# Patient Record
Sex: Female | Born: 1965
Health system: Southern US, Community
[De-identification: ages and names within clinical notes are randomized; demographics above are authoritative.]

## PROBLEM LIST (undated history)

## (undated) ENCOUNTER — Emergency Department (HOSPITAL_COMMUNITY): Admission: EM | Source: Home / Self Care

## (undated) DIAGNOSIS — M1990 Unspecified osteoarthritis, unspecified site: Secondary | ICD-10-CM

## (undated) DIAGNOSIS — F419 Anxiety disorder, unspecified: Secondary | ICD-10-CM

## (undated) DIAGNOSIS — Z800 Family history of malignant neoplasm of digestive organs: Secondary | ICD-10-CM

## (undated) DIAGNOSIS — G8929 Other chronic pain: Secondary | ICD-10-CM

## (undated) DIAGNOSIS — I639 Cerebral infarction, unspecified: Secondary | ICD-10-CM

## (undated) DIAGNOSIS — I509 Heart failure, unspecified: Secondary | ICD-10-CM

## (undated) DIAGNOSIS — I10 Essential (primary) hypertension: Secondary | ICD-10-CM

## (undated) DIAGNOSIS — K3184 Gastroparesis: Secondary | ICD-10-CM

## (undated) DIAGNOSIS — Z720 Tobacco use: Secondary | ICD-10-CM

## (undated) DIAGNOSIS — E271 Primary adrenocortical insufficiency: Secondary | ICD-10-CM

## (undated) DIAGNOSIS — I729 Aneurysm of unspecified site: Secondary | ICD-10-CM

## (undated) DIAGNOSIS — H52209 Unspecified astigmatism, unspecified eye: Secondary | ICD-10-CM

## (undated) DIAGNOSIS — D649 Anemia, unspecified: Secondary | ICD-10-CM

## (undated) DIAGNOSIS — S060XAA Concussion with loss of consciousness status unknown, initial encounter: Secondary | ICD-10-CM

## (undated) DIAGNOSIS — R9431 Abnormal electrocardiogram [ECG] [EKG]: Secondary | ICD-10-CM

## (undated) DIAGNOSIS — E2740 Unspecified adrenocortical insufficiency: Secondary | ICD-10-CM

## (undated) DIAGNOSIS — K529 Noninfective gastroenteritis and colitis, unspecified: Secondary | ICD-10-CM

## (undated) DIAGNOSIS — E785 Hyperlipidemia, unspecified: Secondary | ICD-10-CM

## (undated) DIAGNOSIS — M549 Dorsalgia, unspecified: Secondary | ICD-10-CM

## (undated) DIAGNOSIS — R42 Dizziness and giddiness: Secondary | ICD-10-CM

## (undated) DIAGNOSIS — Z9889 Other specified postprocedural states: Secondary | ICD-10-CM

## (undated) DIAGNOSIS — R7303 Prediabetes: Secondary | ICD-10-CM

## (undated) DIAGNOSIS — R519 Headache, unspecified: Secondary | ICD-10-CM

## (undated) DIAGNOSIS — K219 Gastro-esophageal reflux disease without esophagitis: Secondary | ICD-10-CM

## (undated) DIAGNOSIS — Z803 Family history of malignant neoplasm of breast: Secondary | ICD-10-CM

## (undated) DIAGNOSIS — I469 Cardiac arrest, cause unspecified: Secondary | ICD-10-CM

## (undated) DIAGNOSIS — I2510 Atherosclerotic heart disease of native coronary artery without angina pectoris: Secondary | ICD-10-CM

## (undated) DIAGNOSIS — E039 Hypothyroidism, unspecified: Secondary | ICD-10-CM

## (undated) DIAGNOSIS — R112 Nausea with vomiting, unspecified: Secondary | ICD-10-CM

## (undated) DIAGNOSIS — I341 Nonrheumatic mitral (valve) prolapse: Secondary | ICD-10-CM

## (undated) DIAGNOSIS — S060X9A Concussion with loss of consciousness of unspecified duration, initial encounter: Secondary | ICD-10-CM

## (undated) HISTORY — PX: ABDOMINAL HYSTERECTOMY: SHX81

## (undated) HISTORY — PX: CHOLECYSTECTOMY: SHX55

## (undated) HISTORY — PX: COLONOSCOPY: SHX174

## (undated) HISTORY — PX: VARICOSE VEIN SURGERY: SHX832

## (undated) HISTORY — DX: Abnormal electrocardiogram (ECG) (EKG): R94.31

## (undated) HISTORY — DX: Hypothyroidism, unspecified: E03.9

## (undated) HISTORY — DX: Essential (primary) hypertension: I10

## (undated) HISTORY — DX: Dizziness and giddiness: R42

## (undated) HISTORY — DX: Primary adrenocortical insufficiency: E27.1

## (undated) HISTORY — DX: Gastroparesis: K31.84

## (undated) HISTORY — DX: Unspecified adrenocortical insufficiency: E27.40

## (undated) HISTORY — DX: Atherosclerotic heart disease of native coronary artery without angina pectoris: I25.10

## (undated) HISTORY — DX: Family history of malignant neoplasm of digestive organs: Z80.0

## (undated) HISTORY — DX: Family history of malignant neoplasm of breast: Z80.3

## (undated) HISTORY — DX: Dorsalgia, unspecified: M54.9

## (undated) HISTORY — DX: Other chronic pain: G89.29

## (undated) HISTORY — DX: Hyperlipidemia, unspecified: E78.5

## (undated) HISTORY — DX: Anxiety disorder, unspecified: F41.9

## (undated) HISTORY — PX: VESICOVAGINAL FISTULA CLOSURE W/ TAH: SUR271

## (undated) HISTORY — PX: SPINE SURGERY: SHX786

---

## 1998-04-04 ENCOUNTER — Other Ambulatory Visit: Admission: RE | Admit: 1998-04-04 | Discharge: 1998-04-04 | Payer: Self-pay

## 1999-09-07 HISTORY — PX: NECK SURGERY: SHX720

## 1999-09-17 ENCOUNTER — Encounter (INDEPENDENT_AMBULATORY_CARE_PROVIDER_SITE_OTHER): Payer: Self-pay

## 1999-09-17 ENCOUNTER — Inpatient Hospital Stay (HOSPITAL_COMMUNITY): Admission: EM | Admit: 1999-09-17 | Discharge: 1999-09-18 | Payer: Self-pay | Admitting: Emergency Medicine

## 2000-01-20 ENCOUNTER — Encounter: Payer: Self-pay | Admitting: Family Medicine

## 2000-01-20 ENCOUNTER — Ambulatory Visit (HOSPITAL_COMMUNITY): Admission: RE | Admit: 2000-01-20 | Discharge: 2000-01-20 | Payer: Self-pay | Admitting: Family Medicine

## 2000-06-16 ENCOUNTER — Emergency Department (HOSPITAL_COMMUNITY): Admission: EM | Admit: 2000-06-16 | Discharge: 2000-06-16 | Payer: Self-pay | Admitting: Emergency Medicine

## 2000-06-16 ENCOUNTER — Encounter: Payer: Self-pay | Admitting: Emergency Medicine

## 2000-09-22 ENCOUNTER — Encounter: Admission: RE | Admit: 2000-09-22 | Discharge: 2000-09-22 | Payer: Self-pay | Admitting: Family Medicine

## 2000-09-22 ENCOUNTER — Encounter: Payer: Self-pay | Admitting: Family Medicine

## 2000-10-13 ENCOUNTER — Ambulatory Visit (HOSPITAL_COMMUNITY): Admission: RE | Admit: 2000-10-13 | Discharge: 2000-10-13 | Payer: Self-pay | Admitting: Obstetrics and Gynecology

## 2000-11-28 ENCOUNTER — Encounter: Payer: Self-pay | Admitting: Neurosurgery

## 2000-11-28 ENCOUNTER — Inpatient Hospital Stay (HOSPITAL_COMMUNITY): Admission: RE | Admit: 2000-11-28 | Discharge: 2000-11-29 | Payer: Self-pay | Admitting: Neurosurgery

## 2000-12-12 ENCOUNTER — Encounter: Admission: RE | Admit: 2000-12-12 | Discharge: 2000-12-12 | Payer: Self-pay | Admitting: Neurosurgery

## 2000-12-12 ENCOUNTER — Encounter: Payer: Self-pay | Admitting: Neurosurgery

## 2001-01-12 ENCOUNTER — Encounter: Payer: Self-pay | Admitting: Neurosurgery

## 2001-01-12 ENCOUNTER — Encounter: Admission: RE | Admit: 2001-01-12 | Discharge: 2001-01-12 | Payer: Self-pay | Admitting: Neurosurgery

## 2001-06-18 ENCOUNTER — Emergency Department (HOSPITAL_COMMUNITY): Admission: EM | Admit: 2001-06-18 | Discharge: 2001-06-18 | Payer: Self-pay

## 2001-06-18 ENCOUNTER — Encounter: Payer: Self-pay | Admitting: Emergency Medicine

## 2001-11-13 ENCOUNTER — Emergency Department (HOSPITAL_COMMUNITY): Admission: EM | Admit: 2001-11-13 | Discharge: 2001-11-13 | Payer: Self-pay

## 2002-01-03 ENCOUNTER — Encounter: Admission: RE | Admit: 2002-01-03 | Discharge: 2002-01-03 | Payer: Self-pay

## 2002-02-01 ENCOUNTER — Other Ambulatory Visit: Admission: RE | Admit: 2002-02-01 | Discharge: 2002-02-01 | Payer: Self-pay | Admitting: *Deleted

## 2002-04-05 ENCOUNTER — Emergency Department (HOSPITAL_COMMUNITY): Admission: EM | Admit: 2002-04-05 | Discharge: 2002-04-05 | Payer: Self-pay | Admitting: *Deleted

## 2002-04-13 ENCOUNTER — Emergency Department (HOSPITAL_COMMUNITY): Admission: EM | Admit: 2002-04-13 | Discharge: 2002-04-13 | Payer: Self-pay | Admitting: Emergency Medicine

## 2002-04-22 ENCOUNTER — Emergency Department (HOSPITAL_COMMUNITY): Admission: EM | Admit: 2002-04-22 | Discharge: 2002-04-22 | Payer: Self-pay | Admitting: Emergency Medicine

## 2002-10-30 ENCOUNTER — Other Ambulatory Visit: Admission: RE | Admit: 2002-10-30 | Discharge: 2002-10-30 | Payer: Self-pay | Admitting: Internal Medicine

## 2002-11-02 ENCOUNTER — Encounter: Admission: RE | Admit: 2002-11-02 | Discharge: 2002-11-02 | Payer: Self-pay | Admitting: Internal Medicine

## 2002-11-02 ENCOUNTER — Encounter: Payer: Self-pay | Admitting: Internal Medicine

## 2003-01-30 ENCOUNTER — Other Ambulatory Visit: Admission: RE | Admit: 2003-01-30 | Discharge: 2003-01-30 | Payer: Self-pay | Admitting: *Deleted

## 2003-02-27 ENCOUNTER — Emergency Department (HOSPITAL_COMMUNITY): Admission: EM | Admit: 2003-02-27 | Discharge: 2003-02-27 | Payer: Self-pay | Admitting: Emergency Medicine

## 2003-02-27 ENCOUNTER — Encounter: Payer: Self-pay | Admitting: Emergency Medicine

## 2003-12-06 ENCOUNTER — Emergency Department (HOSPITAL_COMMUNITY): Admission: EM | Admit: 2003-12-06 | Discharge: 2003-12-06 | Payer: Self-pay | Admitting: Emergency Medicine

## 2004-01-17 ENCOUNTER — Emergency Department (HOSPITAL_COMMUNITY): Admission: EM | Admit: 2004-01-17 | Discharge: 2004-01-18 | Payer: Self-pay | Admitting: Emergency Medicine

## 2004-01-30 ENCOUNTER — Other Ambulatory Visit: Admission: RE | Admit: 2004-01-30 | Discharge: 2004-01-30 | Payer: Self-pay | Admitting: *Deleted

## 2004-02-10 ENCOUNTER — Emergency Department (HOSPITAL_COMMUNITY): Admission: EM | Admit: 2004-02-10 | Discharge: 2004-02-10 | Payer: Self-pay | Admitting: Emergency Medicine

## 2004-02-28 ENCOUNTER — Emergency Department (HOSPITAL_COMMUNITY): Admission: EM | Admit: 2004-02-28 | Discharge: 2004-02-28 | Payer: Self-pay | Admitting: Emergency Medicine

## 2004-08-04 ENCOUNTER — Encounter: Admission: RE | Admit: 2004-08-04 | Discharge: 2004-08-04 | Payer: Self-pay | Admitting: Internal Medicine

## 2004-08-09 ENCOUNTER — Emergency Department (HOSPITAL_COMMUNITY): Admission: EM | Admit: 2004-08-09 | Discharge: 2004-08-09 | Payer: Self-pay | Admitting: Emergency Medicine

## 2004-08-16 IMAGING — US US PELVIS COMPLETE MODIFY
1 series · 14 of 25 positions shown · non-contrast
Comparison: none

CLINICAL DATA: 37 year old with right lower quadrant abdominal pain.  History of endometriosis.
 TRANSABDOMINAL AND ENDOVAGINAL PELVIC ULTRASOUND 
 Transabdominal and endovaginal images are performed of the pelvis.  The uterus is 6.8 x 3.3 x 3.5 cm.  The endometrium is 8 mm.  Within the endometrium, small hyperechoic foci are identified.  These do not represent calcifications based on the CT examination of the same day.  These could represent focal areas of fibrosis within the canal.  Otherwise the endometrium has a normal appearance.  
 The right ovary is 1.8 x 1.5 x 1.6 cm.  The left ovary is 2.3 x 1.5 x 1.6 cm.  No free pelvic fluid is identified.
 IMPRESSION
 1.  Normal appearing ovaries.  
 2.  Small hyperechoic foci within the endometrial canal may represent focal areas of fibrosis.  No other evidence for an endometrial lesion.

[Series 1: unknown · 0.32mm/px · 14 of 58 slices shown]
[im 1/58]
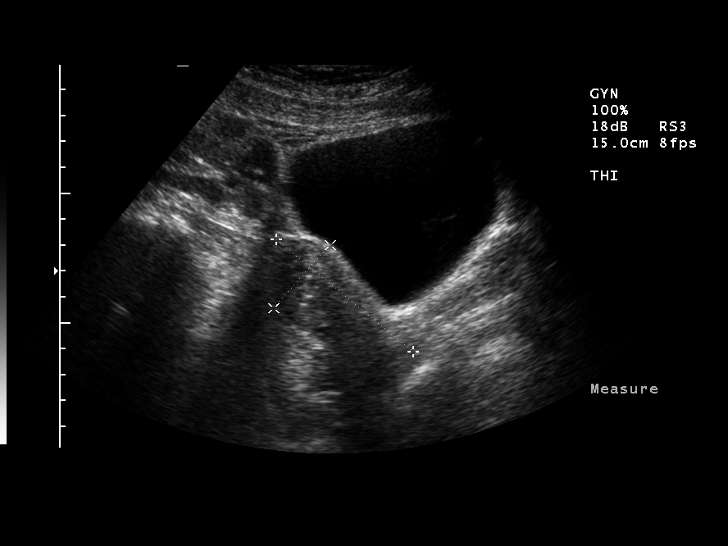
[im 5/58]
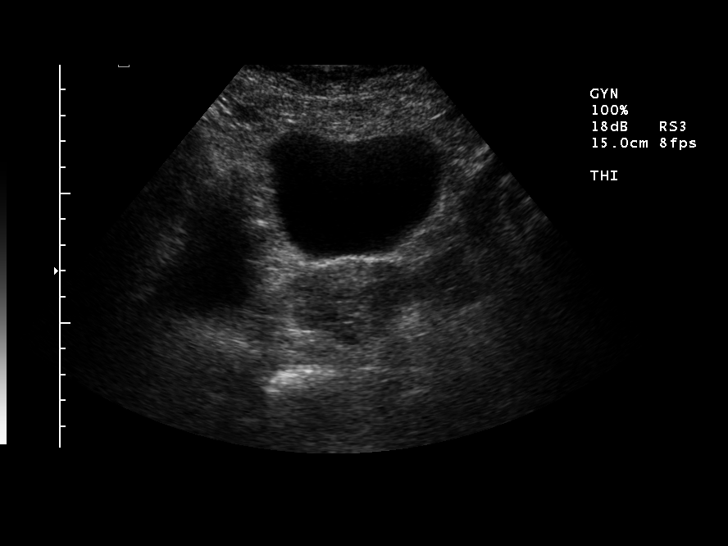
[im 10/58]
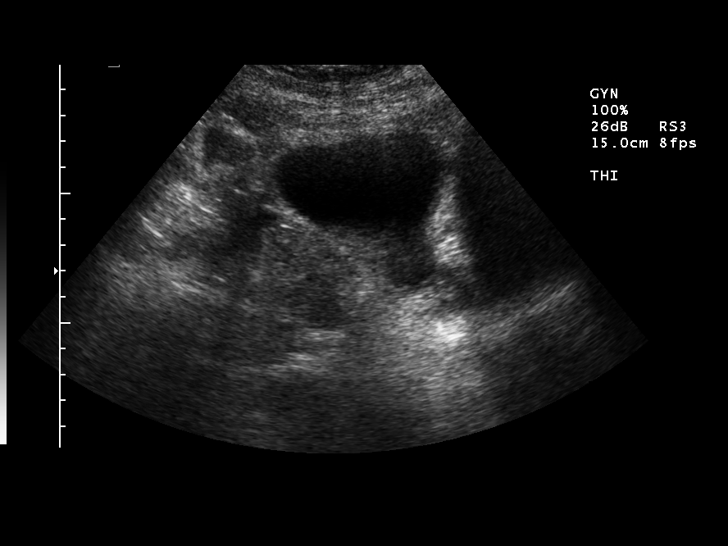
[im 15/58]
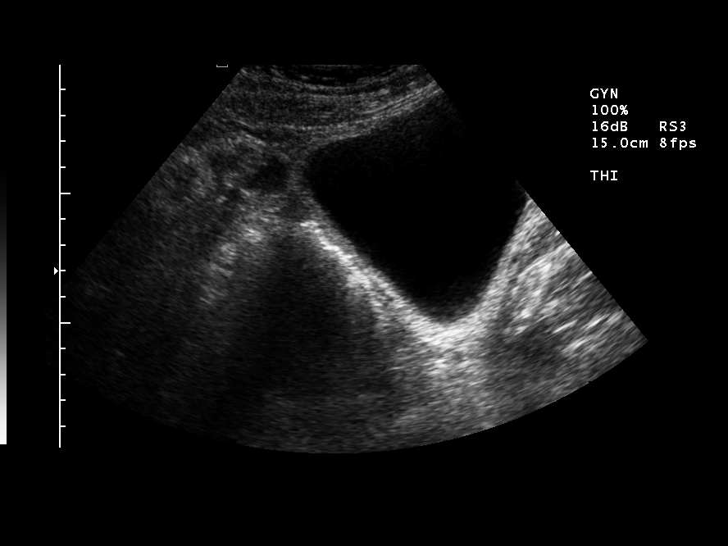
[im 20/58]
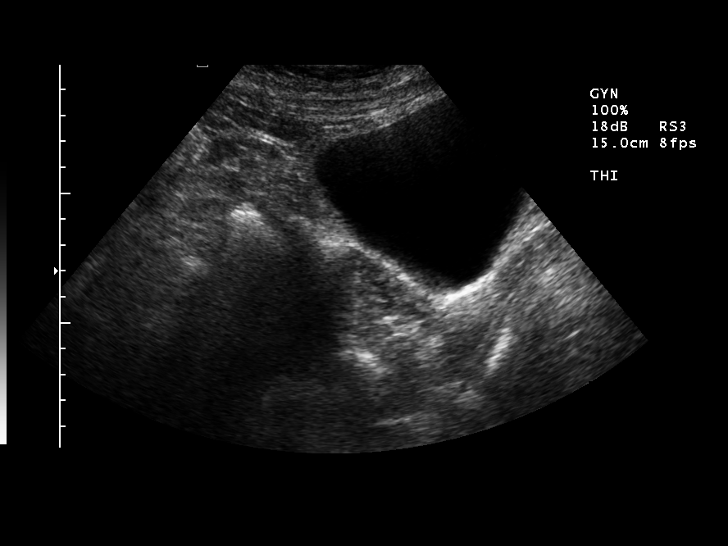
[im 22/58]
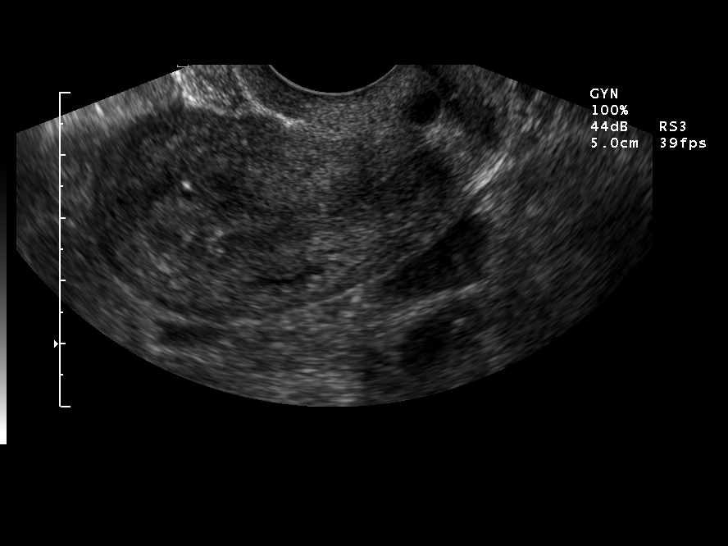
[im 27/58]
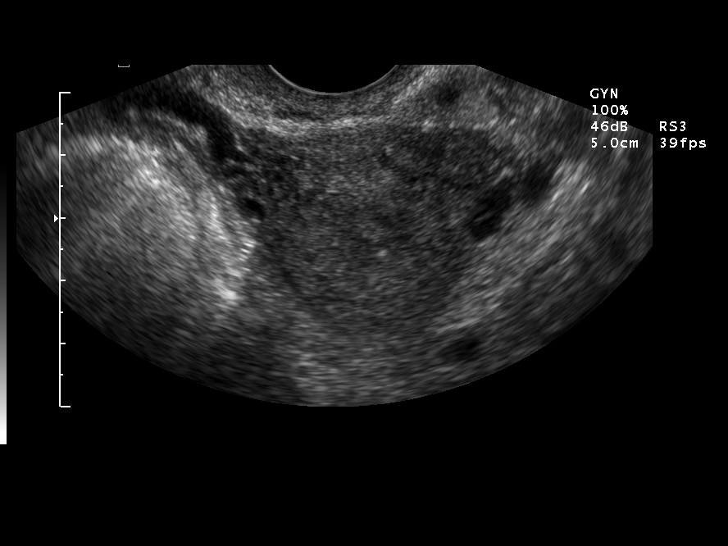
[im 31/58]
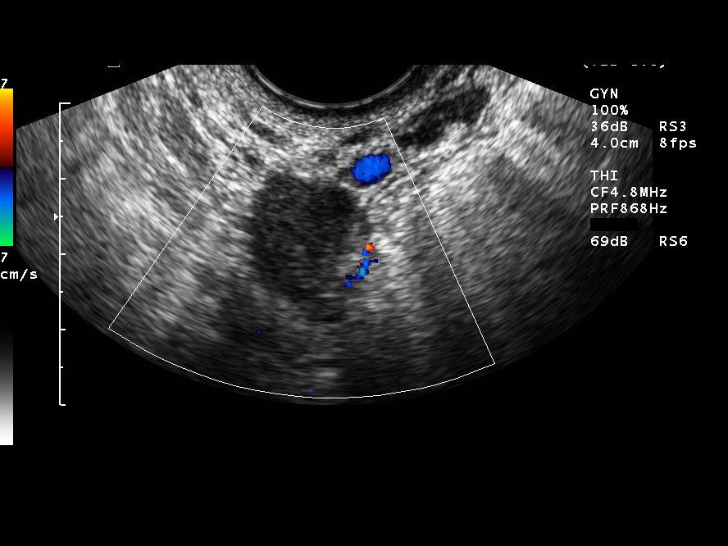
[im 36/58]
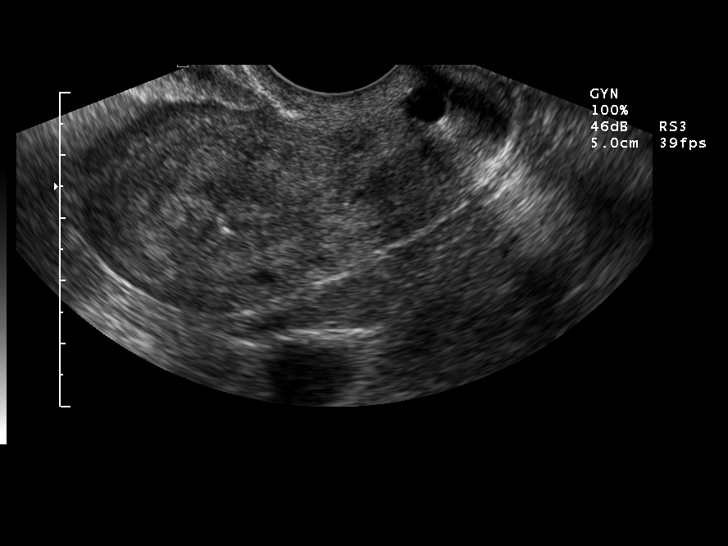
[im 39/58]
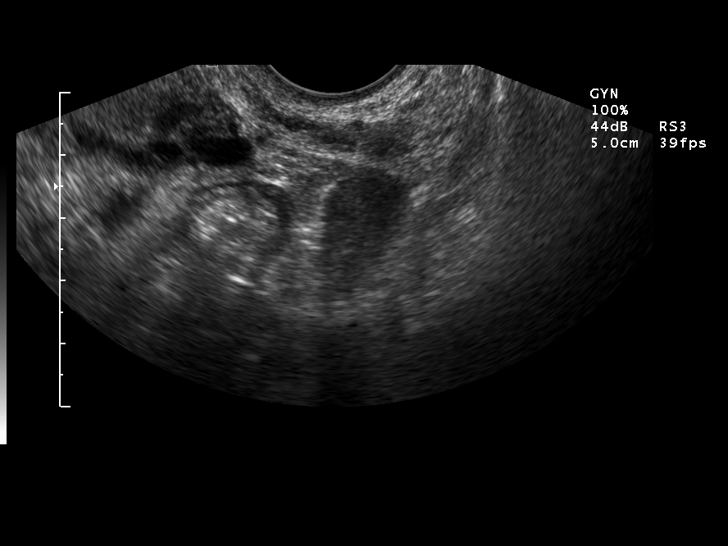
[im 43/58]
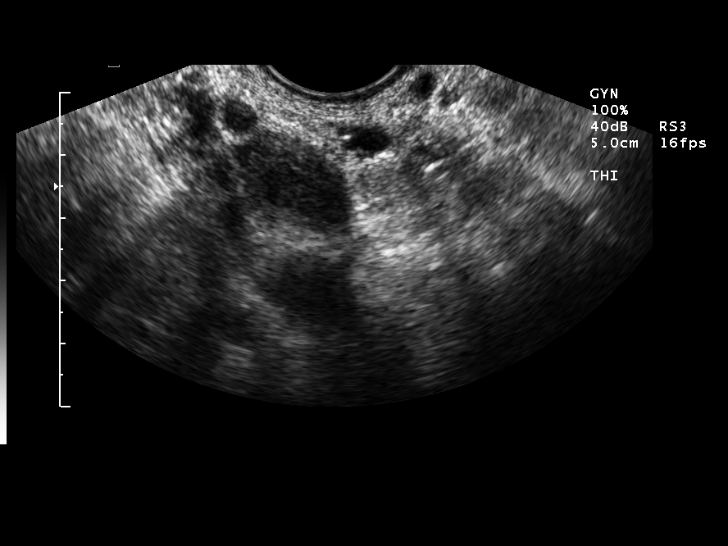
[im 48/58]
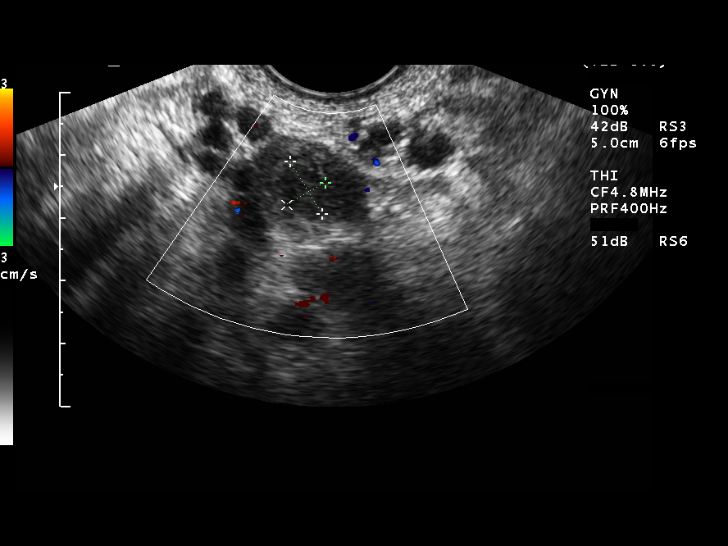
[im 53/58]
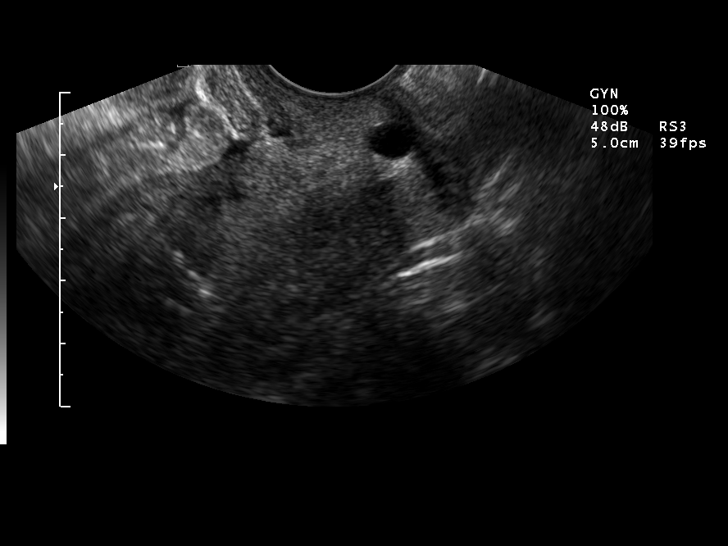
[im 58/58]
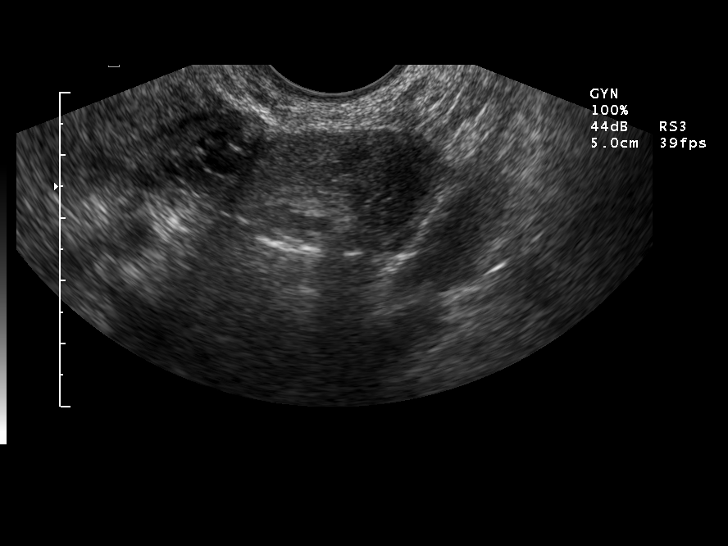

[14 of 25 positions shown; findings below may reference images not displayed]

## 2004-08-16 IMAGING — CT CT ABDOMEN W/O CM
1 of 3 series · 15 of 32 positions shown, 20 images · non-contrast
Comparison: none

CLINICAL DATA: Patient with right abdominal pain.
 CT SCAN OF THE ABDOMEN WITHOUT CONTRAST
 Multiple spiral images were made through abdomen with no oral or IV contrast.  The lung bases are normal.  There is no abnormality of the liver or spleen.  The kidneys are normal with no obstruction.  There is no free air or free fluid.
 IMPRESSION
 Normal CT scan of the abdomen without contrast.
 CT SCAN OF THE PELVIS WITHOUT CONTRAST
 Additional spiral images were made through the pelvis with no oral or IV contrast.  There are no ureteral stones.  The appendix is normal.  No inflammation is noted.
 1.  Normal appendix.
 2.  No acute abnormality within the pelvis.

[Series 3: kidney sto 5.0 b30f · axial · 0.66mm/px · z∈[+1242,+1670]mm · 15 of 119 slices shown, 20 images]
[im 6/119  soft-tissue]
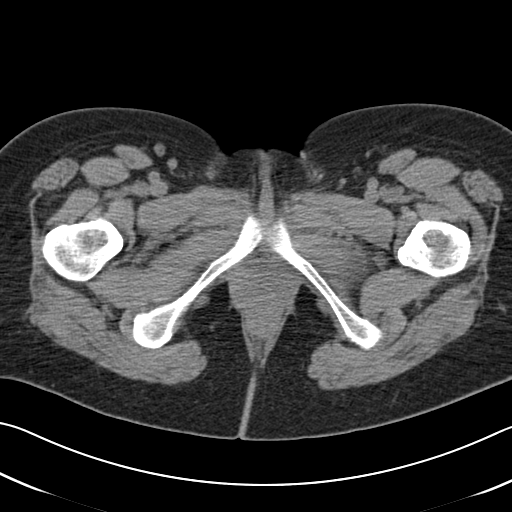
[im 6/119  bone]
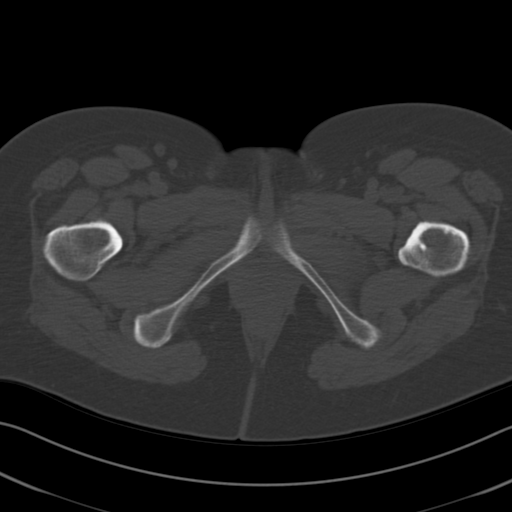
[im 16/119  soft-tissue]
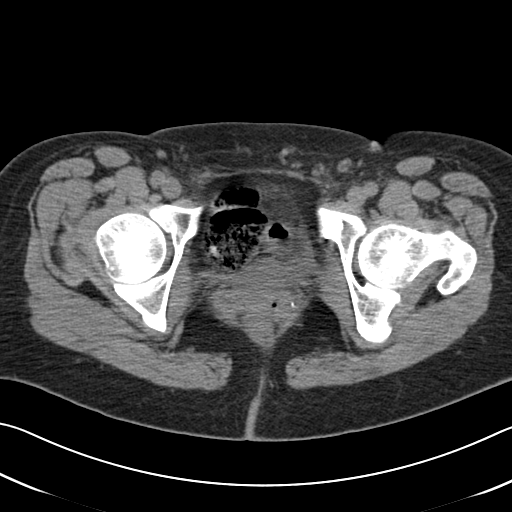
[im 21/119  soft-tissue]
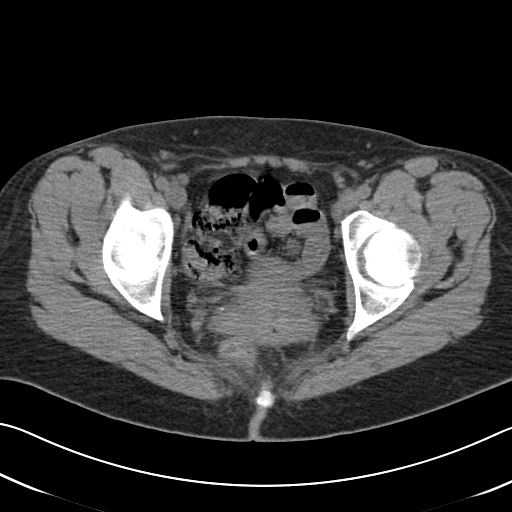
[im 31/119  soft-tissue]
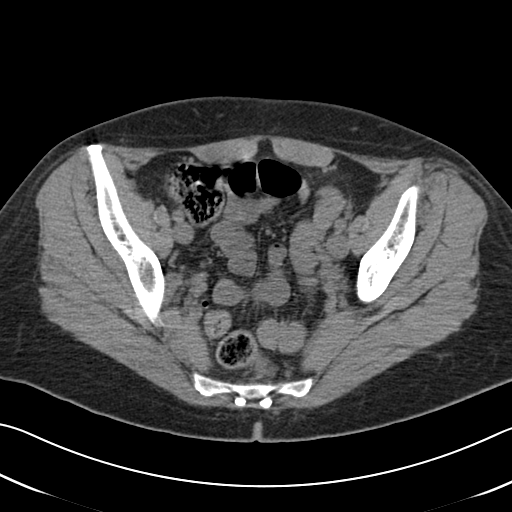
[im 42/119  soft-tissue]
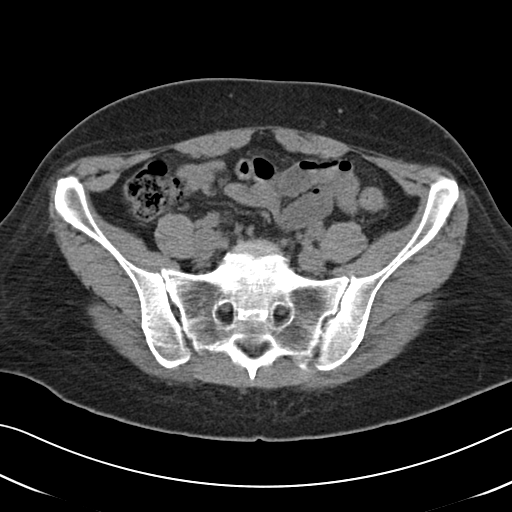
[im 47/119  soft-tissue]
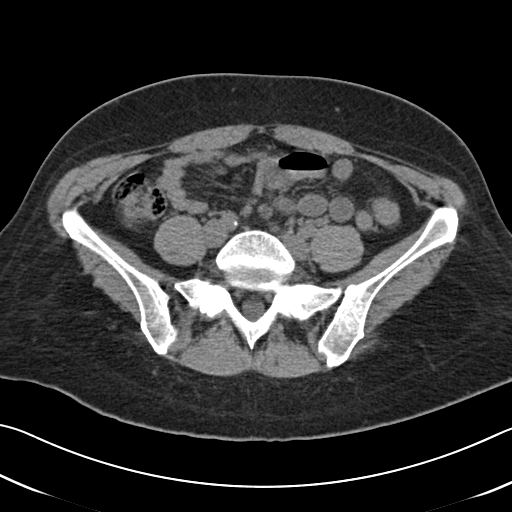
[im 57/119  soft-tissue]
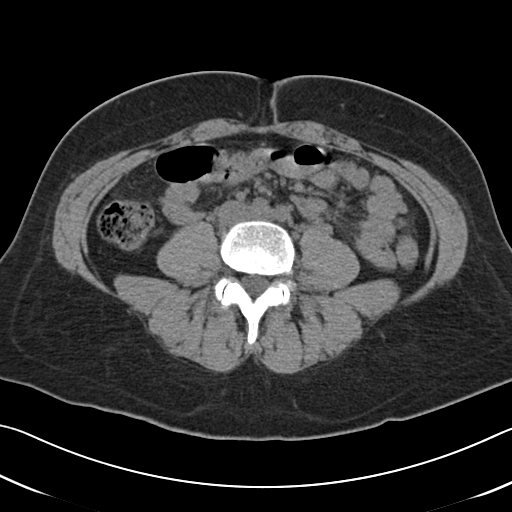
[im 62/119  soft-tissue]
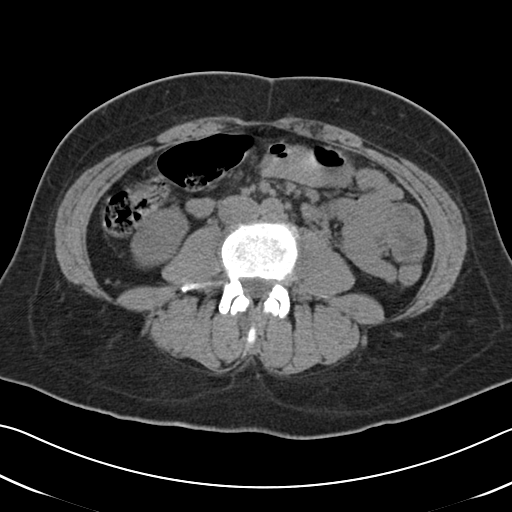
[im 72/119  soft-tissue]
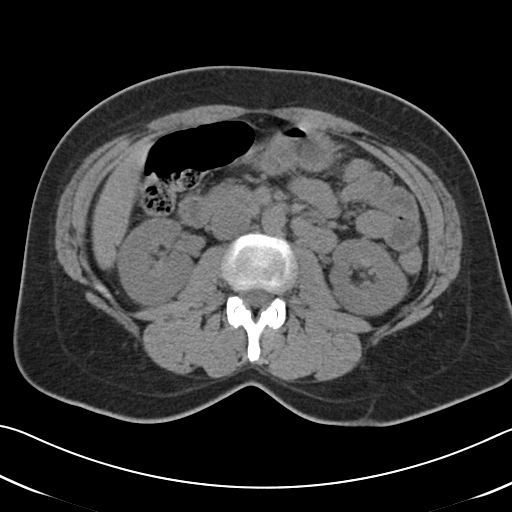
[im 72/119  bone]
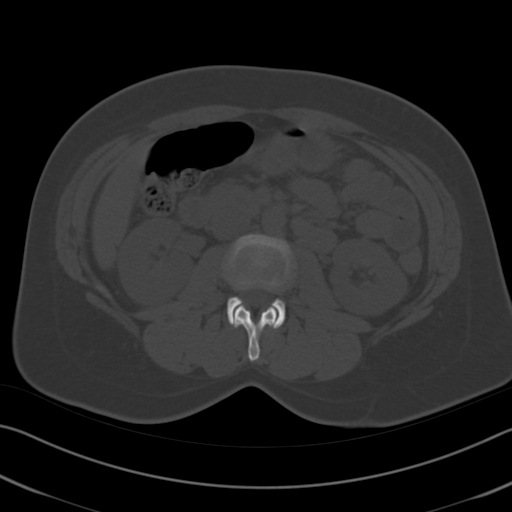
[im 77/119  soft-tissue]
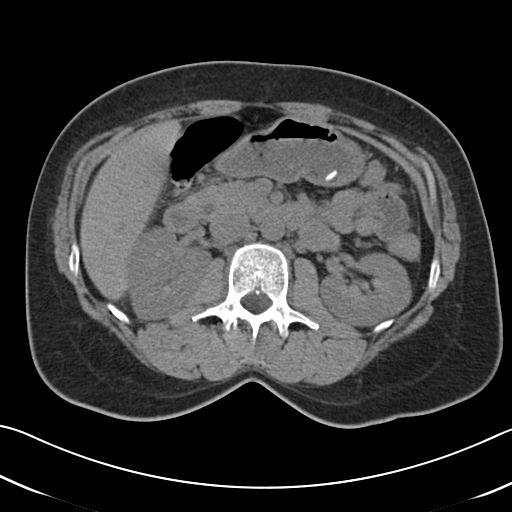
[im 88/119  soft-tissue]
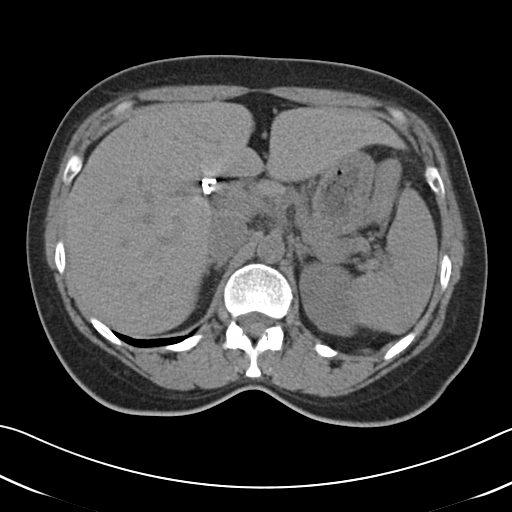
[im 98/119  soft-tissue]
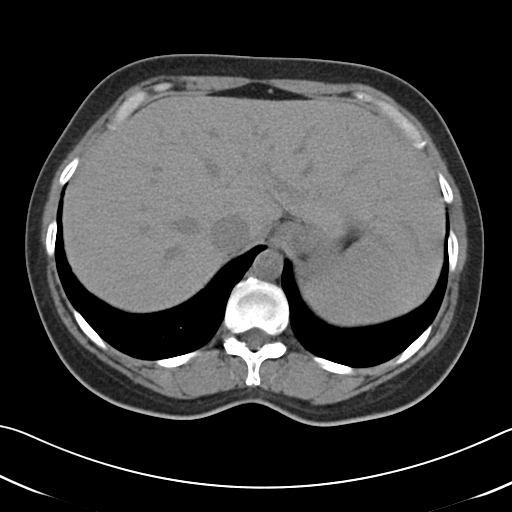
[im 98/119  lung]
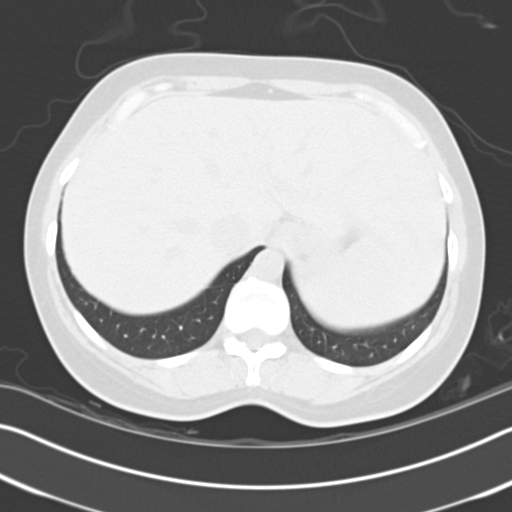
[im 103/119  soft-tissue]
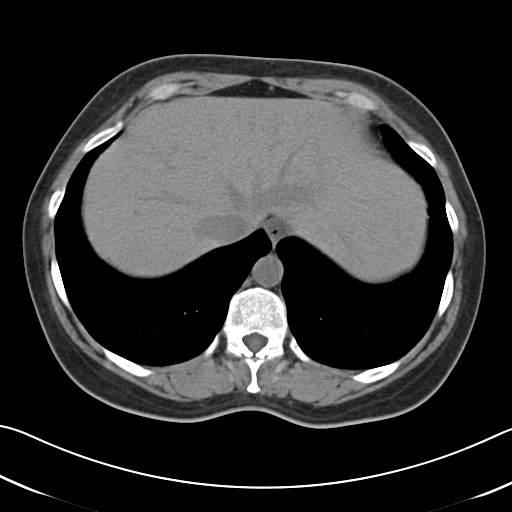
[im 103/119  lung]
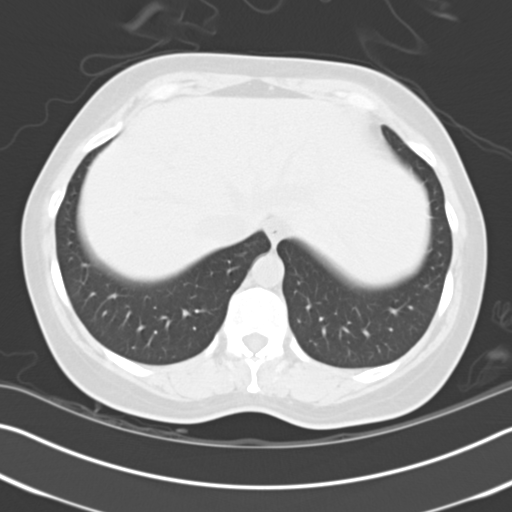
[im 108/119  lung]
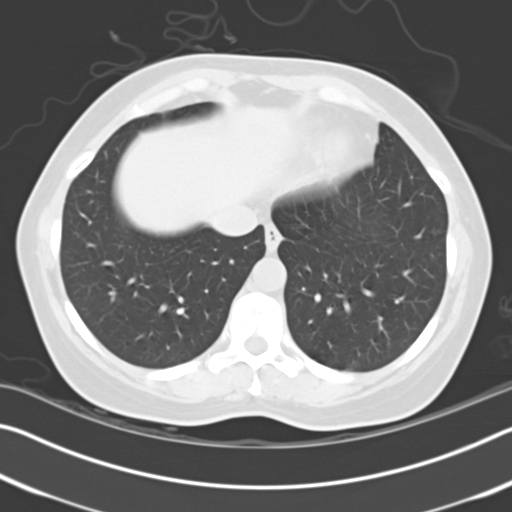
[im 113/119  soft-tissue]
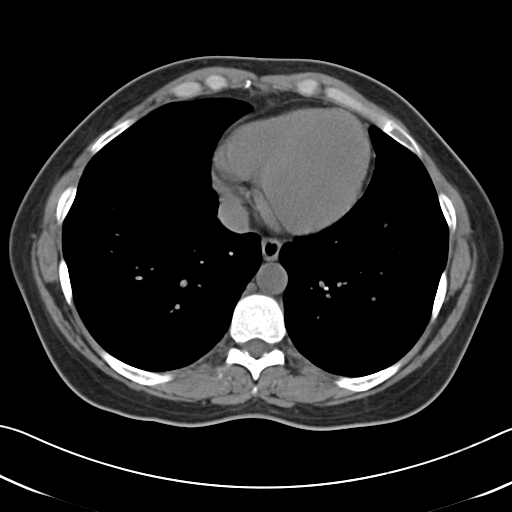
[im 113/119  lung]
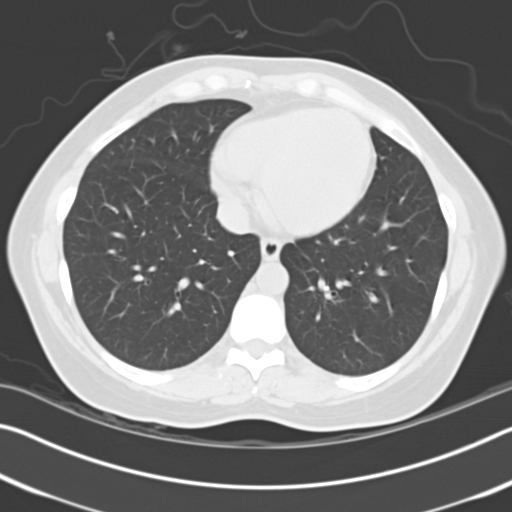

[15 of 32 positions shown; findings below may reference images not displayed]

## 2004-09-05 ENCOUNTER — Encounter: Admission: RE | Admit: 2004-09-05 | Discharge: 2004-09-05 | Payer: Self-pay | Admitting: Sports Medicine

## 2004-09-28 IMAGING — CT CT ABDOMEN W/ CM
1 of 4 series · 14 of 32 positions shown, 19 images · IV contrast (omnipaque)
Comparison: none

CLINICAL DATA: Abdominal pain.  
 CT ABDOMEN AND PELVIS WITH CONTRAST 
 Multidetector helical CT imaging of the abdomen and pelvis performed following diluted oral contrast and 145 cc Omnipaque 300.  Comparison 12/06/03.

[Series 2: abd/pelvis 5.0 b30f · axial · 0.64mm/px · z∈[-510,-110]mm · 14 of 92 slices shown, 19 images]
[im 6/92  soft-tissue]
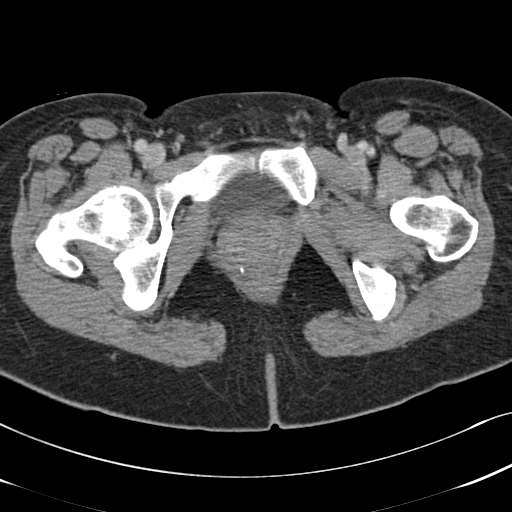
[im 6/92  bone]
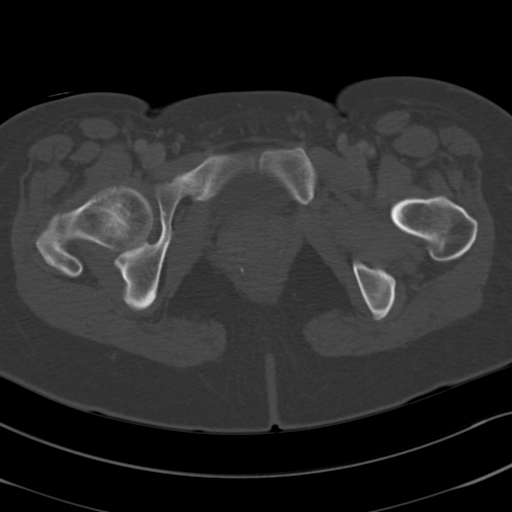
[im 12/92  soft-tissue]
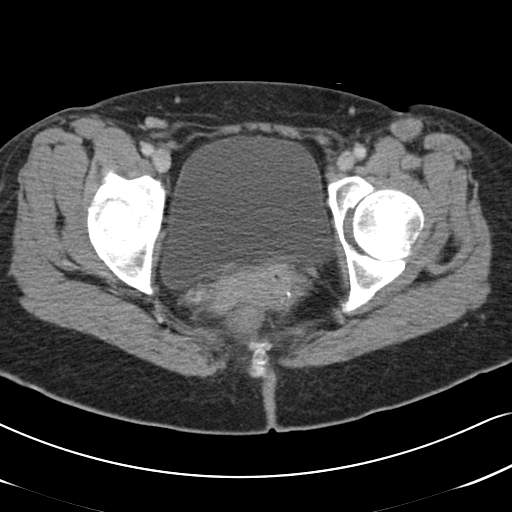
[im 18/92  soft-tissue]
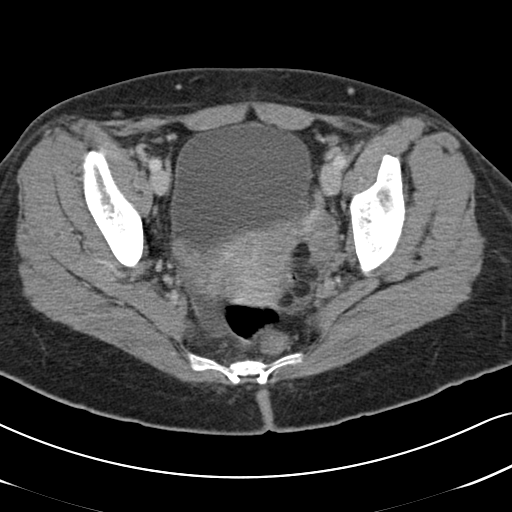
[im 29/92  soft-tissue]
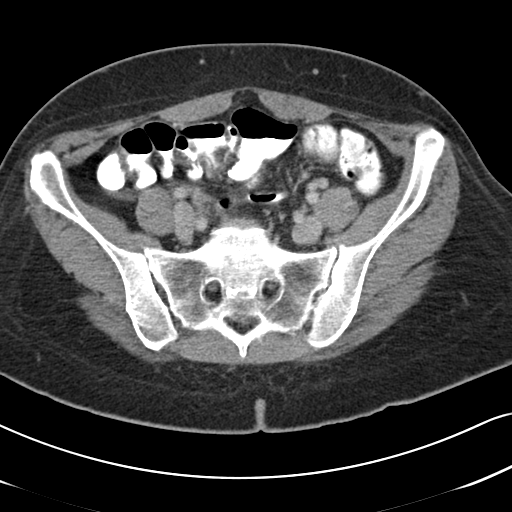
[im 35/92  soft-tissue]
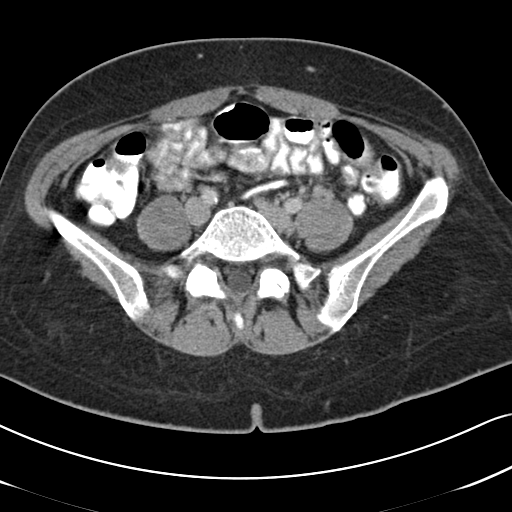
[im 40/92  soft-tissue]
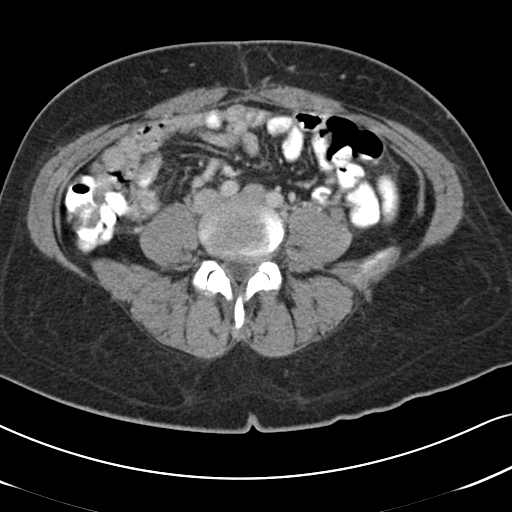
[im 46/92  soft-tissue]
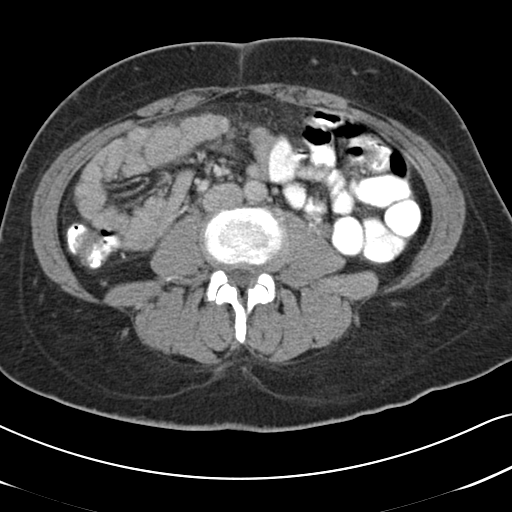
[im 52/92  soft-tissue]
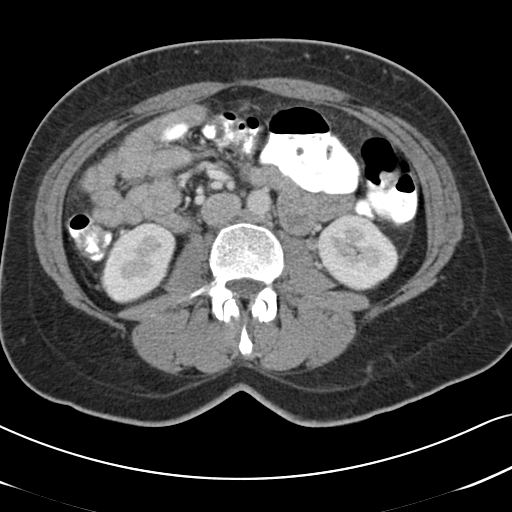
[im 57/92  soft-tissue]
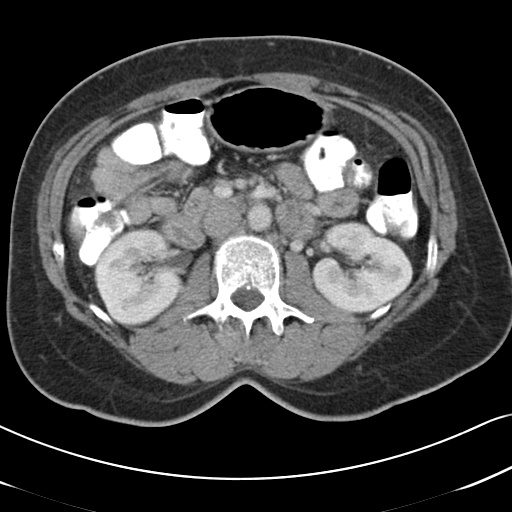
[im 57/92  bone]
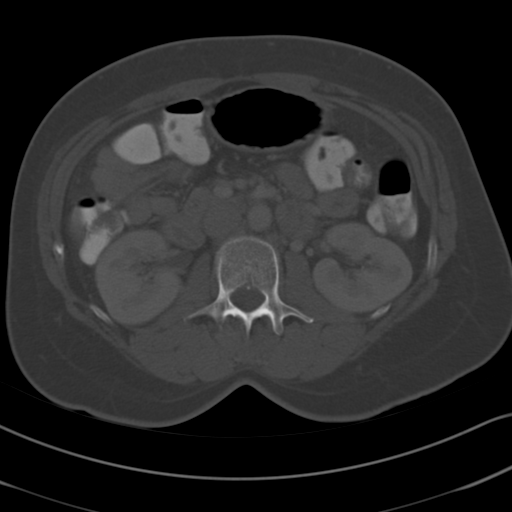
[im 63/92  soft-tissue]
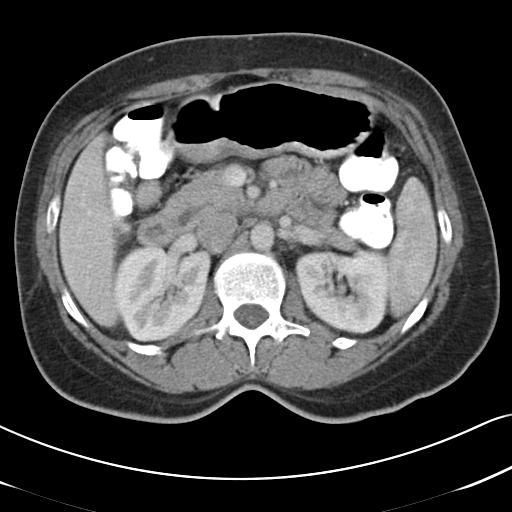
[im 69/92  lung]
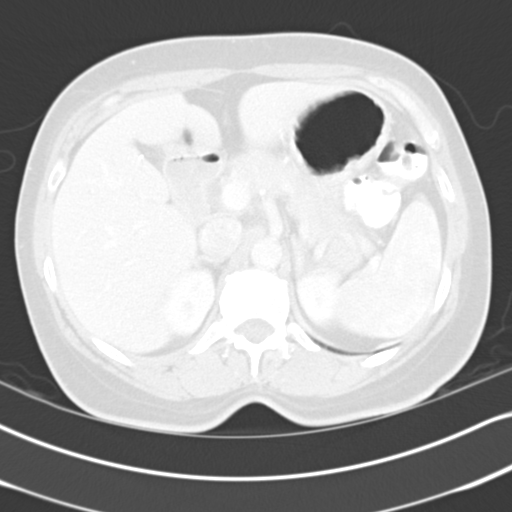
[im 74/92  soft-tissue]
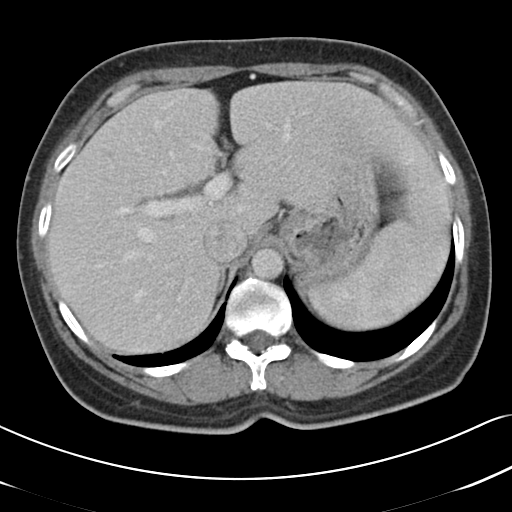
[im 74/92  lung]
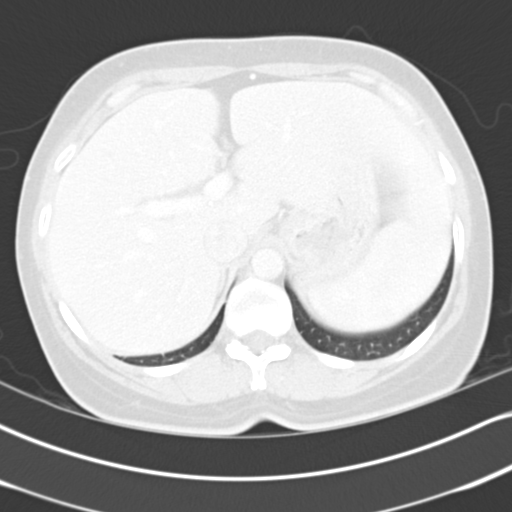
[im 80/92  soft-tissue]
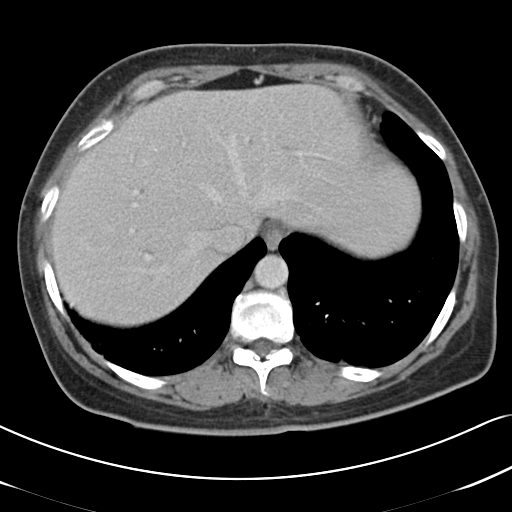
[im 80/92  lung]
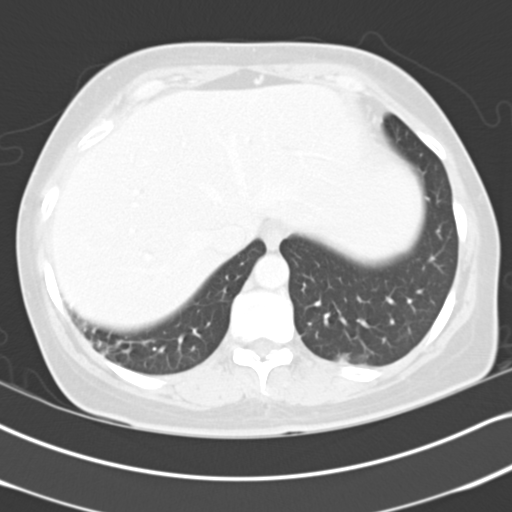
[im 86/92  soft-tissue]
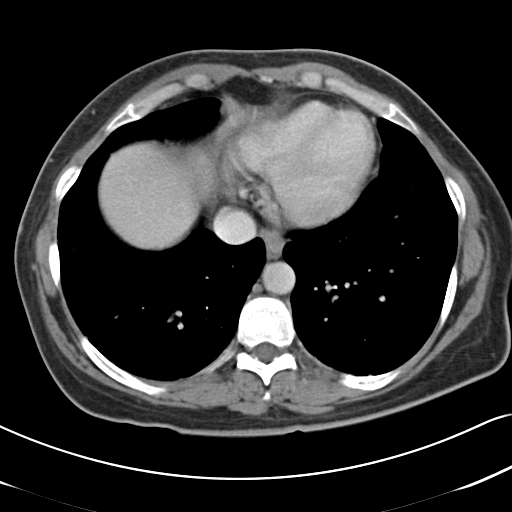
[im 86/92  lung]
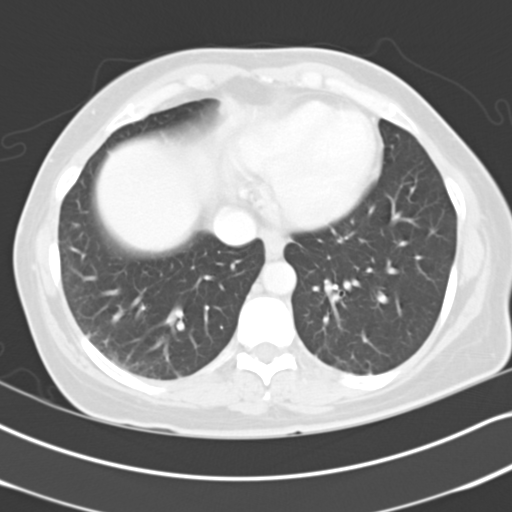

[14 of 32 positions shown; findings below may reference images not displayed]

FINDINGS: Lung bases clear.  Status post cholecystectomy.  Minimal central biliary dilatation probable related to prior cholecystectomy.  Liver, spleen, pancreas, kidneys and adrenal glands normal appearance.  No mass, adenopathy, free fluid or inflammatory process. 
 IMPRESSION
 Cholecystectomy.  No acute abnormalities. 
 CT PELVIS
 Redundant cecum which lies at mid line.  Normal appendix, located at mid line.  No pelvic mass or adenopathy.  Small amount of free pelvic fluid is seen, nonspecific.  Bladder unremarkable.  Uterus normal size.  
 IMPRESSION
 Small amount of nonspecific free pelvic fluid.  Otherwise negative exam.
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
## 2004-11-22 ENCOUNTER — Emergency Department (HOSPITAL_COMMUNITY): Admission: EM | Admit: 2004-11-22 | Discharge: 2004-11-23 | Payer: Self-pay | Admitting: Emergency Medicine

## 2005-02-11 ENCOUNTER — Other Ambulatory Visit: Admission: RE | Admit: 2005-02-11 | Discharge: 2005-02-11 | Payer: Self-pay | Admitting: *Deleted

## 2005-03-04 ENCOUNTER — Encounter: Admission: RE | Admit: 2005-03-04 | Discharge: 2005-03-04 | Payer: Self-pay | Admitting: Internal Medicine

## 2005-04-15 IMAGING — MR MR CERVICAL SPINE W/O CM
4 of 6 series · 25 of 48 positions shown · IV contrast (agent unspecified)
Comparison: none

CLINICAL DATA: Right-sided radicular symptoms with numbness and tingling down from the right neck. 
 MRI OF THE CERVICAL SPINE WITHOUT CONTRAST:
 Comparing to prior MRI dated January 03, 2002.

[Series 8: [id number]_rst_sag_s3_(id) · sagittal · 3.0mm · 0.43mm/px · 6 of 11 slices shown]
[im 1/11]
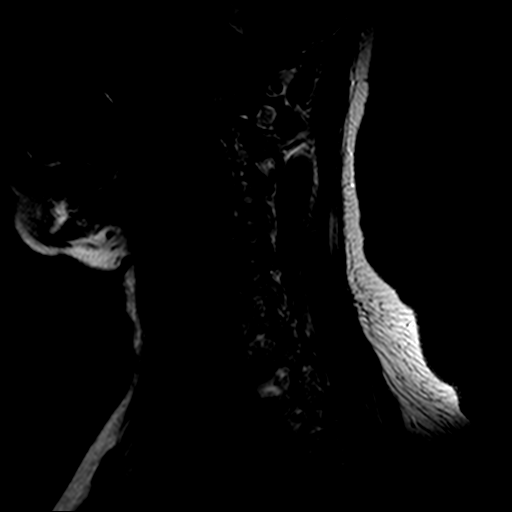
[im 3/11]
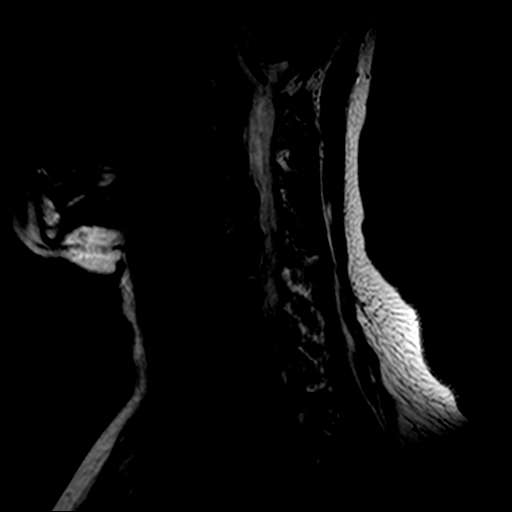
[im 5/11]
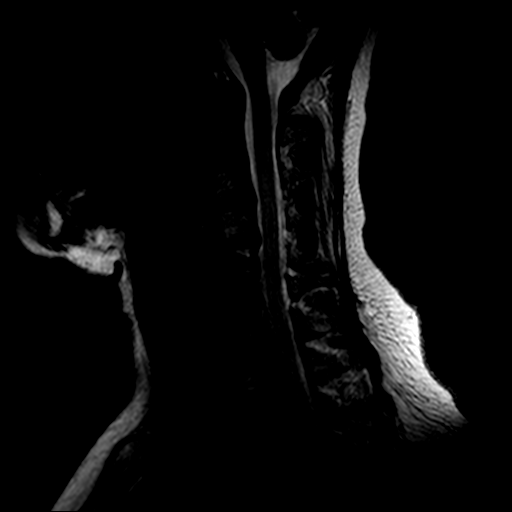
[im 7/11]
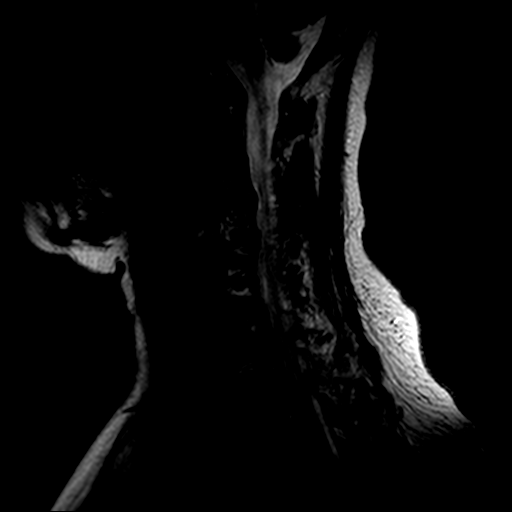
[im 9/11]
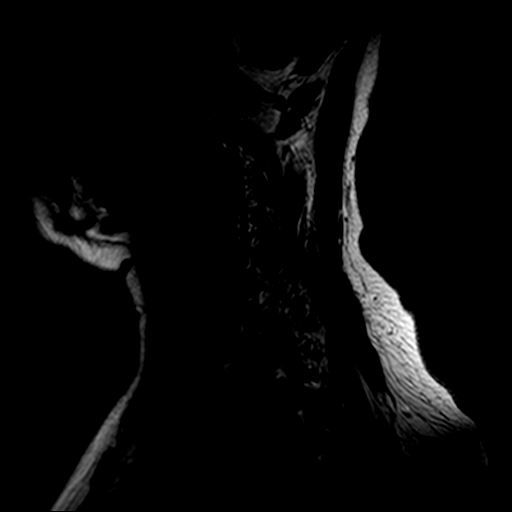
[im 11/11]
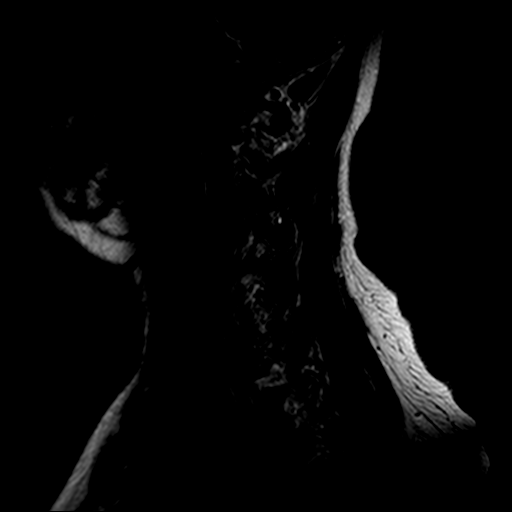

[Series 9: t1_tse_sag_s4_(id) · sagittal · 3.0mm · 0.43mm/px · 5 of 11 slices shown]
[im 1/11]
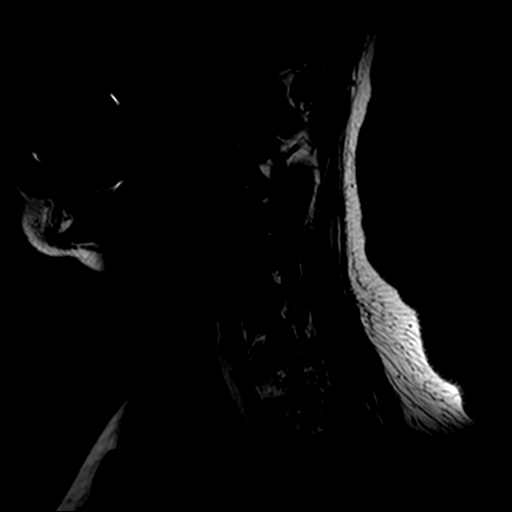
[im 3/11]
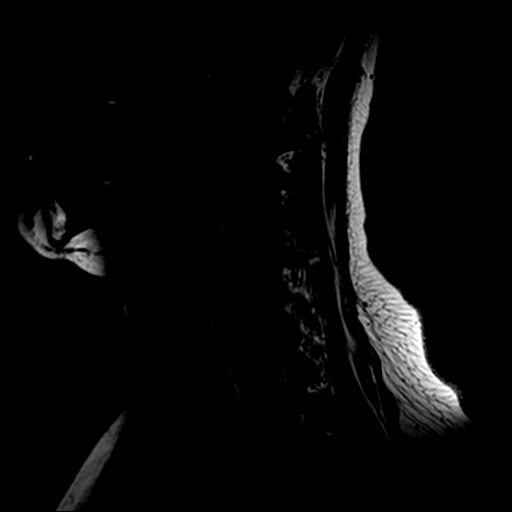
[im 5/11]
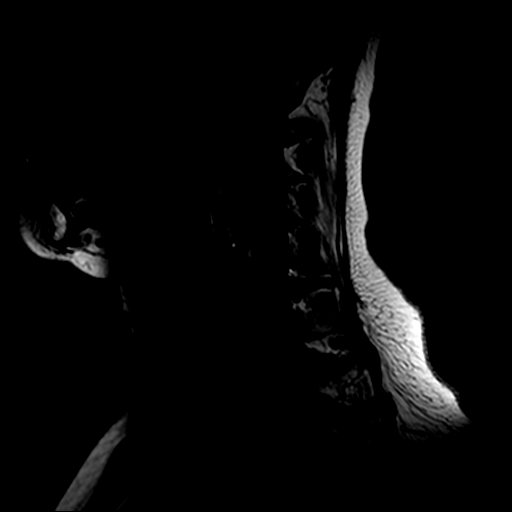
[im 7/11]
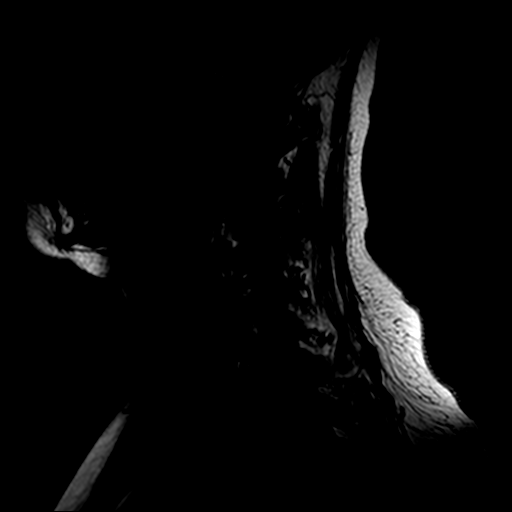
[im 11/11]
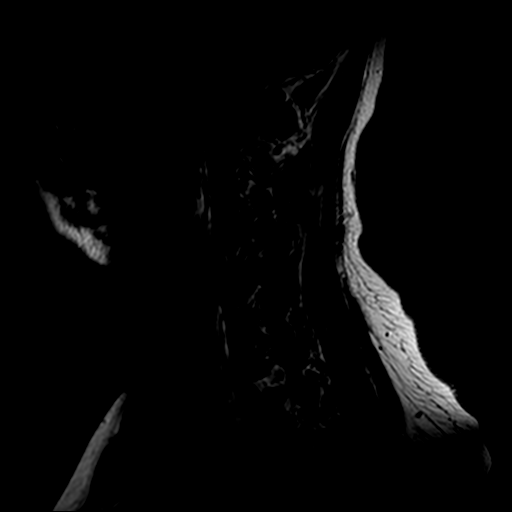

[Series 10: pd_tse_sag_s5_(id) · sagittal · 3.0mm · 0.43mm/px · 3 of 11 slices shown]
[im 2/11]
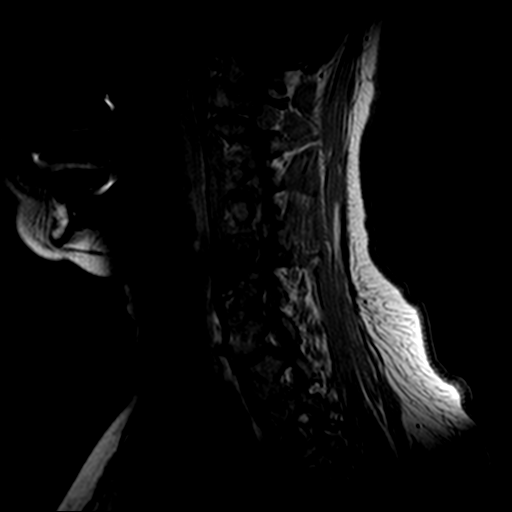
[im 6/11]
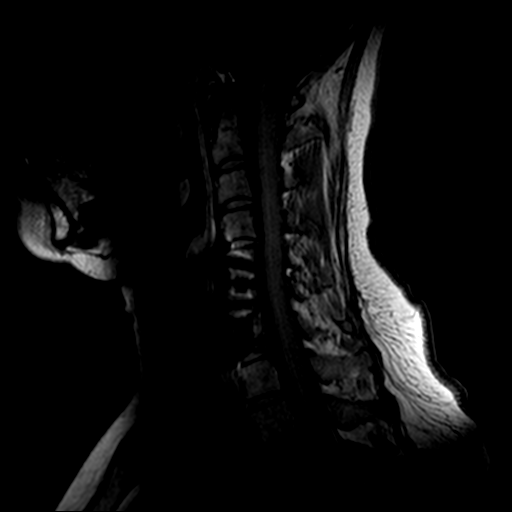
[im 9/11]
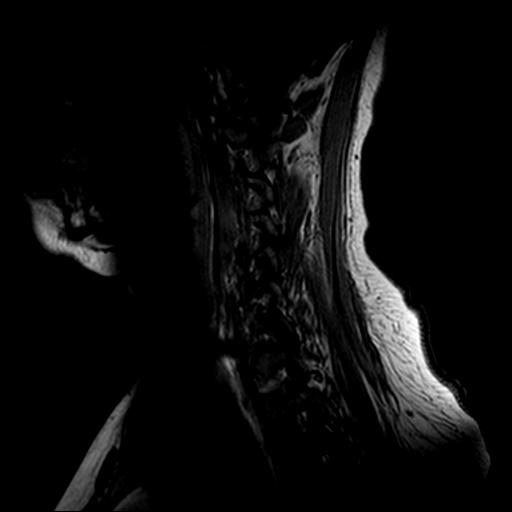

[Series 12: T2 · axial · 4.0mm · 0.62mm/px · z∈[-81,-8]mm · 11 of 18 slices shown]
[im 1/18]
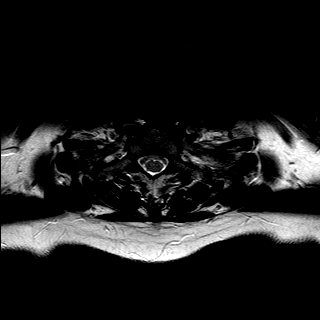
[im 2/18]
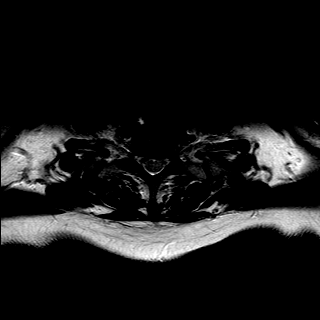
[im 4/18]
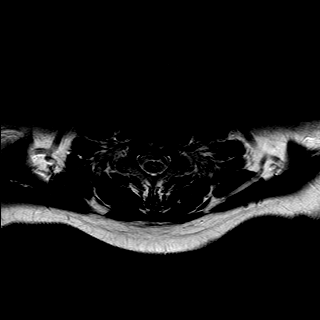
[im 6/18]
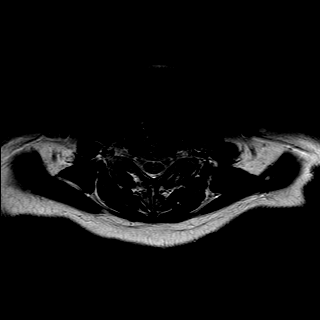
[im 7/18]
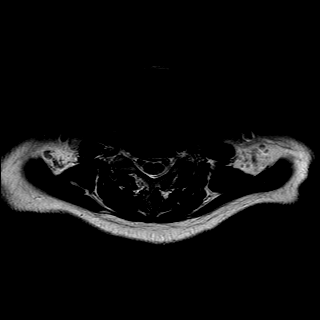
[im 9/18]
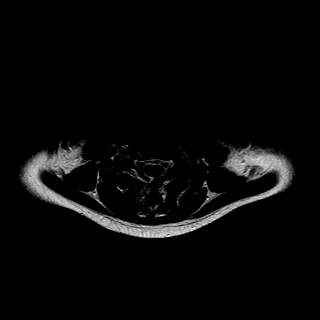
[im 11/18]
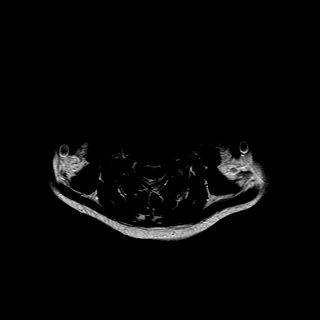
[im 12/18]
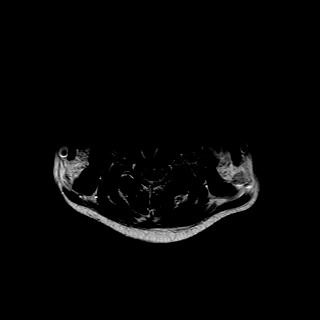
[im 14/18]
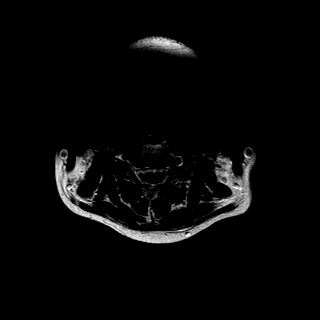
[im 16/18]
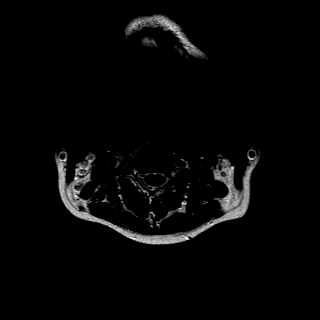
[im 18/18]
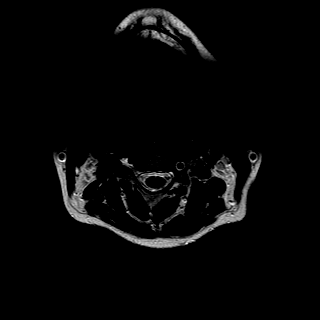

[25 of 48 positions shown; findings below may reference images not displayed]

FINDINGS: Multiplanar, multisequence MR images were obtained through the cervical spine. 
 The patient has had prior ACDF at the C[DATE] level.  No definite abnormal T2- signal is evident in the cord.  Sagittal T2-weighted images are mildly limited due to motion artifact and susceptibility artifact.  
 Craniocervical junction appears unremarkable.  There is some loss of the normal cervical lordosis.  Additional findings at individual levels are as follows: 
 C3-4:  Uncovertebral overgrowth is present along with a mild chronic broad based disc bulge.  The result is borderline bilateral foraminal stenosis.  
 C4-5:  Broad based slightly right eccentric disc bulge is present.  The right foraminal component of this disc bulge causes mild to moderate right foraminal stenosis. The disc bulge thins the ventral subarachnoid space at this level. 
 C5-6:  Susceptibility artifact slightly obscures this level.  No discrete or foraminal stenosis.  
 C6-7:  No significant central or foraminal stenosis is identified. 
 C7-T1:  Broad based left eccentric disc bulge is present.  There is mild to moderate left foraminal stenosis due to foraminal component of the disc bulge.
IMPRESSION: 1.  Overall compared to the prior exam there is moderate worsening of the disc pathology at the C4-5 and at the C7-T1 levels.  The uncovertebral overgrowth at C3-4 also appears mildly more advanced.

## 2005-06-03 ENCOUNTER — Ambulatory Visit (HOSPITAL_COMMUNITY): Admission: RE | Admit: 2005-06-03 | Discharge: 2005-06-04 | Payer: Self-pay | Admitting: *Deleted

## 2005-06-03 ENCOUNTER — Encounter (INDEPENDENT_AMBULATORY_CARE_PROVIDER_SITE_OTHER): Payer: Self-pay | Admitting: Specialist

## 2005-07-15 ENCOUNTER — Inpatient Hospital Stay (HOSPITAL_COMMUNITY): Admission: EM | Admit: 2005-07-15 | Discharge: 2005-07-17 | Payer: Self-pay | Admitting: Emergency Medicine

## 2005-07-16 ENCOUNTER — Encounter: Payer: Self-pay | Admitting: Cardiology

## 2005-07-16 ENCOUNTER — Ambulatory Visit: Payer: Self-pay | Admitting: Cardiology

## 2005-07-16 ENCOUNTER — Ambulatory Visit: Payer: Self-pay | Admitting: Internal Medicine

## 2005-08-11 ENCOUNTER — Ambulatory Visit: Payer: Self-pay | Admitting: Internal Medicine

## 2005-08-23 ENCOUNTER — Ambulatory Visit: Payer: Self-pay

## 2005-08-31 ENCOUNTER — Ambulatory Visit: Payer: Self-pay | Admitting: Internal Medicine

## 2005-09-03 ENCOUNTER — Ambulatory Visit: Payer: Self-pay | Admitting: Internal Medicine

## 2005-09-03 ENCOUNTER — Inpatient Hospital Stay (HOSPITAL_BASED_OUTPATIENT_CLINIC_OR_DEPARTMENT_OTHER): Admission: RE | Admit: 2005-09-03 | Discharge: 2005-09-03 | Payer: Self-pay | Admitting: Internal Medicine

## 2005-09-07 ENCOUNTER — Ambulatory Visit: Payer: Self-pay | Admitting: *Deleted

## 2005-09-07 ENCOUNTER — Emergency Department (HOSPITAL_COMMUNITY): Admission: EM | Admit: 2005-09-07 | Discharge: 2005-09-07 | Payer: Self-pay | Admitting: Family Medicine

## 2005-09-17 ENCOUNTER — Ambulatory Visit (HOSPITAL_COMMUNITY): Admission: RE | Admit: 2005-09-17 | Discharge: 2005-09-17 | Payer: Self-pay | Admitting: Emergency Medicine
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
## 2005-12-14 ENCOUNTER — Ambulatory Visit: Payer: Self-pay | Admitting: Internal Medicine

## 2006-01-25 ENCOUNTER — Ambulatory Visit: Payer: Self-pay | Admitting: *Deleted

## 2006-02-11 IMAGING — CR DG CHEST 2V
2 series · 2 of 2 positions shown · non-contrast
Comparison: none

CLINICAL DATA: Abnormal uterine bleeding, hypertension.   Pre-op work-up.
 CHEST - 2 VIEW:
 The heart size and mediastinal contours are within normal limits.  Both lungs are clear.  The visualized skeletal structures are unremarkable.

[view not recorded (1 of 2)]
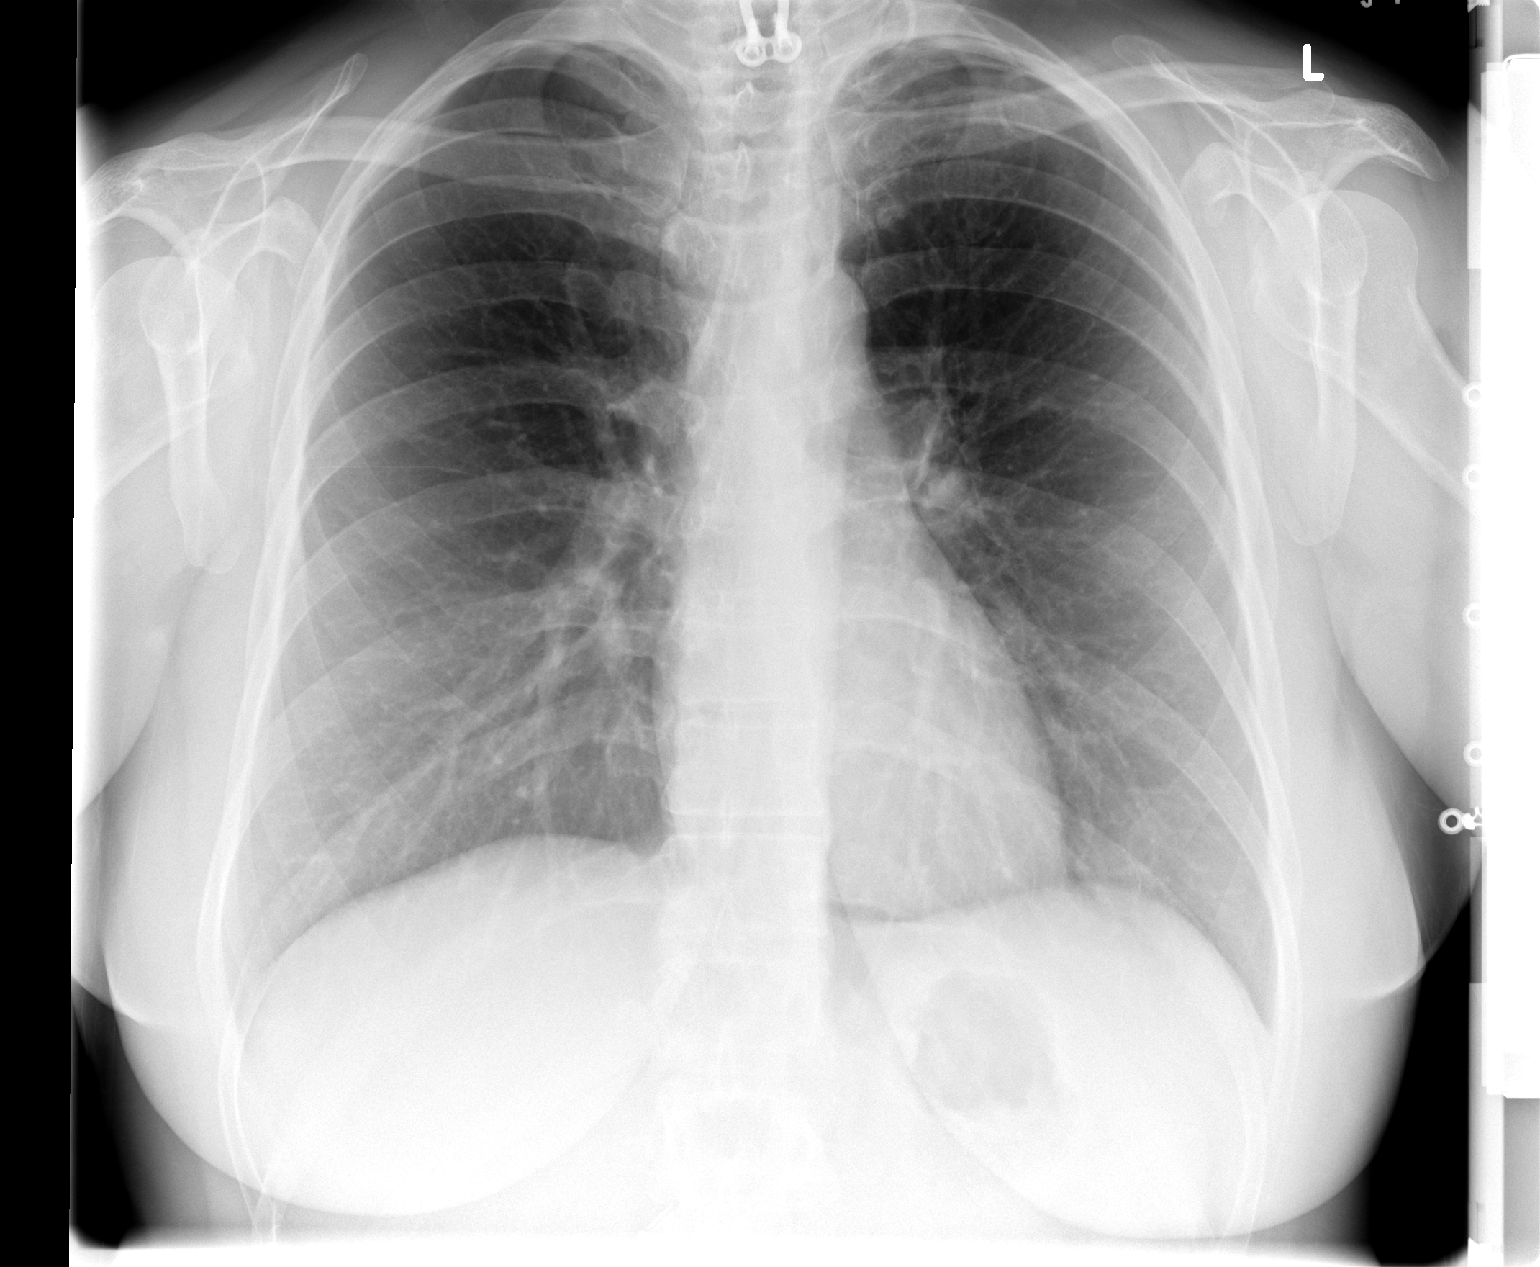

[view not recorded (2 of 2)]
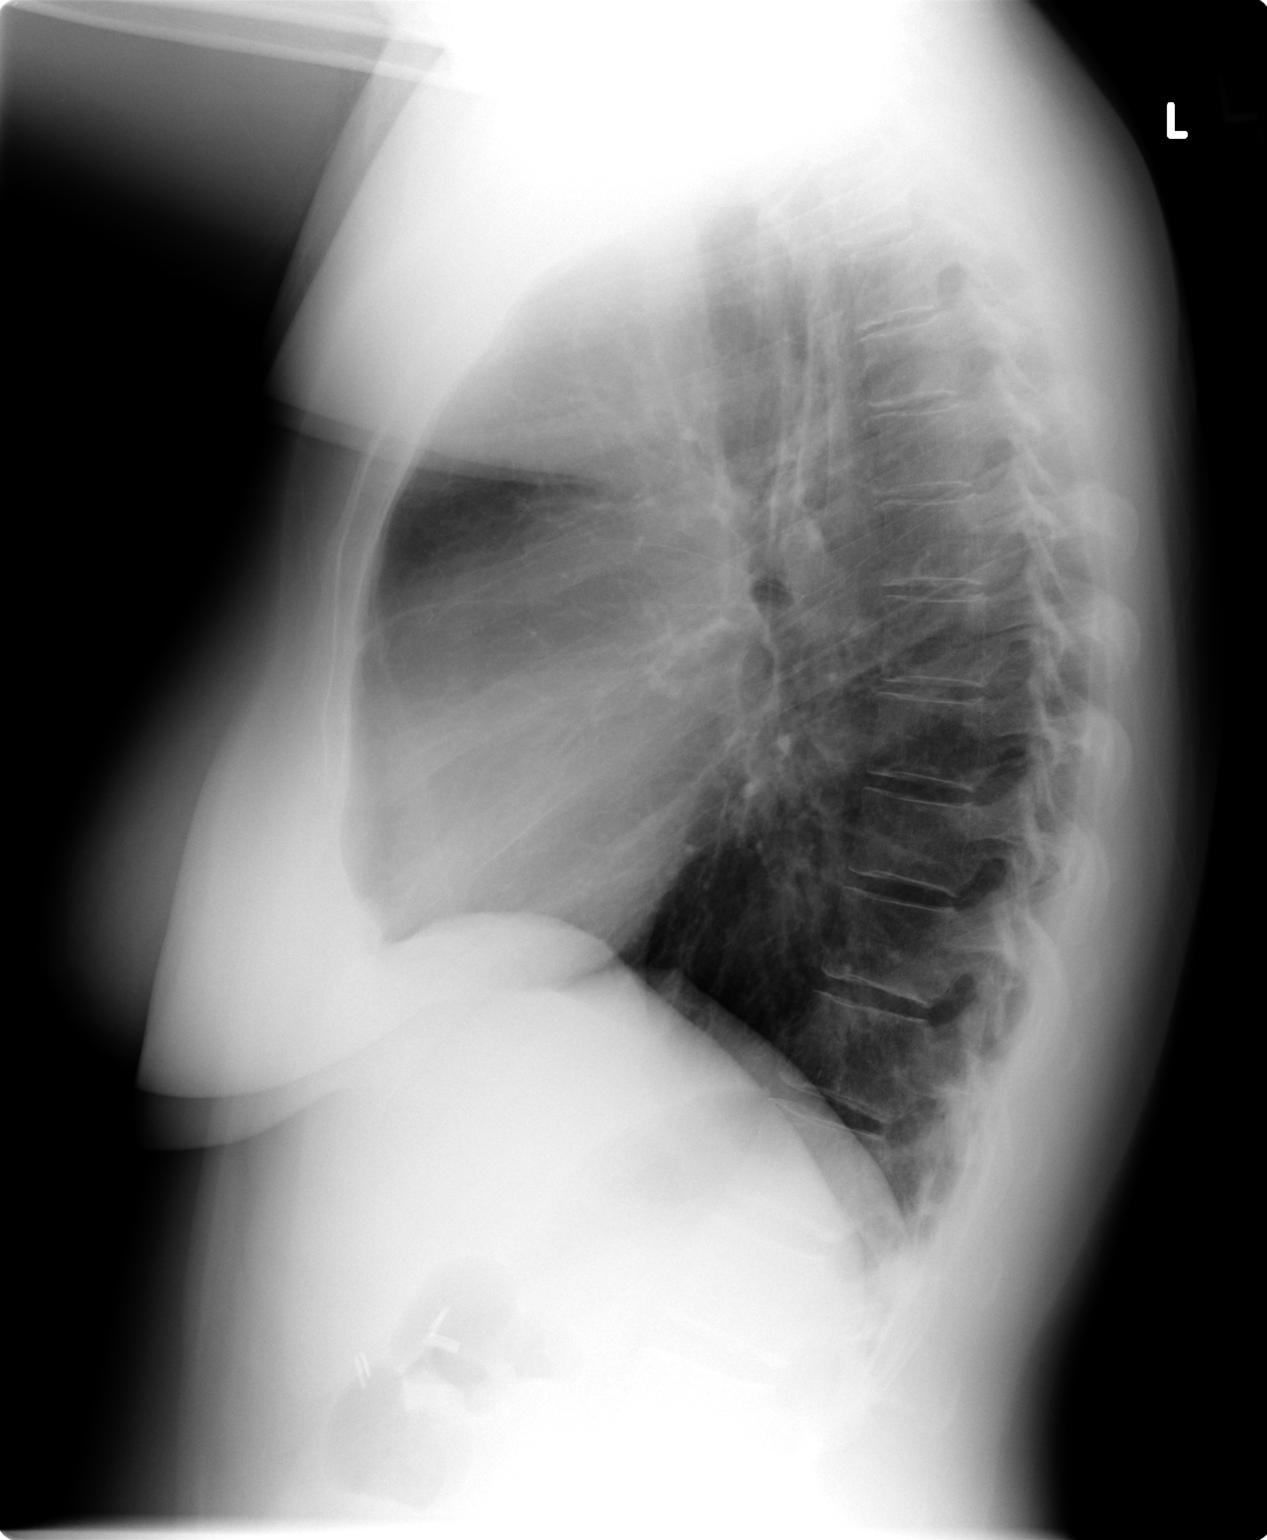

[2 of 2 positions shown; findings below may reference images not displayed]

IMPRESSION: No active cardiopulmonary disease.

## 2006-02-14 ENCOUNTER — Other Ambulatory Visit: Admission: RE | Admit: 2006-02-14 | Discharge: 2006-02-14 | Payer: Self-pay | Admitting: *Deleted

## 2006-02-22 ENCOUNTER — Ambulatory Visit: Payer: Self-pay

## 2006-02-22 ENCOUNTER — Encounter: Payer: Self-pay | Admitting: Internal Medicine

## 2006-02-27 ENCOUNTER — Encounter: Admission: RE | Admit: 2006-02-27 | Discharge: 2006-02-27 | Payer: Self-pay | Admitting: Emergency Medicine

## 2006-03-21 ENCOUNTER — Ambulatory Visit: Payer: Self-pay | Admitting: Internal Medicine

## 2006-04-06 ENCOUNTER — Emergency Department (HOSPITAL_COMMUNITY): Admission: EM | Admit: 2006-04-06 | Discharge: 2006-04-07 | Payer: Self-pay | Admitting: Emergency Medicine

## 2006-04-22 ENCOUNTER — Emergency Department (HOSPITAL_COMMUNITY): Admission: EM | Admit: 2006-04-22 | Discharge: 2006-04-22 | Payer: Self-pay | Admitting: Emergency Medicine

## 2006-05-06 ENCOUNTER — Other Ambulatory Visit: Admission: RE | Admit: 2006-05-06 | Discharge: 2006-05-06 | Payer: Self-pay | Admitting: Internal Medicine
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
## 2006-06-07 ENCOUNTER — Encounter: Admission: RE | Admit: 2006-06-07 | Discharge: 2006-06-07 | Payer: Self-pay | Admitting: Internal Medicine

## 2006-07-18 ENCOUNTER — Emergency Department (HOSPITAL_COMMUNITY): Admission: EM | Admit: 2006-07-18 | Discharge: 2006-07-18 | Payer: Self-pay | Admitting: Emergency Medicine

## 2006-08-01 ENCOUNTER — Emergency Department (HOSPITAL_COMMUNITY): Admission: EM | Admit: 2006-08-01 | Discharge: 2006-08-01 | Payer: Self-pay | Admitting: Family Medicine

## 2006-10-28 ENCOUNTER — Encounter: Admission: RE | Admit: 2006-10-28 | Discharge: 2006-10-28 | Payer: Self-pay | Admitting: Internal Medicine

## 2006-11-08 IMAGING — CT CT ABDOMEN W/O CM
2 of 3 series · 17 of 46 positions shown, 19 images · IV contrast (READICAT/WATER)
Comparison: Enhanced abdominal CT 01/18/04.

CLINICAL DATA: Right flank pain and right lower quadrant abdominal pain for several days.  Microscopic hematuria. 
ABDOMEN CT WITHOUT CONTRAST:
TECHNIQUE: Multidetector CT imaging of the abdomen was performed following the standard protocol without IV contrast.
TECHNIQUE: Multidetector CT imaging of the pelvis was performed following the standard protocol without IV contrast.

[Series 2: routine abdomen · axial · 0.80mm/px · z∈[-402,-12]mm · 14 of 90 slices shown, 16 images]
[im 6/90  soft-tissue]
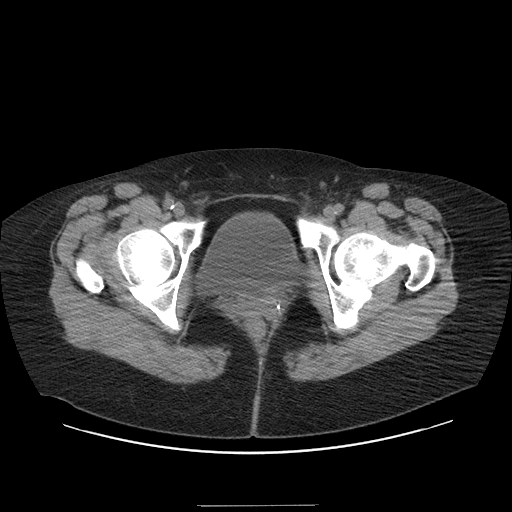
[im 6/90  bone]
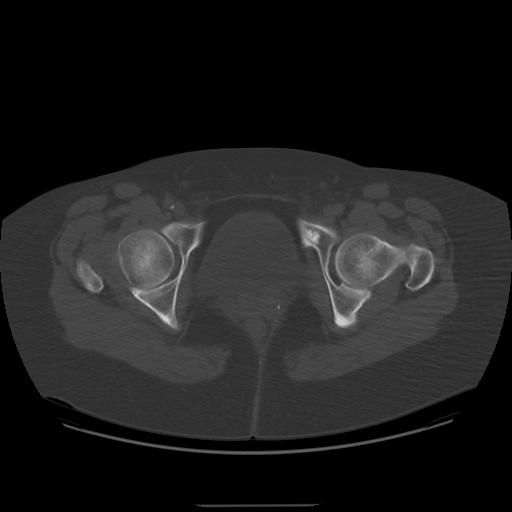
[im 12/90  soft-tissue]
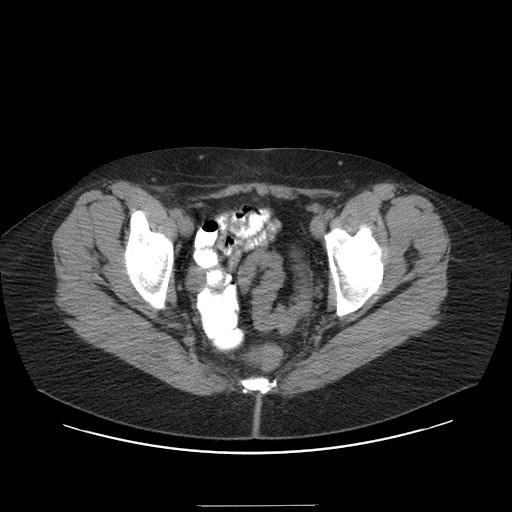
[im 18/90  soft-tissue]
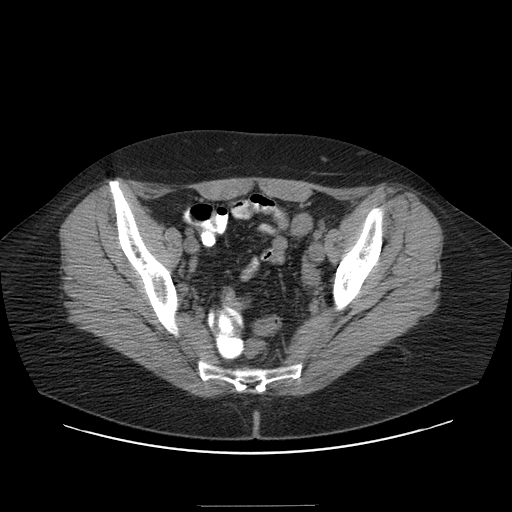
[im 23/90  soft-tissue]
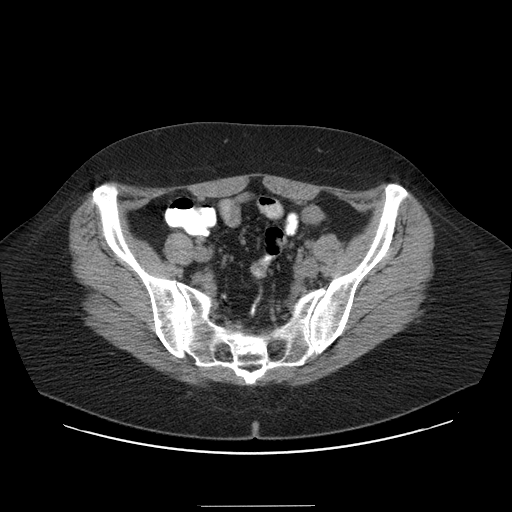
[im 29/90  soft-tissue]
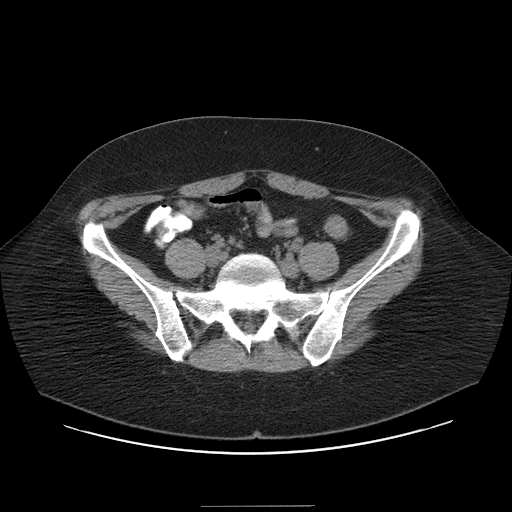
[im 35/90  soft-tissue]
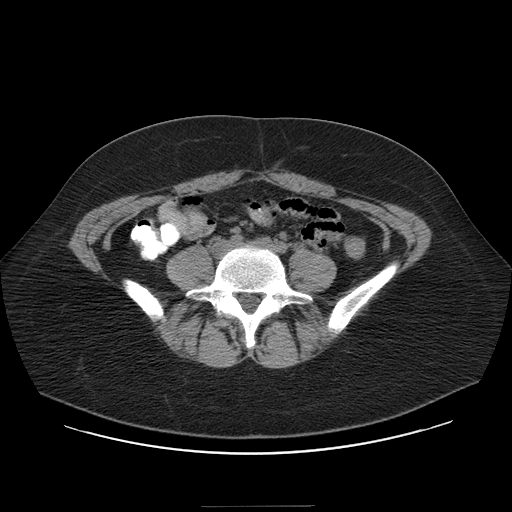
[im 41/90  soft-tissue]
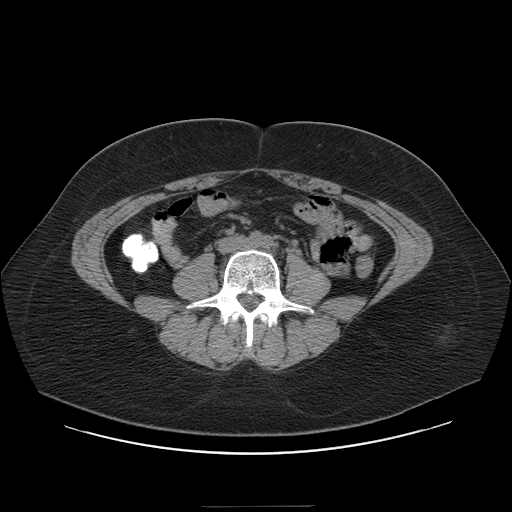
[im 49/90  soft-tissue]
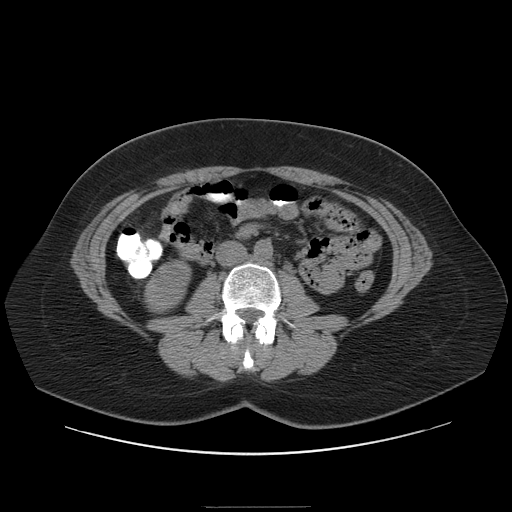
[im 55/90  soft-tissue]
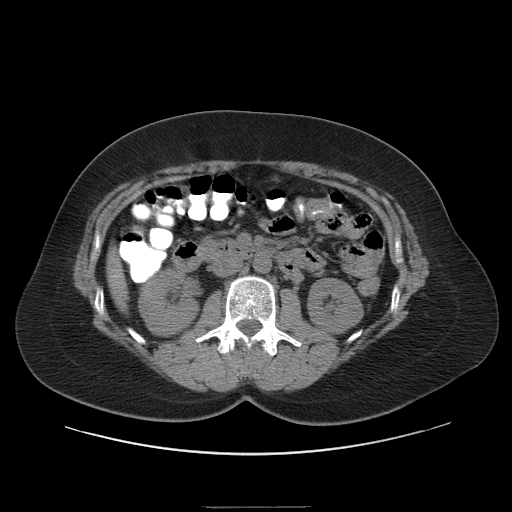
[im 55/90  bone]
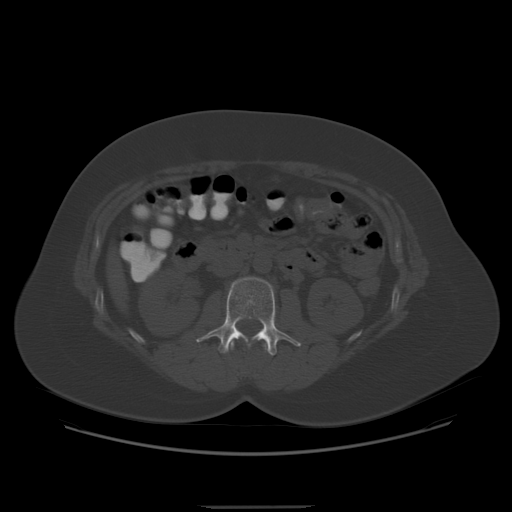
[im 61/90  soft-tissue]
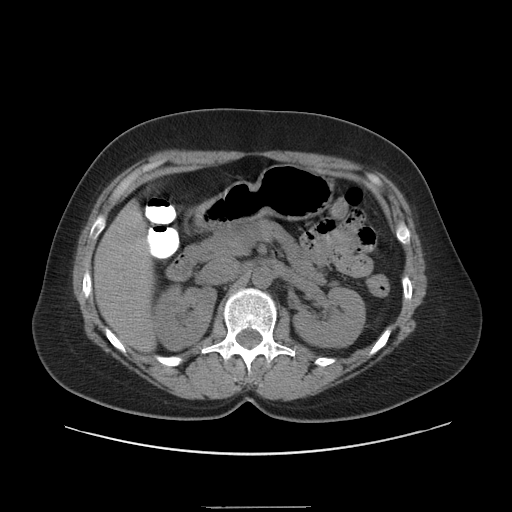
[im 67/90  soft-tissue]
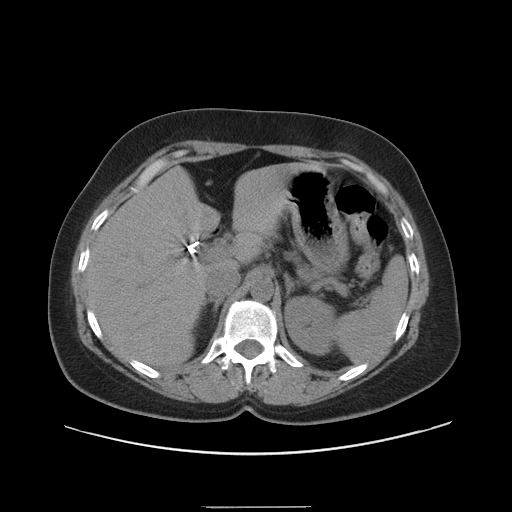
[im 72/90  soft-tissue]
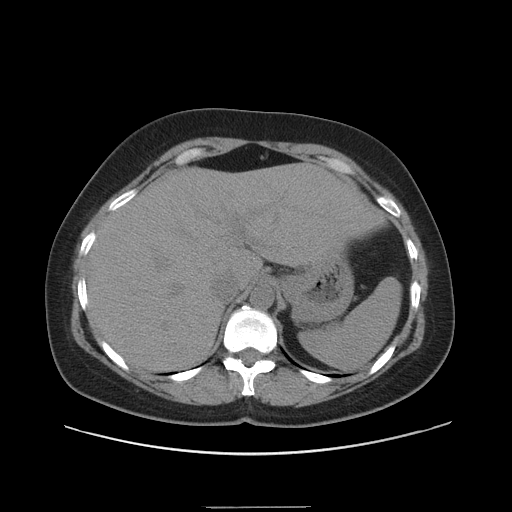
[im 78/90  soft-tissue]
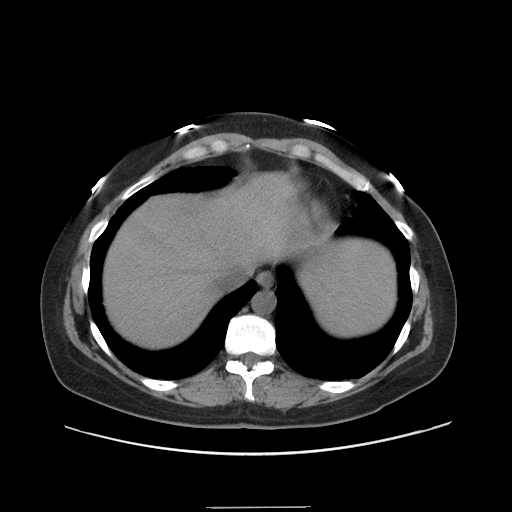
[im 84/90  soft-tissue]
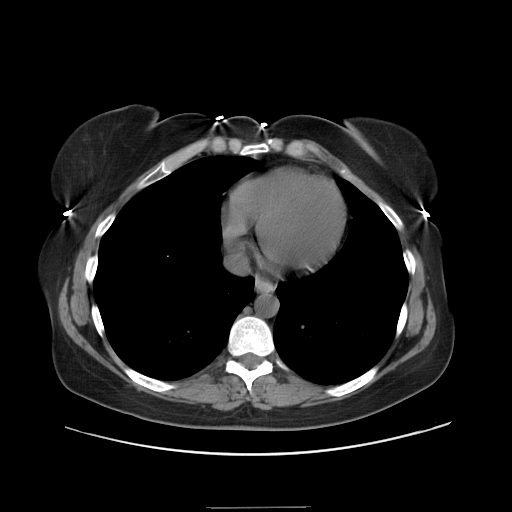

[Series 103: reformatted · sagittal · 0.97mm/px · 3 of 134 slices shown]
[im 45/134  soft-tissue]
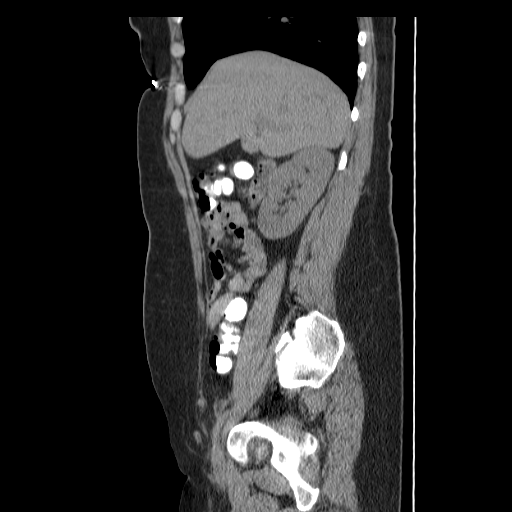
[im 60/134  soft-tissue]
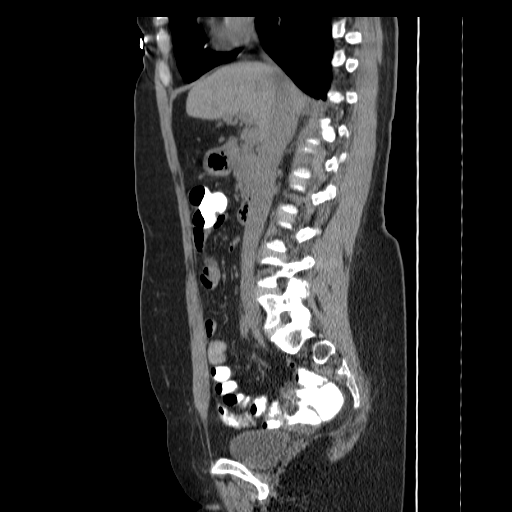
[im 74/134  soft-tissue]
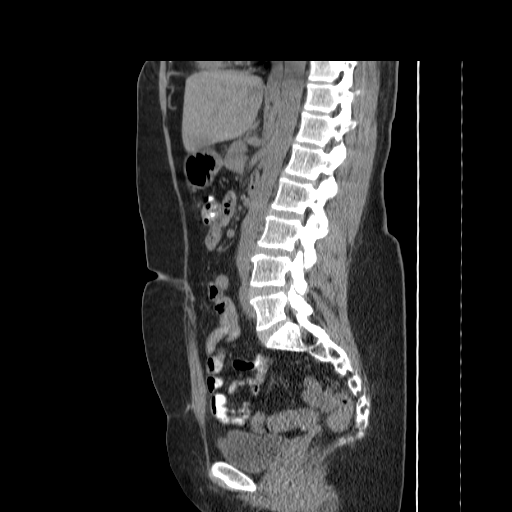

[17 of 46 positions shown; findings below may reference images not displayed]

FINDINGS: The lung bases are clear.  There is no pleural effusion.  Both kidneys appear normal without renal calculi, hydronephrosis, or perinephric soft tissue stranding.  The gallbladder is surgically absent.  There is no significant biliary dilatation.  The liver, spleen, adrenal glands, and pancreas appear normal.  No inflammatory changes are identified.
IMPRESSION: No evidence of urinary tract calculi or hydronephrosis.  No intraabdominal inflammatory changes are demonstrated. 
PELVIS CT WITHOUT CONTRAST:
FINDINGS: The patient has undergone interval hysterectomy.  There is no adnexal mass.  A normal caliber appendix is filled with contrast and shows no surrounding inflammatory change (images 67 through 70).  There is no evidence of ureteral calculus, hydronephrosis, or inflammatory process.  A small bone island in the left superior pubic ramus appears stable.
IMPRESSION: 1.  No evidence of ureteral calculus or hydronephrosis. 
2.  No evidence of appendicitis or other pelvic inflammatory process.

## 2006-12-23 ENCOUNTER — Ambulatory Visit: Payer: Self-pay | Admitting: Internal Medicine

## 2007-01-01 IMAGING — CR DG CHEST 2V
2 series · 2 of 2 positions shown · non-contrast
Comparison: 07/15/05.

CLINICAL DATA: Chest pain, cough and fever. 
 CHEST - 2 VIEW:

[view not recorded (1 of 2)]
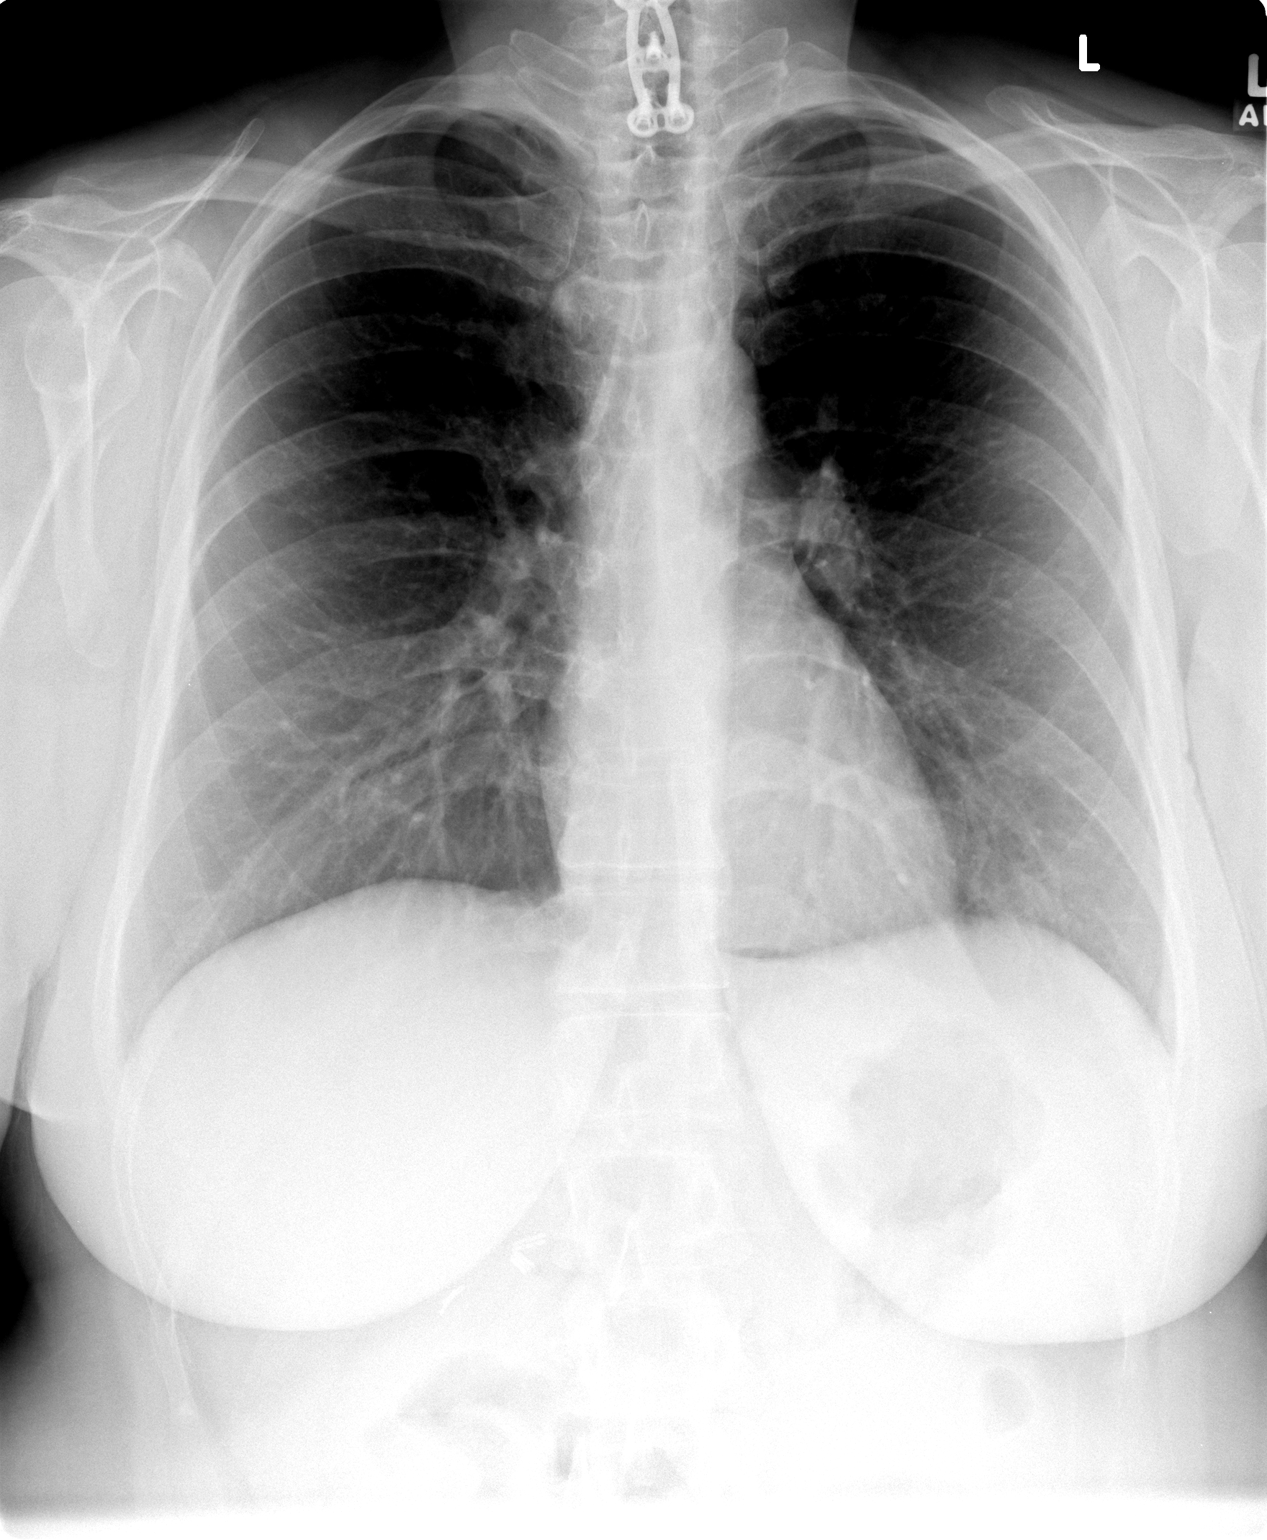

[view not recorded (2 of 2)]
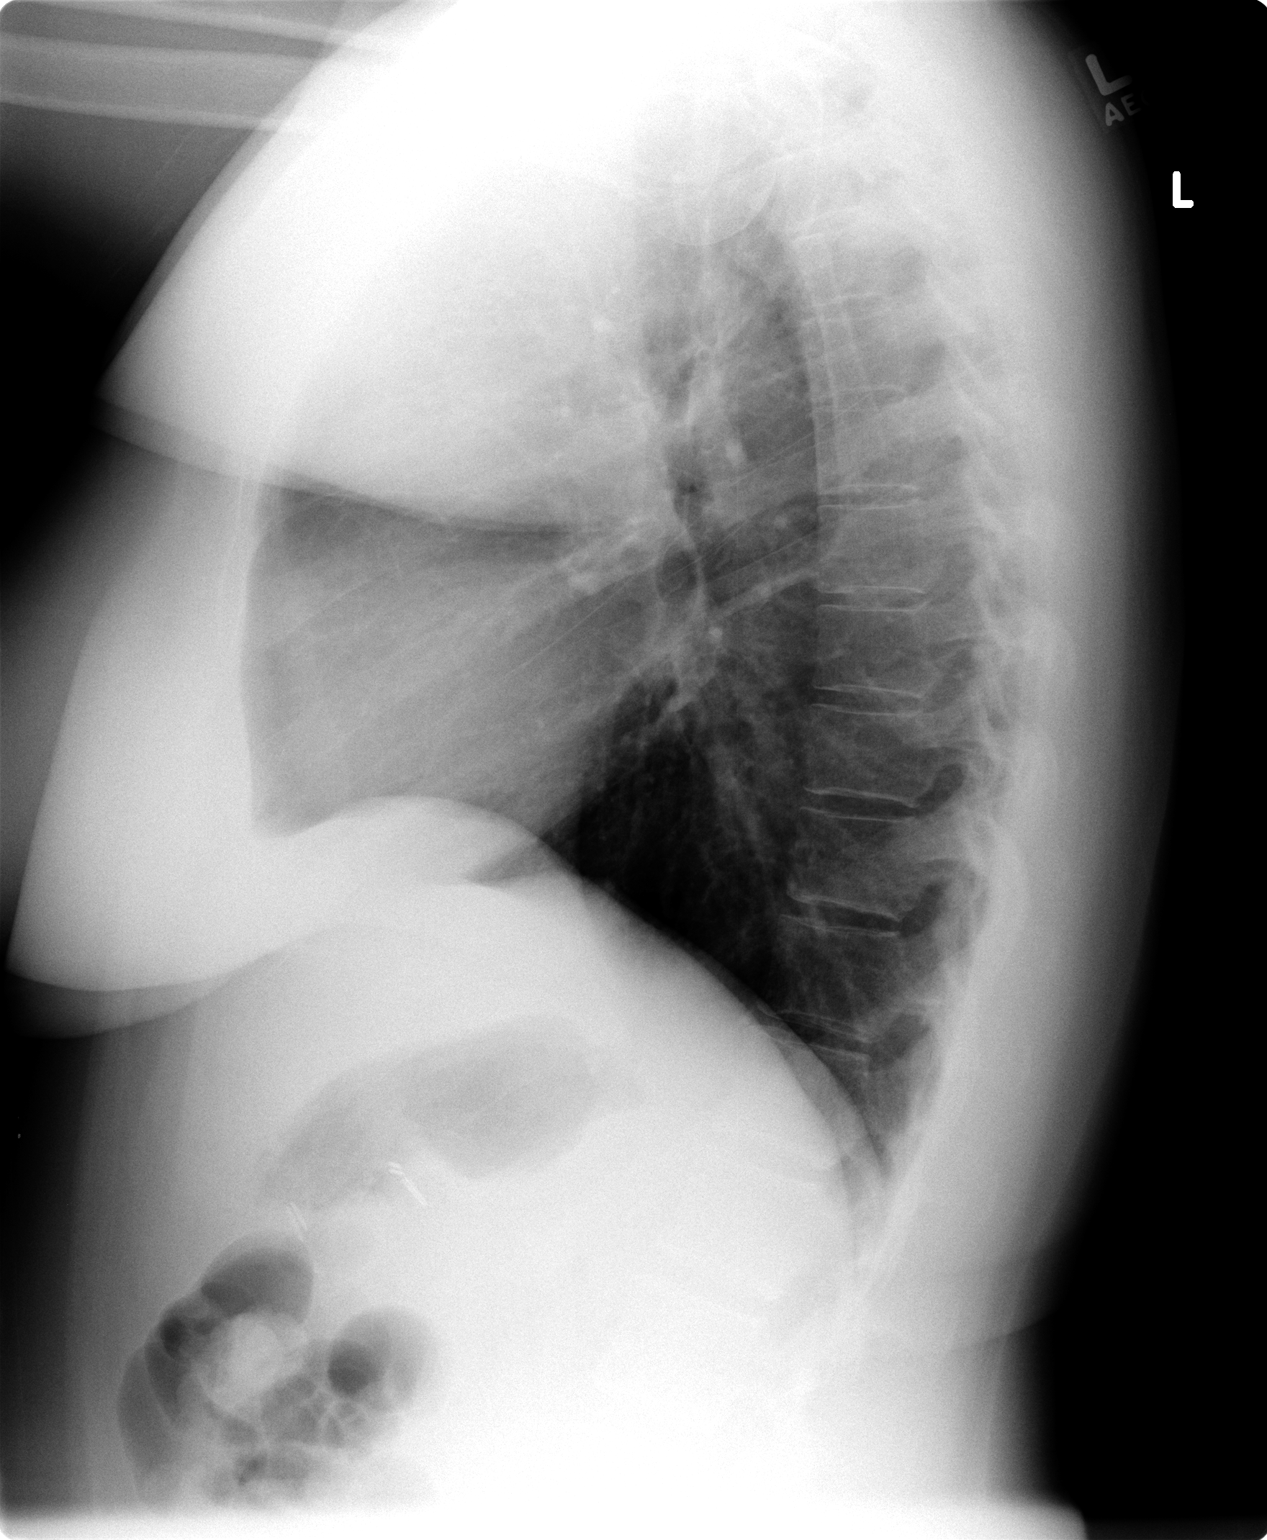

[2 of 2 positions shown; findings below may reference images not displayed]

FINDINGS: The heart size is enlarged.  There are no effusions or edema.   I see no airspace opacities.  
 There is anterior sideplate and screw device within the lower cervical upper thoracic spine.
IMPRESSION: No active cardiopulmonary disease.

## 2007-03-07 HISTORY — PX: CARDIAC CATHETERIZATION: SHX172

## 2007-03-09 ENCOUNTER — Emergency Department (HOSPITAL_COMMUNITY): Admission: EM | Admit: 2007-03-09 | Discharge: 2007-03-09 | Payer: Self-pay | Admitting: Family Medicine

## 2007-03-14 ENCOUNTER — Inpatient Hospital Stay (HOSPITAL_COMMUNITY): Admission: EM | Admit: 2007-03-14 | Discharge: 2007-03-16 | Payer: Self-pay | Admitting: Emergency Medicine

## 2007-03-14 ENCOUNTER — Ambulatory Visit: Payer: Self-pay | Admitting: Cardiology

## 2007-03-22 ENCOUNTER — Other Ambulatory Visit: Admission: RE | Admit: 2007-03-22 | Discharge: 2007-03-22 | Payer: Self-pay | Admitting: *Deleted
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
## 2007-08-20 ENCOUNTER — Emergency Department (HOSPITAL_COMMUNITY): Admission: EM | Admit: 2007-08-20 | Discharge: 2007-08-20 | Payer: Self-pay | Admitting: Emergency Medicine

## 2007-09-04 ENCOUNTER — Ambulatory Visit: Payer: Self-pay | Admitting: Cardiology

## 2007-10-08 ENCOUNTER — Emergency Department (HOSPITAL_COMMUNITY): Admission: EM | Admit: 2007-10-08 | Discharge: 2007-10-09 | Payer: Self-pay | Admitting: Emergency Medicine

## 2007-11-23 IMAGING — CR DG CHEST 2V
2 series · 2 of 2 positions shown · non-contrast
Comparison: 04/22/06.

CLINICAL DATA: Chest pain. Cough.
 CHEST - 2 VIEW:

[w chest pa]
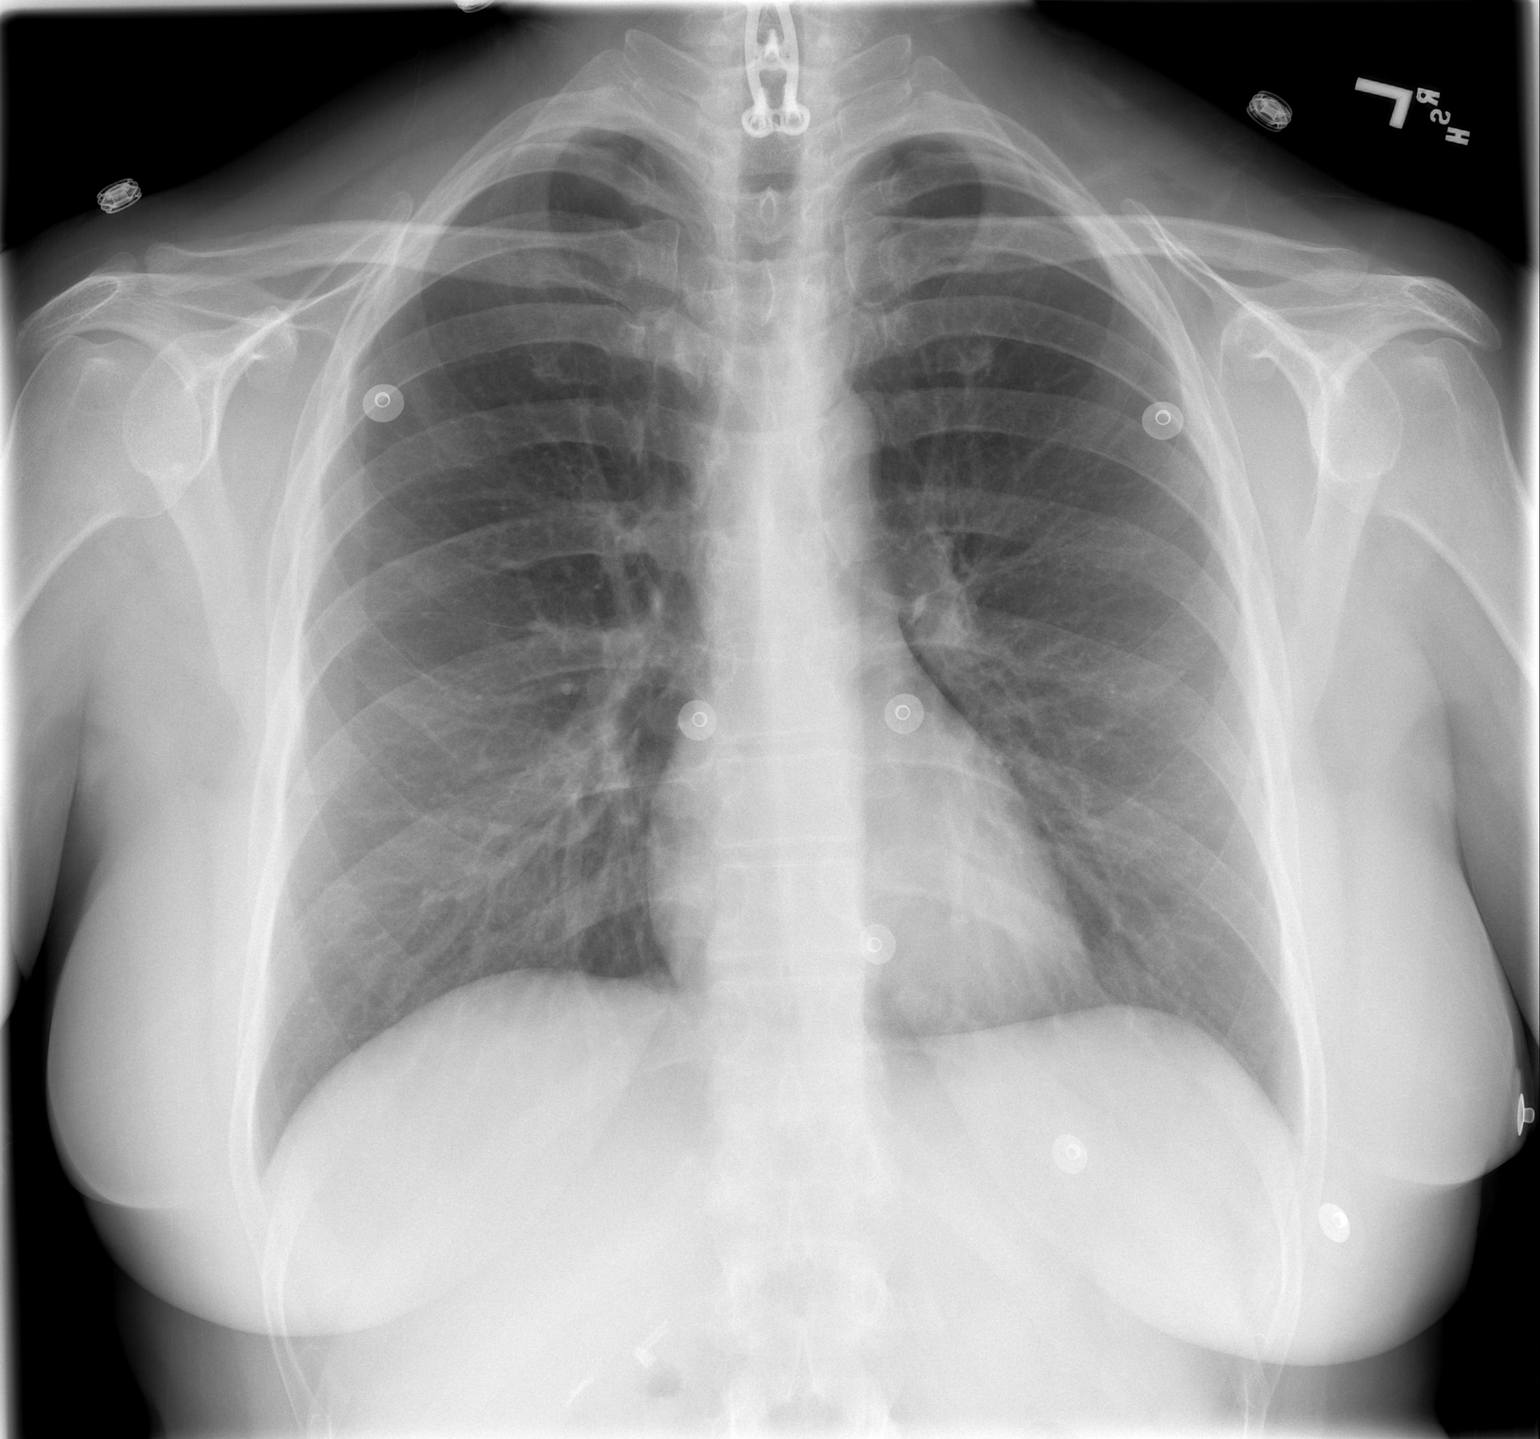

[w chest lat]
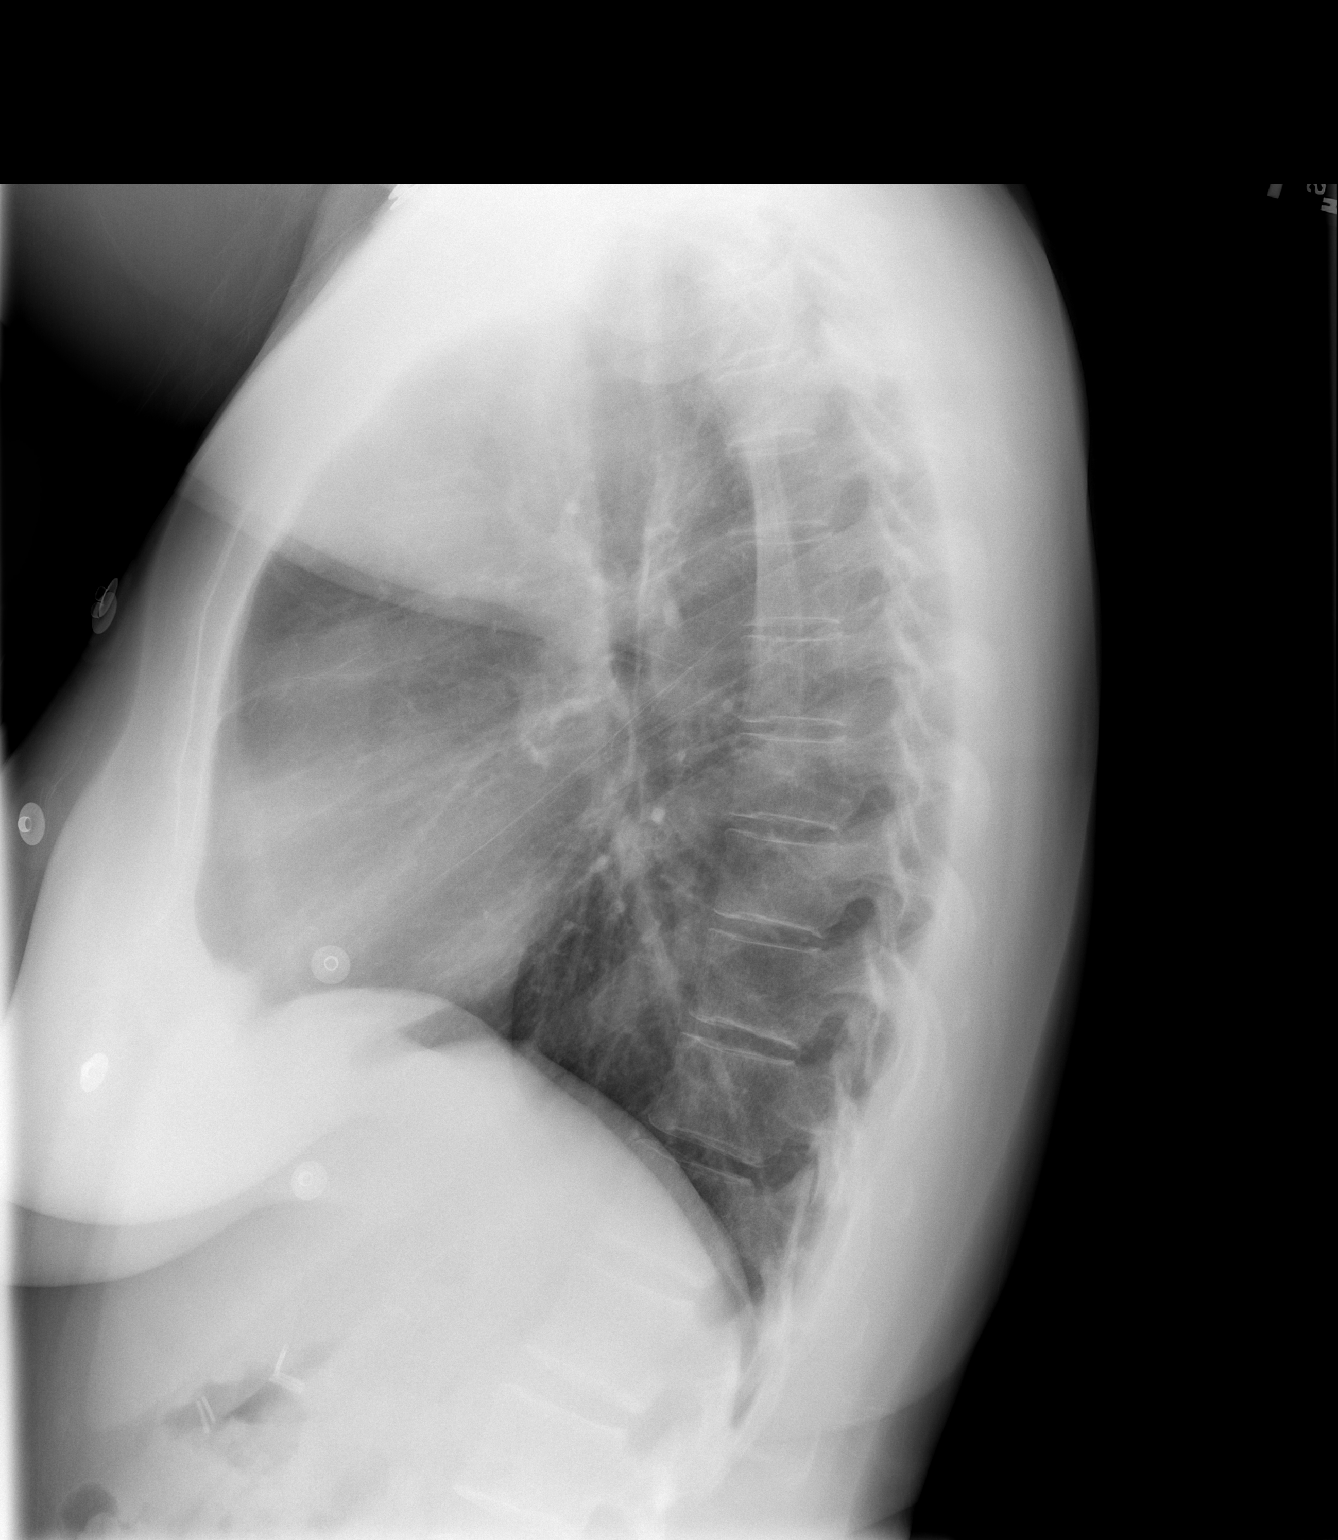

[2 of 2 positions shown; findings below may reference images not displayed]

FINDINGS: The heart size and mediastinal contours are within normal limits.  Both lungs are clear.  The visualized skeletal structures are unremarkable.
IMPRESSION: No active cardiopulmonary disease.

## 2007-11-25 IMAGING — CR DG CHEST 2V
2 series · 2 of 2 positions shown · non-contrast
Comparison: 03/14/2007.

CLINICAL DATA: Shortness of breath and weakness.

CHEST - 2 VIEW

[w chest pa]
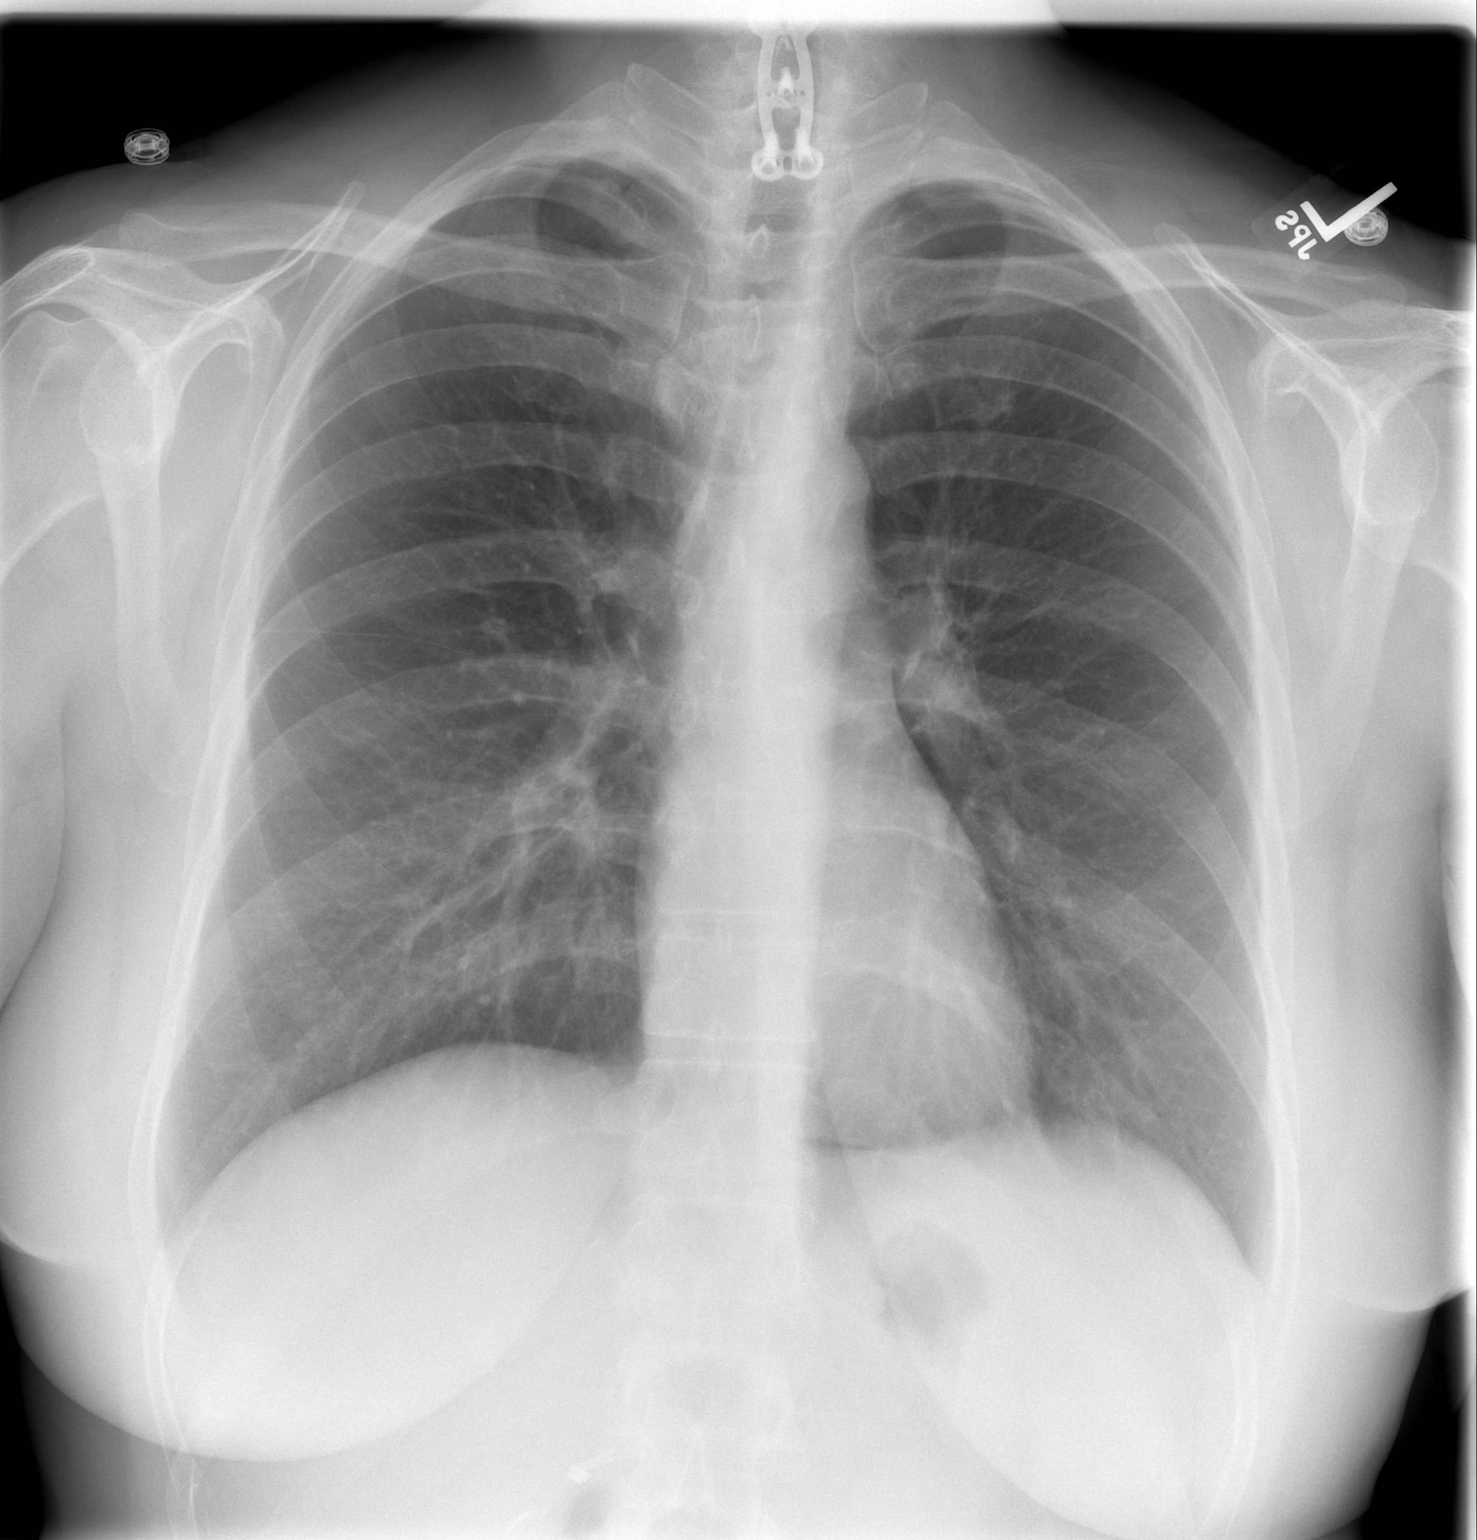

[w chest lat]
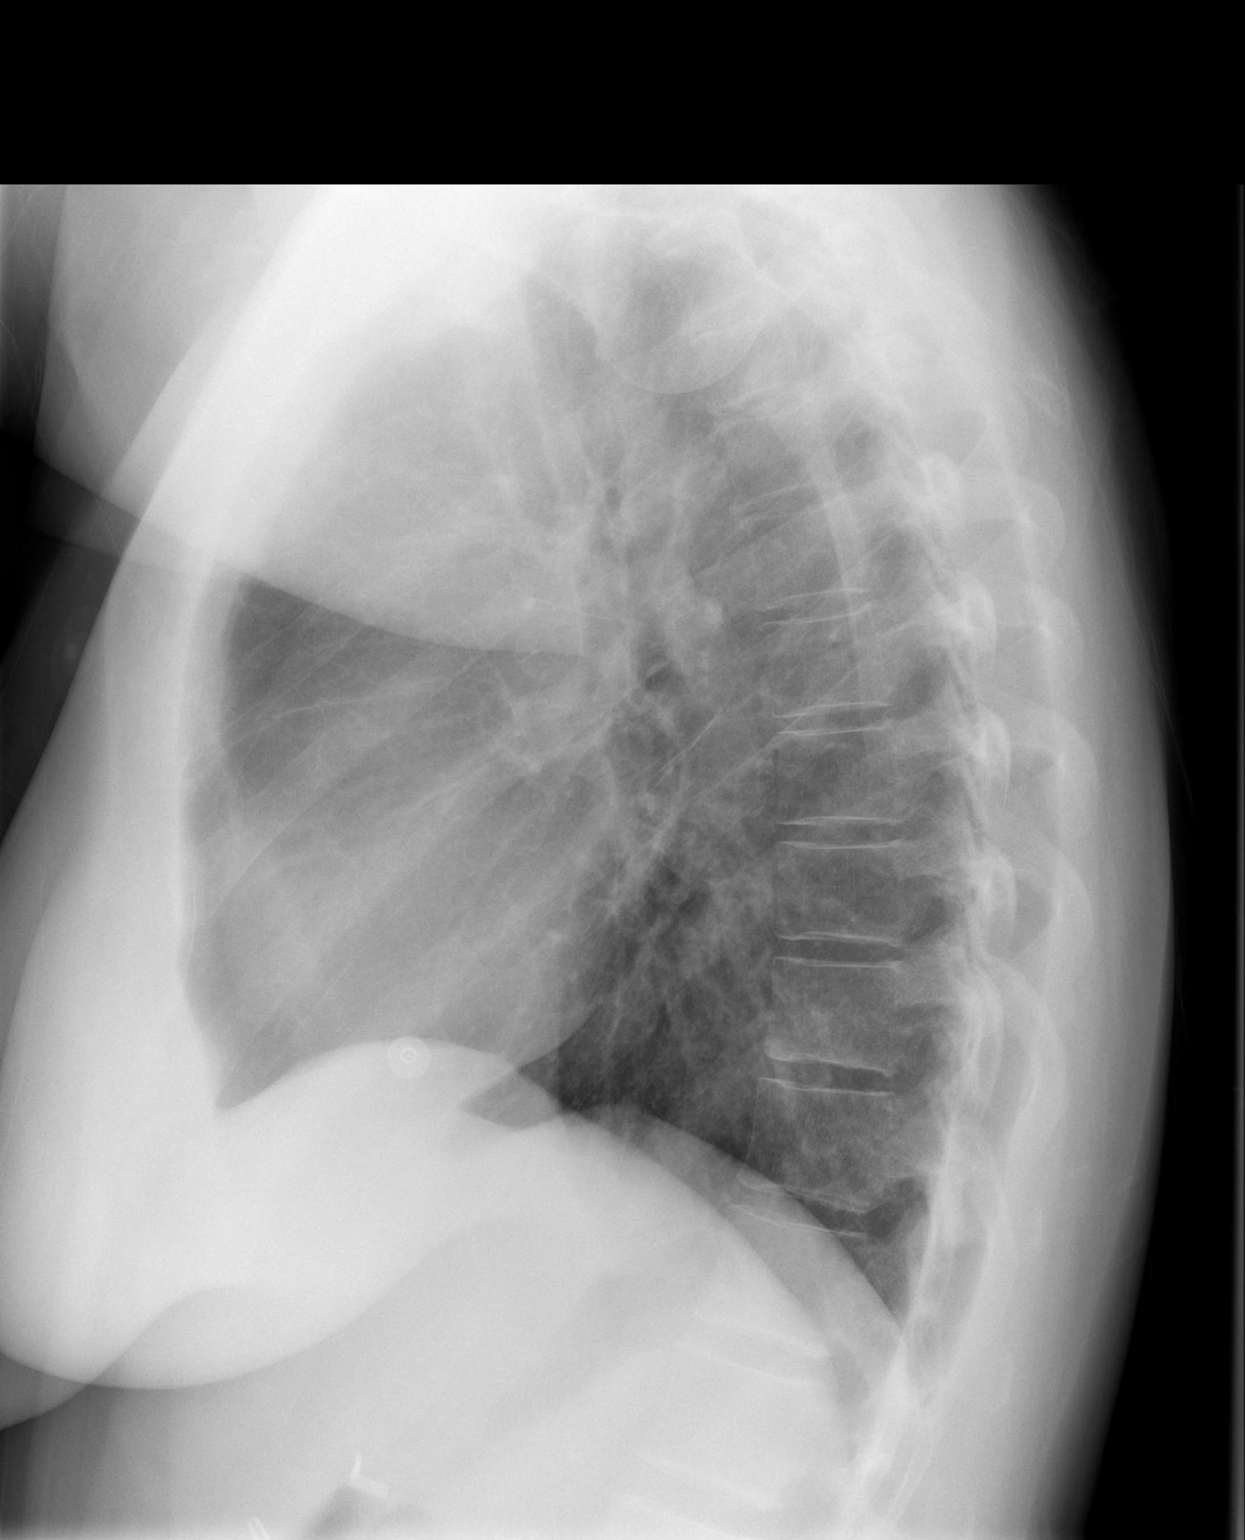

[2 of 2 positions shown; findings below may reference images not displayed]

FINDINGS: Normal sized heart. Clear lungs. Screw and plate fixation of the
lower cervical spine. Minimal thoracic spine demonstrate changes.
Cholecystectomy clips.

IMPRESSION

No acute abnormality.

## 2008-04-02 ENCOUNTER — Encounter: Admission: RE | Admit: 2008-04-02 | Discharge: 2008-04-02 | Payer: Self-pay | Admitting: Internal Medicine

## 2008-04-30 IMAGING — CR DG LUMBAR SPINE COMPLETE 4+V
6 series · 6 of 6 positions shown · non-contrast
Comparison: 07/18/06.
COMPARISON: Right hip MRI of 09/05/04.

CLINICAL DATA: Low back and right hip pain for 3 months.  Worsening pain over the last 3 days.  No known injury. 
 DIAGNOSTIC LUMBAR SPINE ? 4 VIEW:

[view not recorded (1 of 6)]
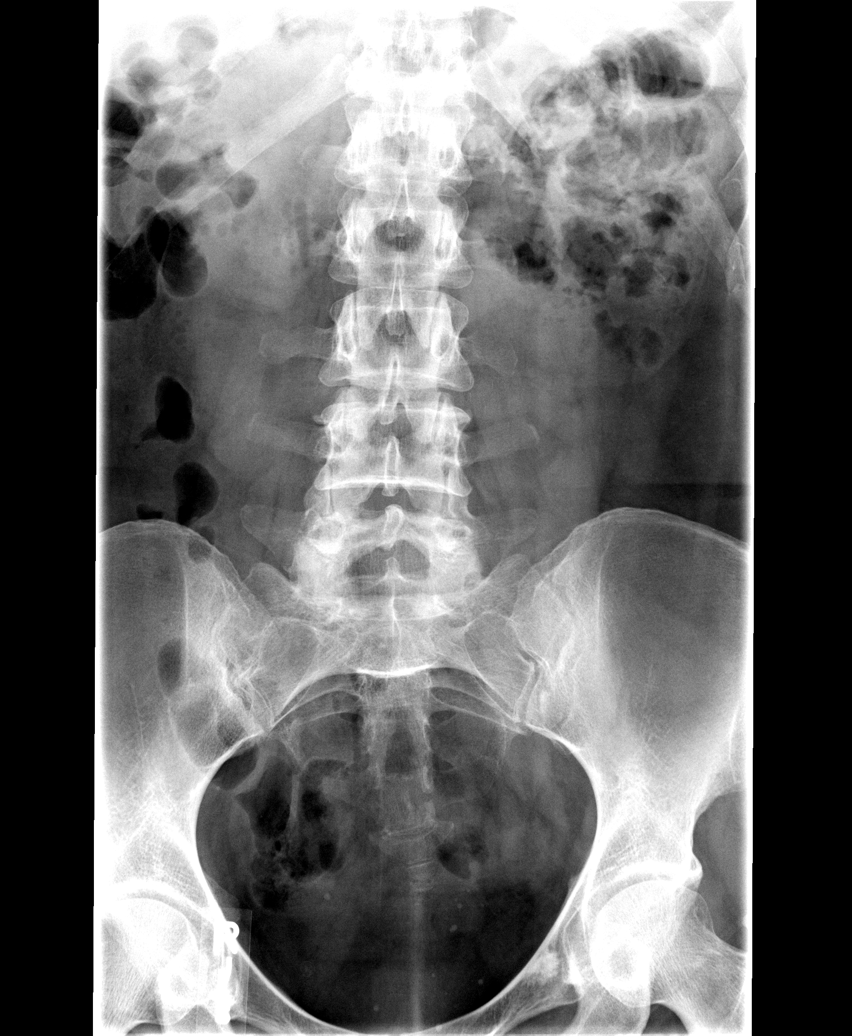

[view not recorded (2 of 6)]
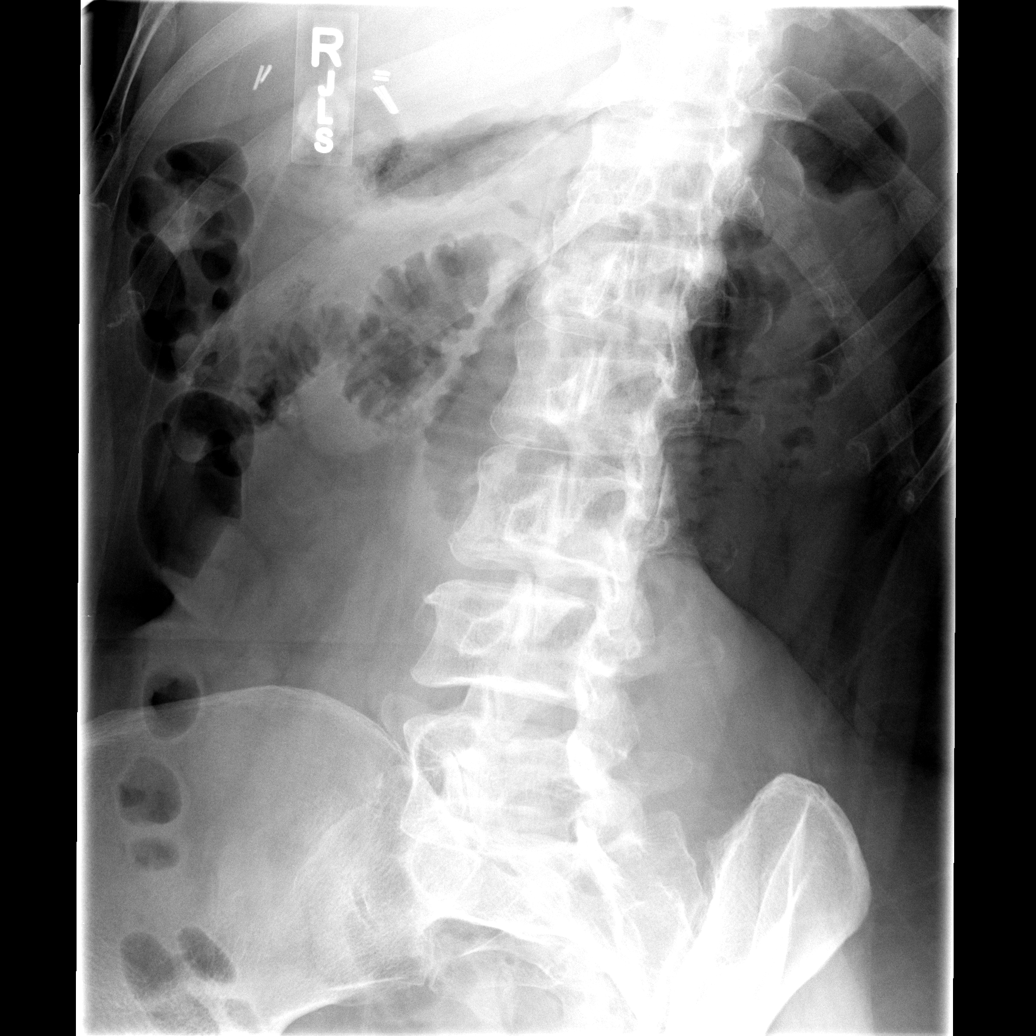

[view not recorded (3 of 6)]
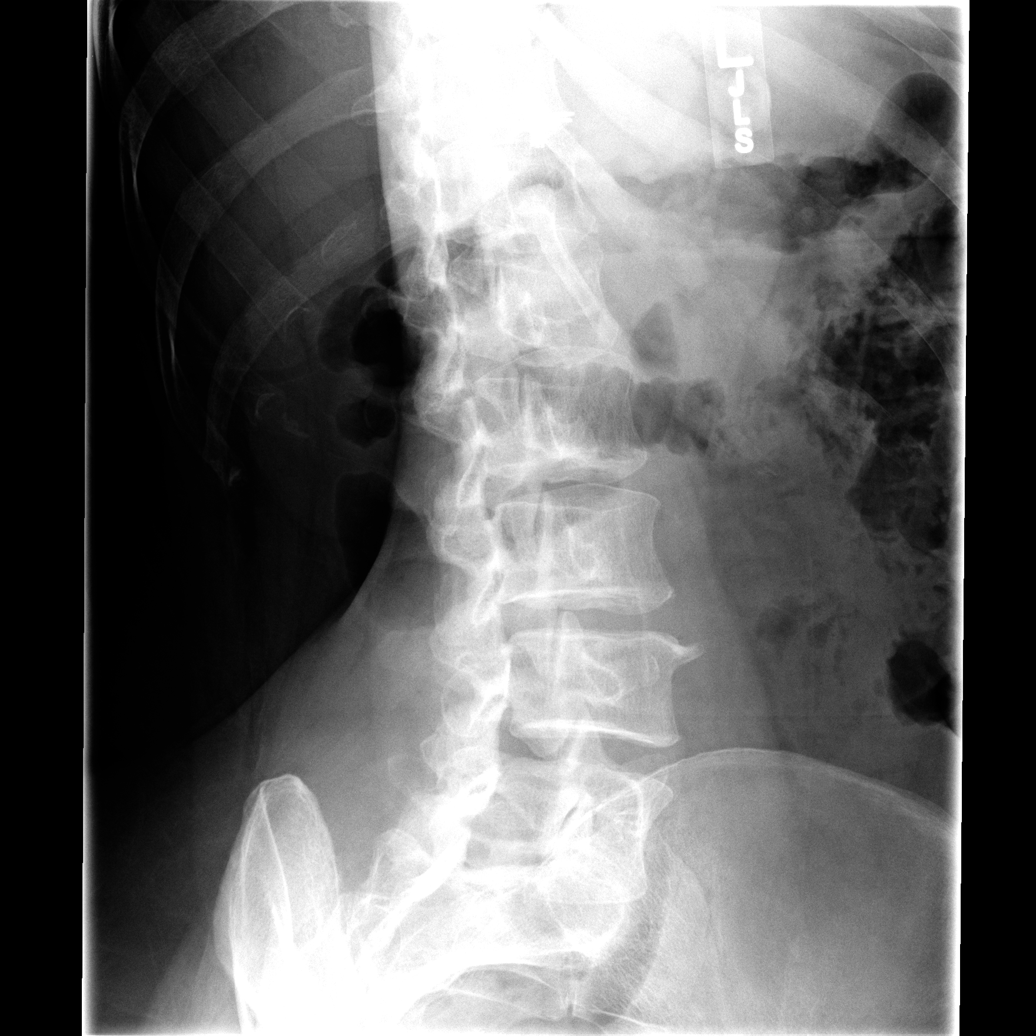

[view not recorded (4 of 6)]
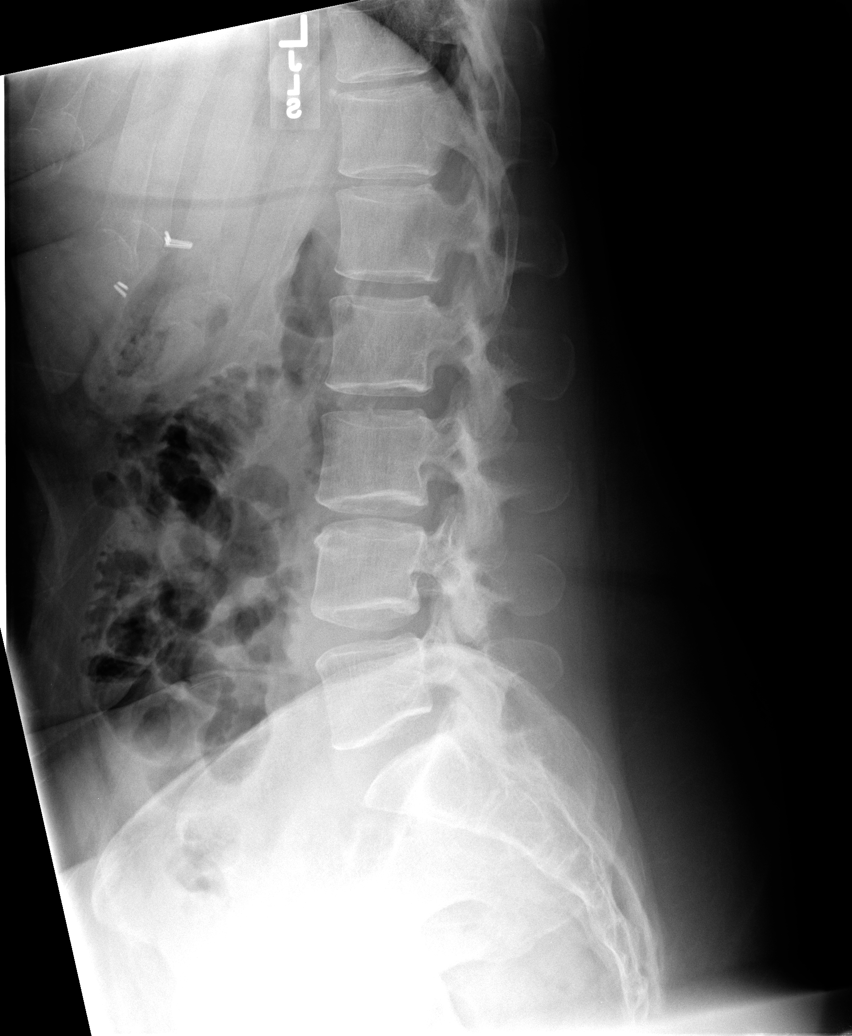

[view not recorded (5 of 6)]
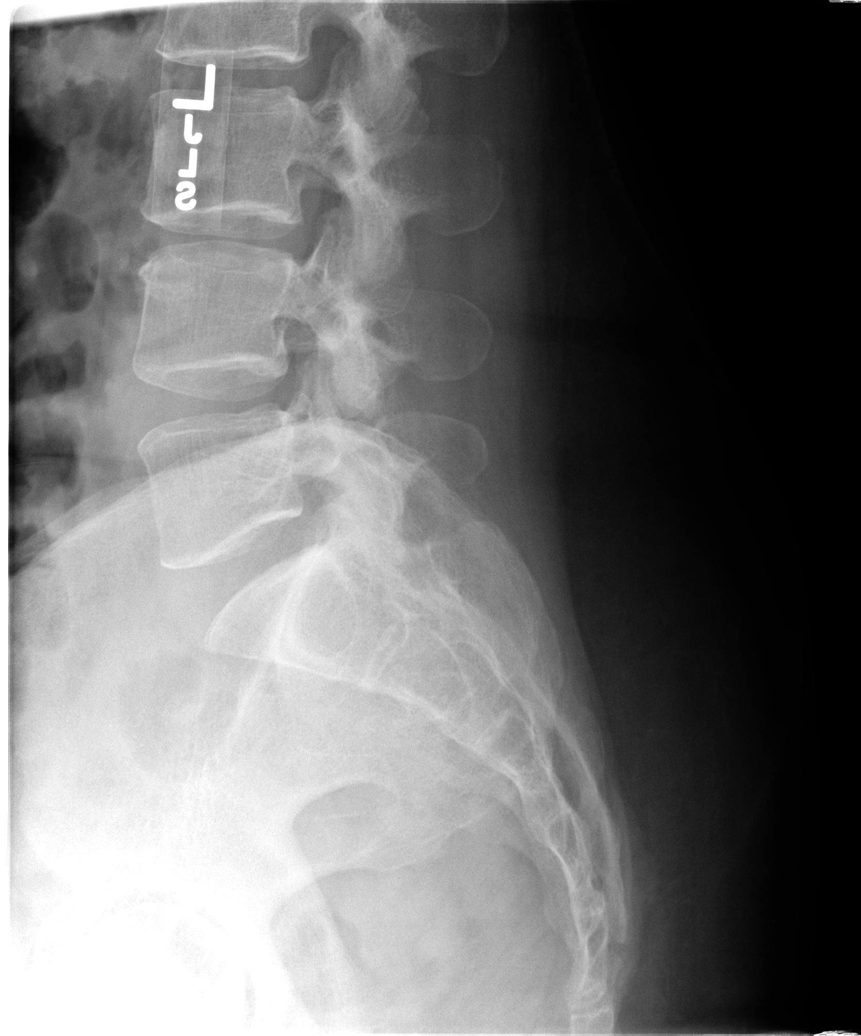

[view not recorded (6 of 6)]
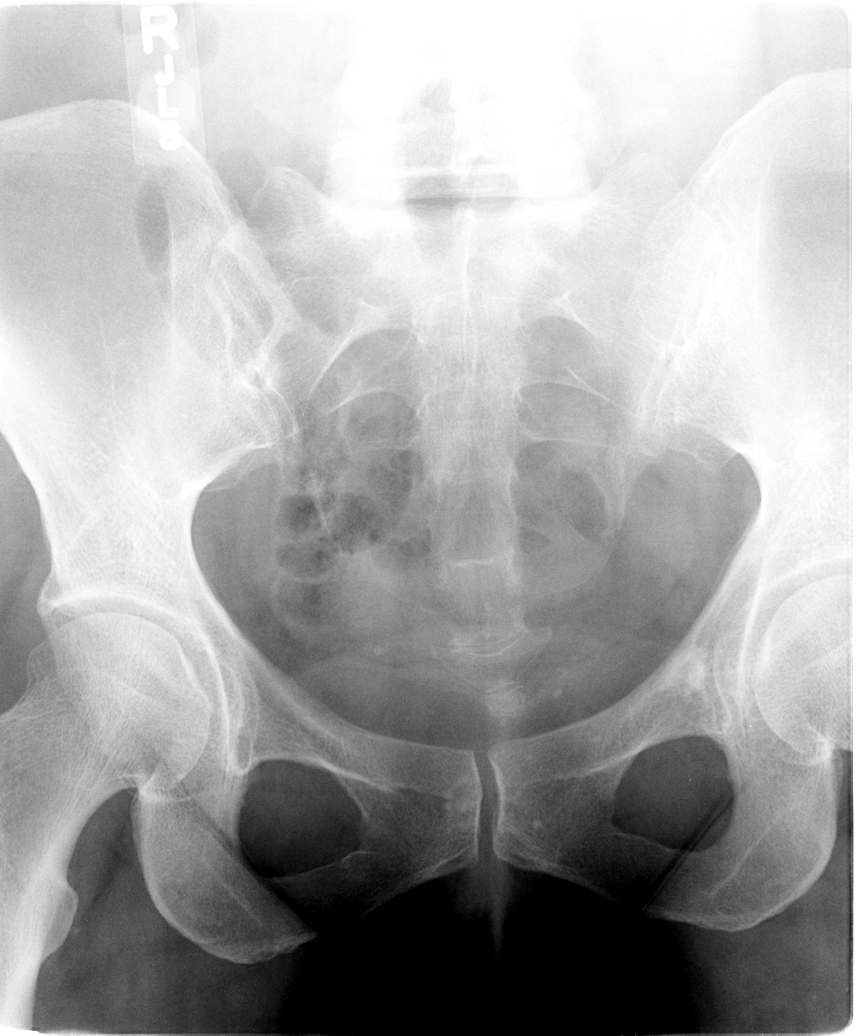

[6 of 6 positions shown; findings below may reference images not displayed]

FINDINGS: Five lumbar-type vertebral bodies are in stable alignment.  There is stable mild disk space loss of L3-4.  No acute fracture, pars defect, or significant facet disease is demonstrated.
IMPRESSION: Stable examination.  No acute findings. 
 RIGHT HIP ? 2 VIEW:
FINDINGS: There is no evidence of acute fracture or dislocation.  The right femoral head appears normal.  The hip joint spaces are preserved.  Mineralization is normal.  Well-corticated ossific densities adjacent to the right iliac crest and the left greater trochanter are unchanged from the abdominal radiographs on 10/28/06.  There is a bone island in the left superior pubic ramus.
IMPRESSION: Stable examination.  No acute osseous findings.

## 2008-04-30 IMAGING — CR DG HIP COMPLETE 2+V*R*
3 series · 3 of 3 positions shown · non-contrast
Comparison: 07/18/06.
COMPARISON: Right hip MRI of 09/05/04.

CLINICAL DATA: Low back and right hip pain for 3 months.  Worsening pain over the last 3 days.  No known injury. 
 DIAGNOSTIC LUMBAR SPINE ? 4 VIEW:

[view not recorded (1 of 3)]
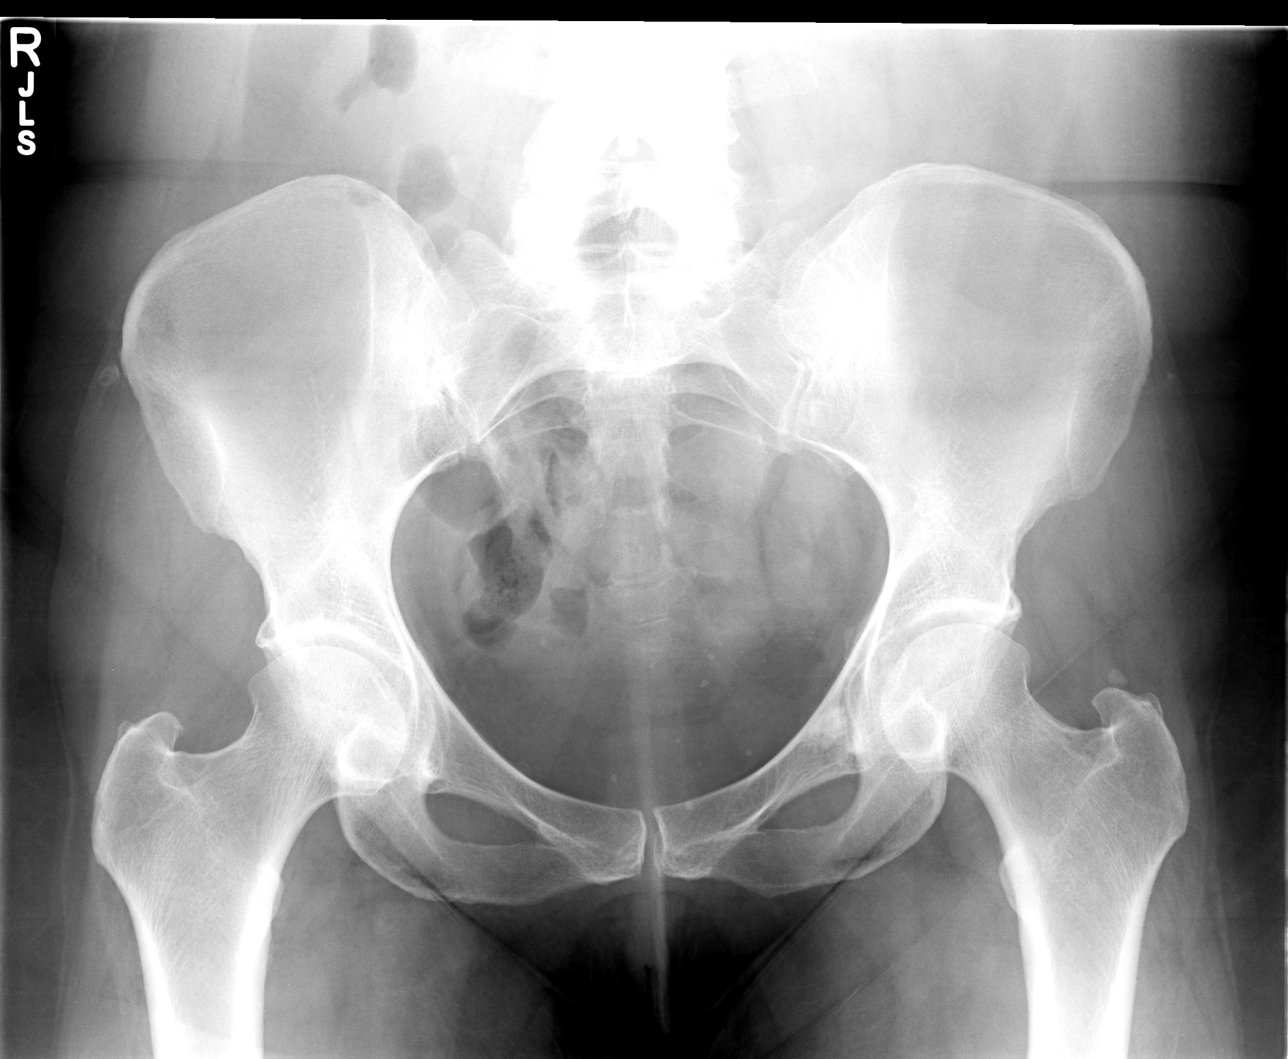

[view not recorded (2 of 3)]
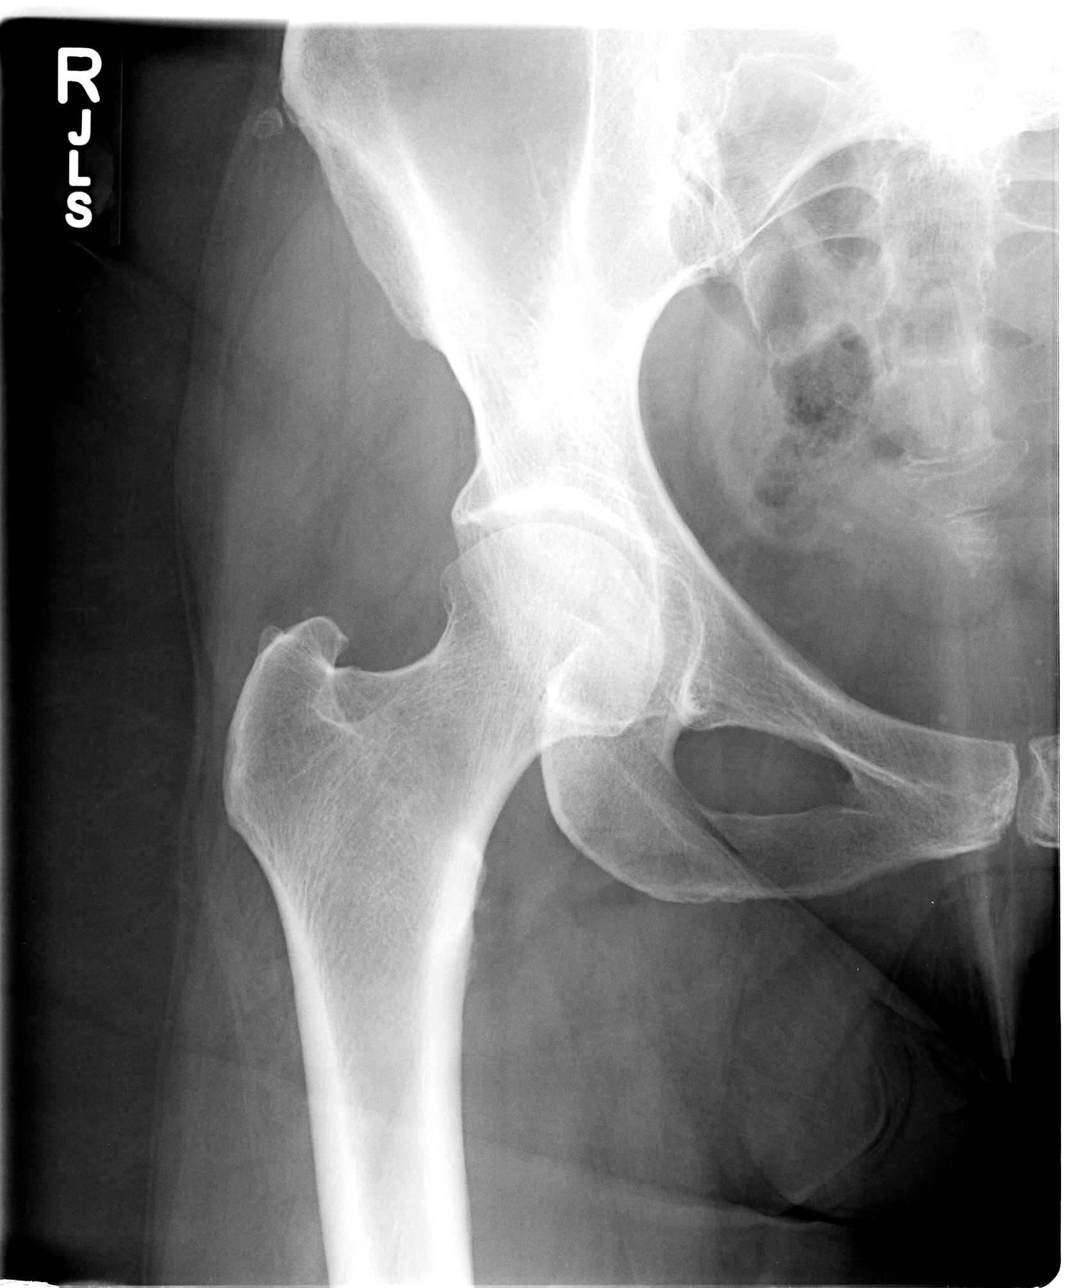

[view not recorded (3 of 3)]
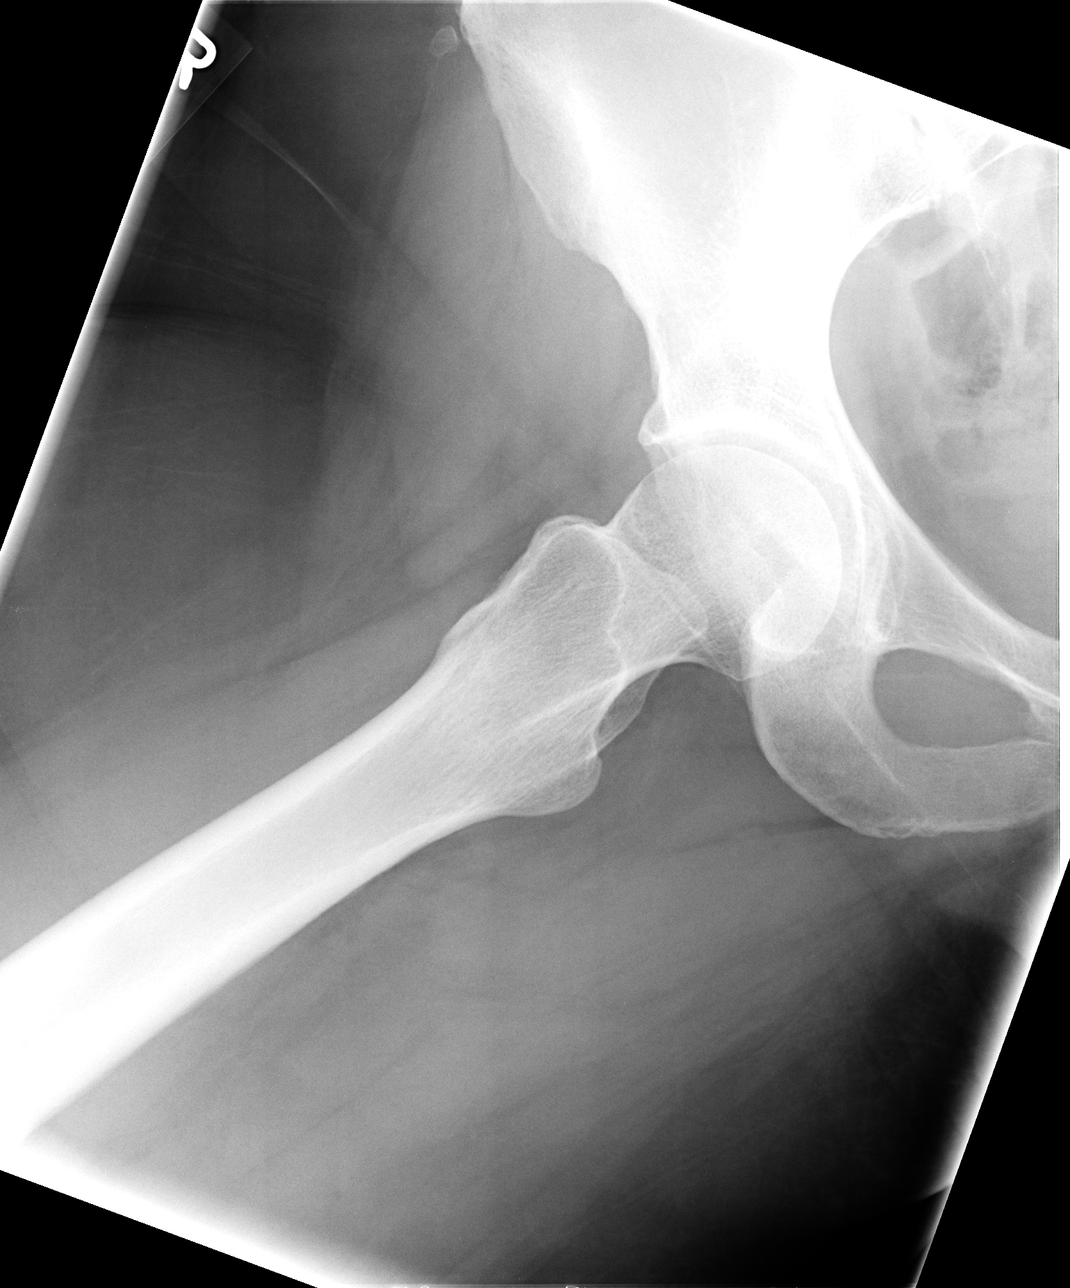

[3 of 3 positions shown; findings below may reference images not displayed]

FINDINGS: Five lumbar-type vertebral bodies are in stable alignment.  There is stable mild disk space loss of L3-4.  No acute fracture, pars defect, or significant facet disease is demonstrated.
IMPRESSION: Stable examination.  No acute findings. 
 RIGHT HIP ? 2 VIEW:
FINDINGS: There is no evidence of acute fracture or dislocation.  The right femoral head appears normal.  The hip joint spaces are preserved.  Mineralization is normal.  Well-corticated ossific densities adjacent to the right iliac crest and the left greater trochanter are unchanged from the abdominal radiographs on 10/28/06.  There is a bone island in the left superior pubic ramus.
IMPRESSION: Stable examination.  No acute osseous findings.

## 2008-06-18 IMAGING — CR DG CHEST 2V
2 series · 2 of 2 positions shown · non-contrast
Comparison: 03/16/07.

CLINICAL DATA: Cough and chest pain.
 CHEST - 2 VIEW:
 PA and lateral chest - 10/08/07.

[w chest pa *]
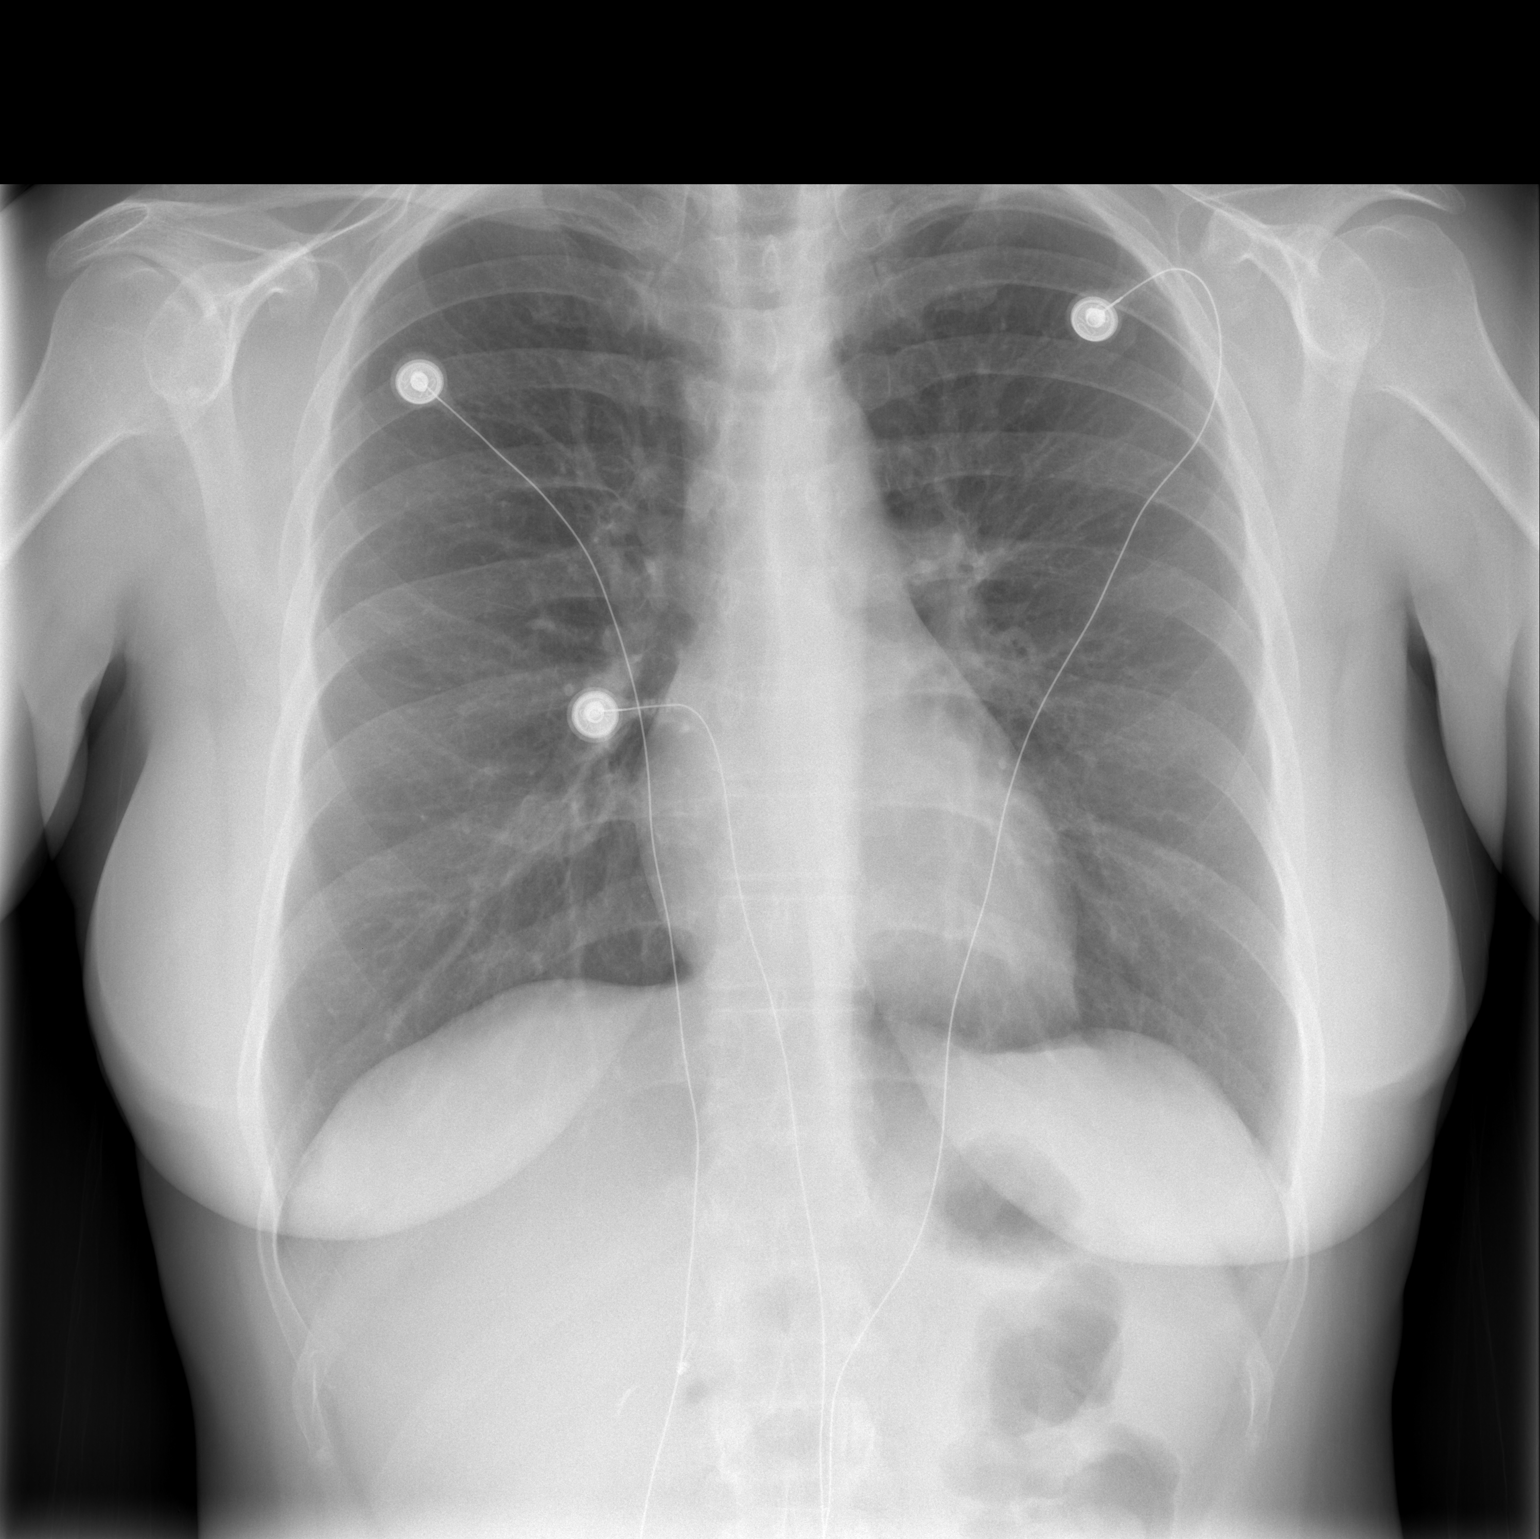

[w chest lat *]
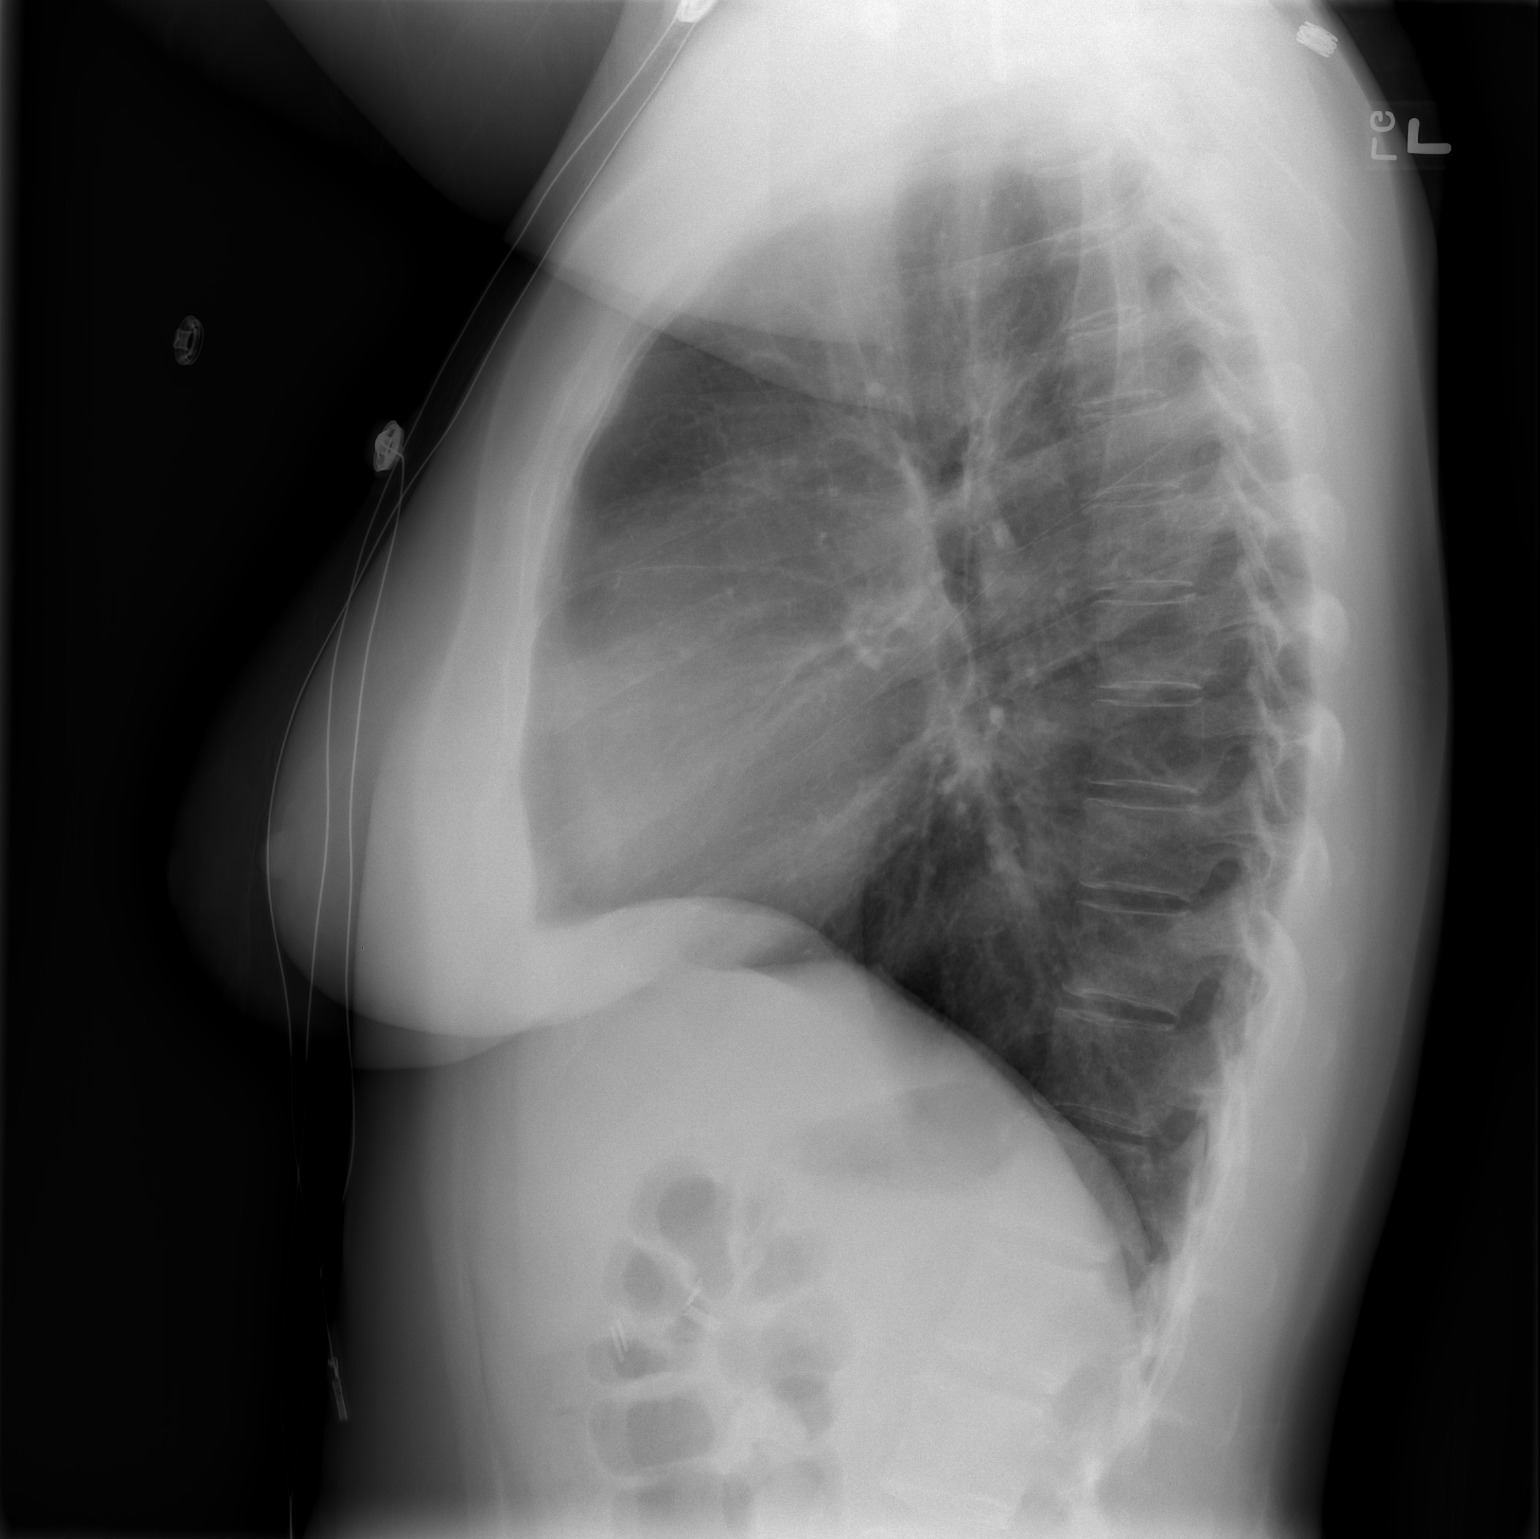

[2 of 2 positions shown; findings below may reference images not displayed]

FINDINGS: The lungs are clear.   No pleural effusion. The heart size is normal.   No focal bony abnormality.
IMPRESSION: No acute disease.

## 2008-06-19 ENCOUNTER — Emergency Department (HOSPITAL_COMMUNITY): Admission: EM | Admit: 2008-06-19 | Discharge: 2008-06-19 | Payer: Self-pay | Admitting: Family Medicine

## 2008-08-05 ENCOUNTER — Ambulatory Visit: Payer: Self-pay | Admitting: Internal Medicine

## 2008-09-29 ENCOUNTER — Emergency Department (HOSPITAL_COMMUNITY): Admission: EM | Admit: 2008-09-29 | Discharge: 2008-09-30 | Payer: Self-pay | Admitting: Emergency Medicine

## 2008-10-08 ENCOUNTER — Encounter: Admission: RE | Admit: 2008-10-08 | Discharge: 2008-10-08 | Payer: Self-pay | Admitting: Internal Medicine

## 2008-10-08 ENCOUNTER — Ambulatory Visit: Payer: Self-pay | Admitting: Internal Medicine

## 2008-10-25 ENCOUNTER — Ambulatory Visit: Payer: Self-pay | Admitting: Internal Medicine

## 2008-12-12 IMAGING — CT CT ABDOMEN W/ CM
2 of 5 series · 17 of 46 positions shown, 19 images · IV contrast (READICAT/WATER & [ID] OMNI 300)
Comparison: 04/07/2006

CT ABDOMEN

CLINICAL DATA: Quadrant pain with nausea and vomiting.

CT ABDOMEN AND PELVIS WITH CONTRAST
TECHNIQUE: Multidetector CT imaging of the abdomen and pelvis was
performed using the standard protocol following bolus
administration of intravenous contrast.
Contrast: 100 ml Tmnipaque-L66

[Series 2: abdomen w/ · axial · 0.70mm/px · z∈[-398,-23]mm · 14 of 82 slices shown, 16 images]
[im 5/82  soft-tissue]
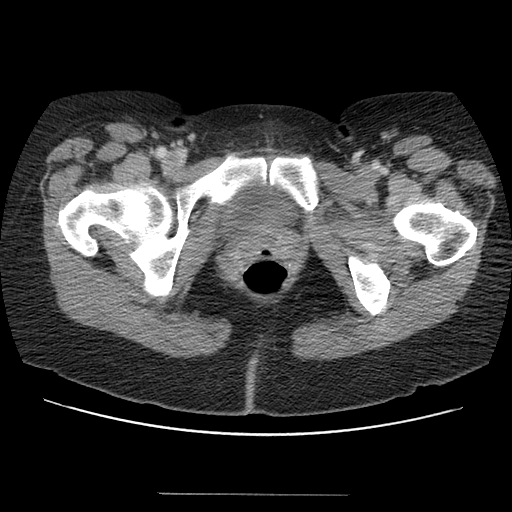
[im 5/82  bone]
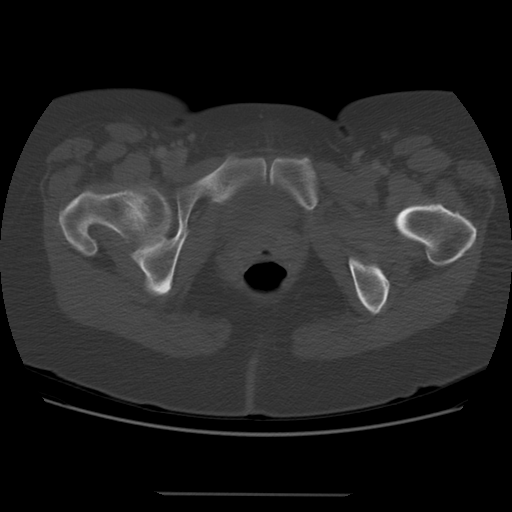
[im 10/82  soft-tissue]
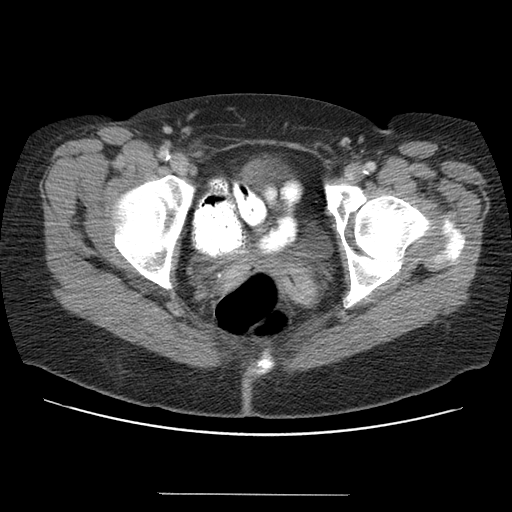
[im 15/82  soft-tissue]
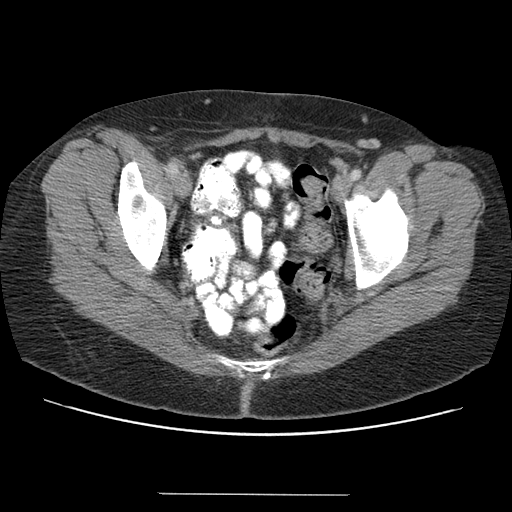
[im 24/82  soft-tissue]
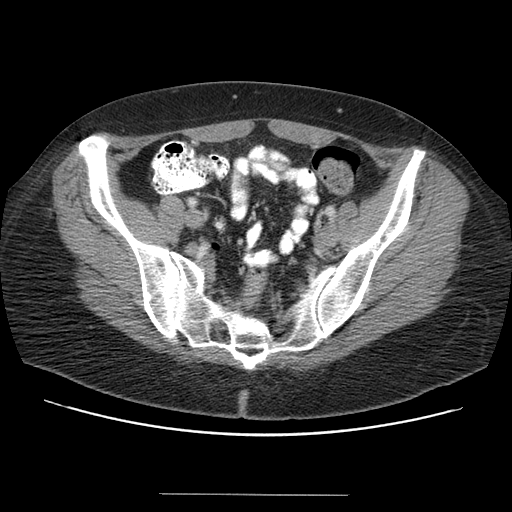
[im 29/82  soft-tissue]
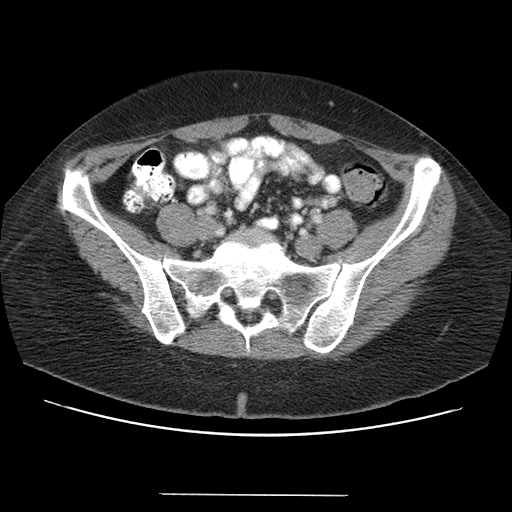
[im 34/82  soft-tissue]
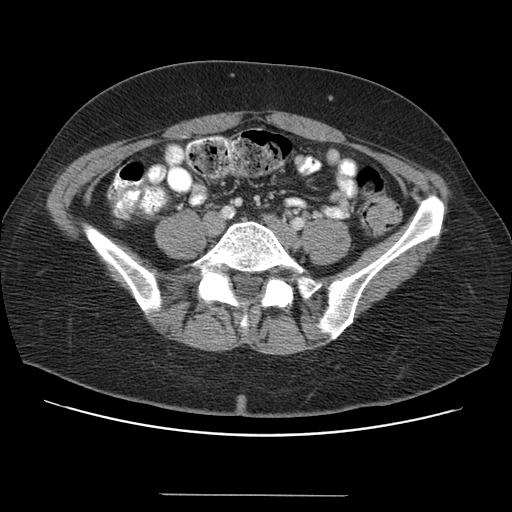
[im 39/82  soft-tissue]
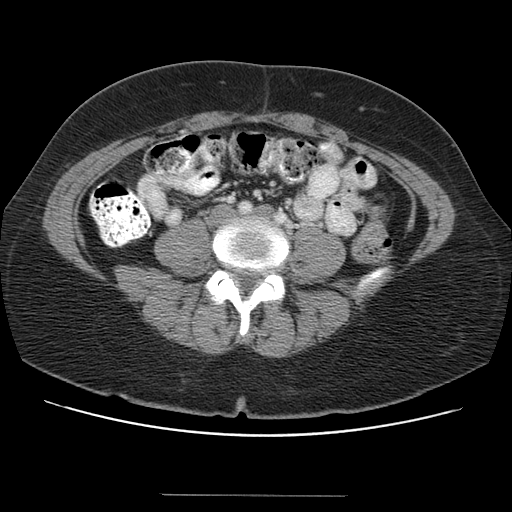
[im 43/82  soft-tissue]
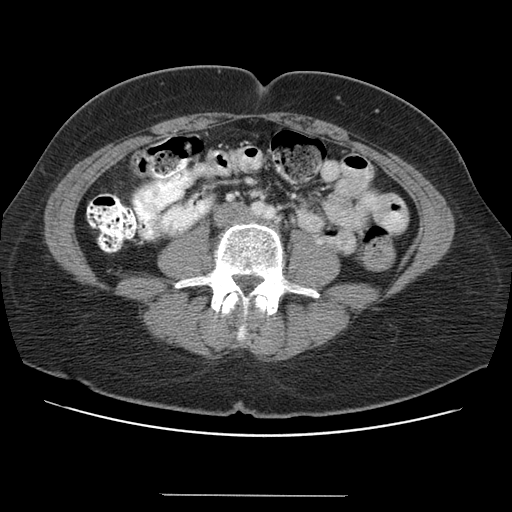
[im 48/82  soft-tissue]
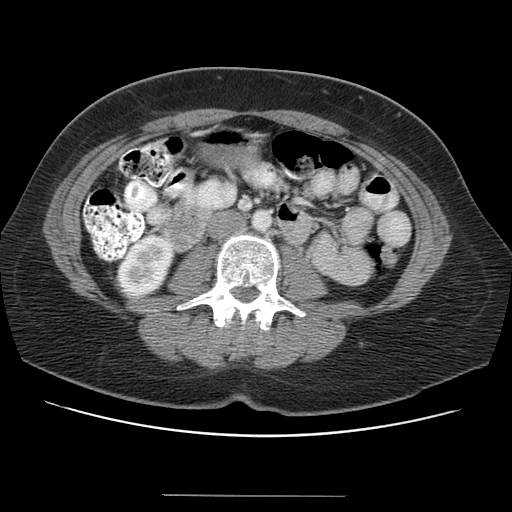
[im 48/82  bone]
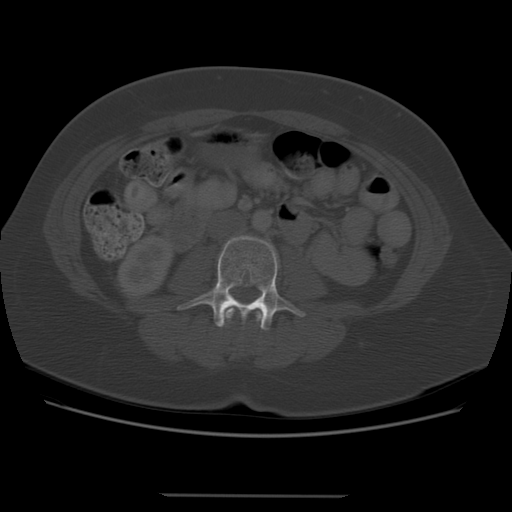
[im 53/82  soft-tissue]
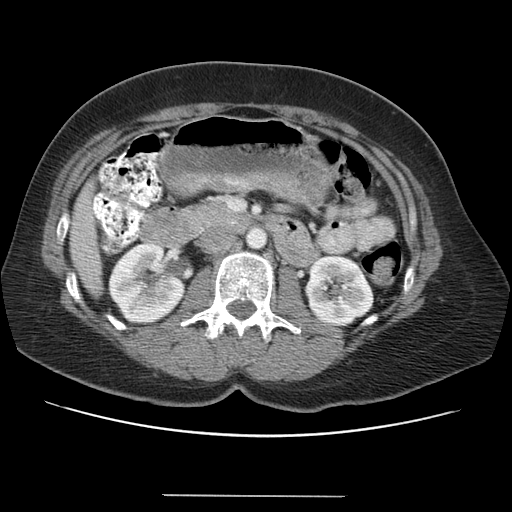
[im 62/82  soft-tissue]
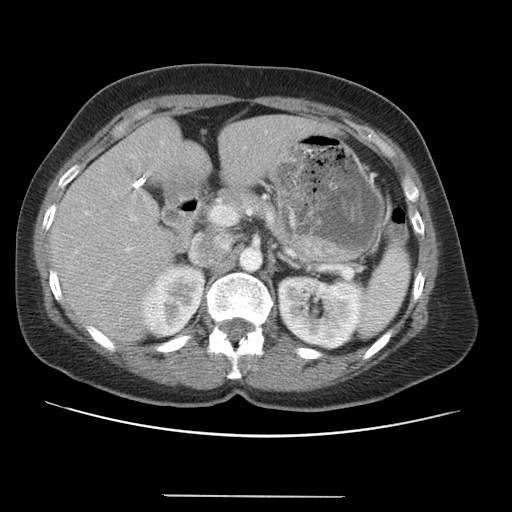
[im 67/82  soft-tissue]
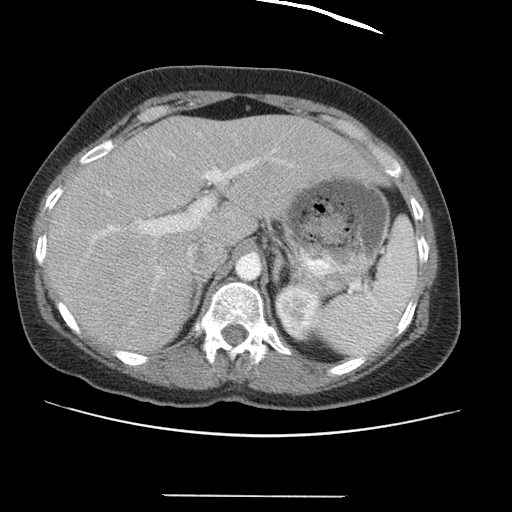
[im 72/82  soft-tissue]
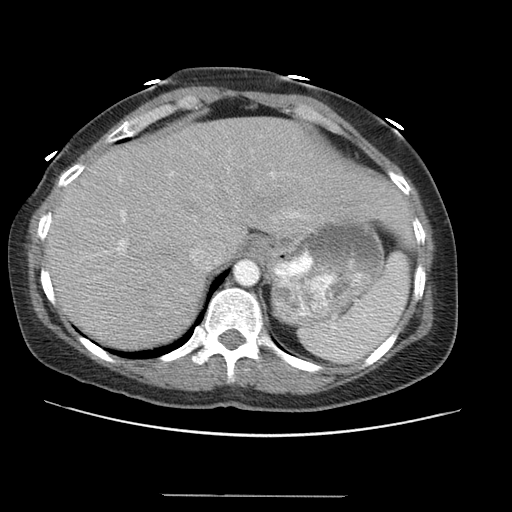
[im 77/82  soft-tissue]
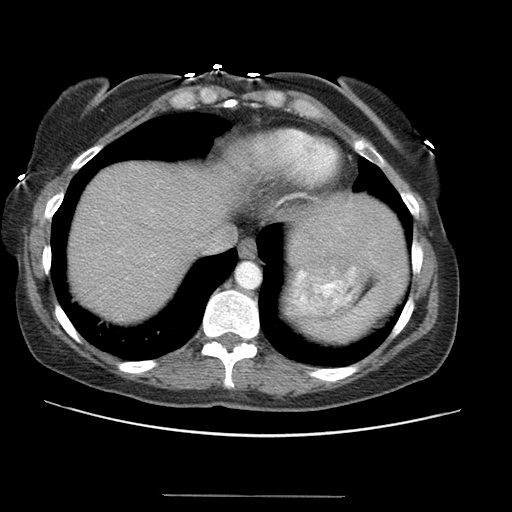

[Series 400: cor · coronal · 0.90mm/px · 3 of 97 slices shown]
[im 33/97  soft-tissue]
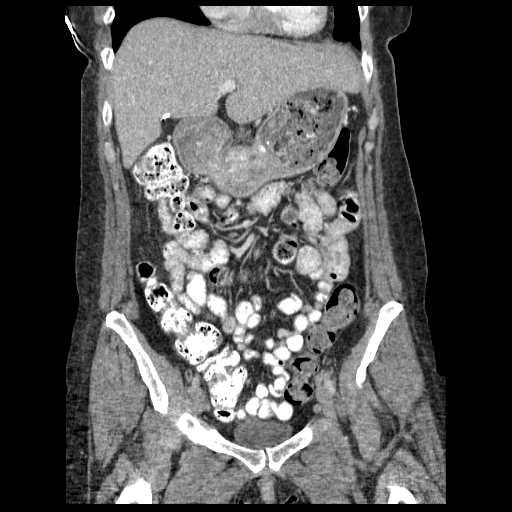
[im 43/97  soft-tissue]
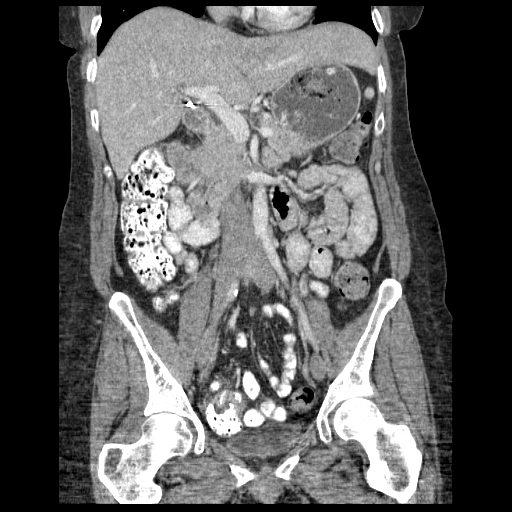
[im 54/97  soft-tissue]
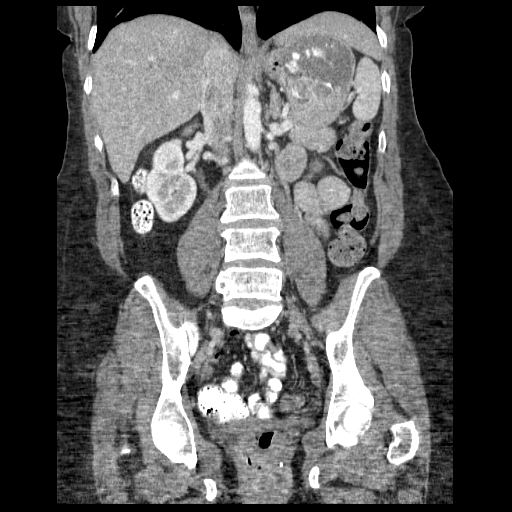

[17 of 46 positions shown; findings below may reference images not displayed]

FINDINGS: The liver, spleen, stomach, duodenum, pancreas, adrenal
glands, and kidneys have normal imaging features.

There is no free intraperitoneal fluid.  No abdominal
lymphadenopathy.  Visualized abdominal bowel loops are normal in
appearance.
IMPRESSION: No acute findings in the abdomen.

CT PELVIS
FINDINGS: No intraperitoneal free fluid.  No pelvic
lymphadenopathy.  18 mm dominant follicle seen in the right ovary.
Left ovary is unremarkable.  Uterus is surgically absent.  Bladder
is unremarkable.

No evidence for colonic diverticulitis.  The terminal ileum is
normal.  The appendix is normal.

Bone windows show no focal lytic or sclerotic osseous lesions.
IMPRESSION: No acute findings in the anatomic pelvis.  Specifically, the
terminal ileum and appendix are normal in this patient with right
lower quadrant pain.  No intraperitoneal free fluid.

## 2008-12-20 ENCOUNTER — Ambulatory Visit (HOSPITAL_COMMUNITY): Admission: RE | Admit: 2008-12-20 | Discharge: 2008-12-20 | Payer: Self-pay | Admitting: Internal Medicine

## 2008-12-31 DIAGNOSIS — I429 Cardiomyopathy, unspecified: Secondary | ICD-10-CM | POA: Insufficient documentation

## 2008-12-31 DIAGNOSIS — E039 Hypothyroidism, unspecified: Secondary | ICD-10-CM

## 2008-12-31 DIAGNOSIS — E785 Hyperlipidemia, unspecified: Secondary | ICD-10-CM | POA: Insufficient documentation

## 2008-12-31 DIAGNOSIS — R079 Chest pain, unspecified: Secondary | ICD-10-CM | POA: Insufficient documentation

## 2008-12-31 DIAGNOSIS — E663 Overweight: Secondary | ICD-10-CM | POA: Insufficient documentation

## 2008-12-31 DIAGNOSIS — I10 Essential (primary) hypertension: Secondary | ICD-10-CM

## 2009-01-01 ENCOUNTER — Ambulatory Visit: Payer: Self-pay | Admitting: Cardiology

## 2009-01-01 ENCOUNTER — Encounter: Payer: Self-pay | Admitting: Physician Assistant

## 2009-01-01 DIAGNOSIS — R002 Palpitations: Secondary | ICD-10-CM | POA: Insufficient documentation

## 2009-01-07 ENCOUNTER — Telehealth (INDEPENDENT_AMBULATORY_CARE_PROVIDER_SITE_OTHER): Payer: Self-pay | Admitting: *Deleted

## 2009-01-08 ENCOUNTER — Encounter: Payer: Self-pay | Admitting: Cardiovascular Disease

## 2009-01-08 ENCOUNTER — Ambulatory Visit: Payer: Self-pay

## 2009-01-16 LAB — CONVERTED CEMR LAB
Cholesterol: 183 mg/dL (ref 0–200)
HDL: 31.2 mg/dL — ABNORMAL LOW (ref >39.00)
LDL Cholesterol: 129 mg/dL — ABNORMAL HIGH (ref 0–99)
Triglycerides: 112 mg/dL (ref 0.0–149.0)
VLDL: 22.4 mg/dL (ref 0.0–40.0)

## 2009-01-24 ENCOUNTER — Ambulatory Visit: Payer: Self-pay | Admitting: Internal Medicine

## 2009-02-03 ENCOUNTER — Emergency Department (HOSPITAL_COMMUNITY): Admission: EM | Admit: 2009-02-03 | Discharge: 2009-02-03 | Payer: Self-pay | Admitting: Emergency Medicine

## 2009-05-19 ENCOUNTER — Ambulatory Visit: Payer: Self-pay | Admitting: Internal Medicine

## 2009-06-10 IMAGING — CR DG CHEST 2V
2 series · 2 of 2 positions shown · non-contrast
Comparison: PA and lateral chest 10/08/2007.

CLINICAL DATA: Fever and cough.

CHEST - 2 VIEW

[w chest pa]
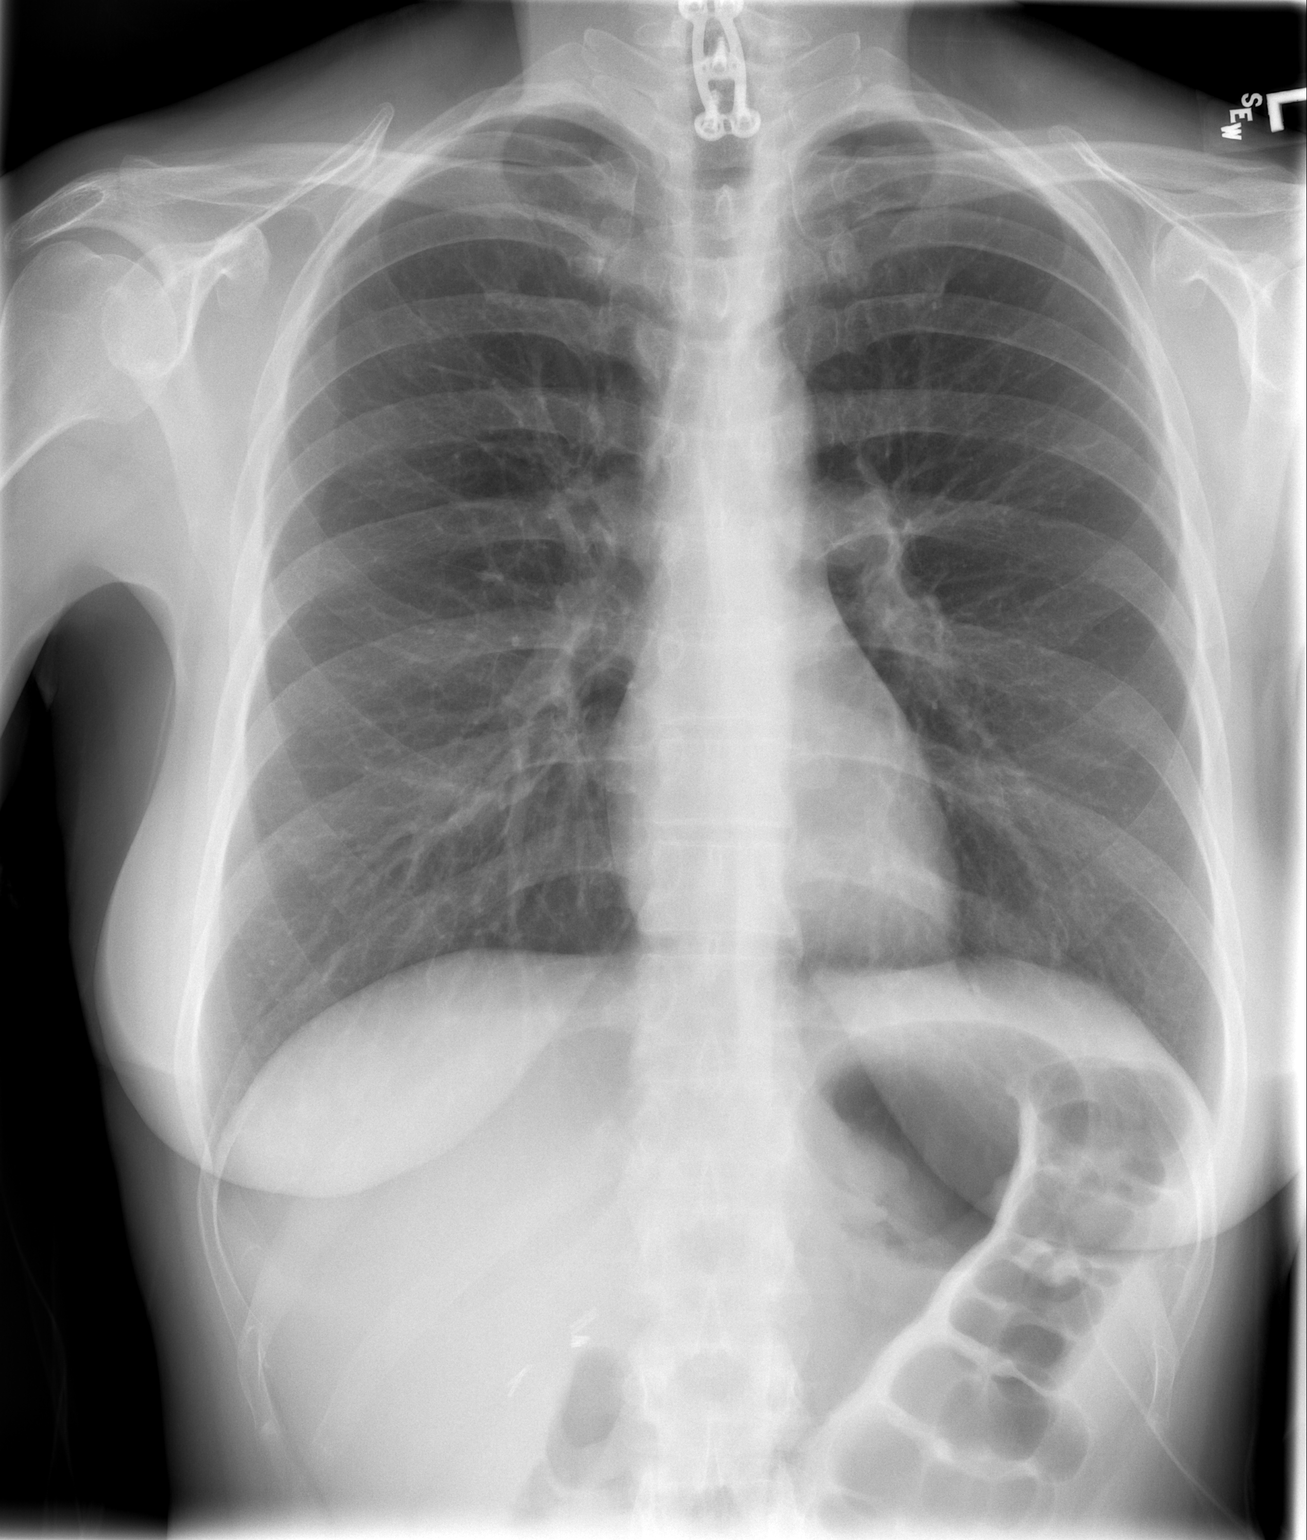

[w chest lat]
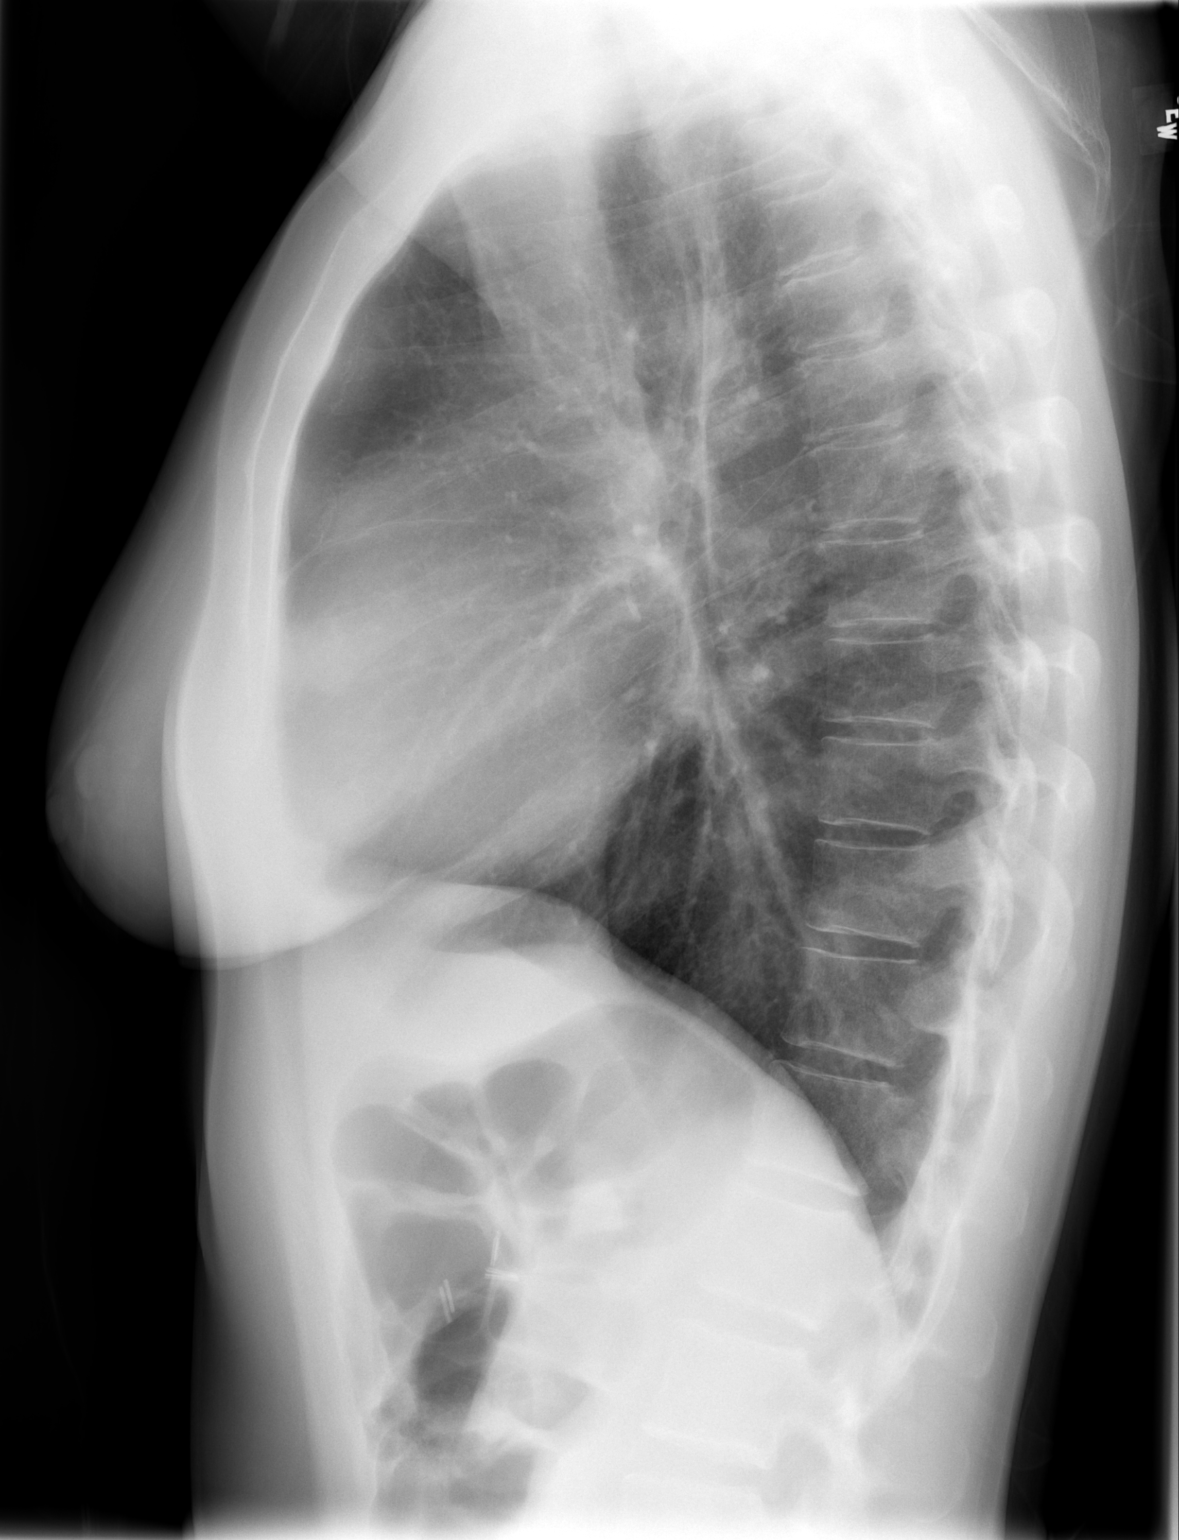

[2 of 2 positions shown; findings below may reference images not displayed]

FINDINGS: The lungs are clear.  There is no pleural effusion.
Heart size is normal.  No focal bony abnormality.
IMPRESSION: Negative chest.

## 2009-06-19 IMAGING — CR DG HIP COMPLETE 2+V*R*
2 series · 2 of 2 positions shown · non-contrast
Comparison: [HOSPITAL] AP pelvis right hip radiographs
08/20/2007.

CLINICAL DATA: Post fall injury with right hip pain.

RIGHT HIP - COMPLETE 2+ VIEW

[view not recorded (1 of 2)]
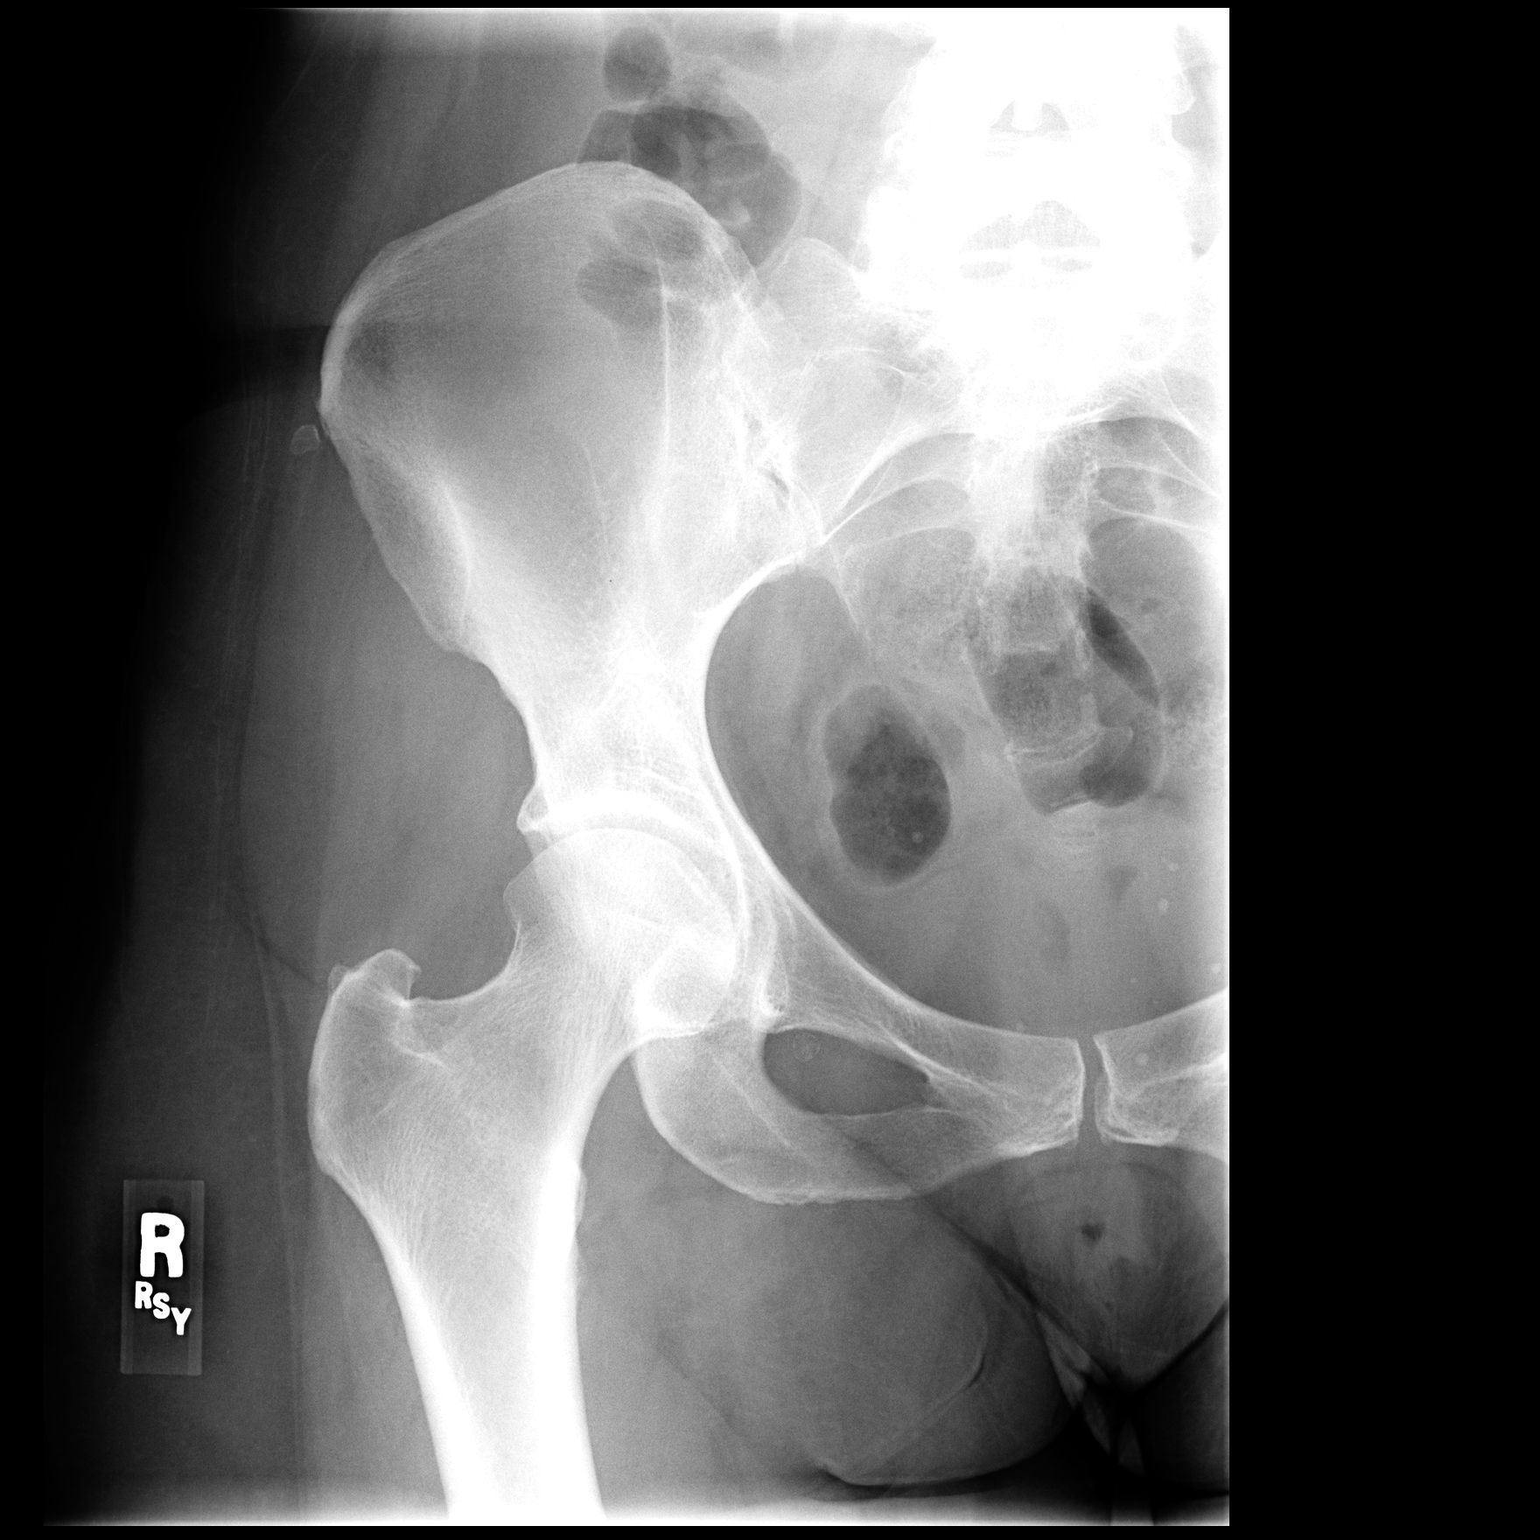

[view not recorded (2 of 2)]
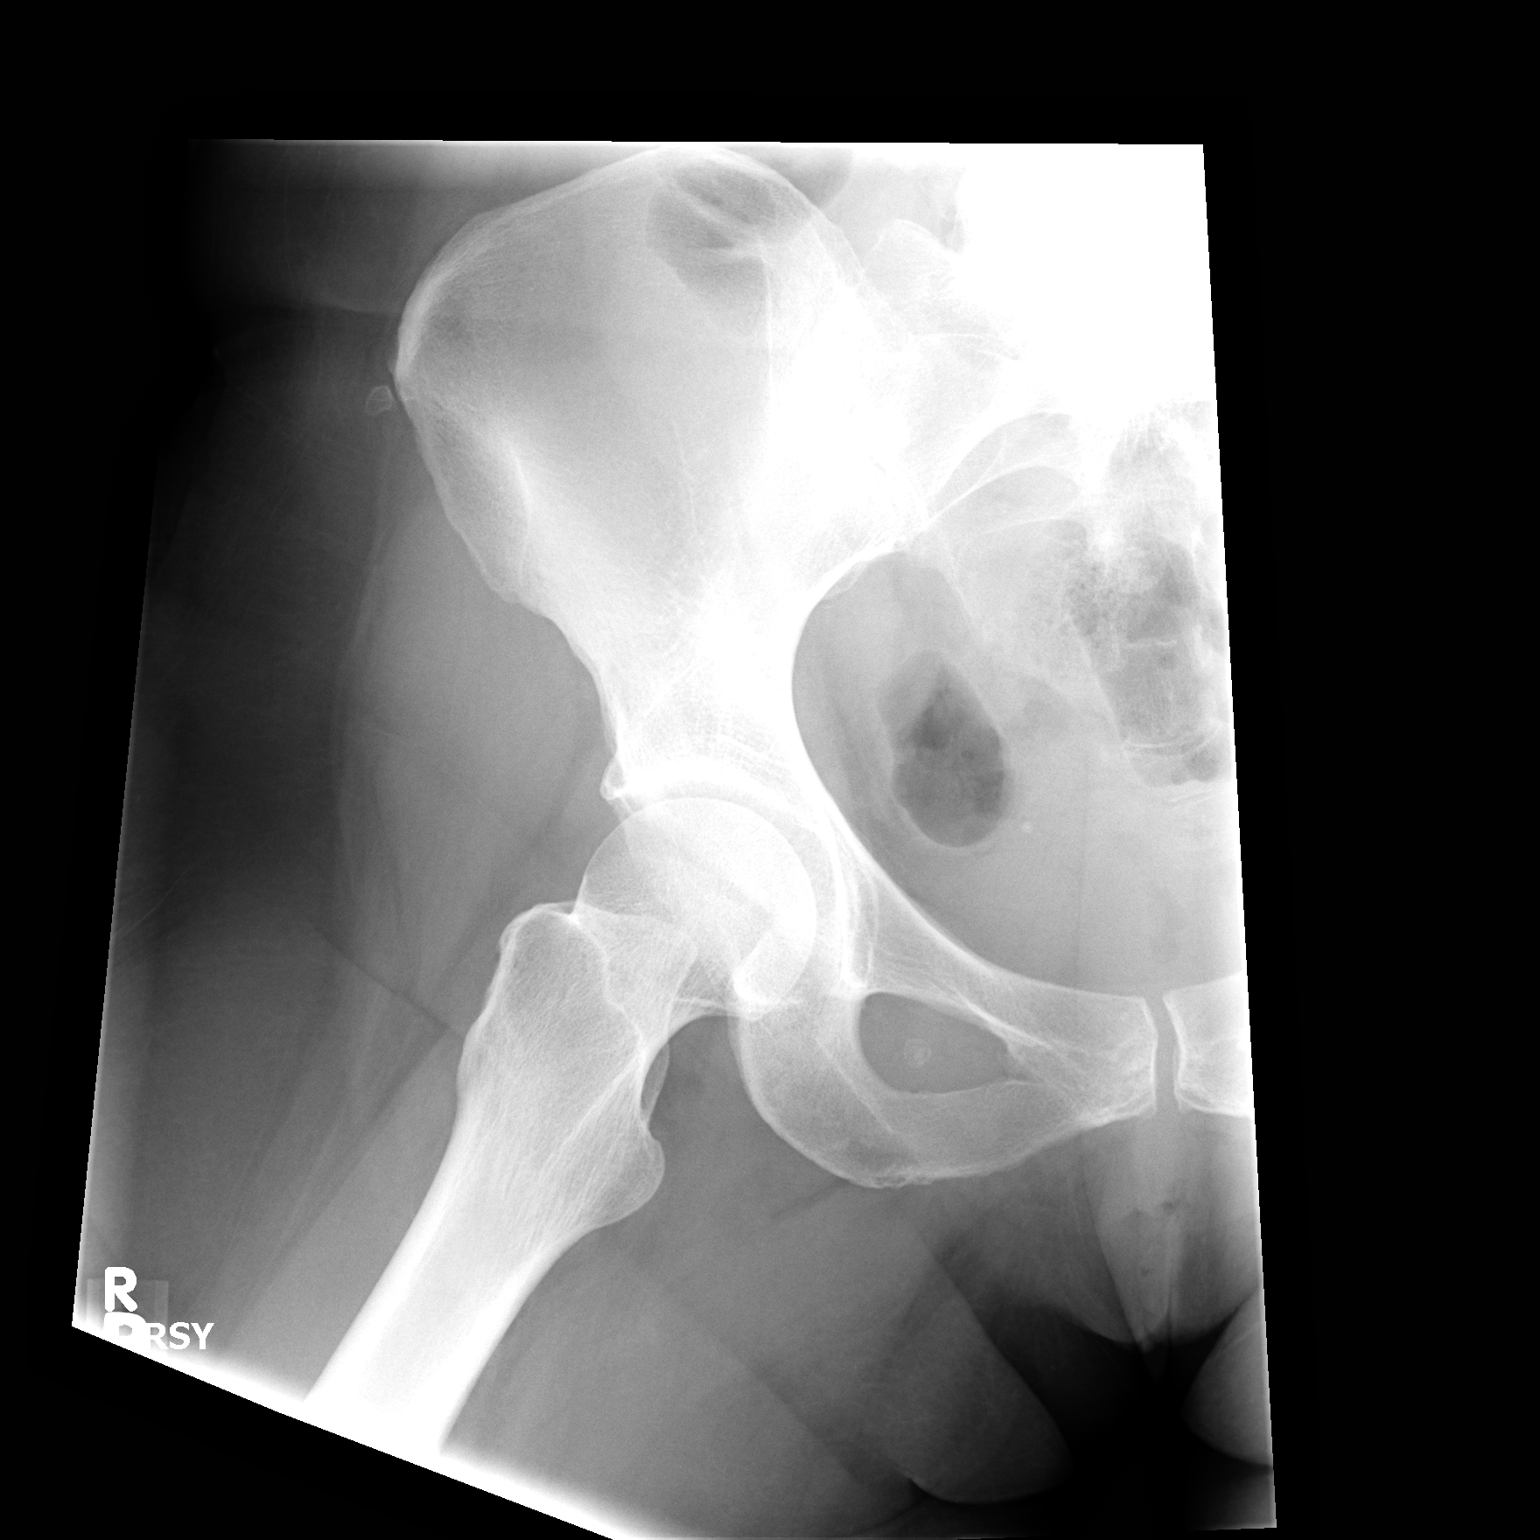

[2 of 2 positions shown; findings below may reference images not displayed]

FINDINGS: Stable 6 mm well corticated calcific or ossific densitiy
is seen at the lateral superior right iliac bone.  No acute
fracture, subluxation, dislocation, significant arthritis seen.
IMPRESSION: Stable.  Negative.

REF:G3 DICTATED: 10/08/2008 [DATE]

## 2009-08-20 ENCOUNTER — Encounter: Admission: RE | Admit: 2009-08-20 | Discharge: 2009-08-20 | Payer: Self-pay | Admitting: Gastroenterology

## 2009-08-31 IMAGING — MR MR LUMBAR SPINE W/O CM
4 of 6 series · 12 of 48 positions shown · non-contrast
Comparison: Lumbar spine radiographs 08/20/2007 and abdominal CT
04/02/2008.

CLINICAL DATA: Low back pain radiating to the right buttock and hip
since falling 9 or 10 months ago.

MRI LUMBAR SPINE WITHOUT CONTRAST
TECHNIQUE: Multiplanar and multiecho pulse sequences of the lumbar
spine were obtained without intravenous contrast.

[Series 3: T2 · sagittal · 4.0mm · 0.39mm/px · 3 of 13 slices shown (1 of 2)]
[im 3/13]
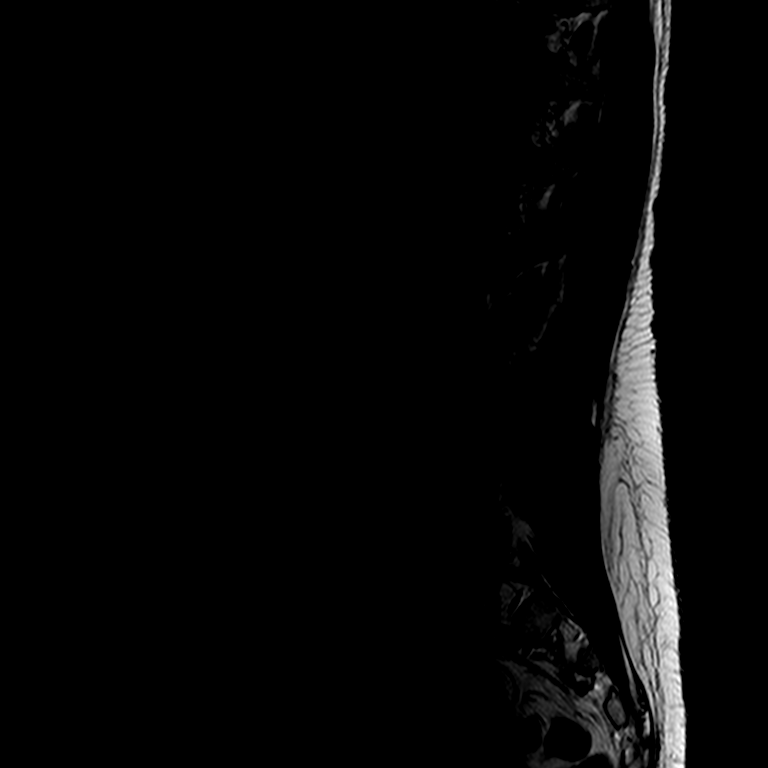
[im 8/13]
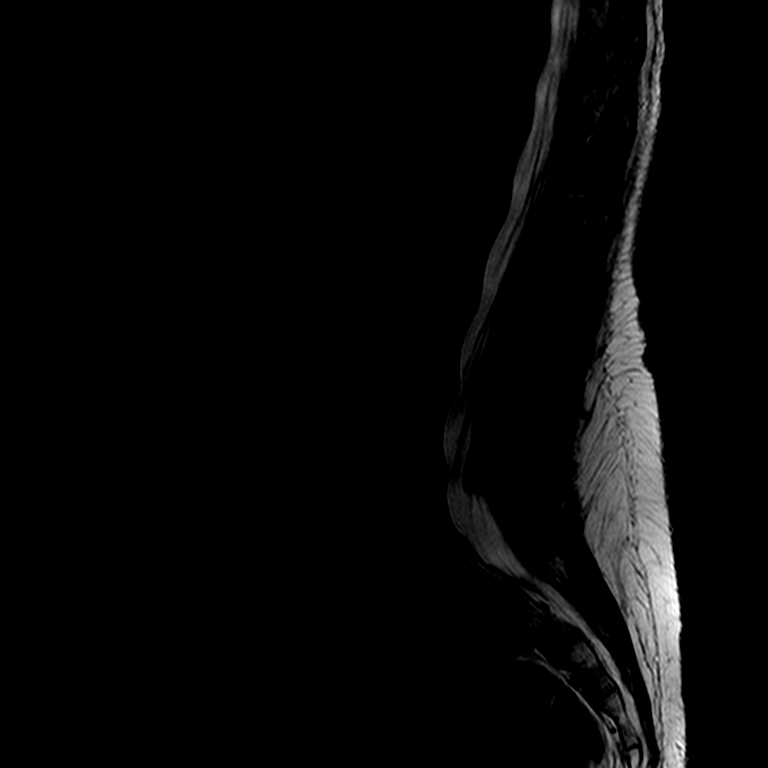
[im 13/13]
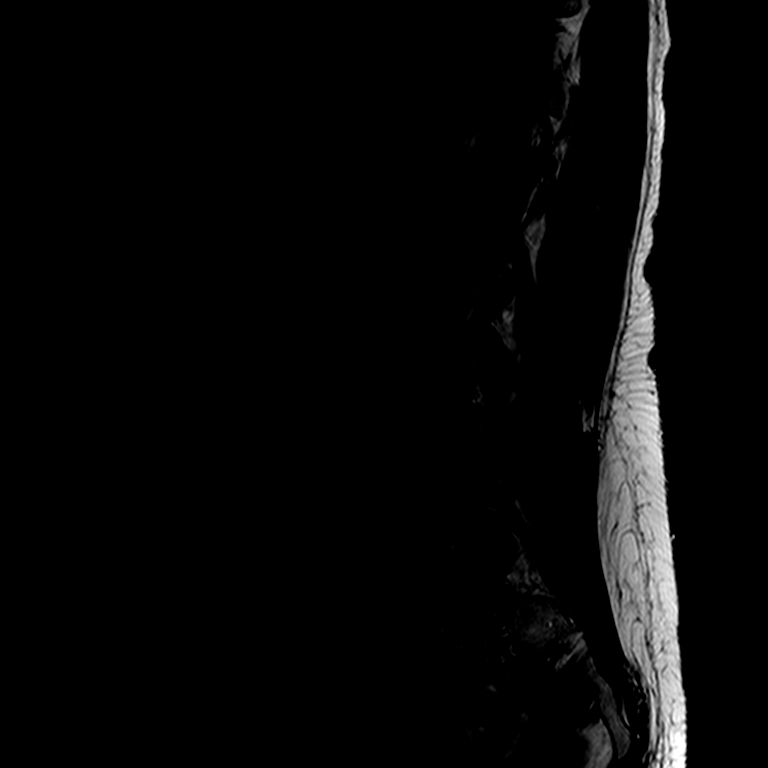

[Series 4: T1 · sagittal · 4.0mm · 0.47mm/px · 3 of 13 slices shown (1 of 2)]
[im 3/13]
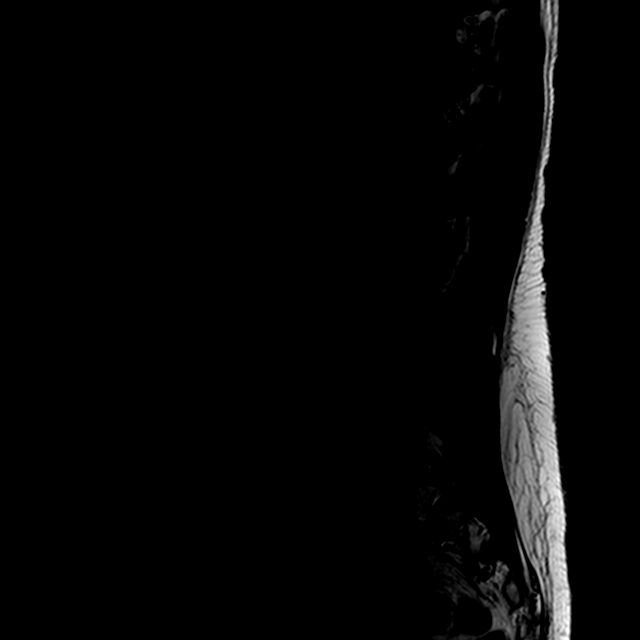
[im 8/13]
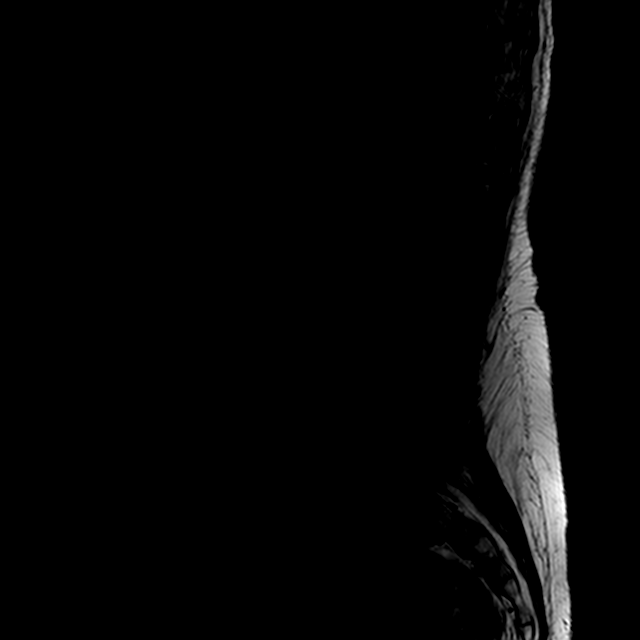
[im 13/13]
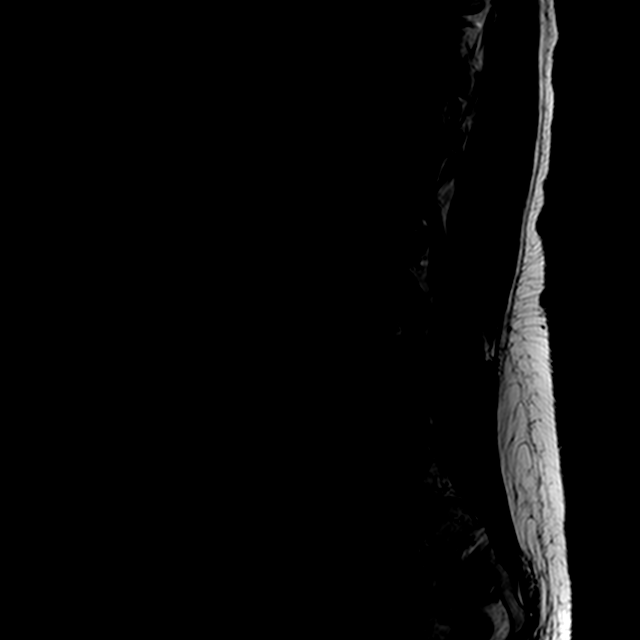

[Series 7: T2 · axial · 4.0mm · 0.24mm/px · z∈[-96,+94]mm · 3 of 25 slices shown (2 of 2)]
[im 5/25]
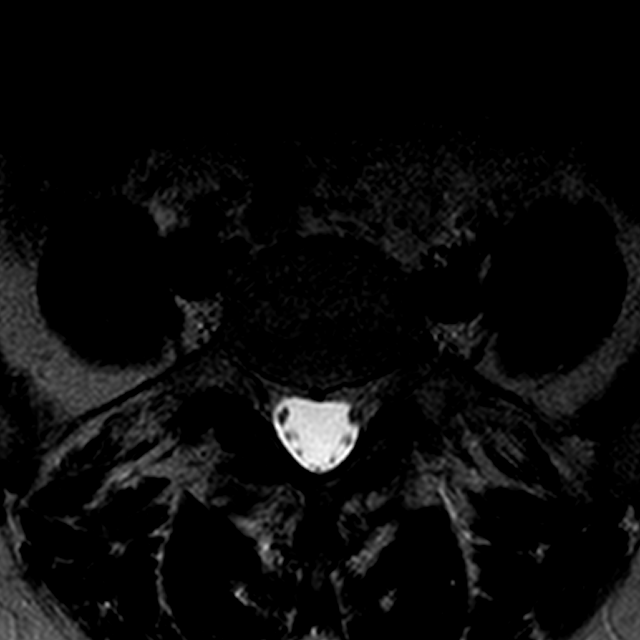
[im 14/25]
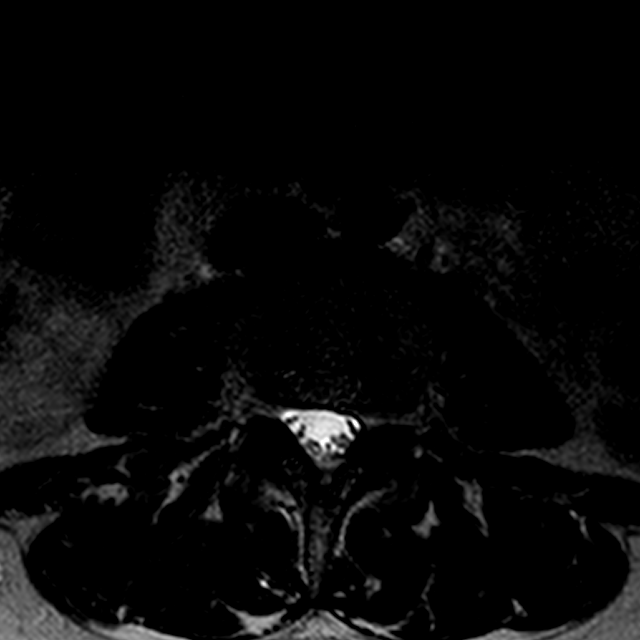
[im 22/25]
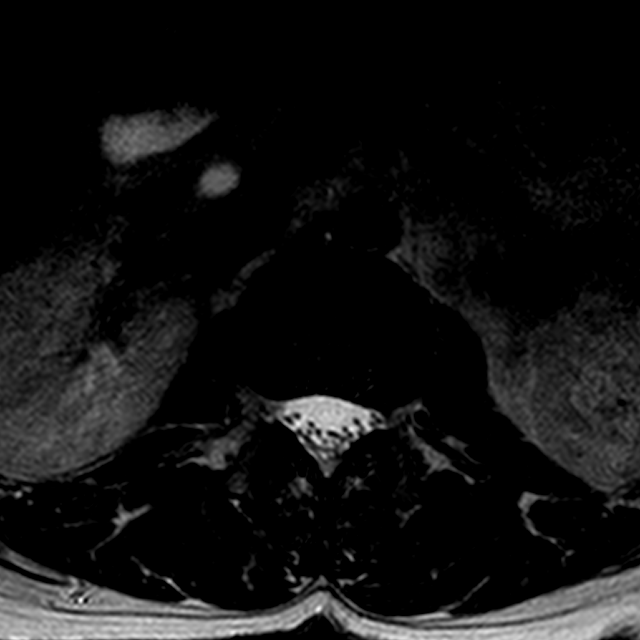

[Series 8: T1 · axial · 4.0mm · 0.28mm/px · z∈[-94,+93]mm · 3 of 26 slices shown (2 of 2)]
[im 5/26]
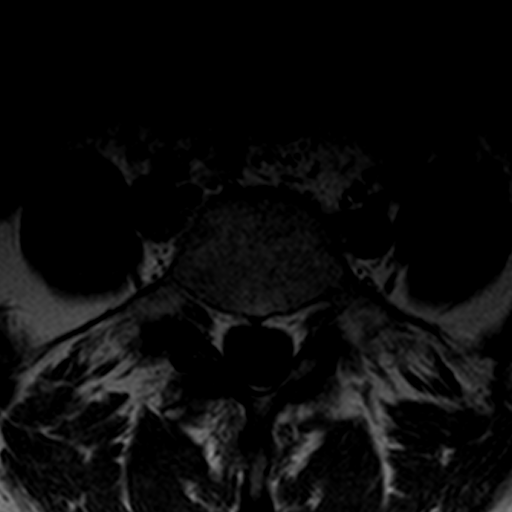
[im 14/26]
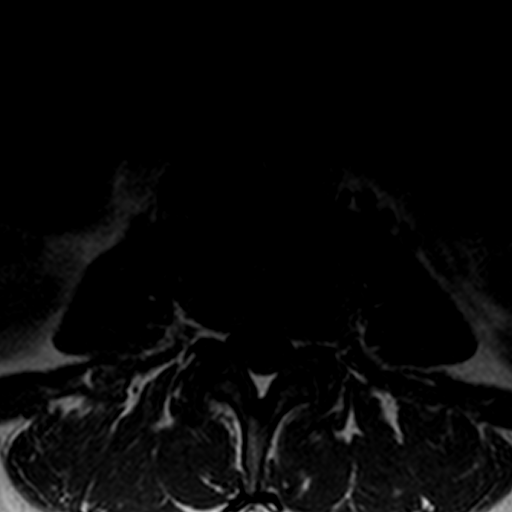
[im 23/26]
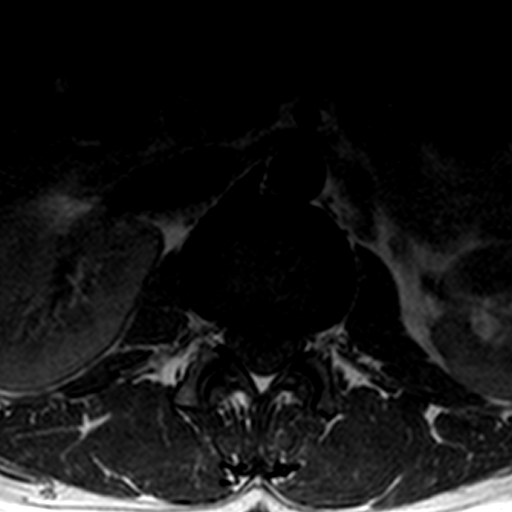

[12 of 48 positions shown; findings below may reference images not displayed]

FINDINGS: There are five lumbar type vertebral bodies in anatomic
alignment.  There is no fracture or pars defect.

The conus medullaris extends to the lower L1 level and appears
normal.  There are no paraspinal abnormalities.

No significant disc space findings are present from T11-T12 through
L2-L3.

L3-L4:  There is annular disc bulging with a broad-based foraminal
and extraforaminal disc protrusion on the left.  There is bilateral
facet hypertrophy and ligamentum flavum thickening contributing to
mild left lateral recess and moderate left foraminal stenosis.
There may be encroachment on the left L3 and L4 nerve roots.  No
right-sided nerve root encroachment is seen.

L4-L5:  There is annular disc bulging with a broad-based central
and left-sided disc protrusion contributing to mild central, left
lateral recess and left foraminal stenosis.  The right foramen
appears adequately patent, but there is mild right lateral recess
stenosis.  There is facet joint and ligamentous hypertrophy.

L5-S1:  Disc height and hydration are maintained.  There are mild
to moderate facet degenerative changes bilaterally, but no
resulting spinal stenosis or nerve root encroachment.
IMPRESSION: 1.  No definite explanation for the patient's right sided radicular
symptoms is identified.  The right lateral recess at L4-L5 is
slightly narrowed with possible resulting right L5 nerve root
encroachment.
2.  At both L3-L4 and L4-L5, there is more significant left lateral
recess and foraminal stenosis due to broad-based disc protrusions.
Left-sided nerve root encroachment at those levels is possible.
Compared with the prior abdominal pelvic CT, these appear
longstanding, however.
3.  Lower lumbar spine facet degenerative changes.  No acute
osseous findings.

## 2009-09-29 ENCOUNTER — Ambulatory Visit: Payer: Self-pay | Admitting: Internal Medicine

## 2009-10-03 ENCOUNTER — Telehealth: Payer: Self-pay | Admitting: Internal Medicine

## 2009-10-03 ENCOUNTER — Ambulatory Visit (HOSPITAL_COMMUNITY): Admission: RE | Admit: 2009-10-03 | Discharge: 2009-10-03 | Payer: Self-pay | Admitting: Family Medicine

## 2009-10-15 ENCOUNTER — Other Ambulatory Visit: Admission: RE | Admit: 2009-10-15 | Discharge: 2009-10-15 | Payer: Self-pay | Admitting: Gynecology

## 2009-10-15 ENCOUNTER — Ambulatory Visit: Payer: Self-pay | Admitting: Gynecology

## 2009-10-17 ENCOUNTER — Ambulatory Visit: Payer: Self-pay | Admitting: Gynecology

## 2009-11-17 ENCOUNTER — Ambulatory Visit (HOSPITAL_COMMUNITY): Admission: RE | Admit: 2009-11-17 | Discharge: 2009-11-17 | Payer: Self-pay | Admitting: Internal Medicine

## 2010-01-20 ENCOUNTER — Inpatient Hospital Stay (HOSPITAL_COMMUNITY): Admission: EM | Admit: 2010-01-20 | Discharge: 2010-01-27 | Payer: Self-pay | Admitting: Emergency Medicine

## 2010-02-01 ENCOUNTER — Emergency Department (HOSPITAL_COMMUNITY): Admission: EM | Admit: 2010-02-01 | Discharge: 2010-02-02 | Payer: Self-pay | Admitting: Emergency Medicine

## 2010-02-12 ENCOUNTER — Encounter: Payer: Self-pay | Admitting: Internal Medicine

## 2010-02-12 ENCOUNTER — Ambulatory Visit (HOSPITAL_COMMUNITY): Admission: RE | Admit: 2010-02-12 | Discharge: 2010-02-12 | Payer: Self-pay | Admitting: Internal Medicine

## 2010-02-16 ENCOUNTER — Ambulatory Visit (HOSPITAL_BASED_OUTPATIENT_CLINIC_OR_DEPARTMENT_OTHER): Admission: RE | Admit: 2010-02-16 | Discharge: 2010-02-16 | Payer: Self-pay | Admitting: Otolaryngology

## 2010-04-15 ENCOUNTER — Emergency Department (HOSPITAL_COMMUNITY): Admission: EM | Admit: 2010-04-15 | Discharge: 2010-04-15 | Payer: Self-pay | Admitting: Emergency Medicine

## 2010-04-16 ENCOUNTER — Inpatient Hospital Stay (HOSPITAL_COMMUNITY): Admission: EM | Admit: 2010-04-16 | Discharge: 2010-04-19 | Payer: Self-pay | Admitting: Emergency Medicine

## 2010-05-01 IMAGING — CT CT PELVIS W/ CM
3 of 5 series · 13 of 36 positions shown, 19 images · IV contrast (agent unspecified)
Comparison: CT abdomen pelvis of 02/03/2009

CT ABDOMEN

CLINICAL DATA: Left upper quadrant pain, nausea and vomiting, some
weight loss

CT ABDOMEN AND PELVIS WITH CONTRAST
TECHNIQUE: Multidetector CT imaging of the abdomen and pelvis was
performed using the standard protocol following bolus
administration of intravenous contrast.
Contrast: 100 ml Xmnipaque-FQQ

[Series 3: routine abdomen · axial · 0.66mm/px · z∈[-404,-89]mm · 8 of 83 slices shown, 13 images]
[im 10/83  soft-tissue]
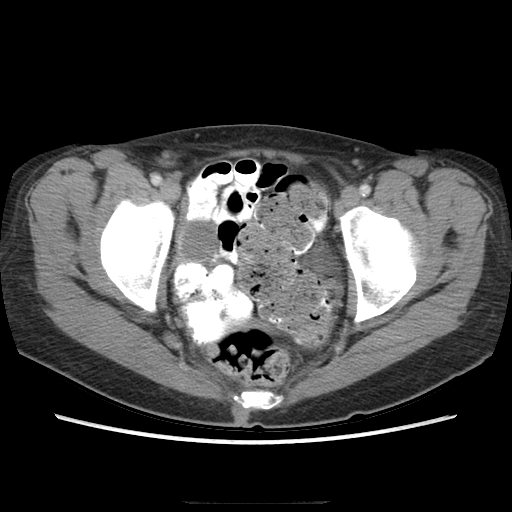
[im 10/83  bone]
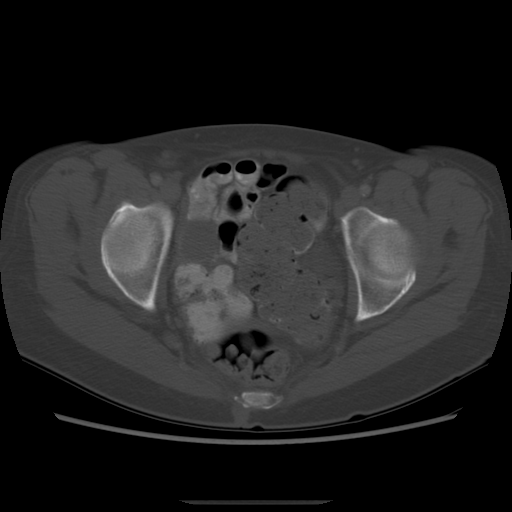
[im 19/83  soft-tissue]
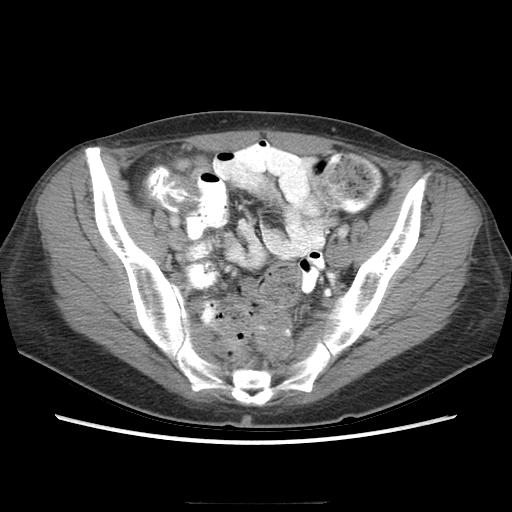
[im 28/83  soft-tissue]
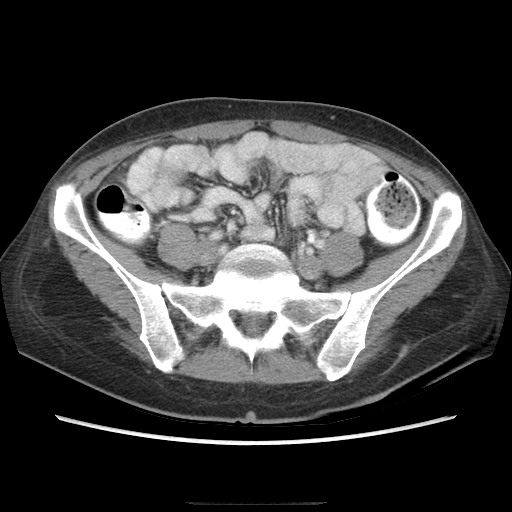
[im 37/83  soft-tissue]
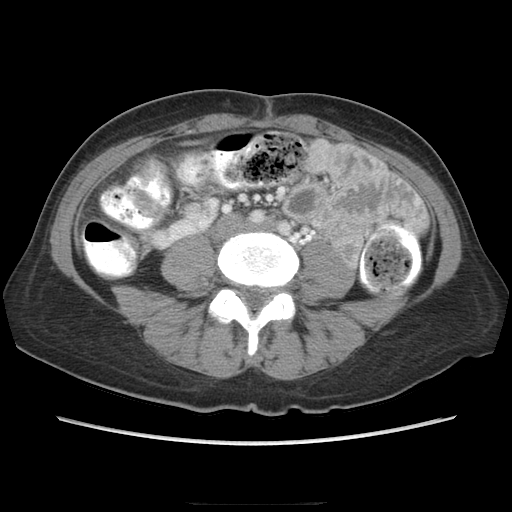
[im 46/83  soft-tissue]
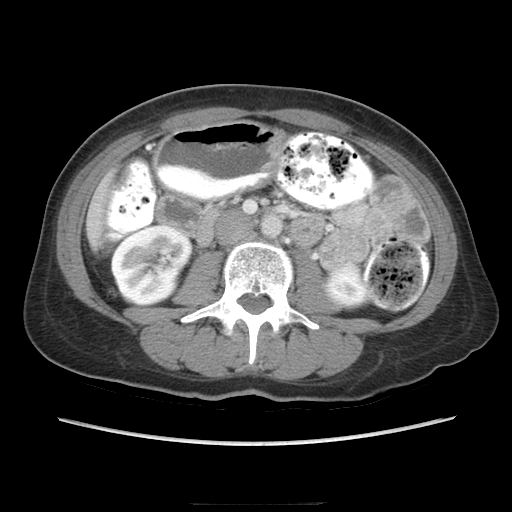
[im 46/83  lung]
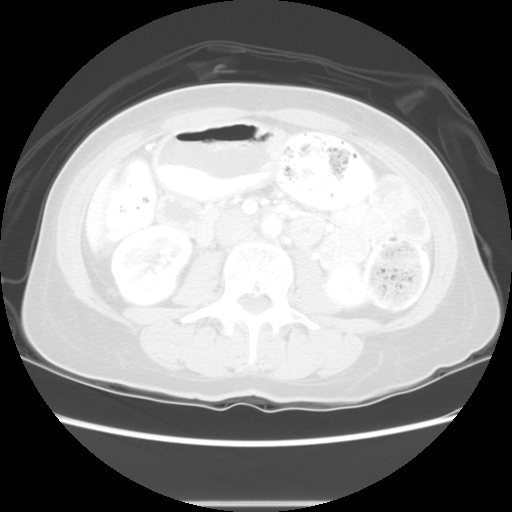
[im 55/83  soft-tissue]
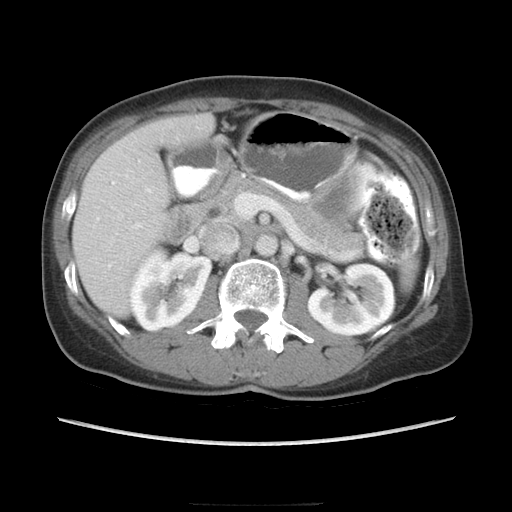
[im 55/83  lung]
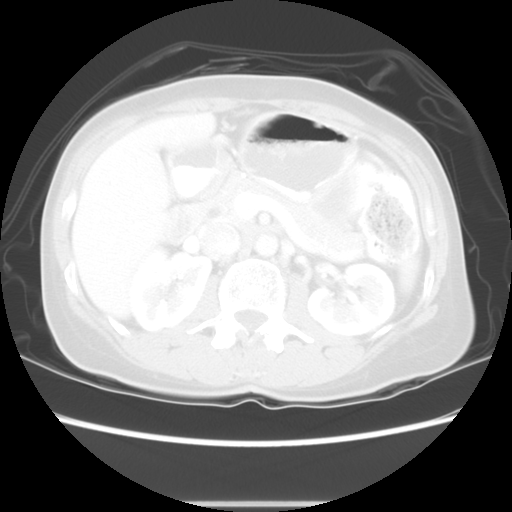
[im 64/83  soft-tissue]
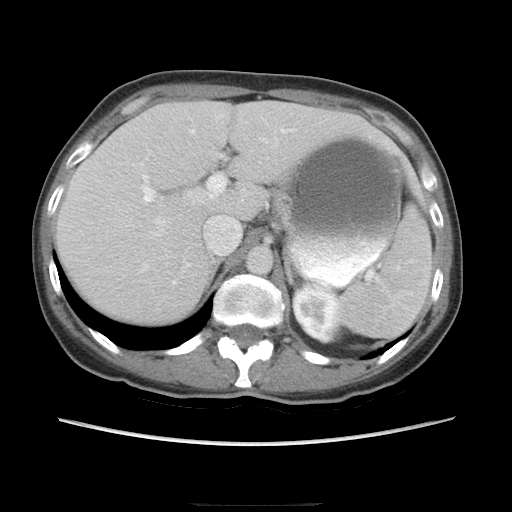
[im 64/83  lung]
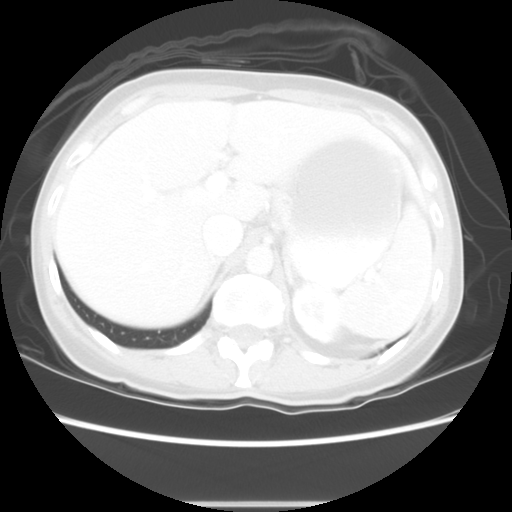
[im 73/83  soft-tissue]
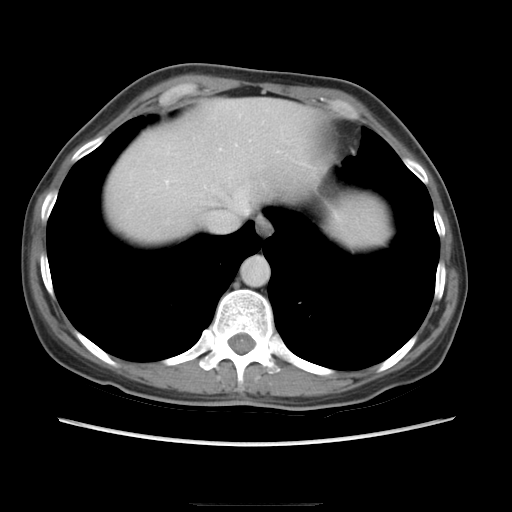
[im 73/83  lung]
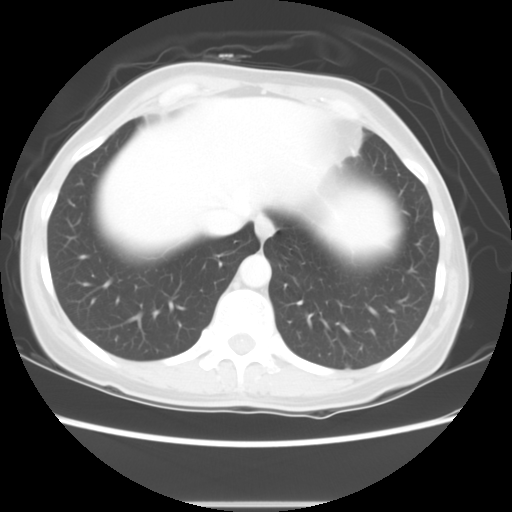

[Series 601: coronal body · coronal · 0.89mm/px · 1 of 101 slices shown, 2 images]
[im 34/101  soft-tissue]
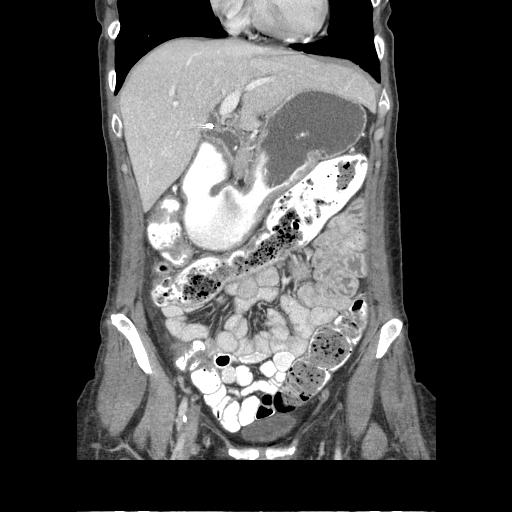
[im 34/101  bone]
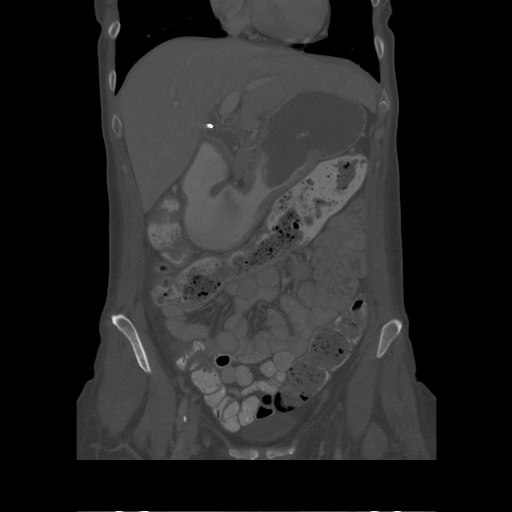

[Series 602: sagittal body · sagittal · 0.89mm/px · 4 of 136 slices shown]
[im 10/136  soft-tissue]
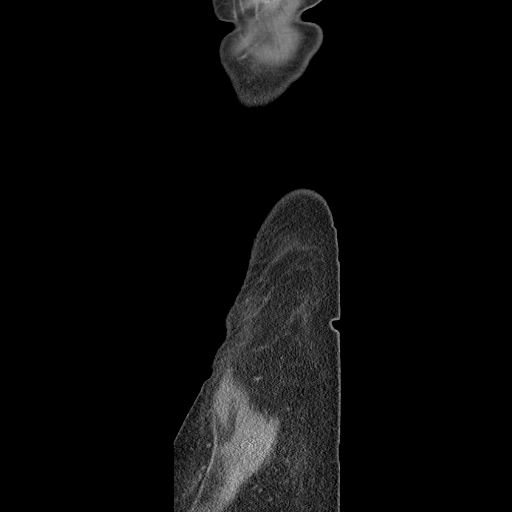
[im 28/136  soft-tissue]
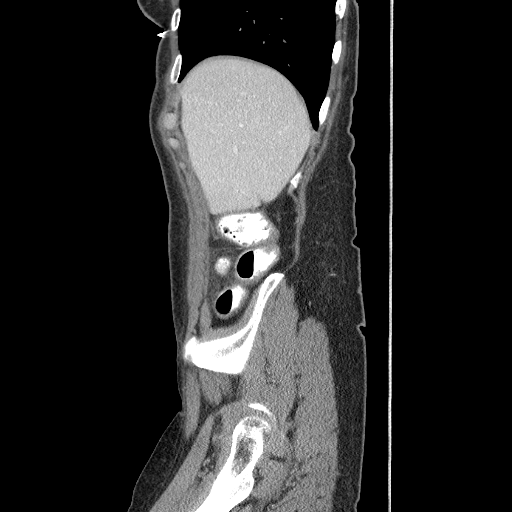
[im 46/136  soft-tissue]
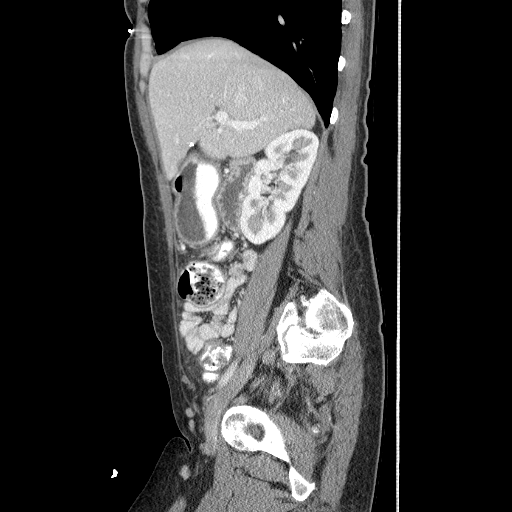
[im 64/136  soft-tissue]
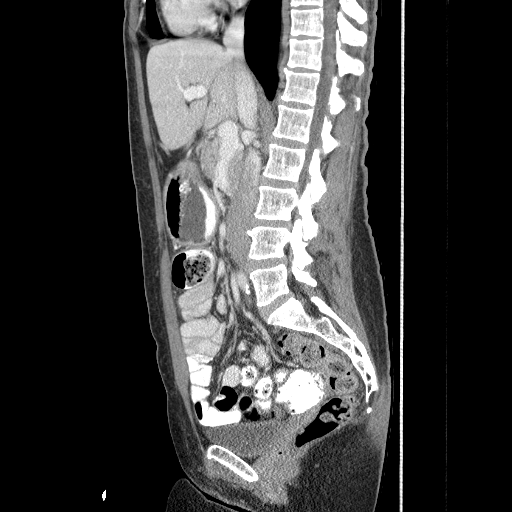

[13 of 36 positions shown; findings below may reference images not displayed]

FINDINGS: The lung bases are clear.  The liver enhances with no
focal abnormality and no ductal dilatation is seen.  Surgical clips
are present from prior cholecystectomy.  Common bile duct is stable
in diameter.  The pancreas is normal in size and the pancreatic
duct is not dilated.  The adrenal glands and spleen are stable.
The stomach is moderately distended and is unremarkable.  The
kidneys enhance with no mass or calculus and no hydronephrosis is
seen.  Abdominal aorta is normal in caliber.  No adenopathy is
noted.  There is a moderate amount of feces throughout the entire
colon.
IMPRESSION: 1.  No acute abnormality on CT the abdomen.
 2.  Moderate amount of feces throughout the colon.

CT PELVIS
FINDINGS: Again there is a moderate amount of feces within the
colon.  The urinary bladder is decompressed and unremarkable.
There is an oval lesion in the right adnexa of 31 x 26 mm with an
attenuation of 50 HU.  A right ovarian lesion cannot be excluded
and pelvic ultrasound may be helpful to assess further.  No free
fluid is seen within the pelvis.  No bony abnormalities seen.
IMPRESSION: 1.  31 x 26 mm right adnexal lesion with attenuation of 50 HU.
Cannot exclude right ovarian lesion and consider ultrasound of the
pelvis to assess further.
2.  Moderate amount of feces throughout the entire colon.

## 2010-06-14 IMAGING — CR DG CHEST 2V
2 series · 2 of 2 positions shown · non-contrast
Comparison: 09/29/2008.

CLINICAL DATA: Smoker and cough.

CHEST - 2 VIEW

[view not recorded (1 of 2)]
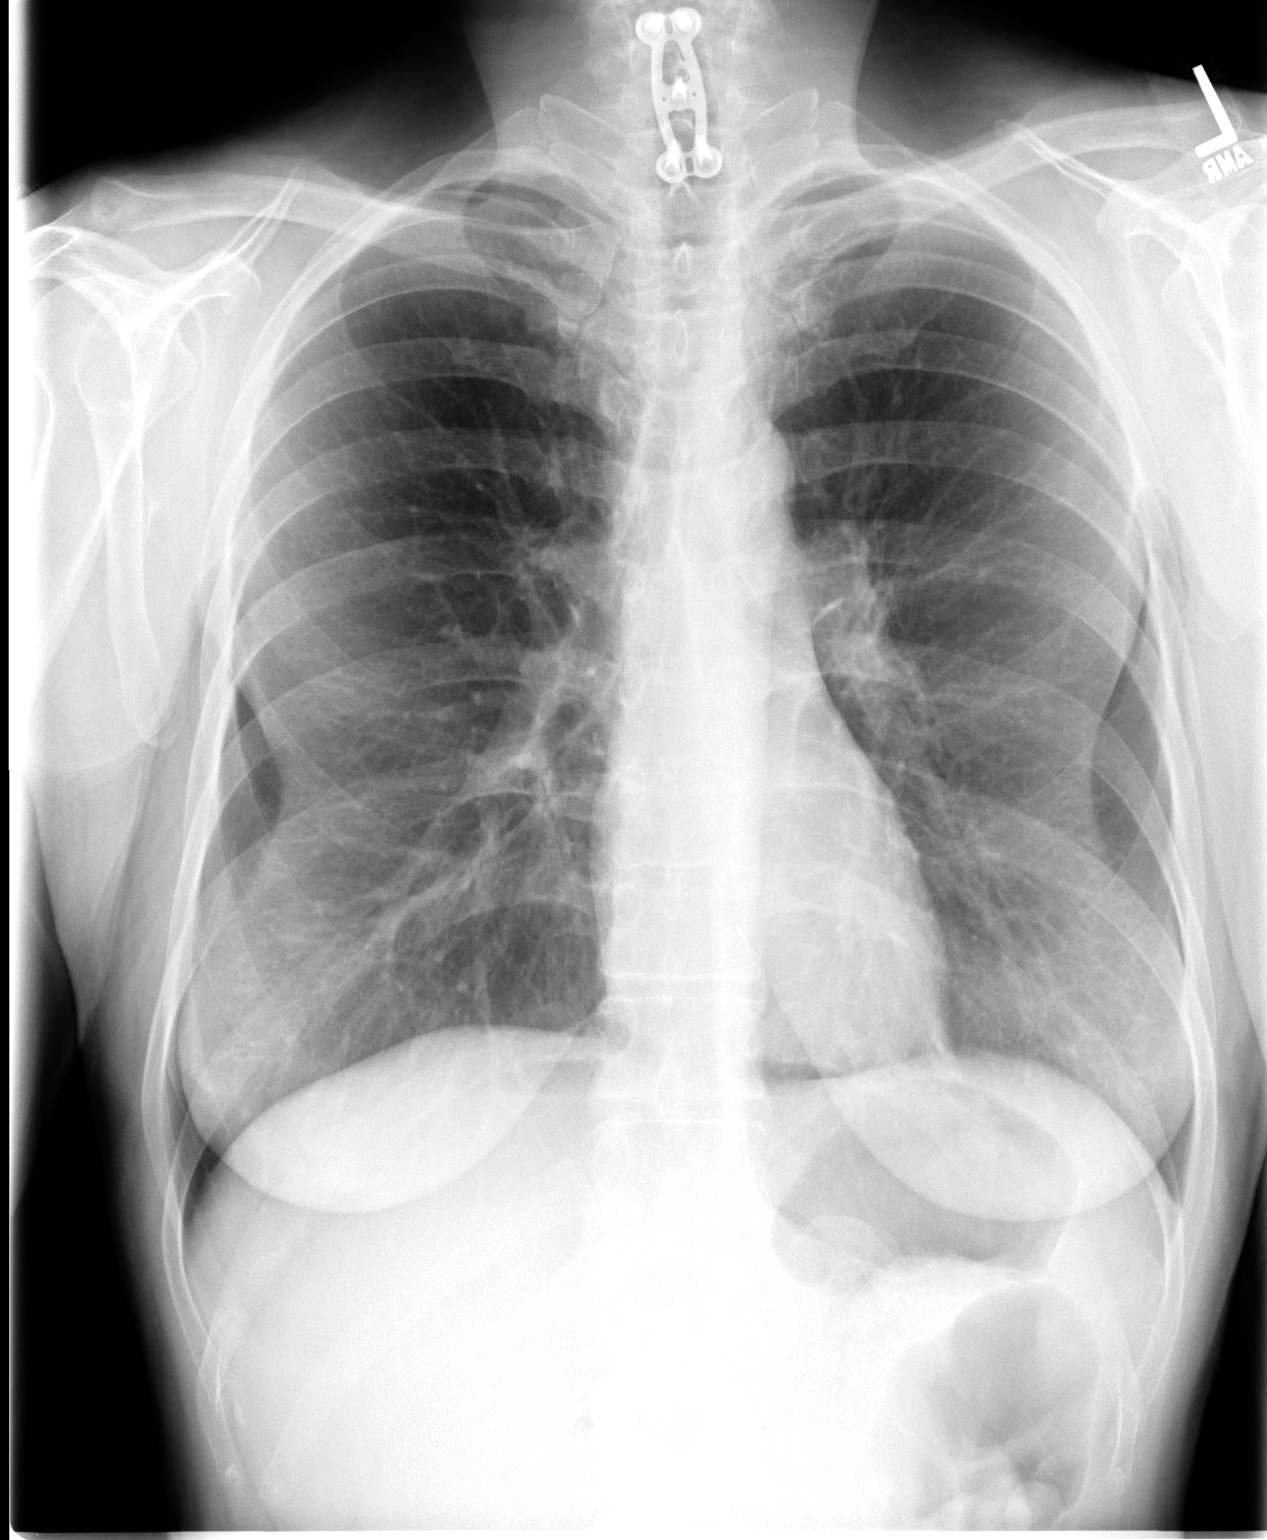

[view not recorded (2 of 2)]
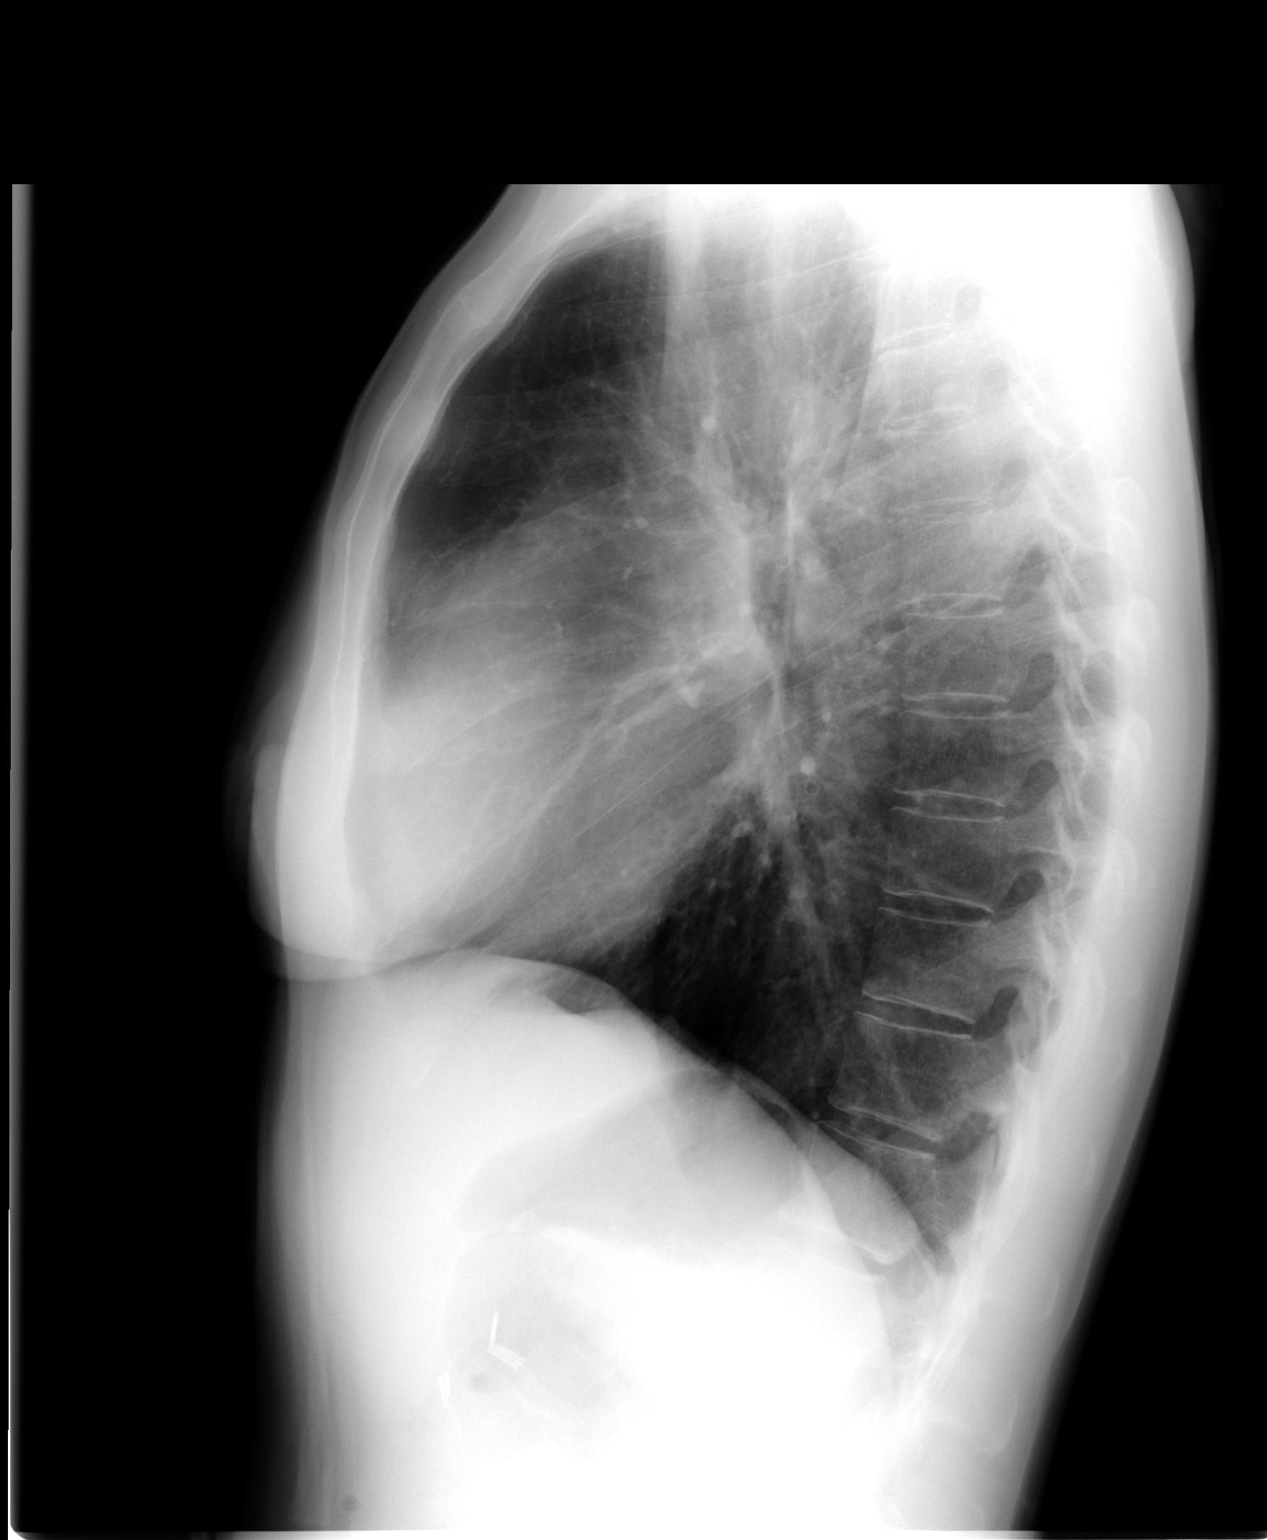

[2 of 2 positions shown; findings below may reference images not displayed]

FINDINGS: Heart size is normal.  There is no heart failure.  The
lungs are clear without infiltrate or effusion.  No interval
change.  There has been prior   anterior cervical fusion.
IMPRESSION: No active cardiopulmonary disease.

## 2010-06-28 ENCOUNTER — Emergency Department (HOSPITAL_COMMUNITY)
Admission: EM | Admit: 2010-06-28 | Discharge: 2010-06-28 | Payer: Self-pay | Source: Home / Self Care | Admitting: Emergency Medicine

## 2010-06-29 ENCOUNTER — Ambulatory Visit (HOSPITAL_COMMUNITY): Admission: RE | Admit: 2010-06-29 | Discharge: 2010-06-29 | Payer: Self-pay | Admitting: Endocrinology
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
## 2010-08-03 ENCOUNTER — Emergency Department (HOSPITAL_COMMUNITY): Admission: EM | Admit: 2010-08-03 | Discharge: 2010-08-03 | Payer: Self-pay | Admitting: Emergency Medicine

## 2010-09-26 ENCOUNTER — Encounter: Payer: Self-pay | Admitting: Internal Medicine

## 2010-09-27 ENCOUNTER — Encounter: Payer: Self-pay | Admitting: *Deleted

## 2010-09-27 ENCOUNTER — Encounter: Payer: Self-pay | Admitting: Emergency Medicine

## 2010-10-01 IMAGING — CT CT ABD-PELV W/ CM
2 of 5 series · 16 of 46 positions shown, 18 images · IV contrast (Omnipaque 300)
Comparison: 08/20/2009

CLINICAL DATA: Nausea, vomiting and diarrhea.  Abdominal pain.

CT ABDOMEN AND PELVIS WITH CONTRAST
TECHNIQUE: Multidetector CT imaging of the abdomen and pelvis was
performed following the standard protocol during bolus
administration of intravenous contrast.
Contrast: 100 ml Cmnipaque-ENN

[Series 3: abd_pel_with 5.0 b40f · axial · 0.63mm/px · z∈[-656,-256]mm · 13 of 92 slices shown, 15 images]
[im 6/92  soft-tissue]
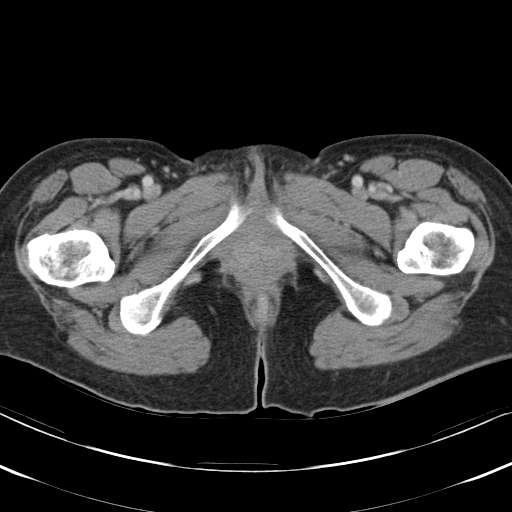
[im 6/92  bone]
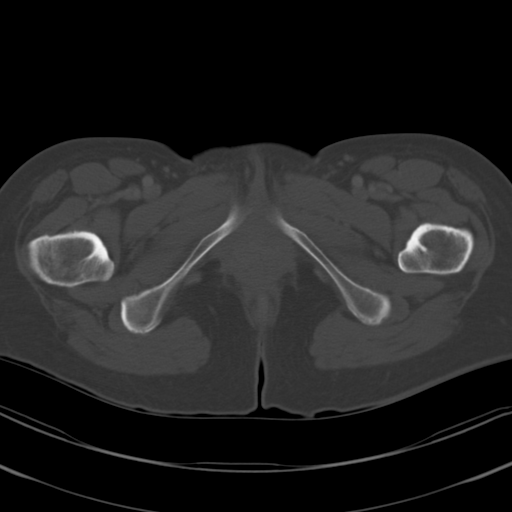
[im 11/92  soft-tissue]
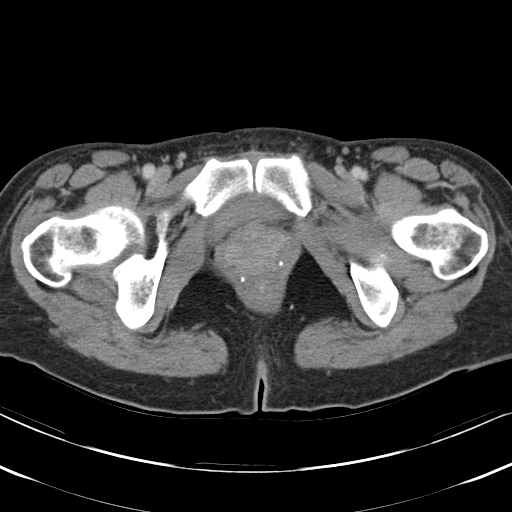
[im 21/92  soft-tissue]
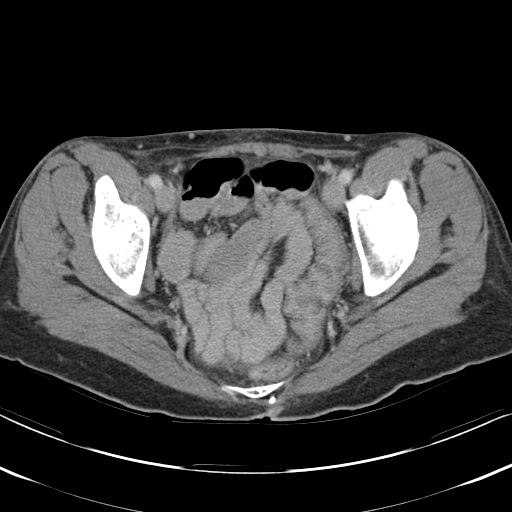
[im 26/92  soft-tissue]
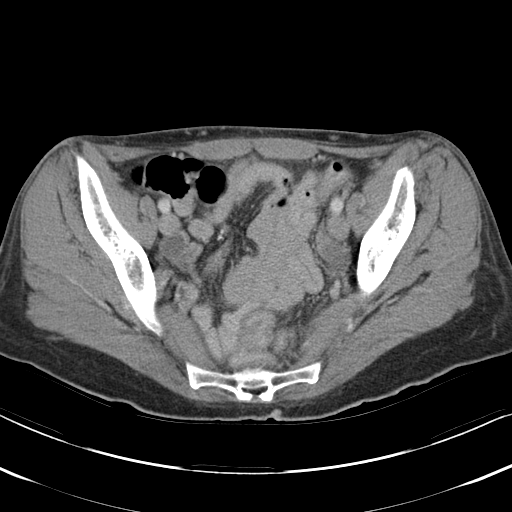
[im 31/92  soft-tissue]
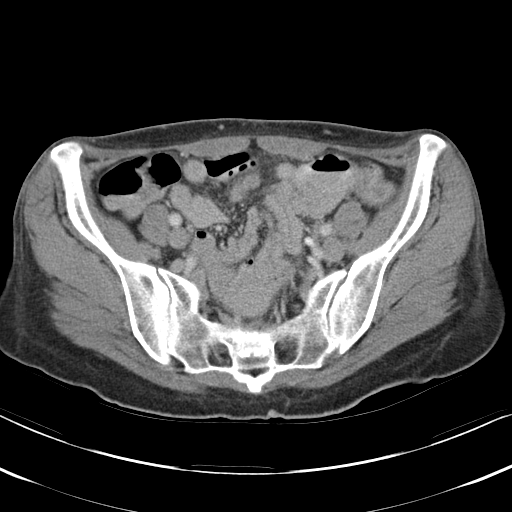
[im 41/92  soft-tissue]
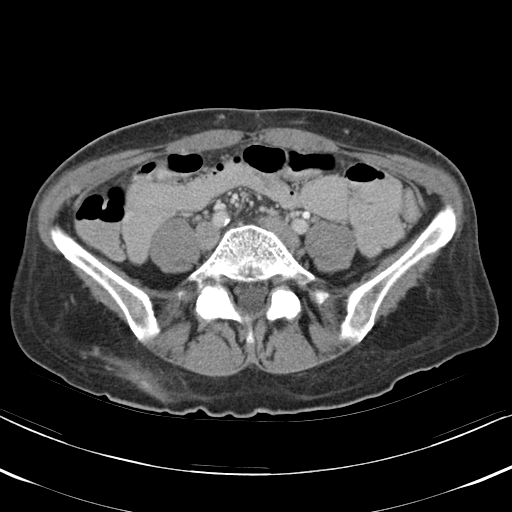
[im 46/92  soft-tissue]
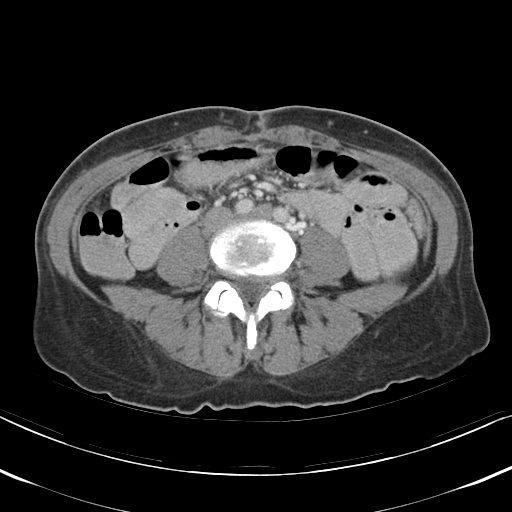
[im 51/92  soft-tissue]
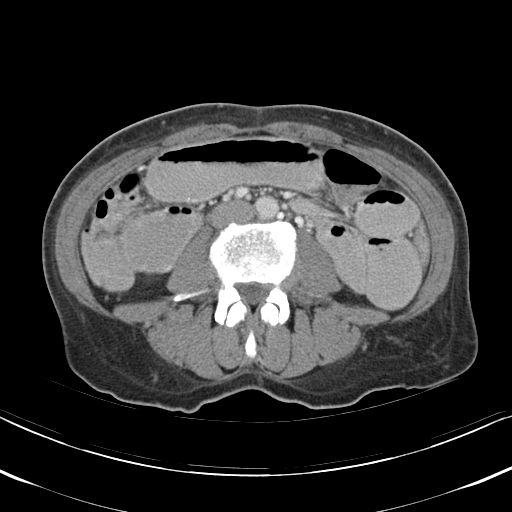
[im 61/92  soft-tissue]
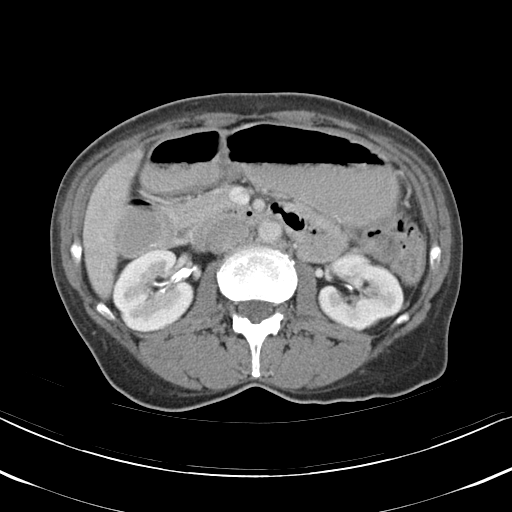
[im 61/92  bone]
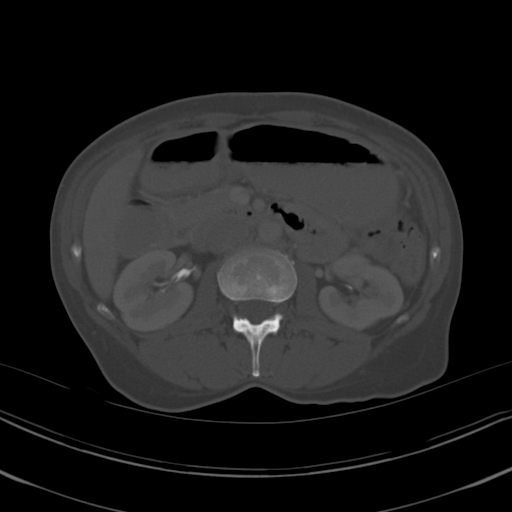
[im 66/92  soft-tissue]
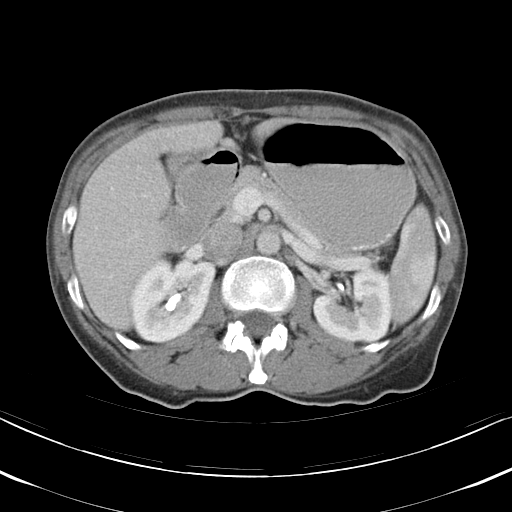
[im 71/92  soft-tissue]
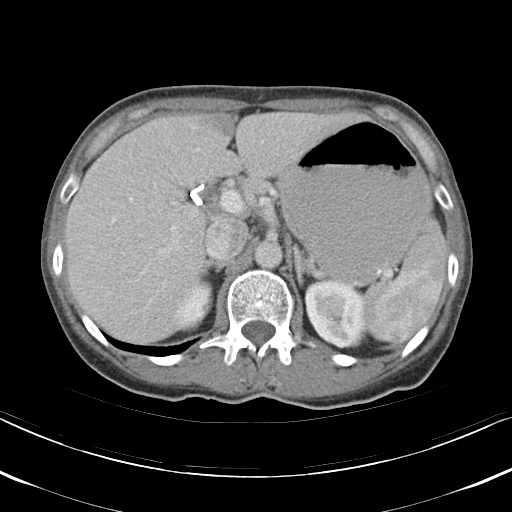
[im 81/92  soft-tissue]
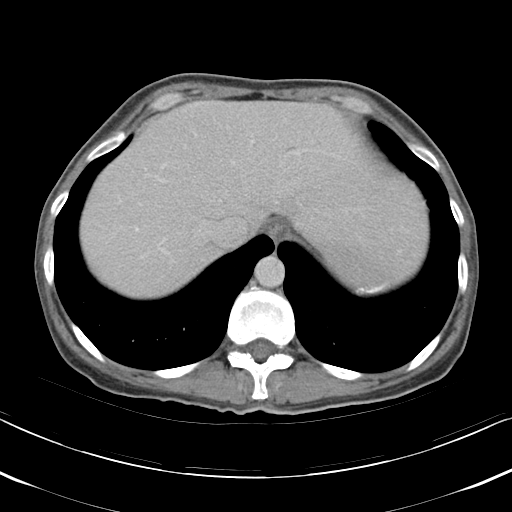
[im 86/92  soft-tissue]
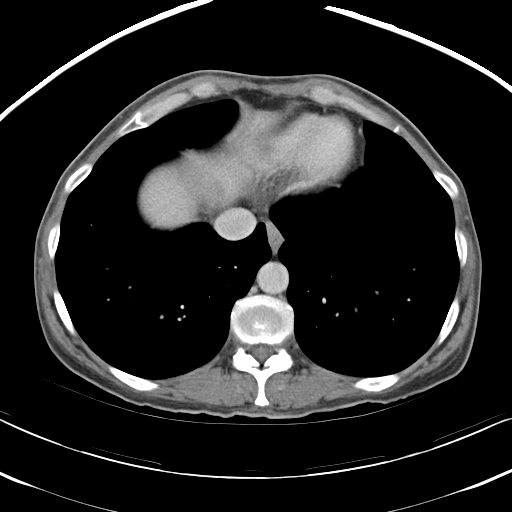

[Series 5: mpr cor post contrast (id) · coronal · 0.63mm/px · 3 of 65 slices shown]
[im 22/65  soft-tissue]
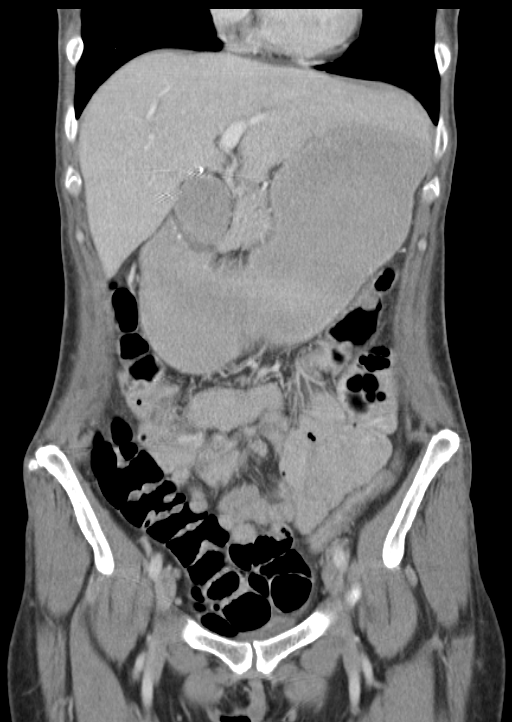
[im 29/65  soft-tissue]
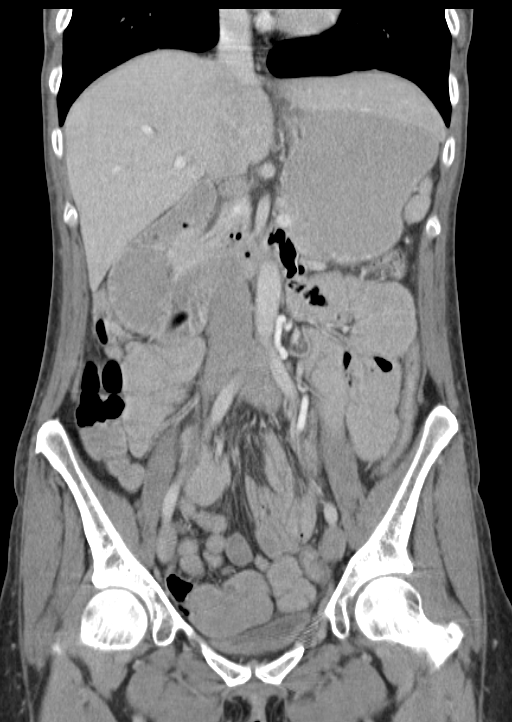
[im 36/65  soft-tissue]
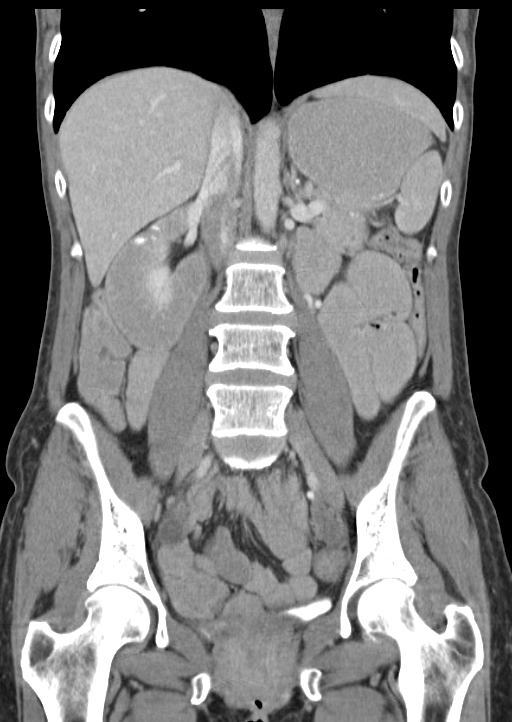

[16 of 46 positions shown; findings below may reference images not displayed]

FINDINGS: Low density along the falciform ligament of the liver
consistent with focal fat.  Subtle low density along the left
hepatic dome on image 8 is nonspecific.  This vague low density
area was present on the previous examination.  The gallbladder has
been removed. The portal venous venous system is patent.  Few
linear densities at portal confluence could represent mixing blood.
No evidence for a large thrombus.  The splenic parenchyma is very
heterogeneous and likely related to the phase of contrast.  The
stomach and proximal small bowel are distended with fluid.  Fluid
filled small bowel loops throughout the abdomen.  Normal appearance
of the adrenal tissue, kidneys and pancreas.

Small low density structures in the adnexa are suggestive for
follicles.  Uterus has been removed.  Stable sclerosis involving
the left superior pubic ramus.  No acute bony abnormality.
IMPRESSION: Fluid filled stomach and bowel loops.  No focal inflammatory
changes.

Status post cholecystectomy and hysterectomy.

Stable low density area along hepatic dome is nonspecific.

## 2010-10-01 IMAGING — CR DG ABDOMEN ACUTE W/ 1V CHEST
3 series · 3 of 3 positions shown · non-contrast
Comparison: None.

CLINICAL DATA: Abdominal pain.  Nausea vomiting diarrhea.

ACUTE ABDOMEN SERIES (ABDOMEN 2 VIEW & CHEST 1 VIEW)

[view not recorded (1 of 3)]
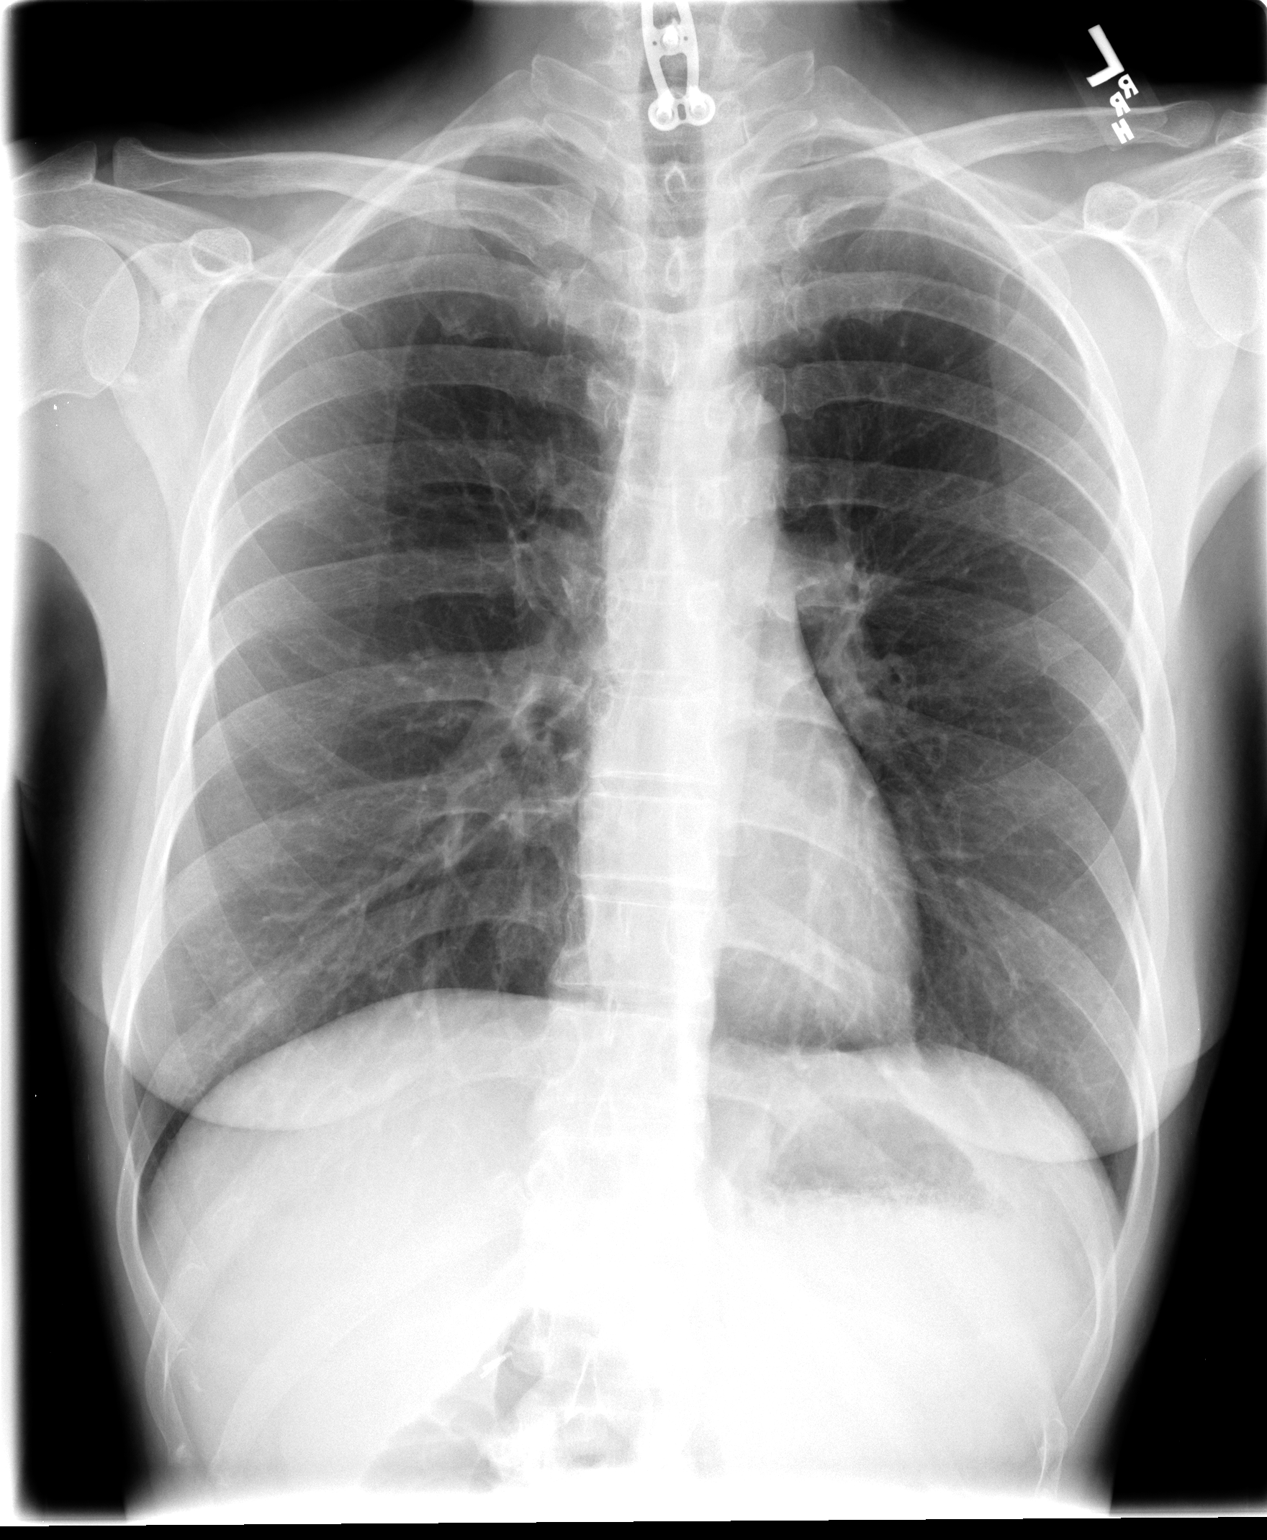

[view not recorded (2 of 3)]
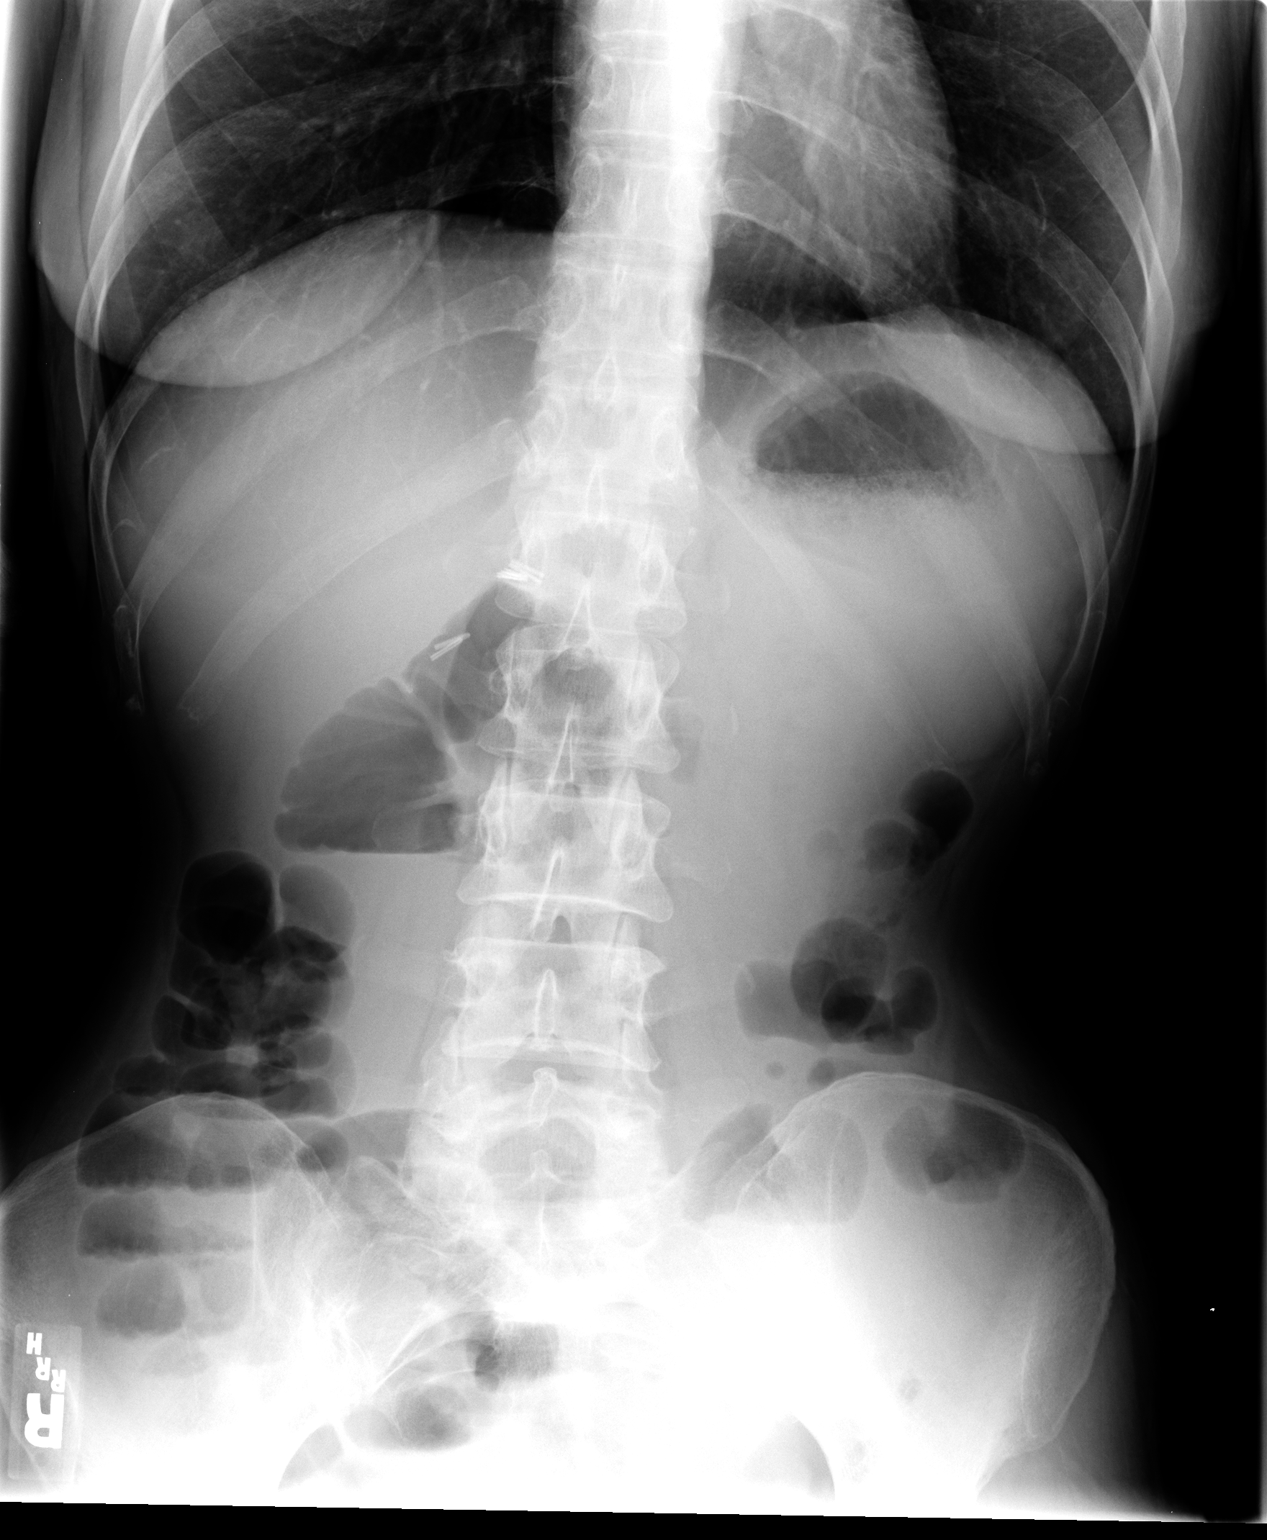

[view not recorded (3 of 3)]
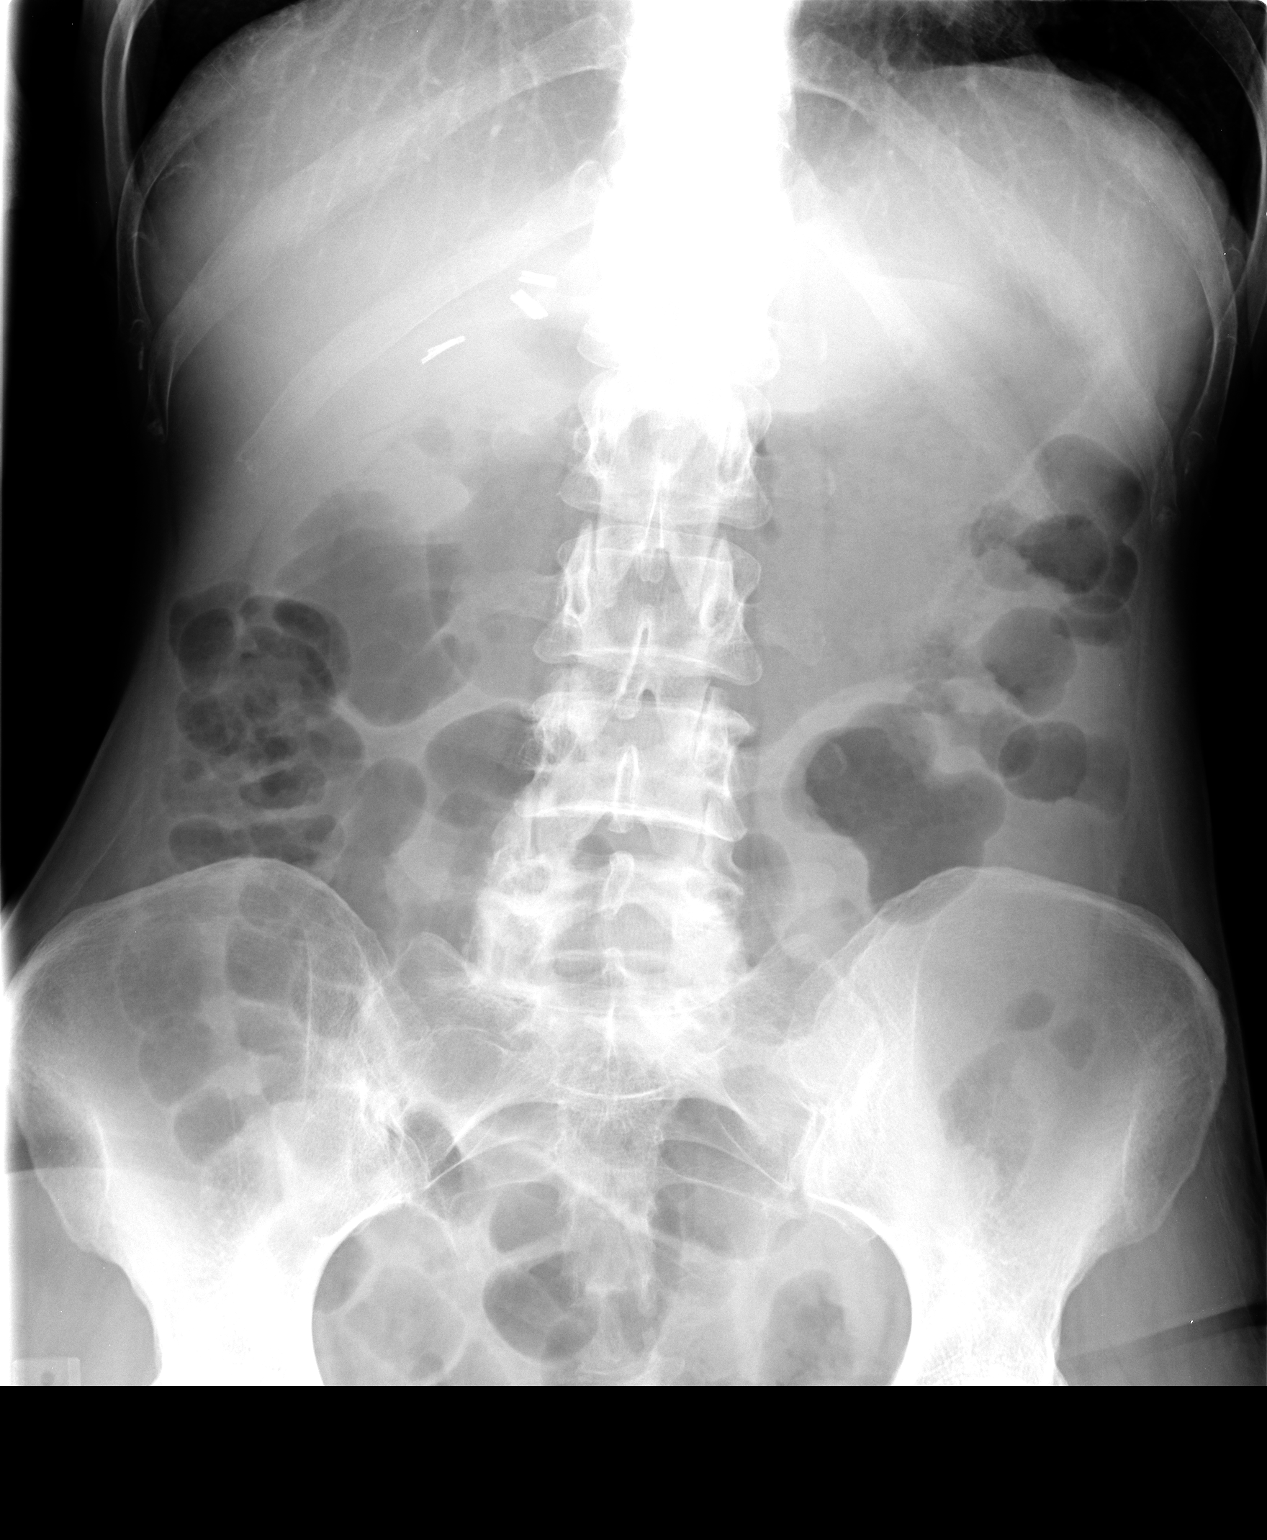

[3 of 3 positions shown; findings below may reference images not displayed]

FINDINGS: No active cardiopulmonary disease.  Normal heart size
shape.  Distended stomach and proximal duodenum.  Duodenal air-
fluid level.  No free air.  Cholecystectomy clips.
IMPRESSION: Distended stomach and duodenum.  Cannot exclude obstruction.
Findings may be due to a focal ileus.

## 2010-10-08 NOTE — Letter (Signed)
Summary: [facility] ENT Office Visit Note   [facility] ENT Office Visit Note   Imported By: Roderic Ovens 03/04/2010 10:10:51  _____________________________________________________________________  External Attachment:    Type:   Image     Comment:   External Document

## 2010-10-08 NOTE — Progress Notes (Signed)
Summary: pt having SOB   Phone Note Call from Patient Call back at Work Phone [phone]   Caller: Patient Reason for Call: Talk to Nurse, Talk to Doctor Summary of Call: pt is having SOB pt has been having cramps at all parts of her body she wants to talk to someone cause she is not sure if it has to do with her heart or not Initial call taken by: Omer Jack,  October 03, 2009 9:03 AM  Follow-up for Phone Call        called pcp yest, is having cramps all over body, was told to f/u w/us to have potassium checked, last pm she dev. fever, Pt also is very SOB and productive cough w/clear mucous since last pm, feels chest is congested.  She is concerned that the plate in her mouth could be infected, explained pt needs to call pcp and be seen today to f/u on all symptoms, she is agreeable Meredith Staggers, RN  October 03, 2009 9:42 AM
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
## 2010-10-21 ENCOUNTER — Telehealth (INDEPENDENT_AMBULATORY_CARE_PROVIDER_SITE_OTHER): Payer: Self-pay | Admitting: *Deleted

## 2010-10-24 IMAGING — CT CT HEAD W/O CM
1 series · 16 of 30 positions shown, 20 images · non-contrast
Comparison: None

CLINICAL DATA: Post concussion headache.

CT HEAD WITHOUT CONTRAST
TECHNIQUE: Contiguous axial images were obtained from the base of
the skull through the vertex without contrast.

[Series 2: headtrauma 4.8 h37s · axial · 0.45mm/px · z∈[+84,+252]mm · 16 of 36 slices shown, 20 images]
[im 2/36  brain]
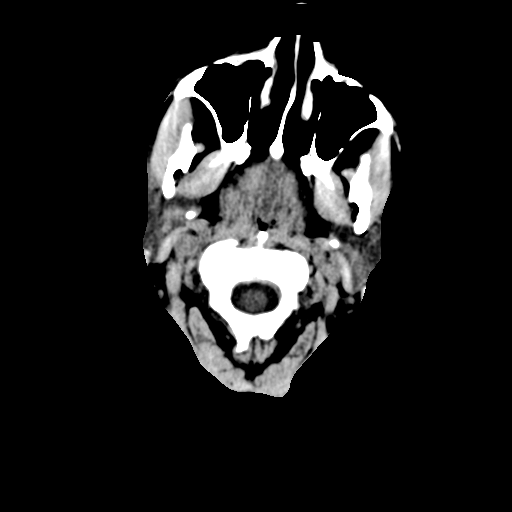
[im 2/36  bone]
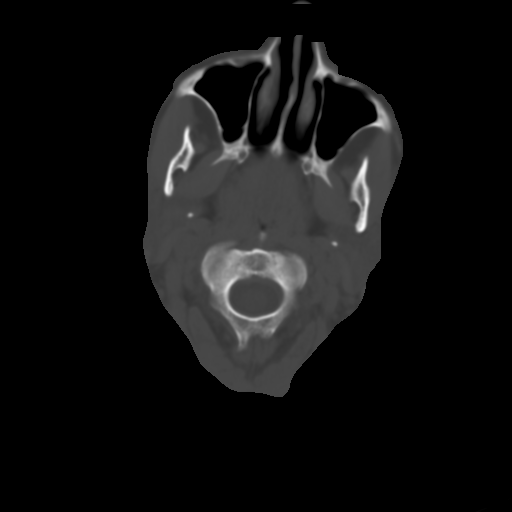
[im 4/36  brain]
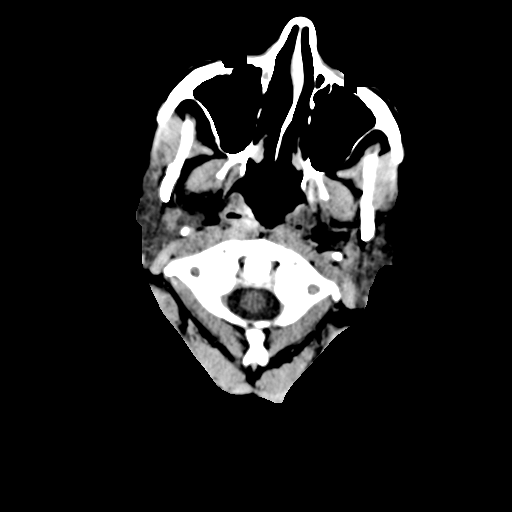
[im 7/36  brain]
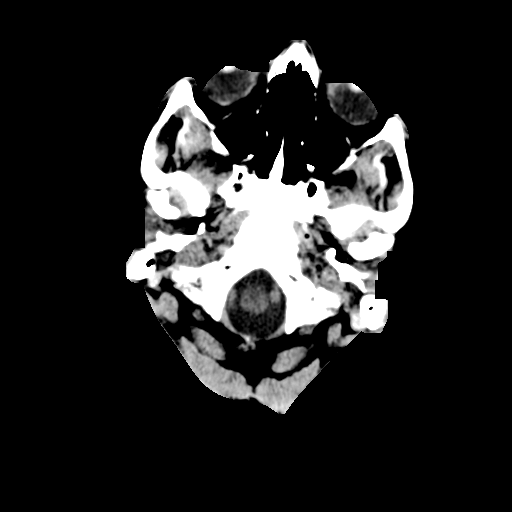
[im 9/36  brain]
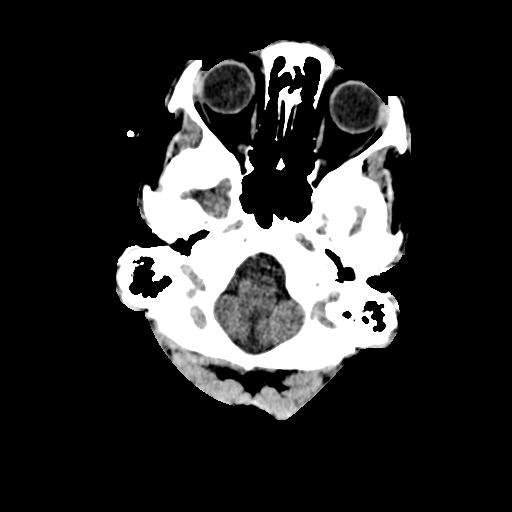
[im 10/36  brain]
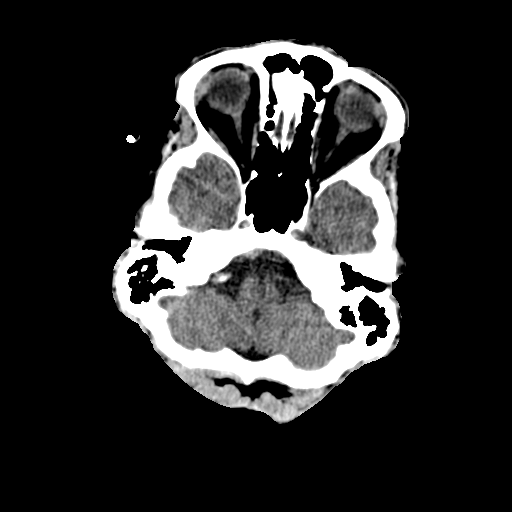
[im 10/36  bone]
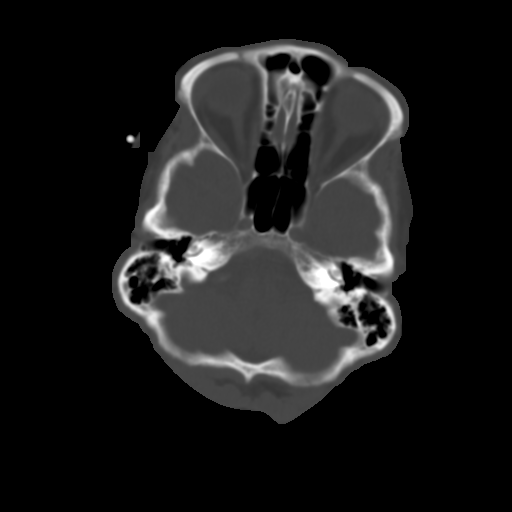
[im 13/36  brain]
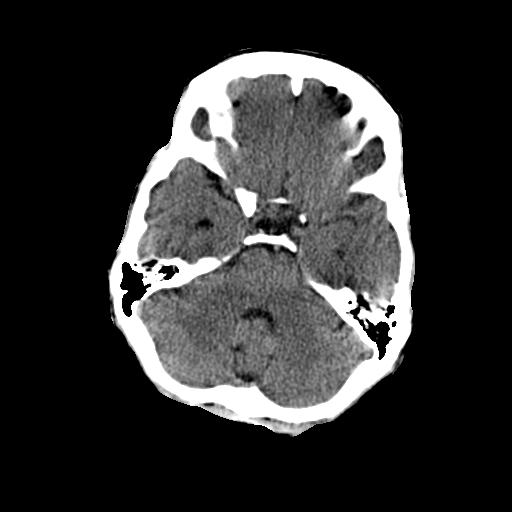
[im 15/36  brain]
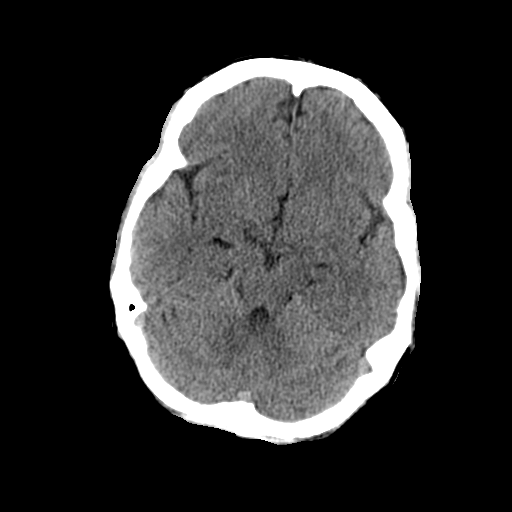
[im 17/36  brain]
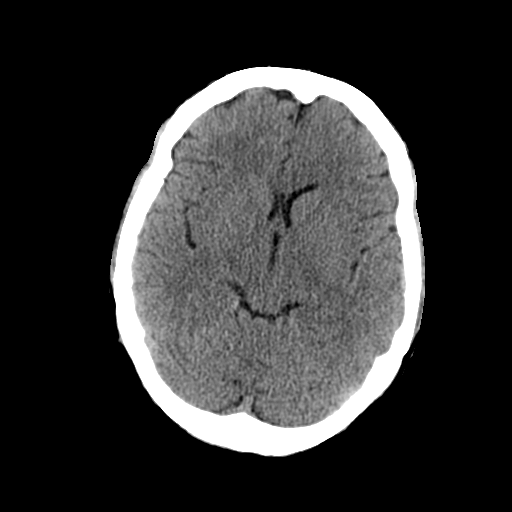
[im 19/36  brain]
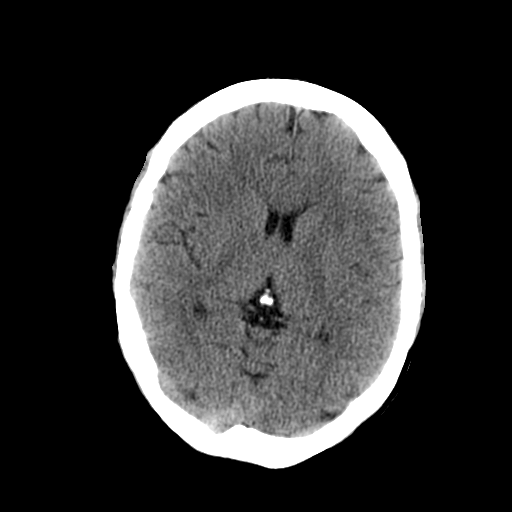
[im 19/36  bone]
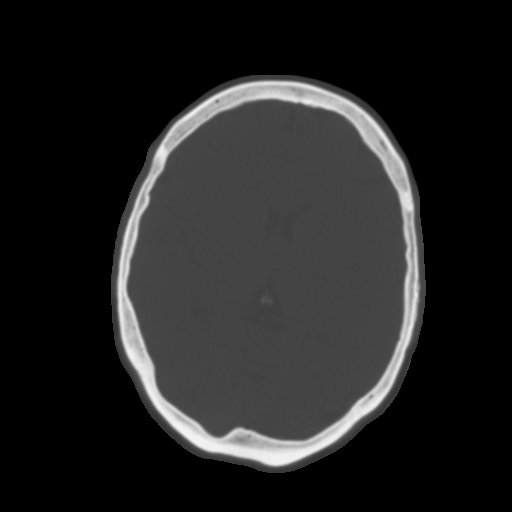
[im 21/36  brain]
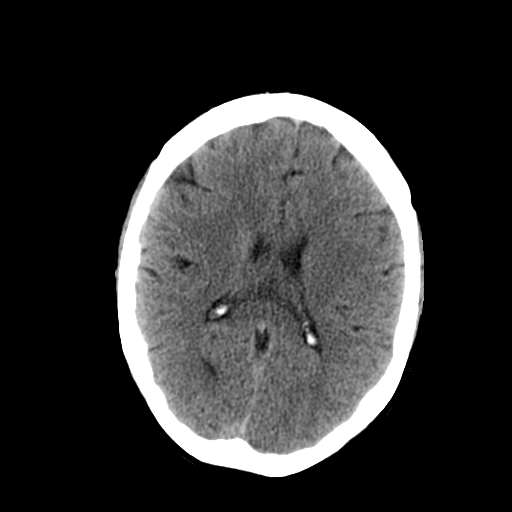
[im 23/36  brain]
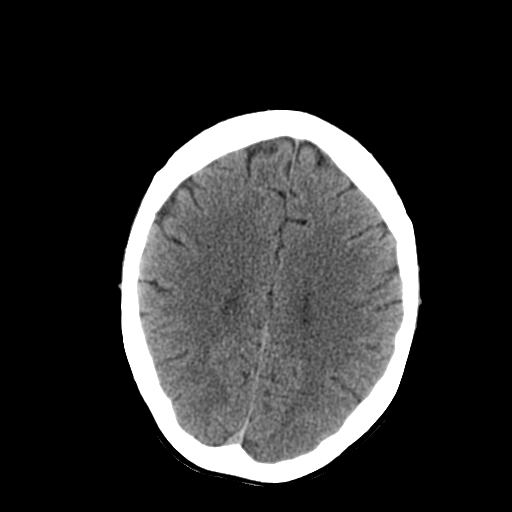
[im 26/36  brain]
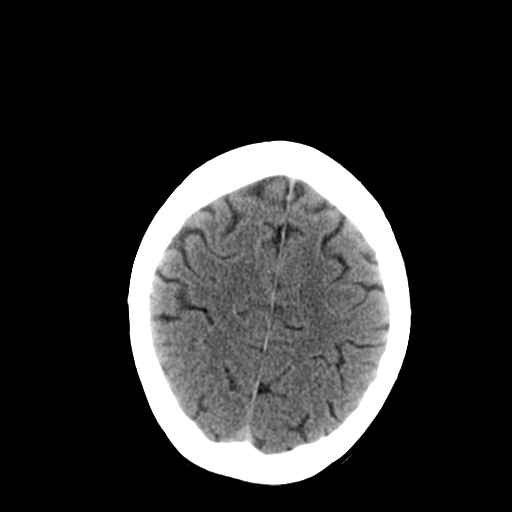
[im 27/36  brain]
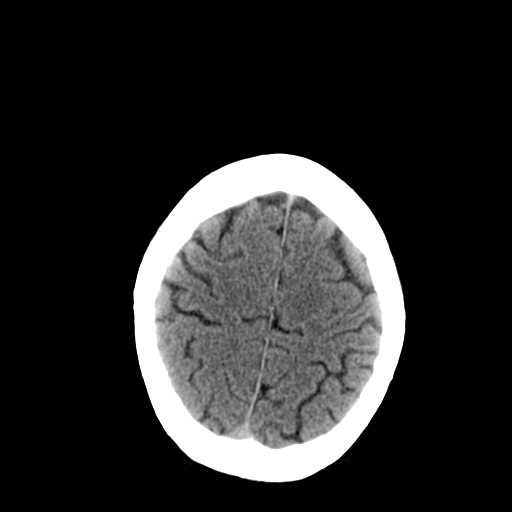
[im 27/36  bone]
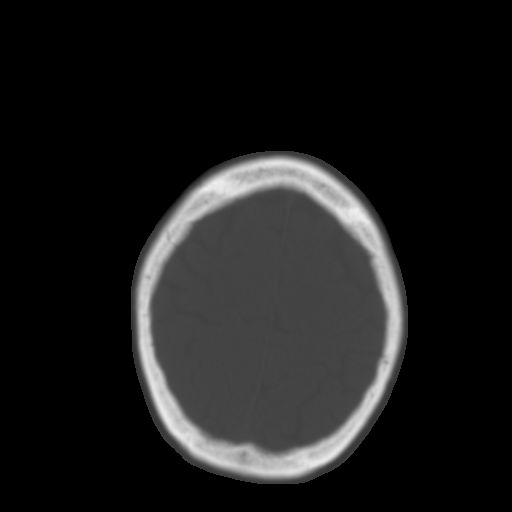
[im 29/36  brain]
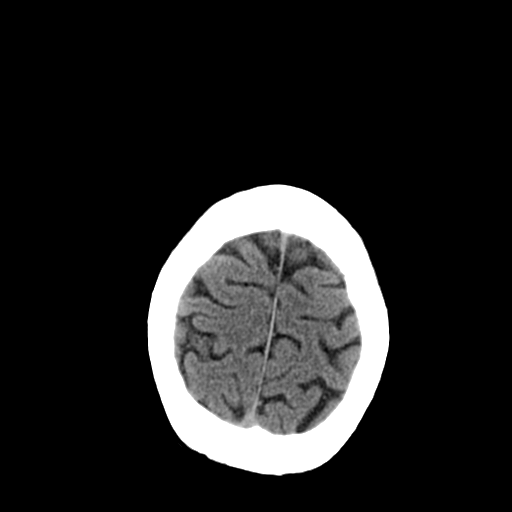
[im 32/36  brain]
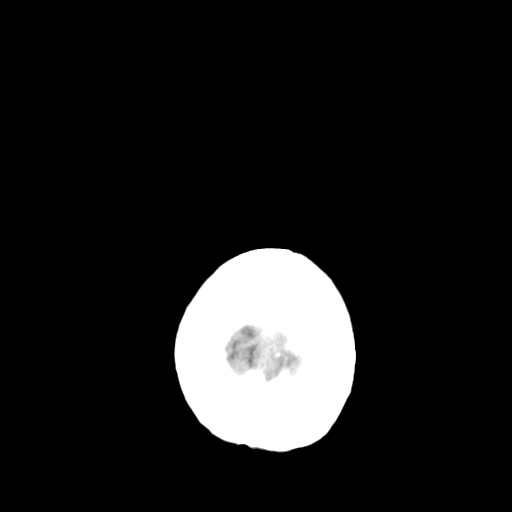
[im 34/36  brain]
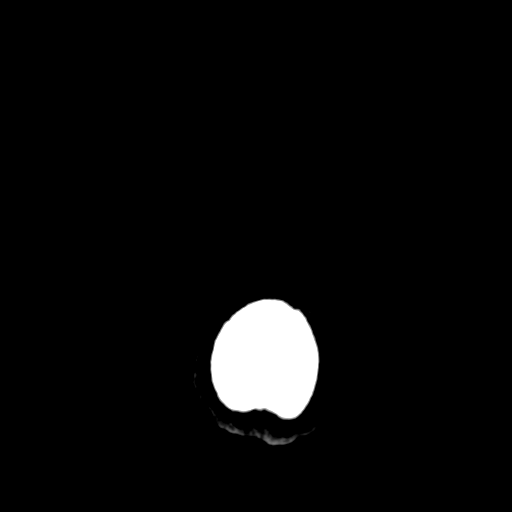

[16 of 30 positions shown; findings below may reference images not displayed]

FINDINGS: The ventricles are normal.  No extra-axial fluid
collections are seen.  The brainstem and cerebellum are
unremarkable.  No acute intracranial findings such as infarction or
hemorrhage.  No mass lesions.

The bony calvarium is intact.  The visualized paranasal sinuses and
mastoid air cells are clear.
IMPRESSION: No acute intracranial findings or skull fracture.

## 2010-10-24 IMAGING — CR DG CERVICAL SPINE COMPLETE 4+V
5 series · 5 of 5 positions shown · non-contrast
Comparison: None

CLINICAL DATA: Neck pain.

CERVICAL SPINE - COMPLETE 4+ VIEW

[view not recorded (1 of 5)]
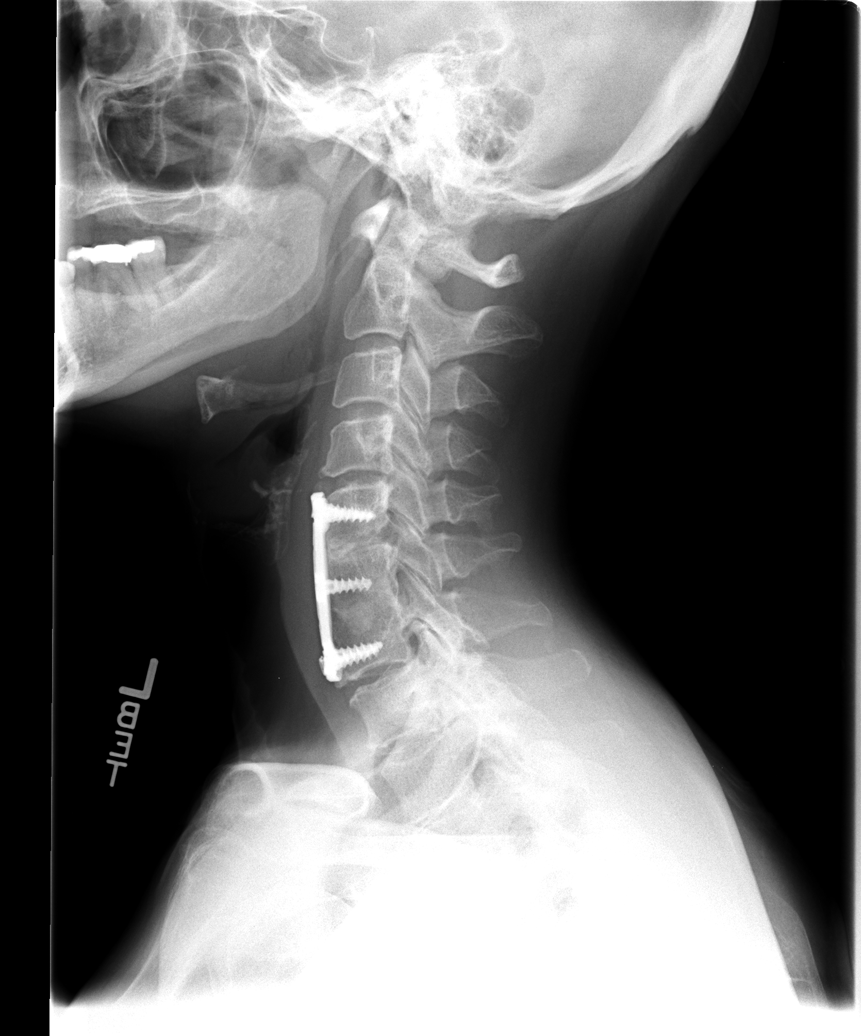

[view not recorded (2 of 5)]
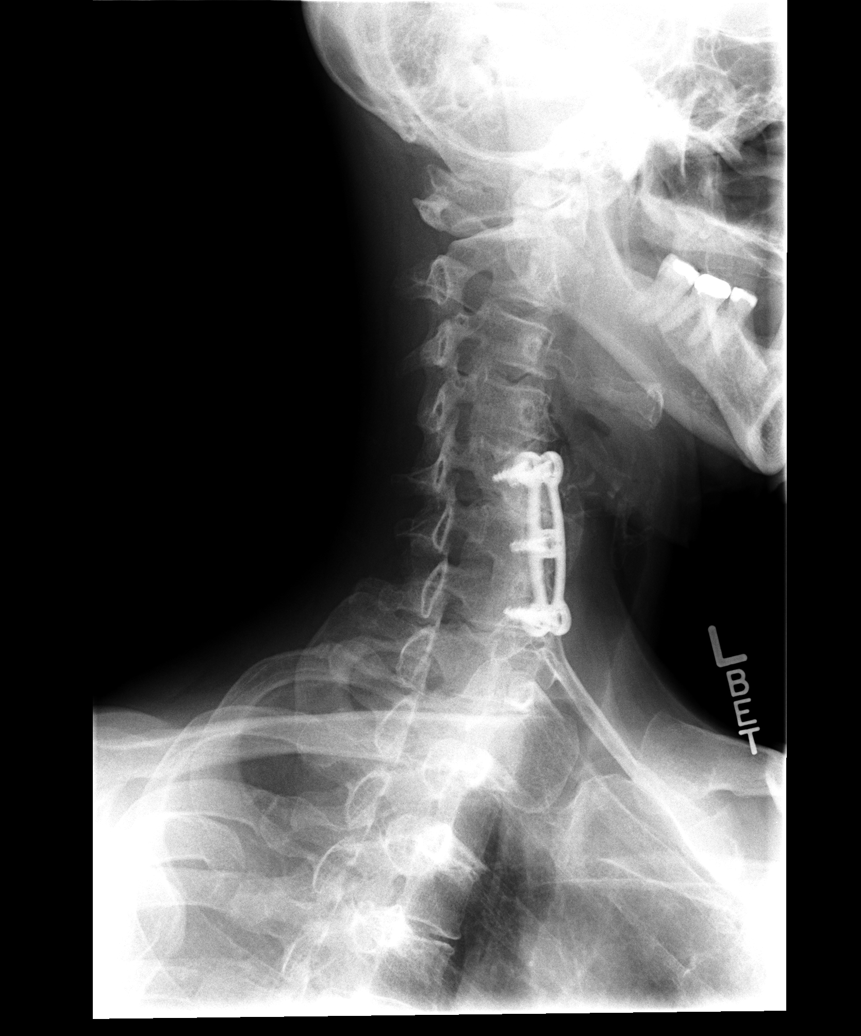

[view not recorded (3 of 5)]
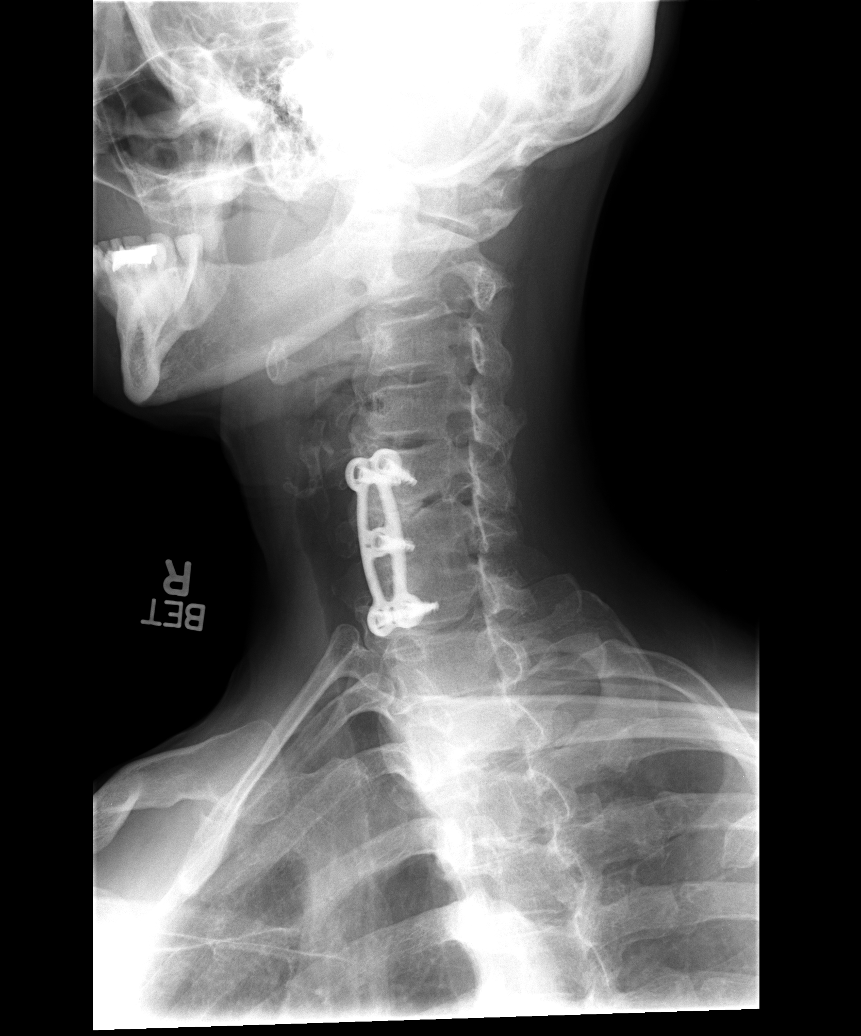

[view not recorded (4 of 5)]
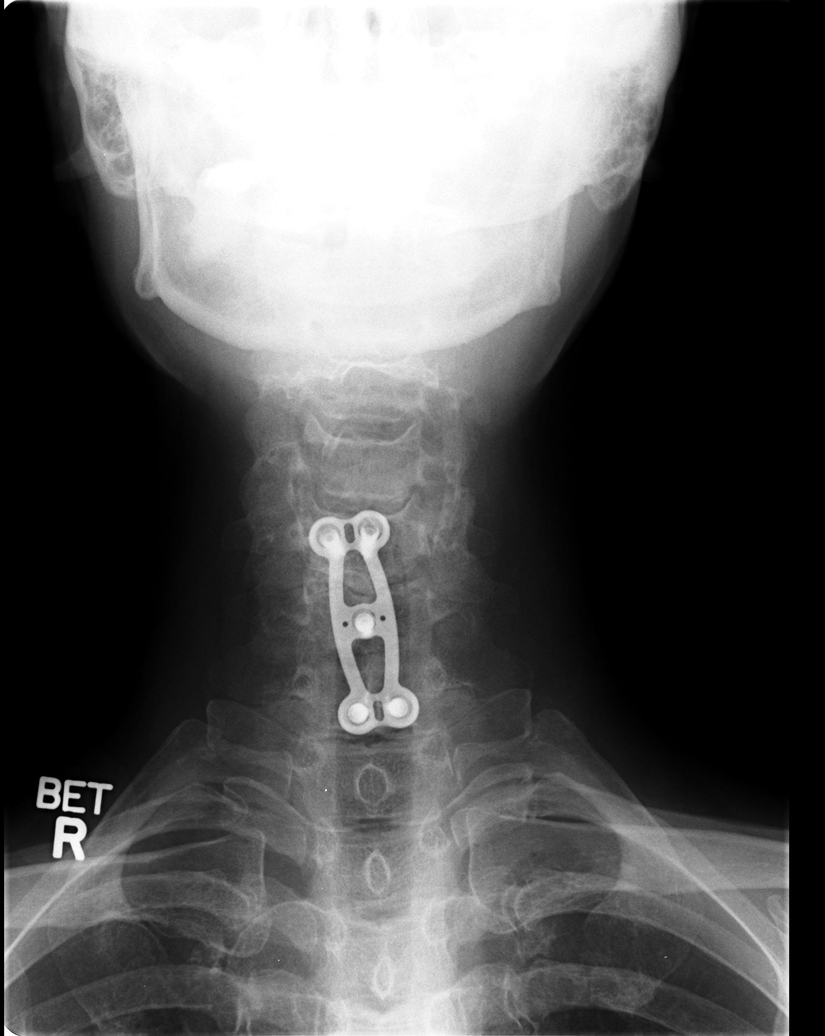

[view not recorded (5 of 5)]
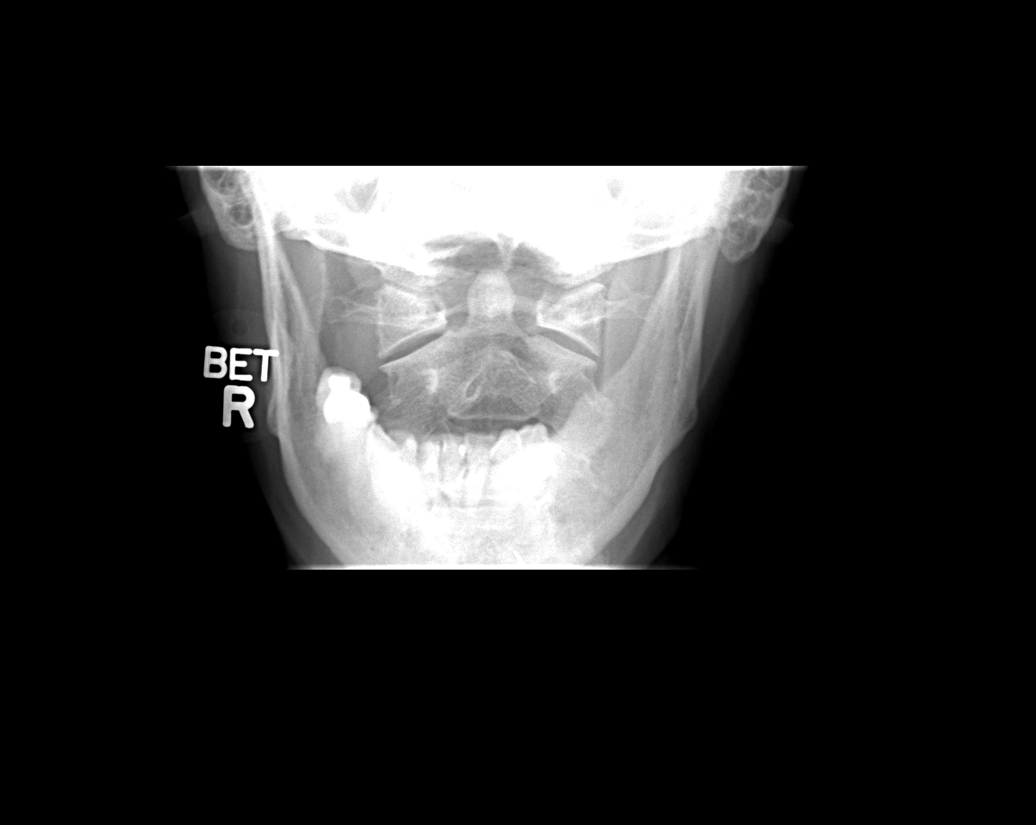

[5 of 5 positions shown; findings below may reference images not displayed]

FINDINGS: The lateral film demonstrates normal alignment of the
cervical vertebral bodies.  Anterior plate and screw and interbody
fusion changes noted at C5-6 and C6-7.  No complicating features
are demonstrated.  No acute bony findings.  Foraminal narrowing
bilaterally noted at C7-T1 due to uncinate spurring.  The C1-2
articulations are maintained.  The lung apices are clear.
IMPRESSION: 1.  Anterior and interbody fusion changes at C5-6 and C6-7.
2.  No acute bony findings.
3.  Uncinate spurring changes at C7-T1.

## 2010-10-28 NOTE — Progress Notes (Signed)
  Phone Note Other Incoming   Request: Send information Summary of Call: Request for records received from [facility]. Request forwarded to [facility].  04/2009

## 2010-11-17 LAB — LIPASE, BLOOD: Lipase: 35 U/L (ref 11–59)

## 2010-11-17 LAB — DIFFERENTIAL
Basophils Absolute: 0.1 10*3/uL (ref 0.0–0.1)
Eosinophils Absolute: 0.1 10*3/uL (ref 0.0–0.7)
Monocytes Absolute: 0.6 10*3/uL (ref 0.1–1.0)

## 2010-11-17 LAB — CBC
Hemoglobin: 15.2 g/dL — ABNORMAL HIGH (ref 12.0–15.0)
MCHC: 35.4 g/dL (ref 30.0–36.0)
Platelets: 381 10*3/uL (ref 150–400)
WBC: 9.8 10*3/uL (ref 4.0–10.5)

## 2010-11-17 LAB — COMPREHENSIVE METABOLIC PANEL
AST: 22 U/L (ref 0–37)
Albumin: 4.3 g/dL (ref 3.5–5.2)
Calcium: 9.3 mg/dL (ref 8.4–10.5)
Creatinine, Ser: 0.77 mg/dL (ref 0.4–1.2)
GFR calc Af Amer: >60 mL/min (ref >60)
Glucose, Bld: 96 mg/dL (ref 70–99)
Sodium: 138 mEq/L (ref 135–145)
Total Protein: 7.8 g/dL (ref 6.0–8.3)

## 2010-11-17 LAB — URINALYSIS, ROUTINE W REFLEX MICROSCOPIC: Protein, ur: NEGATIVE mg/dL

## 2010-11-18 LAB — URINALYSIS, ROUTINE W REFLEX MICROSCOPIC
Glucose, UA: NEGATIVE mg/dL
Nitrite: NEGATIVE
Protein, ur: NEGATIVE mg/dL
Specific Gravity, Urine: <1.005 — ABNORMAL LOW (ref 1.005–1.030)
Urobilinogen, UA: 0.2 mg/dL (ref 0.0–1.0)
pH: 6 (ref 5.0–8.0)

## 2010-11-18 LAB — DIFFERENTIAL
Eosinophils Absolute: 0 10*3/uL (ref 0.0–0.7)
Eosinophils Relative: 0 % (ref 0–5)
Monocytes Relative: 3 % (ref 3–12)

## 2010-11-18 LAB — CBC
HCT: 38.9 % (ref 36.0–46.0)
Hemoglobin: 13.4 g/dL (ref 12.0–15.0)
MCH: 29.8 pg (ref 26.0–34.0)
MCV: 86.3 fL (ref 78.0–100.0)
Platelets: 297 10*3/uL (ref 150–400)
RBC: 4.51 MIL/uL (ref 3.87–5.11)
RDW: 14.2 % (ref 11.5–15.5)
WBC: 11 10*3/uL — ABNORMAL HIGH (ref 4.0–10.5)

## 2010-11-18 LAB — COMPREHENSIVE METABOLIC PANEL
AST: 20 U/L (ref 0–37)
Albumin: 3.8 g/dL (ref 3.5–5.2)
Chloride: 101 mEq/L (ref 96–112)
GFR calc non Af Amer: >60 mL/min (ref >60)
Sodium: 134 mEq/L — ABNORMAL LOW (ref 135–145)
Total Bilirubin: 0.5 mg/dL (ref 0.3–1.2)
Total Protein: 7.1 g/dL (ref 6.0–8.3)

## 2010-11-18 LAB — CK TOTAL AND CKMB (NOT AT ARMC)
CK, MB: 1.5 ng/mL (ref 0.3–4.0)
Relative Index: 1.3 (ref 0.0–2.5)

## 2010-11-19 ENCOUNTER — Telehealth: Payer: Self-pay | Admitting: Internal Medicine

## 2010-11-20 LAB — BASIC METABOLIC PANEL
BUN: 5 mg/dL — ABNORMAL LOW (ref 6–23)
CO2: 28 mEq/L (ref 19–32)
Calcium: 9.6 mg/dL (ref 8.4–10.5)
Calcium: 8.4 mg/dL (ref 8.4–10.5)
Creatinine, Ser: 0.77 mg/dL (ref 0.4–1.2)
Creatinine, Ser: 0.81 mg/dL (ref 0.4–1.2)
GFR calc Af Amer: >60 mL/min (ref >60)
GFR calc Af Amer: >60 mL/min (ref >60)
GFR calc non Af Amer: >60 mL/min (ref >60)
GFR calc non Af Amer: >60 mL/min (ref >60)
GFR calc non Af Amer: >60 mL/min (ref >60)
Glucose, Bld: 106 mg/dL — ABNORMAL HIGH (ref 70–99)
Glucose, Bld: 91 mg/dL (ref 70–99)
Potassium: 3.3 mEq/L — ABNORMAL LOW (ref 3.5–5.1)
Sodium: 140 mEq/L (ref 135–145)
Sodium: 141 mEq/L (ref 135–145)

## 2010-11-20 LAB — URINALYSIS, ROUTINE W REFLEX MICROSCOPIC
Bilirubin Urine: NEGATIVE
Bilirubin Urine: NEGATIVE
Bilirubin Urine: NEGATIVE
Hgb urine dipstick: NEGATIVE
Ketones, ur: NEGATIVE mg/dL
Nitrite: NEGATIVE
Nitrite: NEGATIVE
Nitrite: NEGATIVE
Protein, ur: NEGATIVE mg/dL
Specific Gravity, Urine: 1.01 (ref 1.005–1.030)
Specific Gravity, Urine: 1.008 (ref 1.005–1.030)
Specific Gravity, Urine: 1.021 (ref 1.005–1.030)
Urobilinogen, UA: 0.2 mg/dL (ref 0.0–1.0)
Urobilinogen, UA: 1 mg/dL (ref 0.0–1.0)
Urobilinogen, UA: 1 mg/dL (ref 0.0–1.0)
pH: 5.5 (ref 5.0–8.0)
pH: 6 (ref 5.0–8.0)

## 2010-11-20 LAB — CBC
HCT: 45.4 % (ref 36.0–46.0)
Hemoglobin: 15.3 g/dL — ABNORMAL HIGH (ref 12.0–15.0)
MCH: 29.2 pg (ref 26.0–34.0)
MCH: 28.9 pg (ref 26.0–34.0)
MCHC: 33.7 g/dL (ref 30.0–36.0)
MCHC: 33.9 g/dL (ref 30.0–36.0)
MCHC: 34.1 g/dL (ref 30.0–36.0)
MCV: 86.5 fL (ref 78.0–100.0)
MCV: 85 fL (ref 78.0–100.0)
Platelets: 373 10*3/uL (ref 150–400)
Platelets: 306 10*3/uL (ref 150–400)
Platelets: 335 10*3/uL (ref 150–400)
Platelets: 379 10*3/uL (ref 150–400)
RBC: 5.25 MIL/uL — ABNORMAL HIGH (ref 3.87–5.11)
RBC: 4.19 MIL/uL (ref 3.87–5.11)
RDW: 14.2 % (ref 11.5–15.5)
RDW: 13.9 % (ref 11.5–15.5)
RDW: 14.5 % (ref 11.5–15.5)
WBC: 14.9 10*3/uL — ABNORMAL HIGH (ref 4.0–10.5)
WBC: 15.7 10*3/uL — ABNORMAL HIGH (ref 4.0–10.5)
WBC: 17 10*3/uL — ABNORMAL HIGH (ref 4.0–10.5)

## 2010-11-20 LAB — COMPREHENSIVE METABOLIC PANEL
AST: 15 U/L (ref 0–37)
Albumin: 2.9 g/dL — ABNORMAL LOW (ref 3.5–5.2)
Alkaline Phosphatase: 51 U/L (ref 39–117)
BUN: 6 mg/dL (ref 6–23)
Chloride: 111 mEq/L (ref 96–112)
GFR calc Af Amer: >60 mL/min (ref >60)
Potassium: 4.1 mEq/L (ref 3.5–5.1)
Sodium: 140 mEq/L (ref 135–145)
Total Bilirubin: 0.5 mg/dL (ref 0.3–1.2)
Total Protein: 5.5 g/dL — ABNORMAL LOW (ref 6.0–8.3)

## 2010-11-20 LAB — URINE CULTURE: Culture: NO GROWTH

## 2010-11-20 LAB — DIFFERENTIAL
Basophils Absolute: 0 10*3/uL (ref 0.0–0.1)
Basophils Absolute: 0.1 10*3/uL (ref 0.0–0.1)
Basophils Absolute: 0.1 10*3/uL (ref 0.0–0.1)
Basophils Relative: 0 % (ref 0–1)
Basophils Relative: 0 % (ref 0–1)
Eosinophils Absolute: 0.1 10*3/uL (ref 0.0–0.7)
Eosinophils Absolute: 0.2 10*3/uL (ref 0.0–0.7)
Eosinophils Relative: 1 % (ref 0–5)
Eosinophils Relative: 1 % (ref 0–5)
Lymphocytes Relative: 18 % (ref 12–46)
Neutro Abs: 11.8 10*3/uL — ABNORMAL HIGH (ref 1.7–7.7)
Neutro Abs: 8.8 10*3/uL — ABNORMAL HIGH (ref 1.7–7.7)
Neutrophils Relative %: 63 % (ref 43–77)

## 2010-11-20 LAB — POCT PREGNANCY, URINE: Preg Test, Ur: NEGATIVE

## 2010-11-20 LAB — CORTISOL: Cortisol, Plasma: 8.5 ug/dL

## 2010-11-20 LAB — LIPASE, BLOOD: Lipase: 28 U/L (ref 11–59)

## 2010-11-20 LAB — HEMOCCULT GUIAC POC 1CARD (OFFICE): Fecal Occult Bld: NEGATIVE

## 2010-11-23 LAB — DIFFERENTIAL
Basophils Absolute: 0 10*3/uL (ref 0.0–0.1)
Eosinophils Absolute: 0 10*3/uL (ref 0.0–0.7)
Eosinophils Absolute: 0.2 10*3/uL (ref 0.0–0.7)
Eosinophils Relative: 0 % (ref 0–5)
Eosinophils Relative: 1 % (ref 0–5)
Eosinophils Relative: 2 % (ref 0–5)
Lymphocytes Relative: 16 % (ref 12–46)
Lymphocytes Relative: 25 % (ref 12–46)
Lymphs Abs: 3.2 10*3/uL (ref 0.7–4.0)
Lymphs Abs: 3.7 10*3/uL (ref 0.7–4.0)
Monocytes Absolute: 0.8 10*3/uL (ref 0.1–1.0)
Monocytes Absolute: 0.9 10*3/uL (ref 0.1–1.0)
Monocytes Absolute: 1 10*3/uL (ref 0.1–1.0)

## 2010-11-23 LAB — BASIC METABOLIC PANEL
BUN: 5 mg/dL — ABNORMAL LOW (ref 6–23)
CO2: 25 mEq/L (ref 19–32)
Chloride: 103 mEq/L (ref 96–112)
Chloride: 106 mEq/L (ref 96–112)
Creatinine, Ser: 0.64 mg/dL (ref 0.4–1.2)
GFR calc Af Amer: >60 mL/min (ref >60)
GFR calc non Af Amer: >60 mL/min (ref >60)
Glucose, Bld: 86 mg/dL (ref 70–99)
Potassium: 3.4 mEq/L — ABNORMAL LOW (ref 3.5–5.1)
Potassium: 3.7 mEq/L (ref 3.5–5.1)
Potassium: 4.3 mEq/L (ref 3.5–5.1)
Sodium: 136 mEq/L (ref 135–145)
Sodium: 137 mEq/L (ref 135–145)

## 2010-11-23 LAB — COMPREHENSIVE METABOLIC PANEL
ALT: 10 U/L (ref 0–35)
AST: 15 U/L (ref 0–37)
Albumin: 4.2 g/dL (ref 3.5–5.2)
Chloride: 103 mEq/L (ref 96–112)
Creatinine, Ser: 0.77 mg/dL (ref 0.4–1.2)
GFR calc Af Amer: >60 mL/min (ref >60)
Potassium: 2.7 mEq/L — CL (ref 3.5–5.1)
Sodium: 138 mEq/L (ref 135–145)
Total Bilirubin: 0.8 mg/dL (ref 0.3–1.2)

## 2010-11-23 LAB — CBC
HCT: 38.1 % (ref 36.0–46.0)
HCT: 38.1 % (ref 36.0–46.0)
Hemoglobin: 13.4 g/dL (ref 12.0–15.0)
MCV: 85.5 fL (ref 78.0–100.0)
Platelets: 247 10*3/uL (ref 150–400)
Platelets: 340 10*3/uL (ref 150–400)
RBC: 4.43 MIL/uL (ref 3.87–5.11)
RBC: 4.86 MIL/uL (ref 3.87–5.11)
WBC: 12.6 10*3/uL — ABNORMAL HIGH (ref 4.0–10.5)
WBC: 14 10*3/uL — ABNORMAL HIGH (ref 4.0–10.5)
WBC: 9.2 10*3/uL (ref 4.0–10.5)

## 2010-11-23 LAB — URINE MICROSCOPIC-ADD ON

## 2010-11-23 LAB — CLOSTRIDIUM DIFFICILE EIA: C difficile Toxins A+B, EIA: NEGATIVE

## 2010-11-23 LAB — FECAL LACTOFERRIN, QUANT: Fecal Lactoferrin: NEGATIVE

## 2010-11-23 LAB — TSH: TSH: 5.899 u[IU]/mL — ABNORMAL HIGH (ref 0.350–4.500)

## 2010-11-23 LAB — STOOL CULTURE

## 2010-11-23 LAB — URINALYSIS, ROUTINE W REFLEX MICROSCOPIC
Specific Gravity, Urine: >1.03 — ABNORMAL HIGH (ref 1.005–1.030)
Urobilinogen, UA: 0.2 mg/dL (ref 0.0–1.0)
pH: 6.5 (ref 5.0–8.0)

## 2010-11-23 LAB — POCT HEMOGLOBIN-HEMACUE: Hemoglobin: 13.8 g/dL (ref 12.0–15.0)

## 2010-11-23 LAB — CORTISOL: Cortisol, Plasma: 1.2 ug/dL

## 2010-11-24 NOTE — Progress Notes (Signed)
Summary: Pt have questions    Phone Note Call from Patient Call back at Home Phone [phone]   Caller: Patient Summary of Call: Pt questions about medications and rather she should still see Dr.Ifrah Vest due to being taken off all heart medication Initial call taken by: Judie Grieve,  November 19, 2010 1:37 PM  Follow-up for Phone Call        spoke w/pt shes states Coreg and Lisinopril were stopped in May 2011, she is not sure if she should be on these meds for her heart or not, pt hasn't been seen in years, appt sch for 4/10 Meredith Staggers, RN  November 19, 2010 2:04 PM

## 2010-11-29 ENCOUNTER — Emergency Department (HOSPITAL_COMMUNITY)
Admission: EM | Admit: 2010-11-29 | Discharge: 2010-11-29 | Disposition: A | Discharge status: Home / Self Care | Payer: BC Managed Care – PPO | Attending: Emergency Medicine | Admitting: Emergency Medicine

## 2010-11-29 DIAGNOSIS — E039 Hypothyroidism, unspecified: Secondary | ICD-10-CM | POA: Insufficient documentation

## 2010-11-29 DIAGNOSIS — E2749 Other adrenocortical insufficiency: Secondary | ICD-10-CM | POA: Insufficient documentation

## 2010-11-29 DIAGNOSIS — Z79899 Other long term (current) drug therapy: Secondary | ICD-10-CM | POA: Insufficient documentation

## 2010-11-29 DIAGNOSIS — G8929 Other chronic pain: Secondary | ICD-10-CM | POA: Insufficient documentation

## 2010-11-29 DIAGNOSIS — H538 Other visual disturbances: Secondary | ICD-10-CM | POA: Insufficient documentation

## 2010-11-29 DIAGNOSIS — M549 Dorsalgia, unspecified: Secondary | ICD-10-CM | POA: Insufficient documentation

## 2010-11-29 DIAGNOSIS — H5710 Ocular pain, unspecified eye: Secondary | ICD-10-CM | POA: Insufficient documentation

## 2010-11-29 DIAGNOSIS — I10 Essential (primary) hypertension: Secondary | ICD-10-CM | POA: Insufficient documentation

## 2010-12-01 ENCOUNTER — Emergency Department (HOSPITAL_COMMUNITY): Payer: BC Managed Care – PPO

## 2010-12-01 ENCOUNTER — Encounter: Payer: Self-pay | Admitting: Internal Medicine

## 2010-12-01 ENCOUNTER — Observation Stay (HOSPITAL_COMMUNITY)
Admission: EM | Admit: 2010-12-01 | Discharge: 2010-12-02 | Disposition: A | Discharge status: Home / Self Care | Payer: BC Managed Care – PPO | Source: Ambulatory Visit | Attending: Internal Medicine | Admitting: Internal Medicine

## 2010-12-01 DIAGNOSIS — R110 Nausea: Secondary | ICD-10-CM | POA: Insufficient documentation

## 2010-12-01 DIAGNOSIS — E2749 Other adrenocortical insufficiency: Secondary | ICD-10-CM | POA: Insufficient documentation

## 2010-12-01 DIAGNOSIS — K3184 Gastroparesis: Secondary | ICD-10-CM | POA: Insufficient documentation

## 2010-12-01 DIAGNOSIS — R0602 Shortness of breath: Secondary | ICD-10-CM | POA: Insufficient documentation

## 2010-12-01 DIAGNOSIS — G8929 Other chronic pain: Secondary | ICD-10-CM | POA: Insufficient documentation

## 2010-12-01 DIAGNOSIS — F411 Generalized anxiety disorder: Secondary | ICD-10-CM | POA: Insufficient documentation

## 2010-12-01 DIAGNOSIS — R079 Chest pain, unspecified: Principal | ICD-10-CM | POA: Insufficient documentation

## 2010-12-01 DIAGNOSIS — M549 Dorsalgia, unspecified: Secondary | ICD-10-CM | POA: Insufficient documentation

## 2010-12-01 LAB — POCT I-STAT, CHEM 8
Calcium, Ion: 1.12 mmol/L (ref 1.12–1.32)
Hemoglobin: 14.3 g/dL (ref 12.0–15.0)
Sodium: 140 mEq/L (ref 135–145)
TCO2: 26 mmol/L (ref 0–100)

## 2010-12-01 LAB — TROPONIN I: Troponin I: 0.01 ng/mL (ref 0.00–0.06)

## 2010-12-01 LAB — CBC
Hemoglobin: 13.7 g/dL (ref 12.0–15.0)
MCHC: 34.1 g/dL (ref 30.0–36.0)
Platelets: 356 10*3/uL (ref 150–400)
RBC: 4.7 MIL/uL (ref 3.87–5.11)

## 2010-12-01 LAB — DIFFERENTIAL
Basophils Absolute: 0.1 10*3/uL (ref 0.0–0.1)
Basophils Relative: 0 % (ref 0–1)
Eosinophils Absolute: 0.2 10*3/uL (ref 0.0–0.7)
Neutro Abs: 8.1 10*3/uL — ABNORMAL HIGH (ref 1.7–7.7)
Neutrophils Relative %: 66 % (ref 43–77)

## 2010-12-01 LAB — URINALYSIS, ROUTINE W REFLEX MICROSCOPIC
Ketones, ur: NEGATIVE mg/dL
Nitrite: NEGATIVE
Protein, ur: NEGATIVE mg/dL
Urobilinogen, UA: 1 mg/dL (ref 0.0–1.0)
pH: 6.5 (ref 5.0–8.0)

## 2010-12-01 LAB — POCT CARDIAC MARKERS
CKMB, poc: <1 ng/mL — ABNORMAL LOW (ref 1.0–8.0)
Myoglobin, poc: 30.9 ng/mL (ref 12–200)
Troponin i, poc: <0.05 ng/mL (ref 0.00–0.09)

## 2010-12-01 LAB — CK TOTAL AND CKMB (NOT AT ARMC)
CK, MB: 0.8 ng/mL (ref 0.3–4.0)
Total CK: 56 U/L (ref 7–177)

## 2010-12-02 ENCOUNTER — Observation Stay (HOSPITAL_COMMUNITY): Payer: BC Managed Care – PPO

## 2010-12-02 LAB — CARDIAC PANEL(CRET KIN+CKTOT+MB+TROPI)
Relative Index: INVALID (ref 0.0–2.5)
Troponin I: 0.02 ng/mL (ref 0.00–0.06)

## 2010-12-02 LAB — MRSA PCR SCREENING: MRSA by PCR: NEGATIVE

## 2010-12-02 MED ORDER — TECHNETIUM TC 99M TETROFOSMIN IV KIT
10.0000 | PACK | Freq: Once | INTRAVENOUS | Status: AC | PRN
  Administered 2010-12-02: 10 via INTRAVENOUS

## 2010-12-02 MED ORDER — TECHNETIUM TC 99M TETROFOSMIN IV KIT
30.0000 | PACK | Freq: Once | INTRAVENOUS | Status: AC | PRN
  Administered 2010-12-02: 30 via INTRAVENOUS

## 2010-12-03 ENCOUNTER — Other Ambulatory Visit (HOSPITAL_COMMUNITY): Payer: BC Managed Care – PPO

## 2010-12-03 LAB — CULTURE, ROUTINE-ABSCESS: Culture: NO GROWTH

## 2010-12-14 ENCOUNTER — Encounter: Payer: Self-pay | Admitting: Internal Medicine

## 2010-12-14 ENCOUNTER — Encounter: Payer: Self-pay | Admitting: *Deleted

## 2010-12-14 NOTE — Discharge Summary (Signed)
NAMEEFFA, [name] NO.:  [id number]  MEDICAL RECORD NO.:  [id number]           PATIENT TYPE:  O  LOCATION:  3739                         FACILITY:  [facility]  PHYSICIAN:  Rock Nephew, MD       DATE OF BIRTH:  1966/04/27  DATE OF ADMISSION:  12/01/2010 DATE OF DISCHARGE:  12/02/2010                        DISCHARGE SUMMARY - REFERRING   PRIMARY CARE PHYSICIAN:  Madelin Rear. Sherwood Gambler, MD  DISCHARGE DIAGNOSES: 1. Chest pain thought to be noncardiac etiology, stress test negative. 2. Adrenal insufficiency. 3. Chronic back pain. 4. Anxiety. 5. History of gastroparesis. 6. History of lightheadedness. 7. History of coronary artery disease, possibly as a cardiac cath in     the past.  Cardiac cath in July 2008 showed 60% lesion in the right     coronary artery.  DISCHARGE MEDICATIONS: 1. Aspirin enteric-coated 81 mg p.o. daily. 2. Meclizine 12.5 mg three times a day as needed. 3. Reglan 10 mg 1 tablet by mouth before meals as needed. 4. Hydrocortisone 10 mg 2 tablets in the morning and 1 tablet in the     evening. 5. Lorazepam 1 mg by mouth four times a day. 6. Zofran 8 mg by mouth every 6 hours as needed. 7. Protonix 40 mg p.o. daily as needed. 8. Oxycodone 15 mg 2 tablets by mouth every 6 hours as needed. 9. Synthroid 100 mcg p.o. daily.  DISPOSITION:  The patient is discharged home.  FOLLOWUP:  The patient should follow up with Dr. Sherwood Gambler within 1 week. The patient should also follow up with Dr. Matthias Hughs in 1-2 weeks for further evaluation of gastroparesis and abdominal discomfort.  CONSULTATIONS ON THIS CASE:  Dr. Rollene Rotunda, [facility].  PROCEDURES PERFORMED:  The patient had a nuclear stress test, which showed was a normal examination without evidence of pharmacological- induced myocardial ischemia and the calculated ejection fraction was 53%.  The patient's chest x-ray showed no evidence of acute cardiopulmonary disease.  DIET:  The  patient's diet should be heart-healthy.  BRIEF HISTORY OF PRESENT ILLNESS:  This is a 45 year old female, who presented with chest pain.  The chest pain and features of both typical and atypical features.  The patient's cardiac enzymes were negative and EKG had no acute changes.  HOSPITAL COURSE: 1. Chest pain.  The patient was admitted to the hospital telemetry.     The patient was placed on a baby aspirin.  The patient was seen by     [facility].  The stress test was negative.  It was     determined the chest pain was a noncardiac etiology. 2. Adrenal insufficiency.  The patient was continued on home-dose     hydrocortisone. 3. Chronic back pain.  The patient takes oxycodone as needed. 4. Anxiety.  The patient was stable. 5. Gastroparesis.  The patient has a history of gastroparesis and she     has seen gastroenterologist in [place].  She also has seen     Dr. Matthias Hughs.  Currently, I asked the patient she can take Reglan 10     mg before meals for the next few  days and also see Dr. Matthias Hughs in     the next 1-2 weeks. 6. Lightheadedness.  The patient complained of some lightheadedness     and also some vertigo symptoms.  The patient will be given a     prescription of meclizine to take for the next few days.     Rock Nephew, MD     NH/MEDQ  D:  12/02/2010  T:  12/02/2010  Job:  [id number]  cc:   Madelin Rear. Sherwood Gambler, MD Bevelyn Buckles. Bensimhon, MD Rollene Rotunda, MD, [facility] Dorisann Frames, M.D. Bernette Redbird, M.D.  Electronically Signed by Rock Nephew MD on 12/14/2010 04:12:16 PM

## 2010-12-15 ENCOUNTER — Ambulatory Visit (INDEPENDENT_AMBULATORY_CARE_PROVIDER_SITE_OTHER): Payer: BC Managed Care – PPO | Admitting: Internal Medicine

## 2010-12-15 ENCOUNTER — Encounter: Payer: Self-pay | Admitting: Internal Medicine

## 2010-12-15 VITALS — BP 153/92 | HR 95 | Resp 18 | Ht 65.0 in | Wt 160.8 lb

## 2010-12-15 DIAGNOSIS — R42 Dizziness and giddiness: Secondary | ICD-10-CM

## 2010-12-15 DIAGNOSIS — R079 Chest pain, unspecified: Secondary | ICD-10-CM

## 2010-12-15 DIAGNOSIS — E785 Hyperlipidemia, unspecified: Secondary | ICD-10-CM

## 2010-12-15 DIAGNOSIS — R002 Palpitations: Secondary | ICD-10-CM

## 2010-12-15 DIAGNOSIS — R9431 Abnormal electrocardiogram [ECG] [EKG]: Secondary | ICD-10-CM | POA: Insufficient documentation

## 2010-12-15 LAB — COMPREHENSIVE METABOLIC PANEL
ALT: 9 U/L (ref 0–35)
Albumin: 3.7 g/dL (ref 3.5–5.2)
Alkaline Phosphatase: 64 U/L (ref 39–117)
BUN: 23 mg/dL (ref 6–23)
Chloride: 98 mEq/L (ref 96–112)
Glucose, Bld: 106 mg/dL — ABNORMAL HIGH (ref 70–99)
Potassium: 3.9 mEq/L (ref 3.5–5.1)
Sodium: 137 mEq/L (ref 135–145)
Total Bilirubin: 0.9 mg/dL (ref 0.3–1.2)
Total Protein: 6.9 g/dL (ref 6.0–8.3)

## 2010-12-15 LAB — DIFFERENTIAL
Eosinophils Absolute: 0.2 10*3/uL (ref 0.0–0.7)
Eosinophils Relative: 2 % (ref 0–5)
Lymphocytes Relative: 29 % (ref 12–46)
Lymphs Abs: 3.7 10*3/uL (ref 0.7–4.0)
Monocytes Absolute: 0.9 10*3/uL (ref 0.1–1.0)

## 2010-12-15 LAB — CBC
HCT: 42 % (ref 36.0–46.0)
Hemoglobin: 15 g/dL (ref 12.0–15.0)
Platelets: 299 10*3/uL (ref 150–400)
RDW: 13.6 % (ref 11.5–15.5)
WBC: 12.6 10*3/uL — ABNORMAL HIGH (ref 4.0–10.5)

## 2010-12-15 LAB — URINE MICROSCOPIC-ADD ON

## 2010-12-15 LAB — URINALYSIS, ROUTINE W REFLEX MICROSCOPIC
Glucose, UA: NEGATIVE mg/dL
Hgb urine dipstick: NEGATIVE
Ketones, ur: NEGATIVE mg/dL
pH: 6 (ref 5.0–8.0)

## 2010-12-15 LAB — PREGNANCY, URINE: Preg Test, Ur: NEGATIVE

## 2010-12-15 LAB — PROTIME-INR: Prothrombin Time: 13.7 seconds (ref 11.6–15.2)

## 2010-12-15 MED ORDER — MECLIZINE HCL 25 MG PO TABS: 25.0000 mg | ORAL_TABLET | Freq: Three times a day (TID) | ORAL | Status: AC | PRN

## 2010-12-15 MED ORDER — RED YEAST RICE 600 MG PO TABS: 1.0000 | ORAL_TABLET | Freq: Every day | ORAL | Status: DC

## 2010-12-15 NOTE — Assessment & Plan Note (Signed)
Possibly vertigo. Will check orthostatics and trial of meclizine. F/u PCP.

## 2010-12-15 NOTE — Assessment & Plan Note (Signed)
Unable to tolerate statins due to myalgias. Will try Red Yeast Rice. Start 1 tab qd.

## 2010-12-15 NOTE — Patient Instructions (Signed)
Meclizine 25mg  every 8 hours as needed Start Red Yeast Rice 1 tab daily Your physician has requested that you have an echocardiogram. Echocardiography is a painless test that uses sound waves to create images of your heart. It provides your doctor with information about the size and shape of your heart and how well your heart's chambers and valves are working. This procedure takes approximately one hour. There are no restrictions for this procedure. Your physician has recommended that you wear an event monitor. Event monitors are medical devices that record the heart's electrical activity. Doctors most often [place] these monitors to diagnose arrhythmias. Arrhythmias are problems with the speed or rhythm of the heartbeat. The monitor is a small, portable device. You can wear one while you do your normal daily activities. This is usually used to diagnose what is causing palpitations/syncope (passing out). Your physician wants you to follow-up in: 6 months. You will receive a reminder letter in the mail two months in advance. If you don't receive a letter, please call our office to schedule the follow-up appointment.

## 2010-12-15 NOTE — Progress Notes (Signed)
HPI:  [name] is 45 y/o woman with multiple medical problems including Addison's disease, idiopathic gastroparesis, hypothyroidism and CAD. Underwent cardiac cath in 2006 and 2008. In 2008 had mild non-obstructive CAD with 50-560% lesion in RCA and septal perforator. Also has h/o NICM with EF 40-45%. Most recent EF was 55-60%.  Admitted at the end of March with recurrent CP. ECG and cardiac markers were negative. Had Myoview which was normal EF 53% normal perfusion.  Since d/c continues to have occasional episodes of CP as well as lightheadedness. CP 2-3x/wk. Usually with emotional stress. Not with exertion.  Has been off heart medicines due to hypotension. Does get dizzy at times. Feels like room spinning. Not orthostatic. No syncope. No CHF. Takes BP at pharmacy 145/90s. Also occasional palpitations - 2-3x/week for 10-15 mins.   Unable to tolerate zocor and lipitor due to myalgias.   Said she quit smoking but breath smells like tobacco smoke.    ROS: All systems negative except as listed in HPI, PMH and Problem List.  Past Medical History  Diagnosis Date  . Cardiomyopathy     resolved  . Chest pain     chronicc  . Hyperlipidemia   . HTN (hypertension)   . Hypothyroidism   . Adrenal insufficiency   . Anxiety   . Nondiabetic gastroparesis   . CAD (coronary artery disease)     Cath 2008 EF normal. RCA 50-60, Septal 50%. Myoview 3/12: EF 53% normal perfusion  . Chronic back pain   . D (diarrhea)   . History of CHF (congestive heart failure)   . QT prolongation     Current Outpatient Prescriptions  Medication Sig Dispense Refill  . aspirin 81 MG tablet Take 81 mg by mouth daily.        . hydrocortisone (CORTEF) 10 MG tablet Take 10 mg by mouth. Take 2 tablets in the morning and 2 tablets in the evening        . levothyroxine (SYNTHROID, LEVOTHROID) 100 MCG tablet Take 100 mcg by mouth daily.        Marland Kitchen LORazepam (ATIVAN) 1 MG tablet Take 1 mg by mouth 4 (four) times daily.        .  metoCLOPramide (REGLAN) 10 MG tablet Take 10 mg by mouth 3 (three) times daily before meals. As needed       . ondansetron (ZOFRAN) 8 MG tablet Take by mouth every 6 (six) hours as needed.        Marland Kitchen oxyCODONE (ROXICODONE) 15 MG immediate release tablet Take 15 mg by mouth every 6 (six) hours as needed.        . pantoprazole (PROTONIX) 40 MG tablet Take 40 mg by mouth daily as needed.        . tobramycin-dexamethasone (TOBRADEX) ophthalmic solution 1 drop every 4 (four) hours while awake.        Marland Kitchen DISCONTD: carvedilol (COREG CR) 40 MG 24 hr capsule Take 40 mg by mouth daily.        Marland Kitchen DISCONTD: doxycycline (ADOXA) 100 MG tablet Take 100 mg by mouth 2 (two) times daily.        Marland Kitchen DISCONTD: lisinopril-hydrochlorothiazide (PRINZIDE,ZESTORETIC) 20-25 MG per tablet Take 1 tablet by mouth daily.        Marland Kitchen DISCONTD: meclizine (ANTIVERT) 12.5 MG tablet Take 12.5 mg by mouth 3 (three) times daily as needed.        Marland Kitchen DISCONTD: oxyCODONE-acetaminophen (PERCOCET) 10-325 MG per tablet Take 1 tablet by mouth every 4 (  four) hours as needed.        Marland Kitchen DISCONTD: simvastatin (ZOCOR) 20 MG tablet Take 20 mg by mouth at bedtime.           PHYSICAL EXAM:  General: No resp difficulty. Smells like tobacco. Respirations unlabored. HEENT: normal Neck: supple. JVP flat. Carotids 2+ bilaterally; no bruits. No lymphadenopathy or thryomegaly appreciated. Cor: PMI normal. Regular rate & rhythm. No rubs, gallops or murmurs. Lungs: clear Abdomen: soft, nontender, nondistended. No hepatosplenomegaly. No bruits or masses. Good bowel sounds. Extremities: no cyanosis, clubbing, rash, edema Neuro: alert & orientedx3, cranial nerves grossly intact. Moves all 4 extremities w/o difficulty. Affect pleasant.    ECG: NSR 94. No ST-T wave abnormalities. Mild QT prolongation QTc    ASSESSMENT & PLAN:

## 2010-12-15 NOTE — Assessment & Plan Note (Signed)
Given recent stress test, I doubt these are ischemic. Continue risk factor management. Can f/u with GI as needed.

## 2010-12-15 NOTE — Assessment & Plan Note (Signed)
No high risk features. Checking monitor. Avoid QT prolonging drugs.

## 2010-12-15 NOTE — Assessment & Plan Note (Signed)
Given prolonged episodes will check event monitor and echo. F/u with endo to make sure TSH OK.

## 2010-12-16 ENCOUNTER — Telehealth: Payer: Self-pay | Admitting: Internal Medicine

## 2010-12-16 NOTE — Telephone Encounter (Signed)
Spoke w/pt regarding appt yest

## 2010-12-21 ENCOUNTER — Telehealth: Payer: Self-pay | Admitting: Internal Medicine

## 2010-12-21 LAB — POCT I-STAT, CHEM 8
Calcium, Ion: 1.22 mmol/L (ref 1.12–1.32)
Glucose, Bld: 97 mg/dL (ref 70–99)
HCT: 50 % — ABNORMAL HIGH (ref 36.0–46.0)
Hemoglobin: 17 g/dL — ABNORMAL HIGH (ref 12.0–15.0)
Sodium: 143 mEq/L (ref 135–145)
TCO2: 27 mmol/L (ref 0–100)

## 2010-12-21 NOTE — Telephone Encounter (Signed)
Spoke w/pt she states she noticed on Fri she began to have sores in her mouth, it has gotten worse they are like ulcers, and it is painful, don't know if it's related to red yeast rice, she is going to call Dr Talmage Nap in AM to check w/them and f/u

## 2010-12-22 ENCOUNTER — Ambulatory Visit (HOSPITAL_COMMUNITY): Payer: BC Managed Care – PPO | Attending: Internal Medicine

## 2010-12-22 ENCOUNTER — Ambulatory Visit (INDEPENDENT_AMBULATORY_CARE_PROVIDER_SITE_OTHER): Payer: BC Managed Care – PPO

## 2010-12-22 DIAGNOSIS — R002 Palpitations: Secondary | ICD-10-CM

## 2010-12-27 IMAGING — CT CT ABD-PELV W/ CM
1 of 3 series · 14 of 32 positions shown, 19 images · IV contrast (APPLIED)
Comparison: 01/20/2010

CLINICAL DATA: Nausea, vomiting, chills.  History of gastroparesis.
CHF, GERD, hypertension, hypothyroid, in the P.  Rule out
intestinal problem problems.

CT ABDOMEN AND PELVIS WITH CONTRAST
TECHNIQUE: Multidetector CT imaging of the abdomen and pelvis was
performed following the standard protocol during bolus
administration of intravenous contrast.
Contrast: 100 ml Nmnipaque-O33

[Series 2: abd/pelv with 5.0 b31f st · axial · 0.71mm/px · z∈[+789,+1204]mm · 14 of 93 slices shown, 19 images]
[im 5/93  soft-tissue]
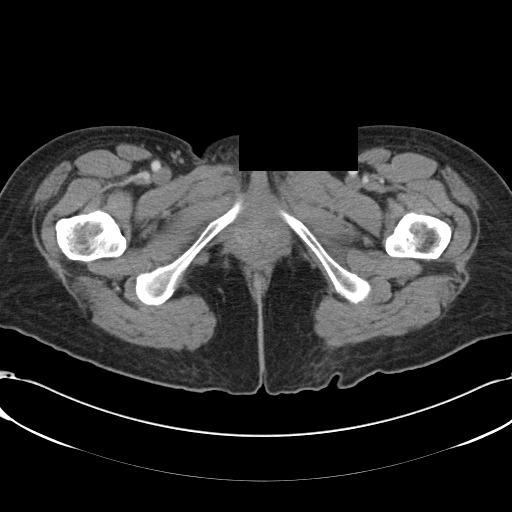
[im 5/93  bone]
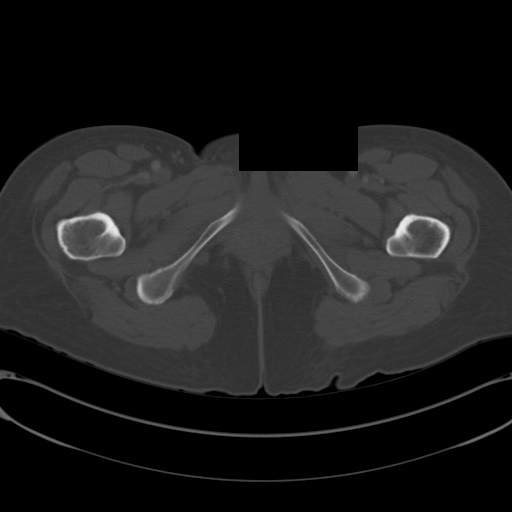
[im 15/93  soft-tissue]
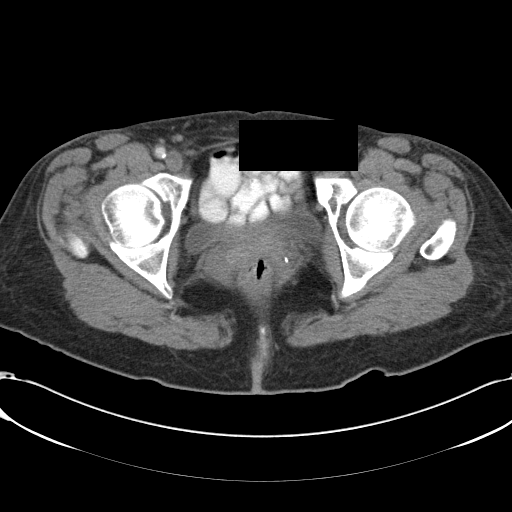
[im 20/93  soft-tissue]
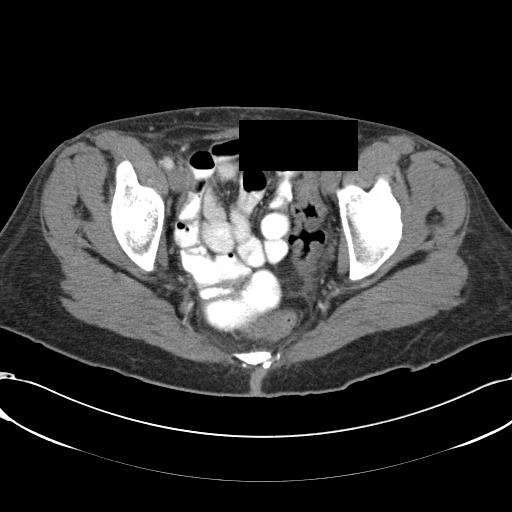
[im 25/93  soft-tissue]
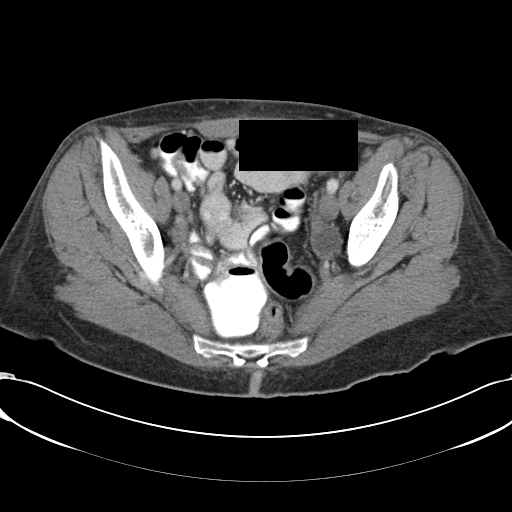
[im 34/93  soft-tissue]
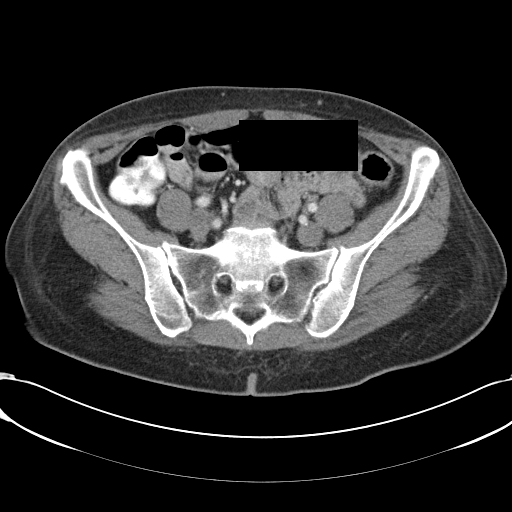
[im 39/93  soft-tissue]
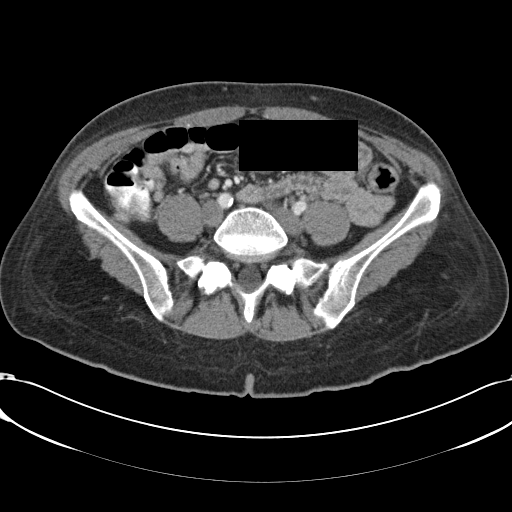
[im 49/93  soft-tissue]
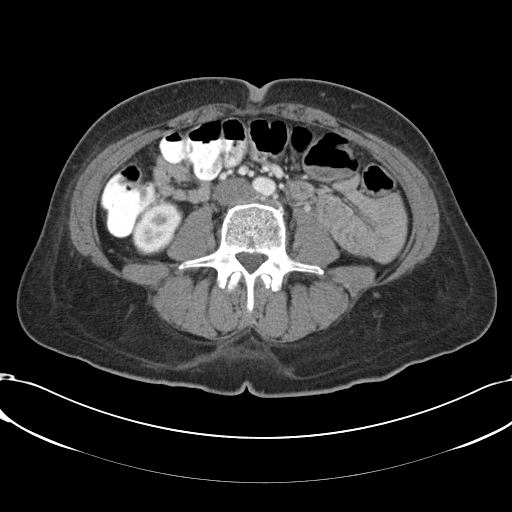
[im 54/93  soft-tissue]
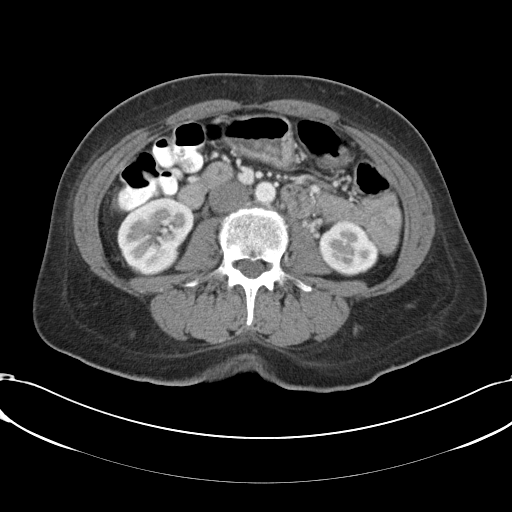
[im 59/93  soft-tissue]
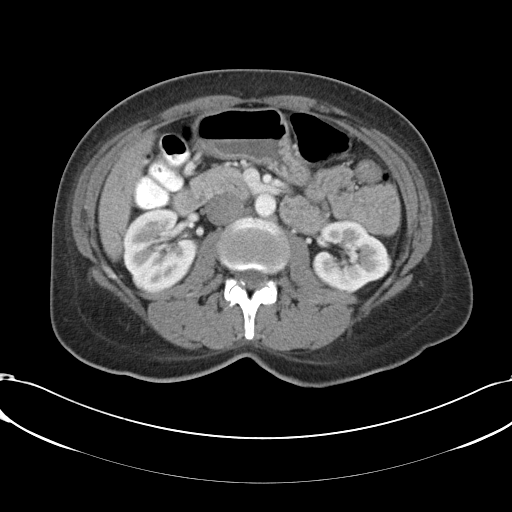
[im 59/93  bone]
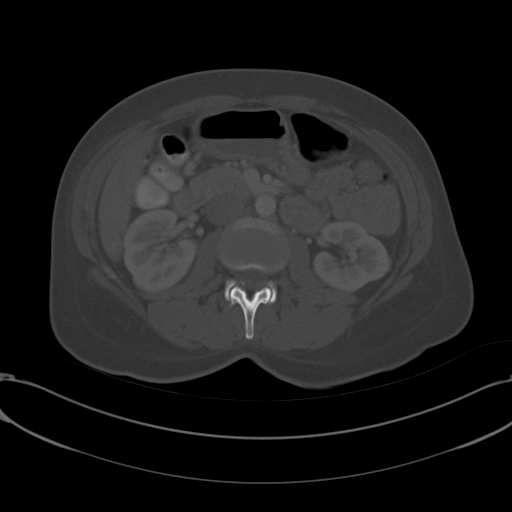
[im 68/93  soft-tissue]
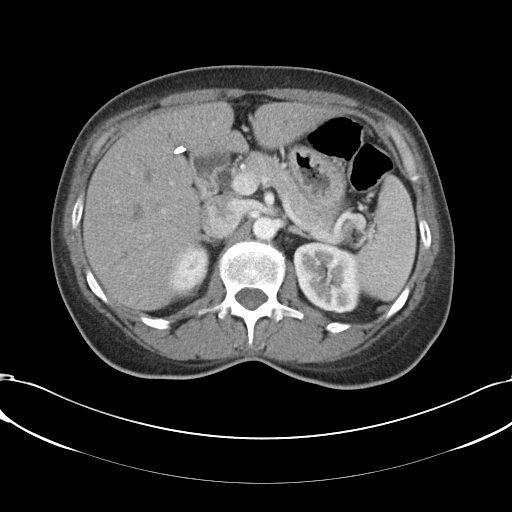
[im 73/93  soft-tissue]
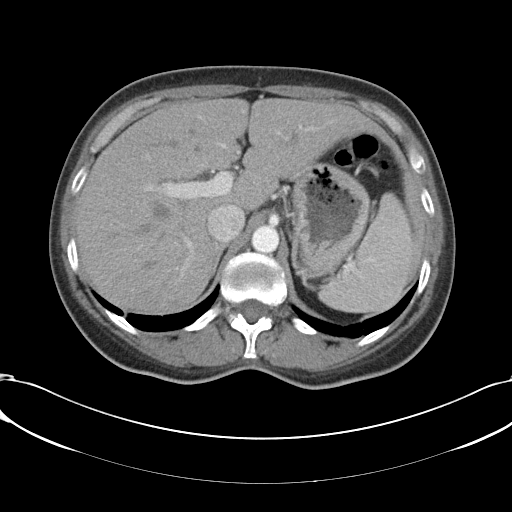
[im 73/93  lung]
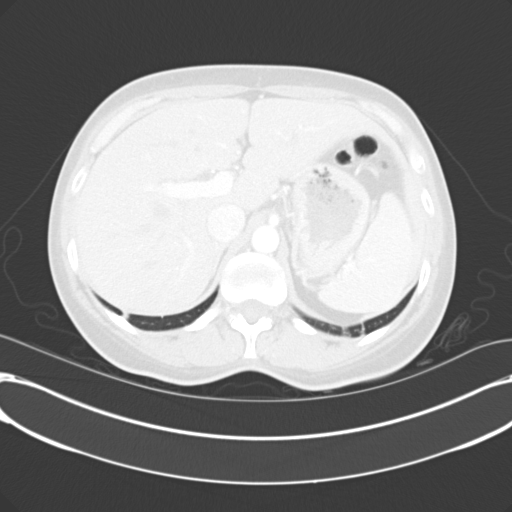
[im 78/93  soft-tissue]
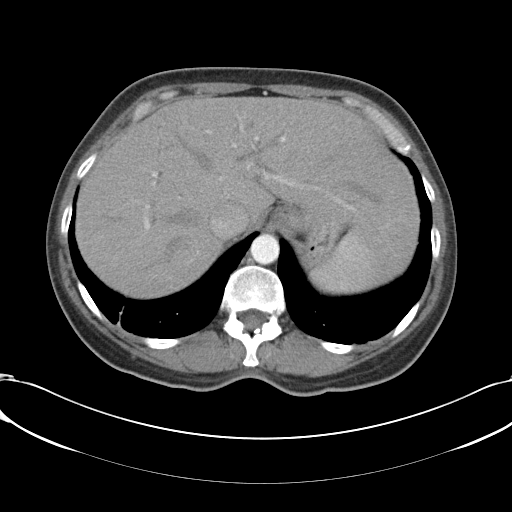
[im 78/93  lung]
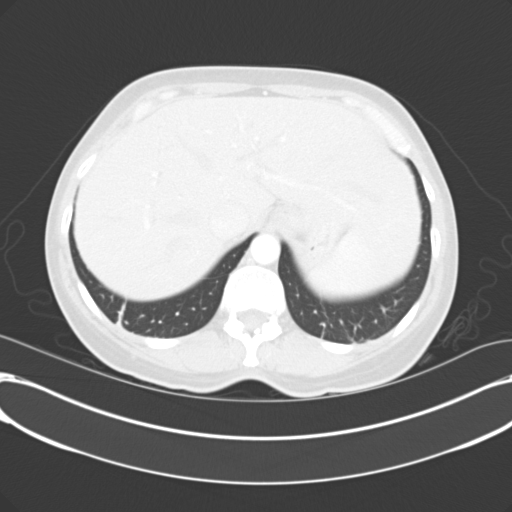
[im 83/93  lung]
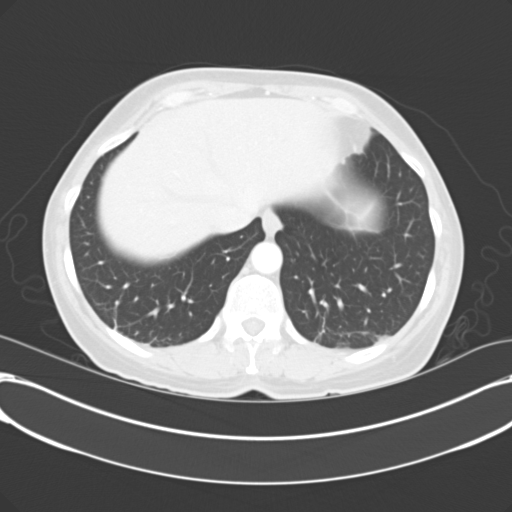
[im 88/93  soft-tissue]
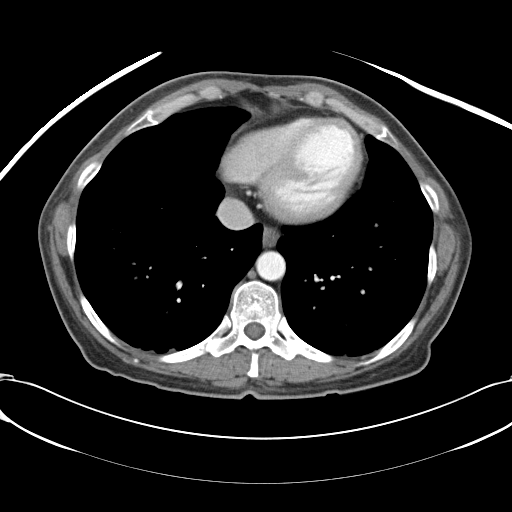
[im 88/93  lung]
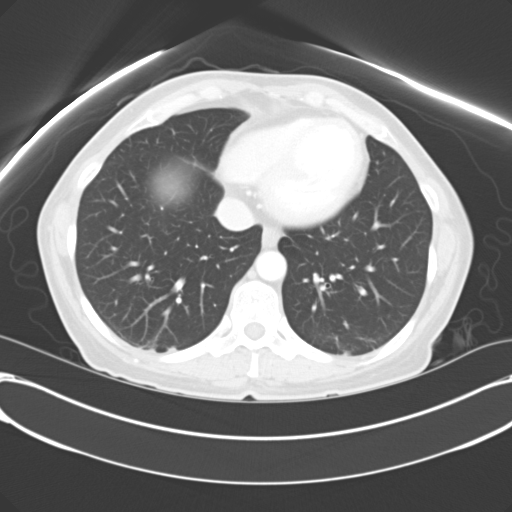

[14 of 32 positions shown; findings below may reference images not displayed]

FINDINGS: There is minimal atelectasis at both lung bases.  Patient
has had prior cholecystectomy.  There is compensatory dilatation of
the common bile duct, within normal limits.  No focal abnormalities
identified within the liver, spleen, pancreas, adrenal glands, or
kidneys.  The aorta is normal in caliber.  The stomach and duodenum
have a normal appearance.  The appendix is well seen and has a
normal appearance.  Small and large bowel loops have normal bowel
wall thickness and caliber.

The uterus is absent.  Small follicles are identified in the
adnexal regions bilaterally.  No free pelvic fluid or pelvic
adenopathy. Mild degenerative changes are seen in the lumbar spine.
IMPRESSION: 1.  Status post cholecystectomy, hysterectomy.
2. No evidence for acute abdominal or pelvic abnormality.

## 2010-12-28 ENCOUNTER — Telehealth: Payer: Self-pay | Admitting: Internal Medicine

## 2010-12-29 NOTE — Telephone Encounter (Signed)
Pt given echo results 

## 2010-12-31 ENCOUNTER — Emergency Department (HOSPITAL_COMMUNITY): Payer: BC Managed Care – PPO

## 2010-12-31 ENCOUNTER — Emergency Department (HOSPITAL_COMMUNITY)
Admission: EM | Admit: 2010-12-31 | Discharge: 2010-12-31 | Disposition: A | Discharge status: Home / Self Care | Payer: BC Managed Care – PPO | Attending: Emergency Medicine | Admitting: Emergency Medicine

## 2010-12-31 DIAGNOSIS — I509 Heart failure, unspecified: Secondary | ICD-10-CM | POA: Insufficient documentation

## 2010-12-31 DIAGNOSIS — R112 Nausea with vomiting, unspecified: Secondary | ICD-10-CM | POA: Insufficient documentation

## 2010-12-31 DIAGNOSIS — I059 Rheumatic mitral valve disease, unspecified: Secondary | ICD-10-CM | POA: Insufficient documentation

## 2010-12-31 DIAGNOSIS — E2749 Other adrenocortical insufficiency: Secondary | ICD-10-CM | POA: Insufficient documentation

## 2010-12-31 DIAGNOSIS — R109 Unspecified abdominal pain: Secondary | ICD-10-CM | POA: Insufficient documentation

## 2010-12-31 DIAGNOSIS — R350 Frequency of micturition: Secondary | ICD-10-CM | POA: Insufficient documentation

## 2010-12-31 DIAGNOSIS — E0590 Thyrotoxicosis, unspecified without thyrotoxic crisis or storm: Secondary | ICD-10-CM | POA: Insufficient documentation

## 2010-12-31 DIAGNOSIS — R5381 Other malaise: Secondary | ICD-10-CM | POA: Insufficient documentation

## 2010-12-31 LAB — LIPASE, BLOOD: Lipase: 23 U/L (ref 11–59)

## 2010-12-31 LAB — BASIC METABOLIC PANEL
Calcium: 8.9 mg/dL (ref 8.4–10.5)
Creatinine, Ser: 0.77 mg/dL (ref 0.4–1.2)
GFR calc Af Amer: >60 mL/min (ref >60)
GFR calc non Af Amer: >60 mL/min (ref >60)
Glucose, Bld: 113 mg/dL — ABNORMAL HIGH (ref 70–99)
Sodium: 136 mEq/L (ref 135–145)

## 2010-12-31 LAB — URINALYSIS, ROUTINE W REFLEX MICROSCOPIC
Bilirubin Urine: NEGATIVE
Nitrite: NEGATIVE
Protein, ur: NEGATIVE mg/dL
Specific Gravity, Urine: 1.011 (ref 1.005–1.030)
Urobilinogen, UA: 0.2 mg/dL (ref 0.0–1.0)

## 2010-12-31 LAB — DIFFERENTIAL
Basophils Absolute: 0.1 10*3/uL (ref 0.0–0.1)
Eosinophils Absolute: 0.2 10*3/uL (ref 0.0–0.7)
Eosinophils Relative: 1 % (ref 0–5)

## 2010-12-31 LAB — CBC
Platelets: 365 10*3/uL (ref 150–400)
RDW: 13.5 % (ref 11.5–15.5)
WBC: 16 10*3/uL — ABNORMAL HIGH (ref 4.0–10.5)

## 2010-12-31 LAB — HEPATIC FUNCTION PANEL
AST: 23 U/L (ref 0–37)
Bilirubin, Direct: 0.2 mg/dL (ref 0.0–0.3)

## 2011-01-01 NOTE — Consult Note (Signed)
NAMESOPHEE, MCKIMMY NO.:  [id number]  MEDICAL RECORD NO.:  [id number]           PATIENT TYPE:  O  LOCATION:  3739                         FACILITY:  [facility]  PHYSICIAN:  Rollene Rotunda, MD, FACCDATE OF BIRTH:  March 04, 1966  DATE OF CONSULTATION: DATE OF DISCHARGE:                                CONSULTATION   PRIMARY CARE PHYSICIAN:  Madelin Rear. Sherwood Gambler, MD  PRIMARY CARDIOLOGIST:  Bevelyn Buckles. Bensimhon, MD  CHIEF COMPLAINT:  Chest pain.  HISTORY OF PRESENT ILLNESS:  Ms. Yodice is a 45 year old female with a history of nonobstructive coronary artery disease.  She was awakened by chest pain at 4:15 a.m. today.  It was sharp and went through to her lower back.  She also had a significant headache.  She stated she was unable to move secondary to pain.  She had shortness of breath, diaphoresis, and nausea, but no vomiting.  The pain was 9/10.  She took her home pain medication and about 45 minutes later, the pain eased off somewhat and she was able to sleep.  She woke at 6 with 7/10 chest pain.  She took her son to school and the symptoms worsened.  About 8:30 a.m., she was back up to a 9/10.  Eventually, EMS was called and brought her to the emergency room.  The chest pain radiates to her left arm and she is currently complaining of left arm pain, headache, and back pain as well as 7/10 chest pain after a total of 2 mg of Dilaudid.  The pain initially started in her abdomen and then radiated up into her chest. Chest pain has not completely resolved since it began this morning.  PAST MEDICAL HISTORY: 1. Status post cardiac catheterization in 2006, showing nonobstructive     coronary artery disease with an adenosine Myoview. 2. Status post adenosine Cardiolite in May of 2010, that showed     anterior attenuation, mild anterior defect from mid apical level     with a moderate-to-small anteroseptal defect from mid apical level     consistent with soft tissue  attenuation and an EF of 56% with no     wall motion abnormalities and no frank ischemia. 3. History of nonischemic cardiomyopathy with an EF of 40% in 2006,     that it improved to 55% by 2007. 4. History of anxiety. 5. History of gastroparesis. 6. Adrenal insufficiency. 7. Hypothyroidism. 8. Previous history of obesity. 9. Hypertension. 10.Hyperlipidemia. 11.Ongoing tobacco use. 12.Family history of coronary artery disease.  PAST SURGICAL HISTORY:  She is status post cardiac catheterization as well as C-spine surgery, vein stripping, and cholecystectomy.  ALLERGIES:  PENICILLIN and SULFA.  CURRENT MEDICATIONS: 1. Zofran 8 mg q.6 hours p.r.n. 2. Hydrocortisone tablets, 2 tablets every evening and 1 tablet every     evening. 3. Lorazepam 1 mg q.i.d. 4. Metoclopramide 10 mg q.6 hours p.r.n. 5. Protonix 40 mg daily p.r.n. 6. Roxicodone 15 mg 2 tablets q.6 hours. 7. Synthroid 100 mcg daily  SOCIAL HISTORY:  She lives in [place] with her daughter and son.  She is currently unemployed.  She has a  greater than 25 pack year history of ongoing tobacco use, but denies alcohol or drug abuse.  FAMILY HISTORY:  Mother died at 31 with heart disease and heart failure. Her father died at 71 with cancer and at least one sibling has cardiac issues.  REVIEW OF SYSTEMS:  She states she had a fever up to 102 last night. She has chronic arthralgias and significant joint pains especially in her back.  The chest pain is described above.  She is weak.  She has chronic issues with abdominal pain.  She denies melena.  She has problems with anxiety and depression.  She coughs regularly.  Cough has not been productive of any purulent sputum.  Full 14-point review of systems is otherwise negative except as stated in the HPI.  PHYSICAL EXAMINATION:  VITAL SIGNS:  Temperature is 97.4, blood pressure 119/70, pulse 78, respiratory rate 20, O2 saturation 97% on room air. GENERAL:  She is a  well-developed white female in moderate distress with discomfort. HEENT:  Normal. NECK:  There is no lymphadenopathy, thyromegaly, bruit, or JVD noted. CV:  Her heart is regular in rate and rhythm with an S1 and S2 and no significant murmur, rub, or gallop is noted. EXTREMITIES:  Distal pulses are intact in all four extremities. LUNGS:  She has a few crackles that clear with coughing. SKIN:  No rashes or lesions are noted. ABDOMEN:  Soft and diffusely tender with active bowel sounds and no guarding. EXTREMITIES:  There is no cyanosis, clubbing, or edema noted. MUSCULOSKELETAL:  There is no joint deformity or effusions. NEUROLOGIC:  She is alert and oriented.  Cranial nerves II-XII grossly intact.  Chest x-ray no acute disease.  EKG sinus rhythm without any acute ischemic changes.  LABORATORY VALUES:  Hemoglobin 13.7, hematocrit 40.2, WBCs 12.2, platelets 356.  Sodium 140, potassium 4.5, chloride 104, BUN 11, creatinine 0.8, glucose 92.  Point-of-care markers negative x2. Urinalysis negative.  IMPRESSION:  Ms. Balla was seen today by Dr. Antoine Poche, the patient evaluated and the data reviewed.  She is a 45 year old female with multiple medical problems.  She had previously been cathed with nonobstructive disease.  She has had a mildly reduced EF in the past, but her most recent echo showed low normal left ventricular function. She presents with chest and upper epigastric pain.  It is not like her previous chronic GI pain.  However, there is no objective evidence of ischemia.  Her EKG is normal and point-of-care markers are negative. Her pain has not improved with Dilaudid.  PLAN: 1. Chest pain.  She has atypical more so than typical features.  There     is no objective evidence of ischemia.  It is appropriate for her to     be ruled out with serial cardiac enzymes.  She can get a stress     perfusion test, although this would have to be done     pharmacologically as the patient  cannot ambulate that well.  If     this is negative, no further cardiac workup is indicated. 2. Gastroparesis:  This and other issues, we will leave to Internal     Medicine.     Theodore Demark, PA-C   ______________________________ Rollene Rotunda, MD, [facility]    RB/MEDQ  D:  12/01/2010  T:  12/02/2010  Job:  [id number]  Electronically Signed by Theodore Demark PA-C on 12/09/2010 01:09:33 PM Electronically Signed by Rollene Rotunda MD [facility] on 01/01/2011 11:45:02 AM

## 2011-01-06 NOTE — H&P (Signed)
NAME:  [name], [name] NO.:  [id number]  MEDICAL RECORD NO.:  [id number]           PATIENT TYPE:  E  LOCATION:  [facility]                         FACILITY:  [facility]  PHYSICIAN:  Brendia Sacks, MD    DATE OF BIRTH:  [birth year]  DATE OF ADMISSION:  12/01/2010 DATE OF DISCHARGE:                             HISTORY & PHYSICAL   PRIMARY CARE PHYSICIAN:  Madelin Rear. Sherwood Gambler, MD  PRIMARY ENDOCRINOLOGIST:  Dorisann Frames, MD  PRIMARY CARDIOLOGIST:  Bevelyn Buckles. Bensimhon, MD  REFERRING PHYSICIAN:  Rollene Rotunda, MD, [facility] and Dr. Adriana Simas  CHIEF COMPLAINT:  Chest pain.  HISTORY OF PRESENT ILLNESS:  This is a 45 year old woman, who presents with chest pain.  She reports the chest pain began at 4 a.m. this morning.  It was associated with shortness of breath and arm pain.  No diaphoresis.  She describes as a sharp and stabbing quality, characterized as an 8/10.  She reports she continues to have pain.  She was evaluated in the emergency room first by Dr. Antoine Poche, and was recommended observation for rule out and nuclear stress testing.  He requested emergency room physician contact me in regard to medical assistance.  REVIEW OF SYSTEMS:  The patient felt hot at home, but not diaphoretic. She has had chronic visual problems, but nothing acute.  No sore throat. No rash.  No muscle aches.  She reports her chronic back pain and chronic abdominal pain from gastroparesis are stable.  She had some nausea, but no vomiting.  No dysuria.  No bleeding.  PAST MEDICAL HISTORY: 1. Gastroparesis, previously evaluated by Dr. Matthias Hughs and Dr. Adriana Simas at     [facility]. 2. Chronic adrenal insufficiency (Addison's). 3. Hypothyroidism. 4. Diarrhea. 5. Anxiety. 6. Hypertriglyceridemia. 7. History of hypertension, which is no longer the case. 8. History of CHF, most recently echocardiogram with normal left     ventricular ejection fraction.  PAST SURGICAL HISTORY: 1.  Cholecystectomy. 2. Spine surgery. 3. Hysterectomy. 4. Varicose vein surgery.  ALLERGIES:  PENICILLIN, which causes anaphylaxis and SULFA causes itching.  Additional lists of allergies are MORPHINE, DOXYCYCLINE, and ERYTHROMYCIN.  MORPHINE is listed as causing itching.  SOCIAL HISTORY:  Tobacco, smoke 6-7 cigarettes per day.  No alcohol.  No drugs.  FAMILY HISTORY:  Father died of colon cancer.  MEDICATIONS: 1. Synthroid 100 mcg p.o. daily. 2. Roxicodone 15 mg 2 tablets every 6 hours scheduled. 3. Protonix 40 mg p.o. daily. 4. Reglan 10 mg p.o. q.6 h. as needed for nausea or vomiting. 5. Lorazepam 1 mg 4 times daily scheduled. 6. Hydrocortisone 10 mg 2 tablets in the morning and 1 tablet in the     evening. 7. Zofran 8 mg every 6 hours as needed for nausea and vomiting.  PHYSICAL EXAMINATION:  VITAL SIGNS:  Blood pressure 107/69, pulse 78, respirations 20, temperature 97.4, and sat 100%. GENERAL:  Well-developed and well-nourished woman, in no acute distress. She is calm and quiet, lying in bed, appears to be quite comfortable. Pulse is currently in the 70s on my examination and she is normotensive. HEENT:  Head appears  to be normal.  Eyes, sclerae clear.  Pupils are equal, round, and reactive to light with irises and conjunctivae appear unremarkable.  ENT:  Hearing is grossly normal.  Lips, gums, and tongue appear unremarkable. NECK:  Supple.  No lymphadenopathy or masses.  No thyromegaly. CHEST:  Clear to auscultation bilaterally.  No wheezes, rales, or rhonchi.  Normal respiratory effort. CARDIOVASCULAR:  Regular rate and rhythm.  No murmurs, rubs, or gallop. No lower extremity edema. ABDOMEN:  Soft, nontender, and nondistended.  No masses are appreciated. SKIN:  Normal without rash or induration.  It is tender to palpation. PSYCHIATRIC:  Flat affect.  Grossly normal mood.  Speech is fluent and appropriate.  ANCILLARY STUDIES:  EKG independently reviewed shows normal  sinus rhythm with no acute changes.  IMAGING STUDIES:  Chest x-ray, no acute cardiopulmonary disease.  PERTINENT LABORATORY STUDIES: 1. CBC with mild leukocytosis of 12.2; otherwise, unremarkable. 2. I-Stat basic metabolic panel is unremarkable. 3. Cardiac markers are negative x2. 4. Urinalysis is negative.  ASSESSMENT AND PLAN:  A 45 year old woman, who presents with chest pain. 1. Chest pain.  She has already been evaluated by Dr. Antoine Poche in the     emergency room with plans for overnight observation and start     nuclear stress testing in the morning. 2. Chronic back pain.  This appears to be stable. 3. Adrenal insufficiency.  This also appears to be stable. 4. Anxiety.  This appears to be stable.  Continue her Ativan. 5. Chronic back pain.  The patient reports that this is stable.  She     will continue her chronic narcotics. 6. History of gastroparesis.  This appears to be stable.  Continue     Zofran as needed.     Brendia Sacks, MD     DG/MEDQ  D:  12/01/2010  T:  12/01/2010  Job:  [id number]  cc:   Bevelyn Buckles. Bensimhon, MD  Electronically Signed by Brendia Sacks  on 01/06/2011 08:40:08 PM

## 2011-01-19 NOTE — Discharge Summary (Signed)
[name], [name] NO.:  [id number]   MEDICAL RECORD NO.:  [id number]          PATIENT TYPE:  INP   LOCATION:  3738                         FACILITY:  [facility]   PHYSICIAN:  Luis Abed, MD, FACCDATE OF BIRTH:  1966-08-08   DATE OF ADMISSION:  03/14/2007  DATE OF DISCHARGE:                         DISCHARGE SUMMARY - REFERRING   DISCHARGING PHYSICIAN:  Dr. Willa Rough.   DISCHARGE DIAGNOSIS:  1. Noncardiac chest discomfort.  2. Nonobstructive coronary artery disease with normal LV function.  3. Tobacco use.  4. Headache.  5. Hyperlipidemia.  The patient is not on statin therapy.  6. History as listed below.   PROCEDURES PERFORMED:  Cardiac catheterization on 03/15/2007 by Dr.  Riley Kill.   BRIEF HISTORY:  The patient is a 45 year old female, well known to our  practice.  She presented to the emergency room with chest discomfort  that began approximately a week ago.  She has also noticed some shoulder  and neck discomfort.  She went to an Urgent Care and was diagnosed with  cervical strain.  However, the past two days her discomfort has gotten  worse.  She had noticed some dry heaves, decreased provocation with  exertion, shortness of breath, nausea, diaphoresis and lightheadedness.  After coming to the emergency room and receiving morphine and aspirin,  her discomfort was reduced.   PAST MEDICAL HISTORY:  Is notable for dilated cardiomyopathy with  initially an EF of 40% to 50% but improved to 55% to 60% in 02/2006.  Catheterization in 08/2005 showed mild distal myocardial bridging in the  LAD, proximal septal bridging of 80%.  She also has a proximal mid 50%  to 60% RCA lesion.  Her history is notable for hypertension,  hyperlipidemia, hypothyroidism, GERD, quit smoking approximately four  days ago.   PAST SURGICAL HISTORY:  History of venous stripping, cholecystectomy,  TAH, cervical spine surgery.   LABORATORY DATA:  Initial chest x-ray on  03/14/2007 showed no active  disease.   Repeat chest x-ray on 03/16/2007 also did not show any abnormalities.   Admission H and H was 12.8 and 36.8, normal indices, platelets 369,  WBC's 11.1.  Prior to discharge H and H was 11.6 and 33.8, platelets  311, WBC's 8.1.  PTT 35, PT 14.8, D-DIMER less than 0.22.  on admission,  sodium of 136, potassium 04/06, BUN four, creatinine 0.8, glucose 101.  Chemistry on 03/14/2007 shows sodium 137, potassium 3.4, BUN 17,  creatinine 0.8, glucose 101, normal LFT's.  Prior to discharge sodium  was 141, potassium 3.6, BUN 12, creatinine 0.69, glucose 83.  CK-MB and  troponins were within normal limits x 3.  BNP less than 30.  Lipids  showed total cholesterol 190, triglycerides 230, HDL 25, LDL 119.  TSH  was 0.558.   EKG showed normal sinus rhythm, early R-waves, normal axis,  insignificant inferior lateral Q-waves.  No acute changes.   HOSPITAL COURSE:  The patient was admitted to [facility] by  Tereso Newcomer and Dr. Jens Som she was placed on her home medications as  well as Lovenox.  The patient  complained of headache and the patient  requested Vicodin which Dr. Myrtis Ser ordered.  Tobacco cessation consult was  performed.  Cardiac catheterization was performed on 03/15/2007 by Dr.  Riley Kill to further evaluate her discomfort.  She did have some basilar  inferior hypokinesis.  She had a 50% to 60% proximal RCA lesion and a  50% to 60% diagonal lesion.  The EF was 55% to 60% with inferior  hypokinesis.  Dr. Riley Kill felt that her disease was unchanged and  advised to discontinue smoking.  He also checked a D-DIMER and repeat  chest x-ray.  All these were within normal limits and after review on  03/16/2007 Dr. Myrtis Ser felt that the patient could be discharged home.   DISPOSITION:  The patient's wound care and activities are dictated per  supplemental discharge sheet.  She was asked to avoid smoking and  tobacco products.  To bring all medications to  all follow-up  appointments.   MEDICATIONS:  1. Coreg-CR 40 mg daily.  2. Lisinopril 20 mg daily.  3. Synthroid 0.1 mg daily.  4. Protonix 40 mg daily.  5. HCTZ 25 mg daily.  6. Baby aspirin 81 mg daily.  7. She was started on Lipitor 40 mg at bedtime given her      hyperlipidemia.  She will need blood work with her primary care physician in  approximately 6 to 8 weeks given initiation of her Lipitor.  She is  asked to make follow-up appointments with Dr. Lenord Fellers, her primary care  physician, and to follow up with Dr. Gala Romney as needed.   DISCHARGE TIME:  35 minutes.      Joellyn Rued, PA-C      Luis Abed, MD, [facility]  Electronically Signed    EW/MEDQ  D:  03/16/2007  T:  03/16/2007  Job:  [id number]   cc:   Luanna Cole. Lenord Fellers, M.D.  Bevelyn Buckles. Bensimhon, MD

## 2011-01-19 NOTE — Cardiovascular Report (Signed)
[name], [name] NO.:  [id number]   MEDICAL RECORD NO.:  [id number]          [name] TYPE:  INP   LOCATION:  3738                         FACILITY:  [facility]   PHYSICIAN:  Arturo Morton. Riley Kill, MD, FACCDATE OF BIRTH:  12-23-1965   DATE OF PROCEDURE:  03/15/2007  DATE OF DISCHARGE:                            CARDIAC CATHETERIZATION   INDICATIONS:  [name] is a 45 year old who presents with some chest  pain.  The current study was done to assess coronary anatomy.  [name]  previously underwent cardiac catheterization which demonstrated a  subtotal septal perforator and also moderate right coronary stenosis.  The current study was done because of recurrent episode of substernal  chest pain.  [name] has also had a cough for 48 hours that has gotten  slightly worse.   PROCEDURE:  1. Left heart catheterization  2. Selective coronary arteriography.  3. Selective left ventriculography.  4. Intracoronary administration of nitroglycerin.   DESCRIPTION OF PROCEDURE:  The [name] was brought to the  catheterization laboratory and prepped and draped in usual fashion.  2  mg of intravenous Versed was given for relaxation.   An anterior puncture was used, and the right femoral artery was easily  entered where a 5-French sheath was placed.  Following this, views of  the left and right coronaries were obtained in multiple angiographic  projections.  Intracoronary nitroglycerin was administered in both  arteries and images obtained before and after.  Central aortic and left  ventricular pressures were measured with a pigtail, and ventriculography  was done in the RAO projection.  Following a pressure pullback, the  pigtail catheter was removed.  I then spoke with the [name], reviewed  the films with Joy, and then discussed the case with [name].   [name] was in satisfactory clinical condition without complication.   HEMODYNAMIC DATA:  1. Central aortic pressure 137/74, mean  105.  2. Left ventricular pressure 125/70.  3. No gradient pullback across the aortic valve.   ANGIOGRAPHIC DATA.:  1. Ventriculography was done in the right anterior oblique projection.      Overall systolic function is preserved, with an ejection fraction      estimate of 55-60%.  Importantly, there is mild inferobasal      hypokinesis.  2. The left main is free of critical disease.  3. The LAD courses to the apex and provides a major diagonal branch.      The LAD and diagonal both appear to be free of critical narrowing.      There is a septal perforator with about 50-60% ostial narrowing,      and it looks slightly less severe than on the previous study.  4. The circumflex provides bifurcating marginal and an AV branch which      goes distally, and both of these vessels appear without critical      narrowing.  5. The right coronary artery has about 50-60% narrowing in the      proximal to midsegment.  It is slightly worse than on the previous      study.  It  does, however, appear to be smooth, and the minimum      lumen diameter appears to be more than 2 mm throughout.  There is      also not haziness or evidence of disrupted edges.  The distal      vessel consists of a posterior descending and posterolateral      system.  No critical narrowing in these areas are noted as well.   IMPRESSION:  1. Preserved overall left ventricular function with a very mild      inferior wall motion abnormality.  2. Segmental stenosis of the right coronary artery of about 50-60%,      with findings as noted above.  3. Abnormality of the septal perforator, previously noted, but appears      less severe than on the previous study.   DISPOSITION:  The [name] has been a smoker, whether or not this has to  do with transient vasoconstriction of the mid-right coronary artery is  unclear.  The current study does not suggest high-grade disease.  Alternatively, [name] could have had transient closure in  this area.  If  [name] continues to have symptoms, then intravascular ultrasound with  possible percutaneous stenting of the right coronary artery will be  warranted or will be appropriate to consider.  At the present time,  discontinuation of smoking in the addition of low-dose nitrates would be  appropriate.  Also the addition of antiplatelet agents would be  appropriate.  The [name] does have a cough for 48 hours, and we are  going to get a D-dimer and also check a repeat chest x-ray.  [name] will  need followup with Dr. Gala Romney.      Arturo Morton. Riley Kill, MD, [facility]  Electronically Signed     TDS/MEDQ  D:  03/15/2007  T:  03/16/2007  Job:  [id number]   cc:   Bevelyn Buckles. Bensimhon, MD  Luanna Cole. Lenord Fellers, M.D.  [facility]

## 2011-01-19 NOTE — H&P (Signed)
[name], [name] NO.:  [id number]   MEDICAL RECORD NO.:  [id number]          PATIENT TYPE:  INP   LOCATION:  3738                         FACILITY:  [facility]   PHYSICIAN:  Madolyn Frieze. Jens Som, MD, FACCDATE OF BIRTH:  1965/11/15   DATE OF ADMISSION:  03/14/2007  DATE OF DISCHARGE:                              HISTORY & PHYSICAL   PRIMARY CARDIOLOGIST:  Dr. Arvilla Meres.   PRIMARY CARE PHYSICIAN:  Dr. Eden Emms Baxley.   CHIEF COMPLAINT:  Chest pain.   HISTORY OF PRESENT ILLNESS:  [name] is a 45 year old female patient  with a history of nonobstructive coronary disease and nonischemic  cardiomyopathy that has resolved, followed by Dr. Gala Romney, who  presents to the emergency room today with complaints of chest  discomfort.  She was in her usual state of health until about a week  ago.  She began to note some shoulder pain and neck pain; this was  associated with some mild chest discomfort from time to time.  She went  to an Urgent Care and was diagnosed with cervical strain.  She was  evaluated with an electrocardiogram per her report that was normal.  She  is apparently told to follow up with her cardiologist.  Her chest pain  got worse and she called the office yesterday and was told to go to the  emergency room, but did not go.  Over the last 2 days, she has noted  worsening chest discomfort that she describes as a tightness.  This is  usually with exertion.  Over the last 48 hours, it has progressed to  with just minimal activity.  She works at [employer] and with just  walking out to the car, she has been getting chest discomfort.  She  notes shortness of breath associated as well as nausea, diaphoresis and  left arm pain.  She fell lightheaded today.  Of note, she does work in a  very hot environment at [employer].  This afternoon, she was  feeling much worse and eventually decided to go ahead and come to the  emergency room.  Earlier  today, her pain was about an 8/10.  Currently,  it is 2/10 after morphine x1 and aspirin.   PAST MEDICAL HISTORY:  1. Nonobstructive coronary artery disease by cardiac catheterization      in 2006.      a.     Result at that time revealed 50% to 60% proximal mid RCA       lesion as well as intramyocardial bridging of the LAD.  She also       had septal branch from the LAD that had an 80% ostial lesion, but       this was a very small vessel at 1 mm.  2  Nonischemic cardiomyopathy with previous EF of 40% to 50%.  a.  Echocardiogram in 2007 revealed an EF of 55% to 60%.  1. Hypertension.  2. Untreated dyslipidemia.  3. Treated hypothyroidism.  4. Gastroesophageal disease.  5. History of hysterectomy.  6. History of cervical spine surgery.  7.  Venous insufficiency with previous history of vein stripping.   MEDICATIONS:  1. Coreg CR 40 mg a day.  2. Lisinopril 20 mg day.  3. Synthroid 0.1 mg a day.  4. Protonix 40 mg daily.  5. Hydrochlorothiazide 25 mg a day.  6. Of note, she was on Lipitor in the past; this was stopped for      unknown reason.  7. She was on Imdur in the past as well and this was stopped secondary      to headache.   ALLERGIES:  PENICILLIN and SULFA.   SOCIAL HISTORY:  She lives in [place] with her husband and 3  children.  She is an ex-smoker; she quit about 4 days ago.  She smoked  off and on for the last 25 years of 1 pack per day.  She denies alcohol  abuse.  She is an International aid/development worker at [employer].   FAMILY HISTORY:  Significant for coronary artery disease.  Mother died  of congestive heart failure and a myocardial infarction at age 38.  Her  father died at age 47 with colon cancer.  She has a brother with  questionable heart problems.   REVIEW OF SYSTEMS:  Please see HPI.  She denies fevers, but has had some  chills.  Denies weight gain.  Denies sore throat, but has had a  headache, especially today.  She notes a lesion on her left forearm  that  she is concerned may be skin cancer and is to see Dermatology soon.  She  notes paroxysmal nocturnal dyspnea last night; this was associated with  chest discomfort and cough.  She denies any hemoptysis.  She has had  lower extremity edema recently.  She was to see a vein specialist  tomorrow for her history of venous insufficiency.  She had been fairly  weak recently; this is fairly global.  She notes bilateral extremity  discomfort with walking; this is questionable for claudication, although  it is somewhat nonspecific.  She notes myalgias as well.  She notes  occasional water brash symptoms as well as belching.  Denies any  dysphagia or odynophagia.  Denies any bright red blood per rectum.  She  had some dark stools a couple of weeks ago, but her stools are normal  now.  She denies any true melena.  Denies dysuria or hematuria.  The  rest of the review of systems is negative.   PHYSICAL EXAM:  GENERAL:  She is a well-nourished and well-developed  female.  VITAL SIGNS:  Blood pressure 108/57, pulse 68, respirations is 18,  temperature 97.8 and oxygen saturation 99% on room air.  HEENT:  Normal.  NECK:  Without JVD.  Carotid bruits bilaterally.  LYMPHATICS:  Without lymphadenopathy.  ENDOCRINE:  Without thyromegaly.  CARDIAC:  Normal S1 and S2, regular rate and rhythm without murmurs.  LUNGS:  Clear to auscultation bilaterally with no rhonchi or rales.  ABDOMEN:  Soft and nontender with normoactive bowel sounds and no  organomegaly.  EXTREMITIES:  Without clubbing, cyanosis or edema.  MUSCULOSKELETAL:  Without joint deformity.  SKIN:  She has approximately a 3-mm-in-diameter plaque on the left  forearm.  NEUROLOGIC:  Alert and oriented x3.  Cranial nerves II-XII grossly  intact.   CHEST X-RAY:  No acute disease.   EKG:  Sinus rhythm with a heart rate of 70, normal axis, no acute  changes.   LABORATORY DATA:  Hemoglobin 12.8, hematocrit 36.8, white count 11,100,  platelet  count 369,000.  Sodium 136, potassium 4.6, BUN 4, creatinine  0.8, glucose 101.  Point-of-care markers negative x1.  BNP less than 30.   IMPRESSION:  1. Chest discomfort concerning for unstable angina pectoris.  2. Nonobstructive coronary artery disease by catheterization in 2006.      a.     Fifty to sixty percent proximal and mid right coronary       artery lesion.      b.     Mid to distal left anterior descending myocardial bridging.      c.     A proximal septal branch of the left anterior descending       with 80% ostial lesion -- small vessel (1 mm).  3. Non-ischemic cardiomyopathy --resolved.      a.     Ejection fraction previously 40% to 50%, now 55% to 60% by       echocardiogram in 2007.  4. Hypertension.  5. Untreated dyslipidemia.  6. Treated hypothyroidism.  7. Tobacco abuse.      a.     Recently quit.  8. Family history of Coronary disease.  9. Gastroesophageal reflux disease.  10.Left forearm lesion.   PLAN:  The patient is also going to be examined by Dr. Olga Millers  today.  Her symptoms are concerning for unstable angina pectoris.  With  her risk factors and findings on previous catheterization, we  recommended proceeding with cardiac catheterization to further define  her symptoms.  Risks and benefits have been discussed with the patient  and she agrees to proceed.  We will admit her overnight and treated her  with Lovenox and aspirin.  We will continue her beta blocker and ACE  inhibitor.  We will try to restart her on a statin.  We will plan on  cardiac catheterization tomorrow.      Tereso Newcomer, PA-C      Madolyn Frieze. Jens Som, MD, [facility]  Electronically Signed    SW/MEDQ  D:  03/14/2007  T:  03/15/2007  Job:  [id number]   cc:   Luanna Cole. Lenord Fellers, M.D.

## 2011-01-19 NOTE — Assessment & Plan Note (Signed)
[facility]                            CARDIOLOGY OFFICE NOTE   NAME:[name], [name]                       MRN:          [id number]  DATE:09/04/2007                            DOB:          March 19, 1966    PRIMARY CARE PHYSICIAN:  Bevelyn Buckles. Bensimhon, MD   PRIMARY CARE PHYSICIAN:  Luanna Cole. Lenord Fellers, M.D.   PATIENT PROFILE:  A 45 year old Caucasian female with a history of non-  ischemic cardiomyopathy with now normalized LV function who presents for  followup on:  1. History of chest pain.      a.     Cardiac catheterization December 2006, revealing mild non-       obstructive disease with an ejection fraction of 55-60% and a mild       cardial bridge in the left anterior descending artery.      b.     History of negative CT of the chest for PE in November 2006.      c.     March 15, 2007, cardiac catheterization with ejection fraction       of 55-60% with mild inferobasilar hypokinesis. Left main was       normal. Left anterior descending artery was normal, diagonal was       normal, septal perforator at 50-60% ostial narrowing. Left       circumflex and obtuse marginals were normal. Right coronary artery       had a 50-60% narrowing in the proximal segment. The patient was       medically managed.  2. History of non-ischemic cardiomyopathy.      a.     Echocardiogram in June 2007, ejection fraction of 55-60%,       mild diastolic dysfunction, mildly dilated left ventricular. No       significant valvular disease.      b.     Ejection fraction of 55-60% with mild inferobasilar       hypokinesis by left ventricular gram on cath July 2008.  3. Hypertension.  4. Hypothyroidism.  5. Remote tobacco abuse.  6. Obesity.  7. Hyperlipidemia with hypertriglyceridemia.   HISTORY OF PRESENT ILLNESS:  A 45 year old Caucasian female with the  above problem list. She presents today for routine followup. She last  saw Dr. Gala Romney in April of 2008 and in July 2008  she had an episode  of chest discomfort for which she was admitted to [facility] and underwent  cardiac catheterization revealing non-obstructive disease and normal LV  function. Since then, she has done remarkably well and has remained  active both at work and at home walking about a mile and a half a day  without any chest pain, shortness of breath or limitations in  activities. She denies any PND, orthopnea, dizziness, syncope, edema or  early satiety.   HOME MEDICATIONS:  1. Synthroid 100 mcg daily.  2. Coreg 40 mg daily.  3. Aspirin 81 mg daily.  4. Hydrochlorothiazide 25 mg daily.  5. Protonix 20 mg daily.  6. Lisinopril 20 mg daily.   PHYSICAL EXAMINATION:  Blood pressure  100/71, heart rate 78,  respirations 16. Her weight is 191 pounds. Pleasant white female in no  acute distress. Awake, oriented x3.  NECK: No bruits or JVD.  LUNGS:  Respirations regular and unlabored, clear to auscultation.  CARDIAC: Regular S1, S2. No S3, S4 or murmurs.  ABDOMEN: Round, soft and nontender. Nondistended. Bowel sounds present  x4.  EXTREMITIES: Warm, dry and pink. No clubbing, cyanosis or edema.  Dorsalis pedis, posterior tibial pulses 2+ and equal bilaterally.   CLINICAL FINDINGS:  EKG shows sinus rhythm. No acute ST-T wave changes.   ASSESSMENT/PLAN:  1. History of chest pain. The patient has non-obstructive disease by      catheterization in July of 2008 and has overall been doing well. We      will make no changes to her medical regimen today which includes      aspirin, beta-blocker and ACE inhibitor therapy. She had previously      been on Lipitor. Has been discontinued by Dr. Lenord Fellers. Would      consider re-initiation of an alternate statin if this has not been      tolerated in the past, will defer to Dr. Lenord Fellers.  2. Hypertension, stable. Continue current regimen. The patient needs      her lisinopril and hydrochlorothiazide refilled. I printed up a [facility] for three  month list since she uses [facility] and      she apparently can obtain pretty good cost savings by using this      plan rather than by paying through her prescription plan which she      is fearful she will lose after her divorce is settled with her      husband.  3. History of non-ischemic cardiomyopathy. She is euvolemic on      examination and remains on Coreg and lisinopril therapy. If she      loses her prescription coverage, we would go ahead and switch her      back to b.i.d. Coreg which would be available less expensively for      her.  4. Hypothyroidism, on Synthroid and followup with Dr. Lenord Fellers.  5. Disposition. Followup with Dr. Gala Romney in 3-6 months.      Nicolasa Ducking, ANP  Electronically Signed      Jesse Sans. Daleen Squibb, MD, [facility]  Electronically Signed   CB/MedQ  DD: 09/04/2007  DT: 09/04/2007  Job #: [id number]

## 2011-01-21 ENCOUNTER — Emergency Department (HOSPITAL_COMMUNITY)
Admission: EM | Admit: 2011-01-21 | Discharge: 2011-01-21 | Disposition: A | Discharge status: Home / Self Care | Payer: BC Managed Care – PPO | Attending: Emergency Medicine | Admitting: Emergency Medicine

## 2011-01-21 DIAGNOSIS — E2749 Other adrenocortical insufficiency: Secondary | ICD-10-CM | POA: Insufficient documentation

## 2011-01-21 DIAGNOSIS — T1500XA Foreign body in cornea, unspecified eye, initial encounter: Secondary | ICD-10-CM | POA: Insufficient documentation

## 2011-01-21 DIAGNOSIS — K3184 Gastroparesis: Secondary | ICD-10-CM | POA: Insufficient documentation

## 2011-01-21 DIAGNOSIS — IMO0002 Reserved for concepts with insufficient information to code with codable children: Secondary | ICD-10-CM | POA: Insufficient documentation

## 2011-01-21 DIAGNOSIS — E039 Hypothyroidism, unspecified: Secondary | ICD-10-CM | POA: Insufficient documentation

## 2011-01-22 NOTE — Op Note (Signed)
[place]  Patient:    [name], [name]                       MRN: [id number] Proc. Date: 11/28/00 Adm. Date:  [id number] Attending:  Barton Fanny                           Operative Report  PREOPERATIVE DIAGNOSIS:  C5-6 and C6-7 degenerative disk disease, spondylosis and cervical disk herniation with resulting radiculopathy.  POSTOPERATIVE DIAGNOSIS:  C5-6 and C6-7 degenerative disk disease, spondylosis and cervical disk herniation with resulting radiculopathy.  OPERATION PERFORMED:  C5-6 and C6-7 anterior cervical diskectomy and arthrodesis with iliac crest allograft and Theken cervical plating.  SURGEON:  Hewitt Shorts, M.D.  ASSISTANT:  Mena Goes. Franky Macho, M.D.  ANESTHESIA:  General endotracheal.  INDICATIONS FOR PROCEDURE:  The patient is a 45 year old woman who presented with a right cervical radiculopathy and was found to have spondylitic degenerative changes and degenerative disk disease with superimposed disk herniations.  Decision was made to proceed with two level anterior cervical diskectomy and arthrodesis.  DESCRIPTION OF PROCEDURE:  The patient was brought to the operating room and placed under general endotracheal anesthesia.  The patient was placed on 10 pounds of halter traction and then the neck was prepped with Betadine soap and solution and draped in a sterile fashion.  The incision was made on the left side of the neck.  The line of incision was made with a Shaw scalpel at a temperature of 120.  Prior to incision, the skin was infiltrated with local anesthetic with epinephrine.  Dissection was carried down through subcutaneous tissues and platysma and then dissection was carried down through an avascular plane leaving the sternocleidomastoid, carotid artery and jugular vein laterally and trachea and esophagus medially.  The ventral aspect of the vertebral column was identified and localizing x-ray taken at  C5-6 and C6-7 intervertebral disk spaces identified.  Diskectomy was begun with incision of the annulus and continued with microcurets and pituitary rongeurs.  The cartilaginous end plates and the corresponding vertebral body surfaces were removed using microcurets and the Micromax drill.  The microscope was draped and brought into the field to provide additional magnification, illumination and visualization.  The remainder of the procedure was performed using microdissection technique.  Spondylitic overgrowth posteriorly was removed with the Micromax drill and the 2 mm Kerrison punch with a thin foot plate. Foraminotomy was performed bilaterally with removal of hypertrophic spur from the uncinate processes bilaterally and in the end, good decompression was achieved of the C6 and C7 nerve roots bilaterally.  In other words, there was particular disk herniation behind the posterior longitudinal ligament at the C5-6 level and also a large hypertrophic spur from the right C6 uncinate process.  Once decompression was completed, we established hemostasis with the use of Gelfoam soaked in thrombin and then proceeded with the arthrodesis.  We selected to wedges of iliac crest allograft that were cut and shaped and sized and positioned at each of the intervertebral disk spaces.  We then selected a 38 mm Theken cervical plate.  It was positioned over the fusion construct and secured to the vertebra with a pair of screws in C5 and C7 and a single screw at C6.  We used 4.0 x 13 mm screws.  Each of the screw holes was started with an awl and then the screw placed.  Once  all five screws were placed, we tighitened them and did final tightening and then irrigated the wound with bacitracin solution and checked for hemostasis.  Traction was discontinued. Hemostasis was established and confirmed.  Then we proceeded with closure. The platysma was closed with interrupted inverted 2-0 undyed Vicryl sutures. The  subcuticular layer was closed with interrupted inverted 3-0 undyed Vicryl sutures and the skin edges were approximated with Dermabond.  The patient tolerated the procedure well.  Estimated blood loss was 50 cc.  Sponge and instrument counts were correct.  Following surgery the patient was reversed from anesthetic, extubated and transferred to the recovery room where she was noted to be moving all four extremities. DD:  11/28/00 TD:  11/28/00 Job: [id number]

## 2011-01-22 NOTE — Cardiovascular Report (Signed)
NAME:  [name], [name] NO.:  [id number]   MEDICAL RECORD NO.:  [id number]          PATIENT TYPE:  OIB   LOCATION:  1961                         FACILITY:  [facility]   PHYSICIAN:  Arvilla Meres, M.D. LHCDATE OF BIRTH:  Nov 09, 1965   DATE OF PROCEDURE:  09/03/2005  DATE OF DISCHARGE:  09/03/2005                              CARDIAC CATHETERIZATION   PRIMARY CARE PHYSICIAN:  Dr. Eden Emms Baxley   CARDIOLOGIST:  Dr. Gala Romney   PATIENT IDENTIFICATION:  [name] is a 45 year old woman with multiple  risk factors who has been experiencing chronic chest pain.  This has been  slightly worse recently.  She underwent a Cardiolite which showed  possibility of some mild anterior ischemia versus breast artifact.  She is  thus referred for cardiac catheterization in our outpatient [facility] laboratory.   PROCEDURES PERFORMED:  1.  Selective coronary angiography.  2.  Left heart catheterization.  3.  Left ventriculogram.   DESCRIPTION OF PROCEDURE:  The risks and benefits of catheterization were  explained.  Consent was signed and placed on the chart.  4-French sheath was  placed in the right femoral artery using a modified Seldinger technique.  Standard preformed catheters including a Judkins JL4, JR4 angled pigtail  were used.  All catheter exchanges were made over a wire.  There were no  apparent complications.  Central aortic pressure was 88/50 LV pressure was  __________ with an LVEDP of 6.  There was no gradient across the aortic  valve on pullback.   Left main was normal.   LAD was a long vessel that wrapped the apex.  It gave off a large diagonal  at the mid section.  There was also a moderate-sized septal branch  proximally.  In the mid to distal LAD there was a small intramyocardial  segment without any diastolic flow limitation  In the proximal septal branch  there was 80% otial lesion but this was 1 mm vessel   Left circumflex was a large vessel gave off a large  OM1, small OM2, small  OM3.  There was no CAD.   Right coronary artery was a dominant vessel.  Gave off a moderate-sized PA  and a large PL.  In the proximal to mid section there was a long 50-60%  stenosis prior to the takeoff of the RV branch.  This did not appear flow-  limiting.   Left ventriculogram shot in the RAO approach showed a left ventricular  ejection fraction estimated at 55-60% with very mild anterior hypokinesis.  There was no significant mitral regurgitation.   ASSESSMENT:  1.  Moderate non-obstructive coronary disease.  2.  Muscle bridge in the mid to distal left anterior descending without      diastolic flow limitation.  3.  Normal ejection fraction with very mild anterior hypokinesis of unclear      etiology.   PLAN:  Continue aggressive medical therapy including smoking cessation and  statin therapy.      Arvilla Meres, M.D. [facility]  Electronically Signed     DB/MEDQ  D:  09/03/2005  T:  09/03/2005  Job:  [id number]   cc:   Luanna Cole. Lenord Fellers, M.D.  Fax: [phone]

## 2011-01-22 NOTE — Op Note (Signed)
[place]  Patient:    [name]                        MRN: [id number] Proc. Date: 09/17/99 Adm. Date:  [id number] Attending:  Charlton Haws CC:         Jamesetta Geralds, M.D., [facility]                           Operative Report  PREOPERATIVE DIAGNOSIS:  Cholecystitis and severe biliary colic.  POSTOPERATIVE DIAGNOSIS:  Cholecystitis and severe biliary colic.  OPERATION:  Laparoscopic cholecystectomy.  SURGEON:  Currie Paris, M.D.  ASSISTANTRiley Lam A. Magnus Ivan, M.D.  ANESTHESIA:  General endotracheal.  CLINICAL HISTORY:  This patient is a 45 year old, who presented with intermittent abdominal pain, which has now gotten worse.  It is really in the right upper quadrant right at the rib cage edge anteriorly on the right side and has become  progressively worse and associated with some nausea and vomiting and occasional  vomiting.  She has recently also been treated for a URI, bronchitis symptoms, which actually those have improved, but the pain has gotten worse.  She was seen by Dr. Tery Sanfilippo and gallbladder ultrasound today, which showed stones and some adenomyosis of the gallbladder without gallbladder wall thickening.  White cell count was slightly elevated to 11,000.  She was afebrile.  She had some mild right upper quadrant tenderness.  DESCRIPTION OF PROCEDURE:  Patient brought to the operating room and after satisfactory general endotracheal anesthesia had been obtained, the abdomen was  prepped and draped.  Marcaine 0.25% was used at each incision site.  An umbilical incision was made, the fascia opened and peritoneal cavity entered.  Hasson was  introduced and the abdomen insufflated to 15.  With the camera, we did not see ny gross abnormalities initially.  The patient was placed in reverse Trendelenburg  position and tilted left and three additional cannulae placed under  direct vision in the usual positions.  The gallbladder was tense and distended, but not acutely inflamed.  Dissection was begun around the Tri[place] and I was able to identify the cystic artery and trace it coming up out of the gallbladder and I divided it after clipping it with two clips on the stay side.  I then opened up the peritoneum on either side, so I could get a good window into the Tri[place] and easily see the cystic duct in its entirety.  It was clipped three times on he stay side and one ______  and divided.  The gallbladder was removed from below o above and was primarily intrahepatic and there was a lot of oozing from the liver bed as we took it out, but came out satisfactorily.  One small vessel in the liver bed was clipped.  Once the gallbladder was out, we spent several minutes irrigating liver bed and cauterizing to make sure everything was dry.  The gallbladder was  then removed out through the umbilical port and Hasson.  To do so, we had to decompress the gallbladder because it was too tense and distended with bile to ome out through the umbilical incision.  The Hasson was then reintroduced and we again aspirated, irrigated and checked for hemostasis.  Once everything was dry, had eft some Surgicel along the bed in case there was any delayed oozing.  Remaining  irrigant was suctioned out.  The lateral ports were removed and appeared to be ry.  The umbilical port was removed and the purse-string tied down and this was checked with the camera in the epigastric port.  The abdomen was desufflated through the epigastric port.  Skin was closed with a 4-0 Monocryl subcuticular, plus Steri-Strips.  The patient tolerated the procedure well.  There were no operative complications.  All counts were correct. DD:  09/17/99 TD:  09/17/99 Job: [id number]

## 2011-01-22 NOTE — H&P (Signed)
[name], [name]                ACCOUNT NO.:  [id number]   MEDICAL RECORD NO.:  [id number]          PATIENT TYPE:  INP   LOCATION:  1823                         FACILITY:  [facility]   PHYSICIAN:  Mobolaji B. Bakare, M.D.DATE OF BIRTH:  1966/08/22   DATE OF ADMISSION:  07/15/2005  DATE OF DISCHARGE:                                HISTORY & PHYSICAL   CHIEF COMPLAINT:  Chest pain for 2 days.   HISTORY OF PRESENT ILLNESS:  [name] is a pleasant 45 year old Caucasian  female with history of hypertension and hypothyroidism. She is presenting  with retrosternal chest pain, which has occurred twice in the last 24 hours.  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 She  had retrosternal chest pain associated with shortness of breath radiating to  her back. There was no nausea, vomiting, or progressive palpitation. She  used aspirin and the pain went away. Again this morning while pushing a  wheelchair, she experienced similar retrosternal chest pain. This time it  was accompanied with diaphoresis. She has been having a cough for over 5  weeks. She was treated for acute bronchitis prior to total abdominal  hysterectomy, which she had on June 03, 2005. Two weeks after surgery,  she had pneumonia, which was diagnosed at urgent care and she was placed on  antibiotics. Since then the cough has not gone away and she has been  experiencing pleuritic chest pain while coughing and also on movement. But  this pleuritic chest pain is localized to under her left breast. This gets  aggravated on body movement and has been getting worse in the last 1 week.  She definitely distinguished this pleuritic chest pain under the left breast  from the retrosternal chest pain, which is new for her. She denies any pain  in her calf. There is no fever. Cough is productive of whitish sputum and it  is mainly in her throat. She has nasal congestion but has not been  experiencing post  nasal drip as of now. Does have history of post nasal drip  in the past. She sometimes experiences wheezing at night and cough is more  pronounced at night. The patient also stated that the retrosternal chest  pain radiates to the back and to the neck. She felt dizzy but no syncope.   On arrival in the emergency department, she was given nitroglycerin, which  relieved the retrosternal chest pain and currently, she has a dull achy  retrosternal chest pain, rated as 1 over 10.   REVIEW OF SYSTEMS:  There is no orthopnea or PND. No pedal edema. No  abdominal pain, constipation, diarrhea. No headaches. No weight loss. She  denies any heartburn. She denies dysuria, urgency, or increased frequency of  micturition.   PAST MEDICAL HISTORY:  1.  Mitral valve prolapse.  2.  Hypertension.  3.  Recent pneumonia postoperatively.   PAST SURGICAL HISTORY:  1.  Hysterectomy 6 weeks ago.  2.  Cervical surgery.  3.  Cholecystectomy.   CURRENT MEDICATIONS:  Benicar 20 mg p.o. q.d., verapamil 120 mg p.o. q.h.s.,  Synthroid 100 mcg p.o. q.d.   ALLERGIES:  PENICILLIN (gives her hives and throat swelling.) SULFA (gives  her hives.)   FAMILY HISTORY:  Significant for colon cancer in her father who died at the  age of 18 from colon cancer. Mother died at the age of 15 from complications  of congestive heart failure. She had diabetes mellitus and myocardial  infarction. Grandfather had heart attack and stroke.   SOCIAL HISTORY:  She is married and has 3 children. She has been smoking for  18 to 20 years, 1 to 1 and 1/2 packs per day. She quit 1 year ago. No  significant alcohol consumption.   PHYSICAL EXAMINATION:  VITAL SIGNS:  Temperature 98.6, blood pressure  108/58, pulse rate 118 initially and now between 70 and 80. Respiratory rate  of 18. O2 saturations are 100% on oxygen.  GENERAL:  The patient is alert, awake, oriented to person, place, and time.  No acute respiratory distress.  HEENT:   Normocephalic and atraumatic. Head, pupils are equal, round, and  reactive to light. Extraocular muscles intact. Oropharynx not hyperemic. No  enlarged tonsils. Nose, she has inflamed turbinates. Ears, no abnormality  noted.  LUNGS:  Clear clinically. No wheeze, no rhonchi, no crackles.  CARDIOVASCULAR:  S1 and S2 regular. No murmur. No gallop and no rub.  ABDOMEN:  Not distended. Soft and nontender. Incision site looks clean, dry,  and healing. Bowel sounds present. No palpable organomegaly.  EXTREMITIES:  No pedal edema. No calf tenderness. Homan's sign negative. No  cyanosis.  NEUROLOGIC:  No focal neurological deficits. Cranial nerves intact.  SKIN:  No rash, no petechiae.  MUSCULOSKELETAL:  There is significant tenderness on palpation over left rib  cage but no tenderness on palpation over the sternum.   LABORATORY DATA:  Initial laboratory data sodium 138, potassium 3.3,  chloride 100, bicarb 29, glucose 83, BUN 12, creatinine 0.8, calcium 8.9.  White cell 11.4. Hemoglobin and hematocrit 12.4 and 36. Platelets 513,000.  Neutrophils 57%, lymphocytes 34%. Cardiac markers at the point of care:  myoglobin 45, troponin less than 0.5. Second set, myoglobin 28, troponin  less than 0.05. D-dimer less than 0.22.   RADIOLOGIC DATA:  Chest x-ray, no acute abnormalities.   EKG:  Normal sinus rhythm. No acute ST changes. Normal interval. Heart rate  of 85 beats per minute.   ASSESSMENT/PLAN:  [name] is a 45 year old Caucasian female presenting  with retrosternal chest pain, pleuritic chest pain underneath her breast,  cough, and nasal congestion. Two sets of cardiac enzymes at the point were  negative. She has a normal EKG and a normal chest x-ray.   1.  CHEST PAIN: She has a component of musculoskeletal pain from repeated      coughing. Will obtain a chest CT scan to rule out dissection and     pulmonary embolism. Obtain serial cardiac enzymes. Start aspirin 325 mg      p.o. q. day,  Lopressor 12.5 mg p.o. b.i.d., nitroglycerin paste 1/2 inch      q.4 hours. Obtain fasting lipid profile. Will empirically start on      Protonix 40 mg p.o. b.i.d. for reflux disease.   1.  COUGH:  Most likely secondary to postnasal drip. Will give Nasonex nasal      spray 1 q. day. Tussionex 5 ml p.o. b.i.d., Robitussin p.r.n.   1.  HYPERTENSION:  Resume Verapamil. Will hold Benicar secondary to lowish      blood pressure. In addition,  the patient is started on beta blocker.      This can be resumed with blood pressure improves.   1.  HYPOTHYROIDISM:  Resume Synthroid.   1.  HYSTERECTOMY:  Recent.   1.  HISTORY OF PNEUMONIA:  Postoperatively.      Mobolaji B. Corky Downs, M.D.  Electronically Signed     MBB/MEDQ  D:  07/15/2005  T:  07/15/2005  Job:  [id number]   cc:   Luanna Cole. Lenord Fellers, M.D.  Fax: [phone]

## 2011-01-22 NOTE — Discharge Summary (Signed)
[name], STUMP NO.:  [id number]   MEDICAL RECORD NO.:  [id number]          PATIENT TYPE:  OIB   LOCATION:  1604                         [facility]ACILITY:  [facility]   PHYSICIAN:  Almedia Balls. Fore, M.D.   DATE OF BIRTH:  February 16, 1966   DATE OF ADMISSION:  06/03/2005  DATE OF DISCHARGE:  06/04/2005                                 DISCHARGE SUMMARY   HISTORY:  The patient is a 45 year old with abnormal uterine bleeding,  pelvic pain for hysterectomy on June 03, 2005.  The remainder of the  history and physical are as previously dictated.   LABORATORY DATA:  Include preoperative hemoglobin 13.3.  Chest x-ray was  normal.  Electrocardiogram was normal.   HOSPITAL COURSE:  The patient was taken to the operating room on June 03, 2005, at which time TAH was performed.  The patient did well  postoperatively.  Diet and ambulation were progressed over the evening of  September 28 and early morning of September 29.  On the morning of September  29, she was afebrile and experiencing no problems except for pain which was  managed by oral analgesics.  It was felt that she could be discharged at  this time.   FINAL DIAGNOSES:  1.  Abnormal uterine bleeding.  2.  Pelvic pain.   OPERATIONS:  TAH, pathology report unavailable at the time of dictation.   DISPOSITION:  Discharged home to return to the office in 2 weeks for  followup.  She was instructed to gradually progress her activities over  several weeks at home and to limit lifting and driving for 2 weeks.  She was  fully ambulatory, on a regular diet and in good condition at the time of  discharge.  She was given a prescription for Percocet generic 10/325, number  30, to be taken 1 q.6h. p.r.n. pain and doxycycline 100 mg number 12 to be  taken b.i.d.           ______________________________  Almedia Balls. [name] Patient, M.D.     SRF/MEDQ  D:  06/04/2005  T:  06/04/2005  Job:  [id number]

## 2011-01-22 NOTE — Assessment & Plan Note (Signed)
[facility]                            CARDIOLOGY OFFICE NOTE   NAME:Band, [name]                       MRN:          [id number]  DATE:12/23/2006                            DOB:          1966/04/29    REFERRING PHYSICIAN:  Luanna Cole. Lenord Fellers, M.D.   INTERVAL HISTORY:  Ms. [name] is a very pleasant 45 year old woman with  a history of chest pain with normal coronary arteries, as well as  nonischemic cardiomyopathy, which has resolved.   PAST MEDICAL HISTORY:  Also includes hypertension and hyperlipidemia.  For a full problem list, please see my note of March 21, 2006.   She presents today for routine followup.  She is doing quite well.  She  denies any chest pain or shortness of breath.  She remains quite active  2 or 3 jobs without any difficulty.  She does not have any lower  extremity edema, orthopnea, or PND.   CURRENT MEDICATIONS:  1. Lisinopril 10 mg a day.  2. Synthroid 100 mcg a day.  3. Coreg CR 40 mg a day.  4. Aspirin 81 a day.  5. Hydrochlorothiazide 25 a day.  6. Protonix 20 a day.   PHYSICAL EXAMINATION:  She is well appearing.  In no acute distress.  Ambulates around the clinic without any respiratory difficulty.  Blood pressure is 124/70 in the right arm manually.  Heart rate is 79.  Weight 204.  HEENT:  Normal.  NECK:  Supple.  No JVD.  Carotids are 2+ bilaterally without any bruits.  There is no lymphadenopathy or thyromegaly.  CARDIAC:  Regular rate and rhythm.  No murmurs, rubs, or gallops.  LUNGS:  Clear.  ABDOMEN:  Soft, obese, non-tender, non-distended.  No  hepatosplenomegaly.  No bruits.  No masses.  Good bowel sounds.  EXTREMITIES:  Warm with no cyanosis, clubbing, or edema.  No rashes.  DP  pulses are 2+ bilaterally.  NEUROLOGIC:  Alert and oriented x3.  Cranial nerves 2 through 12 are  grossly intact.  Moves all 4 extremities without difficulty.  Affect is  pleasant.   EKG shows normal sinus rhythm at a rate of  79.  There is no ST-T wave  abnormalities.   ASSESSMENT AND PLAN:  1. Hypertension.  This is well controlled.  Continue current therapy.  2. History of nonischemic cardiomyopathy.  Last echocardiogram showed      an ejection fraction of 55% to 60%.  She has no symptoms of heart      failure.  Continue current regimen.  3. Hyperlipidemia.  This is followed by Dr. Lenord Fellers.   DISPOSITION:  We will see her back in 9 months, and repeat an  echocardiogram at that time to ensure stability of her myocardial  function.     Bevelyn Buckles. Bensimhon, MD  Electronically Signed    DRB/MedQ  DD: 12/23/2006  DT: 12/24/2006  Job #: [id number]   cc:   Luanna Cole. Lenord Fellers, M.D.

## 2011-01-22 NOTE — Assessment & Plan Note (Signed)
[facility]                              CARDIOLOGY OFFICE NOTE   NAME:Radcliffe, KINGA CASSAR                       MRN:          [id number]  DATE:03/21/2006                            DOB:          1966-06-22    Primary care physician is Dr. Lenord Fellers.   PATIENT IDENTIFICATION:  [name] is a delightful 45 year old woman who  returns for routine followup.   PROBLEM LIST:  1.  Chest pain.      1.  Cardiac catheterization December 2006 with mild nonobstructive          coronary artery disease.  EF 55-60%.  There was a myocardial bridge          in the LAD.      2.  CT scan of the chest November 2006 negative for pulmonary embolus.  2.  Mild nonischemic cardiomyopathy, resolved.      1.  Echocardiogram November 2006 with an EF of 40-50%.      2.  Echocardiogram June 2007.  EF of 55-60% with mild diastolic          dysfunction and mildly dilated LV.  No significant valvular disease.  3.  Hypertension.  4.  Hypothyroidism.  5.  Tobacco use reported, now quit.  6.  Obesity.  7.  Hyperlipidemia with hypertriglyceridemia.   CURRENT MEDICATIONS:  Lisinopril 10 mg a day, Lipitor 40 mg a day, Synthroid  100 mcg a day, Coreg 40 mg a day, aspirin 81 mg a day, Imdur 30 mg a day,  hydrochlorothiazide 25 a day.   ALLERGIES:  PENICILLIN and SULFA.   INTERVAL HISTORY:  Ms. Mulhall returns today for routine followup.  She says  she is doing quite well.  She denies any more chest pain or shortness of  breath.  She has not had any symptoms of heart failure.  Recent  echocardiogram shows normalization of her ejection fraction.  She has  continued to refrain from cigarette smoking.   PHYSICAL EXAMINATION:  GENERAL:  Well appearing in no acute distress.  Ambulates around the clinic without any respiratory difficulty.  VITAL SIGNS:  Blood pressure is 114/66.  Heart rate is 80.  Weight is 201  pounds.  HEENT:  Sclerae are anicteric.  EOMI.  There is no xanthelasmas.   Mucous  membranes are moist.  NECK:  Supple.  There is no JVD.  Carotids are 2+ bilaterally without any  bruits.  There is no lymphadenopathy or thyromegaly.  CARDIAC:  Regular rate and rhythm.  No murmurs, rubs or gallops.  LUNGS:  Clear.  ABDOMEN:  Soft, nontender and nondistended.  Mildly obese.  Good bowel  sounds.  EXTREMITIES:  Warm with no cyanosis, clubbing or edema.  NEUROLOGIC:  She is alert and oriented x3.  Cranial nerves II-XII are  intact.  She moves all four extremities without difficulty.   ASSESSMENT/PLAN:  1.  Chest pain with very mild coronary artery disease.  She has no further      chest pain.  I suspect that this was likely musculoskeletal.  I would  continue current therapy.  2.  Mild left ventricular dysfunction.  I suspect this was related to her      high blood pressure.  Her EF has recovered with medical therapy.  We      will continue on her current medications.  Can titrate as needed.  3.  Hypertension.  Blood pressure much improved.  4.  Hyperlipidemia, followed by Dr. Lenord Fellers.   DISPOSITION:  Continue current therapy.  I told her she could stop her Imdur  if she would like as it is only for symptomatic relief.  We will follow her  up in nine months.                                Bevelyn Buckles. Bensimhon, MD    DRB/MedQ  DD:  03/21/2006  DT:  03/22/2006  Job #:  [id number]   cc:   Luanna Cole. Lenord Fellers, MD

## 2011-01-22 NOTE — Consult Note (Signed)
NAME:  [name], [name]                          ACCOUNT NO.:  [id number]   MEDICAL RECORD NO.:  [id number]                   PATIENT TYPE:  EMS   LOCATION:  ED                                   FACILITY:  [facility]   PHYSICIAN:  Lorre Munroe., M.D.            DATE OF BIRTH:  Feb 09, 1966   DATE OF CONSULTATION:  12/06/2003  DATE OF DISCHARGE:                                   CONSULTATION   CHIEF COMPLAINT:  Abdominal pain.   HISTORY OF PRESENT ILLNESS:  This is a 45 year old white female, who has a 2  day history of increasing right lower quadrant pain preceded by about a 10-  12 day history of slight discomfort in the lower part of the abdomen.  The  pain is more to the right than to the left.  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  She had a chill but has had no fever.  She had nausea but no  vomiting.  Yesterday, she had a little bit of diarrhea.   At the emergency department, her white count was 11,700 and hemoglobin 12,  and she was found to have some tenderness in the right lower abdomen.  Dr.  Lynelle Doctor ordered a CT scan of the abdomen and pelvis which was entirely  negative, and the appendix was well seen and was negative.  The patient has  a history of ovarian cysts and of endometriosis thought diagnosed by  laparoscopy.  She has not had any known trouble with the endometriosis  recently.   PAST MEDICAL HISTORY:  She is generally healthy and has no serious chronic  ailments.  She specifically denies any chronic GI problems.   REVIEW OF SYSTEMS:  Negative for urinary tract symptoms, abdominal bulges.   Family history and childhood illnesses are unremarkable.   PHYSICAL EXAMINATION:  VITAL SIGNS:  The temperature and vital signs were  remarkable as reported by nursing staff.  The patient is in no acute  distress.  HEAD AND NECK:  Normal.  CHEST:  Heart rate and rhythm were normal.  ABDOMEN:  It is very  soft, slightly tender in the extreme lower right lower  quadrant.  Bowel sounds are normal.  No mass or rebound tenderness was  present.  No hernia could be demonstrated.  EXTREMITIES:  Normal.   IMPRESSION:  Probable musculoskeletal abdominal pain.   RECOMMENDATIONS:  The patient is advised to take it easy, and I gave her a  prescription for Vicodin to be taken along with some ibuprofen.  I gave her  my card and asked me to call me if she worsens or just fails to improve.  Lorre Munroe., M.D.    Jodi Marble  D:  12/06/2003  T:  12/07/2003  Job:  [id number]

## 2011-01-22 NOTE — Consult Note (Signed)
[name], [name] NO.:  [id number]   MEDICAL RECORD NO.:  [id number]          PATIENT TYPE:  INP   LOCATION:  3701                         FACILITY:  [facility]   PHYSICIAN:  Arvilla Meres, M.D. LHCDATE OF BIRTH:  02/18/1966   DATE OF CONSULTATION:  DATE OF DISCHARGE:  07/17/2005                                   CONSULTATION   DATE OF CARDIOLOGY CONSULTATION:  July 16, 2005.   REQUESTING PHYSICIAN:  Joanna Hews, MD.   REASON FOR CONSULTATION:  Chest pain and abnormal echocardiogram.   HISTORY OF PRESENT ILLNESS:  Ms. [name] is a very pleasant, 45 year old  woman with a history of poorly controlled hypertension and noncardiac chest  pain, who was admitted with recurrent chest pain.  According to her records,  she underwent a cardiac catheterization in [place] in 2004 for chest  discomfort.  Left ventricular ejection fraction at that time was noted to be  50%.  There was documentation of myocardial bridging in the LAD, which was  non-flow-limiting.  Otherwise, there was no coronary artery disease.  At the  time, there was some question of coronary spasm, but this was never  documented.  Since that time, she has done quite well from a cardiac  perspective.  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  About six weeks ago, she had a total hysterectomy and had a  very complicated postoperative course with a persistent cough and pneumonia.  About a week and a half ago, she was able to go back to work.  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  On putting the patient down, the pain went away.  However, while  she was pushing the patient down the hall, she had recurrent chest pain.  They checked her blood pressure at that time, it was 150/90, and so she was  brought to the hospital.  Since being  in the hospital, she has had a CT  angiography of the chest, which showed no evidence of pulmonary embolus.  Her chest wall has been very sore to palpation, and the pain is much worse  any time she moves her arms or coughs.  She does not note any increase in  the pain when she walks around the room.  She ruled out for a myocardial  infarction with serial cardiac markers.  An echocardiogram was obtained,  which showed an EF of 40-50% with mild global hypokinesis.  There were no  significant valvular abnormalities.  Given that her EF appeared to be mildly  decreased from 2004 in the setting of chest pain, we are asked to consult.   REVIEW OF SYSTEMS:  She denies any orthopnea or PND.  She does have some  very mild lower extremity edema at times.  She denies any palpitations,  syncope, or presyncope.  She denies any fevers or chills but has had some  sweats as well  as a persistent, nonproductive cough.  From a musculoskeletal  standpoint, she has had chronic arthralgias due to some arthritis.  She is  also under a great deal of emotional stress with her home situation.  The  rest of her review of systems is negative except for HPI and Problem List.   PROBLEM LIST:  1.  History of noncardiac chest pain:      1.  Cardiac catheterization in [place] in 2004 revealed a myocardial          bridge in the LAD, which was non-flow-limiting.  There was no          significant CAD.  The EF was 50%.  2.  Hypertension, poorly controlled.  3.  History of mitral valve prolapse, but it was not evident on this      echocardiogram.  4.  Hypothyroidism.  5.  Mild obesity.  6.  Hypertriglyceridemia.   SURGICAL HISTORY:  1.  Status post hysterectomy.  2.  Status post cholecystectomy.   CURRENT MEDICATIONS:  Aspirin 325, Lovenox 40, Ventolin, Tussionex,  Atrovent, Synthroid, Lopressor 12.5 b.i.d., nitroglycerin paste, Protonix,  and Verapamil.   DRUG ALLERGIES:  1.  PENICILLIN.  2.  SULFA.   SOCIAL  HISTORY:  She lives in [name] her husband.  She has two  children of her own and one child she has adopted.  She has a history of  tobacco use, about one pack per day for 25 years but quit one year ago.  She  denies any significant alcohol use or drug use.   FAMILY HISTORY:  Her mother died at 19 due to an ischemic cardiomyopathy and  CHF.  She had multiple MIs.  Her father died at 35 due to colon cancer.  He  did not have CAD.  Siblings:  There was no coronary artery disease.  Maternal grandfather died at age 28 due to strokes and heart attacks.  Maternal grandmother died at age 9 while in a nursing home.   PHYSICAL EXAMINATION:  GENERAL:  She is lying flat in bed and in no acute  distress.  She has a dry, hacking cough and has pain in her chest when she  coughs.  VITAL SIGNS:  Blood pressure is 90/60, her heart rate is 62, her temperature  is 98.1, she is sating 98% on 2 liters.  HEENT:  Sclerae anicteric, EOMI, there is no xanthelasma, mucous membranes  are moist.  NECK:  Supple, there is no JVD, carotids are 2+ bilaterally without any  bruits, there is no lymphadenopathy or thyromegaly.  CARDIAC:  She has a regular rate and rhythm with no significant murmurs,  rubs, or gallops.  She is significantly tender along the left side of her  sternum as well as out by her apex.  This reproduces her pain.  I am also  able to reproduce her pain by having her push her hand against my hand.  LUNGS:  Initially had some mild crackles in the bases, but this cleared with  deep inspiration.  There were no wheezes or rales.  ABDOMEN:  Soft, nontender, nondistended, there is no hepatosplenomegaly, no  bruits, no masses.  There are good bowel sounds.  EXTREMITIES:  Warm with no cyanosis, clubbing, or edema.  Distal pulses are  1+ bilaterally.  NEUROLOGIC:  She seems to be under a lot of stress, but her affect is otherwise bright,  she is alert and oriented x3, cranial nerves II-VII are   intact, she moves  all four extremities without any difficulty.   ELECTROCARDIOGRAM:  EKG shows a normal sinus rhythm at a rate of 60 with no  ST T-wave changes.  There are normal intervals and axis.   LABORATORY VALUES:  White count is 11.4, hemoglobin is 12.4, platelets are  513, sodium is 138, potassium is 3.8, BUN is 12, creatinine is 0.8, glucose  is 83, cardiac enzymes are negative x3, D-dimer is less than 0.22.   ASSESSMENT AND PLAN:  I think Ms. Coakley's chest pain is very atypical and  clearly reproducible on palpation and likely represents a musculoskeletal  etiology.  Given the character of her symptoms and recent negative  catheterization, I do not think she needs further cardiac workup.  She does  have a mild, nonischemic cardiomyopathy; however the ejection fraction of 45-  50% currently on echocardiogram is really not significantly different from  her previous catheterization.  I suspect she may have an early hypertensive  cardiomyopathy.  At this point, I would recommend:  1.  Discontinue Verapamil as this can be detrimental in heart failure.  2.  Start Coreg 6.25 mg b.i.d. and titrate as an outpatient to a goal of 25      mg b.i.d.  3.  Start Lisinopril 20 mg a day.  She will need her electrolytes checked in      one week.   Otherwise, she is stable for a discharge from our perspective.  We would be  happy to follow her up in the Heart Failure Clinic if so desired.      Arvilla Meres, M.D. [facility]  Electronically Signed     DB/MEDQ  D:  07/16/2005  T:  07/18/2005  Job:  [id number]   cc:   Luanna Cole. Lenord Fellers, M.D.  Fax: [phone]

## 2011-01-22 NOTE — Discharge Summary (Signed)
[name], [name] NO.:  [id number]   MEDICAL RECORD NO.:  [id number]          PATIENT TYPE:  INP   LOCATION:  3701                         FACILITY:  [facility]   PHYSICIAN:  Hettie Holstein, D.O.    DATE OF BIRTH:  Sep 20, 1965   DATE OF ADMISSION:  07/15/2005  DATE OF DISCHARGE:  07/17/2005                                 DISCHARGE SUMMARY   REASON FOR ADMISSION:  Chest pain.   PRINCIPAL DIAGNOSES:  1.  Chest pain, no evidence of acute myocardial infarction.  2.  Hypertensive cardiomyopathy with a depressed ejection fraction.  3.  Hyperlipidemia with elevated LDL of 115.  4.  History of mitral valve prolapse.   INVESTIGATIONS DURING THIS HOSPITALIZATION:  1.  A 2-D echocardiogram, which did reveal a depressed EF of 40-50% with      diffuse left ventricular hypokinesis.  Cardiology consultation was      sought with [facility], who recommended initiation of Coreg and      discontinuation of verapamil and continuation of ARB/ACE for further      titration in outpatient setting.  They did recommend a CHF clinic follow-      up.  2.  The patient did undergo a lipid panel, which did reveal an LDL of 115,      HDL of 36, triglycerides of 235.  3.  Cardiac markers as noted above were not revealing of an acute ischemic      event.   HISTORY OF PRESENT ILLNESS:  [name] is a pleasant 45 year old female  with a history of hypertension, hypothyroidism, who presented with  retrosternal chest pain and with shortness of breath, radiating to her back.  There was no nausea or vomiting or palpitations.  She did experience some  diaphoresis.  Recently she did have surgery and had a pneumonia, which was  diagnosed at Urgent Care, and placed on antibiotics.  Since then she has had  a cough that has not gone away.  She did experience some pleuritic chest  pain also with movement.  She denied any calf pain.  In any event, she  underwent CT scanning in the emergency  department that was not revealing of  acute process or pulmonary embolus.  Her D-dimer was also within normal  limits.   HOSPITAL COURSE:  She was admitted for chest pain evaluation.  She underwent  serial cardiac markers, which did not reveal evidence of acute ischemic  event.  Medical records from [facility] hospitalization in January 2004  were reviewed, including stress test and cardiac catheterization at that  time.  Her blood pressure on admission was 108/58, and she remained  normotensive throughout her course.  She was seen by [facility]iology, and  no further diagnostic intervention was pursued.  She was recommended to  initiate Coreg.  I will place her on lisinopril in place of her outpatient  regimen with Benicar, and this was the recommendation of cardiology.  Will  start her at a low dose, 10 mg, since her blood pressure is in the upper 90s  to low  100s at this time.   MEDICATIONS ON DISCHARGE:  1.  Coreg 6.25 mg twice daily, to up-titrate in the outpatient setting.  2.  She was instructed to stop verapamil.  3.  Continue Synthroid at 100 mcg daily as before.  4.  Lisinopril, will start her at 10 mg daily, which can be up-titrated in      addition.      Hettie Holstein, D.O.  Electronically Signed     ESS/MEDQ  D:  07/17/2005  T:  07/17/2005  Job:  [id number]   cc:   Luanna Cole. Lenord Fellers, M.D.  Fax: [phone]   Arvilla Meres, M.D. [facility]  [facility]  [address]  [place]  [place] [address]

## 2011-01-22 NOTE — Op Note (Signed)
[facility]  Patient:    [name], [name]                       MRN: [id number] Proc. Date: 10/13/00 Adm. Date:  [id number] Disc. Date: [id number] Attending:  Lendon Colonel                           Operative Report  PREOPERATIVE DIAGNOSIS:       Pelvic pain, desires sterilization.  POSTOPERATIVE DIAGNOSIS:      Pelvic pain, desires sterilization.  OPERATION:                    Laparoscopy with laparoscopic tubal ligation.  SURGEON:                      Katherine Roan, M.D.  DESCRIPTION OF PROCEDURE:     The patient was placed in the lithotomy position, the bladder was emptied, a transverse incision was made in the abdomen after the Hulka elevator was inserted into the uterus.  Visualization of the pelvis was difficult to obtain pneumoperitoneum.  We were finally able to do that after placing a 150 mm Veress needle in the umbilicus.  We had tried in the left upper quadrant.  I was never able to get a double clip.  The abdomen was extended finally with 3 L of CO2. I placed a trocar into the abdomen and visualized two normal tubes and ovaries.  There was ______ evidence of endometriosis.  At that time, the bladder was somewhat distended, so I emptied the bladder again.  I then tied her tubes with Kleppinger forceps and scissored these.  The tubes were widely separated.  No other pathology was noted.  All areas of the abdomen were visualized.  There was a fairly modest amount of pneumoperitoneum anteriorly.  The gas was evacuated and the skin was closed with 3-0 Dexon.  She was awakened and carried to recovery room in good condition. DD:  10/13/00 TD:  10/15/00 Job: [id number]

## 2011-01-22 NOTE — H&P (Signed)
[name], DESROCHER NO.:  [id number]   MEDICAL RECORD NO.:  [id number]           PATIENT TYPE:   LOCATION:                                 FACILITY:   PHYSICIAN:  Joy Balls. Fore, M.D.        DATE OF BIRTH:   DATE OF ADMISSION:  DATE OF DISCHARGE:                                HISTORY & PHYSICAL   DATE OF ADMISSION:  June 03, 2005   HISTORY:  The patient is a 45 year old gravida 3 para 2 whose last menstrual  period was mid August 2006. She has been spotting since that date. She has  been seen in our office since May 2003 for abnormal uterine bleeding and  pelvic pain. She was evaluated initially for these problems and underwent  ultrasound with findings of changes within the uterus and tenderness in the  adnexal areas. She underwent hysteroscopy D&C, laparoscopy in July 2003 with  benign findings on the pathology. She did have significant changes in her  pelvis which were treated as well. She has had progressively-severe problems  over the past several years with pelvic pain and dyspareunia. She is  admitted at this time for hysterectomy, possible bilateral salpingo-  oophorectomy. She has been fully counseled as to the nature of the procedure  and the risks involved to include risks of anesthesia; injury to bowel,  bladder, blood vessels, ureters; postoperative hemorrhage; infection;  recuperation; use of hormone replacement should her ovaries be removed. She  fully understands all these considerations and wishes to proceed on  June 03, 2005.   PAST MEDICAL HISTORY:  Includes diskectomy in her cervical area with fusion  in 2003. Tubes tied in 2001. Cholecystectomy approximately 2001.  Laparoscopic evaluation as well as D&C in 2003. She underwent endometrial  biopsy in August 2006 with benign findings. Pap smear was normal in June  2006. She is allergic to SULFA and PENICILLIN. She has been taking  progestational agents and hydrocodone for control  of menses and abnormal  bleeding and for the pain.   FAMILY HISTORY:  Includes father died at 22 of colon cancer. Grandfather on  her maternal side with myocardial infarction. Mother with hypertension.   REVIEW OF SYSTEMS:  HEENT:  Headaches. CARDIORESPIRATORY:  Some asthmatic  changes at times. GASTROINTESTINAL:  Colonoscopies have been done which have  shown no malignant changes. GENITOURINARY:  As in present illness.  NEUROMUSCULAR:  Negative.   PHYSICAL EXAMINATION:  VITAL SIGNS:  Height 5 feet 6 inches, weight 194  pounds, blood pressure 124/78, pulse 84, respirations 18.  GENERAL:  Well-developed white female in no acute distress.  HEENT:  Within normal limits.  NECK:  Supple without masses, adenopathy or bruits.  HEART:  Regular rate and rhythm without murmurs.  BREASTS:  Examined sitting and lying without mass. Axilla negative.  ABDOMEN:  Flat and soft with tenderness in both lower quadrants. No palpable  mass.  PELVIC:  External genitalia, Bartholin's, urethra, and Skene's glands within  normal limits. Cervix is slightly inflamed. Uterus is mid posterior,  enlarged to approximately [redacted] weeks gestational  size, and tender on palpation  and manipulation. Adnexal exam reveals tenderness bilaterally with the right  being greater than the left. Anterior and posterior cul-de-sac exam is  confirmatory.  EXTREMITIES:  Within normal limits.  CENTRAL NERVOUS SYSTEM:  Grossly intact.  SKIN:  Without suspicious lesions.   IMPRESSION:  Abnormal uterine bleeding, pelvic pain.   DISPOSITION:  As noted above.   ADDENDUM TO THE ABOVE VISIT:  She has been placed on Benicar and verapamil  for hypertension and some cardiac irregularities, as well as Synthroid for  which she has been maintained by her primary care physician, Dr. Eden Emms  Baxley.           ______________________________  [name] Patient, M.D.     SRF/MEDQ  D:  05/27/2005  T:  05/27/2005  Job:  [id number]

## 2011-01-22 NOTE — H&P (Signed)
[place]  Patient:    [name], [name]                       MRN: [id number] Adm. Date:  [id number] Attending:  Barton Fanny                         History and Physical  HISTORY OF PRESENT ILLNESS:  The patient is a 45 year old right-handed white female who was evaluated for a cervical radiculopathy secondary to cervical disk herniation.  Her difficulties began about New Years when she fell down some steps.  She initially had pain in the right side of her neck and then subsequently the pain extended down to the right shoulder, arm, and forearm, with tingling in her right hand.  She has a sense of heaviness in her right upper extremity but does not describe any specific weakness.  She has been treated with a number of medications, including a prednisone dosepack, Flexeril, and hydrocodone without relief, she has been taking ibuprofen and Percocet, but with none of these has she gotten sufficient relief and she presents now for definitive surgical intervention.  MRI scan revealed degenerative disk disease at C5-6 and C6-7 with superimposed disk herniations at C5-6 and C6-7.  There is mild degeneration at the C4-5 level.  Neural compression is worse to the right than to the left at C5-6 and C6-7.  PAST MEDICAL HISTORY:  There is no history of hypertension, myocardial infarction, cancer, stroke, diabetes, peptic ulcer disease, or lung disease.  PREVIOUS SURGERY:  Includes a tubal ligation last month, cholecystectomy in January 2001, and removal of a right ovarian cyst in 1993.  ALLERGIES:  She reports allergies to PENICILLIN and SULFA and has gastrointestinal intolerance of PREDNISONE, ERYTHROMYCIN, and TYLENOL NO. 3 - all of which make her sick.  CURRENT MEDICATIONS:  Included ibuprofen.  FAMILY HISTORY:  Her father died at age 11 colon cancer.  Her mother is in good heath at age 47 with a history of bronchitis.  There is a family  history of hypertension in her mothers side.  SOCIAL HISTORY:  Patient is unemployed; she is married.  Her children are ages two, 98, and 33, and her two-year-old was adopted.  She does not smoke, drink alcoholic beverages, or have a history of substance abuse.  REVIEW OF SYSTEMS:  Notable for those difficulties described in her history of present illness and past medical history but is otherwise unremarkable.  PHYSICAL EXAMINATION:  GENERAL:  Patient is a well-developed, well-nourished white female in no acute distress.  VITAL SIGNS:  Temperature 97.1, pulse 77, blood pressure 131/76, respiratory rate 16.  Height 5 feet 5.5 inches, weight 204 pounds.  LUNGS:  Clear to auscultation.  She has symmetrical respiratory excursion.  HEART:  Regular rate and rhythm, normal S1, S2.  There is no murmur.  ABDOMEN:  Soft, nontender, bowel sounds are present.  EXTREMITIES:  Show no clubbing, cyanosis, or edema.  MUSCULOSKELETAL:  Shows some tenderness to palpation in the upper and lower cervical region, as well as in the right paracervical region.  Range of motion in the neck is somewhat limited due to discomfort, particularly with forward flexion as well as lateral flexion to the right.  She is able to laterally flex to the left and to extend better.  NEUROLOGIC:  Shows 5/5 strength throughout the upper extremities including the deltoid, biceps, triceps, intrinsics, and grip.  Sensation  is intact to pinprick through the upper extremities.  Reflexes are 1 in the biceps, brachioradialis, triceps, quadriceps, and gastrocnemii - they are all symmetrical - and toes are downgoing bilaterally.  She has a normal gait and stance.  IMPRESSION:  Right cervical radiculopathy secondary to cervical disk herniation C5-6 to C6-7 superimposed upon degenerative disk disease and spondylosis.  PLAN:  The patient to be admitted for a two-level C5-6 and C6-7 anterior cervical diskectomy and arthrodesis  with allograft and cervical plating.  We have discussed the nature of surgery; alternatives to the surgical procedure; typical length of surgery, hospital stay, and overall recuperation and limitations during the postoperative period; the need for postoperative immobilization with a soft cervical collar; and risks of surgery including risk of infection, bleeding, the possible need for transfusion; the risk of nerve root dysfunction with pain, weakness, numbness, or paresthesias; the risk of spinal cord dysfunction with paralysis of all four limbs and quadriplegia; the risk of esophageal and/or laryngeal dysfunction with difficulty swallowing or hoarseness of the voice; and anesthetic risks of myocardial infarction, stroke, pneumonia, and death.  Understanding all this, she does wish to proceed with surgery and is admitted for such. DD:  11/28/00 TD:  11/28/00 Job: [id number]

## 2011-01-22 NOTE — Op Note (Signed)
[name], [name] NO.:  [id number]   MEDICAL RECORD NO.:  [id number]          PATIENT TYPE:  AMB   LOCATION:  DAY                          FACILITY:  [facility]   PHYSICIAN:  Almedia Balls. Fore, M.D.   DATE OF BIRTH:  [birth year]   DATE OF PROCEDURE:  06/03/2005  DATE OF DISCHARGE:                                 OPERATIVE REPORT   PREOPERATIVE DIAGNOSIS:  Abnormal uterine bleeding, pelvic pain.   POSTOPERATIVE DIAGNOSES:  1.  Abnormal uterine bleeding, pelvic pain.  2.  Pending pathology.   OPERATION:  Total abdominal hysterectomy.   ANESTHESIA:  General orotracheal.   OPERATOR:  Dr. [name] Patient   FIRST ASSISTANT:  Dr. Laureen Ochs   INDICATION FOR SURGERY:  The patient is a 45 year old with persistent  abnormal uterine bleeding in spite of hormone suppression for hysterectomy.  She has been fully counseled as to the type of procedure to be performed and  the risks involved including risks of anesthesia, injury to bowel, bladder,  blood vessels, ureters, postoperative hemorrhage, infection, recuperation.  She fully understands all these considerations and wishes to proceed and has  signed informed consent to proceed on 28 June 03 2005.   OPERATIVE FINDINGS:  On an attempt to effect descent of the uterus, I found  that the uterus would not descend.  Accordingly, an abdominal procedure was  performed.  In the pelvis, the uterus was mid posterior, normal size, shape,  and contour, but with large number of uterine vessels present.  The upper  abdominal viscera including the liver, area of the gallbladder, spleen,  kidneys, periaortic areas, and appendix were normal to visualization and/or  palpation.   PROCEDURE:  With the patient under general anesthesia, prepared and draped  in the usual sterile fashion, in the frog-leg position, a speculum was  placed in the vagina, and the anterior lip of the cervix was grasped with  single-tooth tenaculum.  An attempt was made to  effect descent of the uterus  which was unsuccessful.  It was therefore felt that she should have an  abdominal procedure.   The patient was then reprepared and draped for an abdominal procedure, and a  Foley catheter was placed.  A lower abdominal transverse incision was made  and carried into the peritoneal cavity without difficulty.  A self-retaining  retractor was placed, and the bowel was packed off.  Kelly clamps were used  to clamp the uterine ovarian anastomosis, tubes, and round ligaments  bilaterally for traction and hemostasis.  The round ligaments bilaterally  were transected using Bovie electrocoagulation with development of a bladder  flap anteriorly and entry into the retroperitoneal space.  Because of the  normal appearance of the ovaries bilaterally, Heaney clamps were placed  across the utero-ovarian anastomosis and tubes bilaterally, with these  structures been cut free and doubly ligated with 1 chromic catgut with  conservation of both tubes and ovaries.  Uterine vessels bilaterally were  then skeletonized, clamped, cut, and suture ligated with 1 chromic catgut.  Cardinal ligaments bilaterally were then clamped, cut, and suture ligated  with 1  chromic catgut.  It was then possible to core out the endocervix and  majority of the exocervix.  The defects and the cervix and vaginal area was  then reapproximated and rendered hemostatic with interrupted figure-of-eight  sutures of 1 chromic catgut.  A figure-of-eight suture was necessary in the  right uterine vessel area for hemostasis.  The area was lavaged with copious  amounts of lactated Ringer's solution and after noting that hemostasis was  maintained, the area was reperitonealized with interrupted sutures of 1  chromic catgut.  The ovaries and tubes were suspended bilaterally to the  ipsilateral round ligaments using interrupted sutures of 1 chromic catgut.  The area was again lavaged with copious amounts of lactated  Ringer solution.  After noting that hemostasis was maintained and that sponge and instrument  counts were correct, the peritoneum was closed with continuous suture of 0  Vicryl.  The fascia was closed with 2 sutures of 0 Vicryl which were brought  from the lateral aspects of the incision and tied separately in the midline.  Subcutaneous fat was reapproximated with interrupted horizontal mattress  sutures of 0 Vicryl.  Skin was closed with a subcuticular suture of 3-0  plain catgut.  Estimated blood loss 250 mL.  The patient was taken to the  recovery room in good condition with clear urine in the Foley catheter  tubing.  She will be placed on 23-hour observation following surgery.           ______________________________  Almedia Balls [name] Patient, M.D.     SRF/MEDQ  D:  06/03/2005  T:  06/03/2005  Job:  [id number]   cc:   Leona Singleton, M.D.  Fax: [phone]

## 2011-02-05 ENCOUNTER — Other Ambulatory Visit (HOSPITAL_COMMUNITY): Payer: Self-pay | Admitting: Internal Medicine

## 2011-02-05 DIAGNOSIS — Z139 Encounter for screening, unspecified: Secondary | ICD-10-CM

## 2011-02-12 ENCOUNTER — Ambulatory Visit (HOSPITAL_COMMUNITY)
Admission: RE | Admit: 2011-02-12 | Discharge: 2011-02-12 | Disposition: A | Discharge status: Home / Self Care | Payer: BC Managed Care – PPO | Source: Ambulatory Visit | Attending: Internal Medicine | Admitting: Internal Medicine

## 2011-02-12 DIAGNOSIS — Z1231 Encounter for screening mammogram for malignant neoplasm of breast: Secondary | ICD-10-CM | POA: Insufficient documentation

## 2011-02-12 DIAGNOSIS — Z139 Encounter for screening, unspecified: Secondary | ICD-10-CM
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
## 2011-03-10 IMAGING — MR MR HEAD WO/W CM
8 of 17 series · 29 of 48 positions shown · IV contrast (7ml Multihance)
Comparison: None

CLINICAL DATA: Secondary adrenal insufficiency.  Addison's disease.
Confusion.  Visual disturbance.  Difficulty walking.

MRI HEAD WITHOUT AND WITH CONTRAST
TECHNIQUE: Multiplanar, multiecho pulse sequences of the brain and
surrounding structures were obtained according to standard protocol
without and with intravenous contrast
Contrast: 7 ml Multihance

[Series 3: DWI · axial · 5.0mm · 0.72mm/px · z∈[-15,+115]mm · 2 of 21 slices shown (1 of 2)]
[im 1/21]
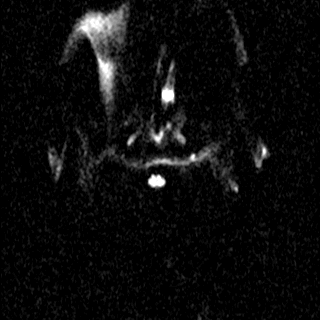
[im 21/21]
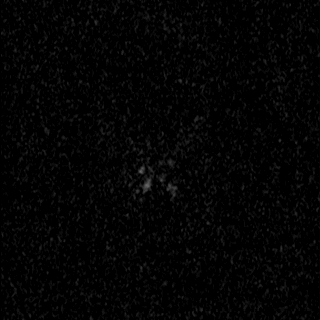

[Series 4: DWI · axial · 5.0mm · 0.72mm/px · z∈[-15,+122]mm · 3 of 22 slices shown (2 of 2)]
[im 1/22]
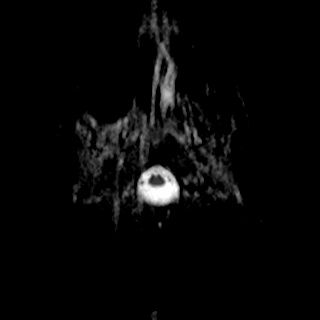
[im 11/22]
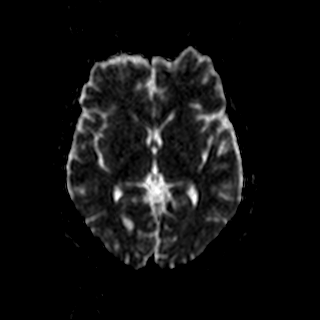
[im 22/22]
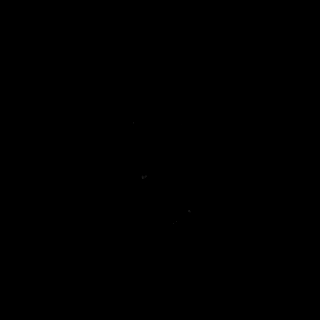

[Series 6: FLAIR · axial · 5.0mm · 0.64mm/px · z∈[-21,+117]mm · 4 of 24 slices shown]
[im 1/24]
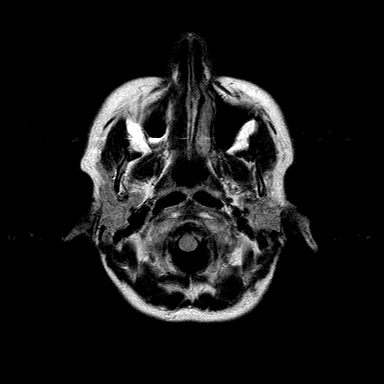
[im 8/24]
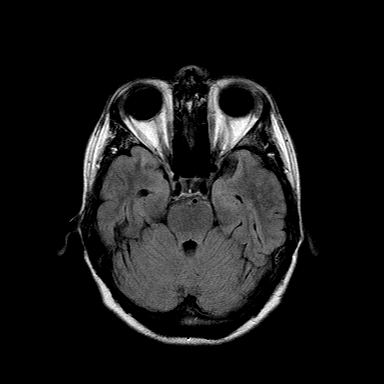
[im 16/24]
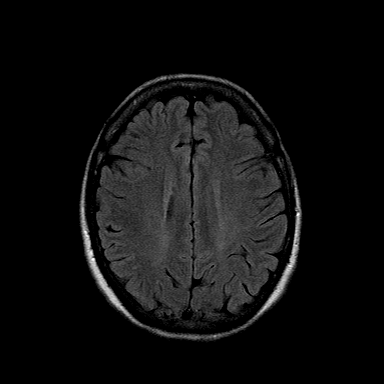
[im 24/24]
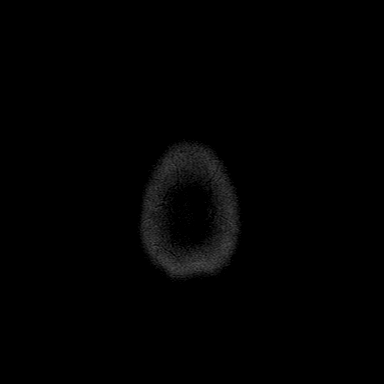

[Series 7: T2 · axial · 5.0mm · 0.51mm/px · z∈[-21,+117]mm · 4 of 24 slices shown]
[im 1/24]
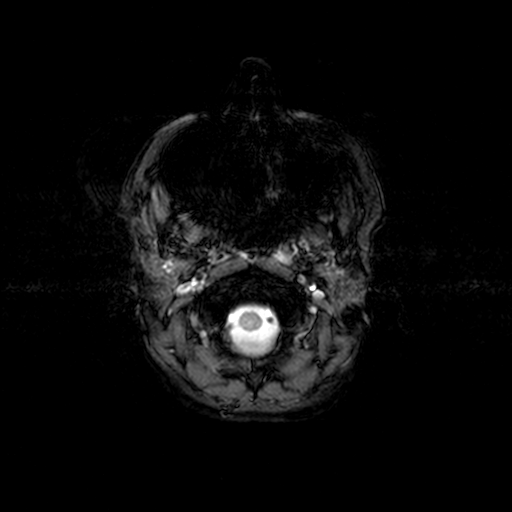
[im 8/24]
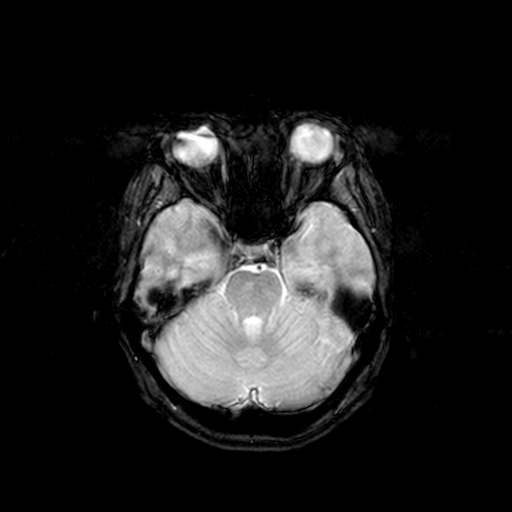
[im 16/24]
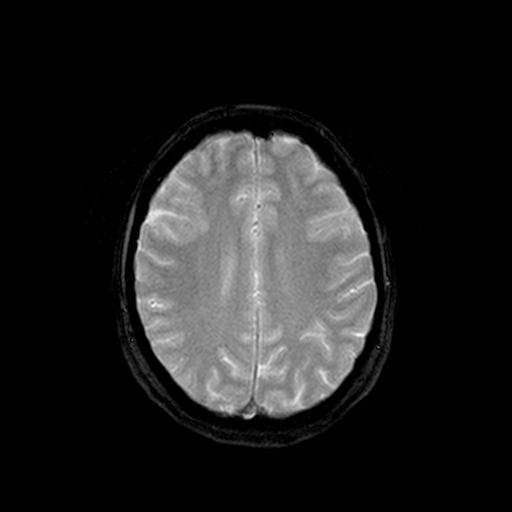
[im 24/24]
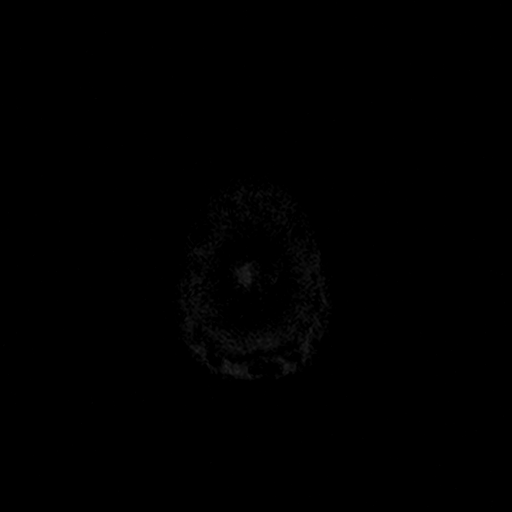

[Series 15: T1 post-contrast · sagittal · 3.0mm · 0.30mm/px · 2 of 16 slices shown (1 of 4)]
[im 1/16]
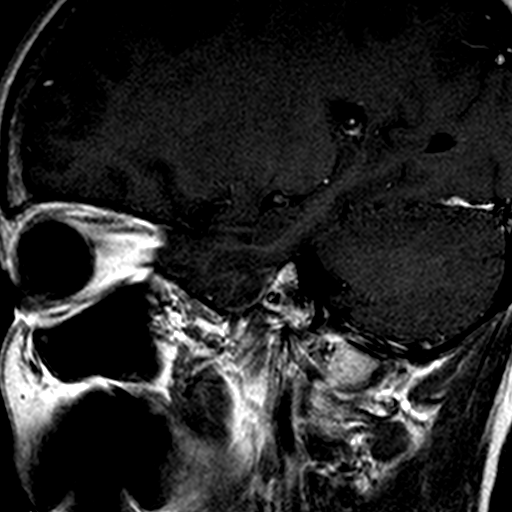
[im 16/16]
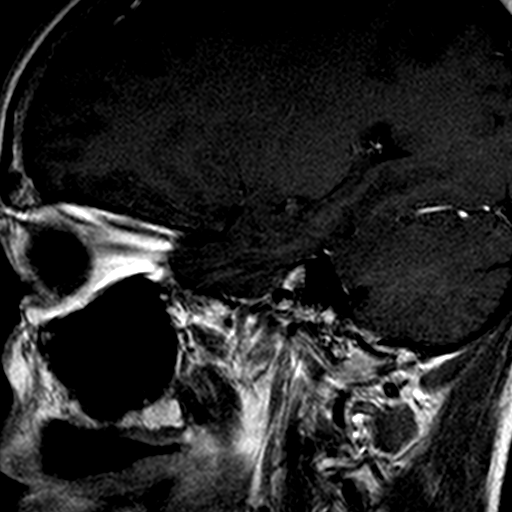

[Series 16: T1 post-contrast · coronal · 3.0mm · 0.25mm/px · 2 of 16 slices shown (2 of 4)]
[im 1/16]
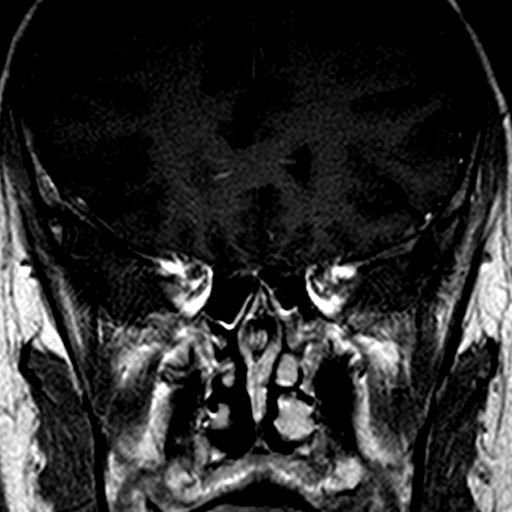
[im 16/16]
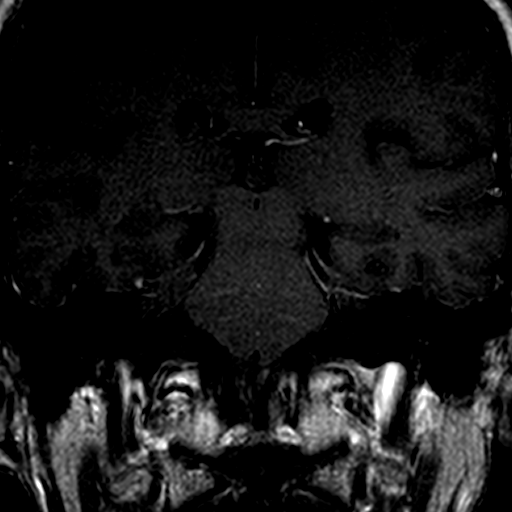

[Series 17: T1 post-contrast · axial · 2.0mm · 0.52mm/px · z∈[-28,+130]mm · 8 of 80 slices shown (3 of 4)]
[im 1/80]
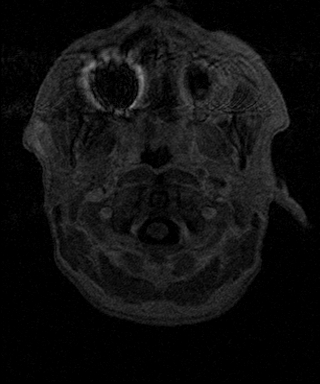
[im 15/80]
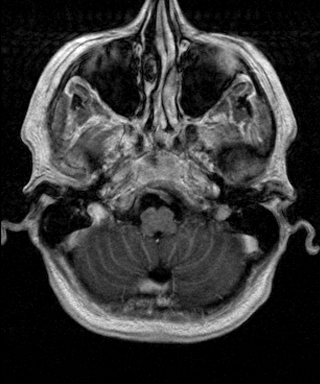
[im 22/80]
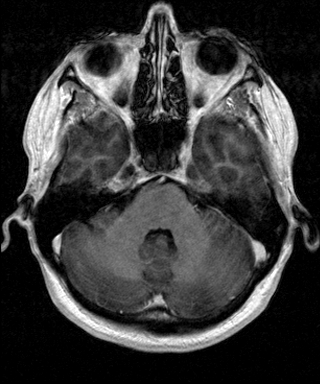
[im 36/80]
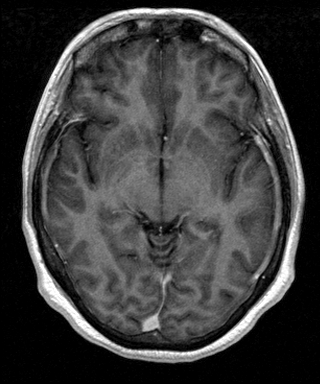
[im 44/80]
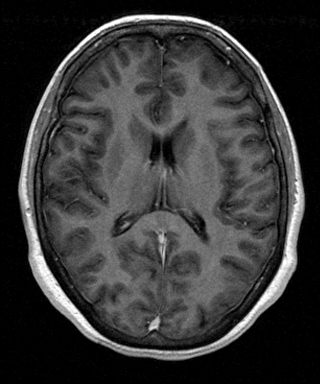
[im 58/80]
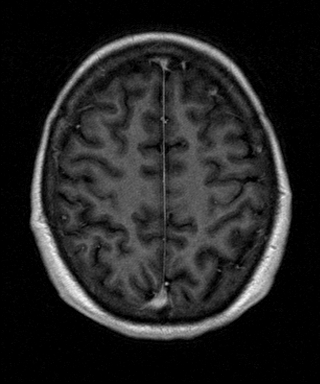
[im 65/80]
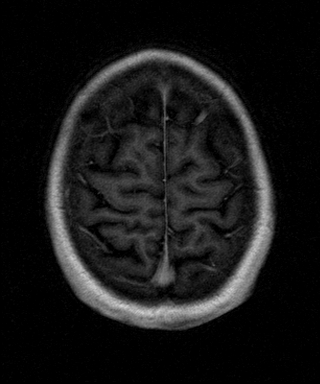
[im 80/80]
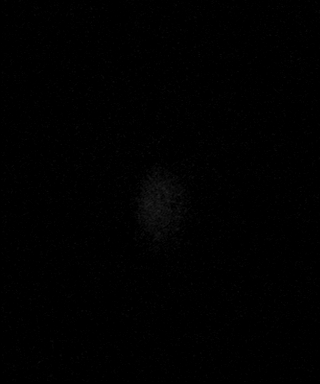

[Series 18: T1 post-contrast · coronal · 5.0mm · 0.41mm/px · 4 of 24 slices shown (4 of 4)]
[im 1/24]
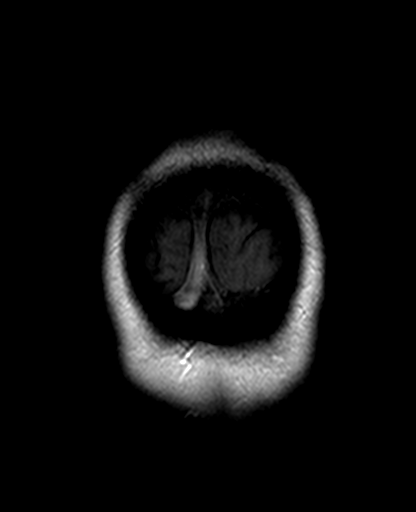
[im 8/24]
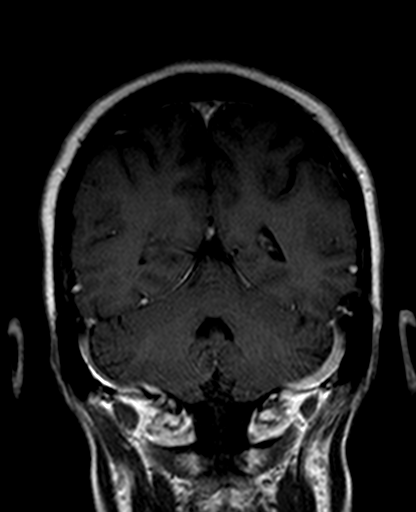
[im 16/24]
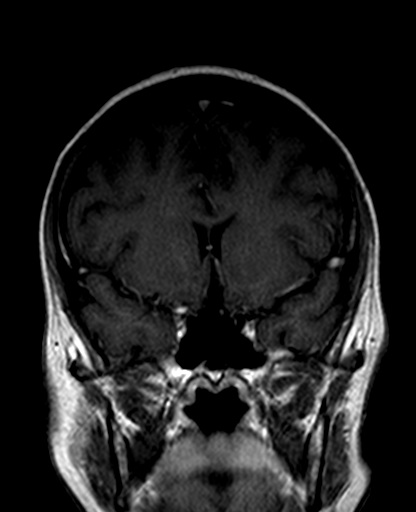
[im 24/24]
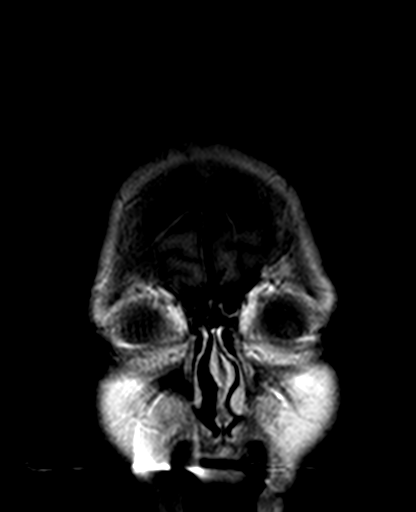

[29 of 48 positions shown; findings below may reference images not displayed]

FINDINGS: The brainstem and cerebellum are normal.  The cerebral
hemispheres are normal without evidence of malformation, old or
acute infarction, mass lesion, hemorrhage, hydrocephalus or extra-
axial collection.  No evidence of demyelinating disease.

The pituitary gland is normal in size shape and position.  The
gland measures 11 mm front to back, 5 mm cephalocaudal and 12 mm
right to left.  The infundibulum is midline.  After contrast
administration, enhancement is homogeneous.  No suprasellar lesion.

No inflammatory sinus disease.  No skull or skull base lesion.
IMPRESSION: Normal examination.  No lesion of the pituitary gland or
hypothalamus identified.

## 2011-03-11 ENCOUNTER — Other Ambulatory Visit (HOSPITAL_COMMUNITY): Payer: Self-pay | Admitting: Internal Medicine

## 2011-03-11 DIAGNOSIS — G8929 Other chronic pain: Secondary | ICD-10-CM

## 2011-03-15 ENCOUNTER — Ambulatory Visit (HOSPITAL_COMMUNITY)
Admission: RE | Admit: 2011-03-15 | Discharge: 2011-03-15 | Disposition: A | Discharge status: Home / Self Care | Payer: BC Managed Care – PPO | Source: Ambulatory Visit | Attending: Internal Medicine | Admitting: Internal Medicine

## 2011-03-15 DIAGNOSIS — M51379 Other intervertebral disc degeneration, lumbosacral region without mention of lumbar back pain or lower extremity pain: Secondary | ICD-10-CM | POA: Insufficient documentation

## 2011-03-15 DIAGNOSIS — G8929 Other chronic pain: Secondary | ICD-10-CM

## 2011-03-15 DIAGNOSIS — M545 Low back pain, unspecified: Secondary | ICD-10-CM | POA: Insufficient documentation

## 2011-03-15 DIAGNOSIS — M5137 Other intervertebral disc degeneration, lumbosacral region: Secondary | ICD-10-CM | POA: Insufficient documentation

## 2011-03-15 DIAGNOSIS — M5126 Other intervertebral disc displacement, lumbar region: Secondary | ICD-10-CM | POA: Insufficient documentation

## 2011-03-19 ENCOUNTER — Telehealth: Payer: Self-pay | Admitting: *Deleted

## 2011-03-19 ENCOUNTER — Encounter: Payer: Self-pay | Admitting: *Deleted

## 2011-03-19 NOTE — Telephone Encounter (Signed)
Have been trying to reach pt since 6/4 with results from heart monitor, NSR  w/occasional PVCs, but she has never returned call, letter mailed

## 2011-03-23 ENCOUNTER — Telehealth: Payer: Self-pay | Admitting: Internal Medicine

## 2011-03-23 NOTE — Telephone Encounter (Signed)
Pt called regarding the letter she received about holter results.  Please call her back.

## 2011-03-24 NOTE — Telephone Encounter (Signed)
Left message to call back  

## 2011-03-24 NOTE — Telephone Encounter (Signed)
Pt aware of monitor results. 

## 2011-03-25 ENCOUNTER — Emergency Department (HOSPITAL_COMMUNITY): Payer: BC Managed Care – PPO

## 2011-03-25 ENCOUNTER — Emergency Department (HOSPITAL_COMMUNITY)
Admission: EM | Admit: 2011-03-25 | Discharge: 2011-03-25 | Disposition: A | Discharge status: Home / Self Care | Payer: BC Managed Care – PPO | Attending: Emergency Medicine | Admitting: Emergency Medicine

## 2011-03-25 DIAGNOSIS — R197 Diarrhea, unspecified: Secondary | ICD-10-CM | POA: Insufficient documentation

## 2011-03-25 DIAGNOSIS — L2989 Other pruritus: Secondary | ICD-10-CM | POA: Insufficient documentation

## 2011-03-25 DIAGNOSIS — E860 Dehydration: Secondary | ICD-10-CM | POA: Insufficient documentation

## 2011-03-25 DIAGNOSIS — L298 Other pruritus: Secondary | ICD-10-CM | POA: Insufficient documentation

## 2011-03-25 DIAGNOSIS — E876 Hypokalemia: Secondary | ICD-10-CM | POA: Insufficient documentation

## 2011-03-25 DIAGNOSIS — I509 Heart failure, unspecified: Secondary | ICD-10-CM | POA: Insufficient documentation

## 2011-03-25 DIAGNOSIS — R42 Dizziness and giddiness: Secondary | ICD-10-CM | POA: Insufficient documentation

## 2011-03-25 DIAGNOSIS — E2749 Other adrenocortical insufficiency: Secondary | ICD-10-CM | POA: Insufficient documentation

## 2011-03-25 DIAGNOSIS — R5381 Other malaise: Secondary | ICD-10-CM | POA: Insufficient documentation

## 2011-03-25 DIAGNOSIS — Z79899 Other long term (current) drug therapy: Secondary | ICD-10-CM | POA: Insufficient documentation

## 2011-03-25 DIAGNOSIS — M549 Dorsalgia, unspecified: Secondary | ICD-10-CM | POA: Insufficient documentation

## 2011-03-25 DIAGNOSIS — R059 Cough, unspecified: Secondary | ICD-10-CM | POA: Insufficient documentation

## 2011-03-25 DIAGNOSIS — R112 Nausea with vomiting, unspecified: Secondary | ICD-10-CM | POA: Insufficient documentation

## 2011-03-25 DIAGNOSIS — L98499 Non-pressure chronic ulcer of skin of other sites with unspecified severity: Secondary | ICD-10-CM | POA: Insufficient documentation

## 2011-03-25 DIAGNOSIS — R1012 Left upper quadrant pain: Secondary | ICD-10-CM | POA: Insufficient documentation

## 2011-03-25 DIAGNOSIS — J189 Pneumonia, unspecified organism: Secondary | ICD-10-CM | POA: Insufficient documentation

## 2011-03-25 DIAGNOSIS — G8929 Other chronic pain: Secondary | ICD-10-CM | POA: Insufficient documentation

## 2011-03-25 DIAGNOSIS — R630 Anorexia: Secondary | ICD-10-CM | POA: Insufficient documentation

## 2011-03-25 DIAGNOSIS — R079 Chest pain, unspecified: Secondary | ICD-10-CM | POA: Insufficient documentation

## 2011-03-25 DIAGNOSIS — R05 Cough: Secondary | ICD-10-CM | POA: Insufficient documentation

## 2011-03-25 LAB — DIFFERENTIAL
Basophils Absolute: 0.1 10*3/uL (ref 0.0–0.1)
Basophils Relative: 1 % (ref 0–1)
Eosinophils Absolute: 0.2 10*3/uL (ref 0.0–0.7)
Monocytes Relative: 7 % (ref 3–12)
Neutro Abs: 6.1 10*3/uL (ref 1.7–7.7)
Neutrophils Relative %: 66 % (ref 43–77)

## 2011-03-25 LAB — COMPREHENSIVE METABOLIC PANEL
ALT: 14 U/L (ref 0–35)
AST: 19 U/L (ref 0–37)
Albumin: 3 g/dL — ABNORMAL LOW (ref 3.5–5.2)
CO2: 26 mEq/L (ref 19–32)
Chloride: 103 mEq/L (ref 96–112)
Creatinine, Ser: 0.69 mg/dL (ref 0.50–1.10)
GFR calc non Af Amer: >60 mL/min (ref >60)
Sodium: 141 mEq/L (ref 135–145)
Total Bilirubin: 0.1 mg/dL — ABNORMAL LOW (ref 0.3–1.2)

## 2011-03-25 LAB — URINALYSIS, ROUTINE W REFLEX MICROSCOPIC
Glucose, UA: NEGATIVE mg/dL
Leukocytes, UA: NEGATIVE
Nitrite: NEGATIVE
Specific Gravity, Urine: 1.025 (ref 1.005–1.030)
pH: 6 (ref 5.0–8.0)

## 2011-03-25 LAB — CK TOTAL AND CKMB (NOT AT ARMC): Total CK: 76 U/L (ref 7–177)

## 2011-03-25 LAB — CBC
Hemoglobin: 12.4 g/dL (ref 12.0–15.0)
Platelets: 350 10*3/uL (ref 150–400)
RBC: 4.31 MIL/uL (ref 3.87–5.11)
WBC: 9.3 10*3/uL (ref 4.0–10.5)

## 2011-03-26 ENCOUNTER — Emergency Department (HOSPITAL_COMMUNITY)
Admission: EM | Admit: 2011-03-26 | Discharge: 2011-03-26 | Disposition: A | Discharge status: Home / Self Care | Payer: BC Managed Care – PPO | Attending: Emergency Medicine | Admitting: Emergency Medicine

## 2011-03-26 ENCOUNTER — Encounter (HOSPITAL_COMMUNITY): Payer: Self-pay

## 2011-03-26 ENCOUNTER — Other Ambulatory Visit: Payer: Self-pay

## 2011-03-26 DIAGNOSIS — I10 Essential (primary) hypertension: Secondary | ICD-10-CM | POA: Insufficient documentation

## 2011-03-26 DIAGNOSIS — R1084 Generalized abdominal pain: Secondary | ICD-10-CM | POA: Insufficient documentation

## 2011-03-26 DIAGNOSIS — M549 Dorsalgia, unspecified: Secondary | ICD-10-CM | POA: Insufficient documentation

## 2011-03-26 DIAGNOSIS — F411 Generalized anxiety disorder: Secondary | ICD-10-CM | POA: Insufficient documentation

## 2011-03-26 DIAGNOSIS — R079 Chest pain, unspecified: Secondary | ICD-10-CM | POA: Insufficient documentation

## 2011-03-26 DIAGNOSIS — I2510 Atherosclerotic heart disease of native coronary artery without angina pectoris: Secondary | ICD-10-CM | POA: Insufficient documentation

## 2011-03-26 DIAGNOSIS — Z87891 Personal history of nicotine dependence: Secondary | ICD-10-CM | POA: Insufficient documentation

## 2011-03-26 DIAGNOSIS — I509 Heart failure, unspecified: Secondary | ICD-10-CM | POA: Insufficient documentation

## 2011-03-26 DIAGNOSIS — E039 Hypothyroidism, unspecified: Secondary | ICD-10-CM | POA: Insufficient documentation

## 2011-03-26 DIAGNOSIS — Z79899 Other long term (current) drug therapy: Secondary | ICD-10-CM | POA: Insufficient documentation

## 2011-03-26 DIAGNOSIS — G8929 Other chronic pain: Secondary | ICD-10-CM | POA: Insufficient documentation

## 2011-03-26 DIAGNOSIS — E785 Hyperlipidemia, unspecified: Secondary | ICD-10-CM | POA: Insufficient documentation

## 2011-03-26 DIAGNOSIS — R0602 Shortness of breath: Secondary | ICD-10-CM | POA: Insufficient documentation

## 2011-03-26 LAB — BASIC METABOLIC PANEL
BUN: 7 mg/dL (ref 6–23)
CO2: 28 mEq/L (ref 19–32)
Chloride: 106 mEq/L (ref 96–112)
Creatinine, Ser: 0.66 mg/dL (ref 0.50–1.10)
Glucose, Bld: 115 mg/dL — ABNORMAL HIGH (ref 70–99)
Potassium: 3.9 mEq/L (ref 3.5–5.1)

## 2011-03-26 MED ORDER — PROMETHAZINE HCL 25 MG/ML IJ SOLN
25.0000 mg | Freq: Once | INTRAMUSCULAR | Status: AC
  Administered 2011-03-26: 25 mg via INTRAMUSCULAR
  Filled 2011-03-26: qty 1

## 2011-03-26 MED ORDER — FENTANYL CITRATE 0.05 MG/ML IJ SOLN
50.0000 ug | Freq: Once | INTRAMUSCULAR | Status: AC
  Administered 2011-03-26: 50 ug via INTRAMUSCULAR
  Filled 2011-03-26: qty 2

## 2011-03-26 NOTE — ED Notes (Signed)
H.Bryant in room with pt 

## 2011-03-26 NOTE — ED Notes (Signed)
Pt presents with SOB and abdominal pain. Pt sen at [facility] yesterday and was Dx with community acquired Pneumonia. Pt states symptoms started yesterday. Pt to triage via w/c. NAD at this time.

## 2011-03-26 NOTE — ED Notes (Signed)
Pt reports went to [facility] yesterday for rash on left forearm.  Says was being evaluated at [facility] and was told had pneumonia.  Pt c/o headache, chest pain, rib pain, abd pain, productive cough with green sputum, weakness, lightheadedness.  Says these symptoms started developing while in the hospital yesterday.  Also says feels like heart speeds up every time she gets up to do anything.

## 2011-03-26 NOTE — Progress Notes (Signed)
Pain and nausea improved. Test results given to pt.

## 2011-03-26 NOTE — ED Provider Notes (Signed)
History     Chief Complaint  Patient presents with  . Shortness of Breath  . Abdominal Pain   Patient is a 45 y.o. female presenting with abdominal pain.  Abdominal Pain The primary symptoms of the illness include abdominal pain.    Past Medical History  Diagnosis Date  . Cardiomyopathy     resolved  . Chest pain     chronicc  . Hyperlipidemia   . HTN (hypertension)   . Hypothyroidism   . Adrenal insufficiency   . Anxiety   . Nondiabetic gastroparesis   . CAD (coronary artery disease)     Cath 2008 EF normal. RCA 50-60, Septal 50%. Myoview 3/12: EF 53% normal perfusion  . Chronic back pain   . D (diarrhea)   . History of CHF (congestive heart failure)   . QT prolongation     Past Surgical History  Procedure Date  . Cardiac catheterization 03/2007    showed 60% lesion in the right coronary artery  . Cholecystectomy   . Spine surgery   . Vesicovaginal fistula closure w/ tah   . Varicose vein surgery     Family History  Problem Relation Age of Onset  . Coronary artery disease    . Heart attack Mother   . Cancer Father     Colon    History  Substance Use Topics  . Smoking status: Former Smoker    Types: Cigarettes  . Smokeless tobacco: Not on file   Comment: smokes 6-7 cigarettes per day  . Alcohol Use: No    OB History    Grav Para Term Preterm Abortions TAB SAB Ect Mult Living                  Review of Systems  Gastrointestinal: Positive for abdominal pain.    Physical Exam  BP 134/68  Pulse 65  Temp(Src) 97.9 F (36.6 C) (Oral)  Resp 20  Ht 5\' 5"  (1.651 m)  Wt 167 lb (75.751 kg)  BMI 27.79 kg/m2  SpO2 98%  Physical Exam  ED Course  Procedures  MDM  Date: 03/26/2011  Rate: 71  Rhythm: normal sinus rhythm  QRS Axis: normal  Intervals: normal  ST/T Wave abnormalities: normal  Conduction Disutrbances:none  Narrative Interpretation:   Old EKG Reviewed: changes noted was in ST with rate 105 on 12/31/2010 Devoria Albe, MD,  FACEP    Medical screening examination/treatment/procedure(s) were performed by non-physician practitioner and as supervising physician I was immediately available for consultation/collaboration.  Devoria Albe, MD, FACEP     Ward Givens, MD 03/27/11 [phone]

## 2011-04-30 ENCOUNTER — Emergency Department (HOSPITAL_COMMUNITY): Payer: BC Managed Care – PPO

## 2011-04-30 ENCOUNTER — Encounter (HOSPITAL_COMMUNITY): Payer: Self-pay | Admitting: *Deleted

## 2011-04-30 ENCOUNTER — Emergency Department (HOSPITAL_COMMUNITY)
Admission: EM | Admit: 2011-04-30 | Discharge: 2011-04-30 | Disposition: A | Discharge status: Home / Self Care | Payer: BC Managed Care – PPO | Attending: Emergency Medicine | Admitting: Emergency Medicine

## 2011-04-30 DIAGNOSIS — Z87891 Personal history of nicotine dependence: Secondary | ICD-10-CM | POA: Insufficient documentation

## 2011-04-30 DIAGNOSIS — Z79899 Other long term (current) drug therapy: Secondary | ICD-10-CM | POA: Insufficient documentation

## 2011-04-30 DIAGNOSIS — I2510 Atherosclerotic heart disease of native coronary artery without angina pectoris: Secondary | ICD-10-CM | POA: Insufficient documentation

## 2011-04-30 DIAGNOSIS — Y92009 Unspecified place in unspecified non-institutional (private) residence as the place of occurrence of the external cause: Secondary | ICD-10-CM | POA: Insufficient documentation

## 2011-04-30 DIAGNOSIS — M549 Dorsalgia, unspecified: Secondary | ICD-10-CM | POA: Insufficient documentation

## 2011-04-30 DIAGNOSIS — E785 Hyperlipidemia, unspecified: Secondary | ICD-10-CM | POA: Insufficient documentation

## 2011-04-30 DIAGNOSIS — Z7982 Long term (current) use of aspirin: Secondary | ICD-10-CM | POA: Insufficient documentation

## 2011-04-30 DIAGNOSIS — I10 Essential (primary) hypertension: Secondary | ICD-10-CM | POA: Insufficient documentation

## 2011-04-30 DIAGNOSIS — T148XXA Other injury of unspecified body region, initial encounter: Secondary | ICD-10-CM

## 2011-04-30 DIAGNOSIS — I509 Heart failure, unspecified: Secondary | ICD-10-CM | POA: Insufficient documentation

## 2011-04-30 DIAGNOSIS — S90129A Contusion of unspecified lesser toe(s) without damage to nail, initial encounter: Secondary | ICD-10-CM | POA: Insufficient documentation

## 2011-04-30 DIAGNOSIS — W010XXA Fall on same level from slipping, tripping and stumbling without subsequent striking against object, initial encounter: Secondary | ICD-10-CM | POA: Insufficient documentation

## 2011-04-30 MED ORDER — HYDROCODONE-ACETAMINOPHEN 5-325 MG PO TABS: 1.0000 | ORAL_TABLET | ORAL | Status: DC | PRN

## 2011-04-30 MED ORDER — OXYCODONE-ACETAMINOPHEN 5-325 MG PO TABS
2.0000 | ORAL_TABLET | Freq: Once | ORAL | Status: AC
  Administered 2011-04-30: 2 via ORAL
  Filled 2011-04-30: qty 2

## 2011-04-30 MED ORDER — IBUPROFEN 600 MG PO TABS: 600.0000 mg | ORAL_TABLET | Freq: Four times a day (QID) | ORAL | Status: DC | PRN

## 2011-04-30 NOTE — ED Notes (Signed)
ACE wrap applied to left foot. Cap refill under 3 seconds. Crutches teaching provided. Pt verbalized understanding.

## 2011-04-30 NOTE — ED Provider Notes (Signed)
History     CSN: [id number] Arrival date & time: 04/30/2011  9:13 PM  Chief Complaint  Patient presents with  . Foot Injury   Patient is a 45 y.o. female presenting with foot injury. The history is provided by the patient.  Foot Injury  The incident occurred 2 days ago. The incident occurred at home. The injury mechanism was a fall (she slipped on a rug,  with her left foot hitting a piece of furniture,  causing pain and bruising to her left 4th and 5th toes.). The pain is present in the left toes. The quality of the pain is described as sharp and throbbing. The pain is at a severity of 4/10. The pain is moderate. The pain has been constant since onset. Associated symptoms include inability to bear weight. Pertinent negatives include no numbness, no loss of motion and no loss of sensation. Associated symptoms comments: She has been walking on the inside of her foot to avoid weight bearing on the lateral aspect of the foot.. The symptoms are aggravated by bearing weight and palpation. Treatments tried: She is using her home oxycodone with intermittent relief of pain.    Past Medical History  Diagnosis Date  . Cardiomyopathy     resolved  . Chest pain     chronicc  . Hyperlipidemia   . HTN (hypertension)   . Hypothyroidism   . Adrenal insufficiency   . Anxiety   . Nondiabetic gastroparesis   . CAD (coronary artery disease)     Cath 2008 EF normal. RCA 50-60, Septal 50%. Myoview 3/12: EF 53% normal perfusion  . Chronic back pain   . D (diarrhea)   . History of CHF (congestive heart failure)   . QT prolongation     Past Surgical History  Procedure Date  . Cardiac catheterization 03/2007    showed 60% lesion in the right coronary artery  . Cholecystectomy   . Spine surgery   . Vesicovaginal fistula closure w/ tah   . Varicose vein surgery     Family History  Problem Relation Age of Onset  . Coronary artery disease    . Heart attack Mother   . Cancer Father     Colon     History  Substance Use Topics  . Smoking status: Former Smoker    Types: Cigarettes  . Smokeless tobacco: Not on file   Comment: smokes 6-7 cigarettes per day  . Alcohol Use: No    OB History    Grav Para Term Preterm Abortions TAB SAB Ect Mult Living                  Review of Systems  Neurological: Negative for numbness.  All other systems reviewed and are negative.    Physical Exam  BP 134/78  Pulse 98  Temp(Src) 98.6 F (37 C) (Oral)  Resp 18  Ht 5\' 5"  (1.651 m)  Wt 159 lb (72.122 kg)  BMI 26.46 kg/m2  SpO2 100%  Physical Exam  Nursing note and vitals reviewed. Constitutional: She is oriented to person, place, and time. She appears well-developed and well-nourished.  HENT:  Head: Normocephalic.  Eyes: Conjunctivae are normal.  Neck: Normal range of motion.  Cardiovascular: Normal rate and intact distal pulses.  Exam reveals no decreased pulses.   Pulses:      Dorsalis pedis pulses are 2+ on the right side, and 2+ on the left side.       Posterior tibial pulses are 2+ on  the right side, and 2+ on the left side.  Pulmonary/Chest: Effort normal.  Musculoskeletal: She exhibits edema and tenderness.       Right ankle: Achilles tendon normal.       Left foot: She exhibits tenderness and bony tenderness. She exhibits normal range of motion, no swelling, normal capillary refill and no crepitus.       Ecchymosis noted base of 4th and 5th toes.  Neurological: She is alert and oriented to person, place, and time. No sensory deficit.  Skin: Skin is warm, dry and intact.    ED Course  Procedures  Ace wrap,  Crutches supplied by RN. MDM Contusion. Patients labs and/or radiological studies were reviewed during the medical decision making and disposition process.       Candis Musa, PA 05/01/11 0001

## 2011-04-30 NOTE — ED Notes (Signed)
Pt reports 2 days ago she jammed her left foot on a door, pt c/o pain to toes and top of foot

## 2011-05-03 NOTE — ED Provider Notes (Signed)
History/physical exam/procedure(s) were performed by non-physician practitioner and as supervising physician I was immediately available for consultation/collaboration. I have reviewed all notes and am in agreement with care and plan.   Hilario Quarry, MD 05/03/11 [phone]

## 2011-06-08 ENCOUNTER — Ambulatory Visit (HOSPITAL_COMMUNITY)
Admission: RE | Admit: 2011-06-08 | Discharge: 2011-06-08 | Disposition: A | Discharge status: Home / Self Care | Payer: BC Managed Care – PPO | Source: Ambulatory Visit | Attending: Internal Medicine | Admitting: Internal Medicine

## 2011-06-08 ENCOUNTER — Other Ambulatory Visit (HOSPITAL_COMMUNITY): Payer: Self-pay | Admitting: Internal Medicine

## 2011-06-08 DIAGNOSIS — M25549 Pain in joints of unspecified hand: Secondary | ICD-10-CM | POA: Insufficient documentation

## 2011-06-08 DIAGNOSIS — M79643 Pain in unspecified hand: Secondary | ICD-10-CM

## 2011-06-08 DIAGNOSIS — S6991XA Unspecified injury of right wrist, hand and finger(s), initial encounter: Secondary | ICD-10-CM

## 2011-06-08 DIAGNOSIS — X58XXXA Exposure to other specified factors, initial encounter: Secondary | ICD-10-CM | POA: Insufficient documentation

## 2011-06-08 DIAGNOSIS — S6990XA Unspecified injury of unspecified wrist, hand and finger(s), initial encounter: Secondary | ICD-10-CM | POA: Insufficient documentation

## 2011-06-16 ENCOUNTER — Encounter (HOSPITAL_COMMUNITY): Payer: Self-pay | Admitting: Emergency Medicine

## 2011-06-16 ENCOUNTER — Emergency Department (HOSPITAL_COMMUNITY)
Admission: EM | Admit: 2011-06-16 | Discharge: 2011-06-16 | Disposition: A | Discharge status: Home / Self Care | Payer: BC Managed Care – PPO | Attending: Emergency Medicine | Admitting: Emergency Medicine

## 2011-06-16 DIAGNOSIS — I10 Essential (primary) hypertension: Secondary | ICD-10-CM | POA: Insufficient documentation

## 2011-06-16 DIAGNOSIS — S161XXA Strain of muscle, fascia and tendon at neck level, initial encounter: Secondary | ICD-10-CM

## 2011-06-16 DIAGNOSIS — S139XXA Sprain of joints and ligaments of unspecified parts of neck, initial encounter: Secondary | ICD-10-CM | POA: Insufficient documentation

## 2011-06-16 DIAGNOSIS — IMO0002 Reserved for concepts with insufficient information to code with codable children: Secondary | ICD-10-CM | POA: Insufficient documentation

## 2011-06-16 DIAGNOSIS — S20229A Contusion of unspecified back wall of thorax, initial encounter: Secondary | ICD-10-CM

## 2011-06-16 DIAGNOSIS — F17200 Nicotine dependence, unspecified, uncomplicated: Secondary | ICD-10-CM | POA: Insufficient documentation

## 2011-06-16 DIAGNOSIS — I428 Other cardiomyopathies: Secondary | ICD-10-CM | POA: Insufficient documentation

## 2011-06-16 DIAGNOSIS — I2510 Atherosclerotic heart disease of native coronary artery without angina pectoris: Secondary | ICD-10-CM | POA: Insufficient documentation

## 2011-06-16 DIAGNOSIS — W1809XA Striking against other object with subsequent fall, initial encounter: Secondary | ICD-10-CM | POA: Insufficient documentation

## 2011-06-16 DIAGNOSIS — M542 Cervicalgia: Secondary | ICD-10-CM | POA: Insufficient documentation

## 2011-06-16 DIAGNOSIS — I509 Heart failure, unspecified: Secondary | ICD-10-CM | POA: Insufficient documentation

## 2011-06-16 DIAGNOSIS — R079 Chest pain, unspecified: Secondary | ICD-10-CM | POA: Insufficient documentation

## 2011-06-16 DIAGNOSIS — Z7982 Long term (current) use of aspirin: Secondary | ICD-10-CM | POA: Insufficient documentation

## 2011-06-16 DIAGNOSIS — S20219A Contusion of unspecified front wall of thorax, initial encounter: Secondary | ICD-10-CM | POA: Insufficient documentation

## 2011-06-16 DIAGNOSIS — E785 Hyperlipidemia, unspecified: Secondary | ICD-10-CM | POA: Insufficient documentation

## 2011-06-16 DIAGNOSIS — E039 Hypothyroidism, unspecified: Secondary | ICD-10-CM | POA: Insufficient documentation

## 2011-06-16 MED ORDER — ONDANSETRON 4 MG PO TBDP
4.0000 mg | ORAL_TABLET | Freq: Once | ORAL | Status: AC
Start: 2011-06-16 — End: 2011-06-16
  Administered 2011-06-16: 4 mg via ORAL
  Filled 2011-06-16: qty 1

## 2011-06-16 MED ORDER — HYDROMORPHONE HCL 2 MG/ML IJ SOLN
2.0000 mg | Freq: Once | INTRAMUSCULAR | Status: AC
  Administered 2011-06-16: 2 mg via INTRAMUSCULAR
  Filled 2011-06-16: qty 1

## 2011-06-16 NOTE — ED Notes (Signed)
Pt states she was standing in a chair to tap a light and lost her balance. Pt states she fell backwards striking her rt lower back, rt wrist, and upper back.

## 2011-06-16 NOTE — ED Provider Notes (Signed)
History   Chart scribed for [name]. Joy Flinchum, MD by Enos Fling; the patient was seen in room APA05/APA05; this patient's care was started at 7:15 AM.    CSN: [id number] Arrival date & time: 06/16/2011  7:07 AM  Chief Complaint  Patient presents with  . Fall  . Back Pain   HPI [name] is a 45 y.o. female who presents to the Emergency Department s/p fall. Pt states she lost her balance while standing on a chair and fell backward, landing on hard floor and hitting her right side against a wheelchair. Pt c/o pain to her right shoulder and right back. Pt took her oxycodone (for chronic pain) with mild relief of this pain. Pt was wearing a brace on her right wrist d/t pain from a fall last week, still c/o mild pain though unchanged from pain prior to today's fall. Also reports chronic neck pain, also unchanged from baseline. No head injury, LOC, numbness, tingling, or weakness.      Past Medical History  Diagnosis Date  . Cardiomyopathy     resolved  . Chest pain     chronicc  . Hyperlipidemia   . HTN (hypertension)   . Hypothyroidism   . Adrenal insufficiency   . Anxiety   . Nondiabetic gastroparesis   . CAD (coronary artery disease)     Cath 2008 EF normal. RCA 50-60, Septal 50%. Myoview 3/12: EF 53% normal perfusion  . Chronic back pain   . D (diarrhea)   . History of CHF (congestive heart failure)   . QT prolongation   . Addison disease     Past Surgical History  Procedure Date  . Cardiac catheterization 03/2007    showed 60% lesion in the right coronary artery  . Cholecystectomy   . Spine surgery   . Vesicovaginal fistula closure w/ tah   . Varicose vein surgery   . Abdominal hysterectomy     Family History  Problem Relation Age of Onset  . Coronary artery disease    . Heart attack Mother   . Cancer Father     Colon    History  Substance Use Topics  . Smoking status: Current Everyday Smoker    Types: Cigarettes  . Smokeless tobacco: Not on file   Comment: smokes 6-7 cigarettes per day  . Alcohol Use: No    OB History    Grav Para Term Preterm Abortions TAB SAB Ect Mult Living                  Review of Systems  HENT: Positive for neck pain (chronic). Negative for neck stiffness.   Musculoskeletal: Positive for back pain.  Skin:       +abrasions  Neurological: Negative for numbness and headaches.  All other systems reviewed and are negative.      Allergies  Bee venom; Doxycycline; Dust mite extract; Erythromycin; Morphine and related; Penicillins; Sulfonamide derivatives; and Tramadol  Home Medications   Current Outpatient Rx  Name Route Sig Dispense Refill  . ASPIRIN 81 MG PO TABS Oral Take 81 mg by mouth daily.      . CYCLOBENZAPRINE HCL 10 MG PO TABS Oral Take 10 mg by mouth 3 (three) times daily as needed. For muscle spasms     . HYDROCORTISONE 10 MG PO TABS Oral Take 20 mg by mouth 2 (two) times daily. Take 2 tablets in the morning and 2 tablets in the evening     . LEVOTHYROXINE SODIUM 100  MCG PO TABS Oral Take 100 mcg by mouth daily.      Marland Kitchen LORAZEPAM 1 MG PO TABS Oral Take 1 mg by mouth 4 (four) times daily.      Marland Kitchen METOCLOPRAMIDE HCL 10 MG PO TABS Oral Take 20 mg by mouth 3 (three) times daily before meals. As needed    . ONDANSETRON HCL 8 MG PO TABS Oral Take 8 mg by mouth every 6 (six) hours as needed. For nausea    . OXYCODONE HCL 15 MG PO TABS Oral Take 15 mg by mouth every 6 (six) hours as needed. For pain    . PANTOPRAZOLE SODIUM 40 MG PO TBEC Oral Take 40 mg by mouth daily as needed.     Marland Kitchen POTASSIUM CHLORIDE CRYS CR 20 MEQ PO TBCR Oral Take 20 mEq by mouth 2 (two) times daily.      . RED YEAST RICE 600 MG PO TABS Oral Take 1 tablet (600 mg total) by mouth daily.    . TOBRAMYCIN-DEXAMETHASONE 0.3-0.1 % OP SUSP  1 drop every 4 (four) hours while awake.      Marland Kitchen ZOLPIDEM TARTRATE ER 12.5 MG PO TBCR Oral Take 10 mg by mouth at bedtime as needed.      Marland Kitchen ZOLPIDEM TARTRATE 10 MG PO TABS Oral Take 10 mg by mouth  at bedtime as needed. For sleep       BP 134/83  Pulse 101  Temp(Src) 97.8 F (36.6 C) (Oral)  Resp 18  Ht 5' 5.5" (1.664 m)  Wt 160 lb (72.576 kg)  BMI 26.22 kg/m2  SpO2 98%  Physical Exam  Constitutional: She is oriented to person, place, and time. She appears well-developed and well-nourished. No distress.  HENT:  Head: Normocephalic and atraumatic.  Eyes: Conjunctivae are normal.  Neck: Normal range of motion. Neck supple.  Pulmonary/Chest: Effort normal. No respiratory distress.  Musculoskeletal: Normal range of motion.       Tenderness to paraspinal thoracic area; abrasions right flank and right upper back just off midline, tenderness localized to these areas; no midline spinal tenderness; no neck stiffness; FROM BUE  Neurological: She is alert and oriented to person, place, and time.  Skin: Skin is warm and dry. No rash noted.  Psychiatric: She has a normal mood and affect.    ED Course  Procedures - none  Labs Reviewed - No data to display No results found.   MDM  Pt with clear abrasions to right flank and right upper back at posterior trapezius.  She has FROM of right shoulder.  No crepitus or tenderness along clavicle or AC joint.  She has paraspinal tenderenss along right mid thoracic back as well.  She can strain and lift her head up off the bed, rotate neck with no pain.  She verbalized some concern due to fact she had spinal fusion, however, there is no midline tenderness, all is paraspinal and she has no focal neuro deficits.  She requests xrays of her neck, back and shoulder.  Pt is reassured that she does not require xrays.  Analagesics provided along with antiemetic due to her emesis of her other meds due to her anxiety.  She seemed rather upset at my medical explanation and reasoning of why plain films were not indicated.  She seems incredulous in my ability to discern whether xrays are needed stating that "You are not God."  She then sits up under her own power.   Stands up, puts her clothes back on  and walks out with no apparent difficulty or weakness.  I reviewed her prior records.  Pt has chronic back and chest pain and takes oxycodone 15 mg twice daily at home.      IMPRESSION: 1. Contusion of back wall of thorax   2. Cervical strain, acute     SCRIBE ATTESTATION: I personally performed the services described in this documentation, which was scribed in my presence. The recorded information has been reviewed and considered.   [name]. Oletta Lamas, MD 06/16/11 [phone]

## 2011-06-16 NOTE — ED Notes (Signed)
Pt upset because she is not getting an x-ray. Dr. Oletta Lamas notified and in to speak with patient. Pt up and moving about in room with no difficulty. Able to get up off bed with no assistance to use phone.

## 2011-06-16 NOTE — Discharge Instructions (Signed)
 It is safe to take your own medications and to follow up with your regular doctor later this week or next week if you are not seeing gradual improvement.  Watch for numbness or weakness in arms or legs as signs as worsening nerve injury which is not present today, and if this occurs, please call your doctor and return to the emergency department for re-evaluation.

## 2011-06-16 NOTE — ED Notes (Signed)
Pt states that she fell off a chair this am and hit her back and right shoulder on a wheelchair. C/o pain in her upper back, lower back, neck and right shoulder. Pt has large abrasion to her mid back. Alert and oriented x 3. Skin warm and dry. Color pink. Able to move all extremities.

## 2011-06-17 ENCOUNTER — Emergency Department (HOSPITAL_COMMUNITY)
Admission: EM | Admit: 2011-06-17 | Discharge: 2011-06-17 | Disposition: A | Discharge status: Home / Self Care | Payer: BC Managed Care – PPO | Attending: Emergency Medicine | Admitting: Emergency Medicine

## 2011-06-17 ENCOUNTER — Emergency Department (HOSPITAL_COMMUNITY): Payer: BC Managed Care – PPO

## 2011-06-17 DIAGNOSIS — E2749 Other adrenocortical insufficiency: Secondary | ICD-10-CM | POA: Insufficient documentation

## 2011-06-17 DIAGNOSIS — Z981 Arthrodesis status: Secondary | ICD-10-CM | POA: Insufficient documentation

## 2011-06-17 DIAGNOSIS — E0590 Thyrotoxicosis, unspecified without thyrotoxic crisis or storm: Secondary | ICD-10-CM | POA: Insufficient documentation

## 2011-06-17 DIAGNOSIS — IMO0002 Reserved for concepts with insufficient information to code with codable children: Secondary | ICD-10-CM | POA: Insufficient documentation

## 2011-06-17 DIAGNOSIS — I10 Essential (primary) hypertension: Secondary | ICD-10-CM | POA: Insufficient documentation

## 2011-06-17 DIAGNOSIS — Y92009 Unspecified place in unspecified non-institutional (private) residence as the place of occurrence of the external cause: Secondary | ICD-10-CM | POA: Insufficient documentation

## 2011-06-17 DIAGNOSIS — Z79899 Other long term (current) drug therapy: Secondary | ICD-10-CM | POA: Insufficient documentation

## 2011-06-17 DIAGNOSIS — W1809XA Striking against other object with subsequent fall, initial encounter: Secondary | ICD-10-CM | POA: Insufficient documentation

## 2011-06-17 DIAGNOSIS — T1490XA Injury, unspecified, initial encounter: Secondary | ICD-10-CM | POA: Insufficient documentation

## 2011-06-17 DIAGNOSIS — M542 Cervicalgia: Secondary | ICD-10-CM | POA: Insufficient documentation

## 2011-06-17 DIAGNOSIS — M549 Dorsalgia, unspecified: Secondary | ICD-10-CM | POA: Insufficient documentation

## 2011-06-17 DIAGNOSIS — M79609 Pain in unspecified limb: Secondary | ICD-10-CM | POA: Insufficient documentation

## 2011-06-22 LAB — POCT I-STAT CREATININE: Creatinine, Ser: 0.8

## 2011-06-22 LAB — COMPREHENSIVE METABOLIC PANEL
ALT: 10
CO2: 26
Calcium: 8.6
Chloride: 106
GFR calc non Af Amer: >60
Glucose, Bld: 101 — ABNORMAL HIGH
Sodium: 137
Total Bilirubin: 0.7

## 2011-06-22 LAB — POCT CARDIAC MARKERS
CKMB, poc: <1 — ABNORMAL LOW
Troponin i, poc: <0.05

## 2011-06-22 LAB — CBC
HCT: 33.8 — ABNORMAL LOW
Hemoglobin: 11.6 — ABNORMAL LOW
Hemoglobin: 12.8
MCHC: 34.7
MCHC: 34.9
MCV: 86.9
Platelets: 311
Platelets: 326
RBC: 3.98
RBC: 4.27
RDW: 13.1
WBC: 8.1

## 2011-06-22 LAB — DIFFERENTIAL
Lymphocytes Relative: 27
Lymphs Abs: 2.9
Monocytes Absolute: 0.6
Monocytes Relative: 6
Neutro Abs: 7.2

## 2011-06-22 LAB — CK TOTAL AND CKMB (NOT AT ARMC): Relative Index: INVALID

## 2011-06-22 LAB — APTT: aPTT: 48 — ABNORMAL HIGH

## 2011-06-22 LAB — BASIC METABOLIC PANEL
BUN: 18
CO2: 25
Calcium: 8.7
Chloride: 105
GFR calc non Af Amer: >60
GFR calc non Af Amer: >60
Glucose, Bld: 101 — ABNORMAL HIGH
Potassium: 3.5
Potassium: 3.6
Sodium: 141

## 2011-06-22 LAB — CARDIAC PANEL(CRET KIN+CKTOT+MB+TROPI)
CK, MB: 0.7
CK, MB: 0.7
Total CK: 63
Total CK: 66

## 2011-06-22 LAB — I-STAT 8, (EC8 V) (CONVERTED LAB)
Acid-base deficit: 1
Bicarbonate: 24.3 — ABNORMAL HIGH
HCT: 53 — ABNORMAL HIGH
Operator id: 279831
pCO2, Ven: 43.3 — ABNORMAL LOW

## 2011-06-22 LAB — LIPID PANEL
Cholesterol: 190
LDL Cholesterol: 119 — ABNORMAL HIGH
Triglycerides: 230 — ABNORMAL HIGH

## 2011-06-22 LAB — D-DIMER, QUANTITATIVE: D-Dimer, Quant: <0.22

## 2011-06-22 LAB — TROPONIN I: Troponin I: 0.02

## 2011-06-22 LAB — PROTIME-INR: Prothrombin Time: 14.8

## 2011-07-23 NOTE — ED Provider Notes (Signed)
Medical screening examination/treatment/procedure(s) were performed by non-physician practitioner and as supervising physician I was immediately available for consultation/collaboration. Devoria Albe, MD, Armando Gang   Ward Givens, MD 07/23/11 [phone]

## 2011-07-23 NOTE — ED Provider Notes (Signed)
History     CSN: [id number] Arrival date & time: 03/26/2011  2:13 PM   None     Chief Complaint  Patient presents with  . Shortness of Breath  . Abdominal Pain    (Consider location/radiation/quality/duration/timing/severity/associated sxs/prior treatment) Patient is a 45 y.o. female presenting with shortness of breath and abdominal pain. The history is provided by the patient.  Shortness of Breath  The current episode started yesterday. The problem occurs frequently. The problem has been gradually worsening. The problem is moderate. The symptoms are relieved by nothing. The symptoms are aggravated by nothing. Associated symptoms include chest pain, cough and shortness of breath. Pertinent negatives include no orthopnea and no wheezing. Associated symptoms comments: Abd. pain. There was no intake of a foreign body. She was not exposed to toxic fumes. She is currently using steroids. Her past medical history is significant for past wheezing. Urine output has been normal. Recently, medical care has been given at another facility. Services received include medications given.  Abdominal Pain The primary symptoms of the illness include abdominal pain and shortness of breath. The primary symptoms of the illness do not include dysuria.  Symptoms associated with the illness do not include hematuria, frequency or back pain.    Past Medical History  Diagnosis Date  . Cardiomyopathy     resolved  . Chest pain     chronicc  . Hyperlipidemia   . HTN (hypertension)   . Hypothyroidism   . Adrenal insufficiency   . Anxiety   . Nondiabetic gastroparesis   . CAD (coronary artery disease)     Cath 2008 EF normal. RCA 50-60, Septal 50%. Myoview 3/12: EF 53% normal perfusion  . Chronic back pain   . D (diarrhea)   . History of CHF (congestive heart failure)   . QT prolongation   . Addison disease     Past Surgical History  Procedure Date  . Cardiac catheterization 03/2007    showed 60%  lesion in the right coronary artery  . Cholecystectomy   . Spine surgery   . Vesicovaginal fistula closure w/ tah   . Varicose vein surgery   . Abdominal hysterectomy     Family History  Problem Relation Age of Onset  . Coronary artery disease    . Heart attack Mother   . Cancer Father     Colon    History  Substance Use Topics  . Smoking status: Current Everyday Smoker    Types: Cigarettes  . Smokeless tobacco: Not on file   Comment: smokes 6-7 cigarettes per day  . Alcohol Use: No    OB History    Grav Para Term Preterm Abortions TAB SAB Ect Mult Living                  Review of Systems  Constitutional: Negative for activity change.       All ROS Neg except as noted in HPI  HENT: Negative for nosebleeds and neck pain.   Eyes: Negative for photophobia and discharge.  Respiratory: Positive for cough and shortness of breath. Negative for wheezing.   Cardiovascular: Positive for chest pain. Negative for palpitations and orthopnea.  Gastrointestinal: Positive for abdominal pain. Negative for blood in stool.  Genitourinary: Negative for dysuria, frequency and hematuria.  Musculoskeletal: Negative for back pain and arthralgias.  Skin: Negative.   Neurological: Negative for dizziness, seizures and speech difficulty.  Psychiatric/Behavioral: Negative for hallucinations and confusion.    Allergies  Bee venom; Doxycycline; Dust  mite extract; Erythromycin; Morphine and related; Penicillins; Sulfonamide derivatives; and Tramadol  Home Medications   Current Outpatient Rx  Name Route Sig Dispense Refill  . POTASSIUM CHLORIDE CRYS CR 20 MEQ PO TBCR Oral Take 20 mEq by mouth 2 (two) times daily.      Marland Kitchen ZOLPIDEM TARTRATE ER 12.5 MG PO TBCR Oral Take 10 mg by mouth at bedtime as needed.      . ASPIRIN 81 MG PO TABS Oral Take 81 mg by mouth daily.      . CYCLOBENZAPRINE HCL 10 MG PO TABS Oral Take 10 mg by mouth 3 (three) times daily as needed. For muscle spasms     .  HYDROCORTISONE 10 MG PO TABS Oral Take 20 mg by mouth 2 (two) times daily. Take 2 tablets in the morning and 2 tablets in the evening     . LEVOTHYROXINE SODIUM 100 MCG PO TABS Oral Take 100 mcg by mouth daily.      Marland Kitchen LORAZEPAM 1 MG PO TABS Oral Take 1 mg by mouth 4 (four) times daily.      Marland Kitchen METOCLOPRAMIDE HCL 10 MG PO TABS Oral Take 20 mg by mouth 3 (three) times daily before meals. As needed    . ONDANSETRON HCL 8 MG PO TABS Oral Take 8 mg by mouth every 6 (six) hours as needed. For nausea    . OXYCODONE HCL 15 MG PO TABS Oral Take 15 mg by mouth every 6 (six) hours as needed. For pain    . PANTOPRAZOLE SODIUM 40 MG PO TBEC Oral Take 40 mg by mouth daily as needed.     . RED YEAST RICE 600 MG PO TABS Oral Take 1 tablet (600 mg total) by mouth daily.    . TOBRAMYCIN-DEXAMETHASONE 0.3-0.1 % OP SUSP  1 drop every 4 (four) hours while awake.      Marland Kitchen ZOLPIDEM TARTRATE 10 MG PO TABS Oral Take 10 mg by mouth at bedtime as needed. For sleep       BP 137/80  Pulse 63  Temp(Src) 98.2 F (36.8 C) (Oral)  Resp 20  Ht 5\' 5"  (1.651 m)  Wt 167 lb (75.751 kg)  BMI 27.79 kg/m2  SpO2 96%  Physical Exam  Nursing note and vitals reviewed. Constitutional: She is oriented to person, place, and time. She appears well-developed and well-nourished.  Non-toxic appearance.  HENT:  Head: Normocephalic.  Right Ear: Tympanic membrane and external ear normal.  Left Ear: Tympanic membrane and external ear normal.  Eyes: EOM and lids are normal. Pupils are equal, round, and reactive to light.  Neck: Normal range of motion. Neck supple. Carotid bruit is not present.  Cardiovascular: Normal rate, regular rhythm, normal heart sounds, intact distal pulses and normal pulses.   Pulmonary/Chest: Breath sounds normal. No respiratory distress.  Abdominal: Soft. Bowel sounds are normal. There is tenderness.       Generallized abd pain. Good bowel sounds. No mass. No pulsating mass. No CVA tenderness.  Musculoskeletal:        Decrease ROM of the lower back. Pain with attempted ROM and palpation of the lumbar area.  Lymphadenopathy:       Head (right side): No submandibular adenopathy present.       Head (left side): No submandibular adenopathy present.    She has no cervical adenopathy.  Neurological: She is alert and oriented to person, place, and time. She has normal strength. No cranial nerve deficit or sensory deficit.  Skin: Skin  is warm and dry.  Psychiatric: She has a normal mood and affect. Her speech is normal.    ED Course  Procedures (including critical care time)  Labs Reviewed  BASIC METABOLIC PANEL - Abnormal; Notable for the following:    Glucose, Bld 115 (*)    All other components within normal limits  LAB REPORT - SCANNED   No results found.   1. Abdominal pain, generalized   2. Back pain, chronic       MDM  I have reviewed nursing notes, vital signs, and all appropriate lab and imaging results for this patient. Results for orders placed during the hospital encounter of 03/26/11  BASIC METABOLIC PANEL      Component Value Range   Sodium 139  135 - 145 (mEq/L)   Potassium 3.9  3.5 - 5.1 (mEq/L)   Chloride 106  96 - 112 (mEq/L)   CO2 28  19 - 32 (mEq/L)   Glucose, Bld 115 (*) 70 - 99 (mg/dL)   BUN 7  6 - 23 (mg/dL)   Creatinine, Ser 4.09  0.50 - 1.10 (mg/dL)   Calcium 8.5  8.4 - 81.1 (mg/dL)   GFR calc non Af Amer >60  >60 (mL/min)   GFR calc Af Amer >60  >60 (mL/min)   No results found.        Kathie Dike, [place] 07/23/11 [phone]

## 2011-08-12 IMAGING — CR DG CHEST 2V
2 series · 2 of 2 positions shown · non-contrast
Comparison: 06/28/2010

CLINICAL DATA: Chest pain.

CHEST - 2 VIEW

[w chest pa]
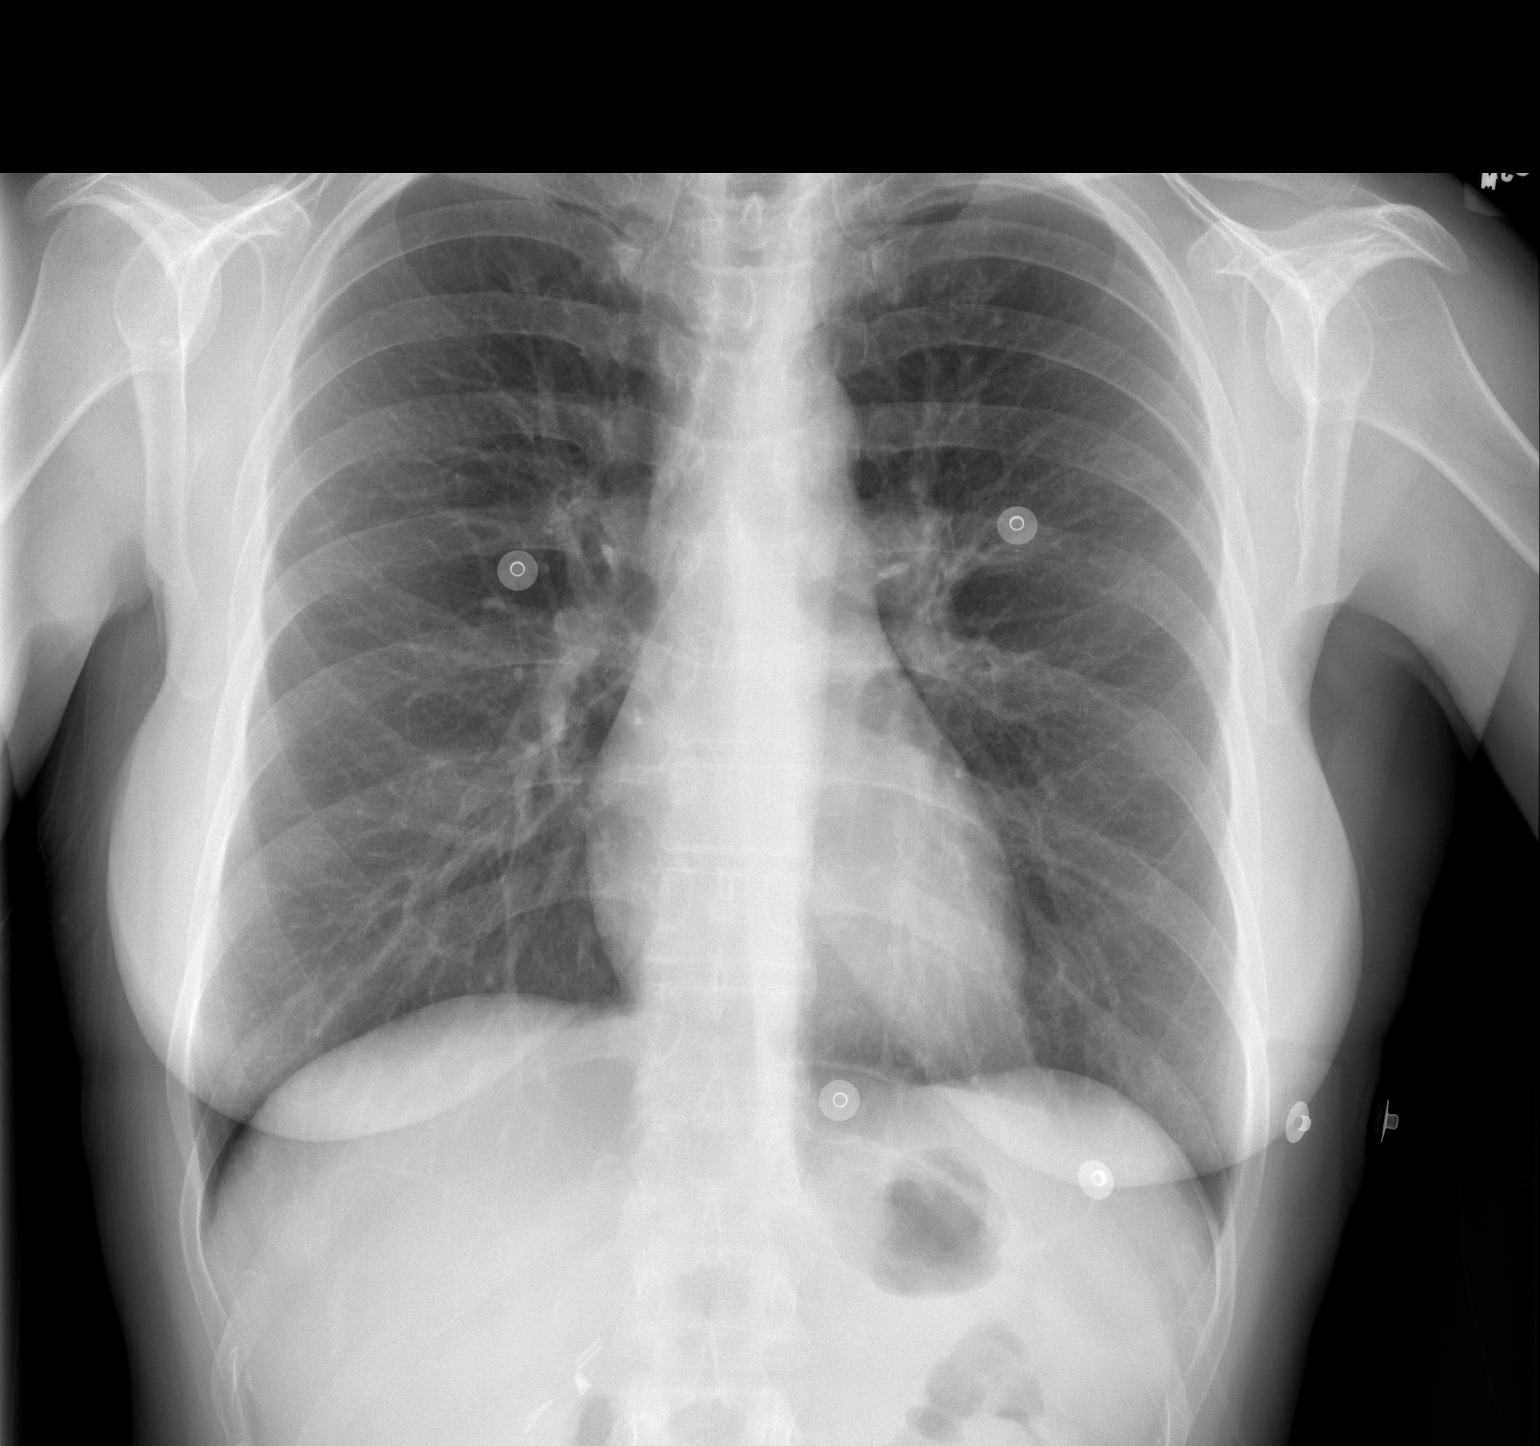

[w chest lat]
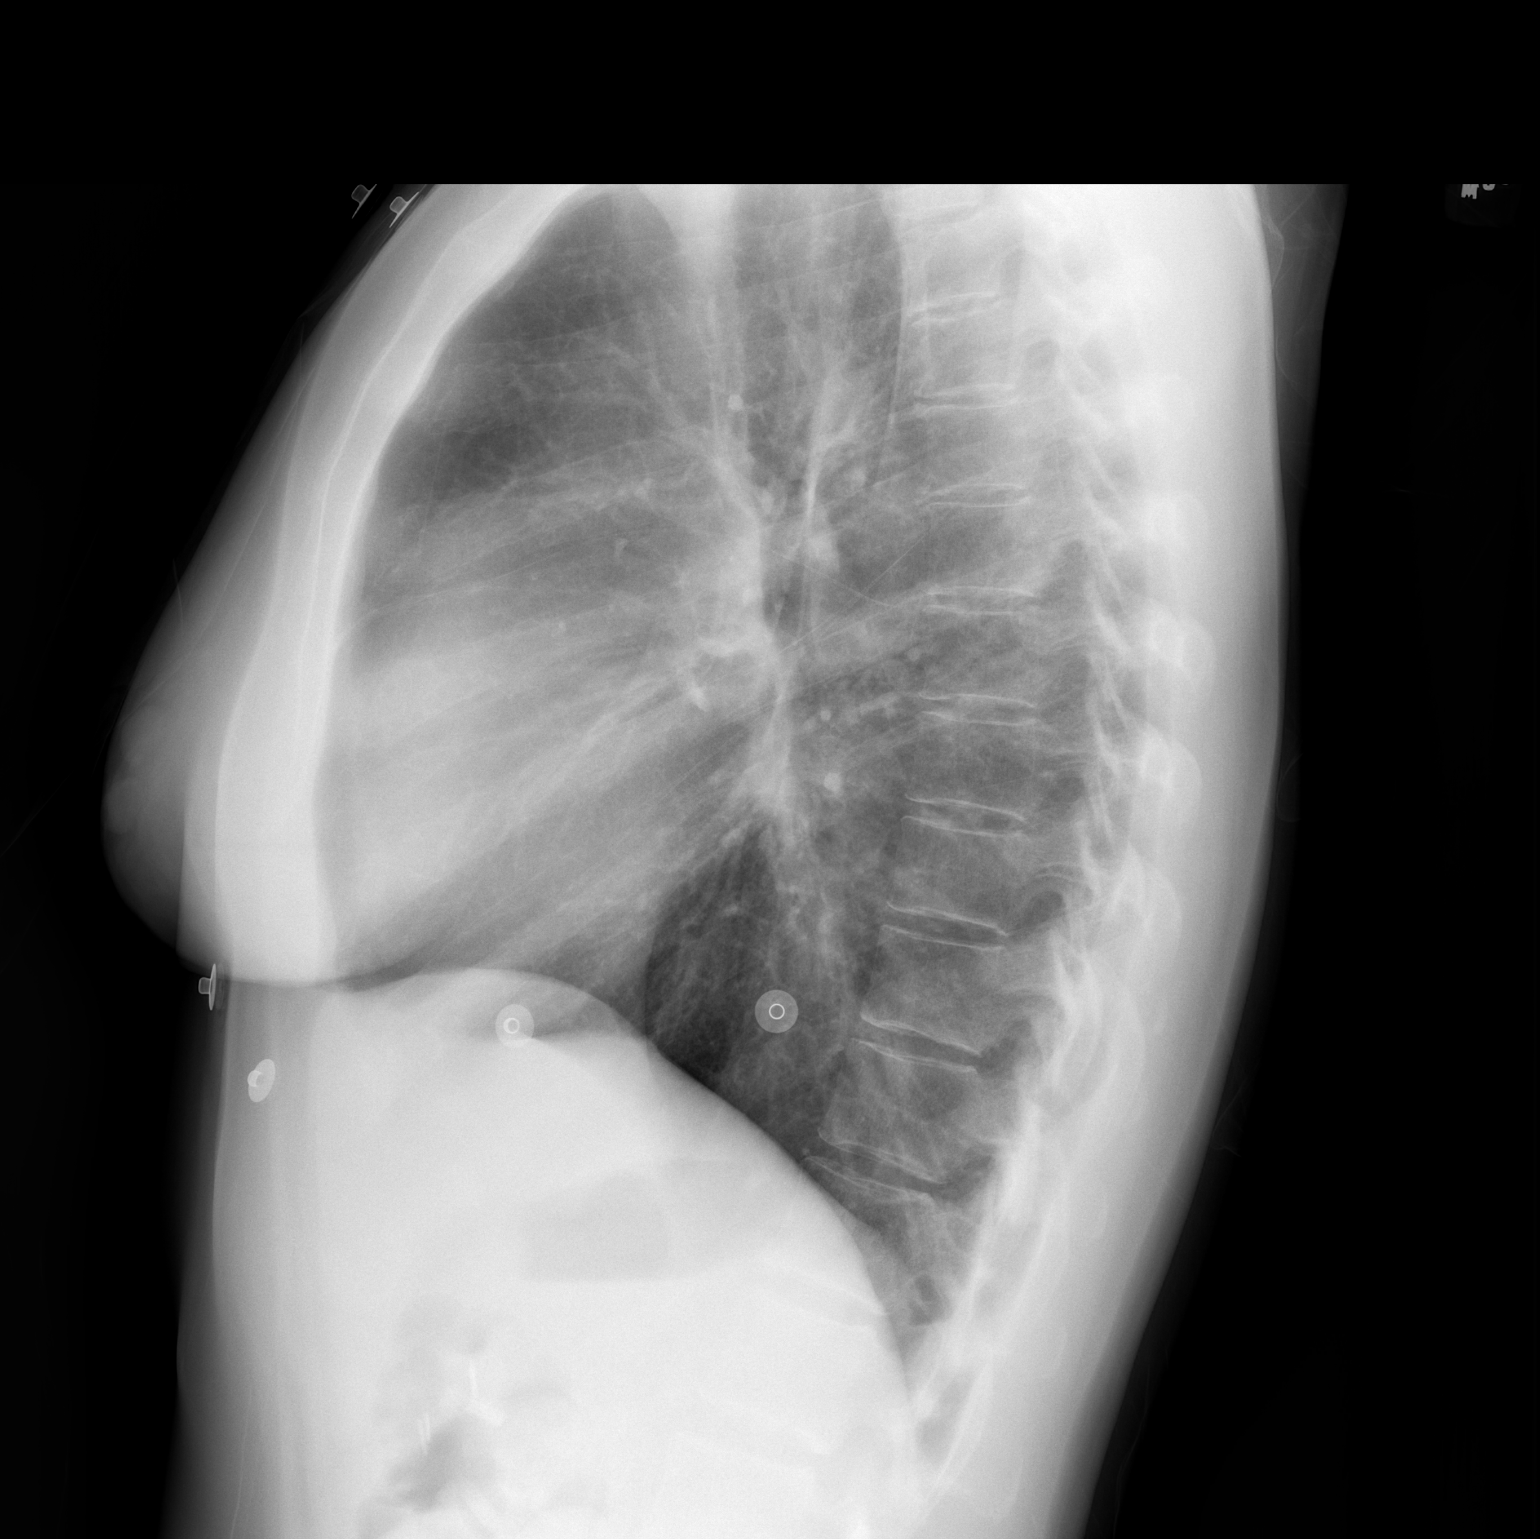

[2 of 2 positions shown; findings below may reference images not displayed]

FINDINGS: The cardiomediastinal silhouette is unremarkable.
The lungs are clear.
There is no evidence of focal airspace disease, pulmonary edema,
pulmonary nodule/mass, pleural effusion, or pneumothorax.
No acute bony abnormalities are identified.

Evidence of cervical surgery and cholecystectomy noted.
IMPRESSION: No evidence of active cardiopulmonary disease.

## 2011-09-11 IMAGING — CR DG CHEST 2V
2 series · 2 of 2 positions shown · non-contrast
Comparison: 12/01/2010

CLINICAL DATA: Abdominal pain.

CHEST - 2 VIEW

[w chest pa]
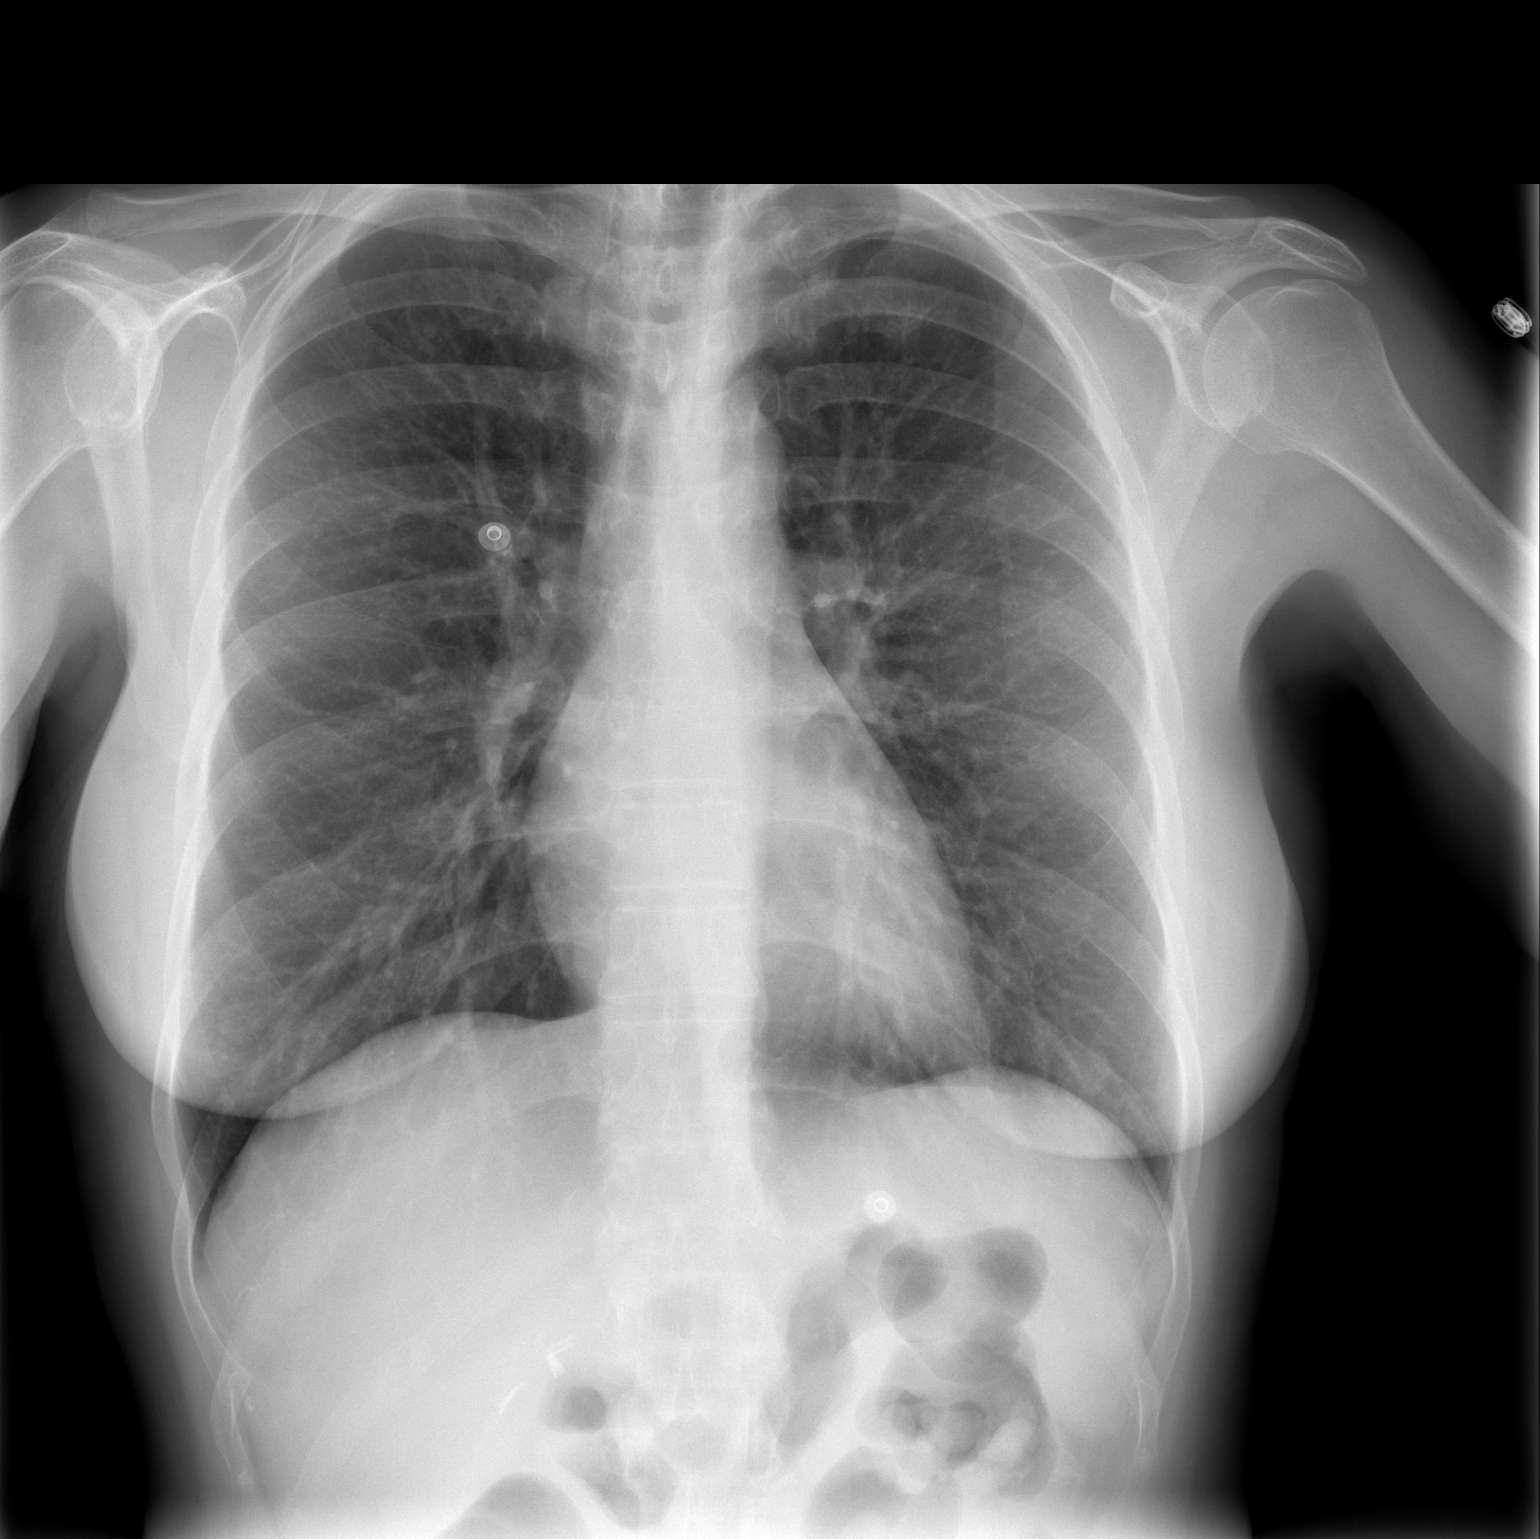

[w chest lat]
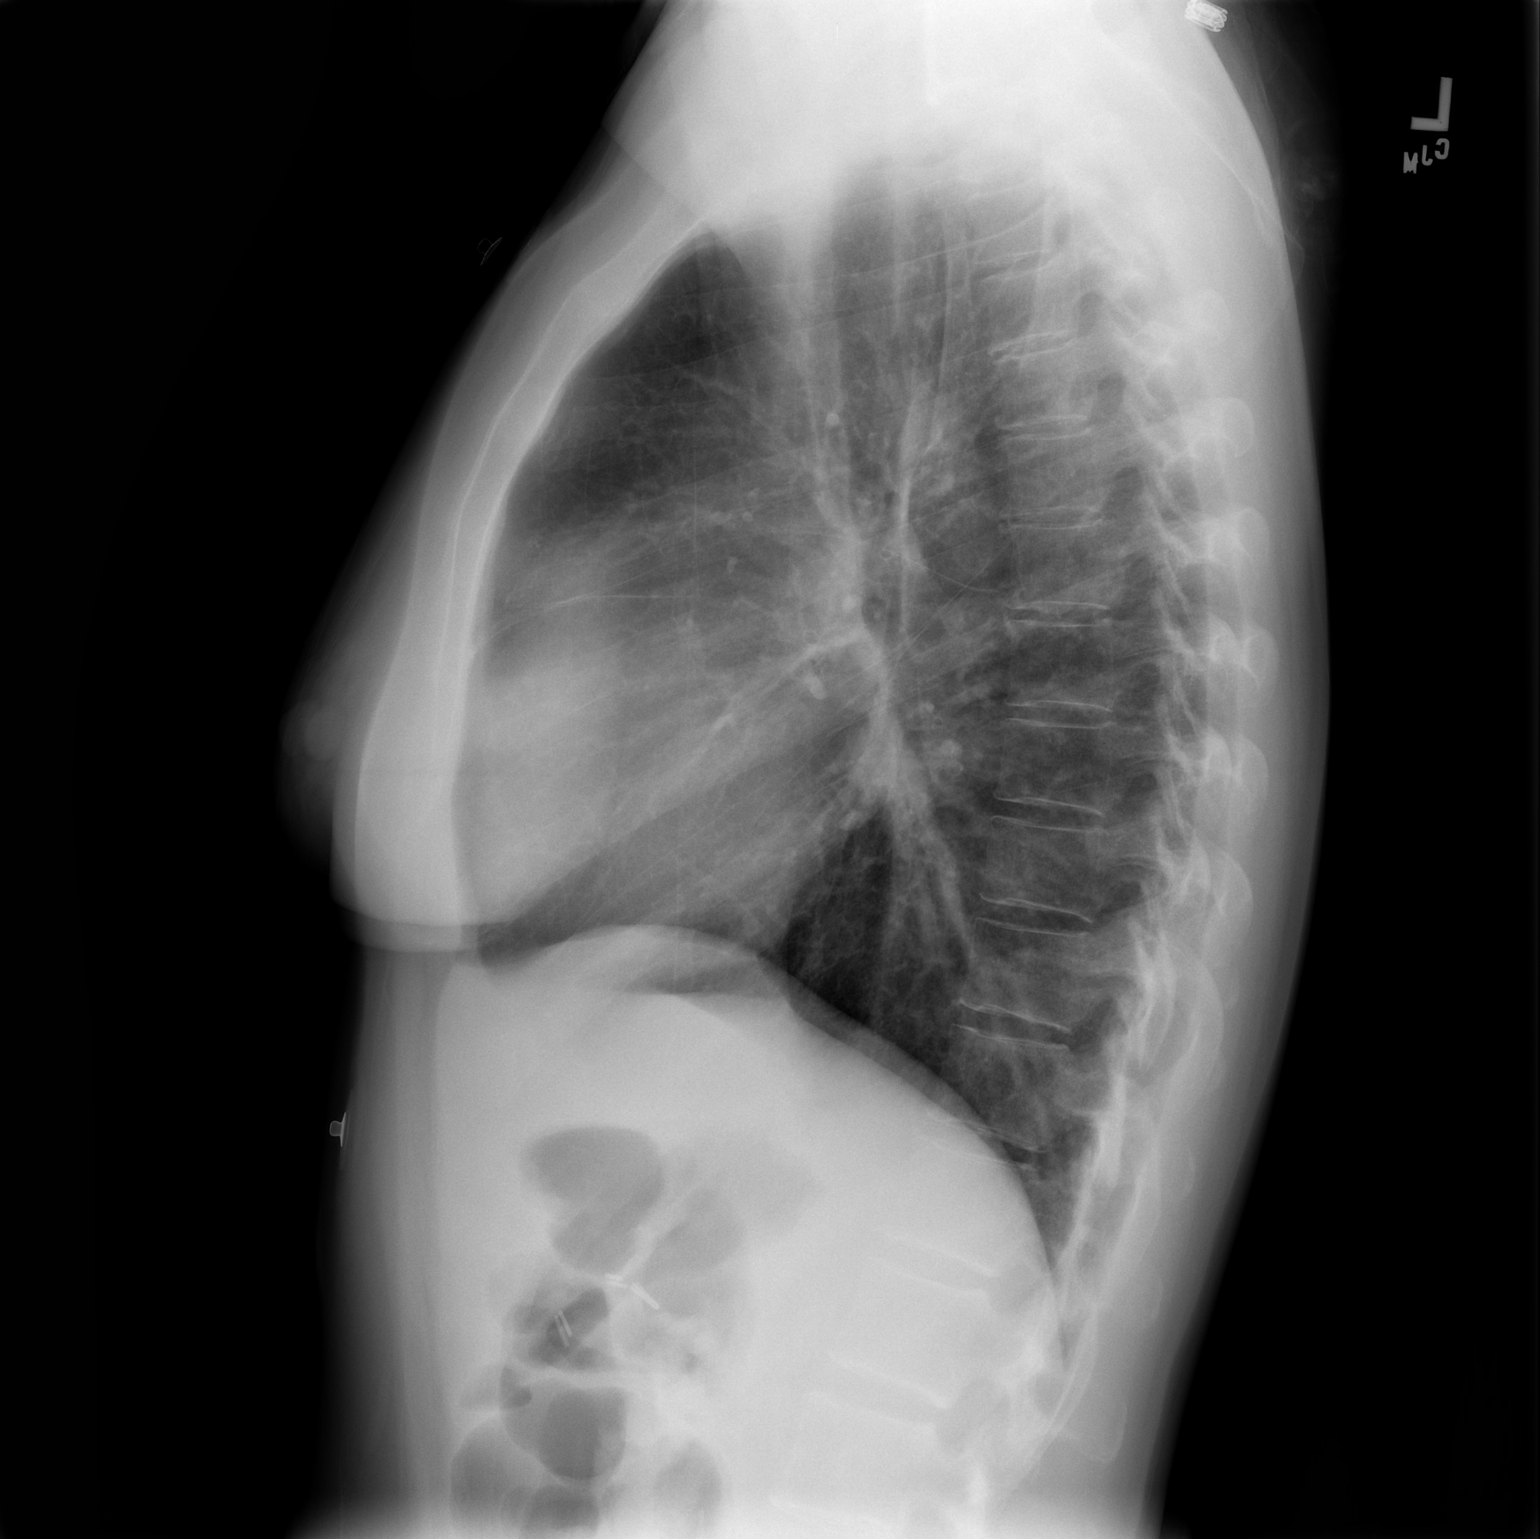

[2 of 2 positions shown; findings below may reference images not displayed]

FINDINGS: Trachea is midline.  Heart size normal.  Lungs are clear.
No pleural fluid.
IMPRESSION: No acute findings.

## 2011-10-31 ENCOUNTER — Emergency Department (HOSPITAL_COMMUNITY)
Admission: EM | Admit: 2011-10-31 | Discharge: 2011-10-31 | Disposition: A | Discharge status: Home / Self Care | Payer: Medicaid Other | Attending: Emergency Medicine | Admitting: Emergency Medicine

## 2011-10-31 ENCOUNTER — Encounter (HOSPITAL_COMMUNITY): Payer: Self-pay | Admitting: Emergency Medicine

## 2011-10-31 ENCOUNTER — Emergency Department (HOSPITAL_COMMUNITY): Payer: Medicaid Other

## 2011-10-31 DIAGNOSIS — R0989 Other specified symptoms and signs involving the circulatory and respiratory systems: Secondary | ICD-10-CM | POA: Insufficient documentation

## 2011-10-31 DIAGNOSIS — E785 Hyperlipidemia, unspecified: Secondary | ICD-10-CM | POA: Insufficient documentation

## 2011-10-31 DIAGNOSIS — E860 Dehydration: Secondary | ICD-10-CM | POA: Insufficient documentation

## 2011-10-31 DIAGNOSIS — I2510 Atherosclerotic heart disease of native coronary artery without angina pectoris: Secondary | ICD-10-CM | POA: Insufficient documentation

## 2011-10-31 DIAGNOSIS — E039 Hypothyroidism, unspecified: Secondary | ICD-10-CM | POA: Insufficient documentation

## 2011-10-31 DIAGNOSIS — R509 Fever, unspecified: Secondary | ICD-10-CM | POA: Insufficient documentation

## 2011-10-31 DIAGNOSIS — R109 Unspecified abdominal pain: Secondary | ICD-10-CM | POA: Insufficient documentation

## 2011-10-31 DIAGNOSIS — I10 Essential (primary) hypertension: Secondary | ICD-10-CM | POA: Insufficient documentation

## 2011-10-31 DIAGNOSIS — R0609 Other forms of dyspnea: Secondary | ICD-10-CM | POA: Insufficient documentation

## 2011-10-31 DIAGNOSIS — R059 Cough, unspecified: Secondary | ICD-10-CM | POA: Insufficient documentation

## 2011-10-31 DIAGNOSIS — R197 Diarrhea, unspecified: Secondary | ICD-10-CM | POA: Insufficient documentation

## 2011-10-31 DIAGNOSIS — R05 Cough: Secondary | ICD-10-CM | POA: Insufficient documentation

## 2011-10-31 LAB — DIFFERENTIAL
Basophils Relative: 1 % (ref 0–1)
Lymphs Abs: 2.6 10*3/uL (ref 0.7–4.0)
Monocytes Absolute: 0.9 10*3/uL (ref 0.1–1.0)
Monocytes Relative: 9 % (ref 3–12)
Neutro Abs: 6.2 10*3/uL (ref 1.7–7.7)
Neutrophils Relative %: 63 % (ref 43–77)

## 2011-10-31 LAB — COMPREHENSIVE METABOLIC PANEL
Albumin: 3.6 g/dL (ref 3.5–5.2)
Alkaline Phosphatase: 67 U/L (ref 39–117)
BUN: 11 mg/dL (ref 6–23)
Creatinine, Ser: 0.82 mg/dL (ref 0.50–1.10)
GFR calc Af Amer: >90 mL/min (ref >90)
Glucose, Bld: 86 mg/dL (ref 70–99)
Potassium: 3.7 mEq/L (ref 3.5–5.1)
Total Protein: 7 g/dL (ref 6.0–8.3)

## 2011-10-31 LAB — URINALYSIS, ROUTINE W REFLEX MICROSCOPIC
Ketones, ur: 15 mg/dL — AB
Leukocytes, UA: NEGATIVE
Nitrite: NEGATIVE
Protein, ur: NEGATIVE mg/dL
pH: 6 (ref 5.0–8.0)

## 2011-10-31 LAB — CBC
HCT: 40.9 % (ref 36.0–46.0)
Hemoglobin: 14.3 g/dL (ref 12.0–15.0)
MCH: 29.4 pg (ref 26.0–34.0)
MCHC: 35 g/dL (ref 30.0–36.0)
RBC: 4.86 MIL/uL (ref 3.87–5.11)

## 2011-10-31 LAB — LIPASE, BLOOD: Lipase: 15 U/L (ref 11–59)

## 2011-10-31 MED ORDER — LORAZEPAM 1 MG PO TABS
2.0000 mg | ORAL_TABLET | Freq: Once | ORAL | Status: AC
  Administered 2011-10-31: 2 mg via ORAL
  Filled 2011-10-31: qty 2

## 2011-10-31 MED ORDER — SODIUM CHLORIDE 0.9 % IV SOLN
Freq: Once | INTRAVENOUS | Status: AC
  Administered 2011-10-31: 20:00:00 via INTRAVENOUS

## 2011-10-31 MED ORDER — SODIUM CHLORIDE 0.9 % IV BOLUS (SEPSIS)
1000.0000 mL | Freq: Once | INTRAVENOUS | Status: AC
  Administered 2011-10-31: 1000 mL via INTRAVENOUS

## 2011-10-31 MED ORDER — ONDANSETRON HCL 4 MG/2ML IJ SOLN
4.0000 mg | Freq: Once | INTRAMUSCULAR | Status: AC
  Administered 2011-10-31: 4 mg via INTRAVENOUS
  Filled 2011-10-31: qty 2

## 2011-10-31 MED ORDER — HYDROCORTISONE 20 MG PO TABS
20.0000 mg | ORAL_TABLET | ORAL | Status: DC
  Filled 2011-10-31: qty 1

## 2011-10-31 MED ORDER — OXYCODONE HCL 5 MG PO TABS
15.0000 mg | ORAL_TABLET | Freq: Once | ORAL | Status: AC
  Administered 2011-10-31: 15 mg via ORAL
  Filled 2011-10-31: qty 3

## 2011-10-31 MED ORDER — HYDROCORTISONE 20 MG PO TABS
20.0000 mg | ORAL_TABLET | Freq: Every day | ORAL | Status: DC
  Administered 2011-10-31: 20 mg via ORAL

## 2011-10-31 NOTE — ED Notes (Signed)
To x-ray

## 2011-10-31 NOTE — Discharge Instructions (Signed)
Dehydration Dehydration is the reduction of water and fluid from the body to a level below that required for proper functioning. CAUSES  Dehydration occurs when there is excessive fluid loss from the body or when loss of normal fluids is not adequately replaced.  Loss of fluids occurs in vomiting, diarrhea, excessive sweating, excessive urine output, or excessive loss of fluid from the lungs (as occurs in fever or in patients on a ventilator).   Inadequate fluid replacement occurs with nausea or decreased appetite due to illness, sore throat, or mouth pain.  SYMPTOMS  Mild dehydration  Thirst (infants and young children may not be able to tell you they are thirsty).   Dry lips.   Slightly dry mouth membranes.  Moderate dehydration  Very dry mouth membranes.   Sunken eyes.   Sunken soft spot (fontanelle) on infant's head.   Skin does not bounce back quickly when lightly pinched and released.   Decreased urine production.   Decreased tear production.  Severe dehydration  Rapid, weak pulse (more than 100 beats per minute at rest).   Cold hands and feet.   Loss of ability to sweat in spite of heat and temperature.   Rapid breathing.   Blue lips.   Confusion, lethargy, difficult to arouse.   Minimal urine production.   No tears.  DIAGNOSIS  Your caregiver will diagnose dehydration based on your symptoms and your exam. Blood and urine tests will help confirm the diagnosis. The diagnostic evaluation should also identify the cause of dehydration. PREVENTION  The body depends on a proper balance of fluid and salts (electrolytes) for normal function. Adequate fluid intake in the presence of illness or other stresses (such as extreme exercise) is important.  TREATMENT   Mild dehydration is safe to self-treat for most ages as long as it does not worsen. Contact your caregiver for even mild dehydration in infants and the elderly.   In teenagers and adults with moderate  dehydration, careful home treatment (as outlined below) can be safe. Phone contact with a caregiver is advised. Children under 53 years of age with moderate dehydration should see a caregiver.   If you or your child is severely dehydrated, go to a hospital for treatment. Intravenous (IV) fluids will quickly reverse dehydration and are often lifesaving in young children, infants, and elderly persons.  HOME CARE INSTRUCTIONS  Small amounts of fluids should be taken frequently. Large amounts at one time may not be tolerated. Plain water may be harmful in infants and the elderly. Oral rehydration solutions (ORS) are available at pharmacies and grocery stores. ORS replaces water and important electrolytes in proper proportions. Sports drinks are not as effective as ORS and may be harmful because the sugar can make diarrhea worse.  As a general guideline for children, replace any new fluid losses from diarrhea and/or vomiting with ORS as follows:   If your child weighs 22 pounds or under (10 kg or less), give 60-120 mL (1/4-1/2 cup or 2-4 ounces) of ORS for each diarrheal stool or vomiting episode.   If your child weighs more than 22 pounds (more than 10 kg), give 120-240 mL (1/2-1 cup or 4-8 ounces) of ORS for each diarrheal stool or vomiting episode.   If your child is vomiting, it may be helpful to give the above ORS replacement in 5 mL (1 teaspoon) amounts every 5 minutes and increase as tolerated.   While correcting for dehydration, children should eat normally. However, foods high in sugar should be  avoided because they may worsen diarrhea. Large amounts of carbonated soft drinks, juice, gelatin desserts, and other highly sugared drinks should be avoided.   After correction of dehydration, other liquids that are appealing to the child may be added. Children should drink small amounts of fluids frequently and fluids should be increased as tolerated. Children should drink enough fluids to keep urine  clear or pale yellow.   Adults should eat normally while drinking more fluids than usual. Drink small amounts of fluids frequently and increase the amount as tolerated. Drink enough fluids to keep urine clear or pale yellow. Broths, weak decaffeinated tea, lemon-lime soft drinks (allowed to go flat), and ORS replace fluids and electrolytes.   Avoid:   Carbonated drinks.   Juice.   Extremely hot or cold fluids.   Caffeine drinks.   Fatty, greasy foods.   Alcohol.   Tobacco.   Too much intake of anything at one time.   Gelatin desserts.   Probiotics are active cultures of beneficial bacteria. They may lessen the amount and number of diarrheal stools in adults. Probiotics can be found in yogurt with active cultures and in supplements.   Wash your hands well to avoid spreading germs (bacteria) and viruses.   Antidiarrheal medicines are not recommended for infants and children.   Only take over-the-counter or prescription medicines for pain, discomfort, or fever as directed by your caregiver. Do not give aspirin to children.   For adults with dehydration, ask your caregiver if you should continue all prescribed and over-the-counter medicines.   If your caregiver has given you a follow-up appointment, it is very important to keep that appointment. Not keeping the appointment could result in a lasting (chronic) or permanent injury and disability. If there is any problem keeping the appointment, you must call to reschedule.  SEEK IMMEDIATE MEDICAL CARE IF:   You are unable to keep fluids down or other symptoms become worse despite treatment.   Vomiting or diarrhea develops and becomes persistent.   There is vomiting of blood or green matter (bile).   There is blood in the stool or the stools are black and tarry.   There is no urine output in 6 to 8 hours or there is only a small amount of very dark urine.   Abdominal pain develops, increases, or localizes.   You or your child  has an oral temperature above 102 F (38.9 C), not controlled by medicine.   Your baby is older than 3 months with a rectal temperature of 102.13F (38.9 C) or higher.   Your baby is 34 months old or younger with a rectal temperature of 100.4 F (38 C) or higher.   You develop excessive weakness, dizziness, fainting, or extreme thirst.   You develop a rash, stiff neck, severe headache, or you become irritable, sleepy, or difficult to awaken.  MAKE SURE YOU:   Understand these instructions.   Will watch your condition.   Will get help right away if you are not doing well or get worse.  Document Released: 08/23/2005 Document Revised: 03/08/2011 Document Reviewed: 07/22/2009 [facility] Patient Information 2012 [place], [place].Cough, Adult  A cough is a reflex that helps clear your throat and airways. It can help heal the body or may be a reaction to an irritated airway. A cough may only last 2 or 3 weeks (acute) or may last more than 8 weeks (chronic).  CAUSES Acute cough:  Viral or bacterial infections.  Chronic cough:  Infections.   Allergies.  Asthma.   Post-nasal drip.   Smoking.   Heartburn or acid reflux.   Some medicines.   Chronic lung problems (COPD).   Cancer.  SYMPTOMS   Cough.   Fever.   Chest pain.   Increased breathing rate.   High-pitched whistling sound when breathing (wheezing).   Colored mucus that you cough up (sputum).  TREATMENT   A bacterial cough may be treated with antibiotic medicine.   A viral cough must run its course and will not respond to antibiotics.   Your caregiver may recommend other treatments if you have a chronic cough.  HOME CARE INSTRUCTIONS   Only take over-the-counter or prescription medicines for pain, discomfort, or fever as directed by your caregiver. Use cough suppressants only as directed by your caregiver.   Use a cold steam vaporizer or humidifier in your bedroom or home to help loosen secretions.   Sleep  in a semi-upright position if your cough is worse at night.   Rest as needed.   Stop smoking if you smoke.  SEEK IMMEDIATE MEDICAL CARE IF:   You have pus in your sputum.   Your cough starts to worsen.   You cannot control your cough with suppressants and are losing sleep.   You begin coughing up blood.   You have difficulty breathing.   You develop pain which is getting worse or is uncontrolled with medicine.   You have a fever.  MAKE SURE YOU:   Understand these instructions.   Will watch your condition.   Will get help right away if you are not doing well or get worse.  Document Released: 02/19/2011 Document Revised: 05/05/2011 Document Reviewed: 02/19/2011 [facility] Patient Information 2012 [place], [place].

## 2011-10-31 NOTE — ED Notes (Signed)
Back from xray

## 2011-10-31 NOTE — ED Notes (Signed)
EDP into see pt, family at [facility] x2.

## 2011-10-31 NOTE — ED Provider Notes (Signed)
History     CSN: [id number]  Arrival date & time 10/31/11  1652   First MD Initiated Contact with Patient 10/31/11 1840      Chief Complaint  Patient presents with  . Cough    (Consider location/radiation/quality/duration/timing/severity/associated sxs/prior treatment) Patient is a 46 y.o. female presenting with cough. The history is provided by the patient.  Cough   patient presents with cough x2 weeks. Been productive of green sputum. Fever and chills and dyspnea..Bilateral rib pain. No medications taken for this.  Patient also notes abdominal cramping with watery diarrhea. History of diarrhea in the past. No medications taken for this. Denies any blood in her stools. Did recently finish a course of antibiotics within the past 2 weeks. Patient also has a history of gastroparesis  Past Medical History  Diagnosis Date  . Cardiomyopathy     resolved  . Chest pain     chronicc  . Hyperlipidemia   . HTN (hypertension)   . Hypothyroidism   . Adrenal insufficiency   . Anxiety   . Nondiabetic gastroparesis   . CAD (coronary artery disease)     Cath 2008 EF normal. RCA 50-60, Septal 50%. Myoview 3/12: EF 53% normal perfusion  . Chronic back pain   . D (diarrhea)   . History of CHF (congestive heart failure)   . QT prolongation   . Addison disease     Past Surgical History  Procedure Date  . Cardiac catheterization 03/2007    showed 60% lesion in the right coronary artery  . Cholecystectomy   . Spine surgery   . Vesicovaginal fistula closure w/ tah   . Varicose vein surgery   . Abdominal hysterectomy     Family History  Problem Relation Age of Onset  . Coronary artery disease    . Heart attack Mother   . Cancer Father     Colon    History  Substance Use Topics  . Smoking status: Current Everyday Smoker    Types: Cigarettes  . Smokeless tobacco: Not on file   Comment: smokes 6-7 cigarettes per day  . Alcohol Use: No    OB History    Grav Para Term  Preterm Abortions TAB SAB Ect Mult Living                  Review of Systems  Respiratory: Positive for cough.   All other systems reviewed and are negative.    Allergies  Bee venom; Penicillins; Doxycycline; Dust mite extract; Erythromycin; Keflex; Morphine and related; Sulfonamide derivatives; and Tramadol  Home Medications   Current Outpatient Rx  Name Route Sig Dispense Refill  . ASPIRIN 81 MG PO TABS Oral Take 81 mg by mouth daily.      . CYCLOBENZAPRINE HCL 10 MG PO TABS Oral Take 10 mg by mouth 3 (three) times daily as needed. For muscle spasms    . HYDROCORTISONE 10 MG PO TABS Oral Take 20 mg by mouth 2 (two) times daily. Take 2 tablets in the morning and 2 tablets in the evening     . LEVOTHYROXINE SODIUM 100 MCG PO TABS Oral Take 100 mcg by mouth daily.     Marland Kitchen LORAZEPAM 1 MG PO TABS Oral Take 1 mg by mouth 4 (four) times daily.     Marland Kitchen METOCLOPRAMIDE HCL 10 MG PO TABS Oral Take 20 mg by mouth 3 (three) times daily before meals. As needed    . ONDANSETRON HCL 8 MG PO TABS Oral Take  8 mg by mouth every 6 (six) hours as needed. For nausea    . OXYCODONE HCL 15 MG PO TABS Oral Take 15 mg by mouth every 6 (six) hours as needed. For pain    . PANTOPRAZOLE SODIUM 40 MG PO TBEC Oral Take 40 mg by mouth daily as needed. For acid reflux    . ZOLPIDEM TARTRATE 10 MG PO TABS Oral Take 10 mg by mouth at bedtime.      BP 111/66  Pulse 74  Temp(Src) 98.5 F (36.9 C) (Oral)  Resp 8  SpO2 100%  Physical Exam  Nursing note and vitals reviewed. Constitutional: She is oriented to person, place, and time. Vital signs are normal. She appears well-developed and well-nourished.  Non-toxic appearance. No distress.  HENT:  Head: Normocephalic and atraumatic.  Eyes: Conjunctivae, EOM and lids are normal. Pupils are equal, round, and reactive to light.  Neck: Normal range of motion. Neck supple. No tracheal deviation present. No mass present.  Cardiovascular: Normal rate, regular rhythm and  normal heart sounds.  Exam reveals no gallop.   No murmur heard. Pulmonary/Chest: Effort normal and breath sounds normal. No stridor. No respiratory distress. She has no decreased breath sounds. She has no wheezes. She has no rhonchi. She has no rales.  Abdominal: Soft. Normal appearance and bowel sounds are normal. She exhibits no distension. There is generalized tenderness. There is no rigidity, no rebound, no guarding and no CVA tenderness.  Musculoskeletal: Normal range of motion. She exhibits no edema and no tenderness.  Neurological: She is alert and oriented to person, place, and time. She has normal strength. No cranial nerve deficit or sensory deficit. GCS eye subscore is 4. GCS verbal subscore is 5. GCS motor subscore is 6.  Skin: Skin is warm and dry. No abrasion and no rash noted.  Psychiatric: She has a normal mood and affect. Her speech is normal and behavior is normal.    ED Course  Procedures (including critical care time)   Labs Reviewed  CBC  DIFFERENTIAL  COMPREHENSIVE METABOLIC PANEL  URINALYSIS, ROUTINE W REFLEX MICROSCOPIC  URINE CULTURE  CLOSTRIDIUM DIFFICILE BY PCR  STOOL CULTURE  LIPASE, BLOOD   No results found.   No diagnosis found.    MDM  Patient's labs and x-rays reviewed. Patient feels better at this time given IV fluids for her mild dehydration. She felt stable for discharge        Toy Baker, MD 10/31/11 2247

## 2011-10-31 NOTE — ED Notes (Addendum)
C/o productive cough with green sputum x 2 weeks.  Also c/o dizziness, fever, chills, bilateral rib pain,diarrhea, and headache.

## 2011-10-31 NOTE — ED Notes (Signed)
Pt c/o IV site burning, IVF stopped, mild redness and swelling noted, changed to NSL, will d/c, lab finished at [facility], EDP in to see pt, xray pending at door. Alert, NAD, calm, interactive, resps e/u, speakingin clear complete sentences.

## 2011-11-02 LAB — URINE CULTURE: Culture  Setup Time: 201302250205

## 2011-11-24 IMAGING — MR MR LUMBAR SPINE W/O CM
4 of 5 series · 23 of 48 positions shown · non-contrast
Comparison: 12/20/2008.

CLINICAL DATA: 45-year-old female low back pain radiating to both
legs.

MRI LUMBAR SPINE WITHOUT CONTRAST
TECHNIQUE: Multiplanar and multiecho pulse sequences of the lumbar
spine were obtained without intravenous contrast.

[Series 3: T2 · sagittal · 4.0mm · 0.87mm/px · 4 of 12 slices shown (1 of 2)]
[im 1/12]
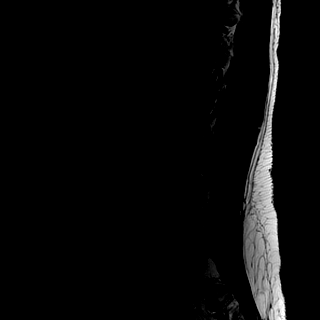
[im 4/12]
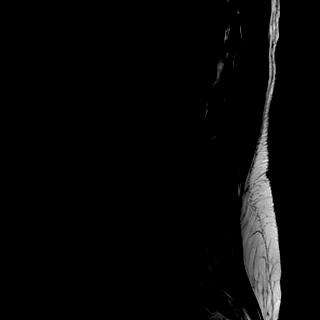
[im 8/12]
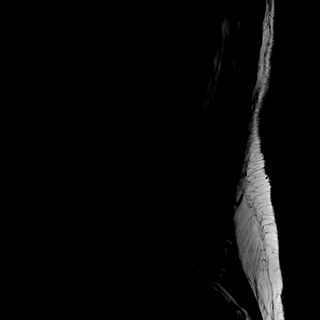
[im 12/12]
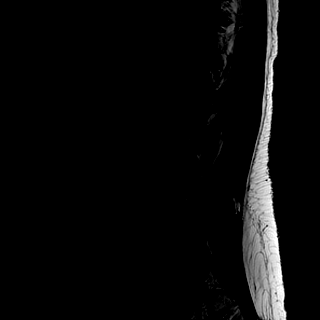

[Series 4: T1 · sagittal · 4.0mm · 0.43mm/px · 5 of 12 slices shown (1 of 2)]
[im 1/12]
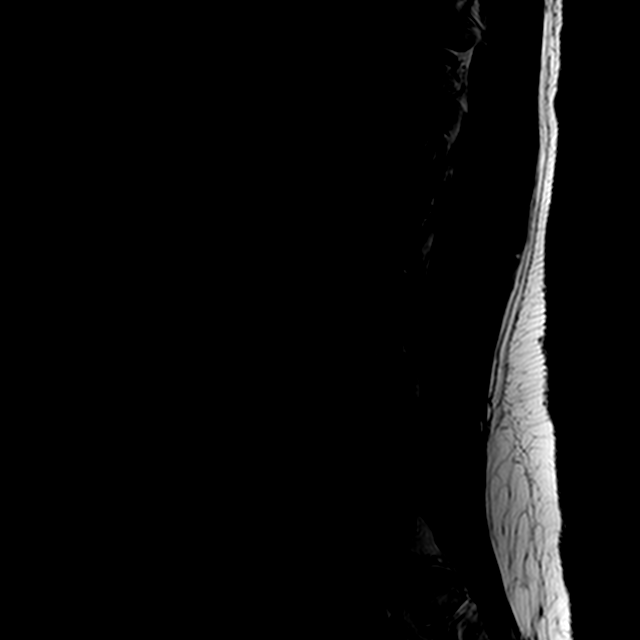
[im 3/12]
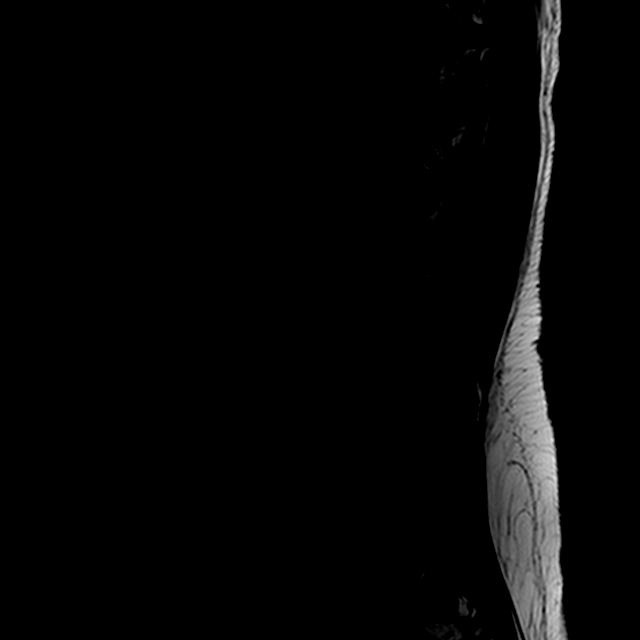
[im 6/12]
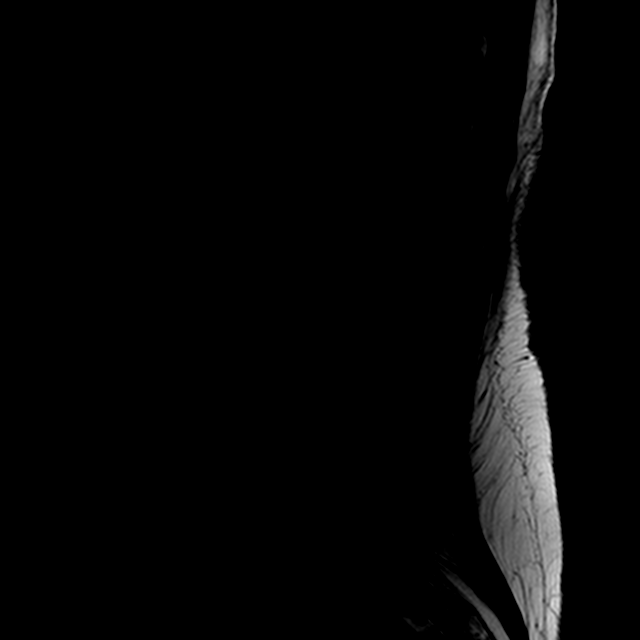
[im 9/12]
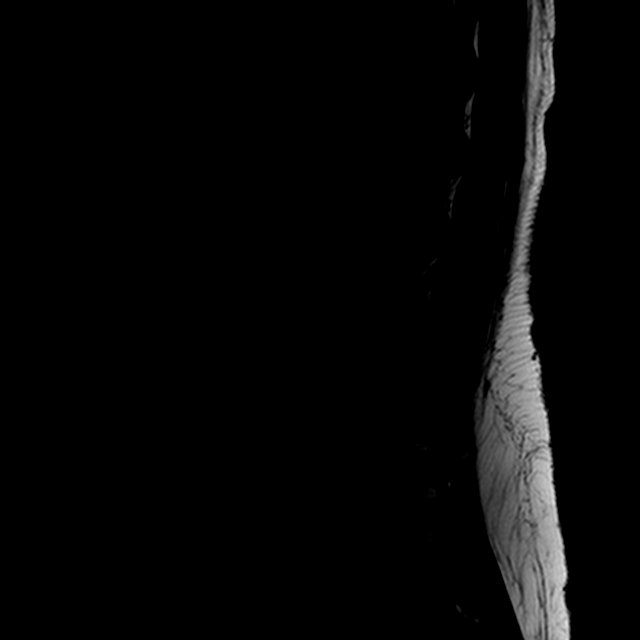
[im 12/12]
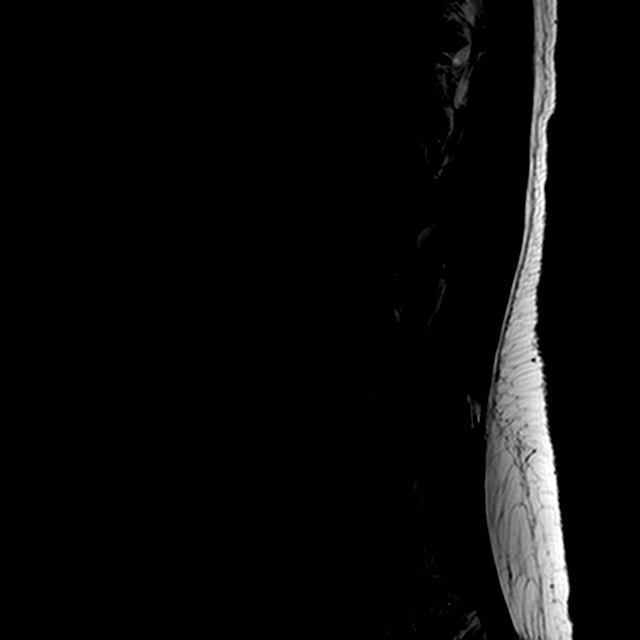

[Series 6: T2 · axial · 4.0mm · 0.54mm/px · z∈[-108,+91]mm · 11 of 44 slices shown (2 of 2)]
[im 3/44]
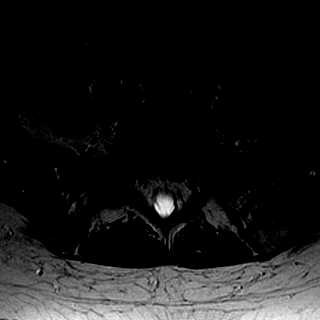
[im 6/44]
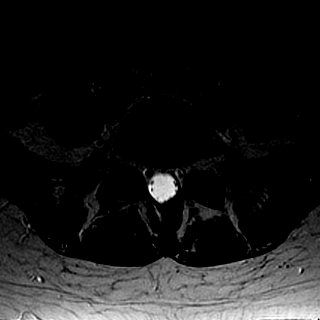
[im 9/44]
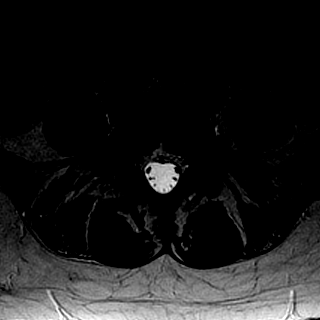
[im 14/44]
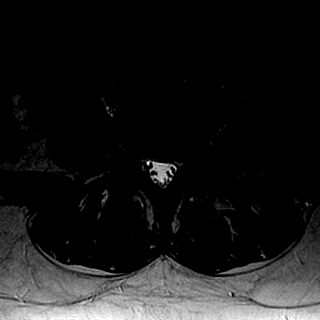
[im 19/44]
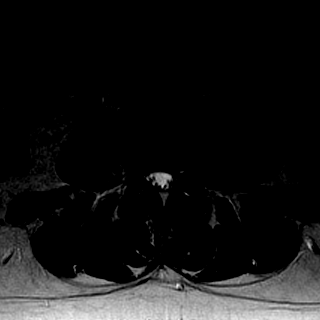
[im 22/44]
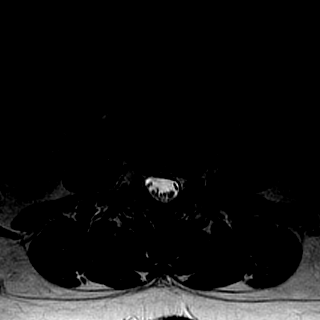
[im 25/44]
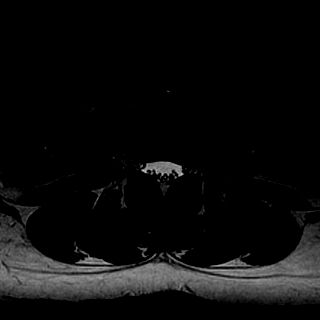
[im 30/44]
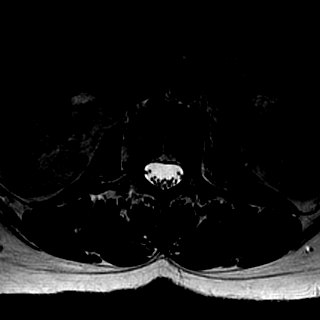
[im 35/44]
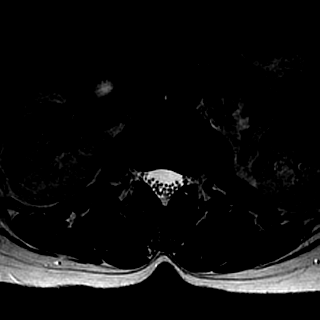
[im 38/44]
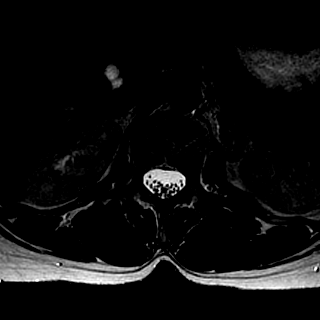
[im 41/44]
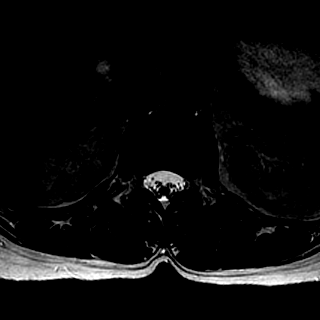

[Series 7: T1 · axial · 4.0mm · 0.27mm/px · z∈[-95,+78]mm · 3 of 44 slices shown (2 of 2)]
[im 6/44]
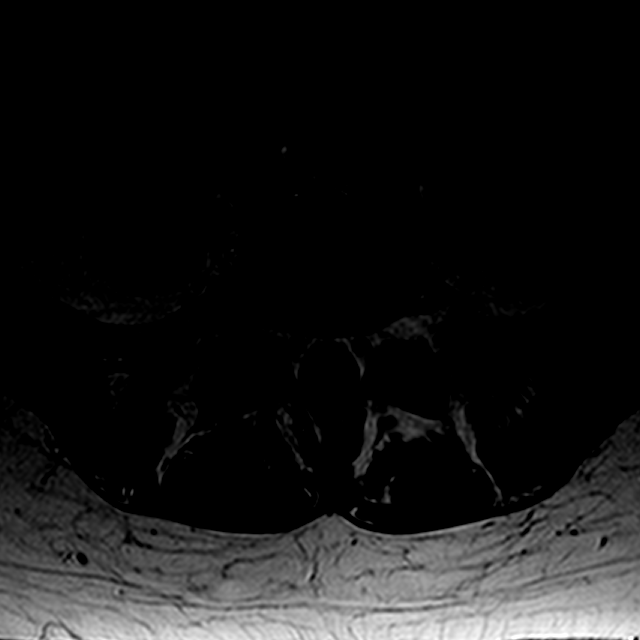
[im 22/44]
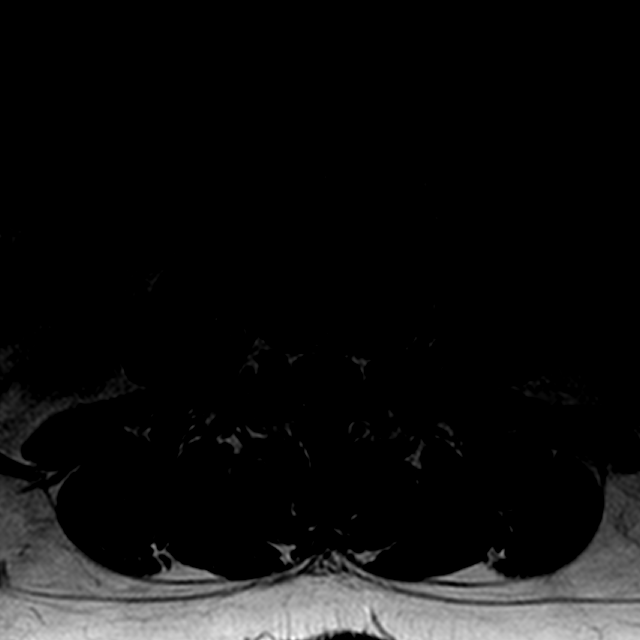
[im 38/44]
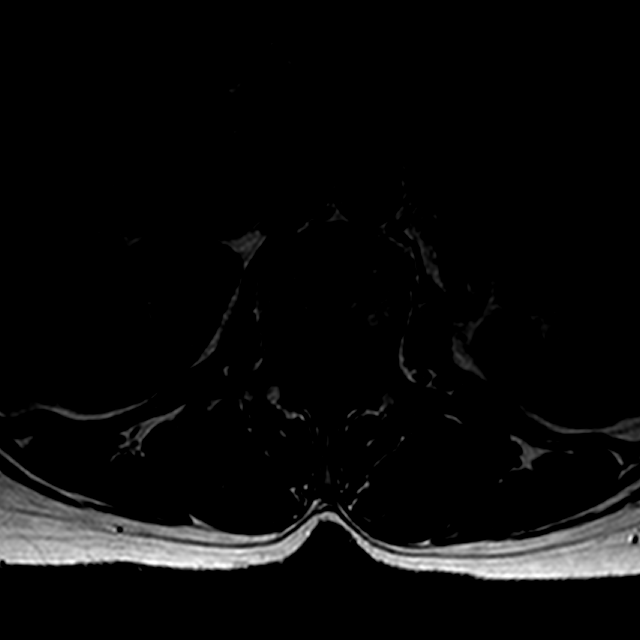

[23 of 48 positions shown; findings below may reference images not displayed]

FINDINGS: Same numbering system used as on the comparison.
Visualized abdominal viscera and paraspinal soft tissues are within
normal limits.   Visualized lower thoracic spinal cord is normal
with conus medularis at L1.  Stable, normal vertebral height and
alignment. No marrow edema or evidence of acute osseous
abnormality.

T10-T11:  Grossly negative.

T11-T12:  Negative.

T12-L1:  Negative.

L1-L2:  Negative.

L2-L3:  Negative disc.  Mild to moderate facet hypertrophy.  No
stenosis.

L3-L4:  Chronic disc desiccation.  Chronic left eccentric disc
bulge not significantly changed.  Undersurface annular tear in
proximity to the left lateral recess again noted.  Stable facet
hypertrophy.  Mild left lateral recess and moderate left foraminal
stenosis, stable.

L4-L5:  Chronic disc desiccation.  Left eccentric circumferential
disc bulge with broad base superimposed left paracentral disc
protrusion.  Moderate facet and ligament flavum hypertrophy.
Moderate left lateral recess and foraminal stenosis, stable to
mildly progressed.  Small posteriorly situated synovial cysts
incidentally noted and will not cause neural compromise.

L5-S1:  Negative disc.  Stable mild facet hypertrophy.
IMPRESSION: 1.  Little interval change in disc degeneration with left eccentric
disc protrusions at L3-L4 and L4-L5 affecting the left lateral
recesses and left L3 and L4 foramen as detailed above.
2.  No lumbar spinal stenosis.  No new disc herniation.

## 2011-12-04 IMAGING — CR DG CHEST 2V
2 series · 2 of 2 positions shown · non-contrast
Comparison: 12/31/2010

CLINICAL DATA: :  Chest pain

CHEST - 2 VIEW

[w chest pa]
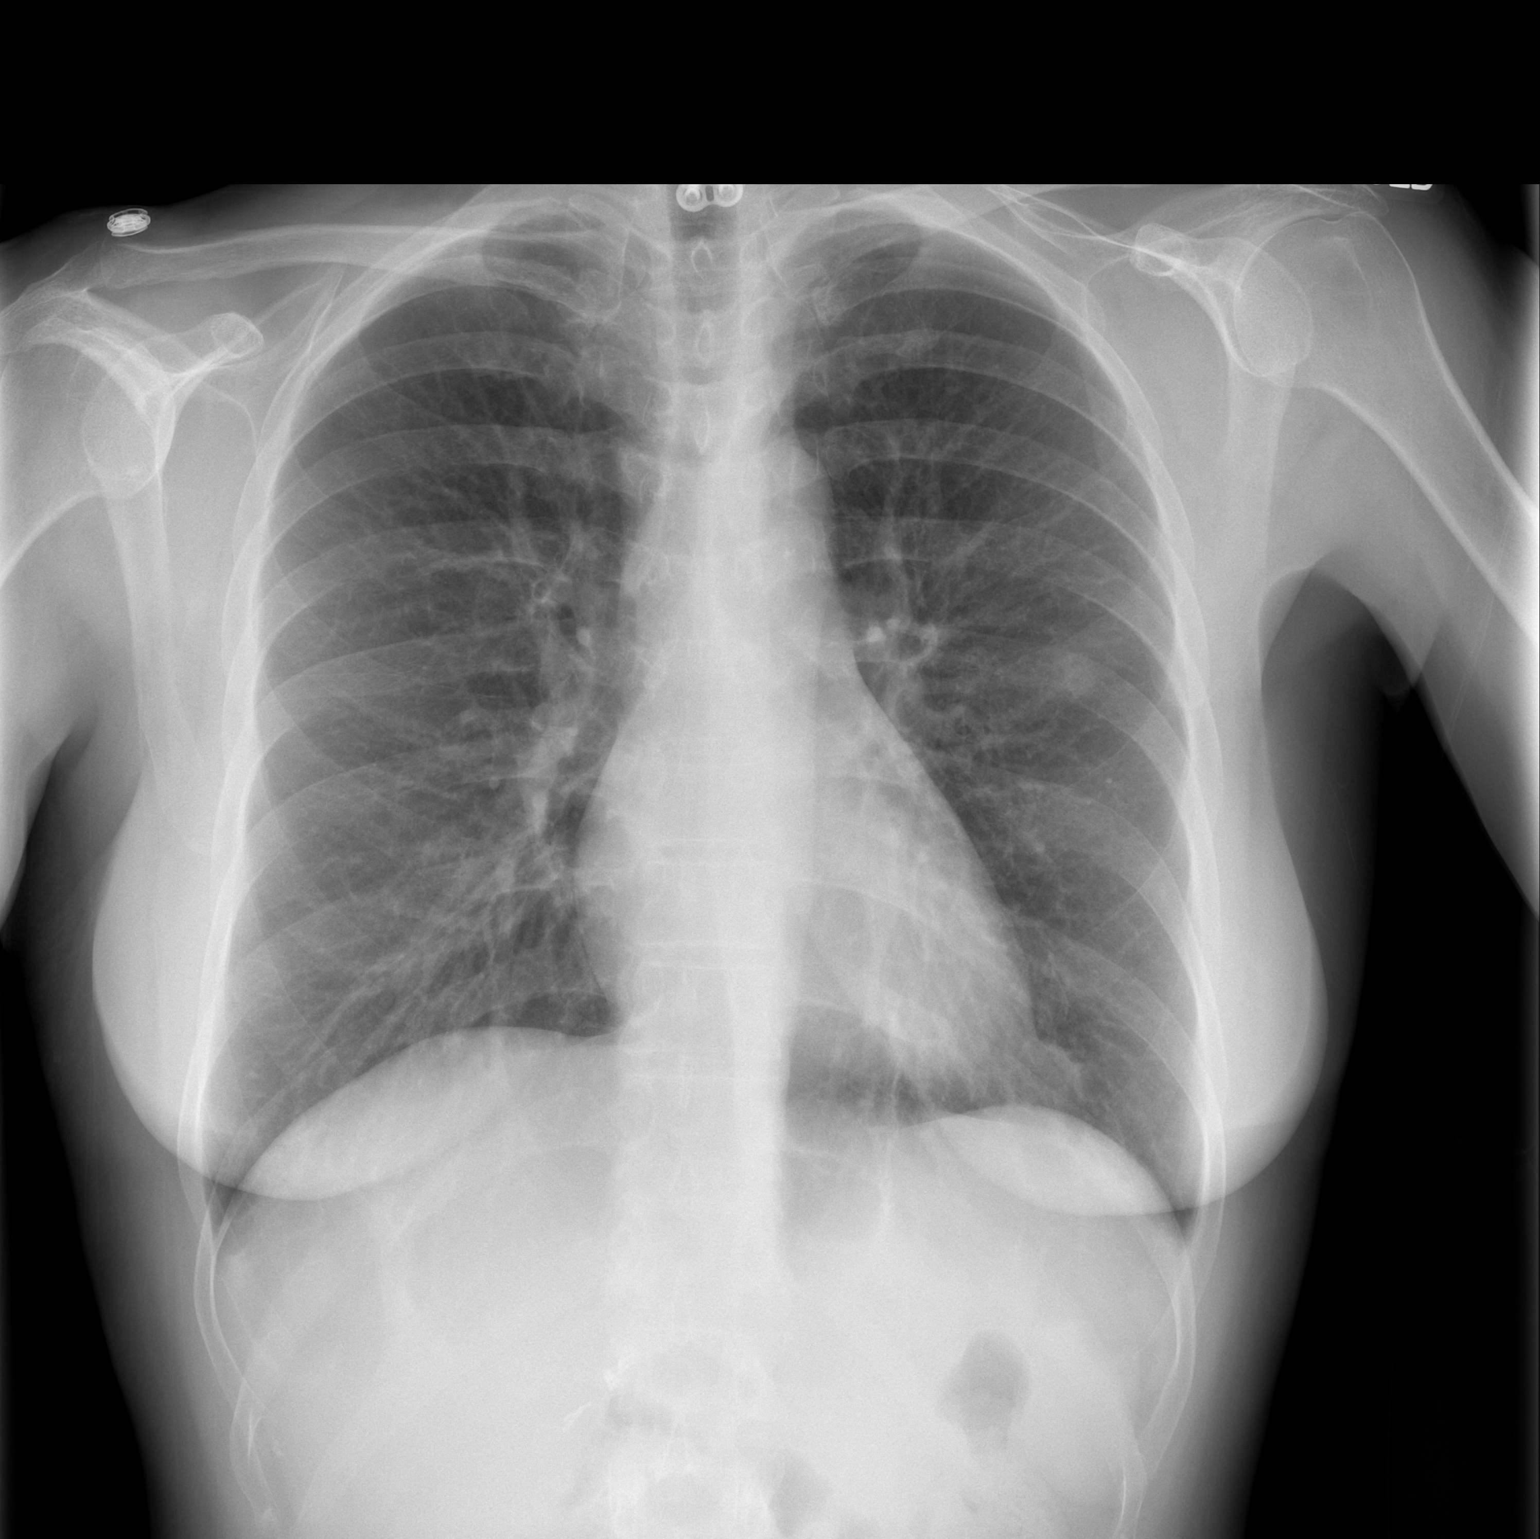

[w chest lat]
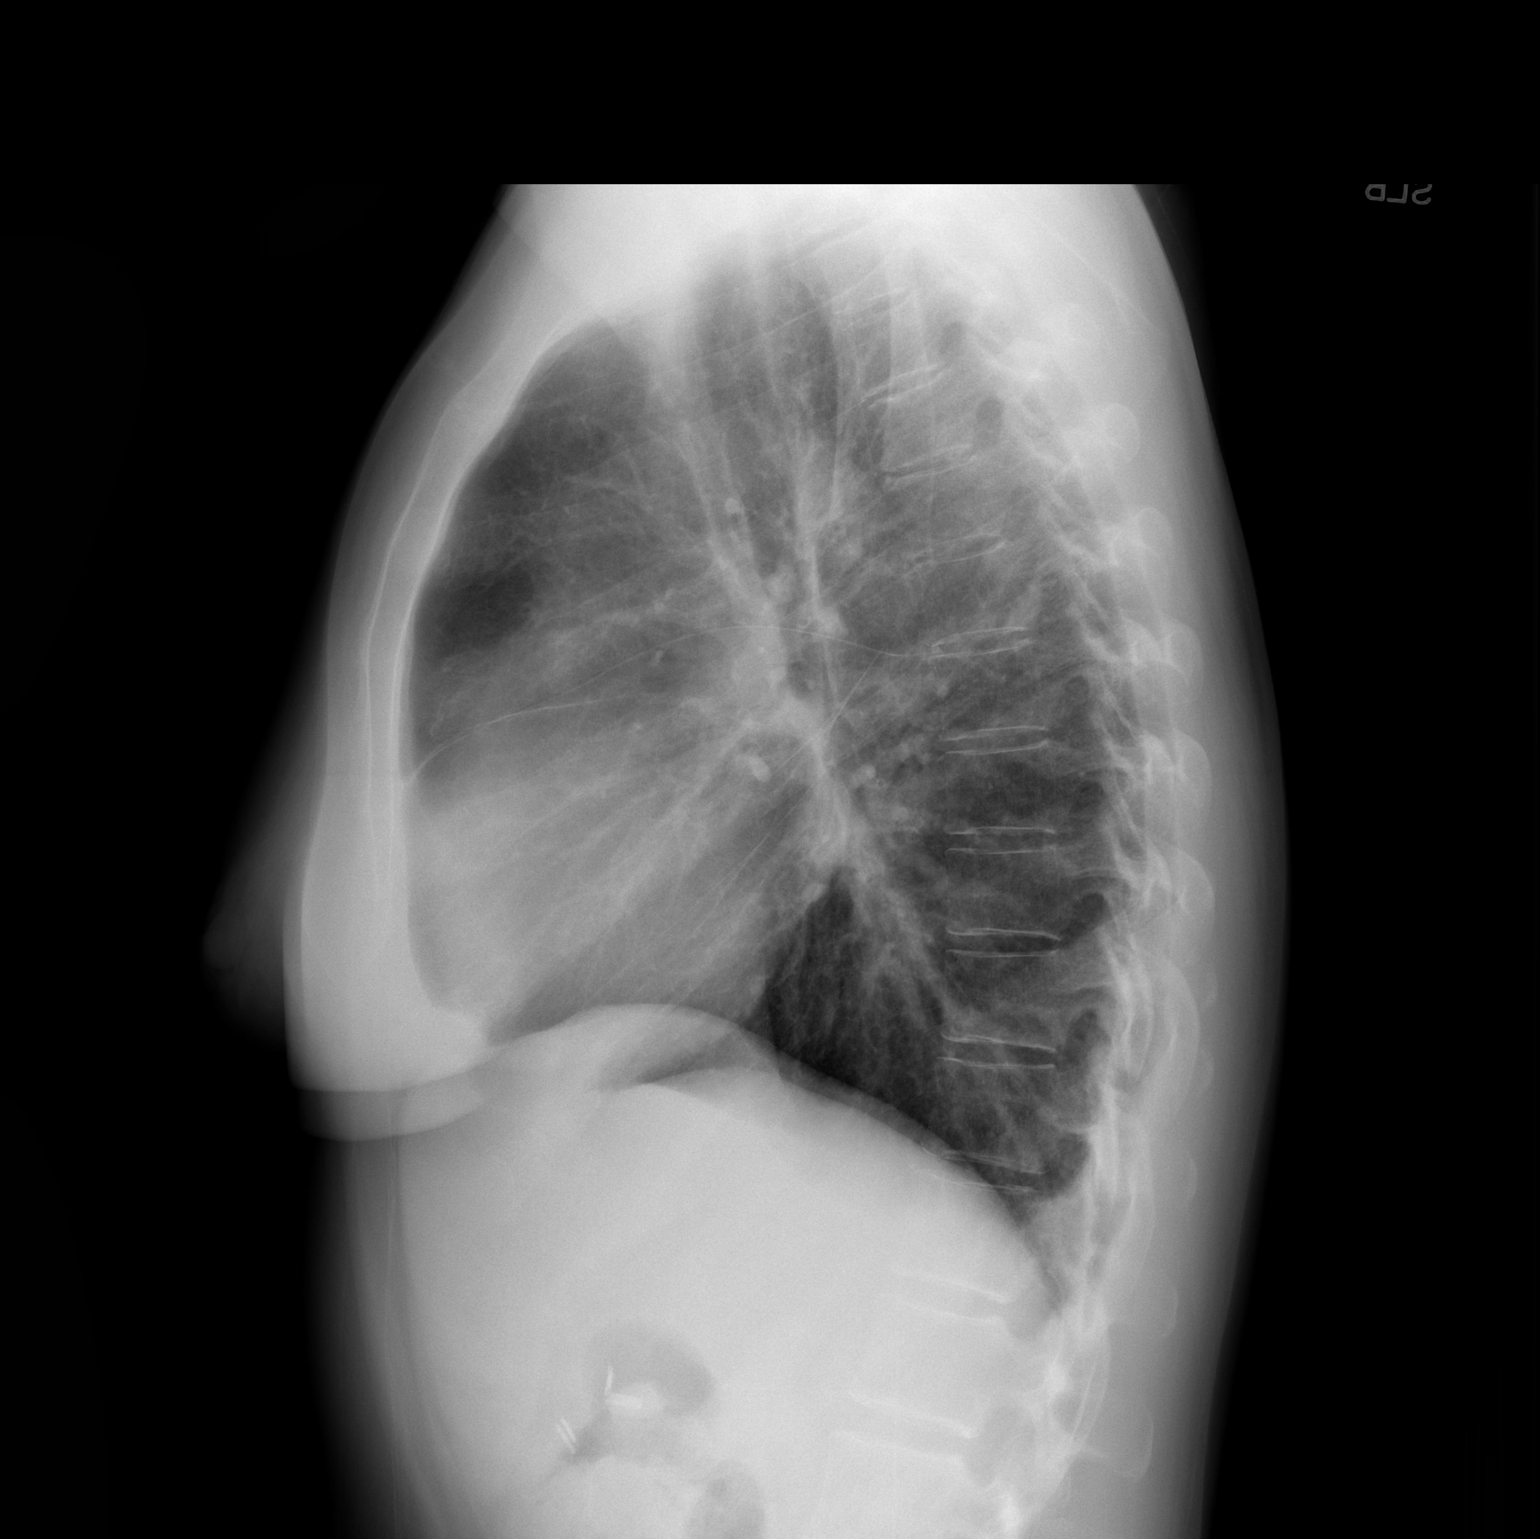

[2 of 2 positions shown; findings below may reference images not displayed]

FINDINGS: Cervical fixation hardware partially seen.  Vascular
clips in the right upper abdomen.  11 mm nodular opacity projects
over the left midlung.  Right lung clear.  Heart size normal.  No
effusion.  Regional bones unremarkable.
IMPRESSION: 1.  Possible left midlung nodule or focal infiltrate.

## 2011-12-17 ENCOUNTER — Emergency Department (HOSPITAL_COMMUNITY)
Admission: EM | Admit: 2011-12-17 | Discharge: 2011-12-17 | Disposition: A | Discharge status: Home / Self Care | Payer: BC Managed Care – PPO | Attending: Emergency Medicine | Admitting: Emergency Medicine

## 2011-12-17 ENCOUNTER — Encounter (HOSPITAL_COMMUNITY): Payer: Self-pay | Admitting: *Deleted

## 2011-12-17 ENCOUNTER — Emergency Department (HOSPITAL_COMMUNITY): Payer: BC Managed Care – PPO

## 2011-12-17 DIAGNOSIS — M549 Dorsalgia, unspecified: Secondary | ICD-10-CM | POA: Insufficient documentation

## 2011-12-17 DIAGNOSIS — G8929 Other chronic pain: Secondary | ICD-10-CM | POA: Insufficient documentation

## 2011-12-17 DIAGNOSIS — I10 Essential (primary) hypertension: Secondary | ICD-10-CM | POA: Insufficient documentation

## 2011-12-17 DIAGNOSIS — E039 Hypothyroidism, unspecified: Secondary | ICD-10-CM | POA: Insufficient documentation

## 2011-12-17 DIAGNOSIS — Z79899 Other long term (current) drug therapy: Secondary | ICD-10-CM | POA: Insufficient documentation

## 2011-12-17 DIAGNOSIS — I2510 Atherosclerotic heart disease of native coronary artery without angina pectoris: Secondary | ICD-10-CM | POA: Insufficient documentation

## 2011-12-17 DIAGNOSIS — R079 Chest pain, unspecified: Secondary | ICD-10-CM | POA: Insufficient documentation

## 2011-12-17 DIAGNOSIS — E785 Hyperlipidemia, unspecified: Secondary | ICD-10-CM | POA: Insufficient documentation

## 2011-12-17 DIAGNOSIS — I509 Heart failure, unspecified: Secondary | ICD-10-CM | POA: Insufficient documentation

## 2011-12-17 DIAGNOSIS — E2749 Other adrenocortical insufficiency: Secondary | ICD-10-CM | POA: Insufficient documentation

## 2011-12-17 DIAGNOSIS — Z7982 Long term (current) use of aspirin: Secondary | ICD-10-CM | POA: Insufficient documentation

## 2011-12-17 HISTORY — DX: Nonrheumatic mitral (valve) prolapse: I34.1

## 2011-12-17 LAB — POCT I-STAT TROPONIN I: Troponin i, poc: 0 ng/mL (ref 0.00–0.08)

## 2011-12-17 LAB — CBC
HCT: 44.2 % (ref 36.0–46.0)
Hemoglobin: 15.3 g/dL — ABNORMAL HIGH (ref 12.0–15.0)
MCV: 83.6 fL (ref 78.0–100.0)
WBC: 15.3 10*3/uL — ABNORMAL HIGH (ref 4.0–10.5)

## 2011-12-17 LAB — POCT I-STAT, CHEM 8
Chloride: 106 mEq/L (ref 96–112)
Creatinine, Ser: 0.8 mg/dL (ref 0.50–1.10)
Glucose, Bld: 87 mg/dL (ref 70–99)
Potassium: 3.7 mEq/L (ref 3.5–5.1)

## 2011-12-17 MED ORDER — FENTANYL CITRATE 0.05 MG/ML IJ SOLN
50.0000 ug | Freq: Once | INTRAMUSCULAR | Status: AC
  Administered 2011-12-17: 50 ug via INTRAVENOUS
  Filled 2011-12-17: qty 2

## 2011-12-17 MED ORDER — ONDANSETRON HCL 4 MG/2ML IJ SOLN
4.0000 mg | Freq: Once | INTRAMUSCULAR | Status: AC
  Administered 2011-12-17: 4 mg via INTRAVENOUS
  Filled 2011-12-17: qty 2

## 2011-12-17 MED ORDER — ONDANSETRON HCL 4 MG PO TABS: 4.0000 mg | ORAL_TABLET | Freq: Four times a day (QID) | ORAL | Status: AC

## 2011-12-17 NOTE — ED Notes (Signed)
Pt ambulatory to restroom with steady gait, no s/s of any distress noted. Plan of care is updated with verbal understanding. Family at  Bedside, will continue to monitor pt.

## 2011-12-17 NOTE — ED Notes (Addendum)
Pt c/o (R) side back pain radiating into (R) chest, sob, and nausea x4 days. Pt reports she is unsure if this is increased stress d/t brother in ICU or if she is going into "Cardiac arrest d/t Addison disease." Pt reports she has taken ASA 324 mg, Ativan 2 mg, and Oxycodone IR 30 mg every 6 hrs w/o any relief. Pt denies diaphoresis, vomiting, or abd pain

## 2011-12-17 NOTE — Discharge Instructions (Signed)
Chest Pain (Nonspecific) It is often hard to give a specific diagnosis for the cause of chest pain. There is always a chance that your pain could be related to something serious, such as a heart attack or a blood clot in the lungs. You need to follow up with your caregiver for further evaluation. CAUSES   Heartburn.   Pneumonia or bronchitis.   Anxiety or stress.   Inflammation around your heart (pericarditis) or lung (pleuritis or pleurisy).   A blood clot in the lung.   A collapsed lung (pneumothorax). It can develop suddenly on its own (spontaneous pneumothorax) or from injury (trauma) to the chest.   Shingles infection (herpes zoster virus).  The chest wall is composed of bones, muscles, and cartilage. Any of these can be the source of the pain.  The bones can be bruised by injury.   The muscles or cartilage can be strained by coughing or overwork.   The cartilage can be affected by inflammation and become sore (costochondritis).  DIAGNOSIS  Lab tests or other studies, such as X-rays, electrocardiography, stress testing, or cardiac imaging, may be needed to find the cause of your pain.  TREATMENT   Treatment depends on what may be causing your chest pain. Treatment may include:   Acid blockers for heartburn.   Anti-inflammatory medicine.   Pain medicine for inflammatory conditions.   Antibiotics if an infection is present.   You may be advised to change lifestyle habits. This includes stopping smoking and avoiding alcohol, caffeine, and chocolate.   You may be advised to keep your head raised (elevated) when sleeping. This reduces the chance of acid going backward from your stomach into your esophagus.   Most of the time, nonspecific chest pain will improve within 2 to 3 days with rest and mild pain medicine.  HOME CARE INSTRUCTIONS   If antibiotics were prescribed, take your antibiotics as directed. Finish them even if you start to feel better.   For the next few  days, avoid physical activities that bring on chest pain. Continue physical activities as directed.   Do not smoke.   Avoid drinking alcohol.   Only take over-the-counter or prescription medicine for pain, discomfort, or fever as directed by your caregiver.   Follow your caregiver's suggestions for further testing if your chest pain does not go away.   Keep any follow-up appointments you made. If you do not go to an appointment, you could develop lasting (chronic) problems with pain. If there is any problem keeping an appointment, you must call to reschedule.  SEEK MEDICAL CARE IF:   You think you are having problems from the medicine you are taking. Read your medicine instructions carefully.   Your chest pain does not go away, even after treatment.   You develop a rash with blisters on your chest.  SEEK IMMEDIATE MEDICAL CARE IF:   You have increased chest pain or pain that spreads to your arm, neck, jaw, back, or abdomen.   You develop shortness of breath, an increasing cough, or you are coughing up blood.   You have severe back or abdominal pain, feel nauseous, or vomit.   You develop severe weakness, fainting, or chills.   You have a fever.  THIS IS AN EMERGENCY. Do not wait to see if the pain will go away. Get medical help at once. Call your local emergency services (911 in [place] Do not drive yourself to the hospital. MAKE SURE YOU:   Understand these instructions.     Will watch your condition.   Will get help right away if you are not doing well or get worse.  Document Released: 06/02/2005 Document Revised: 08/12/2011 Document Reviewed: 03/28/2008 [employer] Patient Information 2012 [employer]. 

## 2011-12-17 NOTE — ED Notes (Signed)
Pt requested and was provided a warm blanket

## 2011-12-17 NOTE — ED Notes (Signed)
Pt reports 4 day hx back pain radiating to the front of her chest with nausea. Reports took 4 baby asa this am. States that she took her pain medication at 7 am and it did not help with her pain.

## 2011-12-17 NOTE — ED Notes (Signed)
Pt denies any questions upon discharge. 

## 2011-12-17 NOTE — ED Provider Notes (Signed)
History     CSN: [id number]  Arrival date & time 12/17/11  1223   First MD Initiated Contact with Patient 12/17/11 1635      Chief Complaint  Patient presents with  . Chest Pain    (Consider location/radiation/quality/duration/timing/severity/associated sxs/prior treatment) HPI  Cardiologist: Dr. Burman Larson [name] is a 46 y.o. female who presents to the ED with complaints of chest pain. She states that her brother is in the ICU currently here at [facility] after having had a heart attack. She states that she takes Ambien, ativan and Oxycodone at home for chronic back pain. The patient states that there is a good chance that she is just very anxious but someone told her that she is probably having a heart attack. Her pain has been persisting for 4 days. She states that it has been coming and going. She states that she  took 4 baby aspirin this morning and has taken nitro as well. The patient appears very anxious but is in no acute distress at this time and VSS.   Past Medical History  Diagnosis Date  . Cardiomyopathy     resolved  . Chest pain     chronicc  . Hyperlipidemia   . HTN (hypertension)   . Hypothyroidism   . Adrenal insufficiency   . Anxiety   . Nondiabetic gastroparesis   . CAD (coronary artery disease)     Cath 2008 EF normal. RCA 50-60, Septal 50%. Myoview 3/12: EF 53% normal perfusion  . Chronic back pain   . D (diarrhea)   . History of CHF (congestive heart failure)   . QT prolongation   . Addison disease   . Mitral valve prolapse     Past Surgical History  Procedure Date  . Cardiac catheterization 03/2007    showed 60% lesion in the right coronary artery  . Cholecystectomy   . Spine surgery   . Vesicovaginal fistula closure w/ tah   . Varicose vein surgery   . Abdominal hysterectomy     Family History  Problem Relation Age of Onset  . Coronary artery disease    . Heart attack Mother   . Cancer Father     Colon    History    Substance Use Topics  . Smoking status: Current Everyday Smoker    Types: Cigarettes  . Smokeless tobacco: Not on file   Comment: smokes 6-7 cigarettes per day  . Alcohol Use: No    OB History    Grav Para Term Preterm Abortions TAB SAB Ect Mult Living                  Review of Systems  All other systems reviewed and are negative.    Allergies  Bee venom; Penicillins; Doxycycline; Dust mite extract; Erythromycin; Keflex; Morphine and related; Sulfonamide derivatives; and Tramadol  Home Medications   Current Outpatient Rx  Name Route Sig Dispense Refill  . ASPIRIN 81 MG PO CHEW Oral Chew 324 mg by mouth once.    . ASPIRIN 81 MG PO CHEW Oral Chew 81 mg by mouth daily.    . CYCLOBENZAPRINE HCL 10 MG PO TABS Oral Take 10 mg by mouth 3 (three) times daily as needed. For muscle spasms    . HYDROCORTISONE 10 MG PO TABS Oral Take 10-20 mg by mouth 2 (two) times daily. Take 2 tablets in the morning and 1 tablets at night     . LEVOTHYROXINE SODIUM 100 MCG PO  TABS Oral Take 100 mcg by mouth daily.     Marland Kitchen LORAZEPAM 1 MG PO TABS Oral Take 1 mg by mouth 4 (four) times daily.     Marland Kitchen METOCLOPRAMIDE HCL 10 MG PO TABS Oral Take 20 mg by mouth 3 (three) times daily before meals. As needed    . ONDANSETRON HCL 4 MG PO TABS Oral Take 4 mg by mouth every 4 (four) hours as needed. For nausea    . OXYCODONE HCL 15 MG PO TABS Oral Take 30 mg by mouth every 6 (six) hours as needed. For pain    . PANTOPRAZOLE SODIUM 40 MG PO TBEC Oral Take 40 mg by mouth daily as needed. For acid reflux    . ZOLPIDEM TARTRATE 10 MG PO TABS Oral Take 10 mg by mouth at bedtime.      BP 137/89  Pulse 82  Temp(Src) 98.1 F (36.7 C) (Oral)  Resp 13  SpO2 98%  Physical Exam  Nursing note and vitals reviewed. Constitutional: She appears well-developed and well-nourished. No distress.       Pt appears anxious  HENT:  Head: Normocephalic and atraumatic.  Eyes: Pupils are equal, round, and reactive to light.   Neck: Normal range of motion. Neck supple.  Cardiovascular: Normal rate and regular rhythm.   Pulmonary/Chest: Effort normal. No respiratory distress. She has no wheezes. She has no rales. She exhibits no tenderness.  Abdominal: Soft.  Neurological: She is alert.  Skin: Skin is warm and dry.    ED Course  Procedures (including critical care time)  Labs Reviewed  CBC - Abnormal; Notable for the following:    WBC 15.3 (*)    RBC 5.29 (*)    Hemoglobin 15.3 (*)    All other components within normal limits  POCT I-STAT, CHEM 8 - Abnormal; Notable for the following:    Hemoglobin 16.3 (*)    HCT 48.0 (*)    All other components within normal limits  POCT I-STAT TROPONIN I  TROPONIN I   Dg Chest 2 View  12/17/2011  *RADIOLOGY REPORT*  Clinical Data: Chest pain.  CHEST - 2 VIEW  Comparison: 10/31/2011  Findings: Heart and mediastinal contours are within normal limits. No focal opacities or effusions.  No acute bony abnormality.  IMPRESSION:  Original Report Authenticated By: Cyndie Chime, M.D.     No diagnosis found.    MDM   Date: 12/17/2011  Rate: 91  Rhythm: normal sinus rhythm  QRS Axis: normal  Intervals: normal  ST/T Wave abnormalities: normal  Conduction Disutrbances:none  Narrative Interpretation:   Old EKG Reviewed: none available and unchanged from March 26, 2011   Per Doctor Bensimhons note in 2012 "[name] is 46 y/o woman with multiple medical problems including Addison's disease, idiopathic gastroparesis, hypothyroidism and CAD. Underwent cardiac cath in 2006 and 2008. In 2008 had mild non-obstructive CAD with 50-560% lesion in RCA and septal perforator. Also has h/o NICM with EF 40-45%. Most recent EF was 55-60%.  Admitted at the end of March with recurrent CP. ECG and cardiac markers were negative. Had Myoview which was normal EF 53% normal perfusion. Since d/c continues to have occasional episodes of CP as well as lightheadedness. CP 2-3x/wk. Usually with  emotional stress. Not with exertion. Has been off heart medicines due to hypotension. Does get dizzy at times. Feels like room spinning. Not orthostatic. No syncope. No CHF. Takes BP at pharmacy 145/90s. Also occasional palpitations - 2-3x/week for 10-15 mins. "  Patient is currently under a lot of emotional stress with her brother being in the ICU. She had had a normal EKG and two negative Troponins. I do not believe that this pain is due to cardiac disease.  Pt examined by doctor Effie Shy as well who agrees with my plan. Patient can be discharged with pain medication adn close follow-up with cardiology.   Pt has been advised of the symptoms that warrant their return to the ED. Patient has voiced understanding and has agreed to follow-up with the PCP or specialist.          Dorthula Matas, PA 12/17/11 2138

## 2011-12-17 NOTE — ED Provider Notes (Signed)
Medical screening examination/treatment/procedure(s) were conducted as a shared visit with non-physician practitioner(s) and myself.  I personally evaluated the patient during the encounter    Currently , the patient is comfortable (21:38). Her vital signs are normal. The pain is atypical for cardiac disease and evaluation for cardiac ischemia and infarct is negative. She is stable for discharge with outpatient management.  Flint Melter, MD 12/17/11 2138

## 2011-12-18 NOTE — ED Provider Notes (Signed)
Medical screening examination/treatment/procedure(s) were performed by non-physician practitioner and as supervising physician I was immediately available for consultation/collaboration.  Baer Hinton L Klyde Banka, MD 12/18/11 0142 

## 2012-02-02 ENCOUNTER — Emergency Department (HOSPITAL_COMMUNITY)
Admission: EM | Admit: 2012-02-02 | Discharge: 2012-02-03 | Disposition: A | Discharge status: Home / Self Care | Payer: BC Managed Care – PPO | Attending: Emergency Medicine | Admitting: Emergency Medicine

## 2012-02-02 ENCOUNTER — Encounter (HOSPITAL_COMMUNITY): Payer: Self-pay | Admitting: *Deleted

## 2012-02-02 DIAGNOSIS — I2510 Atherosclerotic heart disease of native coronary artery without angina pectoris: Secondary | ICD-10-CM | POA: Insufficient documentation

## 2012-02-02 DIAGNOSIS — G8929 Other chronic pain: Secondary | ICD-10-CM | POA: Insufficient documentation

## 2012-02-02 DIAGNOSIS — R1110 Vomiting, unspecified: Secondary | ICD-10-CM | POA: Insufficient documentation

## 2012-02-02 DIAGNOSIS — E039 Hypothyroidism, unspecified: Secondary | ICD-10-CM | POA: Insufficient documentation

## 2012-02-02 DIAGNOSIS — I509 Heart failure, unspecified: Secondary | ICD-10-CM | POA: Insufficient documentation

## 2012-02-02 DIAGNOSIS — E785 Hyperlipidemia, unspecified: Secondary | ICD-10-CM | POA: Insufficient documentation

## 2012-02-02 DIAGNOSIS — I10 Essential (primary) hypertension: Secondary | ICD-10-CM | POA: Insufficient documentation

## 2012-02-02 DIAGNOSIS — Z79899 Other long term (current) drug therapy: Secondary | ICD-10-CM | POA: Insufficient documentation

## 2012-02-02 DIAGNOSIS — R197 Diarrhea, unspecified: Secondary | ICD-10-CM | POA: Insufficient documentation

## 2012-02-02 NOTE — ED Notes (Signed)
PT with hx of addison's dz to ED c/o diarrhea x 3 days (25 x's yesterday) and emesis today.  She has been unable to take her medication.  Pt c/o abd back pain and abd bloating.

## 2012-02-03 LAB — BASIC METABOLIC PANEL
CO2: 23 mEq/L (ref 19–32)
Chloride: 107 mEq/L (ref 96–112)
Glucose, Bld: 95 mg/dL (ref 70–99)
Potassium: 3.4 mEq/L — ABNORMAL LOW (ref 3.5–5.1)
Sodium: 141 mEq/L (ref 135–145)

## 2012-02-03 LAB — DIFFERENTIAL
Lymphocytes Relative: 33 % (ref 12–46)
Lymphs Abs: 4.3 10*3/uL — ABNORMAL HIGH (ref 0.7–4.0)
Neutrophils Relative %: 57 % (ref 43–77)

## 2012-02-03 LAB — CBC
Hemoglobin: 14.3 g/dL (ref 12.0–15.0)
Platelets: 299 10*3/uL (ref 150–400)
RBC: 4.98 MIL/uL (ref 3.87–5.11)
WBC: 13.1 10*3/uL — ABNORMAL HIGH (ref 4.0–10.5)

## 2012-02-03 MED ORDER — ONDANSETRON HCL 4 MG/2ML IJ SOLN
4.0000 mg | Freq: Once | INTRAMUSCULAR | Status: AC
  Administered 2012-02-03: 4 mg via INTRAVENOUS
  Filled 2012-02-03: qty 2

## 2012-02-03 MED ORDER — HYDROCORTISONE NICU INJ SYRINGE 50 MG/ML: 100.0000 mg | Freq: Once | INTRAVENOUS | Status: DC

## 2012-02-03 MED ORDER — SODIUM CHLORIDE 0.9 % IV BOLUS (SEPSIS)
1000.0000 mL | Freq: Once | INTRAVENOUS | Status: AC
  Administered 2012-02-03: 1000 mL via INTRAVENOUS

## 2012-02-03 MED ORDER — PROMETHAZINE HCL 25 MG PO TABS: 25.0000 mg | ORAL_TABLET | Freq: Four times a day (QID) | ORAL | Status: DC | PRN

## 2012-02-03 MED ORDER — HYDROMORPHONE HCL PF 1 MG/ML IJ SOLN
1.0000 mg | Freq: Once | INTRAMUSCULAR | Status: AC
  Administered 2012-02-03: 1 mg via INTRAVENOUS
  Filled 2012-02-03: qty 1

## 2012-02-03 MED ORDER — HYDROCORTISONE SOD SUCCINATE 100 MG IJ SOLR
100.0000 mg | Freq: Once | INTRAMUSCULAR | Status: AC
  Administered 2012-02-03: 100 mg via INTRAVENOUS
  Filled 2012-02-03: qty 2

## 2012-02-03 MED ORDER — METOCLOPRAMIDE HCL 5 MG/ML IJ SOLN
10.0000 mg | Freq: Once | INTRAMUSCULAR | Status: AC
  Administered 2012-02-03: 10 mg via INTRAVENOUS
  Filled 2012-02-03: qty 2

## 2012-02-03 NOTE — Discharge Instructions (Signed)
Follow up with your md if not improving. °

## 2012-02-03 NOTE — ED Provider Notes (Cosign Needed)
History     CSN: [id number]  Arrival date & time 02/02/12  2301   First MD Initiated Contact with Patient 02/03/12 0117      Chief Complaint  Patient presents with  . Diarrhea  . Emesis    (Consider location/radiation/quality/duration/timing/severity/associated sxs/prior treatment) Patient is a 46 y.o. female presenting with vomiting. The history is provided by the patient (the pt complains of vomiting and diarhea). No language interpreter was used.  Emesis  This is a new problem. The current episode started 6 to 12 hours ago. The problem occurs more than 10 times per day. The problem has not changed since onset.The emesis has an appearance of stomach contents. There has been no fever. Associated symptoms include diarrhea. Pertinent negatives include no abdominal pain, no cough and no headaches. Risk factors: addisons disease.    Past Medical History  Diagnosis Date  . Cardiomyopathy     resolved  . Chest pain     chronicc  . Hyperlipidemia   . HTN (hypertension)   . Hypothyroidism   . Adrenal insufficiency   . Anxiety   . Nondiabetic gastroparesis   . CAD (coronary artery disease)     Cath 2008 EF normal. RCA 50-60, Septal 50%. Myoview 3/12: EF 53% normal perfusion  . Chronic back pain   . D (diarrhea)   . History of CHF (congestive heart failure)   . QT prolongation   . Addison disease   . Mitral valve prolapse     Past Surgical History  Procedure Date  . Cardiac catheterization 03/2007    showed 60% lesion in the right coronary artery  . Cholecystectomy   . Spine surgery   . Vesicovaginal fistula closure w/ tah   . Varicose vein surgery   . Abdominal hysterectomy     Family History  Problem Relation Age of Onset  . Coronary artery disease    . Heart attack Mother   . Cancer Father     Colon    History  Substance Use Topics  . Smoking status: Current Everyday Smoker -- 0.5 packs/day    Types: Cigarettes  . Smokeless tobacco: Not on file   Comment:  smokes 6-7 cigarettes per day  . Alcohol Use: No    OB History    Grav Para Term Preterm Abortions TAB SAB Ect Mult Living                  Review of Systems  Constitutional: Negative for fatigue.  HENT: Negative for congestion, sinus pressure and ear discharge.   Eyes: Negative for discharge.  Respiratory: Negative for cough.   Cardiovascular: Negative for chest pain.  Gastrointestinal: Positive for vomiting and diarrhea. Negative for abdominal pain.  Genitourinary: Negative for frequency and hematuria.  Musculoskeletal: Negative for back pain.  Skin: Negative for rash.  Neurological: Negative for seizures and headaches.  Hematological: Negative.   Psychiatric/Behavioral: Negative for hallucinations.    Allergies  Bee venom; Penicillins; Cephalexin; Doxycycline; Dust mite extract; Erythromycin; Morphine and related; Sulfonamide derivatives; and Tramadol  Home Medications   Current Outpatient Rx  Name Route Sig Dispense Refill  . ASPIRIN 81 MG PO CHEW Oral Chew 81 mg by mouth daily.    . CYCLOBENZAPRINE HCL 10 MG PO TABS Oral Take 10 mg by mouth 3 (three) times daily as needed. For muscle spasms    . ESCITALOPRAM OXALATE 10 MG PO TABS Oral Take 10 mg by mouth every morning.    Marland Kitchen HYDROCORTISONE 10 MG PO  TABS Oral Take 10-20 mg by mouth 2 (two) times daily. Take 2 tablets in the morning and 1 tablets at night     . LEVOTHYROXINE SODIUM 100 MCG PO TABS Oral Take 100 mcg by mouth daily.     Marland Kitchen LORAZEPAM 1 MG PO TABS Oral Take 1 mg by mouth 4 (four) times daily as needed. For anxiety    . METOCLOPRAMIDE HCL 10 MG PO TABS Oral Take 20 mg by mouth 3 (three) times daily before meals.     Marland Kitchen ONDANSETRON HCL 4 MG PO TABS Oral Take 4 mg by mouth every 4 (four) hours as needed. For nausea    . OXYCODONE HCL 15 MG PO TABS Oral Take 30 mg by mouth every 6 (six) hours as needed. For pain    . PANTOPRAZOLE SODIUM 40 MG PO TBEC Oral Take 40 mg by mouth daily as needed. For acid reflux    .  ZOLPIDEM TARTRATE 10 MG PO TABS Oral Take 10 mg by mouth at bedtime.    Marland Kitchen PROMETHAZINE HCL 25 MG PO TABS Oral Take 1 tablet (25 mg total) by mouth every 6 (six) hours as needed for nausea. 15 tablet 0    BP 161/84  Pulse 62  Temp(Src) 98.5 F (36.9 C) (Oral)  Resp 18  SpO2 98%  Physical Exam  Constitutional: She is oriented to person, place, and time. She appears well-developed.  HENT:  Head: Normocephalic and atraumatic.  Eyes: Conjunctivae and EOM are normal. No scleral icterus.  Neck: Neck supple. No thyromegaly present.  Cardiovascular: Normal rate and regular rhythm.  Exam reveals no gallop and no friction rub.   No murmur heard. Pulmonary/Chest: No stridor. She has no wheezes. She has no rales. She exhibits no tenderness.  Abdominal: She exhibits no distension. There is no tenderness. There is no rebound.  Musculoskeletal: Normal range of motion. She exhibits no edema.  Lymphadenopathy:    She has no cervical adenopathy.  Neurological: She is oriented to person, place, and time. Coordination normal.  Skin: No rash noted. No erythema.  Psychiatric: She has a normal mood and affect. Her behavior is normal.    ED Course  Procedures (including critical care time)  Labs Reviewed  CBC - Abnormal; Notable for the following:    WBC 13.1 (*)    All other components within normal limits  DIFFERENTIAL - Abnormal; Notable for the following:    Lymphs Abs 4.3 (*)    All other components within normal limits  BASIC METABOLIC PANEL - Abnormal; Notable for the following:    Potassium 3.4 (*)    All other components within normal limits   No results found.   1. Vomiting       MDM          Benny Lennert, MD 02/03/12 [phone]

## 2012-02-26 IMAGING — CR DG LUMBAR SPINE COMPLETE 4+V
5 series · 5 of 5 positions shown · non-contrast
Comparison: Lumbar MRI 03/15/2011.

CLINICAL DATA: 45-year-old female status post fall with pain.

LUMBAR SPINE - COMPLETE 4+ VIEW

[t lumbar spine ap]
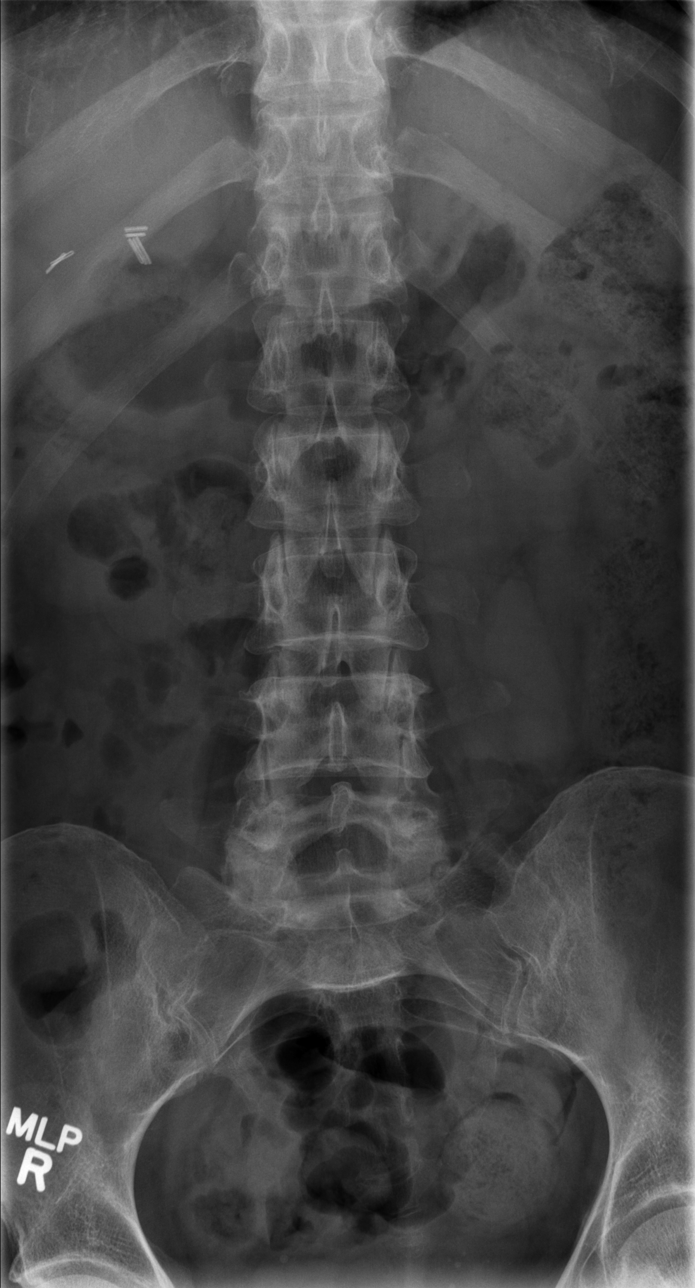

[t lumbar spine obl (1 of 2)]
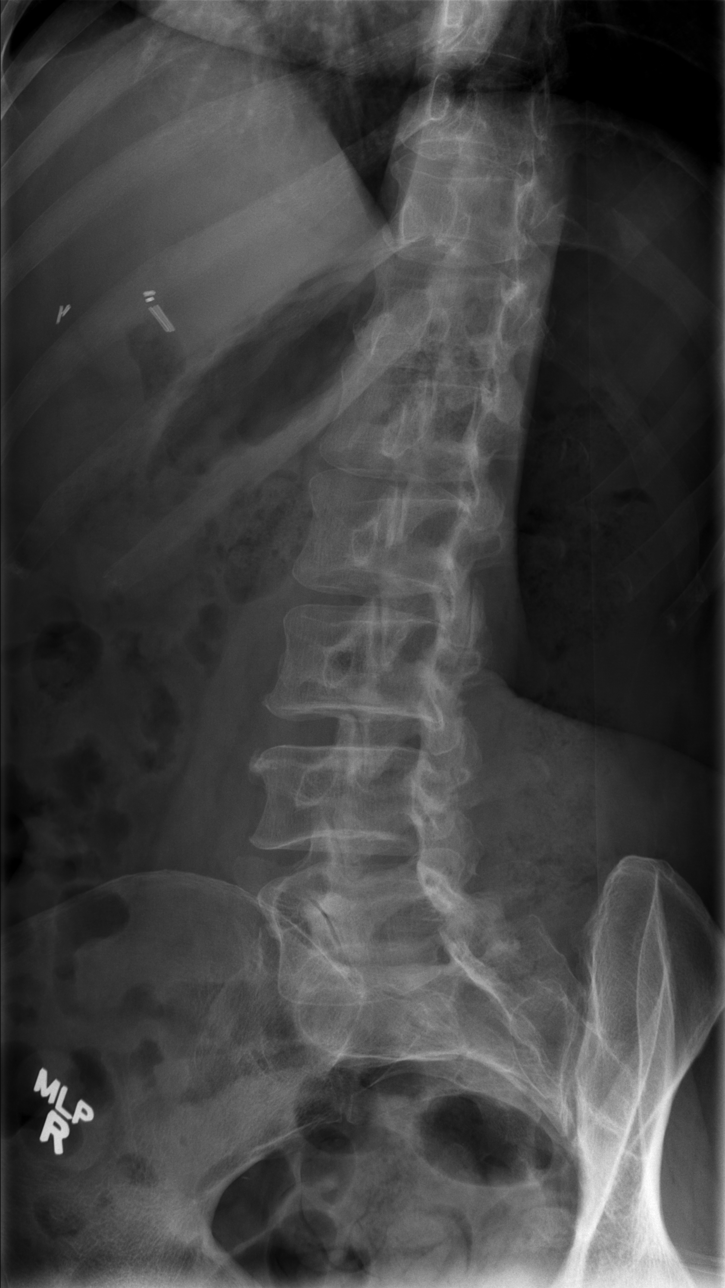

[t lumbar spine obl (2 of 2)]
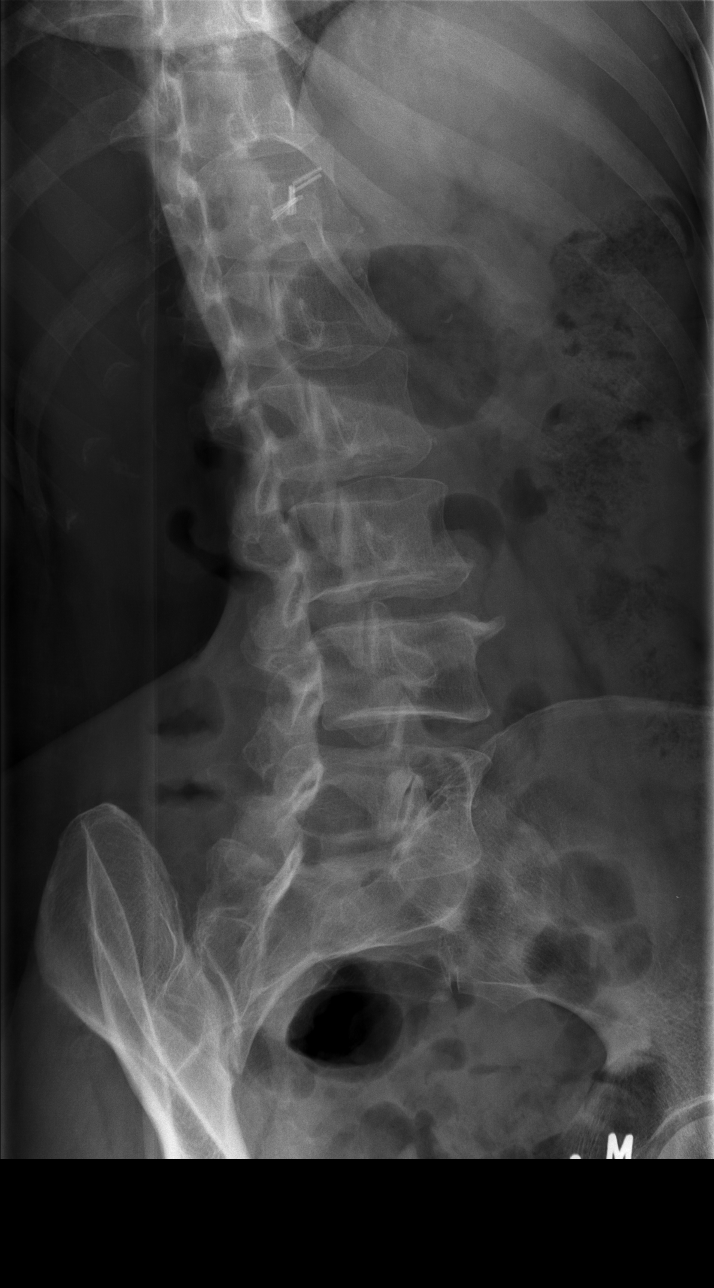

[t lumbar spine lat]
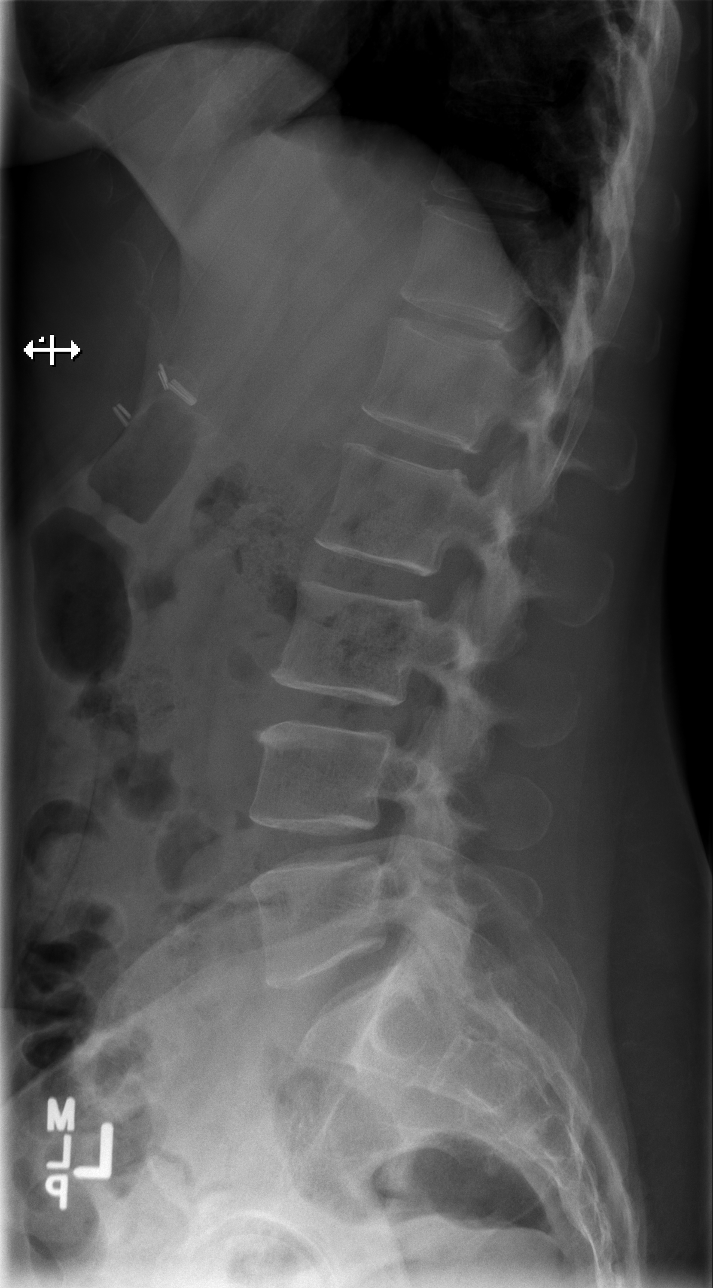

[t lumbar l-5 s-1 spot]
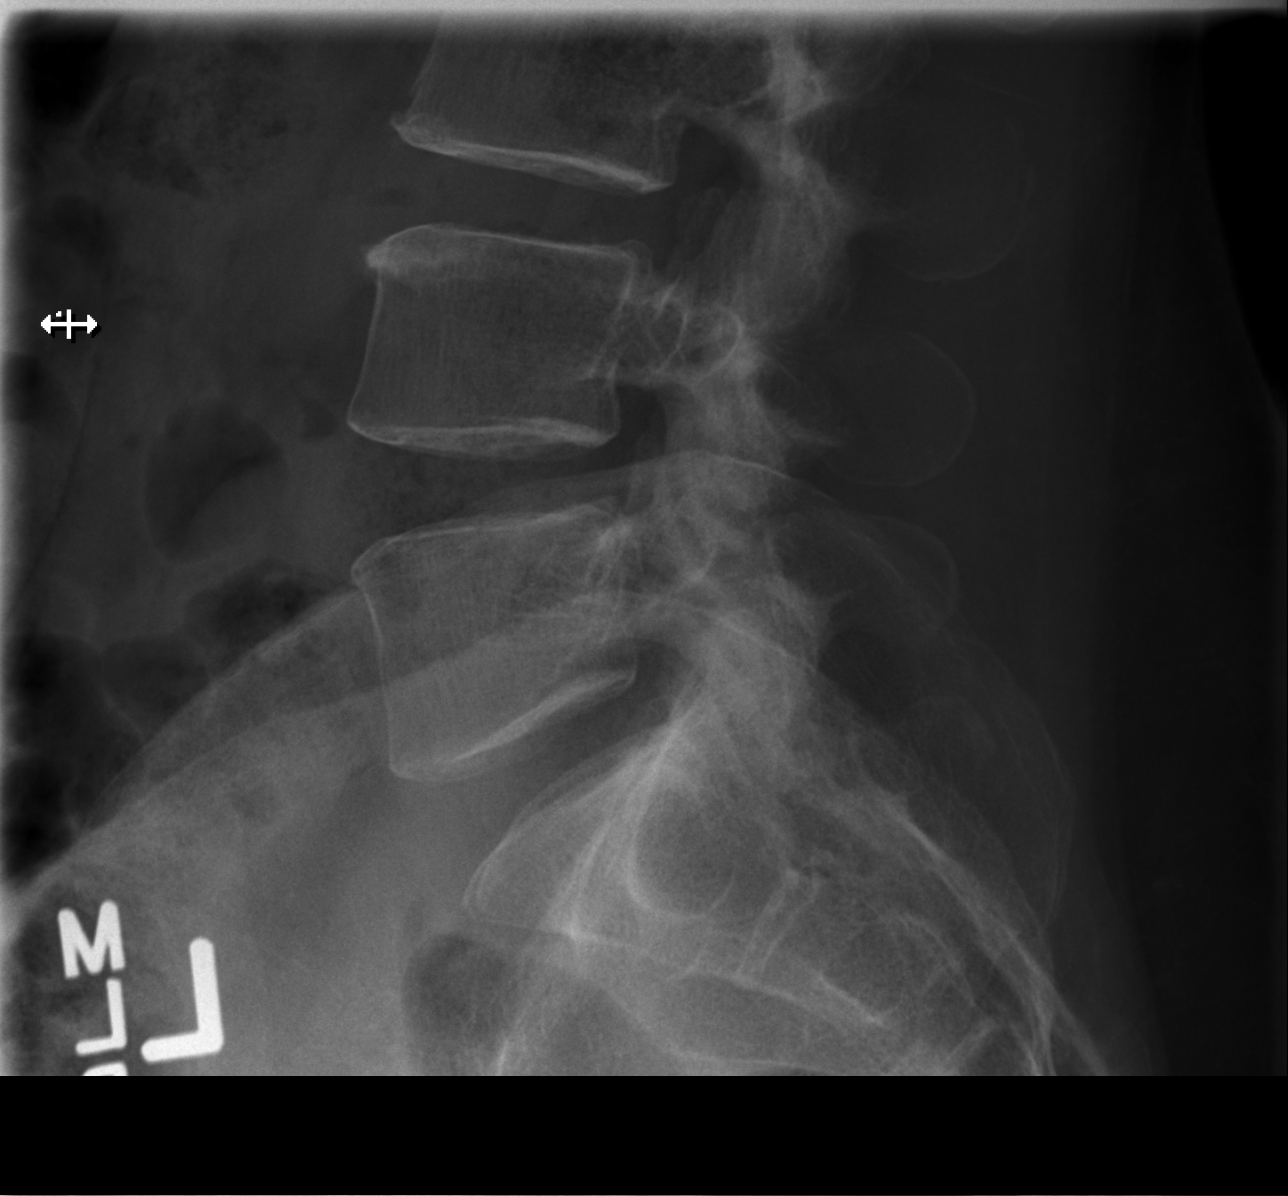

[5 of 5 positions shown; findings below may reference images not displayed]

FINDINGS: Normal lumbar segmentation. Bone mineralization is within
normal limits.  Stable, normal vertebral height and alignment.
Stable relatively preserved disc spaces.  Mild to moderate facet
hypertrophy bilaterally L5-S1.  Sacrum and SI joints within normal
limits.  No pars fracture.
IMPRESSION: No acute osseous abnormality in the lumbar spine.

## 2012-02-26 IMAGING — CR DG CERVICAL SPINE COMPLETE 4+V
5 series · 5 of 5 positions shown · non-contrast
Comparison: 02/12/2010 and earlier.

CLINICAL DATA: 45-year-old female status post fall with pain.

CERVICAL SPINE - COMPLETE 4+ VIEW

[w cervical spine lat]
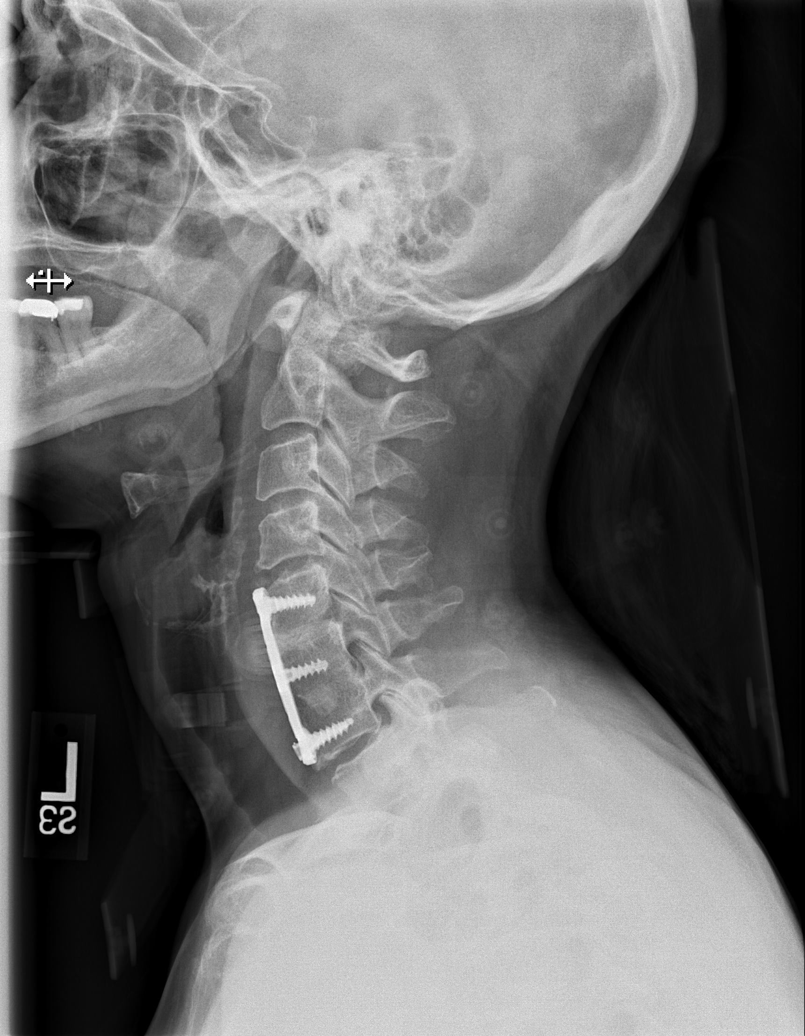

[w cervical spine ap_obl (1 of 2)]
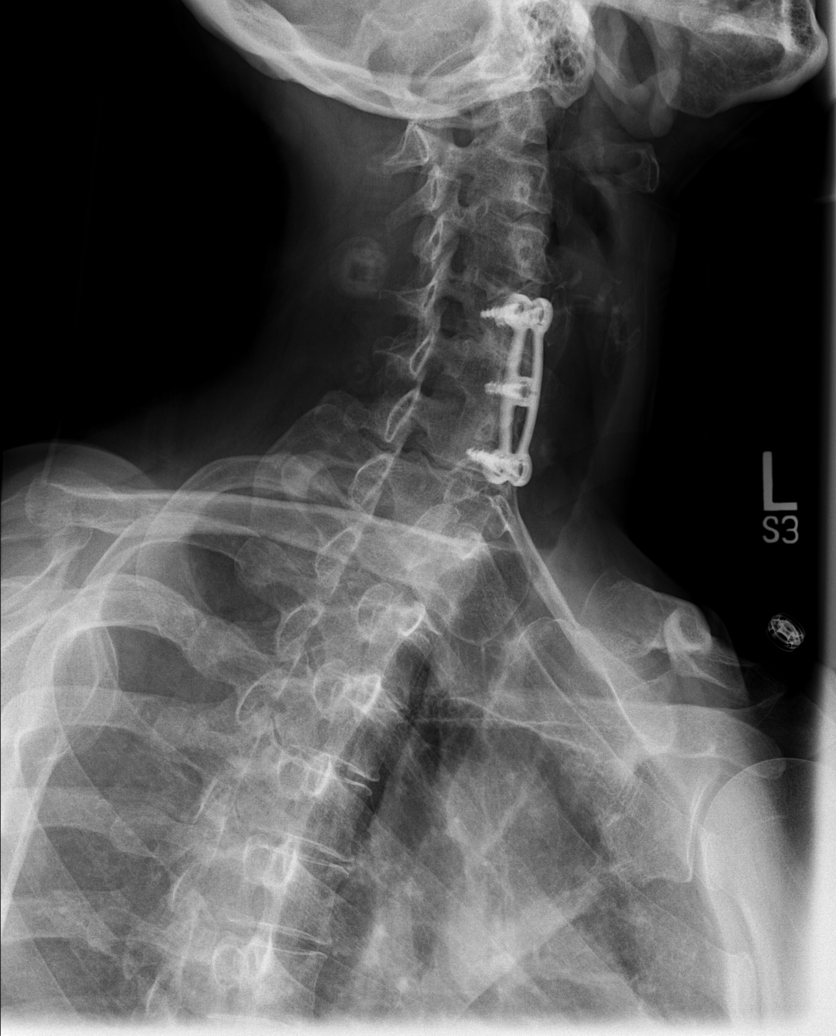

[w cervical spine ap_obl (2 of 2)]
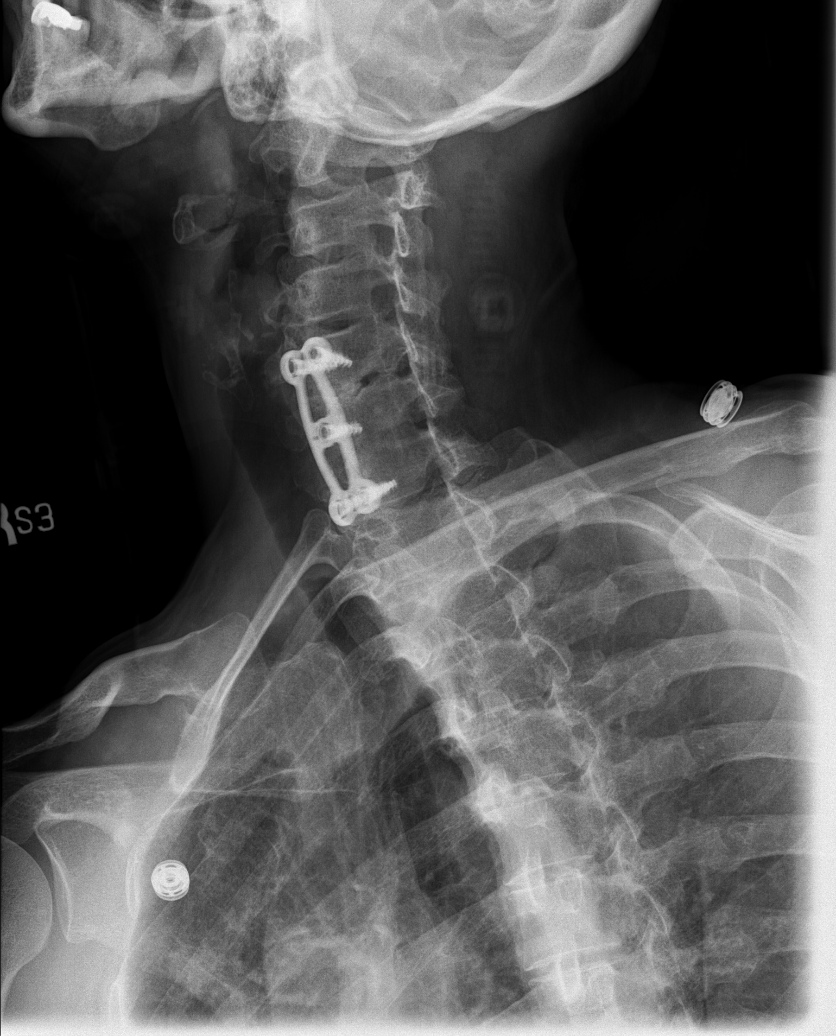

[w cervical spine ap]
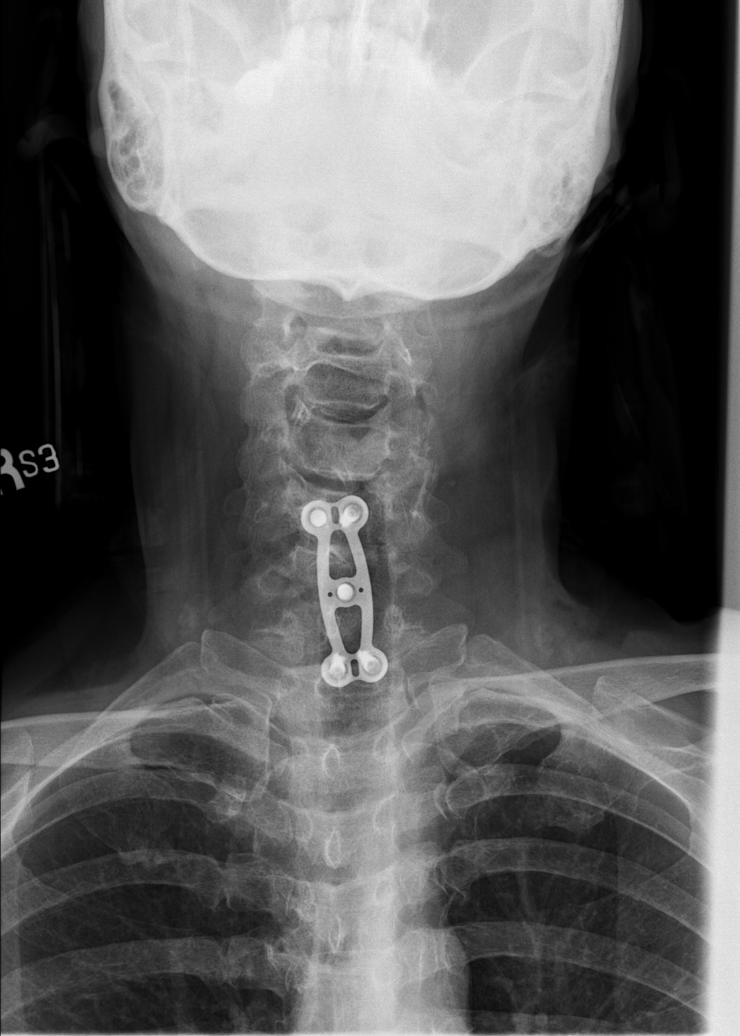

[w cervical spine odontoid]
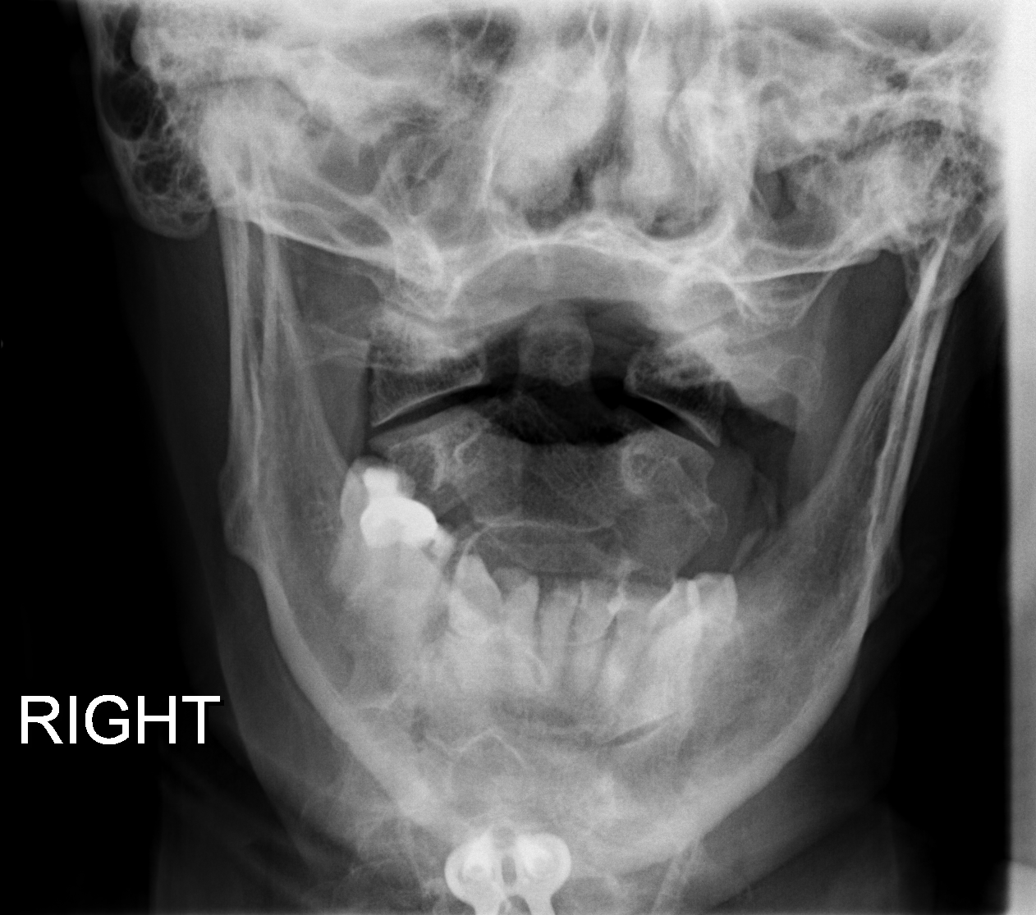

[5 of 5 positions shown; findings below may reference images not displayed]

FINDINGS: Sequelae of C5 to C7 level ACDF again noted.  The C7
level cortical screw does protrude slightly beyond the anterior
plate, but without other lucency or evidence of failure, and this
appearance is similar to the prior study.  Solid osseous fusion and
suspected at these levels.

Stable, normal prevertebral soft tissues. Cervicothoracic junction
alignment is within normal limits.  Relatively preserved cervical
disc spaces at the other levels. Bilateral posterior element
alignment is within normal limits.  Chronic osseous neural
foraminal stenosis appears stable related to uncovertebral
spurring.  AP alignment and lung apices within normal limits.  C1-
C2 alignment and odontoid within normal limits.
IMPRESSION: No acute fracture or listhesis identified in the cervical spine.
Ligamentous injury is not excluded.  Stable appearance of C5-C6 and
C6-C7 ACDF without adverse features.

## 2012-02-26 IMAGING — CR DG HAND COMPLETE 3+V*R*
3 series · 3 of 3 positions shown · non-contrast
Comparison: 06/08/2011

CLINICAL DATA: Traumatic injury with pain

RIGHT HAND - COMPLETE 3+ VIEW

[x hand pa right]
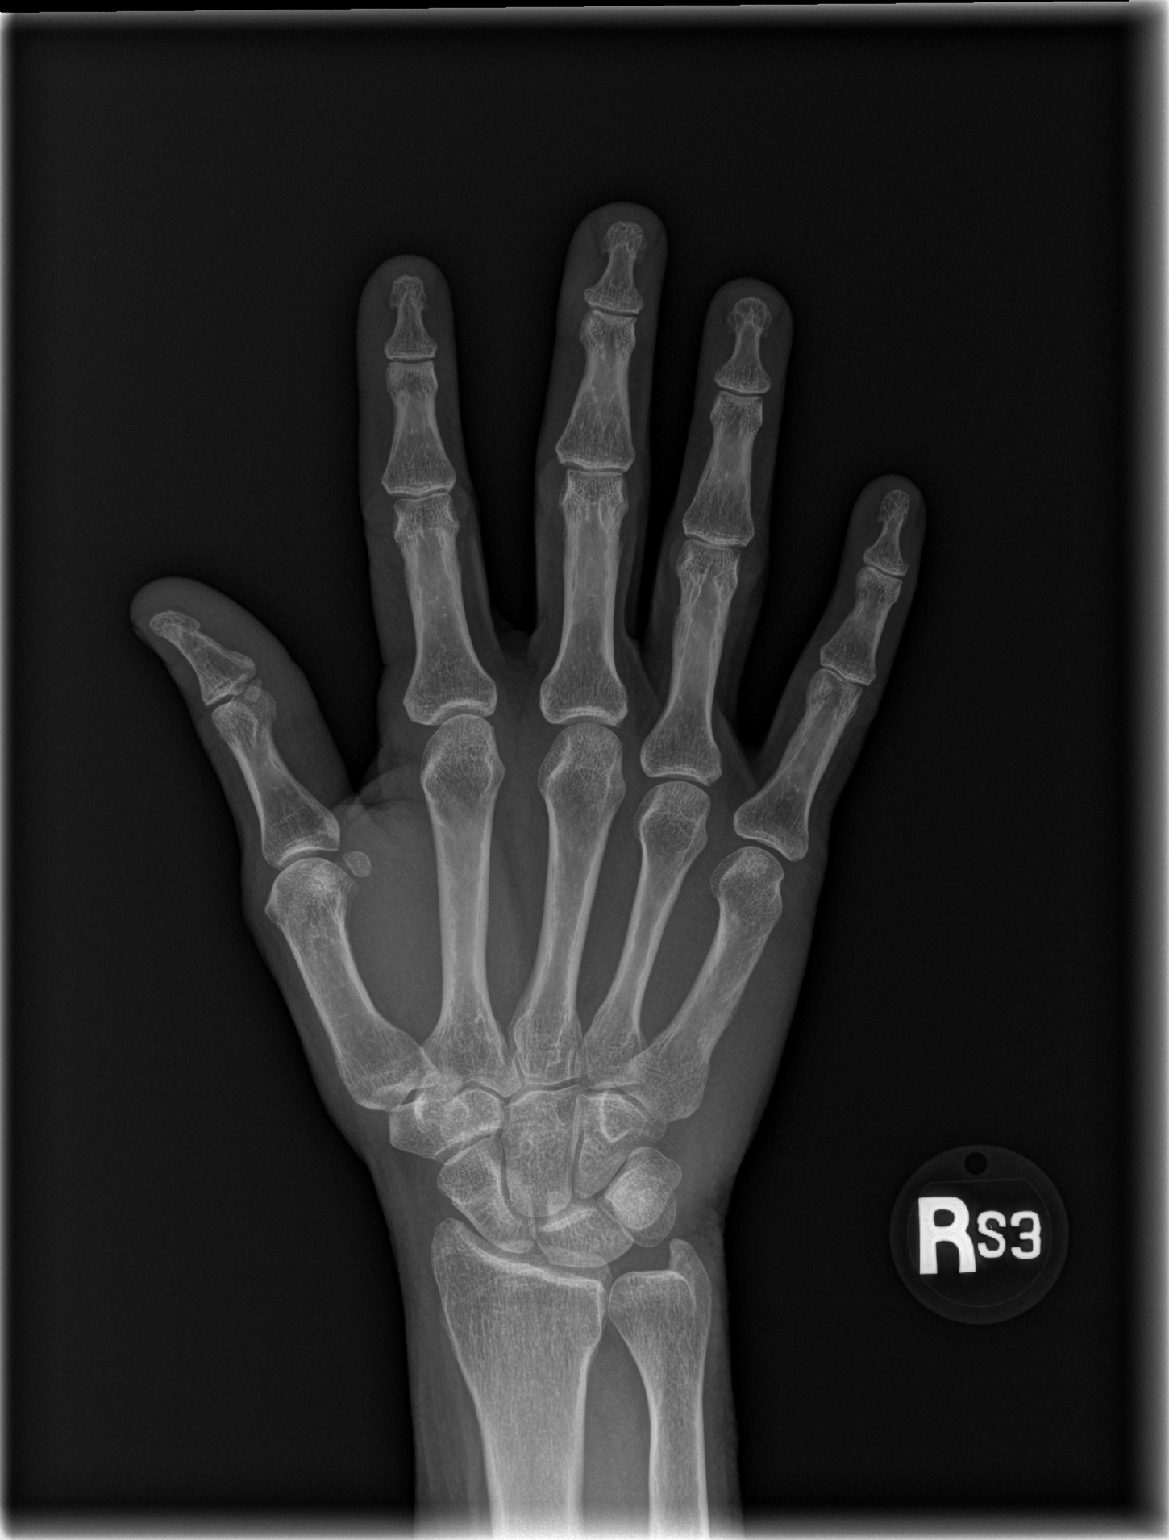

[x hand obl right]
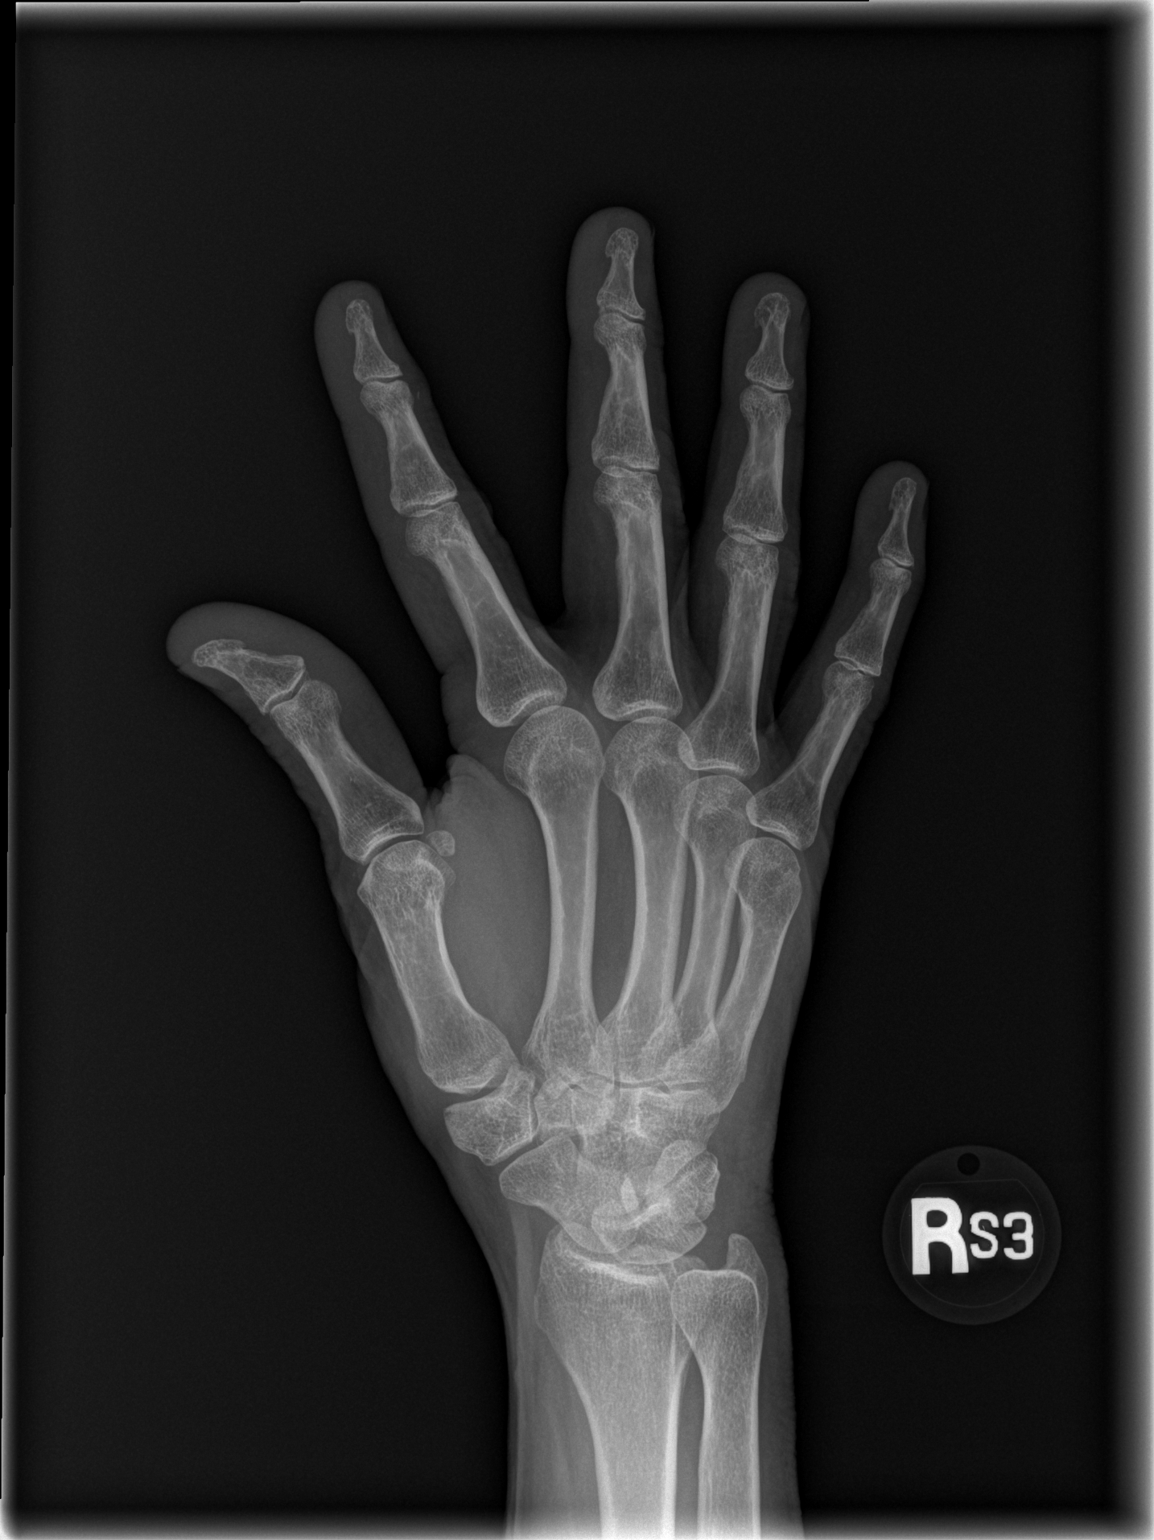

[x hand lat right]
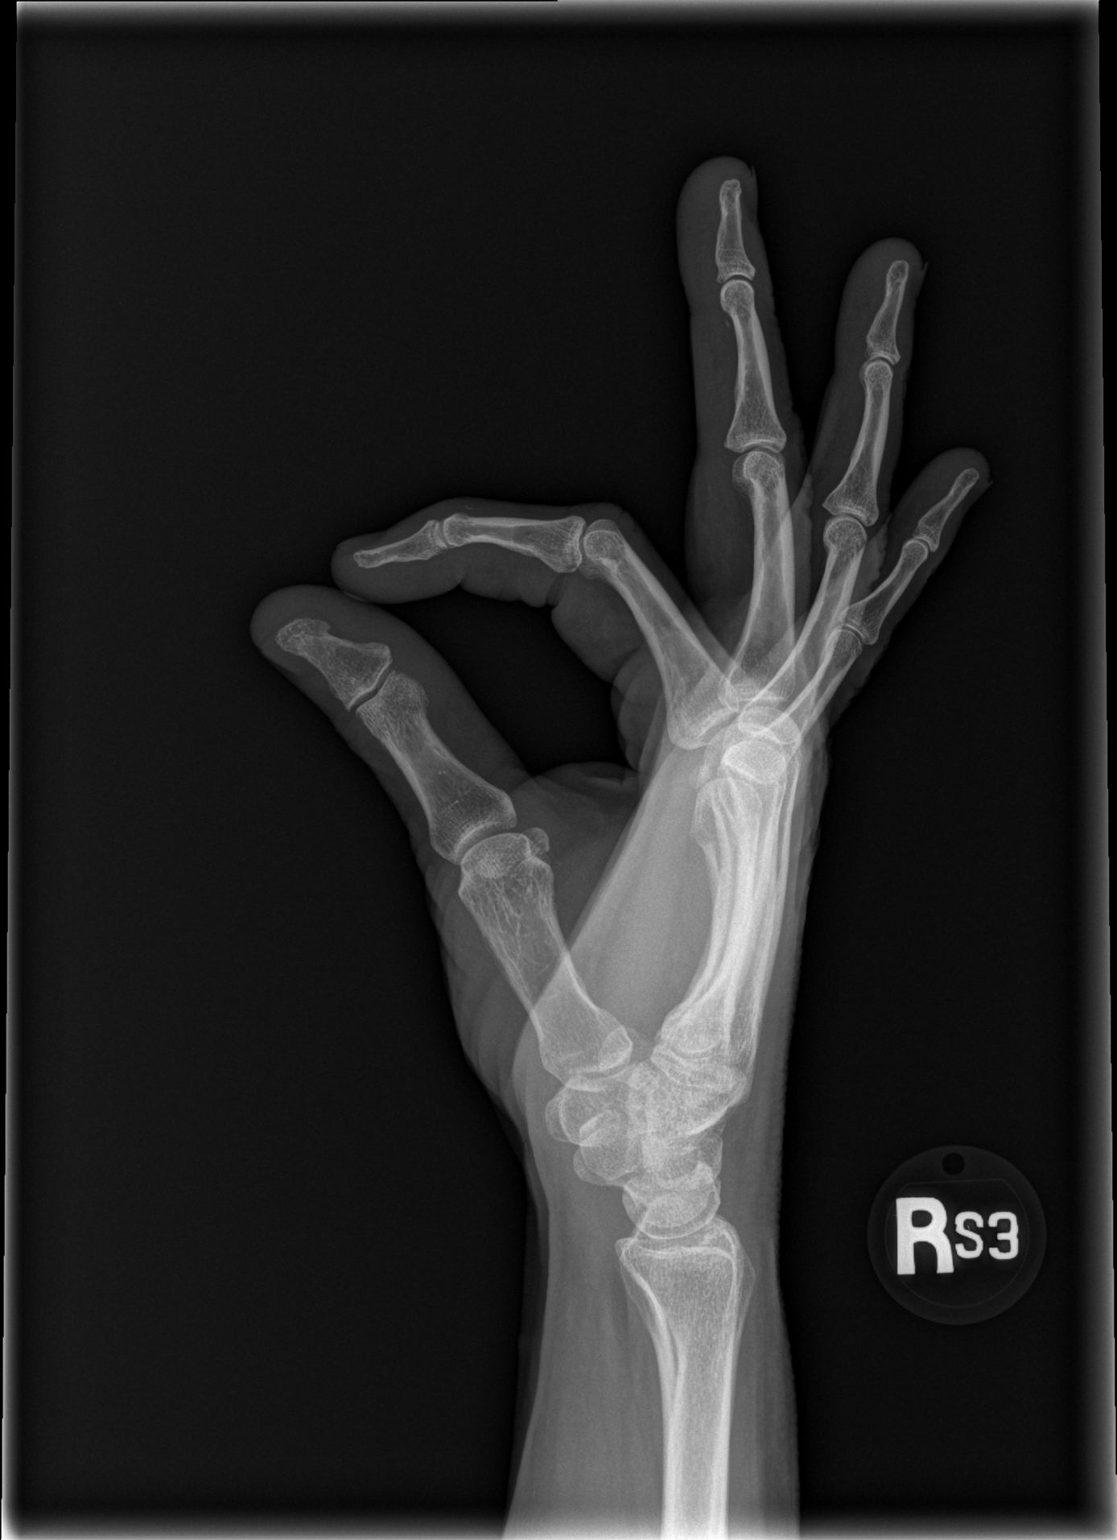

[3 of 3 positions shown; findings below may reference images not displayed]

FINDINGS: No acute fracture or dislocation is identified.  No gross
soft tissue abnormality is seen.
IMPRESSION: No acute abnormality is noted.

## 2012-02-26 IMAGING — CR DG THORACIC SPINE 2V
2 series · 2 of 2 positions shown · non-contrast
Comparison: 03/25/2011 earlier.

CLINICAL DATA: 45-year-old female status post fall with pain.

THORACIC SPINE - 2 VIEW

[t thoracic spine ap]
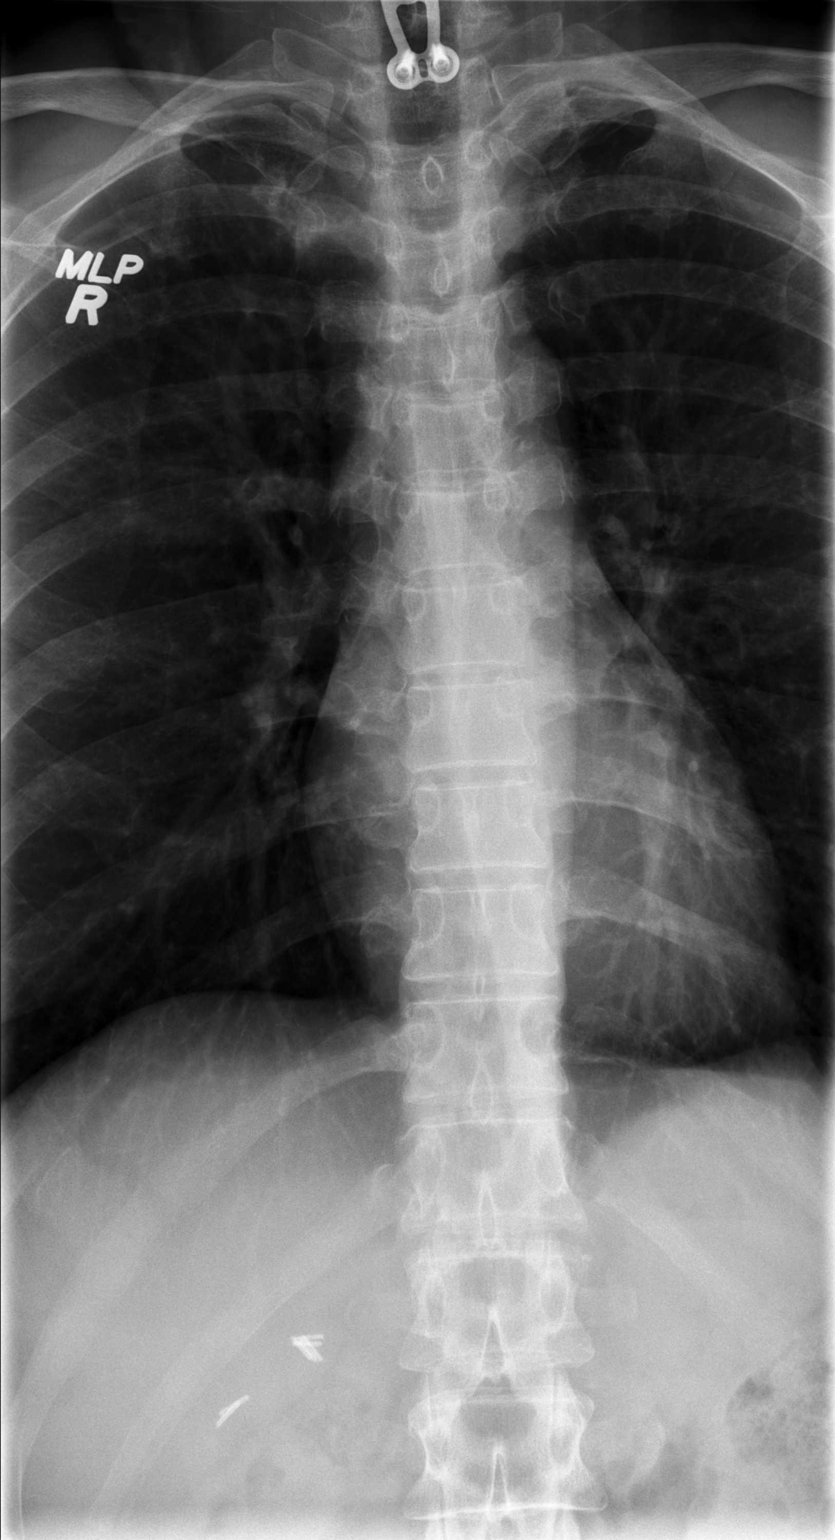

[t thoracic spine lat]
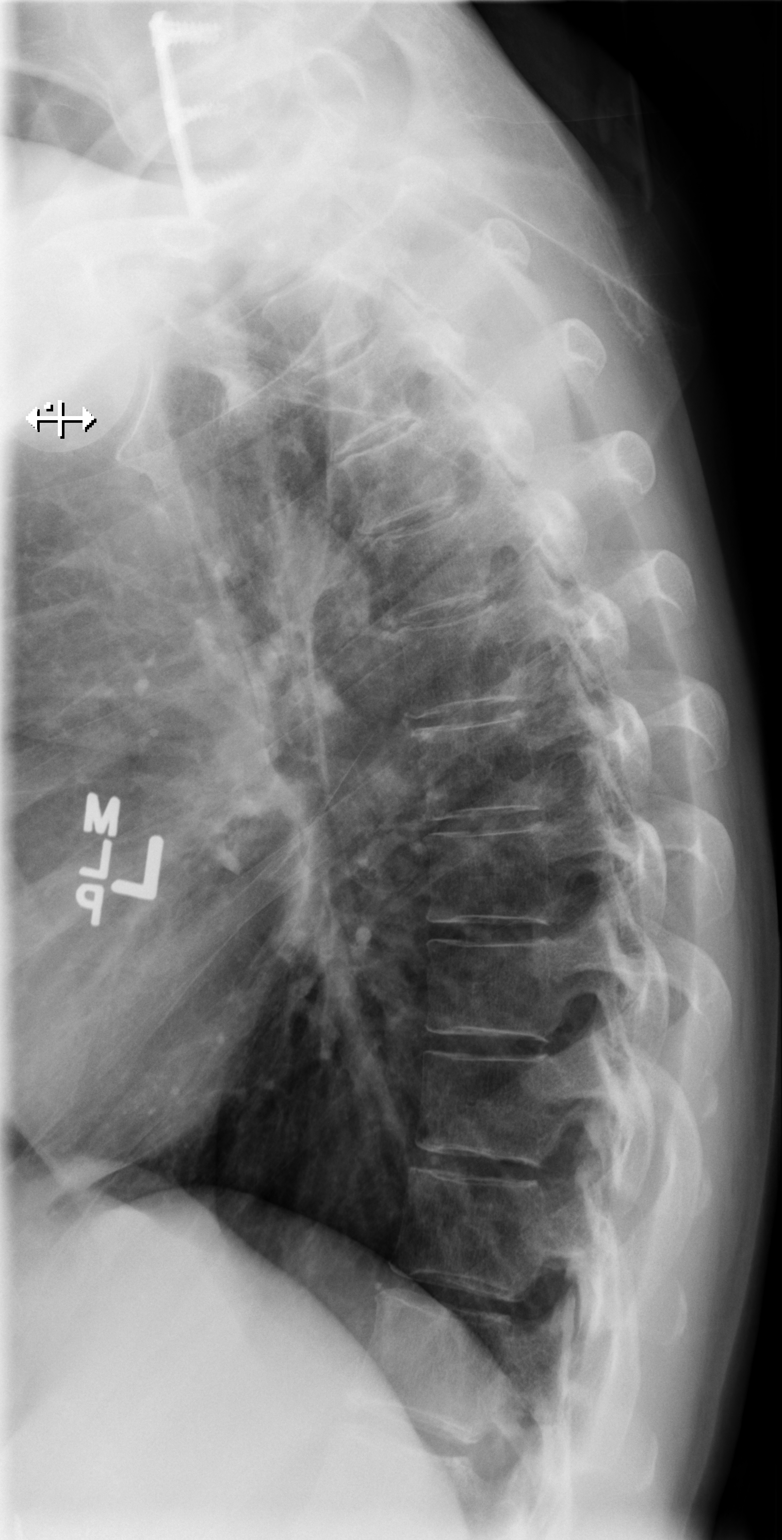

[2 of 2 positions shown; findings below may reference images not displayed]

FINDINGS: Normal thoracic segmentation.  Cervical ACDF hardware and
right upper quadrant surgical clips again noted.  Stable, normal
thoracic vertebral height and alignment.  Relatively preserved disc
spaces. Bone mineralization is within normal limits.  Grossly
negative visualized thoracic visceral contours.  Posterior ribs
appear intact.
IMPRESSION: No acute osseous abnormality in the thoracic spine.

## 2012-02-28 ENCOUNTER — Emergency Department (HOSPITAL_COMMUNITY)
Admission: EM | Admit: 2012-02-28 | Discharge: 2012-02-28 | Disposition: A | Discharge status: Home / Self Care | Payer: BC Managed Care – PPO | Attending: Emergency Medicine | Admitting: Emergency Medicine

## 2012-02-28 ENCOUNTER — Encounter (HOSPITAL_COMMUNITY): Payer: Self-pay | Admitting: *Deleted

## 2012-02-28 ENCOUNTER — Emergency Department (HOSPITAL_COMMUNITY): Payer: BC Managed Care – PPO

## 2012-02-28 DIAGNOSIS — I509 Heart failure, unspecified: Secondary | ICD-10-CM | POA: Insufficient documentation

## 2012-02-28 DIAGNOSIS — E785 Hyperlipidemia, unspecified: Secondary | ICD-10-CM | POA: Insufficient documentation

## 2012-02-28 DIAGNOSIS — I10 Essential (primary) hypertension: Secondary | ICD-10-CM | POA: Insufficient documentation

## 2012-02-28 DIAGNOSIS — E2749 Other adrenocortical insufficiency: Secondary | ICD-10-CM | POA: Insufficient documentation

## 2012-02-28 DIAGNOSIS — M542 Cervicalgia: Secondary | ICD-10-CM | POA: Insufficient documentation

## 2012-02-28 DIAGNOSIS — R209 Unspecified disturbances of skin sensation: Secondary | ICD-10-CM | POA: Insufficient documentation

## 2012-02-28 DIAGNOSIS — E039 Hypothyroidism, unspecified: Secondary | ICD-10-CM | POA: Insufficient documentation

## 2012-02-28 DIAGNOSIS — Z79899 Other long term (current) drug therapy: Secondary | ICD-10-CM | POA: Insufficient documentation

## 2012-02-28 DIAGNOSIS — F17200 Nicotine dependence, unspecified, uncomplicated: Secondary | ICD-10-CM | POA: Insufficient documentation

## 2012-02-28 DIAGNOSIS — I2510 Atherosclerotic heart disease of native coronary artery without angina pectoris: Secondary | ICD-10-CM | POA: Insufficient documentation

## 2012-02-28 DIAGNOSIS — M62838 Other muscle spasm: Secondary | ICD-10-CM | POA: Insufficient documentation

## 2012-02-28 DIAGNOSIS — M5412 Radiculopathy, cervical region: Secondary | ICD-10-CM

## 2012-02-28 HISTORY — DX: Gastroparesis: K31.84

## 2012-02-28 MED ORDER — DIAZEPAM 5 MG PO TABS: 5.0000 mg | ORAL_TABLET | Freq: Four times a day (QID) | ORAL | Status: DC | PRN

## 2012-02-28 MED ORDER — DIAZEPAM 5 MG/ML IJ SOLN
5.0000 mg | Freq: Once | INTRAMUSCULAR | Status: AC
  Administered 2012-02-28: 5 mg via INTRAMUSCULAR
  Filled 2012-02-28: qty 2

## 2012-02-28 MED ORDER — OXYCODONE-ACETAMINOPHEN 5-325 MG PO TABS: 2.0000 | ORAL_TABLET | ORAL | Status: DC | PRN

## 2012-02-28 MED ORDER — HYDROMORPHONE HCL PF 2 MG/ML IJ SOLN
2.0000 mg | Freq: Once | INTRAMUSCULAR | Status: AC
  Administered 2012-02-28: 2 mg via INTRAMUSCULAR
  Filled 2012-02-28: qty 1

## 2012-02-28 NOTE — ED Notes (Signed)
Pt presents to ED with c/o neck pain/stiffness that radiates down upper back as well as down right arm. Pt has hx of chronic back pain and takes oxycodone at home for pain control. Pt states her chronic back pain is typically in lower back and these symptoms are new. Pt is not getting any pain relief from her home meds.

## 2012-02-28 NOTE — ED Notes (Addendum)
Pt ambulatory to triage room with steady gait. Pt c/o of chronic upper back pain radiating to her right side, and right arm. Pt has limited range of motion to the right arm. Pt denies any recent falls or injury to right arm. Pt also c/o headache to the posterior side of head. Pt appears to be in no apparent distress. Skin is pink/warm/dry

## 2012-02-28 NOTE — Discharge Instructions (Signed)
You were seen and evaluated for your neck pains and right arm pain. Your CAT scan today do not show any concerning changes in your spine or neck. At this time your providers feel your symptoms are caused from muscle spasm and inflammation. You have been given medication to help with your symptoms. Please call Dr. Newell Coral for close followup of your symptoms and for further evaluation and treatment.   Cervical Radiculopathy Cervical radiculopathy happens when a nerve in the neck is pinched or bruised by a slipped (herniated) disk or by arthritic changes in the bones of the cervical spine. This can occur due to an injury or as part of the normal aging process. Pressure on the cervical nerves can cause pain or numbness that runs from your neck all the way down into your arm and fingers. CAUSES  There are many possible causes, including:  Injury.   Muscle tightness in the neck from overuse.   Swollen, painful joints (arthritis).   Breakdown or degeneration in the bones and joints of the spine (spondylosis) due to aging.   Bone spurs that may develop near the cervical nerves.  SYMPTOMS  Symptoms include pain, weakness, or numbness in the affected arm and hand. Pain can be severe or irritating. Symptoms may be worse when extending or turning the neck. DIAGNOSIS  Your caregiver will ask about your symptoms and do a physical exam. He or she may test your strength and reflexes. X-rays, CT scans, and MRI scans may be needed in cases of injury or if the symptoms do not go away after a period of time. Electromyography (EMG) or nerve conduction testing may be done to study how your nerves and muscles are working. TREATMENT  Your caregiver may recommend certain exercises to help relieve your symptoms. Cervical radiculopathy can, and often does, get better with time and treatment. If your problems continue, treatment options may include:  Wearing a soft collar for short periods of time.   Physical therapy  to strengthen the neck muscles.   Medicines, such as nonsteroidal anti-inflammatory drugs (NSAIDs), oral corticosteroids, or spinal injections.   Surgery. Different types of surgery may be done depending on the cause of your problems.  HOME CARE INSTRUCTIONS   Put ice on the affected area.   Put ice in a plastic bag.   Place a towel between your skin and the bag.   Leave the ice on for 15 to 20 minutes, 3 to 4 times a day or as directed by your caregiver.   Use a flat pillow when you sleep.   Only take over-the-counter or prescription medicines for pain, discomfort, or fever as directed by your caregiver.   If physical therapy was prescribed, follow your caregiver's directions.   If a soft collar was prescribed, use it as directed.  SEEK IMMEDIATE MEDICAL CARE IF:   Your pain gets much worse and cannot be controlled with medicines.   You have weakness or numbness in your hand, arm, face, or leg.   You have a high fever or a stiff, rigid neck.   You lose bowel or bladder control (incontinence).   You have trouble with walking, balance, or speaking.  MAKE SURE YOU:   Understand these instructions.   Will watch your condition.   Will get help right away if you are not doing well or get worse.  Document Released: 05/18/2001 Document Revised: 08/12/2011 Document Reviewed: 04/06/2011 [facility] Patient Information 2012 [place], [place].    RESOURCE GUIDE  Chronic Pain Problems:  Contact [facility]  [phone] Patients need to be referred by their primary care doctor.  Insufficient Money for Medicine: Contact United Way:  call "211" or [facility] 2088770721.  No Primary Care Doctor: - Call [facility]  [phone] - can help you locate a primary care doctor that  accepts your insurance, provides certain services, etc. - Physician Referral Service- [phone]  Agencies that provide inexpensive medical care: - [facility]   [phone] - [facility]  [phone] - [facility]  [phone] - Women's Clinic  [phone] - [facility]  [phone] [facility]  [phone]  Medicaid-accepting [facility] Providers: - [facility]- [address]  [phone], Mon-Fri 9am-7pm, Sat 9am-1pm - [facility]- [address] [place], [address] [place]  [phone] - [facility]- [address] [place]  [phone] [facility]- [address]  [phone] - Renaye Rakers- [address] [place], [address], [phone]  Only accepts [place] patients after they have their name  applied to their card  Self Pay (no insurance) in [place]: - Sickle Cell Patients: Dr Willey Blade, [facility]  [address] [place], [phone] - [facility]- [address] [place]  [phone]       [facility] [place]- [address]       -     [facility]- see information above (Speak to [employer] if you do not have insurance)       -  [facility]- [address] [place], [phone]       -  [facility]- [address],  [phone]       -  [facility]- [address] [phone]       -  Dr Julio Sicks-  [address], [place], [phone]       -  [facility]- [address], [phone]       -  [facility]- [address] [phone], also [address], [phone]       -    [facility]- [address] [place], [phone], 1st & 3rd Saturday   every month, 10am-1pm  1) Find a Doctor and Pay Out of Pocket Although you won't have to find out who is covered by your insurance plan, it is a good idea to ask around and get recommendations. You will then need to call the office and see if the doctor you have chosen will accept you as a new patient and what types of options  they offer for patients who are self-pay. Some doctors offer discounts or will set up payment plans for their patients who do not have insurance, but you will need to ask so you aren't surprised when you get to your appointment.  2) Contact Your Local Health Department Not all health departments have doctors that can see patients for sick visits, but many do, so it is worth a call to see if yours does. If you don't know where your local health department is, you can check in your phone book. The CDC also has a tool to help you locate your state's health department, and many state websites also have listings of all of their  local health departments.  3) Find a Walk-in Clinic If your illness is not likely to be very severe or complicated, you may want to try a walk in clinic. These are popping up all over the country in pharmacies, drugstores, and shopping centers. They're usually staffed by nurse practitioners or physician assistants that have been trained to treat common illnesses and complaints. They're usually fairly quick and inexpensive. However, if you have serious medical issues or chronic medical problems, these are probably not your best option  STD Testing - [facility] Department of [facility], STD Clinic, [address] [place], phone [phone] or [phone].  Monday - Friday, call for an appointment. [facility] Department of [employer], STD Clinic, [address], [place], phone [phone] or [phone].  Monday - Friday, call for an appointment.  Abuse/Neglect: [facility] Child Abuse Hotline 504-866-0750 [place] Child Abuse Hotline 734-798-0755 (After Hours)  Emergency Shelter:  [facility] 516-842-5726  Maternity Homes: - [facility] at the [place] [facility] 2535712774 - [facility] [phone]  MRSA Hotline #:   850-320-0977  [facility] Resources  [facility]  [place]  [employer] [facility] Dept. [address]                 [address]         [address] [place] [address]  Blondell Reveal Phone:  [phone]                                  Phone:  [phone]                   Phone:  [phone]  [facility], [phone] - [phone]       -     [facility] in [place], [address]                                  [phone], [facility] Child Abuse Hotline 534-439-9476 or [phone] (After Hours)   Behavioral Health Services  Substance Abuse Resources: - [facility] and Drug Services  [phone] - [facility] 612-028-1440 - [facility] [place] [phone] Floydene Flock [phone] - Residential & Outpatient Substance Abuse Program  [phone]  Psychological Services: [facility]  [phone] Services  [phone] - [facility], [phone] [place]. [address], [place], ACCESS LINE: [phone] or [phone], [employer]  Dental Assistance  If unable to pay or uninsured, contact:  [facility] or [facility]. to become qualified for the adult dental clinic.  Patients with Medicaid: [facility] [phone] [address], [phone] [address] [phone]  If unable to pay, or uninsured, contact [facility] (  [phone]) or [facility] Department [phone] in [place], [phone] in [facility]) to become qualified for the adult dental clinic  Other Low-Cost Community Dental Services: - [facility]- [address] [place], [place], [place], [address], [phone], [address]. [phone], 2nd and 4th Thursday of the month at 6:30am.  10 clients each day by appointment, can sometimes see  walk-in patients if someone does not show for an appointment. [facility]- [address] [place] [address] [place], [place], [address], [phone] - [facility]- [address] [place], [place], [address], [phone] - [place] [facility]- [phone] [facility] Health Department- [phone] [facility] Department- [phone]

## 2012-02-28 NOTE — ED Provider Notes (Signed)
History     CSN: [id number]  Arrival date & time 02/28/12  0106   First MD Initiated Contact with Patient 02/28/12 0125      Chief Complaint  Patient presents with  . Back Pain   HPI  History provided by the patient. Patient is a 46 year old female with history of hypertension, hyperlipidemia, chronic back pain status post back and neck spine surgeries who presents with complaints of worsening upper back and neck pains for the past 4 days. She denies any known injury or trauma. Pain began after waking up in the morning walking to the bathroom. Pain has become increasingly worse his primary in the right upper back and neck area. Pain radiates down right arm with some tingling and numbness. Denies any other radiation of pain. Patient has been using Flexeril and oxycodone from previous prescription and is without significant improvement of symptoms. Symptoms are described as severe. Patient denies any low back pain. Denies any urinary or fecal incontinence, urinary retention or perineal numbness. Denies any weakness or numbness in lower extremities. Denies any fever, chills, sweats, change in appetite with nausea vomiting symptoms.    Past Medical History  Diagnosis Date  . Cardiomyopathy     resolved  . Chest pain     chronicc  . Hyperlipidemia   . HTN (hypertension)   . Hypothyroidism   . Adrenal insufficiency   . Anxiety   . Nondiabetic gastroparesis   . CAD (coronary artery disease)     Cath 2008 EF normal. RCA 50-60, Septal 50%. Myoview 3/12: EF 53% normal perfusion  . Chronic back pain   . D (diarrhea)   . History of CHF (congestive heart failure)   . QT prolongation   . Addison disease   . Mitral valve prolapse   . Gastroparesis     Past Surgical History  Procedure Date  . Cardiac catheterization 03/2007    showed 60% lesion in the right coronary artery  . Cholecystectomy   . Spine surgery   . Vesicovaginal fistula closure w/ tah   . Varicose vein surgery   .  Abdominal hysterectomy     Family History  Problem Relation Age of Onset  . Coronary artery disease    . Heart attack Mother   . Cancer Father     Colon    History  Substance Use Topics  . Smoking status: Current Everyday Smoker -- 0.5 packs/day    Types: Cigarettes  . Smokeless tobacco: Not on file   Comment: smokes 6-7 cigarettes per day  . Alcohol Use: No    OB History    Grav Para Term Preterm Abortions TAB SAB Ect Mult Living                  Review of Systems  Constitutional: Negative for fever and chills.  HENT: Positive for neck pain and neck stiffness.   Gastrointestinal: Negative for nausea, vomiting and abdominal pain.  Musculoskeletal: Positive for back pain.  Skin: Negative for rash.  Neurological: Positive for weakness and numbness. Negative for dizziness, light-headedness and headaches.    Allergies  Bee venom; Penicillins; Cephalexin; Doxycycline; Dust mite extract; Erythromycin; Morphine and related; Sulfonamide derivatives; and Tramadol  Home Medications   Current Outpatient Rx  Name Route Sig Dispense Refill  . ASPIRIN 81 MG PO CHEW Oral Chew 81 mg by mouth daily.    . CYCLOBENZAPRINE HCL 10 MG PO TABS Oral Take 10 mg by mouth 3 (three) times daily as needed.  For muscle spasms    . ESCITALOPRAM OXALATE 10 MG PO TABS Oral Take 10 mg by mouth every morning.    Marland Kitchen HYDROCORTISONE 10 MG PO TABS Oral Take 10-20 mg by mouth 2 (two) times daily. Take 2 tablets in the morning and 1 tablets at night     . LEVOTHYROXINE SODIUM 100 MCG PO TABS Oral Take 100 mcg by mouth daily.     Marland Kitchen LORAZEPAM 1 MG PO TABS Oral Take 1 mg by mouth 4 (four) times daily as needed. For anxiety    . METOCLOPRAMIDE HCL 10 MG PO TABS Oral Take 20 mg by mouth 3 (three) times daily before meals.     Marland Kitchen ONDANSETRON HCL 4 MG PO TABS Oral Take 4 mg by mouth every 4 (four) hours as needed. For nausea    . OXYCODONE HCL 15 MG PO TABS Oral Take 30 mg by mouth every 6 (six) hours as needed. For  pain    . PANTOPRAZOLE SODIUM 40 MG PO TBEC Oral Take 40 mg by mouth daily as needed. For acid reflux    . ZOLPIDEM TARTRATE 10 MG PO TABS Oral Take 10 mg by mouth at bedtime.    Marland Kitchen PROMETHAZINE HCL 25 MG PO TABS Oral Take 1 tablet (25 mg total) by mouth every 6 (six) hours as needed for nausea. 15 tablet 0    BP 164/87  Pulse 64  Temp 98.3 F (36.8 C) (Oral)  Resp 16  Ht 5\' 5"  (1.651 m)  Wt 172 lb 9.6 oz (78.291 kg)  BMI 28.72 kg/m2  SpO2 97%  Physical Exam  Nursing note and vitals reviewed. Constitutional: She is oriented to person, place, and time. She appears well-developed and well-nourished. No distress.  HENT:  Head: Normocephalic and atraumatic.  Cardiovascular: Normal rate and regular rhythm.   Pulmonary/Chest: Effort normal and breath sounds normal.  Abdominal: Soft. There is no tenderness.  Musculoskeletal:       Cervical back: She exhibits tenderness.       Thoracic back: She exhibits tenderness. She exhibits no bony tenderness.       Back:  Neurological: She is alert and oriented to person, place, and time. She has normal strength. No sensory deficit.  Skin: Skin is warm and dry. No rash noted.  Psychiatric: She has a normal mood and affect. Her behavior is normal.    ED Course  Procedures   Ct Cervical Spine Wo Contrast  02/28/2012  *RADIOLOGY REPORT*  Clinical Data: Neck pain and stiffness radiating down the right shoulder and arm.  No injury.  History of fusion of C2-C4.  CT CERVICAL SPINE WITHOUT CONTRAST  Technique:  Multidetector CT imaging of the cervical spine was performed. Multiplanar CT image reconstructions were also generated.  Comparison: Plain radiographs of the cervical spine 06/17/2011. MRI cervical spine 10/26/2005.  CT cervical spine 09/17/2005.  Findings: Postoperative changes with anterior plate and screw fixation and interbody fusion from C5-C6 and C6-C7.  Residual lucency is demonstrated in the disc space and C5-C6 consistent with incomplete  fusion.  Alignment of the fused segments appear stable since the previous study and the fixation hardware appears intact. There is reversal of the usual cervical lordosis which is likely due to patient positioning but ligamentous injury is not excluded. No vertebral compression deformities.  No prevertebral soft tissue swelling.  Hypertrophic degenerative changes throughout the cervical spine most prominent at C4-5 level and C7-T1 level. Degenerative changes in the cervical facet joints.  No focal  bone lesion or bone destruction.  Osteophytes arising from the posterior elements and / or facet joints cause some effacement of the neural foramina bilaterally as C3-4, on the right at C4-5, on the right at C5-6, bilaterally but greater on the right at C6-7, and bilaterally at C7-T1.  Osteophytes or disc osteophyte complexes cause some effacement the anterior thecal sac at C3-4, C4-5, C5-6, C6-7, and C7-T1 levels.  Degenerative changes demonstrate mild progression since the previous study.  IMPRESSION: Postoperative and degenerative changes throughout the cervical spine as described.  No acute displaced fractures.  Straightening of the usual cervical lordosis can be due to patient positioning, ligamentous injury, or muscle spasm.  Original Report Authenticated By: Marlon Pel, M.D.     1. Muscle spasm   2. Cervical radicular pain       MDM  Patient seen and evaluated. Patient in no acute distress.  Patient having improvement after medications. Patient is still concerned something may have shifted in her back. Will obtain CT.  CT with no acute findings. No changes in postoperative hardware. Patient continues to have improvements at this time we have discussed continued symptomatic treatment at home and patient will plan to followup with PCP and back specialist.      Angus Seller, PA 02/28/12 2007

## 2012-02-29 ENCOUNTER — Emergency Department (HOSPITAL_COMMUNITY)
Admission: EM | Admit: 2012-02-29 | Discharge: 2012-02-29 | Disposition: A | Discharge status: Home / Self Care | Payer: BC Managed Care – PPO | Attending: Emergency Medicine | Admitting: Emergency Medicine

## 2012-02-29 ENCOUNTER — Encounter (HOSPITAL_COMMUNITY): Payer: Self-pay | Admitting: Emergency Medicine

## 2012-02-29 ENCOUNTER — Emergency Department (HOSPITAL_COMMUNITY): Payer: BC Managed Care – PPO

## 2012-02-29 DIAGNOSIS — I10 Essential (primary) hypertension: Secondary | ICD-10-CM | POA: Insufficient documentation

## 2012-02-29 DIAGNOSIS — R05 Cough: Secondary | ICD-10-CM | POA: Insufficient documentation

## 2012-02-29 DIAGNOSIS — E785 Hyperlipidemia, unspecified: Secondary | ICD-10-CM | POA: Insufficient documentation

## 2012-02-29 DIAGNOSIS — M25519 Pain in unspecified shoulder: Secondary | ICD-10-CM | POA: Insufficient documentation

## 2012-02-29 DIAGNOSIS — Z79899 Other long term (current) drug therapy: Secondary | ICD-10-CM | POA: Insufficient documentation

## 2012-02-29 DIAGNOSIS — I059 Rheumatic mitral valve disease, unspecified: Secondary | ICD-10-CM | POA: Insufficient documentation

## 2012-02-29 DIAGNOSIS — I509 Heart failure, unspecified: Secondary | ICD-10-CM | POA: Insufficient documentation

## 2012-02-29 DIAGNOSIS — R079 Chest pain, unspecified: Secondary | ICD-10-CM | POA: Insufficient documentation

## 2012-02-29 DIAGNOSIS — R059 Cough, unspecified: Secondary | ICD-10-CM | POA: Insufficient documentation

## 2012-02-29 DIAGNOSIS — I428 Other cardiomyopathies: Secondary | ICD-10-CM | POA: Insufficient documentation

## 2012-02-29 DIAGNOSIS — I2510 Atherosclerotic heart disease of native coronary artery without angina pectoris: Secondary | ICD-10-CM | POA: Insufficient documentation

## 2012-02-29 DIAGNOSIS — E2749 Other adrenocortical insufficiency: Secondary | ICD-10-CM | POA: Insufficient documentation

## 2012-02-29 DIAGNOSIS — M542 Cervicalgia: Secondary | ICD-10-CM | POA: Insufficient documentation

## 2012-02-29 DIAGNOSIS — F17200 Nicotine dependence, unspecified, uncomplicated: Secondary | ICD-10-CM | POA: Insufficient documentation

## 2012-02-29 DIAGNOSIS — K3184 Gastroparesis: Secondary | ICD-10-CM | POA: Insufficient documentation

## 2012-02-29 DIAGNOSIS — M549 Dorsalgia, unspecified: Secondary | ICD-10-CM | POA: Insufficient documentation

## 2012-02-29 DIAGNOSIS — M546 Pain in thoracic spine: Secondary | ICD-10-CM | POA: Insufficient documentation

## 2012-02-29 MED ORDER — HYDROMORPHONE HCL PF 2 MG/ML IJ SOLN
2.0000 mg | Freq: Once | INTRAMUSCULAR | Status: AC
  Administered 2012-02-29: 2 mg via INTRAVENOUS
  Filled 2012-02-29: qty 1

## 2012-02-29 NOTE — ED Provider Notes (Signed)
History     CSN: [id number]  Arrival date & time 02/29/12  0029   First MD Initiated Contact with Patient 02/29/12 [phone]      Chief Complaint  Patient presents with  . Shoulder Pain  . Back Pain     The history is provided by the patient.   the patient reports ongoing upper back pain and right shoulder pain that has been persistent for approximately one week.  She has a long-standing history of chronic back pain and is on oxycodone for this.  She denies weakness of her upper lower extremities.  She has no other complaints.  She denies fevers or chills.  She has no prior history of IV drug abuse.  She's had no recent surgery traumas or falls.. in the emergency department last night and felt much better after pain medicine but reports that the pain medicine wore off her pain returned and was uncontrolled by her home Roxicodone.  She has a followup appointment with her primary care physician on July 1 but reports she cannot wait until then. She has no urinary or fecal complaints  Past Medical History  Diagnosis Date  . Cardiomyopathy     resolved  . Chest pain     chronicc  . Hyperlipidemia   . HTN (hypertension)   . Hypothyroidism   . Adrenal insufficiency   . Anxiety   . Nondiabetic gastroparesis   . CAD (coronary artery disease)     Cath 2008 EF normal. RCA 50-60, Septal 50%. Myoview 3/12: EF 53% normal perfusion  . Chronic back pain   . D (diarrhea)   . History of CHF (congestive heart failure)   . QT prolongation   . Addison disease   . Mitral valve prolapse   . Gastroparesis     Past Surgical History  Procedure Date  . Cardiac catheterization 03/2007    showed 60% lesion in the right coronary artery  . Cholecystectomy   . Spine surgery   . Vesicovaginal fistula closure w/ tah   . Varicose vein surgery   . Abdominal hysterectomy     Family History  Problem Relation Age of Onset  . Coronary artery disease    . Heart attack Mother   . Cancer Father     Colon     History  Substance Use Topics  . Smoking status: Current Everyday Smoker -- 0.5 packs/day    Types: Cigarettes  . Smokeless tobacco: Not on file   Comment: smokes 6-7 cigarettes per day  . Alcohol Use: No    OB History    Grav Para Term Preterm Abortions TAB SAB Ect Mult Living                  Review of Systems  Musculoskeletal: Positive for back pain.  All other systems reviewed and are negative.    Allergies  Bee venom; Penicillins; Cephalexin; Doxycycline; Dust mite extract; Erythromycin; Morphine and related; Sulfonamide derivatives; and Tramadol  Home Medications   Current Outpatient Rx  Name Route Sig Dispense Refill  . ASPIRIN 81 MG PO CHEW Oral Chew 81 mg by mouth daily.    . CYCLOBENZAPRINE HCL 10 MG PO TABS Oral Take 10 mg by mouth 3 (three) times daily as needed. For muscle spasms    . DIAZEPAM 5 MG PO TABS Oral Take 1 tablet (5 mg total) by mouth every 6 (six) hours as needed for anxiety (spasms). 10 tablet 0  . ESCITALOPRAM OXALATE 10 MG PO TABS  Oral Take 10 mg by mouth every morning.    Marland Kitchen HYDROCORTISONE 10 MG PO TABS Oral Take 10-20 mg by mouth 2 (two) times daily. Take 2 tablets in the morning and 1 tablets at night     . LEVOTHYROXINE SODIUM 100 MCG PO TABS Oral Take 100 mcg by mouth daily.     Marland Kitchen LORAZEPAM 1 MG PO TABS Oral Take 1 mg by mouth 4 (four) times daily as needed. For anxiety    . METOCLOPRAMIDE HCL 10 MG PO TABS Oral Take 20 mg by mouth 3 (three) times daily before meals.     Marland Kitchen ONDANSETRON HCL 4 MG PO TABS Oral Take 4 mg by mouth every 4 (four) hours as needed. For nausea    . OXYCODONE HCL 15 MG PO TABS Oral Take 30 mg by mouth every 6 (six) hours as needed. For pain    . OXYCODONE-ACETAMINOPHEN 5-325 MG PO TABS Oral Take 2 tablets by mouth every 4 (four) hours as needed for pain. 10 tablet 0  . PANTOPRAZOLE SODIUM 40 MG PO TBEC Oral Take 40 mg by mouth daily as needed. For acid reflux    . ZOLPIDEM TARTRATE 10 MG PO TABS Oral Take 10 mg by  mouth at bedtime.    Marland Kitchen PROMETHAZINE HCL 25 MG PO TABS Oral Take 1 tablet (25 mg total) by mouth every 6 (six) hours as needed for nausea. 15 tablet 0    BP 157/87  Pulse 56  Temp 98.3 F (36.8 C) (Oral)  Resp 24  SpO2 96%  Physical Exam  Nursing note and vitals reviewed. Constitutional: She is oriented to person, place, and time. She appears well-developed and well-nourished. No distress.  HENT:  Head: Normocephalic and atraumatic.  Eyes: EOM are normal.  Neck: Normal range of motion.  Cardiovascular: Normal rate, regular rhythm and normal heart sounds.   Pulmonary/Chest: Effort normal and breath sounds normal.  Abdominal: Soft. She exhibits no distension. There is no tenderness.  Musculoskeletal: Normal range of motion.  Neurological: She is alert and oriented to person, place, and time.  Skin: Skin is warm and dry.  Psychiatric: She has a normal mood and affect. Judgment normal.    ED Course  Procedures (including critical care time)  Labs Reviewed - No data to display Dg Chest 2 View  02/29/2012  *RADIOLOGY REPORT*  Clinical Data: Right chest pain, cough  CHEST - 2 VIEW  Comparison: 12/17/2011  Findings: Lungs are clear. No pleural effusion or pneumothorax.  Cardiomediastinal silhouette is within normal limits.  Lower cervical spine fixation hardware.  Cholecystectomy clips.  IMPRESSION: Normal chest radiographs.  Original Report Authenticated By: Charline Bills, M.D.   Dg Thoracic Spine 2 View  02/29/2012  *RADIOLOGY REPORT*  Clinical Data: Right upper chest/back pain  THORACIC SPINE - 2 VIEW  Comparison: None.  Findings: Normal thoracic kyphosis.  No evidence of fracture or dislocation.  Vertebral body heights are maintained.  Lower cervical spine fixation hardware.  Cholecystectomy clips.  Visualized lungs are essentially clear.  IMPRESSION: No evidence of fracture or dislocation.  Original Report Authenticated By: Charline Bills, M.D.   Ct Cervical Spine Wo  Contrast  02/28/2012  *RADIOLOGY REPORT*  Clinical Data: Neck pain and stiffness radiating down the right shoulder and arm.  No injury.  History of fusion of C2-C4.  CT CERVICAL SPINE WITHOUT CONTRAST  Technique:  Multidetector CT imaging of the cervical spine was performed. Multiplanar CT image reconstructions were also generated.  Comparison: Plain radiographs  of the cervical spine 06/17/2011. MRI cervical spine 10/26/2005.  CT cervical spine 09/17/2005.  Findings: Postoperative changes with anterior plate and screw fixation and interbody fusion from C5-C6 and C6-C7.  Residual lucency is demonstrated in the disc space and C5-C6 consistent with incomplete fusion.  Alignment of the fused segments appear stable since the previous study and the fixation hardware appears intact. There is reversal of the usual cervical lordosis which is likely due to patient positioning but ligamentous injury is not excluded. No vertebral compression deformities.  No prevertebral soft tissue swelling.  Hypertrophic degenerative changes throughout the cervical spine most prominent at C4-5 level and C7-T1 level. Degenerative changes in the cervical facet joints.  No focal bone lesion or bone destruction.  Osteophytes arising from the posterior elements and / or facet joints cause some effacement of the neural foramina bilaterally as C3-4, on the right at C4-5, on the right at C5-6, bilaterally but greater on the right at C6-7, and bilaterally at C7-T1.  Osteophytes or disc osteophyte complexes cause some effacement the anterior thecal sac at C3-4, C4-5, C5-6, C6-7, and C7-T1 levels.  Degenerative changes demonstrate mild progression since the previous study.  IMPRESSION: Postoperative and degenerative changes throughout the cervical spine as described.  No acute displaced fractures.  Straightening of the usual cervical lordosis can be due to patient positioning, ligamentous injury, or muscle spasm.  Original Report Authenticated By:  Marlon Pel, M.D.    I personally reviewed the imaging tests through PACS system   1. Thoracic back pain       MDM  The patient's pain is significantly improved at this time.  Her plain films are normal.  She may require MRI as an outpatient but this does not need to be completed today.  She's been instructed to followup with her primary care physician at a sooner time.  Chest is returned emergency department for new worsening symptoms        Lyanne Co, MD 02/29/12 [phone]

## 2012-02-29 NOTE — ED Notes (Signed)
Pt waiting for her daughter to pick her up. Received Dilaudid 2mg , IV. Resting comfortably ow.

## 2012-02-29 NOTE — ED Notes (Signed)
Patient transported to X-ray 

## 2012-02-29 NOTE — Discharge Instructions (Signed)
Back Pain, Adult Low back pain is very common. About 1 in 5 people have back pain.The cause of low back pain is rarely dangerous. The pain often gets better over time.About half of people with a sudden onset of back pain feel better in just 2 weeks. About 8 in 10 people feel better by 6 weeks.  CAUSES Some common causes of back pain include:  Strain of the muscles or ligaments supporting the spine.   Wear and tear (degeneration) of the spinal discs.   Arthritis.   Direct injury to the back.  DIAGNOSIS Most of the time, the direct cause of low back pain is not known.However, back pain can be treated effectively even when the exact cause of the pain is unknown.Answering your caregiver's questions about your overall health and symptoms is one of the most accurate ways to make sure the cause of your pain is not dangerous. If your caregiver needs more information, he or she may order lab work or imaging tests (X-rays or MRIs).However, even if imaging tests show changes in your back, this usually does not require surgery. HOME CARE INSTRUCTIONS For many people, back pain returns.Since low back pain is rarely dangerous, it is often a condition that people can learn to manageon their own.   Remain active. It is stressful on the back to sit or stand in one place. Do not sit, drive, or stand in one place for more than 30 minutes at a time. Take short walks on level surfaces as soon as pain allows.Try to increase the length of time you walk each day.   Do not stay in bed.Resting more than 1 or 2 days can delay your recovery.   Do not avoid exercise or work.Your body is made to move.It is not dangerous to be active, even though your back may hurt.Your back will likely heal faster if you return to being active before your pain is gone.   Pay attention to your body when you bend and lift. Many people have less discomfortwhen lifting if they bend their knees, keep the load close to their  bodies,and avoid twisting. Often, the most comfortable positions are those that put less stress on your recovering back.   Find a comfortable position to sleep. Use a firm mattress and lie on your side with your knees slightly bent. If you lie on your back, put a pillow under your knees.   Only take over-the-counter or prescription medicines as directed by your caregiver. Over-the-counter medicines to reduce pain and inflammation are often the most helpful.Your caregiver may prescribe muscle relaxant drugs.These medicines help dull your pain so you can more quickly return to your normal activities and healthy exercise.   Put ice on the injured area.   Put ice in a plastic bag.   Place a towel between your skin and the bag.   Leave the ice on for 15 to 20 minutes, 3 to 4 times a day for the first 2 to 3 days. After that, ice and heat may be alternated to reduce pain and spasms.   Ask your caregiver about trying back exercises and gentle massage. This may be of some benefit.   Avoid feeling anxious or stressed.Stress increases muscle tension and can worsen back pain.It is important to recognize when you are anxious or stressed and learn ways to manage it.Exercise is a great option.  SEEK MEDICAL CARE IF:  You have pain that is not relieved with rest or medicine.   You have   pain that does not improve in 1 week.   You have new symptoms.   You are generally not feeling well.  SEEK IMMEDIATE MEDICAL CARE IF:   You have pain that radiates from your back into your legs.   You develop new bowel or bladder control problems.   You have unusual weakness or numbness in your arms or legs.   You develop nausea or vomiting.   You develop abdominal pain.   You feel faint.  Document Released: 08/23/2005 Document Revised: 08/12/2011 Document Reviewed: 01/11/2011 [employer] Patient Information 2012 [employer] 

## 2012-02-29 NOTE — ED Notes (Signed)
Pt states she is having upper back and right shoulder pain  Pt states she was seen here last night and they gave her a shot but the pain is back   Pt states she has also developed diarrhea since she was seen last night

## 2012-03-03 ENCOUNTER — Observation Stay (HOSPITAL_COMMUNITY)
Admission: EM | Admit: 2012-03-03 | Discharge: 2012-03-04 | Disposition: A | Discharge status: Home / Self Care | Payer: BC Managed Care – PPO | Source: Ambulatory Visit | Attending: Internal Medicine | Admitting: Internal Medicine

## 2012-03-03 ENCOUNTER — Encounter (HOSPITAL_COMMUNITY): Payer: Self-pay | Admitting: Emergency Medicine

## 2012-03-03 ENCOUNTER — Observation Stay (HOSPITAL_COMMUNITY): Payer: BC Managed Care – PPO

## 2012-03-03 ENCOUNTER — Emergency Department (HOSPITAL_COMMUNITY): Payer: BC Managed Care – PPO

## 2012-03-03 DIAGNOSIS — R079 Chest pain, unspecified: Secondary | ICD-10-CM

## 2012-03-03 DIAGNOSIS — E2749 Other adrenocortical insufficiency: Secondary | ICD-10-CM | POA: Insufficient documentation

## 2012-03-03 DIAGNOSIS — I10 Essential (primary) hypertension: Secondary | ICD-10-CM | POA: Diagnosis present

## 2012-03-03 DIAGNOSIS — E271 Primary adrenocortical insufficiency: Secondary | ICD-10-CM | POA: Diagnosis present

## 2012-03-03 DIAGNOSIS — R0602 Shortness of breath: Secondary | ICD-10-CM | POA: Insufficient documentation

## 2012-03-03 DIAGNOSIS — R197 Diarrhea, unspecified: Secondary | ICD-10-CM | POA: Insufficient documentation

## 2012-03-03 DIAGNOSIS — E032 Hypothyroidism due to medicaments and other exogenous substances: Secondary | ICD-10-CM

## 2012-03-03 DIAGNOSIS — E039 Hypothyroidism, unspecified: Secondary | ICD-10-CM | POA: Diagnosis present

## 2012-03-03 DIAGNOSIS — I429 Cardiomyopathy, unspecified: Secondary | ICD-10-CM | POA: Diagnosis present

## 2012-03-03 LAB — CBC
HCT: 41.5 % (ref 36.0–46.0)
HCT: 41.6 % (ref 36.0–46.0)
Hemoglobin: 14.1 g/dL (ref 12.0–15.0)
Hemoglobin: 14 g/dL (ref 12.0–15.0)
MCH: 28.6 pg (ref 26.0–34.0)
MCV: 84.2 fL (ref 78.0–100.0)
MCV: 85.6 fL (ref 78.0–100.0)
Platelets: 316 10*3/uL (ref 150–400)
RBC: 4.93 MIL/uL (ref 3.87–5.11)
RBC: 4.86 MIL/uL (ref 3.87–5.11)
WBC: 11.6 10*3/uL — ABNORMAL HIGH (ref 4.0–10.5)

## 2012-03-03 LAB — COMPREHENSIVE METABOLIC PANEL
ALT: 11 U/L (ref 0–35)
Albumin: 3.4 g/dL — ABNORMAL LOW (ref 3.5–5.2)
BUN: 17 mg/dL (ref 6–23)
CO2: 25 mEq/L (ref 19–32)
Calcium: 8.8 mg/dL (ref 8.4–10.5)
Calcium: 8.6 mg/dL (ref 8.4–10.5)
Creatinine, Ser: 0.92 mg/dL (ref 0.50–1.10)
Creatinine, Ser: 0.84 mg/dL (ref 0.50–1.10)
GFR calc Af Amer: 84 mL/min — ABNORMAL LOW (ref >90)
GFR calc non Af Amer: 72 mL/min — ABNORMAL LOW (ref >90)
Glucose, Bld: 97 mg/dL (ref 70–99)
Sodium: 141 mEq/L (ref 135–145)
Total Protein: 6.8 g/dL (ref 6.0–8.3)

## 2012-03-03 LAB — CARDIAC PANEL(CRET KIN+CKTOT+MB+TROPI)
CK, MB: 1.2 ng/mL (ref 0.3–4.0)
Relative Index: INVALID (ref 0.0–2.5)
Troponin I: <0.3 ng/mL (ref <0.30)
Troponin I: <0.3 ng/mL (ref <0.30)

## 2012-03-03 LAB — PRO B NATRIURETIC PEPTIDE: Pro B Natriuretic peptide (BNP): 76.9 pg/mL (ref 0–125)

## 2012-03-03 LAB — POCT I-STAT, CHEM 8
Calcium, Ion: 1.16 mmol/L (ref 1.12–1.32)
Glucose, Bld: 89 mg/dL (ref 70–99)
HCT: 43 % (ref 36.0–46.0)
Hemoglobin: 14.6 g/dL (ref 12.0–15.0)
Potassium: 3.5 mEq/L (ref 3.5–5.1)

## 2012-03-03 MED ORDER — LORAZEPAM 0.5 MG PO TABS
1.0000 mg | ORAL_TABLET | Freq: Four times a day (QID) | ORAL | Status: DC | PRN
  Administered 2012-03-03 – 2012-03-04 (×4): 1 mg via ORAL
  Filled 2012-03-03 (×4): qty 2

## 2012-03-03 MED ORDER — ASPIRIN EC 325 MG PO TBEC
325.0000 mg | DELAYED_RELEASE_TABLET | Freq: Every day | ORAL | Status: DC
  Filled 2012-03-03: qty 1

## 2012-03-03 MED ORDER — SODIUM CHLORIDE 0.9 % IJ SOLN
3.0000 mL | Freq: Two times a day (BID) | INTRAMUSCULAR | Status: DC
  Administered 2012-03-03 – 2012-03-04 (×2): 3 mL via INTRAVENOUS

## 2012-03-03 MED ORDER — ONDANSETRON HCL 4 MG/2ML IJ SOLN: 4.0000 mg | Freq: Four times a day (QID) | INTRAMUSCULAR | Status: DC | PRN

## 2012-03-03 MED ORDER — METOCLOPRAMIDE HCL 10 MG PO TABS
20.0000 mg | ORAL_TABLET | Freq: Three times a day (TID) | ORAL | Status: DC
  Administered 2012-03-03 – 2012-03-04 (×4): 20 mg via ORAL
  Filled 2012-03-03 (×6): qty 2

## 2012-03-03 MED ORDER — ONDANSETRON HCL 4 MG PO TABS: 4.0000 mg | ORAL_TABLET | Freq: Four times a day (QID) | ORAL | Status: DC | PRN

## 2012-03-03 MED ORDER — ESCITALOPRAM OXALATE 10 MG PO TABS
10.0000 mg | ORAL_TABLET | Freq: Every day | ORAL | Status: DC
  Administered 2012-03-03 – 2012-03-04 (×2): 10 mg via ORAL
  Filled 2012-03-03 (×2): qty 1

## 2012-03-03 MED ORDER — CYCLOBENZAPRINE HCL 10 MG PO TABS: 10.0000 mg | ORAL_TABLET | Freq: Three times a day (TID) | ORAL | Status: DC | PRN

## 2012-03-03 MED ORDER — PANTOPRAZOLE SODIUM 40 MG PO TBEC
40.0000 mg | DELAYED_RELEASE_TABLET | Freq: Every day | ORAL | Status: DC
  Administered 2012-03-03 – 2012-03-04 (×2): 40 mg via ORAL
  Filled 2012-03-03 (×2): qty 1

## 2012-03-03 MED ORDER — ZOLPIDEM TARTRATE 5 MG PO TABS
10.0000 mg | ORAL_TABLET | Freq: Every day | ORAL | Status: DC
  Administered 2012-03-03: 10 mg via ORAL
  Filled 2012-03-03: qty 2

## 2012-03-03 MED ORDER — ACETAMINOPHEN 650 MG RE SUPP: 650.0000 mg | Freq: Four times a day (QID) | RECTAL | Status: DC | PRN

## 2012-03-03 MED ORDER — FENTANYL CITRATE 0.05 MG/ML IJ SOLN
25.0000 ug | Freq: Once | INTRAMUSCULAR | Status: AC
  Administered 2012-03-03: 25 ug via INTRAVENOUS
  Filled 2012-03-03: qty 2

## 2012-03-03 MED ORDER — ONDANSETRON HCL 4 MG/2ML IJ SOLN
4.0000 mg | Freq: Once | INTRAMUSCULAR | Status: AC
  Administered 2012-03-03: 4 mg via INTRAVENOUS
  Filled 2012-03-03: qty 2

## 2012-03-03 MED ORDER — HYDROMORPHONE HCL PF 2 MG/ML IJ SOLN
2.0000 mg | INTRAMUSCULAR | Status: DC | PRN
  Administered 2012-03-03 (×2): 2 mg via INTRAVENOUS
  Filled 2012-03-03 (×2): qty 1

## 2012-03-03 MED ORDER — OXYCODONE HCL 5 MG PO TABS: 30.0000 mg | ORAL_TABLET | Freq: Four times a day (QID) | ORAL | Status: DC | PRN

## 2012-03-03 MED ORDER — ENOXAPARIN SODIUM 40 MG/0.4ML ~~LOC~~ SOLN
40.0000 mg | SUBCUTANEOUS | Status: DC
  Administered 2012-03-03: 40 mg via SUBCUTANEOUS
  Filled 2012-03-03 (×2): qty 0.4

## 2012-03-03 MED ORDER — HYDROCORTISONE 10 MG PO TABS
20.0000 mg | ORAL_TABLET | Freq: Every day | ORAL | Status: DC
  Administered 2012-03-04: 20 mg via ORAL
  Filled 2012-03-03 (×2): qty 2

## 2012-03-03 MED ORDER — OXYCODONE HCL 5 MG PO TABS
30.0000 mg | ORAL_TABLET | Freq: Four times a day (QID) | ORAL | Status: DC | PRN
  Administered 2012-03-03 – 2012-03-04 (×5): 30 mg via ORAL
  Filled 2012-03-03 (×5): qty 6

## 2012-03-03 MED ORDER — ASPIRIN 81 MG PO CHEW: 324.0000 mg | CHEWABLE_TABLET | Freq: Once | ORAL | Status: DC

## 2012-03-03 MED ORDER — ASPIRIN EC 325 MG PO TBEC
325.0000 mg | DELAYED_RELEASE_TABLET | Freq: Every day | ORAL | Status: DC
  Administered 2012-03-03 – 2012-03-04 (×2): 325 mg via ORAL
  Filled 2012-03-03 (×2): qty 1

## 2012-03-03 MED ORDER — IOHEXOL 350 MG/ML SOLN
100.0000 mL | Freq: Once | INTRAVENOUS | Status: AC | PRN
  Administered 2012-03-03: 100 mL via INTRAVENOUS

## 2012-03-03 MED ORDER — HYDROCORTISONE SOD SUCCINATE 100 MG IJ SOLR
50.0000 mg | Freq: Once | INTRAMUSCULAR | Status: AC
  Administered 2012-03-03: 50 mg via INTRAVENOUS
  Filled 2012-03-03: qty 2

## 2012-03-03 MED ORDER — SODIUM CHLORIDE 0.9 % IJ SOLN: 3.0000 mL | Freq: Two times a day (BID) | INTRAMUSCULAR | Status: DC

## 2012-03-03 MED ORDER — LEVOTHYROXINE SODIUM 100 MCG PO TABS
100.0000 ug | ORAL_TABLET | Freq: Every day | ORAL | Status: DC
  Administered 2012-03-03 – 2012-03-04 (×2): 100 ug via ORAL
  Filled 2012-03-03 (×5): qty 1

## 2012-03-03 MED ORDER — NITROGLYCERIN 0.4 MG SL SUBL
0.4000 mg | SUBLINGUAL_TABLET | SUBLINGUAL | Status: DC | PRN
  Administered 2012-03-03: 0.4 mg via SUBLINGUAL
  Filled 2012-03-03: qty 25

## 2012-03-03 MED ORDER — SODIUM CHLORIDE 0.9 % IV SOLN
INTRAVENOUS | Status: DC
  Administered 2012-03-03: 21:00:00 via INTRAVENOUS
  Administered 2012-03-03: 100 mL/h via INTRAVENOUS
  Administered 2012-03-04: 07:00:00 via INTRAVENOUS

## 2012-03-03 MED ORDER — HYDROCORTISONE 10 MG PO TABS
10.0000 mg | ORAL_TABLET | Freq: Every day | ORAL | Status: DC
  Administered 2012-03-03: 10 mg via ORAL
  Filled 2012-03-03 (×2): qty 1

## 2012-03-03 MED ORDER — ACETAMINOPHEN 325 MG PO TABS
650.0000 mg | ORAL_TABLET | Freq: Four times a day (QID) | ORAL | Status: DC | PRN
  Administered 2012-03-03: 650 mg via ORAL
  Filled 2012-03-03: qty 2

## 2012-03-03 NOTE — ED Provider Notes (Signed)
History     CSN: [id number]  Arrival date & time 03/03/12  0206   First MD Initiated Contact with Patient 03/03/12 0216     Chief complaint chest pain  (Consider location/radiation/quality/duration/timing/severity/associated sxs/prior treatment) HPI History provided by patient. Has been having ongoing neck and back pain the last few days with multiple ED visits. Tonight she developed chest pain shortness of breath. She has remote history of surgery in her neck but no recent surgeries or hospitalizations otherwise. Pain is moderate in severity. Unrelieved by medications at home. Has history of coronary artery disease and CHF. Last catheterization 2008, is followed by cardiology. No known aggravating or alleviating factors with regards to chest pain. Pain is located substernal and radiates to left arm and right arm. Past Medical History  Diagnosis Date  . Cardiomyopathy     resolved  . Chest pain     chronicc  . Hyperlipidemia   . HTN (hypertension)   . Hypothyroidism   . Adrenal insufficiency   . Anxiety   . Nondiabetic gastroparesis   . CAD (coronary artery disease)     Cath 2008 EF normal. RCA 50-60, Septal 50%. Myoview 3/12: EF 53% normal perfusion  . Chronic back pain   . D (diarrhea)   . History of CHF (congestive heart failure)   . QT prolongation   . Addison disease   . Mitral valve prolapse   . Gastroparesis     Past Surgical History  Procedure Date  . Cardiac catheterization 03/2007    showed 60% lesion in the right coronary artery  . Cholecystectomy   . Spine surgery   . Vesicovaginal fistula closure w/ tah   . Varicose vein surgery   . Abdominal hysterectomy     Family History  Problem Relation Age of Onset  . Coronary artery disease    . Heart attack Mother   . Cancer Father     Colon    History  Substance Use Topics  . Smoking status: Current Everyday Smoker -- 0.5 packs/day    Types: Cigarettes  . Smokeless tobacco: Not on file   Comment:  smokes 6-7 cigarettes per day  . Alcohol Use: No    OB History    Grav Para Term Preterm Abortions TAB SAB Ect Mult Living                  Review of Systems  Constitutional: Negative for fever and chills.  HENT: Positive for neck pain.   Eyes: Negative for pain.  Respiratory: Negative for shortness of breath.   Cardiovascular: Positive for chest pain.  Gastrointestinal: Negative for abdominal pain.  Genitourinary: Negative for dysuria.  Musculoskeletal: Positive for back pain.  Skin: Negative for rash.  Neurological: Negative for headaches.  All other systems reviewed and are negative.    Allergies  Bee venom; Penicillins; Cephalexin; Doxycycline; Dust mite extract; Erythromycin; Morphine and related; Sulfonamide derivatives; and Tramadol  Home Medications   Current Outpatient Rx  Name Route Sig Dispense Refill  . ASPIRIN 81 MG PO CHEW Oral Chew 81 mg by mouth daily.    . CYCLOBENZAPRINE HCL 10 MG PO TABS Oral Take 10 mg by mouth 3 (three) times daily as needed. For muscle spasms    . ESCITALOPRAM OXALATE 10 MG PO TABS Oral Take 10 mg by mouth every morning.    Marland Kitchen HYDROCORTISONE 10 MG PO TABS Oral Take 10-20 mg by mouth 2 (two) times daily. Take 2 tablets in the morning and 1  tablets at night     . LEVOTHYROXINE SODIUM 100 MCG PO TABS Oral Take 100 mcg by mouth daily.     Marland Kitchen LORAZEPAM 1 MG PO TABS Oral Take 1 mg by mouth 4 (four) times daily as needed. For anxiety    . METOCLOPRAMIDE HCL 10 MG PO TABS Oral Take 20 mg by mouth 3 (three) times daily before meals.     Marland Kitchen ONDANSETRON HCL 4 MG PO TABS Oral Take 4 mg by mouth every 4 (four) hours as needed. For nausea    . OXYCODONE HCL 15 MG PO TABS Oral Take 30 mg by mouth every 6 (six) hours as needed. For pain    . PANTOPRAZOLE SODIUM 40 MG PO TBEC Oral Take 40 mg by mouth daily as needed. For acid reflux    . ZOLPIDEM TARTRATE 10 MG PO TABS Oral Take 10 mg by mouth at bedtime.    Marland Kitchen PROMETHAZINE HCL 25 MG PO TABS Oral Take 1  tablet (25 mg total) by mouth every 6 (six) hours as needed for nausea. 15 tablet 0    BP 115/63  Pulse 68  Temp 98.1 F (36.7 C) (Oral)  Resp 14  SpO2 97%  Physical Exam  Constitutional: She is oriented to person, place, and time. She appears well-developed and well-nourished.  HENT:  Head: Normocephalic and atraumatic.  Eyes: Conjunctivae and EOM are normal. Pupils are equal, round, and reactive to light.  Neck: Trachea normal. No thyromegaly present.       No nuchal rigidity. No cervical deformity.  Cardiovascular: Normal rate, regular rhythm, S1 normal, S2 normal and normal pulses.     No systolic murmur is present   No diastolic murmur is present  Pulses:      Radial pulses are 2+ on the right side, and 2+ on the left side.  Pulmonary/Chest: Effort normal and breath sounds normal. She has no wheezes. She has no rhonchi. She has no rales. She exhibits no tenderness.  Abdominal: Soft. Normal appearance and bowel sounds are normal. There is no tenderness. There is no CVA tenderness and negative Murphy's sign.  Musculoskeletal:       Localizes pain to upper right sided thoracic region without rash or lesion. BLE:s Calves nontender, no cords or erythema, negative Homans sign  Neurological: She is alert and oriented to person, place, and time. She has normal strength. No cranial nerve deficit or sensory deficit. GCS eye subscore is 4. GCS verbal subscore is 5. GCS motor subscore is 6.  Skin: Skin is warm and dry. No rash noted. She is not diaphoretic.  Psychiatric: Her speech is normal.       Cooperative and appropriate    ED Course  Procedures (including critical care time)  Results for orders placed during the hospital encounter of 03/03/12  CBC      Component Value Range   WBC 12.2 (*) 4.0 - 10.5 K/uL   RBC 4.93  3.87 - 5.11 MIL/uL   Hemoglobin 14.1  12.0 - 15.0 g/dL   HCT 21.3  08.6 - 57.8 %   MCV 84.2  78.0 - 100.0 fL   MCH 28.6  26.0 - 34.0 pg   MCHC 34.0  30.0 -  36.0 g/dL   RDW 46.9  62.9 - 52.8 %   Platelets 310  150 - 400 K/uL  COMPREHENSIVE METABOLIC PANEL      Component Value Range   Sodium 141  135 - 145 mEq/L   Potassium 3.5  3.5 - 5.1 mEq/L   Chloride 104  96 - 112 mEq/L   CO2 25  19 - 32 mEq/L   Glucose, Bld 97  70 - 99 mg/dL   BUN 17  6 - 23 mg/dL   Creatinine, Ser 1.61  0.50 - 1.10 mg/dL   Calcium 8.8  8.4 - 09.6 mg/dL   Total Protein 6.5  6.0 - 8.3 g/dL   Albumin 3.4 (*) 3.5 - 5.2 g/dL   AST 12  0 - 37 U/L   ALT 11  0 - 35 U/L   Alkaline Phosphatase 81  39 - 117 U/L   Total Bilirubin 0.2 (*) 0.3 - 1.2 mg/dL   GFR calc non Af Amer 72 (*) >90 mL/min   GFR calc Af Amer 84 (*) >90 mL/min  D-DIMER, QUANTITATIVE      Component Value Range   D-Dimer, Quant 0.32  0.00 - 0.48 ug/mL-FEU  POCT I-STAT, CHEM 8      Component Value Range   Sodium 144  135 - 145 mEq/L   Potassium 3.5  3.5 - 5.1 mEq/L   Chloride 104  96 - 112 mEq/L   BUN 17  6 - 23 mg/dL   Creatinine, Ser 0.45  0.50 - 1.10 mg/dL   Glucose, Bld 89  70 - 99 mg/dL   Calcium, Ion 4.09  8.11 - 1.32 mmol/L   TCO2 25  0 - 100 mmol/L   Hemoglobin 14.6  12.0 - 15.0 g/dL   HCT 91.4  78.2 - 95.6 %  POCT I-STAT TROPONIN I      Component Value Range   Troponin i, poc 0.00  0.00 - 0.08 ng/mL   Comment 3            Dg Chest 2 View  02/29/2012  *RADIOLOGY REPORT*  Clinical Data: Right chest pain, cough  CHEST - 2 VIEW  Comparison: 12/17/2011  Findings: Lungs are clear. No pleural effusion or pneumothorax.  Cardiomediastinal silhouette is within normal limits.  Lower cervical spine fixation hardware.  Cholecystectomy clips.  IMPRESSION: Normal chest radiographs.  Original Report Authenticated By: Charline Bills, M.D.    Date: 03/03/2012  Rate: 71  Rhythm: normal sinus rhythm  QRS Axis: normal  Intervals: normal  ST/T Wave abnormalities: nonspecific ST changes  Conduction Disutrbances:none  Narrative Interpretation:   Old EKG Reviewed: unchanged   Aspirin. Nitroglycerin. IV  narcotics.  4:50 AM case discussed as above with Dr Toniann Fail, on call for triad - he will admit.    MDM   Chest pain with history of coronary artery disease. Imaging, labs and EKG reviewed as above. Plan medical admission.        Sunnie Nielsen, MD 03/03/12 [phone]

## 2012-03-03 NOTE — H&P (Signed)
[name] is an 46 y.o. female.   PCP - Dr.Lawrence Fusco. Cardiologist - Dr.Bensimon. Chief Complaint: Chest pain. HPI: 46 year-old female with history of nonobstructive CAD and has had cardiac Twice once in 2006 and another time in 2008 and has had a stress test last year which was unremarkable and showed an EF of 53% presents to the ER because of chest pain. Patient has had upper back pain for last 5 days. But since last night patient started developing chest pain which is retrosternal increases on exertion and walking and has symptoms of palpitations. Patient also has been having some nausea and had diarrhea for last 2 days. Had vomited once. Denies any shortness of breath productive cough fever chills. In the ER patient had cardiac enzymes and EKG and chest x-ray which does not show anything acute. But her pain has been recurring.  Past Medical History  Diagnosis Date  . Cardiomyopathy     resolved  . Chest pain     chronicc  . Hyperlipidemia   . HTN (hypertension)   . Hypothyroidism   . Adrenal insufficiency   . Anxiety   . Nondiabetic gastroparesis   . CAD (coronary artery disease)     Cath 2008 EF normal. RCA 50-60, Septal 50%. Myoview 3/12: EF 53% normal perfusion  . Chronic back pain   . D (diarrhea)   . History of CHF (congestive heart failure)   . QT prolongation   . Addison disease   . Mitral valve prolapse   . Gastroparesis     Past Surgical History  Procedure Date  . Cardiac catheterization 03/2007    showed 60% lesion in the right coronary artery  . Cholecystectomy   . Spine surgery   . Vesicovaginal fistula closure w/ tah   . Varicose vein surgery   . Abdominal hysterectomy     Family History  Problem Relation Age of Onset  . Coronary artery disease    . Heart attack Mother   . Cancer Father     Colon   Social History:  reports that she has been smoking Cigarettes.  She has been smoking about .5 packs per day. She does not have any smokeless  tobacco history on file. She reports that she does not drink alcohol or use illicit drugs.  Allergies:  Allergies  Allergen Reactions  . Bee Venom Anaphylaxis  . Penicillins Anaphylaxis    Causes anaphylaxis  . Cephalexin Diarrhea  . Doxycycline Other (See Comments)    Due to Pre-Existing conditions involved with stomach, patient does not take the following medication  . Dust Mite Extract Hives and Swelling  . Erythromycin Other (See Comments)    Due to Pre-Existing conditions involved with stomach, patient does not take the following medication  . Morphine And Related Itching  . Sulfonamide Derivatives Itching    Causes itching   . Tramadol Nausea Only     (Not in a hospital admission)  Results for orders placed during the hospital encounter of 03/03/12 (from the past 48 hour(s))  CBC     Status: Abnormal   Collection Time   03/03/12  2:24 AM      Component Value Range Comment   WBC 12.2 (*) 4.0 - 10.5 K/uL    RBC 4.93  3.87 - 5.11 MIL/uL    Hemoglobin 14.1  12.0 - 15.0 g/dL    HCT 40.9  81.1 - 91.4 %    MCV 84.2  78.0 - 100.0 fL  MCH 28.6  26.0 - 34.0 pg    MCHC 34.0  30.0 - 36.0 g/dL    RDW 16.1  09.6 - 04.5 %    Platelets 310  150 - 400 K/uL   COMPREHENSIVE METABOLIC PANEL     Status: Abnormal   Collection Time   03/03/12  2:24 AM      Component Value Range Comment   Sodium 141  135 - 145 mEq/L    Potassium 3.5  3.5 - 5.1 mEq/L    Chloride 104  96 - 112 mEq/L    CO2 25  19 - 32 mEq/L    Glucose, Bld 97  70 - 99 mg/dL    BUN 17  6 - 23 mg/dL    Creatinine, Ser 4.09  0.50 - 1.10 mg/dL    Calcium 8.8  8.4 - 81.1 mg/dL    Total Protein 6.5  6.0 - 8.3 g/dL    Albumin 3.4 (*) 3.5 - 5.2 g/dL    AST 12  0 - 37 U/L    ALT 11  0 - 35 U/L    Alkaline Phosphatase 81  39 - 117 U/L    Total Bilirubin 0.2 (*) 0.3 - 1.2 mg/dL    GFR calc non Af Amer 72 (*) >90 mL/min    GFR calc Af Amer 84 (*) >90 mL/min   D-DIMER, QUANTITATIVE     Status: Normal   Collection Time    03/03/12  2:24 AM      Component Value Range Comment   D-Dimer, Quant 0.32  0.00 - 0.48 ug/mL-FEU   PRO B NATRIURETIC PEPTIDE     Status: Normal   Collection Time   03/03/12  2:24 AM      Component Value Range Comment   Pro B Natriuretic peptide (BNP) 76.9  0 - 125 pg/mL   POCT I-STAT TROPONIN I     Status: Normal   Collection Time   03/03/12  2:42 AM      Component Value Range Comment   Troponin i, poc 0.00  0.00 - 0.08 ng/mL    Comment 3            POCT I-STAT, CHEM 8     Status: Normal   Collection Time   03/03/12  2:44 AM      Component Value Range Comment   Sodium 144  135 - 145 mEq/L    Potassium 3.5  3.5 - 5.1 mEq/L    Chloride 104  96 - 112 mEq/L    BUN 17  6 - 23 mg/dL    Creatinine, Ser 9.14  0.50 - 1.10 mg/dL    Glucose, Bld 89  70 - 99 mg/dL    Calcium, Ion 7.82  9.56 - 1.32 mmol/L    TCO2 25  0 - 100 mmol/L    Hemoglobin 14.6  12.0 - 15.0 g/dL    HCT 21.3  08.6 - 57.8 %    Dg Chest Portable 1 View  03/03/2012  *RADIOLOGY REPORT*  Clinical Data: Chest pain and right arm pain.  History of high blood pressure.  PORTABLE CHEST - 1 VIEW  Comparison: 06/25/2013[facility]  Findings: Normal heart size and pulmonary vascularity.  No focal airspace consolidation in the lungs.  No blunting of costophrenic angles.  No pneumothorax.  Mediastinal contours appear intact.  No significant change since previous study.  IMPRESSION: No evidence of active pulmonary disease.  Original Report Authenticated By: Marlon Pel,  M.D.    Review of Systems  Constitutional: Negative.   HENT: Negative.   Eyes: Negative.   Respiratory: Negative.   Cardiovascular: Positive for chest pain.  Gastrointestinal: Positive for diarrhea.  Genitourinary: Negative.   Musculoskeletal: Negative.   Skin: Negative.   Neurological: Negative.   Endo/Heme/Allergies: Negative.   Psychiatric/Behavioral: Negative.     Blood pressure 102/78, pulse 65, temperature 97.9 F (36.6 C), temperature  source Oral, resp. rate 14, SpO2 97.00%. Physical Exam  Constitutional: She is oriented to person, place, and time. She appears well-developed and well-nourished. No distress.  HENT:  Head: Normocephalic and atraumatic.  Right Ear: External ear normal.  Left Ear: External ear normal.  Mouth/Throat: Oropharynx is clear and moist. No oropharyngeal exudate.  Eyes: Conjunctivae are normal. Pupils are equal, round, and reactive to light. Right eye exhibits no discharge. Left eye exhibits no discharge. No scleral icterus.  Neck: Normal range of motion. Neck supple.  Cardiovascular: Normal rate and regular rhythm.   Respiratory: Effort normal and breath sounds normal. No respiratory distress. She has no wheezes. She has no rales.  GI: Soft. Bowel sounds are normal. She exhibits no distension. There is no tenderness. There is no rebound.  Musculoskeletal: Normal range of motion. She exhibits no edema and no tenderness.  Neurological: She is alert and oriented to person, place, and time.       Moves all extremities.  Skin: Skin is warm and dry. She is not diaphoretic.  Psychiatric: Her behavior is normal.     Assessment/Plan #1. Chest pain to rule out ACS - at this time as patient is having recurring chest pain and is also in addition complaining of upper back pain I have ordered CT angiogram of the chest. Cycle cardiac markers. #2. Addison's disease - continue steroids as taken at home. I have ordered one dose of IV hydrocortisone. Gently hydrate. #3. Diarrhea - patient has been having some diarrhea last 2 days. Has not been on any antibiotics. She did complain of some left lower quadrant pain but on exam it appears benign. Presently patient has no abdominal pain. But if the pain recurs may consider CT abdomen and pelvis. #4. Hypothyroidism - continue Synthroid. Check TSH. #5. History of idiopathic gastroparesis - continue home medications.  Has had history of cardiomyopathy. Last stress test done  last year showed EF of 53%.  CODE STATUS - full code.  Eduard Clos 03/03/2012, 6:51 AM

## 2012-03-03 NOTE — Progress Notes (Signed)
UR Completed. Xianna Siverling, RN, Nurse Case Manager [phone]     

## 2012-03-03 NOTE — ED Notes (Signed)
TRANSPORTED TO CT SCAN.  

## 2012-03-03 NOTE — ED Notes (Signed)
Pt states she is experiencing severe pain in her upper back and neck. She went to [facility] for same within last week. Pt was given percoset and has oxycodone at home. States she stopped taking pain meds b/c she ran out of zofran and is nauseas.

## 2012-03-04 DIAGNOSIS — R079 Chest pain, unspecified: Secondary | ICD-10-CM

## 2012-03-04 DIAGNOSIS — E2749 Other adrenocortical insufficiency: Secondary | ICD-10-CM

## 2012-03-04 DIAGNOSIS — E032 Hypothyroidism due to medicaments and other exogenous substances: Secondary | ICD-10-CM

## 2012-03-04 LAB — CARDIAC PANEL(CRET KIN+CKTOT+MB+TROPI)
Relative Index: INVALID (ref 0.0–2.5)
Troponin I: <0.3 ng/mL (ref <0.30)

## 2012-03-04 MED ORDER — CYCLOBENZAPRINE HCL 10 MG PO TABS: 10.0000 mg | ORAL_TABLET | Freq: Three times a day (TID) | ORAL | Status: DC | PRN

## 2012-03-04 NOTE — Discharge Summary (Signed)
Physician Discharge Summary  [name] BJY:[id number] DOB: Jul 18, 1966 DOA: 03/03/2012  PCP: Cassell Smiles., MD  Admit date: 03/03/2012 Discharge date: 03/04/2012  Discharge Diagnoses:   CHEST PAIN-UNSPECIFIED, muscle skeletal.   HYPOTHYROIDISM  HYPERTENSION, UNSPECIFIED  CARDIOMYOPATHY, SECONDARY  Addison's disease     Discharge Condition: Stable.   Disposition: Needs to follow up with her PCP and neurosurgeon.   Diet: [employer] Healthy.   History of present illness:  46 year-old female with history of nonobstructive CAD and has had cardiac Twice once in 2006 and another time in 2008 and has had a stress test last year which was unremarkable and showed an EF of 53% presents to the ER because of chest pain. Patient has had upper back pain for last 5 days. But since last night patient started developing chest pain which is retrosternal increases on exertion and walking and has symptoms of palpitations. Patient also has been having some nausea and had diarrhea for last 2 days. Had vomited once. Denies any shortness of breath productive cough fever chills. In the ER patient had cardiac enzymes and EKG and chest x-ray which does not show anything acute. But her pain has been recurring.  Hospital Course:   1. Chest pain to rule out ACS - Cardiac enzymes times 3 negative. EKG no ST segment elevation. Chest pain resolved. CT angio negative.  2. Addison's disease - continue steroids as taken at home. I have ordered one dose of IV hydrocortisone. Gently hydrate.  3. Diarrhea - patient has been having some diarrhea last 2 days. Has not been on any antibiotics. She did complain of some left lower quadrant pain but on exam it appears benign. Presently patient has no abdominal pain. Diarrhea has resolved.  4. Hypothyroidism - continue Synthroid. Check TSH.  5. History of idiopathic gastroparesis - continue home medications. 6.Right arm pain, back pain: She has pain on palpation on thoracic spine.  Discussed CT with radiology, no evidence of thoracic spine diseases, no fracture. She had CT of cervical spine that show: Postoperative and degenerative changes throughout the cervical spine as described. No acute displaced fractures. Straightening  of the usual cervical lordosis can be due to patient positioning, ligamentous injury, or muscle spasm. Her pain is more thoracic spine. She run out of flexeril. I will give prescription for it. She will need to follow up with PCP, neurosurgeon. No muscle weakness.     Discharge Exam: Filed Vitals:   03/04/12 0425  BP: 111/72  Pulse: 60  Temp: 98.4 F (36.9 C)  Resp: 18   Filed Vitals:   03/03/12 1447 03/03/12 2130 03/04/12 0201 03/04/12 0425  BP: 99/51 105/57 109/61 111/72  Pulse: 63 68 61 60  Temp: 98 F (36.7 C) 98.3 F (36.8 C) 98.2 F (36.8 C) 98.4 F (36.9 C)  TempSrc: Axillary Oral Oral Oral  Resp: 18 18 18 18   Height:      Weight:    79.6 kg (175 lb 7.8 oz)  SpO2: 96% 97% 97% 97%   General: No distress Cardiovascular: S1, S2 RRR Respiratory: CTA Motor exam: Strength right arm 5/5.  Neuro exam: non focal.   Discharge Instructions  Discharge Orders    Future Orders Please Complete By Expires   Diet - low sodium heart healthy      Increase activity slowly        Medication List  As of 03/04/2012 10:54 AM   STOP taking these medications         promethazine  25 MG tablet      zolpidem 10 MG tablet         TAKE these medications         aspirin 81 MG chewable tablet   Chew 81 mg by mouth daily.      cyclobenzaprine 10 MG tablet   Commonly known as: FLEXERIL   Take 1 tablet (10 mg total) by mouth 3 (three) times daily as needed. For muscle spasms      escitalopram 10 MG tablet   Commonly known as: LEXAPRO   Take 10 mg by mouth every morning.      hydrocortisone 10 MG tablet   Commonly known as: CORTEF   Take 10-20 mg by mouth 2 (two) times daily. Take 2 tablets in the morning and 1 tablets at night          levothyroxine 100 MCG tablet   Commonly known as: SYNTHROID, LEVOTHROID   Take 100 mcg by mouth daily.      LORazepam 1 MG tablet   Commonly known as: ATIVAN   Take 1 mg by mouth 4 (four) times daily as needed. For anxiety      metoCLOPramide 10 MG tablet   Commonly known as: REGLAN   Take 20 mg by mouth 3 (three) times daily before meals.      ondansetron 4 MG tablet   Commonly known as: ZOFRAN   Take 4 mg by mouth every 4 (four) hours as needed. For nausea      oxyCODONE 15 MG immediate release tablet   Commonly known as: ROXICODONE   Take 30 mg by mouth every 6 (six) hours as needed. For pain      pantoprazole 40 MG tablet   Commonly known as: PROTONIX   Take 40 mg by mouth daily as needed. For acid reflux              The results of significant diagnostics from this hospitalization (including imaging, microbiology, ancillary and laboratory) are listed below for reference.    Significant Diagnostic Studies: Dg Chest 2 View  02/29/2012  *RADIOLOGY REPORT*  Clinical Data: Right chest pain, cough  CHEST - 2 VIEW  Comparison: 12/17/2011  Findings: Lungs are clear. No pleural effusion or pneumothorax.  Cardiomediastinal silhouette is within normal limits.  Lower cervical spine fixation hardware.  Cholecystectomy clips.  IMPRESSION: Normal chest radiographs.  Original Report Authenticated By: Charline Bills, M.D.   Dg Thoracic Spine 2 View  02/29/2012  *RADIOLOGY REPORT*  Clinical Data: Right upper chest/back pain  THORACIC SPINE - 2 VIEW  Comparison: None.  Findings: Normal thoracic kyphosis.  No evidence of fracture or dislocation.  Vertebral body heights are maintained.  Lower cervical spine fixation hardware.  Cholecystectomy clips.  Visualized lungs are essentially clear.  IMPRESSION: No evidence of fracture or dislocation.  Original Report Authenticated By: Charline Bills, M.D.   Ct Angio Chest W/cm &/or Wo Cm  03/03/2012  *RADIOLOGY REPORT*  Clinical Data: Chest  pain  CT ANGIOGRAPHY CHEST  Technique:  Multidetector CT imaging of the chest using the standard protocol during bolus administration of intravenous contrast. Multiplanar reconstructed images including MIPs were obtained and reviewed to evaluate the vascular anatomy.  Contrast: OMNIPAQUE IOHEXOL 350 MG/ML SOLN  Comparison: Chest x-ray 03/03/2012  Findings: Negative for pulmonary embolism.  Heart size upper normal.  Minimal coronary artery calcification.  No pericardial effusion.  The aortic arch is not opacified.  No aortic aneurysm. Dissection cannot be excluded due to  lack of contrast in the aorta.  Mild dependent atelectasis in the lung bases.  Negative for pneumonia.  No pleural fluid is present.  Negative for mass or adenopathy.  No acute bony abnormality.  IMPRESSION: Negative for pulmonary embolism.  No acute abnormality.  Original Report Authenticated By: Camelia Phenes, M.D.   Ct Cervical Spine Wo Contrast  02/28/2012  *RADIOLOGY REPORT*  Clinical Data: Neck pain and stiffness radiating down the right shoulder and arm.  No injury.  History of fusion of C2-C4.  CT CERVICAL SPINE WITHOUT CONTRAST  Technique:  Multidetector CT imaging of the cervical spine was performed. Multiplanar CT image reconstructions were also generated.  Comparison: Plain radiographs of the cervical spine 06/17/2011. MRI cervical spine 10/26/2005.  CT cervical spine 09/17/2005.  Findings: Postoperative changes with anterior plate and screw fixation and interbody fusion from C5-C6 and C6-C7.  Residual lucency is demonstrated in the disc space and C5-C6 consistent with incomplete fusion.  Alignment of the fused segments appear stable since the previous study and the fixation hardware appears intact. There is reversal of the usual cervical lordosis which is likely due to patient positioning but ligamentous injury is not excluded. No vertebral compression deformities.  No prevertebral soft tissue swelling.  Hypertrophic  degenerative changes throughout the cervical spine most prominent at C4-5 level and C7-T1 level. Degenerative changes in the cervical facet joints.  No focal bone lesion or bone destruction.  Osteophytes arising from the posterior elements and / or facet joints cause some effacement of the neural foramina bilaterally as C3-4, on the right at C4-5, on the right at C5-6, bilaterally but greater on the right at C6-7, and bilaterally at C7-T1.  Osteophytes or disc osteophyte complexes cause some effacement the anterior thecal sac at C3-4, C4-5, C5-6, C6-7, and C7-T1 levels.  Degenerative changes demonstrate mild progression since the previous study.  IMPRESSION: Postoperative and degenerative changes throughout the cervical spine as described.  No acute displaced fractures.  Straightening of the usual cervical lordosis can be due to patient positioning, ligamentous injury, or muscle spasm.  Original Report Authenticated By: Marlon Pel, M.D.   Dg Chest Portable 1 View  03/03/2012  *RADIOLOGY REPORT*  Clinical Data: Chest pain and right arm pain.  History of high blood pressure.  PORTABLE CHEST - 1 VIEW  Comparison: 06/25/2013from [facility]  Findings: Normal heart size and pulmonary vascularity.  No focal airspace consolidation in the lungs.  No blunting of costophrenic angles.  No pneumothorax.  Mediastinal contours appear intact.  No significant change since previous study.  IMPRESSION: No evidence of active pulmonary disease.  Original Report Authenticated By: Marlon Pel, M.D.    Microbiology: No results found for this or any previous visit (from the past 240 hour(s)).   Labs: Basic Metabolic Panel:  Lab 03/03/12 0981 03/03/12 0244 03/03/12 0224  NA 137 144 141  K 4.2 3.5 --  CL 103 104 104  CO2 23 -- 25  GLUCOSE 113* 89 97  BUN 17 17 17   CREATININE 0.84 1.00 0.92  CALCIUM 8.6 -- 8.8  MG -- -- --  PHOS -- -- --   Liver Function Tests:  Lab 03/03/12 0945 03/03/12 0224   AST 12 12  ALT 9 11  ALKPHOS 80 81  BILITOT 0.2* 0.2*  PROT 6.8 6.5  ALBUMIN 3.4* 3.4*   CBC:  Lab 03/03/12 0945 03/03/12 0244 03/03/12 0224  WBC 11.6* -- 12.2*  NEUTROABS -- -- --  HGB 14.0 14.6  14.1  HCT 41.6 43.0 41.5  MCV 85.6 -- 84.2  PLT 316 -- 310   Cardiac Enzymes:  Lab 03/04/12 0023 03/03/12 1655 03/03/12 0942  CKTOTAL 50 48 61  CKMB 1.3 1.2 1.3  CKMBINDEX -- -- --  TROPONINI <0.30 <0.30 <0.30    Time coordinating discharge: 30 minutes.   SignedHartley Barefoot  [facility] 03/04/2012, 10:54 AM

## 2012-03-04 NOTE — Progress Notes (Signed)
Discharge insructions given verbalized understandi

## 2012-03-06 ENCOUNTER — Other Ambulatory Visit (HOSPITAL_COMMUNITY): Payer: Self-pay | Admitting: Internal Medicine

## 2012-03-06 DIAGNOSIS — Z139 Encounter for screening, unspecified: Secondary | ICD-10-CM

## 2012-03-06 DIAGNOSIS — Z0000 Encounter for general adult medical examination without abnormal findings: Secondary | ICD-10-CM

## 2012-03-13 ENCOUNTER — Encounter (HOSPITAL_COMMUNITY): Payer: Self-pay | Admitting: *Deleted

## 2012-03-13 ENCOUNTER — Ambulatory Visit (HOSPITAL_COMMUNITY)
Admission: RE | Admit: 2012-03-13 | Discharge: 2012-03-13 | Disposition: A | Discharge status: Home / Self Care | Payer: BC Managed Care – PPO | Source: Ambulatory Visit | Attending: Internal Medicine | Admitting: Internal Medicine

## 2012-03-13 ENCOUNTER — Other Ambulatory Visit (HOSPITAL_COMMUNITY): Payer: Self-pay | Admitting: Internal Medicine

## 2012-03-13 ENCOUNTER — Other Ambulatory Visit: Payer: Self-pay

## 2012-03-13 DIAGNOSIS — M5124 Other intervertebral disc displacement, thoracic region: Secondary | ICD-10-CM | POA: Insufficient documentation

## 2012-03-13 DIAGNOSIS — E039 Hypothyroidism, unspecified: Secondary | ICD-10-CM | POA: Insufficient documentation

## 2012-03-13 DIAGNOSIS — Z1231 Encounter for screening mammogram for malignant neoplasm of breast: Secondary | ICD-10-CM | POA: Insufficient documentation

## 2012-03-13 DIAGNOSIS — M5020 Other cervical disc displacement, unspecified cervical region: Secondary | ICD-10-CM | POA: Insufficient documentation

## 2012-03-13 DIAGNOSIS — M5030 Other cervical disc degeneration, unspecified cervical region: Secondary | ICD-10-CM | POA: Insufficient documentation

## 2012-03-13 DIAGNOSIS — I10 Essential (primary) hypertension: Secondary | ICD-10-CM | POA: Insufficient documentation

## 2012-03-13 DIAGNOSIS — S479XXA Crushing injury of shoulder and upper arm, unspecified arm, initial encounter: Secondary | ICD-10-CM | POA: Insufficient documentation

## 2012-03-13 DIAGNOSIS — M546 Pain in thoracic spine: Secondary | ICD-10-CM | POA: Insufficient documentation

## 2012-03-13 DIAGNOSIS — M5412 Radiculopathy, cervical region: Secondary | ICD-10-CM

## 2012-03-13 DIAGNOSIS — M25529 Pain in unspecified elbow: Secondary | ICD-10-CM | POA: Insufficient documentation

## 2012-03-13 DIAGNOSIS — Z0000 Encounter for general adult medical examination without abnormal findings: Secondary | ICD-10-CM

## 2012-03-13 DIAGNOSIS — M542 Cervicalgia: Secondary | ICD-10-CM | POA: Insufficient documentation

## 2012-03-13 DIAGNOSIS — Y9289 Other specified places as the place of occurrence of the external cause: Secondary | ICD-10-CM | POA: Insufficient documentation

## 2012-03-13 DIAGNOSIS — S9780XA Crushing injury of unspecified foot, initial encounter: Secondary | ICD-10-CM | POA: Insufficient documentation

## 2012-03-13 DIAGNOSIS — IMO0002 Reserved for concepts with insufficient information to code with codable children: Secondary | ICD-10-CM | POA: Insufficient documentation

## 2012-03-13 DIAGNOSIS — Z139 Encounter for screening, unspecified: Secondary | ICD-10-CM
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
## 2012-03-13 NOTE — ED Provider Notes (Signed)
Medical screening examination/treatment/procedure(s) were performed by non-physician practitioner and as supervising physician I was immediately available for consultation/collaboration.  Teliah Buffalo, MD 03/13/12 0719 

## 2012-03-14 ENCOUNTER — Emergency Department (HOSPITAL_COMMUNITY): Payer: BC Managed Care – PPO

## 2012-03-14 ENCOUNTER — Emergency Department (HOSPITAL_COMMUNITY)
Admission: EM | Admit: 2012-03-14 | Discharge: 2012-03-14 | Disposition: A | Discharge status: Home / Self Care | Payer: BC Managed Care – PPO | Attending: Emergency Medicine | Admitting: Emergency Medicine

## 2012-03-14 ENCOUNTER — Other Ambulatory Visit (HOSPITAL_COMMUNITY): Payer: Self-pay | Admitting: Internal Medicine

## 2012-03-14 DIAGNOSIS — S472XXA Crushing injury of left shoulder and upper arm, initial encounter: Secondary | ICD-10-CM

## 2012-03-14 DIAGNOSIS — S9781XA Crushing injury of right foot, initial encounter: Secondary | ICD-10-CM

## 2012-03-14 DIAGNOSIS — M5412 Radiculopathy, cervical region: Secondary | ICD-10-CM

## 2012-03-14 LAB — CBC WITH DIFFERENTIAL/PLATELET
Basophils Absolute: 0.1 10*3/uL (ref 0.0–0.1)
Basophils Relative: 0 % (ref 0–1)
Hemoglobin: 15.8 g/dL — ABNORMAL HIGH (ref 12.0–15.0)
MCHC: 35 g/dL (ref 30.0–36.0)
Monocytes Relative: 5 % (ref 3–12)
Neutro Abs: 10.9 10*3/uL — ABNORMAL HIGH (ref 1.7–7.7)
Neutrophils Relative %: 70 % (ref 43–77)
RBC: 5.31 MIL/uL — ABNORMAL HIGH (ref 3.87–5.11)

## 2012-03-14 LAB — COMPREHENSIVE METABOLIC PANEL
ALT: 11 U/L (ref 0–35)
AST: 16 U/L (ref 0–37)
Albumin: 4.2 g/dL (ref 3.5–5.2)
Alkaline Phosphatase: 78 U/L (ref 39–117)
BUN: 8 mg/dL (ref 6–23)
Chloride: 99 mEq/L (ref 96–112)
Potassium: 3.4 mEq/L — ABNORMAL LOW (ref 3.5–5.1)
Total Bilirubin: 0.3 mg/dL (ref 0.3–1.2)

## 2012-03-14 MED ORDER — NAPROXEN 500 MG PO TABS: 500.0000 mg | ORAL_TABLET | Freq: Two times a day (BID) | ORAL | Status: DC

## 2012-03-14 MED ORDER — ONDANSETRON 4 MG PO TBDP
ORAL_TABLET | ORAL | Status: AC
  Administered 2012-03-14: 4 mg
  Filled 2012-03-14: qty 1

## 2012-03-14 MED ORDER — HYDROMORPHONE HCL PF 1 MG/ML IJ SOLN
1.0000 mg | Freq: Once | INTRAMUSCULAR | Status: AC
  Administered 2012-03-14: 1 mg via INTRAMUSCULAR
  Filled 2012-03-14: qty 1
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
## 2012-03-14 NOTE — ED Notes (Signed)
Pt reports pain has improved

## 2012-03-14 NOTE — ED Notes (Signed)
Ortho tech paged  

## 2012-03-14 NOTE — ED Notes (Signed)
Patient transported to X-ray 

## 2012-03-18 ENCOUNTER — Emergency Department (HOSPITAL_COMMUNITY)
Admission: EM | Admit: 2012-03-18 | Discharge: 2012-03-18 | Disposition: A | Discharge status: Home / Self Care | Payer: BC Managed Care – PPO | Attending: Emergency Medicine | Admitting: Emergency Medicine

## 2012-03-18 ENCOUNTER — Emergency Department (HOSPITAL_COMMUNITY): Payer: BC Managed Care – PPO

## 2012-03-18 ENCOUNTER — Encounter (HOSPITAL_COMMUNITY): Payer: Self-pay | Admitting: *Deleted

## 2012-03-18 DIAGNOSIS — E785 Hyperlipidemia, unspecified: Secondary | ICD-10-CM | POA: Insufficient documentation

## 2012-03-18 DIAGNOSIS — M25519 Pain in unspecified shoulder: Secondary | ICD-10-CM | POA: Insufficient documentation

## 2012-03-18 DIAGNOSIS — F17200 Nicotine dependence, unspecified, uncomplicated: Secondary | ICD-10-CM | POA: Insufficient documentation

## 2012-03-18 DIAGNOSIS — H109 Unspecified conjunctivitis: Secondary | ICD-10-CM | POA: Insufficient documentation

## 2012-03-18 DIAGNOSIS — I10 Essential (primary) hypertension: Secondary | ICD-10-CM | POA: Insufficient documentation

## 2012-03-18 DIAGNOSIS — R51 Headache: Secondary | ICD-10-CM | POA: Insufficient documentation

## 2012-03-18 DIAGNOSIS — R059 Cough, unspecified: Secondary | ICD-10-CM | POA: Insufficient documentation

## 2012-03-18 DIAGNOSIS — M25511 Pain in right shoulder: Secondary | ICD-10-CM

## 2012-03-18 DIAGNOSIS — F411 Generalized anxiety disorder: Secondary | ICD-10-CM | POA: Insufficient documentation

## 2012-03-18 DIAGNOSIS — R05 Cough: Secondary | ICD-10-CM | POA: Insufficient documentation

## 2012-03-18 DIAGNOSIS — Z9889 Other specified postprocedural states: Secondary | ICD-10-CM | POA: Insufficient documentation

## 2012-03-18 DIAGNOSIS — R0789 Other chest pain: Secondary | ICD-10-CM | POA: Insufficient documentation

## 2012-03-18 DIAGNOSIS — Z79899 Other long term (current) drug therapy: Secondary | ICD-10-CM | POA: Insufficient documentation

## 2012-03-18 DIAGNOSIS — I509 Heart failure, unspecified: Secondary | ICD-10-CM | POA: Insufficient documentation

## 2012-03-18 DIAGNOSIS — R5381 Other malaise: Secondary | ICD-10-CM | POA: Insufficient documentation

## 2012-03-18 DIAGNOSIS — H5789 Other specified disorders of eye and adnexa: Secondary | ICD-10-CM | POA: Insufficient documentation

## 2012-03-18 DIAGNOSIS — I2510 Atherosclerotic heart disease of native coronary artery without angina pectoris: Secondary | ICD-10-CM | POA: Insufficient documentation

## 2012-03-18 DIAGNOSIS — E039 Hypothyroidism, unspecified: Secondary | ICD-10-CM | POA: Insufficient documentation

## 2012-03-18 DIAGNOSIS — M549 Dorsalgia, unspecified: Secondary | ICD-10-CM | POA: Insufficient documentation

## 2012-03-18 DIAGNOSIS — H11419 Vascular abnormalities of conjunctiva, unspecified eye: Secondary | ICD-10-CM | POA: Insufficient documentation

## 2012-03-18 DIAGNOSIS — Z7982 Long term (current) use of aspirin: Secondary | ICD-10-CM | POA: Insufficient documentation

## 2012-03-18 DIAGNOSIS — R0602 Shortness of breath: Secondary | ICD-10-CM | POA: Insufficient documentation

## 2012-03-18 HISTORY — DX: Heart failure, unspecified: I50.9

## 2012-03-18 LAB — CBC WITH DIFFERENTIAL/PLATELET
Eosinophils Absolute: 0.3 10*3/uL (ref 0.0–0.7)
Lymphocytes Relative: 22 % (ref 12–46)
Lymphs Abs: 3.7 10*3/uL (ref 0.7–4.0)
Neutrophils Relative %: 70 % (ref 43–77)
Platelets: 358 10*3/uL (ref 150–400)
RBC: 4.89 MIL/uL (ref 3.87–5.11)
WBC: 16.7 10*3/uL — ABNORMAL HIGH (ref 4.0–10.5)

## 2012-03-18 LAB — COMPREHENSIVE METABOLIC PANEL
Albumin: 3.2 g/dL — ABNORMAL LOW (ref 3.5–5.2)
BUN: 11 mg/dL (ref 6–23)
Calcium: 8.9 mg/dL (ref 8.4–10.5)
Chloride: 101 mEq/L (ref 96–112)
Creatinine, Ser: 0.84 mg/dL (ref 0.50–1.10)
GFR calc non Af Amer: 81 mL/min — ABNORMAL LOW (ref >90)
Total Bilirubin: 0.3 mg/dL (ref 0.3–1.2)

## 2012-03-18 LAB — CARDIAC PANEL(CRET KIN+CKTOT+MB+TROPI)
CK, MB: 4.6 ng/mL — ABNORMAL HIGH (ref 0.3–4.0)
Total CK: 204 U/L — ABNORMAL HIGH (ref 7–177)
Troponin I: <0.3 ng/mL (ref <0.30)

## 2012-03-18 MED ORDER — ALBUTEROL SULFATE (5 MG/ML) 0.5% IN NEBU
5.0000 mg | INHALATION_SOLUTION | Freq: Once | RESPIRATORY_TRACT | Status: AC
  Administered 2012-03-18: 5 mg via RESPIRATORY_TRACT
  Filled 2012-03-18: qty 40

## 2012-03-18 MED ORDER — HYDROMORPHONE HCL PF 1 MG/ML IJ SOLN
1.0000 mg | Freq: Once | INTRAMUSCULAR | Status: AC
  Administered 2012-03-18: 1 mg via INTRAVENOUS

## 2012-03-18 MED ORDER — METHYLPREDNISOLONE SODIUM SUCC 125 MG IJ SOLR
125.0000 mg | Freq: Once | INTRAMUSCULAR | Status: AC
  Administered 2012-03-18: 125 mg via INTRAVENOUS
  Filled 2012-03-18: qty 2

## 2012-03-18 MED ORDER — IPRATROPIUM BROMIDE 0.02 % IN SOLN
0.5000 mg | Freq: Once | RESPIRATORY_TRACT | Status: AC
  Administered 2012-03-18: 0.5 mg via RESPIRATORY_TRACT
  Filled 2012-03-18: qty 2.5

## 2012-03-18 MED ORDER — HYDROMORPHONE HCL PF 1 MG/ML IJ SOLN
1.0000 mg | Freq: Once | INTRAMUSCULAR | Status: AC
  Administered 2012-03-18: 13:00:00 via INTRAVENOUS
  Filled 2012-03-18: qty 1

## 2012-03-18 MED ORDER — SODIUM CHLORIDE 0.9 % IV SOLN
Freq: Once | INTRAVENOUS | Status: AC
  Administered 2012-03-18: 12:00:00 via INTRAVENOUS

## 2012-03-18 MED ORDER — PREDNISONE 10 MG PO TABS: 20.0000 mg | ORAL_TABLET | Freq: Two times a day (BID) | ORAL | Status: DC

## 2012-03-18 MED ORDER — HYDROMORPHONE HCL PF 1 MG/ML IJ SOLN
INTRAMUSCULAR | Status: AC
  Filled 2012-03-18: qty 1

## 2012-03-18 MED ORDER — GENTAMICIN SULFATE 0.3 % OP SOLN: 1.0000 [drp] | OPHTHALMIC | Status: AC

## 2012-03-18 MED ORDER — KETOROLAC TROMETHAMINE 30 MG/ML IJ SOLN
30.0000 mg | Freq: Once | INTRAMUSCULAR | Status: AC
  Administered 2012-03-18: 30 mg via INTRAVENOUS
  Filled 2012-03-18: qty 1

## 2012-03-18 NOTE — ED Provider Notes (Addendum)
History     CSN: [id number]  Arrival date & time 03/18/12  1102   First MD Initiated Contact with Patient 03/18/12 1111      Chief Complaint  Patient presents with  . Chest Pain    (Consider location/radiation/quality/duration/timing/severity/associated sxs/prior treatment) HPI Comments: Patient started last night with pain in the right mid back and chest that radiates into the right shoulder.  It is worse with movement of the arm and cannot lift it due to the pain.  She has a history of copd, chf, and tobacco abuse.  She denies fevers or chills.  She does also complain of headache, redness of the right eye, feeling weak and fatigued.  She had an mri not too long for her neck and she tells me her doctor is trying to set her up with a Careers adviser.    Patient is a 46 y.o. female presenting with chest pain. The history is provided by the patient.  Chest Pain The chest pain began yesterday. Chest pain occurs constantly. The chest pain is worsening. The pain is associated with breathing, coughing, stress, exertion and lifting. The severity of the pain is severe. The quality of the pain is described as sharp. The pain radiates to the right shoulder. Chest pain is worsened by certain positions (movement, palpation). Primary symptoms include shortness of breath and cough. Pertinent negatives for primary symptoms include no fever.     Past Medical History  Diagnosis Date  . Cardiomyopathy     resolved  . Chest pain     chronicc  . Hyperlipidemia   . HTN (hypertension)   . Hypothyroidism   . Adrenal insufficiency   . Anxiety   . Nondiabetic gastroparesis   . CAD (coronary artery disease)     Cath 2008 EF normal. RCA 50-60, Septal 50%. Myoview 3/12: EF 53% normal perfusion  . Chronic back pain   . D (diarrhea)   . History of CHF (congestive heart failure)   . QT prolongation   . Addison disease   . Mitral valve prolapse   . Gastroparesis   . CHF (congestive heart failure)     Past  Surgical History  Procedure Date  . Cardiac catheterization 03/2007    showed 60% lesion in the right coronary artery  . Cholecystectomy   . Spine surgery   . Vesicovaginal fistula closure w/ tah   . Varicose vein surgery   . Abdominal hysterectomy     Family History  Problem Relation Age of Onset  . Coronary artery disease    . Heart attack Mother   . Cancer Father     Colon    History  Substance Use Topics  . Smoking status: Current Everyday Smoker -- 0.5 packs/day for 10 years    Types: Cigarettes  . Smokeless tobacco: Never Used   Comment: smokes 6-7 cigarettes per day  . Alcohol Use: No    OB History    Grav Para Term Preterm Abortions TAB SAB Ect Mult Living                  Review of Systems  Constitutional: Negative for fever.  Respiratory: Positive for cough and shortness of breath.   Cardiovascular: Positive for chest pain.  All other systems reviewed and are negative.    Allergies  Bee venom; Penicillins; Cephalexin; Doxycycline; Dust mite extract; Erythromycin; Morphine and related; Sulfonamide derivatives; and Tramadol  Home Medications   Current Outpatient Rx  Name Route Sig Dispense Refill  .  ASPIRIN 81 MG PO CHEW Oral Chew 81 mg by mouth daily.    . CYCLOBENZAPRINE HCL 10 MG PO TABS Oral Take 10 mg by mouth 3 (three) times daily as needed. spasm    . ESCITALOPRAM OXALATE 10 MG PO TABS Oral Take 10 mg by mouth every morning.    Marland Kitchen HYDROCORTISONE 10 MG PO TABS Oral Take 10-20 mg by mouth 2 (two) times daily. Take 2 tablets in the morning and 1 tablets at night usually.  Currently taking 4 tablets in the morning and 2 tablets at night due to feeling sick for 7 days. Started double dose on 03/18/12    . LEVOTHYROXINE SODIUM 100 MCG PO TABS Oral Take 100 mcg by mouth daily.     Marland Kitchen LORAZEPAM 1 MG PO TABS Oral Take 1 mg by mouth 4 (four) times daily as needed. For anxiety    . METOCLOPRAMIDE HCL 10 MG PO TABS Oral Take 20 mg by mouth 3 (three) times daily  before meals.     Marland Kitchen ONDANSETRON HCL 4 MG PO TABS Oral Take 4 mg by mouth every 4 (four) hours as needed. For nausea    . OXYCODONE HCL 15 MG PO TABS Oral Take 30 mg by mouth every 6 (six) hours as needed. For pain    . PANTOPRAZOLE SODIUM 40 MG PO TBEC Oral Take 40 mg by mouth daily as needed. For acid reflux    . ZOLPIDEM TARTRATE 10 MG PO TABS Oral Take 10 mg by mouth at bedtime as needed. sleep      BP 124/81  Pulse 75  Temp 98.4 F (36.9 C) (Oral)  Resp 16  SpO2 95%  Physical Exam  Nursing note and vitals reviewed. Constitutional: She is oriented to person, place, and time. She appears well-developed and well-nourished. No distress.  HENT:  Head: Normocephalic and atraumatic.  Eyes: Pupils are equal, round, and reactive to light.       The right conjunctiva is injected with crusting.  Neck: Normal range of motion. Neck supple.  Cardiovascular: Normal rate and regular rhythm.   No murmur heard. Pulmonary/Chest: Effort normal.       There are bilateral rhonchi present.    Abdominal: Soft. Bowel sounds are normal. She exhibits no distension. There is no tenderness.  Musculoskeletal: Normal range of motion. She exhibits no edema.  Lymphadenopathy:    She has no cervical adenopathy.  Neurological: She is alert and oriented to person, place, and time. No cranial nerve deficit. Coordination normal.  Skin: Skin is warm and dry. She is not diaphoretic.    ED Course  Procedures (including critical care time)   Labs Reviewed  CBC WITH DIFFERENTIAL  COMPREHENSIVE METABOLIC PANEL  CARDIAC PANEL(CRET KIN+CKTOT+MB+TROPI)   No results found.   No diagnosis found.   Date: 03/18/2012  Rate: 83  Rhythm: normal sinus rhythm  QRS Axis: normal  Intervals: normal  ST/T Wave abnormalities: normal  Conduction Disutrbances:none  Narrative Interpretation:   Old EKG Reviewed: unchanged    MDM    The patient's symptoms in no way sound cardiac by history or exam.  The ekg, labs,  and chest xray are all unremarkable.  This is most likely musculoskeletal.  She is feeling better with the meds given here and wants to go home.  She will be discharged with prednisone to be taken as an anti-inflammatory.  To return or follow up prn.      Geoffery Lyons, MD 03/18/12 1353  Geoffery Lyons,  MD 03/19/12 [phone]

## 2012-03-18 NOTE — ED Notes (Signed)
Patient transported to X-ray 
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
## 2012-03-18 NOTE — ED Notes (Signed)
Back from xray

## 2012-03-18 NOTE — ED Notes (Signed)
Resting quietly with eyes closed arouses easily

## 2012-03-18 NOTE — ED Notes (Signed)
Reports having mid chest pains last night, woke up this am with pain in right arm, unable to lift her right arm due to pain. ekg done at triage.

## 2012-03-18 NOTE — ED Notes (Signed)
Patient alert and oriented times 4, She advises that her pain is down to a "1" She is discharged with her daughter with instructions and prescriptions given as well as follow up care needed. Patient and her daughter verbalize an understanding.

## 2012-03-18 NOTE — ED Notes (Signed)
Medications given as directed patient advises that the medication is not helping with her pain. She states " I take Oxycodone 30 mgs every 6 hours at home."  ED doctor notified Dr. Judd Lien.

## 2012-03-29 ENCOUNTER — Encounter (HOSPITAL_COMMUNITY): Payer: Self-pay | Admitting: Nurse Practitioner

## 2012-03-29 ENCOUNTER — Emergency Department (HOSPITAL_COMMUNITY)
Admission: EM | Admit: 2012-03-29 | Discharge: 2012-03-29 | Disposition: A | Discharge status: Home / Self Care | Payer: BC Managed Care – PPO | Attending: Emergency Medicine | Admitting: Emergency Medicine

## 2012-03-29 ENCOUNTER — Emergency Department (HOSPITAL_COMMUNITY): Payer: BC Managed Care – PPO

## 2012-03-29 DIAGNOSIS — E039 Hypothyroidism, unspecified: Secondary | ICD-10-CM | POA: Insufficient documentation

## 2012-03-29 DIAGNOSIS — Z79899 Other long term (current) drug therapy: Secondary | ICD-10-CM | POA: Insufficient documentation

## 2012-03-29 DIAGNOSIS — R079 Chest pain, unspecified: Secondary | ICD-10-CM | POA: Insufficient documentation

## 2012-03-29 DIAGNOSIS — I10 Essential (primary) hypertension: Secondary | ICD-10-CM | POA: Insufficient documentation

## 2012-03-29 DIAGNOSIS — R10812 Left upper quadrant abdominal tenderness: Secondary | ICD-10-CM | POA: Insufficient documentation

## 2012-03-29 DIAGNOSIS — R51 Headache: Secondary | ICD-10-CM

## 2012-03-29 DIAGNOSIS — K2900 Acute gastritis without bleeding: Secondary | ICD-10-CM

## 2012-03-29 LAB — CBC
MCH: 30 pg (ref 26.0–34.0)
MCV: 86.1 fL (ref 78.0–100.0)
Platelets: 345 10*3/uL (ref 150–400)
RDW: 15.4 % (ref 11.5–15.5)
WBC: 14 10*3/uL — ABNORMAL HIGH (ref 4.0–10.5)

## 2012-03-29 LAB — POCT I-STAT TROPONIN I: Troponin i, poc: 0 ng/mL (ref 0.00–0.08)

## 2012-03-29 LAB — BASIC METABOLIC PANEL
Calcium: 9.1 mg/dL (ref 8.4–10.5)
Creatinine, Ser: 0.72 mg/dL (ref 0.50–1.10)
GFR calc Af Amer: >90 mL/min (ref >90)
Sodium: 139 mEq/L (ref 135–145)

## 2012-03-29 MED ORDER — DIPHENHYDRAMINE HCL 50 MG/ML IJ SOLN
25.0000 mg | Freq: Once | INTRAMUSCULAR | Status: AC
  Administered 2012-03-29: 25 mg via INTRAVENOUS
  Filled 2012-03-29: qty 1

## 2012-03-29 MED ORDER — HYDROMORPHONE HCL PF 2 MG/ML IJ SOLN
2.0000 mg | Freq: Once | INTRAMUSCULAR | Status: AC
  Administered 2012-03-29: 2 mg via INTRAVENOUS
  Filled 2012-03-29: qty 1

## 2012-03-29 MED ORDER — METOCLOPRAMIDE HCL 5 MG/ML IJ SOLN
10.0000 mg | Freq: Once | INTRAMUSCULAR | Status: AC
  Administered 2012-03-29: 10 mg via INTRAVENOUS
  Filled 2012-03-29: qty 2

## 2012-03-29 MED ORDER — DEXAMETHASONE SODIUM PHOSPHATE 10 MG/ML IJ SOLN
10.0000 mg | Freq: Once | INTRAMUSCULAR | Status: AC
  Administered 2012-03-29: 10 mg via INTRAVENOUS
  Filled 2012-03-29: qty 1

## 2012-03-29 NOTE — ED Notes (Signed)
C/o headaches and nasuea over past few days, then last night started to have "pressure and burning " in chest, constant since onset. A&Ox4, resp e/u

## 2012-03-29 NOTE — ED Provider Notes (Cosign Needed)
History     CSN: [id number]  Arrival date & time 03/29/12  1137   None     Chief Complaint  Patient presents with  . Chest Pain    (Consider location/radiation/quality/duration/timing/severity/associated sxs/prior treatment) HPI Comments: Pt had onset of headache in the forehead and occipital region for 3 days.  She has chronic pain and has been taking oxycodone for that, without relief of headache.  She has no prior history of chronic headache.  She has many chronic medical problems:  Addison's disease, hypothyroidism, non-diabetic gastroparesis, GERD, anxiety and depression.  She has had upper abdominal and chest pain, with severe nausea today, preventing her from taking her medications today.  She therefore sought evaluation.  Patient is a 46 y.o. female presenting with headaches. The history is provided by the patient and medical records. No language interpreter was used.  Headache  This is a new problem. The current episode started more than 2 days ago. The problem occurs constantly. The problem has not changed since onset.The headache is associated with nothing. Pain location: Bifrontal and occipital. The pain is at a severity of 8/10. The pain is severe. Associated symptoms include anorexia, nausea and vomiting. Associated symptoms comments: Chest pain . She has tried nothing for the symptoms.    Past Medical History  Diagnosis Date  . Cardiomyopathy     resolved  . Chest pain     chronicc  . Hyperlipidemia   . HTN (hypertension)   . Hypothyroidism   . Adrenal insufficiency   . Anxiety   . Nondiabetic gastroparesis   . CAD (coronary artery disease)     Cath 2008 EF normal. RCA 50-60, Septal 50%. Myoview 3/12: EF 53% normal perfusion  . Chronic back pain   . D (diarrhea)   . History of CHF (congestive heart failure)   . QT prolongation   . Addison disease   . Mitral valve prolapse   . Gastroparesis   . CHF (congestive heart failure)     Past Surgical History    Procedure Date  . Cardiac catheterization 03/2007    showed 60% lesion in the right coronary artery  . Cholecystectomy   . Spine surgery   . Vesicovaginal fistula closure w/ tah   . Varicose vein surgery   . Abdominal hysterectomy     Family History  Problem Relation Age of Onset  . Coronary artery disease    . Heart attack Mother   . Cancer Father     Colon    History  Substance Use Topics  . Smoking status: Current Everyday Smoker -- 0.5 packs/day for 10 years    Types: Cigarettes  . Smokeless tobacco: Never Used   Comment: smokes 6-7 cigarettes per day  . Alcohol Use: No    OB History    Grav Para Term Preterm Abortions TAB SAB Ect Mult Living                  Review of Systems  Constitutional: Negative.   HENT:       Frontal and occipital headache.  Eyes: Negative.   Respiratory: Negative.   Cardiovascular: Positive for chest pain.  Gastrointestinal: Positive for nausea, vomiting, abdominal pain and anorexia.  Genitourinary: Negative.   Musculoskeletal: Negative.   Skin: Negative.   Neurological: Positive for headaches.  Psychiatric/Behavioral: Negative.     Allergies  Bee venom; Penicillins; Cephalexin; Doxycycline; Dust mite extract; Erythromycin; Morphine and related; Sulfonamide derivatives; and Tramadol  Home Medications   Current  Outpatient Rx  Name Route Sig Dispense Refill  . ASPIRIN 81 MG PO CHEW Oral Chew 325 mg by mouth daily.     Marland Kitchen ESCITALOPRAM OXALATE 10 MG PO TABS Oral Take 10 mg by mouth every morning.    Marland Kitchen HYDROCORTISONE 10 MG PO TABS Oral Take 10-20 mg by mouth 2 (two) times daily. Take 2 tablets in the morning and 1 tablets at night usually.  Currently taking 4 tablets in the morning and 2 tablets at night due to feeling sick for 7 days. Started double dose on 03/18/12    . LEVOTHYROXINE SODIUM 100 MCG PO TABS Oral Take 100 mcg by mouth daily.     Marland Kitchen LORAZEPAM 1 MG PO TABS Oral Take 1 mg by mouth 4 (four) times daily as needed. For  anxiety    . METOCLOPRAMIDE HCL 10 MG PO TABS Oral Take 20 mg by mouth 3 (three) times daily as needed. Runny stools    . ONDANSETRON HCL 4 MG PO TABS Oral Take 4-8 mg by mouth every 4 (four) hours as needed. For nausea    . OXYCODONE HCL 15 MG PO TABS Oral Take 30 mg by mouth every 6 (six) hours as needed. For pain    . PANTOPRAZOLE SODIUM 40 MG PO TBEC Oral Take 40 mg by mouth daily as needed. For acid reflux    . ZOLPIDEM TARTRATE 10 MG PO TABS Oral Take 10 mg by mouth at bedtime as needed. sleep    . GENTAMICIN SULFATE 0.3 % OP SOLN Both Eyes Place 1 drop into both eyes every 4 (four) hours. 5 mL 0    BP 132/59  Pulse 60  Temp 98.7 F (37.1 C) (Oral)  Resp 18  SpO2 100%  Physical Exam  Nursing note and vitals reviewed. Constitutional: She is oriented to person, place, and time. She appears well-developed and well-nourished.       In moderate distress with frontal and occipital headache.  HENT:  Head: Normocephalic and atraumatic.  Right Ear: External ear normal.  Left Ear: External ear normal.  Eyes: Conjunctivae and EOM are normal. Pupils are equal, round, and reactive to light.  Neck: Normal range of motion. Neck supple.  Cardiovascular: Normal rate, regular rhythm and normal heart sounds.   Pulmonary/Chest: Effort normal and breath sounds normal.  Abdominal: She exhibits no distension and no mass. There is tenderness. There is no rebound.       Mild left upper quadrant tenderness.  No mass or rebound or rigidity.  Musculoskeletal: Normal range of motion. She exhibits no edema and no tenderness.  Neurological: She is alert and oriented to person, place, and time.       No sensory or motor deficit.  Skin: Skin is warm and dry.  Psychiatric: She has a normal mood and affect. Her behavior is normal.    ED Course  Procedures (including critical care time)  Labs Reviewed  CBC - Abnormal; Notable for the following:    WBC 14.0 (*)     RBC 5.26 (*)     Hemoglobin 15.8 (*)      All other components within normal limits  BASIC METABOLIC PANEL - Abnormal; Notable for the following:    Glucose, Bld 103 (*)     All other components within normal limits  POCT I-STAT TROPONIN I   Dg Chest 2 View  03/29/2012  *RADIOLOGY REPORT*  Clinical Data: 46 year old female with chest pain cough nausea headache.  CHEST - 2 VIEW  Comparison: 03/18/2012 and earlier.  Findings: Lung volumes are stable and within normal limits. Cardiac size and mediastinal contours are within normal limits. Visualized tracheal air column is within normal limits.  Lung parenchyma is stable with mild increased interstitial markings.  No pneumothorax, pulmonary edema, pleural effusion or confluent pulmonary opacity.  Stable right upper quadrant surgical clips. No acute osseous abnormality identified.    Cervical ACDF hardware again noted.  IMPRESSION: No acute cardiopulmonary abnormality.  Original Report Authenticated By: Harley Hallmark, M.D.   3:03 PM  Date: 03/29/2012  Rate: 61  Rhythm: normal sinus rhythm  QRS Axis: normal  Intervals: normal PQRS:  Left atrial enlargement.  ST/T Wave abnormalities: normal  Conduction Disutrbances:none  Narrative Interpretation: Borderline EKG  Old EKG Reviewed: unchanged  Pt was seen and had physical exam.  Lab workup for chest pain was negative, reported these results to pt.  Rx with migraine cocktail.  4:39 PM Pt's abdominal pain and nausea are better but her headache persists.  Will give rescue medication of Dilaudid 2 mg IV.  After that she can go home.  She has an appointment to see her PCP, Elfredia Nevins, M.D., in the office in 2 days.  1. Headache   2. Acute gastritis           Carleene Cooper III, MD 03/29/12 [phone]

## 2012-04-05 ENCOUNTER — Telehealth: Payer: Self-pay | Admitting: Internal Medicine

## 2012-04-05 NOTE — Telephone Encounter (Signed)
New Problem:    Patient called in believing that the artery blockages in her heart have gotten worse and she would like to know how to proceed.  Please call back.

## 2012-04-05 NOTE — Telephone Encounter (Signed)
[name] called because she thinks her heart blockage is worse, and needs a cardiac catheterization.last office visit with Dr. Gala Romney was on 12/15/10. An appointment was made with Tereso Newcomer PA on 04/11/12. [name] aware.

## 2012-04-11 ENCOUNTER — Encounter (HOSPITAL_COMMUNITY): Payer: Self-pay | Admitting: Pharmacy Technician

## 2012-04-11 ENCOUNTER — Encounter: Payer: Self-pay | Admitting: Physician Assistant

## 2012-04-11 ENCOUNTER — Encounter: Payer: Self-pay | Admitting: *Deleted

## 2012-04-11 ENCOUNTER — Ambulatory Visit (INDEPENDENT_AMBULATORY_CARE_PROVIDER_SITE_OTHER): Payer: BC Managed Care – PPO | Admitting: Physician Assistant

## 2012-04-11 VITALS — BP 100/75 | HR 86 | Ht 65.5 in | Wt 173.0 lb

## 2012-04-11 DIAGNOSIS — I2510 Atherosclerotic heart disease of native coronary artery without angina pectoris: Secondary | ICD-10-CM

## 2012-04-11 DIAGNOSIS — R079 Chest pain, unspecified: Secondary | ICD-10-CM

## 2012-04-11 DIAGNOSIS — E785 Hyperlipidemia, unspecified: Secondary | ICD-10-CM

## 2012-04-11 DIAGNOSIS — K219 Gastro-esophageal reflux disease without esophagitis: Secondary | ICD-10-CM | POA: Insufficient documentation

## 2012-04-11 MED ORDER — NITROGLYCERIN 0.4 MG SL SUBL: 0.4000 mg | SUBLINGUAL_TABLET | SUBLINGUAL | Status: DC | PRN

## 2012-04-11 NOTE — Progress Notes (Signed)
[address] [place], [place]  [address] Phone: [phone] Fax:  [phone]  Date:  04/11/2012   Name:  [name]   DOB:  [birth year]   MRN:  [id number]  PCP:  Cassell Smiles., MD  Primary Cardiologist:  Dr. Arvilla Meres  Primary Electrophysiologist:  None    History of Present Illness: [name] is a 46 y.o. female who returns for evaluation of chest pain.  She has multiple medical problems including Addison's disease, idiopathic gastroparesis, hypothyroidism, CAD, NICM with prior EF 40-45% (EF subsequently improved), prolonged QT.   Echo 02/2006: EF 55-60%, mild MAC.   LHC 03/2007: EF 55-60%, mild inferobasal HK, LAD patent with a septal perforator with 50-60% ostial stenosis, proximal-mid RCA 50-60%.  Myoview 11/2010: No ischemia, EF 53%.  Event monitor 7/12: NSR, occ PVCs.   Last seen by Dr. Gala Romney 12/2010.  Admitted to the hospital 6/28-6/29 with chest pain.  MI ruled out with enzymes. Chest CT in June negative for pulmonary embolism. Seen in the ED 03/18/12 with chest pain.  Seen again on 7/24.  Chest x-ray 7/24 normal.  She tells me that she has been having increased amounts of chest pain for the last 2 months. It is a left chest tightness brought on by exertion which radiates to her left neck and left arm. It is associated with dyspnea, nausea and diaphoresis. She denies orthopnea, PND or edema. She describes class III dyspnea. She describes an episode of syncope about 3 weeks ago. In retrospect, it sounds as though she's describing falling asleep.  Wt Readings from Last 3 Encounters:  03/04/12 175 lb 7.8 oz (79.6 kg)  02/28/12 172 lb 9.6 oz (78.291 kg)  06/16/11 160 lb (72.576 kg)     Past Medical History  Diagnosis Date  . Cardiomyopathy     resolved  . Chest pain     chronicc  . Hyperlipidemia   . HTN (hypertension)   . Hypothyroidism   . Adrenal insufficiency   . Anxiety   . Nondiabetic gastroparesis   . CAD (coronary  artery disease)     Cath 2008 EF normal. RCA 50-60, Septal 50%. Myoview 3/12: EF 53% normal perfusion  . Chronic back pain   . D (diarrhea)   . History of CHF (congestive heart failure)   . QT prolongation   . Addison disease   . Mitral valve prolapse   . Gastroparesis   . CHF (congestive heart failure)     Current Outpatient Prescriptions  Medication Sig Dispense Refill  . levothyroxine (SYNTHROID, LEVOTHROID) 100 MCG tablet Take 100 mcg by mouth daily.       . ondansetron (ZOFRAN) 4 MG tablet Take 4-8 mg by mouth every 4 (four) hours as needed. For nausea      . aspirin 81 MG chewable tablet Chew 325 mg by mouth daily.       Marland Kitchen escitalopram (LEXAPRO) 10 MG tablet Take 10 mg by mouth every morning.      . hydrocortisone (CORTEF) 10 MG tablet Take 10-20 mg by mouth 2 (two) times daily. Take 2 tablets in the morning and 1 tablets at night usually.  Currently taking 4 tablets in the morning and 2 tablets at night due to feeling sick for 7 days. Started double dose on 03/18/12      . LORazepam (ATIVAN) 1 MG tablet Take 1 mg by mouth 4 (four) times daily as needed. For anxiety      . metoCLOPramide (  REGLAN) 10 MG tablet Take 20 mg by mouth 3 (three) times daily as needed. Runny stools      . oxyCODONE (ROXICODONE) 15 MG immediate release tablet Take 30 mg by mouth every 6 (six) hours as needed. For pain      . pantoprazole (PROTONIX) 40 MG tablet Take 40 mg by mouth daily as needed. For acid reflux      . zolpidem (AMBIEN) 10 MG tablet Take 10 mg by mouth at bedtime as needed. sleep        Allergies: Allergies  Allergen Reactions  . Bee Venom Anaphylaxis  . Penicillins Anaphylaxis    Causes anaphylaxis  . Cephalexin Diarrhea  . Doxycycline Other (See Comments)    Due to Pre-Existing conditions involved with stomach, patient does not take the following medication  . Dust Mite Extract Hives and Swelling  . Erythromycin Other (See Comments)    Due to Pre-Existing conditions involved with  stomach, patient does not take the following medication  . Morphine And Related Itching  . Sulfonamide Derivatives Itching    Causes itching   . Tramadol Nausea Only    History  Substance Use Topics  . Smoking status: Current Everyday Smoker -- 0.5 packs/day for 10 years    Types: Cigarettes  . Smokeless tobacco: Never Used   Comment: smokes 6-7 cigarettes per day  . Alcohol Use: No    Family History  Problem Relation Age of Onset  . Coronary artery disease    . Heart attack Mother   . Cancer Father     Colon     ROS:  Please see the history of present illness.    All other systems reviewed and negative.   PHYSICAL EXAM: VS:  BP 100/75  Pulse 86  Ht 5' 5.5" (1.664 m)  Wt 173 lb (78.472 kg)  BMI 28.35 kg/m2 Well nourished, well developed, in no acute distress HEENT: normal Neck: no JVD Vascular: No carotid bruits Cardiac:  normal S1, S2; RRR; no murmur Lungs:  clear to auscultation bilaterally, no wheezing, rhonchi or rales Abd: soft, nontender, no hepatomegaly Ext: no edema Skin: warm and dry Neuro:  CNs 2-12 intact, no focal abnormalities noted Psych: Normal affect  EKG:  Sinus rhythm, heart rate 86, normal axis, no acute changes, QTc 476 ms      ASSESSMENT AND PLAN:  1. Chest Pain She has typical greater than atypical symptoms. In reviewing her last catheterization, Dr. Riley Kill commented that IVUS could be considered of her RCA lesion if she had continued symptoms. She Myoview a little over a year ago. She has exertional symptoms that seem to be getting worse. I discussed her case today with Dr. Eden Emms (DOD). We will set her up with cardiac catheterization. Risks and benefits of cardiac catheterization have been discussed with the patient.  These include bleeding, infection, kidney damage, stroke, heart attack, death.  The patient understands these risks and is willing to proceed.  I will give her a Rx for NTG prn.  She knows to go to the ED if her symptoms  worsen.  2. CAD Continue aspirin. She has been intolerant to statins in the past.  3. GERD Continue PPI. If her CAD remains nonobstructive, consider followup with gastroenterology.  4. Tobacco Abuse She understands that she needs to quit.  Signed, Tereso Newcomer, PA-C  2:42 PM 04/11/2012

## 2012-04-11 NOTE — Patient Instructions (Addendum)
Your physician has requested that you have a cardiac catheterization. Cardiac catheterization is used to diagnose and/or treat various heart conditions. Doctors may recommend this procedure for a number of different reasons. The most common reason is to evaluate chest pain. Chest pain can be a symptom of coronary artery disease (CAD), and cardiac catheterization can show whether plaque is narrowing or blocking your heart's arteries. This procedure is also used to evaluate the valves, as well as measure the blood flow and oxygen levels in different parts of your heart. For further information please visit [email or web address] Please follow instruction sheet, as given.  Your physician recommends that you return for lab work in PRE CATH LABS TODAY, pt/inr,bmp,cbc/w diff  A prescription for nitroglycerin has been sent in to your pharmacy and you have been advised how and when to use ntg.

## 2012-04-11 NOTE — H&P (Signed)
History and Physical  Date:  04/11/2012   Name:  [name]   DOB:  September 10, 1965   MRN:  [id number]  PCP:  Cassell Smiles., MD  Primary Cardiologist:  Dr. Arvilla Meres  Primary Electrophysiologist:  None    History of Present Illness: [name] is a 46 y.o. female who returns for evaluation of chest pain.  She has multiple medical problems including Addison's disease, idiopathic gastroparesis, hypothyroidism, CAD, NICM with prior EF 40-45% (EF subsequently improved), prolonged QT.   Echo 02/2006: EF 55-60%, mild MAC.   LHC 03/2007: EF 55-60%, mild inferobasal HK, LAD patent with a septal perforator with 50-60% ostial stenosis, proximal-mid RCA 50-60%.  Myoview 11/2010: No ischemia, EF 53%.  Event monitor 7/12: NSR, occ PVCs.   Last seen by Dr. Gala Romney 12/2010.  Admitted to the hospital 6/28-6/29 with chest pain.  MI ruled out with enzymes. Chest CT in June negative for pulmonary embolism. Seen in the ED 03/18/12 with chest pain.  Seen again on 7/24.  Chest x-ray 7/24 normal.  She tells me that she has been having increased amounts of chest pain for the last 2 months. It is a left chest tightness brought on by exertion which radiates to her left neck and left arm. It is associated with dyspnea, nausea and diaphoresis. She denies orthopnea, PND or edema. She describes class III dyspnea. She describes an episode of syncope about 3 weeks ago. In retrospect, it sounds as though she's describing falling asleep.  Wt Readings from Last 3 Encounters:  03/04/12 175 lb 7.8 oz (79.6 kg)  02/28/12 172 lb 9.6 oz (78.291 kg)  06/16/11 160 lb (72.576 kg)     Past Medical History  Diagnosis Date  . Cardiomyopathy     resolved  . Chest pain     chronicc  . Hyperlipidemia   . HTN (hypertension)   . Hypothyroidism   . Adrenal insufficiency   . Anxiety   . Nondiabetic gastroparesis   . CAD (coronary artery disease)     Cath 2008 EF normal. RCA 50-60, Septal 50%. Myoview 3/12: EF 53% normal  perfusion  . Chronic back pain   . D (diarrhea)   . History of CHF (congestive heart failure)   . QT prolongation   . Addison disease   . Mitral valve prolapse   . Gastroparesis   . CHF (congestive heart failure)     Current Outpatient Prescriptions  Medication Sig Dispense Refill  . levothyroxine (SYNTHROID, LEVOTHROID) 100 MCG tablet Take 100 mcg by mouth daily.       . ondansetron (ZOFRAN) 4 MG tablet Take 4-8 mg by mouth every 4 (four) hours as needed. For nausea      . aspirin 81 MG chewable tablet Chew 325 mg by mouth daily.       Marland Kitchen escitalopram (LEXAPRO) 10 MG tablet Take 10 mg by mouth every morning.      . hydrocortisone (CORTEF) 10 MG tablet Take 10-20 mg by mouth 2 (two) times daily. Take 2 tablets in the morning and 1 tablets at night usually.  Currently taking 4 tablets in the morning and 2 tablets at night due to feeling sick for 7 days. Started double dose on 03/18/12      . LORazepam (ATIVAN) 1 MG tablet Take 1 mg by mouth 4 (four) times daily as needed. For anxiety      . metoCLOPramide (REGLAN) 10 MG tablet Take 20 mg by mouth 3 (three) times daily as  needed. Runny stools      . oxyCODONE (ROXICODONE) 15 MG immediate release tablet Take 30 mg by mouth every 6 (six) hours as needed. For pain      . pantoprazole (PROTONIX) 40 MG tablet Take 40 mg by mouth daily as needed. For acid reflux      . zolpidem (AMBIEN) 10 MG tablet Take 10 mg by mouth at bedtime as needed. sleep        Allergies: Allergies  Allergen Reactions  . Bee Venom Anaphylaxis  . Penicillins Anaphylaxis    Causes anaphylaxis  . Cephalexin Diarrhea  . Doxycycline Other (See Comments)    Due to Pre-Existing conditions involved with stomach, patient does not take the following medication  . Dust Mite Extract Hives and Swelling  . Erythromycin Other (See Comments)    Due to Pre-Existing conditions involved with stomach, patient does not take the following medication  . Morphine And Related Itching  .  Sulfonamide Derivatives Itching    Causes itching   . Tramadol Nausea Only    History  Substance Use Topics  . Smoking status: Current Everyday Smoker -- 0.5 packs/day for 10 years    Types: Cigarettes  . Smokeless tobacco: Never Used   Comment: smokes 6-7 cigarettes per day  . Alcohol Use: No    Family History  Problem Relation Age of Onset  . Coronary artery disease    . Heart attack Mother   . Cancer Father     Colon     ROS:  Please see the history of present illness.    All other systems reviewed and negative.   PHYSICAL EXAM: VS:  BP 100/75  Pulse 86  Ht 5' 5.5" (1.664 m)  Wt 173 lb (78.472 kg)  BMI 28.35 kg/m2 Well nourished, well developed, in no acute distress HEENT: normal Neck: no JVD Vascular: No carotid bruits Cardiac:  normal S1, S2; RRR; no murmur Lungs:  clear to auscultation bilaterally, no wheezing, rhonchi or rales Abd: soft, nontender, no hepatomegaly Ext: no edema Skin: warm and dry Neuro:  CNs 2-12 intact, no focal abnormalities noted Psych: Normal affect  EKG:  Sinus rhythm, heart rate 86, normal axis, no acute changes, QTc 476 ms      ASSESSMENT AND PLAN:  1. Chest Pain She has typical greater than atypical symptoms. In reviewing her last catheterization, Dr. Riley Kill commented that IVUS could be considered of her RCA lesion if she had continued symptoms. She Myoview a little over a year ago. She has exertional symptoms that seem to be getting worse. I discussed her case today with Dr. Eden Emms (DOD). We will set her up with cardiac catheterization. Risks and benefits of cardiac catheterization have been discussed with the patient.  These include bleeding, infection, kidney damage, stroke, heart attack, death.  The patient understands these risks and is willing to proceed.  I will give her a Rx for NTG prn.  She knows to go to the ED if her symptoms worsen.  2. CAD Continue aspirin. She has been intolerant to statins in the past.  3.  GERD Continue PPI. If her CAD remains nonobstructive, consider followup with gastroenterology.  4. Tobacco Abuse She understands that she needs to quit.  Signed, Tereso Newcomer, PA-C  2:42 PM 04/11/2012   Patient examined chart reviewed.  Agree with above findings and need for cath.  Previous cath by Dr Riley Kill and possible need for [facility] or IVUS will arrange with him  Charlton Haws

## 2012-04-11 NOTE — Addendum Note (Signed)
Addended byAlben Spittle, Lorin Picket T on: 04/11/2012 05:27 PM   Modules accepted: Orders

## 2012-04-12 ENCOUNTER — Telehealth: Payer: Self-pay | Admitting: Physician Assistant

## 2012-04-12 ENCOUNTER — Telehealth: Payer: Self-pay | Admitting: *Deleted

## 2012-04-12 LAB — BASIC METABOLIC PANEL
BUN: 9 mg/dL (ref 6–23)
Creatinine, Ser: 0.9 mg/dL (ref 0.4–1.2)
GFR: 68.34 mL/min (ref >60.00)
Glucose, Bld: 88 mg/dL (ref 70–99)
Potassium: 4.9 mEq/L (ref 3.5–5.1)

## 2012-04-12 LAB — CBC WITH DIFFERENTIAL/PLATELET
Basophils Absolute: 0 10*3/uL (ref 0.0–0.1)
HCT: 43 % (ref 36.0–46.0)
Lymphs Abs: 1.6 10*3/uL (ref 0.7–4.0)
Monocytes Absolute: 0.5 10*3/uL (ref 0.1–1.0)
Monocytes Relative: 2.6 % — ABNORMAL LOW (ref 3.0–12.0)
Neutrophils Relative %: 88.2 % — ABNORMAL HIGH (ref 43.0–77.0)
Platelets: 446 10*3/uL — ABNORMAL HIGH (ref 150.0–400.0)
RDW: 15 % — ABNORMAL HIGH (ref 11.5–14.6)
WBC: 19.4 10*3/uL (ref 4.5–10.5)

## 2012-04-12 LAB — PROTIME-INR: Prothrombin Time: 12 s (ref 10.2–12.4)

## 2012-04-12 NOTE — Telephone Encounter (Signed)
F/u   Patient calling back to f/u on status of anti biotic for upcoming cath 04/13/12, she can be reached at [phone]

## 2012-04-12 NOTE — Telephone Encounter (Signed)
msg left, pt doesn't need antibiotics- reviewed with Lorin Picket weaver PA that saw her yesterday, unsure of why she is requesting abx for LHC, left mst to call back/ see prior notes from today- numerous attempts made to speak with pt.

## 2012-04-12 NOTE — Telephone Encounter (Signed)
Please return call to patient regarding antibiotics for procedure for tomorrow 04/14/15.  She can be reached at [phone]

## 2012-04-12 NOTE — Telephone Encounter (Signed)
Called critical WBC 19.4, will forward to [facility] pac that ordered this morning.

## 2012-04-12 NOTE — Telephone Encounter (Signed)
Pt thought she needed abx for MVP and addisons, discussed with DOD Dr [place], pt does not need abx, pt verbalized understanding.

## 2012-04-12 NOTE — Telephone Encounter (Signed)
msg left to call back to review lab work/ number provided.

## 2012-04-12 NOTE — Telephone Encounter (Signed)
High white count likely related to chronic hydrocortisone use. Please make sure patient does not have any symptoms of fever, dysuria, cough. Tereso Newcomer, PA-C  10:44 AM 04/12/2012

## 2012-04-12 NOTE — Telephone Encounter (Signed)
Pt has magic jack as call handler, her number given will not accept our number.  I called number provided with personal cell and asked her to call back to [employer] number provided.

## 2012-04-13 ENCOUNTER — Ambulatory Visit (HOSPITAL_COMMUNITY)
Admission: RE | Admit: 2012-04-13 | Discharge: 2012-04-13 | Disposition: A | Discharge status: Home / Self Care | Payer: BC Managed Care – PPO | Source: Ambulatory Visit | Attending: Cardiology | Admitting: Cardiology

## 2012-04-13 ENCOUNTER — Encounter (HOSPITAL_COMMUNITY): Payer: Self-pay | Admitting: Cardiology

## 2012-04-13 ENCOUNTER — Encounter (HOSPITAL_COMMUNITY)
Admission: RE | Disposition: A | Discharge status: Home / Self Care | Payer: Self-pay | Source: Ambulatory Visit | Attending: Cardiology

## 2012-04-13 DIAGNOSIS — I428 Other cardiomyopathies: Secondary | ICD-10-CM | POA: Insufficient documentation

## 2012-04-13 DIAGNOSIS — R079 Chest pain, unspecified: Secondary | ICD-10-CM

## 2012-04-13 DIAGNOSIS — R9431 Abnormal electrocardiogram [ECG] [EKG]: Secondary | ICD-10-CM | POA: Insufficient documentation

## 2012-04-13 DIAGNOSIS — E2749 Other adrenocortical insufficiency: Secondary | ICD-10-CM | POA: Insufficient documentation

## 2012-04-13 DIAGNOSIS — E039 Hypothyroidism, unspecified: Secondary | ICD-10-CM | POA: Insufficient documentation

## 2012-04-13 DIAGNOSIS — R9439 Abnormal result of other cardiovascular function study: Secondary | ICD-10-CM | POA: Insufficient documentation

## 2012-04-13 DIAGNOSIS — I2510 Atherosclerotic heart disease of native coronary artery without angina pectoris: Secondary | ICD-10-CM | POA: Insufficient documentation

## 2012-04-13 DIAGNOSIS — K3184 Gastroparesis: Secondary | ICD-10-CM | POA: Insufficient documentation

## 2012-04-13 HISTORY — PX: LEFT HEART CATHETERIZATION WITH CORONARY ANGIOGRAM: SHX5451

## 2012-04-13 LAB — CBC
MCH: 29.5 pg (ref 26.0–34.0)
MCV: 85.2 fL (ref 78.0–100.0)
Platelets: 409 10*3/uL — ABNORMAL HIGH (ref 150–400)
RBC: 4.47 MIL/uL (ref 3.87–5.11)
RDW: 14.8 % (ref 11.5–15.5)
WBC: 10.9 10*3/uL — ABNORMAL HIGH (ref 4.0–10.5)

## 2012-04-13 SURGERY — LEFT HEART CATHETERIZATION WITH CORONARY ANGIOGRAM
Anesthesia: LOCAL

## 2012-04-13 MED ORDER — LIDOCAINE HCL (PF) 1 % IJ SOLN
INTRAMUSCULAR | Status: AC
  Filled 2012-04-13: qty 30

## 2012-04-13 MED ORDER — ONDANSETRON HCL 4 MG PO TABS: 4.0000 mg | ORAL_TABLET | ORAL | Status: DC | PRN

## 2012-04-13 MED ORDER — SODIUM CHLORIDE 0.9 % IJ SOLN: 3.0000 mL | INTRAMUSCULAR | Status: DC | PRN

## 2012-04-13 MED ORDER — OXYCODONE HCL 5 MG PO TABS
ORAL_TABLET | ORAL | Status: AC
  Filled 2012-04-13: qty 6

## 2012-04-13 MED ORDER — FENTANYL CITRATE 0.05 MG/ML IJ SOLN
INTRAMUSCULAR | Status: AC
  Filled 2012-04-13: qty 2

## 2012-04-13 MED ORDER — OXYCODONE HCL 5 MG PO TABS
30.0000 mg | ORAL_TABLET | Freq: Four times a day (QID) | ORAL | Status: DC | PRN
  Administered 2012-04-13 (×2): 30 mg via ORAL

## 2012-04-13 MED ORDER — OXYCODONE HCL 5 MG PO TABS
ORAL_TABLET | ORAL | Status: AC
  Filled 2012-04-13: qty 2

## 2012-04-13 MED ORDER — HYDROMORPHONE HCL PF 2 MG/ML IJ SOLN
2.0000 mg | Freq: Once | INTRAMUSCULAR | Status: AC
  Administered 2012-04-13: 2 mg via INTRAVENOUS

## 2012-04-13 MED ORDER — HEPARIN (PORCINE) IN NACL 2-0.9 UNIT/ML-% IJ SOLN
INTRAMUSCULAR | Status: AC
  Filled 2012-04-13: qty 2000

## 2012-04-13 MED ORDER — LEVOTHYROXINE SODIUM 100 MCG PO TABS: 100.0000 ug | ORAL_TABLET | Freq: Every day | ORAL | Status: DC

## 2012-04-13 MED ORDER — SODIUM CHLORIDE 0.9 % IV SOLN: INTRAVENOUS | Status: DC

## 2012-04-13 MED ORDER — ZOLPIDEM TARTRATE 10 MG PO TABS: 10.0000 mg | ORAL_TABLET | Freq: Every evening | ORAL | Status: DC | PRN

## 2012-04-13 MED ORDER — SODIUM CHLORIDE 0.9 % IJ SOLN: 3.0000 mL | Freq: Two times a day (BID) | INTRAMUSCULAR | Status: DC

## 2012-04-13 MED ORDER — HYDROMORPHONE HCL PF 2 MG/ML IJ SOLN: 1.0000 mg | INTRAMUSCULAR | Status: DC | PRN

## 2012-04-13 MED ORDER — ACETAMINOPHEN 325 MG PO TABS: 650.0000 mg | ORAL_TABLET | ORAL | Status: DC | PRN

## 2012-04-13 MED ORDER — ASPIRIN 81 MG PO CHEW
CHEWABLE_TABLET | ORAL | Status: AC
  Filled 2012-04-13: qty 4

## 2012-04-13 MED ORDER — SODIUM CHLORIDE 0.9 % IV SOLN
INTRAVENOUS | Status: DC
  Administered 2012-04-13: 75 mL/h via INTRAVENOUS

## 2012-04-13 MED ORDER — ASPIRIN 81 MG PO CHEW
324.0000 mg | CHEWABLE_TABLET | ORAL | Status: AC
  Administered 2012-04-13: 324 mg via ORAL

## 2012-04-13 MED ORDER — NITROGLYCERIN 0.2 MG/ML ON CALL CATH LAB
INTRAVENOUS | Status: AC
  Filled 2012-04-13: qty 1

## 2012-04-13 MED ORDER — HYDROMORPHONE HCL PF 2 MG/ML IJ SOLN
INTRAMUSCULAR | Status: AC
  Filled 2012-04-13: qty 1

## 2012-04-13 MED ORDER — HYDROCORTISONE 10 MG PO TABS: 10.0000 mg | ORAL_TABLET | Freq: Two times a day (BID) | ORAL | Status: DC

## 2012-04-13 MED ORDER — MIDAZOLAM HCL 2 MG/2ML IJ SOLN
INTRAMUSCULAR | Status: AC
  Filled 2012-04-13: qty 2

## 2012-04-13 MED ORDER — SODIUM CHLORIDE 0.9 % IV SOLN: 250.0000 mL | INTRAVENOUS | Status: DC | PRN

## 2012-04-13 MED ORDER — LORAZEPAM 1 MG PO TABS: 1.0000 mg | ORAL_TABLET | Freq: Four times a day (QID) | ORAL | Status: DC | PRN

## 2012-04-13 MED ORDER — METOCLOPRAMIDE HCL 10 MG PO TABS: 20.0000 mg | ORAL_TABLET | Freq: Three times a day (TID) | ORAL | Status: DC | PRN

## 2012-04-13 MED ORDER — PANTOPRAZOLE SODIUM 40 MG PO TBEC: 40.0000 mg | DELAYED_RELEASE_TABLET | Freq: Every day | ORAL | Status: DC

## 2012-04-13 MED ORDER — ESCITALOPRAM OXALATE 10 MG PO TABS: 10.0000 mg | ORAL_TABLET | Freq: Every morning | ORAL | Status: DC

## 2012-04-13 MED ORDER — OXYCODONE HCL 5 MG PO TABS
ORAL_TABLET | ORAL | Status: AC
  Filled 2012-04-13: qty 4

## 2012-04-13 NOTE — CV Procedure (Signed)
   Cardiac Catheterization Procedure Note  Name: [name] MRN: [id number] DOB: 05/13/1966  Procedure: Left Heart Cath, Selective Coronary Angiography, LV angiography  Indication: patient with prior cath and abnormal RCA and constant chest pain.  Seen as OP and set up for cath.     Procedural details: The right groin was prepped, draped, and anesthetized with 1% lidocaine. Using modified Seldinger technique with a smart needle, a 4 French sheath was introduced into the right femoral artery. Standard Judkins catheters were used for coronary angiography and left ventriculography. Catheter exchanges were performed over a guidewire. There were no immediate procedural complications. The patient was transferred to the post catheterization recovery area for further monitoring.  Procedural Findings: Hemodynamics:  AO 124/75 (96) LV 129/16 No gradient on pullback   Coronary angiography: Coronary dominance: right  Left mainstem: Large caliber vessel.  No obstruction.   Left anterior descending (LAD): Courses to the apex and provides a major diagonal.  No obstruction.   Left circumflex (LCx): Provides large intermediate and large posterior vessel.  No obstruction noted.    Right coronary artery (RCA): Large caliber vessel with large PDA and PLA.  No obstruction.  The PDA is smaller in caliber than the PLA.  Of note, in the prior area of narrowing in the mid RCA, there is no obstruction.  The findings favor spasm on the prior study  Left ventriculography: Left ventricular systolic function is normal, LVEF is estimated at 55-65%, there is no significant mitral regurgitation   Final Conclusions:   1.  Preserved LV function. 2.  No significant obstruction  Recommendations:  1.  DC smoking, again recommended.   2.  Medical therapy.   Shawnie Pons 04/13/2012, 10:03 AM

## 2012-04-13 NOTE — Interval H&P Note (Signed)
History and Physical Interval Note:  04/13/2012 8:42 AM  [name]  has presented today for surgery, with the diagnosis of Chest pain  The various methods of treatment have been discussed with the patient. After consideration of risks, benefits and other options for treatment, the patient has consented to  Procedure(s) (LRB): LEFT HEART CATHETERIZATION WITH CORONARY ANGIOGRAM (N/A) as a surgical intervention .  The patient's history has been reviewed, patient examined, no change in status, stable for surgery.  I have reviewed the patient's chart and labs.  Questions were answered to the patient's satisfaction.     Shawnie Pons

## 2012-04-13 NOTE — Progress Notes (Signed)
Pt up to bathroom, tolerated well.

## 2012-04-14 ENCOUNTER — Telehealth: Payer: Self-pay | Admitting: *Deleted

## 2012-04-14 NOTE — Telephone Encounter (Signed)
Message copied by Tarri Fuller on Fri Apr 14, 2012  5:11 PM ------      Message from: [place], [place] T      Created: Wed Apr 12, 2012  5:00 PM       WBC high but she is on chronic hydrocortisone with previous high WBC.      Make sure patient does not have any recent symptoms of fever, dysuria, cough, rash.        -  If so, postpone cath until seen by PCP.      Fax labs to PCP for follow up on Leukocytosis.      Other labs ok for cath.      Tereso Newcomer, PA-C  5:00 PM 04/12/2012

## 2012-04-14 NOTE — Telephone Encounter (Signed)
pt notified of lab results and gave me verbal understanding, results faxed to PCP

## 2012-04-16 ENCOUNTER — Encounter (HOSPITAL_COMMUNITY): Payer: Self-pay | Admitting: *Deleted

## 2012-04-16 ENCOUNTER — Emergency Department (HOSPITAL_COMMUNITY): Payer: BC Managed Care – PPO

## 2012-04-16 ENCOUNTER — Emergency Department (HOSPITAL_COMMUNITY)
Admission: EM | Admit: 2012-04-16 | Discharge: 2012-04-16 | Disposition: A | Discharge status: Home / Self Care | Payer: BC Managed Care – PPO | Attending: Emergency Medicine | Admitting: Emergency Medicine

## 2012-04-16 ENCOUNTER — Encounter (HOSPITAL_COMMUNITY): Payer: Self-pay | Admitting: Physical Medicine and Rehabilitation

## 2012-04-16 DIAGNOSIS — R079 Chest pain, unspecified: Secondary | ICD-10-CM | POA: Insufficient documentation

## 2012-04-16 DIAGNOSIS — R112 Nausea with vomiting, unspecified: Secondary | ICD-10-CM | POA: Insufficient documentation

## 2012-04-16 DIAGNOSIS — Z91038 Other insect allergy status: Secondary | ICD-10-CM | POA: Insufficient documentation

## 2012-04-16 DIAGNOSIS — I2510 Atherosclerotic heart disease of native coronary artery without angina pectoris: Secondary | ICD-10-CM | POA: Insufficient documentation

## 2012-04-16 DIAGNOSIS — Z7982 Long term (current) use of aspirin: Secondary | ICD-10-CM | POA: Insufficient documentation

## 2012-04-16 DIAGNOSIS — Z9089 Acquired absence of other organs: Secondary | ICD-10-CM | POA: Insufficient documentation

## 2012-04-16 DIAGNOSIS — R1084 Generalized abdominal pain: Secondary | ICD-10-CM | POA: Insufficient documentation

## 2012-04-16 DIAGNOSIS — G8929 Other chronic pain: Secondary | ICD-10-CM | POA: Insufficient documentation

## 2012-04-16 DIAGNOSIS — R51 Headache: Secondary | ICD-10-CM | POA: Insufficient documentation

## 2012-04-16 DIAGNOSIS — F17200 Nicotine dependence, unspecified, uncomplicated: Secondary | ICD-10-CM | POA: Insufficient documentation

## 2012-04-16 DIAGNOSIS — I10 Essential (primary) hypertension: Secondary | ICD-10-CM | POA: Insufficient documentation

## 2012-04-16 DIAGNOSIS — Z880 Allergy status to penicillin: Secondary | ICD-10-CM | POA: Insufficient documentation

## 2012-04-16 DIAGNOSIS — E039 Hypothyroidism, unspecified: Secondary | ICD-10-CM | POA: Insufficient documentation

## 2012-04-16 DIAGNOSIS — E785 Hyperlipidemia, unspecified: Secondary | ICD-10-CM | POA: Insufficient documentation

## 2012-04-16 DIAGNOSIS — I509 Heart failure, unspecified: Secondary | ICD-10-CM | POA: Insufficient documentation

## 2012-04-16 DIAGNOSIS — R109 Unspecified abdominal pain: Secondary | ICD-10-CM

## 2012-04-16 DIAGNOSIS — Z79899 Other long term (current) drug therapy: Secondary | ICD-10-CM | POA: Insufficient documentation

## 2012-04-16 DIAGNOSIS — E2749 Other adrenocortical insufficiency: Secondary | ICD-10-CM | POA: Insufficient documentation

## 2012-04-16 DIAGNOSIS — F411 Generalized anxiety disorder: Secondary | ICD-10-CM | POA: Insufficient documentation

## 2012-04-16 LAB — BASIC METABOLIC PANEL
CO2: 27 mEq/L (ref 19–32)
Calcium: 8.2 mg/dL — ABNORMAL LOW (ref 8.4–10.5)
Creatinine, Ser: 0.73 mg/dL (ref 0.50–1.10)
GFR calc non Af Amer: >90 mL/min (ref >90)
Glucose, Bld: 82 mg/dL (ref 70–99)
Sodium: 149 mEq/L — ABNORMAL HIGH (ref 135–145)

## 2012-04-16 LAB — CBC
Hemoglobin: 12.3 g/dL (ref 12.0–15.0)
MCH: 28.9 pg (ref 26.0–34.0)
MCHC: 33.8 g/dL (ref 30.0–36.0)
MCV: 85.4 fL (ref 78.0–100.0)
Platelets: 377 10*3/uL (ref 150–400)
Platelets: 371 10*3/uL (ref 150–400)
RBC: 4.47 MIL/uL (ref 3.87–5.11)
RDW: 14.5 % (ref 11.5–15.5)
WBC: 16.2 10*3/uL — ABNORMAL HIGH (ref 4.0–10.5)

## 2012-04-16 LAB — COMPREHENSIVE METABOLIC PANEL
ALT: 8 U/L (ref 0–35)
AST: 17 U/L (ref 0–37)
Albumin: 3.3 g/dL — ABNORMAL LOW (ref 3.5–5.2)
Chloride: 101 mEq/L (ref 96–112)
Creatinine, Ser: 0.82 mg/dL (ref 0.50–1.10)
Potassium: 2.9 mEq/L — ABNORMAL LOW (ref 3.5–5.1)
Sodium: 139 mEq/L (ref 135–145)
Total Bilirubin: 0.2 mg/dL — ABNORMAL LOW (ref 0.3–1.2)

## 2012-04-16 LAB — URINALYSIS, ROUTINE W REFLEX MICROSCOPIC
Glucose, UA: NEGATIVE mg/dL
Hgb urine dipstick: NEGATIVE
Ketones, ur: NEGATIVE mg/dL
Protein, ur: NEGATIVE mg/dL
Urobilinogen, UA: 0.2 mg/dL (ref 0.0–1.0)

## 2012-04-16 LAB — POCT I-STAT TROPONIN I

## 2012-04-16 MED ORDER — POTASSIUM CHLORIDE 20 MEQ/15ML (10%) PO LIQD
40.0000 meq | Freq: Once | ORAL | Status: AC
  Administered 2012-04-16: 40 meq via ORAL
  Filled 2012-04-16: qty 30

## 2012-04-16 MED ORDER — NITROGLYCERIN 0.4 MG SL SUBL: 0.4000 mg | SUBLINGUAL_TABLET | SUBLINGUAL | Status: DC | PRN

## 2012-04-16 MED ORDER — DICYCLOMINE HCL 10 MG PO CAPS
10.0000 mg | ORAL_CAPSULE | Freq: Once | ORAL | Status: AC
  Administered 2012-04-16: 10 mg via ORAL
  Filled 2012-04-16: qty 1

## 2012-04-16 MED ORDER — SODIUM CHLORIDE 0.9 % IV SOLN: 1000.0000 mL | Freq: Once | INTRAVENOUS | Status: DC

## 2012-04-16 MED ORDER — ASPIRIN 81 MG PO CHEW: 324.0000 mg | CHEWABLE_TABLET | Freq: Once | ORAL | Status: DC

## 2012-04-16 MED ORDER — SODIUM CHLORIDE 0.9 % IV SOLN: 1000.0000 mL | INTRAVENOUS | Status: DC

## 2012-04-16 MED ORDER — KETOROLAC TROMETHAMINE 30 MG/ML IJ SOLN
30.0000 mg | Freq: Once | INTRAMUSCULAR | Status: AC
  Administered 2012-04-16: 30 mg via INTRAVENOUS
  Filled 2012-04-16: qty 1

## 2012-04-16 MED ORDER — ONDANSETRON 4 MG PO TBDP
8.0000 mg | ORAL_TABLET | Freq: Once | ORAL | Status: AC
  Administered 2012-04-16: 8 mg via ORAL
  Filled 2012-04-16: qty 2

## 2012-04-16 NOTE — ED Notes (Signed)
Patient transported to X-ray 

## 2012-04-16 NOTE — ED Notes (Signed)
Patient has already had an aspirin in route. 324 mgs.

## 2012-04-16 NOTE — ED Notes (Signed)
IV held and Aspirin given in route.

## 2012-04-16 NOTE — ED Notes (Signed)
The pt has had a headache  Diarrhea nausea and vomiting today. Cardiac cath on Thursday.

## 2012-04-16 NOTE — ED Provider Notes (Signed)
History     CSN: [id number]  Arrival date & time 04/16/12  1432   First MD Initiated Contact with Patient 04/16/12 1433      Chief Complaint  Patient presents with  . Headache  . Chest Pain    (Consider location/radiation/quality/duration/timing/severity/associated sxs/prior treatment) Patient is a 46 y.o. female presenting with headaches and chest pain. The history is provided by the patient.  Headache  Associated symptoms include nausea.  Chest Pain Primary symptoms include nausea.   46 y/o female INAD with PMH significant for NICM, HTN, QT prolongation, anxiety, and chronic back pain c/o frontal and basilar headache x 24 hours (Pt seen and evaluated for this on the overnight shift). HA is 9 of 10 unrelieved by 30mg  oxycontin and and sternal chest pain onset this AM at 8/10. Pt has taken 3x NTG SL at home with little relief, she called 911 and they gave her ASA 325mg  and 1SL NTG. Pain is now 5/10. Pt reports nausea (which she has at her baseline secondary to gastroparesis, denies vomiting or SOB.  Pt reports blurred vision. Denies palpitations, worsening of HA with valsalva, fever  Past Medical History  Diagnosis Date  . Cardiomyopathy     resolved  . Chest pain     chronicc  . Hyperlipidemia   . HTN (hypertension)   . Hypothyroidism   . Adrenal insufficiency   . Anxiety   . Nondiabetic gastroparesis   . CAD (coronary artery disease)     Cath 2008 EF normal. RCA 50-60, Septal 50%. Myoview 3/12: EF 53% normal perfusion  . Chronic back pain   . D (diarrhea)   . History of CHF (congestive heart failure)   . QT prolongation   . Addison disease   . Mitral valve prolapse   . Gastroparesis   . CHF (congestive heart failure)     Past Surgical History  Procedure Date  . Cardiac catheterization 03/2007    showed 60% lesion in the right coronary artery  . Cholecystectomy   . Spine surgery   . Vesicovaginal fistula closure w/ tah   . Varicose vein surgery   . Abdominal  hysterectomy     Family History  Problem Relation Age of Onset  . Coronary artery disease    . Heart attack Mother   . Cancer Father     Colon    History  Substance Use Topics  . Smoking status: Current Everyday Smoker -- 0.5 packs/day for 10 years    Types: Cigarettes  . Smokeless tobacco: Never Used   Comment: smokes 6-7 cigarettes per day  . Alcohol Use: No    OB History    Grav Para Term Preterm Abortions TAB SAB Ect Mult Living                  Review of Systems  Cardiovascular: Positive for chest pain.  Gastrointestinal: Positive for nausea.  Neurological: Positive for headaches.  All other systems reviewed and are negative.    Allergies  Bee venom; Penicillins; Cephalexin; Doxycycline; Dust mite extract; Erythromycin; Morphine and related; Sulfonamide derivatives; and Tramadol  Home Medications   Current Outpatient Rx  Name Route Sig Dispense Refill  . ALBUTEROL SULFATE HFA 108 (90 BASE) MCG/ACT IN AERS Inhalation Inhale 2 puffs into the lungs every 6 (six) hours as needed. Wheezing or shortness of breath    . ASPIRIN 81 MG PO CHEW Oral Chew 81-324 mg by mouth daily. Daily dose is 1 tab, takes 4 tabs if  feeling chest pain or pressure    . ESCITALOPRAM OXALATE 10 MG PO TABS Oral Take 10 mg by mouth every morning.    Marland Kitchen HYDROCORTISONE 10 MG PO TABS Oral Take 20 mg by mouth 2 (two) times daily.     Marland Kitchen LEVOTHYROXINE SODIUM 100 MCG PO TABS Oral Take 100 mcg by mouth daily.     Marland Kitchen LORAZEPAM 1 MG PO TABS Oral Take 1 mg by mouth every 4 (four) hours. For anxiety Scheduled dose    . METOCLOPRAMIDE HCL 10 MG PO TABS Oral Take 20 mg by mouth 3 (three) times daily as needed. Runny stools    . NITROGLYCERIN 0.4 MG SL SUBL Sublingual Place 1 tablet (0.4 mg total) under the tongue every 5 (five) minutes as needed for chest pain. 25 tablet 3  . ONDANSETRON HCL 4 MG PO TABS Oral Take 4-8 mg by mouth every 4 (four) hours as needed. For nausea    . OXYCODONE HCL 15 MG PO TABS Oral  Take 30 mg by mouth every 6 (six) hours as needed. For pain    . PANTOPRAZOLE SODIUM 40 MG PO TBEC Oral Take 40 mg by mouth daily as needed. For acid reflux    . ZOLPIDEM TARTRATE 10 MG PO TABS Oral Take 10 mg by mouth at bedtime as needed. sleep      BP 129/74  Pulse 77  Temp 98.3 F (36.8 C) (Oral)  Resp 17  SpO2 94%  Physical Exam  Nursing note and vitals reviewed. Constitutional: She is oriented to person, place, and time. She appears well-developed and well-nourished. No distress.  HENT:  Head: Normocephalic and atraumatic.  Mouth/Throat: Oropharynx is clear and moist.  Eyes: Conjunctivae and EOM are normal. Pupils are equal, round, and reactive to light.  Neck: Normal range of motion. Neck supple. No JVD present.  Cardiovascular: Normal rate, regular rhythm, normal heart sounds and intact distal pulses.  Exam reveals no gallop and no friction rub.   No murmur heard. Pulmonary/Chest: Effort normal and breath sounds normal. No stridor. No respiratory distress. She has no wheezes.  Abdominal: Soft. Bowel sounds are normal. She exhibits no distension and no mass. There is no tenderness. There is no rebound and no guarding.  Musculoskeletal: Normal range of motion.  Neurological: She is alert and oriented to person, place, and time.       Cranial nerves 3 through 12 intact. strength 5 out of 5x4 extremities. No slurred speech. finger to nose and heel to shin coordinated, also patient's gait is coordinated.  Skin: Skin is dry.  Psychiatric: She has a normal mood and affect.    ED Course  Procedures (including critical care time)  Labs Reviewed  CBC - Abnormal; Notable for the following:    WBC 16.2 (*)     All other components within normal limits  COMPREHENSIVE METABOLIC PANEL - Abnormal; Notable for the following:    Potassium 2.9 (*)  DELTA CHECK NOTED   Glucose, Bld 104 (*)     BUN 5 (*)     Albumin 3.3 (*)     Total Bilirubin 0.2 (*)     GFR calc non Af Amer 83 (*)       All other components within normal limits  POCT I-STAT TROPONIN I  URINALYSIS, ROUTINE W REFLEX MICROSCOPIC   Ct Head Wo Contrast  04/16/2012  *RADIOLOGY REPORT*  Clinical Data: Nausea and vomiting.  Headache.  CT HEAD WITHOUT CONTRAST  Technique:  Contiguous axial  images were obtained from the base of the skull through the vertex without contrast.  Comparison: 06/29/2010 and 02/12/2010  Findings: The brain stem, cerebellum, cerebral peduncles, thalami, basal ganglia, basilar cisterns, and ventricular system appear unremarkable.  No intracranial hemorrhage, mass lesion, or acute infarction is identified.  The visualized paranasal sinuses appear clear.  IMPRESSION:  No significant abnormality identified.  Original Report Authenticated By: Dellia Cloud, M.D.   Dg Chest Portable 1 View  04/16/2012  *RADIOLOGY REPORT*  Clinical Data: Chest pain.  History of Addison's disease.  PORTABLE CHEST - 1 VIEW 04/16/2012 1513 hours:  Comparison: Two-view chest x-ray 03/29/2012, 03/18/2012.  CTA chest 03/03/2012.  Findings: Cardiac silhouette normal and mediastinal contours unremarkable for the AP portable technique.  Lungs clear. Pulmonary vascularity normal.  Bronchovascular markings normal.  No pneumothorax.  No pleural effusions.  IMPRESSION: No acute cardiopulmonary disease.  Original Report Authenticated By: Arnell Sieving, M.D.     No diagnosis found.    MDM  Pt with HA and CP. Patient has a nonfocal neurological exam and head CT reveals no abnormality. Patient's EKG is normal and troponins are also negative.   Date: 04/16/2012  Rate:72  Rhythm: normal sinus rhythm  QRS Axis: normal  Intervals: normal  ST/T Wave abnormalities: normal  Conduction Disutrbances:none  Narrative Interpretation:   Old EKG Reviewed: unchanged  Cardiac catheterization performed by Dr. Riley Kill on August 8 shows no left ventricular dysfunction with no occlusion to any cardiac vessel.  The patient found to  be mildly hypokalemic at 2.9, I will replete with 40 mg by mouth. Patient also has a slightly elevated white cell count of 16.2 which may be from the stress of the cardiac catheterization several days ago. She is afebrile.  Patient has stable vital signs and is safe for DC to home.  Pt verbalized understanding and agrees with care plan. Outpatient follow-up and return precautions given.         Wynetta Emery, PA-C 04/16/12 1630

## 2012-04-16 NOTE — ED Provider Notes (Signed)
History     CSN: [id number]  Arrival date & time 04/16/12  0054   First MD Initiated Contact with Patient 04/16/12 0127      Chief Complaint  Patient presents with  . Headache    (Consider location/radiation/quality/duration/timing/severity/associated sxs/prior treatment) HPI Comments: Patient with multiple medical problems including Addison's disease, idiopathic gastroparesis, hypothyroidism, CAD, NICM with prior EF 40-45% (EF subsequently improved), prolonged QT,  Presents to the emergency department with multiple complaints.  She states that she had a cardiac cath performed on Thursday and was discharged from hospital.  She was asymptomatic Friday and then Saturday she developed continuous diarrhea, nausea, and vomiting.  Today she developed a headache.  She denies any localized abdominal pain but states she does have diffuse abdominal cramping that has been constant since Saturday.  She denies any chest pain, shortness of breath, leg swelling, fever, night sweats, chills, cold or heat intolerance, syncope, lethargy, mental status change   Echo 02/2006: EF 55-60%, mild MAC.    LHC 03/2007: EF 55-60%, mild inferobasal HK, LAD patent with a septal perforator with 50-60% ostial stenosis, proximal-mid RCA 50-60%.  Myoview 11/2010: No ischemia, EF 53%.  Event monitor 7/12: NSR, occ PVCs.   Cardiac cath:Thomas Stuckey 04/13/2012, 10:03 AM  Final Conclusions:   1.  Preserved LV function. 2.  No significant obstruction      Patient is a 46 y.o. female presenting with headaches. The history is provided by the patient.  Headache  Associated symptoms include nausea and vomiting. Pertinent negatives include no fever.    Past Medical History  Diagnosis Date  . Cardiomyopathy     resolved  . Chest pain     chronicc  . Hyperlipidemia   . HTN (hypertension)   . Hypothyroidism   . Adrenal insufficiency   . Anxiety   . Nondiabetic gastroparesis   . CAD (coronary artery disease)     Cath  2008 EF normal. RCA 50-60, Septal 50%. Myoview 3/12: EF 53% normal perfusion  . Chronic back pain   . D (diarrhea)   . History of CHF (congestive heart failure)   . QT prolongation   . Addison disease   . Mitral valve prolapse   . Gastroparesis   . CHF (congestive heart failure)     Past Surgical History  Procedure Date  . Cardiac catheterization 03/2007    showed 60% lesion in the right coronary artery  . Cholecystectomy   . Spine surgery   . Vesicovaginal fistula closure w/ tah   . Varicose vein surgery   . Abdominal hysterectomy     Family History  Problem Relation Age of Onset  . Coronary artery disease    . Heart attack Mother   . Cancer Father     Colon    History  Substance Use Topics  . Smoking status: Current Everyday Smoker -- 0.5 packs/day for 10 years    Types: Cigarettes  . Smokeless tobacco: Never Used   Comment: smokes 6-7 cigarettes per day  . Alcohol Use: No    OB History    Grav Para Term Preterm Abortions TAB SAB Ect Mult Living                  Review of Systems  Constitutional: Negative for fever, diaphoresis and activity change.  HENT: Negative for congestion and neck pain.   Respiratory: Negative for cough.   Gastrointestinal: Positive for nausea, vomiting, abdominal pain and diarrhea. Negative for constipation, blood in stool, abdominal  distention, anal bleeding and rectal pain.  Genitourinary: Negative for dysuria.  Musculoskeletal: Negative for myalgias.  Skin: Negative for color change and wound.  Neurological: Positive for headaches. Negative for dizziness, seizures, syncope, facial asymmetry, speech difficulty, weakness, light-headedness and numbness.  All other systems reviewed and are negative.    Allergies  Bee venom; Penicillins; Cephalexin; Doxycycline; Dust mite extract; Erythromycin; Morphine and related; Sulfonamide derivatives; and Tramadol  Home Medications   Current Outpatient Rx  Name Route Sig Dispense Refill    . ALBUTEROL SULFATE HFA 108 (90 BASE) MCG/ACT IN AERS Inhalation Inhale 2 puffs into the lungs every 6 (six) hours as needed. Wheezing or shortness of breath    . ASPIRIN 81 MG PO CHEW Oral Chew 81-324 mg by mouth daily. Daily dose is 1 tab, takes 4 tabs if feeling chest pain or pressure    . ESCITALOPRAM OXALATE 10 MG PO TABS Oral Take 10 mg by mouth every morning.    Marland Kitchen HYDROCORTISONE 10 MG PO TABS Oral Take 20 mg by mouth 2 (two) times daily. Take 2 tabs bid    . LEVOTHYROXINE SODIUM 100 MCG PO TABS Oral Take 100 mcg by mouth daily.     Marland Kitchen LORAZEPAM 1 MG PO TABS Oral Take 1 mg by mouth every 4 (four) hours. For anxiety Scheduled dose    . METOCLOPRAMIDE HCL 10 MG PO TABS Oral Take 20 mg by mouth 3 (three) times daily as needed. Runny stools    . NITROGLYCERIN 0.4 MG SL SUBL Sublingual Place 1 tablet (0.4 mg total) under the tongue every 5 (five) minutes as needed for chest pain. 25 tablet 3  . ONDANSETRON HCL 4 MG PO TABS Oral Take 4-8 mg by mouth every 4 (four) hours as needed. For nausea    . OXYCODONE HCL 15 MG PO TABS Oral Take 30 mg by mouth every 6 (six) hours as needed. For pain    . PANTOPRAZOLE SODIUM 40 MG PO TBEC Oral Take 40 mg by mouth daily as needed. For acid reflux    . ZOLPIDEM TARTRATE 10 MG PO TABS Oral Take 10 mg by mouth at bedtime as needed. sleep      BP 152/67  Pulse 79  Temp 98.2 F (36.8 C) (Oral)  Resp 16  SpO2 95%  Physical Exam  Nursing note and vitals reviewed. Constitutional: She is oriented to person, place, and time. She appears well-developed and well-nourished. No distress.  HENT:  Head: Normocephalic and atraumatic.  Eyes: Conjunctivae and EOM are normal.  Neck: Normal range of motion.  Cardiovascular: Normal rate, regular rhythm, normal heart sounds and intact distal pulses.   Pulmonary/Chest: Effort normal and breath sounds normal. No respiratory distress.  Abdominal: Bowel sounds are normal.       Soft obese abdomen, diffuse abdominal ttp, no  localized pain. No peritoneal signs.   Musculoskeletal: Normal range of motion. She exhibits no tenderness.  Neurological: She is alert and oriented to person, place, and time.       CN III-XII intact, good coordination, nl gait, strength 5/5 bilaterally   Skin: Skin is warm and dry. No rash noted. She is not diaphoretic.  Psychiatric: She has a normal mood and affect. Her behavior is normal.    ED Course  Procedures (including critical care time)  Labs Reviewed - No data to display No results found.   No diagnosis found.   Date: 04/16/2012  Rate: 77  Rhythm: normal sinus rhythm  QRS Axis: normal  Intervals: normal  ST/T Wave abnormalities: normal  Conduction Disutrbances: none  Narrative Interpretation:   Old EKG Reviewed: No significant changes noted     MDM  Diffuse abdominal pain, N/V/D  Vitals are stable, no fever. No focal neuro deficits on exam, HA non-concerning for emergent work up.  No signs of dehydration, tolerating PO fluids > 6 oz.  Lungs are clear.  No focal abdominal pain, no concern for appendicitis, cholecystitis, pancreatitis, ruptured viscus, UTI, kidney stone, or any other abdominal etiology.  Supportive therapy indicated with return if symptoms worsen.  Patient counseled.         Jaci Carrel, [place] 04/18/12 [phone]

## 2012-04-16 NOTE — ED Notes (Signed)
Complaints of nausea without vomiting, and headache  Pain since last pm. Patient was seen last pm for headache and nausea and she returns today for the same.

## 2012-04-16 NOTE — ED Notes (Signed)
Pt presents to department for evaluation of headache and midsternal chest pain. Onset at 13:00. Pain radiates to back, rating 6/10 upon arrival to ED. Also states nausea. 20g L forearm. Received 324 ASA and 1 sublingual nitro. Pt is alert and oriented x4.

## 2012-04-16 NOTE — ED Notes (Signed)
Patient discharged with instructions with family, using teach back method Ambulatory and NAD noted

## 2012-04-17 NOTE — ED Provider Notes (Signed)
Medical screening examination/treatment/procedure(s) were performed by non-physician practitioner and as supervising physician I was immediately available for consultation/collaboration.  Cheri Guppy, MD 04/17/12 [phone]

## 2012-04-21 NOTE — ED Provider Notes (Signed)
Medical screening examination/treatment/procedure(s) were performed by non-physician practitioner and as supervising physician I was immediately available for consultation/collaboration.  Sunnie Nielsen, MD 04/21/12 [phone]

## 2012-07-11 IMAGING — CR DG CHEST 2V
2 series · 2 of 2 positions shown · non-contrast
Comparison: 03/25/2011

CLINICAL DATA: Nausea.  Vomiting.  Diarrhea.  Cough.

CHEST - 2 VIEW

[w chest pa]
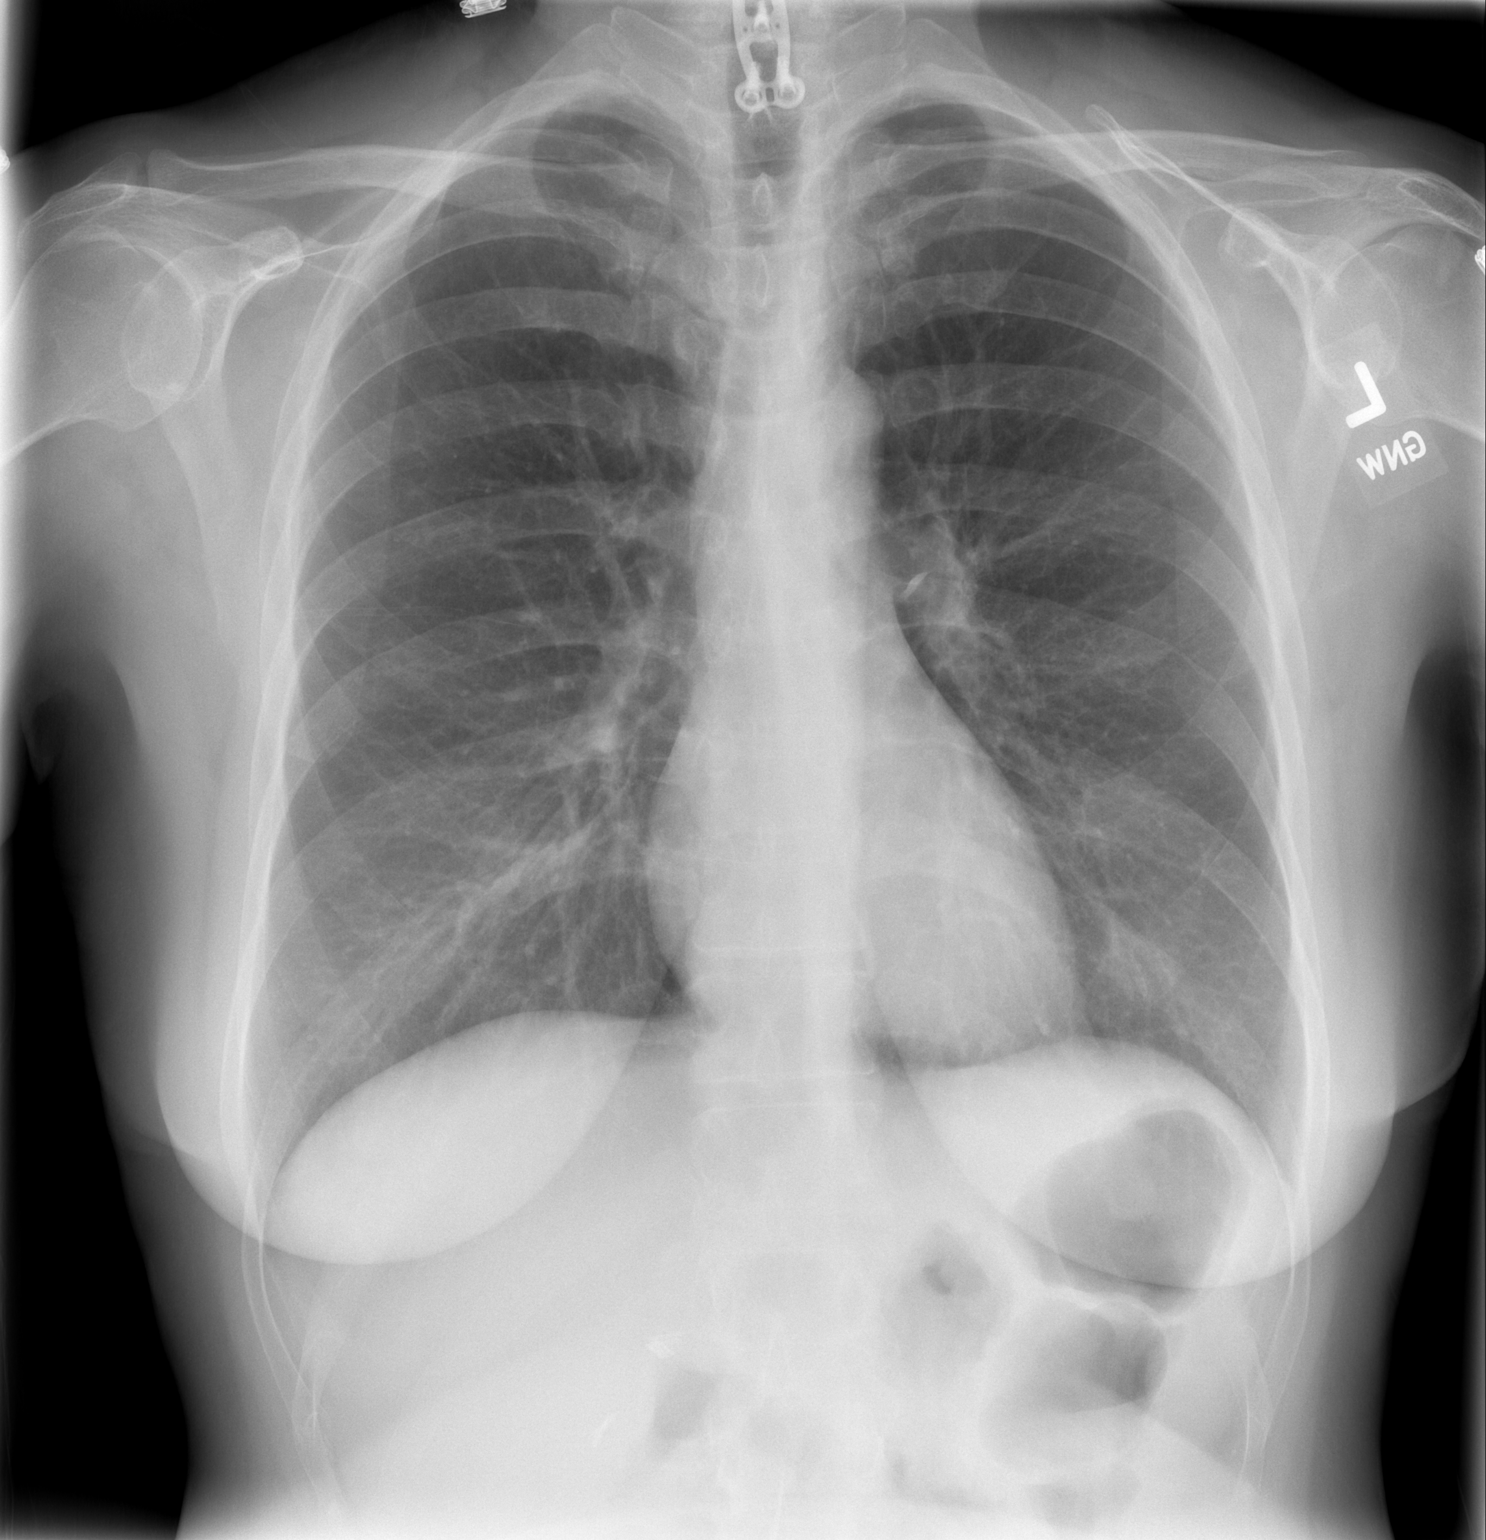

[w chest lat]
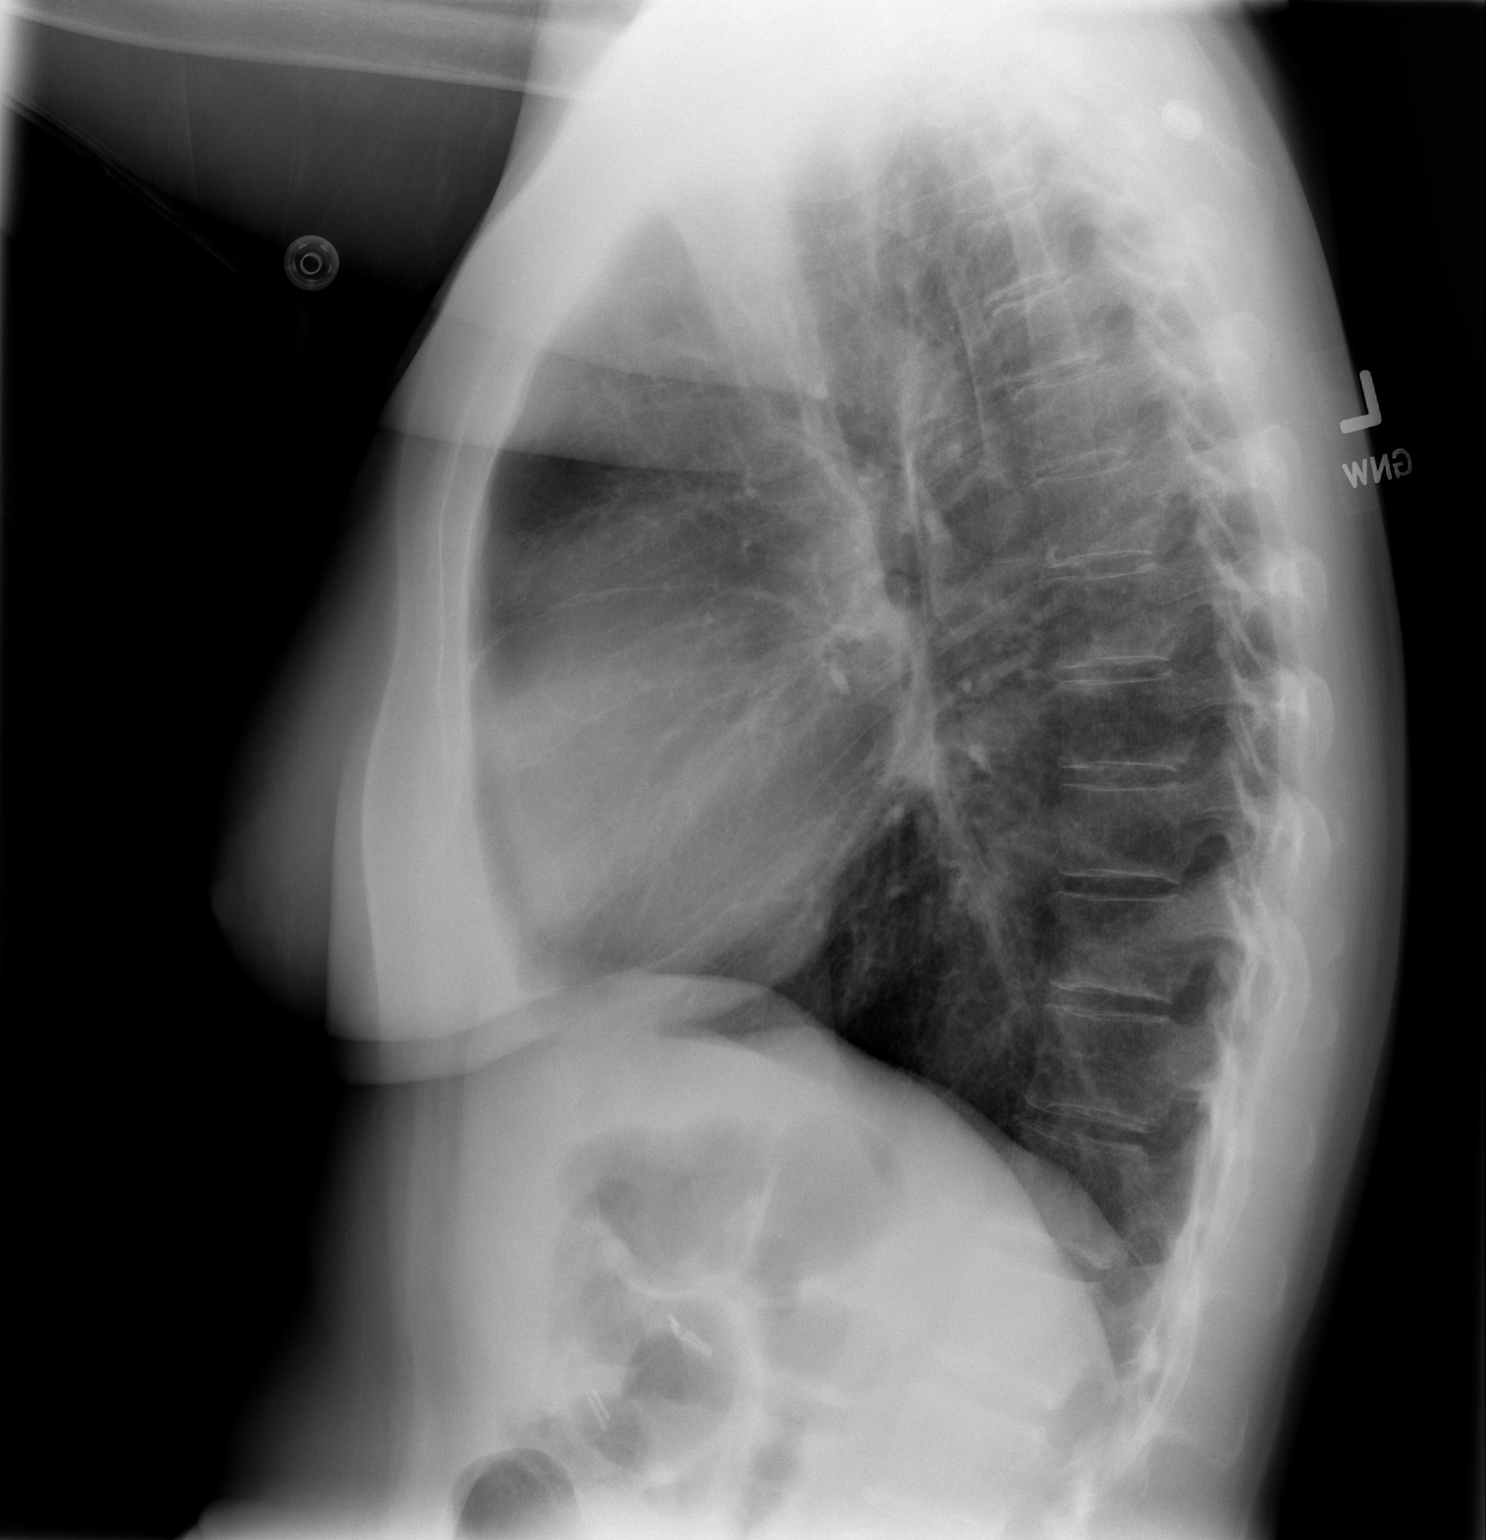

[2 of 2 positions shown; findings below may reference images not displayed]

FINDINGS: Cardiac and mediastinal contours appear normal.

The lungs appear clear.

No pleural effusion is identified.

Lower cervical plate and screw fixator noted.
IMPRESSION: 1.  No significant abnormality identified.

## 2012-09-06 ENCOUNTER — Emergency Department (HOSPITAL_COMMUNITY)
Admission: EM | Admit: 2012-09-06 | Discharge: 2012-09-06 | Disposition: A | Discharge status: Home / Self Care | Payer: Medicaid Other | Attending: Emergency Medicine | Admitting: Emergency Medicine

## 2012-09-06 ENCOUNTER — Encounter (HOSPITAL_COMMUNITY): Payer: Self-pay | Admitting: *Deleted

## 2012-09-06 DIAGNOSIS — E039 Hypothyroidism, unspecified: Secondary | ICD-10-CM | POA: Insufficient documentation

## 2012-09-06 DIAGNOSIS — E785 Hyperlipidemia, unspecified: Secondary | ICD-10-CM | POA: Insufficient documentation

## 2012-09-06 DIAGNOSIS — Z862 Personal history of diseases of the blood and blood-forming organs and certain disorders involving the immune mechanism: Secondary | ICD-10-CM | POA: Insufficient documentation

## 2012-09-06 DIAGNOSIS — E2749 Other adrenocortical insufficiency: Secondary | ICD-10-CM | POA: Insufficient documentation

## 2012-09-06 DIAGNOSIS — Z8719 Personal history of other diseases of the digestive system: Secondary | ICD-10-CM | POA: Insufficient documentation

## 2012-09-06 DIAGNOSIS — M545 Low back pain, unspecified: Secondary | ICD-10-CM | POA: Insufficient documentation

## 2012-09-06 DIAGNOSIS — Z8679 Personal history of other diseases of the circulatory system: Secondary | ICD-10-CM | POA: Insufficient documentation

## 2012-09-06 DIAGNOSIS — M549 Dorsalgia, unspecified: Secondary | ICD-10-CM

## 2012-09-06 DIAGNOSIS — I2510 Atherosclerotic heart disease of native coronary artery without angina pectoris: Secondary | ICD-10-CM | POA: Insufficient documentation

## 2012-09-06 DIAGNOSIS — Z7982 Long term (current) use of aspirin: Secondary | ICD-10-CM | POA: Insufficient documentation

## 2012-09-06 DIAGNOSIS — F411 Generalized anxiety disorder: Secondary | ICD-10-CM | POA: Insufficient documentation

## 2012-09-06 DIAGNOSIS — R079 Chest pain, unspecified: Secondary | ICD-10-CM | POA: Insufficient documentation

## 2012-09-06 DIAGNOSIS — R52 Pain, unspecified: Secondary | ICD-10-CM | POA: Insufficient documentation

## 2012-09-06 DIAGNOSIS — R5381 Other malaise: Secondary | ICD-10-CM | POA: Insufficient documentation

## 2012-09-06 DIAGNOSIS — Z90710 Acquired absence of both cervix and uterus: Secondary | ICD-10-CM | POA: Insufficient documentation

## 2012-09-06 DIAGNOSIS — Z8639 Personal history of other endocrine, nutritional and metabolic disease: Secondary | ICD-10-CM | POA: Insufficient documentation

## 2012-09-06 DIAGNOSIS — Z79899 Other long term (current) drug therapy: Secondary | ICD-10-CM | POA: Insufficient documentation

## 2012-09-06 DIAGNOSIS — G8929 Other chronic pain: Secondary | ICD-10-CM | POA: Insufficient documentation

## 2012-09-06 DIAGNOSIS — Z9861 Coronary angioplasty status: Secondary | ICD-10-CM | POA: Insufficient documentation

## 2012-09-06 DIAGNOSIS — F17200 Nicotine dependence, unspecified, uncomplicated: Secondary | ICD-10-CM | POA: Insufficient documentation

## 2012-09-06 DIAGNOSIS — I10 Essential (primary) hypertension: Secondary | ICD-10-CM | POA: Insufficient documentation

## 2012-09-06 DIAGNOSIS — I509 Heart failure, unspecified: Secondary | ICD-10-CM | POA: Insufficient documentation

## 2012-09-06 DIAGNOSIS — Z9889 Other specified postprocedural states: Secondary | ICD-10-CM | POA: Insufficient documentation

## 2012-09-06 MED ORDER — ONDANSETRON 4 MG PO TBDP: 8.0000 mg | ORAL_TABLET | Freq: Once | ORAL | Status: DC

## 2012-09-06 MED ORDER — DEXAMETHASONE SODIUM PHOSPHATE 10 MG/ML IJ SOLN
10.0000 mg | Freq: Once | INTRAMUSCULAR | Status: DC
  Filled 2012-09-06: qty 1

## 2012-09-06 MED ORDER — DIAZEPAM 5 MG PO TABS
5.0000 mg | ORAL_TABLET | Freq: Once | ORAL | Status: AC
  Administered 2012-09-06: 5 mg via ORAL
  Filled 2012-09-06: qty 1

## 2012-09-06 MED ORDER — PREDNISONE 20 MG PO TABS: 40.0000 mg | ORAL_TABLET | Freq: Every day | ORAL | Status: DC

## 2012-09-06 MED ORDER — KETOROLAC TROMETHAMINE 30 MG/ML IJ SOLN
30.0000 mg | Freq: Once | INTRAMUSCULAR | Status: AC
  Administered 2012-09-06: 30 mg via INTRAMUSCULAR
  Filled 2012-09-06: qty 1

## 2012-09-06 MED ORDER — HYDROMORPHONE HCL PF 2 MG/ML IJ SOLN
2.0000 mg | Freq: Once | INTRAMUSCULAR | Status: AC
  Administered 2012-09-06: 2 mg via INTRAMUSCULAR

## 2012-09-06 MED ORDER — HYDROMORPHONE HCL PF 2 MG/ML IJ SOLN
2.0000 mg | Freq: Once | INTRAMUSCULAR | Status: AC
  Administered 2012-09-06: 2 mg via INTRAMUSCULAR
  Filled 2012-09-06: qty 1

## 2012-09-06 MED ORDER — HYDROMORPHONE HCL PF 2 MG/ML IJ SOLN
2.0000 mg | Freq: Once | INTRAMUSCULAR | Status: DC
  Filled 2012-09-06: qty 1

## 2012-09-06 MED ORDER — DEXAMETHASONE SODIUM PHOSPHATE 10 MG/ML IJ SOLN
10.0000 mg | Freq: Once | INTRAMUSCULAR | Status: AC
  Administered 2012-09-06: 10 mg via INTRAMUSCULAR

## 2012-09-06 NOTE — ED Notes (Signed)
Now radiates to right side and down both legs. Pt states that condition is chronic in nature. Condition is worse with "movement" and "trying to stand" condition is made better with "res" no movement" pt has a hx of chronic back pain. Pt states that she did not take any pain medication today.

## 2012-09-06 NOTE — ED Provider Notes (Signed)
History     CSN: [id number]  Arrival date & time 09/06/12  1040   First MD Initiated Contact with Patient 09/06/12 1116      Chief Complaint  Patient presents with  . Back Pain    (Consider location/radiation/quality/duration/timing/severity/associated sxs/prior treatment) HPI Comments: Patient with history of chronic back pain presents with worsening lower back pain that radiates into her bilateral thighs that became worse this morning. Patient denies injuries. Pain is described as shooting down the sides of her legs into the front of her thighs. Patient states that she was able to get up and walk and had to crawl to her daughter from this morning. She denies unexplained fever or weight loss. No history of cancer, IV drug use. No urinary retention or incontinence. No fecal incontinence.  Patient is prescribed 30 mg oxycodone tablets by her primary care physician for chronic back pain. She last took her medication at approximately 6 AM. Onset acute on chronic. Course is constant. Movement makes the pain worse. Nothing makes it better.  The history is provided by the patient.    Past Medical History  Diagnosis Date  . Cardiomyopathy     resolved  . Chest pain     chronicc  . Hyperlipidemia   . HTN (hypertension)   . Hypothyroidism   . Adrenal insufficiency   . Anxiety   . Nondiabetic gastroparesis   . CAD (coronary artery disease)     Cath 2008 EF normal. RCA 50-60, Septal 50%. Myoview 3/12: EF 53% normal perfusion  . Chronic back pain   . D (diarrhea)   . History of CHF (congestive heart failure)   . QT prolongation   . Addison disease   . Mitral valve prolapse   . Gastroparesis   . CHF (congestive heart failure)     Past Surgical History  Procedure Date  . Cardiac catheterization 03/2007    showed 60% lesion in the right coronary artery  . Cholecystectomy   . Spine surgery   . Vesicovaginal fistula closure w/ tah   . Varicose vein surgery   . Abdominal hysterectomy      Family History  Problem Relation Age of Onset  . Coronary artery disease    . Heart attack Mother   . Cancer Father     Colon    History  Substance Use Topics  . Smoking status: Current Every Day Smoker -- 0.5 packs/day for 10 years    Types: Cigarettes  . Smokeless tobacco: Never Used     Comment: smokes 6-7 cigarettes per day  . Alcohol Use: No    OB History    Grav Para Term Preterm Abortions TAB SAB Ect Mult Living                  Review of Systems  Constitutional: Negative for fever and unexpected weight change.  Gastrointestinal: Negative for constipation.       Negative for fecal incontinence.   Genitourinary: Negative for dysuria, hematuria, flank pain, vaginal bleeding, vaginal discharge and pelvic pain.       Negative for urinary incontinence or retention.  Musculoskeletal: Positive for back pain.  Neurological: Positive for weakness. Negative for numbness.       Denies saddle paresthesias.    Allergies  Bee venom; Penicillins; Cephalexin; Doxycycline; Dust mite extract; Erythromycin; Morphine and related; Sulfonamide derivatives; and Tramadol  Home Medications   Current Outpatient Rx  Name  Route  Sig  Dispense  Refill  . ALBUTEROL  SULFATE HFA 108 (90 BASE) MCG/ACT IN AERS   Inhalation   Inhale 2 puffs into the lungs every 6 (six) hours as needed. Wheezing or shortness of breath         . ASPIRIN 81 MG PO CHEW   Oral   Chew 81-324 mg by mouth daily. Daily dose is 1 tab, takes 4 tabs if feeling chest pain or pressure         . HYDROCORTISONE 10 MG PO TABS   Oral   Take 20 mg by mouth 2 (two) times daily.          Marland Kitchen LEVOTHYROXINE SODIUM 100 MCG PO TABS   Oral   Take 100 mcg by mouth daily.          Marland Kitchen LORAZEPAM 1 MG PO TABS   Oral   Take 1 mg by mouth every 4 (four) hours. For anxiety Scheduled dose         . METOCLOPRAMIDE HCL 10 MG PO TABS   Oral   Take 20 mg by mouth 3 (three) times daily as needed. Runny stools           . MIRTAZAPINE 15 MG PO TABS   Oral   Take 15 mg by mouth at bedtime.         Marland Kitchen ONDANSETRON HCL 4 MG PO TABS   Oral   Take 4-8 mg by mouth every 4 (four) hours as needed. For nausea         . OXYCODONE HCL 30 MG PO TABS   Oral   Take 30 mg by mouth every 4 (four) hours as needed. For pain         . PANTOPRAZOLE SODIUM 40 MG PO TBEC   Oral   Take 40 mg by mouth daily as needed. For acid reflux         . NITROGLYCERIN 0.4 MG SL SUBL   Sublingual   Place 1 tablet (0.4 mg total) under the tongue every 5 (five) minutes as needed for chest pain.   25 tablet   3     BP 136/76  Pulse 110  Temp 98.7 F (37.1 C)  Resp 16  SpO2 99%  Physical Exam  Nursing note and vitals reviewed. Constitutional: She appears well-developed and well-nourished.  HENT:  Head: Normocephalic and atraumatic.  Eyes: Conjunctivae normal are normal.  Neck: Normal range of motion. Neck supple.  Pulmonary/Chest: Effort normal.  Abdominal: Soft. There is no tenderness. There is no CVA tenderness.  Musculoskeletal: Normal range of motion.       Cervical back: She exhibits no bony tenderness.       Thoracic back: She exhibits no bony tenderness.       Lumbar back: She exhibits tenderness. She exhibits no bony tenderness.       Back:       No step-off noted with palpation of spine.   Neurological: She is alert. She has normal strength and normal reflexes. No sensory deficit.       5/5 strength in bilateral ankles and knees although generally poor effort during exam. Patient states that she is unable to lift legs off the bed or hold them above bed. No sensation deficit.   Skin: Skin is warm and dry. No rash noted.  Psychiatric: She has a normal mood and affect.    ED Course  Procedures (including critical care time)  Labs Reviewed - No data to display No results found.   1.  Back pain     11:18 AM MR lumbar spine (03/2011): 1. Little interval change in disc degeneration with left eccentric  disc protrusions at L3-L4 and L4-L5 affecting the left lateral recesses and left L3 and L4 foramen as detailed above. 2. No lumbar spinal stenosis. No new disc herniation.  Oxycodone 30mg  #180 filled 08/03/12  11:32 AM Patient seen and examined. Medications ordered. No red flags, will attempt to control symptoms.   Vital signs reviewed and are as follows: Filed Vitals:   09/06/12 1047  BP: 136/76  Pulse: 110  Temp: 98.7 F (37.1 C)  Resp: 16   2:07 PM Patient was ambulated with assistance.   2:59 PM Patient was given toradol and decadron prior to d/c. She was moving much more easily after discharge plans were made and was up in room.   D/c to home with 5 days of prednisone and home pain medications.   No red flag s/s of low back pain. Patient was counseled on back pain precautions and told to do activity as tolerated but do not lift, push, or pull heavy objects more than 10 pounds for the next week.  Patient counseled to use ice or heat on back for no longer than 15 minutes every hour.   Patient urged to follow-up with PCP if pain does not improve with treatment and rest or if pain becomes recurrent. Urged to return with worsening severe pain, loss of bowel or bladder control, trouble walking.   The patient verbalizes understanding and agrees with the plan.    MDM  Patient with acute on chronic back pain. No gross neurological deficits. Patient is ambulatory. No warning symptoms of back pain including: loss of bowel or bladder control, night sweats, waking from sleep with back pain, unexplained fevers or weight loss, h/o cancer, IVDU, recent trauma. No concern for cauda equina, epidural abscess, or other serious cause of back pain. Conservative measures such as rest, ice/heat and pain medicine indicated with PCP follow-up if no improvement with conservative management. No indication for emergent MRI.          Renne Crigler, [place] 09/06/12 [phone]

## 2012-09-06 NOTE — ED Notes (Signed)
Patient states she can't walk. Said she crawled to the wheelchair and her daughter brought her here. Says she hasn't walked at all today.

## 2012-09-06 NOTE — ED Notes (Signed)
PA at bedside.

## 2012-09-06 NOTE — ED Notes (Signed)
ED PA made aware pt requesting pain medication at this time

## 2012-09-06 NOTE — ED Notes (Signed)
Pt initially refused to ambulate. Pt agreed to ambulate with 2 staff members. Pt reports pain to her lower back radiating down. Pt states that she is unable to bend down

## 2012-09-06 NOTE — ED Notes (Addendum)
To ED for further eval of back pain. States she has chronic back pain but pain is worse than ever. Pain radiates down both legs. States she has to crawl due to pain rather than walk. Denies injury. Denies difficulty with urination or defecation. Also denies incontinence

## 2012-09-06 NOTE — ED Provider Notes (Signed)
Medical screening examination/treatment/procedure(s) were performed by non-physician practitioner and as supervising physician I was immediately available for consultation/collaboration.    Nelia Shi, MD 09/06/12 2229

## 2012-09-20 ENCOUNTER — Emergency Department (HOSPITAL_COMMUNITY)
Admission: EM | Admit: 2012-09-20 | Discharge: 2012-09-20 | Discharge status: Against Medical Advice | Payer: Medicaid Other | Attending: Emergency Medicine | Admitting: Emergency Medicine

## 2012-09-20 ENCOUNTER — Encounter (HOSPITAL_COMMUNITY): Payer: Self-pay | Admitting: *Deleted

## 2012-09-20 ENCOUNTER — Emergency Department (HOSPITAL_COMMUNITY): Payer: Medicaid Other

## 2012-09-20 DIAGNOSIS — Z862 Personal history of diseases of the blood and blood-forming organs and certain disorders involving the immune mechanism: Secondary | ICD-10-CM | POA: Insufficient documentation

## 2012-09-20 DIAGNOSIS — Z8679 Personal history of other diseases of the circulatory system: Secondary | ICD-10-CM | POA: Insufficient documentation

## 2012-09-20 DIAGNOSIS — E039 Hypothyroidism, unspecified: Secondary | ICD-10-CM | POA: Insufficient documentation

## 2012-09-20 DIAGNOSIS — Z8719 Personal history of other diseases of the digestive system: Secondary | ICD-10-CM | POA: Insufficient documentation

## 2012-09-20 DIAGNOSIS — M545 Low back pain, unspecified: Secondary | ICD-10-CM | POA: Insufficient documentation

## 2012-09-20 DIAGNOSIS — E876 Hypokalemia: Secondary | ICD-10-CM | POA: Insufficient documentation

## 2012-09-20 DIAGNOSIS — F17200 Nicotine dependence, unspecified, uncomplicated: Secondary | ICD-10-CM | POA: Insufficient documentation

## 2012-09-20 DIAGNOSIS — G8929 Other chronic pain: Secondary | ICD-10-CM | POA: Insufficient documentation

## 2012-09-20 DIAGNOSIS — Z8639 Personal history of other endocrine, nutritional and metabolic disease: Secondary | ICD-10-CM | POA: Insufficient documentation

## 2012-09-20 DIAGNOSIS — I2510 Atherosclerotic heart disease of native coronary artery without angina pectoris: Secondary | ICD-10-CM | POA: Insufficient documentation

## 2012-09-20 DIAGNOSIS — Z79899 Other long term (current) drug therapy: Secondary | ICD-10-CM | POA: Insufficient documentation

## 2012-09-20 DIAGNOSIS — I10 Essential (primary) hypertension: Secondary | ICD-10-CM | POA: Insufficient documentation

## 2012-09-20 DIAGNOSIS — F411 Generalized anxiety disorder: Secondary | ICD-10-CM | POA: Insufficient documentation

## 2012-09-20 DIAGNOSIS — I509 Heart failure, unspecified: Secondary | ICD-10-CM | POA: Insufficient documentation

## 2012-09-20 DIAGNOSIS — E785 Hyperlipidemia, unspecified: Secondary | ICD-10-CM | POA: Insufficient documentation

## 2012-09-20 DIAGNOSIS — Z7982 Long term (current) use of aspirin: Secondary | ICD-10-CM | POA: Insufficient documentation

## 2012-09-20 HISTORY — DX: Noninfective gastroenteritis and colitis, unspecified: K52.9

## 2012-09-20 HISTORY — DX: Unspecified astigmatism, unspecified eye: H52.209

## 2012-09-20 LAB — COMPREHENSIVE METABOLIC PANEL
ALT: 23 U/L (ref 0–35)
BUN: 11 mg/dL (ref 6–23)
Calcium: 8.8 mg/dL (ref 8.4–10.5)
GFR calc Af Amer: >90 mL/min (ref >90)
Glucose, Bld: 121 mg/dL — ABNORMAL HIGH (ref 70–99)
Sodium: 139 mEq/L (ref 135–145)
Total Protein: 6.8 g/dL (ref 6.0–8.3)

## 2012-09-20 LAB — CBC WITH DIFFERENTIAL/PLATELET
Eosinophils Absolute: 0 10*3/uL (ref 0.0–0.7)
Eosinophils Relative: 0 % (ref 0–5)
Lymphs Abs: 1.7 10*3/uL (ref 0.7–4.0)
MCH: 29.4 pg (ref 26.0–34.0)
MCHC: 34 g/dL (ref 30.0–36.0)
MCV: 86.3 fL (ref 78.0–100.0)
Platelets: 421 10*3/uL — ABNORMAL HIGH (ref 150–400)
RBC: 4.9 MIL/uL (ref 3.87–5.11)

## 2012-09-20 LAB — LIPASE, BLOOD: Lipase: 13 U/L (ref 11–59)

## 2012-09-20 MED ORDER — POTASSIUM CHLORIDE 20 MEQ/15ML (10%) PO LIQD: 40.0000 meq | Freq: Once | ORAL | Status: DC

## 2012-09-20 MED ORDER — SODIUM CHLORIDE 0.9 % IV SOLN
INTRAVENOUS | Status: DC
  Administered 2012-09-20: 15:00:00 via INTRAVENOUS

## 2012-09-20 MED ORDER — FENTANYL CITRATE 0.05 MG/ML IJ SOLN
50.0000 ug | INTRAMUSCULAR | Status: AC | PRN
  Administered 2012-09-20 (×2): 50 ug via INTRAVENOUS
  Filled 2012-09-20 (×2): qty 2

## 2012-09-20 NOTE — ED Notes (Signed)
Feels" trembly, Sseeing double"  Dizzy, thirsty.  Legs and back hurt, says she cannot walk, "joints are locking up"

## 2012-09-20 NOTE — ED Provider Notes (Signed)
History     CSN: [id number]  Arrival date & time 09/20/12  1329   First MD Initiated Contact with Patient 09/20/12 1419      Chief Complaint  Patient presents with  . Back Pain     HPI Pt was seen at 1435.   Per pt, c/o gradual onset and persistence of constant acute flair of her chronic low back "pain" for the past 1 month, worse over the past few days.  Denies any change in her usual chronic pain pattern.  Pain worsens with palpation of the area and body position changes. States her "legs locked up" and "felt heavy" this morning and she needed help to get out of bed.  States she feels "shakey all over" and has had to walk with a cane today.  Denies incont/retention of bowel or bladder, no saddle anesthesia, no focal motor weakness, no tingling/numbness in extremities, no fevers, no injury, no abd pain.  The patient has a significant history of similar symptoms previously, recently being evaluated for this complaint and multiple prior evals for same.  Pt also c/o constantly feeling "thirsty," "dizzy," "seeing double," and having several episodes of "diarrhea" all today.  Denies N/V, no black or blood in stools or emesis, no headache.     Past Medical History  Diagnosis Date  . Cardiomyopathy     resolved  . Chest pain     chronicc  . Hyperlipidemia   . HTN (hypertension)   . Hypothyroidism   . Adrenal insufficiency   . Anxiety   . Nondiabetic gastroparesis   . CAD (coronary artery disease)     Cath 2008 EF normal. RCA 50-60, Septal 50%. Myoview 3/12: EF 53% normal perfusion  . Chronic back pain   . History of CHF (congestive heart failure)   . QT prolongation   . Addison disease   . Mitral valve prolapse   . Gastroparesis   . Chronic diarrhea     Past Surgical History  Procedure Date  . Cholecystectomy   . Spine surgery   . Vesicovaginal fistula closure w/ tah   . Varicose vein surgery   . Abdominal hysterectomy   . Cardiac catheterization 03/2007    showed 60%  lesion in the right coronary artery    Family History  Problem Relation Age of Onset  . Coronary artery disease    . Heart attack Mother   . Cancer Father     Colon    History  Substance Use Topics  . Smoking status: Current Every Day Smoker -- 0.5 packs/day for 10 years    Types: Cigarettes  . Smokeless tobacco: Never Used     Comment: smokes 6-7 cigarettes per day  . Alcohol Use: No    Review of Systems ROS: Statement: All systems negative except as marked or noted in the HPI; Constitutional: Negative for fever and chills. ; ; Eyes: +"double vision."  Negative for eye pain, redness and discharge. ; ; ENMT: Negative for ear pain, hoarseness, nasal congestion, sinus pressure and sore throat. ; ; Cardiovascular: Negative for chest pain, palpitations, diaphoresis, dyspnea and peripheral edema. ; ; Respiratory: Negative for cough, wheezing and stridor. ; ; Gastrointestinal: +diarrhea. Negative for nausea, vomiting, abdominal pain, blood in stool, hematemesis, jaundice and rectal bleeding. . ; ; Genitourinary: Negative for dysuria, flank pain and hematuria. ; ; Musculoskeletal: +LBP. Negative for neck pain. Negative for swelling and trauma.; ; Skin: Negative for pruritus, rash, abrasions, blisters, bruising and skin lesion.; ; Neuro: +  feels "trembling all over."  Negative for headache, lightheadedness and neck stiffness. Negative for altered level of consciousness , altered mental status, extremity weakness, paresthesias, involuntary movement, seizure and syncope.     Allergies  Bee venom; Penicillins; Cephalexin; Doxycycline; Dust mite extract; Erythromycin; Morphine and related; Sulfonamide derivatives; and Tramadol  Home Medications   Current Outpatient Rx  Name  Route  Sig  Dispense  Refill  . ASPIRIN 81 MG PO CHEW   Oral   Chew 81-324 mg by mouth daily. Daily dose is 1 tab, takes 4 tabs if feeling chest pain or pressure         . HYDROCORTISONE 10 MG PO TABS   Oral   Take 20  mg by mouth 2 (two) times daily.          Marland Kitchen LEVOTHYROXINE SODIUM 100 MCG PO TABS   Oral   Take 100 mcg by mouth daily.          Marland Kitchen LORAZEPAM 1 MG PO TABS   Oral   Take 1 mg by mouth every 4 (four) hours. For anxiety Scheduled dose         . METOCLOPRAMIDE HCL 10 MG PO TABS   Oral   Take 20 mg by mouth 3 (three) times daily as needed. Runny stools         . MIRTAZAPINE 15 MG PO TABS   Oral   Take 15 mg by mouth at bedtime.         Marland Kitchen NITROGLYCERIN 0.4 MG SL SUBL   Sublingual   Place 1 tablet (0.4 mg total) under the tongue every 5 (five) minutes as needed for chest pain.   25 tablet   3   . ONDANSETRON HCL 4 MG PO TABS   Oral   Take 4-8 mg by mouth every 4 (four) hours as needed. For nausea         . OXYCODONE HCL 30 MG PO TABS   Oral   Take 30 mg by mouth every 4 (four) hours as needed. For pain         . PANTOPRAZOLE SODIUM 40 MG PO TBEC   Oral   Take 40 mg by mouth daily as needed. For acid reflux           BP 133/80  Pulse 109  Temp 97.9 F (36.6 C) (Oral)  Resp 20  Ht 5' 5.5" (1.664 m)  Wt 170 lb (77.111 kg)  BMI 27.86 kg/m2  SpO2 98%  Physical Exam 1440: Physical examination:  Nursing notes reviewed; Vital signs and O2 SAT reviewed;  Constitutional: Well developed, Well nourished, Well hydrated, In no acute distress; Head:  Normocephalic, atraumatic; Eyes: EOMI, PERRL, No scleral icterus; ENMT: Mouth and pharynx normal, Mucous membranes moist; Neck: Supple, Full range of motion, No lymphadenopathy; Cardiovascular: Regular rate and rhythm, No murmur, rub, or gallop; Respiratory: Breath sounds clear & equal bilaterally, No rales, rhonchi, wheezes.  Speaking full sentences with ease, Normal respiratory effort/excursion; Chest: Nontender, Movement normal; Abdomen: Soft, Nontender, Nondistended, Normal bowel sounds; Genitourinary: No CVA tenderness; Extremities: Pulses normal, No tenderness, No edema, No calf edema or asymmetry.; Neuro: AA&Ox3, Major  CN grossly intact.  Strength 5/5 equal bilat UE's and LE's.  DTR 2/4 equal bilat UE's and LE's.  No gross sensory deficits.  Normal cerebellar testing bilat UE's (finger-nose).  Pt states she cannot lift her bilat LE's high enough off the stretcher to reach her opposite knee but overall poor effort.  Speech  clear.  No facial droop.  No nystagmus.  No visual field cuts or double vision to single eye and bilat eyes confrontation testing.;; Skin: Color normal, Warm, Dry.    ED Course  Procedures   1445:  During my exam pt would start to shake her LLE or RUE and say "see there it goes I'm shaking."  Pt easily distracted and stopped shaking her limbs.  Overall poor effort when told to lift her legs off the stretcher.  Pt has multiple ED visits for chronic back pain, with these charts also reflecting pt's overall poor effort to move her LE's during physical exams.  Pt has also stated that she has been "unable to walk" such as today, and "needed to crawl" because of her pain.  No objective double vision on my exam.  Workup in progress.   2045:  Pt is not orthostatic.  Pt is able to stand without difficulty.  VA: 20/50 both eyes, 20/50 left, 20/50 right. Potassium repleted PO.  Tol PO well while in ED without N/V.  Pt repetitively asking for "more pain meds" and refusing to give urine specimen her entire ED visit.  Pt reminded she was given multiple doses of IV fentanyl, a more potent narcotic than morphine and dilaudid.  Pt continued to request "more pain meds."  Gave ED Tech verbal order to in/out cath.  Pt began to curse at staff stating that she "didn't need to give a urine specimen for back pain" and "needs more pain medicine."  Pt continuing to curse on my arrival to ED exam room to advise her of her dx testing results (stable MRI LS DDD, no acute findings other than sinusitis on MRI brain).  Pt states again to me that "I don't need to give you a urine specimen for a complaint of back pain."  Rationale  explained to pt and family with 2 ED Techs in the room with me.  Pt stated she "didn't care" and was not going to allow any further exam or give a urine specimen and was "getting out of here."  Pt then flipped both her legs up off the stretcher, flexed towards her head, to put on her panties without any apparent difficulty. Pt makes her own medical decisions.  Risks of AMA explained to pt and family, including, but not limited to:  stroke, heart attack, cardiac arrythmia ("irregular heart rate/beat"), "passing out," temporary and/or permanent disability, death.  Pt and family verb understanding and continue to refuse further exam or to give urine specimen, understanding the consequences of their decision.  I encouraged pt to follow up with her PMD and Pain Management doctor tomorrow and return to the ED immediately if symptoms worsen, or for any other concerns.  Pt and family verb understanding, agreeable.      MDM  MDM Reviewed: nursing note, vitals and previous chart Reviewed previous: labs Interpretation: labs, x-ray and CT scan   Results for orders placed during the hospital encounter of 09/20/12  CBC WITH DIFFERENTIAL      Component Value Range   WBC 15.2 (*) 4.0 - 10.5 K/uL   RBC 4.90  3.87 - 5.11 MIL/uL   Hemoglobin 14.4  12.0 - 15.0 g/dL   HCT 16.1  09.6 - 04.5 %   MCV 86.3  78.0 - 100.0 fL   MCH 29.4  26.0 - 34.0 pg   MCHC 34.0  30.0 - 36.0 g/dL   RDW 40.9  81.1 - 91.4 %   Platelets 421 (*)  150 - 400 K/uL   Neutrophils Relative 83 (*) 43 - 77 %   Neutro Abs 12.6 (*) 1.7 - 7.7 K/uL   Lymphocytes Relative 11 (*) 12 - 46 %   Lymphs Abs 1.7  0.7 - 4.0 K/uL   Monocytes Relative 6  3 - 12 %   Monocytes Absolute 0.8  0.1 - 1.0 K/uL   Eosinophils Relative 0  0 - 5 %   Eosinophils Absolute 0.0  0.0 - 0.7 K/uL   Basophils Relative 0  0 - 1 %   Basophils Absolute 0.0  0.0 - 0.1 K/uL  COMPREHENSIVE METABOLIC PANEL      Component Value Range   Sodium 139  135 - 145 mEq/L   Potassium  3.3 (*) 3.5 - 5.1 mEq/L   Chloride 102  96 - 112 mEq/L   CO2 25  19 - 32 mEq/L   Glucose, Bld 121 (*) 70 - 99 mg/dL   BUN 11  6 - 23 mg/dL   Creatinine, Ser 1.61  0.50 - 1.10 mg/dL   Calcium 8.8  8.4 - 09.6 mg/dL   Total Protein 6.8  6.0 - 8.3 g/dL   Albumin 3.1 (*) 3.5 - 5.2 g/dL   AST 38 (*) 0 - 37 U/L   ALT 23  0 - 35 U/L   Alkaline Phosphatase 80  39 - 117 U/L   Total Bilirubin 0.1 (*) 0.3 - 1.2 mg/dL   GFR calc non Af Amer >90  >90 mL/min   GFR calc Af Amer >90  >90 mL/min  LIPASE, BLOOD      Component Value Range   Lipase 13  11 - 59 U/L   Dg Lumbar Spine Complete 09/20/2012  *RADIOLOGY REPORT*  Clinical Data: Low back pain, unable to walk  LUMBAR SPINE - COMPLETE 4+ VIEW  Comparison: Prior radiographs the lumbosacral spine 06/17/2011  Findings: Interval progression of L5-S1 degenerative disc disease with progressive disc height loss and probable neural foraminal stenosis.  No evidence of acute fracture, or malalignment. Sclerosis of the L4-L5 L5-S1 facets consistent with underlying facet arthropathy.  Surgical clips in the right upper quadrant suggest prior cholecystectomy.  Visualized bowel gas pattern is unremarkable.  IMPRESSION:  Interval loss of disc space height at L5-S1 with increased facet arthropathy and now likely neural foraminal stenosis.  MRI of the lumbar spine could further evaluate if clinically warranted.  Lower lumbar facet arthropathy.   Original Report Authenticated By: Malachy Moan, M.D.    Ct Head Wo Contrast 09/20/2012  *RADIOLOGY REPORT*  Clinical Data: Headache.  Weakness.  CT HEAD WITHOUT CONTRAST  Technique:  Contiguous axial images were obtained from the base of the skull through the vertex without contrast.  Comparison: 04/16/2012  Findings: The brain has a normal appearance without evidence of malformation, atrophy, old or acute infarction, mass lesion, hemorrhage, hydrocephalus or extra-axial collection.  The calvarium is unremarkable.  There are some  inflammatory changes of the right maxillary sinus, not primarily completely evaluated.  IMPRESSION: Normal appearance of the brain.  Inflammatory change of the right maxillary sinus, not completely evaluated.   Original Report Authenticated By: Paulina Fusi, M.D.    Mr Brain Wo Contrast 09/20/2012  *RADIOLOGY REPORT*  Clinical Data: 47 year old female with double vision, dizziness, weakness.  MRI HEAD WITHOUT CONTRAST  Technique:  Multiplanar, multiecho pulse sequences of the brain and surrounding structures were obtained according to standard protocol without intravenous contrast.  Comparison: Head CT 09/20/2012.  Brain MRI 06/29/2010.  Findings: Stable normal cerebral volume. No restricted diffusion to suggest acute infarction.  No midline shift, mass effect, evidence of mass lesion, ventriculomegaly, extra-axial collection or acute intracranial hemorrhage.  Cervicomedullary junction and pituitary are within normal limits.  Major intracranial vascular flow voids are stable.  Visualized orbit soft tissues are within normal limits.  Stable and normal gray and white matter signal throughout the brain.  Negative visualized cervical spine. Visualized bone marrow signal is within normal limits.  New sub total opacification of the right maxillary sinus related to mucosal thickening and/or fluid.  Mild left maxillary sinus inflammatory changes.  Other paranasal sinuses are stable. Mastoids are clear.  Negative scalp soft tissues.  IMPRESSION: 1.  Stable and normal noncontrast MRI appearance the brain. 2.  New right greater than left maxillary sinus inflammatory changes.   Original Report Authenticated By: Erskine Speed, M.D.    Mr Lumbar Spine Wo Contrast 09/20/2012  *RADIOLOGY REPORT*  Clinical Data: Low back pain extending into the right hip and leg.  MRI LUMBAR SPINE WITHOUT CONTRAST  Technique:  Multiplanar and multiecho pulse sequences of the lumbar spine were obtained without intravenous contrast.  Comparison:  Lumbar spine MRI 03/15/2011.  Lumbar spine radiographs 09/21/2011.  Findings: Normal signals present in the conus medullaris which terminates at L1, within normal limits.  Marrow signal, vertebral body heights, and alignment are normal.  Limited imaging of the abdomen is unremarkable.  The disc levels at L2-3 and above are normal.  L3-4:  A leftward disc herniation is similar to the prior exam. Mild left lateral recess and moderate left foraminal stenosis is evident.  Mild foraminal narrowing on the right is stable.  L4-5:  A broad-based disc herniation is asymmetric to the left. Mild lateral recess narrowing is worse left than right.  Moderate left and mild right foraminal stenosis is stable.  L5-S1:  Negative.  IMPRESSION:  1.  Stable mild left lateral recess and moderate left foraminal stenosis at L3-4. 2.  Stable mild right foraminal narrowing at L3-4. 4.  Stable leftward disc herniation at L4-5 with left greater than right lateral recess and foraminal stenosis.   Original Report Authenticated By: Marin Roberts, M.D.              Laray Anger, DO 09/23/12 [phone]

## 2012-09-20 NOTE — ED Notes (Addendum)
Pt lifted hips and moved legs with no difficulty to attempt to get onto bedpan, but was unable to void.  Tech in room to cath pt.  Pt has been requesting pain medications, discussed this with physician.  Physician talked to pt regarding results of tests.  Pt has refused any additional treatment and wishes to leave AMA because she is not receiving any more narcotics.  Pt demanding wheelchair stating that she is going to fall and sue the hospital if she doesn't get one.

## 2012-09-20 NOTE — ED Notes (Addendum)
Pt reporting continued pain in lower back and difficulty in standing and walking.  Pt reports symptoms began earlier today. Stating that yesterday she was having no problems at all.  Pt does have history of chronic pain. Requesting additional pain medications.

## 2012-09-20 NOTE — ED Notes (Signed)
Advised patient that we had to do an in/out cath since she could not void.  Patient became very upset, cussing, and when she went to take her underwear off - she lifted her legs up above her head without any trouble.  Physician came into room and patient continued to cuss.  Physician explained why she needed a urine specimen and patient continued to cuss and finally refused the in/out cath.  Patient then proceeded to put her legs in the air above her head to get her underwear back on.

## 2012-09-20 NOTE — ED Notes (Signed)
Unable to take orthostatic vitals in standing position; pt states she cannot stand.

## 2012-11-08 IMAGING — CT CT CERVICAL SPINE W/O CM
1 series · 12 of 14 positions shown, 15 images · non-contrast
Comparison: Plain radiographs of the cervical spine 06/17/2011.
MRI cervical spine 10/26/2005.  CT cervical spine 09/17/2005.

CLINICAL DATA: Neck pain and stiffness radiating down the right
shoulder and arm.  No injury.  History of fusion of C2-C4.

CT CERVICAL SPINE WITHOUT CONTRAST
TECHNIQUE: Multidetector CT imaging of the cervical spine was
performed. Multiplanar CT image reconstructions were also
generated.

[Series 3: c-spine st · axial · 0.25mm/px · z∈[-277,-119]mm · 12 of 95 slices shown, 15 images]
[im 8/95  soft-tissue]
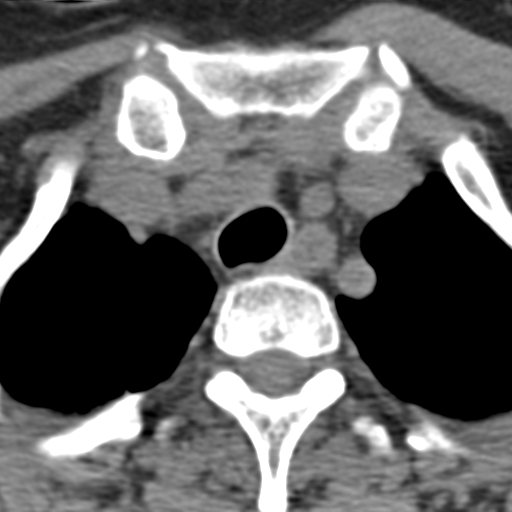
[im 8/95  bone]
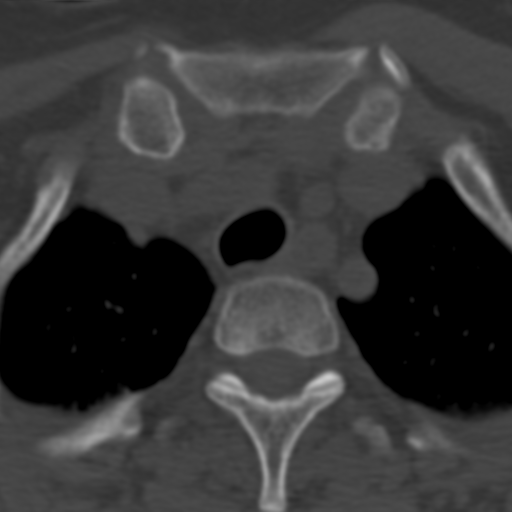
[im 15/95  bone]
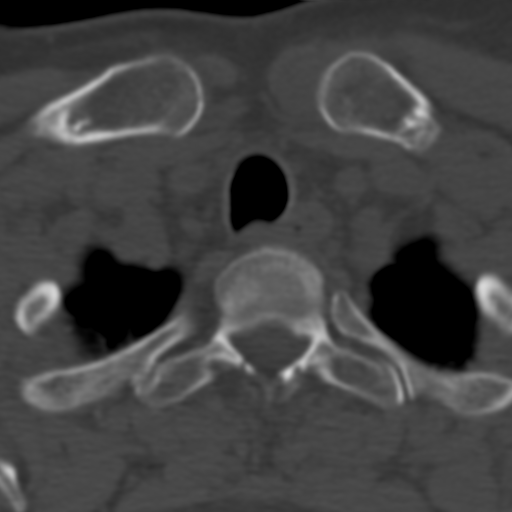
[im 22/95  bone]
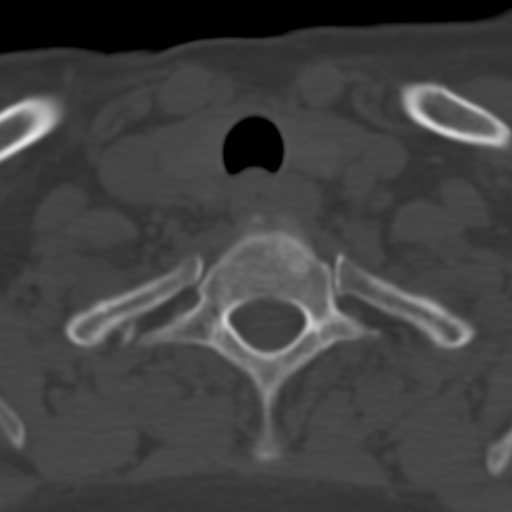
[im 29/95  bone]
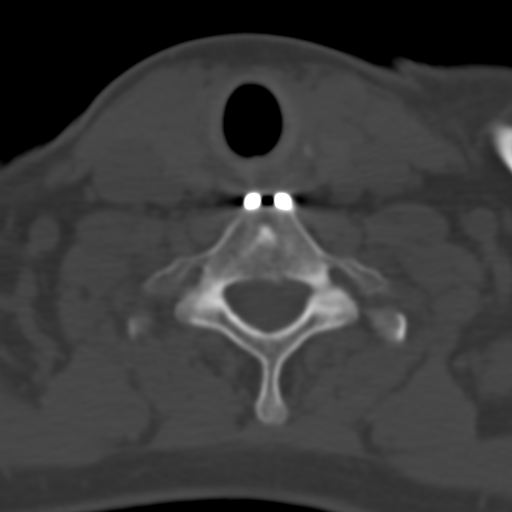
[im 37/95  soft-tissue]
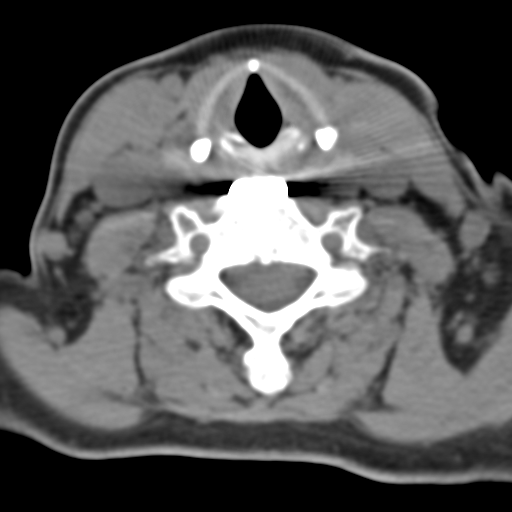
[im 37/95  bone]
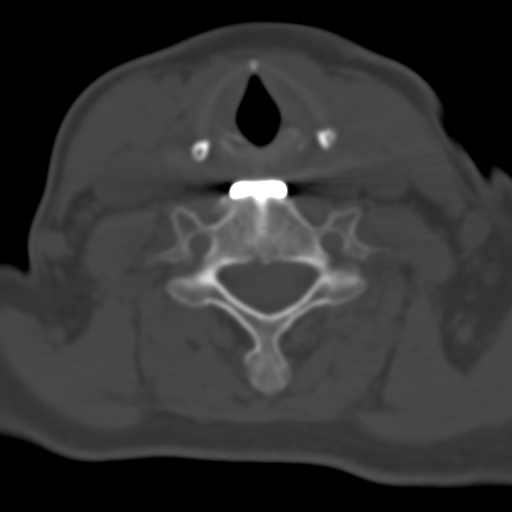
[im 44/95  bone]
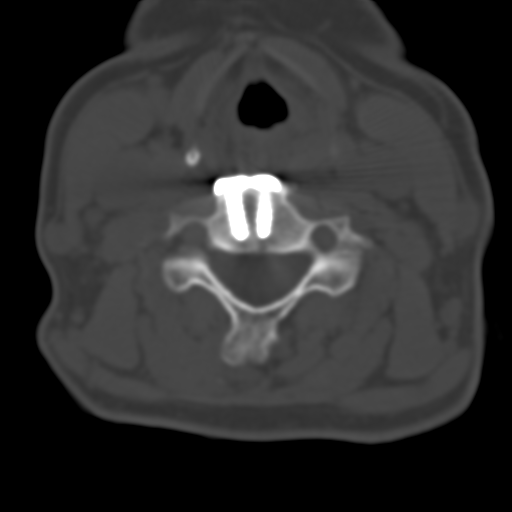
[im 51/95  bone]
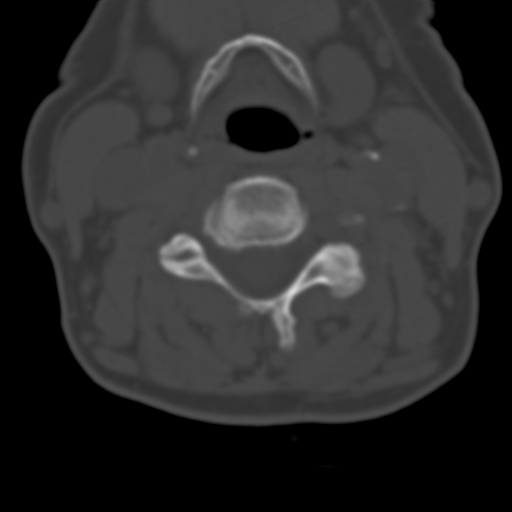
[im 58/95  bone]
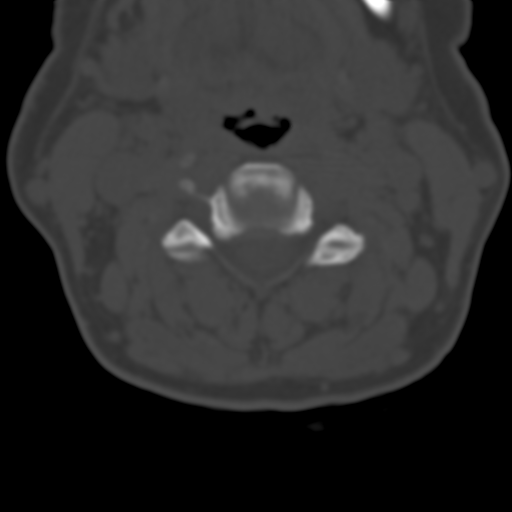
[im 66/95  soft-tissue]
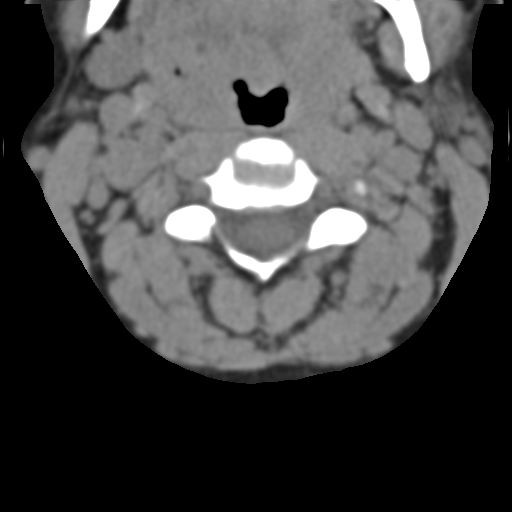
[im 66/95  bone]
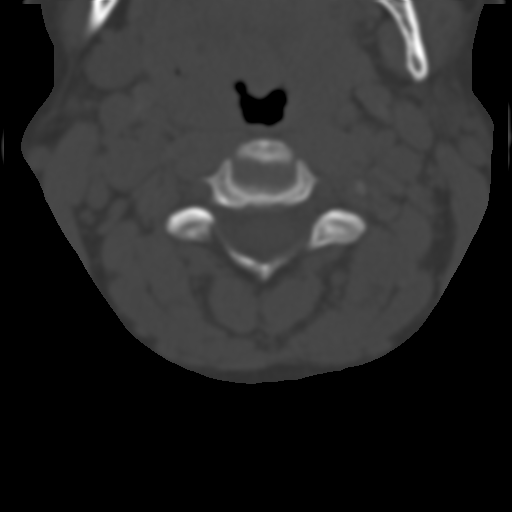
[im 73/95  bone]
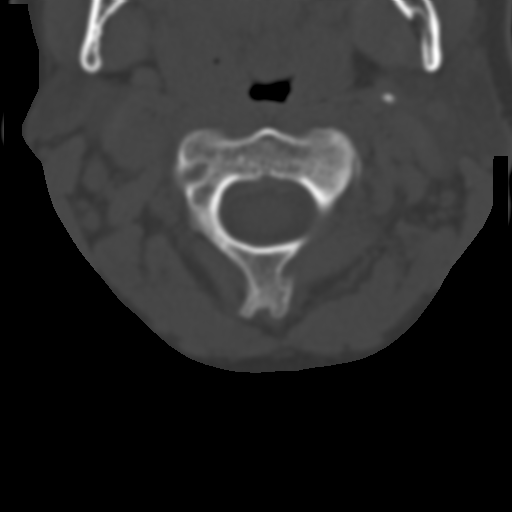
[im 80/95  bone]
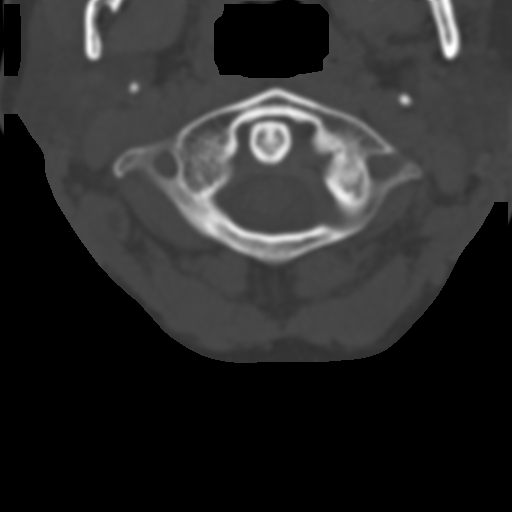
[im 87/95  bone]
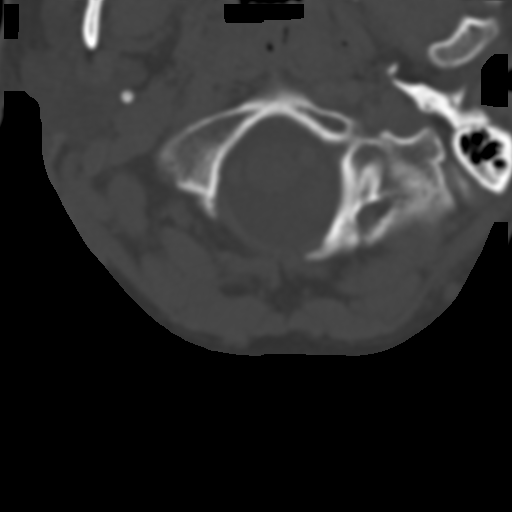

[12 of 14 positions shown; findings below may reference images not displayed]

FINDINGS: Postoperative changes with anterior plate and screw
fixation and interbody fusion from C5-C6 and C6-C7.  Residual
lucency is demonstrated in the disc space and C5-C6 consistent with
incomplete fusion.  Alignment of the fused segments appear stable
since the previous study and the fixation hardware appears intact.
There is reversal of the usual cervical lordosis which is likely
due to patient positioning but ligamentous injury is not excluded.
No vertebral compression deformities.  No prevertebral soft tissue
swelling.  Hypertrophic degenerative changes throughout the
cervical spine most prominent at C4-5 level and C7-T1 level.
Degenerative changes in the cervical facet joints.  No focal bone
lesion or bone destruction.  Osteophytes arising from the posterior
elements and / or facet joints cause some effacement of the neural
foramina bilaterally as C3-4, on the right at C4-5, on the right at
C5-6, bilaterally but greater on the right at C6-7, and bilaterally
at C7-T1.  Osteophytes or disc osteophyte complexes cause some
effacement the anterior thecal sac at C3-4, C4-5, C5-6, C6-7, and
C7-T1 levels.  Degenerative changes demonstrate mild progression
since the previous study.
IMPRESSION: Postoperative and degenerative changes throughout the cervical
spine as described.  No acute displaced fractures.  Straightening
of the usual cervical lordosis can be due to patient positioning,
ligamentous injury, or muscle spasm.

## 2012-11-09 IMAGING — CR DG CHEST 2V
2 series · 2 of 2 positions shown · non-contrast
Comparison: 12/17/2011

CLINICAL DATA: Right chest pain, cough

CHEST - 2 VIEW

[w chest pa]
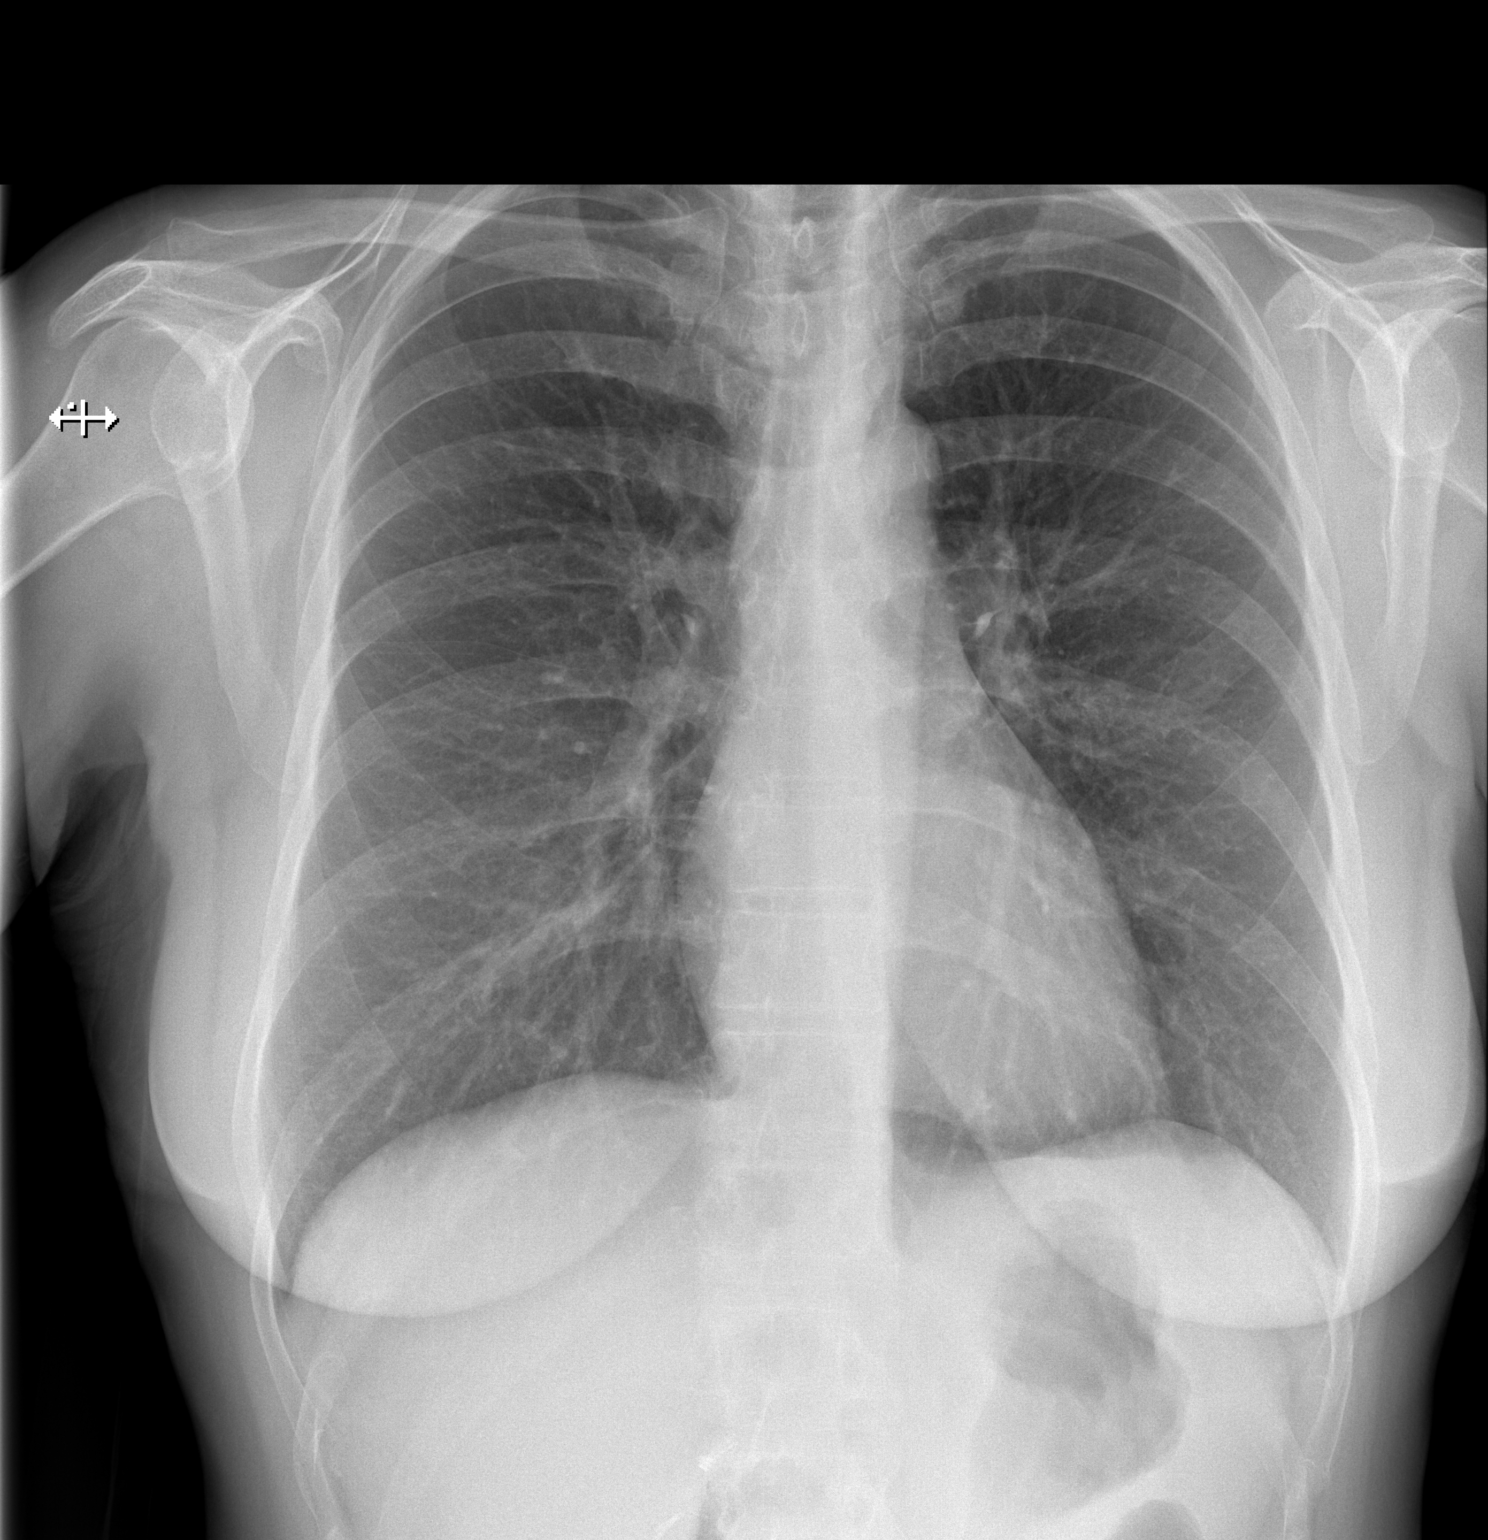

[w chest lat]
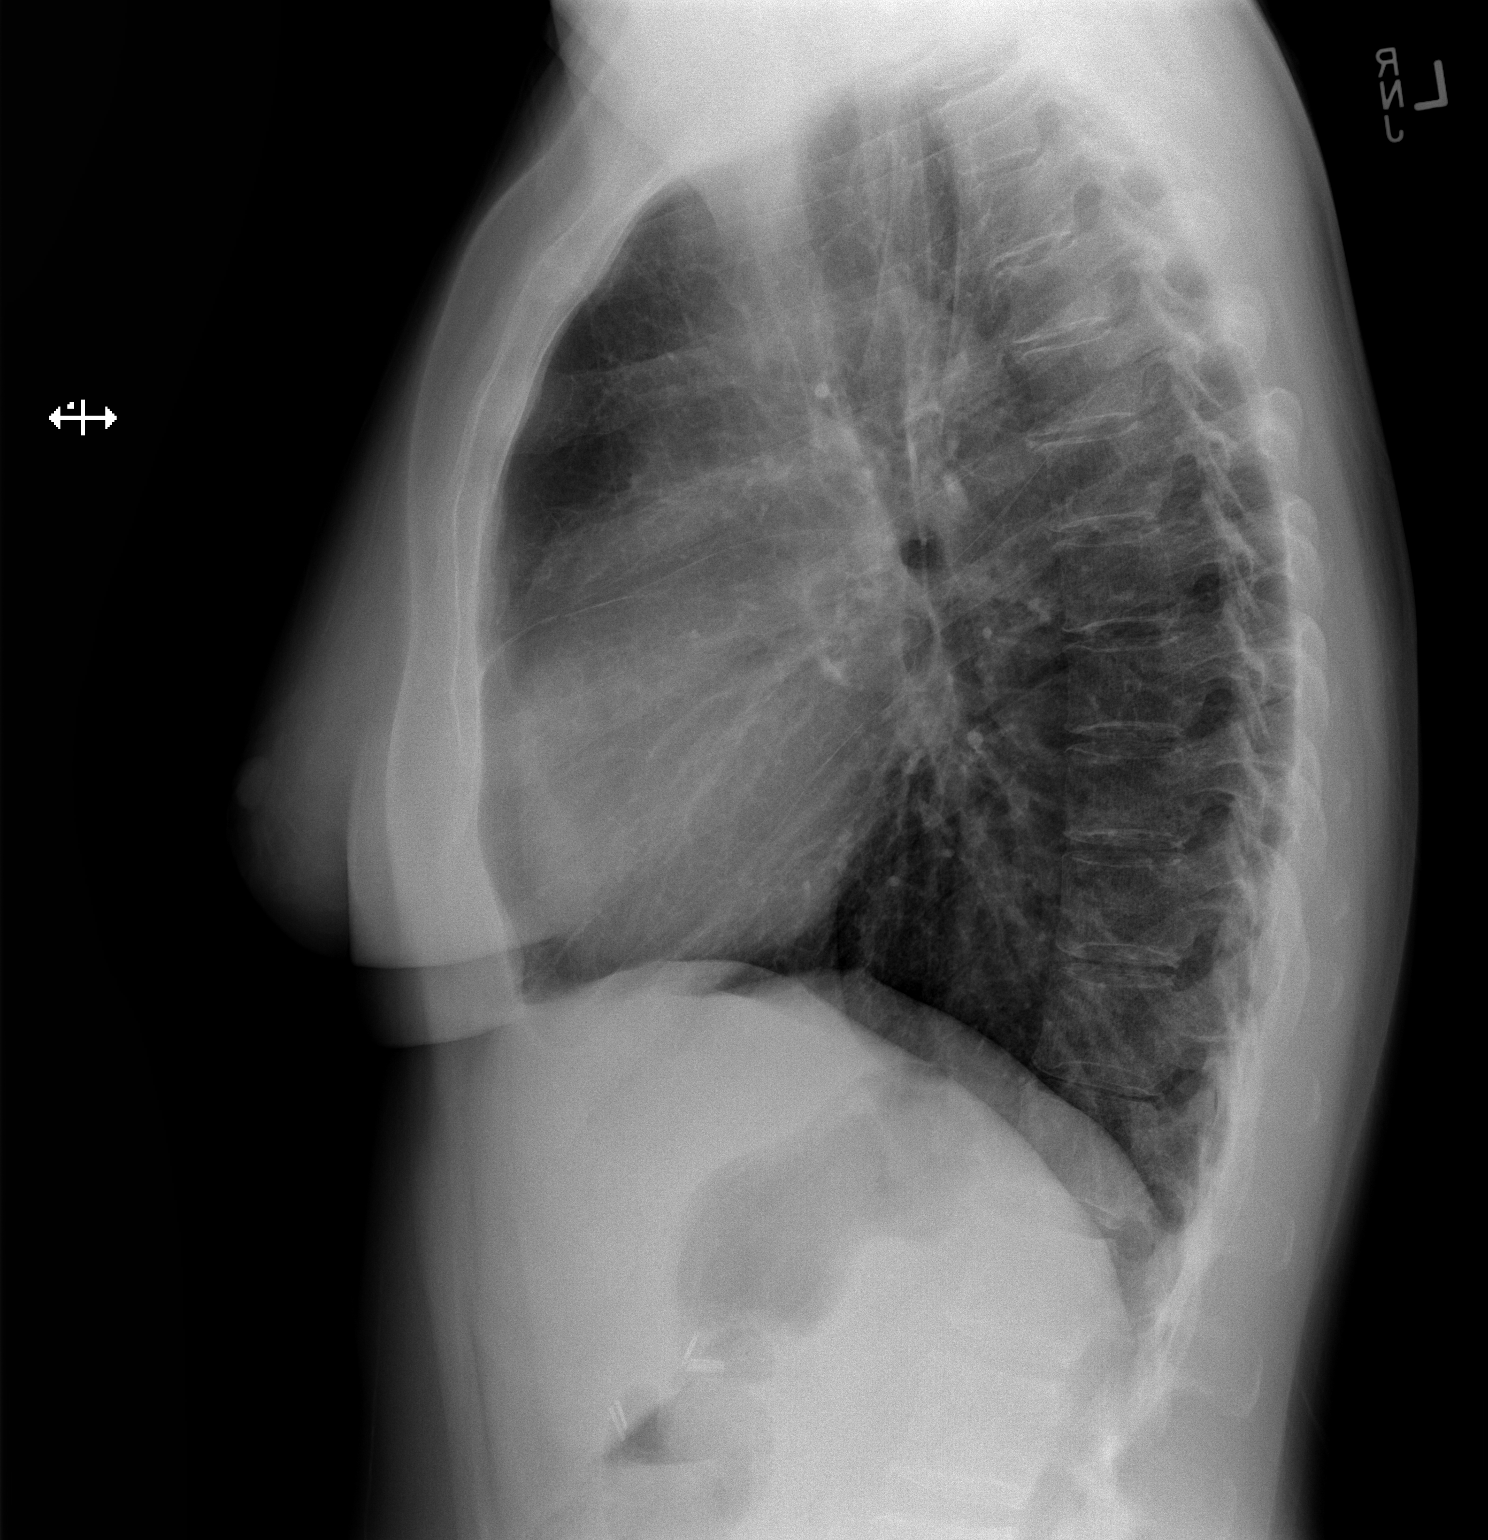

[2 of 2 positions shown; findings below may reference images not displayed]

FINDINGS: Lungs are clear. No pleural effusion or pneumothorax.

Cardiomediastinal silhouette is within normal limits.

Lower cervical spine fixation hardware.

Cholecystectomy clips.
IMPRESSION: Normal chest radiographs.

## 2012-11-09 IMAGING — CR DG THORACIC SPINE 2V
1 series · 1 of 1 positions shown · non-contrast
Comparison: None.

CLINICAL DATA: Right upper chest/back pain

THORACIC SPINE - 2 VIEW

[t thoracic spine lat]
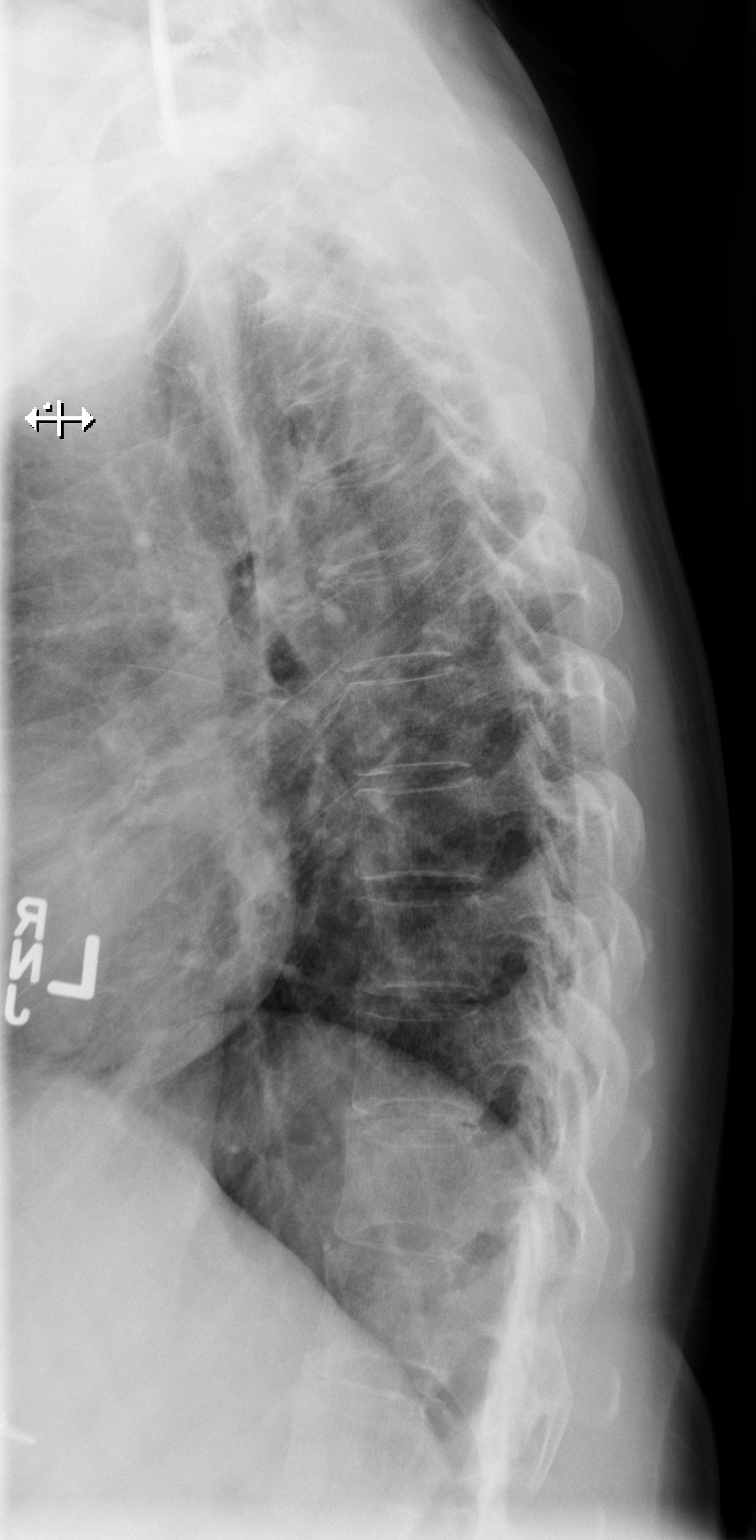

[1 of 1 positions shown; findings below may reference images not displayed]

FINDINGS: Normal thoracic kyphosis.

No evidence of fracture or dislocation.  Vertebral body heights are
maintained.

Lower cervical spine fixation hardware.

Cholecystectomy clips.

Visualized lungs are essentially clear.
IMPRESSION: No evidence of fracture or dislocation.

## 2012-11-09 IMAGING — CR DG THORACIC SPINE 2V
1 series · 1 of 1 positions shown · non-contrast
Comparison: None.

CLINICAL DATA: Right upper chest/back pain

THORACIC SPINE - 2 VIEW

[t thoracic swimmers]
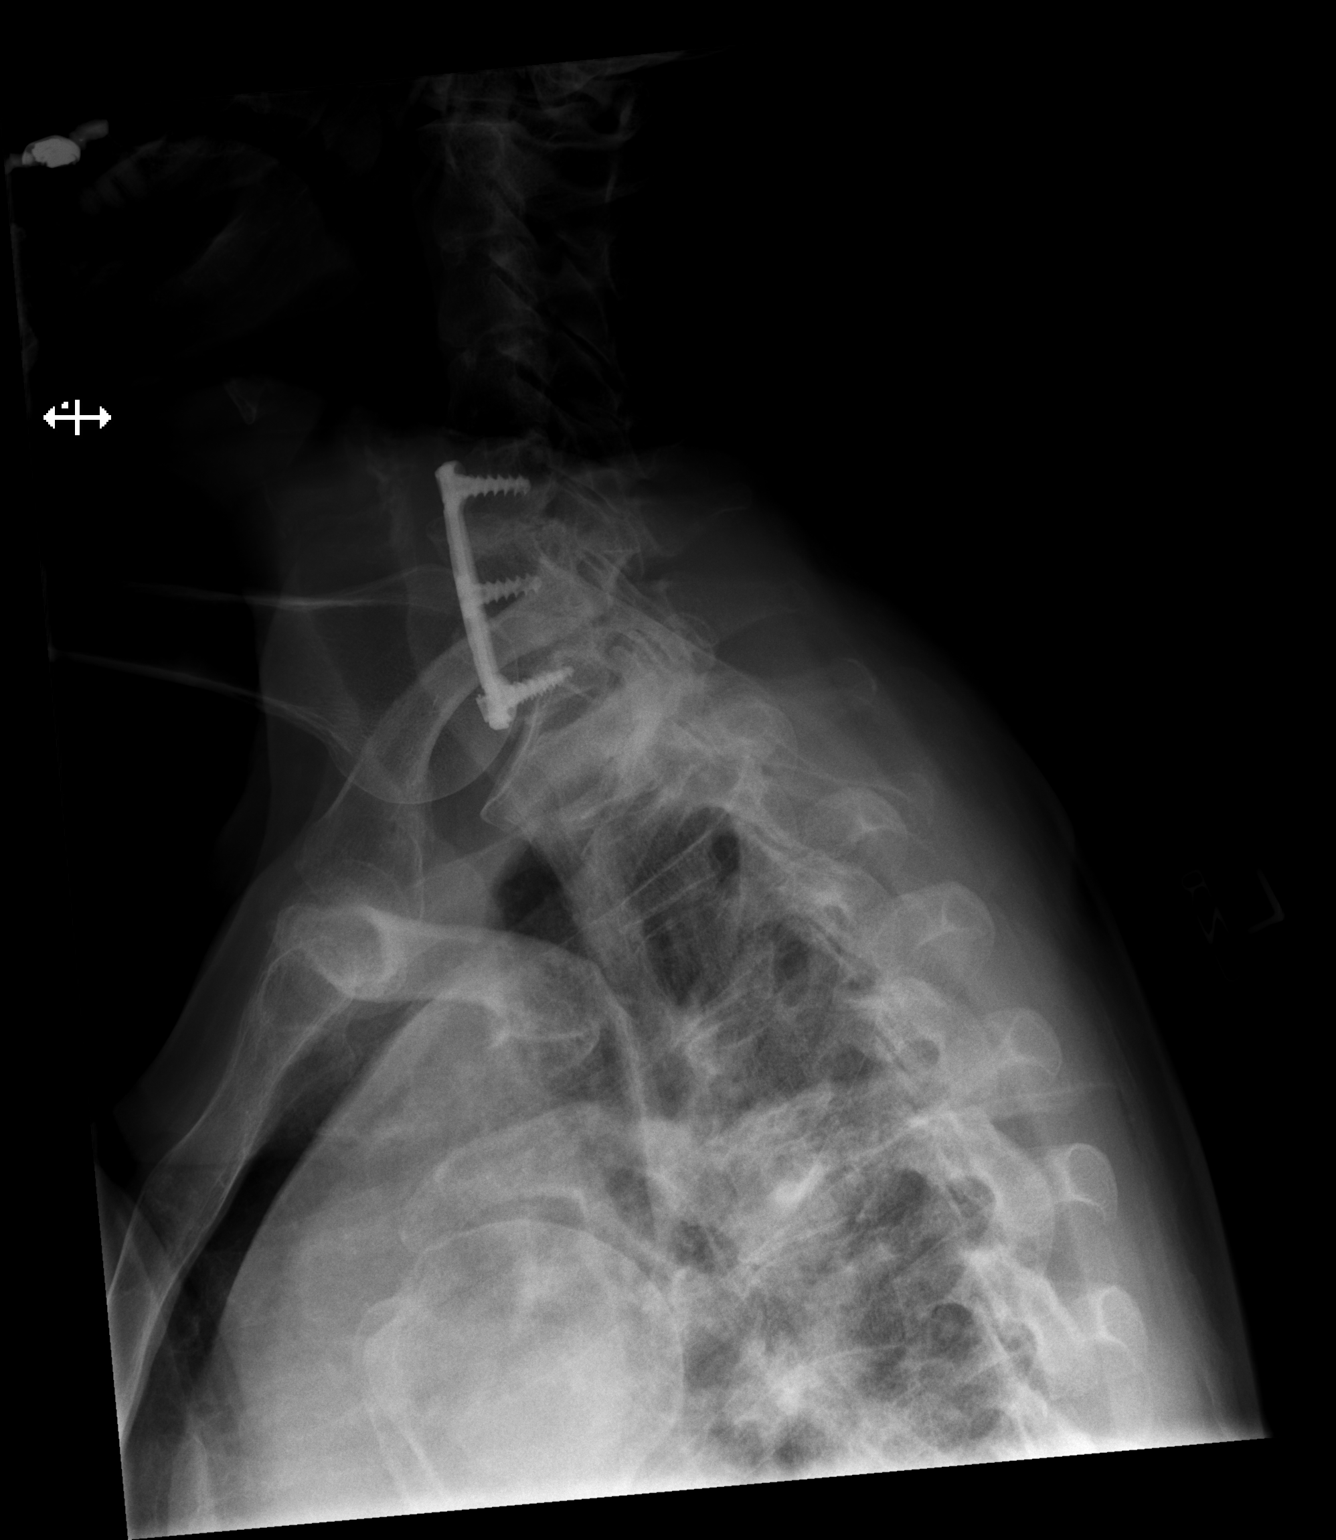

[1 of 1 positions shown; findings below may reference images not displayed]

FINDINGS: Normal thoracic kyphosis.

No evidence of fracture or dislocation.  Vertebral body heights are
maintained.

Lower cervical spine fixation hardware.

Cholecystectomy clips.

Visualized lungs are essentially clear.
IMPRESSION: No evidence of fracture or dislocation.

## 2012-11-12 IMAGING — CT CT ANGIO CHEST
1 of 2 series · 20 of 32 positions shown · IV contrast (APPLIED)
Comparison: Chest x-ray 03/03/2012

CLINICAL DATA: Chest pain

CT ANGIOGRAPHY CHEST
TECHNIQUE: Multidetector CT imaging of the chest using the
standard protocol during bolus administration of intravenous
contrast. Multiplanar reconstructed images including MIPs were
obtained and reviewed to evaluate the vascular anatomy.
Contrast: 100mL OMNIPAQUE IOHEXOL 350 MG/ML SOLN

[Series 8: pulm embolism 1.0 b25f thin · axial · 0.59mm/px · z∈[-204,+68]mm · 20 of 301 slices shown]
[im 14/301  lung]
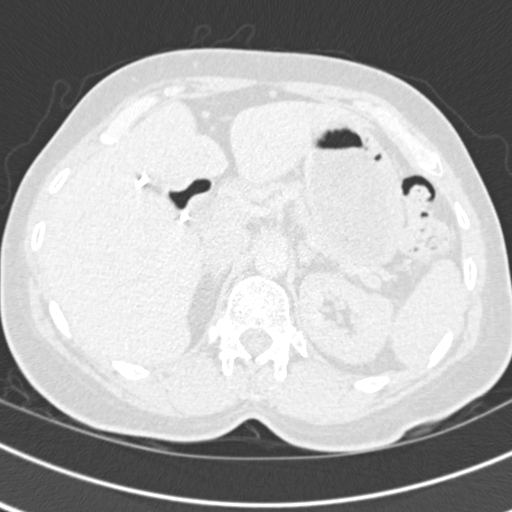
[im 27/301  soft-tissue]
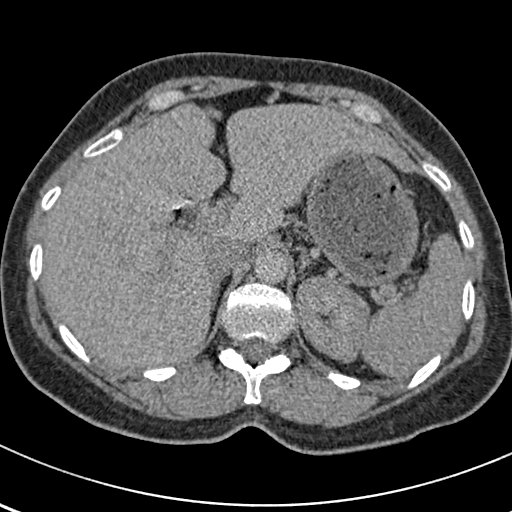
[im 40/301  lung]
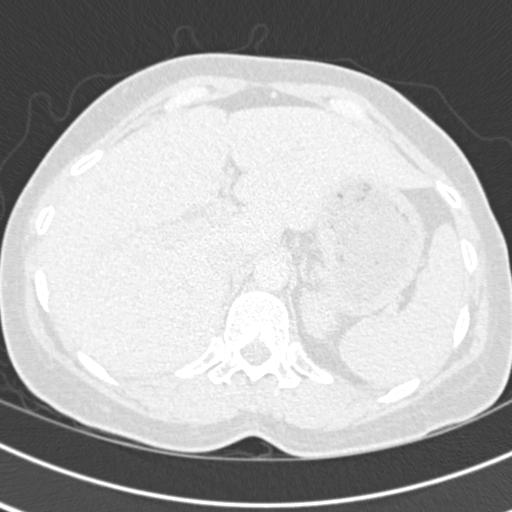
[im 53/301  soft-tissue]
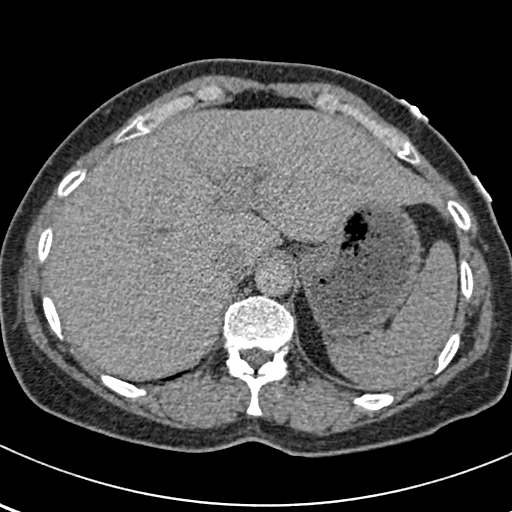
[im 66/301  lung]
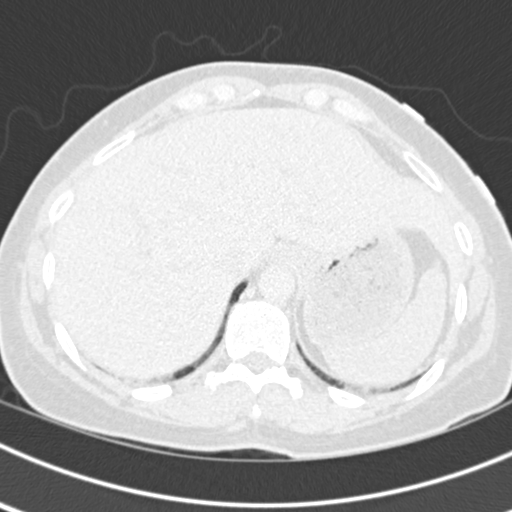
[im 92/301  soft-tissue]
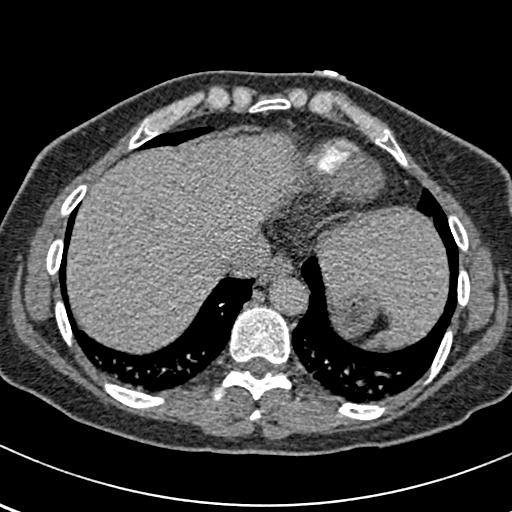
[im 105/301  lung]
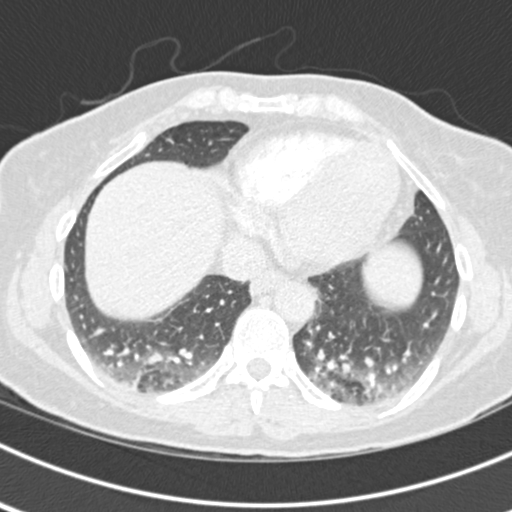
[im 118/301  soft-tissue]
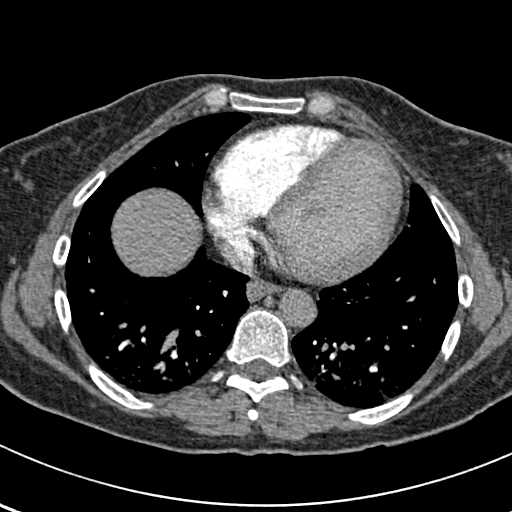
[im 131/301  lung]
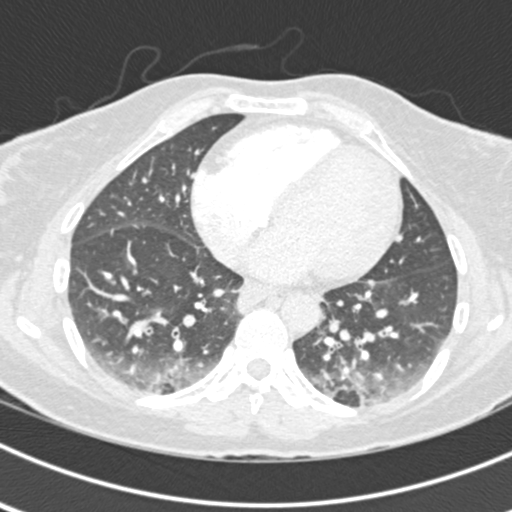
[im 144/301  soft-tissue]
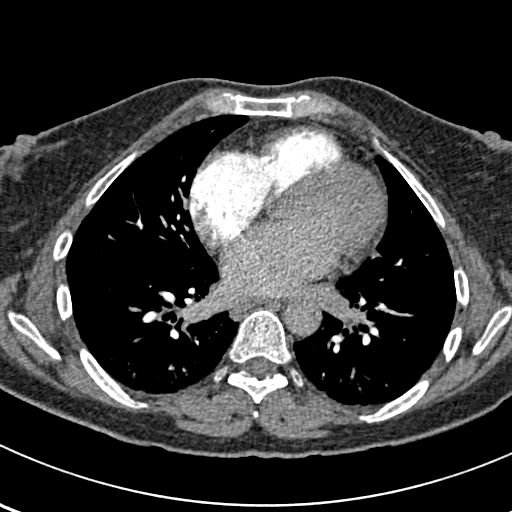
[im 157/301  lung]
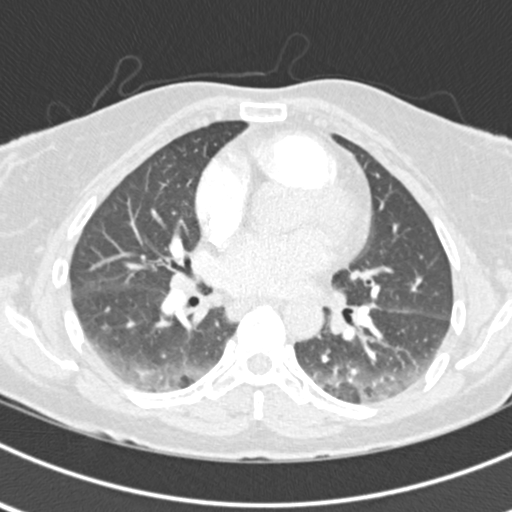
[im 170/301  soft-tissue]
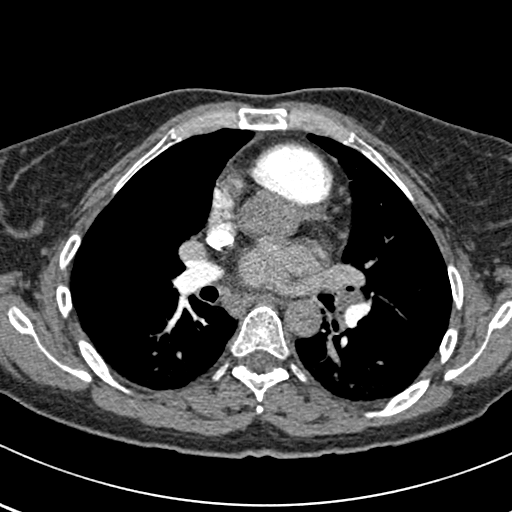
[im 183/301  lung]
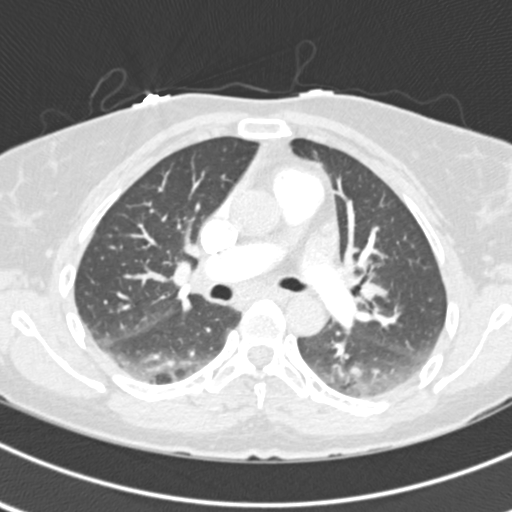
[im 196/301  soft-tissue]
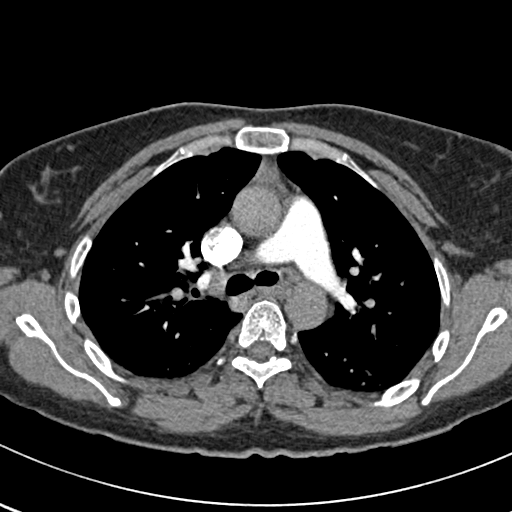
[im 209/301  lung]
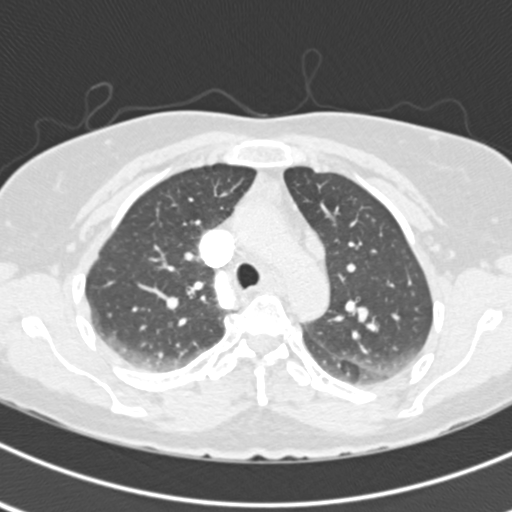
[im 235/301  soft-tissue]
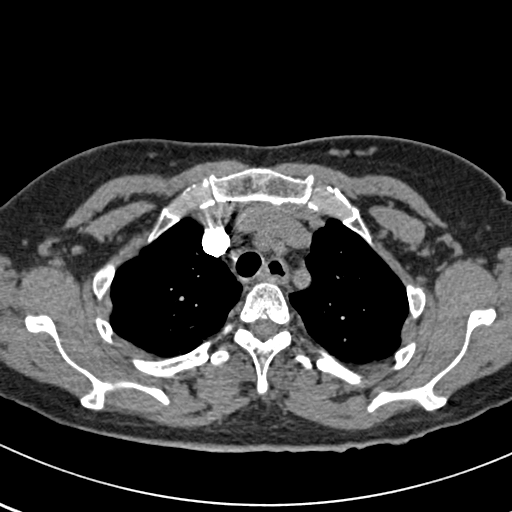
[im 248/301  lung]
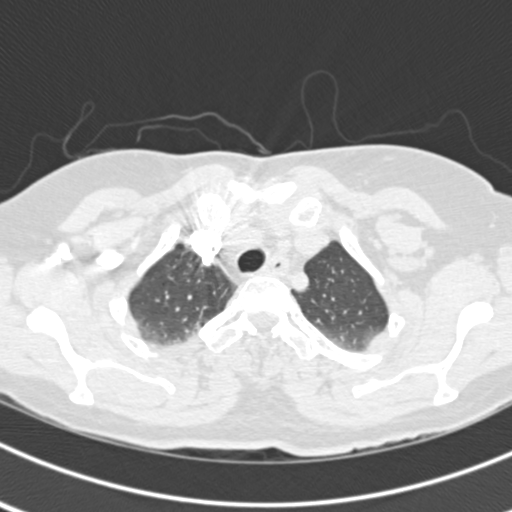
[im 261/301  soft-tissue]
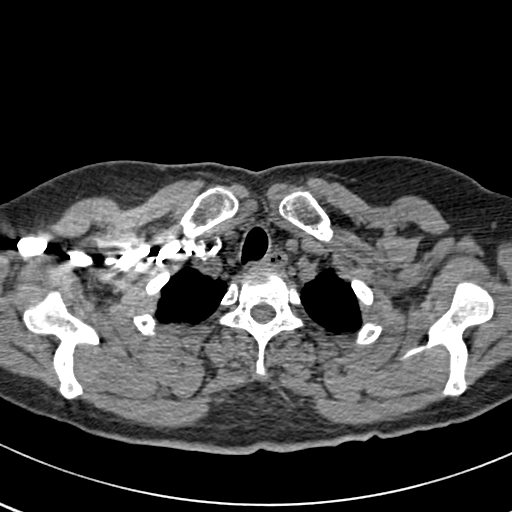
[im 274/301  lung]
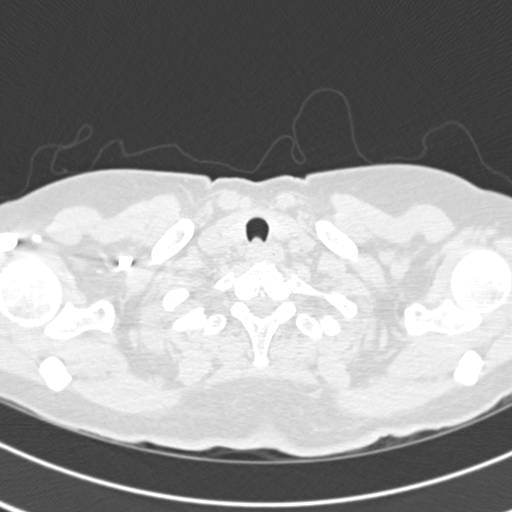
[im 287/301  soft-tissue]
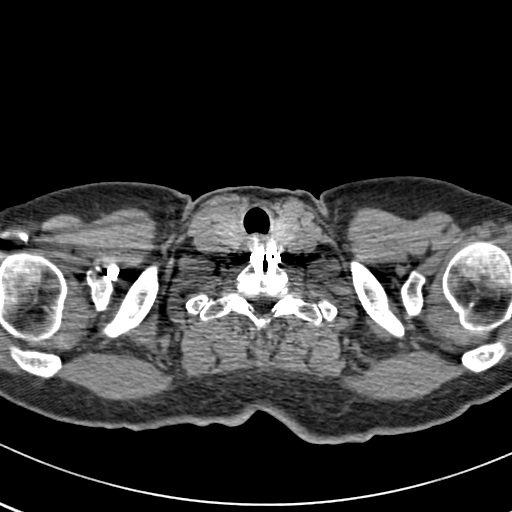

[20 of 32 positions shown; findings below may reference images not displayed]

FINDINGS: Negative for pulmonary embolism.  Heart size upper
normal.  Minimal coronary artery calcification.  No pericardial
effusion.  The aortic arch is not opacified.  No aortic aneurysm.
Dissection cannot be excluded due to lack of contrast in the aorta.

Mild dependent atelectasis in the lung bases.  Negative for
pneumonia.  No pleural fluid is present.  Negative for mass or
adenopathy.  No acute bony abnormality.
IMPRESSION: Negative for pulmonary embolism.  No acute abnormality.

## 2012-11-12 IMAGING — CR DG CHEST 1V PORT
1 series · 1 of 1 positions shown · non-contrast
Comparison: 02/29/2012from [HOSPITAL]

CLINICAL DATA: Chest pain and right arm pain.  History of high
blood pressure.

PORTABLE CHEST - 1 VIEW

[AP]
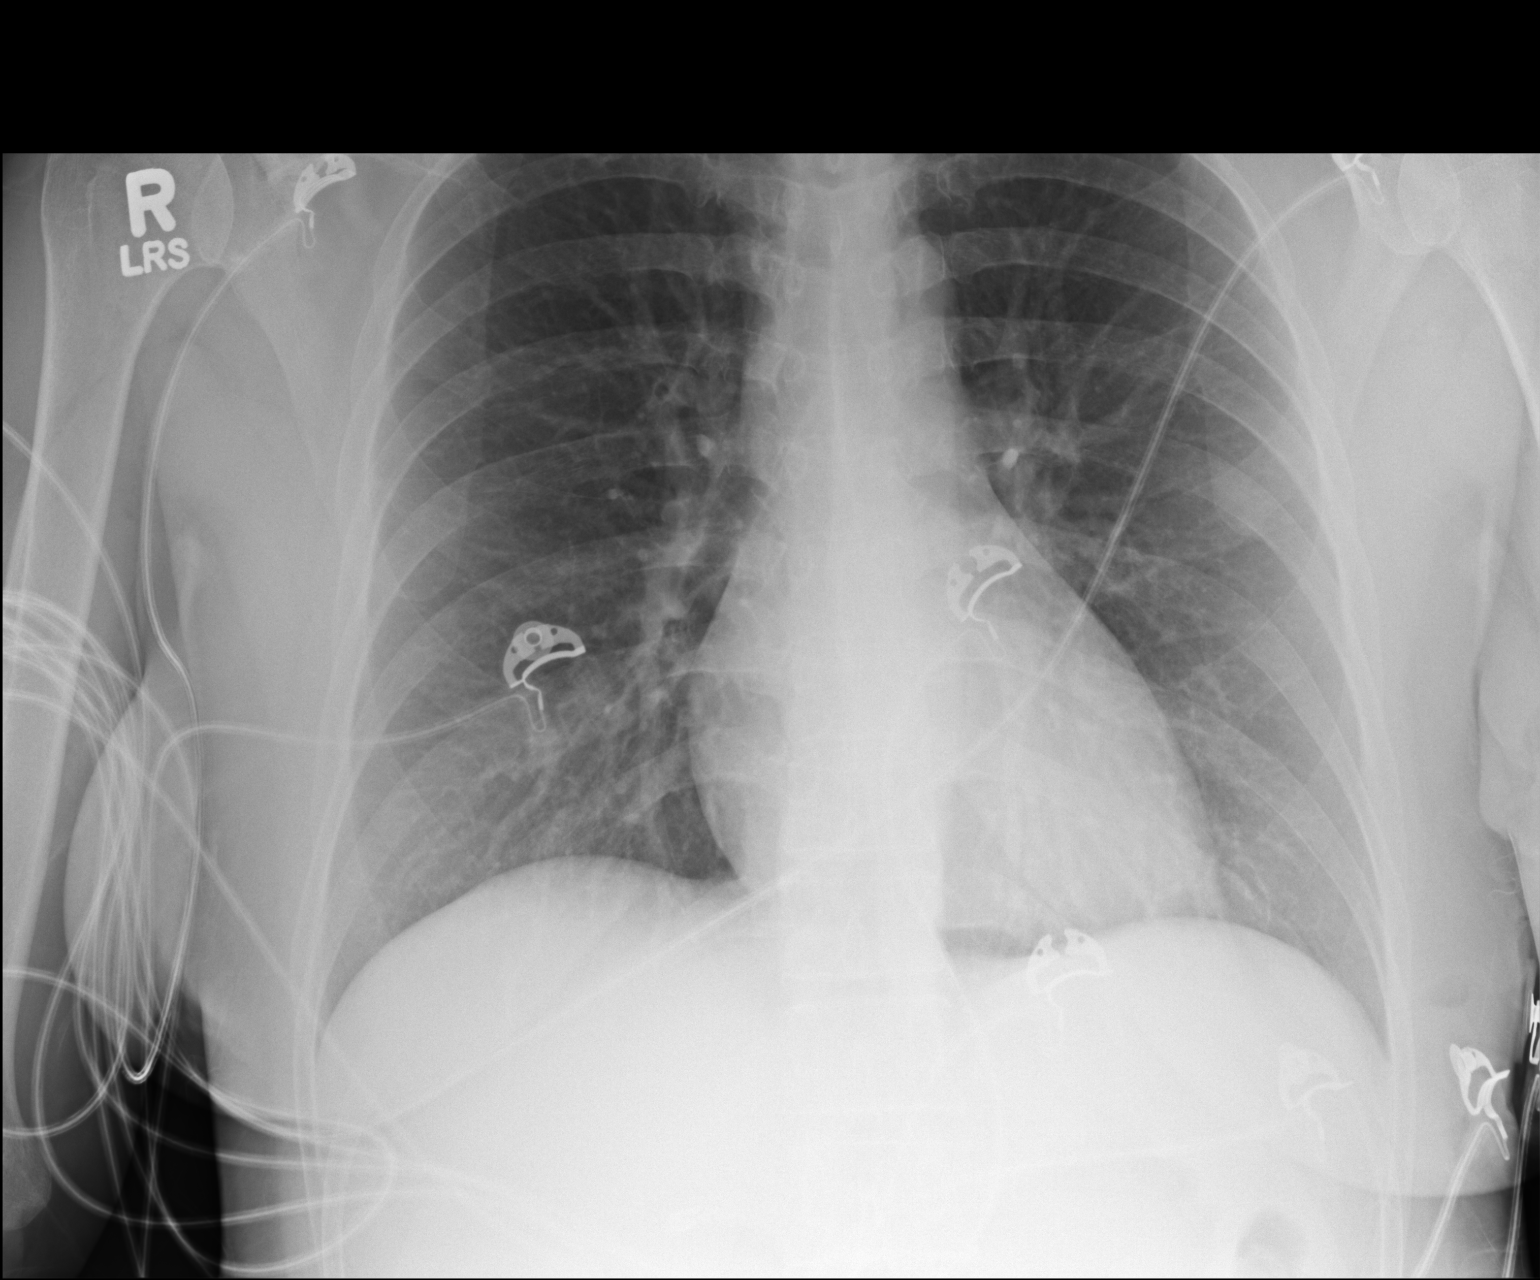

[1 of 1 positions shown; findings below may reference images not displayed]

FINDINGS: Normal heart size and pulmonary vascularity.  No focal
airspace consolidation in the lungs.  No blunting of costophrenic
angles.  No pneumothorax.  Mediastinal contours appear intact.  No
significant change since previous study.
IMPRESSION: No evidence of active pulmonary disease.

## 2012-11-22 IMAGING — MR MR CERVICAL SPINE W/O CM
6 series · 32 of 48 positions shown · non-contrast
Comparison: 02/28/2012 CT.  10/26/2005 MR.

CLINICAL DATA: Neck and upper back pain.  Prior fusion.  No known
injury.

MRI CERVICAL SPINE WITHOUT CONTRAST
TECHNIQUE: Multiplanar and multiecho pulse sequences of the
cervical spine, to include the craniocervical junction and
cervicothoracic junction, were obtained according to standard
protocol without intravenous contrast.

[Series 3: T2 · sagittal · 3.0mm · 0.47mm/px · 4 of 13 slices shown (1 of 3)]
[im 1/13]
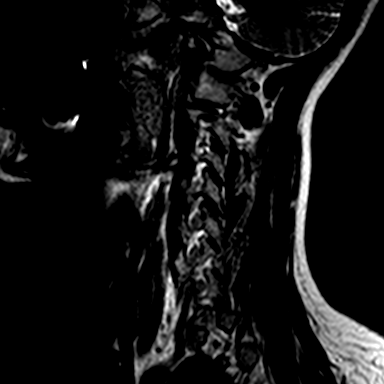
[im 5/13]
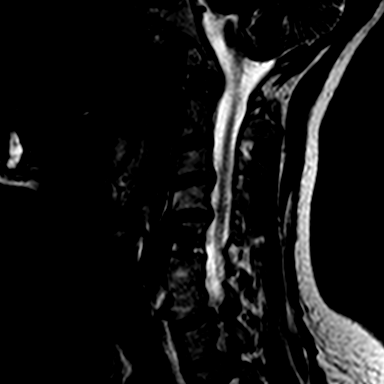
[im 9/13]
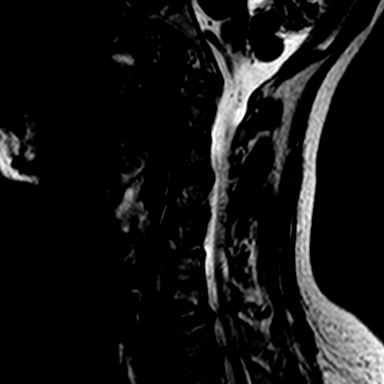
[im 13/13]
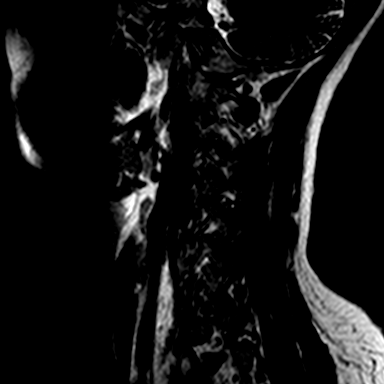

[Series 4: FLAIR · sagittal · 3.0mm · 0.47mm/px · 3 of 13 slices shown]
[im 1/13]
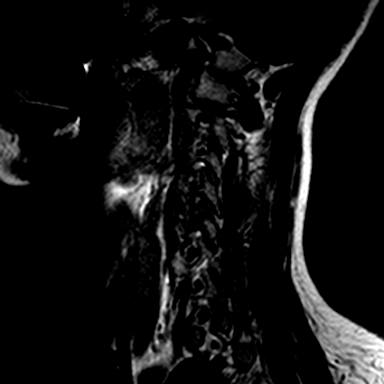
[im 7/13]
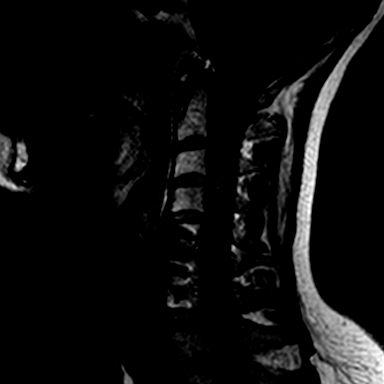
[im 13/13]
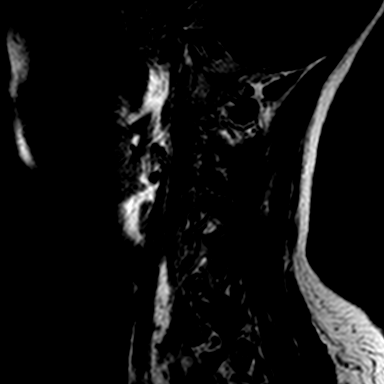

[Series 5: ir sag · sagittal · 3.0mm · 0.47mm/px · 3 of 13 slices shown]
[im 1/13]
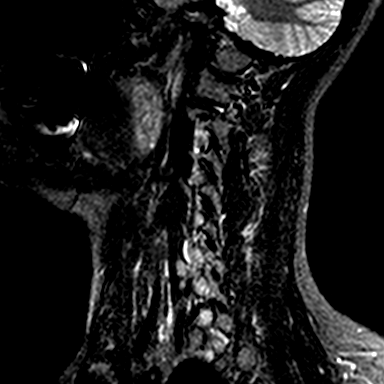
[im 7/13]
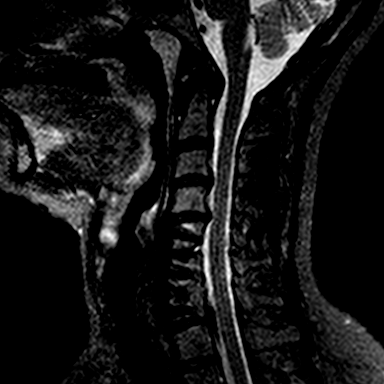
[im 13/13]
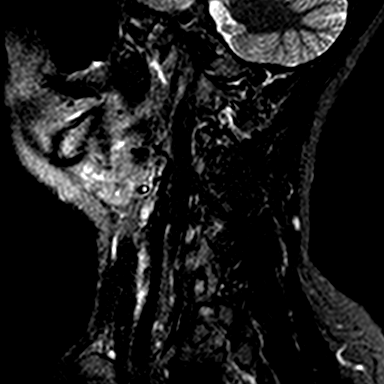

[Series 6: T2 · axial · 3.0mm · 0.29mm/px · z∈[-101,-6]mm · 7 of 30 slices shown (2 of 3)]
[im 1/30]
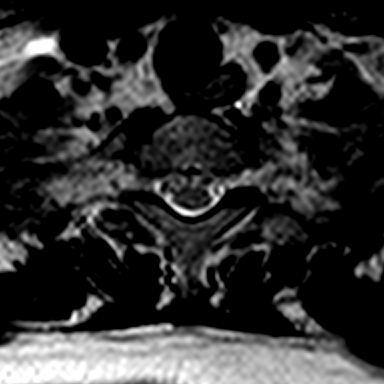
[im 5/30]
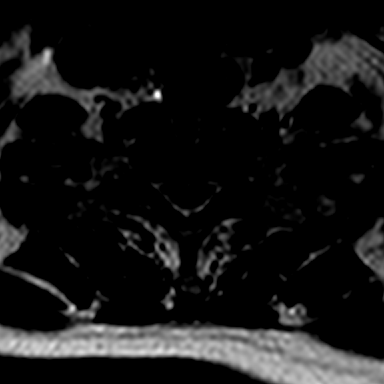
[im 10/30]
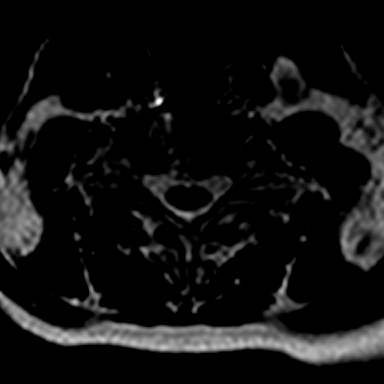
[im 15/30]
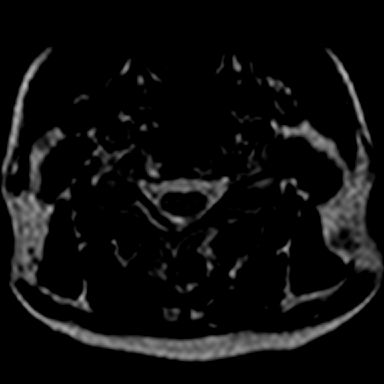
[im 20/30]
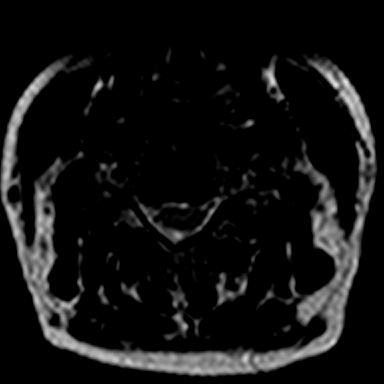
[im 25/30]
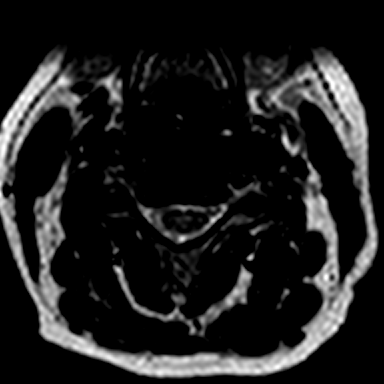
[im 30/30]
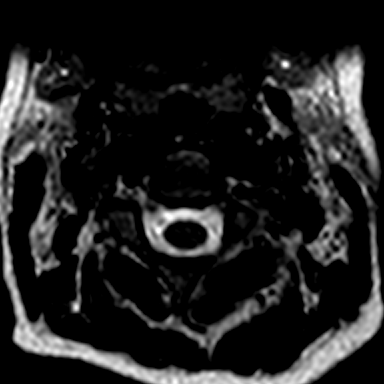

[Series 7: GRE · axial · 3.0mm · 0.43mm/px · z∈[-101,-47]mm · 5 of 30 slices shown]
[im 1/30]
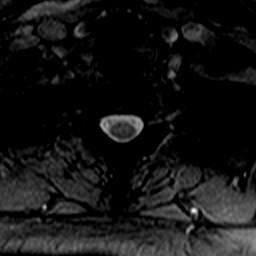
[im 5/30]
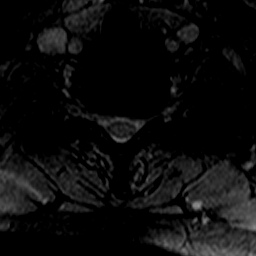
[im 10/30]
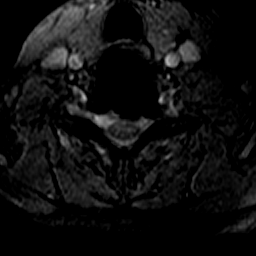
[im 15/30]
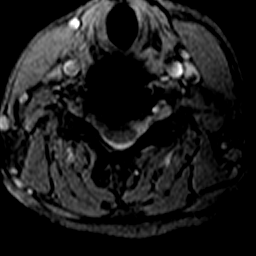
[im 20/30]
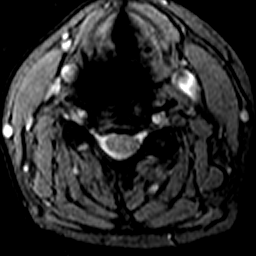

[Series 8: T2 · axial · 1.2mm · 0.23mm/px · z∈[-108,-4]mm · 10 of 96 slices shown (3 of 3)]
[im 5/96]
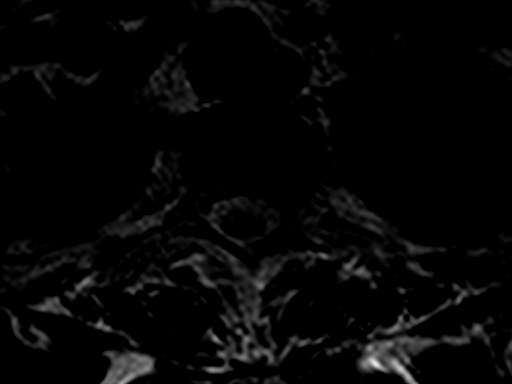
[im 17/96]
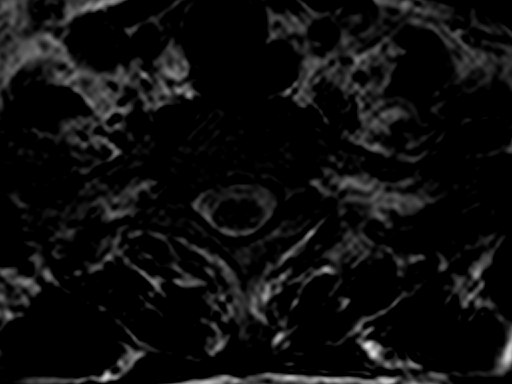
[im 29/96]
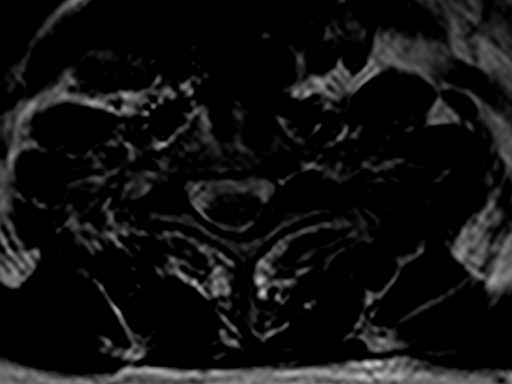
[im 42/96]
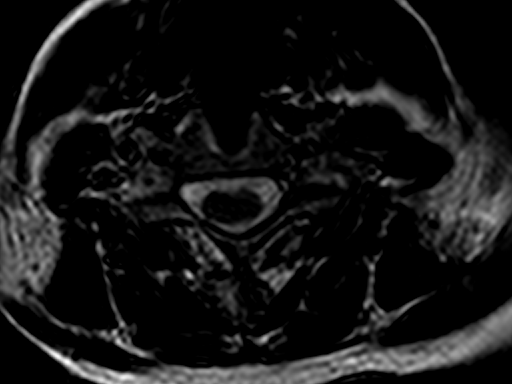
[im 50/96]
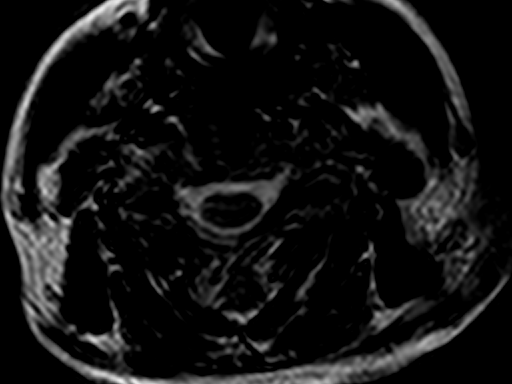
[im 54/96]
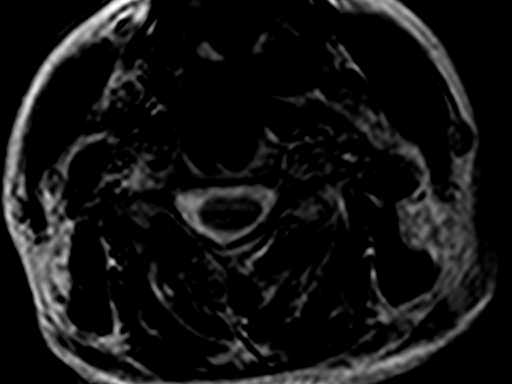
[im 67/96]
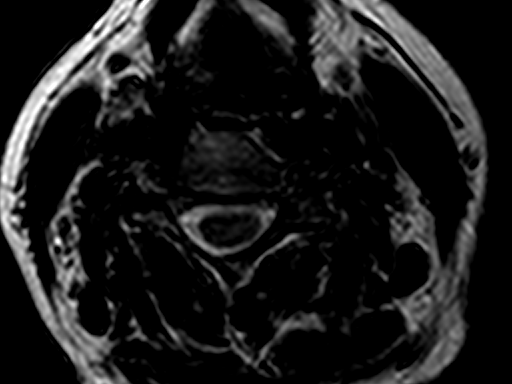
[im 79/96]
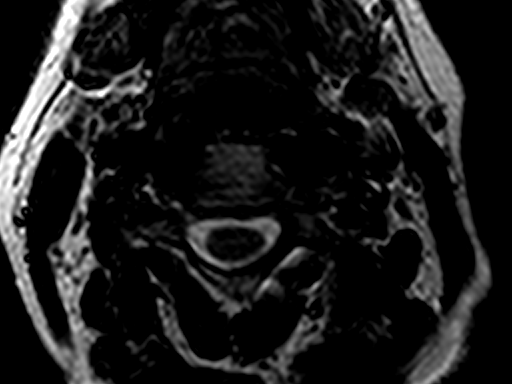
[im 83/96]
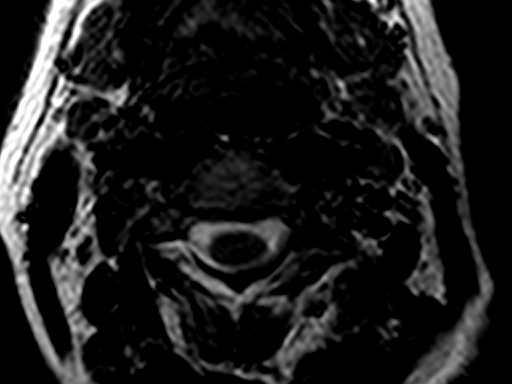
[im 91/96]
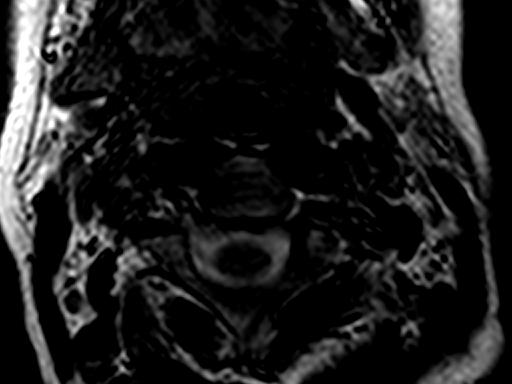

[32 of 48 positions shown; findings below may reference images not displayed]

FINDINGS: Cervical medullary junction unremarkable.  No focal
cervical cord signal abnormality.

Both vertebral arteries are patent.  Visualized paravertebral
structures unremarkable.

C2-3:  Negative.

C3-4:  Broad-based disc osteophyte.  Spinal stenosis with minimal
cord flattening.  Moderate to marked right-sided and mild to
moderate left-sided foraminal narrowing.

C4-5:  Broad-based disc osteophyte complex.  Mild spinal stenosis.
Minimal cord contact.  Moderate right-sided and minimal left-sided
foraminal narrowing.

C5-6: Prior attempted fusion.  CT demonstrates poor bony fusion
across the disc space.  Right-sided uncinate bony overgrowth and
mild right foraminal narrowing.

C6-7:  Prior fusion.  Left posterior lateral bony spur.  No cord
compression.  This may affect the adjacent nerve root.

C7-T1:  Facet joint degenerative changes.  Broad-based disc
osteophyte complex.  Mild spinal stenosis.  Mild to moderate
bilateral foraminal narrowing.

T1-2:  Minimal bulge.  Very mild left foraminal narrowing.
IMPRESSION: C3-4 broad-based disc osteophyte.  Spinal stenosis with minimal
cord flattening.  Moderate to marked right-sided and mild to
moderate left-sided foraminal narrowing.

C4-5 broad-based disc osteophyte complex.  Mild spinal stenosis.
Minimal cord contact.  Moderate right-sided and minimal left-sided
foraminal narrowing.

C5-6 prior attempted fusion .  Right-sided uncinate bony overgrowth
and mild right foraminal narrowing.

C6-7 prior fusion.  Left posterior lateral bony spur.  No cord
compression.  This may affect the adjacent nerve root.

C7-T1 facet joint degenerative changes.  Broad-based disc
osteophyte complex.  Mild spinal stenosis.  Mild to moderate
bilateral foraminal narrowing.

## 2012-11-22 IMAGING — MG MM DIGITAL SCREENING BILAT
4 series · 4 of 4 positions shown · non-contrast
Comparison: Previous exams

CLINICAL DATA: Screening.

DIGITAL SCREENING MAMMOGRAM WITH CAD

[L CC]
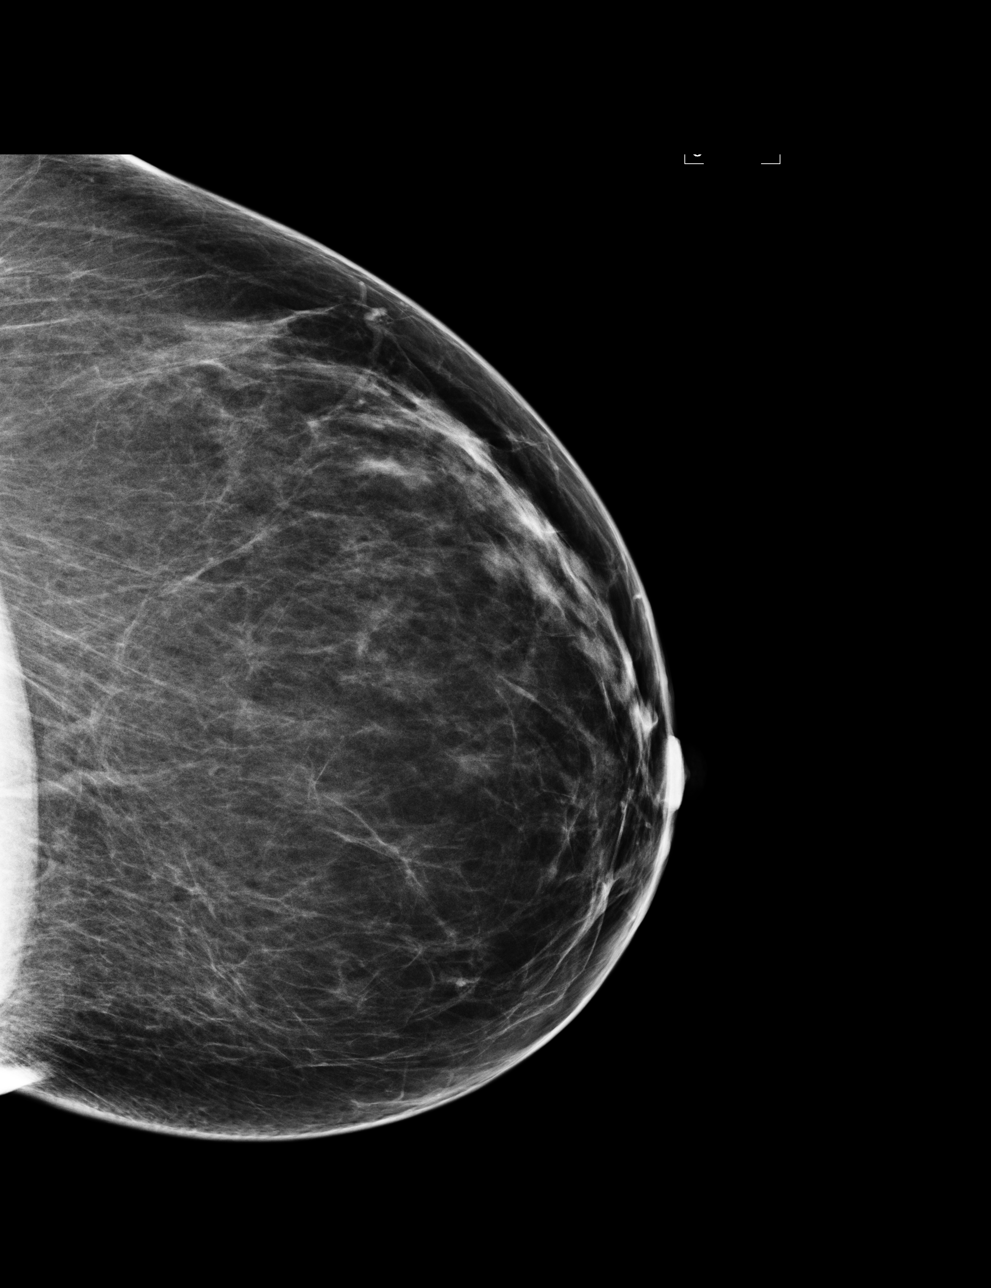

[L MLO]
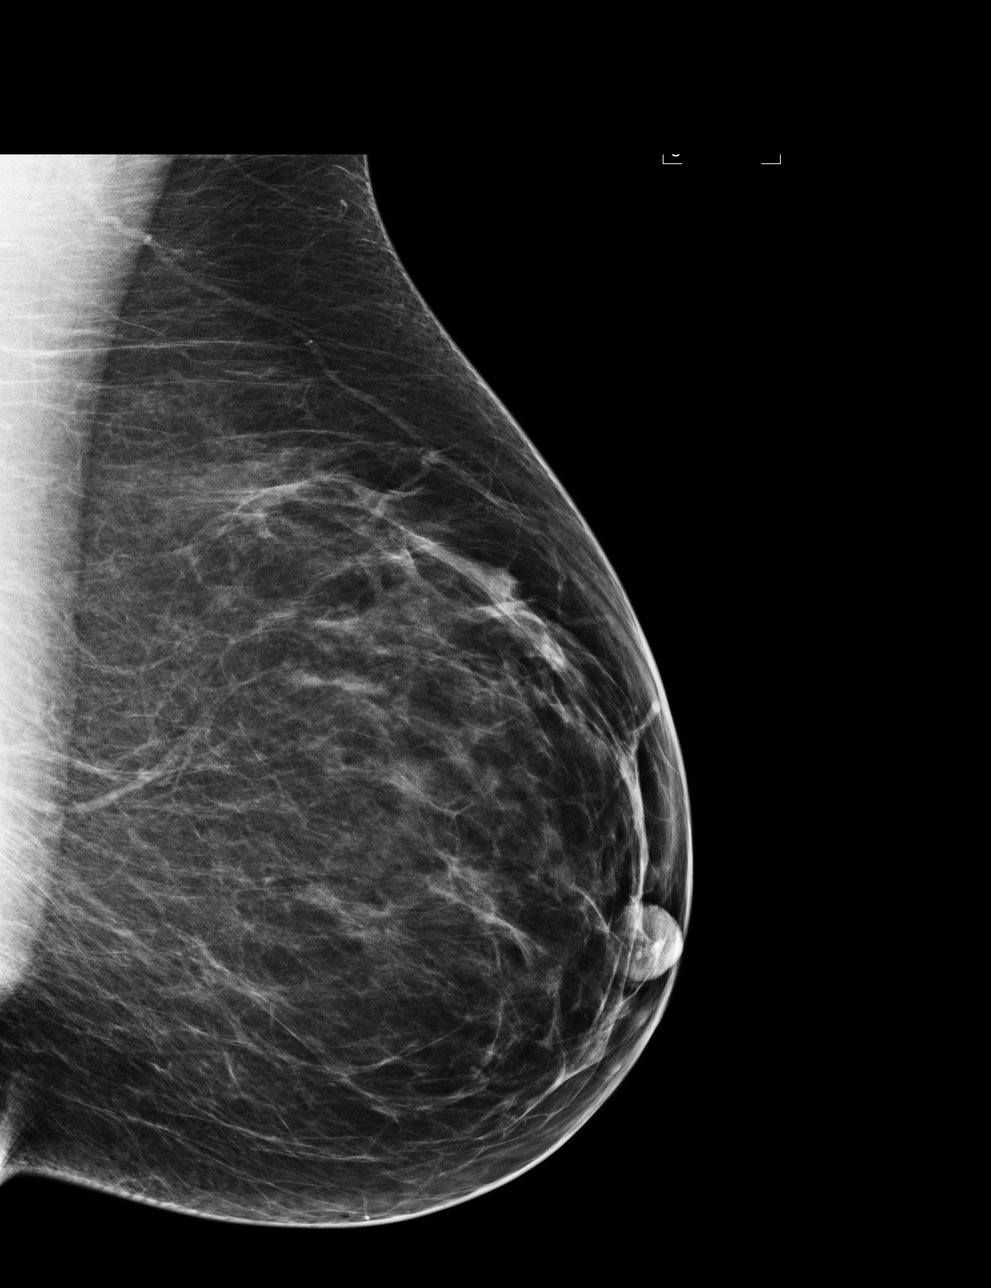

[R CC]
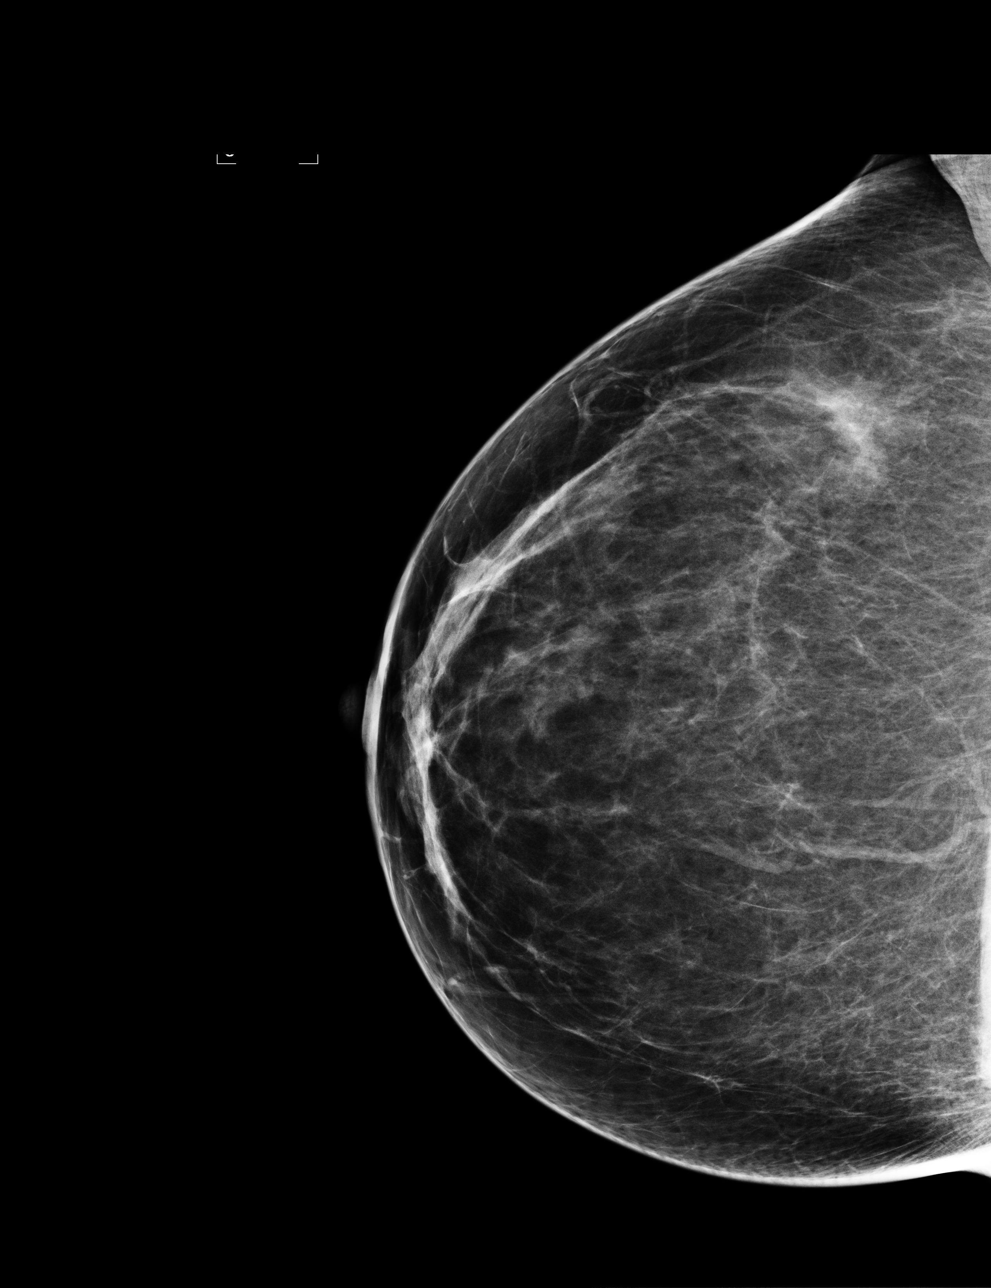

[R MLO]
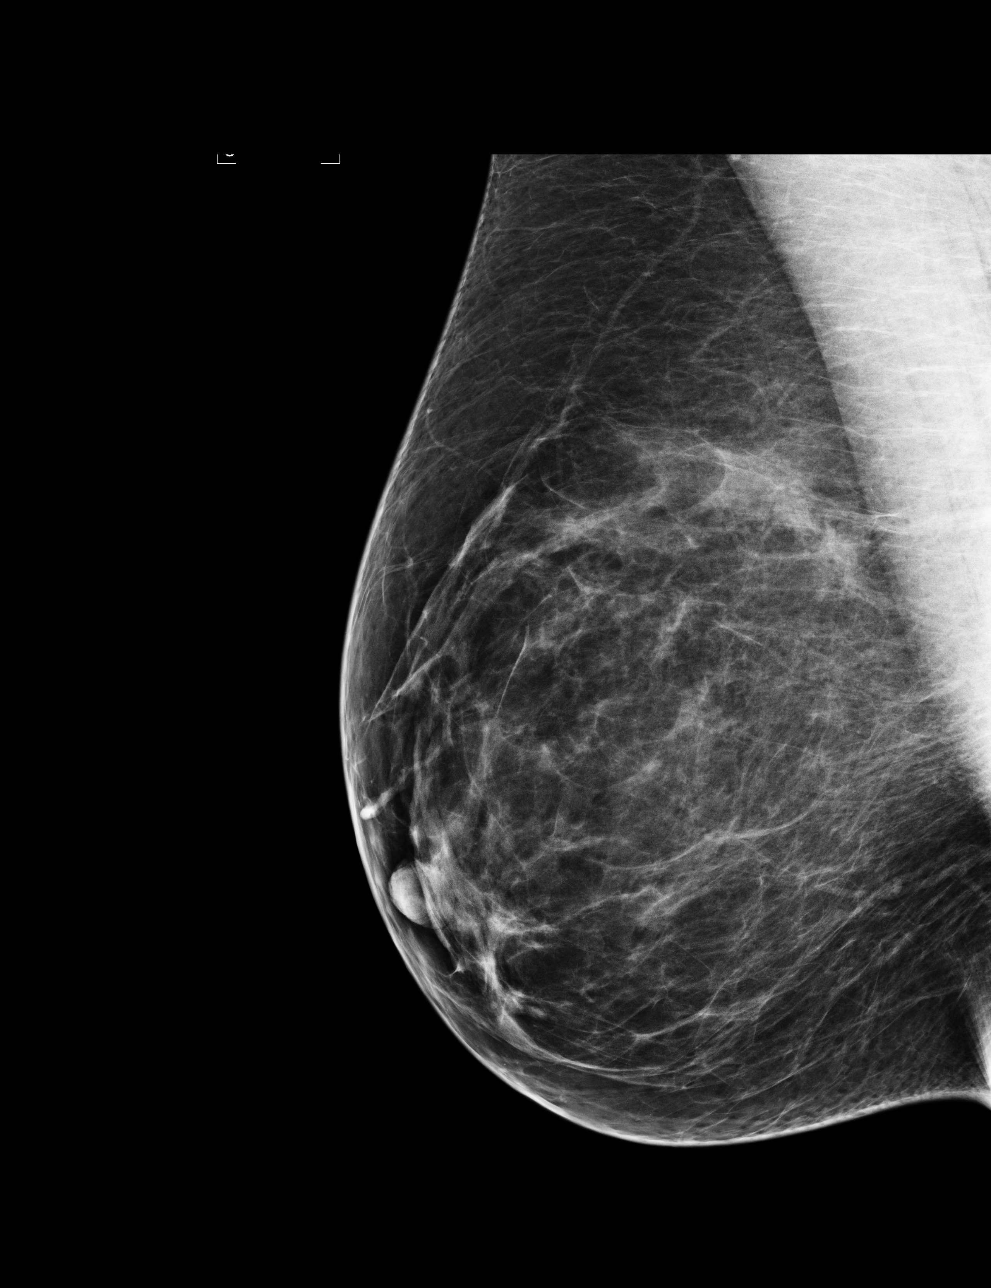

[4 of 4 positions shown; findings below may reference images not displayed]

FINDINGS: There are scattered fibroglandular densities. No
suspicious masses, architectural distortion, or calcifications are
present.

Images were processed with CAD.
IMPRESSION: No specific mammographic evidence of malignancy.

A result letter of this screening mammogram will be mailed directly
to the patient.

RECOMMENDATION:
Screening mammogram in one year. (Code:A7-G-7OZ)

BI-RADS CATEGORY 1:  Negative

## 2012-11-23 IMAGING — CR DG ELBOW COMPLETE 3+V*L*
4 series · 4 of 4 positions shown · non-contrast
Comparison: None.

CLINICAL DATA: Left lateral elbow pain, status post trauma.

LEFT ELBOW - COMPLETE 3+ VIEW

[x elbow joint ap left]
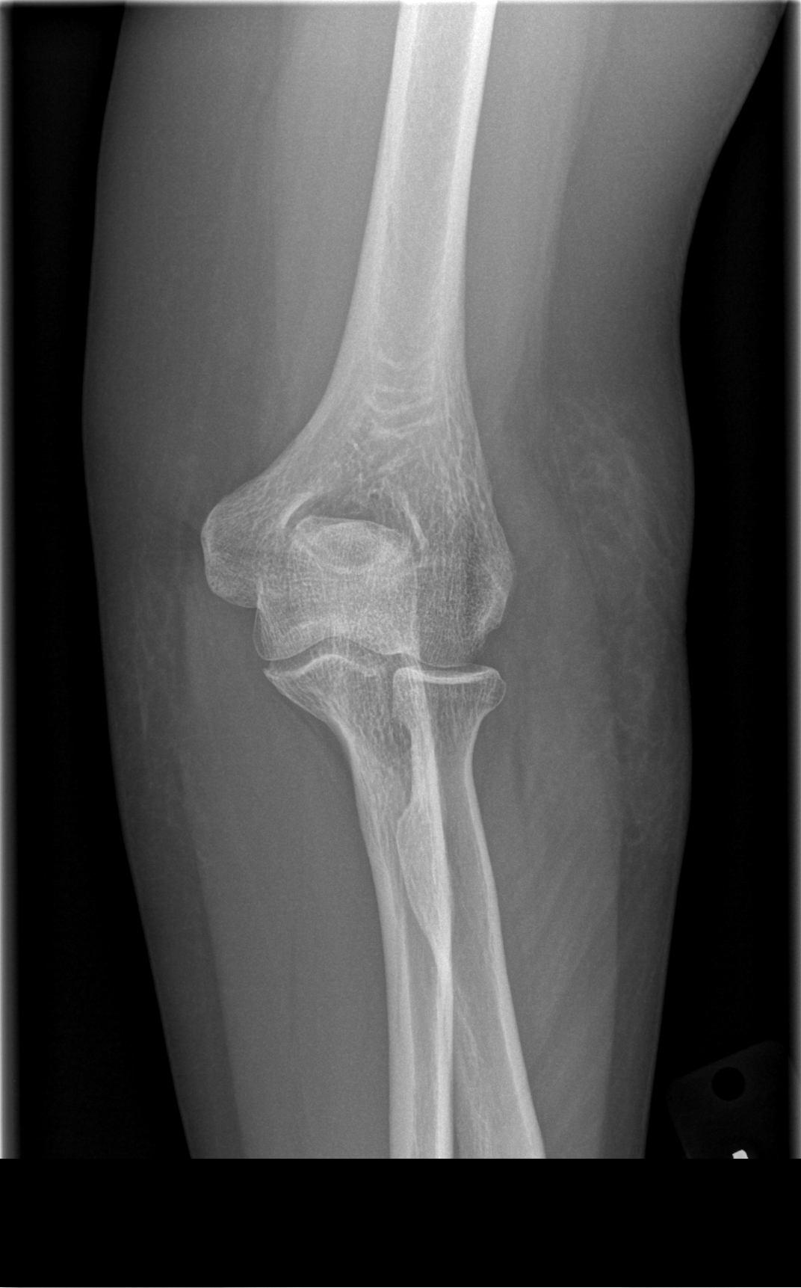

[x elbow joint obl. left (1 of 2)]
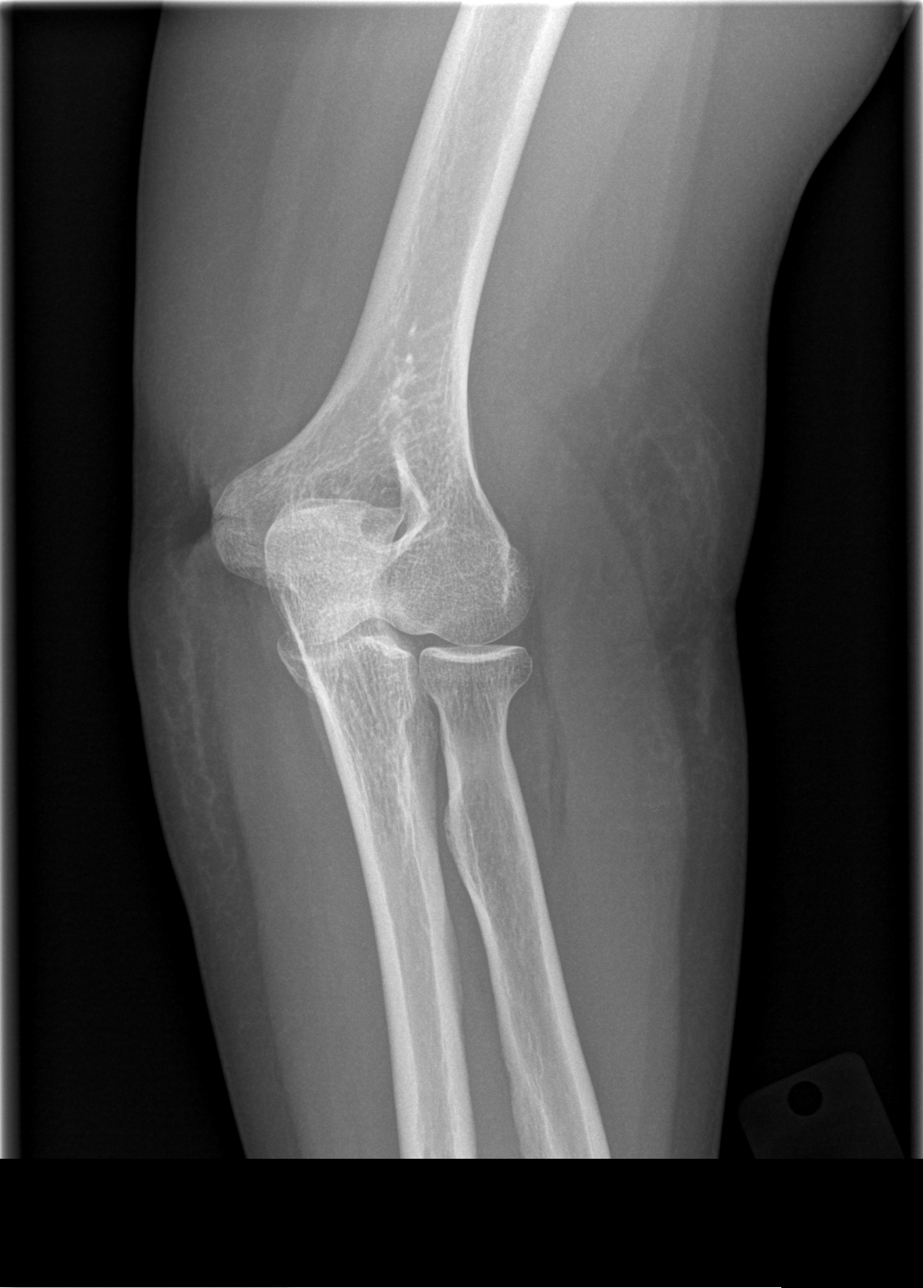

[x elbow joint obl. left (2 of 2)]
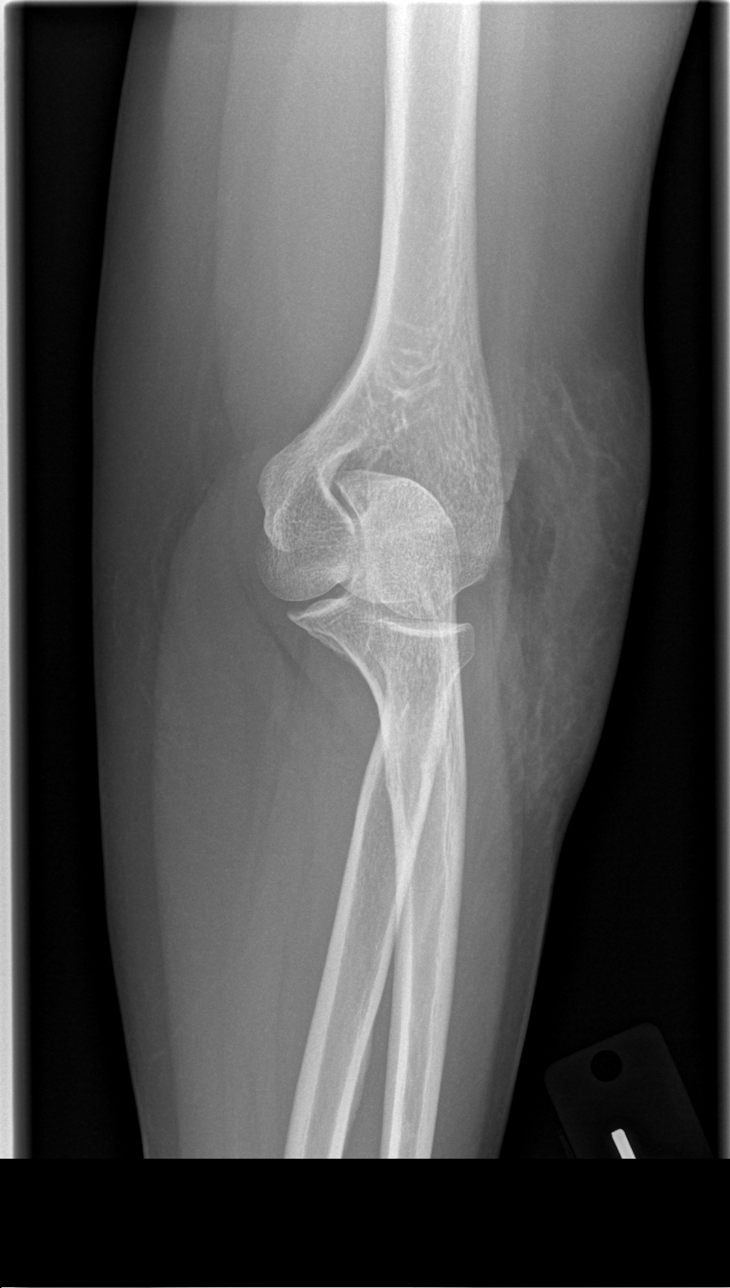

[x elbow joint lat left]
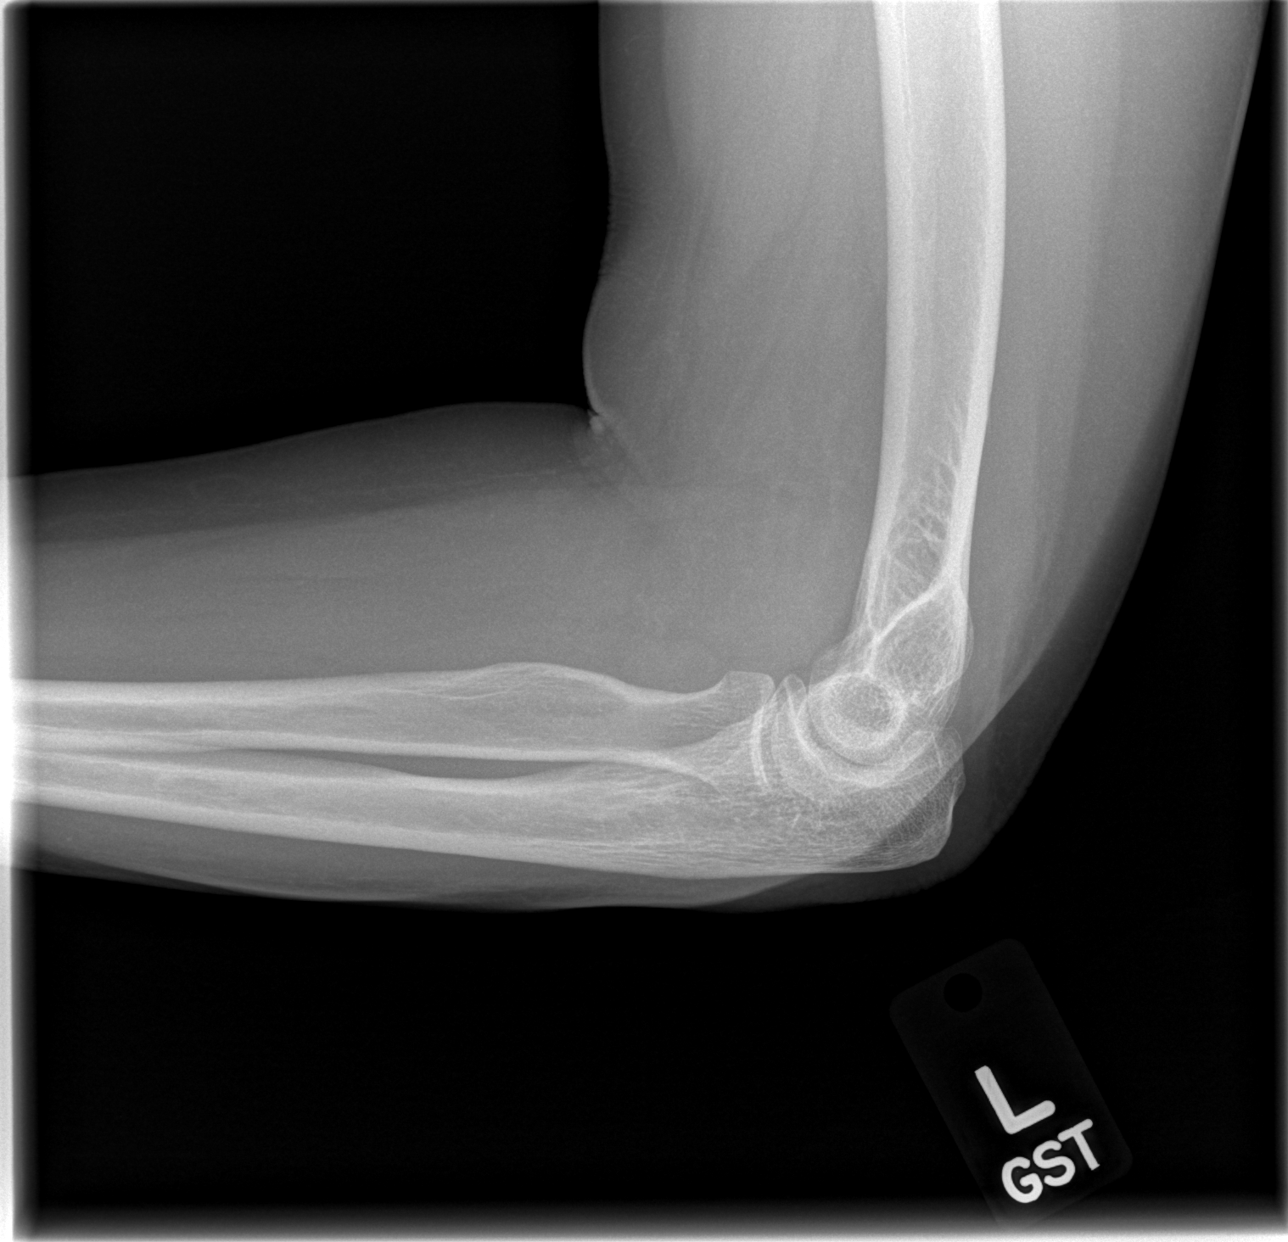

[4 of 4 positions shown; findings below may reference images not displayed]

FINDINGS: There is no evidence of fracture or dislocation.  The
visualized joint spaces are preserved.  No significant joint
effusion is identified.  The soft tissues are unremarkable in
appearance.
IMPRESSION: No evidence of fracture or dislocation.

## 2012-11-27 IMAGING — CR DG CHEST 2V
2 series · 2 of 2 positions shown · non-contrast
Comparison: 03/03/2012

CLINICAL DATA: Cough.  Shortness of breath.  Chest congestion.

CHEST - 2 VIEW

[w chest pa]
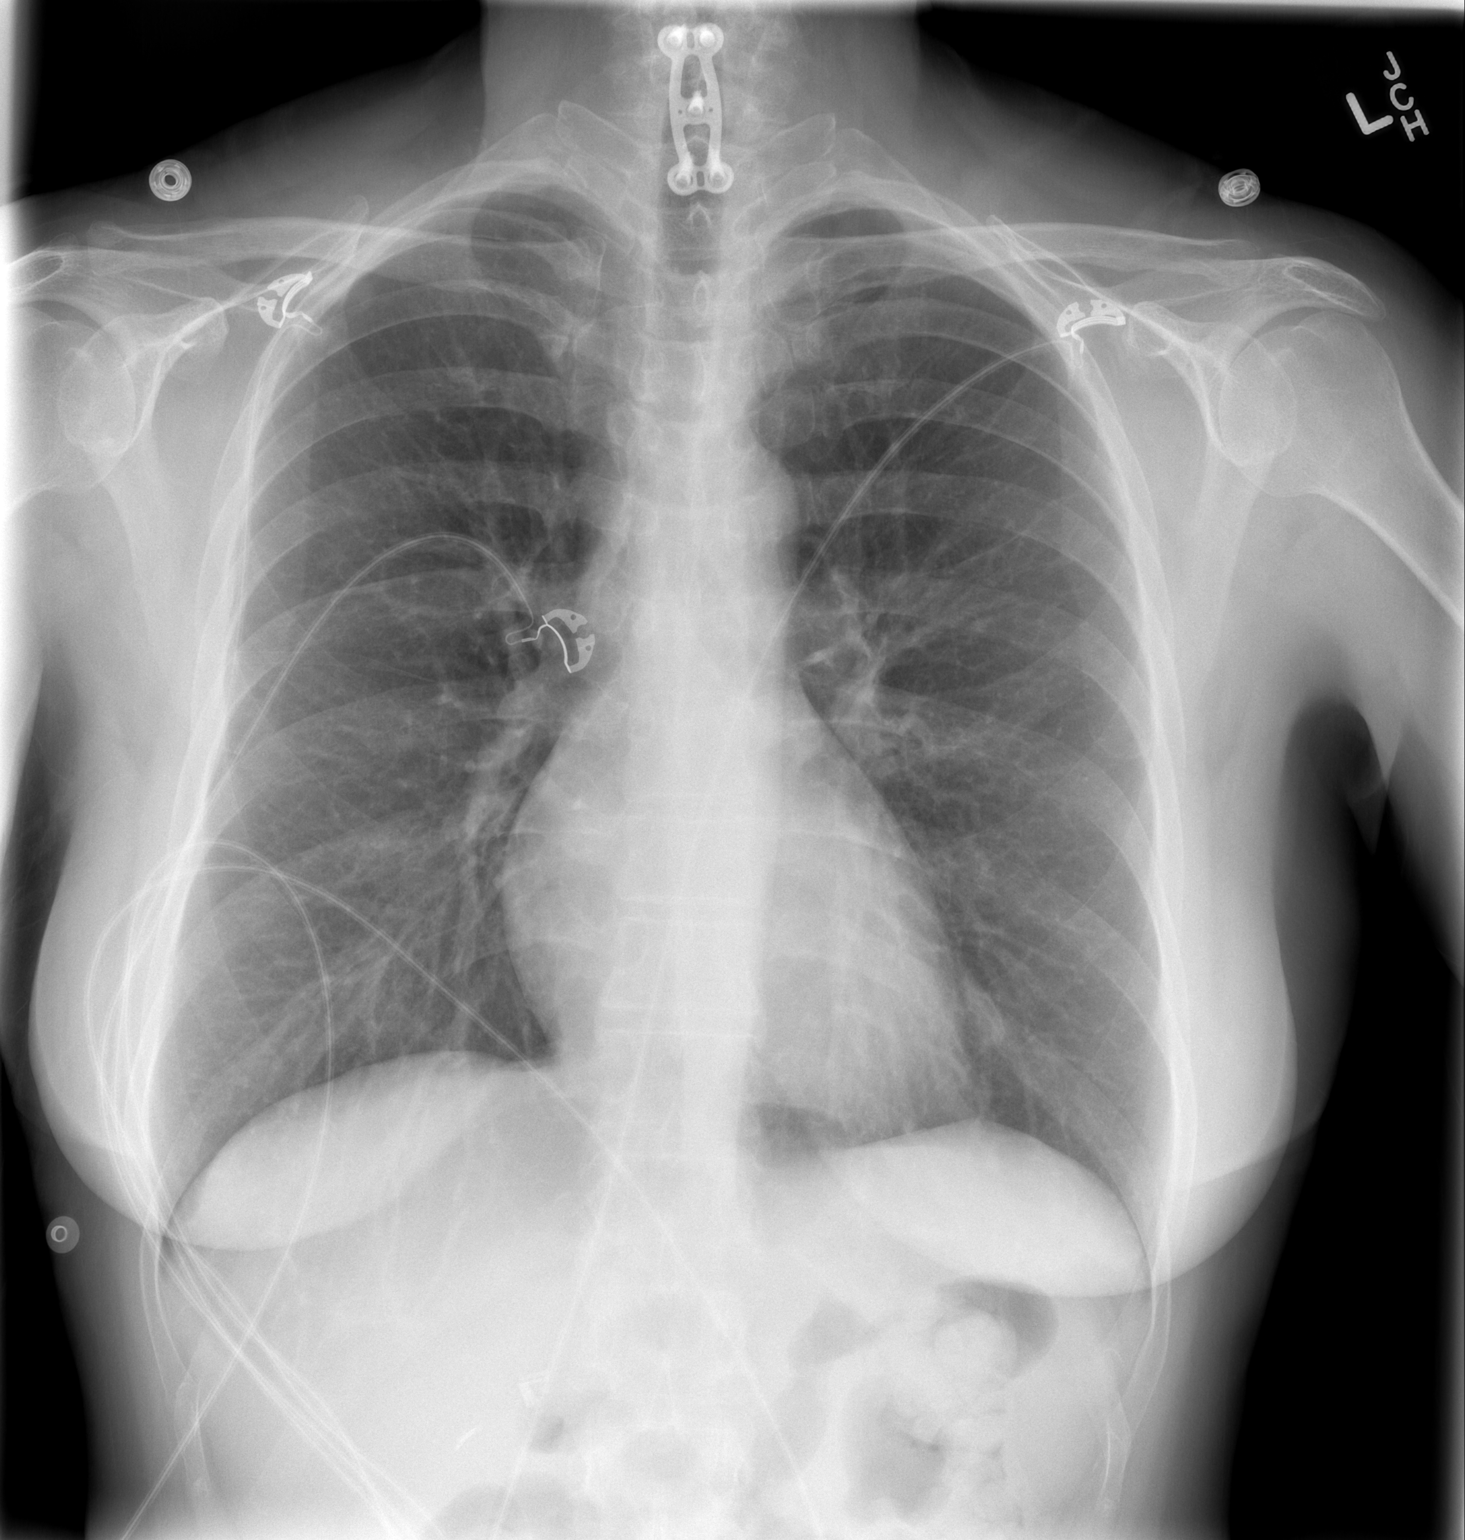

[w chest lat]
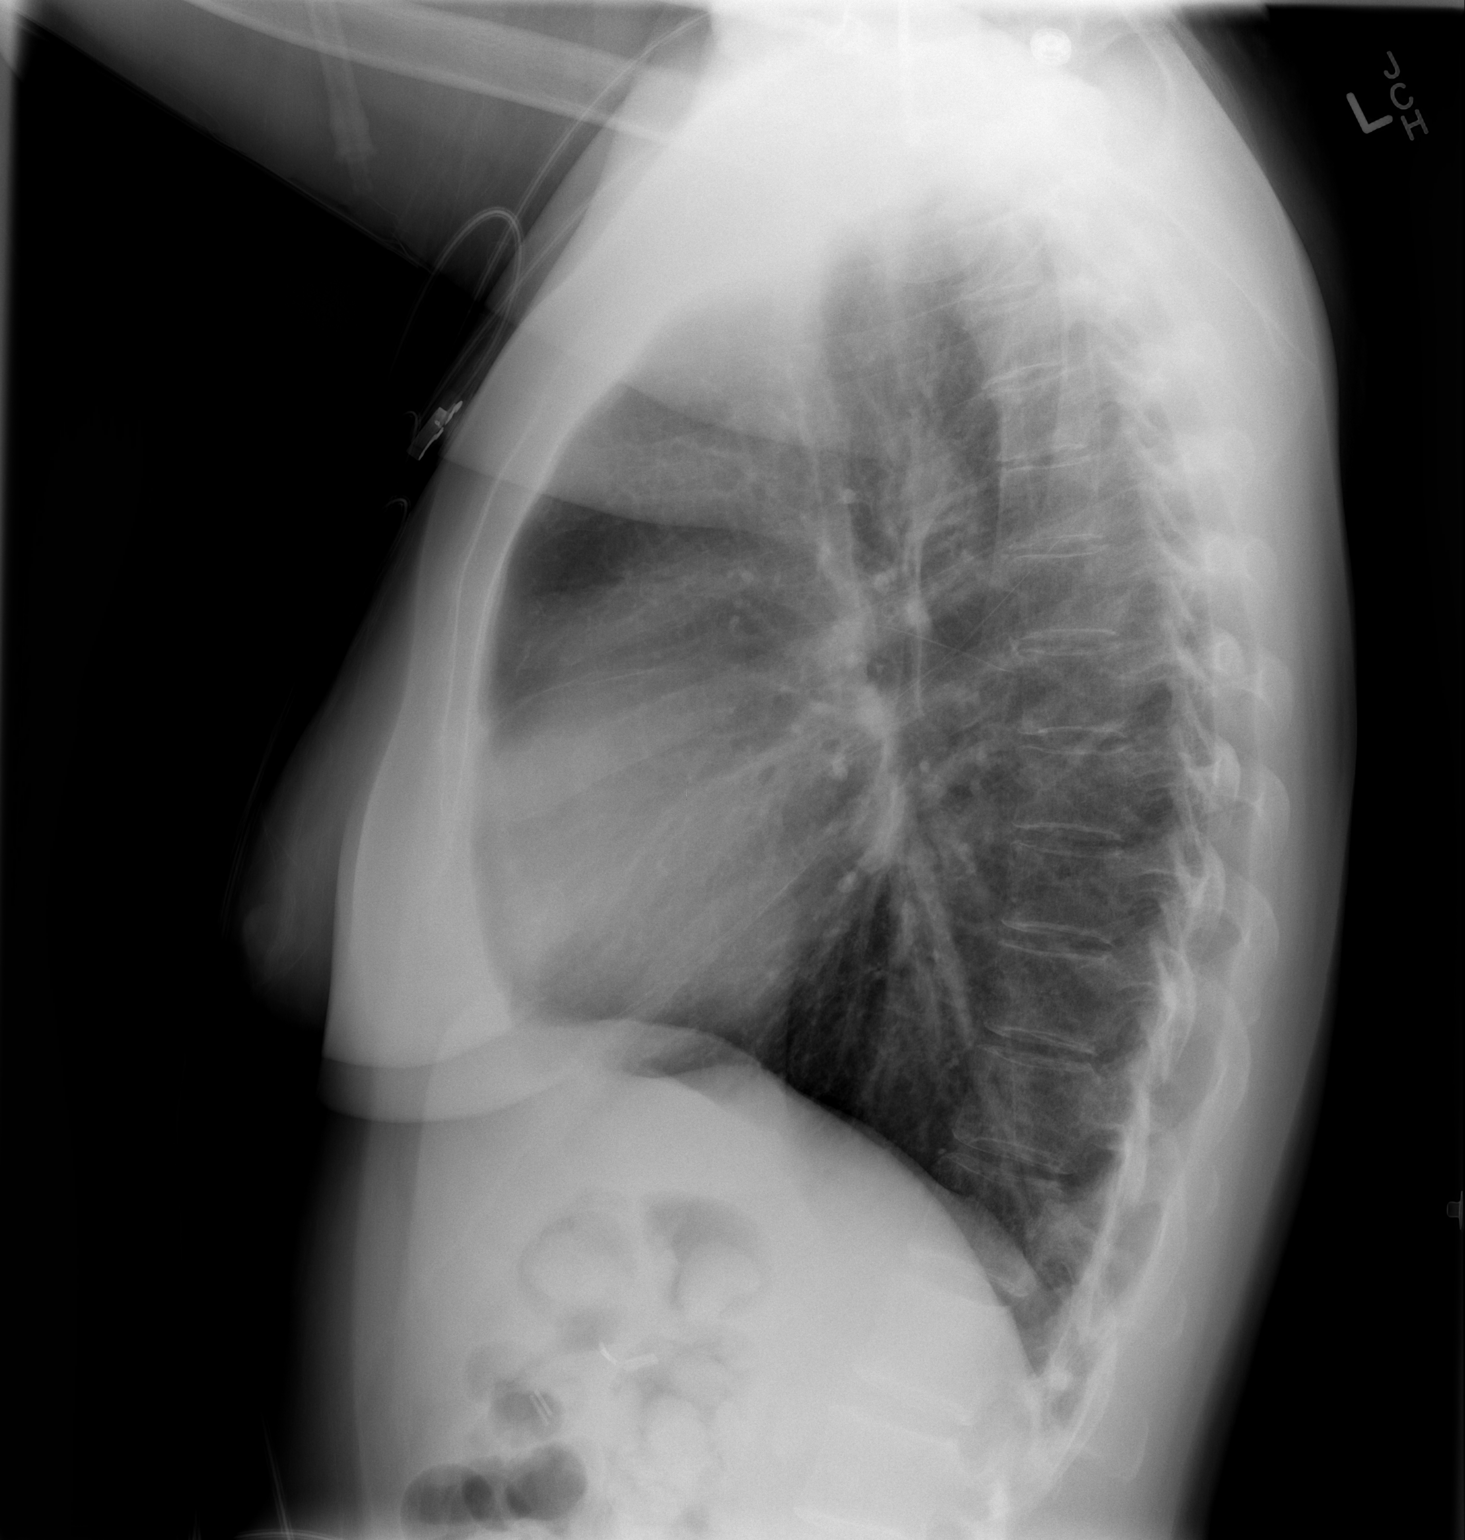

[2 of 2 positions shown; findings below may reference images not displayed]

FINDINGS: The heart size and mediastinal contours are within
normal limits.  Both lungs are clear.  The visualized skeletal
structures are unremarkable.
IMPRESSION: No active cardiopulmonary disease.

## 2012-11-28 DIAGNOSIS — J45909 Unspecified asthma, uncomplicated: Secondary | ICD-10-CM | POA: Diagnosis not present

## 2012-11-28 DIAGNOSIS — I10 Essential (primary) hypertension: Secondary | ICD-10-CM | POA: Diagnosis not present

## 2012-11-28 DIAGNOSIS — G43909 Migraine, unspecified, not intractable, without status migrainosus: Secondary | ICD-10-CM | POA: Diagnosis not present

## 2012-11-28 DIAGNOSIS — Z6829 Body mass index (BMI) 29.0-29.9, adult: Secondary | ICD-10-CM | POA: Diagnosis not present

## 2012-12-08 IMAGING — CR DG CHEST 2V
2 series · 2 of 2 positions shown · non-contrast
Comparison: 03/18/2012 and earlier.

CLINICAL DATA: 46-year-old female with chest pain cough nausea
headache.

CHEST - 2 VIEW

[w chest pa]
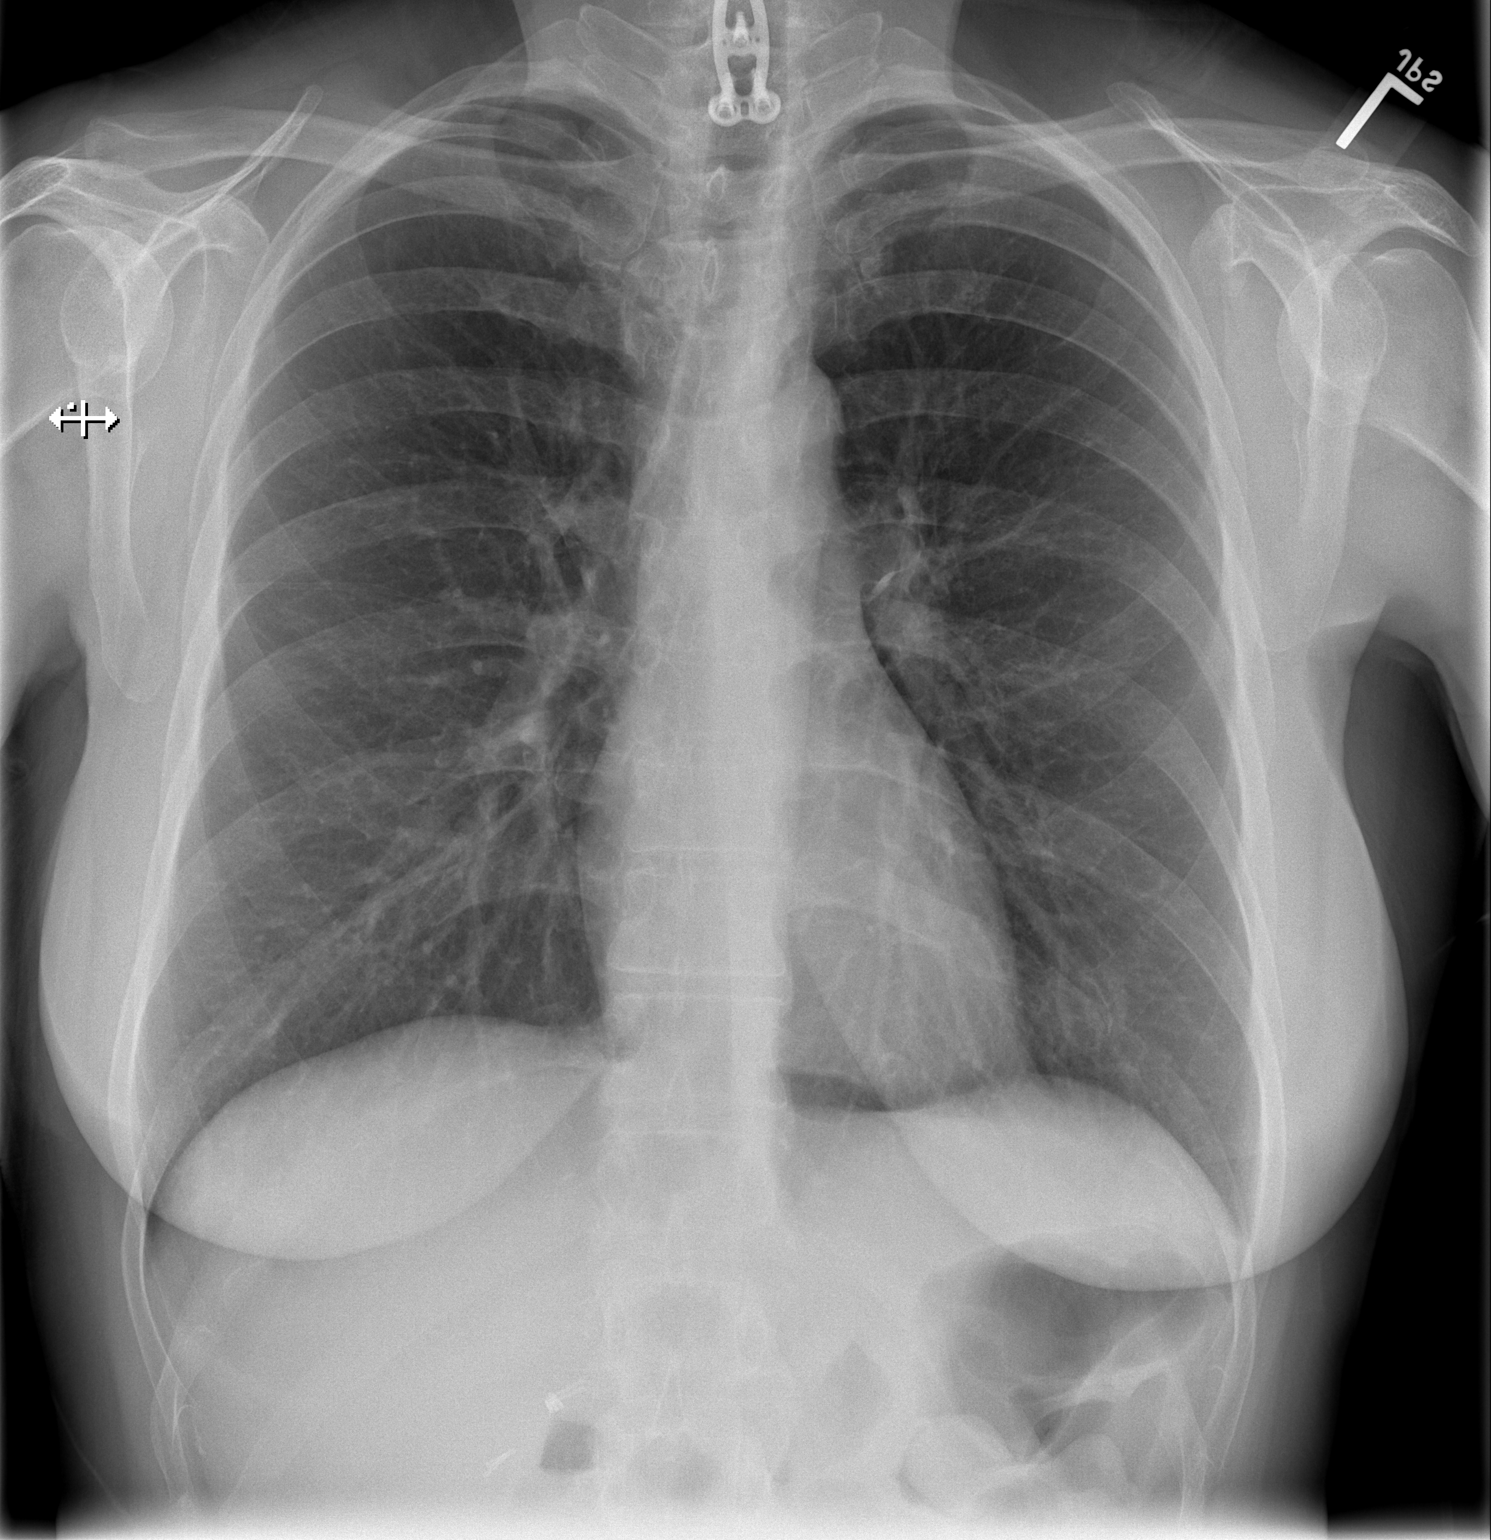

[w chest lat]
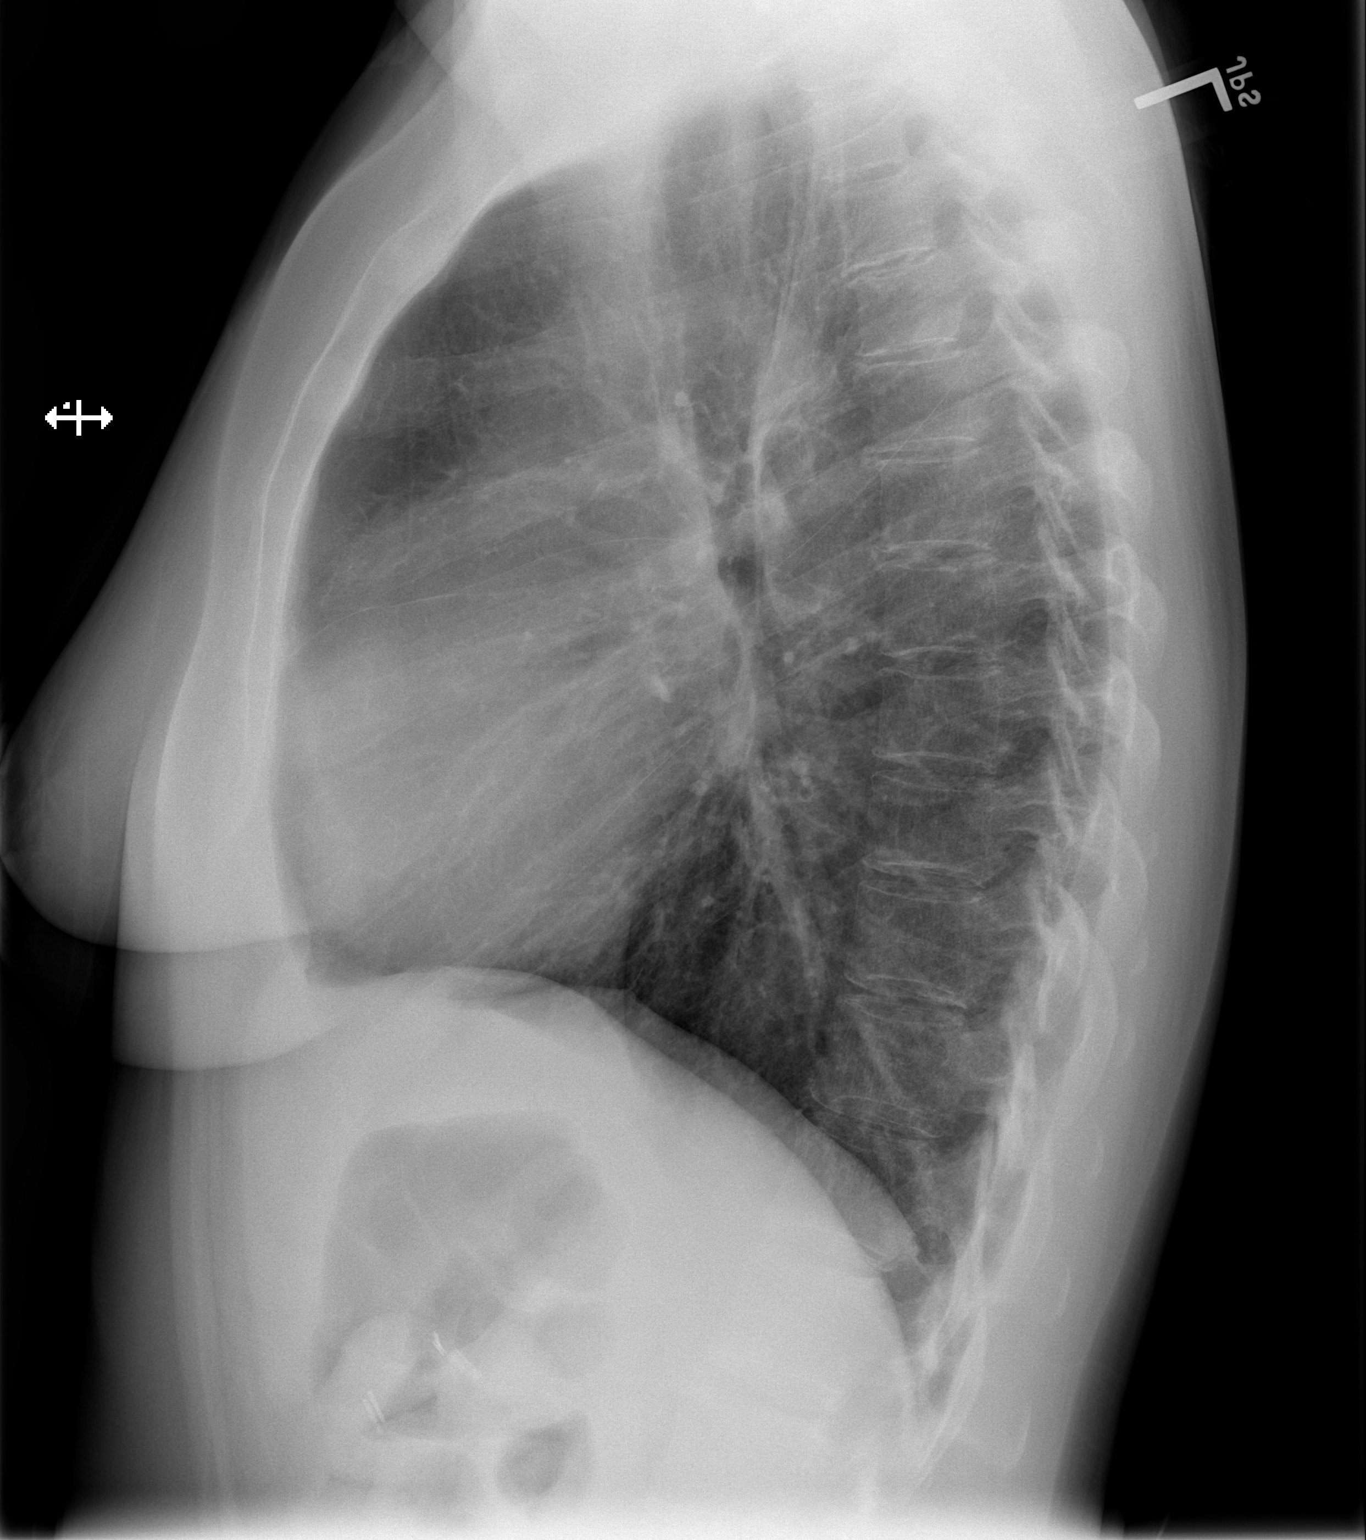

[2 of 2 positions shown; findings below may reference images not displayed]

FINDINGS: Lung volumes are stable and within normal limits.
Cardiac size and mediastinal contours are within normal limits.
Visualized tracheal air column is within normal limits.  Lung
parenchyma is stable with mild increased interstitial markings.  No
pneumothorax, pulmonary edema, pleural effusion or confluent
pulmonary opacity.  Stable right upper quadrant surgical clips. No
acute osseous abnormality identified.    Cervical ACDF hardware
again noted.
IMPRESSION: No acute cardiopulmonary abnormality.

## 2012-12-26 IMAGING — CT CT HEAD W/O CM
1 of 2 series · 13 of 30 positions shown, 17 images · non-contrast
Comparison: 06/29/2010 and 02/12/2010

CLINICAL DATA: Nausea and vomiting.  Headache.

CT HEAD WITHOUT CONTRAST
TECHNIQUE: Contiguous axial images were obtained from the base of
the skull through the vertex without contrast.

[Series 2: brain · axial · 0.49mm/px · z∈[+109,+242]mm · 13 of 32 slices shown, 17 images]
[im 3/32  brain]
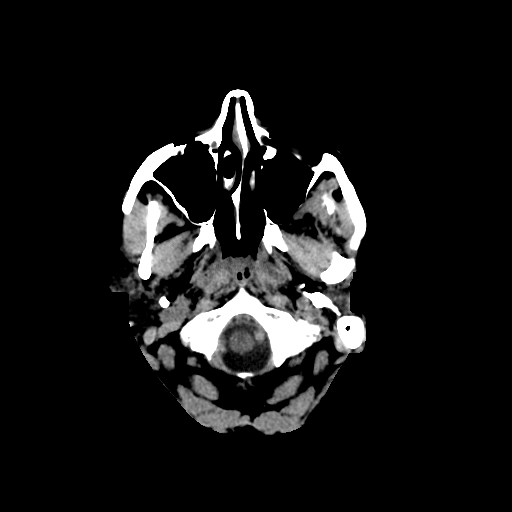
[im 3/32  bone]
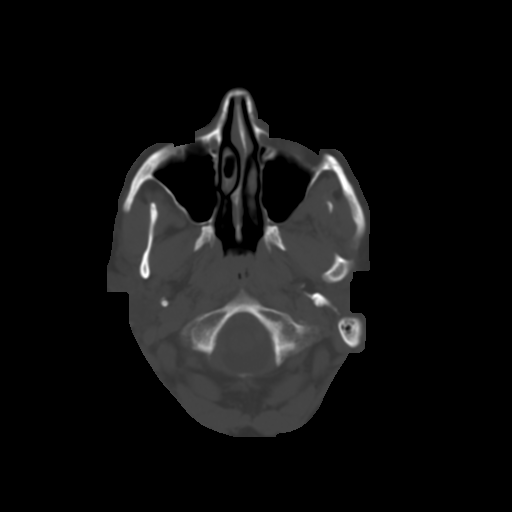
[im 5/32  brain]
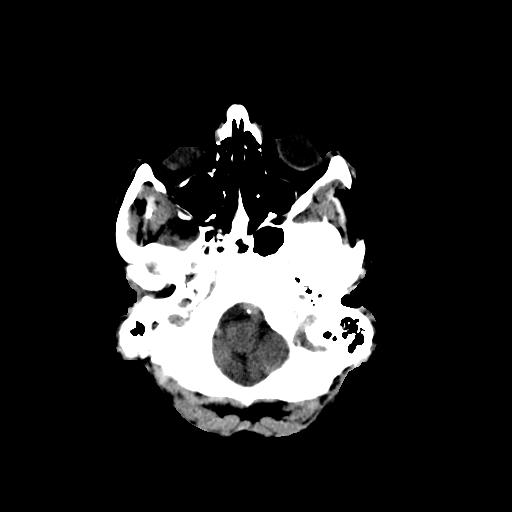
[im 7/32  brain]
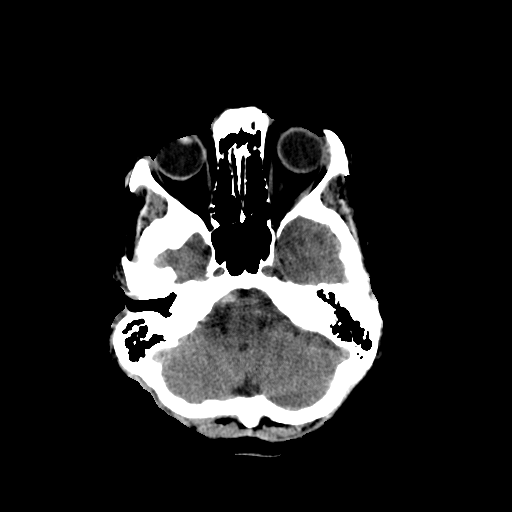
[im 9/32  brain]
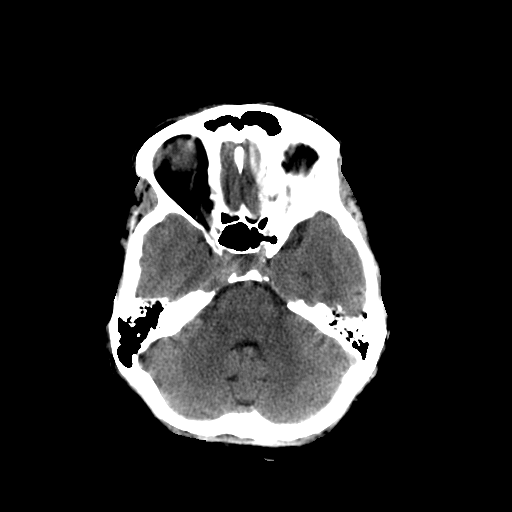
[im 12/32  brain]
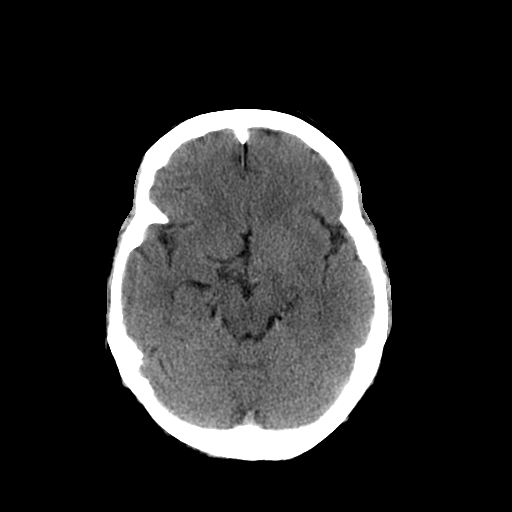
[im 12/32  bone]
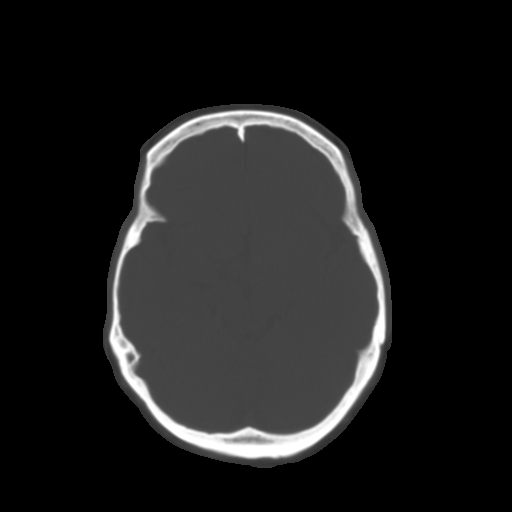
[im 14/32  brain]
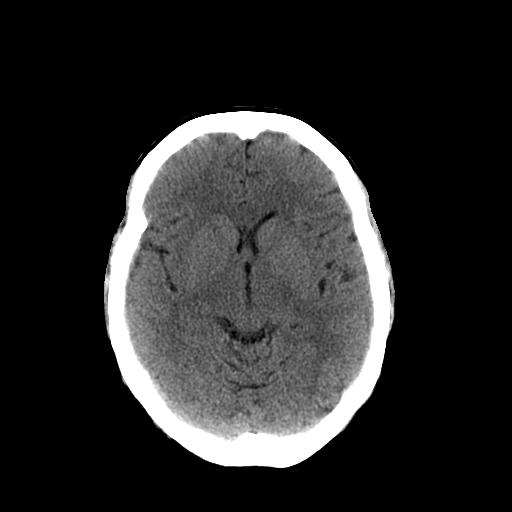
[im 16/32  brain]
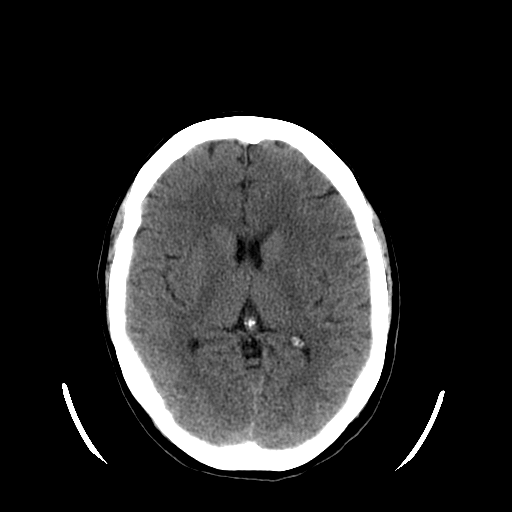
[im 18/32  brain]
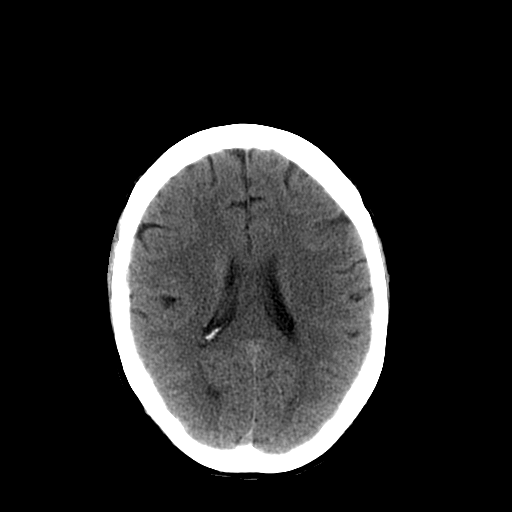
[im 20/32  brain]
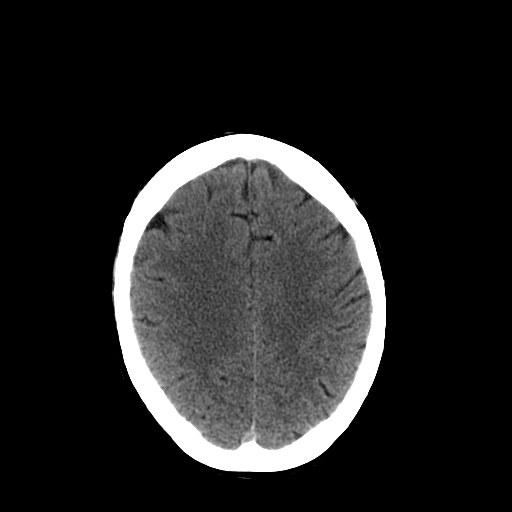
[im 20/32  bone]
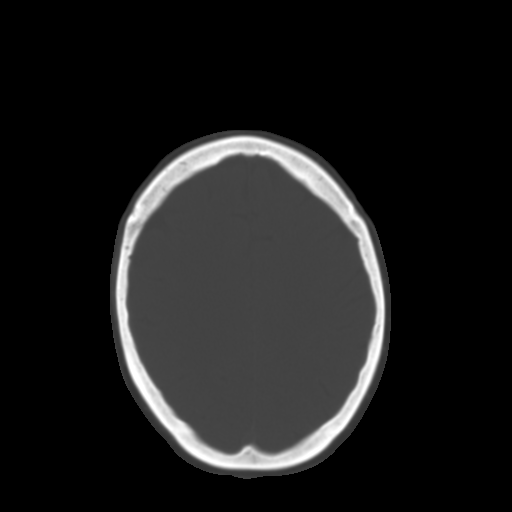
[im 23/32  brain]
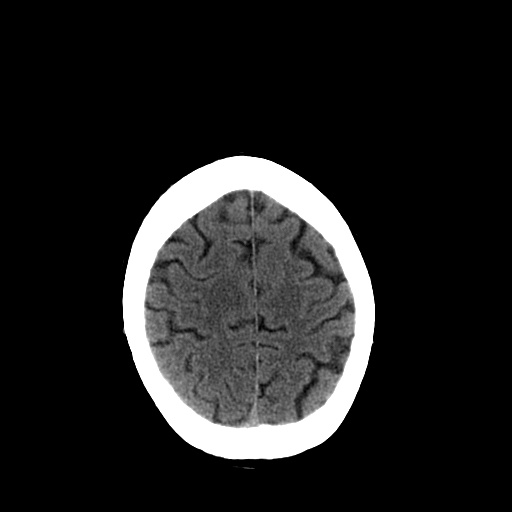
[im 25/32  brain]
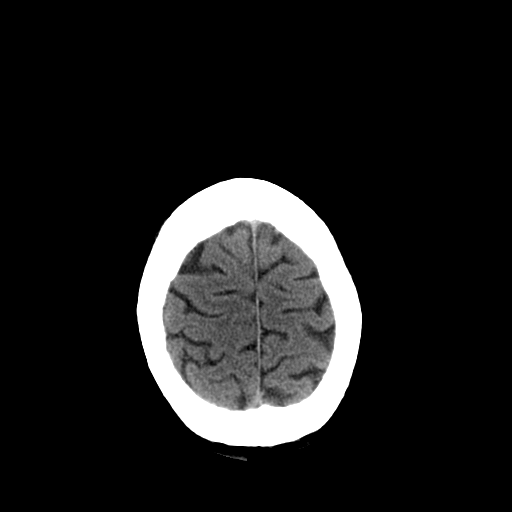
[im 27/32  brain]
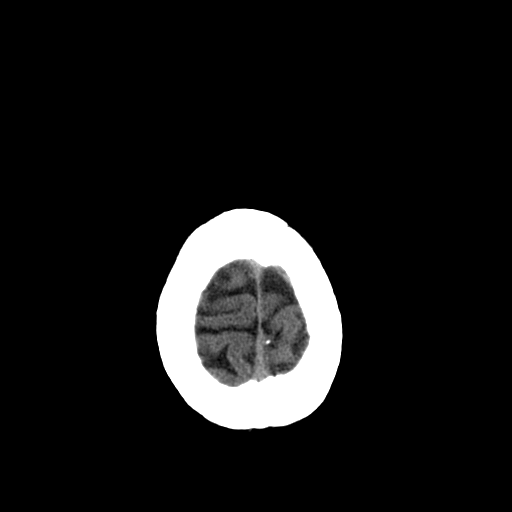
[im 29/32  brain]
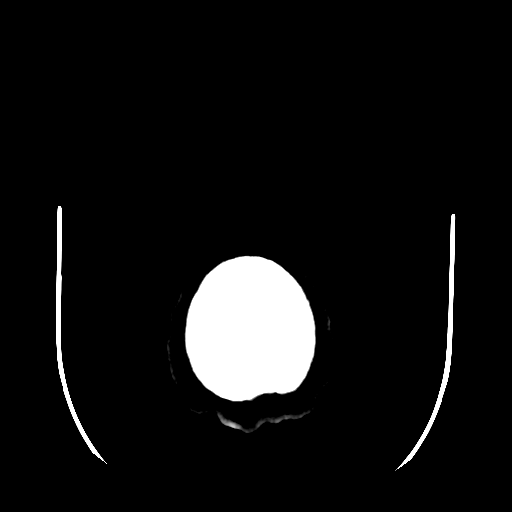
[im 29/32  bone]
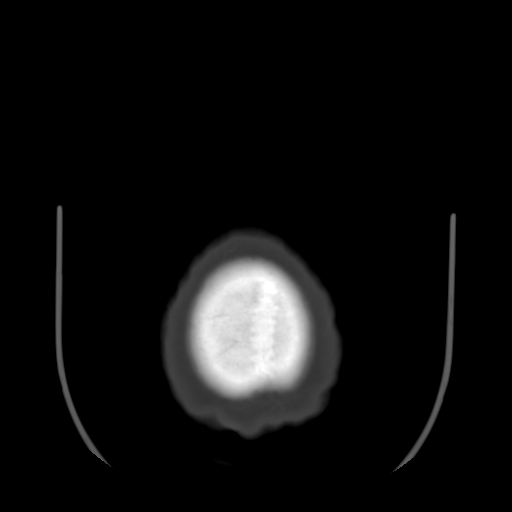

[13 of 30 positions shown; findings below may reference images not displayed]

FINDINGS: The brain stem, cerebellum, cerebral peduncles, thalami,
basal ganglia, basilar cisterns, and ventricular system appear
unremarkable.

No intracranial hemorrhage, mass lesion, or acute infarction is
identified.

The visualized paranasal sinuses appear clear.
IMPRESSION: No significant abnormality identified.

## 2012-12-26 IMAGING — CR DG CHEST 1V PORT
1 series · 1 of 1 positions shown · non-contrast
Comparison: Two-view chest x-ray 03/29/2012, 03/18/2012.  CTA chest
03/03/2012.

CLINICAL DATA: Chest pain.  History of Addison's disease.

PORTABLE CHEST - 1 VIEW [DATE]/6826 2826 hours:

[AP]
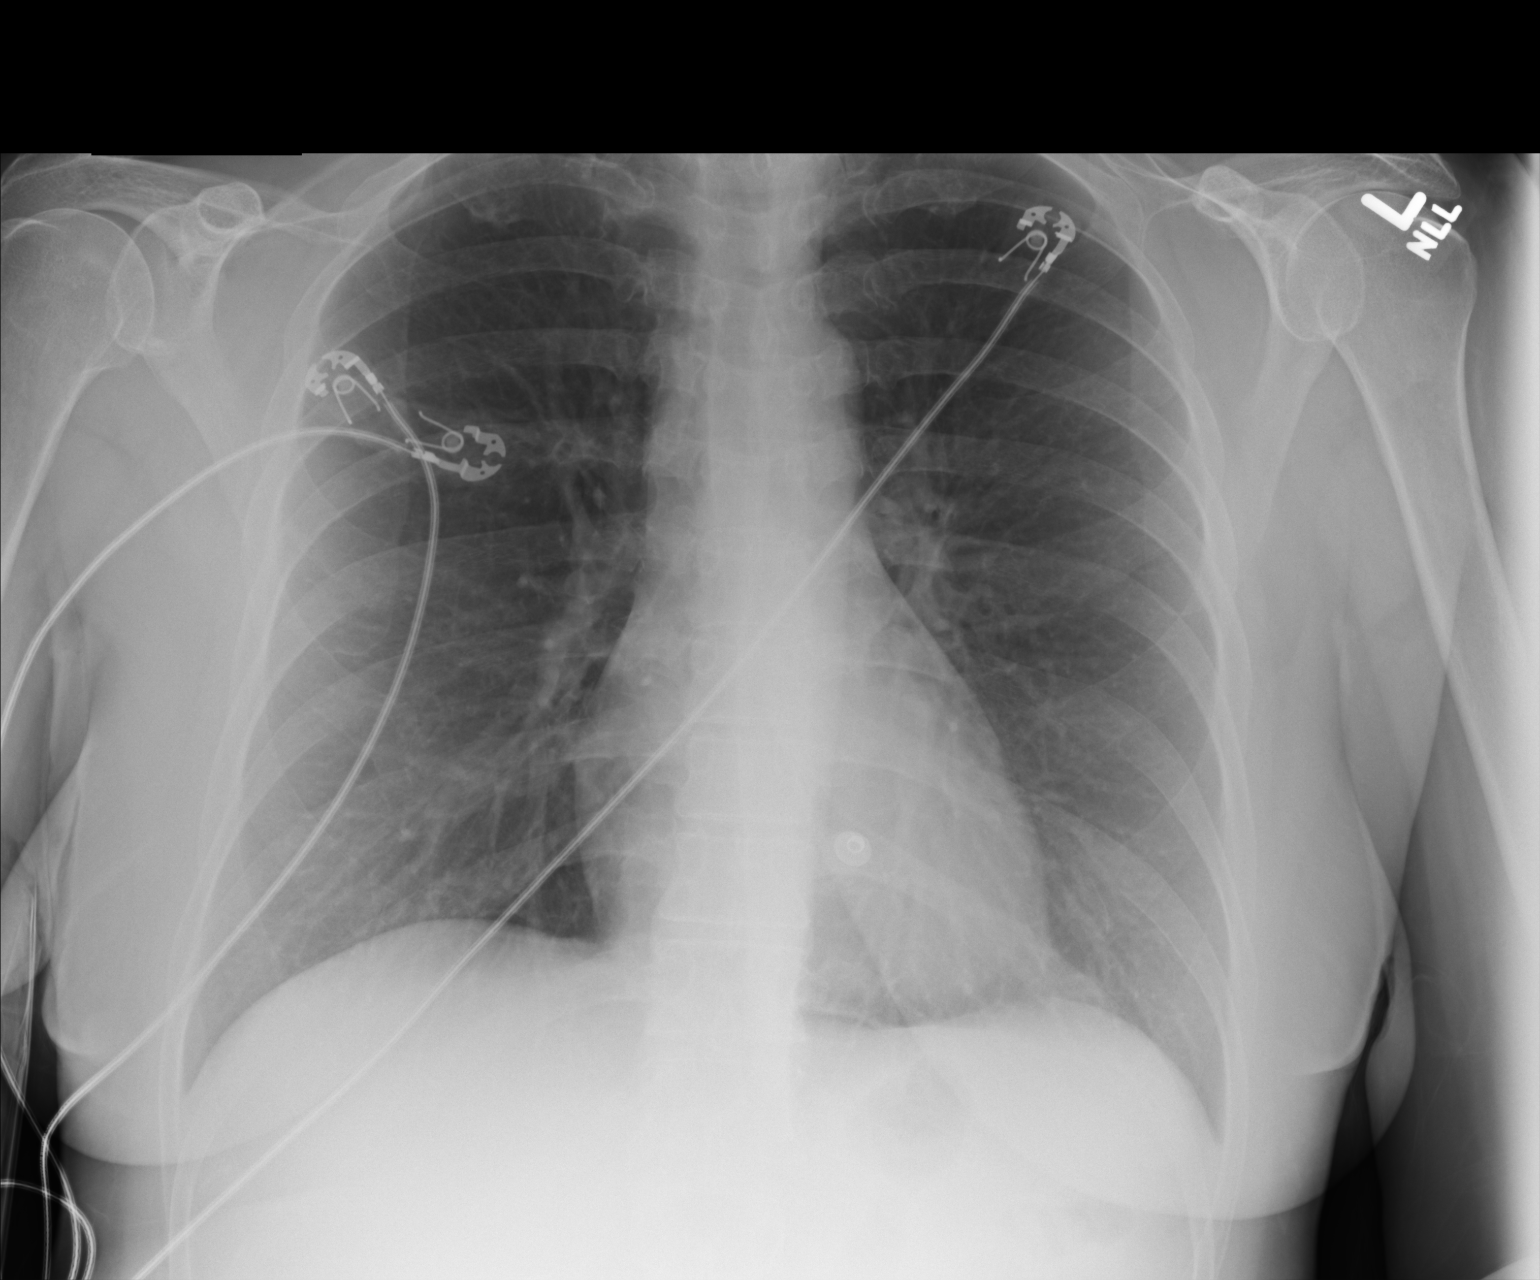

[1 of 1 positions shown; findings below may reference images not displayed]

FINDINGS: Cardiac silhouette normal and mediastinal contours
unremarkable for the AP portable technique.  Lungs clear.
Pulmonary vascularity normal.  Bronchovascular markings normal.  No
pneumothorax.  No pleural effusions.
IMPRESSION: No acute cardiopulmonary disease.

## 2012-12-31 ENCOUNTER — Emergency Department (HOSPITAL_COMMUNITY): Payer: Medicaid Other

## 2012-12-31 ENCOUNTER — Emergency Department (HOSPITAL_COMMUNITY)
Admission: EM | Admit: 2012-12-31 | Discharge: 2012-12-31 | Discharge status: Against Medical Advice | Payer: Medicaid Other | Attending: Emergency Medicine | Admitting: Emergency Medicine

## 2012-12-31 ENCOUNTER — Encounter (HOSPITAL_COMMUNITY): Payer: Self-pay | Admitting: Emergency Medicine

## 2012-12-31 DIAGNOSIS — Z8639 Personal history of other endocrine, nutritional and metabolic disease: Secondary | ICD-10-CM | POA: Insufficient documentation

## 2012-12-31 DIAGNOSIS — M545 Low back pain, unspecified: Secondary | ICD-10-CM | POA: Insufficient documentation

## 2012-12-31 DIAGNOSIS — Z862 Personal history of diseases of the blood and blood-forming organs and certain disorders involving the immune mechanism: Secondary | ICD-10-CM | POA: Insufficient documentation

## 2012-12-31 DIAGNOSIS — M542 Cervicalgia: Secondary | ICD-10-CM

## 2012-12-31 DIAGNOSIS — Z79899 Other long term (current) drug therapy: Secondary | ICD-10-CM | POA: Insufficient documentation

## 2012-12-31 DIAGNOSIS — I2510 Atherosclerotic heart disease of native coronary artery without angina pectoris: Secondary | ICD-10-CM | POA: Insufficient documentation

## 2012-12-31 DIAGNOSIS — E039 Hypothyroidism, unspecified: Secondary | ICD-10-CM | POA: Insufficient documentation

## 2012-12-31 DIAGNOSIS — M2550 Pain in unspecified joint: Secondary | ICD-10-CM | POA: Insufficient documentation

## 2012-12-31 DIAGNOSIS — Z880 Allergy status to penicillin: Secondary | ICD-10-CM | POA: Insufficient documentation

## 2012-12-31 DIAGNOSIS — F17200 Nicotine dependence, unspecified, uncomplicated: Secondary | ICD-10-CM | POA: Insufficient documentation

## 2012-12-31 DIAGNOSIS — Z9861 Coronary angioplasty status: Secondary | ICD-10-CM | POA: Insufficient documentation

## 2012-12-31 DIAGNOSIS — Z8679 Personal history of other diseases of the circulatory system: Secondary | ICD-10-CM | POA: Insufficient documentation

## 2012-12-31 DIAGNOSIS — G8929 Other chronic pain: Secondary | ICD-10-CM | POA: Insufficient documentation

## 2012-12-31 DIAGNOSIS — Z8719 Personal history of other diseases of the digestive system: Secondary | ICD-10-CM | POA: Insufficient documentation

## 2012-12-31 DIAGNOSIS — I10 Essential (primary) hypertension: Secondary | ICD-10-CM | POA: Diagnosis not present

## 2012-12-31 DIAGNOSIS — R209 Unspecified disturbances of skin sensation: Secondary | ICD-10-CM | POA: Insufficient documentation

## 2012-12-31 DIAGNOSIS — F411 Generalized anxiety disorder: Secondary | ICD-10-CM | POA: Insufficient documentation

## 2012-12-31 DIAGNOSIS — Z8669 Personal history of other diseases of the nervous system and sense organs: Secondary | ICD-10-CM | POA: Insufficient documentation

## 2012-12-31 DIAGNOSIS — R112 Nausea with vomiting, unspecified: Secondary | ICD-10-CM | POA: Insufficient documentation

## 2012-12-31 DIAGNOSIS — I509 Heart failure, unspecified: Secondary | ICD-10-CM | POA: Insufficient documentation

## 2012-12-31 LAB — POCT I-STAT, CHEM 8
BUN: 10 mg/dL (ref 6–23)
Calcium, Ion: 0.98 mmol/L — ABNORMAL LOW (ref 1.12–1.23)
Chloride: 104 mEq/L (ref 96–112)
Creatinine, Ser: 1.3 mg/dL — ABNORMAL HIGH (ref 0.50–1.10)
Glucose, Bld: 97 mg/dL (ref 70–99)

## 2012-12-31 LAB — RAPID URINE DRUG SCREEN, HOSP PERFORMED
Amphetamines: NOT DETECTED
Barbiturates: NOT DETECTED
Benzodiazepines: POSITIVE — AB
Cocaine: NOT DETECTED
Opiates: NOT DETECTED
Tetrahydrocannabinol: NOT DETECTED

## 2012-12-31 LAB — PRO B NATRIURETIC PEPTIDE: Pro B Natriuretic peptide (BNP): 421.2 pg/mL — ABNORMAL HIGH (ref 0–125)

## 2012-12-31 MED ORDER — NALOXONE HCL 0.4 MG/ML IJ SOLN
0.2000 mg | Freq: Once | INTRAMUSCULAR | Status: AC
  Administered 2012-12-31: 0.2 mg via INTRAVENOUS
  Filled 2012-12-31: qty 1

## 2012-12-31 MED ORDER — PROMETHAZINE HCL 25 MG RE SUPP: 25.0000 mg | Freq: Four times a day (QID) | RECTAL | Status: DC | PRN

## 2012-12-31 MED ORDER — SODIUM CHLORIDE 0.9 % IV SOLN
1000.0000 mL | Freq: Once | INTRAVENOUS | Status: AC
  Administered 2012-12-31: 1000 mL via INTRAVENOUS

## 2012-12-31 MED ORDER — PROMETHAZINE HCL 25 MG/ML IJ SOLN
12.5000 mg | Freq: Once | INTRAMUSCULAR | Status: AC
  Administered 2012-12-31: 12.5 mg via INTRAVENOUS
  Filled 2012-12-31: qty 1

## 2012-12-31 NOTE — ED Provider Notes (Addendum)
Medical screening examination/treatment/procedure(s) were conducted as a shared visit with non-physician practitioner(s) and myself.  I personally evaluated the patient during the encounter  The patient complains of chronic neck back and right hip pain.  She states that her last pain medicine was at 4 AM.  She came the emergency department for evaluation of her chronic pain.  She has home pain medications.  While in triage the patient was noted to be somewhat somnolent with a borderline blood pressure and a borderline oxygen saturation.  She was brought back for resuscitation bay.  The patient is awake her blood pressure is around 100 systolic and her oxygen saturations are 92%.  She does seem to fall asleep rather easily.  She does not have pinpoint pupils.  She does drop her oxygen saturations down into the high 70s and awake herself back up and no rise back up into the 80s.  Some of this may represent obstructive sleep apnea.  The patient was given Phenergan in the emergency department by my mid-level provider.  She also taken some Ativan prior to arrival.  0.2 mg of Narcan was given without improvement in her symptoms.  I don't really feel comfortable giving more than this otherwise we'll put this patient is a narcotic withdrawal.  I suspect much of this is benzodiazepine and Finnigan related.  The patient be monitored in the emergency department.  Labs x-ray and urine pending at this time.  We'll need to monitor the patient.  I suspect the patient will go home after monitoring in the emergency department.    Date: 12/31/2012  Rate: 83  Rhythm: normal sinus rhythm  QRS Axis: normal  Intervals: normal  ST/T Wave abnormalities: normal  Conduction Disutrbances: none  Narrative Interpretation:   Old EKG Reviewed: No significant changes noted    Lyanne Co, MD 12/31/12 1547  Lyanne Co, MD 12/31/12 1549  Lyanne Co, MD 12/31/12 [phone]

## 2012-12-31 NOTE — ED Provider Notes (Signed)
History     CSN: [id number]  Arrival date & time 12/31/12  1003   First MD Initiated Contact with Patient 12/31/12 1124      Chief Complaint  Patient presents with  . Pain    (Consider location/radiation/quality/duration/timing/severity/associated sxs/prior treatment) Patient is a 47 y.o. female presenting with back pain. The history is provided by the patient.  Back Pain Location:  Lumbar spine Quality:  Aching Radiates to:  R thigh Pain severity:  Severe Associated symptoms: no abdominal pain, no chest pain and no fever   Associated symptoms comment:  She has chronic low back and right hip pain treated by her PCP with oxycodone. She reports nausea with vomiting since this morning and being unable to hold any of her regular medications on her stomach. She is also complaining of neck pain with tingling into her right arm. She has a history of neck surgery that is re-evaluated periodically by her neurosurgeon.    Past Medical History  Diagnosis Date  . Cardiomyopathy     resolved  . Chest pain     chronicc  . Hyperlipidemia   . HTN (hypertension)   . Hypothyroidism   . Adrenal insufficiency   . Anxiety   . Nondiabetic gastroparesis   . CAD (coronary artery disease)     Cath 2008 EF normal. RCA 50-60, Septal 50%. Myoview 3/12: EF 53% normal perfusion  . Chronic back pain   . History of CHF (congestive heart failure)   . QT prolongation   . Addison disease   . Mitral valve prolapse   . Gastroparesis   . Chronic diarrhea   . Astigmatism     Past Surgical History  Procedure Laterality Date  . Cholecystectomy    . Spine surgery    . Vesicovaginal fistula closure w/ tah    . Varicose vein surgery    . Abdominal hysterectomy    . Cardiac catheterization  03/2007    showed 60% lesion in the right coronary artery    Family History  Problem Relation Age of Onset  . Coronary artery disease    . Heart attack Mother   . Cancer Father     Colon    History   Substance Use Topics  . Smoking status: Current Every Day Smoker -- 0.50 packs/day for 10 years    Types: Cigarettes  . Smokeless tobacco: Never Used     Comment: smokes 6-7 cigarettes per day  . Alcohol Use: No    OB History   Grav Para Term Preterm Abortions TAB SAB Ect Mult Living                  Review of Systems  Constitutional: Negative for fever.  HENT: Positive for neck pain. Negative for trouble swallowing.   Respiratory: Negative for shortness of breath.   Cardiovascular: Negative for chest pain.  Gastrointestinal: Positive for nausea and vomiting. Negative for abdominal pain.  Genitourinary: Negative for flank pain.  Musculoskeletal: Positive for back pain and arthralgias.    Allergies  Bee venom; Penicillins; Cephalexin; Doxycycline; Dust mite extract; Erythromycin; Morphine and related; Sulfonamide derivatives; and Tramadol  Home Medications   Current Outpatient Rx  Name  Route  Sig  Dispense  Refill  . aspirin 81 MG chewable tablet   Oral   Chew 81-324 mg by mouth daily. Daily dose is 1 tab, takes 4 tabs if feeling chest pain or pressure         . hydrocortisone (CORTEF) 10  MG tablet   Oral   Take 20 mg by mouth 2 (two) times daily.          Marland Kitchen levothyroxine (SYNTHROID, LEVOTHROID) 75 MCG tablet   Oral   Take 75 mcg by mouth daily before breakfast.         . LORazepam (ATIVAN) 1 MG tablet   Oral   Take 1 mg by mouth every 4 (four) hours. For anxiety Scheduled dose         . metoCLOPramide (REGLAN) 10 MG tablet   Oral   Take 20 mg by mouth 3 (three) times daily as needed. Runny stools         . mirtazapine (REMERON) 15 MG tablet   Oral   Take 15 mg by mouth at bedtime.         . nitroGLYCERIN (NITROSTAT) 0.4 MG SL tablet   Sublingual   Place 1 tablet (0.4 mg total) under the tongue every 5 (five) minutes as needed for chest pain.   25 tablet   3   . ondansetron (ZOFRAN) 4 MG tablet   Oral   Take 4-8 mg by mouth every 4 (four)  hours as needed. For nausea         . oxycodone (ROXICODONE) 30 MG immediate release tablet   Oral   Take 30 mg by mouth every 4 (four) hours as needed. For pain           BP 104/51  Pulse 100  Temp(Src) 99.9 F (37.7 C) (Oral)  Resp 18  SpO2 93%  Physical Exam  Constitutional: She is oriented to person, place, and time. She appears well-developed and well-nourished.  HENT:  Head: Normocephalic.  Neck: Normal range of motion. Neck supple.  Cardiovascular: Normal rate and regular rhythm.   Pulmonary/Chest: Effort normal and breath sounds normal.  Abdominal: Soft. Bowel sounds are normal. There is no tenderness. There is no rebound and no guarding.  Musculoskeletal: Normal range of motion.  Mild lumbar and paralumbar tenderness without swelling or discoloration.  Neurological: She is alert and oriented to person, place, and time. She has normal reflexes. Coordination normal.  Finger to nose without deficit. Cranial nerves 3-12 grossly intact. Patient's speech is slurred but coherent.   Skin: Skin is warm and dry. No rash noted.  Psychiatric: She has a normal mood and affect.  Somnolent.    ED Course  Procedures (including critical care time)  Labs Reviewed - No data to display No results found.   No diagnosis found. 1. Chronic back pain 2. Neck pain    MDM  She is slurring her speech and is somnolent. She is giving conflicting history to nurse that she took her medications prior to arrival and still hurts, and to me that she has been unable to hold her medications down and is pain. She has no neurologic deficits that would support slurred speech as a symptom of stroke or acute neurologic event. Blood pressure is low as well. All of which support the suspicion that she has possibly taken more pain medication than her usual, or at least has been able to tolerate taking it at home. I will offer phenergan and encourage her regular use of her current pain medications.    Patient's blood pressure decreases into 80's systolic. She continues to be somnolent but easily arousable. She repeatedly asks for pain medications. IV fluids given. Blood pressure improves. Stable for discharge home. Family at bedside to take patient home.  Arnoldo Hooker, PA-C 12/31/12 1241  Arnoldo Hooker, PA-C 12/31/12 1518

## 2012-12-31 NOTE — ED Notes (Signed)
Patient c/o chronic pain in neck, back, and hip pain.  Patient is sedated on arrival and states she took, Oxycontin, Ativan, Neurontiin before arriving to the ED.

## 2012-12-31 NOTE — ED Notes (Signed)
Pt requesting pain medication and states "If ya'll aren't going to give me something for my pain release me and I will take my own".

## 2012-12-31 NOTE — ED Notes (Signed)
Upon ambulation pt's gate was unsteady and O2 dipped to 89 with very little movement.  When returned to monitor, pt O2 read 87 and pt was tachycardiac at 127.

## 2012-12-31 NOTE — ED Notes (Addendum)
Pt continues to state she is going to leave since we will  Not give her anything for pain. RN explained the concerns of her low oxygen level could be dangerous if we administer additional medication. Pt states she had things going on at home and she needs to go take care of her children, they do not care about her health. Pt did not give details as to problems at home. Pt encouraged to return if symptoms become worse.

## 2012-12-31 NOTE — ED Notes (Signed)
Pt alert at this time, very tearful, asking for pain medication. States she is "in the worst pain of her life". Explained to pt why she cannot have narcotics at this time

## 2012-12-31 NOTE — ED Provider Notes (Signed)
Pt signed out to me at change of shift by Dr. Patria Mane.  Awaiting labs, CXR and hydration, observing for improvement in condition/vitals.  CXR shows mild pulmonary edema.  BNP ordered.  Pt ambulated and was mildly hypoxic with ambulation.  Pt refused BNP, requesting pain medication.  Explained to her that we would not be giving pain medications as this could have contributed to her decreased BP and hypoxia and would not be safe.  Pt requested to leave the ED.  She agreed to sign AMA. She was encouarged to stay by multiple staff.  She also states she had some child care issues and needed to leave.  She signed out AMA.    Ethelda Chick, MD 12/31/12 1750

## 2013-01-04 ENCOUNTER — Encounter (HOSPITAL_COMMUNITY): Payer: Self-pay | Admitting: *Deleted

## 2013-01-04 ENCOUNTER — Emergency Department (HOSPITAL_COMMUNITY)
Admission: EM | Admit: 2013-01-04 | Discharge: 2013-01-04 | Disposition: A | Discharge status: Home / Self Care | Payer: Medicaid Other | Attending: Emergency Medicine | Admitting: Emergency Medicine

## 2013-01-04 DIAGNOSIS — F411 Generalized anxiety disorder: Secondary | ICD-10-CM | POA: Insufficient documentation

## 2013-01-04 DIAGNOSIS — Z8679 Personal history of other diseases of the circulatory system: Secondary | ICD-10-CM | POA: Insufficient documentation

## 2013-01-04 DIAGNOSIS — F17200 Nicotine dependence, unspecified, uncomplicated: Secondary | ICD-10-CM | POA: Insufficient documentation

## 2013-01-04 DIAGNOSIS — N949 Unspecified condition associated with female genital organs and menstrual cycle: Secondary | ICD-10-CM | POA: Insufficient documentation

## 2013-01-04 DIAGNOSIS — I10 Essential (primary) hypertension: Secondary | ICD-10-CM | POA: Insufficient documentation

## 2013-01-04 DIAGNOSIS — Z8639 Personal history of other endocrine, nutritional and metabolic disease: Secondary | ICD-10-CM | POA: Insufficient documentation

## 2013-01-04 DIAGNOSIS — E785 Hyperlipidemia, unspecified: Secondary | ICD-10-CM | POA: Insufficient documentation

## 2013-01-04 DIAGNOSIS — Z880 Allergy status to penicillin: Secondary | ICD-10-CM | POA: Insufficient documentation

## 2013-01-04 DIAGNOSIS — M545 Low back pain, unspecified: Secondary | ICD-10-CM | POA: Insufficient documentation

## 2013-01-04 DIAGNOSIS — N760 Acute vaginitis: Secondary | ICD-10-CM | POA: Diagnosis not present

## 2013-01-04 DIAGNOSIS — I509 Heart failure, unspecified: Secondary | ICD-10-CM | POA: Insufficient documentation

## 2013-01-04 DIAGNOSIS — Z862 Personal history of diseases of the blood and blood-forming organs and certain disorders involving the immune mechanism: Secondary | ICD-10-CM | POA: Insufficient documentation

## 2013-01-04 DIAGNOSIS — I2510 Atherosclerotic heart disease of native coronary artery without angina pectoris: Secondary | ICD-10-CM | POA: Insufficient documentation

## 2013-01-04 DIAGNOSIS — G8929 Other chronic pain: Secondary | ICD-10-CM | POA: Insufficient documentation

## 2013-01-04 DIAGNOSIS — E039 Hypothyroidism, unspecified: Secondary | ICD-10-CM | POA: Insufficient documentation

## 2013-01-04 DIAGNOSIS — Z8719 Personal history of other diseases of the digestive system: Secondary | ICD-10-CM | POA: Insufficient documentation

## 2013-01-04 DIAGNOSIS — N898 Other specified noninflammatory disorders of vagina: Secondary | ICD-10-CM | POA: Insufficient documentation

## 2013-01-04 DIAGNOSIS — Z9861 Coronary angioplasty status: Secondary | ICD-10-CM | POA: Insufficient documentation

## 2013-01-04 DIAGNOSIS — Z79899 Other long term (current) drug therapy: Secondary | ICD-10-CM | POA: Insufficient documentation

## 2013-01-04 DIAGNOSIS — R112 Nausea with vomiting, unspecified: Secondary | ICD-10-CM | POA: Insufficient documentation

## 2013-01-04 DIAGNOSIS — R509 Fever, unspecified: Secondary | ICD-10-CM | POA: Insufficient documentation

## 2013-01-04 LAB — CBC WITH DIFFERENTIAL/PLATELET
Basophils Relative: 0 % (ref 0–1)
Eosinophils Absolute: 0.2 10*3/uL (ref 0.0–0.7)
HCT: 39.5 % (ref 36.0–46.0)
Hemoglobin: 13.5 g/dL (ref 12.0–15.0)
MCH: 28.7 pg (ref 26.0–34.0)
MCHC: 34.2 g/dL (ref 30.0–36.0)
Monocytes Absolute: 0.9 10*3/uL (ref 0.1–1.0)
Monocytes Relative: 6 % (ref 3–12)

## 2013-01-04 LAB — WET PREP, GENITAL
Trich, Wet Prep: NONE SEEN
Yeast Wet Prep HPF POC: NONE SEEN

## 2013-01-04 LAB — URINALYSIS, ROUTINE W REFLEX MICROSCOPIC
Glucose, UA: NEGATIVE mg/dL
Leukocytes, UA: NEGATIVE
Nitrite: NEGATIVE
Protein, ur: NEGATIVE mg/dL
Urobilinogen, UA: 1 mg/dL (ref 0.0–1.0)

## 2013-01-04 LAB — COMPREHENSIVE METABOLIC PANEL
Albumin: 3.4 g/dL — ABNORMAL LOW (ref 3.5–5.2)
BUN: 6 mg/dL (ref 6–23)
Chloride: 98 mEq/L (ref 96–112)
Creatinine, Ser: 0.82 mg/dL (ref 0.50–1.10)
GFR calc Af Amer: >90 mL/min (ref >90)
Total Bilirubin: 0.2 mg/dL — ABNORMAL LOW (ref 0.3–1.2)
Total Protein: 7.5 g/dL (ref 6.0–8.3)

## 2013-01-04 MED ORDER — METRONIDAZOLE 500 MG PO TABS: 500.0000 mg | ORAL_TABLET | Freq: Two times a day (BID) | ORAL | Status: DC

## 2013-01-04 MED ORDER — SODIUM CHLORIDE 0.9 % IV BOLUS (SEPSIS)
1000.0000 mL | Freq: Once | INTRAVENOUS | Status: AC
  Administered 2013-01-04: 1000 mL via INTRAVENOUS

## 2013-01-04 MED ORDER — PROMETHAZINE HCL 25 MG RE SUPP: 25.0000 mg | Freq: Four times a day (QID) | RECTAL | Status: DC | PRN

## 2013-01-04 MED ORDER — HYDROMORPHONE HCL PF 1 MG/ML IJ SOLN
1.0000 mg | Freq: Once | INTRAMUSCULAR | Status: AC
  Administered 2013-01-04: 1 mg via INTRAVENOUS
  Filled 2013-01-04: qty 1

## 2013-01-04 MED ORDER — PROMETHAZINE HCL 25 MG/ML IJ SOLN
25.0000 mg | Freq: Once | INTRAMUSCULAR | Status: AC
  Administered 2013-01-04: 25 mg via INTRAVENOUS
  Filled 2013-01-04: qty 1

## 2013-01-04 NOTE — ED Provider Notes (Signed)
History     CSN: [id number]  Arrival date & time 01/04/13  4098   First MD Initiated Contact with Patient 01/04/13 1001      Chief Complaint  Patient presents with  . Abdominal Pain    (Consider location/radiation/quality/duration/timing/severity/associated sxs/prior treatment) HPI Pt is a 47yo female c/o lower abdominal pain that is moderate to severe, achy and throbbing radiating to her lower back.  States pain has been there for past several months but has been unable to f/u with medical care for pain.  Pt states she was seen on Sunday (4/27) for workup but had to leave AMA to pick up her son.  During that time, a nurse mentioned she had malodorous vaginal discharge which pt became more aware of since visit.  States she is on chronic pain medication and nausea medicine for Addison's disease but states she has been so nauseous she vomites and does not believe she is getting the proper dose.   Reports feeling feverish but temp never measured at home.   Past Medical History  Diagnosis Date  . Cardiomyopathy     resolved  . Chest pain     chronicc  . Hyperlipidemia   . HTN (hypertension)   . Hypothyroidism   . Adrenal insufficiency   . Anxiety   . Nondiabetic gastroparesis   . CAD (coronary artery disease)     Cath 2008 EF normal. RCA 50-60, Septal 50%. Myoview 3/12: EF 53% normal perfusion  . Chronic back pain   . History of CHF (congestive heart failure)   . QT prolongation   . Addison disease   . Mitral valve prolapse   . Gastroparesis   . Chronic diarrhea   . Astigmatism     Past Surgical History  Procedure Laterality Date  . Cholecystectomy    . Spine surgery    . Vesicovaginal fistula closure w/ tah    . Varicose vein surgery    . Abdominal hysterectomy    . Cardiac catheterization  03/2007    showed 60% lesion in the right coronary artery    Family History  Problem Relation Age of Onset  . Coronary artery disease    . Heart attack Mother   . Cancer Father      Colon    History  Substance Use Topics  . Smoking status: Current Every Day Smoker -- 0.50 packs/day for 10 years    Types: Cigarettes  . Smokeless tobacco: Never Used     Comment: smokes 6-7 cigarettes per day  . Alcohol Use: No    OB History   Grav Para Term Preterm Abortions TAB SAB Ect Mult Living                  Review of Systems  Constitutional: Positive for fever and chills. Negative for diaphoresis and fatigue.  Respiratory: Negative for cough and shortness of breath.   Cardiovascular: Negative for chest pain.  Gastrointestinal: Positive for nausea, vomiting and abdominal pain. Negative for constipation.  Genitourinary: Positive for vaginal discharge and pelvic pain. Negative for dysuria, urgency, hematuria and flank pain.  Musculoskeletal: Positive for back pain ( lower).  All other systems reviewed and are negative.    Allergies  Bee venom; Penicillins; Cephalexin; Doxycycline; Dust mite extract; Erythromycin; Morphine and related; Sulfonamide derivatives; and Tramadol  Home Medications   Current Outpatient Rx  Name  Route  Sig  Dispense  Refill  . amLODipine (NORVASC) 10 MG tablet   Oral   Take  10 mg by mouth daily.         Marland Kitchen aspirin 81 MG chewable tablet   Oral   Chew 81 mg by mouth daily. Daily dose is 1 tab, takes 4 tabs if feeling chest pain or pressure         . hydrocortisone (CORTEF) 10 MG tablet   Oral   Take 10-20 mg by mouth 2 (two) times daily. Pt takes 20 mg in the morning and 10 mg at night         . levothyroxine (SYNTHROID, LEVOTHROID) 75 MCG tablet   Oral   Take 75 mcg by mouth daily before breakfast.         . LORazepam (ATIVAN) 1 MG tablet   Oral   Take 1 mg by mouth every 4 (four) hours. For anxiety Scheduled dose         . losartan (COZAAR) 50 MG tablet   Oral   Take 50 mg by mouth daily.         . metoCLOPramide (REGLAN) 10 MG tablet   Oral   Take 20 mg by mouth 3 (three) times daily as needed. Runny  stools         . mirtazapine (REMERON) 15 MG tablet   Oral   Take 15 mg by mouth at bedtime.         . nitroGLYCERIN (NITROSTAT) 0.4 MG SL tablet   Sublingual   Place 1 tablet (0.4 mg total) under the tongue every 5 (five) minutes as needed for chest pain.   25 tablet   3   . ondansetron (ZOFRAN) 4 MG tablet   Oral   Take 4-8 mg by mouth every 4 (four) hours as needed. For nausea         . oxycodone (ROXICODONE) 30 MG immediate release tablet   Oral   Take 30 mg by mouth every 4 (four) hours as needed. For pain         . promethazine (PHENERGAN) 25 MG suppository   Rectal   Place 1 suppository (25 mg total) rectally every 6 (six) hours as needed for nausea.   12 each   0   . metroNIDAZOLE (FLAGYL) 500 MG tablet   Oral   Take 1 tablet (500 mg total) by mouth 2 (two) times daily.   14 tablet   0   . promethazine (PHENERGAN) 25 MG suppository   Rectal   Place 1 suppository (25 mg total) rectally every 6 (six) hours as needed for nausea.   12 each   0     BP 143/80  Pulse 98  Temp(Src) 98.4 F (36.9 C) (Oral)  Resp 18  SpO2 98%  Physical Exam  Nursing note and vitals reviewed. Constitutional: She appears well-developed and well-nourished. No distress.  HENT:  Head: Normocephalic and atraumatic.  Mouth/Throat: Oropharynx is clear and moist. No oropharyngeal exudate.  Eyes: Conjunctivae are normal. No scleral icterus.  Neck: Normal range of motion. Neck supple. No JVD present. No tracheal deviation present. No thyromegaly present.  Cardiovascular: Normal rate, regular rhythm and normal heart sounds.   Pulmonary/Chest: Effort normal and breath sounds normal. No stridor. No respiratory distress. She has no wheezes. She has no rales. She exhibits no tenderness.  Abdominal: Soft. Bowel sounds are normal. She exhibits no distension and no mass. There is tenderness ( diffuse, greatest in lower abd). There is no rebound and no guarding.  Genitourinary: Pelvic exam  was performed with patient supine. No  labial fusion. There is no rash, tenderness, lesion or injury on the right labia. There is no rash, tenderness, lesion or injury on the left labia. Cervix exhibits motion tenderness and discharge ( minimal, white malodorous discharge). Cervix exhibits no friability. Right adnexum displays tenderness. Right adnexum displays no mass and no fullness. Left adnexum displays tenderness. Left adnexum displays no mass and no fullness. There is tenderness ( CMT, adnexal tenderness w/o masses ) around the vagina. No erythema or bleeding around the vagina. No foreign body around the vagina. No signs of injury around the vagina. Vaginal discharge ( minimal, white, malodorous ) found.  Musculoskeletal: Normal range of motion.  Lymphadenopathy:    She has no cervical adenopathy.  Neurological: She is alert.  Skin: Skin is warm and dry. She is not diaphoretic.    ED Course  Procedures (including critical care time)  Labs Reviewed  WET PREP, GENITAL - Abnormal; Notable for the following:    Clue Cells Wet Prep HPF POC FEW (*)    WBC, Wet Prep HPF POC FEW (*)    All other components within normal limits  CBC WITH DIFFERENTIAL - Abnormal; Notable for the following:    WBC 15.5 (*)    Platelets 420 (*)    Neutrophils Relative 81 (*)    Neutro Abs 12.5 (*)    All other components within normal limits  COMPREHENSIVE METABOLIC PANEL - Abnormal; Notable for the following:    Potassium 3.1 (*)    Glucose, Bld 100 (*)    Albumin 3.4 (*)    Total Bilirubin 0.2 (*)    GFR calc non Af Amer 83 (*)    All other components within normal limits  URINALYSIS, ROUTINE W REFLEX MICROSCOPIC - Abnormal; Notable for the following:    APPearance CLOUDY (*)    All other components within normal limits  GC/CHLAMYDIA PROBE AMP   No results found.   1. BV (bacterial vaginosis)       MDM  Pt b/c lower abdominal pain for several months.  Informed she had vaginal discharge on Sun.   Became more aware of malodorous discharge and wanted to be reevaluated today.  Pt was started on fluids, dilaudid, and phenergan.  Abd: soft, non-distended. Diffuse tenderness, greater in lower abd. No rebound, guarding or masses.  Pelvic exam: minimal white, malodorous discharge. CMT. Adnexal tenderness w/o masses  CBC: wbc-15.5 UA: neg for UTI Wet prep: few clue cells      Dr. Patria Mane also examined pt who took over pt's discharge.  Will tx for BV and nasuea, Rx: flagyl and phenergan.  Vitals: unremarkable. Discharged in stable condition.    Discussed pt with attending during ED encounter.        Junius Finner, PA-C 01/04/13 2042

## 2013-01-04 NOTE — ED Notes (Signed)
Pt reports being seen here for abdominal pain Sunday but had to leave.  Pt also reports n/v/d with the pain.  Pt also reports vaginal discharge.

## 2013-01-05 LAB — GC/CHLAMYDIA PROBE AMP
CT Probe RNA: NEGATIVE
GC Probe RNA: NEGATIVE

## 2013-01-05 NOTE — ED Provider Notes (Signed)
Medical screening examination/treatment/procedure(s) were conducted as a shared visit with non-physician practitioner(s) and myself.  I personally evaluated the patient during the encounter  Will tx for BV given symptoms.    Lyanne Co, MD 01/05/13 [phone]

## 2013-01-30 DIAGNOSIS — E039 Hypothyroidism, unspecified: Secondary | ICD-10-CM | POA: Diagnosis not present

## 2013-01-30 DIAGNOSIS — G8929 Other chronic pain: Secondary | ICD-10-CM | POA: Diagnosis not present

## 2013-01-30 DIAGNOSIS — Z6831 Body mass index (BMI) 31.0-31.9, adult: Secondary | ICD-10-CM | POA: Diagnosis not present

## 2013-01-30 DIAGNOSIS — I10 Essential (primary) hypertension: Secondary | ICD-10-CM | POA: Diagnosis not present

## 2013-02-03 ENCOUNTER — Inpatient Hospital Stay (HOSPITAL_COMMUNITY): Payer: Medicaid Other

## 2013-02-03 ENCOUNTER — Encounter (HOSPITAL_COMMUNITY): Payer: Self-pay | Admitting: *Deleted

## 2013-02-03 ENCOUNTER — Inpatient Hospital Stay (HOSPITAL_COMMUNITY)
Admission: AD | Admit: 2013-02-03 | Discharge: 2013-02-03 | Disposition: A | Discharge status: Home / Self Care | Payer: Medicaid Other | Source: Ambulatory Visit | Attending: Emergency Medicine | Admitting: Emergency Medicine

## 2013-02-03 DIAGNOSIS — M542 Cervicalgia: Secondary | ICD-10-CM | POA: Diagnosis not present

## 2013-02-03 DIAGNOSIS — R4182 Altered mental status, unspecified: Secondary | ICD-10-CM | POA: Diagnosis not present

## 2013-02-03 DIAGNOSIS — T7411XA Adult physical abuse, confirmed, initial encounter: Secondary | ICD-10-CM | POA: Diagnosis not present

## 2013-02-03 DIAGNOSIS — H1030 Unspecified acute conjunctivitis, unspecified eye: Secondary | ICD-10-CM | POA: Diagnosis not present

## 2013-02-03 DIAGNOSIS — I509 Heart failure, unspecified: Secondary | ICD-10-CM | POA: Insufficient documentation

## 2013-02-03 DIAGNOSIS — S0993XA Unspecified injury of face, initial encounter: Secondary | ICD-10-CM | POA: Diagnosis not present

## 2013-02-03 DIAGNOSIS — M79609 Pain in unspecified limb: Secondary | ICD-10-CM | POA: Insufficient documentation

## 2013-02-03 DIAGNOSIS — R51 Headache: Secondary | ICD-10-CM | POA: Insufficient documentation

## 2013-02-03 DIAGNOSIS — S0990XA Unspecified injury of head, initial encounter: Secondary | ICD-10-CM | POA: Diagnosis not present

## 2013-02-03 DIAGNOSIS — Y92009 Unspecified place in unspecified non-institutional (private) residence as the place of occurrence of the external cause: Secondary | ICD-10-CM | POA: Insufficient documentation

## 2013-02-03 DIAGNOSIS — S40029A Contusion of unspecified upper arm, initial encounter: Secondary | ICD-10-CM | POA: Insufficient documentation

## 2013-02-03 DIAGNOSIS — E2749 Other adrenocortical insufficiency: Secondary | ICD-10-CM | POA: Insufficient documentation

## 2013-02-03 DIAGNOSIS — G8929 Other chronic pain: Secondary | ICD-10-CM | POA: Insufficient documentation

## 2013-02-03 DIAGNOSIS — H109 Unspecified conjunctivitis: Secondary | ICD-10-CM

## 2013-02-03 DIAGNOSIS — I10 Essential (primary) hypertension: Secondary | ICD-10-CM | POA: Insufficient documentation

## 2013-02-03 LAB — CBC WITH DIFFERENTIAL/PLATELET
Basophils Absolute: 0.1 10*3/uL (ref 0.0–0.1)
HCT: 36.8 % (ref 36.0–46.0)
Hemoglobin: 12.5 g/dL (ref 12.0–15.0)
Lymphocytes Relative: 38 % (ref 12–46)
Lymphs Abs: 3.6 10*3/uL (ref 0.7–4.0)
Monocytes Absolute: 0.8 10*3/uL (ref 0.1–1.0)
Monocytes Relative: 8 % (ref 3–12)
Neutro Abs: 4.6 10*3/uL (ref 1.7–7.7)
WBC: 9.5 10*3/uL (ref 4.0–10.5)

## 2013-02-03 LAB — RAPID URINE DRUG SCREEN, HOSP PERFORMED
Amphetamines: NOT DETECTED
Benzodiazepines: POSITIVE — AB

## 2013-02-03 LAB — BASIC METABOLIC PANEL
BUN: 13 mg/dL (ref 6–23)
CO2: 27 mEq/L (ref 19–32)
Chloride: 104 mEq/L (ref 96–112)
Creatinine, Ser: 0.75 mg/dL (ref 0.50–1.10)
Glucose, Bld: 97 mg/dL (ref 70–99)

## 2013-02-03 LAB — ETHANOL: Alcohol, Ethyl (B): <11 mg/dL (ref 0–11)

## 2013-02-03 MED ORDER — LEVOFLOXACIN 0.5 % OP SOLN: 1.0000 [drp] | Freq: Four times a day (QID) | OPHTHALMIC | Status: DC

## 2013-02-03 MED ORDER — OXYCODONE HCL 5 MG PO TABS
15.0000 mg | ORAL_TABLET | Freq: Once | ORAL | Status: AC
  Administered 2013-02-03: 15 mg via ORAL
  Filled 2013-02-03: qty 3

## 2013-02-03 MED ORDER — SODIUM CHLORIDE 0.9 % IV BOLUS (SEPSIS)
1000.0000 mL | Freq: Once | INTRAVENOUS | Status: AC
  Administered 2013-02-03: 1000 mL via INTRAVENOUS
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
## 2013-02-03 NOTE — MAU Note (Signed)
[facility] in to see pt and obtain information.
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
## 2013-02-03 NOTE — Progress Notes (Signed)
Transfer discussed earlier by Raynelle Fanning Either PA and pt signed transfer consent
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
## 2013-02-03 NOTE — ED Provider Notes (Signed)
Medical screening examination/treatment/procedure(s) were performed by non-physician practitioner and as supervising physician I was immediately available for consultation/collaboration.   Julie Manly, MD 02/03/13 0834 

## 2013-02-04 NOTE — MAU Provider Note (Signed)
Attestation of Attending Supervision of Advanced Practitioner: Evaluation and management procedures were performed by the PA/NP/CNM/OB Fellow under my supervision/collaboration. Chart reviewed and agree with management and plan.  Tilda Burrow 02/04/2013 6:47 PM

## 2013-02-22 NOTE — ED Provider Notes (Signed)
Medical screening examination/treatment/procedure(s) were performed by non-physician practitioner and as supervising physician I was immediately available for consultation/collaboration.   Julie Manly, MD 02/22/13 0411 

## 2013-03-02 ENCOUNTER — Ambulatory Visit (HOSPITAL_COMMUNITY)
Admission: RE | Admit: 2013-03-02 | Discharge: 2013-03-02 | Disposition: A | Discharge status: Home / Self Care | Payer: Medicare Other | Source: Ambulatory Visit | Attending: Internal Medicine | Admitting: Internal Medicine

## 2013-03-02 ENCOUNTER — Other Ambulatory Visit (HOSPITAL_COMMUNITY): Payer: Self-pay | Admitting: Internal Medicine

## 2013-03-02 DIAGNOSIS — IMO0002 Reserved for concepts with insufficient information to code with codable children: Secondary | ICD-10-CM

## 2013-03-02 DIAGNOSIS — M25559 Pain in unspecified hip: Secondary | ICD-10-CM | POA: Diagnosis not present

## 2013-03-23 ENCOUNTER — Other Ambulatory Visit (HOSPITAL_COMMUNITY): Payer: Self-pay | Admitting: Internal Medicine

## 2013-03-23 DIAGNOSIS — Z139 Encounter for screening, unspecified: Secondary | ICD-10-CM

## 2013-03-23 DIAGNOSIS — G8929 Other chronic pain: Secondary | ICD-10-CM | POA: Diagnosis not present

## 2013-03-23 DIAGNOSIS — M159 Polyosteoarthritis, unspecified: Secondary | ICD-10-CM | POA: Diagnosis not present

## 2013-03-23 DIAGNOSIS — I10 Essential (primary) hypertension: Secondary | ICD-10-CM | POA: Diagnosis not present

## 2013-03-23 DIAGNOSIS — E039 Hypothyroidism, unspecified: Secondary | ICD-10-CM | POA: Diagnosis not present

## 2013-03-23 DIAGNOSIS — Z6830 Body mass index (BMI) 30.0-30.9, adult: Secondary | ICD-10-CM | POA: Diagnosis not present

## 2013-03-26 ENCOUNTER — Ambulatory Visit (HOSPITAL_COMMUNITY)
Admission: RE | Admit: 2013-03-26 | Discharge: 2013-03-26 | Disposition: A | Discharge status: Home / Self Care | Payer: Medicare Other | Source: Ambulatory Visit | Attending: Internal Medicine | Admitting: Internal Medicine

## 2013-03-26 DIAGNOSIS — Z1231 Encounter for screening mammogram for malignant neoplasm of breast: Secondary | ICD-10-CM | POA: Insufficient documentation

## 2013-03-26 DIAGNOSIS — Z139 Encounter for screening, unspecified: Secondary | ICD-10-CM

## 2013-04-16 ENCOUNTER — Ambulatory Visit: Payer: Self-pay | Admitting: Gynecology

## 2013-04-25 ENCOUNTER — Ambulatory Visit: Payer: Self-pay | Admitting: Gynecology

## 2013-05-01 DIAGNOSIS — M159 Polyosteoarthritis, unspecified: Secondary | ICD-10-CM | POA: Diagnosis not present

## 2013-05-01 DIAGNOSIS — M5412 Radiculopathy, cervical region: Secondary | ICD-10-CM | POA: Diagnosis not present

## 2013-05-01 DIAGNOSIS — G8929 Other chronic pain: Secondary | ICD-10-CM | POA: Diagnosis not present

## 2013-05-01 DIAGNOSIS — Z23 Encounter for immunization: Secondary | ICD-10-CM | POA: Diagnosis not present

## 2013-05-01 DIAGNOSIS — Z6830 Body mass index (BMI) 30.0-30.9, adult: Secondary | ICD-10-CM | POA: Diagnosis not present

## 2013-05-01 DIAGNOSIS — I10 Essential (primary) hypertension: Secondary | ICD-10-CM | POA: Diagnosis not present

## 2013-05-02 ENCOUNTER — Other Ambulatory Visit (HOSPITAL_COMMUNITY): Payer: Self-pay | Admitting: Internal Medicine

## 2013-05-02 DIAGNOSIS — R202 Paresthesia of skin: Secondary | ICD-10-CM

## 2013-05-02 DIAGNOSIS — R200 Anesthesia of skin: Secondary | ICD-10-CM

## 2013-05-02 DIAGNOSIS — F4329 Adjustment disorder with other symptoms: Secondary | ICD-10-CM

## 2013-05-03 ENCOUNTER — Encounter (HOSPITAL_COMMUNITY): Payer: Self-pay

## 2013-05-03 ENCOUNTER — Ambulatory Visit (HOSPITAL_COMMUNITY)
Admission: RE | Admit: 2013-05-03 | Discharge: 2013-05-03 | Disposition: A | Discharge status: Home / Self Care | Payer: Medicare Other | Source: Ambulatory Visit | Attending: Internal Medicine | Admitting: Internal Medicine

## 2013-05-03 DIAGNOSIS — M47812 Spondylosis without myelopathy or radiculopathy, cervical region: Secondary | ICD-10-CM | POA: Diagnosis not present

## 2013-05-03 DIAGNOSIS — R200 Anesthesia of skin: Secondary | ICD-10-CM

## 2013-05-03 DIAGNOSIS — M542 Cervicalgia: Secondary | ICD-10-CM | POA: Diagnosis not present

## 2013-05-03 DIAGNOSIS — R202 Paresthesia of skin: Secondary | ICD-10-CM

## 2013-05-03 DIAGNOSIS — R209 Unspecified disturbances of skin sensation: Secondary | ICD-10-CM | POA: Diagnosis not present

## 2013-05-03 DIAGNOSIS — F4329 Adjustment disorder with other symptoms: Secondary | ICD-10-CM

## 2013-05-03 DIAGNOSIS — M4802 Spinal stenosis, cervical region: Secondary | ICD-10-CM | POA: Diagnosis not present

## 2013-05-31 DIAGNOSIS — K5289 Other specified noninfective gastroenteritis and colitis: Secondary | ICD-10-CM | POA: Diagnosis not present

## 2013-05-31 DIAGNOSIS — G8929 Other chronic pain: Secondary | ICD-10-CM | POA: Diagnosis not present

## 2013-05-31 DIAGNOSIS — Z6830 Body mass index (BMI) 30.0-30.9, adult: Secondary | ICD-10-CM | POA: Diagnosis not present

## 2013-06-01 IMAGING — CT CT HEAD W/O CM
1 series · 16 of 30 positions shown, 20 images · non-contrast
Comparison: 04/16/2012

CLINICAL DATA: Headache.  Weakness.

CT HEAD WITHOUT CONTRAST
TECHNIQUE: Contiguous axial images were obtained from the base of
the skull through the vertex without contrast.

[Series 2: headseq 4.8 h37s · axial · 0.43mm/px · z∈[+95,+234]mm · 16 of 30 slices shown, 20 images]
[im 2/30  brain]
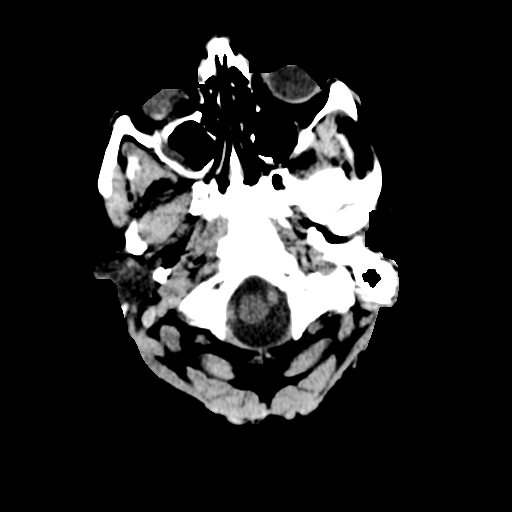
[im 2/30  bone]
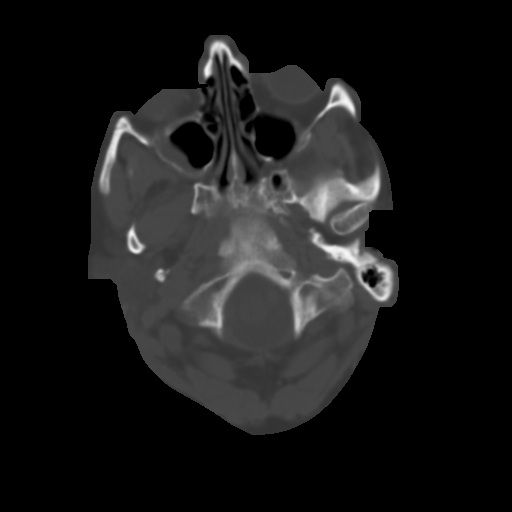
[im 4/30  brain]
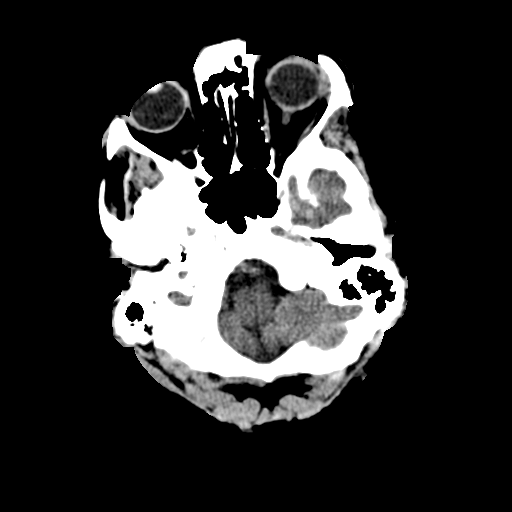
[im 6/30  brain]
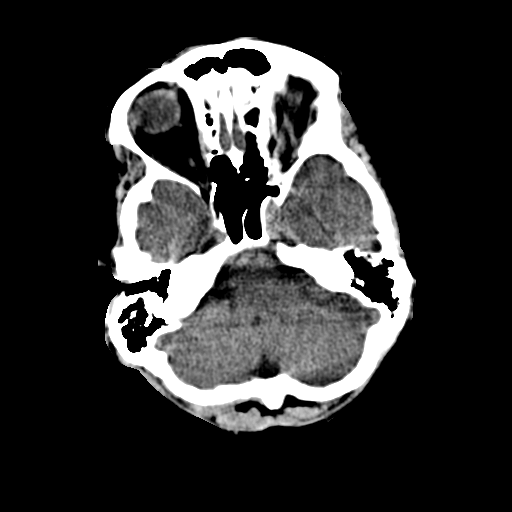
[im 8/30  brain]
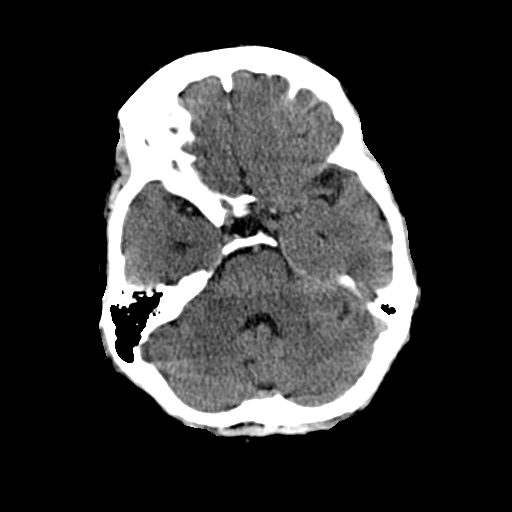
[im 9/30  brain]
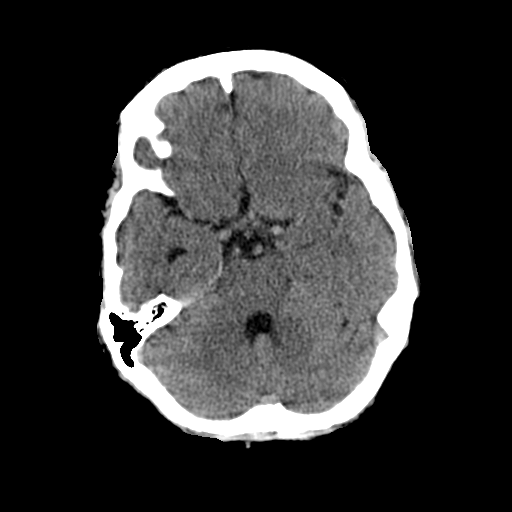
[im 9/30  bone]
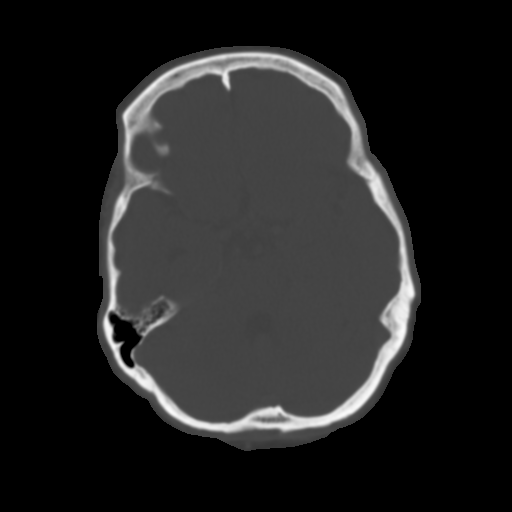
[im 11/30  brain]
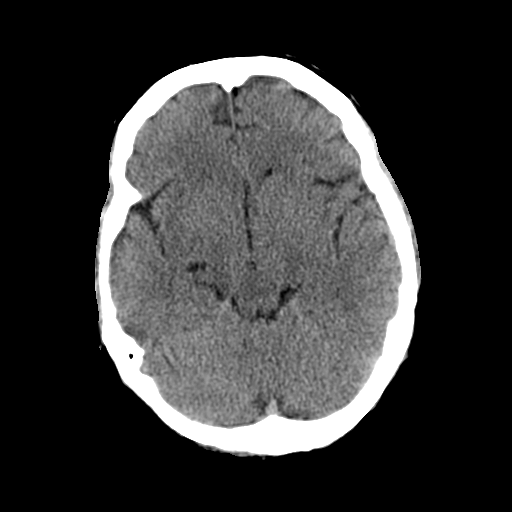
[im 13/30  brain]
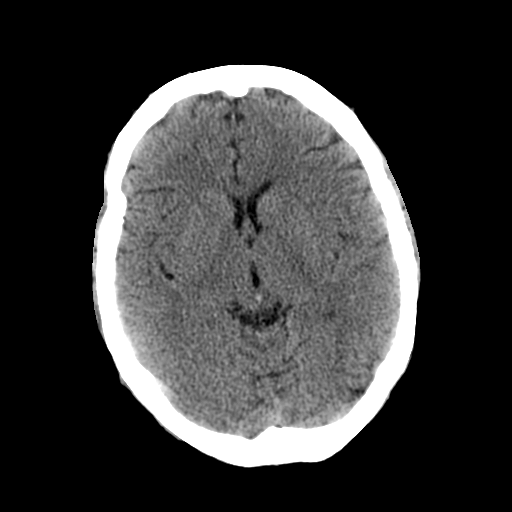
[im 15/30  brain]
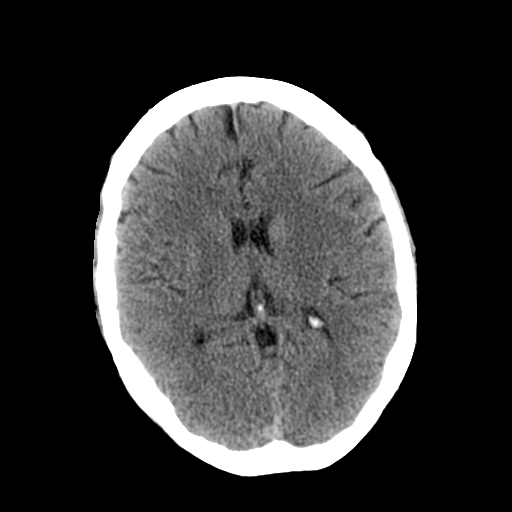
[im 16/30  brain]
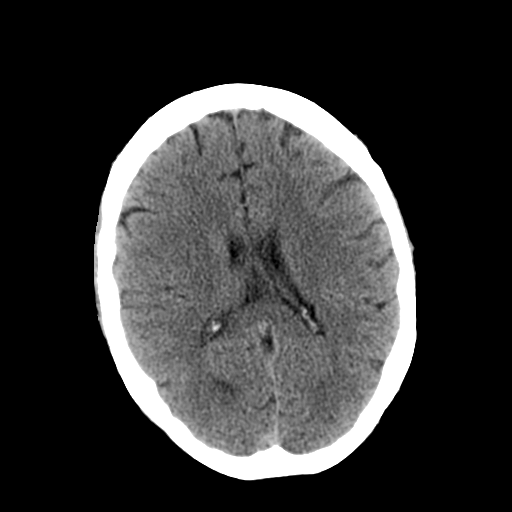
[im 16/30  bone]
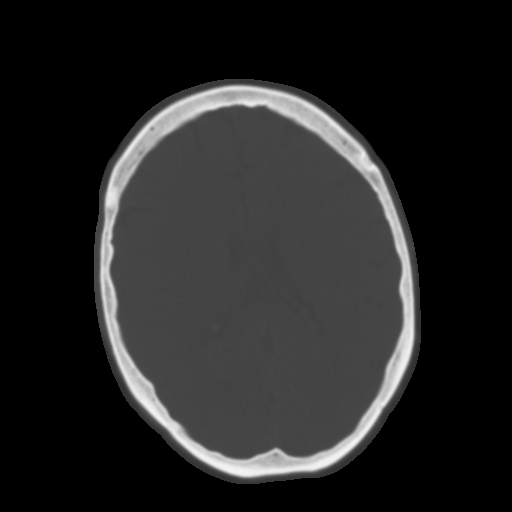
[im 18/30  brain]
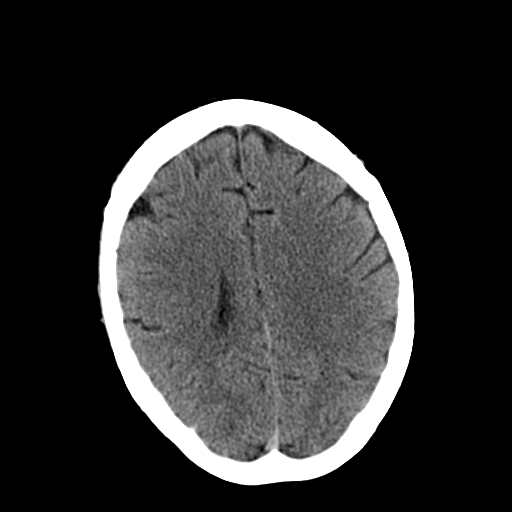
[im 20/30  brain]
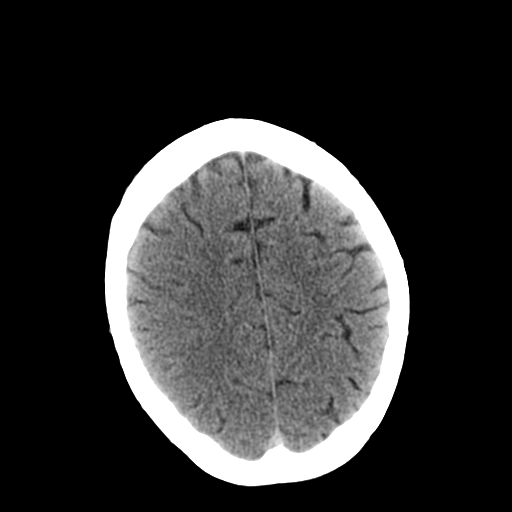
[im 22/30  brain]
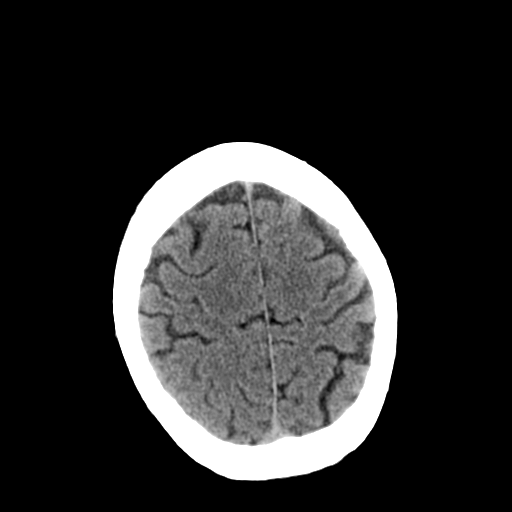
[im 23/30  brain]
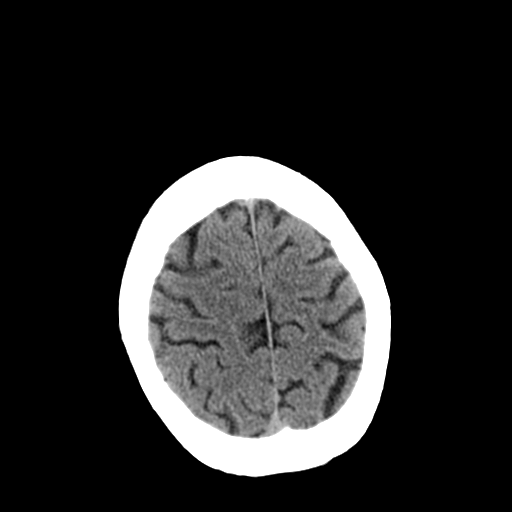
[im 23/30  bone]
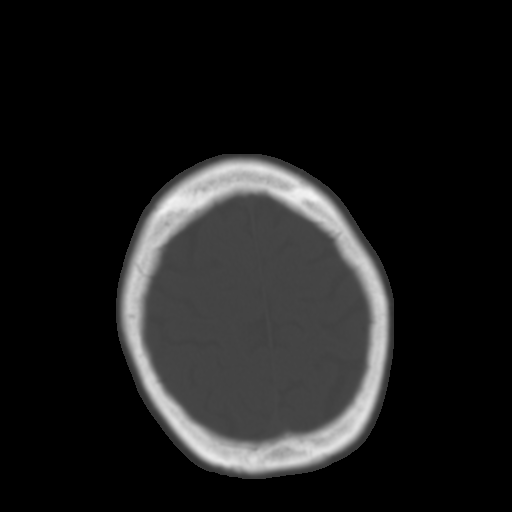
[im 25/30  brain]
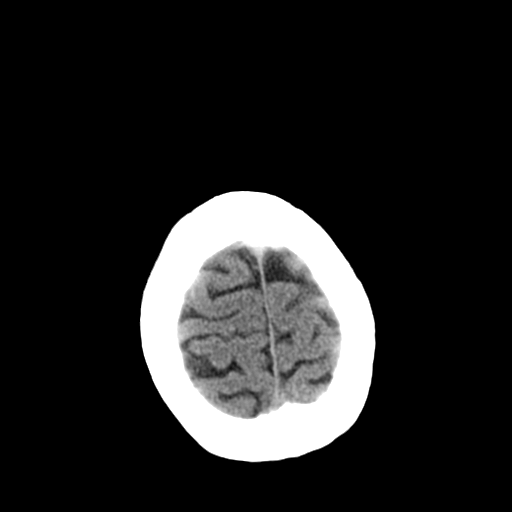
[im 27/30  brain]
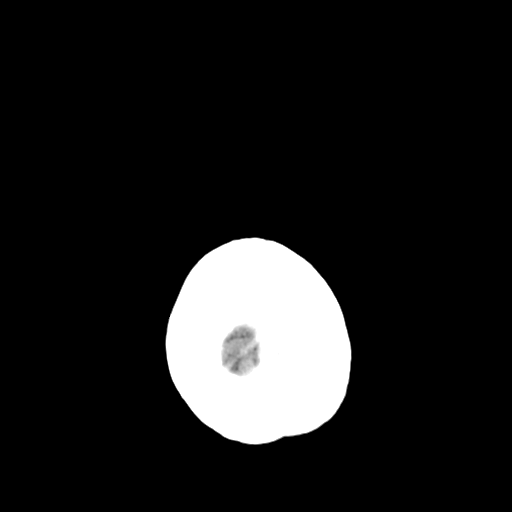
[im 29/30  brain]
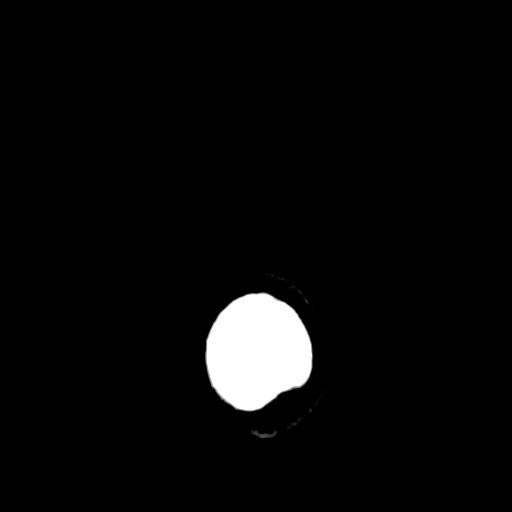

[16 of 30 positions shown; findings below may reference images not displayed]

FINDINGS: The brain has a normal appearance without evidence of
malformation, atrophy, old or acute infarction, mass lesion,
hemorrhage, hydrocephalus or extra-axial collection.  The calvarium
is unremarkable.  There are some inflammatory changes of the right
maxillary sinus, not primarily completely evaluated.
IMPRESSION: Normal appearance of the brain.

Inflammatory change of the right maxillary sinus, not completely
evaluated.

## 2013-06-01 IMAGING — MR MR HEAD W/O CM
6 of 8 series · 28 of 48 positions shown · non-contrast
Comparison: Head CT 09/20/2012.  Brain MRI 06/29/2010.

CLINICAL DATA: 46-year-old female with double vision, dizziness,
weakness.

MRI HEAD WITHOUT CONTRAST
TECHNIQUE: Multiplanar, multiecho pulse sequences of the brain and
surrounding structures were obtained according to standard protocol
without intravenous contrast.

[Series 3: DWI · axial · 5.0mm · 0.72mm/px · z∈[-25,+124]mm · 4 of 24 slices shown (1 of 2)]
[im 1/24]
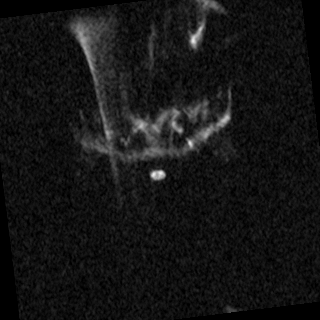
[im 8/24]
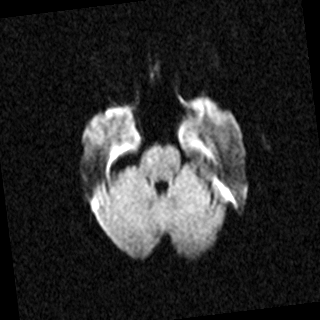
[im 16/24]
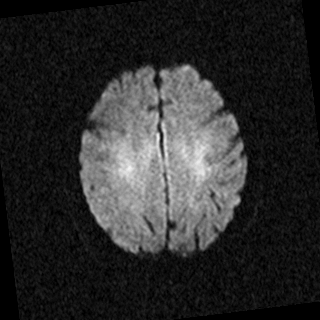
[im 24/24]
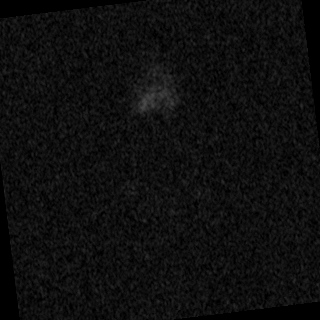

[Series 4: DWI · axial · 5.0mm · 0.72mm/px · z∈[-25,+124]mm · 5 of 24 slices shown (2 of 2)]
[im 1/24]
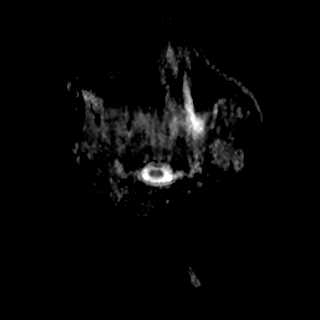
[im 6/24]
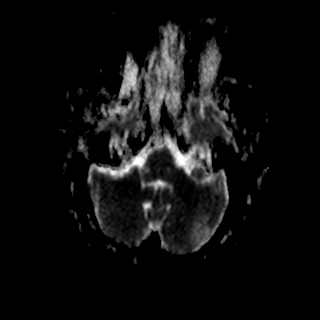
[im 12/24]
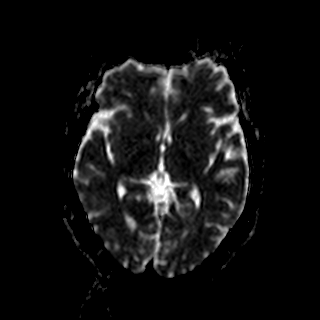
[im 18/24]
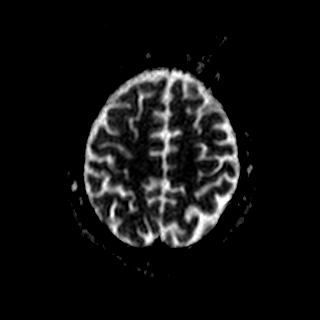
[im 24/24]
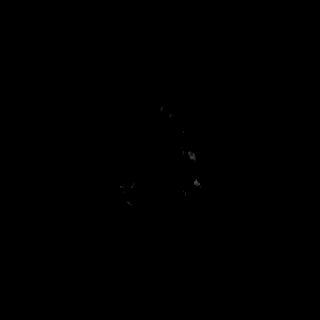

[Series 5: FLAIR · axial · 5.0mm · 0.57mm/px · z∈[-19,+119]mm · 5 of 24 slices shown]
[im 1/24]
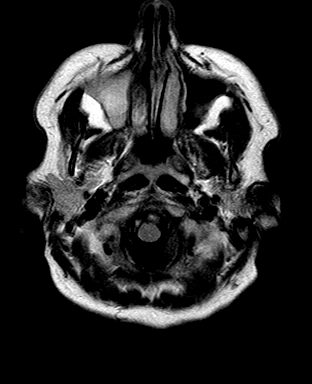
[im 6/24]
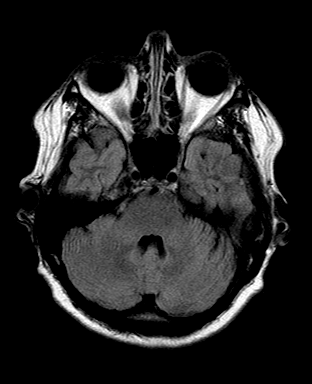
[im 12/24]
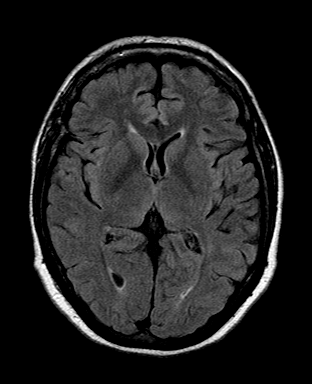
[im 18/24]
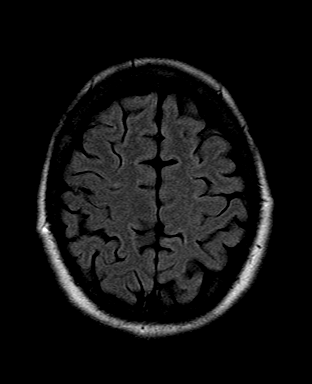
[im 24/24]
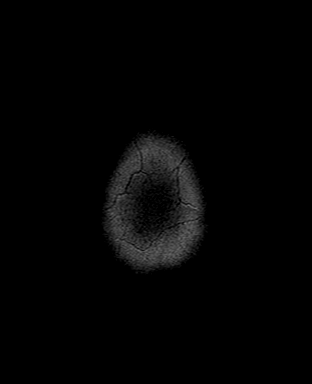

[Series 6: T2 · axial · 5.0mm · 0.62mm/px · z∈[-19,+119]mm · 5 of 24 slices shown (1 of 3)]
[im 1/24]
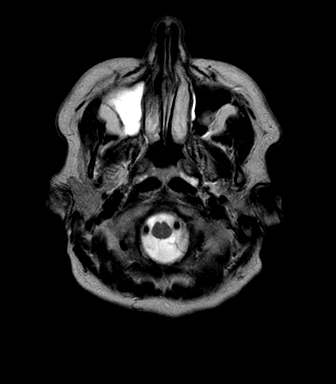
[im 6/24]
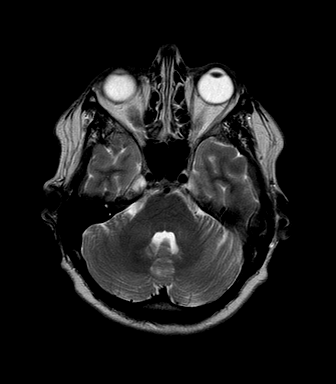
[im 12/24]
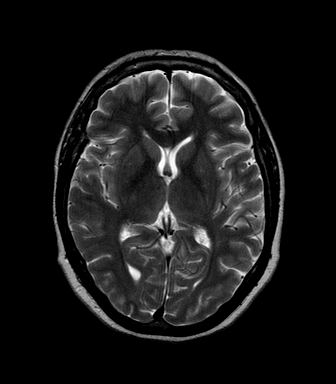
[im 18/24]
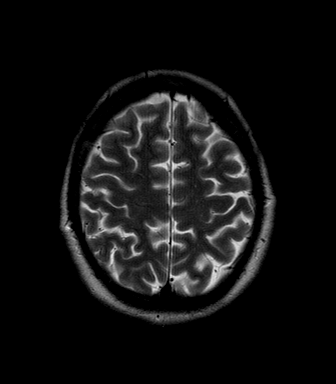
[im 24/24]
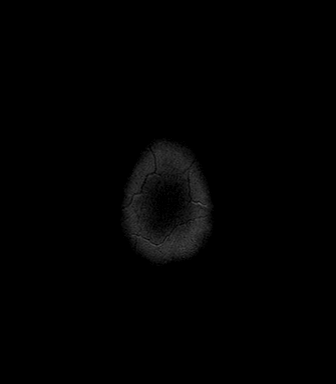

[Series 8: T2 · axial · 5.0mm · 0.47mm/px · z∈[-18,+120]mm · 5 of 24 slices shown (2 of 3)]
[im 1/24]
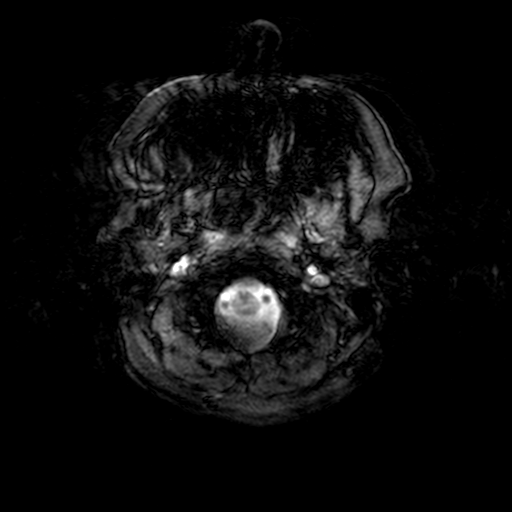
[im 6/24]
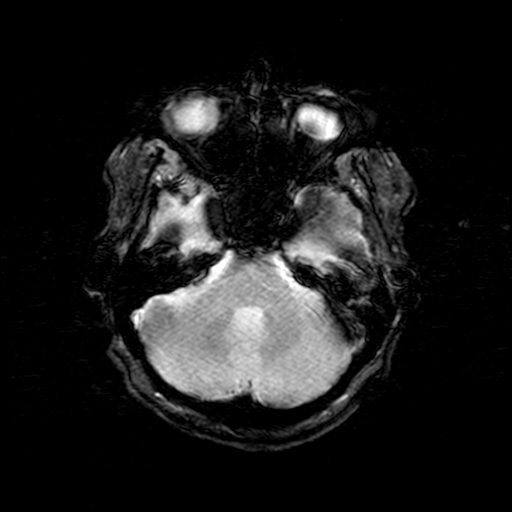
[im 12/24]
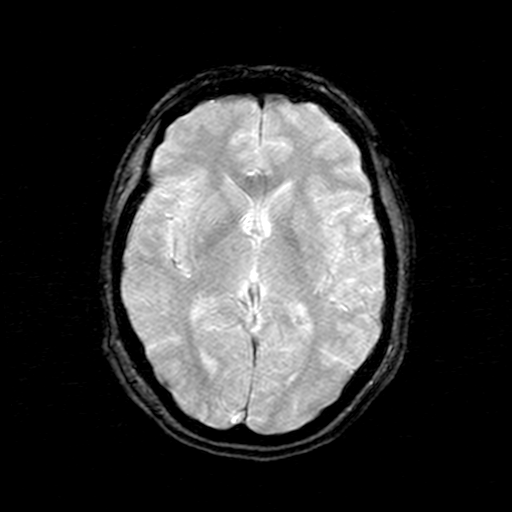
[im 18/24]
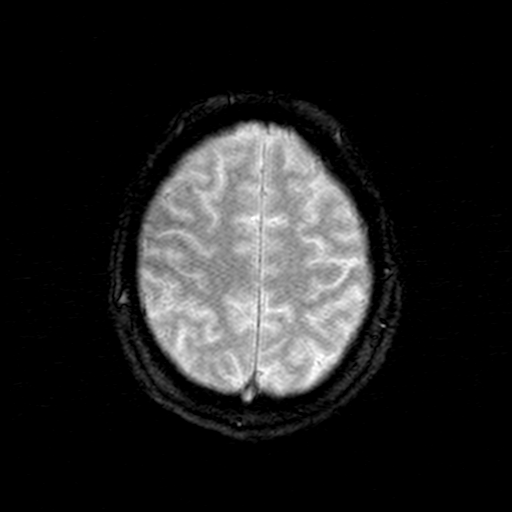
[im 24/24]
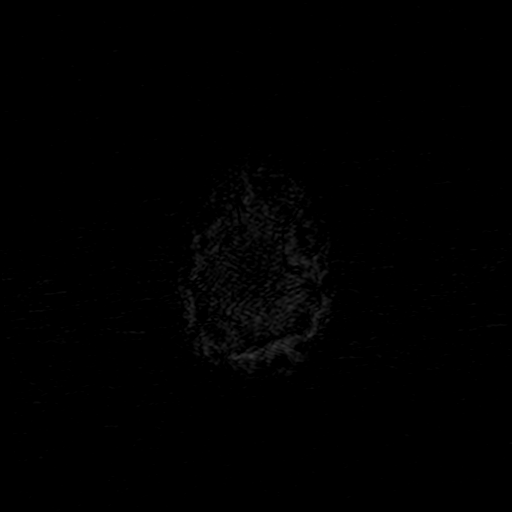

[Series 9: T2 · coronal · 5.0mm · 0.36mm/px · 4 of 30 slices shown (3 of 3)]
[im 1/30]
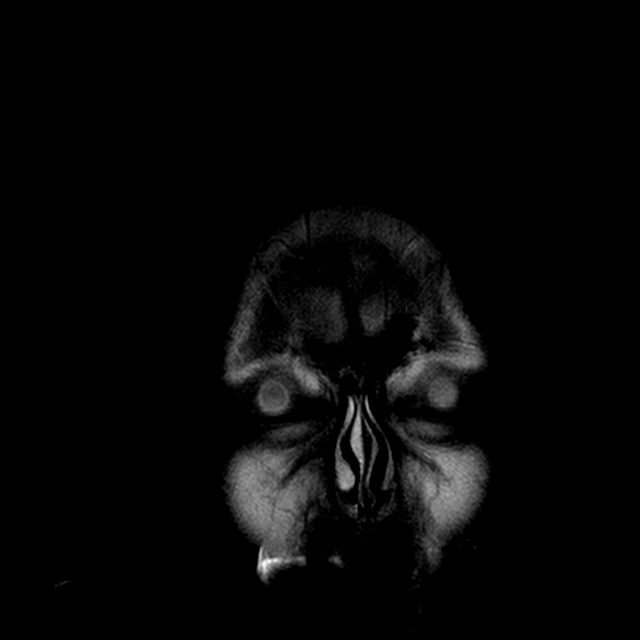
[im 6/30]
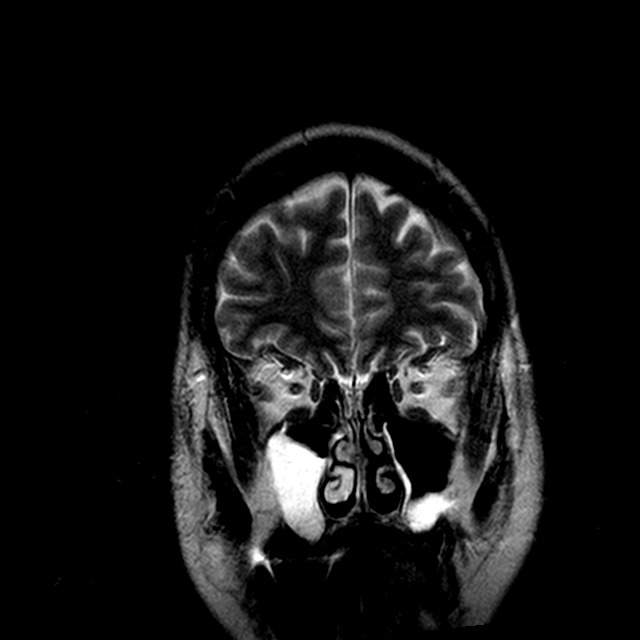
[im 12/30]
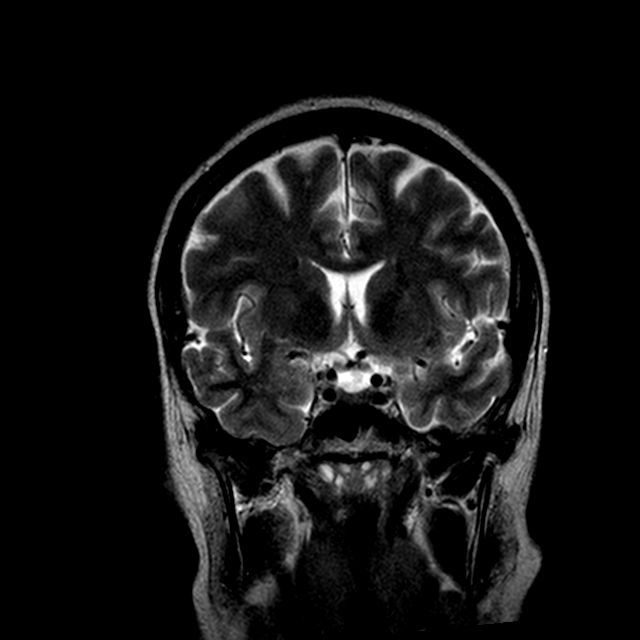
[im 18/30]
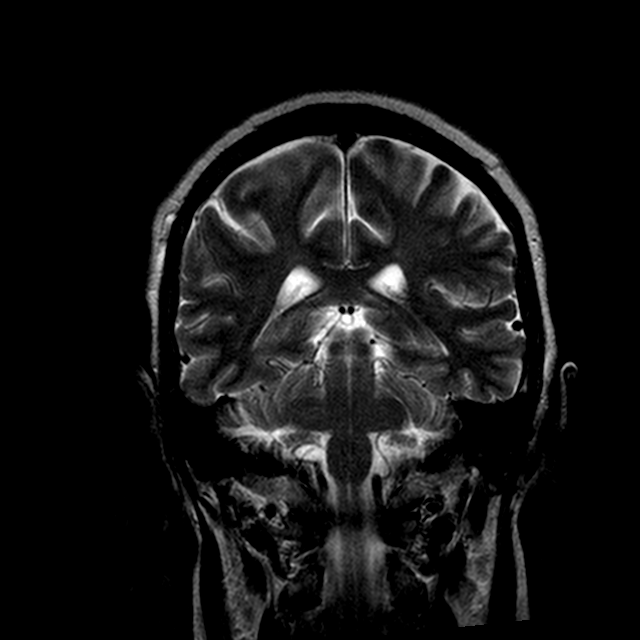

[28 of 48 positions shown; findings below may reference images not displayed]

FINDINGS: Stable normal cerebral volume. No restricted diffusion to
suggest acute infarction.  No midline shift, mass effect, evidence
of mass lesion, ventriculomegaly, extra-axial collection or acute
intracranial hemorrhage.  Cervicomedullary junction and pituitary
are within normal limits.  Major intracranial vascular flow voids
are stable.

Visualized orbit soft tissues are within normal limits.

Stable and normal gray and white matter signal throughout the
brain.  Negative visualized cervical spine. Visualized bone marrow
signal is within normal limits.

New sub total opacification of the right maxillary sinus related to
mucosal thickening and/or fluid.  Mild left maxillary sinus
inflammatory changes.  Other paranasal sinuses are stable.
Mastoids are clear.  Negative scalp soft tissues.
IMPRESSION: 1.  Stable and normal noncontrast MRI appearance the brain.
2.  New right greater than left maxillary sinus inflammatory
changes.

## 2013-06-01 IMAGING — CR DG LUMBAR SPINE COMPLETE 4+V
5 series · 5 of 5 positions shown · non-contrast
Comparison: Prior radiographs the lumbosacral spine 06/17/2011

CLINICAL DATA: Low back pain, unable to walk

LUMBAR SPINE - COMPLETE 4+ VIEW

[view not recorded (1 of 5)]
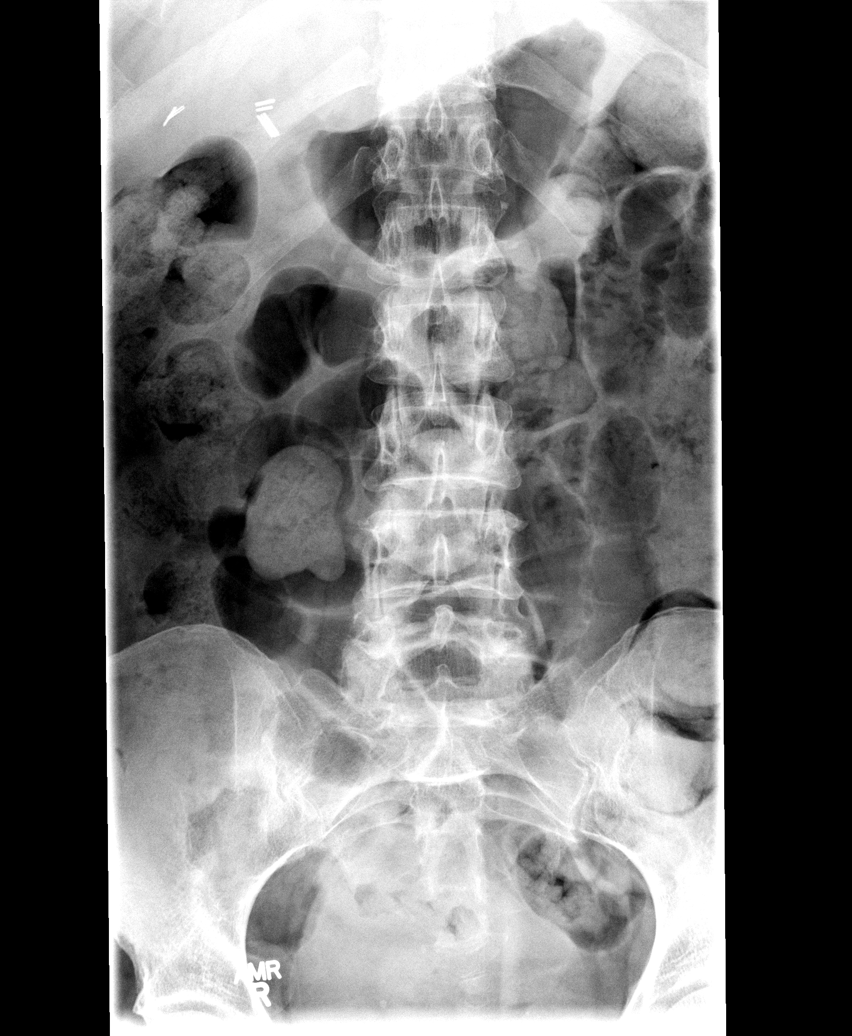

[view not recorded (2 of 5)]
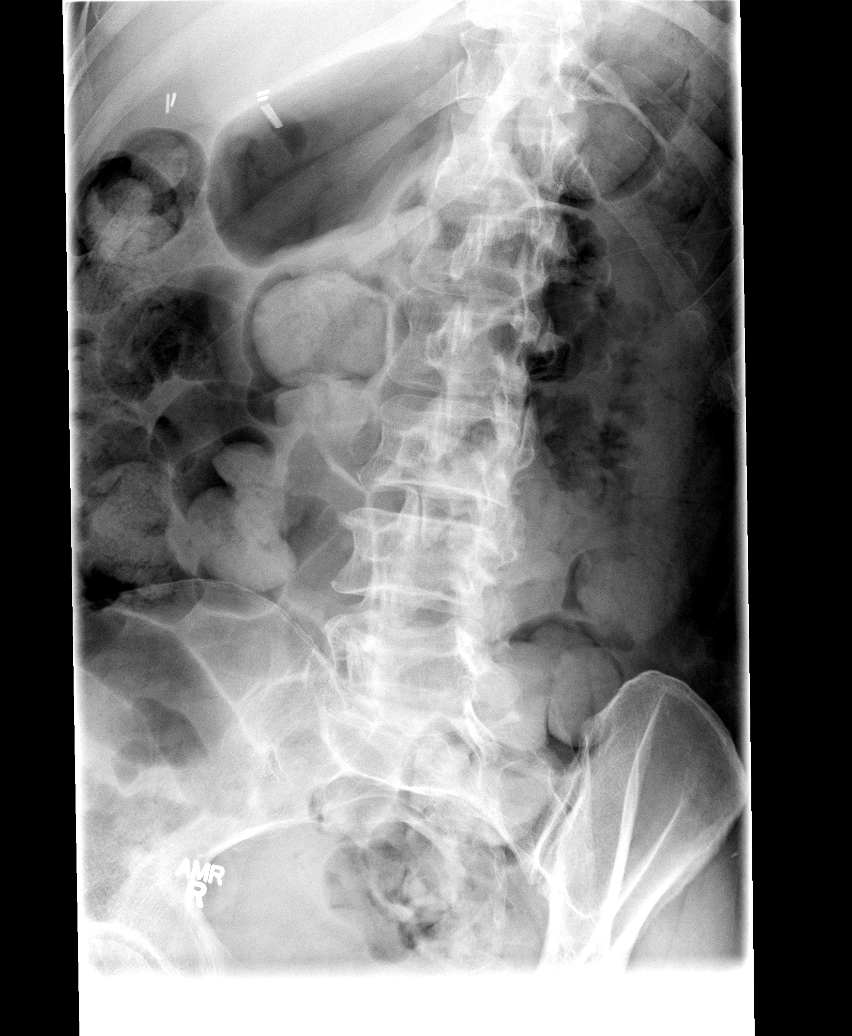

[view not recorded (3 of 5)]
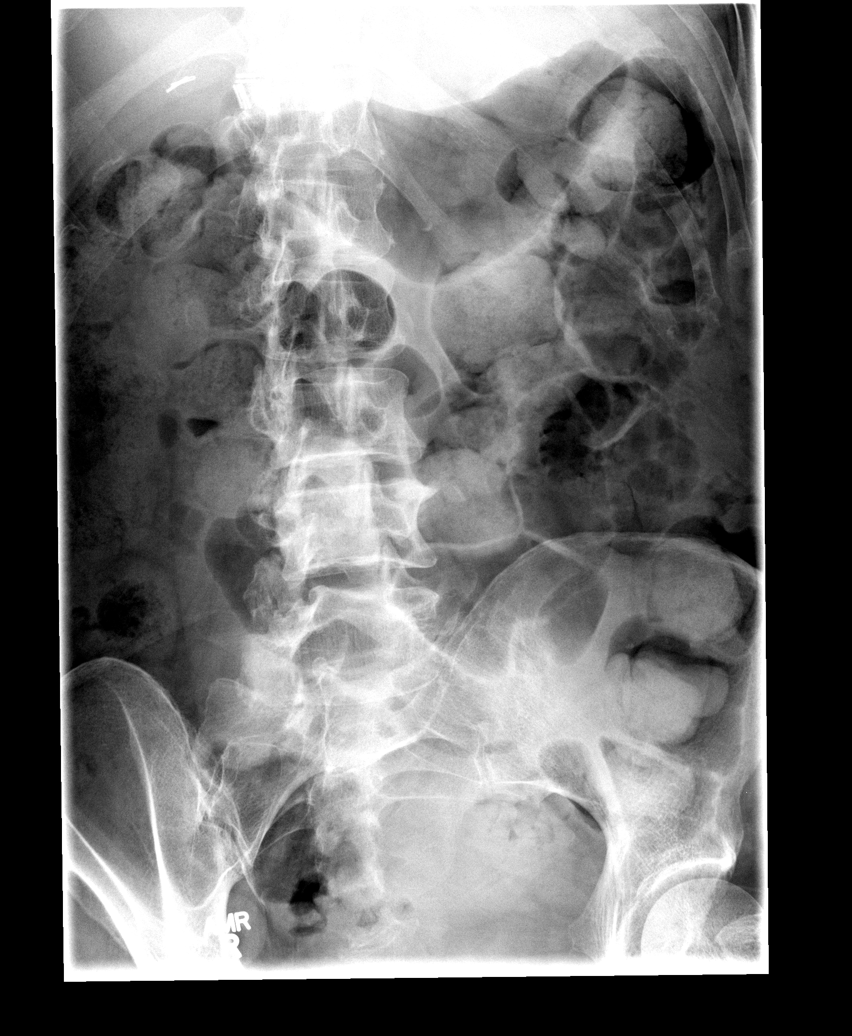

[view not recorded (4 of 5)]
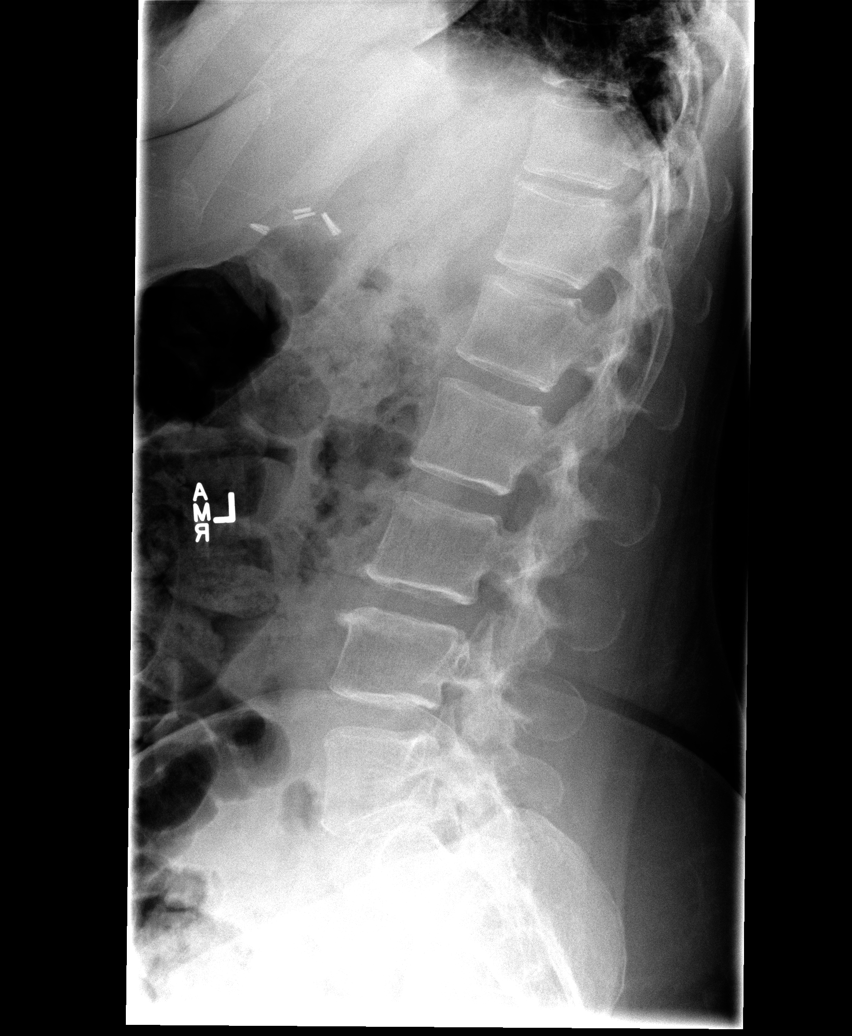

[view not recorded (5 of 5)]
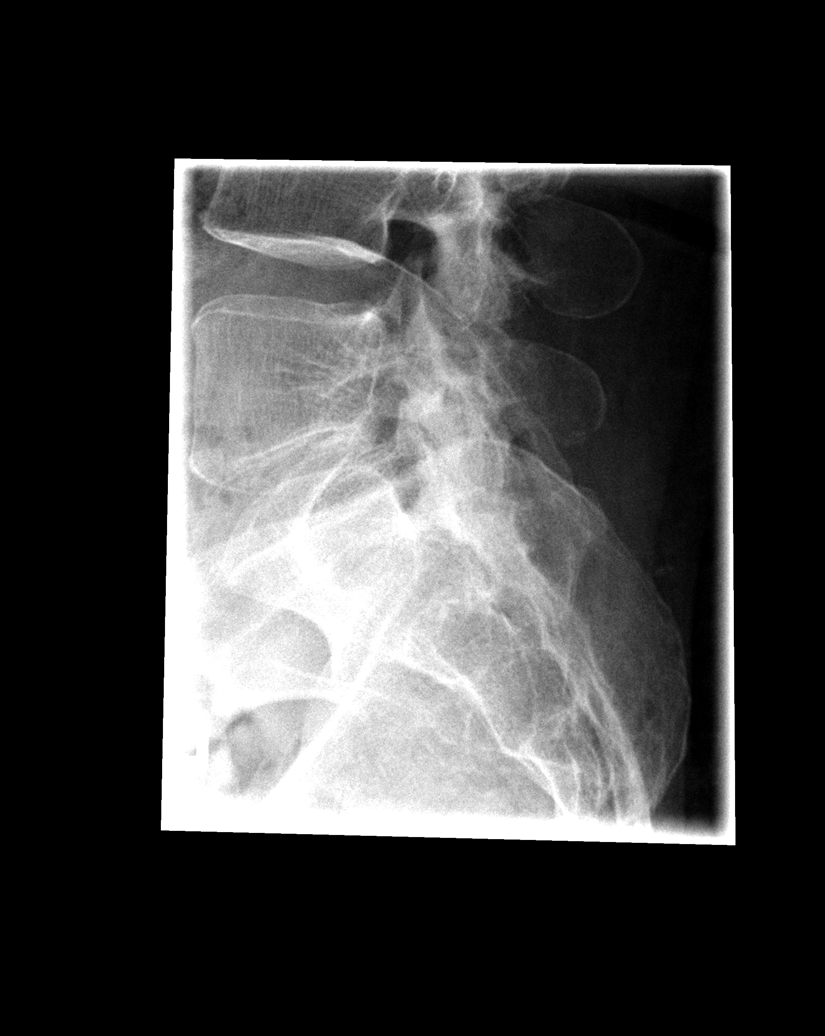

[5 of 5 positions shown; findings below may reference images not displayed]

FINDINGS: Interval progression of L5-S1 degenerative disc disease
with progressive disc height loss and probable neural foraminal
stenosis.  No evidence of acute fracture, or malalignment.
Sclerosis of the L4-L5 L5-S1 facets consistent with underlying
facet arthropathy.  Surgical clips in the right upper quadrant
suggest prior cholecystectomy.  Visualized bowel gas pattern is
unremarkable.
IMPRESSION: Interval loss of disc space height at L5-S1 with increased facet
arthropathy and now likely neural foraminal stenosis.  MRI of the
lumbar spine could further evaluate if clinically warranted.

Lower lumbar facet arthropathy.

## 2013-06-01 IMAGING — MR MR LUMBAR SPINE W/O CM
4 of 5 series · 13 of 48 positions shown · non-contrast
Comparison: Lumbar spine MRI 03/15/2011.  Lumbar spine radiographs
09/21/2011.

CLINICAL DATA: Low back pain extending into the right hip and leg.

MRI LUMBAR SPINE WITHOUT CONTRAST
TECHNIQUE: Multiplanar and multiecho pulse sequences of the lumbar
spine were obtained without intravenous contrast.

[Series 3: T2 · sagittal · 4.0mm · 0.39mm/px · 4 of 14 slices shown (1 of 2)]
[im 1/14]
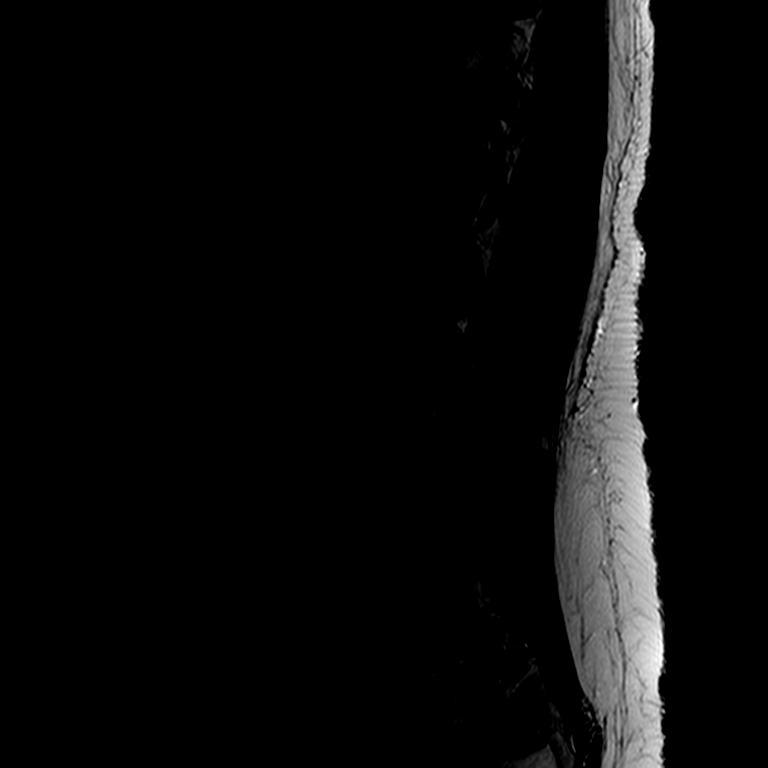
[im 4/14]
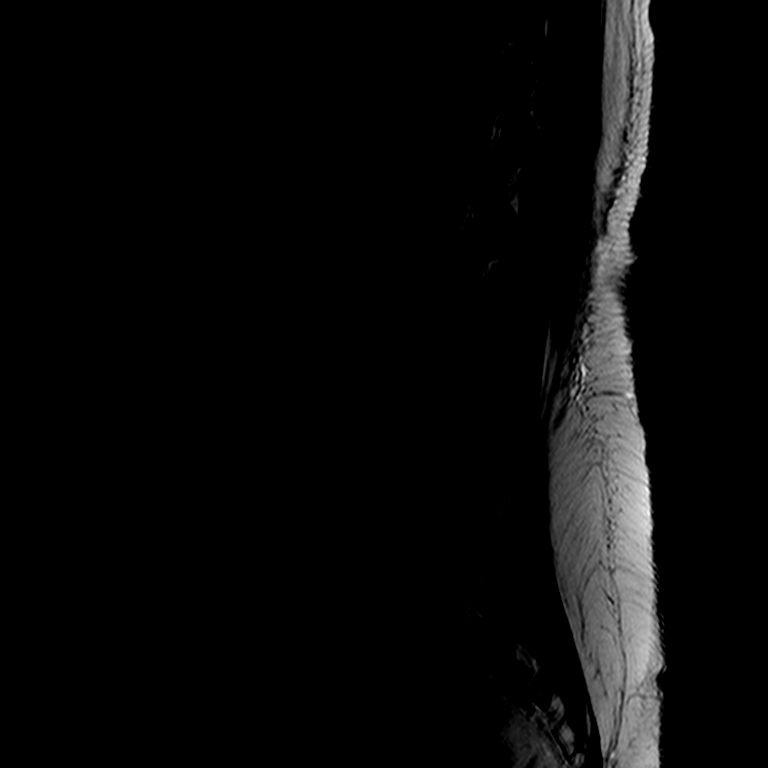
[im 7/14]
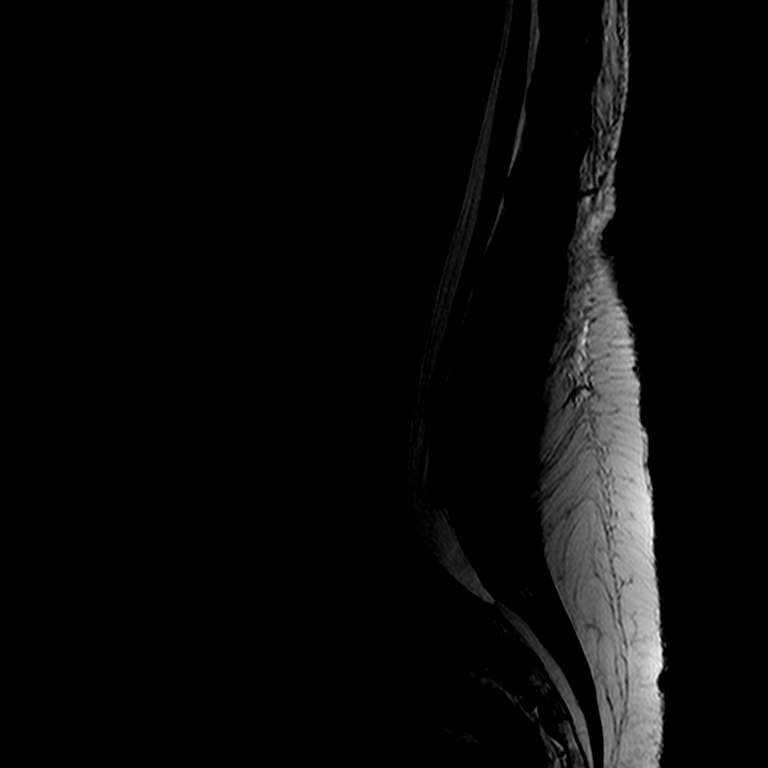
[im 14/14]
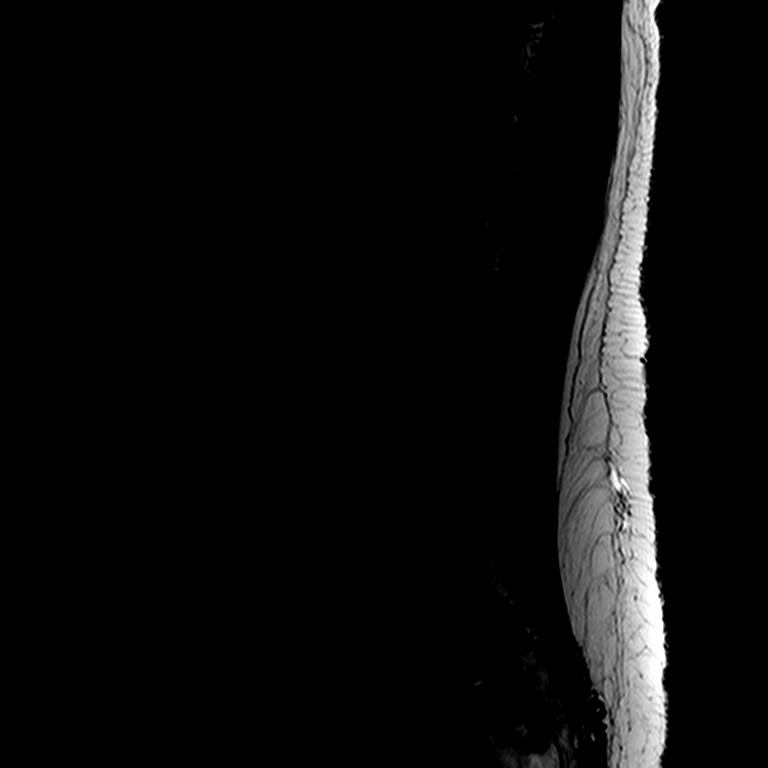

[Series 4: T1 · sagittal · 4.0mm · 0.47mm/px · 3 of 14 slices shown (1 of 2)]
[im 1/14]
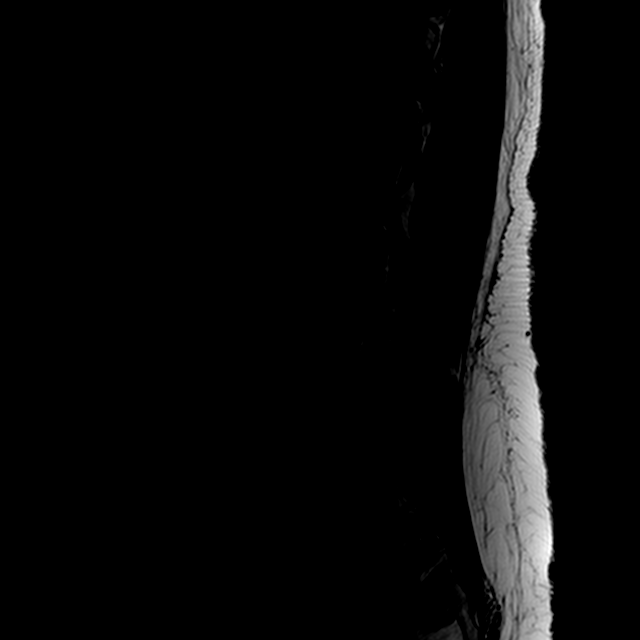
[im 7/14]
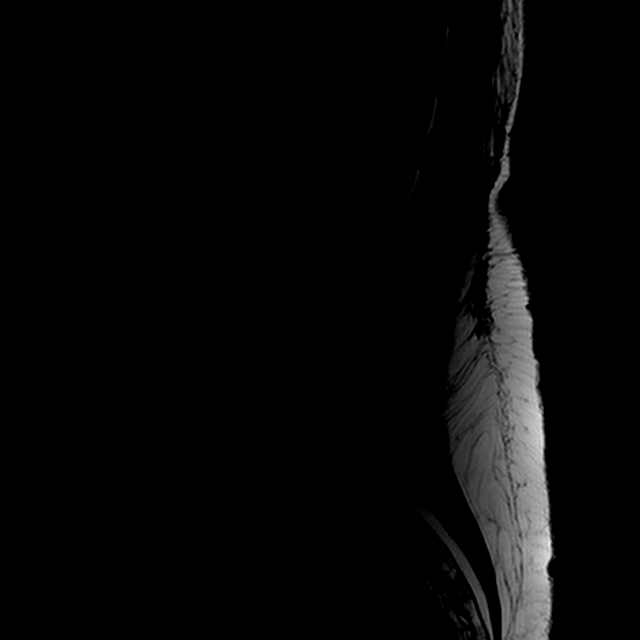
[im 14/14]
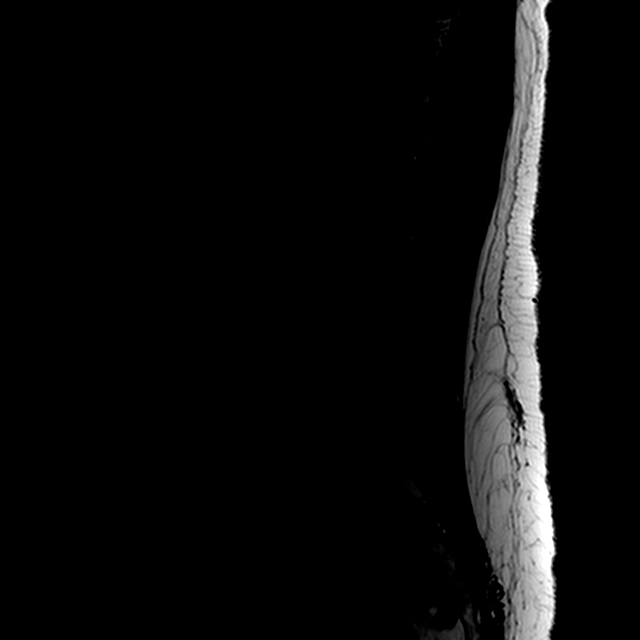

[Series 6: T2 · axial · 4.0mm · 0.27mm/px · z∈[-87,+48]mm · 3 of 40 slices shown (2 of 2)]
[im 6/40]
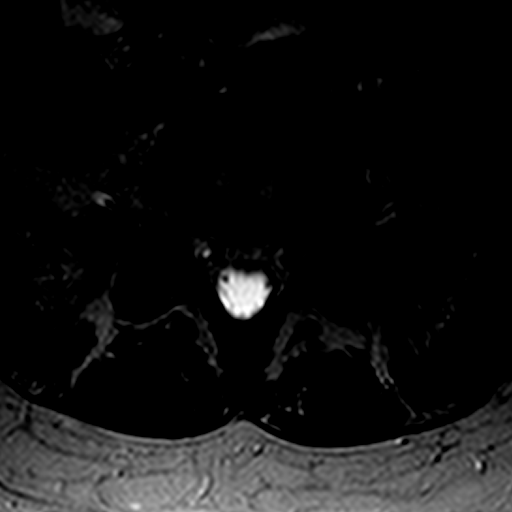
[im 21/40]
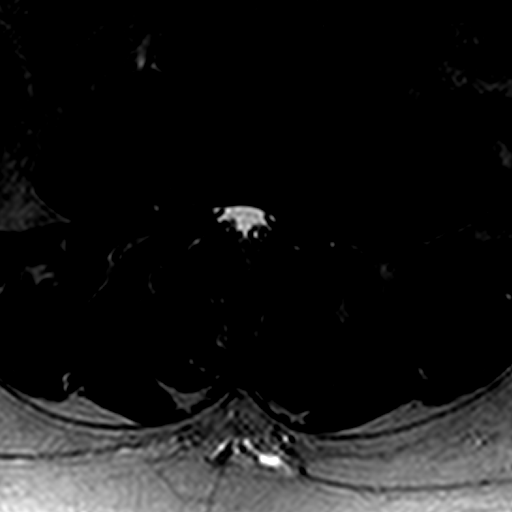
[im 34/40]
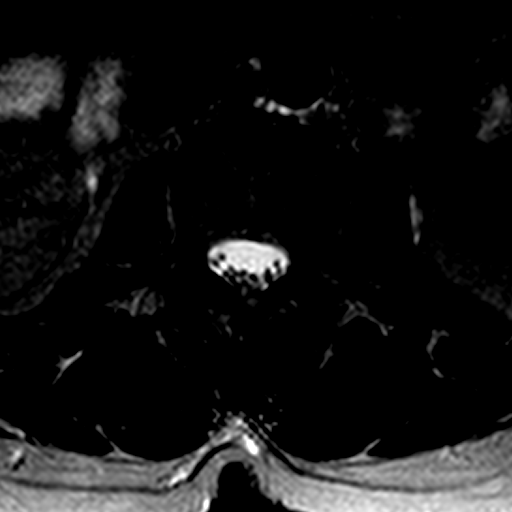

[Series 7: T1 · axial · 4.0mm · 0.25mm/px · z∈[-88,+49]mm · 3 of 40 slices shown (2 of 2)]
[im 6/40]
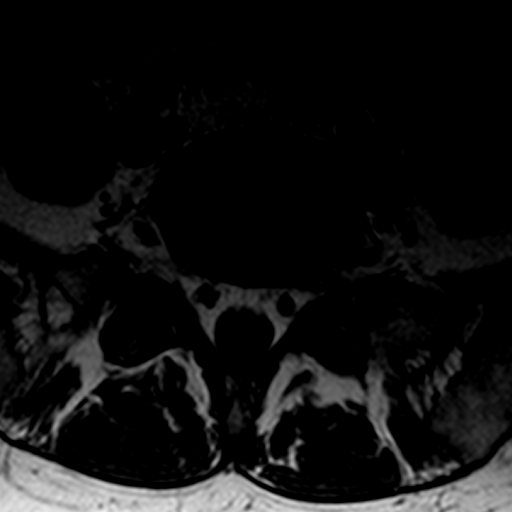
[im 21/40]
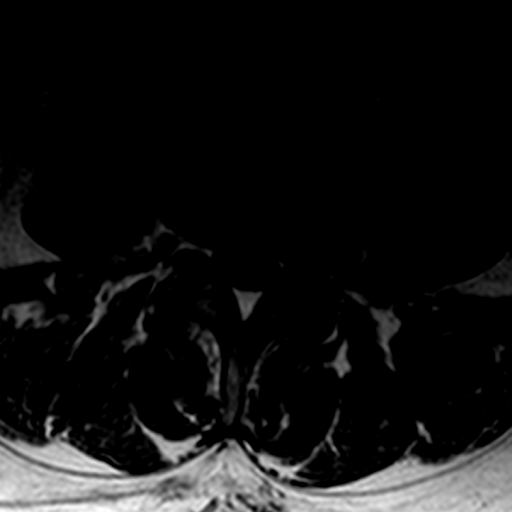
[im 34/40]
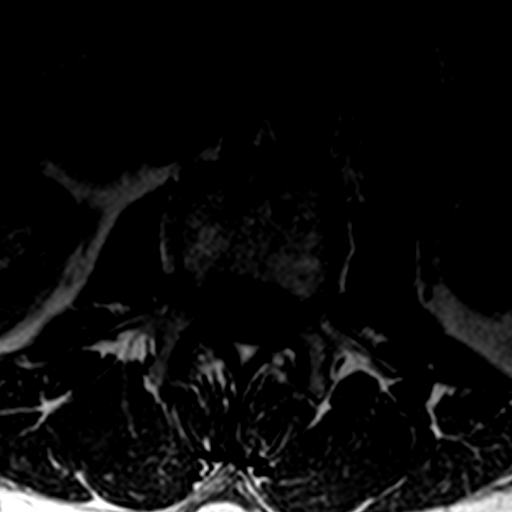

[13 of 48 positions shown; findings below may reference images not displayed]

FINDINGS: Normal signals present in the conus medullaris which
terminates at L1, within normal limits.  Marrow signal, vertebral
body heights, and alignment are normal.

Limited imaging of the abdomen is unremarkable.

The disc levels at L2-3 and above are normal.

L3-4:  A leftward disc herniation is similar to the prior exam.
Mild left lateral recess and moderate left foraminal stenosis is
evident.  Mild foraminal narrowing on the right is stable.

L4-5:  A broad-based disc herniation is asymmetric to the left.
Mild lateral recess narrowing is worse left than right.  Moderate
left and mild right foraminal stenosis is stable.

L5-S1:  Negative.
IMPRESSION: 1.  Stable mild left lateral recess and moderate left foraminal
stenosis at L3-4.
2.  Stable mild right foraminal narrowing at L3-4.
4.  Stable leftward disc herniation at L4-5 with left greater than
right lateral recess and foraminal stenosis.

## 2013-06-08 ENCOUNTER — Ambulatory Visit (HOSPITAL_COMMUNITY)
Admission: RE | Admit: 2013-06-08 | Discharge: 2013-06-08 | Disposition: A | Discharge status: Home / Self Care | Payer: Medicare Other | Source: Ambulatory Visit | Attending: Family Medicine | Admitting: Family Medicine

## 2013-06-08 ENCOUNTER — Other Ambulatory Visit (HOSPITAL_COMMUNITY): Payer: Self-pay | Admitting: Family Medicine

## 2013-06-08 DIAGNOSIS — M542 Cervicalgia: Secondary | ICD-10-CM

## 2013-06-08 DIAGNOSIS — Z6830 Body mass index (BMI) 30.0-30.9, adult: Secondary | ICD-10-CM | POA: Diagnosis not present

## 2013-06-08 DIAGNOSIS — IMO0002 Reserved for concepts with insufficient information to code with codable children: Secondary | ICD-10-CM | POA: Insufficient documentation

## 2013-06-08 DIAGNOSIS — S0993XA Unspecified injury of face, initial encounter: Secondary | ICD-10-CM | POA: Diagnosis not present

## 2013-06-08 DIAGNOSIS — W19XXXA Unspecified fall, initial encounter: Secondary | ICD-10-CM | POA: Insufficient documentation

## 2013-06-14 ENCOUNTER — Ambulatory Visit (HOSPITAL_COMMUNITY)
Admission: RE | Admit: 2013-06-14 | Discharge: 2013-06-14 | Disposition: A | Discharge status: Home / Self Care | Payer: Medicare Other | Source: Ambulatory Visit | Attending: Family Medicine | Admitting: Family Medicine

## 2013-06-14 ENCOUNTER — Other Ambulatory Visit (HOSPITAL_COMMUNITY): Payer: Self-pay | Admitting: Family Medicine

## 2013-06-14 DIAGNOSIS — J189 Pneumonia, unspecified organism: Secondary | ICD-10-CM | POA: Diagnosis not present

## 2013-06-14 DIAGNOSIS — S0990XA Unspecified injury of head, initial encounter: Secondary | ICD-10-CM | POA: Diagnosis not present

## 2013-06-14 DIAGNOSIS — R42 Dizziness and giddiness: Secondary | ICD-10-CM | POA: Insufficient documentation

## 2013-06-14 DIAGNOSIS — Z6830 Body mass index (BMI) 30.0-30.9, adult: Secondary | ICD-10-CM | POA: Diagnosis not present

## 2013-06-14 DIAGNOSIS — R51 Headache: Secondary | ICD-10-CM | POA: Insufficient documentation

## 2013-06-14 DIAGNOSIS — S069X9S Unspecified intracranial injury with loss of consciousness of unspecified duration, sequela: Secondary | ICD-10-CM | POA: Diagnosis not present

## 2013-06-15 ENCOUNTER — Emergency Department (HOSPITAL_COMMUNITY): Payer: Medicare Other

## 2013-06-15 ENCOUNTER — Encounter (HOSPITAL_COMMUNITY): Payer: Self-pay | Admitting: Emergency Medicine

## 2013-06-15 ENCOUNTER — Emergency Department (HOSPITAL_COMMUNITY)
Admission: EM | Admit: 2013-06-15 | Discharge: 2013-06-15 | Disposition: A | Discharge status: Home / Self Care | Payer: Medicare Other | Attending: Emergency Medicine | Admitting: Emergency Medicine

## 2013-06-15 DIAGNOSIS — F17200 Nicotine dependence, unspecified, uncomplicated: Secondary | ICD-10-CM | POA: Diagnosis not present

## 2013-06-15 DIAGNOSIS — Z8669 Personal history of other diseases of the nervous system and sense organs: Secondary | ICD-10-CM | POA: Diagnosis not present

## 2013-06-15 DIAGNOSIS — J069 Acute upper respiratory infection, unspecified: Secondary | ICD-10-CM

## 2013-06-15 DIAGNOSIS — Z9861 Coronary angioplasty status: Secondary | ICD-10-CM | POA: Insufficient documentation

## 2013-06-15 DIAGNOSIS — E2749 Other adrenocortical insufficiency: Secondary | ICD-10-CM | POA: Diagnosis not present

## 2013-06-15 DIAGNOSIS — I509 Heart failure, unspecified: Secondary | ICD-10-CM | POA: Insufficient documentation

## 2013-06-15 DIAGNOSIS — Z79899 Other long term (current) drug therapy: Secondary | ICD-10-CM | POA: Insufficient documentation

## 2013-06-15 DIAGNOSIS — R42 Dizziness and giddiness: Secondary | ICD-10-CM | POA: Insufficient documentation

## 2013-06-15 DIAGNOSIS — I2510 Atherosclerotic heart disease of native coronary artery without angina pectoris: Secondary | ICD-10-CM | POA: Diagnosis not present

## 2013-06-15 DIAGNOSIS — E785 Hyperlipidemia, unspecified: Secondary | ICD-10-CM | POA: Diagnosis not present

## 2013-06-15 DIAGNOSIS — I10 Essential (primary) hypertension: Secondary | ICD-10-CM | POA: Diagnosis not present

## 2013-06-15 DIAGNOSIS — F0781 Postconcussional syndrome: Secondary | ICD-10-CM | POA: Insufficient documentation

## 2013-06-15 DIAGNOSIS — F411 Generalized anxiety disorder: Secondary | ICD-10-CM | POA: Diagnosis not present

## 2013-06-15 DIAGNOSIS — K3184 Gastroparesis: Secondary | ICD-10-CM | POA: Diagnosis not present

## 2013-06-15 DIAGNOSIS — G8929 Other chronic pain: Secondary | ICD-10-CM | POA: Insufficient documentation

## 2013-06-15 DIAGNOSIS — S060X0S Concussion without loss of consciousness, sequela: Secondary | ICD-10-CM

## 2013-06-15 DIAGNOSIS — Z7982 Long term (current) use of aspirin: Secondary | ICD-10-CM | POA: Insufficient documentation

## 2013-06-15 DIAGNOSIS — Z880 Allergy status to penicillin: Secondary | ICD-10-CM | POA: Diagnosis not present

## 2013-06-15 DIAGNOSIS — E039 Hypothyroidism, unspecified: Secondary | ICD-10-CM | POA: Diagnosis not present

## 2013-06-15 DIAGNOSIS — R51 Headache: Secondary | ICD-10-CM | POA: Insufficient documentation

## 2013-06-15 DIAGNOSIS — S060X0A Concussion without loss of consciousness, initial encounter: Secondary | ICD-10-CM | POA: Diagnosis not present

## 2013-06-15 DIAGNOSIS — R05 Cough: Secondary | ICD-10-CM | POA: Diagnosis not present

## 2013-06-15 HISTORY — DX: Concussion with loss of consciousness status unknown, initial encounter: S06.0XAA

## 2013-06-15 HISTORY — DX: Concussion with loss of consciousness of unspecified duration, initial encounter: S06.0X9A

## 2013-06-15 LAB — COMPREHENSIVE METABOLIC PANEL
ALT: 10 U/L (ref 0–35)
Albumin: 3.4 g/dL — ABNORMAL LOW (ref 3.5–5.2)
Alkaline Phosphatase: 84 U/L (ref 39–117)
BUN: 8 mg/dL (ref 6–23)
Calcium: 9.4 mg/dL (ref 8.4–10.5)
Chloride: 101 mEq/L (ref 96–112)
Creatinine, Ser: 0.91 mg/dL (ref 0.50–1.10)
GFR calc non Af Amer: 73 mL/min — ABNORMAL LOW (ref >90)
Glucose, Bld: 120 mg/dL — ABNORMAL HIGH (ref 70–99)
Potassium: 3.8 mEq/L (ref 3.5–5.1)
Sodium: 141 mEq/L (ref 135–145)
Total Bilirubin: 0.3 mg/dL (ref 0.3–1.2)
Total Protein: 7.3 g/dL (ref 6.0–8.3)

## 2013-06-15 LAB — CBC WITH DIFFERENTIAL/PLATELET
Basophils Relative: 0 % (ref 0–1)
Eosinophils Absolute: 0.1 10*3/uL (ref 0.0–0.7)
Hemoglobin: 14.7 g/dL (ref 12.0–15.0)
Lymphs Abs: 2.2 10*3/uL (ref 0.7–4.0)
MCH: 29.2 pg (ref 26.0–34.0)
Monocytes Absolute: 0.4 10*3/uL (ref 0.1–1.0)
Monocytes Relative: 3 % (ref 3–12)
Neutro Abs: 9.3 10*3/uL — ABNORMAL HIGH (ref 1.7–7.7)
Neutrophils Relative %: 77 % (ref 43–77)
Platelets: 382 10*3/uL (ref 150–400)
RBC: 5.04 MIL/uL (ref 3.87–5.11)
RDW: 15.1 % (ref 11.5–15.5)

## 2013-06-15 LAB — URINALYSIS, ROUTINE W REFLEX MICROSCOPIC
Bilirubin Urine: NEGATIVE
Glucose, UA: NEGATIVE mg/dL
Ketones, ur: NEGATIVE mg/dL
Protein, ur: NEGATIVE mg/dL
pH: 6.5 (ref 5.0–8.0)

## 2013-06-15 MED ORDER — MECLIZINE HCL 25 MG PO TABS: 25.0000 mg | ORAL_TABLET | Freq: Four times a day (QID) | ORAL | Status: DC | PRN

## 2013-06-15 MED ORDER — SODIUM CHLORIDE 0.9 % IV SOLN
1000.0000 mL | INTRAVENOUS | Status: DC
  Administered 2013-06-15: 1000 mL via INTRAVENOUS

## 2013-06-15 MED ORDER — HYDROCORTISONE SOD SUCCINATE 100 MG IJ SOLR
100.0000 mg | Freq: Once | INTRAMUSCULAR | Status: AC
  Administered 2013-06-15: 100 mg via INTRAVENOUS
  Filled 2013-06-15: qty 2

## 2013-06-15 MED ORDER — IPRATROPIUM BROMIDE 0.02 % IN SOLN
0.5000 mg | Freq: Once | RESPIRATORY_TRACT | Status: AC
  Administered 2013-06-15: 0.5 mg via RESPIRATORY_TRACT
  Filled 2013-06-15: qty 2.5

## 2013-06-15 MED ORDER — ACETAMINOPHEN 325 MG PO TABS
650.0000 mg | ORAL_TABLET | Freq: Once | ORAL | Status: AC
  Administered 2013-06-15: 650 mg via ORAL

## 2013-06-15 MED ORDER — ACETAMINOPHEN 325 MG PO TABS
ORAL_TABLET | ORAL | Status: AC
  Filled 2013-06-15: qty 2

## 2013-06-15 MED ORDER — SODIUM CHLORIDE 0.9 % IV SOLN
1000.0000 mL | Freq: Once | INTRAVENOUS | Status: AC
  Administered 2013-06-15: 1000 mL via INTRAVENOUS

## 2013-06-15 MED ORDER — ALBUTEROL SULFATE (5 MG/ML) 0.5% IN NEBU
5.0000 mg | INHALATION_SOLUTION | Freq: Once | RESPIRATORY_TRACT | Status: AC
  Administered 2013-06-15: 5 mg via RESPIRATORY_TRACT
  Filled 2013-06-15: qty 1
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
## 2013-06-15 NOTE — ED Notes (Signed)
Patient with no complaints at this time. Respirations even and unlabored. Skin warm/dry. Discharge instructions reviewed with patient at this time. Patient given opportunity to voice concerns/ask questions. IV removed per policy and band-aid applied to site. Patient discharged at this time and left Emergency Department with steady gait.  

## 2013-06-15 NOTE — ED Notes (Signed)
Pt reports having a concussion sept. 28th, has been dizzy and lightheaded since. Has now developed a cough, congestion and fever, started on antibiotic yesterday and was told that she may have pneumonia. Feeling worse today.

## 2013-06-28 DIAGNOSIS — F411 Generalized anxiety disorder: Secondary | ICD-10-CM | POA: Diagnosis not present

## 2013-06-28 DIAGNOSIS — G8929 Other chronic pain: Secondary | ICD-10-CM | POA: Diagnosis not present

## 2013-06-28 DIAGNOSIS — Z6830 Body mass index (BMI) 30.0-30.9, adult: Secondary | ICD-10-CM | POA: Diagnosis not present

## 2013-07-24 ENCOUNTER — Other Ambulatory Visit (HOSPITAL_COMMUNITY)
Admission: RE | Admit: 2013-07-24 | Discharge: 2013-07-24 | Disposition: A | Discharge status: Home / Self Care | Payer: Medicare Other | Source: Ambulatory Visit | Attending: Gynecology | Admitting: Gynecology

## 2013-07-24 ENCOUNTER — Encounter: Payer: Self-pay | Admitting: Gynecology

## 2013-07-24 ENCOUNTER — Ambulatory Visit (INDEPENDENT_AMBULATORY_CARE_PROVIDER_SITE_OTHER): Payer: Medicare Other | Admitting: Gynecology

## 2013-07-24 VITALS — BP 128/86 | Ht 65.25 in | Wt 194.5 lb

## 2013-07-24 DIAGNOSIS — N951 Menopausal and female climacteric states: Secondary | ICD-10-CM | POA: Diagnosis not present

## 2013-07-24 DIAGNOSIS — Z124 Encounter for screening for malignant neoplasm of cervix: Secondary | ICD-10-CM | POA: Insufficient documentation

## 2013-07-24 DIAGNOSIS — E2749 Other adrenocortical insufficiency: Secondary | ICD-10-CM

## 2013-07-24 DIAGNOSIS — Z113 Encounter for screening for infections with a predominantly sexual mode of transmission: Secondary | ICD-10-CM | POA: Diagnosis not present

## 2013-07-24 DIAGNOSIS — R87810 Cervical high risk human papillomavirus (HPV) DNA test positive: Secondary | ICD-10-CM | POA: Insufficient documentation

## 2013-07-24 DIAGNOSIS — E271 Primary adrenocortical insufficiency: Secondary | ICD-10-CM

## 2013-07-24 DIAGNOSIS — Z20828 Contact with and (suspected) exposure to other viral communicable diseases: Secondary | ICD-10-CM | POA: Diagnosis not present

## 2013-07-24 DIAGNOSIS — Z01419 Encounter for gynecological examination (general) (routine) without abnormal findings: Secondary | ICD-10-CM

## 2013-07-24 DIAGNOSIS — Z1272 Encounter for screening for malignant neoplasm of vagina: Secondary | ICD-10-CM | POA: Diagnosis not present

## 2013-07-24 DIAGNOSIS — Z1151 Encounter for screening for human papillomavirus (HPV): Secondary | ICD-10-CM | POA: Insufficient documentation

## 2013-07-24 NOTE — Addendum Note (Signed)
Addended by: Bertram Savin A on: 07/24/2013 04:33 PM   Modules accepted: Orders

## 2013-07-24 NOTE — Patient Instructions (Addendum)
Smoking Cessation Quitting smoking is important to your health and has many advantages. However, it is not always easy to quit since nicotine is a very addictive drug. Often times, people try 3 times or more before being able to quit. This document explains the best ways for you to prepare to quit smoking. Quitting takes hard work and a lot of effort, but you can do it. ADVANTAGES OF QUITTING SMOKING  You will live longer, feel better, and live better.  Your body will feel the impact of quitting smoking almost immediately.  Within 20 minutes, blood pressure decreases. Your pulse returns to its normal level.  After 8 hours, carbon monoxide levels in the blood return to normal. Your oxygen level increases.  After 24 hours, the chance of having a heart attack starts to decrease. Your breath, hair, and body stop smelling like smoke.  After 48 hours, damaged nerve endings begin to recover. Your sense of taste and smell improve.  After 72 hours, the body is virtually free of nicotine. Your bronchial tubes relax and breathing becomes easier.  After 2 to 12 weeks, lungs can hold more air. Exercise becomes easier and circulation improves.  The risk of having a heart attack, stroke, cancer, or lung disease is greatly reduced.  After 1 year, the risk of coronary heart disease is cut in half.  After 5 years, the risk of stroke falls to the same as a nonsmoker.  After 10 years, the risk of lung cancer is cut in half and the risk of other cancers decreases significantly.  After 15 years, the risk of coronary heart disease drops, usually to the level of a nonsmoker.  If you are pregnant, quitting smoking will improve your chances of having a healthy baby.  The people you live with, especially any children, will be healthier.  You will have extra money to spend on things other than cigarettes. QUESTIONS TO THINK ABOUT BEFORE ATTEMPTING TO QUIT You may want to talk about your answers with your  caregiver.  Why do you want to quit?  If you tried to quit in the past, what helped and what did not?  What will be the most difficult situations for you after you quit? How will you plan to handle them?  Who can help you through the tough times? Your family? Friends? A caregiver?  What pleasures do you get from smoking? What ways can you still get pleasure if you quit? Here are some questions to ask your caregiver:  How can you help me to be successful at quitting?  What medicine do you think would be best for me and how should I take it?  What should I do if I need more help?  What is smoking withdrawal like? How can I get information on withdrawal? GET READY  Set a quit date.  Change your environment by getting rid of all cigarettes, ashtrays, matches, and lighters in your home, car, or work. Do not let people smoke in your home.  Review your past attempts to quit. Think about what worked and what did not. GET SUPPORT AND ENCOURAGEMENT You have a better chance of being successful if you have help. You can get support in many ways.  Tell your family, friends, and co-workers that you are going to quit and need their support. Ask them not to smoke around you.  Get individual, group, or telephone counseling and support. Programs are available at local hospitals and health centers. Call your local health department for   information about programs in your area.  Spiritual beliefs and practices may help some smokers quit.  Download a "quit meter" on your computer to keep track of quit statistics, such as how long you have gone without smoking, cigarettes not smoked, and money saved.  Get a self-help book about quitting smoking and staying off of tobacco. LEARN NEW SKILLS AND BEHAVIORS  Distract yourself from urges to smoke. Talk to someone, go for a walk, or occupy your time with a task.  Change your normal routine. Take a different route to work. Drink tea instead of coffee.  Eat breakfast in a different place.  Reduce your stress. Take a hot bath, exercise, or read a book.  Plan something enjoyable to do every day. Reward yourself for not smoking.  Explore interactive web-based programs that specialize in helping you quit. GET MEDICINE AND USE IT CORRECTLY Medicines can help you stop smoking and decrease the urge to smoke. Combining medicine with the above behavioral methods and support can greatly increase your chances of successfully quitting smoking.  Nicotine replacement therapy helps deliver nicotine to your body without the negative effects and risks of smoking. Nicotine replacement therapy includes nicotine gum, lozenges, inhalers, nasal sprays, and skin patches. Some may be available over-the-counter and others require a prescription.  Antidepressant medicine helps people abstain from smoking, but how this works is unknown. This medicine is available by prescription.  Nicotinic receptor partial agonist medicine simulates the effect of nicotine in your brain. This medicine is available by prescription. Ask your caregiver for advice about which medicines to use and how to use them based on your health history. Your caregiver will tell you what side effects to look out for if you choose to be on a medicine or therapy. Carefully read the information on the package. Do not use any other product containing nicotine while using a nicotine replacement product.  RELAPSE OR DIFFICULT SITUATIONS Most relapses occur within the first 3 months after quitting. Do not be discouraged if you start smoking again. Remember, most people try several times before finally quitting. You may have symptoms of withdrawal because your body is used to nicotine. You may crave cigarettes, be irritable, feel very hungry, cough often, get headaches, or have difficulty concentrating. The withdrawal symptoms are only temporary. They are strongest when you first quit, but they will go away within  10 14 days. To reduce the chances of relapse, try to:  Avoid drinking alcohol. Drinking lowers your chances of successfully quitting.  Reduce the amount of caffeine you consume. Once you quit smoking, the amount of caffeine in your body increases and can give you symptoms, such as a rapid heartbeat, sweating, and anxiety.  Avoid smokers because they can make you want to smoke.  Do not let weight gain distract you. Many smokers will gain weight when they quit, usually less than 10 pounds. Eat a healthy diet and stay active. You can always lose the weight gained after you quit.  Find ways to improve your mood other than smoking. FOR MORE INFORMATION  www.smokefree.gov  Document Released: 08/17/2001 Document Revised: 02/22/2012 Document Reviewed: 12/02/2011 [facility] Patient Information 2014 [place], [place]. Perimenopause Perimenopause is the time when your body begins to move into the menopause (no menstrual period for 12 straight months). It is a natural process. Perimenopause can begin 2 to 8 years before the menopause and usually lasts for one year after the menopause. During this time, your ovaries may or may not produce an egg. The  ovaries vary in their production of estrogen and progesterone hormones each month. This can cause irregular menstrual periods, difficulty in getting pregnant, vaginal bleeding between periods and uncomfortable symptoms. CAUSES  Irregular production of the ovarian hormones, estrogen and progesterone, and not ovulating every month.  Other causes include:  Tumor of the pituitary gland in the brain.  Medical disease that affects the ovaries.  Radiation treatment.  Chemotherapy.  Unknown causes.  Heavy smoking and excessive alcohol intake can bring on perimenopause sooner. SYMPTOMS   Hot flashes.  Night sweats.  Irregular menstrual periods.  Decrease sex drive.  Vaginal dryness.  Headaches.  Mood swings.  Depression.  Memory  problems.  Irritability.  Tiredness.  Weight gain.  Trouble getting pregnant.  The beginning of losing bone cells (osteoporosis).  The beginning of hardening of the arteries (atherosclerosis). DIAGNOSIS  Your caregiver will make a diagnosis by analyzing your age, menstrual history and your symptoms. They will do a physical exam noting any changes in your body, especially your female organs. Female hormone tests may or may not be helpful depending on the amount and when you produce the female hormones. However, other hormone tests may be helpful (ex. thyroid hormone) to rule out other problems. TREATMENT  The decision to treat during the perimenopause should be made by you and your caregiver depending on how the symptoms are affecting you and your life style. There are various treatments available such as:  Treating individual symptoms with a specific medication for that symptom (ex. tranquilizer for depression).  Herbal medications that can help specific symptoms.  Counseling.  Group therapy.  No treatment. HOME CARE INSTRUCTIONS   Before seeing your caregiver, make a list of your menstrual periods (when the occur, how heavy they are, how long between periods and how long they last), your symptoms and when they started.  Take the medication as recommended by your caregiver.  Sleep and rest.  Exercise.  Eat a diet that contains calcium (good for your bones) and soy (acts like estrogen hormone).  Do not smoke.  Avoid alcoholic beverages.  Taking vitamin E may help in certain cases.  Take calcium and vitamin D supplements to help prevent bone loss.  Group therapy is sometimes helpful.  Acupuncture may help in some cases. SEEK MEDICAL CARE IF:   You have any of the above and want to know if it is perimenopause.  You want advice and treatment for any of your symptoms mentioned above.  You need a referral to a specialist (gynecologist, psychiatrist or  psychologist). SEEK IMMEDIATE MEDICAL CARE IF:   You have vaginal bleeding.  Your period lasts longer than 8 days.  You periods are recurring sooner than 21 days.  You have bleeding after intercourse.  You have severe depression.  You have pain when you urinate.  You have severe headaches.  You develop vision problems. Document Released: 09/30/2004 Document Revised: 11/15/2011 Document Reviewed: 03/22/2013 [facility] Patient Information 2014 [place], [place]. Bone Densitometry Bone densitometry is a special X-ray that measures your bone density and can be used to help predict your risk of bone fractures. This test is used to determine bone mineral content and density to diagnose osteoporosis. Osteoporosis is the loss of bone that may cause the bone to become weak. Osteoporosis commonly occurs in women entering menopause. However, it may be found in men and in people with other diseases. PREPARATION FOR TEST No preparation necessary. WHO SHOULD BE TESTED?  All women older than 16.  Postmenopausal women (50  to 28) with risk factors for osteoporosis.  People with a previous fracture caused by normal activities.  People with a small body frame (less than 127 poundsor a body mass index [BMI] of less than 21).  People who have a parent with a hip fracture or history of osteoporosis.  People who smoke.  People who have rheumatoid arthritis.  Anyone who engages in excessive alcohol use (more than 3 drinks most days).  Women who experience early menopause. WHEN SHOULD YOU BE RETESTED? Current guidelines suggest that you should wait at least 2 years before doing a bone density test again if your first test was normal.Recent studies indicated that women with normal bone density may be able to wait a few years before needing to repeat a bone density test. You should discuss this with your caregiver.  NORMAL FINDINGS   Normal: less than standard deviation below normal (greater than  -1).  Osteopenia: 1 to 2.5 standard deviations below normal (-1 to -2.5).  Osteoporosis: greater than 2.5 standard deviations below normal (less than -2.5). Test results are reported as a "T score" and a "Z score."The T score is a number that compares your bone density with the bone density of healthy, young women.The Z score is a number that compares your bone density with the scores of women who are the same age, gender, and race.  Ranges for normal findings may vary among different laboratories and hospitals. You should always check with your doctor after having lab work or other tests done to discuss the meaning of your test results and whether your values are considered within normal limits. MEANING OF TEST  Your caregiver will go over the test results with you and discuss the importance and meaning of your results, as well as treatment options and the need for additional tests if necessary. OBTAINING THE TEST RESULTS It is your responsibility to obtain your test results. Ask the lab or department performing the test when and how you will get your results. Document Released: 09/14/2004 Document Revised: 11/15/2011 Document Reviewed: 10/07/2010 [facility] Patient Information 2014 [place], [place]. Hormone Therapy At menopause, your body begins making less estrogen and progesterone hormones. This causes the body to stop having menstrual periods. This is because estrogen and progesterone hormones control your periods and menstrual cycle. A lack of estrogen may cause symptoms such as:  Hot flushes (or hot flashes).  Vaginal dryness.  Dry skin.  Loss of sex drive.  Risk of bone loss (osteoporosis). When this happens, you may choose to take hormone therapy to get back the estrogen lost during menopause. When the hormone estrogen is given alone, it is usually referred to as ET (Estrogen Therapy). When the hormone progestin is combined with estrogen, it is generally called HT (Hormone Therapy).  This was formerly known as hormone replacement therapy (HRT). Your caregiver can help you make a decision on what will be best for you. The decision to use HT seems to change often as new studies are done. Many studies do not agree on the benefits of hormone replacement therapy. LIKELY BENEFITS OF HT INCLUDE PROTECTION FROM:  Hot Flushes (also called hot flashes) - A hot flush is a sudden feeling of heat that spreads over the face and body. The skin may redden like a blush. It is connected with sweats and sleep disturbance. Women going through menopause may have hot flushes a few times a month or several times per day depending on the woman.  Osteoporosis (bone loss)- Estrogen helps guard  against bone loss. After menopause, a woman's bones slowly lose calcium and become weak and brittle. As a result, bones are more likely to break. The hip, wrist, and spine are affected most often. Hormone therapy can help slow bone loss after menopause. Weight bearing exercise and taking calcium with vitamin D also can help prevent bone loss. There are also medications that your caregiver can prescribe that can help prevent osteoporosis.  Vaginal Dryness - Loss of estrogen causes changes in the vagina. Its lining may become thin and dry. These changes can cause pain and bleeding during sexual intercourse. Dryness can also lead to infections. This can cause burning and itching. (Vaginal estrogen treatment can help relieve pain, itching, and dryness.)  Urinary Tract Infections are more common after menopause because of lack of estrogen. Some women also develop urinary incontinence because of low estrogen levels in the vagina and bladder.  Possible other benefits of estrogen include a positive effect on mood and short-term memory in women. RISKS AND COMPLICATIONS  Using estrogen alone without progesterone causes the lining of the uterus to grow. This increases the risk of lining of the uterus (endometrial) cancer. Your  caregiver should give another hormone called progestin if you have a uterus.  Women who take combined (estrogen and progestin) HT appear to have an increased risk of breast cancer. The risk appears to be small, but increases throughout the time that HT is taken.  Combined therapy also makes the breast tissue slightly denser which makes it harder to read mammograms (breast X-rays).  Combined, estrogen and progesterone therapy can be taken together every day, in which case there may be spotting of blood. HT therapy can be taken cyclically in which case you will have menstrual periods. Cyclically means HT is taken for a set amount of days, then not taken, then this process is repeated.  HT may increase the risk of stroke, heart attack, breast cancer and forming blood clots in your leg.  Transdermal estrogen (estrogen that is absorbed through the skin with a patch or a cream) may have more positive results with:  Cholesterol.  Blood pressure.  Blood clots. Having the following conditions may indicate you should not have HT:  Endometrial cancer.  Liver disease.  Breast cancer.  Heart disease.  History of blood clots.  Stroke. TREATMENT   If you choose to take HT and have a uterus, usually estrogen and progestin are prescribed.  Your caregiver will help you decide the best way to take the medications.  Possible ways to take estrogen include:  Pills.  Patches.  Gels.  Sprays.  Vaginal estrogen cream, rings and tablets.  It is best to take the lowest dose possible that will help your symptoms and take them for the shortest period of time that you can.  Hormone therapy can help relieve some of the problems (symptoms) that affect women at menopause. Before making a decision about HT, talk to your caregiver about what is best for you. Be well informed and comfortable with your decisions. HOME CARE INSTRUCTIONS   Follow your caregivers advice when taking the medications.  A  Pap test is done to screen for cervical cancer.  The first Pap test should be done at age 30.  Between ages 67 and 69, Pap tests are repeated every 2 years.  Beginning at age 50, you are advised to have a Pap test every 3 years as long as your past 3 Pap tests have been normal.  Some women have medical  problems that increase the chance of getting cervical cancer. Talk to your caregiver about these problems. It is especially important to talk to your caregiver if a new problem develops soon after your last Pap test. In these cases, your caregiver may recommend more frequent screening and Pap tests.  The above recommendations are the same for women who have or have not gotten the vaccine for HPV (Human Papillomavirus).  If you had a hysterectomy for a problem that was not a cancer or a condition that could lead to cancer, then you no longer need Pap tests. However, even if you no longer need a Pap test, a regular exam is a good idea to make sure no other problems are starting.   If you are between ages 34 and 25, and you have had normal Pap tests going back 10 years, you no longer need Pap tests. However, even if you no longer need a Pap test, a regular exam is a good idea to make sure no other problems are starting.   If you have had past treatment for cervical cancer or a condition that could lead to cancer, you need Pap tests and screening for cancer for at least 20 years after your treatment.  If Pap tests have been discontinued, risk factors (such as a new sexual partner) need to be re-assessed to determine if screening should be resumed.  Some women may need screenings more often if they are at high risk for cervical cancer.  Get mammograms done as per the advice of your caregiver. SEEK IMMEDIATE MEDICAL CARE IF:  You develop abnormal vaginal bleeding.  You have pain or swelling in your legs, shortness of breath, or chest pain.  You develop dizziness or headaches.  You have  lumps or changes in your breasts or armpits.  You have slurred speech.  You develop weakness or numbness of your arms or legs.  You have pain, burning, or bleeding when urinating.  You develop abdominal pain. Document Released: 05/22/2003 Document Revised: 11/15/2011 Document Reviewed: 09/09/2010 [facility] Patient Information 2014 [place], [place].

## 2013-07-24 NOTE — Progress Notes (Signed)
ICESS BERTONI 02-02-1966 [id number]   History:    47 y.o.  for annual gyn exam has not been seen in this practice since 2011. Patient with history of Addison's disease is being followed by Dr. Lurene Shadow and her PCP is Dr. Sherwood Gambler. Patient also has history of hypothyroidism and hypertension and congestive heart failure as well as atherosclerotic heart disease, acid reflux, mitral valve prolapse. The patient over the past year has been complaining of worsening hot flashes irritability mood swings and insomnia. Patient prior to her hysterectomy a history of dysplasia and several years prior and had cryotherapy. Pathology report from specimen for hysterectomy did not demonstrate any dysplastic changes.   Patient with history of total abdominal hysterectomy back in 2006 an ovarian cystectomy for suspected endometriosis. Patient several years prior to her hysterectomy had had dysplasia of the cervix and treated with cryotherapy. Review pathology report did not demonstrate any dysplastic changes or cervix. Patient mammogram was normal this year. Patient father had history of colon cancer. Patient had a colonoscopy in 2010. Patient herself has had benign colon polyps in the past. Patient smokes 2 cigarettes per day but was at one time smoking 2 packs a respiratory. The patient has not been sexually active but wanted to have an STD screen today.  Past medical history,surgical history, family history and social history were all reviewed and documented in the EPIC chart.  Gynecologic History No LMP recorded. Patient has had a hysterectomy. Contraception: status post hysterectomy Last Pap: 2011. Results were: normal Last mammogram: 2014. Results were: normal  Obstetric History OB History  Gravida Para Term Preterm AB SAB TAB Ectopic Multiple Living  3 2 2  0 1 1    2     # Outcome Date GA Lbr Len/2nd Weight Sex Delivery Anes PTL Lv  3 SAB           2 TRM           1 TRM                ROS: A ROS was performed  and pertinent positives and negatives are included in the history.  GENERAL: No fevers or chills. HEENT: No change in vision, no earache, sore throat or sinus congestion. NECK: No pain or stiffness. CARDIOVASCULAR: No chest pain or pressure. No palpitations. PULMONARY: No shortness of breath, cough or wheeze. GASTROINTESTINAL: No abdominal pain, nausea, vomiting or diarrhea, melena or bright red blood per rectum. GENITOURINARY: No urinary frequency, urgency, hesitancy or dysuria. MUSCULOSKELETAL: No joint or muscle pain, no back pain, no recent trauma. DERMATOLOGIC: No rash, no itching, no lesions. ENDOCRINE: No polyuria, polydipsia, no heat or cold intolerance. No recent change in weight. HEMATOLOGICAL: No anemia or easy bruising or bleeding. NEUROLOGIC: No headache, seizures, numbness, tingling or weakness. PSYCHIATRIC: No depression, no loss of interest in normal activity or change in sleep pattern.     Exam: chaperone present  BP 128/86  Ht 5' 5.25" (1.657 m)  Wt 194 lb 8 oz (88.225 kg)  BMI 32.13 kg/m2  Body mass index is 32.13 kg/(m^2).  General appearance : Well developed well nourished female. No acute distress HEENT: Neck supple, trachea midline, no carotid bruits, no thyroidmegaly Lungs: Clear to auscultation, no rhonchi or wheezes, or rib retractions  Heart: Regular rate and rhythm, no murmurs or gallops Breast:Examined in sitting and supine position were symmetrical in appearance, no palpable masses or tenderness,  no skin retraction, no nipple inversion, no nipple discharge, no skin discoloration, no axillary  or supraclavicular lymphadenopathy Abdomen: no palpable masses or tenderness, no rebound or guarding Extremities: no edema or skin discoloration or tenderness  Pelvic:  Bartholin, Urethra, Skene Glands: Within normal limits             Vagina: No gross lesions or discharge  Cervix: absent Uterus absent Adnexa  Without masses or tenderness  Anus and perineum  normal    Rectovaginal  normal sphincter tone without palpated masses or tenderness             Hemoccult cards provided     Assessment/Plan:  47 y.o. female for annual exam with perimenopausal symptoms consisting of hot flashes, irritability, and mood swings. Patient with history of Addison's disease. Patient requesting STD screening. Pap smear along with GC and chlamydia culture was obtained. Patient will have an FSH drawn today along with HIV, RPR, hepatitis B and C. Screens as well. Patient will be scheduled to undergo a bone density study here in our office in next 2 weeks since she is on chronic steroid. We discussed importance of calcium and vitamin D twice a day for osteoporosis prevention as well as regular exercise. Literature and information on the perimenopause, hormone replacement therapy and bone density was provided today.  Note: This dictation was prepared with  Dragon/digital dictation along withSmart phrase technology. Any transcriptional errors that result from this process are unintentional.   Ok Edwards MD, 3:13 PM 07/24/2013

## 2013-07-25 LAB — GC/CHLAMYDIA PROBE AMP
CT Probe RNA: NEGATIVE
GC Probe RNA: NEGATIVE

## 2013-07-25 LAB — HEPATITIS B SURFACE ANTIGEN: Hepatitis B Surface Ag: NEGATIVE

## 2013-07-25 LAB — FOLLICLE STIMULATING HORMONE: FSH: 19.2 m[IU]/mL

## 2013-07-25 LAB — HIV ANTIBODY (ROUTINE TESTING W REFLEX): HIV: NONREACTIVE

## 2013-07-25 LAB — HEPATITIS C ANTIBODY: HCV Ab: NEGATIVE

## 2013-07-26 DIAGNOSIS — M5412 Radiculopathy, cervical region: Secondary | ICD-10-CM | POA: Diagnosis not present

## 2013-07-26 DIAGNOSIS — R630 Anorexia: Secondary | ICD-10-CM | POA: Diagnosis not present

## 2013-07-26 DIAGNOSIS — R7309 Other abnormal glucose: Secondary | ICD-10-CM | POA: Diagnosis not present

## 2013-07-26 DIAGNOSIS — G8929 Other chronic pain: Secondary | ICD-10-CM | POA: Diagnosis not present

## 2013-07-26 DIAGNOSIS — E2749 Other adrenocortical insufficiency: Secondary | ICD-10-CM | POA: Diagnosis not present

## 2013-07-26 DIAGNOSIS — E039 Hypothyroidism, unspecified: Secondary | ICD-10-CM | POA: Diagnosis not present

## 2013-07-26 DIAGNOSIS — I10 Essential (primary) hypertension: Secondary | ICD-10-CM | POA: Diagnosis not present

## 2013-07-26 DIAGNOSIS — S069X9S Unspecified intracranial injury with loss of consciousness of unspecified duration, sequela: Secondary | ICD-10-CM | POA: Diagnosis not present

## 2013-07-27 ENCOUNTER — Telehealth: Payer: Self-pay | Admitting: *Deleted

## 2013-07-27 NOTE — Telephone Encounter (Signed)
Pt informed with lab results on 07/24/13

## 2013-07-30 ENCOUNTER — Encounter (HOSPITAL_COMMUNITY): Payer: Self-pay | Admitting: Emergency Medicine

## 2013-07-30 ENCOUNTER — Emergency Department (HOSPITAL_COMMUNITY)
Admission: EM | Admit: 2013-07-30 | Discharge: 2013-07-30 | Disposition: A | Discharge status: Home / Self Care | Payer: Medicare Other | Attending: Emergency Medicine | Admitting: Emergency Medicine

## 2013-07-30 ENCOUNTER — Emergency Department (HOSPITAL_COMMUNITY): Payer: Medicare Other

## 2013-07-30 ENCOUNTER — Other Ambulatory Visit: Payer: Self-pay

## 2013-07-30 DIAGNOSIS — R0789 Other chest pain: Secondary | ICD-10-CM | POA: Diagnosis not present

## 2013-07-30 DIAGNOSIS — I10 Essential (primary) hypertension: Secondary | ICD-10-CM | POA: Diagnosis not present

## 2013-07-30 DIAGNOSIS — Z8674 Personal history of sudden cardiac arrest: Secondary | ICD-10-CM | POA: Diagnosis not present

## 2013-07-30 DIAGNOSIS — K3184 Gastroparesis: Secondary | ICD-10-CM | POA: Diagnosis not present

## 2013-07-30 DIAGNOSIS — E039 Hypothyroidism, unspecified: Secondary | ICD-10-CM | POA: Diagnosis not present

## 2013-07-30 DIAGNOSIS — Z87828 Personal history of other (healed) physical injury and trauma: Secondary | ICD-10-CM | POA: Insufficient documentation

## 2013-07-30 DIAGNOSIS — Z79899 Other long term (current) drug therapy: Secondary | ICD-10-CM | POA: Insufficient documentation

## 2013-07-30 DIAGNOSIS — R079 Chest pain, unspecified: Secondary | ICD-10-CM | POA: Diagnosis not present

## 2013-07-30 DIAGNOSIS — J441 Chronic obstructive pulmonary disease with (acute) exacerbation: Secondary | ICD-10-CM | POA: Diagnosis not present

## 2013-07-30 DIAGNOSIS — G8929 Other chronic pain: Secondary | ICD-10-CM | POA: Diagnosis not present

## 2013-07-30 DIAGNOSIS — M7989 Other specified soft tissue disorders: Secondary | ICD-10-CM | POA: Insufficient documentation

## 2013-07-30 DIAGNOSIS — F411 Generalized anxiety disorder: Secondary | ICD-10-CM | POA: Diagnosis not present

## 2013-07-30 DIAGNOSIS — I2510 Atherosclerotic heart disease of native coronary artery without angina pectoris: Secondary | ICD-10-CM | POA: Diagnosis not present

## 2013-07-30 DIAGNOSIS — Z8669 Personal history of other diseases of the nervous system and sense organs: Secondary | ICD-10-CM | POA: Diagnosis not present

## 2013-07-30 DIAGNOSIS — I509 Heart failure, unspecified: Secondary | ICD-10-CM | POA: Insufficient documentation

## 2013-07-30 DIAGNOSIS — Z95818 Presence of other cardiac implants and grafts: Secondary | ICD-10-CM | POA: Insufficient documentation

## 2013-07-30 DIAGNOSIS — M549 Dorsalgia, unspecified: Secondary | ICD-10-CM | POA: Diagnosis not present

## 2013-07-30 DIAGNOSIS — J449 Chronic obstructive pulmonary disease, unspecified: Secondary | ICD-10-CM

## 2013-07-30 DIAGNOSIS — IMO0002 Reserved for concepts with insufficient information to code with codable children: Secondary | ICD-10-CM | POA: Diagnosis not present

## 2013-07-30 DIAGNOSIS — Z7982 Long term (current) use of aspirin: Secondary | ICD-10-CM | POA: Insufficient documentation

## 2013-07-30 DIAGNOSIS — Z880 Allergy status to penicillin: Secondary | ICD-10-CM | POA: Insufficient documentation

## 2013-07-30 DIAGNOSIS — F17200 Nicotine dependence, unspecified, uncomplicated: Secondary | ICD-10-CM | POA: Insufficient documentation

## 2013-07-30 HISTORY — DX: Cardiac arrest, cause unspecified: I46.9

## 2013-07-30 HISTORY — DX: Tobacco use: Z72.0

## 2013-07-30 LAB — CBC WITH DIFFERENTIAL/PLATELET
Basophils Absolute: 0.1 10*3/uL (ref 0.0–0.1)
Basophils Relative: 0 % (ref 0–1)
Eosinophils Absolute: 0.2 10*3/uL (ref 0.0–0.7)
MCH: 30.3 pg (ref 26.0–34.0)
MCHC: 35.2 g/dL (ref 30.0–36.0)
MCV: 86.1 fL (ref 78.0–100.0)
Monocytes Relative: 6 % (ref 3–12)
Neutro Abs: 15.8 10*3/uL — ABNORMAL HIGH (ref 1.7–7.7)
Neutrophils Relative %: 79 % — ABNORMAL HIGH (ref 43–77)
Platelets: 452 10*3/uL — ABNORMAL HIGH (ref 150–400)
RDW: 15.2 % (ref 11.5–15.5)

## 2013-07-30 LAB — COMPREHENSIVE METABOLIC PANEL
ALT: 35 U/L (ref 0–35)
AST: 35 U/L (ref 0–37)
Albumin: 3.6 g/dL (ref 3.5–5.2)
Alkaline Phosphatase: 91 U/L (ref 39–117)
BUN: 11 mg/dL (ref 6–23)
GFR calc non Af Amer: >90 mL/min (ref >90)
Potassium: 3.6 mEq/L (ref 3.5–5.1)
Sodium: 135 mEq/L (ref 135–145)
Total Protein: 7.8 g/dL (ref 6.0–8.3)

## 2013-07-30 LAB — TROPONIN I: Troponin I: <0.3 ng/mL (ref <0.30)

## 2013-07-30 LAB — PRO B NATRIURETIC PEPTIDE: Pro B Natriuretic peptide (BNP): 52.8 pg/mL (ref 0–125)

## 2013-07-30 LAB — HEMOGLOBIN A1C: Hgb A1c MFr Bld: 5.4 % (ref <5.7)

## 2013-07-30 MED ORDER — NITROGLYCERIN 0.4 MG SL SUBL
0.4000 mg | SUBLINGUAL_TABLET | SUBLINGUAL | Status: DC | PRN
  Filled 2013-07-30: qty 25

## 2013-07-30 MED ORDER — ONDANSETRON HCL 4 MG/2ML IJ SOLN
4.0000 mg | Freq: Once | INTRAMUSCULAR | Status: AC
  Administered 2013-07-30: 4 mg via INTRAVENOUS
  Filled 2013-07-30: qty 2

## 2013-07-30 MED ORDER — ACETAMINOPHEN 325 MG PO TABS
650.0000 mg | ORAL_TABLET | Freq: Once | ORAL | Status: AC
  Administered 2013-07-30: 650 mg via ORAL
  Filled 2013-07-30: qty 2

## 2013-07-30 MED ORDER — IBUPROFEN 400 MG PO TABS: 600.0000 mg | ORAL_TABLET | Freq: Once | ORAL | Status: DC

## 2013-07-30 MED ORDER — KETOROLAC TROMETHAMINE 30 MG/ML IJ SOLN
30.0000 mg | Freq: Once | INTRAMUSCULAR | Status: AC
  Administered 2013-07-30: 30 mg via INTRAVENOUS
  Filled 2013-07-30: qty 1

## 2013-07-30 MED ORDER — ALBUTEROL SULFATE HFA 108 (90 BASE) MCG/ACT IN AERS
2.0000 | INHALATION_SPRAY | Freq: Once | RESPIRATORY_TRACT | Status: AC
  Administered 2013-07-30: 2 via RESPIRATORY_TRACT
  Filled 2013-07-30: qty 6.7

## 2013-07-30 MED ORDER — ALBUTEROL SULFATE (5 MG/ML) 0.5% IN NEBU
5.0000 mg | INHALATION_SOLUTION | Freq: Once | RESPIRATORY_TRACT | Status: AC
  Administered 2013-07-30: 5 mg via RESPIRATORY_TRACT
  Filled 2013-07-30: qty 1

## 2013-07-30 NOTE — ED Provider Notes (Signed)
CSN: [id number]     Arrival date & time 07/30/13  8413 History   First MD Initiated Contact with Patient 07/30/13 1006     Chief Complaint  Patient presents with  . Chest Pain   (Consider location/radiation/quality/duration/timing/severity/associated sxs/prior Treatment) HPI [name] is a 47 y.o. female who sensed emergency department complaining of chest pain and shortness of breath. States symptoms began 2 days ago. States feels like her legs are more swollen than usual. Reports pain started in the left jaw and left side of the neck, and now also in the left chest. Pain is worsened with exertion. She reports associated shortness of breath. No diaphoresis or dizziness. States that she does not have any history of coronary disease but reports history of CHF. States that she is compliant with all of her medications. Reports she is followed by [facility] cardiology. She denies any fever, chills, increased cough. No upper respiratory symptoms.  Past Medical History  Diagnosis Date  . Cardiomyopathy     resolved  . Chest pain     chronicc  . Hyperlipidemia   . HTN (hypertension)   . Hypothyroidism   . Adrenal insufficiency   . Anxiety   . Nondiabetic gastroparesis   . CAD (coronary artery disease)     Cath 2008 EF normal. RCA 50-60, Septal 50%. Myoview 3/12: EF 53% normal perfusion  . Chronic back pain   . History of CHF (congestive heart failure)   . QT prolongation   . Addison disease   . Mitral valve prolapse   . Gastroparesis   . Chronic diarrhea   . Astigmatism   . Concussion     sept 28th 2014  . CHF (congestive heart failure)   . Cardiac arrest    Past Surgical History  Procedure Laterality Date  . Cholecystectomy    . Spine surgery    . Vesicovaginal fistula closure w/ tah    . Varicose vein surgery    . Abdominal hysterectomy    . Cardiac catheterization  03/2007    showed 60% lesion in the right coronary artery   Family History  Problem Relation Age of Onset   . Coronary artery disease    . Heart attack Mother   . Cancer Father     Colon   History  Substance Use Topics  . Smoking status: Current Every Day Smoker -- 0.50 packs/day for 10 years    Types: Cigarettes  . Smokeless tobacco: Never Used     Comment: smokes 6-7 cigarettes per day  . Alcohol Use: No   OB History   Grav Para Term Preterm Abortions TAB SAB Ect Mult Living   3 2 2  0 1  1   2      Review of Systems  Constitutional: Negative for fever and chills.  Respiratory: Positive for chest tightness and shortness of breath. Negative for cough.   Cardiovascular: Positive for chest pain and leg swelling. Negative for palpitations.  Gastrointestinal: Negative for nausea, vomiting, abdominal pain and diarrhea.  Genitourinary: Negative for dysuria and flank pain.  Musculoskeletal: Negative for arthralgias, myalgias, neck pain and neck stiffness.  Skin: Negative for rash.  Neurological: Negative for dizziness, weakness and headaches.  All other systems reviewed and are negative.    Allergies  Bee venom; Penicillins; Cephalexin; Doxycycline; Dust mite extract; Erythromycin; Morphine and related; Sulfonamide derivatives; and Tramadol  Home Medications   Current Outpatient Rx  Name  Route  Sig  Dispense  Refill  . amLODipine (  NORVASC) 10 MG tablet   Oral   Take 10 mg by mouth daily.         Marland Kitchen aspirin 81 MG chewable tablet   Oral   Chew 81 mg by mouth daily. Daily dose is 1 tab, takes 4 tabs if feeling chest pain or pressure         . chlorpheniramine-HYDROcodone (TUSSIONEX) 10-8 MG/5ML LQCR   Oral   Take 5 mLs by mouth every 12 (twelve) hours as needed (for cough).         . cyclobenzaprine (FLEXERIL) 10 MG tablet   Oral   Take 10 mg by mouth as needed for muscle spasms.         . hydrocortisone (CORTEF) 10 MG tablet   Oral   Take 10-20 mg by mouth 2 (two) times daily. Pt takes 20 mg in the morning and 10 mg at night         . levothyroxine (SYNTHROID,  LEVOTHROID) 75 MCG tablet   Oral   Take 75 mcg by mouth daily before breakfast.         . LORazepam (ATIVAN) 1 MG tablet   Oral   Take 1 mg by mouth every 4 (four) hours. For anxiety Scheduled dose         . losartan (COZAAR) 100 MG tablet   Oral   Take 100 mg by mouth daily.         . meclizine (ANTIVERT) 25 MG tablet   Oral   Take 1 tablet (25 mg total) by mouth 4 (four) times daily as needed for dizziness.   60 tablet   0   . metoCLOPramide (REGLAN) 10 MG tablet   Oral   Take 20 mg by mouth 3 (three) times daily as needed. Runny stools         . mirtazapine (REMERON) 15 MG tablet   Oral   Take 15 mg by mouth at bedtime.         Marland Kitchen EXPIRED: nitroGLYCERIN (NITROSTAT) 0.4 MG SL tablet   Sublingual   Place 1 tablet (0.4 mg total) under the tongue every 5 (five) minutes as needed for chest pain.   25 tablet   3   . ondansetron (ZOFRAN) 4 MG tablet   Oral   Take 4-8 mg by mouth every 4 (four) hours as needed. For nausea         . oxycodone (ROXICODONE) 30 MG immediate release tablet   Oral   Take 30 mg by mouth every 6 (six) hours as needed. For pain          BP 130/87  Pulse 96  Temp(Src) 98.4 F (36.9 C) (Oral)  Resp 20  SpO2 95% Physical Exam  Nursing note and vitals reviewed. Constitutional: She is oriented to person, place, and time. She appears well-developed and well-nourished. No distress.  HENT:  Head: Normocephalic.  Eyes: Conjunctivae are normal.  Neck: Neck supple.  Cardiovascular: Normal rate, regular rhythm and normal heart sounds.   Pulmonary/Chest: Effort normal and breath sounds normal. No respiratory distress. She has no wheezes. She has no rales. She exhibits no tenderness.  Abdominal: Soft. Bowel sounds are normal. She exhibits no distension. There is no tenderness. There is no rebound.  Musculoskeletal: She exhibits no edema.  Trace lower extremity edema  Neurological: She is alert and oriented to person, place, and time.   Skin: Skin is warm and dry.  Psychiatric: She has a normal mood and affect. Her  behavior is normal.    ED Course  Procedures (including critical care time) Labs Review  Imaging Review Dg Chest 2 View  07/30/2013   CLINICAL DATA:  Left chest pain, cough  EXAM: CHEST  2 VIEW  COMPARISON:  06/15/2013  FINDINGS: The heart size and mediastinal contours are within normal limits. Both lungs are clear. The visualized skeletal structures are unremarkable. Prior cholecystectomy noted. Lower cervical fusion hardware present.  IMPRESSION: No active cardiopulmonary disease.  Stable exam.   Electronically Signed   By: Ruel Favors M.D.   On: 07/30/2013 10:53    EKG Interpretation   None       MDM   1. Chest pain   2. COPD (chronic obstructive pulmonary disease)     Pt with chest pain onset 2 days ago. Chest pain is exertional radiating into the left jaw and ear and neck. Currently patient is chest pain-free. She received aspirin prior to coming in. I ordered nitroglycerin sublingual but she was not given and because she had no pain. Labs and x-rays pending  12:30 PM Delay in troponin. Did not receive point-of-care blood in mini lab  12:45 PM  Spoke with cardiology, will come see pt in ED.   2:47 PM Spoke with cardiology. Cleared from cardiac stand point. At this time, pt is stable for d/c home. I do not think this is a PE, her VS are normal. She is CP free. Her WBC elevated, she is  Afebrile, CXR negative.  Her hgb and platelets are elevated as well, could be due to hemoconcentration, or possibly due to being on steroids. She did have mild wheezing on initial exam. Will treat for COPD. Instructed to quit smoking. She will increase her hydrocortisone for few days until feeling better.    Filed Vitals:   07/30/13 1030 07/30/13 1145 07/30/13 1345 07/30/13 1515  BP: 119/75 111/65 121/66 103/58  Pulse: 85 83 83 89  Temp:      TempSrc:      Resp: 15 14 12    SpO2: 94% 92% 98% 93%      Lottie Mussel, PA-C 07/30/13 1609

## 2013-07-30 NOTE — ED Notes (Signed)
Pt c/o head ache , pt had Ibuprofen ordered but refused it,

## 2013-07-30 NOTE — Consult Note (Signed)
[name] MRN: [id number], DOB/AGE: [birth year]  Admit date: 07/30/2013  Primary Physician: Cassell Smiles., MD Primary Cardiologist: Dr. Gala Romney   Pt. Profile: [name] is a 47 y.o. female with a history of HTN, HLD, with a chest pain history without CAD,  Addison's disease, hypothyroidism, prolonged QT, NICM with prior EF 40-45% (EF subsequently improved), hyperglycemia, tobacco abuse and gastroparesis who is being admitted today for chest pain.  Patient first noticed left-sided jaw discomfort on Saturday morning. This progressed into left chest, shoulder and arm pain on Sunday and worsened on Monday morning. She called her PCP who advised her to go to the emergency department. She rates the pain over the weekend as a 10/10 at its worst and describes it as a "pressure that explodes and goes into different parts of her chest." She admits to associated shortness of breath, diaphoresis, and nausea, as well as, intermittent palpitations, dizziness, lightheadedness, headache and syncope while laying on the couch. She reports general malaise and lethargy in the past 2 weeks with increasing shortness of breath with usual activity. She has had no fevers but reports chills. Currently the patient is an 6/10 chest pain.  [name] was hospitalized for chest pain in 02/2012 and MI was ruled out with enzymes. She was seen again on two other separate occasions in 03/2012 for chest pain. Patient reported similar symptoms to those today during these episodes. When asked if the symptoms were the same as last year, she replied, yes- but this time it was worse. Patient was last seen by Tereso Newcomer PA-C in 04/2012 which was followed by a cardiac cath. Patient had a cardiac cath in 2008 that was read as 50-60% segmental stenosis in RCA. This was followed by another cath in 2013 that showed no obstruction favoring spasm in the previous abnormal study. LVEF was estimated at 55-65% at that  time.  Patient is in no acute distress currently. She reports 6/10 CP and a headache. Her first troponin is negative and EKG with no acute ST or T wave changes.  Problem List  Past Medical History  Diagnosis Date  . Cardiomyopathy     resolved  . Chest pain     chronicc  . Hyperlipidemia   . HTN (hypertension)   . Hypothyroidism   . Adrenal insufficiency     diagnosed 2012  . Anxiety   . Nondiabetic gastroparesis   . CAD (coronary artery disease)     Cath 2008 EF normal. RCA 50-60, Septal 50%. Myoview 3/12: EF 53% normal perfusion  . Chronic back pain   . QT prolongation   . Mitral valve prolapse   . Gastroparesis   . Chronic diarrhea   . Astigmatism   . Concussion     sept 28th 2014  . CHF (congestive heart failure)   . Cardiac arrest     2/2 adissonian crisis  . Tobacco abuse     down to 2 cigarettes per day    Past Surgical History  Procedure Laterality Date  . Cholecystectomy    . Spine surgery    . Vesicovaginal fistula closure w/ tah    . Varicose vein surgery    . Abdominal hysterectomy    . Cardiac catheterization  03/2007    showed 60% lesion in the right coronary artery     Allergies  Allergies  Allergen Reactions  . Bee Venom Anaphylaxis  . Penicillins Anaphylaxis    Causes anaphylaxis  . Cephalexin Diarrhea  .  Doxycycline Other (See Comments)    Due to Pre-Existing conditions involved with stomach, patient does not take the following medication  . Dust Mite Extract Hives and Swelling  . Erythromycin Other (See Comments)    Due to Pre-Existing conditions involved with stomach, patient does not take the following medication  . Morphine And Related Itching  . Sulfonamide Derivatives Itching    Causes itching   . Tramadol Nausea Only     Home Medications  Prior to Admission medications   Medication Sig Start Date End Date Taking? Authorizing Provider  amLODipine (NORVASC) 10 MG tablet Take 10 mg by mouth daily.   Yes Historical Provider,  MD  aspirin 81 MG chewable tablet Chew 81 mg by mouth daily. Daily dose is 1 tab, takes 4 tabs if feeling chest pain or pressure   Yes Historical Provider, MD  cyclobenzaprine (FLEXERIL) 10 MG tablet Take 10 mg by mouth as needed for muscle spasms.   Yes Historical Provider, MD  hydrocortisone (CORTEF) 10 MG tablet Take 10-20 mg by mouth 2 (two) times daily. Pt takes 20 mg in the morning and 10 mg at night   Yes Historical Provider, MD  levothyroxine (SYNTHROID, LEVOTHROID) 75 MCG tablet Take 75 mcg by mouth daily before breakfast.   Yes Historical Provider, MD  LORazepam (ATIVAN) 2 MG tablet Take 2 mg by mouth every 6 (six) hours as needed for anxiety.   Yes Historical Provider, MD  losartan (COZAAR) 100 MG tablet Take 100 mg by mouth daily.   Yes Historical Provider, MD  meclizine (ANTIVERT) 25 MG tablet Take 1 tablet (25 mg total) by mouth 4 (four) times daily as needed for dizziness. 06/15/13  Yes Ward Givens, MD  metoCLOPramide (REGLAN) 10 MG tablet Take 20 mg by mouth 3 (three) times daily as needed. Runny stools   Yes Historical Provider, MD  mirtazapine (REMERON) 15 MG tablet Take 15 mg by mouth at bedtime.   Yes Historical Provider, MD  nitroGLYCERIN (NITROSTAT) 0.4 MG SL tablet Place 1 tablet (0.4 mg total) under the tongue every 5 (five) minutes as needed for chest pain. 04/11/12 07/30/13 Yes Scott T Weaver, PA-C  ondansetron (ZOFRAN) 4 MG tablet Take 4-8 mg by mouth every 4 (four) hours as needed. For nausea   Yes Historical Provider, MD  oxycodone (ROXICODONE) 30 MG immediate release tablet Take 30 mg by mouth every 6 (six) hours as needed. For pain   Yes Historical Provider, MD    Family History  Family History  Problem Relation Age of Onset  . Coronary artery disease    . Heart attack Mother   . Cancer Father     Colon    Social History  History   Social History  . Marital Status: Divorced    Spouse Name: N/A    Number of Children: N/A  . Years of Education: N/A    Occupational History  . Not on file.   Social History Main Topics  . Smoking status: Current Every Day Smoker -- 0.50 packs/day for 10 years    Types: Cigarettes  . Smokeless tobacco: Never Used     Comment: smokes 6-7 cigarettes per day  . Alcohol Use: No  . Drug Use: No  . Sexual Activity: No   Other Topics Concern  . Not on file   Social History Narrative   Lives in [place] with daughter and son. She is on disability.      All other systems reviewed and are otherwise  negative except as noted above.  Physical Exam  Blood pressure 103/58, pulse 89, temperature 98.4 F (36.9 C), temperature source Oral, resp. rate 12, SpO2 93.00%.  General: Pleasant, NAD Psych: Normal affect. Neuro: Alert and oriented X 3. Moves all extremities spontaneously. HEENT: Normal  Neck: Supple without bruits or JVD. Lungs:  Resp regular and unlabored, CTA.+ rhonchi Heart: RRR no s3, s4, or murmurs. +tenderness to palpation over precordium Abdomen: Soft, non-tender, non-distended, BS + x 4.  Extremities: No clubbing, cyanosis or + trace edema in LE. DP/PT/Radials 2+ and equal bilaterally. Labs   Recent Labs  07/30/13 1025  TROPONINI <0.30   Lab Results  Component Value Date   WBC 20.1* 07/30/2013   HGB 15.4* 07/30/2013   HCT 43.8 07/30/2013   MCV 86.1 07/30/2013   PLT 452* 07/30/2013     Recent Labs Lab 07/30/13 1025  NA 135  K 3.6  CL 100  CO2 21  BUN 11  CREATININE 0.72  CALCIUM 9.3  PROT 7.8  BILITOT 0.4  ALKPHOS 91  ALT 35  AST 35  GLUCOSE 110*     Radiology/Studies  Dg Chest 2 View  07/30/2013   CLINICAL DATA:  Left chest pain, cough  EXAM: CHEST  2 VIEW  COMPARISON:  06/15/2013  FINDINGS: The heart size and mediastinal contours are within normal limits. Both lungs are clear. The visualized skeletal structures are unremarkable. Prior cholecystectomy noted. Lower cervical fusion hardware present.  IMPRESSION: No active cardiopulmonary disease.  Stable exam.    Electronically Signed   By: Ruel Favors M.D.   On: 07/30/2013 10:53    ECG: 07/30/13 Normal sinus rhythm Nonspecific T wave abnormality Prolonged QT  ASSESSMENT AND PLAN  [name] is a 47 y.o. female with a history of HTN, HLD, with a chest pain history without CAD,  Addison's disease, hypothyroidism, prolonged QT, NICM with prior EF 40-45% (EF subsequently improved), hyperglycemia, tobacco abuse and gastroparesis who is being admitted today for chest pain.   1. Chest pain: Patient has a long history of chest pain and a clear cardiac cath in 04/2012 by Dr. Tedra Senegal. The symptoms she reported are very similar to her previous non-anginal chest pain.  - troponin negative - EKG with no acute ST or T wave changes - CXR shows no cardiopulmonary disease - we do not feel like the patient's chest pain is cardiac in origin and do not think she needs to be admitted to cardiology service. Admission or discharge per internal medicine.  2. HTN: continue losartan and amlodipine  3. Tobacco abuse: patient trying to quit and down to 2 cigarettes a day. Encouraged complete cessation   4. HLD: statin intolerant per Tereso Newcomer PA-C's 04/2012 note  5. Hypothyroidism: continue levothyroxine per IM  6. Addisons: continue hydrocortisone per IM  Signed, Thereasa Parkin, PA-C 07/30/2013, 3:22 PM Patient seen and examined. I agree with the assessment and plan as detailed above. See also my additional thoughts below.   I have carefully reviewed all the information and discussed it with Belenda Cruise, PA-C. In the past cardiac catheterization question the possibility of some narrowing in the right coronary artery. However catheterization later revealed no significant abnormalities. The patient has had discomfort for 3 days. Her troponin is normal. Her current symptoms are not consistent with ischemia. EKG reveals no significant change. At this time I feel that her current symptoms are not cardiac in origin.  Further cardiac workup is not recommended at this time.  Tinnie Gens  Myrtis Ser, MD, [facility] 07/30/2013 3:48 PM

## 2013-07-30 NOTE — ED Notes (Signed)
Pt has history of chf.  Pt is here with left jaw, ear, chest, and left shoulder pain since Saturday.  Pt reports headache and weight gain.

## 2013-07-30 NOTE — ED Notes (Signed)
Spoke with lab regarding troponin states that they "will pull it now"

## 2013-07-30 NOTE — H&P (Deleted)
note opened in error

## 2013-07-30 NOTE — ED Notes (Signed)
PA at bedside.

## 2013-07-31 NOTE — ED Provider Notes (Signed)
  Medical screening examination/treatment/procedure(s) were performed by non-physician practitioner and as supervising physician I was immediately available for consultation/collaboration.  EKG Interpretation    Date/Time:  Monday July 30 2013 09:52:43 EST Ventricular Rate:  93 PR Interval:  118 QRS Duration: 90 QT Interval:  372 QTC Calculation: 462 R Axis:   79 Text Interpretation:  Normal sinus rhythm Nonspecific T wave abnormality , new since last tracing Prolonged QT Abnormal ECG Abnormal ekg Confirmed by Gerhard Munch  MD ([id number]) on 07/31/2013 7:33:19 AM              Gerhard Munch, MD 07/31/13 [phone]

## 2013-08-05 ENCOUNTER — Emergency Department (HOSPITAL_COMMUNITY): Payer: Medicare Other

## 2013-08-05 ENCOUNTER — Encounter (HOSPITAL_COMMUNITY): Payer: Self-pay | Admitting: Emergency Medicine

## 2013-08-05 ENCOUNTER — Emergency Department (HOSPITAL_COMMUNITY)
Admission: EM | Admit: 2013-08-05 | Discharge: 2013-08-05 | Disposition: A | Discharge status: Home / Self Care | Payer: Medicare Other | Attending: Emergency Medicine | Admitting: Emergency Medicine

## 2013-08-05 DIAGNOSIS — Z8674 Personal history of sudden cardiac arrest: Secondary | ICD-10-CM | POA: Diagnosis not present

## 2013-08-05 DIAGNOSIS — Z87828 Personal history of other (healed) physical injury and trauma: Secondary | ICD-10-CM | POA: Diagnosis not present

## 2013-08-05 DIAGNOSIS — Z8669 Personal history of other diseases of the nervous system and sense organs: Secondary | ICD-10-CM | POA: Insufficient documentation

## 2013-08-05 DIAGNOSIS — E039 Hypothyroidism, unspecified: Secondary | ICD-10-CM | POA: Insufficient documentation

## 2013-08-05 DIAGNOSIS — Z8719 Personal history of other diseases of the digestive system: Secondary | ICD-10-CM | POA: Diagnosis not present

## 2013-08-05 DIAGNOSIS — I10 Essential (primary) hypertension: Secondary | ICD-10-CM | POA: Insufficient documentation

## 2013-08-05 DIAGNOSIS — Z79899 Other long term (current) drug therapy: Secondary | ICD-10-CM | POA: Diagnosis not present

## 2013-08-05 DIAGNOSIS — R079 Chest pain, unspecified: Secondary | ICD-10-CM | POA: Insufficient documentation

## 2013-08-05 DIAGNOSIS — R110 Nausea: Secondary | ICD-10-CM | POA: Insufficient documentation

## 2013-08-05 DIAGNOSIS — I509 Heart failure, unspecified: Secondary | ICD-10-CM | POA: Diagnosis not present

## 2013-08-05 DIAGNOSIS — R51 Headache: Secondary | ICD-10-CM | POA: Insufficient documentation

## 2013-08-05 DIAGNOSIS — I2510 Atherosclerotic heart disease of native coronary artery without angina pectoris: Secondary | ICD-10-CM | POA: Diagnosis not present

## 2013-08-05 DIAGNOSIS — R059 Cough, unspecified: Secondary | ICD-10-CM | POA: Insufficient documentation

## 2013-08-05 DIAGNOSIS — R05 Cough: Secondary | ICD-10-CM | POA: Insufficient documentation

## 2013-08-05 DIAGNOSIS — F17200 Nicotine dependence, unspecified, uncomplicated: Secondary | ICD-10-CM | POA: Diagnosis not present

## 2013-08-05 DIAGNOSIS — R0602 Shortness of breath: Secondary | ICD-10-CM | POA: Diagnosis not present

## 2013-08-05 DIAGNOSIS — Z880 Allergy status to penicillin: Secondary | ICD-10-CM | POA: Insufficient documentation

## 2013-08-05 DIAGNOSIS — M546 Pain in thoracic spine: Secondary | ICD-10-CM | POA: Diagnosis not present

## 2013-08-05 DIAGNOSIS — G8929 Other chronic pain: Secondary | ICD-10-CM | POA: Diagnosis not present

## 2013-08-05 DIAGNOSIS — Z7982 Long term (current) use of aspirin: Secondary | ICD-10-CM | POA: Insufficient documentation

## 2013-08-05 DIAGNOSIS — F411 Generalized anxiety disorder: Secondary | ICD-10-CM | POA: Diagnosis not present

## 2013-08-05 DIAGNOSIS — M542 Cervicalgia: Secondary | ICD-10-CM | POA: Insufficient documentation

## 2013-08-05 DIAGNOSIS — Z95818 Presence of other cardiac implants and grafts: Secondary | ICD-10-CM | POA: Insufficient documentation

## 2013-08-05 LAB — CBC WITH DIFFERENTIAL/PLATELET
Basophils Absolute: 0.1 10*3/uL (ref 0.0–0.1)
Basophils Relative: 1 % (ref 0–1)
Eosinophils Absolute: 0.2 10*3/uL (ref 0.0–0.7)
Hemoglobin: 14 g/dL (ref 12.0–15.0)
MCH: 29.4 pg (ref 26.0–34.0)
MCHC: 34.3 g/dL (ref 30.0–36.0)
Monocytes Relative: 6 % (ref 3–12)
Neutro Abs: 7 10*3/uL (ref 1.7–7.7)
Neutrophils Relative %: 59 % (ref 43–77)
RDW: 14.8 % (ref 11.5–15.5)
WBC: 11.8 10*3/uL — ABNORMAL HIGH (ref 4.0–10.5)

## 2013-08-05 LAB — COMPREHENSIVE METABOLIC PANEL
AST: 13 U/L (ref 0–37)
Albumin: 3.3 g/dL — ABNORMAL LOW (ref 3.5–5.2)
Alkaline Phosphatase: 87 U/L (ref 39–117)
BUN: 10 mg/dL (ref 6–23)
Chloride: 101 mEq/L (ref 96–112)
Creatinine, Ser: 0.79 mg/dL (ref 0.50–1.10)
Potassium: 4.4 mEq/L (ref 3.5–5.1)
Sodium: 137 mEq/L (ref 135–145)
Total Bilirubin: 0.3 mg/dL (ref 0.3–1.2)
Total Protein: 7 g/dL (ref 6.0–8.3)

## 2013-08-05 LAB — TROPONIN I: Troponin I: <0.3 ng/mL (ref <0.30)

## 2013-08-05 LAB — PRO B NATRIURETIC PEPTIDE: Pro B Natriuretic peptide (BNP): 129.5 pg/mL — ABNORMAL HIGH (ref 0–125)

## 2013-08-05 MED ORDER — OXYCODONE-ACETAMINOPHEN 5-325 MG PO TABS
2.0000 | ORAL_TABLET | Freq: Once | ORAL | Status: AC
  Administered 2013-08-05: 2 via ORAL
  Filled 2013-08-05: qty 2

## 2013-08-05 MED ORDER — ONDANSETRON 4 MG PO TBDP
4.0000 mg | ORAL_TABLET | Freq: Once | ORAL | Status: AC
  Administered 2013-08-05: 4 mg via ORAL
  Filled 2013-08-05: qty 1

## 2013-08-05 NOTE — ED Notes (Signed)
Pt reports she was here on Monday for jaw pain, chest pain, headache, throat pain, back pain, nausea, fluid retention and was supposed to be admitted but states she refused an ibuprofen so they sent her home. States no one explained anything to her and she continues to feel bad.

## 2013-08-05 NOTE — ED Provider Notes (Signed)
CSN: [id number]     Arrival date & time 08/05/13  1229 History   First MD Initiated Contact with Patient 08/05/13 1402     Chief Complaint  Patient presents with  . Jaw Pain    HPI  [name] is a 47 y.o. female with a PMH of CHF, cardiac arrest, QT prolongation, cardiomyopathy, chronic chest pain, HLD, HTN, adrenal insufficienty, anxiety, CAD, chronic back pain, MVP, gastroparesis, chronic diarrhea, and hx of tobacco use who presents to the ED for evaluation of jaw pain.  History was provided by the patient.  Patient was seen in the emergency department on 07/30/13 for chest pain and shortness of breath. She was discharged after being seen by cardiology. She states she did not followup with anybody after discharge. She has had continued chest pain and jaw pain intermittently ever since discharge. She states that her chest pain was gone yesterday however returned today around 9 AM this morning. Her chest pain is located in the left side of her chest with radiation to her left shoulder and left jaw. Her chest pain is described as a pressure sensation. She also has a sharp stabbing pain in the middle of her back. She states that she gets these pains approximately twice every hour for 15 minutes in duration. She denies any chest pain currently. She also complains of shortness of breath, which is only with exertion.  She has had a mild nonproductive cough. No fevers, chills, rhinorrhea, or congestion.   She also complains of nausea with no vomiting, constipation, diarrhea, dysuria, vaginal bleeding, vaginal discharge. She also complains of a throbbing headache in her frontal and occipital region "like a migraine". No vision changes, loss of sensation, weakness, numbness or tingling. She states she's had similar headaches in the past.  She denies any recent travel or history of cancer. She denies any history of DVT/PE/clotting.   Past Medical History  Diagnosis Date  . Cardiomyopathy     resolved  .  Chest pain     chronicc  . Hyperlipidemia   . HTN (hypertension)   . Hypothyroidism   . Adrenal insufficiency     diagnosed 2012  . Anxiety   . Nondiabetic gastroparesis   . CAD (coronary artery disease)     Cath 2008 EF normal. RCA 50-60, Septal 50%. Myoview 3/12: EF 53% normal perfusion  . Chronic back pain   . QT prolongation   . Mitral valve prolapse   . Gastroparesis   . Chronic diarrhea   . Astigmatism   . Concussion     sept 28th 2014  . CHF (congestive heart failure)   . Cardiac arrest     2/2 adissonian crisis  . Tobacco abuse     down to 2 cigarettes per day   Past Surgical History  Procedure Laterality Date  . Cholecystectomy    . Spine surgery    . Vesicovaginal fistula closure w/ tah    . Varicose vein surgery    . Abdominal hysterectomy    . Cardiac catheterization  03/2007    showed 60% lesion in the right coronary artery   Family History  Problem Relation Age of Onset  . Coronary artery disease    . Heart attack Mother   . Cancer Father     Colon   History  Substance Use Topics  . Smoking status: Current Every Day Smoker -- 0.50 packs/day for 10 years    Types: Cigarettes  . Smokeless tobacco: Never Used  Comment: smokes 6-7 cigarettes per day  . Alcohol Use: No   OB History   Grav Para Term Preterm Abortions TAB SAB Ect Mult Living   3 2 2  0 1  1   2      Review of Systems  Constitutional: Negative for fever, chills, diaphoresis, activity change, appetite change and fatigue.  HENT: Negative for congestion, dental problem, ear pain, facial swelling, rhinorrhea, sore throat and trouble swallowing.   Eyes: Negative for photophobia, pain and visual disturbance.  Respiratory: Positive for cough and shortness of breath (with exertion - not at rest). Negative for chest tightness and wheezing.   Cardiovascular: Positive for chest pain. Negative for leg swelling.  Gastrointestinal: Positive for nausea. Negative for vomiting, abdominal pain,  diarrhea and constipation.  Genitourinary: Negative for dysuria and hematuria.  Musculoskeletal: Positive for back pain and neck pain. Negative for gait problem, joint swelling and myalgias.  Skin: Negative for color change.  Neurological: Positive for headaches. Negative for dizziness, syncope, facial asymmetry, weakness, light-headedness and numbness.    Allergies  Bee venom; Penicillins; Cephalexin; Doxycycline; Dust mite extract; Erythromycin; Morphine and related; Sulfonamide derivatives; and Tramadol  Home Medications   Current Outpatient Rx  Name  Route  Sig  Dispense  Refill  . amLODipine (NORVASC) 10 MG tablet   Oral   Take 10 mg by mouth daily.         Marland Kitchen aspirin 81 MG chewable tablet   Oral   Chew 81 mg by mouth daily. Daily dose is 1 tab, takes 4 tabs if feeling chest pain or pressure         . cyclobenzaprine (FLEXERIL) 10 MG tablet   Oral   Take 10 mg by mouth as needed for muscle spasms.         . hydrocortisone (CORTEF) 10 MG tablet   Oral   Take 10-20 mg by mouth 2 (two) times daily. Pt takes 20 mg in the morning and 10 mg at night         . levothyroxine (SYNTHROID, LEVOTHROID) 75 MCG tablet   Oral   Take 75 mcg by mouth daily before breakfast.         . LORazepam (ATIVAN) 2 MG tablet   Oral   Take 2 mg by mouth every 6 (six) hours as needed for anxiety.         Marland Kitchen losartan (COZAAR) 100 MG tablet   Oral   Take 100 mg by mouth daily.         . meclizine (ANTIVERT) 25 MG tablet   Oral   Take 1 tablet (25 mg total) by mouth 4 (four) times daily as needed for dizziness.   60 tablet   0   . metoCLOPramide (REGLAN) 10 MG tablet   Oral   Take 20 mg by mouth 3 (three) times daily as needed. Runny stools         . mirtazapine (REMERON) 15 MG tablet   Oral   Take 15 mg by mouth at bedtime.         . ondansetron (ZOFRAN) 4 MG tablet   Oral   Take 4-8 mg by mouth every 4 (four) hours as needed. For nausea         . oxycodone  (ROXICODONE) 30 MG immediate release tablet   Oral   Take 30 mg by mouth every 6 (six) hours as needed. For pain         . EXPIRED: nitroGLYCERIN (NITROSTAT)  0.4 MG SL tablet   Sublingual   Place 1 tablet (0.4 mg total) under the tongue every 5 (five) minutes as needed for chest pain.   25 tablet   3    BP 130/79  Pulse 80  Temp(Src) 98.1 F (36.7 C) (Oral)  Resp 12  Wt 193 lb 14.4 oz (87.952 kg)  SpO2 100%  Filed Vitals:   08/05/13 1615 08/05/13 1630 08/05/13 1645 08/05/13 1700  BP: 141/85 142/85 128/76 134/87  Pulse: 75 77 73 73  Temp:      TempSrc:      Resp: 12 16 11 12   Weight:      SpO2: 98% 97% 99% 97%    Physical Exam  Nursing note and vitals reviewed. Constitutional: She is oriented to person, place, and time. She appears well-developed and well-nourished. No distress.  HENT:  Head: Normocephalic and atraumatic.  Right Ear: External ear normal.  Left Ear: External ear normal.  Nose: Nose normal.  Mouth/Throat: Oropharynx is clear and moist. No oropharyngeal exudate.  No tenderness to the scalp throughout.  TM's gray and translucent bilaterally.  No palpable mandibular pain throughout.  Patient reports increased pain with gritting her teeth.  No trismus.    Eyes: Conjunctivae are normal. Pupils are equal, round, and reactive to light. Right eye exhibits no discharge. Left eye exhibits no discharge.  Neck: Normal range of motion. Neck supple.  No cervical spinal or paraspinal tenderness.  No tenderness to the neck throughout.  No limitations with ROM.    Cardiovascular: Normal rate, regular rhythm, normal heart sounds and intact distal pulses.  Exam reveals no gallop and no friction rub.   No murmur heard. Dorsalis pedis pulses present and equal bilaterally  Pulmonary/Chest: Effort normal and breath sounds normal. No respiratory distress. She has no wheezes. She has no rales. She exhibits no tenderness.  Abdominal: Soft. Bowel sounds are normal. She exhibits no  distension and no mass. There is no tenderness. There is no rebound and no guarding.  Musculoskeletal: Normal range of motion. She exhibits no edema and no tenderness.  No pedal edema or calf tenderness bilaterally.  No tenderness to the thoracic or lumbar spine.  No tenderness to palpation to the shoulders or back throughout.  Patient moving all extremities throughout exam.  Patient able to ambulate without difficulty or ataxia.  Neurological: She is alert and oriented to person, place, and time.  GCS 15.  No focal neurological deficits.  CN 2-12 intact.   Skin: Skin is warm and dry. She is not diaphoretic.    ED Course  Procedures (including critical care time) Labs Review Labs Reviewed  PRO B NATRIURETIC PEPTIDE  CBC WITH DIFFERENTIAL  COMPREHENSIVE METABOLIC PANEL  TROPONIN I   Imaging Review No results found.  EKG Interpretation    Date/Time:  Sunday August 05 2013 14:16:50 EST Ventricular Rate:  75 PR Interval:  131 QRS Duration: 99 QT Interval:  402 QTC Calculation: 449 R Axis:   83 Text Interpretation:  Sinus rhythm Baseline wander in lead(s) V5 No significant change since last tracing Confirmed by Anitra Lauth  MD, WHITNEY ([id number]) on 08/05/2013 2:47:55 PM           Results for orders placed during the hospital encounter of 08/05/13  PRO B NATRIURETIC PEPTIDE      Result Value Range   Pro B Natriuretic peptide (BNP) 129.5 (*) 0 - 125 pg/mL  CBC WITH DIFFERENTIAL      Result Value Range  WBC 11.8 (*) 4.0 - 10.5 K/uL   RBC 4.77  3.87 - 5.11 MIL/uL   Hemoglobin 14.0  12.0 - 15.0 g/dL   HCT 40.9  81.1 - 91.4 %   MCV 85.5  78.0 - 100.0 fL   MCH 29.4  26.0 - 34.0 pg   MCHC 34.3  30.0 - 36.0 g/dL   RDW 78.2  95.6 - 21.3 %   Platelets 397  150 - 400 K/uL   Neutrophils Relative % 59  43 - 77 %   Neutro Abs 7.0  1.7 - 7.7 K/uL   Lymphocytes Relative 33  12 - 46 %   Lymphs Abs 3.8  0.7 - 4.0 K/uL   Monocytes Relative 6  3 - 12 %   Monocytes Absolute 0.7  0.1 - 1.0  K/uL   Eosinophils Relative 2  0 - 5 %   Eosinophils Absolute 0.2  0.0 - 0.7 K/uL   Basophils Relative 1  0 - 1 %   Basophils Absolute 0.1  0.0 - 0.1 K/uL  COMPREHENSIVE METABOLIC PANEL      Result Value Range   Sodium 137  135 - 145 mEq/L   Potassium 4.4  3.5 - 5.1 mEq/L   Chloride 101  96 - 112 mEq/L   CO2 27  19 - 32 mEq/L   Glucose, Bld 92  70 - 99 mg/dL   BUN 10  6 - 23 mg/dL   Creatinine, Ser 0.86  0.50 - 1.10 mg/dL   Calcium 9.2  8.4 - 57.8 mg/dL   Total Protein 7.0  6.0 - 8.3 g/dL   Albumin 3.3 (*) 3.5 - 5.2 g/dL   AST 13  0 - 37 U/L   ALT 11  0 - 35 U/L   Alkaline Phosphatase 87  39 - 117 U/L   Total Bilirubin 0.3  0.3 - 1.2 mg/dL   GFR calc non Af Amer >90  >90 mL/min   GFR calc Af Amer >90  >90 mL/min  TROPONIN I      Result Value Range   Troponin I <0.30  <0.30 ng/mL        DG Chest 2 View (Final result)  Result time: 08/05/13 15:29:53    Final result by Rad Results In Interface (08/05/13 15:29:53)    Narrative:   CLINICAL DATA: Chest pain, nausea, headache.  EXAM: CHEST - 2 VIEW  COMPARISON: 07/30/2013  FINDINGS: The heart size and mediastinal contours are within normal limits. Both lungs are clear. The visualized skeletal structures are unremarkable. No effusion. Cervical fixation hardware partially seen. Vascular clips in the right upper abdomen.  IMPRESSION: No acute cardiopulmonary disease.   Electronically Signed By: Oley Balm M.D. On: 08/05/2013 15:29         MDM   [name] is a 47 y.o. female with a PMH of CHF, cardiac arrest, QT prolongation, cardiomyopathy, chronic chest pain, HLD, HTN, adrenal insufficienty, anxiety, CAD, chronic back pain, MVP, gastroparesis, chronic diarrhea, and hx of tobacco use who presents to the ED for evaluation of jaw pain.  Chest x-ray, troponin, BNP, CMP, CBC, and EKG ordered to evaluate for cardiac causes of jaw pain.     Rechecks  4:23 PM = Patient states "the only thing bothering me is my  headache and nausea."  No chest pain.  States "I burped earlier and it helped."  She also states that she clenches her jaw when she gets headaches, which may also be causing her jaw  pain.  Usually takes oxycodone every 6 hours daily.  Ordering oxycodone and zofran and will re-assess.  4:50 PM = Patient states she is feeling better and "it is starting to work."   4:55 PM = Patient asking for something to drink.  Nausea improving.   5:15 PM = Pain resolved.  Ready for discharge.    Consults  4:14 PM = Spoke with [place] with cardiology who reviewed her cardiac cath report and does not believe she requires any evaluation or management of her chest pain from a cardiac standpoint.  If further observation is needed patient may be admitted for observation.  No other orders or recommendations.     Patient evaluated in the ED for jaw pain as well as multiple complaints including chest pain, headache, nausea, and back pain.  Jaw pain may be musculoskeletal in nature.  Cardiac causes were evaluated.  Patient has a history of chronic chest pain.  She was evaluated by cardiology for chest pain in the ED a few days ago in the ED and was discharged.  Cardiology was contacted today and suggested OP follow-up.  Patient had no complaints of SOB throughout her ED visit.  Her Wells score is 0.  PERC negative.  No hypoxia or tachycardia.  Labs were unremarkable.  Troponin negative.  EKG negative for acute ischemic changes.  Chest x-ray negative for an acute cardiopulmonary process.  Her pain resolved throughout her ED visit.  Follow-up and return precautions were discussed.  Patient discharged and is obtaining a ride home from friend/family member in ED.  Patient in agreement with discharge and plan.     Discharge Medication List as of 08/05/2013  5:12 PM       Final impressions: 1. Chest pain   2. Nausea   3. Headache       Luiz Iron PA-C   This patient was discussed with Dr. Vickey Sages, PA-C 08/07/13 [phone]

## 2013-08-06 ENCOUNTER — Encounter: Payer: Self-pay | Admitting: Gynecology

## 2013-08-08 NOTE — ED Provider Notes (Signed)
Medical screening examination/treatment/procedure(s) were performed by non-physician practitioner and as supervising physician I was immediately available for consultation/collaboration.  EKG Interpretation    Date/Time:  Sunday August 05 2013 14:16:50 EST Ventricular Rate:  75 PR Interval:  131 QRS Duration: 99 QT Interval:  402 QTC Calculation: 449 R Axis:   83 Text Interpretation:  Sinus rhythm Baseline wander in lead(s) V5 No significant change since last tracing Confirmed by Anitra Lauth  MD, WHITNEY ([id number]) on 08/05/2013 2:47:55 PM             Juliet Rude. Rubin Payor, MD 08/08/13 [phone]

## 2013-08-21 DIAGNOSIS — G8929 Other chronic pain: Secondary | ICD-10-CM | POA: Diagnosis not present

## 2013-08-21 DIAGNOSIS — Z6831 Body mass index (BMI) 31.0-31.9, adult: Secondary | ICD-10-CM | POA: Diagnosis not present

## 2013-08-23 ENCOUNTER — Emergency Department (HOSPITAL_COMMUNITY)
Admission: EM | Admit: 2013-08-23 | Discharge: 2013-08-24 | Disposition: A | Discharge status: Home / Self Care | Payer: Medicare Other | Attending: Emergency Medicine | Admitting: Emergency Medicine

## 2013-08-23 ENCOUNTER — Emergency Department (HOSPITAL_COMMUNITY): Payer: Medicare Other

## 2013-08-23 ENCOUNTER — Encounter (HOSPITAL_COMMUNITY): Payer: Self-pay | Admitting: Emergency Medicine

## 2013-08-23 DIAGNOSIS — Z880 Allergy status to penicillin: Secondary | ICD-10-CM | POA: Insufficient documentation

## 2013-08-23 DIAGNOSIS — R51 Headache: Secondary | ICD-10-CM | POA: Insufficient documentation

## 2013-08-23 DIAGNOSIS — Z8669 Personal history of other diseases of the nervous system and sense organs: Secondary | ICD-10-CM | POA: Insufficient documentation

## 2013-08-23 DIAGNOSIS — G8929 Other chronic pain: Secondary | ICD-10-CM | POA: Insufficient documentation

## 2013-08-23 DIAGNOSIS — Z95818 Presence of other cardiac implants and grafts: Secondary | ICD-10-CM | POA: Insufficient documentation

## 2013-08-23 DIAGNOSIS — I509 Heart failure, unspecified: Secondary | ICD-10-CM | POA: Diagnosis not present

## 2013-08-23 DIAGNOSIS — Z79899 Other long term (current) drug therapy: Secondary | ICD-10-CM | POA: Insufficient documentation

## 2013-08-23 DIAGNOSIS — Z8719 Personal history of other diseases of the digestive system: Secondary | ICD-10-CM | POA: Diagnosis not present

## 2013-08-23 DIAGNOSIS — R05 Cough: Secondary | ICD-10-CM | POA: Diagnosis not present

## 2013-08-23 DIAGNOSIS — Z7982 Long term (current) use of aspirin: Secondary | ICD-10-CM | POA: Diagnosis not present

## 2013-08-23 DIAGNOSIS — M2669 Other specified disorders of temporomandibular joint: Secondary | ICD-10-CM | POA: Diagnosis not present

## 2013-08-23 DIAGNOSIS — Z9089 Acquired absence of other organs: Secondary | ICD-10-CM | POA: Insufficient documentation

## 2013-08-23 DIAGNOSIS — I10 Essential (primary) hypertension: Secondary | ICD-10-CM | POA: Insufficient documentation

## 2013-08-23 DIAGNOSIS — R079 Chest pain, unspecified: Secondary | ICD-10-CM | POA: Insufficient documentation

## 2013-08-23 DIAGNOSIS — I2510 Atherosclerotic heart disease of native coronary artery without angina pectoris: Secondary | ICD-10-CM | POA: Diagnosis not present

## 2013-08-23 DIAGNOSIS — Z87828 Personal history of other (healed) physical injury and trauma: Secondary | ICD-10-CM | POA: Diagnosis not present

## 2013-08-23 DIAGNOSIS — IMO0002 Reserved for concepts with insufficient information to code with codable children: Secondary | ICD-10-CM | POA: Diagnosis not present

## 2013-08-23 DIAGNOSIS — F411 Generalized anxiety disorder: Secondary | ICD-10-CM | POA: Diagnosis not present

## 2013-08-23 DIAGNOSIS — F17200 Nicotine dependence, unspecified, uncomplicated: Secondary | ICD-10-CM | POA: Diagnosis not present

## 2013-08-23 DIAGNOSIS — E039 Hypothyroidism, unspecified: Secondary | ICD-10-CM | POA: Insufficient documentation

## 2013-08-23 DIAGNOSIS — Z90710 Acquired absence of both cervix and uterus: Secondary | ICD-10-CM | POA: Diagnosis not present

## 2013-08-23 DIAGNOSIS — R6884 Jaw pain: Secondary | ICD-10-CM | POA: Diagnosis not present

## 2013-08-23 DIAGNOSIS — R0602 Shortness of breath: Secondary | ICD-10-CM | POA: Diagnosis not present

## 2013-08-23 LAB — POCT I-STAT TROPONIN I

## 2013-08-23 LAB — CBC
HCT: 40.6 % (ref 36.0–46.0)
Hemoglobin: 13.9 g/dL (ref 12.0–15.0)
MCV: 87.9 fL (ref 78.0–100.0)
RBC: 4.62 MIL/uL (ref 3.87–5.11)
RDW: 14.8 % (ref 11.5–15.5)
WBC: 15.5 10*3/uL — ABNORMAL HIGH (ref 4.0–10.5)

## 2013-08-23 NOTE — ED Notes (Signed)
Pt. reports mid chest pain , left jaw pain and headache onset this morning , slight SOB , denies nausea or diaphoresis . Pt. took NTG sl with slight relief.

## 2013-08-24 ENCOUNTER — Emergency Department (HOSPITAL_COMMUNITY): Payer: Medicare Other

## 2013-08-24 DIAGNOSIS — M2669 Other specified disorders of temporomandibular joint: Secondary | ICD-10-CM | POA: Diagnosis not present

## 2013-08-24 LAB — BASIC METABOLIC PANEL
BUN: 11 mg/dL (ref 6–23)
CO2: 22 mEq/L (ref 19–32)
Chloride: 100 mEq/L (ref 96–112)
Creatinine, Ser: 0.7 mg/dL (ref 0.50–1.10)
GFR calc non Af Amer: >90 mL/min (ref >90)
Glucose, Bld: 94 mg/dL (ref 70–99)
Potassium: 3.7 mEq/L (ref 3.5–5.1)
Sodium: 135 mEq/L (ref 135–145)

## 2013-08-24 LAB — TROPONIN I: Troponin I: <0.3 ng/mL (ref <0.30)

## 2013-08-24 MED ORDER — METOCLOPRAMIDE HCL 5 MG/ML IJ SOLN
10.0000 mg | Freq: Once | INTRAMUSCULAR | Status: AC
  Administered 2013-08-24: 10 mg via INTRAVENOUS
  Filled 2013-08-24: qty 2

## 2013-08-24 MED ORDER — IOHEXOL 300 MG/ML  SOLN
75.0000 mL | Freq: Once | INTRAMUSCULAR | Status: AC | PRN
  Administered 2013-08-24: 75 mL via INTRAVENOUS

## 2013-08-24 MED ORDER — KETOROLAC TROMETHAMINE 30 MG/ML IJ SOLN
30.0000 mg | Freq: Once | INTRAMUSCULAR | Status: AC
  Administered 2013-08-24: 30 mg via INTRAVENOUS
  Filled 2013-08-24: qty 1

## 2013-08-24 NOTE — ED Provider Notes (Signed)
CT reassuring. Recent cardiac workup. Will d/c  Juliet Rude. Rubin Payor, MD 08/24/13 [phone]

## 2013-08-24 NOTE — ED Notes (Signed)
Pt states "I need my hydrocortisone to stay alive".  Pt states she took a dose yesterday morning and missed last night and this am dose.

## 2013-08-24 NOTE — ED Provider Notes (Signed)
CSN: [id number]     Arrival date & time 08/23/13  2246 History   First MD Initiated Contact with Patient 08/24/13 0147     Chief Complaint  Patient presents with  . Facial Pain  . Chest Pain   (Consider location/radiation/quality/duration/timing/severity/associated sxs/prior Treatment) HPI Comments: Pt comes in with cc of chest pain, jaw pain, facial pain. Pt has multiple medical co morbidities. She reports that the chest pain is left sided - resolved with nitro. She has had no known CAD, has Cardiomyopathy and CHF. She was seen for chest pain recently, and Cards reviewed her cath and cleared her. She reports no diaphoresis, nausea, dib. No hx of EP, DVT - and no risk factor for the same. Pt also has left sided jaw and facial pain - independent of her chest pain. They too started y'day. She in unable to move her jaw without pain. No dental pain. Pt has facial pain - and is sharp. No new rash. Pain is described as sharp, throbbing pain. No nausea, vomiting, visual complains, seizures, altered mental status, loss of consciousness, new weakness, or numbness, no gait instability.   Patient is a 47 y.o. female presenting with chest pain. The history is provided by the patient.  Chest Pain Associated symptoms: headache   Associated symptoms: no abdominal pain, no cough, no nausea, no shortness of breath and not vomiting     Past Medical History  Diagnosis Date  . Cardiomyopathy     resolved  . Chest pain     chronicc  . Hyperlipidemia   . HTN (hypertension)   . Hypothyroidism   . Adrenal insufficiency     diagnosed 2012  . Anxiety   . Nondiabetic gastroparesis   . CAD (coronary artery disease)     Cath 2008 EF normal. RCA 50-60, Septal 50%. Myoview 3/12: EF 53% normal perfusion  . Chronic back pain   . QT prolongation   . Mitral valve prolapse   . Gastroparesis   . Chronic diarrhea   . Astigmatism   . Concussion     sept 28th 2014  . CHF (congestive heart failure)   . Cardiac  arrest     2/2 adissonian crisis  . Tobacco abuse     down to 2 cigarettes per day   Past Surgical History  Procedure Laterality Date  . Cholecystectomy    . Spine surgery    . Vesicovaginal fistula closure w/ tah    . Varicose vein surgery    . Abdominal hysterectomy    . Cardiac catheterization  03/2007    showed 60% lesion in the right coronary artery   Family History  Problem Relation Age of Onset  . Coronary artery disease    . Heart attack Mother   . Cancer Father     Colon   History  Substance Use Topics  . Smoking status: Current Every Day Smoker -- 0.50 packs/day for 10 years    Types: Cigarettes  . Smokeless tobacco: Never Used     Comment: smokes 6-7 cigarettes per day  . Alcohol Use: No   OB History   Grav Para Term Preterm Abortions TAB SAB Ect Mult Living   3 2 2  0 1  1   2      Review of Systems  Constitutional: Negative for activity change.  HENT: Negative for facial swelling.   Respiratory: Negative for cough, shortness of breath and wheezing.   Cardiovascular: Positive for chest pain.  Gastrointestinal: Negative for nausea,  vomiting, abdominal pain, diarrhea, constipation, blood in stool and abdominal distention.  Genitourinary: Negative for hematuria and difficulty urinating.  Musculoskeletal: Negative for neck pain.  Skin: Negative for color change.  Neurological: Positive for headaches. Negative for speech difficulty.  Hematological: Does not bruise/bleed easily.  Psychiatric/Behavioral: Negative for confusion.    Allergies  Bee venom; Penicillins; Cephalexin; Doxycycline; Dust mite extract; Erythromycin; Ibuprofen; Morphine and related; Sulfonamide derivatives; and Tramadol  Home Medications   Current Outpatient Rx  Name  Route  Sig  Dispense  Refill  . amLODipine (NORVASC) 10 MG tablet   Oral   Take 10 mg by mouth daily.         Marland Kitchen aspirin 81 MG chewable tablet   Oral   Chew 81 mg by mouth daily. Daily dose is 1 tab, takes 4 tabs if  feeling chest pain or pressure         . cyclobenzaprine (FLEXERIL) 10 MG tablet   Oral   Take 10 mg by mouth as needed for muscle spasms.         . hydrocortisone (CORTEF) 10 MG tablet   Oral   Take 10-20 mg by mouth 2 (two) times daily. Pt takes 20 mg in the morning and 10 mg at night         . levothyroxine (SYNTHROID, LEVOTHROID) 75 MCG tablet   Oral   Take 75 mcg by mouth daily before breakfast.         . LORazepam (ATIVAN) 2 MG tablet   Oral   Take 2 mg by mouth every 6 (six) hours as needed for anxiety.         Marland Kitchen losartan (COZAAR) 100 MG tablet   Oral   Take 100 mg by mouth daily.         . meclizine (ANTIVERT) 25 MG tablet   Oral   Take 1 tablet (25 mg total) by mouth 4 (four) times daily as needed for dizziness.   60 tablet   0   . metoCLOPramide (REGLAN) 10 MG tablet   Oral   Take 20 mg by mouth 3 (three) times daily as needed. Runny stools         . mirtazapine (REMERON) 15 MG tablet   Oral   Take 15 mg by mouth at bedtime.         . nitroGLYCERIN (NITROSTAT) 0.4 MG SL tablet   Sublingual   Place 1 tablet (0.4 mg total) under the tongue every 5 (five) minutes as needed for chest pain.   25 tablet   3   . ondansetron (ZOFRAN) 4 MG tablet   Oral   Take 4-8 mg by mouth every 4 (four) hours as needed. For nausea         . oxycodone (ROXICODONE) 30 MG immediate release tablet   Oral   Take 30 mg by mouth every 6 (six) hours as needed. For pain          BP 120/66  Pulse 62  Temp(Src) 98.1 F (36.7 C) (Oral)  Resp 12  SpO2 93% Physical Exam  Nursing note and vitals reviewed. Constitutional: She is oriented to person, place, and time. She appears well-developed and well-nourished.  HENT:  Head: Normocephalic and atraumatic.  Eyes: EOM are normal. Pupils are equal, round, and reactive to light.  Neck: Neck supple. No JVD present.  Cardiovascular: Normal rate, regular rhythm and normal heart sounds.   No murmur  heard. Pulmonary/Chest: Effort normal. No respiratory distress.  Abdominal: Soft. She exhibits no distension. There is no tenderness. There is no rebound and no guarding.  Neurological: She is alert and oriented to person, place, and time.  Cerebellar exam is normal (finger to nose) Sensory exam normal for bilateral upper and lower extremities - and patient is able to discriminate between sharp and dull. Motor exam is 4+/5   Skin: Skin is warm and dry.    ED Course  Procedures (including critical care time) Labs Review Labs Reviewed  CBC - Abnormal; Notable for the following:    WBC 15.5 (*)    All other components within normal limits  PRO B NATRIURETIC PEPTIDE - Abnormal; Notable for the following:    Pro B Natriuretic peptide (BNP) 191.6 (*)    All other components within normal limits  BASIC METABOLIC PANEL  TROPONIN I  POCT I-STAT TROPONIN I   Imaging Review Dg Chest 2 View  08/24/2013   CLINICAL DATA:  Headache, cough, chest pain, shortness of breath, history hypertension, coronary artery disease, cardiomyopathy, CHF, smoking  EXAM: CHEST  2 VIEW  COMPARISON:  08/05/2013  FINDINGS: Normal heart size, mediastinal contours, and pulmonary vascularity.  Lungs clear.  No pleural effusion or pneumothorax.  Prior cervical spine fusion.  IMPRESSION: No acute abnormalities.   Electronically Signed   By: Ulyses Southward M.D.   On: 08/24/2013 00:16    EKG Interpretation    Date/Time:  Thursday August 23 2013 22:50:42 EST Ventricular Rate:  91 PR Interval:  118 QRS Duration: 86 QT Interval:  350 QTC Calculation: 430 R Axis:   84 Text Interpretation:  Normal sinus rhythm Nonspecific ST abnormality Abnormal ECG No acute findings Confirmed by Katerine Morua, MD, [place] ([id number]) on 08/24/2013 7:55:07 AM            MDM  No diagnosis found.  Pt comes in with cc of chest pain, facial pain. 2 independent processes. Has hx of Cardiomyopathy, and per one of her evals in the ED, a clean  cath and Cardiology clearance recently for chest pain. She has been pain free here. Her trops are neg, ekg is not concerning for acute process - will ask her to seek outpatient management.  Pt has left sided facial pain and jaw pain. Trigeminal neuralgia? Pt is awaiting CT face - ordered as she has leukocytosis and she reports subjective fevers. Likely dc.  Derwood Kaplan, MD 08/24/13 [phone]

## 2013-08-24 NOTE — ED Notes (Signed)
Patient family member refused for patient to be taken to CT when transporter came to pick pt up. After discussing with patient and family member that the patients vitals are stable-patient is requesting to now be taken to CT scan. CT made aware of this request.

## 2013-09-11 IMAGING — CR DG CHEST 1V PORT
1 series · 1 of 1 positions shown · non-contrast
Comparison: Chest x-ray 04/16/2012.

CLINICAL DATA: Chest pain.  Possible congestive heart failure.

PORTABLE CHEST - 1 VIEW

[AP]
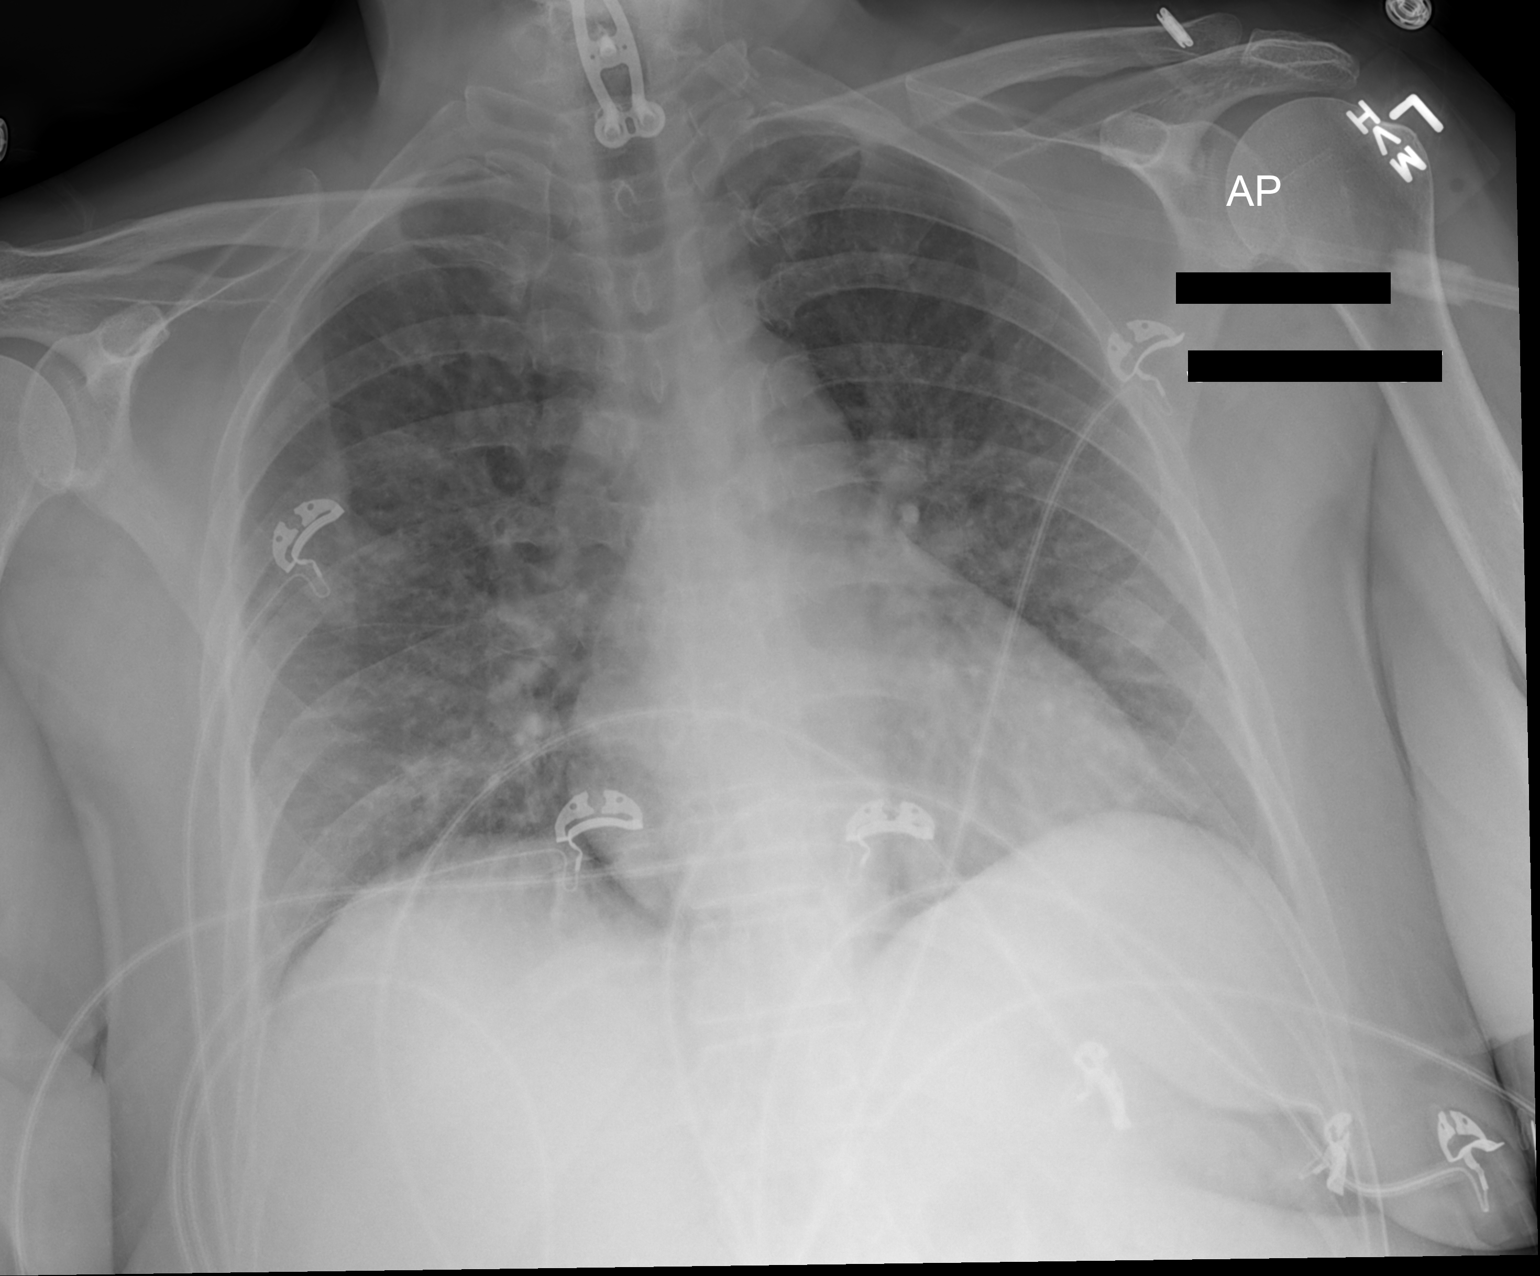

[1 of 1 positions shown; findings below may reference images not displayed]

FINDINGS: There is cephalization of the pulmonary vasculature and
slight indistinctness of the interstitial markings suggestive of
mild pulmonary edema.  Mild cardiomegaly.  No pleural effusions.
Upper mediastinal contours are within normal limits.  Orthopedic
fixation hardware in the lower cervical spine is incidentally
noted.
IMPRESSION: Findings, as above, concern for very mild congestive heart failure.

## 2013-09-20 DIAGNOSIS — G8929 Other chronic pain: Secondary | ICD-10-CM | POA: Diagnosis not present

## 2013-09-20 DIAGNOSIS — Z6831 Body mass index (BMI) 31.0-31.9, adult: Secondary | ICD-10-CM | POA: Diagnosis not present

## 2013-09-20 DIAGNOSIS — I10 Essential (primary) hypertension: Secondary | ICD-10-CM | POA: Diagnosis not present

## 2013-09-27 ENCOUNTER — Other Ambulatory Visit: Payer: Self-pay | Admitting: Gynecology

## 2013-09-27 ENCOUNTER — Ambulatory Visit (INDEPENDENT_AMBULATORY_CARE_PROVIDER_SITE_OTHER): Payer: Medicare Other

## 2013-09-27 DIAGNOSIS — E2749 Other adrenocortical insufficiency: Secondary | ICD-10-CM | POA: Diagnosis not present

## 2013-09-27 DIAGNOSIS — M949 Disorder of cartilage, unspecified: Secondary | ICD-10-CM

## 2013-09-27 DIAGNOSIS — M899 Disorder of bone, unspecified: Secondary | ICD-10-CM

## 2013-09-27 DIAGNOSIS — E271 Primary adrenocortical insufficiency: Secondary | ICD-10-CM

## 2013-09-27 DIAGNOSIS — M8580 Other specified disorders of bone density and structure, unspecified site: Secondary | ICD-10-CM

## 2013-10-03 ENCOUNTER — Emergency Department (HOSPITAL_COMMUNITY)
Admission: EM | Admit: 2013-10-03 | Discharge: 2013-10-03 | Disposition: A | Discharge status: Home / Self Care | Payer: Medicare Other | Attending: Emergency Medicine | Admitting: Emergency Medicine

## 2013-10-03 ENCOUNTER — Encounter (HOSPITAL_COMMUNITY): Payer: Self-pay | Admitting: Emergency Medicine

## 2013-10-03 ENCOUNTER — Emergency Department (HOSPITAL_COMMUNITY): Payer: Medicare Other

## 2013-10-03 DIAGNOSIS — M545 Low back pain, unspecified: Secondary | ICD-10-CM | POA: Insufficient documentation

## 2013-10-03 DIAGNOSIS — M79609 Pain in unspecified limb: Secondary | ICD-10-CM | POA: Diagnosis not present

## 2013-10-03 DIAGNOSIS — F411 Generalized anxiety disorder: Secondary | ICD-10-CM | POA: Diagnosis not present

## 2013-10-03 DIAGNOSIS — IMO0002 Reserved for concepts with insufficient information to code with codable children: Secondary | ICD-10-CM | POA: Diagnosis not present

## 2013-10-03 DIAGNOSIS — Z79899 Other long term (current) drug therapy: Secondary | ICD-10-CM | POA: Diagnosis not present

## 2013-10-03 DIAGNOSIS — F17200 Nicotine dependence, unspecified, uncomplicated: Secondary | ICD-10-CM | POA: Insufficient documentation

## 2013-10-03 DIAGNOSIS — Z9889 Other specified postprocedural states: Secondary | ICD-10-CM | POA: Insufficient documentation

## 2013-10-03 DIAGNOSIS — Z8674 Personal history of sudden cardiac arrest: Secondary | ICD-10-CM | POA: Diagnosis not present

## 2013-10-03 DIAGNOSIS — Z8719 Personal history of other diseases of the digestive system: Secondary | ICD-10-CM | POA: Insufficient documentation

## 2013-10-03 DIAGNOSIS — E039 Hypothyroidism, unspecified: Secondary | ICD-10-CM | POA: Insufficient documentation

## 2013-10-03 DIAGNOSIS — I10 Essential (primary) hypertension: Secondary | ICD-10-CM | POA: Diagnosis not present

## 2013-10-03 DIAGNOSIS — G8929 Other chronic pain: Secondary | ICD-10-CM | POA: Diagnosis not present

## 2013-10-03 DIAGNOSIS — Z7982 Long term (current) use of aspirin: Secondary | ICD-10-CM | POA: Insufficient documentation

## 2013-10-03 DIAGNOSIS — I2510 Atherosclerotic heart disease of native coronary artery without angina pectoris: Secondary | ICD-10-CM | POA: Diagnosis not present

## 2013-10-03 DIAGNOSIS — Z880 Allergy status to penicillin: Secondary | ICD-10-CM | POA: Diagnosis not present

## 2013-10-03 DIAGNOSIS — M5137 Other intervertebral disc degeneration, lumbosacral region: Secondary | ICD-10-CM | POA: Diagnosis not present

## 2013-10-03 DIAGNOSIS — I509 Heart failure, unspecified: Secondary | ICD-10-CM | POA: Diagnosis not present

## 2013-10-03 DIAGNOSIS — M549 Dorsalgia, unspecified: Secondary | ICD-10-CM

## 2013-10-03 LAB — POCT I-STAT, CHEM 8
BUN: 5 mg/dL — ABNORMAL LOW (ref 6–23)
Calcium, Ion: 1.08 mmol/L — ABNORMAL LOW (ref 1.12–1.23)
Chloride: 102 mEq/L (ref 96–112)
Creatinine, Ser: 1.1 mg/dL (ref 0.50–1.10)
GLUCOSE: 93 mg/dL (ref 70–99)
HCT: 44 % (ref 36.0–46.0)
HEMOGLOBIN: 15 g/dL (ref 12.0–15.0)
Potassium: 3.7 mEq/L (ref 3.7–5.3)
Sodium: 140 mEq/L (ref 137–147)
TCO2: 25 mmol/L (ref 0–100)

## 2013-10-03 MED ORDER — SODIUM CHLORIDE 0.9 % IV SOLN: INTRAVENOUS | Status: DC

## 2013-10-03 MED ORDER — OXYCODONE-ACETAMINOPHEN 5-325 MG PO TABS
1.0000 | ORAL_TABLET | Freq: Once | ORAL | Status: AC
  Administered 2013-10-03: 1 via ORAL
  Filled 2013-10-03: qty 1

## 2013-10-03 NOTE — ED Provider Notes (Signed)
CSN: [id number]     Arrival date & time 10/03/13  1696 History   First MD Initiated Contact with Patient 10/03/13 [phone]     Chief Complaint  Patient presents with  . Leg Pain    bilateral    Patient is a 48 y.o. female presenting with leg pain. The history is provided by the patient.  Leg Pain Location:  Leg and hip Time since incident:  2 weeks Injury: no   Hip location:  L hip and R hip Pain details:    Quality:  Aching   Radiates to:  Does not radiate   Severity:  Moderate   Duration:  2 weeks   Progression:  Waxing and waning Relieved by:  Nothing Worsened by:  Activity Associated symptoms: back pain   Associated symptoms: no fatigue and no fever     Past Medical History  Diagnosis Date  . Cardiomyopathy     resolved  . Chest pain     chronicc  . Hyperlipidemia   . HTN (hypertension)   . Hypothyroidism   . Adrenal insufficiency     diagnosed 2012  . Anxiety   . Nondiabetic gastroparesis   . CAD (coronary artery disease)     Cath 2008 EF normal. RCA 50-60, Septal 50%. Myoview 3/12: EF 53% normal perfusion  . Chronic back pain   . QT prolongation   . Mitral valve prolapse   . Gastroparesis   . Chronic diarrhea   . Astigmatism   . Concussion     sept 28th 2014  . CHF (congestive heart failure)   . Cardiac arrest     2/2 adissonian crisis  . Tobacco abuse     down to 2 cigarettes per day   Past Surgical History  Procedure Laterality Date  . Cholecystectomy    . Spine surgery    . Vesicovaginal fistula closure w/ tah    . Varicose vein surgery    . Abdominal hysterectomy    . Cardiac catheterization  03/2007    showed 60% lesion in the right coronary artery   Family History  Problem Relation Age of Onset  . Coronary artery disease    . Heart attack Mother   . Cancer Father     Colon   History  Substance Use Topics  . Smoking status: Current Every Day Smoker -- 0.50 packs/day for 10 years    Types: Cigarettes  . Smokeless tobacco: Never Used     Comment: smokes 6-7 cigarettes per day  . Alcohol Use: No   OB History   Grav Para Term Preterm Abortions TAB SAB Ect Mult Living   3 2 2  0 1  1   2      Review of Systems  Constitutional: Negative for fever and fatigue.  Musculoskeletal: Positive for back pain.  All other systems reviewed and are negative.    Allergies  Bee venom; Penicillins; Cephalexin; Doxycycline; Dust mite extract; Erythromycin; Ibuprofen; Morphine and related; Sulfonamide derivatives; and Tramadol  Home Medications   Current Outpatient Rx  Name  Route  Sig  Dispense  Refill  . amLODipine (NORVASC) 10 MG tablet   Oral   Take 10 mg by mouth daily.         Marland Kitchen aspirin 81 MG chewable tablet   Oral   Chew 81 mg by mouth daily. Daily dose is 1 tab, takes 4 tabs if feeling chest pain or pressure         . cyclobenzaprine (FLEXERIL)  10 MG tablet   Oral   Take 10 mg by mouth as needed for muscle spasms.         . hydrocortisone (CORTEF) 10 MG tablet   Oral   Take 10-20 mg by mouth 2 (two) times daily. Pt takes 20 mg in the morning and 10 mg at night         . levothyroxine (SYNTHROID, LEVOTHROID) 75 MCG tablet   Oral   Take 75 mcg by mouth daily before breakfast.         . LORazepam (ATIVAN) 2 MG tablet   Oral   Take 2 mg by mouth every 6 (six) hours as needed for anxiety.         Marland Kitchen losartan (COZAAR) 100 MG tablet   Oral   Take 100 mg by mouth daily.         . metoCLOPramide (REGLAN) 10 MG tablet   Oral   Take 20 mg by mouth 3 (three) times daily as needed. Runny stools         . mirtazapine (REMERON) 15 MG tablet   Oral   Take 15 mg by mouth at bedtime.         . nitroGLYCERIN (NITROSTAT) 0.4 MG SL tablet   Sublingual   Place 0.4 mg under the tongue every 5 (five) minutes as needed for chest pain.         Marland Kitchen ondansetron (ZOFRAN) 4 MG tablet   Oral   Take 4-8 mg by mouth every 4 (four) hours as needed. For nausea         . oxycodone (ROXICODONE) 30 MG immediate  release tablet   Oral   Take 30 mg by mouth every 6 (six) hours as needed. For pain         . meclizine (ANTIVERT) 25 MG tablet   Oral   Take 1 tablet (25 mg total) by mouth 4 (four) times daily as needed for dizziness.   60 tablet   0    There were no vitals taken for this visit. Physical Exam  Nursing note and vitals reviewed. Constitutional: She appears well-developed and well-nourished. No distress.  HENT:  Head: Normocephalic and atraumatic.  Right Ear: External ear normal.  Left Ear: External ear normal.  Nose: Nose normal.  Eyes: Conjunctivae and EOM are normal. Right eye exhibits no discharge. Left eye exhibits no discharge. No scleral icterus.  Neck: Neck supple. No tracheal deviation present.  Cardiovascular: Normal rate, regular rhythm and intact distal pulses.   Pulmonary/Chest: Effort normal and breath sounds normal. No stridor. No respiratory distress. She has no wheezes. She has no rales.  Abdominal: Soft. Bowel sounds are normal. She exhibits no distension. There is no tenderness. There is no rebound and no guarding.  Musculoskeletal: She exhibits no edema.       Lumbar back: She exhibits tenderness. She exhibits no swelling, no edema and no pain.  Neurological: She is alert. She has normal strength. She is not disoriented. No cranial nerve deficit (no facial droop, extraocular movements intact, no slurred speech) or sensory deficit. She exhibits normal muscle tone. She displays no seizure activity. Coordination normal.  Reflex Scores:      Patellar reflexes are 2+ on the right side and 2+ on the left side.      Achilles reflexes are 2+ on the right side and 2+ on the left side. 5/5 strength bilateral lower extremity, sensation to light touch intact  Skin: Skin is warm  and dry. No rash noted. She is not diaphoretic. No erythema.  Psychiatric: She has a normal mood and affect. Her behavior is normal. Thought content normal.    ED Course  Procedures (including  critical care time) Labs Review Labs Reviewed  POCT I-STAT, CHEM 8 - Abnormal; Notable for the following:    BUN 5 (*)    Calcium, Ion 1.08 (*)    All other components within normal limits   Imaging Review Dg Lumbar Spine Complete  10/03/2013   CLINICAL DATA:  Low back and bilateral leg pain.  EXAM: LUMBAR SPINE - COMPLETE 4+ VIEW  COMPARISON:  Radiographs and MRI dated 09/20/2012  FINDINGS: There is no fracture, subluxation, or bone destruction. There is slight right facet arthritis at L3-4 and L5-S1, unchanged.  IMPRESSION: No acute abnormality of the lumbar spine. Degenerative changes as described.   Electronically Signed   By: Rozetta Nunnery M.D.   On: 10/03/2013 10:18      MDM   1. Back pain     Pt has symptoms suggesting possible sciatica symptoms although normal strength and sensation.  Exam does not suggest acute neurosugical condition ie cauda equina.  Pt does have chronic neck problems.  Recc follow up with pcp or orthopedic doctor.  May benefit from MRI if symptoms persist.    Kathalene Frames, MD 10/03/13 1039

## 2013-10-03 NOTE — ED Notes (Signed)
Bed: GE95 Expected date:  Expected time:  Means of arrival:  Comments: "chronic leg heaviness"

## 2013-10-03 NOTE — ED Notes (Signed)
Per EMS: pt having bilateral chronic leg pain and heaviness, increased over the past two days. Pt slept during transport.

## 2013-10-03 NOTE — Discharge Instructions (Signed)
Back Exercises Back exercises help treat and prevent back injuries. The goal of back exercises is to increase the strength of your abdominal and back muscles and the flexibility of your back. These exercises should be started when you no longer have back pain. Back exercises include:  Pelvic Tilt. Lie on your back with your knees bent. Tilt your pelvis until the lower part of your back is against the floor. Hold this position 5 to 10 sec and repeat 5 to 10 times.  Knee to Chest. Pull first 1 knee up against your chest and hold for 20 to 30 seconds, repeat this with the other knee, and then both knees. This may be done with the other leg straight or bent, whichever feels better.  Sit-Ups or Curl-Ups. Bend your knees 90 degrees. Start with tilting your pelvis, and do a partial, slow sit-up, lifting your trunk only 30 to 45 degrees off the floor. Take at least 2 to 3 seconds for each sit-up. Do not do sit-ups with your knees out straight. If partial sit-ups are difficult, simply do the above but with only tightening your abdominal muscles and holding it as directed.  Hip-Lift. Lie on your back with your knees flexed 90 degrees. Push down with your feet and shoulders as you raise your hips a couple inches off the floor; hold for 10 seconds, repeat 5 to 10 times.  Back arches. Lie on your stomach, propping yourself up on bent elbows. Slowly press on your hands, causing an arch in your low back. Repeat 3 to 5 times. Any initial stiffness and discomfort should lessen with repetition over time.  Shoulder-Lifts. Lie face down with arms beside your body. Keep hips and torso pressed to floor as you slowly lift your head and shoulders off the floor. Do not overdo your exercises, especially in the beginning. Exercises may cause you some mild back discomfort which lasts for a few minutes; however, if the pain is more severe, or lasts for more than 15 minutes, do not continue exercises until you see your caregiver.  Improvement with exercise therapy for back problems is slow.  See your caregivers for assistance with developing a proper back exercise program. Document Released: 09/30/2004 Document Revised: 11/15/2011 Document Reviewed: 06/24/2011 [employer] Patient Information 2014 [employer].  
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
## 2013-10-17 DIAGNOSIS — Z6832 Body mass index (BMI) 32.0-32.9, adult: Secondary | ICD-10-CM | POA: Diagnosis not present

## 2013-10-17 DIAGNOSIS — G8929 Other chronic pain: Secondary | ICD-10-CM | POA: Diagnosis not present

## 2013-11-15 DIAGNOSIS — Z6832 Body mass index (BMI) 32.0-32.9, adult: Secondary | ICD-10-CM | POA: Diagnosis not present

## 2013-11-15 DIAGNOSIS — Z79899 Other long term (current) drug therapy: Secondary | ICD-10-CM | POA: Diagnosis not present

## 2013-11-15 DIAGNOSIS — I10 Essential (primary) hypertension: Secondary | ICD-10-CM | POA: Diagnosis not present

## 2013-11-15 DIAGNOSIS — G8929 Other chronic pain: Secondary | ICD-10-CM | POA: Diagnosis not present

## 2013-11-15 DIAGNOSIS — E039 Hypothyroidism, unspecified: Secondary | ICD-10-CM | POA: Diagnosis not present

## 2013-11-16 ENCOUNTER — Other Ambulatory Visit (HOSPITAL_COMMUNITY): Payer: Self-pay | Admitting: Internal Medicine

## 2013-11-16 DIAGNOSIS — K769 Liver disease, unspecified: Secondary | ICD-10-CM

## 2013-11-16 DIAGNOSIS — R740 Nonspecific elevation of levels of transaminase and lactic acid dehydrogenase [LDH]: Secondary | ICD-10-CM

## 2013-11-16 DIAGNOSIS — R7401 Elevation of levels of liver transaminase levels: Secondary | ICD-10-CM

## 2013-11-16 DIAGNOSIS — R748 Abnormal levels of other serum enzymes: Secondary | ICD-10-CM

## 2013-11-16 DIAGNOSIS — R899 Unspecified abnormal finding in specimens from other organs, systems and tissues: Secondary | ICD-10-CM

## 2013-11-16 DIAGNOSIS — E2740 Unspecified adrenocortical insufficiency: Secondary | ICD-10-CM

## 2013-11-16 DIAGNOSIS — R634 Abnormal weight loss: Secondary | ICD-10-CM

## 2013-11-21 ENCOUNTER — Ambulatory Visit (HOSPITAL_COMMUNITY)
Admission: RE | Admit: 2013-11-21 | Discharge: 2013-11-21 | Disposition: A | Discharge status: Home / Self Care | Payer: Medicare Other | Source: Ambulatory Visit | Attending: Internal Medicine | Admitting: Internal Medicine

## 2013-11-21 DIAGNOSIS — R7401 Elevation of levels of liver transaminase levels: Secondary | ICD-10-CM | POA: Insufficient documentation

## 2013-11-21 DIAGNOSIS — Z9089 Acquired absence of other organs: Secondary | ICD-10-CM | POA: Insufficient documentation

## 2013-11-21 DIAGNOSIS — R634 Abnormal weight loss: Secondary | ICD-10-CM | POA: Diagnosis not present

## 2013-11-21 DIAGNOSIS — E2740 Unspecified adrenocortical insufficiency: Secondary | ICD-10-CM

## 2013-11-21 DIAGNOSIS — R6889 Other general symptoms and signs: Secondary | ICD-10-CM | POA: Diagnosis not present

## 2013-11-21 DIAGNOSIS — R748 Abnormal levels of other serum enzymes: Secondary | ICD-10-CM | POA: Diagnosis not present

## 2013-11-21 DIAGNOSIS — R7402 Elevation of levels of lactic acid dehydrogenase (LDH): Secondary | ICD-10-CM | POA: Insufficient documentation

## 2013-11-21 DIAGNOSIS — K769 Liver disease, unspecified: Secondary | ICD-10-CM | POA: Diagnosis not present

## 2013-11-21 DIAGNOSIS — R740 Nonspecific elevation of levels of transaminase and lactic acid dehydrogenase [LDH]: Secondary | ICD-10-CM

## 2013-11-21 DIAGNOSIS — R899 Unspecified abnormal finding in specimens from other organs, systems and tissues: Secondary | ICD-10-CM

## 2013-11-26 ENCOUNTER — Other Ambulatory Visit (HOSPITAL_COMMUNITY): Payer: Self-pay | Admitting: Internal Medicine

## 2013-11-26 DIAGNOSIS — J90 Pleural effusion, not elsewhere classified: Secondary | ICD-10-CM

## 2013-11-29 ENCOUNTER — Ambulatory Visit (HOSPITAL_COMMUNITY)
Admission: RE | Admit: 2013-11-29 | Discharge: 2013-11-29 | Disposition: A | Discharge status: Home / Self Care | Payer: Medicare Other | Source: Ambulatory Visit | Attending: Internal Medicine | Admitting: Internal Medicine

## 2013-11-29 DIAGNOSIS — R509 Fever, unspecified: Secondary | ICD-10-CM | POA: Diagnosis not present

## 2013-11-29 DIAGNOSIS — J90 Pleural effusion, not elsewhere classified: Secondary | ICD-10-CM | POA: Diagnosis not present

## 2013-12-05 IMAGING — MG MM DIGITAL SCREENING BILAT
4 series · 4 of 4 positions shown · non-contrast
Comparison: Previous exam(s).

CLINICAL DATA: Screening.

DIGITAL SCREENING BILATERAL MAMMOGRAM WITH CAD

[L CC]
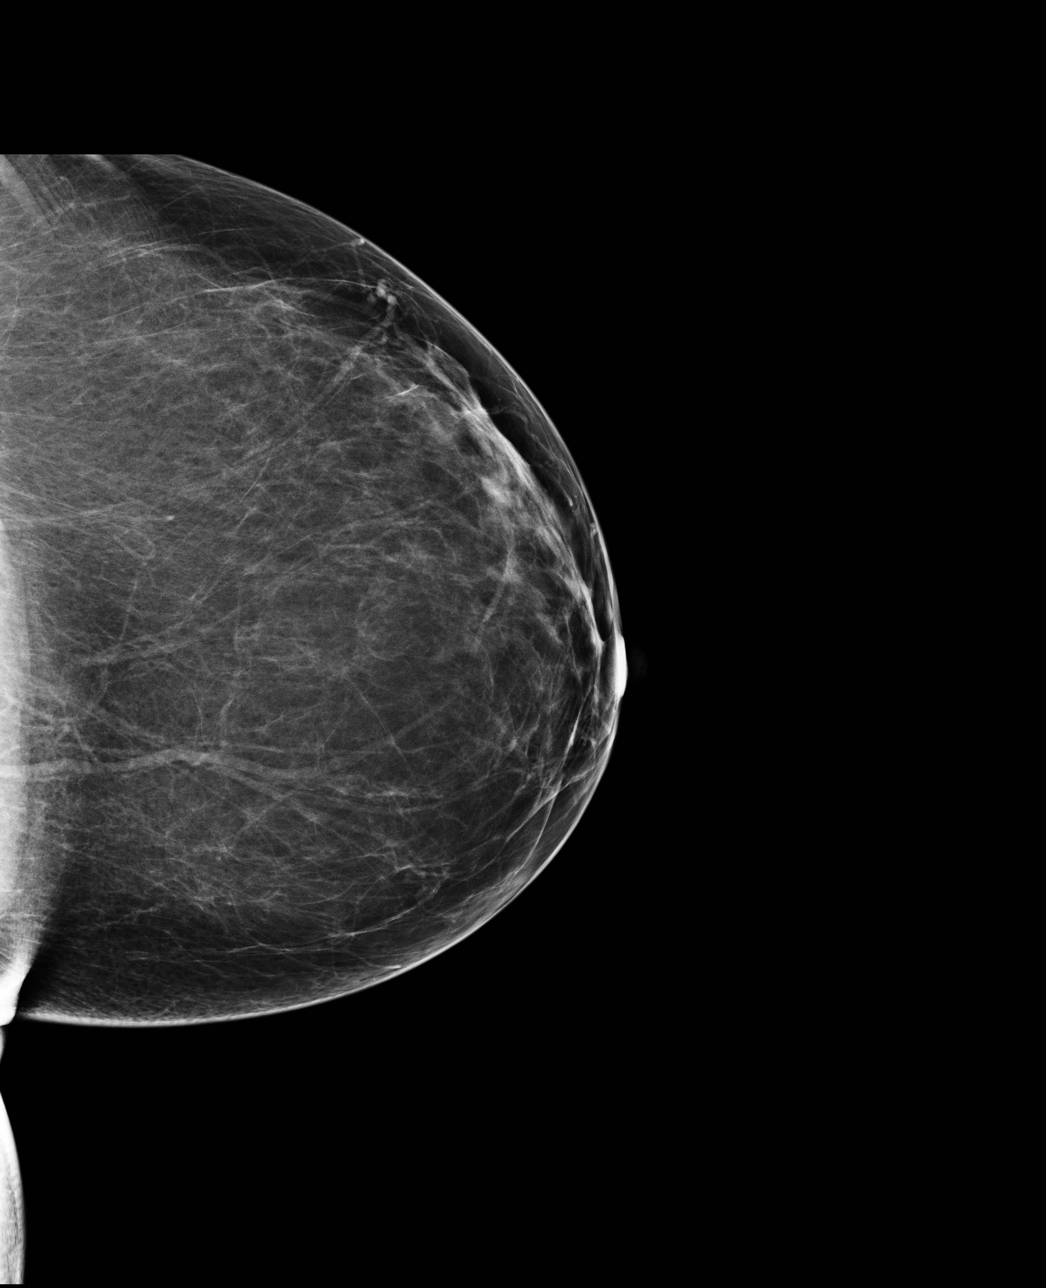

[L MLO]
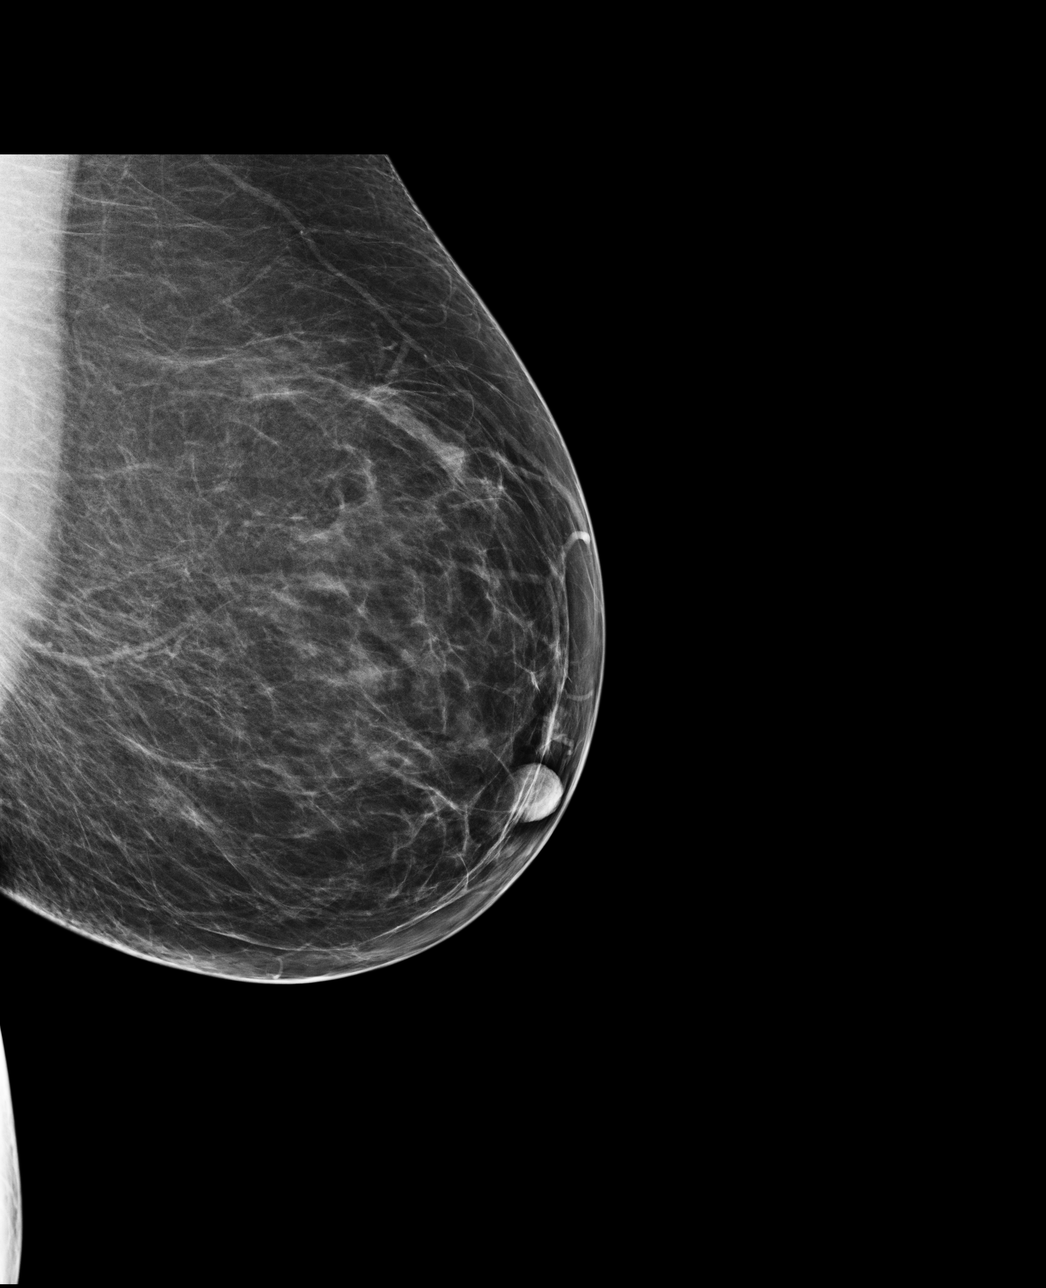

[R CC]
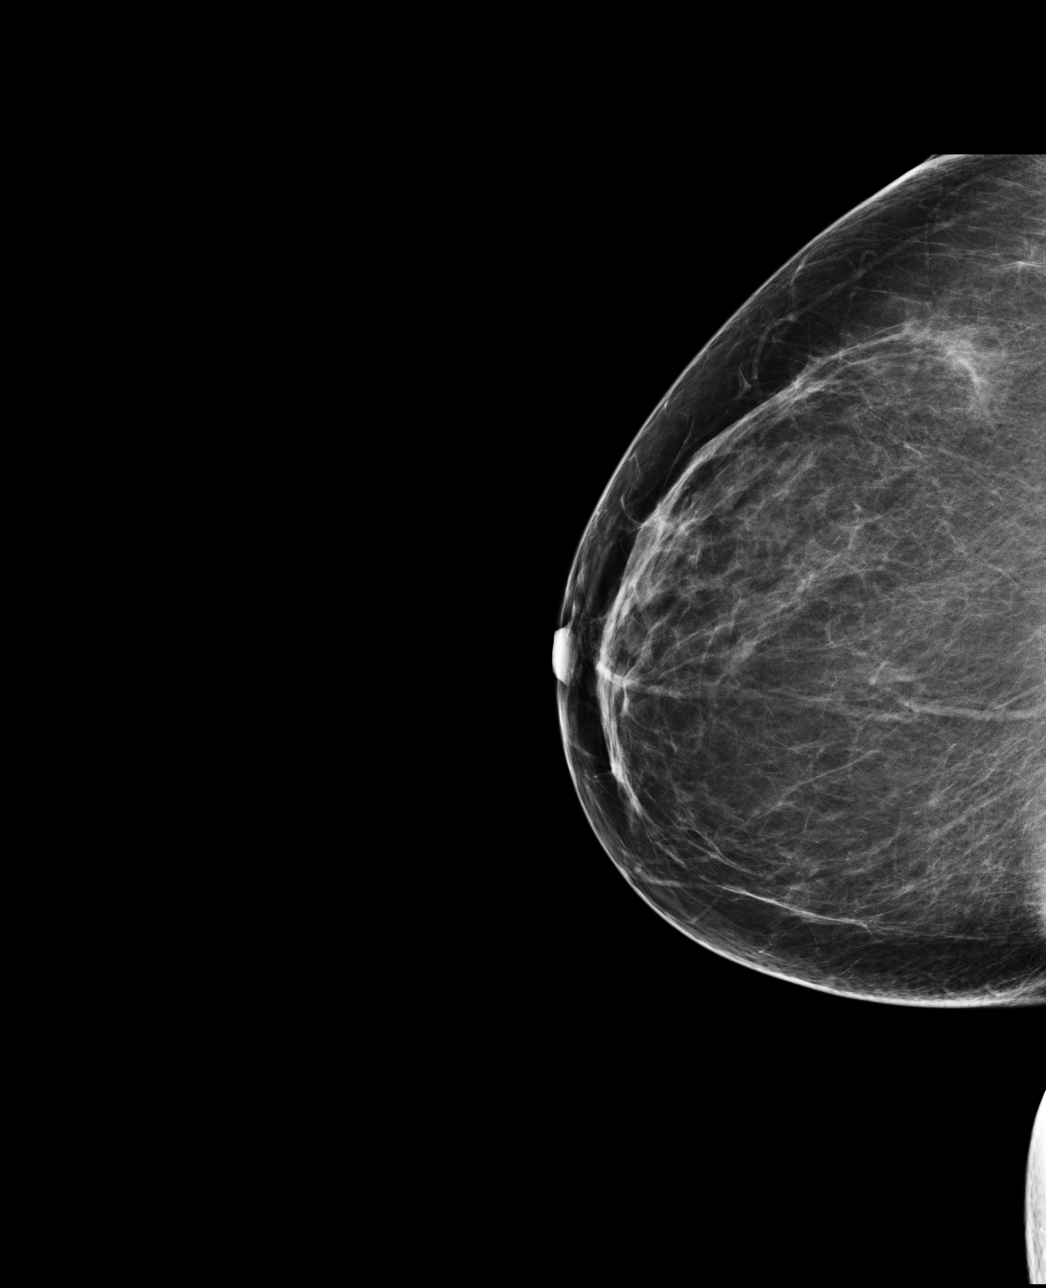

[R MLO]
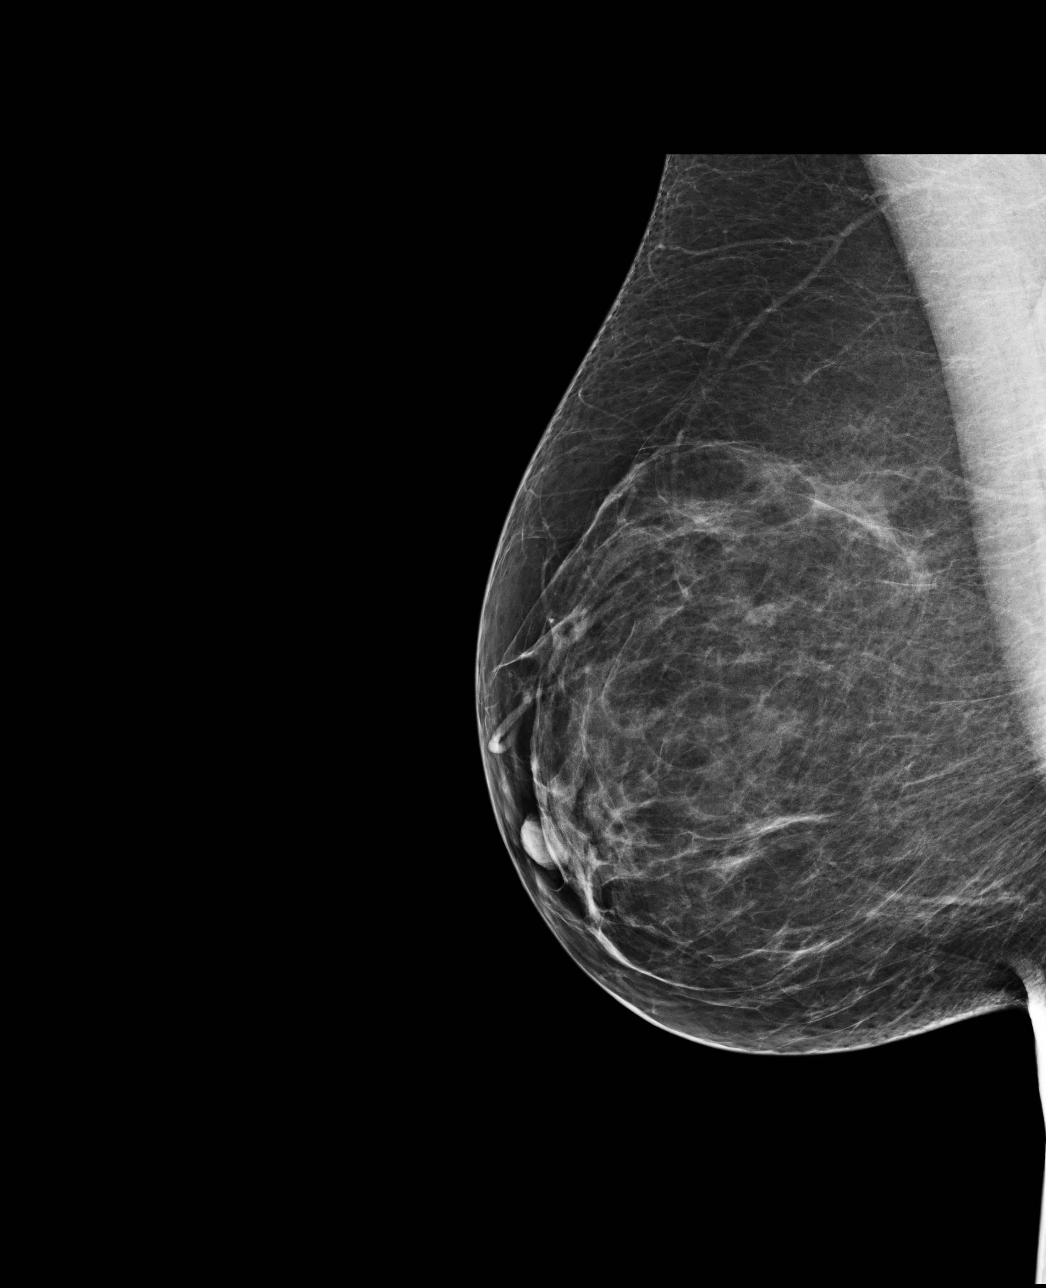

[4 of 4 positions shown; findings below may reference images not displayed]

FINDINGS: ACR Breast Density Category a:  The breast tissue is almost
entirely fatty.

There are no findings suspicious for malignancy.

Images were processed with CAD.
IMPRESSION: No mammographic evidence of malignancy.

A result letter of this screening mammogram will be mailed directly
to the patient.

RECOMMENDATION:
Screening mammogram in one year. (Code:H9-4-W6G)

BI-RADS CATEGORY 1:  Negative.

## 2013-12-25 DIAGNOSIS — I10 Essential (primary) hypertension: Secondary | ICD-10-CM | POA: Diagnosis not present

## 2013-12-25 DIAGNOSIS — Z6830 Body mass index (BMI) 30.0-30.9, adult: Secondary | ICD-10-CM | POA: Diagnosis not present

## 2013-12-25 DIAGNOSIS — G8929 Other chronic pain: Secondary | ICD-10-CM | POA: Diagnosis not present

## 2013-12-25 DIAGNOSIS — F411 Generalized anxiety disorder: Secondary | ICD-10-CM | POA: Diagnosis not present
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
## 2014-01-21 DIAGNOSIS — Z6830 Body mass index (BMI) 30.0-30.9, adult: Secondary | ICD-10-CM | POA: Diagnosis not present

## 2014-01-21 DIAGNOSIS — G8929 Other chronic pain: Secondary | ICD-10-CM | POA: Diagnosis not present

## 2014-01-21 DIAGNOSIS — J45909 Unspecified asthma, uncomplicated: Secondary | ICD-10-CM | POA: Diagnosis not present

## 2014-02-17 IMAGING — CR DG CERVICAL SPINE 2 OR 3 VIEWS
3 series · 3 of 3 positions shown · non-contrast
Comparison: Cervical MRI 05/03/2013

CLINICAL DATA: Fall 5 days ago. Neck pain. Prior cervical fusion

EXAM:
CERVICAL SPINE - 2-3 VIEW

[view not recorded (1 of 3)]
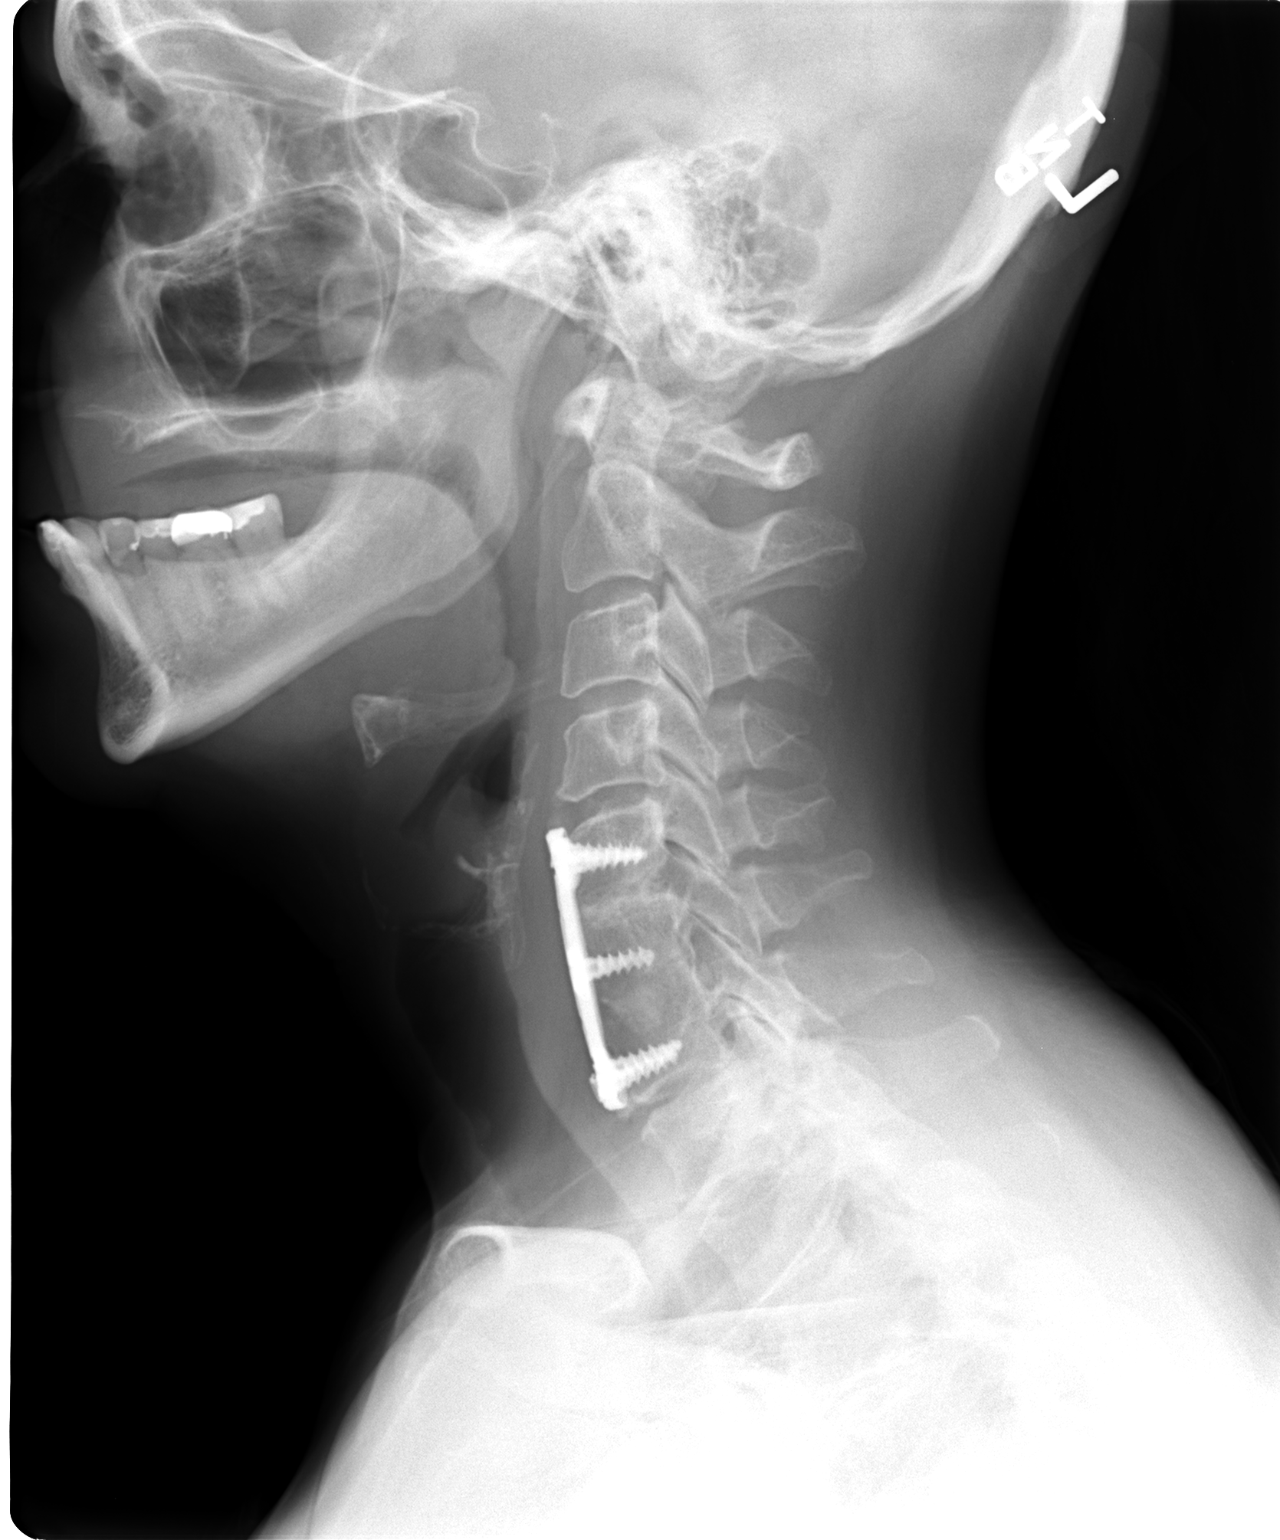

[view not recorded (2 of 3)]
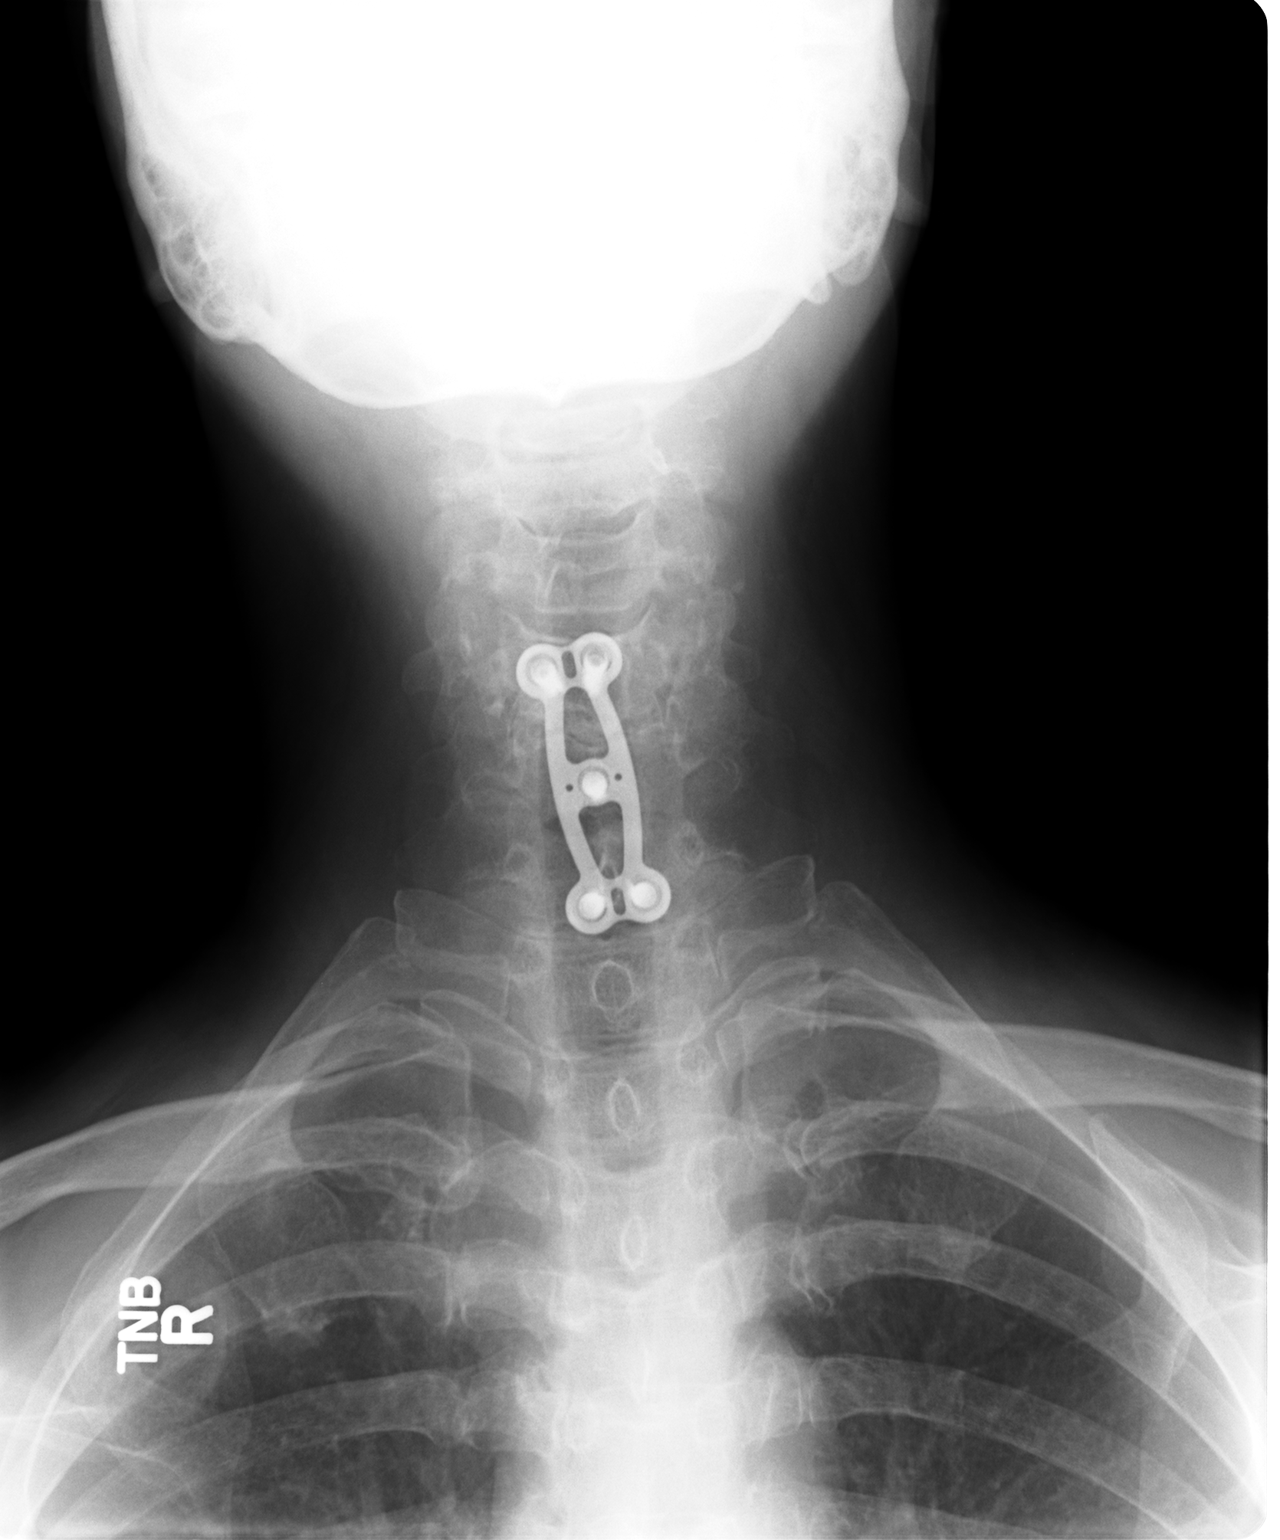

[view not recorded (3 of 3)]
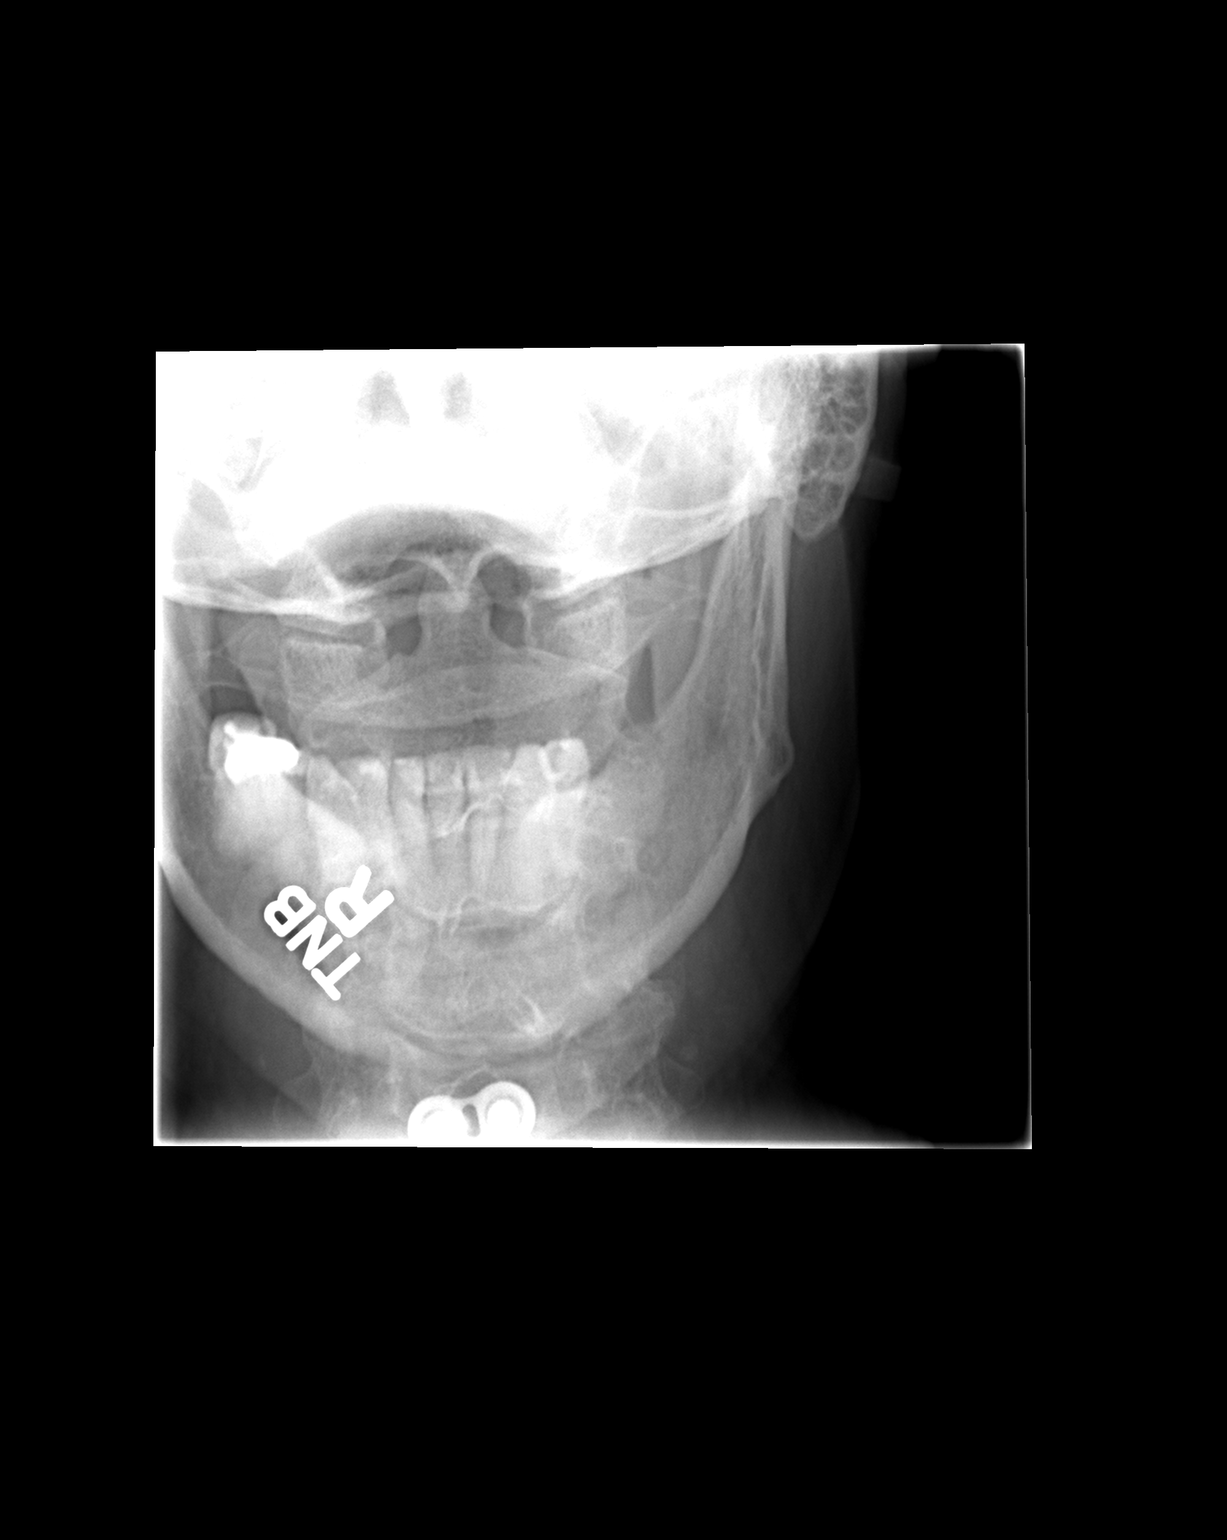

[3 of 3 positions shown; findings below may reference images not displayed]

FINDINGS: ACDF C5-6 and C6-7 with anterior plate and interbody bone graft.
Question pseudarthrosis at C5-6. Mild disc degeneration and spurring
C4-5

Negative for fracture.
IMPRESSION: Negative for fracture.

## 2014-02-18 DIAGNOSIS — I10 Essential (primary) hypertension: Secondary | ICD-10-CM | POA: Diagnosis not present

## 2014-02-18 DIAGNOSIS — Z6829 Body mass index (BMI) 29.0-29.9, adult: Secondary | ICD-10-CM | POA: Diagnosis not present

## 2014-02-18 DIAGNOSIS — G8929 Other chronic pain: Secondary | ICD-10-CM | POA: Diagnosis not present

## 2014-02-18 DIAGNOSIS — E039 Hypothyroidism, unspecified: Secondary | ICD-10-CM | POA: Diagnosis not present
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
## 2014-02-24 IMAGING — CR DG CHEST 2V
2 series · 2 of 2 positions shown · non-contrast
Comparison: Multiple priors.

CLINICAL DATA: Cough, fever, congestion.

EXAM:
CHEST  2 VIEW

[view not recorded (1 of 2)]
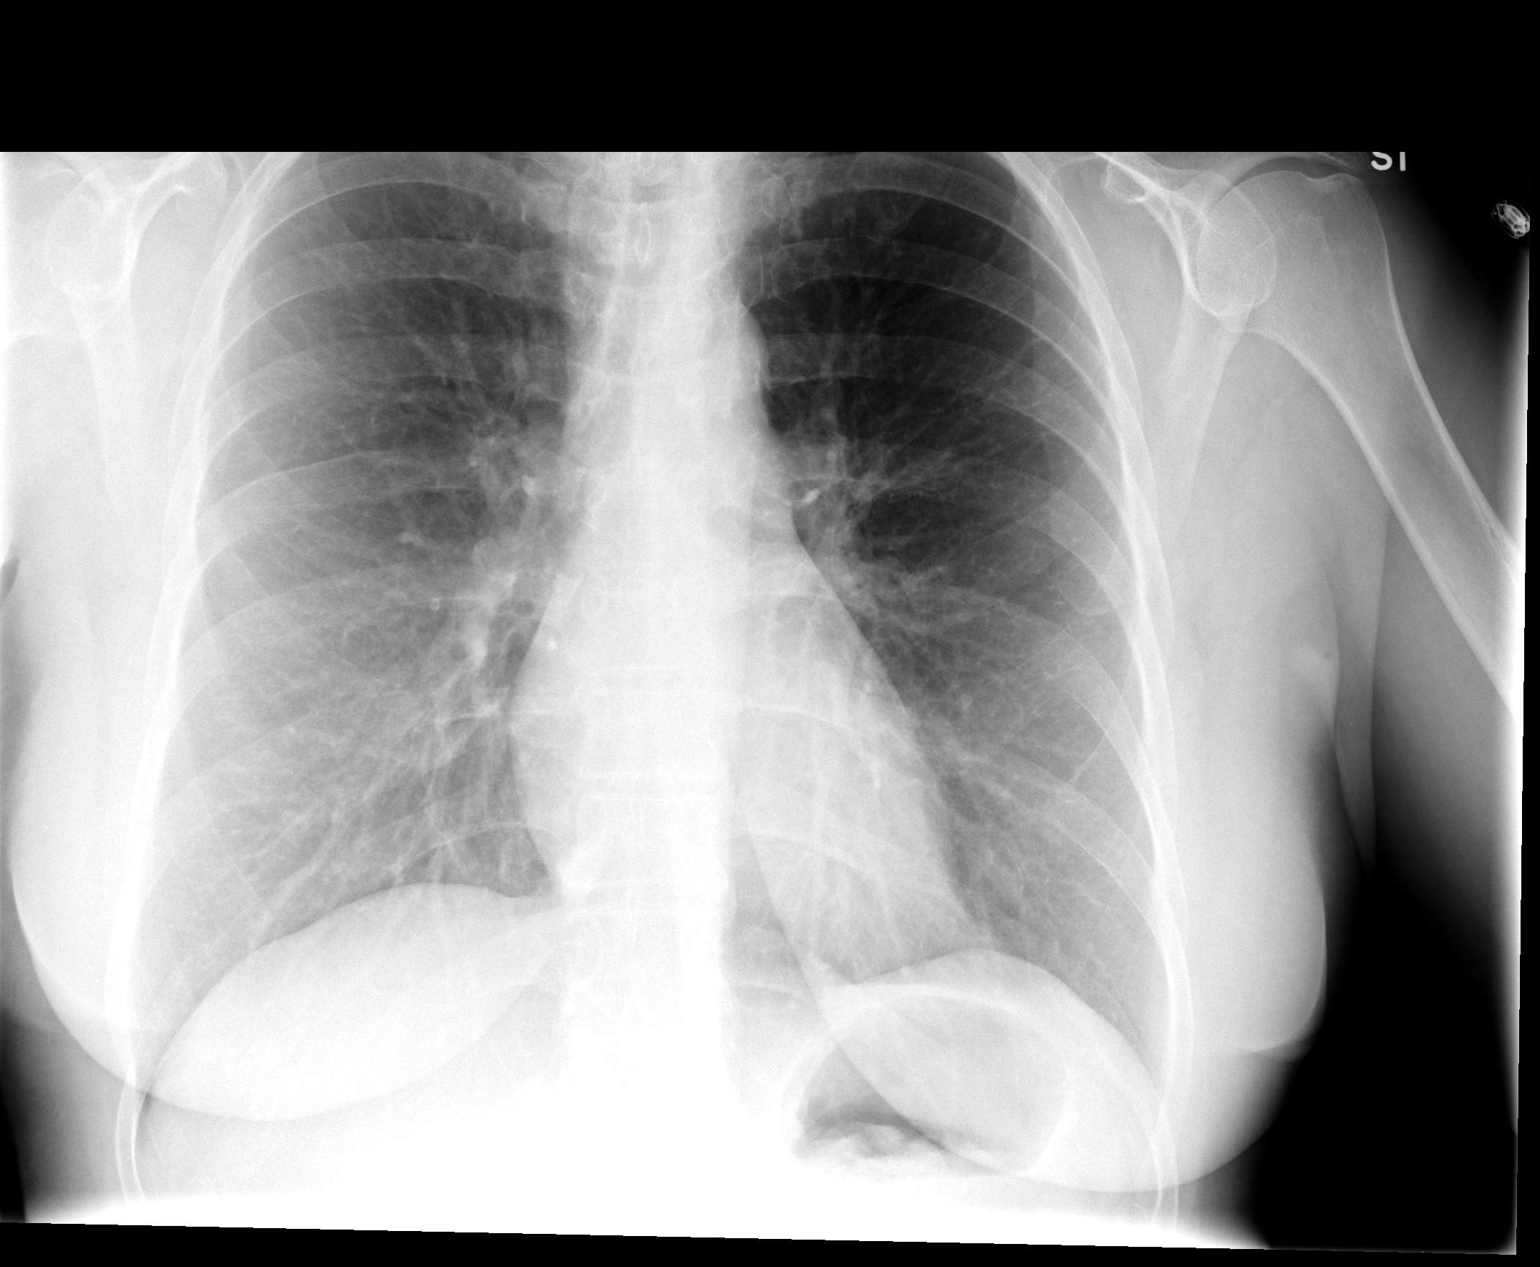

[view not recorded (2 of 2)]
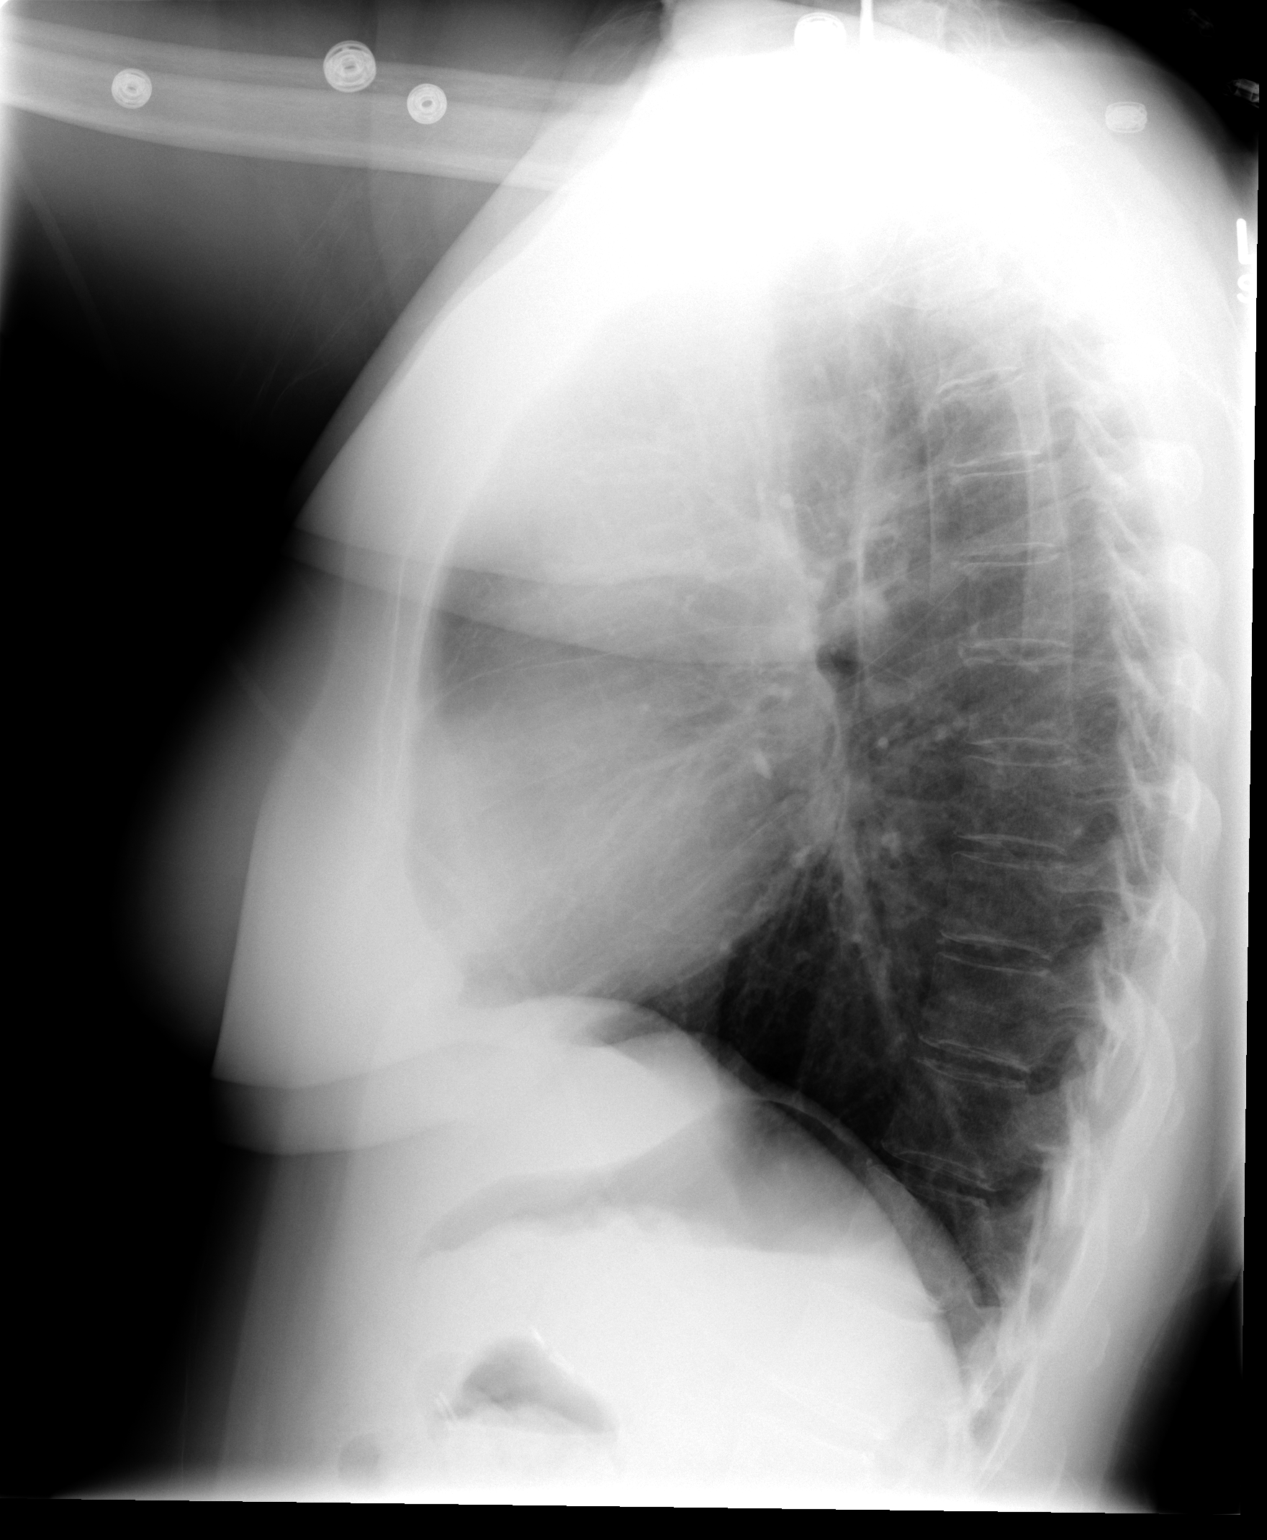

[2 of 2 positions shown; findings below may reference images not displayed]

FINDINGS: The heart size and mediastinal contours are within normal limits.
Both lungs are clear. The visualized skeletal structures are
unchanged.
IMPRESSION: No active cardiopulmonary disease.

## 2014-03-20 ENCOUNTER — Other Ambulatory Visit (HOSPITAL_COMMUNITY): Payer: Self-pay | Admitting: Internal Medicine

## 2014-03-20 ENCOUNTER — Ambulatory Visit (HOSPITAL_COMMUNITY)
Admission: RE | Admit: 2014-03-20 | Discharge: 2014-03-20 | Disposition: A | Discharge status: Home / Self Care | Payer: Medicare Other | Source: Ambulatory Visit | Attending: Internal Medicine | Admitting: Internal Medicine

## 2014-03-20 DIAGNOSIS — Z6830 Body mass index (BMI) 30.0-30.9, adult: Secondary | ICD-10-CM | POA: Diagnosis not present

## 2014-03-20 DIAGNOSIS — S8990XA Unspecified injury of unspecified lower leg, initial encounter: Secondary | ICD-10-CM | POA: Diagnosis not present

## 2014-03-20 DIAGNOSIS — R42 Dizziness and giddiness: Secondary | ICD-10-CM | POA: Diagnosis not present

## 2014-03-20 DIAGNOSIS — M25569 Pain in unspecified knee: Secondary | ICD-10-CM | POA: Insufficient documentation

## 2014-03-20 DIAGNOSIS — S99919A Unspecified injury of unspecified ankle, initial encounter: Secondary | ICD-10-CM | POA: Diagnosis not present

## 2014-03-20 DIAGNOSIS — W19XXXA Unspecified fall, initial encounter: Secondary | ICD-10-CM | POA: Insufficient documentation

## 2014-03-20 DIAGNOSIS — S99929A Unspecified injury of unspecified foot, initial encounter: Principal | ICD-10-CM

## 2014-03-20 DIAGNOSIS — S8000XA Contusion of unspecified knee, initial encounter: Secondary | ICD-10-CM | POA: Diagnosis not present

## 2014-03-20 DIAGNOSIS — G8929 Other chronic pain: Secondary | ICD-10-CM | POA: Diagnosis not present

## 2014-03-21 ENCOUNTER — Other Ambulatory Visit (HOSPITAL_COMMUNITY): Payer: Self-pay | Admitting: Internal Medicine

## 2014-03-21 DIAGNOSIS — R42 Dizziness and giddiness: Secondary | ICD-10-CM

## 2014-03-21 DIAGNOSIS — G8929 Other chronic pain: Secondary | ICD-10-CM

## 2014-03-27 ENCOUNTER — Ambulatory Visit (HOSPITAL_COMMUNITY)
Admission: RE | Admit: 2014-03-27 | Discharge: 2014-03-27 | Disposition: A | Discharge status: Home / Self Care | Payer: Medicare Other | Source: Ambulatory Visit | Attending: Internal Medicine | Admitting: Internal Medicine

## 2014-03-27 DIAGNOSIS — R42 Dizziness and giddiness: Secondary | ICD-10-CM | POA: Insufficient documentation

## 2014-03-27 DIAGNOSIS — G8929 Other chronic pain: Secondary | ICD-10-CM

## 2014-03-29 ENCOUNTER — Emergency Department (HOSPITAL_COMMUNITY): Payer: Medicare Other

## 2014-03-29 ENCOUNTER — Emergency Department (HOSPITAL_COMMUNITY)
Admission: EM | Admit: 2014-03-29 | Discharge: 2014-03-29 | Disposition: A | Discharge status: Home / Self Care | Payer: Medicare Other | Attending: Emergency Medicine | Admitting: Emergency Medicine

## 2014-03-29 ENCOUNTER — Encounter (HOSPITAL_COMMUNITY): Payer: Self-pay | Admitting: Emergency Medicine

## 2014-03-29 DIAGNOSIS — Z87828 Personal history of other (healed) physical injury and trauma: Secondary | ICD-10-CM | POA: Diagnosis not present

## 2014-03-29 DIAGNOSIS — F411 Generalized anxiety disorder: Secondary | ICD-10-CM | POA: Insufficient documentation

## 2014-03-29 DIAGNOSIS — Z79899 Other long term (current) drug therapy: Secondary | ICD-10-CM | POA: Insufficient documentation

## 2014-03-29 DIAGNOSIS — I509 Heart failure, unspecified: Secondary | ICD-10-CM | POA: Diagnosis not present

## 2014-03-29 DIAGNOSIS — Z9889 Other specified postprocedural states: Secondary | ICD-10-CM | POA: Insufficient documentation

## 2014-03-29 DIAGNOSIS — M25569 Pain in unspecified knee: Secondary | ICD-10-CM | POA: Diagnosis not present

## 2014-03-29 DIAGNOSIS — Z880 Allergy status to penicillin: Secondary | ICD-10-CM | POA: Insufficient documentation

## 2014-03-29 DIAGNOSIS — K3184 Gastroparesis: Secondary | ICD-10-CM | POA: Diagnosis not present

## 2014-03-29 DIAGNOSIS — I2510 Atherosclerotic heart disease of native coronary artery without angina pectoris: Secondary | ICD-10-CM | POA: Insufficient documentation

## 2014-03-29 DIAGNOSIS — R42 Dizziness and giddiness: Secondary | ICD-10-CM | POA: Insufficient documentation

## 2014-03-29 DIAGNOSIS — F17200 Nicotine dependence, unspecified, uncomplicated: Secondary | ICD-10-CM | POA: Diagnosis not present

## 2014-03-29 DIAGNOSIS — E039 Hypothyroidism, unspecified: Secondary | ICD-10-CM | POA: Insufficient documentation

## 2014-03-29 DIAGNOSIS — G8929 Other chronic pain: Secondary | ICD-10-CM | POA: Insufficient documentation

## 2014-03-29 DIAGNOSIS — IMO0002 Reserved for concepts with insufficient information to code with codable children: Secondary | ICD-10-CM | POA: Insufficient documentation

## 2014-03-29 DIAGNOSIS — Z8669 Personal history of other diseases of the nervous system and sense organs: Secondary | ICD-10-CM | POA: Insufficient documentation

## 2014-03-29 DIAGNOSIS — I10 Essential (primary) hypertension: Secondary | ICD-10-CM | POA: Insufficient documentation

## 2014-03-29 DIAGNOSIS — Z7982 Long term (current) use of aspirin: Secondary | ICD-10-CM | POA: Diagnosis not present

## 2014-03-29 DIAGNOSIS — M25561 Pain in right knee: Secondary | ICD-10-CM

## 2014-03-29 LAB — URINALYSIS, ROUTINE W REFLEX MICROSCOPIC
Bilirubin Urine: NEGATIVE
Glucose, UA: NEGATIVE mg/dL
HGB URINE DIPSTICK: NEGATIVE
Ketones, ur: NEGATIVE mg/dL
Leukocytes, UA: NEGATIVE
Nitrite: NEGATIVE
PROTEIN: NEGATIVE mg/dL
Specific Gravity, Urine: 1.013 (ref 1.005–1.030)
UROBILINOGEN UA: 1 mg/dL (ref 0.0–1.0)
pH: 6 (ref 5.0–8.0)

## 2014-03-29 LAB — BASIC METABOLIC PANEL
Anion gap: 11 (ref 5–15)
BUN: 11 mg/dL (ref 6–23)
CO2: 26 meq/L (ref 19–32)
Calcium: 8.7 mg/dL (ref 8.4–10.5)
Chloride: 104 mEq/L (ref 96–112)
Creatinine, Ser: 0.85 mg/dL (ref 0.50–1.10)
GFR calc non Af Amer: 79 mL/min — ABNORMAL LOW (ref >90)
Glucose, Bld: 103 mg/dL — ABNORMAL HIGH (ref 70–99)
POTASSIUM: 3.7 meq/L (ref 3.7–5.3)
Sodium: 141 mEq/L (ref 137–147)

## 2014-03-29 LAB — CBG MONITORING, ED: Glucose-Capillary: 118 mg/dL — ABNORMAL HIGH (ref 70–99)

## 2014-03-29 LAB — CBC WITH DIFFERENTIAL/PLATELET
Basophils Absolute: 0.1 10*3/uL (ref 0.0–0.1)
Basophils Relative: 1 % (ref 0–1)
Eosinophils Absolute: 0.2 10*3/uL (ref 0.0–0.7)
Eosinophils Relative: 2 % (ref 0–5)
HCT: 42.6 % (ref 36.0–46.0)
Hemoglobin: 14.4 g/dL (ref 12.0–15.0)
LYMPHS ABS: 2.5 10*3/uL (ref 0.7–4.0)
Lymphocytes Relative: 16 % (ref 12–46)
MCH: 29.3 pg (ref 26.0–34.0)
MCHC: 33.8 g/dL (ref 30.0–36.0)
MCV: 86.6 fL (ref 78.0–100.0)
MONOS PCT: 5 % (ref 3–12)
Monocytes Absolute: 0.8 10*3/uL (ref 0.1–1.0)
NEUTROS ABS: 11.9 10*3/uL — AB (ref 1.7–7.7)
Neutrophils Relative %: 76 % (ref 43–77)
PLATELETS: 437 10*3/uL — AB (ref 150–400)
RBC: 4.92 MIL/uL (ref 3.87–5.11)
RDW: 14.3 % (ref 11.5–15.5)
WBC: 15.5 10*3/uL — ABNORMAL HIGH (ref 4.0–10.5)

## 2014-03-29 MED ORDER — SODIUM CHLORIDE 0.9 % IV BOLUS (SEPSIS)
500.0000 mL | Freq: Once | INTRAVENOUS | Status: AC
  Administered 2014-03-29: 500 mL via INTRAVENOUS

## 2014-03-29 MED ORDER — ONDANSETRON HCL 4 MG/2ML IJ SOLN
4.0000 mg | Freq: Once | INTRAMUSCULAR | Status: AC
Start: 2014-03-29 — End: 2014-03-29
  Administered 2014-03-29: 4 mg via INTRAVENOUS
  Filled 2014-03-29: qty 2

## 2014-03-29 MED ORDER — SODIUM CHLORIDE 0.9 % IV BOLUS (SEPSIS)
1000.0000 mL | Freq: Once | INTRAVENOUS | Status: AC
  Administered 2014-03-29: 1000 mL via INTRAVENOUS

## 2014-03-29 MED ORDER — OXYCODONE HCL 5 MG PO TABS
30.0000 mg | ORAL_TABLET | Freq: Once | ORAL | Status: AC
  Administered 2014-03-29: 30 mg via ORAL
  Filled 2014-03-29: qty 6

## 2014-03-29 NOTE — ED Provider Notes (Signed)
CSN: [id number]     Arrival date & time 03/29/14  1030 History   First MD Initiated Contact with Patient 03/29/14 1036     Chief Complaint  Patient presents with  . Dizziness     (Consider location/radiation/quality/duration/timing/severity/associated sxs/prior Treatment) HPI  Patient presents to the ED with complaints of dizziness for the past month. She has a history of vertigo and has been seeing her PCP for this. She says that her PCP has her taking Meclizine which has helped but she still finds herself feeling dizzy. She fell due to a dizzy spell and injured her right knee, her PCP did an xray and reported that it was a normal xray. She still continues to feel like the inside of her knee is "ripping" and would like for it to be evaluated again. She also says that she had a head CT done this past Wednesday by her PCP because of dizziness and it was also normal. She requests a dose of her 30mg  Oxycodone because she last took a dose at 6 this morning and has been on it for years but without it her dizziness is worse and he knee pain becomes worse. She denies syncope spells, SOB, CP, n/v/d. She d enies any focal or generalized weakness and has not experienced any fevers, episodes of confusion, rashes.   Past Medical History  Diagnosis Date  . Cardiomyopathy     resolved  . Chest pain     chronicc  . Hyperlipidemia   . HTN (hypertension)   . Hypothyroidism   . Adrenal insufficiency     diagnosed 2012  . Anxiety   . Nondiabetic gastroparesis   . CAD (coronary artery disease)     Cath 2008 EF normal. RCA 50-60, Septal 50%. Myoview 3/12: EF 53% normal perfusion  . Chronic back pain   . QT prolongation   . Mitral valve prolapse   . Gastroparesis   . Chronic diarrhea   . Astigmatism   . Concussion     sept 28th 2014  . CHF (congestive heart failure)   . Cardiac arrest     2/2 adissonian crisis  . Tobacco abuse     down to 2 cigarettes per day   Past Surgical History  Procedure  Laterality Date  . Cholecystectomy    . Spine surgery    . Vesicovaginal fistula closure w/ tah    . Varicose vein surgery    . Abdominal hysterectomy    . Cardiac catheterization  03/2007    showed 60% lesion in the right coronary artery   Family History  Problem Relation Age of Onset  . Coronary artery disease    . Heart attack Mother   . Cancer Father     Colon   History  Substance Use Topics  . Smoking status: Current Every Day Smoker -- 0.50 packs/day for 10 years    Types: Cigarettes  . Smokeless tobacco: Never Used     Comment: smokes 6-7 cigarettes per day  . Alcohol Use: No   OB History   Grav Para Term Preterm Abortions TAB SAB Ect Mult Living   3 2 2  0 1  1   2      Review of Systems  Review of Systems  Gen: no weight loss, fevers, chills, night sweats  Eyes: no discharge or drainage, no occular pain or visual changes  Nose: no epistaxis or rhinorrhea  Mouth: no dental pain, no sore throat  Neck: no neck pain  Lungs:No  wheezing, coughing or hemoptysis CV: no chest pain, palpitations, dependent edema or orthopnea  Abd: no abdominal pain, nausea, vomiting, diarrhea GU: no dysuria or gross hematuria  MSK:  No muscle weakness, +right knee pain Neuro: no headache, no focal neurologic deficits +dizziness Skin: no rash or wounds Psyche: no complaints     Allergies  Bee venom; Penicillins; Cephalexin; Doxycycline; Dust mite extract; Erythromycin; Ibuprofen; Morphine and related; Sulfonamide derivatives; and Tramadol  Home Medications   Prior to Admission medications   Medication Sig Start Date End Date Taking? Authorizing Provider  amLODipine (NORVASC) 10 MG tablet Take 10 mg by mouth daily.   Yes Historical Provider, MD  aspirin 81 MG chewable tablet Chew 81 mg by mouth daily. Daily dose is 1 tab, takes 4 tabs if feeling chest pain or pressure   Yes Historical Provider, MD  cholecalciferol (VITAMIN D) 1000 UNITS tablet Take 1,000 Units by mouth daily.    Yes Historical Provider, MD  cyclobenzaprine (FLEXERIL) 10 MG tablet Take 10 mg by mouth as needed for muscle spasms.   Yes Historical Provider, MD  hydrocortisone (CORTEF) 10 MG tablet Take 10-20 mg by mouth 2 (two) times daily. Pt takes 20 mg in the morning and 10 mg at night   Yes Historical Provider, MD  levothyroxine (SYNTHROID, LEVOTHROID) 75 MCG tablet Take 75 mcg by mouth daily before breakfast.   Yes Historical Provider, MD  LORazepam (ATIVAN) 2 MG tablet Take 2 mg by mouth every 6 (six) hours as needed for anxiety.   Yes Historical Provider, MD  losartan (COZAAR) 100 MG tablet Take 100 mg by mouth daily.   Yes Historical Provider, MD  meclizine (ANTIVERT) 25 MG tablet Take 1 tablet (25 mg total) by mouth 4 (four) times daily as needed for dizziness. 06/15/13  Yes Janice Norrie, MD  metoCLOPramide (REGLAN) 10 MG tablet Take 20 mg by mouth 3 (three) times daily as needed. Runny stools   Yes Historical Provider, MD  nitroGLYCERIN (NITROSTAT) 0.4 MG SL tablet Place 0.4 mg under the tongue every 5 (five) minutes as needed for chest pain.   Yes Historical Provider, MD  omega-3 acid ethyl esters (LOVAZA) 1 G capsule Take 1 g by mouth 3 (three) times daily.   Yes Historical Provider, MD  ondansetron (ZOFRAN) 4 MG tablet Take 4-8 mg by mouth every 4 (four) hours as needed for nausea.    Yes Historical Provider, MD  oxycodone (ROXICODONE) 30 MG immediate release tablet Take 30 mg by mouth every 6 (six) hours as needed for pain.    Yes Historical Provider, MD  pramipexole (MIRAPEX) 0.125 MG tablet Take 0.125 mg by mouth 2 (two) times daily as needed (restless legs).  02/18/14  Yes Historical Provider, MD  zolpidem (AMBIEN) 10 MG tablet Take 10 mg by mouth at bedtime.  03/20/14  Yes Historical Provider, MD   BP 134/70  Pulse 84  Temp(Src) 97.7 F (36.5 C) (Oral)  Resp 16  SpO2 98% Physical Exam  Nursing note and vitals reviewed. Constitutional: She is oriented to person, place, and time. She appears  well-developed and well-nourished. No distress.  HENT:  Head: Normocephalic and atraumatic.  Eyes: Pupils are equal, round, and reactive to light.  Neck: Normal range of motion. Neck supple.  Cardiovascular: Normal rate and regular rhythm.   Pulmonary/Chest: Effort normal.  Abdominal: Soft.  Musculoskeletal:       Right knee: She exhibits decreased range of motion (due to pain). She exhibits no swelling, no effusion,  no ecchymosis, no deformity, no laceration, no erythema and normal alignment. Tenderness (diffuse) found.  No visible external abnormalities to knee  Neurological: She is alert and oriented to person, place, and time. She has normal strength. No cranial nerve deficit or sensory deficit. She displays a negative Romberg sign. GCS eye subscore is 4. GCS verbal subscore is 5. GCS motor subscore is 6.  Skin: Skin is warm and dry.    ED Course  Procedures (including critical care time) Labs Review Labs Reviewed - No data to display  Imaging Review Ct Head Wo Contrast  03/27/2014   CLINICAL DATA:  Dizziness, giddiness  EXAM: CT HEAD WITHOUT CONTRAST  TECHNIQUE: Contiguous axial images were obtained from the base of the skull through the vertex without intravenous contrast.  COMPARISON:  CT brain scan of 06/14/2013  FINDINGS: The ventricular system is stable in size and configuration, and the septum remains in a normal midline position. The fourth ventricle and basilar cisterns are unchanged. No hemorrhage, mass lesion, or acute infarction is seen. On bone window images, No calvarial abnormality is noted. The paranasal sinuses appear clear.  IMPRESSION: Negative unenhanced CT of the brain.   Electronically Signed   By: Ivar Drape M.D.   On: 03/27/2014 15:26     EKG Interpretation   Date/Time:  Friday March 29 2014 10:38:43 EDT Ventricular Rate:  85 PR Interval:  123 QRS Duration: 96 QT Interval:  385 QTC Calculation: 458 R Axis:   79 Text Interpretation:  Sinus rhythm Minimal  ST depression, inferior leads  Sinus rhythm T wave abnormality Borderline ECG Confirmed by Carmin Muskrat  MD ([id number]) on 03/29/2014 10:48:44 AM      MDM   Final diagnoses:  None    Patient admits that her symptoms improve with Antivert but is frustrated that they return. She had a fall recently due to her dizziness and has knee pain. She had already had a normal xray and head CT per patient but I repeated her knee xray which is normal. Blood work; cbc, bmp, and urinalysis was done and no gross abnormalities found.  She was given: Medications  sodium chloride 0.9 % bolus 1,000 mL (1,000 mLs Intravenous New Bag/Given 03/29/14 1239)  oxyCODONE (Oxy IR/ROXICODONE) immediate release tablet 30 mg (not administered)  ondansetron (ZOFRAN) injection 4 mg (4 mg Intravenous Given 03/29/14 1239)   12: 50pm In the ED for symptomatic relief. Orthostats negative. I believe that the patient would benefit from a neurology referral for her persistent vertigo. Fluids just now started, will re-eval   2: 05 pm Patient is feeling better, he pain and dizziness have resolved with rehydration and her pain medication with nausea medication. Will give referral to neurology to further follow-up as needed. Knee sleeve given for knee pain, hesitant to give knee immobilizer or crutches due to her dizziness and falls.  48 y.o.[name]'s evaluation in the Emergency Department is complete. It has been determined that no acute conditions requiring further emergency intervention are present at this time. The patient/guardian have been advised of the diagnosis and plan. We have discussed signs and symptoms that warrant return to the ED, such as changes or worsening in symptoms.  Vital signs are stable at discharge. Filed Vitals:   03/29/14 1345  BP:   Pulse:   Temp: 98 F (36.7 C)  Resp:     Patient/guardian has voiced understanding and agreed to follow-up with the PCP or specialist.     Linus Mako,  PA-C 03/29/14 1412

## 2014-03-29 NOTE — Discharge Instructions (Signed)
Dizziness °Dizziness is a common problem. It is a feeling of unsteadiness or light-headedness. You may feel like you are about to faint. Dizziness can lead to injury if you stumble or fall. A person of any age group can suffer from dizziness, but dizziness is more common in older adults. °CAUSES  °Dizziness can be caused by many different things, including: °· Middle ear problems. °· Standing for too long. °· Infections. °· An allergic reaction. °· Aging. °· An emotional response to something, such as the sight of blood. °· Side effects of medicines. °· Tiredness. °· Problems with circulation or blood pressure. °· Excessive use of alcohol or medicines, or illegal drug use. °· Breathing too fast (hyperventilation). °· An irregular heart rhythm (arrhythmia). °· A low red blood cell count (anemia). °· Pregnancy. °· Vomiting, diarrhea, fever, or other illnesses that cause body fluid loss (dehydration). °· Diseases or conditions such as Parkinson's disease, high blood pressure (hypertension), diabetes, and thyroid problems. °· Exposure to extreme heat. °DIAGNOSIS  °Your health care provider will ask about your symptoms, perform a physical exam, and perform an electrocardiogram (ECG) to record the electrical activity of your heart. Your health care provider may also perform other heart or blood tests to determine the cause of your dizziness. These may include: °· Transthoracic echocardiogram (TTE). During echocardiography, sound waves are used to evaluate how blood flows through your heart. °· Transesophageal echocardiogram (TEE). °· Cardiac monitoring. This allows your health care provider to monitor your heart rate and rhythm in real time. °· Holter monitor. This is a portable device that records your heartbeat and can help diagnose heart arrhythmias. It allows your health care provider to track your heart activity for several days if needed. °· Stress tests by exercise or by giving medicine that makes the heart beat  faster. °TREATMENT  °Treatment of dizziness depends on the cause of your symptoms and can vary greatly. °HOME CARE INSTRUCTIONS  °· Drink enough fluids to keep your urine clear or pale yellow. This is especially important in very hot weather. In older adults, it is also important in cold weather. °· Take your medicine exactly as directed if your dizziness is caused by medicines. When taking blood pressure medicines, it is especially important to get up slowly. °¨ Rise slowly from chairs and steady yourself until you feel okay. °¨ In the morning, first sit up on the side of the bed. When you feel okay, stand slowly while holding onto something until you know your balance is fine. °· Move your legs often if you need to stand in one place for a long time. Tighten and relax your muscles in your legs while standing. °· Have someone stay with you for 1-2 days if dizziness continues to be a problem. Do this until you feel you are well enough to stay alone. Have the person call your health care provider if he or she notices changes in you that are concerning. °· Do not drive or use heavy machinery if you feel dizzy. °· Do not drink alcohol. °SEEK IMMEDIATE MEDICAL CARE IF:  °· Your dizziness or light-headedness gets worse. °· You feel nauseous or vomit. °· You have problems talking, walking, or using your arms, hands, or legs. °· You feel weak. °· You are not thinking clearly or you have trouble forming sentences. It may take a friend or family member to notice this. °· You have chest pain, abdominal pain, shortness of breath, or sweating. °· Your vision changes. °· You notice   any bleeding.  You have side effects from medicine that seems to be getting worse rather than better. MAKE SURE YOU:   Understand these instructions.  Will watch your condition.  Will get help right away if you are not doing well or get worse. Document Released: 02/16/2001 Document Revised: 08/28/2013 Document Reviewed: 03/12/2011 [facility]  Patient Information 2015 [place], [place]. This information is not intended to replace advice given to you by your health care provider. Make sure you discuss any questions you have with your health care provider.  Knee Bracing Knee braces are supports to help stabilize and protect an injured or painful knee. They come in many different styles. They should support and protect the knee without increasing the chance of other injuries to yourself or others. It is important not to have a false sense of security when using a brace. Knee braces that help you to keep using your knee:  Do not restore normal knee stability under high stress forces.  May decrease some aspects of athletic performance. Some of the different types of knee braces are:  Prophylactic knee braces are designed to prevent or reduce the severity of knee injuries during sports that make injury to the knee more likely.  Rehabilitative knee braces are designed to allow protected motion of:  Injured knees.  Knees that have been treated with or without surgery. There is no evidence that the use of a supportive knee brace protects the graft following a successful anterior cruciate ligament (ACL) reconstruction. However, braces are sometimes used to:   Protect injured ligaments.  Control knee movement during the initial healing period. They may be used as part of the treatment program for the various injured ligaments or cartilage of the knee including the:  Anterior cruciate ligament.  Medial collateral ligament.  Medial or lateral cartilage (meniscus).  Posterior cruciate ligament.  Lateral collateral ligament. Rehabilitative knee braces are most commonly used:  During crutch-assisted walking right after injury.  During crutch-assisted walking right after surgery to repair the cartilage and/or cruciate ligament injury.  For a short period of time, 2-8 weeks, after the injury or surgery. The value of a rehabilitative  brace as opposed to a cast or splint includes the:  Ability to adjust the brace for swelling.  Ability to remove the brace for examinations, icing, or showering.  Ability to allow for movement in a controlled range of motion. Functional knee braces give support to knees that have already been injured. They are designed to provide stability for the injured knee and provide protection after repair. Functional knee braces may not affect performance much. Lower extremity muscle strengthening, flexibility, and improvement in technique are more important than bracing in treating ligamentous knee injuries. Functional braces are not a substitute for rehabilitation or surgical procedures. Unloader/off-loader braces are designed to provide pain relief in arthritic knees. Patients with wear and tear arthritis from growing old or from an old cartilage injury (osteoarthritis) of the knee, and bowlegged (varus) or knock-knee (valgus) deformities, often develop increased pain in the arthritic side due to increased loading. Unloader/off-loader braces are made to reduce uneven loading in such knees. There is reduction in bowing out movement in bowlegged knees when the correct unloader brace is used. Patients with advanced osteoarthritis or severe varus or valgus alignment problems would not likely benefit from bracing. Patellofemoral braces help the kneecap to move smoothly and well centered over the end of the femur in the knee.  Most people who wear knee braces feel that they  help. However, there is a lack of scientific evidence that knee braces are helpful at the level needed for athletic participation to prevent injury. In spite of this, athletes report an increase in knee stability, pain relief, performance improvement, and confidence during athletics when using a brace.  Different knee problems require different knee braces:  Your caregiver may suggest one kind of knee brace after knee surgery.  A caregiver may  choose another kind of knee brace for support instead of surgery for some types of torn ligaments.  You may also need one for pain in the front of your knee that is not getting better with strengthening and flexibility exercises. Get your caregiver's advice if you want to try a knee brace. The caregiver will advise you on where to get them and provide a prescription when it is needed to fashion and/or fit the brace. Knee braces are the least important part of preventing knee injuries or getting better following injury. Stretching, strengthening and technique improvement are far more important in caring for and preventing knee injuries. When strengthening your knee, increase your activities a little at a time so as not to develop injuries from overuse. Work out an exercise plan with your caregiver and/or physical therapist to get the best program for you. Do not let a knee brace become a crutch. Always remember, there are no braces which support the knee as well as your original ligaments and cartilage you were born with. Conditioning, proper warm-up, and stretching remain the most important parts of keeping your knees healthy. HOW TO USE A KNEE BRACE  During sports, knee braces should be used as directed by your caregiver.  Make sure that the hinges are where the knee bends.  Straps, tapes, or hook-and-loop tapes should be fastened around your leg as instructed.  You should check the placement of the brace during activities to make sure that it has not moved. Poorly positioned braces can hurt rather than help you.  To work well, a knee brace should be worn during all activities that put you at risk of knee injury.  Warm up properly before beginning athletic activities. HOME CARE INSTRUCTIONS  Knee braces often get damaged during normal use. Replace worn-out braces for maximum benefit.  Clean regularly with soap and water.  Inspect your brace often for wear and tear.  Cover exposed metal to  protect others from injury.  Durable materials may cost more, but last longer. SEEK IMMEDIATE MEDICAL CARE IF:   Your knee seems to be getting worse rather than better.  You have increasing pain or swelling in the knee.  You have problems caused by the knee brace.  You have increased swelling or inflammation (redness or soreness) in your knee.  Your knee becomes warm and more painful and you develop an unexplained temperature over 101F (38.3C). MAKE SURE YOU:   Understand these instructions.  Will watch your condition.  Will get help right away if you are not doing well or get worse. See your caregiver, physical therapist, or orthopedic surgeon for additional information. Document Released: 11/13/2003 Document Revised: 01/07/2014 Document Reviewed: 02/19/2009 [facility] Patient Information 2015 [place], [place]. This information is not intended to replace advice given to you by your health care provider. Make sure you discuss any questions you have with your health care provider.

## 2014-03-29 NOTE — ED Notes (Signed)
Pt states she thinks "the plate in the back of her head a slipped and is causing her pain,numbness and dizziness."  She also c/o pain in her right knee where she feel on it last week.  Xray did not show fracture.  She states if she tries to bend it feels like "its ripping tearing and burning."  Pt also states she has a knot in her right forearm that is moving up her arm.

## 2014-03-29 NOTE — ED Provider Notes (Signed)
  Medical screening examination/treatment/procedure(s) were performed by non-physician practitioner and as supervising physician I was immediately available for consultation/collaboration.   EKG Interpretation   Date/Time:  Friday March 29 2014 10:38:43 EDT Ventricular Rate:  85 PR Interval:  123 QRS Duration: 96 QT Interval:  385 QTC Calculation: 458 R Axis:   79 Text Interpretation:  Sinus rhythm Minimal ST depression, inferior leads  Sinus rhythm T wave abnormality Borderline ECG Confirmed by Carmin Muskrat  MD ([id number]) on 03/29/2014 10:48:44 AM         Carmin Muskrat, MD 03/29/14 [phone]

## 2014-03-29 NOTE — ED Notes (Signed)
Pt states that she has had ongoing dizziness for over 1 month. Also reports N/V. Pt was given meclazine with no relief. Pt states that she has had multiple falls since. Pt states that her rt knee was injured when she fell and states that her knee feels like it is ripping. Pt state that her rt arm has a "knot" in it as well.

## 2014-03-29 NOTE — ED Notes (Signed)
CBG 118 

## 2014-04-10 IMAGING — CR DG CHEST 2V
2 series · 2 of 2 positions shown · non-contrast
Comparison: 06/15/2013

CLINICAL DATA: Left chest pain, cough

EXAM:
CHEST  2 VIEW

[w chest pa]
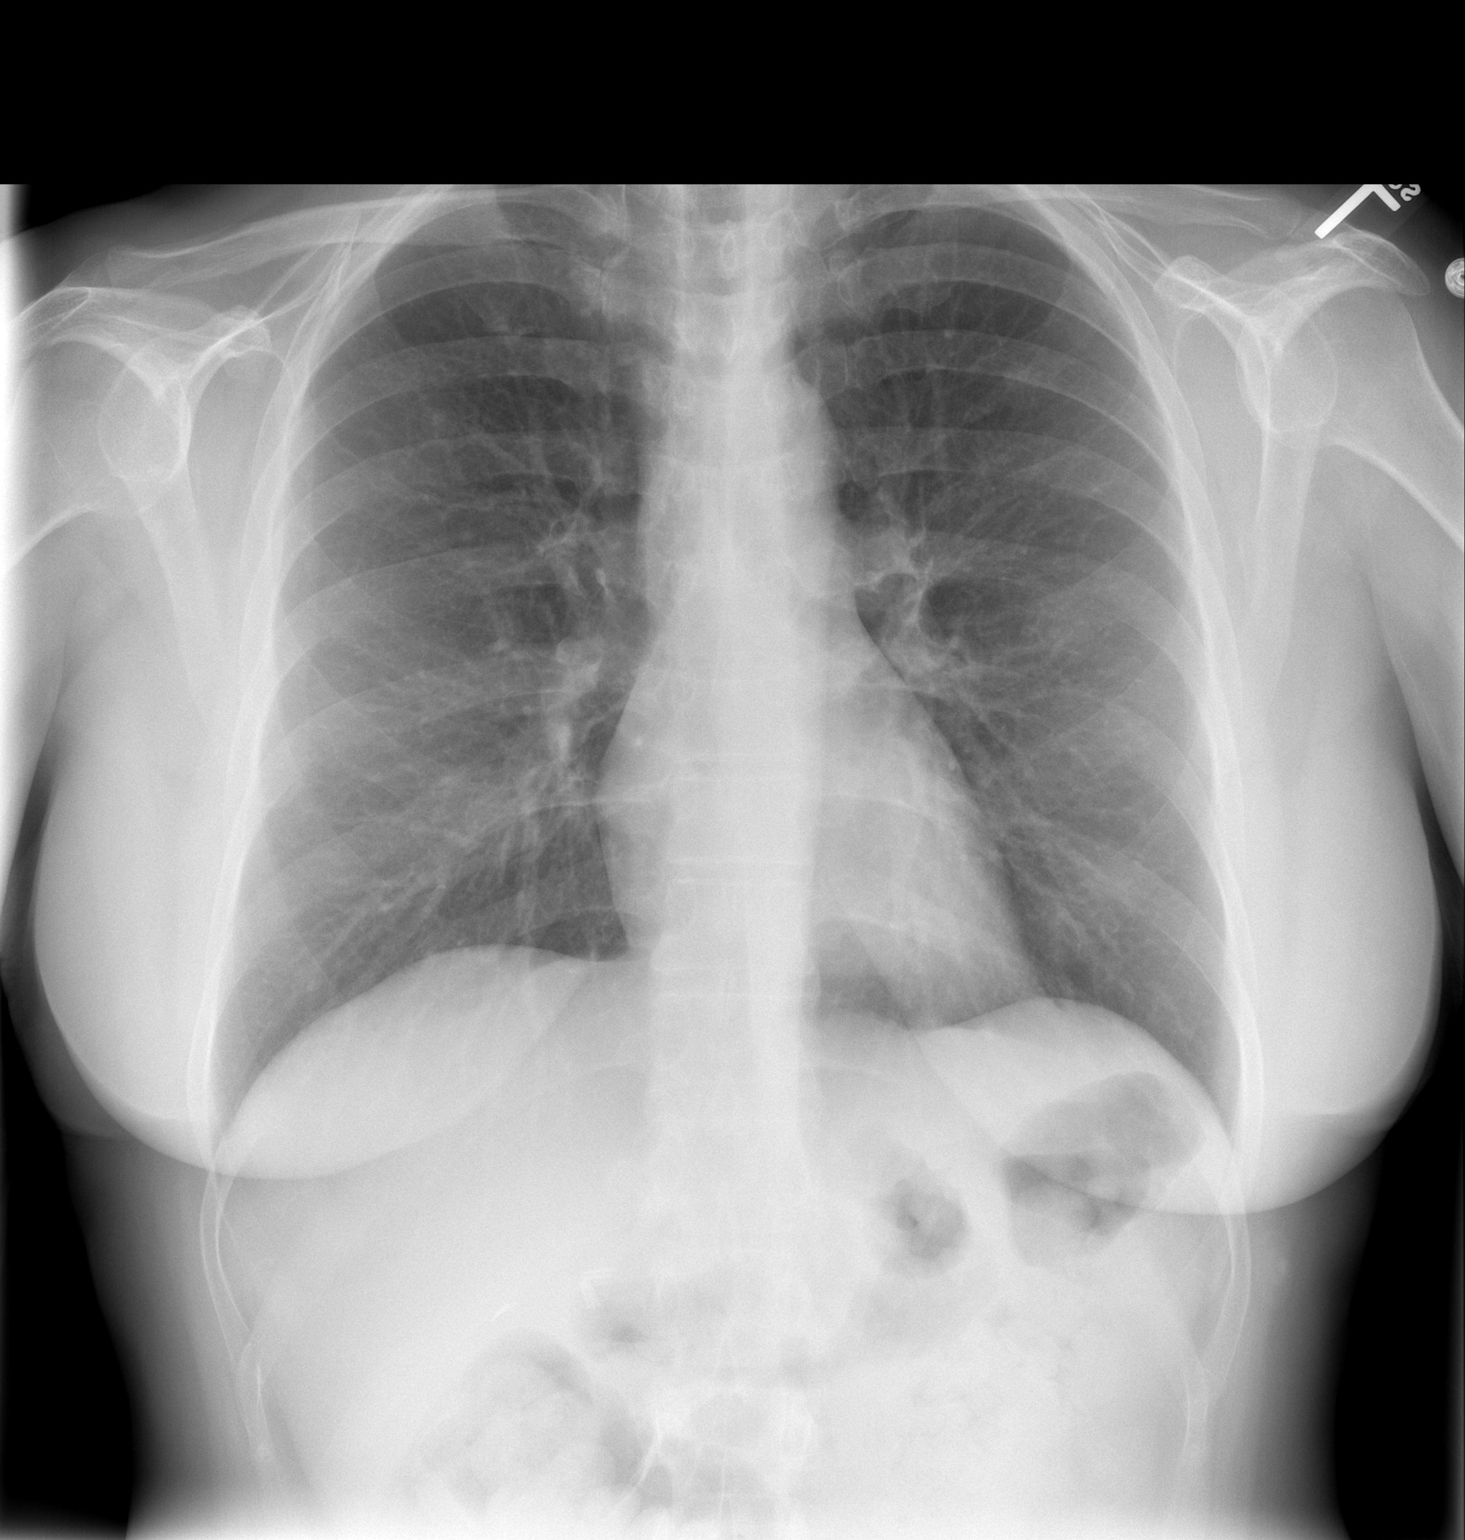

[w chest lat]
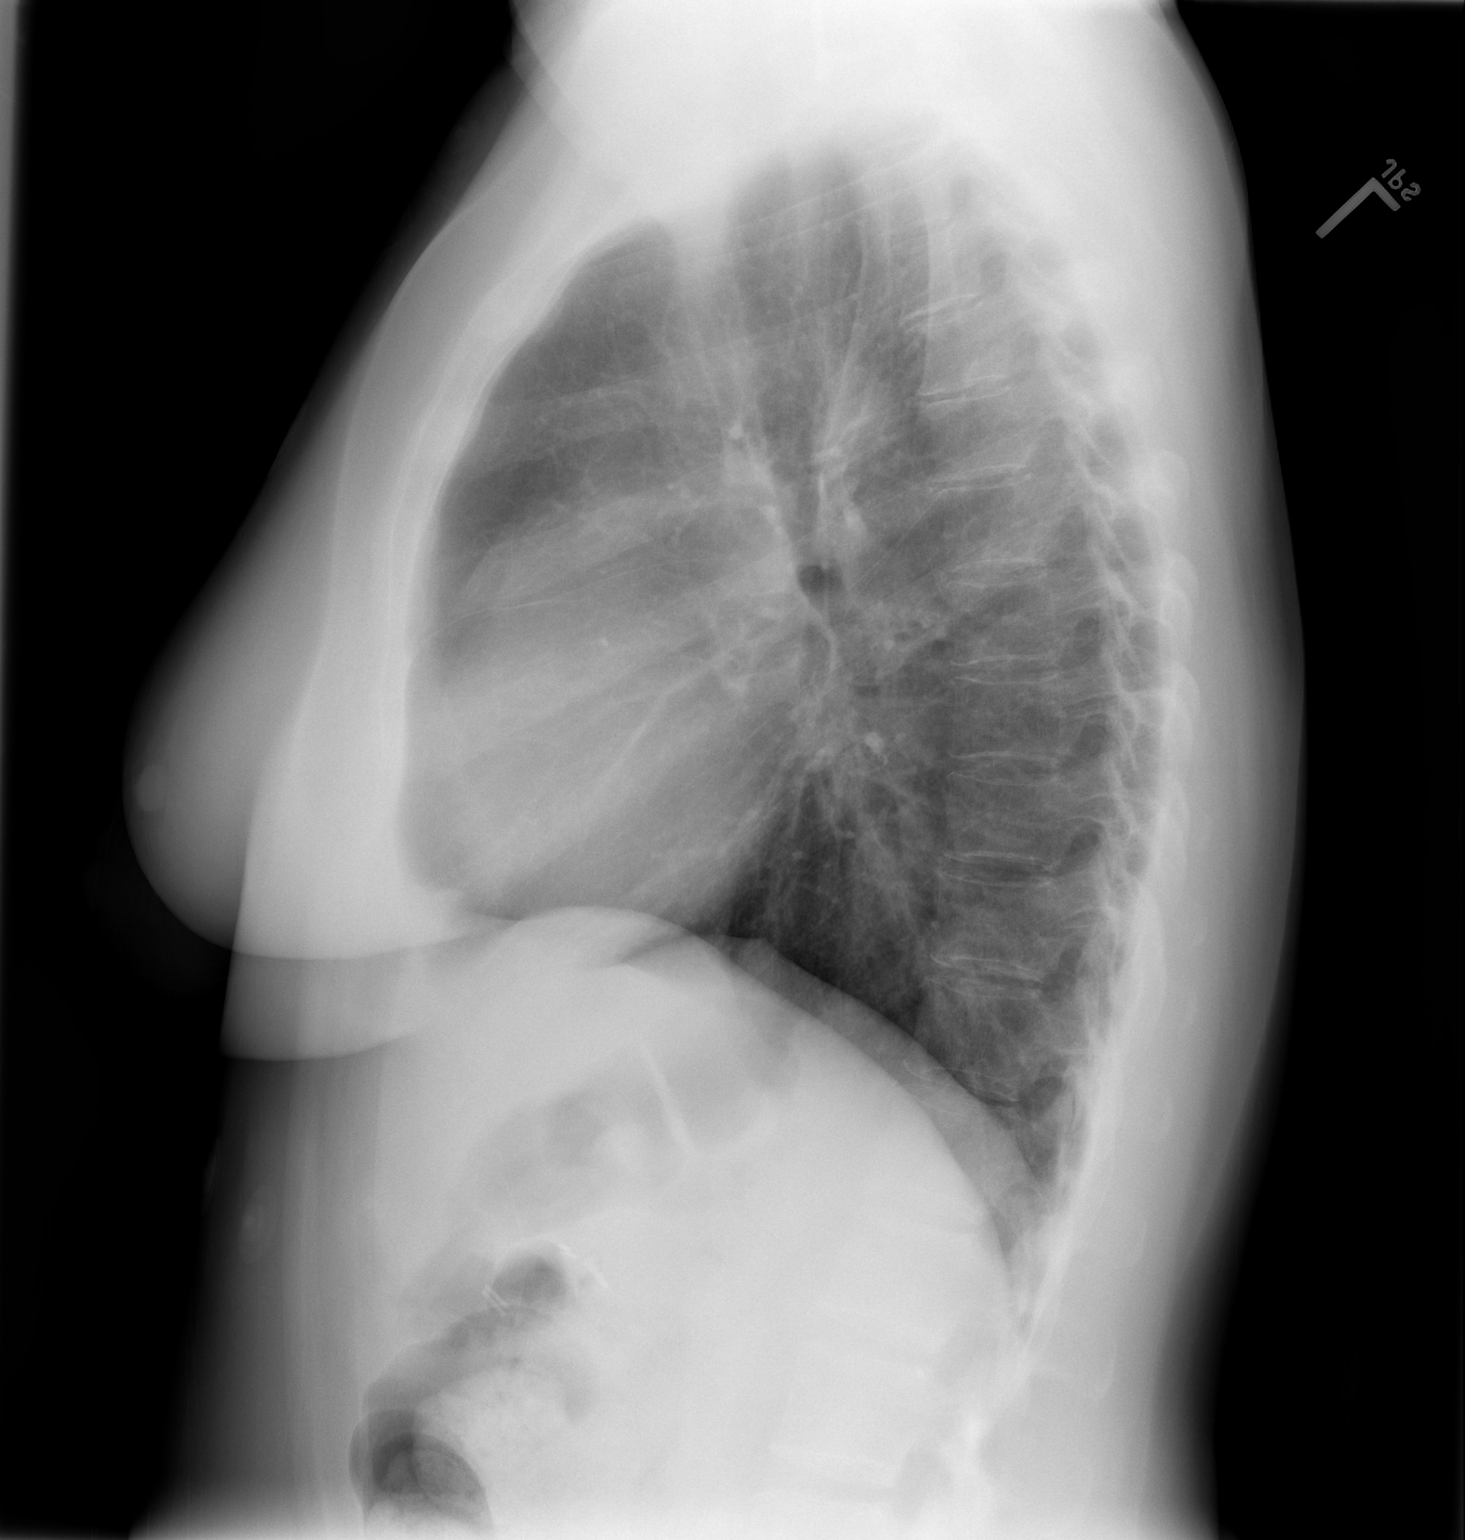

[2 of 2 positions shown; findings below may reference images not displayed]

FINDINGS: The heart size and mediastinal contours are within normal limits.
Both lungs are clear. The visualized skeletal structures are
unremarkable. Prior cholecystectomy noted. Lower cervical fusion
hardware present.
IMPRESSION: No active cardiopulmonary disease.  Stable exam.

## 2014-04-16 IMAGING — CR DG CHEST 2V
2 series · 2 of 2 positions shown · non-contrast
Comparison: 07/30/2013

CLINICAL DATA: Chest pain, nausea, headache.

EXAM:
CHEST - 2 VIEW

[w chest pa]
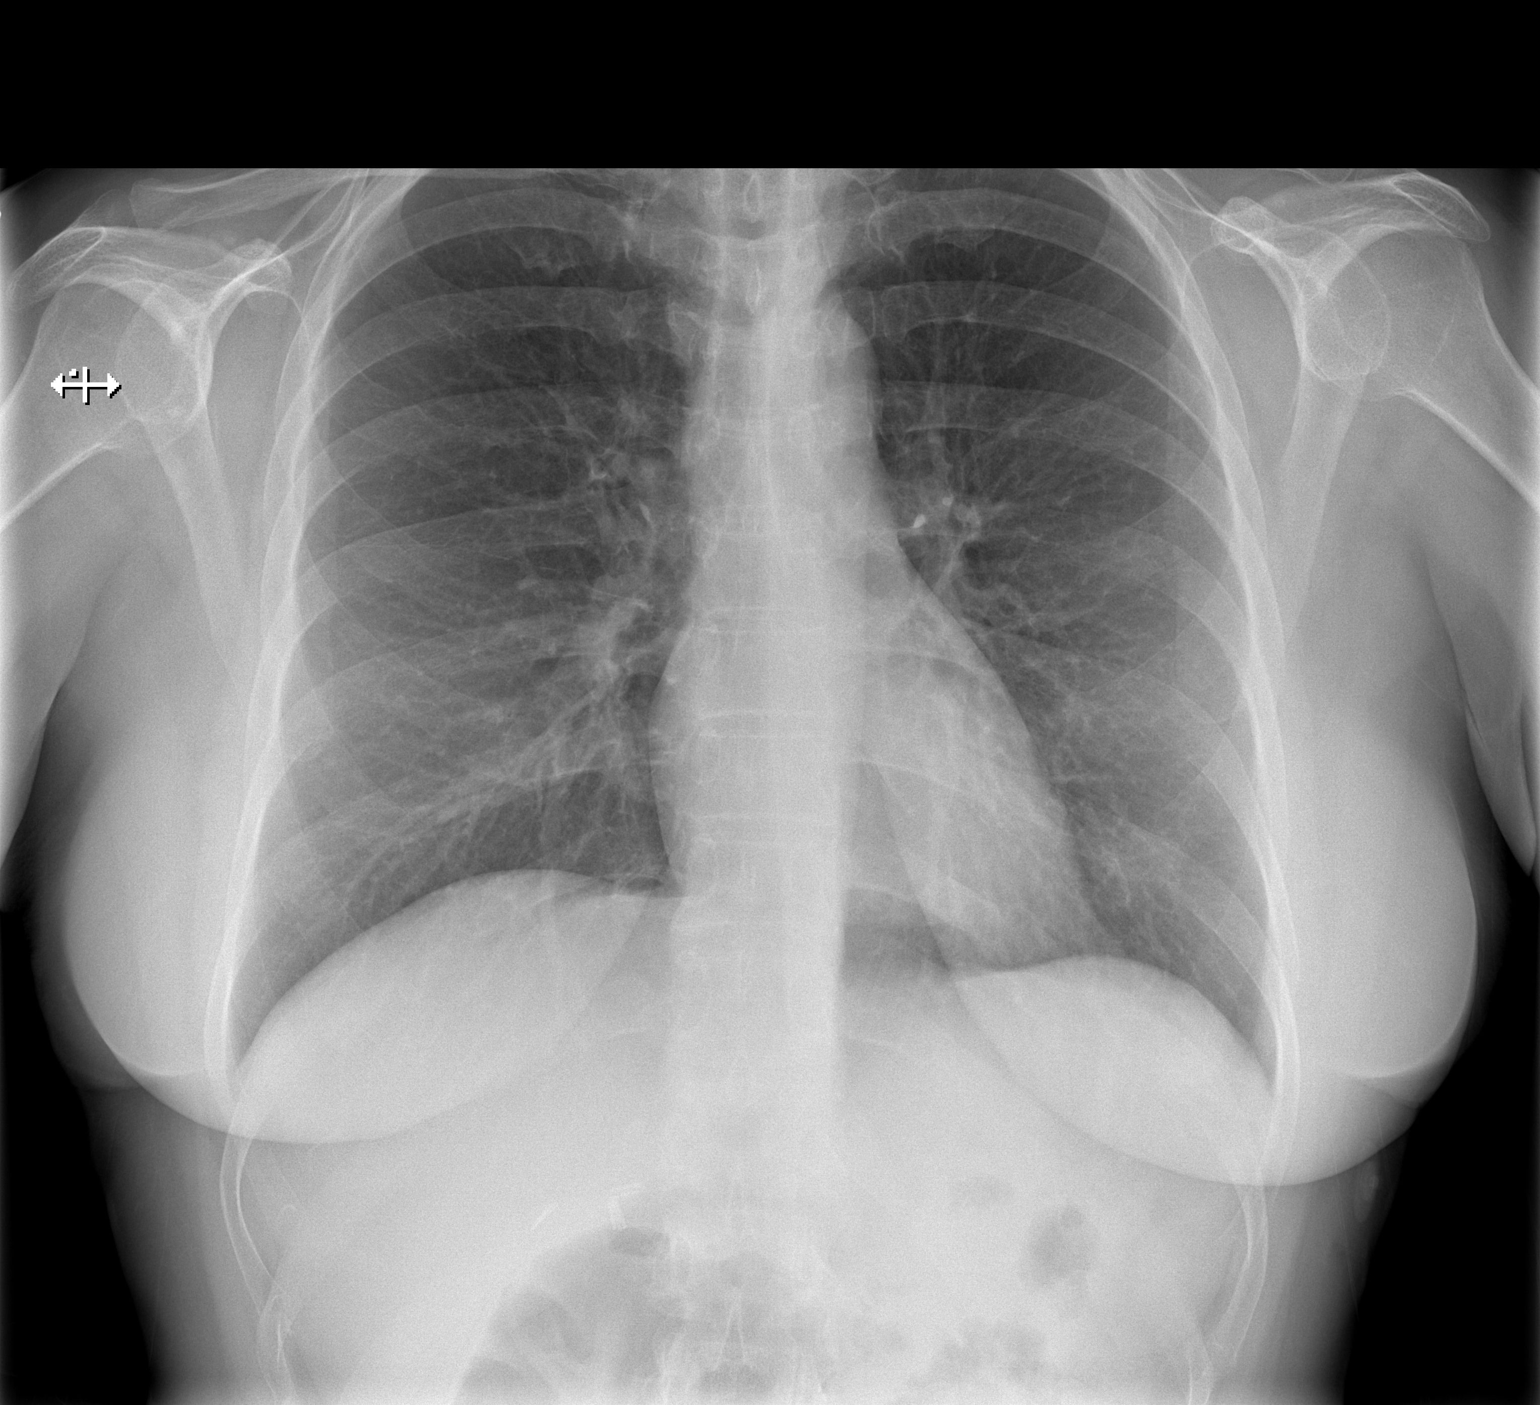

[w chest lat]
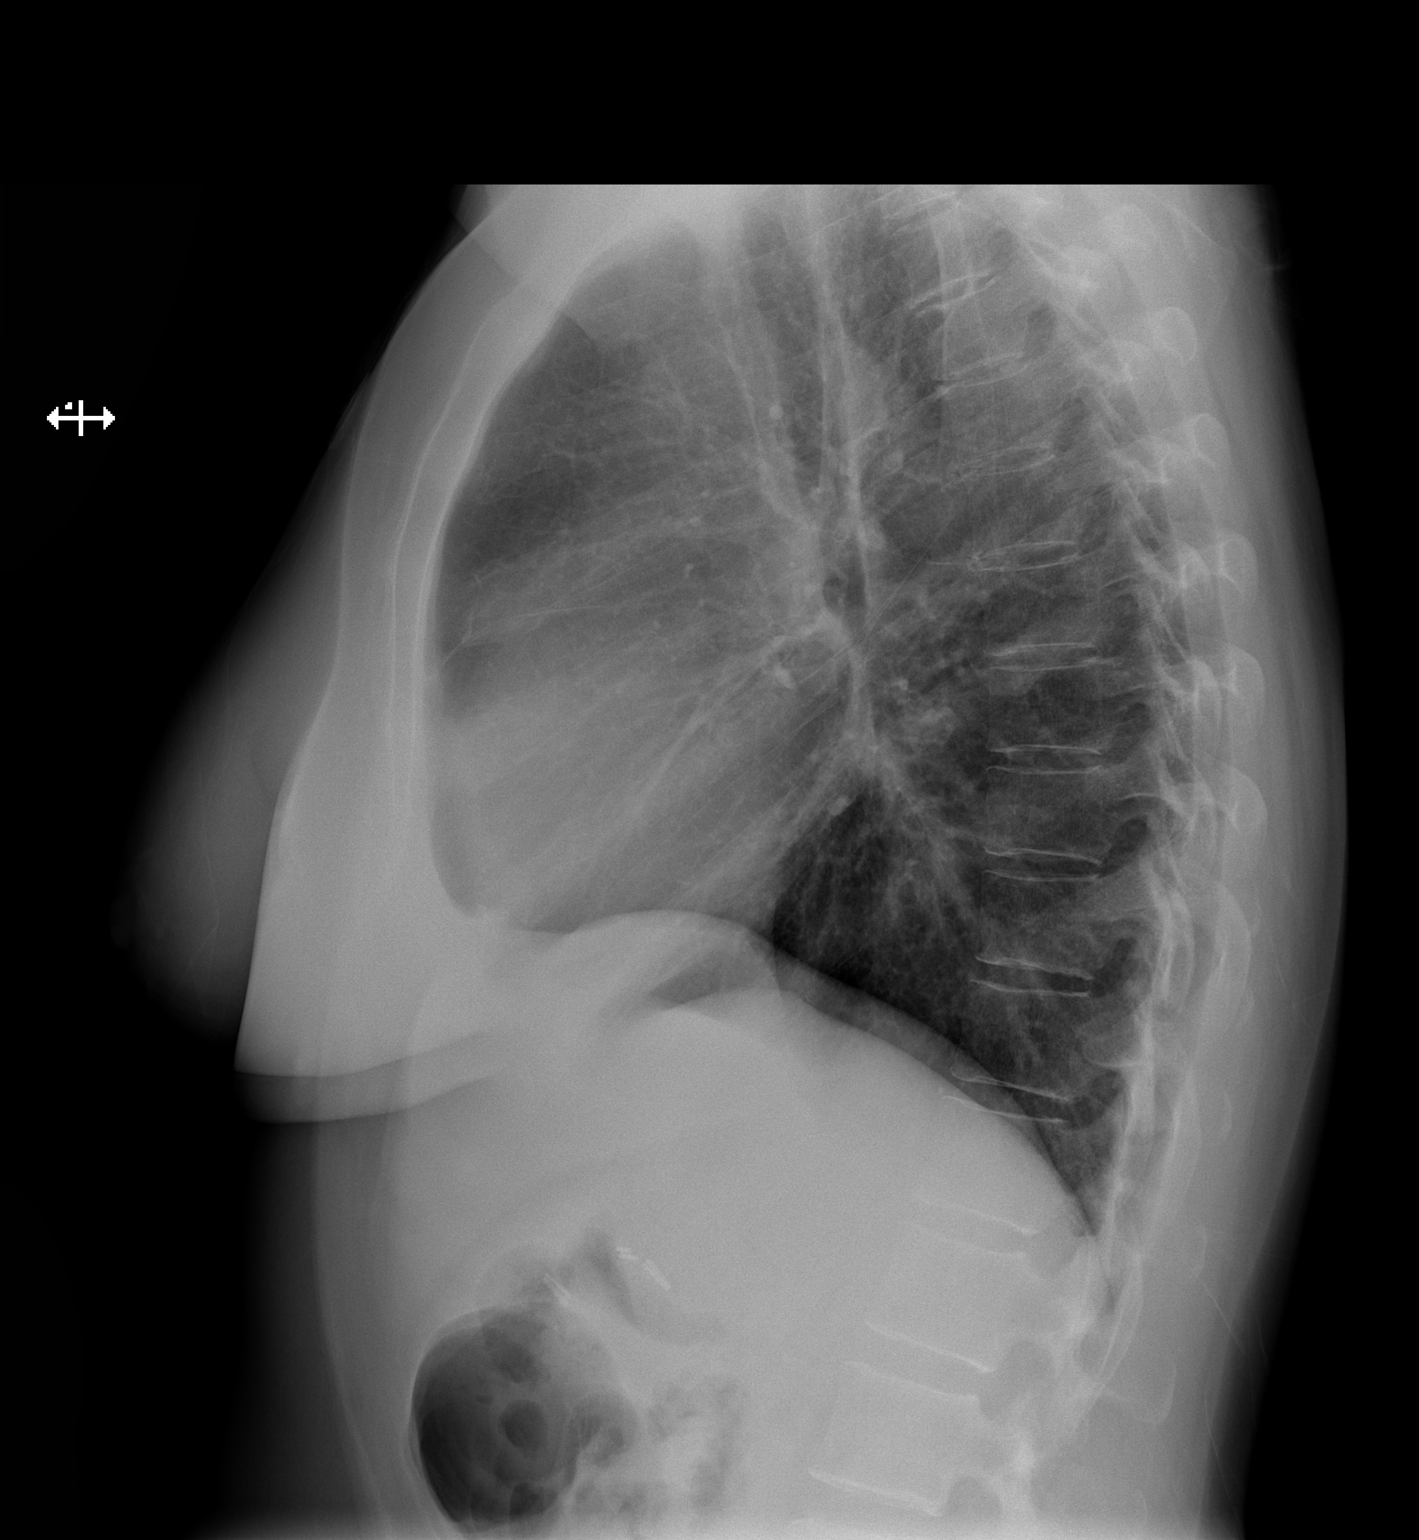

[2 of 2 positions shown; findings below may reference images not displayed]

FINDINGS: The heart size and mediastinal contours are within normal limits.
Both lungs are clear. The visualized skeletal structures are
unremarkable. No effusion. Cervical fixation hardware partially
seen. Vascular clips in the right upper abdomen.
IMPRESSION: No acute cardiopulmonary disease.

## 2014-04-17 ENCOUNTER — Other Ambulatory Visit (HOSPITAL_COMMUNITY): Payer: Self-pay | Admitting: Internal Medicine

## 2014-04-17 DIAGNOSIS — G8929 Other chronic pain: Secondary | ICD-10-CM | POA: Diagnosis not present

## 2014-04-17 DIAGNOSIS — I10 Essential (primary) hypertension: Secondary | ICD-10-CM | POA: Diagnosis not present

## 2014-04-17 DIAGNOSIS — Z1231 Encounter for screening mammogram for malignant neoplasm of breast: Secondary | ICD-10-CM

## 2014-04-17 DIAGNOSIS — Z0000 Encounter for general adult medical examination without abnormal findings: Secondary | ICD-10-CM

## 2014-04-17 DIAGNOSIS — E2749 Other adrenocortical insufficiency: Secondary | ICD-10-CM | POA: Diagnosis not present

## 2014-04-17 DIAGNOSIS — R55 Syncope and collapse: Secondary | ICD-10-CM | POA: Diagnosis not present

## 2014-04-22 ENCOUNTER — Ambulatory Visit (HOSPITAL_COMMUNITY)
Admission: RE | Admit: 2014-04-22 | Discharge: 2014-04-22 | Disposition: A | Discharge status: Home / Self Care | Payer: Medicare Other | Source: Ambulatory Visit | Attending: Internal Medicine | Admitting: Internal Medicine

## 2014-04-22 DIAGNOSIS — Z1231 Encounter for screening mammogram for malignant neoplasm of breast: Secondary | ICD-10-CM

## 2014-05-04 IMAGING — CR DG CHEST 2V
2 series · 2 of 2 positions shown · non-contrast
Comparison: 08/05/2013

CLINICAL DATA: Headache, cough, chest pain, shortness of breath,
history hypertension, coronary artery disease, cardiomyopathy, CHF,
smoking

EXAM:
CHEST  2 VIEW

[w chest pa]
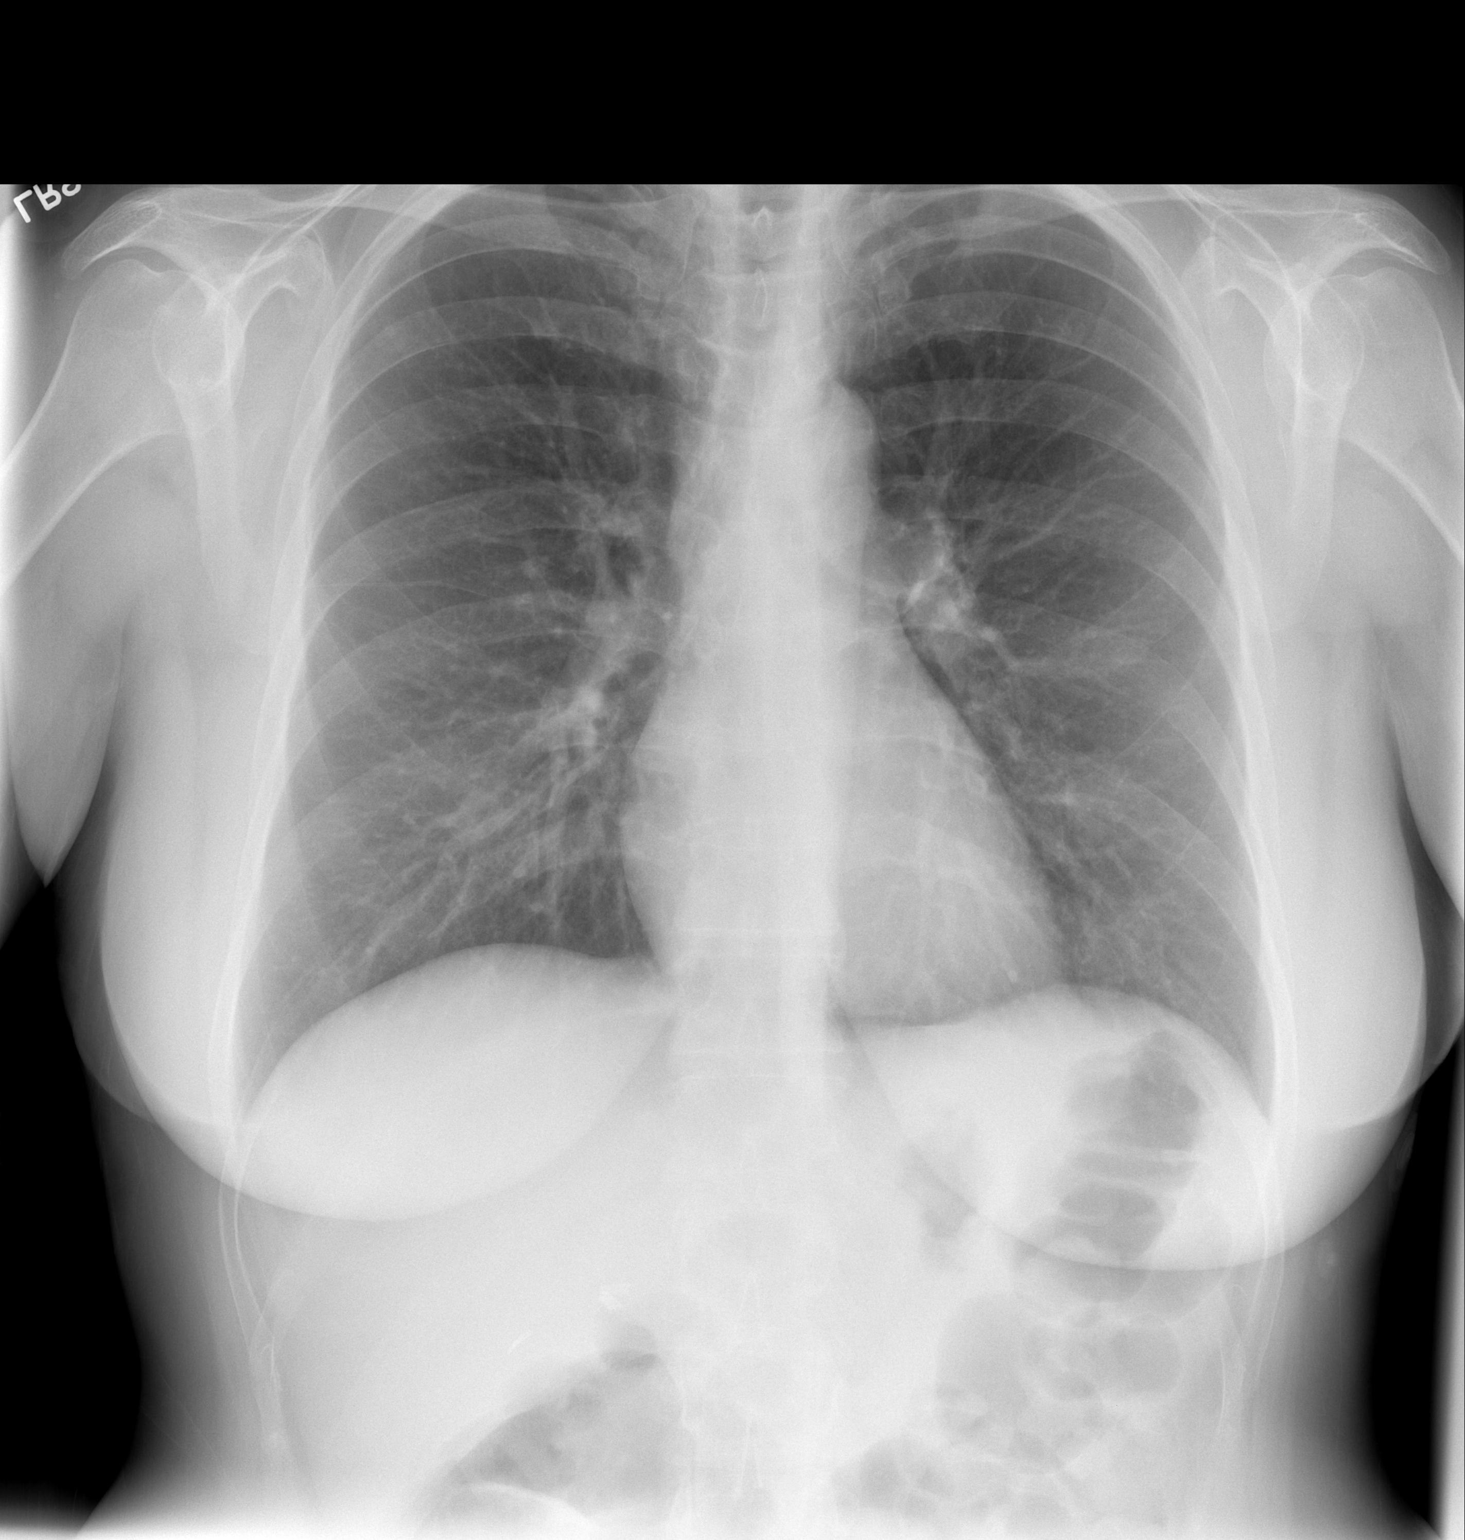

[w chest lat]
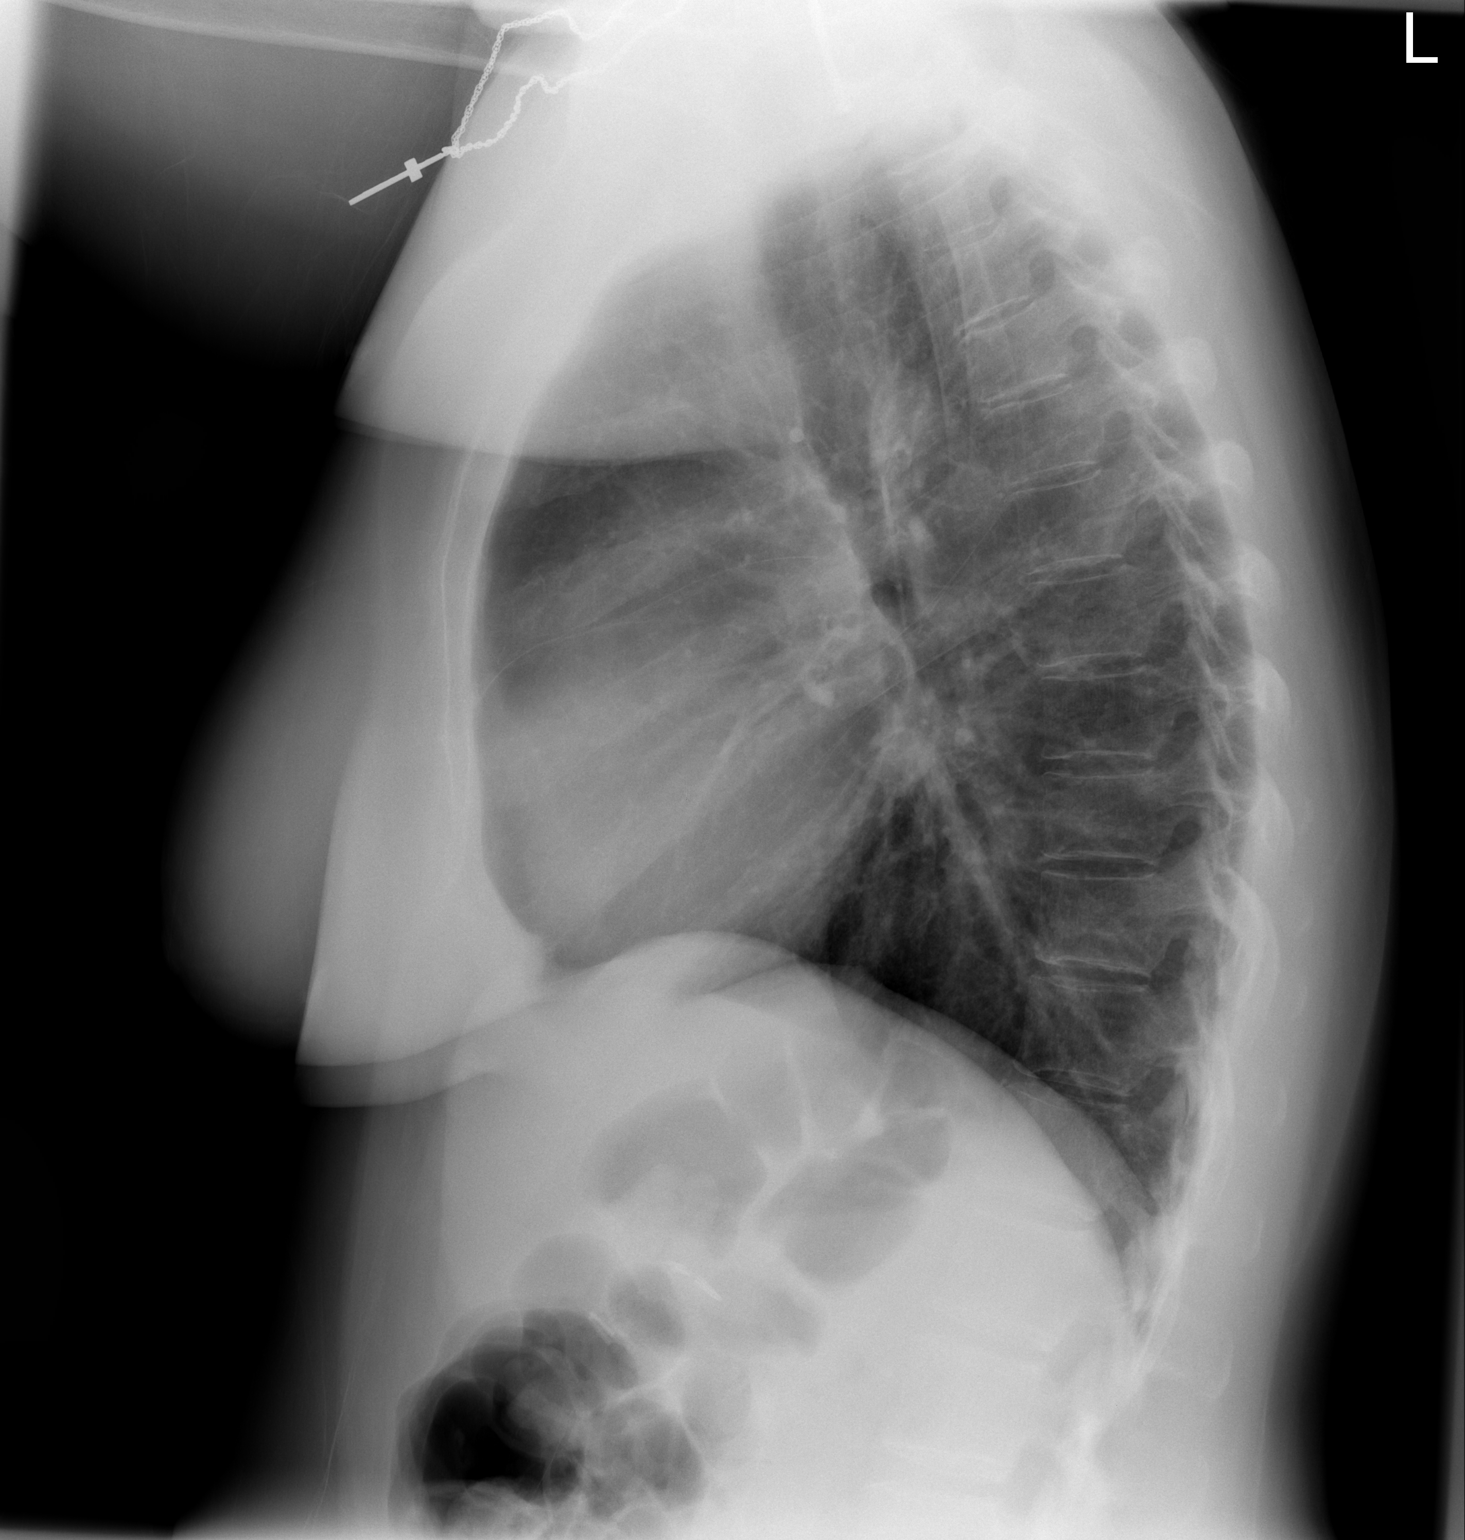

[2 of 2 positions shown; findings below may reference images not displayed]

FINDINGS: Normal heart size, mediastinal contours, and pulmonary vascularity.

Lungs clear.

No pleural effusion or pneumothorax.

Prior cervical spine fusion.
IMPRESSION: No acute abnormalities.

## 2014-05-05 IMAGING — CT CT MAXILLOFACIAL W/ CM
3 series · 16 of 47 positions shown, 19 images · IV contrast (omnipaque)
Comparison: Head CT 06/14/2013 and maxillofacial CT 02/03/2013

CLINICAL DATA: Left jaw pain, facial pain, and trismus. Began
yesterday with concomitant headache and chest pain.

EXAM:
CT MAXILLOFACIAL WITH CONTRAST
TECHNIQUE: Multidetector CT imaging of the maxillofacial structures was
performed with intravenous contrast. Multiplanar CT image
reconstructions were also generated. A small metallic BB was placed
on the right temple in order to reliably differentiate right from
left.
CONTRAST:  75mL OMNIPAQUE IOHEXOL 300 MG/ML  SOLN

[Series 4: facial/ orbits 2.0 h30s · axial · 0.32mm/px · z∈[-193,-67]mm · 10 of 75 slices shown, 13 images]
[im 6/75  brain]
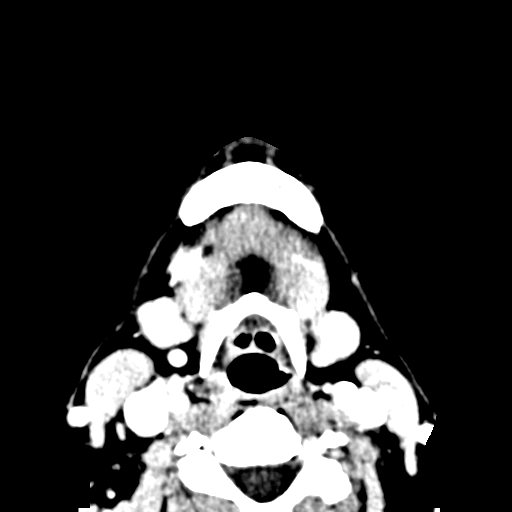
[im 6/75  bone]
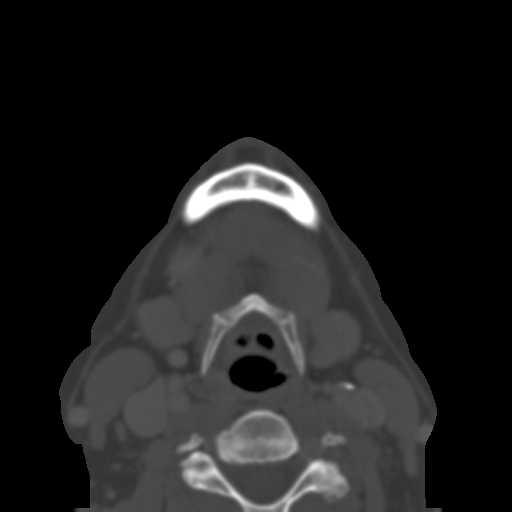
[im 13/75  bone]
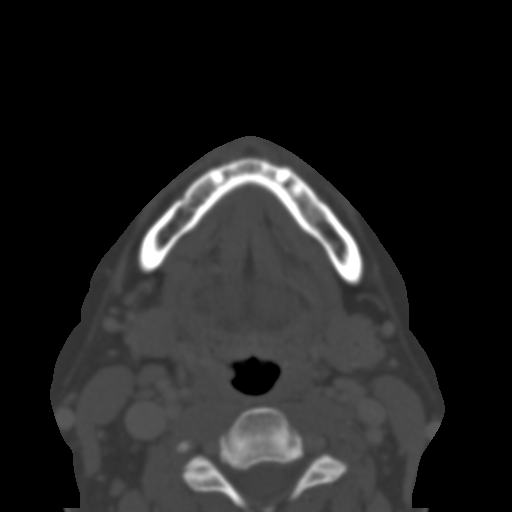
[im 21/75  bone]
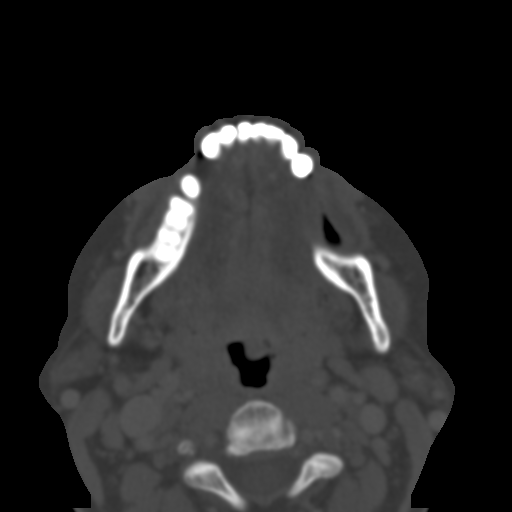
[im 26/75  bone]
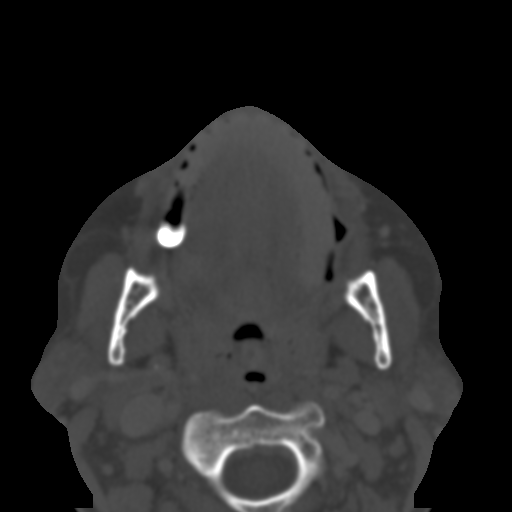
[im 34/75  brain]
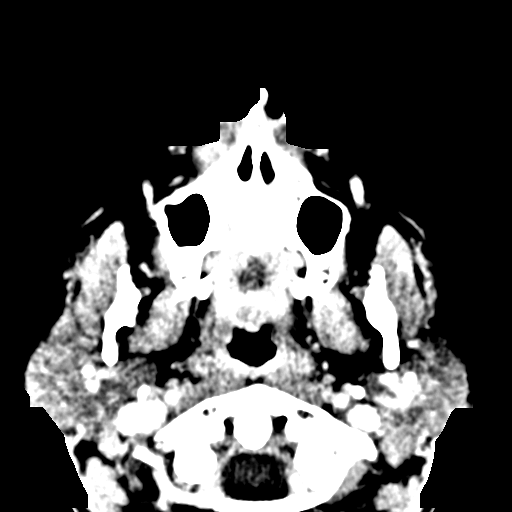
[im 34/75  bone]
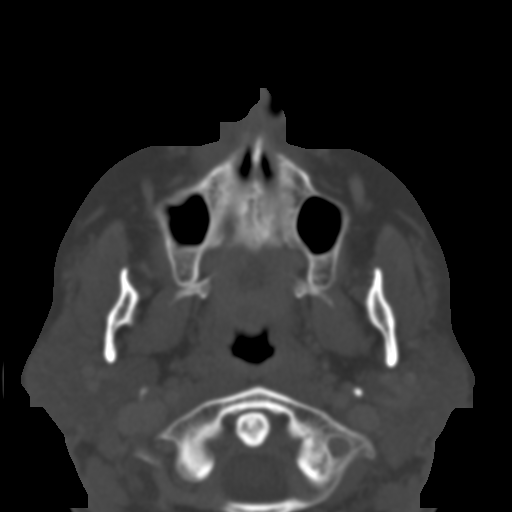
[im 41/75  bone]
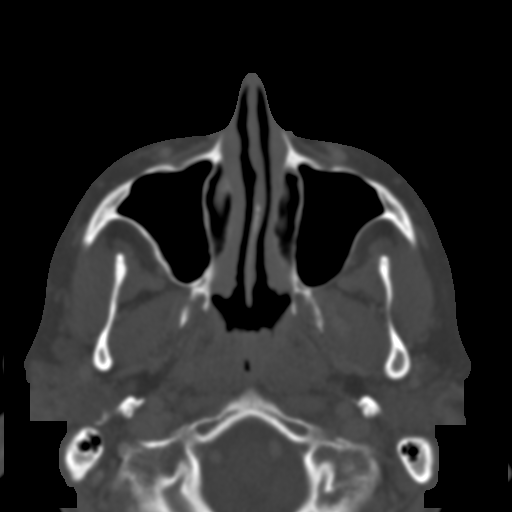
[im 49/75  bone]
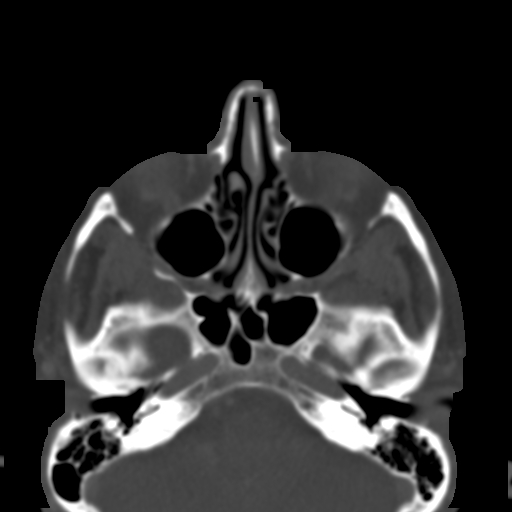
[im 57/75  bone]
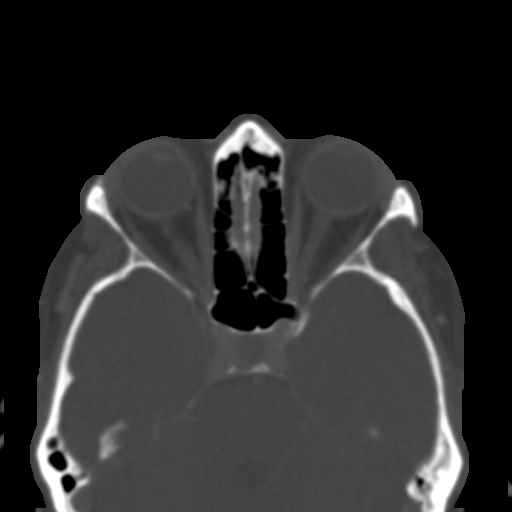
[im 62/75  brain]
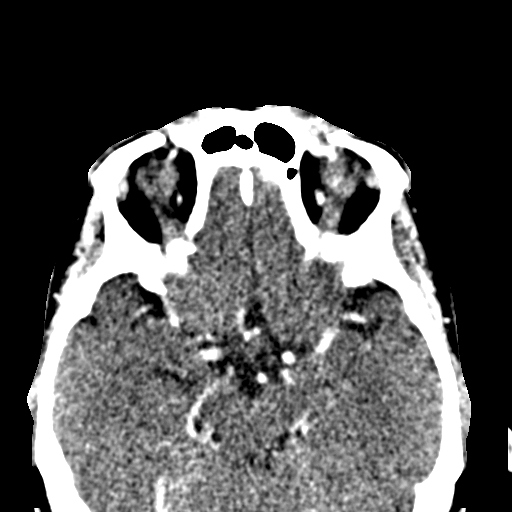
[im 62/75  bone]
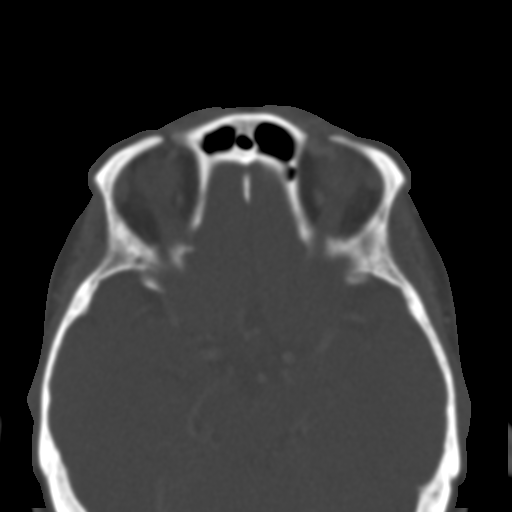
[im 69/75  bone]
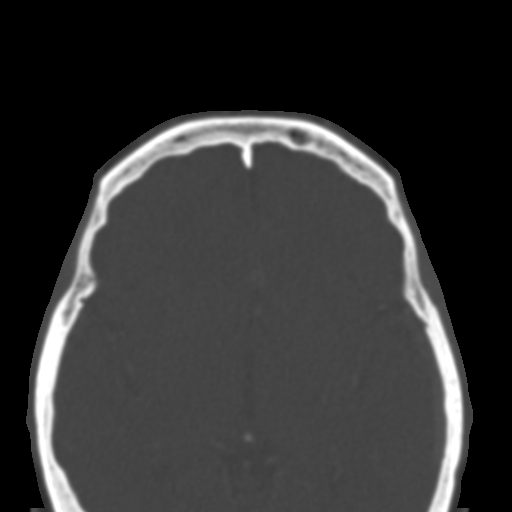

[Series 5: coronal st · coronal · 0.29mm/px · 3 of 72 slices shown]
[im 24/72  bone]
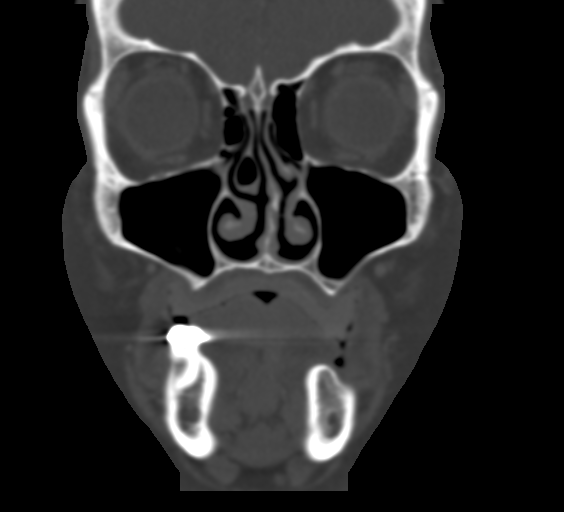
[im 32/72  bone]
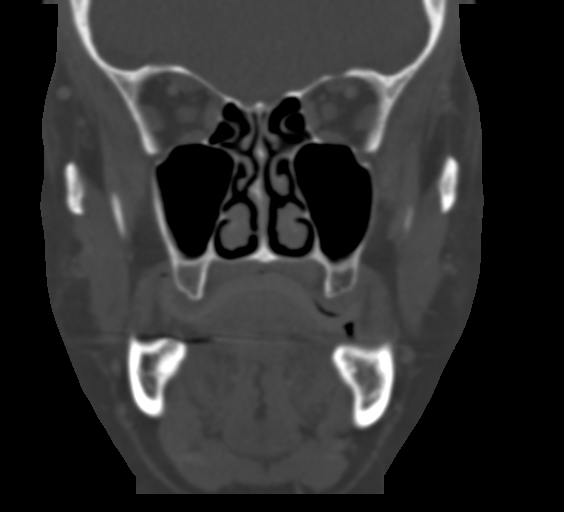
[im 40/72  bone]
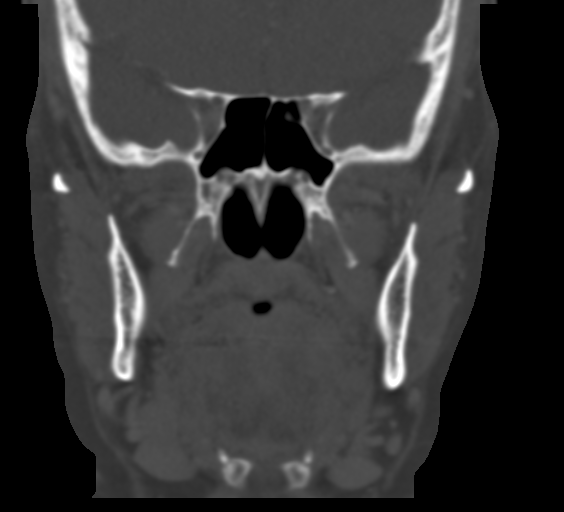

[Series 7: sagittal st · sagittal · 0.29mm/px · 3 of 76 slices shown]
[im 26/76  bone]
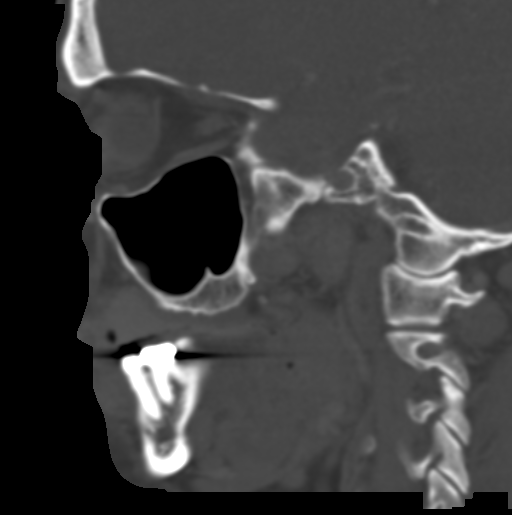
[im 38/76  bone]
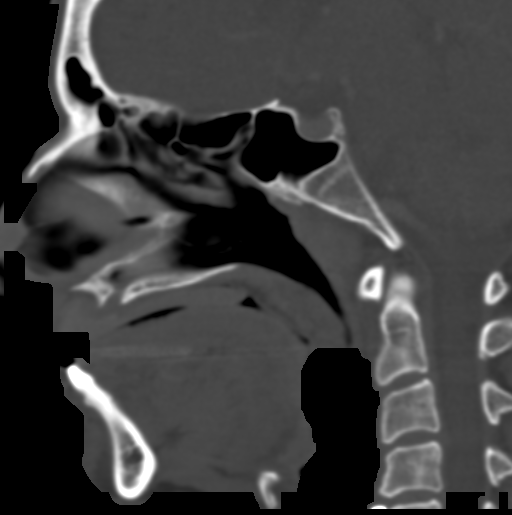
[im 51/76  bone]
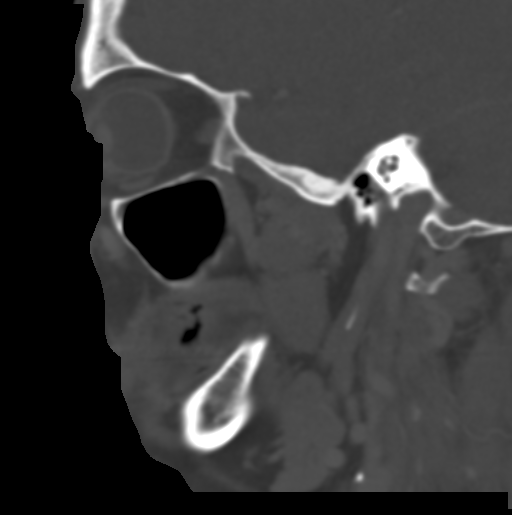

[16 of 47 positions shown; findings below may reference images not displayed]

FINDINGS: The visualized portion of the brain is unremarkable. Orbits are
normal. Submandibular and parotid glands are unremarkable. A small
mucous retention cyst is noted in the right maxillary sinus.
Paranasal sinuses are otherwise clear. Mastoid air cells are clear.
Nasopharynx and oropharynx are unremarkable. Multiple mandibular
teeth are absent, particularly on the left side. All maxillary teeth
have been removed. Moderate right TMJ osteoarthrosis appears
unchanged. There is leftward nasal septal deviation, unchanged. No
acute maxillofacial fracture is identified. No soft tissue masses
identified.
IMPRESSION: 1. No acute maxillofacial abnormality identified.
2. Unchanged right TMJ osteoarthrosis.

## 2014-05-07 DIAGNOSIS — E039 Hypothyroidism, unspecified: Secondary | ICD-10-CM | POA: Diagnosis not present

## 2014-05-07 DIAGNOSIS — E2749 Other adrenocortical insufficiency: Secondary | ICD-10-CM | POA: Diagnosis not present

## 2014-05-07 DIAGNOSIS — R630 Anorexia: Secondary | ICD-10-CM | POA: Diagnosis not present

## 2014-05-07 DIAGNOSIS — R7309 Other abnormal glucose: Secondary | ICD-10-CM | POA: Diagnosis not present

## 2014-05-10 ENCOUNTER — Encounter (HOSPITAL_COMMUNITY): Payer: Self-pay | Admitting: Emergency Medicine

## 2014-05-10 DIAGNOSIS — Z7982 Long term (current) use of aspirin: Secondary | ICD-10-CM | POA: Insufficient documentation

## 2014-05-10 DIAGNOSIS — E785 Hyperlipidemia, unspecified: Secondary | ICD-10-CM | POA: Diagnosis not present

## 2014-05-10 DIAGNOSIS — Z79899 Other long term (current) drug therapy: Secondary | ICD-10-CM | POA: Insufficient documentation

## 2014-05-10 DIAGNOSIS — Z87828 Personal history of other (healed) physical injury and trauma: Secondary | ICD-10-CM | POA: Diagnosis not present

## 2014-05-10 DIAGNOSIS — Z9889 Other specified postprocedural states: Secondary | ICD-10-CM | POA: Insufficient documentation

## 2014-05-10 DIAGNOSIS — Z8669 Personal history of other diseases of the nervous system and sense organs: Secondary | ICD-10-CM | POA: Insufficient documentation

## 2014-05-10 DIAGNOSIS — F17200 Nicotine dependence, unspecified, uncomplicated: Secondary | ICD-10-CM | POA: Insufficient documentation

## 2014-05-10 DIAGNOSIS — E039 Hypothyroidism, unspecified: Secondary | ICD-10-CM | POA: Insufficient documentation

## 2014-05-10 DIAGNOSIS — E2749 Other adrenocortical insufficiency: Secondary | ICD-10-CM | POA: Diagnosis not present

## 2014-05-10 DIAGNOSIS — G8929 Other chronic pain: Secondary | ICD-10-CM | POA: Diagnosis not present

## 2014-05-10 DIAGNOSIS — Z8674 Personal history of sudden cardiac arrest: Secondary | ICD-10-CM | POA: Insufficient documentation

## 2014-05-10 DIAGNOSIS — Z9089 Acquired absence of other organs: Secondary | ICD-10-CM | POA: Insufficient documentation

## 2014-05-10 DIAGNOSIS — M1990 Unspecified osteoarthritis, unspecified site: Secondary | ICD-10-CM | POA: Diagnosis not present

## 2014-05-10 DIAGNOSIS — IMO0002 Reserved for concepts with insufficient information to code with codable children: Secondary | ICD-10-CM | POA: Diagnosis not present

## 2014-05-10 DIAGNOSIS — R51 Headache: Secondary | ICD-10-CM | POA: Diagnosis not present

## 2014-05-10 DIAGNOSIS — Z880 Allergy status to penicillin: Secondary | ICD-10-CM | POA: Insufficient documentation

## 2014-05-10 DIAGNOSIS — I2510 Atherosclerotic heart disease of native coronary artery without angina pectoris: Secondary | ICD-10-CM | POA: Insufficient documentation

## 2014-05-10 DIAGNOSIS — Z6830 Body mass index (BMI) 30.0-30.9, adult: Secondary | ICD-10-CM | POA: Diagnosis not present

## 2014-05-10 DIAGNOSIS — F411 Generalized anxiety disorder: Secondary | ICD-10-CM | POA: Insufficient documentation

## 2014-05-10 DIAGNOSIS — R110 Nausea: Secondary | ICD-10-CM | POA: Insufficient documentation

## 2014-05-10 DIAGNOSIS — Z90710 Acquired absence of both cervix and uterus: Secondary | ICD-10-CM | POA: Insufficient documentation

## 2014-05-10 DIAGNOSIS — I10 Essential (primary) hypertension: Secondary | ICD-10-CM | POA: Diagnosis not present

## 2014-05-10 DIAGNOSIS — R112 Nausea with vomiting, unspecified: Secondary | ICD-10-CM | POA: Diagnosis not present

## 2014-05-10 DIAGNOSIS — R1031 Right lower quadrant pain: Secondary | ICD-10-CM | POA: Insufficient documentation

## 2014-05-10 DIAGNOSIS — I509 Heart failure, unspecified: Secondary | ICD-10-CM | POA: Insufficient documentation

## 2014-05-10 LAB — CBC WITH DIFFERENTIAL/PLATELET
Basophils Absolute: 0.1 10*3/uL (ref 0.0–0.1)
Basophils Relative: 1 % (ref 0–1)
EOS ABS: 0.2 10*3/uL (ref 0.0–0.7)
Eosinophils Relative: 2 % (ref 0–5)
HCT: 42.8 % (ref 36.0–46.0)
Hemoglobin: 14.4 g/dL (ref 12.0–15.0)
LYMPHS ABS: 3.8 10*3/uL (ref 0.7–4.0)
Lymphocytes Relative: 36 % (ref 12–46)
MCH: 29.1 pg (ref 26.0–34.0)
MCHC: 33.6 g/dL (ref 30.0–36.0)
MCV: 86.6 fL (ref 78.0–100.0)
MONOS PCT: 7 % (ref 3–12)
Monocytes Absolute: 0.7 10*3/uL (ref 0.1–1.0)
NEUTROS PCT: 54 % (ref 43–77)
Neutro Abs: 5.7 10*3/uL (ref 1.7–7.7)
Platelets: 363 10*3/uL (ref 150–400)
RBC: 4.94 MIL/uL (ref 3.87–5.11)
RDW: 14.9 % (ref 11.5–15.5)
WBC: 10.5 10*3/uL (ref 4.0–10.5)

## 2014-05-10 LAB — URINALYSIS, ROUTINE W REFLEX MICROSCOPIC
BILIRUBIN URINE: NEGATIVE
Glucose, UA: NEGATIVE mg/dL
Hgb urine dipstick: NEGATIVE
Ketones, ur: NEGATIVE mg/dL
Leukocytes, UA: NEGATIVE
Nitrite: NEGATIVE
PROTEIN: NEGATIVE mg/dL
Specific Gravity, Urine: 1.018 (ref 1.005–1.030)
UROBILINOGEN UA: 0.2 mg/dL (ref 0.0–1.0)
pH: 5.5 (ref 5.0–8.0)

## 2014-05-10 NOTE — ED Notes (Signed)
The pt is c/o abd pain with a headache and nv.  She saw her doctor earlier today and was given a shot.  She had lab drawn on Wednesday when she was seen by her other doctor in the office.

## 2014-05-11 ENCOUNTER — Emergency Department (HOSPITAL_COMMUNITY): Payer: Medicare Other

## 2014-05-11 ENCOUNTER — Encounter (HOSPITAL_COMMUNITY): Payer: Self-pay | Admitting: *Deleted

## 2014-05-11 ENCOUNTER — Emergency Department (HOSPITAL_COMMUNITY)
Admission: EM | Admit: 2014-05-11 | Discharge: 2014-05-11 | Disposition: A | Discharge status: Home / Self Care | Payer: Medicare Other | Attending: Emergency Medicine | Admitting: Emergency Medicine

## 2014-05-11 DIAGNOSIS — R1031 Right lower quadrant pain: Secondary | ICD-10-CM

## 2014-05-11 DIAGNOSIS — R112 Nausea with vomiting, unspecified: Secondary | ICD-10-CM | POA: Diagnosis not present

## 2014-05-11 LAB — COMPREHENSIVE METABOLIC PANEL
ALK PHOS: 88 U/L (ref 39–117)
ALT: 9 U/L (ref 0–35)
ANION GAP: 13 (ref 5–15)
AST: 19 U/L (ref 0–37)
Albumin: 3.7 g/dL (ref 3.5–5.2)
BUN: 9 mg/dL (ref 6–23)
CHLORIDE: 102 meq/L (ref 96–112)
CO2: 27 mEq/L (ref 19–32)
CREATININE: 0.86 mg/dL (ref 0.50–1.10)
Calcium: 9.2 mg/dL (ref 8.4–10.5)
GFR calc non Af Amer: 77 mL/min — ABNORMAL LOW (ref >90)
GFR, EST AFRICAN AMERICAN: 90 mL/min — AB (ref >90)
GLUCOSE: 94 mg/dL (ref 70–99)
POTASSIUM: 3.6 meq/L — AB (ref 3.7–5.3)
Sodium: 142 mEq/L (ref 137–147)
TOTAL PROTEIN: 7.5 g/dL (ref 6.0–8.3)

## 2014-05-11 LAB — LIPASE, BLOOD: Lipase: 20 U/L (ref 11–59)

## 2014-05-11 MED ORDER — IOHEXOL 300 MG/ML  SOLN
25.0000 mL | Freq: Once | INTRAMUSCULAR | Status: AC | PRN
  Administered 2014-05-11: 25 mL via ORAL

## 2014-05-11 MED ORDER — HYDROMORPHONE HCL PF 1 MG/ML IJ SOLN
0.5000 mg | Freq: Once | INTRAMUSCULAR | Status: AC
  Administered 2014-05-11: 0.5 mg via INTRAVENOUS
  Filled 2014-05-11: qty 1

## 2014-05-11 MED ORDER — ONDANSETRON HCL 4 MG/2ML IJ SOLN
4.0000 mg | Freq: Once | INTRAMUSCULAR | Status: AC
Start: 2014-05-11 — End: 2014-05-11
  Administered 2014-05-11: 4 mg via INTRAVENOUS
  Filled 2014-05-11: qty 2

## 2014-05-11 MED ORDER — SODIUM CHLORIDE 0.9 % IV BOLUS (SEPSIS)
500.0000 mL | Freq: Once | INTRAVENOUS | Status: AC
  Administered 2014-05-11: 500 mL via INTRAVENOUS

## 2014-05-11 MED ORDER — IOHEXOL 300 MG/ML  SOLN
100.0000 mL | Freq: Once | INTRAMUSCULAR | Status: AC | PRN
  Administered 2014-05-11: 100 mL via INTRAVENOUS

## 2014-05-11 MED ORDER — FENTANYL CITRATE 0.05 MG/ML IJ SOLN
75.0000 ug | Freq: Once | INTRAMUSCULAR | Status: AC
  Administered 2014-05-11: 75 ug via INTRAVENOUS
  Filled 2014-05-11: qty 2

## 2014-05-11 NOTE — ED Notes (Signed)
Patient transported to CT 

## 2014-05-11 NOTE — ED Provider Notes (Signed)
CSN: [id number]     Arrival date & time 05/10/14  2241 History   First MD Initiated Contact with Patient 05/11/14 0125     Chief Complaint  Patient presents with  . Abdominal Pain     (Consider location/radiation/quality/duration/timing/severity/associated sxs/prior Treatment) HPI Comments: 48 year old female with lipids, obesity, high blood pressure, Addison's, CAD presents with right lower quadrant pain since this evening. Patient had her gallbladder removed in the past. Pain is constant with mild nausea. Patient never had this in the past and still has her appendix. No focal radiation, no fevers or vomiting. Pain is a constant ache.  Patient is a 48 y.o. female presenting with abdominal pain. The history is provided by the patient.  Abdominal Pain Associated symptoms: nausea   Associated symptoms: no chest pain, no chills, no dysuria, no fever, no shortness of breath and no vomiting     Past Medical History  Diagnosis Date  . Cardiomyopathy     resolved  . Chest pain     chronicc  . Hyperlipidemia   . HTN (hypertension)   . Hypothyroidism   . Adrenal insufficiency     diagnosed 2012  . Anxiety   . Nondiabetic gastroparesis   . CAD (coronary artery disease)     Cath 2008 EF normal. RCA 50-60, Septal 50%. Myoview 3/12: EF 53% normal perfusion  . Chronic back pain   . QT prolongation   . Mitral valve prolapse   . Gastroparesis   . Chronic diarrhea   . Astigmatism   . Concussion     sept 28th 2014  . CHF (congestive heart failure)   . Cardiac arrest     2/2 adissonian crisis  . Tobacco abuse     down to 2 cigarettes per day   Past Surgical History  Procedure Laterality Date  . Cholecystectomy    . Spine surgery    . Vesicovaginal fistula closure w/ tah    . Varicose vein surgery    . Abdominal hysterectomy    . Cardiac catheterization  03/2007    showed 60% lesion in the right coronary artery   Family History  Problem Relation Age of Onset  . Coronary artery  disease    . Heart attack Mother   . Cancer Father     Colon   History  Substance Use Topics  . Smoking status: Current Every Day Smoker -- 0.50 packs/day for 10 years    Types: Cigarettes  . Smokeless tobacco: Never Used     Comment: smokes 6-7 cigarettes per day  . Alcohol Use: No   OB History   Grav Para Term Preterm Abortions TAB SAB Ect Mult Living   3 2 2  0 1  1   2      Review of Systems  Constitutional: Negative for fever and chills.  HENT: Negative for congestion.   Eyes: Negative for visual disturbance.  Respiratory: Negative for shortness of breath.   Cardiovascular: Negative for chest pain.  Gastrointestinal: Positive for nausea and abdominal pain. Negative for vomiting.  Genitourinary: Negative for dysuria and flank pain.  Musculoskeletal: Negative for back pain, neck pain and neck stiffness.  Skin: Negative for rash.  Neurological: Positive for headaches. Negative for light-headedness.      Allergies  Bee venom; Penicillins; Cephalexin; Doxycycline; Dust mite extract; Erythromycin; Ibuprofen; Morphine and related; Sulfonamide derivatives; and Tramadol  Home Medications   Prior to Admission medications   Medication Sig Start Date End Date Taking? Authorizing Provider  aspirin  81 MG chewable tablet Chew 81 mg by mouth daily. Daily dose is 1 tab, takes 4 tabs if feeling chest pain or pressure   Yes Historical Provider, MD  cholecalciferol (VITAMIN D) 1000 UNITS tablet Take 1,000 Units by mouth daily.   Yes Historical Provider, MD  cyclobenzaprine (FLEXERIL) 10 MG tablet Take 10 mg by mouth 3 (three) times daily as needed for muscle spasms.    Yes Historical Provider, MD  hydrochlorothiazide (HYDRODIURIL) 25 MG tablet Take 12.5 mg by mouth 2 (two) times daily.   Yes Historical Provider, MD  hydrocortisone (CORTEF) 10 MG tablet Take 10-20 mg by mouth 2 (two) times daily. Pt takes 20 mg in the morning and 10 mg at night   Yes Historical Provider, MD  levothyroxine  (SYNTHROID, LEVOTHROID) 75 MCG tablet Take 75 mcg by mouth daily before breakfast.   Yes Historical Provider, MD  LORazepam (ATIVAN) 2 MG tablet Take 2 mg by mouth every 6 (six) hours as needed for anxiety.   Yes Historical Provider, MD  meclizine (ANTIVERT) 25 MG tablet Take 1 tablet (25 mg total) by mouth 4 (four) times daily as needed for dizziness. 06/15/13  Yes Janice Norrie, MD  metoCLOPramide (REGLAN) 10 MG tablet Take 20 mg by mouth 3 (three) times daily. Runny stools   Yes Historical Provider, MD  nitroGLYCERIN (NITROSTAT) 0.4 MG SL tablet Place 0.4 mg under the tongue every 5 (five) minutes as needed for chest pain.   Yes Historical Provider, MD  omega-3 acid ethyl esters (LOVAZA) 1 G capsule Take 1 g by mouth 3 (three) times daily.   Yes Historical Provider, MD  ondansetron (ZOFRAN) 4 MG tablet Take 4-8 mg by mouth every 4 (four) hours as needed for nausea.    Yes Historical Provider, MD  oxycodone (ROXICODONE) 30 MG immediate release tablet Take 30 mg by mouth every 6 (six) hours as needed for pain.    Yes Historical Provider, MD  pramipexole (MIRAPEX) 0.125 MG tablet Take 0.125 mg by mouth at bedtime as needed (restles leg syndrome).   Yes Historical Provider, MD  zolpidem (AMBIEN) 10 MG tablet Take 10 mg by mouth at bedtime.  03/20/14  Yes Historical Provider, MD   BP 149/88  Pulse 57  Temp(Src) 98.7 F (37.1 C) (Oral)  Resp 18  SpO2 92% Physical Exam  Nursing note and vitals reviewed. Constitutional: She is oriented to person, place, and time. She appears well-developed and well-nourished.  HENT:  Head: Normocephalic and atraumatic.  Eyes: Conjunctivae are normal. Right eye exhibits no discharge. Left eye exhibits no discharge.  Neck: Normal range of motion. Neck supple. No tracheal deviation present.  Cardiovascular: Normal rate and regular rhythm.   Pulmonary/Chest: Effort normal and breath sounds normal.  Abdominal: Soft. She exhibits no distension. There is tenderness (right  lower quadrant of mild). There is no guarding.  Musculoskeletal: She exhibits no edema.  Neurological: She is alert and oriented to person, place, and time.  Skin: Skin is warm. No rash noted.  Psychiatric: She has a normal mood and affect.    ED Course  Procedures (including critical care time) Labs Review Labs Reviewed  COMPREHENSIVE METABOLIC PANEL - Abnormal; Notable for the following:    Potassium 3.6 (*)    Total Bilirubin <0.2 (*)    GFR calc non Af Amer 77 (*)    GFR calc Af Amer 90 (*)    All other components within normal limits  CBC WITH DIFFERENTIAL  LIPASE, BLOOD  URINALYSIS, ROUTINE W REFLEX MICROSCOPIC    Imaging Review Ct Abdomen Pelvis W Contrast  05/11/2014   CLINICAL DATA:  Abdominal pain, headache, nausea and vomiting. Right lower quadrant pain.  EXAM: CT ABDOMEN AND PELVIS WITH CONTRAST  TECHNIQUE: Multidetector CT imaging of the abdomen and pelvis was performed using the standard protocol following bolus administration of intravenous contrast.  CONTRAST:  155mL OMNIPAQUE IOHEXOL 300 MG/ML  SOLN  COMPARISON:  04/17/2010  FINDINGS: Atelectasis in the lung bases.  Surgical absence of the gallbladder. Bile ducts are mildly prominent, likely normal for postoperative physiology. The liver, spleen, pancreas, adrenal glands, kidneys, abdominal aorta, inferior vena cava, and retroperitoneal lymph nodes are unremarkable. Stomach, small bowel, colon are unremarkable for degree of distention. No free air or free fluid in the abdomen.  Pelvis: The appendix is normal. No diverticulitis. Bladder wall is not thickened. No pelvic mass or lymphadenopathy. No free or loculated pelvic fluid collections. Mild degenerative changes in the spine. No destructive bone lesions.  IMPRESSION: No focal acute process demonstrated in the abdomen or pelvis. Appendix is normal. Surgical absence of the gallbladder. Physiologic bile duct dilatation   Electronically Signed   By: Lucienne Capers M.D.   On:  05/11/2014 05:05     EKG Interpretation None      MDM   Final diagnoses:  Abdominal pain, right lower quadrant   Well-appearing female with focal right lower cord and pain. CT abdomen to look for appendicitis or other cause of her pain. Blood work unremarkable.  Patient required second is a pain medicines. Patient overall controlled in ER. CT abdomen results reviewed no acute findings, appendix normal. Followup outpatient. Results and differential diagnosis were discussed with the patient/parent/guardian. Close follow up outpatient was discussed, comfortable with the plan.   Medications  fentaNYL (SUBLIMAZE) injection 75 mcg (75 mcg Intravenous Given 05/11/14 0238)  sodium chloride 0.9 % bolus 500 mL (0 mLs Intravenous Stopped 05/11/14 0306)  ondansetron (ZOFRAN) injection 4 mg (4 mg Intravenous Given 05/11/14 0238)  iohexol (OMNIPAQUE) 300 MG/ML solution 25 mL (25 mLs Oral Contrast Given 05/11/14 0246)  iohexol (OMNIPAQUE) 300 MG/ML solution 100 mL (100 mLs Intravenous Contrast Given 05/11/14 0423)  HYDROmorphone (DILAUDID) injection 0.5 mg (0.5 mg Intravenous Given 05/11/14 0451)    Filed Vitals:   05/11/14 0230 05/11/14 0300 05/11/14 0330 05/11/14 0509  BP: 149/88 146/91 159/95 166/69  Pulse: 57 69 55 64  Temp:      TempSrc:      Resp:    14  SpO2: 92% 92% 92%         Mariea Clonts, MD 05/11/14 [phone]

## 2014-05-11 NOTE — Discharge Instructions (Signed)
If you were given medicines take as directed.  If you are on coumadin or contraceptives realize their levels and effectiveness is altered by many different medicines.  If you have any reaction (rash, tongues swelling, other) to the medicines stop taking and see a physician.   Please follow up as directed and return to the ER or see a physician for new or worsening symptoms.  Thank you. Filed Vitals:   05/11/14 0230 05/11/14 0300 05/11/14 0330 05/11/14 0509  BP: 149/88 146/91 159/95 166/69  Pulse: 57 69 55 64  Temp:      TempSrc:      Resp:    14  SpO2: 92% 92% 92%     Abdominal Pain, Women Abdominal (stomach, pelvic, or belly) pain can be caused by many things. It is important to tell your doctor:  The location of the pain.  Does it come and go or is it present all the time?  Are there things that start the pain (eating certain foods, exercise)?  Are there other symptoms associated with the pain (fever, nausea, vomiting, diarrhea)? All of this is helpful to know when trying to find the cause of the pain. CAUSES   Stomach: virus or bacteria infection, or ulcer.  Intestine: appendicitis (inflamed appendix), regional ileitis (Crohn's disease), ulcerative colitis (inflamed colon), irritable bowel syndrome, diverticulitis (inflamed diverticulum of the colon), or cancer of the stomach or intestine.  Gallbladder disease or stones in the gallbladder.  Kidney disease, kidney stones, or infection.  Pancreas infection or cancer.  Fibromyalgia (pain disorder).  Diseases of the female organs:  Uterus: fibroid (non-cancerous) tumors or infection.  Fallopian tubes: infection or tubal pregnancy.  Ovary: cysts or tumors.  Pelvic adhesions (scar tissue).  Endometriosis (uterus lining tissue growing in the pelvis and on the pelvic organs).  Pelvic congestion syndrome (female organs filling up with blood just before the menstrual period).  Pain with the menstrual period.  Pain with  ovulation (producing an egg).  Pain with an IUD (intrauterine device, birth control) in the uterus.  Cancer of the female organs.  Functional pain (pain not caused by a disease, may improve without treatment).  Psychological pain.  Depression. DIAGNOSIS  Your doctor will decide the seriousness of your pain by doing an examination.  Blood tests.  X-rays.  Ultrasound.  CT scan (computed tomography, special type of X-ray).  MRI (magnetic resonance imaging).  Cultures, for infection.  Barium enema (dye inserted in the large intestine, to better view it with X-rays).  Colonoscopy (looking in intestine with a lighted tube).  Laparoscopy (minor surgery, looking in abdomen with a lighted tube).  Major abdominal exploratory surgery (looking in abdomen with a large incision). TREATMENT  The treatment will depend on the cause of the pain.   Many cases can be observed and treated at home.  Over-the-counter medicines recommended by your caregiver.  Prescription medicine.  Antibiotics, for infection.  Birth control pills, for painful periods or for ovulation pain.  Hormone treatment, for endometriosis.  Nerve blocking injections.  Physical therapy.  Antidepressants.  Counseling with a psychologist or psychiatrist.  Minor or major surgery. HOME CARE INSTRUCTIONS   Do not take laxatives, unless directed by your caregiver.  Take over-the-counter pain medicine only if ordered by your caregiver. Do not take aspirin because it can cause an upset stomach or bleeding.  Try a clear liquid diet (broth or water) as ordered by your caregiver. Slowly move to a bland diet, as tolerated, if the pain is  related to the stomach or intestine.  Have a thermometer and take your temperature several times a day, and record it.  Bed rest and sleep, if it helps the pain.  Avoid sexual intercourse, if it causes pain.  Avoid stressful situations.  Keep your follow-up appointments and  tests, as your caregiver orders.  If the pain does not go away with medicine or surgery, you may try:  Acupuncture.  Relaxation exercises (yoga, meditation).  Group therapy.  Counseling. SEEK MEDICAL CARE IF:   You notice certain foods cause stomach pain.  Your home care treatment is not helping your pain.  You need stronger pain medicine.  You want your IUD removed.  You feel faint or lightheaded.  You develop nausea and vomiting.  You develop a rash.  You are having side effects or an allergy to your medicine. SEEK IMMEDIATE MEDICAL CARE IF:   Your pain does not go away or gets worse.  You have a fever.  Your pain is felt only in portions of the abdomen. The right side could possibly be appendicitis. The left lower portion of the abdomen could be colitis or diverticulitis.  You are passing blood in your stools (bright red or black tarry stools, with or without vomiting).  You have blood in your urine.  You develop chills, with or without a fever.  You pass out. MAKE SURE YOU:   Understand these instructions.  Will watch your condition.  Will get help right away if you are not doing well or get worse. Document Released: 06/20/2007 Document Revised: 01/07/2014 Document Reviewed: 07/10/2009 [facility] Patient Information 2015 [place], [place]. This information is not intended to replace advice given to you by your health care provider. Make sure you discuss any questions you have with your health care provider.

## 2014-06-14 DIAGNOSIS — E6609 Other obesity due to excess calories: Secondary | ICD-10-CM | POA: Diagnosis not present

## 2014-06-14 DIAGNOSIS — J0100 Acute maxillary sinusitis, unspecified: Secondary | ICD-10-CM | POA: Diagnosis not present

## 2014-06-14 DIAGNOSIS — J9801 Acute bronchospasm: Secondary | ICD-10-CM | POA: Diagnosis not present

## 2014-06-14 DIAGNOSIS — Z6830 Body mass index (BMI) 30.0-30.9, adult: Secondary | ICD-10-CM | POA: Diagnosis not present

## 2014-06-14 IMAGING — CR DG LUMBAR SPINE COMPLETE 4+V
5 series · 5 of 5 positions shown · non-contrast
Comparison: Radiographs and MRI dated 09/20/2012

CLINICAL DATA: Low back and bilateral leg pain.

EXAM:
LUMBAR SPINE - COMPLETE 4+ VIEW

[t lumbar spine ap]
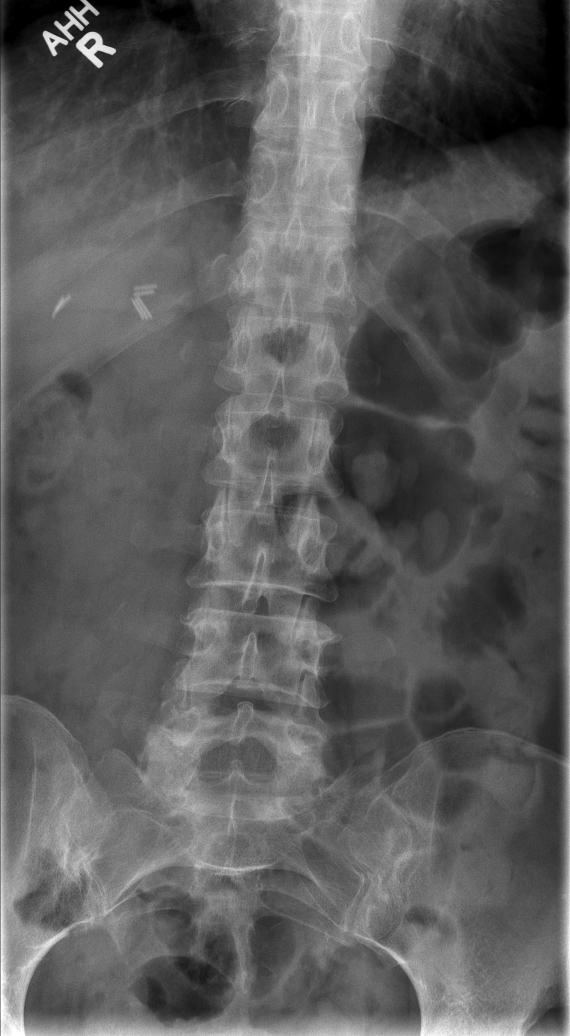

[t lumbar spine obl (1 of 2)]
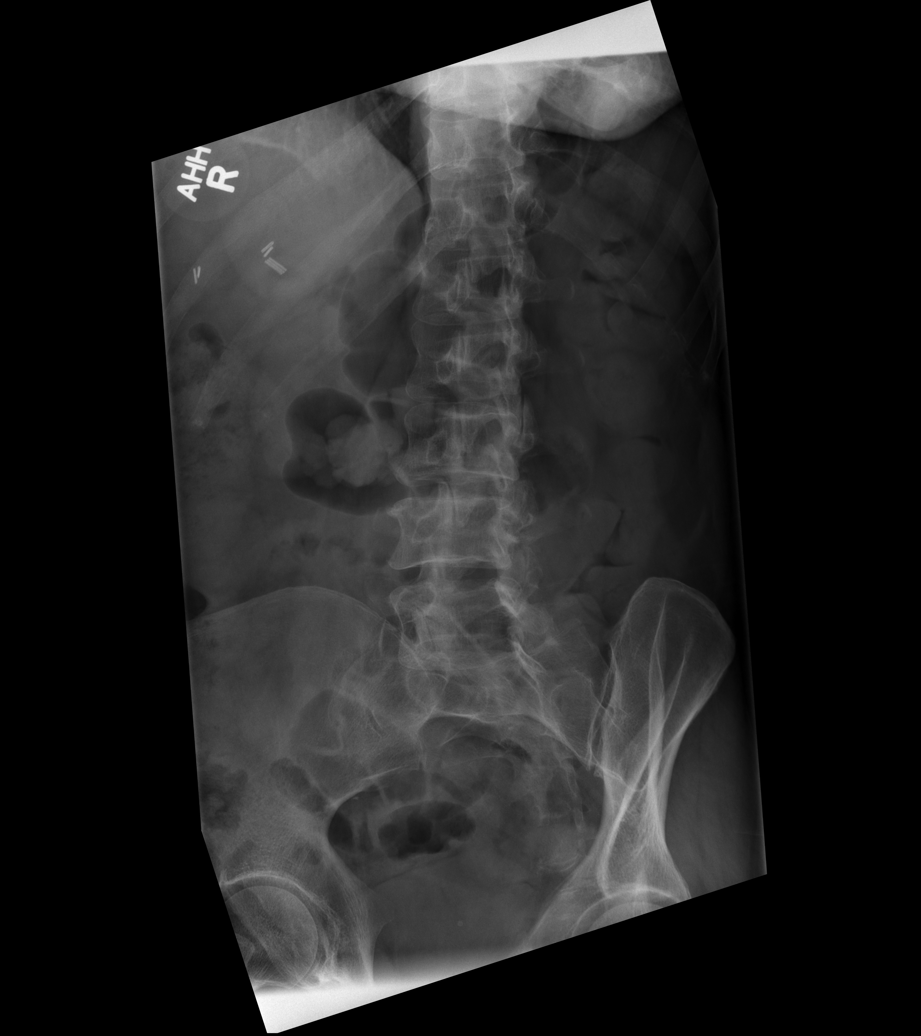

[t lumbar spine obl (2 of 2)]
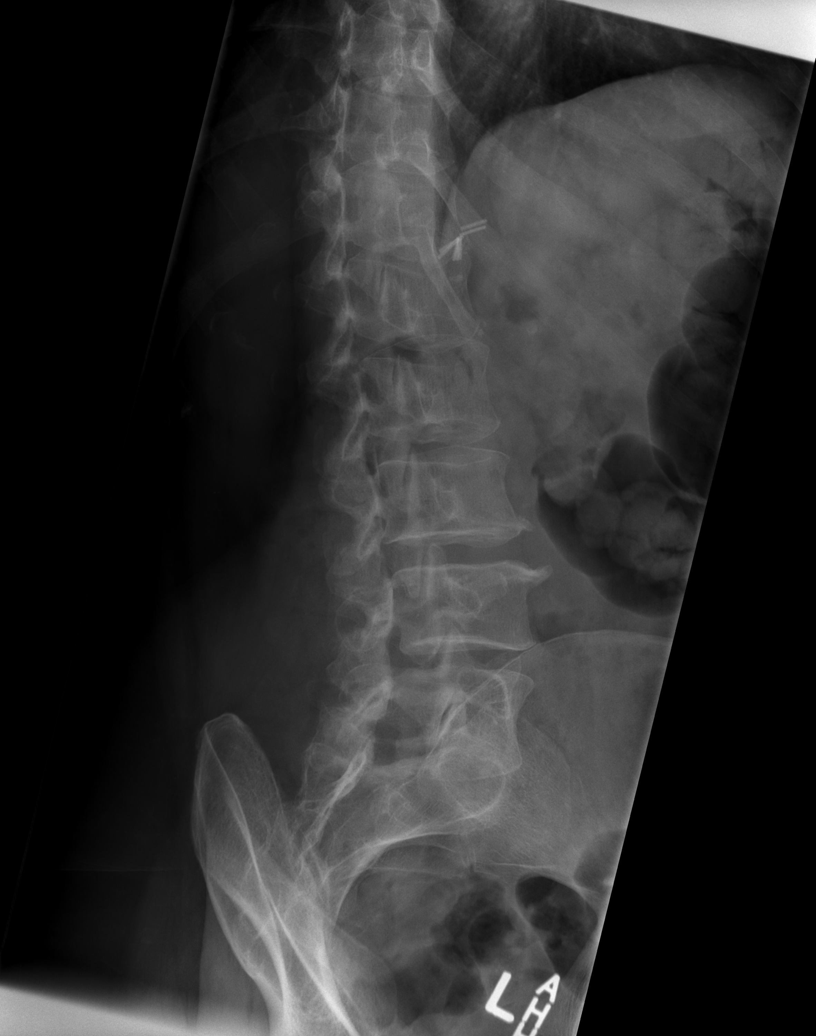

[t lumbar spine lat]
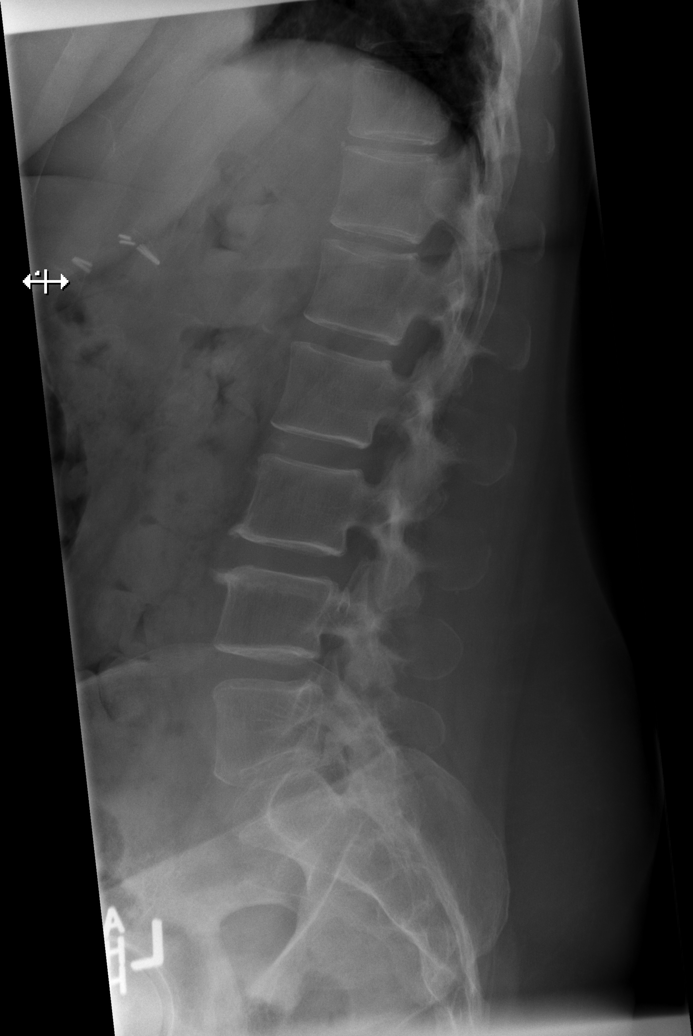

[t lumbar l-5 s-1 spot]
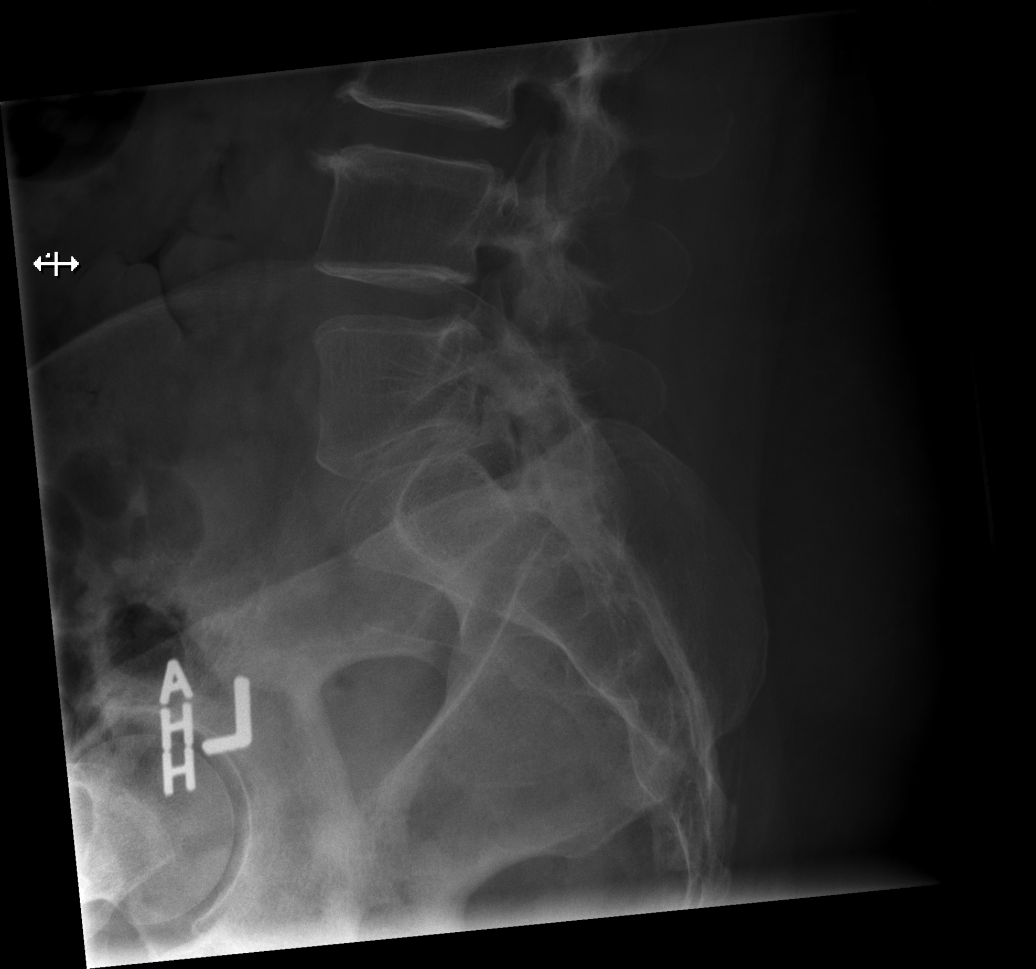

[5 of 5 positions shown; findings below may reference images not displayed]

FINDINGS: There is no fracture, subluxation, or bone destruction. There is
slight right facet arthritis at L3-4 and L5-S1, unchanged.
IMPRESSION: No acute abnormality of the lumbar spine. Degenerative changes as
described.

## 2014-07-08 ENCOUNTER — Encounter: Payer: Self-pay | Admitting: Internal Medicine

## 2014-07-08 ENCOUNTER — Encounter (HOSPITAL_COMMUNITY): Payer: Self-pay | Admitting: *Deleted

## 2014-07-08 DIAGNOSIS — M1991 Primary osteoarthritis, unspecified site: Secondary | ICD-10-CM | POA: Diagnosis not present

## 2014-07-08 DIAGNOSIS — G894 Chronic pain syndrome: Secondary | ICD-10-CM | POA: Diagnosis not present

## 2014-07-08 DIAGNOSIS — Z6830 Body mass index (BMI) 30.0-30.9, adult: Secondary | ICD-10-CM | POA: Diagnosis not present

## 2014-07-08 DIAGNOSIS — E271 Primary adrenocortical insufficiency: Secondary | ICD-10-CM | POA: Diagnosis not present

## 2014-07-30 DIAGNOSIS — Z1211 Encounter for screening for malignant neoplasm of colon: Secondary | ICD-10-CM | POA: Diagnosis not present

## 2014-07-30 DIAGNOSIS — D126 Benign neoplasm of colon, unspecified: Secondary | ICD-10-CM | POA: Diagnosis not present

## 2014-07-30 DIAGNOSIS — D122 Benign neoplasm of ascending colon: Secondary | ICD-10-CM | POA: Diagnosis not present

## 2014-07-30 DIAGNOSIS — Z800 Family history of malignant neoplasm of digestive organs: Secondary | ICD-10-CM | POA: Diagnosis not present

## 2014-08-02 IMAGING — US US ABDOMEN COMPLETE
1 series · 14 of 25 positions shown · non-contrast
Comparison: Ultrasound April 17, 2010.

CLINICAL DATA: Elevated liver function tests.

EXAM:
ULTRASOUND ABDOMEN COMPLETE

[Series 1: us abdomen complete · 0.18mm/px · 14 of 65 slices shown]
[im 1/65]
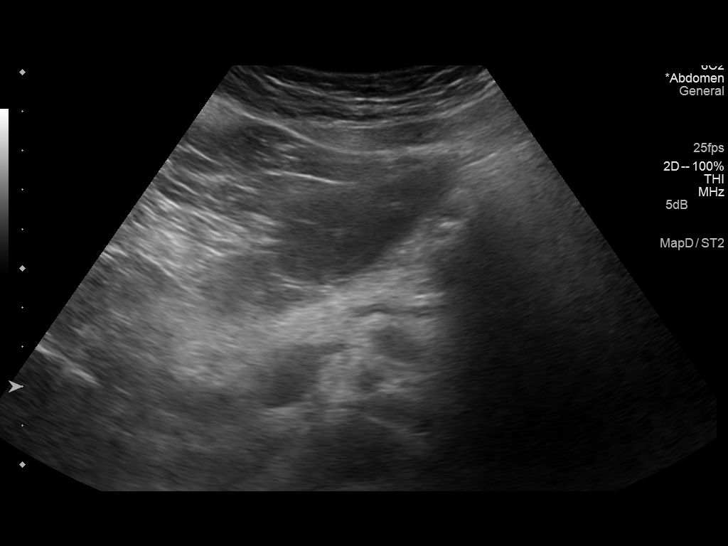
[im 6/65]
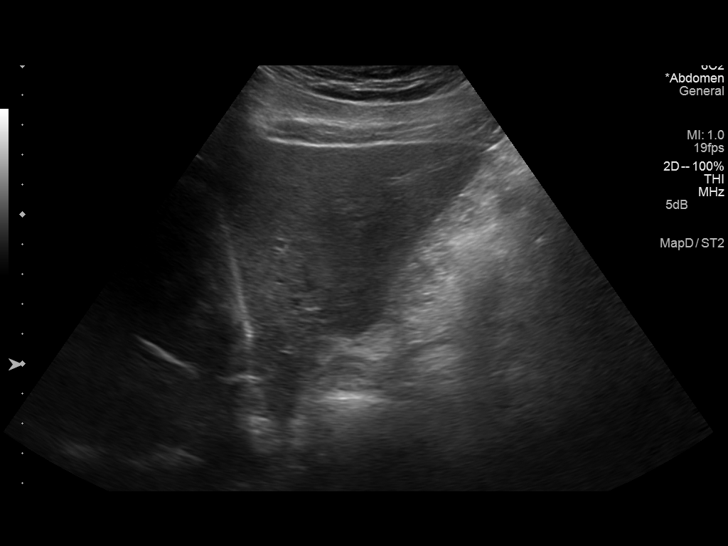
[im 11/65]
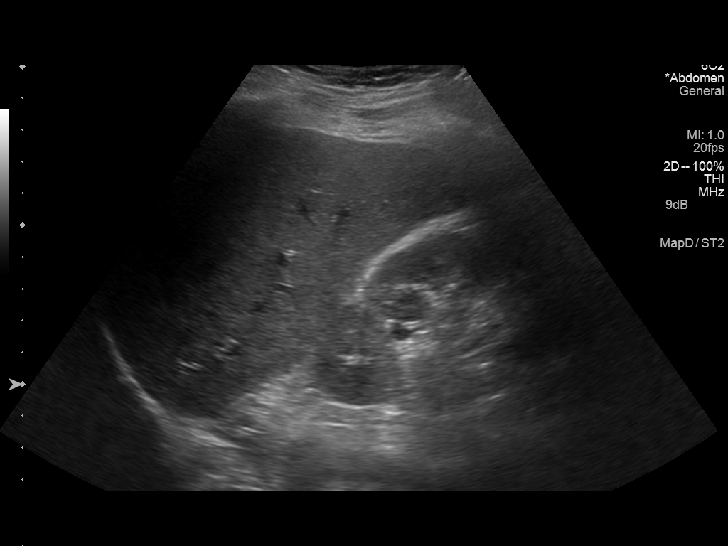
[im 17/65]
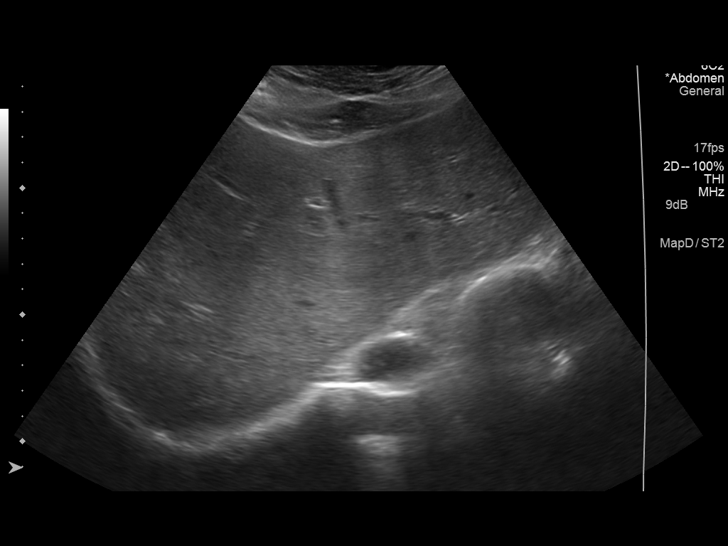
[im 22/65]
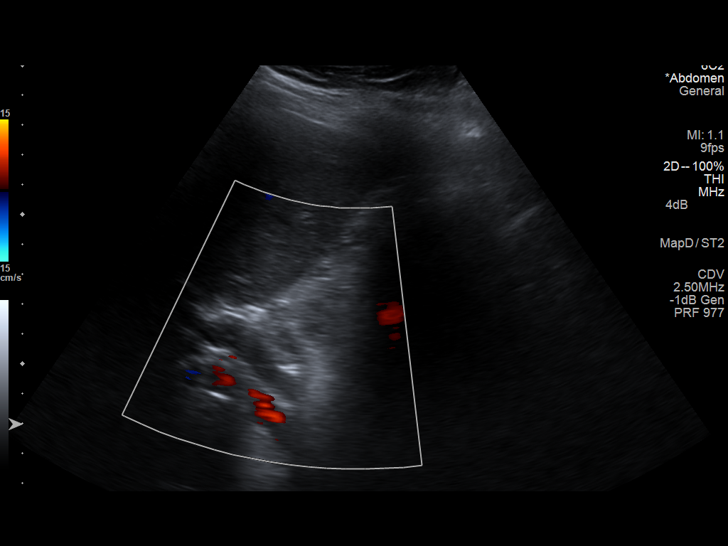
[im 25/65]
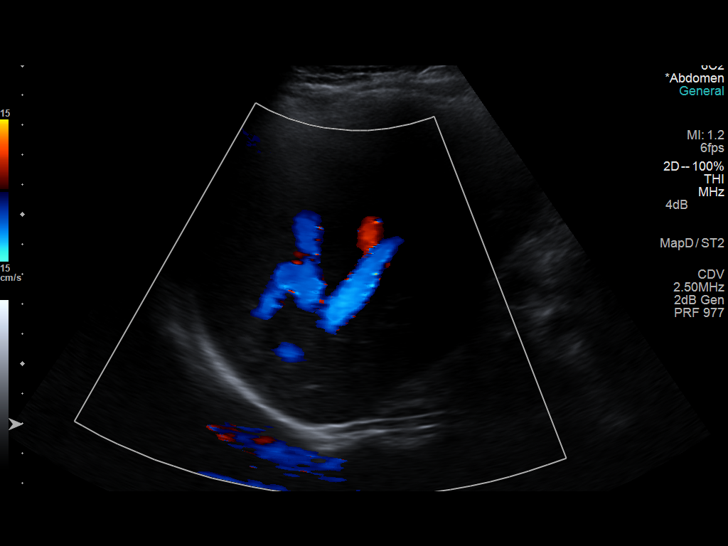
[im 30/65]
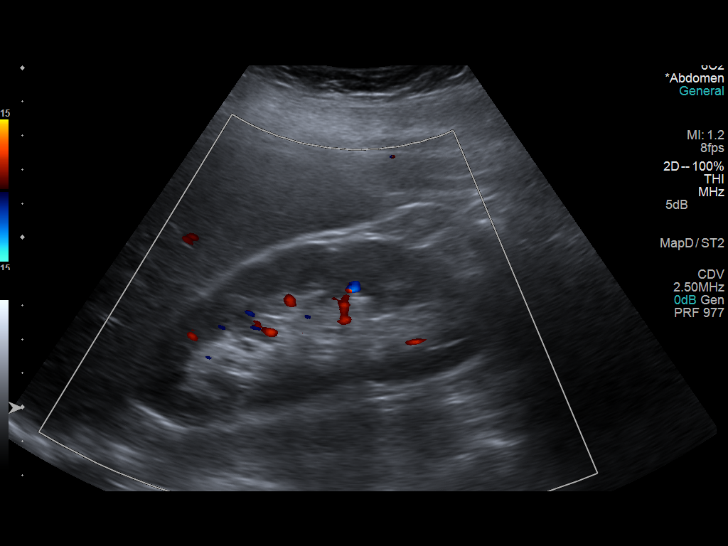
[im 35/65]
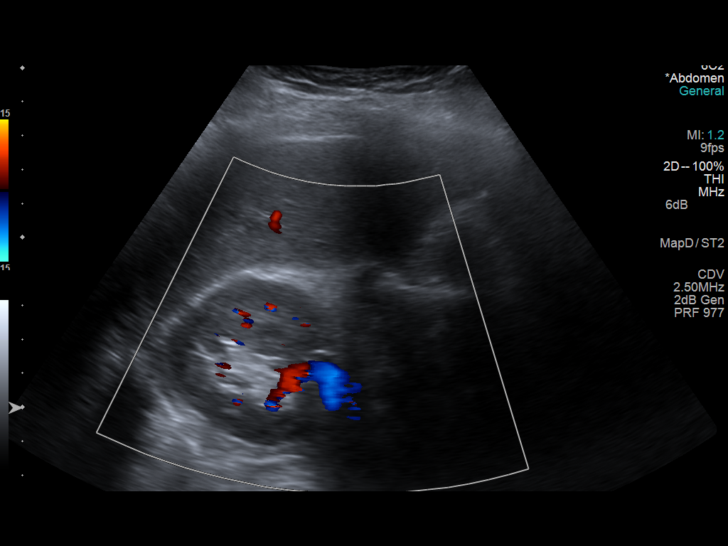
[im 41/65]
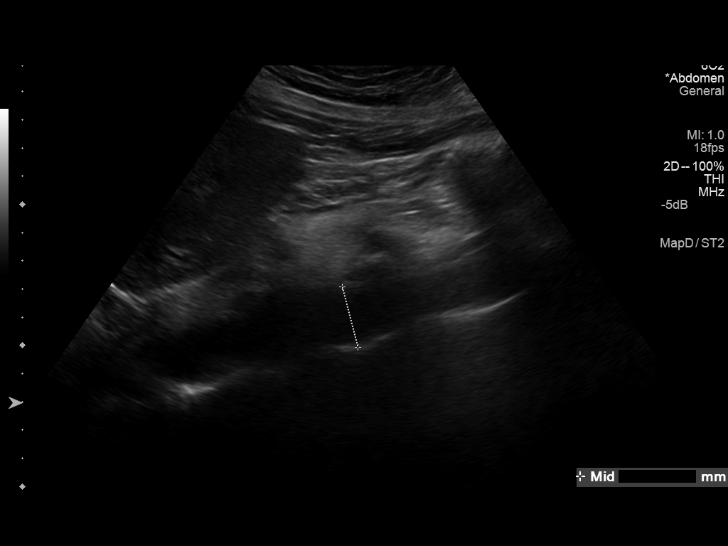
[im 43/65]
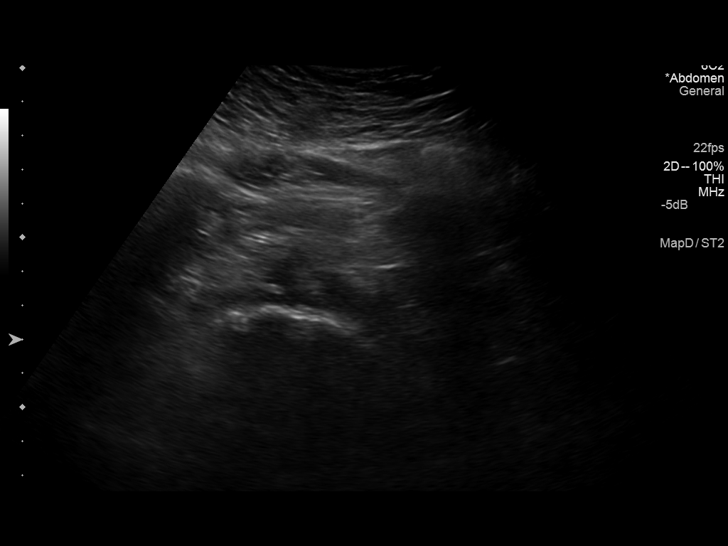
[im 49/65]
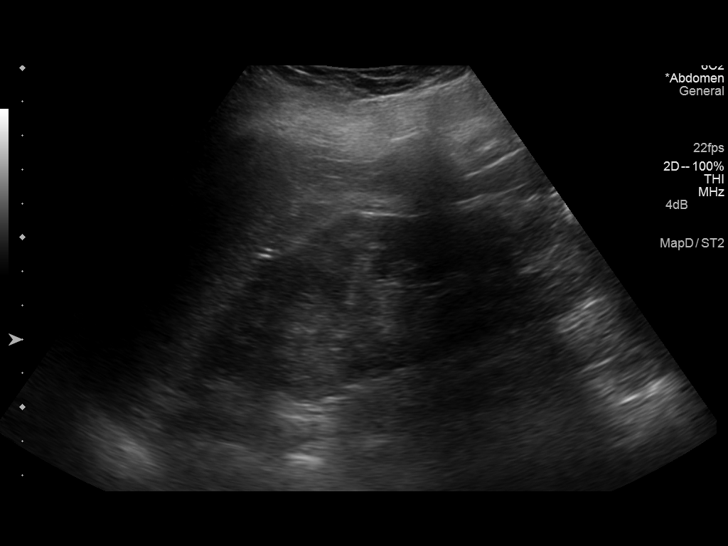
[im 54/65]
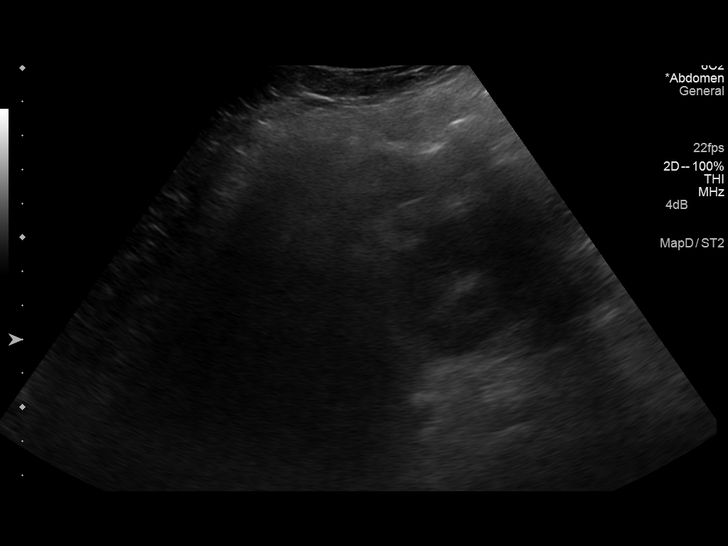
[im 59/65]
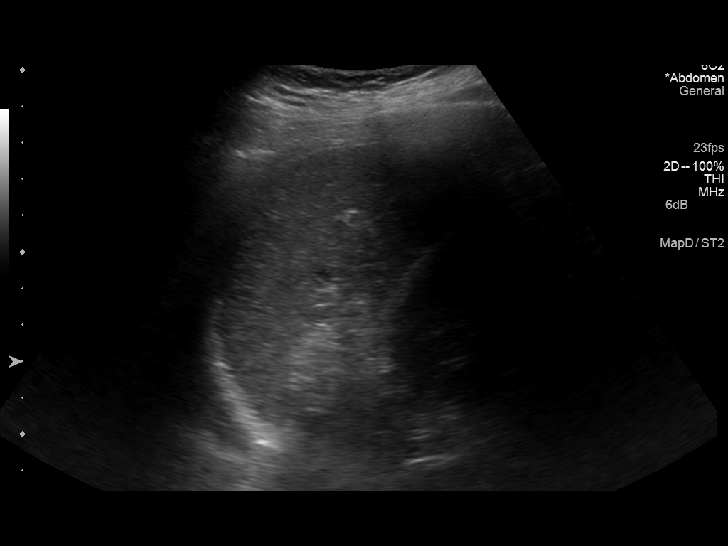
[im 65/65]
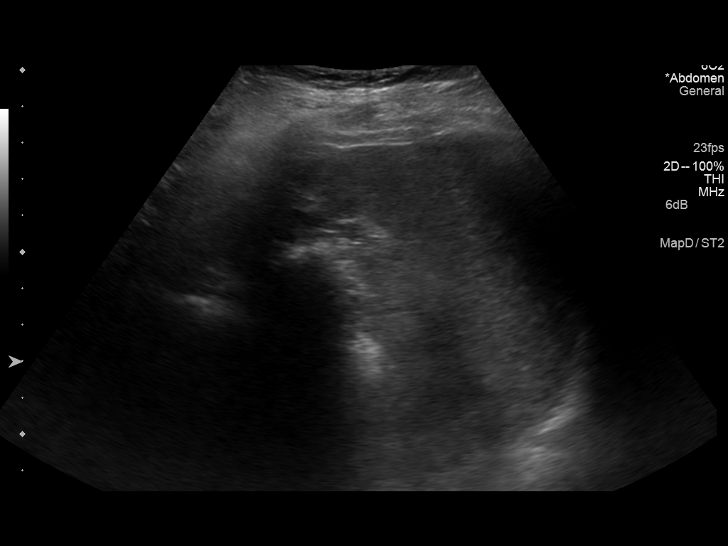

[14 of 25 positions shown; findings below may reference images not displayed]

FINDINGS: Gallbladder:

Status post cholecystectomy.

Common bile duct:

Diameter: Measures 6.9 mm which is within normal limits for a
patient who is status post cholecystectomy.

Liver:

No focal lesion identified. Within normal limits in parenchymal
echogenicity.

IVC:

No abnormality visualized.

Pancreas:

Visualized portion unremarkable.

Spleen:

Size and appearance within normal limits.

Right Kidney:

Length: 10.8 cm. Echogenicity within normal limits. No mass or
hydronephrosis visualized.

Left Kidney:

Length: 11.3 cm. Echogenicity within normal limits. No mass or
hydronephrosis visualized.

Abdominal aorta:

No aneurysm visualized.

Other findings:

Possible left pleural effusion.
IMPRESSION: Status post cholecystectomy. Possible left pleural effusion. No
other significant abnormality seen in the abdomen.

## 2014-08-04 ENCOUNTER — Emergency Department (HOSPITAL_COMMUNITY)
Admission: EM | Admit: 2014-08-04 | Discharge: 2014-08-04 | Disposition: A | Discharge status: Home / Self Care | Payer: Medicare Other | Attending: Emergency Medicine | Admitting: Emergency Medicine

## 2014-08-04 ENCOUNTER — Encounter (HOSPITAL_COMMUNITY): Payer: Self-pay | Admitting: Emergency Medicine

## 2014-08-04 ENCOUNTER — Emergency Department (HOSPITAL_COMMUNITY): Payer: Medicare Other

## 2014-08-04 DIAGNOSIS — Z720 Tobacco use: Secondary | ICD-10-CM | POA: Diagnosis not present

## 2014-08-04 DIAGNOSIS — I509 Heart failure, unspecified: Secondary | ICD-10-CM | POA: Diagnosis not present

## 2014-08-04 DIAGNOSIS — R112 Nausea with vomiting, unspecified: Secondary | ICD-10-CM

## 2014-08-04 DIAGNOSIS — Z7952 Long term (current) use of systemic steroids: Secondary | ICD-10-CM | POA: Insufficient documentation

## 2014-08-04 DIAGNOSIS — Z3202 Encounter for pregnancy test, result negative: Secondary | ICD-10-CM | POA: Diagnosis not present

## 2014-08-04 DIAGNOSIS — E039 Hypothyroidism, unspecified: Secondary | ICD-10-CM | POA: Insufficient documentation

## 2014-08-04 DIAGNOSIS — R1031 Right lower quadrant pain: Secondary | ICD-10-CM | POA: Insufficient documentation

## 2014-08-04 DIAGNOSIS — Z79899 Other long term (current) drug therapy: Secondary | ICD-10-CM | POA: Diagnosis not present

## 2014-08-04 DIAGNOSIS — K529 Noninfective gastroenteritis and colitis, unspecified: Secondary | ICD-10-CM

## 2014-08-04 DIAGNOSIS — R197 Diarrhea, unspecified: Secondary | ICD-10-CM | POA: Insufficient documentation

## 2014-08-04 DIAGNOSIS — Z880 Allergy status to penicillin: Secondary | ICD-10-CM | POA: Diagnosis not present

## 2014-08-04 DIAGNOSIS — I10 Essential (primary) hypertension: Secondary | ICD-10-CM | POA: Insufficient documentation

## 2014-08-04 DIAGNOSIS — I2510 Atherosclerotic heart disease of native coronary artery without angina pectoris: Secondary | ICD-10-CM | POA: Insufficient documentation

## 2014-08-04 DIAGNOSIS — R109 Unspecified abdominal pain: Secondary | ICD-10-CM

## 2014-08-04 LAB — COMPREHENSIVE METABOLIC PANEL
ALT: 11 U/L (ref 0–35)
AST: 18 U/L (ref 0–37)
Albumin: 3.8 g/dL (ref 3.5–5.2)
Alkaline Phosphatase: 88 U/L (ref 39–117)
Anion gap: 17 — ABNORMAL HIGH (ref 5–15)
BUN: 9 mg/dL (ref 6–23)
CO2: 21 mEq/L (ref 19–32)
Calcium: 9.6 mg/dL (ref 8.4–10.5)
Chloride: 99 mEq/L (ref 96–112)
Creatinine, Ser: 0.56 mg/dL (ref 0.50–1.10)
GFR calc non Af Amer: >90 mL/min (ref >90)
GLUCOSE: 105 mg/dL — AB (ref 70–99)
Potassium: 3.5 mEq/L — ABNORMAL LOW (ref 3.7–5.3)
SODIUM: 137 meq/L (ref 137–147)
TOTAL PROTEIN: 8 g/dL (ref 6.0–8.3)
Total Bilirubin: 0.5 mg/dL (ref 0.3–1.2)

## 2014-08-04 LAB — CBC WITH DIFFERENTIAL/PLATELET
BASOS ABS: 0.1 10*3/uL (ref 0.0–0.1)
Basophils Relative: 1 % (ref 0–1)
EOS ABS: 0.2 10*3/uL (ref 0.0–0.7)
EOS PCT: 1 % (ref 0–5)
HCT: 46.9 % — ABNORMAL HIGH (ref 36.0–46.0)
Hemoglobin: 16.3 g/dL — ABNORMAL HIGH (ref 12.0–15.0)
Lymphocytes Relative: 24 % (ref 12–46)
Lymphs Abs: 3.5 10*3/uL (ref 0.7–4.0)
MCH: 30 pg (ref 26.0–34.0)
MCHC: 34.8 g/dL (ref 30.0–36.0)
MCV: 86.4 fL (ref 78.0–100.0)
Monocytes Absolute: 0.9 10*3/uL (ref 0.1–1.0)
Monocytes Relative: 6 % (ref 3–12)
Neutro Abs: 10 10*3/uL — ABNORMAL HIGH (ref 1.7–7.7)
Neutrophils Relative %: 68 % (ref 43–77)
PLATELETS: 365 10*3/uL (ref 150–400)
RBC: 5.43 MIL/uL — ABNORMAL HIGH (ref 3.87–5.11)
RDW: 14.7 % (ref 11.5–15.5)
WBC: 14.6 10*3/uL — ABNORMAL HIGH (ref 4.0–10.5)

## 2014-08-04 LAB — URINALYSIS, ROUTINE W REFLEX MICROSCOPIC
GLUCOSE, UA: NEGATIVE mg/dL
Hgb urine dipstick: NEGATIVE
Ketones, ur: 15 mg/dL — AB
Leukocytes, UA: NEGATIVE
Nitrite: NEGATIVE
PROTEIN: NEGATIVE mg/dL
Specific Gravity, Urine: 1.031 — ABNORMAL HIGH (ref 1.005–1.030)
Urobilinogen, UA: 1 mg/dL (ref 0.0–1.0)
pH: 6 (ref 5.0–8.0)

## 2014-08-04 LAB — LIPASE, BLOOD: Lipase: 30 U/L (ref 11–59)

## 2014-08-04 LAB — POC URINE PREG, ED: PREG TEST UR: NEGATIVE

## 2014-08-04 MED ORDER — ONDANSETRON HCL 4 MG/2ML IJ SOLN
4.0000 mg | Freq: Once | INTRAMUSCULAR | Status: AC
  Administered 2014-08-04: 4 mg via INTRAVENOUS
  Filled 2014-08-04: qty 2

## 2014-08-04 MED ORDER — HYDROCORTISONE NA SUCCINATE PF 100 MG IJ SOLR
100.0000 mg | Freq: Once | INTRAMUSCULAR | Status: AC
  Administered 2014-08-04: 100 mg via INTRAVENOUS
  Filled 2014-08-04: qty 2

## 2014-08-04 MED ORDER — FENTANYL CITRATE 0.05 MG/ML IJ SOLN
100.0000 ug | Freq: Once | INTRAMUSCULAR | Status: AC
  Administered 2014-08-04: 100 ug via INTRAVENOUS
  Filled 2014-08-04: qty 2

## 2014-08-04 MED ORDER — PROMETHAZINE HCL 25 MG RE SUPP: 25.0000 mg | Freq: Four times a day (QID) | RECTAL | Status: DC | PRN

## 2014-08-04 MED ORDER — SODIUM CHLORIDE 0.9 % IV BOLUS (SEPSIS)
500.0000 mL | Freq: Once | INTRAVENOUS | Status: AC
  Administered 2014-08-04: 500 mL via INTRAVENOUS

## 2014-08-04 NOTE — ED Notes (Signed)
Pt presents with right sided abdominal pain upper and lower for the past 2 days- admits to N/V/D associated with symptoms.  Last vomited around 6am, pt admits to having diarrhea throughout the night.

## 2014-08-04 NOTE — Discharge Instructions (Signed)
Abdominal Pain, Women °Abdominal (stomach, pelvic, or belly) pain can be caused by many things. It is important to tell your doctor: °· The location of the pain. °· Does it come and go or is it present all the time? °· Are there things that start the pain (eating certain foods, exercise)? °· Are there other symptoms associated with the pain (fever, nausea, vomiting, diarrhea)? °All of this is helpful to know when trying to find the cause of the pain. °CAUSES  °· Stomach: virus or bacteria infection, or ulcer. °· Intestine: appendicitis (inflamed appendix), regional ileitis (Crohn's disease), ulcerative colitis (inflamed colon), irritable bowel syndrome, diverticulitis (inflamed diverticulum of the colon), or cancer of the stomach or intestine. °· Gallbladder disease or stones in the gallbladder. °· Kidney disease, kidney stones, or infection. °· Pancreas infection or cancer. °· Fibromyalgia (pain disorder). °· Diseases of the female organs: °· Uterus: fibroid (non-cancerous) tumors or infection. °· Fallopian tubes: infection or tubal pregnancy. °· Ovary: cysts or tumors. °· Pelvic adhesions (scar tissue). °· Endometriosis (uterus lining tissue growing in the pelvis and on the pelvic organs). °· Pelvic congestion syndrome (female organs filling up with blood just before the menstrual period). °· Pain with the menstrual period. °· Pain with ovulation (producing an egg). °· Pain with an IUD (intrauterine device, birth control) in the uterus. °· Cancer of the female organs. °· Functional pain (pain not caused by a disease, may improve without treatment). °· Psychological pain. °· Depression. °DIAGNOSIS  °Your doctor will decide the seriousness of your pain by doing an examination. °· Blood tests. °· X-rays. °· Ultrasound. °· CT scan (computed tomography, special type of X-ray). °· MRI (magnetic resonance imaging). °· Cultures, for infection. °· Barium enema (dye inserted in the large intestine, to better view it with  X-rays). °· Colonoscopy (looking in intestine with a lighted tube). °· Laparoscopy (minor surgery, looking in abdomen with a lighted tube). °· Major abdominal exploratory surgery (looking in abdomen with a large incision). °TREATMENT  °The treatment will depend on the cause of the pain.  °· Many cases can be observed and treated at home. °· Over-the-counter medicines recommended by your caregiver. °· Prescription medicine. °· Antibiotics, for infection. °· Birth control pills, for painful periods or for ovulation pain. °· Hormone treatment, for endometriosis. °· Nerve blocking injections. °· Physical therapy. °· Antidepressants. °· Counseling with a psychologist or psychiatrist. °· Minor or major surgery. °HOME CARE INSTRUCTIONS  °· Do not take laxatives, unless directed by your caregiver. °· Take over-the-counter pain medicine only if ordered by your caregiver. Do not take aspirin because it can cause an upset stomach or bleeding. °· Try a clear liquid diet (broth or water) as ordered by your caregiver. Slowly move to a bland diet, as tolerated, if the pain is related to the stomach or intestine. °· Have a thermometer and take your temperature several times a day, and record it. °· Bed rest and sleep, if it helps the pain. °· Avoid sexual intercourse, if it causes pain. °· Avoid stressful situations. °· Keep your follow-up appointments and tests, as your caregiver orders. °· If the pain does not go away with medicine or surgery, you may try: °· Acupuncture. °· Relaxation exercises (yoga, meditation). °· Group therapy. °· Counseling. °SEEK MEDICAL CARE IF:  °· You notice certain foods cause stomach pain. °· Your home care treatment is not helping your pain. °· You need stronger pain medicine. °· You want your IUD removed. °· You feel faint or   lightheaded. °· You develop nausea and vomiting. °· You develop a rash. °· You are having side effects or an allergy to your medicine. °SEEK IMMEDIATE MEDICAL CARE IF:  °· Your  pain does not go away or gets worse. °· You have a fever. °· Your pain is felt only in portions of the abdomen. The right side could possibly be appendicitis. The left lower portion of the abdomen could be colitis or diverticulitis. °· You are passing blood in your stools (bright red or black tarry stools, with or without vomiting). °· You have blood in your urine. °· You develop chills, with or without a fever. °· You pass out. °MAKE SURE YOU:  °· Understand these instructions. °· Will watch your condition. °· Will get help right away if you are not doing well or get worse. °Document Released: 06/20/2007 Document Revised: 01/07/2014 Document Reviewed: 07/10/2009 °[employer]® Patient Information ©2015 [employer]. This information is not intended to replace advice given to you by your health care provider. Make sure you discuss any questions you have with your health care provider. ° °Nausea and Vomiting °Nausea is a sick feeling that often comes before throwing up (vomiting). Vomiting is a reflex where stomach contents come out of your mouth. Vomiting can cause severe loss of body fluids (dehydration). Children and elderly adults can become dehydrated quickly, especially if they also have diarrhea. Nausea and vomiting are symptoms of a condition or disease. It is important to find the cause of your symptoms. °CAUSES  °· Direct irritation of the stomach lining. This irritation can result from increased acid production (gastroesophageal reflux disease), infection, food poisoning, taking certain medicines (such as nonsteroidal anti-inflammatory drugs), alcohol use, or tobacco use. °· Signals from the brain. These signals could be caused by a headache, heat exposure, an inner ear disturbance, increased pressure in the brain from injury, infection, a tumor, or a concussion, pain, emotional stimulus, or metabolic problems. °· An obstruction in the gastrointestinal tract (bowel obstruction). °· Illnesses such as diabetes,  hepatitis, gallbladder problems, appendicitis, kidney problems, cancer, sepsis, atypical symptoms of a heart attack, or eating disorders. °· Medical treatments such as chemotherapy and radiation. °· Receiving medicine that makes you sleep (general anesthetic) during surgery. °DIAGNOSIS °Your caregiver may ask for tests to be done if the problems do not improve after a few days. Tests may also be done if symptoms are severe or if the reason for the nausea and vomiting is not clear. Tests may include: °· Urine tests. °· Blood tests. °· Stool tests. °· Cultures (to look for evidence of infection). °· X-rays or other imaging studies. °Test results can help your caregiver make decisions about treatment or the need for additional tests. °TREATMENT °You need to stay well hydrated. Drink frequently but in small amounts. You may wish to drink water, sports drinks, clear broth, or eat frozen ice pops or gelatin dessert to help stay hydrated. When you eat, eating slowly may help prevent nausea. There are also some antinausea medicines that may help prevent nausea. °HOME CARE INSTRUCTIONS  °· Take all medicine as directed by your caregiver. °· If you do not have an appetite, do not force yourself to eat. However, you must continue to drink fluids. °· If you have an appetite, eat a normal diet unless your caregiver tells you differently. °¨ Eat a variety of complex carbohydrates (rice, wheat, potatoes, bread), lean meats, yogurt, fruits, and vegetables. °¨ Avoid high-fat foods because they are more difficult to digest. °· Drink enough water and fluids   to keep your urine clear or pale yellow. °· If you are dehydrated, ask your caregiver for specific rehydration instructions. Signs of dehydration may include: °¨ Severe thirst. °¨ Dry lips and mouth. °¨ Dizziness. °¨ Dark urine. °¨ Decreasing urine frequency and amount. °¨ Confusion. °¨ Rapid breathing or pulse. °SEEK IMMEDIATE MEDICAL CARE IF:  °· You have blood or brown flecks  (like coffee grounds) in your vomit. °· You have black or bloody stools. °· You have a severe headache or stiff neck. °· You are confused. °· You have severe abdominal pain. °· You have chest pain or trouble breathing. °· You do not urinate at least once every 8 hours. °· You develop cold or clammy skin. °· You continue to vomit for longer than 24 to 48 hours. °· You have a fever. °MAKE SURE YOU:  °· Understand these instructions. °· Will watch your condition. °· Will get help right away if you are not doing well or get worse. °Document Released: 08/23/2005 Document Revised: 11/15/2011 Document Reviewed: 01/20/2011 °[employer]® Patient Information ©2015 [employer]. This information is not intended to replace advice given to you by your health care provider. Make sure you discuss any questions you have with your health care provider. ° °

## 2014-08-04 NOTE — ED Notes (Signed)
Patient transported to X-ray 

## 2014-08-04 NOTE — ED Notes (Signed)
Pt given fluids to see how well tolerated- will reassess

## 2014-08-04 NOTE — ED Notes (Signed)
PA at bedside.

## 2014-08-04 NOTE — ED Notes (Signed)
Pt tolerated PO fluid without difficulty

## 2014-08-04 NOTE — ED Provider Notes (Signed)
CSN: [id number]     Arrival date & time 08/04/14  4580 History   First MD Initiated Contact with Patient 08/04/14 [phone]     Chief Complaint  Patient presents with  . Abdominal Pain   HPI  Patient is a 48 year old female who presents to the emergency room for evaluation of right lower quadrant abdominal pain, nausea, vomiting, and diarrhea. Patient has past medical history of cardiomyopathy, adrenal insufficiency, nondiabetic gastroparesis, chronic pain, chronic diarrhea, and CHF. Said she was doing okay until yesterday morning when she started to feel very fatigued and nauseous. She states that she had nausea and vomiting too many to count episodes yesterday and this morning. She also states that she has had severe diarrhea that has occurred all throughout yesterday and today. Patient has right lower quadrant pain which she rates as a 9 out of 10 sharp stabbing pain. It is there constantly. She states that she thinks that she is very dehydrated. She says due to her adrenal insufficiency she should not get dehydrated. She tried taking Zofran at home with no relief. She denies any recent travel, sick contacts, antibiotic use, or suspicious food intake. She has had previous hysterectomy and cholecystectomy. She still has her appendix.  Past Medical History  Diagnosis Date  . Cardiomyopathy     resolved  . Chest pain     chronicc  . Hyperlipidemia   . HTN (hypertension)   . Hypothyroidism   . Adrenal insufficiency     diagnosed 2012  . Anxiety   . Nondiabetic gastroparesis   . CAD (coronary artery disease)     Cath 2008 EF normal. RCA 50-60, Septal 50%. Myoview 3/12: EF 53% normal perfusion  . Chronic back pain   . QT prolongation   . Mitral valve prolapse   . Gastroparesis   . Chronic diarrhea   . Astigmatism   . Concussion     sept 28th 2014  . CHF (congestive heart failure)   . Cardiac arrest     2/2 adissonian crisis  . Tobacco abuse     down to 2 cigarettes per day   Past  Surgical History  Procedure Laterality Date  . Cholecystectomy    . Spine surgery    . Vesicovaginal fistula closure w/ tah    . Varicose vein surgery    . Abdominal hysterectomy    . Cardiac catheterization  03/2007    showed 60% lesion in the right coronary artery   Family History  Problem Relation Age of Onset  . Coronary artery disease    . Heart attack Mother   . Cancer Father     Colon   History  Substance Use Topics  . Smoking status: Current Every Day Smoker -- 0.50 packs/day for 10 years    Types: Cigarettes  . Smokeless tobacco: Never Used     Comment: smokes 6-7 cigarettes per day  . Alcohol Use: No   OB History    Gravida Para Term Preterm AB TAB SAB Ectopic Multiple Living   3 2 2  0 1  1   2      Review of Systems  Constitutional: Negative for fever, chills and fatigue.  Respiratory: Negative for cough, chest tightness, shortness of breath and wheezing.   Cardiovascular: Negative for chest pain and palpitations.  Gastrointestinal: Positive for nausea, vomiting, abdominal pain and diarrhea. Negative for constipation, blood in stool and anal bleeding.      Allergies  Bee venom; Penicillins; Cephalexin; Doxycycline; Dust mite  extract; Erythromycin; Ibuprofen; Morphine and related; Sulfonamide derivatives; and Tramadol  Home Medications   Prior to Admission medications   Medication Sig Start Date End Date Taking? Authorizing Provider  aspirin 81 MG chewable tablet Chew 81 mg by mouth daily. Daily dose is 1 tab, takes 4 tabs if feeling chest pain or pressure   Yes Historical Provider, MD  cholecalciferol (VITAMIN D) 1000 UNITS tablet Take 1,000 Units by mouth daily.   Yes Historical Provider, MD  cyclobenzaprine (FLEXERIL) 10 MG tablet Take 10 mg by mouth 3 (three) times daily as needed for muscle spasms.    Yes Historical Provider, MD  hydrochlorothiazide (HYDRODIURIL) 25 MG tablet Take 12.5 mg by mouth 2 (two) times daily as needed (Blood pressure rises).     Yes Historical Provider, MD  hydrocortisone (CORTEF) 10 MG tablet Take 10-20 mg by mouth 2 (two) times daily. Pt takes 20 mg in the morning and 10 mg at night   Yes Historical Provider, MD  levothyroxine (SYNTHROID, LEVOTHROID) 75 MCG tablet Take 75 mcg by mouth daily before breakfast.   Yes Historical Provider, MD  LORazepam (ATIVAN) 2 MG tablet Take 2 mg by mouth every 6 (six) hours as needed for anxiety.   Yes Historical Provider, MD  meclizine (ANTIVERT) 25 MG tablet Take 1 tablet (25 mg total) by mouth 4 (four) times daily as needed for dizziness. 06/15/13  Yes Janice Norrie, MD  metoCLOPramide (REGLAN) 10 MG tablet Take 20 mg by mouth 3 (three) times daily. Runny stools   Yes Historical Provider, MD  nitroGLYCERIN (NITROSTAT) 0.4 MG SL tablet Place 0.4 mg under the tongue every 5 (five) minutes as needed for chest pain.   Yes Historical Provider, MD  omega-3 acid ethyl esters (LOVAZA) 1 G capsule Take 1 g by mouth 3 (three) times daily.   Yes Historical Provider, MD  ondansetron (ZOFRAN) 4 MG tablet Take 4-8 mg by mouth every 4 (four) hours as needed for nausea.    Yes Historical Provider, MD  oxycodone (ROXICODONE) 30 MG immediate release tablet Take 30 mg by mouth every 6 (six) hours as needed for pain.    Yes Historical Provider, MD  pramipexole (MIRAPEX) 0.125 MG tablet Take 0.125 mg by mouth at bedtime as needed (restles leg syndrome).   Yes Historical Provider, MD  PROAIR HFA 108 (90 BASE) MCG/ACT inhaler Inhale 2 puffs into the lungs every 6 (six) hours as needed. 07/08/14  Yes Historical Provider, MD  zolpidem (AMBIEN) 10 MG tablet Take 10 mg by mouth at bedtime.  03/20/14  Yes Historical Provider, MD  promethazine (PHENERGAN) 25 MG suppository Place 1 suppository (25 mg total) rectally every 6 (six) hours as needed for nausea or vomiting. 08/04/14   Lanayah Gartley A Forcucci, PA-C   BP 139/96 mmHg  Pulse 84  Temp(Src) 98.2 F (36.8 C)  Resp 18  Ht 5\' 5"  (1.651 m)  Wt 183 lb (83.008 kg)   BMI 30.45 kg/m2  SpO2 96% Physical Exam  Constitutional: She is oriented to person, place, and time. She appears well-developed and well-nourished.  HENT:  Head: Normocephalic and atraumatic.  Mouth/Throat: Oropharynx is clear and moist. No oropharyngeal exudate.  Eyes: Conjunctivae and EOM are normal. Pupils are equal, round, and reactive to light. No scleral icterus.  Neck: Normal range of motion. Neck supple. No JVD present. No thyromegaly present.  Cardiovascular: Normal rate, regular rhythm, normal heart sounds and intact distal pulses.  Exam reveals no gallop and no friction rub.  No murmur heard. Pulmonary/Chest: Effort normal and breath sounds normal. No respiratory distress. She has no wheezes. She has no rales. She exhibits no tenderness.  Abdominal: Soft. Normal appearance and bowel sounds are normal. She exhibits no distension and no mass. There is tenderness in the right lower quadrant and suprapubic area. There is no rigidity, no rebound, no guarding, no tenderness at McBurney's point and negative Murphy's sign.  Musculoskeletal: Normal range of motion.  Lymphadenopathy:    She has no cervical adenopathy.  Neurological: She is alert and oriented to person, place, and time. She has normal strength. No cranial nerve deficit or sensory deficit. Coordination normal.  Skin: Skin is warm and dry. She is not diaphoretic.  Psychiatric: She has a normal mood and affect. Her behavior is normal. Judgment and thought content normal.  Nursing note and vitals reviewed.   ED Course  Procedures (including critical care time) Labs Review Labs Reviewed  CBC WITH DIFFERENTIAL - Abnormal; Notable for the following:    WBC 14.6 (*)    RBC 5.43 (*)    Hemoglobin 16.3 (*)    HCT 46.9 (*)    Neutro Abs 10.0 (*)    All other components within normal limits  COMPREHENSIVE METABOLIC PANEL - Abnormal; Notable for the following:    Potassium 3.5 (*)    Glucose, Bld 105 (*)    Anion gap 17 (*)     All other components within normal limits  URINALYSIS, ROUTINE W REFLEX MICROSCOPIC - Abnormal; Notable for the following:    Color, Urine AMBER (*)    Specific Gravity, Urine 1.031 (*)    Bilirubin Urine SMALL (*)    Ketones, ur 15 (*)    All other components within normal limits  LIPASE, BLOOD  POC URINE PREG, ED    Imaging Review Dg Abd 2 Views  08/04/2014   CLINICAL DATA:  Nausea, vomiting and diarrhea for 2 days. Recent colonoscopy. Abdominal pain.  EXAM: ABDOMEN - 2 VIEW  COMPARISON:  CT 05/11/2014  FINDINGS: Negative for free air. Gas-filled loops of small and large bowel. Nonobstructive bowel gas pattern. Cholecystectomy clips in the right abdomen. Phleboliths in the pelvis.  IMPRESSION: No acute findings.   Electronically Signed   By: Markus Daft M.D.   On: 08/04/2014 14:04     EKG Interpretation None      MDM   Final diagnoses:  Abdominal pain  Chronic diarrhea  Non-intractable vomiting with nausea, vomiting of unspecified type   Patient is a 48 year old female who presents to the emergency room with complaint of abdominal pain, nausea, vomiting, and diarrhea. Physical exam reveals alert and nontoxic appearing female without apparent distress. Abdomen is soft and mildly tender on examination. Patient reports rebound on initial exam, but repeat exam reveals no rebound, guarding, or rigidity. CBC reveals mild leukocytosis. CMP is unremarkable. Lipase is negative. UA is negative. Urine pregnancy is negative.  Abdominal x-rays negative for acute perforations. Patient recently had CT scan of the abdomen on September 5 when she came in for similar symptoms. CT scan at that time was negative. Patient was given Solu-Cortef here for her adrenal insufficiency, fluids, and IV Zofran. She improved significantly with pain medication. Patient was able to tolerate fluids here. She has pain medication at home. We'll discharge her home with Phenergan suppositories. Patient is to return for  intractable nausea and vomiting, right lower quadrant abdominal pain that is intractable despite taking her oxycodone 30 mg, or any other concerning symptoms. She  states understanding and agreement at this time. Patient has been discussed with Dr. Betsey Holiday who agrees with the above workup and plan. Patient is stable for discharge.    Cherylann Parr, PA-C 08/04/14 [place], MD 08/04/14 1517

## 2014-08-10 IMAGING — CR DG CHEST 2V
2 series · 2 of 2 positions shown · non-contrast
Comparison: August 23, 2013

CLINICAL DATA: Pleural effusion, fever, chills

EXAM:
CHEST  2 VIEW

[view not recorded (1 of 2)]
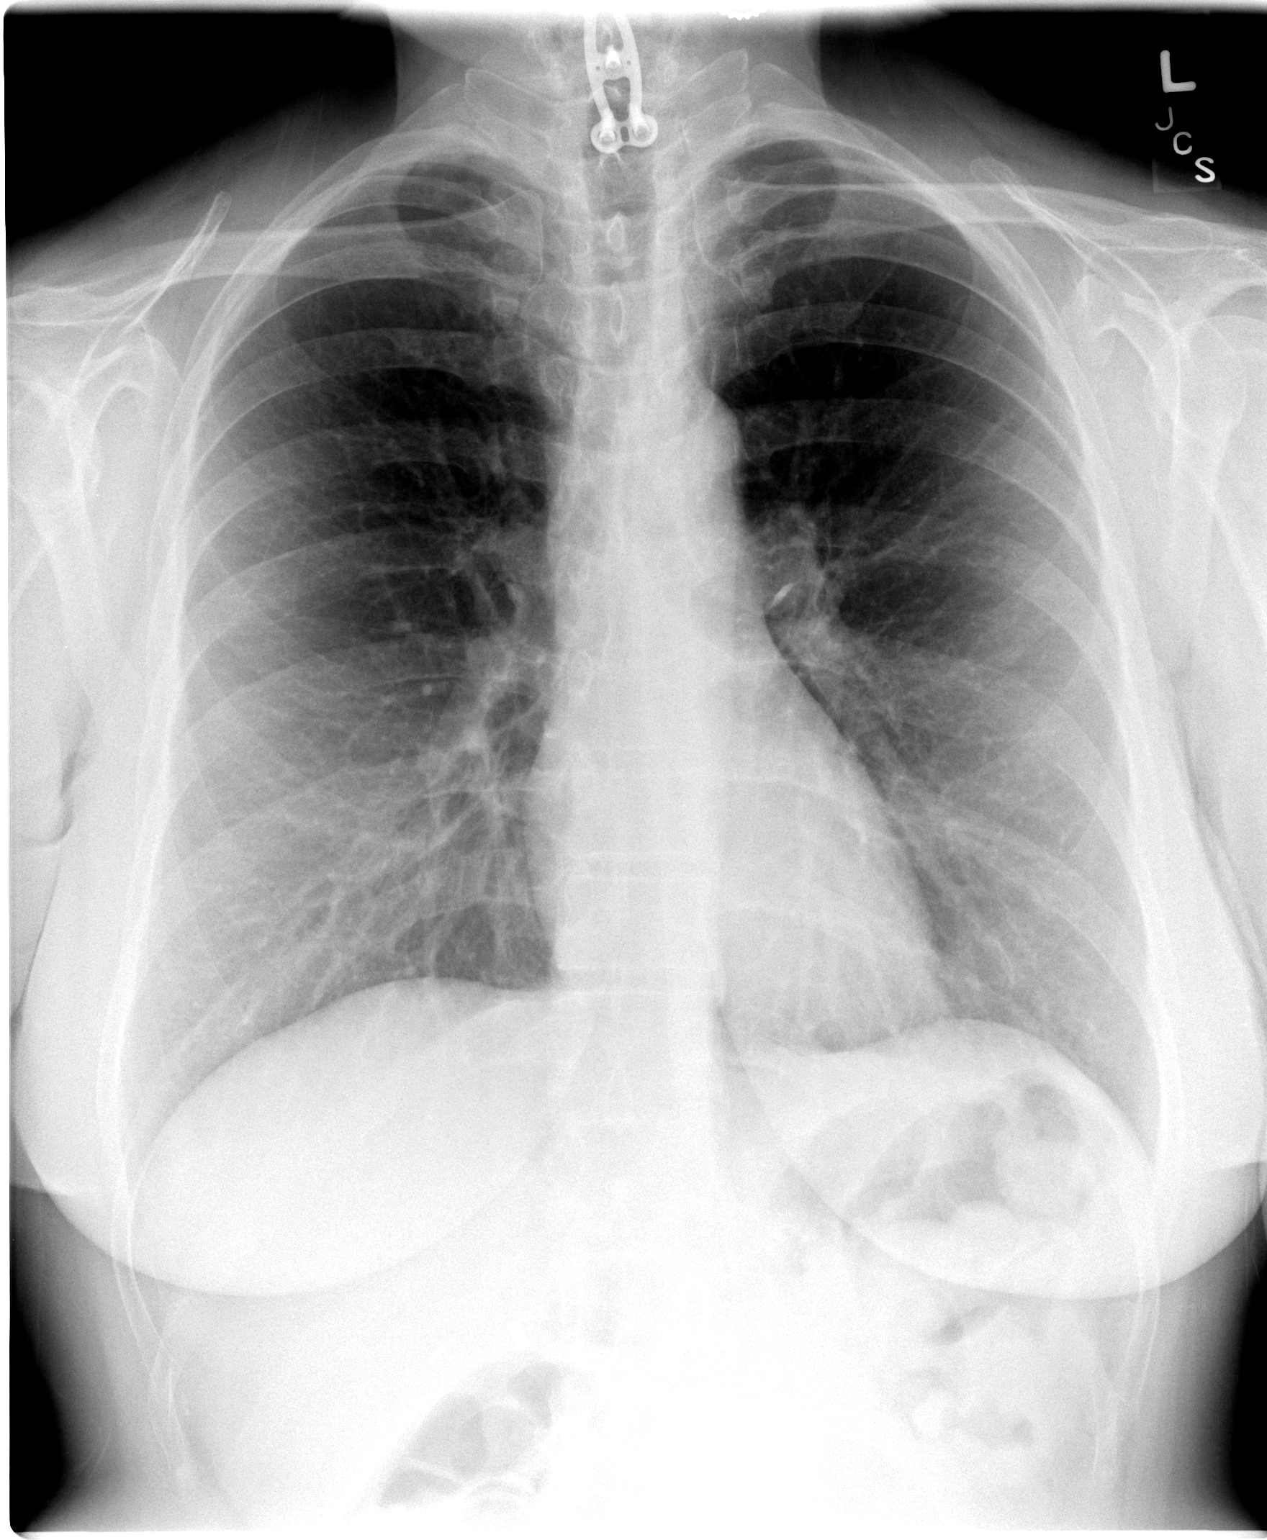

[view not recorded (2 of 2)]
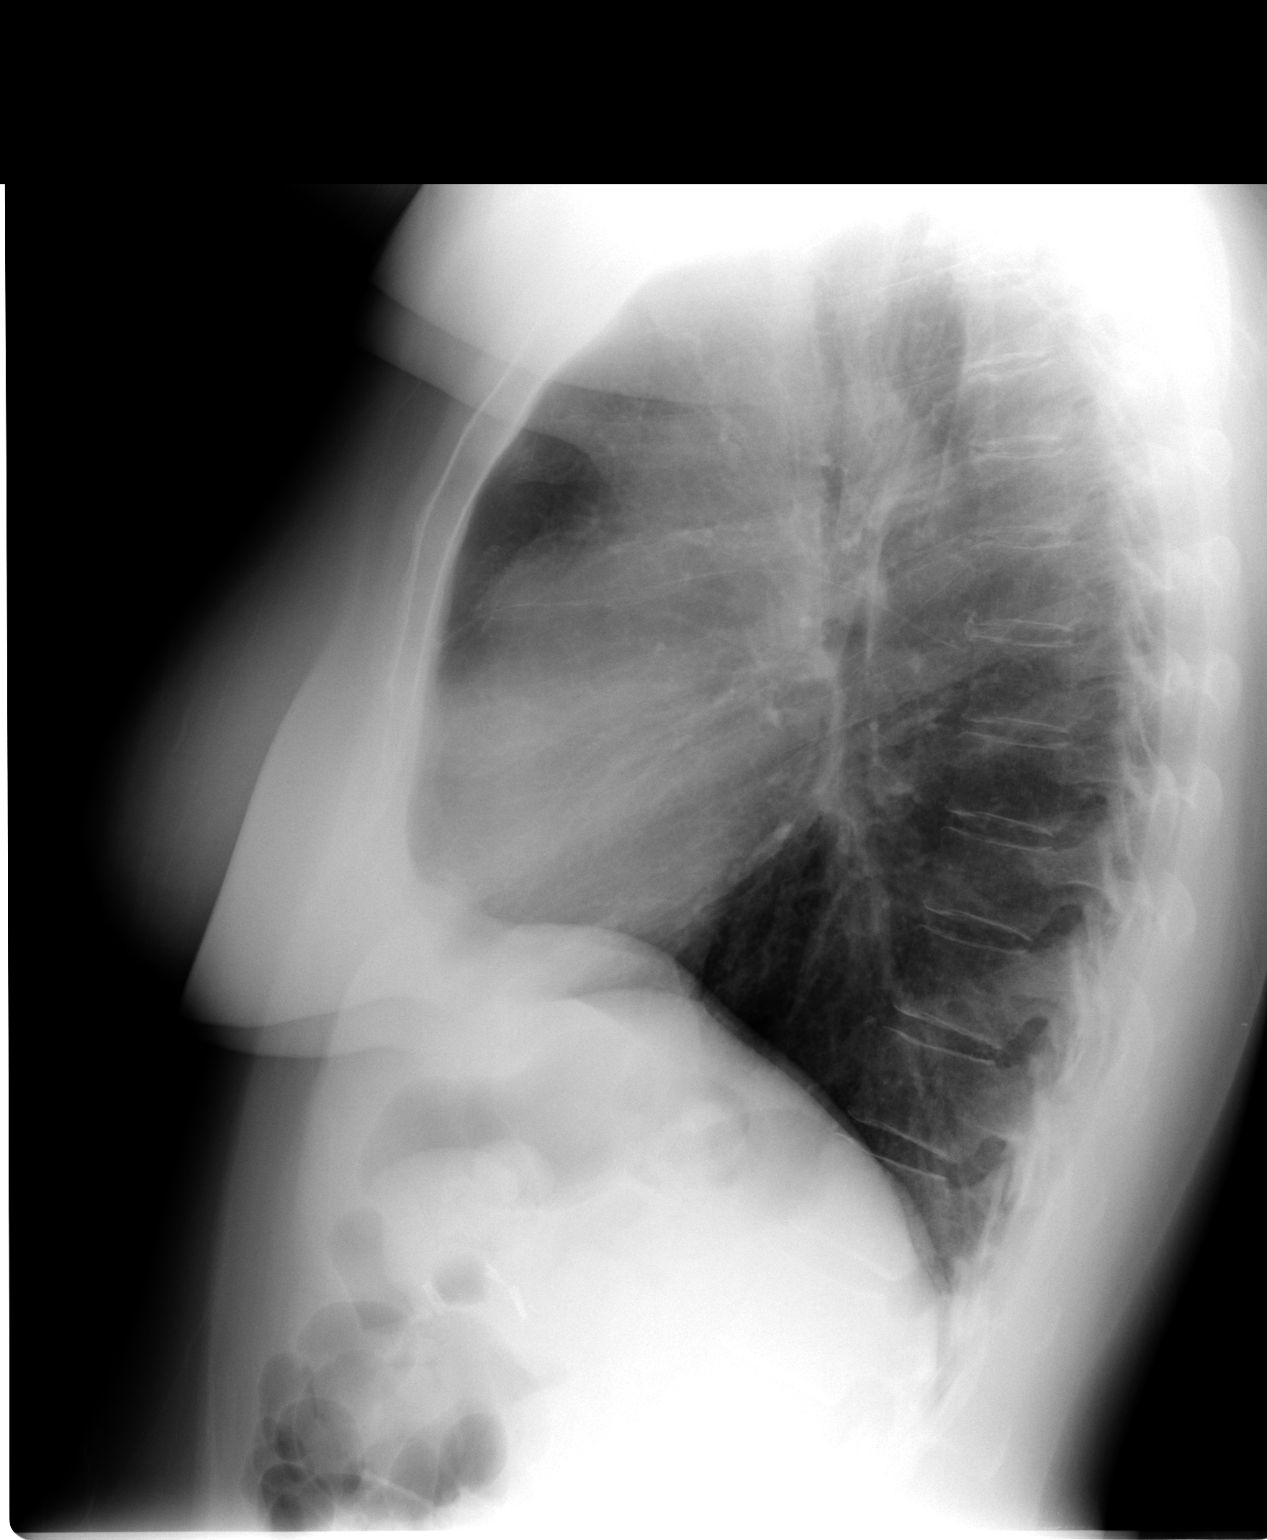

[2 of 2 positions shown; findings below may reference images not displayed]

FINDINGS: The heart size and mediastinal contours are within normal limits.
There is no focal infiltrate, pulmonary edema, or pleural effusion.
The visualized skeletal structures are stable. Prior cholecystectomy
clips are identified.
IMPRESSION: No active cardiopulmonary disease.

## 2014-08-12 DIAGNOSIS — G4700 Insomnia, unspecified: Secondary | ICD-10-CM | POA: Diagnosis not present

## 2014-08-12 DIAGNOSIS — Z6828 Body mass index (BMI) 28.0-28.9, adult: Secondary | ICD-10-CM | POA: Diagnosis not present

## 2014-08-12 DIAGNOSIS — Z23 Encounter for immunization: Secondary | ICD-10-CM | POA: Diagnosis not present

## 2014-08-12 DIAGNOSIS — G894 Chronic pain syndrome: Secondary | ICD-10-CM | POA: Diagnosis not present

## 2014-08-15 ENCOUNTER — Encounter (HOSPITAL_COMMUNITY): Payer: Self-pay | Admitting: Cardiology

## 2014-09-09 DIAGNOSIS — G894 Chronic pain syndrome: Secondary | ICD-10-CM | POA: Diagnosis not present

## 2014-09-09 DIAGNOSIS — M1991 Primary osteoarthritis, unspecified site: Secondary | ICD-10-CM | POA: Diagnosis not present

## 2014-09-09 DIAGNOSIS — Z6830 Body mass index (BMI) 30.0-30.9, adult: Secondary | ICD-10-CM | POA: Diagnosis not present

## 2014-09-09 DIAGNOSIS — J0100 Acute maxillary sinusitis, unspecified: Secondary | ICD-10-CM | POA: Diagnosis not present

## 2014-10-04 DIAGNOSIS — G894 Chronic pain syndrome: Secondary | ICD-10-CM | POA: Diagnosis not present

## 2014-10-04 DIAGNOSIS — M1991 Primary osteoarthritis, unspecified site: Secondary | ICD-10-CM | POA: Diagnosis not present

## 2014-10-04 DIAGNOSIS — J0100 Acute maxillary sinusitis, unspecified: Secondary | ICD-10-CM | POA: Diagnosis not present

## 2014-10-04 DIAGNOSIS — I10 Essential (primary) hypertension: Secondary | ICD-10-CM | POA: Diagnosis not present

## 2014-10-04 DIAGNOSIS — J209 Acute bronchitis, unspecified: Secondary | ICD-10-CM | POA: Diagnosis not present

## 2014-10-04 DIAGNOSIS — Z6828 Body mass index (BMI) 28.0-28.9, adult: Secondary | ICD-10-CM | POA: Diagnosis not present

## 2014-11-06 DIAGNOSIS — E663 Overweight: Secondary | ICD-10-CM | POA: Diagnosis not present

## 2014-11-06 DIAGNOSIS — H8113 Benign paroxysmal vertigo, bilateral: Secondary | ICD-10-CM | POA: Diagnosis not present

## 2014-11-06 DIAGNOSIS — Z6828 Body mass index (BMI) 28.0-28.9, adult: Secondary | ICD-10-CM | POA: Diagnosis not present

## 2014-11-06 DIAGNOSIS — J019 Acute sinusitis, unspecified: Secondary | ICD-10-CM | POA: Diagnosis not present

## 2014-11-06 DIAGNOSIS — M1991 Primary osteoarthritis, unspecified site: Secondary | ICD-10-CM | POA: Diagnosis not present

## 2014-12-06 DIAGNOSIS — G894 Chronic pain syndrome: Secondary | ICD-10-CM | POA: Diagnosis not present

## 2014-12-06 DIAGNOSIS — F419 Anxiety disorder, unspecified: Secondary | ICD-10-CM | POA: Diagnosis not present

## 2014-12-06 DIAGNOSIS — Z6829 Body mass index (BMI) 29.0-29.9, adult: Secondary | ICD-10-CM | POA: Diagnosis not present

## 2014-12-06 DIAGNOSIS — E663 Overweight: Secondary | ICD-10-CM | POA: Diagnosis not present

## 2014-12-06 IMAGING — CT CT HEAD W/O CM
1 series · 16 of 30 positions shown, 20 images · non-contrast
Comparison: CT brain scan of 06/14/2013

CLINICAL DATA: Dizziness, giddiness

EXAM:
CT HEAD WITHOUT CONTRAST
TECHNIQUE: Contiguous axial images were obtained from the base of the skull
through the vertex without intravenous contrast.

[Series 2: headseq 4.8 h37s · axial · 0.43mm/px · z∈[+159,+316]mm · 16 of 36 slices shown, 20 images]
[im 2/36  brain]
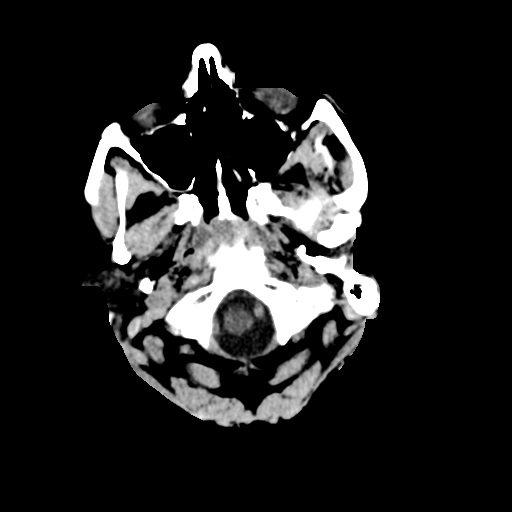
[im 2/36  bone]
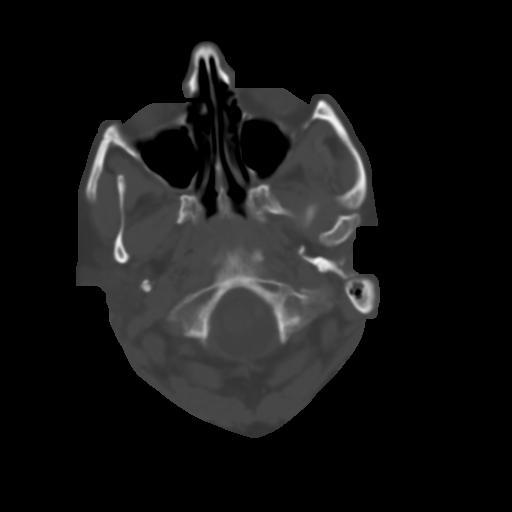
[im 4/36  brain]
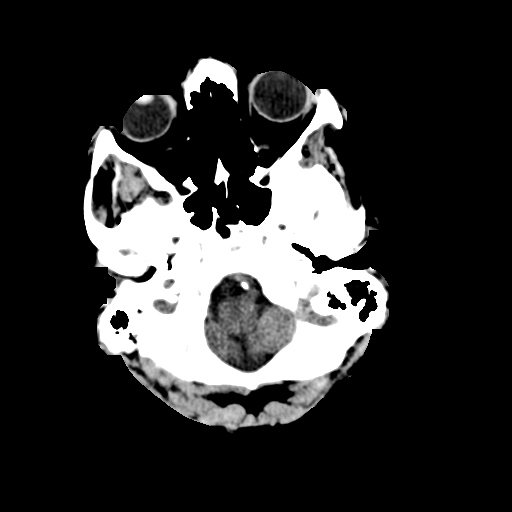
[im 7/36  brain]
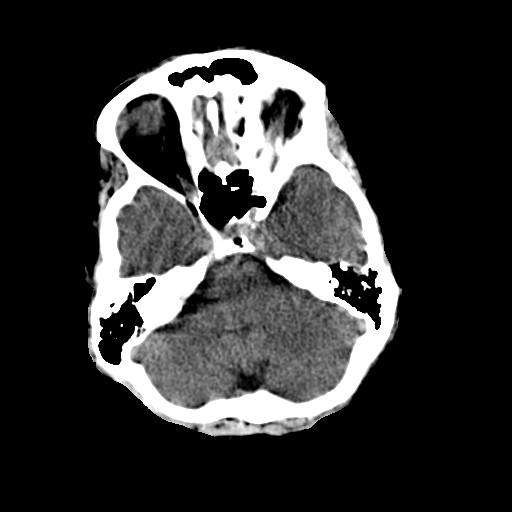
[im 9/36  brain]
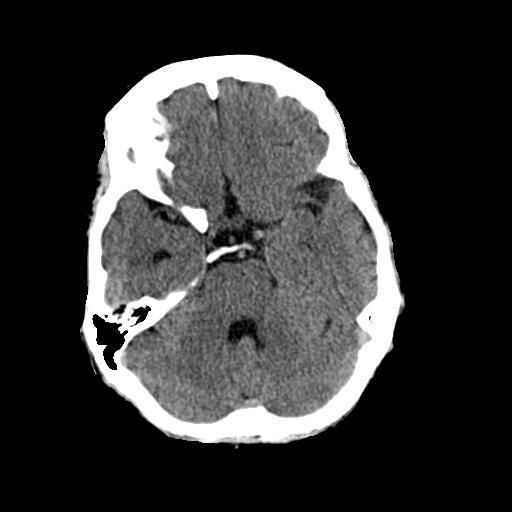
[im 10/36  brain]
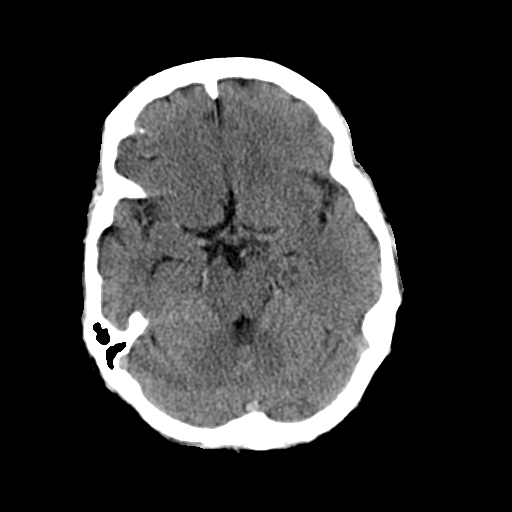
[im 10/36  bone]
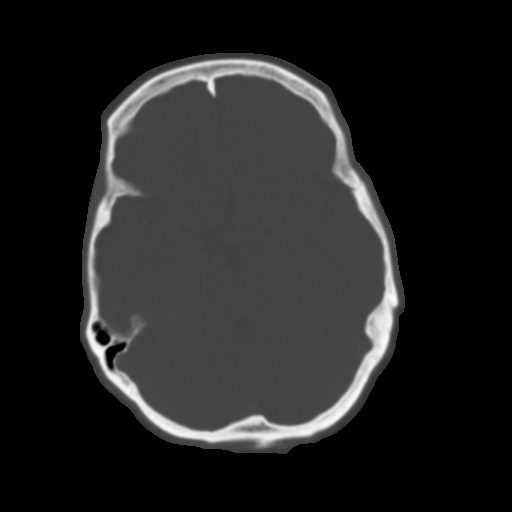
[im 13/36  brain]
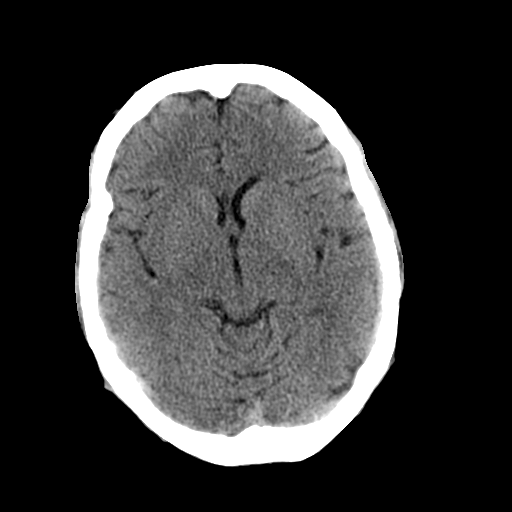
[im 15/36  brain]
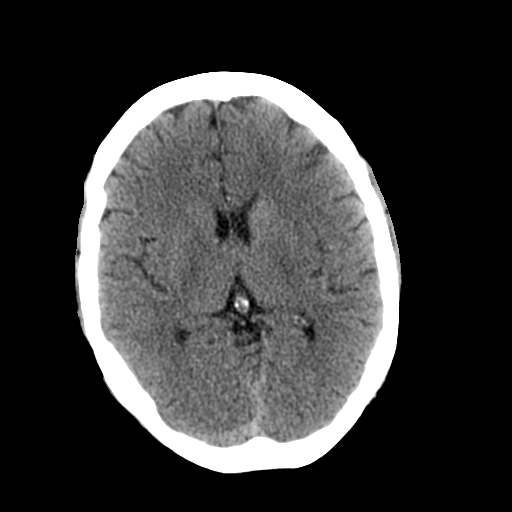
[im 17/36  brain]
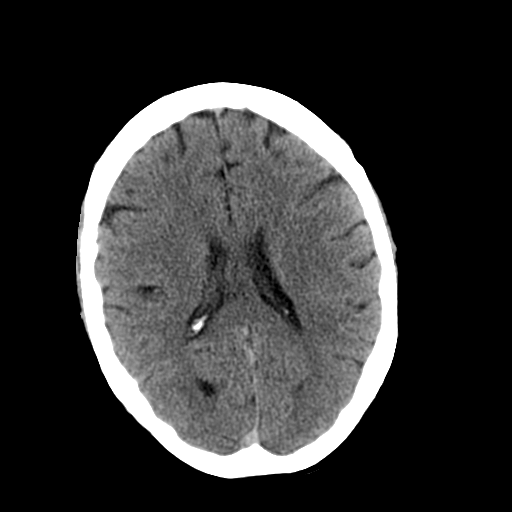
[im 19/36  brain]
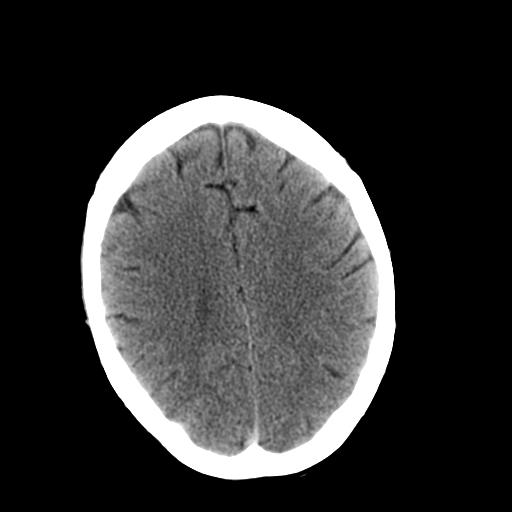
[im 19/36  bone]
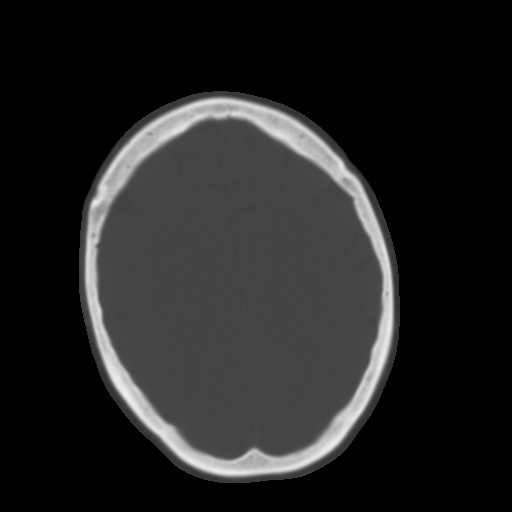
[im 21/36  brain]
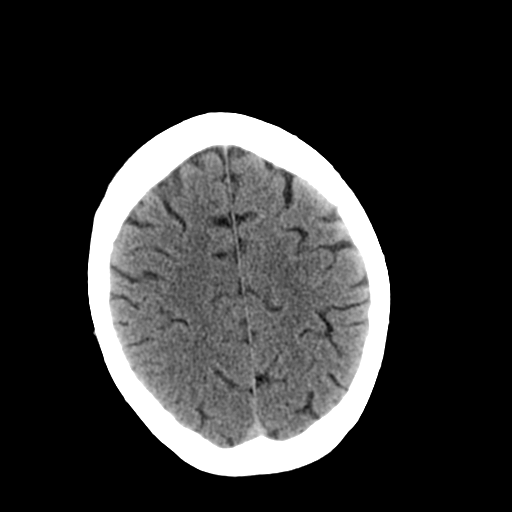
[im 23/36  brain]
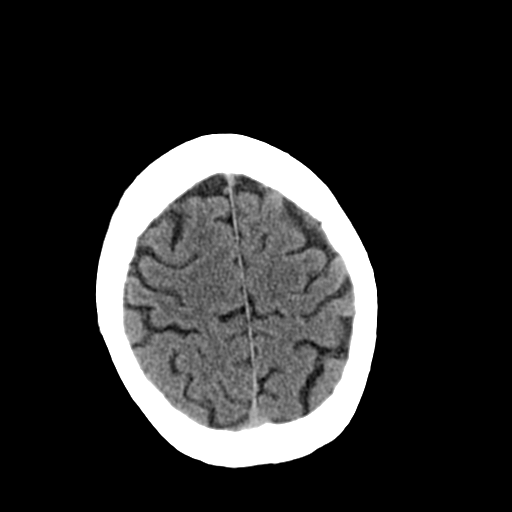
[im 26/36  brain]
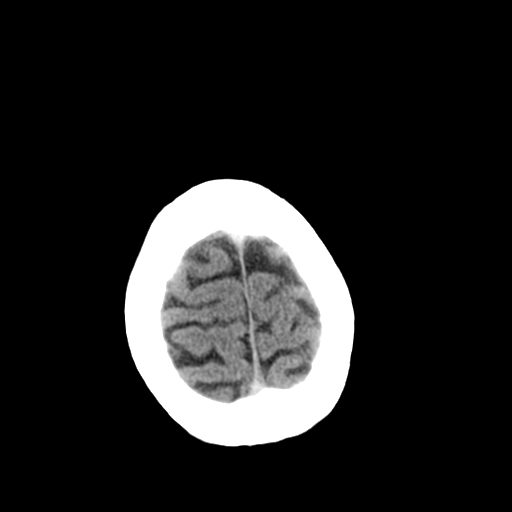
[im 27/36  brain]
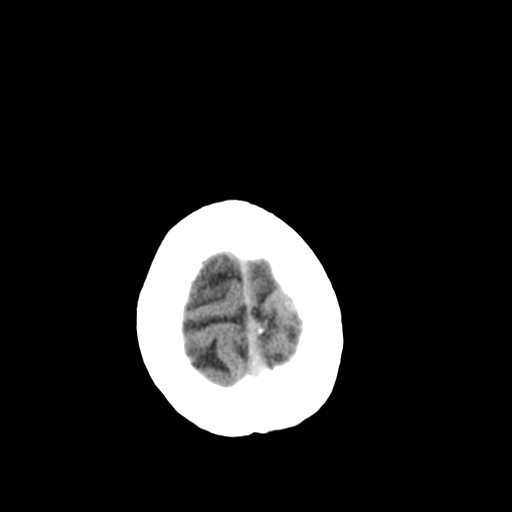
[im 27/36  bone]
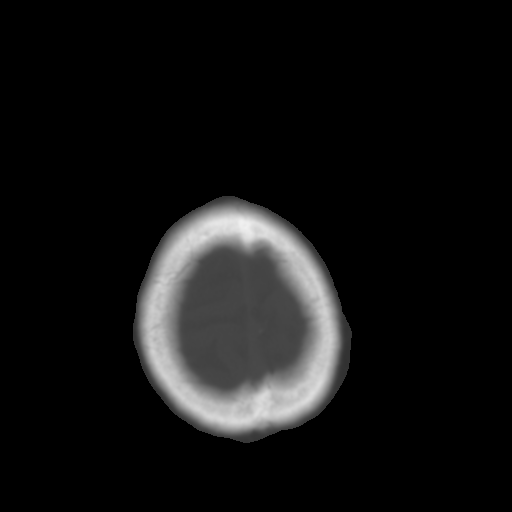
[im 29/36  brain]
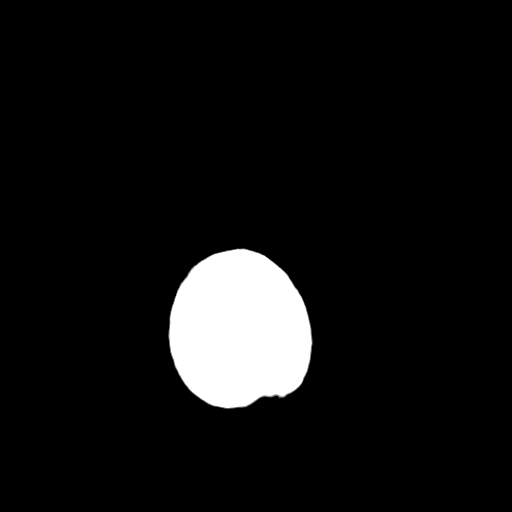
[im 32/36  brain]
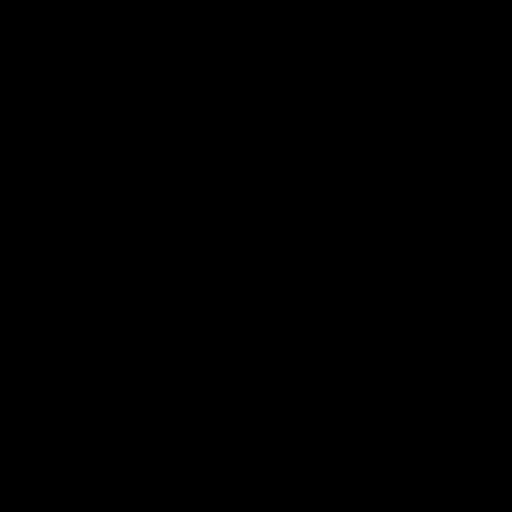
[im 34/36  brain]
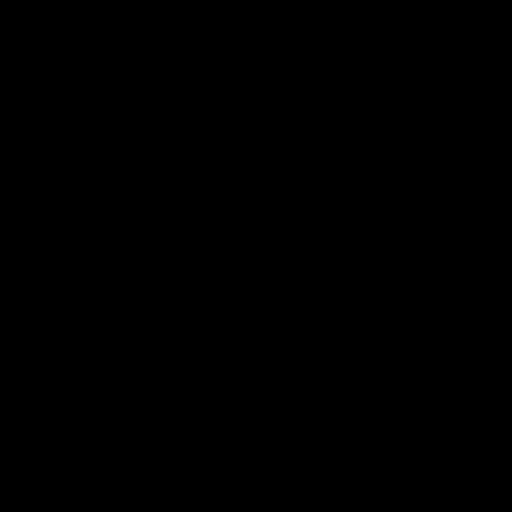

[16 of 30 positions shown; findings below may reference images not displayed]

FINDINGS: The ventricular system is stable in size and configuration, and the
septum remains in a normal midline position. The fourth ventricle
and basilar cisterns are unchanged. No hemorrhage, mass lesion, or
acute infarction is seen. On bone window images, No calvarial
abnormality is noted. The paranasal sinuses appear clear.
IMPRESSION: Negative unenhanced CT of the brain.

## 2014-12-08 IMAGING — CR DG KNEE COMPLETE 4+V*R*
4 series · 4 of 4 positions shown · non-contrast
Comparison: None.

CLINICAL DATA: Dizziness

EXAM:
RIGHT KNEE - COMPLETE 4+ VIEW

[t knee ap right]
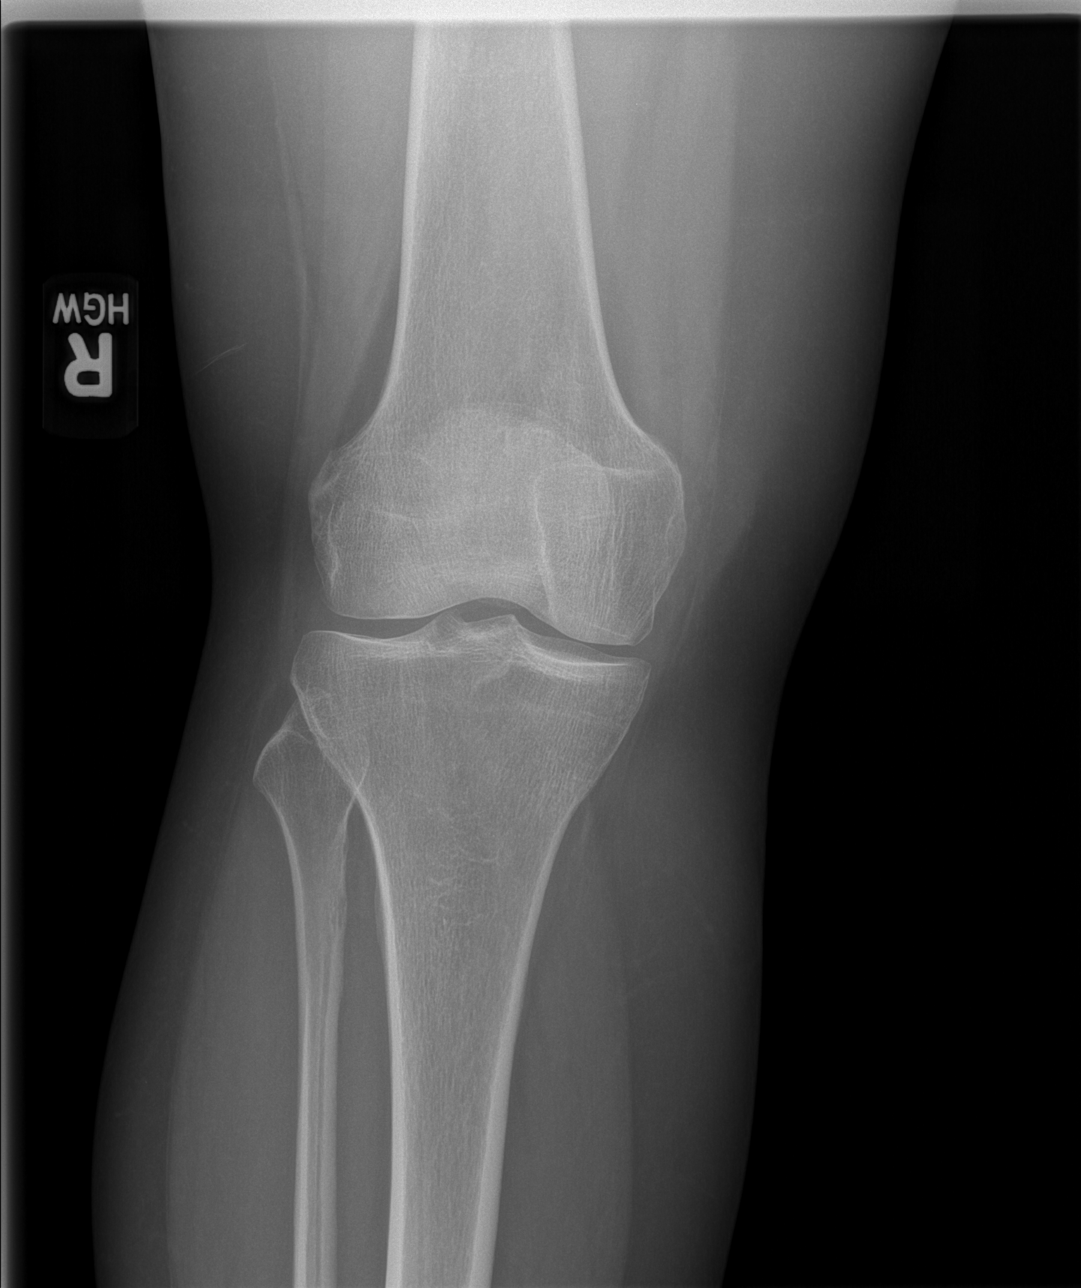

[t knee obl right (1 of 2)]
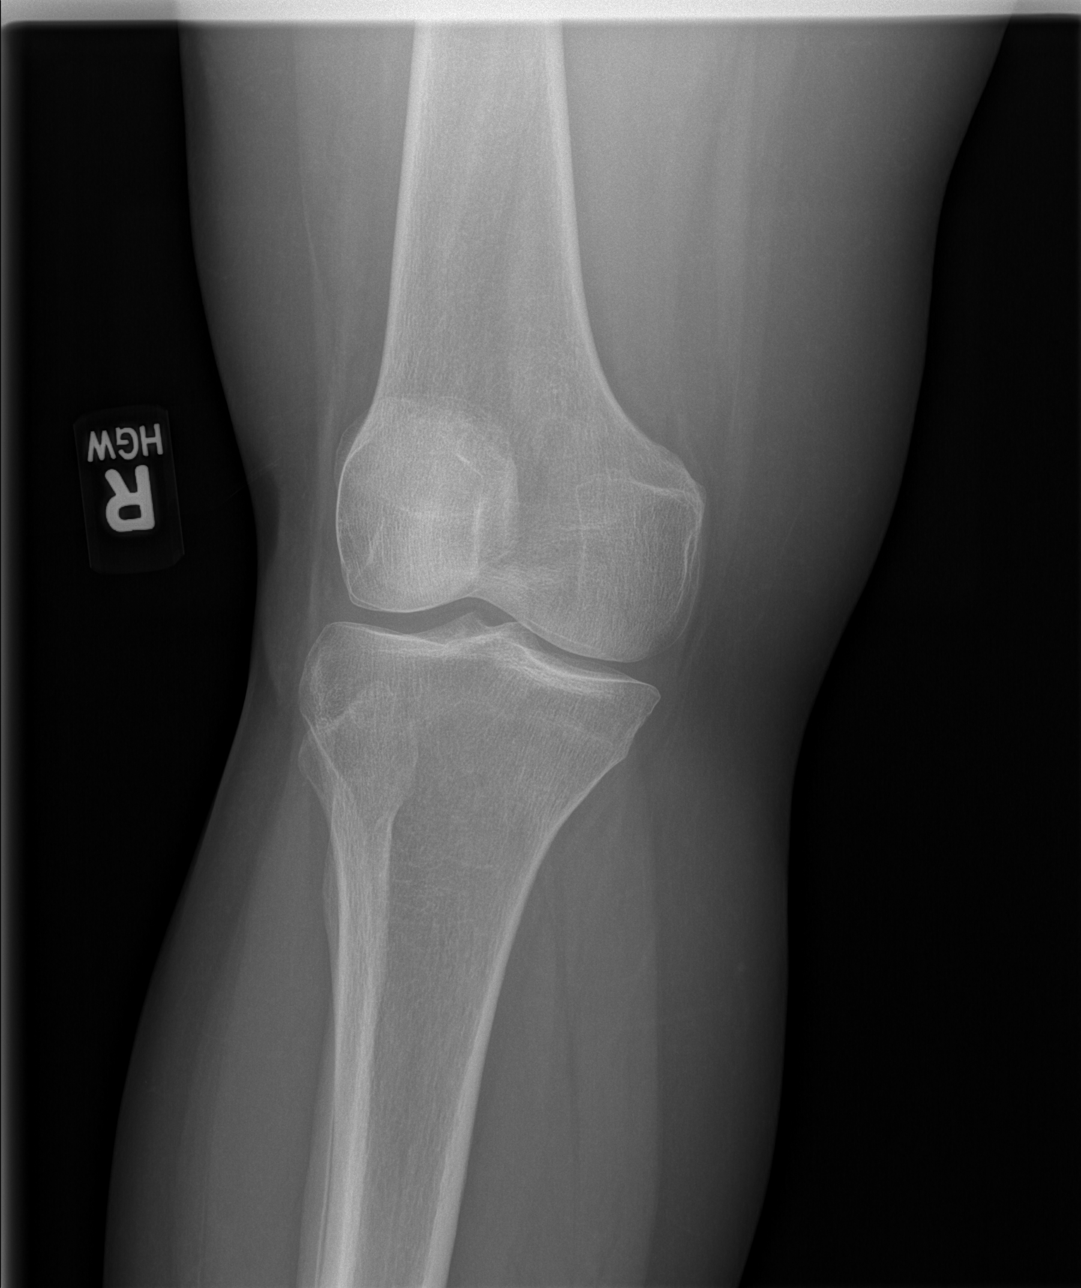

[t knee obl right (2 of 2)]
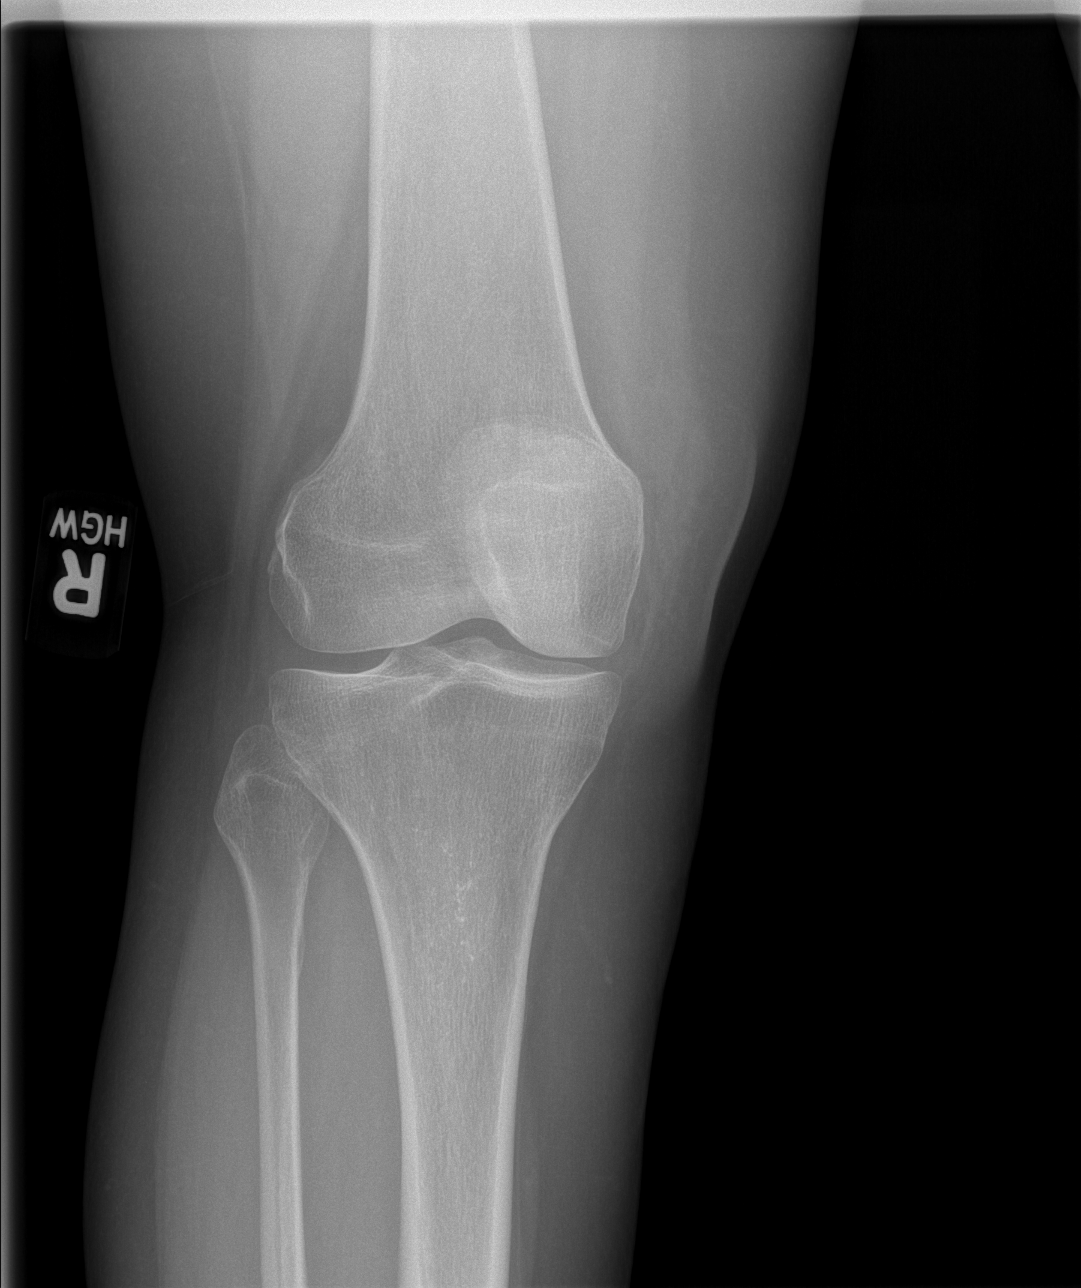

[t knee lat right]
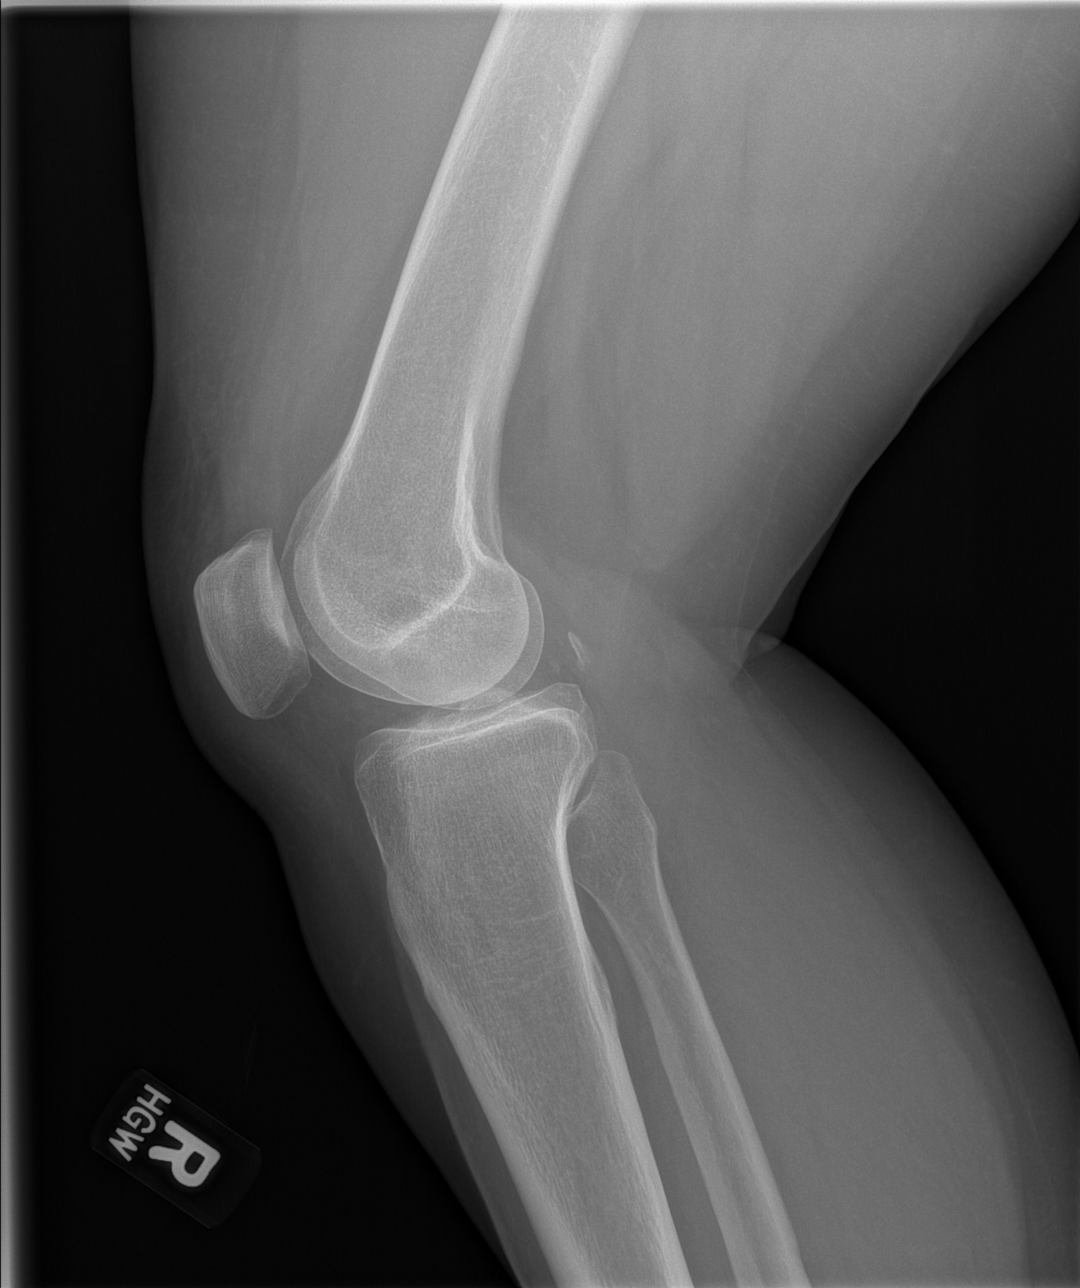

[4 of 4 positions shown; findings below may reference images not displayed]

FINDINGS: There is no evidence of fracture, dislocation, or joint effusion.
There is no evidence of arthropathy or other focal bone abnormality.
Soft tissues are unremarkable.
IMPRESSION: Negative.

## 2015-01-20 IMAGING — CT CT ABD-PELV W/ CM
2 of 5 series · 11 of 46 positions shown, 12 images · IV contrast (Iodine)
Comparison: 04/17/2010

CLINICAL DATA: Abdominal pain, headache, nausea and vomiting. Right
lower quadrant pain.

EXAM:
CT ABDOMEN AND PELVIS WITH CONTRAST
TECHNIQUE: Multidetector CT imaging of the abdomen and pelvis was performed
using the standard protocol following bolus administration of
intravenous contrast.
CONTRAST:  100mL OMNIPAQUE IOHEXOL 300 MG/ML  SOLN

[Series 201: routine, idose (2) · axial · 0.73mm/px · z∈[-626,-286]mm · 8 of 88 slices shown, 9 images]
[im 10/88  soft-tissue]
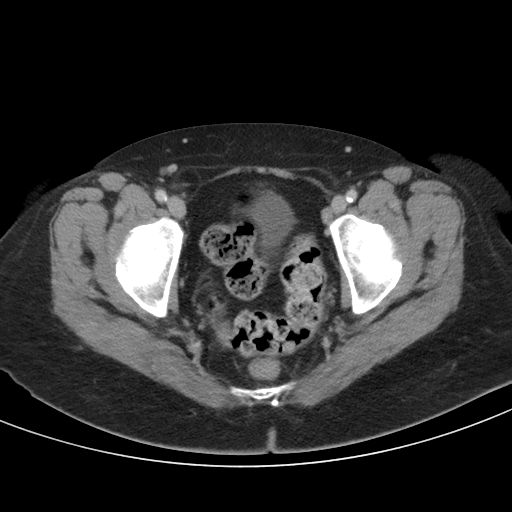
[im 10/88  bone]
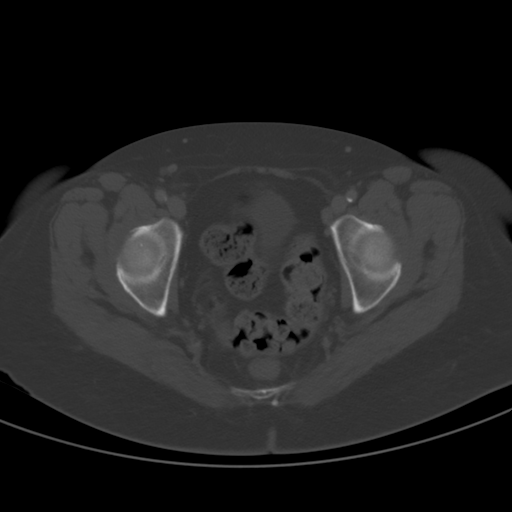
[im 19/88  soft-tissue]
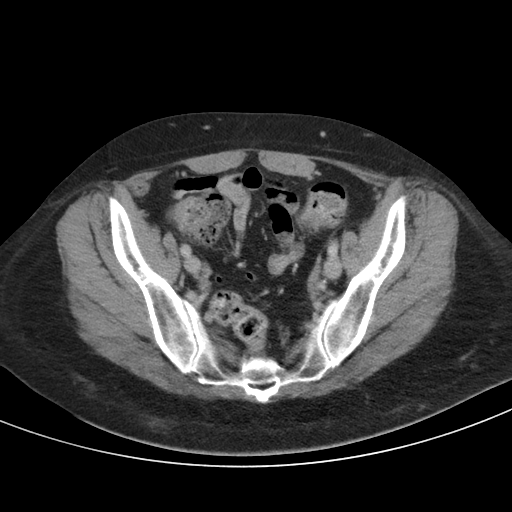
[im 28/88  soft-tissue]
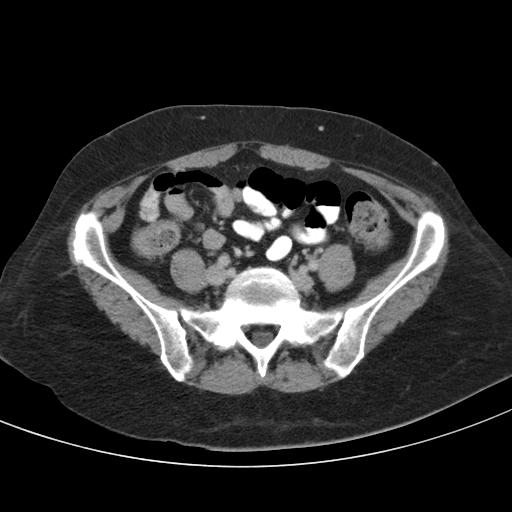
[im 37/88  soft-tissue]
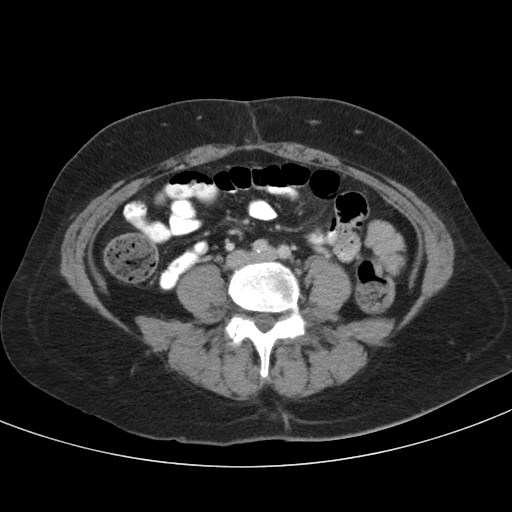
[im 51/88  soft-tissue]
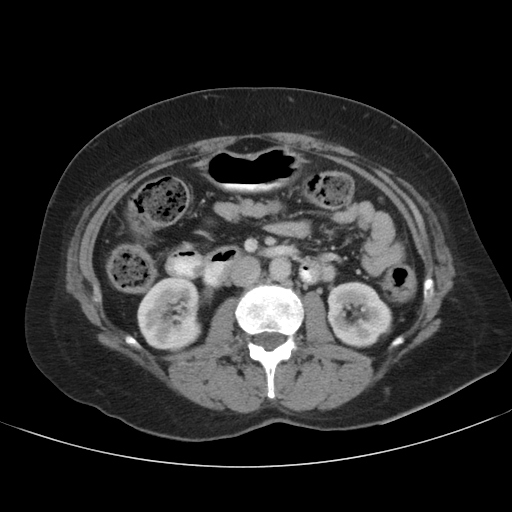
[im 60/88  soft-tissue]
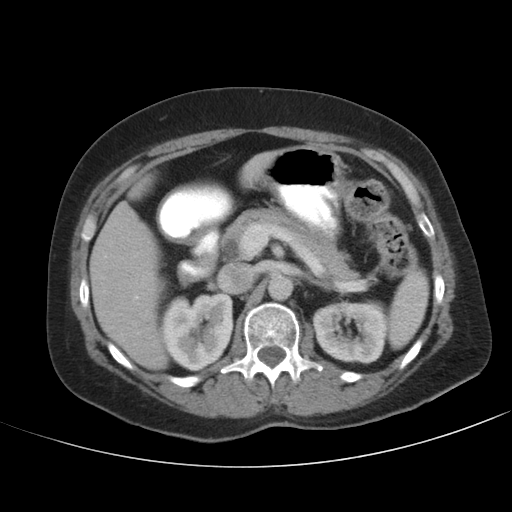
[im 69/88  soft-tissue]
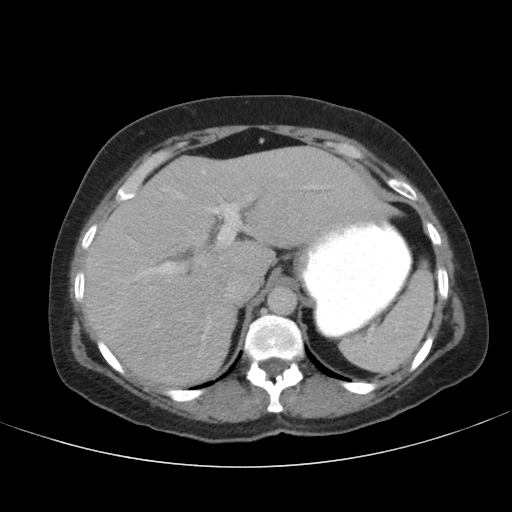
[im 78/88  soft-tissue]
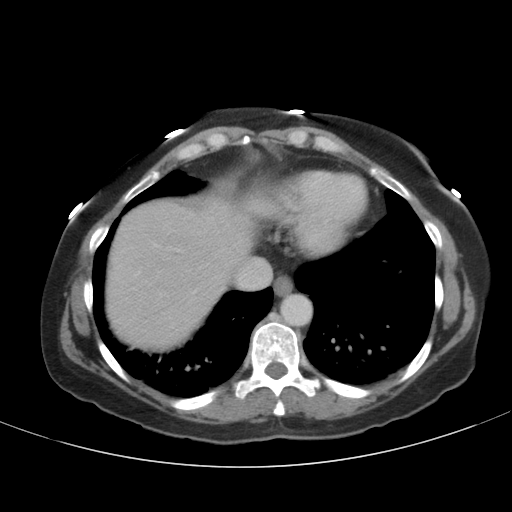

[Series 203: coronals, idose (2) · coronal · 0.45mm/px · 3 of 87 slices shown]
[im 29/87  soft-tissue]
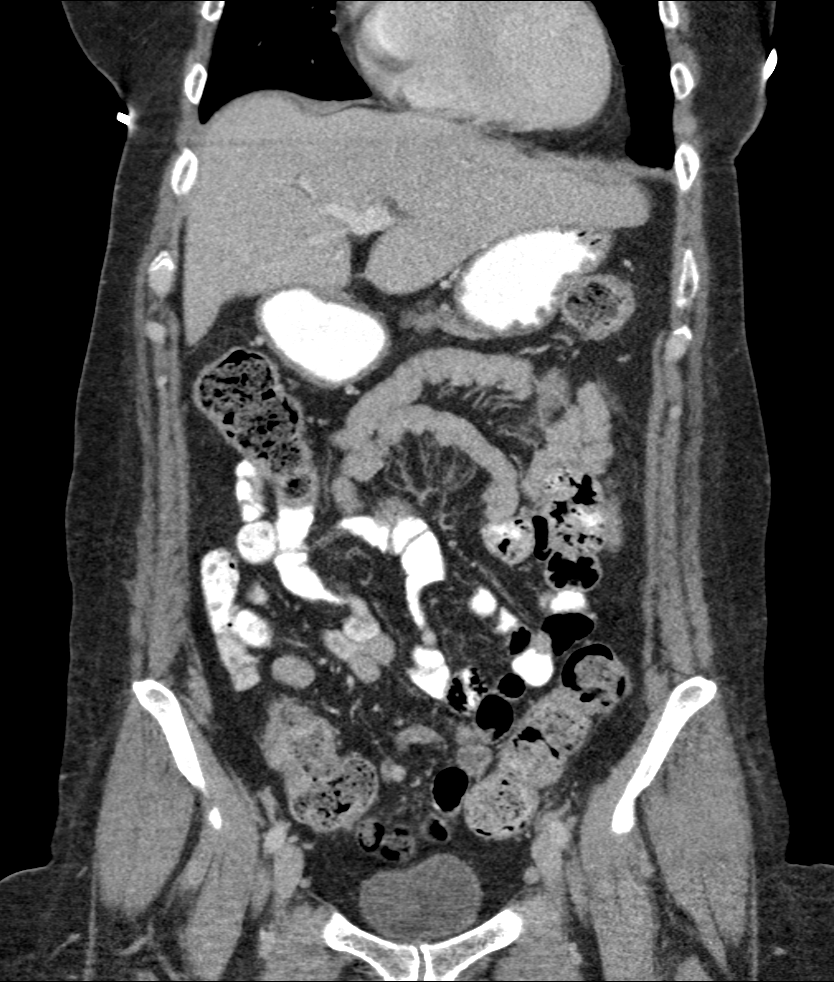
[im 39/87  soft-tissue]
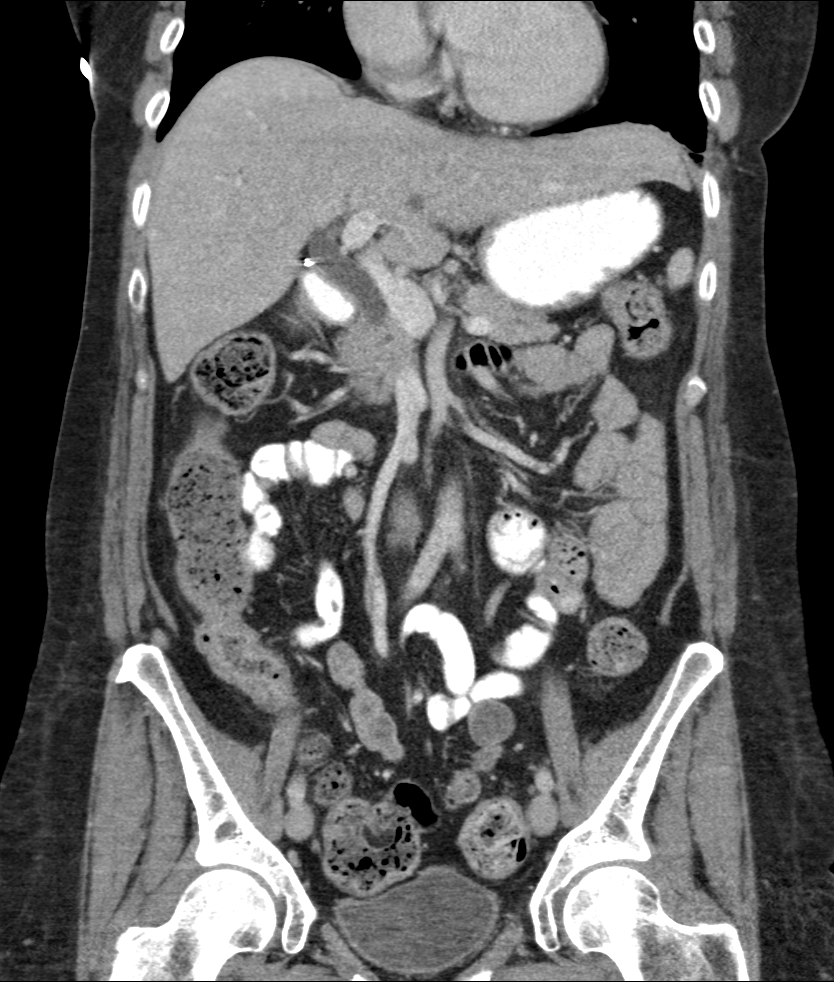
[im 48/87  soft-tissue]
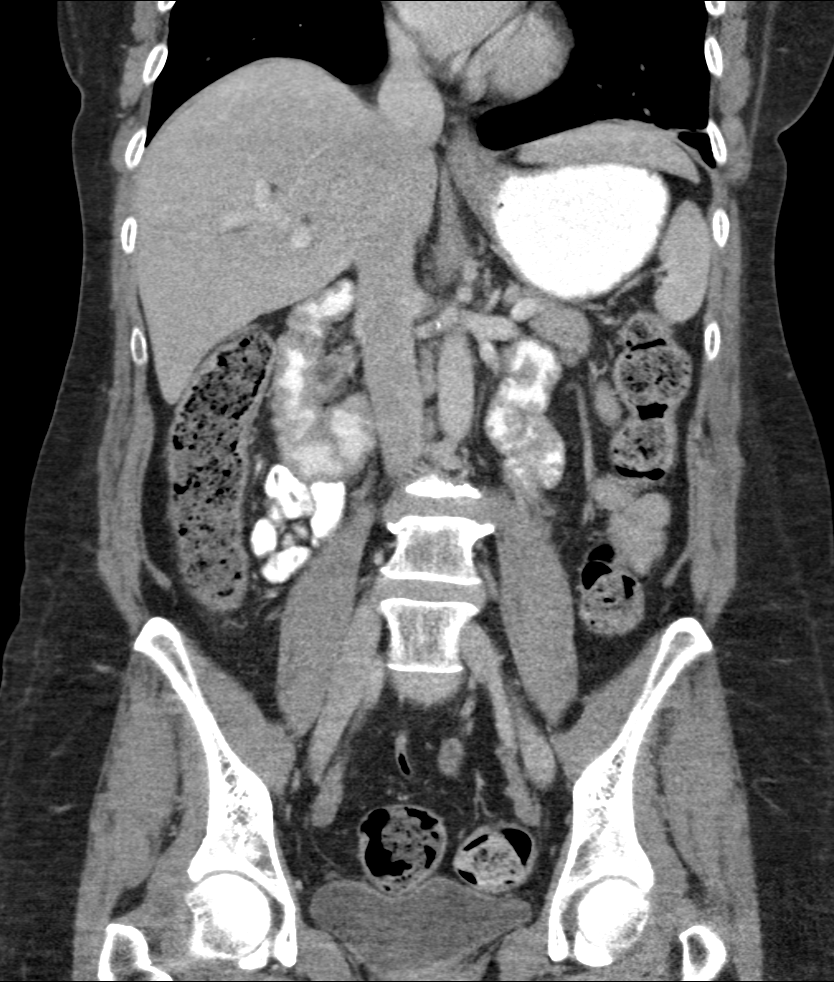

[11 of 46 positions shown; findings below may reference images not displayed]

FINDINGS: Atelectasis in the lung bases.

Surgical absence of the gallbladder. Bile ducts are mildly
prominent, likely normal for postoperative physiology. The liver,
spleen, pancreas, adrenal glands, kidneys, abdominal aorta, inferior
vena cava, and retroperitoneal lymph nodes are unremarkable.
Stomach, small bowel, colon are unremarkable for degree of
distention. No free air or free fluid in the abdomen.

Pelvis: The appendix is normal. No diverticulitis. Bladder wall is
not thickened. No pelvic mass or lymphadenopathy. No free or
loculated pelvic fluid collections. Mild degenerative changes in the
spine. No destructive bone lesions.
IMPRESSION: No focal acute process demonstrated in the abdomen or pelvis.
Appendix is normal. Surgical absence of the gallbladder. Physiologic
bile duct dilatation

## 2015-02-10 DIAGNOSIS — Z6831 Body mass index (BMI) 31.0-31.9, adult: Secondary | ICD-10-CM | POA: Diagnosis not present

## 2015-02-10 DIAGNOSIS — E782 Mixed hyperlipidemia: Secondary | ICD-10-CM | POA: Diagnosis not present

## 2015-02-10 DIAGNOSIS — E063 Autoimmune thyroiditis: Secondary | ICD-10-CM | POA: Diagnosis not present

## 2015-02-10 DIAGNOSIS — I10 Essential (primary) hypertension: Secondary | ICD-10-CM | POA: Diagnosis not present

## 2015-02-16 ENCOUNTER — Emergency Department (HOSPITAL_COMMUNITY): Payer: Medicare Other

## 2015-02-16 ENCOUNTER — Emergency Department (HOSPITAL_COMMUNITY)
Admission: EM | Admit: 2015-02-16 | Discharge: 2015-02-16 | Disposition: A | Discharge status: Home / Self Care | Payer: Medicare Other | Attending: Emergency Medicine | Admitting: Emergency Medicine

## 2015-02-16 ENCOUNTER — Encounter (HOSPITAL_COMMUNITY): Payer: Self-pay | Admitting: Emergency Medicine

## 2015-02-16 DIAGNOSIS — I10 Essential (primary) hypertension: Secondary | ICD-10-CM | POA: Insufficient documentation

## 2015-02-16 DIAGNOSIS — Z8719 Personal history of other diseases of the digestive system: Secondary | ICD-10-CM | POA: Diagnosis not present

## 2015-02-16 DIAGNOSIS — F419 Anxiety disorder, unspecified: Secondary | ICD-10-CM | POA: Diagnosis not present

## 2015-02-16 DIAGNOSIS — Z7952 Long term (current) use of systemic steroids: Secondary | ICD-10-CM | POA: Diagnosis not present

## 2015-02-16 DIAGNOSIS — I2510 Atherosclerotic heart disease of native coronary artery without angina pectoris: Secondary | ICD-10-CM | POA: Diagnosis not present

## 2015-02-16 DIAGNOSIS — Z720 Tobacco use: Secondary | ICD-10-CM | POA: Diagnosis not present

## 2015-02-16 DIAGNOSIS — S31829A Unspecified open wound of left buttock, initial encounter: Secondary | ICD-10-CM | POA: Diagnosis not present

## 2015-02-16 DIAGNOSIS — Z9889 Other specified postprocedural states: Secondary | ICD-10-CM | POA: Diagnosis not present

## 2015-02-16 DIAGNOSIS — I252 Old myocardial infarction: Secondary | ICD-10-CM | POA: Diagnosis not present

## 2015-02-16 DIAGNOSIS — Y9289 Other specified places as the place of occurrence of the external cause: Secondary | ICD-10-CM | POA: Diagnosis not present

## 2015-02-16 DIAGNOSIS — S31823A Puncture wound without foreign body of left buttock, initial encounter: Secondary | ICD-10-CM | POA: Insufficient documentation

## 2015-02-16 DIAGNOSIS — Y9389 Activity, other specified: Secondary | ICD-10-CM | POA: Diagnosis not present

## 2015-02-16 DIAGNOSIS — S71102A Unspecified open wound, left thigh, initial encounter: Secondary | ICD-10-CM | POA: Diagnosis not present

## 2015-02-16 DIAGNOSIS — E039 Hypothyroidism, unspecified: Secondary | ICD-10-CM | POA: Insufficient documentation

## 2015-02-16 DIAGNOSIS — Z7982 Long term (current) use of aspirin: Secondary | ICD-10-CM | POA: Diagnosis not present

## 2015-02-16 DIAGNOSIS — Y998 Other external cause status: Secondary | ICD-10-CM | POA: Diagnosis not present

## 2015-02-16 DIAGNOSIS — I509 Heart failure, unspecified: Secondary | ICD-10-CM | POA: Diagnosis not present

## 2015-02-16 DIAGNOSIS — Z0389 Encounter for observation for other suspected diseases and conditions ruled out: Secondary | ICD-10-CM | POA: Diagnosis not present

## 2015-02-16 DIAGNOSIS — W3400XA Accidental discharge from unspecified firearms or gun, initial encounter: Secondary | ICD-10-CM

## 2015-02-16 DIAGNOSIS — Z880 Allergy status to penicillin: Secondary | ICD-10-CM | POA: Insufficient documentation

## 2015-02-16 DIAGNOSIS — W3302XA Accidental discharge of hunting rifle, initial encounter: Secondary | ICD-10-CM | POA: Insufficient documentation

## 2015-02-16 DIAGNOSIS — G8929 Other chronic pain: Secondary | ICD-10-CM | POA: Insufficient documentation

## 2015-02-16 DIAGNOSIS — S3993XA Unspecified injury of pelvis, initial encounter: Secondary | ICD-10-CM | POA: Diagnosis not present

## 2015-02-16 NOTE — ED Notes (Signed)
Pt stable, ambulatory, states understanding of discharge instructions 
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
## 2015-02-19 DIAGNOSIS — D72829 Elevated white blood cell count, unspecified: Secondary | ICD-10-CM | POA: Diagnosis not present

## 2015-02-27 DIAGNOSIS — G894 Chronic pain syndrome: Secondary | ICD-10-CM | POA: Diagnosis not present

## 2015-02-27 DIAGNOSIS — I10 Essential (primary) hypertension: Secondary | ICD-10-CM | POA: Diagnosis not present

## 2015-02-27 DIAGNOSIS — J209 Acute bronchitis, unspecified: Secondary | ICD-10-CM | POA: Diagnosis not present

## 2015-02-27 DIAGNOSIS — Z1389 Encounter for screening for other disorder: Secondary | ICD-10-CM | POA: Diagnosis not present

## 2015-02-27 DIAGNOSIS — J0100 Acute maxillary sinusitis, unspecified: Secondary | ICD-10-CM | POA: Diagnosis not present

## 2015-02-27 DIAGNOSIS — Z6830 Body mass index (BMI) 30.0-30.9, adult: Secondary | ICD-10-CM | POA: Diagnosis not present

## 2015-03-31 DIAGNOSIS — J45909 Unspecified asthma, uncomplicated: Secondary | ICD-10-CM | POA: Diagnosis not present

## 2015-03-31 DIAGNOSIS — E6609 Other obesity due to excess calories: Secondary | ICD-10-CM | POA: Diagnosis not present

## 2015-03-31 DIAGNOSIS — Z6830 Body mass index (BMI) 30.0-30.9, adult: Secondary | ICD-10-CM | POA: Diagnosis not present

## 2015-03-31 DIAGNOSIS — I10 Essential (primary) hypertension: Secondary | ICD-10-CM | POA: Diagnosis not present

## 2015-03-31 DIAGNOSIS — G894 Chronic pain syndrome: Secondary | ICD-10-CM | POA: Diagnosis not present

## 2015-03-31 DIAGNOSIS — Z1389 Encounter for screening for other disorder: Secondary | ICD-10-CM | POA: Diagnosis not present

## 2015-04-15 IMAGING — CR DG ABDOMEN 2V
2 series · 2 of 2 positions shown · non-contrast
Comparison: CT 05/11/2014

CLINICAL DATA: Nausea, vomiting and diarrhea for 2 days. Recent
colonoscopy. Abdominal pain.

EXAM:
ABDOMEN - 2 VIEW

[abdomen erect]
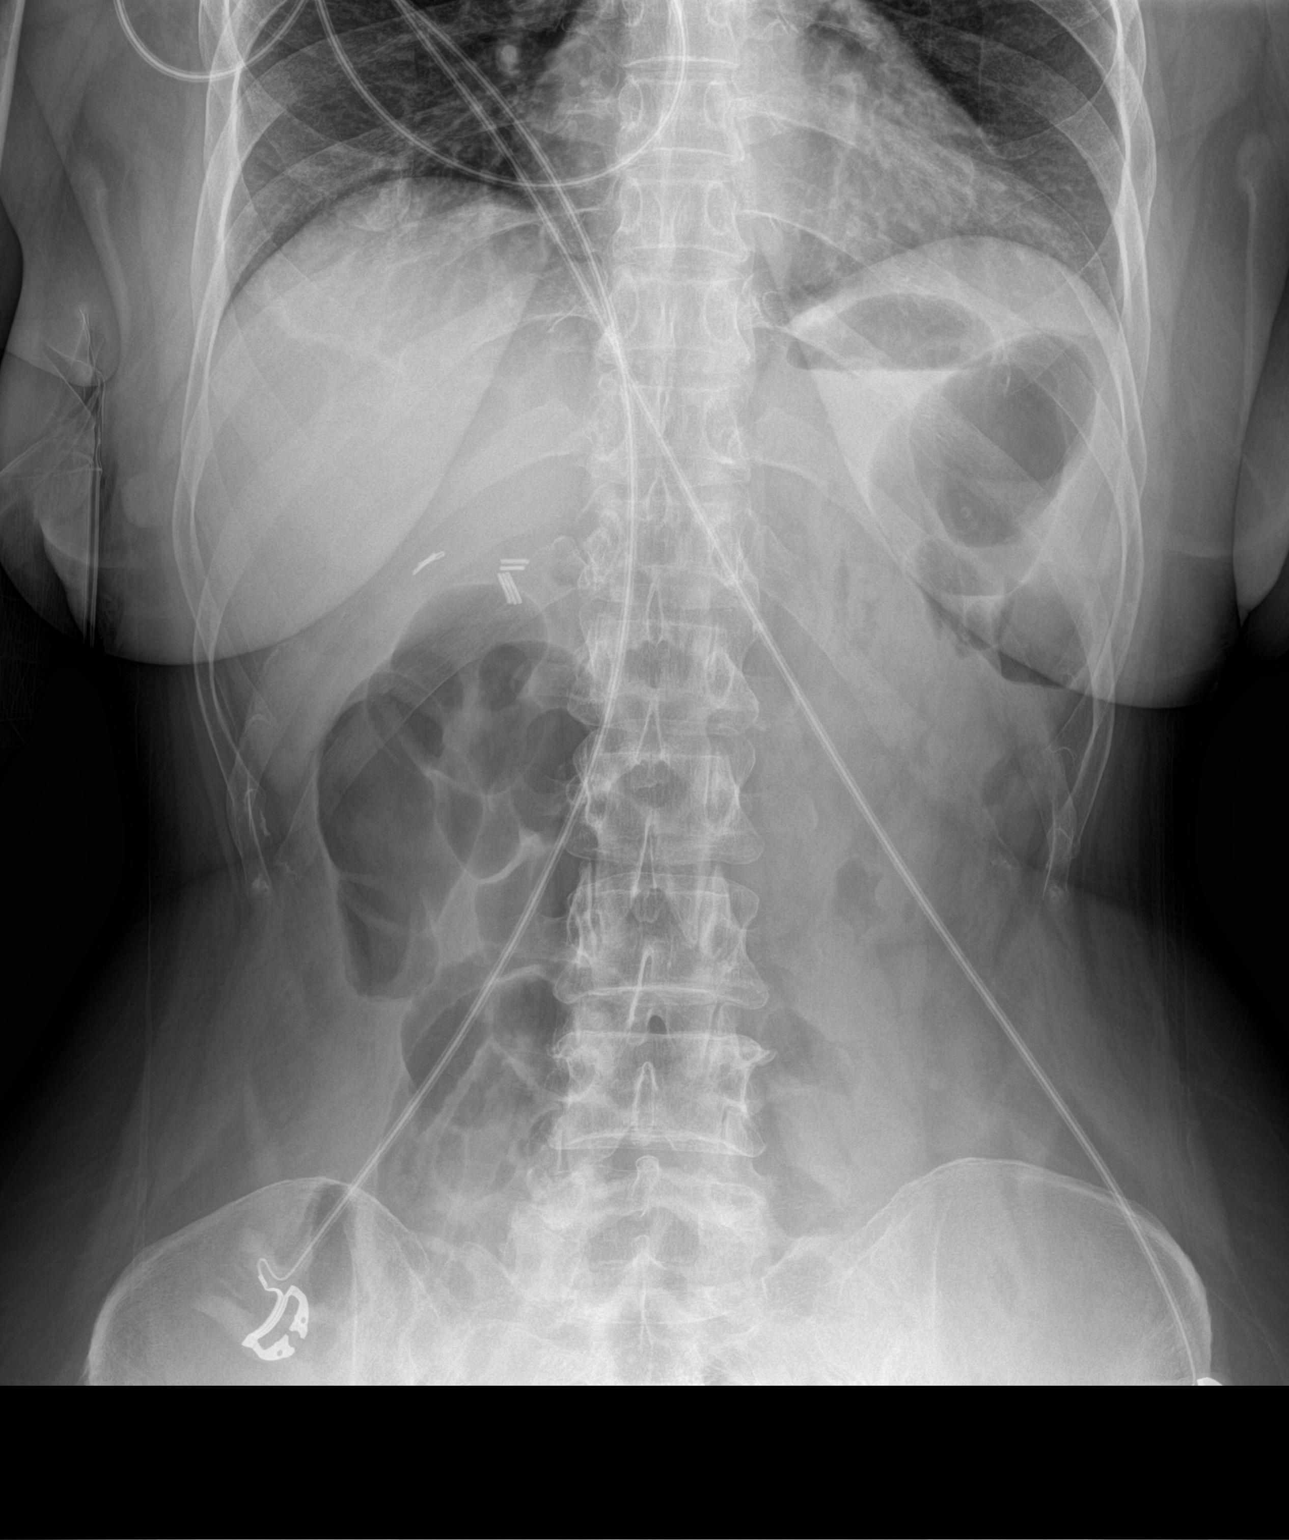

[abdomen supine]
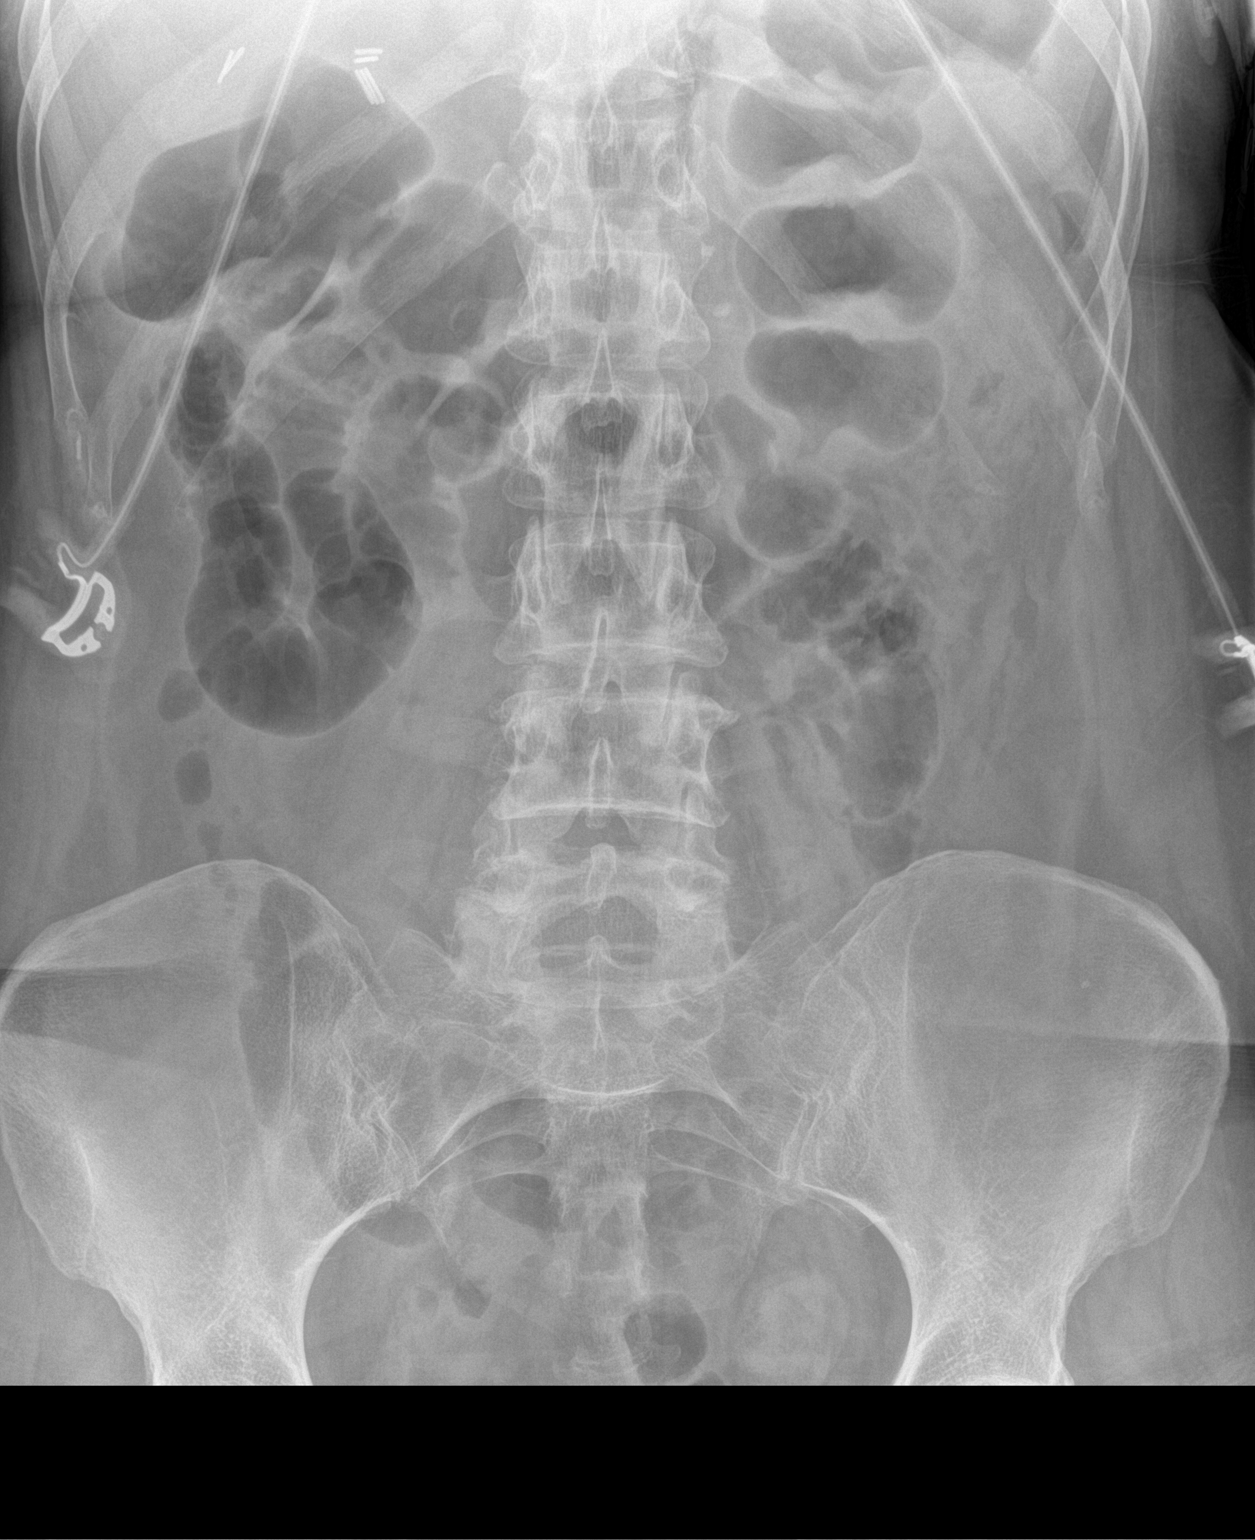

[2 of 2 positions shown; findings below may reference images not displayed]

FINDINGS: Negative for free air. Gas-filled loops of small and large bowel.
Nonobstructive bowel gas pattern. Cholecystectomy clips in the right
abdomen. Phleboliths in the pelvis.
IMPRESSION: No acute findings.

## 2015-04-29 ENCOUNTER — Other Ambulatory Visit (HOSPITAL_COMMUNITY): Payer: Self-pay | Admitting: Internal Medicine

## 2015-04-29 DIAGNOSIS — E063 Autoimmune thyroiditis: Secondary | ICD-10-CM | POA: Diagnosis not present

## 2015-04-29 DIAGNOSIS — F419 Anxiety disorder, unspecified: Secondary | ICD-10-CM | POA: Diagnosis not present

## 2015-04-29 DIAGNOSIS — G894 Chronic pain syndrome: Secondary | ICD-10-CM | POA: Diagnosis not present

## 2015-04-29 DIAGNOSIS — Z1389 Encounter for screening for other disorder: Secondary | ICD-10-CM | POA: Diagnosis not present

## 2015-04-29 DIAGNOSIS — E782 Mixed hyperlipidemia: Secondary | ICD-10-CM | POA: Diagnosis not present

## 2015-04-29 DIAGNOSIS — I10 Essential (primary) hypertension: Secondary | ICD-10-CM | POA: Diagnosis not present

## 2015-04-29 DIAGNOSIS — Z6830 Body mass index (BMI) 30.0-30.9, adult: Secondary | ICD-10-CM | POA: Diagnosis not present

## 2015-04-29 DIAGNOSIS — E6609 Other obesity due to excess calories: Secondary | ICD-10-CM | POA: Diagnosis not present

## 2015-04-29 DIAGNOSIS — Z1231 Encounter for screening mammogram for malignant neoplasm of breast: Secondary | ICD-10-CM

## 2015-04-29 DIAGNOSIS — M1991 Primary osteoarthritis, unspecified site: Secondary | ICD-10-CM | POA: Diagnosis not present

## 2015-05-02 ENCOUNTER — Ambulatory Visit (HOSPITAL_COMMUNITY): Payer: Medicare Other

## 2015-05-04 ENCOUNTER — Emergency Department (HOSPITAL_COMMUNITY): Payer: Medicare Other

## 2015-05-04 ENCOUNTER — Emergency Department (HOSPITAL_COMMUNITY)
Admission: EM | Admit: 2015-05-04 | Discharge: 2015-05-04 | Disposition: A | Discharge status: Home / Self Care | Payer: Medicare Other | Attending: Emergency Medicine | Admitting: Emergency Medicine

## 2015-05-04 ENCOUNTER — Encounter (HOSPITAL_COMMUNITY): Payer: Self-pay

## 2015-05-04 DIAGNOSIS — Z79899 Other long term (current) drug therapy: Secondary | ICD-10-CM | POA: Diagnosis not present

## 2015-05-04 DIAGNOSIS — I10 Essential (primary) hypertension: Secondary | ICD-10-CM | POA: Diagnosis not present

## 2015-05-04 DIAGNOSIS — W2107XA Struck by softball, initial encounter: Secondary | ICD-10-CM | POA: Diagnosis not present

## 2015-05-04 DIAGNOSIS — S0993XA Unspecified injury of face, initial encounter: Secondary | ICD-10-CM | POA: Diagnosis present

## 2015-05-04 DIAGNOSIS — Y9289 Other specified places as the place of occurrence of the external cause: Secondary | ICD-10-CM | POA: Diagnosis not present

## 2015-05-04 DIAGNOSIS — E785 Hyperlipidemia, unspecified: Secondary | ICD-10-CM | POA: Insufficient documentation

## 2015-05-04 DIAGNOSIS — Y9389 Activity, other specified: Secondary | ICD-10-CM | POA: Insufficient documentation

## 2015-05-04 DIAGNOSIS — G8929 Other chronic pain: Secondary | ICD-10-CM | POA: Insufficient documentation

## 2015-05-04 DIAGNOSIS — S02401A Maxillary fracture, unspecified, initial encounter for closed fracture: Secondary | ICD-10-CM | POA: Diagnosis not present

## 2015-05-04 DIAGNOSIS — S0011XA Contusion of right eyelid and periocular area, initial encounter: Secondary | ICD-10-CM | POA: Insufficient documentation

## 2015-05-04 DIAGNOSIS — I2510 Atherosclerotic heart disease of native coronary artery without angina pectoris: Secondary | ICD-10-CM | POA: Insufficient documentation

## 2015-05-04 DIAGNOSIS — Z880 Allergy status to penicillin: Secondary | ICD-10-CM | POA: Diagnosis not present

## 2015-05-04 DIAGNOSIS — I509 Heart failure, unspecified: Secondary | ICD-10-CM | POA: Diagnosis not present

## 2015-05-04 DIAGNOSIS — Z7982 Long term (current) use of aspirin: Secondary | ICD-10-CM | POA: Diagnosis not present

## 2015-05-04 DIAGNOSIS — E039 Hypothyroidism, unspecified: Secondary | ICD-10-CM | POA: Diagnosis not present

## 2015-05-04 DIAGNOSIS — Y998 Other external cause status: Secondary | ICD-10-CM | POA: Diagnosis not present

## 2015-05-04 DIAGNOSIS — Z720 Tobacco use: Secondary | ICD-10-CM | POA: Insufficient documentation

## 2015-05-04 DIAGNOSIS — F419 Anxiety disorder, unspecified: Secondary | ICD-10-CM | POA: Diagnosis not present

## 2015-05-04 MED ORDER — OXYCODONE-ACETAMINOPHEN 5-325 MG PO TABS
1.0000 | ORAL_TABLET | Freq: Once | ORAL | Status: AC
  Administered 2015-05-04: 1 via ORAL
  Filled 2015-05-04: qty 1

## 2015-05-04 MED ORDER — OXYCODONE-ACETAMINOPHEN 5-325 MG PO TABS: 1.0000 | ORAL_TABLET | Freq: Four times a day (QID) | ORAL | Status: DC | PRN

## 2015-05-04 MED ORDER — LEVOFLOXACIN 750 MG PO TABS: 750.0000 mg | ORAL_TABLET | Freq: Every day | ORAL | Status: DC

## 2015-05-04 NOTE — ED Provider Notes (Signed)
CSN: [id number]     Arrival date & time 05/04/15  1457 History   First MD Initiated Contact with Patient 05/04/15 1705     Chief Complaint  Patient presents with  . Facial Injury     (Consider location/radiation/quality/duration/timing/severity/associated sxs/prior Treatment) HPI  [name] is a 49 yo female who presents with right sided facial pain. She was playing baseball with her son on Thursday, 05/01/15 and the ball hit her in the right maxillary/ eye region. She woke up this morning with increased bruising and swelling. She has been icing it for 20 minutes off and on. This has helped with the swelling and the pain. She takes oxycodone for back pain, this has helped her facial pain. She denies N/V, heahache, neck pain, nosebleed, weakness, dizziness, back pain, shortness of breath, syncope, or blurry vision.   Past Medical History  Diagnosis Date  . Cardiomyopathy     resolved  . Chest pain     chronicc  . Hyperlipidemia   . HTN (hypertension)   . Hypothyroidism   . Adrenal insufficiency     diagnosed 2012  . Anxiety   . Nondiabetic gastroparesis   . CAD (coronary artery disease)     Cath 2008 EF normal. RCA 50-60, Septal 50%. Myoview 3/12: EF 53% normal perfusion  . Chronic back pain   . QT prolongation   . Mitral valve prolapse   . Gastroparesis   . Chronic diarrhea   . Astigmatism   . Concussion     sept 28th 2014  . CHF (congestive heart failure)   . Cardiac arrest     2/2 adissonian crisis  . Tobacco abuse     down to 2 cigarettes per day   Past Surgical History  Procedure Laterality Date  . Cholecystectomy    . Spine surgery    . Vesicovaginal fistula closure w/ tah    . Varicose vein surgery    . Abdominal hysterectomy    . Cardiac catheterization  03/2007    showed 60% lesion in the right coronary artery  . Left heart catheterization with coronary angiogram N/A 04/13/2012    Procedure: LEFT HEART CATHETERIZATION WITH CORONARY ANGIOGRAM;  Surgeon:  Hillary Bow, MD;  Location: [facility] CATH LAB;  Service: Cardiovascular;  Laterality: N/A;   Family History  Problem Relation Age of Onset  . Coronary artery disease    . Heart attack Mother   . Cancer Father     Colon   Social History  Substance Use Topics  . Smoking status: Current Every Day Smoker -- 0.50 packs/day for 10 years    Types: Cigarettes  . Smokeless tobacco: Never Used     Comment: smokes 6-7 cigarettes per day  . Alcohol Use: No   OB History    Gravida Para Term Preterm AB TAB SAB Ectopic Multiple Living   3 2 2  0 1  1   2      Review of Systems  All other systems negative except as documented in the HPI. All pertinent positives and negatives as reviewed in the HPI.  Allergies  Bee venom; Penicillins; Cephalexin; Doxycycline; Dust mite extract; Erythromycin; Ibuprofen; Morphine and related; Sulfonamide derivatives; and Tramadol  Home Medications   Prior to Admission medications   Medication Sig Start Date End Date Taking? Authorizing Provider  aspirin 81 MG chewable tablet Chew 81 mg by mouth daily. Daily dose is 1 tab, takes 4 tabs if feeling chest pain or pressure    Historical  Provider, MD  cholecalciferol (VITAMIN D) 1000 UNITS tablet Take 1,000 Units by mouth daily.    Historical Provider, MD  cyclobenzaprine (FLEXERIL) 10 MG tablet Take 10 mg by mouth 3 (three) times daily as needed for muscle spasms.     Historical Provider, MD  hydrochlorothiazide (HYDRODIURIL) 25 MG tablet Take 12.5 mg by mouth 2 (two) times daily as needed (Blood pressure rises).     Historical Provider, MD  hydrocortisone (CORTEF) 10 MG tablet Take 10-20 mg by mouth 2 (two) times daily. Pt takes 20 mg in the morning and 10 mg at night    Historical Provider, MD  levothyroxine (SYNTHROID, LEVOTHROID) 75 MCG tablet Take 75 mcg by mouth daily before breakfast.    Historical Provider, MD  LORazepam (ATIVAN) 2 MG tablet Take 2 mg by mouth every 6 (six) hours as needed for anxiety.     Historical Provider, MD  meclizine (ANTIVERT) 25 MG tablet Take 1 tablet (25 mg total) by mouth 4 (four) times daily as needed for dizziness. 06/15/13   Rolland Porter, MD  metoCLOPramide (REGLAN) 10 MG tablet Take 20 mg by mouth 3 (three) times daily. Runny stools    Historical Provider, MD  nitroGLYCERIN (NITROSTAT) 0.4 MG SL tablet Place 0.4 mg under the tongue every 5 (five) minutes as needed for chest pain.    Historical Provider, MD  omega-3 acid ethyl esters (LOVAZA) 1 G capsule Take 1 g by mouth 3 (three) times daily.    Historical Provider, MD  ondansetron (ZOFRAN) 4 MG tablet Take 4-8 mg by mouth every 4 (four) hours as needed for nausea.     Historical Provider, MD  oxycodone (ROXICODONE) 30 MG immediate release tablet Take 30 mg by mouth every 6 (six) hours as needed for pain.     Historical Provider, MD  pramipexole (MIRAPEX) 0.125 MG tablet Take 0.125 mg by mouth at bedtime as needed (restles leg syndrome).    Historical Provider, MD  PROAIR HFA 108 (90 BASE) MCG/ACT inhaler Inhale 2 puffs into the lungs every 6 (six) hours as needed. 07/08/14   Historical Provider, MD  promethazine (PHENERGAN) 25 MG suppository Place 1 suppository (25 mg total) rectally every 6 (six) hours as needed for nausea or vomiting. 08/04/14   Courtney Forcucci, PA-C  zolpidem (AMBIEN) 10 MG tablet Take 10 mg by mouth at bedtime.  03/20/14   Historical Provider, MD   BP 115/81 mmHg  Pulse 90  Temp(Src) 98.7 F (37.1 C) (Oral)  Resp 16  Ht 5\' 5"  (1.651 m)  Wt 194 lb 3.2 oz (88.089 kg)  BMI 32.32 kg/m2  SpO2 94% Physical Exam  Constitutional: She is oriented to person, place, and time. She appears well-developed and well-nourished. No distress.  HENT:  Head: Normocephalic.  Mouth/Throat: Oropharynx is clear and moist.  Swelling and bruising around the right eye. Right maxillary swelling.   Eyes: Conjunctivae are normal. Pupils are equal, round, and reactive to light. Right eye exhibits no discharge.  Neck:  Normal range of motion. Neck supple.  Cardiovascular: Normal rate, regular rhythm and normal heart sounds.  Exam reveals no gallop and no friction rub.   No murmur heard. Pulmonary/Chest: Effort normal and breath sounds normal. No respiratory distress.  Neurological: She is alert and oriented to person, place, and time. She exhibits normal muscle tone. Coordination normal.  Skin: Skin is warm and dry. No erythema.  Psychiatric: She has a normal mood and affect. Her behavior is normal.  Nursing note and vitals  reviewed.   ED Course  Procedures (including critical care time) Labs Review Labs Reviewed - No data to display  Imaging Review Ct Maxillofacial Wo Cm  05/04/2015   CLINICAL DATA:  Struck in the right eye with baseball today. Initial encounter.  EXAM: CT MAXILLOFACIAL WITHOUT CONTRAST  TECHNIQUE: Multidetector CT imaging of the maxillofacial structures was performed. Multiplanar CT image reconstructions were also generated. A small metallic BB was placed on the right temple in order to reliably differentiate right from left.  COMPARISON:  CT 08/24/2013.  FINDINGS: There is a comminuted fracture of the anterior wall of the right maxillary sinus. This fracture demonstrates up to 6 mm of depression. No orbital floor or other orbital fracture demonstrated. There is no other evidence of acute facial fracture. The mandible is intact.  Chronic asymmetric right TMJ degenerative changes are noted. The mastoid air cells and middle ears are clear. There is blood within the right maxillary sinus. The additional paranasal sinuses are clear. Previous cervical fusion noted.  There is mild periorbital soft tissue swelling on the right. No postseptal hematoma identified. The globes are intact. The optic nerves and extraocular muscles appear normal.  IMPRESSION: 1. Depressed and mildly comminuted fracture of the anterior wall of the right maxillary sinus. 2. No evidence of orbital or other facial fracture. 3.  Periorbital soft tissue swelling on the right. No evidence of globe injury.   Electronically Signed   By: Richardean Sale M.D.   On: 05/04/2015 18:43   I have personally reviewed and evaluated these images and lab results as part of my medical decision-making.  Patient will be referred to ear, nose and throat have advised her not to blow her nose and also to elevate her head while sleeping.  Patient is advised to return here as needed.  She also be placed on antibiotics patient is advised plan and all questions were answered    Dalia Heading, PA-C 05/04/15 [place], MD 05/04/15 2352

## 2015-05-04 NOTE — ED Notes (Signed)
Patient transported to CT 

## 2015-05-04 NOTE — ED Notes (Signed)
Onset 05-01-15 pt out playing ball with son.  Ball went up in air and came down through trees, as pt was trying to cath, tree limb hit her right inner eyebrow and softball hit her right eye.  Pain, Bruising and swelling to upper eyelid and underneath right eye. No blurred vision.

## 2015-05-04 NOTE — Discharge Instructions (Signed)
Return here as needed.  Follow-up with the ENT, Dr. provided do not believe her nose.  Sleep with your head elevated, use ice on the area that is swollen

## 2015-05-08 DIAGNOSIS — R630 Anorexia: Secondary | ICD-10-CM | POA: Diagnosis not present

## 2015-05-08 DIAGNOSIS — E039 Hypothyroidism, unspecified: Secondary | ICD-10-CM | POA: Diagnosis not present

## 2015-05-08 DIAGNOSIS — E273 Drug-induced adrenocortical insufficiency: Secondary | ICD-10-CM | POA: Diagnosis not present

## 2015-05-08 DIAGNOSIS — W2103XD Struck by baseball, subsequent encounter: Secondary | ICD-10-CM | POA: Diagnosis not present

## 2015-05-08 DIAGNOSIS — S02400D Malar fracture unspecified, subsequent encounter for fracture with routine healing: Secondary | ICD-10-CM | POA: Diagnosis not present

## 2015-05-08 DIAGNOSIS — I10 Essential (primary) hypertension: Secondary | ICD-10-CM | POA: Diagnosis not present

## 2015-05-08 DIAGNOSIS — S02401D Maxillary fracture, unspecified, subsequent encounter for fracture with routine healing: Secondary | ICD-10-CM | POA: Diagnosis not present

## 2015-05-23 DIAGNOSIS — E271 Primary adrenocortical insufficiency: Secondary | ICD-10-CM | POA: Diagnosis not present

## 2015-05-23 DIAGNOSIS — F419 Anxiety disorder, unspecified: Secondary | ICD-10-CM | POA: Diagnosis not present

## 2015-05-23 DIAGNOSIS — Z1389 Encounter for screening for other disorder: Secondary | ICD-10-CM | POA: Diagnosis not present

## 2015-05-23 DIAGNOSIS — G894 Chronic pain syndrome: Secondary | ICD-10-CM | POA: Diagnosis not present

## 2015-05-23 DIAGNOSIS — G20 Parkinson's disease: Secondary | ICD-10-CM | POA: Diagnosis not present

## 2015-05-25 ENCOUNTER — Encounter (HOSPITAL_COMMUNITY): Payer: Self-pay | Admitting: Emergency Medicine

## 2015-05-25 ENCOUNTER — Observation Stay (HOSPITAL_COMMUNITY)
Admission: EM | Admit: 2015-05-25 | Discharge: 2015-05-26 | Disposition: A | Discharge status: Home / Self Care | Payer: Medicare Other | Attending: Internal Medicine | Admitting: Internal Medicine

## 2015-05-25 ENCOUNTER — Emergency Department (HOSPITAL_COMMUNITY): Payer: Medicare Other

## 2015-05-25 DIAGNOSIS — G8929 Other chronic pain: Secondary | ICD-10-CM | POA: Insufficient documentation

## 2015-05-25 DIAGNOSIS — R079 Chest pain, unspecified: Secondary | ICD-10-CM | POA: Diagnosis not present

## 2015-05-25 DIAGNOSIS — E785 Hyperlipidemia, unspecified: Secondary | ICD-10-CM | POA: Diagnosis not present

## 2015-05-25 DIAGNOSIS — M549 Dorsalgia, unspecified: Secondary | ICD-10-CM | POA: Diagnosis not present

## 2015-05-25 DIAGNOSIS — Z880 Allergy status to penicillin: Secondary | ICD-10-CM | POA: Insufficient documentation

## 2015-05-25 DIAGNOSIS — Z8249 Family history of ischemic heart disease and other diseases of the circulatory system: Secondary | ICD-10-CM | POA: Diagnosis not present

## 2015-05-25 DIAGNOSIS — E039 Hypothyroidism, unspecified: Secondary | ICD-10-CM | POA: Diagnosis not present

## 2015-05-25 DIAGNOSIS — I10 Essential (primary) hypertension: Secondary | ICD-10-CM | POA: Diagnosis not present

## 2015-05-25 DIAGNOSIS — F419 Anxiety disorder, unspecified: Secondary | ICD-10-CM | POA: Diagnosis not present

## 2015-05-25 DIAGNOSIS — E271 Primary adrenocortical insufficiency: Secondary | ICD-10-CM | POA: Diagnosis not present

## 2015-05-25 DIAGNOSIS — E2740 Unspecified adrenocortical insufficiency: Secondary | ICD-10-CM | POA: Diagnosis not present

## 2015-05-25 DIAGNOSIS — I2510 Atherosclerotic heart disease of native coronary artery without angina pectoris: Secondary | ICD-10-CM | POA: Diagnosis not present

## 2015-05-25 DIAGNOSIS — Z7982 Long term (current) use of aspirin: Secondary | ICD-10-CM | POA: Diagnosis not present

## 2015-05-25 DIAGNOSIS — R002 Palpitations: Secondary | ICD-10-CM | POA: Diagnosis present

## 2015-05-25 DIAGNOSIS — E272 Addisonian crisis: Secondary | ICD-10-CM | POA: Diagnosis not present

## 2015-05-25 DIAGNOSIS — R0789 Other chest pain: Secondary | ICD-10-CM | POA: Diagnosis not present

## 2015-05-25 DIAGNOSIS — M542 Cervicalgia: Secondary | ICD-10-CM | POA: Diagnosis not present

## 2015-05-25 DIAGNOSIS — Z720 Tobacco use: Secondary | ICD-10-CM

## 2015-05-25 DIAGNOSIS — F17210 Nicotine dependence, cigarettes, uncomplicated: Secondary | ICD-10-CM | POA: Diagnosis not present

## 2015-05-25 DIAGNOSIS — K219 Gastro-esophageal reflux disease without esophagitis: Secondary | ICD-10-CM | POA: Diagnosis not present

## 2015-05-25 DIAGNOSIS — R251 Tremor, unspecified: Secondary | ICD-10-CM | POA: Diagnosis present

## 2015-05-25 LAB — CBC
HCT: 44.3 % (ref 36.0–46.0)
Hemoglobin: 15.4 g/dL — ABNORMAL HIGH (ref 12.0–15.0)
MCH: 29.2 pg (ref 26.0–34.0)
MCHC: 34.8 g/dL (ref 30.0–36.0)
MCV: 83.9 fL (ref 78.0–100.0)
PLATELETS: 451 10*3/uL — AB (ref 150–400)
RBC: 5.28 MIL/uL — ABNORMAL HIGH (ref 3.87–5.11)
RDW: 14.1 % (ref 11.5–15.5)
WBC: 11.3 10*3/uL — ABNORMAL HIGH (ref 4.0–10.5)

## 2015-05-25 LAB — BASIC METABOLIC PANEL
Anion gap: 12 (ref 5–15)
BUN: 9 mg/dL (ref 6–20)
CHLORIDE: 100 mmol/L — AB (ref 101–111)
CO2: 26 mmol/L (ref 22–32)
CREATININE: 0.83 mg/dL (ref 0.44–1.00)
Calcium: 8.9 mg/dL (ref 8.9–10.3)
GFR calc Af Amer: >60 mL/min (ref >60)
GFR calc non Af Amer: >60 mL/min (ref >60)
GLUCOSE: 105 mg/dL — AB (ref 65–99)
Potassium: 3.2 mmol/L — ABNORMAL LOW (ref 3.5–5.1)
Sodium: 138 mmol/L (ref 135–145)

## 2015-05-25 LAB — I-STAT TROPONIN, ED: Troponin i, poc: 0 ng/mL (ref 0.00–0.08)

## 2015-05-25 MED ORDER — MORPHINE SULFATE (PF) 4 MG/ML IV SOLN
4.0000 mg | Freq: Once | INTRAVENOUS | Status: DC
Start: 2015-05-25 — End: 2015-05-25
  Filled 2015-05-25: qty 1

## 2015-05-25 MED ORDER — ACETAMINOPHEN 500 MG PO TABS
1000.0000 mg | ORAL_TABLET | Freq: Once | ORAL | Status: AC
  Administered 2015-05-25: 1000 mg via ORAL
  Filled 2015-05-25: qty 2

## 2015-05-25 MED ORDER — ASPIRIN 325 MG PO TABS
325.0000 mg | ORAL_TABLET | Freq: Once | ORAL | Status: AC
  Administered 2015-05-25: 325 mg via ORAL
  Filled 2015-05-25: qty 1

## 2015-05-25 MED ORDER — ACETAMINOPHEN 325 MG PO TABS: 325.0000 mg | ORAL_TABLET | Freq: Once | ORAL | Status: DC

## 2015-05-25 NOTE — ED Provider Notes (Signed)
CSN: [id number]     Arrival date & time 05/25/15  2048 History   First MD Initiated Contact with Patient 05/25/15 2116     Chief Complaint  Patient presents with  . Chest Pain     (Consider location/radiation/quality/duration/timing/severity/associated sxs/prior Treatment) HPI Patient reports she is expressing chest pain that started yesterday evening. She reports as a heaviness and it radiates towards her left arm and neck. It has been coming and going. She reports as been worse when she is up and doing things. She has had some sort associated shortness of breath. No leg swelling or calf pain. She does have a prior history of congestive heart failure. She denies history of prior MI.  Family history is positive for her mother dying at age 40 due to heart attack. The patient is a cigarette smoker. Past Medical History  Diagnosis Date  . Cardiomyopathy     resolved  . Chest pain     chronicc  . Hyperlipidemia   . HTN (hypertension)   . Hypothyroidism   . Adrenal insufficiency     diagnosed 2012  . Anxiety   . Nondiabetic gastroparesis   . CAD (coronary artery disease)     Cath 2008 EF normal. RCA 50-60, Septal 50%. Myoview 3/12: EF 53% normal perfusion  . Chronic back pain   . QT prolongation   . Mitral valve prolapse   . Gastroparesis   . Chronic diarrhea   . Astigmatism   . Concussion     sept 28th 2014  . CHF (congestive heart failure)   . Cardiac arrest     2/2 adissonian crisis  . Tobacco abuse     down to 2 cigarettes per day   Past Surgical History  Procedure Laterality Date  . Cholecystectomy    . Spine surgery    . Vesicovaginal fistula closure w/ tah    . Varicose vein surgery    . Abdominal hysterectomy    . Cardiac catheterization  03/2007    showed 60% lesion in the right coronary artery  . Left heart catheterization with coronary angiogram N/A 04/13/2012    Procedure: LEFT HEART CATHETERIZATION WITH CORONARY ANGIOGRAM;  Surgeon: Hillary Bow, MD;   Location: [facility] CATH LAB;  Service: Cardiovascular;  Laterality: N/A;   Family History  Problem Relation Age of Onset  . Coronary artery disease    . Heart attack Mother   . Cancer Father     Colon   Social History  Substance Use Topics  . Smoking status: Current Every Day Smoker -- 0.50 packs/day for 10 years    Types: Cigarettes  . Smokeless tobacco: Never Used     Comment: smokes 6-7 cigarettes per day  . Alcohol Use: No   OB History    Gravida Para Term Preterm AB TAB SAB Ectopic Multiple Living   3 2 2  0 1  1   2      Review of Systems  10 Systems reviewed and are negative for acute change except as noted in the HPI.   Allergies  Bee venom; Doxycycline; Erythromycin; Ibuprofen; Penicillins; Cephalexin; Dust mite extract; Morphine and related; Sulfonamide derivatives; and Tramadol  Home Medications   Prior to Admission medications   Medication Sig Start Date End Date Taking? Authorizing Provider  aspirin 81 MG chewable tablet Chew 81 mg by mouth daily. Daily dose is 1 tab, takes 4 tabs if feeling chest pain or pressure   Yes Historical Provider, MD  cholecalciferol (VITAMIN D)  1000 UNITS tablet Take 1,000 Units by mouth daily.   Yes Historical Provider, MD  cyclobenzaprine (FLEXERIL) 10 MG tablet Take 10 mg by mouth 3 (three) times daily as needed for muscle spasms.    Yes Historical Provider, MD  hydrochlorothiazide (HYDRODIURIL) 25 MG tablet Take 12.5 mg by mouth 2 (two) times daily as needed (Blood pressure rises).    Yes Historical Provider, MD  hydrocortisone (CORTEF) 10 MG tablet Take 10-20 mg by mouth 2 (two) times daily. Pt takes 20 mg in the morning and 10 mg at night   Yes Historical Provider, MD  levofloxacin (LEVAQUIN) 750 MG tablet Take 1 tablet (750 mg total) by mouth daily. 05/04/15  Yes Christopher Lawyer, PA-C  levothyroxine (SYNTHROID, LEVOTHROID) 75 MCG tablet Take 75 mcg by mouth daily before breakfast.   Yes Historical Provider, MD  LORazepam (ATIVAN) 2 MG  tablet Take 2 mg by mouth every 6 (six) hours as needed for anxiety.   Yes Historical Provider, MD  meclizine (ANTIVERT) 25 MG tablet Take 1 tablet (25 mg total) by mouth 4 (four) times daily as needed for dizziness. 06/15/13  Yes Rolland Porter, MD  metoCLOPramide (REGLAN) 10 MG tablet Take 20 mg by mouth 3 (three) times daily. Runny stools   Yes Historical Provider, MD  nitroGLYCERIN (NITROSTAT) 0.4 MG SL tablet Place 0.4 mg under the tongue every 5 (five) minutes as needed for chest pain.   Yes Historical Provider, MD  ondansetron (ZOFRAN) 4 MG tablet Take 4-8 mg by mouth every 4 (four) hours as needed for nausea.    Yes Historical Provider, MD  oxycodone (ROXICODONE) 30 MG immediate release tablet Take 30 mg by mouth every 6 (six) hours as needed for pain.    Yes Historical Provider, MD  pramipexole (MIRAPEX) 0.125 MG tablet Take 0.125 mg by mouth at bedtime as needed (restles leg syndrome).   Yes Historical Provider, MD  PROAIR HFA 108 (90 BASE) MCG/ACT inhaler Inhale 2 puffs into the lungs every 6 (six) hours as needed. 07/08/14  Yes Historical Provider, MD  zolpidem (AMBIEN) 10 MG tablet Take 10 mg by mouth at bedtime.  03/20/14  Yes Historical Provider, MD  oxyCODONE-acetaminophen (PERCOCET/ROXICET) 5-325 MG per tablet Take 1 tablet by mouth every 6 (six) hours as needed for severe pain. Patient not taking: Reported on 05/25/2015 05/04/15   Dalia Heading, PA-C   BP 160/86 mmHg  Pulse 72  Temp(Src) 98.2 F (36.8 C) (Oral)  Resp 12  Ht 5\' 5"  (1.651 m)  Wt 190 lb (86.183 kg)  BMI 31.62 kg/m2  SpO2 100% Physical Exam  Constitutional: She is oriented to person, place, and time. She appears well-developed and well-nourished.  HENT:  Head: Normocephalic and atraumatic.  Eyes: EOM are normal. Pupils are equal, round, and reactive to light.  Neck: Neck supple.  Cardiovascular: Normal rate, regular rhythm, normal heart sounds and intact distal pulses.   Pulmonary/Chest: Effort normal and breath  sounds normal.  Abdominal: Soft. Bowel sounds are normal. She exhibits no distension. There is no tenderness.  Musculoskeletal: Normal range of motion. She exhibits no edema.  Neurological: She is alert and oriented to person, place, and time. She has normal strength. Coordination normal. GCS eye subscore is 4. GCS verbal subscore is 5. GCS motor subscore is 6.  Skin: Skin is warm, dry and intact.  Psychiatric: She has a normal mood and affect.    ED Course  Procedures (including critical care time) Labs Review Labs Reviewed  BASIC METABOLIC  PANEL - Abnormal; Notable for the following:    Potassium 3.2 (*)    Chloride 100 (*)    Glucose, Bld 105 (*)    All other components within normal limits  CBC - Abnormal; Notable for the following:    WBC 11.3 (*)    RBC 5.28 (*)    Hemoglobin 15.4 (*)    Platelets 451 (*)    All other components within normal limits  I-STAT TROPOININ, ED    Imaging Review Dg Chest 2 View  05/25/2015   CLINICAL DATA:  Pt reports chest heaviness that started last night. Pain radiates into L side of neck and L arm. Also c/o sob. Hx chf and mitral valve prolapse.  EXAM: CHEST  2 VIEW  COMPARISON:  11/29/2013  FINDINGS: The heart size and mediastinal contours are within normal limits. Both lungs are clear. No pleural effusion or pneumothorax. Bony thorax is intact.  IMPRESSION: No active cardiopulmonary disease.   Electronically Signed   By: Lajean Manes M.D.   On: 05/25/2015 21:50   I have personally reviewed and evaluated these images and lab results as part of my medical decision-making.   EKG Interpretation   Date/Time:  Sunday May 25 2015 20:53:57 EDT Ventricular Rate:  78 PR Interval:  120 QRS Duration: 92 QT Interval:  414 QTC Calculation: 471 R Axis:   130 Text Interpretation:  Normal sinus rhythm Left posterior fascicular block  Nonspecific T wave abnormality Prolonged QT Abnormal ECG Confirmed by  Johnney Killian, MD, Jeannie Done [phone]) on 05/25/2015  10:40:46 PM      MDM   Final diagnoses:  Chest pain, unspecified chest pain type  Addison's disease  Tobacco use   Patient presents with chest pain and history of cardiac cath showing diffuse atherosclerotic disease. Patient has additional risk factors of tobacco use and family history. The patient will be admitted at this point time for exertional type chest pain with risk factors for cardiac ischemic disease. The patient does not have acute chest pain at this time. She also has comorbid illness of Addison's disease.    Charlesetta Shanks, MD 05/25/15 2241

## 2015-05-25 NOTE — ED Notes (Signed)
Pt reports chest heaviness that started last night. Pain radiates into L side of neck and L arm. Also c/o sob. Hx chf and mitral valve prolapse.

## 2015-05-26 ENCOUNTER — Observation Stay (HOSPITAL_BASED_OUTPATIENT_CLINIC_OR_DEPARTMENT_OTHER): Payer: Medicare Other

## 2015-05-26 ENCOUNTER — Observation Stay (HOSPITAL_COMMUNITY): Payer: Medicare Other

## 2015-05-26 DIAGNOSIS — E039 Hypothyroidism, unspecified: Secondary | ICD-10-CM

## 2015-05-26 DIAGNOSIS — Z720 Tobacco use: Secondary | ICD-10-CM

## 2015-05-26 DIAGNOSIS — M542 Cervicalgia: Secondary | ICD-10-CM | POA: Diagnosis present

## 2015-05-26 DIAGNOSIS — F419 Anxiety disorder, unspecified: Secondary | ICD-10-CM | POA: Diagnosis not present

## 2015-05-26 DIAGNOSIS — R0789 Other chest pain: Secondary | ICD-10-CM | POA: Diagnosis not present

## 2015-05-26 DIAGNOSIS — R079 Chest pain, unspecified: Secondary | ICD-10-CM

## 2015-05-26 DIAGNOSIS — E271 Primary adrenocortical insufficiency: Secondary | ICD-10-CM

## 2015-05-26 DIAGNOSIS — R251 Tremor, unspecified: Secondary | ICD-10-CM | POA: Diagnosis not present

## 2015-05-26 DIAGNOSIS — M47812 Spondylosis without myelopathy or radiculopathy, cervical region: Secondary | ICD-10-CM | POA: Diagnosis not present

## 2015-05-26 LAB — TROPONIN I
Troponin I: <0.03 ng/mL (ref <0.031)
Troponin I: <0.03 ng/mL (ref <0.031)

## 2015-05-26 LAB — COMPREHENSIVE METABOLIC PANEL
ALT: 23 U/L (ref 14–54)
AST: 20 U/L (ref 15–41)
Albumin: 3.5 g/dL (ref 3.5–5.0)
Alkaline Phosphatase: 71 U/L (ref 38–126)
Anion gap: 9 (ref 5–15)
BUN: 8 mg/dL (ref 6–20)
CHLORIDE: 104 mmol/L (ref 101–111)
CO2: 26 mmol/L (ref 22–32)
CREATININE: 0.81 mg/dL (ref 0.44–1.00)
Calcium: 8.9 mg/dL (ref 8.9–10.3)
GFR calc Af Amer: >60 mL/min (ref >60)
GLUCOSE: 89 mg/dL (ref 65–99)
Potassium: 3.3 mmol/L — ABNORMAL LOW (ref 3.5–5.1)
Sodium: 139 mmol/L (ref 135–145)
Total Bilirubin: 0.6 mg/dL (ref 0.3–1.2)
Total Protein: 6.3 g/dL — ABNORMAL LOW (ref 6.5–8.1)

## 2015-05-26 LAB — CBC WITH DIFFERENTIAL/PLATELET
BASOS ABS: 0.1 10*3/uL (ref 0.0–0.1)
Basophils Relative: 1 %
EOS PCT: 3 %
Eosinophils Absolute: 0.4 10*3/uL (ref 0.0–0.7)
HCT: 42.5 % (ref 36.0–46.0)
Hemoglobin: 14.6 g/dL (ref 12.0–15.0)
LYMPHS PCT: 38 %
Lymphs Abs: 4.3 10*3/uL — ABNORMAL HIGH (ref 0.7–4.0)
MCH: 29 pg (ref 26.0–34.0)
MCHC: 34.4 g/dL (ref 30.0–36.0)
MCV: 84.5 fL (ref 78.0–100.0)
Monocytes Absolute: 0.9 10*3/uL (ref 0.1–1.0)
Monocytes Relative: 8 %
NEUTROS ABS: 5.6 10*3/uL (ref 1.7–7.7)
Neutrophils Relative %: 50 %
PLATELETS: 380 10*3/uL (ref 150–400)
RBC: 5.03 MIL/uL (ref 3.87–5.11)
RDW: 14.2 % (ref 11.5–15.5)
WBC: 11.2 10*3/uL — AB (ref 4.0–10.5)

## 2015-05-26 LAB — PROTIME-INR
INR: 1.12 (ref 0.00–1.49)
PROTHROMBIN TIME: 14.6 s (ref 11.6–15.2)

## 2015-05-26 MED ORDER — ZOLPIDEM TARTRATE 5 MG PO TABS
10.0000 mg | ORAL_TABLET | Freq: Every day | ORAL | Status: DC
  Administered 2015-05-26: 10 mg via ORAL
  Filled 2015-05-26: qty 2

## 2015-05-26 MED ORDER — ACETAMINOPHEN 325 MG PO TABS: 650.0000 mg | ORAL_TABLET | ORAL | Status: DC | PRN

## 2015-05-26 MED ORDER — LEVOTHYROXINE SODIUM 75 MCG PO TABS
75.0000 ug | ORAL_TABLET | Freq: Every day | ORAL | Status: DC
Start: 2015-05-26 — End: 2015-05-26
  Administered 2015-05-26: 75 ug via ORAL
  Filled 2015-05-26 (×2): qty 1

## 2015-05-26 MED ORDER — CYCLOBENZAPRINE HCL 10 MG PO TABS: 10.0000 mg | ORAL_TABLET | Freq: Three times a day (TID) | ORAL | Status: DC | PRN

## 2015-05-26 MED ORDER — ONDANSETRON HCL 4 MG/2ML IJ SOLN: 4.0000 mg | Freq: Four times a day (QID) | INTRAMUSCULAR | Status: DC | PRN

## 2015-05-26 MED ORDER — NITROGLYCERIN 0.4 MG SL SUBL: 0.4000 mg | SUBLINGUAL_TABLET | SUBLINGUAL | Status: DC | PRN

## 2015-05-26 MED ORDER — ALBUTEROL SULFATE HFA 108 (90 BASE) MCG/ACT IN AERS: 2.0000 | INHALATION_SPRAY | Freq: Four times a day (QID) | RESPIRATORY_TRACT | Status: DC | PRN

## 2015-05-26 MED ORDER — LORAZEPAM 1 MG PO TABS
2.0000 mg | ORAL_TABLET | Freq: Four times a day (QID) | ORAL | Status: DC | PRN
  Administered 2015-05-26 (×3): 2 mg via ORAL
  Filled 2015-05-26 (×3): qty 2

## 2015-05-26 MED ORDER — METOCLOPRAMIDE HCL 10 MG PO TABS
20.0000 mg | ORAL_TABLET | Freq: Three times a day (TID) | ORAL | Status: DC
  Administered 2015-05-26: 20 mg via ORAL
  Filled 2015-05-26: qty 2

## 2015-05-26 MED ORDER — ASPIRIN EC 81 MG PO TBEC
81.0000 mg | DELAYED_RELEASE_TABLET | Freq: Every day | ORAL | Status: DC
  Administered 2015-05-26: 81 mg via ORAL
  Filled 2015-05-26: qty 1

## 2015-05-26 MED ORDER — HYDROCORTISONE 10 MG PO TABS
10.0000 mg | ORAL_TABLET | Freq: Every day | ORAL | Status: DC
  Filled 2015-05-26: qty 1

## 2015-05-26 MED ORDER — HYDROCORTISONE 10 MG PO TABS: 10.0000 mg | ORAL_TABLET | Freq: Two times a day (BID) | ORAL | Status: DC

## 2015-05-26 MED ORDER — AMLODIPINE BESYLATE 5 MG PO TABS
5.0000 mg | ORAL_TABLET | Freq: Every day | ORAL | Status: DC
Start: 2015-05-26 — End: 2015-07-24

## 2015-05-26 MED ORDER — HYDROCORTISONE 20 MG PO TABS
20.0000 mg | ORAL_TABLET | Freq: Every day | ORAL | Status: DC
  Administered 2015-05-26: 20 mg via ORAL
  Filled 2015-05-26 (×2): qty 1

## 2015-05-26 MED ORDER — POTASSIUM CHLORIDE CRYS ER 20 MEQ PO TBCR
40.0000 meq | EXTENDED_RELEASE_TABLET | Freq: Once | ORAL | Status: AC
  Administered 2015-05-26: 40 meq via ORAL
  Filled 2015-05-26: qty 2

## 2015-05-26 MED ORDER — ENOXAPARIN SODIUM 40 MG/0.4ML ~~LOC~~ SOLN
40.0000 mg | Freq: Every day | SUBCUTANEOUS | Status: DC
  Administered 2015-05-26: 40 mg via SUBCUTANEOUS
  Filled 2015-05-26 (×2): qty 0.4

## 2015-05-26 MED ORDER — PRAMIPEXOLE DIHYDROCHLORIDE 0.125 MG PO TABS
0.1250 mg | ORAL_TABLET | Freq: Every evening | ORAL | Status: DC | PRN
  Filled 2015-05-26: qty 1

## 2015-05-26 MED ORDER — AMLODIPINE BESYLATE 5 MG PO TABS
5.0000 mg | ORAL_TABLET | Freq: Every day | ORAL | Status: DC
  Administered 2015-05-26: 5 mg via ORAL
  Filled 2015-05-26: qty 1

## 2015-05-26 MED ORDER — OXYCODONE HCL 5 MG PO TABS
30.0000 mg | ORAL_TABLET | Freq: Four times a day (QID) | ORAL | Status: DC | PRN
  Administered 2015-05-26 (×3): 30 mg via ORAL
  Filled 2015-05-26 (×3): qty 6

## 2015-05-26 NOTE — H&P (Signed)
[facility] History and Physical  Patient: [name]  MRN: [id number]  DOB: [birth year]  DOS: the patient was seen and examined on 05/25/2015 PCP: Glo Herring., MD  Referring physician: Dr. Vallery Ridge Chief Complaint: Chest pain  HPI: [name] is a 49 y.o. female with Past medical history of coronary artery disease, dyslipidemia, hypothyroidism, hypertension, adrenal insufficiency, chronic back pain. the patient is presenting with complaints of chest pain. Patient mentions that she was at her baseline yesterday morning in the afternoon she started having complaints of chest pain which is located centrally. The pain felt like heaviness and tightness. She also had some pain in her neck as well as left shoulder which was sharp and shooting electric shocklike pain. She denies having any tingling or numbness denies having any weakness. She denies having any fall trauma or injury. She denies having any headache or diarrhea or constipation or cough or fever or chills. No recent change in her medications reported.  The patient is coming from home.  At her baseline ambulates without any support And is independent for most of her ADL; manages her medication on her own.  Review of Systems: as mentioned in the history of present illness.  A comprehensive review of the other systems is negative.  Past Medical History  Diagnosis Date  . Cardiomyopathy     resolved  . Chest pain     chronicc  . Hyperlipidemia   . HTN (hypertension)   . Hypothyroidism   . Adrenal insufficiency     diagnosed 2012  . Anxiety   . Nondiabetic gastroparesis   . CAD (coronary artery disease)     Cath 2008 EF normal. RCA 50-60, Septal 50%. Myoview 3/12: EF 53% normal perfusion  . Chronic back pain   . QT prolongation   . Mitral valve prolapse   . Gastroparesis   . Chronic diarrhea   . Astigmatism   . Concussion     sept 28th 2014  . CHF (congestive heart failure)   . Cardiac arrest    2/2 adissonian crisis  . Tobacco abuse     down to 2 cigarettes per day   Past Surgical History  Procedure Laterality Date  . Cholecystectomy    . Spine surgery    . Vesicovaginal fistula closure w/ tah    . Varicose vein surgery    . Abdominal hysterectomy    . Cardiac catheterization  03/2007    showed 60% lesion in the right coronary artery  . Left heart catheterization with coronary angiogram N/A 04/13/2012    Procedure: LEFT HEART CATHETERIZATION WITH CORONARY ANGIOGRAM;  Surgeon: Hillary Bow, MD;  Location: [facility] CATH LAB;  Service: Cardiovascular;  Laterality: N/A;   Social History:  reports that she has been smoking Cigarettes.  She has a 5 pack-year smoking history. She has never used smokeless tobacco. She reports that she does not drink alcohol or use illicit drugs.  Allergies  Allergen Reactions  . Bee Venom Anaphylaxis  . Doxycycline Other (See Comments)    Due to Pre-Existing conditions involved with stomach, patient does not take the following medication  . Erythromycin Other (See Comments)    Due to Pre-Existing conditions involved with stomach, patient does not take the following medication  . Ibuprofen Nausea Only    gastroparesis  . Penicillins Anaphylaxis  . Cephalexin Diarrhea  . Dust Mite Extract Hives and Swelling  . Morphine And Related Itching  . Sulfonamide Derivatives Itching  . Tramadol Nausea Only  Family History  Problem Relation Age of Onset  . Coronary artery disease    . Heart attack Mother   . Cancer Father     Colon    Prior to Admission medications   Medication Sig Start Date End Date Taking? Authorizing Provider  aspirin 81 MG chewable tablet Chew 81 mg by mouth daily. Daily dose is 1 tab, takes 4 tabs if feeling chest pain or pressure   Yes Historical Provider, MD  cholecalciferol (VITAMIN D) 1000 UNITS tablet Take 1,000 Units by mouth daily.   Yes Historical Provider, MD  cyclobenzaprine (FLEXERIL) 10 MG tablet Take 10 mg by  mouth 3 (three) times daily as needed for muscle spasms.    Yes Historical Provider, MD  hydrochlorothiazide (HYDRODIURIL) 25 MG tablet Take 12.5 mg by mouth 2 (two) times daily as needed (Blood pressure rises).    Yes Historical Provider, MD  hydrocortisone (CORTEF) 10 MG tablet Take 10-20 mg by mouth 2 (two) times daily. Pt takes 20 mg in the morning and 10 mg at night   Yes Historical Provider, MD  levothyroxine (SYNTHROID, LEVOTHROID) 75 MCG tablet Take 75 mcg by mouth daily before breakfast.   Yes Historical Provider, MD  LORazepam (ATIVAN) 2 MG tablet Take 2 mg by mouth every 6 (six) hours as needed for anxiety.   Yes Historical Provider, MD  meclizine (ANTIVERT) 25 MG tablet Take 1 tablet (25 mg total) by mouth 4 (four) times daily as needed for dizziness. 06/15/13  Yes Rolland Porter, MD  metoCLOPramide (REGLAN) 10 MG tablet Take 20 mg by mouth 3 (three) times daily. Runny stools   Yes Historical Provider, MD  nitroGLYCERIN (NITROSTAT) 0.4 MG SL tablet Place 0.4 mg under the tongue every 5 (five) minutes as needed for chest pain.   Yes Historical Provider, MD  ondansetron (ZOFRAN) 4 MG tablet Take 4-8 mg by mouth every 4 (four) hours as needed for nausea.    Yes Historical Provider, MD  oxycodone (ROXICODONE) 30 MG immediate release tablet Take 30 mg by mouth every 6 (six) hours as needed for pain.    Yes Historical Provider, MD  pramipexole (MIRAPEX) 0.125 MG tablet Take 0.125 mg by mouth at bedtime as needed (restles leg syndrome).   Yes Historical Provider, MD  PROAIR HFA 108 (90 BASE) MCG/ACT inhaler Inhale 2 puffs into the lungs every 6 (six) hours as needed. 07/08/14  Yes Historical Provider, MD  zolpidem (AMBIEN) 10 MG tablet Take 10 mg by mouth at bedtime.  03/20/14  Yes Historical Provider, MD  levofloxacin (LEVAQUIN) 750 MG tablet Take 1 tablet (750 mg total) by mouth daily. 05/04/15   Dalia Heading, PA-C  oxyCODONE-acetaminophen (PERCOCET/ROXICET) 5-325 MG per tablet Take 1 tablet by  mouth every 6 (six) hours as needed for severe pain. Patient not taking: Reported on 05/25/2015 05/04/15   Dalia Heading, PA-C    Physical Exam: Filed Vitals:   05/26/15 0300 05/26/15 0400 05/26/15 0500 05/26/15 0526  BP: 154/87 157/89 163/105   Pulse: 63 71 71   Temp:    98.5 F (36.9 C)  TempSrc:    Oral  Resp: 13 12 18    Height:      Weight:      SpO2: 100% 96% 98%     General: Alert, Awake and Oriented to Time, Place and Person. Appear in mild distress Eyes: PERRL ENT: Oral Mucosa clear moist. Neck: no JVD Cardiovascular: S1 and S2 Present, no Murmur, Peripheral Pulses Present Respiratory: Bilateral Air entry  equal and Decreased,  Clear to Auscultation, no Crackles, no wheezes Abdomen: Bowel Sound present, Soft and no tenderness Skin: no Rash Extremities: no Pedal edema, no calf tenderness Neurologic: Grossly no focal neuro deficit other than facial tremors   Labs on Admission:  CBC:  Recent Labs Lab 05/25/15 2104  WBC 11.3*  HGB 15.4*  HCT 44.3  MCV 83.9  PLT 451*    CMP     Component Value Date/Time   NA 138 05/25/2015 2104   K 3.2* 05/25/2015 2104   CL 100* 05/25/2015 2104   CO2 26 05/25/2015 2104   GLUCOSE 105* 05/25/2015 2104   BUN 9 05/25/2015 2104   CREATININE 0.83 05/25/2015 2104   CALCIUM 8.9 05/25/2015 2104   PROT 8.0 08/04/2014 1012   ALBUMIN 3.8 08/04/2014 1012   AST 18 08/04/2014 1012   ALT 11 08/04/2014 1012   ALKPHOS 88 08/04/2014 1012   BILITOT 0.5 08/04/2014 1012   GFRNONAA >60 05/25/2015 2104   GFRAA >60 05/25/2015 2104     Recent Labs Lab 05/26/15 0055  TROPONINI <0.03   BNP (last 3 results) No results for input(s): BNP in the last 8760 hours.  ProBNP (last 3 results) No results for input(s): PROBNP in the last 8760 hours.   Radiological Exams on Admission: Dg Chest 2 View  05/25/2015   CLINICAL DATA:  Pt reports chest heaviness that started last night. Pain radiates into L side of neck and L arm. Also c/o sob.  Hx chf and mitral valve prolapse.  EXAM: CHEST  2 VIEW  COMPARISON:  11/29/2013  FINDINGS: The heart size and mediastinal contours are within normal limits. Both lungs are clear. No pleural effusion or pneumothorax. Bony thorax is intact.  IMPRESSION: No active cardiopulmonary disease.   Electronically Signed   By: Lajean Manes M.D.   On: 05/25/2015 21:50   Dg Cervical Spine Complete  05/26/2015   CLINICAL DATA:  Pain down the left side of the neck for 2 days. No trauma.  EXAM: CERVICAL SPINE  4+ VIEWS  COMPARISON:  CT cervical spine 02/03/2013  FINDINGS: Postoperative changes with anterior plate and screw fixation and intervertebral fusion from C5 through C7. Surgical hardware appears intact. Distal screw is not flush with the plate but this is unchanged since prior study. Few segments appear intact. No anterior subluxation. Normal alignment of the cervical spine. Degenerative changes mostly at C4-5 and at C7-T1. No vertebral compression deformities. No prevertebral soft tissue swelling. Normal alignment of the facet joints. No focal bone destruction.  IMPRESSION: Postoperative changes with anterior fixation and fusion from C5 through C7. Degenerative changes. No acute displaced fractures identified.   Electronically Signed   By: Lucienne Capers M.D.   On: 05/26/2015 01:16   EKG: Independently reviewed. normal sinus rhythm, nonspecific ST and T waves changes.  Assessment/Plan 1. Chest pain The patient is currently presenting with complaints of chest pain. No leg swelling or no hypoxia. The patient is currently chest pain-free. She continues to have his neck pain. EKG and initial troponins are unremarkable. With this the patient will be admitted in telemetry. I will continue her on cardiac monitoring, serial troponin, echo program in the morning. She remains nothing by mouth except medications. We will continue with aspirin as well.  2. Neck pain as well as shoulder pain. The patient is also  complaining of neck pain and shoulder pain. The pain is not reproducible on range of motion exercises. Although at present possibility of muscular pain  cannot be ruled out. Would check cervical spine x-ray as well as a attempt to use Flexeril for pain management.  3. Hypothyroidism Continue home medications. Check TSH in the morning.  4.Addison's disease Continuing with home dose of Cortef  5. CAD (coronary artery disease) Patient prior Showed 50-60% stenosis. At present we will continue to closely monitor her. Serial troponin and BX of medications.  6. GERD (gastroesophageal reflux disease) Currently not on any PPI. We will continue to closely monitor.  7 Anxiety patient appears significantly until examination. Continue with lorazepam at home doses.  8. Tremors of nervous system  patient mentions she is currently having an outpatient workup related to this. Continue close monitoring.  Nutrition: Nothing by mouth after midnight DVT Prophylaxis: subcutaneous Heparin  Advance goals of care discussion: Full code   Consults: Cardiology Family Communication: family was present at bedside, opportunity was given to ask question and all questions were answered satisfactorily at the time of interview. Disposition: Admitted as observation, telemetry unit. Estimated length of stay: One day  Author: Berle Mull, MD [facility] Pager: [phone] 05/25/2015  If 7PM-7AM, please contact night-coverage [email or web address] Password [email or web address]

## 2015-05-26 NOTE — ED Notes (Signed)
Pt requesting pain medication for neck pain; informed pt of next due oxycodone; pt states she will wait another hour for medications

## 2015-05-26 NOTE — Progress Notes (Signed)
  Echocardiogram 2D Echocardiogram has been performed.  Jennette Dubin 05/26/2015, 10:19 AM

## 2015-05-26 NOTE — Discharge Summary (Signed)
Discharge Summary  [name] CBJ:[id number] DOB: 1966-03-05  PCP: Glo Herring., MD  Admit date: 05/25/2015 Discharge date: 05/26/2015  Time spent: 25 minutes  Recommendations for Outpatient Follow-up:  1. New medication: Norvasc 5 mg by mouth daily 2. Patient will follow-up with [facility] in the next few weeks   Discharge Diagnoses:  Active Hospital Problems   Diagnosis Date Noted  . Chest pain 05/25/2015  . Anxiety 05/26/2015  . Tremors of nervous system 05/26/2015  . Neck pain 05/26/2015  . CAD (coronary artery disease) 04/11/2012  . GERD (gastroesophageal reflux disease) 04/11/2012  . Addison's disease 03/03/2012  . Palpitations 01/01/2009  . Hypothyroidism 12/31/2008    Resolved Hospital Problems   Diagnosis Date Noted Date Resolved  No resolved problems to display.    Discharge Condition: Improved, being discharged home  Diet recommendation: Heart healthy   Filed Weights   05/25/15 2104  Weight: 86.183 kg (190 lb)    History of present illness: 49 year female past history of CAD, ischemic cardiopathy and hypothyroidism came to the emergency room on 9/18 complaining of chest pain and pain and shoulder pain which she said had been going on for one day. EKG and initial troponins were negative. Patient responded well to a muscle relaxer. Because of her history of ischemic cardiomyopathy, felt best that she come in for further evaluation.    Hospital Course:  Principal Problem: Musculoskeletal pain: Patient was seen by cardiology who after close evaluation and repeated negative troponins, felt that this was not cardiac related and may be more musculoskeletal. An echocardiogram was ordered and completed prior to discharge.  Patient started on Norvasc for elevated blood pressures. She will follow-up with cardiology as outpatient Active Problems:   Hypothyroidism: Continue Synthroid Tobacco abuse: Patient counseled both by myself and cardiology   Addison's  disease: Continue daily steroids   CAD (coronary artery disease)/ischemic cardiopathy: Echocardiogram completed with results pending   GERD (gastroesophageal reflux disease)   Anxiety: Continue benzodiazepine's when necessary   Tremors of nervous system   Neck pain: Felt to be secondary to musculoskeletal Hypertension: Patient started on Norvasc.   Procedures:Echocardiogram done 9/19: Results pending  Consultations:  Cardiology   Discharge Exam: BP 152/100 mmHg  Pulse 63  Temp(Src) 98.5 F (36.9 C) (Oral)  Resp 14  Ht 5\' 5"  (1.651 m)  Wt 86.183 kg (190 lb)  BMI 31.62 kg/m2  SpO2 99%  General: alert and oriented 3, no acute distress  Cardiovascular: regular rate and rhythm, S1-S2  Respiratory: Clear to auscultation bilaterally   Discharge Instructions You were cared for by a hospitalist during your hospital stay. If you have any questions about your discharge medications or the care you received while you were in the hospital after you are discharged, you can call the unit and asked to speak with the hospitalist on call if the hospitalist that took care of you is not available. Once you are discharged, your primary care physician will handle any further medical issues. Please note that NO REFILLS for any discharge medications will be authorized once you are discharged, as it is imperative that you return to your primary care physician (or establish a relationship with a primary care physician if you do not have one) for your aftercare needs so that they can reassess your need for medications and monitor your lab values.  Discharge Instructions    Diet - low sodium heart healthy    Complete by:  As directed  Increase activity slowly    Complete by:  As directed             Medication List    STOP taking these medications        levofloxacin 750 MG tablet  Commonly known as:  LEVAQUIN     oxyCODONE-acetaminophen 5-325 MG per tablet  Commonly known as:   PERCOCET/ROXICET      TAKE these medications        amLODipine 5 MG tablet  Commonly known as:  NORVASC  Take 1 tablet (5 mg total) by mouth daily.     aspirin 81 MG chewable tablet  Chew 81 mg by mouth daily. Daily dose is 1 tab, takes 4 tabs if feeling chest pain or pressure     cholecalciferol 1000 UNITS tablet  Commonly known as:  VITAMIN D  Take 1,000 Units by mouth daily.     cyclobenzaprine 10 MG tablet  Commonly known as:  FLEXERIL  Take 10 mg by mouth 3 (three) times daily as needed for muscle spasms.     hydrochlorothiazide 25 MG tablet  Commonly known as:  HYDRODIURIL  Take 12.5 mg by mouth 2 (two) times daily as needed (Blood pressure rises).     hydrocortisone 10 MG tablet  Commonly known as:  CORTEF  Take 10-20 mg by mouth 2 (two) times daily. Pt takes 20 mg in the morning and 10 mg at night     levothyroxine 75 MCG tablet  Commonly known as:  SYNTHROID, LEVOTHROID  Take 75 mcg by mouth daily before breakfast.     LORazepam 2 MG tablet  Commonly known as:  ATIVAN  Take 2 mg by mouth every 6 (six) hours as needed for anxiety.     meclizine 25 MG tablet  Commonly known as:  ANTIVERT  Take 1 tablet (25 mg total) by mouth 4 (four) times daily as needed for dizziness.     metoCLOPramide 10 MG tablet  Commonly known as:  REGLAN  Take 20 mg by mouth 3 (three) times daily. Runny stools     nitroGLYCERIN 0.4 MG SL tablet  Commonly known as:  NITROSTAT  Place 0.4 mg under the tongue every 5 (five) minutes as needed for chest pain.     ondansetron 4 MG tablet  Commonly known as:  ZOFRAN  Take 4-8 mg by mouth every 4 (four) hours as needed for nausea.     oxycodone 30 MG immediate release tablet  Commonly known as:  ROXICODONE  Take 30 mg by mouth every 6 (six) hours as needed for pain.     pramipexole 0.125 MG tablet  Commonly known as:  MIRAPEX  Take 0.125 mg by mouth at bedtime as needed (restles leg syndrome).     PROAIR HFA 108 (90 BASE) MCG/ACT  inhaler  Generic drug:  albuterol  Inhale 2 puffs into the lungs every 6 (six) hours as needed.     zolpidem 10 MG tablet  Commonly known as:  AMBIEN  Take 10 mg by mouth at bedtime.       Allergies  Allergen Reactions  . Bee Venom Anaphylaxis  . Doxycycline Other (See Comments)    Due to Pre-Existing conditions involved with stomach, patient does not take the following medication  . Erythromycin Other (See Comments)    Due to Pre-Existing conditions involved with stomach, patient does not take the following medication  . Ibuprofen Nausea Only    gastroparesis  . Penicillins Anaphylaxis  . Cephalexin  Diarrhea  . Dust Mite Extract Hives and Swelling  . Morphine And Related Itching  . Sulfonamide Derivatives Itching  . Tramadol Nausea Only       Follow-up Information    Follow up with Richardson Dopp, PA-C In 2 weeks.   Specialties:  Physician Assistant, Radiology, Interventional Cardiology   Why:  Call the office for a follow-up appointment, can see any midlevel provider   Contact information:   [address] [place] [address] [phone]        The results of significant diagnostics from this hospitalization (including imaging, microbiology, ancillary and laboratory) are listed below for reference.    Significant Diagnostic Studies: Dg Chest 2 View  05/25/2015   CLINICAL DATA:  Pt reports chest heaviness that started last night. Pain radiates into L side of neck and L arm. Also c/o sob. Hx chf and mitral valve prolapse.  EXAM: CHEST  2 VIEW  COMPARISON:  11/29/2013  FINDINGS: The heart size and mediastinal contours are within normal limits. Both lungs are clear. No pleural effusion or pneumothorax. Bony thorax is intact.  IMPRESSION: No active cardiopulmonary disease.   Electronically Signed   By: Lajean Manes M.D.   On: 05/25/2015 21:50   Dg Cervical Spine Complete  05/26/2015   CLINICAL DATA:  Pain down the left side of the neck for 2 days. No  trauma.  EXAM: CERVICAL SPINE  4+ VIEWS  COMPARISON:  CT cervical spine 02/03/2013  FINDINGS: Postoperative changes with anterior plate and screw fixation and intervertebral fusion from C5 through C7. Surgical hardware appears intact. Distal screw is not flush with the plate but this is unchanged since prior study. Few segments appear intact. No anterior subluxation. Normal alignment of the cervical spine. Degenerative changes mostly at C4-5 and at C7-T1. No vertebral compression deformities. No prevertebral soft tissue swelling. Normal alignment of the facet joints. No focal bone destruction.  IMPRESSION: Postoperative changes with anterior fixation and fusion from C5 through C7. Degenerative changes. No acute displaced fractures identified.   Electronically Signed   By: Lucienne Capers M.D.   On: 05/26/2015 01:16   Ct Maxillofacial Wo Cm  05/04/2015   CLINICAL DATA:  Struck in the right eye with baseball today. Initial encounter.  EXAM: CT MAXILLOFACIAL WITHOUT CONTRAST  TECHNIQUE: Multidetector CT imaging of the maxillofacial structures was performed. Multiplanar CT image reconstructions were also generated. A small metallic BB was placed on the right temple in order to reliably differentiate right from left.  COMPARISON:  CT 08/24/2013.  FINDINGS: There is a comminuted fracture of the anterior wall of the right maxillary sinus. This fracture demonstrates up to 6 mm of depression. No orbital floor or other orbital fracture demonstrated. There is no other evidence of acute facial fracture. The mandible is intact.  Chronic asymmetric right TMJ degenerative changes are noted. The mastoid air cells and middle ears are clear. There is blood within the right maxillary sinus. The additional paranasal sinuses are clear. Previous cervical fusion noted.  There is mild periorbital soft tissue swelling on the right. No postseptal hematoma identified. The globes are intact. The optic nerves and extraocular muscles appear  normal.  IMPRESSION: 1. Depressed and mildly comminuted fracture of the anterior wall of the right maxillary sinus. 2. No evidence of orbital or other facial fracture. 3. Periorbital soft tissue swelling on the right. No evidence of globe injury.   Electronically Signed   By: Richardean Sale M.D.   On: 05/04/2015  18:43    Microbiology: No results found for this or any previous visit (from the past 240 hour(s)).   Labs: Basic Metabolic Panel:  Recent Labs Lab 05/25/15 2104 05/26/15 0525  NA 138 139  K 3.2* 3.3*  CL 100* 104  CO2 26 26  GLUCOSE 105* 89  BUN 9 8  CREATININE 0.83 0.81  CALCIUM 8.9 8.9   Liver Function Tests:  Recent Labs Lab 05/26/15 0525  AST 20  ALT 23  ALKPHOS 71  BILITOT 0.6  PROT 6.3*  ALBUMIN 3.5   No results for input(s): LIPASE, AMYLASE in the last 168 hours. No results for input(s): AMMONIA in the last 168 hours. CBC:  Recent Labs Lab 05/25/15 2104 05/26/15 0525  WBC 11.3* 11.2*  NEUTROABS  --  5.6  HGB 15.4* 14.6  HCT 44.3 42.5  MCV 83.9 84.5  PLT 451* 380   Cardiac Enzymes:  Recent Labs Lab 05/26/15 0055 05/26/15 0525  TROPONINI <0.03 <0.03   BNP: BNP (last 3 results) No results for input(s): BNP in the last 8760 hours.  ProBNP (last 3 results) No results for input(s): PROBNP in the last 8760 hours.  CBG: No results for input(s): GLUCAP in the last 168 hours.     Signed:  Annita Brod  [facility] 05/26/2015, 10:46 AM

## 2015-05-26 NOTE — Consult Note (Signed)
CONSULTATION NOTE  Reason for Consult: Chest pain  Requesting Physician: Dr. Maryland Pink  Cardiologist: Dr. Haroldine Laws  HPI: [name] with a past medical history significant for hypertension, dyslipidemia, tobacco abuse and chest pain in the past without any known coronary artery disease. In 2013, she underwent left heart catheterization which showed preserved LV function and no significant obstructive coronary disease. Previously it had been reported that there was a 50-60% stenosis of the mid RCA which was noted to be completely resolved in 2013, favoring the diagnosis of coronary spasm. She also has a history of Addison's disease, hypothyroidism, prolonged QT interval and a remote history of nonischemic cardiomyopathy with EF 40-45%, improved and normalized recently. She was seen in consultation in 2014 for chest pressure.  Her chest pain was felt to be noncardiac at that time and she had presented for a number of similar complaints prior to that.   She now presents with heaviness and tightness in her chest. She reports the pain goes to her neck as well as her left shoulder, sharp and shooting, like a electric shock. The pain came on fairly suddenly. In the emergency department, workup including EKG (which was normal sinus rhythm, left posterior fascicular block and nonspecific T-wave changes) and troponins were negative 3.. The patient was noted to be chest pain-free, when examined by internal medicine.  PMHx:  Past Medical History  Diagnosis Date  . Cardiomyopathy     resolved  . Chest pain     chronicc  . Hyperlipidemia   . HTN (hypertension)   . Hypothyroidism   . Adrenal insufficiency     diagnosed 2012  . Anxiety   . Nondiabetic gastroparesis   . CAD (coronary artery disease)     Cath 2008 EF normal. RCA 50-60, Septal 50%. Myoview 3/12: EF 53% normal perfusion  . Chronic back pain   . QT prolongation   . Mitral valve prolapse   . Gastroparesis   .  Chronic diarrhea   . Astigmatism   . Concussion     sept 28th 2014  . CHF (congestive heart failure)   . Cardiac arrest     2/2 adissonian crisis  . Tobacco abuse     down to 2 cigarettes per day   Past Surgical History  Procedure Laterality Date  . Cholecystectomy    . Spine surgery    . Vesicovaginal fistula closure w/ tah    . Varicose vein surgery    . Abdominal hysterectomy    . Cardiac catheterization  03/2007    showed 60% lesion in the right coronary artery  . Left heart catheterization with coronary angiogram N/A 04/13/2012    Procedure: LEFT HEART CATHETERIZATION WITH CORONARY ANGIOGRAM;  Surgeon: Hillary Bow, MD;  Location: [facility] CATH LAB;  Service: Cardiovascular;  Laterality: N/A;    FAMHx: Family History  Problem Relation Age of Onset  . Coronary artery disease    . Heart attack Mother   . Cancer Father     Colon    SOCHx:  reports that she has been smoking Cigarettes.  She has a 5 pack-year smoking history. She has never used smokeless tobacco. She reports that she does not drink alcohol or use illicit drugs.  ALLERGIES: Allergies  Allergen Reactions  . Bee Venom Anaphylaxis  . Doxycycline Other (See Comments)    Due to Pre-Existing conditions involved with stomach, patient does not take the following medication  . Erythromycin Other (See Comments)  Due to Pre-Existing conditions involved with stomach, patient does not take the following medication  . Ibuprofen Nausea Only    gastroparesis  . Penicillins Anaphylaxis  . Cephalexin Diarrhea  . Dust Mite Extract Hives and Swelling  . Morphine And Related Itching  . Sulfonamide Derivatives Itching  . Tramadol Nausea Only    ROS: A comprehensive review of systems was negative except for: Constitutional: positive for fatigue Cardiovascular: positive for chest pain Musculoskeletal: positive for myalgias and neck pain Behavioral/Psych: positive for anxiety  HOME MEDICATIONS:   Medication List      ASK your doctor about these medications        aspirin 81 MG chewable tablet  Chew 81 mg by mouth daily. Daily dose is 1 tab, takes 4 tabs if feeling chest pain or pressure     cholecalciferol 1000 UNITS tablet  Commonly known as:  VITAMIN D  Take 1,000 Units by mouth daily.     cyclobenzaprine 10 MG tablet  Commonly known as:  FLEXERIL  Take 10 mg by mouth 3 (three) times daily as needed for muscle spasms.     hydrochlorothiazide 25 MG tablet  Commonly known as:  HYDRODIURIL  Take 12.5 mg by mouth 2 (two) times daily as needed (Blood pressure rises).     hydrocortisone 10 MG tablet  Commonly known as:  CORTEF  Take 10-20 mg by mouth 2 (two) times daily. Pt takes 20 mg in the morning and 10 mg at night     levofloxacin 750 MG tablet  Commonly known as:  LEVAQUIN  Take 1 tablet (750 mg total) by mouth daily.     levothyroxine 75 MCG tablet  Commonly known as:  SYNTHROID, LEVOTHROID  Take 75 mcg by mouth daily before breakfast.     LORazepam 2 MG tablet  Commonly known as:  ATIVAN  Take 2 mg by mouth every 6 (six) hours as needed for anxiety.     meclizine 25 MG tablet  Commonly known as:  ANTIVERT  Take 1 tablet (25 mg total) by mouth 4 (four) times daily as needed for dizziness.     metoCLOPramide 10 MG tablet  Commonly known as:  REGLAN  Take 20 mg by mouth 3 (three) times daily. Runny stools     nitroGLYCERIN 0.4 MG SL tablet  Commonly known as:  NITROSTAT  Place 0.4 mg under the tongue every 5 (five) minutes as needed for chest pain.     ondansetron 4 MG tablet  Commonly known as:  ZOFRAN  Take 4-8 mg by mouth every 4 (four) hours as needed for nausea.     oxycodone 30 MG immediate release tablet  Commonly known as:  ROXICODONE  Take 30 mg by mouth every 6 (six) hours as needed for pain.     oxyCODONE-acetaminophen 5-325 MG per tablet  Commonly known as:  PERCOCET/ROXICET  Take 1 tablet by mouth every 6 (six) hours as needed for severe pain.      pramipexole 0.125 MG tablet  Commonly known as:  MIRAPEX  Take 0.125 mg by mouth at bedtime as needed (restles leg syndrome).     PROAIR HFA 108 (90 BASE) MCG/ACT inhaler  Generic drug:  albuterol  Inhale 2 puffs into the lungs every 6 (six) hours as needed.     zolpidem 10 MG tablet  Commonly known as:  AMBIEN  Take 10 mg by mouth at bedtime.        HOSPITAL MEDICATIONS: Prior to Admission:  (Not  in a hospital admission)  VITALS: Blood pressure 141/89, pulse 76, temperature 98.5 F (36.9 C), temperature source Oral, resp. rate 23, height _0  (1.651 m), weight 190 lb (86.183 kg), SpO2 97 %.  PHYSICAL EXAM: General appearance: alert and mildly anxious Neck: no carotid bruit, no JVD and mild TTP over the left trapezius Lungs: clear to auscultation bilaterally Heart: regular rate and rhythm, S1, S2 normal, no murmur, click, rub or gallop Abdomen: soft, non-tender; bowel sounds normal; no masses,  no organomegaly Extremities: extremities normal, atraumatic, no cyanosis or edema Pulses: 2+ and symmetric Skin: Skin color, texture, turgor normal. No rashes or lesions Neurologic: Grossly normal Psych: Pleasant  LABS: Results for orders placed or performed during the hospital encounter of 05/25/15 (from the past 48 hour(s))  Basic metabolic panel     Status: Abnormal   Collection Time: 05/25/15  9:04 PM  Result Value Ref Range   Sodium 138 135 - 145 mmol/L   Potassium 3.2 (L) 3.5 - 5.1 mmol/L   Chloride 100 (L) 101 - 111 mmol/L   CO2 26 22 - 32 mmol/L   Glucose, Bld 105 (H) 65 - 99 mg/dL   BUN 9 6 - 20 mg/dL   Creatinine, Ser 0.83 0.44 - 1.00 mg/dL   Calcium 8.9 8.9 - 10.3 mg/dL   GFR calc non Af Amer >60 >60 mL/min   GFR calc Af Amer >60 >60 mL/min    Comment: (NOTE) The eGFR has been calculated using the CKD EPI equation. This calculation has not been validated in all clinical situations. eGFR's persistently <60 mL/min signify possible Chronic Kidney Disease.     Anion gap 12 5 - 15  CBC     Status: Abnormal   Collection Time: 05/25/15  9:04 PM  Result Value Ref Range   WBC 11.3 (H) 4.0 - 10.5 K/uL   RBC 5.28 (H) 3.87 - 5.11 MIL/uL   Hemoglobin 15.4 (H) 12.0 - 15.0 g/dL   HCT 44.3 36.0 - 46.0 %   MCV 83.9 78.0 - 100.0 fL   MCH 29.2 26.0 - 34.0 pg   MCHC 34.8 30.0 - 36.0 g/dL   RDW 14.1 11.5 - 15.5 %   Platelets 451 (H) 150 - 400 K/uL  I-stat troponin, ED     Status: None   Collection Time: 05/25/15  9:26 PM  Result Value Ref Range   Troponin i, poc 0.00 0.00 - 0.08 ng/mL   Comment 3            Comment: Due to the release kinetics of cTnI, a negative result within the first hours of the onset of symptoms does not rule out myocardial infarction with certainty. If myocardial infarction is still suspected, repeat the test at appropriate intervals.   Troponin I (q 6hr x 3)     Status: None   Collection Time: 05/26/15 12:55 AM  Result Value Ref Range   Troponin I <0.03 <0.031 ng/mL    Comment:        NO INDICATION OF MYOCARDIAL INJURY.   Troponin I (q 6hr x 3)     Status: None   Collection Time: 05/26/15  5:25 AM  Result Value Ref Range   Troponin I <0.03 <0.031 ng/mL    Comment:        NO INDICATION OF MYOCARDIAL INJURY.   CBC with Differential/Platelet     Status: Abnormal   Collection Time: 05/26/15  5:25 AM  Result Value Ref Range   WBC 11.2 (  H) 4.0 - 10.5 K/uL   RBC 5.03 3.87 - 5.11 MIL/uL   Hemoglobin 14.6 12.0 - 15.0 g/dL   HCT 42.5 36.0 - 46.0 %   MCV 84.5 78.0 - 100.0 fL   MCH 29.0 26.0 - 34.0 pg   MCHC 34.4 30.0 - 36.0 g/dL   RDW 14.2 11.5 - 15.5 %   Platelets 380 150 - 400 K/uL   Neutrophils Relative % 50 %   Neutro Abs 5.6 1.7 - 7.7 K/uL   Lymphocytes Relative 38 %   Lymphs Abs 4.3 (H) 0.7 - 4.0 K/uL   Monocytes Relative 8 %   Monocytes Absolute 0.9 0.1 - 1.0 K/uL   Eosinophils Relative 3 %   Eosinophils Absolute 0.4 0.0 - 0.7 K/uL   Basophils Relative 1 %   Basophils Absolute 0.1 0.0 - 0.1 K/uL    Comprehensive metabolic panel     Status: Abnormal   Collection Time: 05/26/15  5:25 AM  Result Value Ref Range   Sodium 139 135 - 145 mmol/L   Potassium 3.3 (L) 3.5 - 5.1 mmol/L   Chloride 104 101 - 111 mmol/L   CO2 26 22 - 32 mmol/L   Glucose, Bld 89 65 - 99 mg/dL   BUN 8 6 - 20 mg/dL   Creatinine, Ser 0.81 0.44 - 1.00 mg/dL   Calcium 8.9 8.9 - 10.3 mg/dL   Total Protein 6.3 (L) 6.5 - 8.1 g/dL   Albumin 3.5 3.5 - 5.0 g/dL   AST 20 15 - 41 U/L   ALT 23 14 - 54 U/L   Alkaline Phosphatase 71 38 - 126 U/L   Total Bilirubin 0.6 0.3 - 1.2 mg/dL   GFR calc non Af Amer >60 >60 mL/min   GFR calc Af Amer >60 >60 mL/min    Comment: (NOTE) The eGFR has been calculated using the CKD EPI equation. This calculation has not been validated in all clinical situations. eGFR's persistently <60 mL/min signify possible Chronic Kidney Disease.    Anion gap 9 5 - 15  Protime-INR     Status: None   Collection Time: 05/26/15  5:25 AM  Result Value Ref Range   Prothrombin Time 14.6 11.6 - 15.2 seconds   INR 1.12 0.00 - 1.49    IMAGING: Dg Chest 2 View  05/25/2015   CLINICAL DATA:  Pt reports chest heaviness that started last night. Pain radiates into L side of neck and L arm. Also c/o sob. Hx chf and mitral valve prolapse.  EXAM: CHEST  2 VIEW  COMPARISON:  11/29/2013  FINDINGS: The heart size and mediastinal contours are within normal limits. Both lungs are clear. No pleural effusion or pneumothorax. Bony thorax is intact.  IMPRESSION: No active cardiopulmonary disease.   Electronically Signed   By: Lajean Manes M.D.   On: 05/25/2015 21:50   Dg Cervical Spine Complete  05/26/2015   CLINICAL DATA:  Pain down the left side of the neck for 2 days. No trauma.  EXAM: CERVICAL SPINE  4+ VIEWS  COMPARISON:  CT cervical spine 02/03/2013  FINDINGS: Postoperative changes with anterior plate and screw fixation and intervertebral fusion from C5 through C7. Surgical hardware appears intact. Distal screw is not  flush with the plate but this is unchanged since prior study. Few segments appear intact. No anterior subluxation. Normal alignment of the cervical spine. Degenerative changes mostly at C4-5 and at C7-T1. No vertebral compression deformities. No prevertebral soft tissue swelling. Normal alignment of the facet  joints. No focal bone destruction.  IMPRESSION: Postoperative changes with anterior fixation and fusion from C5 through C7. Degenerative changes. No acute displaced fractures identified.   Electronically Signed   By: Lucienne Capers M.D.   On: 05/26/2015 01:16    HOSPITAL DIAGNOSES: Principal Problem:   Chest pain Active Problems:   Hypothyroidism   Palpitations   Addison's disease   CAD (coronary artery disease)   GERD (gastroesophageal reflux disease)   Anxiety   Tremors of nervous system   Neck pain   IMPRESSION: 1. Musculoskeletal chest and neck pain 2. History of coronary spasm 3. Anxiety  RECOMMENDATION: [name] is describing what sounds like musculoskeletal chest and neck pain. This may be amenable to treatment with a muscle relaxant. Troponins of been negative and EKG is unchanged. She does have a history of intermittent right coronary artery stenosis which was consistent with coronary spasm. She's been taken off of a number of her blood pressure medications due to hypotension. Blood pressure is elevated at this time. I think she would benefit from low-dose calcium channel blocker. I would recommend amlodipine 5 mg daily. She will also need a new prescription for nitroglycerin which she has misplaced. She could use this as needed for chest pain. I spent greater than 5 minutes discussing smoking cessation which is critically important if she has underlying coronary artery spasm. She says she has been able to cut back some on her smoking. I believe she could be safely discharged today with cardiology clinic follow-up with a mid-level provider and ultimately with Dr.  Haroldine Laws.  Thanks for the consultation.  Time Spent Directly with Patient: 30 minutes  Pixie Casino, MD, [facility] Attending Cardiologist [place] 05/26/2015, 8:03 AM

## 2015-05-26 NOTE — ED Notes (Signed)
Spoke with Doctor ordered patient to be discharged.

## 2015-05-26 NOTE — ED Notes (Signed)
Paged admitting/discharge Doctor.

## 2015-05-26 NOTE — Discharge Instructions (Signed)
Amlodipine tablets °What is this medicine? °AMLODIPINE (am LOE di peen) is a calcium-channel blocker. It affects the amount of calcium found in your heart and muscle cells. This relaxes your blood vessels, which can reduce the amount of work the heart has to do. This medicine is used to lower high blood pressure. It is also used to prevent chest pain. °This medicine may be used for other purposes; ask your health care provider or pharmacist if you have questions. °COMMON BRAND NAME(S): Norvasc °What should I tell my health care provider before I take this medicine? °They need to know if you have any of these conditions: °-heart problems like heart failure or aortic stenosis °-liver disease °-an unusual or allergic reaction to amlodipine, other medicines, foods, dyes, or preservatives °-pregnant or trying to get pregnant °-breast-feeding °How should I use this medicine? °Take this medicine by mouth with a glass of water. Follow the directions on the prescription label. Take your medicine at regular intervals. Do not take more medicine than directed. °Talk to your pediatrician regarding the use of this medicine in children. Special care may be needed. This medicine has been used in children as young as 6. °Persons over 65 years old may have a stronger reaction to this medicine and need smaller doses. °Overdosage: If you think you have taken too much of this medicine contact a poison control center or emergency room at once. °NOTE: This medicine is only for you. Do not share this medicine with others. °What if I miss a dose? °If you miss a dose, take it as soon as you can. If it is almost time for your next dose, take only that dose. Do not take double or extra doses. °What may interact with this medicine? °-herbal or dietary supplements °-local or general anesthetics °-medicines for high blood pressure °-medicines for prostate problems °-rifampin °This list may not describe all possible interactions. Give your health  care provider a list of all the medicines, herbs, non-prescription drugs, or dietary supplements you use. Also tell them if you smoke, drink alcohol, or use illegal drugs. Some items may interact with your medicine. °What should I watch for while using this medicine? °Visit your doctor or health care professional for regular check ups. Check your blood pressure and pulse rate regularly. Ask your health care professional what your blood pressure and pulse rate should be, and when you should contact him or her. °This medicine may make you feel confused, dizzy or lightheaded. Do not drive, use machinery, or do anything that needs mental alertness until you know how this medicine affects you. To reduce the risk of dizzy or fainting spells, do not sit or stand up quickly, especially if you are an older patient. Avoid alcoholic drinks; they can make you more dizzy. °Do not suddenly stop taking amlodipine. Ask your doctor or health care professional how you can gradually reduce the dose. °What side effects may I notice from receiving this medicine? °Side effects that you should report to your doctor or health care professional as soon as possible: °-allergic reactions like skin rash, itching or hives, swelling of the face, lips, or tongue °-breathing problems °-changes in vision or hearing °-chest pain °-fast, irregular heartbeat °-swelling of legs or ankles °Side effects that usually do not require medical attention (report to your doctor or health care professional if they continue or are bothersome): °-dry mouth °-facial flushing °-nausea, vomiting °-stomach gas, pain °-tired, weak °-trouble sleeping °This list may not describe all possible side   effects. Call your doctor for medical advice about side effects. You may report side effects to FDA at 1-800-FDA-1088. °Where should I keep my medicine? °Keep out of the reach of children. °Store at room temperature between 59 and 86 degrees F (15 and 30 degrees C). Protect from  light. Keep container tightly closed. Throw away any unused medicine after the expiration date. °NOTE: This sheet is a summary. It may not cover all possible information. If you have questions about this medicine, talk to your doctor, pharmacist, or health care provider. °© 2015, [employer]. (2012-07-21 11:40:58) ° °

## 2015-06-02 ENCOUNTER — Ambulatory Visit (HOSPITAL_COMMUNITY): Payer: Medicare Other

## 2015-06-04 ENCOUNTER — Ambulatory Visit (HOSPITAL_COMMUNITY): Payer: Medicare Other

## 2015-06-09 ENCOUNTER — Ambulatory Visit (HOSPITAL_COMMUNITY)
Admission: RE | Admit: 2015-06-09 | Discharge: 2015-06-09 | Disposition: A | Discharge status: Home / Self Care | Payer: Medicare Other | Source: Ambulatory Visit | Attending: Internal Medicine | Admitting: Internal Medicine

## 2015-06-09 DIAGNOSIS — Z1231 Encounter for screening mammogram for malignant neoplasm of breast: Secondary | ICD-10-CM | POA: Insufficient documentation

## 2015-06-13 ENCOUNTER — Encounter: Payer: Self-pay | Admitting: Internal Medicine

## 2015-06-13 ENCOUNTER — Ambulatory Visit (INDEPENDENT_AMBULATORY_CARE_PROVIDER_SITE_OTHER): Payer: Medicare Other | Admitting: Internal Medicine

## 2015-06-13 VITALS — BP 100/72 | HR 84 | Ht 65.5 in | Wt 195.4 lb

## 2015-06-13 DIAGNOSIS — I10 Essential (primary) hypertension: Secondary | ICD-10-CM | POA: Diagnosis not present

## 2015-06-13 DIAGNOSIS — R0689 Other abnormalities of breathing: Secondary | ICD-10-CM | POA: Diagnosis not present

## 2015-06-13 DIAGNOSIS — R5383 Other fatigue: Secondary | ICD-10-CM | POA: Diagnosis not present

## 2015-06-13 DIAGNOSIS — R079 Chest pain, unspecified: Secondary | ICD-10-CM

## 2015-06-13 DIAGNOSIS — F419 Anxiety disorder, unspecified: Secondary | ICD-10-CM

## 2015-06-13 DIAGNOSIS — E785 Hyperlipidemia, unspecified: Secondary | ICD-10-CM | POA: Diagnosis not present

## 2015-06-13 NOTE — Progress Notes (Signed)
OFFICE NOTE  Chief Complaint:  Hospital follow-up, fatigue, neck pain  Primary Care Physician: Glo Herring., MD  HPI:  [name] is a 49 yo female with a past medical history significant for hypertension, dyslipidemia, tobacco abuse and chest pain in the past without any known coronary artery disease. In 2013, she underwent left heart catheterization which showed preserved LV function and no significant obstructive coronary disease. Previously it had been reported that there was a 50-60% stenosis of the mid RCA which was noted to be completely resolved in 2013, favoring the diagnosis of coronary spasm. She also has a history of Addison's disease, hypothyroidism, prolonged QT interval and a remote history of nonischemic cardiomyopathy with EF 40-45%, improved and normalized recently. She was seen in consultation in 2014 for chest pressure. Her chest pain was felt to be noncardiac at that time and she had presented for a number of similar complaints prior to that. She now presents with heaviness and tightness in her chest. She reports the pain goes to her neck as well as her left shoulder, sharp and shooting, like a electric shock. The pain came on fairly suddenly. In the emergency department, workup including EKG (which was normal sinus rhythm, left posterior fascicular block and nonspecific T-wave changes) and troponins were negative 3.. The patient was noted to be chest pain-free, when examined by internal medicine.  [name] ruled out for acute MI and her symptoms were improved with muscle relaxants. I recommended starting low-dose calcium channel blocker because of the concern for possible spasm in the past. She had a repeat echo which shows an improvement in her EF up to 55-60%. Today she tells me her main concern is fatigue. She says she doesn't sleep very well and in fact the Ambien that used to help her to get to sleep is not working. She is tired throughout the day and  occasionally feels palpitations at night. His may be related to sleep apnea. She says that she's been told and is acting noted awaking herself up at night gasping for breath.  PMHx:  Past Medical History  Diagnosis Date  . Cardiomyopathy     resolved  . Chest pain     chronicc  . Hyperlipidemia   . HTN (hypertension)   . Hypothyroidism   . Adrenal insufficiency     diagnosed 2012  . Anxiety   . Nondiabetic gastroparesis   . CAD (coronary artery disease)     Cath 2008 EF normal. RCA 50-60, Septal 50%. Myoview 3/12: EF 53% normal perfusion  . Chronic back pain   . QT prolongation   . Mitral valve prolapse   . Gastroparesis   . Chronic diarrhea   . Astigmatism   . Concussion     sept 28th 2014  . CHF (congestive heart failure)   . Cardiac arrest     2/2 adissonian crisis  . Tobacco abuse     down to 2 cigarettes per day    Past Surgical History  Procedure Laterality Date  . Cholecystectomy    . Spine surgery    . Vesicovaginal fistula closure w/ tah    . Varicose vein surgery    . Abdominal hysterectomy    . Cardiac catheterization  03/2007    showed 60% lesion in the right coronary artery  . Left heart catheterization with coronary angiogram N/A 04/13/2012    Procedure: LEFT HEART CATHETERIZATION WITH CORONARY ANGIOGRAM;  Surgeon: Hillary Bow, MD;  Location: [facility] CATH  LAB;  Service: Cardiovascular;  Laterality: N/A;    FAMHx:  Family History  Problem Relation Age of Onset  . Coronary artery disease    . Heart attack Mother   . Cancer Father     Colon    SOCHx:   reports that she has been smoking Cigarettes.  She has a 5 pack-year smoking history. She has never used smokeless tobacco. She reports that she does not drink alcohol or use illicit drugs.  ALLERGIES:  Allergies  Allergen Reactions  . Bee Venom Anaphylaxis  . Doxycycline Other (See Comments)    Due to Pre-Existing conditions involved with stomach, patient does not take the following medication    . Erythromycin Other (See Comments)    Due to Pre-Existing conditions involved with stomach, patient does not take the following medication  . Ibuprofen Nausea Only    gastroparesis  . Penicillins Anaphylaxis  . Cephalexin Diarrhea  . Dust Mite Extract Hives and Swelling  . Morphine And Related Itching  . Sulfonamide Derivatives Itching  . Tramadol Nausea Only    ROS: A comprehensive review of systems was negative except for: Constitutional: positive for fatigue Respiratory: positive for dyspnea on exertion  HOME MEDS: Current Outpatient Prescriptions  Medication Sig Dispense Refill  . amLODipine (NORVASC) 5 MG tablet Take 1 tablet (5 mg total) by mouth daily. 30 tablet 1  . aspirin 81 MG chewable tablet Chew 81 mg by mouth daily. Daily dose is 1 tab, takes 4 tabs if feeling chest pain or pressure    . cholecalciferol (VITAMIN D) 1000 UNITS tablet Take 1,000 Units by mouth daily.    . cyclobenzaprine (FLEXERIL) 10 MG tablet Take 10 mg by mouth 3 (three) times daily as needed for muscle spasms.     . hydrochlorothiazide (HYDRODIURIL) 25 MG tablet Take 12.5 mg by mouth 2 (two) times daily as needed (Blood pressure rises).     . hydrocortisone (CORTEF) 10 MG tablet Take 10-20 mg by mouth 2 (two) times daily. Pt takes 20 mg in the morning and 10 mg at night    . levothyroxine (SYNTHROID, LEVOTHROID) 75 MCG tablet Take 75 mcg by mouth daily before breakfast.    . LORazepam (ATIVAN) 2 MG tablet Take 2 mg by mouth every 6 (six) hours as needed for anxiety.    . meclizine (ANTIVERT) 25 MG tablet Take 1 tablet (25 mg total) by mouth 4 (four) times daily as needed for dizziness. 60 tablet 0  . metoCLOPramide (REGLAN) 10 MG tablet Take 20 mg by mouth 3 (three) times daily. Runny stools    . nitroGLYCERIN (NITROSTAT) 0.4 MG SL tablet Place 0.4 mg under the tongue every 5 (five) minutes as needed for chest pain.    Marland Kitchen ondansetron (ZOFRAN) 4 MG tablet Take 4-8 mg by mouth every 4 (four) hours as  needed for nausea.     Marland Kitchen oxycodone (ROXICODONE) 30 MG immediate release tablet Take 30 mg by mouth every 6 (six) hours as needed for pain.     . pramipexole (MIRAPEX) 0.125 MG tablet Take 0.125 mg by mouth at bedtime as needed (restles leg syndrome).    Marland Kitchen PROAIR HFA 108 (90 BASE) MCG/ACT inhaler Inhale 2 puffs into the lungs every 6 (six) hours as needed.  11  . zolpidem (AMBIEN) 10 MG tablet Take 10 mg by mouth at bedtime.      No current facility-administered medications for this visit.    LABS/IMAGING: No results found for this or any previous  visit (from the past 48 hour(s)). No results found.  WEIGHTS: Wt Readings from Last 3 Encounters:  06/13/15 195 lb 6.4 oz (88.633 kg)  05/25/15 190 lb (86.183 kg)  05/04/15 194 lb 3.2 oz (88.089 kg)    VITALS: BP 100/72 mmHg  Pulse 84  Ht 5' 5.5" (1.664 m)  Wt 195 lb 6.4 oz (88.633 kg)  BMI 32.01 kg/m2  EXAM: General appearance: alert, no distress and mildly obese Neck: no carotid bruit, no JVD and thyroid not enlarged, symmetric, no tenderness/mass/nodules Lungs: clear to auscultation bilaterally Heart: regular rate and rhythm, S1, S2 normal, no murmur, click, rub or gallop Abdomen: soft, non-tender; bowel sounds normal; no masses,  no organomegaly Extremities: extremities normal, atraumatic, no cyanosis or edema Pulses: 2+ and symmetric Skin: Skin color, texture, turgor normal. No rashes or lesions Neurologic: Grossly normal PSych: Anxious  EKG: Deferred  ASSESSMENT: 1. Atypical musculoskeletal chest pain-improved with muscle relaxants, possibly cervicalgia with prior history of neck surgery 2. Palpitations 3. Fatigue, nonrestorative sleep and witnessed apnea  PLAN: 1.   Mrs. Haye does not need further cardiovascular workup at this time. Her echo shows normalization of LV function. Her symptoms improved with muscle relaxants. There is an element of poor sleep which is not restorative and she's had episodes of gasping  waking up which is concerning for obstructive sleep apnea. I like to send her for a sleep study and hopefully treat her appropriately if she does have obstructive sleep apnea. Plan to see her back in a few months to discuss those findings.  Pixie Casino, MD, [facility] Attending Cardiologist [place] 06/13/2015, 1:08 PM

## 2015-06-13 NOTE — Patient Instructions (Signed)
Your physician has recommended that you have a sleep study. This test records several body functions during sleep, including: brain activity, eye movement, oxygen and carbon dioxide blood levels, heart rate and rhythm, breathing rate and rhythm, the flow of air through your mouth and nose, snoring, body muscle movements, and chest and belly movement.  Dr Debara Pickett recommends that you schedule a follow-up appointment in 3 months.

## 2015-06-27 DIAGNOSIS — E6609 Other obesity due to excess calories: Secondary | ICD-10-CM | POA: Diagnosis not present

## 2015-06-27 DIAGNOSIS — F419 Anxiety disorder, unspecified: Secondary | ICD-10-CM | POA: Diagnosis not present

## 2015-06-27 DIAGNOSIS — I10 Essential (primary) hypertension: Secondary | ICD-10-CM | POA: Diagnosis not present

## 2015-06-27 DIAGNOSIS — Z6830 Body mass index (BMI) 30.0-30.9, adult: Secondary | ICD-10-CM | POA: Diagnosis not present

## 2015-06-27 DIAGNOSIS — G4700 Insomnia, unspecified: Secondary | ICD-10-CM | POA: Diagnosis not present

## 2015-06-27 DIAGNOSIS — R079 Chest pain, unspecified: Secondary | ICD-10-CM | POA: Diagnosis not present

## 2015-06-27 DIAGNOSIS — Z1389 Encounter for screening for other disorder: Secondary | ICD-10-CM | POA: Diagnosis not present

## 2015-06-27 DIAGNOSIS — G894 Chronic pain syndrome: Secondary | ICD-10-CM | POA: Diagnosis not present

## 2015-07-23 ENCOUNTER — Encounter (HOSPITAL_BASED_OUTPATIENT_CLINIC_OR_DEPARTMENT_OTHER): Payer: Medicare Other

## 2015-07-24 ENCOUNTER — Other Ambulatory Visit: Payer: Self-pay | Admitting: Internal Medicine

## 2015-07-25 DIAGNOSIS — I429 Cardiomyopathy, unspecified: Secondary | ICD-10-CM | POA: Diagnosis not present

## 2015-07-25 DIAGNOSIS — M1991 Primary osteoarthritis, unspecified site: Secondary | ICD-10-CM | POA: Diagnosis not present

## 2015-07-25 DIAGNOSIS — Z6830 Body mass index (BMI) 30.0-30.9, adult: Secondary | ICD-10-CM | POA: Diagnosis not present

## 2015-07-25 DIAGNOSIS — E782 Mixed hyperlipidemia: Secondary | ICD-10-CM | POA: Diagnosis not present

## 2015-07-25 DIAGNOSIS — Z23 Encounter for immunization: Secondary | ICD-10-CM | POA: Diagnosis not present

## 2015-07-25 DIAGNOSIS — E063 Autoimmune thyroiditis: Secondary | ICD-10-CM | POA: Diagnosis not present

## 2015-07-25 DIAGNOSIS — Z1389 Encounter for screening for other disorder: Secondary | ICD-10-CM | POA: Diagnosis not present

## 2015-08-12 DIAGNOSIS — R413 Other amnesia: Secondary | ICD-10-CM | POA: Diagnosis not present

## 2015-08-12 DIAGNOSIS — G251 Drug-induced tremor: Secondary | ICD-10-CM | POA: Diagnosis not present

## 2015-08-12 DIAGNOSIS — R2689 Other abnormalities of gait and mobility: Secondary | ICD-10-CM | POA: Diagnosis not present

## 2015-08-12 DIAGNOSIS — I10 Essential (primary) hypertension: Secondary | ICD-10-CM | POA: Diagnosis not present

## 2015-08-12 DIAGNOSIS — G4709 Other insomnia: Secondary | ICD-10-CM | POA: Diagnosis not present

## 2015-08-12 DIAGNOSIS — G5603 Carpal tunnel syndrome, bilateral upper limbs: Secondary | ICD-10-CM | POA: Diagnosis not present

## 2015-08-12 DIAGNOSIS — G2581 Restless legs syndrome: Secondary | ICD-10-CM | POA: Diagnosis not present

## 2015-08-18 ENCOUNTER — Telehealth: Payer: Self-pay | Admitting: Internal Medicine

## 2015-08-18 DIAGNOSIS — Z79899 Other long term (current) drug therapy: Secondary | ICD-10-CM | POA: Diagnosis not present

## 2015-08-18 DIAGNOSIS — M818 Other osteoporosis without current pathological fracture: Secondary | ICD-10-CM | POA: Diagnosis not present

## 2015-08-18 DIAGNOSIS — R5383 Other fatigue: Secondary | ICD-10-CM | POA: Diagnosis not present

## 2015-08-18 DIAGNOSIS — E538 Deficiency of other specified B group vitamins: Secondary | ICD-10-CM | POA: Diagnosis not present

## 2015-08-18 DIAGNOSIS — E559 Vitamin D deficiency, unspecified: Secondary | ICD-10-CM | POA: Diagnosis not present

## 2015-08-18 NOTE — Telephone Encounter (Signed)
Received records from [facility] for appointment on 09/15/15 with Dr Debara Pickett.  Records given to [facility] (medical records) for Dr Lysbeth Penner schedule on 09/15/15. lp

## 2015-08-26 DIAGNOSIS — I209 Angina pectoris, unspecified: Secondary | ICD-10-CM | POA: Diagnosis not present

## 2015-08-26 DIAGNOSIS — I10 Essential (primary) hypertension: Secondary | ICD-10-CM | POA: Diagnosis not present

## 2015-08-26 DIAGNOSIS — Z8249 Family history of ischemic heart disease and other diseases of the circulatory system: Secondary | ICD-10-CM | POA: Insufficient documentation

## 2015-08-26 DIAGNOSIS — R079 Chest pain, unspecified: Secondary | ICD-10-CM | POA: Diagnosis not present

## 2015-08-26 DIAGNOSIS — F17200 Nicotine dependence, unspecified, uncomplicated: Secondary | ICD-10-CM | POA: Insufficient documentation

## 2015-08-26 DIAGNOSIS — R0602 Shortness of breath: Secondary | ICD-10-CM | POA: Diagnosis not present

## 2015-08-27 DIAGNOSIS — E039 Hypothyroidism, unspecified: Secondary | ICD-10-CM | POA: Diagnosis not present

## 2015-08-27 DIAGNOSIS — I10 Essential (primary) hypertension: Secondary | ICD-10-CM | POA: Diagnosis not present

## 2015-08-27 DIAGNOSIS — I25119 Atherosclerotic heart disease of native coronary artery with unspecified angina pectoris: Secondary | ICD-10-CM | POA: Diagnosis not present

## 2015-08-27 DIAGNOSIS — F17210 Nicotine dependence, cigarettes, uncomplicated: Secondary | ICD-10-CM | POA: Diagnosis not present

## 2015-08-27 DIAGNOSIS — R079 Chest pain, unspecified: Secondary | ICD-10-CM | POA: Diagnosis not present

## 2015-08-27 DIAGNOSIS — Z79899 Other long term (current) drug therapy: Secondary | ICD-10-CM | POA: Diagnosis not present

## 2015-09-01 DIAGNOSIS — E079 Disorder of thyroid, unspecified: Secondary | ICD-10-CM | POA: Diagnosis not present

## 2015-09-01 DIAGNOSIS — Z87892 Personal history of anaphylaxis: Secondary | ICD-10-CM | POA: Diagnosis not present

## 2015-09-01 DIAGNOSIS — Z79899 Other long term (current) drug therapy: Secondary | ICD-10-CM | POA: Diagnosis not present

## 2015-09-01 DIAGNOSIS — G8918 Other acute postprocedural pain: Secondary | ICD-10-CM | POA: Diagnosis not present

## 2015-09-01 DIAGNOSIS — I10 Essential (primary) hypertension: Secondary | ICD-10-CM | POA: Diagnosis not present

## 2015-09-01 DIAGNOSIS — Z886 Allergy status to analgesic agent status: Secondary | ICD-10-CM | POA: Diagnosis not present

## 2015-09-01 DIAGNOSIS — M1990 Unspecified osteoarthritis, unspecified site: Secondary | ICD-10-CM | POA: Diagnosis not present

## 2015-09-01 DIAGNOSIS — M25531 Pain in right wrist: Secondary | ICD-10-CM | POA: Diagnosis not present

## 2015-09-01 DIAGNOSIS — Z881 Allergy status to other antibiotic agents status: Secondary | ICD-10-CM | POA: Diagnosis not present

## 2015-09-01 DIAGNOSIS — Z91030 Bee allergy status: Secondary | ICD-10-CM | POA: Diagnosis not present

## 2015-09-01 DIAGNOSIS — F17200 Nicotine dependence, unspecified, uncomplicated: Secondary | ICD-10-CM | POA: Diagnosis not present

## 2015-09-01 DIAGNOSIS — Z880 Allergy status to penicillin: Secondary | ICD-10-CM | POA: Diagnosis not present

## 2015-09-01 DIAGNOSIS — Y840 Cardiac catheterization as the cause of abnormal reaction of the patient, or of later complication, without mention of misadventure at the time of the procedure: Secondary | ICD-10-CM | POA: Diagnosis not present

## 2015-09-01 DIAGNOSIS — Z885 Allergy status to narcotic agent status: Secondary | ICD-10-CM | POA: Diagnosis not present

## 2015-09-01 DIAGNOSIS — Z7982 Long term (current) use of aspirin: Secondary | ICD-10-CM | POA: Diagnosis not present

## 2015-09-05 ENCOUNTER — Other Ambulatory Visit (HOSPITAL_COMMUNITY): Payer: Medicare Other

## 2015-09-05 ENCOUNTER — Emergency Department (HOSPITAL_COMMUNITY)
Admission: EM | Admit: 2015-09-05 | Discharge: 2015-09-05 | Disposition: A | Discharge status: Home / Self Care | Payer: Medicare Other | Attending: Emergency Medicine | Admitting: Emergency Medicine

## 2015-09-05 ENCOUNTER — Encounter (HOSPITAL_COMMUNITY): Payer: Self-pay | Admitting: Emergency Medicine

## 2015-09-05 ENCOUNTER — Emergency Department (HOSPITAL_COMMUNITY): Payer: Medicare Other

## 2015-09-05 DIAGNOSIS — Z79899 Other long term (current) drug therapy: Secondary | ICD-10-CM | POA: Insufficient documentation

## 2015-09-05 DIAGNOSIS — Z9889 Other specified postprocedural states: Secondary | ICD-10-CM | POA: Insufficient documentation

## 2015-09-05 DIAGNOSIS — I341 Nonrheumatic mitral (valve) prolapse: Secondary | ICD-10-CM | POA: Diagnosis not present

## 2015-09-05 DIAGNOSIS — R52 Pain, unspecified: Secondary | ICD-10-CM

## 2015-09-05 DIAGNOSIS — I742 Embolism and thrombosis of arteries of the upper extremities: Secondary | ICD-10-CM | POA: Diagnosis not present

## 2015-09-05 DIAGNOSIS — Z880 Allergy status to penicillin: Secondary | ICD-10-CM | POA: Insufficient documentation

## 2015-09-05 DIAGNOSIS — G8929 Other chronic pain: Secondary | ICD-10-CM | POA: Insufficient documentation

## 2015-09-05 DIAGNOSIS — I10 Essential (primary) hypertension: Secondary | ICD-10-CM | POA: Diagnosis not present

## 2015-09-05 DIAGNOSIS — R609 Edema, unspecified: Secondary | ICD-10-CM

## 2015-09-05 DIAGNOSIS — K3184 Gastroparesis: Secondary | ICD-10-CM | POA: Diagnosis not present

## 2015-09-05 DIAGNOSIS — I509 Heart failure, unspecified: Secondary | ICD-10-CM | POA: Diagnosis not present

## 2015-09-05 DIAGNOSIS — Z7982 Long term (current) use of aspirin: Secondary | ICD-10-CM | POA: Insufficient documentation

## 2015-09-05 DIAGNOSIS — F17210 Nicotine dependence, cigarettes, uncomplicated: Secondary | ICD-10-CM | POA: Diagnosis not present

## 2015-09-05 DIAGNOSIS — F419 Anxiety disorder, unspecified: Secondary | ICD-10-CM | POA: Diagnosis not present

## 2015-09-05 DIAGNOSIS — E039 Hypothyroidism, unspecified: Secondary | ICD-10-CM | POA: Insufficient documentation

## 2015-09-05 DIAGNOSIS — I749 Embolism and thrombosis of unspecified artery: Secondary | ICD-10-CM | POA: Diagnosis not present

## 2015-09-05 DIAGNOSIS — I429 Cardiomyopathy, unspecified: Secondary | ICD-10-CM | POA: Diagnosis not present

## 2015-09-05 DIAGNOSIS — I2510 Atherosclerotic heart disease of native coronary artery without angina pectoris: Secondary | ICD-10-CM | POA: Diagnosis not present

## 2015-09-05 DIAGNOSIS — M79601 Pain in right arm: Secondary | ICD-10-CM | POA: Diagnosis not present

## 2015-09-05 DIAGNOSIS — R2231 Localized swelling, mass and lump, right upper limb: Secondary | ICD-10-CM | POA: Diagnosis present

## 2015-09-05 DIAGNOSIS — I4581 Long QT syndrome: Secondary | ICD-10-CM | POA: Insufficient documentation

## 2015-09-05 LAB — CBC WITH DIFFERENTIAL/PLATELET
BASOS ABS: 0.1 10*3/uL (ref 0.0–0.1)
BASOS PCT: 1 %
EOS ABS: 0.3 10*3/uL (ref 0.0–0.7)
Eosinophils Relative: 3 %
HCT: 40.5 % (ref 36.0–46.0)
Hemoglobin: 13.6 g/dL (ref 12.0–15.0)
LYMPHS ABS: 4.3 10*3/uL — AB (ref 0.7–4.0)
Lymphocytes Relative: 34 %
MCH: 28.9 pg (ref 26.0–34.0)
MCHC: 33.6 g/dL (ref 30.0–36.0)
MCV: 86 fL (ref 78.0–100.0)
Monocytes Absolute: 0.8 10*3/uL (ref 0.1–1.0)
Monocytes Relative: 6 %
NEUTROS PCT: 56 %
Neutro Abs: 6.9 10*3/uL (ref 1.7–7.7)
PLATELETS: 438 10*3/uL — AB (ref 150–400)
RBC: 4.71 MIL/uL (ref 3.87–5.11)
RDW: 14.7 % (ref 11.5–15.5)
WBC: 12.4 10*3/uL — ABNORMAL HIGH (ref 4.0–10.5)

## 2015-09-05 LAB — BASIC METABOLIC PANEL
Anion gap: 8 (ref 5–15)
BUN: 8 mg/dL (ref 6–20)
CHLORIDE: 101 mmol/L (ref 101–111)
CO2: 32 mmol/L (ref 22–32)
CREATININE: 0.86 mg/dL (ref 0.44–1.00)
Calcium: 8.7 mg/dL — ABNORMAL LOW (ref 8.9–10.3)
GFR calc non Af Amer: >60 mL/min (ref >60)
GLUCOSE: 90 mg/dL (ref 65–99)
Potassium: 3.3 mmol/L — ABNORMAL LOW (ref 3.5–5.1)
Sodium: 141 mmol/L (ref 135–145)

## 2015-09-05 MED ORDER — HYDROMORPHONE HCL 1 MG/ML IJ SOLN
1.0000 mg | Freq: Once | INTRAMUSCULAR | Status: AC
  Administered 2015-09-05: 1 mg via INTRAVENOUS
  Filled 2015-09-05: qty 1

## 2015-09-05 NOTE — ED Notes (Addendum)
Pt c/o right forearm pain since cardiac craterization 08/27/15 at [facility]. Pt states she was seen 09/01/15 at [facility] for same and started on Clindamycin 500mg  TID po. Pt c/o continued pain. No edema/erythema noted. nad noted.

## 2015-09-05 NOTE — ED Notes (Signed)
Pt reports right arm swelling,pain and tingling sensation since dec 26th. Pt reports had heart catherization through right radial on dec 21st.cap refill delayed, faint radial pulse. Limited rom noted.

## 2015-09-05 NOTE — Discharge Instructions (Signed)
Elevate arm. Follow-up with cardiologist who did your procedure. Recommend cold pack to arm.

## 2015-09-05 NOTE — ED Provider Notes (Signed)
History  By signing my name below, I, Marlowe Kays, attest that this documentation has been prepared under the direction and in the presence of Nat Christen, MD. Electronically Signed: Marlowe Kays, ED Scribe. 09/05/2015. 10:54 AM.  Chief Complaint  Patient presents with  . Arm Swelling   The history is provided by the patient and medical records. No language interpreter was used.    HPI Comments:  [name] is a 49 y.o. female s/p heart catheterization through the right radial wrist 9 days ago on 08/27/15 who presents to the Emergency Department complaining of severe right wrist pain that began about four days ago. She states she heard a popping noise at the end of the procedure and states she has been experiencing pain since. She reports associated numbness and tingling of the right hand and states the pain extends up towards the right elbow. Pt reports fever Tmax 101.2 degrees yesterday and reports CP PTA today. She was rechecked four days ago when the pain began and was prescribed Clindamycin and told her to follow up with her PCP at [facility]. She has taken Tylenol for pain and fever with minimal relief of the pain. Touching the area or moving the wrist intensifies the pain. She denies alleviating factors. She denies right axilla pain, nausea or vomiting.   Past Medical History  Diagnosis Date  . Cardiomyopathy     resolved  . Chest pain     chronicc  . Hyperlipidemia   . HTN (hypertension)   . Hypothyroidism   . Adrenal insufficiency ([place])     diagnosed 2012  . Anxiety   . Nondiabetic gastroparesis   . CAD (coronary artery disease)     Cath 2008 EF normal. RCA 50-60, Septal 50%. Myoview 3/12: EF 53% normal perfusion  . Chronic back pain   . QT prolongation   . Mitral valve prolapse   . Gastroparesis   . Chronic diarrhea   . Astigmatism   . Concussion     sept 28th 2014  . CHF (congestive heart failure) ([place])   . Cardiac arrest ([place])     2/2 adissonian crisis  .  Tobacco abuse     down to 2 cigarettes per day   Past Surgical History  Procedure Laterality Date  . Cholecystectomy    . Spine surgery    . Vesicovaginal fistula closure w/ tah    . Varicose vein surgery    . Abdominal hysterectomy    . Cardiac catheterization  03/2007    showed 60% lesion in the right coronary artery  . Left heart catheterization with coronary angiogram N/A 04/13/2012    Procedure: LEFT HEART CATHETERIZATION WITH CORONARY ANGIOGRAM;  Surgeon: Hillary Bow, MD;  Location: [facility] CATH LAB;  Service: Cardiovascular;  Laterality: N/A;   Family History  Problem Relation Age of Onset  . Coronary artery disease    . Heart attack Mother   . Cancer Father     Colon   Social History  Substance Use Topics  . Smoking status: Current Every Day Smoker -- 0.50 packs/day for 10 years    Types: Cigarettes  . Smokeless tobacco: Never Used     Comment: smokes 6-7 cigarettes per day  . Alcohol Use: No   OB History    Gravida Para Term Preterm AB TAB SAB Ectopic Multiple Living   3 2 2  0 1  1   2      Review of Systems A complete 10 system review of systems  was obtained and all systems are negative except as noted in the HPI and PMH.   Allergies  Bee venom; Doxycycline; Erythromycin; Ibuprofen; Penicillins; Cephalexin; Dust mite extract; Morphine and related; Sulfonamide derivatives; and Tramadol  Home Medications   Prior to Admission medications   Medication Sig Start Date End Date Taking? Authorizing Provider  amLODipine (NORVASC) 5 MG tablet TAKE 1 TABLET (5 MG TOTAL) BY MOUTH DAILY. 07/25/15  Yes Pixie Casino, MD  aspirin 81 MG chewable tablet Chew 81 mg by mouth daily.    Yes Historical Provider, MD  cholecalciferol (VITAMIN D) 1000 UNITS tablet Take 1,000 Units by mouth daily.   Yes Historical Provider, MD  cyclobenzaprine (FLEXERIL) 10 MG tablet Take 10 mg by mouth 3 (three) times daily as needed for muscle spasms.    Yes Historical Provider, MD   hydrochlorothiazide (HYDRODIURIL) 25 MG tablet Take 12.5 mg by mouth 2 (two) times daily as needed (Blood pressure rises).    Yes Historical Provider, MD  hydrocortisone (CORTEF) 10 MG tablet Take 10-20 mg by mouth 2 (two) times daily. Pt takes 20 mg in the morning and 10 mg at night   Yes Historical Provider, MD  levothyroxine (SYNTHROID, LEVOTHROID) 75 MCG tablet Take 75 mcg by mouth daily before breakfast.   Yes Historical Provider, MD  LORazepam (ATIVAN) 2 MG tablet Take 2 mg by mouth every 6 (six) hours as needed for anxiety.   Yes Historical Provider, MD  meclizine (ANTIVERT) 25 MG tablet Take 1 tablet (25 mg total) by mouth 4 (four) times daily as needed for dizziness. 06/15/13  Yes Rolland Porter, MD  metoCLOPramide (REGLAN) 10 MG tablet Take 10 mg by mouth 3 (three) times daily. Runny stools   Yes Historical Provider, MD  nitroGLYCERIN (NITROSTAT) 0.4 MG SL tablet Place 0.4 mg under the tongue every 5 (five) minutes as needed for chest pain.   Yes Historical Provider, MD  oxycodone (ROXICODONE) 30 MG immediate release tablet Take 30 mg by mouth every 6 (six) hours as needed for pain.    Yes Historical Provider, MD  pramipexole (MIRAPEX) 0.125 MG tablet Take 0.125 mg by mouth at bedtime as needed (restles leg syndrome).   Yes Historical Provider, MD  zolpidem (AMBIEN) 10 MG tablet Take 10 mg by mouth at bedtime.  03/20/14  Yes Historical Provider, MD  ondansetron (ZOFRAN) 4 MG tablet Take 4-8 mg by mouth every 4 (four) hours as needed for nausea.     Historical Provider, MD   Triage Vitals: BP 148/87 mmHg  Pulse 94  Temp(Src) 98 F (36.7 C) (Oral)  Resp 18  Ht 5\' 7"  (1.702 m)  Wt 190 lb (86.183 kg)  BMI 29.75 kg/m2  SpO2 99% Physical Exam  Constitutional: She is oriented to person, place, and time. She appears well-developed and well-nourished.  HENT:  Head: Normocephalic and atraumatic.  Eyes: Conjunctivae and EOM are normal. Pupils are equal, round, and reactive to light.  Neck:  Normal range of motion. Neck supple.  Cardiovascular: Normal rate and regular rhythm.   Pulmonary/Chest: Effort normal and breath sounds normal.  Abdominal: Soft. Bowel sounds are normal.  Musculoskeletal: Normal range of motion.  Tender from anterior radial aspect of wrist at insertion site of catheter to proximal aspect of elbow. Generalized right hand tenderness. No erythema. No axillary adenopathy.  Neurological: She is alert and oriented to person, place, and time.  Skin: Skin is warm and dry.  Psychiatric: She has a normal mood and affect. Her behavior  is normal.  Nursing note and vitals reviewed.   ED Course  Procedures (including critical care time) DIAGNOSTIC STUDIES: Oxygen Saturation is 99% on RA, normal by my interpretation.   COORDINATION OF CARE: 8:50 AM- Will order doppler study of right wrist. Pt verbalizes understanding and agrees to plan.  Medications  HYDROmorphone (DILAUDID) injection 1 mg (1 mg Intravenous Given 09/05/15 1034)  HYDROmorphone (DILAUDID) injection 1 mg (1 mg Intravenous Given 09/05/15 1336)    Labs Review Labs Reviewed  BASIC METABOLIC PANEL - Abnormal; Notable for the following:    Potassium 3.3 (*)    Calcium 8.7 (*)    All other components within normal limits  CBC WITH DIFFERENTIAL/PLATELET - Abnormal; Notable for the following:    WBC 12.4 (*)    Platelets 438 (*)    Lymphs Abs 4.3 (*)    All other components within normal limits    Imaging Review [place] Upper Ext Art Right Ltd  09/05/2015  CLINICAL DATA:  Right arm pain following right radial cardiac catheterization EXAM: Right UPPER EXTREMITY ARTERIAL DUPLEX SCAN TECHNIQUE: Gray-scale sonography as well as color Doppler and duplex ultrasound was performed to evaluate the arteries of the upper extremity. COMPARISON:  None. FINDINGS: There is thrombosis of the radial artery from its origin extending to the level of the right wrist. The adjacent venous structures are within normal limits.  No focal hematoma is noted. IMPRESSION: Thrombosis of the radial artery throughout its course in the right forearm. These results will be called to the ordering clinician or representative by the Radiologist Assistant, and communication documented in the PACS or zVision Dashboard. Electronically Signed   By: Inez Catalina M.D.   On: 09/05/2015 10:09   I have personally reviewed and evaluated these images and lab results as part of my medical decision-making.   EKG Interpretation None      MDM   Final diagnoses:  Right radial artery thrombus [facility])    Patient has good blood flow to the right hand. Ultrasound reveals a thrombus of the right radial artery in the right forearm. This was discussed with the vascular surgeon on call. He stated no acute interventions were necessary. Patient is already on large doses of pain medicine for chronic back pain.  I personally performed the services described in this documentation, which was scribed in my presence. The recorded information has been reviewed and is accurate.      Nat Christen, MD 09/05/15 [id number]

## 2015-09-12 DIAGNOSIS — S55101A Unspecified injury of radial artery at forearm level, right arm, initial encounter: Secondary | ICD-10-CM | POA: Diagnosis not present

## 2015-09-12 DIAGNOSIS — R079 Chest pain, unspecified: Secondary | ICD-10-CM | POA: Diagnosis not present

## 2015-09-12 DIAGNOSIS — R0602 Shortness of breath: Secondary | ICD-10-CM | POA: Diagnosis not present

## 2015-09-12 DIAGNOSIS — Z720 Tobacco use: Secondary | ICD-10-CM | POA: Diagnosis not present

## 2015-09-15 ENCOUNTER — Ambulatory Visit: Payer: Medicare Other | Admitting: Internal Medicine

## 2015-09-16 ENCOUNTER — Emergency Department (HOSPITAL_COMMUNITY)
Admission: EM | Admit: 2015-09-16 | Discharge: 2015-09-16 | Disposition: A | Discharge status: Home / Self Care | Payer: Medicare Other | Attending: Emergency Medicine | Admitting: Emergency Medicine

## 2015-09-16 ENCOUNTER — Encounter (HOSPITAL_COMMUNITY): Payer: Self-pay | Admitting: Emergency Medicine

## 2015-09-16 DIAGNOSIS — I509 Heart failure, unspecified: Secondary | ICD-10-CM | POA: Diagnosis not present

## 2015-09-16 DIAGNOSIS — E039 Hypothyroidism, unspecified: Secondary | ICD-10-CM | POA: Insufficient documentation

## 2015-09-16 DIAGNOSIS — Z8719 Personal history of other diseases of the digestive system: Secondary | ICD-10-CM | POA: Diagnosis not present

## 2015-09-16 DIAGNOSIS — Z8674 Personal history of sudden cardiac arrest: Secondary | ICD-10-CM | POA: Insufficient documentation

## 2015-09-16 DIAGNOSIS — F419 Anxiety disorder, unspecified: Secondary | ICD-10-CM | POA: Insufficient documentation

## 2015-09-16 DIAGNOSIS — Z79899 Other long term (current) drug therapy: Secondary | ICD-10-CM | POA: Insufficient documentation

## 2015-09-16 DIAGNOSIS — Z7982 Long term (current) use of aspirin: Secondary | ICD-10-CM | POA: Insufficient documentation

## 2015-09-16 DIAGNOSIS — I742 Embolism and thrombosis of arteries of the upper extremities: Secondary | ICD-10-CM | POA: Diagnosis not present

## 2015-09-16 DIAGNOSIS — F17210 Nicotine dependence, cigarettes, uncomplicated: Secondary | ICD-10-CM | POA: Insufficient documentation

## 2015-09-16 DIAGNOSIS — Z880 Allergy status to penicillin: Secondary | ICD-10-CM | POA: Insufficient documentation

## 2015-09-16 DIAGNOSIS — I748 Embolism and thrombosis of other arteries: Secondary | ICD-10-CM | POA: Diagnosis not present

## 2015-09-16 DIAGNOSIS — I2510 Atherosclerotic heart disease of native coronary artery without angina pectoris: Secondary | ICD-10-CM | POA: Diagnosis not present

## 2015-09-16 DIAGNOSIS — I10 Essential (primary) hypertension: Secondary | ICD-10-CM | POA: Insufficient documentation

## 2015-09-16 DIAGNOSIS — M25531 Pain in right wrist: Secondary | ICD-10-CM

## 2015-09-16 DIAGNOSIS — Z87828 Personal history of other (healed) physical injury and trauma: Secondary | ICD-10-CM | POA: Insufficient documentation

## 2015-09-16 DIAGNOSIS — G8929 Other chronic pain: Secondary | ICD-10-CM | POA: Diagnosis not present

## 2015-09-16 MED ORDER — ONDANSETRON HCL 4 MG/2ML IJ SOLN
4.0000 mg | Freq: Once | INTRAMUSCULAR | Status: AC
  Administered 2015-09-16: 4 mg via INTRAVENOUS
  Filled 2015-09-16: qty 2

## 2015-09-16 MED ORDER — FENTANYL CITRATE (PF) 100 MCG/2ML IJ SOLN
50.0000 ug | Freq: Once | INTRAMUSCULAR | Status: AC
  Administered 2015-09-16: 50 ug via INTRAVENOUS
  Filled 2015-09-16: qty 2

## 2015-09-16 NOTE — ED Notes (Signed)
Pt to ER with complaint of right arm and hand pain. Pt recently seen for this pain and was told she had a blood clot in her right arm after having a heart cath completed in December 2016. Pt was told to follow up with doctor but if she began having increase in pain to come to the ER "because my hand will turn gangrene and have to be amputated." Pt is a/o x4. VSS

## 2015-09-16 NOTE — ED Provider Notes (Signed)
CSN: [id number]     Arrival date & time 09/16/15  [id number] History   First MD Initiated Contact with Patient 09/16/15 [phone]     Chief Complaint  Patient presents with  . Arm Pain    BLOOD CLOT    HPI  [name] is an 50 y.o. female with history of HTN, HLD, CAD, CHF who presents to the ED for re-eval of right arm, wrist, and hand pain. She was seen in the ED on 12/30 with right forearm pain after a heart cath procedure, found to have R radial thrombosis on [place]. Instructions were given to take home pain meds as necessary and f/u as outpatient. Pt saw cardiology [facility]) as an outpatient a few days ago and it was deemed unnecessary/not beneficial to start anticoagulation at this time. She was apparently referred to [facility] with an appointment scheduled for 1/23 per EMR review. Pt states she was not informed of this appointment. She is in the ED today as she started experiencing new right wrist pain along the radial edge of her wrist last night. She states that the pain shoots up into her fingers. Denies new numbness or tingling. She states she feels like perhaps she has some faint new bruising to her right dorsum of her hand. Denies new injury or trauma. States that her forearm pain has improved from prior but is still bothersome.    Past Medical History  Diagnosis Date  . Cardiomyopathy     resolved  . Chest pain     chronicc  . Hyperlipidemia   . HTN (hypertension)   . Hypothyroidism   . Adrenal insufficiency ([place])     diagnosed 2012  . Anxiety   . Nondiabetic gastroparesis   . CAD (coronary artery disease)     Cath 2008 EF normal. RCA 50-60, Septal 50%. Myoview 3/12: EF 53% normal perfusion  . Chronic back pain   . QT prolongation   . Mitral valve prolapse   . Gastroparesis   . Chronic diarrhea   . Astigmatism   . Concussion     sept 28th 2014  . CHF (congestive heart failure) ([place])   . Cardiac arrest ([place])     2/2 adissonian crisis  . Tobacco abuse     down to 2 cigarettes  per day   Past Surgical History  Procedure Laterality Date  . Cholecystectomy    . Spine surgery    . Vesicovaginal fistula closure w/ tah    . Varicose vein surgery    . Abdominal hysterectomy    . Cardiac catheterization  03/2007    showed 60% lesion in the right coronary artery  . Left heart catheterization with coronary angiogram N/A 04/13/2012    Procedure: LEFT HEART CATHETERIZATION WITH CORONARY ANGIOGRAM;  Surgeon: Hillary Bow, MD;  Location: [facility] CATH LAB;  Service: Cardiovascular;  Laterality: N/A;   Family History  Problem Relation Age of Onset  . Coronary artery disease    . Heart attack Mother   . Cancer Father     Colon   Social History  Substance Use Topics  . Smoking status: Current Every Day Smoker -- 0.50 packs/day for 10 years    Types: Cigarettes  . Smokeless tobacco: Never Used     Comment: smokes 6-7 cigarettes per day  . Alcohol Use: No   OB History    Gravida Para Term Preterm AB TAB SAB Ectopic Multiple Living   3 2 2  0 1  1  2     Review of Systems  All other systems reviewed and are negative.     Allergies  Bee venom; Doxycycline; Erythromycin; Ibuprofen; Penicillins; Cephalexin; Dust mite extract; Other; Morphine and related; Sulfonamide derivatives; and Tramadol  Home Medications   Prior to Admission medications   Medication Sig Start Date End Date Taking? Authorizing Provider  amLODipine (NORVASC) 5 MG tablet TAKE 1 TABLET (5 MG TOTAL) BY MOUTH DAILY. 07/25/15  Yes Pixie Casino, MD  aspirin 81 MG chewable tablet Chew 81 mg by mouth daily.    Yes Historical Provider, MD  cholecalciferol (VITAMIN D) 1000 UNITS tablet Take 1,000 Units by mouth daily.   Yes Historical Provider, MD  cyclobenzaprine (FLEXERIL) 10 MG tablet Take 10 mg by mouth 3 (three) times daily as needed for muscle spasms.    Yes Historical Provider, MD  hydrochlorothiazide (HYDRODIURIL) 25 MG tablet Take 12.5 mg by mouth 2 (two) times daily as needed (Blood pressure  rises).    Yes Historical Provider, MD  hydrocortisone (CORTEF) 10 MG tablet Take 10-20 mg by mouth 2 (two) times daily. Pt takes 20 mg in the morning and 10 mg at night   Yes Historical Provider, MD  levothyroxine (SYNTHROID, LEVOTHROID) 75 MCG tablet Take 75 mcg by mouth daily before breakfast.   Yes Historical Provider, MD  LORazepam (ATIVAN) 2 MG tablet Take 2 mg by mouth every 6 (six) hours as needed for anxiety.   Yes Historical Provider, MD  meclizine (ANTIVERT) 25 MG tablet Take 1 tablet (25 mg total) by mouth 4 (four) times daily as needed for dizziness. 06/15/13  Yes Rolland Porter, MD  metoCLOPramide (REGLAN) 10 MG tablet Take 10 mg by mouth 3 (three) times daily. Runny stools   Yes Historical Provider, MD  ondansetron (ZOFRAN) 4 MG tablet Take 4-8 mg by mouth every 4 (four) hours as needed for nausea.    Yes Historical Provider, MD  oxycodone (ROXICODONE) 30 MG immediate release tablet Take 30 mg by mouth every 6 (six) hours as needed for pain.    Yes Historical Provider, MD  pramipexole (MIRAPEX) 0.125 MG tablet Take 0.125 mg by mouth at bedtime as needed (restles leg syndrome).   Yes Historical Provider, MD  zolpidem (AMBIEN) 10 MG tablet Take 10 mg by mouth at bedtime.  03/20/14  Yes Historical Provider, MD  nitroGLYCERIN (NITROSTAT) 0.4 MG SL tablet Place 0.4 mg under the tongue every 5 (five) minutes as needed for chest pain.    Historical Provider, MD   BP 137/90 mmHg  Pulse 91  Temp(Src) 97.8 F (36.6 C) (Oral)  Resp 17  Ht 5' 5.5" (1.664 m)  Wt 84.823 kg  BMI 30.63 kg/m2  SpO2 100% Physical Exam  Constitutional: She is oriented to person, place, and time.  HENT:  Right Ear: External ear normal.  Left Ear: External ear normal.  Nose: Nose normal.  Mouth/Throat: Oropharynx is clear and moist. No oropharyngeal exudate.  Eyes: Conjunctivae and EOM are normal. Pupils are equal, round, and reactive to light.  Neck: Normal range of motion. Neck supple.  Cardiovascular: Normal  rate, regular rhythm and normal heart sounds.   Right radial pulse not palpable but was found on Doppler. Right ulnar pulse 2+. Brisk cap refill in all digits. No discoloration of hand.   Pulmonary/Chest: Effort normal and breath sounds normal. No respiratory distress. She has no wheezes. She exhibits no tenderness.  Abdominal: Soft. Bowel sounds are normal. She exhibits no distension. There is no  tenderness. There is no rebound and no guarding.  Musculoskeletal: She exhibits no edema.  Right wrist diffusely ttp particularly along radial aspect. FROM. Strength and senstation intact proximally and distally.  Neurological: She is alert and oriented to person, place, and time. No cranial nerve deficit.  Skin: Skin is warm and dry.  Psychiatric: She has a normal mood and affect.  Nursing note and vitals reviewed.   ED Course  Procedures (including critical care time) Labs Review Labs Reviewed - No data to display  Imaging Review No results found. I have personally reviewed and evaluated these images and lab results as part of my medical decision-making.   EKG Interpretation None      MDM   Final diagnoses:  Radial artery thrombosis, right (HCC)  Right wrist pain    Pt reports improvement in pain with pain meds. I spoke to Dr. Doren Custard, on-call for [facility] at [facility]. At this time pt has good perfusion of right hand, with no discoloration. He believes--and I agree--that at this point there is not much to do but monitor and control pain, follow up with vascular outpatient. I called and spoke to [facility] and moved pt's appointment up to this coming Monday 09/22/15 at 8:30 AM. Pt instructed to continue her home pain meds as needed. ER return precautions given.     Anne Ng, PA-C 09/16/15 [id number]  Malvin Johns, MD 09/16/15 [phone]

## 2015-09-16 NOTE — Discharge Instructions (Signed)
You were seen in the emergency room today for evaluation of right arm, wrist, and hand pain. Your symptoms are likely due to the blood clot that was diagnosed at the end of December. I spoke to Dr. Doren Custard, the vascular surgeon on-call at [facility]. We agree that at this point you have good blood flow to your right hand and are stable to follow-up as an outpatient. Your cardiologist had referred you to [facility] in [facility]. They originally had you scheduled for later this month but I called their office today and had your appointment moved up to Monday, 09/22/15 at 8:30 AM. Please call them to confirm this appointment. Please follow-up with your primary care provider as well. Return to the ER for new or worsening symptoms.

## 2015-09-19 ENCOUNTER — Emergency Department (HOSPITAL_COMMUNITY)
Admission: EM | Admit: 2015-09-19 | Discharge: 2015-09-19 | Disposition: A | Discharge status: Home / Self Care | Payer: Medicare Other | Attending: Emergency Medicine | Admitting: Emergency Medicine

## 2015-09-19 ENCOUNTER — Encounter (HOSPITAL_COMMUNITY): Payer: Self-pay | Admitting: Emergency Medicine

## 2015-09-19 ENCOUNTER — Emergency Department (HOSPITAL_COMMUNITY): Payer: Medicare Other

## 2015-09-19 DIAGNOSIS — G8929 Other chronic pain: Secondary | ICD-10-CM | POA: Diagnosis not present

## 2015-09-19 DIAGNOSIS — I10 Essential (primary) hypertension: Secondary | ICD-10-CM | POA: Insufficient documentation

## 2015-09-19 DIAGNOSIS — E039 Hypothyroidism, unspecified: Secondary | ICD-10-CM | POA: Diagnosis not present

## 2015-09-19 DIAGNOSIS — I509 Heart failure, unspecified: Secondary | ICD-10-CM | POA: Insufficient documentation

## 2015-09-19 DIAGNOSIS — I2510 Atherosclerotic heart disease of native coronary artery without angina pectoris: Secondary | ICD-10-CM | POA: Diagnosis not present

## 2015-09-19 DIAGNOSIS — K3184 Gastroparesis: Secondary | ICD-10-CM | POA: Diagnosis not present

## 2015-09-19 DIAGNOSIS — Z880 Allergy status to penicillin: Secondary | ICD-10-CM | POA: Insufficient documentation

## 2015-09-19 DIAGNOSIS — F17210 Nicotine dependence, cigarettes, uncomplicated: Secondary | ICD-10-CM | POA: Diagnosis not present

## 2015-09-19 DIAGNOSIS — Z7982 Long term (current) use of aspirin: Secondary | ICD-10-CM | POA: Insufficient documentation

## 2015-09-19 DIAGNOSIS — K760 Fatty (change of) liver, not elsewhere classified: Secondary | ICD-10-CM | POA: Diagnosis not present

## 2015-09-19 DIAGNOSIS — Z79899 Other long term (current) drug therapy: Secondary | ICD-10-CM | POA: Insufficient documentation

## 2015-09-19 DIAGNOSIS — F419 Anxiety disorder, unspecified: Secondary | ICD-10-CM | POA: Diagnosis not present

## 2015-09-19 DIAGNOSIS — R1013 Epigastric pain: Secondary | ICD-10-CM | POA: Diagnosis not present

## 2015-09-19 DIAGNOSIS — K838 Other specified diseases of biliary tract: Secondary | ICD-10-CM | POA: Diagnosis not present

## 2015-09-19 DIAGNOSIS — R112 Nausea with vomiting, unspecified: Secondary | ICD-10-CM | POA: Diagnosis present

## 2015-09-19 LAB — URINALYSIS, ROUTINE W REFLEX MICROSCOPIC
GLUCOSE, UA: NEGATIVE mg/dL
HGB URINE DIPSTICK: NEGATIVE
KETONES UR: NEGATIVE mg/dL
Leukocytes, UA: NEGATIVE
Nitrite: NEGATIVE
PH: 5.5 (ref 5.0–8.0)
Protein, ur: NEGATIVE mg/dL
SPECIFIC GRAVITY, URINE: 1.033 — AB (ref 1.005–1.030)

## 2015-09-19 LAB — COMPREHENSIVE METABOLIC PANEL
ALBUMIN: 4 g/dL (ref 3.5–5.0)
ALK PHOS: 88 U/L (ref 38–126)
ALT: 14 U/L (ref 14–54)
ANION GAP: 8 (ref 5–15)
AST: 18 U/L (ref 15–41)
BILIRUBIN TOTAL: 0.6 mg/dL (ref 0.3–1.2)
BUN: 13 mg/dL (ref 6–20)
CO2: 27 mmol/L (ref 22–32)
Calcium: 9.3 mg/dL (ref 8.9–10.3)
Chloride: 105 mmol/L (ref 101–111)
Creatinine, Ser: 0.83 mg/dL (ref 0.44–1.00)
GFR calc Af Amer: >60 mL/min (ref >60)
GFR calc non Af Amer: >60 mL/min (ref >60)
GLUCOSE: 98 mg/dL (ref 65–99)
POTASSIUM: 3.9 mmol/L (ref 3.5–5.1)
SODIUM: 140 mmol/L (ref 135–145)
TOTAL PROTEIN: 7.8 g/dL (ref 6.5–8.1)

## 2015-09-19 LAB — CBC
HEMATOCRIT: 43.6 % (ref 36.0–46.0)
HEMOGLOBIN: 14.6 g/dL (ref 12.0–15.0)
MCH: 28.9 pg (ref 26.0–34.0)
MCHC: 33.5 g/dL (ref 30.0–36.0)
MCV: 86.2 fL (ref 78.0–100.0)
Platelets: 520 10*3/uL — ABNORMAL HIGH (ref 150–400)
RBC: 5.06 MIL/uL (ref 3.87–5.11)
RDW: 14.9 % (ref 11.5–15.5)
WBC: 13.1 10*3/uL — ABNORMAL HIGH (ref 4.0–10.5)

## 2015-09-19 LAB — LIPASE, BLOOD: Lipase: 35 U/L (ref 11–51)

## 2015-09-19 LAB — I-STAT TROPONIN, ED: TROPONIN I, POC: 0 ng/mL (ref 0.00–0.08)

## 2015-09-19 MED ORDER — PANTOPRAZOLE SODIUM 20 MG PO TBEC: 20.0000 mg | DELAYED_RELEASE_TABLET | Freq: Every day | ORAL | Status: DC

## 2015-09-19 MED ORDER — ONDANSETRON 4 MG PO TBDP: ORAL_TABLET | ORAL | Status: DC

## 2015-09-19 MED ORDER — ONDANSETRON HCL 4 MG/2ML IJ SOLN
4.0000 mg | Freq: Once | INTRAMUSCULAR | Status: AC
  Administered 2015-09-19: 4 mg via INTRAVENOUS

## 2015-09-19 MED ORDER — ONDANSETRON 4 MG PO TBDP
ORAL_TABLET | ORAL | Status: AC
  Filled 2015-09-19: qty 1

## 2015-09-19 MED ORDER — PANTOPRAZOLE SODIUM 40 MG IV SOLR
40.0000 mg | Freq: Once | INTRAVENOUS | Status: AC
  Administered 2015-09-19: 40 mg via INTRAVENOUS
  Filled 2015-09-19: qty 40

## 2015-09-19 MED ORDER — SODIUM CHLORIDE 0.9 % IV BOLUS (SEPSIS)
1000.0000 mL | Freq: Once | INTRAVENOUS | Status: AC
  Administered 2015-09-19: 1000 mL via INTRAVENOUS

## 2015-09-19 MED ORDER — HYDROMORPHONE HCL 1 MG/ML IJ SOLN
1.0000 mg | Freq: Once | INTRAMUSCULAR | Status: AC
  Administered 2015-09-19: 1 mg via INTRAVENOUS
  Filled 2015-09-19: qty 1

## 2015-09-19 MED ORDER — IOHEXOL 300 MG/ML  SOLN
100.0000 mL | Freq: Once | INTRAMUSCULAR | Status: AC | PRN
  Administered 2015-09-19: 100 mL via INTRAVENOUS

## 2015-09-19 MED ORDER — ONDANSETRON 4 MG PO TBDP
4.0000 mg | ORAL_TABLET | Freq: Once | ORAL | Status: AC | PRN
  Administered 2015-09-19: 4 mg via ORAL

## 2015-09-19 MED ORDER — ONDANSETRON HCL 4 MG/2ML IJ SOLN
4.0000 mg | Freq: Once | INTRAMUSCULAR | Status: DC
  Filled 2015-09-19: qty 2

## 2015-09-19 NOTE — Discharge Instructions (Signed)
Follow up with your md or dr. Cristina Gong next week.

## 2015-09-19 NOTE — ED Provider Notes (Signed)
CSN: [id number]     Arrival date & time 09/19/15  0544 History   First MD Initiated Contact with Patient 09/19/15 [phone]     Chief Complaint  Patient presents with  . Abdominal Pain  . Nausea  . Emesis     (Consider location/radiation/quality/duration/timing/severity/associated sxs/prior Treatment) Patient is a 50 y.o. female presenting with abdominal pain and vomiting. The history is provided by the patient (Patient complains of epigastric discomfort with vomiting. Patient has a history of gastroparesis).  Abdominal Pain Pain location:  Epigastric Pain quality: aching   Pain radiates to:  Does not radiate Pain severity:  Moderate Onset quality:  Sudden Timing:  Constant Progression:  Waxing and waning Chronicity:  New Associated symptoms: nausea and vomiting   Associated symptoms: no chest pain, no cough, no diarrhea, no fatigue and no hematuria   Emesis Associated symptoms: abdominal pain   Associated symptoms: no diarrhea and no headaches     Past Medical History  Diagnosis Date  . Cardiomyopathy     resolved  . Chest pain     chronicc  . Hyperlipidemia   . HTN (hypertension)   . Hypothyroidism   . Adrenal insufficiency ([place])     diagnosed 2012  . Anxiety   . Nondiabetic gastroparesis   . CAD (coronary artery disease)     Cath 2008 EF normal. RCA 50-60, Septal 50%. Myoview 3/12: EF 53% normal perfusion  . Chronic back pain   . QT prolongation   . Mitral valve prolapse   . Gastroparesis   . Chronic diarrhea   . Astigmatism   . Concussion     sept 28th 2014  . CHF (congestive heart failure) ([place])   . Cardiac arrest ([place])     2/2 adissonian crisis  . Tobacco abuse     down to 2 cigarettes per day   Past Surgical History  Procedure Laterality Date  . Cholecystectomy    . Spine surgery    . Vesicovaginal fistula closure w/ tah    . Varicose vein surgery    . Abdominal hysterectomy    . Cardiac catheterization  03/2007    showed 60% lesion in the right  coronary artery  . Left heart catheterization with coronary angiogram N/A 04/13/2012    Procedure: LEFT HEART CATHETERIZATION WITH CORONARY ANGIOGRAM;  Surgeon: Hillary Bow, MD;  Location: [facility] CATH LAB;  Service: Cardiovascular;  Laterality: N/A;   Family History  Problem Relation Age of Onset  . Coronary artery disease    . Heart attack Mother   . Cancer Father     Colon   Social History  Substance Use Topics  . Smoking status: Current Every Day Smoker -- 0.50 packs/day for 10 years    Types: Cigarettes  . Smokeless tobacco: Never Used     Comment: smokes 6-7 cigarettes per day  . Alcohol Use: No   OB History    Gravida Para Term Preterm AB TAB SAB Ectopic Multiple Living   3 2 2  0 1  1   2      Review of Systems  Constitutional: Negative for appetite change and fatigue.  HENT: Negative for congestion, ear discharge and sinus pressure.   Eyes: Negative for discharge.  Respiratory: Negative for cough.   Cardiovascular: Negative for chest pain.  Gastrointestinal: Positive for nausea, vomiting and abdominal pain. Negative for diarrhea.  Genitourinary: Negative for frequency and hematuria.  Musculoskeletal: Negative for back pain.  Skin: Negative for rash.  Neurological: Negative for  seizures and headaches.  Psychiatric/Behavioral: Negative for hallucinations.      Allergies  Bee venom; Doxycycline; Erythromycin; Ibuprofen; Penicillins; Cephalexin; Dust mite extract; Other; Morphine and related; Sulfonamide derivatives; and Tramadol  Home Medications   Prior to Admission medications   Medication Sig Start Date End Date Taking? Authorizing Provider  amLODipine (NORVASC) 5 MG tablet TAKE 1 TABLET (5 MG TOTAL) BY MOUTH DAILY. 07/25/15  Yes Pixie Casino, MD  aspirin 81 MG chewable tablet Chew 81 mg by mouth daily.    Yes Historical Provider, MD  cholecalciferol (VITAMIN D) 1000 UNITS tablet Take 1,000 Units by mouth daily.   Yes Historical Provider, MD  cyclobenzaprine  (FLEXERIL) 10 MG tablet Take 10 mg by mouth 3 (three) times daily as needed for muscle spasms.    Yes Historical Provider, MD  hydrochlorothiazide (HYDRODIURIL) 25 MG tablet Take 12.5 mg by mouth 2 (two) times daily as needed (Blood pressure rises).    Yes Historical Provider, MD  hydrocortisone (CORTEF) 10 MG tablet Take 10-20 mg by mouth 2 (two) times daily. Pt takes 20 mg in the morning and 10 mg at night   Yes Historical Provider, MD  levothyroxine (SYNTHROID, LEVOTHROID) 75 MCG tablet Take 75 mcg by mouth daily before breakfast.   Yes Historical Provider, MD  LORazepam (ATIVAN) 2 MG tablet Take 2 mg by mouth every 6 (six) hours as needed for anxiety.   Yes Historical Provider, MD  metoCLOPramide (REGLAN) 10 MG tablet Take 10 mg by mouth 3 (three) times daily. Runny stools   Yes Historical Provider, MD  nitroGLYCERIN (NITROSTAT) 0.4 MG SL tablet Place 0.4 mg under the tongue every 5 (five) minutes as needed for chest pain.   Yes Historical Provider, MD  ondansetron (ZOFRAN) 4 MG tablet Take 4-8 mg by mouth every 4 (four) hours as needed for nausea.    Yes Historical Provider, MD  oxycodone (ROXICODONE) 30 MG immediate release tablet Take 30 mg by mouth every 6 (six) hours as needed for pain.    Yes Historical Provider, MD  pramipexole (MIRAPEX) 0.125 MG tablet Take 0.125 mg by mouth at bedtime as needed (restles leg syndrome).   Yes Historical Provider, MD  vitamin C (ASCORBIC ACID) 500 MG tablet Take 500 mg by mouth daily.   Yes Historical Provider, MD  zolpidem (AMBIEN) 10 MG tablet Take 10 mg by mouth at bedtime.  03/20/14  Yes Historical Provider, MD  meclizine (ANTIVERT) 25 MG tablet Take 1 tablet (25 mg total) by mouth 4 (four) times daily as needed for dizziness. 06/15/13   Rolland Porter, MD  ondansetron (ZOFRAN ODT) 4 MG disintegrating tablet 4mg  ODT q4 hours prn nausea/vomit 09/19/15   Milton Ferguson, MD  pantoprazole (PROTONIX) 20 MG tablet Take 1 tablet (20 mg total) by mouth daily. 09/19/15    Milton Ferguson, MD   BP 143/82 mmHg  Pulse 80  Temp(Src) 97.9 F (36.6 C) (Oral)  Resp 20  SpO2 99% Physical Exam  Constitutional: She is oriented to person, place, and time. She appears well-developed.  HENT:  Head: Normocephalic.  Eyes: Conjunctivae and EOM are normal. No scleral icterus.  Neck: Neck supple. No thyromegaly present.  Cardiovascular: Normal rate and regular rhythm.  Exam reveals no gallop and no friction rub.   No murmur heard. Pulmonary/Chest: No stridor. She has no wheezes. She has no rales. She exhibits no tenderness.  Abdominal: She exhibits no distension. There is tenderness. There is no rebound.  Tender epigastric area  Musculoskeletal: Normal  range of motion. She exhibits no edema.  Lymphadenopathy:    She has no cervical adenopathy.  Neurological: She is oriented to person, place, and time. She exhibits normal muscle tone. Coordination normal.  Skin: No rash noted. No erythema.  Psychiatric: She has a normal mood and affect. Her behavior is normal.    ED Course  Procedures (including critical care time) Labs Review Labs Reviewed  CBC - Abnormal; Notable for the following:    WBC 13.1 (*)    Platelets 520 (*)    All other components within normal limits  URINALYSIS, ROUTINE W REFLEX MICROSCOPIC (NOT AT [facility]) - Abnormal; Notable for the following:    APPearance CLOUDY (*)    Specific Gravity, Urine 1.033 (*)    Bilirubin Urine SMALL (*)    All other components within normal limits  LIPASE, BLOOD  COMPREHENSIVE METABOLIC PANEL  I-STAT TROPOININ, ED    Imaging Review Ct Abdomen Pelvis W Contrast  09/19/2015  CLINICAL DATA:  Upper abdominal pain, nausea and vomiting starting yesterday EXAM: CT ABDOMEN AND PELVIS WITH CONTRAST TECHNIQUE: Multidetector CT imaging of the abdomen and pelvis was performed using the standard protocol following bolus administration of intravenous contrast. CONTRAST:  159mL OMNIPAQUE IOHEXOL 300 MG/ML  SOLN COMPARISON:  CT  scan 05/11/2014 FINDINGS: Lung bases are unremarkable. Sagittal images of the spine shows mild disc space flattening with mild anterior spurring at L3-L4 level. The patient is status postcholecystectomy. Mild intrahepatic biliary ductal dilatation. Mild fatty infiltration of the liver. CBD measures 8.7 mm in diameter probable postcholecystectomy. No focal hepatic mass. The pancreas, spleen and adrenal glands are unremarkable. Mild atherosclerotic calcifications bilateral common iliac artery. Mild atherosclerotic calcification at left renal artery origin. Kidneys are symmetrical in size and enhancement. No hydronephrosis or hydroureter. There is no small bowel obstruction. No ascites or free air. No adenopathy. Again noted a low lying cecum with tip in mid anterior pelvis just above the urinary bladder. Some liquid stool and gas noted within cecum. No pericecal inflammation. Normal appendix is noted in axial image 68 containing gas. Some colonic stool noted in transverse colon. Mild redundant sigmoid colon. Moderate gas noted in mid sigmoid colon. The uterus is surgically absent. There is a left ovarian cyst measures 2.2 cm. The urinary bladder is unremarkable. Terminal ileum is unremarkable. Delayed renal images shows bilateral renal symmetrical excretion. Bilateral visualized proximal ureter is unremarkable. IMPRESSION: 1. Mild fatty infiltration of the liver. No focal hepatic mass. Mild intrahepatic and extrahepatic biliary ductal dilatation probable postcholecystectomy. 2. No hydronephrosis or hydroureter. 3. Mild atherosclerotic vascular calcifications. 4. Again noted low lying cecum. No pericecal inflammation. Normal appendix. 5. Surgically absent uterus. There is a left ovarian cyst measures 2.2 cm. 6. Mild redundant sigmoid colon. No colonic obstruction. Moderate gas noted mid sigmoid colon. Electronically Signed   By: Lahoma Crocker M.D.   On: 09/19/2015 09:17   I have personally reviewed and evaluated these  images and lab results as part of my medical decision-making.   EKG Interpretation   Date/Time:  Friday September 19 2015 07:56:25 EST Ventricular Rate:  79 PR Interval:  139 QRS Duration: 100 QT Interval:  446 QTC Calculation: 511 R Axis:   87 Text Interpretation:  Sinus rhythm Prolonged QT interval Confirmed by  Adi Seales  MD, Akram Kissick ([id number]) on 09/19/2015 10:24:38 AM      MDM   Final diagnoses:  Gastroparesis    Patient with gastroparesis. CT scan does not show any acute changes. Will send patient  home on Zofran and she will continue the Reglan patient will continue her pain medicine also she is given protonic. She will follow-up follow-up with her family doctor or her GI doctor    Milton Ferguson, MD 09/19/15 1031

## 2015-09-19 NOTE — ED Notes (Signed)
Patient with nausea, vomiting and abdominal pain since yesterday.  Patient is unable to keep any of her meds or food down.

## 2015-09-19 NOTE — ED Notes (Signed)
Patient transported to CT 

## 2015-10-13 DIAGNOSIS — E782 Mixed hyperlipidemia: Secondary | ICD-10-CM | POA: Diagnosis not present

## 2015-10-13 DIAGNOSIS — Z6830 Body mass index (BMI) 30.0-30.9, adult: Secondary | ICD-10-CM | POA: Diagnosis not present

## 2015-10-13 DIAGNOSIS — Z1389 Encounter for screening for other disorder: Secondary | ICD-10-CM | POA: Diagnosis not present

## 2015-10-13 DIAGNOSIS — J9801 Acute bronchospasm: Secondary | ICD-10-CM | POA: Diagnosis not present

## 2015-10-13 DIAGNOSIS — J019 Acute sinusitis, unspecified: Secondary | ICD-10-CM | POA: Diagnosis not present

## 2015-10-13 DIAGNOSIS — E6609 Other obesity due to excess calories: Secondary | ICD-10-CM | POA: Diagnosis not present

## 2015-10-13 DIAGNOSIS — I10 Essential (primary) hypertension: Secondary | ICD-10-CM | POA: Diagnosis not present

## 2015-10-13 DIAGNOSIS — J209 Acute bronchitis, unspecified: Secondary | ICD-10-CM | POA: Diagnosis not present

## 2015-10-13 DIAGNOSIS — G894 Chronic pain syndrome: Secondary | ICD-10-CM | POA: Diagnosis not present

## 2015-10-13 DIAGNOSIS — M1991 Primary osteoarthritis, unspecified site: Secondary | ICD-10-CM | POA: Diagnosis not present
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
## 2015-11-11 ENCOUNTER — Emergency Department (HOSPITAL_COMMUNITY): Payer: Medicare Other

## 2015-11-11 ENCOUNTER — Encounter (HOSPITAL_COMMUNITY): Payer: Self-pay | Admitting: *Deleted

## 2015-11-11 ENCOUNTER — Emergency Department (HOSPITAL_COMMUNITY)
Admission: EM | Admit: 2015-11-11 | Discharge: 2015-11-12 | Disposition: A | Discharge status: Home / Self Care | Payer: Medicare Other | Attending: Emergency Medicine | Admitting: Emergency Medicine

## 2015-11-11 DIAGNOSIS — I2510 Atherosclerotic heart disease of native coronary artery without angina pectoris: Secondary | ICD-10-CM | POA: Diagnosis not present

## 2015-11-11 DIAGNOSIS — Z880 Allergy status to penicillin: Secondary | ICD-10-CM | POA: Diagnosis not present

## 2015-11-11 DIAGNOSIS — Z79899 Other long term (current) drug therapy: Secondary | ICD-10-CM | POA: Insufficient documentation

## 2015-11-11 DIAGNOSIS — K3184 Gastroparesis: Secondary | ICD-10-CM | POA: Diagnosis not present

## 2015-11-11 DIAGNOSIS — I509 Heart failure, unspecified: Secondary | ICD-10-CM | POA: Diagnosis not present

## 2015-11-11 DIAGNOSIS — Z9889 Other specified postprocedural states: Secondary | ICD-10-CM | POA: Diagnosis not present

## 2015-11-11 DIAGNOSIS — E039 Hypothyroidism, unspecified: Secondary | ICD-10-CM | POA: Diagnosis not present

## 2015-11-11 DIAGNOSIS — G8929 Other chronic pain: Secondary | ICD-10-CM | POA: Diagnosis not present

## 2015-11-11 DIAGNOSIS — Z7952 Long term (current) use of systemic steroids: Secondary | ICD-10-CM | POA: Insufficient documentation

## 2015-11-11 DIAGNOSIS — Z7982 Long term (current) use of aspirin: Secondary | ICD-10-CM | POA: Insufficient documentation

## 2015-11-11 DIAGNOSIS — F17210 Nicotine dependence, cigarettes, uncomplicated: Secondary | ICD-10-CM | POA: Diagnosis not present

## 2015-11-11 DIAGNOSIS — F419 Anxiety disorder, unspecified: Secondary | ICD-10-CM | POA: Insufficient documentation

## 2015-11-11 DIAGNOSIS — E785 Hyperlipidemia, unspecified: Secondary | ICD-10-CM | POA: Insufficient documentation

## 2015-11-11 DIAGNOSIS — R1010 Upper abdominal pain, unspecified: Secondary | ICD-10-CM | POA: Diagnosis present

## 2015-11-11 DIAGNOSIS — Z8674 Personal history of sudden cardiac arrest: Secondary | ICD-10-CM | POA: Diagnosis not present

## 2015-11-11 DIAGNOSIS — R079 Chest pain, unspecified: Secondary | ICD-10-CM | POA: Diagnosis not present

## 2015-11-11 DIAGNOSIS — I10 Essential (primary) hypertension: Secondary | ICD-10-CM | POA: Insufficient documentation

## 2015-11-11 LAB — URINE MICROSCOPIC-ADD ON: WBC UA: NONE SEEN WBC/hpf (ref 0–5)

## 2015-11-11 LAB — COMPREHENSIVE METABOLIC PANEL
ALT: 10 U/L — AB (ref 14–54)
AST: 23 U/L (ref 15–41)
Albumin: 3.8 g/dL (ref 3.5–5.0)
Alkaline Phosphatase: 89 U/L (ref 38–126)
Anion gap: 14 (ref 5–15)
BUN: 6 mg/dL (ref 6–20)
CHLORIDE: 105 mmol/L (ref 101–111)
CO2: 24 mmol/L (ref 22–32)
CREATININE: 0.86 mg/dL (ref 0.44–1.00)
Calcium: 9.7 mg/dL (ref 8.9–10.3)
GFR calc Af Amer: >60 mL/min (ref >60)
GFR calc non Af Amer: >60 mL/min (ref >60)
Glucose, Bld: 116 mg/dL — ABNORMAL HIGH (ref 65–99)
Potassium: 3.3 mmol/L — ABNORMAL LOW (ref 3.5–5.1)
SODIUM: 143 mmol/L (ref 135–145)
Total Bilirubin: 0.2 mg/dL — ABNORMAL LOW (ref 0.3–1.2)
Total Protein: 7.5 g/dL (ref 6.5–8.1)

## 2015-11-11 LAB — URINALYSIS, ROUTINE W REFLEX MICROSCOPIC
Bilirubin Urine: NEGATIVE
GLUCOSE, UA: NEGATIVE mg/dL
KETONES UR: NEGATIVE mg/dL
Leukocytes, UA: NEGATIVE
Nitrite: NEGATIVE
PROTEIN: NEGATIVE mg/dL
Specific Gravity, Urine: 1.012 (ref 1.005–1.030)
pH: 6 (ref 5.0–8.0)

## 2015-11-11 LAB — CBC
HCT: 44.7 % (ref 36.0–46.0)
Hemoglobin: 15.2 g/dL — ABNORMAL HIGH (ref 12.0–15.0)
MCH: 28.6 pg (ref 26.0–34.0)
MCHC: 34 g/dL (ref 30.0–36.0)
MCV: 84.2 fL (ref 78.0–100.0)
PLATELETS: 409 10*3/uL — AB (ref 150–400)
RBC: 5.31 MIL/uL — ABNORMAL HIGH (ref 3.87–5.11)
RDW: 14 % (ref 11.5–15.5)
WBC: 14.3 10*3/uL — ABNORMAL HIGH (ref 4.0–10.5)

## 2015-11-11 LAB — I-STAT TROPONIN, ED: Troponin i, poc: 0 ng/mL (ref 0.00–0.08)

## 2015-11-11 LAB — LIPASE, BLOOD: LIPASE: 31 U/L (ref 11–51)

## 2015-11-11 MED ORDER — METOCLOPRAMIDE HCL 5 MG/ML IJ SOLN
10.0000 mg | Freq: Once | INTRAMUSCULAR | Status: AC
  Administered 2015-11-11: 10 mg via INTRAVENOUS
  Filled 2015-11-11: qty 2

## 2015-11-11 MED ORDER — HYDROMORPHONE HCL 1 MG/ML IJ SOLN
1.0000 mg | Freq: Once | INTRAMUSCULAR | Status: AC
  Administered 2015-11-11: 1 mg via INTRAVENOUS
  Filled 2015-11-11: qty 1

## 2015-11-11 MED ORDER — HYDROCODONE-ACETAMINOPHEN 5-325 MG PO TABS
1.0000 | ORAL_TABLET | Freq: Once | ORAL | Status: AC
  Administered 2015-11-11: 1 via ORAL
  Filled 2015-11-11: qty 1

## 2015-11-11 MED ORDER — SODIUM CHLORIDE 0.9 % IV SOLN
1000.0000 mL | INTRAVENOUS | Status: DC
  Administered 2015-11-11: 1000 mL via INTRAVENOUS

## 2015-11-11 MED ORDER — ONDANSETRON 4 MG PO TBDP
ORAL_TABLET | ORAL | Status: AC
  Filled 2015-11-11: qty 1

## 2015-11-11 MED ORDER — ONDANSETRON 4 MG PO TBDP
4.0000 mg | ORAL_TABLET | Freq: Once | ORAL | Status: AC | PRN
  Administered 2015-11-11: 4 mg via ORAL

## 2015-11-11 MED ORDER — SODIUM CHLORIDE 0.9 % IV SOLN
1000.0000 mL | Freq: Once | INTRAVENOUS | Status: AC
  Administered 2015-11-11: 1000 mL via INTRAVENOUS

## 2015-11-11 MED ORDER — PANTOPRAZOLE SODIUM 40 MG IV SOLR
40.0000 mg | Freq: Once | INTRAVENOUS | Status: AC
Start: 2015-11-11 — End: 2015-11-11
  Administered 2015-11-11: 40 mg via INTRAVENOUS
  Filled 2015-11-11: qty 40

## 2015-11-11 NOTE — ED Notes (Signed)
As this RN was stepping out of room to get zofran for pt, pt began c/o "a little bit of chest pain." EKG in process.

## 2015-11-11 NOTE — ED Notes (Signed)
Pt requesting "zofran and pain medication in my IV." Denies improvement with reglan, reports vomiting up norco

## 2015-11-11 NOTE — ED Notes (Signed)
Pt felt like her bp was elevated and states she is here with NVD and chest pain.  BP checked and states she is dizzy

## 2015-11-11 NOTE — ED Provider Notes (Signed)
CSN: [id number]     Arrival date & time 11/11/15  1517 History   First MD Initiated Contact with Patient 11/11/15 2134     Chief Complaint  Patient presents with  . Abdominal Pain     (Consider location/radiation/quality/duration/timing/severity/associated sxs/prior Treatment) Patient is a 50 y.o. female presenting with abdominal pain. The history is provided by the patient.  Abdominal Pain She Has a history of gastroparesis and states that she had onset last night of crampy upper abdominal pain typical of her gastroparesis. There is associated nausea and vomiting. She has been unable to take her pain medication because of vomiting and has been unable to hold her nausea medicine down because of vomiting. She rates pain at 8/10. She denies fever, chills, sweats. There is no diarrhea.  Past Medical History  Diagnosis Date  . Cardiomyopathy     resolved  . Chest pain     chronicc  . Hyperlipidemia   . HTN (hypertension)   . Hypothyroidism   . Adrenal insufficiency ([place])     diagnosed 2012  . Anxiety   . Nondiabetic gastroparesis   . CAD (coronary artery disease)     Cath 2008 EF normal. RCA 50-60, Septal 50%. Myoview 3/12: EF 53% normal perfusion  . Chronic back pain   . QT prolongation   . Mitral valve prolapse   . Gastroparesis   . Chronic diarrhea   . Astigmatism   . Concussion     sept 28th 2014  . CHF (congestive heart failure) ([place])   . Cardiac arrest ([place])     2/2 adissonian crisis  . Tobacco abuse     down to 2 cigarettes per day   Past Surgical History  Procedure Laterality Date  . Cholecystectomy    . Spine surgery    . Vesicovaginal fistula closure w/ tah    . Varicose vein surgery    . Abdominal hysterectomy    . Cardiac catheterization  03/2007    showed 60% lesion in the right coronary artery  . Left heart catheterization with coronary angiogram N/A 04/13/2012    Procedure: LEFT HEART CATHETERIZATION WITH CORONARY ANGIOGRAM;  Surgeon: Hillary Bow, MD;   Location: [facility] CATH LAB;  Service: Cardiovascular;  Laterality: N/A;   Family History  Problem Relation Age of Onset  . Coronary artery disease    . Heart attack Mother   . Cancer Father     Colon   Social History  Substance Use Topics  . Smoking status: Current Every Day Smoker -- 0.50 packs/day for 10 years    Types: Cigarettes  . Smokeless tobacco: Never Used     Comment: smokes 6-7 cigarettes per day  . Alcohol Use: No   OB History    Gravida Para Term Preterm AB TAB SAB Ectopic Multiple Living   3 2 2  0 1  1   2      Review of Systems  Gastrointestinal: Positive for abdominal pain.  All other systems reviewed and are negative.     Allergies  Bee venom; Doxycycline; Erythromycin; Ibuprofen; Penicillins; Cephalexin; Dust mite extract; Other; Morphine and related; Sulfonamide derivatives; and Tramadol  Home Medications   Prior to Admission medications   Medication Sig Start Date End Date Taking? Authorizing Provider  amLODipine (NORVASC) 5 MG tablet TAKE 1 TABLET (5 MG TOTAL) BY MOUTH DAILY. 07/25/15   Pixie Casino, MD  aspirin 81 MG chewable tablet Chew 81 mg by mouth daily.     Historical Provider,  MD  cholecalciferol (VITAMIN D) 1000 UNITS tablet Take 1,000 Units by mouth daily.    Historical Provider, MD  cyclobenzaprine (FLEXERIL) 10 MG tablet Take 10 mg by mouth 3 (three) times daily as needed for muscle spasms.     Historical Provider, MD  hydrochlorothiazide (HYDRODIURIL) 25 MG tablet Take 12.5 mg by mouth 2 (two) times daily as needed (Blood pressure rises).     Historical Provider, MD  hydrocortisone (CORTEF) 10 MG tablet Take 10-20 mg by mouth 2 (two) times daily. Pt takes 20 mg in the morning and 10 mg at night    Historical Provider, MD  levothyroxine (SYNTHROID, LEVOTHROID) 75 MCG tablet Take 75 mcg by mouth daily before breakfast.    Historical Provider, MD  LORazepam (ATIVAN) 2 MG tablet Take 2 mg by mouth every 6 (six) hours as needed for anxiety.     Historical Provider, MD  meclizine (ANTIVERT) 25 MG tablet Take 1 tablet (25 mg total) by mouth 4 (four) times daily as needed for dizziness. 06/15/13   Rolland Porter, MD  metoCLOPramide (REGLAN) 10 MG tablet Take 10 mg by mouth 3 (three) times daily. Runny stools    Historical Provider, MD  nitroGLYCERIN (NITROSTAT) 0.4 MG SL tablet Place 0.4 mg under the tongue every 5 (five) minutes as needed for chest pain.    Historical Provider, MD  ondansetron (ZOFRAN ODT) 4 MG disintegrating tablet 4mg  ODT q4 hours prn nausea/vomit 09/19/15   Milton Ferguson, MD  ondansetron (ZOFRAN) 4 MG tablet Take 4-8 mg by mouth every 4 (four) hours as needed for nausea.     Historical Provider, MD  oxycodone (ROXICODONE) 30 MG immediate release tablet Take 30 mg by mouth every 6 (six) hours as needed for pain.     Historical Provider, MD  pantoprazole (PROTONIX) 20 MG tablet Take 1 tablet (20 mg total) by mouth daily. 09/19/15   Milton Ferguson, MD  pramipexole (MIRAPEX) 0.125 MG tablet Take 0.125 mg by mouth at bedtime as needed (restles leg syndrome).    Historical Provider, MD  vitamin C (ASCORBIC ACID) 500 MG tablet Take 500 mg by mouth daily.    Historical Provider, MD  zolpidem (AMBIEN) 10 MG tablet Take 10 mg by mouth at bedtime.  03/20/14   Historical Provider, MD   BP 139/99 mmHg  Pulse 95  Temp(Src) 98.1 F (36.7 C) (Oral)  Resp 20  Ht 5\' 5"  (1.651 m)  Wt 187 lb (84.823 kg)  BMI 31.12 kg/m2  SpO2 98% Physical Exam  Nursing note and vitals reviewed.  50 year old female, resting comfortably and in no acute distress. Vital signs are significant for hypertension. Oxygen saturation is 98%, which is normal. Head is normocephalic and atraumatic. PERRLA, EOMI. Oropharynx is clear. Neck is nontender and supple without adenopathy or JVD. Back is nontender and there is no CVA tenderness. Lungs are clear without rales, wheezes, or rhonchi. Chest is nontender. Heart has regular rate and rhythm without murmur. Abdomen is  soft, flat, with mild epigastric tenderness. There is no rebound or guarding. There are no masses or hepatosplenomegaly and peristalsis is hypoactive. Extremities have no cyanosis or edema, full range of motion is present. Skin is warm and dry without rash. Neurologic: Mental status is normal, cranial nerves are intact, there are no motor or sensory deficits.  ED Course  Procedures (including critical care time) Labs Review Results for orders placed or performed during the hospital encounter of 11/11/15  Lipase, blood  Result Value Ref Range  Lipase 31 11 - 51 U/L  Comprehensive metabolic panel  Result Value Ref Range   Sodium 143 135 - 145 mmol/L   Potassium 3.3 (L) 3.5 - 5.1 mmol/L   Chloride 105 101 - 111 mmol/L   CO2 24 22 - 32 mmol/L   Glucose, Bld 116 (H) 65 - 99 mg/dL   BUN 6 6 - 20 mg/dL   Creatinine, Ser 0.86 0.44 - 1.00 mg/dL   Calcium 9.7 8.9 - 10.3 mg/dL   Total Protein 7.5 6.5 - 8.1 g/dL   Albumin 3.8 3.5 - 5.0 g/dL   AST 23 15 - 41 U/L   ALT 10 (L) 14 - 54 U/L   Alkaline Phosphatase 89 38 - 126 U/L   Total Bilirubin 0.2 (L) 0.3 - 1.2 mg/dL   GFR calc non Af Amer >60 >60 mL/min   GFR calc Af Amer >60 >60 mL/min   Anion gap 14 5 - 15  CBC  Result Value Ref Range   WBC 14.3 (H) 4.0 - 10.5 K/uL   RBC 5.31 (H) 3.87 - 5.11 MIL/uL   Hemoglobin 15.2 (H) 12.0 - 15.0 g/dL   HCT 44.7 36.0 - 46.0 %   MCV 84.2 78.0 - 100.0 fL   MCH 28.6 26.0 - 34.0 pg   MCHC 34.0 30.0 - 36.0 g/dL   RDW 14.0 11.5 - 15.5 %   Platelets 409 (H) 150 - 400 K/uL  Urinalysis, Routine w reflex microscopic (not at [facility])  Result Value Ref Range   Color, Urine YELLOW YELLOW   APPearance CLOUDY (A) CLEAR   Specific Gravity, Urine 1.012 1.005 - 1.030   pH 6.0 5.0 - 8.0   Glucose, UA NEGATIVE NEGATIVE mg/dL   Hgb urine dipstick TRACE (A) NEGATIVE   Bilirubin Urine NEGATIVE NEGATIVE   Ketones, ur NEGATIVE NEGATIVE mg/dL   Protein, ur NEGATIVE NEGATIVE mg/dL   Nitrite NEGATIVE NEGATIVE    Leukocytes, UA NEGATIVE NEGATIVE  Urine microscopic-add on  Result Value Ref Range   Squamous Epithelial / LPF 6-30 (A) NONE SEEN   WBC, UA NONE SEEN 0 - 5 WBC/hpf   RBC / HPF 0-5 0 - 5 RBC/hpf   Bacteria, UA RARE (A) NONE SEEN   Casts HYALINE CASTS (A) NEGATIVE  I-stat troponin, ED (not at [facility])  Result Value Ref Range   Troponin i, poc 0.00 0.00 - 0.08 ng/mL   Comment 3           Imaging Review Dg Chest 2 View  11/11/2015  CLINICAL DATA:  Chest pain beginning 2 p.m. today. No known injury. Initial encounter. EXAM: CHEST  2 VIEW COMPARISON:  PA and lateral chest 05/25/2015 and 11/29/2013. FINDINGS: The lungs are clear. Heart size is normal. There is no pneumothorax or pleural effusion. No focal bony abnormality is identified. The patient is status post cervical fusion and cholecystectomy. IMPRESSION: No acute disease. Electronically Signed   By: Inge Rise M.D.   On: 11/11/2015 16:04   I have personally reviewed and evaluated these images and lab results as part of my medical decision-making.   EKG Interpretation   Date/Time:  Tuesday November 11 2015 15:42:07 EST Ventricular Rate:  94 PR Interval:  118 QRS Duration: 92 QT Interval:  380 QTC Calculation: 475 R Axis:   82 Text Interpretation:  Normal sinus rhythm Nonspecific ST abnormality  Abnormal ECG When compared with ECG of 09/19/2015, QT has shortened  Confirmed by Roxanne Mins  MD, Hoda Hon ([id number]) on 11/11/2015 9:02:13  PM      MDM   Final diagnoses:  Gastroparesis    Abdominal pain, nausea, vomiting, consistent with gastroparesis. Old records reviewed and she does have prior ED visits for gastroparesis and had a negative CT of her abdomen and pelvis at an ED visit 2 months ago. Abdominal exam is benign tonight. She will be given IV fluids, hydromorphone, metoclopramide, and pantoprazole. Of note, she was given a perception for pantoprazole at her last ED visit but states that she was unable to get it filled.  Following  above-noted treatment, there was slight improvement. She may need additional hydromorphone. Case is signed out to Dr. Shayne Alken, MD [phone] [id number]

## 2015-11-11 NOTE — ED Notes (Addendum)
Iv attempted by two different RN's without success; IV team at bedside

## 2015-11-11 NOTE — ED Notes (Signed)
C/o abd pain since yesterday. States she has a hx of gastroparesis. States that the pain is the same. Acid refluf, diarrhea, nausea and vomiting present.

## 2015-11-12 MED ORDER — HYDROMORPHONE HCL 1 MG/ML IJ SOLN
INTRAMUSCULAR | Status: AC
  Filled 2015-11-12: qty 1

## 2015-11-12 MED ORDER — HYDROMORPHONE HCL 1 MG/ML IJ SOLN
1.0000 mg | Freq: Once | INTRAMUSCULAR | Status: AC
  Administered 2015-11-12: 1 mg via INTRAVENOUS

## 2015-11-12 MED ORDER — HYDROMORPHONE HCL 1 MG/ML IJ SOLN
1.0000 mg | Freq: Once | INTRAMUSCULAR | Status: AC
  Administered 2015-11-12: 1 mg via INTRAVENOUS
  Filled 2015-11-12: qty 1

## 2015-11-12 NOTE — Discharge Instructions (Signed)
Gastroparesis [name], see your primary doctor within 3 days for close follow up.  If any symptoms worsen, come back to the ED immediately. Thank you. Gastroparesis, also called delayed gastric emptying, is a condition in which food takes longer than normal to empty from the stomach. The condition is usually long-lasting (chronic). CAUSES This condition may be caused by:  An endocrine disorder, such as hypothyroidism or diabetes. Diabetes is the most common cause of this condition.  A nervous system disease, such as Parkinson disease or multiple sclerosis.  Cancer, infection, or surgery of the stomach or vagus nerve.  A connective tissue disorder, such as scleroderma.  Certain medicines. In most cases, the cause is not known. RISK FACTORS This condition is more likely to develop in:  People with certain disorders, including endocrine disorders, eating disorders, amyloidosis, and scleroderma.  People with certain diseases, including Parkinson disease or multiple sclerosis.  People with cancer or infection of the stomach or vagus nerve.  People who have had surgery on the stomach or vagus nerve.  People who take certain medicines.  Women. SYMPTOMS Symptoms of this condition include:  An early feeling of fullness when eating.  Nausea.  Weight loss.  Vomiting.  Heartburn.  Abdominal bloating.  Inconsistent blood glucose levels.  Lack of appetite.  Acid from the stomach coming up into the esophagus (gastroesophageal reflux).  Spasms of the stomach. Symptoms may come and go. DIAGNOSIS This condition is diagnosed with tests, such as:  Tests that check how long it takes food to move through the stomach and intestines. These tests include:  Upper gastrointestinal (GI) series. In this test, X-rays of the intestines are taken after you drink a liquid. The liquid makes the intestines show up better on the X-rays.  Gastric emptying scintigraphy. In this test, scans  are taken after you eat food that contains a small amount of radioactive material.  Wireless capsule GI monitoring system. This test involves swallowing a capsule that records information about movement through the stomach.  Gastric manometry. This test measures electrical and muscular activity in the stomach. It is done with a thin tube that is passed down the throat and into the stomach.  Endoscopy. This test checks for abnormalities in the lining of the stomach. It is done with a long, thin tube that is passed down the throat and into the stomach.  An ultrasound. This test can help rule out gallbladder disease or pancreatitis as a cause of your symptoms. It uses sound waves to take pictures of the inside of your body. TREATMENT There is no cure for gastroparesis. This condition may be managed with:  Treatment of the underlying condition causing the gastroparesis.  Lifestyle changes, including exercise and dietary changes. Dietary changes can include:  Changes in what and when you eat.  Eating smaller meals more often.  Eating low-fat foods.  Eating low-fiber forms of high-fiber foods, such as cooked vegetables instead of raw vegetables.  Having liquid foods in place of solid foods. Liquid foods are easier to digest.  Medicines. These may be given to control nausea and vomiting and to stimulate stomach muscles.  Getting food through a feeding tube. This may be done in severe cases.  A gastric neurostimulator. This is a device that is inserted into the body with surgery. It helps improve stomach emptying and control nausea and vomiting. HOME CARE INSTRUCTIONS  Follow your health care provider's instructions about exercise and diet.  Take medicines only as directed by your health care  provider. SEEK MEDICAL CARE IF:  Your symptoms do not improve with treatment.  You have new symptoms. SEEK IMMEDIATE MEDICAL CARE IF:  You have severe abdominal pain that does not improve with  treatment.  You have nausea that does not go away.  You cannot keep fluids down.   This information is not intended to replace advice given to you by your health care provider. Make sure you discuss any questions you have with your health care provider.   Document Released: 08/23/2005 Document Revised: 01/07/2015 Document Reviewed: 08/19/2014 Elsevier Interactive Patient Education [employer].

## 2015-11-12 NOTE — ED Provider Notes (Signed)
Patient signed out as pending pain control for DC.  She was already given dilaudid and required an additional dose.  She states she feel better now and comfortable going home.  PCP fu advised within 3 days.  She appears comfortable and in NAD.  She is resting comfortably in the room.  VS remain within her normal limits and she is safe for DC.  Everlene Balls, MD 11/12/15 [phone]

## 2016-01-05 DIAGNOSIS — W57XXXA Bitten or stung by nonvenomous insect and other nonvenomous arthropods, initial encounter: Secondary | ICD-10-CM | POA: Diagnosis not present

## 2016-01-05 DIAGNOSIS — G894 Chronic pain syndrome: Secondary | ICD-10-CM | POA: Diagnosis not present

## 2016-01-05 DIAGNOSIS — Z6830 Body mass index (BMI) 30.0-30.9, adult: Secondary | ICD-10-CM | POA: Diagnosis not present

## 2016-01-05 DIAGNOSIS — L0292 Furuncle, unspecified: Secondary | ICD-10-CM | POA: Diagnosis not present

## 2016-01-05 DIAGNOSIS — L2089 Other atopic dermatitis: Secondary | ICD-10-CM | POA: Diagnosis not present

## 2016-01-05 DIAGNOSIS — Z1389 Encounter for screening for other disorder: Secondary | ICD-10-CM | POA: Diagnosis not present

## 2016-01-13 IMAGING — CT CT MAXILLOFACIAL W/O CM
3 series · 15 of 47 positions shown, 18 images · non-contrast
Comparison: CT 08/24/2013.

CLINICAL DATA: Struck in the right eye with baseball today. Initial
encounter.

EXAM:
CT MAXILLOFACIAL WITHOUT CONTRAST
TECHNIQUE: Multidetector CT imaging of the maxillofacial structures was
performed. Multiplanar CT image reconstructions were also generated.
A small metallic BB was placed on the right temple in order to
reliably differentiate right from left.

[Series 2: facial/ orbits 2.0 h30s · axial · 0.29mm/px · z∈[-254,-122]mm · 9 of 78 slices shown, 12 images]
[im 6/78  brain]
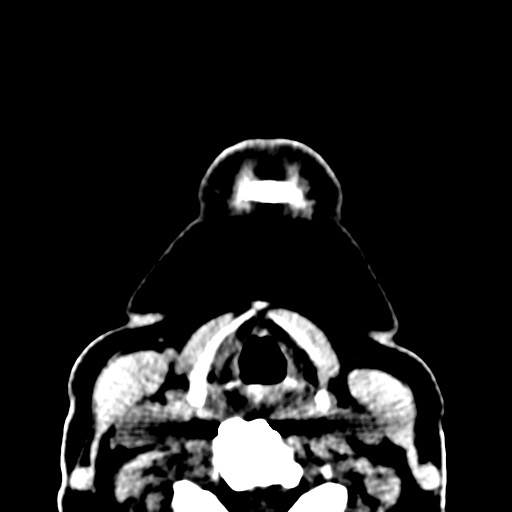
[im 6/78  bone]
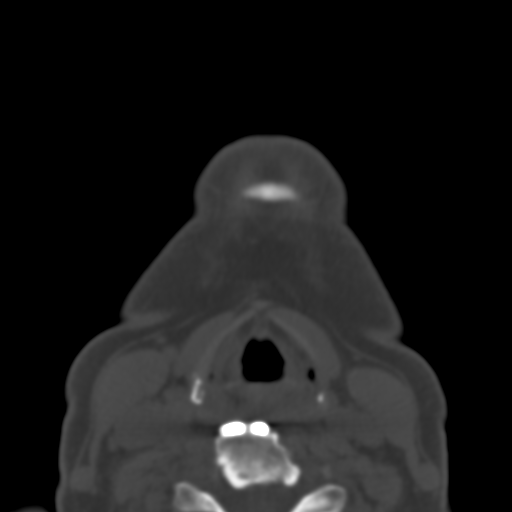
[im 14/78  bone]
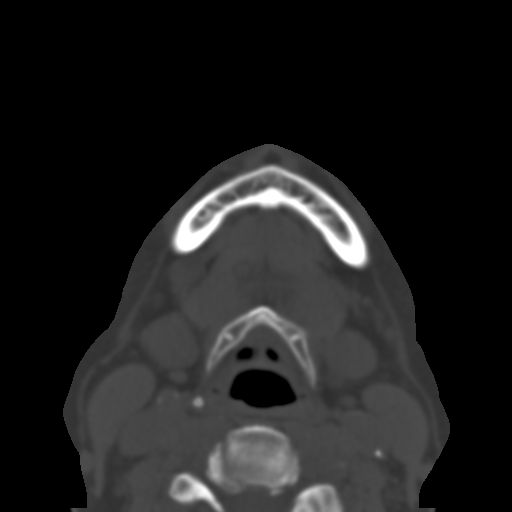
[im 22/78  bone]
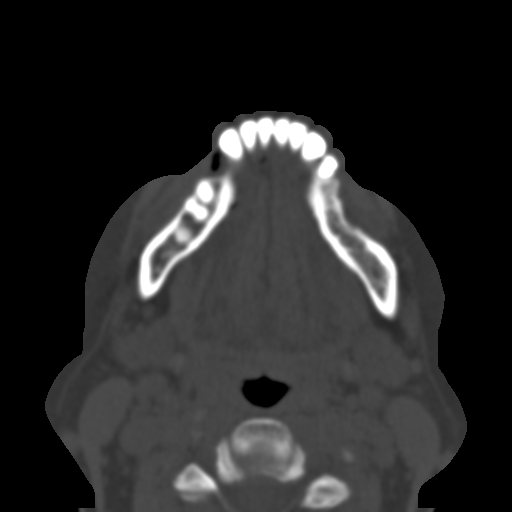
[im 30/78  bone]
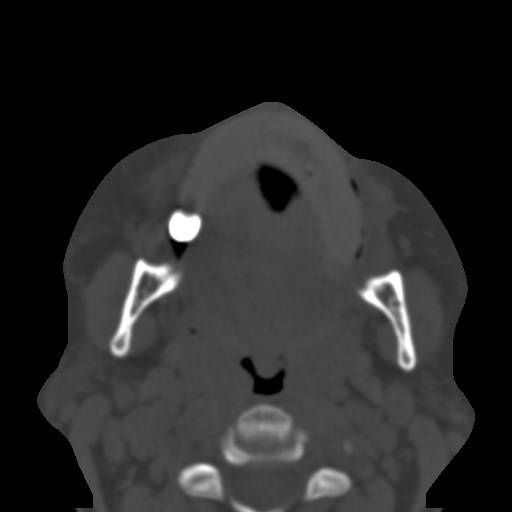
[im 40/78  brain]
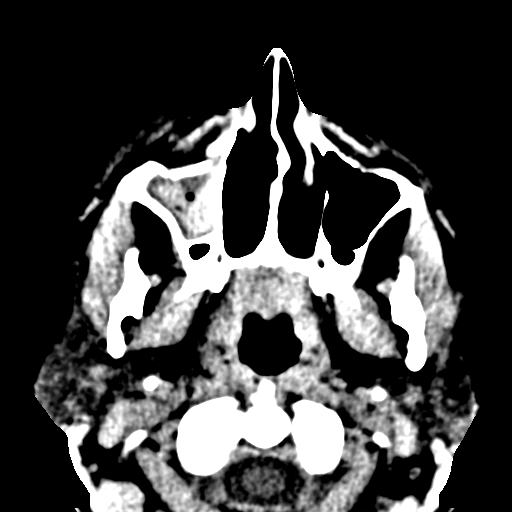
[im 40/78  bone]
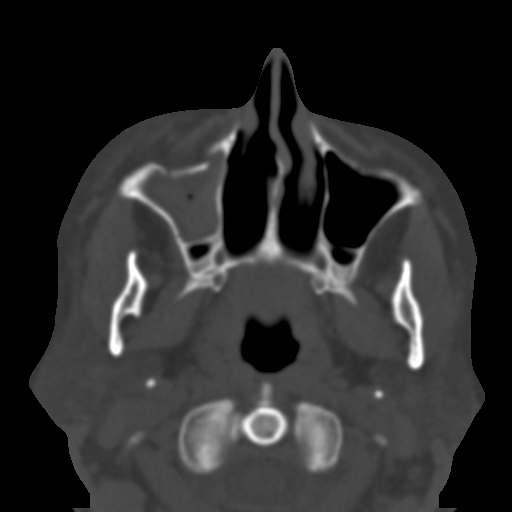
[im 48/78  bone]
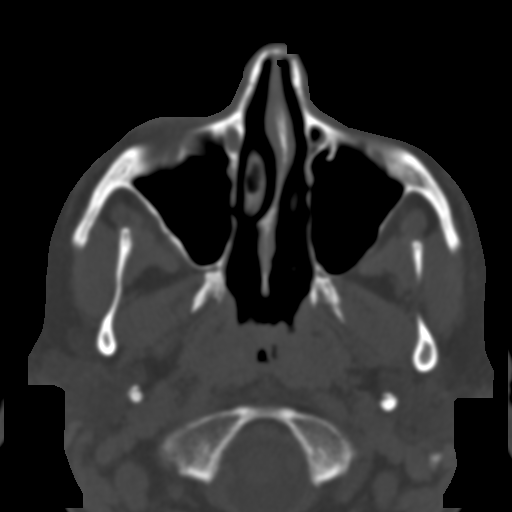
[im 56/78  bone]
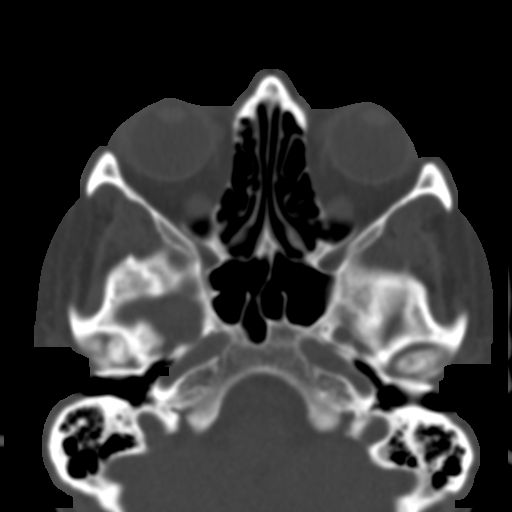
[im 64/78  bone]
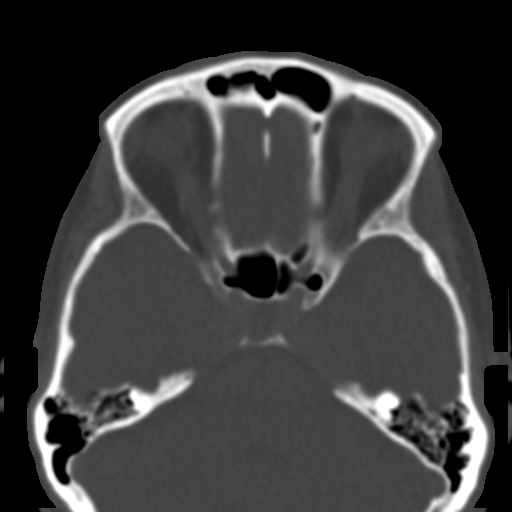
[im 72/78  brain]
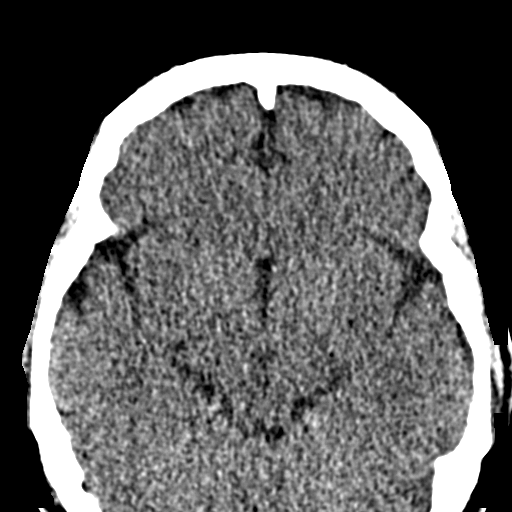
[im 72/78  bone]
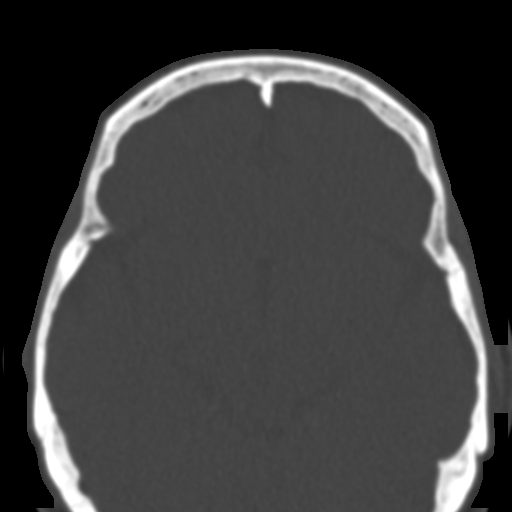

[Series 4: coronal st · coronal · 0.35mm/px · 3 of 65 slices shown]
[im 22/65  bone]
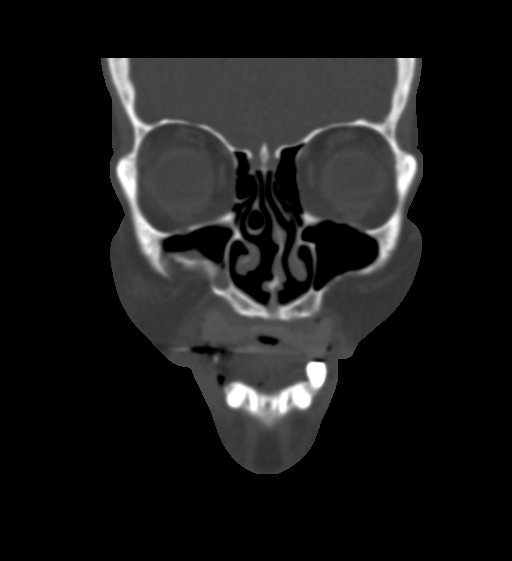
[im 29/65  bone]
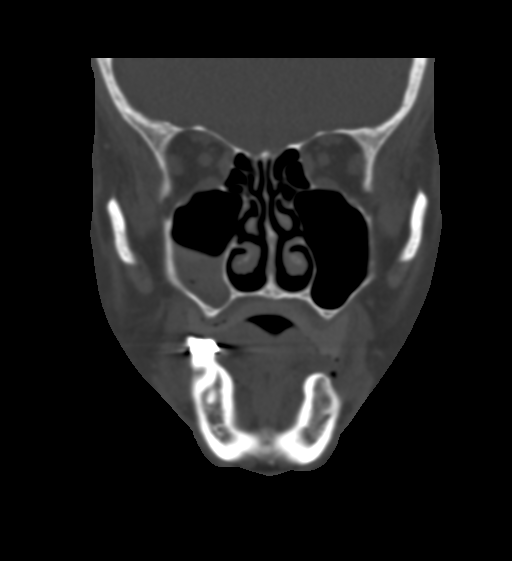
[im 36/65  bone]
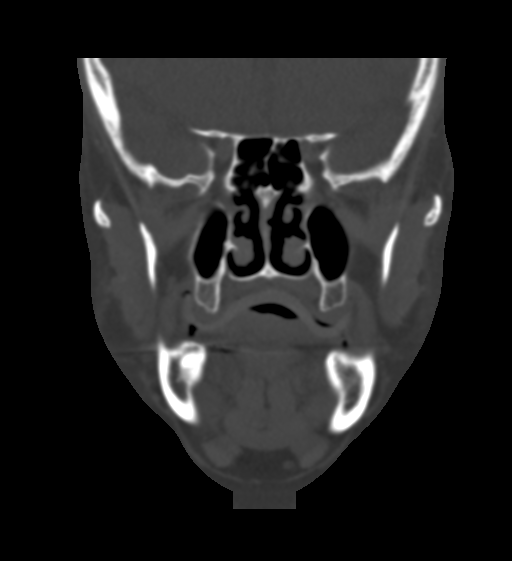

[Series 6: sagittal st · sagittal · 0.34mm/px · 3 of 74 slices shown]
[im 25/74  bone]
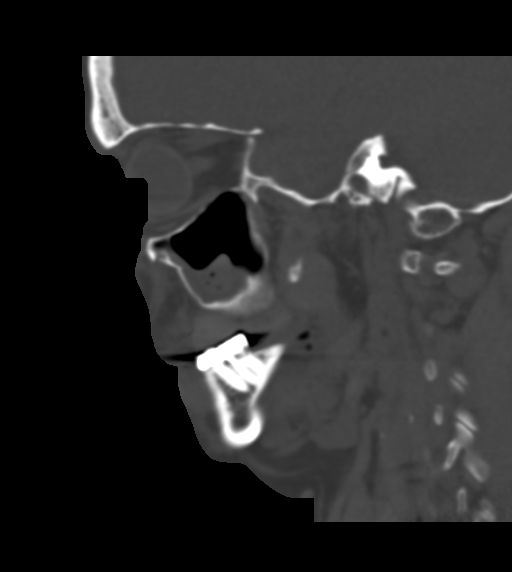
[im 37/74  bone]
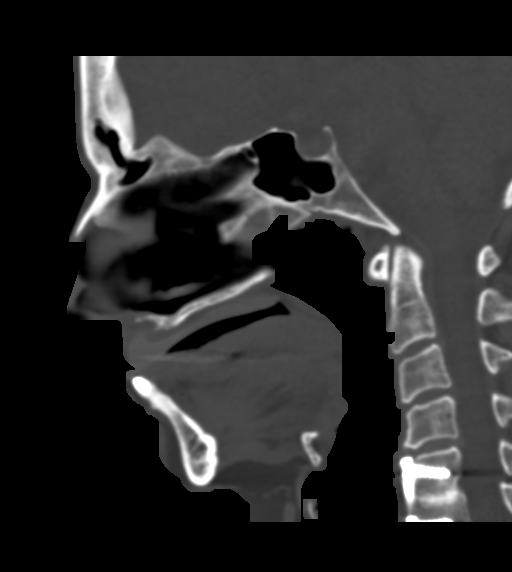
[im 49/74  bone]
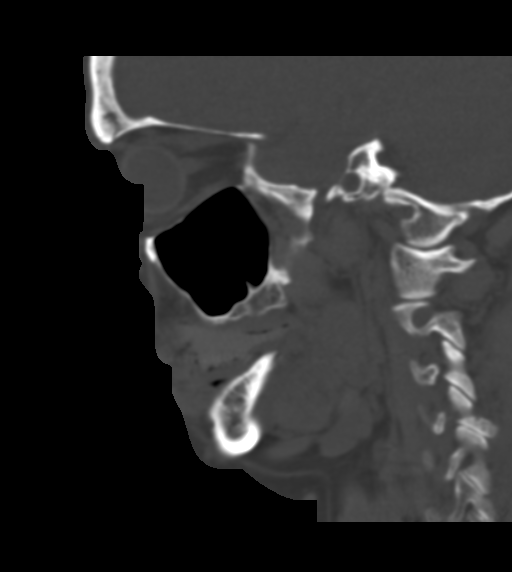

[15 of 47 positions shown; findings below may reference images not displayed]

FINDINGS: There is a comminuted fracture of the anterior wall of the right
maxillary sinus. This fracture demonstrates up to 6 mm of
depression. No orbital floor or other orbital fracture demonstrated.
There is no other evidence of acute facial fracture. The mandible is
intact.

Chronic asymmetric right TMJ degenerative changes are noted. The
mastoid air cells and middle ears are clear. There is blood within
the right maxillary sinus. The additional paranasal sinuses are
clear. Previous cervical fusion noted.

There is mild periorbital soft tissue swelling on the right. No
postseptal hematoma identified. The globes are intact. The optic
nerves and extraocular muscles appear normal.
IMPRESSION: 1. Depressed and mildly comminuted fracture of the anterior wall of
the right maxillary sinus.
2. No evidence of orbital or other facial fracture.
3. Periorbital soft tissue swelling on the right. No evidence of
globe injury.

## 2016-02-03 IMAGING — CR DG CHEST 2V
2 series · 2 of 2 positions shown · non-contrast
Comparison: 11/29/2013

CLINICAL DATA: Pt reports chest heaviness that started last night.
Pain radiates into L side of neck and L arm. Also c/o sob. Hx chf
and mitral valve prolapse.

EXAM:
CHEST  2 VIEW

[chest lat]
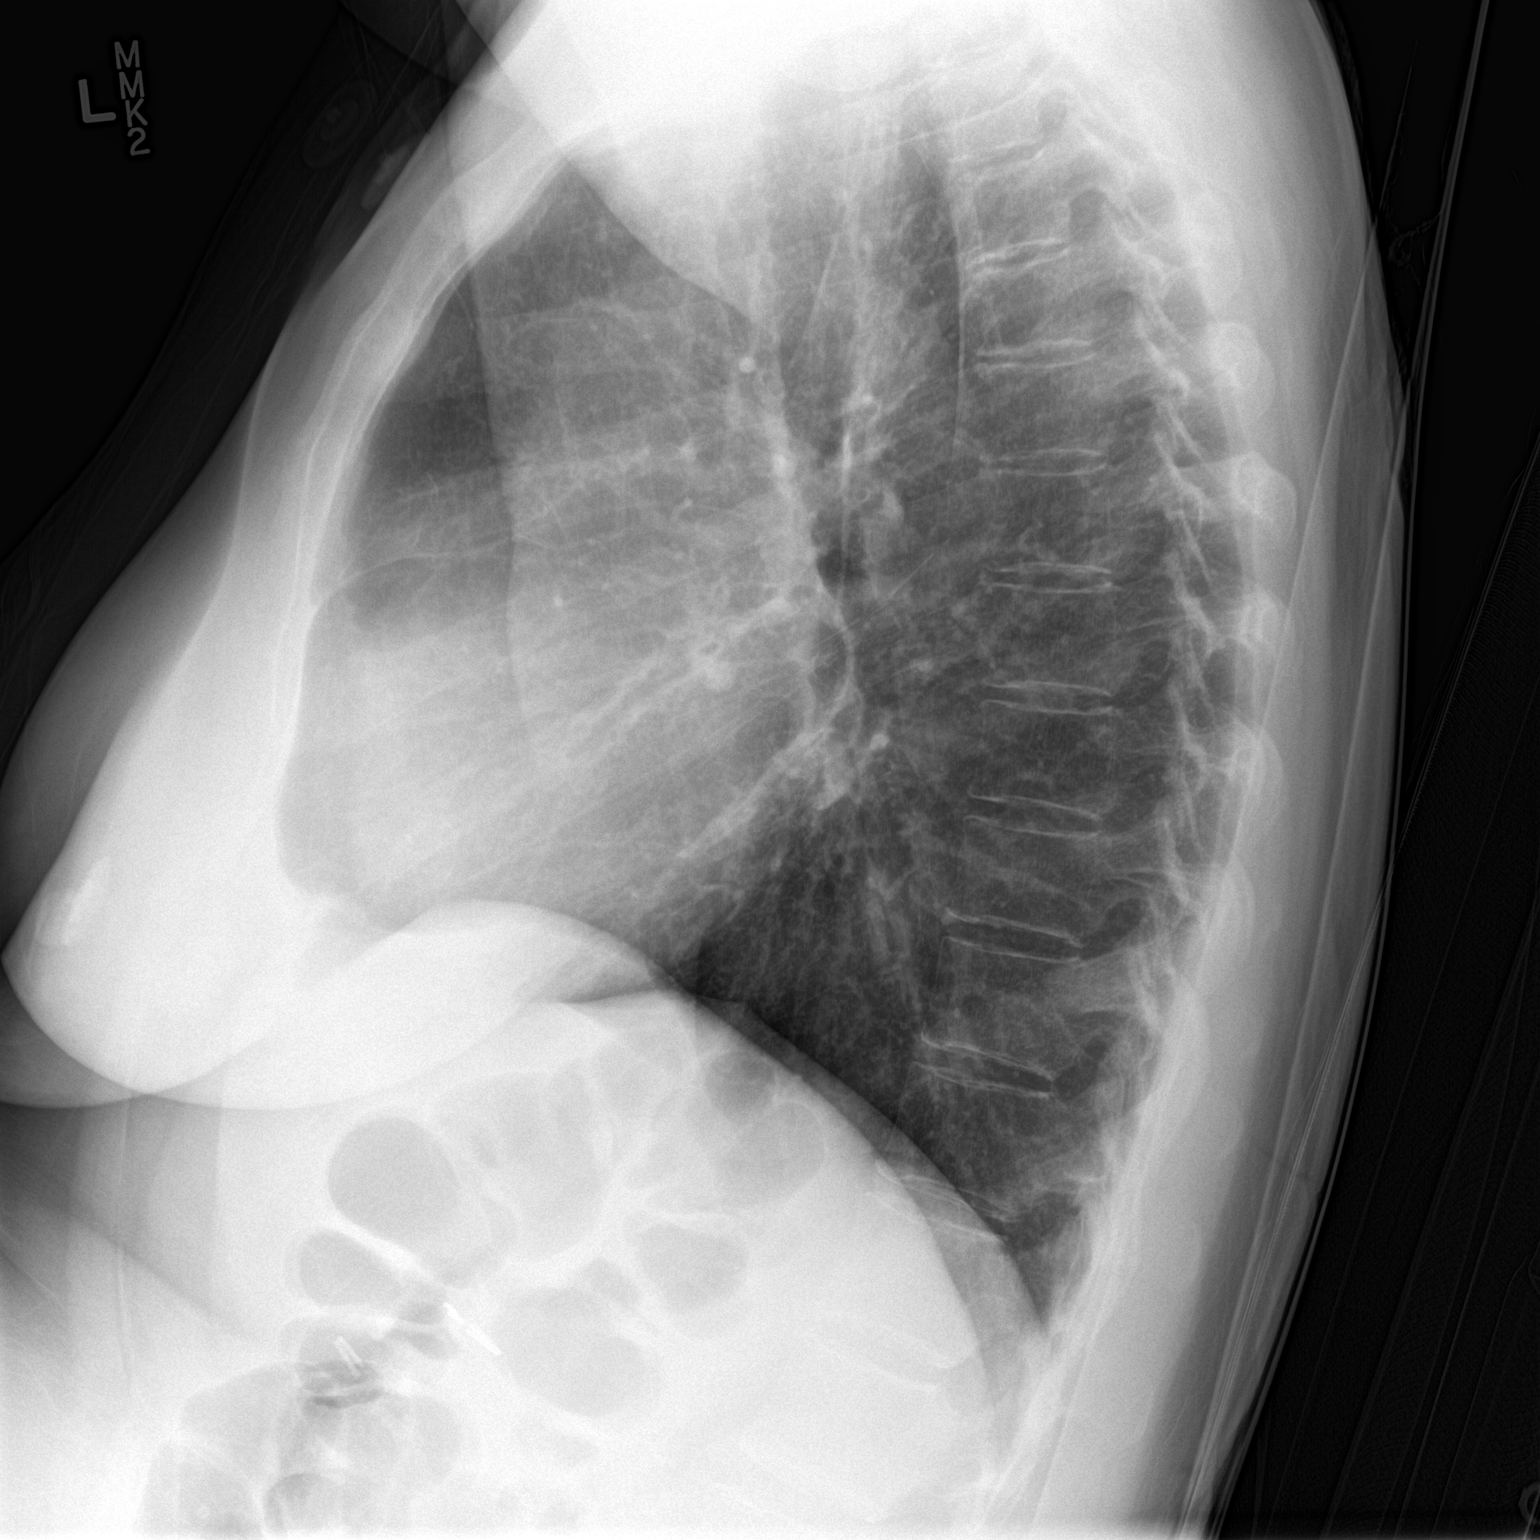

[chest ap]
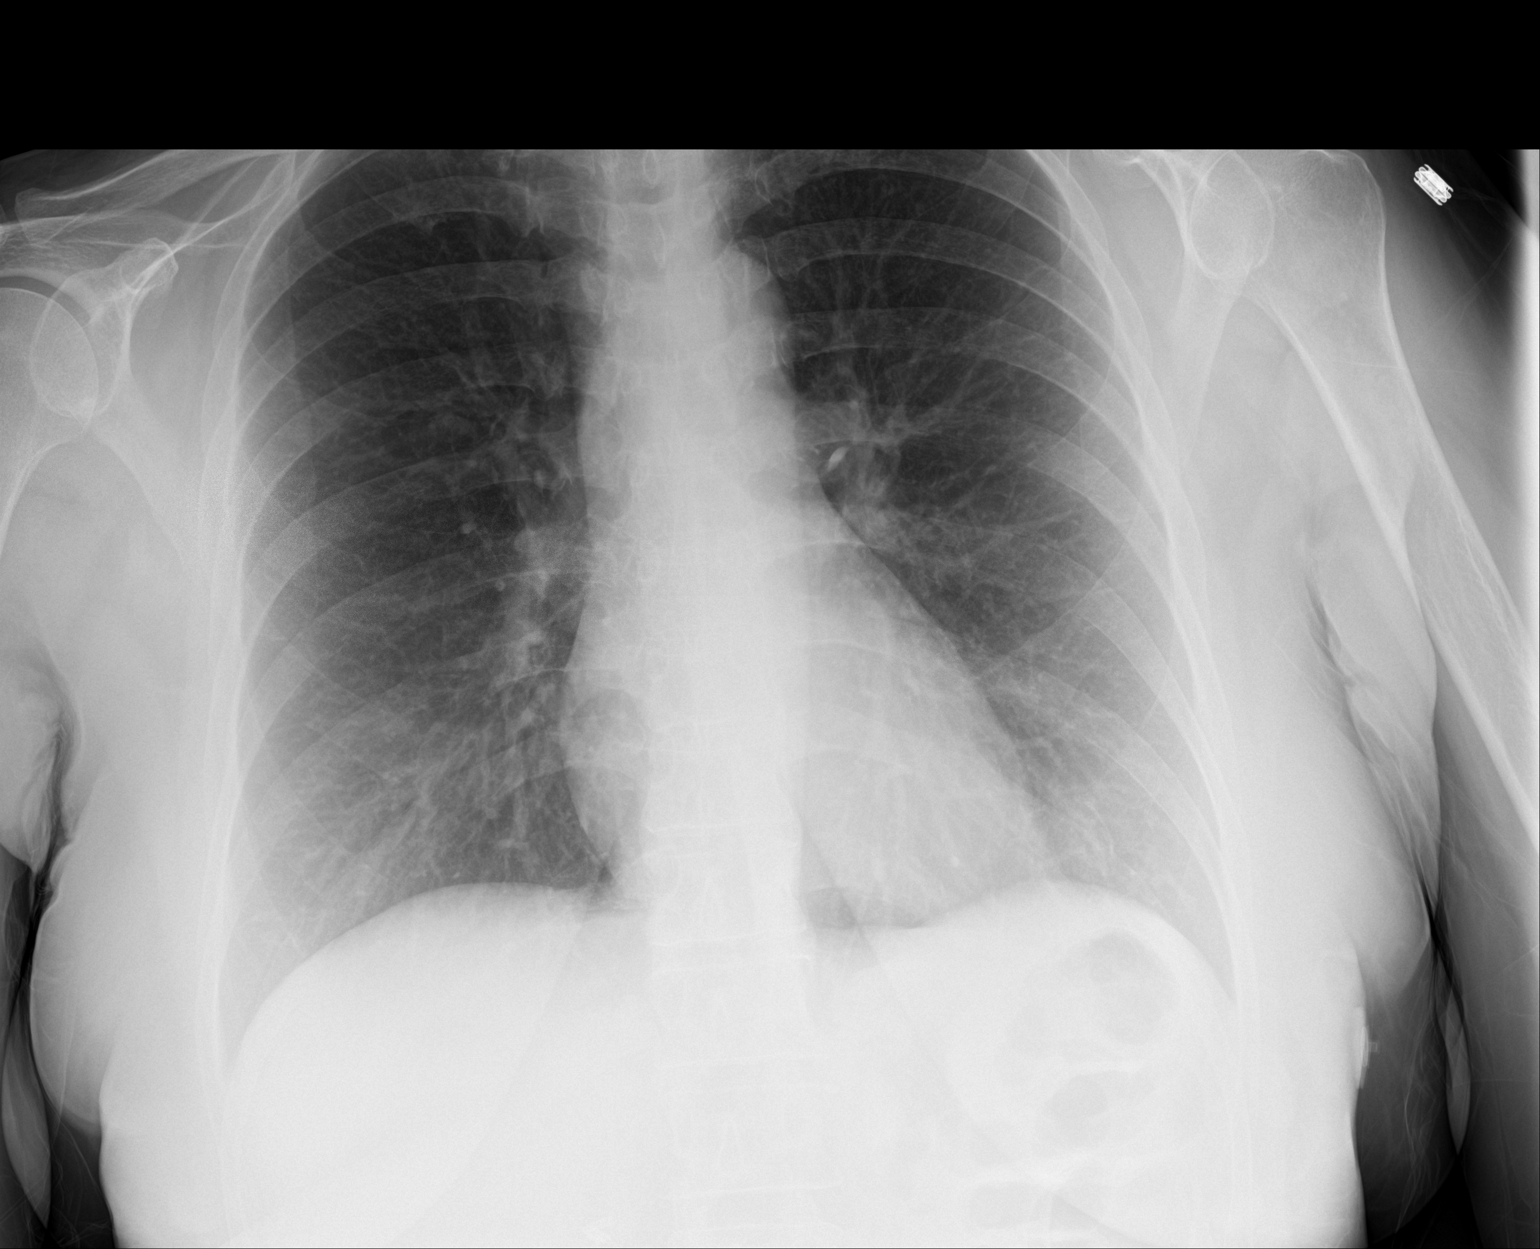

[2 of 2 positions shown; findings below may reference images not displayed]

FINDINGS: The heart size and mediastinal contours are within normal limits.
Both lungs are clear. No pleural effusion or pneumothorax. Bony
thorax is intact.
IMPRESSION: No active cardiopulmonary disease.

## 2016-02-04 ENCOUNTER — Emergency Department (HOSPITAL_COMMUNITY): Payer: Medicare Other

## 2016-02-04 ENCOUNTER — Emergency Department (HOSPITAL_COMMUNITY)
Admission: EM | Admit: 2016-02-04 | Discharge: 2016-02-04 | Disposition: A | Discharge status: Home / Self Care | Payer: Medicare Other | Attending: Emergency Medicine | Admitting: Emergency Medicine

## 2016-02-04 ENCOUNTER — Encounter (HOSPITAL_COMMUNITY): Payer: Self-pay | Admitting: Emergency Medicine

## 2016-02-04 DIAGNOSIS — E039 Hypothyroidism, unspecified: Secondary | ICD-10-CM | POA: Insufficient documentation

## 2016-02-04 DIAGNOSIS — Y9289 Other specified places as the place of occurrence of the external cause: Secondary | ICD-10-CM | POA: Insufficient documentation

## 2016-02-04 DIAGNOSIS — R197 Diarrhea, unspecified: Secondary | ICD-10-CM | POA: Insufficient documentation

## 2016-02-04 DIAGNOSIS — R1010 Upper abdominal pain, unspecified: Secondary | ICD-10-CM | POA: Diagnosis not present

## 2016-02-04 DIAGNOSIS — G8929 Other chronic pain: Secondary | ICD-10-CM | POA: Insufficient documentation

## 2016-02-04 DIAGNOSIS — Z880 Allergy status to penicillin: Secondary | ICD-10-CM | POA: Insufficient documentation

## 2016-02-04 DIAGNOSIS — Z9049 Acquired absence of other specified parts of digestive tract: Secondary | ICD-10-CM | POA: Insufficient documentation

## 2016-02-04 DIAGNOSIS — Z7982 Long term (current) use of aspirin: Secondary | ICD-10-CM | POA: Insufficient documentation

## 2016-02-04 DIAGNOSIS — R1012 Left upper quadrant pain: Secondary | ICD-10-CM | POA: Insufficient documentation

## 2016-02-04 DIAGNOSIS — I10 Essential (primary) hypertension: Secondary | ICD-10-CM | POA: Insufficient documentation

## 2016-02-04 DIAGNOSIS — I509 Heart failure, unspecified: Secondary | ICD-10-CM | POA: Diagnosis not present

## 2016-02-04 DIAGNOSIS — T148 Other injury of unspecified body region: Secondary | ICD-10-CM | POA: Insufficient documentation

## 2016-02-04 DIAGNOSIS — Z7952 Long term (current) use of systemic steroids: Secondary | ICD-10-CM | POA: Diagnosis not present

## 2016-02-04 DIAGNOSIS — F419 Anxiety disorder, unspecified: Secondary | ICD-10-CM | POA: Diagnosis not present

## 2016-02-04 DIAGNOSIS — Y998 Other external cause status: Secondary | ICD-10-CM | POA: Insufficient documentation

## 2016-02-04 DIAGNOSIS — Z9889 Other specified postprocedural states: Secondary | ICD-10-CM | POA: Insufficient documentation

## 2016-02-04 DIAGNOSIS — F17210 Nicotine dependence, cigarettes, uncomplicated: Secondary | ICD-10-CM | POA: Insufficient documentation

## 2016-02-04 DIAGNOSIS — Z8719 Personal history of other diseases of the digestive system: Secondary | ICD-10-CM | POA: Insufficient documentation

## 2016-02-04 DIAGNOSIS — Z79899 Other long term (current) drug therapy: Secondary | ICD-10-CM | POA: Diagnosis not present

## 2016-02-04 DIAGNOSIS — W57XXXA Bitten or stung by nonvenomous insect and other nonvenomous arthropods, initial encounter: Secondary | ICD-10-CM | POA: Diagnosis not present

## 2016-02-04 DIAGNOSIS — R109 Unspecified abdominal pain: Secondary | ICD-10-CM

## 2016-02-04 DIAGNOSIS — R112 Nausea with vomiting, unspecified: Secondary | ICD-10-CM | POA: Insufficient documentation

## 2016-02-04 DIAGNOSIS — S30861A Insect bite (nonvenomous) of abdominal wall, initial encounter: Secondary | ICD-10-CM | POA: Diagnosis not present

## 2016-02-04 DIAGNOSIS — Y9389 Activity, other specified: Secondary | ICD-10-CM | POA: Diagnosis not present

## 2016-02-04 DIAGNOSIS — I2510 Atherosclerotic heart disease of native coronary artery without angina pectoris: Secondary | ICD-10-CM | POA: Insufficient documentation

## 2016-02-04 LAB — CBC
HEMATOCRIT: 46 % (ref 36.0–46.0)
HEMOGLOBIN: 15.6 g/dL — AB (ref 12.0–15.0)
MCH: 28.5 pg (ref 26.0–34.0)
MCHC: 33.9 g/dL (ref 30.0–36.0)
MCV: 84.1 fL (ref 78.0–100.0)
Platelets: 429 10*3/uL — ABNORMAL HIGH (ref 150–400)
RBC: 5.47 MIL/uL — ABNORMAL HIGH (ref 3.87–5.11)
RDW: 14.4 % (ref 11.5–15.5)
WBC: 19.7 10*3/uL — ABNORMAL HIGH (ref 4.0–10.5)

## 2016-02-04 LAB — COMPREHENSIVE METABOLIC PANEL
ALBUMIN: 4 g/dL (ref 3.5–5.0)
ALK PHOS: 99 U/L (ref 38–126)
ALT: 12 U/L — ABNORMAL LOW (ref 14–54)
ANION GAP: 8 (ref 5–15)
AST: 16 U/L (ref 15–41)
BUN: 7 mg/dL (ref 6–20)
CALCIUM: 9.4 mg/dL (ref 8.9–10.3)
CO2: 25 mmol/L (ref 22–32)
Chloride: 106 mmol/L (ref 101–111)
Creatinine, Ser: 1.04 mg/dL — ABNORMAL HIGH (ref 0.44–1.00)
GFR calc Af Amer: >60 mL/min (ref >60)
GFR calc non Af Amer: >60 mL/min (ref >60)
GLUCOSE: 120 mg/dL — AB (ref 65–99)
POTASSIUM: 3.8 mmol/L (ref 3.5–5.1)
SODIUM: 139 mmol/L (ref 135–145)
Total Bilirubin: 0.8 mg/dL (ref 0.3–1.2)
Total Protein: 7.8 g/dL (ref 6.5–8.1)

## 2016-02-04 LAB — LIPASE, BLOOD: Lipase: 28 U/L (ref 11–51)

## 2016-02-04 LAB — URINE MICROSCOPIC-ADD ON

## 2016-02-04 LAB — URINALYSIS, ROUTINE W REFLEX MICROSCOPIC
Bilirubin Urine: NEGATIVE
Glucose, UA: NEGATIVE mg/dL
HGB URINE DIPSTICK: NEGATIVE
Ketones, ur: NEGATIVE mg/dL
Nitrite: NEGATIVE
PH: 6 (ref 5.0–8.0)
Protein, ur: NEGATIVE mg/dL
SPECIFIC GRAVITY, URINE: 1.02 (ref 1.005–1.030)

## 2016-02-04 IMAGING — CR DG CERVICAL SPINE COMPLETE 4+V
6 series · 6 of 6 positions shown · non-contrast
Comparison: CT cervical spine 02/03/2013

CLINICAL DATA: Pain down the left side of the neck for 2 days. No
trauma.

EXAM:
CERVICAL SPINE  4+ VIEWS

[c-spine lat]
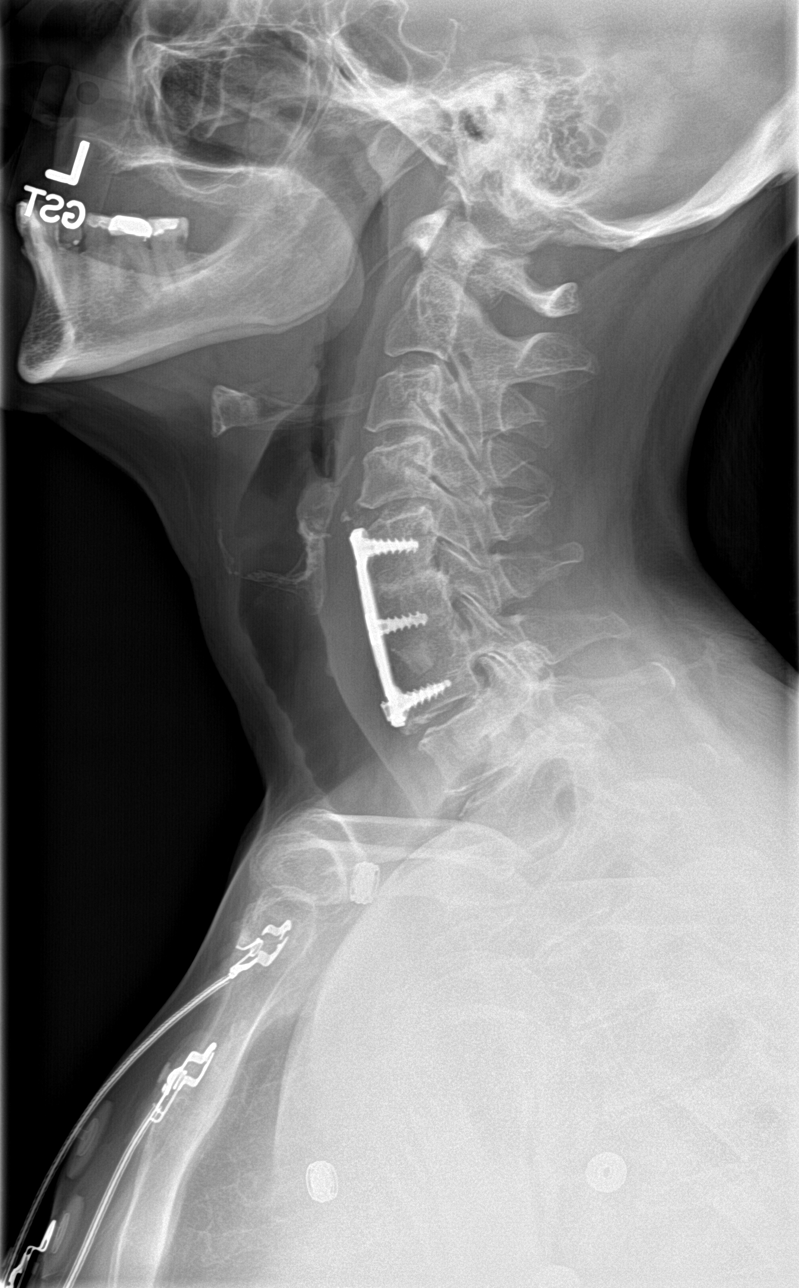

[c-spine obl (1 of 2)]
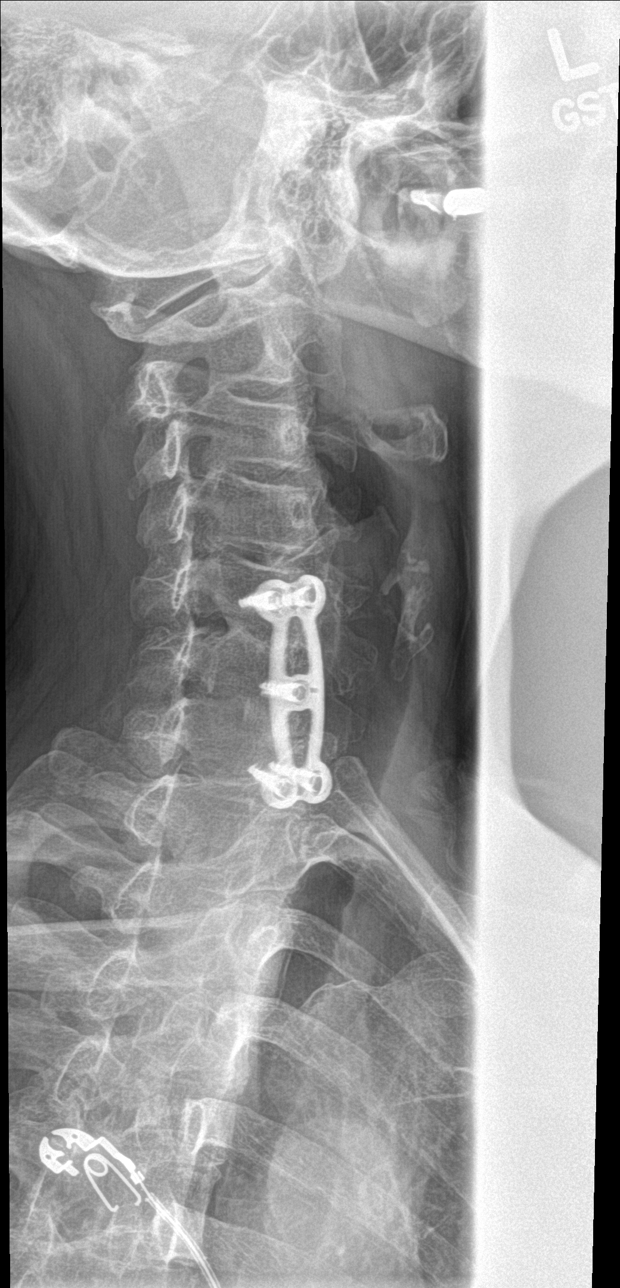

[c-spine obl (2 of 2)]
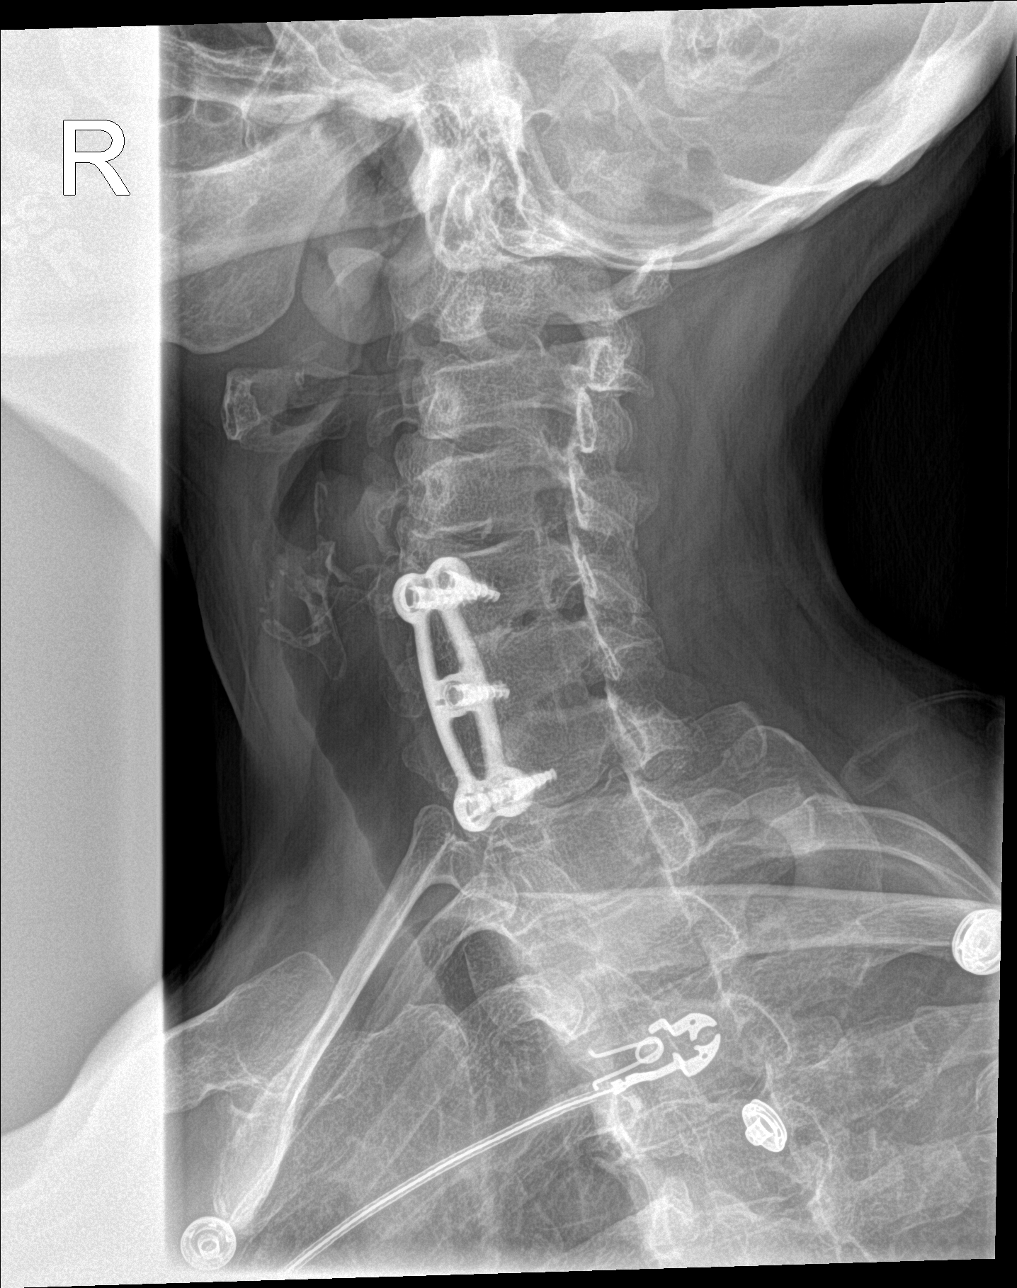

[c-spine ap]
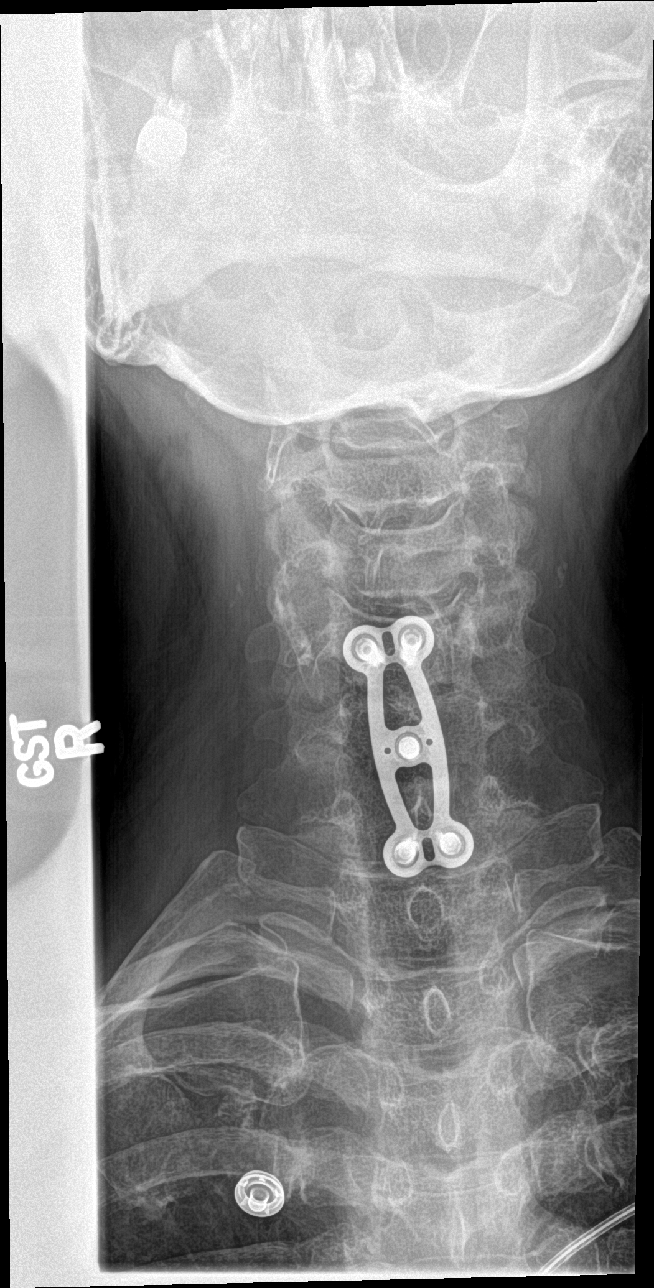

[c-spine open mouth]
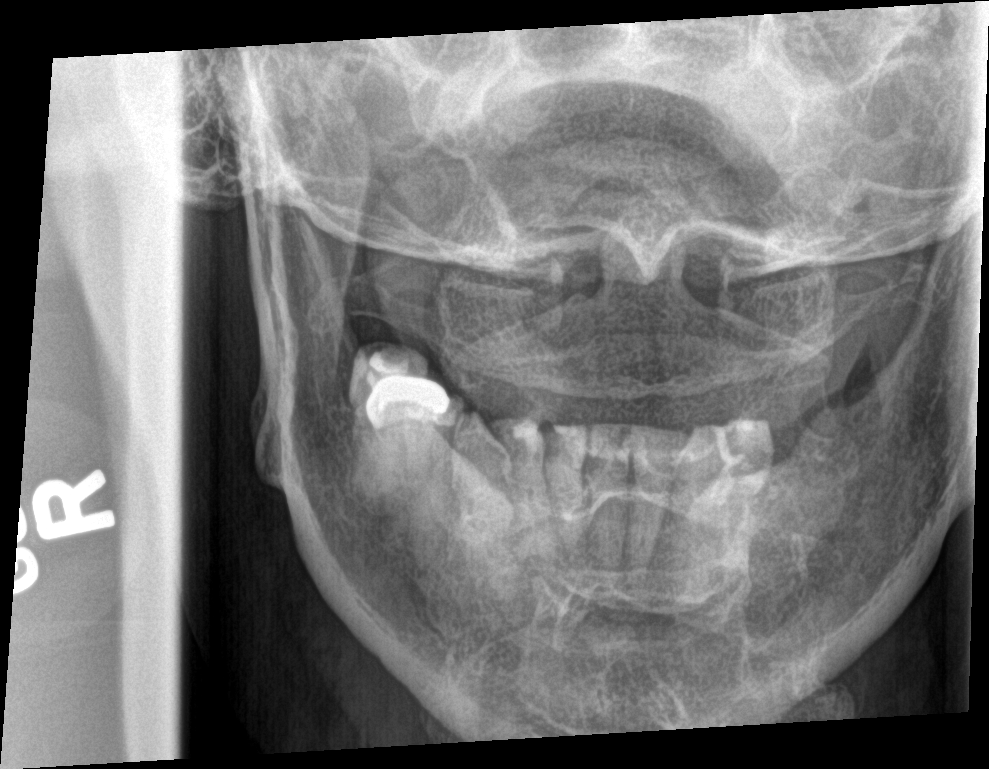

[[person_name]]
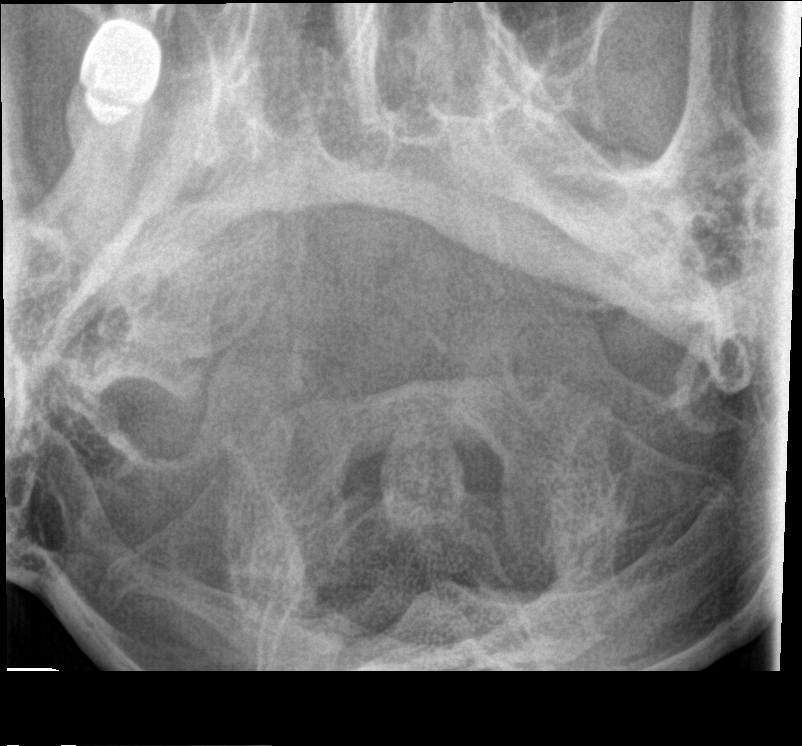

[6 of 6 positions shown; findings below may reference images not displayed]

FINDINGS: Postoperative changes with anterior plate and screw fixation and
intervertebral fusion from C5 through C7. Surgical hardware appears
intact. Distal screw is not flush with the plate but this is
unchanged since prior study. Few segments appear intact. No anterior
subluxation. Normal alignment of the cervical spine. Degenerative
changes mostly at C4-5 and at C7-T1. No vertebral compression
deformities. No prevertebral soft tissue swelling. Normal alignment
of the facet joints. No focal bone destruction.
IMPRESSION: Postoperative changes with anterior fixation and fusion from C5
through C7. Degenerative changes. No acute displaced fractures
identified.

## 2016-02-04 MED ORDER — ONDANSETRON 4 MG PO TBDP
4.0000 mg | ORAL_TABLET | Freq: Once | ORAL | Status: AC | PRN
  Administered 2016-02-04: 4 mg via ORAL

## 2016-02-04 MED ORDER — METOCLOPRAMIDE HCL 5 MG/ML IJ SOLN
10.0000 mg | Freq: Once | INTRAMUSCULAR | Status: AC
  Administered 2016-02-04: 10 mg via INTRAVENOUS
  Filled 2016-02-04: qty 2

## 2016-02-04 MED ORDER — HYDROMORPHONE HCL 1 MG/ML IJ SOLN
1.0000 mg | Freq: Once | INTRAMUSCULAR | Status: AC
  Administered 2016-02-04: 1 mg via INTRAVENOUS
  Filled 2016-02-04: qty 1

## 2016-02-04 MED ORDER — PANTOPRAZOLE SODIUM 40 MG IV SOLR
40.0000 mg | Freq: Once | INTRAVENOUS | Status: AC
  Administered 2016-02-04: 40 mg via INTRAVENOUS
  Filled 2016-02-04: qty 40

## 2016-02-04 MED ORDER — PANTOPRAZOLE SODIUM 20 MG PO TBEC: 20.0000 mg | DELAYED_RELEASE_TABLET | Freq: Every day | ORAL | Status: DC

## 2016-02-04 MED ORDER — ONDANSETRON 4 MG PO TBDP: 4.0000 mg | ORAL_TABLET | Freq: Three times a day (TID) | ORAL | Status: DC | PRN

## 2016-02-04 MED ORDER — ONDANSETRON 4 MG PO TBDP
ORAL_TABLET | ORAL | Status: AC
  Filled 2016-02-04: qty 1

## 2016-02-04 MED ORDER — IOPAMIDOL (ISOVUE-300) INJECTION 61%
INTRAVENOUS | Status: AC
  Administered 2016-02-04: 100 mL
  Filled 2016-02-04: qty 100

## 2016-02-04 MED ORDER — SODIUM CHLORIDE 0.9 % IV BOLUS (SEPSIS)
1000.0000 mL | Freq: Once | INTRAVENOUS | Status: AC
  Administered 2016-02-04: 1000 mL via INTRAVENOUS

## 2016-02-04 NOTE — ED Notes (Signed)
Attempted IV x 2.  [name], Agricultural consultant will attempt [place] IV.

## 2016-02-04 NOTE — ED Notes (Signed)
Pt family given one soda

## 2016-02-04 NOTE — ED Provider Notes (Signed)
CSN: [id number]     Arrival date & time 02/04/16  1231 History   First MD Initiated Contact with Patient 02/04/16 1706     Chief Complaint  Patient presents with  . Emesis    (Consider location/radiation/quality/duration/timing/severity/associated sxs/prior Treatment) Patient is a 50 y.o. female presenting with vomiting. The history is provided by the patient and medical records. No language interpreter was used.  Emesis Associated symptoms: abdominal pain and diarrhea   Associated symptoms: no chills and no headaches    Krishika TYLAYA MONTECALVO is a 50 y.o. female  with a PMH of gastroparesis, addison's, HTN, HLD who presents to the Emergency Department complaining of upper and left sided abdominal pain that began last night. Associated symptoms include 12 episodes of emesis and 4 episodes of nonbloody diarrhea. Patient states symptoms are c/w her typical gastroparesis flares. No alleviating or aggravating factors noted. States she has been unable to tolerate PO, therefore no meds taken for symptoms.    Patient is also complaining of tick bite - patient removed tick approximately one week ago and has small amount of erythema at site of insertion. No pain, just itching. Denies fever, headache, mental status changes, arthralgias, myalgias.    Past Medical History  Diagnosis Date  . Cardiomyopathy     resolved  . Chest pain     chronicc  . Hyperlipidemia   . HTN (hypertension)   . Hypothyroidism   . Adrenal insufficiency ([place])     diagnosed 2012  . Anxiety   . Nondiabetic gastroparesis   . CAD (coronary artery disease)     Cath 2008 EF normal. RCA 50-60, Septal 50%. Myoview 3/12: EF 53% normal perfusion  . Chronic back pain   . QT prolongation   . Mitral valve prolapse   . Gastroparesis   . Chronic diarrhea   . Astigmatism   . Concussion     sept 28th 2014  . CHF (congestive heart failure) ([place])   . Cardiac arrest ([place])     2/2 adissonian crisis  . Tobacco abuse     down to 2  cigarettes per day   Past Surgical History  Procedure Laterality Date  . Cholecystectomy    . Spine surgery    . Vesicovaginal fistula closure w/ tah    . Varicose vein surgery    . Abdominal hysterectomy    . Cardiac catheterization  03/2007    showed 60% lesion in the right coronary artery  . Left heart catheterization with coronary angiogram N/A 04/13/2012    Procedure: LEFT HEART CATHETERIZATION WITH CORONARY ANGIOGRAM;  Surgeon: Hillary Bow, MD;  Location: [facility] CATH LAB;  Service: Cardiovascular;  Laterality: N/A;   Family History  Problem Relation Age of Onset  . Coronary artery disease    . Heart attack Mother   . Cancer Father     Colon   Social History  Substance Use Topics  . Smoking status: Current Every Day Smoker -- 0.50 packs/day for 10 years    Types: Cigarettes  . Smokeless tobacco: Never Used     Comment: smokes 6-7 cigarettes per day  . Alcohol Use: No   OB History    Gravida Para Term Preterm AB TAB SAB Ectopic Multiple Living   3 2 2  0 1  1   2      Review of Systems  Constitutional: Negative for fever and chills.  HENT: Negative for congestion.   Eyes: Negative for visual disturbance.  Respiratory: Negative for cough and  shortness of breath.   Cardiovascular: Negative.   Gastrointestinal: Positive for nausea, vomiting, abdominal pain and diarrhea. Negative for blood in stool.  Genitourinary: Negative for dysuria.  Musculoskeletal: Negative for back pain.  Neurological: Negative for dizziness and headaches.  Hematological: Does not bruise/bleed easily.      Allergies  Bee venom; Doxycycline; Erythromycin; Ibuprofen; Morphine and related; Penicillins; Sulfa antibiotics; Cephalexin; Cephalexin; Dust mite extract; Morphine; Tramadol; Other; and Sulfonamide derivatives  Home Medications   Prior to Admission medications   Medication Sig Start Date End Date Taking? Authorizing Provider  amLODipine (NORVASC) 5 MG tablet TAKE 1 TABLET (5 MG TOTAL)  BY MOUTH DAILY. 07/25/15  Yes Pixie Casino, MD  aspirin 81 MG chewable tablet Chew 81 mg by mouth daily.    Yes Historical Provider, MD  cholecalciferol (VITAMIN D) 1000 UNITS tablet Take 1,000 Units by mouth daily.   Yes Historical Provider, MD  cyclobenzaprine (FLEXERIL) 10 MG tablet Take 10 mg by mouth 3 (three) times daily as needed for muscle spasms.    Yes Historical Provider, MD  hydrochlorothiazide (HYDRODIURIL) 25 MG tablet Take 12.5 mg by mouth 2 (two) times daily as needed (Blood pressure rises).    Yes Historical Provider, MD  hydrocortisone (CORTEF) 10 MG tablet Take 10-20 mg by mouth 2 (two) times daily. Pt takes 20 mg in the morning and 10 mg at night   Yes Historical Provider, MD  levothyroxine (SYNTHROID, LEVOTHROID) 75 MCG tablet Take 75 mcg by mouth daily before breakfast.   Yes Historical Provider, MD  LORazepam (ATIVAN) 2 MG tablet Take 2 mg by mouth every 6 (six) hours as needed for anxiety.   Yes Historical Provider, MD  meclizine (ANTIVERT) 25 MG tablet Take 1 tablet (25 mg total) by mouth 4 (four) times daily as needed for dizziness. 06/15/13  Yes Rolland Porter, MD  metoCLOPramide (REGLAN) 10 MG tablet Take 10 mg by mouth 3 (three) times daily. Runny stools   Yes Historical Provider, MD  nitroGLYCERIN (NITROSTAT) 0.4 MG SL tablet Place 0.4 mg under the tongue every 5 (five) minutes as needed for chest pain.   Yes Historical Provider, MD  oxycodone (ROXICODONE) 30 MG immediate release tablet Take 30 mg by mouth every 6 (six) hours as needed for pain.    Yes Historical Provider, MD  pramipexole (MIRAPEX) 0.125 MG tablet Take 0.125 mg by mouth at bedtime as needed (restles leg syndrome).   Yes Historical Provider, MD  vitamin C (ASCORBIC ACID) 500 MG tablet Take 500 mg by mouth daily.   Yes Historical Provider, MD  zolpidem (AMBIEN) 10 MG tablet Take 10 mg by mouth at bedtime.  03/20/14  Yes Historical Provider, MD  ondansetron (ZOFRAN ODT) 4 MG disintegrating tablet Take 1 tablet  (4 mg total) by mouth every 8 (eight) hours as needed for nausea or vomiting. 02/04/16   Ozella Almond Ward, PA-C  pantoprazole (PROTONIX) 20 MG tablet Take 1 tablet (20 mg total) by mouth daily. 02/04/16   Jaime Pilcher Ward, PA-C   BP 117/83 mmHg  Pulse 61  Temp(Src) 98.5 F (36.9 C) (Oral)  Resp 15  SpO2 93% Physical Exam  Constitutional: She is oriented to person, place, and time. She appears well-developed and well-nourished.  Alert and in no acute distress  HENT:  Head: Normocephalic and atraumatic.  Cardiovascular: Normal rate, regular rhythm and normal heart sounds.  Exam reveals no gallop and no friction rub.   No murmur heard. Pulmonary/Chest: Effort normal and breath sounds normal.  No respiratory distress.  Abdominal: Soft. Bowel sounds are normal. She exhibits no distension and no mass. There is tenderness. There is no rebound and no guarding.    Musculoskeletal: Normal range of motion.  Neurological: She is alert and oriented to person, place, and time.  Skin: Skin is warm and dry.  2cm area of erythema which is non-tender and not warm to the touch where patient reports pulling tick from skin.   Nursing note and vitals reviewed.   ED Course  Procedures (including critical care time) Labs Review Labs Reviewed  COMPREHENSIVE METABOLIC PANEL - Abnormal; Notable for the following:    Glucose, Bld 120 (*)    Creatinine, Ser 1.04 (*)    ALT 12 (*)    All other components within normal limits  CBC - Abnormal; Notable for the following:    WBC 19.7 (*)    RBC 5.47 (*)    Hemoglobin 15.6 (*)    Platelets 429 (*)    All other components within normal limits  URINALYSIS, ROUTINE W REFLEX MICROSCOPIC (NOT AT [facility]) - Abnormal; Notable for the following:    Leukocytes, UA SMALL (*)    All other components within normal limits  URINE MICROSCOPIC-ADD ON - Abnormal; Notable for the following:    Squamous Epithelial / LPF 6-30 (*)    Bacteria, UA FEW (*)    All other  components within normal limits  LIPASE, BLOOD  ROCKY MTN SPOTTED FVR ABS PNL(IGG+IGM)  LYME DISEASE DNA BY PCR(BORRELIA BURG)    Imaging Review Ct Abdomen Pelvis W Contrast  02/04/2016  CLINICAL DATA:  Upper abdominal pain with nausea and vomiting. EXAM: CT ABDOMEN AND PELVIS WITH CONTRAST TECHNIQUE: Multidetector CT imaging of the abdomen and pelvis was performed using the standard protocol following bolus administration of intravenous contrast. CONTRAST:  100 mL Isovue-300 COMPARISON:  09/19/2015 FINDINGS: Lower chest: Dependent atelectasis at the lung bases. No pleural effusions. Hepatobiliary: Post cholecystectomy. Stable intrahepatic and extrahepatic biliary dilatation. The distal common bile duct measures 1.0 cm. Portal venous system is patent. No suspicious liver findings. Pancreas: Normal appearance of the pancreas without inflammation or duct dilatation. Spleen: Normal appearance of spleen without enlargement. Adrenals/Urinary Tract: Normal adrenal glands. Normal kidneys. Normal urinary bladder. Stomach/Bowel: The cecum is located in the right hemipelvis and normal appearance of the appendix. No evidence for bowel obstruction. Normal appearance of stomach and small bowel. Vascular/Lymphatic: Mild atherosclerotic disease in the aorta without aneurysm. No suspicious lymphadenopathy in the abdomen or pelvis. Reproductive: Uterus has been removed. Evidence for normal ovarian tissue bilaterally. Other: No free fluid.  No free air. Musculoskeletal: Stable area of sclerosis involving the left superior pubic ramus. No acute bone abnormality. Degenerative facet arthropathy in the lumbar spine. IMPRESSION: No acute abnormality in the abdomen or pelvis. Stable biliary dilatation, post cholecystectomy. Electronically Signed   By: Markus Daft M.D.   On: 02/04/2016 20:53   I have personally reviewed and evaluated these images and lab results as part of my medical decision-making.   EKG  Interpretation   Date/Time:  Wednesday Feb 04 2016 18:49:05 EDT Ventricular Rate:  79 PR Interval:  136 QRS Duration: 101 QT Interval:  389 QTC Calculation: 446 R Axis:   87 Text Interpretation:  Sinus rhythm Consider left atrial enlargement  Baseline wander in lead(s) III V3 Confirmed by HAVILAND MD, JULIE ([id number])  on 02/04/2016 6:55:01 PM      MDM   Final diagnoses:  Abdominal pain  Tick bite  KHAYLA VORBECK presents to ED for n/v/d which she states is c/w her typical gastroparesis flares.   CBC with white count of 19.7 CMP with Cr of 1.04 Lipase wdl UA reviewed.   CT abd/pelvis with no acute abnormalities.   Therapeutics: 1L fluids, reglan, protonix, pain control.   A&P: Abdominal pain, n/v  - PCP follow up  - Protonix, zofran Tick bite  - Patient allergic to doxycyline, insertion site does not appear infected at this time. Will obtain titers.   Patient discussed with Dr. Gilford Raid who agrees with treatment plan.    [facility] Ward, PA-C 02/04/16 2210  Isla Pence, MD 02/04/16 [phone]

## 2016-02-04 NOTE — Discharge Instructions (Signed)
Follow up with your primary care provider in regards to today's visit.  °Return to ER for new or worsening symptoms, any additional concerns.  °

## 2016-02-04 NOTE — ED Notes (Signed)
Pt's daughter at bedside requesting happy meal and drink.  States she ate this morning and doesn't get paid until tomorrow.  Happy meal and soda given to daughter.

## 2016-02-04 NOTE — ED Notes (Signed)
Pt sts upper abd pain and N/V starting last night that is similar to gastroparesis in past

## 2016-02-05 LAB — URINE CULTURE

## 2016-02-10 LAB — ROCKY MTN SPOTTED FVR ABS PNL(IGG+IGM)
RMSF IGG: NEGATIVE
RMSF IgM: 0.46 index (ref 0.00–0.89)

## 2016-02-18 IMAGING — MG MM SCREENING BREAST TOMO BILATERAL
8 series · 8 of 24 positions shown · non-contrast
Comparison: Previous exam(s).

CLINICAL DATA: Screening.

EXAM:
DIGITAL SCREENING BILATERAL MAMMOGRAM WITH 3D TOMO WITH CAD

[L MLO]
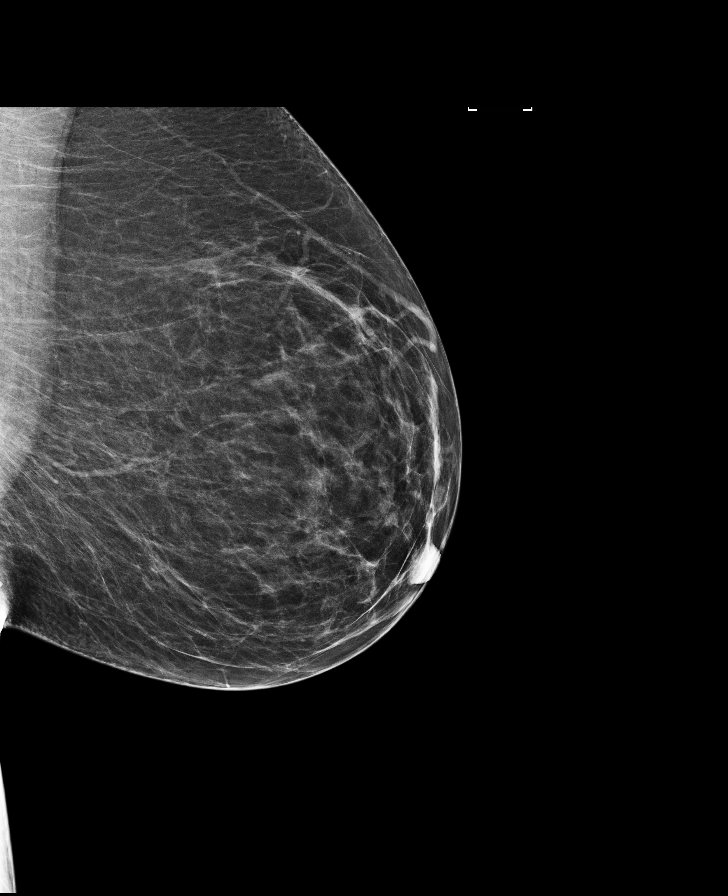

[L CC]
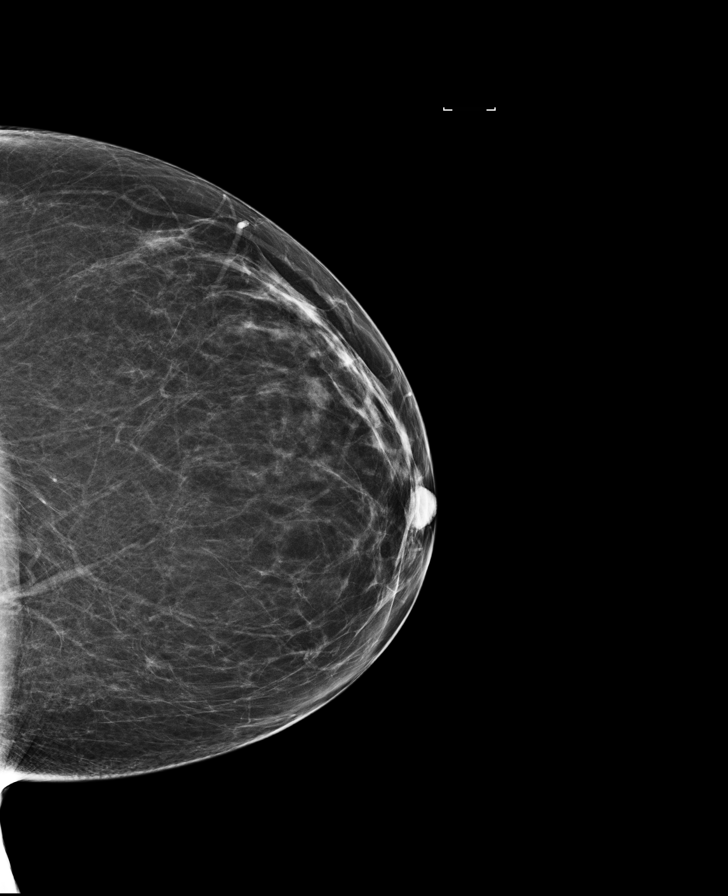

[R CC]
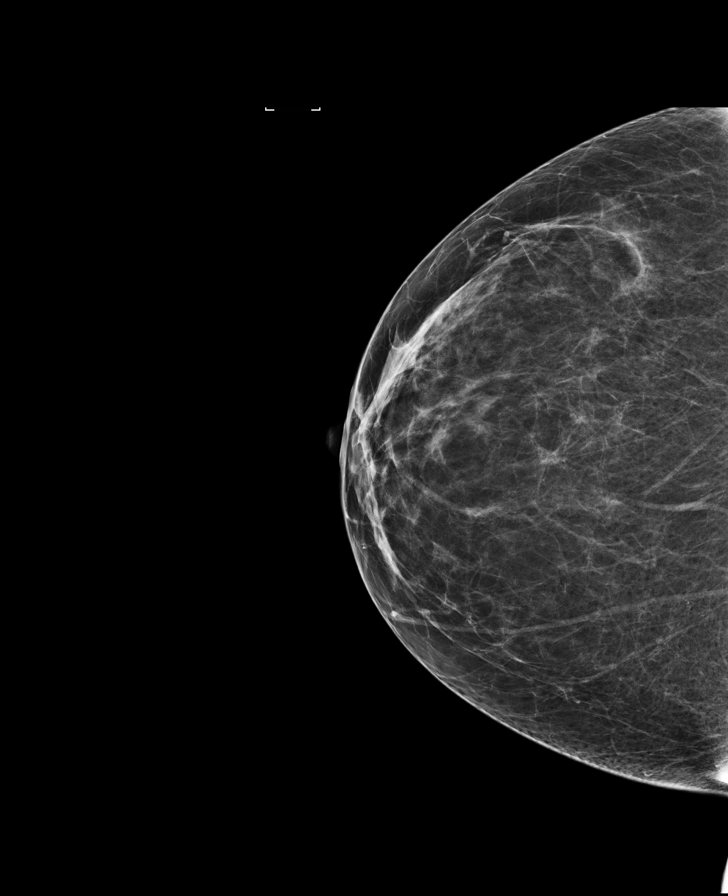

[R MLO]
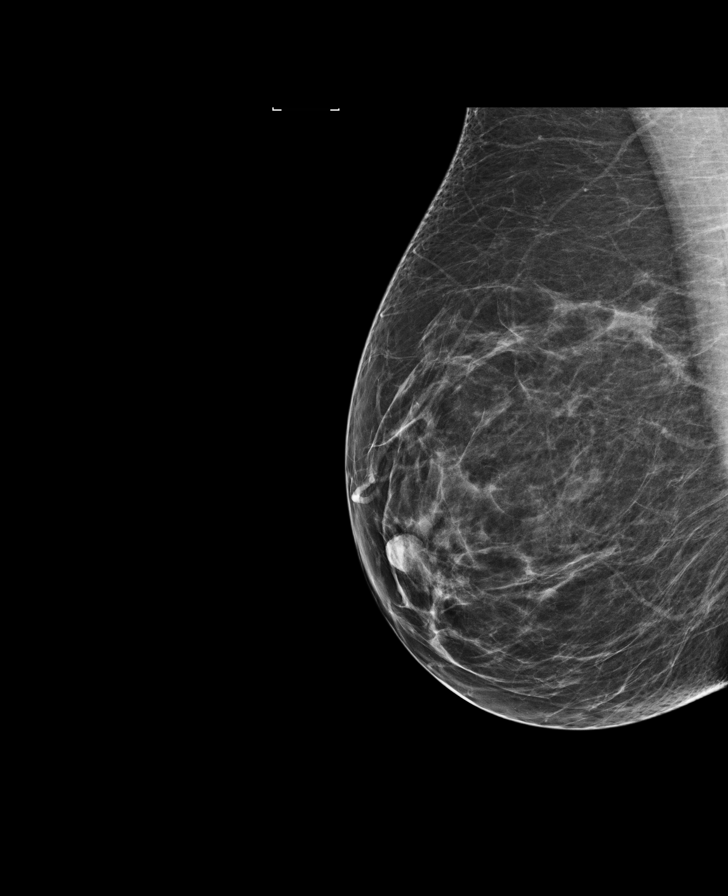

[L MLO tomo · tomo slice 35/69.0]
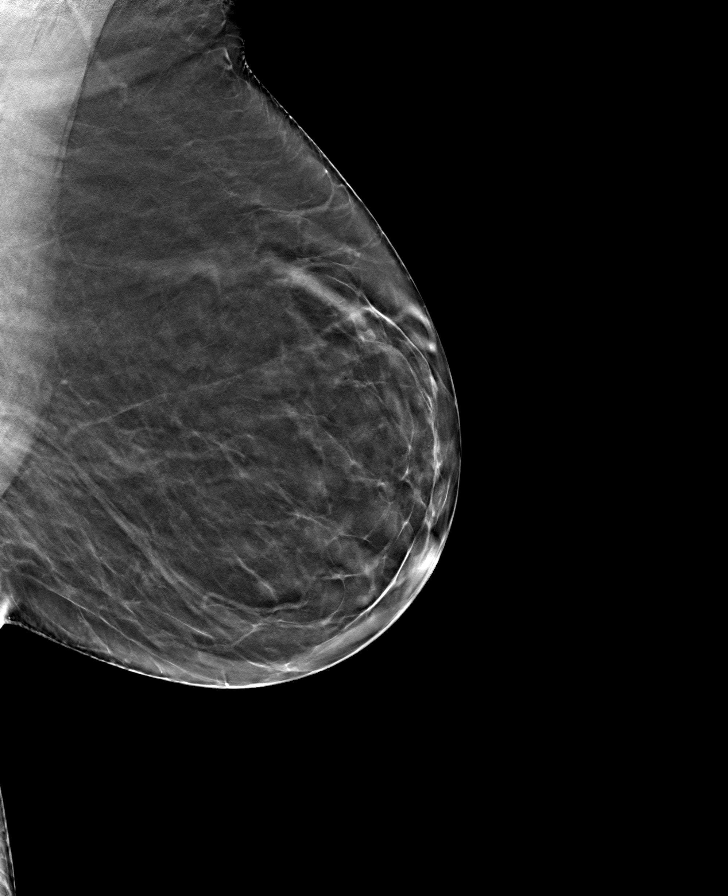

[R MLO tomo · tomo slice 33/66.0]
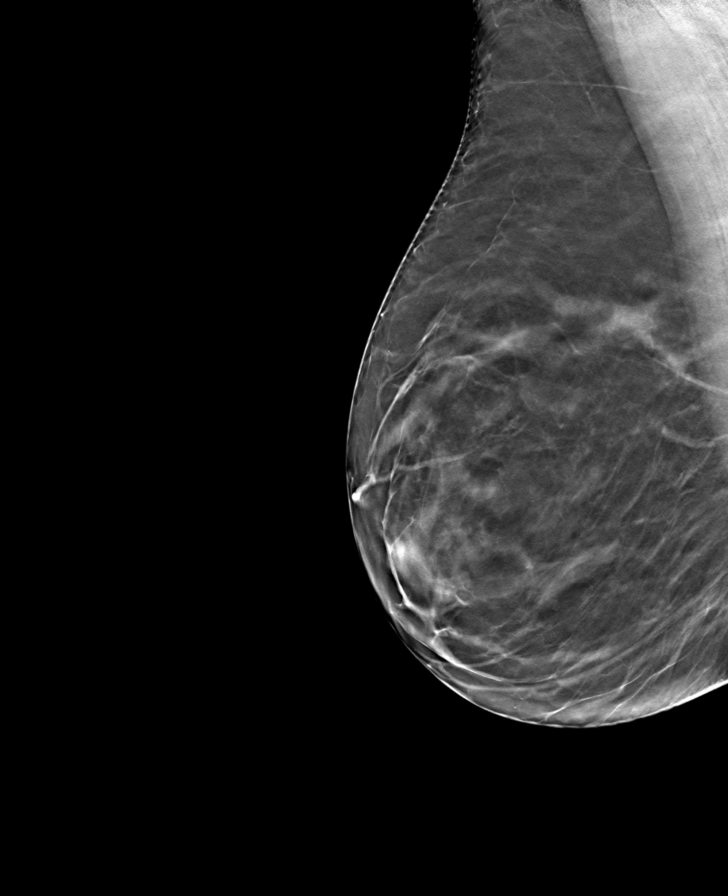

[L CC tomo · tomo slice 35/68.0]
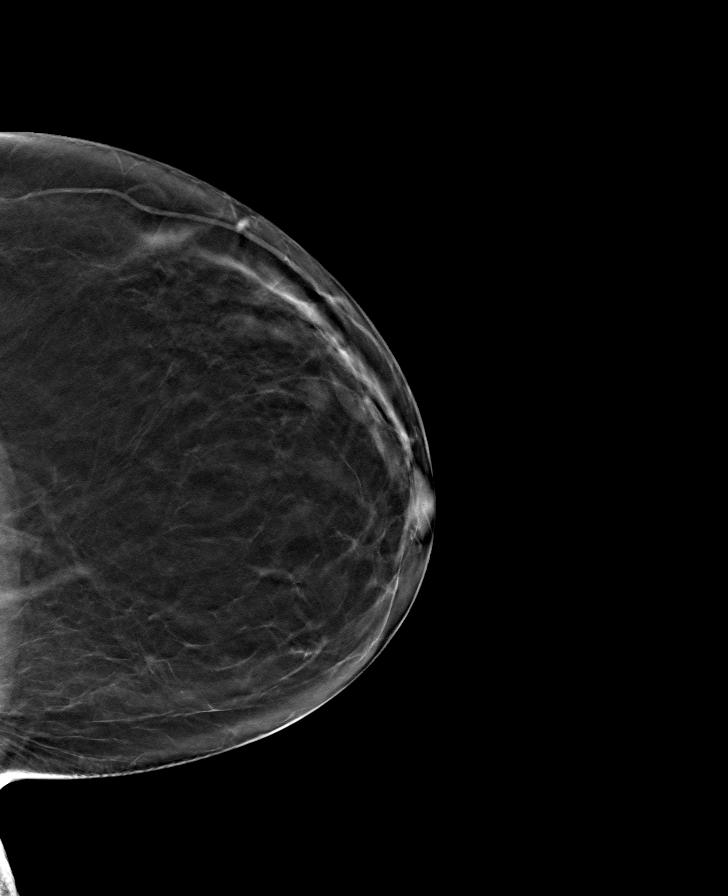

[R CC tomo · tomo slice 30/59.0]
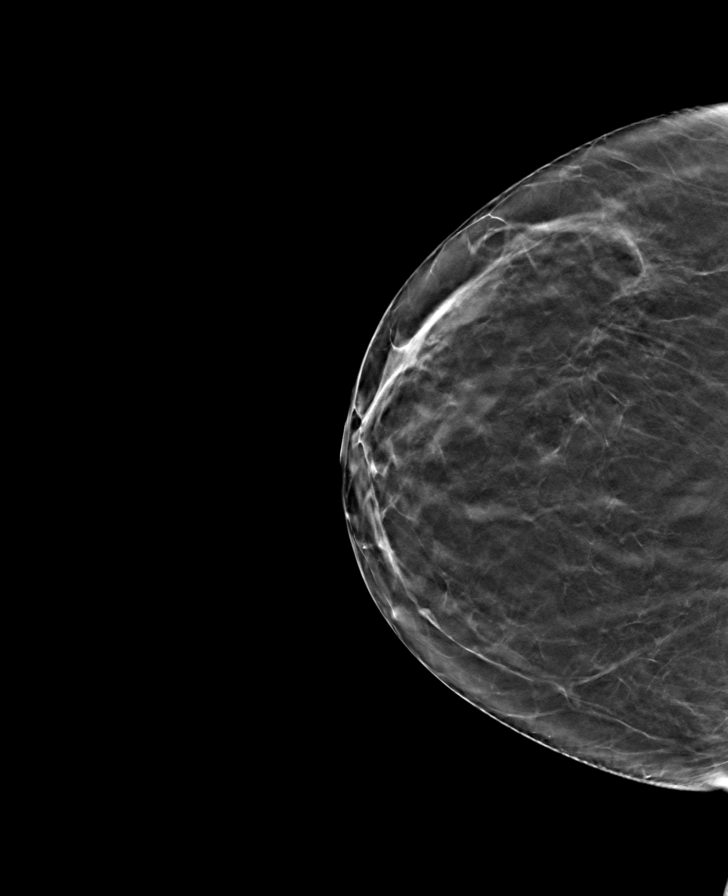

[8 of 24 positions shown; findings below may reference images not displayed]

ACR Breast Density Category b: There are scattered areas of
fibroglandular density.
FINDINGS: There are no findings suspicious for malignancy. Images were
processed with CAD.
IMPRESSION: No mammographic evidence of malignancy. A result letter of this
screening mammogram will be mailed directly to the patient.

RECOMMENDATION:
Screening mammogram in one year. (Code:55-L-23V)

BI-RADS CATEGORY  1: Negative.

## 2016-04-01 ENCOUNTER — Emergency Department (HOSPITAL_COMMUNITY): Payer: Medicare Other

## 2016-04-01 ENCOUNTER — Encounter (HOSPITAL_COMMUNITY): Payer: Self-pay | Admitting: Emergency Medicine

## 2016-04-01 ENCOUNTER — Emergency Department (HOSPITAL_COMMUNITY)
Admission: EM | Admit: 2016-04-01 | Discharge: 2016-04-01 | Disposition: A | Discharge status: Home / Self Care | Payer: Medicare Other | Attending: Emergency Medicine | Admitting: Emergency Medicine

## 2016-04-01 DIAGNOSIS — M47812 Spondylosis without myelopathy or radiculopathy, cervical region: Secondary | ICD-10-CM

## 2016-04-01 DIAGNOSIS — Z6829 Body mass index (BMI) 29.0-29.9, adult: Secondary | ICD-10-CM | POA: Diagnosis not present

## 2016-04-01 DIAGNOSIS — M5030 Other cervical disc degeneration, unspecified cervical region: Secondary | ICD-10-CM | POA: Diagnosis not present

## 2016-04-01 DIAGNOSIS — I671 Cerebral aneurysm, nonruptured: Secondary | ICD-10-CM | POA: Insufficient documentation

## 2016-04-01 DIAGNOSIS — M5382 Other specified dorsopathies, cervical region: Secondary | ICD-10-CM | POA: Diagnosis not present

## 2016-04-01 DIAGNOSIS — R519 Headache, unspecified: Secondary | ICD-10-CM

## 2016-04-01 DIAGNOSIS — R51 Headache: Secondary | ICD-10-CM | POA: Insufficient documentation

## 2016-04-01 DIAGNOSIS — F17210 Nicotine dependence, cigarettes, uncomplicated: Secondary | ICD-10-CM | POA: Insufficient documentation

## 2016-04-01 DIAGNOSIS — Z79899 Other long term (current) drug therapy: Secondary | ICD-10-CM | POA: Insufficient documentation

## 2016-04-01 DIAGNOSIS — M50121 Cervical disc disorder at C4-C5 level with radiculopathy: Secondary | ICD-10-CM | POA: Diagnosis not present

## 2016-04-01 DIAGNOSIS — M5489 Other dorsalgia: Secondary | ICD-10-CM | POA: Diagnosis not present

## 2016-04-01 DIAGNOSIS — M5011 Cervical disc disorder with radiculopathy,  high cervical region: Secondary | ICD-10-CM | POA: Diagnosis not present

## 2016-04-01 DIAGNOSIS — R52 Pain, unspecified: Secondary | ICD-10-CM | POA: Diagnosis not present

## 2016-04-01 DIAGNOSIS — M5090 Cervical disc disorder, unspecified, unspecified cervical region: Secondary | ICD-10-CM

## 2016-04-01 DIAGNOSIS — E039 Hypothyroidism, unspecified: Secondary | ICD-10-CM | POA: Insufficient documentation

## 2016-04-01 DIAGNOSIS — I509 Heart failure, unspecified: Secondary | ICD-10-CM | POA: Insufficient documentation

## 2016-04-01 DIAGNOSIS — E785 Hyperlipidemia, unspecified: Secondary | ICD-10-CM | POA: Diagnosis not present

## 2016-04-01 DIAGNOSIS — Z7982 Long term (current) use of aspirin: Secondary | ICD-10-CM | POA: Insufficient documentation

## 2016-04-01 DIAGNOSIS — I110 Hypertensive heart disease with heart failure: Secondary | ICD-10-CM | POA: Insufficient documentation

## 2016-04-01 DIAGNOSIS — I2510 Atherosclerotic heart disease of native coronary artery without angina pectoris: Secondary | ICD-10-CM | POA: Diagnosis not present

## 2016-04-01 DIAGNOSIS — R42 Dizziness and giddiness: Secondary | ICD-10-CM | POA: Diagnosis not present

## 2016-04-01 DIAGNOSIS — I10 Essential (primary) hypertension: Secondary | ICD-10-CM | POA: Diagnosis not present

## 2016-04-01 DIAGNOSIS — I720 Aneurysm of carotid artery: Secondary | ICD-10-CM | POA: Diagnosis not present

## 2016-04-01 DIAGNOSIS — M542 Cervicalgia: Secondary | ICD-10-CM | POA: Diagnosis present

## 2016-04-01 DIAGNOSIS — G450 Vertebro-basilar artery syndrome: Secondary | ICD-10-CM | POA: Diagnosis not present

## 2016-04-01 DIAGNOSIS — M50122 Cervical disc disorder at C5-C6 level with radiculopathy: Secondary | ICD-10-CM | POA: Diagnosis not present

## 2016-04-01 LAB — URINALYSIS, ROUTINE W REFLEX MICROSCOPIC
BILIRUBIN URINE: NEGATIVE
GLUCOSE, UA: NEGATIVE mg/dL
HGB URINE DIPSTICK: NEGATIVE
KETONES UR: NEGATIVE mg/dL
Leukocytes, UA: NEGATIVE
Nitrite: NEGATIVE
PH: 6 (ref 5.0–8.0)
PROTEIN: NEGATIVE mg/dL
Specific Gravity, Urine: <1.005 — ABNORMAL LOW (ref 1.005–1.030)

## 2016-04-01 LAB — CBC WITH DIFFERENTIAL/PLATELET
BASOS ABS: 0.1 10*3/uL (ref 0.0–0.1)
Basophils Relative: 1 %
Eosinophils Absolute: 0.2 10*3/uL (ref 0.0–0.7)
Eosinophils Relative: 2 %
HCT: 39.3 % (ref 36.0–46.0)
Hemoglobin: 13.3 g/dL (ref 12.0–15.0)
LYMPHS PCT: 31 %
Lymphs Abs: 3.7 10*3/uL (ref 0.7–4.0)
MCH: 28.9 pg (ref 26.0–34.0)
MCHC: 33.8 g/dL (ref 30.0–36.0)
MCV: 85.2 fL (ref 78.0–100.0)
MONO ABS: 1.1 10*3/uL — AB (ref 0.1–1.0)
MONOS PCT: 9 %
Neutro Abs: 6.9 10*3/uL (ref 1.7–7.7)
Neutrophils Relative %: 57 %
PLATELETS: 383 10*3/uL (ref 150–400)
RBC: 4.61 MIL/uL (ref 3.87–5.11)
RDW: 14.4 % (ref 11.5–15.5)
WBC: 12.1 10*3/uL — ABNORMAL HIGH (ref 4.0–10.5)

## 2016-04-01 LAB — TROPONIN I

## 2016-04-01 LAB — COMPREHENSIVE METABOLIC PANEL
ALBUMIN: 3.7 g/dL (ref 3.5–5.0)
ALK PHOS: 79 U/L (ref 38–126)
ALT: 13 U/L — ABNORMAL LOW (ref 14–54)
AST: 19 U/L (ref 15–41)
Anion gap: 5 (ref 5–15)
BILIRUBIN TOTAL: 0.4 mg/dL (ref 0.3–1.2)
BUN: 12 mg/dL (ref 6–20)
CO2: 29 mmol/L (ref 22–32)
Calcium: 8.5 mg/dL — ABNORMAL LOW (ref 8.9–10.3)
Chloride: 105 mmol/L (ref 101–111)
Creatinine, Ser: 0.72 mg/dL (ref 0.44–1.00)
GFR calc Af Amer: >60 mL/min (ref >60)
GFR calc non Af Amer: >60 mL/min (ref >60)
GLUCOSE: 94 mg/dL (ref 65–99)
POTASSIUM: 3.1 mmol/L — AB (ref 3.5–5.1)
Sodium: 139 mmol/L (ref 135–145)
TOTAL PROTEIN: 7.2 g/dL (ref 6.5–8.1)

## 2016-04-01 MED ORDER — HYDROMORPHONE HCL 1 MG/ML IJ SOLN
0.5000 mg | Freq: Once | INTRAMUSCULAR | Status: AC
  Administered 2016-04-01: 0.5 mg via INTRAVENOUS
  Filled 2016-04-01: qty 1

## 2016-04-01 MED ORDER — FENTANYL CITRATE (PF) 100 MCG/2ML IJ SOLN
50.0000 ug | Freq: Once | INTRAMUSCULAR | Status: AC
  Administered 2016-04-01: 50 ug via INTRAVENOUS
  Filled 2016-04-01: qty 2

## 2016-04-01 MED ORDER — DIAZEPAM 5 MG/ML IJ SOLN
5.0000 mg | Freq: Once | INTRAMUSCULAR | Status: AC
  Administered 2016-04-01: 5 mg via INTRAVENOUS
  Filled 2016-04-01: qty 2

## 2016-04-01 MED ORDER — HYDROMORPHONE HCL 1 MG/ML IJ SOLN
1.0000 mg | Freq: Once | INTRAMUSCULAR | Status: AC
  Administered 2016-04-01: 1 mg via INTRAVENOUS
  Filled 2016-04-01: qty 1

## 2016-04-01 MED ORDER — OXYCODONE-ACETAMINOPHEN 5-325 MG PO TABS: 1.0000 | ORAL_TABLET | ORAL | 0 refills | Status: DC | PRN

## 2016-04-01 MED ORDER — POTASSIUM CHLORIDE CRYS ER 20 MEQ PO TBCR
40.0000 meq | EXTENDED_RELEASE_TABLET | Freq: Once | ORAL | Status: AC
  Administered 2016-04-01: 40 meq via ORAL
  Filled 2016-04-01: qty 2

## 2016-04-01 MED ORDER — DIAZEPAM 5 MG PO TABS: 5.0000 mg | ORAL_TABLET | Freq: Four times a day (QID) | ORAL | 0 refills | Status: DC | PRN

## 2016-04-01 MED ORDER — IOPAMIDOL (ISOVUE-370) INJECTION 76%
100.0000 mL | Freq: Once | INTRAVENOUS | Status: AC | PRN
  Administered 2016-04-01: 75 mL via INTRAVENOUS

## 2016-04-01 NOTE — ED Notes (Signed)
Pt placed on bedpan

## 2016-04-01 NOTE — ED Triage Notes (Signed)
neck surgery 2002. Pain and weakness to neck and down  Back to bra level x 2 weeks. Per EMS pt head would flop some so c collar placed. Pt a/o and denies new injury or fall.

## 2016-04-01 NOTE — ED Notes (Signed)
Pt had jumped out of bed and came to door and hollared "I want my medicine". Walking in to give her medicine pt was educated on falls and reiterated what we advised when she got here which was to keep neck still as much as possible.

## 2016-04-01 NOTE — ED Notes (Signed)
Patient calling for ride and getting dressed. Respirations even and unlabored. Skin warm/dry. Discharge instructions reviewed with patient at this time. Patient given opportunity to voice concerns/ask questions.

## 2016-04-01 NOTE — ED Notes (Signed)
Assisted patient with bedpan 

## 2016-04-01 NOTE — ED Notes (Signed)
Takes 30mg  oxycodone, usually helps but has not helped since last night.

## 2016-04-01 NOTE — ED Notes (Signed)
Patient to MRI.

## 2016-04-01 NOTE — ED Provider Notes (Signed)
[facility] DEPT Provider Note   CSN: [id number] Arrival date & time: 04/01/16  [id number]  First Provider Contact:  09:00 AM       History   Chief Complaint Chief Complaint  Patient presents with  . Neck Pain    HPI [name] is a 50 y.o. female.  HPI   [name] is a(n) 50 y.o. female who presents to the Ed with cc of vertigo/ HA/ neck pain. She is a chronic daily smoker with a past medical history of Addison's disease, coronary artery disease, history of cardiac arrest, CHF, hypertension, hyperlipidemia, vertigo. She is status post cervical spine surgery. The patient states that she's had vertiginous symptoms for the past 2 weeks. She normally takes meclizine, however, has not been improving her symptoms. Patient states that prior to onset of her symptoms. She slept for 4 days. She states that she doesn't know why she slept. She denies feeling sick, having fevers, urinary symptoms. She states that her daughter would come over and try to wake her up but she go right back to sleep. She states that when she finally awoke she was disoriented and when she tried to stand up she had the onset of her vertiginous symptoms. She also acknowledges severe neck pain and occipital headache which she describes as sharp, constant with paroxysms of severe shooting pain. She states that it is bilateral. She denies any recent falls, head injuries, neck injuries. She denies upper extremity weakness or paresthesia. She states that this is different from her previous vertiginous episodes. Because of the severe headache. She has had associated nausea without vomiting. She states she is unable to sit up or walk without room spinning symptoms.   Past Medical History:  Diagnosis Date  . Adrenal insufficiency ([place])    diagnosed 2012  . Anxiety   . Astigmatism   . CAD (coronary artery disease)    Cath 2008 EF normal. RCA 50-60, Septal 50%. Myoview 3/12: EF 53% normal perfusion  . Cardiac arrest ([place])      2/2 adissonian crisis  . Cardiomyopathy    resolved  . Chest pain    chronicc  . CHF (congestive heart failure) ([place])   . Chronic back pain   . Chronic diarrhea   . Concussion    sept 28th 2014  . Gastroparesis   . HTN (hypertension)   . Hyperlipidemia   . Hypothyroidism   . Mitral valve prolapse   . Nondiabetic gastroparesis   . QT prolongation   . Tobacco abuse    down to 2 cigarettes per day    Patient Active Problem List   Diagnosis Date Noted  . Anxiety 05/26/2015  . Tremors of nervous system 05/26/2015  . Neck pain 05/26/2015  . Chest pain 05/25/2015  . Hot flushes, perimenopausal 07/24/2013  . Addison disease ([place]) 07/24/2013  . CAD (coronary artery disease) 04/11/2012  . GERD (gastroesophageal reflux disease) 04/11/2012  . Addison's disease ([place]) 03/03/2012  . Dizziness 12/15/2010  . QT prolongation 12/15/2010  . Palpitations 01/01/2009  . Hypothyroidism 12/31/2008  . Hyperlipidemia 12/31/2008  . OVERWEIGHT/OBESITY 12/31/2008  . Essential hypertension 12/31/2008  . CARDIOMYOPATHY, SECONDARY 12/31/2008  . CHEST PAIN-UNSPECIFIED 12/31/2008    Past Surgical History:  Procedure Laterality Date  . ABDOMINAL HYSTERECTOMY    . CARDIAC CATHETERIZATION  03/2007   showed 60% lesion in the right coronary artery  . CHOLECYSTECTOMY    . LEFT HEART CATHETERIZATION WITH CORONARY ANGIOGRAM N/A 04/13/2012   Procedure: LEFT HEART  CATHETERIZATION WITH CORONARY ANGIOGRAM;  Surgeon: Hillary Bow, MD;  Location: [facility] CATH LAB;  Service: Cardiovascular;  Laterality: N/A;  . SPINE SURGERY    . VARICOSE VEIN SURGERY    . VESICOVAGINAL FISTULA CLOSURE W/ TAH      OB History    Gravida Para Term Preterm AB Living   3 2 2  0 1 2   SAB TAB Ectopic Multiple Live Births   1               Home Medications    Prior to Admission medications   Medication Sig Start Date End Date Taking? Authorizing Provider  amLODipine (NORVASC) 5 MG tablet TAKE 1 TABLET (5 MG TOTAL) BY  MOUTH DAILY. 07/25/15  Yes Pixie Casino, MD  aspirin 81 MG chewable tablet Chew 81 mg by mouth daily.    Yes Historical Provider, MD  cholecalciferol (VITAMIN D) 1000 UNITS tablet Take 1,000 Units by mouth daily.   Yes Historical Provider, MD  cyclobenzaprine (FLEXERIL) 10 MG tablet Take 10 mg by mouth 3 (three) times daily as needed for muscle spasms.    Yes Historical Provider, MD  hydrochlorothiazide (HYDRODIURIL) 12.5 MG tablet Take 6.25 mg by mouth 2 (two) times daily.    Yes Historical Provider, MD  hydrocortisone (CORTEF) 10 MG tablet Take 10-20 mg by mouth 2 (two) times daily. Pt takes 20 mg in the morning and 10 mg at night   Yes Historical Provider, MD  levothyroxine (SYNTHROID, LEVOTHROID) 75 MCG tablet Take 75 mcg by mouth daily before breakfast.   Yes Historical Provider, MD  LORazepam (ATIVAN) 2 MG tablet Take 2 mg by mouth 4 (four) times daily as needed for anxiety.   Yes Historical Provider, MD  meclizine (ANTIVERT) 25 MG tablet Take 1 tablet (25 mg total) by mouth 4 (four) times daily as needed for dizziness. 06/15/13  Yes Rolland Porter, MD  metoCLOPramide (REGLAN) 10 MG tablet Take 10 mg by mouth 3 (three) times daily. Runny stools   Yes Historical Provider, MD  nitroGLYCERIN (NITROSTAT) 0.4 MG SL tablet Place 0.4 mg under the tongue every 5 (five) minutes as needed for chest pain.   Yes Historical Provider, MD  ondansetron (ZOFRAN ODT) 4 MG disintegrating tablet Take 1 tablet (4 mg total) by mouth every 8 (eight) hours as needed for nausea or vomiting. 02/04/16  Yes Ozella Almond Ward, PA-C  oxycodone (ROXICODONE) 30 MG immediate release tablet Take 30 mg by mouth every 6 (six) hours as needed for pain.    Yes Historical Provider, MD  pantoprazole (PROTONIX) 20 MG tablet Take 1 tablet (20 mg total) by mouth daily. 02/04/16  Yes Jaime Pilcher Ward, PA-C  pramipexole (MIRAPEX) 0.125 MG tablet Take 0.125 mg by mouth at bedtime as needed (restles leg syndrome).   Yes Historical Provider, MD   vitamin C (ASCORBIC ACID) 500 MG tablet Take 500 mg by mouth daily.   Yes Historical Provider, MD  zolpidem (AMBIEN) 10 MG tablet Take 10 mg by mouth at bedtime as needed for sleep.  03/20/14  Yes Historical Provider, MD  diazepam (VALIUM) 5 MG tablet Take 1 tablet (5 mg total) by mouth every 6 (six) hours as needed (vertigo and nausea). 04/01/16   Margarita Mail, PA-C  oxyCODONE-acetaminophen (PERCOCET) 5-325 MG tablet Take 1-2 tablets by mouth every 4 (four) hours as needed. 04/01/16   Margarita Mail, PA-C    Family History Family History  Problem Relation Age of Onset  . Cancer Father  Colon  . Coronary artery disease    . Heart attack Mother     Social History Social History  Substance Use Topics  . Smoking status: Current Every Day Smoker    Packs/day: 0.50    Years: 10.00    Types: Cigarettes  . Smokeless tobacco: Never Used     Comment: smokes 6-7 cigarettes per day  . Alcohol use No     Allergies   Bee venom; Doxycycline; Erythromycin; Ibuprofen; Morphine and related; Penicillins; Sulfa antibiotics; Cephalexin; Cephalexin; Dust mite extract; Morphine; Tramadol; Other; and Sulfonamide derivatives   Review of Systems Review of Systems  Ten systems reviewed and are negative for acute change, except as noted in the HPI.   Physical Exam Updated Vital Signs BP 136/83   Pulse 72   Temp 98.2 F (36.8 C) (Oral)   Resp 12   Ht 5\' 5"  (1.651 m)   Wt 83 kg   SpO2 91%   BMI 30.45 kg/m   Physical Exam  Constitutional: She is oriented to person, place, and time. She appears well-developed and well-nourished. No distress.  Smells strongly of cigarettes  HENT:  Head: Normocephalic and atraumatic.  Right Ear: External ear normal.  Left Ear: External ear normal.  Mouth/Throat: No oropharyngeal exudate.  Eyes: Conjunctivae are normal. Pupils are equal, round, and reactive to light. No scleral icterus.  + Horizontal nystagmus, worse to the Left  Neck:  On c spine  precautions with hard collar  Cardiovascular: Normal rate, regular rhythm, normal heart sounds and intact distal pulses.  Exam reveals no gallop and no friction rub.   No murmur heard. Pulmonary/Chest: Effort normal and breath sounds normal. No respiratory distress.  Abdominal: Soft. Bowel sounds are normal. She exhibits no distension and no mass. There is no tenderness. There is no guarding.  Musculoskeletal: Normal range of motion. She exhibits no edema or tenderness.  No meningismus  Neurological: She is alert and oriented to person, place, and time. She has normal reflexes. A cranial nerve deficit is present. Coordination normal.  Speech is clear and goal oriented, follows commands Major Cranial nerves without deficit  EXCEPT FOR NYSTAGMUS, no facial droop Normal strength in upper and lower extremities bilaterally including dorsiflexion and plantar flexion, strong and equal grip strength Sensation normal to light and sharp touch Moves extremities without ataxia, coordination intact Normal finger to nose and rapid alternating movements Neg romberg, no pronator drift    Skin: Skin is warm and dry. No rash noted. She is not diaphoretic.  Nursing note and vitals reviewed.    ED Treatments / Results  Labs (all labs ordered are listed, but only abnormal results are displayed) Labs Reviewed  COMPREHENSIVE METABOLIC PANEL - Abnormal; Notable for the following:       Result Value   Potassium 3.1 (*)    Calcium 8.5 (*)    ALT 13 (*)    All other components within normal limits  CBC WITH DIFFERENTIAL/PLATELET - Abnormal; Notable for the following:    WBC 12.1 (*)    Monocytes Absolute 1.1 (*)    All other components within normal limits  URINALYSIS, ROUTINE W REFLEX MICROSCOPIC (NOT AT [facility]) - Abnormal; Notable for the following:    Specific Gravity, Urine <1.005 (*)    All other components within normal limits  TROPONIN I    EKG  EKG Interpretation None       Radiology Ct  Angio Head W Or Wo Contrast  Result Date: 04/01/2016 CLINICAL DATA:  Severe headache and vertigo. Posterior head and neck pain radiating down the back and both arms for 2 weeks. Prior cervical spine surgery. EXAM: CT ANGIOGRAPHY HEAD AND NECK TECHNIQUE: Multidetector CT imaging of the head and neck was performed using the standard protocol during bolus administration of intravenous contrast. Multiplanar CT image reconstructions and MIPs were obtained to evaluate the vascular anatomy. Carotid stenosis measurements (when applicable) are obtained utilizing NASCET criteria, using the distal internal carotid diameter as the denominator. CONTRAST:  75 mL Isovue 370 COMPARISON:  Head CT 03/27/2014. Cervical spine MRI 05/03/2013. Cervical spine CT 02/03/2013. Head MRI 09/20/2012. FINDINGS: CT HEAD Brain: There is no evidence of acute cortical infarct, intracranial hemorrhage, mass, midline shift, or extra-axial fluid collection. The ventricles and sulci are normal. Calvarium and skull base: Unremarkable. Paranasal sinuses: Clear. Orbits: Unremarkable. CTA NECK Aortic arch: 3 vessel aortic arch. Brachiocephalic and subclavian arteries are widely patent. Right carotid system: Patent without evidence of stenosis or dissection. Minimal atherosclerotic plaque in the proximal ICA. Left carotid system: Patent without evidence of stenosis or dissection. Mild atherosclerotic plaque about the carotid bifurcation. Vertebral arteries: Patent without evidence of stenosis or dissection. Codominant. Skeleton: Chronic, slightly depressed fracture of the anterior wall of the right maxillary sinus. Solid C5-C7 ACDF. Uncovertebral spurring results in moderate right neural foraminal stenosis at C3-4, severe right neural foraminal stenosis at C4-5, and moderate right greater than left foraminal stenosis at C7-T1. Other neck: No neck mass. CTA HEAD Anterior circulation: The internal carotid arteries are patent from skullbase to carotid termini  without stenosis. There is a 2 mm aneurysm projecting anteriorly from the proximal right cavernous ICA (series 12, image 84). ACAs and MCAs are patent without evidence of major branch occlusion or significant stenosis. The left A1 segment is dominant. Posterior circulation: Intracranial vertebral arteries are patent to the basilar. There is mild non stenotic plaque in the distal left vertebral artery. PICA and SCA origins are patent. Basilar artery is widely patent. There is a patent right posterior communicating artery, and the right P1 segment is hypoplastic. PCAs are patent without evidence of significant proximal stenosis. Venous sinuses: Patent. Anatomic variants: Hypoplastic right A1 and right P1 segments. Delayed phase: No abnormal enhancement. IMPRESSION: 1. Mild extracranial atherosclerosis. No evidence of dissection or stenosis. 2. No significant intracranial arterial stenosis or major branch occlusion. 3. 2 mm right cavernous ICA aneurysm. 4. Solid C5-C7 ACDF. Moderate to severe multilevel neural foraminal stenosis. Electronically Signed   By: Logan Bores M.D.   On: 04/01/2016 12:01  Ct Angio Neck W/cm &/or Wo/cm  Result Date: 04/01/2016 CLINICAL DATA:  Severe headache and vertigo. Posterior head and neck pain radiating down the back and both arms for 2 weeks. Prior cervical spine surgery. EXAM: CT ANGIOGRAPHY HEAD AND NECK TECHNIQUE: Multidetector CT imaging of the head and neck was performed using the standard protocol during bolus administration of intravenous contrast. Multiplanar CT image reconstructions and MIPs were obtained to evaluate the vascular anatomy. Carotid stenosis measurements (when applicable) are obtained utilizing NASCET criteria, using the distal internal carotid diameter as the denominator. CONTRAST:  75 mL Isovue 370 COMPARISON:  Head CT 03/27/2014. Cervical spine MRI 05/03/2013. Cervical spine CT 02/03/2013. Head MRI 09/20/2012. FINDINGS: CT HEAD Brain: There is no evidence  of acute cortical infarct, intracranial hemorrhage, mass, midline shift, or extra-axial fluid collection. The ventricles and sulci are normal. Calvarium and skull base: Unremarkable. Paranasal sinuses: Clear. Orbits: Unremarkable. CTA NECK Aortic arch: 3 vessel aortic arch. Brachiocephalic and subclavian  arteries are widely patent. Right carotid system: Patent without evidence of stenosis or dissection. Minimal atherosclerotic plaque in the proximal ICA. Left carotid system: Patent without evidence of stenosis or dissection. Mild atherosclerotic plaque about the carotid bifurcation. Vertebral arteries: Patent without evidence of stenosis or dissection. Codominant. Skeleton: Chronic, slightly depressed fracture of the anterior wall of the right maxillary sinus. Solid C5-C7 ACDF. Uncovertebral spurring results in moderate right neural foraminal stenosis at C3-4, severe right neural foraminal stenosis at C4-5, and moderate right greater than left foraminal stenosis at C7-T1. Other neck: No neck mass. CTA HEAD Anterior circulation: The internal carotid arteries are patent from skullbase to carotid termini without stenosis. There is a 2 mm aneurysm projecting anteriorly from the proximal right cavernous ICA (series 12, image 84). ACAs and MCAs are patent without evidence of major branch occlusion or significant stenosis. The left A1 segment is dominant. Posterior circulation: Intracranial vertebral arteries are patent to the basilar. There is mild non stenotic plaque in the distal left vertebral artery. PICA and SCA origins are patent. Basilar artery is widely patent. There is a patent right posterior communicating artery, and the right P1 segment is hypoplastic. PCAs are patent without evidence of significant proximal stenosis. Venous sinuses: Patent. Anatomic variants: Hypoplastic right A1 and right P1 segments. Delayed phase: No abnormal enhancement. IMPRESSION: 1. Mild extracranial atherosclerosis. No evidence of  dissection or stenosis. 2. No significant intracranial arterial stenosis or major branch occlusion. 3. 2 mm right cavernous ICA aneurysm. 4. Solid C5-C7 ACDF. Moderate to severe multilevel neural foraminal stenosis. Electronically Signed   By: Logan Bores M.D.   On: 04/01/2016 12:01  Mr Brain Wo Contrast  Result Date: 04/01/2016 CLINICAL DATA:  Vertebrobasilar insufficiency. Evaluation for posterior stroke. EXAM: MRI HEAD WITHOUT CONTRAST TECHNIQUE: Multiplanar, multiecho pulse sequences of the brain and surrounding structures were obtained without intravenous contrast. COMPARISON:  Head CTA 04/01/2012.  Head MRI 09/20/2012. FINDINGS: There is no evidence of acute infarct, intracranial hemorrhage, mass, midline shift, or extra-axial fluid collection. The ventricles and sulci are normal. A few punctate foci of T2 hyperintensity in the cerebral white matter are unchanged, nonspecific, and not greater than expected for patient's age. The brainstem and cerebellum are normal. Orbits are unremarkable. Paranasal sinuses and mastoid air cells are clear. Major intracranial vascular flow voids are preserved. No suspicious osseous lesion. IMPRESSION: Negative brain MRI. Electronically Signed   By: Logan Bores M.D.   On: 04/01/2016 16:36  Mr Cervical Spine Wo Contrast  Result Date: 04/01/2016 CLINICAL DATA:  Cervical radiculopathy. EXAM: MRI CERVICAL SPINE WITHOUT CONTRAST TECHNIQUE: Multiplanar, multisequence MR imaging of the cervical spine was performed. No intravenous contrast was administered. COMPARISON:  MRI 04/13/2013 FINDINGS: Image quality degraded by motion. Alignment: Slight retrolisthesis C4-5 as noted previously. Vertebrae: ACDF with metal plate and screws at 075-GRM and C6-7. Negative for fracture or mass lesion. Cord: Cord evaluation limited due to motion. No cord signal abnormality is detected. Posterior Fossa, vertebral arteries, paraspinal tissues: Negative Disc levels: C2-3:  Negative C3-4: Disc  degeneration and spondylosis. Diffuse uncinate spurring causing mild spinal stenosis and mild foraminal stenosis bilaterally. C4-5: Mild retrolisthesis. Disc degeneration and spondylosis. Diffuse uncinate spurring causing mild spinal stenosis and moderate foraminal stenosis bilaterally. No change from the prior study. C5-6: Interbody fusion without stenosis. C6-7:  Interbody fusion without stenosis C7-T1: Moderate disc degeneration with diffuse uncinate spurring. Mild spinal stenosis and moderate foraminal stenosis bilaterally unchanged T1-2:  Left foraminal encroachment due to spurring unchanged IMPRESSION: Chronic degenerative  changes in the cervical spine similar to the prior study. No superimposed acute abnormality ACDF C5-6 and C6-7 unchanged from the prior study. Electronically Signed   By: Franchot Gallo M.D.   On: 04/01/2016 16:49   Procedures Procedures (including critical care time)  Medications Ordered in ED Medications  diazepam (VALIUM) injection 5 mg (5 mg Intravenous Given 04/01/16 0936)  fentaNYL (SUBLIMAZE) injection 50 mcg (50 mcg Intravenous Given 04/01/16 0935)  iopamidol (ISOVUE-370) 76 % injection 100 mL (75 mLs Intravenous Contrast Given 04/01/16 1036)  HYDROmorphone (DILAUDID) injection 0.5 mg (0.5 mg Intravenous Given 04/01/16 1119)  potassium chloride SA (K-DUR,KLOR-CON) CR tablet 40 mEq (40 mEq Oral Given 04/01/16 1116)  HYDROmorphone (DILAUDID) injection 0.5 mg (0.5 mg Intravenous Given 04/01/16 1348)  HYDROmorphone (DILAUDID) injection 1 mg (1 mg Intravenous Given 04/01/16 1657)  diazepam (VALIUM) injection 5 mg (5 mg Intravenous Given 04/01/16 1641)     Initial Impression / Assessment and Plan / ED Course  I have reviewed the triage vital signs and the nursing notes.  Pertinent labs & imaging results that were available during my care of the patient were reviewed by me and considered in my medical decision making (see chart for details).  Clinical Course  Value Comment  By Time   Patient with mild hypokalemia. Her Blood pressure has decreased sig with medications including valium and pain control Improved briefly with fentanyl, but now her head is hurting again and she requests more pain meds. Margarita Mail, PA-C 07/27 1100  Potassium: (!) 3.1 Mild hypokalemia will treat with oral meds Margarita Mail, PA-C 07/27 1101   Patient with Cavernous ICA anueurysm. Will need f/u with neurosurgery. She also has significant degenerative joint disease without acute change from prior studies. The patient has already ambulated without falling in the ED when she jumped out of bed and ran to the door to demand pain meds.   The patient was counseled on the dangers of tobacco use, and was advised to quit.  Reviewed strategies to maximize success, including removing cigarettes and smoking materials from environment, stress management, substitution of other forms of reinforcement, support of family/friends and written materials. Will refer to ENT for her peripheral vertigo.  Patient seen in shared visit with attending physician.  The patient appears reasonably screened and/or stabilized for discharge and I doubt any other medical condition or other [facility] requiring further screening, evaluation, or treatment in the ED at this time prior to discharge.     Margarita Mail, PA-C 07/27 1704    Patient reviewed in the [place] without any rx for controlled substances in the last 6 months. Final Clinical Impressions(s) / ED Diagnoses   Final diagnoses:  Vertigo  Cervical neck pain with evidence of disc disease  DJD (degenerative joint disease) of cervical spine  Bad headache  Aneurysm of cavernous portion of right internal carotid artery    New Prescriptions New Prescriptions   DIAZEPAM (VALIUM) 5 MG TABLET    Take 1 tablet (5 mg total) by mouth every 6 (six) hours as needed (vertigo and nausea).   OXYCODONE-ACETAMINOPHEN (PERCOCET) 5-325 MG TABLET    Take 1-2 tablets by mouth  every 4 (four) hours as needed.     Margarita Mail, PA-C 04/01/16 1715    Duffy Bruce, MD 04/01/16 [id number]

## 2016-04-01 NOTE — ED Notes (Signed)
Reuturned from MRI. Immediately asking for pain medicine and dizziness medication. Will notified primary provider.

## 2016-04-01 NOTE — ED Notes (Signed)
PA in speaking with patient about results.

## 2016-04-01 NOTE — ED Notes (Signed)
MD called for nausea med.

## 2016-04-14 DIAGNOSIS — M1991 Primary osteoarthritis, unspecified site: Secondary | ICD-10-CM | POA: Diagnosis not present

## 2016-04-14 DIAGNOSIS — R42 Dizziness and giddiness: Secondary | ICD-10-CM | POA: Diagnosis not present

## 2016-04-14 DIAGNOSIS — E271 Primary adrenocortical insufficiency: Secondary | ICD-10-CM | POA: Diagnosis not present

## 2016-04-14 DIAGNOSIS — Z6828 Body mass index (BMI) 28.0-28.9, adult: Secondary | ICD-10-CM | POA: Diagnosis not present

## 2016-04-14 DIAGNOSIS — I10 Essential (primary) hypertension: Secondary | ICD-10-CM | POA: Diagnosis not present

## 2016-04-14 DIAGNOSIS — G894 Chronic pain syndrome: Secondary | ICD-10-CM | POA: Diagnosis not present

## 2016-04-14 DIAGNOSIS — E663 Overweight: Secondary | ICD-10-CM | POA: Diagnosis not present

## 2016-04-16 ENCOUNTER — Emergency Department (HOSPITAL_COMMUNITY)
Admission: EM | Admit: 2016-04-16 | Discharge: 2016-04-16 | Disposition: A | Discharge status: Home / Self Care | Payer: Medicare Other | Attending: Emergency Medicine | Admitting: Emergency Medicine

## 2016-04-16 ENCOUNTER — Encounter (HOSPITAL_COMMUNITY): Payer: Self-pay | Admitting: Emergency Medicine

## 2016-04-16 DIAGNOSIS — I509 Heart failure, unspecified: Secondary | ICD-10-CM | POA: Diagnosis not present

## 2016-04-16 DIAGNOSIS — Z7982 Long term (current) use of aspirin: Secondary | ICD-10-CM | POA: Diagnosis not present

## 2016-04-16 DIAGNOSIS — Z79899 Other long term (current) drug therapy: Secondary | ICD-10-CM | POA: Insufficient documentation

## 2016-04-16 DIAGNOSIS — E039 Hypothyroidism, unspecified: Secondary | ICD-10-CM | POA: Diagnosis not present

## 2016-04-16 DIAGNOSIS — F17210 Nicotine dependence, cigarettes, uncomplicated: Secondary | ICD-10-CM | POA: Insufficient documentation

## 2016-04-16 DIAGNOSIS — H81399 Other peripheral vertigo, unspecified ear: Secondary | ICD-10-CM | POA: Insufficient documentation

## 2016-04-16 DIAGNOSIS — I110 Hypertensive heart disease with heart failure: Secondary | ICD-10-CM | POA: Diagnosis not present

## 2016-04-16 DIAGNOSIS — R42 Dizziness and giddiness: Secondary | ICD-10-CM | POA: Diagnosis present

## 2016-04-16 DIAGNOSIS — M542 Cervicalgia: Secondary | ICD-10-CM | POA: Insufficient documentation

## 2016-04-16 DIAGNOSIS — I2510 Atherosclerotic heart disease of native coronary artery without angina pectoris: Secondary | ICD-10-CM | POA: Diagnosis not present

## 2016-04-16 LAB — I-STAT CHEM 8, ED
BUN: 11 mg/dL (ref 6–20)
BUN: 12 mg/dL (ref 6–20)
CALCIUM ION: 1.19 mmol/L (ref 1.13–1.30)
CREATININE: 1 mg/dL (ref 0.44–1.00)
Calcium, Ion: 1.22 mmol/L (ref 1.13–1.30)
Chloride: 99 mmol/L — ABNORMAL LOW (ref 101–111)
Chloride: 103 mmol/L (ref 101–111)
Creatinine, Ser: 1 mg/dL (ref 0.44–1.00)
GLUCOSE: 90 mg/dL (ref 65–99)
Glucose, Bld: 92 mg/dL (ref 65–99)
HCT: 45 % (ref 36.0–46.0)
HEMATOCRIT: 52 % — AB (ref 36.0–46.0)
HEMOGLOBIN: 15.3 g/dL — AB (ref 12.0–15.0)
HEMOGLOBIN: 17.7 g/dL — AB (ref 12.0–15.0)
POTASSIUM: 3.6 mmol/L (ref 3.5–5.1)
Potassium: 3.9 mmol/L (ref 3.5–5.1)
SODIUM: 143 mmol/L (ref 135–145)
Sodium: 141 mmol/L (ref 135–145)
TCO2: 23 mmol/L (ref 0–100)
TCO2: 29 mmol/L (ref 0–100)

## 2016-04-16 LAB — I-STAT TROPONIN, ED: Troponin i, poc: 0.01 ng/mL (ref 0.00–0.08)

## 2016-04-16 MED ORDER — OXYCODONE HCL 5 MG PO TABS
30.0000 mg | ORAL_TABLET | Freq: Once | ORAL | Status: AC
  Administered 2016-04-16: 30 mg via ORAL
  Filled 2016-04-16: qty 6

## 2016-04-16 MED ORDER — MECLIZINE HCL 25 MG PO TABS: ORAL_TABLET | ORAL | 0 refills | Status: AC

## 2016-04-16 MED ORDER — DIAZEPAM 5 MG PO TABS
10.0000 mg | ORAL_TABLET | Freq: Once | ORAL | Status: AC
  Administered 2016-04-16: 10 mg via ORAL
  Filled 2016-04-16: qty 2

## 2016-04-16 NOTE — ED Triage Notes (Addendum)
Pt reports dizziness x6 months, pt has vertigo.  PT states her dizziness started feeling different 2 weeks ago (seen here in ED) saying that her posterior head is numb. Pt states that she was dx with an aneurysm.   Pt gets more dizzy with movement such as standing.  Pt has nausea.  Pt has been on meclizine for 9 months. Pt fell yesterday, pain in right hip, did not hit head, no loc.

## 2016-04-16 NOTE — ED Provider Notes (Signed)
[facility] DEPT Provider Note   CSN: [id number] Arrival date & time: 04/16/16  0915  First Provider Contact:  None       History   Chief Complaint Chief Complaint  Patient presents with  . Dizziness    HPI [name] is a 50 y.o. female.  HPI  Pt was seen at 0935. Per pt, c/o gradual onset and persistence of multiple intermittent episodes of vertigo for several years, worse over the past 4 weeks. Pt describes her symptoms as "everything is spinning," worse when she turns her head side to side or changes body positions. Pt states she has been taking her antivert with "some" improvement. Pt was evaluated in the ED 2 weeks ago for her symptoms as well as her PMD 2 days ago. Pt has ENT appointment in 2 weeks. States she was told to come to the ED "and get admitted until then." Has been associated with acute flair of her chronic neck "pain." Denies any change in her symptoms over the past 4 weeks, denies any new symptoms. Denies any change in her usual chronic neck pain. Denies head or neck injury, no CP/palpitations, no SOB/cough, no abd pain, no N/V/D, no headache, no visual changes, no focal motor weakness, no tingling/numbness in extremities, no ataxia, no slurred speech, no facial droop.    Past Medical History:  Diagnosis Date  . Adrenal insufficiency ([place])    diagnosed 2012  . Anxiety   . Astigmatism   . CAD (coronary artery disease)    Cath 2008 EF normal. RCA 50-60, Septal 50%. Myoview 3/12: EF 53% normal perfusion  . Cardiac arrest ([place])    2/2 adissonian crisis  . Cardiomyopathy    resolved  . Chest pain    chronicc  . CHF (congestive heart failure) ([place])   . Chronic back pain   . Chronic diarrhea   . Concussion    sept 28th 2014  . Gastroparesis   . HTN (hypertension)   . Hyperlipidemia   . Hypothyroidism   . Mitral valve prolapse   . Nondiabetic gastroparesis   . QT prolongation   . Tobacco abuse    down to 2 cigarettes per day    Patient Active  Problem List   Diagnosis Date Noted  . Anxiety 05/26/2015  . Tremors of nervous system 05/26/2015  . Neck pain 05/26/2015  . Chest pain 05/25/2015  . Hot flushes, perimenopausal 07/24/2013  . Addison disease ([place]) 07/24/2013  . CAD (coronary artery disease) 04/11/2012  . GERD (gastroesophageal reflux disease) 04/11/2012  . Addison's disease ([place]) 03/03/2012  . Dizziness 12/15/2010  . QT prolongation 12/15/2010  . Palpitations 01/01/2009  . Hypothyroidism 12/31/2008  . Hyperlipidemia 12/31/2008  . OVERWEIGHT/OBESITY 12/31/2008  . Essential hypertension 12/31/2008  . CARDIOMYOPATHY, SECONDARY 12/31/2008  . CHEST PAIN-UNSPECIFIED 12/31/2008    Past Surgical History:  Procedure Laterality Date  . ABDOMINAL HYSTERECTOMY    . CARDIAC CATHETERIZATION  03/2007   showed 60% lesion in the right coronary artery  . CHOLECYSTECTOMY    . LEFT HEART CATHETERIZATION WITH CORONARY ANGIOGRAM N/A 04/13/2012   Procedure: LEFT HEART CATHETERIZATION WITH CORONARY ANGIOGRAM;  Surgeon: Hillary Bow, MD;  Location: [facility] CATH LAB;  Service: Cardiovascular;  Laterality: N/A;  . SPINE SURGERY    . VARICOSE VEIN SURGERY    . VESICOVAGINAL FISTULA CLOSURE W/ TAH      OB History    Gravida Para Term Preterm AB Living   3 2 2  0 1 2  SAB TAB Ectopic Multiple Live Births   1               Home Medications    Prior to Admission medications   Medication Sig Start Date End Date Taking? Authorizing Provider  amLODipine (NORVASC) 5 MG tablet TAKE 1 TABLET (5 MG TOTAL) BY MOUTH DAILY. 07/25/15   Pixie Casino, MD  aspirin 81 MG chewable tablet Chew 81 mg by mouth daily.     Historical Provider, MD  cholecalciferol (VITAMIN D) 1000 UNITS tablet Take 1,000 Units by mouth daily.    Historical Provider, MD  cyclobenzaprine (FLEXERIL) 10 MG tablet Take 10 mg by mouth 3 (three) times daily as needed for muscle spasms.     Historical Provider, MD  diazepam (VALIUM) 5 MG tablet Take 1 tablet (5 mg total) by  mouth every 6 (six) hours as needed (vertigo and nausea). 04/01/16   Margarita Mail, PA-C  hydrochlorothiazide (HYDRODIURIL) 12.5 MG tablet Take 6.25 mg by mouth 2 (two) times daily.     Historical Provider, MD  hydrocortisone (CORTEF) 10 MG tablet Take 10-20 mg by mouth 2 (two) times daily. Pt takes 20 mg in the morning and 10 mg at night    Historical Provider, MD  levothyroxine (SYNTHROID, LEVOTHROID) 75 MCG tablet Take 75 mcg by mouth daily before breakfast.    Historical Provider, MD  LORazepam (ATIVAN) 2 MG tablet Take 2 mg by mouth 4 (four) times daily as needed for anxiety.    Historical Provider, MD  meclizine (ANTIVERT) 25 MG tablet Take 1 tablet (25 mg total) by mouth 4 (four) times daily as needed for dizziness. 06/15/13   Rolland Porter, MD  metoCLOPramide (REGLAN) 10 MG tablet Take 10 mg by mouth 3 (three) times daily. Runny stools    Historical Provider, MD  nitroGLYCERIN (NITROSTAT) 0.4 MG SL tablet Place 0.4 mg under the tongue every 5 (five) minutes as needed for chest pain.    Historical Provider, MD  ondansetron (ZOFRAN ODT) 4 MG disintegrating tablet Take 1 tablet (4 mg total) by mouth every 8 (eight) hours as needed for nausea or vomiting. 02/04/16   Ozella Almond Ward, PA-C  oxycodone (ROXICODONE) 30 MG immediate release tablet Take 30 mg by mouth every 6 (six) hours as needed for pain.     Historical Provider, MD  oxyCODONE-acetaminophen (PERCOCET) 5-325 MG tablet Take 1-2 tablets by mouth every 4 (four) hours as needed. 04/01/16   Margarita Mail, PA-C  pantoprazole (PROTONIX) 20 MG tablet Take 1 tablet (20 mg total) by mouth daily. 02/04/16   Ozella Almond Ward, PA-C  pramipexole (MIRAPEX) 0.125 MG tablet Take 0.125 mg by mouth at bedtime as needed (restles leg syndrome).    Historical Provider, MD  vitamin C (ASCORBIC ACID) 500 MG tablet Take 500 mg by mouth daily.    Historical Provider, MD  zolpidem (AMBIEN) 10 MG tablet Take 10 mg by mouth at bedtime as needed for sleep.  03/20/14    Historical Provider, MD    Family History Family History  Problem Relation Age of Onset  . Cancer Father     Colon  . Coronary artery disease    . Heart attack Mother     Social History Social History  Substance Use Topics  . Smoking status: Current Every Day Smoker    Packs/day: 0.50    Years: 10.00    Types: Cigarettes  . Smokeless tobacco: Never Used     Comment: smokes 6-7 cigarettes per day  .  Alcohol use No     Allergies   Bee venom; Doxycycline; Erythromycin; Ibuprofen; Morphine and related; Penicillins; Sulfa antibiotics; Cephalexin; Cephalexin; Dust mite extract; Morphine; Tramadol; Other; and Sulfonamide derivatives   Review of Systems Review of Systems ROS: Statement: All systems negative except as marked or noted in the HPI; Constitutional: Negative for fever and chills. ; ; Eyes: Negative for eye pain, redness and discharge. ; ; ENMT: Negative for ear pain, hoarseness, nasal congestion, sinus pressure and sore throat. ; ; Cardiovascular: Negative for chest pain, palpitations, diaphoresis, dyspnea and peripheral edema. ; ; Respiratory: Negative for cough, wheezing and stridor. ; ; Gastrointestinal: Negative for nausea, vomiting, diarrhea, abdominal pain, blood in stool, hematemesis, jaundice and rectal bleeding. ; ; Genitourinary: Negative for dysuria, flank pain and hematuria. ; ; Musculoskeletal: +chronic neck pain. Negative for back pain. Negative for swelling and trauma.; ; Skin: Negative for pruritus, rash, abrasions, blisters, bruising and skin lesion.; ; Neuro: +vertigo. Negative for headache, lightheadedness and neck stiffness. Negative for weakness, altered level of consciousness, altered mental status, extremity weakness, paresthesias, involuntary movement, seizure and syncope.       Physical Exam Updated Vital Signs BP 143/92 (BP Location: Right Arm)   Pulse 85   Temp 98.5 F (36.9 C) (Oral)   Resp 12   Ht 5\' 5"  (1.651 m)   Wt 178 lb (80.7 kg)    SpO2 97%   BMI 29.62 kg/m    10:29 Orthostatic Vital Signs VP  Orthostatic Lying   BP- Lying: 122/73  Pulse- Lying: 88      Orthostatic Sitting  BP- Sitting: 129/78  Pulse- Sitting: 86      Orthostatic Standing at 0 minutes  BP- Standing at 0 minutes: 122/82  Pulse- Standing at 0 minutes: 89     Physical Exam 0940: Physical examination:  Nursing notes reviewed; Vital signs and O2 SAT reviewed;  Constitutional: Well developed, Well nourished, Well hydrated, In no acute distress; Head:  Normocephalic, atraumatic; Eyes: EOMI, PERRL, No scleral icterus; ENMT: Mouth and pharynx normal, Mucous membranes moist; Neck: Supple, Full range of motion, No lymphadenopathy; Cardiovascular: Regular rate and rhythm, No gallop; Respiratory: Breath sounds clear & equal bilaterally, No wheezes.  Speaking full sentences with ease, Normal respiratory effort/excursion; Chest: Nontender, Movement normal; Abdomen: Soft, Nontender, Nondistended, Normal bowel sounds; Genitourinary: No CVA tenderness; Extremities: Pulses normal, No tenderness, No edema, No calf edema or asymmetry.; Neuro: AA&Ox3,Major CN grossly intact. Speech clear.  No facial droop.  +left horizontal end gaze fatigable nystagmus. Grips equal. Strength 5/5 equal bilat UE's and LE's.  DTR 2/4 equal bilat UE's and LE's.  No gross sensory deficits.  Normal cerebellar testing bilat UE's (finger-nose) and LE's (heel-shin)..; Skin: Color normal, Warm, Dry.   ED Treatments / Results  Labs (all labs ordered are listed, but only abnormal results are displayed)   EKG  EKG Interpretation  Date/Time:  Friday April 16 2016 09:22:08 EDT Ventricular Rate:  89 PR Interval:    QRS Duration: 99 QT Interval:  370 QTC Calculation: 451 R Axis:   87 Text Interpretation:  Sinus rhythm Baseline wander When compared with ECG of 02/04/2016 No significant change was found Confirmed by [facility]  MD, Nunzio Cory [phone]) on 04/16/2016 10:03:04 AM        Radiology   Procedures Procedures (including critical care time)  Medications Ordered in ED Medications  oxyCODONE (Oxy IR/ROXICODONE) immediate release tablet 30 mg (30 mg Oral Given 04/16/16 0951)  diazepam (VALIUM) tablet 10 mg (10  mg Oral Given 04/16/16 0957)     Initial Impression / Assessment and Plan / ED Course  I have reviewed the triage vital signs and the nursing notes.  Pertinent labs & imaging results that were available during my care of the patient were reviewed by me and considered in my medical decision making (see chart for details).  MDM Reviewed: previous chart, nursing note and vitals Reviewed previous: ECG, labs, MRI and CT scan Interpretation: labs and ECG   Results for orders placed or performed during the hospital encounter of 04/16/16  I-stat Chem 8, ED  Result Value Ref Range   Sodium 141 135 - 145 mmol/L   Potassium 3.6 3.5 - 5.1 mmol/L   Chloride 99 (L) 101 - 111 mmol/L   BUN 11 6 - 20 mg/dL   Creatinine, Ser 1.00 0.44 - 1.00 mg/dL   Glucose, Bld 90 65 - 99 mg/dL   Calcium, Ion 1.22 1.13 - 1.30 mmol/L   TCO2 29 0 - 100 mmol/L   Hemoglobin 15.3 (H) 12.0 - 15.0 g/dL   HCT 45.0 36.0 - 46.0 %  I-stat troponin, ED  Result Value Ref Range   Troponin i, poc 0.01 0.00 - 0.08 ng/mL   Comment 3           Ct Angio Head W Or Wo Contrast Result Date: 04/01/2016 CLINICAL DATA:  Severe headache and vertigo. Posterior head and neck pain radiating down the back and both arms for 2 weeks. Prior cervical spine surgery. EXAM: CT ANGIOGRAPHY HEAD AND NECK TECHNIQUE: Multidetector CT imaging of the head and neck was performed using the standard protocol during bolus administration of intravenous contrast. Multiplanar CT image reconstructions and MIPs were obtained to evaluate the vascular anatomy. Carotid stenosis measurements (when applicable) are obtained utilizing NASCET criteria, using the distal internal carotid diameter as the denominator. CONTRAST:  75 mL  Isovue 370 COMPARISON:  Head CT 03/27/2014. Cervical spine MRI 05/03/2013. Cervical spine CT 02/03/2013. Head MRI 09/20/2012. FINDINGS: CT HEAD Brain: There is no evidence of acute cortical infarct, intracranial hemorrhage, mass, midline shift, or extra-axial fluid collection. The ventricles and sulci are normal. Calvarium and skull base: Unremarkable. Paranasal sinuses: Clear. Orbits: Unremarkable. CTA NECK Aortic arch: 3 vessel aortic arch. Brachiocephalic and subclavian arteries are widely patent. Right carotid system: Patent without evidence of stenosis or dissection. Minimal atherosclerotic plaque in the proximal ICA. Left carotid system: Patent without evidence of stenosis or dissection. Mild atherosclerotic plaque about the carotid bifurcation. Vertebral arteries: Patent without evidence of stenosis or dissection. Codominant. Skeleton: Chronic, slightly depressed fracture of the anterior wall of the right maxillary sinus. Solid C5-C7 ACDF. Uncovertebral spurring results in moderate right neural foraminal stenosis at C3-4, severe right neural foraminal stenosis at C4-5, and moderate right greater than left foraminal stenosis at C7-T1. Other neck: No neck mass. CTA HEAD Anterior circulation: The internal carotid arteries are patent from skullbase to carotid termini without stenosis. There is a 2 mm aneurysm projecting anteriorly from the proximal right cavernous ICA (series 12, image 84). ACAs and MCAs are patent without evidence of major branch occlusion or significant stenosis. The left A1 segment is dominant. Posterior circulation: Intracranial vertebral arteries are patent to the basilar. There is mild non stenotic plaque in the distal left vertebral artery. PICA and SCA origins are patent. Basilar artery is widely patent. There is a patent right posterior communicating artery, and the right P1 segment is hypoplastic. PCAs are patent without evidence of significant proximal stenosis. Venous  sinuses: Patent.  Anatomic variants: Hypoplastic right A1 and right P1 segments. Delayed phase: No abnormal enhancement. IMPRESSION: 1. Mild extracranial atherosclerosis. No evidence of dissection or stenosis. 2. No significant intracranial arterial stenosis or major branch occlusion. 3. 2 mm right cavernous ICA aneurysm. 4. Solid C5-C7 ACDF. Moderate to severe multilevel neural foraminal stenosis. Electronically Signed   By: Logan Bores M.D.   On: 04/01/2016 12:01  Ct Angio Neck W/cm &/or Wo/cm Result Date: 04/01/2016 CLINICAL DATA:  Severe headache and vertigo. Posterior head and neck pain radiating down the back and both arms for 2 weeks. Prior cervical spine surgery. EXAM: CT ANGIOGRAPHY HEAD AND NECK TECHNIQUE: Multidetector CT imaging of the head and neck was performed using the standard protocol during bolus administration of intravenous contrast. Multiplanar CT image reconstructions and MIPs were obtained to evaluate the vascular anatomy. Carotid stenosis measurements (when applicable) are obtained utilizing NASCET criteria, using the distal internal carotid diameter as the denominator. CONTRAST:  75 mL Isovue 370 COMPARISON:  Head CT 03/27/2014. Cervical spine MRI 05/03/2013. Cervical spine CT 02/03/2013. Head MRI 09/20/2012. FINDINGS: CT HEAD Brain: There is no evidence of acute cortical infarct, intracranial hemorrhage, mass, midline shift, or extra-axial fluid collection. The ventricles and sulci are normal. Calvarium and skull base: Unremarkable. Paranasal sinuses: Clear. Orbits: Unremarkable. CTA NECK Aortic arch: 3 vessel aortic arch. Brachiocephalic and subclavian arteries are widely patent. Right carotid system: Patent without evidence of stenosis or dissection. Minimal atherosclerotic plaque in the proximal ICA. Left carotid system: Patent without evidence of stenosis or dissection. Mild atherosclerotic plaque about the carotid bifurcation. Vertebral arteries: Patent without evidence of stenosis or dissection.  Codominant. Skeleton: Chronic, slightly depressed fracture of the anterior wall of the right maxillary sinus. Solid C5-C7 ACDF. Uncovertebral spurring results in moderate right neural foraminal stenosis at C3-4, severe right neural foraminal stenosis at C4-5, and moderate right greater than left foraminal stenosis at C7-T1. Other neck: No neck mass. CTA HEAD Anterior circulation: The internal carotid arteries are patent from skullbase to carotid termini without stenosis. There is a 2 mm aneurysm projecting anteriorly from the proximal right cavernous ICA (series 12, image 84). ACAs and MCAs are patent without evidence of major branch occlusion or significant stenosis. The left A1 segment is dominant. Posterior circulation: Intracranial vertebral arteries are patent to the basilar. There is mild non stenotic plaque in the distal left vertebral artery. PICA and SCA origins are patent. Basilar artery is widely patent. There is a patent right posterior communicating artery, and the right P1 segment is hypoplastic. PCAs are patent without evidence of significant proximal stenosis. Venous sinuses: Patent. Anatomic variants: Hypoplastic right A1 and right P1 segments. Delayed phase: No abnormal enhancement. IMPRESSION: 1. Mild extracranial atherosclerosis. No evidence of dissection or stenosis. 2. No significant intracranial arterial stenosis or major branch occlusion. 3. 2 mm right cavernous ICA aneurysm. 4. Solid C5-C7 ACDF. Moderate to severe multilevel neural foraminal stenosis. Electronically Signed   By: Logan Bores M.D.   On: 04/01/2016 12:01  Mr Brain Wo Contrast Result Date: 04/01/2016 CLINICAL DATA:  Vertebrobasilar insufficiency. Evaluation for posterior stroke. EXAM: MRI HEAD WITHOUT CONTRAST TECHNIQUE: Multiplanar, multiecho pulse sequences of the brain and surrounding structures were obtained without intravenous contrast. COMPARISON:  Head CTA 04/01/2012.  Head MRI 09/20/2012. FINDINGS: There is no  evidence of acute infarct, intracranial hemorrhage, mass, midline shift, or extra-axial fluid collection. The ventricles and sulci are normal. A few punctate foci of T2 hyperintensity in the cerebral white matter are  unchanged, nonspecific, and not greater than expected for patient's age. The brainstem and cerebellum are normal. Orbits are unremarkable. Paranasal sinuses and mastoid air cells are clear. Major intracranial vascular flow voids are preserved. No suspicious osseous lesion. IMPRESSION: Negative brain MRI. Electronically Signed   By: Logan Bores M.D.   On: 04/01/2016 16:36  Mr Cervical Spine Wo Contrast Result Date: 04/01/2016 CLINICAL DATA:  Cervical radiculopathy. EXAM: MRI CERVICAL SPINE WITHOUT CONTRAST TECHNIQUE: Multiplanar, multisequence MR imaging of the cervical spine was performed. No intravenous contrast was administered. COMPARISON:  MRI 04/13/2013 FINDINGS: Image quality degraded by motion. Alignment: Slight retrolisthesis C4-5 as noted previously. Vertebrae: ACDF with metal plate and screws at 075-GRM and C6-7. Negative for fracture or mass lesion. Cord: Cord evaluation limited due to motion. No cord signal abnormality is detected. Posterior Fossa, vertebral arteries, paraspinal tissues: Negative Disc levels: C2-3:  Negative C3-4: Disc degeneration and spondylosis. Diffuse uncinate spurring causing mild spinal stenosis and mild foraminal stenosis bilaterally. C4-5: Mild retrolisthesis. Disc degeneration and spondylosis. Diffuse uncinate spurring causing mild spinal stenosis and moderate foraminal stenosis bilaterally. No change from the prior study. C5-6: Interbody fusion without stenosis. C6-7:  Interbody fusion without stenosis C7-T1: Moderate disc degeneration with diffuse uncinate spurring. Mild spinal stenosis and moderate foraminal stenosis bilaterally unchanged T1-2:  Left foraminal encroachment due to spurring unchanged IMPRESSION: Chronic degenerative changes in the cervical spine  similar to the prior study. No superimposed acute abnormality ACDF C5-6 and C6-7 unchanged from the prior study. Electronically Signed   By: Franchot Gallo M.D.   On: 04/01/2016 16:49   1000:  Pt insistent she was "sent here to get admitted." States she was told "the bones in my neck are disintegrating and plate in my neck is pressing up against the nerves and arteries." MRI and CT scans as above, explained to pt. Pt with vertigo dating back to at least 2012 (per [facility] records), and with thorough workup 2 weeks ago for these symptoms. Pt denies any new symptoms or change in her symptoms over the past 2 to 4 weeks, other that "they're still there." Pt states she has been taking antivert, but rx on file is a limited supply without refills from 3 years ago. T/C to Dr. Gerarda Fraction, case discussed, including:  HPI, pertinent PM/SHx, VS/PE, dx testing, ED course and treatment:  States he did not send pt to ED for admission, pt drove herself to his office, pt can be tx symptomatically, he just rx oxycodone so no further narcotics and he questions if pt's symptoms are due to the multiple sedating medications she is currently taking, pt can f/u in ofc and with ENT MD.  This conversation d/w pt; verb understanding. Pt made aware she will receive a dose of her usual pain med, as well as valium (which she noted improvement of her vertigo during her previous ED visit).   1120:  Pt is not orthostatic on VS. Pt has ambulated with steady gait, easy resps, NAD. Neuro exam remains intact and unchanged since previous visit, no new/changed symptoms, and pt had extensive workup 2 weeks ago; no indication for repeat imaging at this time.  Pt states she wants to go home now and "wouldn't have come here then if you were just going to give me the medicine I take at home!" Dx and testing d/w pt.  Questions answered.  Verb understanding, agreeable to d/c home with outpt f/u.     Final Clinical Impressions(s) / ED Diagnoses  Final diagnoses:  None    New Prescriptions New Prescriptions   No medications on file     Francine Graven, DO 04/20/16 1638

## 2016-04-16 NOTE — Discharge Instructions (Signed)
Take the prescription as directed.  Call your regular medical doctor today to schedule a follow up appointment within the next 3 days. Call the ENT doctor today to schedule a follow up appointment for next week.  Return to the Emergency Department immediately sooner if worsening.

## 2016-04-16 NOTE — ED Notes (Signed)
Patient was dizzy upon standing.

## 2016-04-20 DIAGNOSIS — M542 Cervicalgia: Secondary | ICD-10-CM | POA: Diagnosis not present

## 2016-04-20 DIAGNOSIS — Z6830 Body mass index (BMI) 30.0-30.9, adult: Secondary | ICD-10-CM | POA: Diagnosis not present

## 2016-04-20 DIAGNOSIS — M47812 Spondylosis without myelopathy or radiculopathy, cervical region: Secondary | ICD-10-CM | POA: Diagnosis not present

## 2016-04-20 DIAGNOSIS — M5030 Other cervical disc degeneration, unspecified cervical region: Secondary | ICD-10-CM | POA: Diagnosis not present

## 2016-04-20 DIAGNOSIS — R42 Dizziness and giddiness: Secondary | ICD-10-CM | POA: Diagnosis not present

## 2016-05-10 ENCOUNTER — Emergency Department (HOSPITAL_COMMUNITY): Payer: Medicare Other

## 2016-05-10 ENCOUNTER — Inpatient Hospital Stay (HOSPITAL_COMMUNITY)
Admission: EM | Admit: 2016-05-10 | Discharge: 2016-05-15 | DRG: 546 | Disposition: A | Discharge status: Home / Self Care | Payer: Medicare Other | Attending: Student in an Organized Health Care Education/Training Program | Admitting: Student in an Organized Health Care Education/Training Program

## 2016-05-10 ENCOUNTER — Inpatient Hospital Stay (HOSPITAL_COMMUNITY): Payer: Medicare Other

## 2016-05-10 ENCOUNTER — Encounter (HOSPITAL_COMMUNITY): Payer: Self-pay | Admitting: Emergency Medicine

## 2016-05-10 DIAGNOSIS — M609 Myositis, unspecified: Secondary | ICD-10-CM | POA: Diagnosis present

## 2016-05-10 DIAGNOSIS — M545 Low back pain: Secondary | ICD-10-CM | POA: Diagnosis present

## 2016-05-10 DIAGNOSIS — F17210 Nicotine dependence, cigarettes, uncomplicated: Secondary | ICD-10-CM | POA: Diagnosis present

## 2016-05-10 DIAGNOSIS — Z23 Encounter for immunization: Secondary | ICD-10-CM

## 2016-05-10 DIAGNOSIS — Z91048 Other nonmedicinal substance allergy status: Secondary | ICD-10-CM

## 2016-05-10 DIAGNOSIS — K3184 Gastroparesis: Secondary | ICD-10-CM | POA: Diagnosis present

## 2016-05-10 DIAGNOSIS — E271 Primary adrenocortical insufficiency: Secondary | ICD-10-CM | POA: Diagnosis present

## 2016-05-10 DIAGNOSIS — I509 Heart failure, unspecified: Secondary | ICD-10-CM | POA: Diagnosis present

## 2016-05-10 DIAGNOSIS — Z79899 Other long term (current) drug therapy: Secondary | ICD-10-CM | POA: Diagnosis not present

## 2016-05-10 DIAGNOSIS — Z882 Allergy status to sulfonamides status: Secondary | ICD-10-CM

## 2016-05-10 DIAGNOSIS — Z880 Allergy status to penicillin: Secondary | ICD-10-CM

## 2016-05-10 DIAGNOSIS — Z981 Arthrodesis status: Secondary | ICD-10-CM

## 2016-05-10 DIAGNOSIS — G8929 Other chronic pain: Secondary | ICD-10-CM | POA: Diagnosis not present

## 2016-05-10 DIAGNOSIS — M3320 Polymyositis, organ involvement unspecified: Principal | ICD-10-CM | POA: Diagnosis present

## 2016-05-10 DIAGNOSIS — A419 Sepsis, unspecified organism: Secondary | ICD-10-CM | POA: Diagnosis not present

## 2016-05-10 DIAGNOSIS — E876 Hypokalemia: Secondary | ICD-10-CM | POA: Diagnosis not present

## 2016-05-10 DIAGNOSIS — Z90710 Acquired absence of both cervix and uterus: Secondary | ICD-10-CM

## 2016-05-10 DIAGNOSIS — M6088 Other myositis, other site: Secondary | ICD-10-CM | POA: Diagnosis not present

## 2016-05-10 DIAGNOSIS — I2510 Atherosclerotic heart disease of native coronary artery without angina pectoris: Secondary | ICD-10-CM | POA: Diagnosis present

## 2016-05-10 DIAGNOSIS — Z8674 Personal history of sudden cardiac arrest: Secondary | ICD-10-CM

## 2016-05-10 DIAGNOSIS — Z91030 Bee allergy status: Secondary | ICD-10-CM

## 2016-05-10 DIAGNOSIS — J9811 Atelectasis: Secondary | ICD-10-CM | POA: Diagnosis present

## 2016-05-10 DIAGNOSIS — M549 Dorsalgia, unspecified: Secondary | ICD-10-CM | POA: Diagnosis not present

## 2016-05-10 DIAGNOSIS — K219 Gastro-esophageal reflux disease without esophagitis: Secondary | ICD-10-CM | POA: Diagnosis present

## 2016-05-10 DIAGNOSIS — E785 Hyperlipidemia, unspecified: Secondary | ICD-10-CM | POA: Diagnosis present

## 2016-05-10 DIAGNOSIS — Z885 Allergy status to narcotic agent status: Secondary | ICD-10-CM

## 2016-05-10 DIAGNOSIS — E039 Hypothyroidism, unspecified: Secondary | ICD-10-CM | POA: Diagnosis not present

## 2016-05-10 DIAGNOSIS — D72829 Elevated white blood cell count, unspecified: Secondary | ICD-10-CM | POA: Diagnosis not present

## 2016-05-10 DIAGNOSIS — R509 Fever, unspecified: Secondary | ICD-10-CM | POA: Diagnosis not present

## 2016-05-10 DIAGNOSIS — M542 Cervicalgia: Secondary | ICD-10-CM | POA: Diagnosis present

## 2016-05-10 DIAGNOSIS — I10 Essential (primary) hypertension: Secondary | ICD-10-CM | POA: Diagnosis not present

## 2016-05-10 DIAGNOSIS — I110 Hypertensive heart disease with heart failure: Secondary | ICD-10-CM | POA: Diagnosis present

## 2016-05-10 DIAGNOSIS — F419 Anxiety disorder, unspecified: Secondary | ICD-10-CM | POA: Diagnosis present

## 2016-05-10 DIAGNOSIS — Z881 Allergy status to other antibiotic agents status: Secondary | ICD-10-CM

## 2016-05-10 DIAGNOSIS — Z8249 Family history of ischemic heart disease and other diseases of the circulatory system: Secondary | ICD-10-CM | POA: Diagnosis not present

## 2016-05-10 DIAGNOSIS — R400 Somnolence: Secondary | ICD-10-CM | POA: Diagnosis present

## 2016-05-10 DIAGNOSIS — M79605 Pain in left leg: Secondary | ICD-10-CM | POA: Diagnosis present

## 2016-05-10 DIAGNOSIS — T424X5A Adverse effect of benzodiazepines, initial encounter: Secondary | ICD-10-CM | POA: Diagnosis present

## 2016-05-10 DIAGNOSIS — Z7982 Long term (current) use of aspirin: Secondary | ICD-10-CM | POA: Diagnosis not present

## 2016-05-10 DIAGNOSIS — M25552 Pain in left hip: Secondary | ICD-10-CM | POA: Diagnosis not present

## 2016-05-10 DIAGNOSIS — Z6829 Body mass index (BMI) 29.0-29.9, adult: Secondary | ICD-10-CM

## 2016-05-10 DIAGNOSIS — M4806 Spinal stenosis, lumbar region: Secondary | ICD-10-CM | POA: Diagnosis not present

## 2016-05-10 DIAGNOSIS — M25559 Pain in unspecified hip: Secondary | ICD-10-CM | POA: Diagnosis not present

## 2016-05-10 LAB — URINALYSIS, ROUTINE W REFLEX MICROSCOPIC
Bilirubin Urine: NEGATIVE
Glucose, UA: NEGATIVE mg/dL
Ketones, ur: 15 mg/dL — AB
Leukocytes, UA: NEGATIVE
Nitrite: NEGATIVE
Protein, ur: 30 mg/dL — AB
Specific Gravity, Urine: 1.019 (ref 1.005–1.030)
pH: 7 (ref 5.0–8.0)

## 2016-05-10 LAB — CBC WITH DIFFERENTIAL/PLATELET
Basophils Absolute: 0 10*3/uL (ref 0.0–0.1)
Basophils Relative: 0 %
Eosinophils Absolute: 0.1 10*3/uL (ref 0.0–0.7)
Eosinophils Relative: 1 %
HCT: 43.4 % (ref 36.0–46.0)
Hemoglobin: 14.4 g/dL (ref 12.0–15.0)
Lymphocytes Relative: 11 %
Lymphs Abs: 1.9 10*3/uL (ref 0.7–4.0)
MCH: 28.3 pg (ref 26.0–34.0)
MCHC: 33.2 g/dL (ref 30.0–36.0)
MCV: 85.3 fL (ref 78.0–100.0)
Monocytes Absolute: 1.1 10*3/uL — ABNORMAL HIGH (ref 0.1–1.0)
Monocytes Relative: 6 %
Neutro Abs: 14.8 10*3/uL — ABNORMAL HIGH (ref 1.7–7.7)
Neutrophils Relative %: 82 %
Platelets: 342 10*3/uL (ref 150–400)
RBC: 5.09 MIL/uL (ref 3.87–5.11)
RDW: 13.9 % (ref 11.5–15.5)
WBC: 17.9 10*3/uL — ABNORMAL HIGH (ref 4.0–10.5)

## 2016-05-10 LAB — HEPATIC FUNCTION PANEL
ALT: 31 U/L (ref 14–54)
AST: 64 U/L — ABNORMAL HIGH (ref 15–41)
Albumin: 3.8 g/dL (ref 3.5–5.0)
Alkaline Phosphatase: 85 U/L (ref 38–126)
Bilirubin, Direct: 0.5 mg/dL (ref 0.1–0.5)
Indirect Bilirubin: 1.6 mg/dL — ABNORMAL HIGH (ref 0.3–0.9)
Total Bilirubin: 2.1 mg/dL — ABNORMAL HIGH (ref 0.3–1.2)
Total Protein: 7 g/dL (ref 6.5–8.1)

## 2016-05-10 LAB — BASIC METABOLIC PANEL
Anion gap: 12 (ref 5–15)
BUN: 10 mg/dL (ref 6–20)
CO2: 25 mmol/L (ref 22–32)
Calcium: 9.2 mg/dL (ref 8.9–10.3)
Chloride: 97 mmol/L — ABNORMAL LOW (ref 101–111)
Creatinine, Ser: 0.95 mg/dL (ref 0.44–1.00)
GFR calc Af Amer: >60 mL/min (ref >60)
GFR calc non Af Amer: >60 mL/min (ref >60)
Glucose, Bld: 99 mg/dL (ref 65–99)
Potassium: 4.6 mmol/L (ref 3.5–5.1)
Sodium: 134 mmol/L — ABNORMAL LOW (ref 135–145)

## 2016-05-10 LAB — URINE MICROSCOPIC-ADD ON: Squamous Epithelial / LPF: NONE SEEN

## 2016-05-10 LAB — RAPID URINE DRUG SCREEN, HOSP PERFORMED
Amphetamines: POSITIVE — AB
Barbiturates: NOT DETECTED
Benzodiazepines: POSITIVE — AB
Cocaine: NOT DETECTED
Opiates: POSITIVE — AB
Tetrahydrocannabinol: NOT DETECTED

## 2016-05-10 LAB — ACETAMINOPHEN LEVEL: Acetaminophen (Tylenol), Serum: <10 ug/mL — ABNORMAL LOW (ref 10–30)

## 2016-05-10 LAB — ETHANOL: Alcohol, Ethyl (B): <5 mg/dL (ref <5)

## 2016-05-10 LAB — I-STAT CG4 LACTIC ACID, ED
Lactic Acid, Venous: 2.8 mmol/L (ref 0.5–1.9)
Lactic Acid, Venous: 0.62 mmol/L (ref 0.5–1.9)

## 2016-05-10 MED ORDER — DEXTROSE 5 % IV SOLN
1.0000 g | Freq: Three times a day (TID) | INTRAVENOUS | Status: DC
  Filled 2016-05-10 (×3): qty 1

## 2016-05-10 MED ORDER — LEVOFLOXACIN IN D5W 750 MG/150ML IV SOLN
750.0000 mg | Freq: Once | INTRAVENOUS | Status: AC
  Administered 2016-05-10: 750 mg via INTRAVENOUS
  Filled 2016-05-10: qty 150

## 2016-05-10 MED ORDER — NALOXONE HCL 0.4 MG/ML IJ SOLN
0.4000 mg | Freq: Once | INTRAMUSCULAR | Status: AC
  Administered 2016-05-10: 0.4 mg via INTRAVENOUS
  Filled 2016-05-10: qty 1

## 2016-05-10 MED ORDER — HYDROCORTISONE 10 MG PO TABS
10.0000 mg | ORAL_TABLET | Freq: Every day | ORAL | Status: DC
  Administered 2016-05-10 – 2016-05-14 (×5): 10 mg via ORAL
  Filled 2016-05-10 (×6): qty 1

## 2016-05-10 MED ORDER — KETOROLAC TROMETHAMINE 30 MG/ML IJ SOLN
30.0000 mg | Freq: Four times a day (QID) | INTRAMUSCULAR | Status: AC | PRN
  Administered 2016-05-10 – 2016-05-11 (×3): 30 mg via INTRAVENOUS
  Filled 2016-05-10 (×3): qty 1

## 2016-05-10 MED ORDER — HYDROCORTISONE 20 MG PO TABS
20.0000 mg | ORAL_TABLET | Freq: Every day | ORAL | Status: DC
  Administered 2016-05-11 – 2016-05-15 (×5): 20 mg via ORAL
  Filled 2016-05-10 (×5): qty 1

## 2016-05-10 MED ORDER — VANCOMYCIN HCL IN DEXTROSE 1-5 GM/200ML-% IV SOLN
1000.0000 mg | Freq: Two times a day (BID) | INTRAVENOUS | Status: DC
  Administered 2016-05-10: 1000 mg via INTRAVENOUS
  Filled 2016-05-10 (×2): qty 200

## 2016-05-10 MED ORDER — PNEUMOCOCCAL VAC POLYVALENT 25 MCG/0.5ML IJ INJ
0.5000 mL | INJECTION | INTRAMUSCULAR | Status: AC
Start: 2016-05-11 — End: 2016-05-11
  Administered 2016-05-11: 0.5 mL via INTRAMUSCULAR
  Filled 2016-05-10: qty 0.5

## 2016-05-10 MED ORDER — DEXTROSE 5 % IV SOLN
2.0000 g | Freq: Two times a day (BID) | INTRAVENOUS | Status: DC
  Administered 2016-05-10 – 2016-05-11 (×2): 2 g via INTRAVENOUS
  Filled 2016-05-10 (×3): qty 2

## 2016-05-10 MED ORDER — LEVOTHYROXINE SODIUM 75 MCG PO TABS
75.0000 ug | ORAL_TABLET | Freq: Every day | ORAL | Status: DC
  Administered 2016-05-11 – 2016-05-15 (×5): 75 ug via ORAL
  Filled 2016-05-10 (×5): qty 1

## 2016-05-10 MED ORDER — ACETAMINOPHEN 650 MG RE SUPP: 650.0000 mg | Freq: Four times a day (QID) | RECTAL | Status: DC | PRN

## 2016-05-10 MED ORDER — VANCOMYCIN HCL IN DEXTROSE 1-5 GM/200ML-% IV SOLN
1000.0000 mg | Freq: Once | INTRAVENOUS | Status: AC
  Administered 2016-05-10: 1000 mg via INTRAVENOUS
  Filled 2016-05-10: qty 200

## 2016-05-10 MED ORDER — SODIUM CHLORIDE 0.9 % IV SOLN
INTRAVENOUS | Status: AC
  Administered 2016-05-10: 16:00:00 via INTRAVENOUS

## 2016-05-10 MED ORDER — ENOXAPARIN SODIUM 40 MG/0.4ML ~~LOC~~ SOLN
40.0000 mg | SUBCUTANEOUS | Status: DC
  Administered 2016-05-10 – 2016-05-14 (×5): 40 mg via SUBCUTANEOUS
  Filled 2016-05-10 (×5): qty 0.4

## 2016-05-10 MED ORDER — DEXTROSE 5 % IV SOLN
2.0000 g | Freq: Once | INTRAVENOUS | Status: AC
  Administered 2016-05-10: 2 g via INTRAVENOUS
  Filled 2016-05-10: qty 2

## 2016-05-10 MED ORDER — NITROGLYCERIN 0.4 MG SL SUBL: 0.4000 mg | SUBLINGUAL_TABLET | SUBLINGUAL | Status: DC | PRN

## 2016-05-10 MED ORDER — ACETAMINOPHEN 325 MG PO TABS
650.0000 mg | ORAL_TABLET | Freq: Four times a day (QID) | ORAL | Status: DC | PRN
  Administered 2016-05-11: 650 mg via ORAL
  Filled 2016-05-10: qty 2

## 2016-05-10 MED ORDER — SODIUM CHLORIDE 0.9 % IV BOLUS (SEPSIS)
1400.0000 mL | Freq: Once | INTRAVENOUS | Status: AC
  Administered 2016-05-10: 1400 mL via INTRAVENOUS

## 2016-05-10 MED ORDER — LEVOFLOXACIN IN D5W 750 MG/150ML IV SOLN
750.0000 mg | INTRAVENOUS | Status: DC
  Filled 2016-05-10: qty 150

## 2016-05-10 MED ORDER — CYCLOBENZAPRINE HCL 10 MG PO TABS
10.0000 mg | ORAL_TABLET | Freq: Three times a day (TID) | ORAL | Status: DC | PRN
  Administered 2016-05-10 – 2016-05-13 (×5): 10 mg via ORAL
  Filled 2016-05-10 (×5): qty 1

## 2016-05-10 MED ORDER — SODIUM CHLORIDE 0.9% FLUSH
3.0000 mL | Freq: Two times a day (BID) | INTRAVENOUS | Status: DC
  Administered 2016-05-10 – 2016-05-13 (×7): 3 mL via INTRAVENOUS

## 2016-05-10 MED ORDER — SODIUM CHLORIDE 0.9 % IV BOLUS (SEPSIS)
1000.0000 mL | Freq: Once | INTRAVENOUS | Status: AC
Start: 2016-05-10 — End: 2016-05-10
  Administered 2016-05-10: 1000 mL via INTRAVENOUS

## 2016-05-10 MED ORDER — ASPIRIN 81 MG PO CHEW
81.0000 mg | CHEWABLE_TABLET | Freq: Every day | ORAL | Status: DC
  Administered 2016-05-11 – 2016-05-15 (×5): 81 mg via ORAL
  Filled 2016-05-10 (×5): qty 1

## 2016-05-10 NOTE — ED Triage Notes (Signed)
Per PT[facility], pt woke up at 5am walked to the bathroom then began to complain of Left hip pain.  No fall noted, no swelling, obvious deformities, tenderness.  She was able to walk to the stretcher w/ assistance.  It should be noted that she did take some of her morning meds but not sure which ones (she is pretty sure she took her pain meds.).

## 2016-05-10 NOTE — Progress Notes (Signed)
NURSING PROGRESS NOTE  NONYA PAHL [id number] Admission Data: 05/10/2016 2:30 PM Attending Provider: Axel Filler, MD [id number]., MD Code Status: FULL  NYOTA BRUMMELL is a 50 y.o. female patient admitted from ED:  -No acute distress noted.  -No complaints of shortness of breath.  -No complaints of chest pain.   Cardiac Monitoring: Box # 26 in place. Cardiac monitor yields:normal sinus rhythm.  Blood pressure 150/92, pulse 96, temperature 99.2 F (37.3 C), temperature source Oral, resp. rate 16, height 5\' 5"  (1.651 m), weight 81.6 kg (180 lb), SpO2 95 %.   IV Fluids:  IV in place, occlusive dsg intact without redness, IV cath hand right, condition patent and no redness, wrist left, condition patent normal saline.   Allergies:  Bee venom; Doxycycline; Erythromycin; Ibuprofen; Morphine and related; Penicillins; Sulfa antibiotics; Cephalexin; Dust mite extract; Tramadol; Other; and Sulfonamide derivatives  Past Medical History:   has a past medical history of Adrenal insufficiency ([place]); Anxiety; Astigmatism; CAD (coronary artery disease); Cardiac arrest ([place]); Cardiomyopathy; Chest pain; CHF (congestive heart failure) ([place]); Chronic back pain; Chronic diarrhea; Concussion; Gastroparesis; HTN (hypertension); Hyperlipidemia; Hypothyroidism; Mitral valve prolapse; Nondiabetic gastroparesis; QT prolongation; and Tobacco abuse.  Past Surgical History:   has a past surgical history that includes Cholecystectomy; Spine surgery; Vesicovaginal fistula closure w/ TAH; Varicose vein surgery; Abdominal hysterectomy; Cardiac catheterization (03/2007); and left heart catheterization with coronary angiogram (N/A, 04/13/2012).  Social History:   reports that she has been smoking Cigarettes.  She has a 5.00 pack-year smoking history. She has never used smokeless tobacco. She reports that she does not drink alcohol or use drugs.  Skin: Intact  Patient/Family orientated to room.  Information packet given to patient/family. Admission inpatient armband information verified with patient/family to include name and date of birth and placed on patient arm. Side rails up x 2, fall assessment and education completed with patient/family. Patient/family able to verbalize understanding of risk associated with falls and verbalized understanding to call for assistance before getting out of bed. Call light within reach. Patient/family able to voice and demonstrate understanding of unit orientation instructions.    Will continue to evaluate and treat per MD orders.  Charolette Child, RN

## 2016-05-10 NOTE — Progress Notes (Addendum)
Second Addendum:  Aztreonam and levofloxacin switched to cefepime 2g q12h.  Demetrius Charity, PharmD Acute Care Pharmacy Resident  Pager: [phone] 05/10/2016  Addendum: SCr 0.95/est CrCl ~70-32mL/min WBC 17.9, LA 2.8  Plan:  Vancomycin 1g IV every 12 hours Levaquin 750mg  IV every 24 hours Aztreonam 1g IV every 8 hours Consider switching to cephalosporin - tolerated without swelling in past.   Sloan Leiter, PharmD, BCPS Clinical Pharmacist [phone] 05/10/2016, 9:56 AM ---  Pharmacy Antibiotic Note  [name] is a 50 y.o. female admitted on 05/10/2016 with possible sepsis-source currently unknown & penicillin and sulfa allergy.  Pharmacy has been consulted for Vancomycin, Aztreonam, and Levaquin dosing. Note that patient has had cephalosporins in past - only noted to have side effect of diarrhea.   First doses ordered in ED- and being administered. Admit complaint was L-hip pain but noted to have fever in ED with Tmax 101.4. BP, HR, and RR are wnl. CBC & BMET pending. Blood cultures collected. Urine pending.   Plan:  First doses as ordered: Vancomycin 1g, Aztreonam 2g and Levaquin 750mg  x1 each. Further doses pending lab results.   Consider switching to cephalosporin for gram negative coverage if antibiotics to continue.   Height: 5\' 5"  (165.1 cm) Weight: 180 lb (81.6 kg) IBW/kg (Calculated) : 57  Temp (24hrs), Avg:100.7 F (38.2 C), Min:100 F (37.8 C), Max:101.4 F (38.6 C)  No results for input(s): WBC, CREATININE, LATICACIDVEN, VANCOTROUGH, VANCOPEAK, VANCORANDOM, GENTTROUGH, GENTPEAK, GENTRANDOM, TOBRATROUGH, TOBRAPEAK, TOBRARND, AMIKACINPEAK, AMIKACINTROU, AMIKACIN in the last 168 hours.  CrCl cannot be calculated (Patient's most recent lab result is older than the maximum 21 days allowed.).    Allergies  Allergen Reactions  . Bee Venom Anaphylaxis  . Doxycycline Other (See Comments) and Nausea Only    Due to Pre-Existing conditions involved with stomach, patient  does not take the following medication  . Erythromycin Other (See Comments) and Nausea And Vomiting    Due to Pre-Existing conditions involved with stomach, patient does not take the following medication  . Ibuprofen Nausea Only    gastroparesis gastroparesis  . Morphine And Related Itching  . Penicillins Anaphylaxis and Swelling    Has patient had a PCN reaction causing immediate rash, facial/tongue/throat swelling, SOB or lightheadedness with hypotension: Yes Has patient had a PCN reaction causing severe rash involving mucus membranes or skin necrosis: Yes Has patient had a PCN reaction that required hospitalization No Has patient had a PCN reaction occurring within the last 10 years: No If all of the above answers are "NO", then may proceed with Cephalosporin use.   . Sulfa Antibiotics Hives and Itching  . Cephalexin Diarrhea  . Cephalexin Diarrhea  . Dust Mite Extract Hives and Swelling  . Morphine Hives  . Tramadol Nausea Only and Nausea And Vomiting    Diarrhea  . Other Hives, Itching and Swelling    Cats  . Sulfonamide Derivatives Itching    Antimicrobials this admission: Vanc 9/4 >> Levaquin 9/4 >> Aztreonam 9/4 >>  Dose adjustments this admission: n/a  Microbiology results: 9/4 BCx:  9/4 UCx:   Thank you for allowing pharmacy to be a part of this patient's care.  Sloan Leiter, PharmD, BCPS Clinical Pharmacist [phone] 05/10/2016 7:22 AM

## 2016-05-10 NOTE — H&P (Signed)
Date: 05/10/2016               Patient Name:  [name] MRN: [id number]  DOB: 1966-02-16 Age / Sex: 50 y.o., female   PCP: Redmond School, MD         Medical Service: Internal Medicine Teaching Service         Attending Physician: Dr. Axel Filler, MD    First Contact: Dr. Heber  Pager: [phone]  Second Contact: Dr. Quay Burow Pager: [phone]       After Hours (After 5p/  First Contact Pager: [phone]  weekends / holidays): Second Contact Pager: [phone]   Chief Complaint: Left-sided lower back pain radiating down the left leg to the knee  History of Present Illness: [name] is a 50 year old female with past medical history of chronic pain and hypothyroidism that presents with left-sided lower back pain.  Patient states she awoke this morning with pain in her back, left hip and left buttock. The pain is constant, radiates down the front and back of her left leg to the knee. She denies any recent falls or pain elsewhere. Patient is on chronic pain medication for chronic back and neck pain.  She states that the pain today is new and different from her chronic back pain. Patient appears mildly disoriented. She is able to state she is at [facility] in [place] and the year is 2017 but persists that she has been in the hospital for the past 2 days.  She keeps her eyes closed for most of the interview and appears lethargic. Patient denies headache, cough, shortness of breath, chest pain, abdominal pain, nausea or vomiting, myalgias, saddle paresthesia, bladder or bowel incontinence.  She admits to sore throat for the past 2 days which has gotten worse, fever and chills for the past 2 days. She denies any recent sick contacts.   Meds: Current Facility-Administered Medications  Medication Dose Route Frequency Provider Last Rate Last Dose  . 0.9 %  sodium chloride infusion   Intravenous Continuous Alexa Angela Burke, MD 125 mL/hr at 05/10/16 1605    . acetaminophen (TYLENOL) tablet  650 mg  650 mg Oral Q6H PRN Alexa Angela Burke, MD       Or  . acetaminophen (TYLENOL) suppository 650 mg  650 mg Rectal Q6H PRN Alexa Angela Burke, MD      . Derrill Memo ON 05/11/2016] aspirin chewable tablet 81 mg  81 mg Oral Daily Alexa R Burns, MD      . ceFEPIme (MAXIPIME) 2 g in dextrose 5 % 50 mL IVPB  2 g Intravenous Q12H Delane Ginger, RPH   2 g at 05/10/16 1625  . cyclobenzaprine (FLEXERIL) tablet 10 mg  10 mg Oral TID PRN Florinda Marker, MD   10 mg at 05/10/16 1605  . enoxaparin (LOVENOX) injection 40 mg  40 mg Subcutaneous Q24H Alexa R Burns, MD   40 mg at 05/10/16 1846  . hydrocortisone (CORTEF) tablet 10 mg  10 mg Oral QHS Wynell Balloon, RPH      . [START ON 05/11/2016] hydrocortisone (CORTEF) tablet 20 mg  20 mg Oral Daily Alexa R Burns, MD      . ketorolac (TORADOL) 30 MG/ML injection 30 mg  30 mg Intravenous Q6H PRN Alexa Angela Burke, MD   30 mg at 05/10/16 1605  . [START ON 05/11/2016] levothyroxine (SYNTHROID, LEVOTHROID) tablet 75 mcg  75 mcg Oral QAC breakfast Alexa Angela Burke, MD      .  nitroGLYCERIN (NITROSTAT) SL tablet 0.4 mg  0.4 mg Sublingual Q5 min PRN Alexa Angela Burke, MD      . Derrill Memo ON 05/11/2016] pneumococcal 23 valent vaccine (PNU-IMMUNE) injection 0.5 mL  0.5 mL Intramuscular Tomorrow-1000 Axel Filler, MD      . sodium chloride flush (NS) 0.9 % injection 3 mL  3 mL Intravenous Q12H Alexa Angela Burke, MD   3 mL at 05/10/16 1606  . vancomycin (VANCOCIN) IVPB 1000 mg/200 mL premix  1,000 mg Intravenous Q12H Lacretia Leigh, MD   1,000 mg at 05/10/16 2014    Allergies: Allergies as of 05/10/2016 - Review Complete 05/10/2016  Allergen Reaction Noted  . Bee venom Anaphylaxis 04/30/2011  . Doxycycline Other (See Comments) and Nausea Only 12/15/2010  . Erythromycin Other (See Comments) and Nausea And Vomiting 12/15/2010  . Ibuprofen Nausea Only 08/24/2013  . Morphine and related Itching 12/15/2010  . Penicillins Anaphylaxis and Swelling 01/08/2009  . Sulfa antibiotics Hives and Itching  02/04/2016  . Cephalexin Diarrhea 10/31/2011  . Dust mite extract Hives and Swelling 04/30/2011  . Tramadol Nausea Only and Nausea And Vomiting 03/26/2011  . Other Hives, Itching, and Swelling 09/16/2015  . Sulfonamide derivatives Itching 01/08/2009   Past Medical History:  Diagnosis Date  . Adrenal insufficiency ([place])    diagnosed 2012  . Anxiety   . Astigmatism   . CAD (coronary artery disease)    Cath 2008 EF normal. RCA 50-60, Septal 50%. Myoview 3/12: EF 53% normal perfusion  . Cardiac arrest ([place])    2/2 adissonian crisis  . Cardiomyopathy    resolved  . Chest pain    chronicc  . CHF (congestive heart failure) ([place])   . Chronic back pain   . Chronic diarrhea   . Concussion    sept 28th 2014  . Gastroparesis   . HTN (hypertension)   . Hyperlipidemia   . Hypothyroidism   . Mitral valve prolapse   . Nondiabetic gastroparesis   . QT prolongation   . Tobacco abuse    down to 2 cigarettes per day    Family History:  Family History  Problem Relation Age of Onset  . Cancer Father     Colon  . Coronary artery disease    . Heart attack Mother     Social History:  Tobacco Use: States she currently smokes 3 cigarettes a day.  Declines need for nicotine patch. Alcohol Use: Denies alcohol use Illicit Drug Use:  Denies any illicit drug use Other:  Lives at home with with daughter and son  Review of Systems: A complete ROS was negative except as per HPI.  General: positive for fever, chills, fatigue, denies unexpected weight loss, change in appetite and diaphoresis.  Respiratory: Denies SOB, cough, DOE, chest tightness, and wheezing.   Cardiovascular: Denies chest pain and palpitations.  Gastrointestinal: Denies nausea, vomiting, abdominal pain, diarrhea, constipation, and abdominal distention.  Genitourinary: Denies dysuria, urgency, frequency, hematuria, suprapubic pain and flank pain. Endocrine: Denies hot or cold intolerance, polyuria, and  polydipsia. Musculoskeletal: Denies myalgias, positive for back pain, denies joint swelling, arthralgias and positive for gait problem.  Skin: Denies pallor, rash and wounds.  Neurological: Denies dizziness, headaches, weakness, lightheadedness, numbness, seizures, and syncope. Psychiatric/Behavioral: Denies mood changes, positive for confusion, denies nervousness, sleep disturbance and agitation.  Physical Exam: Blood pressure (!) 146/84, pulse 97, temperature 98.3 F (36.8 C), temperature source Oral, resp. rate 15, height 5\' 5"  (1.651 m), weight 180 lb (81.6 kg), SpO2 92 %.  General: Vital signs reviewed.  Patient is well-developed and well-nourished, in acute distress but cooperative with exam.  Lips are chapped and oral mucosa appears dry.  Head: Normocephalic and atraumatic. Eyes: EOMI, conjunctivae normal, no scleral icterus.  Neck: Supple, trachea midline, normal ROM, no JVD Cardiovascular: RRR, S1 normal, S2 normal, no murmurs, gallops, or rubs. Pulmonary/Chest: Clear to auscultation bilaterally, no wheezes, rales, or rhonchi. Abdominal: Soft, non-tender, non-distended, BS +, no masses, organomegaly, or guarding present.  Musculoskeletal: No joint deformities or erythema.  Extremities: No lower extremity edema bilaterally,  pulses symmetric and intact bilaterally. No cyanosis or clubbing.  Neurological: A&O x3,  5/5 strength bilaterally in upper extremities.  0/5 strength in left lower extremity.  5/5 strength in right lower extremity. cranial nerve II-XII are grossly intact, no focal motor deficit, sensory intact to light touch bilaterally.  Skin: Warm, dry and intact. No rashes or erythema. Psychiatric: Normal mood and affect. speech and behavior is sluggish.   EKG: Normal sinus rhythm, no ST elevation or T wave inversion   CXR:  FINDINGS: Decreased lung volumes are seen with mild bibasilar atelectasis. No evidence of pulmonary consolidation or edema. No evidence of pneumothorax  or pleural effusion. Heart size is within normal limits. Cervical spine fusion hardware again noted.  IMPRESSION: Decreased lung volumes with mild bibasilar atelectasis.  Assessment & Plan by Problem: Active Problems: Right sided lower back pain Patient has a history of chronic back pain. Today she states that her pain radiates down her left leg. This type of pain is new for her.  She denies any saddle paresthesias, bladder and bowel incontinence. On admission patient had a leukocytosis of 17.9, a temperature of 101.4 and lactic acid of 2.8.  Patient was started on broad-spectrum antibiotics in the ED. And she is currently afebrile and lactic acid is 0.62.  The source of infection is unknown at this point.  There is concern that the infection may be a spinal abscess vs osteomyelitis.  Patient also has symptoms similar to sciatic pain and muscle spasms may be contributing to radiating left sided pain. - MRI of Lumbar spine with contrast - MRI left hip - Continue home pain medication - Flexeril 10mg  TID PRN  - Toradol 30mg /ml Q6 PRN   Fever/Leukocytosis In the ED patient had temperature of 101.4 leukocytosis of 17.9. Etiology is currently unknown.   Urinalysis showed rare bacteria, negative leukocytes, negative nitrites. Suspicion is low for urinary tract infection. Per ED provider patient arrived disoriented and Oxygen saturation was 88%.  Patient is on oxycodone 30 mg q4, Ambien 10 mg QD,  Ativan 2 mg Q6 at home and this was confirmed per the narcotic database. There was concern that patient had overdosed on her medications. She was given Narcan and per ED provider she responded to the Narcan and became more alert.  Patient had a 1 view portable chest x-ray with poor image showing decreased lung volumes with no evidence of pulmonary consolidation or edema or pleural effusion. Due to patient's inability to protect airway earlier there is suspicion for possible aspiration pneumonia as a source of  infection and will need to get a 2 view chest x-ray.   - Vancomycin and Cefepime - Tylenol 650mg  Q6 PRN - X-ray chest PA and lateral view. - Awaiting blood culture results - Awaiting urine culture   - CBC - CMP - IV NS 149ml/hr  Hypothyroidism  Stable, currently not complaining of cold intolerance or weight gain .  On levothyroxine 65mcg at  home - Continue home medication   Dispo: Admit patient to Observation with expected length of stay less than 2 midnights.  Signed:

## 2016-05-10 NOTE — ED Provider Notes (Signed)
[facility] DEPT Provider Note   CSN: [id number] Arrival date & time: 05/10/16  [id number]     History   Chief Complaint Chief Complaint  Patient presents with  . Hip Pain    HPI [name] is a 50 y.o. female.  HPI Patient presents to the emergency department by EMS with a complaint of hip and buttocks pain on arrival in the room.  The patient is somnolent and will not arouse other than to sternal rub.  She will not answer my questions.  There is a report that the patient did receive 30 mg of her oxycodone prior to EMS arrival.  The patient does have a rectal temperature 101.5.  Past Medical History:  Diagnosis Date  . Adrenal insufficiency ([place])    diagnosed 2012  . Anxiety   . Astigmatism   . CAD (coronary artery disease)    Cath 2008 EF normal. RCA 50-60, Septal 50%. Myoview 3/12: EF 53% normal perfusion  . Cardiac arrest ([place])    2/2 adissonian crisis  . Cardiomyopathy    resolved  . Chest pain    chronicc  . CHF (congestive heart failure) ([place])   . Chronic back pain   . Chronic diarrhea   . Concussion    sept 28th 2014  . Gastroparesis   . HTN (hypertension)   . Hyperlipidemia   . Hypothyroidism   . Mitral valve prolapse   . Nondiabetic gastroparesis   . QT prolongation   . Tobacco abuse    down to 2 cigarettes per day    Patient Active Problem List   Diagnosis Date Noted  . Fever 05/10/2016  . Anxiety 05/26/2015  . Tremors of nervous system 05/26/2015  . Neck pain 05/26/2015  . Chest pain 05/25/2015  . Hot flushes, perimenopausal 07/24/2013  . CAD (coronary artery disease) 04/11/2012  . GERD (gastroesophageal reflux disease) 04/11/2012  . Addison's disease ([place]) 03/03/2012  . Dizziness 12/15/2010  . QT prolongation 12/15/2010  . Palpitations 01/01/2009  . Hypothyroidism 12/31/2008  . Hyperlipidemia 12/31/2008  . OVERWEIGHT/OBESITY 12/31/2008  . Essential hypertension 12/31/2008  . CARDIOMYOPATHY, SECONDARY 12/31/2008  . CHEST  PAIN-UNSPECIFIED 12/31/2008    Past Surgical History:  Procedure Laterality Date  . ABDOMINAL HYSTERECTOMY    . CARDIAC CATHETERIZATION  03/2007   showed 60% lesion in the right coronary artery  . CHOLECYSTECTOMY    . LEFT HEART CATHETERIZATION WITH CORONARY ANGIOGRAM N/A 04/13/2012   Procedure: LEFT HEART CATHETERIZATION WITH CORONARY ANGIOGRAM;  Surgeon: Hillary Bow, MD;  Location: [facility] C[facility] LAB;  Service: Cardiovascular;  Laterality: N/A;  . SPINE SURGERY    . VARICOSE VEIN SURGERY    . VESICOVAGINAL FISTULA CLOSURE W/ TAH      OB History    Gravida Para Term Preterm AB Living   3 2 2  0 1 2   SAB TAB Ectopic Multiple Live Births   1               Home Medications    Prior to Admission medications   Medication Sig Start Date End Date Taking? Authorizing Provider  amLODipine (NORVASC) 5 MG tablet TAKE 1 TABLET (5 MG TOTAL) BY MOUTH DAILY. 07/25/15  Yes Pixie Casino, MD  aspirin 81 MG chewable tablet Chew 81 mg by mouth daily.    Yes Historical Provider, MD  cholecalciferol (VITAMIN D) 1000 UNITS tablet Take 1,000 Units by mouth daily.   Yes Historical Provider, MD  cyclobenzaprine (FLEXERIL) 10 MG tablet Take 10  mg by mouth 3 (three) times daily as needed for muscle spasms.    Yes Historical Provider, MD  diazepam (VALIUM) 10 MG tablet Take 10 mg by mouth 3 (three) times daily as needed for anxiety. #42 filled 04/26/16 04/26/16  Yes Historical Provider, MD  hydrocortisone (CORTEF) 10 MG tablet Take 10 mg by mouth See admin instructions. Take up to 6 tablets daily 04/07/16  Yes Historical Provider, MD  levothyroxine (SYNTHROID, LEVOTHROID) 75 MCG tablet Take 75 mcg by mouth daily before breakfast.   Yes Historical Provider, MD  LORazepam (ATIVAN) 2 MG tablet Take 2 mg by mouth 4 (four) times daily as needed for anxiety.   Yes Historical Provider, MD  meclizine (ANTIVERT) 25 MG tablet 1 or 2 tabs PO q8h prn dizziness Patient taking differently: Take 25-50 mg by mouth 4 (four)  times daily as needed. 1 or 2 tabs PO q8h prn dizzines 04/16/16  Yes Francine Graven, DO  nitroGLYCERIN (NITROSTAT) 0.4 MG SL tablet Place 0.4 mg under the tongue every 5 (five) minutes as needed for chest pain.   Yes Historical Provider, MD  oxycodone (ROXICODONE) 30 MG immediate release tablet Take 30 mg by mouth every 4 (four) hours as needed for pain.    Yes Historical Provider, MD  vitamin C (ASCORBIC ACID) 500 MG tablet Take 500 mg by mouth daily.   Yes Historical Provider, MD  zolpidem (AMBIEN) 10 MG tablet Take 10 mg by mouth at bedtime as needed for sleep.  03/20/14  Yes Historical Provider, MD  diazepam (VALIUM) 5 MG tablet Take 1 tablet (5 mg total) by mouth every 6 (six) hours as needed (vertigo and nausea). Patient not taking: Reported on 05/10/2016 04/01/16   Margarita Mail, PA-C  meclizine (ANTIVERT) 25 MG tablet Take 1 tablet (25 mg total) by mouth 4 (four) times daily as needed for dizziness. Patient not taking: Reported on 05/10/2016 06/15/13   Rolland Porter, MD  ondansetron (ZOFRAN ODT) 4 MG disintegrating tablet Take 1 tablet (4 mg total) by mouth every 8 (eight) hours as needed for nausea or vomiting. Patient not taking: Reported on 05/10/2016 02/04/16   [facility] Ward, PA-C  pantoprazole (PROTONIX) 20 MG tablet Take 1 tablet (20 mg total) by mouth daily. Patient not taking: Reported on 05/10/2016 02/04/16   Ozella Almond Ward, PA-C    Family History Family History  Problem Relation Age of Onset  . Cancer Father     Colon  . Coronary artery disease    . Heart attack Mother     Social History Social History  Substance Use Topics  . Smoking status: Current Every Day Smoker    Packs/day: 0.50    Years: 10.00    Types: Cigarettes  . Smokeless tobacco: Never Used     Comment: smokes 6-7 cigarettes per day  . Alcohol use No     Allergies   Bee venom; Doxycycline; Erythromycin; Ibuprofen; Morphine and related; Penicillins; Sulfa antibiotics; Cephalexin; Dust mite extract;  Tramadol; Other; and Sulfonamide derivatives   Review of Systems Review of Systems Level V caveat applies due to altered mental status  Physical Exam Updated Vital Signs BP (!) 146/84 (BP Location: Right Arm)   Pulse 97   Temp 98.3 F (36.8 C) (Oral)   Resp 15   Ht 5\' 5"  (1.651 m)   Wt 81.6 kg   SpO2 92%   BMI 29.95 kg/m   Physical Exam  Constitutional: She appears well-developed and well-nourished.  HENT:  Head: Normocephalic and  atraumatic.  Eyes: Pupils are equal, round, and reactive to light.  Cardiovascular: Normal rate and regular rhythm.   Pulmonary/Chest: Effort normal and breath sounds normal. No respiratory distress.  Neurological: GCS eye subscore is 2. GCS verbal subscore is 4. GCS motor subscore is 5.  Unable to do full neurological testing due to altered mental status  Skin: Skin is warm and dry.     ED Treatments / Results  Labs (all labs ordered are listed, but only abnormal results are displayed) Labs Reviewed  BASIC METABOLIC PANEL - Abnormal; Notable for the following:       Result Value   Sodium 134 (*)    Chloride 97 (*)    All other components within normal limits  CBC WITH DIFFERENTIAL/PLATELET - Abnormal; Notable for the following:    WBC 17.9 (*)    Neutro Abs 14.8 (*)    Monocytes Absolute 1.1 (*)    All other components within normal limits  URINE RAPID DRUG SCREEN, HOSP PERFORMED - Abnormal; Notable for the following:    Opiates POSITIVE (*)    Benzodiazepines POSITIVE (*)    Amphetamines POSITIVE (*)    All other components within normal limits  URINALYSIS, ROUTINE W REFLEX MICROSCOPIC (NOT AT [facility] - Abnormal; Notable for the following:    Hgb urine dipstick LARGE (*)    Ketones, ur 15 (*)    Protein, ur 30 (*)    All other components within normal limits  ACETAMINOPHEN LEVEL - Abnormal; Notable for the following:    Acetaminophen (Tylenol), Serum <10 (*)    All other components within normal limits  HEPATIC FUNCTION PANEL -  Abnormal; Notable for the following:    AST 64 (*)    Total Bilirubin 2.1 (*)    Indirect Bilirubin 1.6 (*)    All other components within normal limits  URINE MICROSCOPIC-ADD ON - Abnormal; Notable for the following:    Bacteria, UA RARE (*)    All other components within normal limits  I-STAT CG4 LACTIC ACID, ED - Abnormal; Notable for the following:    Lactic Acid, Venous 2.80 (*)    All other components within normal limits  URINE CULTURE  CULTURE, BLOOD (ROUTINE X 2)  CULTURE, BLOOD (ROUTINE X 2)  ETHANOL  I-STAT CG4 LACTIC ACID, ED    EKG  EKG Interpretation  Date/Time:  Monday May 10 2016 06:45:33 EDT Ventricular Rate:  96 PR Interval:    QRS Duration: 95 QT Interval:  373 QTC Calculation: 472 R Axis:   78 Text Interpretation:  Sinus rhythm Minimal ST depression, diffuse leads No significant change since last tracing Confirmed by WARD,  DO, KRISTEN [id number]) on 05/10/2016 6:48:36 AM       Radiology X-ray Chest Pa And Lateral  Result Date: 05/10/2016 CLINICAL DATA:  Fever, pain in legs, hypertension, CHF, cardiomyopathy EXAM: CHEST  2 VIEW COMPARISON:  05/10/2016 FINDINGS: Normal heart size, mediastinal contours, and pulmonary vascularity. Persistent atelectasis versus infiltrate at RIGHT base. Remaining lungs clear. No definite pleural effusion or pneumothorax. Bones demineralized with evidence of prior cervicothoracic fusion. IMPRESSION: Persistent atelectasis versus infiltrate RIGHT base. Electronically Signed   By: Lavonia Dana M.D.   On: 05/10/2016 15:25   Dg Chest Port 1 View  Result Date: 05/10/2016 CLINICAL DATA:  Altered mental status.  Sepsis. EXAM: PORTABLE CHEST 1 VIEW COMPARISON:  11/11/2015 FINDINGS: Decreased lung volumes are seen with mild bibasilar atelectasis. No evidence of pulmonary consolidation or edema. No evidence of pneumothorax or  pleural effusion. Heart size is within normal limits. Cervical spine fusion hardware again noted. IMPRESSION:  Decreased lung volumes with mild bibasilar atelectasis. Electronically Signed   By: Earle Gell M.D.   On: 05/10/2016 07:55   Dg Hip Port Unilat W Or Wo Pelvis 1 View Left  Result Date: 05/10/2016 CLINICAL DATA:  Left hip pain. EXAM: DG HIP (WITH OR WITHOUT PELVIS) 1V PORT LEFT COMPARISON:  None. FINDINGS: There is no evidence of hip fracture or dislocation. No pelvic fracture identified. There is no evidence of arthropathy or other focal bone abnormality. IMPRESSION: Negative. Electronically Signed   By: Earle Gell M.D.   On: 05/10/2016 07:57    Procedures Procedures (including critical care time)  Medications Ordered in ED Medications  vancomycin (VANCOCIN) IVPB 1000 mg/200 mL premix (not administered)  hydrocortisone (CORTEF) tablet 20 mg (not administered)  nitroGLYCERIN (NITROSTAT) SL tablet 0.4 mg (not administered)  cyclobenzaprine (FLEXERIL) tablet 10 mg (not administered)  levothyroxine (SYNTHROID, LEVOTHROID) tablet 75 mcg (not administered)  aspirin chewable tablet 81 mg (not administered)  enoxaparin (LOVENOX) injection 40 mg (not administered)  sodium chloride flush (NS) 0.9 % injection 3 mL (not administered)  0.9 %  sodium chloride infusion (not administered)  acetaminophen (TYLENOL) tablet 650 mg (not administered)    Or  acetaminophen (TYLENOL) suppository 650 mg (not administered)  ketorolac (TORADOL) 30 MG/ML injection 30 mg (not administered)  hydrocortisone (CORTEF) tablet 10 mg (not administered)  ceFEPIme (MAXIPIME) 2 g in dextrose 5 % 50 mL IVPB (not administered)  sodium chloride 0.9 % bolus 1,000 mL (0 mLs Intravenous Stopped 05/10/16 0851)  aztreonam (AZACTAM) 2 g in dextrose 5 % 50 mL IVPB (0 g Intravenous Stopped 05/10/16 0802)  vancomycin (VANCOCIN) IVPB 1000 mg/200 mL premix (0 mg Intravenous Stopped 05/10/16 0907)  levofloxacin (LEVAQUIN) IVPB 750 mg (0 mg Intravenous Stopped 05/10/16 0957)  naloxone (NARCAN) injection 0.4 mg (0.4 mg Intravenous Given 05/10/16  0847)  sodium chloride 0.9 % bolus 1,400 mL (0 mLs Intravenous Stopped 05/10/16 1030)    Patient was monitored closely.  She was placed on nasal oxygen.  Due to the fact that initially she was mildly hypoxic.  The patient will be admitted to the hospital for possible early sepsis.  I did give the patient Narcan and she did wake up and give me more information.  She states that she has had an upper respiratory infection.  She states that she is having pain in the lower back and buttocks region Initial Impression / Assessment and Plan / ED Course  I have reviewed the triage vital signs and the nursing notes.  Pertinent labs & imaging results that were available during my care of the patient were reviewed by me and considered in my medical decision making (see chart for details).  Clinical Course    CRITICAL CARE Performed by: Brent General Total critical care time: 45 minutes Critical care time was exclusive of separately billable procedures and treating other patients. Critical care was necessary to treat or prevent imminent or life-threatening deterioration. Critical care was time spent personally by me on the following activities: development of treatment plan with patient and/or surrogate as well as nursing, discussions with consultants, evaluation of patient's response to treatment, examination of patient, obtaining history from patient or surrogate, ordering and performing treatments and interventions, ordering and review of laboratory studies, ordering and review of radiographic studies, pulse oximetry and re-evaluation of patient's condition.  Patient was altered and does have elevated temperature along with  elevated lactate.   I spoke with the family practice resident who will come and admit the patient.  She was not started on a Narcan drip.  She was maintaining his airway  Final Clinical Impressions(s) / ED Diagnoses   Final diagnoses:  Fever, unspecified fever cause     New Prescriptions Current Discharge Medication List       Dalia Heading, PA-C 05/10/16 1536    Lacretia Leigh, MD 05/11/16 1714

## 2016-05-11 ENCOUNTER — Inpatient Hospital Stay (HOSPITAL_COMMUNITY): Payer: Medicare Other

## 2016-05-11 ENCOUNTER — Encounter (HOSPITAL_COMMUNITY): Payer: Self-pay

## 2016-05-11 DIAGNOSIS — R509 Fever, unspecified: Secondary | ICD-10-CM

## 2016-05-11 DIAGNOSIS — D72829 Elevated white blood cell count, unspecified: Secondary | ICD-10-CM

## 2016-05-11 DIAGNOSIS — E876 Hypokalemia: Secondary | ICD-10-CM

## 2016-05-11 DIAGNOSIS — E039 Hypothyroidism, unspecified: Secondary | ICD-10-CM

## 2016-05-11 DIAGNOSIS — M609 Myositis, unspecified: Secondary | ICD-10-CM

## 2016-05-11 LAB — COMPREHENSIVE METABOLIC PANEL
ALBUMIN: 3.3 g/dL — AB (ref 3.5–5.0)
ALK PHOS: 93 U/L (ref 38–126)
ALT: 69 U/L — AB (ref 14–54)
AST: 163 U/L — AB (ref 15–41)
Anion gap: 10 (ref 5–15)
BUN: 7 mg/dL (ref 6–20)
CALCIUM: 9.2 mg/dL (ref 8.9–10.3)
CO2: 24 mmol/L (ref 22–32)
CREATININE: 0.85 mg/dL (ref 0.44–1.00)
Chloride: 105 mmol/L (ref 101–111)
GFR calc Af Amer: >60 mL/min (ref >60)
GFR calc non Af Amer: >60 mL/min (ref >60)
GLUCOSE: 93 mg/dL (ref 65–99)
Potassium: 3 mmol/L — ABNORMAL LOW (ref 3.5–5.1)
SODIUM: 139 mmol/L (ref 135–145)
Total Bilirubin: 1.1 mg/dL (ref 0.3–1.2)
Total Protein: 7.2 g/dL (ref 6.5–8.1)

## 2016-05-11 LAB — CK
CK TOTAL: 5748 U/L — AB (ref 38–234)
Total CK: 5027 U/L — ABNORMAL HIGH (ref 38–234)

## 2016-05-11 LAB — URINE CULTURE: Culture: >=100000 — AB

## 2016-05-11 LAB — CBC
HCT: 45.7 % (ref 36.0–46.0)
Hemoglobin: 15.1 g/dL — ABNORMAL HIGH (ref 12.0–15.0)
MCH: 28.2 pg (ref 26.0–34.0)
MCHC: 33 g/dL (ref 30.0–36.0)
MCV: 85.3 fL (ref 78.0–100.0)
PLATELETS: 338 10*3/uL (ref 150–400)
RBC: 5.36 MIL/uL — ABNORMAL HIGH (ref 3.87–5.11)
RDW: 13.8 % (ref 11.5–15.5)
WBC: 20.6 10*3/uL — ABNORMAL HIGH (ref 4.0–10.5)

## 2016-05-11 LAB — TSH: TSH: 0.352 u[IU]/mL (ref 0.350–4.500)

## 2016-05-11 MED ORDER — HYDROMORPHONE HCL 1 MG/ML IJ SOLN
2.0000 mg | Freq: Once | INTRAMUSCULAR | Status: AC
  Administered 2016-05-11: 2 mg via INTRAVENOUS
  Filled 2016-05-11: qty 2

## 2016-05-11 MED ORDER — SODIUM CHLORIDE 0.9 % IV SOLN: INTRAVENOUS | Status: DC

## 2016-05-11 MED ORDER — OXYCODONE HCL 5 MG PO TABS
10.0000 mg | ORAL_TABLET | ORAL | Status: DC | PRN
  Administered 2016-05-11 – 2016-05-14 (×9): 10 mg via ORAL
  Filled 2016-05-11 (×9): qty 2

## 2016-05-11 MED ORDER — SODIUM CHLORIDE 0.9 % IV BOLUS (SEPSIS)
1000.0000 mL | Freq: Once | INTRAVENOUS | Status: AC
  Administered 2016-05-11: 1000 mL via INTRAVENOUS

## 2016-05-11 MED ORDER — SODIUM CHLORIDE 0.9 % IV SOLN
INTRAVENOUS | Status: DC
  Administered 2016-05-11 – 2016-05-12 (×3): via INTRAVENOUS
  Administered 2016-05-13 (×2): 1000 mL via INTRAVENOUS
  Administered 2016-05-13 – 2016-05-14 (×4): via INTRAVENOUS

## 2016-05-11 MED ORDER — SODIUM CHLORIDE 0.9 % IV SOLN
INTRAVENOUS | Status: DC
  Administered 2016-05-11 (×2): via INTRAVENOUS

## 2016-05-11 MED ORDER — SODIUM CHLORIDE 0.9 % IV BOLUS (SEPSIS): 1000.0000 mL | Freq: Once | INTRAVENOUS | Status: DC

## 2016-05-11 MED ORDER — GADOBENATE DIMEGLUMINE 529 MG/ML IV SOLN
20.0000 mL | Freq: Once | INTRAVENOUS | Status: AC
  Administered 2016-05-11: 17 mL via INTRAVENOUS

## 2016-05-11 NOTE — Progress Notes (Signed)
   Subjective: Patient was evaluated this morning on rounds. She continues to endorse left lateral hip and buttocks pain that goes down to her mid thigh.  Objective:  Vital signs in last 24 hours: Vitals:   05/10/16 1446 05/10/16 2151 05/11/16 0651 05/11/16 1314  BP: (!) 146/84 (!) 151/89 (!) 146/97 (!) 102/58  Pulse: 97 96 94 80  Resp: 15 11 20 15   Temp: 98.3 F (36.8 C) 98.8 F (37.1 C) 98.2 F (36.8 C) 98.4 F (36.9 C)  TempSrc: Oral Oral Oral   SpO2: 92% 96% 99% 95%  Weight:      Height:       Physical Exam  Constitutional:  More alert than yesterday  Cardiovascular: Normal rate, regular rhythm and normal heart sounds.  Exam reveals no gallop and no friction rub.   No murmur heard. Pulmonary/Chest:  Clear to auscultation bilaterally with no wheezes, rhonchi or rales  Musculoskeletal:  Tenderness on left buttocks and lateral left thigh No severe pain on internal rotation of the left leg     Assessment/Plan:  Principal Problem:   Left leg pain Active Problems:   Hypothyroidism   Hyperlipidemia   Essential hypertension   Addison's disease ([facility])   CAD (coronary artery disease)   GERD (gastroesophageal reflux disease)   Anxiety   Fever  Left sided myositis Patient had an MRI of hip and lumbar spine that showed extensive muscle edema around the left buttocks and thigh compatible with myositis. There is severe myofasciitis involving the left hip and pelvic musculature.  This could possibly be infectious as patient had a fever of 101.4 and leukocytosis on admission. Patient has been afebrile since first admitted but continues to have leukocytosis at 20.6.  I am concerned that she could have overdosed on her oxycodone at home and had developed trauma to the area. When she arrived to the ED she was somnolent and would not arouse other than to sternal rub. She was given Narcan and became more alert.  Patient could also have potential rhabdomyolysis and fluids will be given  for renal protection.     - Oxycodone 10mg  Q4 - trend CK levels - auto-antibodies for polymyositis and dermatomyositis - 1L NS bolus  Fever/Leukocytosis Patient is afebrile and WBC 20.6. Etiology of Leukocytosis is unknown.  Vitals are stable. 2 view chest x ray showed persistent atelectasis versus infiltrate of right base.  Urine culture showed overgrowth of lactobacillus - Stopped broad spectrum antibiotics - CBC - Blood cultures pending  Hypothyroidism  Stable, On levothyroxine 35mcg at home - Continue home medication    Dispo: Anticipated discharge in approximately 2 day(s).   Valinda Party, DO 05/11/2016, 6:06 PM Pager: [phone]

## 2016-05-11 NOTE — Evaluation (Signed)
Occupational Therapy Evaluation Patient Details Name: [name] MRN: [id number] DOB: [birth year] Today's Date: 05/11/2016    History of Present Illness pt is a 50 y/o female with PMH of chronic pain, jHTN, CHF, CM, cardiac arrest, admitted with left-sided lower back pain radicular down to Left hip and buttock, MRI of Left hip showing myofascitis, suspected of an infectious nature.  Work up continues.   Clinical Impression   This 9 y o female admitted with above presents to acute OT with deficits below (see OT problem list) thus affecting her PLOF of being totally independent pta. She will benefit from acute OT with follow up [place] to get back to PLOF.    Follow Up Recommendations  Home health OT    Equipment Recommendations  3 in 1 bedside comode;Tub/shower seat       Precautions / Restrictions Precautions Precautions: Fall Restrictions Weight Bearing Restrictions: No      Mobility Bed Mobility Overal bed mobility: Needs Assistance Bed Mobility: Sit to Sidelying     Sit to sidelying: Mod assist (for legs)  Transfers Overall transfer level: Needs assistance Equipment used: Rolling walker (2 wheeled) Transfers: Sit to/from Stand Sit to Stand: Min assist         General transfer comment: cues for hand placement/transfer safety    Balance Overall balance assessment: Needs assistance Sitting-balance support: Single extremity supported;Feet supported Sitting balance-Leahy Scale: Poor Sitting balance - Comments: pt reports she feels better (less pain) if she uses 1 UE for support while seated   Standing balance support: Bilateral upper extremity supported Standing balance-Leahy Scale: Poor Standing balance comment: reliant on RW                            ADL Overall ADL's : Needs assistance/impaired Eating/Feeding: Independent (supported sitting)   Grooming: Set up (supported sitting)   Upper Body Bathing: Set up (supported sitting)   Lower Body  Bathing: Moderate assistance (min A sit<>stand)   Upper Body Dressing : Set up (supported sitting)   Lower Body Dressing: Maximal assistance (min A sit<>stand)   Toilet Transfer: Minimal assistance;Stand-pivot;RW   Toileting- Clothing Manipulation and Hygiene: Min guard (min A sit<>stand)                         Pertinent Vitals/Pain Pain Assessment: 0-10 Pain Score: 9  Faces Pain Scale: Hurts even more Pain Location: left hip and SI joint Pain Descriptors / Indicators: Aching;Moaning;Grimacing;Guarding;Sore Pain Intervention(s): Monitored during session;Repositioned     Hand Dominance Right   Extremity/Trunk Assessment Upper Extremity Assessment Upper Extremity Assessment: Overall WFL for tasks assessed         Communication Communication Communication: No difficulties   Cognition Arousal/Alertness: Awake/alert Behavior During Therapy: WFL for tasks assessed/performed Overall Cognitive Status: Within Functional Limits for tasks assessed                                Home Living Family/patient expects to be discharged to:: Private residence Living Arrangements: Children Available Help at Discharge: Family;Available 24 hours/day (daughter 73 and ?mentally delayed, son 45) Type of Home: House Home Access: Stairs to enter [employer] of Steps: 2-3   Home Layout: One level     Bathroom Shower/Tub: Tub/shower unit;Curtain Shower/tub characteristics: Architectural technologist: Standard     Home Equipment: Cane - single point  Prior Functioning/Environment Level of Independence: Independent with assistive device(s);Independent        Comments: disabled    OT Diagnosis: Generalized weakness;Acute pain   OT Problem List: Decreased strength;Decreased range of motion;Decreased activity tolerance;Impaired balance (sitting and/or standing);Pain;Decreased knowledge of use of DME or AE   OT Treatment/Interventions:  Self-care/ADL training;Balance training;Therapeutic activities;DME and/or AE instruction;Patient/family education    OT Goals(Current goals can be found in the care plan section) Acute Rehab OT Goals Patient Stated Goal: home once I can move better OT Goal Formulation: With patient Time For Goal Achievement: 05/18/16 Potential to Achieve Goals: Good  OT Frequency: Min 2X/week          End of Session Equipment Utilized During Treatment: Rolling walker  Activity Tolerance: Patient limited by pain Patient left: in bed;with call bell/phone within reach;with bed alarm set   Time: 1457-1515 OT Time Calculation (min): 18 min Charges:  OT General Charges $OT Visit: 1 Procedure OT Evaluation $OT Eval Moderate Complexity: 1 Procedure  Almon Register [id number] 05/11/2016, 3:23 PM

## 2016-05-11 NOTE — Progress Notes (Signed)
Pt voided 100cc. Made IM teaching service resident aware. IM resident ordered to keep monitoring pt. Will continue to monitor and will make night shift nurse aware.

## 2016-05-11 NOTE — Progress Notes (Signed)
Physical Therapy Evaluation Patient Details Name: [name] MRN: [id number] DOB: 19-Feb-1966 Today's Date: 05/11/2016   History of Present Illness  pt is a 49 y/o female with PMH of chronic pain, jHTN, CHF, CM, cardiac arrest, admitted with left-sided lower back pain radicular down to Left hip and buttock, MRI of Left hip showing myofascitis, suspected of an infectious nature.  Work up continues.  Clinical Impression  Pt admitted with/for left back, hip and buttock pain though to be infectious in nature..  Pt currently limited functionally due to the problems listed below.  (see problems list.)  Pt will benefit from PT to maximize function and safety to be able to get home safely with available assist of family.     Follow Up Recommendations No PT follow up;Other (comment);Supervision for mobility/OOB (may need some hh f/u in unlikely event she is not supervisio)    Equipment Recommendations  Rolling walker with 5" wheels;Other (comment) (if pt doesn't have one)    Recommendations for Other Services       Precautions / Restrictions Precautions Precautions: Fall      Mobility  Bed Mobility Overal bed mobility: Needs Assistance Bed Mobility: Supine to Sit;Sit to Supine     Supine to sit: Min assist Sit to supine: Mod assist   General bed mobility comments: L LE dependent on some level of assist.  Transfers Overall transfer level: Needs assistance   Transfers: Sit to/from Stand Sit to Stand: Min guard         General transfer comment: cues for hand placement/transfer safety  Ambulation/Gait Ambulation/Gait assistance: Min assist Ambulation Distance (Feet): 25 Feet Assistive device: Rolling walker (2 wheeled) Gait Pattern/deviations: Step-to pattern Gait velocity: slow Gait velocity interpretation: Below normal speed for age/gender General Gait Details: cues for step to patter with heavy UE use to keep from buckling on the L LE  Stairs             Wheelchair Mobility    Modified Rankin (Stroke Patients Only)       Balance Overall balance assessment: Needs assistance Sitting-balance support: No upper extremity supported;Feet supported Sitting balance-Leahy Scale: Good Sitting balance - Comments: washed face, neck and hair all plastered down with sweat while sitting EOB, reaching for cloths.   Standing balance support: Bilateral upper extremity supported Standing balance-Leahy Scale: Poor Standing balance comment: tried letting go of RW to gel hands and L LE collapsed.                             Pertinent Vitals/Pain Pain Assessment: Faces Faces Pain Scale: Hurts even more Pain Location: L hip Pain Descriptors / Indicators: Aching;Moaning;Grimacing;Sore Pain Intervention(s): Monitored during session;Repositioned    Home Living Family/patient expects to be discharged to:: Private residence Living Arrangements: Children Available Help at Discharge: Family;Available 24 hours/day (daughter 57 and ?mentally delayed, son 18) Type of Home: House Home Access: Stairs to enter   [employer] of Steps: 2-3 Home Layout: One level Home Equipment: Cane - single point      Prior Function Level of Independence: Independent with assistive device(s);Independent         Comments: disabled     Hand Dominance        Extremity/Trunk Assessment   Upper Extremity Assessment: Defer to OT evaluation           Lower Extremity Assessment: RLE deficits/detail;LLE deficits/detail RLE Deficits / Details: WFL LLE Deficits / Details: weakness due  to proximal/hip pain, limiting testing     Communication   Communication: No difficulties  Cognition Arousal/Alertness: Awake/alert Behavior During Therapy: WFL for tasks assessed/performed Overall Cognitive Status: Within Functional Limits for tasks assessed                      General Comments      Exercises        Assessment/Plan     PT Assessment Patient needs continued PT services  PT Diagnosis Difficulty walking;Acute pain;Generalized weakness   PT Problem List Decreased strength;Decreased activity tolerance;Decreased balance;Decreased mobility;Decreased knowledge of use of DME;Pain  PT Treatment Interventions DME instruction;Gait training;Functional mobility training;Therapeutic activities;Balance training;Patient/family education   PT Goals (Current goals can be found in the Care Plan section) Acute Rehab PT Goals Patient Stated Goal: home when appropriate PT Goal Formulation: With patient Time For Goal Achievement: 05/18/16 Potential to Achieve Goals: Good    Frequency Min 3X/week   Barriers to discharge        Co-evaluation               End of Session   Activity Tolerance: Patient tolerated treatment well;Patient limited by pain Patient left: in bed;with call bell/phone within reach;with bed alarm set;with nursing/sitter in room Nurse Communication: Mobility status         Time: 1204-1238 PT Time Calculation (min) (ACUTE ONLY): 34 min   Charges:   PT Evaluation $PT Eval Moderate Complexity: 1 Procedure PT Treatments $Gait Training: 8-22 mins   PT G Codes:        Cache Decoursey, Tessie Fass 05/11/2016, 12:56 PM 05/11/2016  Donnella Sham, PT [phone]  (pager)

## 2016-05-12 LAB — BASIC METABOLIC PANEL
Anion gap: 8 (ref 5–15)
Anion gap: 5 (ref 5–15)
BUN: 7 mg/dL (ref 6–20)
BUN: 5 mg/dL — ABNORMAL LOW (ref 6–20)
CALCIUM: 8 mg/dL — AB (ref 8.9–10.3)
CALCIUM: 7.5 mg/dL — AB (ref 8.9–10.3)
CO2: 20 mmol/L — ABNORMAL LOW (ref 22–32)
CO2: 23 mmol/L (ref 22–32)
CREATININE: 0.82 mg/dL (ref 0.44–1.00)
CREATININE: 0.85 mg/dL (ref 0.44–1.00)
Chloride: 109 mmol/L (ref 101–111)
Chloride: 113 mmol/L — ABNORMAL HIGH (ref 101–111)
GFR calc non Af Amer: >60 mL/min (ref >60)
Glucose, Bld: 129 mg/dL — ABNORMAL HIGH (ref 65–99)
Glucose, Bld: 100 mg/dL — ABNORMAL HIGH (ref 65–99)
Potassium: 3.8 mmol/L (ref 3.5–5.1)
Potassium: 3.7 mmol/L (ref 3.5–5.1)
SODIUM: 137 mmol/L (ref 135–145)
SODIUM: 141 mmol/L (ref 135–145)

## 2016-05-12 LAB — CBC
HCT: 37.6 % (ref 36.0–46.0)
Hemoglobin: 12.1 g/dL (ref 12.0–15.0)
MCH: 27.8 pg (ref 26.0–34.0)
MCHC: 32.2 g/dL (ref 30.0–36.0)
MCV: 86.4 fL (ref 78.0–100.0)
PLATELETS: 292 10*3/uL (ref 150–400)
RBC: 4.35 MIL/uL (ref 3.87–5.11)
RDW: 14.3 % (ref 11.5–15.5)
WBC: 11.9 10*3/uL — AB (ref 4.0–10.5)

## 2016-05-12 LAB — CK
CK TOTAL: 3974 U/L — AB (ref 38–234)
Total CK: 1495 U/L — ABNORMAL HIGH (ref 38–234)

## 2016-05-12 LAB — MAGNESIUM: MAGNESIUM: 1.4 mg/dL — AB (ref 1.7–2.4)

## 2016-05-12 MED ORDER — MAGNESIUM SULFATE 50 % IJ SOLN: 4.0000 g | Freq: Once | INTRAMUSCULAR | Status: DC

## 2016-05-12 MED ORDER — MAGNESIUM SULFATE 50 % IJ SOLN: 2.0000 g | Freq: Once | INTRAMUSCULAR | Status: DC

## 2016-05-12 MED ORDER — MAGNESIUM SULFATE 2 GM/50ML IV SOLN
2.0000 g | Freq: Once | INTRAVENOUS | Status: AC
  Administered 2016-05-12: 2 g via INTRAVENOUS
  Filled 2016-05-12: qty 50

## 2016-05-12 MED ORDER — SODIUM CHLORIDE 0.9 % IV BOLUS (SEPSIS)
1000.0000 mL | Freq: Once | INTRAVENOUS | Status: AC
  Administered 2016-05-12: 1000 mL via INTRAVENOUS

## 2016-05-12 MED ORDER — POTASSIUM CHLORIDE CRYS ER 20 MEQ PO TBCR
40.0000 meq | EXTENDED_RELEASE_TABLET | Freq: Once | ORAL | Status: AC
  Administered 2016-05-12: 40 meq via ORAL
  Filled 2016-05-12: qty 2

## 2016-05-12 NOTE — Progress Notes (Signed)
   Subjective: Patient was evaluated this morning on rounds. Patient was sitting up eating breakfast on the edge of her bed. She states that she continues to have left-sided buttocks and thigh pain but that the knot in her buttocks has resolved. She has been able to ambulate with the help of a walker.  Objective:  Vital signs in last 24 hours: Vitals:   05/11/16 1314 05/11/16 2051 05/11/16 2300 05/12/16 0538  BP: (!) 102/58 (!) 88/51 101/62 114/64  Pulse: 80 76 76 85  Resp: 15 19  18   Temp: 98.4 F (36.9 C) 97.5 F (36.4 C)  99 F (37.2 C)  TempSrc:  Oral  Oral  SpO2: 95% 94%  97%  Weight:      Height:       Physical Exam  Constitutional:  Alert, engaged in conversation and speaking in complete sentences. Cardiovascular: Normal rate, regular rhythm and normal heart sounds.  Exam reveals no gallop and no friction rub.  No murmur heard. Pulmonary/Chest:  Clear to auscultation bilaterally with no wheezes, rhonchi or rales.  Effort is normal.  Musculoskeletal:  Tenderness on left buttocks and lateral left thigh  Assessment/Plan:  Principal Problem:   Myositis Active Problems:   Hypothyroidism   Hyperlipidemia   Essential hypertension   Addison's disease (HCC)   CAD (coronary artery disease)   GERD (gastroesophageal reflux disease)   Anxiety  Left sided myositis Patient continues to be afebrile and white blood cell count is down to 11.9. Her CK has decreased to 1,495 from 5,027. I continue to think that her rhabdomyolysis was contributed by oversedation on narcotic and benzodiazepine meds and laying on her left side for a long period of time without moving. This is supported by her somnolence and lack of responsiveness on admission that changed when given Narcan. Her UDS is positive for opioids and benzodiazepine and amphetamines. She states that she accidentally took one of her daughters Adderall pills.  Patient continues to have left buttocks and thigh pain although improved  since admission. She is able to move her leg and ambulate with a walker. She states that she is having muscle spasms and would like her Flexeril.  Her creatinine is 0.82 and not concerned at this time for renal complications from rhabdomyolysis.  We will continue to give fluids until CK is back to normal limits. - NS 270ml/hr - Monitor I/Os - Flexeril 5mg  TID  - Awaiting auto antibodies for polymyositis, dermatomyositis and ANA. u1-rnp was ordered to assess for mixed connective tissue disease but lab stated they could not test for this.  - Discontinue Telemetry   Hypokalemia  Magnesium level is 1.4 and likely contributing to her hypokalemia  - 40 mEq Kdur  - Magnesium   Fever/Leukocytosis Resolved.  Likely a stress reaction from rhabdomyolysis. - Blood cultures pending  Hypothyroidism Stable, On levothyroxine 53mcg at home - Continue home medication   Dispo: Anticipated discharge in approximately 2 day(s).   Valinda Party, DO 05/12/2016, 3:06 PM Pager: [phone]

## 2016-05-12 NOTE — Progress Notes (Signed)
Internal Medicine Attending:   I saw and examined the patient. I reviewed the resident's note and I agree with the resident's findings and plan as documented in the resident's note.  I had a long discussion with the patient about the MRI findings of myositis of the left lateral hip. On closer exam I can see a faint bruise overlying the left greater trochanter. The patient reports she does bump into things because of vertigo. Denies any recent falls or prolonged time on the ground. Denies any recent trauma or abuse. No other muscle pain recently. She had a small self limited URI about a week ago that lasted for only 24 hours. Given all this information, I still think a localized trauma or muscle compression is the most likely cause. Drug induced is possible, I am not sure why amphetamines were present in her urine tox but this can cause myositis. She has no rash or other exam signs of DM or PM, but we will send the usual auto-antibodies. We will need to continue aggressive IV fluids to maintain urine output around 200 ml per hour. We will also need to replete potassium adequately. I talked with her nurse, we gave a 1L bolus of IV fluid and will [place] NS at 200/hr overnight. I asked them to be in touch with our team through the night if urine output falls off.

## 2016-05-12 NOTE — Progress Notes (Addendum)
Md notified at [facility] about pt urinary output being 80 cc since 2026. Bladder scan completed at 0140 as ordered, it indicated 140 cc, MD notified. RN instructed to repeat bladder scan in two hours if pt does not urinate.

## 2016-05-12 NOTE — Progress Notes (Signed)
Internal Medicine Attending:   I saw and examined the patient. I reviewed the resident's note and I agree with the resident's findings and plan as documented in the resident's note.  Patient is doing well today. Renal function is stable, CK is coming down nicely. Urine output has been below goal so we are going to provide another bolus of NS now, and again this evening if UOP does not increase to at least 200/hr. ENA panel is ordered and will be an adequate test for PM/DM autoantibodies. I clarified the amphetamine question, patient tells me she mistakenly took a dose of her daughter's Adderall. I counciled her on the danger of this kind of polypharmacy. I still think this localized rhabdo is probally traumatic, and likely a compression injury; she tells [place] that a few days ago she was sitting on the toilet for over three hours because she fell asleep. Long term, I think this patient's polypharmacy is a significant danger to her and we will advise her PCP to work on dramatically de-escalating centrally acting medications.

## 2016-05-12 NOTE — Progress Notes (Signed)
Physical Therapy Treatment Patient Details Name: [name] MRN: [id number] DOB: 29-May-1966 Today's Date: 05/12/2016    History of Present Illness pt is a 50 y/o female with PMH of chronic pain, jHTN, CHF, CM, cardiac arrest, admitted with left-sided lower back pain radicular down to Left hip and buttock, MRI of Left hip showing myofascitis, suspected of an infectious nature.  Work up continues.    PT Comments    Patient progressing well towards PT goals. Pain seems more tolerable today. Focused on gait training with use of RW. Pt continues to demonstrate left knee instability and pain with weight bearing. Fatigues quickly. Instructed pt in sit to stand exercises to perform at home and to activate quad/hip musculature to improve functional activities/strength. Recommend HHPT to maximize independence and mobility and decrease fall risk at home. Will continue to follow.  Follow Up Recommendations  Home health PT;Supervision for mobility/OOB     Equipment Recommendations  Rolling walker with 5" wheels    Recommendations for Other Services       Precautions / Restrictions Precautions Precautions: Fall Restrictions Weight Bearing Restrictions: No    Mobility  Bed Mobility Overal bed mobility: Needs Assistance Bed Mobility: Rolling;Sidelying to Sit Rolling: Supervision Sidelying to sit: Supervision;HOB elevated     Sit to sidelying: Min guard General bed mobility comments: Use of rail to get to EOB. Used UEs to bring LLE into bed.  Transfers Overall transfer level: Needs assistance Equipment used: Rolling walker (2 wheeled) Transfers: Sit to/from Stand Sit to Stand: Min guard         General transfer comment: Min guard for safety. Cues for hand placement/technique.  Ambulation/Gait Ambulation/Gait assistance: Min assist Ambulation Distance (Feet): 30 Feet Assistive device: Rolling walker (2 wheeled) Gait Pattern/deviations: Step-to pattern;Decreased stance time -  left;Decreased step length - right;Trunk flexed Gait velocity: slow Gait velocity interpretation: Below normal speed for age/gender General Gait Details: Heavy reliance on BUEs to prevent left knee from buckling. Increased knee flexion throughout gait. Fatigues.    Stairs            Wheelchair Mobility    Modified Rankin (Stroke Patients Only)       Balance Overall balance assessment: Needs assistance Sitting-balance support: Feet supported;No upper extremity supported Sitting balance-Leahy Scale: Good     Standing balance support: During functional activity Standing balance-Leahy Scale: Poor Standing balance comment: Able to stand without UE support for short period but left knee instability noted.                    Cognition Arousal/Alertness: Awake/alert Behavior During Therapy: WFL for tasks assessed/performed Overall Cognitive Status: Within Functional Limits for tasks assessed                      Exercises Other Exercises Other Exercises: Sit to stand x5 with cues for slow controlled descent for eccentric control and extension when upright.    General Comments        Pertinent Vitals/Pain Pain Assessment: Faces Faces Pain Scale: Hurts even more Pain Location: left hip, SI joint Pain Descriptors / Indicators: Sore;Guarding;Aching Pain Intervention(s): Monitored during session;Repositioned;Patient requesting pain meds-RN notified    Home Living                      Prior Function            PT Goals (current goals can now be found in the care plan section) Progress  towards PT goals: Progressing toward goals    Frequency  Min 3X/week    PT Plan Discharge plan needs to be updated    Co-evaluation             End of Session Equipment Utilized During Treatment: Gait belt Activity Tolerance: Patient tolerated treatment well;Patient limited by fatigue Patient left: in bed;with call bell/phone within reach;with bed  alarm set     Time: 1034-1100 PT Time Calculation (min) (ACUTE ONLY): 26 min  Charges:  $Gait Training: 8-22 mins $Therapeutic Activity: 8-22 mins                    G Codes:      [name] A [name] 05/12/2016, 11:32 AM  [name], [name], DPT [phone]

## 2016-05-12 NOTE — Progress Notes (Signed)
Received a call from the lab stating that they did not know what the misc. Lab "anti U1 RNP antibody" was and called [facility] and they also did not know what that was. I notified Dr. Heber [place], she said she would call me back. She has not yet returned the phone call. Lab cancelled med until further instructions. [name] D

## 2016-05-12 NOTE — Progress Notes (Signed)
Pt urinated 400 cc, MD notified at 0500.

## 2016-05-13 LAB — CBC
HEMATOCRIT: 33.8 % — AB (ref 36.0–46.0)
Hemoglobin: 10.6 g/dL — ABNORMAL LOW (ref 12.0–15.0)
MCH: 27.6 pg (ref 26.0–34.0)
MCHC: 31.4 g/dL (ref 30.0–36.0)
MCV: 88 fL (ref 78.0–100.0)
Platelets: 273 10*3/uL (ref 150–400)
RBC: 3.84 MIL/uL — ABNORMAL LOW (ref 3.87–5.11)
RDW: 14.7 % (ref 11.5–15.5)
WBC: 7.8 10*3/uL (ref 4.0–10.5)

## 2016-05-13 LAB — ANTINUCLEAR ANTIBODIES, IFA: ANTINUCLEAR ANTIBODIES, IFA: NEGATIVE

## 2016-05-13 LAB — BASIC METABOLIC PANEL
Anion gap: 5 (ref 5–15)
BUN: 5 mg/dL — ABNORMAL LOW (ref 6–20)
CALCIUM: 7.8 mg/dL — AB (ref 8.9–10.3)
CO2: 20 mmol/L — AB (ref 22–32)
CREATININE: 0.66 mg/dL (ref 0.44–1.00)
Chloride: 116 mmol/L — ABNORMAL HIGH (ref 101–111)
GFR calc non Af Amer: >60 mL/min (ref >60)
Glucose, Bld: 91 mg/dL (ref 65–99)
Potassium: 4.2 mmol/L (ref 3.5–5.1)
Sodium: 141 mmol/L (ref 135–145)

## 2016-05-13 LAB — CK
CK TOTAL: 3303 U/L — AB (ref 38–234)
Total CK: 3051 U/L — ABNORMAL HIGH (ref 38–234)

## 2016-05-13 LAB — ANTI-JO 1 ANTIBODY, IGG: Anti JO-1: <0.2 AI (ref 0.0–0.9)

## 2016-05-13 MED ORDER — MECLIZINE HCL 25 MG PO TABS
25.0000 mg | ORAL_TABLET | Freq: Once | ORAL | Status: AC | PRN
  Administered 2016-05-13: 25 mg via ORAL
  Filled 2016-05-13 (×2): qty 1

## 2016-05-13 MED ORDER — SODIUM CHLORIDE 0.9 % IV BOLUS (SEPSIS)
2000.0000 mL | Freq: Once | INTRAVENOUS | Status: AC
  Administered 2016-05-13: 2000 mL via INTRAVENOUS

## 2016-05-13 MED ORDER — SODIUM CHLORIDE 0.9 % IV BOLUS (SEPSIS)
1000.0000 mL | Freq: Once | INTRAVENOUS | Status: AC
  Administered 2016-05-13: 1000 mL via INTRAVENOUS

## 2016-05-13 MED ORDER — ONDANSETRON 4 MG PO TBDP
8.0000 mg | ORAL_TABLET | Freq: Once | ORAL | Status: AC | PRN
  Administered 2016-05-13: 8 mg via ORAL
  Filled 2016-05-13: qty 2

## 2016-05-13 NOTE — Progress Notes (Signed)
   Subjective: She reports she still has some pain. No new complaints otherwise. No pain on opposite side.  Objective:  Vital signs in last 24 hours: Temp:  [98.2 F (36.8 C)-98.4 F (36.9 C)] 98.2 F (36.8 C) (09/07 0700) Pulse Rate:  [78-84] 78 (09/07 0700) Resp:  [18-19] 18 (09/07 0700) BP: (103-120)/(47-68) 120/68 (09/07 0700) SpO2:  [91 %-97 %] 91 % (09/07 0700)  General Apperance: NAD HEENT: Normocephalic, atraumatic, anicteric sclera Neck: Supple, trachea midline Lungs: Clear to auscultation bilaterally. No wheezes, rhonchi or rales. Breathing comfortably Heart: Regular rate and rhythm, no murmur/rub/gallop Abdomen: Soft, nontender, nondistended, no rebound/guarding Extremities: Warm and well perfused, no edema Skin: No rashes or lesions Neurologic: Alert and interactive. No gross deficits.   Assessment/Plan: 50 year old woman with CAD, HTN, GERD, HLD, hypothyroidism, Addison's disease and chronic pain presented with acute on chronic low back pain, fever and leukocytosis  Myositis: Most likely trauma in etiology. CK increased to 3974 yesterday afternoon but now down to 3051 this morning. Renal function stable. -2L NS bolus over 2 hours this morning -Continue NS@250ml /hr -Continue Flexeril -f/u autoantibodies for polymyositis, dermatomyositis, etc  Hypokalemia: Resolved  Hypothyroidism: Continue home Synthroid  Dispo: Anticipated discharge in approximately 1-2 day(s).   Milagros Loll, MD 05/13/2016, 12:53 PM Pager: [phone]

## 2016-05-13 NOTE — Progress Notes (Signed)
Internal Medicine Attending:   I saw and examined the patient. I reviewed the resident's note and I agree with the resident's findings and plan as documented in the resident's note.  Clinically the patient is doing well with ok urine output yesterday, still below target. CK increased yesterday from 1400 to 3000, she has some increased soreness today of the left lateral hip. We will increase hydration, bolus 2L this morning, and continue continuous infusion. Renal function is still excellent. ANA is negative, awaiting ENA.

## 2016-05-13 NOTE — Care Management Important Message (Signed)
Important Message  Patient Details  Name: [name] MRN: [id number] Date of Birth: [birth year]   Medicare Important Message Given:  Yes    Eulla Kochanowski Abena 05/13/2016, 10:57 AM

## 2016-05-13 NOTE — Progress Notes (Signed)
Occupational Therapy Treatment Patient Details Name: [name] MRN: [id number] DOB: 12/22/1965 Today's Date: 05/13/2016    History of present illness pt is a 50 y/o female with PMH of chronic pain, jHTN, CHF, CM, cardiac arrest, admitted with left-sided lower back pain radicular down to Left hip and buttock, MRI of Left hip showing myofascitis, suspected of an infectious nature.  Work up continues.   OT comments  Pt making slow, but steady progress. Pt continues to be limited by LLE pain that has now traveled to L side of abdomen and underneath rib cage. Pt able to complete ADLs with min assist and basic transfers with min guard assist due to spontaneous L knee buckling. Currrent POC and discharge disposition remain appropriate. Will continue to follow acutely to address OT needs and goals.   Follow Up Recommendations  Home health OT;Supervision - Intermittent    Equipment Recommendations  3 in 1 bedside comode;Tub/shower seat    Recommendations for Other Services      Precautions / Restrictions Precautions Precautions: Fall Restrictions Weight Bearing Restrictions: No       Mobility Bed Mobility Overal bed mobility: Needs Assistance Bed Mobility: Supine to Sit;Sit to Supine     Supine to sit: Min assist Sit to supine: Min assist   General bed mobility comments: Assist to move LLE on/off bed. Use of bedrails  Transfers Overall transfer level: Needs assistance Equipment used: Rolling walker (2 wheeled) Transfers: Sit to/from Stand Sit to Stand: Min guard         General transfer comment: Min guard for safety. Cues for hand placement/technique.    Balance Overall balance assessment: Needs assistance Sitting-balance support: No upper extremity supported;Feet supported Sitting balance-Leahy Scale: Good     Standing balance support: Bilateral upper extremity supported;During functional activity Standing balance-Leahy Scale: Poor Standing balance comment: LLE  knee buckling                   ADL Overall ADL's : Needs assistance/impaired     Grooming: Wash/dry hands;Wash/dry face;Oral care;Min guard;Standing       Lower Body Bathing: Minimal assistance;Sit to/from stand Lower Body Bathing Details (indicate cue type and reason): difficulty bending down to L foot     Lower Body Dressing: Minimal assistance;Sit to/from stand Lower Body Dressing Details (indicate cue type and reason): difficdulty reaching L foot Toilet Transfer: Min guard;Ambulation;BSC;RW   Toileting- Water quality scientist and Hygiene: Min guard;Sit to/from stand                Vision                     Perception     Praxis      Cognition   Behavior During Therapy: [facility] for tasks assessed/performed Overall Cognitive Status: Within Functional Limits for tasks assessed                       Extremity/Trunk Assessment               Exercises     Shoulder Instructions       General Comments      Pertinent Vitals/ Pain       Pain Assessment: Faces Faces Pain Scale: Hurts even more Pain Location: L hip, SI joint, and L side of abdomen up under rib cage Pain Descriptors / Indicators: Aching;Guarding;Sore Pain Intervention(s): Limited activity within patient's tolerance;Monitored during session;Repositioned  Home Living  Prior Functioning/Environment              Frequency Min 2X/week     Progress Toward Goals  OT Goals(current goals can now be found in the care plan section)  Progress towards OT goals: Progressing toward goals  Acute Rehab OT Goals Patient Stated Goal: home once I can move better OT Goal Formulation: With patient Time For Goal Achievement: 05/18/16 Potential to Achieve Goals: Good ADL Goals Pt Will Perform Grooming: with set-up;with supervision;standing Pt Will Perform Lower Body Dressing: sit to/from stand;with supervision;with  adaptive equipment Pt Will Transfer to Toilet: with supervision;ambulating;bedside commode Pt Will Perform Toileting - Clothing Manipulation and hygiene: with min guard assist;sit to/from stand Pt Will Perform Tub/Shower Transfer: Tub transfer;with supervision;ambulating;shower seat;3 in 1 Additional ADL Goal #1: Pt will Mod I in and OOB for basic ADLs  Plan Discharge plan remains appropriate    Co-evaluation                 End of Session Equipment Utilized During Treatment: Rolling walker   Activity Tolerance Patient limited by pain   Patient Left in bed;with call bell/phone within reach;with bed alarm set   Nurse Communication Mobility status        Time: [id number] OT Time Calculation (min): 17 min  Charges: OT General Charges $OT Visit: 1 Procedure OT Treatments $Self Care/Home Management : 8-22 mins  Redmond Baseman, OTR/L Pager: [phone] 05/13/2016, 2:52 PM

## 2016-05-14 DIAGNOSIS — M549 Dorsalgia, unspecified: Secondary | ICD-10-CM

## 2016-05-14 DIAGNOSIS — M6088 Other myositis, other site: Secondary | ICD-10-CM

## 2016-05-14 DIAGNOSIS — Z79899 Other long term (current) drug therapy: Secondary | ICD-10-CM

## 2016-05-14 DIAGNOSIS — G8929 Other chronic pain: Secondary | ICD-10-CM

## 2016-05-14 DIAGNOSIS — I10 Essential (primary) hypertension: Secondary | ICD-10-CM

## 2016-05-14 DIAGNOSIS — F419 Anxiety disorder, unspecified: Secondary | ICD-10-CM

## 2016-05-14 LAB — BASIC METABOLIC PANEL
ANION GAP: 7 (ref 5–15)
BUN: <5 mg/dL — ABNORMAL LOW (ref 6–20)
CALCIUM: 7.9 mg/dL — AB (ref 8.9–10.3)
CO2: 22 mmol/L (ref 22–32)
Chloride: 112 mmol/L — ABNORMAL HIGH (ref 101–111)
Creatinine, Ser: 0.68 mg/dL (ref 0.44–1.00)
Glucose, Bld: 95 mg/dL (ref 65–99)
POTASSIUM: 4.2 mmol/L (ref 3.5–5.1)
SODIUM: 141 mmol/L (ref 135–145)

## 2016-05-14 LAB — CBC
HCT: 36.9 % (ref 36.0–46.0)
HEMOGLOBIN: 11.6 g/dL — AB (ref 12.0–15.0)
MCH: 27.8 pg (ref 26.0–34.0)
MCHC: 31.4 g/dL (ref 30.0–36.0)
MCV: 88.3 fL (ref 78.0–100.0)
PLATELETS: 310 10*3/uL (ref 150–400)
RBC: 4.18 MIL/uL (ref 3.87–5.11)
RDW: 14.5 % (ref 11.5–15.5)
WBC: 9 10*3/uL (ref 4.0–10.5)

## 2016-05-14 LAB — ANTIEXTRACTABLE NUCLEAR AG: ENA SM Ab Ser-aCnc: <0.2 AI (ref 0.0–0.9)

## 2016-05-14 LAB — CK: CK TOTAL: 2443 U/L — AB (ref 38–234)

## 2016-05-14 LAB — MAGNESIUM: MAGNESIUM: 1.5 mg/dL — AB (ref 1.7–2.4)

## 2016-05-14 MED ORDER — LACTATED RINGERS IV SOLN
INTRAVENOUS | Status: DC
  Administered 2016-05-14: 1000 mL via INTRAVENOUS
  Administered 2016-05-14: 22:00:00 via INTRAVENOUS
  Administered 2016-05-14 (×2): 1000 mL via INTRAVENOUS
  Administered 2016-05-15: 06:00:00 via INTRAVENOUS

## 2016-05-14 MED ORDER — LACTATED RINGERS IV BOLUS (SEPSIS)
1000.0000 mL | Freq: Once | INTRAVENOUS | Status: AC
  Administered 2016-05-14: 1000 mL via INTRAVENOUS

## 2016-05-14 MED ORDER — AMLODIPINE BESYLATE 5 MG PO TABS
5.0000 mg | ORAL_TABLET | Freq: Every day | ORAL | Status: DC
  Administered 2016-05-14 – 2016-05-15 (×2): 5 mg via ORAL
  Filled 2016-05-14 (×2): qty 1

## 2016-05-14 MED ORDER — OXYCODONE HCL 5 MG PO TABS
30.0000 mg | ORAL_TABLET | Freq: Four times a day (QID) | ORAL | Status: DC | PRN
  Administered 2016-05-14 – 2016-05-15 (×4): 30 mg via ORAL
  Filled 2016-05-14 (×4): qty 6

## 2016-05-14 MED ORDER — LORAZEPAM 1 MG PO TABS
2.0000 mg | ORAL_TABLET | Freq: Once | ORAL | Status: AC
  Administered 2016-05-14: 2 mg via ORAL
  Filled 2016-05-14: qty 2

## 2016-05-14 NOTE — Progress Notes (Signed)
Physical Therapy Treatment Patient Details Name: [name] MRN: [id number] DOB: [birth year] Today's Date: 05/14/2016    History of Present Illness pt is a 50 y/o female with PMH of chronic pain, jHTN, CHF, CM, cardiac arrest, admitted with left-sided lower back pain radicular down to Left hip and buttock, MRI of Left hip showing myofascitis, suspected of an infectious nature.  Work up continues.    PT Comments    Patient presents with symptoms of possible R vestibular hypofunction.  Though symptoms not consistent, and she may have component of orthostasis at times as well, feel that is best option that can explain her symptoms.  She also reports recent dx of aneurysm, but seems as if small as reports supposed to follow up in 5 years.  Feel she is improving with suggestion of d/c home soon and was able to tolerate increased distance of ambulation this session.  Will benefit from HHPT with vestibular rehab.  Follow Up Recommendations  Home health PT;Supervision for mobility/OOB     Equipment Recommendations  Rolling walker with 5" wheels;3in1 (PT)    Recommendations for Other Services       Precautions / Restrictions Precautions Precautions: Fall    Mobility  Bed Mobility Overal bed mobility: Needs Assistance       Supine to sit: Min assist Sit to supine: Min assist   General bed mobility comments: assist for L LE  Transfers Overall transfer level: Needs assistance Equipment used: Rolling walker (2 wheeled) Transfers: Sit to/from Stand Sit to Stand: Min guard         General transfer comment: assist for safety, remembered hand placement  Ambulation/Gait Ambulation/Gait assistance: Min assist;Min guard Ambulation Distance (Feet): 120 Feet Assistive device: Rolling walker (2 wheeled) Gait Pattern/deviations: Trunk flexed;Step-to pattern;Decreased stance time - left     General Gait Details: cues for positioning inside walker and for stride length, noted no  buckling of L knee, cues for forward gaze   Stairs            Wheelchair Mobility    Modified Rankin (Stroke Patients Only)       Balance Overall balance assessment: Needs assistance   Sitting balance-Leahy Scale: Good     Standing balance support: Bilateral upper extremity supported Standing balance-Leahy Scale: Poor Standing balance comment: UE support for balance                    Cognition Arousal/Alertness: Awake/alert Behavior During Therapy: WFL for tasks assessed/performed Overall Cognitive Status: Within Functional Limits for tasks assessed                      Exercises      General Comments General comments (skin integrity, edema, etc.): Patient educated on safety with home entry.  Reported there are cinderblocks for steps at her front entry.  discussed likely safer to sit on porch and have assist to rise rather than to use blocks if son unable to install ramp prior to d/c.     Vestibular Assessment  05/14/16 0001  Symptom Behavior  Type of Dizziness Spinning (at times light headed)  Frequency of Dizziness intermittent  Duration of Dizziness minutes to hours  Aggravating Factors Supine to sit;Sit to stand (closing eyes)  Relieving Factors Medication (Meclizine)  Occulomotor Exam  Occulomotor Alignment Normal  Spontaneous Absent  Gaze-induced Right beating nystagmus with R gaze (for two beats)  Head shaking Horizontal Absent  Smooth Pursuits Intact  Saccades Intact  Vestibulo-Occular  Reflex  VOR 1 Head Only (x 1 viewing) WNL vertical and horizontal, but needed assist first to improve stability as difficulty with keeping eyes stationary  VOR to Slow Head Movement Positive right  VOR Cancellation Normal  Auditory  Comments intact to scratch test and equal bilaterally, though does relate recently more difficulty hearing  Positional Testing  Sidelying Test Sidelying Right;Sidelying Left  Horizontal Canal Testing Horizontal Canal  Right;Horizontal Canal Left  Sidelying Right  Sidelying Right Duration 30 sec  Sidelying Right Symptoms No nystagmus  Sidelying Left  Sidelying Left Duration 30 sec  Sidelying Left Symptoms No nystagmus  Horizontal Canal Right  Horizontal Canal Right Duration 30 sec  Horizontal Canal Right Symptoms Normal  Horizontal Canal Left  Horizontal Canal Left Duration 30 sec  Horizontal Canal Left Symptoms Normal        Pertinent Vitals/Pain Faces Pain Scale: Hurts even more Pain Location: L hip and knee Pain Descriptors / Indicators: Aching;Tightness;Sore Pain Intervention(s): Monitored during session    Home Living                      Prior Function            PT Goals (current goals can now be found in the care plan section) Progress towards PT goals: Progressing toward goals    Frequency  Min 3X/week    PT Plan Current plan remains appropriate    Co-evaluation             End of Session Equipment Utilized During Treatment: Gait belt Activity Tolerance: Patient tolerated treatment well Patient left: in bed;with call bell/phone within reach     Time: 1110-1155 PT Time Calculation (min) (ACUTE ONLY): 45 min  Charges:  $Gait Training: 23-37 mins $Physical Performance Test: 8-22 mins                    G Codes:      Reginia Naas 2016-05-23, 1:46 PM  Magda Kiel, [place] 05/23/2016

## 2016-05-14 NOTE — Progress Notes (Signed)
Internal Medicine Attending:   I saw and examined the patient. I reviewed the resident's note and I agree with the resident's findings and plan as documented in the resident's note.  Clinically doing well, yesterday we finally had adequate urine output. Her renal function has been stable. Her pain is improving slowly. Her CK is downtrending. I think if her CK is below 2000 tomorrow she'll be okay for discharge back to home. Her ENA panel returned negative for autoantibodies so I think our presumption that this is a localized traumatic rhabdomyolysis is still the most likely. I gave her strong recommendation to drastically reduce her centrally acting medications as an outpatient, I think her combination of high-dose opiates with benzos directly led to her immobility causing this localized myositis. The patient acknowledged my recommendation and is going to follow-up with her pain doctor closely.

## 2016-05-14 NOTE — Progress Notes (Signed)
Occupational Therapy Treatment Patient Details Name: [name] MRN: [id number] DOB: Feb 12, 1966 Today's Date: 05/14/2016    History of present illness pt is a 50 y/o female with PMH of chronic pain, jHTN, CHF, CM, cardiac arrest, admitted with left-sided lower back pain radicular down to Left hip and buttock, MRI of Left hip showing myofascitis, suspected of an infectious nature.  Work up continues.   OT comments  Pt focused on relaying her long medical hx and concern that her aneurysm may burst. Encouraged pt to speak to MD. Pt moves well when pain is managed. Educated pt in availability of AE, but pt choosing to rely on family as needed rather than purchase equipment. Pt is aware of options for seated showering.   Follow Up Recommendations  Home health OT;Supervision - Intermittent    Equipment Recommendations  3 in 1 bedside comode    Recommendations for Other Services      Precautions / Restrictions Precautions Precautions: Fall Restrictions Weight Bearing Restrictions: No       Mobility Bed Mobility Overal bed mobility: Modified Independent          General bed mobility comments: increased time, used rail, but did not physically assist  Transfers Overall transfer level: Needs assistance Equipment used: Rolling walker (2 wheeled) Transfers: Sit to/from Stand Sit to Stand: Supervision         General transfer comment: used IV pole    Balance Overall balance assessment: Needs assistance   Sitting balance-Leahy Scale: Good     Standing balance support: Bilateral upper extremity supported Standing balance-Leahy Scale: Poor Standing balance comment: UE support for balance                   ADL Overall ADL's : Needs assistance/impaired     Grooming: Wash/dry hands;Brushing hair;Standing;Supervision/safety         Lower Body Bathing Details (indicate cue type and reason): recommended pt use her long bath sponge     Lower Body Dressing:  Minimal assistance;Sit to/from stand Lower Body Dressing Details (indicate cue type and reason): Pt educated in availability of AE. Pt will rely on her family. Toilet Transfer: Supervision/safety;Ambulation;BSC   Toileting- Water quality scientist and Hygiene: Supervision/safety;Sit to/from Nurse, children's Details (indicate cue type and reason): educated pt in use of plastic lawn chair or 3 in 1 for shower seat Functional mobility during ADLs: Supervision/safety (pushed IV pole) General ADL Comments: Pt is aware she can purchase AE for LB ADL.      Vision                     Perception     Praxis      Cognition   Behavior During Therapy: Flat affect Overall Cognitive Status: Within Functional Limits for tasks assessed                       Extremity/Trunk Assessment               Exercises     Shoulder Instructions       General Comments      Pertinent Vitals/ Pain       Pain Assessment: No/denies pain Faces Pain Scale: Hurts even more Pain Location: L hip and knee Pain Descriptors / Indicators: Aching;Tightness;Sore Pain Intervention(s): Monitored during session  Home Living  Prior Functioning/Environment              Frequency Min 2X/week     Progress Toward Goals  OT Goals(current goals can now be found in the care plan section)  Progress towards OT goals: Progressing toward goals  Acute Rehab OT Goals Patient Stated Goal: home on Monday Time For Goal Achievement: 05/18/16 Potential to Achieve Goals: Good  Plan Discharge plan remains appropriate    Co-evaluation                 End of Session Equipment Utilized During Treatment: Gait belt   Activity Tolerance Patient tolerated treatment well   Patient Left in bed;with call bell/phone within reach;with bed alarm set   Nurse Communication  (RN replaced IV fluids)        Time:  [id number] OT Time Calculation (min): 20 min  Charges: OT General Charges $OT Visit: 1 Procedure OT Treatments $Self Care/Home Management : 8-22 mins  [name] 05/14/2016, 4:56 PM  [phone]

## 2016-05-14 NOTE — Progress Notes (Signed)
   Subjective: The patient was evaluated this morning on rounds. She states that she continues to have left lateral thigh pain but that her pain has overall improved.  She describes a sensation of tightness in the lateral aspect of her left leg. She is able to ambulate with the aid of a walker. She denies shortness of breath or chest pain.  Objective:  Vital signs in last 24 hours: Vitals:   05/13/16 0700 05/13/16 1448 05/13/16 2104 05/14/16 0504  BP: 120/68 (!) 149/79 140/72 (!) 142/79  Pulse: 78 84 80 86  Resp: 18 18 18 18   Temp: 98.2 F (36.8 C) 98.5 F (36.9 C) 98.2 F (36.8 C) 98 F (36.7 C)  TempSrc:  Oral  Oral  SpO2: 91% 98% 97% 99%  Weight:      Height:       Physical Exam Constitutional:  Alert, engaged in conversation and speaking in complete sentences. Cardiovascular: Normal rate, regular rhythmand normal heart sounds. Exam reveals no gallopand no friction rub.  No murmurheard. Pulmonary/Chest:  Clear to auscultation bilaterally with no wheezes, rhonchi or rales.  Effort is normal. Musculoskeletal: Tenderness to palpation on left lower back.      Assessment/Plan:  Principal Problem:   Myositis Active Problems:   Hypothyroidism   Hyperlipidemia   Essential hypertension   Addison's disease ([place])   CAD (coronary artery disease)   GERD (gastroesophageal reflux disease)   Anxiety  Left sided myositis On exam patient is no longer tender to palpation on left lateral thigh or her left buttocks. She continues to endorse lower back pain and this is chronic. CK continues to improve and is 2443.  Patient has had good urine output and renal function is excellent. ANA and anti-jo antibodies were negative. - Continue to trend CK  - Lactated ringer 216ml/hr - Monitor I/Os -Continue Flexeril  Chronic back pain Restarted home narcotic medication - Oxycodone IR 30mg  Q6 PRN  Anxiety Patient states that she is having phone conversations that are anxiety provoking  for her. She is on lorazepam 2 mg Q4PRN at home -Lorazepam 2mg  once  HTN BP elevated at 142/79 - Restarted home blood pressure medication, amlodipine 5mg  daily  Hypothyroidism Stable, On levothyroxine 34mcg at home - Continue home medication   Dispo: Anticipated discharge in approximately 1 day(s).   Valinda Party, DO 05/14/2016, 1:39 PM Pager: [phone]

## 2016-05-15 LAB — BASIC METABOLIC PANEL
ANION GAP: 10 (ref 5–15)
BUN: 7 mg/dL (ref 6–20)
CO2: 27 mmol/L (ref 22–32)
Calcium: 8.5 mg/dL — ABNORMAL LOW (ref 8.9–10.3)
Chloride: 106 mmol/L (ref 101–111)
Creatinine, Ser: 0.71 mg/dL (ref 0.44–1.00)
GFR calc Af Amer: >60 mL/min (ref >60)
GFR calc non Af Amer: >60 mL/min (ref >60)
GLUCOSE: 81 mg/dL (ref 65–99)
POTASSIUM: 3.8 mmol/L (ref 3.5–5.1)
Sodium: 143 mmol/L (ref 135–145)

## 2016-05-15 LAB — CK: Total CK: 1910 U/L — ABNORMAL HIGH (ref 38–234)

## 2016-05-15 LAB — CULTURE, BLOOD (ROUTINE X 2)
Culture: NO GROWTH
Culture: NO GROWTH

## 2016-05-15 MED ORDER — SODIUM CHLORIDE 0.9 % IV SOLN
1.0000 g | Freq: Once | INTRAVENOUS | Status: AC
  Administered 2016-05-15: 1 g via INTRAVENOUS
  Filled 2016-05-15: qty 10

## 2016-05-15 MED ORDER — CYCLOBENZAPRINE HCL 5 MG PO TABS: 10.0000 mg | ORAL_TABLET | Freq: Three times a day (TID) | ORAL | Status: DC | PRN

## 2016-05-15 MED ORDER — ZOLPIDEM TARTRATE 5 MG PO TABS: ORAL_TABLET | ORAL | Status: DC

## 2016-05-15 MED ORDER — DIAZEPAM 10 MG PO TABS: 10.0000 mg | ORAL_TABLET | Freq: Once | ORAL | 0 refills | Status: DC | PRN

## 2016-05-15 MED ORDER — MAGNESIUM SULFATE 2 GM/50ML IV SOLN
2.0000 g | Freq: Once | INTRAVENOUS | Status: AC
  Administered 2016-05-15: 2 g via INTRAVENOUS
  Filled 2016-05-15: qty 50

## 2016-05-15 MED ORDER — OXYCODONE HCL 15 MG PO TABS: 30.0000 mg | ORAL_TABLET | ORAL | Status: DC | PRN

## 2016-05-15 NOTE — Progress Notes (Signed)
   Subjective: Patient was evaluated this morning on rounds. She states that she continues to have improvement with her left-sided thigh pain.  She is able to walk to her bedside commode and ambulate with the help of a walker. She states that she had some dizziness today that is similar to her chronic vertigo.  Objective:  Vital signs in last 24 hours: Vitals:   05/14/16 1500 05/14/16 2141 05/15/16 0545 05/15/16 0722  BP: 130/72 (!) 149/80 140/69 (!) 147/99  Pulse: 80 83 73 71  Resp: 20 18 18    Temp: 97.6 F (36.4 C) 98.2 F (36.8 C) 98.2 F (36.8 C)   TempSrc: Oral Oral Oral   SpO2: 96% 97% 95% 99%  Weight:      Height:       Physical Exam  Constitutional: She is well-developed, well-nourished, and in no distress.  Cardiovascular: Normal rate, regular rhythm and normal heart sounds.  Exam reveals no gallop and no friction rub.   No murmur heard. Pulmonary/Chest: Effort normal.  Clear to auscultation bilaterally no wheezes, rhonchi, or rales  Musculoskeletal: She exhibits no edema.  No tenderness noted to the lateral left thigh  Skin: Skin is warm and dry.    Assessment/Plan:  Principal Problem:   Myositis Active Problems:   Hypothyroidism   Hyperlipidemia   Essential hypertension   Addison's disease ([place])   CAD (coronary artery disease)   GERD (gastroesophageal reflux disease)   Anxiety  Left sided myositis Patient states that her left-sided lower back and leg pain continues to improve. Her CK continues to trend downwards and is currently 1910. -Discharge to home today with PT and OT follow-up -Advised patient to reduce pain medication and to talk with primary care physician about decreasing the dose.  Also counseled patient to avoid combining benzodiazepines and narcotics. - made recommendations on discharge to decrease medication   Chronic back pain:  Stable - Oxycodone IR 30mg  Q6 PRN  HTN BP 140/69 - amlodipine 5mg  daily  Hypocalcemia: slight decrease at  8.5 -IV 1g calcium gluconate   Hypomagnesemia:  Mag 1.5, probable due to poor nutrition  -2g IV magnesium sulfate  Hypothyroidism Stable, On levothyroxine 79mcg at home - Continue home medication   Dispo: Anticipated discharge today  Valinda Party, DO 05/15/2016, 10:21 AM Pager: [phone]

## 2016-05-15 NOTE — Progress Notes (Signed)
   05/15/16 0730  What Happened  Was fall witnessed? No  Was patient injured? No  Patient found on floor  Found by Staff-comment  Stated prior activity bathroom-unassisted  Follow Up  MD notified Evette Doffing  Time MD notified 0800  Family notified No- patient refusal  Additional tests No (residents discussing with team)  Progress note created (see row info) Yes  Adult Fall Risk Assessment  Risk Factor Category (scoring not indicated) Not Applicable  Age 50  Fall History: Fall within 6 months prior to admission 5  Elimination; Bowel and/or Urine Incontinence 0  Elimination; Bowel and/or Urine Urgency/Frequency 2  Medications: includes PCA/Opiates, Anti-convulsants, Anti-hypertensives, Diuretics, Hypnotics, Laxatives, Sedatives, and Psychotropics 3  Patient Care Equipment 1  Mobility-Assistance 2  Mobility-Gait 2  Mobility-Sensory Deficit 0  Altered awareness of immediate physical environment 0  Impulsiveness 2  Lack of understanding of one's physical/cognitive limitations 4  Total Score 21  Patient's Fall Risk High Fall Risk (>13 points)  Adult Fall Risk Interventions  Required Bundle Interventions *See Row Information* High fall risk - low, moderate, and high requirements implemented  Additional Interventions Individualized elimination schedule;Use of appropriate toileting equipment (bedpan, BSC, etc.)  Screening for Fall Injury Risk  Risk For Fall Injury- See Row Information  Nurse judgement  Neurological  Neuro (WDL) X  Level of Consciousness Alert  Orientation Level Oriented X4  Cognition Follows commands;Appropriate at baseline;Appropriate attention/concentration;Appropriate judgement;Appropriate safety awareness  Speech Clear  Pupil Assessment  Yes  R Pupil Size (mm) 4  R Pupil Shape Round  R Pupil Reaction Sluggish  L Pupil Size (mm) 4  L Pupil Shape Round  L Pupil Reaction Sluggish  Additional Pupil Assessments No  Motor Function/Sensation Assessment Grip;Pronator  drift;Dorsiflexion;Plantar flexion;Motor strength;Motor response  Facial Symmetry Symmetrical  R Hand Grip Present;Moderate  L Hand Grip Present;Moderate   Right Pronator Drift Absent  Left Pronator Drift Absent  R Foot Dorsiflexion Present;Moderate  L Foot Dorsiflexion Present;Moderate  R Foot Plantar Flexion Present;Moderate  L Foot Plantar Flexion Present;Moderate  RUE Motor Response Purposeful movement  RUE Sensation Full sensation  RUE Motor Strength 5  LUE Motor Response Purposeful movement  LUE Sensation Full sensation  LUE Motor Strength 5  RLE Motor Response Purposeful movement  RLE Sensation Full sensation  RLE Motor Strength 5  LLE Motor Response Purposeful movement (leg is painful,reason for admission)  LLE Sensation Full sensation  LLE Motor Strength Other (Comment) (leg is painful,reason for admission)

## 2016-05-15 NOTE — Progress Notes (Signed)
Internal Medicine Attending:   I saw and examined the patient. I reviewed the resident's note and I agree with the resident's findings and plan as documented in the resident's note.  Doing well today. Pain is much improved. CK has down trended nicely and she should have very low risk of renal injury from here. [place] for discharge to home. We recommended strongly that she reduce opiate and benzo medications at home.

## 2016-05-17 DIAGNOSIS — Z7982 Long term (current) use of aspirin: Secondary | ICD-10-CM | POA: Diagnosis not present

## 2016-05-17 DIAGNOSIS — E273 Drug-induced adrenocortical insufficiency: Secondary | ICD-10-CM | POA: Diagnosis not present

## 2016-05-17 DIAGNOSIS — F419 Anxiety disorder, unspecified: Secondary | ICD-10-CM | POA: Diagnosis not present

## 2016-05-17 DIAGNOSIS — M609 Myositis, unspecified: Secondary | ICD-10-CM | POA: Diagnosis not present

## 2016-05-17 DIAGNOSIS — E271 Primary adrenocortical insufficiency: Secondary | ICD-10-CM | POA: Diagnosis not present

## 2016-05-17 DIAGNOSIS — Z23 Encounter for immunization: Secondary | ICD-10-CM | POA: Diagnosis not present

## 2016-05-17 DIAGNOSIS — K219 Gastro-esophageal reflux disease without esophagitis: Secondary | ICD-10-CM | POA: Diagnosis not present

## 2016-05-17 DIAGNOSIS — I509 Heart failure, unspecified: Secondary | ICD-10-CM | POA: Diagnosis not present

## 2016-05-17 DIAGNOSIS — I10 Essential (primary) hypertension: Secondary | ICD-10-CM | POA: Diagnosis not present

## 2016-05-17 DIAGNOSIS — R739 Hyperglycemia, unspecified: Secondary | ICD-10-CM | POA: Diagnosis not present

## 2016-05-17 DIAGNOSIS — I110 Hypertensive heart disease with heart failure: Secondary | ICD-10-CM | POA: Diagnosis not present

## 2016-05-17 DIAGNOSIS — E039 Hypothyroidism, unspecified: Secondary | ICD-10-CM | POA: Diagnosis not present

## 2016-05-17 DIAGNOSIS — I2510 Atherosclerotic heart disease of native coronary artery without angina pectoris: Secondary | ICD-10-CM | POA: Diagnosis not present

## 2016-05-17 DIAGNOSIS — E785 Hyperlipidemia, unspecified: Secondary | ICD-10-CM | POA: Diagnosis not present

## 2016-05-18 DIAGNOSIS — I2510 Atherosclerotic heart disease of native coronary artery without angina pectoris: Secondary | ICD-10-CM | POA: Diagnosis not present

## 2016-05-18 DIAGNOSIS — I509 Heart failure, unspecified: Secondary | ICD-10-CM | POA: Diagnosis not present

## 2016-05-18 DIAGNOSIS — K219 Gastro-esophageal reflux disease without esophagitis: Secondary | ICD-10-CM | POA: Diagnosis not present

## 2016-05-18 DIAGNOSIS — E271 Primary adrenocortical insufficiency: Secondary | ICD-10-CM | POA: Diagnosis not present

## 2016-05-18 DIAGNOSIS — H838X3 Other specified diseases of inner ear, bilateral: Secondary | ICD-10-CM | POA: Diagnosis not present

## 2016-05-18 DIAGNOSIS — M609 Myositis, unspecified: Secondary | ICD-10-CM | POA: Diagnosis not present

## 2016-05-18 DIAGNOSIS — R42 Dizziness and giddiness: Secondary | ICD-10-CM | POA: Diagnosis not present

## 2016-05-18 DIAGNOSIS — I110 Hypertensive heart disease with heart failure: Secondary | ICD-10-CM | POA: Diagnosis not present

## 2016-05-19 DIAGNOSIS — I2510 Atherosclerotic heart disease of native coronary artery without angina pectoris: Secondary | ICD-10-CM | POA: Diagnosis not present

## 2016-05-19 DIAGNOSIS — E271 Primary adrenocortical insufficiency: Secondary | ICD-10-CM | POA: Diagnosis not present

## 2016-05-19 DIAGNOSIS — K219 Gastro-esophageal reflux disease without esophagitis: Secondary | ICD-10-CM | POA: Diagnosis not present

## 2016-05-19 DIAGNOSIS — I509 Heart failure, unspecified: Secondary | ICD-10-CM | POA: Diagnosis not present

## 2016-05-19 DIAGNOSIS — M609 Myositis, unspecified: Secondary | ICD-10-CM | POA: Diagnosis not present

## 2016-05-19 DIAGNOSIS — I110 Hypertensive heart disease with heart failure: Secondary | ICD-10-CM | POA: Diagnosis not present

## 2016-05-20 DIAGNOSIS — I110 Hypertensive heart disease with heart failure: Secondary | ICD-10-CM | POA: Diagnosis not present

## 2016-05-20 DIAGNOSIS — E271 Primary adrenocortical insufficiency: Secondary | ICD-10-CM | POA: Diagnosis not present

## 2016-05-20 DIAGNOSIS — K219 Gastro-esophageal reflux disease without esophagitis: Secondary | ICD-10-CM | POA: Diagnosis not present

## 2016-05-20 DIAGNOSIS — M609 Myositis, unspecified: Secondary | ICD-10-CM | POA: Diagnosis not present

## 2016-05-20 DIAGNOSIS — I2510 Atherosclerotic heart disease of native coronary artery without angina pectoris: Secondary | ICD-10-CM | POA: Diagnosis not present

## 2016-05-20 DIAGNOSIS — I509 Heart failure, unspecified: Secondary | ICD-10-CM | POA: Diagnosis not present

## 2016-05-21 DIAGNOSIS — K219 Gastro-esophageal reflux disease without esophagitis: Secondary | ICD-10-CM | POA: Diagnosis not present

## 2016-05-21 DIAGNOSIS — M609 Myositis, unspecified: Secondary | ICD-10-CM | POA: Diagnosis not present

## 2016-05-21 DIAGNOSIS — I2510 Atherosclerotic heart disease of native coronary artery without angina pectoris: Secondary | ICD-10-CM | POA: Diagnosis not present

## 2016-05-21 DIAGNOSIS — I110 Hypertensive heart disease with heart failure: Secondary | ICD-10-CM | POA: Diagnosis not present

## 2016-05-21 DIAGNOSIS — E271 Primary adrenocortical insufficiency: Secondary | ICD-10-CM | POA: Diagnosis not present

## 2016-05-21 DIAGNOSIS — I509 Heart failure, unspecified: Secondary | ICD-10-CM | POA: Diagnosis not present

## 2016-05-21 NOTE — Discharge Summary (Signed)
Name: [name] MRN: [id number] DOB: July 17, 1966 50 y.o. PCP: Redmond School, MD  Date of Admission: 05/10/2016  6:22 AM Date of Discharge: 05/15/2016 Attending Physician: Lalla Brothers MD Discharge Diagnosis: 1. Left sided myositis  Principal Problem:   Myositis Active Problems:   Hypothyroidism   Hyperlipidemia   Essential hypertension   Addison's disease (HCC)   CAD (coronary artery disease)   GERD (gastroesophageal reflux disease)   Anxiety   Discharge Medications:   Medication List    STOP taking these medications   LORazepam 2 MG tablet Commonly known as:  ATIVAN     TAKE these medications   amLODipine 5 MG tablet Commonly known as:  NORVASC TAKE 1 TABLET (5 MG TOTAL) BY MOUTH DAILY.   aspirin 81 MG chewable tablet Chew 81 mg by mouth daily.   cholecalciferol 1000 units tablet Commonly known as:  VITAMIN D Take 1,000 Units by mouth daily.   cyclobenzaprine 5 MG tablet Commonly known as:  FLEXERIL Take 2 tablets (10 mg total) by mouth 3 (three) times daily as needed for muscle spasms. What changed:  medication strength   diazepam 10 MG tablet Commonly known as:  VALIUM Take 1 tablet (10 mg total) by mouth once as needed for anxiety. #42 filled 04/26/16 What changed:  when to take this  Another medication with the same name was removed. Continue taking this medication, and follow the directions you see here.   hydrocortisone 10 MG tablet Commonly known as:  CORTEF Take 10 mg by mouth See admin instructions. Take up to 6 tablets daily   levothyroxine 75 MCG tablet Commonly known as:  SYNTHROID, LEVOTHROID Take 75 mcg by mouth daily before breakfast.   meclizine 25 MG tablet Commonly known as:  ANTIVERT Take 1 tablet (25 mg total) by mouth 4 (four) times daily as needed for dizziness. What changed:  Another medication with the same name was changed. Make sure you understand how and when to take each.   meclizine 25 MG tablet Commonly known  as:  ANTIVERT 1 or 2 tabs PO q8h prn dizziness What changed:  how much to take  how to take this  when to take this  reasons to take this  additional instructions   nitroGLYCERIN 0.4 MG SL tablet Commonly known as:  NITROSTAT Place 0.4 mg under the tongue every 5 (five) minutes as needed for chest pain.   ondansetron 4 MG disintegrating tablet Commonly known as:  ZOFRAN ODT Take 1 tablet (4 mg total) by mouth every 8 (eight) hours as needed for nausea or vomiting.   oxyCODONE 15 MG immediate release tablet Commonly known as:  ROXICODONE Take 2 tablets (30 mg total) by mouth every 4 (four) hours as needed for pain. What changed:  medication strength   pantoprazole 20 MG tablet Commonly known as:  PROTONIX Take 1 tablet (20 mg total) by mouth daily.   vitamin C 500 MG tablet Commonly known as:  ASCORBIC ACID Take 500 mg by mouth daily.   zolpidem 5 MG tablet Commonly known as:  AMBIEN 5mg  by mouth at bedtime as needed for sleep What changed:  medication strength  how much to take  how to take this  when to take this  reasons to take this  additional instructions       Disposition and follow-up:   [name] was discharged from [facility] in stable condition.  At the hospital follow up visit please address:  1.  Recommend decrease in narcotic and benzodiazepine regimen   2.  Labs / imaging needed at time of follow-up: BMP, CK  3.  Pending labs/ test needing follow-up: none  Follow-up Appointments: Follow-up Information    Glo Herring., MD .   Specialty:  Internal Medicine Contact information: [address] [place] [id number] [place] [facility] Course by problem list: Principal Problem:   Myositis Active Problems:   Hypothyroidism   Hyperlipidemia   Essential hypertension   Addison's disease ([place])   CAD (coronary artery disease)   GERD (gastroesophageal reflux disease)   Anxiety     Left sided Myositis Patient has a history of chronic back pain for which she takes narcotic medication. On day of admission she stated that she had new onset pain that started in her left buttock's and left lateral thigh and continued radiating down the leg. When she arrived to the ED she was somnolent and would not arouse other than to sternal rub. She was given Narcan and became more alert.  Patient was able to report later on admission that she had fallen asleep on the toilet for 4 hours after taking her medications. MRI of hip and lumbar spine showed extensive muscle edema around the left buttocks and thigh compatible with myositis. There was severe myofasciitis involving the left hip and pelvic musculature. Patient's CK was 5748 and she was given IV fluids and maintained excellent renal function during entire admission. On discharge her CK was 1910.  ANA and anti-jo antibodies were negative. Throughout her stay her pain improved and she was able to ambulate with the help of a walker.  She was treated initially with flexeril and toradol. Her home narcotic medication was held and restarted later in the admission.  She was discharged with PT and OT follow-up.   Fever/Leukocytosis On admission patient had a temperature of 101.4 leukocytosis of 17.9. Urinalysis was negative for UTI. Chest x-ray showed persistent atelectasis, no infectious process. She had been started on broad-spectrum antibiotics but was discontinued on day 2.  Her fever and leukocytosis resolved and was likely a stress reaction from the rhabdomyolysis.  Hypokalemia  Patient became hypokalemic during admission and was replenished and discharged with a potassium of 3.8 her magnesium level was 1.4 and was also replaced.  Hypocalcemia Slightly decrease during admission at 8.5 and was given IV 1g calcium gluconate   Chronic back pain During admission she was restarted on her home narcotic medication of Oxycodone IR 30mg  Q6 PRN.  On  discharge gave recommendations to decrease daily narcotic use.  Anxiety Patient received 1 dose of her lorazepam 2 mg during admission she stated she was having anxiety from social situations. On discharge gave recommendations to decrease daily benzodiazepine use.  HTN Patient's blood pressure was elevated in the 140s/70 during admission and was restarted on her home blood pressure medication, amlodipine 5mg  daily  Hypothyroidism  Stable during admission and continued her home levothyroxine 75 MCG daily   Discharge Vitals:   BP (!) 150/78 (BP Location: Right Arm)   Pulse 76   Temp 98.7 F (37.1 C) (Oral)   Resp 17   Ht 5\' 5"  (1.651 m)   Wt 180 lb (81.6 kg)   SpO2 97%   BMI 29.95 kg/m    Discharge Instructions: Discharge Instructions    Diet - low sodium heart healthy    Complete by:  As directed    Discharge instructions    Complete  by:  As directed    Please follow up with your PCP for any change in pain and to discuss current pain and anxiety regimen   Increase activity slowly    Complete by:  As directed       Signed: Valinda Party, DO 05/21/2016, 10:35 AM   Pager: [phone]

## 2016-05-25 ENCOUNTER — Encounter (HOSPITAL_COMMUNITY): Payer: Self-pay

## 2016-05-25 ENCOUNTER — Emergency Department (HOSPITAL_COMMUNITY)
Admission: EM | Admit: 2016-05-25 | Discharge: 2016-05-25 | Disposition: A | Discharge status: Home / Self Care | Payer: Medicare Other | Attending: Emergency Medicine | Admitting: Emergency Medicine

## 2016-05-25 ENCOUNTER — Emergency Department (HOSPITAL_COMMUNITY): Payer: Medicare Other

## 2016-05-25 DIAGNOSIS — F17210 Nicotine dependence, cigarettes, uncomplicated: Secondary | ICD-10-CM | POA: Diagnosis not present

## 2016-05-25 DIAGNOSIS — R000 Tachycardia, unspecified: Secondary | ICD-10-CM | POA: Insufficient documentation

## 2016-05-25 DIAGNOSIS — I2510 Atherosclerotic heart disease of native coronary artery without angina pectoris: Secondary | ICD-10-CM | POA: Diagnosis not present

## 2016-05-25 DIAGNOSIS — M609 Myositis, unspecified: Secondary | ICD-10-CM | POA: Diagnosis not present

## 2016-05-25 DIAGNOSIS — Z7982 Long term (current) use of aspirin: Secondary | ICD-10-CM | POA: Insufficient documentation

## 2016-05-25 DIAGNOSIS — E860 Dehydration: Secondary | ICD-10-CM | POA: Insufficient documentation

## 2016-05-25 DIAGNOSIS — I509 Heart failure, unspecified: Secondary | ICD-10-CM | POA: Insufficient documentation

## 2016-05-25 DIAGNOSIS — Z79899 Other long term (current) drug therapy: Secondary | ICD-10-CM | POA: Insufficient documentation

## 2016-05-25 DIAGNOSIS — R531 Weakness: Secondary | ICD-10-CM | POA: Diagnosis not present

## 2016-05-25 DIAGNOSIS — K219 Gastro-esophageal reflux disease without esophagitis: Secondary | ICD-10-CM | POA: Diagnosis not present

## 2016-05-25 DIAGNOSIS — R0602 Shortness of breath: Secondary | ICD-10-CM | POA: Insufficient documentation

## 2016-05-25 DIAGNOSIS — I110 Hypertensive heart disease with heart failure: Secondary | ICD-10-CM | POA: Insufficient documentation

## 2016-05-25 DIAGNOSIS — E039 Hypothyroidism, unspecified: Secondary | ICD-10-CM | POA: Insufficient documentation

## 2016-05-25 DIAGNOSIS — R0600 Dyspnea, unspecified: Secondary | ICD-10-CM

## 2016-05-25 DIAGNOSIS — E271 Primary adrenocortical insufficiency: Secondary | ICD-10-CM | POA: Diagnosis not present

## 2016-05-25 LAB — CBC WITH DIFFERENTIAL/PLATELET
BASOS ABS: 0.2 10*3/uL — AB (ref 0.0–0.1)
BASOS PCT: 1 %
Eosinophils Absolute: 0.2 10*3/uL (ref 0.0–0.7)
Eosinophils Relative: 1 %
HEMATOCRIT: 44.1 % (ref 36.0–46.0)
HEMOGLOBIN: 14.7 g/dL (ref 12.0–15.0)
LYMPHS PCT: 19 %
Lymphs Abs: 2.9 10*3/uL (ref 0.7–4.0)
MCH: 28.5 pg (ref 26.0–34.0)
MCHC: 33.3 g/dL (ref 30.0–36.0)
MCV: 85.5 fL (ref 78.0–100.0)
MONOS PCT: 6 %
Monocytes Absolute: 0.9 10*3/uL (ref 0.1–1.0)
NEUTROS ABS: 11.3 10*3/uL — AB (ref 1.7–7.7)
Neutrophils Relative %: 73 %
Platelets: 607 10*3/uL — ABNORMAL HIGH (ref 150–400)
RBC: 5.16 MIL/uL — ABNORMAL HIGH (ref 3.87–5.11)
RDW: 14.2 % (ref 11.5–15.5)
WBC: 15.5 10*3/uL — ABNORMAL HIGH (ref 4.0–10.5)

## 2016-05-25 LAB — TROPONIN I: Troponin I: <0.03 ng/mL (ref <0.03)

## 2016-05-25 LAB — COMPREHENSIVE METABOLIC PANEL
ALBUMIN: 3.9 g/dL (ref 3.5–5.0)
ALT: 19 U/L (ref 14–54)
ANION GAP: 10 (ref 5–15)
AST: 24 U/L (ref 15–41)
Alkaline Phosphatase: 84 U/L (ref 38–126)
BUN: 15 mg/dL (ref 6–20)
CHLORIDE: 100 mmol/L — AB (ref 101–111)
CO2: 27 mmol/L (ref 22–32)
Calcium: 9.7 mg/dL (ref 8.9–10.3)
Creatinine, Ser: 1.14 mg/dL — ABNORMAL HIGH (ref 0.44–1.00)
GFR calc Af Amer: >60 mL/min (ref >60)
GFR calc non Af Amer: 54 mL/min — ABNORMAL LOW (ref >60)
GLUCOSE: 104 mg/dL — AB (ref 65–99)
POTASSIUM: 4.2 mmol/L (ref 3.5–5.1)
SODIUM: 137 mmol/L (ref 135–145)
Total Bilirubin: 0.6 mg/dL (ref 0.3–1.2)
Total Protein: 8.5 g/dL — ABNORMAL HIGH (ref 6.5–8.1)

## 2016-05-25 LAB — URINALYSIS, ROUTINE W REFLEX MICROSCOPIC
Bilirubin Urine: NEGATIVE
Glucose, UA: NEGATIVE mg/dL
Hgb urine dipstick: NEGATIVE
KETONES UR: NEGATIVE mg/dL
Leukocytes, UA: NEGATIVE
NITRITE: NEGATIVE
PH: 7 (ref 5.0–8.0)
Protein, ur: NEGATIVE mg/dL
Specific Gravity, Urine: 1.012 (ref 1.005–1.030)

## 2016-05-25 LAB — BRAIN NATRIURETIC PEPTIDE: B NATRIURETIC PEPTIDE 5: 13.1 pg/mL (ref 0.0–100.0)

## 2016-05-25 LAB — CK: CK TOTAL: 78 U/L (ref 38–234)

## 2016-05-25 LAB — D-DIMER, QUANTITATIVE: D-Dimer, Quant: 1.8 ug/mL-FEU — ABNORMAL HIGH (ref 0.00–0.50)

## 2016-05-25 MED ORDER — IOPAMIDOL (ISOVUE-370) INJECTION 76%: 100.0000 mL | Freq: Once | INTRAVENOUS | Status: DC | PRN

## 2016-05-25 MED ORDER — IOPAMIDOL (ISOVUE-370) INJECTION 76%
100.0000 mL | Freq: Once | INTRAVENOUS | Status: AC | PRN
  Administered 2016-05-25: 100 mL via INTRAVENOUS

## 2016-05-25 MED ORDER — FENTANYL CITRATE (PF) 100 MCG/2ML IJ SOLN
100.0000 ug | Freq: Once | INTRAMUSCULAR | Status: AC
  Administered 2016-05-25: 100 ug via INTRAVENOUS
  Filled 2016-05-25: qty 2

## 2016-05-25 MED ORDER — ONDANSETRON HCL 4 MG/2ML IJ SOLN
4.0000 mg | Freq: Once | INTRAMUSCULAR | Status: AC
  Administered 2016-05-25: 4 mg via INTRAVENOUS
  Filled 2016-05-25: qty 2

## 2016-05-25 MED ORDER — OXYCODONE HCL 5 MG PO TABS
15.0000 mg | ORAL_TABLET | Freq: Once | ORAL | Status: AC
  Administered 2016-05-25: 15 mg via ORAL
  Filled 2016-05-25: qty 3

## 2016-05-25 NOTE — ED Provider Notes (Signed)
[place] DEPT Provider Note   CSN: [id number] Arrival date & time: 05/25/16  1127     History   Chief Complaint Chief Complaint  Patient presents with  . Shortness of Breath    HPI [name] is a 50 y.o. female.  Pt presents to the ED today with sob and weakness.  She was admitted from 9/4-9/9 with myositis that resulted from falling asleep on the toilet, after taking her pain meds, and staying there for hours.  She had a fever initially on admission, but sepsis was ruled out and no source for fever was found.  The pt is able to ambulate with a walker, but has had intermittent fevers since d/c.  She said that she feels like she was sent home too soon.      Past Medical History:  Diagnosis Date  . Adrenal insufficiency ([place])    diagnosed 2012  . Anxiety   . Astigmatism   . CAD (coronary artery disease)    Cath 2008 EF normal. RCA 50-60, Septal 50%. Myoview 3/12: EF 53% normal perfusion  . Cardiac arrest ([place])    2/2 adissonian crisis  . Cardiomyopathy    resolved  . Chest pain    chronicc  . CHF (congestive heart failure) ([place])   . Chronic back pain   . Chronic diarrhea   . Concussion    sept 28th 2014  . Gastroparesis   . HTN (hypertension)   . Hyperlipidemia   . Hypothyroidism   . Mitral valve prolapse   . Nondiabetic gastroparesis   . QT prolongation   . Tobacco abuse    down to 2 cigarettes per day    Patient Active Problem List   Diagnosis Date Noted  . Myositis 05/11/2016  . Anxiety 05/26/2015  . Tremors of nervous system 05/26/2015  . Neck pain 05/26/2015  . Hot flushes, perimenopausal 07/24/2013  . CAD (coronary artery disease) 04/11/2012  . GERD (gastroesophageal reflux disease) 04/11/2012  . Addison's disease ([place]) 03/03/2012  . QT prolongation 12/15/2010  . Palpitations 01/01/2009  . Hypothyroidism 12/31/2008  . Hyperlipidemia 12/31/2008  . OVERWEIGHT/OBESITY 12/31/2008  . Essential hypertension 12/31/2008    Past Surgical  History:  Procedure Laterality Date  . ABDOMINAL HYSTERECTOMY    . CARDIAC CATHETERIZATION  03/2007   showed 60% lesion in the right coronary artery  . CHOLECYSTECTOMY    . LEFT HEART CATHETERIZATION WITH CORONARY ANGIOGRAM N/A 04/13/2012   Procedure: LEFT HEART CATHETERIZATION WITH CORONARY ANGIOGRAM;  Surgeon: Hillary Bow, MD;  Location[facility];  Service: Cardiovascular;  Laterality: N/A;  . SPINE SURGERY    . VARICOSE VEIN SURGERY    . VESICOVAGINAL FISTULA CLOSURE W/ TAH      OB History    Gravida Para Term Preterm AB Living   3 2 2  0 1 2   SAB TAB Ectopic Multiple Live Births   1               Home Medications    Prior to Admission medications   Medication Sig Start Date End Date Taking? Authorizing Provider  amLODipine (NORVASC) 5 MG tablet TAKE 1 TABLET (5 MG TOTAL) BY MOUTH DAILY. 07/25/15  Yes Pixie Casino, MD  aspirin 81 MG chewable tablet Chew 81 mg by mouth daily.    Yes Historical Provider, MD  cholecalciferol (VITAMIN D) 1000 UNITS tablet Take 1,000 Units by mouth daily.   Yes Historical Provider, MD  cyclobenzaprine (FLEXERIL) 10 MG tablet Take  10 mg by mouth 3 (three) times daily as needed for muscle spasms. 05/06/16  Yes Historical Provider, MD  hydrochlorothiazide (HYDRODIURIL) 12.5 MG tablet Take 7.25 mg by mouth daily as needed (for High Blood Pressure >145/98).   Yes Historical Provider, MD  hydrocortisone (CORTEF) 10 MG tablet Take 10-20 mg by mouth 2 (two) times daily. Takes 20 mg in the morning and 10 mg at night 04/07/16  Yes Historical Provider, MD  levothyroxine (SYNTHROID, LEVOTHROID) 75 MCG tablet Take 75 mcg by mouth daily before breakfast.   Yes Historical Provider, MD  LORazepam (ATIVAN) 2 MG tablet Take 2 mg by mouth 4 (four) times daily as needed for anxiety. 05/06/16  Yes Historical Provider, MD  meclizine (ANTIVERT) 25 MG tablet 1 or 2 tabs PO q8h prn dizziness Patient taking differently: Take 25-50 mg by mouth 3 (three) times daily as  needed (for vertigo).  04/16/16  Yes Francine Graven, DO  nitroGLYCERIN (NITROSTAT) 0.4 MG SL tablet Place 0.4 mg under the tongue every 5 (five) minutes as needed for chest pain.   Yes Historical Provider, MD  oxycodone (ROXICODONE) 30 MG immediate release tablet Take 30 mg by mouth every 4 (four) hours as needed for pain.   Yes Historical Provider, MD  vitamin C (ASCORBIC ACID) 500 MG tablet Take 500 mg by mouth daily.   Yes Historical Provider, MD  zolpidem (AMBIEN) 10 MG tablet Take 10 mg by mouth at bedtime. 05/06/16  Yes Historical Provider, MD  cyclobenzaprine (FLEXERIL) 5 MG tablet Take 2 tablets (10 mg total) by mouth 3 (three) times daily as needed for muscle spasms. Patient not taking: Reported on 05/25/2016 05/15/16   Valinda Party, DO  diazepam (VALIUM) 10 MG tablet Take 1 tablet (10 mg total) by mouth once as needed for anxiety. #42 filled 04/26/16 Patient not taking: Reported on 05/25/2016 05/15/16   Janett Billow Ratliff Hoffman, DO  ondansetron (ZOFRAN ODT) 4 MG disintegrating tablet Take 1 tablet (4 mg total) by mouth every 8 (eight) hours as needed for nausea or vomiting. Patient not taking: Reported on 05/25/2016 02/04/16   [facility] Ward, PA-C  oxycodone (ROXICODONE) 15 MG immediate release tablet Take 2 tablets (30 mg total) by mouth every 4 (four) hours as needed for pain. Patient not taking: Reported on 05/25/2016 05/15/16   Valinda Party, DO  pantoprazole (PROTONIX) 20 MG tablet Take 1 tablet (20 mg total) by mouth daily. Patient not taking: Reported on 05/25/2016 02/04/16   [facility] Ward, PA-C  zolpidem (AMBIEN) 5 MG tablet 5mg  by mouth at bedtime as needed for sleep Patient not taking: Reported on 05/25/2016 05/15/16   Valinda Party, DO    Family History Family History  Problem Relation Age of Onset  . Cancer Father     Colon  . Coronary artery disease    . Heart attack Mother     Social History Social History  Substance Use Topics  . Smoking  status: Current Every Day Smoker    Packs/day: 0.50    Years: 10.00    Types: Cigarettes  . Smokeless tobacco: Never Used     Comment: smokes 6-7 cigarettes per day  . Alcohol use No     Allergies   Bee venom; Doxycycline; Erythromycin; Ibuprofen; Morphine and related; Penicillins; Sulfa antibiotics; Cephalexin; Dust mite extract; Tramadol; Other; and Sulfonamide derivatives   Review of Systems Review of Systems  Respiratory: Positive for shortness of breath.   Musculoskeletal:       Left  hip pain  All other systems reviewed and are negative.    Physical Exam Updated Vital Signs BP 98/74 (BP Location: Right Arm)   Pulse 88   Temp 99.2 F (37.3 C) (Oral)   Resp 13   SpO2 94%   Physical Exam  Constitutional: She is oriented to person, place, and time. She appears well-developed and well-nourished.  HENT:  Head: Normocephalic and atraumatic.  Right Ear: External ear normal.  Left Ear: External ear normal.  Nose: Nose normal.  Mouth/Throat: Oropharynx is clear and moist.  Eyes: Conjunctivae and EOM are normal. Pupils are equal, round, and reactive to light.  Neck: Normal range of motion. Neck supple.  Cardiovascular: Regular rhythm, normal heart sounds and intact distal pulses.  Tachycardia present.   Pulmonary/Chest: Effort normal and breath sounds normal.  Abdominal: Soft. Bowel sounds are normal.  Musculoskeletal: Normal range of motion.  Neurological: She is alert and oriented to person, place, and time.  Skin: Skin is warm and dry.  Psychiatric: She has a normal mood and affect. Her behavior is normal. Judgment and thought content normal.  Nursing note and vitals reviewed.    ED Treatments / Results  Labs (all labs ordered are listed, but only abnormal results are displayed) Labs Reviewed  COMPREHENSIVE METABOLIC PANEL  CBC WITH DIFFERENTIAL/PLATELET  TROPONIN I  BRAIN NATRIURETIC PEPTIDE  D-DIMER, QUANTITATIVE (NOT AT [facility])  URINALYSIS, ROUTINE W REFLEX  MICROSCOPIC (NOT AT [facility])  CK    EKG  EKG Interpretation  Date/Time:  Tuesday May 25 2016 11:39:18 EDT Ventricular Rate:  99 PR Interval:    QRS Duration: 97 QT Interval:  368 QTC Calculation: 473 R Axis:   85 Text Interpretation:  Sinus rhythm Confirmed by Gilford Raid MD, Rajanae Mantia ([id number]) on 05/25/2016 12:05:48 PM       Radiology Dg Chest 2 View  Result Date: 05/25/2016 CLINICAL DATA:  Shortness of breath and left leg pain for 2 weeks. EXAM: CHEST  2 VIEW COMPARISON:  05/10/2016 FINDINGS: Both lungs are clear without pulmonary edema or focal airspace disease. Heart and mediastinum are within normal limits. The trachea is midline. Again noted is a surgical plate in the lower cervical spine. No acute bone abnormality. No large pleural effusions. IMPRESSION: No active cardiopulmonary disease. Electronically Signed   By: Markus Daft M.D.   On: 05/25/2016 12:07   Ct Angio Chest Pe W And/or Wo Contrast  Result Date: 05/25/2016 CLINICAL DATA:  Shortness of breath, and low oxygen saturation. EXAM: CT ANGIOGRAPHY CHEST WITH CONTRAST TECHNIQUE: Multidetector CT imaging of the chest was performed using the standard protocol during bolus administration of intravenous contrast. Multiplanar CT image reconstructions and MIPs were obtained to evaluate the vascular anatomy. CONTRAST:  Isovue 370, 100 mL. COMPARISON:  None. FINDINGS: Cardiovascular: Satisfactory opacification of the pulmonary arteries to the segmental level. No evidence of pulmonary embolism. Normal heart size. No pericardial effusion. Mediastinum/Nodes: No enlarged mediastinal, hilar, or axillary lymph nodes. Thyroid gland, trachea, and esophagus demonstrate no significant findings. Lungs/Pleura: Lungs are clear with asymmetric dependent atelectasis on the RIGHT, but no consolidation. No pleural effusion or pneumothorax. Upper Abdomen: No acute abnormality. Musculoskeletal: No chest wall abnormality. No acute or significant osseous findings.  Review of the MIP images confirms the above findings. IMPRESSION: No acute findings. Specifically no evidence for acute pulmonary emboli. Electronically Signed   By: Staci Righter M.D.   On: 05/25/2016 14:33    Procedures Procedures (including critical care time)  Medications Ordered in ED Medications  iopamidol (ISOVUE-370) 76 % injection 100 mL (not administered)  ondansetron (ZOFRAN) injection 4 mg (4 mg Intravenous Given 05/25/16 1233)  fentaNYL (SUBLIMAZE) injection 100 mcg (100 mcg Intravenous Given 05/25/16 1233)  iopamidol (ISOVUE-370) 76 % injection 100 mL (100 mLs Intravenous Contrast Given 05/25/16 1405)  oxyCODONE (Oxy IR/ROXICODONE) immediate release tablet 15 mg (15 mg Oral Given 05/25/16 1439)     Initial Impression / Assessment and Plan / ED Course  I have reviewed the triage vital signs and the nursing notes.  Pertinent labs & imaging results that were available during my care of the patient were reviewed by me and considered in my medical decision making (see chart for details).  Clinical Course    Pt is feeling better.  She does not have a PE.  She is oxygenating better and was able to ambulate with a walker with her O2 sats on RA not dropping below 96%.  I suspect her low O2 sat was from her narcotic pain medicine usage.   Her kidney function and ck are good.  She knows to return if worse.  Final Clinical Impressions(s) / ED Diagnoses   Final diagnoses:  Dyspnea  Dehydration    New Prescriptions New Prescriptions   No medications on file     Isla Pence, MD 05/25/16 1516

## 2016-05-25 NOTE — ED Notes (Signed)
CT notified pts labs have resulted. CT en route.

## 2016-05-25 NOTE — ED Triage Notes (Addendum)
BIB EMS from Home c/o SOB, hip pain, low back pain, neck pain. Pt denies recent injuries. EMS states pt SPO2 on RA 88-90%, no distress observed. Pt arrives A+OX4, in no acute distress.   EMS Vitals  BP 110/74 HR 103 SPO2 93% on 2L 97.4 temp

## 2016-05-25 NOTE — ED Notes (Signed)
Patient transported to X-ray 

## 2016-05-27 DIAGNOSIS — E271 Primary adrenocortical insufficiency: Secondary | ICD-10-CM | POA: Diagnosis not present

## 2016-05-27 DIAGNOSIS — I110 Hypertensive heart disease with heart failure: Secondary | ICD-10-CM | POA: Diagnosis not present

## 2016-05-27 DIAGNOSIS — M609 Myositis, unspecified: Secondary | ICD-10-CM | POA: Diagnosis not present

## 2016-05-27 DIAGNOSIS — I2510 Atherosclerotic heart disease of native coronary artery without angina pectoris: Secondary | ICD-10-CM | POA: Diagnosis not present

## 2016-05-27 DIAGNOSIS — K219 Gastro-esophageal reflux disease without esophagitis: Secondary | ICD-10-CM | POA: Diagnosis not present

## 2016-05-27 DIAGNOSIS — I509 Heart failure, unspecified: Secondary | ICD-10-CM | POA: Diagnosis not present

## 2016-05-28 DIAGNOSIS — K219 Gastro-esophageal reflux disease without esophagitis: Secondary | ICD-10-CM | POA: Diagnosis not present

## 2016-05-28 DIAGNOSIS — I2510 Atherosclerotic heart disease of native coronary artery without angina pectoris: Secondary | ICD-10-CM | POA: Diagnosis not present

## 2016-05-28 DIAGNOSIS — E271 Primary adrenocortical insufficiency: Secondary | ICD-10-CM | POA: Diagnosis not present

## 2016-05-28 DIAGNOSIS — I509 Heart failure, unspecified: Secondary | ICD-10-CM | POA: Diagnosis not present

## 2016-05-28 DIAGNOSIS — I110 Hypertensive heart disease with heart failure: Secondary | ICD-10-CM | POA: Diagnosis not present

## 2016-05-28 DIAGNOSIS — M609 Myositis, unspecified: Secondary | ICD-10-CM | POA: Diagnosis not present

## 2016-05-30 IMAGING — CT CT ABD-PELV W/ CM
2 of 5 series · 10 of 46 positions shown, 11 images · IV contrast (omnipaque)
Comparison: CT scan 05/11/2014

CLINICAL DATA: Upper abdominal pain, nausea and vomiting starting
yesterday

EXAM:
CT ABDOMEN AND PELVIS WITH CONTRAST
TECHNIQUE: Multidetector CT imaging of the abdomen and pelvis was performed
using the standard protocol following bolus administration of
intravenous contrast.
CONTRAST:  100mL OMNIPAQUE IOHEXOL 300 MG/ML  SOLN

[Series 201: routine, idose (2) · axial · 0.78mm/px · z∈[-768,-383]mm · 7 of 99 slices shown, 8 images]
[im 11/99  soft-tissue]
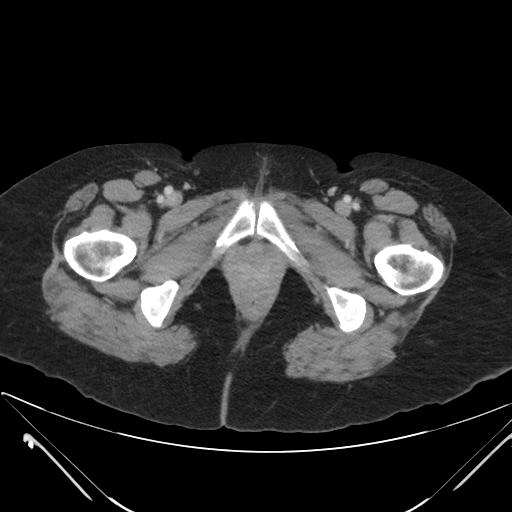
[im 11/99  bone]
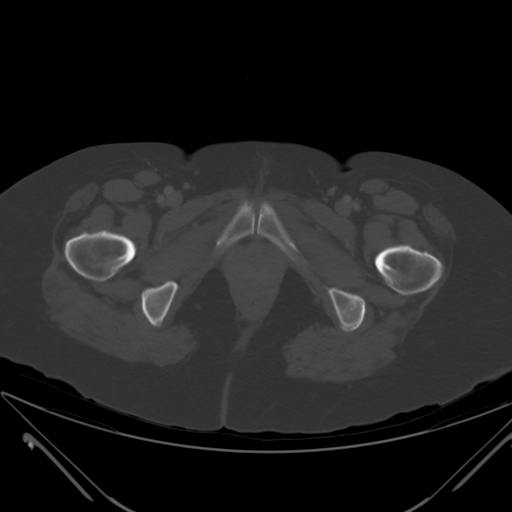
[im 22/99  soft-tissue]
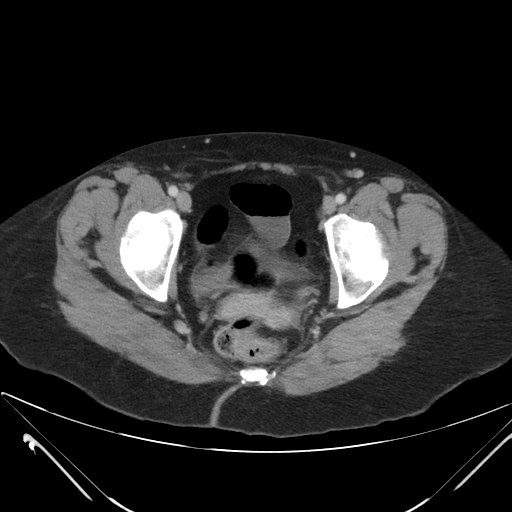
[im 39/99  soft-tissue]
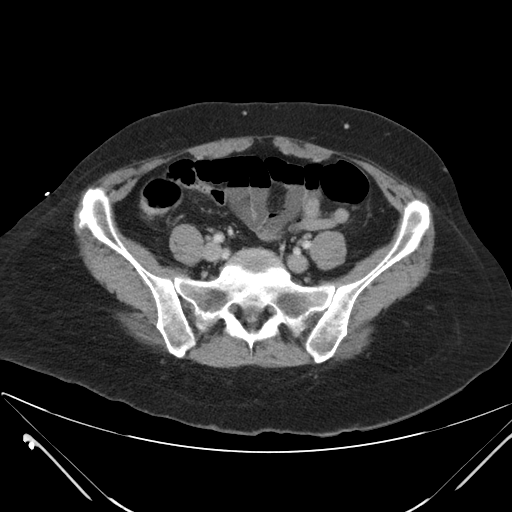
[im 50/99  soft-tissue]
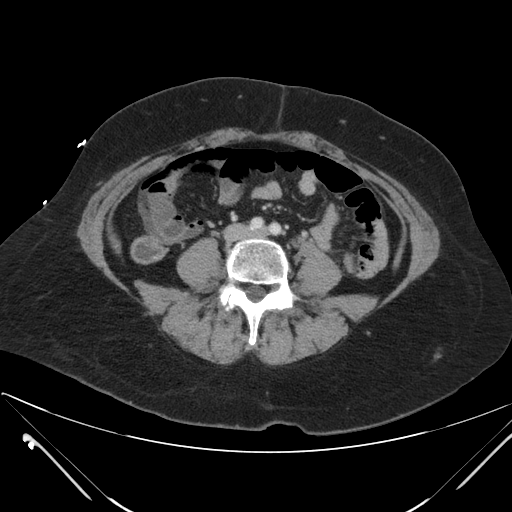
[im 60/99  soft-tissue]
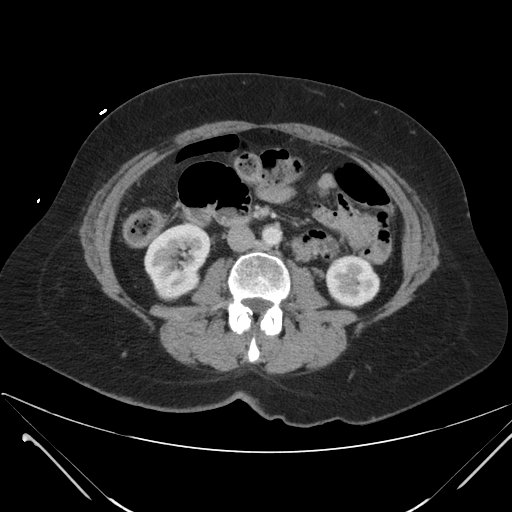
[im 77/99  soft-tissue]
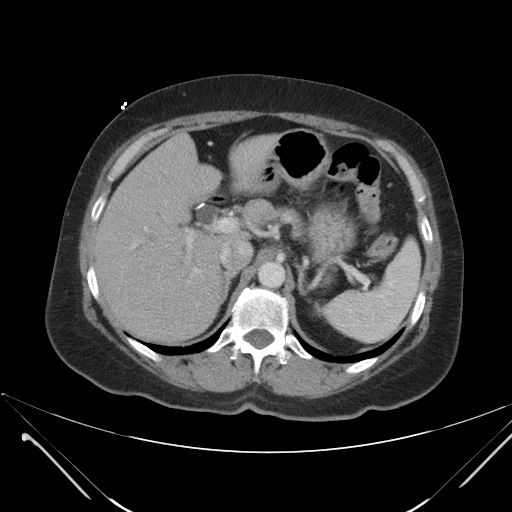
[im 88/99  soft-tissue]
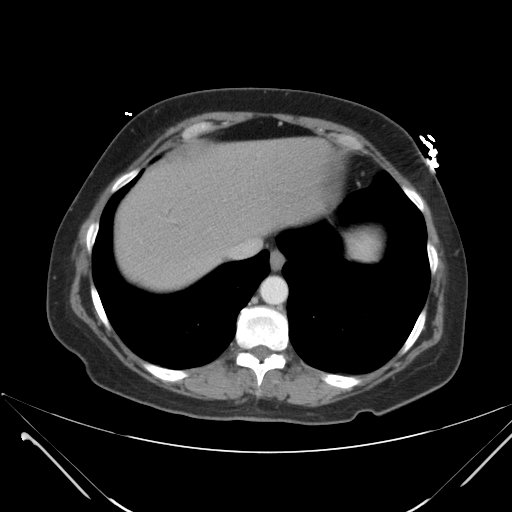

[Series 203: coronals, idose (2) · coronal · 0.45mm/px · 3 of 157 slices shown]
[im 53/157  soft-tissue]
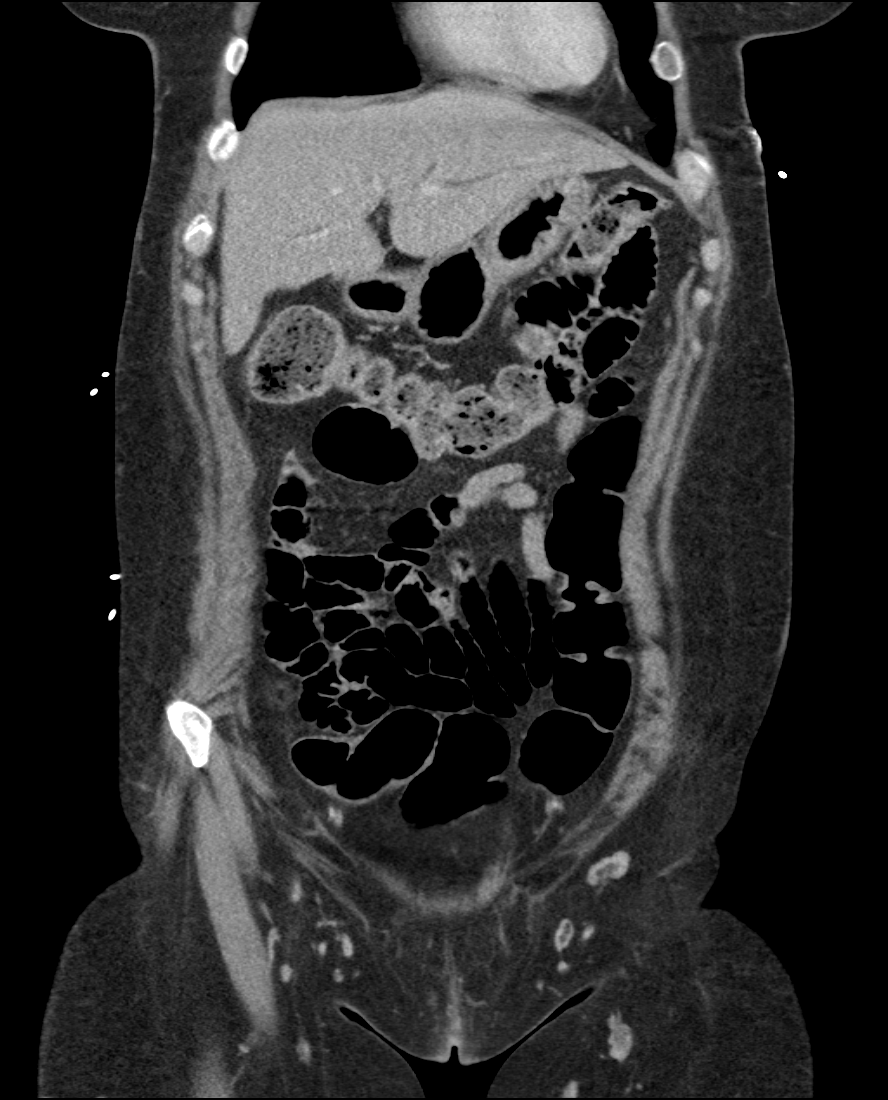
[im 70/157  soft-tissue]
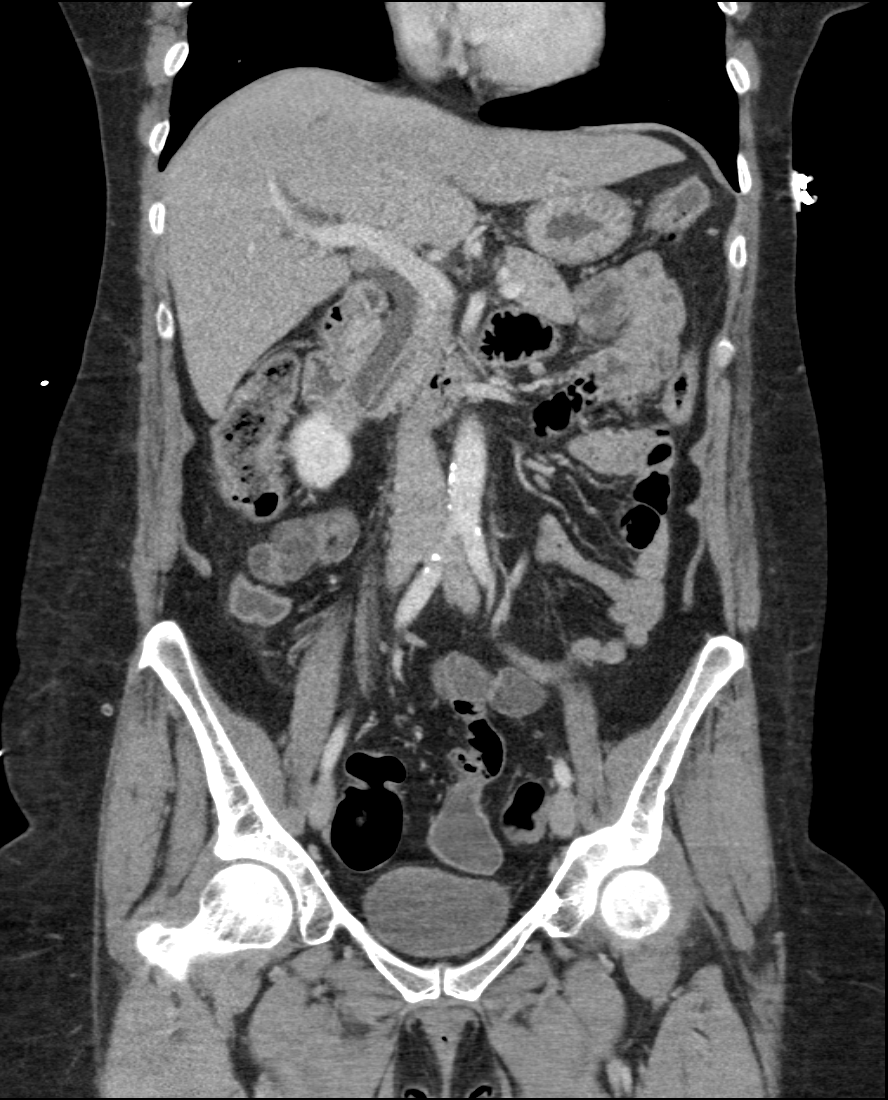
[im 87/157  soft-tissue]
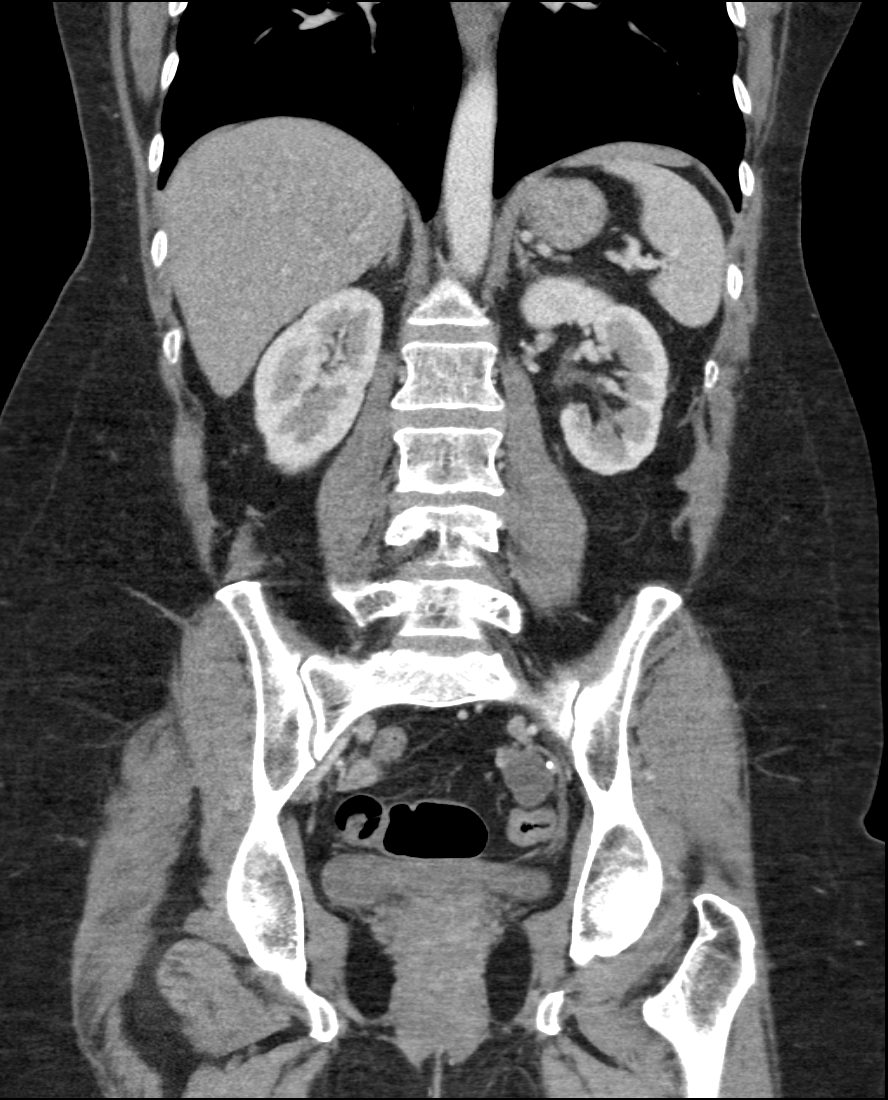

[10 of 46 positions shown; findings below may reference images not displayed]

FINDINGS: Lung bases are unremarkable. Sagittal images of the spine shows mild
disc space flattening with mild anterior spurring at L3-L4 level.
The patient is status postcholecystectomy. Mild intrahepatic biliary
ductal dilatation. Mild fatty infiltration of the liver. CBD
measures 8.7 mm in diameter probable postcholecystectomy. No focal
hepatic mass. The pancreas, spleen and adrenal glands are
unremarkable. Mild atherosclerotic calcifications bilateral common
iliac artery. Mild atherosclerotic calcification at left renal
artery origin.

Kidneys are symmetrical in size and enhancement. No hydronephrosis
or hydroureter.

There is no small bowel obstruction. No ascites or free air. No
adenopathy. Again noted a low lying cecum with tip in mid anterior
pelvis just above the urinary bladder. Some liquid stool and gas
noted within cecum. No pericecal inflammation. Normal appendix is
noted in axial image 68 containing gas.

Some colonic stool noted in transverse colon. Mild redundant sigmoid
colon. Moderate gas noted in mid sigmoid colon. The uterus is
surgically absent. There is a left ovarian cyst measures 2.2 cm. The
urinary bladder is unremarkable.

Terminal ileum is unremarkable.

Delayed renal images shows bilateral renal symmetrical excretion.
Bilateral visualized proximal ureter is unremarkable.
IMPRESSION: 1. Mild fatty infiltration of the liver. No focal hepatic mass. Mild
intrahepatic and extrahepatic biliary ductal dilatation probable
postcholecystectomy.
2. No hydronephrosis or hydroureter.
3. Mild atherosclerotic vascular calcifications.
4. Again noted low lying cecum. No pericecal inflammation. Normal
appendix.
5. Surgically absent uterus. There is a left ovarian cyst measures
2.2 cm.
6. Mild redundant sigmoid colon. No colonic obstruction. Moderate
gas noted mid sigmoid colon.

## 2016-06-01 DIAGNOSIS — I110 Hypertensive heart disease with heart failure: Secondary | ICD-10-CM | POA: Diagnosis not present

## 2016-06-01 DIAGNOSIS — E271 Primary adrenocortical insufficiency: Secondary | ICD-10-CM | POA: Diagnosis not present

## 2016-06-01 DIAGNOSIS — K219 Gastro-esophageal reflux disease without esophagitis: Secondary | ICD-10-CM | POA: Diagnosis not present

## 2016-06-01 DIAGNOSIS — I2510 Atherosclerotic heart disease of native coronary artery without angina pectoris: Secondary | ICD-10-CM | POA: Diagnosis not present

## 2016-06-01 DIAGNOSIS — I509 Heart failure, unspecified: Secondary | ICD-10-CM | POA: Diagnosis not present

## 2016-06-01 DIAGNOSIS — M609 Myositis, unspecified: Secondary | ICD-10-CM | POA: Diagnosis not present

## 2016-06-04 DIAGNOSIS — I110 Hypertensive heart disease with heart failure: Secondary | ICD-10-CM | POA: Diagnosis not present

## 2016-06-04 DIAGNOSIS — M609 Myositis, unspecified: Secondary | ICD-10-CM | POA: Diagnosis not present

## 2016-06-04 DIAGNOSIS — I509 Heart failure, unspecified: Secondary | ICD-10-CM | POA: Diagnosis not present

## 2016-06-04 DIAGNOSIS — I2510 Atherosclerotic heart disease of native coronary artery without angina pectoris: Secondary | ICD-10-CM | POA: Diagnosis not present

## 2016-06-04 DIAGNOSIS — E271 Primary adrenocortical insufficiency: Secondary | ICD-10-CM | POA: Diagnosis not present

## 2016-06-04 DIAGNOSIS — K219 Gastro-esophageal reflux disease without esophagitis: Secondary | ICD-10-CM | POA: Diagnosis not present

## 2016-06-07 DIAGNOSIS — I2510 Atherosclerotic heart disease of native coronary artery without angina pectoris: Secondary | ICD-10-CM | POA: Diagnosis not present

## 2016-06-07 DIAGNOSIS — I509 Heart failure, unspecified: Secondary | ICD-10-CM | POA: Diagnosis not present

## 2016-06-07 DIAGNOSIS — I110 Hypertensive heart disease with heart failure: Secondary | ICD-10-CM | POA: Diagnosis not present

## 2016-06-07 DIAGNOSIS — E271 Primary adrenocortical insufficiency: Secondary | ICD-10-CM | POA: Diagnosis not present

## 2016-06-07 DIAGNOSIS — K219 Gastro-esophageal reflux disease without esophagitis: Secondary | ICD-10-CM | POA: Diagnosis not present

## 2016-06-07 DIAGNOSIS — M609 Myositis, unspecified: Secondary | ICD-10-CM | POA: Diagnosis not present

## 2016-06-10 DIAGNOSIS — K219 Gastro-esophageal reflux disease without esophagitis: Secondary | ICD-10-CM | POA: Diagnosis not present

## 2016-06-10 DIAGNOSIS — I509 Heart failure, unspecified: Secondary | ICD-10-CM | POA: Diagnosis not present

## 2016-06-10 DIAGNOSIS — M609 Myositis, unspecified: Secondary | ICD-10-CM | POA: Diagnosis not present

## 2016-06-10 DIAGNOSIS — I2510 Atherosclerotic heart disease of native coronary artery without angina pectoris: Secondary | ICD-10-CM | POA: Diagnosis not present

## 2016-06-10 DIAGNOSIS — I110 Hypertensive heart disease with heart failure: Secondary | ICD-10-CM | POA: Diagnosis not present

## 2016-06-10 DIAGNOSIS — E271 Primary adrenocortical insufficiency: Secondary | ICD-10-CM | POA: Diagnosis not present

## 2016-06-14 ENCOUNTER — Emergency Department (HOSPITAL_COMMUNITY)
Admission: EM | Admit: 2016-06-14 | Discharge: 2016-06-14 | Disposition: A | Discharge status: Home / Self Care | Payer: Medicare Other | Attending: Emergency Medicine | Admitting: Emergency Medicine

## 2016-06-14 ENCOUNTER — Emergency Department (HOSPITAL_COMMUNITY): Payer: Medicare Other

## 2016-06-14 ENCOUNTER — Encounter (HOSPITAL_COMMUNITY): Payer: Self-pay | Admitting: Emergency Medicine

## 2016-06-14 DIAGNOSIS — I509 Heart failure, unspecified: Secondary | ICD-10-CM | POA: Diagnosis not present

## 2016-06-14 DIAGNOSIS — E039 Hypothyroidism, unspecified: Secondary | ICD-10-CM | POA: Insufficient documentation

## 2016-06-14 DIAGNOSIS — M545 Low back pain: Secondary | ICD-10-CM | POA: Diagnosis not present

## 2016-06-14 DIAGNOSIS — Z79899 Other long term (current) drug therapy: Secondary | ICD-10-CM | POA: Diagnosis not present

## 2016-06-14 DIAGNOSIS — M25552 Pain in left hip: Secondary | ICD-10-CM

## 2016-06-14 DIAGNOSIS — Z7982 Long term (current) use of aspirin: Secondary | ICD-10-CM | POA: Insufficient documentation

## 2016-06-14 DIAGNOSIS — I110 Hypertensive heart disease with heart failure: Secondary | ICD-10-CM | POA: Insufficient documentation

## 2016-06-14 DIAGNOSIS — R1030 Lower abdominal pain, unspecified: Secondary | ICD-10-CM | POA: Diagnosis not present

## 2016-06-14 DIAGNOSIS — I2510 Atherosclerotic heart disease of native coronary artery without angina pectoris: Secondary | ICD-10-CM | POA: Diagnosis not present

## 2016-06-14 DIAGNOSIS — F17210 Nicotine dependence, cigarettes, uncomplicated: Secondary | ICD-10-CM | POA: Diagnosis not present

## 2016-06-14 DIAGNOSIS — M533 Sacrococcygeal disorders, not elsewhere classified: Secondary | ICD-10-CM | POA: Diagnosis not present

## 2016-06-14 LAB — BASIC METABOLIC PANEL
Anion gap: 5 (ref 5–15)
BUN: 15 mg/dL (ref 6–20)
CHLORIDE: 104 mmol/L (ref 101–111)
CO2: 28 mmol/L (ref 22–32)
CREATININE: 0.88 mg/dL (ref 0.44–1.00)
Calcium: 8.9 mg/dL (ref 8.9–10.3)
GFR calc non Af Amer: >60 mL/min (ref >60)
Glucose, Bld: 95 mg/dL (ref 65–99)
POTASSIUM: 4.1 mmol/L (ref 3.5–5.1)
Sodium: 137 mmol/L (ref 135–145)

## 2016-06-14 LAB — URINALYSIS, ROUTINE W REFLEX MICROSCOPIC
Bilirubin Urine: NEGATIVE
GLUCOSE, UA: NEGATIVE mg/dL
Hgb urine dipstick: NEGATIVE
Ketones, ur: NEGATIVE mg/dL
Nitrite: NEGATIVE
PH: 8 (ref 5.0–8.0)
Protein, ur: NEGATIVE mg/dL
Specific Gravity, Urine: 1.008 (ref 1.005–1.030)

## 2016-06-14 LAB — URINE MICROSCOPIC-ADD ON
BACTERIA UA: NONE SEEN
RBC / HPF: NONE SEEN RBC/hpf (ref 0–5)

## 2016-06-14 LAB — CBC WITH DIFFERENTIAL/PLATELET
BASOS PCT: 0 %
Basophils Absolute: 0 10*3/uL (ref 0.0–0.1)
EOS PCT: 1 %
Eosinophils Absolute: 0.1 10*3/uL (ref 0.0–0.7)
HEMATOCRIT: 41.3 % (ref 36.0–46.0)
HEMOGLOBIN: 13.5 g/dL (ref 12.0–15.0)
LYMPHS ABS: 3.3 10*3/uL (ref 0.7–4.0)
LYMPHS PCT: 24 %
MCH: 28.1 pg (ref 26.0–34.0)
MCHC: 32.7 g/dL (ref 30.0–36.0)
MCV: 85.9 fL (ref 78.0–100.0)
MONOS PCT: 3 %
Monocytes Absolute: 0.4 10*3/uL (ref 0.1–1.0)
NEUTROS ABS: 10.1 10*3/uL — AB (ref 1.7–7.7)
Neutrophils Relative %: 72 %
Platelets: UNDETERMINED 10*3/uL (ref 150–400)
RBC: 4.81 MIL/uL (ref 3.87–5.11)
RDW: 14.8 % (ref 11.5–15.5)
WBC: 13.9 10*3/uL — ABNORMAL HIGH (ref 4.0–10.5)

## 2016-06-14 LAB — CK: CK TOTAL: 44 U/L (ref 38–234)

## 2016-06-14 MED ORDER — HYDROMORPHONE HCL 1 MG/ML IJ SOLN
1.0000 mg | Freq: Once | INTRAMUSCULAR | Status: DC
  Filled 2016-06-14: qty 1

## 2016-06-14 MED ORDER — DIAZEPAM 5 MG PO TABS: 5.0000 mg | ORAL_TABLET | Freq: Once | ORAL | Status: DC

## 2016-06-14 MED ORDER — HYDROMORPHONE HCL 1 MG/ML IJ SOLN
0.5000 mg | Freq: Once | INTRAMUSCULAR | Status: AC
  Administered 2016-06-14: 0.5 mg via INTRAVENOUS
  Filled 2016-06-14: qty 1

## 2016-06-14 MED ORDER — HYDROMORPHONE HCL 1 MG/ML IJ SOLN
1.0000 mg | Freq: Once | INTRAMUSCULAR | Status: AC
  Administered 2016-06-14: 1 mg via INTRAVENOUS

## 2016-06-14 NOTE — Discharge Instructions (Signed)
Please follow up with your doctor for recheck. Return if worsening symptoms.

## 2016-06-14 NOTE — ED Triage Notes (Signed)
Pt is from home.  She had an episode of weakness last month causing her to spend 12 hours on the toilet which led to [facility] and sepsis?  Presents today c/o left hip and coccyx pain 10/10.  No fall, just worsening over the last few days.

## 2016-06-14 NOTE — ED Notes (Signed)
2 IV start attempts by this RN.  Charge RN notified of need for ultrasound IV placement.  Ultrasound machine bedside.

## 2016-06-14 NOTE — ED Notes (Signed)
Pt given dental clinic info sheet by RN

## 2016-06-14 NOTE — ED Notes (Signed)
Patient ambulated successfully in the hallway with no difficulty or deficits with walker.
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
## 2016-06-14 NOTE — ED Notes (Signed)
Patient transported to X-ray 

## 2016-06-16 DIAGNOSIS — I2510 Atherosclerotic heart disease of native coronary artery without angina pectoris: Secondary | ICD-10-CM | POA: Diagnosis not present

## 2016-06-16 DIAGNOSIS — K219 Gastro-esophageal reflux disease without esophagitis: Secondary | ICD-10-CM | POA: Diagnosis not present

## 2016-06-16 DIAGNOSIS — M609 Myositis, unspecified: Secondary | ICD-10-CM | POA: Diagnosis not present

## 2016-06-16 DIAGNOSIS — I110 Hypertensive heart disease with heart failure: Secondary | ICD-10-CM | POA: Diagnosis not present

## 2016-06-16 DIAGNOSIS — I509 Heart failure, unspecified: Secondary | ICD-10-CM | POA: Diagnosis not present

## 2016-06-16 DIAGNOSIS — E271 Primary adrenocortical insufficiency: Secondary | ICD-10-CM | POA: Diagnosis not present

## 2016-06-17 DIAGNOSIS — I110 Hypertensive heart disease with heart failure: Secondary | ICD-10-CM | POA: Diagnosis not present

## 2016-06-17 DIAGNOSIS — I509 Heart failure, unspecified: Secondary | ICD-10-CM | POA: Diagnosis not present

## 2016-06-17 DIAGNOSIS — M609 Myositis, unspecified: Secondary | ICD-10-CM | POA: Diagnosis not present

## 2016-06-17 DIAGNOSIS — K219 Gastro-esophageal reflux disease without esophagitis: Secondary | ICD-10-CM | POA: Diagnosis not present

## 2016-06-17 DIAGNOSIS — I2510 Atherosclerotic heart disease of native coronary artery without angina pectoris: Secondary | ICD-10-CM | POA: Diagnosis not present

## 2016-06-17 DIAGNOSIS — E271 Primary adrenocortical insufficiency: Secondary | ICD-10-CM | POA: Diagnosis not present

## 2016-06-21 DIAGNOSIS — I2510 Atherosclerotic heart disease of native coronary artery without angina pectoris: Secondary | ICD-10-CM | POA: Diagnosis not present

## 2016-06-21 DIAGNOSIS — Z6828 Body mass index (BMI) 28.0-28.9, adult: Secondary | ICD-10-CM | POA: Diagnosis not present

## 2016-06-21 DIAGNOSIS — F419 Anxiety disorder, unspecified: Secondary | ICD-10-CM | POA: Diagnosis not present

## 2016-06-21 DIAGNOSIS — M1991 Primary osteoarthritis, unspecified site: Secondary | ICD-10-CM | POA: Diagnosis not present

## 2016-06-21 DIAGNOSIS — E663 Overweight: Secondary | ICD-10-CM | POA: Diagnosis not present

## 2016-06-21 DIAGNOSIS — J45909 Unspecified asthma, uncomplicated: Secondary | ICD-10-CM | POA: Diagnosis not present

## 2016-06-21 DIAGNOSIS — K219 Gastro-esophageal reflux disease without esophagitis: Secondary | ICD-10-CM | POA: Diagnosis not present

## 2016-06-21 DIAGNOSIS — E271 Primary adrenocortical insufficiency: Secondary | ICD-10-CM | POA: Diagnosis not present

## 2016-06-21 DIAGNOSIS — I110 Hypertensive heart disease with heart failure: Secondary | ICD-10-CM | POA: Diagnosis not present

## 2016-06-21 DIAGNOSIS — M609 Myositis, unspecified: Secondary | ICD-10-CM | POA: Diagnosis not present

## 2016-06-21 DIAGNOSIS — E063 Autoimmune thyroiditis: Secondary | ICD-10-CM | POA: Diagnosis not present

## 2016-06-21 DIAGNOSIS — G894 Chronic pain syndrome: Secondary | ICD-10-CM | POA: Diagnosis not present

## 2016-06-21 DIAGNOSIS — I509 Heart failure, unspecified: Secondary | ICD-10-CM | POA: Diagnosis not present

## 2016-06-21 DIAGNOSIS — R42 Dizziness and giddiness: Secondary | ICD-10-CM | POA: Diagnosis not present

## 2016-06-23 DIAGNOSIS — I110 Hypertensive heart disease with heart failure: Secondary | ICD-10-CM | POA: Diagnosis not present

## 2016-06-23 DIAGNOSIS — I509 Heart failure, unspecified: Secondary | ICD-10-CM | POA: Diagnosis not present

## 2016-06-23 DIAGNOSIS — M609 Myositis, unspecified: Secondary | ICD-10-CM | POA: Diagnosis not present

## 2016-06-23 DIAGNOSIS — I2510 Atherosclerotic heart disease of native coronary artery without angina pectoris: Secondary | ICD-10-CM | POA: Diagnosis not present

## 2016-06-23 DIAGNOSIS — E271 Primary adrenocortical insufficiency: Secondary | ICD-10-CM | POA: Diagnosis not present

## 2016-06-23 DIAGNOSIS — K219 Gastro-esophageal reflux disease without esophagitis: Secondary | ICD-10-CM | POA: Diagnosis not present

## 2016-06-28 IMAGING — US US EXTREM UP DUPLEX ARTERIAL*R* LIMITED
1 series · 11 of 11 positions shown · non-contrast
Comparison: None.

CLINICAL DATA: Right arm pain following right radial cardiac
catheterization

EXAM:
Right UPPER EXTREMITY ARTERIAL DUPLEX SCAN
TECHNIQUE: Gray-scale sonography as well as color Doppler and duplex ultrasound
was performed to evaluate the arteries of the upper extremity.

[Series 1: us extrem up duplex arterial*right* limited · 0.04mm/px · 11 of 11 slices shown]
[im 1/11]
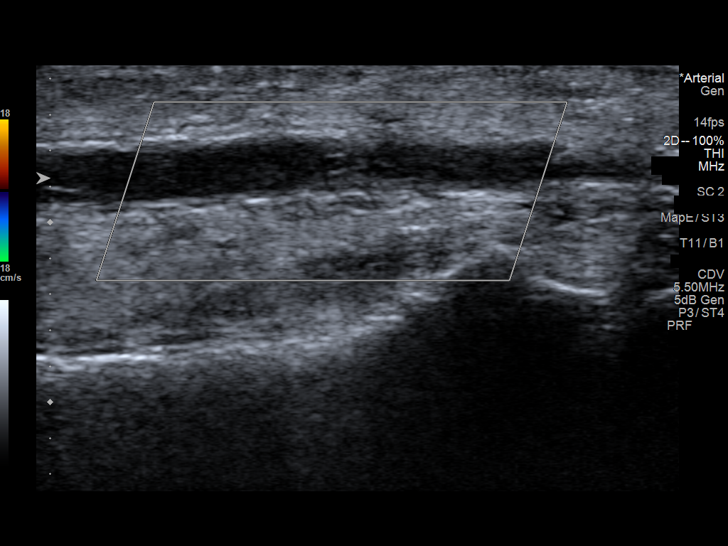
[im 2/11]
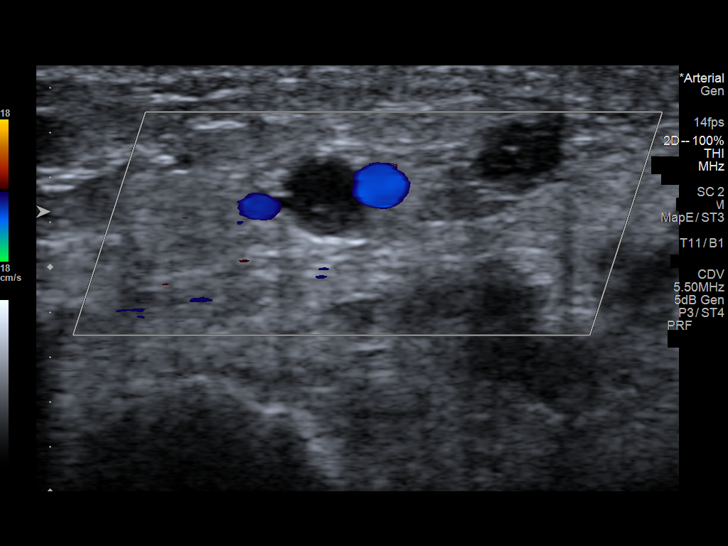
[im 3/11]
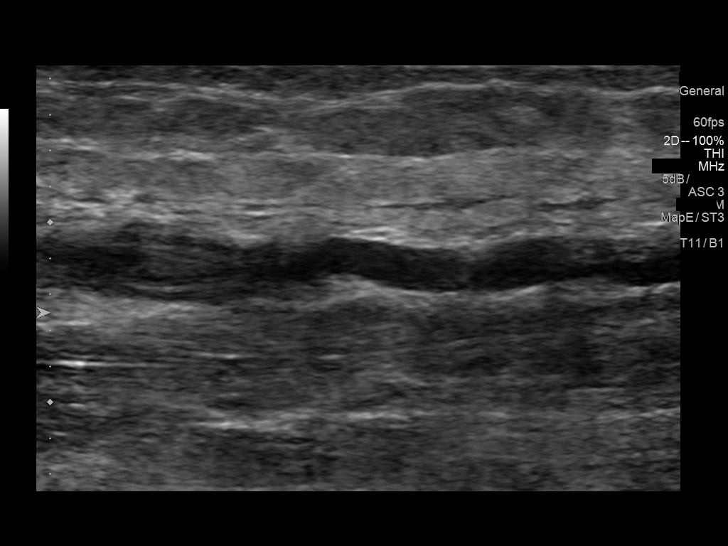
[im 4/11]
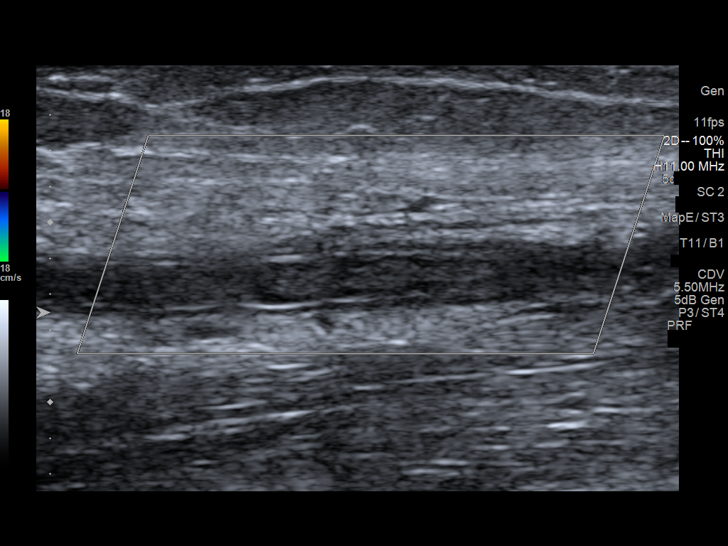
[im 5/11]
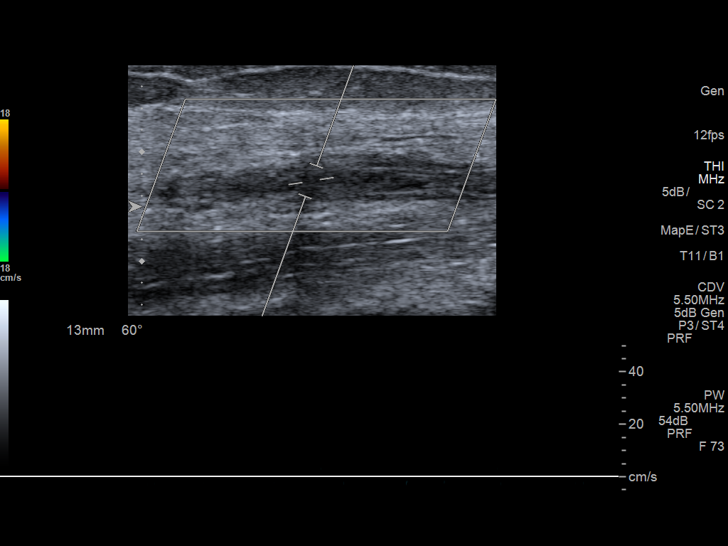
[im 6/11]
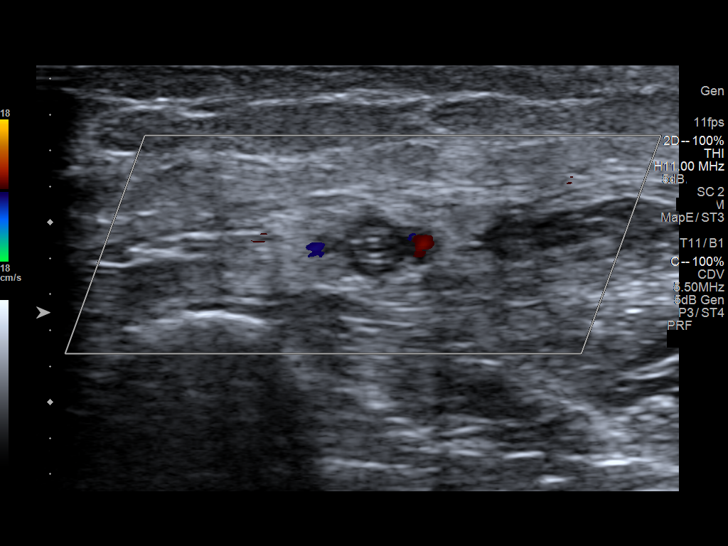
[im 7/11]
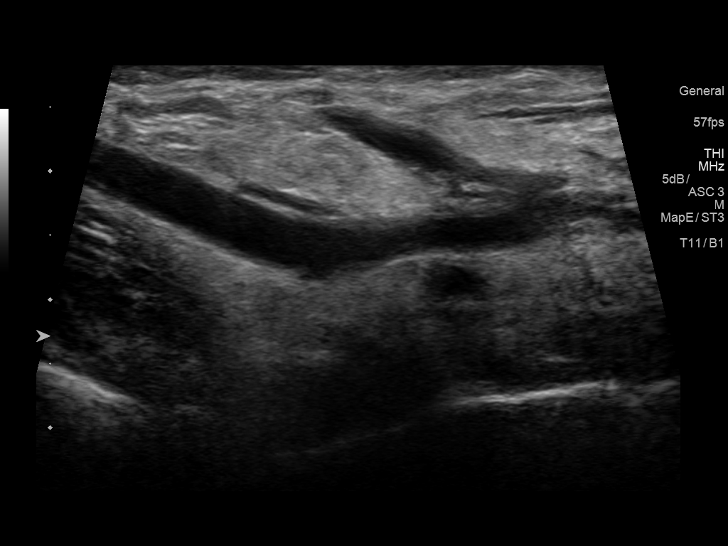
[im 8/11]
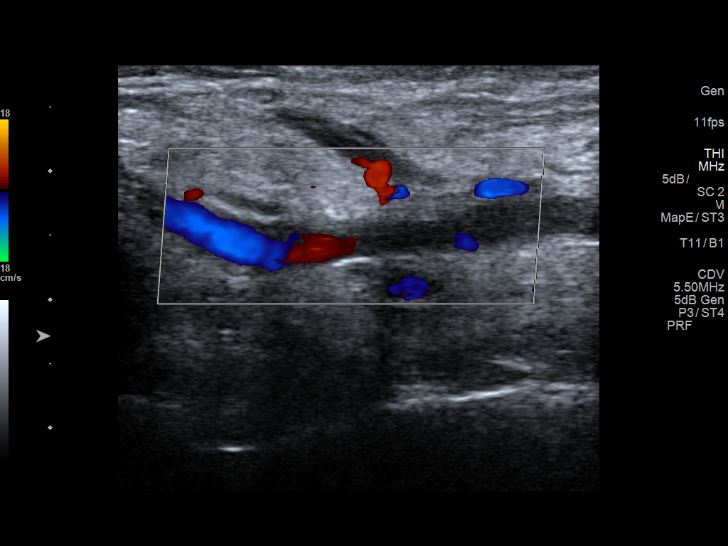
[im 9/11]
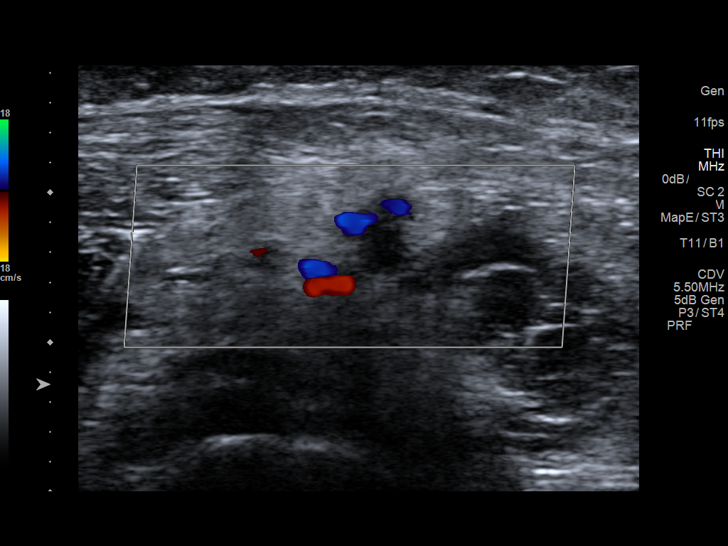
[im 10/11]
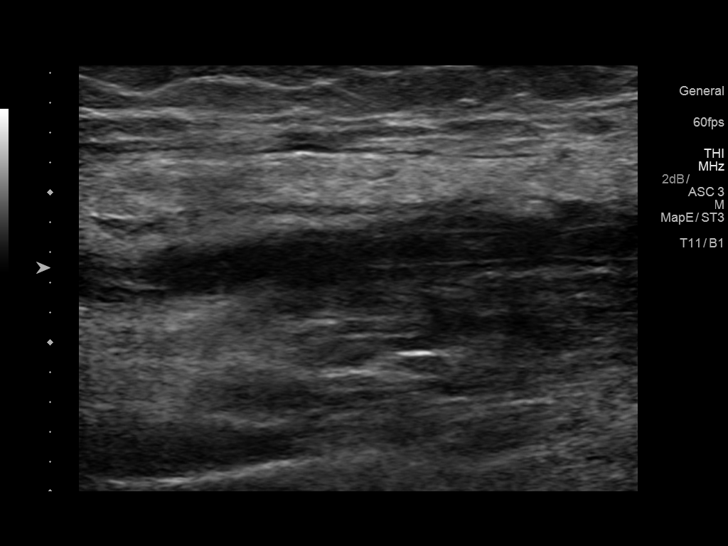
[im 11/11]
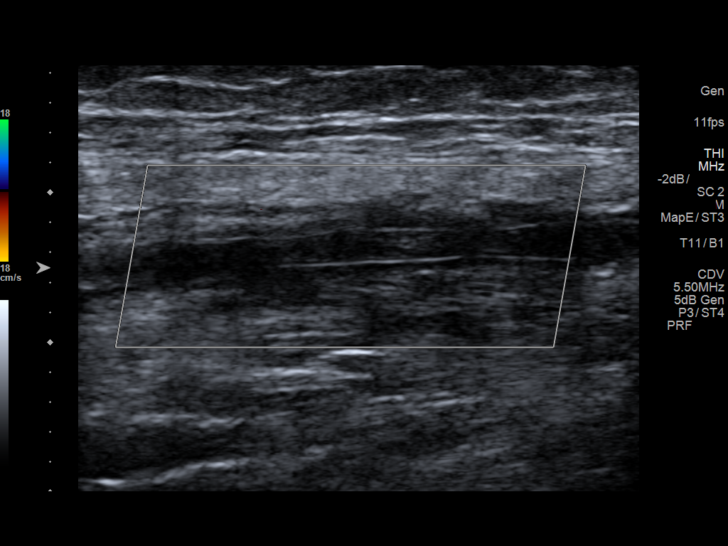

[11 of 11 positions shown; findings below may reference images not displayed]

FINDINGS: There is thrombosis of the radial artery from its origin extending
to the level of the right wrist. The adjacent venous structures are
within normal limits. No focal hematoma is noted.
IMPRESSION: Thrombosis of the radial artery throughout its course in the right
forearm.

These results will be called to the ordering clinician or
representative by the Radiologist Assistant, and communication
documented in the PACS or zVision Dashboard.

## 2016-07-22 IMAGING — DX DG CHEST 2V
2 series · 2 of 2 positions shown · non-contrast
Comparison: PA and lateral chest 05/25/2015 and 11/29/2013.

CLINICAL DATA: Chest pain beginning 2 p.m. today. No known injury.
Initial encounter.

EXAM:
CHEST  2 VIEW

[chest pa]
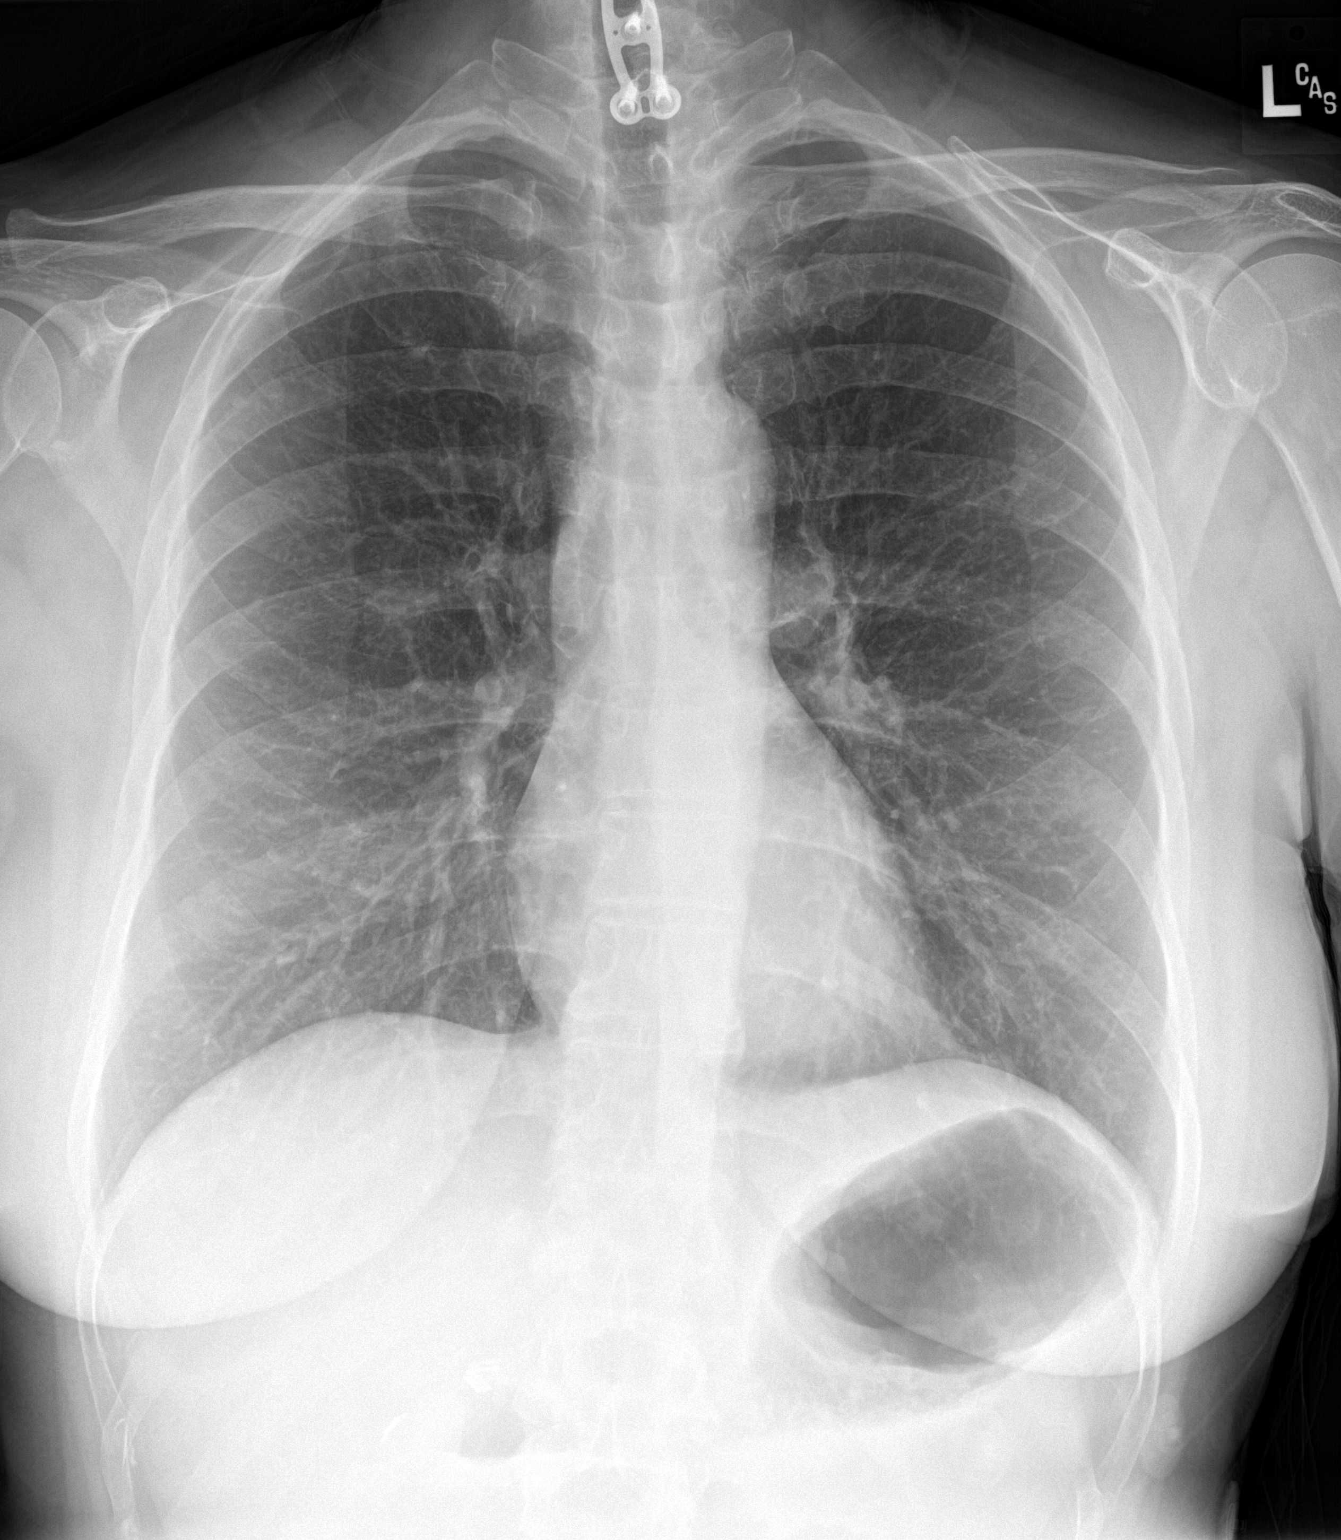

[chest lat]
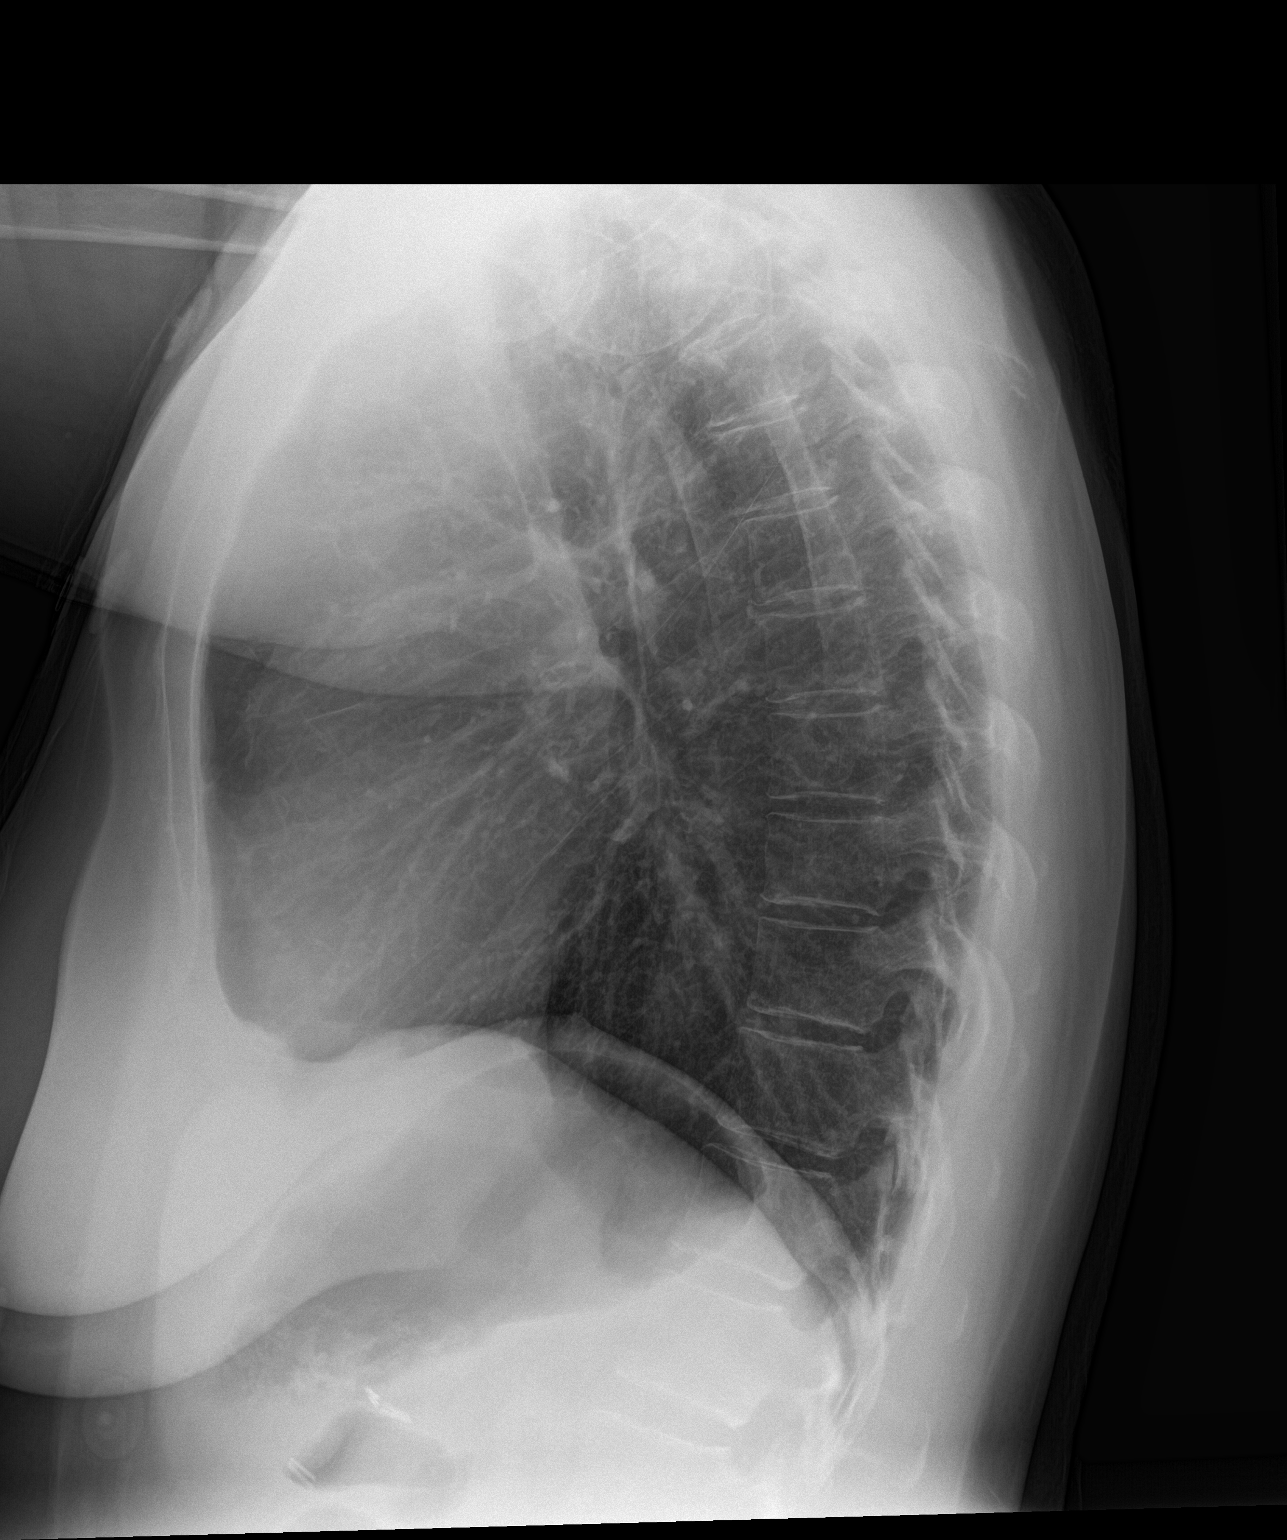

[2 of 2 positions shown; findings below may reference images not displayed]

FINDINGS: The lungs are clear. Heart size is normal. There is no pneumothorax
or pleural effusion. No focal bony abnormality is identified. The
patient is status post cervical fusion and cholecystectomy.
IMPRESSION: No acute disease.

## 2016-07-27 ENCOUNTER — Emergency Department (HOSPITAL_COMMUNITY)
Admission: EM | Admit: 2016-07-27 | Discharge: 2016-07-27 | Disposition: A | Discharge status: Home / Self Care | Payer: Medicare Other | Attending: Emergency Medicine | Admitting: Emergency Medicine

## 2016-07-27 ENCOUNTER — Emergency Department (HOSPITAL_COMMUNITY): Payer: Medicare Other

## 2016-07-27 ENCOUNTER — Encounter (HOSPITAL_COMMUNITY): Payer: Self-pay

## 2016-07-27 DIAGNOSIS — W010XXA Fall on same level from slipping, tripping and stumbling without subsequent striking against object, initial encounter: Secondary | ICD-10-CM | POA: Insufficient documentation

## 2016-07-27 DIAGNOSIS — S52501A Unspecified fracture of the lower end of right radius, initial encounter for closed fracture: Secondary | ICD-10-CM

## 2016-07-27 DIAGNOSIS — I110 Hypertensive heart disease with heart failure: Secondary | ICD-10-CM | POA: Insufficient documentation

## 2016-07-27 DIAGNOSIS — S52591A Other fractures of lower end of right radius, initial encounter for closed fracture: Secondary | ICD-10-CM | POA: Diagnosis not present

## 2016-07-27 DIAGNOSIS — S6991XA Unspecified injury of right wrist, hand and finger(s), initial encounter: Secondary | ICD-10-CM | POA: Diagnosis present

## 2016-07-27 DIAGNOSIS — Z7982 Long term (current) use of aspirin: Secondary | ICD-10-CM | POA: Diagnosis not present

## 2016-07-27 DIAGNOSIS — I509 Heart failure, unspecified: Secondary | ICD-10-CM | POA: Diagnosis not present

## 2016-07-27 DIAGNOSIS — F17210 Nicotine dependence, cigarettes, uncomplicated: Secondary | ICD-10-CM | POA: Insufficient documentation

## 2016-07-27 DIAGNOSIS — Y939 Activity, unspecified: Secondary | ICD-10-CM | POA: Diagnosis not present

## 2016-07-27 DIAGNOSIS — E039 Hypothyroidism, unspecified: Secondary | ICD-10-CM | POA: Diagnosis not present

## 2016-07-27 DIAGNOSIS — I2510 Atherosclerotic heart disease of native coronary artery without angina pectoris: Secondary | ICD-10-CM | POA: Insufficient documentation

## 2016-07-27 DIAGNOSIS — Y999 Unspecified external cause status: Secondary | ICD-10-CM | POA: Diagnosis not present

## 2016-07-27 DIAGNOSIS — Y929 Unspecified place or not applicable: Secondary | ICD-10-CM | POA: Insufficient documentation

## 2016-07-27 DIAGNOSIS — Z79899 Other long term (current) drug therapy: Secondary | ICD-10-CM | POA: Diagnosis not present

## 2016-07-27 HISTORY — DX: Aneurysm of unspecified site: I72.9

## 2016-07-27 MED ORDER — MORPHINE SULFATE (PF) 4 MG/ML IV SOLN
4.0000 mg | Freq: Once | INTRAVENOUS | Status: DC
  Filled 2016-07-27: qty 1

## 2016-07-27 MED ORDER — HYDROMORPHONE HCL 2 MG/ML IJ SOLN
1.0000 mg | Freq: Once | INTRAMUSCULAR | Status: AC
  Administered 2016-07-27: 1 mg via INTRAVENOUS
  Filled 2016-07-27: qty 1

## 2016-07-27 MED ORDER — OXYCODONE-ACETAMINOPHEN 5-325 MG PO TABS
1.0000 | ORAL_TABLET | Freq: Once | ORAL | Status: AC
  Administered 2016-07-27: 1 via ORAL
  Filled 2016-07-27: qty 1

## 2016-07-27 NOTE — ED Triage Notes (Signed)
Pt states she fell on outstretched right hand and now c/o pain at right wrist. Denies LOC or other injury

## 2016-07-27 NOTE — ED Notes (Signed)
Patient transported to X-ray 

## 2016-07-27 NOTE — ED Notes (Signed)
Ortho tech at bedside 

## 2016-07-27 NOTE — Progress Notes (Signed)
Orthopedic Tech Progress Note Patient Details:  [name] 08/09/1966 [id number]  Ortho Devices Type of Ortho Device: Ace wrap, Arm sling, Sugartong splint Ortho Device/Splint Location: rue Ortho Device/Splint Interventions: Application   Vibha Ferdig 07/27/2016, 9:34 AM
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
## 2016-07-27 NOTE — ED Provider Notes (Signed)
[facility] DEPT Provider Note   CSN: [id number] Arrival date & time: 07/27/16  [id number]   History   Chief Complaint Chief Complaint  Patient presents with  . Fall    Wrist Injury    HPI [name] is a 50 y.o. female.  HPI   50 year old female presents today with wrist pain. Patient reports she was walking this morning when she slipped falling to an outstretched hand causing immediate pain to the wrist and proximal right hand. She reports associated swelling, she denies any open wounds. She reports full active range of motion of her fingers decreased range of motion her wrist due to pain. She denies any elbow or shoulder pain, no other injuries from the fall. No medications prior to arrival    Past Medical History:  Diagnosis Date  . Adrenal insufficiency ([place])    diagnosed 2012  . Aneurysm ([place])   . Anxiety   . Astigmatism   . CAD (coronary artery disease)    Cath 2008 EF normal. RCA 50-60, Septal 50%. Myoview 3/12: EF 53% normal perfusion  . Cardiac arrest ([place])    2/2 adissonian crisis  . Cardiomyopathy    resolved  . Chest pain    chronicc  . CHF (congestive heart failure) ([place])   . Chronic back pain   . Chronic diarrhea   . Concussion    sept 28th 2014  . Gastroparesis   . HTN (hypertension)   . Hyperlipidemia   . Hypothyroidism   . Mitral valve prolapse   . Nondiabetic gastroparesis   . QT prolongation   . Tobacco abuse    down to 2 cigarettes per day    Patient Active Problem List   Diagnosis Date Noted  . Myositis 05/11/2016  . Anxiety 05/26/2015  . Tremors of nervous system 05/26/2015  . Neck pain 05/26/2015  . Hot flushes, perimenopausal 07/24/2013  . CAD (coronary artery disease) 04/11/2012  . GERD (gastroesophageal reflux disease) 04/11/2012  . Addison's disease ([place]) 03/03/2012  . QT prolongation 12/15/2010  . Palpitations 01/01/2009  . Hypothyroidism 12/31/2008  . Hyperlipidemia 12/31/2008  . OVERWEIGHT/OBESITY 12/31/2008  .  Essential hypertension 12/31/2008    Past Surgical History:  Procedure Laterality Date  . ABDOMINAL HYSTERECTOMY    . CARDIAC CATHETERIZATION  03/2007   showed 60% lesion in the right coronary artery  . CHOLECYSTECTOMY    . LEFT HEART CATHETERIZATION WITH CORONARY ANGIOGRAM N/A 04/13/2012   Procedure: LEFT HEART CATHETERIZATION WITH CORONARY ANGIOGRAM;  Surgeon: Hillary Bow, MD;  Location: [facility] LAB;  Service: Cardiovascular;  Laterality: N/A;  . SPINE SURGERY    . VARICOSE VEIN SURGERY    . VESICOVAGINAL FISTULA CLOSURE W/ TAH      OB History    Gravida Para Term Preterm AB Living   3 2 2  0 1 2   SAB TAB Ectopic Multiple Live Births   1               Home Medications    Prior to Admission medications   Medication Sig Start Date End Date Taking? Authorizing Provider  amLODipine (NORVASC) 5 MG tablet TAKE 1 TABLET (5 MG TOTAL) BY MOUTH DAILY. 07/25/15  Yes Pixie Casino, MD  aspirin 81 MG chewable tablet Chew 81 mg by mouth daily.    Yes Historical Provider, MD  cholecalciferol (VITAMIN D) 1000 UNITS tablet Take 1,000 Units by mouth daily.   Yes Historical Provider, MD  cyclobenzaprine (FLEXERIL) 5 MG tablet Take 2  tablets (10 mg total) by mouth 3 (three) times daily as needed for muscle spasms. 05/15/16  Yes Jessica Ratliff Hoffman, DO  hydrochlorothiazide (HYDRODIURIL) 12.5 MG tablet Take 6.25 mg by mouth daily as needed (for High Blood Pressure >145/98). Second line medication.   Yes Historical Provider, MD  hydrocortisone (CORTEF) 10 MG tablet Take 10-20 mg by mouth 2 (two) times daily. Takes 20 mg in the morning and 10 mg at night 04/07/16  Yes Historical Provider, MD  levothyroxine (SYNTHROID, LEVOTHROID) 75 MCG tablet Take 75 mcg by mouth daily before breakfast.   Yes Historical Provider, MD  LORazepam (ATIVAN) 2 MG tablet Take 2 mg by mouth 4 (four) times daily as needed for anxiety. 05/06/16  Yes Historical Provider, MD  meclizine (ANTIVERT) 25 MG tablet 1 or 2 tabs PO  q8h prn dizziness Patient taking differently: Take 25-50 mg by mouth 3 (three) times daily as needed (for vertigo).  04/16/16  Yes Francine Graven, DO  metoCLOPramide (REGLAN) 10 MG tablet Take 10 mg by mouth 3 (three) times daily before meals.  06/21/16  Yes Historical Provider, MD  nitroGLYCERIN (NITROSTAT) 0.4 MG SL tablet Place 0.4 mg under the tongue every 5 (five) minutes as needed for chest pain.   Yes Historical Provider, MD  ondansetron (ZOFRAN ODT) 4 MG disintegrating tablet Take 1 tablet (4 mg total) by mouth every 8 (eight) hours as needed for nausea or vomiting. 02/04/16  Yes Ozella Almond Ward, PA-C  oxycodone (ROXICODONE) 15 MG immediate release tablet Take 2 tablets (30 mg total) by mouth every 4 (four) hours as needed for pain. 05/15/16  Yes Jessica Ratliff Hoffman, DO  pantoprazole (PROTONIX) 20 MG tablet Take 1 tablet (20 mg total) by mouth daily. Patient taking differently: Take 20 mg by mouth daily as needed for indigestion.  02/04/16  Yes Jaime Pilcher Ward, PA-C  vitamin C (ASCORBIC ACID) 500 MG tablet Take 500 mg by mouth daily.   Yes Historical Provider, MD  zolpidem (AMBIEN) 10 MG tablet Take 10 mg by mouth at bedtime. 05/06/16  Yes Historical Provider, MD  diazepam (VALIUM) 10 MG tablet Take 1 tablet (10 mg total) by mouth once as needed for anxiety. #42 filled 04/26/16 Patient not taking: Reported on 05/25/2016 05/15/16   Valinda Party, DO  zolpidem (AMBIEN) 5 MG tablet 5mg  by mouth at bedtime as needed for sleep Patient not taking: Reported on 05/25/2016 05/15/16   Valinda Party, DO    Family History Family History  Problem Relation Age of Onset  . Cancer Father     Colon  . Coronary artery disease    . Heart attack Mother     Social History Social History  Substance Use Topics  . Smoking status: Current Every Day Smoker    Packs/day: 0.50    Years: 10.00    Types: Cigarettes  . Smokeless tobacco: Never Used     Comment: smokes 6-7 cigarettes per  day  . Alcohol use No     Allergies   Bee venom; Doxycycline; Erythromycin; Ibuprofen; Morphine and related; Penicillins; Sulfa antibiotics; Cephalexin; Dust mite extract; Tramadol; Other; and Sulfonamide derivatives   Review of Systems Review of Systems  All other systems reviewed and are negative.    Physical Exam Updated Vital Signs BP 114/78 (BP Location: Left Arm)   Pulse 84   Temp 98 F (36.7 C) (Oral)   Resp 16   Ht 5\' 5"  (1.651 m)   Wt 78.9 kg   SpO2 98%  BMI 28.96 kg/m   Physical Exam  Constitutional: She is oriented to person, place, and time. She appears well-developed and well-nourished.  HENT:  Head: Normocephalic and atraumatic.  Eyes: Conjunctivae are normal. Pupils are equal, round, and reactive to light. Right eye exhibits no discharge. Left eye exhibits no discharge. No scleral icterus.  Neck: Normal range of motion. No JVD present. No tracheal deviation present.  Pulmonary/Chest: Effort normal. No stridor.  Musculoskeletal:  Minor swelling to the right proximal wrist, no open wounds, full active range of motion of the fingers and hands, decreased range of motion of the wrist. Elbow nontender, radial pulse 2 plus, sensation intact   Neurological: She is alert and oriented to person, place, and time. Coordination normal.  Psychiatric: She has a normal mood and affect. Her behavior is normal. Judgment and thought content normal.  Nursing note and vitals reviewed.   ED Treatments / Results  Labs (all labs ordered are listed, but only abnormal results are displayed) Labs Reviewed - No data to display  EKG  EKG Interpretation None       Radiology Dg Wrist Complete Right  Result Date: 07/27/2016 CLINICAL DATA:  Pain following fall EXAM: RIGHT WRIST - COMPLETE 3+ VIEW COMPARISON:  Right hand June 17, 2011 FINDINGS: Frontal, oblique, lateral, and ulnar deviation scaphoid images were obtained. There is a fracture of the distal radial metaphysis  with mild impaction at the radial fracture site. There is avulsion of the ulnar styloid. No other fractures are evident. No dislocation. Joint spaces appear unremarkable. No erosive change. IMPRESSION: Fracture distal radial metaphysis with mild impaction at the radial fracture site. Avulsion of the ulnar styloid. No dislocation. Electronically Signed   By: Lowella Grip III M.D.   On: 07/27/2016 08:00   Dg Hand Complete Right  Result Date: 07/27/2016 CLINICAL DATA:  Pain following fall EXAM: RIGHT HAND - COMPLETE 3+ VIEW COMPARISON:  June 17, 2011 FINDINGS: Frontal, oblique and lateral views were obtained. There is a fracture of the distal radial metaphysis with impaction at the distal radial fracture site. There is avulsion of the ulnar styloid. No other fractures are evident. There is no appreciable dislocation. Joint spaces appear normal. No erosive change. IMPRESSION: Impacted fracture distal radial metaphysis. Avulsion ulnar styloid. No other fracture seen elsewhere. No dislocation. No apparent arthropathy. Electronically Signed   By: Lowella Grip III M.D.   On: 07/27/2016 08:01    Procedures Procedures (including critical care time)  Medications Ordered in ED Medications  HYDROmorphone (DILAUDID) injection 1 mg (1 mg Intravenous Given 07/27/16 0735)  oxyCODONE-acetaminophen (PERCOCET/ROXICET) 5-325 MG per tablet 1 tablet (1 tablet Oral Given 07/27/16 [id number])     Initial Impression / Assessment and Plan / ED Course  I have reviewed the triage vital signs and the nursing notes.  Pertinent labs & imaging results that were available during my care of the patient were reviewed by me and considered in my medical decision making (see chart for details).  Clinical Course     Labs:  Imaging:DG wrist complete, DG hand complete  Consults:  Therapeutics:  Discharge Meds:   Assessment/Plan:  50 year old female presents today with distal radial fracture. No reduction needed  here in the ED, she placed in a sugar tong splint and referred to orthopedic and surgery for further evaluation and management. She has no neurological deficits. She has pain medication at home. Strict return precautions given, she verbalized understanding and agreement to today's plan had no further questions or concerns  Final Clinical Impressions(s) / ED Diagnoses   Final diagnoses:  Closed fracture of distal end of right radius, unspecified fracture morphology, initial encounter    New Prescriptions Discharge Medication List as of 07/27/2016  9:40 AM       Okey Regal, PA-C 07/27/16 1322    Everlene Balls, MD 07/27/16 1722

## 2016-07-28 DIAGNOSIS — M25531 Pain in right wrist: Secondary | ICD-10-CM | POA: Diagnosis not present

## 2016-07-28 DIAGNOSIS — S52201A Unspecified fracture of shaft of right ulna, initial encounter for closed fracture: Secondary | ICD-10-CM | POA: Diagnosis not present

## 2016-08-02 ENCOUNTER — Encounter (HOSPITAL_COMMUNITY): Payer: Self-pay

## 2016-08-02 ENCOUNTER — Other Ambulatory Visit: Payer: Self-pay

## 2016-08-02 ENCOUNTER — Emergency Department (HOSPITAL_COMMUNITY): Payer: Medicare Other

## 2016-08-02 ENCOUNTER — Emergency Department (HOSPITAL_COMMUNITY)
Admission: EM | Admit: 2016-08-02 | Discharge: 2016-08-02 | Disposition: A | Discharge status: Home / Self Care | Payer: Medicare Other | Attending: Emergency Medicine | Admitting: Emergency Medicine

## 2016-08-02 DIAGNOSIS — Z79899 Other long term (current) drug therapy: Secondary | ICD-10-CM | POA: Diagnosis not present

## 2016-08-02 DIAGNOSIS — Z7982 Long term (current) use of aspirin: Secondary | ICD-10-CM | POA: Diagnosis not present

## 2016-08-02 DIAGNOSIS — I2510 Atherosclerotic heart disease of native coronary artery without angina pectoris: Secondary | ICD-10-CM | POA: Diagnosis not present

## 2016-08-02 DIAGNOSIS — E039 Hypothyroidism, unspecified: Secondary | ICD-10-CM | POA: Diagnosis not present

## 2016-08-02 DIAGNOSIS — F17210 Nicotine dependence, cigarettes, uncomplicated: Secondary | ICD-10-CM | POA: Insufficient documentation

## 2016-08-02 DIAGNOSIS — I110 Hypertensive heart disease with heart failure: Secondary | ICD-10-CM | POA: Diagnosis not present

## 2016-08-02 DIAGNOSIS — R079 Chest pain, unspecified: Secondary | ICD-10-CM | POA: Diagnosis not present

## 2016-08-02 DIAGNOSIS — I509 Heart failure, unspecified: Secondary | ICD-10-CM | POA: Diagnosis not present

## 2016-08-02 DIAGNOSIS — R109 Unspecified abdominal pain: Secondary | ICD-10-CM | POA: Diagnosis not present

## 2016-08-02 DIAGNOSIS — N12 Tubulo-interstitial nephritis, not specified as acute or chronic: Secondary | ICD-10-CM

## 2016-08-02 LAB — CBC
HEMATOCRIT: 45.2 % (ref 36.0–46.0)
Hemoglobin: 15.3 g/dL — ABNORMAL HIGH (ref 12.0–15.0)
MCH: 28.5 pg (ref 26.0–34.0)
MCHC: 33.8 g/dL (ref 30.0–36.0)
MCV: 84.3 fL (ref 78.0–100.0)
Platelets: 539 10*3/uL — ABNORMAL HIGH (ref 150–400)
RBC: 5.36 MIL/uL — ABNORMAL HIGH (ref 3.87–5.11)
RDW: 14.6 % (ref 11.5–15.5)
WBC: 19.5 10*3/uL — AB (ref 4.0–10.5)

## 2016-08-02 LAB — BASIC METABOLIC PANEL
Anion gap: 9 (ref 5–15)
BUN: 10 mg/dL (ref 6–20)
CALCIUM: 9.6 mg/dL (ref 8.9–10.3)
CO2: 29 mmol/L (ref 22–32)
CREATININE: 1.51 mg/dL — AB (ref 0.44–1.00)
Chloride: 99 mmol/L — ABNORMAL LOW (ref 101–111)
GFR calc Af Amer: 45 mL/min — ABNORMAL LOW (ref >60)
GFR calc non Af Amer: 39 mL/min — ABNORMAL LOW (ref >60)
GLUCOSE: 104 mg/dL — AB (ref 65–99)
Potassium: 4.3 mmol/L (ref 3.5–5.1)
Sodium: 137 mmol/L (ref 135–145)

## 2016-08-02 LAB — URINE MICROSCOPIC-ADD ON

## 2016-08-02 LAB — URINALYSIS, ROUTINE W REFLEX MICROSCOPIC
GLUCOSE, UA: NEGATIVE mg/dL
KETONES UR: NEGATIVE mg/dL
Nitrite: POSITIVE — AB
PH: 5.5 (ref 5.0–8.0)
Protein, ur: 30 mg/dL — AB
Specific Gravity, Urine: 1.022 (ref 1.005–1.030)

## 2016-08-02 LAB — I-STAT TROPONIN, ED: TROPONIN I, POC: 0 ng/mL (ref 0.00–0.08)

## 2016-08-02 LAB — I-STAT CG4 LACTIC ACID, ED
LACTIC ACID, VENOUS: 1.36 mmol/L (ref 0.5–1.9)
Lactic Acid, Venous: 2.09 mmol/L (ref 0.5–1.9)

## 2016-08-02 MED ORDER — PHENAZOPYRIDINE HCL 200 MG PO TABS: 200.0000 mg | ORAL_TABLET | Freq: Three times a day (TID) | ORAL | 0 refills | Status: DC

## 2016-08-02 MED ORDER — KETOROLAC TROMETHAMINE 30 MG/ML IJ SOLN
15.0000 mg | Freq: Once | INTRAMUSCULAR | Status: AC
  Administered 2016-08-02: 15 mg via INTRAVENOUS
  Filled 2016-08-02: qty 1

## 2016-08-02 MED ORDER — CIPROFLOXACIN HCL 500 MG PO TABS: 500.0000 mg | ORAL_TABLET | Freq: Two times a day (BID) | ORAL | 0 refills | Status: DC

## 2016-08-02 MED ORDER — METOCLOPRAMIDE HCL 5 MG/ML IJ SOLN
10.0000 mg | Freq: Once | INTRAMUSCULAR | Status: AC
  Administered 2016-08-02: 10 mg via INTRAVENOUS
  Filled 2016-08-02: qty 2

## 2016-08-02 MED ORDER — HYDROCODONE-ACETAMINOPHEN 5-325 MG PO TABS: 1.0000 | ORAL_TABLET | Freq: Four times a day (QID) | ORAL | 0 refills | Status: DC | PRN

## 2016-08-02 MED ORDER — CEFTRIAXONE SODIUM 1 G IJ SOLR
1.0000 g | Freq: Once | INTRAMUSCULAR | Status: AC
  Administered 2016-08-02: 1 g via INTRAVENOUS
  Filled 2016-08-02: qty 10

## 2016-08-02 MED ORDER — SODIUM CHLORIDE 0.9 % IV BOLUS (SEPSIS)
1000.0000 mL | Freq: Once | INTRAVENOUS | Status: AC
  Administered 2016-08-02: 1000 mL via INTRAVENOUS

## 2016-08-02 MED ORDER — ONDANSETRON 4 MG PO TBDP
ORAL_TABLET | ORAL | Status: AC
  Filled 2016-08-02: qty 1

## 2016-08-02 MED ORDER — FENTANYL CITRATE (PF) 100 MCG/2ML IJ SOLN
50.0000 ug | Freq: Once | INTRAMUSCULAR | Status: AC
  Administered 2016-08-02: 50 ug via INTRAVENOUS
  Filled 2016-08-02: qty 2

## 2016-08-02 MED ORDER — ONDANSETRON 4 MG PO TBDP
4.0000 mg | ORAL_TABLET | Freq: Once | ORAL | Status: AC | PRN
  Administered 2016-08-02: 4 mg via ORAL

## 2016-08-02 NOTE — ED Notes (Signed)
Patient transported to X-ray 

## 2016-08-02 NOTE — ED Provider Notes (Signed)
[facility] DEPT Provider Note   CSN: [id number] Arrival date & time: 08/02/16  1321     History   Chief Complaint Chief Complaint  Patient presents with  . Flank Pain  . Urinary Retention    HPI [name] is a 50 y.o. female.  HPI   Patient presents with right flank pain that has been constant since yesterday, with malodorous urine with decreased urinary output, associated nausea and vomiting.  Denies any bowel changes - had two bowel movements yesterday that were normal.   While in the waiting room she developed central chest pain that feels like stabbing and pressure, lasted about 15 minutes and resolved.  States this felt something like her prior gastroparesis pain.    Past abdominal surgeries: hysterectomy for endometriosis, cholecystectomy.    Past Medical History:  Diagnosis Date  . Adrenal insufficiency ([place])    diagnosed 2012  . Aneurysm ([place])   . Anxiety   . Astigmatism   . CAD (coronary artery disease)    Cath 2008 EF normal. RCA 50-60, Septal 50%. Myoview 3/12: EF 53% normal perfusion  . Cardiac arrest ([place])    2/2 adissonian crisis  . Cardiomyopathy    resolved  . Chest pain    chronicc  . CHF (congestive heart failure) ([place])   . Chronic back pain   . Chronic diarrhea   . Concussion    sept 28th 2014  . Gastroparesis   . HTN (hypertension)   . Hyperlipidemia   . Hypothyroidism   . Mitral valve prolapse   . Nondiabetic gastroparesis   . QT prolongation   . Tobacco abuse    down to 2 cigarettes per day    Patient Active Problem List   Diagnosis Date Noted  . Myositis 05/11/2016  . Anxiety 05/26/2015  . Tremors of nervous system 05/26/2015  . Neck pain 05/26/2015  . Hot flushes, perimenopausal 07/24/2013  . CAD (coronary artery disease) 04/11/2012  . GERD (gastroesophageal reflux disease) 04/11/2012  . Addison's disease ([place]) 03/03/2012  . QT prolongation 12/15/2010  . Palpitations 01/01/2009  . Hypothyroidism 12/31/2008  .  Hyperlipidemia 12/31/2008  . OVERWEIGHT/OBESITY 12/31/2008  . Essential hypertension 12/31/2008    Past Surgical History:  Procedure Laterality Date  . ABDOMINAL HYSTERECTOMY    . CARDIAC CATHETERIZATION  03/2007   showed 60% lesion in the right coronary artery  . CHOLECYSTECTOMY    . LEFT HEART CATHETERIZATION WITH CORONARY ANGIOGRAM N/A 04/13/2012   Procedure: LEFT HEART CATHETERIZATION WITH CORONARY ANGIOGRAM;  Surgeon: Hillary Bow, MD;  Location: [facility] CATH LAB;  Service: Cardiovascular;  Laterality: N/A;  . SPINE SURGERY    . VARICOSE VEIN SURGERY    . VESICOVAGINAL FISTULA CLOSURE W/ TAH      OB History    Gravida Para Term Preterm AB Living   3 2 2  0 1 2   SAB TAB Ectopic Multiple Live Births   1               Home Medications    Prior to Admission medications   Medication Sig Start Date End Date Taking? Authorizing Provider  amLODipine (NORVASC) 5 MG tablet TAKE 1 TABLET (5 MG TOTAL) BY MOUTH DAILY. 07/25/15   Pixie Casino, MD  aspirin 81 MG chewable tablet Chew 81 mg by mouth daily.     Historical Provider, MD  cholecalciferol (VITAMIN D) 1000 UNITS tablet Take 1,000 Units by mouth daily.    Historical Provider, MD  cyclobenzaprine (FLEXERIL)  5 MG tablet Take 2 tablets (10 mg total) by mouth 3 (three) times daily as needed for muscle spasms. 05/15/16   Jessica Ratliff Hoffman, DO  diazepam (VALIUM) 10 MG tablet Take 1 tablet (10 mg total) by mouth once as needed for anxiety. #42 filled 04/26/16 Patient not taking: Reported on 05/25/2016 05/15/16   Valinda Party, DO  hydrochlorothiazide (HYDRODIURIL) 12.5 MG tablet Take 6.25 mg by mouth daily as needed (for High Blood Pressure >145/98). Second line medication.    Historical Provider, MD  hydrocortisone (CORTEF) 10 MG tablet Take 10-20 mg by mouth 2 (two) times daily. Takes 20 mg in the morning and 10 mg at night 04/07/16   Historical Provider, MD  levothyroxine (SYNTHROID, LEVOTHROID) 75 MCG tablet Take 75 mcg by  mouth daily before breakfast.    Historical Provider, MD  LORazepam (ATIVAN) 2 MG tablet Take 2 mg by mouth 4 (four) times daily as needed for anxiety. 05/06/16   Historical Provider, MD  meclizine (ANTIVERT) 25 MG tablet 1 or 2 tabs PO q8h prn dizziness Patient taking differently: Take 25-50 mg by mouth 3 (three) times daily as needed (for vertigo).  04/16/16   Francine Graven, DO  metoCLOPramide (REGLAN) 10 MG tablet Take 10 mg by mouth 3 (three) times daily before meals.  06/21/16   Historical Provider, MD  nitroGLYCERIN (NITROSTAT) 0.4 MG SL tablet Place 0.4 mg under the tongue every 5 (five) minutes as needed for chest pain.    Historical Provider, MD  ondansetron (ZOFRAN ODT) 4 MG disintegrating tablet Take 1 tablet (4 mg total) by mouth every 8 (eight) hours as needed for nausea or vomiting. 02/04/16   Ozella Almond Ward, PA-C  oxycodone (ROXICODONE) 15 MG immediate release tablet Take 2 tablets (30 mg total) by mouth every 4 (four) hours as needed for pain. 05/15/16   Jessica Ratliff Hoffman, DO  pantoprazole (PROTONIX) 20 MG tablet Take 1 tablet (20 mg total) by mouth daily. Patient taking differently: Take 20 mg by mouth daily as needed for indigestion.  02/04/16   Ozella Almond Ward, PA-C  vitamin C (ASCORBIC ACID) 500 MG tablet Take 500 mg by mouth daily.    Historical Provider, MD  zolpidem (AMBIEN) 10 MG tablet Take 10 mg by mouth at bedtime. 05/06/16   Historical Provider, MD  zolpidem (AMBIEN) 5 MG tablet 5mg  by mouth at bedtime as needed for sleep Patient not taking: Reported on 05/25/2016 05/15/16   Valinda Party, DO    Family History Family History  Problem Relation Age of Onset  . Cancer Father     Colon  . Coronary artery disease    . Heart attack Mother     Social History Social History  Substance Use Topics  . Smoking status: Current Every Day Smoker    Packs/day: 0.50    Years: 10.00    Types: Cigarettes  . Smokeless tobacco: Never Used     Comment: smokes  6-7 cigarettes per day  . Alcohol use No     Allergies   Bee venom; Doxycycline; Erythromycin; Ibuprofen; Morphine and related; Penicillins; Sulfa antibiotics; Cephalexin; Dust mite extract; Tramadol; Other; and Sulfonamide derivatives   Review of Systems Review of Systems  All other systems reviewed and are negative.    Physical Exam Updated Vital Signs BP 113/55   Pulse 77   Temp 98.5 F (36.9 C) (Oral)   Resp 18   SpO2 98%   Physical Exam  Constitutional: She appears well-developed and well-nourished.  No distress.  HENT:  Head: Normocephalic and atraumatic.  Neck: Neck supple.  Cardiovascular: Normal rate and regular rhythm.   Pulmonary/Chest: Effort normal and breath sounds normal. No respiratory distress. She has no wheezes. She has no rales.  Abdominal: Soft. She exhibits no distension. There is tenderness. There is no rebound and no guarding.  Right sided abdominal/flank tenderness.    Neurological: She is alert.  Skin: She is not diaphoretic.  Nursing note and vitals reviewed.    ED Treatments / Results  Labs (all labs ordered are listed, but only abnormal results are displayed) Labs Reviewed  BASIC METABOLIC PANEL - Abnormal; Notable for the following:       Result Value   Chloride 99 (*)    Glucose, Bld 104 (*)    Creatinine, Ser 1.51 (*)    GFR calc non Af Amer 39 (*)    GFR calc Af Amer 45 (*)    All other components within normal limits  CBC - Abnormal; Notable for the following:    WBC 19.5 (*)    RBC 5.36 (*)    Hemoglobin 15.3 (*)    Platelets 539 (*)    All other components within normal limits  I-STAT CG4 LACTIC ACID, ED - Abnormal; Notable for the following:    Lactic Acid, Venous 2.09 (*)    All other components within normal limits  URINE CULTURE  URINALYSIS, ROUTINE W REFLEX MICROSCOPIC (NOT AT  [facility])  I-STAT CG4 LACTIC ACID, ED  Randolm Idol, ED    EKG  EKG Interpretation None       Radiology Dg Chest 2 View  Result  Date: 08/02/2016 CLINICAL DATA:  Mid chest pain beginning today. Unable to void without burning. EXAM: CHEST  2 VIEW COMPARISON:  Chest radiograph May 25, 2016 FINDINGS: Cardiomediastinal silhouette is normal. No pleural effusions or focal consolidations. Trachea projects midline and there is no pneumothorax. Soft tissue planes and included osseous structures are non-suspicious. ACDF. Surgical clips in the included right abdomen compatible with cholecystectomy. IMPRESSION: Normal chest. Electronically Signed   By: Elon Alas M.D.   On: 08/02/2016 19:05   Ct Renal Stone Study  Result Date: 08/02/2016 CLINICAL DATA:  Right flank pain.  Assess for kidney stones. EXAM: CT ABDOMEN AND PELVIS WITHOUT CONTRAST TECHNIQUE: Multidetector CT imaging of the abdomen and pelvis was performed following the standard protocol without IV contrast. COMPARISON:  02/04/2016.  09/19/2015. FINDINGS: Lower chest: Mild chronic scarring at the lung bases. No active process. Hepatobiliary: Liver appears normal without contrast. Previous cholecystectomy. Pancreas: Normal Spleen: Normal Adrenals/Urinary Tract: Adrenal glands are normal. Kidneys are normal. No cyst, mass, stone or hydronephrosis. No evidence of passing stone. No stone in the bladder. Stomach/Bowel: Fairly large amount of gas and stool in the colon. Otherwise unremarkable. Vascular/Lymphatic: Aortic atherosclerosis. No aneurysm. IVC unremarkable. No adenopathy. Reproductive: Previous hysterectomy. Other: No free fluid or air. Musculoskeletal: Negative IMPRESSION: No urinary tract stone disease. No evidence of other urinary tract pathology. Previous cholecystectomy and hysterectomy. Fairly large amount of gas and stool on the colon. No evidence of ileus, obstruction, free air or free fluid. Aortic atherosclerosis. Electronically Signed   By: Nelson Chimes M.D.   On: 08/02/2016 19:35    Procedures Procedures (including critical care time)  Medications  Ordered in ED Medications  ondansetron (ZOFRAN-ODT) 4 MG disintegrating tablet (not administered)  cefTRIAXone (ROCEPHIN) 1 g in dextrose 5 % 50 mL IVPB (not administered)  ondansetron (ZOFRAN-ODT) disintegrating tablet 4 mg (4 mg  Oral Given 08/02/16 1409)  sodium chloride 0.9 % bolus 1,000 mL (1,000 mLs Intravenous New Bag/Given 08/02/16 1956)  fentaNYL (SUBLIMAZE) injection 50 mcg (50 mcg Intravenous Given 08/02/16 1957)  sodium chloride 0.9 % bolus 1,000 mL (1,000 mLs Intravenous New Bag/Given 08/02/16 1957)  metoCLOPramide (REGLAN) injection 10 mg (10 mg Intravenous Given 08/02/16 1957)     Initial Impression / Assessment and Plan / ED Course  I have reviewed the triage vital signs and the nursing notes.  Pertinent labs & imaging results that were available during my care of the patient were reviewed by me and considered in my medical decision making (see chart for details).  Clinical Course     Afebrile nontoxic patient with right flank pain and dysuria, malodorous urine.  Labs significant for leukocytosis, renal insufficiency.  Pt given IVF.  Very little urine in bladder with in and out cath.  UA pending at change of shift.  Pt receiving IVF, antibiotics.  Much more comfortable after pain/nausea medication.  CT demonstrates no ureteral stone.  Pt signed out to [employer], PA-C, at change of shift pending UA results and continued treatment, reassessment.    Final Clinical Impressions(s) / ED Diagnoses   Final diagnoses:  None    New Prescriptions New Prescriptions   No medications on file     Clayton Bibles, PA-C 08/02/16 2024    Tanna Furry, MD 08/14/16 1818

## 2016-08-02 NOTE — ED Triage Notes (Signed)
Pt brought back to triage stating her back and chest hurt like she is being stabbed. EKG preformed, will add on troponin and chest x-ray. Vital signs rechecked.

## 2016-08-02 NOTE — Discharge Instructions (Signed)
Take ciprofloxacin as prescribed until finished. Take Pyridium for bladder spasms and pain. This may turn your urine orange in color. You may take Norco as needed for severe pain. Follow-up with your primary care doctor to ensure resolution of symptoms. Return for new or worsening symptoms.

## 2016-08-02 NOTE — ED Triage Notes (Signed)
Pt reports right side flank pain X2 days. Pt reports decreased urination yesterday and unable to urinate today. Pt states she has hx of rhabdo. She also reports nausea.

## 2016-08-02 NOTE — ED Notes (Signed)
Bladder scanned patient, highest amount able to track was 124ml.

## 2016-08-02 NOTE — ED Provider Notes (Signed)
10:30PM The patient has been reassessed. She is nontoxic appearing and states that she is feeling better. Vitals stable. Patient afebrile. Her lactate has cleared. Urinalysis is consistent with urinary tract infection. Culture sent.   Given location of pain, pyelonephritis is suspected. An initial dose of IV Rocephin has been given. The patient tolerated this well. She has been able to void following hydration with 2L IVF. Plan to discharge on ciprofloxacin for treatment of pyelonephritis. Return precautions discussed and provided. Patient discharged in stable condition with no unaddressed concerns.   Results for orders placed or performed during the hospital encounter of 08/02/16  Urinalysis, Routine w reflex microscopic- may I&O cath if menses  Result Value Ref Range   Color, Urine YELLOW YELLOW   APPearance CLOUDY (A) CLEAR   Specific Gravity, Urine 1.022 1.005 - 1.030   pH 5.5 5.0 - 8.0   Glucose, UA NEGATIVE NEGATIVE mg/dL   Hgb urine dipstick LARGE (A) NEGATIVE   Bilirubin Urine SMALL (A) NEGATIVE   Ketones, ur NEGATIVE NEGATIVE mg/dL   Protein, ur 30 (A) NEGATIVE mg/dL   Nitrite POSITIVE (A) NEGATIVE   Leukocytes, UA LARGE (A) NEGATIVE  Basic metabolic panel  Result Value Ref Range   Sodium 137 135 - 145 mmol/L   Potassium 4.3 3.5 - 5.1 mmol/L   Chloride 99 (L) 101 - 111 mmol/L   CO2 29 22 - 32 mmol/L   Glucose, Bld 104 (H) 65 - 99 mg/dL   BUN 10 6 - 20 mg/dL   Creatinine, Ser 1.51 (H) 0.44 - 1.00 mg/dL   Calcium 9.6 8.9 - 10.3 mg/dL   GFR calc non Af Amer 39 (L) >60 mL/min   GFR calc Af Amer 45 (L) >60 mL/min   Anion gap 9 5 - 15  CBC  Result Value Ref Range   WBC 19.5 (H) 4.0 - 10.5 K/uL   RBC 5.36 (H) 3.87 - 5.11 MIL/uL   Hemoglobin 15.3 (H) 12.0 - 15.0 g/dL   HCT 45.2 36.0 - 46.0 %   MCV 84.3 78.0 - 100.0 fL   MCH 28.5 26.0 - 34.0 pg   MCHC 33.8 30.0 - 36.0 g/dL   RDW 14.6 11.5 - 15.5 %   Platelets 539 (H) 150 - 400 K/uL  Urine microscopic-add on  Result Value  Ref Range   Squamous Epithelial / LPF 0-5 (A) NONE SEEN   WBC, UA TOO NUMEROUS TO COUNT 0 - 5 WBC/hpf   RBC / HPF 6-30 0 - 5 RBC/hpf   Bacteria, UA MANY (A) NONE SEEN   Casts HYALINE CASTS (A) NEGATIVE  I-Stat CG4 Lactic Acid, ED  Result Value Ref Range   Lactic Acid, Venous 2.09 (HH) 0.5 - 1.9 mmol/L   Comment NOTIFIED PHYSICIAN   I-Stat CG4 Lactic Acid, ED  Result Value Ref Range   Lactic Acid, Venous 1.36 0.5 - 1.9 mmol/L  I-Stat Troponin, ED (not at [facility])  Result Value Ref Range   Troponin i, poc 0.00 0.00 - 0.08 ng/mL   Comment 3           Dg Chest 2 View  Result Date: 08/02/2016 CLINICAL DATA:  Mid chest pain beginning today. Unable to void without burning. EXAM: CHEST  2 VIEW COMPARISON:  Chest radiograph May 25, 2016 FINDINGS: Cardiomediastinal silhouette is normal. No pleural effusions or focal consolidations. Trachea projects midline and there is no pneumothorax. Soft tissue planes and included osseous structures are non-suspicious. ACDF. Surgical clips in the included right abdomen  compatible with cholecystectomy. IMPRESSION: Normal chest. Electronically Signed   By: Elon Alas M.D.   On: 08/02/2016 19:05   Dg Wrist Complete Right  Result Date: 07/27/2016 CLINICAL DATA:  Pain following fall EXAM: RIGHT WRIST - COMPLETE 3+ VIEW COMPARISON:  Right hand June 17, 2011 FINDINGS: Frontal, oblique, lateral, and ulnar deviation scaphoid images were obtained. There is a fracture of the distal radial metaphysis with mild impaction at the radial fracture site. There is avulsion of the ulnar styloid. No other fractures are evident. No dislocation. Joint spaces appear unremarkable. No erosive change. IMPRESSION: Fracture distal radial metaphysis with mild impaction at the radial fracture site. Avulsion of the ulnar styloid. No dislocation. Electronically Signed   By: Lowella Grip III M.D.   On: 07/27/2016 08:00   Dg Hand Complete Right  Result Date:  07/27/2016 CLINICAL DATA:  Pain following fall EXAM: RIGHT HAND - COMPLETE 3+ VIEW COMPARISON:  June 17, 2011 FINDINGS: Frontal, oblique and lateral views were obtained. There is a fracture of the distal radial metaphysis with impaction at the distal radial fracture site. There is avulsion of the ulnar styloid. No other fractures are evident. There is no appreciable dislocation. Joint spaces appear normal. No erosive change. IMPRESSION: Impacted fracture distal radial metaphysis. Avulsion ulnar styloid. No other fracture seen elsewhere. No dislocation. No apparent arthropathy. Electronically Signed   By: Lowella Grip III M.D.   On: 07/27/2016 08:01   Ct Renal Stone Study  Result Date: 08/02/2016 CLINICAL DATA:  Right flank pain.  Assess for kidney stones. EXAM: CT ABDOMEN AND PELVIS WITHOUT CONTRAST TECHNIQUE: Multidetector CT imaging of the abdomen and pelvis was performed following the standard protocol without IV contrast. COMPARISON:  02/04/2016.  09/19/2015. FINDINGS: Lower chest: Mild chronic scarring at the lung bases. No active process. Hepatobiliary: Liver appears normal without contrast. Previous cholecystectomy. Pancreas: Normal Spleen: Normal Adrenals/Urinary Tract: Adrenal glands are normal. Kidneys are normal. No cyst, mass, stone or hydronephrosis. No evidence of passing stone. No stone in the bladder. Stomach/Bowel: Fairly large amount of gas and stool in the colon. Otherwise unremarkable. Vascular/Lymphatic: Aortic atherosclerosis. No aneurysm. IVC unremarkable. No adenopathy. Reproductive: Previous hysterectomy. Other: No free fluid or air. Musculoskeletal: Negative IMPRESSION: No urinary tract stone disease. No evidence of other urinary tract pathology. Previous cholecystectomy and hysterectomy. Fairly large amount of gas and stool on the colon. No evidence of ileus, obstruction, free air or free fluid. Aortic atherosclerosis. Electronically Signed   By: Nelson Chimes M.D.   On:  08/02/2016 19:35      [name], PA-C 08/02/16 [place], MD 08/14/16 1816

## 2016-08-02 NOTE — ED Notes (Signed)
Pt is in stable condition upon d/c and ambulates from ED. 

## 2016-08-02 NOTE — ED Notes (Signed)
Called Lab for urine culture add-on

## 2016-08-03 ENCOUNTER — Other Ambulatory Visit: Payer: Self-pay

## 2016-08-03 NOTE — Patient Outreach (Signed)
[facility]) Care Management  08/03/2016  [name] 1966/04/20 [id number]   Telephone call to patient for telephone screen. Explained to patient [name] and how [facility] could assist her with her health care needs.  Patient declines and states that she has gone to ER recently for a fall in which she fractured her wrist and has follow up with physician for care of that.  Patient reports that she went to the ER on yesterday because she could not void and it turned out to be a urinary tract infection. Patient also shares she has good family support and they help her with her needs as well.  Discussed with patient 24 nurse line and how this could assist to direct her for after hours needs.   Patient again declined services but agrees to receive letter and brochure to review.    Plan: RN Health Coach will send a letter and brochure for patient review.  RN Health Coach will notify care management assistant of patient case closure.   Jone Baseman, RN, MSN [place] [phone]

## 2016-08-05 LAB — URINE CULTURE: Culture: >=100000 — AB

## 2016-08-06 ENCOUNTER — Telehealth (HOSPITAL_BASED_OUTPATIENT_CLINIC_OR_DEPARTMENT_OTHER): Payer: Self-pay

## 2016-08-06 ENCOUNTER — Telehealth: Payer: Self-pay | Admitting: *Deleted

## 2016-08-06 NOTE — Telephone Encounter (Addendum)
Post ED Visit - Positive Culture Follow-up  Culture report reviewed by antimicrobial stewardship pharmacist:  []  Elenor Quinones, Pharm.D. []  Heide Guile, Pharm.D., BCPS []  Parks Neptune, Pharm.D. []  Alycia Rossetti, Pharm.D., BCPS []  [place], Pharm.D., BCPS, AAHIVP []  Legrand Como, Pharm.D., BCPS, AAHIVP []  Milus Glazier, Pharm.D. []  Stephens November, [place].DAbram Sander, Pharm.D.  Positive urine culture, >/= 100,000 colonies -> E Coli Treated with Cipro, organism sensitive to the same and no further patient follow-up is required at this time.  Dortha Kern 08/06/2016, 12:11 PM

## 2016-08-09 DIAGNOSIS — M25531 Pain in right wrist: Secondary | ICD-10-CM | POA: Diagnosis not present

## 2016-08-09 DIAGNOSIS — S52201D Unspecified fracture of shaft of right ulna, subsequent encounter for closed fracture with routine healing: Secondary | ICD-10-CM | POA: Diagnosis not present

## 2016-08-23 DIAGNOSIS — S52201D Unspecified fracture of shaft of right ulna, subsequent encounter for closed fracture with routine healing: Secondary | ICD-10-CM | POA: Diagnosis not present

## 2016-08-25 ENCOUNTER — Emergency Department (HOSPITAL_COMMUNITY)
Admission: EM | Admit: 2016-08-25 | Discharge: 2016-08-25 | Disposition: A | Discharge status: Home / Self Care | Payer: Medicare Other | Attending: Emergency Medicine | Admitting: Emergency Medicine

## 2016-08-25 ENCOUNTER — Encounter (HOSPITAL_COMMUNITY): Payer: Self-pay | Admitting: Neurology

## 2016-08-25 ENCOUNTER — Emergency Department (HOSPITAL_COMMUNITY): Payer: Medicare Other

## 2016-08-25 DIAGNOSIS — I2510 Atherosclerotic heart disease of native coronary artery without angina pectoris: Secondary | ICD-10-CM | POA: Insufficient documentation

## 2016-08-25 DIAGNOSIS — F17210 Nicotine dependence, cigarettes, uncomplicated: Secondary | ICD-10-CM | POA: Insufficient documentation

## 2016-08-25 DIAGNOSIS — Y999 Unspecified external cause status: Secondary | ICD-10-CM | POA: Insufficient documentation

## 2016-08-25 DIAGNOSIS — Y9389 Activity, other specified: Secondary | ICD-10-CM | POA: Insufficient documentation

## 2016-08-25 DIAGNOSIS — E039 Hypothyroidism, unspecified: Secondary | ICD-10-CM | POA: Insufficient documentation

## 2016-08-25 DIAGNOSIS — I509 Heart failure, unspecified: Secondary | ICD-10-CM | POA: Insufficient documentation

## 2016-08-25 DIAGNOSIS — I110 Hypertensive heart disease with heart failure: Secondary | ICD-10-CM | POA: Insufficient documentation

## 2016-08-25 DIAGNOSIS — Z7982 Long term (current) use of aspirin: Secondary | ICD-10-CM | POA: Insufficient documentation

## 2016-08-25 DIAGNOSIS — S61452A Open bite of left hand, initial encounter: Secondary | ICD-10-CM | POA: Insufficient documentation

## 2016-08-25 DIAGNOSIS — Z203 Contact with and (suspected) exposure to rabies: Secondary | ICD-10-CM | POA: Insufficient documentation

## 2016-08-25 DIAGNOSIS — Z23 Encounter for immunization: Secondary | ICD-10-CM | POA: Diagnosis not present

## 2016-08-25 DIAGNOSIS — W540XXA Bitten by dog, initial encounter: Secondary | ICD-10-CM | POA: Diagnosis not present

## 2016-08-25 DIAGNOSIS — Y929 Unspecified place or not applicable: Secondary | ICD-10-CM | POA: Insufficient documentation

## 2016-08-25 DIAGNOSIS — S61432A Puncture wound without foreign body of left hand, initial encounter: Secondary | ICD-10-CM | POA: Diagnosis not present

## 2016-08-25 DIAGNOSIS — L089 Local infection of the skin and subcutaneous tissue, unspecified: Secondary | ICD-10-CM

## 2016-08-25 MED ORDER — METRONIDAZOLE 500 MG PO TABS: 500.0000 mg | ORAL_TABLET | Freq: Three times a day (TID) | ORAL | 0 refills | Status: AC

## 2016-08-25 MED ORDER — LIDOCAINE-EPINEPHRINE-TETRACAINE (LET) SOLUTION
3.0000 mL | Freq: Once | NASAL | Status: AC
  Administered 2016-08-25: 3 mL via TOPICAL
  Filled 2016-08-25: qty 3

## 2016-08-25 MED ORDER — TETANUS-DIPHTH-ACELL PERTUSSIS 5-2.5-18.5 LF-MCG/0.5 IM SUSP
0.5000 mL | Freq: Once | INTRAMUSCULAR | Status: AC
  Administered 2016-08-25: 0.5 mL via INTRAMUSCULAR
  Filled 2016-08-25: qty 0.5

## 2016-08-25 MED ORDER — ONDANSETRON HCL 4 MG PO TABS: 4.0000 mg | ORAL_TABLET | Freq: Three times a day (TID) | ORAL | 0 refills | Status: DC | PRN

## 2016-08-25 MED ORDER — RABIES IMMUNE GLOBULIN 150 UNIT/ML IM INJ
20.0000 [IU]/kg | INJECTION | Freq: Once | INTRAMUSCULAR | Status: AC
  Administered 2016-08-25: 1650 [IU]
  Filled 2016-08-25: qty 11

## 2016-08-25 MED ORDER — DOXYCYCLINE HYCLATE 100 MG PO TABS
100.0000 mg | ORAL_TABLET | Freq: Once | ORAL | Status: AC
  Administered 2016-08-25: 100 mg via ORAL
  Filled 2016-08-25: qty 1

## 2016-08-25 MED ORDER — ONDANSETRON 4 MG PO TBDP
4.0000 mg | ORAL_TABLET | Freq: Once | ORAL | Status: AC
  Administered 2016-08-25: 4 mg via ORAL
  Filled 2016-08-25: qty 1

## 2016-08-25 MED ORDER — DOXYCYCLINE HYCLATE 100 MG PO CAPS: 100.0000 mg | ORAL_CAPSULE | Freq: Two times a day (BID) | ORAL | 0 refills | Status: DC

## 2016-08-25 MED ORDER — RABIES VACCINE, PCEC IM SUSR
1.0000 mL | Freq: Once | INTRAMUSCULAR | Status: AC
Start: 2016-08-25 — End: 2016-08-25
  Administered 2016-08-25: 1 mL via INTRAMUSCULAR
  Filled 2016-08-25: qty 1

## 2016-08-25 MED ORDER — METRONIDAZOLE 500 MG PO TABS
500.0000 mg | ORAL_TABLET | Freq: Once | ORAL | Status: AC
  Administered 2016-08-25: 500 mg via ORAL
  Filled 2016-08-25: qty 1

## 2016-08-25 MED ORDER — HYDROCODONE-ACETAMINOPHEN 5-325 MG PO TABS: ORAL_TABLET | ORAL | 0 refills | Status: DC

## 2016-08-25 NOTE — Discharge Instructions (Signed)
Return to the emergency department for a wound check in 48 hours. Do not hesitate to return to the emergency department for any new or worsening symptoms including fever, chills, streaking up the hand, increasing swelling or significant pain   Take your antibiotics as directed and to completion. You should never have any leftover antibiotics! Push fluids and stay well hydrated.   Any antibiotic use can reduce the efficacy of hormonal birth control. Please use back up method of contraception.   Do not drink alcohol while you are taking flagyl (metronidazole) because it will make you very sick.   Please go to the urgent care for booster shots of the rabies vaccination as follows: DAY 0:  08/25/2016      DAY 3:  08/28/2016       DAY 7:  09/01/2016     DAY 14:  09/08/2016

## 2016-08-25 NOTE — ED Provider Notes (Signed)
[facility] DEPT Provider Note   CSN: [id number] Arrival date & time: 08/25/16  1453     History   Chief Complaint Chief Complaint  Patient presents with  . Animal Bite    HPI   Blood pressure 113/74, pulse 96, temperature 98.2 F (36.8 C), temperature source Oral, resp. rate 20, weight 80.7 kg, SpO2 94 %.  [name] is a 50 y.o. female complaining ofDog bite to left (nondominant hand). Patient was getting out of her car yesterday and unknown dog bit her in the left hand. Last tetanus shot is unknown, pain is severe and she has been having increasing redness and swelling to the area.   Past Medical History:  Diagnosis Date  . Adrenal insufficiency ([place])    diagnosed 2012  . Aneurysm ([place])   . Anxiety   . Astigmatism   . CAD (coronary artery disease)    Cath 2008 EF normal. RCA 50-60, Septal 50%. Myoview 3/12: EF 53% normal perfusion  . Cardiac arrest ([place])    2/2 adissonian crisis  . Cardiomyopathy    resolved  . Chest pain    chronicc  . CHF (congestive heart failure) ([place])   . Chronic back pain   . Chronic diarrhea   . Concussion    sept 28th 2014  . Gastroparesis   . HTN (hypertension)   . Hyperlipidemia   . Hypothyroidism   . Mitral valve prolapse   . Nondiabetic gastroparesis   . QT prolongation   . Tobacco abuse    down to 2 cigarettes per day    Patient Active Problem List   Diagnosis Date Noted  . Myositis 05/11/2016  . Anxiety 05/26/2015  . Tremors of nervous system 05/26/2015  . Neck pain 05/26/2015  . Hot flushes, perimenopausal 07/24/2013  . CAD (coronary artery disease) 04/11/2012  . GERD (gastroesophageal reflux disease) 04/11/2012  . Addison's disease ([place]) 03/03/2012  . QT prolongation 12/15/2010  . Palpitations 01/01/2009  . Hypothyroidism 12/31/2008  . Hyperlipidemia 12/31/2008  . OVERWEIGHT/OBESITY 12/31/2008  . Essential hypertension 12/31/2008    Past Surgical History:  Procedure Laterality Date  . ABDOMINAL  HYSTERECTOMY    . CARDIAC CATHETERIZATION  03/2007   showed 60% lesion in the right coronary artery  . CHOLECYSTECTOMY    . LEFT HEART CATHETERIZATION WITH CORONARY ANGIOGRAM N/A 04/13/2012   Procedure: LEFT HEART CATHETERIZATION WITH CORONARY ANGIOGRAM;  Surgeon: Hillary Bow, MD;  Location: [facility] CATH LAB;  Service: Cardiovascular;  Laterality: N/A;  . SPINE SURGERY    . VARICOSE VEIN SURGERY    . VESICOVAGINAL FISTULA CLOSURE W/ TAH      OB History    Gravida Para Term Preterm AB Living   3 2 2  0 1 2   SAB TAB Ectopic Multiple Live Births   1               Home Medications    Prior to Admission medications   Medication Sig Start Date End Date Taking? Authorizing Provider  amLODipine (NORVASC) 5 MG tablet TAKE 1 TABLET (5 MG TOTAL) BY MOUTH DAILY. 07/25/15   Pixie Casino, MD  aspirin 81 MG chewable tablet Chew 81 mg by mouth daily.     Historical Provider, MD  cholecalciferol (VITAMIN D) 1000 UNITS tablet Take 1,000 Units by mouth daily.    Historical Provider, MD  ciprofloxacin (CIPRO) 500 MG tablet Take 1 tablet (500 mg total) by mouth 2 (two) times daily. 08/02/16   [name], PA-C  cyclobenzaprine (FLEXERIL) 5 MG tablet Take 2 tablets (10 mg total) by mouth 3 (three) times daily as needed for muscle spasms. 05/15/16   Jessica Ratliff Hoffman, DO  diazepam (VALIUM) 10 MG tablet Take 1 tablet (10 mg total) by mouth once as needed for anxiety. #42 filled 04/26/16 Patient not taking: Reported on 05/25/2016 05/15/16   Valinda Party, DO  hydrochlorothiazide (HYDRODIURIL) 12.5 MG tablet Take 6.25 mg by mouth daily as needed (for High Blood Pressure >145/98). Second line medication.    Historical Provider, MD  HYDROcodone-acetaminophen (NORCO/VICODIN) 5-325 MG tablet Take 1-2 tablets by mouth every 6 (six) hours as needed for severe pain. 08/02/16   [name], PA-C  hydrocortisone (CORTEF) 10 MG tablet Take 10-20 mg by mouth 2 (two) times daily. Takes 20 mg in the morning and  10 mg at night 04/07/16   Historical Provider, MD  levothyroxine (SYNTHROID, LEVOTHROID) 75 MCG tablet Take 75 mcg by mouth daily before breakfast.    Historical Provider, MD  LORazepam (ATIVAN) 2 MG tablet Take 2 mg by mouth 4 (four) times daily as needed for anxiety. 05/06/16   Historical Provider, MD  meclizine (ANTIVERT) 25 MG tablet 1 or 2 tabs PO q8h prn dizziness Patient taking differently: Take 25-50 mg by mouth 3 (three) times daily as needed (for vertigo).  04/16/16   Francine Graven, DO  metoCLOPramide (REGLAN) 10 MG tablet Take 10 mg by mouth 3 (three) times daily before meals.  06/21/16   Historical Provider, MD  nitroGLYCERIN (NITROSTAT) 0.4 MG SL tablet Place 0.4 mg under the tongue every 5 (five) minutes as needed for chest pain.    Historical Provider, MD  ondansetron (ZOFRAN ODT) 4 MG disintegrating tablet Take 1 tablet (4 mg total) by mouth every 8 (eight) hours as needed for nausea or vomiting. 02/04/16   Ozella Almond Ward, PA-C  oxycodone (ROXICODONE) 15 MG immediate release tablet Take 2 tablets (30 mg total) by mouth every 4 (four) hours as needed for pain. 05/15/16   Jessica Ratliff Hoffman, DO  pantoprazole (PROTONIX) 20 MG tablet Take 1 tablet (20 mg total) by mouth daily. Patient taking differently: Take 20 mg by mouth daily as needed for indigestion.  02/04/16   Ozella Almond Ward, PA-C  phenazopyridine (PYRIDIUM) 200 MG tablet Take 1 tablet (200 mg total) by mouth 3 (three) times daily. 08/02/16   [name], PA-C  vitamin C (ASCORBIC ACID) 500 MG tablet Take 500 mg by mouth daily.    Historical Provider, MD  zolpidem (AMBIEN) 10 MG tablet Take 10 mg by mouth at bedtime. 05/06/16   Historical Provider, MD  zolpidem (AMBIEN) 5 MG tablet 5mg  by mouth at bedtime as needed for sleep Patient not taking: Reported on 05/25/2016 05/15/16   Valinda Party, DO    Family History Family History  Problem Relation Age of Onset  . Cancer Father     Colon  . Coronary artery  disease    . Heart attack Mother     Social History Social History  Substance Use Topics  . Smoking status: Current Every Day Smoker    Packs/day: 0.50    Years: 10.00    Types: Cigarettes  . Smokeless tobacco: Never Used     Comment: smokes 6-7 cigarettes per day  . Alcohol use No     Allergies   Bee venom; Doxycycline; Erythromycin; Ibuprofen; Morphine and related; Penicillins; Sulfa antibiotics; Cephalexin; Dust mite extract; Tramadol; Other; and Sulfonamide derivatives   Review of Systems  Review of Systems  10 systems reviewed and found to be negative, except as noted in the HPI.   Physical Exam Updated Vital Signs BP 113/74 (BP Location: Left Arm)   Pulse 96   Temp 98.2 F (36.8 C) (Oral)   Resp 20   Wt 80.7 kg   SpO2 94%   BMI 29.60 kg/m   Physical Exam  Constitutional: She is oriented to person, place, and time. She appears well-developed and well-nourished. No distress.  HENT:  Head: Normocephalic and atraumatic.  Mouth/Throat: Oropharynx is clear and moist.  Eyes: Conjunctivae and EOM are normal. Pupils are equal, round, and reactive to light.  Neck: Normal range of motion.  Cardiovascular: Normal rate, regular rhythm and intact distal pulses.   Pulmonary/Chest: Effort normal and breath sounds normal.  Abdominal: Soft. There is no tenderness.  Musculoskeletal: Normal range of motion. She exhibits edema and tenderness.  Left hand with puncture wound on the thenar and hyperthenar eminence. Clean dry and intact, trace surrounding erythema, moderate swelling as pictured, full active range of motion and distally neurovascular intact.  Neurological: She is alert and oriented to person, place, and time.  Skin: She is not diaphoretic.  Psychiatric: She has a normal mood and affect.  Nursing note and vitals reviewed.        ED Treatments / Results  Labs (all labs ordered are listed, but only abnormal results are displayed) Labs Reviewed - No data to  display  EKG  EKG Interpretation None       Radiology No results found.  Procedures Procedures (including critical care time)  Medications Ordered in ED Medications  Tdap (BOOSTRIX) injection 0.5 mL (not administered)  lidocaine-EPINEPHrine-tetracaine (LET) solution (not administered)  rabies vaccine (RABAVERT) injection 1 mL (not administered)  doxycycline (VIBRA-TABS) tablet 100 mg (not administered)  metroNIDAZOLE (FLAGYL) tablet 500 mg (not administered)  ondansetron (ZOFRAN-ODT) disintegrating tablet 4 mg (not administered)  rabies immune globulin (HYPERAB) injection 1,650 Units (not administered)     Initial Impression / Assessment and Plan / ED Course  I have reviewed the triage vital signs and the nursing notes.  Pertinent labs & imaging results that were available during my care of the patient were reviewed by me and considered in my medical decision making (see chart for details).  Clinical Course     Vitals:   08/25/16 1510 08/25/16 1649  BP: 113/74   Pulse: 96   Resp: 20   Temp: 98.2 F (36.8 C)   TempSrc: Oral   SpO2: 94%   Weight:  80.7 kg    Medications  Tdap (BOOSTRIX) injection 0.5 mL (0.5 mLs Intramuscular Given 08/25/16 1739)  lidocaine-EPINEPHrine-tetracaine (LET) solution (3 mLs Topical Given 08/25/16 1737)  rabies vaccine (RABAVERT) injection 1 mL (1 mL Intramuscular Given 08/25/16 1737)  doxycycline (VIBRA-TABS) tablet 100 mg (100 mg Oral Given 08/25/16 1735)  metroNIDAZOLE (FLAGYL) tablet 500 mg (500 mg Oral Given 08/25/16 1735)  ondansetron (ZOFRAN-ODT) disintegrating tablet 4 mg (4 mg Oral Given 08/25/16 1735)  rabies immune globulin (HYPERAB) injection 1,650 Units (1,650 Units Infiltration Given 08/25/16 1837)    CHARNISE STEPNEY is 50 y.o. female presenting with Unknown dog bite to nondominant hand which she sustained yesterday, has significant pain and swelling today, unclear if this is infectious versus reactive from the trauma.  X-ray negative. Patient started on rabies vaccine series, tetanus updated, IgG is administered both into the wound into the gluteus. I've advised to return to the emergency department for recheck in 48  hours. She understands that she needs to go to the urgent care center for the 80s booster shots. She has an extensive list of allergies, I discussed this with Ovid Curd who is the infectious disease pharmacists, we have ordered with doxycycline (the allergy is just nausea) and Flagyl.  Evaluation does not show pathology that would require ongoing emergent intervention or inpatient treatment. Pt is hemodynamically stable and mentating appropriately. Discussed findings and plan with patient/guardian, who agrees with care plan. All questions answered. Return precautions discussed and outpatient follow up given.      Final Clinical Impressions(s) / ED Diagnoses   Final diagnoses:  None    New Prescriptions New Prescriptions   No medications on file     Monico Blitz, PA-C 08/25/16 1947    Tanna Furry, MD 09/01/16 [phone]

## 2016-08-25 NOTE — ED Triage Notes (Signed)
Yesterday a dog attacked her and bit her left hand, she has 2 puncture marks to left hand and swelling. Reports fever last night.

## 2016-08-28 ENCOUNTER — Emergency Department (HOSPITAL_COMMUNITY)
Admission: EM | Admit: 2016-08-28 | Discharge: 2016-08-28 | Disposition: A | Discharge status: Home / Self Care | Payer: Medicare Other | Attending: Emergency Medicine | Admitting: Emergency Medicine

## 2016-08-28 ENCOUNTER — Encounter (HOSPITAL_COMMUNITY): Payer: Self-pay | Admitting: Emergency Medicine

## 2016-08-28 DIAGNOSIS — Z7982 Long term (current) use of aspirin: Secondary | ICD-10-CM | POA: Diagnosis not present

## 2016-08-28 DIAGNOSIS — F17210 Nicotine dependence, cigarettes, uncomplicated: Secondary | ICD-10-CM | POA: Diagnosis not present

## 2016-08-28 DIAGNOSIS — E039 Hypothyroidism, unspecified: Secondary | ICD-10-CM | POA: Diagnosis not present

## 2016-08-28 DIAGNOSIS — R112 Nausea with vomiting, unspecified: Secondary | ICD-10-CM | POA: Insufficient documentation

## 2016-08-28 DIAGNOSIS — I2510 Atherosclerotic heart disease of native coronary artery without angina pectoris: Secondary | ICD-10-CM | POA: Insufficient documentation

## 2016-08-28 DIAGNOSIS — R1110 Vomiting, unspecified: Secondary | ICD-10-CM | POA: Diagnosis present

## 2016-08-28 DIAGNOSIS — I110 Hypertensive heart disease with heart failure: Secondary | ICD-10-CM | POA: Diagnosis not present

## 2016-08-28 DIAGNOSIS — Z203 Contact with and (suspected) exposure to rabies: Secondary | ICD-10-CM | POA: Diagnosis not present

## 2016-08-28 DIAGNOSIS — Z79899 Other long term (current) drug therapy: Secondary | ICD-10-CM | POA: Insufficient documentation

## 2016-08-28 DIAGNOSIS — I509 Heart failure, unspecified: Secondary | ICD-10-CM | POA: Insufficient documentation

## 2016-08-28 LAB — CBC WITH DIFFERENTIAL/PLATELET
BASOS ABS: 0.1 10*3/uL (ref 0.0–0.1)
BASOS PCT: 1 %
Eosinophils Absolute: 0.3 10*3/uL (ref 0.0–0.7)
Eosinophils Relative: 3 %
HEMATOCRIT: 39.7 % (ref 36.0–46.0)
HEMOGLOBIN: 13.3 g/dL (ref 12.0–15.0)
Lymphocytes Relative: 41 %
Lymphs Abs: 4.3 10*3/uL — ABNORMAL HIGH (ref 0.7–4.0)
MCH: 28.3 pg (ref 26.0–34.0)
MCHC: 33.5 g/dL (ref 30.0–36.0)
MCV: 84.5 fL (ref 78.0–100.0)
MONO ABS: 0.7 10*3/uL (ref 0.1–1.0)
Monocytes Relative: 6 %
NEUTROS ABS: 5.2 10*3/uL (ref 1.7–7.7)
NEUTROS PCT: 49 %
Platelets: 390 10*3/uL (ref 150–400)
RBC: 4.7 MIL/uL (ref 3.87–5.11)
RDW: 14.3 % (ref 11.5–15.5)
WBC: 10.6 10*3/uL — ABNORMAL HIGH (ref 4.0–10.5)

## 2016-08-28 LAB — COMPREHENSIVE METABOLIC PANEL
ALBUMIN: 3.4 g/dL — AB (ref 3.5–5.0)
ALK PHOS: 96 U/L (ref 38–126)
ALT: 27 U/L (ref 14–54)
AST: 24 U/L (ref 15–41)
Anion gap: 9 (ref 5–15)
BILIRUBIN TOTAL: 0.2 mg/dL — AB (ref 0.3–1.2)
BUN: 10 mg/dL (ref 6–20)
CO2: 30 mmol/L (ref 22–32)
CREATININE: 0.92 mg/dL (ref 0.44–1.00)
Calcium: 9.1 mg/dL (ref 8.9–10.3)
Chloride: 100 mmol/L — ABNORMAL LOW (ref 101–111)
GFR calc Af Amer: >60 mL/min (ref >60)
GLUCOSE: 111 mg/dL — AB (ref 65–99)
Potassium: 2.8 mmol/L — ABNORMAL LOW (ref 3.5–5.1)
Sodium: 139 mmol/L (ref 135–145)
TOTAL PROTEIN: 6.9 g/dL (ref 6.5–8.1)

## 2016-08-28 LAB — URINALYSIS, ROUTINE W REFLEX MICROSCOPIC
Bilirubin Urine: NEGATIVE
GLUCOSE, UA: NEGATIVE mg/dL
Hgb urine dipstick: NEGATIVE
KETONES UR: NEGATIVE mg/dL
Nitrite: NEGATIVE
PH: 6 (ref 5.0–8.0)
PROTEIN: NEGATIVE mg/dL
Specific Gravity, Urine: 1.009 (ref 1.005–1.030)

## 2016-08-28 LAB — I-STAT TROPONIN, ED
TROPONIN I, POC: 0 ng/mL (ref 0.00–0.08)
TROPONIN I, POC: 0 ng/mL (ref 0.00–0.08)

## 2016-08-28 LAB — LIPASE, BLOOD: LIPASE: 17 U/L (ref 11–51)

## 2016-08-28 LAB — RAPID URINE DRUG SCREEN, HOSP PERFORMED
Amphetamines: NOT DETECTED
BARBITURATES: NOT DETECTED
BENZODIAZEPINES: POSITIVE — AB
COCAINE: NOT DETECTED
Opiates: POSITIVE — AB
Tetrahydrocannabinol: NOT DETECTED

## 2016-08-28 LAB — I-STAT BETA HCG BLOOD, ED (MC, WL, AP ONLY): I-stat hCG, quantitative: <5 m[IU]/mL (ref <5)

## 2016-08-28 MED ORDER — HYDROMORPHONE HCL 2 MG/ML IJ SOLN
1.0000 mg | Freq: Once | INTRAMUSCULAR | Status: AC
  Administered 2016-08-28: 1 mg via INTRAVENOUS
  Filled 2016-08-28: qty 1

## 2016-08-28 MED ORDER — POTASSIUM CHLORIDE CRYS ER 20 MEQ PO TBCR
40.0000 meq | EXTENDED_RELEASE_TABLET | Freq: Once | ORAL | Status: AC
  Administered 2016-08-28: 40 meq via ORAL
  Filled 2016-08-28: qty 2

## 2016-08-28 MED ORDER — PROMETHAZINE HCL 25 MG PO TABS: 12.5000 mg | ORAL_TABLET | Freq: Four times a day (QID) | ORAL | 0 refills | Status: DC | PRN

## 2016-08-28 MED ORDER — SODIUM CHLORIDE 0.9 % IV BOLUS (SEPSIS)
1000.0000 mL | Freq: Once | INTRAVENOUS | Status: AC
  Administered 2016-08-28: 1000 mL via INTRAVENOUS

## 2016-08-28 MED ORDER — ONDANSETRON HCL 4 MG/2ML IJ SOLN
4.0000 mg | Freq: Once | INTRAMUSCULAR | Status: AC
  Administered 2016-08-28: 4 mg via INTRAVENOUS
  Filled 2016-08-28: qty 2

## 2016-08-28 MED ORDER — RABIES VACCINE, PCEC IM SUSR
1.0000 mL | Freq: Once | INTRAMUSCULAR | Status: AC
  Administered 2016-08-28: 1 mL via INTRAMUSCULAR
  Filled 2016-08-28: qty 1

## 2016-08-28 NOTE — ED Provider Notes (Signed)
[place] DEPT Provider Note   CSN: [id number] Arrival date & time: 08/28/16  0747     History   Chief Complaint Chief Complaint  Patient presents with  . Emesis  . Rabies Injection    HPI [name] is a 50 y.o. female.The past medical history adrenal insufficiency, gastroparesis, recent dog bite being treated with Flagyl and doxycycline. Patient states that she's had some stomach upset and last night began having profuse vomiting. She states that she is unsure if this is because of the antibiotics and because it's made her gastroparesis flare up. The patient is also in need of her rabies vaccine today. She has been unable to take any of her normal home medications due to vomiting. She denies abdominal pain. She denies any chest pain or shortness of breath. She has history of QT prolongation. However, EKG shows no abnormalities and her QT today.  HPI  Past Medical History:  Diagnosis Date  . Adrenal insufficiency ([place])    diagnosed 2012  . Aneurysm ([place])   . Anxiety   . Astigmatism   . CAD (coronary artery disease)    Cath 2008 EF normal. RCA 50-60, Septal 50%. Myoview 3/12: EF 53% normal perfusion  . Cardiac arrest ([place])    2/2 adissonian crisis  . Cardiomyopathy    resolved  . Chest pain    chronicc  . CHF (congestive heart failure) ([place])   . Chronic back pain   . Chronic diarrhea   . Concussion    sept 28th 2014  . Gastroparesis   . HTN (hypertension)   . Hyperlipidemia   . Hypothyroidism   . Mitral valve prolapse   . Nondiabetic gastroparesis   . QT prolongation   . Tobacco abuse    down to 2 cigarettes per day    Patient Active Problem List   Diagnosis Date Noted  . Myositis 05/11/2016  . Anxiety 05/26/2015  . Tremors of nervous system 05/26/2015  . Neck pain 05/26/2015  . Hot flushes, perimenopausal 07/24/2013  . CAD (coronary artery disease) 04/11/2012  . GERD (gastroesophageal reflux disease) 04/11/2012  . Addison's disease ([place])  03/03/2012  . QT prolongation 12/15/2010  . Palpitations 01/01/2009  . Hypothyroidism 12/31/2008  . Hyperlipidemia 12/31/2008  . OVERWEIGHT/OBESITY 12/31/2008  . Essential hypertension 12/31/2008    Past Surgical History:  Procedure Laterality Date  . ABDOMINAL HYSTERECTOMY    . CARDIAC CATHETERIZATION  03/2007   showed 60% lesion in the right coronary artery  . CHOLECYSTECTOMY    . LEFT HEART CATHETERIZATION WITH CORONARY ANGIOGRAM N/A 04/13/2012   Procedure: LEFT HEART CATHETERIZATION WITH CORONARY ANGIOGRAM;  Surgeon: Hillary Bow, MD;  Location: [facility] CATH LAB;  Service: Cardiovascular;  Laterality: N/A;  . SPINE SURGERY    . VARICOSE VEIN SURGERY    . VESICOVAGINAL FISTULA CLOSURE W/ TAH      OB History    Gravida Para Term Preterm AB Living   3 2 2  0 1 2   SAB TAB Ectopic Multiple Live Births   1               Home Medications    Prior to Admission medications   Medication Sig Start Date End Date Taking? Authorizing Provider  amLODipine (NORVASC) 5 MG tablet TAKE 1 TABLET (5 MG TOTAL) BY MOUTH DAILY. 07/25/15  Yes Pixie Casino, MD  aspirin 81 MG chewable tablet Chew 81 mg by mouth daily.    Yes Historical Provider, MD  cholecalciferol (VITAMIN  D) 1000 UNITS tablet Take 1,000 Units by mouth daily.   Yes Historical Provider, MD  cyclobenzaprine (FLEXERIL) 5 MG tablet Take 2 tablets (10 mg total) by mouth 3 (three) times daily as needed for muscle spasms. 05/15/16  Yes Jessica Ratliff Hoffman, DO  doxycycline (VIBRAMYCIN) 100 MG capsule Take 1 capsule (100 mg total) by mouth 2 (two) times daily. 08/25/16  Yes Nicole Pisciotta, PA-C  hydrochlorothiazide (HYDRODIURIL) 12.5 MG tablet Take 6.25 mg by mouth daily as needed (for High Blood Pressure >145/98). Second line medication.   Yes Historical Provider, MD  hydrocortisone (CORTEF) 10 MG tablet Take 10-20 mg by mouth 2 (two) times daily. Takes 20 mg in the morning and 10 mg at night 04/07/16  Yes Historical Provider, MD    levothyroxine (SYNTHROID, LEVOTHROID) 75 MCG tablet Take 75 mcg by mouth daily before breakfast.   Yes Historical Provider, MD  LORazepam (ATIVAN) 2 MG tablet Take 2 mg by mouth 4 (four) times daily as needed for anxiety. 05/06/16  Yes Historical Provider, MD  meclizine (ANTIVERT) 25 MG tablet 1 or 2 tabs PO q8h prn dizziness Patient taking differently: Take 25-50 mg by mouth 3 (three) times daily as needed (for vertigo).  04/16/16  Yes Francine Graven, DO  metoCLOPramide (REGLAN) 10 MG tablet Take 10 mg by mouth 3 (three) times daily before meals.  06/21/16  Yes Historical Provider, MD  metroNIDAZOLE (FLAGYL) 500 MG tablet Take 1 tablet (500 mg total) by mouth 3 (three) times daily. One tab PO bid x 14 days 08/25/16 09/04/16 Yes Nicole Pisciotta, PA-C  nitroGLYCERIN (NITROSTAT) 0.4 MG SL tablet Place 0.4 mg under the tongue every 5 (five) minutes as needed for chest pain.   Yes Historical Provider, MD  ondansetron (ZOFRAN ODT) 4 MG disintegrating tablet Take 1 tablet (4 mg total) by mouth every 8 (eight) hours as needed for nausea or vomiting. 02/04/16  Yes Ozella Almond Ward, PA-C  oxycodone (ROXICODONE) 15 MG immediate release tablet Take 2 tablets (30 mg total) by mouth every 4 (four) hours as needed for pain. 05/15/16  Yes Jessica Ratliff Hoffman, DO  pantoprazole (PROTONIX) 20 MG tablet Take 1 tablet (20 mg total) by mouth daily. Patient taking differently: Take 20 mg by mouth daily as needed for indigestion.  02/04/16  Yes Jaime Pilcher Ward, PA-C  vitamin C (ASCORBIC ACID) 500 MG tablet Take 500 mg by mouth daily.   Yes Historical Provider, MD  zolpidem (AMBIEN) 10 MG tablet Take 10 mg by mouth at bedtime. 05/06/16  Yes Historical Provider, MD  HYDROcodone-acetaminophen (NORCO/VICODIN) 5-325 MG tablet Take 1-2 tablets by mouth every 6 hours as needed for pain and/or cough. Patient not taking: Reported on 08/28/2016 08/25/16   Elmyra Ricks Pisciotta, PA-C  ondansetron (ZOFRAN) 4 MG tablet Take 1 tablet  (4 mg total) by mouth every 8 (eight) hours as needed for nausea or vomiting. Patient not taking: Reported on 08/28/2016 08/25/16   Elmyra Ricks Pisciotta, PA-C  zolpidem (AMBIEN) 5 MG tablet 5mg  by mouth at bedtime as needed for sleep Patient not taking: Reported on 08/28/2016 05/15/16   Valinda Party, DO    Family History Family History  Problem Relation Age of Onset  . Cancer Father     Colon  . Coronary artery disease    . Heart attack Mother     Social History Social History  Substance Use Topics  . Smoking status: Current Every Day Smoker    Packs/day: 0.50    Years: 10.00  Types: Cigarettes  . Smokeless tobacco: Never Used     Comment: smokes 6-7 cigarettes per day  . Alcohol use No     Allergies   Bee venom; Doxycycline; Erythromycin; Ibuprofen; Morphine and related; Penicillins; Sulfa antibiotics; Cephalexin; Dust mite extract; Tramadol; Other; and Sulfonamide derivatives   Review of Systems Review of Systems  Ten systems reviewed and are negative for acute change, except as noted in the HPI.   Physical Exam Updated Vital Signs BP 131/78   Pulse 74   Temp 98.4 F (36.9 C) (Oral)   Resp 19   Ht 5' 5.5" (1.664 m)   Wt 79.4 kg   SpO2 99%   BMI 28.68 kg/m   Physical Exam  Constitutional: She is oriented to person, place, and time. She appears well-developed and well-nourished. No distress.  HENT:  Head: Normocephalic and atraumatic.  Eyes: Conjunctivae are normal. No scleral icterus.  Neck: Normal range of motion.  Cardiovascular: Normal rate, regular rhythm and normal heart sounds.  Exam reveals no gallop and no friction rub.   No murmur heard. Pulmonary/Chest: Effort normal and breath sounds normal. No respiratory distress.  Abdominal: Soft. Bowel sounds are normal. She exhibits no distension and no mass. There is no tenderness. There is no guarding.  Neurological: She is alert and oriented to person, place, and time.  Skin: Skin is warm and  dry. She is not diaphoretic.  Vitals reviewed.    ED Treatments / Results  Labs (all labs ordered are listed, but only abnormal results are displayed) Labs Reviewed  CBC WITH DIFFERENTIAL/PLATELET - Abnormal; Notable for the following:       Result Value   WBC 10.6 (*)    Lymphs Abs 4.3 (*)    All other components within normal limits  COMPREHENSIVE METABOLIC PANEL - Abnormal; Notable for the following:    Potassium 2.8 (*)    Chloride 100 (*)    Glucose, Bld 111 (*)    Albumin 3.4 (*)    Total Bilirubin 0.2 (*)    All other components within normal limits  LIPASE, BLOOD  URINALYSIS, ROUTINE W REFLEX MICROSCOPIC  RAPID URINE DRUG SCREEN, HOSP PERFORMED  I-STAT BETA HCG BLOOD, ED ([facility], [facility], [facility] ONLY)  I-STAT TROPOININ, ED    EKG  EKG Interpretation  Date/Time:  Saturday August 28 2016 09:18:54 EST Ventricular Rate:  77 PR Interval:    QRS Duration: 102 QT Interval:  415 QTC Calculation: 470 R Axis:   90 Text Interpretation:  Sinus rhythm Borderline right axis deviation Minimal ST depression, inferior leads Abnormal ekg Confirmed by Carmin Muskrat  MD ([id number]) on 08/28/2016 9:33:57 AM       Radiology No results found.  Procedures Procedures (including critical care time)  Medications Ordered in ED Medications  rabies vaccine (RABAVERT) injection 1 mL (1 mL Intramuscular Given 08/28/16 0933)     Initial Impression / Assessment and Plan / ED Course  I have reviewed the triage vital signs and the nursing notes.  Pertinent labs & imaging results that were available during my care of the patient were reviewed by me and considered in my medical decision making (see chart for details).  Clinical Course as of Aug 29 1649  Sat Aug 28, 2016  0815 Cardiac Cath on 08/27/2015 showed normal coronary arteries  [AH]    Clinical Course User Index [AH] Margarita Mail, PA-C    Patient with negative troponins. Her urine appears contaminated. She has no repeat abdominal  pain here,  no nausea or vomiting. She is able to hold down fluids. Patient be discharged. With her PCP.  Final Clinical Impressions(s) / ED Diagnoses   Final diagnoses:  Non-intractable vomiting with nausea, unspecified vomiting type    New Prescriptions New Prescriptions   No medications on file     Margarita Mail, PA-C 08/29/16 1652    Carmin Muskrat, MD 09/01/16 0010

## 2016-08-28 NOTE — ED Triage Notes (Signed)
PT has a short arm cast to Rt arm place on NOV. 20 from a fall in bathroom.

## 2016-08-28 NOTE — Discharge Instructions (Signed)
Get help right away if: °You have pain in your chest, neck, arm, or jaw. °You feel extremely weak or you faint. °You have persistent vomiting. °You see blood in your vomit. °Your vomit looks like black coffee grounds. °You have bloody or black stools or stools that look like tar. °You have a severe headache, a stiff neck, or both. °You have a rash. °You have severe pain, cramping, or bloating in your abdomen. °You have trouble breathing or you are breathing very quickly. °Your heart is beating very quickly. °Your skin feels cold and clammy. °You feel confused. °You have pain when you urinate. °You have signs of dehydration, such as: °Dark urine, very little urine, or no urine. °Cracked lips. °Dry mouth. °Sunken eyes. °Sleepiness. °Weakness. °

## 2016-08-28 NOTE — ED Notes (Signed)
Patient reporting to this RN concerns about being able to avoid the rest of her rabies injections at the outpatient clinic, Patient advised she may follow up here in the ED to get the rest of her needed injections if she needs to.

## 2016-08-28 NOTE — ED Notes (Signed)
Pt wheeled to restroom, assisted into wheelchair and back into bed.

## 2016-08-28 NOTE — ED Notes (Addendum)
Note made in error

## 2016-08-28 NOTE — ED Notes (Signed)
Family at bedside. 

## 2016-08-28 NOTE — ED Triage Notes (Signed)
Pt reports here for follow up of dog bite that occurred on the 19th and for rabies shot. Pt reports medication prescribed to her is causing nausea and she has not taken any medication today.

## 2016-08-28 NOTE — ED Triage Notes (Addendum)
PT reports vomiting 8 times since 3AM today due to her gastorparesis. Pt reports she has not been able to take daily meds for 2 days.

## 2016-09-02 ENCOUNTER — Emergency Department (HOSPITAL_COMMUNITY)
Admission: EM | Admit: 2016-09-02 | Discharge: 2016-09-02 | Disposition: A | Discharge status: Home / Self Care | Payer: Medicare Other | Attending: Emergency Medicine | Admitting: Emergency Medicine

## 2016-09-02 ENCOUNTER — Encounter (HOSPITAL_COMMUNITY): Payer: Self-pay | Admitting: Emergency Medicine

## 2016-09-02 DIAGNOSIS — F17210 Nicotine dependence, cigarettes, uncomplicated: Secondary | ICD-10-CM | POA: Diagnosis not present

## 2016-09-02 DIAGNOSIS — E039 Hypothyroidism, unspecified: Secondary | ICD-10-CM | POA: Diagnosis not present

## 2016-09-02 DIAGNOSIS — Y999 Unspecified external cause status: Secondary | ICD-10-CM | POA: Diagnosis not present

## 2016-09-02 DIAGNOSIS — Z7982 Long term (current) use of aspirin: Secondary | ICD-10-CM | POA: Insufficient documentation

## 2016-09-02 DIAGNOSIS — W540XXA Bitten by dog, initial encounter: Secondary | ICD-10-CM | POA: Insufficient documentation

## 2016-09-02 DIAGNOSIS — Z23 Encounter for immunization: Secondary | ICD-10-CM

## 2016-09-02 DIAGNOSIS — Z79899 Other long term (current) drug therapy: Secondary | ICD-10-CM | POA: Diagnosis not present

## 2016-09-02 DIAGNOSIS — Y939 Activity, unspecified: Secondary | ICD-10-CM | POA: Insufficient documentation

## 2016-09-02 DIAGNOSIS — Z203 Contact with and (suspected) exposure to rabies: Secondary | ICD-10-CM | POA: Insufficient documentation

## 2016-09-02 DIAGNOSIS — Y929 Unspecified place or not applicable: Secondary | ICD-10-CM | POA: Diagnosis not present

## 2016-09-02 DIAGNOSIS — I2510 Atherosclerotic heart disease of native coronary artery without angina pectoris: Secondary | ICD-10-CM | POA: Diagnosis not present

## 2016-09-02 DIAGNOSIS — I110 Hypertensive heart disease with heart failure: Secondary | ICD-10-CM | POA: Insufficient documentation

## 2016-09-02 DIAGNOSIS — I509 Heart failure, unspecified: Secondary | ICD-10-CM | POA: Diagnosis not present

## 2016-09-02 MED ORDER — RABIES VACCINE, PCEC IM SUSR
1.0000 mL | Freq: Once | INTRAMUSCULAR | Status: AC
  Administered 2016-09-02: 1 mL via INTRAMUSCULAR
  Filled 2016-09-02: qty 1

## 2016-09-02 NOTE — ED Notes (Signed)
Pt departed in NAD.  

## 2016-09-02 NOTE — ED Triage Notes (Signed)
Pt here for second rabies vaccine. 

## 2016-09-02 NOTE — Discharge Instructions (Signed)
Next dose is due on 09/05/16.

## 2016-09-02 NOTE — ED Provider Notes (Signed)
[place] DEPT Provider Note   CSN: [id number] Arrival date & time: 09/02/16  1627  By signing my name below, I, [name], attest that this documentation has been prepared under the direction and in the presence of Joy Quill, NP-C. Electronically Signed: Norris Larson , ED Scribe. 09/02/16. 6:31 PM.    History   Chief Complaint Chief Complaint  Patient presents with  . Rabies Injection    HPI HPI Comments: [name] is a 50 y.o. female who presents to the Emergency Department for rabies injections s/p dog bite X9 days ago. Pt states that she was bit by a dog on 12/19 and  this is her 3rd round of rabies injections. She reports clear drainage from the area and an associated fever. Pt denies chills.    The history is provided by the patient. No language interpreter was used.    Past Medical History:  Diagnosis Date  . Adrenal insufficiency ([place])    diagnosed 2012  . Aneurysm ([place])   . Anxiety   . Astigmatism   . CAD (coronary artery disease)    Cath 2008 EF normal. RCA 50-60, Septal 50%. Myoview 3/12: EF 53% normal perfusion  . Cardiac arrest ([place])    2/2 adissonian crisis  . Cardiomyopathy    resolved  . Chest pain    chronicc  . CHF (congestive heart failure) ([place])   . Chronic back pain   . Chronic diarrhea   . Concussion    sept 28th 2014  . Gastroparesis   . HTN (hypertension)   . Hyperlipidemia   . Hypothyroidism   . Mitral valve prolapse   . Nondiabetic gastroparesis   . QT prolongation   . Tobacco abuse    down to 2 cigarettes per day    Patient Active Problem List   Diagnosis Date Noted  . Myositis 05/11/2016  . Anxiety 05/26/2015  . Tremors of nervous system 05/26/2015  . Neck pain 05/26/2015  . Hot flushes, perimenopausal 07/24/2013  . CAD (coronary artery disease) 04/11/2012  . GERD (gastroesophageal reflux disease) 04/11/2012  . Addison's disease ([place]) 03/03/2012  . QT prolongation 12/15/2010  . Palpitations  01/01/2009  . Hypothyroidism 12/31/2008  . Hyperlipidemia 12/31/2008  . OVERWEIGHT/OBESITY 12/31/2008  . Essential hypertension 12/31/2008    Past Surgical History:  Procedure Laterality Date  . ABDOMINAL HYSTERECTOMY    . CARDIAC CATHETERIZATION  03/2007   showed 60% lesion in the right coronary artery  . CHOLECYSTECTOMY    . LEFT HEART CATHETERIZATION WITH CORONARY ANGIOGRAM N/A 04/13/2012   Procedure: LEFT HEART CATHETERIZATION WITH CORONARY ANGIOGRAM;  Surgeon: Hillary Bow, MD;  Location: [facility] CATH LAB;  Service: Cardiovascular;  Laterality: N/A;  . SPINE SURGERY    . VARICOSE VEIN SURGERY    . VESICOVAGINAL FISTULA CLOSURE W/ TAH      OB History    Gravida Para Term Preterm AB Living   3 2 2  0 1 2   SAB TAB Ectopic Multiple Live Births   1               Home Medications    Prior to Admission medications   Medication Sig Start Date End Date Taking? Authorizing Provider  amLODipine (NORVASC) 5 MG tablet TAKE 1 TABLET (5 MG TOTAL) BY MOUTH DAILY. 07/25/15   Pixie Casino, MD  aspirin 81 MG chewable tablet Chew 81 mg by mouth daily.     Historical Provider, MD  cholecalciferol (VITAMIN D) 1000 UNITS tablet Take  1,000 Units by mouth daily.    Historical Provider, MD  cyclobenzaprine (FLEXERIL) 5 MG tablet Take 2 tablets (10 mg total) by mouth 3 (three) times daily as needed for muscle spasms. 05/15/16   Valinda Party, DO  doxycycline (VIBRAMYCIN) 100 MG capsule Take 1 capsule (100 mg total) by mouth 2 (two) times daily. 08/25/16   Nicole Pisciotta, PA-C  hydrochlorothiazide (HYDRODIURIL) 12.5 MG tablet Take 6.25 mg by mouth daily as needed (for High Blood Pressure >145/98). Second line medication.    Historical Provider, MD  HYDROcodone-acetaminophen (NORCO/VICODIN) 5-325 MG tablet Take 1-2 tablets by mouth every 6 hours as needed for pain and/or cough. Patient not taking: Reported on 08/28/2016 08/25/16   Elmyra Ricks Pisciotta, PA-C  hydrocortisone (CORTEF) 10 MG  tablet Take 10-20 mg by mouth 2 (two) times daily. Takes 20 mg in the morning and 10 mg at night 04/07/16   Historical Provider, MD  levothyroxine (SYNTHROID, LEVOTHROID) 75 MCG tablet Take 75 mcg by mouth daily before breakfast.    Historical Provider, MD  LORazepam (ATIVAN) 2 MG tablet Take 2 mg by mouth 4 (four) times daily as needed for anxiety. 05/06/16   Historical Provider, MD  meclizine (ANTIVERT) 25 MG tablet 1 or 2 tabs PO q8h prn dizziness Patient taking differently: Take 25-50 mg by mouth 3 (three) times daily as needed (for vertigo).  04/16/16   Francine Graven, DO  metoCLOPramide (REGLAN) 10 MG tablet Take 10 mg by mouth 3 (three) times daily before meals.  06/21/16   Historical Provider, MD  metroNIDAZOLE (FLAGYL) 500 MG tablet Take 1 tablet (500 mg total) by mouth 3 (three) times daily. One tab PO bid x 14 days 08/25/16 09/04/16  Elmyra Ricks Pisciotta, PA-C  nitroGLYCERIN (NITROSTAT) 0.4 MG SL tablet Place 0.4 mg under the tongue every 5 (five) minutes as needed for chest pain.    Historical Provider, MD  ondansetron (ZOFRAN ODT) 4 MG disintegrating tablet Take 1 tablet (4 mg total) by mouth every 8 (eight) hours as needed for nausea or vomiting. 02/04/16   Ozella Almond Ward, PA-C  ondansetron (ZOFRAN) 4 MG tablet Take 1 tablet (4 mg total) by mouth every 8 (eight) hours as needed for nausea or vomiting. Patient not taking: Reported on 08/28/2016 08/25/16   Elmyra Ricks Pisciotta, PA-C  oxycodone (ROXICODONE) 15 MG immediate release tablet Take 2 tablets (30 mg total) by mouth every 4 (four) hours as needed for pain. 05/15/16   Jessica Ratliff Hoffman, DO  pantoprazole (PROTONIX) 20 MG tablet Take 1 tablet (20 mg total) by mouth daily. Patient taking differently: Take 20 mg by mouth daily as needed for indigestion.  02/04/16   Jaime Pilcher Ward, PA-C  promethazine (PHENERGAN) 25 MG tablet Take 0.5-1 tablets (12.5-25 mg total) by mouth every 6 (six) hours as needed for nausea or vomiting. 08/28/16    Margarita Mail, PA-C  vitamin C (ASCORBIC ACID) 500 MG tablet Take 500 mg by mouth daily.    Historical Provider, MD  zolpidem (AMBIEN) 10 MG tablet Take 10 mg by mouth at bedtime. 05/06/16   Historical Provider, MD  zolpidem (AMBIEN) 5 MG tablet 5mg  by mouth at bedtime as needed for sleep Patient not taking: Reported on 08/28/2016 05/15/16   Valinda Party, DO    Family History Family History  Problem Relation Age of Onset  . Cancer Father     Colon  . Coronary artery disease    . Heart attack Mother     Social History Social History  Substance Use Topics  . Smoking status: Current Every Day Smoker    Packs/day: 0.50    Years: 10.00    Types: Cigarettes  . Smokeless tobacco: Never Used     Comment: smokes 6-7 cigarettes per day  . Alcohol use No     Allergies   Bee venom; Doxycycline; Erythromycin; Ibuprofen; Morphine and related; Penicillins; Sulfa antibiotics; Cephalexin; Dust mite extract; Tramadol; Other; and Sulfonamide derivatives   Review of Systems Review of Systems  Constitutional: Positive for fever. Negative for chills.  HENT: Negative for ear pain and sore throat.   Eyes: Negative for pain and visual disturbance.  Respiratory: Negative for cough and shortness of breath.   Cardiovascular: Negative for chest pain and palpitations.  Gastrointestinal: Negative for abdominal pain and vomiting.  Genitourinary: Negative for dysuria and hematuria.  Musculoskeletal: Negative for arthralgias and back pain.  Skin: Positive for wound (well-healing dog bite to L hand). Negative for color change and rash.  Neurological: Negative for seizures and syncope.  All other systems reviewed and are negative.    Physical Exam Updated Vital Signs BP 136/94 (BP Location: Right Arm)   Pulse 91   Temp 99.3 F (37.4 C) (Oral)   Resp 16   Ht 5\' 5"  (1.651 m)   Wt 175 lb (79.4 kg)   SpO2 96%   BMI 29.12 kg/m   Physical Exam  Constitutional: She appears well-developed  and well-nourished. No distress.  HENT:  Head: Normocephalic and atraumatic.  Eyes: Conjunctivae are normal.  Neck: Neck supple.  Cardiovascular: Normal rate and regular rhythm.   No murmur heard. Pulmonary/Chest: Effort normal and breath sounds normal. No respiratory distress.  Abdominal: Soft. There is no tenderness.  Musculoskeletal: She exhibits no edema.  Neurological: She is alert.  Skin: Skin is warm and dry.     Psychiatric: She has a normal mood and affect.  Nursing note and vitals reviewed.    ED Treatments / Results   DIAGNOSTIC STUDIES: Oxygen Saturation is 96% on RA, adequate by my interpretation.   COORDINATION OF CARE: 6:33 PM-Discussed next steps with pt. Pt verbalized understanding and is agreeable with the plan.    Labs (all labs ordered are listed, but only abnormal results are displayed) Labs Reviewed - No data to display  EKG  EKG Interpretation None       Radiology No results found.  Procedures Procedures (including critical care time)  Medications Ordered in ED Medications  rabies vaccine (RABAVERT) injection 1 mL (not administered)     Initial Impression / Assessment and Plan / ED Course  I have reviewed the triage vital signs and the nursing notes.  Pertinent labs & imaging results that were available during my care of the patient were reviewed by me and considered in my medical decision making (see chart for details).  Clinical Course   Patient presents today for third in series of rabies prophylaxis.  Well healing wound of left hand, no sign of infection.    Final Clinical Impressions(s) / ED Diagnoses   Final diagnoses:  Need for rabies vaccination    New Prescriptions Discharge Medication List as of 09/02/2016  7:10 PM     I personally performed the services described in this documentation, which was scribed in my presence. The recorded information has been reviewed and is accurate.     Joy Quill, NP 09/13/16  [place], MD 09/14/16 2140

## 2016-09-05 ENCOUNTER — Encounter (HOSPITAL_COMMUNITY): Payer: Self-pay | Admitting: Emergency Medicine

## 2016-09-05 ENCOUNTER — Emergency Department (HOSPITAL_COMMUNITY): Payer: Medicare Other

## 2016-09-05 ENCOUNTER — Emergency Department (HOSPITAL_COMMUNITY)
Admission: EM | Admit: 2016-09-05 | Discharge: 2016-09-05 | Disposition: A | Discharge status: Home / Self Care | Payer: Medicare Other | Attending: Emergency Medicine | Admitting: Emergency Medicine

## 2016-09-05 DIAGNOSIS — I110 Hypertensive heart disease with heart failure: Secondary | ICD-10-CM | POA: Diagnosis not present

## 2016-09-05 DIAGNOSIS — F17210 Nicotine dependence, cigarettes, uncomplicated: Secondary | ICD-10-CM | POA: Diagnosis not present

## 2016-09-05 DIAGNOSIS — Z79899 Other long term (current) drug therapy: Secondary | ICD-10-CM | POA: Diagnosis not present

## 2016-09-05 DIAGNOSIS — R52 Pain, unspecified: Secondary | ICD-10-CM

## 2016-09-05 DIAGNOSIS — R110 Nausea: Secondary | ICD-10-CM | POA: Diagnosis not present

## 2016-09-05 DIAGNOSIS — R079 Chest pain, unspecified: Secondary | ICD-10-CM | POA: Insufficient documentation

## 2016-09-05 DIAGNOSIS — E039 Hypothyroidism, unspecified: Secondary | ICD-10-CM | POA: Diagnosis not present

## 2016-09-05 DIAGNOSIS — M25512 Pain in left shoulder: Secondary | ICD-10-CM | POA: Diagnosis not present

## 2016-09-05 DIAGNOSIS — Z7982 Long term (current) use of aspirin: Secondary | ICD-10-CM | POA: Diagnosis not present

## 2016-09-05 DIAGNOSIS — I509 Heart failure, unspecified: Secondary | ICD-10-CM | POA: Insufficient documentation

## 2016-09-05 DIAGNOSIS — R0789 Other chest pain: Secondary | ICD-10-CM | POA: Diagnosis not present

## 2016-09-05 DIAGNOSIS — R0602 Shortness of breath: Secondary | ICD-10-CM | POA: Diagnosis not present

## 2016-09-05 DIAGNOSIS — G8929 Other chronic pain: Secondary | ICD-10-CM | POA: Diagnosis not present

## 2016-09-05 DIAGNOSIS — I2510 Atherosclerotic heart disease of native coronary artery without angina pectoris: Secondary | ICD-10-CM | POA: Insufficient documentation

## 2016-09-05 LAB — CBC
HCT: 41.1 % (ref 36.0–46.0)
Hemoglobin: 13.8 g/dL (ref 12.0–15.0)
MCH: 28.3 pg (ref 26.0–34.0)
MCHC: 33.6 g/dL (ref 30.0–36.0)
MCV: 84.4 fL (ref 78.0–100.0)
PLATELETS: 426 10*3/uL — AB (ref 150–400)
RBC: 4.87 MIL/uL (ref 3.87–5.11)
RDW: 15 % (ref 11.5–15.5)
WBC: 11 10*3/uL — ABNORMAL HIGH (ref 4.0–10.5)

## 2016-09-05 LAB — HEPATIC FUNCTION PANEL
ALK PHOS: 84 U/L (ref 38–126)
ALT: 15 U/L (ref 14–54)
AST: 22 U/L (ref 15–41)
Albumin: 3.2 g/dL — ABNORMAL LOW (ref 3.5–5.0)
BILIRUBIN TOTAL: 0.3 mg/dL (ref 0.3–1.2)
Bilirubin, Direct: <0.1 mg/dL — ABNORMAL LOW (ref 0.1–0.5)
Total Protein: 6.9 g/dL (ref 6.5–8.1)

## 2016-09-05 LAB — BASIC METABOLIC PANEL
Anion gap: 10 (ref 5–15)
BUN: 17 mg/dL (ref 6–20)
CHLORIDE: 102 mmol/L (ref 101–111)
CO2: 25 mmol/L (ref 22–32)
CREATININE: 0.9 mg/dL (ref 0.44–1.00)
Calcium: 8.6 mg/dL — ABNORMAL LOW (ref 8.9–10.3)
GFR calc Af Amer: >60 mL/min (ref >60)
GFR calc non Af Amer: >60 mL/min (ref >60)
GLUCOSE: 119 mg/dL — AB (ref 65–99)
Potassium: 3.9 mmol/L (ref 3.5–5.1)
Sodium: 137 mmol/L (ref 135–145)

## 2016-09-05 LAB — I-STAT TROPONIN, ED
Troponin i, poc: 0 ng/mL (ref 0.00–0.08)
Troponin i, poc: 0 ng/mL (ref 0.00–0.08)

## 2016-09-05 LAB — LIPASE, BLOOD: LIPASE: 18 U/L (ref 11–51)

## 2016-09-05 MED ORDER — OXYCODONE HCL 5 MG PO TABS
30.0000 mg | ORAL_TABLET | Freq: Once | ORAL | Status: AC
  Administered 2016-09-05: 30 mg via ORAL
  Filled 2016-09-05: qty 6

## 2016-09-05 MED ORDER — SUCRALFATE 1 G PO TABS
1.0000 g | ORAL_TABLET | Freq: Once | ORAL | Status: AC
  Administered 2016-09-05: 1 g via ORAL
  Filled 2016-09-05: qty 1

## 2016-09-05 MED ORDER — RABIES VACCINE, PCEC IM SUSR: 1.0000 mL | Freq: Once | INTRAMUSCULAR | Status: DC

## 2016-09-05 MED ORDER — MECLIZINE HCL 25 MG PO TABS
50.0000 mg | ORAL_TABLET | Freq: Once | ORAL | Status: AC
  Administered 2016-09-05: 50 mg via ORAL
  Filled 2016-09-05: qty 2

## 2016-09-05 NOTE — Discharge Instructions (Signed)
Your lab work did not show any evidence of heart attack or acute heart issue today. Although you have risk factors, your cardiac catheterization in 2013 showed no obstructive artery disease in in your heart. You need to call tomorrow about your chest pain and you need to set up a cardiac stress test as soon as possible. Take your regular pain medication at home as well as all of your other medications. You may use meclizine for nausea and see your primary care doctor tomorrow.  Return for worsening symptoms, SOB.   Your caregiver has diagnosed you as having chest pain that is not specific for one problem, but does not require admission.  You are at low risk for an acute heart condition or other serious illness. Chest pain comes from many different causes.  SEEK IMMEDIATE MEDICAL ATTENTION IF: You have severe chest pain, especially if the pain is crushing or pressure-like and spreads to the arms, back, neck, or jaw, or if you have sweating, nausea (feeling sick to your stomach), or shortness of breath. THIS IS AN EMERGENCY. Don't wait to see if the pain will go away. Get medical help at once. Call 911 or 0 (operator). DO NOT drive yourself to the hospital.  Your chest pain gets worse and does not go away with rest.  You have an attack of chest pain lasting longer than usual, despite rest and treatment with the medications your caregiver has prescribed.  You wake from sleep with chest pain or shortness of breath.  You feel dizzy or faint.  You have chest pain not typical of your usual pain for which you originally saw your caregiver.

## 2016-09-05 NOTE — ED Triage Notes (Signed)
Pt. reports mid chest/epigastric pain radiating to left neck and left arm onset last night with SOB and mild nausea , pt.also requesting her last dose of her rabies vaccination .

## 2016-09-05 NOTE — ED Notes (Signed)
Pt A&OX4, ambulatory at d/c with steady gait, NAD 

## 2016-09-05 NOTE — ED Notes (Signed)
Patient transported to X-ray 

## 2016-09-05 NOTE — ED Provider Notes (Signed)
[facility] DEPT Provider Note   CSN: [id number] Arrival date & time: 09/05/16  0431     History   Chief Complaint Chief Complaint  Patient presents with  . Chest Pain    HPI [name] is a 50 y.o. female who is a daily smoker with a pmh of CAD, adrenal insufficiency, chronic pain (chest and back), gastroparesis, QT prolongation (with negative EKG today), and recent dog bite who presents to the ED with CC of CP. Patients last cardiac cath performed  04/13/2012 NO OBSTRUCTIVE CAD.  Patient complains of  Abdominal pain, retrosternal cp, nausea, Pain in the left shoulder blade. She took her normal dose of oxycodone at 10:00 PM last night without relief. She reports taking 2 NTG earlier today along with asa with a decrease in her pain. She has had persistent nausea since she began her course of abx for the dog bite. She denies vomiting. She has run out of her phenergan. She has no associated diaphoresis. She has had chills without fever. Pain is nonpleuritic and nonexertional.   HPI  Past Medical History:  Diagnosis Date  . Adrenal insufficiency ([place])    diagnosed 2012  . Aneurysm ([place])   . Anxiety   . Astigmatism   . CAD (coronary artery disease)    Cath 2008 EF normal. RCA 50-60, Septal 50%. Myoview 3/12: EF 53% normal perfusion  . Cardiac arrest ([place])    2/2 adissonian crisis  . Cardiomyopathy    resolved  . Chest pain    chronicc  . CHF (congestive heart failure) ([place])   . Chronic back pain   . Chronic diarrhea   . Concussion    sept 28th 2014  . Gastroparesis   . HTN (hypertension)   . Hyperlipidemia   . Hypothyroidism   . Mitral valve prolapse   . Nondiabetic gastroparesis   . QT prolongation   . Tobacco abuse    down to 2 cigarettes per day    Patient Active Problem List   Diagnosis Date Noted  . Myositis 05/11/2016  . Anxiety 05/26/2015  . Tremors of nervous system 05/26/2015  . Neck pain 05/26/2015  . Hot flushes, perimenopausal 07/24/2013  .  CAD (coronary artery disease) 04/11/2012  . GERD (gastroesophageal reflux disease) 04/11/2012  . Addison's disease ([place]) 03/03/2012  . QT prolongation 12/15/2010  . Palpitations 01/01/2009  . Hypothyroidism 12/31/2008  . Hyperlipidemia 12/31/2008  . OVERWEIGHT/OBESITY 12/31/2008  . Essential hypertension 12/31/2008    Past Surgical History:  Procedure Laterality Date  . ABDOMINAL HYSTERECTOMY    . CARDIAC CATHETERIZATION  03/2007   showed 60% lesion in the right coronary artery  . CHOLECYSTECTOMY    . LEFT HEART CATHETERIZATION WITH CORONARY ANGIOGRAM N/A 04/13/2012   Procedure: LEFT HEART CATHETERIZATION WITH CORONARY ANGIOGRAM;  Surgeon: Hillary Bow, MD;  Location: [facility] CATH LAB;  Service: Cardiovascular;  Laterality: N/A;  . SPINE SURGERY    . VARICOSE VEIN SURGERY    . VESICOVAGINAL FISTULA CLOSURE W/ TAH      OB History    Gravida Para Term Preterm AB Living   3 2 2  0 1 2   SAB TAB Ectopic Multiple Live Births   1               Home Medications    Prior to Admission medications   Medication Sig Start Date End Date Taking? Authorizing Provider  amLODipine (NORVASC) 5 MG tablet TAKE 1 TABLET (5 MG TOTAL) BY MOUTH DAILY. 07/25/15  Yes Pixie Casino, MD  aspirin 81 MG chewable tablet Chew 81 mg by mouth daily.    Yes Historical Provider, MD  cholecalciferol (VITAMIN D) 1000 UNITS tablet Take 1,000 Units by mouth daily.   Yes Historical Provider, MD  cyclobenzaprine (FLEXERIL) 5 MG tablet Take 2 tablets (10 mg total) by mouth 3 (three) times daily as needed for muscle spasms. 05/15/16  Yes Jessica Ratliff Hoffman, DO  hydrochlorothiazide (HYDRODIURIL) 12.5 MG tablet Take 6.25 mg by mouth daily as needed (for High Blood Pressure >145/98). Second line medication.   Yes Historical Provider, MD  hydrocortisone (CORTEF) 10 MG tablet Take 10-20 mg by mouth 2 (two) times daily. Takes 20 mg in the morning and 10 mg at night 04/07/16  Yes Historical Provider, MD  levothyroxine  (SYNTHROID, LEVOTHROID) 75 MCG tablet Take 75 mcg by mouth daily before breakfast.   Yes Historical Provider, MD  LORazepam (ATIVAN) 2 MG tablet Take 2 mg by mouth 4 (four) times daily as needed for anxiety. 05/06/16  Yes Historical Provider, MD  meclizine (ANTIVERT) 25 MG tablet 1 or 2 tabs PO q8h prn dizziness Patient taking differently: Take 25-50 mg by mouth 3 (three) times daily as needed (for vertigo).  04/16/16  Yes Francine Graven, DO  metoCLOPramide (REGLAN) 10 MG tablet Take 10 mg by mouth 3 (three) times daily before meals.  06/21/16  Yes Historical Provider, MD  nitroGLYCERIN (NITROSTAT) 0.4 MG SL tablet Place 0.4 mg under the tongue every 5 (five) minutes as needed for chest pain.   Yes Historical Provider, MD  oxycodone (ROXICODONE) 30 MG immediate release tablet Take 30 mg by mouth every 4 (four) hours as needed for pain.   Yes Historical Provider, MD  pantoprazole (PROTONIX) 20 MG tablet Take 1 tablet (20 mg total) by mouth daily. Patient taking differently: Take 20 mg by mouth daily as needed for indigestion.  02/04/16  Yes Jaime Pilcher Ward, PA-C  promethazine (PHENERGAN) 25 MG tablet Take 0.5-1 tablets (12.5-25 mg total) by mouth every 6 (six) hours as needed for nausea or vomiting. 08/28/16  Yes Margarita Mail, PA-C  vitamin C (ASCORBIC ACID) 500 MG tablet Take 500 mg by mouth daily.   Yes Historical Provider, MD  zolpidem (AMBIEN) 10 MG tablet Take 10 mg by mouth at bedtime. 05/06/16  Yes Historical Provider, MD  doxycycline (VIBRAMYCIN) 100 MG capsule Take 1 capsule (100 mg total) by mouth 2 (two) times daily. Patient not taking: Reported on 09/05/2016 08/25/16   Elmyra Ricks Pisciotta, PA-C  HYDROcodone-acetaminophen (NORCO/VICODIN) 5-325 MG tablet Take 1-2 tablets by mouth every 6 hours as needed for pain and/or cough. Patient not taking: Reported on 09/05/2016 08/25/16   Elmyra Ricks Pisciotta, PA-C  ondansetron (ZOFRAN ODT) 4 MG disintegrating tablet Take 1 tablet (4 mg total) by mouth  every 8 (eight) hours as needed for nausea or vomiting. Patient not taking: Reported on 09/05/2016 02/04/16   [facility] Ward, PA-C  ondansetron (ZOFRAN) 4 MG tablet Take 1 tablet (4 mg total) by mouth every 8 (eight) hours as needed for nausea or vomiting. Patient not taking: Reported on 09/05/2016 08/25/16   Elmyra Ricks Pisciotta, PA-C  oxycodone (ROXICODONE) 15 MG immediate release tablet Take 2 tablets (30 mg total) by mouth every 4 (four) hours as needed for pain. Patient not taking: Reported on 09/05/2016 05/15/16   Valinda Party, DO  zolpidem (AMBIEN) 5 MG tablet 5mg  by mouth at bedtime as needed for sleep Patient not taking: Reported on 09/05/2016 05/15/16  Valinda Party, DO    Family History Family History  Problem Relation Age of Onset  . Cancer Father     Colon  . Coronary artery disease    . Heart attack Mother     Social History Social History  Substance Use Topics  . Smoking status: Current Every Day Smoker    Packs/day: 0.50    Years: 10.00    Types: Cigarettes  . Smokeless tobacco: Never Used     Comment: smokes 6-7 cigarettes per day  . Alcohol use No     Allergies   Bee venom; Doxycycline; Erythromycin; Ibuprofen; Morphine and related; Penicillins; Sulfa antibiotics; Cephalexin; Dust mite extract; Tramadol; Other; and Sulfonamide derivatives   Review of Systems Review of Systems  Ten systems reviewed and are negative for acute change, except as noted in the HPI.      Physical Exam Updated Vital Signs BP 112/70   Pulse 82   Temp 98.2 F (36.8 C) (Oral)   Resp 12   SpO2 94%   Physical Exam  Constitutional: She is oriented to person, place, and time. She appears well-developed and well-nourished. No distress.  Smells of cigarettes  HENT:  Head: Normocephalic and atraumatic.  Eyes: Conjunctivae are normal. No scleral icterus.  Neck: Normal range of motion.  Cardiovascular: Normal rate, regular rhythm and normal heart sounds.  Exam  reveals no gallop and no friction rub.   No murmur heard. Pulmonary/Chest: Effort normal and breath sounds normal. No respiratory distress.  Abdominal: Soft. Bowel sounds are normal. She exhibits no distension and no mass. There is no tenderness. There is no guarding.  Musculoskeletal:  Reproducible pain in the left shoulder blade  Neurological: She is alert and oriented to person, place, and time.  Skin: Skin is warm and dry. She is not diaphoretic.  Nursing note and vitals reviewed.    ED Treatments / Results  Labs (all labs ordered are listed, but only abnormal results are displayed) Labs Reviewed  BASIC METABOLIC PANEL - Abnormal; Notable for the following:       Result Value   Glucose, Bld 119 (*)    Calcium 8.6 (*)    All other components within normal limits  CBC - Abnormal; Notable for the following:    WBC 11.0 (*)    Platelets 426 (*)    All other components within normal limits  HEPATIC FUNCTION PANEL - Abnormal; Notable for the following:    Albumin 3.2 (*)    Bilirubin, Direct <0.1 (*)    All other components within normal limits  LIPASE, BLOOD  I-STAT TROPOININ, ED  I-STAT TROPOININ, ED    EKG  EKG Interpretation  Date/Time:  Sunday September 05 2016 04:41:33 EST Ventricular Rate:  88 PR Interval:  120 QRS Duration: 88 QT Interval:  364 QTC Calculation: 440 R Axis:   73 Text Interpretation:  Normal sinus rhythm Normal ECG No acute changes Nonspecific ST and T wave abnormality Confirmed by Kathrynn Humble, MD, Thelma Comp [id number]) on 09/05/2016 7:08:43 AM       Radiology Dg Chest 2 View  Result Date: 09/05/2016 CLINICAL DATA:  Mid chest pain.  Shortness of breath. EXAM: CHEST  2 VIEW COMPARISON:  08/02/2016 FINDINGS: The cardiomediastinal contours are normal. The lungs are clear. Pulmonary vasculature is normal. No consolidation, pleural effusion, or pneumothorax. No acute osseous abnormalities are seen. Surgical hardware in the lower cervical spine is partially  included. IMPRESSION: No active cardiopulmonary disease. Electronically Signed   By: Threasa Beards  Ehinger M.D.   On: 09/05/2016 06:10    Procedures Procedures (including critical care time)  Medications Ordered in ED Medications  oxyCODONE (Oxy IR/ROXICODONE) immediate release tablet 30 mg (30 mg Oral Given 09/05/16 0649)  meclizine (ANTIVERT) tablet 50 mg (50 mg Oral Given 09/05/16 0701)  sucralfate (CARAFATE) tablet 1 g (1 g Oral Given 09/05/16 0701)     Initial Impression / Assessment and Plan / ED Course  I have reviewed the triage vital signs and the nursing notes.  Pertinent labs & imaging results that were available during my care of the patient were reviewed by me and considered in my medical decision making (see chart for details).  Clinical Course as of Sep 05 1617  Sun Sep 05, 2016  [id number] Normal ECG, no QT prolongation ED EKG within 10 minutes [AH]  [phone] Cxr without acute abnormality  [AH]  0614 Mild leukocytosis WBC: (!) 11.0 [AH]  [id number] Troponin is negative Troponin i, poc: 0.00 [AH]  0618 Review of the [facility] shows that the patient recieves a monthly supply of oxycodone 30 mg tablets 5 times a day, along with zolpidem and lorazepam  [AH]  0833 Second troponin is negative.  Troponin i, poc: 0.00 [AH]    Clinical Course User Index [AH] Margarita Mail, PA-C    Patient with HEART score of 4 making her moderate risk for MACE. I have low suspicion for ACS with this patient given her negative cath 3.5 years ago. I have discussed the case with Dr. [name] Sours who feels that a second troponin is sufficient work up for today's complaint, however I will recommend op stress testing .   Patient with 2 negative troponins. I have low suspicion for unstable angina. Her pain is improved. I discussed return precautions with the patient. She is to follow up for outpatient stress test. Patient is to also follow-up with her PCP tomorrow. She appears safe for discharge at this time. Final  Clinical Impressions(s) / ED Diagnoses   Final diagnoses:  Nonspecific chest pain  Chronic pain of multiple sites  Nausea    New Prescriptions Discharge Medication List as of 09/05/2016  8:47 AM       Margarita Mail, PA-C 09/05/16 [place], MD 09/05/16 2309

## 2016-09-05 NOTE — Progress Notes (Signed)
PHARMACIST COMMUNICATION   DESCRIPTION: Spoke w/ PA Margarita Mail re: pt requests last rabies vaccine today, currently at day 11 of series w/ third dose rec'd 1d late (day 8 of series), final vaccine due 09/09/16 2/2 late 3rd dose with no evidence that it is acceptable to complete series early.  Pt being notified by PA.  Wynona Neat, PharmD, BCPS 09/05/2016 6:45 AM

## 2016-09-08 DIAGNOSIS — M25531 Pain in right wrist: Secondary | ICD-10-CM | POA: Diagnosis not present

## 2016-09-08 DIAGNOSIS — S52201D Unspecified fracture of shaft of right ulna, subsequent encounter for closed fracture with routine healing: Secondary | ICD-10-CM | POA: Diagnosis not present

## 2016-09-16 DIAGNOSIS — E273 Drug-induced adrenocortical insufficiency: Secondary | ICD-10-CM | POA: Diagnosis not present

## 2016-09-16 DIAGNOSIS — G894 Chronic pain syndrome: Secondary | ICD-10-CM | POA: Diagnosis not present

## 2016-09-16 DIAGNOSIS — Z1389 Encounter for screening for other disorder: Secondary | ICD-10-CM | POA: Diagnosis not present

## 2016-09-16 DIAGNOSIS — E663 Overweight: Secondary | ICD-10-CM | POA: Diagnosis not present

## 2016-09-16 DIAGNOSIS — F419 Anxiety disorder, unspecified: Secondary | ICD-10-CM | POA: Diagnosis not present

## 2016-09-16 DIAGNOSIS — Z6828 Body mass index (BMI) 28.0-28.9, adult: Secondary | ICD-10-CM | POA: Diagnosis not present

## 2016-09-27 DIAGNOSIS — S52201D Unspecified fracture of shaft of right ulna, subsequent encounter for closed fracture with routine healing: Secondary | ICD-10-CM | POA: Diagnosis not present

## 2016-09-27 DIAGNOSIS — M25531 Pain in right wrist: Secondary | ICD-10-CM | POA: Diagnosis not present

## 2016-10-15 IMAGING — CT CT ABD-PELV W/ CM
2 of 5 series · 17 of 46 positions shown, 19 images · IV contrast (Omni 300)
Comparison: 09/19/2015

CLINICAL DATA: Upper abdominal pain with nausea and vomiting.

EXAM:
CT ABDOMEN AND PELVIS WITH CONTRAST
TECHNIQUE: Multidetector CT imaging of the abdomen and pelvis was performed
using the standard protocol following bolus administration of
intravenous contrast.
CONTRAST:  100 mL [id number]

[Series 2: a/p w/ 5mm · axial · 0.78mm/px · z∈[-550,-150]mm · 14 of 90 slices shown, 16 images]
[im 5/90  soft-tissue]
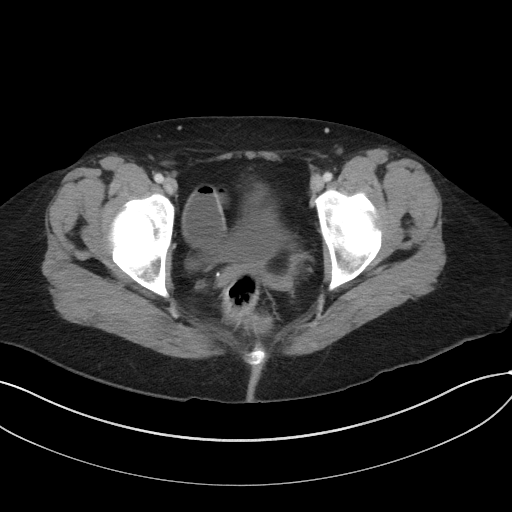
[im 5/90  bone]
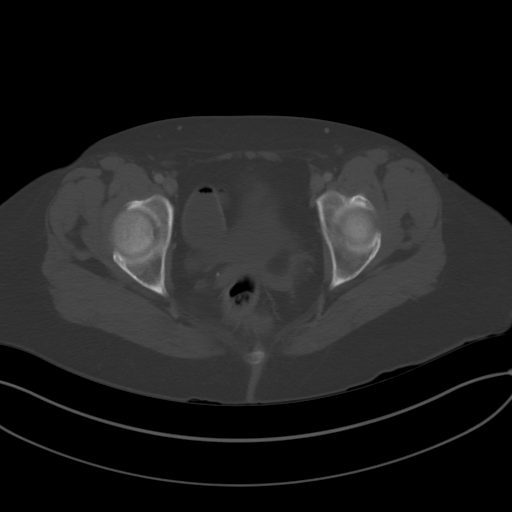
[im 10/90  soft-tissue]
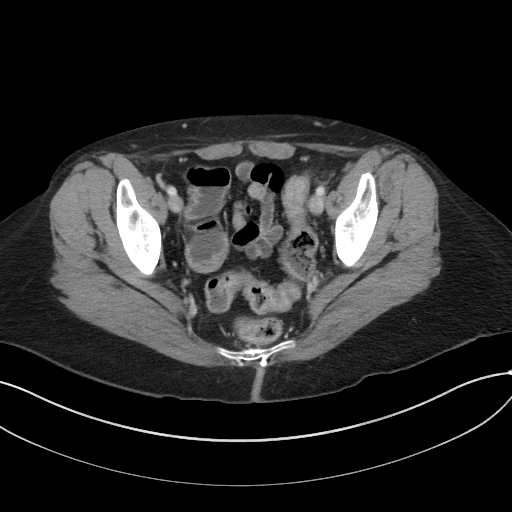
[im 20/90  soft-tissue]
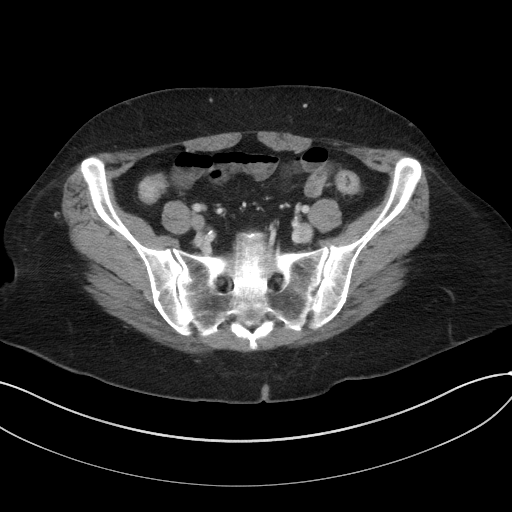
[im 25/90  soft-tissue]
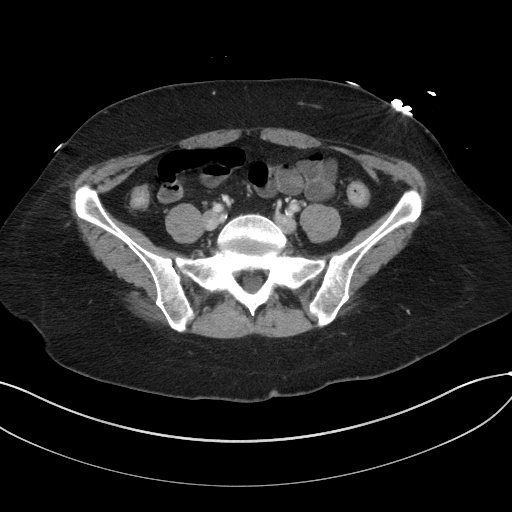
[im 30/90  soft-tissue]
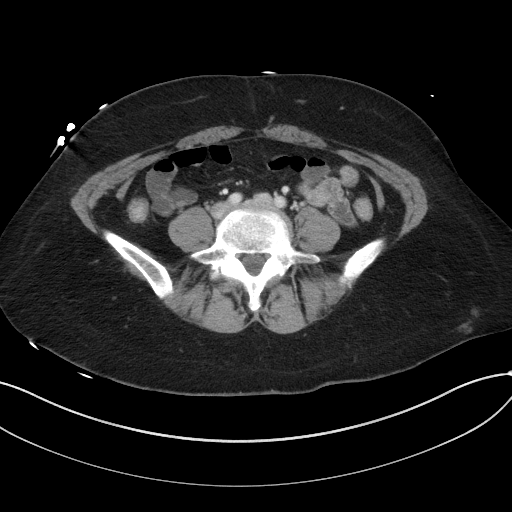
[im 35/90  soft-tissue]
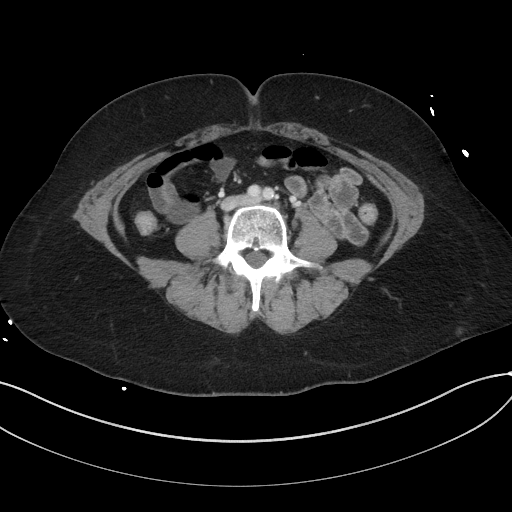
[im 40/90  soft-tissue]
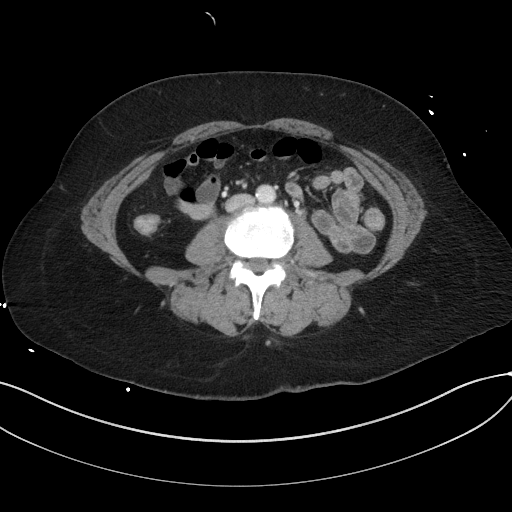
[im 50/90  soft-tissue]
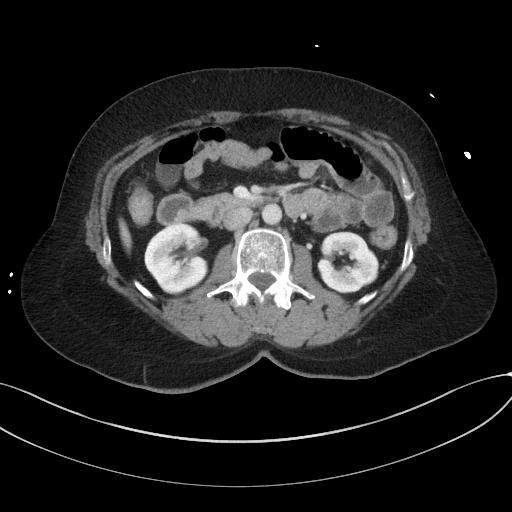
[im 55/90  soft-tissue]
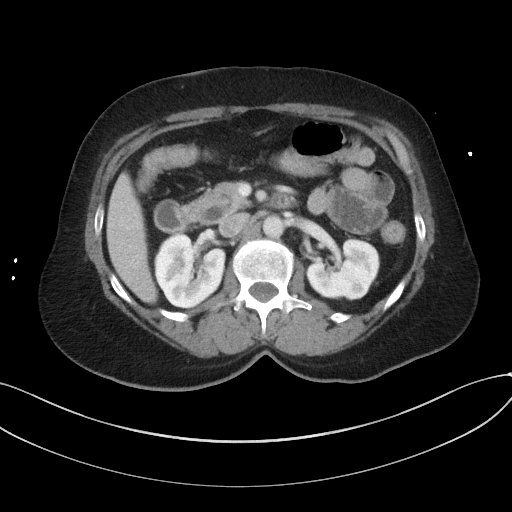
[im 55/90  bone]
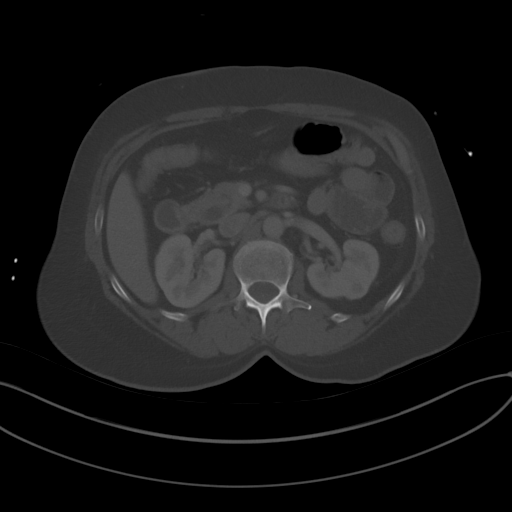
[im 60/90  soft-tissue]
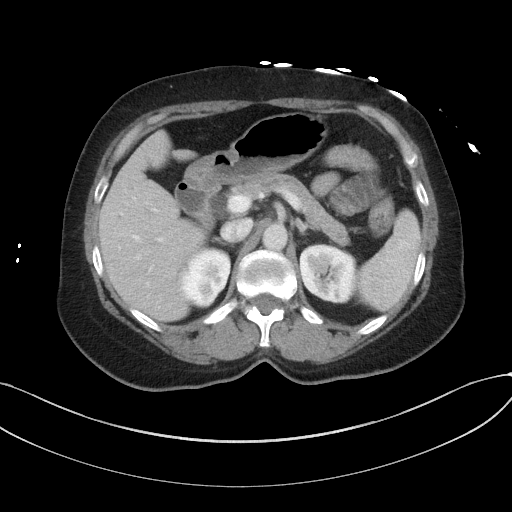
[im 65/90  soft-tissue]
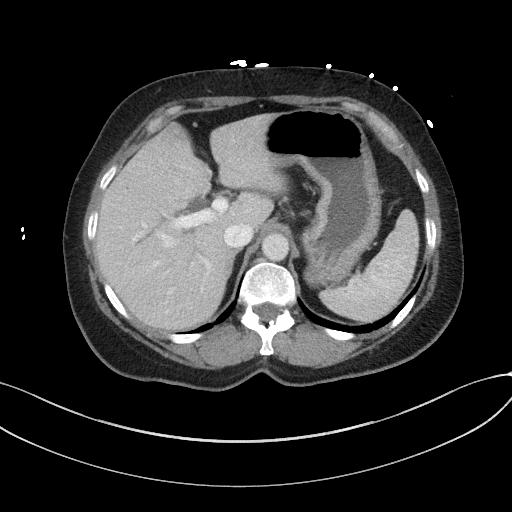
[im 70/90  soft-tissue]
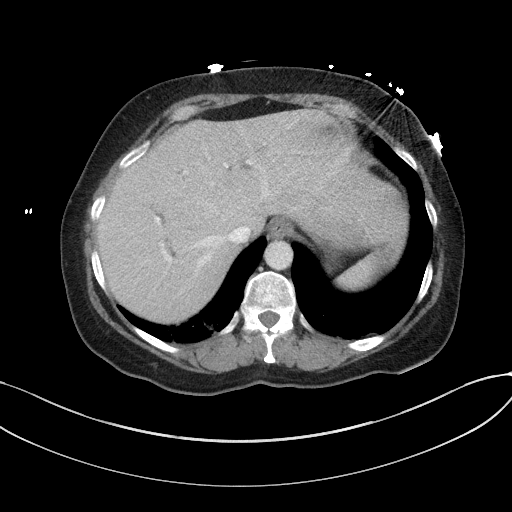
[im 80/90  soft-tissue]
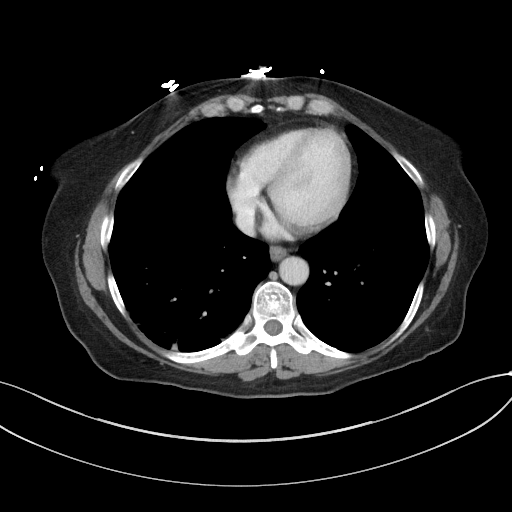
[im 85/90  soft-tissue]
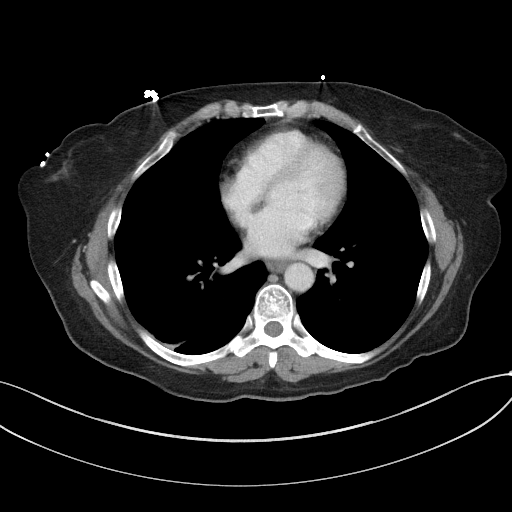

[Series 5: a/p w/ cor · coronal · 0.84mm/px · 3 of 121 slices shown]
[im 41/121  soft-tissue]
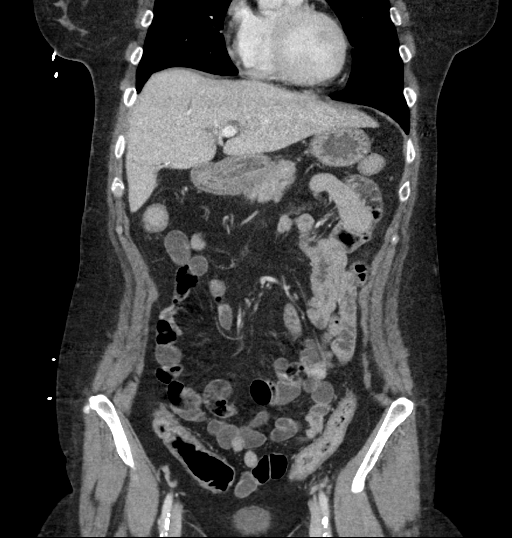
[im 54/121  soft-tissue]
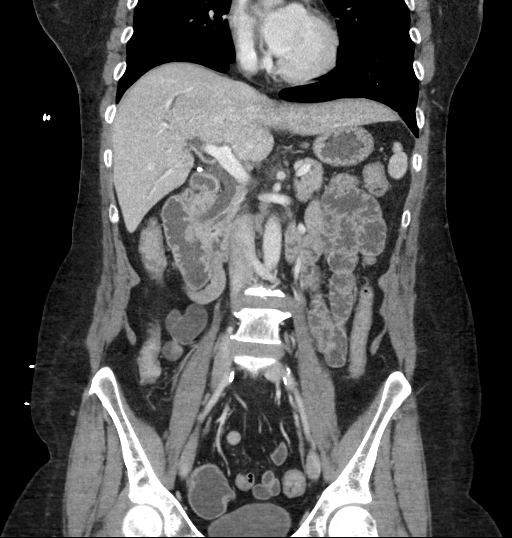
[im 67/121  soft-tissue]
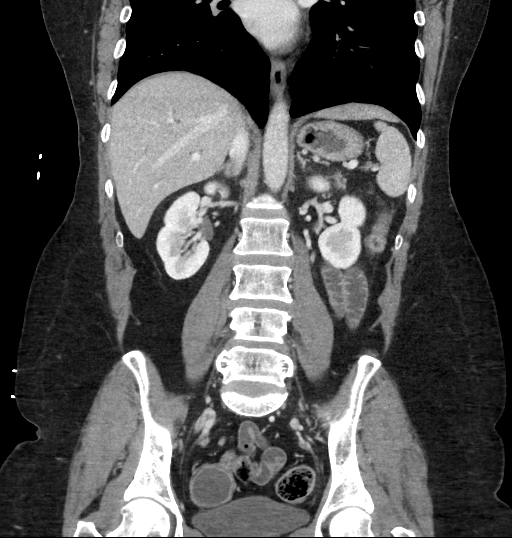

[17 of 46 positions shown; findings below may reference images not displayed]

FINDINGS: Lower chest: Dependent atelectasis at the lung bases. No pleural
effusions.

Hepatobiliary: Post cholecystectomy. Stable intrahepatic and
extrahepatic biliary dilatation. The distal common bile duct
measures 1.0 cm. Portal venous system is patent. No suspicious liver
findings.

Pancreas: Normal appearance of the pancreas without inflammation or
duct dilatation.

Spleen: Normal appearance of spleen without enlargement.

Adrenals/Urinary Tract: Normal adrenal glands. Normal kidneys.
Normal urinary bladder.

Stomach/Bowel: The cecum is located in the right hemipelvis and
normal appearance of the appendix. No evidence for bowel
obstruction. Normal appearance of stomach and small bowel.

Vascular/Lymphatic: Mild atherosclerotic disease in the aorta
without aneurysm. No suspicious lymphadenopathy in the abdomen or
pelvis.

Reproductive: Uterus has been removed. Evidence for normal ovarian
tissue bilaterally.

Other: No free fluid.  No free air.

Musculoskeletal: Stable area of sclerosis involving the left
superior pubic ramus. No acute bone abnormality. Degenerative facet
arthropathy in the lumbar spine.
IMPRESSION: No acute abnormality in the abdomen or pelvis.

Stable biliary dilatation, post cholecystectomy.

## 2016-11-12 DIAGNOSIS — S52201D Unspecified fracture of shaft of right ulna, subsequent encounter for closed fracture with routine healing: Secondary | ICD-10-CM | POA: Diagnosis not present

## 2016-11-12 DIAGNOSIS — M25531 Pain in right wrist: Secondary | ICD-10-CM | POA: Diagnosis not present

## 2016-12-07 DIAGNOSIS — Z6831 Body mass index (BMI) 31.0-31.9, adult: Secondary | ICD-10-CM | POA: Diagnosis not present

## 2016-12-07 DIAGNOSIS — R5383 Other fatigue: Secondary | ICD-10-CM | POA: Diagnosis not present

## 2016-12-07 DIAGNOSIS — E559 Vitamin D deficiency, unspecified: Secondary | ICD-10-CM | POA: Diagnosis not present

## 2016-12-07 DIAGNOSIS — Z79899 Other long term (current) drug therapy: Secondary | ICD-10-CM | POA: Diagnosis not present

## 2016-12-07 DIAGNOSIS — E6609 Other obesity due to excess calories: Secondary | ICD-10-CM | POA: Diagnosis not present

## 2016-12-07 DIAGNOSIS — Z1389 Encounter for screening for other disorder: Secondary | ICD-10-CM | POA: Diagnosis not present

## 2016-12-07 DIAGNOSIS — Z0001 Encounter for general adult medical examination with abnormal findings: Secondary | ICD-10-CM | POA: Diagnosis not present

## 2016-12-11 IMAGING — MR MR CERVICAL SPINE W/O CM
4 of 6 series · 14 of 48 positions shown · non-contrast
Comparison: MRI 04/13/2013

CLINICAL DATA: Cervical radiculopathy.

EXAM:
MRI CERVICAL SPINE WITHOUT CONTRAST
TECHNIQUE: Multiplanar, multisequence MR imaging of the cervical spine was
performed. No intravenous contrast was administered.

[Series 3: T2 · sagittal · 3.0mm · 0.47mm/px · 4 of 13 slices shown (1 of 2)]
[im 1/13]
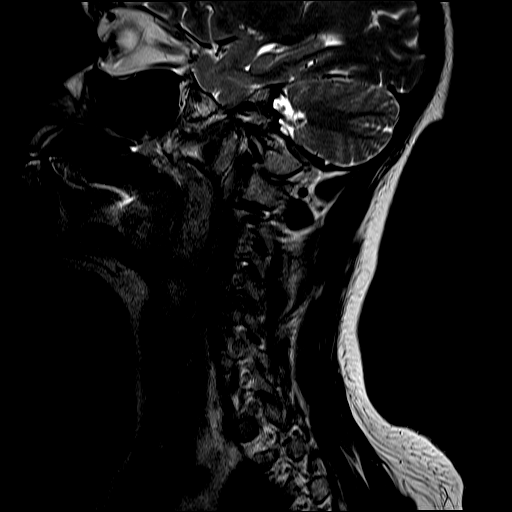
[im 5/13]
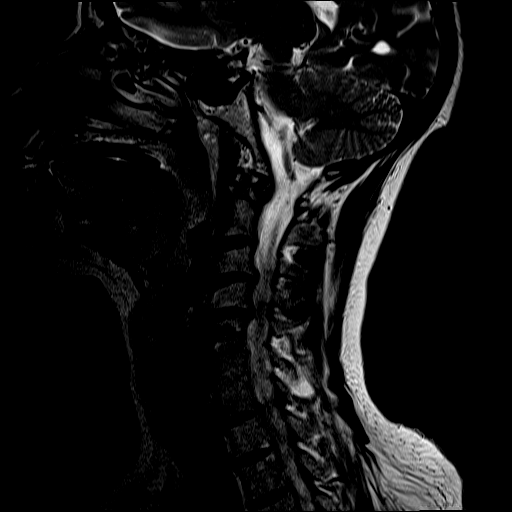
[im 9/13]
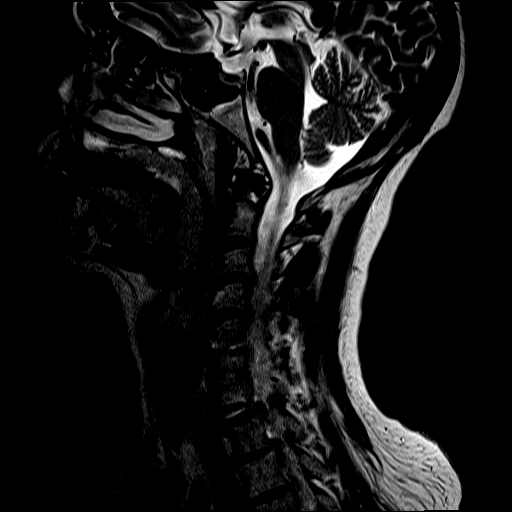
[im 13/13]
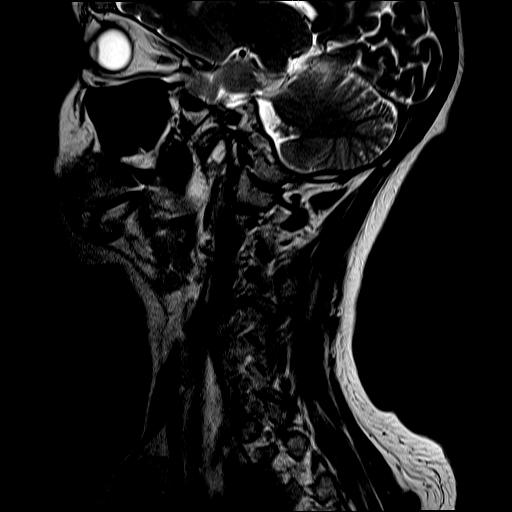

[Series 4: FLAIR · sagittal · 3.0mm · 0.49mm/px · 3 of 13 slices shown]
[im 1/13]
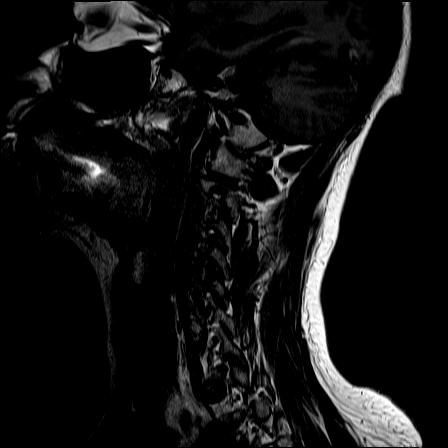
[im 7/13]
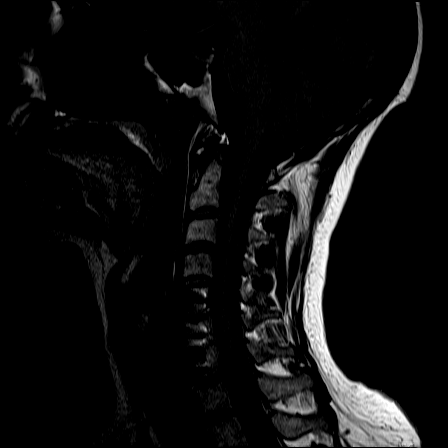
[im 13/13]
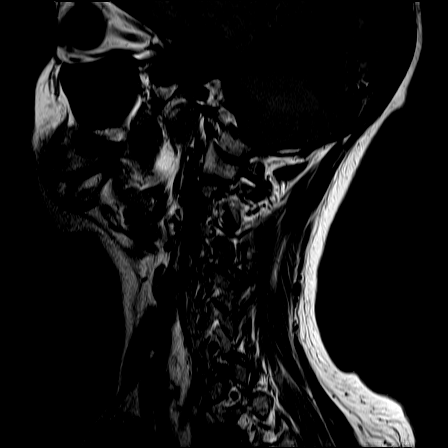

[Series 5: ir sagital · sagittal · 3.0mm · 0.27mm/px · 3 of 13 slices shown]
[im 1/13]
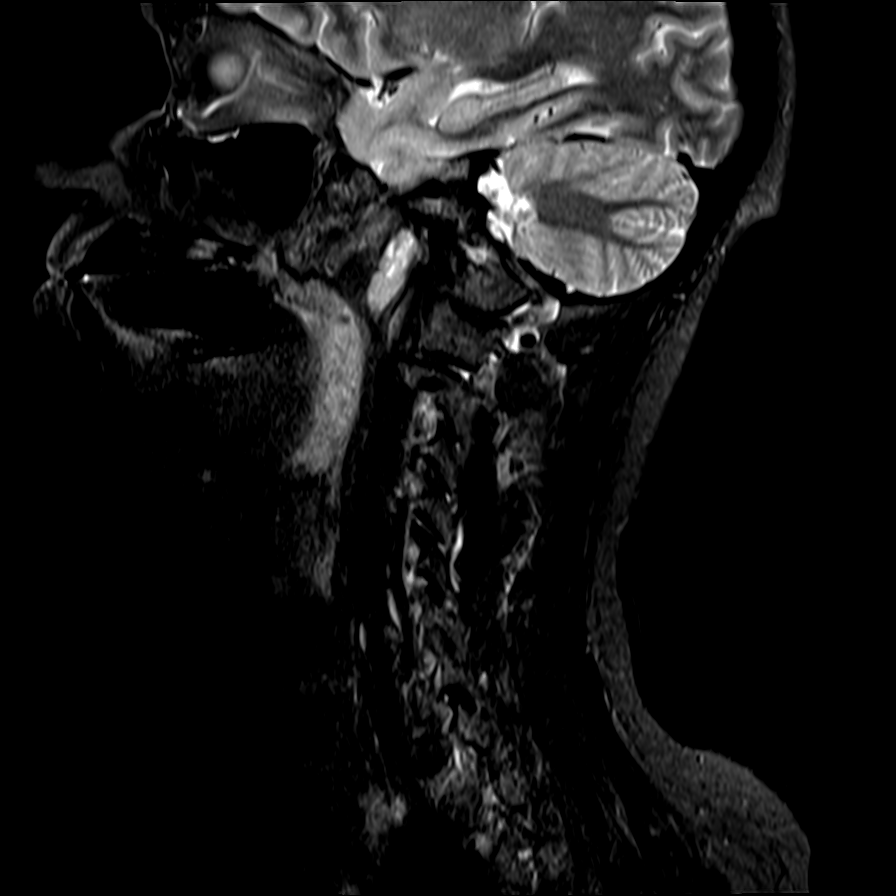
[im 7/13]
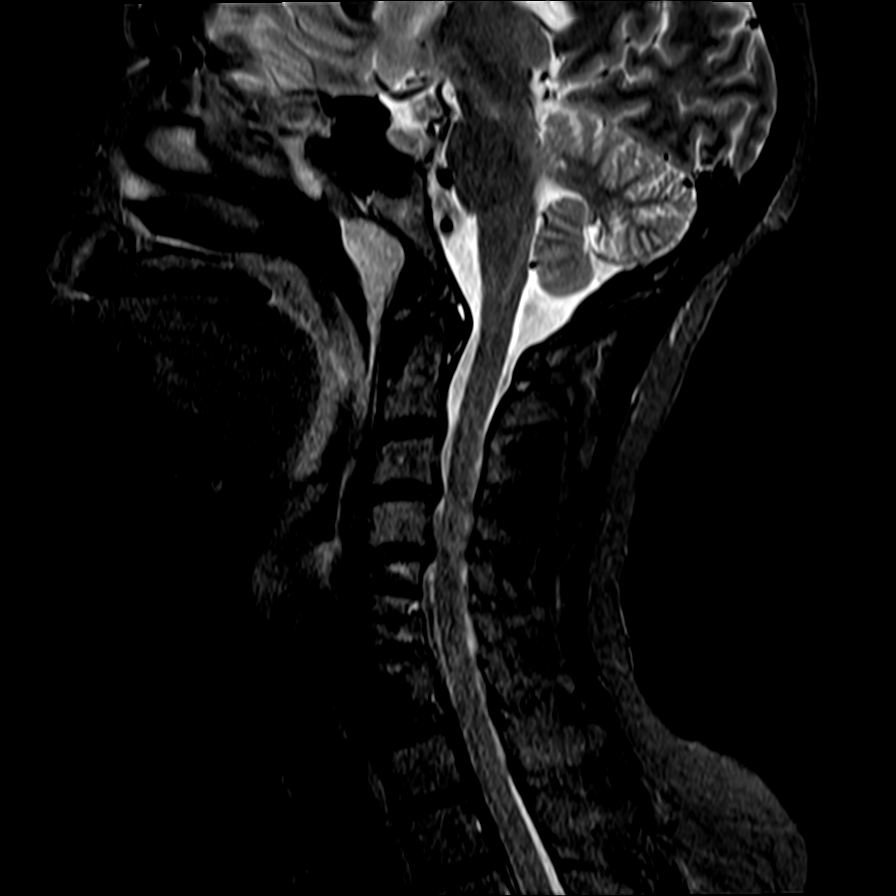
[im 13/13]
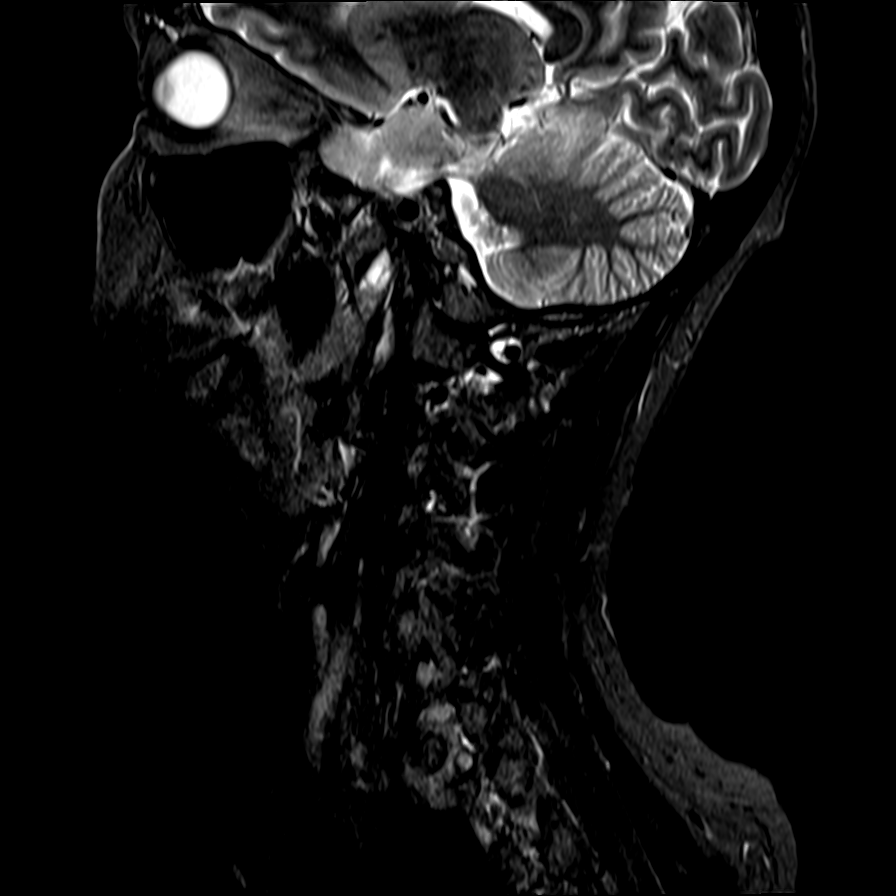

[Series 7: T2 · axial · 3.0mm · 0.20mm/px · z∈[-51,+43]mm · 4 of 36 slices shown (2 of 2)]
[im 1/36]
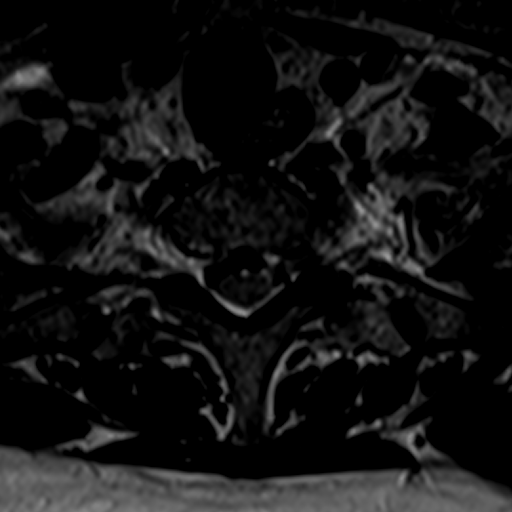
[im 6/36]
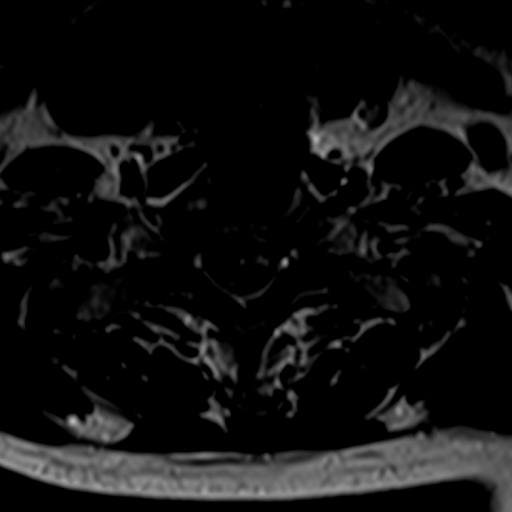
[im 19/36]
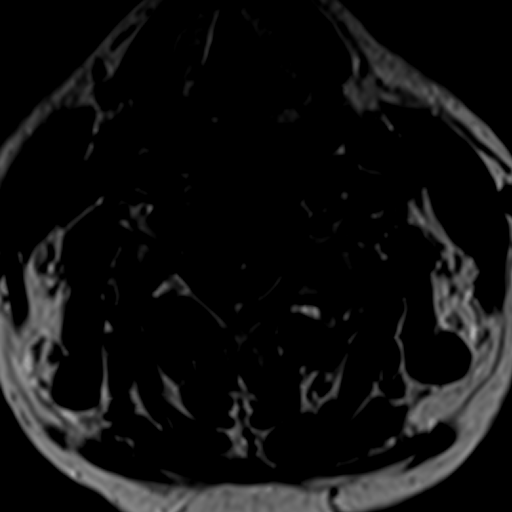
[im 30/36]
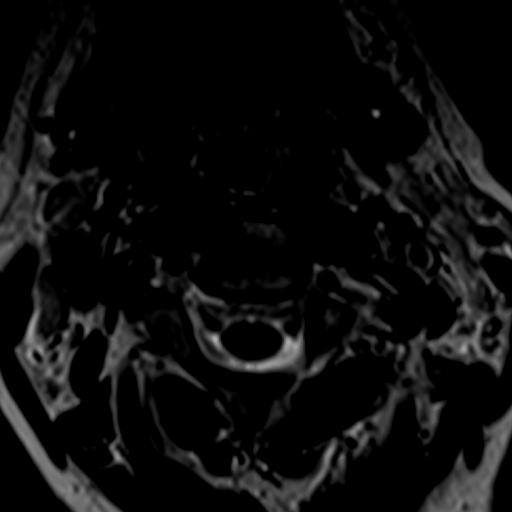

[14 of 48 positions shown; findings below may reference images not displayed]

FINDINGS: Image quality degraded by motion.

Alignment: Slight retrolisthesis C4-5 as noted previously.

Vertebrae: ACDF with metal plate and screws at C5-6 and C6-7.
Negative for fracture or mass lesion.

Cord: Cord evaluation limited due to motion. No cord signal
abnormality is detected.

Posterior Fossa, vertebral arteries, paraspinal tissues: Negative

Disc levels:

C2-3:  Negative

C3-4: Disc degeneration and spondylosis. Diffuse uncinate spurring
causing mild spinal stenosis and mild foraminal stenosis
bilaterally.

C4-5: Mild retrolisthesis. Disc degeneration and spondylosis.
Diffuse uncinate spurring causing mild spinal stenosis and moderate
foraminal stenosis bilaterally. No change from the prior study.

C5-6: Interbody fusion without stenosis.

C6-7:  Interbody fusion without stenosis

C7-T1: Moderate disc degeneration with diffuse uncinate spurring.
Mild spinal stenosis and moderate foraminal stenosis bilaterally
unchanged

T1-2:  Left foraminal encroachment due to spurring unchanged
IMPRESSION: Chronic degenerative changes in the cervical spine similar to the
prior study. No superimposed acute abnormality

ACDF C5-6 and C6-7 unchanged from the prior study.

## 2016-12-11 IMAGING — MR MR HEAD W/O CM
6 of 10 series · 22 of 48 positions shown · non-contrast
Comparison: Head CTA 04/01/2012.  Head MRI 09/20/2012.

CLINICAL DATA: Vertebrobasilar insufficiency. Evaluation for
posterior stroke.

EXAM:
MRI HEAD WITHOUT CONTRAST
TECHNIQUE: Multiplanar, multiecho pulse sequences of the brain and surrounding
structures were obtained without intravenous contrast.

[Series 4: T1 · sagittal · 5.0mm · 0.41mm/px · 2 of 21 slices shown (1 of 2)]
[im 1/21]
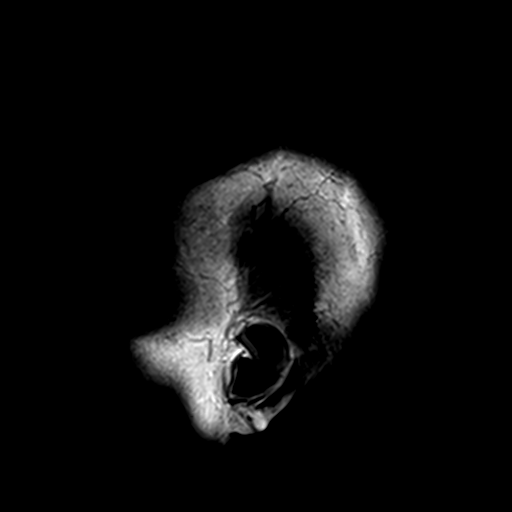
[im 21/21]
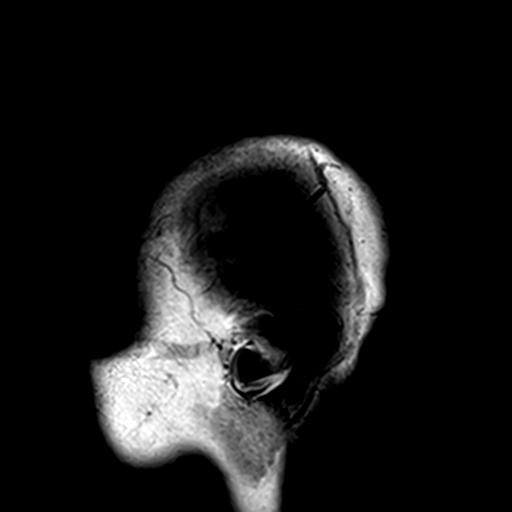

[Series 5: T2 · axial · 5.0mm · 0.46mm/px · z∈[+100,+242]mm · 3 of 23 slices shown (1 of 2)]
[im 1/23]
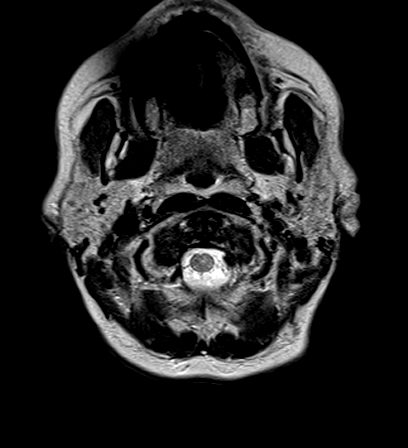
[im 12/23]
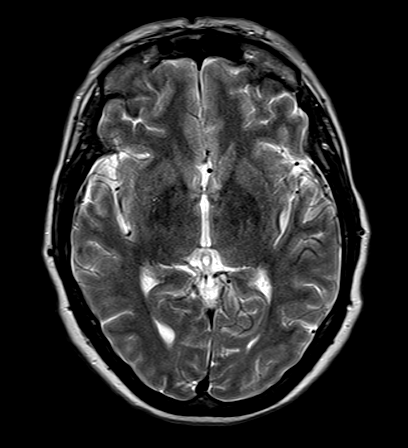
[im 23/23]
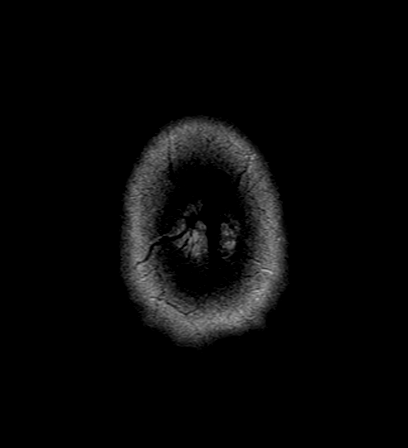

[Series 6: FLAIR · axial · 5.0mm · 0.34mm/px · z∈[+101,+243]mm · 3 of 23 slices shown]
[im 1/23]
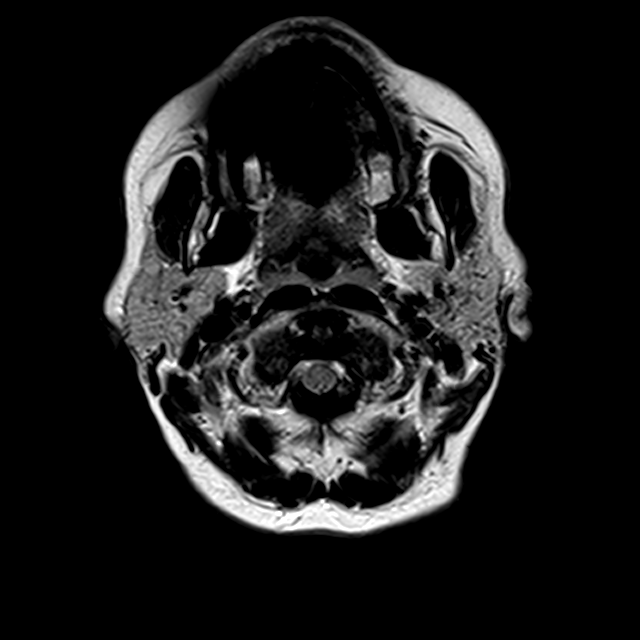
[im 12/23]
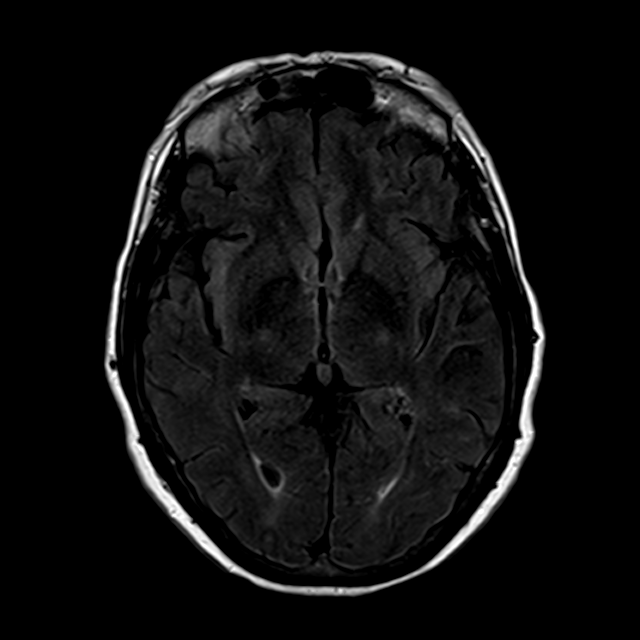
[im 23/23]
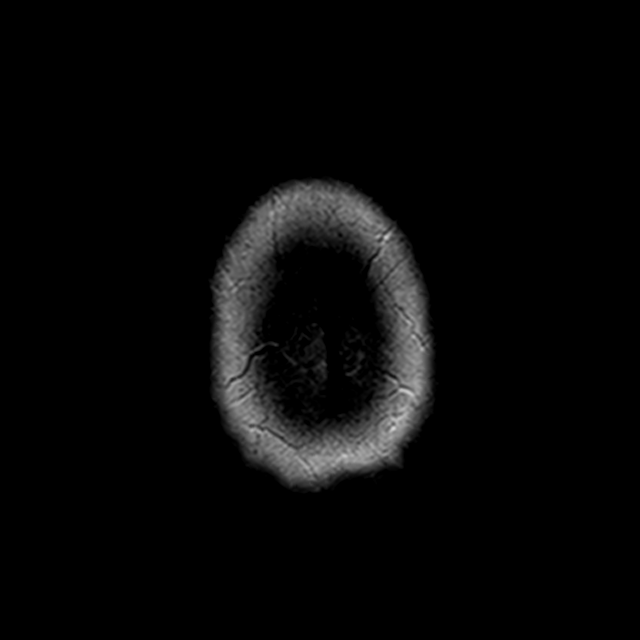

[Series 7: T1 · axial · 2.0mm · 0.40mm/px · z∈[+94,+241]mm · 8 of 75 slices shown (2 of 2)]
[im 1/75]
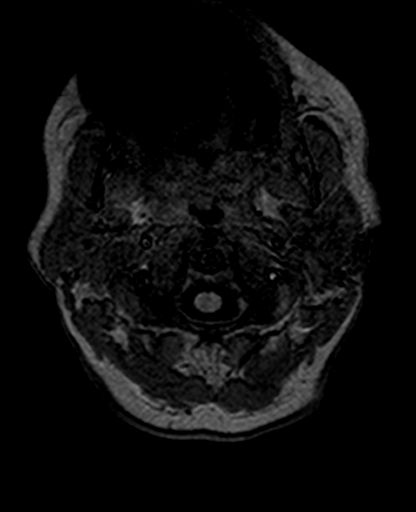
[im 10/75]
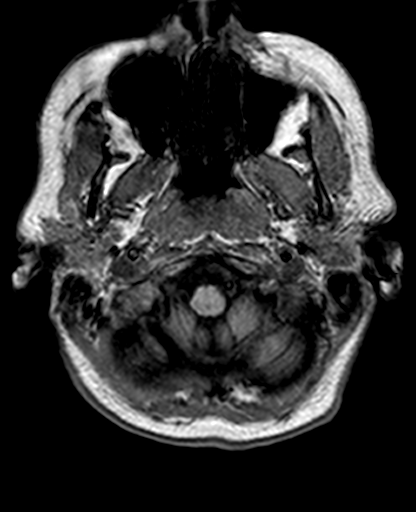
[im 19/75]
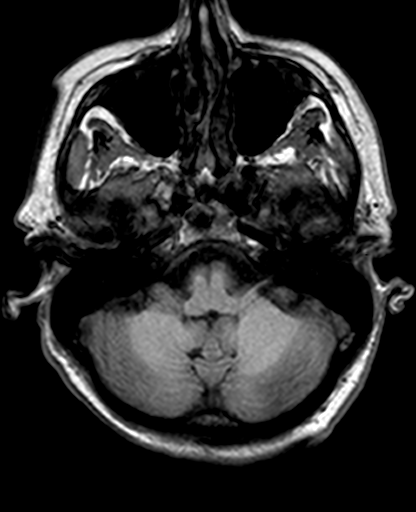
[im 28/75]
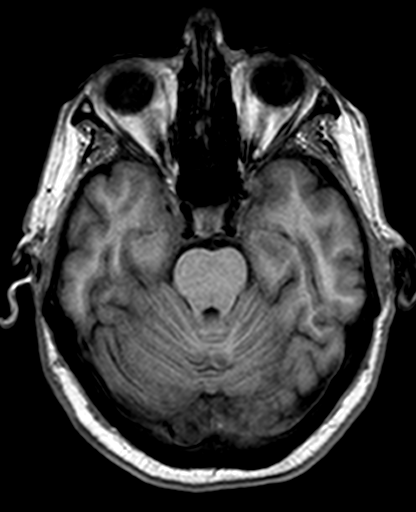
[im 47/75]
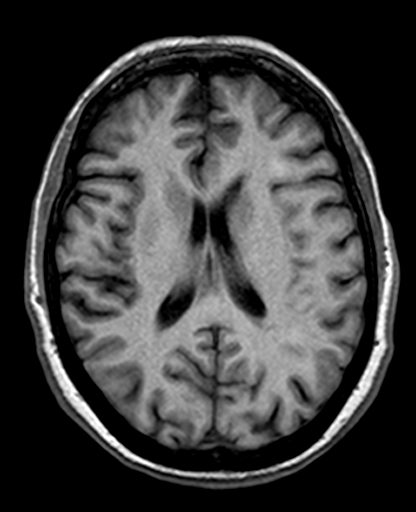
[im 56/75]
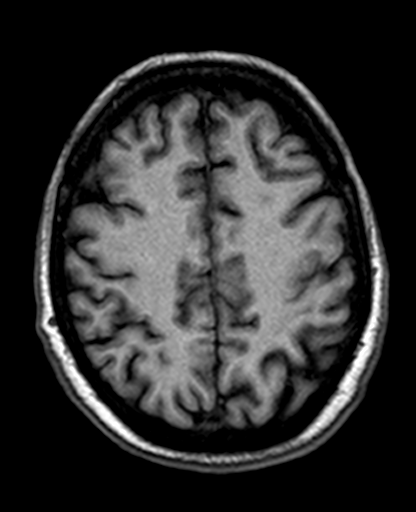
[im 65/75]
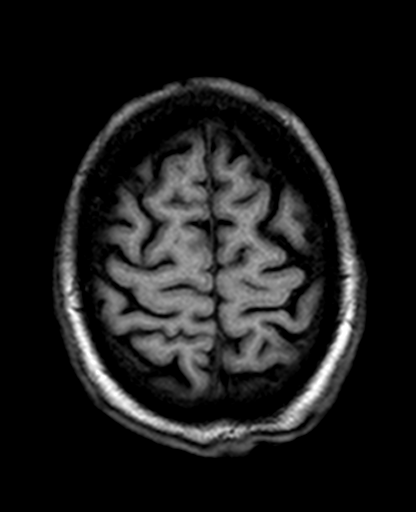
[im 75/75]
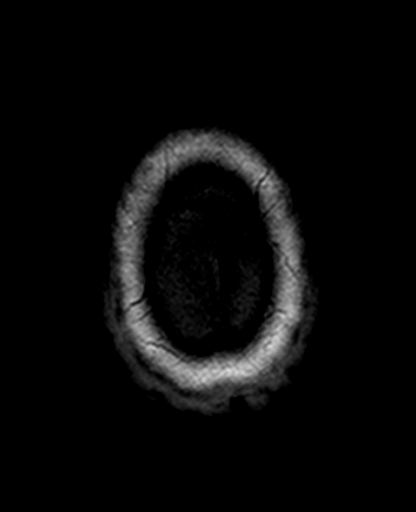

[Series 8: trauma axial · axial · 5.0mm · 0.40mm/px · z∈[+100,+242]mm · 3 of 23 slices shown]
[im 1/23]
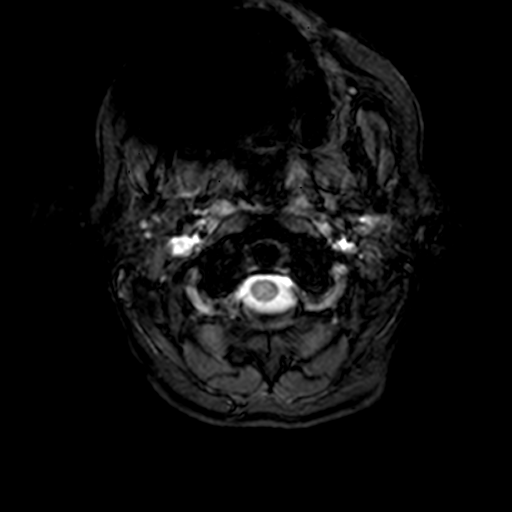
[im 12/23]
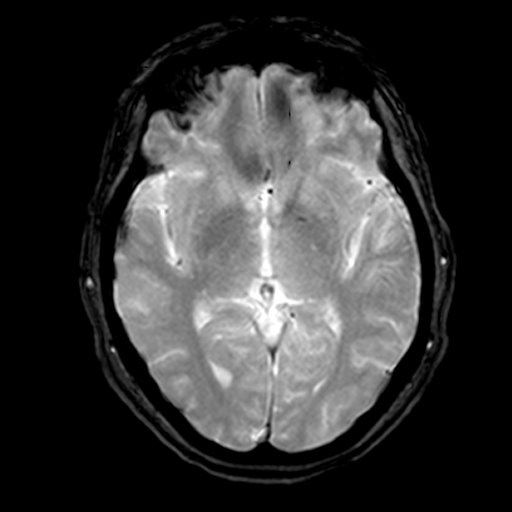
[im 23/23]
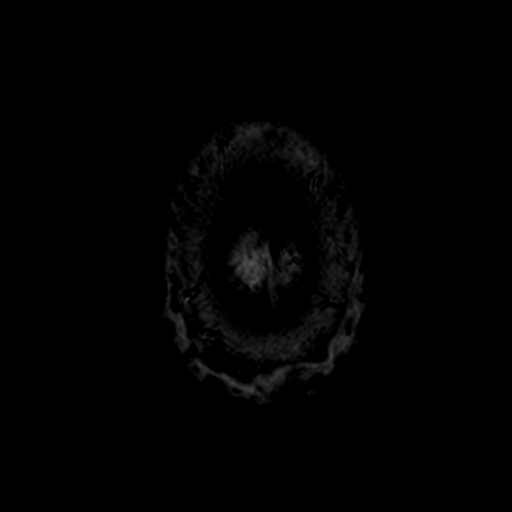

[Series 9: T2 · coronal · 5.0mm · 0.42mm/px · 3 of 26 slices shown (2 of 2)]
[im 1/26]
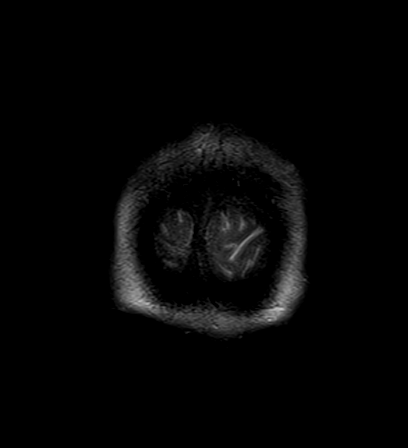
[im 13/26]
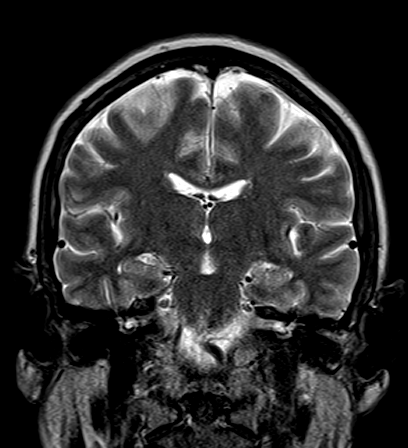
[im 26/26]
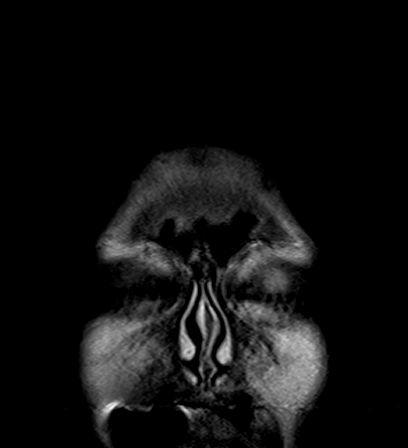

[22 of 48 positions shown; findings below may reference images not displayed]

FINDINGS: There is no evidence of acute infarct, intracranial hemorrhage,
mass, midline shift, or extra-axial fluid collection. The ventricles
and sulci are normal. A few punctate foci of T2 hyperintensity in
the cerebral white matter are unchanged, nonspecific, and not
greater than expected for patient's age. The brainstem and
cerebellum are normal.

Orbits are unremarkable. Paranasal sinuses and mastoid air cells are
clear. Major intracranial vascular flow voids are preserved. No
suspicious osseous lesion.
IMPRESSION: Negative brain MRI.

## 2016-12-11 IMAGING — CT CT ANGIO HEAD
1 of 9 series · 2 of 33 positions shown · IV contrast (ISOVUE)
Comparison: Head CT 03/27/2014.

CLINICAL DATA: Severe headache and vertigo. Posterior head and neck
pain radiating down the back and both arms for 2 weeks. Prior
cervical spine surgery.

EXAM:
CT ANGIOGRAPHY HEAD AND NECK
TECHNIQUE: Multidetector CT imaging of the head and neck was performed using
the standard protocol during bolus administration of intravenous
contrast. Multiplanar CT image reconstructions and MIPs were
obtained to evaluate the vascular anatomy. Carotid stenosis
measurements (when applicable) are obtained utilizing NASCET
criteria, using the distal internal carotid diameter as the
denominator.
CONTRAST:  75 mL Isovue 370

[Series 8: cta head_neck · axial · 0.45mm/px · z∈[+128,+254]mm · 2 of 190 slices shown]
[im 64/190  soft-tissue]
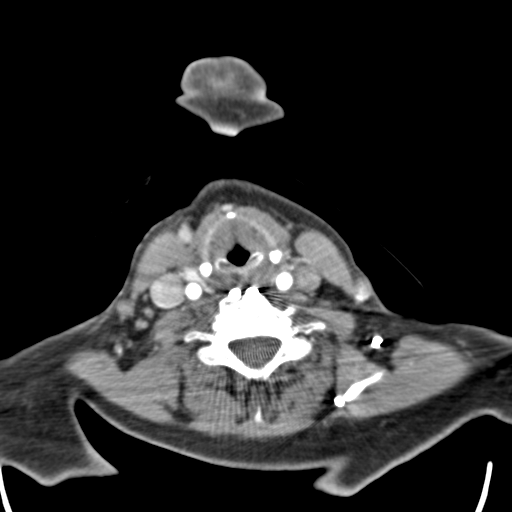
[im 127/190  bone]
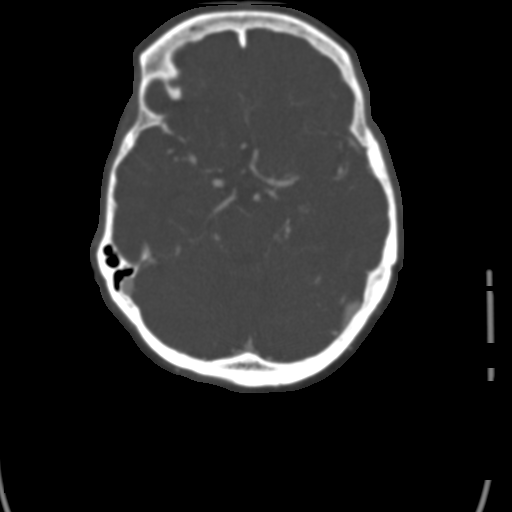

[2 of 33 positions shown; findings below may reference images not displayed]

Cervical spine MRI 05/03/2013.
Cervical spine CT 02/03/2013. Head MRI 09/20/2012.
FINDINGS: CT HEAD

Brain: There is no evidence of acute cortical infarct, intracranial
hemorrhage, mass, midline shift, or extra-axial fluid collection.
The ventricles and sulci are normal.

Calvarium and skull base: Unremarkable.

Paranasal sinuses: Clear.

Orbits: Unremarkable.

CTA NECK

Aortic arch: 3 vessel aortic arch. Brachiocephalic and subclavian
arteries are widely patent.

Right carotid system: Patent without evidence of stenosis or
dissection. Minimal atherosclerotic plaque in the proximal ICA.

Left carotid system: Patent without evidence of stenosis or
dissection. Mild atherosclerotic plaque about the carotid
bifurcation.

Vertebral arteries: Patent without evidence of stenosis or
dissection. Codominant.

Skeleton: Chronic, slightly depressed fracture of the anterior wall
of the right maxillary sinus. Solid C5-C7 ACDF. Uncovertebral
spurring results in moderate right neural foraminal stenosis at
C3-4, severe right neural foraminal stenosis at C4-5, and moderate
right greater than left foraminal stenosis at C7-T1.

Other neck: No neck mass.

CTA HEAD

Anterior circulation: The internal carotid arteries are patent from
skullbase to carotid termini without stenosis. There is a 2 mm
aneurysm projecting anteriorly from the proximal right cavernous ICA
(series 12, image 84). ACAs and MCAs are patent without evidence of
major branch occlusion or significant stenosis. The left A1 segment
is dominant.

Posterior circulation: Intracranial vertebral arteries are patent to
the basilar. There is mild non stenotic plaque in the distal left
vertebral artery. PICA and SCA origins are patent. Basilar artery is
widely patent. There is a patent right posterior communicating
artery, and the right P1 segment is hypoplastic. PCAs are patent
without evidence of significant proximal stenosis.

Venous sinuses: Patent.

Anatomic variants: Hypoplastic right A1 and right P1 segments.

Delayed phase: No abnormal enhancement.
IMPRESSION: 1. Mild extracranial atherosclerosis. No evidence of dissection or
stenosis.
2. No significant intracranial arterial stenosis or major branch
occlusion.
3. 2 mm right cavernous ICA aneurysm.
4. Solid C5-C7 ACDF. Moderate to severe multilevel neural foraminal
stenosis.

## 2017-01-03 ENCOUNTER — Emergency Department (HOSPITAL_BASED_OUTPATIENT_CLINIC_OR_DEPARTMENT_OTHER)
Admission: EM | Admit: 2017-01-03 | Discharge: 2017-01-03 | Disposition: A | Discharge status: Home / Self Care | Payer: Medicare Other | Attending: Emergency Medicine | Admitting: Emergency Medicine

## 2017-01-03 ENCOUNTER — Encounter (HOSPITAL_BASED_OUTPATIENT_CLINIC_OR_DEPARTMENT_OTHER): Payer: Self-pay | Admitting: *Deleted

## 2017-01-03 DIAGNOSIS — I110 Hypertensive heart disease with heart failure: Secondary | ICD-10-CM | POA: Insufficient documentation

## 2017-01-03 DIAGNOSIS — E039 Hypothyroidism, unspecified: Secondary | ICD-10-CM | POA: Diagnosis not present

## 2017-01-03 DIAGNOSIS — Z79899 Other long term (current) drug therapy: Secondary | ICD-10-CM | POA: Diagnosis not present

## 2017-01-03 DIAGNOSIS — G5702 Lesion of sciatic nerve, left lower limb: Secondary | ICD-10-CM

## 2017-01-03 DIAGNOSIS — F17210 Nicotine dependence, cigarettes, uncomplicated: Secondary | ICD-10-CM | POA: Insufficient documentation

## 2017-01-03 DIAGNOSIS — I509 Heart failure, unspecified: Secondary | ICD-10-CM | POA: Insufficient documentation

## 2017-01-03 DIAGNOSIS — I2510 Atherosclerotic heart disease of native coronary artery without angina pectoris: Secondary | ICD-10-CM | POA: Diagnosis not present

## 2017-01-03 DIAGNOSIS — M79605 Pain in left leg: Secondary | ICD-10-CM | POA: Diagnosis present

## 2017-01-03 DIAGNOSIS — Z7982 Long term (current) use of aspirin: Secondary | ICD-10-CM | POA: Insufficient documentation

## 2017-01-03 LAB — BASIC METABOLIC PANEL
ANION GAP: 10 (ref 5–15)
BUN: 16 mg/dL (ref 6–20)
CHLORIDE: 101 mmol/L (ref 101–111)
CO2: 26 mmol/L (ref 22–32)
Calcium: 9.6 mg/dL (ref 8.9–10.3)
Creatinine, Ser: 0.96 mg/dL (ref 0.44–1.00)
GFR calc Af Amer: >60 mL/min (ref >60)
GFR calc non Af Amer: >60 mL/min (ref >60)
GLUCOSE: 93 mg/dL (ref 65–99)
POTASSIUM: 4.1 mmol/L (ref 3.5–5.1)
Sodium: 137 mmol/L (ref 135–145)

## 2017-01-03 LAB — CBC WITH DIFFERENTIAL/PLATELET
BASOS ABS: 0 10*3/uL (ref 0.0–0.1)
Basophils Relative: 0 %
Eosinophils Absolute: 0.1 10*3/uL (ref 0.0–0.7)
Eosinophils Relative: 0 %
HEMATOCRIT: 43.5 % (ref 36.0–46.0)
HEMOGLOBIN: 14.6 g/dL (ref 12.0–15.0)
LYMPHS PCT: 18 %
Lymphs Abs: 2.5 10*3/uL (ref 0.7–4.0)
MCH: 28.5 pg (ref 26.0–34.0)
MCHC: 33.6 g/dL (ref 30.0–36.0)
MCV: 85 fL (ref 78.0–100.0)
MONO ABS: 0.5 10*3/uL (ref 0.1–1.0)
MONOS PCT: 4 %
NEUTROS ABS: 10.8 10*3/uL — AB (ref 1.7–7.7)
NEUTROS PCT: 78 %
Platelets: 418 10*3/uL — ABNORMAL HIGH (ref 150–400)
RBC: 5.12 MIL/uL — ABNORMAL HIGH (ref 3.87–5.11)
RDW: 14.6 % (ref 11.5–15.5)
WBC: 13.9 10*3/uL — ABNORMAL HIGH (ref 4.0–10.5)

## 2017-01-03 LAB — URINALYSIS, ROUTINE W REFLEX MICROSCOPIC
BILIRUBIN URINE: NEGATIVE
Glucose, UA: NEGATIVE mg/dL
HGB URINE DIPSTICK: NEGATIVE
KETONES UR: NEGATIVE mg/dL
NITRITE: NEGATIVE
PROTEIN: NEGATIVE mg/dL
Specific Gravity, Urine: 1.004 — ABNORMAL LOW (ref 1.005–1.030)
pH: 6 (ref 5.0–8.0)

## 2017-01-03 LAB — URINALYSIS, MICROSCOPIC (REFLEX)

## 2017-01-03 LAB — CK: Total CK: 74 U/L (ref 38–234)

## 2017-01-03 MED ORDER — HYDROCODONE-ACETAMINOPHEN 5-325 MG PO TABS
2.0000 | ORAL_TABLET | Freq: Once | ORAL | Status: AC
  Administered 2017-01-03: 2 via ORAL
  Filled 2017-01-03: qty 2

## 2017-01-03 MED ORDER — ONDANSETRON 4 MG PO TBDP: 4.0000 mg | ORAL_TABLET | Freq: Once | ORAL | Status: DC

## 2017-01-03 MED ORDER — PROMETHAZINE HCL 25 MG PO TABS
25.0000 mg | ORAL_TABLET | ORAL | Status: AC
  Administered 2017-01-03: 25 mg via ORAL
  Filled 2017-01-03: qty 1

## 2017-01-03 NOTE — ED Triage Notes (Signed)
Pt reports pain in left buttock x 3 weeks. Pt reports she was hospitalized in September for myositis and pain is in the same location. Also reports odor to her urine x 1 week

## 2017-01-03 NOTE — ED Notes (Signed)
Notified PA that patient is requesting medications for pain and nausea.

## 2017-01-03 NOTE — ED Provider Notes (Signed)
[facility] Provider Note   CSN: [id number] Arrival date & time: 01/03/17  1802   By signing my name below, I, Evelene Croon, attest that this documentation has been prepared under the direction and in the presence of Montine Circle, PA-C. Electronically Signed: Evelene Croon, Scribe. 01/03/2017. 6:37 PM.  History   Chief Complaint Chief Complaint  Patient presents with  . Leg Pain   The history is provided by the patient. No language interpreter was used.     HPI Comments:  [name] is a 51 y.o. female who presents to the Emergency Department complaining of constant pain to the left buttock x 3 weeks. No recent injury. No fever. No alleviating factors noted. She notes pain today is in the same area as pain she felt when diagnosed with left sided myositis with rhabdomyolysis in September 2017. She wants to ensure that she doesn't have the same today.  She also notes that her urine has been smelling very strong.   Past Medical History:  Diagnosis Date  . Adrenal insufficiency ([place])    diagnosed 2012  . Aneurysm ([place])   . Anxiety   . Astigmatism   . CAD (coronary artery disease)    Cath 2008 EF normal. RCA 50-60, Septal 50%. Myoview 3/12: EF 53% normal perfusion  . Cardiac arrest ([place])    2/2 adissonian crisis  . Cardiomyopathy    resolved  . Chest pain    chronicc  . CHF (congestive heart failure) ([place])   . Chronic back pain   . Chronic diarrhea   . Concussion    sept 28th 2014  . Gastroparesis   . HTN (hypertension)   . Hyperlipidemia   . Hypothyroidism   . Mitral valve prolapse   . Nondiabetic gastroparesis   . QT prolongation   . Tobacco abuse    down to 2 cigarettes per day    Patient Active Problem List   Diagnosis Date Noted  . Myositis 05/11/2016  . Anxiety 05/26/2015  . Tremors of nervous system 05/26/2015  . Neck pain 05/26/2015  . Hot flushes, perimenopausal 07/24/2013  . CAD (coronary artery disease) 04/11/2012  . GERD  (gastroesophageal reflux disease) 04/11/2012  . Addison's disease ([place]) 03/03/2012  . QT prolongation 12/15/2010  . Palpitations 01/01/2009  . Hypothyroidism 12/31/2008  . Hyperlipidemia 12/31/2008  . OVERWEIGHT/OBESITY 12/31/2008  . Essential hypertension 12/31/2008    Past Surgical History:  Procedure Laterality Date  . ABDOMINAL HYSTERECTOMY    . CARDIAC CATHETERIZATION  03/2007   showed 60% lesion in the right coronary artery  . CHOLECYSTECTOMY    . LEFT HEART CATHETERIZATION WITH CORONARY ANGIOGRAM N/A 04/13/2012   Procedure: LEFT HEART CATHETERIZATION WITH CORONARY ANGIOGRAM;  Surgeon: Hillary Bow, MD;  Location: [facility] CATH LAB;  Service: Cardiovascular;  Laterality: N/A;  . SPINE SURGERY    . VARICOSE VEIN SURGERY    . VESICOVAGINAL FISTULA CLOSURE W/ TAH      OB History    Gravida Para Term Preterm AB Living   3 2 2  0 1 2   SAB TAB Ectopic Multiple Live Births   1               Home Medications    Prior to Admission medications   Medication Sig Start Date End Date Taking? Authorizing Provider  amLODipine (NORVASC) 5 MG tablet TAKE 1 TABLET (5 MG TOTAL) BY MOUTH DAILY. Patient taking differently: TAKE 2 TABLET (10 MG TOTAL) BY MOUTH DAILY. 07/25/15  Yes Pixie Casino, MD  aspirin 81 MG chewable tablet Chew 81 mg by mouth daily.    Yes Historical Provider, MD  cholecalciferol (VITAMIN D) 1000 UNITS tablet Take 1,000 Units by mouth daily.   Yes Historical Provider, MD  cyclobenzaprine (FLEXERIL) 5 MG tablet Take 2 tablets (10 mg total) by mouth 3 (three) times daily as needed for muscle spasms. 05/15/16  Yes Jessica Ratliff Hoffman, DO  hydrochlorothiazide (HYDRODIURIL) 12.5 MG tablet Take 6.25 mg by mouth daily as needed (for High Blood Pressure >145/98). Second line medication.   Yes Historical Provider, MD  hydrocortisone (CORTEF) 10 MG tablet Take 10-20 mg by mouth 2 (two) times daily. Takes 20 mg in the morning and 10 mg at night 04/07/16  Yes Historical Provider, MD   levothyroxine (SYNTHROID, LEVOTHROID) 75 MCG tablet Take 75 mcg by mouth daily before breakfast.   Yes Historical Provider, MD  LORazepam (ATIVAN) 2 MG tablet Take 2 mg by mouth 4 (four) times daily as needed for anxiety. 05/06/16  Yes Historical Provider, MD  meclizine (ANTIVERT) 25 MG tablet 1 or 2 tabs PO q8h prn dizziness Patient taking differently: Take 25-50 mg by mouth 3 (three) times daily as needed (for vertigo).  04/16/16  Yes Francine Graven, DO  nitroGLYCERIN (NITROSTAT) 0.4 MG SL tablet Place 0.4 mg under the tongue every 5 (five) minutes as needed for chest pain.   Yes Historical Provider, MD  oxycodone (ROXICODONE) 15 MG immediate release tablet Take 2 tablets (30 mg total) by mouth every 4 (four) hours as needed for pain. 05/15/16  Yes Jessica Ratliff Hoffman, DO  oxycodone (ROXICODONE) 30 MG immediate release tablet Take 30 mg by mouth every 4 (four) hours as needed for pain.   Yes Historical Provider, MD  pantoprazole (PROTONIX) 20 MG tablet Take 1 tablet (20 mg total) by mouth daily. Patient taking differently: Take 20 mg by mouth daily as needed for indigestion.  02/04/16  Yes Jaime Pilcher Ward, PA-C  promethazine (PHENERGAN) 25 MG tablet Take 0.5-1 tablets (12.5-25 mg total) by mouth every 6 (six) hours as needed for nausea or vomiting. 08/28/16  Yes Margarita Mail, PA-C  vitamin C (ASCORBIC ACID) 500 MG tablet Take 500 mg by mouth daily.   Yes Historical Provider, MD  zolpidem (AMBIEN) 10 MG tablet Take 10 mg by mouth at bedtime. 05/06/16  Yes Historical Provider, MD  doxycycline (VIBRAMYCIN) 100 MG capsule Take 1 capsule (100 mg total) by mouth 2 (two) times daily. Patient not taking: Reported on 09/05/2016 08/25/16   Elmyra Ricks Pisciotta, PA-C  HYDROcodone-acetaminophen (NORCO/VICODIN) 5-325 MG tablet Take 1-2 tablets by mouth every 6 hours as needed for pain and/or cough. Patient not taking: Reported on 09/05/2016 08/25/16   Elmyra Ricks Pisciotta, PA-C  metoCLOPramide (REGLAN) 10 MG  tablet Take 10 mg by mouth 3 (three) times daily before meals.  06/21/16   Historical Provider, MD  ondansetron (ZOFRAN ODT) 4 MG disintegrating tablet Take 1 tablet (4 mg total) by mouth every 8 (eight) hours as needed for nausea or vomiting. Patient not taking: Reported on 09/05/2016 02/04/16   [facility] Ward, PA-C  ondansetron (ZOFRAN) 4 MG tablet Take 1 tablet (4 mg total) by mouth every 8 (eight) hours as needed for nausea or vomiting. Patient not taking: Reported on 09/05/2016 08/25/16   Elmyra Ricks Pisciotta, PA-C  zolpidem (AMBIEN) 5 MG tablet 5mg  by mouth at bedtime as needed for sleep 05/15/16   Valinda Party, DO    Family History Family History  Problem Relation Age of Onset  . Cancer Father     Colon  . Coronary artery disease    . Heart attack Mother     Social History Social History  Substance Use Topics  . Smoking status: Current Every Day Smoker    Packs/day: 0.50    Years: 10.00    Types: Cigarettes  . Smokeless tobacco: Never Used     Comment: smokes 6-7 cigarettes per day  . Alcohol use No     Allergies   Bee venom; Doxycycline; Erythromycin; Ibuprofen; Morphine and related; Penicillins; Sulfa antibiotics; Cephalexin; Dust mite extract; Tramadol; Other; and Sulfonamide derivatives   Review of Systems Review of Systems  Constitutional: Negative for fever.  Musculoskeletal: Positive for myalgias.  Neurological: Negative for weakness and numbness.  All other systems reviewed and are negative.    Physical Exam Updated Vital Signs There were no vitals taken for this visit.  Physical Exam  Constitutional: She is oriented to person, place, and time. She appears well-developed and well-nourished. No distress.  HENT:  Head: Normocephalic and atraumatic.  Eyes: EOM are normal.  Neck: Normal range of motion.  Cardiovascular: Normal rate, regular rhythm and normal heart sounds.   Pulmonary/Chest: Effort normal and breath sounds normal.  Abdominal:  Soft. She exhibits no distension. There is no tenderness.  Musculoskeletal: Normal range of motion.  Left buttock tender near piriformis ROM and strength 5/5  Neurological: She is alert and oriented to person, place, and time.  Skin: Skin is warm and dry.  No contusions, abscess, cellulitis  Psychiatric: She has a normal mood and affect. Judgment normal.  Nursing note and vitals reviewed.    ED Treatments / Results  DIAGNOSTIC STUDIES:    COORDINATION OF CARE:  6:30 PM Discussed treatment plan with pt at bedside and pt agreed to plan.  Labs (all labs ordered are listed, but only abnormal results are displayed) Labs Reviewed  URINALYSIS, ROUTINE W REFLEX MICROSCOPIC    EKG  EKG Interpretation None       Radiology No results found.  Procedures Procedures (including critical care time)  Medications Ordered in ED Medications - No data to display   Initial Impression / Assessment and Plan / ED Course  I have reviewed the triage vital signs and the nursing notes.  Pertinent labs & imaging results that were available during my care of the patient were reviewed by me and considered in my medical decision making (see chart for details).     Patient with left buttock pain.  She is concerned about recurrent myositis and rhabdo for which she was admitted in September 2017 after sitting on the toilet for 3 hours.  She reports that after discharge she went to PT and her symptoms gradually improved and went away.  She reports that over the last 3 weeks her symptoms have been returning.  No new trauma or prolonged stasis.  Will check labs and urine.  No myoglobin in urine.  Kidney function and CK are normal.  Suspect that this is more likely piriformis syndrome or similar.  Will discharge with outpatient follow-up and clear return precautions.  Recommend follow-up with PT.   Patient discussed with Dr. Tyrone Nine, who agrees with the plan.  Final Clinical Impressions(s) / ED  Diagnoses   Final diagnoses:  Piriformis syndrome of left side    New Prescriptions New Prescriptions   No medications on file   I personally performed the services described in this documentation, which was scribed in  my presence. The recorded information has been reviewed and is accurate.       Montine Circle, PA-C 01/03/17 [place], DO 01/03/17 2350

## 2017-01-19 IMAGING — CR DG CHEST 2V
2 series · 2 of 2 positions shown · non-contrast
Comparison: 05/10/2016

CLINICAL DATA: Fever, pain in legs, hypertension, CHF,
cardiomyopathy

EXAM:
CHEST  2 VIEW

[chest lat]
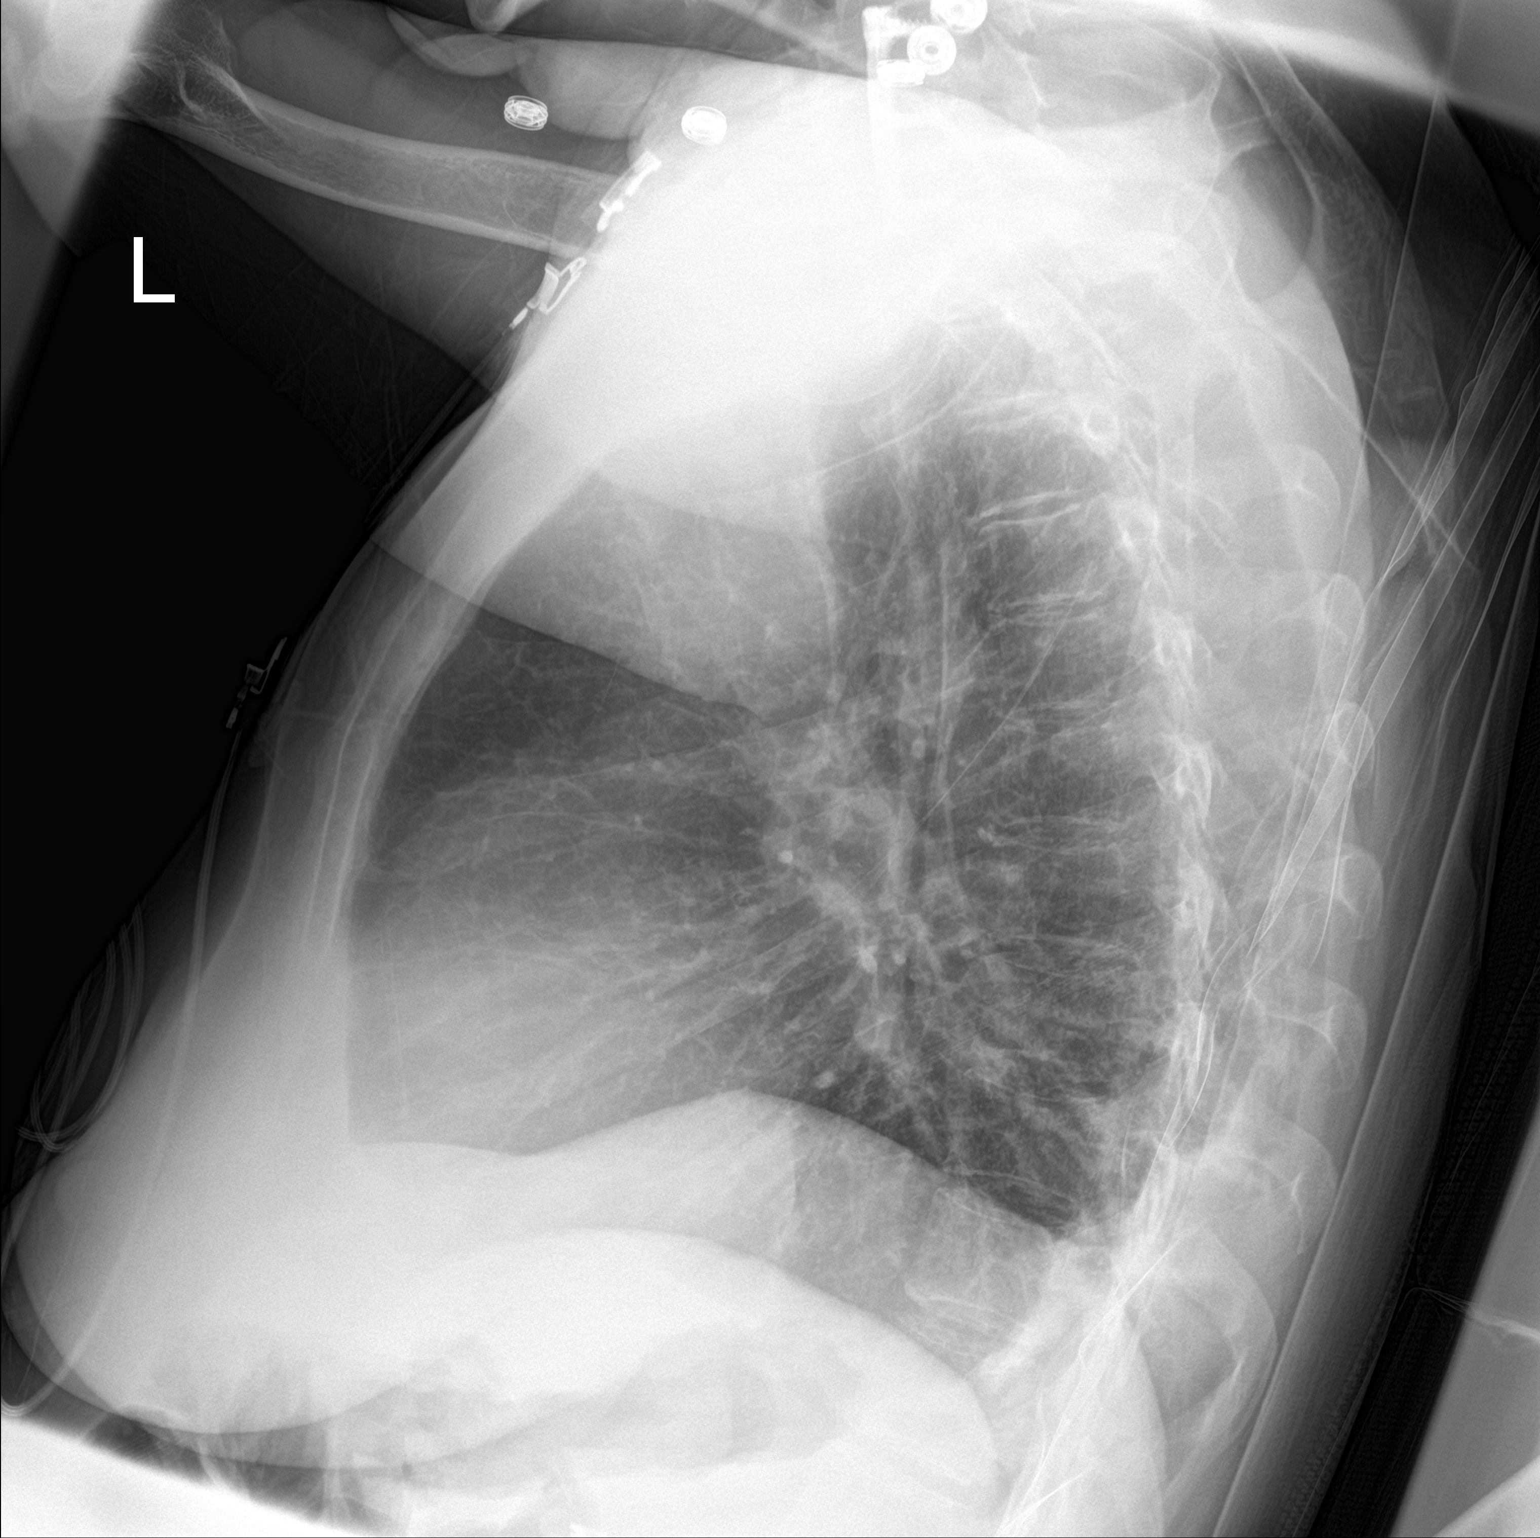

[chest ap]
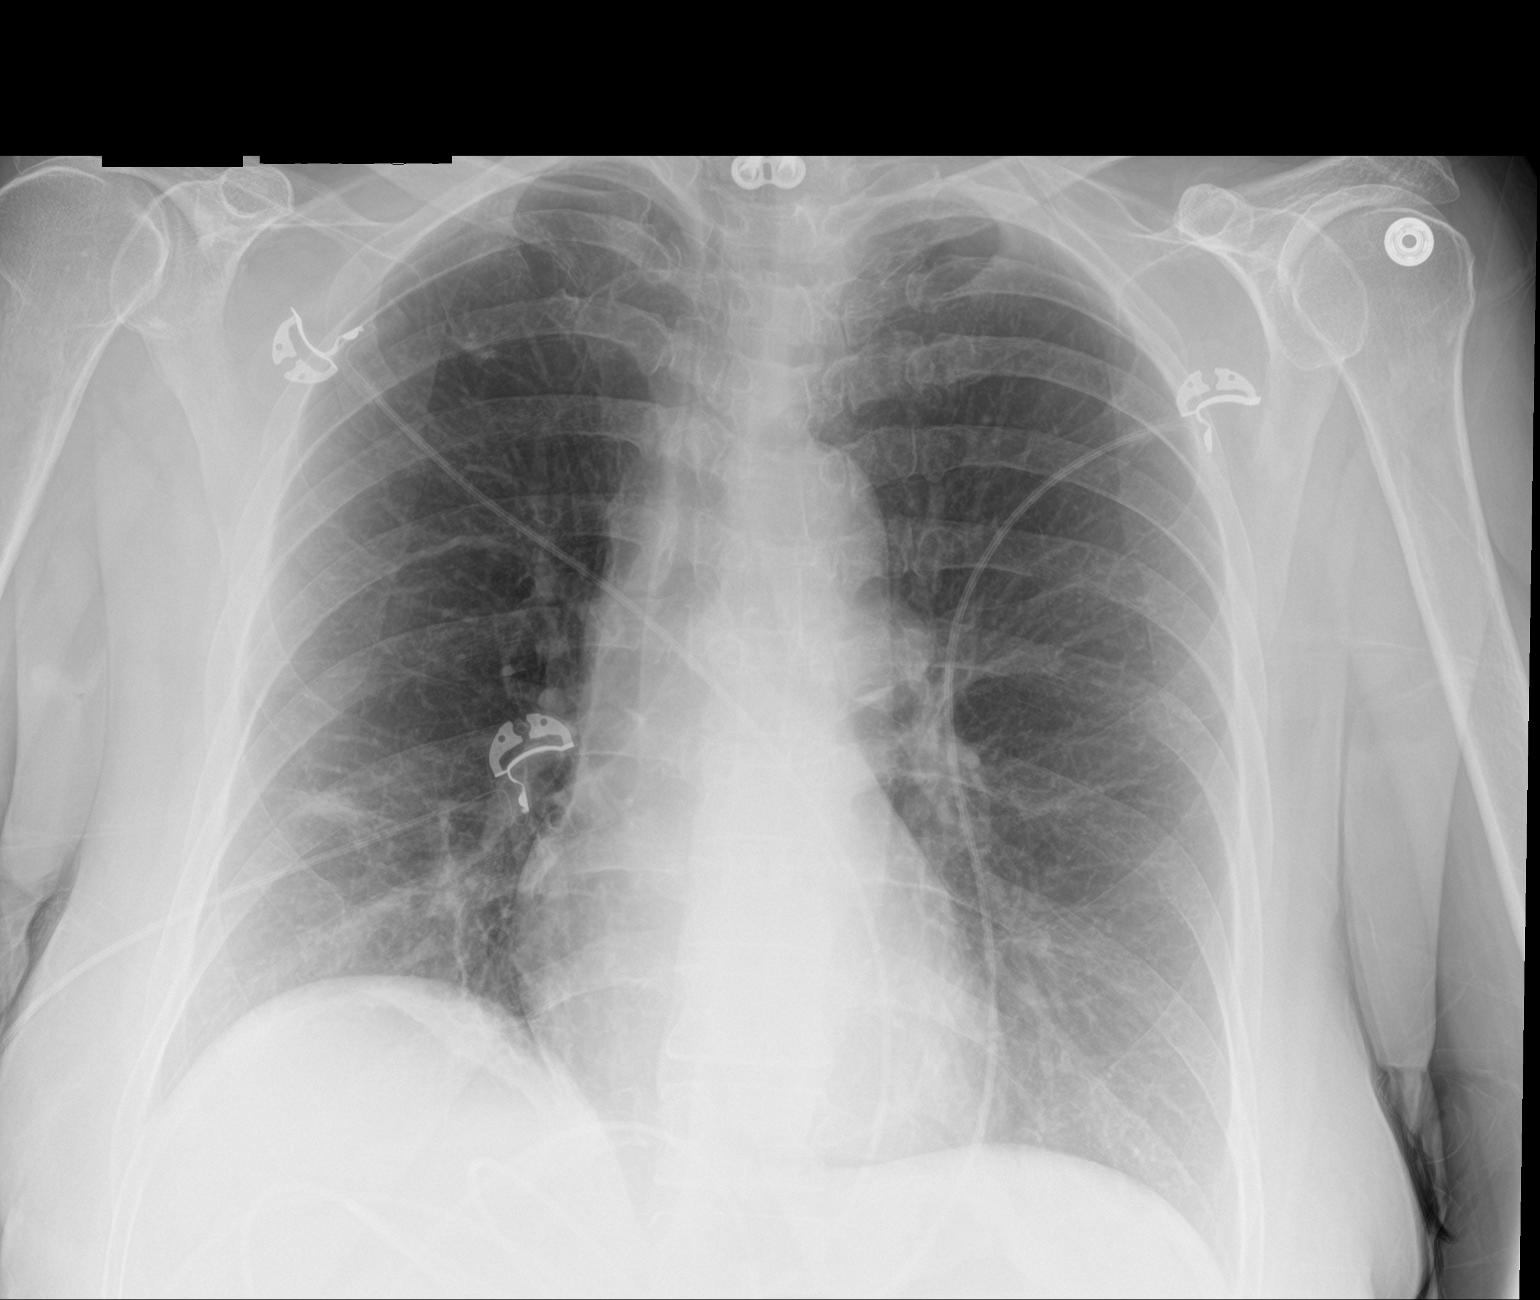

[2 of 2 positions shown; findings below may reference images not displayed]

FINDINGS: Normal heart size, mediastinal contours, and pulmonary vascularity.

Persistent atelectasis versus infiltrate at RIGHT base.

Remaining lungs clear.

No definite pleural effusion or pneumothorax.

Bones demineralized with evidence of prior cervicothoracic fusion.
IMPRESSION: Persistent atelectasis versus infiltrate RIGHT base.

## 2017-01-19 IMAGING — CR DG CHEST 1V PORT
1 series · 1 of 1 positions shown · non-contrast
Comparison: 11/11/2015

CLINICAL DATA: Altered mental status.  Sepsis.

EXAM:
PORTABLE CHEST 1 VIEW

[AP]
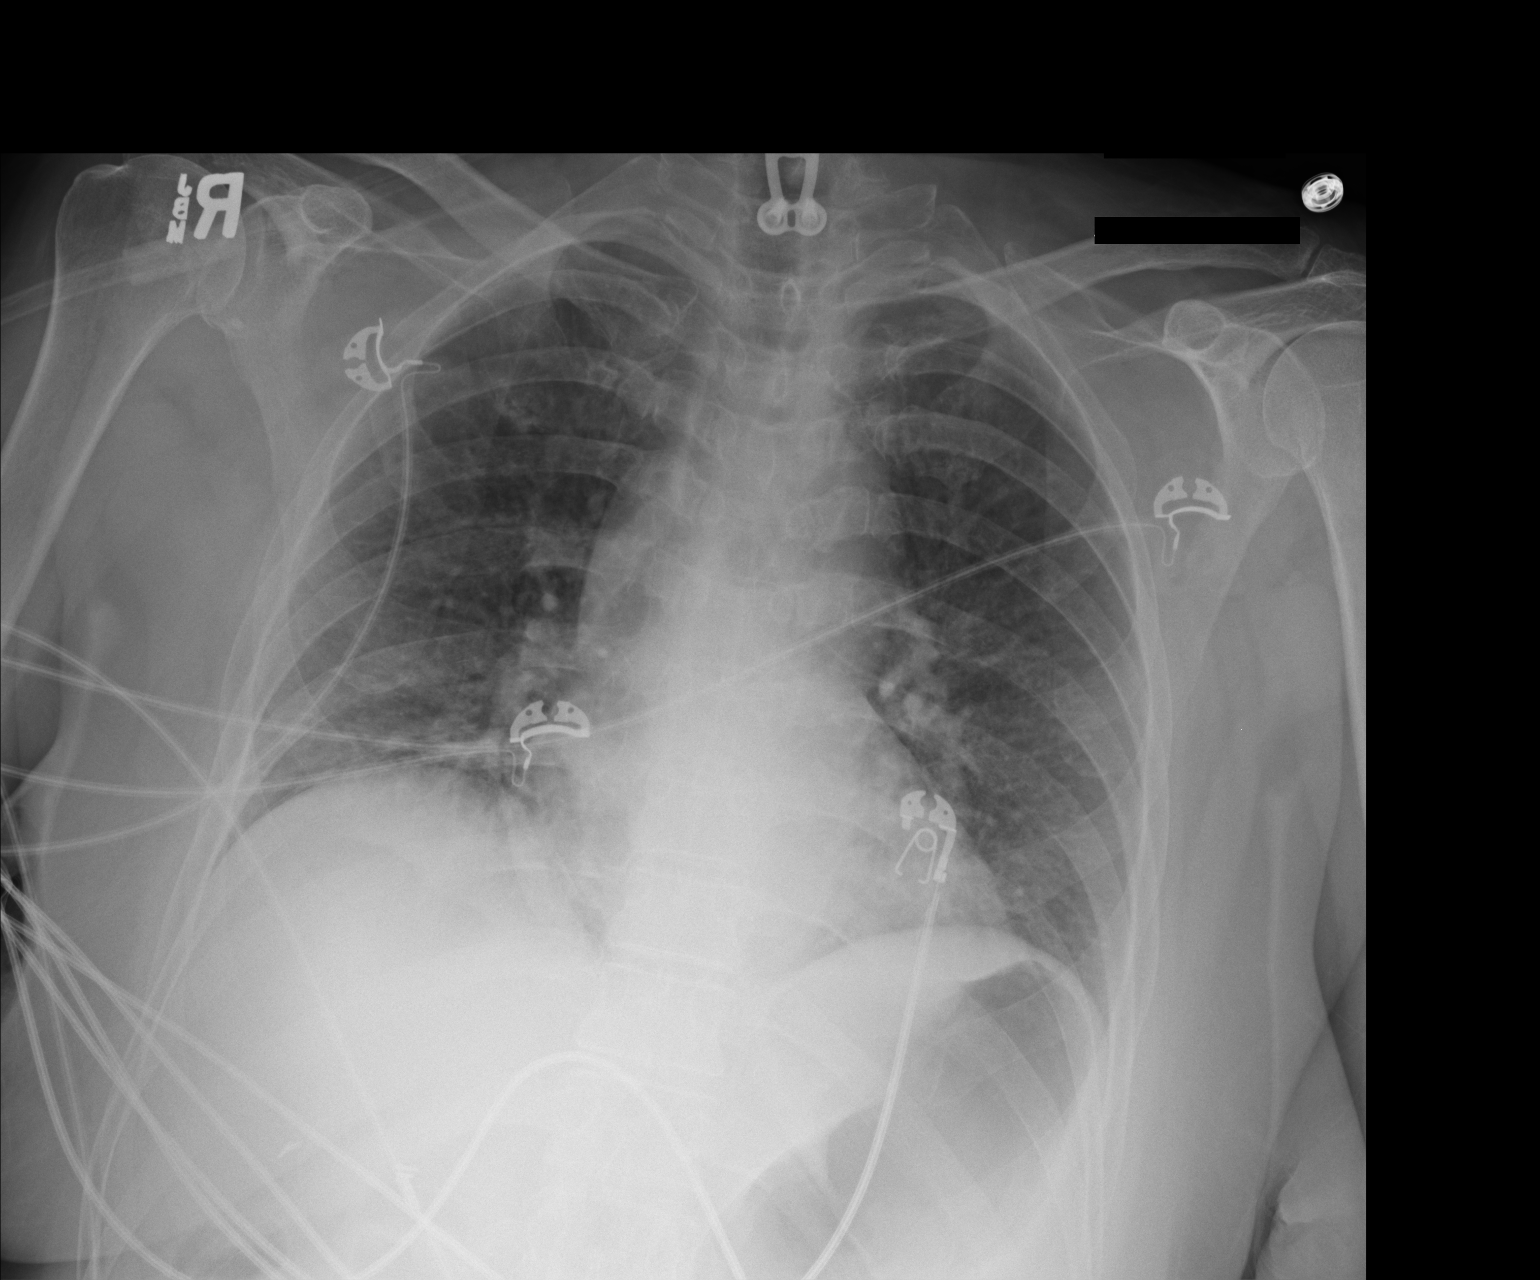

[1 of 1 positions shown; findings below may reference images not displayed]

FINDINGS: Decreased lung volumes are seen with mild bibasilar atelectasis. No
evidence of pulmonary consolidation or edema. No evidence of
pneumothorax or pleural effusion. Heart size is within normal
limits. Cervical spine fusion hardware again noted.
IMPRESSION: Decreased lung volumes with mild bibasilar atelectasis.

## 2017-01-19 IMAGING — CR DG HIP (WITH OR WITHOUT PELVIS) 1V PORT*L*
2 series · 2 of 2 positions shown · non-contrast
Comparison: None.

CLINICAL DATA: Left hip pain.

EXAM:
DG HIP (WITH OR WITHOUT PELVIS) 1V PORT LEFT

[AP (1 of 2)]
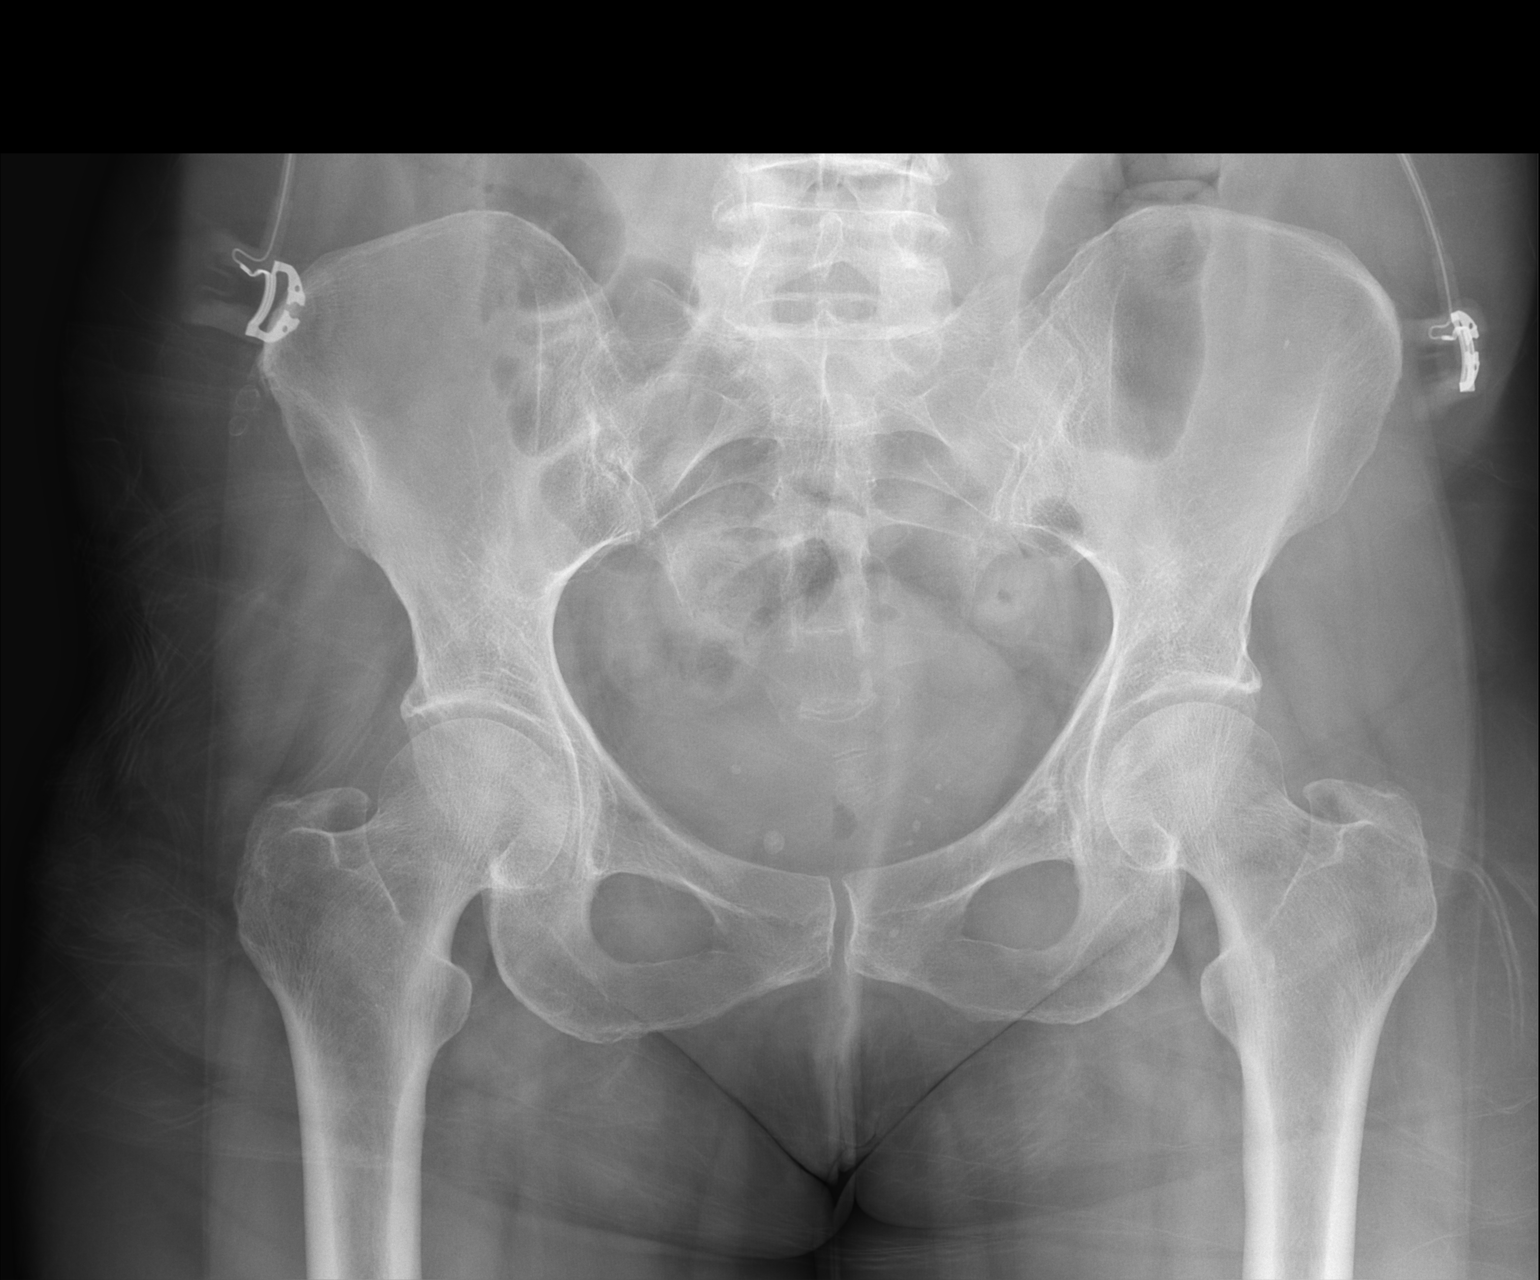

[AP (2 of 2)]
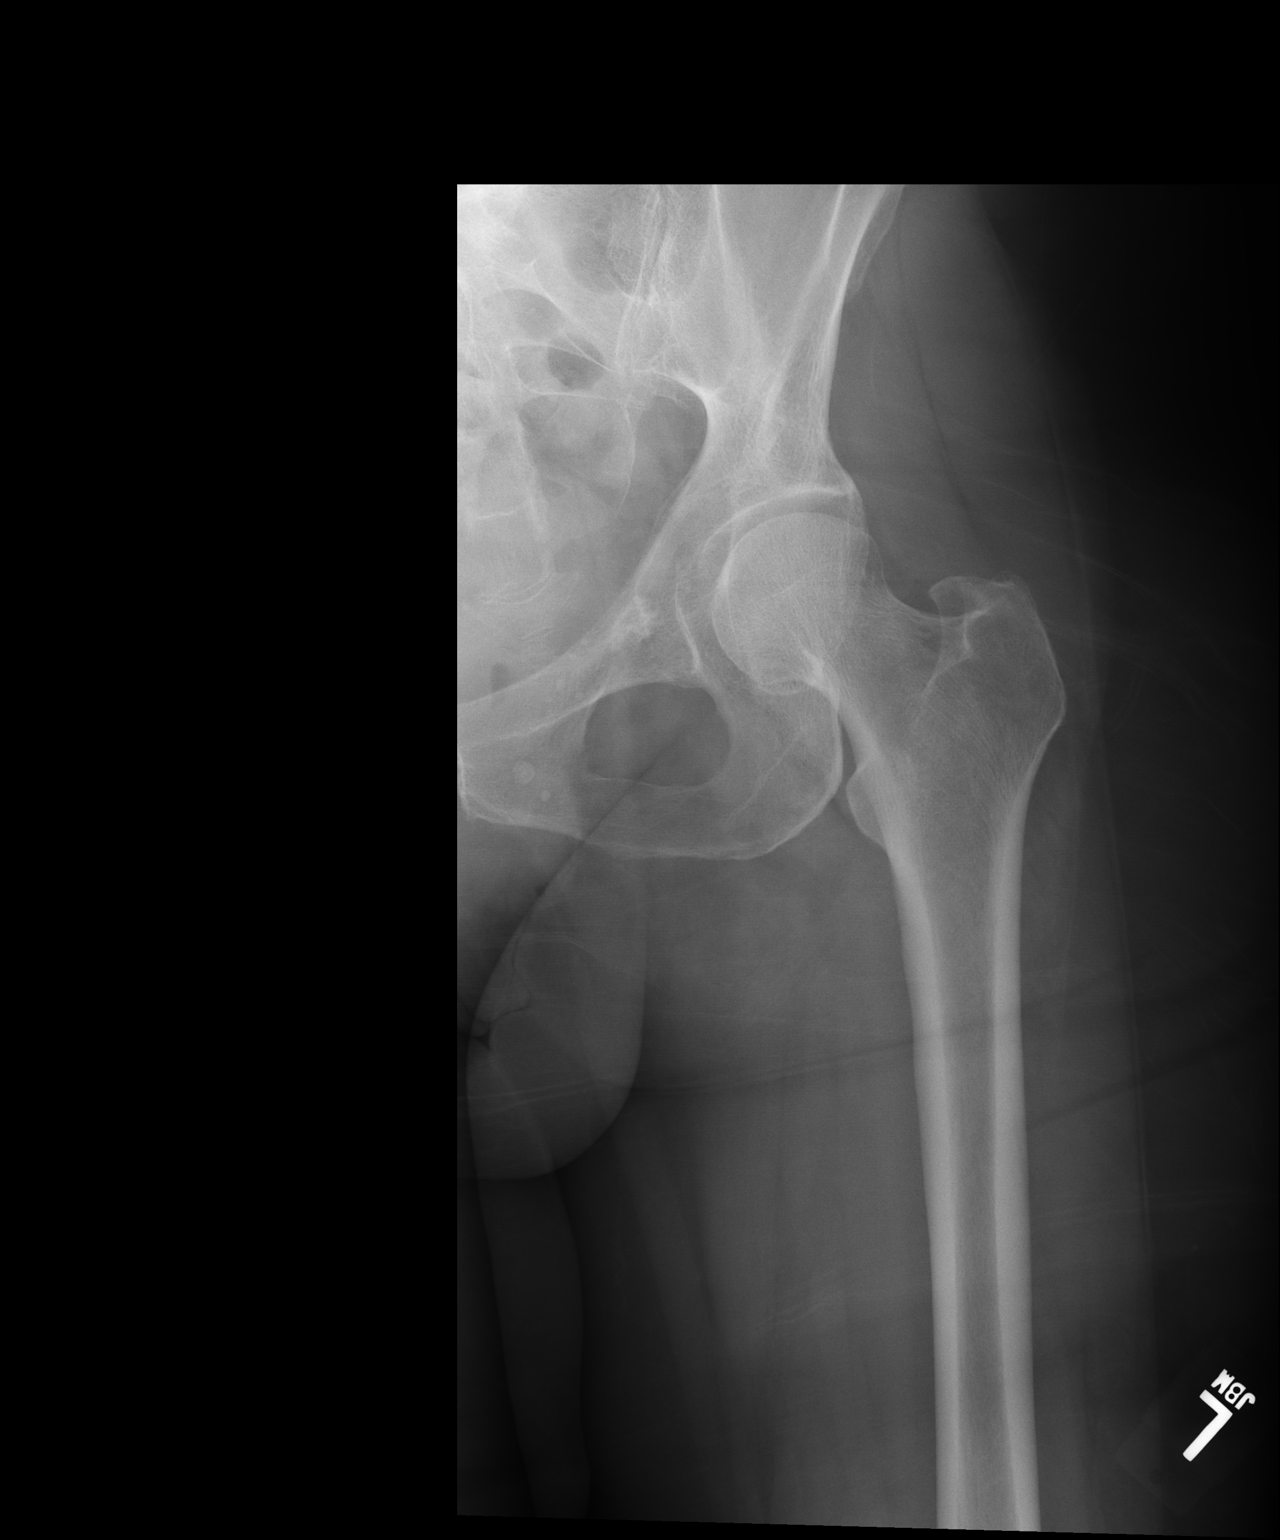

[2 of 2 positions shown; findings below may reference images not displayed]

FINDINGS: There is no evidence of hip fracture or dislocation. No pelvic
fracture identified. There is no evidence of arthropathy or other
focal bone abnormality.
IMPRESSION: Negative.

## 2017-01-20 IMAGING — MR MR LUMBAR SPINE WO/W CM
4 of 9 series · 19 of 48 positions shown · IV contrast (multihance)
Comparison: MRI pelvis 05/11/2016

CLINICAL DATA: Left leg pain. Sudden onset today. Fever and chills
last 2 days. No known injury.

EXAM:
MRI LUMBAR SPINE WITHOUT AND WITH CONTRAST
TECHNIQUE: Multiplanar and multiecho pulse sequences of the lumbar spine were
obtained without and with intravenous contrast.
CONTRAST:  17 mL MultiHance IV

[Series 2: T2 · sagittal · 4.0mm · 0.55mm/px · 3 of 13 slices shown (1 of 2)]
[im 1/13]
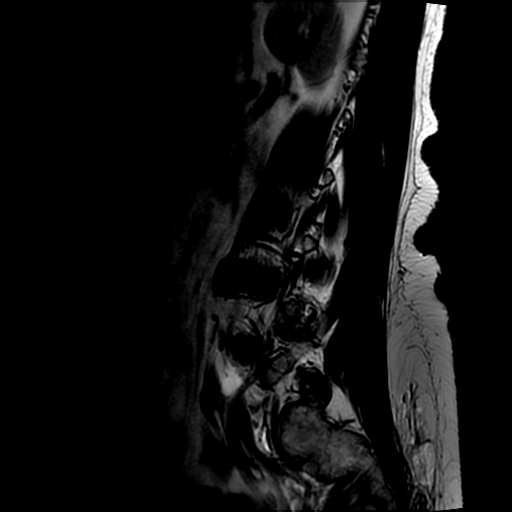
[im 7/13]
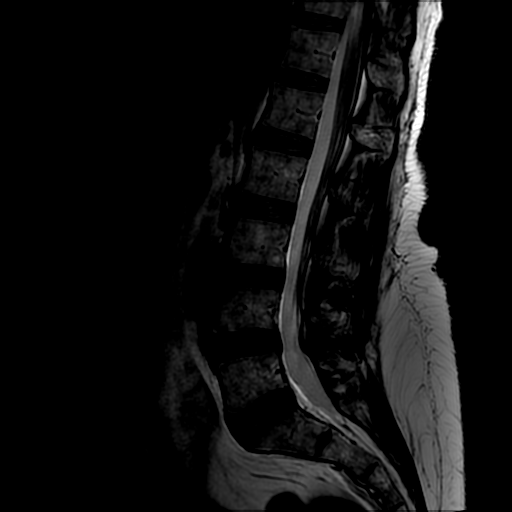
[im 13/13]
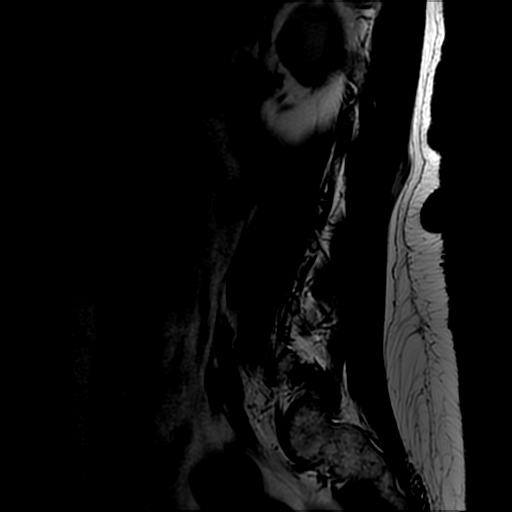

[Series 4: T1 · sagittal · 4.0mm · 0.55mm/px · 3 of 13 slices shown (1 of 2)]
[im 1/13]
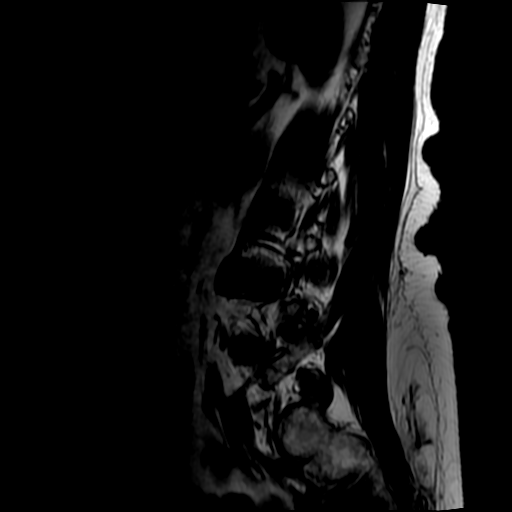
[im 7/13]
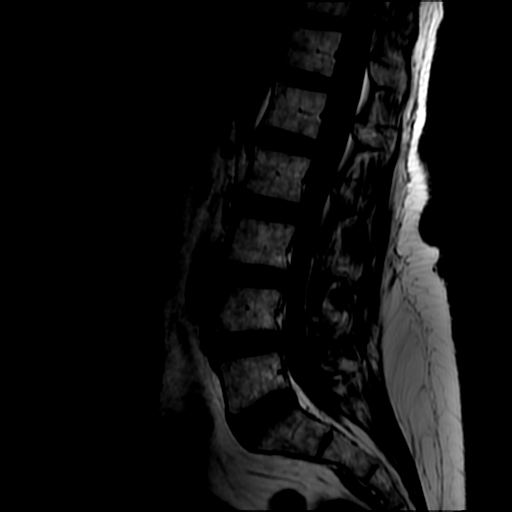
[im 13/13]
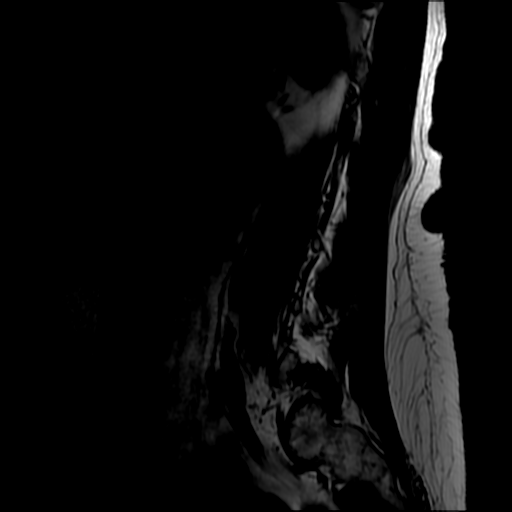

[Series 5: T2 · axial · 4.0mm · 0.39mm/px · z∈[-11,+216]mm · 9 of 41 slices shown (2 of 2)]
[im 1/41]
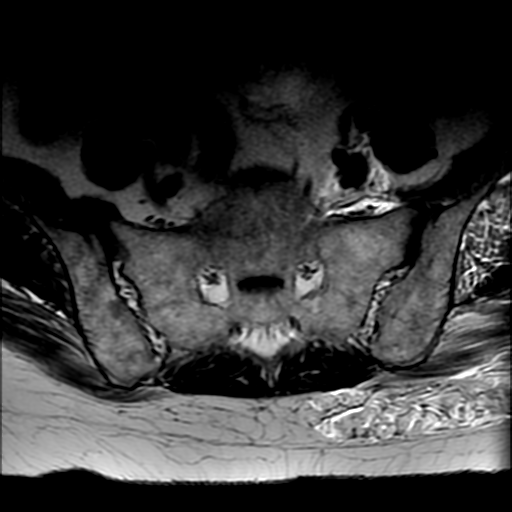
[im 6/41]
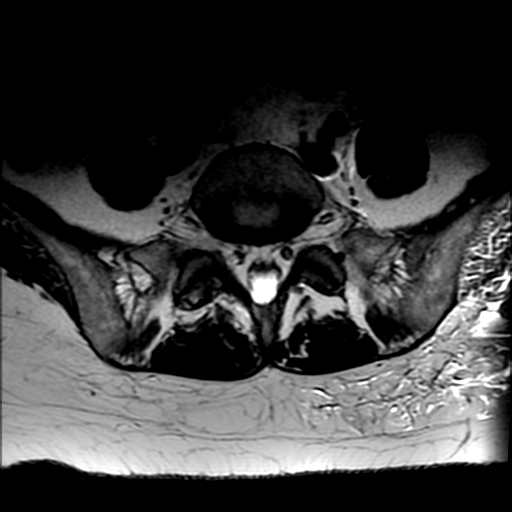
[im 11/41]
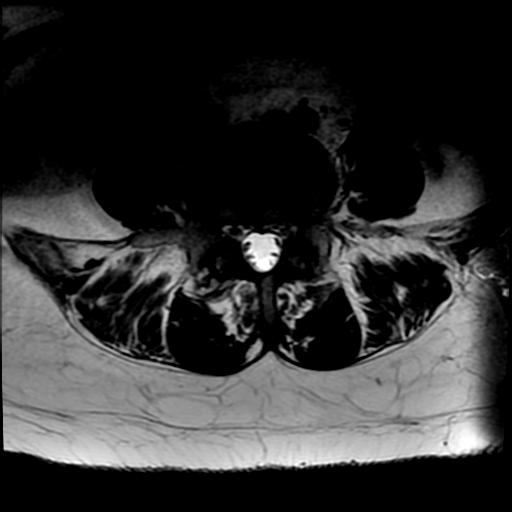
[im 16/41]
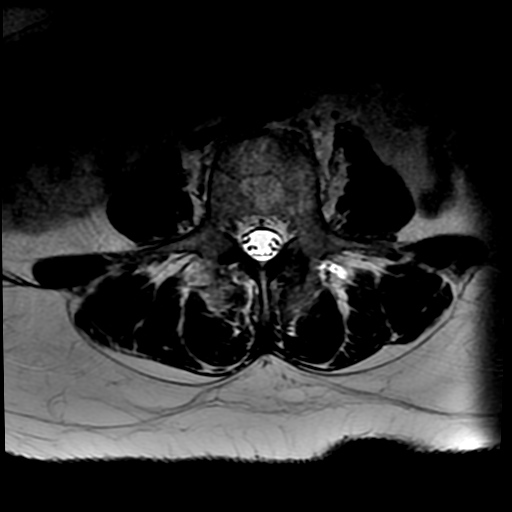
[im 21/41]
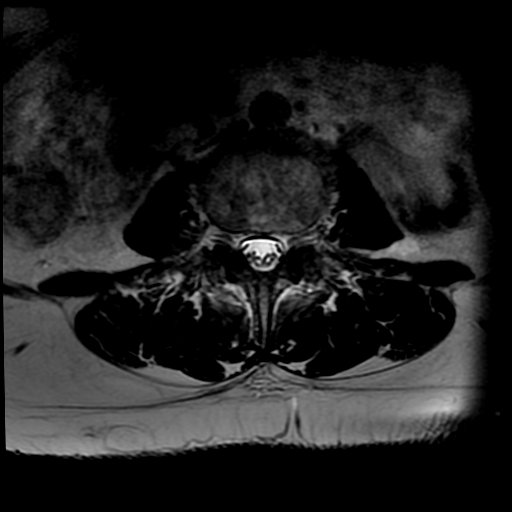
[im 26/41]
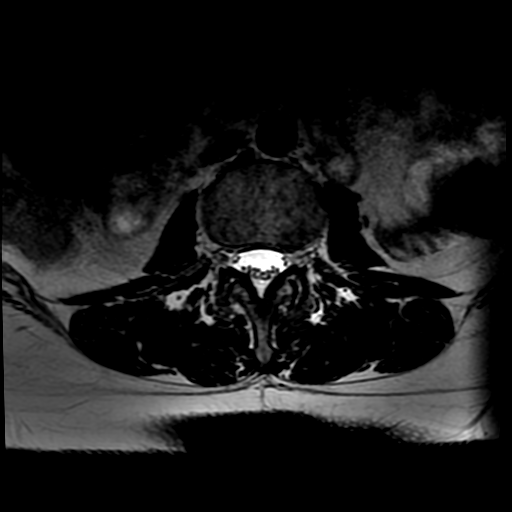
[im 31/41]
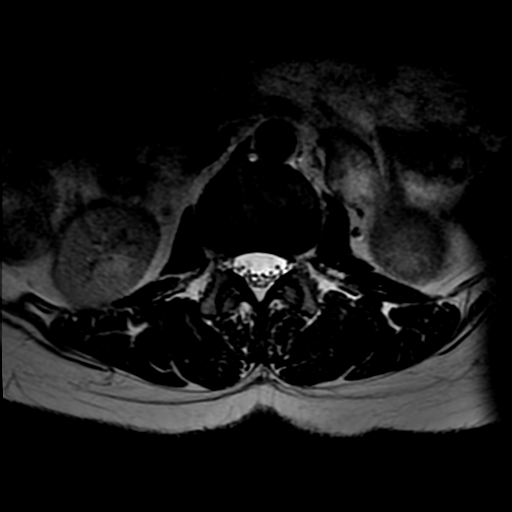
[im 36/41]
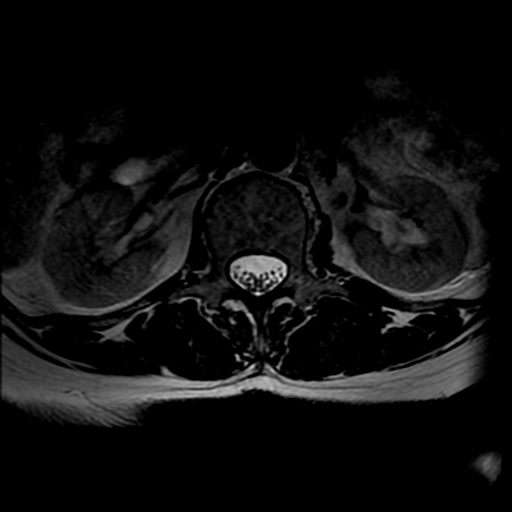
[im 41/41]
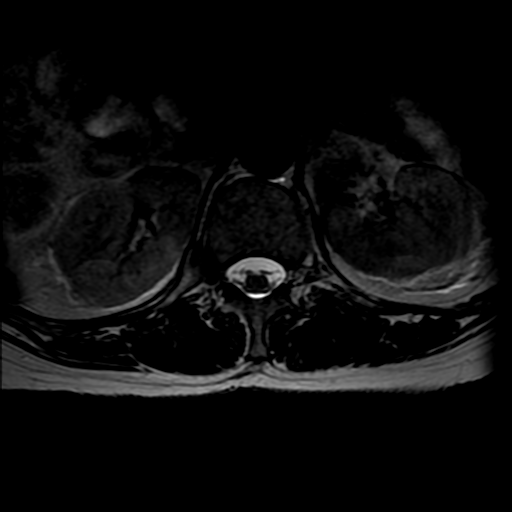

[Series 6: T1 · axial · 4.0mm · 0.39mm/px · z∈[-11,+192]mm · 4 of 41 slices shown (2 of 2)]
[im 1/41]
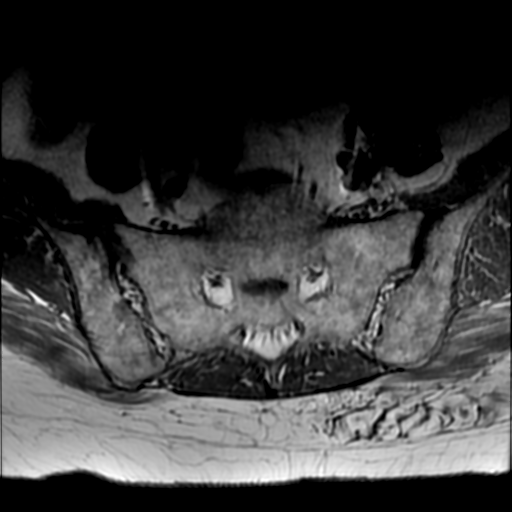
[im 6/41]
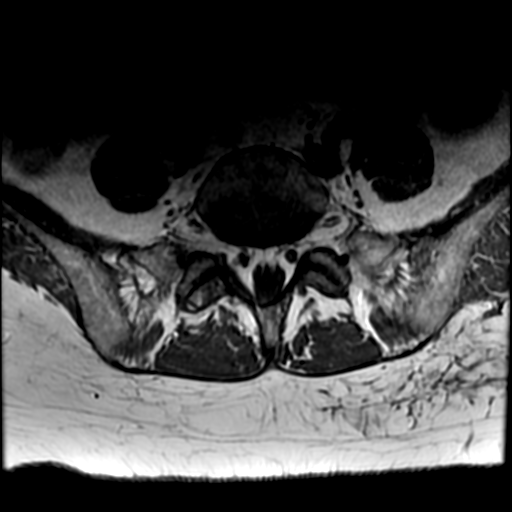
[im 21/41]
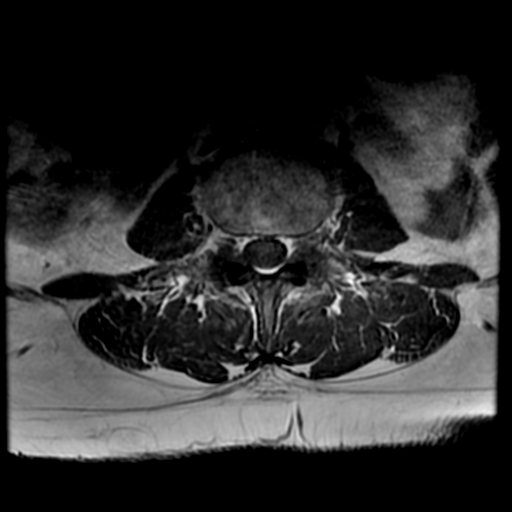
[im 36/41]
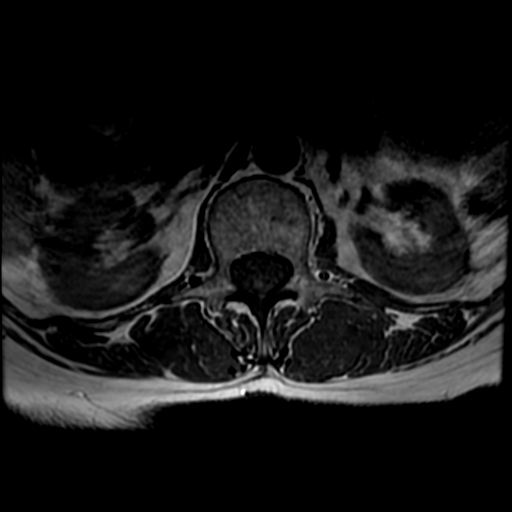

[19 of 48 positions shown; findings below may reference images not displayed]

FINDINGS: Segmentation:  Normal segmentation

Alignment:  Normal alignment

Vertebrae: Negative for fracture or mass. Normal bone marrow. No
enhancing lesion in the spine.

Conus medullaris: Extends to the L1-2 level and appears normal.

Paraspinal and other soft tissues: Paraspinous muscles normal. No
retroperitoneal mass or abscess. No hydronephrosis.

Extensive edema and enhancement in the muscles around the left hip
including the gluteus muscles and operator muscle. Additional muscle
edema and enhancement is noted on the pelvic MRI. See pelvic MRI
report from today. Findings findings most consistent with myositis.

Disc levels:

L1-2:  Negative

L2-3:  Negative

L3-4: Mild disc and facet degeneration. No significant spinal
stenosis

L4-5: Mild disc bulging. Small central disc protrusion. Moderate
facet and ligamentum flavum hypertrophy. Mild spinal stenosis.

L5-S1:  Negative
IMPRESSION: Mild lumbar degenerative change. Mild spinal stenosis at L4-5. No
evidence of abscess or acute abnormality of the lumbar spine

Extensive muscle edema enhancement around the left buttock and thigh
region compatible with myositis. See MRI pelvis report from today

## 2017-01-20 IMAGING — MR MR HIP*L* WO/W CM
5 of 10 series · 18 of 40 positions shown · IV contrast (Yes MH)
Comparison: Radiographs 05/10/2017

CLINICAL DATA: Woke up this morning with severe left-sided back,
left hip and buttock pain. No known trauma.

EXAM:
MRI OF THE LEFT HIP WITHOUT AND WITH CONTRAST
TECHNIQUE: Multiplanar, multisequence MR imaging was performed both before and
after administration of intravenous contrast.
CONTRAST:  1 MULTIHANCE GADOBENATE DIMEGLUMINE 529 MG/ML IV SOLN

[Series 4: T1 · coronal · 3.5mm · 0.82mm/px · 2 of 32 slices shown (1 of 2)]
[im 1/32]
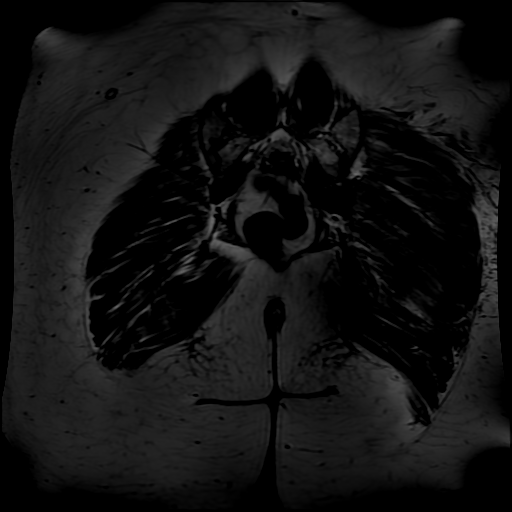
[im 32/32]
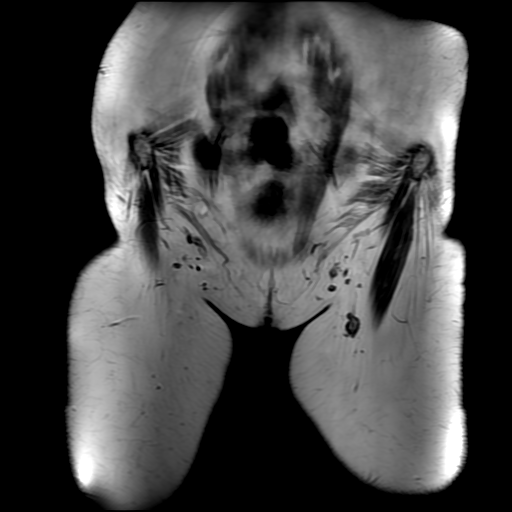

[Series 6: T2 fat-sat · axial · 3.5mm · 0.82mm/px · z∈[-279,+36]mm · 5 of 64 slices shown (1 of 2)]
[im 1/64]
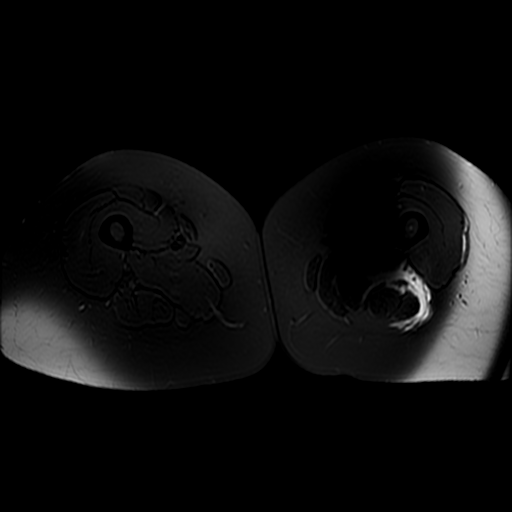
[im 16/64]
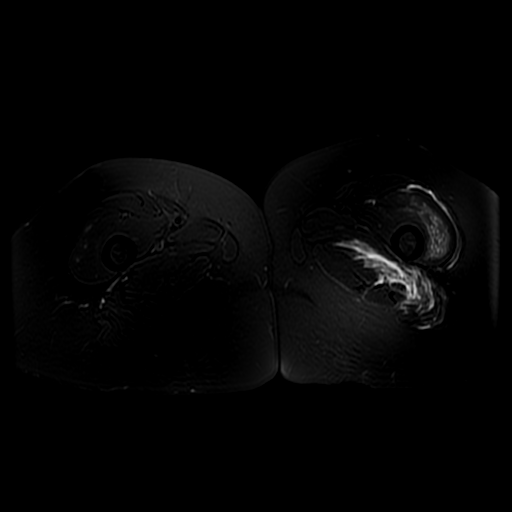
[im 32/64]
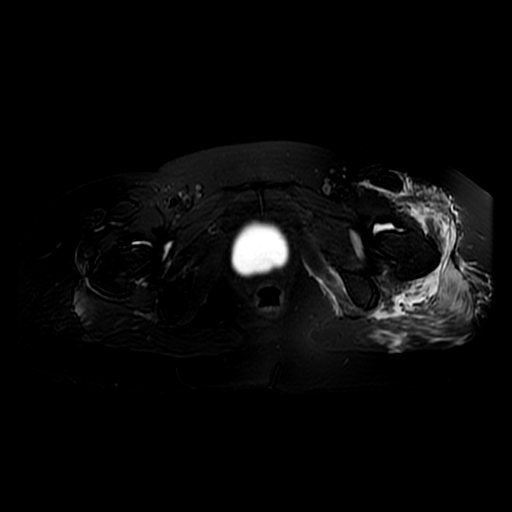
[im 48/64]
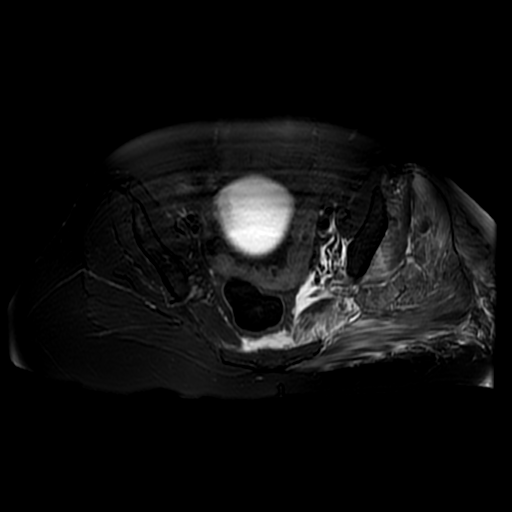
[im 64/64]
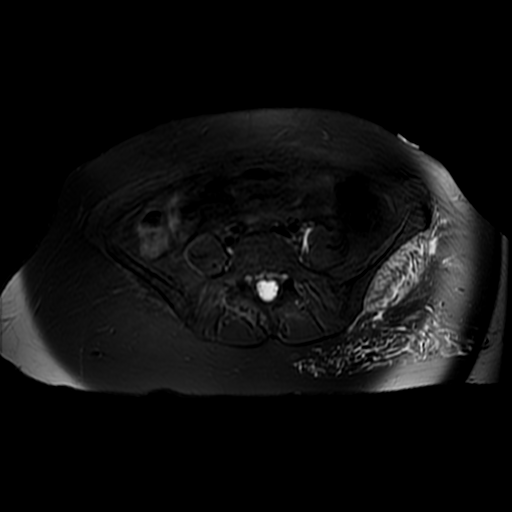

[Series 7: T1 · axial · 3.5mm · 0.82mm/px · 1 of 16 slices shown (2 of 2)]
[im 1/16]
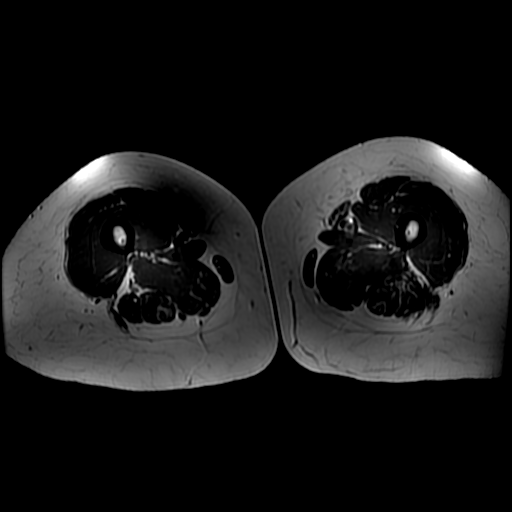

[Series 11: T2 fat-sat · axial · 3.5mm · 0.82mm/px · z∈[-376,+107]mm · 7 of 89 slices shown (2 of 2)]
[im 1/89]
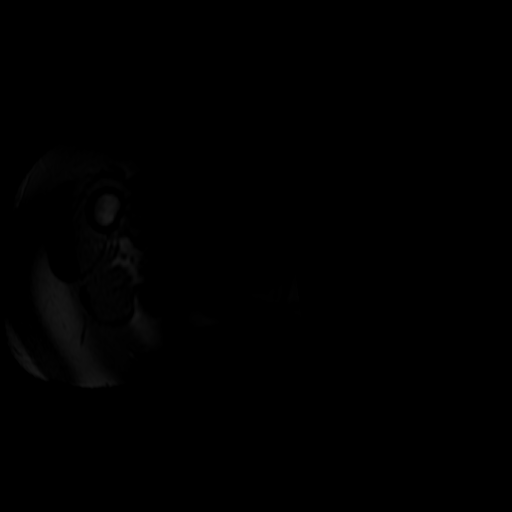
[im 15/89]
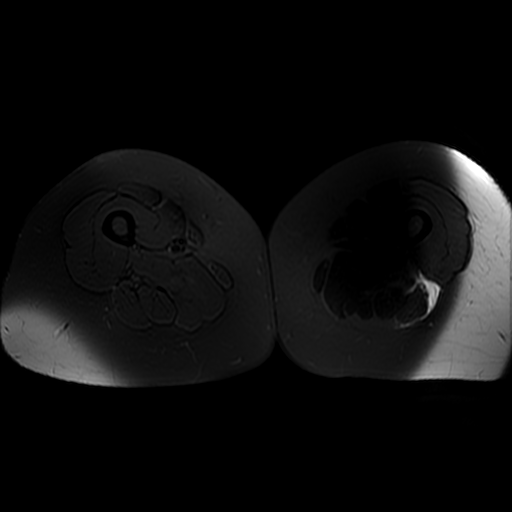
[im 30/89]
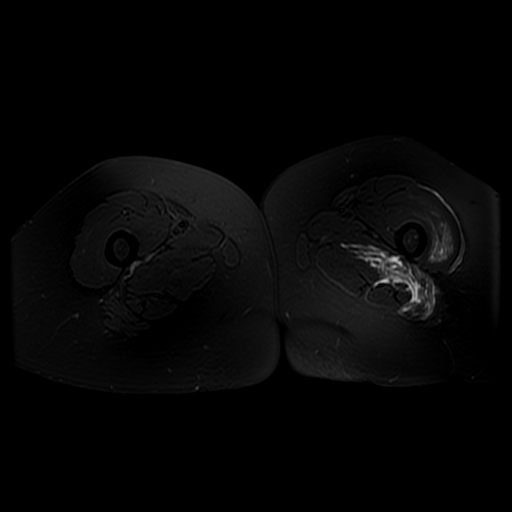
[im 45/89]
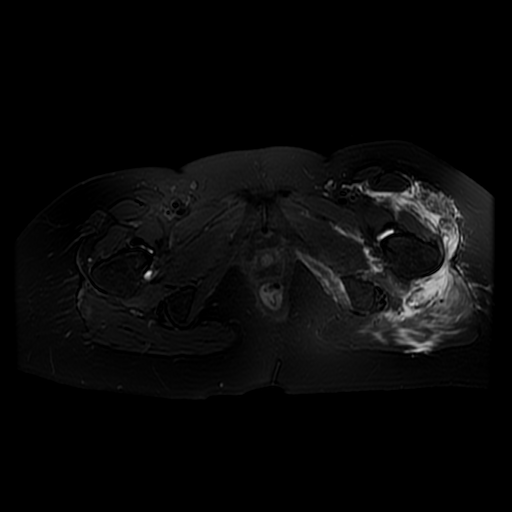
[im 59/89]
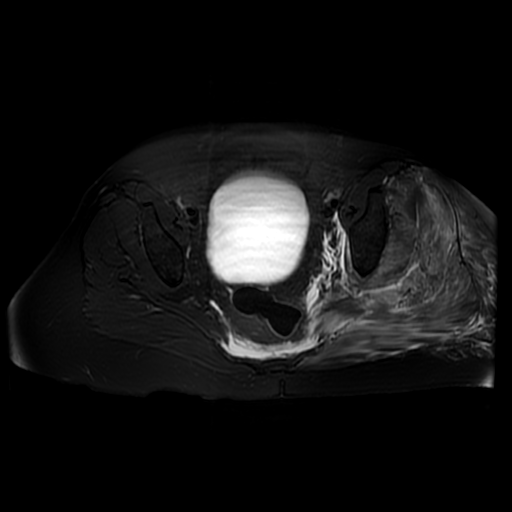
[im 74/89]
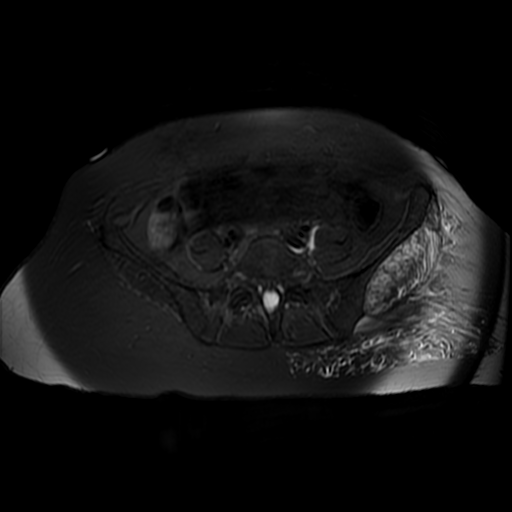
[im 89/89]
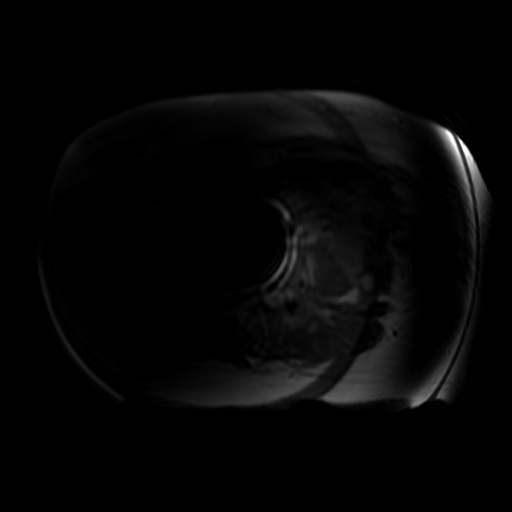

[Series 14: PD fat-sat · sagittal · 4.0mm · 0.35mm/px · 3 of 33 slices shown]
[im 1/33]
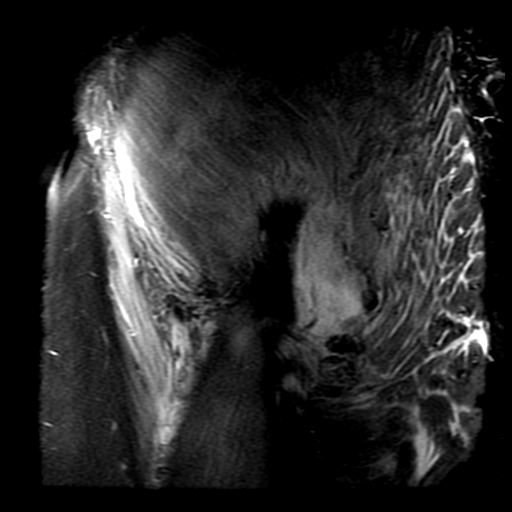
[im 17/33]
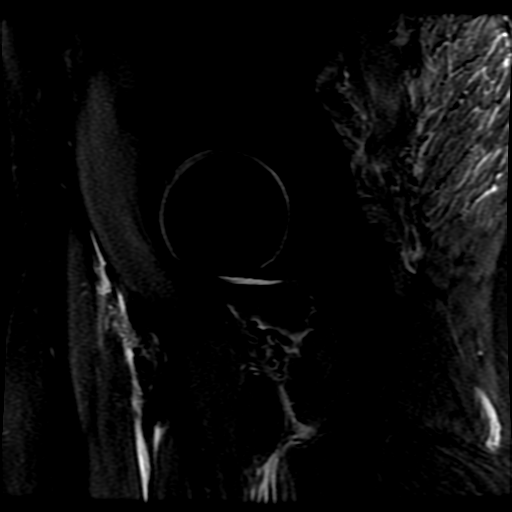
[im 33/33]
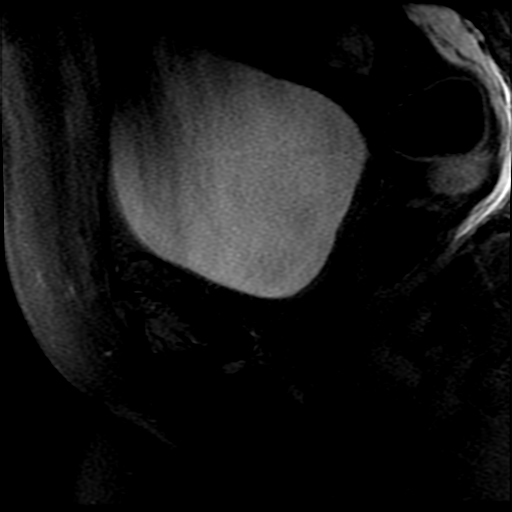

[18 of 40 positions shown; findings below may reference images not displayed]

FINDINGS: Bones: No acute bony findings. No fracture for marrow edema. Both
hips are normally located. No findings for septic arthritis or
osteomyelitis. No stress fracture or AVN.

The pubic symphysis and SI joints are intact. No findings for septic
arthritis.

Joint or bursal effusion

Joint effusion:  No hip joint effusion.

Bursae:  No findings for trochanteric bursitis.

Muscles and tendons

Muscles and tendons: There is severe and diffuse edema like signal
abnormality in the muscles of the left pelvis and hip. All 3 gluteal
muscles are involved along with involvement of the upper vastus
lateralis muscle and the upper left hamstring muscles. There is also
involvement of the obturator externus and the piriformis muscle on
the left side. There is also fluid tracking between the muscles and
there is left-sided subcutaneous soft tissue swelling/ edema/ fluid.
There is also presacral edema and a small amount of extraperitoneal
pelvic fluid on the left side which is tracking up along the iliacus
and psoas muscles.

The only findings on the right side are mild edema in the gluteus
maximus muscle.

Other findings

Miscellaneous: No significant intrapelvic findings other than the
small amount of fluid. No pelvic mass or adenopathy. No inguinal
mass or adenopathy.
IMPRESSION: Severe myofasciitis involving the left hip and pelvic musculature.
This is most likely infectious given the patient's symptoms of
fever, elevated white blood count and no history of trauma. No
discrete drainable soft tissue abscess is identified. There is free
extraperitoneal fluid in the pelvis.

No MR findings to suggest septic arthritis or osteomyelitis.

## 2017-02-03 IMAGING — CR DG CHEST 2V
2 series · 2 of 2 positions shown · non-contrast
Comparison: 05/10/2016

CLINICAL DATA: Shortness of breath and left leg pain for 2 weeks.

EXAM:
CHEST  2 VIEW

[w chest lat]
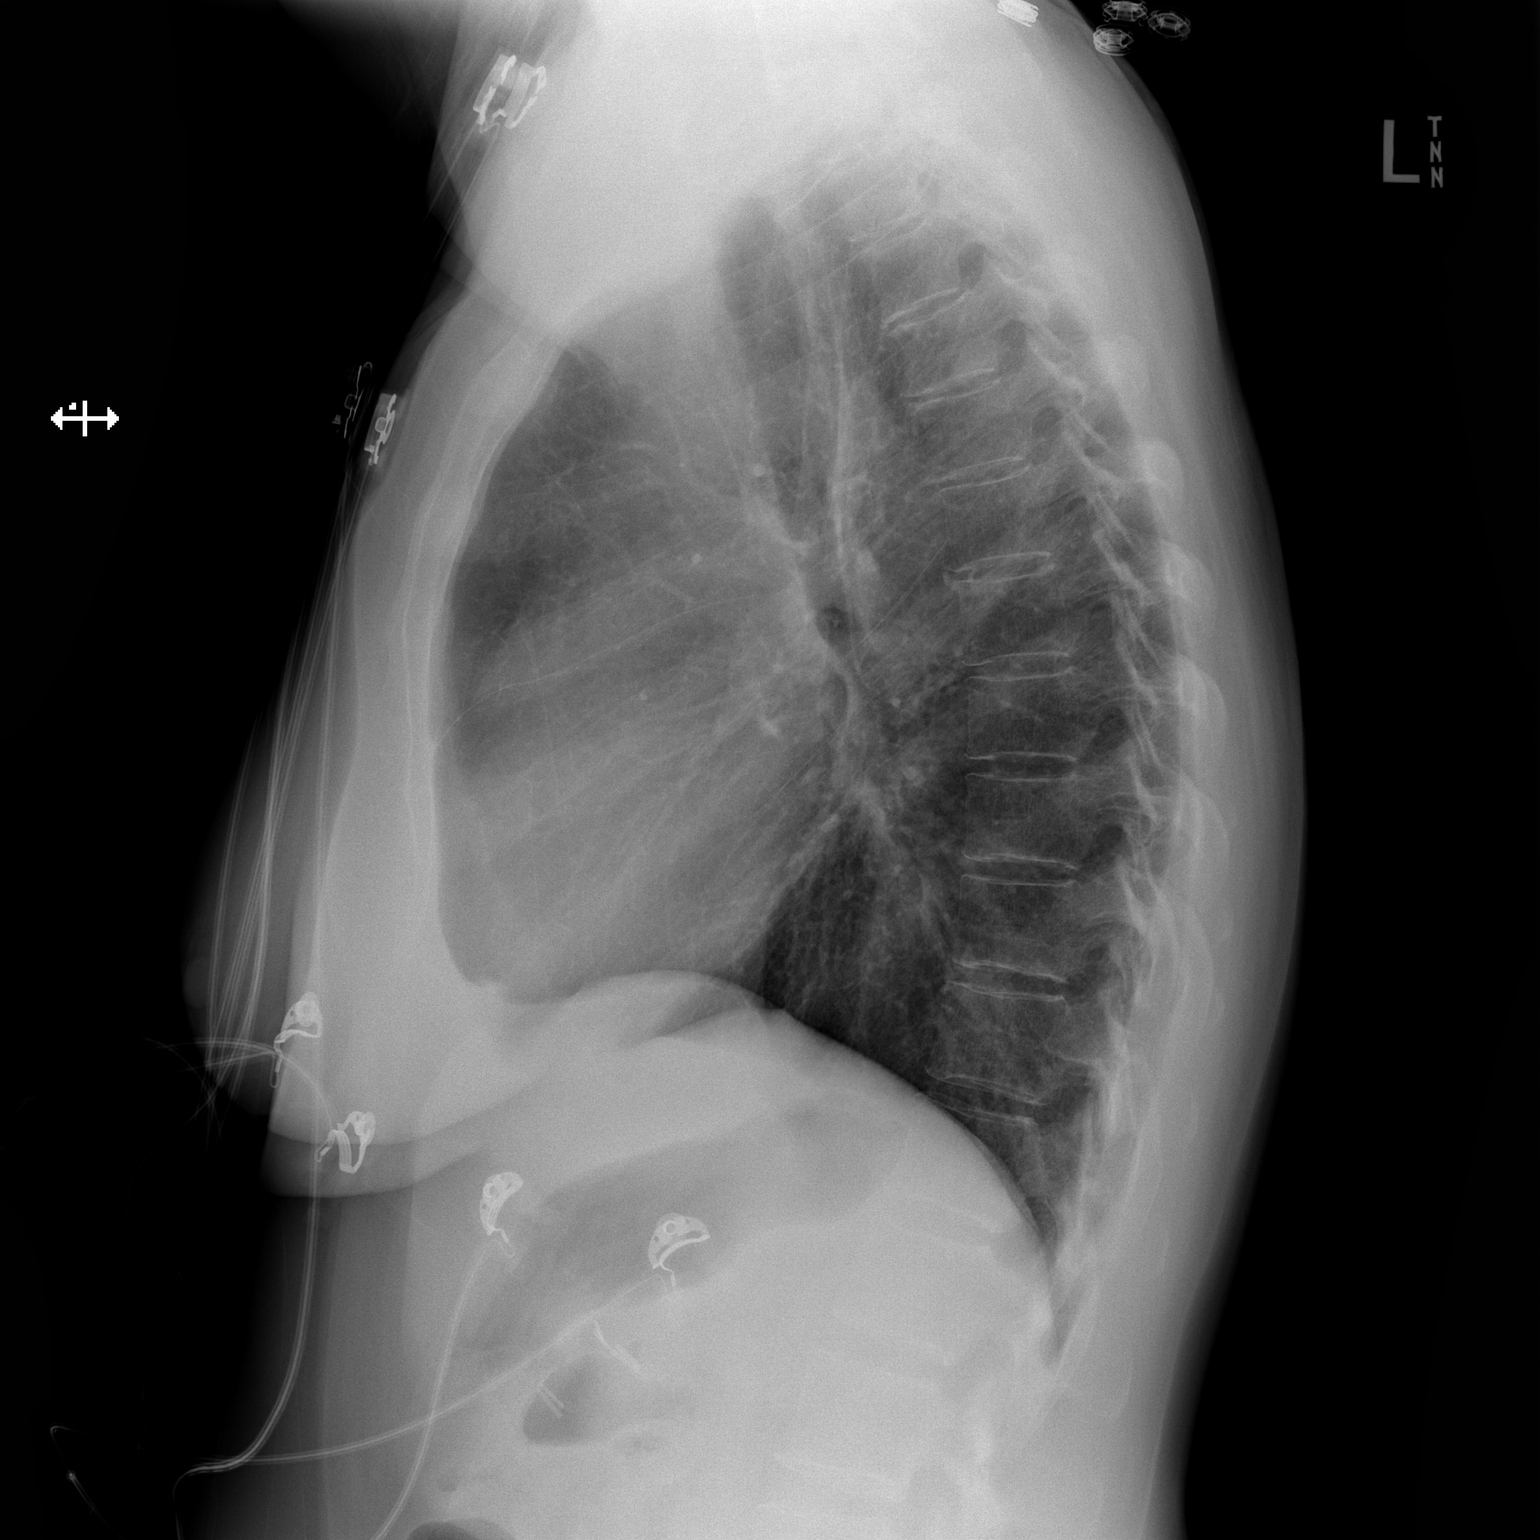

[w chest pa]
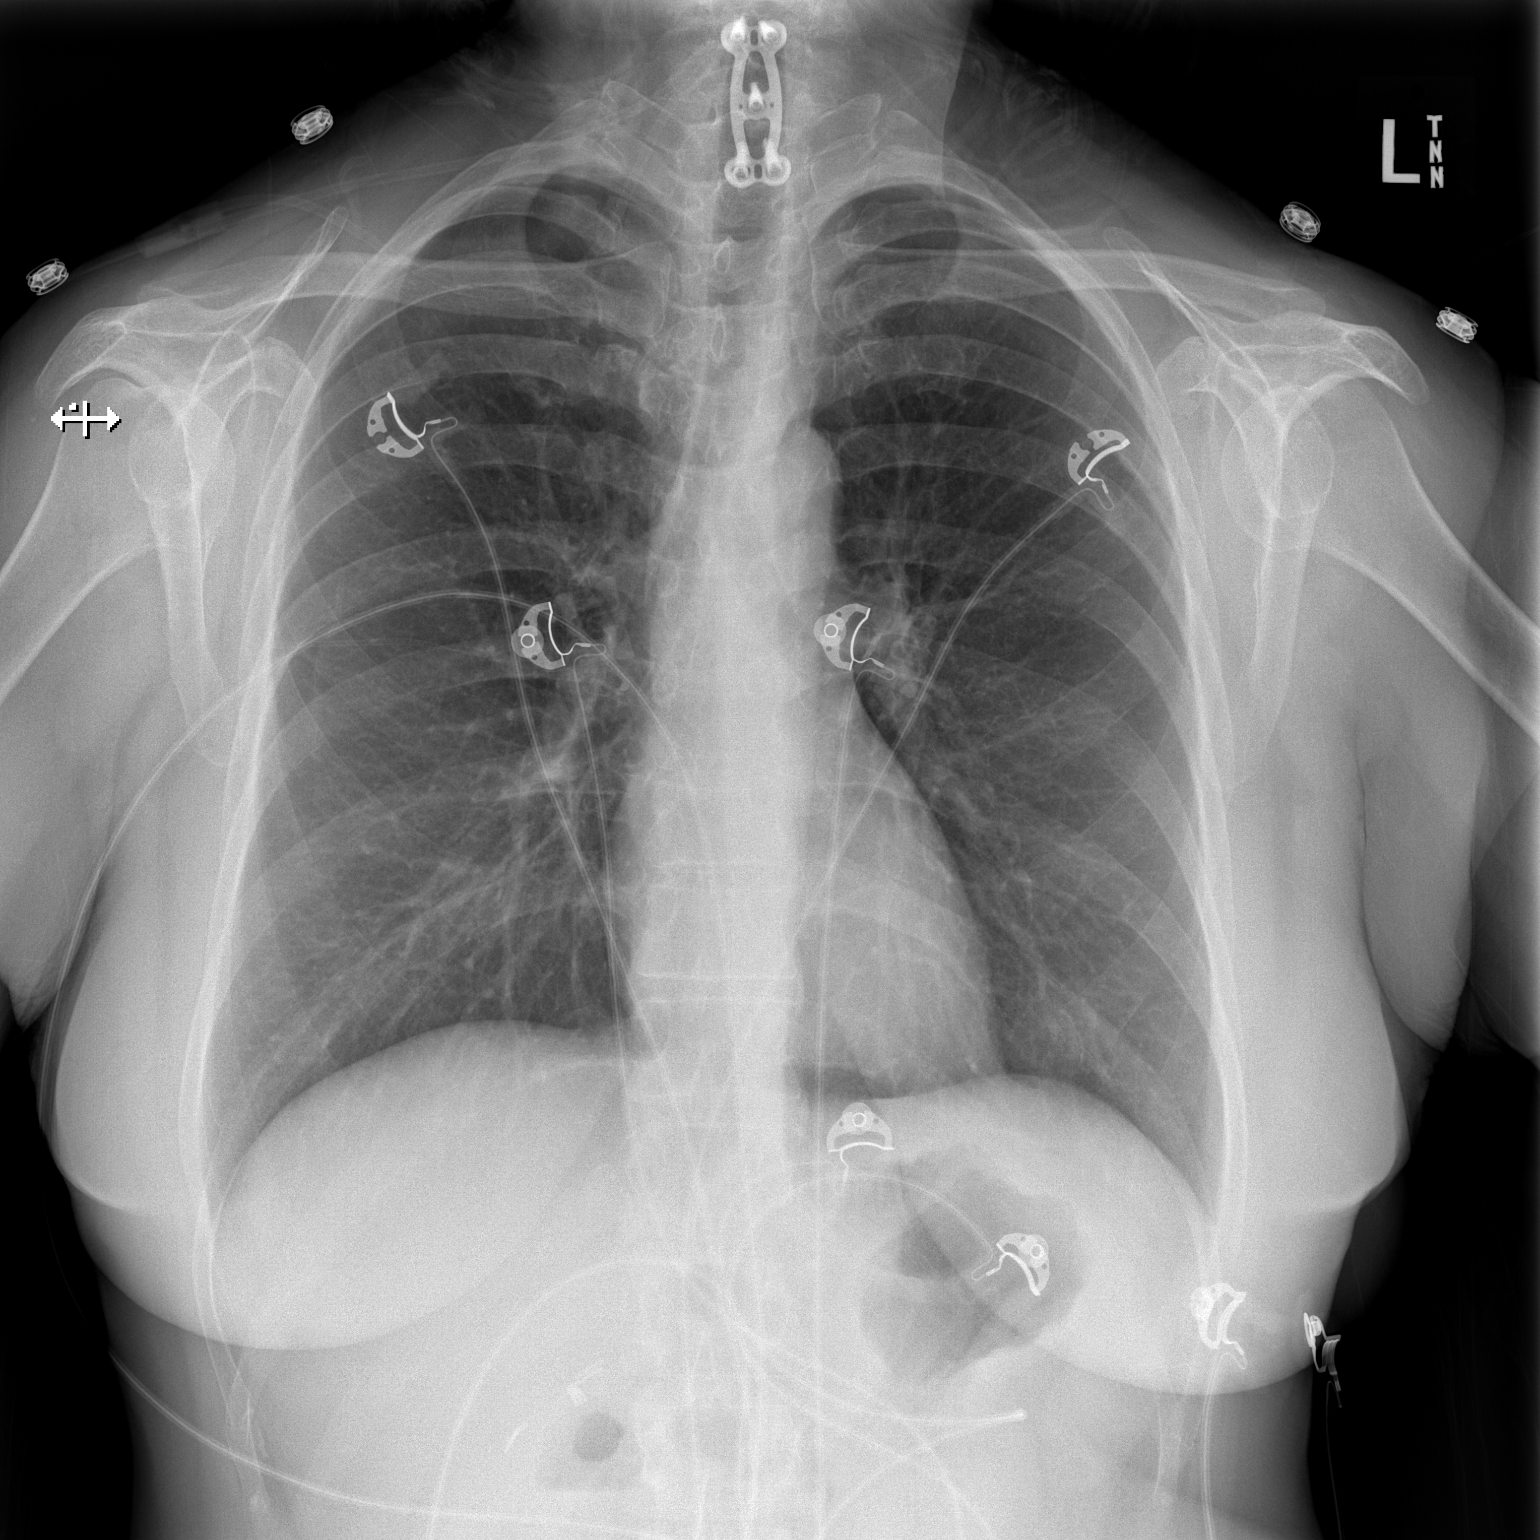

[2 of 2 positions shown; findings below may reference images not displayed]

FINDINGS: Both lungs are clear without pulmonary edema or focal airspace
disease. Heart and mediastinum are within normal limits. The trachea
is midline. Again noted is a surgical plate in the lower cervical
spine. No acute bone abnormality. No large pleural effusions.
IMPRESSION: No active cardiopulmonary disease.

## 2017-02-03 IMAGING — CT CT ANGIO CHEST
2 of 6 series · 19 of 36 positions shown · IV contrast (ISOVUE 370)
Comparison: None.

CLINICAL DATA: Shortness of breath, and low oxygen saturation.

EXAM:
CT ANGIOGRAPHY CHEST WITH CONTRAST
TECHNIQUE: Multidetector CT imaging of the chest was performed using the
standard protocol during bolus administration of intravenous
contrast. Multiplanar CT image reconstructions and MIPs were
obtained to evaluate the vascular anatomy.
CONTRAST:  Isovue 370, 100 mL.

[Series 5: coronal mpr · coronal · 0.50mm/px · 1 of 128 slices shown]
[im 64/128  mediastinal]
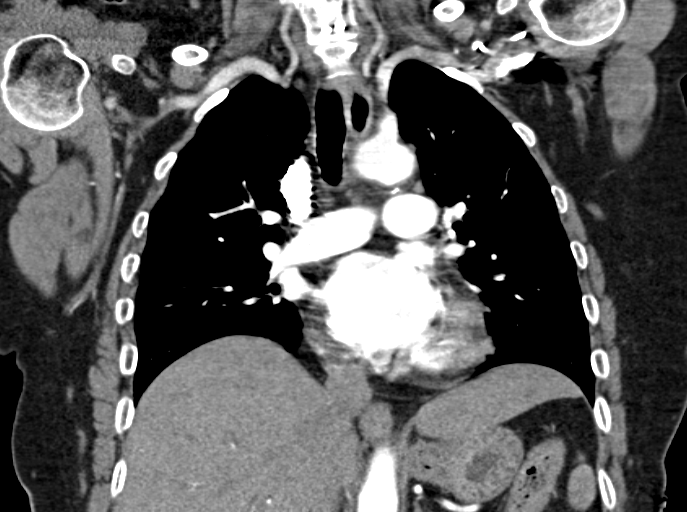

[Series 10: thins for pacs · axial · 0.74mm/px · z∈[+1458,+1691]mm · 18 of 323 slices shown]
[im 16/323  lung]
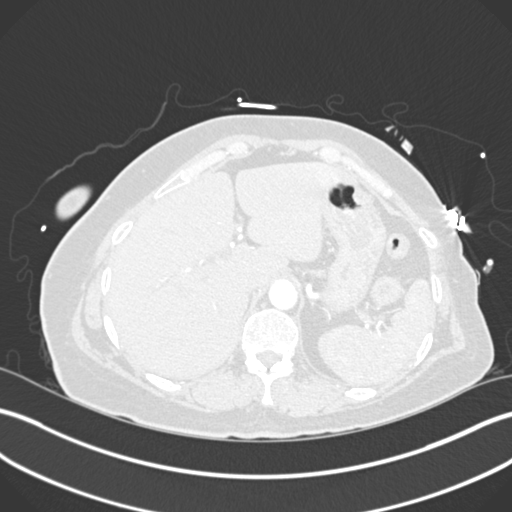
[im 31/323  mediastinal]
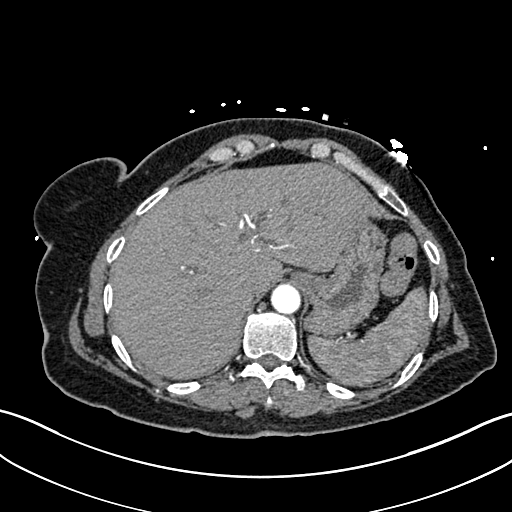
[im 47/323  lung]
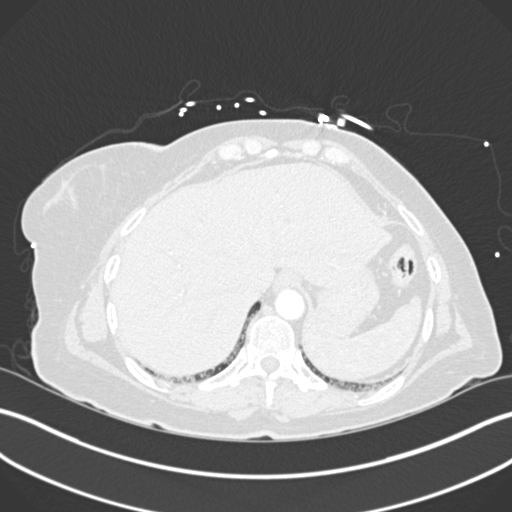
[im 62/323  mediastinal]
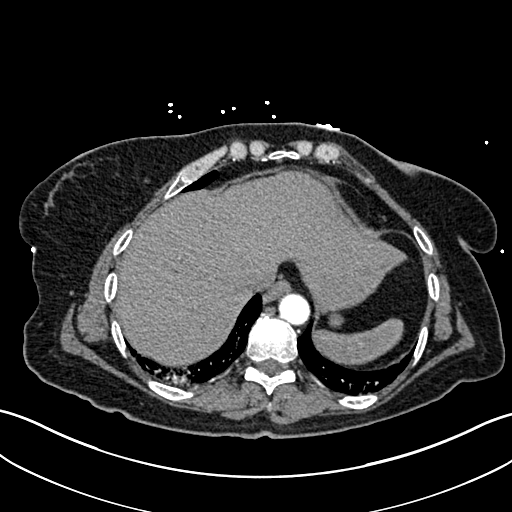
[im 93/323  lung]
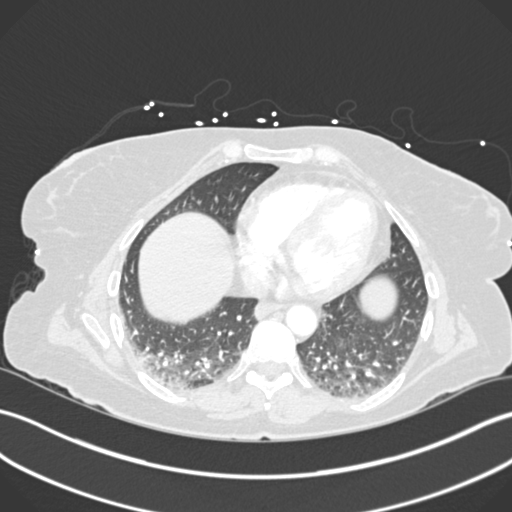
[im 108/323  mediastinal]
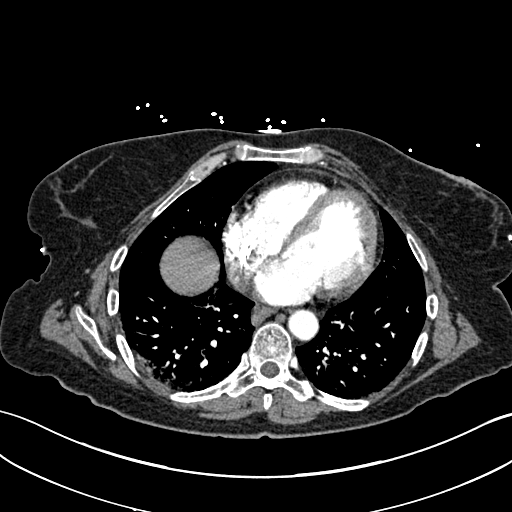
[im 123/323  lung]
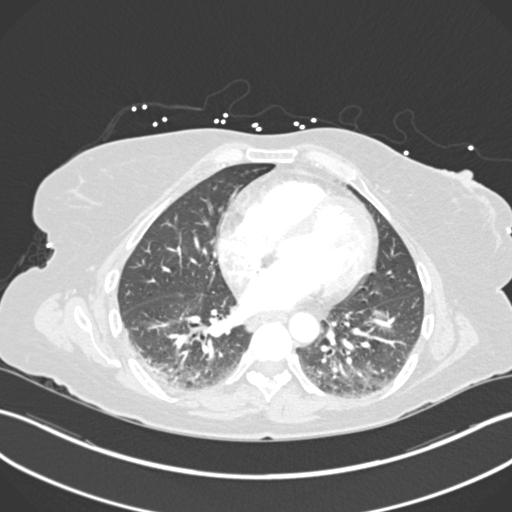
[im 139/323  mediastinal]
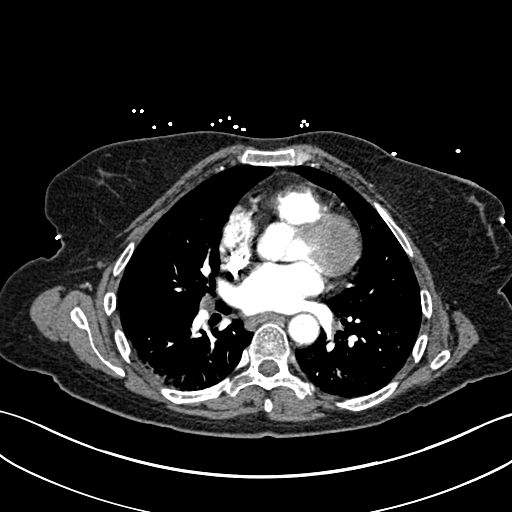
[im 154/323  lung]
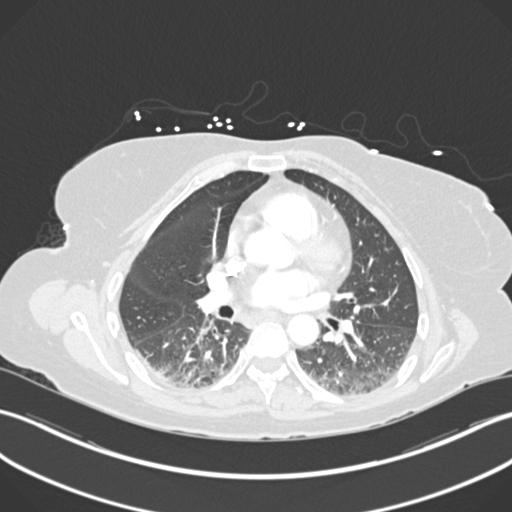
[im 169/323  mediastinal]
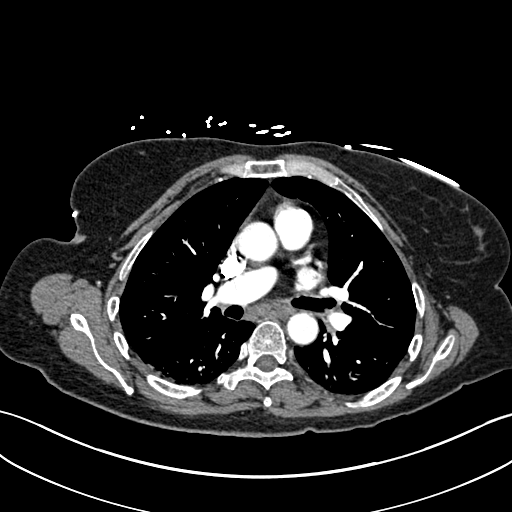
[im 185/323  lung]
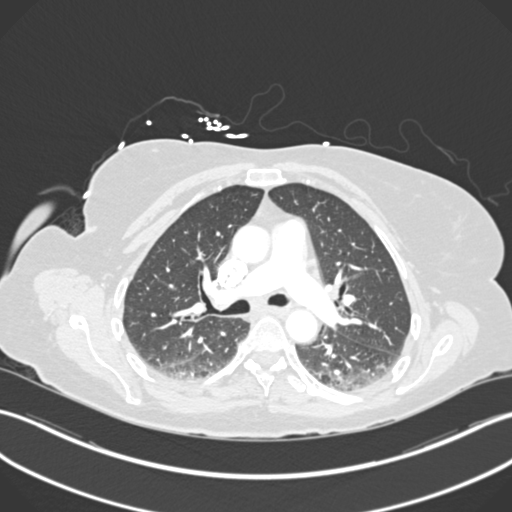
[im 200/323  mediastinal]
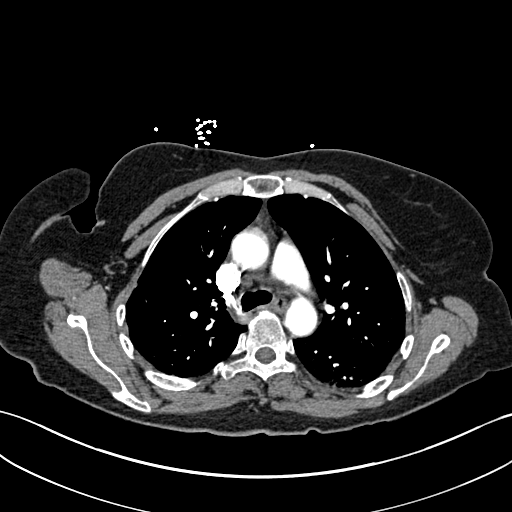
[im 215/323  lung]
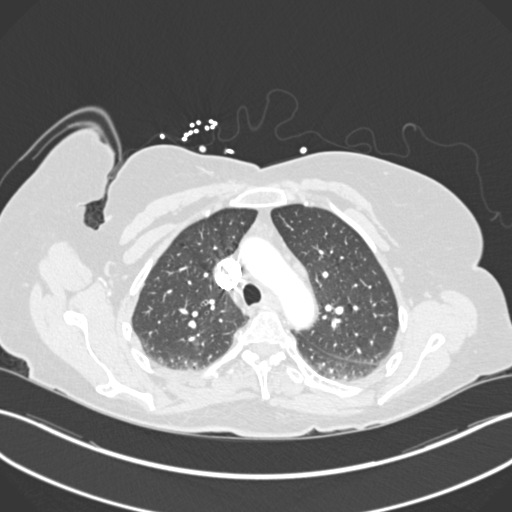
[im 246/323  mediastinal]
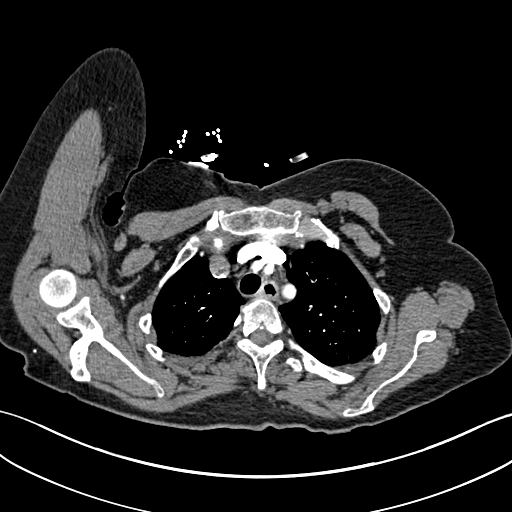
[im 261/323  lung]
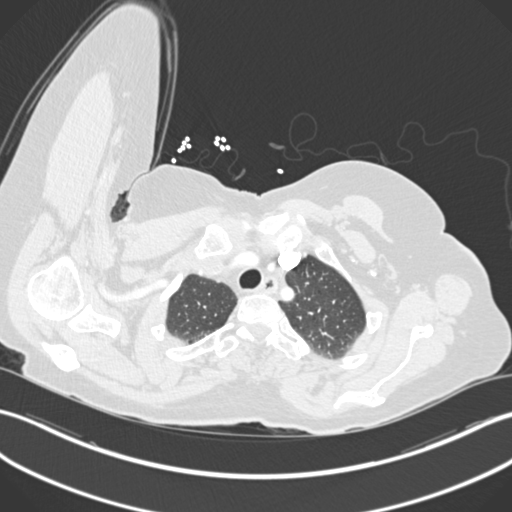
[im 277/323  mediastinal]
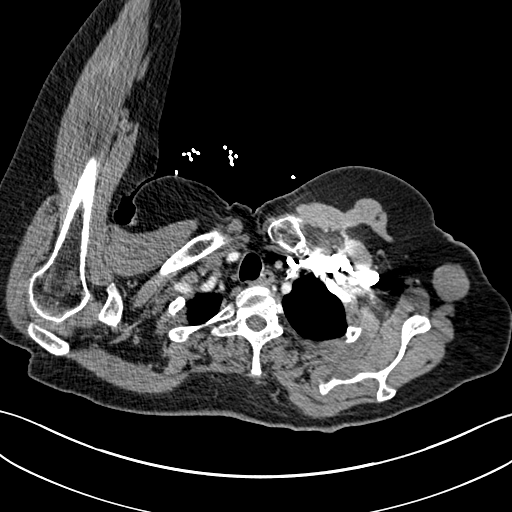
[im 292/323  lung]
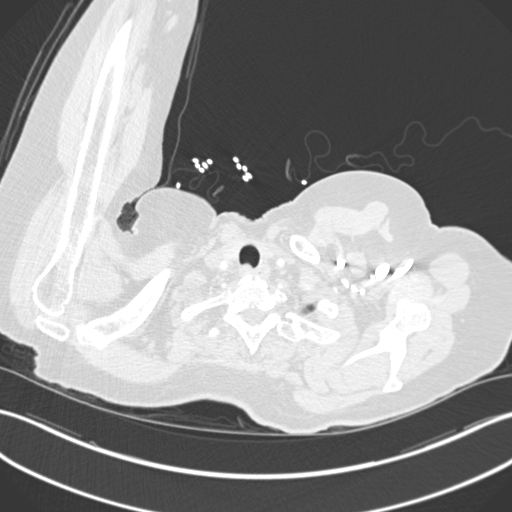
[im 307/323  mediastinal]
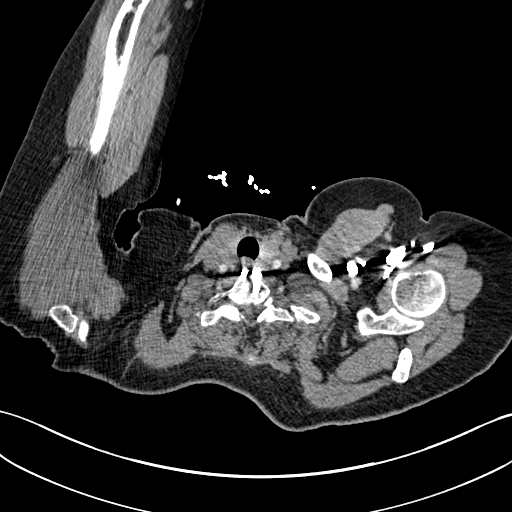

[19 of 36 positions shown; findings below may reference images not displayed]

FINDINGS: Cardiovascular: Satisfactory opacification of the pulmonary arteries
to the segmental level. No evidence of pulmonary embolism. Normal
heart size. No pericardial effusion.

Mediastinum/Nodes: No enlarged mediastinal, hilar, or axillary lymph
nodes. Thyroid gland, trachea, and esophagus demonstrate no
significant findings.

Lungs/Pleura: Lungs are clear with asymmetric dependent atelectasis
on the RIGHT, but no consolidation. No pleural effusion or
pneumothorax.

Upper Abdomen: No acute abnormality.

Musculoskeletal: No chest wall abnormality. No acute or significant
osseous findings.

Review of the MIP images confirms the above findings.
IMPRESSION: No acute findings. Specifically no evidence for acute pulmonary
emboli.

## 2017-02-21 ENCOUNTER — Other Ambulatory Visit (HOSPITAL_COMMUNITY): Payer: Self-pay | Admitting: Nephrology

## 2017-02-21 DIAGNOSIS — N183 Chronic kidney disease, stage 3 unspecified: Secondary | ICD-10-CM

## 2017-02-23 IMAGING — CR DG SACRUM/COCCYX 2+V
3 series · 3 of 3 positions shown · non-contrast
Comparison: None.

CLINICAL DATA: Fell from [place] 1 month ago with persistent low
back and sacral pain.

EXAM:
SACRUM AND COCCYX - 2+ VIEW

[t sacrum ap]
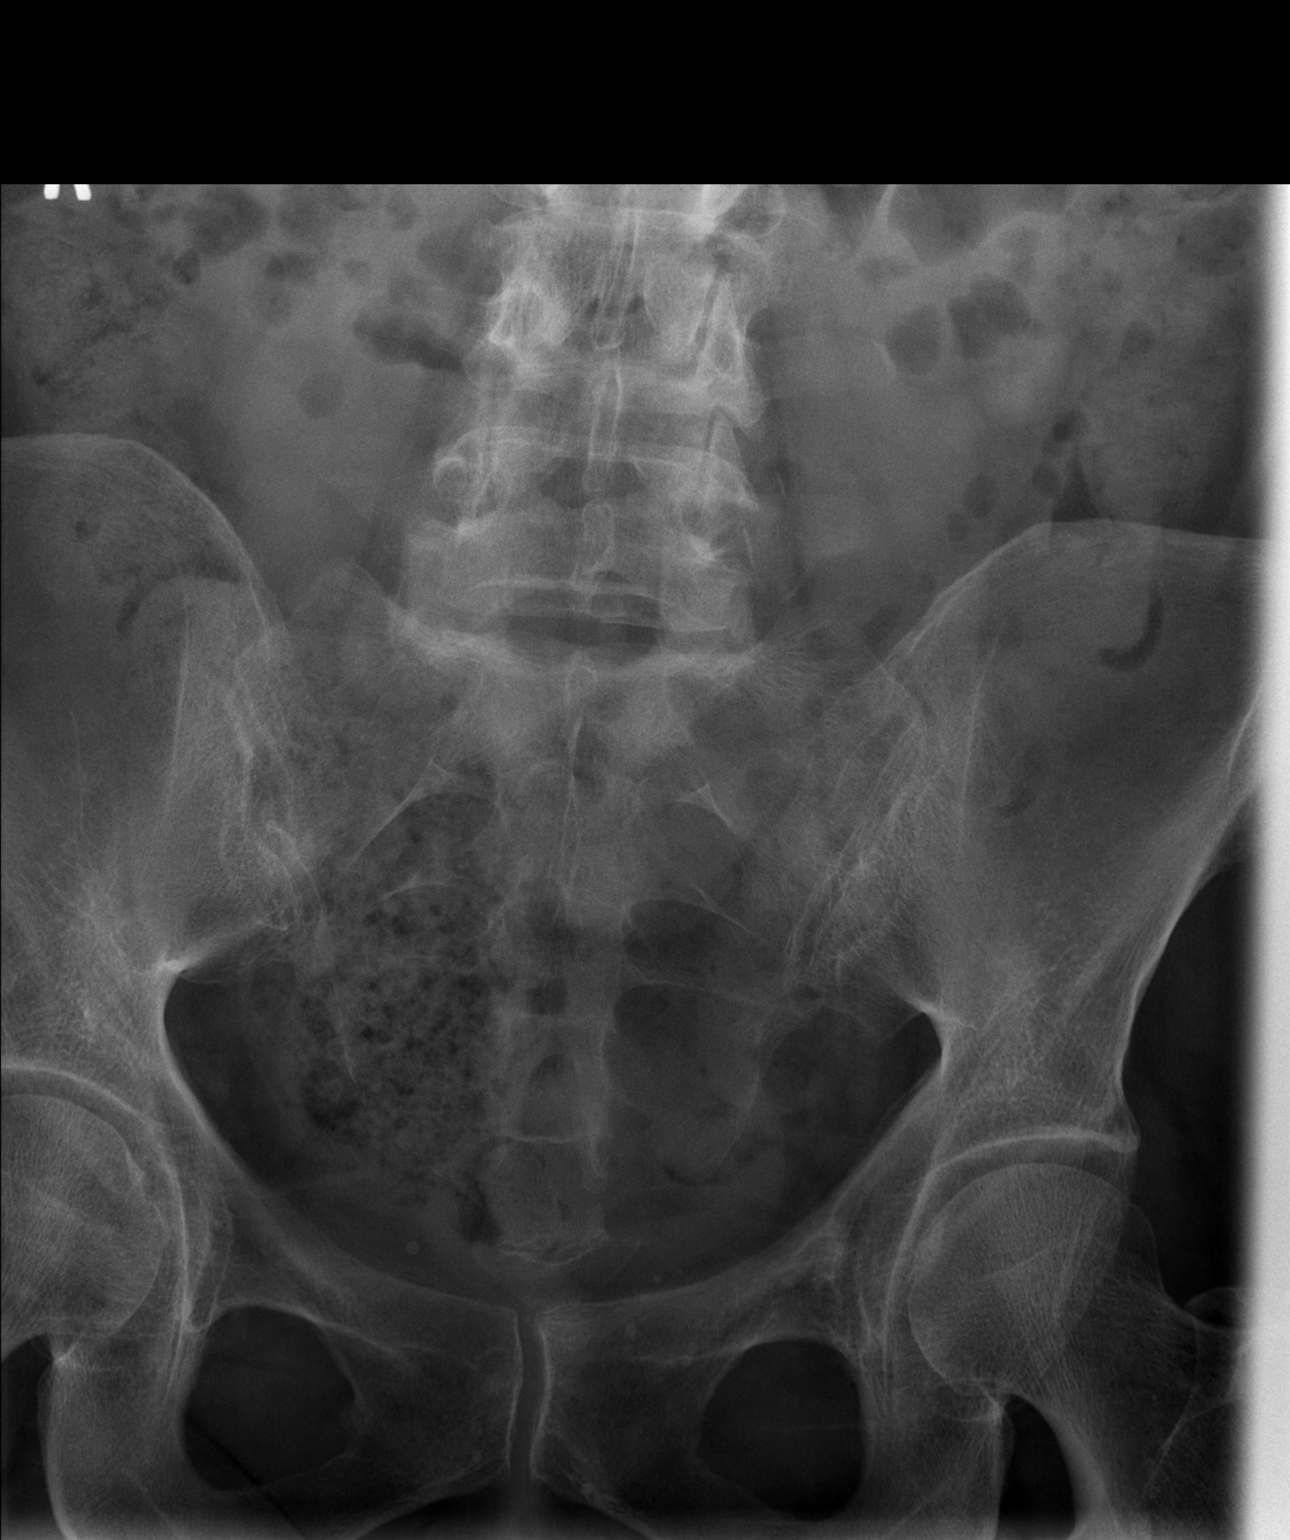

[t coccyx ap]
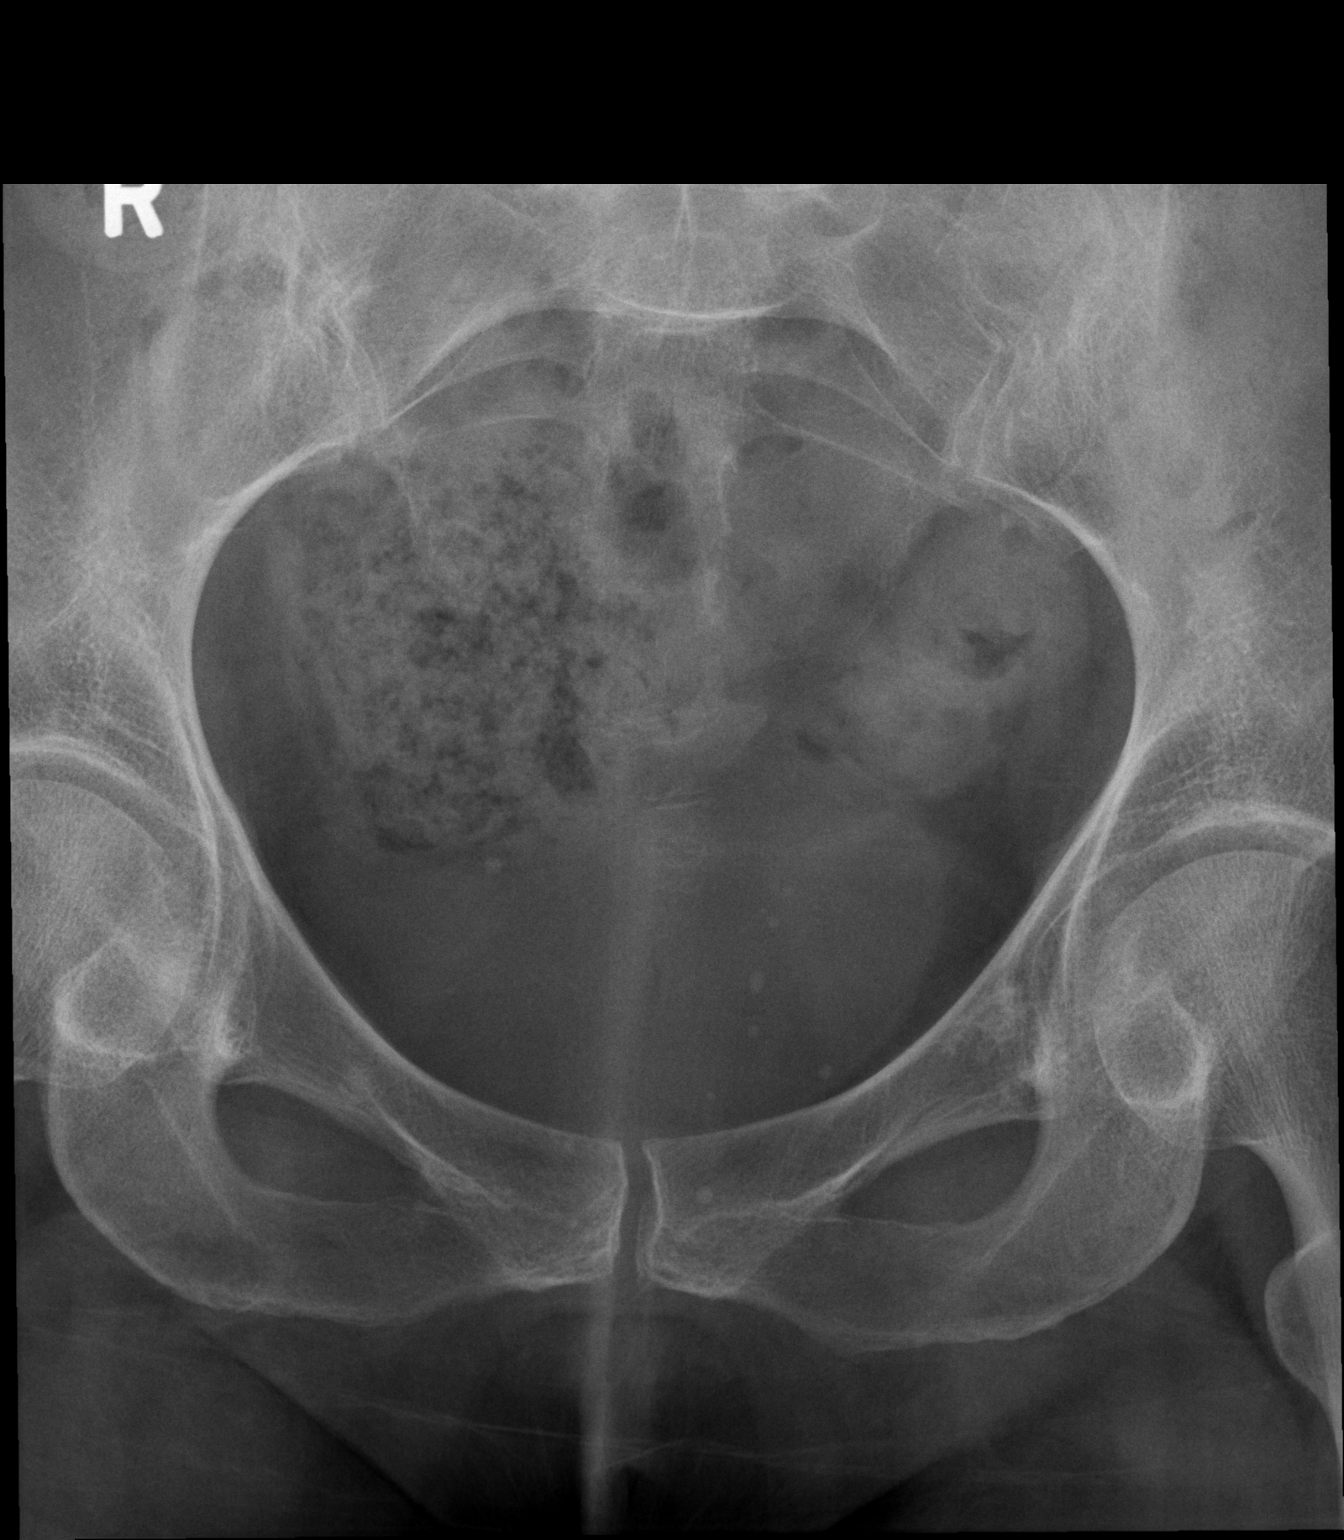

[t sacrum coccyx lat]
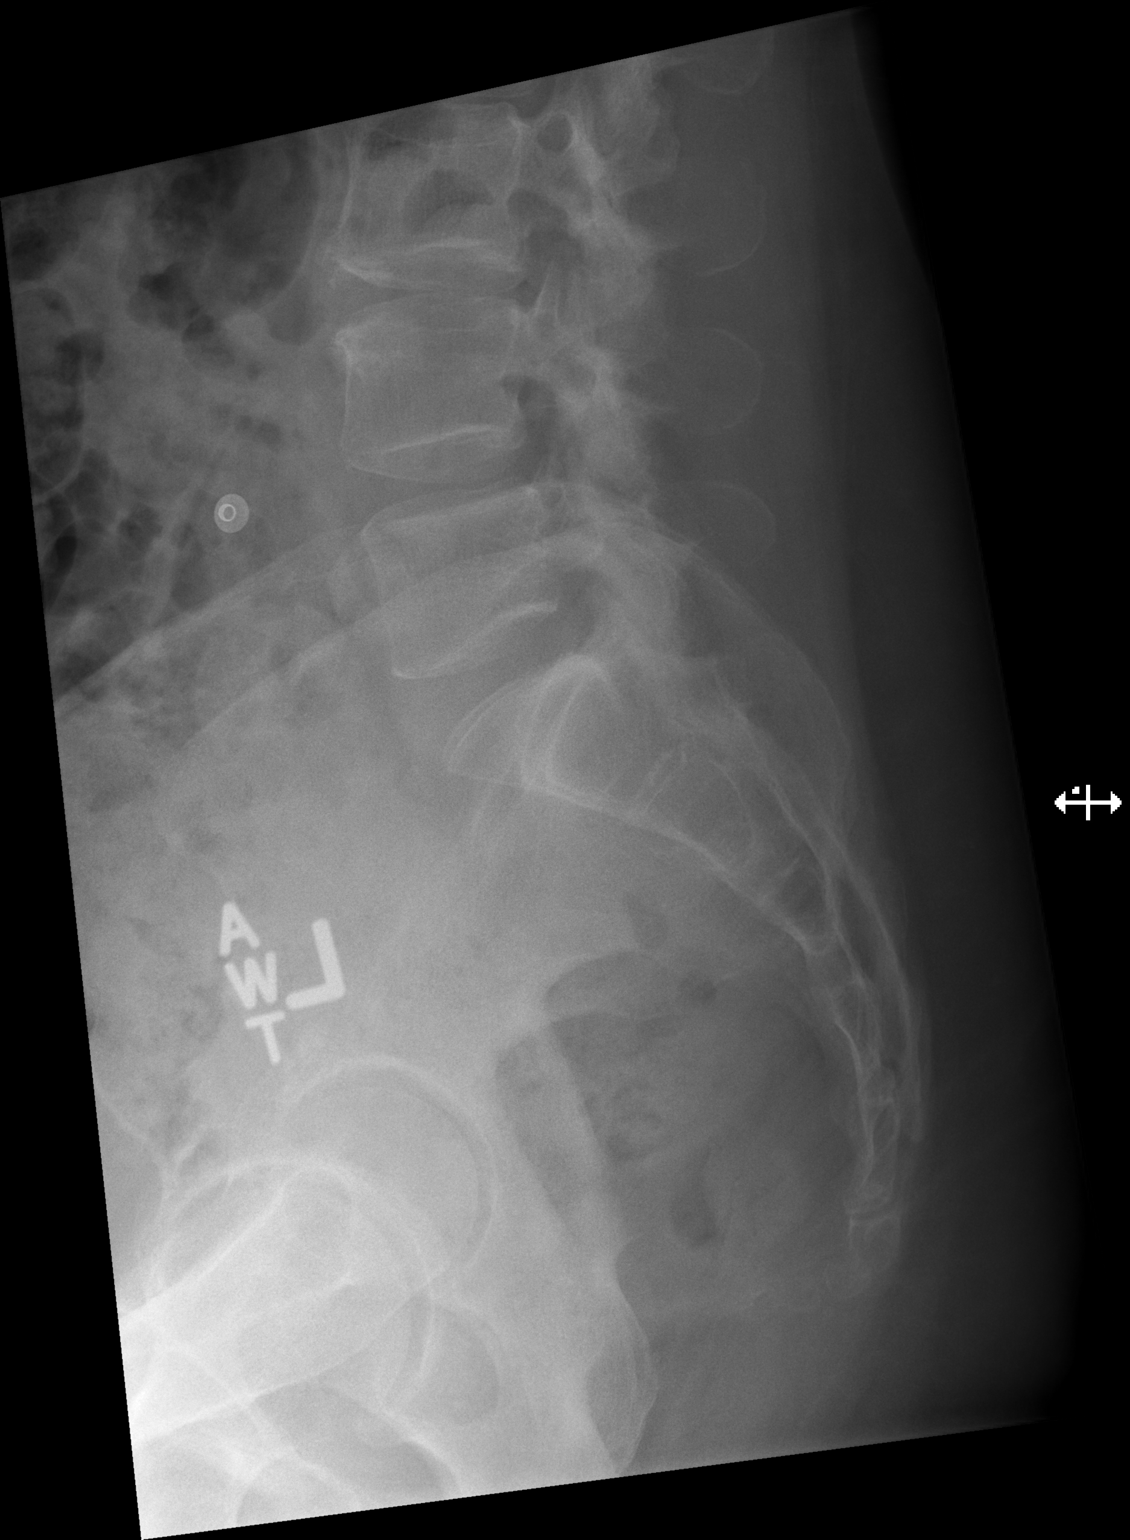

[3 of 3 positions shown; findings below may reference images not displayed]

FINDINGS: There is no evidence of fracture or other focal bone lesions.
IMPRESSION: Negative.

## 2017-02-23 IMAGING — CR DG LUMBAR SPINE COMPLETE 4+V
5 series · 5 of 5 positions shown · non-contrast
Comparison: MRI 05/11/2016.

CLINICAL DATA: Fell from [DATE] month ago with persistent low back
and sacral pain.

EXAM:
LUMBAR SPINE - COMPLETE 4+ VIEW

[t lumbar spine ap]
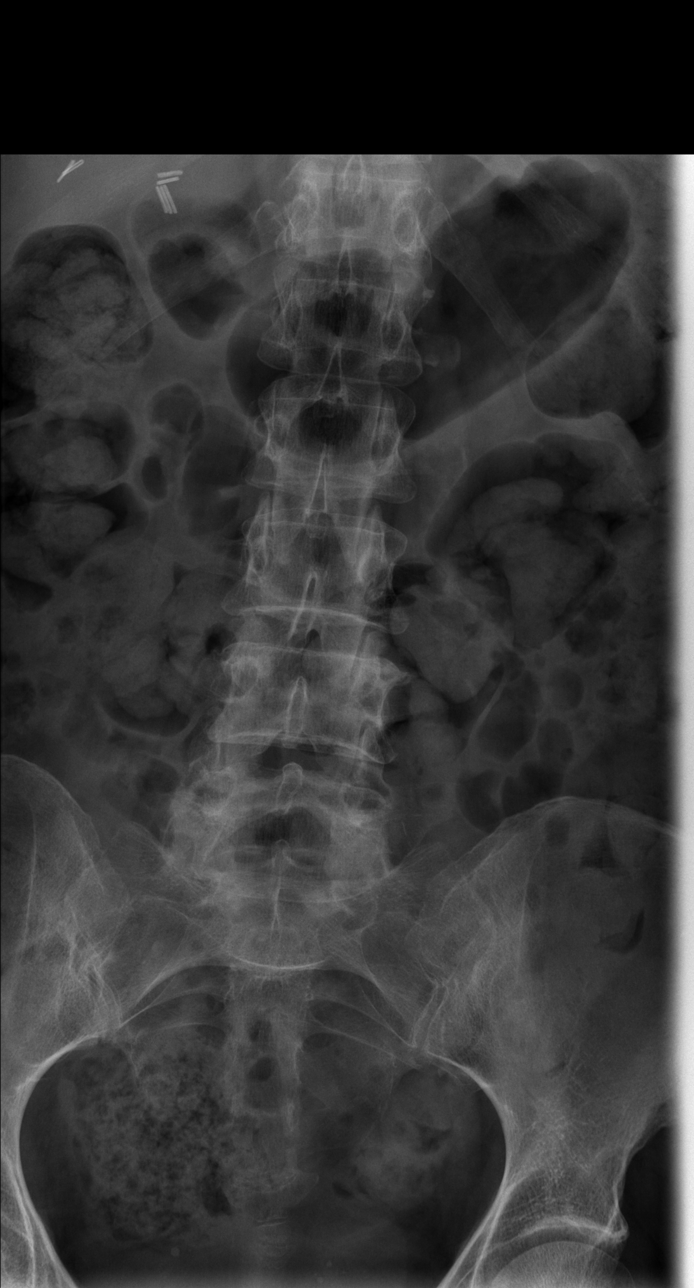

[t lumbar spine obl (1 of 2)]
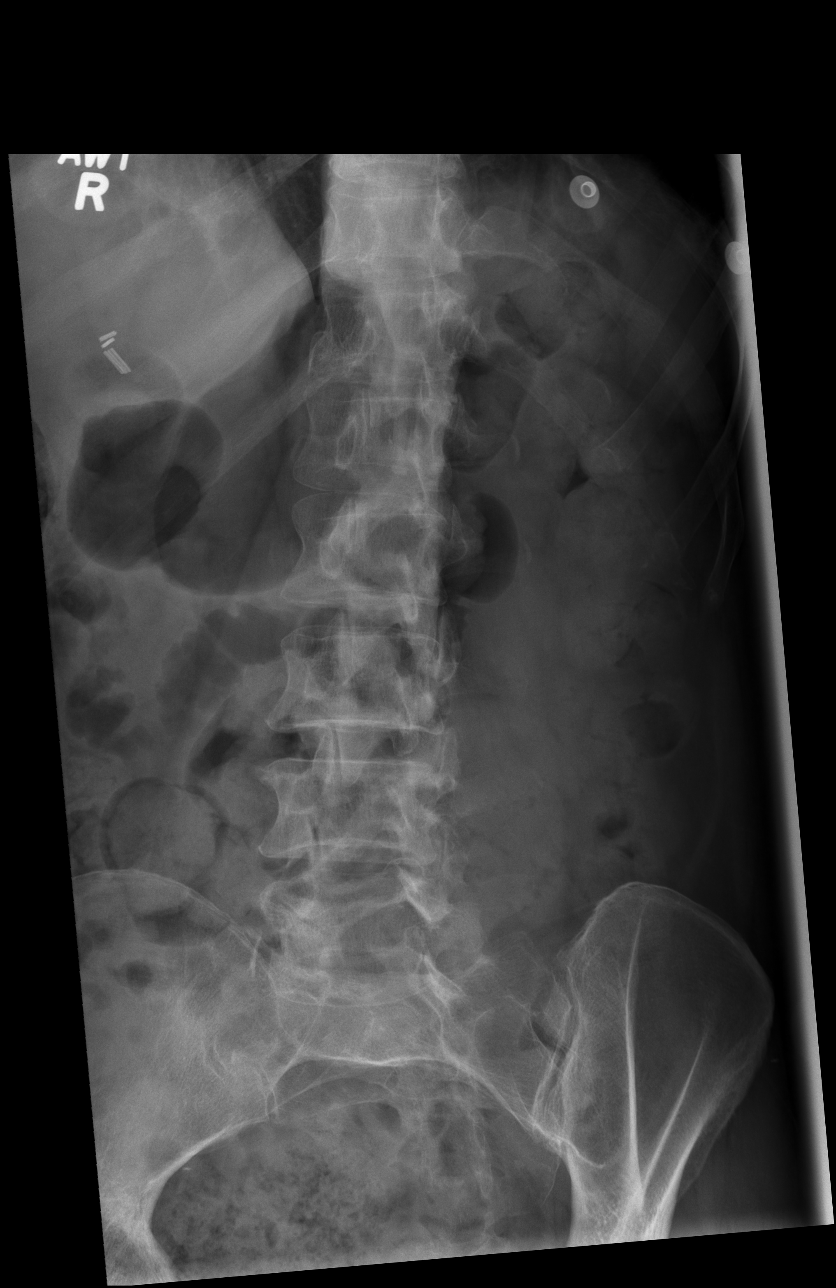

[t lumbar spine obl (2 of 2)]
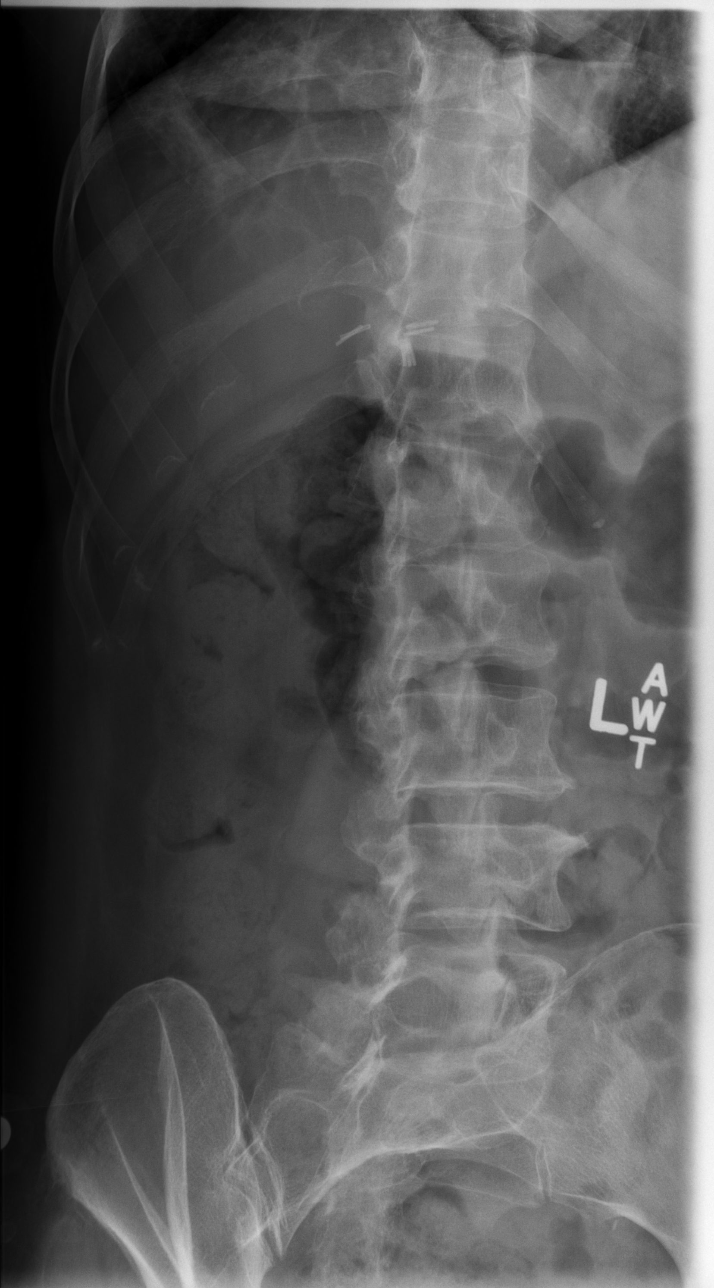

[t lumbar spine lat]
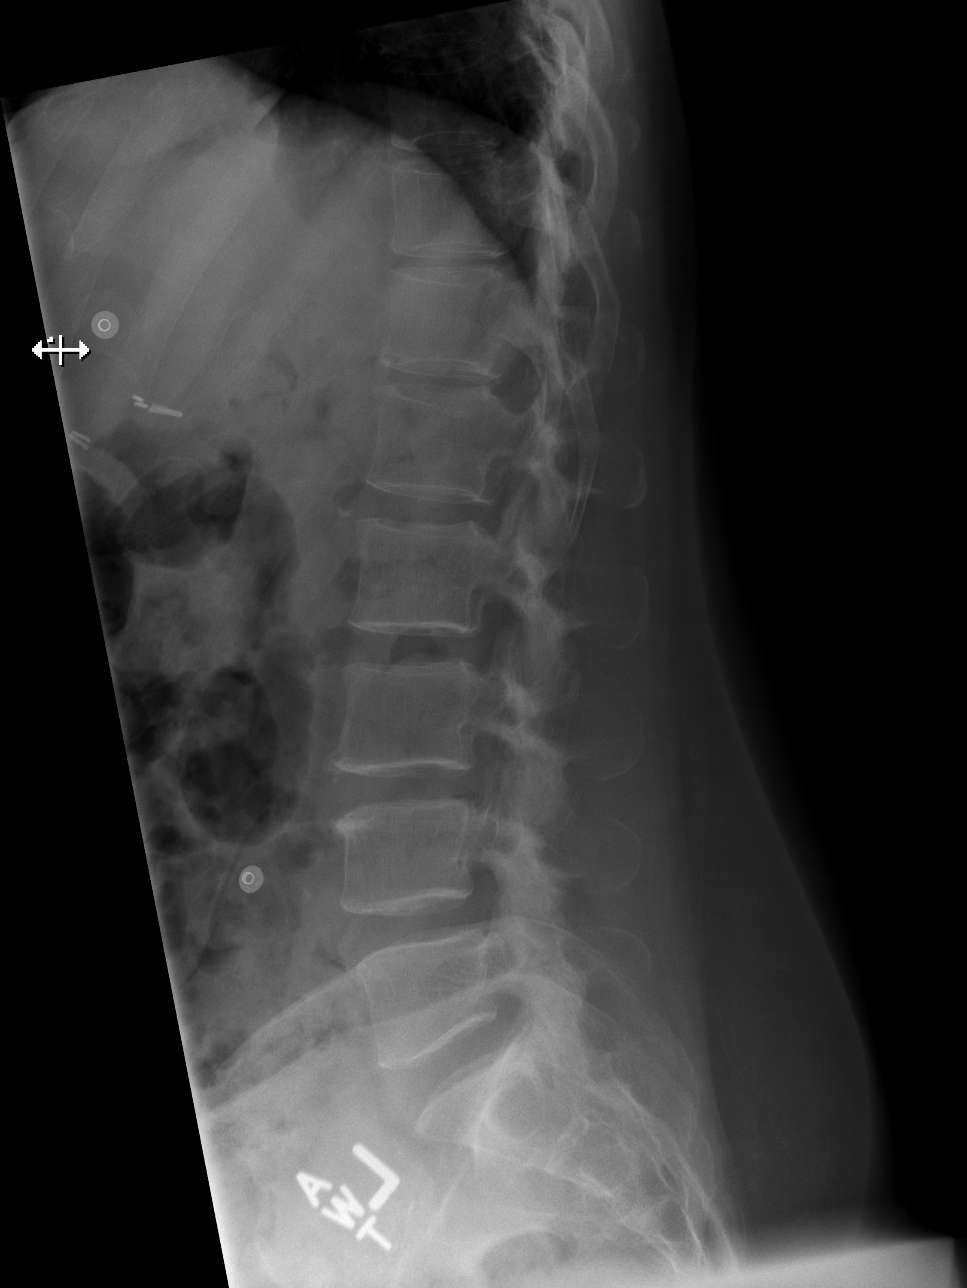

[t lumbar l-5 s-1 spot]
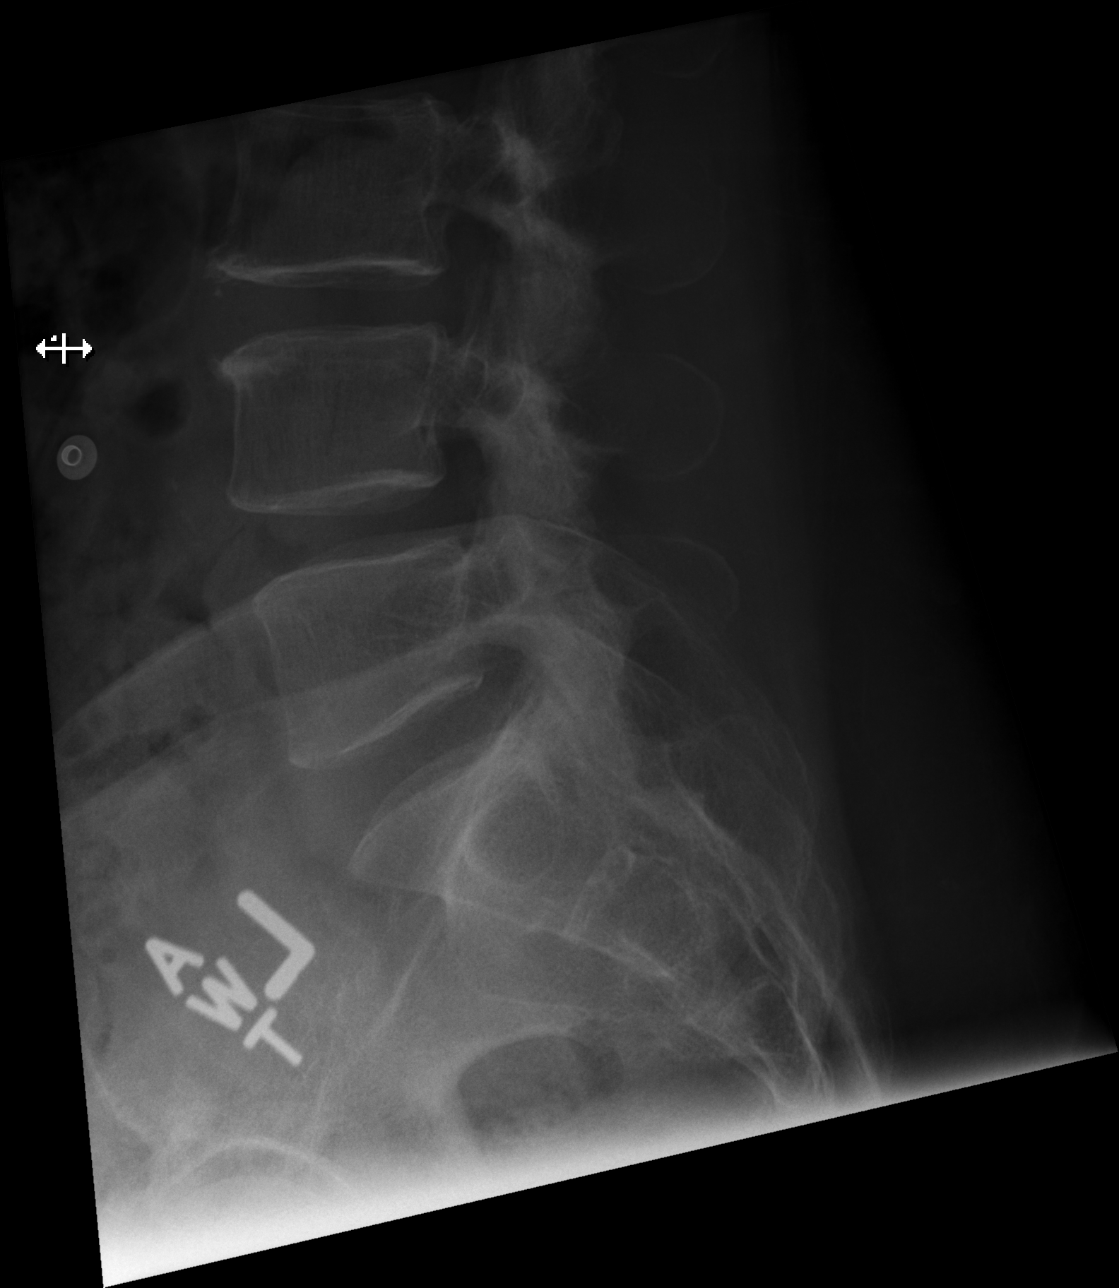

[5 of 5 positions shown; findings below may reference images not displayed]

FINDINGS: There is no evidence of lumbar spine fracture. Alignment is normal.
Intervertebral disc spaces are maintained.
IMPRESSION: Negative.

## 2017-03-03 DIAGNOSIS — I872 Venous insufficiency (chronic) (peripheral): Secondary | ICD-10-CM | POA: Diagnosis not present

## 2017-03-03 DIAGNOSIS — E273 Drug-induced adrenocortical insufficiency: Secondary | ICD-10-CM | POA: Diagnosis not present

## 2017-03-03 DIAGNOSIS — Z6830 Body mass index (BMI) 30.0-30.9, adult: Secondary | ICD-10-CM | POA: Diagnosis not present

## 2017-03-03 DIAGNOSIS — R600 Localized edema: Secondary | ICD-10-CM | POA: Diagnosis not present

## 2017-03-08 ENCOUNTER — Encounter: Payer: Self-pay | Admitting: *Deleted

## 2017-03-21 ENCOUNTER — Encounter: Payer: Self-pay | Admitting: *Deleted

## 2017-03-24 ENCOUNTER — Ambulatory Visit (INDEPENDENT_AMBULATORY_CARE_PROVIDER_SITE_OTHER): Payer: Medicare Other | Admitting: Advanced Practice Midwife

## 2017-03-24 ENCOUNTER — Encounter: Payer: Self-pay | Admitting: Advanced Practice Midwife

## 2017-03-24 ENCOUNTER — Encounter (INDEPENDENT_AMBULATORY_CARE_PROVIDER_SITE_OTHER): Payer: Self-pay

## 2017-03-24 VITALS — BP 118/74 | HR 90 | Ht 65.5 in | Wt 200.0 lb

## 2017-03-24 DIAGNOSIS — N898 Other specified noninflammatory disorders of vagina: Secondary | ICD-10-CM | POA: Diagnosis not present

## 2017-03-24 DIAGNOSIS — B9689 Other specified bacterial agents as the cause of diseases classified elsewhere: Secondary | ICD-10-CM

## 2017-03-24 DIAGNOSIS — N760 Acute vaginitis: Secondary | ICD-10-CM | POA: Diagnosis not present

## 2017-03-24 MED ORDER — CIPROFLOXACIN HCL 500 MG PO TABS: 500.0000 mg | ORAL_TABLET | Freq: Two times a day (BID) | ORAL | 0 refills | Status: DC

## 2017-03-24 MED ORDER — METRONIDAZOLE 0.75 % VA GEL: 1.0000 | Freq: Every day | VAGINAL | 0 refills | Status: DC

## 2017-03-24 NOTE — Progress Notes (Signed)
[facility] Visit  Patient name: [name] MRN [id number]  Date of birth: Apr 11, 1966  CC & HPI:  [name] is a 51 y.o. Caucasian female presenting today for AS A REFERRAL from Dr. Gerarda Fraction for pap. However, she had a hysterectomy in 2002 for ENDOMETRIOSIS. , therefore doesn't need paps.  However, she c/o vaginal spotting X 5, also saw some bright red blood when wiping.  This wIs not sexually active     Pertinent History Reviewed:  Medical & Surgical Hx:   Past Medical History:  Diagnosis Date  . Addison's disease ([place])   . Adrenal insufficiency ([place])    diagnosed 2012  . Aneurysm ([place])   . Anxiety   . Astigmatism   . CAD (coronary artery disease)    Cath 2008 EF normal. RCA 50-60, Septal 50%. Myoview 3/12: EF 53% normal perfusion  . Cardiac arrest ([place])    2/2 adissonian crisis  . Cardiomyopathy    resolved  . Chest pain    chronicc  . CHF (congestive heart failure) ([place])   . Chronic back pain   . Chronic diarrhea   . Concussion    sept 28th 2014  . Gastroparesis   . HTN (hypertension)   . Hyperlipidemia   . Hypothyroidism   . Mitral valve prolapse   . Nondiabetic gastroparesis   . QT prolongation   . Tobacco abuse    down to 2 cigarettes per day  . Vertigo    Past Surgical History:  Procedure Laterality Date  . ABDOMINAL HYSTERECTOMY    . CARDIAC CATHETERIZATION  03/2007   showed 60% lesion in the right coronary artery  . CHOLECYSTECTOMY    . LEFT HEART CATHETERIZATION WITH CORONARY ANGIOGRAM N/A 04/13/2012   Procedure: LEFT HEART CATHETERIZATION WITH CORONARY ANGIOGRAM;  Surgeon: Hillary Bow, MD;  Location: [facility] CATH LAB;  Service: Cardiovascular;  Laterality: N/A;  . SPINE SURGERY    . VARICOSE VEIN SURGERY    . VESICOVAGINAL FISTULA CLOSURE W/ TAH     Family History  Problem Relation Age of Onset  . Cancer Father        Colon  . Coronary artery disease Unknown   . Heart attack Mother     Current Outpatient Prescriptions:  .   amLODipine (NORVASC) 5 MG tablet, TAKE 1 TABLET (5 MG TOTAL) BY MOUTH DAILY. (Patient taking differently: TAKE 2 TABLET (10 MG TOTAL) BY MOUTH DAILY.), Disp: 30 tablet, Rfl: 1 .  aspirin 81 MG chewable tablet, Chew 81 mg by mouth daily. , Disp: , Rfl:  .  atorvastatin (LIPITOR) 20 MG tablet, Take 20 mg by mouth daily at 6 PM., Disp: , Rfl:  .  cholecalciferol (VITAMIN D) 1000 UNITS tablet, Take 1,000 Units by mouth daily., Disp: , Rfl:  .  cyclobenzaprine (FLEXERIL) 5 MG tablet, Take 2 tablets (10 mg total) by mouth 3 (three) times daily as needed for muscle spasms., Disp: , Rfl:  .  furosemide (LASIX) 20 MG tablet, Take 20 mg by mouth daily as needed., Disp: , Rfl:  .  hydrochlorothiazide (HYDRODIURIL) 12.5 MG tablet, Take 6.25 mg by mouth daily as needed (for High Blood Pressure >145/98). Second line medication., Disp: , Rfl:  .  hydrocortisone (CORTEF) 10 MG tablet, Take 10-20 mg by mouth 2 (two) times daily. Takes 20 mg in the morning and 10 mg at night, Disp: , Rfl:  .  levothyroxine (SYNTHROID, LEVOTHROID) 75 MCG tablet, Take 75 mcg by mouth daily  before breakfast., Disp: , Rfl:  .  LORazepam (ATIVAN) 2 MG tablet, Take 2 mg by mouth 4 (four) times daily as needed for anxiety., Disp: , Rfl:  .  meclizine (ANTIVERT) 25 MG tablet, 1 or 2 tabs PO q8h prn dizziness (Patient taking differently: Take 25-50 mg by mouth 3 (three) times daily as needed (for vertigo). ), Disp: 15 tablet, Rfl: 0 .  nitroGLYCERIN (NITROSTAT) 0.4 MG SL tablet, Place 0.4 mg under the tongue every 5 (five) minutes as needed for chest pain., Disp: , Rfl:  .  oxycodone (ROXICODONE) 30 MG immediate release tablet, Take 30 mg by mouth every 4 (four) hours as needed for pain., Disp: , Rfl:  .  pantoprazole (PROTONIX) 20 MG tablet, Take 1 tablet (20 mg total) by mouth daily. (Patient taking differently: Take 20 mg by mouth daily as needed for indigestion. ), Disp: 20 tablet, Rfl: 0 .  promethazine (PHENERGAN) 25 MG tablet, Take 25 mg  by mouth every 6 (six) hours as needed for nausea or vomiting., Disp: , Rfl:  .  vitamin C (ASCORBIC ACID) 500 MG tablet, Take 500 mg by mouth daily., Disp: , Rfl:  .  zolpidem (AMBIEN) 10 MG tablet, Take 10 mg by mouth at bedtime., Disp: , Rfl:  .  ciprofloxacin (CIPRO) 500 MG tablet, Take 1 tablet (500 mg total) by mouth 2 (two) times daily., Disp: 14 tablet, Rfl: 0 .  metroNIDAZOLE (METROGEL VAGINAL) 0.75 % vaginal gel, Place 1 Applicatorful vaginally at bedtime. For 5 nights, Disp: 70 g, Rfl: 0 Social History: Reviewed -  reports that she has been smoking Cigarettes.  She has a 2.50 pack-year smoking history. She has never used smokeless tobacco.  Review of Systems:    Constitutional: Negative for fever and chills Eyes: Negative for visual disturbances Respiratory: Negative for shortness of breath, dyspnea Cardiovascular: Negative for chest pain or palpitations  Gastrointestinal: Negative for vomiting, diarrhea and constipation; no abdominal pain Genitourinary: Negative for dysuria and urgency, vaginal irritation or itching Musculoskeletal: Negative for back pain, joint pain, myalgias  Neurological: Negative for dizziness and headaches    Objective Findings:  Vitals: BP 118/74 (BP Location: Right Arm, Patient Position: Sitting, Cuff Size: Normal)   Pulse 90   Ht 5' 5.5" (1.664 m)   Wt 200 lb (90.7 kg)   BMI 32.78 kg/m   Physical Examination: General appearance - well appearing, and in no distress Mental status - alert, oriented to person, place, and time Chest:  Normal respiratory effort Heart - normal rate and regular rhythm Abdomen:  Soft, nontender Pelvic: SSE: frothy white dc with aminie odor.  Wet prep TNTC Wbc, NO TRICH,   Few clues Musculoskeletal:  Normal range of motion without pain Extremities:  No edema  No results found for this or any previous visit (from the past 24 hour(s)).        Assessment & Plan:  A:   BV/Leukorrha P:  She had STD testing last  year although she hasn't been active in >3 years.  rx metrogel and cipro  Return for If you have any problems.   CRESENZO-DISHMAN,Jamel Dunton CNM 03/24/2017 3:12 PM

## 2017-03-29 ENCOUNTER — Encounter (HOSPITAL_COMMUNITY): Payer: Self-pay | Admitting: Emergency Medicine

## 2017-03-29 ENCOUNTER — Other Ambulatory Visit: Payer: Self-pay

## 2017-03-29 ENCOUNTER — Emergency Department (HOSPITAL_COMMUNITY): Payer: Medicare Other

## 2017-03-29 ENCOUNTER — Emergency Department (HOSPITAL_COMMUNITY)
Admission: EM | Admit: 2017-03-29 | Discharge: 2017-03-30 | Disposition: A | Discharge status: Home / Self Care | Payer: Medicare Other | Attending: Emergency Medicine | Admitting: Emergency Medicine

## 2017-03-29 DIAGNOSIS — I5089 Other heart failure: Secondary | ICD-10-CM | POA: Diagnosis not present

## 2017-03-29 DIAGNOSIS — F17210 Nicotine dependence, cigarettes, uncomplicated: Secondary | ICD-10-CM | POA: Insufficient documentation

## 2017-03-29 DIAGNOSIS — I110 Hypertensive heart disease with heart failure: Secondary | ICD-10-CM | POA: Diagnosis not present

## 2017-03-29 DIAGNOSIS — R0602 Shortness of breath: Secondary | ICD-10-CM | POA: Diagnosis not present

## 2017-03-29 DIAGNOSIS — Z7982 Long term (current) use of aspirin: Secondary | ICD-10-CM | POA: Insufficient documentation

## 2017-03-29 DIAGNOSIS — Z79899 Other long term (current) drug therapy: Secondary | ICD-10-CM | POA: Diagnosis not present

## 2017-03-29 DIAGNOSIS — R0789 Other chest pain: Secondary | ICD-10-CM | POA: Insufficient documentation

## 2017-03-29 DIAGNOSIS — I2510 Atherosclerotic heart disease of native coronary artery without angina pectoris: Secondary | ICD-10-CM | POA: Diagnosis not present

## 2017-03-29 DIAGNOSIS — R079 Chest pain, unspecified: Secondary | ICD-10-CM | POA: Diagnosis not present

## 2017-03-29 LAB — BRAIN NATRIURETIC PEPTIDE: B Natriuretic Peptide: 35 pg/mL (ref 0.0–100.0)

## 2017-03-29 LAB — BASIC METABOLIC PANEL
Anion gap: 10 (ref 5–15)
BUN: 14 mg/dL (ref 6–20)
CHLORIDE: 98 mmol/L — AB (ref 101–111)
CO2: 28 mmol/L (ref 22–32)
CREATININE: 1.25 mg/dL — AB (ref 0.44–1.00)
Calcium: 9.3 mg/dL (ref 8.9–10.3)
GFR, EST AFRICAN AMERICAN: 56 mL/min — AB (ref >60)
GFR, EST NON AFRICAN AMERICAN: 48 mL/min — AB (ref >60)
Glucose, Bld: 124 mg/dL — ABNORMAL HIGH (ref 65–99)
Potassium: 3.6 mmol/L (ref 3.5–5.1)
SODIUM: 136 mmol/L (ref 135–145)

## 2017-03-29 LAB — CBC
HCT: 42.6 % (ref 36.0–46.0)
Hemoglobin: 14.4 g/dL (ref 12.0–15.0)
MCH: 28.6 pg (ref 26.0–34.0)
MCHC: 33.8 g/dL (ref 30.0–36.0)
MCV: 84.5 fL (ref 78.0–100.0)
PLATELETS: 369 10*3/uL (ref 150–400)
RBC: 5.04 MIL/uL (ref 3.87–5.11)
RDW: 14 % (ref 11.5–15.5)
WBC: 10.5 10*3/uL (ref 4.0–10.5)

## 2017-03-29 LAB — D-DIMER, QUANTITATIVE (NOT AT ARMC): D DIMER QUANT: 1.01 ug{FEU}/mL — AB (ref 0.00–0.50)

## 2017-03-29 LAB — TROPONIN I

## 2017-03-29 MED ORDER — ASPIRIN 325 MG PO TABS
325.0000 mg | ORAL_TABLET | Freq: Once | ORAL | Status: AC
  Administered 2017-03-29: 325 mg via ORAL
  Filled 2017-03-29: qty 1

## 2017-03-29 MED ORDER — HYDROCODONE-ACETAMINOPHEN 5-325 MG PO TABS
1.0000 | ORAL_TABLET | Freq: Once | ORAL | Status: AC
  Administered 2017-03-29: 1 via ORAL
  Filled 2017-03-29: qty 1

## 2017-03-29 NOTE — ED Notes (Signed)
Pt with hx of Addison's dz

## 2017-03-29 NOTE — ED Notes (Signed)
Patient transported to X-ray 

## 2017-03-29 NOTE — ED Triage Notes (Signed)
Pt c/o chest pain that started at 2000 with nausea and dizziness.

## 2017-03-29 NOTE — ED Notes (Signed)
Pt c/o nausea.  

## 2017-03-29 NOTE — ED Notes (Signed)
Attempted x 2 for IV access without success. CN in room to assess for IV access.

## 2017-03-29 NOTE — ED Notes (Signed)
When patient arrived to room oxygen sat was 90% on RA. Patient placed in 2 LPM of oxygen via [place], sats increased to 98%. NAD noted. Patient also placed on cardiac monitor.

## 2017-03-30 ENCOUNTER — Ambulatory Visit (HOSPITAL_COMMUNITY)
Admission: RE | Admit: 2017-03-30 | Discharge: 2017-03-30 | Disposition: A | Discharge status: Home / Self Care | Payer: Medicare Other | Source: Ambulatory Visit | Attending: Nephrology | Admitting: Nephrology

## 2017-03-30 ENCOUNTER — Encounter (HOSPITAL_COMMUNITY): Payer: Self-pay

## 2017-03-30 ENCOUNTER — Emergency Department (HOSPITAL_COMMUNITY): Payer: Medicare Other

## 2017-03-30 DIAGNOSIS — R079 Chest pain, unspecified: Secondary | ICD-10-CM | POA: Diagnosis not present

## 2017-03-30 LAB — TROPONIN I: Troponin I: <0.03 ng/mL (ref <0.03)

## 2017-03-30 MED ORDER — SODIUM CHLORIDE 0.9 % IV BOLUS (SEPSIS)
500.0000 mL | Freq: Once | INTRAVENOUS | Status: AC
  Administered 2017-03-30: 500 mL via INTRAVENOUS

## 2017-03-30 MED ORDER — ONDANSETRON HCL 4 MG/2ML IJ SOLN
4.0000 mg | Freq: Once | INTRAMUSCULAR | Status: AC
  Administered 2017-03-30: 4 mg via INTRAVENOUS
  Filled 2017-03-30: qty 2

## 2017-03-30 MED ORDER — FENTANYL CITRATE (PF) 100 MCG/2ML IJ SOLN
50.0000 ug | Freq: Once | INTRAMUSCULAR | Status: AC
  Administered 2017-03-30: 50 ug via INTRAVENOUS
  Filled 2017-03-30: qty 2

## 2017-03-30 MED ORDER — IOPAMIDOL (ISOVUE-370) INJECTION 76%
100.0000 mL | Freq: Once | INTRAVENOUS | Status: AC | PRN
  Administered 2017-03-30: 100 mL via INTRAVENOUS

## 2017-03-30 NOTE — ED Notes (Signed)
Pt alert & oriented x4, stable gait. Patient given discharge instructions, paperwork & prescription(s). Patient  instructed to stop at the registration desk to finish any additional paperwork. Patient verbalized understanding. Pt left department w/ no further questions. 

## 2017-03-30 NOTE — Discharge Instructions (Signed)
You were seen today for chest pain and body aches. Your workup is reassuring. Your EKG shows no signs of ischemia. Your heart tests are negative. Your CT scan is also negative. Follow-up closely with your primary physician and cardiology. If you have any new or worsening symptoms you should be reevaluated.

## 2017-03-30 NOTE — ED Provider Notes (Signed)
[place] DEPT Provider Note   CSN: [id number] Arrival date & time: 03/29/17  2104     History   Chief Complaint Chief Complaint  Patient presents with  . Chest Pain    HPI [name] is a 51 y.o. female.  HPI  This is a 51 year old female with multiple medical problems including Addison's disease, nonischemic cardiomyopathy resolved, hypertension, hyperlipidemia, chronic chest pain, back pain who presents for chest pain. Patient reports onset of chest discomfort at 8 PM. She states that it is sharp and tingly. Not pleuritic. Nothing seems to make it better or worse. She has had similar pains in the past. She reports associated nausea and dizziness. Denies cough or fevers. Does endorse shortness of breath. No history of blood clots, leg swelling, recent hospitalizations. Currently she states that "my whole body hurts." She is not having any ongoing chest pain at this time.  I reviewed the changes chart. She had nonischemic cardiomyopathy which resolved on her last echo. She had a cardiac catheterization in 2013 which did not show any evidence of coronary artery disease at that time. She has had several episodes of atypical chest pain and has been admitted to the hospital and effectively ruled out.  Past Medical History:  Diagnosis Date  . Addison's disease ([place])   . Adrenal insufficiency ([place])    diagnosed 2012  . Aneurysm ([place])   . Anxiety   . Astigmatism   . CAD (coronary artery disease)    Cath 2008 EF normal. RCA 50-60, Septal 50%. Myoview 3/12: EF 53% normal perfusion  . Cardiac arrest ([place])    2/2 adissonian crisis  . Cardiomyopathy    resolved  . Chest pain    chronicc  . CHF (congestive heart failure) ([place])   . Chronic back pain   . Chronic diarrhea   . Concussion    sept 28th 2014  . Gastroparesis   . HTN (hypertension)   . Hyperlipidemia   . Hypothyroidism   . Mitral valve prolapse   . Nondiabetic gastroparesis   . QT prolongation   . Tobacco  abuse    down to 2 cigarettes per day  . Vertigo     Patient Active Problem List   Diagnosis Date Noted  . Myositis 05/11/2016  . Anxiety 05/26/2015  . Tremors of nervous system 05/26/2015  . Neck pain 05/26/2015  . Hot flushes, perimenopausal 07/24/2013  . CAD (coronary artery disease) 04/11/2012  . GERD (gastroesophageal reflux disease) 04/11/2012  . Addison's disease ([place]) 03/03/2012  . QT prolongation 12/15/2010  . Palpitations 01/01/2009  . Hypothyroidism 12/31/2008  . Hyperlipidemia 12/31/2008  . OVERWEIGHT/OBESITY 12/31/2008  . Essential hypertension 12/31/2008    Past Surgical History:  Procedure Laterality Date  . ABDOMINAL HYSTERECTOMY    . CARDIAC CATHETERIZATION  03/2007   showed 60% lesion in the right coronary artery  . CHOLECYSTECTOMY    . LEFT HEART CATHETERIZATION WITH CORONARY ANGIOGRAM N/A 04/13/2012   Procedure: LEFT HEART CATHETERIZATION WITH CORONARY ANGIOGRAM;  Surgeon: Hillary Bow, MD;  Location: [facility] CATH LAB;  Service: Cardiovascular;  Laterality: N/A;  . SPINE SURGERY    . VARICOSE VEIN SURGERY    . VESICOVAGINAL FISTULA CLOSURE W/ TAH      OB History    Gravida Para Term Preterm AB Living   3 2 2  0 1 2   SAB TAB Ectopic Multiple Live Births   1  Home Medications    Prior to Admission medications   Medication Sig Start Date End Date Taking? Authorizing Provider  amLODipine (NORVASC) 5 MG tablet TAKE 1 TABLET (5 MG TOTAL) BY MOUTH DAILY. Patient taking differently: TAKE 2 TABLET (10 MG TOTAL) BY MOUTH DAILY. 07/25/15  Yes Pixie Casino, MD  aspirin 81 MG chewable tablet Chew 81 mg by mouth daily.    Yes [provider]  atorvastatin (LIPITOR) 20 MG tablet Take 20 mg by mouth every morning.    Yes [provider]  cholecalciferol (VITAMIN D) 1000 UNITS tablet Take 1,000 Units by mouth daily.   Yes [provider]  cyclobenzaprine (FLEXERIL) 5 MG tablet Take 2 tablets (10 mg total) by mouth 3  (three) times daily as needed for muscle spasms. 05/15/16  Yes Hoffman, Jessica Ratliff, DO  furosemide (LASIX) 20 MG tablet Take 20 mg by mouth daily as needed for fluid.    Yes [provider]  hydrochlorothiazide (HYDRODIURIL) 12.5 MG tablet Take 6.25 mg by mouth daily as needed (for High Blood Pressure >145/98). Second line medication.   Yes [provider]  hydrocortisone (CORTEF) 10 MG tablet Take 10-20 mg by mouth 2 (two) times daily. Takes 20 mg in the morning and 10 mg at night 04/07/16  Yes [provider]  levothyroxine (SYNTHROID, LEVOTHROID) 75 MCG tablet Take 75 mcg by mouth daily before breakfast.   Yes [provider]  LORazepam (ATIVAN) 2 MG tablet Take 2 mg by mouth 4 (four) times daily as needed for anxiety. 05/06/16  Yes [provider]  meclizine (ANTIVERT) 25 MG tablet 1 or 2 tabs PO q8h prn dizziness Patient taking differently: Take 25-50 mg by mouth 3 (three) times daily as needed (for vertigo).  04/16/16  Yes Francine Graven, DO  nitroGLYCERIN (NITROSTAT) 0.4 MG SL tablet Place 0.4 mg under the tongue every 5 (five) minutes as needed for chest pain.   Yes [provider]  oxycodone (ROXICODONE) 30 MG immediate release tablet Take 30 mg by mouth every 4 (four) hours as needed for pain.   Yes [provider]  pantoprazole (PROTONIX) 20 MG tablet Take 1 tablet (20 mg total) by mouth daily. Patient taking differently: Take 20 mg by mouth daily as needed for indigestion.  02/04/16  Yes Ward, Ozella Almond, PA-C  promethazine (PHENERGAN) 25 MG tablet Take 25 mg by mouth every 6 (six) hours as needed for nausea or vomiting.   Yes [provider]  vitamin C (ASCORBIC ACID) 500 MG tablet Take 500 mg by mouth daily.   Yes [provider]  zolpidem (AMBIEN) 10 MG tablet Take 10 mg by mouth at bedtime. 05/06/16  Yes [provider]  ciprofloxacin (CIPRO) 500 MG tablet Take 1 tablet (500 mg total) by mouth  2 (two) times daily. Patient not taking: Reported on 03/29/2017 03/24/17   Cresenzo-Dishmon, Joaquim Lai, CNM  metroNIDAZOLE (METROGEL VAGINAL) 0.75 % vaginal gel Place 1 Applicatorful vaginally at bedtime. For 5 nights Patient not taking: Reported on 03/29/2017 03/24/17   Christin Fudge, CNM    Family History Family History  Problem Relation Age of Onset  . Cancer Father        Colon  . Coronary artery disease Unknown   . Heart attack Mother     Social History Social History  Substance Use Topics  . Smoking status: Current Every Day Smoker    Packs/day: 0.25    Years: 10.00    Types: Cigarettes  .  Smokeless tobacco: Never Used     Comment: smokes 2 a day  . Alcohol use No     Allergies   Bee venom; Doxycycline; Erythromycin; Ibuprofen; Morphine and related; Penicillins; Sulfa antibiotics; Cephalexin; Dust mite extract; Tramadol; Other; and Sulfonamide derivatives   Review of Systems Review of Systems  Constitutional: Negative for fever.  Respiratory: Positive for shortness of breath.   Cardiovascular: Positive for chest pain. Negative for leg swelling.  Gastrointestinal: Positive for nausea. Negative for vomiting.  Musculoskeletal: Positive for myalgias.  Neurological: Positive for dizziness and headaches.  All other systems reviewed and are negative.    Physical Exam Updated Vital Signs BP (!) 103/52 (BP Location: Left Wrist)   Pulse 77   Temp 98.8 F (37.1 C) (Oral)   Resp 15   Ht 5\' 5"  (1.651 m)   Wt 89.8 kg (198 lb)   SpO2 95%   BMI 32.95 kg/m   Physical Exam  Constitutional: She is oriented to person, place, and time. She appears well-developed and well-nourished. No distress.  HENT:  Head: Normocephalic and atraumatic.  Cardiovascular: Normal rate, regular rhythm and normal heart sounds.   Pulmonary/Chest: Effort normal and breath sounds normal. No respiratory distress. She has no wheezes.  Abdominal: Soft. Bowel sounds are normal. There is no  tenderness.  Musculoskeletal: She exhibits no edema.  Neurological: She is alert and oriented to person, place, and time.  Skin: Skin is warm and dry.  Psychiatric: She has a normal mood and affect.  Nursing note and vitals reviewed.    ED Treatments / Results  Labs (all labs ordered are listed, but only abnormal results are displayed) Labs Reviewed  BASIC METABOLIC PANEL - Abnormal; Notable for the following:       Result Value   Chloride 98 (*)    Glucose, Bld 124 (*)    Creatinine, Ser 1.25 (*)    GFR calc non Af Amer 48 (*)    GFR calc Af Amer 56 (*)    All other components within normal limits  D-DIMER, QUANTITATIVE (NOT AT [facility]) - Abnormal; Notable for the following:    D-Dimer, Quant 1.01 (*)    All other components within normal limits  CBC  TROPONIN I  TROPONIN I  BRAIN NATRIURETIC PEPTIDE    EKG  EKG Interpretation None       Radiology Dg Chest 2 View  Result Date: 03/29/2017 CLINICAL DATA:  Chest pain and shortness of breath for 1 day EXAM: CHEST  2 VIEW COMPARISON:  09/05/2016 FINDINGS: Cardiac shadow is within normal limits. The lungs are well aerated bilaterally. No focal infiltrate is seen. No acute bony abnormality is noted. Postsurgical changes in the cervical spine are seen. IMPRESSION: No active cardiopulmonary disease. Electronically Signed   By: Inez Catalina M.D.   On: 03/29/2017 22:35   Ct Angio Chest Pe W And/or Wo Contrast  Result Date: 03/30/2017 CLINICAL DATA:  Chest pain and nausea, onset at 20:00 EXAM: CT ANGIOGRAPHY CHEST WITH CONTRAST TECHNIQUE: Multidetector CT imaging of the chest was performed using the standard protocol during bolus administration of intravenous contrast. Multiplanar CT image reconstructions and MIPs were obtained to evaluate the vascular anatomy. CONTRAST:  100 mL Isovue 370 intravenous COMPARISON:  05/25/2016 FINDINGS: Cardiovascular: Satisfactory opacification of the pulmonary arteries to the segmental level. No  evidence of pulmonary embolism. Normal heart size. No pericardial effusion. Mediastinum/Nodes: No enlarged mediastinal, hilar, or axillary lymph nodes. Thyroid gland, trachea, and esophagus demonstrate no significant findings. Lungs/Pleura:  Linear atelectatic appearing opacities in the posterior bases bilaterally. No confluent airspace consolidation. No pleural effusions. Airways are patent and normal in caliber. Upper Abdomen: No acute findings Musculoskeletal: No significant skeletal lesions. Review of the MIP images confirms the above findings. IMPRESSION: Negative for pulmonary embolism. Atelectatic appearing posterior lung base opacities bilaterally. Electronically Signed   By: Andreas Newport M.D.   On: 03/30/2017 03:39    Procedures Procedures (including critical care time)  Angiocath insertion Performed by: Merryl Hacker  Consent: Verbal consent obtained. Risks and benefits: risks, benefits and alternatives were discussed Time out: Immediately prior to procedure a "time out" was called to verify the correct patient, procedure, equipment, support staff and site/side marked as required.  Preparation: Patient was prepped and draped in the usual sterile fashion.  Vein Location: right basillic  Ultrasound Guided  Gauge: 20  Normal blood return and flush without difficulty Patient tolerance: Patient tolerated the procedure well with no immediate complications.     Medications Ordered in ED Medications  HYDROcodone-acetaminophen (NORCO/VICODIN) 5-325 MG per tablet 1 tablet (1 tablet Oral Given 03/29/17 2339)  aspirin tablet 325 mg (325 mg Oral Given 03/29/17 2339)  ondansetron (ZOFRAN) injection 4 mg (4 mg Intravenous Given 03/30/17 0027)  sodium chloride 0.9 % bolus 500 mL (0 mLs Intravenous Stopped 03/30/17 0234)  fentaNYL (SUBLIMAZE) injection 50 mcg (50 mcg Intravenous Given 03/30/17 0212)  iopamidol (ISOVUE-370) 76 % injection 100 mL (100 mLs Intravenous Contrast Given  03/30/17 0222)     Initial Impression / Assessment and Plan / ED Course  I have reviewed the triage vital signs and the nursing notes.  Pertinent labs & imaging results that were available during my care of the patient were reviewed by me and considered in my medical decision making (see chart for details).     Patient presents with chest discomfort. Currently she is mostly complaining of whole body pain. Chest pain has resolved. She is nontoxic-appearing. She was initially placed on oxygen for borderline O2 sats. She is an ongoing smoker. She is in no respiratory distress. No wheezing. Vital signs are otherwise reassuring. EKG shows no evidence of acute ischemia. Troponin negative. Basic labwork reassuring. Chest x-ray without evidence of effusion or pneumonia. Will obtain screening d-dimer given borderline O2 sats. D-dimer is positive. She will do CT scan. Will repeat troponin. Given reassuring cardiac catheterization within the last 5 years, if repeat troponin is negative, feel this will be reassuring. Troponin is negative. CT scan is also negative. Patient feels improved in the ED. Close follow-up recommended with cardiology.Marland Kitchen Atypical for ACS.  After history, exam, and medical workup I feel the patient has been appropriately medically screened and is safe for discharge home. Pertinent diagnoses were discussed with the patient. Patient was given return precautions.   Final Clinical Impressions(s) / ED Diagnoses   Final diagnoses:  Atypical chest pain    New Prescriptions New Prescriptions   No medications on file     Merryl Hacker, MD 03/30/17 [phone]

## 2017-04-03 NOTE — Progress Notes (Deleted)
Cardiology Office Note    Date:  04/03/2017   ID:  [name], DOB June 03, 1966, MRN [id number]  PCP:  Redmond School, MD  Cardiologist: Dr. Debara Pickett   No chief complaint on file.   History of Present Illness:    [name] is a 51 y.o. female with past medical history of CAD (cath in 2013 showing nonobstructive CAD), HTN, HLD, Addison's disease, hypothyroidism, and tobacco use who presents to the office today for follow-up.   She was last examined by Dr. Debara Pickett in 06/2015 and denied any recent chest pain or dyspnea on exertion at that time. She reported daytime somnolence and PND, therefore an outpatient sleep study was recommended (does not appear this was obtained).     The patient was evaluated at [facility] ED on 03/29/2017 and reported a sharp pain which had started earlier in the evening. Initial and delta troponin were negative with EKG showing no acute ischemic changes. D-dimer was elevated at 1.01, therefore a CTA was obtained and negative for PE. She was discharged from the ED with close Cardiology follow-up recommended.     Past Medical History:  Diagnosis Date  . Addison's disease ([place])   . Adrenal insufficiency ([place])    diagnosed 2012  . Aneurysm ([place])   . Anxiety   . Astigmatism   . CAD (coronary artery disease)    Cath 2008 EF normal. RCA 50-60, Septal 50%. Myoview 3/12: EF 53% normal perfusion  . Cardiac arrest ([place])    2/2 adissonian crisis  . Cardiomyopathy    resolved  . Chest pain    chronicc  . CHF (congestive heart failure) ([place])   . Chronic back pain   . Chronic diarrhea   . Concussion    sept 28th 2014  . Gastroparesis   . HTN (hypertension)   . Hyperlipidemia   . Hypothyroidism   . Mitral valve prolapse   . Nondiabetic gastroparesis   . QT prolongation   . Tobacco abuse    down to 2 cigarettes per day  . Vertigo     Past Surgical History:  Procedure Laterality Date  . ABDOMINAL HYSTERECTOMY    . CARDIAC CATHETERIZATION  03/2007   showed 60% lesion in the right coronary artery  . CHOLECYSTECTOMY    . LEFT HEART CATHETERIZATION WITH CORONARY ANGIOGRAM N/A 04/13/2012   Procedure: LEFT HEART CATHETERIZATION WITH CORONARY ANGIOGRAM;  Surgeon: Hillary Bow, MD;  Location[facility] CATH LAB;  Service: Cardiovascular;  Laterality: N/A;  . SPINE SURGERY    . VARICOSE VEIN SURGERY    . VESICOVAGINAL FISTULA CLOSURE W/ TAH      Current Medications: Outpatient Medications Prior to Visit  Medication Sig Dispense Refill  . amLODipine (NORVASC) 5 MG tablet TAKE 1 TABLET (5 MG TOTAL) BY MOUTH DAILY. (Patient taking differently: TAKE 2 TABLET (10 MG TOTAL) BY MOUTH DAILY.) 30 tablet 1  . aspirin 81 MG chewable tablet Chew 81 mg by mouth daily.     Marland Kitchen atorvastatin (LIPITOR) 20 MG tablet Take 20 mg by mouth every morning.     . cholecalciferol (VITAMIN D) 1000 UNITS tablet Take 1,000 Units by mouth daily.    . ciprofloxacin (CIPRO) 500 MG tablet Take 1 tablet (500 mg total) by mouth 2 (two) times daily. (Patient not taking: Reported on 03/29/2017) 14 tablet 0  . cyclobenzaprine (FLEXERIL) 5 MG tablet Take 2 tablets (10 mg total) by mouth 3 (three) times daily as needed for muscle spasms.    Marland Kitchen  furosemide (LASIX) 20 MG tablet Take 20 mg by mouth daily as needed for fluid.     . hydrochlorothiazide (HYDRODIURIL) 12.5 MG tablet Take 6.25 mg by mouth daily as needed (for High Blood Pressure >145/98). Second line medication.    . hydrocortisone (CORTEF) 10 MG tablet Take 10-20 mg by mouth 2 (two) times daily. Takes 20 mg in the morning and 10 mg at night    . levothyroxine (SYNTHROID, LEVOTHROID) 75 MCG tablet Take 75 mcg by mouth daily before breakfast.    . LORazepam (ATIVAN) 2 MG tablet Take 2 mg by mouth 4 (four) times daily as needed for anxiety.    . meclizine (ANTIVERT) 25 MG tablet 1 or 2 tabs PO q8h prn dizziness (Patient taking differently: Take 25-50 mg by mouth 3 (three) times daily as needed (for vertigo). ) 15 tablet 0  .  metroNIDAZOLE (METROGEL VAGINAL) 0.75 % vaginal gel Place 1 Applicatorful vaginally at bedtime. For 5 nights (Patient not taking: Reported on 03/29/2017) 70 g 0  . nitroGLYCERIN (NITROSTAT) 0.4 MG SL tablet Place 0.4 mg under the tongue every 5 (five) minutes as needed for chest pain.    Marland Kitchen oxycodone (ROXICODONE) 30 MG immediate release tablet Take 30 mg by mouth every 4 (four) hours as needed for pain.    . pantoprazole (PROTONIX) 20 MG tablet Take 1 tablet (20 mg total) by mouth daily. (Patient taking differently: Take 20 mg by mouth daily as needed for indigestion. ) 20 tablet 0  . promethazine (PHENERGAN) 25 MG tablet Take 25 mg by mouth every 6 (six) hours as needed for nausea or vomiting.    . vitamin C (ASCORBIC ACID) 500 MG tablet Take 500 mg by mouth daily.    Marland Kitchen zolpidem (AMBIEN) 10 MG tablet Take 10 mg by mouth at bedtime.     No facility-administered medications prior to visit.      Allergies:   Bee venom; Doxycycline; Erythromycin; Ibuprofen; Morphine and related; Penicillins; Sulfa antibiotics; Cephalexin; Dust mite extract; Tramadol; Other; and Sulfonamide derivatives   Social History   Social History  . Marital status: Divorced    Spouse name: N/A  . Number of children: N/A  . Years of education: N/A   Social History Main Topics  . Smoking status: Current Every Day Smoker    Packs/day: 0.25    Years: 10.00    Types: Cigarettes  . Smokeless tobacco: Never Used     Comment: smokes 2 a day  . Alcohol use No  . Drug use: No     Comment: Hisrory of Cocaine use   . Sexual activity: No   Other Topics Concern  . Not on file   Social History Narrative   Lives in [place] with daughter and son. She is on disability.      Family History:  The patient's ***family history includes Cancer in her father; Coronary artery disease in her unknown relative; Heart attack in her mother.   Review of Systems:   Please see the history of present illness.     General:  No chills,  fever, night sweats or weight changes.  Cardiovascular:  No chest pain, dyspnea on exertion, edema, orthopnea, palpitations, paroxysmal nocturnal dyspnea. Dermatological: No rash, lesions/masses Respiratory: No cough, dyspnea Urologic: No hematuria, dysuria Abdominal:   No nausea, vomiting, diarrhea, bright red blood per rectum, melena, or hematemesis Neurologic:  No visual changes, wkns, changes in mental status. All other systems reviewed and are otherwise negative except as noted above.  Physical Exam:    VS:  There were no vitals taken for this visit.   General: Well developed, well nourished,female appearing in no acute distress. Head: Normocephalic, atraumatic, sclera non-icteric, no xanthomas, nares are without discharge.  Neck: No carotid bruits. JVD not elevated.  Lungs: Respirations regular and unlabored, without wheezes or rales.  Heart: ***Regular rate and rhythm. No S3 or S4.  No murmur, no rubs, or gallops appreciated. Abdomen: Soft, non-tender, non-distended with normoactive bowel sounds. No hepatomegaly. No rebound/guarding. No obvious abdominal masses. Msk:  Strength and tone appear normal for age. No joint deformities or effusions. Extremities: No clubbing or cyanosis. No edema.  Distal pedal pulses are 2+ bilaterally. Neuro: Alert and oriented X 3. Moves all extremities spontaneously. No focal deficits noted. Psych:  Responds to questions appropriately with a normal affect. Skin: No rashes or lesions noted  Wt Readings from Last 3 Encounters:  03/29/17 198 lb (89.8 kg)  03/24/17 200 lb (90.7 kg)  01/03/17 198 lb (89.8 kg)        Studies/Labs Reviewed:   EKG:  EKG is*** ordered today.  The ekg ordered today demonstrates ***  Recent Labs: 05/11/2016: TSH 0.352 05/14/2016: Magnesium 1.5 09/05/2016: ALT 15 03/29/2017: B Natriuretic Peptide 35.0; BUN 14; Creatinine, Ser 1.25; Hemoglobin 14.4; Platelets 369; Potassium 3.6; Sodium 136   Lipid Panel    Component  Value Date/Time   CHOL 183 01/01/2009 1303   TRIG 112.0 01/01/2009 1303   HDL 31.20 (L) 01/01/2009 1303   CHOLHDL 6 01/01/2009 1303   VLDL 22.4 01/01/2009 1303   LDLCALC 129 (H) 01/01/2009 1303    Additional studies/ records that were reviewed today include:   Echocardiogram: 05/2015 Study Conclusions  - Left ventricle: The cavity size was normal. Wall thickness was   normal. Systolic function was normal. The estimated ejection   fraction was in the range of 55% to 60%. Wall motion was normal;   there were no regional wall motion abnormalities. Doppler   parameters are consistent with abnormal left ventricular   relaxation (grade 1 diastolic dysfunction). - Atrial septum: There was redundancy of the septum, with   borderline criteria for aneurysm.  Impressions:  - Normal LV systolic function; grade 1 diastolic dysfunction;   trace MR.  Assessment:    No diagnosis found.   Plan:   In order of problems listed above:  1. ***    Medication Adjustments/Labs and Tests Ordered: Current medicines are reviewed at length with the patient today.  Concerns regarding medicines are outlined above.  Medication changes, Labs and Tests ordered today are listed in the Patient Instructions below. There are no Patient Instructions on file for this visit.   Signed, Erma Heritage, PA-C  04/03/2017 7:02 PM    [facility] [place], [place], [place]  [address] Phone: [phone]; Fax: [phone]  [address], [place],  [address] Phone: [phone]

## 2017-04-05 ENCOUNTER — Ambulatory Visit: Payer: Medicare Other | Admitting: Student

## 2017-04-07 ENCOUNTER — Ambulatory Visit (HOSPITAL_COMMUNITY)
Admission: RE | Admit: 2017-04-07 | Discharge: 2017-04-07 | Disposition: A | Discharge status: Home / Self Care | Payer: Medicare Other | Source: Ambulatory Visit | Attending: Nephrology | Admitting: Nephrology

## 2017-04-07 DIAGNOSIS — I10 Essential (primary) hypertension: Secondary | ICD-10-CM | POA: Diagnosis not present

## 2017-04-07 DIAGNOSIS — D509 Iron deficiency anemia, unspecified: Secondary | ICD-10-CM | POA: Diagnosis not present

## 2017-04-07 DIAGNOSIS — Z79899 Other long term (current) drug therapy: Secondary | ICD-10-CM | POA: Diagnosis not present

## 2017-04-07 DIAGNOSIS — N183 Chronic kidney disease, stage 3 unspecified: Secondary | ICD-10-CM

## 2017-04-07 DIAGNOSIS — R809 Proteinuria, unspecified: Secondary | ICD-10-CM | POA: Diagnosis not present

## 2017-04-07 DIAGNOSIS — Z1159 Encounter for screening for other viral diseases: Secondary | ICD-10-CM | POA: Diagnosis not present

## 2017-04-07 DIAGNOSIS — E559 Vitamin D deficiency, unspecified: Secondary | ICD-10-CM | POA: Diagnosis not present

## 2017-04-07 IMAGING — DX DG HAND COMPLETE 3+V*R*
3 series · 3 of 3 positions shown · non-contrast
Comparison: June 17, 2011

CLINICAL DATA: Pain following fall

EXAM:
RIGHT HAND - COMPLETE 3+ VIEW

[x hand pa right]
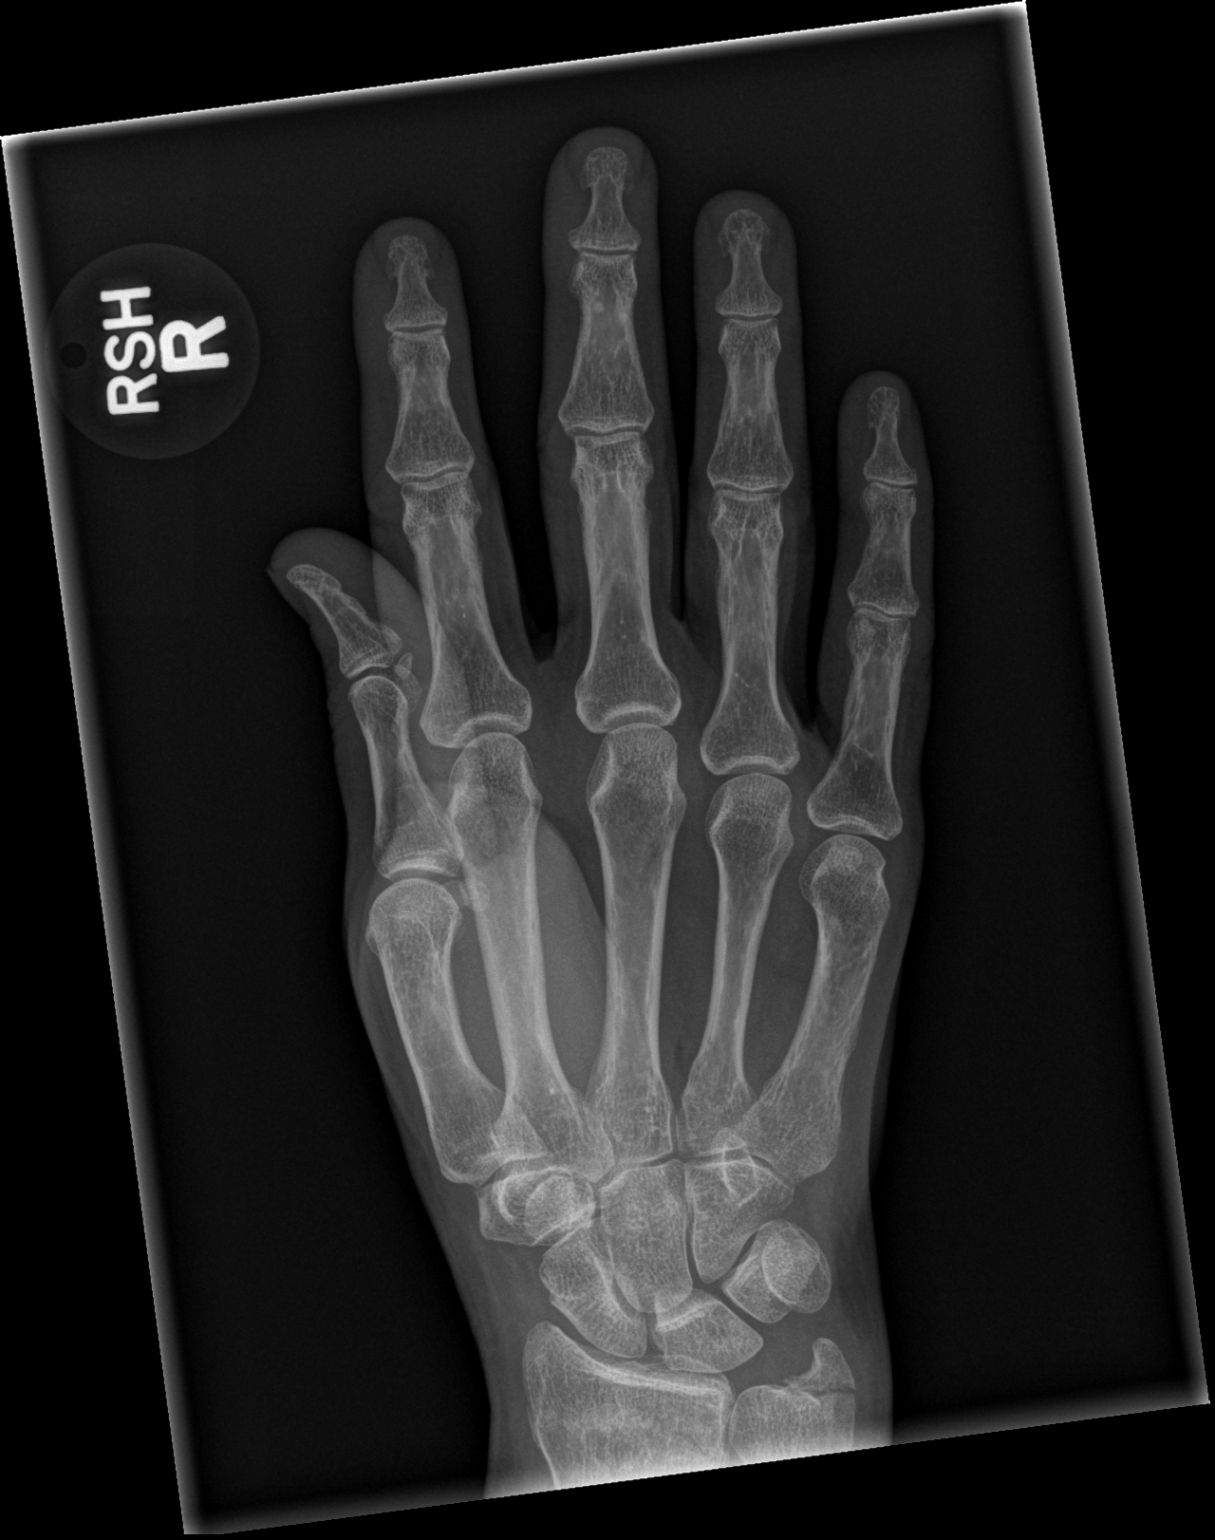

[x hand obl right]
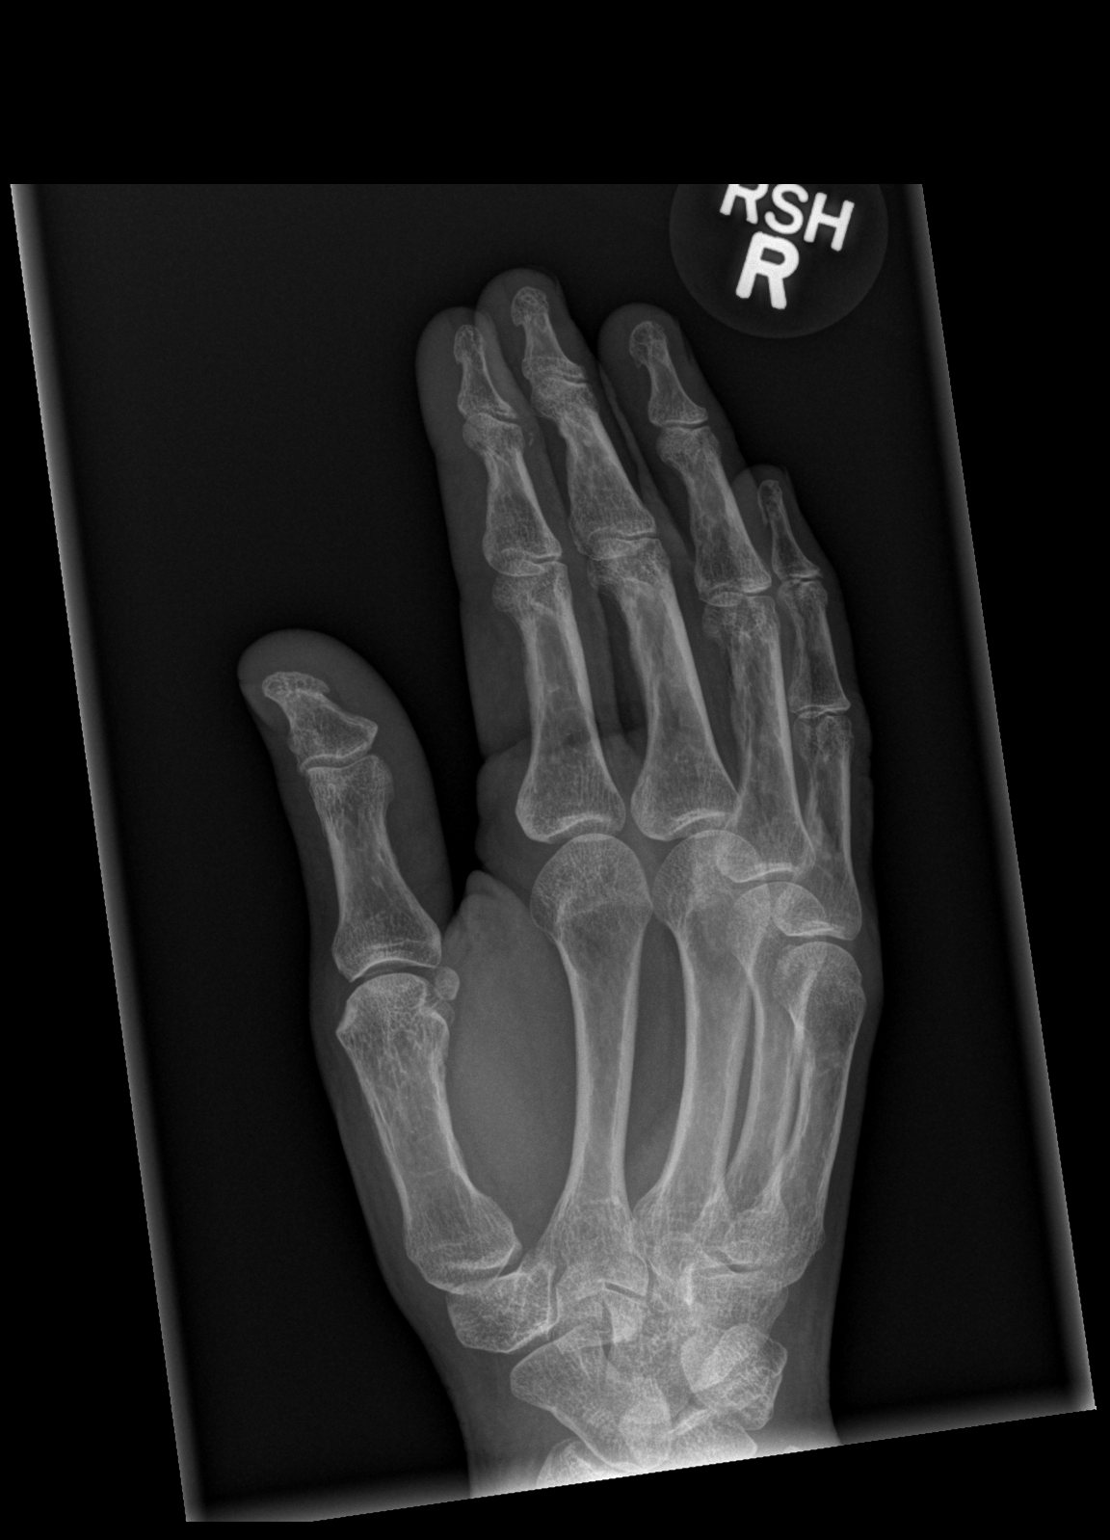

[x hand lat right]
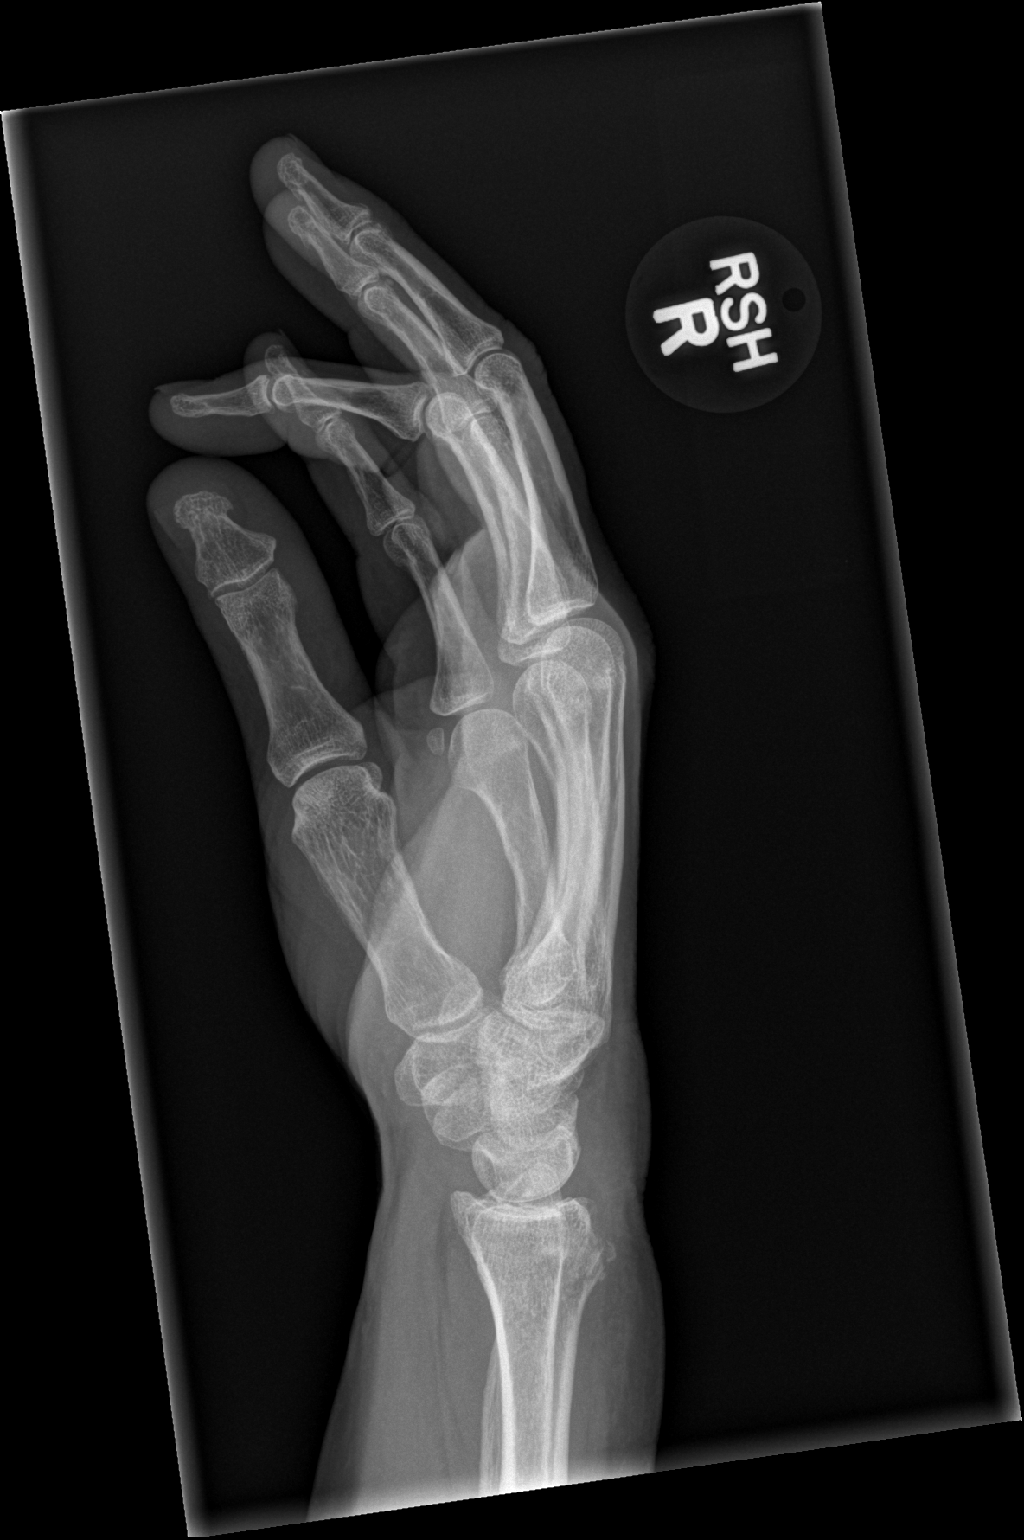

[3 of 3 positions shown; findings below may reference images not displayed]

FINDINGS: Frontal, oblique and lateral views were obtained. There is a
fracture of the distal radial metaphysis with impaction at the
distal radial fracture site. There is avulsion of the ulnar styloid.
No other fractures are evident. There is no appreciable dislocation.
Joint spaces appear normal. No erosive change.
IMPRESSION: Impacted fracture distal radial metaphysis. Avulsion ulnar styloid.
No other fracture seen elsewhere. No dislocation. No apparent
arthropathy.

## 2017-04-07 IMAGING — DX DG WRIST COMPLETE 3+V*R*
5 series · 5 of 5 positions shown · non-contrast
Comparison: Right hand June 17, 2011

CLINICAL DATA: Pain following fall

EXAM:
RIGHT WRIST - COMPLETE 3+ VIEW

[x wrist pa right]
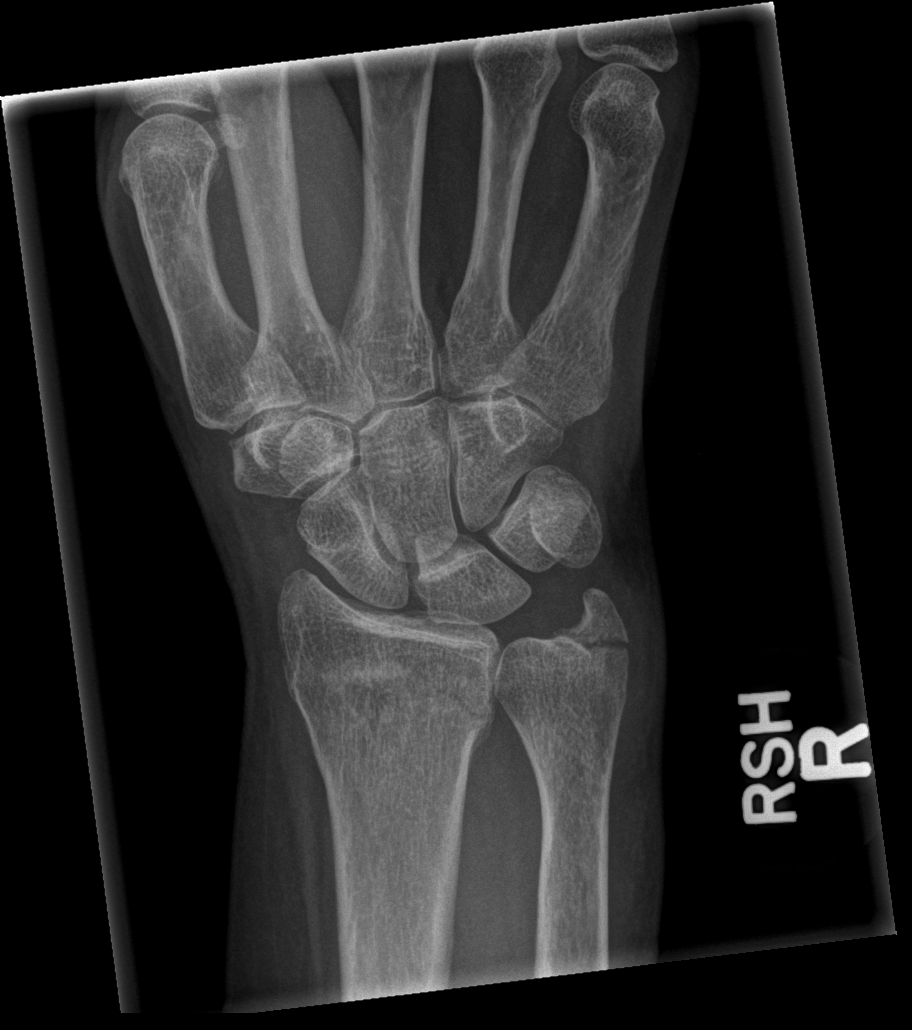

[x wrist obl right]
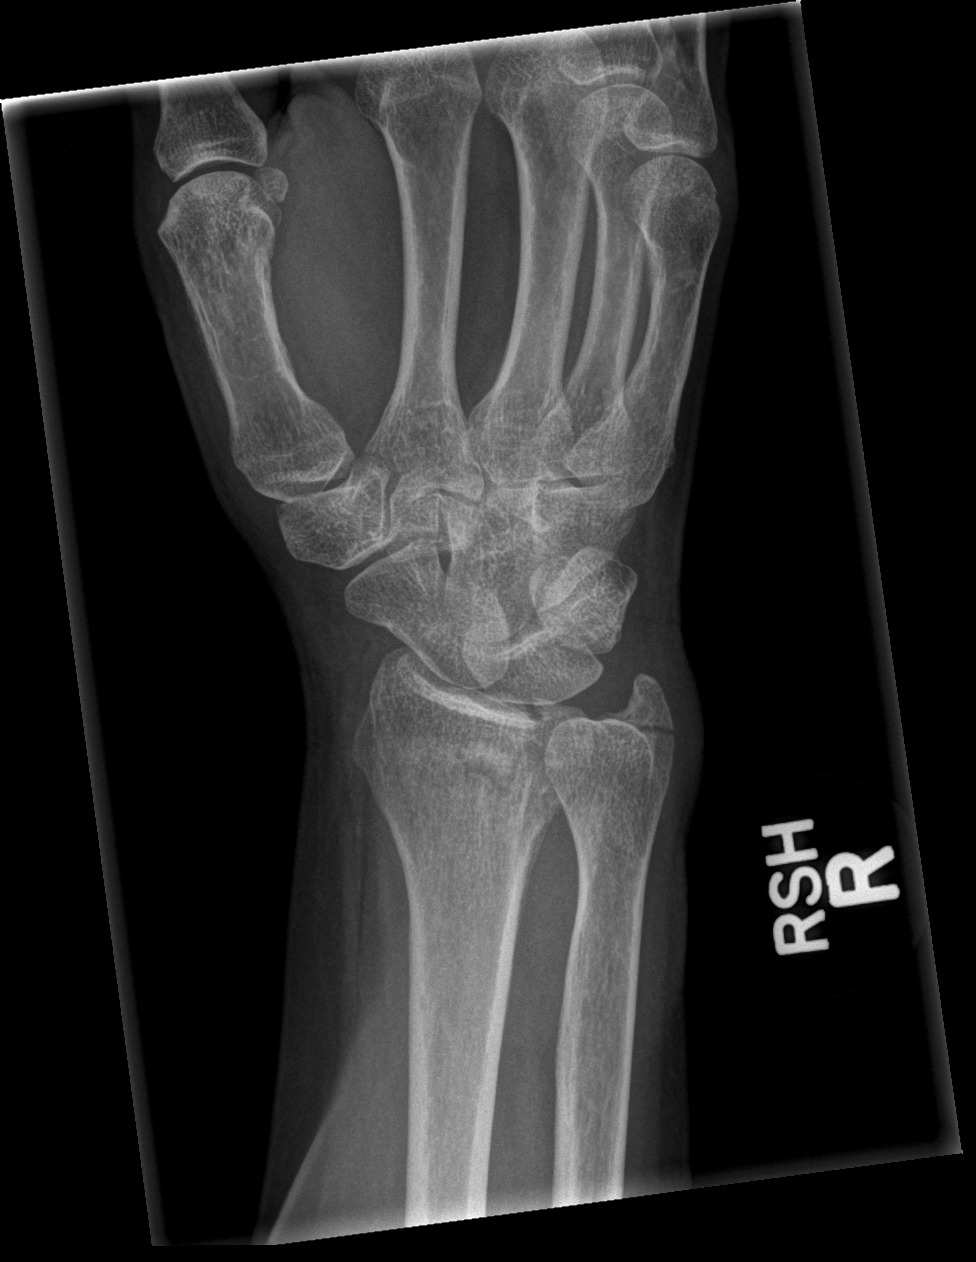

[x wrist lat right]
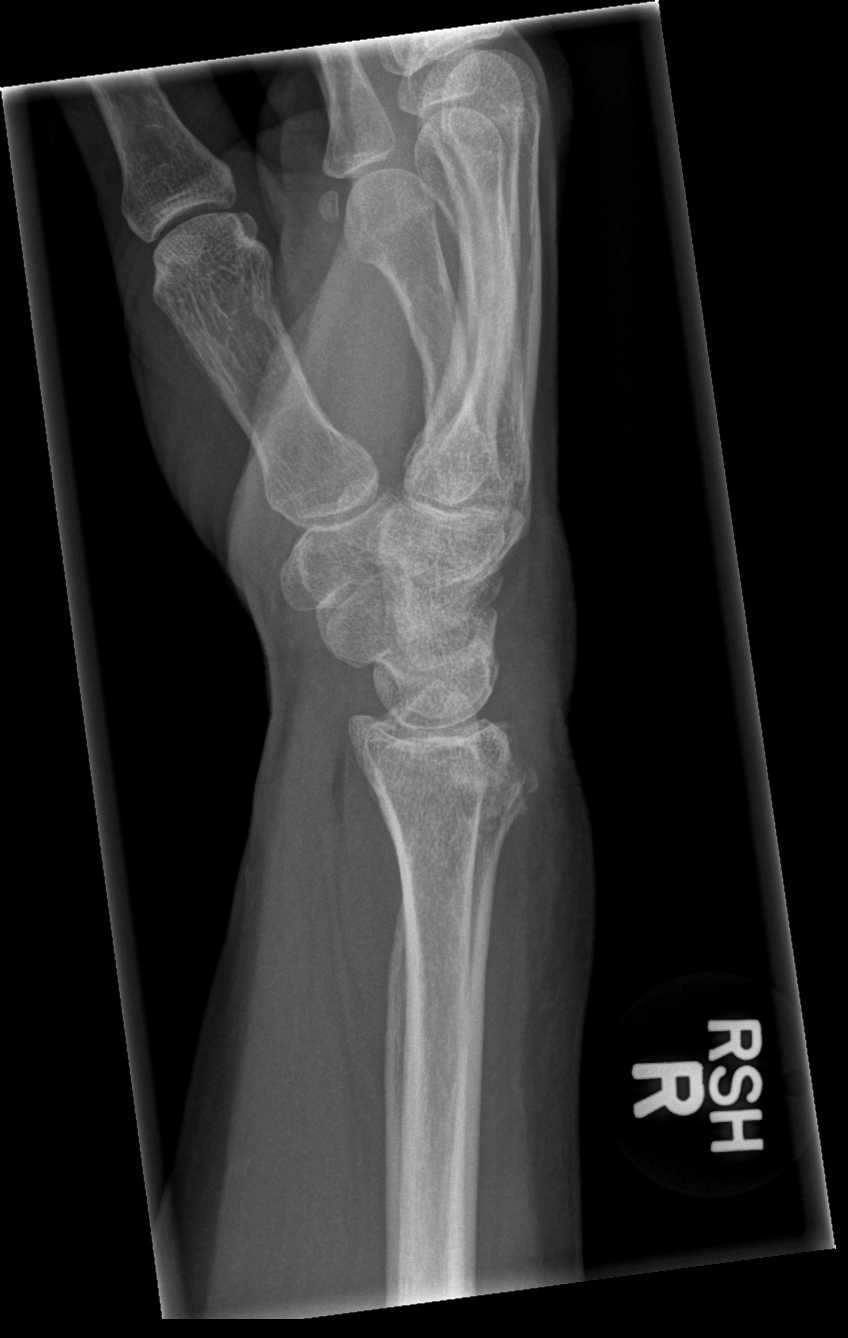

[x wrist navicular view right (1 of 2)]
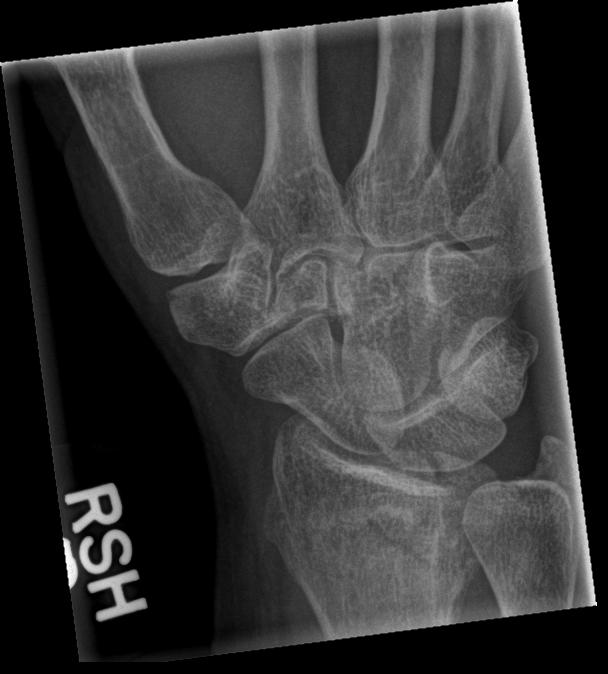

[x wrist navicular view right (2 of 2)]
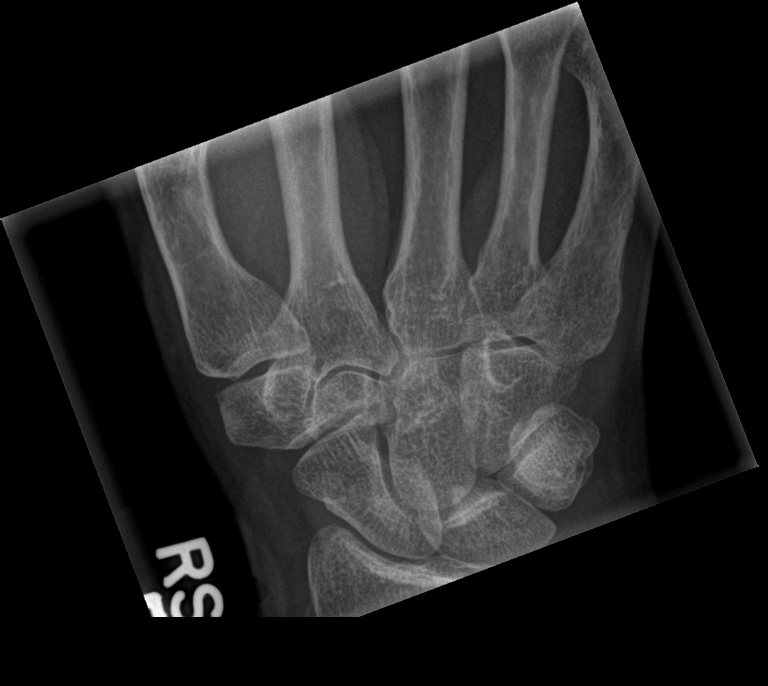

[5 of 5 positions shown; findings below may reference images not displayed]

FINDINGS: Frontal, oblique, lateral, and ulnar deviation scaphoid images were
obtained. There is a fracture of the distal radial metaphysis with
mild impaction at the radial fracture site. There is avulsion of the
ulnar styloid. No other fractures are evident. No dislocation. Joint
spaces appear unremarkable. No erosive change.
IMPRESSION: Fracture distal radial metaphysis with mild impaction at the radial
fracture site. Avulsion of the ulnar styloid. No dislocation.

## 2017-04-13 DIAGNOSIS — I509 Heart failure, unspecified: Secondary | ICD-10-CM | POA: Diagnosis not present

## 2017-04-13 DIAGNOSIS — N183 Chronic kidney disease, stage 3 (moderate): Secondary | ICD-10-CM | POA: Diagnosis not present

## 2017-04-13 DIAGNOSIS — Z720 Tobacco use: Secondary | ICD-10-CM | POA: Diagnosis not present

## 2017-04-13 IMAGING — DX DG CHEST 2V
2 series · 2 of 2 positions shown · non-contrast
Comparison: Chest radiograph May 25, 2016

CLINICAL DATA: Mid chest pain beginning today. Unable to void
without burning.

EXAM:
CHEST  2 VIEW

[chest pa]
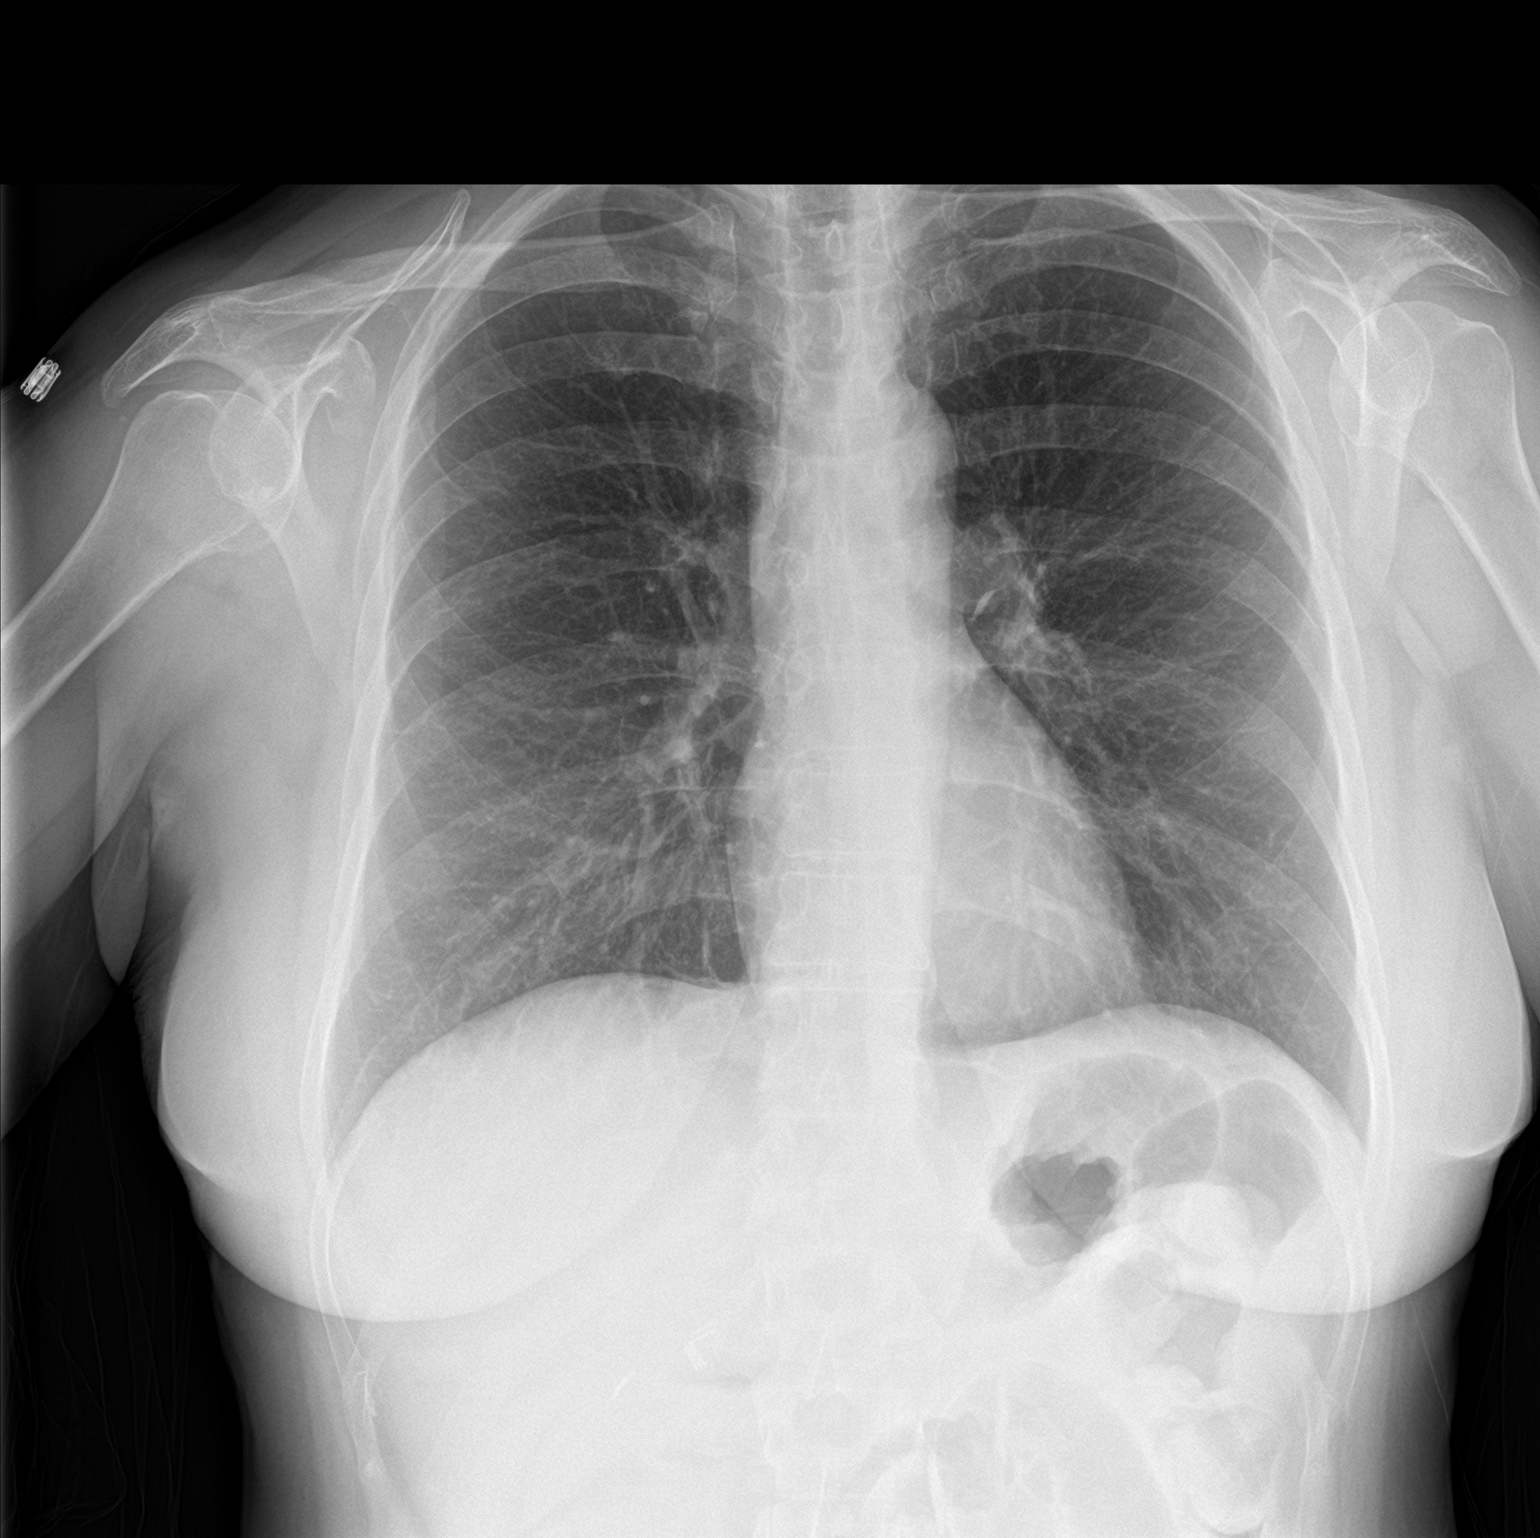

[chest lat]
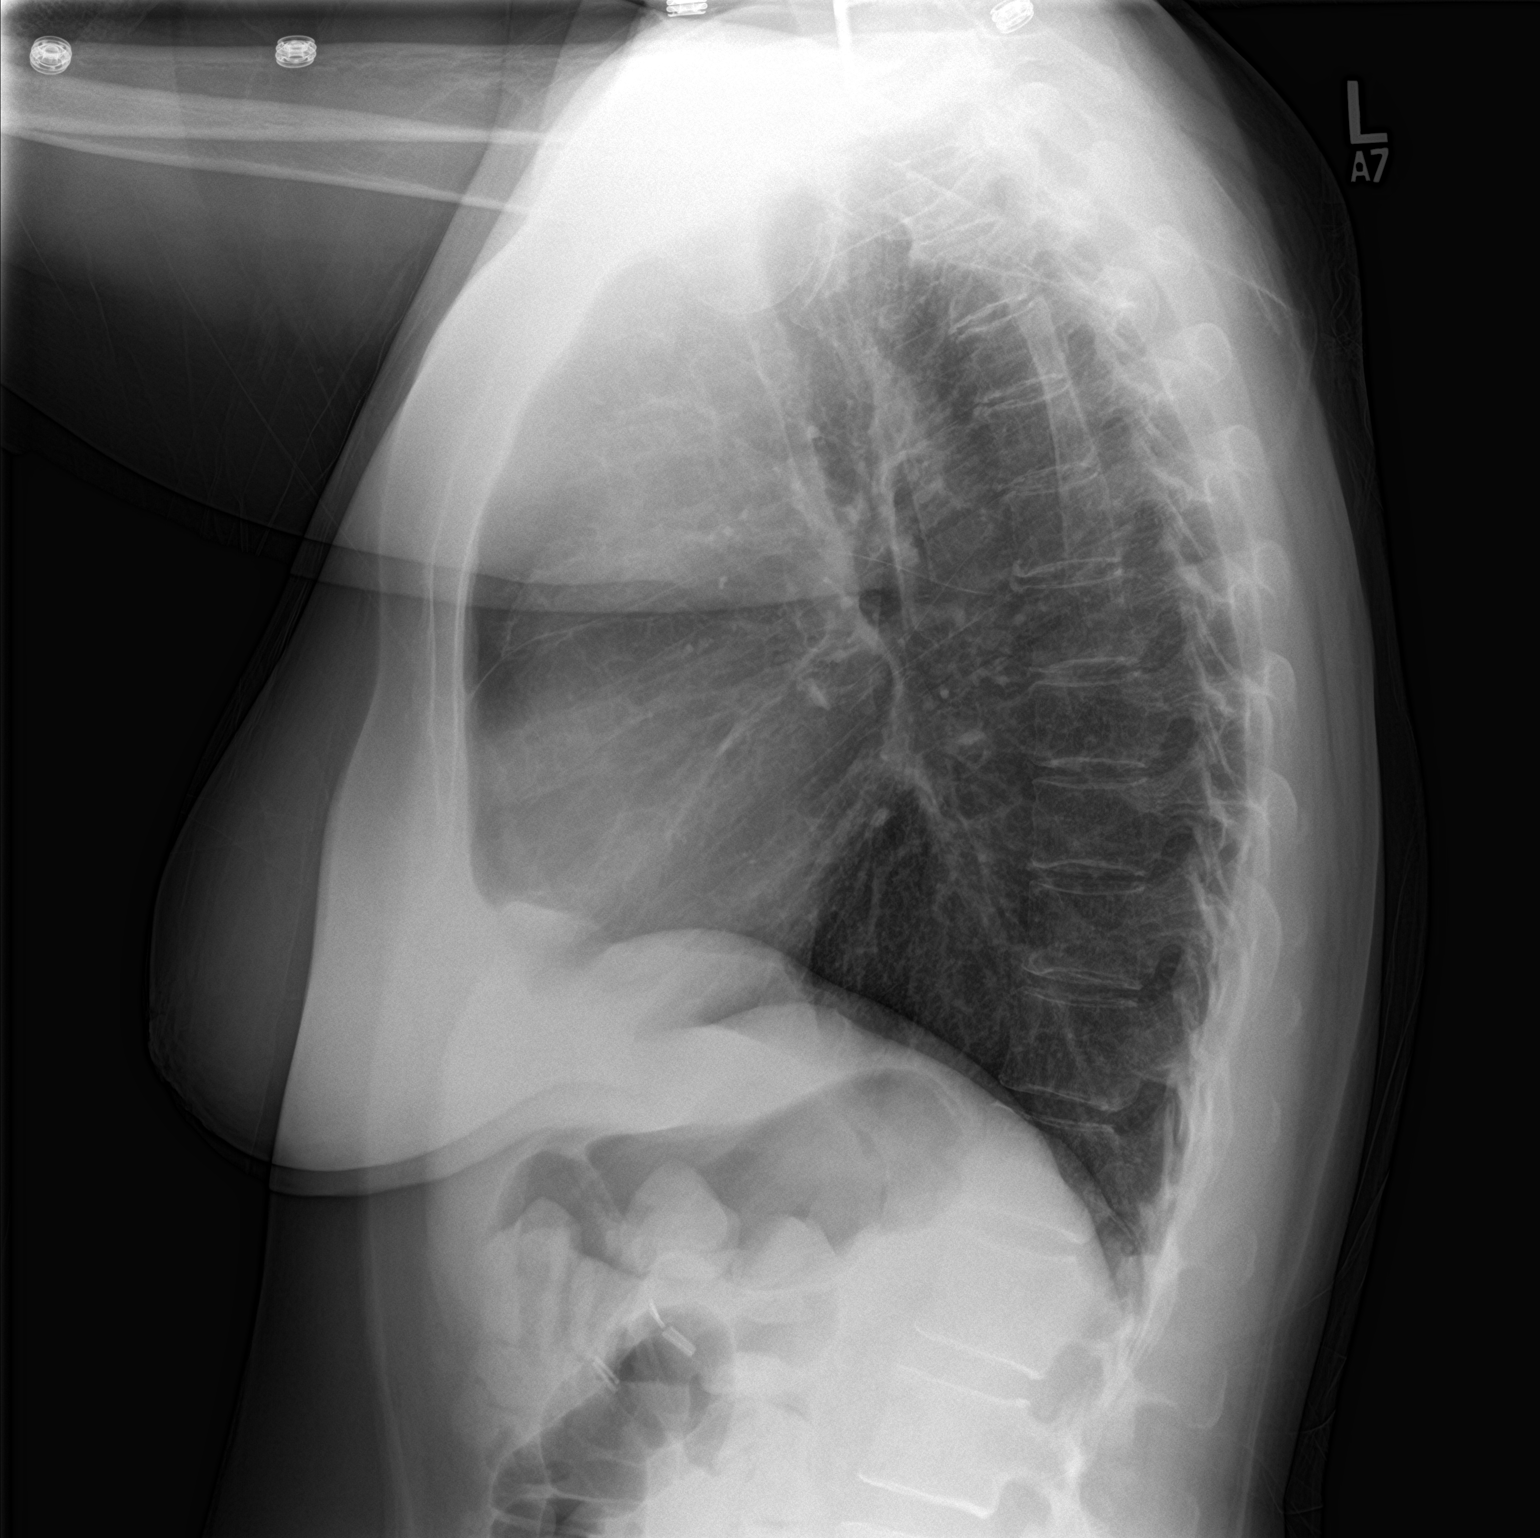

[2 of 2 positions shown; findings below may reference images not displayed]

FINDINGS: Cardiomediastinal silhouette is normal. No pleural effusions or
focal consolidations. Trachea projects midline and there is no
pneumothorax. Soft tissue planes and included osseous structures are
non-suspicious. ACDF. Surgical clips in the included right abdomen
compatible with cholecystectomy.
IMPRESSION: Normal chest.

## 2017-04-13 IMAGING — CT CT RENAL STONE PROTOCOL
2 of 3 series · 11 of 46 positions shown, 12 images · non-contrast
Comparison: 02/04/2016.  09/19/2015.

CLINICAL DATA: Right flank pain.  Assess for kidney stones.

EXAM:
CT ABDOMEN AND PELVIS WITHOUT CONTRAST
TECHNIQUE: Multidetector CT imaging of the abdomen and pelvis was performed
following the standard protocol without IV contrast.

[Series 201: stone study, idose (2) · axial · 0.79mm/px · z∈[-779,-374]mm · 8 of 95 slices shown, 9 images]
[im 7/95  soft-tissue]
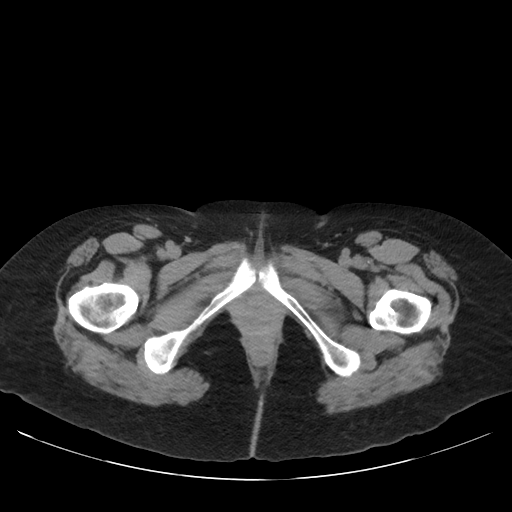
[im 7/95  bone]
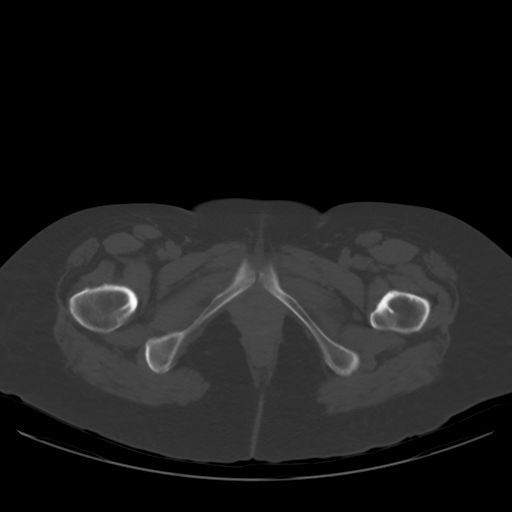
[im 19/95  soft-tissue]
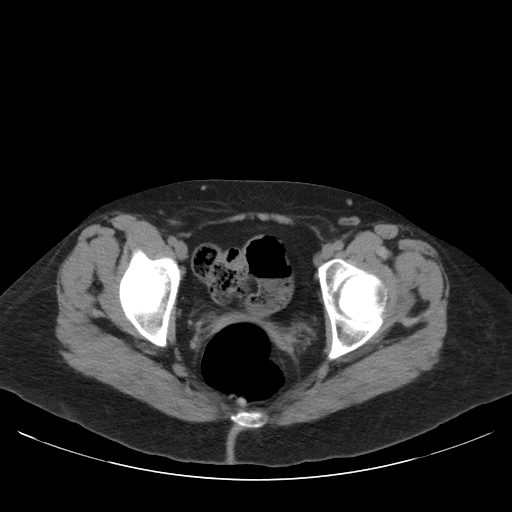
[im 31/95  soft-tissue]
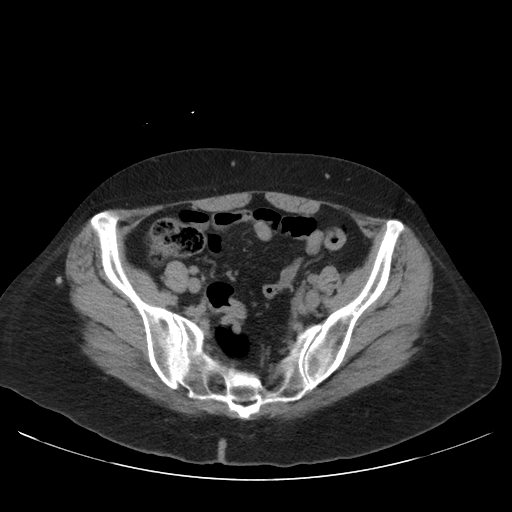
[im 43/95  soft-tissue]
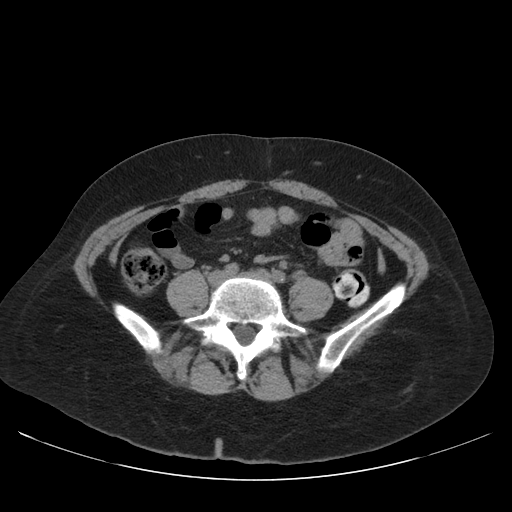
[im 52/95  soft-tissue]
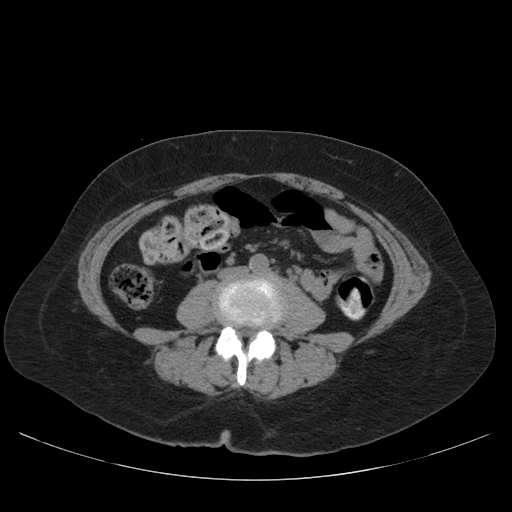
[im 64/95  soft-tissue]
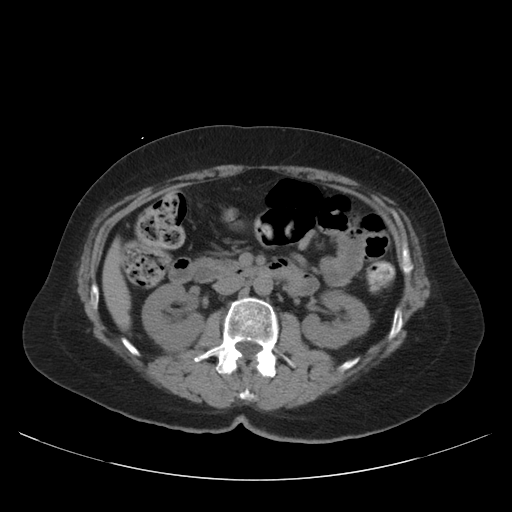
[im 76/95  soft-tissue]
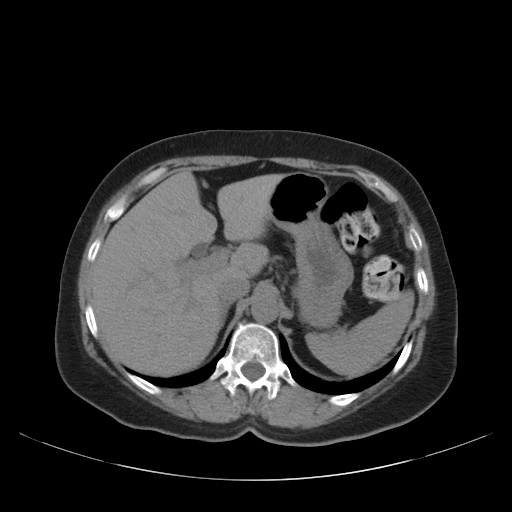
[im 88/95  soft-tissue]
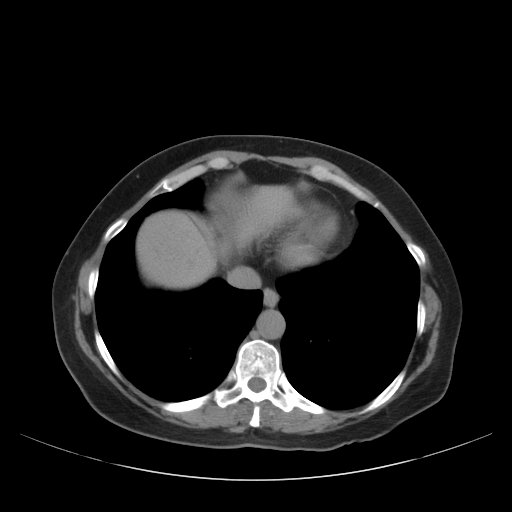

[Series 203: coronals, idose (2) · coronal · 0.45mm/px · 3 of 124 slices shown]
[im 42/124  soft-tissue]
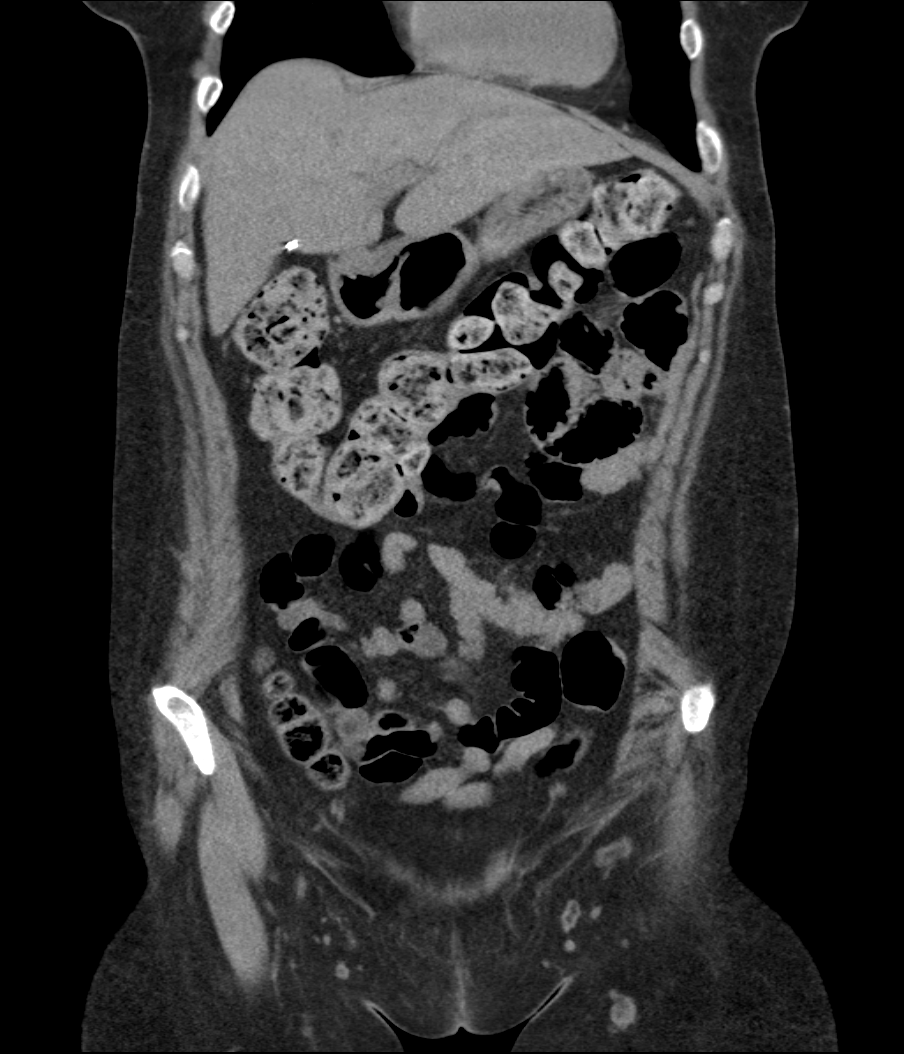
[im 55/124  soft-tissue]
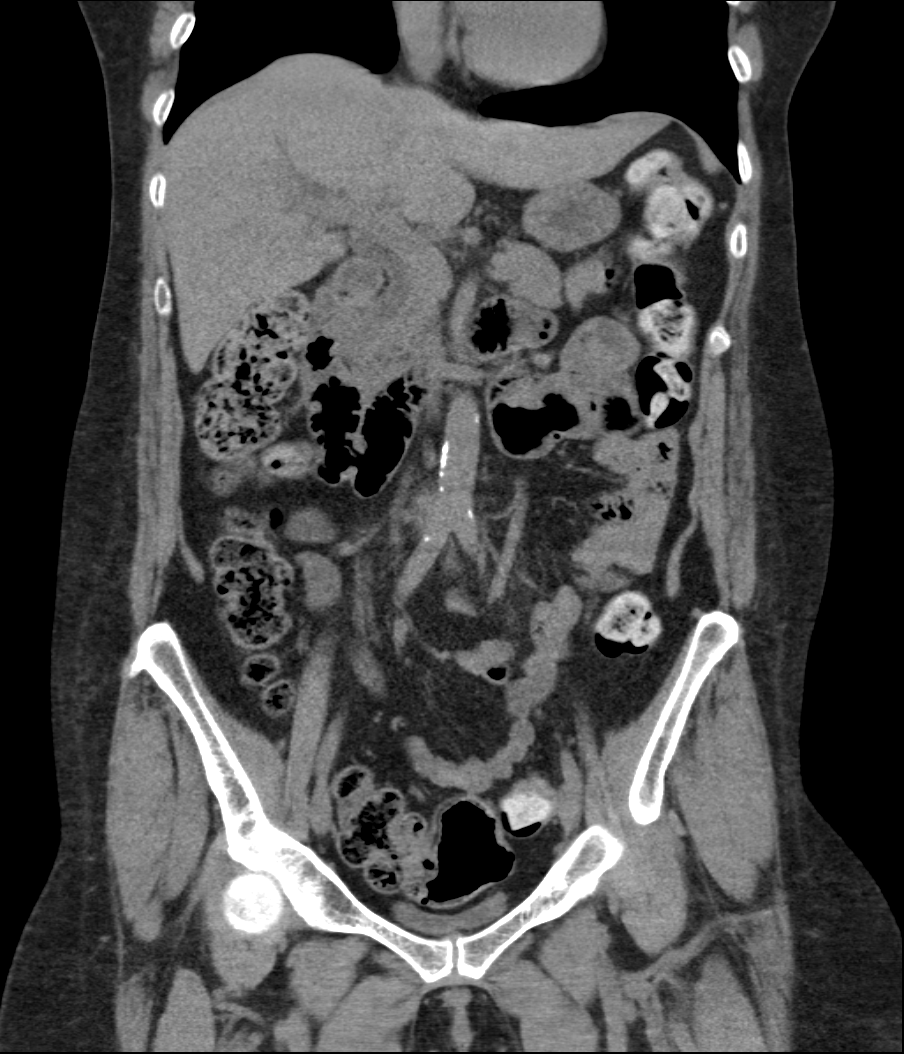
[im 69/124  soft-tissue]
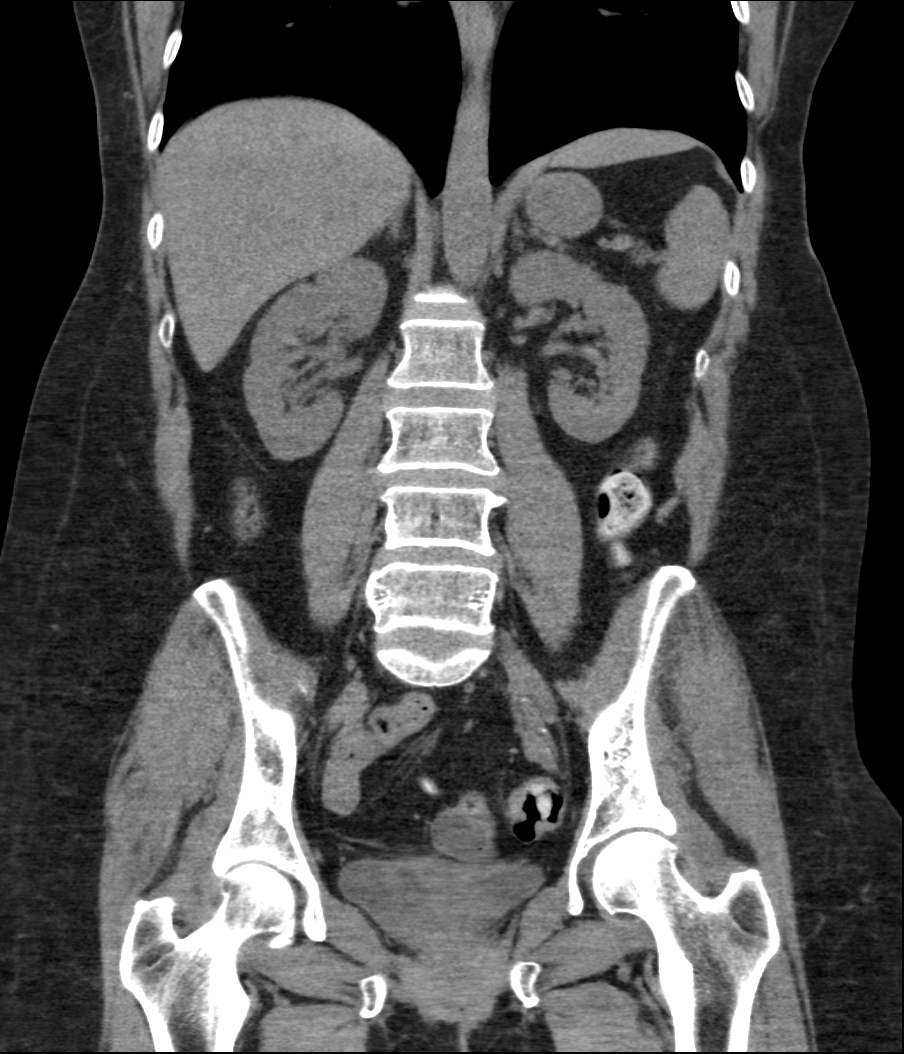

[11 of 46 positions shown; findings below may reference images not displayed]

FINDINGS: Lower chest: Mild chronic scarring at the lung bases. No active
process.

Hepatobiliary: Liver appears normal without contrast. Previous
cholecystectomy.

Pancreas: Normal

Spleen: Normal

Adrenals/Urinary Tract: Adrenal glands are normal. Kidneys are
normal. No cyst, mass, stone or hydronephrosis. No evidence of
passing stone. No stone in the bladder.

Stomach/Bowel: Fairly large amount of gas and stool in the colon.
Otherwise unremarkable.

Vascular/Lymphatic: Aortic atherosclerosis. No aneurysm. IVC
unremarkable. No adenopathy.

Reproductive: Previous hysterectomy.

Other: No free fluid or air.

Musculoskeletal: Negative
IMPRESSION: No urinary tract stone disease. No evidence of other urinary tract
pathology.

Previous cholecystectomy and hysterectomy.

Fairly large amount of gas and stool on the colon. No evidence of
ileus, obstruction, free air or free fluid.

Aortic atherosclerosis.

## 2017-04-16 ENCOUNTER — Emergency Department (HOSPITAL_COMMUNITY)
Admission: EM | Admit: 2017-04-16 | Discharge: 2017-04-16 | Disposition: A | Discharge status: Home / Self Care | Payer: Medicare Other | Attending: Emergency Medicine | Admitting: Emergency Medicine

## 2017-04-16 ENCOUNTER — Encounter (HOSPITAL_COMMUNITY): Payer: Self-pay | Admitting: Emergency Medicine

## 2017-04-16 ENCOUNTER — Emergency Department (HOSPITAL_COMMUNITY): Payer: Medicare Other

## 2017-04-16 DIAGNOSIS — Y999 Unspecified external cause status: Secondary | ICD-10-CM | POA: Insufficient documentation

## 2017-04-16 DIAGNOSIS — G8929 Other chronic pain: Secondary | ICD-10-CM | POA: Insufficient documentation

## 2017-04-16 DIAGNOSIS — W01198A Fall on same level from slipping, tripping and stumbling with subsequent striking against other object, initial encounter: Secondary | ICD-10-CM | POA: Insufficient documentation

## 2017-04-16 DIAGNOSIS — I509 Heart failure, unspecified: Secondary | ICD-10-CM | POA: Diagnosis not present

## 2017-04-16 DIAGNOSIS — S2232XA Fracture of one rib, left side, initial encounter for closed fracture: Secondary | ICD-10-CM | POA: Insufficient documentation

## 2017-04-16 DIAGNOSIS — E039 Hypothyroidism, unspecified: Secondary | ICD-10-CM | POA: Insufficient documentation

## 2017-04-16 DIAGNOSIS — I10 Essential (primary) hypertension: Secondary | ICD-10-CM | POA: Diagnosis not present

## 2017-04-16 DIAGNOSIS — Y9389 Activity, other specified: Secondary | ICD-10-CM | POA: Diagnosis not present

## 2017-04-16 DIAGNOSIS — Y929 Unspecified place or not applicable: Secondary | ICD-10-CM | POA: Diagnosis not present

## 2017-04-16 DIAGNOSIS — S299XXA Unspecified injury of thorax, initial encounter: Secondary | ICD-10-CM | POA: Diagnosis not present

## 2017-04-16 DIAGNOSIS — F17210 Nicotine dependence, cigarettes, uncomplicated: Secondary | ICD-10-CM | POA: Insufficient documentation

## 2017-04-16 DIAGNOSIS — Z79899 Other long term (current) drug therapy: Secondary | ICD-10-CM | POA: Diagnosis not present

## 2017-04-16 DIAGNOSIS — I2510 Atherosclerotic heart disease of native coronary artery without angina pectoris: Secondary | ICD-10-CM | POA: Diagnosis not present

## 2017-04-16 DIAGNOSIS — R0781 Pleurodynia: Secondary | ICD-10-CM | POA: Diagnosis not present

## 2017-04-16 MED ORDER — OXYCODONE-ACETAMINOPHEN 5-325 MG PO TABS
1.0000 | ORAL_TABLET | Freq: Once | ORAL | Status: AC
  Administered 2017-04-16: 1 via ORAL
  Filled 2017-04-16: qty 1

## 2017-04-16 MED ORDER — OXYCODONE-ACETAMINOPHEN 5-325 MG PO TABS: 1.0000 | ORAL_TABLET | ORAL | 0 refills | Status: DC | PRN

## 2017-04-16 NOTE — ED Triage Notes (Signed)
Pt c/o tripping over a baby gate and falling Thursday. States she is having left sided rib pain with movement, bruising noted to the area.

## 2017-04-16 NOTE — Discharge Instructions (Signed)
Take the prescribed medication as directed.  Can use motrin between doses if needed for added relief.  Use incentive spirometer at home. Follow-up with your primary care doctor if any ongoing issues. Return to the ED for new or worsening symptoms.

## 2017-04-16 NOTE — ED Provider Notes (Signed)
[facility] DEPT Provider Note   CSN: [id number] Arrival date & time: 04/16/17  1818   By signing my name below, I, Eunice Blase, attest that this documentation has been prepared under the direction and in the presence of Quincy Carnes, PA-C. Electronically signed, Eunice Blase, ED Scribe. 04/16/17. 8:10 PM.   History   Chief Complaint Chief Complaint  Patient presents with  . Fall  . Rib Injury   The history is provided by the patient and medical records. No language interpreter was used.    [name] is a 51 y.o. female with h/o chronic back pain presenting to the Emergency Department concerning L rib pain s/p a fall sustained ~4 PM 2 days ago. No head trauma/ LOC Associated bruising. She describes 8/10, constant aches worse with certain movements, cough and deep breathing. No PTA medications. No swelling or any other complaints noted at this time.   Past Medical History:  Diagnosis Date  . Addison's disease ([place])   . Adrenal insufficiency ([place])    diagnosed 2012  . Aneurysm ([place])   . Anxiety   . Astigmatism   . CAD (coronary artery disease)    Cath 2008 EF normal. RCA 50-60, Septal 50%. Myoview 3/12: EF 53% normal perfusion  . Cardiac arrest ([place])    2/2 adissonian crisis  . Cardiomyopathy    resolved  . Chest pain    chronicc  . CHF (congestive heart failure) ([place])   . Chronic back pain   . Chronic diarrhea   . Concussion    sept 28th 2014  . Gastroparesis   . HTN (hypertension)   . Hyperlipidemia   . Hypothyroidism   . Mitral valve prolapse   . Nondiabetic gastroparesis   . QT prolongation   . Tobacco abuse    down to 2 cigarettes per day  . Vertigo     Patient Active Problem List   Diagnosis Date Noted  . Myositis 05/11/2016  . Anxiety 05/26/2015  . Tremors of nervous system 05/26/2015  . Neck pain 05/26/2015  . Hot flushes, perimenopausal 07/24/2013  . CAD (coronary artery disease) 04/11/2012  . GERD (gastroesophageal reflux disease)  04/11/2012  . Addison's disease ([place]) 03/03/2012  . QT prolongation 12/15/2010  . Palpitations 01/01/2009  . Hypothyroidism 12/31/2008  . Hyperlipidemia 12/31/2008  . OVERWEIGHT/OBESITY 12/31/2008  . Essential hypertension 12/31/2008    Past Surgical History:  Procedure Laterality Date  . ABDOMINAL HYSTERECTOMY    . CARDIAC CATHETERIZATION  03/2007   showed 60% lesion in the right coronary artery  . CHOLECYSTECTOMY    . LEFT HEART CATHETERIZATION WITH CORONARY ANGIOGRAM N/A 04/13/2012   Procedure: LEFT HEART CATHETERIZATION WITH CORONARY ANGIOGRAM;  Surgeon: Hillary Bow, MD;  Location: [facility] CATH LAB;  Service: Cardiovascular;  Laterality: N/A;  . SPINE SURGERY    . VARICOSE VEIN SURGERY    . VESICOVAGINAL FISTULA CLOSURE W/ TAH      OB History    Gravida Para Term Preterm AB Living   3 2 2  0 1 2   SAB TAB Ectopic Multiple Live Births   1               Home Medications    Prior to Admission medications   Medication Sig Start Date End Date Taking? Authorizing Provider  amLODipine (NORVASC) 5 MG tablet TAKE 1 TABLET (5 MG TOTAL) BY MOUTH DAILY. Patient taking differently: TAKE 2 TABLET (10 MG TOTAL) BY MOUTH DAILY. 07/25/15   Pixie Casino, MD  aspirin 81 MG chewable tablet Chew 81 mg by mouth daily.     [provider]  atorvastatin (LIPITOR) 20 MG tablet Take 20 mg by mouth every morning.     [provider]  cholecalciferol (VITAMIN D) 1000 UNITS tablet Take 1,000 Units by mouth daily.    [provider]  ciprofloxacin (CIPRO) 500 MG tablet Take 1 tablet (500 mg total) by mouth 2 (two) times daily. Patient not taking: Reported on 03/29/2017 03/24/17   Cresenzo-Dishmon, Joaquim Lai, CNM  cyclobenzaprine (FLEXERIL) 5 MG tablet Take 2 tablets (10 mg total) by mouth 3 (three) times daily as needed for muscle spasms. 05/15/16   Kalman Shan Ratliff, DO  furosemide (LASIX) 20 MG tablet Take 20 mg by mouth daily as needed for fluid.     [provider]  hydrochlorothiazide (HYDRODIURIL) 12.5 MG tablet Take 6.25 mg by mouth daily as needed (for High Blood Pressure >145/98). Second line medication.    [provider]  hydrocortisone (CORTEF) 10 MG tablet Take 10-20 mg by mouth 2 (two) times daily. Takes 20 mg in the morning and 10 mg at night 04/07/16   [provider]  levothyroxine (SYNTHROID, LEVOTHROID) 75 MCG tablet Take 75 mcg by mouth daily before breakfast.    [provider]  LORazepam (ATIVAN) 2 MG tablet Take 2 mg by mouth 4 (four) times daily as needed for anxiety. 05/06/16   [provider]  meclizine (ANTIVERT) 25 MG tablet 1 or 2 tabs PO q8h prn dizziness Patient taking differently: Take 25-50 mg by mouth 3 (three) times daily as needed (for vertigo).  04/16/16   Francine Graven, DO  metroNIDAZOLE (METROGEL VAGINAL) 0.75 % vaginal gel Place 1 Applicatorful vaginally at bedtime. For 5 nights Patient not taking: Reported on 03/29/2017 03/24/17   Cresenzo-Dishmon, Joaquim Lai, CNM  nitroGLYCERIN (NITROSTAT) 0.4 MG SL tablet Place 0.4 mg under the tongue every 5 (five) minutes as needed for chest pain.    [provider]  oxycodone (ROXICODONE) 30 MG immediate release tablet Take 30 mg by mouth every 4 (four) hours as needed for pain.    [provider]  pantoprazole (PROTONIX) 20 MG tablet Take 1 tablet (20 mg total) by mouth daily. Patient taking differently: Take 20 mg by mouth daily as needed for indigestion.  02/04/16   Ward, Ozella Almond, PA-C  promethazine (PHENERGAN) 25 MG tablet Take 25 mg by mouth every 6 (six) hours as needed for nausea or vomiting.    [provider]  vitamin C (ASCORBIC ACID) 500 MG tablet Take 500 mg by mouth daily.    [provider]  zolpidem (AMBIEN) 10 MG tablet Take 10 mg by mouth at bedtime. 05/06/16   [provider]    Family History Family History  Problem Relation Age of Onset  . Cancer Father        Colon    . Coronary artery disease Unknown   . Heart attack Mother     Social History Social History  Substance Use Topics  . Smoking status: Current Every Day Smoker    Packs/day: 0.25    Years: 10.00    Types: Cigarettes  . Smokeless tobacco: Never Used     Comment: smokes 2 a day  . Alcohol use No     Allergies   Bee venom; Doxycycline; Erythromycin; Ibuprofen; Morphine and related; Penicillins; Sulfa antibiotics; Cephalexin; Dust mite extract; Tramadol; Other; and Sulfonamide derivatives   Review of Systems Review of Systems  Constitutional: Negative for diaphoresis and fever.  Respiratory: Negative for shortness of breath.   Gastrointestinal: Negative for nausea and vomiting.  Musculoskeletal: Positive for arthralgias.  Skin: Negative for color change and wound.  Neurological: Negative for weakness and numbness.  All other systems reviewed and are negative.    Physical Exam Updated Vital Signs BP 119/77 (BP Location: Right Arm)   Pulse 93   Temp 98.2 F (36.8 C) (Oral)   Resp 20   Ht 5\' 5"  (1.651 m)   Wt 193 lb (87.5 kg)   SpO2 96%   BMI 32.12 kg/m   Physical Exam  Constitutional: She is oriented to person, place, and time. She appears well-developed and well-nourished.  HENT:  Head: Normocephalic and atraumatic.  Mouth/Throat: Oropharynx is clear and moist.  Eyes: Pupils are equal, round, and reactive to light. Conjunctivae and EOM are normal.  Neck: Normal range of motion.  Cardiovascular: Normal rate, regular rhythm and normal heart sounds.   Pulmonary/Chest: Effort normal and breath sounds normal. She has no wheezes. She has no rhonchi. She exhibits tenderness.  Tenderness along the left mid to lower ribs, there is some areas of bruising noted, no crepitus or flail segment, obvious pain with deep breathing but lungs are clear, no distress, able to speak in full sentences  Abdominal: Soft. Bowel sounds are normal.  Musculoskeletal: Normal range of motion.   Neurological: She is alert and oriented to person, place, and time.  Skin: Skin is warm and dry.  Psychiatric: She has a normal mood and affect.  Nursing note and vitals reviewed.    ED Treatments / Results  DIAGNOSTIC STUDIES: Oxygen Saturation is 96% on RA, adequate by my interpretation.    COORDINATION OF CARE: 8:08 PM-Discussed next steps with pt. Pt verbalized understanding and is agreeable with the plan. Will order medications.   Labs (all labs ordered are listed, but only abnormal results are displayed) Labs Reviewed - No data to display  EKG  EKG Interpretation None       Radiology Dg Ribs Unilateral W/chest Left  Result Date: 04/16/2017 CLINICAL DATA:  Initial evaluation for acute trauma, fall, left-sided rib pain. EXAM: LEFT RIBS AND CHEST - 3+ VIEW COMPARISON:  Prior radiograph from 03/29/2017. FINDINGS: Cardiac and mediastinal silhouettes are stable in size and contour, and remain within normal limits. Minimal linear atelectasis present at the right lung base. No focal infiltrates. No pulmonary edema or pleural effusion. No pneumothorax. Dedicated views of the left ribs were performed. There is subtle minimal cortical regularity of the left lateral eighth rib, which may reflect a subtle acute nondisplaced rib fracture. No other acute rib fracture identified. No other acute osseus abnormality. Cervical ACDF noted. IMPRESSION: 1. Subtle cortical irregularity at the lateral left eighth rib, suspicious for possible acute nondisplaced rib fracture. Correlation with site of pain recommended. 2. No other acute osseous abnormality. 3. Mild right basilar atelectasis. No other active cardiopulmonary disease. Electronically Signed   By: Jeannine Boga M.D.   On: 04/16/2017 19:49    Procedures Procedures (including critical care time)  Medications Ordered in ED Medications - No data to display   Initial Impression / Assessment and Plan / ED Course  I have reviewed the  triage vital signs and the nursing notes.  Pertinent labs & imaging results that were available during my care of the patient were reviewed by me and considered in my medical decision making (see chart for details).  51 year old female here with fall a few  days ago. Fell onto the left ribs. No head injury or loss of consciousness. Presents here today with continued left rib pain. She does have tenderness as well as bruising on exam. Lungs are clear. Vitals are stable. Rib films obtained, likely fracture at the lateral left eighth rib. No evidence of pneumothorax. This is likely etiology of patient's pain. We'll plan for incentive spirometer, pain control. Discussed supportive measures at home. Follow-up with PCP if any ongoing issues.  Discussed plan with patient, she acknowledged understanding and agreed with plan of care.  Return precautions given for new or worsening symptoms.  Final Clinical Impressions(s) / ED Diagnoses   Final diagnoses:  Rib pain on left side  Closed fracture of one rib of left side, initial encounter    New Prescriptions New Prescriptions   OXYCODONE-ACETAMINOPHEN (PERCOCET) 5-325 MG TABLET    Take 1 tablet by mouth every 4 (four) hours as needed.   I personally performed the services described in this documentation, which was scribed in my presence. The recorded information has been reviewed and is accurate.   Larene Pickett, PA-C 04/16/17 2031    Quintella Reichert, MD 04/16/17 [phone]

## 2017-04-16 NOTE — ED Notes (Signed)
RN reviewed how to use incentive spirometer with pt, pt verbalized understanding, no questions

## 2017-04-22 ENCOUNTER — Encounter: Payer: Self-pay | Admitting: Physician Assistant

## 2017-04-22 ENCOUNTER — Ambulatory Visit (INDEPENDENT_AMBULATORY_CARE_PROVIDER_SITE_OTHER): Payer: Medicare Other | Admitting: Physician Assistant

## 2017-04-22 VITALS — BP 110/72 | HR 92 | Ht 65.0 in | Wt 193.8 lb

## 2017-04-22 DIAGNOSIS — I10 Essential (primary) hypertension: Secondary | ICD-10-CM | POA: Diagnosis not present

## 2017-04-22 DIAGNOSIS — I25111 Atherosclerotic heart disease of native coronary artery with angina pectoris with documented spasm: Secondary | ICD-10-CM

## 2017-04-22 DIAGNOSIS — I209 Angina pectoris, unspecified: Secondary | ICD-10-CM | POA: Diagnosis not present

## 2017-04-22 DIAGNOSIS — E785 Hyperlipidemia, unspecified: Secondary | ICD-10-CM | POA: Diagnosis not present

## 2017-04-22 NOTE — Patient Instructions (Signed)
Medication Instructions:   No changes to current regimen.  Labwork:   none  Testing/Procedures:  none  Follow-Up:  6-9 months with Dr. Debara Pickett   If you need a refill on your cardiac medications before your next appointment, please call your pharmacy.

## 2017-04-22 NOTE — Progress Notes (Signed)
Cardiology Office Note    Date:  04/24/2017   ID:  [name], DOB 1965-12-28, MRN [id number]  PCP:  Redmond School, MD  Cardiologist:  Dr. Debara Pickett   Chief Complaint  Patient presents with  . Follow-up    seen for Dr. Debara Pickett    History of Present Illness:  [name] is a 51 y.o. female with PMH of HTN, HLD, tobacco abuse. In 2013, she was evaluated for chest pain with left heart cath which showed preserved LV function and no significant obstructive coronary artery disease. Previously has been reported there was a 50-60% stenosis in mid RCA which was noted to be completely resolved in 2013 favoring diagnosis of coronary spasm. She has a history of Addison's disease, hypothyroidism, prolonged QT interval and a remote history of nonischemic cardiomyopathy with EF 40-45% which later normalized.  Over the years, she has been seen multiple times in the emergency room for evaluation of chest pain. She is on low-dose calcium channel blocker due to concern for possible spasm in the past. Last echocardiogram obtained on 05/26/2015 showed EF 72-09%, grade 1 diastolic dysfunction. She presents today for cardiology office visit. She still occasionally notice some atypical chest discomfort, however it is quite rare and it does not appears to be associated with exertion. I'm in favor of continuing observation at this time. Otherwise, she has been doing well without any significant shortness of breath, lower extremity edema, orthopnea or PND.    Past Medical History:  Diagnosis Date  . Addison's disease ([place])   . Adrenal insufficiency ([place])    diagnosed 2012  . Aneurysm ([place])   . Anxiety   . Astigmatism   . CAD (coronary artery disease)    Cath 2008 EF normal. RCA 50-60, Septal 50%. Myoview 3/12: EF 53% normal perfusion  . Cardiac arrest ([place])    2/2 adissonian crisis  . Cardiomyopathy    resolved  . Chest pain    chronicc  . CHF (congestive heart failure) ([place])   . Chronic back pain     . Chronic diarrhea   . Concussion    sept 28th 2014  . Gastroparesis   . HTN (hypertension)   . Hyperlipidemia   . Hypothyroidism   . Mitral valve prolapse   . Nondiabetic gastroparesis   . QT prolongation   . Tobacco abuse    down to 2 cigarettes per day  . Vertigo     Past Surgical History:  Procedure Laterality Date  . ABDOMINAL HYSTERECTOMY    . CARDIAC CATHETERIZATION  03/2007   showed 60% lesion in the right coronary artery  . CHOLECYSTECTOMY    . LEFT HEART CATHETERIZATION WITH CORONARY ANGIOGRAM N/A 04/13/2012   Procedure: LEFT HEART CATHETERIZATION WITH CORONARY ANGIOGRAM;  Surgeon: Hillary Bow, MD;  Location: [facility] CATH LAB;  Service: Cardiovascular;  Laterality: N/A;  . SPINE SURGERY    . VARICOSE VEIN SURGERY    . VESICOVAGINAL FISTULA CLOSURE W/ TAH      Current Medications: Outpatient Medications Prior to Visit  Medication Sig Dispense Refill  . aspirin 81 MG chewable tablet Chew 81 mg by mouth daily.     Marland Kitchen atorvastatin (LIPITOR) 20 MG tablet Take 20 mg by mouth every morning.     . cholecalciferol (VITAMIN D) 1000 UNITS tablet Take 1,000 Units by mouth daily.    . cyclobenzaprine (FLEXERIL) 5 MG tablet Take 2 tablets (10 mg total) by mouth 3 (three) times daily as needed for muscle  spasms.    . furosemide (LASIX) 20 MG tablet Take 20 mg by mouth daily as needed for fluid.     . hydrochlorothiazide (HYDRODIURIL) 12.5 MG tablet Take 6.25 mg by mouth daily as needed (for High Blood Pressure >145/98). Second line medication.    . hydrocortisone (CORTEF) 10 MG tablet Take 10-20 mg by mouth 2 (two) times daily. Takes 20 mg in the morning and 10 mg at night    . levothyroxine (SYNTHROID, LEVOTHROID) 75 MCG tablet Take 75 mcg by mouth daily before breakfast.    . LORazepam (ATIVAN) 2 MG tablet Take 2 mg by mouth 4 (four) times daily as needed for anxiety.    . meclizine (ANTIVERT) 25 MG tablet 1 or 2 tabs PO q8h prn dizziness (Patient taking differently: Take 25-50 mg  by mouth 3 (three) times daily as needed (for vertigo). ) 15 tablet 0  . nitroGLYCERIN (NITROSTAT) 0.4 MG SL tablet Place 0.4 mg under the tongue every 5 (five) minutes as needed for chest pain.    Marland Kitchen oxycodone (ROXICODONE) 30 MG immediate release tablet Take 30 mg by mouth every 4 (four) hours as needed for pain.    Marland Kitchen oxyCODONE-acetaminophen (PERCOCET) 5-325 MG tablet Take 1 tablet by mouth every 4 (four) hours as needed. 20 tablet 0  . pantoprazole (PROTONIX) 20 MG tablet Take 1 tablet (20 mg total) by mouth daily. (Patient taking differently: Take 20 mg by mouth daily as needed for indigestion. ) 20 tablet 0  . promethazine (PHENERGAN) 25 MG tablet Take 25 mg by mouth every 6 (six) hours as needed for nausea or vomiting.    . vitamin C (ASCORBIC ACID) 500 MG tablet Take 500 mg by mouth daily.    Marland Kitchen zolpidem (AMBIEN) 10 MG tablet Take 10 mg by mouth at bedtime.    Marland Kitchen amLODipine (NORVASC) 5 MG tablet TAKE 1 TABLET (5 MG TOTAL) BY MOUTH DAILY. (Patient taking differently: TAKE 2 TABLET (10 MG TOTAL) BY MOUTH DAILY.) 30 tablet 1  . ciprofloxacin (CIPRO) 500 MG tablet Take 1 tablet (500 mg total) by mouth 2 (two) times daily. (Patient not taking: Reported on 03/29/2017) 14 tablet 0  . metroNIDAZOLE (METROGEL VAGINAL) 0.75 % vaginal gel Place 1 Applicatorful vaginally at bedtime. For 5 nights (Patient not taking: Reported on 03/29/2017) 70 g 0   No facility-administered medications prior to visit.      Allergies:   Bee venom; Doxycycline; Erythromycin; Ibuprofen; Morphine and related; Penicillins; Sulfa antibiotics; Cephalexin; Dust mite extract; Tramadol; Other; and Sulfonamide derivatives   Social History   Social History  . Marital status: Divorced    Spouse name: N/A  . Number of children: N/A  . Years of education: N/A   Social History Main Topics  . Smoking status: Current Every Day Smoker    Packs/day: 0.25    Years: 10.00    Types: Cigarettes  . Smokeless tobacco: Never Used      Comment: smokes 2 a day  . Alcohol use No  . Drug use: No     Comment: Hisrory of Cocaine use   . Sexual activity: No   Other Topics Concern  . None   Social History Narrative   Lives in [place] with daughter and son. She is on disability.      Family History:  The patient's family history includes Cancer in her father; Coronary artery disease in her unknown relative; Heart attack in her mother.   ROS:   Please see the history  of present illness.    ROS All other systems reviewed and are negative.   PHYSICAL EXAM:   VS:  BP 110/72   Pulse 92   Ht 5\' 5"  (1.651 m)   Wt 193 lb 12.8 oz (87.9 kg)   BMI 32.25 kg/m    GEN: Well nourished, well developed, in no acute distress  HEENT: normal  Neck: no JVD, carotid bruits, or masses Cardiac: RRR; no murmurs, rubs, or gallops,no edema  Respiratory:  clear to auscultation bilaterally, normal work of breathing GI: soft, nontender, nondistended, + BS MS: no deformity or atrophy  Skin: warm and dry, no rash Neuro:  Alert and Oriented x 3, Strength and sensation are intact Psych: euthymic mood, full affect  Wt Readings from Last 3 Encounters:  04/22/17 193 lb 12.8 oz (87.9 kg)  04/16/17 193 lb (87.5 kg)  03/29/17 198 lb (89.8 kg)      Studies/Labs Reviewed:   EKG:  EKG is not ordered today.   Recent Labs: 05/11/2016: TSH 0.352 05/14/2016: Magnesium 1.5 09/05/2016: ALT 15 03/29/2017: B Natriuretic Peptide 35.0; BUN 14; Creatinine, Ser 1.25; Hemoglobin 14.4; Platelets 369; Potassium 3.6; Sodium 136   Lipid Panel    Component Value Date/Time   CHOL 183 01/01/2009 1303   TRIG 112.0 01/01/2009 1303   HDL 31.20 (L) 01/01/2009 1303   CHOLHDL 6 01/01/2009 1303   VLDL 22.4 01/01/2009 1303   LDLCALC 129 (H) 01/01/2009 1303    Additional studies/ records that were reviewed today include:   Echo 05/26/2015 LV EF: 55% -   60%  Study Conclusions  - Left ventricle: The cavity size was normal. Wall thickness was   normal.  Systolic function was normal. The estimated ejection   fraction was in the range of 55% to 60%. Wall motion was normal;   there were no regional wall motion abnormalities. Doppler   parameters are consistent with abnormal left ventricular   relaxation (grade 1 diastolic dysfunction). - Atrial septum: There was redundancy of the septum, with   borderline criteria for aneurysm.  Impressions:  - Normal LV systolic function; grade 1 diastolic dysfunction;   trace MR.   ASSESSMENT:    1. Coronary artery disease involving native coronary artery of native heart with angina pectoris with documented spasm ([place])   2. Essential hypertension   3. Hyperlipidemia, unspecified hyperlipidemia type      PLAN:  In order of problems listed above:  1. Coronary spasm: Continue amlodipine. She is fairly stable from cardiology perspective.   2. Hypertension: Blood pressure stable on current medication  3. Hyperlipidemia: Continue Lipitor 20 mg daily    Medication Adjustments/Labs and Tests Ordered: Current medicines are reviewed at length with the patient today.  Concerns regarding medicines are outlined above.  Medication changes, Labs and Tests ordered today are listed in the Patient Instructions below. Patient Instructions  Medication Instructions:   No changes to current regimen.  Labwork:   none  Testing/Procedures:  none  Follow-Up:  6-9 months with Dr. Debara Pickett   If you need a refill on your cardiac medications before your next appointment, please call your pharmacy.      Hilbert Corrigan, [place]  04/24/2017 11:24 AM    [address], [place], [place]  [id number] Phone: [phone]; Fax: [phone]

## 2017-04-24 ENCOUNTER — Encounter: Payer: Self-pay | Admitting: Physician Assistant

## 2017-05-06 IMAGING — DX DG HAND COMPLETE 3+V*L*
3 series · 3 of 3 positions shown · non-contrast
Comparison: 02/28/2004

CLINICAL DATA: Dog bite, pain in hand.

EXAM:
LEFT HAND - COMPLETE 3+ VIEW

[x hand obl left (1 of 2)]
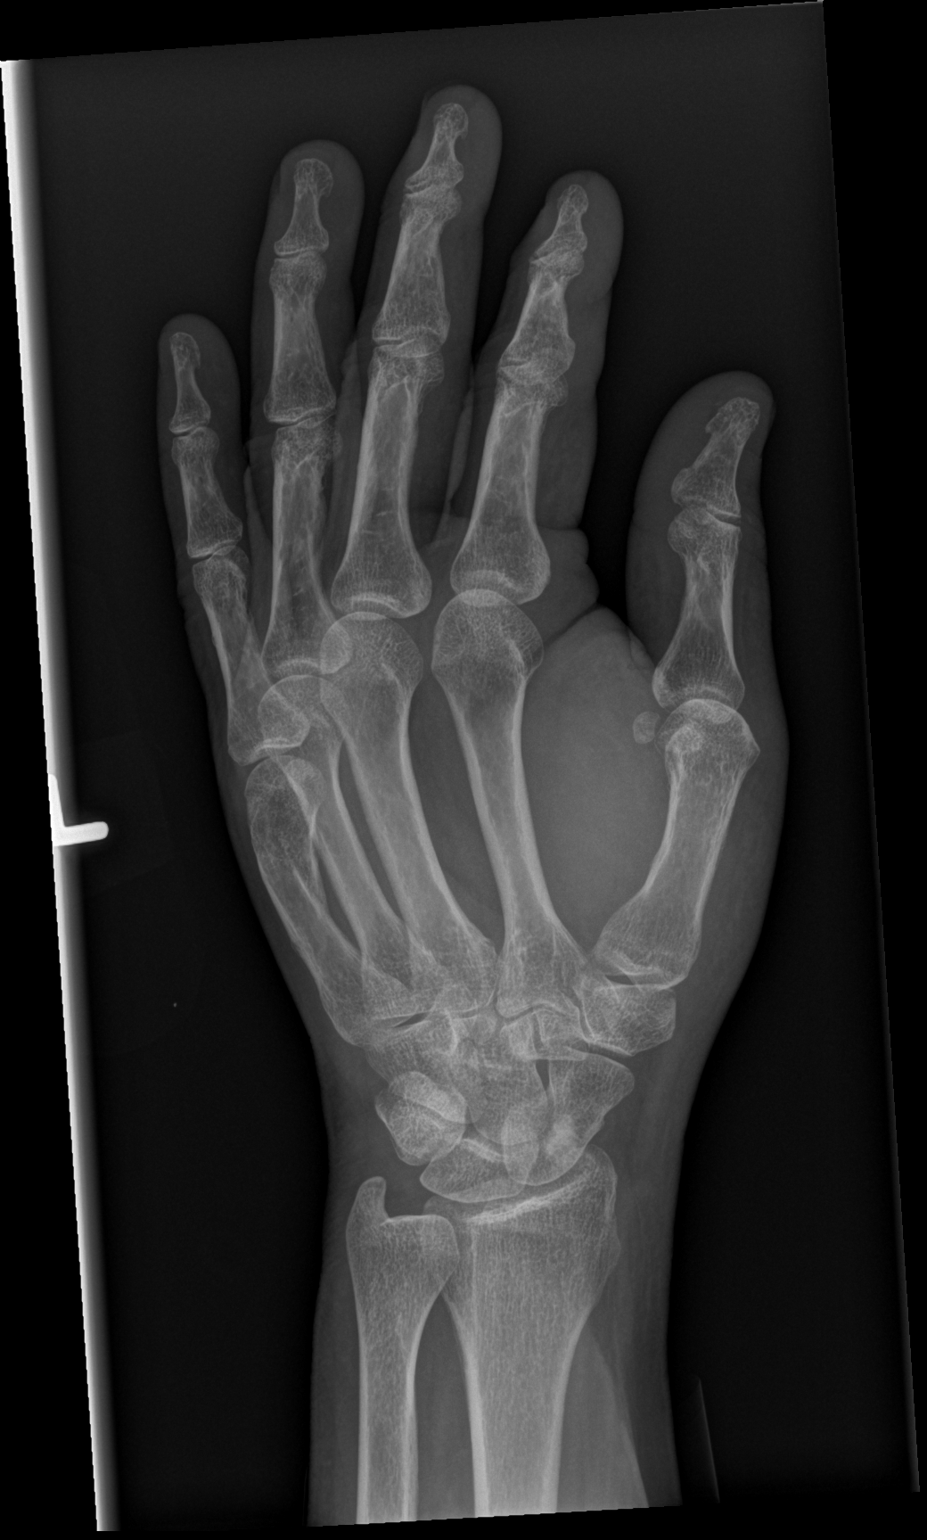

[x hand obl left (2 of 2)]
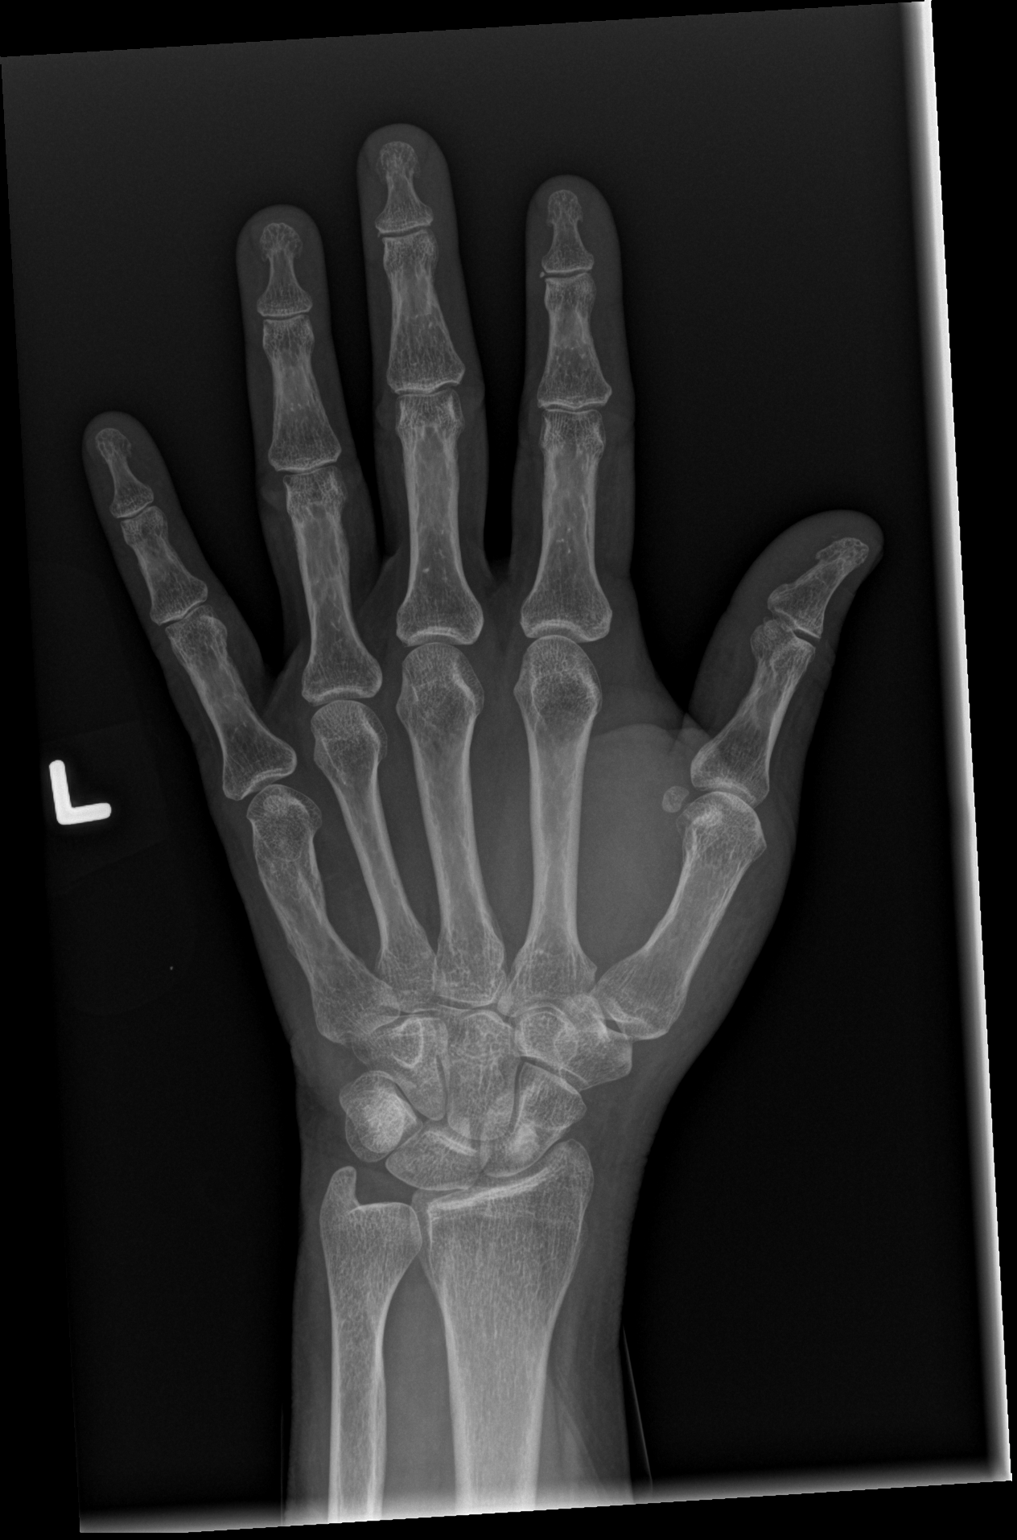

[x hand lat left]
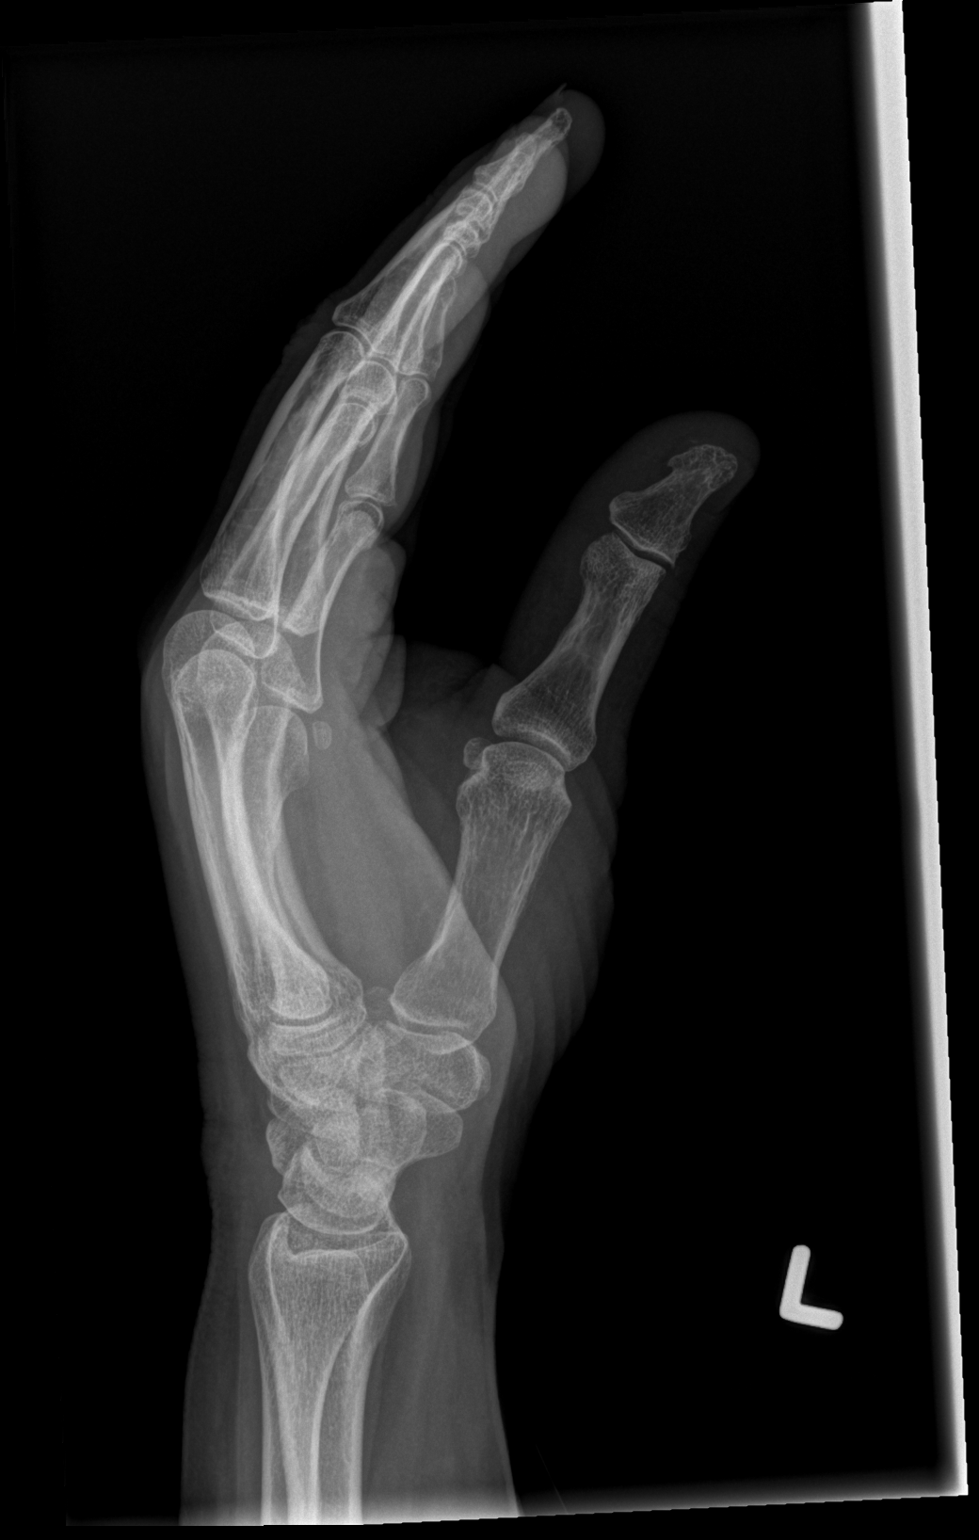

[3 of 3 positions shown; findings below may reference images not displayed]

FINDINGS: Healed deformity from old fifth metacarpal fracture.

Old sclerotic lesion, likely a bone island, in the scaphoid-table.

No new fracture or foreign body is identified.
IMPRESSION: 1. No new fracture or foreign body identified.
2. Healed deformity of the fifth metacarpal from an old fracture.

## 2017-05-17 DIAGNOSIS — E273 Drug-induced adrenocortical insufficiency: Secondary | ICD-10-CM | POA: Diagnosis not present

## 2017-05-17 DIAGNOSIS — I10 Essential (primary) hypertension: Secondary | ICD-10-CM | POA: Diagnosis not present

## 2017-05-17 DIAGNOSIS — R630 Anorexia: Secondary | ICD-10-CM | POA: Diagnosis not present

## 2017-05-17 DIAGNOSIS — E039 Hypothyroidism, unspecified: Secondary | ICD-10-CM | POA: Diagnosis not present

## 2017-05-17 IMAGING — CR DG CHEST 2V
2 series · 2 of 2 positions shown · non-contrast
Comparison: 08/02/2016

CLINICAL DATA: Mid chest pain.  Shortness of breath.

EXAM:
CHEST  2 VIEW

[chest pa]
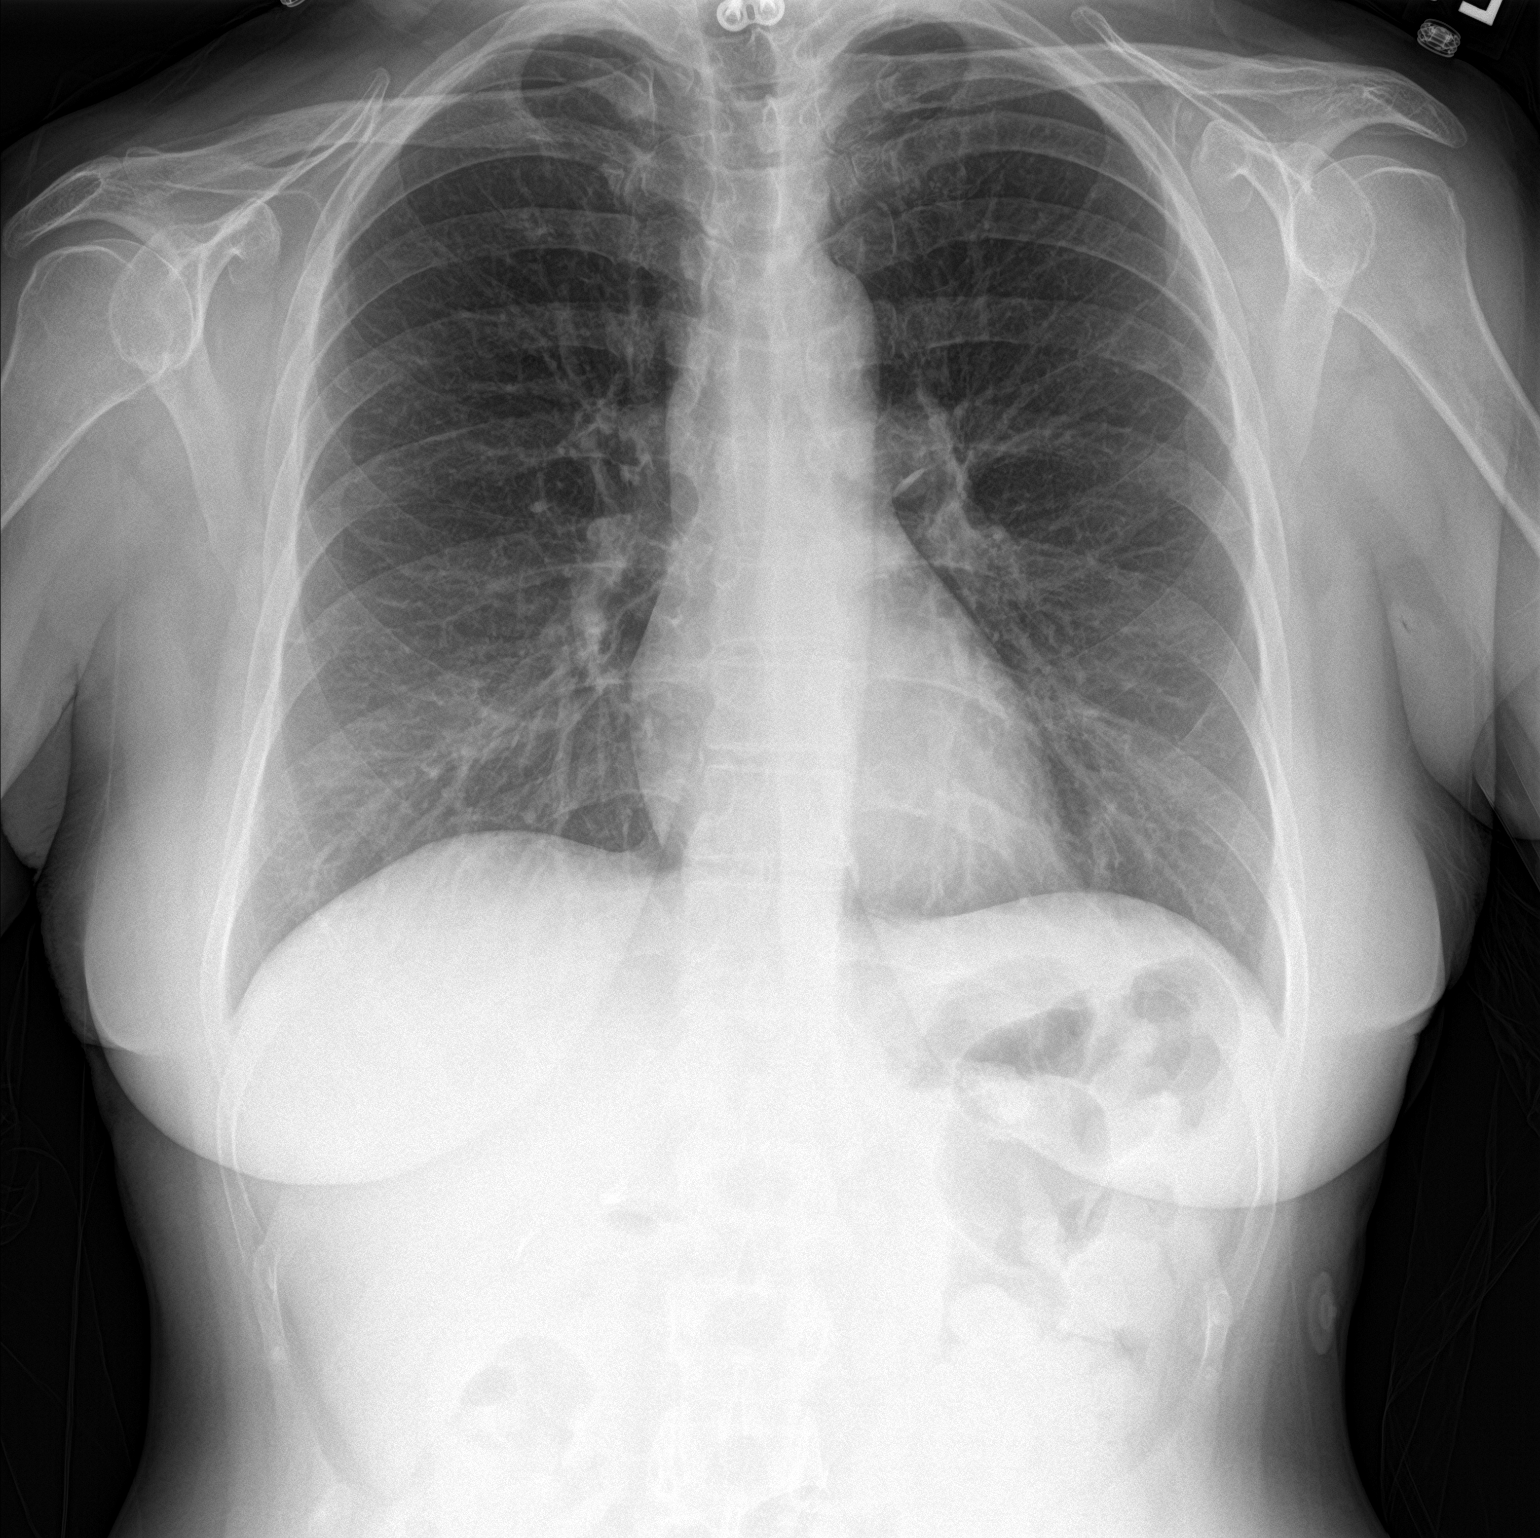

[chest lat]
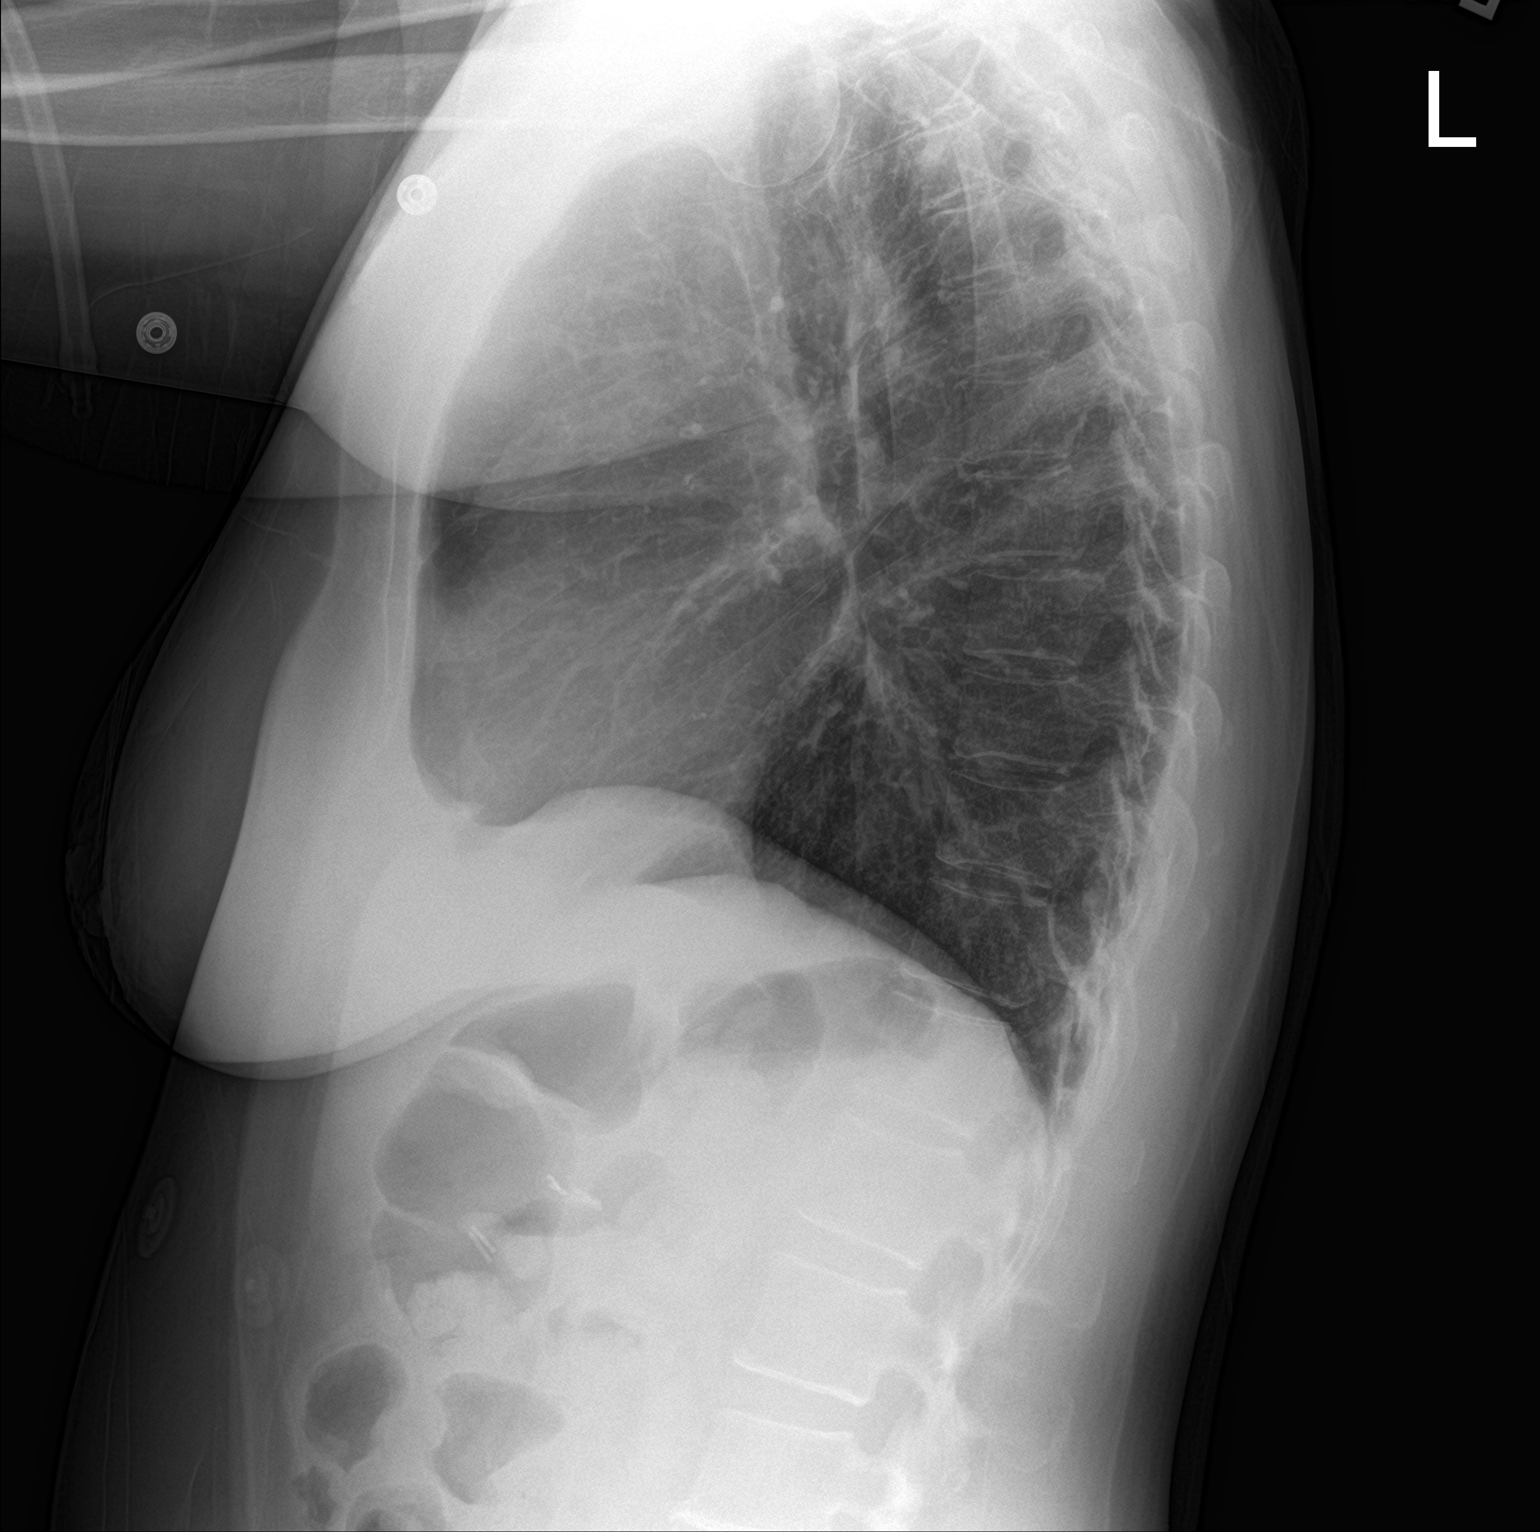

[2 of 2 positions shown; findings below may reference images not displayed]

FINDINGS: The cardiomediastinal contours are normal. The lungs are clear.
Pulmonary vasculature is normal. No consolidation, pleural effusion,
or pneumothorax. No acute osseous abnormalities are seen. Surgical
hardware in the lower cervical spine is partially included.
IMPRESSION: No active cardiopulmonary disease.

## 2017-05-18 DIAGNOSIS — Z6831 Body mass index (BMI) 31.0-31.9, adult: Secondary | ICD-10-CM | POA: Diagnosis not present

## 2017-05-18 DIAGNOSIS — K047 Periapical abscess without sinus: Secondary | ICD-10-CM | POA: Diagnosis not present

## 2017-05-18 DIAGNOSIS — G894 Chronic pain syndrome: Secondary | ICD-10-CM | POA: Diagnosis not present

## 2017-05-18 DIAGNOSIS — Z1389 Encounter for screening for other disorder: Secondary | ICD-10-CM | POA: Diagnosis not present

## 2017-05-18 DIAGNOSIS — E271 Primary adrenocortical insufficiency: Secondary | ICD-10-CM | POA: Diagnosis not present

## 2017-05-18 DIAGNOSIS — I10 Essential (primary) hypertension: Secondary | ICD-10-CM | POA: Diagnosis not present

## 2017-05-18 DIAGNOSIS — E063 Autoimmune thyroiditis: Secondary | ICD-10-CM | POA: Diagnosis not present

## 2017-08-19 DIAGNOSIS — G43009 Migraine without aura, not intractable, without status migrainosus: Secondary | ICD-10-CM | POA: Diagnosis not present

## 2017-08-19 DIAGNOSIS — G4700 Insomnia, unspecified: Secondary | ICD-10-CM | POA: Diagnosis not present

## 2017-08-19 DIAGNOSIS — I10 Essential (primary) hypertension: Secondary | ICD-10-CM | POA: Diagnosis not present

## 2017-08-19 DIAGNOSIS — Z6830 Body mass index (BMI) 30.0-30.9, adult: Secondary | ICD-10-CM | POA: Diagnosis not present

## 2017-08-19 DIAGNOSIS — Z23 Encounter for immunization: Secondary | ICD-10-CM | POA: Diagnosis not present

## 2017-08-19 DIAGNOSIS — I671 Cerebral aneurysm, nonruptured: Secondary | ICD-10-CM | POA: Diagnosis not present

## 2017-08-20 ENCOUNTER — Emergency Department (HOSPITAL_COMMUNITY): Payer: Medicare Other

## 2017-08-20 ENCOUNTER — Emergency Department (HOSPITAL_COMMUNITY)
Admission: EM | Admit: 2017-08-20 | Discharge: 2017-08-20 | Disposition: A | Discharge status: Home / Self Care | Payer: Medicare Other | Attending: Emergency Medicine | Admitting: Emergency Medicine

## 2017-08-20 ENCOUNTER — Encounter (HOSPITAL_COMMUNITY): Payer: Self-pay

## 2017-08-20 DIAGNOSIS — F17210 Nicotine dependence, cigarettes, uncomplicated: Secondary | ICD-10-CM | POA: Insufficient documentation

## 2017-08-20 DIAGNOSIS — R519 Headache, unspecified: Secondary | ICD-10-CM

## 2017-08-20 DIAGNOSIS — R1110 Vomiting, unspecified: Secondary | ICD-10-CM | POA: Insufficient documentation

## 2017-08-20 DIAGNOSIS — Z7982 Long term (current) use of aspirin: Secondary | ICD-10-CM | POA: Insufficient documentation

## 2017-08-20 DIAGNOSIS — E860 Dehydration: Secondary | ICD-10-CM | POA: Insufficient documentation

## 2017-08-20 DIAGNOSIS — R404 Transient alteration of awareness: Secondary | ICD-10-CM | POA: Diagnosis not present

## 2017-08-20 DIAGNOSIS — E039 Hypothyroidism, unspecified: Secondary | ICD-10-CM | POA: Diagnosis not present

## 2017-08-20 DIAGNOSIS — I119 Hypertensive heart disease without heart failure: Secondary | ICD-10-CM | POA: Insufficient documentation

## 2017-08-20 DIAGNOSIS — I2510 Atherosclerotic heart disease of native coronary artery without angina pectoris: Secondary | ICD-10-CM | POA: Diagnosis not present

## 2017-08-20 DIAGNOSIS — R42 Dizziness and giddiness: Secondary | ICD-10-CM | POA: Diagnosis not present

## 2017-08-20 DIAGNOSIS — Z79899 Other long term (current) drug therapy: Secondary | ICD-10-CM | POA: Diagnosis not present

## 2017-08-20 DIAGNOSIS — N289 Disorder of kidney and ureter, unspecified: Secondary | ICD-10-CM | POA: Diagnosis not present

## 2017-08-20 DIAGNOSIS — R51 Headache: Secondary | ICD-10-CM | POA: Diagnosis not present

## 2017-08-20 DIAGNOSIS — R197 Diarrhea, unspecified: Secondary | ICD-10-CM | POA: Diagnosis not present

## 2017-08-20 DIAGNOSIS — R112 Nausea with vomiting, unspecified: Secondary | ICD-10-CM | POA: Diagnosis not present

## 2017-08-20 LAB — COMPREHENSIVE METABOLIC PANEL
ALBUMIN: 4.7 g/dL (ref 3.5–5.0)
ALK PHOS: 144 U/L — AB (ref 38–126)
ALT: 30 U/L (ref 14–54)
AST: 34 U/L (ref 15–41)
Anion gap: 13 (ref 5–15)
BILIRUBIN TOTAL: 0.4 mg/dL (ref 0.3–1.2)
BUN: 13 mg/dL (ref 6–20)
CALCIUM: 9.9 mg/dL (ref 8.9–10.3)
CO2: 28 mmol/L (ref 22–32)
CREATININE: 1.48 mg/dL — AB (ref 0.44–1.00)
Chloride: 95 mmol/L — ABNORMAL LOW (ref 101–111)
GFR calc Af Amer: 46 mL/min — ABNORMAL LOW (ref >60)
GFR, EST NON AFRICAN AMERICAN: 39 mL/min — AB (ref >60)
GLUCOSE: 134 mg/dL — AB (ref 65–99)
Potassium: 4.8 mmol/L (ref 3.5–5.1)
Sodium: 136 mmol/L (ref 135–145)
TOTAL PROTEIN: 8.9 g/dL — AB (ref 6.5–8.1)

## 2017-08-20 LAB — CBC
HCT: 49 % — ABNORMAL HIGH (ref 36.0–46.0)
Hemoglobin: 17 g/dL — ABNORMAL HIGH (ref 12.0–15.0)
MCH: 28.8 pg (ref 26.0–34.0)
MCHC: 34.7 g/dL (ref 30.0–36.0)
MCV: 83.1 fL (ref 78.0–100.0)
PLATELETS: 484 10*3/uL — AB (ref 150–400)
RBC: 5.9 MIL/uL — ABNORMAL HIGH (ref 3.87–5.11)
RDW: 14.4 % (ref 11.5–15.5)
WBC: 21.3 10*3/uL — AB (ref 4.0–10.5)

## 2017-08-20 LAB — LIPASE, BLOOD: Lipase: 26 U/L (ref 11–51)

## 2017-08-20 MED ORDER — HYDROMORPHONE HCL 1 MG/ML IJ SOLN
1.0000 mg | Freq: Once | INTRAMUSCULAR | Status: AC
  Administered 2017-08-20: 1 mg via INTRAVENOUS
  Filled 2017-08-20: qty 1

## 2017-08-20 MED ORDER — SODIUM CHLORIDE 0.9 % IV BOLUS (SEPSIS)
2000.0000 mL | Freq: Once | INTRAVENOUS | Status: AC
  Administered 2017-08-20: 2000 mL via INTRAVENOUS

## 2017-08-20 MED ORDER — HYDROCORTISONE NA SUCCINATE PF 100 MG IJ SOLR
100.0000 mg | Freq: Once | INTRAMUSCULAR | Status: AC
  Administered 2017-08-20: 100 mg via INTRAVENOUS
  Filled 2017-08-20: qty 2

## 2017-08-20 MED ORDER — METOCLOPRAMIDE HCL 5 MG/ML IJ SOLN
10.0000 mg | Freq: Once | INTRAMUSCULAR | Status: AC
  Administered 2017-08-20: 10 mg via INTRAVENOUS
  Filled 2017-08-20: qty 2

## 2017-08-20 NOTE — ED Triage Notes (Addendum)
Patient complains of 2 days of headache and vomiting. This afternoon developed body cramping. Called her doctor and he advised her to drink OJ and eat bananas because he thought potassium was low. After ingestion of same began vomiting again. Alert and oriented. Describes cramping as intermittent and none now

## 2017-08-20 NOTE — ED Notes (Addendum)
Pt ambulated to bathroom at 21:13. Mentioned she wanted to see her nurse several times.

## 2017-08-20 NOTE — ED Notes (Signed)
EDP at bedside  

## 2017-08-20 NOTE — Discharge Instructions (Signed)
Make sure that you drink at least six 8 ounce glasses of water  each day in order to stay well-hydrated.  Avoid milk or foods containing milk such as cheese or ice cream while having diarrhea.  Take Reglan as needed for nausea.  You can take Imodium as directed for diarrhea.  Call Dr. Gerarda Fraction to be seen in the office if you continue to feel badly next week or you can return to the emergency department with concern for any reason

## 2017-08-20 NOTE — ED Provider Notes (Signed)
[facility] EMERGENCY DEPARTMENT Provider Note   CSN: [id number] Arrival date & time: 08/20/17  1842     History   Chief Complaint No chief complaint on file.   HPI [name] is a 51 y.o. female.  HPI Presents with right-sided gradual onset headache 2 days ago.  Patient has had vomiting for the past 2 days vomited twice today and had 8 episodes of diarrhea today.  She denies any abdominal pain.  She denies visual changes.  Other associated symptoms include intermittent muscle cramps in her calves.  No fever no other associated symptoms.  Treated with oxycodone and with Zofran, without relief.  Reports she has had similar headaches to this approximately 20 times throughout her life. Past Medical History:  Diagnosis Date  . Addison's disease ([place])   . Adrenal insufficiency ([place])    diagnosed 2012  . Aneurysm ([place])   . Anxiety   . Astigmatism   . CAD (coronary artery disease)    Cath 2008 EF normal. RCA 50-60, Septal 50%. Myoview 3/12: EF 53% normal perfusion  . Cardiac arrest ([place])    2/2 adissonian crisis  . Cardiomyopathy    resolved  . Chest pain    chronicc  . CHF (congestive heart failure) ([place])   . Chronic back pain   . Chronic diarrhea   . Concussion    sept 28th 2014  . Gastroparesis   . HTN (hypertension)   . Hyperlipidemia   . Hypothyroidism   . Mitral valve prolapse   . Nondiabetic gastroparesis   . QT prolongation   . Tobacco abuse    down to 2 cigarettes per day  . Vertigo     Patient Active Problem List   Diagnosis Date Noted  . Myositis 05/11/2016  . Anxiety 05/26/2015  . Tremors of nervous system 05/26/2015  . Neck pain 05/26/2015  . Hot flushes, perimenopausal 07/24/2013  . CAD (coronary artery disease) 04/11/2012  . GERD (gastroesophageal reflux disease) 04/11/2012  . Addison's disease ([place]) 03/03/2012  . QT prolongation 12/15/2010  . Palpitations 01/01/2009  . Hypothyroidism 12/31/2008  . Hyperlipidemia  12/31/2008  . OVERWEIGHT/OBESITY 12/31/2008  . Essential hypertension 12/31/2008    Past Surgical History:  Procedure Laterality Date  . ABDOMINAL HYSTERECTOMY    . CARDIAC CATHETERIZATION  03/2007   showed 60% lesion in the right coronary artery  . CHOLECYSTECTOMY    . LEFT HEART CATHETERIZATION WITH CORONARY ANGIOGRAM N/A 04/13/2012   Procedure: LEFT HEART CATHETERIZATION WITH CORONARY ANGIOGRAM;  Surgeon: Hillary Bow, MD;  Location: [facility] CATH LAB;  Service: Cardiovascular;  Laterality: N/A;  . SPINE SURGERY    . VARICOSE VEIN SURGERY    . VESICOVAGINAL FISTULA CLOSURE W/ TAH      OB History    Gravida Para Term Preterm AB Living   3 2 2  0 1 2   SAB TAB Ectopic Multiple Live Births   1               Home Medications    Prior to Admission medications   Medication Sig Start Date End Date Taking? Authorizing Provider  amLODipine (NORVASC) 10 MG tablet Take 1 tablet by mouth daily. 04/05/17   [provider]  aspirin 81 MG chewable tablet Chew 81 mg by mouth daily.     [provider]  atorvastatin (LIPITOR) 20 MG tablet Take 20 mg by mouth every morning.     [provider]  cholecalciferol (VITAMIN D) 1000 UNITS  tablet Take 1,000 Units by mouth daily.    [provider]  cyclobenzaprine (FLEXERIL) 5 MG tablet Take 2 tablets (10 mg total) by mouth 3 (three) times daily as needed for muscle spasms. 05/15/16   Kalman Shan Ratliff, DO  furosemide (LASIX) 20 MG tablet Take 20 mg by mouth daily as needed for fluid.     [provider]  hydrochlorothiazide (HYDRODIURIL) 12.5 MG tablet Take 6.25 mg by mouth daily as needed (for High Blood Pressure >145/98). Second line medication.    [provider]  hydrocortisone (CORTEF) 10 MG tablet Take 10-20 mg by mouth 2 (two) times daily. Takes 20 mg in the morning and 10 mg at night 04/07/16   [provider]  levothyroxine (SYNTHROID, LEVOTHROID) 75 MCG tablet Take 75 mcg by  mouth daily before breakfast.    [provider]  LORazepam (ATIVAN) 2 MG tablet Take 2 mg by mouth 4 (four) times daily as needed for anxiety. 05/06/16   [provider]  meclizine (ANTIVERT) 25 MG tablet 1 or 2 tabs PO q8h prn dizziness Patient taking differently: Take 25-50 mg by mouth 3 (three) times daily as needed (for vertigo).  04/16/16   Francine Graven, DO  nitroGLYCERIN (NITROSTAT) 0.4 MG SL tablet Place 0.4 mg under the tongue every 5 (five) minutes as needed for chest pain.    [provider]  oxycodone (ROXICODONE) 30 MG immediate release tablet Take 30 mg by mouth every 4 (four) hours as needed for pain.    [provider]  oxyCODONE-acetaminophen (PERCOCET) 5-325 MG tablet Take 1 tablet by mouth every 4 (four) hours as needed. 04/16/17   Larene Pickett, PA-C  pantoprazole (PROTONIX) 20 MG tablet Take 1 tablet (20 mg total) by mouth daily. Patient taking differently: Take 20 mg by mouth daily as needed for indigestion.  02/04/16   Ward, Ozella Almond, PA-C  promethazine (PHENERGAN) 25 MG tablet Take 25 mg by mouth every 6 (six) hours as needed for nausea or vomiting.    [provider]  vitamin C (ASCORBIC ACID) 500 MG tablet Take 500 mg by mouth daily.    [provider]  zolpidem (AMBIEN) 10 MG tablet Take 10 mg by mouth at bedtime. 05/06/16   [provider]    Family History Family History  Problem Relation Age of Onset  . Cancer Father        Colon  . Coronary artery disease Unknown   . Heart attack Mother     Social History Social History   Tobacco Use  . Smoking status: Current Every Day Smoker    Packs/day: 0.25    Years: 10.00    Pack years: 2.50    Types: Cigarettes  . Smokeless tobacco: Never Used  . Tobacco comment: smokes 2 a day  Substance Use Topics  . Alcohol use: No  . Drug use: No    Comment: Hisrory of Cocaine use      Allergies   Bee venom; Doxycycline; Erythromycin; Ibuprofen;  Morphine and related; Penicillins; Sulfa antibiotics; Cephalexin; Dust mite extract; Tramadol; Other; and Sulfonamide derivatives   Review of Systems Review of Systems  Constitutional: Negative.   HENT: Negative.   Respiratory: Negative.   Cardiovascular: Negative.   Gastrointestinal: Positive for diarrhea, nausea and vomiting.  Musculoskeletal: Positive for myalgias.  Skin: Negative.   Neurological: Positive for headaches.  Psychiatric/Behavioral: Negative.   All other systems reviewed and are negative.    Physical Exam Updated Vital Signs  BP (!) 135/91 (BP Location: Left Arm)   Pulse (!) 113   Temp 97.9 F (36.6 C) (Oral)   Resp 18   SpO2 97%   Physical Exam  Constitutional: She is oriented to person, place, and time. She appears well-developed and well-nourished.  HENT:  Head: Normocephalic and atraumatic.  No facial asymmetry mucous membranes dry  Eyes: Conjunctivae are normal. Pupils are equal, round, and reactive to light.  Neck: Neck supple. No tracheal deviation present. No thyromegaly present.  Cardiovascular: Regular rhythm.  No murmur heard. Tachycardic  Pulmonary/Chest: Effort normal and breath sounds normal.  Abdominal: Soft. Bowel sounds are normal. She exhibits no distension. There is no tenderness.  Musculoskeletal: Normal range of motion. She exhibits no edema or tenderness.  Neurological: She is alert and oriented to person, place, and time. No cranial nerve deficit. Coordination normal.  Skin: Skin is warm and dry. No rash noted.  Psychiatric: She has a normal mood and affect.  Nursing note and vitals reviewed.    ED Treatments / Results  Labs (all labs ordered are listed, but only abnormal results are displayed) Labs Reviewed  COMPREHENSIVE METABOLIC PANEL - Abnormal; Notable for the following components:      Result Value   Chloride 95 (*)    Glucose, Bld 134 (*)    Creatinine, Ser 1.48 (*)    Total Protein 8.9 (*)    Alkaline Phosphatase  144 (*)    GFR calc non Af Amer 39 (*)    GFR calc Af Amer 46 (*)    All other components within normal limits  CBC - Abnormal; Notable for the following components:   WBC 21.3 (*)    RBC 5.90 (*)    Hemoglobin 17.0 (*)    HCT 49.0 (*)    Platelets 484 (*)    All other components within normal limits  LIPASE, BLOOD    EKG  EKG Interpretation None       Radiology No results found.  Procedures Procedures (including critical care time)  Medications Ordered in ED Medications  sodium chloride 0.9 % bolus 2,000 mL (not administered)  hydrocortisone sodium succinate (SOLU-CORTEF) 100 MG injection 100 mg (not administered)   Results for orders placed or performed during the hospital encounter of 08/20/17  Lipase, blood  Result Value Ref Range   Lipase 26 11 - 51 U/L  Comprehensive metabolic panel  Result Value Ref Range   Sodium 136 135 - 145 mmol/L   Potassium 4.8 3.5 - 5.1 mmol/L   Chloride 95 (L) 101 - 111 mmol/L   CO2 28 22 - 32 mmol/L   Glucose, Bld 134 (H) 65 - 99 mg/dL   BUN 13 6 - 20 mg/dL   Creatinine, Ser 1.48 (H) 0.44 - 1.00 mg/dL   Calcium 9.9 8.9 - 10.3 mg/dL   Total Protein 8.9 (H) 6.5 - 8.1 g/dL   Albumin 4.7 3.5 - 5.0 g/dL   AST 34 15 - 41 U/L   ALT 30 14 - 54 U/L   Alkaline Phosphatase 144 (H) 38 - 126 U/L   Total Bilirubin 0.4 0.3 - 1.2 mg/dL   GFR calc non Af Amer 39 (L) >60 mL/min   GFR calc Af Amer 46 (L) >60 mL/min   Anion gap 13 5 - 15  CBC  Result Value Ref Range   WBC 21.3 (H) 4.0 - 10.5 K/uL   RBC 5.90 (H) 3.87 - 5.11 MIL/uL   Hemoglobin 17.0 (H) 12.0 - 15.0  g/dL   HCT 49.0 (H) 36.0 - 46.0 %   MCV 83.1 78.0 - 100.0 fL   MCH 28.8 26.0 - 34.0 pg   MCHC 34.7 30.0 - 36.0 g/dL   RDW 14.4 11.5 - 15.5 %   Platelets 484 (H) 150 - 400 K/uL   Ct Head Wo Contrast  Result Date: 08/20/2017 CLINICAL DATA:  Headache EXAM: CT HEAD WITHOUT CONTRAST TECHNIQUE: Contiguous axial images were obtained from the base of the skull through the vertex  without intravenous contrast. COMPARISON:  None. FINDINGS: BRAIN: The ventricles and sulci are normal. No intraparenchymal hemorrhage, mass effect nor midline shift. No acute large vascular territory infarcts. Grey-white matter distinction is maintained. The basal ganglia are unremarkable. No abnormal extra-axial fluid collections. Basal cisterns are not effaced and midline. The brainstem and cerebellar hemispheres are without acute abnormalities. VASCULAR: No hyperdense vessels. Mild atherosclerosis of the carotid siphons. SKULL/SOFT TISSUES: No skull fracture. No significant soft tissue swelling. ORBITS/SINUSES: The included ocular globes and orbital contents are normal.The mastoid air cells are clear. The included paranasal sinuses are well-aerated. OTHER: None. IMPRESSION: Normal head CT. Electronically Signed   By: Ashley Royalty M.D.   On: 08/20/2017 21:05    Initial Impression / Assessment and Plan / ED Course  I have reviewed the triage vital signs and the nursing notes.  Pertinent labs & imaging results that were available during my care of the patient were reviewed by me and considered in my medical decision making (see chart for details).     10:45 PM resting comfortably.  Headache and muscle cramps much improved after treatment with intravenous hydration with normal saline, intravenous Reglan and IV hydromorphone.  She feels ready to go home.  She is able to drink without nausea or vomiting. Patient reports that she has Zofran and Reglan at home as well as oxycodone. Plan encourage oral hydration Renal insufficiency is chronic Final Clinical Impressions(s) / ED Diagnoses  Diagnoses #1 headache #2 nausea vomiting and diarrhea #3 mild dehydration #4 mild renal insufficiency Final diagnoses:  None    ED Discharge Orders    None       Orlie Dakin, MD 08/20/17 2254

## 2017-08-20 NOTE — ED Notes (Signed)
Pt was called to be roomed x3 at 18:58 and no response.

## 2017-09-08 ENCOUNTER — Emergency Department (HOSPITAL_COMMUNITY)
Admission: EM | Admit: 2017-09-08 | Discharge: 2017-09-08 | Disposition: A | Discharge status: Home / Self Care | Payer: Medicare Other | Attending: Emergency Medicine | Admitting: Emergency Medicine

## 2017-09-08 ENCOUNTER — Emergency Department (HOSPITAL_COMMUNITY): Payer: Medicare Other

## 2017-09-08 DIAGNOSIS — Z5321 Procedure and treatment not carried out due to patient leaving prior to being seen by health care provider: Secondary | ICD-10-CM | POA: Diagnosis not present

## 2017-09-08 DIAGNOSIS — R079 Chest pain, unspecified: Secondary | ICD-10-CM | POA: Diagnosis not present

## 2017-09-08 DIAGNOSIS — R0602 Shortness of breath: Secondary | ICD-10-CM | POA: Diagnosis not present

## 2017-09-08 LAB — BASIC METABOLIC PANEL
Anion gap: 8 (ref 5–15)
BUN: 7 mg/dL (ref 6–20)
CALCIUM: 9.5 mg/dL (ref 8.9–10.3)
CO2: 31 mmol/L (ref 22–32)
Chloride: 100 mmol/L — ABNORMAL LOW (ref 101–111)
Creatinine, Ser: 1.01 mg/dL — ABNORMAL HIGH (ref 0.44–1.00)
GLUCOSE: 109 mg/dL — AB (ref 65–99)
POTASSIUM: 3.6 mmol/L (ref 3.5–5.1)
Sodium: 139 mmol/L (ref 135–145)

## 2017-09-08 LAB — CBC
HEMATOCRIT: 44 % (ref 36.0–46.0)
HEMOGLOBIN: 14.7 g/dL (ref 12.0–15.0)
MCH: 28.7 pg (ref 26.0–34.0)
MCHC: 33.4 g/dL (ref 30.0–36.0)
MCV: 85.8 fL (ref 78.0–100.0)
Platelets: 409 10*3/uL — ABNORMAL HIGH (ref 150–400)
RBC: 5.13 MIL/uL — AB (ref 3.87–5.11)
RDW: 14.1 % (ref 11.5–15.5)
WBC: 12.1 10*3/uL — ABNORMAL HIGH (ref 4.0–10.5)

## 2017-09-08 LAB — URINALYSIS, ROUTINE W REFLEX MICROSCOPIC
BACTERIA UA: NONE SEEN
BILIRUBIN URINE: NEGATIVE
Glucose, UA: NEGATIVE mg/dL
HGB URINE DIPSTICK: NEGATIVE
KETONES UR: NEGATIVE mg/dL
NITRITE: NEGATIVE
PROTEIN: NEGATIVE mg/dL
Specific Gravity, Urine: 1.008 (ref 1.005–1.030)
pH: 7 (ref 5.0–8.0)

## 2017-09-08 NOTE — ED Notes (Signed)
Pt states that she has been here all day and wishes to leave. Pt instructed on the risks of leaving with out being seen and verbalized understanding. Pt ambulated out of the emergency department gait steady.

## 2017-09-08 NOTE — ED Triage Notes (Addendum)
Pt reports SOB that started 2 days ago. Pt states that she had a near fall that day and hit her rt chest on the handrail. SHe  has had pain with breathing on her right side. Pt reports dizziness now as well.

## 2017-09-08 NOTE — ED Notes (Signed)
Pt wheeling self to nurse first to ask "how much longer?" states chest pain is worse-- "I think it is my chest wall" pt able to speak in complete sentences.

## 2017-09-09 ENCOUNTER — Emergency Department (HOSPITAL_COMMUNITY)
Admission: EM | Admit: 2017-09-09 | Discharge: 2017-09-09 | Disposition: A | Discharge status: Home / Self Care | Payer: Medicare Other | Attending: Emergency Medicine | Admitting: Emergency Medicine

## 2017-09-09 ENCOUNTER — Encounter (HOSPITAL_COMMUNITY): Payer: Self-pay

## 2017-09-09 ENCOUNTER — Other Ambulatory Visit: Payer: Self-pay

## 2017-09-09 DIAGNOSIS — I2510 Atherosclerotic heart disease of native coronary artery without angina pectoris: Secondary | ICD-10-CM | POA: Diagnosis not present

## 2017-09-09 DIAGNOSIS — E039 Hypothyroidism, unspecified: Secondary | ICD-10-CM | POA: Insufficient documentation

## 2017-09-09 DIAGNOSIS — S20219A Contusion of unspecified front wall of thorax, initial encounter: Secondary | ICD-10-CM | POA: Diagnosis not present

## 2017-09-09 DIAGNOSIS — Y999 Unspecified external cause status: Secondary | ICD-10-CM | POA: Insufficient documentation

## 2017-09-09 DIAGNOSIS — Z79899 Other long term (current) drug therapy: Secondary | ICD-10-CM | POA: Diagnosis not present

## 2017-09-09 DIAGNOSIS — Y939 Activity, unspecified: Secondary | ICD-10-CM | POA: Insufficient documentation

## 2017-09-09 DIAGNOSIS — I509 Heart failure, unspecified: Secondary | ICD-10-CM | POA: Insufficient documentation

## 2017-09-09 DIAGNOSIS — S20211A Contusion of right front wall of thorax, initial encounter: Secondary | ICD-10-CM | POA: Diagnosis not present

## 2017-09-09 DIAGNOSIS — W19XXXA Unspecified fall, initial encounter: Secondary | ICD-10-CM | POA: Insufficient documentation

## 2017-09-09 DIAGNOSIS — Z7982 Long term (current) use of aspirin: Secondary | ICD-10-CM | POA: Insufficient documentation

## 2017-09-09 DIAGNOSIS — F17210 Nicotine dependence, cigarettes, uncomplicated: Secondary | ICD-10-CM | POA: Insufficient documentation

## 2017-09-09 DIAGNOSIS — I110 Hypertensive heart disease with heart failure: Secondary | ICD-10-CM | POA: Diagnosis not present

## 2017-09-09 DIAGNOSIS — Y929 Unspecified place or not applicable: Secondary | ICD-10-CM | POA: Insufficient documentation

## 2017-09-09 DIAGNOSIS — S299XXA Unspecified injury of thorax, initial encounter: Secondary | ICD-10-CM | POA: Diagnosis present

## 2017-09-09 LAB — CBC
HEMATOCRIT: 45 % (ref 36.0–46.0)
Hemoglobin: 14.7 g/dL (ref 12.0–15.0)
MCH: 28.2 pg (ref 26.0–34.0)
MCHC: 32.7 g/dL (ref 30.0–36.0)
MCV: 86.2 fL (ref 78.0–100.0)
PLATELETS: 393 10*3/uL (ref 150–400)
RBC: 5.22 MIL/uL — ABNORMAL HIGH (ref 3.87–5.11)
RDW: 14.3 % (ref 11.5–15.5)
WBC: 9.6 10*3/uL (ref 4.0–10.5)

## 2017-09-09 LAB — BASIC METABOLIC PANEL
ANION GAP: 9 (ref 5–15)
BUN: 6 mg/dL (ref 6–20)
CALCIUM: 9.4 mg/dL (ref 8.9–10.3)
CO2: 30 mmol/L (ref 22–32)
Chloride: 99 mmol/L — ABNORMAL LOW (ref 101–111)
Creatinine, Ser: 1.02 mg/dL — ABNORMAL HIGH (ref 0.44–1.00)
GFR calc Af Amer: >60 mL/min (ref >60)
GLUCOSE: 123 mg/dL — AB (ref 65–99)
Potassium: 4.5 mmol/L (ref 3.5–5.1)
Sodium: 138 mmol/L (ref 135–145)

## 2017-09-09 LAB — I-STAT TROPONIN, ED: Troponin i, poc: 0 ng/mL (ref 0.00–0.08)

## 2017-09-09 MED ORDER — CYCLOBENZAPRINE HCL 5 MG PO TABS: 5.0000 mg | ORAL_TABLET | Freq: Three times a day (TID) | ORAL | 0 refills | Status: AC | PRN

## 2017-09-09 MED ORDER — CYCLOBENZAPRINE HCL 10 MG PO TABS
5.0000 mg | ORAL_TABLET | Freq: Once | ORAL | Status: AC
  Administered 2017-09-09: 5 mg via ORAL
  Filled 2017-09-09: qty 1

## 2017-09-09 NOTE — ED Provider Notes (Signed)
[facility] EMERGENCY DEPARTMENT Provider Note   CSN: [id number] Arrival date & time: 09/09/17  1323     History   Chief Complaint Chief Complaint  Patient presents with  . Chest Pain    HPI [name] is a 52 y.o. female.  The history is provided by the patient and a relative.  Fall  This is a new problem. The current episode started more than 2 days ago. The problem occurs constantly. The problem has been gradually worsening. Associated symptoms include chest pain (right ribs). Pertinent negatives include no headaches and no shortness of breath. The symptoms are aggravated by bending, twisting and coughing. The symptoms are relieved by narcotics. Treatments tried: narcotics. The treatment provided significant relief.    Past Medical History:  Diagnosis Date  . Addison's disease ([place])   . Adrenal insufficiency ([place])    diagnosed 2012  . Aneurysm ([place])   . Anxiety   . Astigmatism   . CAD (coronary artery disease)    Cath 2008 EF normal. RCA 50-60, Septal 50%. Myoview 3/12: EF 53% normal perfusion  . Cardiac arrest ([place])    2/2 adissonian crisis  . Cardiomyopathy    resolved  . Chest pain    chronicc  . CHF (congestive heart failure) ([place])   . Chronic back pain   . Chronic diarrhea   . Concussion    sept 28th 2014  . Gastroparesis   . HTN (hypertension)   . Hyperlipidemia   . Hypothyroidism   . Mitral valve prolapse   . Nondiabetic gastroparesis   . QT prolongation   . Tobacco abuse    down to 2 cigarettes per day  . Vertigo     Patient Active Problem List   Diagnosis Date Noted  . Myositis 05/11/2016  . Anxiety 05/26/2015  . Tremors of nervous system 05/26/2015  . Neck pain 05/26/2015  . Hot flushes, perimenopausal 07/24/2013  . CAD (coronary artery disease) 04/11/2012  . GERD (gastroesophageal reflux disease) 04/11/2012  . Addison's disease ([place]) 03/03/2012  . QT prolongation 12/15/2010  . Palpitations 01/01/2009  .  Hypothyroidism 12/31/2008  . Hyperlipidemia 12/31/2008  . OVERWEIGHT/OBESITY 12/31/2008  . Essential hypertension 12/31/2008    Past Surgical History:  Procedure Laterality Date  . ABDOMINAL HYSTERECTOMY    . CARDIAC CATHETERIZATION  03/2007   showed 60% lesion in the right coronary artery  . CHOLECYSTECTOMY    . LEFT HEART CATHETERIZATION WITH CORONARY ANGIOGRAM N/A 04/13/2012   Procedure: LEFT HEART CATHETERIZATION WITH CORONARY ANGIOGRAM;  Surgeon: Hillary Bow, MD;  Location: [facility] CATH LAB;  Service: Cardiovascular;  Laterality: N/A;  . SPINE SURGERY    . VARICOSE VEIN SURGERY    . VESICOVAGINAL FISTULA CLOSURE W/ TAH      OB History    Gravida Para Term Preterm AB Living   3 2 2  0 1 2   SAB TAB Ectopic Multiple Live Births   1               Home Medications    Prior to Admission medications   Medication Sig Start Date End Date Taking? Authorizing Provider  amLODipine (NORVASC) 10 MG tablet Take 1 tablet by mouth daily. 04/05/17   [provider]  aspirin 81 MG chewable tablet Chew 81 mg by mouth daily.     [provider]  atorvastatin (LIPITOR) 20 MG tablet Take 20 mg by mouth every morning.     [provider]  cholecalciferol (VITAMIN  D) 1000 UNITS tablet Take 1,000 Units by mouth daily.    [provider]  cyclobenzaprine (FLEXERIL) 5 MG tablet Take 1 tablet (5 mg total) by mouth 3 (three) times daily as needed for up to 7 days for muscle spasms. 09/09/17 09/16/17  Jenny Reichmann, MD  furosemide (LASIX) 20 MG tablet Take 20 mg by mouth daily as needed for fluid.     [provider]  hydrochlorothiazide (HYDRODIURIL) 12.5 MG tablet Take 6.25 mg by mouth daily as needed (for High Blood Pressure >145/98). Second line medication.    [provider]  hydrocortisone (CORTEF) 10 MG tablet Take 10-20 mg by mouth 2 (two) times daily. Takes 20 mg in the morning and 10 mg at night 04/07/16   [provider]  levothyroxine  (SYNTHROID, LEVOTHROID) 75 MCG tablet Take 75 mcg by mouth daily before breakfast.    [provider]  LORazepam (ATIVAN) 2 MG tablet Take 2 mg by mouth every 8 (eight) hours as needed for anxiety.  05/06/16   [provider]  meclizine (ANTIVERT) 25 MG tablet 1 or 2 tabs PO q8h prn dizziness Patient taking differently: Take 25-50 mg by mouth 3 (three) times daily as needed (for vertigo).  04/16/16   Francine Graven, DO  metoCLOPramide (REGLAN) 10 MG tablet Take 10 mg by mouth 4 (four) times daily - after meals and at bedtime. 07/11/17   [provider]  nitroGLYCERIN (NITROSTAT) 0.4 MG SL tablet Place 0.4 mg under the tongue every 5 (five) minutes as needed for chest pain.    [provider]  oxycodone (ROXICODONE) 30 MG immediate release tablet Take 30 mg by mouth every 4 (four) hours as needed for pain.    [provider]  oxyCODONE-acetaminophen (PERCOCET) 5-325 MG tablet Take 1 tablet by mouth every 4 (four) hours as needed. Patient not taking: Reported on 09/08/2017 04/16/17   Larene Pickett, PA-C  pantoprazole (PROTONIX) 20 MG tablet Take 1 tablet (20 mg total) by mouth daily. Patient taking differently: Take 20 mg by mouth daily as needed for indigestion.  02/04/16   Ward, Ozella Almond, PA-C  vitamin C (ASCORBIC ACID) 500 MG tablet Take 500 mg by mouth daily.    [provider]  zolpidem (AMBIEN) 10 MG tablet Take 10 mg by mouth at bedtime. 05/06/16   [provider]    Family History Family History  Problem Relation Age of Onset  . Cancer Father        Colon  . Coronary artery disease Unknown   . Heart attack Mother     Social History Social History   Tobacco Use  . Smoking status: Current Every Day Smoker    Packs/day: 0.25    Years: 10.00    Pack years: 2.50    Types: Cigarettes  . Smokeless tobacco: Never Used  . Tobacco comment: smokes 2 a day  Substance Use Topics  . Alcohol use: No  . Drug use: No     Comment: Hisrory of Cocaine use      Allergies   Bee venom; Doxycycline; Erythromycin; Ibuprofen; Morphine and related; Penicillins; Sulfa antibiotics; Cephalexin; Dust mite extract; Tramadol; Other; and Sulfonamide derivatives   Review of Systems Review of Systems  Constitutional: Negative for chills and fever.  HENT: Negative for rhinorrhea and sore throat.   Eyes: Negative for visual disturbance.  Respiratory: Negative for cough and shortness of breath.   Cardiovascular: Positive for chest pain (right ribs). Negative for palpitations.  Gastrointestinal: Negative for nausea and vomiting.  Genitourinary: Negative for dysuria.  Musculoskeletal: Negative for arthralgias, back pain and neck pain.  Skin: Negative for wound.  Neurological: Negative for syncope, weakness and headaches.  Psychiatric/Behavioral: Negative for confusion.  All other systems reviewed and are negative.    Physical Exam Updated Vital Signs BP 108/74   Pulse 73   Temp 98.3 F (36.8 C) (Oral)   Resp 18   SpO2 95%   Physical Exam  Constitutional: She appears well-developed and well-nourished. No distress.  HENT:  Head: Normocephalic and atraumatic.  Eyes: Conjunctivae are normal.  Neck: Neck supple.  Cardiovascular: Normal rate and regular rhythm.  No murmur heard. Pulmonary/Chest: Effort normal and breath sounds normal. No respiratory distress.  Abdominal: Soft. There is no tenderness.  Musculoskeletal: She exhibits tenderness. She exhibits no edema.  TTP over right upper and lateral ribs w/o overlying evidence of trauma  Neurological: She is alert.  Skin: Skin is warm and dry.  Psychiatric: She has a normal mood and affect.  Nursing note and vitals reviewed.    ED Treatments / Results  Labs (all labs ordered are listed, but only abnormal results are displayed) Labs Reviewed  CBC - Abnormal; Notable for the following components:      Result Value   RBC 5.22 (*)    All other components  within normal limits  BASIC METABOLIC PANEL - Abnormal; Notable for the following components:   Chloride 99 (*)    Glucose, Bld 123 (*)    Creatinine, Ser 1.02 (*)    All other components within normal limits  I-STAT TROPONIN, ED    EKG  EKG Interpretation None       Radiology Dg Chest 2 View  Result Date: 09/08/2017 CLINICAL DATA:  Chest pain right-sided.  Fall EXAM: CHEST  2 VIEW COMPARISON:  04/16/2017 FINDINGS: The heart size and mediastinal contours are within normal limits. Both lungs are clear. The visualized skeletal structures are unremarkable. IMPRESSION: No active cardiopulmonary disease. Electronically Signed   By: Franchot Gallo M.D.   On: 09/08/2017 12:53    Procedures Procedures (including critical care time)  Medications Ordered in ED Medications  cyclobenzaprine (FLEXERIL) tablet 5 mg (5 mg Oral Given 09/09/17 2006)     Initial Impression / Assessment and Plan / ED Course  I have reviewed the triage vital signs and the nursing notes.  Pertinent labs & imaging results that were available during my care of the patient were reviewed by me and considered in my medical decision making (see chart for details).     Pt presents with right chest pain after a fall. Fell on Tuesday while walking down the steps and landed on her right side; came in for evaluation previously, but left b/c she was waiting too long to be seen. Since then, her pain has increased, so she came back for evaluation. Pt takes oxycodone at home for other pain which she says has been helping, but when the meds wear off her pain comes back. Denies F/C, HA, lightheadedness, SOB, N/V/D.  VS & exam as above. CXR from 1/3 reviewed by me & shows no evidence of fracture or PTX. Pt likely suffering from bruised ribs.  Explained all results to the Pt. Will discharge the Pt home with rx for Flexeril. Recommending follow-up with PCP. ED return precautions provided. Pt acknowledged understanding of, and  concurrence with the plan. All questions answered to her satisfaction. In stable condition at the time of discharge.  Final Clinical Impressions(s) / ED Diagnoses   Final diagnoses:  Contusion of rib on right side, initial encounter    ED Discharge Orders        Ordered    cyclobenzaprine (FLEXERIL) 5 MG tablet  3 times daily PRN     09/09/17 2021       Jenny Reichmann, MD 09/09/17 2023    Tanna Furry, MD 09/10/17 2236

## 2017-09-09 NOTE — ED Provider Notes (Signed)
Patient placed in Quick Look pathway, seen and evaluated for chief complaint of chest pain Tuesday. Appears mechanical carrying daughter and missing step. Left yesterday after labs and imaging.  Pertinent H&P findings include nausea, numbness. Chest pain palpable. Minimal pain at rest and without movement.  Based on initial evaluation, labs are indicated and radiology studies not indicated as CXR yesterday.  Patient counseled on process, plan, and necessity for staying for completing the evaluation.    Shary Decamp, PA-C 09/09/17 1349    Pattricia Boss, MD 09/09/17 [phone]

## 2017-09-09 NOTE — ED Triage Notes (Signed)
Pt reports she had a fall last week. She states she has had right breast pain, right side chest pain especially the rib area. She reports some nausea as well.

## 2017-10-17 ENCOUNTER — Encounter (HOSPITAL_COMMUNITY): Payer: Self-pay | Admitting: Emergency Medicine

## 2017-10-17 ENCOUNTER — Emergency Department (HOSPITAL_COMMUNITY)
Admission: EM | Admit: 2017-10-17 | Discharge: 2017-10-17 | Disposition: A | Discharge status: Home / Self Care | Payer: Medicare Other | Attending: Emergency Medicine | Admitting: Emergency Medicine

## 2017-10-17 DIAGNOSIS — Z7982 Long term (current) use of aspirin: Secondary | ICD-10-CM | POA: Diagnosis not present

## 2017-10-17 DIAGNOSIS — R112 Nausea with vomiting, unspecified: Secondary | ICD-10-CM | POA: Insufficient documentation

## 2017-10-17 DIAGNOSIS — R1013 Epigastric pain: Secondary | ICD-10-CM | POA: Diagnosis not present

## 2017-10-17 DIAGNOSIS — K2970 Gastritis, unspecified, without bleeding: Secondary | ICD-10-CM | POA: Diagnosis not present

## 2017-10-17 DIAGNOSIS — E039 Hypothyroidism, unspecified: Secondary | ICD-10-CM | POA: Insufficient documentation

## 2017-10-17 DIAGNOSIS — R110 Nausea: Secondary | ICD-10-CM | POA: Diagnosis not present

## 2017-10-17 DIAGNOSIS — K3184 Gastroparesis: Secondary | ICD-10-CM | POA: Diagnosis not present

## 2017-10-17 DIAGNOSIS — R10812 Left upper quadrant abdominal tenderness: Secondary | ICD-10-CM | POA: Diagnosis not present

## 2017-10-17 DIAGNOSIS — I110 Hypertensive heart disease with heart failure: Secondary | ICD-10-CM | POA: Diagnosis not present

## 2017-10-17 DIAGNOSIS — I2510 Atherosclerotic heart disease of native coronary artery without angina pectoris: Secondary | ICD-10-CM | POA: Insufficient documentation

## 2017-10-17 DIAGNOSIS — Z79899 Other long term (current) drug therapy: Secondary | ICD-10-CM | POA: Insufficient documentation

## 2017-10-17 DIAGNOSIS — K3189 Other diseases of stomach and duodenum: Secondary | ICD-10-CM | POA: Diagnosis not present

## 2017-10-17 DIAGNOSIS — I509 Heart failure, unspecified: Secondary | ICD-10-CM | POA: Insufficient documentation

## 2017-10-17 DIAGNOSIS — F17210 Nicotine dependence, cigarettes, uncomplicated: Secondary | ICD-10-CM | POA: Insufficient documentation

## 2017-10-17 LAB — LIPASE, BLOOD: LIPASE: 41 U/L (ref 11–51)

## 2017-10-17 LAB — COMPREHENSIVE METABOLIC PANEL
ALK PHOS: 106 U/L (ref 38–126)
ALT: 13 U/L — AB (ref 14–54)
AST: 18 U/L (ref 15–41)
Albumin: 4.3 g/dL (ref 3.5–5.0)
Anion gap: 13 (ref 5–15)
BILIRUBIN TOTAL: 0.5 mg/dL (ref 0.3–1.2)
BUN: 12 mg/dL (ref 6–20)
CALCIUM: 9.1 mg/dL (ref 8.9–10.3)
CO2: 22 mmol/L (ref 22–32)
CREATININE: 0.94 mg/dL (ref 0.44–1.00)
Chloride: 103 mmol/L (ref 101–111)
GFR calc Af Amer: >60 mL/min (ref >60)
Glucose, Bld: 123 mg/dL — ABNORMAL HIGH (ref 65–99)
Potassium: 3.4 mmol/L — ABNORMAL LOW (ref 3.5–5.1)
Sodium: 138 mmol/L (ref 135–145)
TOTAL PROTEIN: 8.1 g/dL (ref 6.5–8.1)

## 2017-10-17 LAB — CBC WITH DIFFERENTIAL/PLATELET
Basophils Absolute: 0.1 10*3/uL (ref 0.0–0.1)
Basophils Relative: 1 %
Eosinophils Absolute: 0.2 10*3/uL (ref 0.0–0.7)
Eosinophils Relative: 1 %
HCT: 45.9 % (ref 36.0–46.0)
Hemoglobin: 15.7 g/dL — ABNORMAL HIGH (ref 12.0–15.0)
Lymphocytes Relative: 26 %
Lymphs Abs: 3.9 10*3/uL (ref 0.7–4.0)
MCH: 28.4 pg (ref 26.0–34.0)
MCHC: 34.2 g/dL (ref 30.0–36.0)
MCV: 83.2 fL (ref 78.0–100.0)
Monocytes Absolute: 0.8 10*3/uL (ref 0.1–1.0)
Monocytes Relative: 5 %
Neutro Abs: 10.3 10*3/uL — ABNORMAL HIGH (ref 1.7–7.7)
Neutrophils Relative %: 67 %
Platelets: 415 10*3/uL — ABNORMAL HIGH (ref 150–400)
RBC: 5.52 MIL/uL — ABNORMAL HIGH (ref 3.87–5.11)
RDW: 13.7 % (ref 11.5–15.5)
WBC: 15.3 10*3/uL — ABNORMAL HIGH (ref 4.0–10.5)

## 2017-10-17 LAB — TROPONIN I: Troponin I: <0.03 ng/mL (ref <0.03)

## 2017-10-17 MED ORDER — ONDANSETRON HCL 4 MG/2ML IJ SOLN
INTRAMUSCULAR | Status: AC
  Filled 2017-10-17: qty 2

## 2017-10-17 MED ORDER — HYDROMORPHONE HCL 1 MG/ML IJ SOLN
1.0000 mg | Freq: Once | INTRAMUSCULAR | Status: AC
  Administered 2017-10-17: 1 mg via INTRAVENOUS
  Filled 2017-10-17: qty 1

## 2017-10-17 MED ORDER — ONDANSETRON HCL 4 MG/2ML IJ SOLN
4.0000 mg | Freq: Once | INTRAMUSCULAR | Status: AC
  Administered 2017-10-17: 4 mg via INTRAVENOUS

## 2017-10-17 MED ORDER — SODIUM CHLORIDE 0.9 % IV BOLUS (SEPSIS)
1000.0000 mL | Freq: Once | INTRAVENOUS | Status: AC
  Administered 2017-10-17: 1000 mL via INTRAVENOUS

## 2017-10-17 MED ORDER — PROMETHAZINE HCL 25 MG/ML IJ SOLN
12.5000 mg | Freq: Once | INTRAMUSCULAR | Status: AC
  Administered 2017-10-17: 12.5 mg via INTRAVENOUS
  Filled 2017-10-17: qty 1

## 2017-10-17 MED ORDER — ONDANSETRON HCL 4 MG/2ML IJ SOLN
4.0000 mg | Freq: Once | INTRAMUSCULAR | Status: AC
  Administered 2017-10-17: 4 mg via INTRAVENOUS
  Filled 2017-10-17: qty 2

## 2017-10-17 MED ORDER — PANTOPRAZOLE SODIUM 40 MG IV SOLR
40.0000 mg | Freq: Once | INTRAVENOUS | Status: AC
  Administered 2017-10-17: 40 mg via INTRAVENOUS
  Filled 2017-10-17: qty 40

## 2017-10-17 MED ORDER — METOCLOPRAMIDE HCL 5 MG/ML IJ SOLN
INTRAMUSCULAR | Status: AC
  Filled 2017-10-17: qty 2

## 2017-10-17 MED ORDER — METOCLOPRAMIDE HCL 5 MG/ML IJ SOLN
10.0000 mg | Freq: Once | INTRAMUSCULAR | Status: AC
  Administered 2017-10-17: 10 mg via INTRAVENOUS
  Filled 2017-10-17: qty 2

## 2017-10-17 NOTE — Discharge Instructions (Signed)
Continue your medications as previously prescribed. ° °Return to the emergency department if symptoms significantly worsen or change. °

## 2017-10-17 NOTE — ED Notes (Signed)
Pt had one episode of vomiting. Moderate amount noted. Bedside cleaning done and RN aware.

## 2017-10-17 NOTE — ED Triage Notes (Signed)
Pt brought in by [facility] for c/o left upper abdominal pain x 12 hours with n/v; pt states the pain woke her up

## 2017-10-17 NOTE — ED Provider Notes (Signed)
[facility] EMERGENCY DEPARTMENT Provider Note   CSN: [id number] Arrival date & time: 10/17/17  3474     History   Chief Complaint Chief Complaint  Patient presents with  . Abdominal Pain    HPI [name] is a 52 y.o. female.  Patient is a 52 year old female with past medical history of gastroparesis, coronary artery disease, CHF presenting for evaluation of epigastric pain and vomiting.  She states that this is similar to her episodes of gastroparesis.  She has had no relief with her home medications.  She denies any diarrhea.  She denies any bloody stool or vomit.   The history is provided by the patient.  Abdominal Pain   This is a recurrent problem. The current episode started 3 to 5 hours ago. The problem occurs constantly. The problem has been rapidly worsening. The pain is associated with eating. The pain is located in the epigastric region. The quality of the pain is cramping. The pain is severe. Associated symptoms include nausea and vomiting. Pertinent negatives include fever. Nothing aggravates the symptoms. Nothing relieves the symptoms.    Past Medical History:  Diagnosis Date  . Addison's disease ([place])   . Adrenal insufficiency ([place])    diagnosed 2012  . Aneurysm ([place])   . Anxiety   . Astigmatism   . CAD (coronary artery disease)    Cath 2008 EF normal. RCA 50-60, Septal 50%. Myoview 3/12: EF 53% normal perfusion  . Cardiac arrest ([place])    2/2 adissonian crisis  . Cardiomyopathy    resolved  . Chest pain    chronicc  . CHF (congestive heart failure) ([place])   . Chronic back pain   . Chronic diarrhea   . Concussion    sept 28th 2014  . Gastroparesis   . HTN (hypertension)   . Hyperlipidemia   . Hypothyroidism   . Mitral valve prolapse   . Nondiabetic gastroparesis   . QT prolongation   . Tobacco abuse    down to 2 cigarettes per day  . Vertigo     Patient Active Problem List   Diagnosis Date Noted  . Myositis 05/11/2016  . Anxiety  05/26/2015  . Tremors of nervous system 05/26/2015  . Neck pain 05/26/2015  . Hot flushes, perimenopausal 07/24/2013  . CAD (coronary artery disease) 04/11/2012  . GERD (gastroesophageal reflux disease) 04/11/2012  . Addison's disease ([place]) 03/03/2012  . QT prolongation 12/15/2010  . Palpitations 01/01/2009  . Hypothyroidism 12/31/2008  . Hyperlipidemia 12/31/2008  . OVERWEIGHT/OBESITY 12/31/2008  . Essential hypertension 12/31/2008    Past Surgical History:  Procedure Laterality Date  . ABDOMINAL HYSTERECTOMY    . CARDIAC CATHETERIZATION  03/2007   showed 60% lesion in the right coronary artery  . CHOLECYSTECTOMY    . LEFT HEART CATHETERIZATION WITH CORONARY ANGIOGRAM N/A 04/13/2012   Procedure: LEFT HEART CATHETERIZATION WITH CORONARY ANGIOGRAM;  Surgeon: Hillary Bow, MD;  Location: [facility] CATH LAB;  Service: Cardiovascular;  Laterality: N/A;  . SPINE SURGERY    . VARICOSE VEIN SURGERY    . VESICOVAGINAL FISTULA CLOSURE W/ TAH      OB History    Gravida Para Term Preterm AB Living   3 2 2  0 1 2   SAB TAB Ectopic Multiple Live Births   1               Home Medications    Prior to Admission medications   Medication Sig Start Date End Date Taking? Authorizing Provider  amLODipine (NORVASC) 10 MG tablet Take 1 tablet by mouth daily. 04/05/17   [provider]  aspirin 81 MG chewable tablet Chew 81 mg by mouth daily.     [provider]  atorvastatin (LIPITOR) 20 MG tablet Take 20 mg by mouth every morning.     [provider]  cholecalciferol (VITAMIN D) 1000 UNITS tablet Take 1,000 Units by mouth daily.    [provider]  furosemide (LASIX) 20 MG tablet Take 20 mg by mouth daily as needed for fluid.     [provider]  hydrochlorothiazide (HYDRODIURIL) 12.5 MG tablet Take 6.25 mg by mouth daily as needed (for High Blood Pressure >145/98). Second line medication.    [provider]  hydrocortisone (CORTEF) 10 MG tablet  Take 10-20 mg by mouth 2 (two) times daily. Takes 20 mg in the morning and 10 mg at night 04/07/16   [provider]  levothyroxine (SYNTHROID, LEVOTHROID) 75 MCG tablet Take 75 mcg by mouth daily before breakfast.    [provider]  LORazepam (ATIVAN) 2 MG tablet Take 2 mg by mouth every 8 (eight) hours as needed for anxiety.  05/06/16   [provider]  meclizine (ANTIVERT) 25 MG tablet 1 or 2 tabs PO q8h prn dizziness Patient taking differently: Take 25-50 mg by mouth 3 (three) times daily as needed (for vertigo).  04/16/16   Francine Graven, DO  metoCLOPramide (REGLAN) 10 MG tablet Take 10 mg by mouth 4 (four) times daily - after meals and at bedtime. 07/11/17   [provider]  nitroGLYCERIN (NITROSTAT) 0.4 MG SL tablet Place 0.4 mg under the tongue every 5 (five) minutes as needed for chest pain.    [provider]  oxycodone (ROXICODONE) 30 MG immediate release tablet Take 30 mg by mouth every 4 (four) hours as needed for pain.    [provider]  oxyCODONE-acetaminophen (PERCOCET) 5-325 MG tablet Take 1 tablet by mouth every 4 (four) hours as needed. Patient not taking: Reported on 09/08/2017 04/16/17   Larene Pickett, PA-C  pantoprazole (PROTONIX) 20 MG tablet Take 1 tablet (20 mg total) by mouth daily. Patient taking differently: Take 20 mg by mouth daily as needed for indigestion.  02/04/16   Ward, Ozella Almond, PA-C  vitamin C (ASCORBIC ACID) 500 MG tablet Take 500 mg by mouth daily.    [provider]  zolpidem (AMBIEN) 10 MG tablet Take 10 mg by mouth at bedtime. 05/06/16   [provider]    Family History Family History  Problem Relation Age of Onset  . Cancer Father        Colon  . Coronary artery disease Unknown   . Heart attack Mother     Social History Social History   Tobacco Use  . Smoking status: Current Every Day Smoker    Packs/day: 0.25    Years: 10.00    Pack years: 2.50    Types: Cigarettes   . Smokeless tobacco: Never Used  . Tobacco comment: smokes 2 a day  Substance Use Topics  . Alcohol use: No  . Drug use: No    Comment: Hisrory of Cocaine use      Allergies   Bee venom; Doxycycline; Erythromycin; Ibuprofen; Morphine and related; Penicillins; Sulfa antibiotics; Cephalexin; Dust mite extract; Tramadol; Other; and Sulfonamide derivatives   Review of Systems Review of Systems  Constitutional: Negative for fever.  Gastrointestinal: Positive for abdominal pain, nausea and vomiting.  All other systems reviewed  and are negative.    Physical Exam Updated Vital Signs BP (!) 157/93 (BP Location: Right Arm)   Pulse 89   Temp 98.4 F (36.9 C) (Oral)   Resp 18   Ht 5' 5.5" (1.664 m)   Wt 81.6 kg (180 lb)   SpO2 95%   BMI 29.50 kg/m   Physical Exam  Constitutional: She is oriented to person, place, and time. She appears well-developed and well-nourished. No distress.  HENT:  Head: Normocephalic and atraumatic.  Neck: Normal range of motion. Neck supple.  Cardiovascular: Normal rate and regular rhythm. Exam reveals no gallop and no friction rub.  No murmur heard. Pulmonary/Chest: Effort normal and breath sounds normal. No respiratory distress. She has no wheezes.  Abdominal: Soft. Bowel sounds are normal. She exhibits no distension. There is tenderness in the epigastric area. There is no rigidity, no rebound and no guarding.  Musculoskeletal: Normal range of motion.  Neurological: She is alert and oriented to person, place, and time.  Skin: Skin is warm and dry. She is not diaphoretic.  Nursing note and vitals reviewed.    ED Treatments / Results  Labs (all labs ordered are listed, but only abnormal results are displayed) Labs Reviewed  COMPREHENSIVE METABOLIC PANEL  LIPASE, BLOOD  CBC WITH DIFFERENTIAL/PLATELET    EKG  EKG Interpretation None       Radiology No results found.  Procedures Procedures (including critical care  time)  Medications Ordered in ED Medications  sodium chloride 0.9 % bolus 1,000 mL (not administered)  HYDROmorphone (DILAUDID) injection 1 mg (not administered)  metoCLOPramide (REGLAN) injection 10 mg (not administered)  pantoprazole (PROTONIX) injection 40 mg (not administered)     Initial Impression / Assessment and Plan / ED Course  I have reviewed the triage vital signs and the nursing notes.  Pertinent labs & imaging results that were available during my care of the patient were reviewed by me and considered in my medical decision making (see chart for details).  Patient with history of gastroparesis presenting for evaluation of abdominal cramping, nausea, vomiting consistent with a flareup of her condition.  She was given IV fluids and multiple medications and now is feeling better.  Her workup reveals a mildly elevated white count, however abdomen is benign.  Her EKG and troponin are reassuring.  At this point, I feel as though she is appropriate for discharge, to follow-up as needed.  Final Clinical Impressions(s) / ED Diagnoses   Final diagnoses:  None    ED Discharge Orders    None       Veryl Speak, MD 10/17/17 [phone]

## 2017-11-10 ENCOUNTER — Ambulatory Visit (HOSPITAL_COMMUNITY)
Admission: RE | Admit: 2017-11-10 | Discharge: 2017-11-10 | Disposition: A | Discharge status: Home / Self Care | Payer: Medicare Other | Source: Ambulatory Visit | Attending: Internal Medicine | Admitting: Internal Medicine

## 2017-11-10 ENCOUNTER — Other Ambulatory Visit (HOSPITAL_COMMUNITY): Payer: Self-pay | Admitting: Internal Medicine

## 2017-11-10 DIAGNOSIS — R059 Cough, unspecified: Secondary | ICD-10-CM

## 2017-11-10 DIAGNOSIS — R05 Cough: Secondary | ICD-10-CM | POA: Insufficient documentation

## 2017-11-10 DIAGNOSIS — Z1389 Encounter for screening for other disorder: Secondary | ICD-10-CM | POA: Diagnosis not present

## 2017-11-10 DIAGNOSIS — F419 Anxiety disorder, unspecified: Secondary | ICD-10-CM | POA: Diagnosis not present

## 2017-11-10 DIAGNOSIS — E063 Autoimmune thyroiditis: Secondary | ICD-10-CM | POA: Diagnosis not present

## 2017-11-10 DIAGNOSIS — Z6829 Body mass index (BMI) 29.0-29.9, adult: Secondary | ICD-10-CM | POA: Diagnosis not present

## 2017-11-10 DIAGNOSIS — R0789 Other chest pain: Secondary | ICD-10-CM | POA: Diagnosis not present

## 2017-11-10 DIAGNOSIS — E663 Overweight: Secondary | ICD-10-CM | POA: Diagnosis not present

## 2017-11-10 DIAGNOSIS — J9801 Acute bronchospasm: Secondary | ICD-10-CM | POA: Diagnosis not present

## 2017-11-10 DIAGNOSIS — E271 Primary adrenocortical insufficiency: Secondary | ICD-10-CM | POA: Diagnosis not present

## 2017-11-10 DIAGNOSIS — G894 Chronic pain syndrome: Secondary | ICD-10-CM | POA: Diagnosis not present

## 2017-11-10 DIAGNOSIS — J329 Chronic sinusitis, unspecified: Secondary | ICD-10-CM | POA: Diagnosis not present

## 2017-12-08 IMAGING — DX DG CHEST 2V
2 series · 2 of 2 positions shown · non-contrast
Comparison: 09/05/2016

CLINICAL DATA: Chest pain and shortness of breath for 1 day

EXAM:
CHEST  2 VIEW

[chest pa]
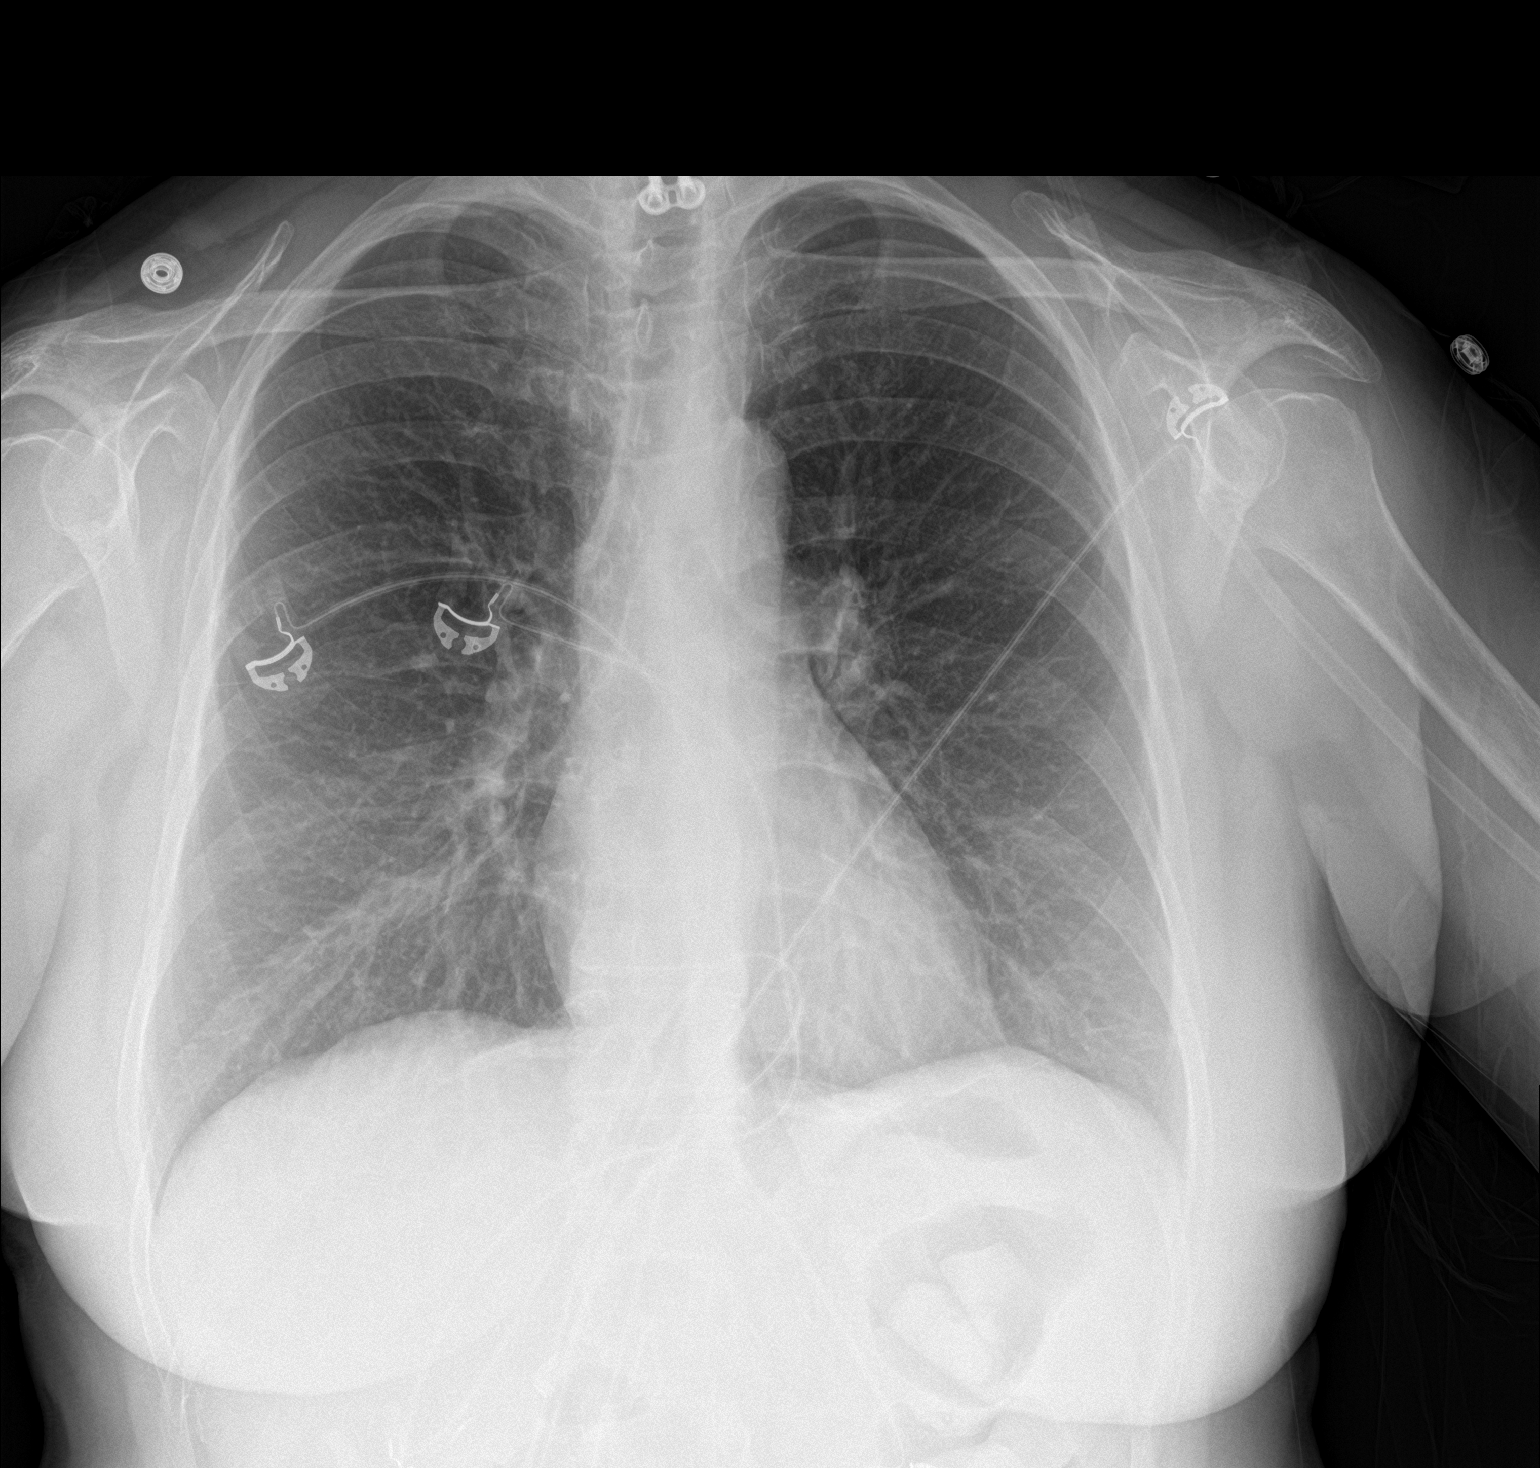

[chest lat]
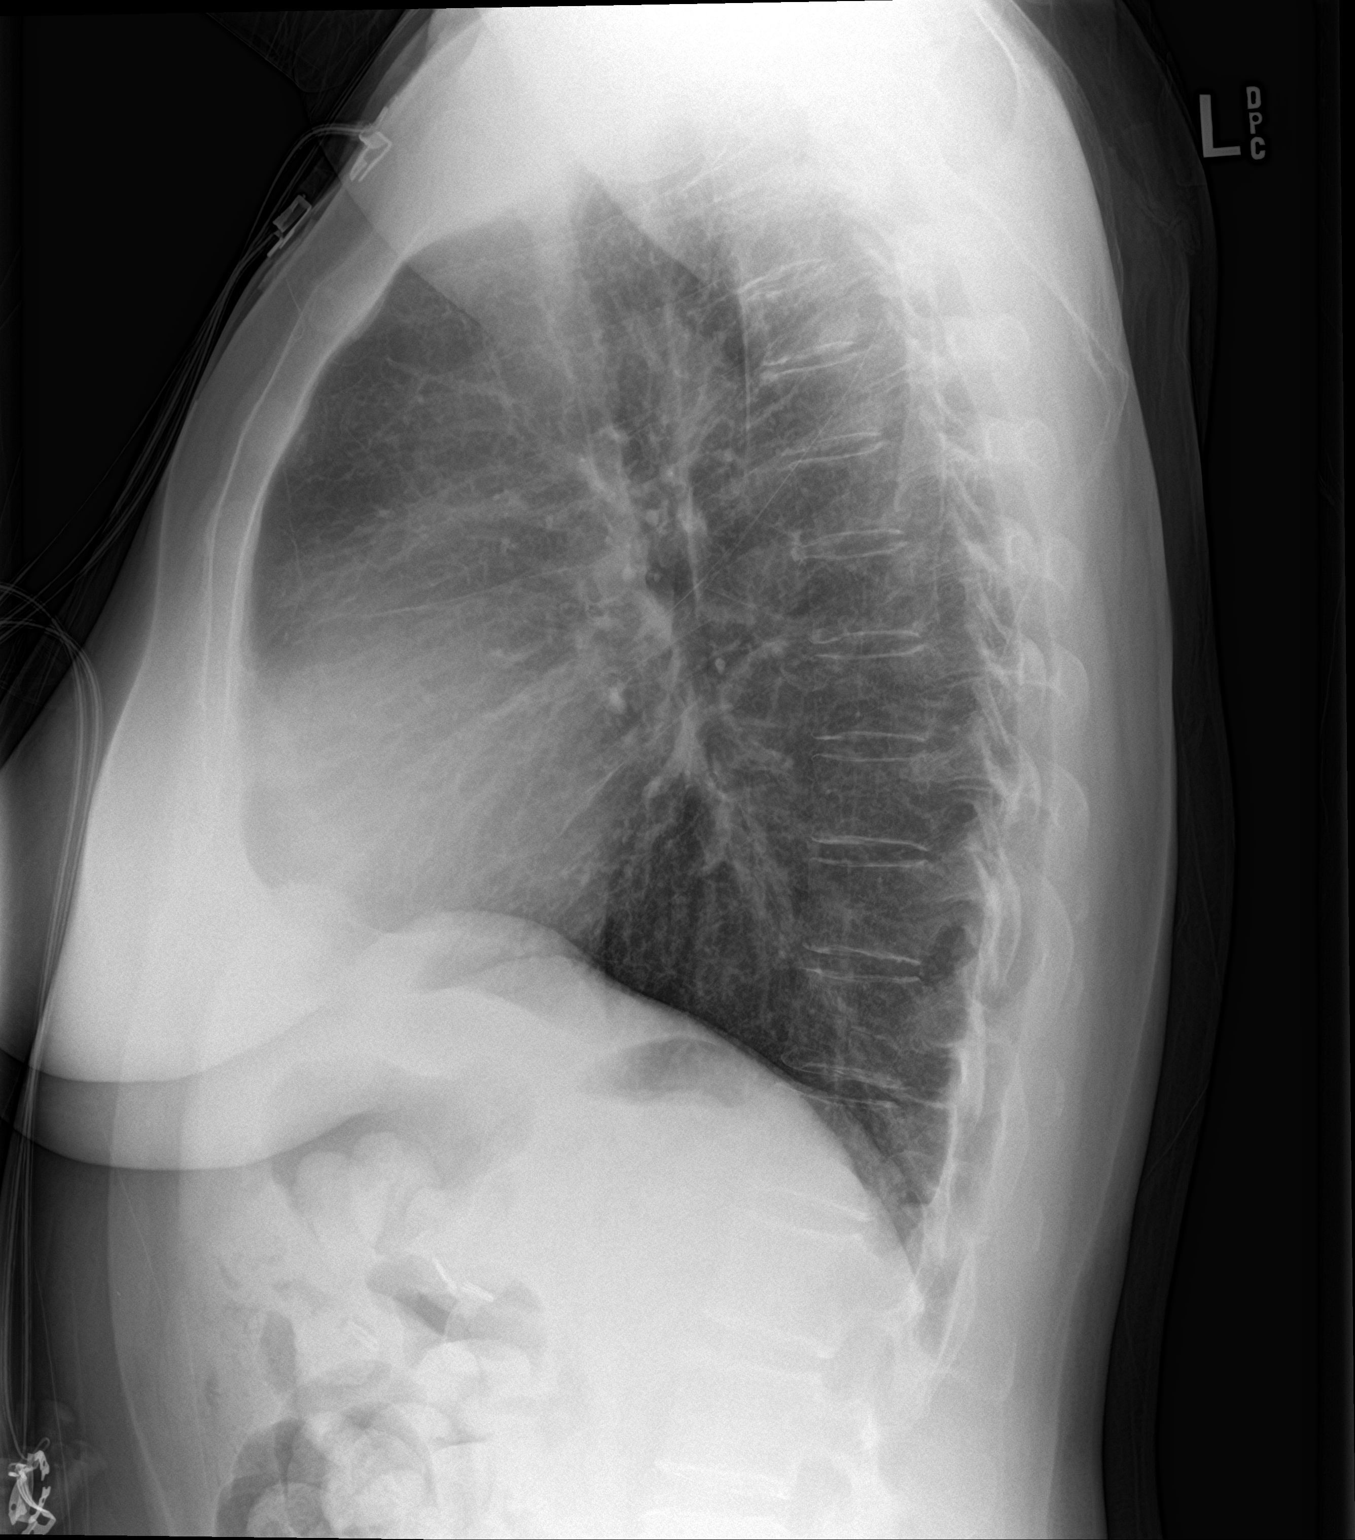

[2 of 2 positions shown; findings below may reference images not displayed]

FINDINGS: Cardiac shadow is within normal limits. The lungs are well aerated
bilaterally. No focal infiltrate is seen. No acute bony abnormality
is noted. Postsurgical changes in the cervical spine are seen.
IMPRESSION: No active cardiopulmonary disease.

## 2017-12-09 IMAGING — CT CT ANGIO CHEST
2 of 6 series · 19 of 36 positions shown · IV contrast (Isovue)
Comparison: 05/25/2016

CLINICAL DATA: Chest pain and nausea, onset at [DATE]

EXAM:
CT ANGIOGRAPHY CHEST WITH CONTRAST
TECHNIQUE: Multidetector CT imaging of the chest was performed using the
standard protocol during bolus administration of intravenous
contrast. Multiplanar CT image reconstructions and MIPs were
obtained to evaluate the vascular anatomy.
CONTRAST:  100 mL Isovue 370 intravenous

[Series 8: thins · axial · 0.85mm/px · z∈[+1200,+1452]mm · 18 of 282 slices shown]
[im 15/282  lung]
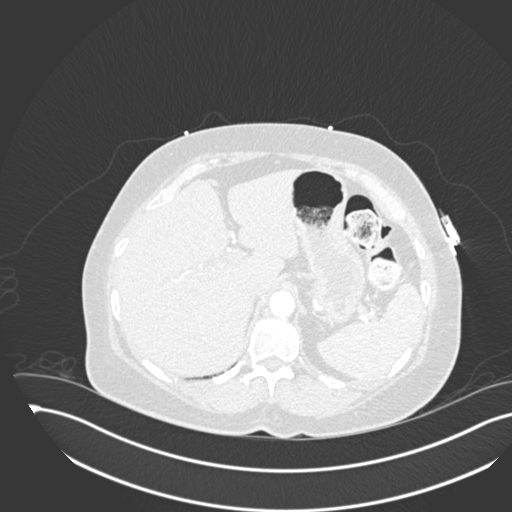
[im 29/282  mediastinal]
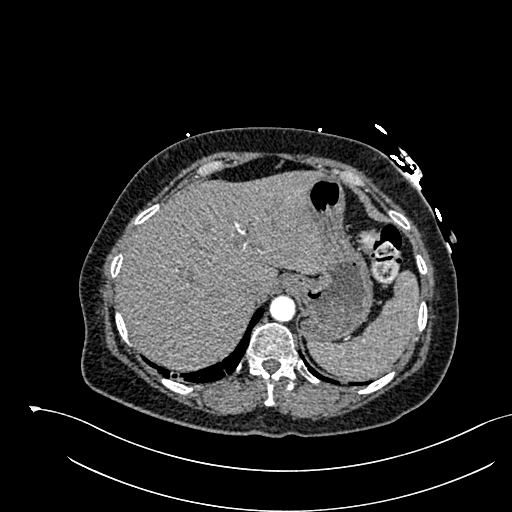
[im 43/282  lung]
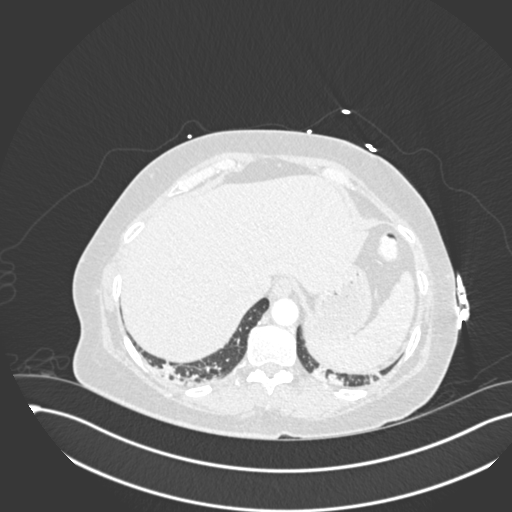
[im 57/282  mediastinal]
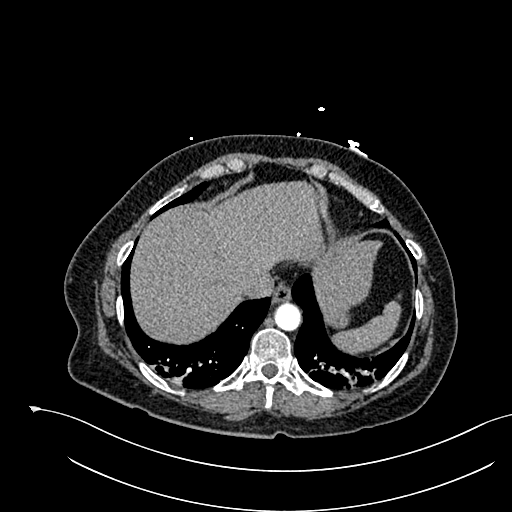
[im 71/282  lung]
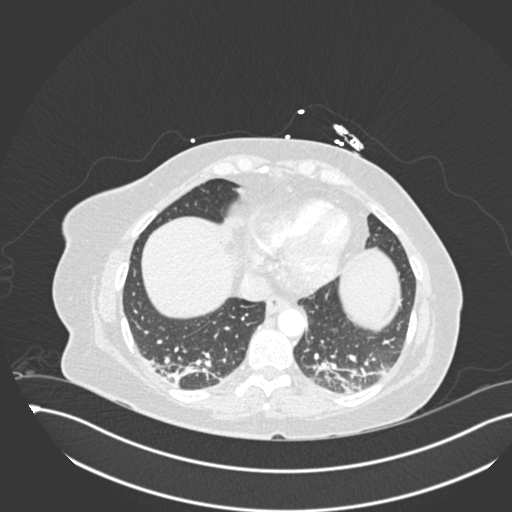
[im 85/282  mediastinal]
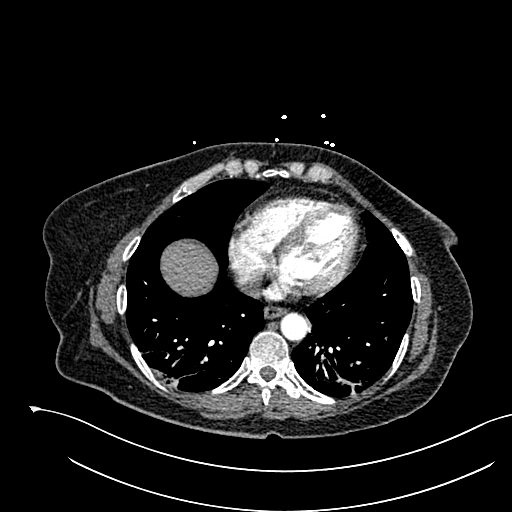
[im 99/282  lung]
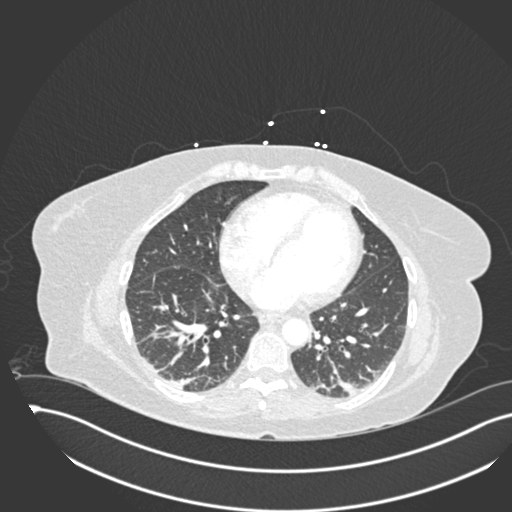
[im 113/282  mediastinal]
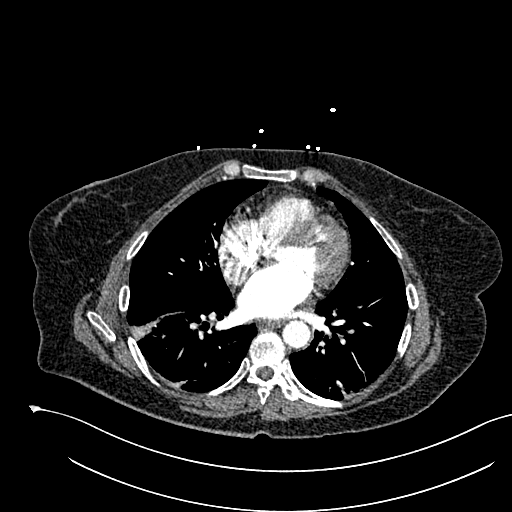
[im 127/282  lung]
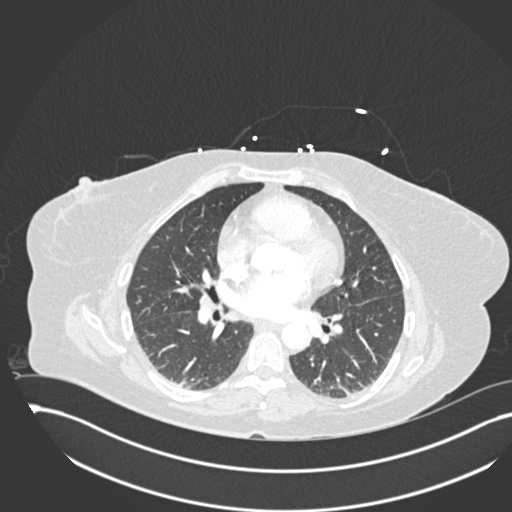
[im 155/282  mediastinal]
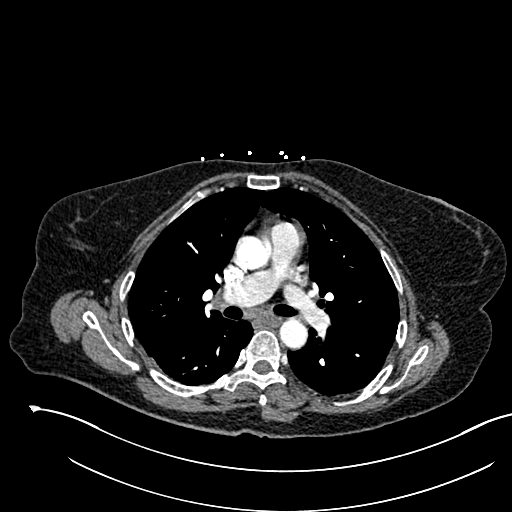
[im 169/282  lung]
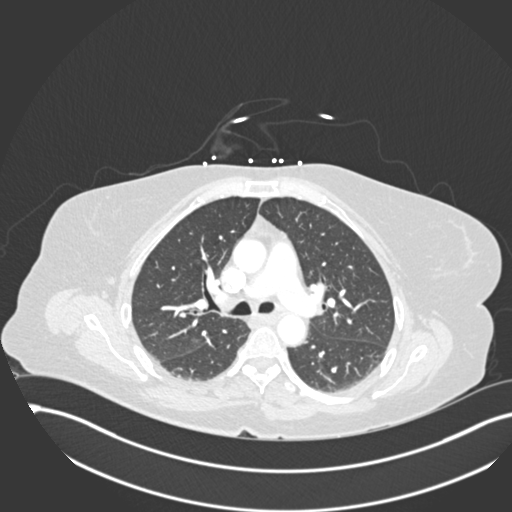
[im 183/282  mediastinal]
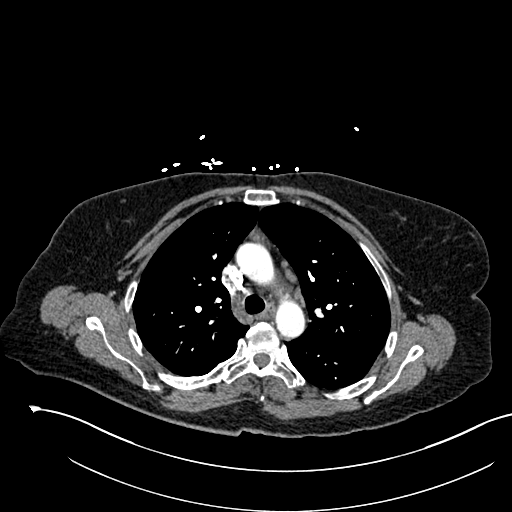
[im 197/282  lung]
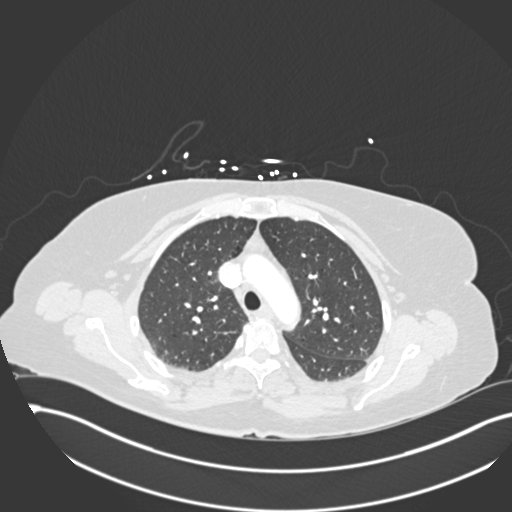
[im 211/282  mediastinal]
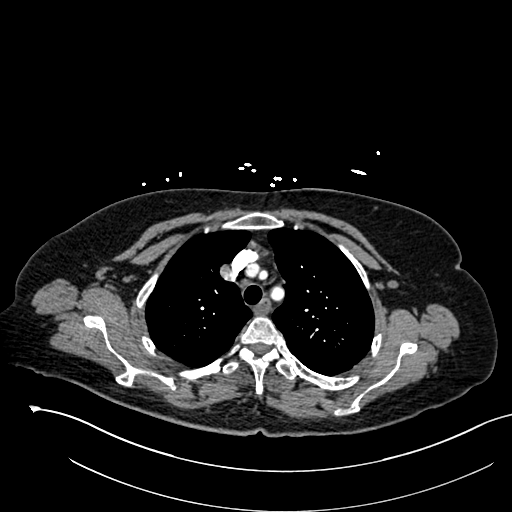
[im 225/282  lung]
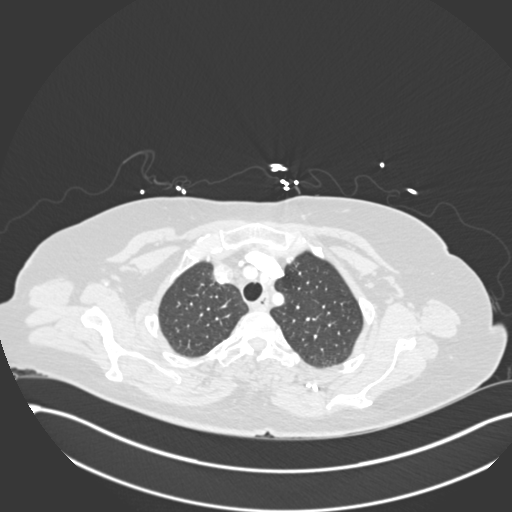
[im 239/282  mediastinal]
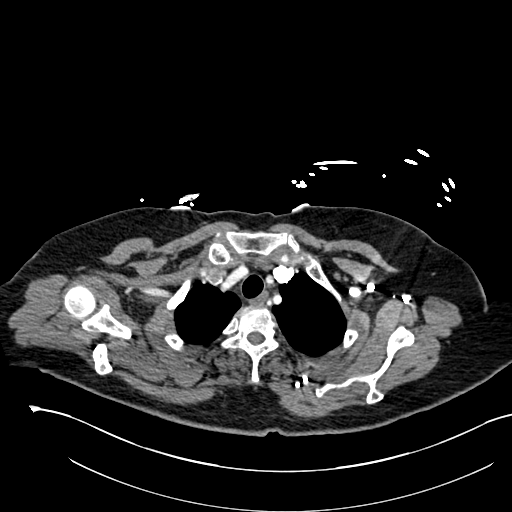
[im 253/282  lung]
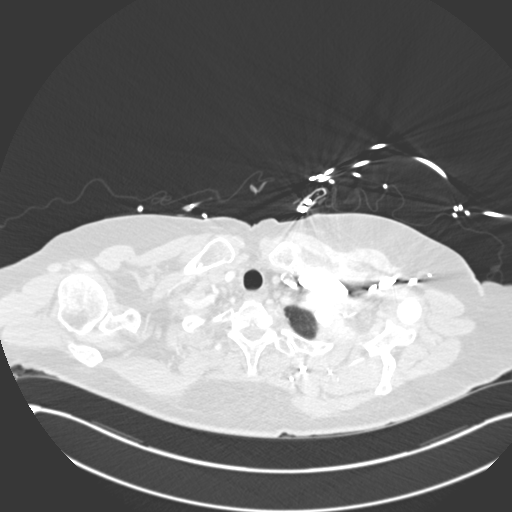
[im 267/282  mediastinal]
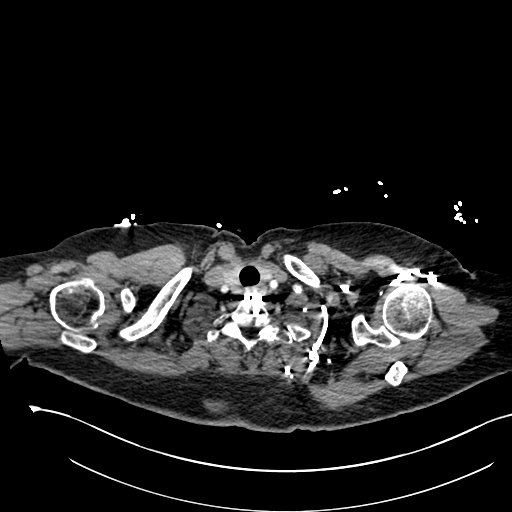

[Series 10: coronal mpr · coronal · 0.62mm/px · 1 of 151 slices shown]
[im 76/151  mediastinal]
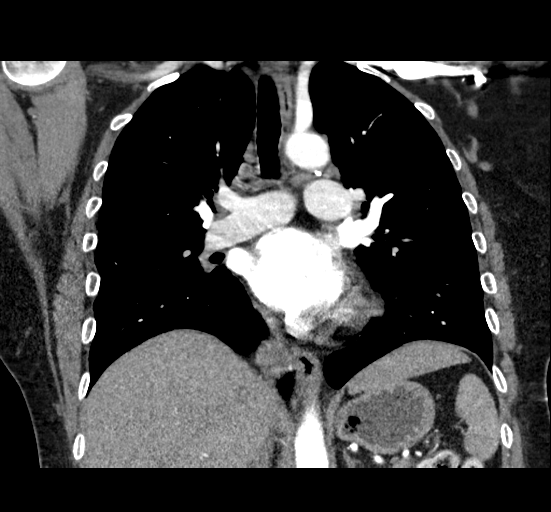

[19 of 36 positions shown; findings below may reference images not displayed]

FINDINGS: Cardiovascular: Satisfactory opacification of the pulmonary arteries
to the segmental level. No evidence of pulmonary embolism. Normal
heart size. No pericardial effusion.

Mediastinum/Nodes: No enlarged mediastinal, hilar, or axillary lymph
nodes. Thyroid gland, trachea, and esophagus demonstrate no
significant findings.

Lungs/Pleura: Linear atelectatic appearing opacities in the
posterior bases bilaterally. No confluent airspace consolidation. No
pleural effusions. Airways are patent and normal in caliber.

Upper Abdomen: No acute findings

Musculoskeletal: No significant skeletal lesions.

Review of the MIP images confirms the above findings.
IMPRESSION: Negative for pulmonary embolism. Atelectatic appearing posterior
lung base opacities bilaterally.

## 2017-12-11 ENCOUNTER — Other Ambulatory Visit: Payer: Self-pay

## 2017-12-11 ENCOUNTER — Encounter (HOSPITAL_COMMUNITY): Payer: Self-pay | Admitting: Emergency Medicine

## 2017-12-11 ENCOUNTER — Emergency Department (HOSPITAL_COMMUNITY)
Admission: EM | Admit: 2017-12-11 | Discharge: 2017-12-11 | Disposition: A | Discharge status: Home / Self Care | Payer: Medicare Other | Attending: Emergency Medicine | Admitting: Emergency Medicine

## 2017-12-11 DIAGNOSIS — F17210 Nicotine dependence, cigarettes, uncomplicated: Secondary | ICD-10-CM | POA: Insufficient documentation

## 2017-12-11 DIAGNOSIS — I509 Heart failure, unspecified: Secondary | ICD-10-CM | POA: Diagnosis not present

## 2017-12-11 DIAGNOSIS — Z79899 Other long term (current) drug therapy: Secondary | ICD-10-CM | POA: Diagnosis not present

## 2017-12-11 DIAGNOSIS — R1013 Epigastric pain: Secondary | ICD-10-CM | POA: Diagnosis not present

## 2017-12-11 DIAGNOSIS — Z7982 Long term (current) use of aspirin: Secondary | ICD-10-CM | POA: Insufficient documentation

## 2017-12-11 DIAGNOSIS — E039 Hypothyroidism, unspecified: Secondary | ICD-10-CM | POA: Diagnosis not present

## 2017-12-11 DIAGNOSIS — R197 Diarrhea, unspecified: Secondary | ICD-10-CM | POA: Diagnosis not present

## 2017-12-11 DIAGNOSIS — I110 Hypertensive heart disease with heart failure: Secondary | ICD-10-CM | POA: Diagnosis not present

## 2017-12-11 DIAGNOSIS — R112 Nausea with vomiting, unspecified: Secondary | ICD-10-CM | POA: Diagnosis not present

## 2017-12-11 DIAGNOSIS — I2510 Atherosclerotic heart disease of native coronary artery without angina pectoris: Secondary | ICD-10-CM | POA: Insufficient documentation

## 2017-12-11 DIAGNOSIS — K2970 Gastritis, unspecified, without bleeding: Secondary | ICD-10-CM | POA: Diagnosis not present

## 2017-12-11 LAB — URINALYSIS, ROUTINE W REFLEX MICROSCOPIC
BILIRUBIN URINE: NEGATIVE
Bacteria, UA: NONE SEEN
Glucose, UA: NEGATIVE mg/dL
Hgb urine dipstick: NEGATIVE
Ketones, ur: NEGATIVE mg/dL
Nitrite: NEGATIVE
Protein, ur: NEGATIVE mg/dL
SPECIFIC GRAVITY, URINE: 1.017 (ref 1.005–1.030)
pH: 8 (ref 5.0–8.0)

## 2017-12-11 LAB — CBC WITH DIFFERENTIAL/PLATELET
BASOS ABS: 0 10*3/uL (ref 0.0–0.1)
Basophils Relative: 0 %
EOS ABS: 0.3 10*3/uL (ref 0.0–0.7)
Eosinophils Relative: 2 %
HCT: 46.4 % — ABNORMAL HIGH (ref 36.0–46.0)
HEMOGLOBIN: 15 g/dL (ref 12.0–15.0)
Lymphocytes Relative: 26 %
Lymphs Abs: 4.4 10*3/uL — ABNORMAL HIGH (ref 0.7–4.0)
MCH: 27.4 pg (ref 26.0–34.0)
MCHC: 32.3 g/dL (ref 30.0–36.0)
MCV: 84.7 fL (ref 78.0–100.0)
Monocytes Absolute: 1.1 10*3/uL — ABNORMAL HIGH (ref 0.1–1.0)
Monocytes Relative: 7 %
NEUTROS PCT: 65 %
Neutro Abs: 10.6 10*3/uL — ABNORMAL HIGH (ref 1.7–7.7)
Platelets: 365 10*3/uL (ref 150–400)
RBC: 5.48 MIL/uL — AB (ref 3.87–5.11)
RDW: 14.1 % (ref 11.5–15.5)
WBC: 16.5 10*3/uL — AB (ref 4.0–10.5)

## 2017-12-11 LAB — COMPREHENSIVE METABOLIC PANEL
ALBUMIN: 4 g/dL (ref 3.5–5.0)
ALK PHOS: 114 U/L (ref 38–126)
ALT: 14 U/L (ref 14–54)
ANION GAP: 13 (ref 5–15)
AST: 19 U/L (ref 15–41)
BUN: 17 mg/dL (ref 6–20)
CALCIUM: 11.1 mg/dL — AB (ref 8.9–10.3)
CO2: 26 mmol/L (ref 22–32)
Chloride: 101 mmol/L (ref 101–111)
Creatinine, Ser: 0.86 mg/dL (ref 0.44–1.00)
GFR calc Af Amer: >60 mL/min (ref >60)
GFR calc non Af Amer: >60 mL/min (ref >60)
GLUCOSE: 106 mg/dL — AB (ref 65–99)
Potassium: 3.7 mmol/L (ref 3.5–5.1)
SODIUM: 140 mmol/L (ref 135–145)
Total Bilirubin: 0.8 mg/dL (ref 0.3–1.2)
Total Protein: 8 g/dL (ref 6.5–8.1)

## 2017-12-11 LAB — MAGNESIUM: MAGNESIUM: 1.5 mg/dL — AB (ref 1.7–2.4)

## 2017-12-11 LAB — LIPASE, BLOOD: Lipase: 31 U/L (ref 11–51)

## 2017-12-11 MED ORDER — PROMETHAZINE HCL 25 MG RE SUPP: 25.0000 mg | Freq: Four times a day (QID) | RECTAL | 0 refills | Status: DC | PRN

## 2017-12-11 MED ORDER — ONDANSETRON 4 MG PO TBDP: 4.0000 mg | ORAL_TABLET | Freq: Three times a day (TID) | ORAL | 0 refills | Status: DC | PRN

## 2017-12-11 MED ORDER — METOCLOPRAMIDE HCL 5 MG/ML IJ SOLN
10.0000 mg | Freq: Once | INTRAMUSCULAR | Status: AC
  Administered 2017-12-11: 10 mg via INTRAVENOUS
  Filled 2017-12-11: qty 2

## 2017-12-11 MED ORDER — ONDANSETRON HCL 4 MG/2ML IJ SOLN
4.0000 mg | Freq: Once | INTRAMUSCULAR | Status: AC
  Administered 2017-12-11: 4 mg via INTRAVENOUS
  Filled 2017-12-11: qty 2

## 2017-12-11 MED ORDER — DIPHENHYDRAMINE HCL 50 MG/ML IJ SOLN
25.0000 mg | Freq: Once | INTRAMUSCULAR | Status: AC
  Administered 2017-12-11: 25 mg via INTRAVENOUS
  Filled 2017-12-11: qty 1

## 2017-12-11 MED ORDER — HYDROMORPHONE HCL 1 MG/ML IJ SOLN
1.0000 mg | Freq: Once | INTRAMUSCULAR | Status: AC
  Administered 2017-12-11: 1 mg via INTRAVENOUS
  Filled 2017-12-11: qty 1

## 2017-12-11 NOTE — ED Notes (Signed)
Pt attempted to take prescribed anti-emetic medications this morning but threw them up and Pt states she has not had any relief of symptoms.

## 2017-12-11 NOTE — Discharge Instructions (Signed)
Your testing did not show any pathological or surgical cause of your symptoms. Your blood work showed an increase in your blood counts which occurs when you take steroids like hydrocortisone and when you are vomiting / nauseated You will likely continue to have some abdominal pain and bloating / gas today - this should gradually ease off - but taking Opiate pain medicines can make this worse - (as per our discussion). Please continue taking your Reglan, Please take Zofran every 6 hours as needed for nausea If the Zofran is not helpful, taken The Promethazine (Phenergan) suppository for nausea Drink only clear liquids for 24  hours - small amounts at a time.   Please obtain all of your results from medical records or have your doctors office obtain the results - share them with your doctor - you should be seen at your doctors office in the next 2 days. Call today to arrange your follow up. Take the medications as prescribed. Please review all of the medicines and only take them if you do not have an allergy to them. Please be aware that if you are taking birth control pills, taking other prescriptions, ESPECIALLY ANTIBIOTICS may make the birth control ineffective - if this is the case, either do not engage in sexual activity or use alternative methods of birth control such as condoms until you have finished the medicine and your family doctor says it is OK to restart them. If you are on a blood thinner such as COUMADIN, be aware that any other medicine that you take may cause the coumadin to either work too much, or not enough - you should have your coumadin level rechecked in next 7 days if this is the case.  ?  It is also a possibility that you have an allergic reaction to any of the medicines that you have been prescribed - Everybody reacts differently to medications and while MOST people have no trouble with most medicines, you may have a reaction such as nausea, vomiting, rash, swelling, shortness of  breath. If this is the case, please stop taking the medicine immediately and contact your physician.  ?  You should return to the ER if you develop severe or worsening symptoms.

## 2017-12-11 NOTE — ED Triage Notes (Signed)
Pt with hx of gastroparesis. Has been having nausea, vomiting, and diarrhea since 0200.

## 2017-12-11 NOTE — ED Notes (Signed)
Attempted twice in the patient's left [facility] and they both blew

## 2017-12-11 NOTE — ED Provider Notes (Signed)
[facility] EMERGENCY DEPARTMENT Provider Note   CSN: [id number] Arrival date & time: 12/11/17  0630     History   Chief Complaint Chief Complaint  Patient presents with  . Diarrhea    HPI [name] is a 52 y.o. female.  The history is provided by the patient.  She has history of hypertension, hyperlipidemia, gastroparesis, heart failure, coronary artery disease, adrenal insufficiency and comes in with upper abdominal pain and vomiting typical of her gastroparesis.  She had 2 episodes of diarrhea yesterday, but that has resolved.  She woke up at 2 AM with vomiting and is vomited 4 times.  She has not been able to hold any of her medications down.  This is associated with sharp upper abdominal pain which she rates at 8/10.  Nothing makes the pain better, nothing makes it worse.  Past Medical History:  Diagnosis Date  . Addison's disease ([place])   . Adrenal insufficiency ([place])    diagnosed 2012  . Aneurysm ([place])   . Anxiety   . Astigmatism   . CAD (coronary artery disease)    Cath 2008 EF normal. RCA 50-60, Septal 50%. Myoview 3/12: EF 53% normal perfusion  . Cardiac arrest ([place])    2/2 adissonian crisis  . Cardiomyopathy    resolved  . Chest pain    chronicc  . CHF (congestive heart failure) ([place])   . Chronic back pain   . Chronic diarrhea   . Concussion    sept 28th 2014  . Gastroparesis   . HTN (hypertension)   . Hyperlipidemia   . Hypothyroidism   . Mitral valve prolapse   . Nondiabetic gastroparesis   . QT prolongation   . Tobacco abuse    down to 2 cigarettes per day  . Vertigo     Patient Active Problem List   Diagnosis Date Noted  . Myositis 05/11/2016  . Anxiety 05/26/2015  . Tremors of nervous system 05/26/2015  . Neck pain 05/26/2015  . Hot flushes, perimenopausal 07/24/2013  . CAD (coronary artery disease) 04/11/2012  . GERD (gastroesophageal reflux disease) 04/11/2012  . Addison's disease ([place]) 03/03/2012  . QT prolongation 12/15/2010  .  Palpitations 01/01/2009  . Hypothyroidism 12/31/2008  . Hyperlipidemia 12/31/2008  . OVERWEIGHT/OBESITY 12/31/2008  . Essential hypertension 12/31/2008    Past Surgical History:  Procedure Laterality Date  . ABDOMINAL HYSTERECTOMY    . CARDIAC CATHETERIZATION  03/2007   showed 60% lesion in the right coronary artery  . CHOLECYSTECTOMY    . LEFT HEART CATHETERIZATION WITH CORONARY ANGIOGRAM N/A 04/13/2012   Procedure: LEFT HEART CATHETERIZATION WITH CORONARY ANGIOGRAM;  Surgeon: Hillary Bow, MD;  Location: [facility] CATH LAB;  Service: Cardiovascular;  Laterality: N/A;  . SPINE SURGERY    . VARICOSE VEIN SURGERY    . VESICOVAGINAL FISTULA CLOSURE W/ TAH       OB History    Gravida  3   Para  2   Term  2   Preterm  0   AB  1   Living  2     SAB  1   TAB      Ectopic      Multiple      Live Births               Home Medications    Prior to Admission medications   Medication Sig Start Date End Date Taking? Authorizing Provider  amLODipine (NORVASC) 10 MG tablet Take 1 tablet by mouth  daily. 04/05/17  Yes [provider]  aspirin 81 MG chewable tablet Chew 81 mg by mouth daily.    Yes [provider]  atorvastatin (LIPITOR) 20 MG tablet Take 20 mg by mouth every morning.    Yes [provider]  cholecalciferol (VITAMIN D) 1000 UNITS tablet Take 1,000 Units by mouth daily.   Yes [provider]  furosemide (LASIX) 20 MG tablet Take 20 mg by mouth daily as needed for fluid.    Yes [provider]  hydrochlorothiazide (HYDRODIURIL) 12.5 MG tablet Take 6.25 mg by mouth daily as needed (for High Blood Pressure >145/98). Second line medication.   Yes [provider]  hydrocortisone (CORTEF) 10 MG tablet Take 10-20 mg by mouth 2 (two) times daily. Takes 20 mg in the morning and 10 mg at night 04/07/16  Yes [provider]  levothyroxine (SYNTHROID, LEVOTHROID) 75 MCG tablet Take 75 mcg by mouth daily before  breakfast.   Yes [provider]  LORazepam (ATIVAN) 2 MG tablet Take 2 mg by mouth every 8 (eight) hours as needed for anxiety.  05/06/16  Yes [provider]  meclizine (ANTIVERT) 25 MG tablet 1 or 2 tabs PO q8h prn dizziness Patient taking differently: Take 25-50 mg by mouth 3 (three) times daily as needed (for vertigo).  04/16/16  Yes Francine Graven, DO  metoCLOPramide (REGLAN) 10 MG tablet Take 10 mg by mouth 4 (four) times daily - after meals and at bedtime. 07/11/17  Yes [provider]  nitroGLYCERIN (NITROSTAT) 0.4 MG SL tablet Place 0.4 mg under the tongue every 5 (five) minutes as needed for chest pain.   Yes [provider]  oxycodone (ROXICODONE) 30 MG immediate release tablet Take 30 mg by mouth every 4 (four) hours as needed for pain.   Yes [provider]  pantoprazole (PROTONIX) 20 MG tablet Take 1 tablet (20 mg total) by mouth daily. Patient taking differently: Take 20 mg by mouth daily as needed for indigestion.  02/04/16  Yes Ward, Ozella Almond, PA-C  vitamin C (ASCORBIC ACID) 500 MG tablet Take 500 mg by mouth daily.   Yes [provider]  zolpidem (AMBIEN) 10 MG tablet Take 10 mg by mouth at bedtime. 05/06/16  Yes [provider]  oxyCODONE-acetaminophen (PERCOCET) 5-325 MG tablet Take 1 tablet by mouth every 4 (four) hours as needed. Patient not taking: Reported on 09/08/2017 04/16/17   Larene Pickett, PA-C    Family History Family History  Problem Relation Age of Onset  . Cancer Father        Colon  . Coronary artery disease Unknown   . Heart attack Mother     Social History Social History   Tobacco Use  . Smoking status: Current Every Day Smoker    Packs/day: 0.25    Years: 10.00    Pack years: 2.50    Types: Cigarettes  . Smokeless tobacco: Never Used  . Tobacco comment: smokes 2 a day  Substance Use Topics  . Alcohol use: No  . Drug use: No    Comment: Hisrory of Cocaine use       Allergies   Bee venom; Doxycycline; Erythromycin; Ibuprofen; Morphine and related; Penicillins; Sulfa antibiotics; Cephalexin; Dust mite extract; Tramadol; Other; and Sulfonamide derivatives   Review of Systems Review of Systems  All other systems reviewed and are negative.    Physical Exam Updated Vital Signs BP (!) 154/99 (BP Location: Right Arm)   Pulse 87  Temp 98.4 F (36.9 C) (Oral)   Resp 16   Ht 5\' 5"  (1.651 m)   Wt 81.6 kg (180 lb)   SpO2 97%   BMI 29.95 kg/m   Physical Exam  Nursing note and vitals reviewed.  52 year old female, resting comfortably and in no acute distress. Vital signs are significant for elevated blood pressure. Oxygen saturation is 97%, which is normal. Head is normocephalic and atraumatic. PERRLA, EOMI. Oropharynx is clear. Neck is nontender and supple without adenopathy or JVD. Back is nontender and there is no CVA tenderness. Lungs are clear without rales, wheezes, or rhonchi. Chest is nontender. Heart has regular rate and rhythm without murmur. Abdomen is soft, flat, with mild epigastric tenderness.  There is no rebound or guarding.  There are no masses or hepatosplenomegaly and peristalsis is hypoactive. Extremities have no cyanosis or edema, full range of motion is present. Skin is warm and dry without rash. Neurologic: Mental status is normal, cranial nerves are intact, there are no motor or sensory deficits.  ED Treatments / Results  Labs (all labs ordered are listed, but only abnormal results are displayed) Labs Reviewed  LIPASE, BLOOD  COMPREHENSIVE METABOLIC PANEL  URINALYSIS, ROUTINE W REFLEX MICROSCOPIC  I-STAT BETA HCG BLOOD, ED ([facility], [facility], [facility] ONLY)    EKG None  Radiology No results found.  Procedures Procedures   Medications Ordered in ED Medications  HYDROmorphone (DILAUDID) injection 1 mg (has no administration in time range)  metoCLOPramide (REGLAN) injection 10 mg (has no administration in time range)   diphenhydrAMINE (BENADRYL) injection 25 mg (has no administration in time range)     Initial Impression / Assessment and Plan / ED Course  I have reviewed the triage vital signs and the nursing notes.  Pertinent labs & imaging results that were available during my care of the patient were reviewed by me and considered in my medical decision making (see chart for details).  Abdominal pain with vomiting in patient with known history of gastroparesis.  Symptoms are consistent with gastroparesis, but also could be from viral gastroenteritis.  Currently no diarrhea, so we will not give any antimotility agents.  Old records are reviewed confirming several past ED visits for gastroparesis.  She is reported to have a prolonged QT interval, so we will check magnesium and ECG.  She is given metoclopramide, diphenhydramine, hydromorphone as well as IV fluids.  Case is signed out to Dr. Sabra Heck.  Final Clinical Impressions(s) / ED Diagnoses   Final diagnoses:  Non-intractable vomiting with nausea, unspecified vomiting type  Epigastric pain    ED Discharge Orders    None       Delora Fuel, MD 95/62/13 2252

## 2017-12-11 NOTE — ED Provider Notes (Signed)
The patient presents to the hospital after having left upper and epigastric pain and multiple episodes of vomiting after eating a steak and cheese sandwich last night.  She reports that she was passing lots of gas which she states had a very foul smell.  Since arrival she has had significant improvement with less distention of her abdomen, less gas, her pain has been cut in half from a 10 to a 5 out of 10, she has much less nausea and is feeling better.  She is requesting more Dilaudid.  I took the time to examine her and explained to her about gastroparesis and the cause and why opiate medications may make this worse.  She is currently taking Reglan at home and states that she is compliant with his medication.  She will be given some intravenous Zofran prior to discharge, her EKG showed no QT prolongation, she is amenable to this plan.  I will also give her some per rectum suppositories as needed should she become too nauseated to take oral medications.  On my exam she has a soft abdomen with minimal left upper quadrant tenderness, no guarding, no tachycardia and she is well-appearing and ambulatory to the bathroom.  Labs show a slight leukocytosis but no other significant findings.  Urinalysis pending  Anticipate d/c  UA without acute findings of infection or dehydration, SG 1.017, Ketone neg, no bacteria and 0-5 WBC,     Noemi Chapel, MD 12/11/17 [phone]

## 2018-01-29 IMAGING — US US RENAL
1 series · 14 of 25 positions shown · non-contrast
Comparison: None.

CLINICAL DATA: CKD stage 3.

EXAM:
RENAL / URINARY TRACT ULTRASOUND COMPLETE

[Series 1: us renal · 0.23mm/px · 14 of 57 slices shown]
[im 1/57]
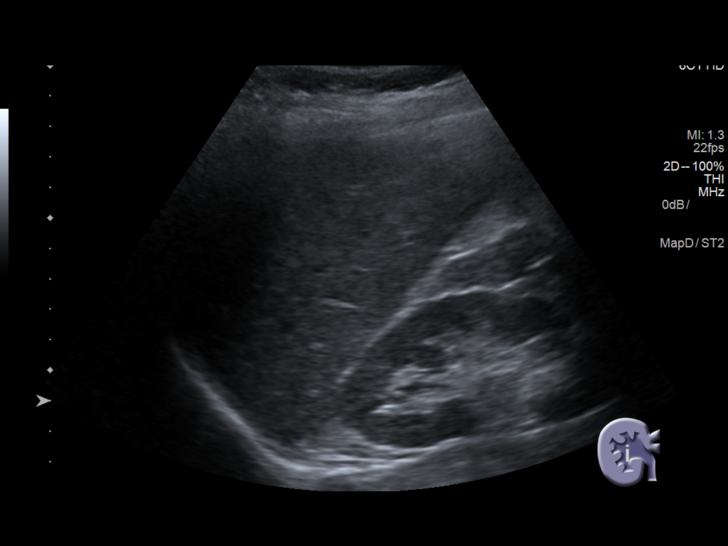
[im 5/57]
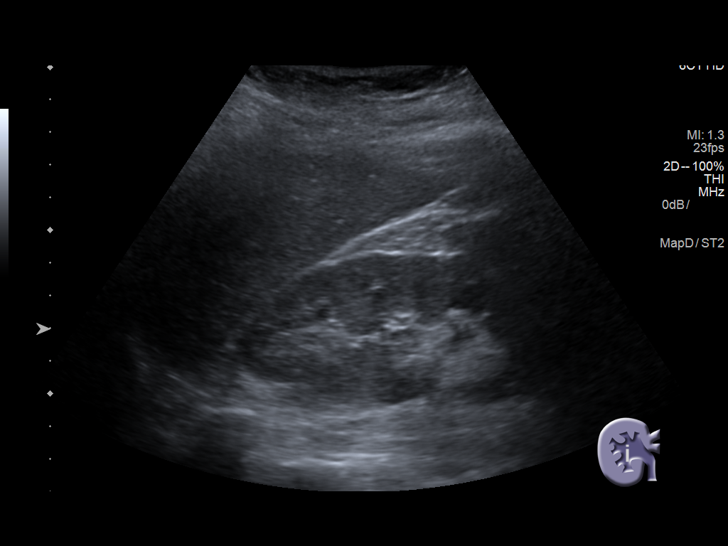
[im 10/57]
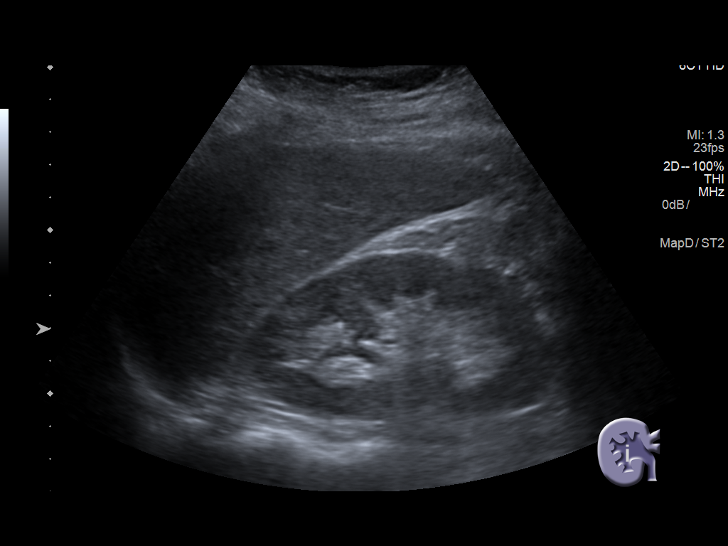
[im 15/57]
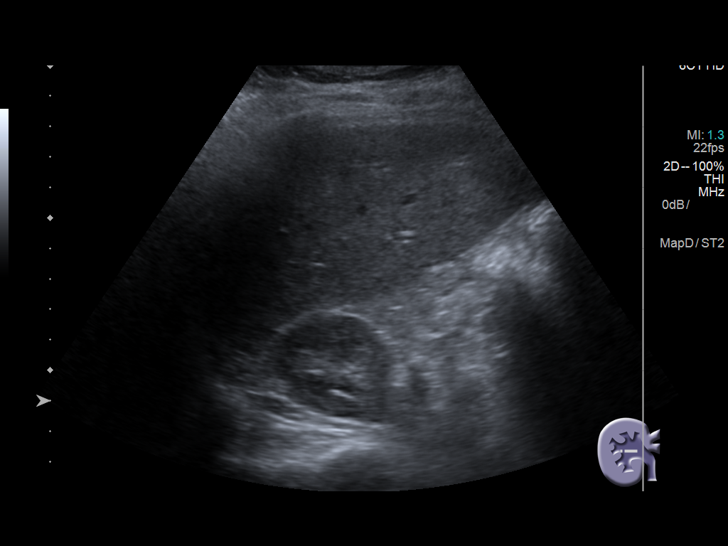
[im 19/57]
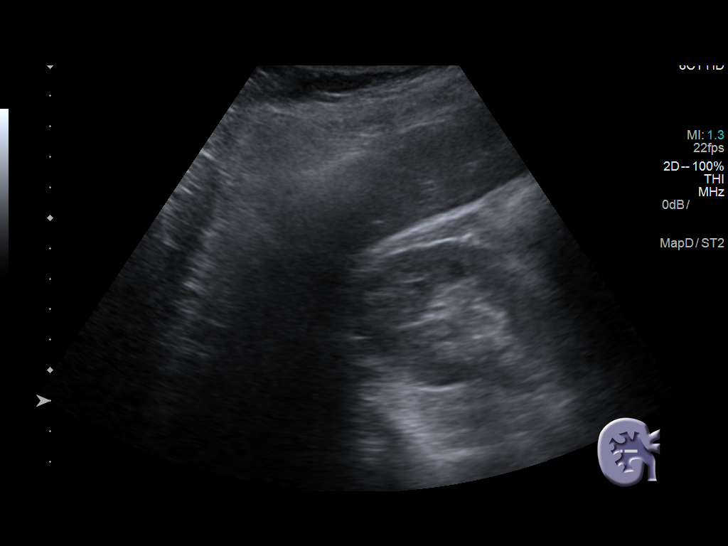
[im 22/57]
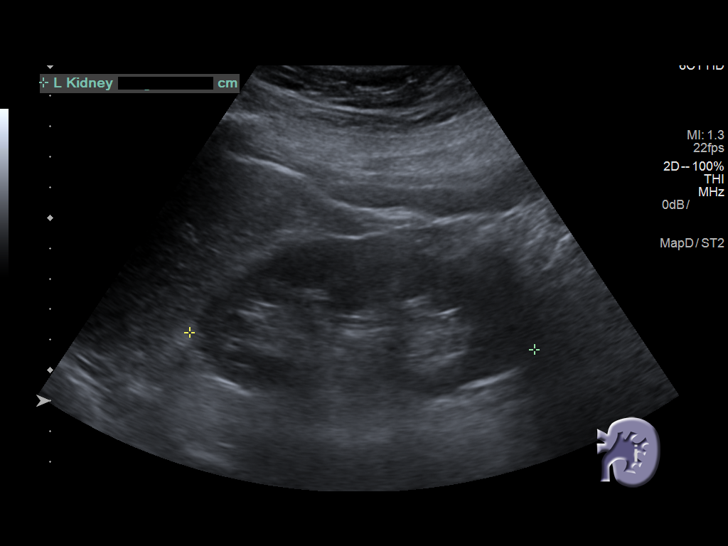
[im 26/57]
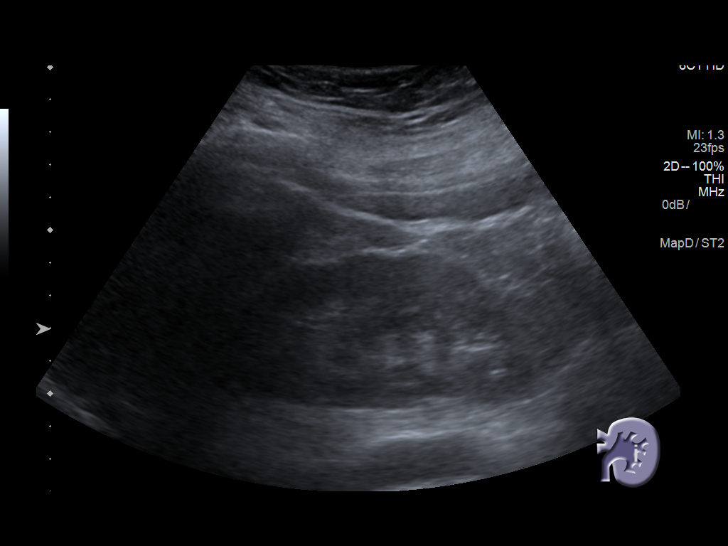
[im 31/57]
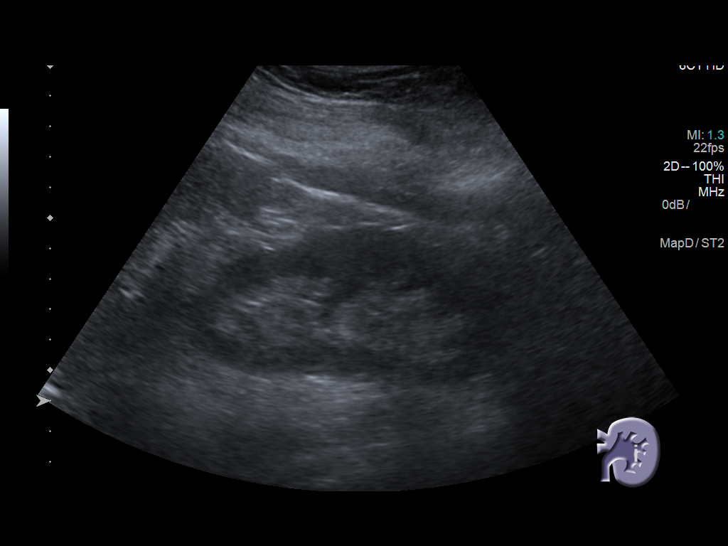
[im 36/57]
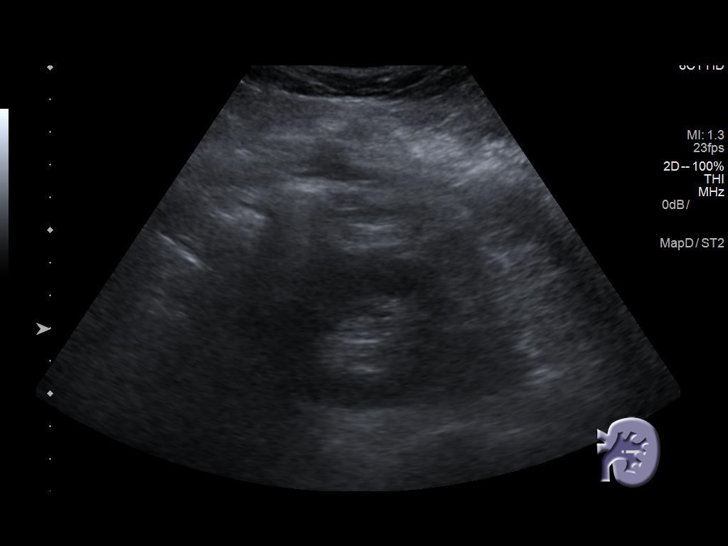
[im 38/57]
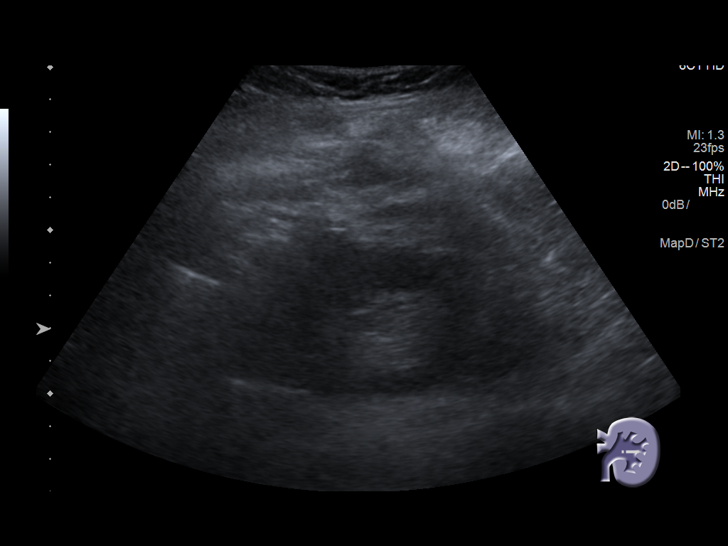
[im 43/57]
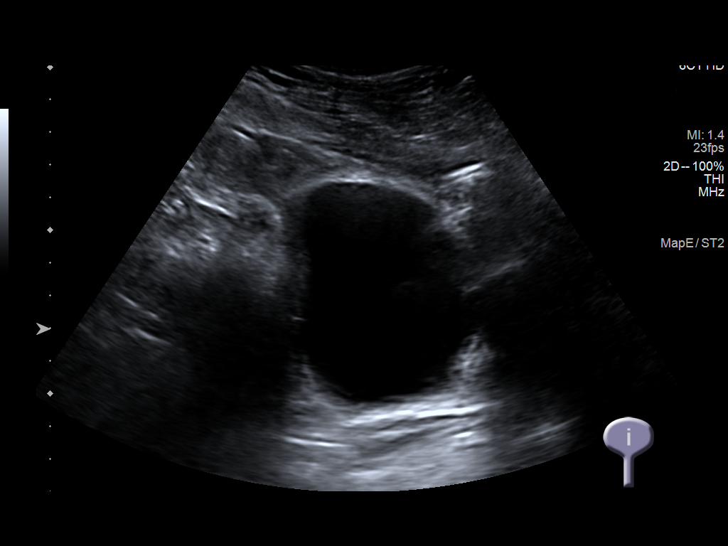
[im 47/57]
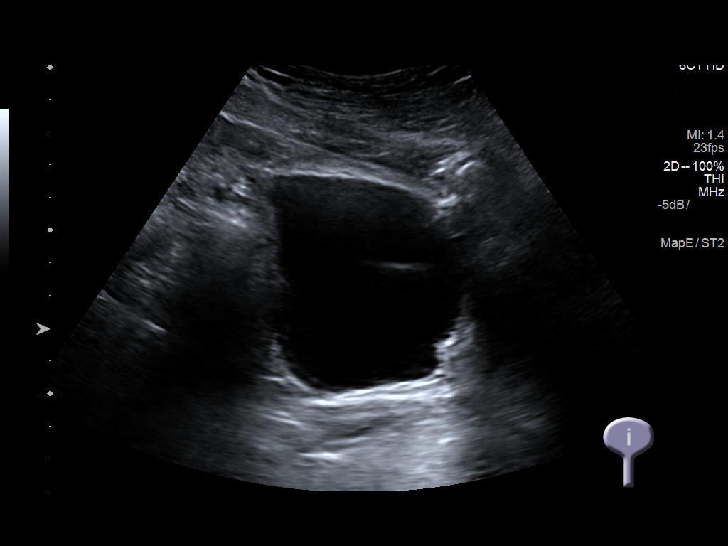
[im 52/57]
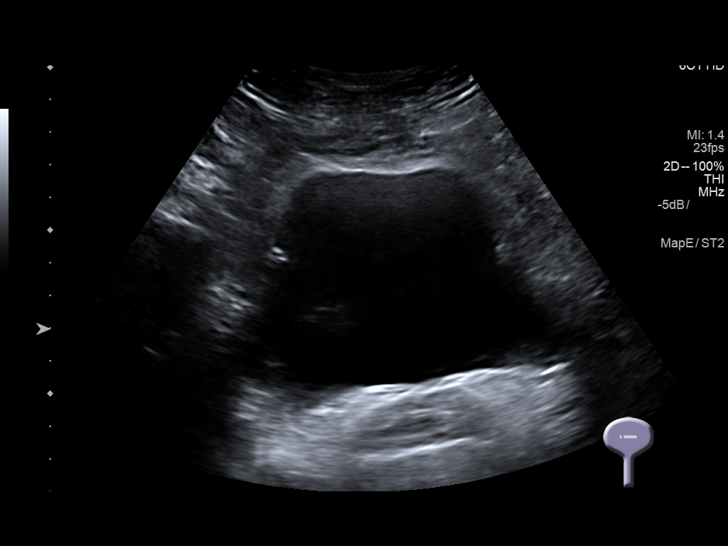
[im 57/57]
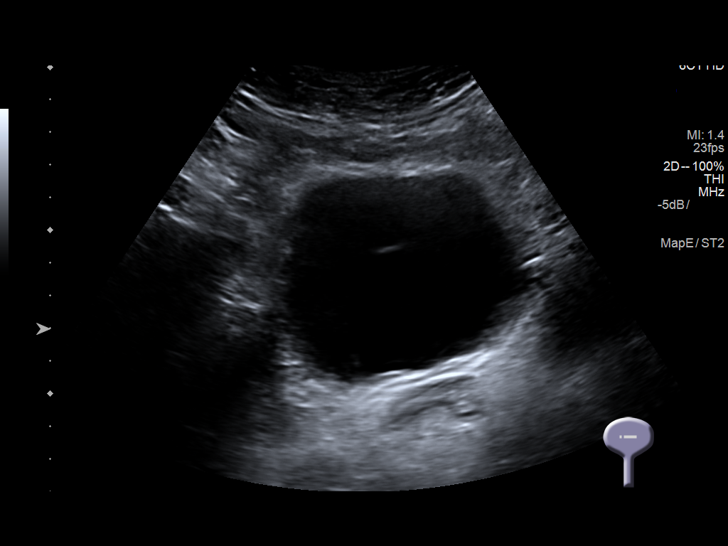

[14 of 25 positions shown; findings below may reference images not displayed]

FINDINGS: Right Kidney:

Length: 10 cm. Echogenicity within normal limits. No mass or
hydronephrosis visualized.

Left Kidney:

Length: 11.3 cm. Echogenicity within normal limits. No mass or
hydronephrosis visualized.

Bladder:

Appears normal for degree of bladder distention.
IMPRESSION: Normal renal ultrasound.

## 2018-02-01 DIAGNOSIS — Z6829 Body mass index (BMI) 29.0-29.9, adult: Secondary | ICD-10-CM | POA: Diagnosis not present

## 2018-02-01 DIAGNOSIS — G894 Chronic pain syndrome: Secondary | ICD-10-CM | POA: Diagnosis not present

## 2018-02-01 DIAGNOSIS — M1991 Primary osteoarthritis, unspecified site: Secondary | ICD-10-CM | POA: Diagnosis not present

## 2018-02-01 DIAGNOSIS — E663 Overweight: Secondary | ICD-10-CM | POA: Diagnosis not present

## 2018-02-01 DIAGNOSIS — H05223 Edema of bilateral orbit: Secondary | ICD-10-CM | POA: Diagnosis not present

## 2018-02-01 DIAGNOSIS — F419 Anxiety disorder, unspecified: Secondary | ICD-10-CM | POA: Diagnosis not present

## 2018-02-01 DIAGNOSIS — L03811 Cellulitis of head [any part, except face]: Secondary | ICD-10-CM | POA: Diagnosis not present

## 2018-02-01 DIAGNOSIS — L255 Unspecified contact dermatitis due to plants, except food: Secondary | ICD-10-CM | POA: Diagnosis not present

## 2018-05-18 DIAGNOSIS — E273 Drug-induced adrenocortical insufficiency: Secondary | ICD-10-CM | POA: Diagnosis not present

## 2018-05-18 DIAGNOSIS — E039 Hypothyroidism, unspecified: Secondary | ICD-10-CM | POA: Diagnosis not present

## 2018-05-18 DIAGNOSIS — I10 Essential (primary) hypertension: Secondary | ICD-10-CM | POA: Diagnosis not present

## 2018-05-18 DIAGNOSIS — R739 Hyperglycemia, unspecified: Secondary | ICD-10-CM | POA: Diagnosis not present

## 2018-05-18 DIAGNOSIS — R630 Anorexia: Secondary | ICD-10-CM | POA: Diagnosis not present

## 2018-05-18 DIAGNOSIS — Z23 Encounter for immunization: Secondary | ICD-10-CM | POA: Diagnosis not present

## 2018-05-20 IMAGING — DX DG CHEST 2V
2 series · 2 of 2 positions shown · non-contrast
Comparison: 04/16/2017

CLINICAL DATA: Chest pain right-sided.  Fall

EXAM:
CHEST  2 VIEW

[chest pa]
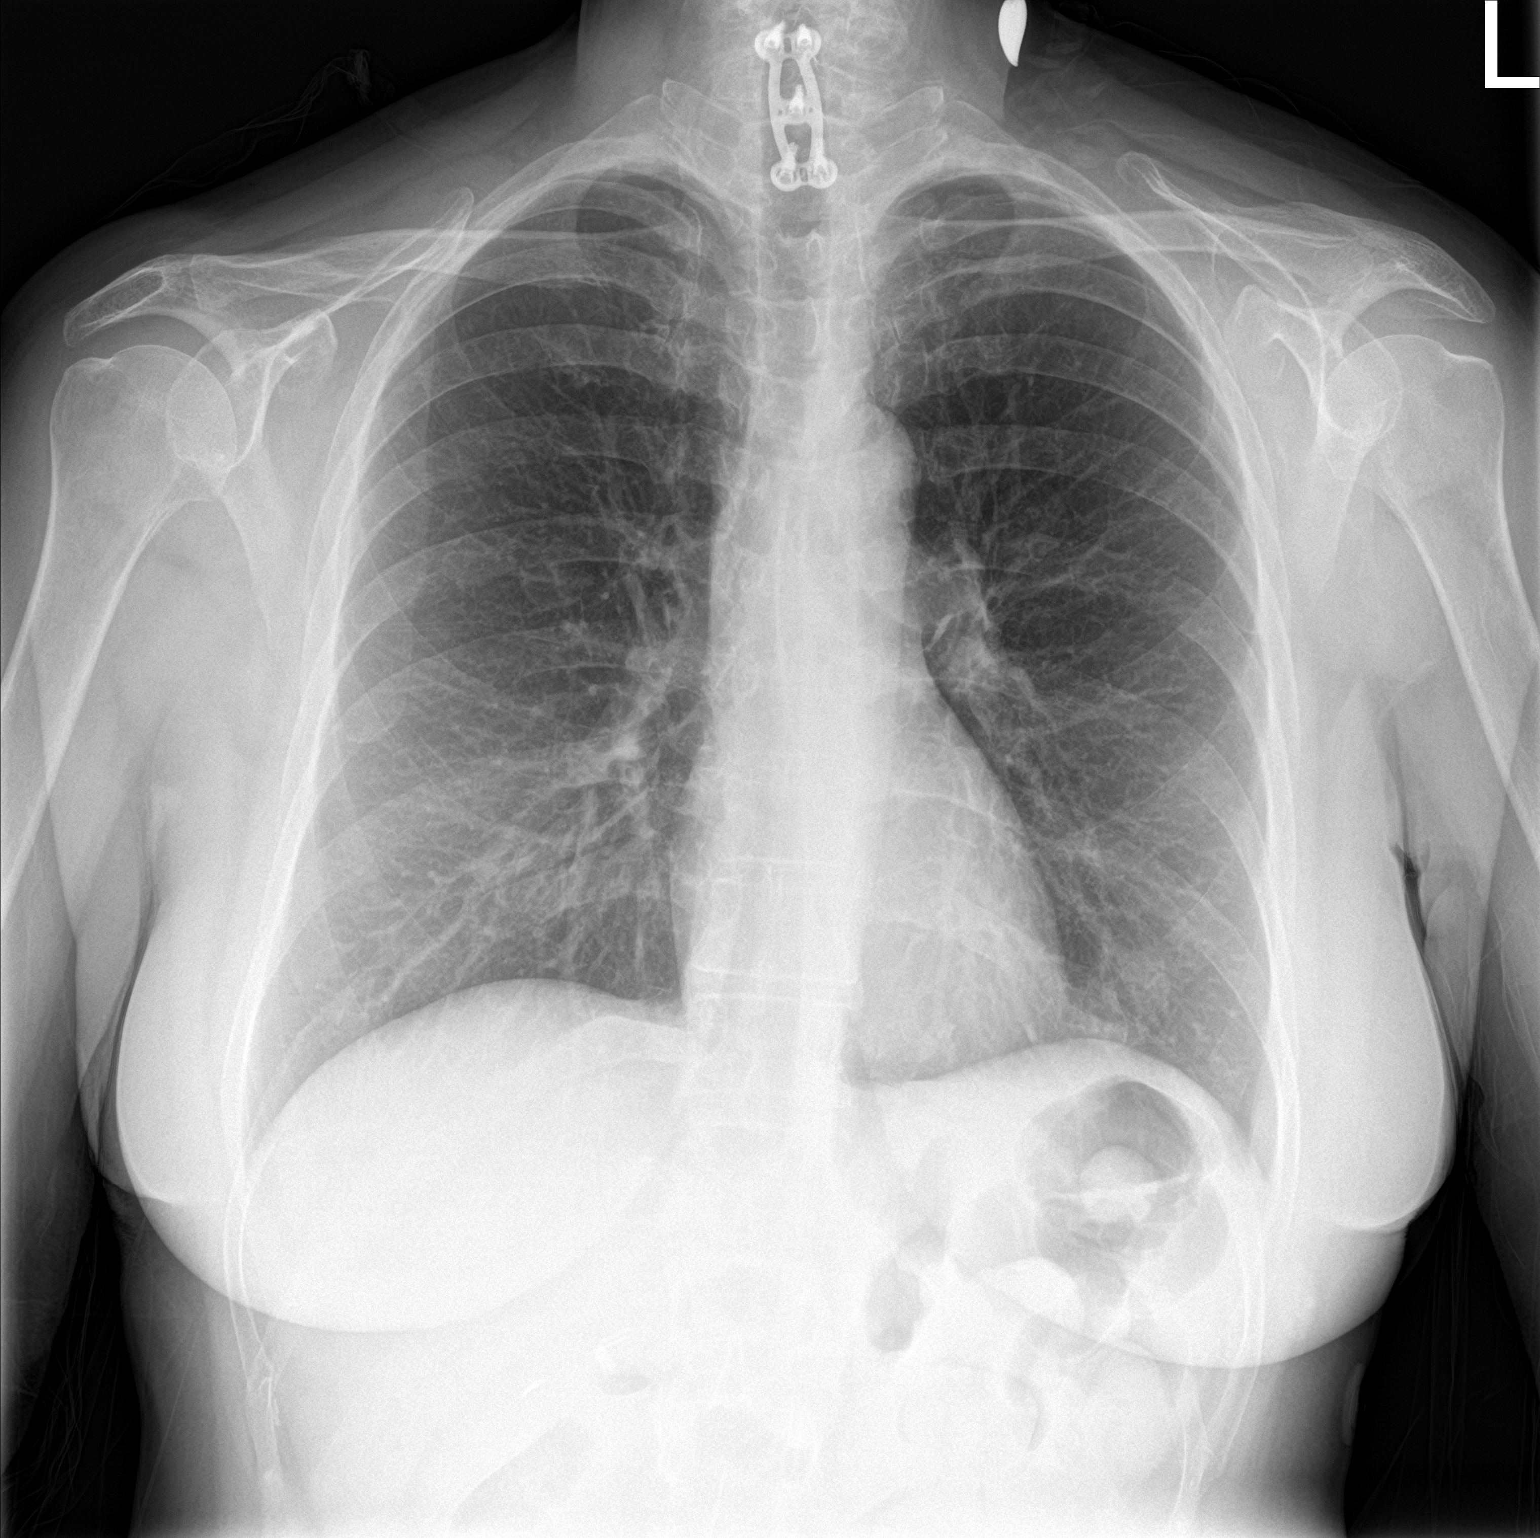

[chest lat]
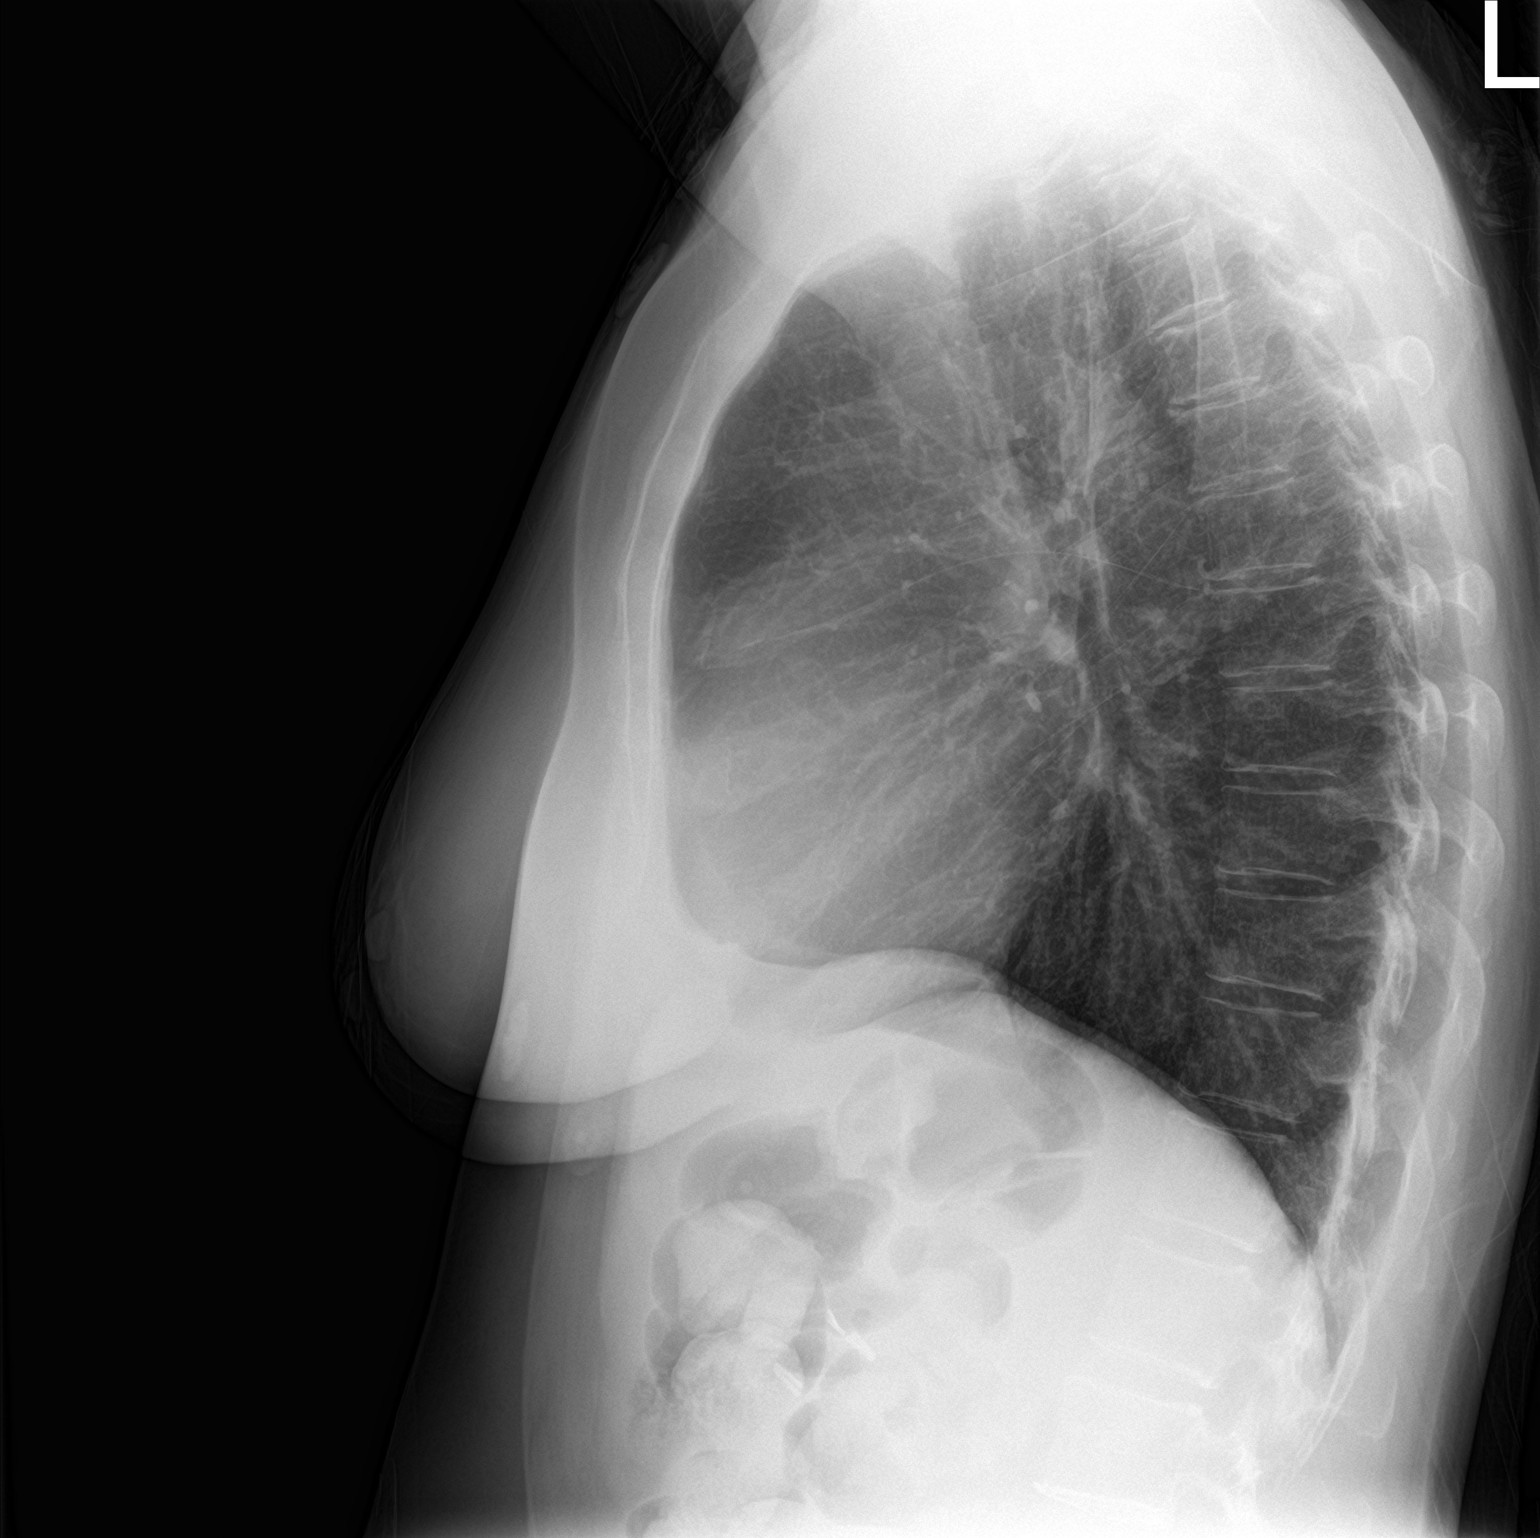

[2 of 2 positions shown; findings below may reference images not displayed]

FINDINGS: The heart size and mediastinal contours are within normal limits.
Both lungs are clear. The visualized skeletal structures are
unremarkable.
IMPRESSION: No active cardiopulmonary disease.

## 2018-07-20 ENCOUNTER — Other Ambulatory Visit (HOSPITAL_COMMUNITY): Payer: Self-pay | Admitting: Internal Medicine

## 2018-07-20 ENCOUNTER — Ambulatory Visit (HOSPITAL_COMMUNITY)
Admission: RE | Admit: 2018-07-20 | Discharge: 2018-07-20 | Disposition: A | Discharge status: Home / Self Care | Payer: Medicare Other | Source: Ambulatory Visit | Attending: Internal Medicine | Admitting: Internal Medicine

## 2018-07-20 DIAGNOSIS — Z1389 Encounter for screening for other disorder: Secondary | ICD-10-CM | POA: Diagnosis not present

## 2018-07-20 DIAGNOSIS — M25561 Pain in right knee: Secondary | ICD-10-CM

## 2018-07-20 DIAGNOSIS — Z6828 Body mass index (BMI) 28.0-28.9, adult: Secondary | ICD-10-CM | POA: Diagnosis not present

## 2018-07-20 DIAGNOSIS — G894 Chronic pain syndrome: Secondary | ICD-10-CM | POA: Diagnosis not present

## 2018-07-20 DIAGNOSIS — G4700 Insomnia, unspecified: Secondary | ICD-10-CM | POA: Diagnosis not present

## 2018-07-20 DIAGNOSIS — M1991 Primary osteoarthritis, unspecified site: Secondary | ICD-10-CM | POA: Diagnosis not present

## 2018-07-20 DIAGNOSIS — F419 Anxiety disorder, unspecified: Secondary | ICD-10-CM | POA: Diagnosis not present

## 2018-07-20 DIAGNOSIS — I10 Essential (primary) hypertension: Secondary | ICD-10-CM | POA: Diagnosis not present

## 2018-07-22 IMAGING — DX DG CHEST 2V
2 series · 2 of 2 positions shown · non-contrast
Comparison: Radiographs September 08, 2017.

CLINICAL DATA: Productive cough.

EXAM:
CHEST - 2 VIEW

[chest pa]
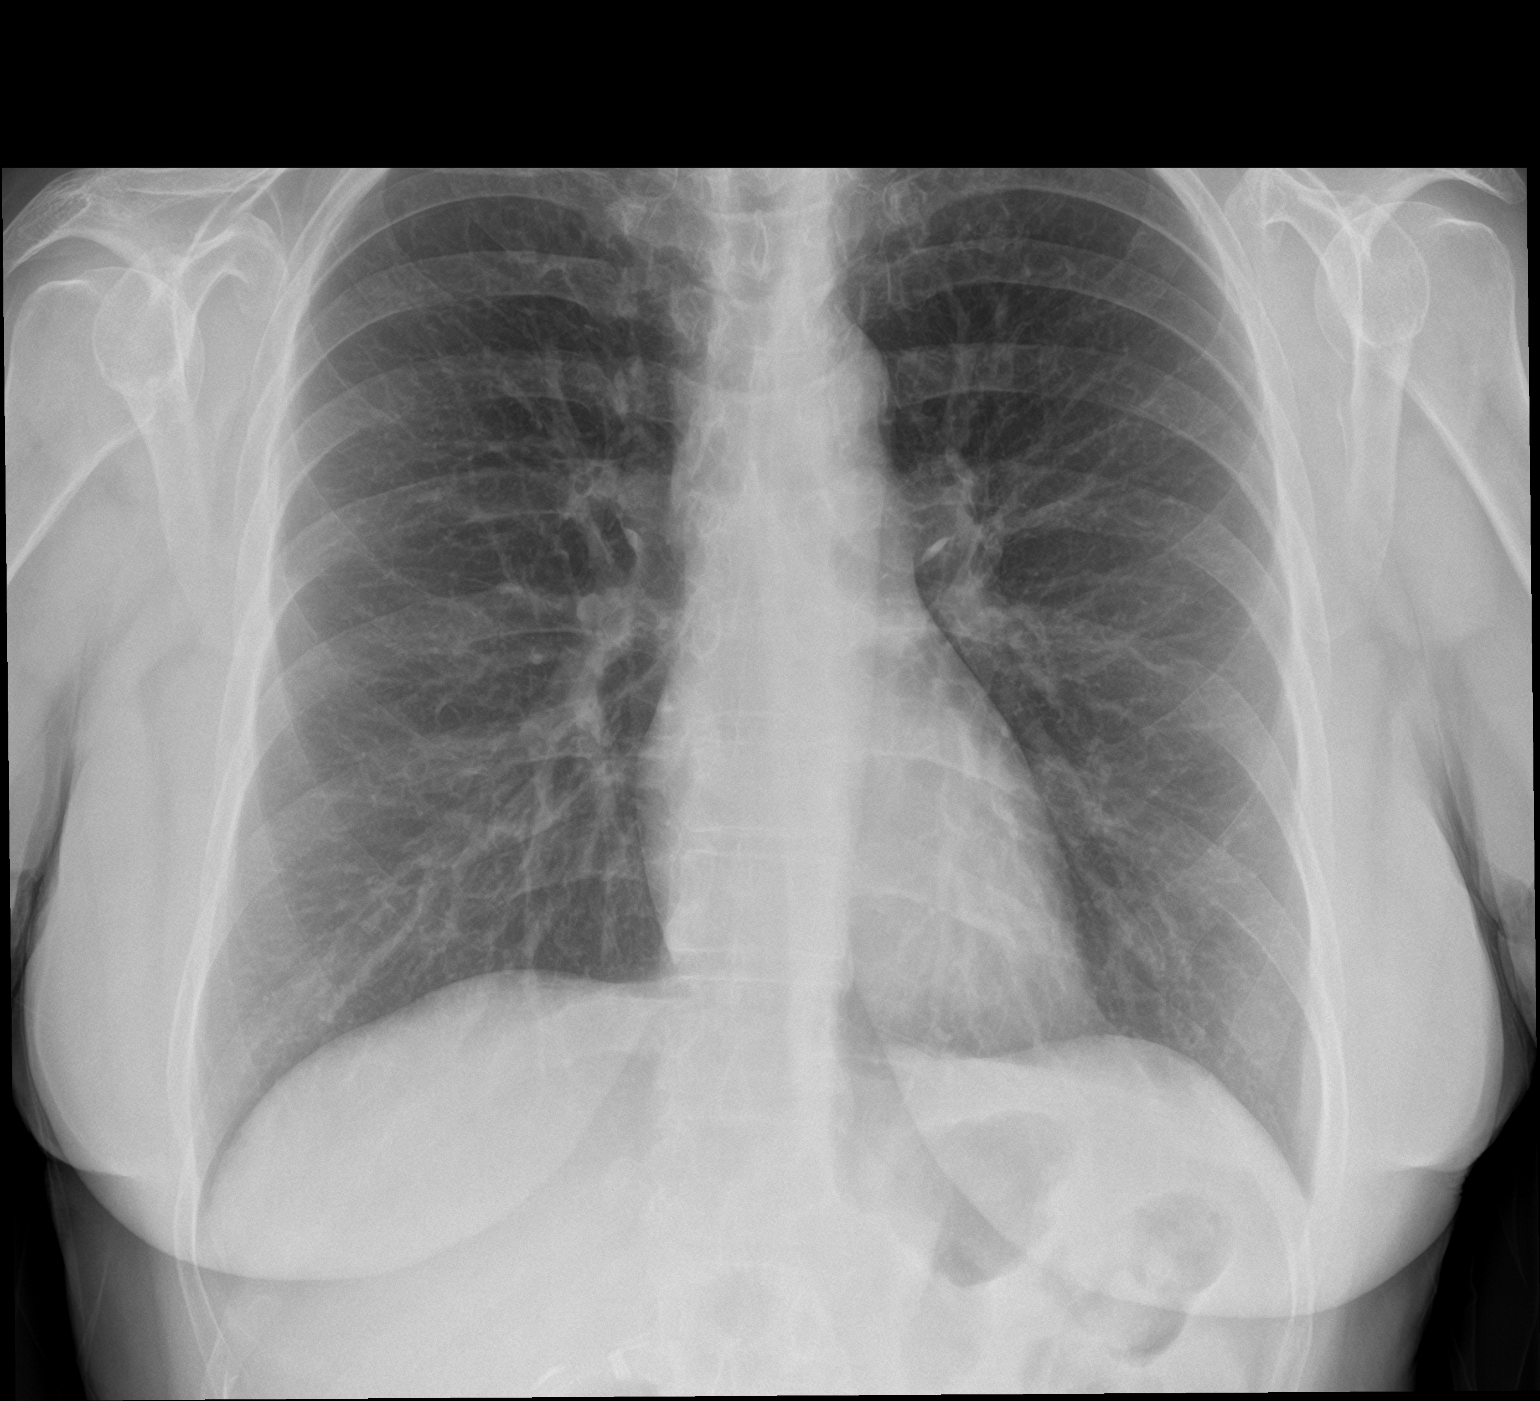

[chest lat]
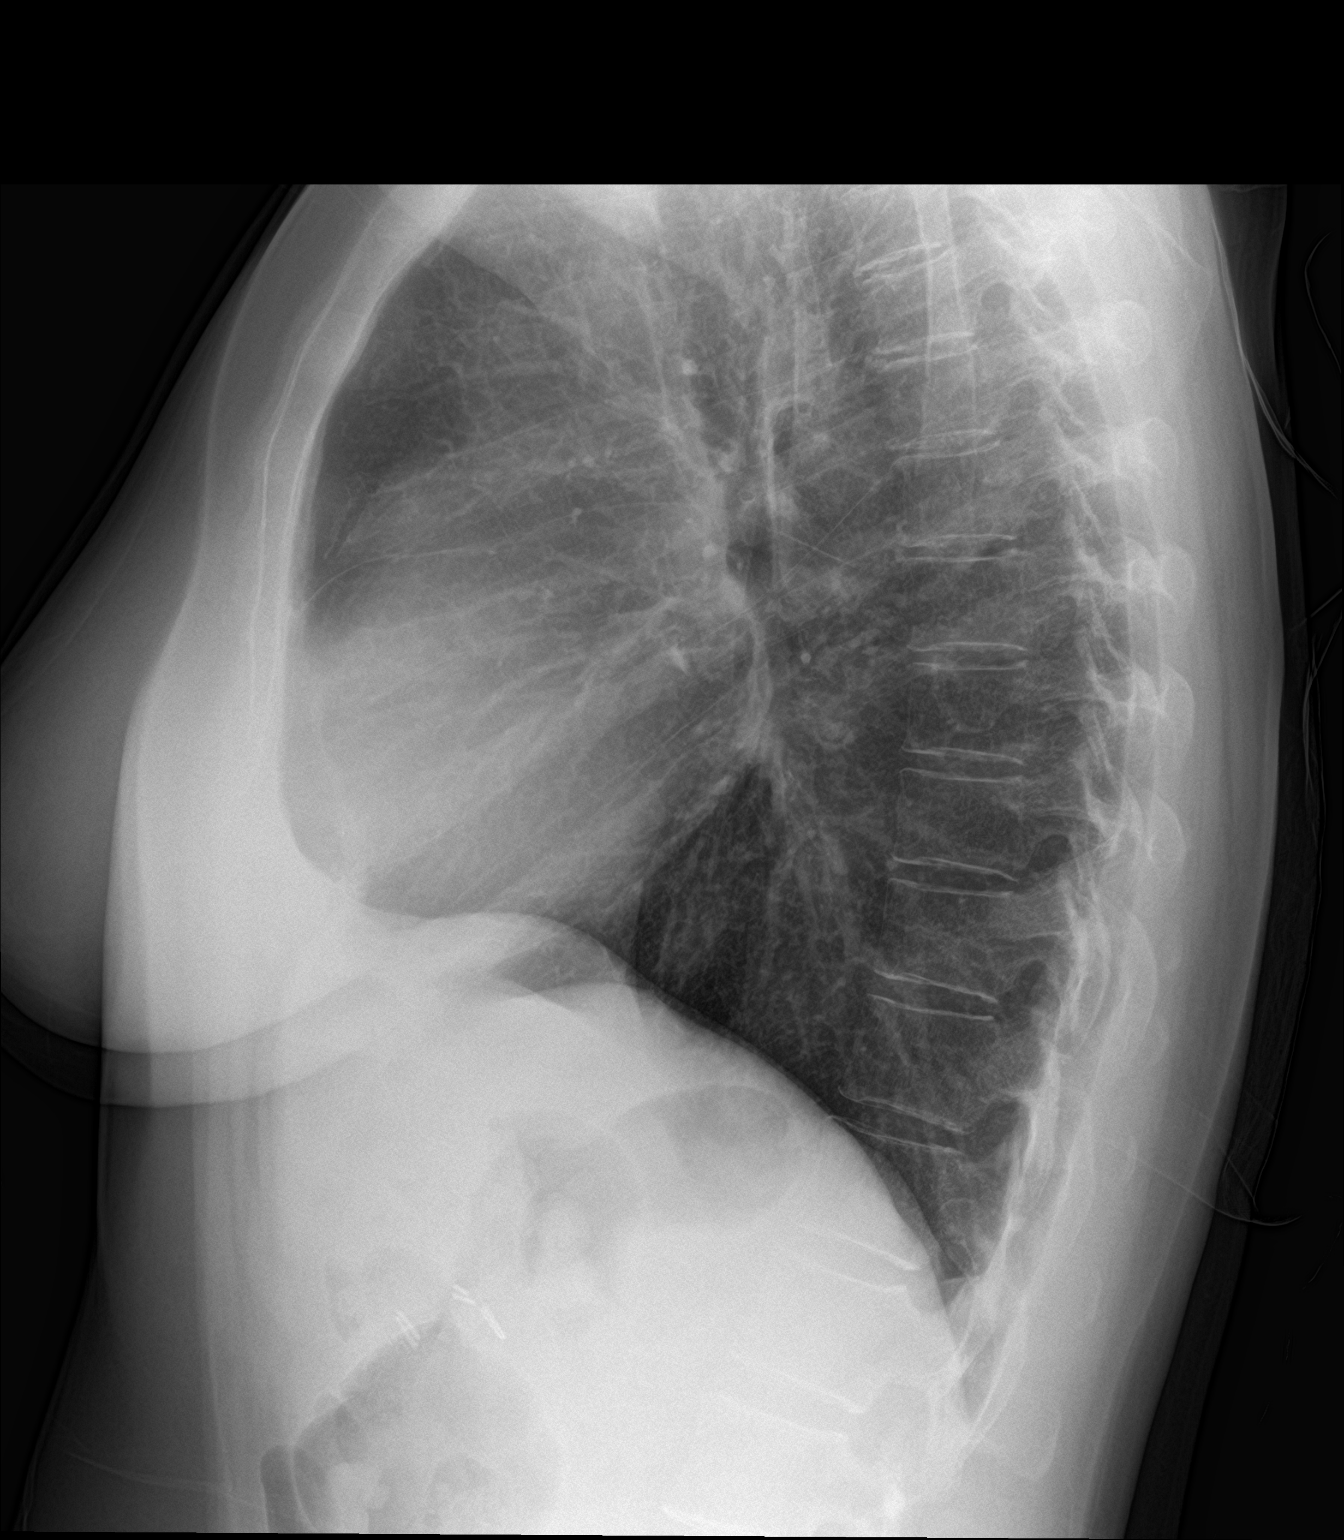

[2 of 2 positions shown; findings below may reference images not displayed]

FINDINGS: The heart size and mediastinal contours are within normal limits.
Both lungs are clear. No pneumothorax or pleural effusion is noted.
The visualized skeletal structures are unremarkable.
IMPRESSION: No active cardiopulmonary disease.

## 2018-08-28 DIAGNOSIS — M25552 Pain in left hip: Secondary | ICD-10-CM | POA: Diagnosis not present

## 2018-08-28 DIAGNOSIS — M5432 Sciatica, left side: Secondary | ICD-10-CM | POA: Diagnosis not present

## 2018-08-28 DIAGNOSIS — M25551 Pain in right hip: Secondary | ICD-10-CM | POA: Diagnosis not present

## 2018-08-28 DIAGNOSIS — M25572 Pain in left ankle and joints of left foot: Secondary | ICD-10-CM | POA: Diagnosis not present

## 2018-08-28 DIAGNOSIS — K029 Dental caries, unspecified: Secondary | ICD-10-CM | POA: Diagnosis not present

## 2018-08-28 DIAGNOSIS — F17200 Nicotine dependence, unspecified, uncomplicated: Secondary | ICD-10-CM | POA: Diagnosis not present

## 2018-10-05 DIAGNOSIS — R51 Headache: Secondary | ICD-10-CM | POA: Diagnosis not present

## 2018-10-05 DIAGNOSIS — Z79899 Other long term (current) drug therapy: Secondary | ICD-10-CM | POA: Diagnosis not present

## 2018-10-05 DIAGNOSIS — F17200 Nicotine dependence, unspecified, uncomplicated: Secondary | ICD-10-CM | POA: Diagnosis not present

## 2018-10-05 DIAGNOSIS — K219 Gastro-esophageal reflux disease without esophagitis: Secondary | ICD-10-CM | POA: Diagnosis not present

## 2018-10-05 DIAGNOSIS — R1030 Lower abdominal pain, unspecified: Secondary | ICD-10-CM | POA: Diagnosis not present

## 2018-10-05 DIAGNOSIS — R197 Diarrhea, unspecified: Secondary | ICD-10-CM | POA: Diagnosis not present

## 2018-10-05 DIAGNOSIS — R112 Nausea with vomiting, unspecified: Secondary | ICD-10-CM | POA: Diagnosis not present

## 2018-10-05 DIAGNOSIS — E860 Dehydration: Secondary | ICD-10-CM | POA: Diagnosis not present

## 2018-10-05 DIAGNOSIS — N3000 Acute cystitis without hematuria: Secondary | ICD-10-CM | POA: Diagnosis not present

## 2018-10-05 DIAGNOSIS — I700 Atherosclerosis of aorta: Secondary | ICD-10-CM | POA: Diagnosis not present

## 2018-10-11 DIAGNOSIS — M1991 Primary osteoarthritis, unspecified site: Secondary | ICD-10-CM | POA: Diagnosis not present

## 2018-10-11 DIAGNOSIS — G894 Chronic pain syndrome: Secondary | ICD-10-CM | POA: Diagnosis not present

## 2018-10-11 DIAGNOSIS — E063 Autoimmune thyroiditis: Secondary | ICD-10-CM | POA: Diagnosis not present

## 2018-10-11 DIAGNOSIS — Z6828 Body mass index (BMI) 28.0-28.9, adult: Secondary | ICD-10-CM | POA: Diagnosis not present

## 2018-10-11 DIAGNOSIS — Z1389 Encounter for screening for other disorder: Secondary | ICD-10-CM | POA: Diagnosis not present

## 2018-10-11 DIAGNOSIS — M5417 Radiculopathy, lumbosacral region: Secondary | ICD-10-CM | POA: Diagnosis not present

## 2018-10-11 DIAGNOSIS — I10 Essential (primary) hypertension: Secondary | ICD-10-CM | POA: Diagnosis not present

## 2018-10-11 DIAGNOSIS — E663 Overweight: Secondary | ICD-10-CM | POA: Diagnosis not present

## 2018-10-19 ENCOUNTER — Other Ambulatory Visit: Payer: Self-pay

## 2018-10-19 ENCOUNTER — Emergency Department (HOSPITAL_COMMUNITY): Payer: Medicare HMO

## 2018-10-19 ENCOUNTER — Emergency Department (HOSPITAL_COMMUNITY)
Admission: EM | Admit: 2018-10-19 | Discharge: 2018-10-19 | Disposition: A | Discharge status: Home / Self Care | Payer: Medicare HMO | Attending: Emergency Medicine | Admitting: Emergency Medicine

## 2018-10-19 ENCOUNTER — Encounter (HOSPITAL_COMMUNITY): Payer: Self-pay | Admitting: Emergency Medicine

## 2018-10-19 DIAGNOSIS — R109 Unspecified abdominal pain: Secondary | ICD-10-CM | POA: Diagnosis not present

## 2018-10-19 DIAGNOSIS — Z79899 Other long term (current) drug therapy: Secondary | ICD-10-CM | POA: Insufficient documentation

## 2018-10-19 DIAGNOSIS — I509 Heart failure, unspecified: Secondary | ICD-10-CM | POA: Diagnosis not present

## 2018-10-19 DIAGNOSIS — Z7982 Long term (current) use of aspirin: Secondary | ICD-10-CM | POA: Diagnosis not present

## 2018-10-19 DIAGNOSIS — E039 Hypothyroidism, unspecified: Secondary | ICD-10-CM | POA: Diagnosis not present

## 2018-10-19 DIAGNOSIS — R300 Dysuria: Secondary | ICD-10-CM

## 2018-10-19 DIAGNOSIS — F17210 Nicotine dependence, cigarettes, uncomplicated: Secondary | ICD-10-CM | POA: Diagnosis not present

## 2018-10-19 DIAGNOSIS — K5900 Constipation, unspecified: Secondary | ICD-10-CM | POA: Insufficient documentation

## 2018-10-19 DIAGNOSIS — I2510 Atherosclerotic heart disease of native coronary artery without angina pectoris: Secondary | ICD-10-CM | POA: Diagnosis not present

## 2018-10-19 LAB — URINALYSIS, ROUTINE W REFLEX MICROSCOPIC
BILIRUBIN URINE: NEGATIVE
Glucose, UA: NEGATIVE mg/dL
Ketones, ur: NEGATIVE mg/dL
Nitrite: NEGATIVE
PROTEIN: NEGATIVE mg/dL
SPECIFIC GRAVITY, URINE: 1.01 (ref 1.005–1.030)
pH: 5 (ref 5.0–8.0)

## 2018-10-19 LAB — CBC
HEMATOCRIT: 42.2 % (ref 36.0–46.0)
HEMOGLOBIN: 13.7 g/dL (ref 12.0–15.0)
MCH: 28 pg (ref 26.0–34.0)
MCHC: 32.5 g/dL (ref 30.0–36.0)
MCV: 86.1 fL (ref 80.0–100.0)
NRBC: 0 % (ref 0.0–0.2)
Platelets: 492 10*3/uL — ABNORMAL HIGH (ref 150–400)
RBC: 4.9 MIL/uL (ref 3.87–5.11)
RDW: 14 % (ref 11.5–15.5)
WBC: 11.3 10*3/uL — AB (ref 4.0–10.5)

## 2018-10-19 LAB — BASIC METABOLIC PANEL
ANION GAP: 10 (ref 5–15)
BUN: 20 mg/dL (ref 6–20)
CALCIUM: 9 mg/dL (ref 8.9–10.3)
CO2: 26 mmol/L (ref 22–32)
Chloride: 96 mmol/L — ABNORMAL LOW (ref 98–111)
Creatinine, Ser: 1.12 mg/dL — ABNORMAL HIGH (ref 0.44–1.00)
GFR calc non Af Amer: 56 mL/min — ABNORMAL LOW (ref >60)
Glucose, Bld: 99 mg/dL (ref 70–99)
Potassium: 3.6 mmol/L (ref 3.5–5.1)
SODIUM: 132 mmol/L — AB (ref 135–145)

## 2018-10-19 MED ORDER — PHENAZOPYRIDINE HCL 200 MG PO TABS: 200.0000 mg | ORAL_TABLET | Freq: Three times a day (TID) | ORAL | 0 refills | Status: DC | PRN

## 2018-10-19 MED ORDER — ACETAMINOPHEN 500 MG PO TABS
1000.0000 mg | ORAL_TABLET | Freq: Once | ORAL | Status: AC
  Administered 2018-10-19: 1000 mg via ORAL
  Filled 2018-10-19: qty 2

## 2018-10-19 MED ORDER — DICYCLOMINE HCL 20 MG PO TABS: 20.0000 mg | ORAL_TABLET | Freq: Two times a day (BID) | ORAL | 0 refills | Status: DC | PRN

## 2018-10-19 MED ORDER — POLYETHYLENE GLYCOL 3350 17 G PO PACK: 17.0000 g | PACK | Freq: Every day | ORAL | 0 refills | Status: DC | PRN

## 2018-10-19 MED ORDER — IOHEXOL 300 MG/ML  SOLN
100.0000 mL | Freq: Once | INTRAMUSCULAR | Status: AC | PRN
  Administered 2018-10-19: 100 mL via INTRAVENOUS

## 2018-10-19 MED ORDER — KETOROLAC TROMETHAMINE 60 MG/2ML IM SOLN: 60.0000 mg | Freq: Once | INTRAMUSCULAR | Status: DC

## 2018-10-19 NOTE — ED Triage Notes (Signed)
Patient reports inability to void since yesterday at 0700. C/O bilateral flank pain. Patient c/o nausea and vomiting, peripheral edema, and headache.

## 2018-10-19 NOTE — ED Notes (Signed)
Attempted IV X 2 unsuccessful.

## 2018-10-19 NOTE — ED Notes (Signed)
Iv attempt x 1 right forearm unsuccessful, unable to obtain urine for culture

## 2018-10-19 NOTE — ED Provider Notes (Signed)
[facility] EMERGENCY DEPARTMENT Provider Note   CSN: [id number] Arrival date & time: 10/19/18  1503     History   Chief Complaint Chief Complaint  Patient presents with  . Urinary Retention    HPI [name] is a 53 y.o. female.  HPI Patient states she was diagnosed with a UTI 2 weeks ago.  Was given a course of antibiotics and then switched over to Cipro last week.  That she is finished the antibiotics.  She is still having some difficulty urinating and pain with urination.  Admits to subjective fevers and chills.  She also is complaining of low back pain mostly on the right side.  Patient admits to nausea and generalized headache.  Headache is gradual in onset.  No neck pain or stiffness. Past Medical History:  Diagnosis Date  . Addison's disease ([place])   . Adrenal insufficiency ([place])    diagnosed 2012  . Aneurysm ([place])   . Anxiety   . Astigmatism   . CAD (coronary artery disease)    Cath 2008 EF normal. RCA 50-60, Septal 50%. Myoview 3/12: EF 53% normal perfusion  . Cardiac arrest ([place])    2/2 adissonian crisis  . Cardiomyopathy    resolved  . Chest pain    chronicc  . CHF (congestive heart failure) ([place])   . Chronic back pain   . Chronic diarrhea   . Concussion    sept 28th 2014  . Gastroparesis   . HTN (hypertension)   . Hyperlipidemia   . Hypothyroidism   . Mitral valve prolapse   . Nondiabetic gastroparesis   . QT prolongation   . Tobacco abuse    down to 2 cigarettes per day  . Vertigo     Patient Active Problem List   Diagnosis Date Noted  . Myositis 05/11/2016  . Anxiety 05/26/2015  . Tremors of nervous system 05/26/2015  . Neck pain 05/26/2015  . Hot flushes, perimenopausal 07/24/2013  . CAD (coronary artery disease) 04/11/2012  . GERD (gastroesophageal reflux disease) 04/11/2012  . Addison's disease ([place]) 03/03/2012  . QT prolongation 12/15/2010  . Palpitations 01/01/2009  . Hypothyroidism 12/31/2008  . Hyperlipidemia 12/31/2008  .  OVERWEIGHT/OBESITY 12/31/2008  . Essential hypertension 12/31/2008    Past Surgical History:  Procedure Laterality Date  . ABDOMINAL HYSTERECTOMY    . CARDIAC CATHETERIZATION  03/2007   showed 60% lesion in the right coronary artery  . CHOLECYSTECTOMY    . LEFT HEART CATHETERIZATION WITH CORONARY ANGIOGRAM N/A 04/13/2012   Procedure: LEFT HEART CATHETERIZATION WITH CORONARY ANGIOGRAM;  Surgeon: Hillary Bow, MD;  Location: [facility] CATH LAB;  Service: Cardiovascular;  Laterality: N/A;  . SPINE SURGERY    . VARICOSE VEIN SURGERY    . VESICOVAGINAL FISTULA CLOSURE W/ TAH       OB History    Gravida  3   Para  2   Term  2   Preterm  0   AB  1   Living  2     SAB  1   TAB      Ectopic      Multiple      Live Births               Home Medications    Prior to Admission medications   Medication Sig Start Date End Date Taking? Authorizing Provider  amLODipine (NORVASC) 10 MG tablet Take 1 tablet by mouth daily. 04/05/17  Yes [provider]  aspirin 81 MG  chewable tablet Chew 81 mg by mouth daily.    Yes [provider]  atorvastatin (LIPITOR) 20 MG tablet Take 20 mg by mouth every morning.    Yes [provider]  cholecalciferol (VITAMIN D) 1000 UNITS tablet Take 1,000 Units by mouth daily.   Yes [provider]  cyclobenzaprine (FLEXERIL) 10 MG tablet Take 10 mg by mouth 3 (three) times daily as needed. 10/11/18  Yes [provider]  gabapentin (NEURONTIN) 300 MG capsule Take 1 capsule by mouth 3 (three) times daily. 10/11/18  Yes [provider]  hydrochlorothiazide (HYDRODIURIL) 12.5 MG tablet Take 6.25 mg by mouth daily as needed (for High Blood Pressure >145/98). Second line medication.   Yes [provider]  hydrocortisone (CORTEF) 10 MG tablet Take 10-20 mg by mouth 2 (two) times daily. Takes 20 mg in the morning and 10 mg at night 04/07/16  Yes [provider]  levothyroxine (SYNTHROID,  LEVOTHROID) 75 MCG tablet Take 75 mcg by mouth daily before breakfast.   Yes [provider]  meclizine (ANTIVERT) 25 MG tablet 1 or 2 tabs PO q8h prn dizziness Patient taking differently: Take 25-50 mg by mouth 3 (three) times daily as needed (for vertigo).  04/16/16  Yes Francine Graven, DO  metoCLOPramide (REGLAN) 10 MG tablet Take 10 mg by mouth 4 (four) times daily - after meals and at bedtime. 07/11/17  Yes [provider]  nitroGLYCERIN (NITROSTAT) 0.4 MG SL tablet Place 0.4 mg under the tongue every 5 (five) minutes as needed for chest pain.   Yes [provider]  ondansetron (ZOFRAN ODT) 4 MG disintegrating tablet Take 1 tablet (4 mg total) by mouth every 8 (eight) hours as needed for nausea. 12/11/17  Yes Noemi Chapel, MD  oxybutynin (DITROPAN) 5 MG tablet Take 5 mg by mouth 2 (two) times daily as needed. 10/11/18  Yes [provider]  oxyCODONE (ROXICODONE) 15 MG immediate release tablet Take 15 mg by mouth every 4 (four) hours as needed for pain.    Yes [provider]  pantoprazole (PROTONIX) 20 MG tablet Take 1 tablet (20 mg total) by mouth daily. Patient taking differently: Take 20 mg by mouth daily as needed for indigestion.  02/04/16  Yes Ward, Ozella Almond, PA-C  vitamin C (ASCORBIC ACID) 500 MG tablet Take 500 mg by mouth daily.   Yes [provider]  zolpidem (AMBIEN) 10 MG tablet Take 10 mg by mouth at bedtime. 05/06/16  Yes [provider]  dicyclomine (BENTYL) 20 MG tablet Take 1 tablet (20 mg total) by mouth 2 (two) times daily as needed for spasms. 10/19/18   Julianne Rice, MD  furosemide (LASIX) 20 MG tablet Take 20 mg by mouth daily as needed for fluid.     [provider]  LORazepam (ATIVAN) 2 MG tablet Take 2 mg by mouth every 8 (eight) hours as needed for anxiety.  05/06/16   [provider]  oxyCODONE-acetaminophen (PERCOCET) 5-325 MG tablet Take 1 tablet by mouth every 4 (four) hours as  needed. Patient not taking: Reported on 09/08/2017 04/16/17   Larene Pickett, PA-C  phenazopyridine (PYRIDIUM) 200 MG tablet Take 1 tablet (200 mg total) by mouth 3 (three) times daily as needed for pain. 10/19/18   Julianne Rice, MD  polyethylene glycol [facility] / Floria Raveling) packet Take 17 g by mouth daily as needed for moderate constipation. 10/19/18   Julianne Rice, MD  promethazine (PHENERGAN) 25 MG suppository Place 1 suppository (25 mg total)  rectally every 6 (six) hours as needed for nausea or vomiting. 12/11/17   Noemi Chapel, MD    Family History Family History  Problem Relation Age of Onset  . Cancer Father        Colon  . Coronary artery disease Other   . Heart attack Mother     Social History Social History   Tobacco Use  . Smoking status: Current Every Day Smoker    Packs/day: 0.25    Years: 10.00    Pack years: 2.50    Types: Cigarettes  . Smokeless tobacco: Never Used  . Tobacco comment: smokes 2 a day  Substance Use Topics  . Alcohol use: No  . Drug use: No    Comment: Hisrory of Cocaine use      Allergies   Bee venom; Doxycycline; Erythromycin; Ibuprofen; Morphine and related; Penicillins; Sulfa antibiotics; Cephalexin; Dust mite extract; Tramadol; Other; and Sulfonamide derivatives   Review of Systems Review of Systems  Constitutional: Positive for fatigue and fever. Negative for chills.  HENT: Negative for sore throat and trouble swallowing.   Respiratory: Negative for cough and shortness of breath.   Cardiovascular: Positive for leg swelling. Negative for chest pain.  Gastrointestinal: Positive for abdominal pain, nausea and vomiting. Negative for constipation and diarrhea.  Genitourinary: Positive for difficulty urinating, dysuria and flank pain. Negative for frequency, hematuria, vaginal bleeding and vaginal discharge.  Musculoskeletal: Positive for back pain and myalgias. Negative for neck pain.  Skin: Negative for rash and wound.  Neurological:  Negative for dizziness, weakness, light-headedness, numbness and headaches.  All other systems reviewed and are negative.    Physical Exam Updated Vital Signs BP 135/79 (BP Location: Right Arm)   Pulse 92   Temp 98 F (36.7 C) (Oral)   Resp 18   Ht 5' 5.5" (1.664 m)   Wt 81.6 kg   SpO2 94%   BMI 29.50 kg/m   Physical Exam Vitals signs and nursing note reviewed.  Constitutional:      Appearance: Normal appearance. She is well-developed.  HENT:     Head: Normocephalic and atraumatic.     Nose: Nose normal. No congestion.     Mouth/Throat:     Mouth: Mucous membranes are moist.  Eyes:     Extraocular Movements: Extraocular movements intact.     Conjunctiva/sclera: Conjunctivae normal.     Pupils: Pupils are equal, round, and reactive to light.  Neck:     Musculoskeletal: Normal range of motion and neck supple. No neck rigidity or muscular tenderness.     Comments: No meningismus Cardiovascular:     Rate and Rhythm: Normal rate and regular rhythm.     Heart sounds: No murmur. No friction rub. No gallop.   Pulmonary:     Effort: Pulmonary effort is normal. No respiratory distress.     Breath sounds: Normal breath sounds. No stridor. No wheezing, rhonchi or rales.  Abdominal:     General: Bowel sounds are normal.     Palpations: Abdomen is soft.     Tenderness: There is abdominal tenderness. There is no right CVA tenderness, left CVA tenderness, guarding or rebound.     Comments: Mild suprapubic discomfort to palpation.  Musculoskeletal: Normal range of motion.        General: No tenderness.     Comments: No definite CVA tenderness bilaterally.  Patient has right lower lumbar paraspinal tenderness to palpation.  1+ bilateral lower extremity pitting edema.  No calf tenderness or asymmetry.  Distal pulses intact.  Lymphadenopathy:     Cervical: No cervical adenopathy.  Skin:    General: Skin is warm and dry.     Findings: No erythema or rash.  Neurological:     General:  No focal deficit present.     Mental Status: She is alert and oriented to person, place, and time.     Comments: Sensation intact.  No saddle anesthesia.  Psychiatric:        Mood and Affect: Mood normal.        Behavior: Behavior normal.      ED Treatments / Results  Labs (all labs ordered are listed, but only abnormal results are displayed) Labs Reviewed  URINALYSIS, ROUTINE W REFLEX MICROSCOPIC - Abnormal; Notable for the following components:      Result Value   APPearance HAZY (*)    Hgb urine dipstick SMALL (*)    Leukocytes,Ua SMALL (*)    Bacteria, UA FEW (*)    All other components within normal limits  CBC - Abnormal; Notable for the following components:   WBC 11.3 (*)    Platelets 492 (*)    All other components within normal limits  BASIC METABOLIC PANEL - Abnormal; Notable for the following components:   Sodium 132 (*)    Chloride 96 (*)    Creatinine, Ser 1.12 (*)    GFR calc non Af Amer 56 (*)    All other components within normal limits  URINE CULTURE    EKG None  Radiology Ct Abdomen Pelvis Wo Contrast  Result Date: 10/19/2018 CLINICAL DATA:  Difficulty voiding since yesterday. Bilateral flank pain. EXAM: CT ABDOMEN AND PELVIS WITHOUT CONTRAST TECHNIQUE: Multidetector CT imaging of the abdomen and pelvis was performed following the standard protocol without IV contrast. COMPARISON:  10/05/2018 FINDINGS: Lower chest: Dependent bibasilar atelectasis. Probable mild pleural thickening at each lung base, series 3/16 Hepatobiliary: No focal liver abnormality is seen. Status post cholecystectomy. Stable dilatation of the common bowel duct as it courses through the pancreatic head doubt choledocholithiasis. This measures up to 7 mm. Pancreas: No acute inflammation or mass. No pancreatic ductal dilatation. Spleen: Normal in size without focal abnormality. Adrenals/Urinary Tract: Adrenal glands are unremarkable. Kidneys are normal, without renal calculi, focal lesion,  or hydronephrosis. Bladder is unremarkable. Stomach/Bowel: Significant stool retention throughout the colon consistent constipation. No small bowel dilatation or obstruction. No inflammation. The stomach is decompressed in appearance. Vascular/Lymphatic: Aortic atherosclerosis. No enlarged abdominal or pelvic lymph nodes. Reproductive: Status post hysterectomy. No adnexal masses. Other: No abdominal wall hernia or abnormality. No abdominopelvic ascites. Musculoskeletal: Lumbar spondylosis with facet arthrosis. No acute fracture. IMPRESSION: 1. Significant stool retention throughout the colon consistent with constipation. No bowel obstruction or inflammation. 2. No nephrolithiasis or obstructive uropathy. Aortic Atherosclerosis (ICD10-I70.0). Electronically Signed   By: Ashley Royalty M.D.   On: 10/19/2018 21:17    Procedures Procedures (including critical care time)  Medications Ordered in ED Medications  acetaminophen (TYLENOL) tablet 1,000 mg (1,000 mg Oral Given 10/19/18 1717)  iohexol (OMNIPAQUE) 300 MG/ML solution 100 mL (100 mLs Intravenous Contrast Given 10/19/18 2035)     Initial Impression / Assessment and Plan / ED Course  I have reviewed the triage vital signs and the nursing notes.  Pertinent labs & imaging results that were available during my care of the patient were reviewed by me and considered in my medical decision making (see chart for details).     Patient is chronic narcotic use.  No  definite signs of UTI.  Mild elevation in white blood cell count but this appears to be chronic.  CT abdomen pelvis with evidence of constipation.  Difficulty urinating and constipation likely due to narcotic use.  Low suspicion for cauda equina.  Will start on MiraLAX and Bentyl.  Urine was sent for culture.  Advised to follow-up closely with her primary physician and return precautions given.  Final Clinical Impressions(s) / ED Diagnoses   Final diagnoses:  Constipation, unspecified  constipation type  Dysuria    ED Discharge Orders         Ordered    polyethylene glycol (MIRALAX / GLYCOLAX) packet  Daily PRN     10/19/18 2125    dicyclomine (BENTYL) 20 MG tablet  2 times daily PRN     10/19/18 2125    phenazopyridine (PYRIDIUM) 200 MG tablet  3 times daily PRN     10/19/18 2125           Julianne Rice, MD 10/19/18 2150

## 2018-10-21 LAB — URINE CULTURE: CULTURE: NO GROWTH

## 2018-10-26 ENCOUNTER — Other Ambulatory Visit (HOSPITAL_COMMUNITY): Payer: Self-pay | Admitting: Internal Medicine

## 2018-10-26 ENCOUNTER — Other Ambulatory Visit: Payer: Self-pay | Admitting: Internal Medicine

## 2018-10-26 DIAGNOSIS — M5417 Radiculopathy, lumbosacral region: Secondary | ICD-10-CM

## 2018-10-27 ENCOUNTER — Other Ambulatory Visit (HOSPITAL_COMMUNITY): Payer: Self-pay | Admitting: Internal Medicine

## 2018-10-27 ENCOUNTER — Other Ambulatory Visit: Payer: Self-pay | Admitting: Internal Medicine

## 2018-10-27 ENCOUNTER — Ambulatory Visit (HOSPITAL_COMMUNITY)
Admission: RE | Admit: 2018-10-27 | Discharge: 2018-10-27 | Disposition: A | Discharge status: Home / Self Care | Payer: Medicare HMO | Source: Ambulatory Visit | Attending: Internal Medicine | Admitting: Internal Medicine

## 2018-10-27 DIAGNOSIS — Z1389 Encounter for screening for other disorder: Secondary | ICD-10-CM | POA: Diagnosis not present

## 2018-10-27 DIAGNOSIS — M79605 Pain in left leg: Secondary | ICD-10-CM

## 2018-10-27 DIAGNOSIS — M1991 Primary osteoarthritis, unspecified site: Secondary | ICD-10-CM | POA: Diagnosis not present

## 2018-10-27 DIAGNOSIS — M7122 Synovial cyst of popliteal space [Baker], left knee: Secondary | ICD-10-CM | POA: Diagnosis not present

## 2018-10-27 DIAGNOSIS — G5702 Lesion of sciatic nerve, left lower limb: Secondary | ICD-10-CM | POA: Diagnosis not present

## 2018-10-27 DIAGNOSIS — G894 Chronic pain syndrome: Secondary | ICD-10-CM | POA: Diagnosis not present

## 2018-10-27 DIAGNOSIS — Z6830 Body mass index (BMI) 30.0-30.9, adult: Secondary | ICD-10-CM | POA: Diagnosis not present

## 2018-10-27 DIAGNOSIS — R600 Localized edema: Secondary | ICD-10-CM | POA: Diagnosis not present

## 2018-10-27 DIAGNOSIS — E273 Drug-induced adrenocortical insufficiency: Secondary | ICD-10-CM | POA: Diagnosis not present

## 2018-11-01 ENCOUNTER — Ambulatory Visit (HOSPITAL_COMMUNITY): Payer: Medicare HMO

## 2018-11-14 ENCOUNTER — Ambulatory Visit (HOSPITAL_COMMUNITY)
Admission: RE | Admit: 2018-11-14 | Discharge: 2018-11-14 | Disposition: A | Discharge status: Home / Self Care | Payer: Medicare HMO | Source: Ambulatory Visit | Attending: Internal Medicine | Admitting: Internal Medicine

## 2018-11-14 DIAGNOSIS — M545 Low back pain: Secondary | ICD-10-CM | POA: Diagnosis not present

## 2018-11-14 DIAGNOSIS — M5417 Radiculopathy, lumbosacral region: Secondary | ICD-10-CM | POA: Insufficient documentation

## 2018-11-21 DIAGNOSIS — F419 Anxiety disorder, unspecified: Secondary | ICD-10-CM | POA: Diagnosis not present

## 2018-11-21 DIAGNOSIS — Z0001 Encounter for general adult medical examination with abnormal findings: Secondary | ICD-10-CM | POA: Diagnosis not present

## 2018-11-21 DIAGNOSIS — I10 Essential (primary) hypertension: Secondary | ICD-10-CM | POA: Diagnosis not present

## 2018-11-21 DIAGNOSIS — M5417 Radiculopathy, lumbosacral region: Secondary | ICD-10-CM | POA: Diagnosis not present

## 2018-11-21 DIAGNOSIS — Z1389 Encounter for screening for other disorder: Secondary | ICD-10-CM | POA: Diagnosis not present

## 2018-11-21 DIAGNOSIS — Z6828 Body mass index (BMI) 28.0-28.9, adult: Secondary | ICD-10-CM | POA: Diagnosis not present

## 2018-11-21 DIAGNOSIS — G894 Chronic pain syndrome: Secondary | ICD-10-CM | POA: Diagnosis not present

## 2018-11-30 DIAGNOSIS — E663 Overweight: Secondary | ICD-10-CM | POA: Diagnosis not present

## 2018-11-30 DIAGNOSIS — Z0001 Encounter for general adult medical examination with abnormal findings: Secondary | ICD-10-CM | POA: Diagnosis not present

## 2018-11-30 DIAGNOSIS — Z1389 Encounter for screening for other disorder: Secondary | ICD-10-CM | POA: Diagnosis not present

## 2018-11-30 DIAGNOSIS — E7849 Other hyperlipidemia: Secondary | ICD-10-CM | POA: Diagnosis not present

## 2018-11-30 DIAGNOSIS — Z6828 Body mass index (BMI) 28.0-28.9, adult: Secondary | ICD-10-CM | POA: Diagnosis not present

## 2018-12-01 DIAGNOSIS — M7138 Other bursal cyst, other site: Secondary | ICD-10-CM | POA: Diagnosis not present

## 2018-12-01 DIAGNOSIS — M5136 Other intervertebral disc degeneration, lumbar region: Secondary | ICD-10-CM | POA: Diagnosis present

## 2018-12-01 DIAGNOSIS — M546 Pain in thoracic spine: Secondary | ICD-10-CM | POA: Diagnosis not present

## 2018-12-01 DIAGNOSIS — I10 Essential (primary) hypertension: Secondary | ICD-10-CM | POA: Diagnosis not present

## 2018-12-01 DIAGNOSIS — M549 Dorsalgia, unspecified: Secondary | ICD-10-CM | POA: Diagnosis not present

## 2018-12-01 DIAGNOSIS — M47816 Spondylosis without myelopathy or radiculopathy, lumbar region: Secondary | ICD-10-CM | POA: Diagnosis not present

## 2018-12-01 DIAGNOSIS — Z6828 Body mass index (BMI) 28.0-28.9, adult: Secondary | ICD-10-CM | POA: Diagnosis not present

## 2018-12-01 DIAGNOSIS — M48062 Spinal stenosis, lumbar region with neurogenic claudication: Secondary | ICD-10-CM | POA: Diagnosis not present

## 2018-12-04 ENCOUNTER — Other Ambulatory Visit: Payer: Self-pay | Admitting: Neurosurgery

## 2018-12-07 NOTE — Pre-Procedure Instructions (Signed)
JAMIRA BARFUSS  12/07/2018      [id number] - [place], [place] [address] Phone: [phone] Fax: [phone]    Your procedure is scheduled on Wednesday, 12/20/18.  Report to [facility] Admitting-Entrance A at 6:30 am.  Call this number if you have problems the morning of surgery:  [phone]   Remember:  Do not eat or drink after midnight Tuesday.    Take these medicines the morning of surgery with A SIP OF WATER:  amLODipine (NORVASC)     meclizine (ANTIVERT) if needed atorvastatin (LIPITOR)      nitroGLYCERIN (NITROSTAT) if needed gabapentin (NEURONTIN)    ondansetron (ZOFRAN ODT) if needed hydrocortisone (CORTEF)    oxyCODONE (ROXICODONE) if needed levothyroxine (SYNTHROID, LEVOTHROID)   pantoprazole (PROTONIX) if needed      7 days prior to surgery STOP taking any Aspirin (unless otherwise instructed by your surgeon), Aleve, Naproxen, Ibuprofen, Motrin, Advil, Goody's, BC's, all herbal medications, fish oil, and all vitamins.  Follow your surgeon's instructions on when to stop Asprin.  If no instructions were given by your surgeon then you will need to call the office to get those instructions.      Do not wear jewelry, make-up or nail polish.  Do not wear lotions, powders, or perfumes, or deodorant.  Do not shave 48 hours prior to surgery.    Do not bring valuables to the hospital.  [facility] is not responsible for any belongings or valuables.  Contacts, dentures or bridgework may not be worn into surgery.  Leave your suitcase in the car.  After surgery it may be brought to your room.  For patients admitted to the hospital, discharge time will be determined by your treatment team.  Patients discharged the day of surgery will not be allowed to drive home.    Special instructions: [facility]- Preparing For Surgery  Before surgery, you can play an important role. Because skin is not sterile,  your skin needs to be as free of germs as possible. You can reduce the number of germs on your skin by washing with CHG (chlorahexidine gluconate) Soap before surgery.  CHG is an antiseptic cleaner which kills germs and bonds with the skin to continue killing germs even after washing.    Oral Hygiene is also important to reduce your risk of infection.  Remember - BRUSH YOUR TEETH THE MORNING OF SURGERY WITH YOUR REGULAR TOOTHPASTE  Please do not use if you have an allergy to CHG or antibacterial soaps. If your skin becomes reddened/irritated stop using the CHG.  Do not shave (including legs and underarms) for at least 48 hours prior to first CHG shower. It is OK to shave your face.  Please follow these instructions carefully.   1. Shower the NIGHT BEFORE SURGERY (Tuesday) and the MORNING OF SURGERY (Wed.) with CHG.   2. If you chose to wash your hair, wash your hair first as usual with your normal shampoo.  3. After you shampoo, rinse your hair and body thoroughly to remove the shampoo.  4. Use CHG as you would any other liquid soap. You can apply CHG directly to the skin and wash gently with a scrungie or a clean washcloth.   5. Apply the CHG Soap to your body ONLY FROM THE NECK DOWN.  Do not use on open wounds or open sores. Avoid contact with your eyes, ears, mouth and genitals (private parts). Wash Face and  genitals (private parts)  with your normal soap.  6. Wash thoroughly, paying special attention to the area where your surgery will be performed.  7. Thoroughly rinse your body with warm water from the neck down.  8. DO NOT shower/wash with your normal soap after using and rinsing off the CHG Soap.  9. Pat yourself dry with a CLEAN TOWEL.  10. Wear CLEAN PAJAMAS to bed the night before surgery, wear comfortable clothes the morning of surgery  11. Place CLEAN SHEETS on your bed the night of your first shower and DO NOT SLEEP WITH PETS.  Day of Surgery:  Do not apply any  deodorants/lotions.  Please wear clean clothes to the hospital/surgery center.   Remember to brush your teeth WITH YOUR REGULAR TOOTHPASTE.   Please read over the following fact sheets that you were given.

## 2018-12-07 NOTE — Progress Notes (Addendum)
Anesthesia Chart Review:  Case:  [id number] Date/Time:  12/20/18 0815   Procedure:  Left Lumbar 3-4/4-5 Lumbar laminotomy, foraminotomy, possible microdiscectomy with possible resection of synovial cyst (Left ) - Left Lumbar 3-4 Lumbar laminotomy, foraminotomy, possible microdiscectomy with possible resection of synovial cyst   Anesthesia type:  General   Pre-op diagnosis:  Synovial cyst of lumbar spine   Location:  [facility] OR ROOM 18 / [place] [facility]   Surgeon:  Jovita Gamma, MD      DISCUSSION: Patient is a 53 year old female scheduled for the above procedure.  History includes smoking, chronic chest pain, CAD (50-60% RCA 2008, but RCA "normal" with < 10% LAD and DIAG by 08/27/15 cath, [facility]), non-ischemic cardiomyopathy (EF 40-50% 2006, EF normalized 2007), CHF, Prolonged QT, MVP, hypothyroidism, adrenal insufficiency/Addison's disease (dignosed ~ 2011; reported history of "cardiac arrest" due to Addisonian crisis), aneurysm (2 mm proximal right cavernous ICA aneurysm 04/01/16 CTA), nondiabetic gastroparesis, anxiety, C5-6/C6-7 ACDF 11/28/00. - ED visit for urinary retention 10/19/18. No growth on urine culture.  She denied SOB, cough, fever, and chest pain at her PAT RN visit. Patient reported plans to hold ASA 1 week before surgery. Patient instructed to take Cortef on the day of surgery.   Reviewed above with anesthesiologist Adele Barthel, MD. Given her previous cardiac history would recommend cardiology input prior to surgery. I left a voice message with Janett Billow at Dr. Donnella Bi office.   ADDENDUM 12/19/18 1:52 PM: Patient had preoperative cardiology evaluation at [facility] by [name], [place] on 12/19/18 by televisit. He wrote, "Preoperative clearance: Upcoming surgery by Dr. Sherwood Gambler.  She has been holding aspirin for the past 8 days.  She is able to complete at least 4 METS of activity without any issue.  She is cleared from cardiology perspective to proceed with surgery.  No further work-up is  needed." Vasospastic angina felt well controlled on amlodipine.   VS: BP (!) 161/94   Pulse 95   Temp 37 C   Resp 18   Ht 5\' 5"  (1.651 m)   Wt 79.7 kg   SpO2 98%   BMI 29.22 kg/m    PROVIDERS: Redmond School, MD is PCP - Jacelyn Pi, MD is endocrinologist - Lyman Bishop, MD is cardiologist. Last visit 04/22/17 with [name], [place]. He notes patient with history of multiple ED visits for evaluation of chest pain. She has had at least 4 cardiac cath (last 2016 at [facility]) showing non-obstructive CAD (50-60% RCA on 2008 cath) and symptoms favored to be from coronary spasm. Amlodipine continued. 6-9 month follow-up recommended. LAST 2016 CATH DOCUMENTED NORMAL CORONARIES (< 10% LAD/DIAG).   LABS: Labs reviewed: Acceptable for surgery. (all labs ordered are listed, but only abnormal results are displayed)  Labs Reviewed  CBC - Abnormal; Notable for the following components:      Result Value   WBC 10.8 (*)    Platelets 418 (*)    All other components within normal limits  SURGICAL PCR SCREEN  BASIC METABOLIC PANEL    IMAGES: MRI L-spine 11/14/18: IMPRESSION: 1. Progressive degenerative disc disease at L3-L4 with new moderate left lateral recess stenosis that could affect the descending right L4 nerve root. Unchanged moderate left neuroforaminal stenosis potentially affecting the exiting left L3 nerve root. 2. New 6 mm left-sided synovial cyst at L4-L5 with progressive moderate left lateral recess and neuroforaminal stenosis potentially affecting the left descending L5 and exiting L4 nerve roots, respectively.   EKG: 12/08/18: NSR. QT 398/QTc 456 ms.  CV: Cardiac cath 08/27/15 ([facility]): Coronary anatomy: Right dominant Left main: Normal LAD: Less than 10% DIAG: Less than 10% LCX normal OM: Normal with a large ramus intermedius RCA: Normal PDA/PLB: Normal Left ventriculogram: Ejection fraction 60%. LVEDP 13 ASSESSMENT:  1. Normal coronary  arteries 2. Normal LV pressure 3. Normal LV function PLAN:  1. Followup with PCP for noncardiac chest pain  Echo 05/26/15: Study Conclusions - Left ventricle: The cavity size was normal. Wall thickness was   normal. Systolic function was normal. The estimated ejection   fraction was in the range of 55% to 60%. Wall motion was normal;   there were no regional wall motion abnormalities. Doppler   parameters are consistent with abnormal left ventricular   relaxation (grade 1 diastolic dysfunction). - Atrial septum: There was redundancy of the septum, with   borderline criteria for aneurysm. Impressions: - Normal LV systolic function; grade 1 diastolic dysfunction;   trace MR.  Cardiac event monitor 12/22/10-01/20/11: NSR with occasional PVCs.   Past Medical History:  Diagnosis Date  . Addison's disease ([place])   . Adrenal insufficiency ([place])    diagnosed 2012  . Aneurysm ([place])   . Anxiety   . Arthritis   . Astigmatism   . CAD (coronary artery disease)    Cath 2008 EF normal. RCA 50-60, Septal 50%. Myoview 3/12: EF 53% normal perfusion  . Cardiac arrest ([place])    2/2 adissonian crisis  . Cardiomyopathy    resolved  . Chest pain    chronicc  . CHF (congestive heart failure) ([place])   . Chronic back pain   . Chronic diarrhea   . Concussion    sept 28th 2014  . Gastroparesis   . GERD (gastroesophageal reflux disease)   . HTN (hypertension)   . Hyperlipidemia   . Hypothyroidism   . Mitral valve prolapse   . Nondiabetic gastroparesis   . PONV (postoperative nausea and vomiting)   . QT prolongation   . Tobacco abuse    down to 2 cigarettes per day  . Vertigo     Past Surgical History:  Procedure Laterality Date  . ABDOMINAL HYSTERECTOMY    . CARDIAC CATHETERIZATION  03/2007   showed 60% lesion in the right coronary artery  . CHOLECYSTECTOMY    . LEFT HEART CATHETERIZATION WITH CORONARY ANGIOGRAM N/A 04/13/2012   Procedure: LEFT HEART CATHETERIZATION WITH CORONARY  ANGIOGRAM;  Surgeon: Hillary Bow, MD;  Location: [facility] CATH LAB;  Service: Cardiovascular;  Laterality: N/A;  . SPINE SURGERY    . VARICOSE VEIN SURGERY    . VESICOVAGINAL FISTULA CLOSURE W/ TAH      MEDICATIONS: . amLODipine (NORVASC) 10 MG tablet  . aspirin 81 MG chewable tablet  . atorvastatin (LIPITOR) 20 MG tablet  . cholecalciferol (VITAMIN D) 1000 UNITS tablet  . cyclobenzaprine (FLEXERIL) 10 MG tablet  . dicyclomine (BENTYL) 20 MG tablet  . furosemide (LASIX) 20 MG tablet  . gabapentin (NEURONTIN) 300 MG capsule  . hydrochlorothiazide (HYDRODIURIL) 12.5 MG tablet  . hydrocortisone (CORTEF) 10 MG tablet  . levothyroxine (SYNTHROID, LEVOTHROID) 75 MCG tablet  . meclizine (ANTIVERT) 25 MG tablet  . nitroGLYCERIN (NITROSTAT) 0.4 MG SL tablet  . ondansetron (ZOFRAN ODT) 4 MG disintegrating tablet  . oxyCODONE (ROXICODONE) 15 MG immediate release tablet  . oxyCODONE-acetaminophen (PERCOCET) 5-325 MG tablet  . pantoprazole (PROTONIX) 20 MG tablet  . phenazopyridine (PYRIDIUM) 200 MG tablet  . polyethylene glycol (MIRALAX / GLYCOLAX) packet  . promethazine (PHENERGAN)  25 MG suppository  . zolpidem (AMBIEN) 10 MG tablet   No current facility-administered medications for this encounter.   Not currently taking Miralax, Pyridium, Phenergan, Percocet, Bentyl   Myra Gianotti, PA-C Surgical Short Stay/Anesthesiology [facility] Phone [phone] [facility] Phone [phone] 12/08/2018 5:38 PM

## 2018-12-08 ENCOUNTER — Encounter (HOSPITAL_COMMUNITY)
Admission: RE | Admit: 2018-12-08 | Discharge: 2018-12-08 | Disposition: A | Discharge status: Home / Self Care | Payer: Medicare HMO | Source: Ambulatory Visit | Attending: Neurosurgery | Admitting: Neurosurgery

## 2018-12-08 ENCOUNTER — Encounter (HOSPITAL_COMMUNITY): Payer: Self-pay

## 2018-12-08 ENCOUNTER — Other Ambulatory Visit: Payer: Self-pay

## 2018-12-08 DIAGNOSIS — Z01818 Encounter for other preprocedural examination: Secondary | ICD-10-CM | POA: Diagnosis not present

## 2018-12-08 HISTORY — DX: Gastro-esophageal reflux disease without esophagitis: K21.9

## 2018-12-08 HISTORY — DX: Unspecified osteoarthritis, unspecified site: M19.90

## 2018-12-08 HISTORY — DX: Other specified postprocedural states: Z98.890

## 2018-12-08 HISTORY — DX: Nausea with vomiting, unspecified: R11.2

## 2018-12-08 LAB — BASIC METABOLIC PANEL
Anion gap: 10 (ref 5–15)
BUN: 11 mg/dL (ref 6–20)
CO2: 24 mmol/L (ref 22–32)
Calcium: 9 mg/dL (ref 8.9–10.3)
Chloride: 102 mmol/L (ref 98–111)
Creatinine, Ser: 0.83 mg/dL (ref 0.44–1.00)
GFR calc Af Amer: >60 mL/min (ref >60)
GFR calc non Af Amer: >60 mL/min (ref >60)
Glucose, Bld: 92 mg/dL (ref 70–99)
Potassium: 4 mmol/L (ref 3.5–5.1)
Sodium: 136 mmol/L (ref 135–145)

## 2018-12-08 LAB — SURGICAL PCR SCREEN
MRSA, PCR: NEGATIVE
Staphylococcus aureus: NEGATIVE

## 2018-12-08 LAB — CBC
HCT: 43.8 % (ref 36.0–46.0)
Hemoglobin: 14.1 g/dL (ref 12.0–15.0)
MCH: 27.6 pg (ref 26.0–34.0)
MCHC: 32.2 g/dL (ref 30.0–36.0)
MCV: 85.9 fL (ref 80.0–100.0)
Platelets: 418 10*3/uL — ABNORMAL HIGH (ref 150–400)
RBC: 5.1 MIL/uL (ref 3.87–5.11)
RDW: 14.2 % (ref 11.5–15.5)
WBC: 10.8 10*3/uL — ABNORMAL HIGH (ref 4.0–10.5)
nRBC: 0 % (ref 0.0–0.2)

## 2018-12-08 NOTE — Progress Notes (Addendum)
PCP - Dr. Redmond School Cardiologist - not currently, been a few years per pt/cardiologist retired [employer] - Dr. Jacelyn Pi  Chest x-ray - denies EKG -12/08/2018 Stress Test - 08/23/2005 ECHO - 05/26/15 Cardiac Cath - 08/2015  Sleep Study - denies CPAP - N/A  Blood Thinner Instructions: N/A Aspirin Instructions: per pt. Will stop 7-8 days prior surgery  Anesthesia review: Yes, heart hx, CAD  Coronavirus Screening  Have you experienced the following symptoms:  Cough yes/no: No Fever (>100.3F)  yes/no: No Runny nose yes/no: No Sore throat yes/no: No Difficulty breathing/shortness of breath  yes/no: No  Have you or a family member traveled in the last 14 days and where? yes/no: No  If the patient indicates "YES" to the above questions, their PAT will be rescheduled to limit the exposure to others and, the surgeon will be notified. THE PATIENT WILL NEED TO BE ASYMPTOMATIC FOR 14 DAYS.   If the patient is not experiencing any of these symptoms, the PAT nurse will instruct them to NOT bring anyone with them to their appointment since they may have these symptoms or traveled as well.   Please remind your patients and families that hospital visitation restrictions are in effect and the importance of the restrictions.   Patient denies shortness of breath, fever, cough and chest pain at PAT appointment  Patient verbalized understanding of instructions that were given to them at the PAT appointment. Patient was also instructed that they will need to review over the PAT instructions again at home before surgery.

## 2018-12-08 NOTE — Pre-Procedure Instructions (Signed)
[name]  12/08/2018      [id number] - [place], [place] [address] [address] Phone: [phone] Fax: [phone]    Your procedure is scheduled on Wednesday, December 20, 2018  Report to [facility] at 6:30 am.  Call this number if you have problems the morning of surgery:  [phone]   Remember:  Do not eat or drink after midnight Tuesday.    Take these medicines the morning of surgery with A SIP OF WATER:  amLODipine (NORVASC)      atorvastatin (LIPITOR)    gabapentin (NEURONTIN) hydrocortisone (CORTEF) meclizine (ANTIVERT) if needed levothyroxine (SYNTHROID, LEVOTHROID)      If needed- nitroGLYCERIN (NITROSTAT), ondansetron (ZOFRAN ODT), oxyCODONE (ROXICODONE), pantoprazole (PROTONIX) if needed      7 days prior to surgery STOP taking any Aspirin (unless otherwise instructed by your surgeon), Aleve, Naproxen, Ibuprofen, Motrin, Advil, Goody's, BC's, all herbal medications, fish oil, and all vitamins.  Follow your surgeon's instructions on when to stop Asprin.  If no instructions were given by your surgeon then you will need to call the office to get those instructions.      Do not wear jewelry, make-up or nail polish.  Do not wear lotions, powders, or perfumes, or deodorant.  Do not shave 48 hours prior to surgery.    Do not bring valuables to the hospital.  [facility] is not responsible for any belongings or valuables.  Contacts, dentures or bridgework may not be worn into surgery.  Leave your suitcase in the car.  After surgery it may be brought to your room.  For patients admitted to the hospital, discharge time will be determined by your treatment team.  Patients discharged the day of surgery will not be allowed to drive home.      [place]- Preparing For Surgery  Before surgery, you can play an important role. Because skin is not sterile, your skin needs to be as free of germs as  possible. You can reduce the number of germs on your skin by washing with CHG (chlorahexidine gluconate) Soap before surgery.  CHG is an antiseptic cleaner which kills germs and bonds with the skin to continue killing germs even after washing.    Oral Hygiene is also important to reduce your risk of infection.  Remember - BRUSH YOUR TEETH THE MORNING OF SURGERY WITH YOUR REGULAR TOOTHPASTE  Please do not use if you have an allergy to CHG or antibacterial soaps. If your skin becomes reddened/irritated stop using the CHG.  Do not shave (including legs and underarms) for at least 48 hours prior to first CHG shower. It is OK to shave your face.  Please follow these instructions carefully.   1. Shower the NIGHT BEFORE SURGERY (Tuesday) and the MORNING OF SURGERY (Wed.) with CHG.   2. If you chose to wash your hair, wash your hair first as usual with your normal shampoo.  3. After you shampoo, rinse your hair and body thoroughly to remove the shampoo.  4. Use CHG as you would any other liquid soap. You can apply CHG directly to the skin and wash gently with a scrungie or a clean washcloth.   5. Apply the CHG Soap to your body ONLY FROM THE NECK DOWN.  Do not use on open wounds or open sores. Avoid contact with your eyes, ears, mouth and genitals (private parts). Wash Face and genitals (private parts)  with your normal soap.  6. Wash thoroughly, paying special attention to the area where your surgery will be performed.  7. Thoroughly rinse your body with warm water from the neck down.  8. DO NOT shower/wash with your normal soap after using and rinsing off the CHG Soap.  9. Pat yourself dry with a CLEAN TOWEL.  10. Wear CLEAN PAJAMAS to bed the night before surgery, wear comfortable clothes the morning of surgery  11. Place CLEAN SHEETS on your bed the night of your first shower and DO NOT SLEEP WITH PETS.  Day of Surgery:  Do not apply any deodorants/lotions.  Please wear clean clothes to  the hospital/surgery center.   Remember to brush your teeth WITH YOUR REGULAR TOOTHPASTE.  Please read over the following fact sheets that you were given.

## 2018-12-13 ENCOUNTER — Telehealth: Payer: Self-pay | Admitting: *Deleted

## 2018-12-13 NOTE — Telephone Encounter (Signed)
   [facility] Pre-operative Risk Assessment    Request for surgical clearance:  What type of surgery is being performed?  L 3 -5 LUMBAR LAMINOTOMY/RESECTION OF SYNOVIAL CYST  1. When is this surgery scheduled?  12/19/18   2. What type of clearance is required (medical clearance vs. Pharmacy clearance to hold med vs. Both)? MEDICAL  3. Are there any medications that need to be held prior to surgery and how long? N/A  4. Practice name and name of physician performing surgery?  [facility]  5. What is your office phone number [phone]   7.   What is your office fax number [phone]  8.   Anesthesia type (None, local, MAC, general) ? GENERAL   Devra Dopp 12/13/2018, 4:32 PM  _________________________________________________________________   (provider comments below)

## 2018-12-13 NOTE — Telephone Encounter (Signed)
   Primary Cardiologist:Kenneth C Hilty, MD  Chart reviewed as part of pre-operative protocol coverage. Because of [name]'s past medical history and time since last visit, he/she will require a follow-up visit in order to better assess preoperative cardiovascular risk.  Pre-op covering staff: - Please schedule appointment and call patient to inform them.  Please schedule a telehealth VIDEO visit with Dr. Debara Pickett or [name] in the next 2 weeks.  Surgery is scheduled for 12/19/18.  - Please contact requesting surgeon's office via preferred method (i.e, phone, fax) to inform them of need for appointment prior to surgery.  If applicable, this message will also be routed to pharmacy pool and/or primary cardiologist for input on holding anticoagulant/antiplatelet agent as requested below so that this information is available at time of patient's appointment.   Tami Lin Duke, PA  12/13/2018, 4:42 PM

## 2018-12-15 NOTE — Telephone Encounter (Signed)
Follow up    Patient is calling in reference to pre-op clearance.

## 2018-12-18 ENCOUNTER — Telehealth: Payer: Self-pay

## 2018-12-18 NOTE — Telephone Encounter (Signed)
Called and left a detailed message for the patient to call our office back to get her put on the scheduled for a virtual visit for her upcoming surgery

## 2018-12-18 NOTE — Telephone Encounter (Signed)
Patient called back to the office. Patient is scheduled for 4/14 at [place] with [name], PA-C for a telephone virtual visit

## 2018-12-18 NOTE — Telephone Encounter (Signed)
Virtual Visit Pre-Appointment Phone Call  Steps For Call:  1. Confirm consent - "In the setting of the current Covid19 crisis, you are scheduled for a Telephone visit with [name], PA-C on 12/19/2018 at 9:00am.  Just as we do with many in-office visits, in order for you to participate in this visit, we must obtain consent.  If you'd like, I can send this to your mychart (if signed up) or email for you to review.  Otherwise, I can obtain your verbal consent now.  All virtual visits are billed to your insurance company just like a normal visit would be.  By agreeing to a virtual visit, we'd like you to understand that the technology does not allow for your provider to perform an examination, and thus may limit your provider's ability to fully assess your condition.  Finally, though the technology is pretty good, we cannot assure that it will always work on either your or our end, and in the setting of a video visit, we may have to convert it to a phone-only visit.  In either situation, we cannot ensure that we have a secure connection.  Are you willing to proceed?"  2. Give patient instructions for WebEx download to smartphone as below if video visit  3. Advise patient to be prepared with any vital sign or heart rhythm information, their current medicines, and a piece of paper and pen handy for any instructions they may receive the day of their visit  4. Inform patient they will receive a phone call 15 minutes prior to their appointment time (may be from unknown caller ID) so they should be prepared to answer  5. Confirm that appointment type is correct in Epic appointment notes (video vs telephone)    TELEPHONE CALL NOTE  [name] has been deemed a candidate for a follow-up tele-health visit to limit community exposure during the Covid-19 pandemic. I spoke with the patient via phone to ensure availability of phone/video source, confirm preferred email & phone number, and discuss  instructions and expectations.  I reminded [name] to be prepared with any vital sign and/or heart rhythm information that could potentially be obtained via home monitoring, at the time of her visit. I reminded [name] to expect a phone call at the time of her visit if her visit.  Did the patient verbally acknowledge consent to treatment? YES  [name], [place] 12/18/2018 4:43 PM   DOWNLOADING THE [place], go to [employer] and type in WebEx in the search bar. [place] [employer], the blue/green circle. The app is free but as with any other app downloads, their phone may require them to verify saved payment information or Apple password. The patient does NOT have to create an account.  - If Android, ask patient to go to [employer] and type in WebEx in the search bar. [place] [employer], the blue/green circle. The app is free but as with any other app downloads, their phone may require them to verify saved payment information or Android password. The patient does NOT have to create an account.   CONSENT FOR TELE-HEALTH VISIT - PLEASE REVIEW  I hereby voluntarily request, consent and authorize [facility] and its employed or contracted physicians, physician assistants, nurse practitioners or other licensed health care professionals (the Practitioner), to provide me with telemedicine health care services (the "Services") as deemed necessary by the treating Practitioner. I acknowledge  and consent to receive the Services by the Practitioner via telemedicine. I understand that the telemedicine visit will involve communicating with the Practitioner through live audiovisual communication technology and the disclosure of certain medical information by electronic transmission. I acknowledge that I have been given the opportunity to request an in-person assessment or other available alternative prior to the telemedicine visit and am  voluntarily participating in the telemedicine visit.  I understand that I have the right to withhold or withdraw my consent to the use of telemedicine in the course of my care at any time, without affecting my right to future care or treatment, and that the Practitioner or I may terminate the telemedicine visit at any time. I understand that I have the right to inspect all information obtained and/or recorded in the course of the telemedicine visit and may receive copies of available information for a reasonable fee.  I understand that some of the potential risks of receiving the Services via telemedicine include:  Marland Kitchen Delay or interruption in medical evaluation due to technological equipment failure or disruption; . Information transmitted may not be sufficient (e.g. poor resolution of images) to allow for appropriate medical decision making by the Practitioner; and/or  . In rare instances, security protocols could fail, causing a breach of personal health information.  Furthermore, I acknowledge that it is my responsibility to provide information about my medical history, conditions and care that is complete and accurate to the best of my ability. I acknowledge that Practitioner's advice, recommendations, and/or decision may be based on factors not within their control, such as incomplete or inaccurate data provided by me or distortions of diagnostic images or specimens that may result from electronic transmissions. I understand that the practice of medicine is not an exact science and that Practitioner makes no warranties or guarantees regarding treatment outcomes. I acknowledge that I will receive a copy of this consent concurrently upon execution via email to the email address I last provided but may also request a printed copy by calling the office of [place].    I understand that my insurance will be billed for this visit.   I have read or had this consent read to me. . I understand the  contents of this consent, which adequately explains the benefits and risks of the Services being provided via telemedicine.  . I have been provided ample opportunity to ask questions regarding this consent and the Services and have had my questions answered to my satisfaction. . I give my informed consent for the services to be provided through the use of telemedicine in my medical care  By participating in this telemedicine visit I agree to the above.

## 2018-12-18 NOTE — Telephone Encounter (Signed)
Follow up   Patient is calling about pre-op clearance.She states that she can be reached at [phone].

## 2018-12-19 ENCOUNTER — Telehealth (INDEPENDENT_AMBULATORY_CARE_PROVIDER_SITE_OTHER): Payer: Medicare HMO | Admitting: Physician Assistant

## 2018-12-19 ENCOUNTER — Encounter: Payer: Self-pay | Admitting: Physician Assistant

## 2018-12-19 ENCOUNTER — Other Ambulatory Visit: Payer: Self-pay | Admitting: Neurosurgery

## 2018-12-19 ENCOUNTER — Telehealth: Payer: Self-pay | Admitting: Physician Assistant

## 2018-12-19 VITALS — BP 115/86 | Ht 65.0 in | Wt 176.0 lb

## 2018-12-19 DIAGNOSIS — Z01810 Encounter for preprocedural cardiovascular examination: Secondary | ICD-10-CM

## 2018-12-19 DIAGNOSIS — Z720 Tobacco use: Secondary | ICD-10-CM

## 2018-12-19 DIAGNOSIS — F17210 Nicotine dependence, cigarettes, uncomplicated: Secondary | ICD-10-CM | POA: Diagnosis not present

## 2018-12-19 DIAGNOSIS — I10 Essential (primary) hypertension: Secondary | ICD-10-CM

## 2018-12-19 DIAGNOSIS — E785 Hyperlipidemia, unspecified: Secondary | ICD-10-CM

## 2018-12-19 DIAGNOSIS — I201 Angina pectoris with documented spasm: Secondary | ICD-10-CM

## 2018-12-19 NOTE — Telephone Encounter (Signed)
Pt saw [name], [place] today via Telemedicine visit for pre-op clearance.

## 2018-12-19 NOTE — Progress Notes (Signed)
Virtual Visit via Telephone Note   This visit type was conducted due to national recommendations for restrictions regarding the COVID-19 Pandemic (e.g. social distancing) in an effort to limit this patient's exposure and mitigate transmission in our community.  Due to her co-morbid illnesses, this patient is at least at moderate risk for complications without adequate follow up.  This format is felt to be most appropriate for this patient at this time.  The patient did not have access to video technology/had technical difficulties with video requiring transitioning to audio format only (telephone).  All issues noted in this document were discussed and addressed.  No physical exam could be performed with this format.  Please refer to the patient's chart for her  consent to telehealth for [facility].   Evaluation Performed:  Follow-up visit  Date:  12/19/2018   ID:  [name], DOB 11/26/1965, MRN [id number]  Patient Location: Home  Provider Location: Home  PCP:  Redmond School, MD  Cardiologist:  Pixie Casino, MD  Electrophysiologist:  None   Chief Complaint:  Preoperative clearance  History of Present Illness:    [name] is a 53 y.o. female who presents via audio/video conferencing for a telehealth visit today.     [name] is a 53 year old female with PMH of HTN, HLD, tobacco abuse. In 2013, she was evaluated for chest pain with left heart cath which showed preserved LV function and no significant obstructive coronary artery disease. Previously has been reported there was a 50-60% stenosis in mid RCA which was noted to be completely resolved in 2013 favoring diagnosis of coronary spasm. She has a history of Addison's disease, hypothyroidism, prolonged QT interval and remote history of nonischemic cardiomyopathy with EF 40-45% which later normalized.  Last echocardiogram obtained on 05/26/2015 showed EF 55 to 60%, grade 1 DD.  The years, she has had occasional onset of  atypical chest pain.   She presents today for preoperative clearance prior to L3-5 lumbar laminectomy and resection of synovial cyst by neurosurgery Dr. Sherwood Gambler.  Despite her significant back pain, she has not experienced any recent exertional chest pain or shortness of breath.  She is able to climb up 2 flight of stairs at her friend's house without any issue.  Since she is able to complete at least 4 METS of activity, she is cleared to proceed with surgery without further work-up.  Last lipid panel in the epic system was dated back 10 years, she says her PCP has been checking her cholesterol.  Otherwise her blood pressure is very well controlled.  I will continue her on the current medication.  The patient does not have symptoms concerning for COVID-19 infection (fever, chills, cough, or new shortness of breath).    Past Medical History:  Diagnosis Date  . Addison's disease ([place])   . Adrenal insufficiency ([place])    diagnosed 2012  . Aneurysm ([place])   . Anxiety   . Arthritis   . Astigmatism   . CAD (coronary artery disease)    Cath 2008 EF normal. RCA 50-60, Septal 50%. Myoview 3/12: EF 53% normal perfusion  . Cardiac arrest ([place])    2/2 adissonian crisis  . Cardiomyopathy    resolved  . Chest pain    chronicc  . CHF (congestive heart failure) ([place])   . Chronic back pain   . Chronic diarrhea   . Concussion    sept 28th 2014  . Gastroparesis   . GERD (gastroesophageal reflux disease)   .  HTN (hypertension)   . Hyperlipidemia   . Hypothyroidism   . Mitral valve prolapse   . Nondiabetic gastroparesis   . PONV (postoperative nausea and vomiting)   . QT prolongation   . Tobacco abuse    down to 2 cigarettes per day  . Vertigo    Past Surgical History:  Procedure Laterality Date  . ABDOMINAL HYSTERECTOMY    . CARDIAC CATHETERIZATION  03/2007   showed 60% lesion in the right coronary artery  . CHOLECYSTECTOMY    . LEFT HEART CATHETERIZATION WITH CORONARY ANGIOGRAM N/A  04/13/2012   Procedure: LEFT HEART CATHETERIZATION WITH CORONARY ANGIOGRAM;  Surgeon: Hillary Bow, MD;  Location: [facility] CATH LAB;  Service: Cardiovascular;  Laterality: N/A;  . SPINE SURGERY    . VARICOSE VEIN SURGERY    . VESICOVAGINAL FISTULA CLOSURE W/ TAH       Current Meds  Medication Sig  . amLODipine (NORVASC) 10 MG tablet Take 10 mg by mouth daily.   Marland Kitchen atorvastatin (LIPITOR) 20 MG tablet Take 20 mg by mouth every morning.   . cholecalciferol (VITAMIN D) 1000 UNITS tablet Take 1,000 Units by mouth daily.  . cyclobenzaprine (FLEXERIL) 10 MG tablet Take 10 mg by mouth 3 (three) times daily as needed for muscle spasms.   . furosemide (LASIX) 20 MG tablet Take 20 mg by mouth daily as needed for fluid.   Marland Kitchen gabapentin (NEURONTIN) 300 MG capsule Take 300 mg by mouth 4 (four) times daily.   . hydrochlorothiazide (HYDRODIURIL) 12.5 MG tablet Take 6.25 mg by mouth daily as needed (for High Blood Pressure >145/98). Second line medication.  . hydrocortisone (CORTEF) 10 MG tablet Take 10-20 mg by mouth See admin instructions. Takes 20 mg in the morning and 10 mg at night  . levothyroxine (SYNTHROID, LEVOTHROID) 75 MCG tablet Take 75 mcg by mouth daily before breakfast.  . meclizine (ANTIVERT) 25 MG tablet 1 or 2 tabs PO q8h prn dizziness (Patient taking differently: Take 25-50 mg by mouth 3 (three) times daily as needed (for vertigo). )  . nitroGLYCERIN (NITROSTAT) 0.4 MG SL tablet Place 0.4 mg under the tongue every 5 (five) minutes as needed for chest pain.  Marland Kitchen ondansetron (ZOFRAN ODT) 4 MG disintegrating tablet Take 1 tablet (4 mg total) by mouth every 8 (eight) hours as needed for nausea.  Marland Kitchen oxyCODONE (ROXICODONE) 15 MG immediate release tablet Take 15-30 mg by mouth every 4 (four) hours as needed for pain.   . pantoprazole (PROTONIX) 20 MG tablet Take 1 tablet (20 mg total) by mouth daily. (Patient taking differently: Take 20 mg by mouth daily as needed for indigestion. )  . zolpidem (AMBIEN) 10  MG tablet Take 10 mg by mouth at bedtime.     Allergies:   Bee venom; Doxycycline; Erythromycin; Ibuprofen; Penicillins; Sulfa antibiotics; Cat hair extract; Dust mite extract; Tramadol; Morphine and related; and Sulfonamide derivatives   Social History   Tobacco Use  . Smoking status: Current Every Day Smoker    Packs/day: 0.25    Years: 10.00    Pack years: 2.50    Types: Cigarettes  . Smokeless tobacco: Never Used  . Tobacco comment: smokes 2 a day  Substance Use Topics  . Alcohol use: No  . Drug use: No    Comment: Hisrory of Cocaine use      Family Hx: The patient's family history includes Cancer in her father; Coronary artery disease in an other family member; Heart attack in her mother.  ROS:   Please see the history of present illness.     All other systems reviewed and are negative.   Prior CV studies:   The following studies were reviewed today:  Echo 05/26/2015 LV EF: 55% -   60% Study Conclusions  - Left ventricle: The cavity size was normal. Wall thickness was   normal. Systolic function was normal. The estimated ejection   fraction was in the range of 55% to 60%. Wall motion was normal;   there were no regional wall motion abnormalities. Doppler   parameters are consistent with abnormal left ventricular   relaxation (grade 1 diastolic dysfunction). - Atrial septum: There was redundancy of the septum, with   borderline criteria for aneurysm.  Impressions:  - Normal LV systolic function; grade 1 diastolic dysfunction;   trace MR.  Labs/Other Tests and Data Reviewed:    EKG:  An ECG dated 12/08/2018 was personally reviewed today and demonstrated:  Normal sinus rhythm without significant ST-T wave changes  Recent Labs: 12/08/2018: BUN 11; Creatinine, Ser 0.83; Hemoglobin 14.1; Platelets 418; Potassium 4.0; Sodium 136   Recent Lipid Panel Lab Results  Component Value Date/Time   CHOL 183 01/01/2009 01:03 PM   TRIG 112.0 01/01/2009 01:03 PM   HDL  31.20 (L) 01/01/2009 01:03 PM   CHOLHDL 6 01/01/2009 01:03 PM   LDLCALC 129 (H) 01/01/2009 01:03 PM    Wt Readings from Last 3 Encounters:  12/19/18 176 lb (79.8 kg)  12/08/18 175 lb 9.6 oz (79.7 kg)  10/19/18 180 lb (81.6 kg)     Objective:    Vital Signs:  BP 115/86   Ht 5\' 5"  (1.651 m)   Wt 176 lb (79.8 kg)   BMI 29.29 kg/m    Well nourished, well developed female in no acute distress.   ASSESSMENT & PLAN:    1. Preoperative clearance: Upcoming surgery by Dr. Sherwood Gambler.  She has been holding aspirin for the past 8 days.  She is able to complete at least 4 METS of activity without any issue.  She is cleared from cardiology perspective to proceed with surgery.  No further work-up is needed.  2. Hypertension: Blood pressure stable on current medication  3. Hyperlipidemia: Continue Lipitor, annual lipid panel followed by primary care provider  4. Tobacco abuse: Tobacco cessation discussed, she continues to smoke roughly 10 cigarettes/day.  I discussed with the patient the importance of tobacco cessation to prevent coronary artery disease.  5. Vasospastic angina: Well-controlled on amlodipine.  COVID-19 Education: The signs and symptoms of COVID-19 were discussed with the patient and how to seek care for testing (follow up with PCP or arrange E-visit).  The importance of social distancing was discussed today.  Time:   Today, I have spent 7 minutes with the patient with telehealth technology discussing the above problems.     Medication Adjustments/Labs and Tests Ordered: Current medicines are reviewed at length with the patient today.  Concerns regarding medicines are outlined above.   Tests Ordered: No orders of the defined types were placed in this encounter.   Medication Changes: No orders of the defined types were placed in this encounter.   Disposition:  Follow up in 1 year(s)  Signed, [name], PA  12/19/2018 9:23 AM    [facility]

## 2018-12-19 NOTE — Patient Instructions (Signed)
Medication Instructions:   Your physician recommends that you continue on your current medications as directed. Please refer to the Current Medication list given to you today.  If you need a refill on your cardiac medications before your next appointment, please call your pharmacy.   Lab work: NONE ordered at this time of appointment   If you have labs (blood work) drawn today and your tests are completely normal, you will receive your results only by: Marland Kitchen MyChart Message (if you have MyChart) OR . A paper copy in the mail If you have any lab test that is abnormal or we need to change your treatment, we will call you to review the results.  Testing/Procedures:  NONE ordered at this time of appointment   Follow-Up: At [facility], you and your health needs are our priority.  As part of our continuing mission to provide you with exceptional heart care, we have created designated Provider Care Teams.  These Care Teams include your primary Cardiologist (physician) and Advanced Practice Providers (APPs -  Physician Assistants and Nurse Practitioners) who all work together to provide you with the care you need, when you need it. You will need a follow up appointment in 12 months (April 2021).  Please call our office in February 2021 to schedule this appointment.  You may see Pixie Casino, MD or one of the following Advanced Practice Providers on your designated Care Team: [place], [place] . Fabian Sharp, PA-C  Any Other Special Instructions Will Be Listed Below (If Applicable). You have been cleared for your surgery/procedure

## 2018-12-19 NOTE — Anesthesia Preprocedure Evaluation (Addendum)
Anesthesia Evaluation  Patient identified by MRN, date of birth, ID band Patient awake    Reviewed: Allergy & Precautions, NPO status , Patient's Chart, lab work & pertinent test results  History of Anesthesia Complications (+) PONV  Airway Mallampati: II  TM Distance: >3 FB Neck ROM: Full    Dental  (+) Dental Advisory Given   Pulmonary Current Smoker,    breath sounds clear to auscultation       Cardiovascular hypertension, Pt. on medications + CAD (LAST 2016 CATH DOCUMENTED NORMAL CORONARIES (< 10% LAD/DIAG).) and +CHF   Rhythm:Regular  Normal EF on 2016 Echo.   Neuro/Psych  Neuromuscular disease    GI/Hepatic Neg liver ROS, GERD  ,  Endo/Other  Hypothyroidism Hx addison's dz.   Renal/GU negative Renal ROS     Musculoskeletal  (+) Arthritis ,   Abdominal   Peds  Hematology negative hematology ROS (+)   Anesthesia Other Findings   Reproductive/Obstetrics                           Lab Results  Component Value Date   WBC 10.8 (H) 12/08/2018   HGB 14.1 12/08/2018   HCT 43.8 12/08/2018   MCV 85.9 12/08/2018   PLT 418 (H) 12/08/2018   Lab Results  Component Value Date   CREATININE 0.83 12/08/2018   BUN 11 12/08/2018   NA 136 12/08/2018   K 4.0 12/08/2018   CL 102 12/08/2018   CO2 24 12/08/2018    Anesthesia Physical Anesthesia Plan  ASA: III  Anesthesia Plan: General   Post-op Pain Management:    Induction: Intravenous  PONV Risk Score and Plan: 3 and Scopolamine patch - Pre-op, Dexamethasone, Ondansetron and Treatment may vary due to age or medical condition  Airway Management Planned: Oral ETT  Additional Equipment:   Intra-op Plan:   Post-operative Plan: Extubation in OR  Informed Consent: I have reviewed the patients History and Physical, chart, labs and discussed the procedure including the risks, benefits and alternatives for the proposed anesthesia with the  patient or authorized representative who has indicated his/her understanding and acceptance.     Dental advisory given  Plan Discussed with: CRNA  Anesthesia Plan Comments: ( )      Anesthesia Quick Evaluation

## 2018-12-19 NOTE — Telephone Encounter (Signed)
Follow up:   patient doctor office called to report they did received the fax on this patient.

## 2018-12-19 NOTE — Progress Notes (Signed)
Denies fever, cough, shob, COVID-19 exposure, or travel since PAT appointment. 

## 2018-12-20 ENCOUNTER — Encounter (HOSPITAL_COMMUNITY)
Admission: RE | Disposition: A | Discharge status: Home / Self Care | Payer: Self-pay | Source: Home / Self Care | Attending: Neurosurgery

## 2018-12-20 ENCOUNTER — Observation Stay (HOSPITAL_COMMUNITY)
Admission: RE | Admit: 2018-12-20 | Discharge: 2018-12-21 | Disposition: A | Discharge status: Home / Self Care | Payer: Medicare HMO | Attending: Neurosurgery | Admitting: Neurosurgery

## 2018-12-20 ENCOUNTER — Encounter (HOSPITAL_COMMUNITY): Payer: Self-pay | Admitting: Registered Nurse

## 2018-12-20 ENCOUNTER — Other Ambulatory Visit: Payer: Self-pay

## 2018-12-20 ENCOUNTER — Ambulatory Visit (HOSPITAL_COMMUNITY): Payer: Medicare HMO

## 2018-12-20 ENCOUNTER — Ambulatory Visit (HOSPITAL_COMMUNITY): Payer: Medicare HMO | Admitting: Vascular Surgery

## 2018-12-20 DIAGNOSIS — Z885 Allergy status to narcotic agent status: Secondary | ICD-10-CM | POA: Insufficient documentation

## 2018-12-20 DIAGNOSIS — Z8674 Personal history of sudden cardiac arrest: Secondary | ICD-10-CM | POA: Diagnosis not present

## 2018-12-20 DIAGNOSIS — E271 Primary adrenocortical insufficiency: Secondary | ICD-10-CM | POA: Insufficient documentation

## 2018-12-20 DIAGNOSIS — E785 Hyperlipidemia, unspecified: Secondary | ICD-10-CM | POA: Diagnosis not present

## 2018-12-20 DIAGNOSIS — M7138 Other bursal cyst, other site: Secondary | ICD-10-CM | POA: Diagnosis present

## 2018-12-20 DIAGNOSIS — F17210 Nicotine dependence, cigarettes, uncomplicated: Secondary | ICD-10-CM | POA: Insufficient documentation

## 2018-12-20 DIAGNOSIS — Z79890 Hormone replacement therapy (postmenopausal): Secondary | ICD-10-CM | POA: Insufficient documentation

## 2018-12-20 DIAGNOSIS — K219 Gastro-esophageal reflux disease without esophagitis: Secondary | ICD-10-CM | POA: Insufficient documentation

## 2018-12-20 DIAGNOSIS — Z7982 Long term (current) use of aspirin: Secondary | ICD-10-CM | POA: Insufficient documentation

## 2018-12-20 DIAGNOSIS — M48061 Spinal stenosis, lumbar region without neurogenic claudication: Principal | ICD-10-CM | POA: Insufficient documentation

## 2018-12-20 DIAGNOSIS — M4316 Spondylolisthesis, lumbar region: Secondary | ICD-10-CM | POA: Diagnosis not present

## 2018-12-20 DIAGNOSIS — M1990 Unspecified osteoarthritis, unspecified site: Secondary | ICD-10-CM | POA: Insufficient documentation

## 2018-12-20 DIAGNOSIS — I509 Heart failure, unspecified: Secondary | ICD-10-CM | POA: Diagnosis not present

## 2018-12-20 DIAGNOSIS — M5116 Intervertebral disc disorders with radiculopathy, lumbar region: Secondary | ICD-10-CM | POA: Diagnosis not present

## 2018-12-20 DIAGNOSIS — M5416 Radiculopathy, lumbar region: Secondary | ICD-10-CM | POA: Insufficient documentation

## 2018-12-20 DIAGNOSIS — I110 Hypertensive heart disease with heart failure: Secondary | ICD-10-CM | POA: Insufficient documentation

## 2018-12-20 DIAGNOSIS — I2510 Atherosclerotic heart disease of native coronary artery without angina pectoris: Secondary | ICD-10-CM | POA: Diagnosis not present

## 2018-12-20 DIAGNOSIS — E039 Hypothyroidism, unspecified: Secondary | ICD-10-CM | POA: Diagnosis not present

## 2018-12-20 DIAGNOSIS — G8929 Other chronic pain: Secondary | ICD-10-CM | POA: Insufficient documentation

## 2018-12-20 DIAGNOSIS — Z882 Allergy status to sulfonamides status: Secondary | ICD-10-CM | POA: Insufficient documentation

## 2018-12-20 DIAGNOSIS — Z880 Allergy status to penicillin: Secondary | ICD-10-CM | POA: Diagnosis not present

## 2018-12-20 DIAGNOSIS — Z79899 Other long term (current) drug therapy: Secondary | ICD-10-CM | POA: Diagnosis not present

## 2018-12-20 DIAGNOSIS — Z419 Encounter for procedure for purposes other than remedying health state, unspecified: Secondary | ICD-10-CM

## 2018-12-20 HISTORY — PX: LUMBAR LAMINECTOMY/DECOMPRESSION MICRODISCECTOMY: SHX5026

## 2018-12-20 SURGERY — LUMBAR LAMINECTOMY/DECOMPRESSION MICRODISCECTOMY 2 LEVELS
Anesthesia: General | Site: Spine Lumbar | Laterality: Left

## 2018-12-20 MED ORDER — METOCLOPRAMIDE HCL 5 MG/ML IJ SOLN: 10.0000 mg | Freq: Once | INTRAMUSCULAR | Status: DC | PRN

## 2018-12-20 MED ORDER — LIDOCAINE-EPINEPHRINE 1 %-1:100000 IJ SOLN
INTRAMUSCULAR | Status: AC
  Filled 2018-12-20: qty 1

## 2018-12-20 MED ORDER — ACETAMINOPHEN 10 MG/ML IV SOLN
INTRAVENOUS | Status: DC | PRN
  Administered 2018-12-20: 1000 mg via INTRAVENOUS

## 2018-12-20 MED ORDER — KETOROLAC TROMETHAMINE 30 MG/ML IJ SOLN
30.0000 mg | Freq: Once | INTRAMUSCULAR | Status: AC
  Administered 2018-12-20: 30 mg via INTRAVENOUS

## 2018-12-20 MED ORDER — FENTANYL CITRATE (PF) 250 MCG/5ML IJ SOLN
INTRAMUSCULAR | Status: AC
  Filled 2018-12-20: qty 5

## 2018-12-20 MED ORDER — HYDROMORPHONE HCL 1 MG/ML IJ SOLN
INTRAMUSCULAR | Status: AC
  Filled 2018-12-20: qty 1

## 2018-12-20 MED ORDER — OXYCODONE HCL 5 MG PO TABS
5.0000 mg | ORAL_TABLET | ORAL | Status: DC | PRN
  Administered 2018-12-20 – 2018-12-21 (×5): 10 mg via ORAL
  Filled 2018-12-20 (×5): qty 2

## 2018-12-20 MED ORDER — METHYLPREDNISOLONE ACETATE 80 MG/ML IJ SUSP
INTRAMUSCULAR | Status: AC
  Filled 2018-12-20: qty 1

## 2018-12-20 MED ORDER — SODIUM CHLORIDE 0.9% FLUSH: 3.0000 mL | INTRAVENOUS | Status: DC | PRN

## 2018-12-20 MED ORDER — 0.9 % SODIUM CHLORIDE (POUR BTL) OPTIME
TOPICAL | Status: DC | PRN
  Administered 2018-12-20: 1000 mL

## 2018-12-20 MED ORDER — ACETAMINOPHEN 650 MG RE SUPP: 650.0000 mg | RECTAL | Status: DC | PRN

## 2018-12-20 MED ORDER — ALUM & MAG HYDROXIDE-SIMETH 200-200-20 MG/5ML PO SUSP: 30.0000 mL | Freq: Four times a day (QID) | ORAL | Status: DC | PRN

## 2018-12-20 MED ORDER — FENTANYL CITRATE (PF) 100 MCG/2ML IJ SOLN
INTRAMUSCULAR | Status: AC
  Filled 2018-12-20: qty 2

## 2018-12-20 MED ORDER — MAGNESIUM HYDROXIDE 400 MG/5ML PO SUSP: 30.0000 mL | Freq: Every day | ORAL | Status: DC | PRN

## 2018-12-20 MED ORDER — ONDANSETRON HCL 4 MG/2ML IJ SOLN
INTRAMUSCULAR | Status: DC | PRN
  Administered 2018-12-20: 4 mg via INTRAVENOUS

## 2018-12-20 MED ORDER — CHLORHEXIDINE GLUCONATE CLOTH 2 % EX PADS: 6.0000 | MEDICATED_PAD | Freq: Once | CUTANEOUS | Status: DC

## 2018-12-20 MED ORDER — PHENOL 1.4 % MT LIQD: 1.0000 | OROMUCOSAL | Status: DC | PRN

## 2018-12-20 MED ORDER — VITAMIN D 25 MCG (1000 UNIT) PO TABS
1000.0000 [IU] | ORAL_TABLET | Freq: Every day | ORAL | Status: DC
  Administered 2018-12-21: 1000 [IU] via ORAL
  Filled 2018-12-20: qty 1

## 2018-12-20 MED ORDER — ACETAMINOPHEN 325 MG PO TABS: 650.0000 mg | ORAL_TABLET | ORAL | Status: DC | PRN

## 2018-12-20 MED ORDER — METHYLPREDNISOLONE ACETATE 80 MG/ML IJ SUSP
INTRAMUSCULAR | Status: DC | PRN
  Administered 2018-12-20: 80 mg

## 2018-12-20 MED ORDER — PROPOFOL 10 MG/ML IV BOLUS
INTRAVENOUS | Status: DC | PRN
  Administered 2018-12-20: 150 mg via INTRAVENOUS

## 2018-12-20 MED ORDER — FENTANYL CITRATE (PF) 250 MCG/5ML IJ SOLN
INTRAMUSCULAR | Status: DC | PRN
  Administered 2018-12-20: 50 ug via INTRAVENOUS
  Administered 2018-12-20 (×2): 25 ug via INTRAVENOUS
  Administered 2018-12-20 (×2): 50 ug via INTRAVENOUS

## 2018-12-20 MED ORDER — LACTATED RINGERS IV SOLN
INTRAVENOUS | Status: DC
  Administered 2018-12-20: 08:00:00 via INTRAVENOUS

## 2018-12-20 MED ORDER — HYDROCORTISONE NA SUCCINATE PF 100 MG IJ SOLR
INTRAMUSCULAR | Status: DC | PRN
  Administered 2018-12-20: 50 mg via INTRAVENOUS

## 2018-12-20 MED ORDER — PHENYLEPHRINE 40 MCG/ML (10ML) SYRINGE FOR IV PUSH (FOR BLOOD PRESSURE SUPPORT)
PREFILLED_SYRINGE | INTRAVENOUS | Status: DC | PRN
  Administered 2018-12-20: 80 ug via INTRAVENOUS
  Administered 2018-12-20: 120 ug via INTRAVENOUS

## 2018-12-20 MED ORDER — NITROGLYCERIN 0.4 MG SL SUBL: 0.4000 mg | SUBLINGUAL_TABLET | SUBLINGUAL | Status: DC | PRN

## 2018-12-20 MED ORDER — SODIUM CHLORIDE 0.9 % IV SOLN: 250.0000 mL | INTRAVENOUS | Status: DC

## 2018-12-20 MED ORDER — THROMBIN 5000 UNITS EX SOLR
CUTANEOUS | Status: AC
  Filled 2018-12-20: qty 5000

## 2018-12-20 MED ORDER — CYCLOBENZAPRINE HCL 10 MG PO TABS
10.0000 mg | ORAL_TABLET | Freq: Three times a day (TID) | ORAL | Status: DC | PRN
  Administered 2018-12-20 – 2018-12-21 (×2): 10 mg via ORAL
  Filled 2018-12-20 (×2): qty 1

## 2018-12-20 MED ORDER — AMLODIPINE BESYLATE 10 MG PO TABS
10.0000 mg | ORAL_TABLET | Freq: Every day | ORAL | Status: DC
  Administered 2018-12-21: 10 mg via ORAL
  Filled 2018-12-20: qty 1

## 2018-12-20 MED ORDER — ROCURONIUM BROMIDE 10 MG/ML (PF) SYRINGE
PREFILLED_SYRINGE | INTRAVENOUS | Status: DC | PRN
  Administered 2018-12-20: 50 mg via INTRAVENOUS

## 2018-12-20 MED ORDER — GABAPENTIN 300 MG PO CAPS
300.0000 mg | ORAL_CAPSULE | Freq: Four times a day (QID) | ORAL | Status: DC
  Administered 2018-12-20 – 2018-12-21 (×4): 300 mg via ORAL
  Filled 2018-12-20 (×4): qty 1

## 2018-12-20 MED ORDER — THROMBIN 20000 UNITS EX SOLR
CUTANEOUS | Status: AC
  Filled 2018-12-20: qty 20000

## 2018-12-20 MED ORDER — VANCOMYCIN HCL IN DEXTROSE 1-5 GM/200ML-% IV SOLN
1000.0000 mg | INTRAVENOUS | Status: AC
  Administered 2018-12-20: 08:00:00 1000 mg via INTRAVENOUS

## 2018-12-20 MED ORDER — ACETAMINOPHEN 10 MG/ML IV SOLN
INTRAVENOUS | Status: AC
  Filled 2018-12-20: qty 100

## 2018-12-20 MED ORDER — SODIUM CHLORIDE 0.9% FLUSH
3.0000 mL | Freq: Two times a day (BID) | INTRAVENOUS | Status: DC
  Administered 2018-12-20: 3 mL via INTRAVENOUS

## 2018-12-20 MED ORDER — BUPIVACAINE HCL (PF) 0.5 % IJ SOLN
INTRAMUSCULAR | Status: AC
  Filled 2018-12-20: qty 30

## 2018-12-20 MED ORDER — KCL IN DEXTROSE-NACL 20-5-0.45 MEQ/L-%-% IV SOLN: INTRAVENOUS | Status: DC

## 2018-12-20 MED ORDER — ONDANSETRON HCL 4 MG/2ML IJ SOLN: 4.0000 mg | Freq: Four times a day (QID) | INTRAMUSCULAR | Status: DC | PRN

## 2018-12-20 MED ORDER — SUCCINYLCHOLINE CHLORIDE 200 MG/10ML IV SOSY
PREFILLED_SYRINGE | INTRAVENOUS | Status: DC | PRN
  Administered 2018-12-20: 100 mg via INTRAVENOUS

## 2018-12-20 MED ORDER — LACTATED RINGERS IV SOLN
INTRAVENOUS | Status: DC | PRN
  Administered 2018-12-20 (×2): via INTRAVENOUS

## 2018-12-20 MED ORDER — SODIUM CHLORIDE (PF) 0.9 % IJ SOLN
INTRAMUSCULAR | Status: AC
  Filled 2018-12-20: qty 30

## 2018-12-20 MED ORDER — HYDROCORTISONE 10 MG PO TABS
20.0000 mg | ORAL_TABLET | Freq: Every morning | ORAL | Status: DC
  Administered 2018-12-21: 20 mg via ORAL
  Filled 2018-12-20: qty 2

## 2018-12-20 MED ORDER — MENTHOL 3 MG MT LOZG: 1.0000 | LOZENGE | OROMUCOSAL | Status: DC | PRN

## 2018-12-20 MED ORDER — GENTAMICIN IN SALINE 1.6-0.9 MG/ML-% IV SOLN
80.0000 mg | INTRAVENOUS | Status: DC
  Filled 2018-12-20: qty 50

## 2018-12-20 MED ORDER — MIDAZOLAM HCL 2 MG/2ML IJ SOLN
INTRAMUSCULAR | Status: AC
  Filled 2018-12-20: qty 2

## 2018-12-20 MED ORDER — HYDROCORTISONE 20 MG PO TABS
20.0000 mg | ORAL_TABLET | Freq: Once | ORAL | Status: AC
  Administered 2018-12-20: 20 mg via ORAL
  Filled 2018-12-20: qty 1

## 2018-12-20 MED ORDER — ZOLPIDEM TARTRATE 5 MG PO TABS: 5.0000 mg | ORAL_TABLET | Freq: Every day | ORAL | Status: DC

## 2018-12-20 MED ORDER — THROMBIN 20000 UNITS EX SOLR
CUTANEOUS | Status: DC | PRN
  Administered 2018-12-20: 10:00:00 via TOPICAL

## 2018-12-20 MED ORDER — SUGAMMADEX SODIUM 200 MG/2ML IV SOLN
INTRAVENOUS | Status: DC | PRN
  Administered 2018-12-20: 160 mg via INTRAVENOUS

## 2018-12-20 MED ORDER — SODIUM CHLORIDE 0.9 % IV SOLN
INTRAVENOUS | Status: DC | PRN
  Administered 2018-12-20: 09:00:00 15 ug/min via INTRAVENOUS

## 2018-12-20 MED ORDER — MORPHINE SULFATE (PF) 4 MG/ML IV SOLN: 4.0000 mg | INTRAVENOUS | Status: DC | PRN

## 2018-12-20 MED ORDER — LIDOCAINE 2% (20 MG/ML) 5 ML SYRINGE
INTRAMUSCULAR | Status: DC | PRN
  Administered 2018-12-20: 60 mg via INTRAVENOUS

## 2018-12-20 MED ORDER — HYDROCORTISONE 10 MG PO TABS: 20.0000 mg | ORAL_TABLET | Freq: Every morning | ORAL | Status: DC

## 2018-12-20 MED ORDER — PROPOFOL 500 MG/50ML IV EMUL
INTRAVENOUS | Status: DC | PRN
  Administered 2018-12-20: 25 ug/kg/min via INTRAVENOUS

## 2018-12-20 MED ORDER — HYDROMORPHONE HCL 1 MG/ML IJ SOLN
0.2500 mg | INTRAMUSCULAR | Status: DC | PRN
  Administered 2018-12-20 (×4): 0.5 mg via INTRAVENOUS

## 2018-12-20 MED ORDER — ATORVASTATIN CALCIUM 10 MG PO TABS
20.0000 mg | ORAL_TABLET | ORAL | Status: DC
  Administered 2018-12-20: 20 mg via ORAL
  Filled 2018-12-20: qty 2

## 2018-12-20 MED ORDER — VANCOMYCIN HCL IN DEXTROSE 1-5 GM/200ML-% IV SOLN
INTRAVENOUS | Status: AC
  Administered 2018-12-20: 1000 mg via INTRAVENOUS
  Filled 2018-12-20: qty 200

## 2018-12-20 MED ORDER — BUPIVACAINE HCL (PF) 0.5 % IJ SOLN
INTRAMUSCULAR | Status: DC | PRN
  Administered 2018-12-20: 20 mL

## 2018-12-20 MED ORDER — MIDAZOLAM HCL 5 MG/5ML IJ SOLN
INTRAMUSCULAR | Status: DC | PRN
  Administered 2018-12-20: 2 mg via INTRAVENOUS

## 2018-12-20 MED ORDER — LEVOTHYROXINE SODIUM 75 MCG PO TABS
75.0000 ug | ORAL_TABLET | Freq: Every day | ORAL | Status: DC
  Administered 2018-12-21: 75 ug via ORAL
  Filled 2018-12-20: qty 1

## 2018-12-20 MED ORDER — SCOPOLAMINE 1 MG/3DAYS TD PT72
MEDICATED_PATCH | TRANSDERMAL | Status: AC
  Filled 2018-12-20: qty 1

## 2018-12-20 MED ORDER — LIDOCAINE-EPINEPHRINE 1 %-1:100000 IJ SOLN
INTRAMUSCULAR | Status: DC | PRN
  Administered 2018-12-20: 20 mL

## 2018-12-20 MED ORDER — FLEET ENEMA 7-19 GM/118ML RE ENEM: 1.0000 | ENEMA | Freq: Once | RECTAL | Status: DC | PRN

## 2018-12-20 MED ORDER — ONDANSETRON HCL 4 MG PO TABS: 4.0000 mg | ORAL_TABLET | Freq: Four times a day (QID) | ORAL | Status: DC | PRN

## 2018-12-20 MED ORDER — KETOROLAC TROMETHAMINE 30 MG/ML IJ SOLN
INTRAMUSCULAR | Status: AC
  Filled 2018-12-20: qty 1

## 2018-12-20 MED ORDER — SCOPOLAMINE 1 MG/3DAYS TD PT72
MEDICATED_PATCH | TRANSDERMAL | Status: DC | PRN
  Administered 2018-12-20: 1 via TRANSDERMAL

## 2018-12-20 MED ORDER — HYDROCORTISONE 10 MG PO TABS
10.0000 mg | ORAL_TABLET | Freq: Every day | ORAL | Status: DC
  Administered 2018-12-20: 10 mg via ORAL
  Filled 2018-12-20: qty 1

## 2018-12-20 MED ORDER — GENTAMICIN SULFATE 40 MG/ML IJ SOLN
INTRAVENOUS | Status: DC | PRN
  Administered 2018-12-20: 80 mg via INTRAVENOUS

## 2018-12-20 MED ORDER — BISACODYL 10 MG RE SUPP: 10.0000 mg | Freq: Every day | RECTAL | Status: DC | PRN

## 2018-12-20 MED ORDER — FENTANYL CITRATE (PF) 100 MCG/2ML IJ SOLN
INTRAMUSCULAR | Status: DC | PRN
  Administered 2018-12-20: 100 ug via INTRAVENOUS

## 2018-12-20 MED ORDER — KETOROLAC TROMETHAMINE 30 MG/ML IJ SOLN
30.0000 mg | Freq: Four times a day (QID) | INTRAMUSCULAR | Status: DC
  Administered 2018-12-20 – 2018-12-21 (×3): 30 mg via INTRAVENOUS
  Filled 2018-12-20 (×2): qty 1

## 2018-12-20 MED ORDER — THROMBIN 5000 UNITS EX SOLR
OROMUCOSAL | Status: DC | PRN
  Administered 2018-12-20: 10:00:00 via TOPICAL

## 2018-12-20 MED ORDER — PROPOFOL 10 MG/ML IV BOLUS
INTRAVENOUS | Status: AC
  Filled 2018-12-20: qty 20

## 2018-12-20 MED ORDER — SODIUM CHLORIDE 0.9 % IV SOLN
INTRAVENOUS | Status: DC | PRN
  Administered 2018-12-20: 10:00:00

## 2018-12-20 SURGICAL SUPPLY — 59 items
ADH SKN CLS APL DERMABOND .7 (GAUZE/BANDAGES/DRESSINGS) ×1
APL SKNCLS STERI-STRIP NONHPOA (GAUZE/BANDAGES/DRESSINGS)
BAG DECANTER FOR FLEXI CONT (MISCELLANEOUS) ×2 IMPLANT
BENZOIN TINCTURE PRP APPL 2/3 (GAUZE/BANDAGES/DRESSINGS) IMPLANT
BLADE CLIPPER SURG (BLADE) IMPLANT
BUR ACRON 5.0MM COATED (BURR) ×1 IMPLANT
BUR MATCHSTICK NEURO 3.0 LAGG (BURR) ×2 IMPLANT
CANISTER SUCT 3000ML PPV (MISCELLANEOUS) ×2 IMPLANT
CARTRIDGE OIL MAESTRO DRILL (MISCELLANEOUS) ×1 IMPLANT
COVER WAND RF STERILE (DRAPES) ×2 IMPLANT
DECANTER SPIKE VIAL GLASS SM (MISCELLANEOUS) ×2 IMPLANT
DERMABOND ADVANCED (GAUZE/BANDAGES/DRESSINGS) ×1
DERMABOND ADVANCED .7 DNX12 (GAUZE/BANDAGES/DRESSINGS) ×1 IMPLANT
DIFFUSER DRILL AIR PNEUMATIC (MISCELLANEOUS) ×2 IMPLANT
DRAPE LAPAROTOMY 100X72X124 (DRAPES) ×2 IMPLANT
DRAPE MICROSCOPE LEICA (MISCELLANEOUS) ×2 IMPLANT
DRAPE POUCH INSTRU U-SHP 10X18 (DRAPES) ×2 IMPLANT
ELECT REM PT RETURN 9FT ADLT (ELECTROSURGICAL) ×2
ELECTRODE REM PT RTRN 9FT ADLT (ELECTROSURGICAL) ×1 IMPLANT
GAUZE 4X4 16PLY RFD (DISPOSABLE) IMPLANT
GAUZE SPONGE 4X4 12PLY STRL (GAUZE/BANDAGES/DRESSINGS) ×1 IMPLANT
GLOVE BIO SURGEON STRL SZ7 (GLOVE) ×1 IMPLANT
GLOVE BIO SURGEON STRL SZ8 (GLOVE) ×1 IMPLANT
GLOVE BIOGEL PI IND STRL 8 (GLOVE) ×1 IMPLANT
GLOVE BIOGEL PI INDICATOR 8 (GLOVE) ×4
GLOVE ECLIPSE 7.5 STRL STRAW (GLOVE) ×5 IMPLANT
GOWN STRL REUS W/ TWL LRG LVL3 (GOWN DISPOSABLE) IMPLANT
GOWN STRL REUS W/ TWL XL LVL3 (GOWN DISPOSABLE) ×1 IMPLANT
GOWN STRL REUS W/TWL 2XL LVL3 (GOWN DISPOSABLE) ×2 IMPLANT
GOWN STRL REUS W/TWL LRG LVL3 (GOWN DISPOSABLE)
GOWN STRL REUS W/TWL XL LVL3 (GOWN DISPOSABLE) ×4
HEMOSTAT POWDER KIT SURGIFOAM (HEMOSTASIS) ×1 IMPLANT
KIT BASIN OR (CUSTOM PROCEDURE TRAY) ×2 IMPLANT
KIT TURNOVER KIT B (KITS) ×2 IMPLANT
NDL HYPO 18GX1.5 BLUNT FILL (NEEDLE) IMPLANT
NDL SPNL 18GX3.5 QUINCKE PK (NEEDLE) ×1 IMPLANT
NDL SPNL 22GX3.5 QUINCKE BK (NEEDLE) ×1 IMPLANT
NEEDLE HYPO 18GX1.5 BLUNT FILL (NEEDLE) IMPLANT
NEEDLE SPNL 18GX3.5 QUINCKE PK (NEEDLE) ×2 IMPLANT
NEEDLE SPNL 22GX3.5 QUINCKE BK (NEEDLE) ×4 IMPLANT
NS IRRIG 1000ML POUR BTL (IV SOLUTION) ×2 IMPLANT
OIL CARTRIDGE MAESTRO DRILL (MISCELLANEOUS) ×2
PACK LAMINECTOMY NEURO (CUSTOM PROCEDURE TRAY) ×2 IMPLANT
PAD ARMBOARD 7.5X6 YLW CONV (MISCELLANEOUS) ×6 IMPLANT
PATTIES SURGICAL .5 X1 (DISPOSABLE) ×3 IMPLANT
RUBBERBAND STERILE (MISCELLANEOUS) ×4 IMPLANT
SPONGE LAP 4X18 RFD (DISPOSABLE) IMPLANT
SPONGE SURGIFOAM ABS GEL 100 (HEMOSTASIS) ×2 IMPLANT
STRIP CLOSURE SKIN 1/2X4 (GAUZE/BANDAGES/DRESSINGS) IMPLANT
SUT PROLENE 6 0 BV (SUTURE) IMPLANT
SUT VIC AB 1 CT1 18XBRD ANBCTR (SUTURE) ×1 IMPLANT
SUT VIC AB 1 CT1 8-18 (SUTURE) ×2
SUT VIC AB 2-0 CP2 18 (SUTURE) ×2 IMPLANT
SUT VIC AB 3-0 SH 8-18 (SUTURE) ×1 IMPLANT
SYR 5ML LL (SYRINGE) IMPLANT
TAPE CLOTH SURG 4X10 WHT LF (GAUZE/BANDAGES/DRESSINGS) ×1 IMPLANT
TOWEL GREEN STERILE (TOWEL DISPOSABLE) ×2 IMPLANT
TOWEL GREEN STERILE FF (TOWEL DISPOSABLE) ×2 IMPLANT
WATER STERILE IRR 1000ML POUR (IV SOLUTION) ×2 IMPLANT

## 2018-12-20 NOTE — Anesthesia Postprocedure Evaluation (Signed)
Anesthesia Post Note  Patient: [name]  Procedure(s) Performed: Left Lumbar Three-Four Lumbar Laminotomy and Foraminotomy, Lumbar Four-Five Laminotomy and Foraminotomy with Microdiscectomy and Resection of Synovial Cyst (Left Spine Lumbar)     Patient location during evaluation: PACU Anesthesia Type: General Level of consciousness: awake and alert Pain management: pain level controlled Vital Signs Assessment: post-procedure vital signs reviewed and stable Respiratory status: spontaneous breathing, nonlabored ventilation, respiratory function stable and patient connected to nasal cannula oxygen Cardiovascular status: blood pressure returned to baseline and stable Postop Assessment: no apparent nausea or vomiting Anesthetic complications: no    Last Vitals:  Vitals:   12/20/18 1544 12/20/18 1545  BP: (!) 94/57   Pulse: 74   Resp: (!) 8   Temp: 36.6 C 36.6 C  SpO2: 94%     Last Pain:  Vitals:   12/20/18 1545  TempSrc: Oral  PainSc:                  [name]

## 2018-12-20 NOTE — H&P (Signed)
Subjective: Patient is a 53 y.o. female who is admitted for treatment of lumbar radiculopathy secondary to a large left L4-5 synovial cyst and left L3-4 lateral recess stenosis.  Patient has been having difficulties for the past 4 and half months.  They have progressively worsened.  She describes pain from the left buttock into the left-sided low back and midline of the low back, with pain numbness and tingling extending through the left thigh and leg.  She has had a sense of weakness in the left lower extremity she has been requiring high-dose narcotics and gabapentin, but the pain is persisted.  She has been using a heating pad, and in fact burned her lateral left buttock hip and thigh.  Examination revealed weakness of the left dorsiflexor 4-4+ and left extensor hallucis longus 4-.  Patient is admitted now for left L3-4 lumbar laminotomy, foraminotomy, possible microdiscectomy, possible resection of synovial cyst, and left L4-5 lumbar laminotomy and resection of synovial cyst.   Patient Active Problem List   Diagnosis Date Noted  . Myositis 05/11/2016  . Anxiety 05/26/2015  . Tremors of nervous system 05/26/2015  . Neck pain 05/26/2015  . Hot flushes, perimenopausal 07/24/2013  . CAD (coronary artery disease) 04/11/2012  . GERD (gastroesophageal reflux disease) 04/11/2012  . Addison's disease ([place]) 03/03/2012  . QT prolongation 12/15/2010  . Palpitations 01/01/2009  . Hypothyroidism 12/31/2008  . Hyperlipidemia 12/31/2008  . OVERWEIGHT/OBESITY 12/31/2008  . Essential hypertension 12/31/2008   Past Medical History:  Diagnosis Date  . Addison's disease ([place])   . Adrenal insufficiency ([place])    diagnosed 2012  . Aneurysm ([place])   . Anxiety   . Arthritis   . Astigmatism   . CAD (coronary artery disease)    Cath 2008 EF normal. RCA 50-60, Septal 50%. Myoview 3/12: EF 53% normal perfusion  . Cardiac arrest ([place])    2/2 adissonian crisis  . Cardiomyopathy    resolved  . Chest pain     chronicc  . CHF (congestive heart failure) ([place])   . Chronic back pain   . Chronic diarrhea   . Concussion    sept 28th 2014  . Gastroparesis   . GERD (gastroesophageal reflux disease)   . HTN (hypertension)   . Hyperlipidemia   . Hypothyroidism   . Mitral valve prolapse   . Nondiabetic gastroparesis   . PONV (postoperative nausea and vomiting)   . QT prolongation   . Tobacco abuse    down to 2 cigarettes per day  . Vertigo     Past Surgical History:  Procedure Laterality Date  . ABDOMINAL HYSTERECTOMY    . CARDIAC CATHETERIZATION  03/2007   showed 60% lesion in the right coronary artery  . CHOLECYSTECTOMY    . LEFT HEART CATHETERIZATION WITH CORONARY ANGIOGRAM N/A 04/13/2012   Procedure: LEFT HEART CATHETERIZATION WITH CORONARY ANGIOGRAM;  Surgeon: Hillary Bow, MD;  Location: [facility] CATH LAB;  Service: Cardiovascular;  Laterality: N/A;  . SPINE SURGERY    . VARICOSE VEIN SURGERY    . VESICOVAGINAL FISTULA CLOSURE W/ TAH      Medications Prior to Admission  Medication Sig Dispense Refill Last Dose  . amLODipine (NORVASC) 10 MG tablet Take 10 mg by mouth daily.    12/19/2018 at Unknown time  . aspirin 81 MG chewable tablet Chew 81 mg by mouth daily.    Past Month at Unknown time  . atorvastatin (LIPITOR) 20 MG tablet Take 20 mg by mouth every morning.  12/19/2018 at Unknown time  . cholecalciferol (VITAMIN D) 1000 UNITS tablet Take 1,000 Units by mouth daily.   12/19/2018 at Unknown time  . cyclobenzaprine (FLEXERIL) 10 MG tablet Take 10 mg by mouth 3 (three) times daily as needed for muscle spasms.    12/19/2018 at Unknown time  . gabapentin (NEURONTIN) 300 MG capsule Take 300 mg by mouth 4 (four) times daily.    12/19/2018 at Unknown time  . hydrocortisone (CORTEF) 10 MG tablet Take 10-20 mg by mouth See admin instructions. Takes 20 mg in the morning and 10 mg at night   12/19/2018 at Unknown time  . levothyroxine (SYNTHROID, LEVOTHROID) 75 MCG tablet Take 75 mcg by mouth daily  before breakfast.   12/19/2018 at Unknown time  . meclizine (ANTIVERT) 25 MG tablet 1 or 2 tabs PO q8h prn dizziness (Patient taking differently: Take 25-50 mg by mouth 3 (three) times daily as needed (for vertigo). ) 15 tablet 0 12/17/2018  . ondansetron (ZOFRAN ODT) 4 MG disintegrating tablet Take 1 tablet (4 mg total) by mouth every 8 (eight) hours as needed for nausea. 6 tablet 0 Past Week at Unknown time  . oxyCODONE (ROXICODONE) 15 MG immediate release tablet Take 15-30 mg by mouth every 4 (four) hours as needed for pain.    12/19/2018 at Unknown time  . pantoprazole (PROTONIX) 20 MG tablet Take 1 tablet (20 mg total) by mouth daily. (Patient taking differently: Take 20 mg by mouth daily as needed for indigestion. ) 20 tablet 0 12/18/2018 at Unknown time  . zolpidem (AMBIEN) 10 MG tablet Take 10 mg by mouth at bedtime.   12/18/2018  . dicyclomine (BENTYL) 20 MG tablet Take 1 tablet (20 mg total) by mouth 2 (two) times daily as needed for spasms. (Patient not taking: Reported on 12/05/2018) 20 tablet 0 Not Taking  . furosemide (LASIX) 20 MG tablet Take 20 mg by mouth daily as needed for fluid.    More than a month at Unknown time  . hydrochlorothiazide (HYDRODIURIL) 12.5 MG tablet Take 6.25 mg by mouth daily as needed (for High Blood Pressure >145/98). Second line medication.   More than a month at Unknown time  . nitroGLYCERIN (NITROSTAT) 0.4 MG SL tablet Place 0.4 mg under the tongue every 5 (five) minutes as needed for chest pain.   More than a month at Unknown time  . oxyCODONE-acetaminophen (PERCOCET) 5-325 MG tablet Take 1 tablet by mouth every 4 (four) hours as needed. (Patient not taking: Reported on 09/08/2017) 20 tablet 0 Not Taking  . phenazopyridine (PYRIDIUM) 200 MG tablet Take 1 tablet (200 mg total) by mouth 3 (three) times daily as needed for pain. (Patient not taking: Reported on 12/05/2018) 12 tablet 0 Not Taking  . polyethylene glycol (MIRALAX / GLYCOLAX) packet Take 17 g by mouth daily as  needed for moderate constipation. (Patient not taking: Reported on 12/05/2018) 14 each 0 Not Taking  . promethazine (PHENERGAN) 25 MG suppository Place 1 suppository (25 mg total) rectally every 6 (six) hours as needed for nausea or vomiting. (Patient not taking: Reported on 12/05/2018) 12 each 0 Not Taking   Allergies  Allergen Reactions  . Bee Venom Anaphylaxis  . Doxycycline Other (See Comments) and Nausea Only    Due to Pre-Existing conditions involved with stomach, patient does not take the following medication  . Erythromycin Other (See Comments) and Nausea And Vomiting    Due to Pre-Existing conditions involved with stomach, patient does not take the following medication  .  Ibuprofen Nausea Only    gastroparesis   . Penicillins Anaphylaxis and Swelling    Has patient had a PCN reaction causing immediate rash, facial/tongue/throat swelling, SOB or lightheadedness with hypotension: Yes Has patient had a PCN reaction causing severe rash involving mucus membranes or skin necrosis: Yes Has patient had a PCN reaction that required hospitalization No Has patient had a PCN reaction occurring within the last 10 years: No If all of the above answers are "NO", then may proceed with Cephalosporin use.   . Sulfa Antibiotics Hives and Itching  . Cat Hair Extract Hives, Itching and Swelling    SWELLING REACTION UNSPECIFIED   . Dust Mite Extract Hives and Swelling  . Tramadol Diarrhea, Nausea And Vomiting and Nausea Only  . Morphine And Related Itching  . Sulfonamide Derivatives Itching    Social History   Tobacco Use  . Smoking status: Current Every Day Smoker    Packs/day: 0.25    Years: 10.00    Pack years: 2.50    Types: Cigarettes  . Smokeless tobacco: Never Used  . Tobacco comment: smokes 2 a day  Substance Use Topics  . Alcohol use: No    Family History  Problem Relation Age of Onset  . Cancer Father        Colon  . Coronary artery disease Other   . Heart attack Mother       Review of Systems Pertinent items noted in HPI and remainder of comprehensive ROS otherwise negative.  Objective: Vital signs in last 24 hours: Temp:  [98.8 F (37.1 C)] 98.8 F (37.1 C) (04/15 0700) Pulse Rate:  [83] 83 (04/15 0700) Resp:  [18] 18 (04/15 0700) BP: (114-115)/(62-86) 114/62 (04/15 0700) SpO2:  [98 %] 98 % (04/15 0700) Weight:  [79.8 kg] 79.8 kg (04/15 0654)  EXAM: Patient is a well-developed well-nourished white female in discomfort but no acute distress.   Lungs are clear to auscultation , the patient has symmetrical respiratory excursion. Heart has a regular rate and rhythm normal S1 and S2 no murmur.   Abdomen is soft nontender nondistended bowel sounds are present. Extremity examination shows no clubbing cyanosis or edema. Motor examination shows 5 over 5 strength in the right lower extremity including the iliopsoas quadriceps dorsiflexor extensor hallicus  longus and plantar flexor, but in the left lower extremity the iliopsoas quadriceps and plantar flexor 5, but the dorsiflexor is 4-4+ and the EHL is 4-. Sensation is decreased to pinprick in the medial aspect of the left leg, more so than in the medial aspect of the left foot. Reflexes examination shows a left quadriceps is 1, right quadriceps 2, gastrocnemius are absent bilaterally. No pathologic reflexes are present.  Gait and stance favor the left lower extremity.    Data Review:CBC    Component Value Date/Time   WBC 10.8 (H) 12/08/2018 1204   RBC 5.10 12/08/2018 1204   HGB 14.1 12/08/2018 1204   HCT 43.8 12/08/2018 1204   PLT 418 (H) 12/08/2018 1204   MCV 85.9 12/08/2018 1204   MCH 27.6 12/08/2018 1204   MCHC 32.2 12/08/2018 1204   RDW 14.2 12/08/2018 1204   LYMPHSABS 4.4 (H) 12/11/2017 0710   MONOABS 1.1 (H) 12/11/2017 0710   EOSABS 0.3 12/11/2017 0710   BASOSABS 0.0 12/11/2017 0710                          BMET    Component Value Date/Time   NA  136 12/08/2018 1204   K 4.0 12/08/2018 1204   CL  102 12/08/2018 1204   CO2 24 12/08/2018 1204   GLUCOSE 92 12/08/2018 1204   BUN 11 12/08/2018 1204   CREATININE 0.83 12/08/2018 1204   CALCIUM 9.0 12/08/2018 1204   GFRNONAA >60 12/08/2018 1204   GFRAA >60 12/08/2018 1204     Assessment/Plan: She with disabling left lumbar radiculopathy secondary to a moderately large left L4-5 synovial cyst causing significant left L4-5 lateral recess stenosis.  There is also left L3-4 lateral recess stenosis multifactorial nature, due to spondylitic overgrowth, possible synovial cyst, possible disc herniation.  Patient is admitted for decompression of both the left L3-4 and left L4-5 levels. I've discussed with the patient the nature of his condition, the nature the surgical procedure, the typical length of surgery, hospital stay, and overall recuperation. We discussed limitations postoperatively. I discussed risks of surgery including risks of infection, bleeding, possibly need for transfusion, the risk of nerve root dysfunction with pain, weakness, numbness, or paresthesias, or risk of dural tear and CSF leakage and possible need for further surgery, the risk of recurrent synovial cyst and/or disc herniation and the possible need for further surgery, and the risk of anesthetic complications including myocardial infarction, stroke, pneumonia, and death. Understanding all this the patient does wish to proceed with surgery and is admitted for such.    Hosie Spangle, MD 12/20/2018 8:26 AM

## 2018-12-20 NOTE — Progress Notes (Signed)
Pt has been NSR post surgery. Orders to D/C tele per Dr. Sherwood Gambler. Holli Humbles, RN

## 2018-12-20 NOTE — Anesthesia Procedure Notes (Signed)
Procedure Name: Intubation Date/Time: 12/20/2018 8:46 AM Performed by: Jearld Pies, CRNA Pre-anesthesia Checklist: Patient identified, Emergency Drugs available, Suction available and Patient being monitored Patient Re-evaluated:Patient Re-evaluated prior to induction Oxygen Delivery Method: Circle System Utilized Preoxygenation: Pre-oxygenation with 100% oxygen Induction Type: IV induction and Rapid sequence Laryngoscope Size: Mac and 3 Grade View: Grade I Tube type: Oral Tube size: 7.0 mm Number of attempts: 1 Airway Equipment and Method: Stylet and Oral airway Placement Confirmation: ETT inserted through vocal cords under direct vision,  positive ETCO2 and breath sounds checked- equal and bilateral Secured at: 20 cm Tube secured with: Tape Dental Injury: Teeth and Oropharynx as per pre-operative assessment

## 2018-12-20 NOTE — Transfer of Care (Signed)
Immediate Anesthesia Transfer of Care Note  Patient: [name]  Procedure(s) Performed: Left Lumbar Three-Four Lumbar Laminotomy and Foraminotomy, Lumbar Four-Five Laminotomy and Foraminotomy with Microdiscectomy and Resection of Synovial Cyst (Left Spine Lumbar)  Patient Location: PACU  Anesthesia Type:General  Level of Consciousness: awake, alert  and oriented  Airway & Oxygen Therapy: Patient Spontanous Breathing and Patient connected to face mask oxygen  Post-op Assessment: Report given to RN and Post -op Vital signs reviewed and stable  Post vital signs: Reviewed and stable  Last Vitals:  Vitals Value Taken Time  BP 113/60 12/20/2018 11:20 AM  Temp    Pulse 78 12/20/2018 11:23 AM  Resp 27 12/20/2018 11:23 AM  SpO2 100 % 12/20/2018 11:23 AM  Vitals shown include unvalidated device data.  Last Pain:  Vitals:   12/20/18 0700  TempSrc: Oral  PainSc:          Complications: No apparent anesthesia complications

## 2018-12-20 NOTE — Op Note (Addendum)
12/20/2018  11:08 AM  PATIENT:  [name]  53 y.o. female  PRE-OPERATIVE DIAGNOSIS: Left L3-4 and left L4-5 lateral recess stenosis, left L4-5 synovial cyst, left lumbar radiculopathy with weakness, left lower extremity weakness  POST-OPERATIVE DIAGNOSIS:  Left L3-4 and left L4-5 lateral recess stenosis, left L4-5 synovial cyst, left lumbar radiculopathy with weakness, left lower extremity weakness  PROCEDURE:  Procedure(s):  1)  Left Lumbar Four-Five Laminotomy, Medial Facetectomy, and Foraminotomy with Resection of Synovial Cyst with microdissection, microsurgical technique, and the operating microscope;  2)  Left Lumbar Three-Four Lumbar Laminotomy, Medial Facetectomy, and Foraminotomy with microdissection, microsurgical technique, and the operating microscope  SURGEON:  Surgeon(s): Jovita Gamma, MD  ASSISTANTS: Sherley Bounds, MD  ANESTHESIA:   general  EBL:  Total I/O In: 1000 [I.V.:1000] Out: 100 [Blood:100]  BLOOD ADMINISTERED:none  COUNT:  Correct per nursing staff  DICTATION: Patient was brought to the operating room and placed under general endotracheal anesthesia. Patient was turned to prone position the lumbar region was prepped with Betadine soap and solution and draped in a sterile fashion. The midline was infiltrated with local anesthetic with epinephrine. A localizing x-ray was taken and the L3-4 and L4-5 levels were identified. Midline incision was made over the 3 4 and L4-5 levels and was carried down through the subcutaneous tissue to the lumbar fascia. The lumbar fascia was incised on the left side and the paraspinal muscles were dissected from the spinous processes and lamina in a subperiosteal fashion. Another x-ray was taken and the L3-4 and L4-5 intralaminar spaces were identified. The operating microscope was draped and brought into the field provided additional magnification, illumination, and visualization. Laminotomy was performed at each level using the  high-speed drill and Kerrison punches. The ligamentum flavum was carefully resected.  A medial facetectomy was performed at each level using the high-speed drill and Kerrison punches.  The underlying thecal sac and nerve root were identified.  At L3-4 we found spondylitic bony and ligamentous overgrowth that was carefully removed, decompressing the thecal sac and exiting left L4 nerve root.  Foraminotomy was performed for the exiting left L4 nerve root.  At the L4-5 level there was a moderately large left L4-5 synovial cyst was encountered.  This was carefully mobilized from the thecal sac and exiting left L5 nerve root using microdissection and microsurgical technique.  Foraminotomy was performed for the exiting left L5 nerve root.  Once the decompression was completed at both levels, and good decompression of the thecal sac and nerve roots had been achieved hemostasis was established with the use of bipolar cautery and Gelfoam with thrombin. The Gelfoam was removed, a thin layer of Surgifoam applied, and hemostasis confirmed. We then instilled 2 cc of fentanyl and 80 mg of Depo-Medrol into the epidural space. Deep fascia was closed with interrupted undyed 1 Vicryl sutures. Scarpa's fascia was closed with interrupted undyed 1 Vicryl sutures in the subcutaneous and subcuticular layer were closed with interrupted inverted 2-0 undyed Vicryl sutures. The skin edges were approximated with Dermabond. Following surgery the patient was turned back to a supine position to be reversed from the anesthetic extubated and transferred to the recovery room for further care.  PLAN OF CARE: Admit for overnight observation  PATIENT DISPOSITION:  PACU - hemodynamically stable.   Delay start of Pharmacological VTE agent (>24hrs) due to surgical blood loss or risk of bleeding:  yes

## 2018-12-21 ENCOUNTER — Encounter (HOSPITAL_COMMUNITY): Payer: Self-pay | Admitting: Neurosurgery

## 2018-12-21 DIAGNOSIS — M48061 Spinal stenosis, lumbar region without neurogenic claudication: Secondary | ICD-10-CM | POA: Diagnosis not present

## 2018-12-21 DIAGNOSIS — I110 Hypertensive heart disease with heart failure: Secondary | ICD-10-CM | POA: Diagnosis not present

## 2018-12-21 DIAGNOSIS — M5416 Radiculopathy, lumbar region: Secondary | ICD-10-CM | POA: Diagnosis not present

## 2018-12-21 DIAGNOSIS — I2510 Atherosclerotic heart disease of native coronary artery without angina pectoris: Secondary | ICD-10-CM | POA: Diagnosis not present

## 2018-12-21 DIAGNOSIS — F17210 Nicotine dependence, cigarettes, uncomplicated: Secondary | ICD-10-CM | POA: Diagnosis not present

## 2018-12-21 DIAGNOSIS — M1990 Unspecified osteoarthritis, unspecified site: Secondary | ICD-10-CM | POA: Diagnosis not present

## 2018-12-21 DIAGNOSIS — M7138 Other bursal cyst, other site: Secondary | ICD-10-CM | POA: Diagnosis not present

## 2018-12-21 DIAGNOSIS — E271 Primary adrenocortical insufficiency: Secondary | ICD-10-CM | POA: Diagnosis not present

## 2018-12-21 DIAGNOSIS — I509 Heart failure, unspecified: Secondary | ICD-10-CM | POA: Diagnosis not present

## 2018-12-21 NOTE — Discharge Instructions (Signed)
Wound Care Leave incision open to air. You may shower. Do not scrub directly on incision.  Do not put any creams, lotions, or ointments on incision. Activity Walk each and every day, increasing distance each day. No lifting greater than 5 lbs.  Avoid bending, arching, and twisting. No driving for 2 weeks; may ride as a passenger locally. If provided with back brace, wear when out of bed.  It is not necessary to wear in bed. Diet Resume your normal diet.  Return to Work Will be discussed at you follow up appointment. Call Your Doctor If Any of These Occur Redness, drainage, or swelling at the wound.  Temperature greater than 101 degrees. Severe pain not relieved by pain medication. Incision starts to come apart. Follow Up Appt Call today for appointment in 3 weeks ([phone]) or for problems.  If you have any hardware placed in your spine, you will need an x-ray before your appointment.    Laminectomy, Care After This sheet gives you information about how to care for yourself after your procedure. Your health care provider may also give you more specific instructions. If you have problems or questions, contact your health care provider. What can I expect after the procedure? After the procedure, it is common to have:  Some pain around your incision area.  Muscle tightening (spasms) across the back. Follow these instructions at home: Incision care   Follow instructions from your health care provider about how to take care of your incision area. Make sure you: ? Wash your hands with soap and water before and after you apply medicine to the area or change your bandage (dressing). If soap and water are not available, use hand sanitizer. ? Change your dressing as told by your health care provider. ? Leave stitches (sutures), skin glue, or adhesive strips in place. These skin closures may need to stay in place for 2 weeks or longer. If adhesive strip edges start to loosen and curl up,  you may trim the loose edges. Do not remove adhesive strips completely unless your health care provider tells you to do that.  Check your incision area every day for signs of infection. Check for: ? More redness, swelling, or pain. ? More fluid or blood. ? Warmth. ? Pus or a bad smell. Medicines  Take over-the-counter and prescription medicines only as told by your health care provider.  If you were prescribed an antibiotic medicine, use it as told by your health care provider. Do not stop using the antibiotic even if you start to feel better. Bathing  Do not take baths, swim, or use a hot tub for 2 weeks, or until your incision has healed completely.  If your health care provider approves, you may take showers after your dressing has been removed. Activity   Return to your normal activities as told by your health care provider. Ask your health care provider what activities are safe for you.  Avoid bending or twisting at your waist. Always bend at your knees.  Do not sit for more than 20-30 minutes at a time. Lie down or walk between periods of sitting.  Do not lift anything that is heavier than 10 lb (4.5 kg) or the limit that your health care provider tells you, until he or she says that it is safe.  Do not drive for 2 weeks after your procedure or for as long as your health care provider tells you.  Do not drive or use heavy machinery while taking prescription  pain medicine. General instructions  To prevent or treat constipation while you are taking prescription pain medicine, your health care provider may recommend that you: ? Drink enough fluid to keep your urine clear or pale yellow. ? Take over-the-counter or prescription medicines. ? Eat foods that are high in fiber, such as fresh fruits and vegetables, whole grains, and beans. ? Limit foods that are high in fat and processed sugars, such as fried and sweet foods.  Do breathing exercises as told.  Keep all follow-up  visits as told by your health care provider. This is important. Contact a health care provider if:  You have more redness, swelling, or pain around your incision area.  Your incision feels warm to the touch.  You are not able to return to activities or do exercises as told by your health care provider. Get help right away if:  You have: ? More fluid or blood coming from your incision area. ? Pus or a bad smell coming from your incision area. ? Chills or a fever. ? Episodes of dizziness or fainting while standing.  You develop a rash.  You develop shortness of breath or you have difficulty breathing.  You cannot control when you urinate or have a bowel movement.  You become weak.  You are not able to use your legs. Summary  After the procedure, it is common to have some pain around your incision area. You may also have muscle tightening (spasms) across the back.  Follow instructions from your health care provider about how to care for your incision.  Do not lift anything that is heavier than 10 lb (4.5 kg) or the limit that your health care provider tells you, until he or she says that it is safe.  Contact your health care provider if you have more redness, swelling, or pain around your incision area or if your incision feels warm to the touch. These can be signs of infection. This information is not intended to replace advice given to you by your health care provider. Make sure you discuss any questions you have with your health care provider. Document Released: 03/12/2005 Document Revised: 04/08/2016 Document Reviewed: 02/08/2016 Elsevier Interactive Patient Education  2019 [employer].

## 2018-12-21 NOTE — Progress Notes (Signed)
Pt doing well. Pt given D/C instructions with verbal understanding. Rx's were sent to Pt's pharmacy by MD. Pt's incision is clean and dry with no sign of infection. Pt's IV was removed prior to D/C. Pt D/C'd home via wheelchair @ 1020 per MD order. Pt is stable @ D/C and has no other needs at this time. Britton Perkinson, RN  

## 2018-12-21 NOTE — Discharge Summary (Signed)
Physician Discharge Summary  [name] MRN: [id number] DOB/AGE: 1966-06-03 53 y.o.  Admit date: 12/20/2018 Discharge date: 12/21/2018  Admission Diagnoses:  Left L3-4 and left L4-5 lateral recess stenosis, left L4-5 synovial cyst, left lumbar radiculopathy with weakness, left lower extremity weakness  Discharge DiagnosLeft L3-4 and left L4-5 lateral recess stenosis, left L4-5 synovial cyst, left lumbar radiculopathy with weakness, left lower extremity weaknesses:   Active Problems:   Synovial cyst of lumbar spine   Discharged Condition: good  Hospital Course: Patient was admitted, underwent left L3-4 and left L4-5 decompression.  She has done well following surgery.  She is up and ambulating actively.  Her incision is healing nicely.  There is no erythema, ecchymosis, swelling, or drainage.  She is being discharged home with instructions regarding wound care and activities.  She is scheduled for follow-up with me in the office in 3 weeks.  Discharge Exam: Blood pressure 137/78, pulse 77, temperature 97.8 F (36.6 C), temperature source Oral, resp. rate 16, height 5\' 5"  (1.651 m), weight 79.8 kg, SpO2 98 %.  Disposition: Discharge disposition: 01-Home or Self Care     Home  Discharge Instructions    Discharge wound care:   Complete by:  As directed    Leave the wound open to air. Shower daily with the wound uncovered. Water and soapy water should run over the incision area. Do not wash directly on the incision for 2 weeks. Remove the glue after 2 weeks.   Driving Restrictions   Complete by:  As directed    No driving for 2 weeks. May ride in the car locally now. May begin to drive locally in 2 weeks.   Other Restrictions   Complete by:  As directed    Walk gradually increasing distances out in the fresh air at least twice a day. Walking additional 6 times inside the house, gradually increasing distances, daily. No bending, lifting, or twisting. Perform activities  between shoulder and waist height (that is at counter height when standing or table height when sitting).     Allergies as of 12/21/2018      Reactions   Bee Venom Anaphylaxis   Doxycycline Other (See Comments), Nausea Only   Due to Pre-Existing conditions involved with stomach, patient does not take the following medication   Erythromycin Other (See Comments), Nausea And Vomiting   Due to Pre-Existing conditions involved with stomach, patient does not take the following medication   Ibuprofen Nausea Only   gastroparesis   Penicillins Anaphylaxis, Swelling   Has patient had a PCN reaction causing immediate rash, facial/tongue/throat swelling, SOB or lightheadedness with hypotension: Yes Has patient had a PCN reaction causing severe rash involving mucus membranes or skin necrosis: Yes Has patient had a PCN reaction that required hospitalization No Has patient had a PCN reaction occurring within the last 10 years: No If all of the above answers are "NO", then may proceed with Cephalosporin use.   Sulfa Antibiotics Hives, Itching   Cat Hair Extract Hives, Itching, Swelling   SWELLING REACTION UNSPECIFIED    Dust Mite Extract Hives, Swelling   Tramadol Diarrhea, Nausea And Vomiting, Nausea Only   Morphine And Related Itching   Sulfonamide Derivatives Itching      Medication List    STOP taking these medications   dicyclomine 20 MG tablet Commonly known as:  BENTYL   oxyCODONE 15 MG immediate release tablet Commonly known as:  ROXICODONE   oxyCODONE-acetaminophen 5-325 MG tablet Commonly known as:  Percocet   phenazopyridine 200 MG tablet Commonly known as:  Pyridium   polyethylene glycol 17 g packet Commonly known as:  MIRALAX / GLYCOLAX   promethazine 25 MG suppository Commonly known as:  PHENERGAN     TAKE these medications   amLODipine 10 MG tablet Commonly known as:  NORVASC Take 10 mg by mouth daily.   aspirin 81 MG chewable tablet Chew 81 mg by mouth daily.    atorvastatin 20 MG tablet Commonly known as:  LIPITOR Take 20 mg by mouth every morning.   cholecalciferol 1000 units tablet Commonly known as:  VITAMIN D Take 1,000 Units by mouth daily.   cyclobenzaprine 10 MG tablet Commonly known as:  FLEXERIL Take 10 mg by mouth 3 (three) times daily as needed for muscle spasms.   furosemide 20 MG tablet Commonly known as:  LASIX Take 20 mg by mouth daily as needed for fluid.   gabapentin 300 MG capsule Commonly known as:  NEURONTIN Take 300 mg by mouth 4 (four) times daily.   hydrochlorothiazide 12.5 MG tablet Commonly known as:  HYDRODIURIL Take 6.25 mg by mouth daily as needed (for High Blood Pressure >145/98). Second line medication.   hydrocortisone 10 MG tablet Commonly known as:  CORTEF Take 10-20 mg by mouth See admin instructions. Takes 20 mg in the morning and 10 mg at night   levothyroxine 75 MCG tablet Commonly known as:  SYNTHROID, LEVOTHROID Take 75 mcg by mouth daily before breakfast.   meclizine 25 MG tablet Commonly known as:  ANTIVERT 1 or 2 tabs PO q8h prn dizziness What changed:    how much to take  how to take this  when to take this  reasons to take this  additional instructions   nitroGLYCERIN 0.4 MG SL tablet Commonly known as:  NITROSTAT Place 0.4 mg under the tongue every 5 (five) minutes as needed for chest pain.   ondansetron 4 MG disintegrating tablet Commonly known as:  Zofran ODT Take 1 tablet (4 mg total) by mouth every 8 (eight) hours as needed for nausea.   pantoprazole 20 MG tablet Commonly known as:  PROTONIX Take 1 tablet (20 mg total) by mouth daily. What changed:    when to take this  reasons to take this   zolpidem 10 MG tablet Commonly known as:  AMBIEN Take 10 mg by mouth at bedtime.            Discharge Care Instructions  (From admission, onward)         Start     Ordered   12/21/18 0000  Discharge wound care:    Comments:  Leave the wound open to air.  Shower daily with the wound uncovered. Water and soapy water should run over the incision area. Do not wash directly on the incision for 2 weeks. Remove the glue after 2 weeks.   12/21/18 0914           Signed: Hosie Spangle 12/21/2018, 9:15 AM

## 2019-02-14 ENCOUNTER — Encounter: Payer: Self-pay | Admitting: Orthopedic Surgery

## 2019-03-05 ENCOUNTER — Encounter: Payer: Self-pay | Admitting: Orthopedic Surgery

## 2019-03-05 ENCOUNTER — Other Ambulatory Visit: Payer: Self-pay

## 2019-03-05 ENCOUNTER — Ambulatory Visit (INDEPENDENT_AMBULATORY_CARE_PROVIDER_SITE_OTHER): Payer: Medicare HMO | Admitting: Orthopedic Surgery

## 2019-03-05 ENCOUNTER — Ambulatory Visit (INDEPENDENT_AMBULATORY_CARE_PROVIDER_SITE_OTHER): Payer: Medicare HMO

## 2019-03-05 VITALS — BP 137/85 | HR 111 | Temp 99.0°F | Ht 65.0 in | Wt 172.0 lb

## 2019-03-05 DIAGNOSIS — M25562 Pain in left knee: Secondary | ICD-10-CM

## 2019-03-05 DIAGNOSIS — G8929 Other chronic pain: Secondary | ICD-10-CM | POA: Diagnosis not present

## 2019-03-05 NOTE — Progress Notes (Signed)
[name]  03/05/2019  HISTORY SECTION :  Chief Complaint  Patient presents with  . Knee Pain    Lt knee for 3 months. Baker's cyst found, pt c/o swelling and weaknesss. Has had recent back surgery.   HPI The patient presents for evaluation of her left knee.  She had a back operation back in April seem to have good relief of sciatic nerve pain but then her back leg and knee all started hurting again.  She started having pain in the back of the knee which then became diffuse knee pain with intense calf pain and swelling and pain behind the knee.  She had an ultrasound diagnosed with a Baker's cyst comes in with aching pain occasional knifelike sensation running through the knee with no history of trauma no catching locking or giving way   Review of Systems  HENT: Positive for tinnitus.   Eyes: Positive for blurred vision.  Gastrointestinal: Positive for nausea.  Musculoskeletal: Positive for back pain, joint pain and neck pain.  Neurological: Positive for dizziness and tingling.  All other systems reviewed and are negative.    Past Medical History:  Diagnosis Date  . Addison's disease ([place])   . Adrenal insufficiency ([place])    diagnosed 2012  . Aneurysm ([place])   . Anxiety   . Arthritis   . Astigmatism   . CAD (coronary artery disease)    Cath 2008 EF normal. RCA 50-60, Septal 50%. Myoview 3/12: EF 53% normal perfusion  . Cardiac arrest ([place])    2/2 adissonian crisis  . Cardiomyopathy    resolved  . Chest pain    chronicc  . CHF (congestive heart failure) ([place])   . Chronic back pain   . Chronic diarrhea   . Concussion    sept 28th 2014  . Gastroparesis   . GERD (gastroesophageal reflux disease)   . HTN (hypertension)   . Hyperlipidemia   . Hypothyroidism   . Mitral valve prolapse   . Nondiabetic gastroparesis   . PONV (postoperative nausea and vomiting)   . QT prolongation   . Tobacco abuse    down to 2 cigarettes per day  . Vertigo     Past Surgical  History:  Procedure Laterality Date  . ABDOMINAL HYSTERECTOMY    . CARDIAC CATHETERIZATION  03/2007   showed 60% lesion in the right coronary artery  . CHOLECYSTECTOMY    . LEFT HEART CATHETERIZATION WITH CORONARY ANGIOGRAM N/A 04/13/2012   Procedure: LEFT HEART CATHETERIZATION WITH CORONARY ANGIOGRAM;  Surgeon: Hillary Bow, MD;  Location: [facility] CATH LAB;  Service: Cardiovascular;  Laterality: N/A;  . LUMBAR LAMINECTOMY/DECOMPRESSION MICRODISCECTOMY Left 12/20/2018   Procedure: Left Lumbar Three-Four Lumbar Laminotomy and Foraminotomy, Lumbar Four-Five Laminotomy and Foraminotomy with Microdiscectomy and Resection of Synovial Cyst;  Surgeon: Jovita Gamma, MD;  Location: [facility];  Service: Neurosurgery;  Laterality: Left;  Left Lumbar 3-4 Lumbar laminotomy, foraminotomy, possible microdiscectomy with possible resection of synovial cyst  . SPINE SURGERY    . VARICOSE VEIN SURGERY    . VESICOVAGINAL FISTULA CLOSURE W/ TAH       Allergies  Allergen Reactions  . Bee Venom Anaphylaxis  . Doxycycline Other (See Comments) and Nausea Only    Due to Pre-Existing conditions involved with stomach, patient does not take the following medication  . Erythromycin Other (See Comments) and Nausea And Vomiting    Due to Pre-Existing conditions involved with stomach, patient does not take the following medication  . Ibuprofen Nausea  Only    gastroparesis   . Penicillins Anaphylaxis and Swelling    Has patient had a PCN reaction causing immediate rash, facial/tongue/throat swelling, SOB or lightheadedness with hypotension: Yes Has patient had a PCN reaction causing severe rash involving mucus membranes or skin necrosis: Yes Has patient had a PCN reaction that required hospitalization No Has patient had a PCN reaction occurring within the last 10 years: No If all of the above answers are "NO", then may proceed with Cephalosporin use.   . Sulfa Antibiotics Hives and Itching  . Cat Hair Extract Hives,  Itching and Swelling    SWELLING REACTION UNSPECIFIED   . Dust Mite Extract Hives and Swelling  . Tramadol Diarrhea, Nausea And Vomiting and Nausea Only  . Morphine And Related Itching  . Sulfonamide Derivatives Itching     Current Outpatient Medications:  .  amLODipine (NORVASC) 10 MG tablet, Take 10 mg by mouth daily. , Disp: , Rfl:  .  aspirin 81 MG chewable tablet, Chew 81 mg by mouth daily. , Disp: , Rfl:  .  atorvastatin (LIPITOR) 20 MG tablet, Take 20 mg by mouth every morning. , Disp: , Rfl:  .  cholecalciferol (VITAMIN D) 1000 UNITS tablet, Take 1,000 Units by mouth daily., Disp: , Rfl:  .  cyclobenzaprine (FLEXERIL) 10 MG tablet, Take 10 mg by mouth 3 (three) times daily as needed for muscle spasms. , Disp: , Rfl:  .  furosemide (LASIX) 20 MG tablet, Take 20 mg by mouth daily as needed for fluid. , Disp: , Rfl:  .  gabapentin (NEURONTIN) 300 MG capsule, Take 300 mg by mouth 4 (four) times daily. , Disp: , Rfl:  .  hydrochlorothiazide (HYDRODIURIL) 12.5 MG tablet, Take 6.25 mg by mouth daily as needed (for High Blood Pressure >145/98). Second line medication., Disp: , Rfl:  .  hydrocortisone (CORTEF) 10 MG tablet, Take 10-20 mg by mouth See admin instructions. Takes 20 mg in the morning and 10 mg at night, Disp: , Rfl:  .  levothyroxine (SYNTHROID, LEVOTHROID) 75 MCG tablet, Take 75 mcg by mouth daily before breakfast., Disp: , Rfl:  .  meclizine (ANTIVERT) 25 MG tablet, 1 or 2 tabs PO q8h prn dizziness (Patient taking differently: Take 25-50 mg by mouth 3 (three) times daily as needed (for vertigo). ), Disp: 15 tablet, Rfl: 0 .  nitroGLYCERIN (NITROSTAT) 0.4 MG SL tablet, Place 0.4 mg under the tongue every 5 (five) minutes as needed for chest pain., Disp: , Rfl:  .  ondansetron (ZOFRAN ODT) 4 MG disintegrating tablet, Take 1 tablet (4 mg total) by mouth every 8 (eight) hours as needed for nausea., Disp: 6 tablet, Rfl: 0 .  pantoprazole (PROTONIX) 20 MG tablet, Take 1 tablet (20 mg  total) by mouth daily. (Patient taking differently: Take 20 mg by mouth daily as needed for indigestion. ), Disp: 20 tablet, Rfl: 0 .  zolpidem (AMBIEN) 10 MG tablet, Take 10 mg by mouth at bedtime., Disp: , Rfl:    PHYSICAL EXAM SECTION: 1) BP 137/85   Pulse (!) 111   Temp 99 F (37.2 C)   Ht 5\' 5"  (1.651 m)   Wt 172 lb (78 kg)   BMI 28.62 kg/m   Body mass index is 28.62 kg/m. General appearance: Well-developed well-nourished no gross deformities  2) Cardiovascular normal pulse and perfusion in all 4 extremities normal color without edema  3) Neurologically deep tendon reflexes are equal and normal, no sensation loss or deficits no pathologic  reflexes  4) Psychological: Awake alert and oriented x3 mood and affect normal  5) Skin no lacerations or ulcerations no nodularity no palpable masses, no erythema or nodularity  6) Musculoskeletal:   Right knee normal alignment normal skin normal range of motion ligaments stable muscle tone normal  Left knee she has pain in the back of her leg in the popliteal fossa distal thigh and proximal tibia  She also has some tenderness in the front of the knee on both sides of the joint line with a negative McMurray sign and stable ligaments skin is warm dry is intact there is no atrophy   MEDICAL DECISION SECTION:  Encounter Diagnosis  Name Primary?  . Chronic pain of left knee with Baker's cyst Yes    Imaging [facility] imaging shows no fracture dislocation or significant degenerative changes  See dictated report  Plan:  (Rx., Inj., surg., Frx, MRI/CT, XR:2)  Left knee joint injection follow-up 4 to 6 weeks if no improvement repeat ultrasound with aspiration injection at the hospital OR [facility]  Procedure note left knee injection   verbal consent was obtained to inject left knee joint  Timeout was completed to confirm the site of injection  The medications used were 40 mg of Depo-Medrol and 1% lidocaine 3  cc  Anesthesia was provided by ethyl chloride and the skin was prepped with alcohol.  After cleaning the skin with alcohol a 20-gauge needle was used to inject the left knee joint. There were no complications. A sterile bandage was applied.    4:16 PM Arther Abbott, MD 03/05/2019

## 2019-03-15 DIAGNOSIS — G894 Chronic pain syndrome: Secondary | ICD-10-CM | POA: Diagnosis not present

## 2019-03-15 DIAGNOSIS — M48061 Spinal stenosis, lumbar region without neurogenic claudication: Secondary | ICD-10-CM | POA: Diagnosis not present

## 2019-03-15 DIAGNOSIS — M1991 Primary osteoarthritis, unspecified site: Secondary | ICD-10-CM | POA: Diagnosis not present

## 2019-03-28 ENCOUNTER — Other Ambulatory Visit (HOSPITAL_COMMUNITY): Payer: Self-pay | Admitting: Internal Medicine

## 2019-03-28 DIAGNOSIS — Z1231 Encounter for screening mammogram for malignant neoplasm of breast: Secondary | ICD-10-CM

## 2019-03-28 DIAGNOSIS — R1011 Right upper quadrant pain: Secondary | ICD-10-CM | POA: Diagnosis not present

## 2019-03-28 DIAGNOSIS — R1010 Upper abdominal pain, unspecified: Secondary | ICD-10-CM | POA: Diagnosis not present

## 2019-03-28 DIAGNOSIS — K5900 Constipation, unspecified: Secondary | ICD-10-CM | POA: Diagnosis not present

## 2019-03-28 DIAGNOSIS — K2960 Other gastritis without bleeding: Secondary | ICD-10-CM | POA: Diagnosis not present

## 2019-03-31 IMAGING — DX DG KNEE COMPLETE 4+V*R*
4 series · 4 of 4 positions shown · non-contrast
Comparison: 03/29/2014

CLINICAL DATA: Right knee pain x3 weeks. Patient states that it
feels like things are tearing and popping in her knee. Patient
states injury 3 years ago where she fell and "popped her knee cap
out of place and then back in". Patient now using a cane.

EXAM:
RIGHT KNEE - COMPLETE 4+ VIEW

[knee ap (1 of 3)]
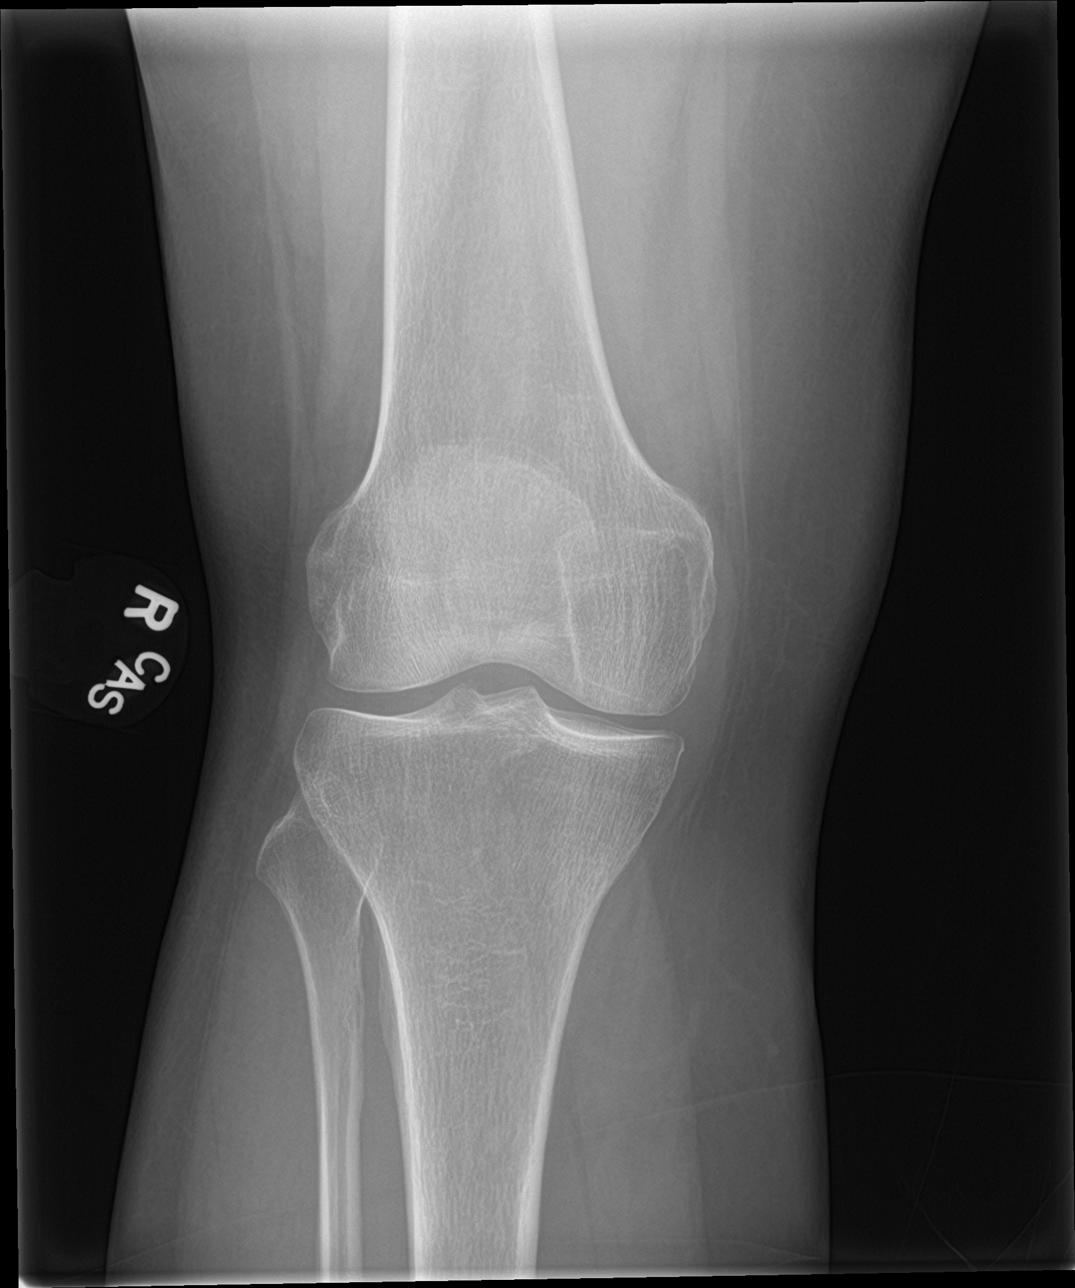

[knee lat]
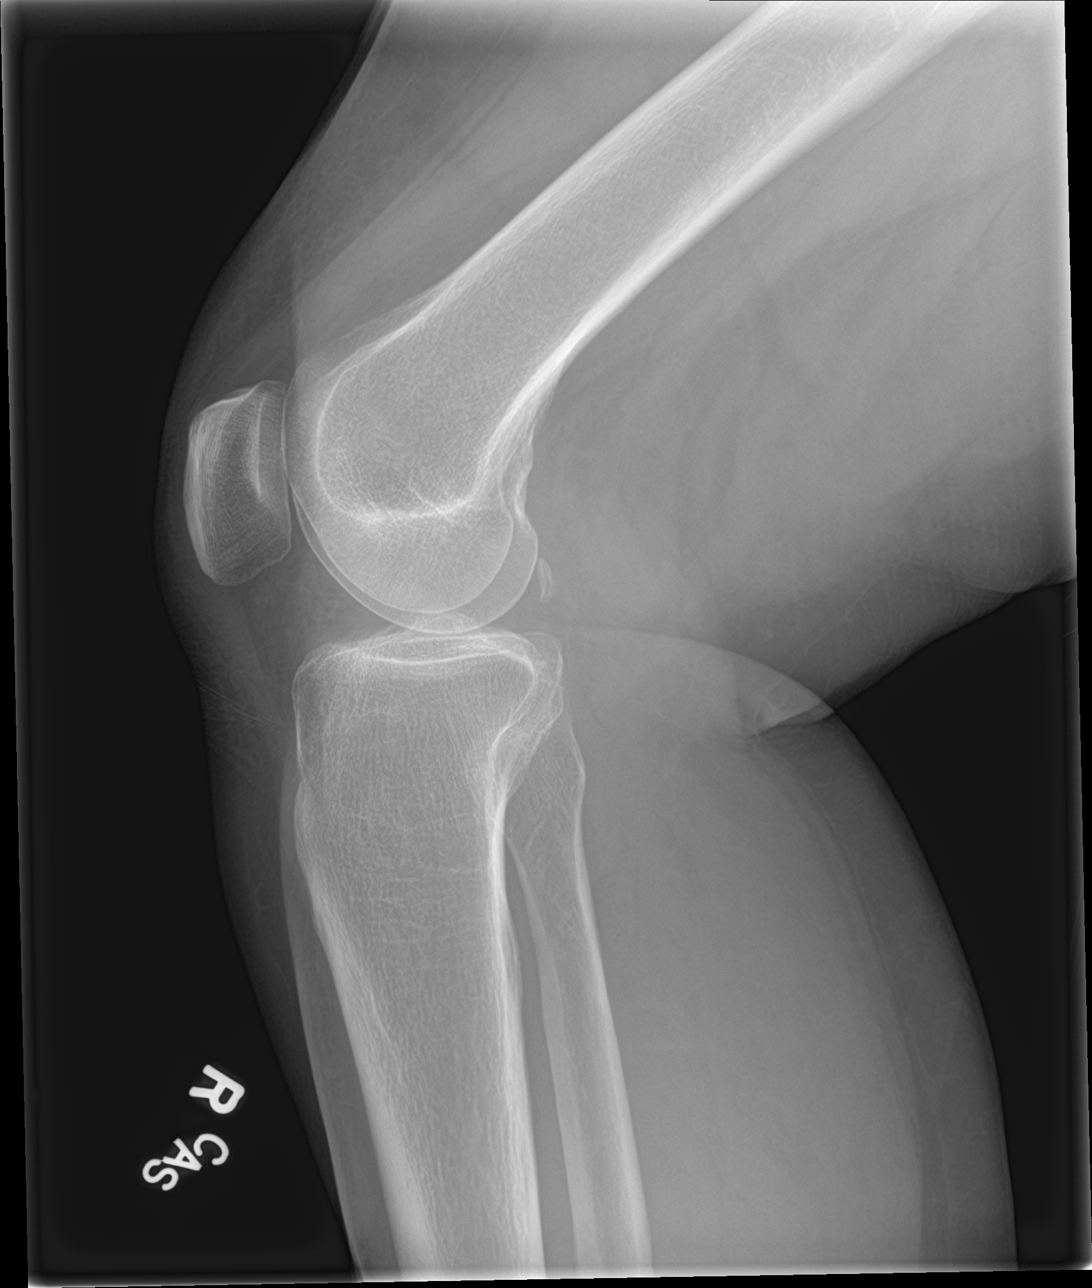

[knee ap (2 of 3)]
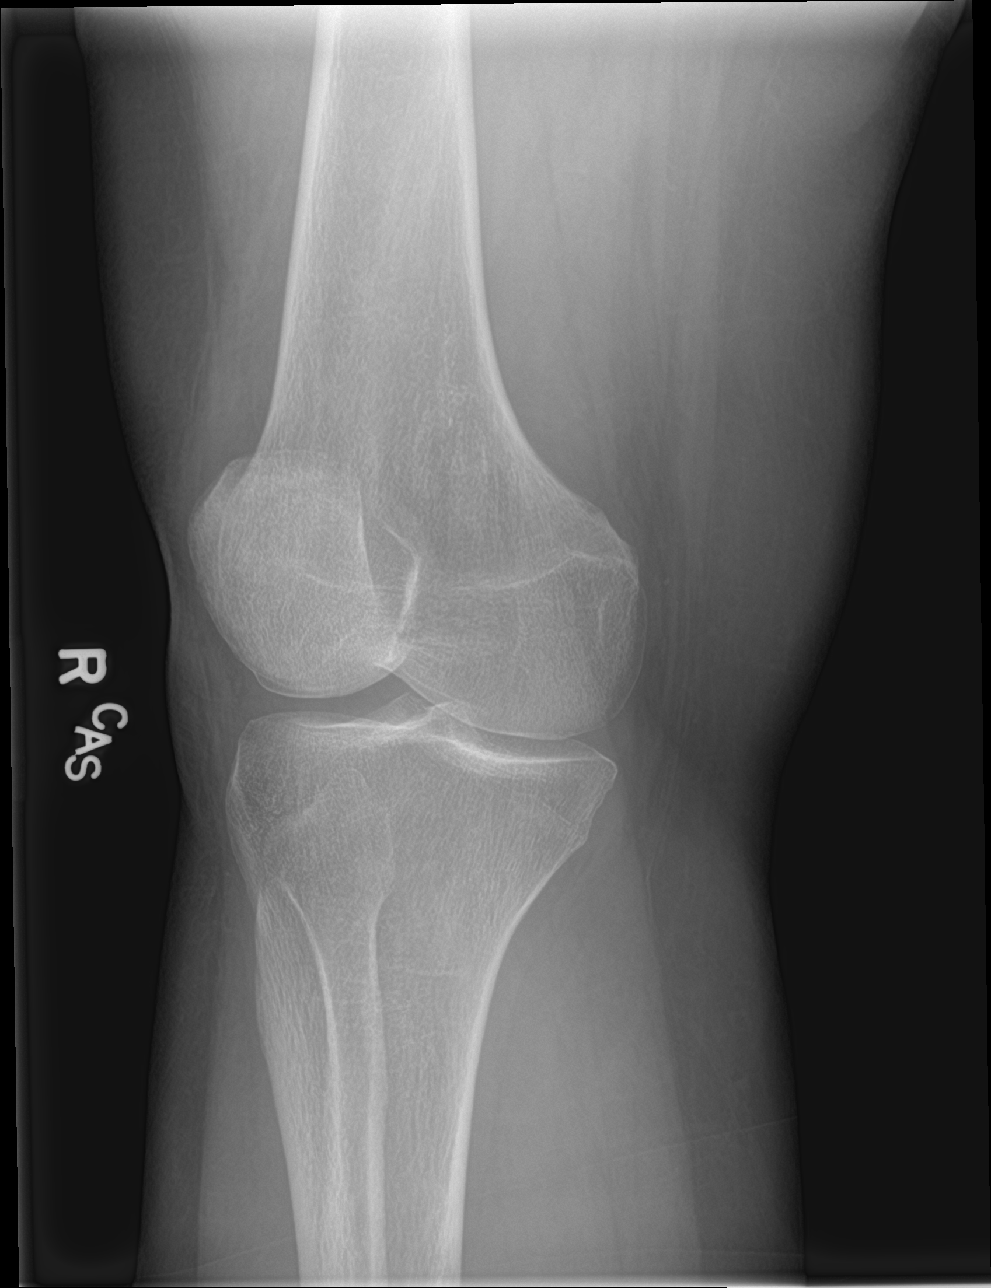

[knee ap (3 of 3)]
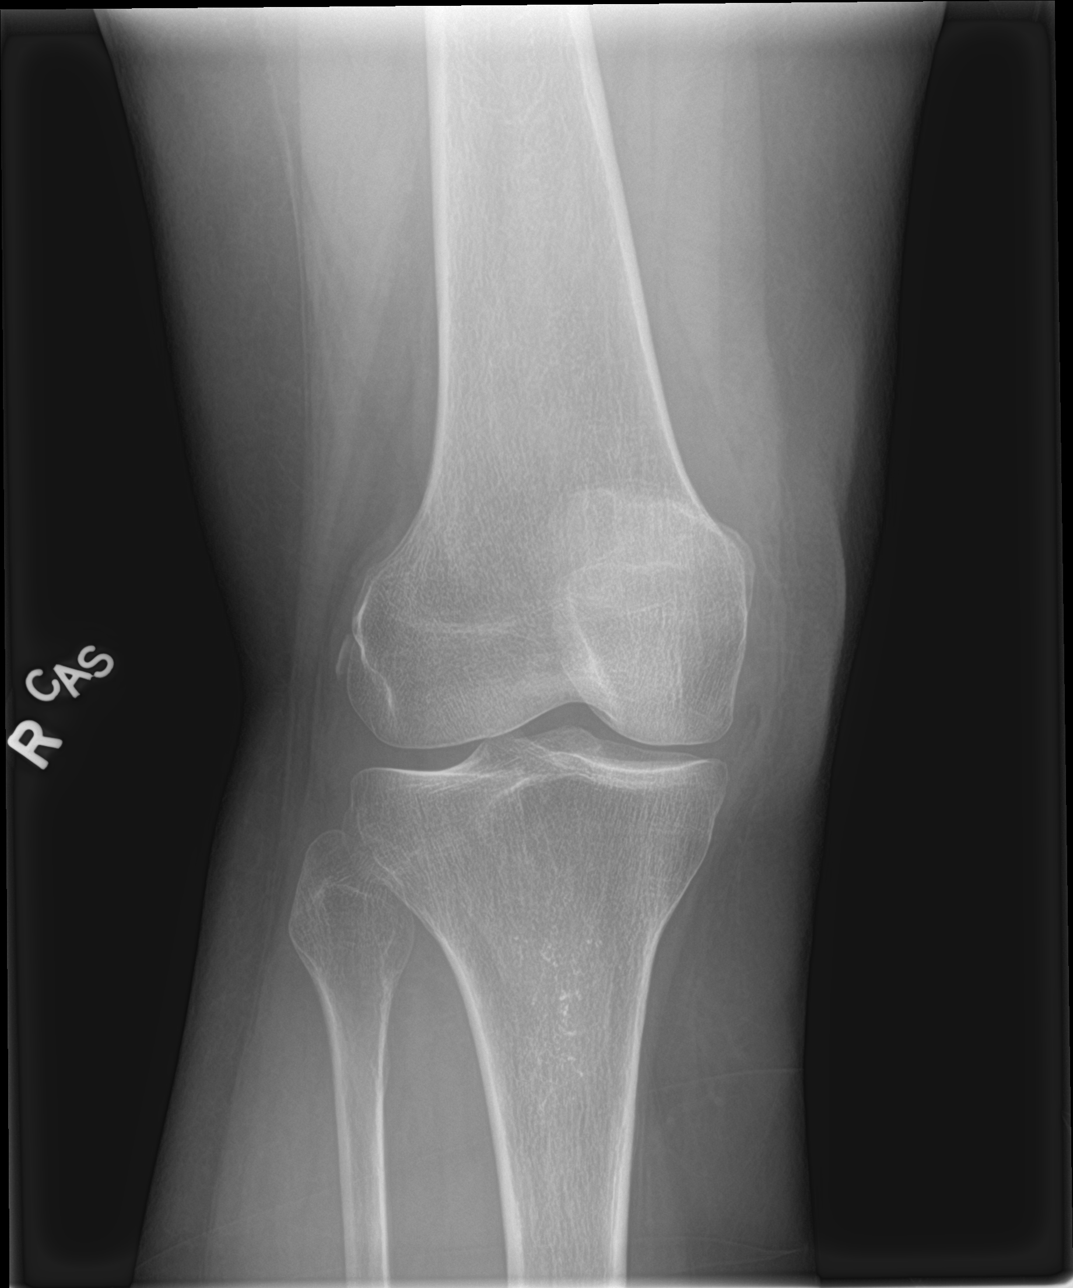

[4 of 4 positions shown; findings below may reference images not displayed]

FINDINGS: No evidence of fracture, dislocation, or joint effusion. No evidence
of arthropathy or other focal bone abnormality. Soft tissues are
unremarkable.
IMPRESSION: Negative.

## 2019-04-02 ENCOUNTER — Ambulatory Visit: Payer: Medicare HMO | Admitting: Orthopedic Surgery

## 2019-04-09 ENCOUNTER — Other Ambulatory Visit: Payer: Self-pay | Admitting: Physician Assistant

## 2019-04-09 ENCOUNTER — Ambulatory Visit
Admission: RE | Admit: 2019-04-09 | Discharge: 2019-04-09 | Disposition: A | Discharge status: Home / Self Care | Payer: Medicare HMO | Source: Ambulatory Visit | Attending: Physician Assistant | Admitting: Physician Assistant

## 2019-04-09 DIAGNOSIS — R1010 Upper abdominal pain, unspecified: Secondary | ICD-10-CM

## 2019-04-09 DIAGNOSIS — R1013 Epigastric pain: Secondary | ICD-10-CM | POA: Diagnosis not present

## 2019-04-09 DIAGNOSIS — K5900 Constipation, unspecified: Secondary | ICD-10-CM

## 2019-04-09 DIAGNOSIS — R197 Diarrhea, unspecified: Secondary | ICD-10-CM | POA: Diagnosis not present

## 2019-04-11 ENCOUNTER — Ambulatory Visit: Payer: Medicare HMO | Admitting: Orthopedic Surgery

## 2019-04-13 DIAGNOSIS — M5416 Radiculopathy, lumbar region: Secondary | ICD-10-CM | POA: Diagnosis not present

## 2019-04-13 DIAGNOSIS — F1120 Opioid dependence, uncomplicated: Secondary | ICD-10-CM | POA: Diagnosis not present

## 2019-04-13 DIAGNOSIS — G542 Cervical root disorders, not elsewhere classified: Secondary | ICD-10-CM | POA: Diagnosis not present

## 2019-05-03 DIAGNOSIS — M7122 Synovial cyst of popliteal space [Baker], left knee: Secondary | ICD-10-CM | POA: Diagnosis not present

## 2019-05-03 DIAGNOSIS — M1991 Primary osteoarthritis, unspecified site: Secondary | ICD-10-CM | POA: Diagnosis not present

## 2019-05-03 DIAGNOSIS — G894 Chronic pain syndrome: Secondary | ICD-10-CM | POA: Diagnosis not present

## 2019-05-03 DIAGNOSIS — K219 Gastro-esophageal reflux disease without esophagitis: Secondary | ICD-10-CM | POA: Diagnosis not present

## 2019-05-03 DIAGNOSIS — E663 Overweight: Secondary | ICD-10-CM | POA: Diagnosis not present

## 2019-05-03 DIAGNOSIS — I10 Essential (primary) hypertension: Secondary | ICD-10-CM | POA: Diagnosis not present

## 2019-05-03 DIAGNOSIS — E063 Autoimmune thyroiditis: Secondary | ICD-10-CM | POA: Diagnosis not present

## 2019-05-03 DIAGNOSIS — Z6828 Body mass index (BMI) 28.0-28.9, adult: Secondary | ICD-10-CM | POA: Diagnosis not present

## 2019-05-03 DIAGNOSIS — M5417 Radiculopathy, lumbosacral region: Secondary | ICD-10-CM | POA: Diagnosis not present

## 2019-05-04 DIAGNOSIS — K5900 Constipation, unspecified: Secondary | ICD-10-CM | POA: Diagnosis not present

## 2019-05-04 DIAGNOSIS — R1010 Upper abdominal pain, unspecified: Secondary | ICD-10-CM | POA: Diagnosis not present

## 2019-05-04 DIAGNOSIS — K2960 Other gastritis without bleeding: Secondary | ICD-10-CM | POA: Diagnosis not present

## 2019-05-04 DIAGNOSIS — Z800 Family history of malignant neoplasm of digestive organs: Secondary | ICD-10-CM | POA: Diagnosis not present

## 2019-05-04 DIAGNOSIS — R198 Other specified symptoms and signs involving the digestive system and abdomen: Secondary | ICD-10-CM | POA: Diagnosis not present

## 2019-05-04 DIAGNOSIS — Z86010 Personal history of colonic polyps: Secondary | ICD-10-CM | POA: Diagnosis not present

## 2019-05-07 DIAGNOSIS — E782 Mixed hyperlipidemia: Secondary | ICD-10-CM | POA: Diagnosis not present

## 2019-05-07 DIAGNOSIS — I10 Essential (primary) hypertension: Secondary | ICD-10-CM | POA: Diagnosis not present

## 2019-05-07 DIAGNOSIS — E063 Autoimmune thyroiditis: Secondary | ICD-10-CM | POA: Diagnosis not present

## 2019-05-25 DIAGNOSIS — Z1159 Encounter for screening for other viral diseases: Secondary | ICD-10-CM | POA: Diagnosis not present

## 2019-05-25 DIAGNOSIS — R1010 Upper abdominal pain, unspecified: Secondary | ICD-10-CM | POA: Diagnosis not present

## 2019-05-28 DIAGNOSIS — G894 Chronic pain syndrome: Secondary | ICD-10-CM | POA: Diagnosis not present

## 2019-05-28 DIAGNOSIS — R42 Dizziness and giddiness: Secondary | ICD-10-CM | POA: Diagnosis not present

## 2019-05-28 DIAGNOSIS — Z681 Body mass index (BMI) 19 or less, adult: Secondary | ICD-10-CM | POA: Diagnosis not present

## 2019-05-30 DIAGNOSIS — Z1211 Encounter for screening for malignant neoplasm of colon: Secondary | ICD-10-CM | POA: Diagnosis not present

## 2019-05-30 DIAGNOSIS — D122 Benign neoplasm of ascending colon: Secondary | ICD-10-CM | POA: Diagnosis not present

## 2019-05-30 DIAGNOSIS — K2930 Chronic superficial gastritis without bleeding: Secondary | ICD-10-CM | POA: Diagnosis not present

## 2019-05-30 DIAGNOSIS — K621 Rectal polyp: Secondary | ICD-10-CM | POA: Diagnosis not present

## 2019-05-30 DIAGNOSIS — R110 Nausea: Secondary | ICD-10-CM | POA: Diagnosis not present

## 2019-05-30 DIAGNOSIS — Z800 Family history of malignant neoplasm of digestive organs: Secondary | ICD-10-CM | POA: Diagnosis not present

## 2019-06-01 DIAGNOSIS — K621 Rectal polyp: Secondary | ICD-10-CM | POA: Diagnosis not present

## 2019-06-01 DIAGNOSIS — D122 Benign neoplasm of ascending colon: Secondary | ICD-10-CM | POA: Diagnosis not present

## 2019-06-01 DIAGNOSIS — K2930 Chronic superficial gastritis without bleeding: Secondary | ICD-10-CM | POA: Diagnosis not present

## 2019-06-28 DIAGNOSIS — G894 Chronic pain syndrome: Secondary | ICD-10-CM | POA: Diagnosis not present

## 2019-06-28 DIAGNOSIS — I10 Essential (primary) hypertension: Secondary | ICD-10-CM | POA: Diagnosis not present

## 2019-06-30 IMAGING — CT CT ABD-PELV W/O
2 of 4 series · 16 of 46 positions shown, 18 images · non-contrast
Comparison: 10/05/2018

CLINICAL DATA: Difficulty voiding since yesterday. Bilateral flank
pain.

EXAM:
CT ABDOMEN AND PELVIS WITHOUT CONTRAST
TECHNIQUE: Multidetector CT imaging of the abdomen and pelvis was performed
following the standard protocol without IV contrast.

[Series 3: axial st · axial · 0.80mm/px · z∈[+1073,+1548]mm · 13 of 103 slices shown, 15 images]
[im 4/103  soft-tissue]
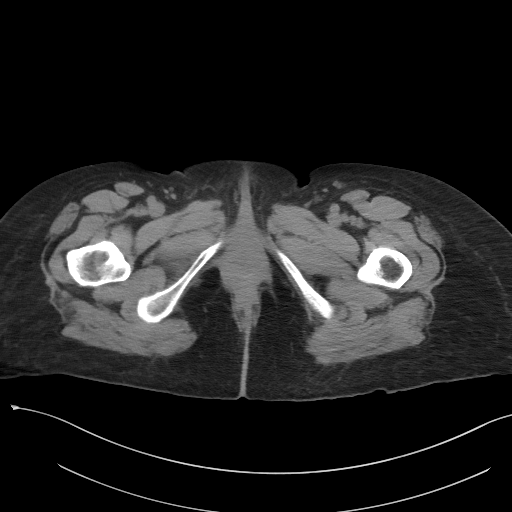
[im 4/103  bone]
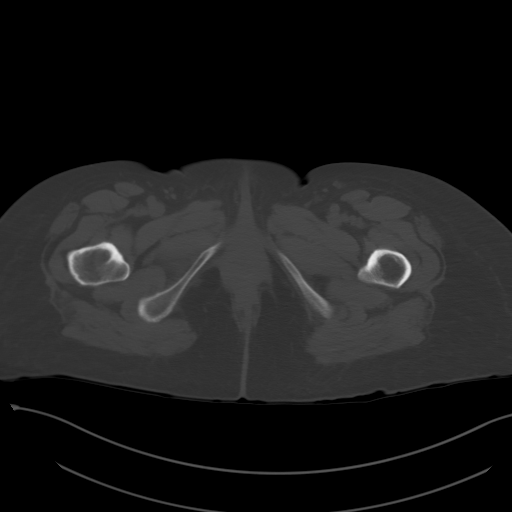
[im 12/103  soft-tissue]
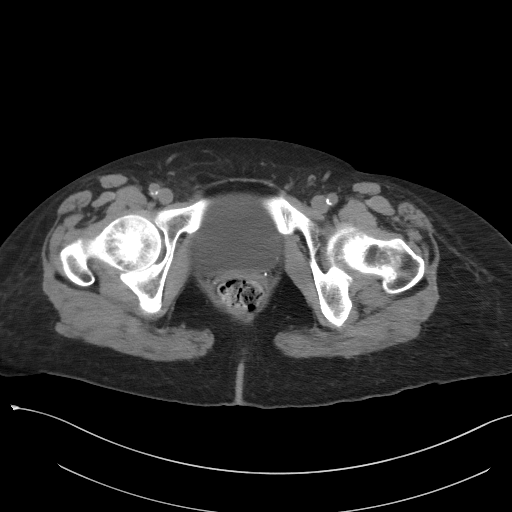
[im 20/103  soft-tissue]
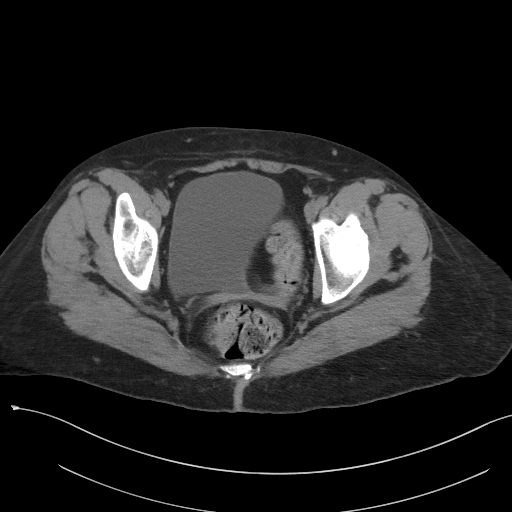
[im 28/103  soft-tissue]
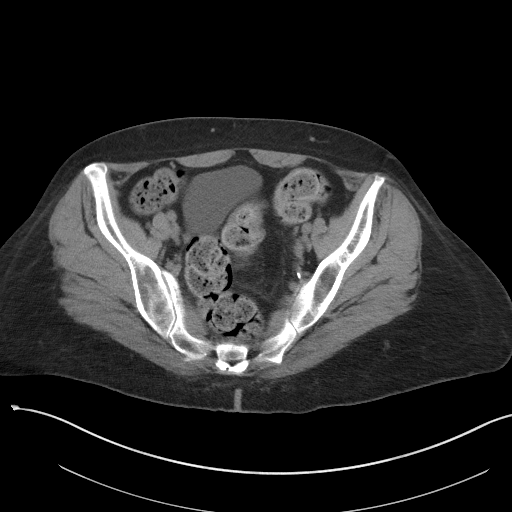
[im 36/103  soft-tissue]
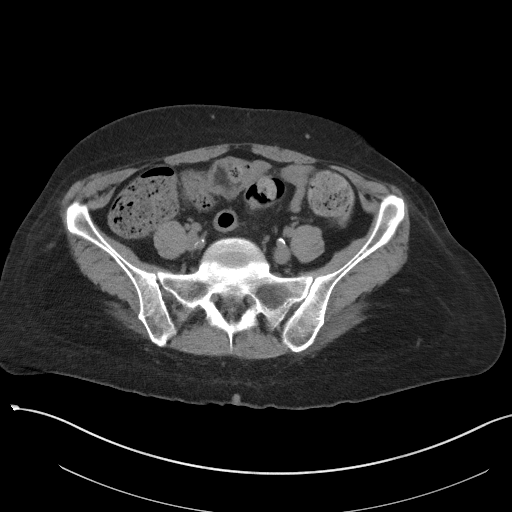
[im 44/103  soft-tissue]
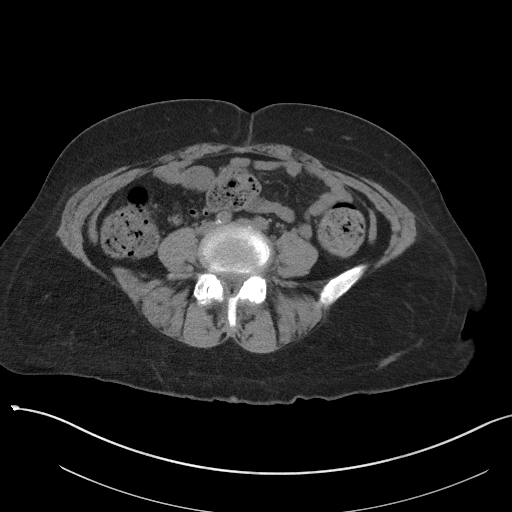
[im 52/103  soft-tissue]
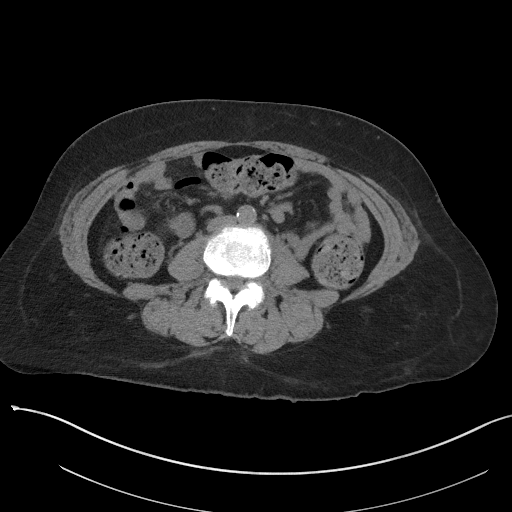
[im 59/103  soft-tissue]
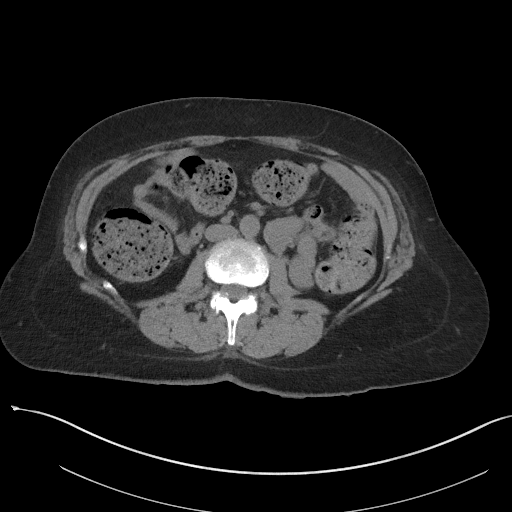
[im 67/103  soft-tissue]
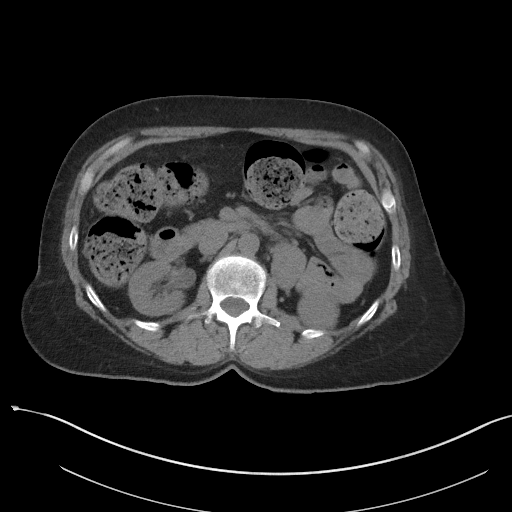
[im 67/103  bone]
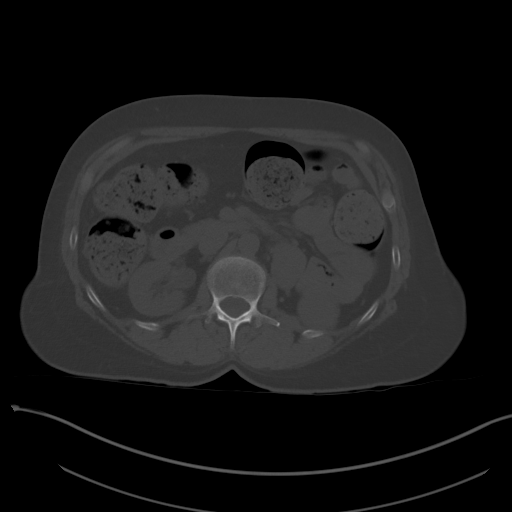
[im 75/103  soft-tissue]
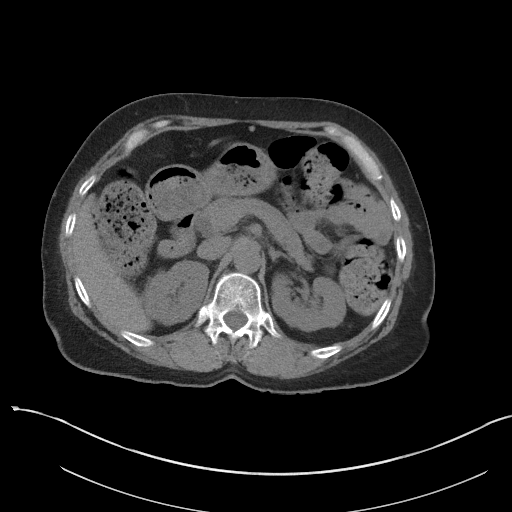
[im 83/103  soft-tissue]
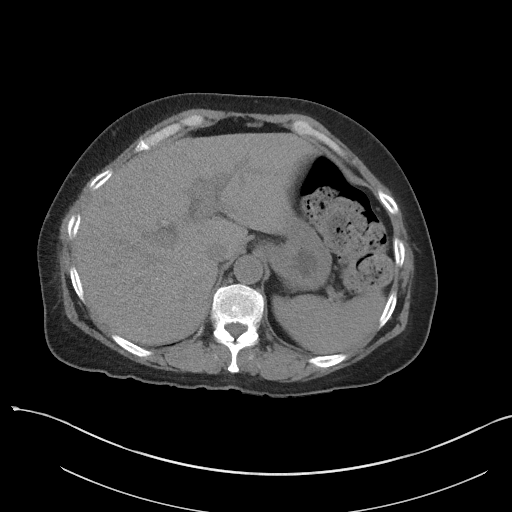
[im 91/103  soft-tissue]
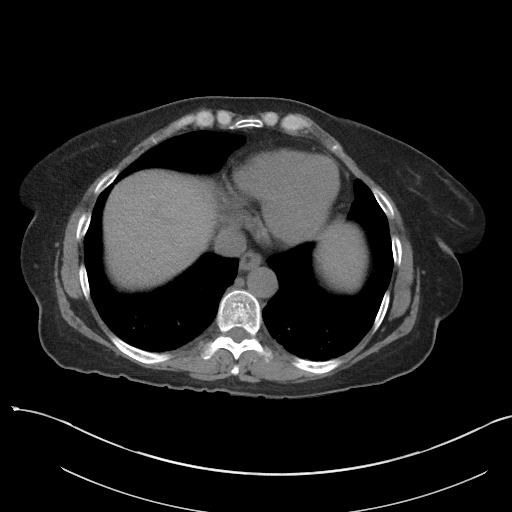
[im 99/103  soft-tissue]
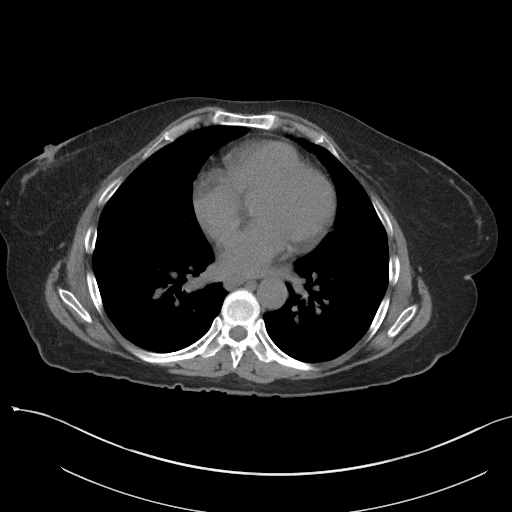

[Series 6: coronal st · coronal · 0.81mm/px · 3 of 97 slices shown]
[im 33/97  soft-tissue]
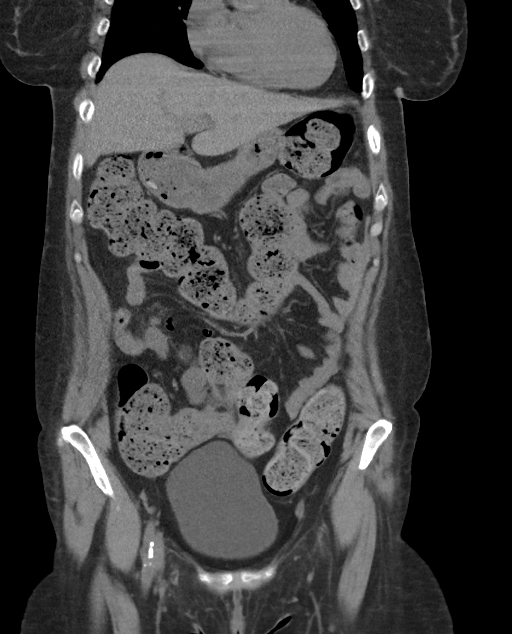
[im 43/97  soft-tissue]
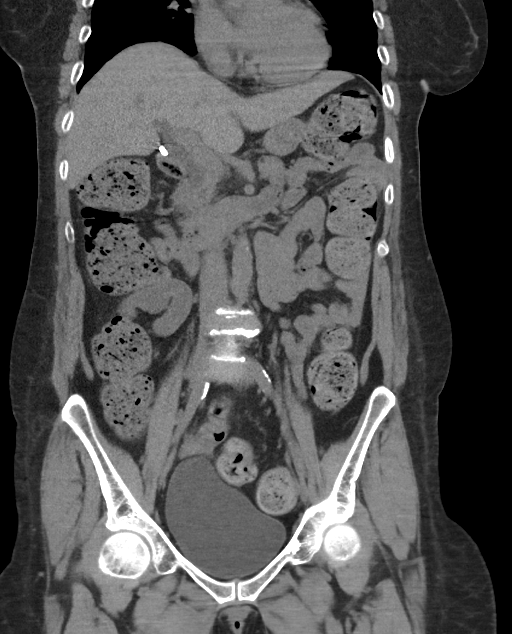
[im 54/97  soft-tissue]
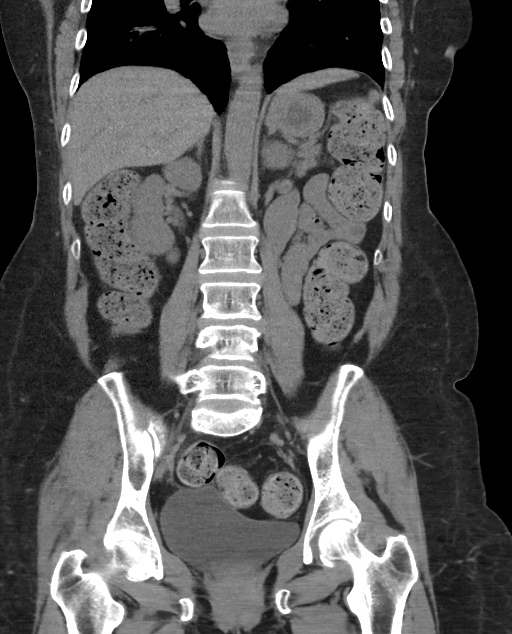

[16 of 46 positions shown; findings below may reference images not displayed]

FINDINGS: Lower chest: Dependent bibasilar atelectasis. Probable mild pleural
thickening at each lung base, series [DATE]

Hepatobiliary: No focal liver abnormality is seen. Status post
cholecystectomy. Stable dilatation of the common bowel duct as it
courses through the pancreatic head doubt choledocholithiasis. This
measures up to 7 mm.

Pancreas: No acute inflammation or mass. No pancreatic ductal
dilatation.

Spleen: Normal in size without focal abnormality.

Adrenals/Urinary Tract: Adrenal glands are unremarkable. Kidneys are
normal, without renal calculi, focal lesion, or hydronephrosis.
Bladder is unremarkable.

Stomach/Bowel: Significant stool retention throughout the colon
consistent constipation. No small bowel dilatation or obstruction.
No inflammation. The stomach is decompressed in appearance.

Vascular/Lymphatic: Aortic atherosclerosis. No enlarged abdominal or
pelvic lymph nodes.

Reproductive: Status post hysterectomy. No adnexal masses.

Other: No abdominal wall hernia or abnormality. No abdominopelvic
ascites.

Musculoskeletal: Lumbar spondylosis with facet arthrosis. No acute
fracture.
IMPRESSION: 1. Significant stool retention throughout the colon consistent with
constipation. No bowel obstruction or inflammation.
2. No nephrolithiasis or obstructive uropathy.

Aortic Atherosclerosis ([id number]-TZJ.J).

## 2019-07-03 DIAGNOSIS — M62838 Other muscle spasm: Secondary | ICD-10-CM | POA: Diagnosis not present

## 2019-07-03 DIAGNOSIS — M8589 Other specified disorders of bone density and structure, multiple sites: Secondary | ICD-10-CM | POA: Diagnosis not present

## 2019-07-03 DIAGNOSIS — Z7952 Long term (current) use of systemic steroids: Secondary | ICD-10-CM | POA: Diagnosis not present

## 2019-07-03 DIAGNOSIS — Z1321 Encounter for screening for nutritional disorder: Secondary | ICD-10-CM | POA: Diagnosis not present

## 2019-07-03 DIAGNOSIS — E039 Hypothyroidism, unspecified: Secondary | ICD-10-CM | POA: Diagnosis not present

## 2019-07-03 DIAGNOSIS — E273 Drug-induced adrenocortical insufficiency: Secondary | ICD-10-CM | POA: Diagnosis not present

## 2019-07-03 DIAGNOSIS — R739 Hyperglycemia, unspecified: Secondary | ICD-10-CM | POA: Diagnosis not present

## 2019-07-03 DIAGNOSIS — R630 Anorexia: Secondary | ICD-10-CM | POA: Diagnosis not present

## 2019-07-03 DIAGNOSIS — Z23 Encounter for immunization: Secondary | ICD-10-CM | POA: Diagnosis not present

## 2019-07-03 DIAGNOSIS — I10 Essential (primary) hypertension: Secondary | ICD-10-CM | POA: Diagnosis not present

## 2019-07-04 DIAGNOSIS — K2960 Other gastritis without bleeding: Secondary | ICD-10-CM | POA: Diagnosis not present

## 2019-07-04 DIAGNOSIS — R1010 Upper abdominal pain, unspecified: Secondary | ICD-10-CM | POA: Diagnosis not present

## 2019-07-04 DIAGNOSIS — Z86010 Personal history of colonic polyps: Secondary | ICD-10-CM | POA: Diagnosis not present

## 2019-07-04 DIAGNOSIS — R198 Other specified symptoms and signs involving the digestive system and abdomen: Secondary | ICD-10-CM | POA: Diagnosis not present

## 2019-07-04 DIAGNOSIS — Z800 Family history of malignant neoplasm of digestive organs: Secondary | ICD-10-CM | POA: Diagnosis not present

## 2019-07-04 DIAGNOSIS — R110 Nausea: Secondary | ICD-10-CM | POA: Diagnosis not present

## 2019-07-06 ENCOUNTER — Telehealth: Payer: Self-pay | Admitting: Genetic Counselor

## 2019-07-06 NOTE — Telephone Encounter (Signed)
Confirmed with patient 11/9 Webex genetics visit with [name].

## 2019-07-07 DIAGNOSIS — E7849 Other hyperlipidemia: Secondary | ICD-10-CM | POA: Diagnosis not present

## 2019-07-07 DIAGNOSIS — I10 Essential (primary) hypertension: Secondary | ICD-10-CM | POA: Diagnosis not present

## 2019-07-08 IMAGING — US US EXTREM LOW VENOUS*L*
1 series · 13 of 24 positions shown · non-contrast
Comparison: None.

CLINICAL DATA: Left lower extremity pain and edema since last
[REDACTED]. Evaluate for DVT.



[Series 1: us extrem low venous*left* · 13 of 42 slices shown]
[im 1/42]
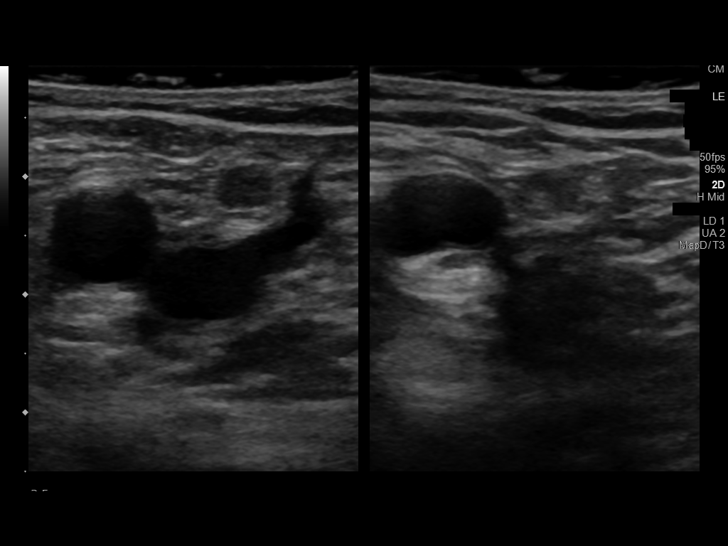
[im 4/42]
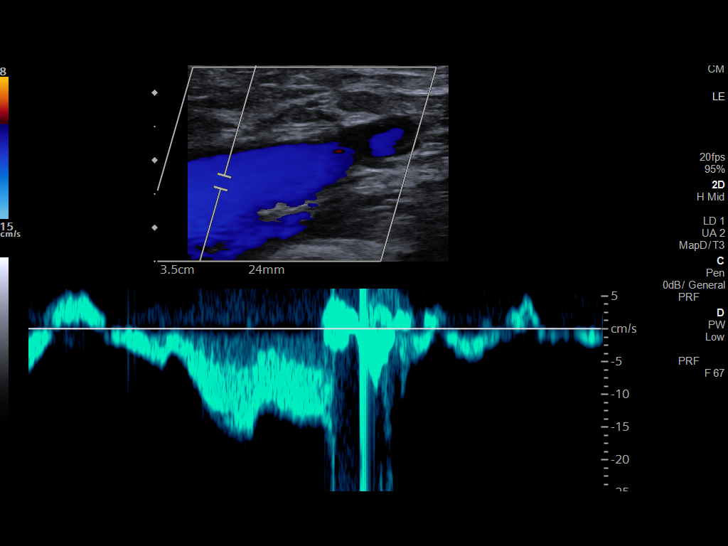
[im 8/42]
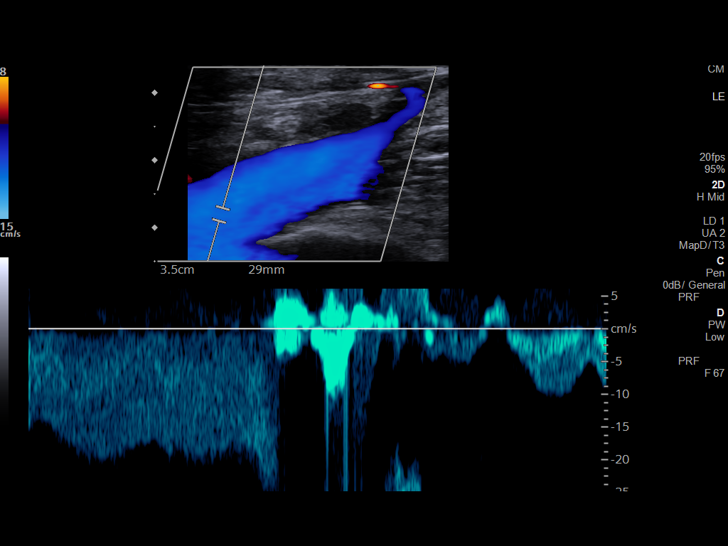
[im 11/42]
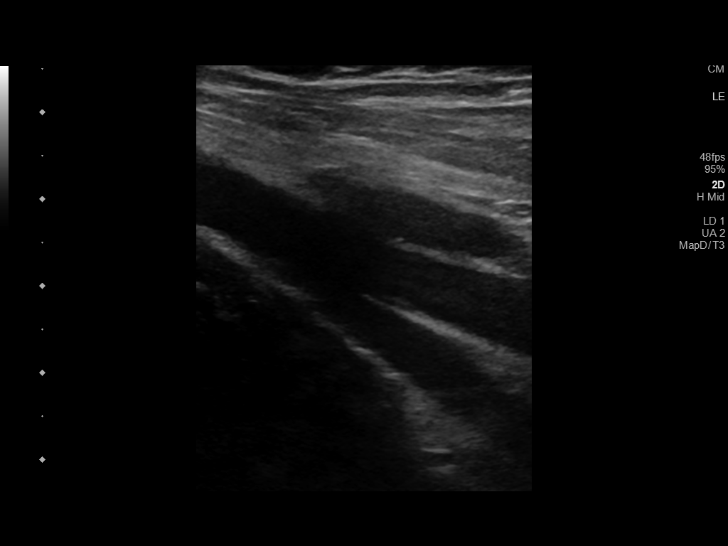
[im 15/42]
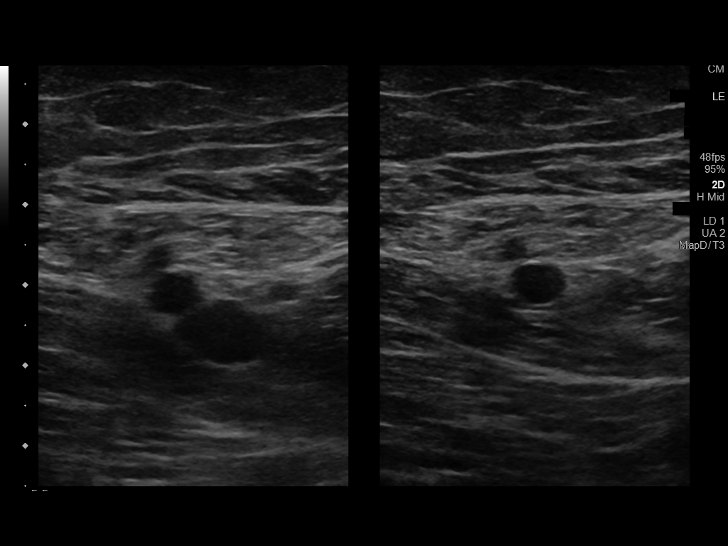
[im 18/42]
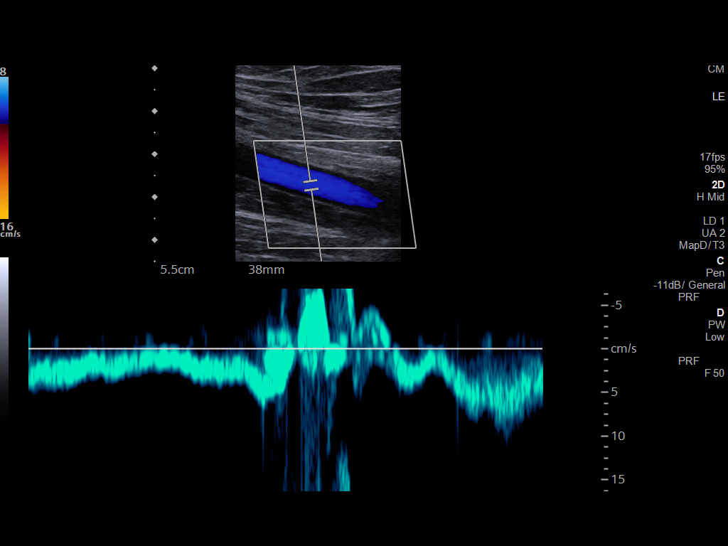
[im 22/42]
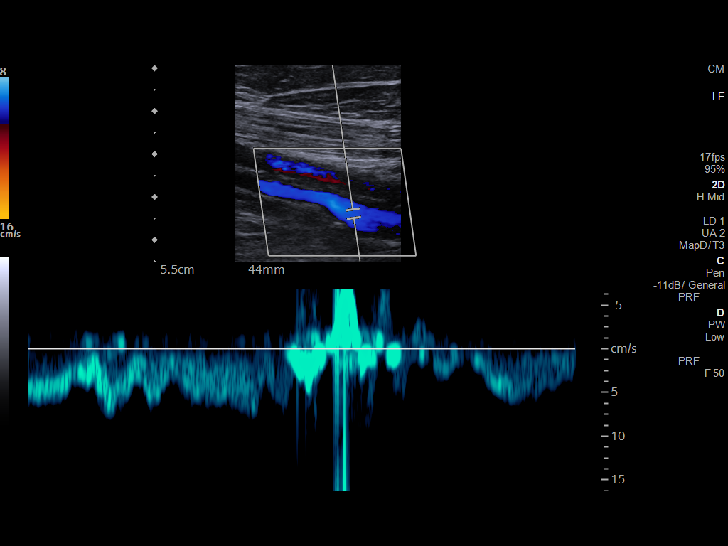
[im 24/42]
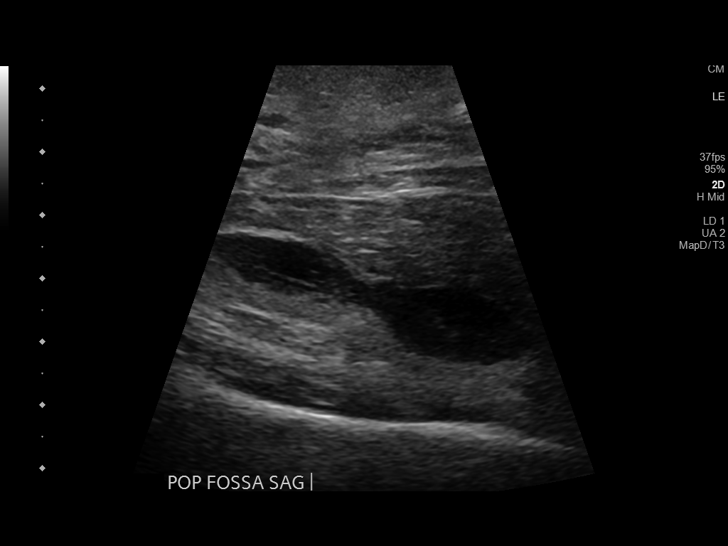
[im 27/42]
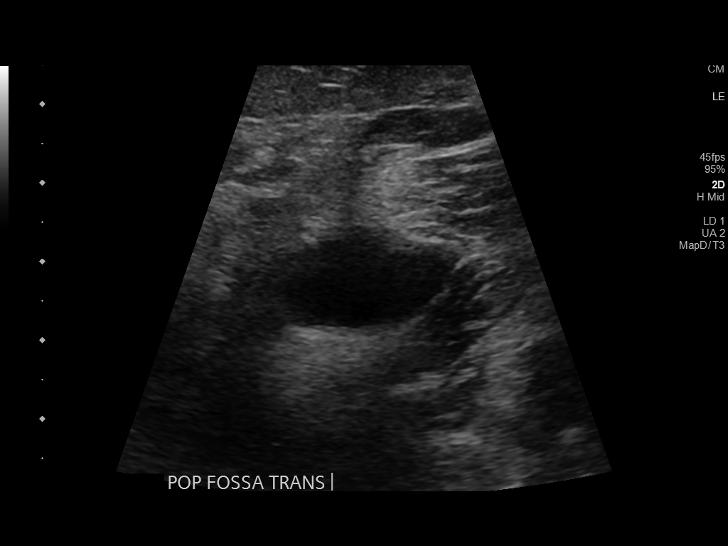
[im 31/42]
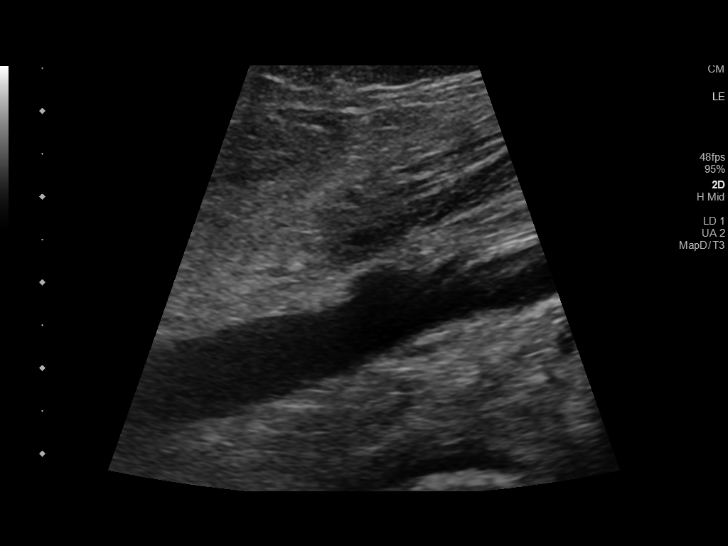
[im 34/42]
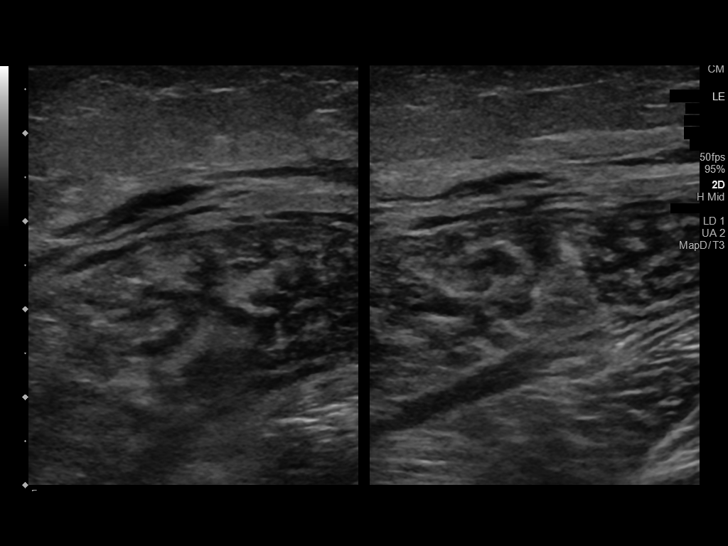
[im 38/42]
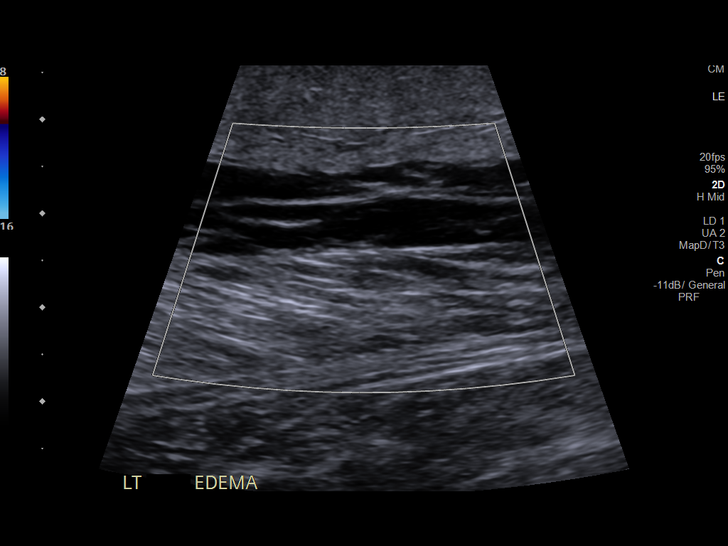
[im 42/42]
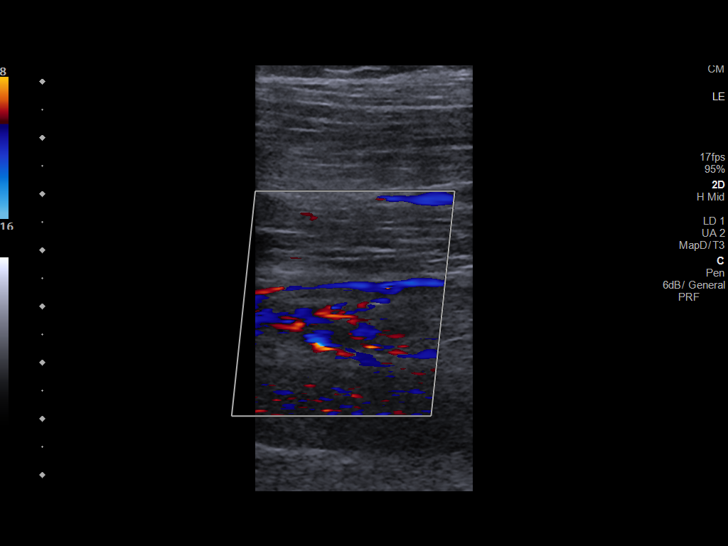

[13 of 24 positions shown; findings below may reference images not displayed]

FINDINGS: Contralateral Common Femoral Vein: Respiratory phasicity is normal
and symmetric with the symptomatic side. No evidence of thrombus.
Normal compressibility.

Common Femoral Vein: No evidence of thrombus. Normal
compressibility, respiratory phasicity and response to augmentation.

Saphenofemoral Junction: No evidence of thrombus. Normal
compressibility and flow on color Doppler imaging.

Profunda Femoral Vein: No evidence of thrombus. Normal
compressibility and flow on color Doppler imaging.

Femoral Vein: No evidence of thrombus. Normal compressibility,
respiratory phasicity and response to augmentation.

Popliteal Vein: No evidence of thrombus. Normal compressibility,
respiratory phasicity and response to augmentation.

Calf Veins: No evidence of thrombus. Normal compressibility and flow
on color Doppler imaging.

Superficial Great Saphenous Vein: No evidence of thrombus. Normal
compressibility.

Venous Reflux:  None.

Other Findings: There is an approximately 5.3 x 1.2 x 2.1 cm
serpiginous fluid collection with the left popliteal fossa
compatible with a Baker's cyst.
IMPRESSION: 1. No evidence of DVT within the left lower extremity.
2. Incidentally noted approximately 5.3 cm left-sided Baker's cyst.

## 2019-07-13 ENCOUNTER — Telehealth: Payer: Self-pay | Admitting: Genetic Counselor

## 2019-07-13 NOTE — Telephone Encounter (Signed)
Called patient regarding upcoming Webex appointment, left a voicemail. This will be considered a walk-in visit but I did send a Webex invite just in case.

## 2019-07-13 NOTE — Telephone Encounter (Signed)
Patient returned phone call regarding voicemail that was left, screener complete and e-mail has been sent.

## 2019-07-16 ENCOUNTER — Inpatient Hospital Stay: Payer: Medicare HMO | Admitting: Genetic Counselor

## 2019-07-20 ENCOUNTER — Telehealth: Payer: Self-pay | Admitting: Genetic Counselor

## 2019-07-20 NOTE — Telephone Encounter (Signed)
Called patient regarding upcoming Webex appointment, patient is notified and e-mail has been sent. °

## 2019-07-23 ENCOUNTER — Encounter: Payer: Self-pay | Admitting: Genetic Counselor

## 2019-07-23 ENCOUNTER — Inpatient Hospital Stay: Payer: Medicare HMO | Attending: Genetic Counselor | Admitting: Genetic Counselor

## 2019-07-23 DIAGNOSIS — Z86010 Personal history of colonic polyps: Secondary | ICD-10-CM | POA: Diagnosis not present

## 2019-07-23 DIAGNOSIS — Z803 Family history of malignant neoplasm of breast: Secondary | ICD-10-CM

## 2019-07-23 DIAGNOSIS — Z800 Family history of malignant neoplasm of digestive organs: Secondary | ICD-10-CM | POA: Diagnosis not present

## 2019-07-23 NOTE — Progress Notes (Signed)
REFERRING PROVIDER: Redmond School, MD [address] [place],  [place] [address]  PRIMARY PROVIDER:  Redmond School, MD  PRIMARY REASON FOR VISIT:  1. Family history of breast cancer   2. Family history of colon cancer   3. History of colonic polyps      HISTORY OF PRESENT ILLNESS:   I connected with  Ms. Sedlar on 07/23/2019 at 2 PM EDT by Webex video conference and verified that I am speaking with the correct person using two identifiers.   Patient location: Dr. appointment Provider location: [facility]  Ms. Fitting, a 53 y.o. female, was seen for a [facility] cancer genetics consultation at the request of Dr. Gerarda Fraction due to a family history of cancer.  Ms. Wilz presents to clinic today to discuss the possibility of a hereditary predisposition to cancer, genetic testing, and to further clarify her future cancer risks, as well as potential cancer risks for family members.    Ms. Andrzejewski is a 53 y.o. female with no personal history of cancer.    CANCER HISTORY:  Oncology History   No history exists.     RISK FACTORS:  Menarche was at age 6.  First live birth at age 63.  Ovaries intact: yes.  Hysterectomy: yes.  Menopausal status: postmenopausal.  HRT use: 0 years. Colonoscopy: yes; 3-5 polyps. Mammogram within the last year: yes. Number of breast biopsies: 0. Up to date with pelvic exams: yes. Any excessive radiation exposure in the past: no  Past Medical History:  Diagnosis Date  . Addison's disease ([place])   . Adrenal insufficiency ([place])    diagnosed 2012  . Aneurysm ([place])   . Anxiety   . Arthritis   . Astigmatism   . CAD (coronary artery disease)    Cath 2008 EF normal. RCA 50-60, Septal 50%. Myoview 3/12: EF 53% normal perfusion  . Cardiac arrest ([place])    2/2 adissonian crisis  . Cardiomyopathy    resolved  . Chest pain    chronicc  . CHF (congestive heart failure) ([place])   . Chronic back pain   . Chronic diarrhea   . Concussion    sept 28th 2014   . Family history of breast cancer   . Family history of colon cancer   . Gastroparesis   . GERD (gastroesophageal reflux disease)   . HTN (hypertension)   . Hyperlipidemia   . Hypothyroidism   . Mitral valve prolapse   . Nondiabetic gastroparesis   . PONV (postoperative nausea and vomiting)   . QT prolongation   . Tobacco abuse    down to 2 cigarettes per day  . Vertigo     Past Surgical History:  Procedure Laterality Date  . ABDOMINAL HYSTERECTOMY    . CARDIAC CATHETERIZATION  03/2007   showed 60% lesion in the right coronary artery  . CHOLECYSTECTOMY    . LEFT HEART CATHETERIZATION WITH CORONARY ANGIOGRAM N/A 04/13/2012   Procedure: LEFT HEART CATHETERIZATION WITH CORONARY ANGIOGRAM;  Surgeon: Hillary Bow, MD;  Location: [facility] CATH LAB;  Service: Cardiovascular;  Laterality: N/A;  . LUMBAR LAMINECTOMY/DECOMPRESSION MICRODISCECTOMY Left 12/20/2018   Procedure: Left Lumbar Three-Four Lumbar Laminotomy and Foraminotomy, Lumbar Four-Five Laminotomy and Foraminotomy with Microdiscectomy and Resection of Synovial Cyst;  Surgeon: Jovita Gamma, MD;  Location: [facility];  Service: Neurosurgery;  Laterality: Left;  Left Lumbar 3-4 Lumbar laminotomy, foraminotomy, possible microdiscectomy with possible resection of synovial cyst  . SPINE SURGERY    . VARICOSE VEIN SURGERY    .  VESICOVAGINAL FISTULA CLOSURE W/ TAH      Social History   Socioeconomic History  . Marital status: Single    Spouse name: Not on file  . Number of children: Not on file  . Years of education: Not on file  . Highest education level: Not on file  Occupational History  . Not on file  Social Needs  . Financial resource strain: Not on file  . Food insecurity    Worry: Not on file    Inability: Not on file  . Transportation needs    Medical: Not on file    Non-medical: Not on file  Tobacco Use  . Smoking status: Current Every Day Smoker    Packs/day: 0.25    Years: 10.00    Pack years: 2.50    Types:  Cigarettes  . Smokeless tobacco: Never Used  . Tobacco comment: smokes 2 a day  Substance and Sexual Activity  . Alcohol use: No  . Drug use: No    Comment: Hisrory of Cocaine use   . Sexual activity: Never    Birth control/protection: Surgical  Lifestyle  . Physical activity    Days per week: Not on file    Minutes per session: Not on file  . Stress: Not on file  Relationships  . Social Herbalist on phone: Not on file    Gets together: Not on file    Attends religious service: Not on file    Active member of club or organization: Not on file    Attends meetings of clubs or organizations: Not on file    Relationship status: Not on file  Other Topics Concern  . Not on file  Social History Narrative   Lives in [place] with daughter and son. She is on disability.      FAMILY HISTORY:  We obtained a detailed, 4-generation family history.  Significant diagnoses are listed below: Family History  Problem Relation Age of Onset  . Cancer Father        Colon  . Coronary artery disease Other   . Heart attack Mother        d. 16  . Breast cancer Paternal Uncle 74  . Dementia Maternal Grandmother   . Leukemia Maternal Uncle 1    The patient has a son and daughter who are cancer free.  She has three sisters and two brothers.  One sister is being worked up for pancreatic cancer.  Both parents are deceased.  The patient's father died of colon cancer at 85.  He had 4 brothers and 2 sisters.  One sister had breast cancer at 67.  The paternal grandparents are deceased from unknown causes.  The patient's mother had cervical cancer.  She had two brothers and a sister.  One brother had leukemia at 89 years old.  The maternal grandparents are deceased from unknown reasons.  Ms. Tayag is unaware of previous family history of genetic testing for hereditary cancer risks. Patient's maternal ancestors are of [place] descent, and paternal ancestors are of Caucasian descent.  There is no reported Ashkenazi Jewish ancestry. There is no known consanguinity.    GENETIC COUNSELING ASSESSMENT: Ms. Weingartner is a 53 y.o. female with a family history of cancer which is somewhat suggestive of a hereditary cancer syndrome and predisposition to cancer given the combination of cancer and the young ages of onset. We, therefore, discussed and recommended the following at today's visit.   DISCUSSION: We discussed that 5 -  7% of colon cancer is hereditary, with most cases associated with Lynch syndrome.  We discussed that there are several cancers associated with Lynch syndrome, including other GI cancers, GYN cancer and sometimes breast cancer  There are other genes that can be associated with hereditary colon cancer syndromes.  These include MUTYH and APC, which can cause early colon cancer as seen in her father.  We discussed that testing is beneficial for several reasons including knowing how to follow individuals after completing their treatment, identifying whether potential treatment options such as PARP inhibitors would be beneficial, and understand if other family members could be at risk for cancer and allow them to undergo genetic testing.   We reviewed the characteristics, features and inheritance patterns of hereditary cancer syndromes. We also discussed genetic testing, including the appropriate family members to test, the process of testing, insurance coverage and turn-around-time for results. We discussed the implications of a negative, positive, carrier and/or variant of uncertain significant result. We recommended Ms. Wartman pursue genetic testing for the common hereditary gene panel. The Common Hereditary Gene Panel offered by [facility] includes sequencing and/or deletion duplication testing of the following 48 genes: APC, ATM, AXIN2, BARD1, BMPR1A, BRCA1, BRCA2, BRIP1, CDH1, CDK4, CDKN2A (p14ARF), CDKN2A (p16INK4a), CHEK2, CTNNA1, DICER1, EPCAM (Deletion/duplication testing  only), GREM1 (promoter region deletion/duplication testing only), KIT, MEN1, MLH1, MSH2, MSH3, MSH6, MUTYH, NBN, NF1, NHTL1, PALB2, PDGFRA, PMS2, POLD1, POLE, PTEN, RAD50, RAD51C, RAD51D, RNF43, SDHB, SDHC, SDHD, SMAD4, SMARCA4. STK11, TP53, TSC1, TSC2, and VHL.  The following genes were evaluated for sequence changes only: SDHA and HOXB13 c.251G>A variant only.   Based on Ms. Aboud's family history of cancer, she meets medical criteria for genetic testing. Despite that she meets criteria, she may still have an out of pocket cost. We discussed that if her out of pocket cost for testing is over $100, the laboratory will call and confirm whether she wants to proceed with testing.  If the out of pocket cost of testing is less than $100 she will be billed by the genetic testing laboratory.   PLAN: After considering the risks, benefits, and limitations, Ms. Linan provided informed consent to pursue genetic testing and the blood sample was sent to [facility] for analysis of the common hereditary cancer panel. Results should be available within approximately 2-3 weeks' time, at which point they will be disclosed by telephone to Ms. Rosemeyer, as will any additional recommendations warranted by these results. Ms. Eugenio will receive a summary of her genetic counseling visit and a copy of her results once available. This information will also be available in Epic.   Ms. Matusik questions were answered to her satisfaction today. Our contact information was provided should additional questions or concerns arise. Thank you for the referral and allowing [place] to share in the care of your patient.   Tashayla Therien P. Florene Glen, [place], [facility] Licensed, Insurance risk surveyor [email or web address] phone: [phone]  The patient was seen for a total of 45 minutes in face-to-face genetic counseling.  This patient was discussed with Drs. Magrinat, Lindi Adie and/or Burr Medico who agrees with the above.     _______________________________________________________________________ For Office Staff:  Number of people involved in session: 1 Was an Intern/ student involved with case: no

## 2019-07-27 DIAGNOSIS — J069 Acute upper respiratory infection, unspecified: Secondary | ICD-10-CM | POA: Diagnosis not present

## 2019-08-05 ENCOUNTER — Inpatient Hospital Stay (HOSPITAL_COMMUNITY)
Admission: EM | Admit: 2019-08-05 | Discharge: 2019-08-07 | DRG: 866 | Disposition: A | Discharge status: Home / Self Care | Payer: Medicare HMO | Attending: Family Medicine | Admitting: Family Medicine

## 2019-08-05 ENCOUNTER — Emergency Department (HOSPITAL_COMMUNITY): Payer: Medicare HMO

## 2019-08-05 ENCOUNTER — Other Ambulatory Visit: Payer: Self-pay

## 2019-08-05 ENCOUNTER — Encounter (HOSPITAL_COMMUNITY): Payer: Self-pay

## 2019-08-05 DIAGNOSIS — K219 Gastro-esophageal reflux disease without esophagitis: Secondary | ICD-10-CM | POA: Diagnosis present

## 2019-08-05 DIAGNOSIS — Z20828 Contact with and (suspected) exposure to other viral communicable diseases: Secondary | ICD-10-CM | POA: Diagnosis not present

## 2019-08-05 DIAGNOSIS — E785 Hyperlipidemia, unspecified: Secondary | ICD-10-CM | POA: Diagnosis present

## 2019-08-05 DIAGNOSIS — J9601 Acute respiratory failure with hypoxia: Secondary | ICD-10-CM | POA: Diagnosis not present

## 2019-08-05 DIAGNOSIS — R1110 Vomiting, unspecified: Secondary | ICD-10-CM | POA: Diagnosis not present

## 2019-08-05 DIAGNOSIS — N179 Acute kidney failure, unspecified: Secondary | ICD-10-CM | POA: Diagnosis present

## 2019-08-05 DIAGNOSIS — I5032 Chronic diastolic (congestive) heart failure: Secondary | ICD-10-CM | POA: Diagnosis not present

## 2019-08-05 DIAGNOSIS — K5903 Drug induced constipation: Secondary | ICD-10-CM | POA: Diagnosis present

## 2019-08-05 DIAGNOSIS — R1115 Cyclical vomiting syndrome unrelated to migraine: Secondary | ICD-10-CM | POA: Diagnosis not present

## 2019-08-05 DIAGNOSIS — G8929 Other chronic pain: Secondary | ICD-10-CM | POA: Diagnosis present

## 2019-08-05 DIAGNOSIS — Z90710 Acquired absence of both cervix and uterus: Secondary | ICD-10-CM

## 2019-08-05 DIAGNOSIS — F17210 Nicotine dependence, cigarettes, uncomplicated: Secondary | ICD-10-CM | POA: Diagnosis present

## 2019-08-05 DIAGNOSIS — Z79899 Other long term (current) drug therapy: Secondary | ICD-10-CM | POA: Diagnosis not present

## 2019-08-05 DIAGNOSIS — E7849 Other hyperlipidemia: Secondary | ICD-10-CM | POA: Diagnosis not present

## 2019-08-05 DIAGNOSIS — I2510 Atherosclerotic heart disease of native coronary artery without angina pectoris: Secondary | ICD-10-CM | POA: Diagnosis present

## 2019-08-05 DIAGNOSIS — I10 Essential (primary) hypertension: Secondary | ICD-10-CM | POA: Diagnosis present

## 2019-08-05 DIAGNOSIS — K3184 Gastroparesis: Secondary | ICD-10-CM | POA: Diagnosis present

## 2019-08-05 DIAGNOSIS — I429 Cardiomyopathy, unspecified: Secondary | ICD-10-CM | POA: Diagnosis not present

## 2019-08-05 DIAGNOSIS — B349 Viral infection, unspecified: Secondary | ICD-10-CM | POA: Diagnosis not present

## 2019-08-05 DIAGNOSIS — I110 Hypertensive heart disease with heart failure: Secondary | ICD-10-CM | POA: Diagnosis present

## 2019-08-05 DIAGNOSIS — R062 Wheezing: Secondary | ICD-10-CM | POA: Diagnosis not present

## 2019-08-05 DIAGNOSIS — E271 Primary adrenocortical insufficiency: Secondary | ICD-10-CM | POA: Diagnosis not present

## 2019-08-05 DIAGNOSIS — T402X5A Adverse effect of other opioids, initial encounter: Secondary | ICD-10-CM | POA: Diagnosis present

## 2019-08-05 DIAGNOSIS — E039 Hypothyroidism, unspecified: Secondary | ICD-10-CM | POA: Diagnosis not present

## 2019-08-05 DIAGNOSIS — Z720 Tobacco use: Secondary | ICD-10-CM | POA: Diagnosis present

## 2019-08-05 DIAGNOSIS — R05 Cough: Secondary | ICD-10-CM | POA: Diagnosis not present

## 2019-08-05 LAB — POC SARS CORONAVIRUS 2 AG -  ED: SARS Coronavirus 2 Ag: NEGATIVE

## 2019-08-05 LAB — CBC WITH DIFFERENTIAL/PLATELET
Abs Immature Granulocytes: 0.08 10*3/uL — ABNORMAL HIGH (ref 0.00–0.07)
Basophils Absolute: 0.1 10*3/uL (ref 0.0–0.1)
Basophils Relative: 1 %
Eosinophils Absolute: 0.1 10*3/uL (ref 0.0–0.5)
Eosinophils Relative: 1 %
HCT: 49.8 % — ABNORMAL HIGH (ref 36.0–46.0)
Hemoglobin: 16.1 g/dL — ABNORMAL HIGH (ref 12.0–15.0)
Immature Granulocytes: 1 %
Lymphocytes Relative: 21 %
Lymphs Abs: 3.4 10*3/uL (ref 0.7–4.0)
MCH: 28 pg (ref 26.0–34.0)
MCHC: 32.3 g/dL (ref 30.0–36.0)
MCV: 86.8 fL (ref 80.0–100.0)
Monocytes Absolute: 0.7 10*3/uL (ref 0.1–1.0)
Monocytes Relative: 4 %
Neutro Abs: 12 10*3/uL — ABNORMAL HIGH (ref 1.7–7.7)
Neutrophils Relative %: 72 %
Platelets: 484 10*3/uL — ABNORMAL HIGH (ref 150–400)
RBC: 5.74 MIL/uL — ABNORMAL HIGH (ref 3.87–5.11)
RDW: 14.5 % (ref 11.5–15.5)
WBC: 16.3 10*3/uL — ABNORMAL HIGH (ref 4.0–10.5)
nRBC: 0 % (ref 0.0–0.2)

## 2019-08-05 LAB — COMPREHENSIVE METABOLIC PANEL
ALT: 11 U/L (ref 0–44)
AST: 25 U/L (ref 15–41)
Albumin: 4.5 g/dL (ref 3.5–5.0)
Alkaline Phosphatase: 101 U/L (ref 38–126)
Anion gap: 11 (ref 5–15)
BUN: 27 mg/dL — ABNORMAL HIGH (ref 6–20)
CO2: 23 mmol/L (ref 22–32)
Calcium: 9.5 mg/dL (ref 8.9–10.3)
Chloride: 104 mmol/L (ref 98–111)
Creatinine, Ser: 1.24 mg/dL — ABNORMAL HIGH (ref 0.44–1.00)
GFR calc Af Amer: 57 mL/min — ABNORMAL LOW (ref >60)
GFR calc non Af Amer: 49 mL/min — ABNORMAL LOW (ref >60)
Glucose, Bld: 90 mg/dL (ref 70–99)
Potassium: 4.8 mmol/L (ref 3.5–5.1)
Sodium: 138 mmol/L (ref 135–145)
Total Bilirubin: 1.1 mg/dL (ref 0.3–1.2)
Total Protein: 8.5 g/dL — ABNORMAL HIGH (ref 6.5–8.1)

## 2019-08-05 LAB — PHOSPHORUS: Phosphorus: 4.4 mg/dL (ref 2.5–4.6)

## 2019-08-05 LAB — MAGNESIUM: Magnesium: 2.2 mg/dL (ref 1.7–2.4)

## 2019-08-05 MED ORDER — OXYCODONE HCL 5 MG PO TABS
15.0000 mg | ORAL_TABLET | Freq: Once | ORAL | Status: AC
  Administered 2019-08-05: 15 mg via ORAL
  Filled 2019-08-05: qty 3

## 2019-08-05 MED ORDER — ACETAMINOPHEN 325 MG PO TABS
650.0000 mg | ORAL_TABLET | Freq: Once | ORAL | Status: AC
  Administered 2019-08-05: 650 mg via ORAL
  Filled 2019-08-05: qty 2

## 2019-08-05 MED ORDER — AEROCHAMBER PLUS FLO-VU LARGE MISC
1.0000 | Freq: Once | Status: AC
  Administered 2019-08-05: 1
  Filled 2019-08-05 (×2): qty 1

## 2019-08-05 MED ORDER — ALBUTEROL SULFATE HFA 108 (90 BASE) MCG/ACT IN AERS
2.0000 | INHALATION_SPRAY | Freq: Once | RESPIRATORY_TRACT | Status: AC
  Administered 2019-08-05: 2 via RESPIRATORY_TRACT
  Filled 2019-08-05: qty 6.7

## 2019-08-05 MED ORDER — SODIUM CHLORIDE 0.9 % IV BOLUS
500.0000 mL | Freq: Once | INTRAVENOUS | Status: AC
  Administered 2019-08-05: 500 mL via INTRAVENOUS

## 2019-08-05 MED ORDER — LORAZEPAM 2 MG/ML IJ SOLN
1.0000 mg | Freq: Once | INTRAMUSCULAR | Status: AC
  Administered 2019-08-05: 1 mg via INTRAVENOUS
  Filled 2019-08-05: qty 1

## 2019-08-05 MED ORDER — DEXAMETHASONE SODIUM PHOSPHATE 10 MG/ML IJ SOLN
10.0000 mg | Freq: Once | INTRAMUSCULAR | Status: AC
  Administered 2019-08-05: 10 mg via INTRAVENOUS
  Filled 2019-08-05: qty 1

## 2019-08-05 NOTE — H&P (Signed)
History and Physical    [name] [id number] DOB: [birth year] DOA: 08/05/2019  PCP: Redmond School, MD   Patient coming from: Home.  I have personally briefly reviewed patient's old medical records in [place]  Chief Complaint: Cough, headache and fever.  HPI: [name] is a 53 y.o. female with medical history significant of Addison's disease, adrenal insufficiency, aneurysm, anxiety, osteoarthritis, CAD, history of cardiac arrest due to addisonian crisis, resolved cardiomyopathy, chronic diastolic heart failure, prolonged QT history, hypertension, chronic back pain, chronic diarrhea history of concussion in 2014, gastroparesis, GERD, hyperlipidemia, hypothyroidism, history of tobacco use, vertigo who is coming to the emergency department with complaints of cough with pleuritic chest pain, headache, dizziness, fever of 102.9 at home, nasal and chest congestion that started on Thursday.  No rhinorrhea or sore throat.she has had also nausea, multiple episodes of emesis today and loose stools.  She mentions that she was exposed on Thursday to some relatives who tested positive for COVID-19 over the weekend.  She denies lower extremity edema, urinary symptoms.  No polyuria, polydipsia, polyphagia or blurred vision.  ED Course: Initial vital signs were temperature 99 F, pulse 89, respiration 18, blood pressure 149/106 mmHg O2 sat 96% on room air.  The patient received a 500 mL NS bolus along with 1 mg of lorazepam IV and oxycodone 15 mg p.o. x1.  White count 16.3, hemoglobin 16.1 g/dL and platelets 484.  Likely due to hemoconcentration.  Her CMP shows normal electrolytes, a BUN of 27, creatinine of 1.24, Glucose of 27. Her LFTs were normal except for a total protein of 8.5 g/dL.  BNP was 59.0 pg/mL.  Review of Systems: As per HPI otherwise 10 point review of systems negative.   Past Medical History:  Diagnosis Date   Addison's disease [facility])    Adrenal insufficiency ([place])      diagnosed 2012   Aneurysm ([place])    Anxiety    Arthritis    Astigmatism    CAD (coronary artery disease)    Cath 2008 EF normal. RCA 50-60, Septal 50%. Myoview 3/12: EF 53% normal perfusion   Cardiac arrest ([place])    2/2 adissonian crisis   Cardiomyopathy    resolved   Chest pain    chronicc   CHF (congestive heart failure) ([facility])    Chronic back pain    Chronic diarrhea    Concussion    sept 28th 2014   Family history of breast cancer    Family history of colon cancer    Gastroparesis    GERD (gastroesophageal reflux disease)    HTN (hypertension)    Hyperlipidemia    Hypothyroidism    Mitral valve prolapse    Nondiabetic gastroparesis    PONV (postoperative nausea and vomiting)    QT prolongation    Tobacco abuse    down to 2 cigarettes per day   Vertigo     Past Surgical History:  Procedure Laterality Date   ABDOMINAL HYSTERECTOMY     CARDIAC CATHETERIZATION  03/2007   showed 60% lesion in the right coronary artery   CHOLECYSTECTOMY     LEFT HEART CATHETERIZATION WITH CORONARY ANGIOGRAM N/A 04/13/2012   Procedure: LEFT HEART CATHETERIZATION WITH CORONARY ANGIOGRAM;  Surgeon: Hillary Bow, MD;  Location: [facility] CATH LAB;  Service: Cardiovascular;  Laterality: N/A;   LUMBAR LAMINECTOMY/DECOMPRESSION MICRODISCECTOMY Left 12/20/2018   Procedure: Left Lumbar Three-Four Lumbar Laminotomy and Foraminotomy, Lumbar Four-Five Laminotomy and Foraminotomy with Microdiscectomy and Resection of Synovial  Cyst;  Surgeon: Jovita Gamma, MD;  Location: [facility];  Service: Neurosurgery;  Laterality: Left;  Left Lumbar 3-4 Lumbar laminotomy, foraminotomy, possible microdiscectomy with possible resection of synovial cyst   [place]       reports that she has been smoking cigarettes. She has a 2.50 pack-year smoking history. She has never used smokeless tobacco. She reports that she  does not drink alcohol or use drugs.  Allergies  Allergen Reactions   Bee Venom Anaphylaxis   Doxycycline Other (See Comments) and Nausea Only    Due to Pre-Existing conditions involved with stomach, patient does not take the following medication   Erythromycin Other (See Comments) and Nausea And Vomiting    Due to Pre-Existing conditions involved with stomach, patient does not take the following medication   Ibuprofen Nausea Only    gastroparesis    Penicillins Anaphylaxis and Swelling    Has patient had a PCN reaction causing immediate rash, facial/tongue/throat swelling, SOB or lightheadedness with hypotension: Yes Has patient had a PCN reaction causing severe rash involving mucus membranes or skin necrosis: Yes Has patient had a PCN reaction that required hospitalization No Has patient had a PCN reaction occurring within the last 10 years: No If all of the above answers are "NO", then may proceed with Cephalosporin use.    Sulfa Antibiotics Hives and Itching   Cat Hair Extract Hives, Itching and Swelling    SWELLING REACTION UNSPECIFIED    Dust Mite Extract Hives and Swelling   Tramadol Diarrhea, Nausea And Vomiting and Nausea Only   Morphine And Related Itching   Sulfonamide Derivatives Itching    Family History  Problem Relation Age of Onset   Cancer Father        Colon   Coronary artery disease Other    Heart attack Mother        d. 70   Breast cancer Paternal Uncle 24   Dementia Maternal Grandmother    Leukemia Maternal Uncle 1   Prior to Admission medications   Medication Sig Start Date End Date Taking? Authorizing Provider  amLODipine (NORVASC) 10 MG tablet Take 10 mg by mouth daily.  04/05/17  Yes [provider]  aspirin 81 MG chewable tablet Chew 81 mg by mouth daily.    Yes [provider]  atorvastatin (LIPITOR) 20 MG tablet Take 20 mg by mouth every morning.    Yes [provider]  cholecalciferol (VITAMIN D) 1000  UNITS tablet Take 1,000 Units by mouth daily.   Yes [provider]  cyclobenzaprine (FLEXERIL) 10 MG tablet Take 10 mg by mouth 3 (three) times daily as needed for muscle spasms.  10/11/18  Yes [provider]  furosemide (LASIX) 20 MG tablet Take 20 mg by mouth daily as needed for fluid.    Yes [provider]  gabapentin (NEURONTIN) 600 MG tablet Take 600 mg by mouth 4 (four) times daily. 07/25/19  Yes [provider]  hydrochlorothiazide (HYDRODIURIL) 12.5 MG tablet Take 6.25 mg by mouth daily as needed (for High Blood Pressure >145/98). Second line medication.   Yes [provider]  levothyroxine (SYNTHROID, LEVOTHROID) 75 MCG tablet Take 75 mcg by mouth daily before breakfast.   Yes [provider]  oxyCODONE (ROXICODONE) 15 MG immediate release tablet Take 15 mg by mouth every 4 (four) hours as needed. 07/27/19  Yes [provider]  pantoprazole ([place])  40 MG tablet Take 40 mg by mouth daily. 06/26/19  Yes [provider]  zolpidem (AMBIEN) 10 MG tablet Take 10 mg by mouth at bedtime. 05/06/16  Yes [provider]  hydrocortisone (CORTEF) 10 MG tablet Take 10-20 mg by mouth See admin instructions. Takes 20 mg in the morning and 10 mg at night 04/07/16   [provider]  meclizine (ANTIVERT) 25 MG tablet 1 or 2 tabs PO q8h prn dizziness Patient taking differently: Take 25-50 mg by mouth 3 (three) times daily as needed (for vertigo).  04/16/16   Francine Graven, DO  nitroGLYCERIN (NITROSTAT) 0.4 MG SL tablet Place 0.4 mg under the tongue every 5 (five) minutes as needed for chest pain.    [provider]  ondansetron (ZOFRAN ODT) 4 MG disintegrating tablet Take 1 tablet (4 mg total) by mouth every 8 (eight) hours as needed for nausea. 12/11/17   Noemi Chapel, MD    Physical Exam: Vitals:   08/05/19 2130 08/05/19 2200 08/05/19 2230 08/05/19 2254  BP: 127/77 135/84 130/77 134/77  Pulse: 82 81  84   Resp:    19  Temp:    (!) 97.4 F (36.3 C)  TempSrc:    Oral  SpO2: 95% 97%  98%  Weight:      Height:        Constitutional: NAD, calm, comfortable Eyes: PERRL, lids and conjunctivae normal ENMT: Mucous membranes are moist. Posterior pharynx clear of any exudate or lesions. Neck: normal, supple, no masses, no thyromegaly Respiratory: clear to auscultation bilaterally, no wheezing, no crackles. Normal respiratory effort. No accessory muscle use.  Cardiovascular: Regular rate and rhythm, no murmurs / rubs / gallops. No extremity edema. 2+ pedal pulses. No carotid bruits.  Abdomen: no tenderness, no masses palpated. No hepatosplenomegaly. Bowel sounds positive.  Musculoskeletal: no clubbing / cyanosis. Good ROM, no contractures. Normal muscle tone.  Skin: no rashes, lesions, ulcers. No induration Neurologic: CN 2-12 grossly intact. Sensation intact, DTR normal. Strength 5/5 in all 4.  Psychiatric: Normal judgment and insight. Alert and oriented x 3. Normal mood.   Labs on Admission: I have personally reviewed following labs and imaging studies  CBC: Recent Labs  Lab 08/05/19 1958  WBC 16.3*  NEUTROABS 12.0*  HGB 16.1*  HCT 49.8*  MCV 86.8  PLT 123456*   Basic Metabolic Panel: Recent Labs  Lab 08/05/19 1958  NA 138  K 4.8  CL 104  CO2 23  GLUCOSE 90  BUN 27*  CREATININE 1.24*  CALCIUM 9.5  MG 2.2  PHOS 4.4   GFR: Estimated Creatinine Clearance: 53.2 mL/min (A) (by C-G formula based on SCr of 1.24 mg/dL (H)). Liver Function Tests: Recent Labs  Lab 08/05/19 1958  AST 25  ALT 11  ALKPHOS 101  BILITOT 1.1  PROT 8.5*  ALBUMIN 4.5   No results for input(s): LIPASE, AMYLASE in the last 168 hours. No results for input(s): AMMONIA in the last 168 hours. Coagulation Profile: No results for input(s): INR, PROTIME in the last 168 hours. Cardiac Enzymes: No results for input(s): CKTOTAL, CKMB, CKMBINDEX, TROPONINI in the last 168 hours. BNP (last 3 results) No  results for input(s): PROBNP in the last 8760 hours. HbA1C: No results for input(s): HGBA1C in the last 72 hours. CBG: No results for input(s): GLUCAP in the last 168 hours. Lipid Profile: No results for input(s): CHOL, HDL, LDLCALC, TRIG, CHOLHDL, LDLDIRECT in the last 72 hours. Thyroid Function Tests: No results for input(s): TSH, T4TOTAL,  FREET4, T3FREE, THYROIDAB in the last 72 hours. Anemia Panel: No results for input(s): VITAMINB12, FOLATE, FERRITIN, TIBC, IRON, RETICCTPCT in the last 72 hours. Urine analysis:    Component Value Date/Time   COLORURINE YELLOW 10/19/2018 1516   APPEARANCEUR HAZY (A) 10/19/2018 1516   LABSPEC 1.010 10/19/2018 1516   PHURINE 5.0 10/19/2018 1516   GLUCOSEU NEGATIVE 10/19/2018 1516   HGBUR SMALL (A) 10/19/2018 1516   BILIRUBINUR NEGATIVE 10/19/2018 1516   KETONESUR NEGATIVE 10/19/2018 1516   PROTEINUR NEGATIVE 10/19/2018 1516   UROBILINOGEN 1.0 08/04/2014 1030   NITRITE NEGATIVE 10/19/2018 1516   LEUKOCYTESUR SMALL (A) 10/19/2018 1516    Radiological Exams on Admission: Dg Chest Port 1 View  Result Date: 08/05/2019 CLINICAL DATA:  Cough. EXAM: PORTABLE CHEST 1 VIEW COMPARISON:  November 10, 2017 FINDINGS: The heart, hila, and mediastinum are normal. No pneumothorax. No masses or infiltrates. A small density projected over the right first costochondral junction was not seen previously. No other evidence of nodules. IMPRESSION: 1. No acute abnormalities are identified. 2. Small nodular density projected over the right first costochondral junction. While I suspect this may represent degenerative changes at the first costochondral junction or confluence of shadows, further evaluation is recommended as the finding was not seen previously. This could be evaluated with a lordotic chest x-ray or CT scan. Suspicion is low. Electronically Signed   By: Dorise Bullion III M.D   On: 08/05/2019 19:18    EKG: Independently reviewed.  Vent. rate 82 BPM PR interval  118 ms QRS duration 92 ms QT/QTc 386/450 ms P-R-T axes 71 87 39 Normal sinus rhythm Normal EKG.  Assessment/Plan Principal Problem:   Viral syndrome History of exposure to Covid. Negative SARS coronavirus 2 antigen. PCR test is still pending. History of Addison's disease.  Overlapping of symptoms could be a possibility. Continue IV hydration. Continue IV glucocorticoid. Follow-up labs.  Active Problems:   Addison's disease ([place]) Mild addisonian crisis versus viral syndrome? Continue IV hydration. Continue glucocorticoids. Transition to oral glucocorticoids. Monitor clinically.    AKI (acute kidney injury) ([place]) Continue IV hydration. Follow-up renal function electrolytes.    Hypothyroidism Continue Synthroid 75 mcg p.o. daily.    Hyperlipidemia Continue atorvastatin 20 mg p.o. daily.    Essential hypertension Hold hydrochlorothiazide. Continue amlodipine.    CAD (coronary artery disease) Continue aspirin    GERD (gastroesophageal reflux disease) Continue PPI.    Chronic pain Continue gabapentin, Flexeril and oxycodone.    Tobacco abuse Nicotine replacement therapy as needed.   DVT prophylaxis: SCDs. Code Status: Full code. Family Communication:  Disposition Plan: Observation for IV hydration, IV glucocorticoids. Consults called: Admission status: Observation/telemetry.   Reubin Milan MD [facility]  If 7PM-7AM, please contact night-coverage [email or web address]  08/05/2019, 11:53 PM   This document was prepared using Dragon voice recognition software and may contain some unintended transcription errors.

## 2019-08-05 NOTE — ED Notes (Signed)
Report to Edith Endave, Therapist, sports

## 2019-08-05 NOTE — ED Provider Notes (Signed)
[facility] EMERGENCY DEPARTMENT Provider Note   CSN: [id number] Arrival date & time: 08/05/19  1658     History   Chief Complaint Chief Complaint  Patient presents with  . Cough    HPI [name] is a 53 y.o. female with a history of adrenal insufficiency, anxiety, CHF last EF 55-60%, HTN, hyperlipidemia, vertigo, hypothyroidism, QT prolongation, CAD, & GERD who presents to the ED with multiple complaints over the past 2 weeks. Patient states sxs started as nasal congestion, ear discomfort, sore throat, & with dizziness, she was placed on a course of keflex by PCP without relief of sxs. She has started to feel worse over the past 5 days as she feels the congestion in her nose has traveled to her chest- she is now having productive cough with yellow phlegm sputum, wheezing dyspnea, body aches, chills, fever to 102.9, nausea, vomiting, & diarrhea. Her ribs are sore from coughing. URI sxs & dizziness have persisted. Dizziness is described as worse with head position changes. She takes meclizine for vertigo. Denies chest pain, dyspnea, abdominal pain, syncope, numbness, weakness, or visual disturbance. She takes 20 mg of hydrocortisone in the AM & 10mg  in the PM. Her family came over to bring her food for thanksgiving and they all started feeling poorly the next day and several tested positive for covid yesterday.      HPI  Past Medical History:  Diagnosis Date  . Addison's disease ([place])   . Adrenal insufficiency ([place])    diagnosed 2012  . Aneurysm ([place])   . Anxiety   . Arthritis   . Astigmatism   . CAD (coronary artery disease)    Cath 2008 EF normal. RCA 50-60, Septal 50%. Myoview 3/12: EF 53% normal perfusion  . Cardiac arrest ([place])    2/2 adissonian crisis  . Cardiomyopathy    resolved  . Chest pain    chronicc  . CHF (congestive heart failure) ([place])   . Chronic back pain   . Chronic diarrhea   . Concussion    sept 28th 2014  . Family history of breast cancer   .  Family history of colon cancer   . Gastroparesis   . GERD (gastroesophageal reflux disease)   . HTN (hypertension)   . Hyperlipidemia   . Hypothyroidism   . Mitral valve prolapse   . Nondiabetic gastroparesis   . PONV (postoperative nausea and vomiting)   . QT prolongation   . Tobacco abuse    down to 2 cigarettes per day  . Vertigo     Patient Active Problem List   Diagnosis Date Noted  . History of colonic polyps 07/23/2019  . Family history of breast cancer   . Family history of colon cancer   . Synovial cyst of lumbar spine 12/20/2018  . Myositis 05/11/2016  . Anxiety 05/26/2015  . Tremors of nervous system 05/26/2015  . Neck pain 05/26/2015  . Hot flushes, perimenopausal 07/24/2013  . CAD (coronary artery disease) 04/11/2012  . GERD (gastroesophageal reflux disease) 04/11/2012  . Addison's disease ([place]) 03/03/2012  . QT prolongation 12/15/2010  . Palpitations 01/01/2009  . Hypothyroidism 12/31/2008  . Hyperlipidemia 12/31/2008  . OVERWEIGHT/OBESITY 12/31/2008  . Essential hypertension 12/31/2008    Past Surgical History:  Procedure Laterality Date  . ABDOMINAL HYSTERECTOMY    . CARDIAC CATHETERIZATION  03/2007   showed 60% lesion in the right coronary artery  . CHOLECYSTECTOMY    . LEFT HEART CATHETERIZATION WITH CORONARY ANGIOGRAM N/A 04/13/2012  Procedure: LEFT HEART CATHETERIZATION WITH CORONARY ANGIOGRAM;  Surgeon: Hillary Bow, MD;  Location: [facility] CATH LAB;  Service: Cardiovascular;  Laterality: N/A;  . LUMBAR LAMINECTOMY/DECOMPRESSION MICRODISCECTOMY Left 12/20/2018   Procedure: Left Lumbar Three-Four Lumbar Laminotomy and Foraminotomy, Lumbar Four-Five Laminotomy and Foraminotomy with Microdiscectomy and Resection of Synovial Cyst;  Surgeon: Jovita Gamma, MD;  Location: [place] [facility];  Service: Neurosurgery;  Laterality: Left;  Left Lumbar 3-4 Lumbar laminotomy, foraminotomy, possible microdiscectomy with possible resection of synovial cyst  . SPINE SURGERY     . VARICOSE VEIN SURGERY    . VESICOVAGINAL FISTULA CLOSURE W/ TAH       OB History    Gravida  3   Para  2   Term  2   Preterm  0   AB  1   Living  2     SAB  1   TAB      Ectopic      Multiple      Live Births               Home Medications    Prior to Admission medications   Medication Sig Start Date End Date Taking? Authorizing Provider  amLODipine (NORVASC) 10 MG tablet Take 10 mg by mouth daily.  04/05/17   [provider]  aspirin 81 MG chewable tablet Chew 81 mg by mouth daily.     [provider]  atorvastatin (LIPITOR) 20 MG tablet Take 20 mg by mouth every morning.     [provider]  cholecalciferol (VITAMIN D) 1000 UNITS tablet Take 1,000 Units by mouth daily.    [provider]  cyclobenzaprine (FLEXERIL) 10 MG tablet Take 10 mg by mouth 3 (three) times daily as needed for muscle spasms.  10/11/18   [provider]  furosemide (LASIX) 20 MG tablet Take 20 mg by mouth daily as needed for fluid.     [provider]  gabapentin (NEURONTIN) 300 MG capsule Take 300 mg by mouth 4 (four) times daily.  10/11/18   [provider]  hydrochlorothiazide (HYDRODIURIL) 12.5 MG tablet Take 6.25 mg by mouth daily as needed (for High Blood Pressure >145/98). Second line medication.    [provider]  hydrocortisone (CORTEF) 10 MG tablet Take 10-20 mg by mouth See admin instructions. Takes 20 mg in the morning and 10 mg at night 04/07/16   [provider]  levothyroxine (SYNTHROID, LEVOTHROID) 75 MCG tablet Take 75 mcg by mouth daily before breakfast.    [provider]  meclizine (ANTIVERT) 25 MG tablet 1 or 2 tabs PO q8h prn dizziness Patient taking differently: Take 25-50 mg by mouth 3 (three) times daily as needed (for vertigo).  04/16/16   Francine Graven, DO  nitroGLYCERIN (NITROSTAT) 0.4 MG SL tablet Place 0.4 mg under the tongue every 5 (five) minutes as needed for chest pain.     [provider]  ondansetron (ZOFRAN ODT) 4 MG disintegrating tablet Take 1 tablet (4 mg total) by mouth every 8 (eight) hours as needed for nausea. 12/11/17   Noemi Chapel, MD  pantoprazole (PROTONIX) 20 MG tablet Take 1 tablet (20 mg total) by mouth daily. Patient taking differently: Take 20 mg by mouth daily as needed for indigestion.  02/04/16   Ward, Ozella Almond, PA-C  zolpidem (AMBIEN) 10 MG tablet Take 10 mg by mouth at bedtime. 05/06/16   [provider]    Family History Family History  Problem Relation Age of Onset  .  Cancer Father        Colon  . Coronary artery disease Other   . Heart attack Mother        d. 74  . Breast cancer Paternal Uncle 75  . Dementia Maternal Grandmother   . Leukemia Maternal Uncle 1    Social History Social History   Tobacco Use  . Smoking status: Current Every Day Smoker    Packs/day: 0.25    Years: 10.00    Pack years: 2.50    Types: Cigarettes  . Smokeless tobacco: Never Used  . Tobacco comment: smokes 2 a day  Substance Use Topics  . Alcohol use: No  . Drug use: No    Comment: Hisrory of Cocaine use      Allergies   Bee venom, Doxycycline, Erythromycin, Ibuprofen, Penicillins, Sulfa antibiotics, Cat hair extract, Dust mite extract, Tramadol, Morphine and related, and Sulfonamide derivatives   Review of Systems Review of Systems  Constitutional: Positive for chills and fever.  HENT: Positive for congestion, ear pain and sore throat.   Eyes: Negative for visual disturbance.  Respiratory: Positive for cough, shortness of breath and wheezing.   Cardiovascular: Negative for chest pain.       + for rib pain/soreness from coughing  Gastrointestinal: Positive for nausea and vomiting. Negative for abdominal pain, blood in stool and diarrhea.  Genitourinary: Negative for dysuria.  Musculoskeletal: Positive for myalgias.  Neurological: Positive for dizziness. Negative for seizures, syncope, speech difficulty,  weakness, light-headedness, numbness and headaches.  All other systems reviewed and are negative.   Physical Exam Updated Vital Signs BP (!) 149/106 (BP Location: Right Arm)   Pulse 89   Temp 99 F (37.2 C) (Oral)   Resp 18   Ht 5' 5.5" (1.664 m)   Wt 77.1 kg   SpO2 96%   BMI 27.86 kg/m   Physical Exam Vitals signs and nursing note reviewed.  Constitutional:      General: She is not in acute distress.    Appearance: She is well-developed.  HENT:     Head: Normocephalic and atraumatic.     Right Ear: Ear canal normal. Tympanic membrane is not perforated, erythematous, retracted or bulging.     Left Ear: Ear canal normal. Tympanic membrane is not perforated, erythematous, retracted or bulging.     Ears:     Comments: No mastoid erythema/swelling/tenderness.     Nose: Congestion present.     Right Sinus: No maxillary sinus tenderness or frontal sinus tenderness.     Left Sinus: No maxillary sinus tenderness or frontal sinus tenderness.     Mouth/Throat:     Pharynx: Uvula midline. No oropharyngeal exudate or posterior oropharyngeal erythema.     Comments: Posterior oropharynx is symmetric appearing. Patient tolerating own secretions without difficulty. No trismus. No drooling. No hot potato voice. No swelling beneath the tongue, submandibular compartment is soft.  Eyes:     General:        Right eye: No discharge.        Left eye: No discharge.     Extraocular Movements:     Right eye: No nystagmus.     Left eye: No nystagmus.     Conjunctiva/sclera: Conjunctivae normal.     Pupils: Pupils are equal, round, and reactive to light.  Neck:     Musculoskeletal: Normal range of motion and neck supple. No edema or neck rigidity.  Cardiovascular:     Rate and Rhythm: Normal rate and regular rhythm.  Heart sounds: No murmur.  Pulmonary:     Effort: Pulmonary effort is normal. No respiratory distress.     Breath sounds: Wheezing (expiratory throughout) present. No rhonchi or  rales.  Chest:     Chest wall: Tenderness (anterior chest wall) present.  Abdominal:     General: There is no distension.     Palpations: Abdomen is soft.     Tenderness: There is no abdominal tenderness.  Lymphadenopathy:     Cervical: No cervical adenopathy.  Skin:    General: Skin is warm and dry.     Findings: No rash.  Neurological:     Mental Status: She is alert.     Comments: Alert. Clear speech. No facial droop. CNIII-XII grossly intact. Bilateral upper and lower extremities' sensation grossly intact. 5/5 symmetric strength with grip strength and with plantar and dorsi flexion bilaterally. . Normal finger to nose bilaterally. Negative pronator drift. Negative Romberg sign. Gait is steady and intact.   Psychiatric:        Behavior: Behavior normal.    ED Treatments / Results  Labs (all labs ordered are listed, but only abnormal results are displayed) Labs Reviewed  COMPREHENSIVE METABOLIC PANEL - Abnormal; Notable for the following components:      Result Value   BUN 27 (*)    Creatinine, Ser 1.24 (*)    Total Protein 8.5 (*)    GFR calc non Af Amer 49 (*)    GFR calc Af Amer 57 (*)    All other components within normal limits  CBC WITH DIFFERENTIAL/PLATELET - Abnormal; Notable for the following components:   WBC 16.3 (*)    RBC 5.74 (*)    Hemoglobin 16.1 (*)    HCT 49.8 (*)    Platelets 484 (*)    Neutro Abs 12.0 (*)    Abs Immature Granulocytes 0.08 (*)    All other components within normal limits  NOVEL CORONAVIRUS, NAA (HOSP ORDER, SEND-OUT TO REF LAB; TAT 18-24 HRS)  MAGNESIUM    EKG None   Date: 08/05/2019  Rate: 82 bpm  Rhythm: normal sinus rhythm  QRS Axis: normal  Intervals: normal  ST/T Wave abnormalities: normal  Conduction Disutrbances: none  Narrative Interpretation: NSR, no STEMI   Radiology Dg Chest Port 1 View  Result Date: 08/05/2019 CLINICAL DATA:  Cough. EXAM: PORTABLE CHEST 1 VIEW COMPARISON:  November 10, 2017 FINDINGS: The heart,  hila, and mediastinum are normal. No pneumothorax. No masses or infiltrates. A small density projected over the right first costochondral junction was not seen previously. No other evidence of nodules. IMPRESSION: 1. No acute abnormalities are identified. 2. Small nodular density projected over the right first costochondral junction. While I suspect this may represent degenerative changes at the first costochondral junction or confluence of shadows, further evaluation is recommended as the finding was not seen previously. This could be evaluated with a lordotic chest x-ray or CT scan. Suspicion is low. Electronically Signed   By: Dorise Bullion III M.D   On: 08/05/2019 19:18    Procedures Procedures (including critical care time)  Medications Ordered in ED Medications  albuterol (VENTOLIN HFA) 108 (90 Base) MCG/ACT inhaler 2 puff (2 puffs Inhalation Given 08/05/19 1939)  AeroChamber Plus Flo-Vu Large MISC 1 each (1 each Other Given 08/05/19 1939)  LORazepam (ATIVAN) injection 1 mg (1 mg Intravenous Given 08/05/19 1959)  dexamethasone (DECADRON) injection 10 mg (10 mg Intravenous Given 08/05/19 1959)  acetaminophen (TYLENOL) tablet 650 mg (650 mg Oral  Given 08/05/19 1940)  sodium chloride 0.9 % bolus 500 mL (500 mLs Intravenous New Bag/Given 08/05/19 2052)  oxyCODONE (Oxy IR/ROXICODONE) immediate release tablet 15 mg (15 mg Oral Given 08/05/19 2053)     Initial Impression / Assessment and Plan / ED Course  I have reviewed the triage vital signs and the nursing notes.  Pertinent labs & imaging results that were available during my care of the patient were reviewed by me and considered in my medical decision making (see chart for details).   Patient presents to the emergency department with 2 weeks of URI symptoms with intermittent dizziness now with fever, chills, cough, wheezing, & N/V/D over the past 5 days with known covid 19 exposure.  Patient nontoxic-appearing, resting comfortably initial  vitals notable for somewhat elevated BP, doubt HTN emergency.  On exam she does have nasal congestion, expiratory wheezing throughout, and some anterior chest wall tenderness.  No abdominal tenderness or peritoneal signs.  Plan for labs, chest x-ray, EKG. will administer albuterol, steroids, Tylenol, and fluids.  Ativan ordered as well to help with dizziness as well as nausea/vomiting.  CBC: Leukocytosis 16.3 with left shift.  Elevated hemoglobin/hematocrit.  No anemia. CMP: Creatinine mildly elevated compared to prior 1.24 today BUN 27, prior 0.83 and 11 respectively.  No significant electrolyte derangement. Magnesium: WNL Chest x-ray: No acute abnormalities, additional findings as above. EKG: Normal sinus rhythm, no ischemic changes  On reassessment patient becoming progressively hypoxic, she dropped to the upper 80s at rest on room air and stayed there persistently, 2 L via nasal cannula was applied with improvement of oxygenation.  Given her hypoxia with known COVID-19 exposure and her constellation of symptoms will consult hospitalist service for admission. Rapid covid testing is negative, send out pending.   22:03: CONSULT: discussed with hospitalist Dr. Olevia Bowens- accepts admission.   Findings and plan of care discussed with supervising physician Dr. Wilson Singer who is in agreement.   [name] was evaluated in Emergency Department on 08/05/2019 for the symptoms described in the history of present illness. He/she was evaluated in the context of the global COVID-19 pandemic, which necessitated consideration that the patient might be at risk for infection with the SARS-CoV-2 virus that causes COVID-19. Institutional protocols and algorithms that pertain to the evaluation of patients at risk for COVID-19 are in a state of rapid change based on information released by regulatory bodies including the CDC and federal and state organizations. These policies and algorithms were followed during the patient's  care in the ED.  Final Clinical Impressions(s) / ED Diagnoses   Final diagnoses:  Acute respiratory failure with hypoxia [facility])  Wheezing    ED Discharge Orders    None       Leafy Kindle 08/05/19 2204    Virgel Manifold, MD 08/05/19 2319

## 2019-08-05 NOTE — ED Triage Notes (Signed)
Pt presents to ED with complaints of cough, headache, dizziness, fever up to 102.9, congestion started on Thursday. Pt exposed to Covid.

## 2019-08-05 NOTE — ED Notes (Signed)
Pt is now on O2/2L   Has a neg poc covid  However, O2 sats decreased quickly and pt improved with O2/2L [place]

## 2019-08-05 NOTE — ED Notes (Signed)
Pt saw Dr Gerarda Fraction for her vertigo Was given antibiotic for a sinus infection  Was brought thanksgiving fixings for thanksgiving dinner by her son in law and then was told later that her daughter had tested positive for covid  Reports she still feels bad and desires an eval   Covid test obtained

## 2019-08-05 NOTE — ED Notes (Signed)
SP, PA in to reassess after complaint of back pain, and pulse ox decrease

## 2019-08-06 ENCOUNTER — Encounter (HOSPITAL_COMMUNITY): Payer: Self-pay

## 2019-08-06 DIAGNOSIS — Z90710 Acquired absence of both cervix and uterus: Secondary | ICD-10-CM | POA: Diagnosis not present

## 2019-08-06 DIAGNOSIS — Z20828 Contact with and (suspected) exposure to other viral communicable diseases: Secondary | ICD-10-CM | POA: Diagnosis present

## 2019-08-06 DIAGNOSIS — E7849 Other hyperlipidemia: Secondary | ICD-10-CM | POA: Diagnosis not present

## 2019-08-06 DIAGNOSIS — E785 Hyperlipidemia, unspecified: Secondary | ICD-10-CM | POA: Diagnosis present

## 2019-08-06 DIAGNOSIS — N179 Acute kidney failure, unspecified: Secondary | ICD-10-CM | POA: Diagnosis present

## 2019-08-06 DIAGNOSIS — T402X5A Adverse effect of other opioids, initial encounter: Secondary | ICD-10-CM | POA: Diagnosis present

## 2019-08-06 DIAGNOSIS — R1115 Cyclical vomiting syndrome unrelated to migraine: Secondary | ICD-10-CM | POA: Diagnosis not present

## 2019-08-06 DIAGNOSIS — I429 Cardiomyopathy, unspecified: Secondary | ICD-10-CM | POA: Diagnosis present

## 2019-08-06 DIAGNOSIS — Z79899 Other long term (current) drug therapy: Secondary | ICD-10-CM | POA: Diagnosis not present

## 2019-08-06 DIAGNOSIS — G8929 Other chronic pain: Secondary | ICD-10-CM | POA: Diagnosis present

## 2019-08-06 DIAGNOSIS — R1110 Vomiting, unspecified: Secondary | ICD-10-CM | POA: Diagnosis present

## 2019-08-06 DIAGNOSIS — I10 Essential (primary) hypertension: Secondary | ICD-10-CM | POA: Diagnosis not present

## 2019-08-06 DIAGNOSIS — F17210 Nicotine dependence, cigarettes, uncomplicated: Secondary | ICD-10-CM | POA: Diagnosis present

## 2019-08-06 DIAGNOSIS — I5032 Chronic diastolic (congestive) heart failure: Secondary | ICD-10-CM | POA: Diagnosis present

## 2019-08-06 DIAGNOSIS — E039 Hypothyroidism, unspecified: Secondary | ICD-10-CM | POA: Diagnosis present

## 2019-08-06 DIAGNOSIS — K5903 Drug induced constipation: Secondary | ICD-10-CM | POA: Diagnosis present

## 2019-08-06 DIAGNOSIS — I110 Hypertensive heart disease with heart failure: Secondary | ICD-10-CM | POA: Diagnosis present

## 2019-08-06 DIAGNOSIS — K3184 Gastroparesis: Secondary | ICD-10-CM | POA: Diagnosis present

## 2019-08-06 DIAGNOSIS — I2510 Atherosclerotic heart disease of native coronary artery without angina pectoris: Secondary | ICD-10-CM | POA: Diagnosis present

## 2019-08-06 DIAGNOSIS — B349 Viral infection, unspecified: Secondary | ICD-10-CM | POA: Diagnosis present

## 2019-08-06 DIAGNOSIS — E271 Primary adrenocortical insufficiency: Secondary | ICD-10-CM | POA: Diagnosis present

## 2019-08-06 DIAGNOSIS — K219 Gastro-esophageal reflux disease without esophagitis: Secondary | ICD-10-CM | POA: Diagnosis present

## 2019-08-06 LAB — BRAIN NATRIURETIC PEPTIDE: B Natriuretic Peptide: 59 pg/mL (ref 0.0–100.0)

## 2019-08-06 LAB — SARS CORONAVIRUS 2 (TAT 6-24 HRS): SARS Coronavirus 2: NEGATIVE

## 2019-08-06 MED ORDER — LEVOTHYROXINE SODIUM 75 MCG PO TABS
75.0000 ug | ORAL_TABLET | Freq: Every day | ORAL | Status: DC
  Administered 2019-08-06 – 2019-08-07 (×2): 75 ug via ORAL
  Filled 2019-08-06 (×2): qty 1

## 2019-08-06 MED ORDER — BISACODYL 10 MG RE SUPP
10.0000 mg | Freq: Once | RECTAL | Status: AC
  Administered 2019-08-06: 10 mg via RECTAL
  Filled 2019-08-06: qty 1

## 2019-08-06 MED ORDER — SODIUM CHLORIDE 0.9 % IV SOLN
INTRAVENOUS | Status: DC
  Administered 2019-08-06 – 2019-08-07 (×3): via INTRAVENOUS

## 2019-08-06 MED ORDER — GABAPENTIN 300 MG PO CAPS
600.0000 mg | ORAL_CAPSULE | Freq: Four times a day (QID) | ORAL | Status: DC
  Administered 2019-08-06 – 2019-08-07 (×6): 600 mg via ORAL
  Filled 2019-08-06 (×6): qty 2

## 2019-08-06 MED ORDER — ZOLPIDEM TARTRATE 5 MG PO TABS
5.0000 mg | ORAL_TABLET | Freq: Every day | ORAL | Status: DC
  Administered 2019-08-06 (×2): 5 mg via ORAL
  Filled 2019-08-06 (×2): qty 1

## 2019-08-06 MED ORDER — AMLODIPINE BESYLATE 5 MG PO TABS
10.0000 mg | ORAL_TABLET | Freq: Every day | ORAL | Status: DC
  Administered 2019-08-06 – 2019-08-07 (×2): 10 mg via ORAL
  Filled 2019-08-06 (×2): qty 2

## 2019-08-06 MED ORDER — HYDROCORTISONE NA SUCCINATE PF 100 MG IJ SOLR
50.0000 mg | Freq: Four times a day (QID) | INTRAMUSCULAR | Status: DC
  Administered 2019-08-06 – 2019-08-07 (×6): 50 mg via INTRAVENOUS
  Filled 2019-08-06 (×6): qty 2

## 2019-08-06 MED ORDER — FENTANYL CITRATE (PF) 100 MCG/2ML IJ SOLN
100.0000 ug | Freq: Once | INTRAMUSCULAR | Status: AC
  Administered 2019-08-06: 100 ug via INTRAVENOUS
  Filled 2019-08-06: qty 2

## 2019-08-06 MED ORDER — SODIUM CHLORIDE 0.9 % IV BOLUS
1000.0000 mL | Freq: Once | INTRAVENOUS | Status: AC
  Administered 2019-08-06: 1000 mL via INTRAVENOUS

## 2019-08-06 MED ORDER — PROCHLORPERAZINE MALEATE 5 MG PO TABS
5.0000 mg | ORAL_TABLET | Freq: Four times a day (QID) | ORAL | Status: DC | PRN
  Administered 2019-08-06: 5 mg via ORAL
  Filled 2019-08-06: qty 1

## 2019-08-06 MED ORDER — AEROCHAMBER PLUS FLO-VU LARGE MISC
1.0000 | Freq: Once | Status: AC
  Administered 2019-08-06: 1
  Filled 2019-08-06: qty 1

## 2019-08-06 MED ORDER — CYCLOBENZAPRINE HCL 10 MG PO TABS
10.0000 mg | ORAL_TABLET | Freq: Three times a day (TID) | ORAL | Status: DC | PRN
  Administered 2019-08-06 – 2019-08-07 (×3): 10 mg via ORAL
  Filled 2019-08-06 (×3): qty 1

## 2019-08-06 MED ORDER — LIDOCAINE 5 % EX PTCH
1.0000 | MEDICATED_PATCH | Freq: Once | CUTANEOUS | Status: AC
  Administered 2019-08-07: 1 via TRANSDERMAL
  Filled 2019-08-06: qty 1

## 2019-08-06 MED ORDER — NITROGLYCERIN 0.4 MG SL SUBL: 0.4000 mg | SUBLINGUAL_TABLET | SUBLINGUAL | Status: DC | PRN

## 2019-08-06 MED ORDER — PROCHLORPERAZINE EDISYLATE 10 MG/2ML IJ SOLN
10.0000 mg | Freq: Once | INTRAMUSCULAR | Status: AC
  Administered 2019-08-06: 10 mg via INTRAVENOUS
  Filled 2019-08-06: qty 2

## 2019-08-06 MED ORDER — PANTOPRAZOLE SODIUM 40 MG PO TBEC
40.0000 mg | DELAYED_RELEASE_TABLET | Freq: Every day | ORAL | Status: DC
  Administered 2019-08-06 – 2019-08-07 (×2): 40 mg via ORAL
  Filled 2019-08-06 (×2): qty 1

## 2019-08-06 MED ORDER — ASPIRIN 81 MG PO CHEW
81.0000 mg | CHEWABLE_TABLET | Freq: Every day | ORAL | Status: DC
  Administered 2019-08-06 – 2019-08-07 (×2): 81 mg via ORAL
  Filled 2019-08-06 (×2): qty 1

## 2019-08-06 MED ORDER — SODIUM CHLORIDE 0.9 % IV BOLUS: 1000.0000 mL | Freq: Once | INTRAVENOUS | Status: DC

## 2019-08-06 MED ORDER — LACTULOSE 10 GM/15ML PO SOLN
30.0000 g | Freq: Once | ORAL | Status: AC
  Administered 2019-08-06: 30 g via ORAL
  Filled 2019-08-06: qty 60

## 2019-08-06 MED ORDER — OXYCODONE HCL 5 MG PO TABS
15.0000 mg | ORAL_TABLET | ORAL | Status: DC | PRN
  Administered 2019-08-06 – 2019-08-07 (×8): 15 mg via ORAL
  Filled 2019-08-06 (×8): qty 3

## 2019-08-06 MED ORDER — ATORVASTATIN CALCIUM 20 MG PO TABS
20.0000 mg | ORAL_TABLET | ORAL | Status: DC
  Administered 2019-08-06 – 2019-08-07 (×2): 20 mg via ORAL
  Filled 2019-08-06 (×3): qty 1

## 2019-08-06 NOTE — Progress Notes (Signed)
Patient called nurse to room. Upon entering patient crying and holding her back. Pt stated that she got a sudden aching pain to her lower back rated at a 10 out of 10. Pt denies numbness and tingling. Patient last given Oxycodone 15mg  PO, given at 2024. Midlevel notified. Instructed by Midlevel to give the oxycodone time to work and see if it helps.

## 2019-08-06 NOTE — Progress Notes (Signed)
Patient Demographics:    [name], is a 53 y.o. female, DOB - Nov 20, 1965, [id number]  Admit date - 08/05/2019   Admitting Physician Reubin Milan, MD  Outpatient Primary MD for the patient is Redmond School, MD  LOS - 0   Chief Complaint  Patient presents with  . Cough        Subjective:    [name] today has no fevers,  No chest pain,   Patient with significant nausea but no further emesis -No productive cough -Complains of malaise, fatigue and aches and pain all over  Assessment  & Plan :    Principal Problem:   Emesis, persistent Active Problems:   AKI (acute kidney injury) ([place])   Viral syndrome   Hypothyroidism   Hyperlipidemia   Essential hypertension   Addison's disease ([place])   CAD (coronary artery disease)   GERD (gastroesophageal reflux disease)   Tobacco abuse  Brief Summary: 53 y.o. female with medical history significant of Addison's disease, adrenal insufficiency, aneurysm, anxiety, osteoarthritis, CAD, history of cardiac arrest due to addisonian crisis, resolved cardiomyopathy, chronic diastolic heart failure, prolonged QT history, hypertension, chronic back pain, chronic diarrhea history of concussion in 2014, gastroparesis, GERD, hyperlipidemia, hypothyroidism, history of tobacco use, vertigo   A/p 1) intractable emesis ----improving with antiemetics and hydration, significant nausea persist, but patient is willing to try liquid diet at this time -Abdominal x-rays with constipation but no obstructive pattern -  2) known COVID-19 exposure--- continue droplet isolation pending final results of Covid testing  3) Addison's disease--- continue steroids okay to give stress dose  4)AKI----acute kidney injury due to intractable emesis,   -creatinine is 1.24,    , renally adjust medications, avoid nephrotoxic agents/dehydration/hypotension -Continue IV fluids  -Continue to hold HCTZ   Disposition/Need for in-Hospital Stay- patient unable to be discharged at this time due to intractable emesis inability to tolerate oral intake requiring IV fluids -Continue IV fluids until oral intake is reliable*  Code Status : full  Family Communication:   NA (patient is alert, awake and coherent)   Disposition Plan  : TBD  Consults  :  na  DVT Prophylaxis  :   SCDs   Lab Results  Component Value Date   PLT 484 (H) 08/05/2019    Inpatient Medications  Scheduled Meds: . AeroChamber Plus Flo-Vu Large  1 each Other Once  . amLODipine  10 mg Oral Daily  . aspirin  81 mg Oral Daily  . atorvastatin  20 mg Oral [id number]  . gabapentin  600 mg Oral QID  . hydrocortisone sod succinate (SOLU-CORTEF) inj  50 mg Intravenous Q6H  . levothyroxine  75 mcg Oral QAC breakfast  . pantoprazole  40 mg Oral Daily  . zolpidem  5 mg Oral QHS   Continuous Infusions: . sodium chloride 125 mL/hr at 08/06/19 0053   PRN Meds:.cyclobenzaprine, nitroGLYCERIN, oxyCODONE    Anti-infectives (From admission, onward)   None        Objective:   Vitals:   08/05/19 2200 08/05/19 2230 08/05/19 2254 08/06/19 0911  BP: 135/84 130/77 134/77 123/72  Pulse: 81  84   Resp:   19   Temp:   (!) 97.4 F (36.3 C)   TempSrc:  Oral   SpO2: 97%  98%   Weight:      Height:        Wt Readings from Last 3 Encounters:  08/05/19 77.1 kg  03/05/19 78 kg  12/20/18 79.8 kg     Intake/Output Summary (Last 24 hours) at 08/06/2019 1524 Last data filed at 08/06/2019 0600 Gross per 24 hour  Intake 1232.87 ml  Output -  Net 1232.87 ml     Physical Exam  Gen:- Awake Alert,  In no apparent distress  HEENT:- [place].AT, No sclera icterus Neck-Supple Neck,No JVD,.  Lungs-  CTAB , fair symmetrical air movement CV- S1, S2 normal, regular  Abd-  +ve B.Sounds, Abd Soft, generalized abdominal tenderness,   no rebound or guarding Extremity/Skin:- No  edema, pedal pulses present   Psych-affect is appropriate, oriented x3 Neuro-generalized weakness, no new focal deficits, no tremors   Data Review:   Micro Results No results found for this or any previous visit (from the past 240 hour(s)).  Radiology Reports Dg Chest Port 1 View  Result Date: 08/05/2019 CLINICAL DATA:  Cough. EXAM: PORTABLE CHEST 1 VIEW COMPARISON:  November 10, 2017 FINDINGS: The heart, hila, and mediastinum are normal. No pneumothorax. No masses or infiltrates. A small density projected over the right first costochondral junction was not seen previously. No other evidence of nodules. IMPRESSION: 1. No acute abnormalities are identified. 2. Small nodular density projected over the right first costochondral junction. While I suspect this may represent degenerative changes at the first costochondral junction or confluence of shadows, further evaluation is recommended as the finding was not seen previously. This could be evaluated with a lordotic chest x-ray or CT scan. Suspicion is low. Electronically Signed   By: Dorise Bullion III M.D   On: 08/05/2019 19:18     CBC Recent Labs  Lab 08/05/19 1958  WBC 16.3*  HGB 16.1*  HCT 49.8*  PLT 484*  MCV 86.8  MCH 28.0  MCHC 32.3  RDW 14.5  LYMPHSABS 3.4  MONOABS 0.7  EOSABS 0.1  BASOSABS 0.1    Chemistries  Recent Labs  Lab 08/05/19 1958  NA 138  K 4.8  CL 104  CO2 23  GLUCOSE 90  BUN 27*  CREATININE 1.24*  CALCIUM 9.5  MG 2.2  AST 25  ALT 11  ALKPHOS 101  BILITOT 1.1   ------------------------------------------------------------------------------------------------------------------ No results for input(s): CHOL, HDL, LDLCALC, TRIG, CHOLHDL, LDLDIRECT in the last 72 hours.  Lab Results  Component Value Date   HGBA1C 5.4 07/30/2013   ------------------------------------------------------------------------------------------------------------------ No results for input(s): TSH, T4TOTAL, T3FREE, THYROIDAB in the last 72 hours.   Invalid input(s): FREET3 ------------------------------------------------------------------------------------------------------------------ No results for input(s): VITAMINB12, FOLATE, FERRITIN, TIBC, IRON, RETICCTPCT in the last 72 hours.  Coagulation profile No results for input(s): INR, PROTIME in the last 168 hours.  No results for input(s): DDIMER in the last 72 hours.  Cardiac Enzymes No results for input(s): CKMB, TROPONINI, MYOGLOBIN in the last 168 hours.  Invalid input(s): CK ------------------------------------------------------------------------------------------------------------------    Component Value Date/Time   BNP 59.0 08/05/2019 1958     Samiya Mervin M.D on 08/06/2019 at 3:24 PM  Go to [email or web address] - for contact info  [facility] - Office  [phone]

## 2019-08-06 NOTE — Progress Notes (Signed)
Patient lying in bed. Patient stated that her pain has eased off. She states that her pain is a 7 out of 10 to her lower back. Patient stated the pain is still aching, but she now has tingling down lower back. Midlevel notified.

## 2019-08-07 DIAGNOSIS — R1115 Cyclical vomiting syndrome unrelated to migraine: Secondary | ICD-10-CM | POA: Diagnosis not present

## 2019-08-07 LAB — RENAL FUNCTION PANEL
Albumin: 3.5 g/dL (ref 3.5–5.0)
Anion gap: 10 (ref 5–15)
BUN: 19 mg/dL (ref 6–20)
CO2: 20 mmol/L — ABNORMAL LOW (ref 22–32)
Calcium: 8.5 mg/dL — ABNORMAL LOW (ref 8.9–10.3)
Chloride: 106 mmol/L (ref 98–111)
Creatinine, Ser: 1.01 mg/dL — ABNORMAL HIGH (ref 0.44–1.00)
GFR calc Af Amer: >60 mL/min (ref >60)
GFR calc non Af Amer: >60 mL/min (ref >60)
Glucose, Bld: 128 mg/dL — ABNORMAL HIGH (ref 70–99)
Phosphorus: 4 mg/dL (ref 2.5–4.6)
Potassium: 4.4 mmol/L (ref 3.5–5.1)
Sodium: 136 mmol/L (ref 135–145)

## 2019-08-07 LAB — CBC
HCT: 42.9 % (ref 36.0–46.0)
Hemoglobin: 13.3 g/dL (ref 12.0–15.0)
MCH: 28.1 pg (ref 26.0–34.0)
MCHC: 31 g/dL (ref 30.0–36.0)
MCV: 90.5 fL (ref 80.0–100.0)
Platelets: 352 10*3/uL (ref 150–400)
RBC: 4.74 MIL/uL (ref 3.87–5.11)
RDW: 14.8 % (ref 11.5–15.5)
WBC: 25.2 10*3/uL — ABNORMAL HIGH (ref 4.0–10.5)
nRBC: 0 % (ref 0.0–0.2)

## 2019-08-07 MED ORDER — POLYETHYLENE GLYCOL 3350 17 G PO PACK: 17.0000 g | PACK | Freq: Every day | ORAL | 3 refills | Status: DC

## 2019-08-07 MED ORDER — SENNOSIDES-DOCUSATE SODIUM 8.6-50 MG PO TABS: 2.0000 | ORAL_TABLET | Freq: Every day | ORAL | 1 refills | Status: AC

## 2019-08-07 MED ORDER — POLYETHYLENE GLYCOL 3350 17 G PO PACK
17.0000 g | PACK | Freq: Every day | ORAL | Status: DC
  Administered 2019-08-07: 17 g via ORAL
  Filled 2019-08-07: qty 1

## 2019-08-07 MED ORDER — ENSURE ENLIVE PO LIQD
237.0000 mL | Freq: Two times a day (BID) | ORAL | Status: DC
  Administered 2019-08-07: 237 mL via ORAL

## 2019-08-07 MED ORDER — ONDANSETRON 4 MG PO TBDP: 4.0000 mg | ORAL_TABLET | Freq: Three times a day (TID) | ORAL | 0 refills | Status: DC | PRN

## 2019-08-07 NOTE — Discharge Summary (Signed)
[name], is a 53 y.o. female  DOB 04/20/1966  MRN [id number].  Admission date:  08/05/2019  Admitting Physician  Reubin Milan, MD  Discharge Date:  08/07/2019   Primary MD  Redmond School, MD  Recommendations for primary care physician for things to follow:   1) take hydrocortisone 20 mg twice a day for 4 days, then go back to taking 20 mg in the morning and 10 mg the evening as you did previously 2) follow-up with endocrinologist Dr. Chalmers Cater as previously scheduled 3) repeat CBC and BMP blood test with the primary care physician in a week or so 4) okay to stop HCTZ/hydrochlorthiazide 5) you have constipation due to chronic use of narcotics/opiates and muscle relaxants--please take MiraLAX and Senokot- s as prescribed for constipation  Admission Diagnosis  Wheezing [R06.2] Acute respiratory failure with hypoxia ([place]) [J96.01]   Discharge Diagnosis  Wheezing [R06.2] Acute respiratory failure with hypoxia ([place]) [J96.01]    Principal Problem:   Emesis, persistent Active Problems:   AKI (acute kidney injury) ([place])   Viral syndrome   Hypothyroidism   Hyperlipidemia   Essential hypertension   Addison's disease ([place])   CAD (coronary artery disease)   GERD (gastroesophageal reflux disease)   Tobacco abuse      Past Medical History:  Diagnosis Date   Addison's disease ([place])    Adrenal insufficiency ([place])    diagnosed 2012   Aneurysm ([place])    Anxiety    Arthritis    Astigmatism    CAD (coronary artery disease)    Cath 2008 EF normal. RCA 50-60, Septal 50%. Myoview 3/12: EF 53% normal perfusion   Cardiac arrest ([place])    2/2 adissonian crisis   Cardiomyopathy    resolved   Chest pain    chronicc   CHF (congestive heart failure) (H[facility])    Chronic back pain    Chronic diarrhea    Concussion    sept 28th 2014   Family history of breast cancer    Family history of  colon cancer    Gastroparesis    GERD (gastroesophageal reflux disease)    HTN (hypertension)    Hyperlipidemia    Hypothyroidism    Mitral valve prolapse    Nondiabetic gastroparesis    PONV (postoperative nausea and vomiting)    QT prolongation    Tobacco abuse    down to 2 cigarettes per day   Vertigo     Past Surgical History:  Procedure Laterality Date   ABDOMINAL HYSTERECTOMY     CARDIAC CATHETERIZATION  03/2007   showed 60% lesion in the right coronary artery   CHOLECYSTECTOMY     LEFT HEART CATHETERIZATION WITH CORONARY ANGIOGRAM N/A 04/13/2012   Procedure: LEFT HEART CATHETERIZATION WITH CORONARY ANGIOGRAM;  Surgeon: Hillary Bow, MD;  Location: [facility] CATH LAB;  Service: Cardiovascular;  Laterality: N/A;   LUMBAR LAMINECTOMY/DECOMPRESSION MICRODISCECTOMY Left 12/20/2018   Procedure: Left Lumbar Three-Four Lumbar Laminotomy and Foraminotomy, Lumbar Four-Five Laminotomy and Foraminotomy with Microdiscectomy and Resection of  Synovial Cyst;  Surgeon: Jovita Gamma, MD;  Location: [place];  Service: Neurosurgery;  Laterality: Left;  Left Lumbar 3-4 Lumbar laminotomy, foraminotomy, possible microdiscectomy with possible resection of synovial cyst   SPINE SURGERY     VARICOSE VEIN SURGERY     VESICOVAGINAL FISTULA CLOSURE W/ TAH       HPI  from the history and physical done on the day of admission:    Chief Complaint: Cough, headache and fever.  HPI: [name] is a 53 y.o. female with medical history significant of Addison's disease, adrenal insufficiency, aneurysm, anxiety, osteoarthritis, CAD, history of cardiac arrest due to addisonian crisis, resolved cardiomyopathy, chronic diastolic heart failure, prolonged QT history, hypertension, chronic back pain, chronic diarrhea history of concussion in 2014, gastroparesis, GERD, hyperlipidemia, hypothyroidism, history of tobacco use, vertigo who is coming to the emergency department with complaints of cough  with pleuritic chest pain, headache, dizziness, fever of 102.9 at home, nasal and chest congestion that started on Thursday.  No rhinorrhea or sore throat.she has had also nausea, multiple episodes of emesis today and loose stools.  She mentions that she was exposed on Thursday to some relatives who tested positive for COVID-19 over the weekend.  She denies lower extremity edema, urinary symptoms.  No polyuria, polydipsia, polyphagia or blurred vision.  ED Course: Initial vital signs were temperature 99 F, pulse 89, respiration 18, blood pressure 149/106 mmHg O2 sat 96% on room air.  The patient received a 500 mL NS bolus along with 1 mg of lorazepam IV and oxycodone 15 mg p.o. x1.  White count 16.3, hemoglobin 16.1 g/dL and platelets 484.  Likely due to hemoconcentration.  Her CMP shows normal electrolytes, a BUN of 27, creatinine of 1.24, Glucose of 27. Her LFTs were normal except for a total protein of 8.5 g/dL.  BNP was 59.0 pg/mL.   Hospital Course:     Brief Summary: 53 y.o.femalewith medical history significant ofAddison's disease, adrenal insufficiency, aneurysm, anxiety, osteoarthritis, CAD, history of cardiac arrest due to addisonian crisis, resolved cardiomyopathy, chronic diastolic heart failure, prolonged QT history, hypertension, chronic back pain, chronic diarrhea history of concussion in 2014, gastroparesis, GERD, hyperlipidemia, hypothyroidism, history of tobacco use, vertigo admitted 08/05/2019 with intractable emesis   A/p 1) intractable emesis ----resolved with antiemetics and hydration, tolerating oral intake well -Abdominal x-rays with constipation but no obstructive pattern -Had bowel movements with laxatives  2) known COVID-19 exposure--- COVID-19 testing is negative  3) Addison's disease---  patient received stress dose steroids, okay to de-escalate back to home regimen  4)AKI----acute kidney injury due to intractable emesis,   -creatinine is back down to 1.0  with hydration   , renally adjust medications, avoid nephrotoxic agents / dehydration /hypotension    [place] home in stable condition  Code Status : full  Family Communication:   NA (patient is alert, awake and coherent)  Follow UP--with PCP and endocrinologist as previously scheduled  Diet and Activity recommendation:  As advised  Discharge Instructions    Discharge Instructions    Call MD for:  difficulty breathing, headache or visual disturbances   Complete by: As directed    Call MD for:  persistant dizziness or light-headedness   Complete by: As directed    Call MD for:  persistant nausea and vomiting   Complete by: As directed    Call MD for:  temperature >100.4   Complete by: As directed    Diet - low sodium heart healthy   Complete by:  As directed    Discharge instructions   Complete by: As directed    1) take hydrocortisone 20 mg twice a day for 4 days, then go back to taking 20 mg in the morning and 10 mg the evening as you did previously 2) follow-up with endocrinologist Dr. Chalmers Cater as previously scheduled 3) repeat CBC and BMP blood test with the primary care physician in a week or so 4) okay to stop HCTZ/hydrochlorthiazide 5) you have constipation due to chronic use of narcotics/opiates and muscle relaxants--please take MiraLAX and Senokot- s as prescribed for constipation   Increase activity slowly   Complete by: As directed         Discharge Medications     Allergies as of 08/07/2019      Reactions   Bee Venom Anaphylaxis   Doxycycline Other (See Comments), Nausea Only   Due to Pre-Existing conditions involved with stomach, patient does not take the following medication   Erythromycin Other (See Comments), Nausea And Vomiting   Due to Pre-Existing conditions involved with stomach, patient does not take the following medication   Ibuprofen Nausea Only   gastroparesis   Penicillins Anaphylaxis, Swelling   Has patient had a PCN  reaction causing immediate rash, facial/tongue/throat swelling, SOB or lightheadedness with hypotension: Yes Has patient had a PCN reaction causing severe rash involving mucus membranes or skin necrosis: Yes Has patient had a PCN reaction that required hospitalization No Has patient had a PCN reaction occurring within the last 10 years: No If all of the above answers are "NO", then may proceed with Cephalosporin use.   Sulfa Antibiotics Hives, Itching   Cat Hair Extract Hives, Itching, Swelling   SWELLING REACTION UNSPECIFIED    Dust Mite Extract Hives, Swelling   Tramadol Diarrhea, Nausea And Vomiting, Nausea Only   Morphine And Related Itching   Sulfonamide Derivatives Itching      Medication List    STOP taking these medications   hydrochlorothiazide 12.5 MG tablet Commonly known as: HYDRODIURIL     TAKE these medications   amLODipine 10 MG tablet Commonly known as: NORVASC Take 10 mg by mouth daily.   aspirin 81 MG chewable tablet Chew 81 mg by mouth daily.   atorvastatin 20 MG tablet Commonly known as: LIPITOR Take 20 mg by mouth every morning.   cholecalciferol 1000 units tablet Commonly known as: VITAMIN D Take 1,000 Units by mouth daily.   cyclobenzaprine 10 MG tablet Commonly known as: FLEXERIL Take 10 mg by mouth 3 (three) times daily as needed for muscle spasms.   furosemide 20 MG tablet Commonly known as: LASIX Take 20 mg by mouth daily as needed for fluid.   gabapentin 600 MG tablet Commonly known as: NEURONTIN Take 600 mg by mouth 4 (four) times daily.   hydrocortisone 10 MG tablet Commonly known as: CORTEF Take 10-20 mg by mouth See admin instructions. Takes 20 mg in the morning and 10 mg at night   levothyroxine 75 MCG tablet Commonly known as: SYNTHROID Take 75 mcg by mouth daily before breakfast.   meclizine 25 MG tablet Commonly known as: ANTIVERT 1 or 2 tabs PO q8h prn dizziness What changed:   how much to take  how to take  this  when to take this  reasons to take this  additional instructions   nitroGLYCERIN 0.4 MG SL tablet Commonly known as: NITROSTAT Place 0.4 mg under the tongue every 5 (five) minutes as needed for chest pain.   ondansetron 4  MG disintegrating tablet Commonly known as: Zofran ODT Take 1 tablet (4 mg total) by mouth every 8 (eight) hours as needed for nausea.   oxyCODONE 15 MG immediate release tablet Commonly known as: ROXICODONE Take 15 mg by mouth every 4 (four) hours as needed.   pantoprazole 40 MG tablet Commonly known as: PROTONIX Take 40 mg by mouth daily.   polyethylene glycol 17 g packet Commonly known as: MIRALAX / GLYCOLAX Take 17 g by mouth daily. Start taking on: August 08, 2019   senna-docusate 8.6-50 MG tablet Commonly known as: Senokot-S Take 2 tablets by mouth at bedtime.   zolpidem 10 MG tablet Commonly known as: AMBIEN Take 10 mg by mouth at bedtime.       Major procedures and Radiology Reports - PLEASE review detailed and final reports for all details, in brief -    Dg Chest Port 1 View  Result Date: 08/05/2019 CLINICAL DATA:  Cough. EXAM: PORTABLE CHEST 1 VIEW COMPARISON:  November 10, 2017 FINDINGS: The heart, hila, and mediastinum are normal. No pneumothorax. No masses or infiltrates. A small density projected over the right first costochondral junction was not seen previously. No other evidence of nodules. IMPRESSION: 1. No acute abnormalities are identified. 2. Small nodular density projected over the right first costochondral junction. While I suspect this may represent degenerative changes at the first costochondral junction or confluence of shadows, further evaluation is recommended as the finding was not seen previously. This could be evaluated with a lordotic chest x-ray or CT scan. Suspicion is low. Electronically Signed   By: Dorise Bullion III M.D   On: 08/05/2019 19:18    Micro Results    Recent Results (from the past 240 hour(s))   SARS CORONAVIRUS 2 (TAT 6-24 HRS) Nasopharyngeal Nasopharyngeal Swab     Status: None   Collection Time: 08/05/19  7:15 PM   Specimen: Nasopharyngeal Swab  Result Value Ref Range Status   SARS Coronavirus 2 NEGATIVE NEGATIVE Final    Comment: (NOTE) SARS-CoV-2 target nucleic acids are NOT DETECTED. The SARS-CoV-2 RNA is generally detectable in upper and lower respiratory specimens during the acute phase of infection. Negative results do not preclude SARS-CoV-2 infection, do not rule out co-infections with other pathogens, and should not be used as the sole basis for treatment or other patient management decisions. Negative results must be combined with clinical observations, patient history, and epidemiological information. The expected result is Negative. Fact Sheet for Patients: SugarRoll.be Fact Sheet for Healthcare Providers: https://www.woods-mathews.com/ This test is not yet approved or cleared by the [place] FDA and  has been authorized for detection and/or diagnosis of SARS-CoV-2 by FDA under an Emergency Use Authorization (EUA). This EUA will remain  in effect (meaning this test can be used) for the duration of the COVID-19 declaration under Section 56 4(b)(1) of the Act, 21 U.S.C. section 360bbb-3(b)(1), unless the authorization is terminated or revoked sooner. Performed at [facility], [place] [address] [place], [place] [address]     Today   Subjective    Edina Bible today has no new complaints -No further emesis, -Eating and drinking well, -Had bowel movement -Voiding well          Patient has been seen and examined prior to discharge   Objective   Blood pressure 115/71, pulse 72, temperature 98.1 F (36.7 C), temperature source Oral, resp. rate 18, height 5' 5.5" (1.664 m), weight 77.1 kg, SpO2 95 %.   Intake/Output Summary (Last 24  hours) at 08/07/2019 1643 Last data filed at 08/07/2019 0900 Gross  per 24 hour  Intake 1897.82 ml  Output --  Net 1897.82 ml    Exam Gen:- Awake Alert, no acute distress  HEENT:- [place].AT, No sclera icterus Neck-Supple Neck,No JVD,.  Lungs-  CTAB , good air movement bilaterally  CV- S1, S2 normal, regular Abd-  +ve B.Sounds, Abd Soft, No tenderness,    Extremity/Skin:- No  edema,   good pulses Psych-affect is appropriate, oriented x3 Neuro-no new focal deficits, no tremors    Data Review   CBC w Diff:  Lab Results  Component Value Date   WBC 25.2 (H) 08/07/2019   HGB 13.3 08/07/2019   HCT 42.9 08/07/2019   PLT 352 08/07/2019   LYMPHOPCT 21 08/05/2019   MONOPCT 4 08/05/2019   EOSPCT 1 08/05/2019   BASOPCT 1 08/05/2019    CMP:  Lab Results  Component Value Date   NA 136 08/07/2019   K 4.4 08/07/2019   CL 106 08/07/2019   CO2 20 (L) 08/07/2019   BUN 19 08/07/2019   CREATININE 1.01 (H) 08/07/2019   PROT 8.5 (H) 08/05/2019   ALBUMIN 3.5 08/07/2019   BILITOT 1.1 08/05/2019   ALKPHOS 101 08/05/2019   AST 25 08/05/2019   ALT 11 08/05/2019  .   Total Discharge time is about 33 minutes  Roxan Hockey M.D on 08/07/2019 at 4:43 PM  Go to [email or web address] -  for contact info  [facility] - Office  [phone]

## 2019-08-07 NOTE — Progress Notes (Signed)
Nsg Discharge Note  Admit Date:  08/05/2019 Discharge date: 08/07/2019   [name] to be D/C'd home per MD order.  AVS completed.  Copy for chart, and copy for patient signed, and dated. Patient/caregiver able to verbalize understanding.  Discharge Medication: Allergies as of 08/07/2019      Reactions   Bee Venom Anaphylaxis   Doxycycline Other (See Comments), Nausea Only   Due to Pre-Existing conditions involved with stomach, patient does not take the following medication   Erythromycin Other (See Comments), Nausea And Vomiting   Due to Pre-Existing conditions involved with stomach, patient does not take the following medication   Ibuprofen Nausea Only   gastroparesis   Penicillins Anaphylaxis, Swelling   Has patient had a PCN reaction causing immediate rash, facial/tongue/throat swelling, SOB or lightheadedness with hypotension: Yes Has patient had a PCN reaction causing severe rash involving mucus membranes or skin necrosis: Yes Has patient had a PCN reaction that required hospitalization No Has patient had a PCN reaction occurring within the last 10 years: No If all of the above answers are "NO", then may proceed with Cephalosporin use.   Sulfa Antibiotics Hives, Itching   Cat Hair Extract Hives, Itching, Swelling   SWELLING REACTION UNSPECIFIED    Dust Mite Extract Hives, Swelling   Tramadol Diarrhea, Nausea And Vomiting, Nausea Only   Morphine And Related Itching   Sulfonamide Derivatives Itching      Medication List    STOP taking these medications   hydrochlorothiazide 12.5 MG tablet Commonly known as: HYDRODIURIL     TAKE these medications   amLODipine 10 MG tablet Commonly known as: NORVASC Take 10 mg by mouth daily.   aspirin 81 MG chewable tablet Chew 81 mg by mouth daily.   atorvastatin 20 MG tablet Commonly known as: LIPITOR Take 20 mg by mouth every morning.   cholecalciferol 1000 units tablet Commonly known as: VITAMIN D Take 1,000 Units by  mouth daily.   cyclobenzaprine 10 MG tablet Commonly known as: FLEXERIL Take 10 mg by mouth 3 (three) times daily as needed for muscle spasms.   furosemide 20 MG tablet Commonly known as: LASIX Take 20 mg by mouth daily as needed for fluid.   gabapentin 600 MG tablet Commonly known as: NEURONTIN Take 600 mg by mouth 4 (four) times daily.   hydrocortisone 10 MG tablet Commonly known as: CORTEF Take 10-20 mg by mouth See admin instructions. Takes 20 mg in the morning and 10 mg at night   levothyroxine 75 MCG tablet Commonly known as: SYNTHROID Take 75 mcg by mouth daily before breakfast.   meclizine 25 MG tablet Commonly known as: ANTIVERT 1 or 2 tabs PO q8h prn dizziness What changed:   how much to take  how to take this  when to take this  reasons to take this  additional instructions   nitroGLYCERIN 0.4 MG SL tablet Commonly known as: NITROSTAT Place 0.4 mg under the tongue every 5 (five) minutes as needed for chest pain.   ondansetron 4 MG disintegrating tablet Commonly known as: Zofran ODT Take 1 tablet (4 mg total) by mouth every 8 (eight) hours as needed for nausea.   oxyCODONE 15 MG immediate release tablet Commonly known as: ROXICODONE Take 15 mg by mouth every 4 (four) hours as needed.   pantoprazole 40 MG tablet Commonly known as: PROTONIX Take 40 mg by mouth daily.   polyethylene glycol 17 g packet Commonly known as: MIRALAX / GLYCOLAX Take 17 g by  mouth daily. Start taking on: August 08, 2019   senna-docusate 8.6-50 MG tablet Commonly known as: Senokot-S Take 2 tablets by mouth at bedtime.   zolpidem 10 MG tablet Commonly known as: AMBIEN Take 10 mg by mouth at bedtime.       Discharge Assessment: Vitals:   08/07/19 0520 08/07/19 1329  BP: 115/71   Pulse: 72   Resp: 18   Temp: 98.1 F (36.7 C)   SpO2: 95% 95%   Skin clean, dry and intact without evidence of skin break down, no evidence of skin tears noted. IV catheter  discontinued intact. Site without signs and symptoms of complications - no redness or edema noted at insertion site, patient denies c/o pain - only slight tenderness at site.  Dressing with slight pressure applied.  D/c Instructions-Education: Discharge instructions given to patient/family with verbalized understanding. D/c education completed with patient/family including follow up instructions, medication list, d/c activities limitations if indicated, with other d/c instructions as indicated by MD - patient able to verbalize understanding, all questions fully answered. Patient instructed to return to ED, call 911, or call MD for any changes in condition.  Patient escorted via [place], and D/C home via private auto.  Venita Sheffield, RN 08/07/2019 5:31 PM

## 2019-08-07 NOTE — Plan of Care (Signed)
  Problem: Education: Goal: Knowledge of General Education information will improve Description Including pain rating scale, medication(s)/side effects and non-pharmacologic comfort measures Outcome: Adequate for Discharge   Problem: Health Behavior/Discharge Planning: Goal: Ability to manage health-related needs will improve Outcome: Adequate for Discharge   

## 2019-08-07 NOTE — Discharge Instructions (Signed)
1) take hydrocortisone 20 mg twice a day for 4 days, then go back to taking 20 mg in the morning and 10 mg the evening as you did previously 2) follow-up with endocrinologist Dr. Chalmers Cater as previously scheduled 3) repeat CBC and BMP blood test with the primary care physician in a week or so 4) okay to stop HCTZ/hydrochlorthiazide 5) you have constipation due to chronic use of narcotics/opiates and muscle relaxants--please take MiraLAX and Senokot- s as prescribed for constipation

## 2019-08-07 NOTE — Progress Notes (Signed)
Initial Nutrition Assessment  DOCUMENTATION CODES:      INTERVENTION:  Ensure Enlive po BID, each supplement provides 350 kcal and 20 grams of protein   Soft diet   NUTRITION DIAGNOSIS:   Increased nutrient needs related to acute illness(respiratory failure- sinus infection) as evidenced by estimated needs.   GOAL:  Patient will meet greater than or equal to 90% of their needs  MONITOR:   Diet advancement, Supplement acceptance, PO intake, Labs, Weight trends  REASON FOR ASSESSMENT:   Malnutrition Screening Tool    ASSESSMENT: Patient is a 53 yo overweight female who presents with acute respiratory failure, sinus infection.  Complaining of back pain and multiple episodes of vomiting. Recent exposure to COVID. Hx of Addison's, CAD, GERD, HTN, Hypothyroid, tobacco abuse.  Diet advanced to Soft. Talked with nurse tech about her intake. No further vomiting episodes reported. Appetite is good 75% of liquids consumed at breakfast today. Feeds herself. RD attempted contact with patient but unable to reach by telephone.   Chart review of weight history indicates loss of ~ 2 lb (1 kg) since June which is not significant. Unable to complete physical exam at this time.   Medications reviewed and include: lipitor, gabapentin, solucortef, synthroid, protonix, Miralax.  IVF- NS @ 125 ml/hr.  Labs: BMP Latest Ref Rng & Units 08/07/2019 08/05/2019 12/08/2018  Glucose 70 - 99 mg/dL 128(H) 90 92  BUN 6 - 20 mg/dL 19 27(H) 11  Creatinine 0.44 - 1.00 mg/dL 1.01(H) 1.24(H) 0.83  Sodium 135 - 145 mmol/L 136 138 136  Potassium 3.5 - 5.1 mmol/L 4.4 4.8 4.0  Chloride 98 - 111 mmol/L 106 104 102  CO2 22 - 32 mmol/L 20(L) 23 24  Calcium 8.9 - 10.3 mg/dL 8.5(L) 9.5 9.0    Diet Order:   Diet Order            DIET SOFT Room service appropriate? Yes; Fluid consistency: Thin  Diet effective now              EDUCATION NEEDS:   No education needs have been identified at this time Skin:   Skin Assessment: Reviewed RN Assessment  Last BM:  11/30  Height:   Ht Readings from Last 1 Encounters:  08/05/19 5' 5.5" (1.664 m)    Weight:   Wt Readings from Last 1 Encounters:  08/05/19 77.1 kg    Ideal Body Weight:  59 kg  BMI:  Body mass index is 27.86 kg/m.  Estimated Nutritional Needs:   Kcal:  1797-1935 (MSJ x 1.3-1.4 AF)  Protein:  83-94 gr (1.4-1.6 gr/kg/ibw)  Fluid:  1.8-1.9 liters daily   Colman Cater MS,RD,CSG,LDN Office: [phone] Pager: [phone]

## 2019-08-13 DIAGNOSIS — E860 Dehydration: Secondary | ICD-10-CM | POA: Diagnosis not present

## 2019-08-13 DIAGNOSIS — I10 Essential (primary) hypertension: Secondary | ICD-10-CM | POA: Diagnosis not present

## 2019-08-13 DIAGNOSIS — N390 Urinary tract infection, site not specified: Secondary | ICD-10-CM | POA: Diagnosis not present

## 2019-08-13 DIAGNOSIS — E2740 Unspecified adrenocortical insufficiency: Secondary | ICD-10-CM | POA: Diagnosis not present

## 2019-08-13 DIAGNOSIS — Z6829 Body mass index (BMI) 29.0-29.9, adult: Secondary | ICD-10-CM | POA: Diagnosis not present

## 2019-08-13 DIAGNOSIS — A419 Sepsis, unspecified organism: Secondary | ICD-10-CM | POA: Diagnosis not present

## 2019-08-21 DIAGNOSIS — R519 Headache, unspecified: Secondary | ICD-10-CM | POA: Diagnosis not present

## 2019-08-24 ENCOUNTER — Telehealth: Payer: Self-pay | Admitting: Genetic Counselor

## 2019-08-24 DIAGNOSIS — G894 Chronic pain syndrome: Secondary | ICD-10-CM | POA: Diagnosis not present

## 2019-08-24 DIAGNOSIS — N390 Urinary tract infection, site not specified: Secondary | ICD-10-CM | POA: Diagnosis not present

## 2019-08-24 NOTE — Telephone Encounter (Signed)
Confirmed that patient received saliva kit.She has been sick and was hospitalized for a while.  She will try to get this turned in by the end of the weekend.

## 2019-08-31 IMAGING — CR LUMBAR SPINE - 2-3 VIEW
2 series · 2 of 2 positions shown · non-contrast
Comparison: Lumbar spine x-rays dated December 01, 2018.

CLINICAL DATA: Lumbar surgery.

EXAM:
LUMBAR SPINE - 2-3 VIEW

[xtable lateral (1 of 2)]
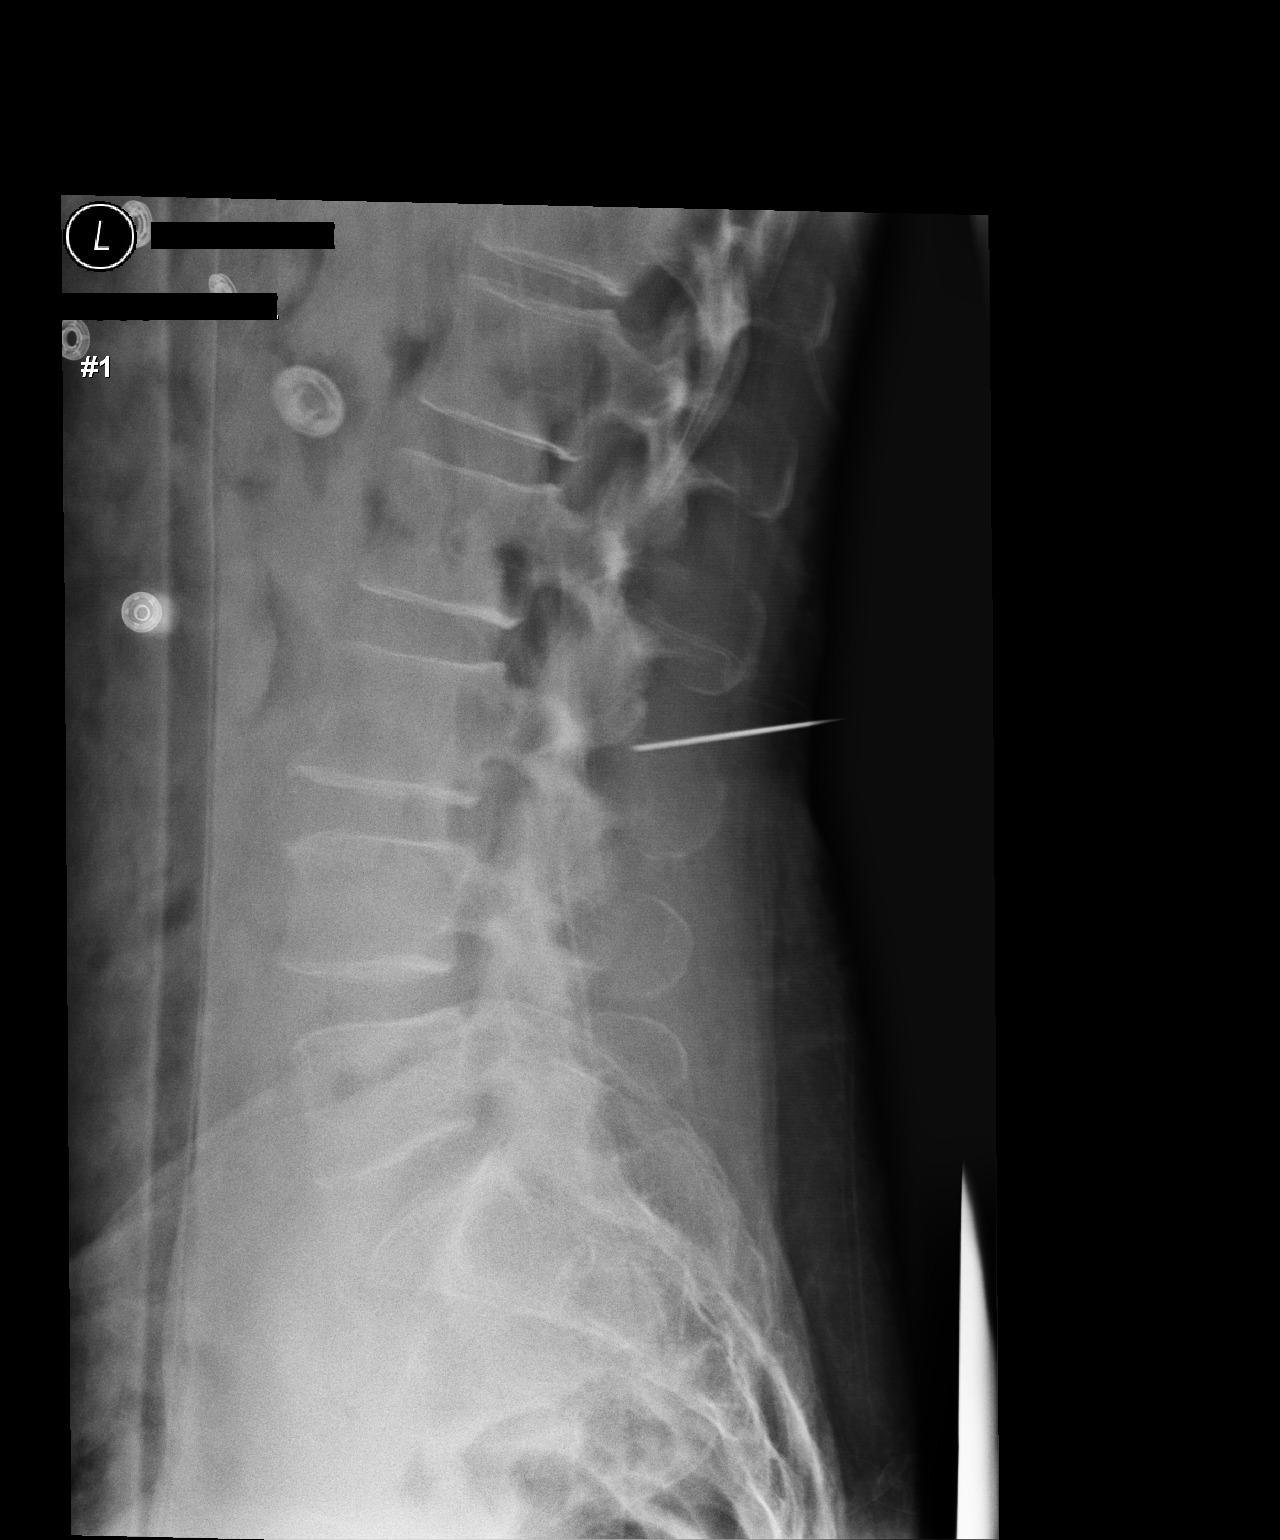

[xtable lateral (2 of 2)]
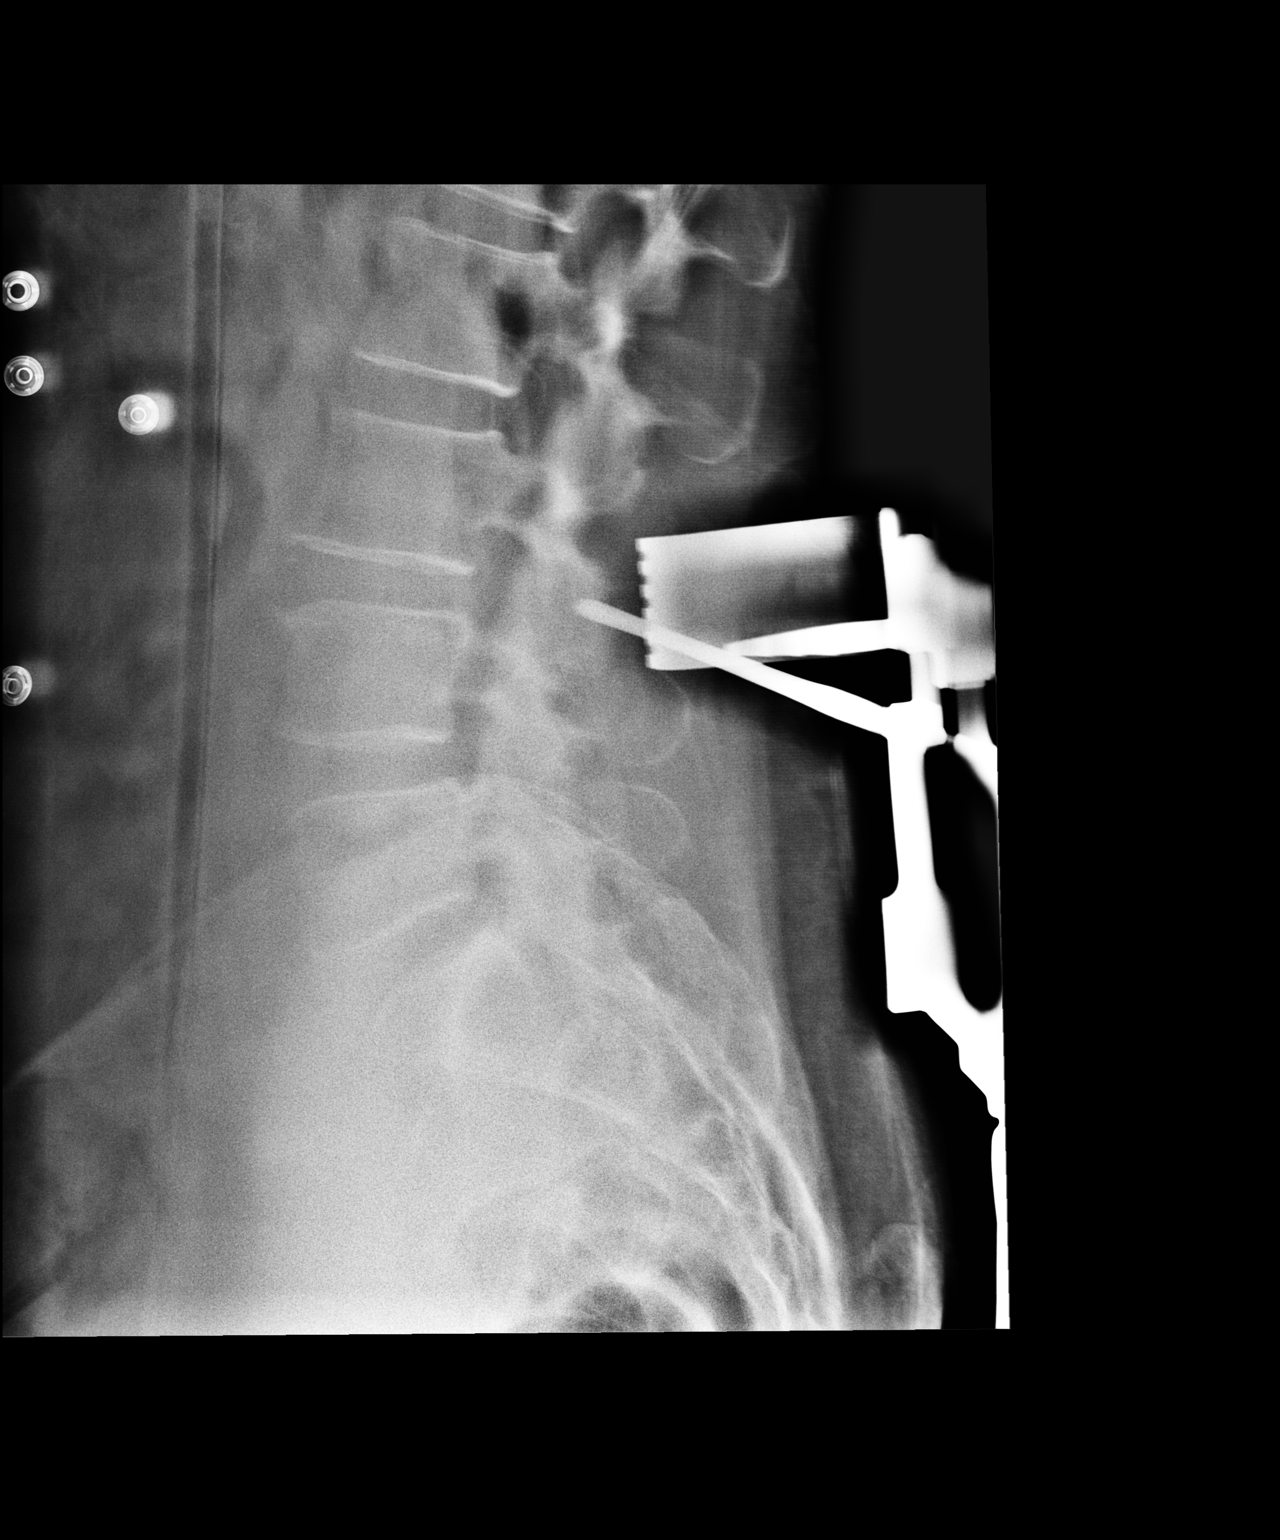

[2 of 2 positions shown; findings below may reference images not displayed]

FINDINGS: Two lateral intraoperative x-rays are submitted for review. The
x-ray labeled #1 demonstrates surgical instrumentation overlying the
L3 spinous process. The x-ray labeled #2 demonstrates surgical
instrumentation posterior to the L3-L4 level.

Unchanged trace anterolisthesis at L4-L5. Alignment is otherwise
normal. Vertebral body heights are preserved.
IMPRESSION: Intraoperative localization as described above.

## 2019-09-06 DIAGNOSIS — M48061 Spinal stenosis, lumbar region without neurogenic claudication: Secondary | ICD-10-CM | POA: Diagnosis not present

## 2019-09-06 DIAGNOSIS — I2510 Atherosclerotic heart disease of native coronary artery without angina pectoris: Secondary | ICD-10-CM | POA: Diagnosis not present

## 2019-09-06 DIAGNOSIS — E271 Primary adrenocortical insufficiency: Secondary | ICD-10-CM | POA: Diagnosis not present

## 2019-09-06 DIAGNOSIS — I10 Essential (primary) hypertension: Secondary | ICD-10-CM | POA: Diagnosis not present

## 2019-09-20 DIAGNOSIS — M48061 Spinal stenosis, lumbar region without neurogenic claudication: Secondary | ICD-10-CM | POA: Diagnosis not present

## 2019-09-20 DIAGNOSIS — M7981 Nontraumatic hematoma of soft tissue: Secondary | ICD-10-CM | POA: Diagnosis not present

## 2019-09-20 DIAGNOSIS — G894 Chronic pain syndrome: Secondary | ICD-10-CM | POA: Diagnosis not present

## 2019-09-20 DIAGNOSIS — R0781 Pleurodynia: Secondary | ICD-10-CM | POA: Diagnosis not present

## 2019-09-24 ENCOUNTER — Telehealth: Payer: Self-pay | Admitting: Genetic Counselor

## 2019-09-24 NOTE — Telephone Encounter (Signed)
Let patient know that testing could not be completed through the saliva kit sent.  We can either 1. Have partial results called out, 2, send in a second saliva kit, 3, go to [facility] or [facility] to get blood drawn, or 4, have mobil phlebotomy sent to her.  She will have mobile phlebotomy sent.  I let the lab know and they will set up.

## 2019-10-07 DIAGNOSIS — M48061 Spinal stenosis, lumbar region without neurogenic claudication: Secondary | ICD-10-CM | POA: Diagnosis not present

## 2019-10-07 DIAGNOSIS — I2510 Atherosclerotic heart disease of native coronary artery without angina pectoris: Secondary | ICD-10-CM | POA: Diagnosis not present

## 2019-10-07 DIAGNOSIS — E063 Autoimmune thyroiditis: Secondary | ICD-10-CM | POA: Diagnosis not present

## 2019-10-07 DIAGNOSIS — E271 Primary adrenocortical insufficiency: Secondary | ICD-10-CM | POA: Diagnosis not present

## 2019-10-12 ENCOUNTER — Ambulatory Visit (HOSPITAL_COMMUNITY): Payer: Medicare HMO

## 2019-10-22 DIAGNOSIS — G894 Chronic pain syndrome: Secondary | ICD-10-CM | POA: Diagnosis not present

## 2019-10-24 ENCOUNTER — Other Ambulatory Visit: Payer: Self-pay

## 2019-10-24 ENCOUNTER — Encounter (HOSPITAL_COMMUNITY): Payer: Self-pay | Admitting: Emergency Medicine

## 2019-10-24 ENCOUNTER — Emergency Department (HOSPITAL_COMMUNITY)
Admission: EM | Admit: 2019-10-24 | Discharge: 2019-10-24 | Disposition: A | Discharge status: Home / Self Care | Payer: Medicare HMO | Attending: Emergency Medicine | Admitting: Emergency Medicine

## 2019-10-24 ENCOUNTER — Emergency Department (HOSPITAL_COMMUNITY): Payer: Medicare HMO

## 2019-10-24 DIAGNOSIS — I10 Essential (primary) hypertension: Secondary | ICD-10-CM | POA: Diagnosis not present

## 2019-10-24 DIAGNOSIS — I2510 Atherosclerotic heart disease of native coronary artery without angina pectoris: Secondary | ICD-10-CM | POA: Diagnosis not present

## 2019-10-24 DIAGNOSIS — R52 Pain, unspecified: Secondary | ICD-10-CM | POA: Diagnosis not present

## 2019-10-24 DIAGNOSIS — Z79899 Other long term (current) drug therapy: Secondary | ICD-10-CM | POA: Diagnosis not present

## 2019-10-24 DIAGNOSIS — R1084 Generalized abdominal pain: Secondary | ICD-10-CM

## 2019-10-24 DIAGNOSIS — I110 Hypertensive heart disease with heart failure: Secondary | ICD-10-CM | POA: Diagnosis not present

## 2019-10-24 DIAGNOSIS — I509 Heart failure, unspecified: Secondary | ICD-10-CM | POA: Diagnosis not present

## 2019-10-24 DIAGNOSIS — F17210 Nicotine dependence, cigarettes, uncomplicated: Secondary | ICD-10-CM | POA: Insufficient documentation

## 2019-10-24 DIAGNOSIS — Z7982 Long term (current) use of aspirin: Secondary | ICD-10-CM | POA: Diagnosis not present

## 2019-10-24 DIAGNOSIS — R112 Nausea with vomiting, unspecified: Secondary | ICD-10-CM | POA: Diagnosis not present

## 2019-10-24 DIAGNOSIS — R1110 Vomiting, unspecified: Secondary | ICD-10-CM

## 2019-10-24 DIAGNOSIS — R0689 Other abnormalities of breathing: Secondary | ICD-10-CM | POA: Diagnosis not present

## 2019-10-24 DIAGNOSIS — R000 Tachycardia, unspecified: Secondary | ICD-10-CM | POA: Diagnosis not present

## 2019-10-24 DIAGNOSIS — R109 Unspecified abdominal pain: Secondary | ICD-10-CM

## 2019-10-24 DIAGNOSIS — E039 Hypothyroidism, unspecified: Secondary | ICD-10-CM | POA: Insufficient documentation

## 2019-10-24 LAB — URINALYSIS, ROUTINE W REFLEX MICROSCOPIC
Bilirubin Urine: NEGATIVE
Glucose, UA: NEGATIVE mg/dL
Ketones, ur: NEGATIVE mg/dL
Nitrite: NEGATIVE
Protein, ur: NEGATIVE mg/dL
Specific Gravity, Urine: 1.016 (ref 1.005–1.030)
pH: 5 (ref 5.0–8.0)

## 2019-10-24 LAB — COMPREHENSIVE METABOLIC PANEL
ALT: 12 U/L (ref 0–44)
AST: 17 U/L (ref 15–41)
Albumin: 4.3 g/dL (ref 3.5–5.0)
Alkaline Phosphatase: 85 U/L (ref 38–126)
Anion gap: 11 (ref 5–15)
BUN: 31 mg/dL — ABNORMAL HIGH (ref 6–20)
CO2: 26 mmol/L (ref 22–32)
Calcium: 9.5 mg/dL (ref 8.9–10.3)
Chloride: 97 mmol/L — ABNORMAL LOW (ref 98–111)
Creatinine, Ser: 1.32 mg/dL — ABNORMAL HIGH (ref 0.44–1.00)
GFR calc Af Amer: 53 mL/min — ABNORMAL LOW (ref >60)
GFR calc non Af Amer: 45 mL/min — ABNORMAL LOW (ref >60)
Glucose, Bld: 108 mg/dL — ABNORMAL HIGH (ref 70–99)
Potassium: 4 mmol/L (ref 3.5–5.1)
Sodium: 134 mmol/L — ABNORMAL LOW (ref 135–145)
Total Bilirubin: 0.6 mg/dL (ref 0.3–1.2)
Total Protein: 8.1 g/dL (ref 6.5–8.1)

## 2019-10-24 LAB — CBC WITH DIFFERENTIAL/PLATELET
Abs Immature Granulocytes: 0.1 10*3/uL — ABNORMAL HIGH (ref 0.00–0.07)
Basophils Absolute: 0 10*3/uL (ref 0.0–0.1)
Basophils Relative: 0 %
Eosinophils Absolute: 0 10*3/uL (ref 0.0–0.5)
Eosinophils Relative: 0 %
HCT: 42.3 % (ref 36.0–46.0)
Hemoglobin: 13.7 g/dL (ref 12.0–15.0)
Immature Granulocytes: 1 %
Lymphocytes Relative: 15 %
Lymphs Abs: 2.5 10*3/uL (ref 0.7–4.0)
MCH: 27.6 pg (ref 26.0–34.0)
MCHC: 32.4 g/dL (ref 30.0–36.0)
MCV: 85.3 fL (ref 80.0–100.0)
Monocytes Absolute: 1.1 10*3/uL — ABNORMAL HIGH (ref 0.1–1.0)
Monocytes Relative: 7 %
Neutro Abs: 12.5 10*3/uL — ABNORMAL HIGH (ref 1.7–7.7)
Neutrophils Relative %: 77 %
Platelets: 448 10*3/uL — ABNORMAL HIGH (ref 150–400)
RBC: 4.96 MIL/uL (ref 3.87–5.11)
RDW: 15 % (ref 11.5–15.5)
WBC: 16.3 10*3/uL — ABNORMAL HIGH (ref 4.0–10.5)
nRBC: 0 % (ref 0.0–0.2)

## 2019-10-24 LAB — LIPASE, BLOOD: Lipase: 15 U/L (ref 11–51)

## 2019-10-24 MED ORDER — ONDANSETRON HCL 4 MG/2ML IJ SOLN
4.0000 mg | Freq: Once | INTRAMUSCULAR | Status: AC
  Administered 2019-10-24: 4 mg via INTRAVENOUS
  Filled 2019-10-24: qty 2

## 2019-10-24 MED ORDER — IOHEXOL 300 MG/ML  SOLN
100.0000 mL | Freq: Once | INTRAMUSCULAR | Status: AC | PRN
  Administered 2019-10-24: 100 mL via INTRAVENOUS

## 2019-10-24 MED ORDER — HYDROMORPHONE HCL 1 MG/ML IJ SOLN
1.0000 mg | Freq: Once | INTRAMUSCULAR | Status: AC
  Administered 2019-10-24: 1 mg via INTRAVENOUS
  Filled 2019-10-24: qty 1

## 2019-10-24 MED ORDER — METHOCARBAMOL 500 MG PO TABS: 500.0000 mg | ORAL_TABLET | Freq: Four times a day (QID) | ORAL | 0 refills | Status: DC | PRN

## 2019-10-24 MED ORDER — HYDROMORPHONE HCL 1 MG/ML IJ SOLN
0.5000 mg | Freq: Once | INTRAMUSCULAR | Status: AC
  Administered 2019-10-24: 23:00:00 0.5 mg via INTRAVENOUS
  Filled 2019-10-24: qty 1

## 2019-10-24 MED ORDER — SODIUM CHLORIDE 0.9 % IV BOLUS
1000.0000 mL | Freq: Once | INTRAVENOUS | Status: AC
  Administered 2019-10-24: 20:00:00 1000 mL via INTRAVENOUS

## 2019-10-24 MED ORDER — HYDROMORPHONE HCL 1 MG/ML IJ SOLN
0.5000 mg | Freq: Once | INTRAMUSCULAR | Status: AC
  Administered 2019-10-24: 0.5 mg via INTRAVENOUS
  Filled 2019-10-24: qty 1

## 2019-10-24 MED ORDER — LIDOCAINE 5 % EX PTCH: 1.0000 | MEDICATED_PATCH | CUTANEOUS | 0 refills | Status: DC

## 2019-10-24 MED ORDER — LIDOCAINE 5 % EX PTCH
1.0000 | MEDICATED_PATCH | CUTANEOUS | Status: DC
  Administered 2019-10-24: 23:00:00 1 via TRANSDERMAL
  Filled 2019-10-24: qty 1

## 2019-10-24 MED ORDER — METHOCARBAMOL 500 MG PO TABS
500.0000 mg | ORAL_TABLET | Freq: Once | ORAL | Status: AC
  Administered 2019-10-24: 500 mg via ORAL
  Filled 2019-10-24: qty 1

## 2019-10-24 NOTE — ED Notes (Signed)
Pt given sprite to sip on 

## 2019-10-24 NOTE — Discharge Instructions (Addendum)
Your lab tests and Ct imaging are reassuring tonight with no abnormal or emergent findings.  I suspect your pain is musculoskeletal including muscle spasm. Continue using your norco, but add robaxin for muscle spasm. The lidoderm patch can also help with pain relief.  Call your primary MD if your symptoms are not improving with this treatment.

## 2019-10-24 NOTE — ED Provider Notes (Signed)
[facility] EMERGENCY DEPARTMENT Provider Note   CSN: [id number] Arrival date & time: 10/24/19  1606    History Chief Complaint  Patient presents with  . Flank Pain    [name] is a 54 y.o. female with history significant for adrenal insufficiency, CHF, chronic back pain, chronic diarrhea who presents for evaluation abdominal pain.  Patient states she has had bilateral flank pain which radiates into her abdomen.  Initial onset 1 week ago however worse over last 24 hours. She denies any midline back pain or midline abdominal pain.  Describes the pain as spasms and cramping. "Feels like Im having a baby."   Has had nausea with NBNB emesis.  Denies fever, chest pain, shortness of breath, dysuria, hematuria, diarrhea, constipation.  Her last bowel movement was this morning.  No melena or bright red blood per rectum.  No recent trauma or injury.  Pain worse with movement.  Has had some chills.  Took Tylenol at home.  Rates her pain a 10/10.  Denies additional aggravating or alleviating factors.  No IV drug use, bowel or bladder incontinence, saddle paresthesias   Hx of nephrolithiasis at age 69.   Currently on Levaquin for URI. Has not been tested for COVID. Cough with green sputum. No CP, SOB.  History obtained from patient and past medical records.  No interpreter is used.  HPI     Past Medical History:  Diagnosis Date  . Addison's disease ([place])   . Adrenal insufficiency ([place])    diagnosed 2012  . Aneurysm ([place])   . Anxiety   . Arthritis   . Astigmatism   . CAD (coronary artery disease)    Cath 2008 EF normal. RCA 50-60, Septal 50%. Myoview 3/12: EF 53% normal perfusion  . Cardiac arrest ([place])    2/2 adissonian crisis  . Cardiomyopathy    resolved  . Chest pain    chronicc  . CHF (congestive heart failure) ([place])   . Chronic back pain   . Chronic diarrhea   . Concussion    sept 28th 2014  . Family history of breast cancer   . Family history of colon cancer   .  Gastroparesis   . GERD (gastroesophageal reflux disease)   . HTN (hypertension)   . Hyperlipidemia   . Hypothyroidism   . Mitral valve prolapse   . Nondiabetic gastroparesis   . PONV (postoperative nausea and vomiting)   . QT prolongation   . Tobacco abuse    down to 2 cigarettes per day  . Vertigo     Patient Active Problem List   Diagnosis Date Noted  . AKI (acute kidney injury) ([place]) 08/06/2019  . Emesis, persistent 08/06/2019  . Viral syndrome 08/05/2019  . Tobacco abuse   . History of colonic polyps 07/23/2019  . Family history of breast cancer   . Family history of colon cancer   . Synovial cyst of lumbar spine 12/20/2018  . Myositis 05/11/2016  . Anxiety 05/26/2015  . Tremors of nervous system 05/26/2015  . Neck pain 05/26/2015  . Hot flushes, perimenopausal 07/24/2013  . CAD (coronary artery disease) 04/11/2012  . GERD (gastroesophageal reflux disease) 04/11/2012  . Addison's disease ([place]) 03/03/2012  . QT prolongation 12/15/2010  . Palpitations 01/01/2009  . Hypothyroidism 12/31/2008  . Hyperlipidemia 12/31/2008  . OVERWEIGHT/OBESITY 12/31/2008  . Essential hypertension 12/31/2008    Past Surgical History:  Procedure Laterality Date  . ABDOMINAL HYSTERECTOMY    . CARDIAC CATHETERIZATION  03/2007  showed 60% lesion in the right coronary artery  . CHOLECYSTECTOMY    . LEFT HEART CATHETERIZATION WITH CORONARY ANGIOGRAM N/A 04/13/2012   Procedure: LEFT HEART CATHETERIZATION WITH CORONARY ANGIOGRAM;  Surgeon: Hillary Bow, MD;  Location: [facility] CATH LAB;  Service: Cardiovascular;  Laterality: N/A;  . LUMBAR LAMINECTOMY/DECOMPRESSION MICRODISCECTOMY Left 12/20/2018   Procedure: Left Lumbar Three-Four Lumbar Laminotomy and Foraminotomy, Lumbar Four-Five Laminotomy and Foraminotomy with Microdiscectomy and Resection of Synovial Cyst;  Surgeon: Jovita Gamma, MD;  Location: [facility];  Service: Neurosurgery;  Laterality: Left;  Left Lumbar 3-4 Lumbar laminotomy,  foraminotomy, possible microdiscectomy with possible resection of synovial cyst  . SPINE SURGERY    . VARICOSE VEIN SURGERY    . VESICOVAGINAL FISTULA CLOSURE W/ TAH       OB History    Gravida  3   Para  2   Term  2   Preterm  0   AB  1   Living  2     SAB  1   TAB      Ectopic      Multiple      Live Births              Family History  Problem Relation Age of Onset  . Cancer Father        Colon  . Coronary artery disease Other   . Heart attack Mother        d. 16  . Breast cancer Paternal Uncle 36  . Dementia Maternal Grandmother   . Leukemia Maternal Uncle 1    Social History   Tobacco Use  . Smoking status: Current Every Day Smoker    Packs/day: 0.25    Years: 10.00    Pack years: 2.50    Types: Cigarettes  . Smokeless tobacco: Never Used  . Tobacco comment: smokes 2 a day  Substance Use Topics  . Alcohol use: No  . Drug use: No    Comment: Hisrory of Cocaine use     Home Medications Prior to Admission medications   Medication Sig Start Date End Date Taking? Authorizing Provider  albuterol (VENTOLIN HFA) 108 (90 Base) MCG/ACT inhaler Inhale 1-2 puffs into the lungs every 6 (six) hours as needed for wheezing or shortness of breath.  10/22/19  Yes [provider]  amLODipine (NORVASC) 10 MG tablet Take 10 mg by mouth daily.  04/05/17  Yes [provider]  aspirin 81 MG chewable tablet Chew 81 mg by mouth daily.    Yes [provider]  atorvastatin (LIPITOR) 20 MG tablet Take 20 mg by mouth every morning.    Yes [provider]  cholecalciferol (VITAMIN D) 1000 UNITS tablet Take 1,000 Units by mouth daily.   Yes [provider]  cyclobenzaprine (FLEXERIL) 10 MG tablet Take 10 mg by mouth 3 (three) times daily as needed for muscle spasms.  10/11/18  Yes [provider]  furosemide (LASIX) 20 MG tablet Take 20 mg by mouth daily as needed for fluid.    Yes [provider]  gabapentin  (NEURONTIN) 600 MG tablet Take 600 mg by mouth 4 (four) times daily. 07/25/19  Yes [provider]  hydrocortisone (CORTEF) 10 MG tablet Take 10-20 mg by mouth See admin instructions. Takes 20 mg in the morning and 10 mg at night 04/07/16  Yes [provider]  levofloxacin (LEVAQUIN) 500 MG tablet Take 500 mg by mouth daily. 10 day course starting on 10/22/2019 10/22/19  Yes  [provider]  levothyroxine (SYNTHROID, LEVOTHROID) 75 MCG tablet Take 75 mcg by mouth daily before breakfast.   Yes [provider]  meclizine (ANTIVERT) 25 MG tablet 1 or 2 tabs PO q8h prn dizziness Patient taking differently: Take 25-50 mg by mouth 3 (three) times daily as needed (for vertigo).  04/16/16  Yes Francine Graven, DO  methylPREDNISolone (MEDROL DOSEPAK) 4 MG TBPK tablet Take 4-24 mg by mouth See admin instructions. Starting on 10/22/2019 take as directed per package instructions for 6 days (6,5,4,3,2,1) 10/22/19  Yes [provider]  neomycin-polymyxin b-dexamethasone (MAXITROL) 3.5-10000-0.1 SUSP Place 1 drop into the left eye in the morning, at noon, and at bedtime. 10/22/19  Yes [provider]  nitroGLYCERIN (NITROSTAT) 0.4 MG SL tablet Place 0.4 mg under the tongue every 5 (five) minutes as needed for chest pain.   Yes [provider]  ondansetron (ZOFRAN ODT) 4 MG disintegrating tablet Take 1 tablet (4 mg total) by mouth every 8 (eight) hours as needed for nausea. 08/07/19  Yes Emokpae, Courage, MD  oxyCODONE (ROXICODONE) 15 MG immediate release tablet Take 15 mg by mouth every 4 (four) hours as needed for pain.  07/27/19  Yes [provider]  polyethylene glycol (MIRALAX / GLYCOLAX) 17 g packet Take 17 g by mouth daily. 08/08/19  Yes Emokpae, Courage, MD  senna-docusate (SENOKOT-S) 8.6-50 MG tablet Take 2 tablets by mouth at bedtime. 08/07/19 08/06/20 Yes Emokpae, Courage, MD  zolpidem (AMBIEN) 10 MG tablet Take 10 mg by mouth at bedtime.  05/06/16  Yes [provider]    Allergies    Bee venom, Doxycycline, Erythromycin, Ibuprofen, Penicillins, Sulfa antibiotics, Cat hair extract, Dust mite extract, Tramadol, and Morphine and related  Review of Systems   Review of Systems  Constitutional: Positive for chills. Negative for activity change, appetite change, diaphoresis, fatigue, fever and unexpected weight change.  HENT: Negative.   Respiratory: Positive for cough. Negative for apnea, choking, chest tightness, shortness of breath, wheezing and stridor.   Cardiovascular: Negative.   Gastrointestinal: Positive for abdominal pain, nausea and vomiting. Negative for anal bleeding, blood in stool, constipation, diarrhea and rectal pain.  Genitourinary: Positive for flank pain. Negative for decreased urine volume, difficulty urinating, dysuria, frequency, hematuria, pelvic pain, urgency, vaginal bleeding, vaginal discharge and vaginal pain.  Skin: Negative.   Neurological: Negative.   All other systems reviewed and are negative.   Physical Exam Updated Vital Signs BP 139/69   Pulse 78   Temp 98.7 F (37.1 C) (Oral)   Resp 12   Ht 5\' 5"  (1.651 m)   Wt 77.1 kg   SpO2 90%   BMI 28.29 kg/m   Physical Exam Vitals and nursing note reviewed.  Constitutional:      General: She is not in acute distress.    Appearance: She is well-developed. She is not ill-appearing, toxic-appearing or diaphoretic.  HENT:     Head: Normocephalic and atraumatic.     Nose: Nose normal.     Mouth/Throat:     Mouth: Mucous membranes are moist.     Pharynx: Oropharynx is clear.  Eyes:     Pupils: Pupils are equal, round, and reactive to light.  Cardiovascular:     Rate and Rhythm: Normal rate.     Pulses: Normal pulses.     Heart sounds: Normal heart sounds.  Pulmonary:     Effort: Pulmonary effort is normal. No respiratory distress.     Breath sounds: Normal breath sounds.  Abdominal:  General: Bowel sounds are normal. There  is no distension.     Palpations: Abdomen is soft.     Tenderness: There is generalized abdominal tenderness. There is right CVA tenderness and left CVA tenderness. There is no guarding or rebound. Negative signs include Murphy's sign and McBurney's sign.     Hernia: No hernia is present.  Musculoskeletal:        General: Normal range of motion.     Cervical back: Normal and normal range of motion.     Thoracic back: Normal.     Lumbar back: Normal.       Back:     Comments: No midline spinal tenderness to palpation. Old midline lumbar scar from previous surgery without overlying skin changes.  Skin:    General: Skin is warm and dry.     Capillary Refill: Capillary refill takes less than 2 seconds.     Comments: Brisk cap refill  Neurological:     General: No focal deficit present.     Mental Status: She is alert.     Cranial Nerves: Cranial nerves are intact.     Sensory: Sensation is intact.     Motor: Motor function is intact.     Coordination: Coordination is intact.     Gait: Gait is intact.     ED Results / Procedures / Treatments   Labs (all labs ordered are listed, but only abnormal results are displayed) Labs Reviewed  URINALYSIS, ROUTINE W REFLEX MICROSCOPIC - Abnormal; Notable for the following components:      Result Value   Hgb urine dipstick SMALL (*)    Leukocytes,Ua SMALL (*)    Bacteria, UA RARE (*)    All other components within normal limits  CBC WITH DIFFERENTIAL/PLATELET - Abnormal; Notable for the following components:   WBC 16.3 (*)    Platelets 448 (*)    Neutro Abs 12.5 (*)    Monocytes Absolute 1.1 (*)    Abs Immature Granulocytes 0.10 (*)    All other components within normal limits  COMPREHENSIVE METABOLIC PANEL - Abnormal; Notable for the following components:   Sodium 134 (*)    Chloride 97 (*)    Glucose, Bld 108 (*)    BUN 31 (*)    Creatinine, Ser 1.32 (*)    GFR calc non Af Amer 45 (*)    GFR calc Af Amer 53 (*)    All other  components within normal limits  LIPASE, BLOOD    EKG EKG Interpretation  Date/Time:  Wednesday October 24 2019 20:15:08 EST Ventricular Rate:  78 PR Interval:    QRS Duration: 102 QT Interval:  404 QTC Calculation: 461 R Axis:   95 Text Interpretation: Sinus rhythm Borderline right axis deviation Probable left ventricular hypertrophy no significant change since Nov 2020 Confirmed by Sherwood Gambler [phone]) on 10/24/2019 8:58:40 PM   Radiology No results found.  Procedures Procedures (including critical care time)  Medications Ordered in ED Medications  iohexol (OMNIPAQUE) 300 MG/ML solution 100 mL (has no administration in time range)  sodium chloride 0.9 % bolus 1,000 mL (1,000 mLs Intravenous New Bag/Given 10/24/19 2003)  HYDROmorphone (DILAUDID) injection 0.5 mg (0.5 mg Intravenous Given 10/24/19 2003)  ondansetron (ZOFRAN) injection 4 mg (4 mg Intravenous Given 10/24/19 2055)  HYDROmorphone (DILAUDID) injection 1 mg (1 mg Intravenous Given 10/24/19 2129)  methocarbamol (ROBAXIN) tablet 500 mg (500 mg Oral Given 10/24/19 2129)    ED Course  I have reviewed the triage vital  signs and the nursing notes.  Pertinent labs & imaging results that were available during my care of the patient were reviewed by me and considered in my medical decision making (see chart for details).  54 year old female appears otherwise well presents for evaluation of abdominal pain.  She is afebrile, nonseptic, not ill-appearing.  Patient with diffuse tenderness to abdomen as well as bilateral flanks.  No midline spinal tenderness.  No red flags for back pain.  No recent injury or trauma.  Has had some chills and emesis.  No midline abdominal pain radiating to back.  Heart and lungs clear.  She is neurovascularly intact.  Normal musculoskeletal exam.  History of gastroparesis however states this does not feel like that.  Remote history of nephrolithiasis as a child however none since.  She is currently on  Levaquin for bronchitis.  She has not been tested for Covid. Labs, imaging, fluids reevaluate  Labs and imaging personally reviewed and interpreted: CBC leukocytosis at 16.3 Lipase 15 CMP hyponatremia at 134, creatinine 1.32, previous 1.0 UA small blood, rare leuks and bacteria EKG Qtc 461. No STEMI  Patient reassessed. Pain without improvement. Will order additional meds.  Care transferred to [place], PA-C who will follow up on CT imaging and reassess pain. She will determine ultimate treatment, plan and disposition.  Patient with #180 Norco 2 days ago for chronic. If patient dc home does NOT need additional narcotic pain meds.    MDM Rules/Calculators/A&P                      Final Clinical Impression(s) / ED Diagnoses Final diagnoses:  Flank pain  Generalized abdominal pain  Vomiting, intractability of vomiting not specified, presence of nausea not specified, unspecified vomiting type    Rx / DC Orders ED Discharge Orders    None       Breelle Hollywood A, PA-C 10/24/19 2144    Sherwood Gambler, MD 10/25/19 1521

## 2019-10-24 NOTE — ED Provider Notes (Signed)
Assumed patient care at shift change from [facility].  Patient with bilateral flank pain that radiates around to her abdomen and is worsened with movement, episodes of sharp spasms suggesting musculoskeletal source of symptoms.  Her CT was negative for any significant acute findings.  Labs including a leukocytosis which is a chronic finding, probably a side effect of chronic hydrocortisone use.  She was given a p.o. challenge which she tolerated.  CT results per below with no acute findings.  Will treat for musculoskeletal/muscle spasm.  She was given a Lidoderm patch with prescription along with Robaxin for muscle spasm relief.  Pt has norco.  Discussed heat therapy.  Plan follow-up with her PCP for recheck if symptoms are not improving with this treatment plan.   CT Abdomen Pelvis W Contrast  Result Date: 10/24/2019 CLINICAL DATA:  54 year old female with abdominal pain. Bilateral flank pain and dysuria. EXAM: CT ABDOMEN AND PELVIS WITH CONTRAST TECHNIQUE: Multidetector CT imaging of the abdomen and pelvis was performed using the standard protocol following bolus administration of intravenous contrast. CONTRAST:  1105mL OMNIPAQUE IOHEXOL 300 MG/ML  SOLN COMPARISON:  CT abdomen pelvis dated 10/19/2018. FINDINGS: Lower chest: Minimal left lung base dependent atelectasis. The visualized lung bases are otherwise clear. No intra-abdominal free air or free fluid. Hepatobiliary: The liver is unremarkable. There is mild intrahepatic biliary ductal dilatation. The common bile duct measures up to 15 mm in diameter. Status post prior cholecystectomy. No retained calcified stone noted in the central CBD. Pancreas: Unremarkable. No pancreatic ductal dilatation or surrounding inflammatory changes. Spleen: Normal in size without focal abnormality. Adrenals/Urinary Tract: The adrenal glands are unremarkable. There is no hydronephrosis on either side. There is symmetric enhancement and excretion of contrast by both kidneys.  The visualized ureters and urinary bladder appear unremarkable. Stomach/Bowel: There is moderate stool throughout the colon. There is no bowel obstruction or active inflammation. The appendix is normal. Vascular/Lymphatic: Mild aortoiliac atherosclerotic disease. The IVC is unremarkable. No portal venous gas. There is no adenopathy. Reproductive: Hysterectomy.  No adnexal masses. Other: Small fat containing umbilical hernia. Musculoskeletal: Multilevel facet arthropathy. No acute osseous pathology. IMPRESSION: 1. No acute intra-abdominal or pelvic pathology.  No hydronephrosis. 2. Moderate colonic stool burden. No bowel obstruction or active inflammation. Normal appendix. Electronically Signed   By: Anner Crete M.D.   On: 10/24/2019 22:39      Evalee Jefferson, Hershal Coria 10/25/19 [id number]    Sherwood Gambler, MD 10/25/19 [id number]

## 2019-10-24 NOTE — ED Triage Notes (Signed)
Per EMS patient from home. Patient complains of bilateral flank pain since last night. Patient states minor pain x 1 week. States dysuria that started last night.

## 2019-10-30 ENCOUNTER — Telehealth: Payer: Self-pay | Admitting: Genetic Counselor

## 2019-10-30 NOTE — Telephone Encounter (Signed)
Called to check on the status of completing the saliva kit.  Her mailbox is full and I could not leave a message.

## 2019-11-02 ENCOUNTER — Ambulatory Visit (HOSPITAL_COMMUNITY)
Admission: RE | Admit: 2019-11-02 | Discharge: 2019-11-02 | Disposition: A | Discharge status: Home / Self Care | Payer: Medicare HMO | Source: Ambulatory Visit | Attending: Family Medicine | Admitting: Family Medicine

## 2019-11-02 ENCOUNTER — Other Ambulatory Visit: Payer: Self-pay

## 2019-11-02 ENCOUNTER — Other Ambulatory Visit (HOSPITAL_COMMUNITY): Payer: Self-pay | Admitting: Family Medicine

## 2019-11-02 ENCOUNTER — Other Ambulatory Visit: Payer: Self-pay | Admitting: Family Medicine

## 2019-11-02 DIAGNOSIS — Z1389 Encounter for screening for other disorder: Secondary | ICD-10-CM | POA: Diagnosis not present

## 2019-11-02 DIAGNOSIS — M7989 Other specified soft tissue disorders: Secondary | ICD-10-CM | POA: Insufficient documentation

## 2019-11-02 DIAGNOSIS — E663 Overweight: Secondary | ICD-10-CM | POA: Diagnosis not present

## 2019-11-02 DIAGNOSIS — Z6829 Body mass index (BMI) 29.0-29.9, adult: Secondary | ICD-10-CM | POA: Diagnosis not present

## 2019-11-02 DIAGNOSIS — M79661 Pain in right lower leg: Secondary | ICD-10-CM | POA: Diagnosis not present

## 2019-11-02 DIAGNOSIS — M25561 Pain in right knee: Secondary | ICD-10-CM | POA: Diagnosis not present

## 2019-11-04 DIAGNOSIS — E271 Primary adrenocortical insufficiency: Secondary | ICD-10-CM | POA: Diagnosis not present

## 2019-11-04 DIAGNOSIS — M48061 Spinal stenosis, lumbar region without neurogenic claudication: Secondary | ICD-10-CM | POA: Diagnosis not present

## 2019-11-04 DIAGNOSIS — E063 Autoimmune thyroiditis: Secondary | ICD-10-CM | POA: Diagnosis not present

## 2019-11-04 DIAGNOSIS — I2510 Atherosclerotic heart disease of native coronary artery without angina pectoris: Secondary | ICD-10-CM | POA: Diagnosis not present

## 2019-11-14 DIAGNOSIS — I10 Essential (primary) hypertension: Secondary | ICD-10-CM | POA: Diagnosis not present

## 2019-11-14 DIAGNOSIS — Z6828 Body mass index (BMI) 28.0-28.9, adult: Secondary | ICD-10-CM | POA: Diagnosis not present

## 2019-11-14 DIAGNOSIS — G894 Chronic pain syndrome: Secondary | ICD-10-CM | POA: Diagnosis not present

## 2019-11-14 DIAGNOSIS — K219 Gastro-esophageal reflux disease without esophagitis: Secondary | ICD-10-CM | POA: Diagnosis not present

## 2019-11-14 DIAGNOSIS — Z1389 Encounter for screening for other disorder: Secondary | ICD-10-CM | POA: Diagnosis not present

## 2019-11-14 DIAGNOSIS — M064 Inflammatory polyarthropathy: Secondary | ICD-10-CM | POA: Diagnosis not present

## 2019-11-14 DIAGNOSIS — Z0001 Encounter for general adult medical examination with abnormal findings: Secondary | ICD-10-CM | POA: Diagnosis not present

## 2019-11-19 ENCOUNTER — Telehealth: Payer: Self-pay | Admitting: Genetic Counselor

## 2019-11-19 NOTE — Telephone Encounter (Signed)
LM on VM that I was calling about the saliva kit sent from the lab and whether she ever received it.  Asked that she CB.

## 2019-11-20 NOTE — Telephone Encounter (Signed)
LM on VM that I was reaching out to her about her testing and to please call me back.  Left CB instructions.

## 2019-11-23 ENCOUNTER — Telehealth: Payer: Self-pay | Admitting: Genetic Counselor

## 2019-11-23 DIAGNOSIS — Z1389 Encounter for screening for other disorder: Secondary | ICD-10-CM | POA: Diagnosis not present

## 2019-11-23 DIAGNOSIS — E663 Overweight: Secondary | ICD-10-CM | POA: Diagnosis not present

## 2019-11-23 DIAGNOSIS — Z6828 Body mass index (BMI) 28.0-28.9, adult: Secondary | ICD-10-CM | POA: Diagnosis not present

## 2019-11-23 DIAGNOSIS — Z0001 Encounter for general adult medical examination with abnormal findings: Secondary | ICD-10-CM | POA: Diagnosis not present

## 2019-11-23 NOTE — Telephone Encounter (Signed)
The second saliva kit failed.  We need to set up a blood draw. Will do through [facility]'s mobile phlebotomy program.

## 2019-12-04 DIAGNOSIS — Z803 Family history of malignant neoplasm of breast: Secondary | ICD-10-CM | POA: Diagnosis not present

## 2019-12-04 DIAGNOSIS — Z800 Family history of malignant neoplasm of digestive organs: Secondary | ICD-10-CM | POA: Diagnosis not present

## 2019-12-04 DIAGNOSIS — Z86010 Personal history of colonic polyps: Secondary | ICD-10-CM | POA: Diagnosis not present

## 2019-12-05 DIAGNOSIS — E271 Primary adrenocortical insufficiency: Secondary | ICD-10-CM | POA: Diagnosis not present

## 2019-12-05 DIAGNOSIS — I2510 Atherosclerotic heart disease of native coronary artery without angina pectoris: Secondary | ICD-10-CM | POA: Diagnosis not present

## 2019-12-05 DIAGNOSIS — M48061 Spinal stenosis, lumbar region without neurogenic claudication: Secondary | ICD-10-CM | POA: Diagnosis not present

## 2019-12-05 DIAGNOSIS — E039 Hypothyroidism, unspecified: Secondary | ICD-10-CM | POA: Diagnosis not present

## 2019-12-06 DIAGNOSIS — D72829 Elevated white blood cell count, unspecified: Secondary | ICD-10-CM | POA: Diagnosis not present

## 2019-12-11 ENCOUNTER — Telehealth: Payer: Medicare HMO | Admitting: Physician Assistant

## 2019-12-17 ENCOUNTER — Telehealth (INDEPENDENT_AMBULATORY_CARE_PROVIDER_SITE_OTHER): Payer: Medicare HMO | Admitting: Physician Assistant

## 2019-12-17 ENCOUNTER — Telehealth: Payer: Self-pay

## 2019-12-17 ENCOUNTER — Encounter: Payer: Self-pay | Admitting: Physician Assistant

## 2019-12-17 ENCOUNTER — Telehealth: Payer: Self-pay | Admitting: Physician Assistant

## 2019-12-17 VITALS — BP 158/114 | HR 89 | Ht 65.5 in | Wt 174.0 lb

## 2019-12-17 DIAGNOSIS — E039 Hypothyroidism, unspecified: Secondary | ICD-10-CM

## 2019-12-17 DIAGNOSIS — I25111 Atherosclerotic heart disease of native coronary artery with angina pectoris with documented spasm: Secondary | ICD-10-CM

## 2019-12-17 DIAGNOSIS — Z720 Tobacco use: Secondary | ICD-10-CM | POA: Diagnosis not present

## 2019-12-17 DIAGNOSIS — E785 Hyperlipidemia, unspecified: Secondary | ICD-10-CM | POA: Diagnosis not present

## 2019-12-17 DIAGNOSIS — I10 Essential (primary) hypertension: Secondary | ICD-10-CM

## 2019-12-17 MED ORDER — NITROGLYCERIN 0.4 MG SL SUBL: 0.4000 mg | SUBLINGUAL_TABLET | SUBLINGUAL | 2 refills | Status: DC | PRN

## 2019-12-17 MED ORDER — METOPROLOL TARTRATE 25 MG PO TABS: 12.5000 mg | ORAL_TABLET | Freq: Two times a day (BID) | ORAL | 3 refills | Status: DC

## 2019-12-17 NOTE — Patient Instructions (Addendum)
Medication Instructions:   START METOPROLOL TARTRATE 12.5 MG 2 TIMES A DAY  *If you need a refill on your cardiac medications before your next appointment, please call your pharmacy*  Lab Work: NONE ordered at this time of appointment   If you have labs (blood work) drawn today and your tests are completely normal, you will receive your results only by: Marland Kitchen MyChart Message (if you have MyChart) OR . A paper copy in the mail If you have any lab test that is abnormal or we need to change your treatment, we will call you to review the results.  Testing/Procedures: NONE ordered at this time of appointment   Follow-Up: At [facility], you and your health needs are our priority.  As part of our continuing mission to provide you with exceptional heart care, we have created designated Provider Care Teams.  These Care Teams include your primary Cardiologist (physician) and Advanced Practice Providers (APPs -  Physician Assistants and Nurse Practitioners) who all work together to provide you with the care you need, when you need it.  Your next appointment:   3 day(s)  The format for your next appointment:   In Person  Provider:   [name], PA-C  Other Instructions

## 2019-12-17 NOTE — Telephone Encounter (Signed)
  Patient Consent for Virtual Visit         [name] has provided verbal consent on 12/17/2019 for a virtual visit (video or telephone).   CONSENT FOR VIRTUAL VISIT FOR:  [name]  By participating in this virtual visit I agree to the following:  I hereby voluntarily request, consent and authorize [place] and its employed or contracted physicians, physician assistants, nurse practitioners or other licensed health care professionals (the Practitioner), to provide me with telemedicine health care services (the "Services") as deemed necessary by the treating Practitioner. I acknowledge and consent to receive the Services by the Practitioner via telemedicine. I understand that the telemedicine visit will involve communicating with the Practitioner through live audiovisual communication technology and the disclosure of certain medical information by electronic transmission. I acknowledge that I have been given the opportunity to request an in-person assessment or other available alternative prior to the telemedicine visit and am voluntarily participating in the telemedicine visit.  I understand that I have the right to withhold or withdraw my consent to the use of telemedicine in the course of my care at any time, without affecting my right to future care or treatment, and that the Practitioner or I may terminate the telemedicine visit at any time. I understand that I have the right to inspect all information obtained and/or recorded in the course of the telemedicine visit and may receive copies of available information for a reasonable fee.  I understand that some of the potential risks of receiving the Services via telemedicine include:  Marland Kitchen Delay or interruption in medical evaluation due to technological equipment failure or disruption; . Information transmitted may not be sufficient (e.g. poor resolution of images) to allow for appropriate medical decision making by the Practitioner;  and/or  . In rare instances, security protocols could fail, causing a breach of personal health information.  Furthermore, I acknowledge that it is my responsibility to provide information about my medical history, conditions and care that is complete and accurate to the best of my ability. I acknowledge that Practitioner's advice, recommendations, and/or decision may be based on factors not within their control, such as incomplete or inaccurate data provided by me or distortions of diagnostic images or specimens that may result from electronic transmissions. I understand that the practice of medicine is not an exact science and that Practitioner makes no warranties or guarantees regarding treatment outcomes. I acknowledge that a copy of this consent can be made available to me via my patient portal ([place]), or I can request a printed copy by calling the office of [facility].    I understand that my insurance will be billed for this visit.   I have read or had this consent read to me. . I understand the contents of this consent, which adequately explains the benefits and risks of the Services being provided via telemedicine.  . I have been provided ample opportunity to ask questions regarding this consent and the Services and have had my questions answered to my satisfaction. . I give my informed consent for the services to be provided through the use of telemedicine in my medical care

## 2019-12-17 NOTE — Progress Notes (Signed)
Virtual Visit via Telephone Note   This visit type was conducted due to national recommendations for restrictions regarding the COVID-19 Pandemic (e.g. social distancing) in an effort to limit this patient's exposure and mitigate transmission in our community.  Due to her co-morbid illnesses, this patient is at least at moderate risk for complications without adequate follow up.  This format is felt to be most appropriate for this patient at this time.  The patient did not have access to video technology/had technical difficulties with video requiring transitioning to audio format only (telephone).  All issues noted in this document were discussed and addressed.  No physical exam could be performed with this format.  Please refer to the patient's chart for her  consent to telehealth for [facility].   The patient was identified using 2 identifiers.  Date:  12/19/2019   ID:  [name], DOB [birth year], MRN [id number]  Patient Location: Home Provider Location: Home  PCP:  Redmond School, MD  Cardiologist:  Pixie Casino, MD  Electrophysiologist:  None   Evaluation Performed:  Follow-Up Visit  Chief Complaint:  followup  History of Present Illness:    [name] is a 54 y.o. female with HTN, HLD, and tobacco abuse.In 2013, she was evaluated for chest pain with left heart cath which showed preserved LV function and no significant obstructive coronary artery disease. Previously has been reported there was a 50-60% stenosis in mid RCA which was noted to be completely resolved in 2013 favoring diagnosis of coronary spasm. She has a history of Addison's disease, hypothyroidism, prolonged QT interval and remote history of nonischemic cardiomyopathy with EF 40-45% which later normalized.  Last echocardiogram obtained on 05/26/2015 showed EF 55 to 60%, grade 1 DD. I last saw the patient in April 2020 for preoperative clearance prior to her lumbar laminectomy and resection of synovial cyst  by Dr. Sherwood Gambler.  She was admitted in December 2020 with pleuritic chest pain, headache, dizziness and a fever with T-max 102.9.  She was exposed to relatives who have tested positive for COVID-19, although her own COVID-19 test was negative.  She also had intractable emesis and AKI that resolved with antiemetic and hydration.  She was seen in the ED again February 2021 with bilateral flank pain that worse with movement.  CT scan was negative for acute finding.  She was treated for muscle spasm.  EKG at the time showed normal sinus rhythm without significant ST-T wave changes.  Patient presents today for telephone visit.  She says 5 days ago, she had to run to her neighbor's house after her neighbor called EMS.  Since then, she feels her heart was racing and had some chest discomfort.  The chest pain comes and goes.  He has not taken any sublingual nitroglycerin since her nitroglycerin has expired.  I will refill her sublingual nitroglycerin.  She is aware that if her chest pain persist after the third nitroglycerin she need to go to the hospital.  Given her history of coronary spasm, I also recommend the addition of metoprolol tartrate 12.5 mg twice daily to her medical regimen.  At some point, she likely will need a sleep study as well.  I will see her back this Thursday to see if her symptom is better.  If not, I will proceed with Myoview.  The patient does not have symptoms concerning for COVID-19 infection (fever, chills, cough, or new shortness of breath).    Past Medical History:  Diagnosis Date  .  Addison's disease ([place])   . Adrenal insufficiency ([place])    diagnosed 2012  . Aneurysm ([place])   . Anxiety   . Arthritis   . Astigmatism   . CAD (coronary artery disease)    Cath 2008 EF normal. RCA 50-60, Septal 50%. Myoview 3/12: EF 53% normal perfusion  . Cardiac arrest ([place])    2/2 adissonian crisis  . Cardiomyopathy    resolved  . Chest pain    chronicc  . CHF (congestive heart failure)  ([place])   . Chronic back pain   . Chronic diarrhea   . Concussion    sept 28th 2014  . Family history of breast cancer   . Family history of colon cancer   . Gastroparesis   . GERD (gastroesophageal reflux disease)   . HTN (hypertension)   . Hyperlipidemia   . Hypothyroidism   . Mitral valve prolapse   . Nondiabetic gastroparesis   . PONV (postoperative nausea and vomiting)   . QT prolongation   . Tobacco abuse    down to 2 cigarettes per day  . Vertigo    Past Surgical History:  Procedure Laterality Date  . ABDOMINAL HYSTERECTOMY    . CARDIAC CATHETERIZATION  03/2007   showed 60% lesion in the right coronary artery  . CHOLECYSTECTOMY    . LEFT HEART CATHETERIZATION WITH CORONARY ANGIOGRAM N/A 04/13/2012   Procedure: LEFT HEART CATHETERIZATION WITH CORONARY ANGIOGRAM;  Surgeon: Hillary Bow, MD;  Location: [facility] CATH LAB;  Service: Cardiovascular;  Laterality: N/A;  . LUMBAR LAMINECTOMY/DECOMPRESSION MICRODISCECTOMY Left 12/20/2018   Procedure: Left Lumbar Three-Four Lumbar Laminotomy and Foraminotomy, Lumbar Four-Five Laminotomy and Foraminotomy with Microdiscectomy and Resection of Synovial Cyst;  Surgeon: Jovita Gamma, MD;  Location: [facility];  Service: Neurosurgery;  Laterality: Left;  Left Lumbar 3-4 Lumbar laminotomy, foraminotomy, possible microdiscectomy with possible resection of synovial cyst  . SPINE SURGERY    . VARICOSE VEIN SURGERY    . VESICOVAGINAL FISTULA CLOSURE W/ TAH       Current Meds  Medication Sig  . amLODipine (NORVASC) 10 MG tablet Take 10 mg by mouth daily.   Marland Kitchen aspirin 81 MG chewable tablet Chew 81 mg by mouth daily.   Marland Kitchen atorvastatin (LIPITOR) 20 MG tablet Take 20 mg by mouth every morning.   . cholecalciferol (VITAMIN D) 1000 UNITS tablet Take 1,000 Units by mouth daily.  . cyclobenzaprine (FLEXERIL) 10 MG tablet Take 10 mg by mouth 3 (three) times daily as needed for muscle spasms.   . furosemide (LASIX) 20 MG tablet Take 20 mg by mouth daily as  needed for fluid.   Marland Kitchen gabapentin (NEURONTIN) 600 MG tablet Take 600 mg by mouth 4 (four) times daily.  . hydrochlorothiazide (HYDRODIURIL) 12.5 MG tablet Take by mouth.  . hydrocortisone (CORTEF) 10 MG tablet Take 10-20 mg by mouth See admin instructions. Takes 20 mg in the morning and 10 mg at night  . levothyroxine (SYNTHROID, LEVOTHROID) 75 MCG tablet Take 75 mcg by mouth daily before breakfast.  . meclizine (ANTIVERT) 25 MG tablet 1 or 2 tabs PO q8h prn dizziness (Patient taking differently: Take 25-50 mg by mouth 3 (three) times daily as needed (for vertigo). )  . neomycin-polymyxin b-dexamethasone (MAXITROL) 3.5-10000-0.1 SUSP Place 1 drop into the left eye in the morning, at noon, and at bedtime.  . ondansetron (ZOFRAN ODT) 4 MG disintegrating tablet Take 1 tablet (4 mg total) by mouth every 8 (eight) hours as needed for nausea.  Marland Kitchen oxyCODONE (  ROXICODONE) 15 MG immediate release tablet Take 15 mg by mouth every 4 (four) hours as needed for pain.   . polyethylene glycol (MIRALAX / GLYCOLAX) 17 g packet Take 17 g by mouth daily. (Patient taking differently: Take 17 g by mouth as needed. )  . senna-docusate (SENOKOT-S) 8.6-50 MG tablet Take 2 tablets by mouth at bedtime. (Patient taking differently: Take 1 tablet by mouth at bedtime as needed. )  . zolpidem (AMBIEN) 10 MG tablet Take 10 mg by mouth at bedtime.     Allergies:   Bee venom, Doxycycline, Erythromycin, Ibuprofen, Penicillins, Sulfa antibiotics, Cat hair extract, Dust mite extract, Tramadol, and Morphine and related   Social History   Tobacco Use  . Smoking status: Current Every Day Smoker    Packs/day: 0.25    Years: 10.00    Pack years: 2.50    Types: Cigarettes  . Smokeless tobacco: Never Used  . Tobacco comment: smokes 2 a day  Substance Use Topics  . Alcohol use: No  . Drug use: No    Comment: Hisrory of Cocaine use      Family Hx: The patient's family history includes Breast cancer (age of onset: 80) in her  paternal uncle; Cancer in her father; Coronary artery disease in an other family member; Dementia in her maternal grandmother; Heart attack in her mother; Leukemia (age of onset: 66) in her maternal uncle.  ROS:   Please see the history of present illness.     All other systems reviewed and are negative.   Prior CV studies:   The following studies were reviewed today:  Cath 04/13/2012 AO 124/75 (96) LV 129/16 No gradient on pullback              Coronary angiography: Coronary dominance: right  Left mainstem: Large caliber vessel.  No obstruction.   Left anterior descending (LAD): Courses to the apex and provides a major diagonal.  No obstruction.   Left circumflex (LCx): Provides large intermediate and large posterior vessel.  No obstruction noted.    Right coronary artery (RCA): Large caliber vessel with large PDA and PLA.  No obstruction.  The PDA is smaller in caliber than the PLA.  Of note, in the prior area of narrowing in the mid RCA, there is no obstruction.  The findings favor spasm on the prior study  Left ventriculography: Left ventricular systolic function is normal, LVEF is estimated at 55-65%, there is no significant mitral regurgitation   Final Conclusions:   1.  Preserved LV function. 2.  No significant obstruction    Echo 05/26/2015 LV EF: 55% -  60%   Study Conclusions   - Left ventricle: The cavity size was normal. Wall thickness was  normal. Systolic function was normal. The estimated ejection  fraction was in the range of 55% to 60%. Wall motion was normal;  there were no regional wall motion abnormalities. Doppler  parameters are consistent with abnormal left ventricular  relaxation (grade 1 diastolic dysfunction).  - Atrial septum: There was redundancy of the septum, with  borderline criteria for aneurysm.   Impressions:   - Normal LV systolic function; grade 1 diastolic dysfunction;  trace MR.   Labs/Other Tests and Data  Reviewed:    EKG:  An ECG dated 10/24/2019 was personally reviewed today and demonstrated:  Normal sinus rhythm without significant ST-T wave changes  Recent Labs: 08/05/2019: B Natriuretic Peptide 59.0; Magnesium 2.2 10/24/2019: ALT 12; BUN 31; Creatinine, Ser 1.32; Hemoglobin 13.7; Platelets 448;  Potassium 4.0; Sodium 134   Recent Lipid Panel Lab Results  Component Value Date/Time   CHOL 183 01/01/2009 01:03 PM   TRIG 112.0 01/01/2009 01:03 PM   HDL 31.20 (L) 01/01/2009 01:03 PM   CHOLHDL 6 01/01/2009 01:03 PM   LDLCALC 129 (H) 01/01/2009 01:03 PM    Wt Readings from Last 3 Encounters:  12/17/19 174 lb (78.9 kg)  10/24/19 170 lb (77.1 kg)  08/05/19 170 lb (77.1 kg)     Objective:    Vital Signs:  BP (!) 158/114   Pulse 89   Ht 5' 5.5" (1.664 m)   Wt 174 lb (78.9 kg)   BMI 28.51 kg/m     VITAL SIGNS:  reviewed  ASSESSMENT & PLAN:    1. Coronary spasm: Patient had intermittent chest discomfort and pounding sensation in the chest for the past 5 days after she overexerted herself running to her neighbor's house who called EMS.  Symptom is somewhat atypical.  Previous cardiac catheterization in 2013 showed normal coronary arteries.  Cardiac catheterization at the time demonstrated possible coronary spasm.  I recommended addition of low-dose beta-blocker.  2. Hypertension: Blood pressure elevated, add metoprolol tartrate 12.5 mg twice daily  3. Hyperlipidemia: On Lipitor  4. Hypothyroidism: On levothyroxine  5. Tobacco abuse: Tobacco cessation imperative  COVID-19 Education: The signs and symptoms of COVID-19 were discussed with the patient and how to seek care for testing (follow up with PCP or arrange E-visit).  The importance of social distancing was discussed today.  Time:   Today, I have spent 14 minutes with the patient with telehealth technology discussing the above problems.     Medication Adjustments/Labs and Tests Ordered: Current medicines are reviewed at  length with the patient today.  Concerns regarding medicines are outlined above.   Tests Ordered: No orders of the defined types were placed in this encounter.   Medication Changes: Meds ordered this encounter  Medications  . metoprolol tartrate (LOPRESSOR) 25 MG tablet    Sig: Take 0.5 tablets (12.5 mg total) by mouth 2 (two) times daily.    Dispense:  180 tablet    Refill:  3  . nitroGLYCERIN (NITROSTAT) 0.4 MG SL tablet    Sig: Place 1 tablet (0.4 mg total) under the tongue every 5 (five) minutes as needed for chest pain.    Dispense:  25 tablet    Refill:  2    Follow Up:  Either In Person or Virtual in 4 day(s)  Signed, [name], [place]  12/19/2019 11:07 PM    [place]

## 2019-12-17 NOTE — Telephone Encounter (Signed)
New Message    Pt c/o medication issue:  1. Name of Medication: Metoprolol Tartrate and Nitroglycerin   2. How are you currently taking this medication (dosage and times per day)? Not yet taking   3. Are you having a reaction (difficulty breathing--STAT)? No   4. What is your medication issue? Pt is calling and says these medications were suppose to be sent to her pharmacy today and the pharmacy has not received the request  She would like a call back    Please call

## 2019-12-18 ENCOUNTER — Telehealth: Payer: Self-pay | Admitting: Physician Assistant

## 2019-12-18 DIAGNOSIS — G894 Chronic pain syndrome: Secondary | ICD-10-CM | POA: Diagnosis not present

## 2019-12-18 NOTE — Telephone Encounter (Signed)
Returned call to patient on 12/17/19. I informed the patient and informed her that the refills for Nitro and Metoprolol Tartrate were sent to the patient's pharmacy today 12/17/19. Patient informed me that her pharmacy let her know that she was calling before the Rx was electronically received.

## 2019-12-18 NOTE — Telephone Encounter (Signed)
New message   Patient states that she is returning Terrah's call. Please call.

## 2019-12-18 NOTE — Telephone Encounter (Signed)
Returned call to patient to go over her Telehealth AVS from 12/17/2019, her upcoming appointment with [name], PA-C. Patient thanked me for returning her call and she verbalized an understanding. All (if any) questions were answered.

## 2019-12-19 IMAGING — CR ABDOMEN - 2 VIEW
2 series · 2 of 2 positions shown · non-contrast
Comparison: CT abdomen and pelvis 10/19/2018. Abdominal radiograph
02/16/2015.

CLINICAL DATA: Epigastric pain, nausea, and diarrhea for several
days.

EXAM:
ABDOMEN - 2 VIEW

[t abdomen supine]
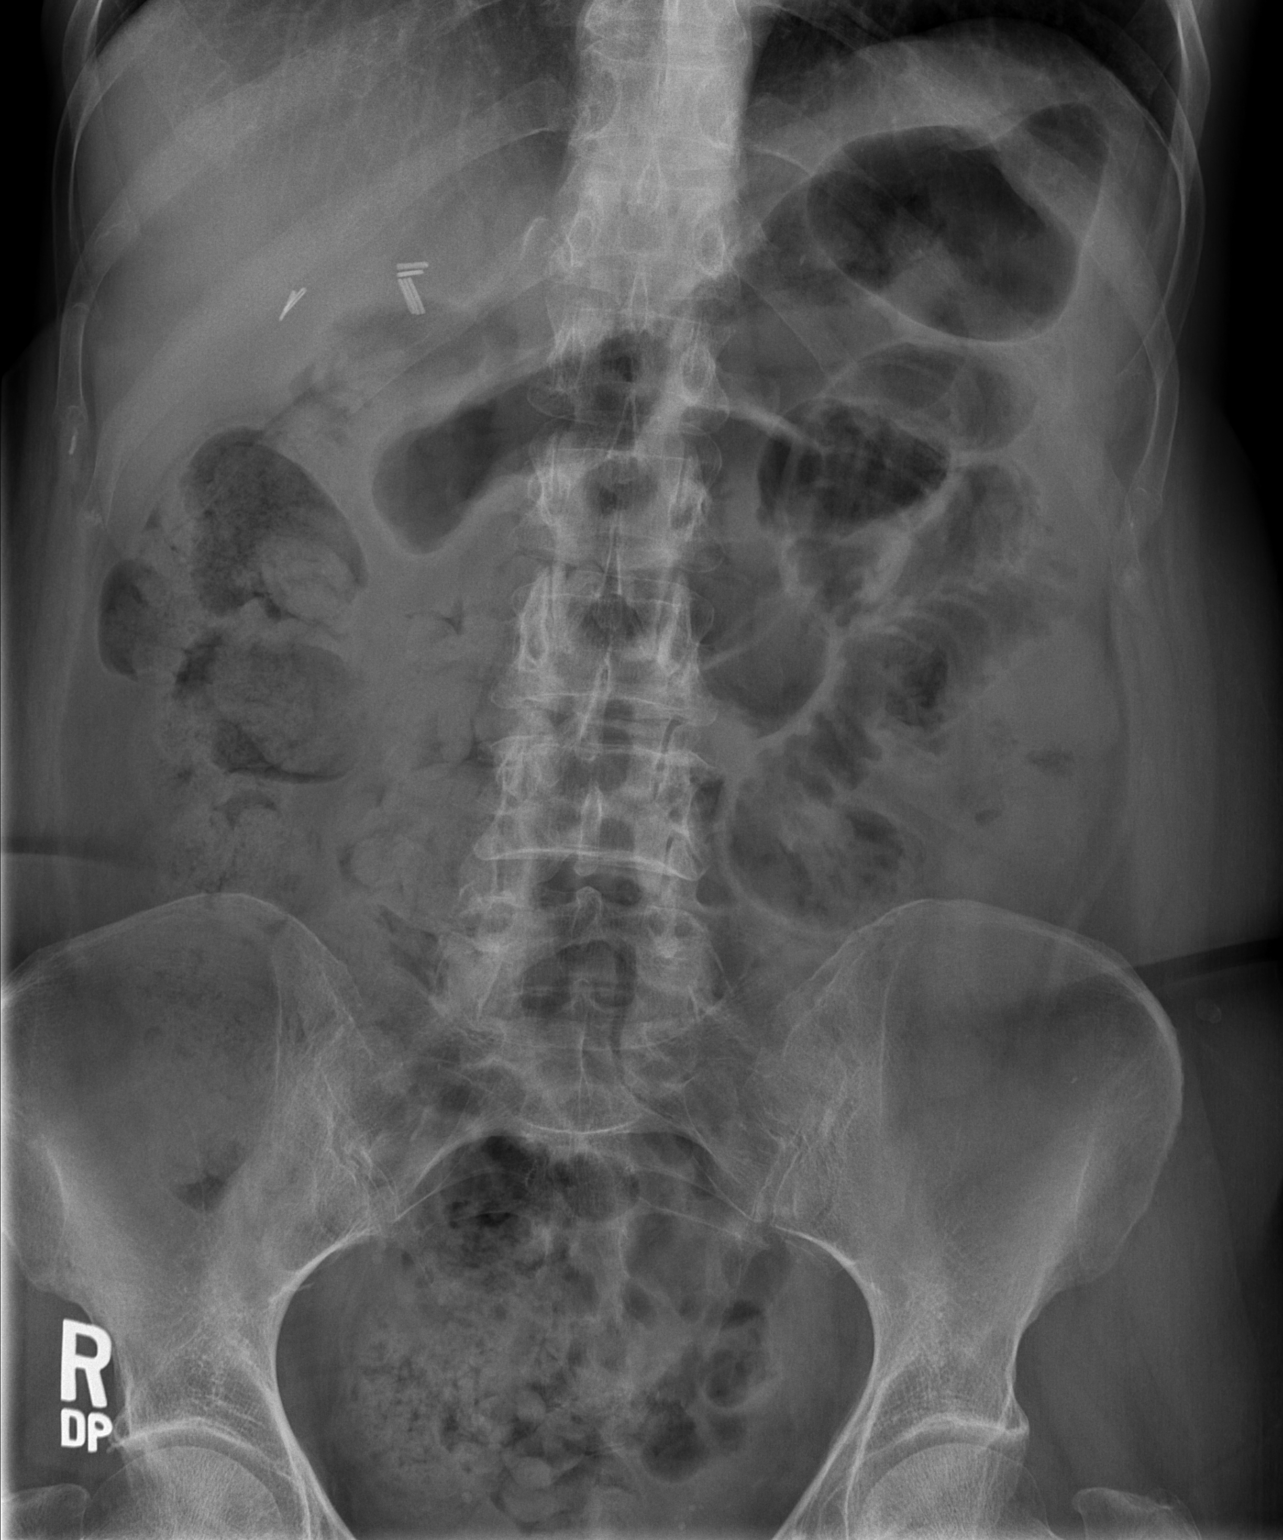

[w abdomen upright *]
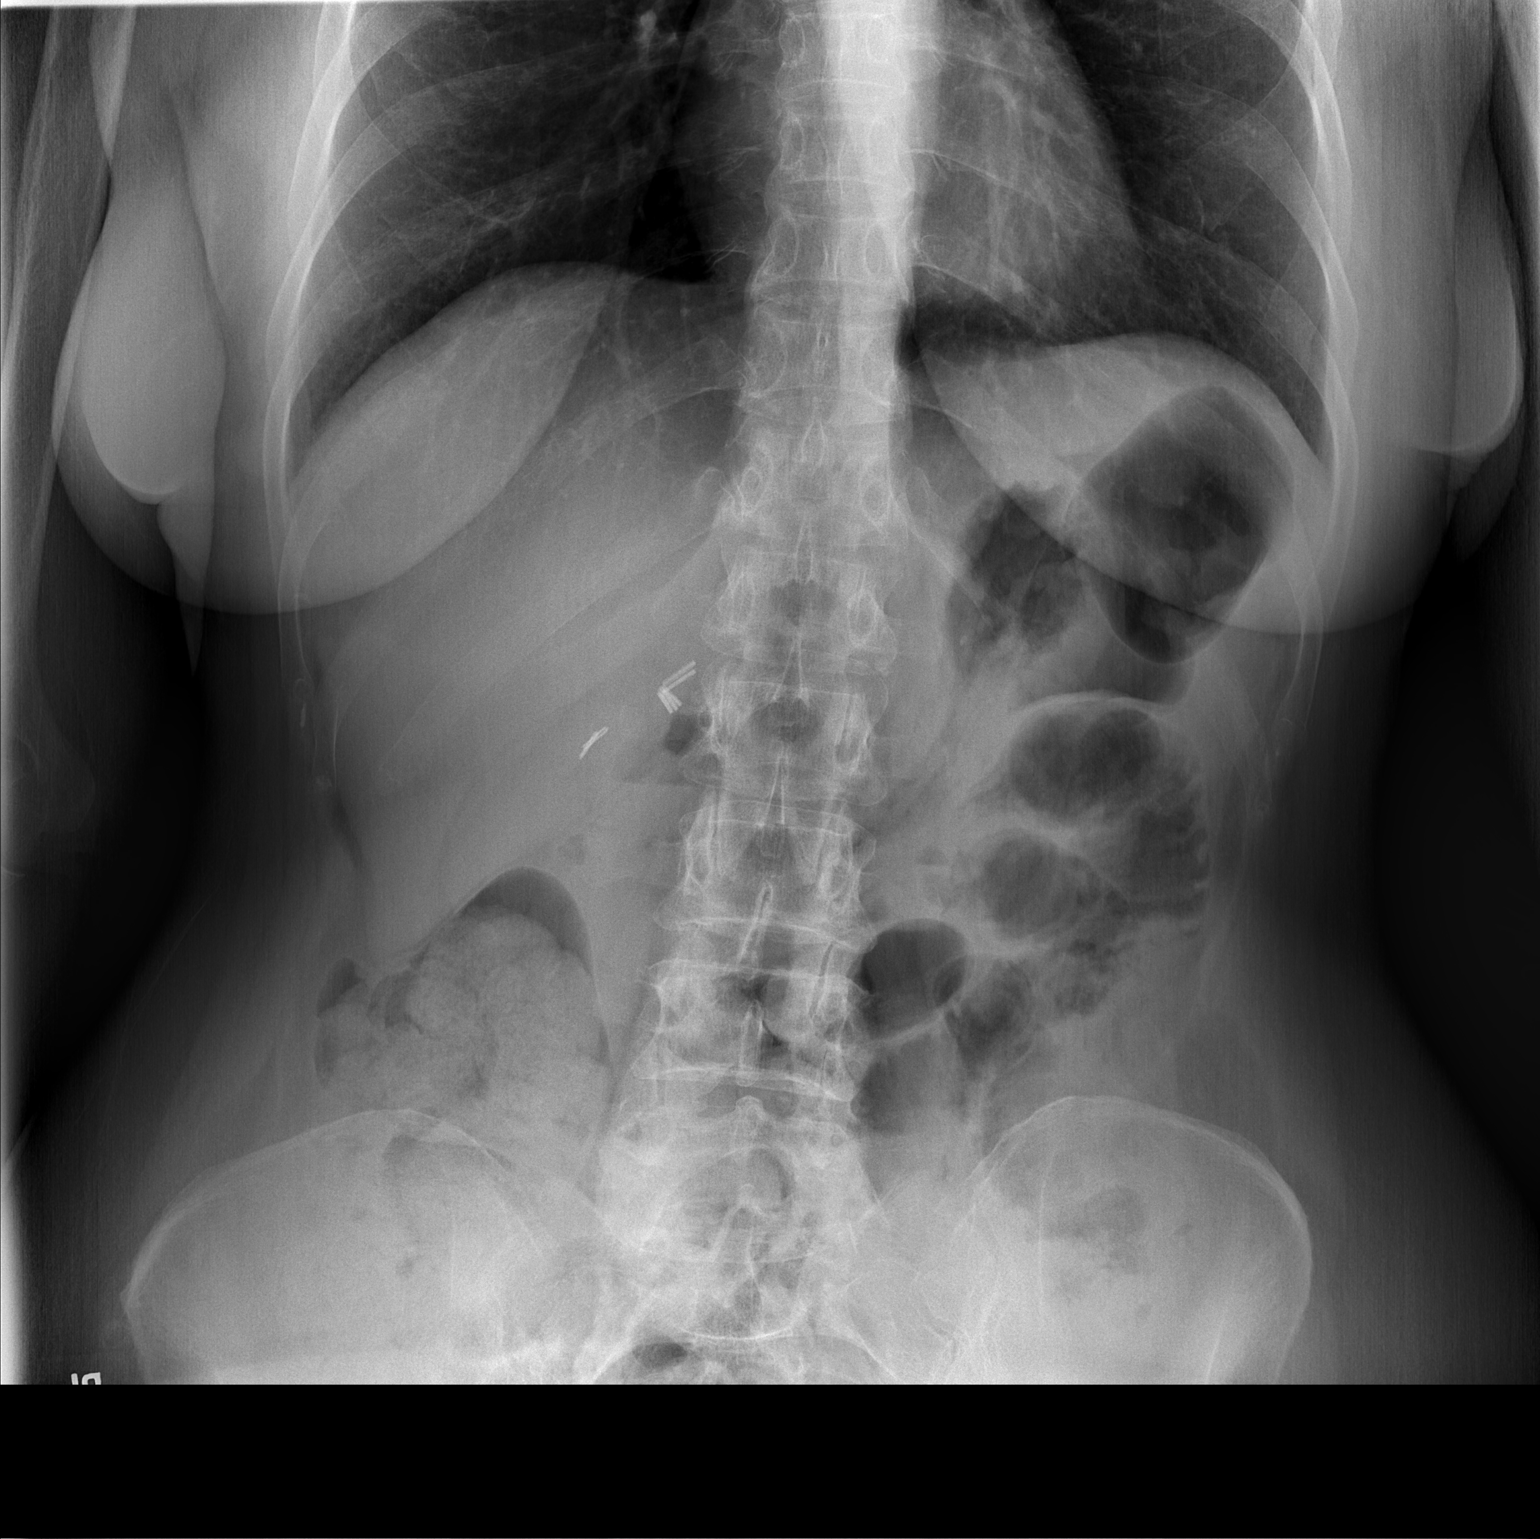

[2 of 2 positions shown; findings below may reference images not displayed]

FINDINGS: A moderate amount of stool is present in the colon and rectum. Gas
is present in mildly prominent small bowel loops in the left mid
abdomen which are borderline dilated but similar to the 2953 study,
and there is no other evidence of bowel obstruction. Surgical clips
are present in the right upper quadrant. The visualized lung bases
are clear. No acute osseous abnormality is seen.
IMPRESSION: Moderate colonic stool burden.

## 2019-12-20 ENCOUNTER — Ambulatory Visit: Payer: Medicare HMO | Admitting: Physician Assistant

## 2019-12-20 ENCOUNTER — Other Ambulatory Visit: Payer: Self-pay

## 2019-12-20 VITALS — BP 118/84 | HR 68 | Temp 97.4°F | Ht 65.5 in | Wt 177.0 lb

## 2019-12-20 DIAGNOSIS — E039 Hypothyroidism, unspecified: Secondary | ICD-10-CM | POA: Diagnosis not present

## 2019-12-20 DIAGNOSIS — I10 Essential (primary) hypertension: Secondary | ICD-10-CM

## 2019-12-20 DIAGNOSIS — R072 Precordial pain: Secondary | ICD-10-CM

## 2019-12-20 DIAGNOSIS — Z720 Tobacco use: Secondary | ICD-10-CM

## 2019-12-20 DIAGNOSIS — I428 Other cardiomyopathies: Secondary | ICD-10-CM | POA: Diagnosis not present

## 2019-12-20 MED ORDER — VARENICLINE TARTRATE 1 MG PO TABS: 1.0000 mg | ORAL_TABLET | Freq: Two times a day (BID) | ORAL | 0 refills | Status: DC

## 2019-12-20 MED ORDER — CHANTIX STARTING MONTH PAK 0.5 MG X 11 & 1 MG X 42 PO TABS: ORAL_TABLET | ORAL | 0 refills | Status: DC

## 2019-12-20 NOTE — Patient Instructions (Addendum)
Medication Instructions:   START CHANTIX STARTER PAK   *If you need a refill on your cardiac medications before your next appointment, please call your pharmacy*  Lab Work: NONE ordered at this time of appointment   If you have labs (blood work) drawn today and your tests are completely normal, you will receive your results only by: Marland Kitchen MyChart Message (if you have MyChart) OR . A paper copy in the mail If you have any lab test that is abnormal or we need to change your treatment, we will call you to review the results.  Testing/Procedures: Your physician has requested that you have a lexiscan myoview. For further information please visit [email or web address]. Please follow instruction sheet, as given.    PLEASE SCHEDULE FOR 2-3 WEEKS   Follow-Up: At [facility], you and your health needs are our priority.  As part of our continuing mission to provide you with exceptional heart care, we have created designated Provider Care Teams.  These Care Teams include your primary Cardiologist (physician) and Advanced Practice Providers (APPs -  Physician Assistants and Nurse Practitioners) who all work together to provide you with the care you need, when you need it.  Your next appointment:   2-3 month(s)  The format for your next appointment:   In Person  Provider:   K. Mali Hilty, MD  Other Instructions

## 2019-12-20 NOTE — Progress Notes (Signed)
Cardiology Office Note:    Date:  12/22/2019   ID:  [name], DOB [birth year], MRN [id number]  PCP:  Redmond School, MD  Cardiologist:  Pixie Casino, MD  Electrophysiologist:  None   Referring MD: Redmond School, MD   Chief Complaint  Patient presents with  . Follow-up  . Chest Pain  . Edema  . Headache  . Shortness of Breath    History of Present Illness:    [name] is a 54 y.o. female with a hx of HTN, HLD, and tobacco abuse.In 2013, she was evaluated for chest pain with left heart cath which showed preserved LV function and no significant obstructive coronary artery disease. Previously has been reported there was a 50-60% stenosis in mid RCA which was noted to be completely resolved in 2013 favoring diagnosis of coronary spasm. She has a history of Addison's disease, hypothyroidism, prolonged QT interval and remote history of nonischemic cardiomyopathy with EF 40-45% which later normalized.Last echocardiogram obtained on 05/26/2015 showed EF 55 to 60%, grade 1 DD. I last saw the patient in April 2020 for preoperative clearance prior to her lumbar laminectomy and resection of synovial cyst by Dr. Sherwood Gambler.  She was admitted in December 2020 with pleuritic chest pain, headache, dizziness and a fever with T-max 102.9.  She was exposed to relatives who have tested positive for COVID-19, although her own COVID-19 test was negative.  She also had intractable emesis and AKI that resolved with antiemetic and hydration.  She was seen in the ED again February 2021 with bilateral flank pain that worse with movement.  CT scan was negative for acute finding.  She was treated for muscle spasm.  EKG at the time showed normal sinus rhythm without significant ST-T wave changes.  I last saw the patient virtually on 12/17/2019 at which time she complained of chest discomfort.  I added beta-blocker as a trial of medical therapy to see if her symptoms would improve.  Patient presents today for  cardiology office visit.  She continued to have fatigue, shortness of breath with exertion and intermittent chest discomfort.  I recommended Myoview as initial work-up.  I suspect that there is anxiety component behind her symptoms.  I suspect her dyspnea on exertion is related to her chronic smoking.  She is aware that my recommendation is to stop smoking.  I have prescribed her Chantix starter pack to see if this will help her quit.  Past Medical History:  Diagnosis Date  . Addison's disease ([place])   . Adrenal insufficiency ([place])    diagnosed 2012  . Aneurysm ([place])   . Anxiety   . Arthritis   . Astigmatism   . CAD (coronary artery disease)    Cath 2008 EF normal. RCA 50-60, Septal 50%. Myoview 3/12: EF 53% normal perfusion  . Cardiac arrest ([place])    2/2 adissonian crisis  . Cardiomyopathy    resolved  . Chest pain    chronicc  . CHF (congestive heart failure) ([place])   . Chronic back pain   . Chronic diarrhea   . Concussion    sept 28th 2014  . Family history of breast cancer   . Family history of colon cancer   . Gastroparesis   . GERD (gastroesophageal reflux disease)   . HTN (hypertension)   . Hyperlipidemia   . Hypothyroidism   . Mitral valve prolapse   . Nondiabetic gastroparesis   . PONV (postoperative nausea and vomiting)   . QT prolongation   .  Tobacco abuse    down to 2 cigarettes per day  . Vertigo     Past Surgical History:  Procedure Laterality Date  . ABDOMINAL HYSTERECTOMY    . CARDIAC CATHETERIZATION  03/2007   showed 60% lesion in the right coronary artery  . CHOLECYSTECTOMY    . LEFT HEART CATHETERIZATION WITH CORONARY ANGIOGRAM N/A 04/13/2012   Procedure: LEFT HEART CATHETERIZATION WITH CORONARY ANGIOGRAM;  Surgeon: Hillary Bow, MD;  Location: [facility] CATH LAB;  Service: Cardiovascular;  Laterality: N/A;  . LUMBAR LAMINECTOMY/DECOMPRESSION MICRODISCECTOMY Left 12/20/2018   Procedure: Left Lumbar Three-Four Lumbar Laminotomy and Foraminotomy, Lumbar  Four-Five Laminotomy and Foraminotomy with Microdiscectomy and Resection of Synovial Cyst;  Surgeon: Jovita Gamma, MD;  Location: [place];  Service: Neurosurgery;  Laterality: Left;  Left Lumbar 3-4 Lumbar laminotomy, foraminotomy, possible microdiscectomy with possible resection of synovial cyst  . SPINE SURGERY    . VARICOSE VEIN SURGERY    . VESICOVAGINAL FISTULA CLOSURE W/ TAH      Current Medications: Current Meds  Medication Sig  . albuterol (VENTOLIN HFA) 108 (90 Base) MCG/ACT inhaler Inhale 1-2 puffs into the lungs every 6 (six) hours as needed for wheezing or shortness of breath.   Marland Kitchen amLODipine (NORVASC) 10 MG tablet Take 10 mg by mouth daily.   Marland Kitchen aspirin 81 MG chewable tablet Chew 81 mg by mouth daily.   . cholecalciferol (VITAMIN D) 1000 UNITS tablet Take 1,000 Units by mouth daily.  . cyclobenzaprine (FLEXERIL) 10 MG tablet Take 10 mg by mouth 3 (three) times daily as needed for muscle spasms.   Marland Kitchen gabapentin (NEURONTIN) 600 MG tablet Take 600 mg by mouth 4 (four) times daily.  . hydrocortisone (CORTEF) 10 MG tablet Take 10-20 mg by mouth See admin instructions. Takes 20 mg in the morning and 10 mg at night  . levothyroxine (SYNTHROID, LEVOTHROID) 75 MCG tablet Take 75 mcg by mouth daily before breakfast.  . lidocaine (LIDODERM) 5 % Place 1 patch onto the skin daily. Remove & Discard patch within 12 hours or as directed by MD  . meclizine (ANTIVERT) 25 MG tablet 1 or 2 tabs PO q8h prn dizziness (Patient taking differently: Take 25-50 mg by mouth 3 (three) times daily as needed (for vertigo). )  . metoprolol tartrate (LOPRESSOR) 25 MG tablet Take 0.5 tablets (12.5 mg total) by mouth 2 (two) times daily.  Marland Kitchen neomycin-polymyxin b-dexamethasone (MAXITROL) 3.5-10000-0.1 SUSP Place 1 drop into the left eye in the morning, at noon, and at bedtime.  . nitroGLYCERIN (NITROSTAT) 0.4 MG SL tablet Place 1 tablet (0.4 mg total) under the tongue every 5 (five) minutes as needed for chest pain.  Marland Kitchen  ondansetron (ZOFRAN ODT) 4 MG disintegrating tablet Take 1 tablet (4 mg total) by mouth every 8 (eight) hours as needed for nausea.  Marland Kitchen oxyCODONE (ROXICODONE) 15 MG immediate release tablet Take 15 mg by mouth every 4 (four) hours as needed for pain.   . polyethylene glycol (MIRALAX / GLYCOLAX) 17 g packet Take 17 g by mouth daily. (Patient taking differently: Take 17 g by mouth as needed. )  . senna-docusate (SENOKOT-S) 8.6-50 MG tablet Take 2 tablets by mouth at bedtime. (Patient taking differently: Take 1 tablet by mouth at bedtime as needed. )  . zolpidem (AMBIEN) 10 MG tablet Take 10 mg by mouth at bedtime.  . [DISCONTINUED] atorvastatin (LIPITOR) 20 MG tablet Take 20 mg by mouth every morning.   . [DISCONTINUED] furosemide (LASIX) 20 MG tablet Take 20 mg  by mouth daily as needed for fluid.   . [DISCONTINUED] hydrochlorothiazide (HYDRODIURIL) 12.5 MG tablet Take by mouth.     Allergies:   Bee venom, Doxycycline, Erythromycin, Ibuprofen, Penicillins, Sulfa antibiotics, Cat hair extract, Dust mite extract, Tramadol, and Morphine and related   Social History   Socioeconomic History  . Marital status: Single    Spouse name: Not on file  . Number of children: Not on file  . Years of education: Not on file  . Highest education level: Not on file  Occupational History  . Not on file  Tobacco Use  . Smoking status: Current Every Day Smoker    Packs/day: 0.25    Years: 10.00    Pack years: 2.50    Types: Cigarettes  . Smokeless tobacco: Never Used  . Tobacco comment: smokes 2 a day  Substance and Sexual Activity  . Alcohol use: No  . Drug use: No    Comment: Hisrory of Cocaine use   . Sexual activity: Never    Birth control/protection: Surgical  Other Topics Concern  . Not on file  Social History Narrative   Lives in [place] with daughter and son. She is on disability.    Social Determinants of Health   Financial Resource Strain:   . Difficulty of Paying Living Expenses:    Food Insecurity:   . Worried About Charity fundraiser in the Last Year:   . Arboriculturist in the Last Year:   Transportation Needs:   . Film/video editor (Medical):   Marland Kitchen Lack of Transportation (Non-Medical):   Physical Activity:   . Days of Exercise per Week:   . Minutes of Exercise per Session:   Stress:   . Feeling of Stress :   Social Connections:   . Frequency of Communication with Friends and Family:   . Frequency of Social Gatherings with Friends and Family:   . Attends Religious Services:   . Active Member of Clubs or Organizations:   . Attends Archivist Meetings:   Marland Kitchen Marital Status:      Family History: The patient's family history includes Breast cancer (age of onset: 54) in her paternal uncle; Cancer in her father; Coronary artery disease in an other family member; Dementia in her maternal grandmother; Heart attack in her mother; Leukemia (age of onset: 12) in her maternal uncle.  ROS:   Please see the history of present illness.     All other systems reviewed and are negative.  EKGs/Labs/Other Studies Reviewed:    The following studies were reviewed today:  Echo 05/26/2015 LV EF: 55% -  60%  Study Conclusions   - Left ventricle: The cavity size was normal. Wall thickness was  normal. Systolic function was normal. The estimated ejection  fraction was in the range of 55% to 60%. Wall motion was normal;  there were no regional wall motion abnormalities. Doppler  parameters are consistent with abnormal left ventricular  relaxation (grade 1 diastolic dysfunction).  - Atrial septum: There was redundancy of the septum, with  borderline criteria for aneurysm.   Impressions:   - Normal LV systolic function; grade 1 diastolic dysfunction;  trace MR.   EKG:  EKG is ordered today.  The ekg ordered today demonstrates normal sinus rhythm without significant ST-T wave changes.  Recent Labs: 08/05/2019: B Natriuretic Peptide 59.0;  Magnesium 2.2 10/24/2019: ALT 12; BUN 31; Creatinine, Ser 1.32; Hemoglobin 13.7; Platelets 448; Potassium 4.0; Sodium 134  Recent Lipid Panel  Component Value Date/Time   CHOL 183 01/01/2009 1303   TRIG 112.0 01/01/2009 1303   HDL 31.20 (L) 01/01/2009 1303   CHOLHDL 6 01/01/2009 1303   VLDL 22.4 01/01/2009 1303   LDLCALC 129 (H) 01/01/2009 1303    Physical Exam:    VS:  BP 118/84 (BP Location: Left Arm, Patient Position: Sitting, Cuff Size: Normal)   Pulse 68   Temp (!) 97.4 F (36.3 C)   Ht 5' 5.5" (1.664 m)   Wt 177 lb (80.3 kg)   BMI 29.01 kg/m     Wt Readings from Last 3 Encounters:  12/20/19 177 lb (80.3 kg)  12/17/19 174 lb (78.9 kg)  10/24/19 170 lb (77.1 kg)     GEN:  Well nourished, well developed in no acute distress HEENT: Normal NECK: No JVD; No carotid bruits LYMPHATICS: No lymphadenopathy CARDIAC: RRR, no murmurs, rubs, gallops RESPIRATORY:  Clear to auscultation without rales, wheezing or rhonchi  ABDOMEN: Soft, non-tender, non-distended MUSCULOSKELETAL:  No edema; No deformity  SKIN: Warm and dry NEUROLOGIC:  Alert and oriented x 3 PSYCHIATRIC:  Normal affect   ASSESSMENT:    1. Precordial pain   2. Essential hypertension   3. NICM (nonischemic cardiomyopathy) ([place])   4. Hypothyroidism, unspecified type   5. Tobacco abuse    PLAN:    In order of problems listed above:  1. Precordial chest pain: Patient has a history of nonischemic cardiomyopathy, given her recent atypical chest pain, I recommend a Myoview  2. Hypertension: Blood pressure stable  3. Hypothyroidism: On levothyroxine, managed by primary care provider  4. Nonischemic cardiomyopathy: Normal ejection fraction on last echocardiogram.  5. Tobacco abuse: Start on Chantix starter pack.   Medication Adjustments/Labs and Tests Ordered: Current medicines are reviewed at length with the patient today.  Concerns regarding medicines are outlined above.  Orders Placed This Encounter   Procedures  . MYOCARDIAL PERFUSION IMAGING  . EKG 12-Lead   Meds ordered this encounter  Medications  . varenicline (CHANTIX STARTING MONTH PAK) 0.5 MG X 11 & 1 MG X 42 tablet    Sig: Take one 0.5 mg tablet by mouth once daily for 3 days, then increase to one 0.5 mg tablet twice daily for 4 days, then increase to one 1 mg tablet twice daily.    Dispense:  53 tablet    Refill:  0  . varenicline (CHANTIX CONTINUING MONTH PAK) 1 MG tablet    Sig: Take 1 tablet (1 mg total) by mouth 2 (two) times daily.    Dispense:  60 tablet    Refill:  0    Patient Instructions  Medication Instructions:   START CHANTIX STARTER PAK   *If you need a refill on your cardiac medications before your next appointment, please call your pharmacy*  Lab Work: NONE ordered at this time of appointment   If you have labs (blood work) drawn today and your tests are completely normal, you will receive your results only by: Marland Kitchen MyChart Message (if you have MyChart) OR . A paper copy in the mail If you have any lab test that is abnormal or we need to change your treatment, we will call you to review the results.  Testing/Procedures: Your physician has requested that you have a lexiscan myoview. For further information please visit [email or web address]. Please follow instruction sheet, as given.    PLEASE SCHEDULE FOR 2-3 WEEKS   Follow-Up: At [facility], you and your health needs are our priority.  As  part of our continuing mission to provide you with exceptional heart care, we have created designated Provider Care Teams.  These Care Teams include your primary Cardiologist (physician) and Advanced Practice Providers (APPs -  Physician Assistants and Nurse Practitioners) who all work together to provide you with the care you need, when you need it.  Your next appointment:   2-3 month(s)  The format for your next appointment:   In Person  Provider:   K. Mali Hilty, MD  Other Instructions        Signed, [name], [place]  12/22/2019 11:07 PM    [place]

## 2019-12-21 ENCOUNTER — Telehealth: Payer: Self-pay | Admitting: Physician Assistant

## 2019-12-21 NOTE — Telephone Encounter (Signed)
Left message for patient to call and schedule Lexiscan myoview ordered by [name], PA and 2-3 month follow up with Dr. Debara Pickett

## 2019-12-31 ENCOUNTER — Encounter: Payer: Self-pay | Admitting: Genetic Counselor

## 2019-12-31 ENCOUNTER — Telehealth: Payer: Self-pay | Admitting: Genetic Counselor

## 2019-12-31 DIAGNOSIS — Z1379 Encounter for other screening for genetic and chromosomal anomalies: Secondary | ICD-10-CM | POA: Insufficient documentation

## 2019-12-31 NOTE — Telephone Encounter (Signed)
LM on VM that results are back and to please call. 

## 2020-01-01 NOTE — Telephone Encounter (Signed)
LM on VM that results were back and to please call.  Left CB instructions. 

## 2020-01-03 ENCOUNTER — Telehealth (HOSPITAL_COMMUNITY): Payer: Self-pay

## 2020-01-03 ENCOUNTER — Encounter: Payer: Self-pay | Admitting: Genetic Counselor

## 2020-01-03 NOTE — Telephone Encounter (Signed)
LM on VM that results are back. Let her know that I will write a letter about this as well.

## 2020-01-03 NOTE — Telephone Encounter (Signed)
Encounter complete. 

## 2020-01-04 ENCOUNTER — Telehealth (HOSPITAL_COMMUNITY): Payer: Self-pay

## 2020-01-04 DIAGNOSIS — R251 Tremor, unspecified: Secondary | ICD-10-CM | POA: Diagnosis not present

## 2020-01-04 DIAGNOSIS — M545 Low back pain: Secondary | ICD-10-CM | POA: Diagnosis not present

## 2020-01-04 DIAGNOSIS — E7849 Other hyperlipidemia: Secondary | ICD-10-CM | POA: Diagnosis not present

## 2020-01-04 DIAGNOSIS — E271 Primary adrenocortical insufficiency: Secondary | ICD-10-CM | POA: Diagnosis not present

## 2020-01-04 DIAGNOSIS — M48061 Spinal stenosis, lumbar region without neurogenic claudication: Secondary | ICD-10-CM | POA: Diagnosis not present

## 2020-01-04 DIAGNOSIS — G5601 Carpal tunnel syndrome, right upper limb: Secondary | ICD-10-CM | POA: Diagnosis not present

## 2020-01-04 DIAGNOSIS — I2510 Atherosclerotic heart disease of native coronary artery without angina pectoris: Secondary | ICD-10-CM | POA: Diagnosis not present

## 2020-01-04 DIAGNOSIS — R252 Cramp and spasm: Secondary | ICD-10-CM | POA: Diagnosis not present

## 2020-01-04 NOTE — Telephone Encounter (Signed)
Encounter complete. 

## 2020-01-08 ENCOUNTER — Telehealth: Payer: Self-pay | Admitting: *Deleted

## 2020-01-08 ENCOUNTER — Ambulatory Visit (HOSPITAL_COMMUNITY)
Admission: RE | Admit: 2020-01-08 | Discharge: 2020-01-08 | Disposition: A | Discharge status: Home / Self Care | Payer: Medicare HMO | Source: Ambulatory Visit | Attending: Cardiovascular Disease | Admitting: Cardiovascular Disease

## 2020-01-08 ENCOUNTER — Other Ambulatory Visit: Payer: Self-pay

## 2020-01-08 DIAGNOSIS — R072 Precordial pain: Secondary | ICD-10-CM | POA: Insufficient documentation

## 2020-01-08 LAB — MYOCARDIAL PERFUSION IMAGING
LV dias vol: 121 mL (ref 46–106)
LV sys vol: 50 mL
Peak HR: 83 {beats}/min
Rest HR: 69 {beats}/min
SDS: 7
SRS: 1
SSS: 8
TID: 1.12

## 2020-01-08 MED ORDER — TECHNETIUM TC 99M TETROFOSMIN IV KIT
10.8000 | PACK | Freq: Once | INTRAVENOUS | Status: AC | PRN
  Administered 2020-01-08: 10.8 via INTRAVENOUS
  Filled 2020-01-08: qty 11

## 2020-01-08 MED ORDER — TECHNETIUM TC 99M TETROFOSMIN IV KIT
29.4000 | PACK | Freq: Once | INTRAVENOUS | Status: AC | PRN
  Administered 2020-01-08: 29.4 via INTRAVENOUS
  Filled 2020-01-08: qty 30

## 2020-01-08 MED ORDER — REGADENOSON 0.4 MG/5ML IV SOLN
0.4000 mg | Freq: Once | INTRAVENOUS | Status: AC
  Administered 2020-01-08: 0.4 mg via INTRAVENOUS

## 2020-01-08 NOTE — Telephone Encounter (Signed)
The patient came in for her stress test which was completed. After the stress test, she asked to speak to a triage nurse.  The patient stated that since she was started on Metoprolol 12.5 mg bid that she has been feeling poorly with complaints of dizziness, falling asleep, and urinating on herself.  Per [name], she has been advised that she should stop the Metoprolol. She has verbalized her understanding. She has been advised to call back in a few days with an update.

## 2020-01-09 ENCOUNTER — Telehealth: Payer: Self-pay | Admitting: Internal Medicine

## 2020-01-09 ENCOUNTER — Telehealth: Payer: Self-pay | Admitting: Genetic Counselor

## 2020-01-09 NOTE — Progress Notes (Signed)
Stress test reassuring, overall low risk study. There is a spot near the front of the heart that has a shadow over it, given atypical nature of her chest pain, this is probably artifact created by breast.

## 2020-01-09 NOTE — Telephone Encounter (Signed)
Thanks . .completely agree with that recommendation.  Dr Lemmie Evens

## 2020-01-09 NOTE — Telephone Encounter (Signed)
LM with daughter that I was trying to reach her.  Daughter will let Jaleen know I called.

## 2020-01-09 NOTE — Telephone Encounter (Signed)
Spoke with patient her arms and hands are drawing up, was jerking all over, legs gave away, swelling nausea, and confusion She stated something was wrong with her and she was scared Advised patient call 911,stated she was at office right beside [employer]. Advised patient to seek care right away to be assessed, verbalized understanding

## 2020-01-09 NOTE — Telephone Encounter (Signed)
New Message    Pt c/o swelling: STAT is pt has developed SOB within 24 hours  1) How much weight have you gained and in what time span? About 5 pounds in the last day or so   2) If swelling, where is the swelling located? Both legs swelling   3) Are you currently taking a fluid pill? No   4) Are you currently SOB? Yes, she says Breathing is off, hands and arms she says feels like they are jerking.   5) Do you have a log of your daily weights (if so, list)? Yes   6) Have you gained 3 pounds in a day or 5 pounds in a week? 5 Pounds in the last day   7) Have you traveled recently? No

## 2020-01-10 ENCOUNTER — Telehealth: Payer: Self-pay

## 2020-01-10 DIAGNOSIS — M25462 Effusion, left knee: Secondary | ICD-10-CM | POA: Diagnosis not present

## 2020-01-10 DIAGNOSIS — I10 Essential (primary) hypertension: Secondary | ICD-10-CM | POA: Diagnosis not present

## 2020-01-10 DIAGNOSIS — R296 Repeated falls: Secondary | ICD-10-CM | POA: Diagnosis not present

## 2020-01-10 DIAGNOSIS — R079 Chest pain, unspecified: Secondary | ICD-10-CM | POA: Diagnosis not present

## 2020-01-10 DIAGNOSIS — R0789 Other chest pain: Secondary | ICD-10-CM | POA: Diagnosis not present

## 2020-01-10 DIAGNOSIS — R609 Edema, unspecified: Secondary | ICD-10-CM | POA: Diagnosis not present

## 2020-01-10 DIAGNOSIS — I4891 Unspecified atrial fibrillation: Secondary | ICD-10-CM | POA: Diagnosis not present

## 2020-01-10 DIAGNOSIS — Z9181 History of falling: Secondary | ICD-10-CM | POA: Diagnosis not present

## 2020-01-10 DIAGNOSIS — S0990XA Unspecified injury of head, initial encounter: Secondary | ICD-10-CM | POA: Diagnosis not present

## 2020-01-10 DIAGNOSIS — W1830XA Fall on same level, unspecified, initial encounter: Secondary | ICD-10-CM | POA: Diagnosis not present

## 2020-01-10 DIAGNOSIS — R0902 Hypoxemia: Secondary | ICD-10-CM | POA: Diagnosis not present

## 2020-01-10 DIAGNOSIS — K3184 Gastroparesis: Secondary | ICD-10-CM | POA: Diagnosis not present

## 2020-01-10 DIAGNOSIS — R531 Weakness: Secondary | ICD-10-CM | POA: Diagnosis not present

## 2020-01-10 DIAGNOSIS — M25562 Pain in left knee: Secondary | ICD-10-CM | POA: Diagnosis not present

## 2020-01-10 DIAGNOSIS — R42 Dizziness and giddiness: Secondary | ICD-10-CM | POA: Diagnosis not present

## 2020-01-10 DIAGNOSIS — E039 Hypothyroidism, unspecified: Secondary | ICD-10-CM | POA: Diagnosis not present

## 2020-01-10 DIAGNOSIS — R410 Disorientation, unspecified: Secondary | ICD-10-CM | POA: Diagnosis not present

## 2020-01-10 DIAGNOSIS — E271 Primary adrenocortical insufficiency: Secondary | ICD-10-CM | POA: Diagnosis not present

## 2020-01-10 NOTE — Telephone Encounter (Addendum)
Left a voice message for the patient to give our office a call to go over her recent stress test results.   ----- Message from [name], [place] sent at 01/09/2020  8:23 AM EDT ----- Stress test reassuring, overall low risk study. There is a spot near the front of the heart that has a shadow over it, given atypical nature of her chest pain, this is probably artifact created by breast.

## 2020-01-11 ENCOUNTER — Emergency Department (HOSPITAL_COMMUNITY)
Admission: EM | Admit: 2020-01-11 | Discharge: 2020-01-12 | Disposition: A | Discharge status: Home / Self Care | Payer: Medicare HMO | Attending: Emergency Medicine | Admitting: Emergency Medicine

## 2020-01-11 ENCOUNTER — Telehealth: Payer: Self-pay | Admitting: Genetic Counselor

## 2020-01-11 ENCOUNTER — Other Ambulatory Visit: Payer: Self-pay

## 2020-01-11 ENCOUNTER — Encounter (HOSPITAL_COMMUNITY): Payer: Self-pay

## 2020-01-11 DIAGNOSIS — W19XXXA Unspecified fall, initial encounter: Secondary | ICD-10-CM | POA: Diagnosis not present

## 2020-01-11 DIAGNOSIS — Z79899 Other long term (current) drug therapy: Secondary | ICD-10-CM | POA: Diagnosis not present

## 2020-01-11 DIAGNOSIS — G2402 Drug induced acute dystonia: Secondary | ICD-10-CM | POA: Insufficient documentation

## 2020-01-11 DIAGNOSIS — R296 Repeated falls: Secondary | ICD-10-CM | POA: Diagnosis not present

## 2020-01-11 DIAGNOSIS — I509 Heart failure, unspecified: Secondary | ICD-10-CM | POA: Insufficient documentation

## 2020-01-11 DIAGNOSIS — F17210 Nicotine dependence, cigarettes, uncomplicated: Secondary | ICD-10-CM | POA: Insufficient documentation

## 2020-01-11 DIAGNOSIS — R4182 Altered mental status, unspecified: Secondary | ICD-10-CM | POA: Diagnosis not present

## 2020-01-11 DIAGNOSIS — E876 Hypokalemia: Secondary | ICD-10-CM | POA: Diagnosis not present

## 2020-01-11 DIAGNOSIS — N3001 Acute cystitis with hematuria: Secondary | ICD-10-CM | POA: Diagnosis not present

## 2020-01-11 DIAGNOSIS — I110 Hypertensive heart disease with heart failure: Secondary | ICD-10-CM | POA: Insufficient documentation

## 2020-01-11 DIAGNOSIS — S0990XA Unspecified injury of head, initial encounter: Secondary | ICD-10-CM | POA: Diagnosis not present

## 2020-01-11 DIAGNOSIS — G249 Dystonia, unspecified: Secondary | ICD-10-CM | POA: Diagnosis not present

## 2020-01-11 DIAGNOSIS — R300 Dysuria: Secondary | ICD-10-CM | POA: Diagnosis not present

## 2020-01-11 DIAGNOSIS — E271 Primary adrenocortical insufficiency: Secondary | ICD-10-CM | POA: Diagnosis not present

## 2020-01-11 DIAGNOSIS — T428X5A Adverse effect of antiparkinsonism drugs and other central muscle-tone depressants, initial encounter: Secondary | ICD-10-CM | POA: Diagnosis not present

## 2020-01-11 DIAGNOSIS — R258 Other abnormal involuntary movements: Secondary | ICD-10-CM | POA: Diagnosis present

## 2020-01-11 DIAGNOSIS — M5489 Other dorsalgia: Secondary | ICD-10-CM | POA: Diagnosis not present

## 2020-01-11 DIAGNOSIS — I2510 Atherosclerotic heart disease of native coronary artery without angina pectoris: Secondary | ICD-10-CM | POA: Insufficient documentation

## 2020-01-11 DIAGNOSIS — R410 Disorientation, unspecified: Secondary | ICD-10-CM | POA: Diagnosis not present

## 2020-01-11 DIAGNOSIS — R000 Tachycardia, unspecified: Secondary | ICD-10-CM | POA: Diagnosis not present

## 2020-01-11 DIAGNOSIS — T50905A Adverse effect of unspecified drugs, medicaments and biological substances, initial encounter: Secondary | ICD-10-CM

## 2020-01-11 DIAGNOSIS — R52 Pain, unspecified: Secondary | ICD-10-CM | POA: Diagnosis not present

## 2020-01-11 DIAGNOSIS — I10 Essential (primary) hypertension: Secondary | ICD-10-CM | POA: Diagnosis not present

## 2020-01-11 LAB — COMPREHENSIVE METABOLIC PANEL WITH GFR
ALT: 13 U/L (ref 0–44)
AST: 19 U/L (ref 15–41)
Albumin: 4.2 g/dL (ref 3.5–5.0)
Alkaline Phosphatase: 92 U/L (ref 38–126)
Anion gap: 11 (ref 5–15)
BUN: 21 mg/dL — ABNORMAL HIGH (ref 6–20)
CO2: 25 mmol/L (ref 22–32)
Calcium: 9 mg/dL (ref 8.9–10.3)
Chloride: 102 mmol/L (ref 98–111)
Creatinine, Ser: 1.08 mg/dL — ABNORMAL HIGH (ref 0.44–1.00)
GFR calc Af Amer: >60 mL/min
GFR calc non Af Amer: 58 mL/min — ABNORMAL LOW
Glucose, Bld: 118 mg/dL — ABNORMAL HIGH (ref 70–99)
Potassium: 3.1 mmol/L — ABNORMAL LOW (ref 3.5–5.1)
Sodium: 138 mmol/L (ref 135–145)
Total Bilirubin: 0.5 mg/dL (ref 0.3–1.2)
Total Protein: 7.7 g/dL (ref 6.5–8.1)

## 2020-01-11 LAB — CBC WITH DIFFERENTIAL/PLATELET
Abs Immature Granulocytes: 0.06 K/uL (ref 0.00–0.07)
Basophils Absolute: 0.1 K/uL (ref 0.0–0.1)
Basophils Relative: 1 %
Eosinophils Absolute: 0.2 K/uL (ref 0.0–0.5)
Eosinophils Relative: 1 %
HCT: 42.8 % (ref 36.0–46.0)
Hemoglobin: 14 g/dL (ref 12.0–15.0)
Immature Granulocytes: 0 %
Lymphocytes Relative: 25 %
Lymphs Abs: 4.2 K/uL — ABNORMAL HIGH (ref 0.7–4.0)
MCH: 28.1 pg (ref 26.0–34.0)
MCHC: 32.7 g/dL (ref 30.0–36.0)
MCV: 85.8 fL (ref 80.0–100.0)
Monocytes Absolute: 1.5 K/uL — ABNORMAL HIGH (ref 0.1–1.0)
Monocytes Relative: 9 %
Neutro Abs: 10.7 K/uL — ABNORMAL HIGH (ref 1.7–7.7)
Neutrophils Relative %: 64 %
Platelets: 356 K/uL (ref 150–400)
RBC: 4.99 MIL/uL (ref 3.87–5.11)
RDW: 15.2 % (ref 11.5–15.5)
WBC: 16.8 K/uL — ABNORMAL HIGH (ref 4.0–10.5)
nRBC: 0 % (ref 0.0–0.2)

## 2020-01-11 LAB — MAGNESIUM: Magnesium: 1.6 mg/dL — ABNORMAL LOW (ref 1.7–2.4)

## 2020-01-11 MED ORDER — DIPHENHYDRAMINE HCL 50 MG/ML IJ SOLN
50.0000 mg | Freq: Once | INTRAMUSCULAR | Status: AC
  Administered 2020-01-11: 23:00:00 50 mg via INTRAVENOUS
  Filled 2020-01-11: qty 1

## 2020-01-11 NOTE — Telephone Encounter (Signed)
[id number]

## 2020-01-11 NOTE — Telephone Encounter (Signed)
Called cellphone for pt again but went straight to VM again. Called home number on file, pt's daughter picked up and stated that the number was her cell phone number and that she (herself)  was currently in the hospital. Notified we had tried reaching her mother on her cell phone but it kept going to voicemail. Notified we would try again later.

## 2020-01-11 NOTE — Telephone Encounter (Signed)
Thanks for the FYI.  Dr H 

## 2020-01-11 NOTE — Telephone Encounter (Signed)
Patient is returning call to discuss stress test results. She states she is also requesting to speak with a nurse regarding foot/leg swelling she has been experiencing. Please call.    Pt c/o swelling: STAT is pt has developed SOB within 24 hours  1) How much weight have you gained and in what time span? 8 lbs in 4 days  2) If swelling, where is the swelling located? Feet and legs  3) Are you currently taking a fluid pill? No  4) Are you currently SOB? No  5) Do you have a log of your daily weights (if so, list)? No  6) Have you gained 3 pounds in a day or 5 pounds in a week? Yes  7) Have you traveled recently?

## 2020-01-11 NOTE — Telephone Encounter (Signed)
Revealed negative genetic testing.  Discussed that we do not know why she has colon polyps or why there is cancer in the family. It could be due to a different gene that we are not testing, or maybe our current technology may not be able to pick something up.  It will be important for her to keep in contact with genetics to keep up with whether additional testing may be needed.  

## 2020-01-11 NOTE — ED Triage Notes (Signed)
Pt states she fell on Monday, states she was seen at [facility] on Tuesday for same.  Pt states she feels she should have had a ct and that she is being evaluated for MS.  Pt's Daughter called ems for "change in mental status"   Pt is awake, alert, oriented.

## 2020-01-11 NOTE — Telephone Encounter (Addendum)
Called and spoke with pt reviewed her stress test with her.   she states that she had gained about 8lbs in about a day. She states she had come in for a stress test and woke up the morning of very swollen and had wet the bed. She states that she was dizzy and drowsy during the test. She was told to stop her metoprolol. Since the stress test she has fallen 6 times and this is why she went to the ED in [facility]. She complains of swelling that is 2 inches deep in her legs.  She reports having Addison's disease and was told that she is not to get dehydrated. She reports having taken fluid pills before to help with the swelling but was only on them for a few days at a time then would stop.  She complains of her arms and hands jerking. SOB during her stress test, dizziness and drowsiness, as well as chest pain that comes and goes under her left breast. Denies any current active chest pain. She states she went to [facility] yesterday and they sent her home. She states while she was there they kept her in bed for a time and then had her get up and walk with a walker, she said she did this fine, however, the dizziness does not come unless she has been up and walking for 20+ minutes.  Pt kept getting confused during this discussion. Kept stopping and restarting attempting to remember what she had previously said. Pt is under a lot of stress at the moment, she has been caring for her daughter who is paraplegic and is currently in the hospital.  Pt mentioned a Doctor suggesting a sleep study and a cath. This RN is unsure of which physician she is speaking of.  Told pt I would discuss this situation with the DOD. Spoke with Dr.Hochrein who suggested she seek immediate evaluation in the ED.  Notified pt that with the symptoms she is having it is best for her and recommended by Dr.Hochrein that she seek immediate evaluation in  ED. Pt's plan is to go to [facility] to be evaluated. Pt asking what  she should tell them since she was sent home from [facility]. Notified her that she should tell them everything she has told me on this phone call. Pt verbalized understanding and thankful for the call. No other questions at this time.

## 2020-01-12 ENCOUNTER — Emergency Department (HOSPITAL_COMMUNITY): Payer: Medicare HMO

## 2020-01-12 DIAGNOSIS — R4182 Altered mental status, unspecified: Secondary | ICD-10-CM | POA: Diagnosis not present

## 2020-01-12 DIAGNOSIS — S0990XA Unspecified injury of head, initial encounter: Secondary | ICD-10-CM | POA: Diagnosis not present

## 2020-01-12 LAB — URINALYSIS, ROUTINE W REFLEX MICROSCOPIC
Bacteria, UA: NONE SEEN
Bilirubin Urine: NEGATIVE
Glucose, UA: NEGATIVE mg/dL
Hgb urine dipstick: NEGATIVE
Ketones, ur: NEGATIVE mg/dL
Nitrite: NEGATIVE
Protein, ur: NEGATIVE mg/dL
Specific Gravity, Urine: 1.02 (ref 1.005–1.030)
pH: 5 (ref 5.0–8.0)

## 2020-01-12 MED ORDER — KETOROLAC TROMETHAMINE 30 MG/ML IJ SOLN
15.0000 mg | Freq: Once | INTRAMUSCULAR | Status: AC
  Administered 2020-01-12: 02:00:00 15 mg via INTRAVENOUS
  Filled 2020-01-12: qty 1

## 2020-01-12 MED ORDER — NITROFURANTOIN MONOHYD MACRO 100 MG PO CAPS: 100.0000 mg | ORAL_CAPSULE | Freq: Two times a day (BID) | ORAL | 0 refills | Status: DC

## 2020-01-12 MED ORDER — POTASSIUM CHLORIDE CRYS ER 20 MEQ PO TBCR
40.0000 meq | EXTENDED_RELEASE_TABLET | Freq: Once | ORAL | Status: AC
  Administered 2020-01-12: 01:00:00 40 meq via ORAL
  Filled 2020-01-12: qty 2

## 2020-01-12 MED ORDER — NITROFURANTOIN MONOHYD MACRO 100 MG PO CAPS
100.0000 mg | ORAL_CAPSULE | Freq: Once | ORAL | Status: AC
  Administered 2020-01-12: 02:00:00 100 mg via ORAL
  Filled 2020-01-12: qty 1

## 2020-01-12 MED ORDER — DIPHENHYDRAMINE HCL 25 MG PO TABS: 25.0000 mg | ORAL_TABLET | Freq: Four times a day (QID) | ORAL | 0 refills | Status: DC | PRN

## 2020-01-12 MED ORDER — POTASSIUM CHLORIDE CRYS ER 20 MEQ PO TBCR
20.0000 meq | EXTENDED_RELEASE_TABLET | Freq: Two times a day (BID) | ORAL | 0 refills | Status: DC
Start: 2020-01-12 — End: 2020-06-10

## 2020-01-12 MED ORDER — MAGNESIUM SULFATE 2 GM/50ML IV SOLN
2.0000 g | Freq: Once | INTRAVENOUS | Status: AC
  Administered 2020-01-12: 01:00:00 2 g via INTRAVENOUS
  Filled 2020-01-12: qty 50

## 2020-01-12 NOTE — ED Notes (Signed)
Pt ambulated about 50 feet with a walker before she stated the muscle spasms in her back were too bad to continue walking. Pt placed in wheelchair and taken back to room. Pt states she was given a wheelchair last night at [facility] since she has been falling so much at home.

## 2020-01-12 NOTE — Discharge Instructions (Addendum)
Continue taking benadryl as prescribed if your muscle jerking returns - you will need to continue taking this medicine until the baclofen side effects have worn off.  You have a urinary tract infection and have been prescribed antibiotics for this - take your next dose tomorrow morning. Make sure you are drinking plenty of fluids.  DO NOT take any more baclofen tablets.  Call Dr. Freddie Apley office Monday to let him know you had this reaction. You have also been prescribed additional potassium to take for the next week to get your potassium well replaced.

## 2020-01-12 NOTE — ED Provider Notes (Signed)
[facility] EMERGENCY DEPARTMENT Provider Note   CSN: [id number] Arrival date & time: 01/11/20  2129     History Chief Complaint  Patient presents with  . Uncontrolled jerking movements.    [name] is a 54 y.o. female who has a history of adrenal insufficiency, anxiety, chronic arthritis, CAD, CHF and history of vertigo presenting with complaints of uncontrolled jerking of her arms and back.  She describes having a general tremor in her extremities which is been present for at least the past several weeks and has noticed weakness in her hands, endorsing that she keeps dropping items that she is trying to hang onto, also has had several falls, 3 just this week involving head injury but no LOC.  Daughter at the bedside has witnessed several other falls, stating her mother can simply be standing and then stumbles to one side before falling.  She denies current vertigo, no room spinning symptoms, also no lightheadedness or presyncopal type symptoms.  She reports having been on Flexeril for a long period of time secondary to muscle spasms.  She has recently establish care with Dr. Merlene Laughter to further assess her extremity weakness and tremor and is awaiting nerve conduction studies, patient also states he is planning to evaluate her for possible MS.  He has discontinued her Flexeril and prescribed baclofen in its place which she started on April 30.  Since this date she has had escalation of uncontrolled jerking in her extremities back and neck.  Daughter at the bedside also endorses changes in her mother's cognition, stating she seems more confused than baseline.  Of note, she also has recently been prescribed Chantix for smoking cessation, but patient states that she has not started this medication yet.  The history is provided by the patient and a relative (daughter at bedside).       Past Medical History:  Diagnosis Date  . Addison's disease ([place])   . Adrenal insufficiency ([place])     diagnosed 2012  . Aneurysm ([place])   . Anxiety   . Arthritis   . Astigmatism   . CAD (coronary artery disease)    Cath 2008 EF normal. RCA 50-60, Septal 50%. Myoview 3/12: EF 53% normal perfusion  . Cardiac arrest ([place])    2/2 adissonian crisis  . Cardiomyopathy    resolved  . Chest pain    chronicc  . CHF (congestive heart failure) ([place])   . Chronic back pain   . Chronic diarrhea   . Concussion    sept 28th 2014  . Family history of breast cancer   . Family history of colon cancer   . Gastroparesis   . GERD (gastroesophageal reflux disease)   . HTN (hypertension)   . Hyperlipidemia   . Hypothyroidism   . Mitral valve prolapse   . Nondiabetic gastroparesis   . PONV (postoperative nausea and vomiting)   . QT prolongation   . Tobacco abuse    down to 2 cigarettes per day  . Vertigo     Patient Active Problem List   Diagnosis Date Noted  . Genetic testing 12/31/2019  . AKI (acute kidney injury) ([place]) 08/06/2019  . Emesis, persistent 08/06/2019  . Viral syndrome 08/05/2019  . Tobacco abuse   . History of colonic polyps 07/23/2019  . Family history of breast cancer   . Family history of colon cancer   . Synovial cyst of lumbar spine 12/20/2018  . Myositis 05/11/2016  . Anxiety 05/26/2015  . Tremors of nervous  system 05/26/2015  . Neck pain 05/26/2015  . Hot flushes, perimenopausal 07/24/2013  . CAD (coronary artery disease) 04/11/2012  . GERD (gastroesophageal reflux disease) 04/11/2012  . Addison's disease ([place]) 03/03/2012  . QT prolongation 12/15/2010  . Palpitations 01/01/2009  . Hypothyroidism 12/31/2008  . Hyperlipidemia 12/31/2008  . OVERWEIGHT/OBESITY 12/31/2008  . Essential hypertension 12/31/2008    Past Surgical History:  Procedure Laterality Date  . ABDOMINAL HYSTERECTOMY    . CARDIAC CATHETERIZATION  03/2007   showed 60% lesion in the right coronary artery  . CHOLECYSTECTOMY    . LEFT HEART CATHETERIZATION WITH CORONARY ANGIOGRAM N/A 04/13/2012    Procedure: LEFT HEART CATHETERIZATION WITH CORONARY ANGIOGRAM;  Surgeon: Hillary Bow, MD;  Location: [facility] CATH LAB;  Service: Cardiovascular;  Laterality: N/A;  . LUMBAR LAMINECTOMY/DECOMPRESSION MICRODISCECTOMY Left 12/20/2018   Procedure: Left Lumbar Three-Four Lumbar Laminotomy and Foraminotomy, Lumbar Four-Five Laminotomy and Foraminotomy with Microdiscectomy and Resection of Synovial Cyst;  Surgeon: Jovita Gamma, MD;  Location: [place];  Service: Neurosurgery;  Laterality: Left;  Left Lumbar 3-4 Lumbar laminotomy, foraminotomy, possible microdiscectomy with possible resection of synovial cyst  . SPINE SURGERY    . VARICOSE VEIN SURGERY    . VESICOVAGINAL FISTULA CLOSURE W/ TAH       OB History    Gravida  3   Para  2   Term  2   Preterm  0   AB  1   Living  2     SAB  1   TAB      Ectopic      Multiple      Live Births              Family History  Problem Relation Age of Onset  . Cancer Father        Colon  . Coronary artery disease Other   . Heart attack Mother        d. 28  . Breast cancer Paternal Uncle 51  . Dementia Maternal Grandmother   . Leukemia Maternal Uncle 1    Social History   Tobacco Use  . Smoking status: Current Every Day Smoker    Packs/day: 0.25    Years: 10.00    Pack years: 2.50    Types: Cigarettes  . Smokeless tobacco: Never Used  . Tobacco comment: smokes 2 a day  Substance Use Topics  . Alcohol use: No  . Drug use: No    Comment: Hisrory of Cocaine use     Home Medications Prior to Admission medications   Medication Sig Start Date End Date Taking? Authorizing Provider  albuterol (VENTOLIN HFA) 108 (90 Base) MCG/ACT inhaler Inhale 1-2 puffs into the lungs every 6 (six) hours as needed for wheezing or shortness of breath.  10/22/19   [provider]  amLODipine (NORVASC) 10 MG tablet Take 10 mg by mouth daily.  04/05/17   [provider]  aspirin 81 MG chewable tablet Chew 81 mg by mouth daily.      [provider]  cholecalciferol (VITAMIN D) 1000 UNITS tablet Take 1,000 Units by mouth daily.    [provider]  cyclobenzaprine (FLEXERIL) 10 MG tablet Take 10 mg by mouth 3 (three) times daily as needed for muscle spasms.  10/11/18   [provider]  diphenhydrAMINE (BENADRYL) 25 MG tablet Take 1 tablet (25 mg total) by mouth every 6 (six) hours as needed (return of jerking symptoms). 01/12/20   Evalee Jefferson, PA-C  gabapentin (NEURONTIN)  600 MG tablet Take 600 mg by mouth 4 (four) times daily. 07/25/19   [provider]  hydrocortisone (CORTEF) 10 MG tablet Take 10-20 mg by mouth See admin instructions. Takes 20 mg in the morning and 10 mg at night 04/07/16   [provider]  levothyroxine (SYNTHROID, LEVOTHROID) 75 MCG tablet Take 75 mcg by mouth daily before breakfast.    [provider]  lidocaine (LIDODERM) 5 % Place 1 patch onto the skin daily. Remove & Discard patch within 12 hours or as directed by MD 10/24/19   Evalee Jefferson, PA-C  meclizine (ANTIVERT) 25 MG tablet 1 or 2 tabs PO q8h prn dizziness Patient taking differently: Take 25-50 mg by mouth 3 (three) times daily as needed (for vertigo).  04/16/16   Francine Graven, DO  neomycin-polymyxin b-dexamethasone (MAXITROL) 3.5-10000-0.1 SUSP Place 1 drop into the left eye in the morning, at noon, and at bedtime. 10/22/19   [provider]  nitrofurantoin, macrocrystal-monohydrate, (MACROBID) 100 MG capsule Take 1 capsule (100 mg total) by mouth 2 (two) times daily. 01/12/20   Evalee Jefferson, PA-C  nitroGLYCERIN (NITROSTAT) 0.4 MG SL tablet Place 1 tablet (0.4 mg total) under the tongue every 5 (five) minutes as needed for chest pain. 12/17/19   [name], PA  ondansetron (ZOFRAN ODT) 4 MG disintegrating tablet Take 1 tablet (4 mg total) by mouth every 8 (eight) hours as needed for nausea. 08/07/19   Roxan Hockey, MD  oxyCODONE (ROXICODONE) 15 MG immediate release tablet Take 15 mg by mouth  every 4 (four) hours as needed for pain.  07/27/19   [provider]  polyethylene glycol (MIRALAX / GLYCOLAX) 17 g packet Take 17 g by mouth daily. Patient taking differently: Take 17 g by mouth as needed.  08/08/19   Roxan Hockey, MD  potassium chloride SA (KLOR-CON) 20 MEQ tablet Take 1 tablet (20 mEq total) by mouth 2 (two) times daily. 01/12/20   Vinie Charity, Almyra Free, PA-C  senna-docusate (SENOKOT-S) 8.6-50 MG tablet Take 2 tablets by mouth at bedtime. Patient taking differently: Take 1 tablet by mouth at bedtime as needed.  08/07/19 08/06/20  Roxan Hockey, MD  varenicline (CHANTIX CONTINUING MONTH PAK) 1 MG tablet Take 1 tablet (1 mg total) by mouth 2 (two) times daily. 12/20/19   [name], PA  varenicline (CHANTIX STARTING MONTH PAK) 0.5 MG X 11 & 1 MG X 42 tablet Take one 0.5 mg tablet by mouth once daily for 3 days, then increase to one 0.5 mg tablet twice daily for 4 days, then increase to one 1 mg tablet twice daily. 12/20/19   [name], PA  zolpidem (AMBIEN) 10 MG tablet Take 10 mg by mouth at bedtime. 05/06/16   [provider]    Allergies    Bee venom, Doxycycline, Erythromycin, Ibuprofen, Penicillins, Sulfa antibiotics, Cat hair extract, Dust mite extract, Tramadol, and Morphine and related  Review of Systems   Review of Systems  Constitutional: Negative for chills and fever.  HENT: Negative for congestion and sore throat.   Eyes: Negative.   Respiratory: Negative for chest tightness and shortness of breath.   Cardiovascular: Negative for chest pain.  Gastrointestinal: Negative for abdominal pain, nausea and vomiting.  Genitourinary: Negative.   Musculoskeletal: Negative for arthralgias, joint swelling and neck pain.  Skin: Negative.  Negative for rash and wound.  Neurological: Positive for tremors and weakness. Negative for dizziness, seizures, light-headedness, numbness and headaches.  Psychiatric/Behavioral: Negative.     Physical Exam Updated Vital  Signs BP 140/82   Pulse 85   Temp 98.7 F (37.1 C) (Oral)   Resp 15   Ht 5\' 5"  (1.651 m)   Wt 81.6 kg   SpO2 94%   BMI 29.95 kg/m   Physical Exam Vitals and nursing note reviewed.  Constitutional:      Appearance: She is well-developed.     Comments: Alert and oriented.  Anxious  HENT:     Head: Normocephalic and atraumatic.     Mouth/Throat:     Mouth: Mucous membranes are moist.  Eyes:     General: Gaze aligned appropriately. No visual field deficit.    Extraocular Movements:     Right eye: Abnormal extraocular motion present.     Left eye: Abnormal extraocular motion present.     Conjunctiva/sclera: Conjunctivae normal.     Pupils: Pupils are equal, round, and reactive to light.     Comments: Patient had difficulty tracking EOMs.    Cardiovascular:     Rate and Rhythm: Normal rate and regular rhythm.     Heart sounds: Normal heart sounds.  Pulmonary:     Effort: Pulmonary effort is normal.     Breath sounds: Normal breath sounds. No wheezing.  Abdominal:     General: Bowel sounds are normal.     Palpations: Abdomen is soft.     Tenderness: There is no abdominal tenderness.  Musculoskeletal:        General: No tenderness or deformity. Normal range of motion.     Cervical back: Normal range of motion.  Skin:    General: Skin is warm and dry.  Neurological:     Mental Status: She is alert and oriented to person, place, and time.     Sensory: Sensation is intact.     Motor: Tremor present.     Coordination: Coordination abnormal. Finger-Nose-Finger Test abnormal. Impaired rapid alternating movements.     Comments: Pronounced dystonia in extremities including neck shoulders and back.     ED Results / Procedures / Treatments   Labs (all labs ordered are listed, but only abnormal results are displayed) Labs Reviewed  CBC WITH DIFFERENTIAL/PLATELET - Abnormal; Notable for the following components:      Result Value   WBC 16.8 (*)    Neutro Abs 10.7 (*)     Lymphs Abs 4.2 (*)    Monocytes Absolute 1.5 (*)    All other components within normal limits  COMPREHENSIVE METABOLIC PANEL - Abnormal; Notable for the following components:   Potassium 3.1 (*)    Glucose, Bld 118 (*)    BUN 21 (*)    Creatinine, Ser 1.08 (*)    GFR calc non Af Amer 58 (*)    All other components within normal limits  URINALYSIS, ROUTINE W REFLEX MICROSCOPIC - Abnormal; Notable for the following components:   APPearance HAZY (*)    Leukocytes,Ua MODERATE (*)    All other components within normal limits  MAGNESIUM - Abnormal; Notable for the following components:   Magnesium 1.6 (*)    All other components within normal limits  URINE CULTURE    EKG EKG Interpretation  Date/Time:  Friday Jan 11 2020 23:01:50 EDT Ventricular Rate:  88 PR Interval:    QRS Duration: 95 QT Interval:  382 QTC Calculation: 463 R Axis:   84 Text Interpretation: Sinus rhythm Borderline short PR interval Minimal ST depression, diffuse leads Baseline wander in lead(s) V1 Since last tracing rate faster 24 Oct 2019 Confirmed by  Rolland Porter [phone]) on 01/12/2020 1:43:13 AM   Radiology CT Head Wo Contrast  Result Date: 01/12/2020 CLINICAL DATA:  [place] 4 days ago, change in mental status EXAM: CT HEAD WITHOUT CONTRAST TECHNIQUE: Contiguous axial images were obtained from the base of the skull through the vertex without intravenous contrast. COMPARISON:  01/10/2020 FINDINGS: Brain: No acute infarct or hemorrhage. Lateral ventricles and midline structures remain unremarkable. No acute extra-axial fluid collections. No mass effect. Vascular: No hyperdense vessel or unexpected calcification. Skull: Normal. Negative for fracture or focal lesion. Sinuses/Orbits: No acute finding. Other: None. IMPRESSION: 1. Stable head CT, no acute process. Electronically Signed   By: Randa Ngo M.D.   On: 01/12/2020 00:42    Procedures Procedures (including critical care time)  Medications Ordered in  ED Medications  ketorolac (TORADOL) 30 MG/ML injection 15 mg (has no administration in time range)  nitrofurantoin (macrocrystal-monohydrate) (MACROBID) capsule 100 mg (has no administration in time range)  diphenhydrAMINE (BENADRYL) injection 50 mg (50 mg Intravenous Given 01/11/20 2306)  potassium chloride SA (KLOR-CON) CR tablet 40 mEq (40 mEq Oral Given 01/12/20 0117)  magnesium sulfate IVPB 2 g 50 mL (0 g Intravenous Stopped 01/12/20 0136)    ED Course  I have reviewed the triage vital signs and the nursing notes.  Pertinent labs & imaging results that were available during my care of the patient were reviewed by me and considered in my medical decision making (see chart for details).    MDM Rules/Calculators/A&P                      Patient with suspected dystonic reaction to the baclofen which is a new medication which she started 8 days ago.  She was given an IV dose of Benadryl 50 mg and the dystonia completely resolved.  She still has a mild tremor in her hands but has no dystonic symptoms at this time.  She is also much more relaxed and calm.  Labs and imaging reviewed.  No brain trauma from her multiple falls.  She does have complaint of low back pain which is chronic, but worse with the muscle jerking actions, stating she feels real tight across to her lower back and also her neck.  Her labs show abnormally low potassium and magnesium levels.  She was given IV magnesium 2 g, replace potassium with p.o. 40 mEq dose.  She was also prescribed additional potassium to finish this replacement.  She was advised to absolutely discontinue her baclofen and to call Dr. Merlene Laughter on Monday to advise him of this suspected adverse reaction.  She states she is scheduled for nerve conduction study in about 2 weeks with him.  She was advised to continue taking Benadryl if her dystonia symptoms return.  Patient also endorses she has been having mild dysuria symptoms, urinalysis has been collected.  We will  plan discharge home with antibiotics as UA is positive for uti, culture sent.  macrobid started.  Final Clinical Impression(s) / ED Diagnoses Final diagnoses:  Medication side effect, initial encounter  Dystonia  Hypokalemia  Hypomagnesemia  Acute cystitis with hematuria    Rx / DC Orders ED Discharge Orders         Ordered    diphenhydrAMINE (BENADRYL) 25 MG tablet  Every 6 hours PRN     01/12/20 0144    nitrofurantoin, macrocrystal-monohydrate, (MACROBID) 100 MG capsule  2 times daily     01/12/20 0144    potassium chloride SA (KLOR-CON) 20  MEQ tablet  2 times daily     01/12/20 0153           Evalee Jefferson, PA-C 01/12/20 0154    Noemi Chapel, MD 01/12/20 [phone]

## 2020-01-13 LAB — URINE CULTURE: Culture: 70000 — AB

## 2020-01-14 ENCOUNTER — Telehealth: Payer: Self-pay | Admitting: *Deleted

## 2020-01-14 NOTE — Telephone Encounter (Signed)
Post ED Visit - Positive Culture Follow-up  Culture report reviewed by antimicrobial stewardship pharmacist: [facility] Team []  Elenor Quinones, Pharm.D. []  Heide Guile, Pharm.D., BCPS AQ-ID []  Parks Neptune, Pharm.D., BCPS []  Alycia Rossetti, Pharm.D., BCPS []  [place], Pharm.D., BCPS, AAHIVP []  Legrand Como, Pharm.D., BCPS, AAHIVP []  Salome Arnt, PharmD, BCPS []  Johnnette Gourd, PharmD, BCPS []  Hughes Better, PharmD, BCPS []  Leeroy Cha, PharmD []  Laqueta Linden, PharmD, BCPS []  Albertina Parr, PharmD  [place] Team []  Leodis Sias, PharmD []  Lindell Spar, PharmD []  Royetta Asal, PharmD []  Graylin Shiver, Rph []  Rema Fendt) Glennon Mac, PharmD []  Arlyn Dunning, PharmD []  Netta Cedars, PharmD []  Dia Sitter, PharmD []  Leone Haven, PharmD []  Gretta Arab, PharmD []  Theodis Shove, PharmD []  Peggyann Juba, PharmD []  Reuel Boom, PharmD   Positive urine culture, reviewed by Nicoletta Dress, Pharm D Treated with Nitrofurantoin Monohyd Macro, organism sensitive to the same and no further patient follow-up is required at this time.  Harlon Flor [facility] 01/14/2020, 9:56 AM

## 2020-01-15 ENCOUNTER — Ambulatory Visit: Payer: Self-pay | Admitting: Genetic Counselor

## 2020-01-15 DIAGNOSIS — Z1379 Encounter for other screening for genetic and chromosomal anomalies: Secondary | ICD-10-CM

## 2020-01-15 NOTE — Progress Notes (Signed)
HPI:  Ms. Florer was previously seen in the [place] clinic due to a family history of cancer and concerns regarding a hereditary predisposition to cancer. Please refer to our prior cancer genetics clinic note for more information regarding our discussion, assessment and recommendations, at the time. Ms. Brandner recent genetic test results were disclosed to her, as were recommendations warranted by these results. These results and recommendations are discussed in more detail below.  CANCER HISTORY:  Oncology History   No history exists.    FAMILY HISTORY:  We obtained a detailed, 4-generation family history.  Significant diagnoses are listed below: Family History  Problem Relation Age of Onset  . Cancer Father        Colon  . Coronary artery disease Other   . Heart attack Mother        d. 93  . Breast cancer Paternal Uncle 9  . Dementia Maternal Grandmother   . Leukemia Maternal Uncle 1    The patient has a son and daughter who are cancer free.  She has three sisters and two brothers.  One sister is being worked up for pancreatic cancer.  Both parents are deceased.  The patient's father died of colon cancer at 73.  He had 4 brothers and 2 sisters.  One sister had breast cancer at 35.  The paternal grandparents are deceased from unknown causes.  The patient's mother had cervical cancer.  She had two brothers and a sister.  One brother had leukemia at 66 years old.  The maternal grandparents are deceased from unknown reasons.  Ms. [name] is unaware of previous family history of genetic testing for hereditary cancer risks. Patient's maternal ancestors are of [place] descent, and paternal ancestors are of Caucasian descent. There is no reported Ashkenazi Jewish ancestry. There is no known consanguinity.    GENETIC TEST RESULTS: Genetic testing reported out on December 28, 2019 through the common hereditary cancer panel found no pathogenic mutations. The Common  Hereditary Gene Panel offered by [facility] includes sequencing and/or deletion duplication testing of the following 48 genes: APC, ATM, AXIN2, BARD1, BMPR1A, BRCA1, BRCA2, BRIP1, CDH1, CDK4, CDKN2A (p14ARF), CDKN2A (p16INK4a), CHEK2, CTNNA1, DICER1, EPCAM (Deletion/duplication testing only), GREM1 (promoter region deletion/duplication testing only), KIT, MEN1, MLH1, MSH2, MSH3, MSH6, MUTYH, NBN, NF1, NHTL1, PALB2, PDGFRA, PMS2, POLD1, POLE, PTEN, RAD50, RAD51C, RAD51D, RNF43, SDHB, SDHC, SDHD, SMAD4, SMARCA4. STK11, TP53, TSC1, TSC2, and VHL.  The following genes were evaluated for sequence changes only: SDHA and HOXB13 c.251G>A variant only. The test report has been scanned into EPIC and is located under the Molecular Pathology section of the Results Review tab.  A portion of the result report is included below for reference.     We discussed with Ms. Mastrogiovanni that because current genetic testing is not perfect, it is possible there may be a gene mutation in one of these genes that current testing cannot detect, but that chance is small.  We also discussed, that there could be another gene that has not yet been discovered, or that we have not yet tested, that is responsible for the cancer diagnoses in the family. It is also possible there is a hereditary cause for the cancer in the family that Ms. Monceaux did not inherit and therefore was not identified in her testing.  Therefore, it is important to remain in touch with cancer genetics in the future so that we can continue to offer Ms. Ledesma the most up to date genetic testing.  ADDITIONAL GENETIC TESTING: We discussed with Ms. Stambaugh that there are other genes that are associated with increased cancer risk that can be analyzed. Should Ms. Eckman wish to pursue additional genetic testing, we are happy to discuss and coordinate this testing, at any time.    CANCER SCREENING RECOMMENDATIONS: Ms. Heaton test result is considered negative (normal).  This means  that we have not identified a hereditary cause for her family history of cancer at this time. Most cancers happen by chance and this negative test suggests that her cancer may fall into this category.    While reassuring, this does not definitively rule out a hereditary predisposition to cancer. It is still possible that there could be genetic mutations that are undetectable by current technology. There could be genetic mutations in genes that have not been tested or identified to increase cancer risk.  Therefore, it is recommended she continue to follow the cancer management and screening guidelines provided by her primary healthcare provider.   An individual's cancer risk and medical management are not determined by genetic test results alone. Overall cancer risk assessment incorporates additional factors, including personal medical history, family history, and any available genetic information that may result in a personalized plan for cancer prevention and surveillance  RECOMMENDATIONS FOR FAMILY MEMBERS:  Individuals in this family might be at some increased risk of developing cancer, over the general population risk, simply due to the family history of cancer.  We recommended women in this family have a yearly mammogram beginning at age 17, or 6 years younger than the earliest onset of cancer, an annual clinical breast exam, and perform monthly breast self-exams. Women in this family should also have a gynecological exam as recommended by their primary provider. All family members should have a colonoscopy by age 51.  FOLLOW-UP: Lastly, we discussed with Ms. Godino that cancer genetics is a rapidly advancing field and it is possible that new genetic tests will be appropriate for her and/or her family members in the future. We encouraged her to remain in contact with cancer genetics on an annual basis so we can update her personal and family histories and let her know of advances in cancer genetics  that may benefit this family.   Our contact number was provided. Ms. Melendrez questions were answered to her satisfaction, and she knows she is welcome to call us at anytime with additional questions or concerns.   Roma Kayser, [place], [facility] Licensed, Certified Genetic Counselor [email or web address]

## 2020-01-17 DIAGNOSIS — G4700 Insomnia, unspecified: Secondary | ICD-10-CM | POA: Diagnosis not present

## 2020-01-17 DIAGNOSIS — G894 Chronic pain syndrome: Secondary | ICD-10-CM | POA: Diagnosis not present

## 2020-01-19 ENCOUNTER — Other Ambulatory Visit: Payer: Self-pay | Admitting: Physician Assistant

## 2020-01-23 NOTE — Progress Notes (Signed)
Tried multiple times to contact patient via telephone and sent via [place]. Mailed letter out with comments to patient.

## 2020-01-31 DIAGNOSIS — G609 Hereditary and idiopathic neuropathy, unspecified: Secondary | ICD-10-CM | POA: Diagnosis not present

## 2020-01-31 DIAGNOSIS — G20 Parkinson's disease: Secondary | ICD-10-CM | POA: Diagnosis not present

## 2020-01-31 DIAGNOSIS — M7122 Synovial cyst of popliteal space [Baker], left knee: Secondary | ICD-10-CM | POA: Diagnosis not present

## 2020-02-08 ENCOUNTER — Telehealth: Payer: Self-pay | Admitting: Physician Assistant

## 2020-02-08 NOTE — Telephone Encounter (Signed)
   Pt c/o medication issue:  1. Name of Medication:   varenicline (CHANTIX CONTINUING MONTH PAK) 1 MG tablet    2. How are you currently taking this medication (dosage and times per day)? Take 1 tablet (1 mg total) by mouth 2 (two) times daily.  3. Are you having a reaction (difficulty breathing--STAT)?   4. What is your medication issue? Denise call from [place] she said she's doing a courtesy call about pt's Chantix. She said pt last refill was on 01/23/20 and the next refill it needs to have prior auth

## 2020-02-14 ENCOUNTER — Telehealth: Payer: Self-pay | Admitting: Internal Medicine

## 2020-02-14 NOTE — Telephone Encounter (Signed)
I attempted to contact patient 02/14/20 to schedule follow up visit with Dr. Debara Pickett from patients recall list. The patient didn't answer so I left message for patient to return call in order to get that appointment scheduled.

## 2020-02-15 DIAGNOSIS — R252 Cramp and spasm: Secondary | ICD-10-CM | POA: Diagnosis not present

## 2020-02-15 DIAGNOSIS — M542 Cervicalgia: Secondary | ICD-10-CM | POA: Diagnosis not present

## 2020-02-15 DIAGNOSIS — R251 Tremor, unspecified: Secondary | ICD-10-CM | POA: Diagnosis not present

## 2020-02-15 DIAGNOSIS — M545 Low back pain: Secondary | ICD-10-CM | POA: Diagnosis not present

## 2020-02-18 ENCOUNTER — Telehealth: Payer: Self-pay | Admitting: Internal Medicine

## 2020-02-18 DIAGNOSIS — G894 Chronic pain syndrome: Secondary | ICD-10-CM | POA: Diagnosis not present

## 2020-02-18 NOTE — Telephone Encounter (Signed)
spoke with patient 02/18/20 to schedule follow up visit but patient requested we call back 02/20/20 to get that appointment scheduled.

## 2020-02-21 ENCOUNTER — Other Ambulatory Visit: Payer: Self-pay | Admitting: Physician Assistant

## 2020-02-24 ENCOUNTER — Other Ambulatory Visit: Payer: Self-pay | Admitting: Physician Assistant

## 2020-03-17 DIAGNOSIS — J22 Unspecified acute lower respiratory infection: Secondary | ICD-10-CM | POA: Diagnosis not present

## 2020-03-17 DIAGNOSIS — G894 Chronic pain syndrome: Secondary | ICD-10-CM | POA: Diagnosis not present

## 2020-04-17 ENCOUNTER — Other Ambulatory Visit (HOSPITAL_COMMUNITY): Payer: Self-pay | Admitting: Internal Medicine

## 2020-04-17 DIAGNOSIS — M545 Low back pain, unspecified: Secondary | ICD-10-CM

## 2020-04-17 DIAGNOSIS — Z719 Counseling, unspecified: Secondary | ICD-10-CM | POA: Diagnosis not present

## 2020-04-17 DIAGNOSIS — E663 Overweight: Secondary | ICD-10-CM | POA: Diagnosis not present

## 2020-04-17 DIAGNOSIS — Z6826 Body mass index (BMI) 26.0-26.9, adult: Secondary | ICD-10-CM | POA: Diagnosis not present

## 2020-04-17 DIAGNOSIS — E271 Primary adrenocortical insufficiency: Secondary | ICD-10-CM | POA: Diagnosis not present

## 2020-04-17 DIAGNOSIS — I700 Atherosclerosis of aorta: Secondary | ICD-10-CM | POA: Diagnosis not present

## 2020-04-17 DIAGNOSIS — M5412 Radiculopathy, cervical region: Secondary | ICD-10-CM | POA: Diagnosis not present

## 2020-04-17 DIAGNOSIS — G894 Chronic pain syndrome: Secondary | ICD-10-CM | POA: Diagnosis not present

## 2020-04-23 ENCOUNTER — Ambulatory Visit (HOSPITAL_COMMUNITY)
Admission: RE | Admit: 2020-04-23 | Discharge: 2020-04-23 | Disposition: A | Discharge status: Home / Self Care | Payer: Medicare HMO | Source: Ambulatory Visit | Attending: Internal Medicine | Admitting: Internal Medicine

## 2020-04-23 ENCOUNTER — Other Ambulatory Visit: Payer: Self-pay

## 2020-04-23 DIAGNOSIS — M545 Low back pain, unspecified: Secondary | ICD-10-CM

## 2020-04-23 DIAGNOSIS — M47812 Spondylosis without myelopathy or radiculopathy, cervical region: Secondary | ICD-10-CM | POA: Diagnosis not present

## 2020-04-23 DIAGNOSIS — Z981 Arthrodesis status: Secondary | ICD-10-CM | POA: Diagnosis not present

## 2020-05-16 DIAGNOSIS — G4700 Insomnia, unspecified: Secondary | ICD-10-CM | POA: Diagnosis not present

## 2020-05-16 DIAGNOSIS — G894 Chronic pain syndrome: Secondary | ICD-10-CM | POA: Diagnosis not present

## 2020-05-16 DIAGNOSIS — R69 Illness, unspecified: Secondary | ICD-10-CM | POA: Diagnosis not present

## 2020-05-16 DIAGNOSIS — E063 Autoimmune thyroiditis: Secondary | ICD-10-CM | POA: Diagnosis not present

## 2020-05-24 DIAGNOSIS — M25519 Pain in unspecified shoulder: Secondary | ICD-10-CM | POA: Diagnosis not present

## 2020-05-24 DIAGNOSIS — R52 Pain, unspecified: Secondary | ICD-10-CM | POA: Diagnosis not present

## 2020-06-09 DIAGNOSIS — J329 Chronic sinusitis, unspecified: Secondary | ICD-10-CM | POA: Diagnosis not present

## 2020-06-09 DIAGNOSIS — R69 Illness, unspecified: Secondary | ICD-10-CM | POA: Diagnosis not present

## 2020-06-09 DIAGNOSIS — G894 Chronic pain syndrome: Secondary | ICD-10-CM | POA: Diagnosis not present

## 2020-06-10 ENCOUNTER — Telehealth (INDEPENDENT_AMBULATORY_CARE_PROVIDER_SITE_OTHER): Payer: Medicare HMO | Admitting: Physician Assistant

## 2020-06-10 ENCOUNTER — Encounter: Payer: Self-pay | Admitting: Physician Assistant

## 2020-06-10 ENCOUNTER — Telehealth: Payer: Self-pay

## 2020-06-10 DIAGNOSIS — R079 Chest pain, unspecified: Secondary | ICD-10-CM

## 2020-06-10 DIAGNOSIS — Z720 Tobacco use: Secondary | ICD-10-CM | POA: Diagnosis not present

## 2020-06-10 DIAGNOSIS — E785 Hyperlipidemia, unspecified: Secondary | ICD-10-CM | POA: Diagnosis not present

## 2020-06-10 DIAGNOSIS — I10 Essential (primary) hypertension: Secondary | ICD-10-CM | POA: Diagnosis not present

## 2020-06-10 MED ORDER — VARENICLINE TARTRATE 1 MG PO TABS: 1.0000 mg | ORAL_TABLET | Freq: Two times a day (BID) | ORAL | 1 refills | Status: DC

## 2020-06-10 NOTE — Patient Instructions (Signed)
Medication Instructions:  No changes. We have refilled your Chantix. *If you need a refill on your cardiac medications before your next appointment, please call your pharmacy*   Lab Work: None ordered If you have labs (blood work) drawn today and your tests are completely normal, you will receive your results only by: Marland Kitchen MyChart Message (if you have MyChart) OR . A paper copy in the mail If you have any lab test that is abnormal or we need to change your treatment, we will call you to review the results.   Testing/Procedures: None ordered   Follow-Up: At [facility], you and your health needs are our priority.  As part of our continuing mission to provide you with exceptional heart care, we have created designated Provider Care Teams.  These Care Teams include your primary Cardiologist (physician) and Advanced Practice Providers (APPs -  Physician Assistants and Nurse Practitioners) who all work together to provide you with the care you need, when you need it.  We recommend signing up for the patient portal called "MyChart".  Sign up information is provided on this After Visit Summary.  MyChart is used to connect with patients for Virtual Visits (Telemedicine).  Patients are able to view lab/test results, encounter notes, upcoming appointments, etc.  Non-urgent messages can be sent to your provider as well.   To learn more about what you can do with MyChart, go to [email or web address].    Your next appointment:   6 month(s)  The format for your next appointment:   In Person  Provider:   K. Mali Hilty, MD   Other Instructions None

## 2020-06-10 NOTE — Telephone Encounter (Signed)
°  Patient Consent for Virtual Visit         [name] has provided verbal consent on 06/10/2020 for a virtual visit (video or telephone).   CONSENT FOR VIRTUAL VISIT FOR:  [name]  By participating in this virtual visit I agree to the following:  I hereby voluntarily request, consent and authorize [place] and its employed or contracted physicians, physician assistants, nurse practitioners or other licensed health care professionals (the Practitioner), to provide me with telemedicine health care services (the Services") as deemed necessary by the treating Practitioner. I acknowledge and consent to receive the Services by the Practitioner via telemedicine. I understand that the telemedicine visit will involve communicating with the Practitioner through live audiovisual communication technology and the disclosure of certain medical information by electronic transmission. I acknowledge that I have been given the opportunity to request an in-person assessment or other available alternative prior to the telemedicine visit and am voluntarily participating in the telemedicine visit.  I understand that I have the right to withhold or withdraw my consent to the use of telemedicine in the course of my care at any time, without affecting my right to future care or treatment, and that the Practitioner or I may terminate the telemedicine visit at any time. I understand that I have the right to inspect all information obtained and/or recorded in the course of the telemedicine visit and may receive copies of available information for a reasonable fee.  I understand that some of the potential risks of receiving the Services via telemedicine include:   Delay or interruption in medical evaluation due to technological equipment failure or disruption;  Information transmitted may not be sufficient (e.g. poor resolution of images) to allow for appropriate medical decision making by the Practitioner;  and/or   In rare instances, security protocols could fail, causing a breach of personal health information.  Furthermore, I acknowledge that it is my responsibility to provide information about my medical history, conditions and care that is complete and accurate to the best of my ability. I acknowledge that Practitioner's advice, recommendations, and/or decision may be based on factors not within their control, such as incomplete or inaccurate data provided by me or distortions of diagnostic images or specimens that may result from electronic transmissions. I understand that the practice of medicine is not an exact science and that Practitioner makes no warranties or guarantees regarding treatment outcomes. I acknowledge that a copy of this consent can be made available to me via my patient portal ([place]), or I can request a printed copy by calling the office of [place].    I understand that my insurance will be billed for this visit.   I have read or had this consent read to me.  I understand the contents of this consent, which adequately explains the benefits and risks of the Services being provided via telemedicine.   I have been provided ample opportunity to ask questions regarding this consent and the Services and have had my questions answered to my satisfaction.  I give my informed consent for the services to be provided through the use of telemedicine in my medical care

## 2020-06-10 NOTE — Progress Notes (Signed)
Virtual Visit via Telephone Note   This visit type was conducted due to national recommendations for restrictions regarding the COVID-19 Pandemic (e.g. social distancing) in an effort to limit this patient's exposure and mitigate transmission in our community.  Due to her co-morbid illnesses, this patient is at least at moderate risk for complications without adequate follow up.  This format is felt to be most appropriate for this patient at this time.  The patient did not have access to video technology/had technical difficulties with video requiring transitioning to audio format only (telephone).  All issues noted in this document were discussed and addressed.  No physical exam could be performed with this format.  Please refer to the patient's chart for her  consent to telehealth for [facility].    Date:  06/12/2020   ID:  [name], DOB November 16, 1965, MRN [id number] The patient was identified using 2 identifiers.  Patient Location: Home Provider Location: Office/Clinic  PCP:  Redmond School, MD  Cardiologist:  Pixie Casino, MD  Electrophysiologist:  None   Evaluation Performed:  Follow-Up Visit  Chief Complaint:  Follow up  History of Present Illness:    [name] is a 54 y.o. female with  hx of HTN, HLD,andtobacco abuse.In 2013, she was evaluated for chest pain with left heart cath which showed preserved LV function and no significant obstructive coronary artery disease. Previously has been reported there was a 50-60% stenosis in mid RCA which was noted to be completely resolved in 2013 favoring diagnosis of coronary spasm. She has a history of Addison's disease, hypothyroidism, prolonged QT interval and remote history of nonischemic cardiomyopathy with EF 40-45% which later normalized.Last echocardiogram obtained on 05/26/2015 showed EF 55 to 60%, grade 1 DD.She was admitted in December 2020 with pleuritic chest pain, headache, dizziness and a fever with T-max  102.9.She was exposed to relatives who have tested positive for COVID-19, although her own COVID-19 test was negative. She also had intractable emesis and AKI that resolved with antiemetic and hydration. She was seen in the ED again February 2021 with bilateral flank pain that worse with movement. CT scan was negative for acute finding. She was treated for muscle spasm. EKG at the time showed normal sinus rhythm without significant ST-T wave changes.  When I saw the patient back in April 2021, she was having some intermittent chest pain.  Myoview obtained on 01/08/2020 showed EF 59%, medium defect of moderate severity present in the mid anterior and apical anterior location likely shifting breast attenuation artifact, overall low risk study.  Patient was seen today via virtual visit.  She denies any recent chest pain or shortness of breath.  She just finished the starting pack for the Chantix last month.  For several month, she did not take Chantix due to a recall for the medication.  She has resumed the maintenance dose for the medication last month.  She does need a refill for the Chantix as well.  Currently she is smoking about 7 cigarettes/day and is trying to cut down further.  Overall, I think she is doing well from cardiac perspective and can follow-up in 6 months.  The patient does not have symptoms concerning for COVID-19 infection (fever, chills, cough, or new shortness of breath).    Past Medical History:  Diagnosis Date  . Addison's disease ([place])   . Adrenal insufficiency ([place])    diagnosed 2012  . Aneurysm ([place])   . Anxiety   . Arthritis   .  Astigmatism   . CAD (coronary artery disease)    Cath 2008 EF normal. RCA 50-60, Septal 50%. Myoview 3/12: EF 53% normal perfusion  . Cardiac arrest ([place])    2/2 adissonian crisis  . Cardiomyopathy    resolved  . Chest pain    chronicc  . CHF (congestive heart failure) ([place])   . Chronic back pain   . Chronic diarrhea   . Concussion     sept 28th 2014  . Family history of breast cancer   . Family history of colon cancer   . Gastroparesis   . GERD (gastroesophageal reflux disease)   . HTN (hypertension)   . Hyperlipidemia   . Hypothyroidism   . Mitral valve prolapse   . Nondiabetic gastroparesis   . PONV (postoperative nausea and vomiting)   . QT prolongation   . Tobacco abuse    down to 2 cigarettes per day  . Vertigo    Past Surgical History:  Procedure Laterality Date  . ABDOMINAL HYSTERECTOMY    . CARDIAC CATHETERIZATION  03/2007   showed 60% lesion in the right coronary artery  . CHOLECYSTECTOMY    . LEFT HEART CATHETERIZATION WITH CORONARY ANGIOGRAM N/A 04/13/2012   Procedure: LEFT HEART CATHETERIZATION WITH CORONARY ANGIOGRAM;  Surgeon: Hillary Bow, MD;  Location: [facility] CATH LAB;  Service: Cardiovascular;  Laterality: N/A;  . LUMBAR LAMINECTOMY/DECOMPRESSION MICRODISCECTOMY Left 12/20/2018   Procedure: Left Lumbar Three-Four Lumbar Laminotomy and Foraminotomy, Lumbar Four-Five Laminotomy and Foraminotomy with Microdiscectomy and Resection of Synovial Cyst;  Surgeon: Jovita Gamma, MD;  Location: [facility];  Service: Neurosurgery;  Laterality: Left;  Left Lumbar 3-4 Lumbar laminotomy, foraminotomy, possible microdiscectomy with possible resection of synovial cyst  . SPINE SURGERY    . VARICOSE VEIN SURGERY    . VESICOVAGINAL FISTULA CLOSURE W/ TAH       Current Meds  Medication Sig  . albuterol (VENTOLIN HFA) 108 (90 Base) MCG/ACT inhaler Inhale 1-2 puffs into the lungs every 6 (six) hours as needed for wheezing or shortness of breath.   Marland Kitchen amLODipine (NORVASC) 10 MG tablet Take 10 mg by mouth daily.   Marland Kitchen aspirin 81 MG chewable tablet Chew 81 mg by mouth daily.   . cholecalciferol (VITAMIN D) 1000 UNITS tablet Take 1,000 Units by mouth daily.  . cyclobenzaprine (FLEXERIL) 10 MG tablet Take 10 mg by mouth 3 (three) times daily as needed for muscle spasms.   Marland Kitchen gabapentin (NEURONTIN) 600 MG tablet Take 600 mg  by mouth 4 (four) times daily.  . hydrocortisone (CORTEF) 10 MG tablet Take 10-20 mg by mouth See admin instructions. Takes 20 mg in the morning and 10 mg at night  . levofloxacin (LEVAQUIN) 500 MG tablet Take 500 mg by mouth daily. For sinus infection  . levothyroxine (SYNTHROID, LEVOTHROID) 75 MCG tablet Take 75 mcg by mouth daily before breakfast.  . lidocaine (LIDODERM) 5 % Place 1 patch onto the skin daily. Remove & Discard patch within 12 hours or as directed by MD  . meclizine (ANTIVERT) 25 MG tablet 1 or 2 tabs PO q8h prn dizziness (Patient taking differently: Take 25-50 mg by mouth 3 (three) times daily as needed (for vertigo). )  . methylPREDNISolone (MEDROL DOSEPAK) 4 MG TBPK tablet Take 4 mg by mouth. Sinus infection  . nitroGLYCERIN (NITROSTAT) 0.4 MG SL tablet Place 1 tablet (0.4 mg total) under the tongue every 5 (five) minutes as needed for chest pain.  Marland Kitchen ondansetron (ZOFRAN ODT) 4 MG disintegrating tablet Take  1 tablet (4 mg total) by mouth every 8 (eight) hours as needed for nausea.  Marland Kitchen oxyCODONE (ROXICODONE) 15 MG immediate release tablet Take 15 mg by mouth every 4 (four) hours as needed for pain.   . polyethylene glycol (MIRALAX / GLYCOLAX) 17 g packet Take 17 g by mouth daily. (Patient taking differently: Take 17 g by mouth as needed. )  . senna-docusate (SENOKOT-S) 8.6-50 MG tablet Take 2 tablets by mouth at bedtime. (Patient taking differently: Take 1 tablet by mouth at bedtime as needed. )  . varenicline (CHANTIX CONTINUING MONTH PAK) 1 MG tablet Take 1 tablet (1 mg total) by mouth 2 (two) times daily.  Marland Kitchen zolpidem (AMBIEN) 10 MG tablet Take 10 mg by mouth at bedtime.  . [DISCONTINUED] neomycin-polymyxin b-dexamethasone (MAXITROL) 3.5-10000-0.1 SUSP Place 1 drop into the left eye in the morning, at noon, and at bedtime.  . [DISCONTINUED] varenicline (CHANTIX CONTINUING MONTH PAK) 1 MG tablet Take 1 tablet (1 mg total) by mouth 2 (two) times daily.  . [DISCONTINUED] varenicline  (CHANTIX STARTING MONTH PAK) 0.5 MG X 11 & 1 MG X 42 tablet Take one 0.5 mg tablet by mouth once daily for 3 days, then increase to one 0.5 mg tablet twice daily for 4 days, then increase to one 1 mg tablet twice daily.     Allergies:   Bee venom, Doxycycline, Erythromycin, Ibuprofen, Penicillins, Sulfa antibiotics, Cat hair extract, Dust mite extract, Tramadol, and Morphine and related   Social History   Tobacco Use  . Smoking status: Current Every Day Smoker    Packs/day: 0.25    Years: 10.00    Pack years: 2.50    Types: Cigarettes  . Smokeless tobacco: Never Used  . Tobacco comment: smokes 2 a day  Vaping Use  . Vaping Use: Former  Substance Use Topics  . Alcohol use: No  . Drug use: No    Comment: Hisrory of Cocaine use      Family Hx: The patient's family history includes Breast cancer (age of onset: 47) in her paternal uncle; Cancer in her father; Coronary artery disease in an other family member; Dementia in her maternal grandmother; Heart attack in her mother; Leukemia (age of onset: 28) in her maternal uncle.  ROS:   Please see the history of present illness.     All other systems reviewed and are negative.   Prior CV studies:   The following studies were reviewed today:  Myoview 01/08/2020  Nuclear stress EF: 59%.  The left ventricular ejection fraction is normal (55-65%).  There was no ST segment deviation noted during stress.  Defect 1: There is a medium defect of moderate severity present in the mid anterior and apical anterior location.  This is a low risk study.   Mildly abnormal, low risk stress nuclear study with probable shifting breast attenuation.  Cannot rule out very mild anterior ischemia.  Gated ejection fraction 59% with normal wall motion.   Labs/Other Tests and Data Reviewed:    EKG:  An ECG dated 01/11/2020 was personally reviewed today and demonstrated:  Sinus rhythm without significant ST-T wave changes.  Recent Labs: 08/05/2019: B  Natriuretic Peptide 59.0 01/11/2020: ALT 13; BUN 21; Creatinine, Ser 1.08; Hemoglobin 14.0; Magnesium 1.6; Platelets 356; Potassium 3.1; Sodium 138   Recent Lipid Panel Lab Results  Component Value Date/Time   CHOL 183 01/01/2009 01:03 PM   TRIG 112.0 01/01/2009 01:03 PM   HDL 31.20 (L) 01/01/2009 01:03 PM   CHOLHDL 6  01/01/2009 01:03 PM   LDLCALC 129 (H) 01/01/2009 01:03 PM    Wt Readings from Last 3 Encounters:  01/11/20 180 lb (81.6 kg)  01/08/20 177 lb (80.3 kg)  12/20/19 177 lb (80.3 kg)     Objective:    Vital Signs:  There were no vitals taken for this visit.   VITAL SIGNS:  reviewed  ASSESSMENT & PLAN:    1. History of chest pain: Previous cardiac catheterization in 2013 showed no significant obstructive disease.  Last Myoview obtained in May 2021 was low risk.  She has not had any further chest discomfort since the last visit.  2. Hypertension: Continue the current therapy  3. Hyperlipidemia: will defer annual lipid panel to PCP.  4. Tobacco abuse: will prescribe Chantix.  COVID-19 Education: The signs and symptoms of COVID-19 were discussed with the patient and how to seek care for testing (follow up with PCP or arrange E-visit).  The importance of social distancing was discussed today.  Time:   Today, I have spent 6 minutes with the patient with telehealth technology discussing the above problems.     Medication Adjustments/Labs and Tests Ordered: Current medicines are reviewed at length with the patient today.  Concerns regarding medicines are outlined above.   Tests Ordered: No orders of the defined types were placed in this encounter.   Medication Changes: Meds ordered this encounter  Medications  . varenicline (CHANTIX CONTINUING MONTH PAK) 1 MG tablet    Sig: Take 1 tablet (1 mg total) by mouth 2 (two) times daily.    Dispense:  60 tablet    Refill:  1    Follow Up:  In Person in 6 month(s)  Signed, [name], [place]  06/12/2020 9:04 PM    [facility]

## 2020-06-10 NOTE — Telephone Encounter (Signed)
Called patient to discuss AVS from virtual visit with [name], PA-C on 06/10/20. Left message to call back.

## 2020-06-19 DIAGNOSIS — R251 Tremor, unspecified: Secondary | ICD-10-CM | POA: Diagnosis not present

## 2020-06-19 DIAGNOSIS — M542 Cervicalgia: Secondary | ICD-10-CM | POA: Diagnosis not present

## 2020-06-19 DIAGNOSIS — M545 Low back pain, unspecified: Secondary | ICD-10-CM | POA: Diagnosis not present

## 2020-06-19 DIAGNOSIS — M5416 Radiculopathy, lumbar region: Secondary | ICD-10-CM | POA: Diagnosis not present

## 2020-06-19 DIAGNOSIS — R252 Cramp and spasm: Secondary | ICD-10-CM | POA: Diagnosis not present

## 2020-07-03 DIAGNOSIS — M5410 Radiculopathy, site unspecified: Secondary | ICD-10-CM | POA: Diagnosis not present

## 2020-07-03 DIAGNOSIS — E063 Autoimmune thyroiditis: Secondary | ICD-10-CM | POA: Diagnosis not present

## 2020-07-03 DIAGNOSIS — G8929 Other chronic pain: Secondary | ICD-10-CM | POA: Diagnosis not present

## 2020-07-03 DIAGNOSIS — M5136 Other intervertebral disc degeneration, lumbar region: Secondary | ICD-10-CM | POA: Diagnosis not present

## 2020-07-03 DIAGNOSIS — M47816 Spondylosis without myelopathy or radiculopathy, lumbar region: Secondary | ICD-10-CM | POA: Diagnosis not present

## 2020-07-03 DIAGNOSIS — Z6828 Body mass index (BMI) 28.0-28.9, adult: Secondary | ICD-10-CM | POA: Diagnosis not present

## 2020-07-03 DIAGNOSIS — E271 Primary adrenocortical insufficiency: Secondary | ICD-10-CM | POA: Diagnosis not present

## 2020-07-03 DIAGNOSIS — M62830 Muscle spasm of back: Secondary | ICD-10-CM | POA: Diagnosis not present

## 2020-07-03 DIAGNOSIS — Z23 Encounter for immunization: Secondary | ICD-10-CM | POA: Diagnosis not present

## 2020-07-04 DIAGNOSIS — M5136 Other intervertebral disc degeneration, lumbar region: Secondary | ICD-10-CM | POA: Diagnosis not present

## 2020-07-04 IMAGING — CT CT ABD-PELV W/ CM
2 of 4 series · 16 of 46 positions shown, 18 images · IV contrast (Omnipaque or Isovue)
Comparison: CT abdomen pelvis dated 10/19/2018.

CLINICAL DATA: 53-year-old female with abdominal pain. Bilateral
flank pain and dysuria.

EXAM:
CT ABDOMEN AND PELVIS WITH CONTRAST
TECHNIQUE: Multidetector CT imaging of the abdomen and pelvis was performed
using the standard protocol following bolus administration of
intravenous contrast.
CONTRAST:  100mL OMNIPAQUE IOHEXOL 300 MG/ML  SOLN

[Series 2: axial st · axial · 0.89mm/px · z∈[+937,+1367]mm · 13 of 94 slices shown, 15 images]
[im 4/94  soft-tissue]
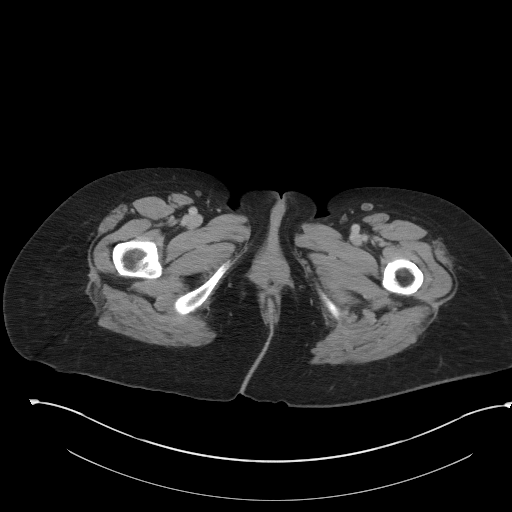
[im 4/94  bone]
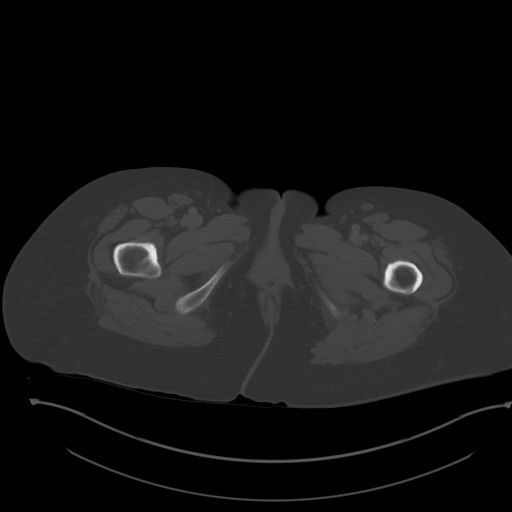
[im 12/94  soft-tissue]
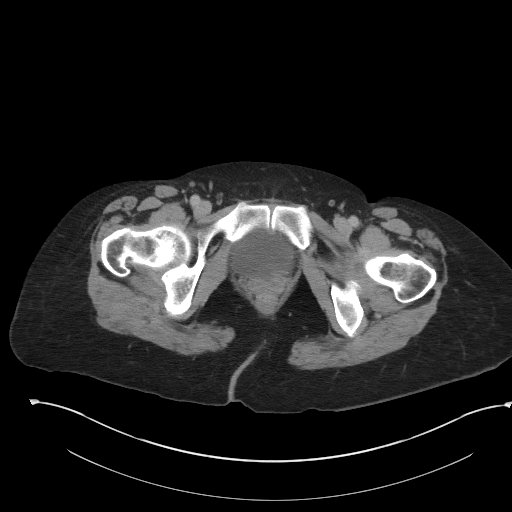
[im 20/94  soft-tissue]
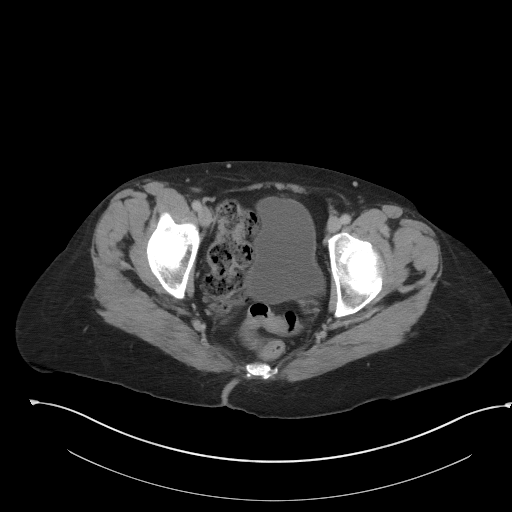
[im 28/94  soft-tissue]
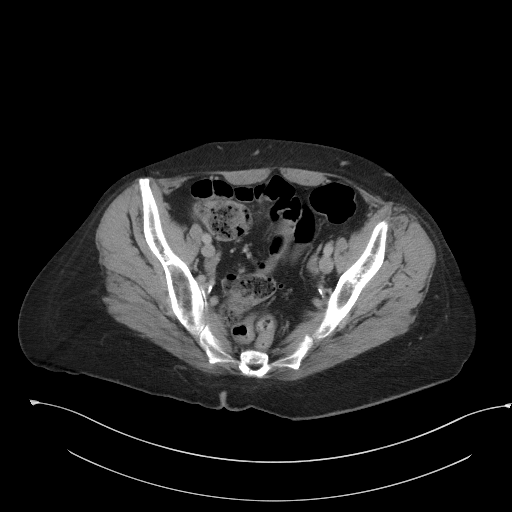
[im 32/94  soft-tissue]
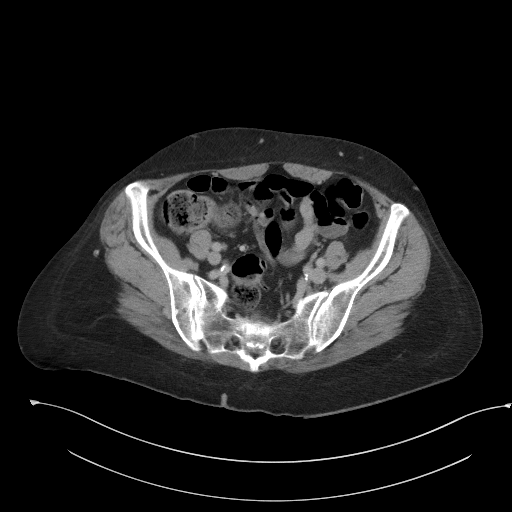
[im 39/94  soft-tissue]
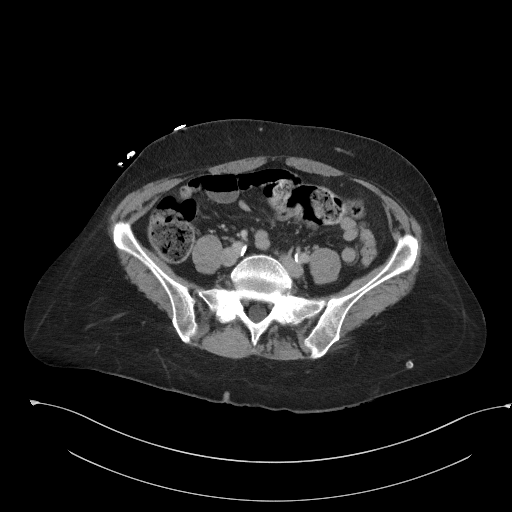
[im 47/94  soft-tissue]
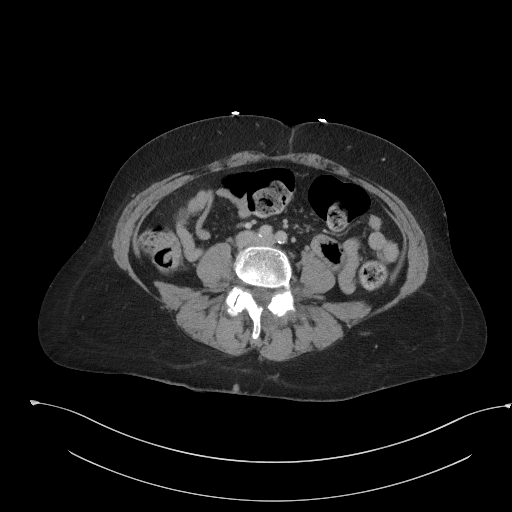
[im 55/94  soft-tissue]
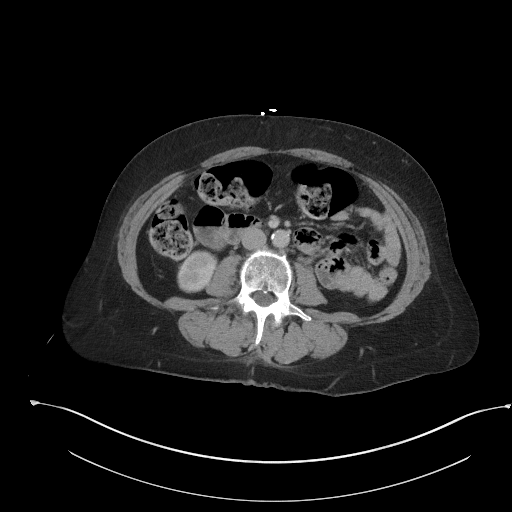
[im 63/94  soft-tissue]
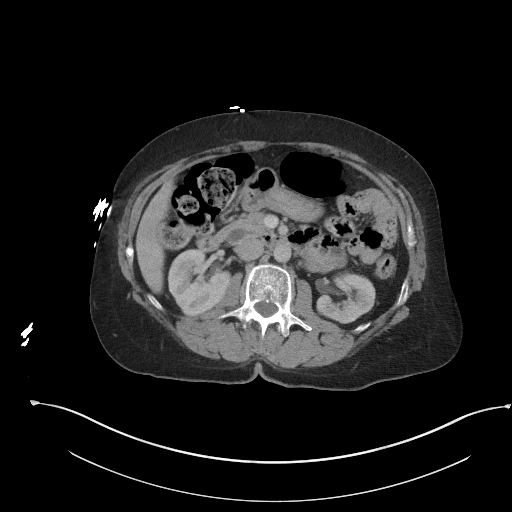
[im 63/94  bone]
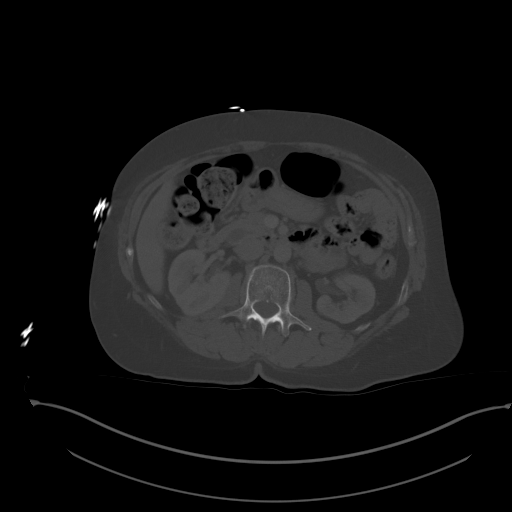
[im 66/94  soft-tissue]
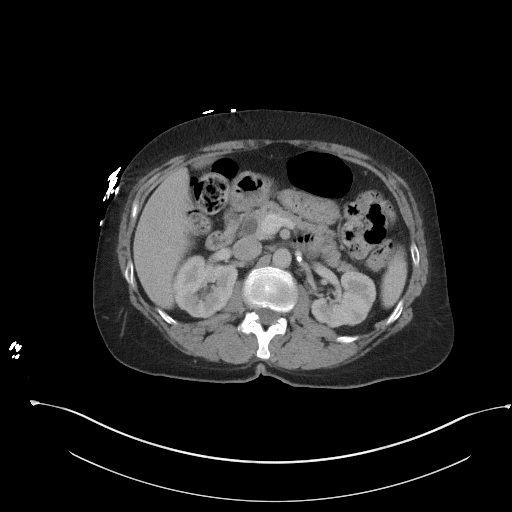
[im 74/94  soft-tissue]
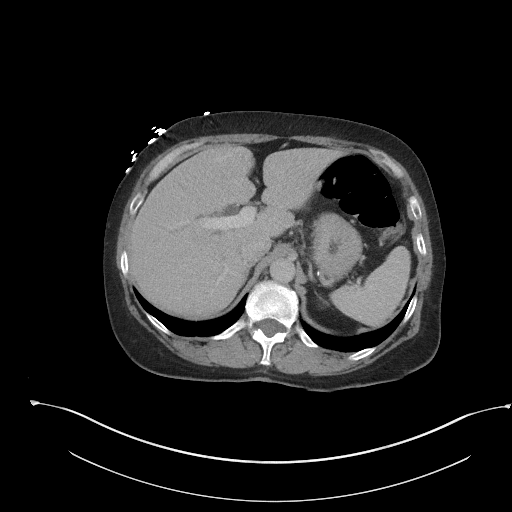
[im 82/94  soft-tissue]
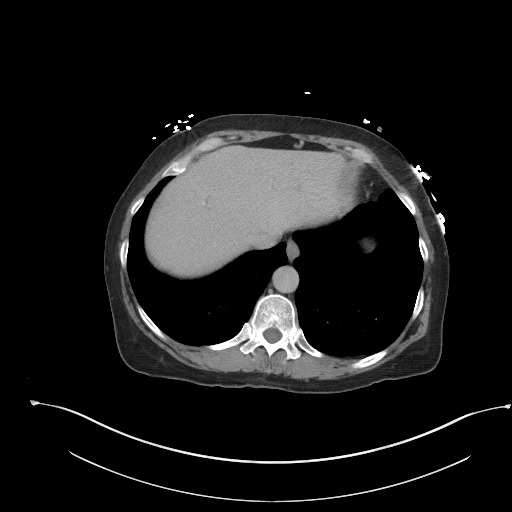
[im 90/94  soft-tissue]
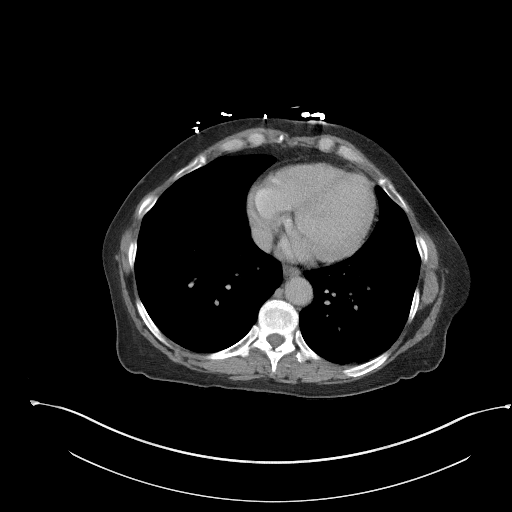

[Series 5: coronal st · coronal · 0.82mm/px · 3 of 86 slices shown]
[im 29/86  soft-tissue]
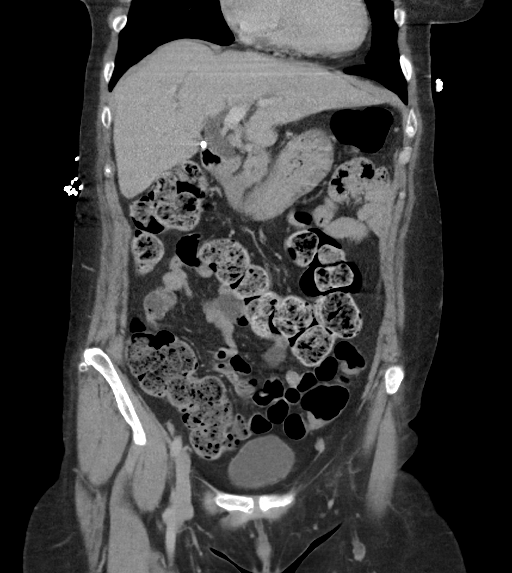
[im 38/86  soft-tissue]
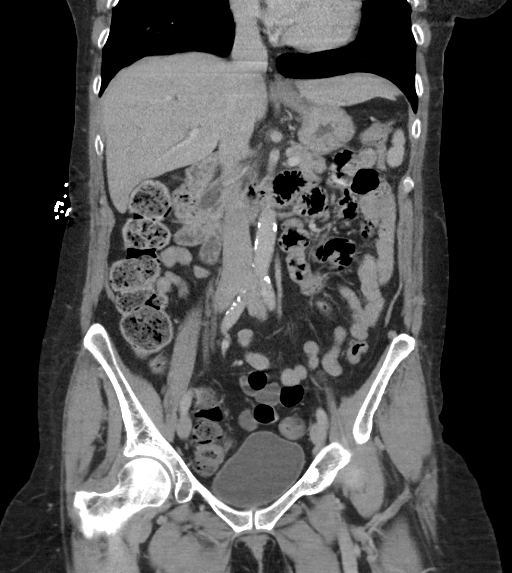
[im 48/86  soft-tissue]
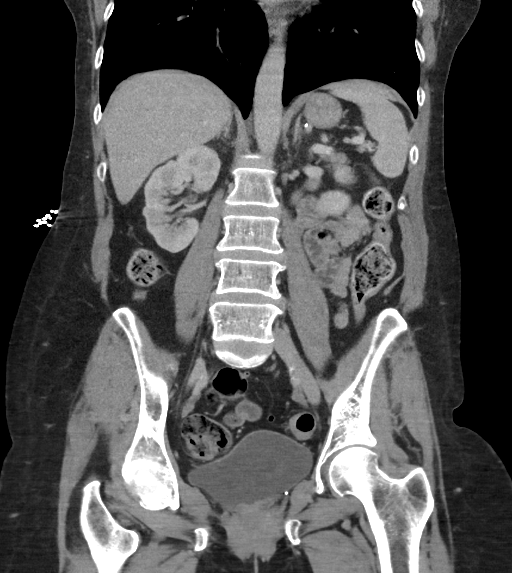

[16 of 46 positions shown; findings below may reference images not displayed]

FINDINGS: Lower chest: Minimal left lung base dependent atelectasis. The
visualized lung bases are otherwise clear.

No intra-abdominal free air or free fluid.

Hepatobiliary: The liver is unremarkable. There is mild intrahepatic
biliary ductal dilatation. The common bile duct measures up to 15 mm
in diameter. Status post prior cholecystectomy. No retained
calcified stone noted in the central CBD.

Pancreas: Unremarkable. No pancreatic ductal dilatation or
surrounding inflammatory changes.

Spleen: Normal in size without focal abnormality.

Adrenals/Urinary Tract: The adrenal glands are unremarkable. There
is no hydronephrosis on either side. There is symmetric enhancement
and excretion of contrast by both kidneys. The visualized ureters
and urinary bladder appear unremarkable.

Stomach/Bowel: There is moderate stool throughout the colon. There
is no bowel obstruction or active inflammation. The appendix is
normal.

Vascular/Lymphatic: Mild aortoiliac atherosclerotic disease. The IVC
is unremarkable. No portal venous gas. There is no adenopathy.

Reproductive: Hysterectomy.  No adnexal masses.

Other: Small fat containing umbilical hernia.

Musculoskeletal: Multilevel facet arthropathy. No acute osseous
pathology.
IMPRESSION: 1. No acute intra-abdominal or pelvic pathology.  No hydronephrosis.
2. Moderate colonic stool burden. No bowel obstruction or active
inflammation. Normal appendix.

## 2020-07-05 DIAGNOSIS — E039 Hypothyroidism, unspecified: Secondary | ICD-10-CM | POA: Diagnosis not present

## 2020-07-05 DIAGNOSIS — R52 Pain, unspecified: Secondary | ICD-10-CM | POA: Diagnosis not present

## 2020-07-05 DIAGNOSIS — I10 Essential (primary) hypertension: Secondary | ICD-10-CM | POA: Diagnosis not present

## 2020-07-05 DIAGNOSIS — M5459 Other low back pain: Secondary | ICD-10-CM | POA: Diagnosis not present

## 2020-07-05 DIAGNOSIS — F17210 Nicotine dependence, cigarettes, uncomplicated: Secondary | ICD-10-CM | POA: Insufficient documentation

## 2020-07-05 DIAGNOSIS — Z79890 Hormone replacement therapy (postmenopausal): Secondary | ICD-10-CM | POA: Insufficient documentation

## 2020-07-05 DIAGNOSIS — I509 Heart failure, unspecified: Secondary | ICD-10-CM | POA: Insufficient documentation

## 2020-07-05 DIAGNOSIS — I2510 Atherosclerotic heart disease of native coronary artery without angina pectoris: Secondary | ICD-10-CM | POA: Diagnosis not present

## 2020-07-05 DIAGNOSIS — M549 Dorsalgia, unspecified: Secondary | ICD-10-CM | POA: Diagnosis not present

## 2020-07-05 DIAGNOSIS — Z955 Presence of coronary angioplasty implant and graft: Secondary | ICD-10-CM | POA: Insufficient documentation

## 2020-07-05 DIAGNOSIS — R69 Illness, unspecified: Secondary | ICD-10-CM | POA: Diagnosis not present

## 2020-07-05 DIAGNOSIS — Z7982 Long term (current) use of aspirin: Secondary | ICD-10-CM | POA: Insufficient documentation

## 2020-07-05 DIAGNOSIS — M545 Low back pain, unspecified: Secondary | ICD-10-CM | POA: Diagnosis not present

## 2020-07-05 DIAGNOSIS — Z79899 Other long term (current) drug therapy: Secondary | ICD-10-CM | POA: Insufficient documentation

## 2020-07-05 DIAGNOSIS — G8929 Other chronic pain: Secondary | ICD-10-CM | POA: Insufficient documentation

## 2020-07-05 DIAGNOSIS — I110 Hypertensive heart disease with heart failure: Secondary | ICD-10-CM | POA: Diagnosis not present

## 2020-07-05 DIAGNOSIS — I5031 Acute diastolic (congestive) heart failure: Secondary | ICD-10-CM | POA: Insufficient documentation

## 2020-07-06 ENCOUNTER — Encounter (HOSPITAL_COMMUNITY): Payer: Self-pay | Admitting: Emergency Medicine

## 2020-07-06 ENCOUNTER — Emergency Department (HOSPITAL_COMMUNITY): Payer: Medicare HMO

## 2020-07-06 ENCOUNTER — Emergency Department (HOSPITAL_COMMUNITY)
Admission: EM | Admit: 2020-07-06 | Discharge: 2020-07-06 | Disposition: A | Discharge status: Home / Self Care | Payer: Medicare HMO | Attending: Emergency Medicine | Admitting: Emergency Medicine

## 2020-07-06 ENCOUNTER — Emergency Department (HOSPITAL_COMMUNITY)
Admission: EM | Admit: 2020-07-06 | Discharge: 2020-07-06 | Disposition: A | Discharge status: Home / Self Care | Payer: Medicare HMO | Source: Home / Self Care | Attending: Emergency Medicine | Admitting: Emergency Medicine

## 2020-07-06 ENCOUNTER — Other Ambulatory Visit: Payer: Self-pay

## 2020-07-06 DIAGNOSIS — F17210 Nicotine dependence, cigarettes, uncomplicated: Secondary | ICD-10-CM | POA: Insufficient documentation

## 2020-07-06 DIAGNOSIS — R52 Pain, unspecified: Secondary | ICD-10-CM | POA: Diagnosis not present

## 2020-07-06 DIAGNOSIS — Z79899 Other long term (current) drug therapy: Secondary | ICD-10-CM | POA: Insufficient documentation

## 2020-07-06 DIAGNOSIS — I2510 Atherosclerotic heart disease of native coronary artery without angina pectoris: Secondary | ICD-10-CM | POA: Insufficient documentation

## 2020-07-06 DIAGNOSIS — E039 Hypothyroidism, unspecified: Secondary | ICD-10-CM | POA: Insufficient documentation

## 2020-07-06 DIAGNOSIS — G8929 Other chronic pain: Secondary | ICD-10-CM | POA: Insufficient documentation

## 2020-07-06 DIAGNOSIS — M545 Low back pain, unspecified: Secondary | ICD-10-CM

## 2020-07-06 DIAGNOSIS — I110 Hypertensive heart disease with heart failure: Secondary | ICD-10-CM | POA: Insufficient documentation

## 2020-07-06 DIAGNOSIS — I5031 Acute diastolic (congestive) heart failure: Secondary | ICD-10-CM | POA: Insufficient documentation

## 2020-07-06 DIAGNOSIS — Z7982 Long term (current) use of aspirin: Secondary | ICD-10-CM | POA: Insufficient documentation

## 2020-07-06 DIAGNOSIS — I10 Essential (primary) hypertension: Secondary | ICD-10-CM | POA: Diagnosis not present

## 2020-07-06 DIAGNOSIS — E161 Other hypoglycemia: Secondary | ICD-10-CM | POA: Diagnosis not present

## 2020-07-06 DIAGNOSIS — E162 Hypoglycemia, unspecified: Secondary | ICD-10-CM | POA: Diagnosis not present

## 2020-07-06 DIAGNOSIS — M5459 Other low back pain: Secondary | ICD-10-CM | POA: Diagnosis not present

## 2020-07-06 DIAGNOSIS — M549 Dorsalgia, unspecified: Secondary | ICD-10-CM | POA: Diagnosis not present

## 2020-07-06 DIAGNOSIS — W19XXXA Unspecified fall, initial encounter: Secondary | ICD-10-CM | POA: Diagnosis not present

## 2020-07-06 MED ORDER — DEXAMETHASONE SODIUM PHOSPHATE 10 MG/ML IJ SOLN
10.0000 mg | Freq: Once | INTRAMUSCULAR | Status: AC
  Administered 2020-07-06: 10 mg via INTRAMUSCULAR
  Filled 2020-07-06: qty 1

## 2020-07-06 MED ORDER — KETOROLAC TROMETHAMINE 30 MG/ML IJ SOLN
30.0000 mg | Freq: Once | INTRAMUSCULAR | Status: AC
  Administered 2020-07-06: 30 mg via INTRAMUSCULAR
  Filled 2020-07-06: qty 1

## 2020-07-06 MED ORDER — FENTANYL CITRATE (PF) 100 MCG/2ML IJ SOLN
50.0000 ug | Freq: Once | INTRAMUSCULAR | Status: AC
  Administered 2020-07-06: 50 ug via INTRAMUSCULAR
  Filled 2020-07-06: qty 2

## 2020-07-06 MED ORDER — LIDOCAINE 5 % EX PTCH: 1.0000 | MEDICATED_PATCH | CUTANEOUS | 0 refills | Status: DC

## 2020-07-06 MED ORDER — DIAZEPAM 5 MG PO TABS
5.0000 mg | ORAL_TABLET | Freq: Once | ORAL | Status: AC
  Administered 2020-07-06: 5 mg via ORAL
  Filled 2020-07-06: qty 1

## 2020-07-06 MED ORDER — PREDNISONE 20 MG PO TABS
ORAL_TABLET | ORAL | 0 refills | Status: DC
Start: 2020-07-06 — End: 2020-09-17

## 2020-07-06 NOTE — Discharge Instructions (Addendum)
The x-ray of your lower back today did not show evidence of any broken bones or misalignment of your spine.  Continue taking your pain medication as directed.  You may try using the lidocaine patches applied to the affected area of your back.  Call your primary care provider tomorrow to arrange a follow-up appointment.

## 2020-07-06 NOTE — ED Provider Notes (Addendum)
[facility] EMERGENCY DEPARTMENT Provider Note   CSN: [id number] Arrival date & time: 07/05/20  2358   Time seen 1:30 AM  History Chief Complaint  Patient presents with  . Back Pain    [name] is a 54 y.o. female.  HPI Patient told triage her chronic back pain has been hurting more over the past 2 weeks, however she told me it started 3 days ago.  She indicates it is in her lower lumbar back on the left side and goes into her left buttock and down her left leg to the level of her ankle.  She states she feels like her leg is going to explode.  She denies any urinary or fecal incontinence.  She states she was seen on Thursday, October 28 by her primary care doctor and she was switched from [facility] to [facility] for her back pain.  She states her oxycodone 15 mg tablets are not touching her pain.  She reports she had surgery last year in April by Dr. Sherwood Gambler and had a laminectomy and a 7 mm synovial cyst removed.  She states her back pain she was having at that time improved.  However for the past year she has been having the pain that she is describing to me now with pain that goes down her leg off and on.  PCP Redmond [employer], MD     Past Medical History:  Diagnosis Date  . Addison's disease ([place])   . Adrenal insufficiency ([place])    diagnosed 2012  . Aneurysm ([place])   . Anxiety   . Arthritis   . Astigmatism   . CAD (coronary artery disease)    Cath 2008 EF normal. RCA 50-60, Septal 50%. Myoview 3/12: EF 53% normal perfusion  . Cardiac arrest ([place])    2/2 adissonian crisis  . Cardiomyopathy    resolved  . Chest pain    chronicc  . CHF (congestive heart failure) ([place])   . Chronic back pain   . Chronic diarrhea   . Concussion    sept 28th 2014  . Family history of breast cancer   . Family history of colon cancer   . Gastroparesis   . GERD (gastroesophageal reflux disease)   . HTN (hypertension)   . Hyperlipidemia   . Hypothyroidism   . Mitral valve prolapse   .  Nondiabetic gastroparesis   . PONV (postoperative nausea and vomiting)   . QT prolongation   . Tobacco abuse    down to 2 cigarettes per day  . Vertigo     Patient Active Problem List   Diagnosis Date Noted  . Genetic testing 12/31/2019  . AKI (acute kidney injury) ([place]) 08/06/2019  . Emesis, persistent 08/06/2019  . Viral syndrome 08/05/2019  . Tobacco abuse   . History of colonic polyps 07/23/2019  . Family history of breast cancer   . Family history of colon cancer   . Synovial cyst of lumbar spine 12/20/2018  . Myositis 05/11/2016  . Anxiety 05/26/2015  . Tremors of nervous system 05/26/2015  . Neck pain 05/26/2015  . Hot flushes, perimenopausal 07/24/2013  . CAD (coronary artery disease) 04/11/2012  . GERD (gastroesophageal reflux disease) 04/11/2012  . Addison's disease ([place]) 03/03/2012  . QT prolongation 12/15/2010  . Palpitations 01/01/2009  . Hypothyroidism 12/31/2008  . Hyperlipidemia 12/31/2008  . OVERWEIGHT/OBESITY 12/31/2008  . Essential hypertension 12/31/2008    Past Surgical History:  Procedure Laterality Date  . ABDOMINAL HYSTERECTOMY    . CARDIAC CATHETERIZATION  03/2007   showed 60% lesion in the right coronary artery  . CHOLECYSTECTOMY    . LEFT HEART CATHETERIZATION WITH CORONARY ANGIOGRAM N/A 04/13/2012   Procedure: LEFT HEART CATHETERIZATION WITH CORONARY ANGIOGRAM;  Surgeon: Hillary Bow, MD;  Location: [facility] CATH LAB;  Service: Cardiovascular;  Laterality: N/A;  . LUMBAR LAMINECTOMY/DECOMPRESSION MICRODISCECTOMY Left 12/20/2018   Procedure: Left Lumbar Three-Four Lumbar Laminotomy and Foraminotomy, Lumbar Four-Five Laminotomy and Foraminotomy with Microdiscectomy and Resection of Synovial Cyst;  Surgeon: Jovita Gamma, MD;  Location: [facility];  Service: Neurosurgery;  Laterality: Left;  Left Lumbar 3-4 Lumbar laminotomy, foraminotomy, possible microdiscectomy with possible resection of synovial cyst  . SPINE SURGERY    . VARICOSE VEIN SURGERY    .  VESICOVAGINAL FISTULA CLOSURE W/ TAH       OB History    Gravida  3   Para  2   Term  2   Preterm  0   AB  1   Living  2     SAB  1   TAB      Ectopic      Multiple      Live Births              Family History  Problem Relation Age of Onset  . Cancer Father        Colon  . Coronary artery disease Other   . Heart attack Mother        d. 79  . Breast cancer Paternal Uncle 26  . Dementia Maternal Grandmother   . Leukemia Maternal Uncle 1    Social History   Tobacco Use  . Smoking status: Current Every Day Smoker    Packs/day: 0.25    Years: 10.00    Pack years: 2.50    Types: Cigarettes  . Smokeless tobacco: Never Used  . Tobacco comment: smokes 2 a day  Vaping Use  . Vaping Use: Former  Substance Use Topics  . Alcohol use: No  . Drug use: No    Comment: Hisrory of Cocaine use     Home Medications Prior to Admission medications   Medication Sig Start Date End Date Taking? Authorizing Provider  albuterol (VENTOLIN HFA) 108 (90 Base) MCG/ACT inhaler Inhale 1-2 puffs into the lungs every 6 (six) hours as needed for wheezing or shortness of breath.  10/22/19   [provider]  amLODipine (NORVASC) 10 MG tablet Take 10 mg by mouth daily.  04/05/17   [provider]  aspirin 81 MG chewable tablet Chew 81 mg by mouth daily.     [provider]  cholecalciferol (VITAMIN D) 1000 UNITS tablet Take 1,000 Units by mouth daily.    [provider]  cyclobenzaprine (FLEXERIL) 10 MG tablet Take 10 mg by mouth 3 (three) times daily as needed for muscle spasms.  10/11/18   [provider]  gabapentin (NEURONTIN) 600 MG tablet Take 600 mg by mouth 4 (four) times daily. 07/25/19   [provider]  hydrocortisone (CORTEF) 10 MG tablet Take 10-20 mg by mouth See admin instructions. Takes 20 mg in the morning and 10 mg at night 04/07/16   [provider]  levofloxacin (LEVAQUIN) 500 MG tablet Take 500 mg by mouth  daily. For sinus infection 06/09/20   [provider]  levothyroxine (SYNTHROID, LEVOTHROID) 75 MCG tablet Take 75 mcg by mouth daily before breakfast.    [provider]  lidocaine (LIDODERM) 5 % Place 1 patch onto the skin  daily. Remove & Discard patch within 12 hours or as directed by MD 10/24/19   Evalee Jefferson, PA-C  meclizine (ANTIVERT) 25 MG tablet 1 or 2 tabs PO q8h prn dizziness Patient taking differently: Take 25-50 mg by mouth 3 (three) times daily as needed (for vertigo).  04/16/16   Francine Graven, DO  methylPREDNISolone (MEDROL DOSEPAK) 4 MG TBPK tablet Take 4 mg by mouth. Sinus infection 06/09/20   [provider]  nitroGLYCERIN (NITROSTAT) 0.4 MG SL tablet Place 1 tablet (0.4 mg total) under the tongue every 5 (five) minutes as needed for chest pain. 12/17/19   [name], PA  ondansetron (ZOFRAN ODT) 4 MG disintegrating tablet Take 1 tablet (4 mg total) by mouth every 8 (eight) hours as needed for nausea. 08/07/19   Roxan Hockey, MD  oxyCODONE (ROXICODONE) 15 MG immediate release tablet Take 15 mg by mouth every 4 (four) hours as needed for pain.  07/27/19   [provider]  polyethylene glycol (MIRALAX / GLYCOLAX) 17 g packet Take 17 g by mouth daily. Patient taking differently: Take 17 g by mouth as needed.  08/08/19   Roxan Hockey, MD  predniSONE (DELTASONE) 20 MG tablet Take 3 po QD x 3d , then 2 po QD x 3d then 1 po QD x 3d 07/06/20   Rolland Porter, MD  senna-docusate (SENOKOT-S) 8.6-50 MG tablet Take 2 tablets by mouth at bedtime. Patient taking differently: Take 1 tablet by mouth at bedtime as needed.  08/07/19 08/06/20  Roxan Hockey, MD  varenicline (CHANTIX CONTINUING MONTH PAK) 1 MG tablet Take 1 tablet (1 mg total) by mouth 2 (two) times daily. 06/10/20   [name], PA  zolpidem (AMBIEN) 10 MG tablet Take 10 mg by mouth at bedtime. 05/06/16   [provider]    Allergies    Bee venom, Doxycycline, Erythromycin, Ibuprofen,  Penicillins, Sulfa antibiotics, Cat hair extract, Dust mite extract, Tramadol, and Morphine and related  Review of Systems   Review of Systems  All other systems reviewed and are negative.   Physical Exam Updated Vital Signs BP (!) 160/95 (BP Location: Left Arm)   Pulse 80   Temp 98 F (36.7 C) (Oral)   Resp 16   Ht 5\' 5"  (1.651 m)   Wt 77.1 kg   SpO2 94%   BMI 28.29 kg/m   Physical Exam Vitals and nursing note reviewed.  Constitutional:      General: She is not in acute distress.    Appearance: Normal appearance. She is obese.  HENT:     Head: Normocephalic and atraumatic.     Right Ear: External ear normal.     Left Ear: External ear normal.  Eyes:     Extraocular Movements: Extraocular movements intact.     Conjunctiva/sclera: Conjunctivae normal.  Cardiovascular:     Rate and Rhythm: Normal rate.  Pulmonary:     Effort: Pulmonary effort is normal. No respiratory distress.  Musculoskeletal:     Cervical back: Normal range of motion.       Back:     Comments: Patient is tender in the sacral area on the left and over the SI joint on the left.  She is not tender over the sciatic notch in the left buttock.  Her reflexes are 1+ patellar on the left.  She does not appear to have increasing pain with flexing her leg and hip.  Skin:    General: Skin is warm and dry.     Findings:  No rash.  Neurological:     General: No focal deficit present.     Mental Status: She is alert and oriented to person, place, and time.     Cranial Nerves: No cranial nerve deficit.  Psychiatric:        Mood and Affect: Mood normal.        Behavior: Behavior normal.        Thought Content: Thought content normal.     ED Results / Procedures / Treatments   Labs (all labs ordered are listed, but only abnormal results are displayed) Labs Reviewed - No data to display  EKG None  Radiology No results found.  Procedures Procedures (including critical care time)     Medications  Ordered in ED Medications  ketorolac (TORADOL) 30 MG/ML injection 30 mg (has no administration in time range)  dexamethasone (DECADRON) injection 10 mg (10 mg Intramuscular Given 07/06/20 0226)  diazepam (VALIUM) tablet 5 mg (5 mg Oral Given 07/06/20 0226)    ED Course  I have reviewed the triage vital signs and the nursing notes.  Pertinent labs & imaging results that were available during my care of the patient were reviewed by me and considered in my medical decision making (see chart for details).    MDM Rules/Calculators/A&P                         Patient states her son can come pick her up, she came by EMS.  Reviewing her lab work from May 7 shows her BUN was 21 and creatinine was 1.08 with GFR 58 cc/h and glucose of 118.  Patient was given Decadron IM and Valium orally.  I have reviewed the [place] see above.  Recheck at 3:20 AM patient states she is not any better at all.  She was given Toradol IM.  She then tells me she has not had any her pain pills for 8 hours.  I told her to call her son to pick her up and he can bring her pain pills.  Final Clinical Impression(s) / ED Diagnoses Final diagnoses:  Acute exacerbation of chronic low back pain    Rx / DC Orders ED Discharge Orders         Ordered    predniSONE (DELTASONE) 20 MG tablet        07/06/20 0328         Plan discharge  Rolland Porter, MD, Barbette Or, MD 07/06/20 [place], [place], MD 07/06/20 [phone]

## 2020-07-06 NOTE — Discharge Instructions (Addendum)
Use ice and heat as needed for comfort.  Take the prednisone as prescribed.  Please follow-up with your primary care doctor on Monday, November 1 if you are not improving.

## 2020-07-06 NOTE — ED Triage Notes (Signed)
Pt c/o back pain x 2 weeks worsening today; hx of back surgeries; saw PCP and was prescribed a muscle relaxer with no relief

## 2020-07-06 NOTE — ED Triage Notes (Signed)
Pt reports seen for same last night and reports no improvement in pain. Pt reports was told need to have MRI and is unable to take prednisone without consulting primary care first. Pt did not fill px last night and reports continued left buttock pain, pt reports left leg gave out this am and repots unable to bear weight due to pain. No obvious deformity noted at time of arrival via [facility].

## 2020-07-06 NOTE — ED Provider Notes (Signed)
[facility] EMERGENCY DEPARTMENT Provider Note   CSN: [id number] Arrival date & time: 07/06/20  1238     History Chief Complaint  Patient presents with  . Back Pain    [name] is a 54 y.o. female.  HPI      [name] is a 54 y.o. female with past medical history of adrenal insufficiency, Addison's disease, hypertension, CHF and chronic low back pain who presents to the Emergency Department complaining of persistent pain of her left lower back for 3 days.  She was seen here this morning at 2 AM for similar symptoms and given an injection of Toradol and decadron without relief. She returns this afternoon complaining of continued sharp pain along her left lower back into her sacral area and left buttock.  She describes intermittent sharp pains that radiate down her left posterior leg to the level of her foot.  She states that her pain is worse since discharge this morning.  She reports slipping and falling while in the bathroom this morning landing on her right side.  She states that her left foot "gave away" which caused the fall. Denies other injuries.  Her back pain is now worse since her fall.  She states the pain is constant even while lying supine, but worsens with attempts to walk or stand.  She has been taking her oxycodone without relief of her pain.  She was seen by her PCP last week and scheduled for an epidural injection that was canceled due to a question with her insurance.  Currently, she denies numbness or weakness of her left lower leg, abdominal pain, urine or bowel changes, fever or chills.  Hx of laminectomy in 2020    Past Medical History:  Diagnosis Date  . Addison's disease ([place])   . Adrenal insufficiency ([place])    diagnosed 2012  . Aneurysm ([place])   . Anxiety   . Arthritis   . Astigmatism   . CAD (coronary artery disease)    Cath 2008 EF normal. RCA 50-60, Septal 50%. Myoview 3/12: EF 53% normal perfusion  . Cardiac arrest ([place])    2/2 adissonian  crisis  . Cardiomyopathy    resolved  . Chest pain    chronicc  . CHF (congestive heart failure) ([place])   . Chronic back pain   . Chronic diarrhea   . Concussion    sept 28th 2014  . Family history of breast cancer   . Family history of colon cancer   . Gastroparesis   . GERD (gastroesophageal reflux disease)   . HTN (hypertension)   . Hyperlipidemia   . Hypothyroidism   . Mitral valve prolapse   . Nondiabetic gastroparesis   . PONV (postoperative nausea and vomiting)   . QT prolongation   . Tobacco abuse    down to 2 cigarettes per day  . Vertigo     Patient Active Problem List   Diagnosis Date Noted  . Genetic testing 12/31/2019  . AKI (acute kidney injury) ([place]) 08/06/2019  . Emesis, persistent 08/06/2019  . Viral syndrome 08/05/2019  . Tobacco abuse   . History of colonic polyps 07/23/2019  . Family history of breast cancer   . Family history of colon cancer   . Synovial cyst of lumbar spine 12/20/2018  . Myositis 05/11/2016  . Anxiety 05/26/2015  . Tremors of nervous system 05/26/2015  . Neck pain 05/26/2015  . Hot flushes, perimenopausal 07/24/2013  . CAD (coronary artery disease) 04/11/2012  .  GERD (gastroesophageal reflux disease) 04/11/2012  . Addison's disease ([place]) 03/03/2012  . QT prolongation 12/15/2010  . Palpitations 01/01/2009  . Hypothyroidism 12/31/2008  . Hyperlipidemia 12/31/2008  . OVERWEIGHT/OBESITY 12/31/2008  . Essential hypertension 12/31/2008    Past Surgical History:  Procedure Laterality Date  . ABDOMINAL HYSTERECTOMY    . CARDIAC CATHETERIZATION  03/2007   showed 60% lesion in the right coronary artery  . CHOLECYSTECTOMY    . LEFT HEART CATHETERIZATION WITH CORONARY ANGIOGRAM N/A 04/13/2012   Procedure: LEFT HEART CATHETERIZATION WITH CORONARY ANGIOGRAM;  Surgeon: Hillary Bow, MD;  Location: [facility] CATH LAB;  Service: Cardiovascular;  Laterality: N/A;  . LUMBAR LAMINECTOMY/DECOMPRESSION MICRODISCECTOMY Left 12/20/2018    Procedure: Left Lumbar Three-Four Lumbar Laminotomy and Foraminotomy, Lumbar Four-Five Laminotomy and Foraminotomy with Microdiscectomy and Resection of Synovial Cyst;  Surgeon: Jovita Gamma, MD;  Location: [place];  Service: Neurosurgery;  Laterality: Left;  Left Lumbar 3-4 Lumbar laminotomy, foraminotomy, possible microdiscectomy with possible resection of synovial cyst  . SPINE SURGERY    . VARICOSE VEIN SURGERY    . VESICOVAGINAL FISTULA CLOSURE W/ TAH       OB History    Gravida  3   Para  2   Term  2   Preterm  0   AB  1   Living  2     SAB  1   TAB      Ectopic      Multiple      Live Births              Family History  Problem Relation Age of Onset  . Cancer Father        Colon  . Coronary artery disease Other   . Heart attack Mother        d. 86  . Breast cancer Paternal Uncle 67  . Dementia Maternal Grandmother   . Leukemia Maternal Uncle 1    Social History   Tobacco Use  . Smoking status: Current Every Day Smoker    Packs/day: 0.25    Years: 10.00    Pack years: 2.50    Types: Cigarettes  . Smokeless tobacco: Never Used  . Tobacco comment: smokes 2 a day  Vaping Use  . Vaping Use: Former  Substance Use Topics  . Alcohol use: No  . Drug use: No    Comment: Hisrory of Cocaine use     Home Medications Prior to Admission medications   Medication Sig Start Date End Date Taking? Authorizing Provider  albuterol (VENTOLIN HFA) 108 (90 Base) MCG/ACT inhaler Inhale 1-2 puffs into the lungs every 6 (six) hours as needed for wheezing or shortness of breath.  10/22/19   [provider]  amLODipine (NORVASC) 10 MG tablet Take 10 mg by mouth daily.  04/05/17   [provider]  aspirin 81 MG chewable tablet Chew 81 mg by mouth daily.     [provider]  cholecalciferol (VITAMIN D) 1000 UNITS tablet Take 1,000 Units by mouth daily.    [provider]  cyclobenzaprine (FLEXERIL) 10 MG tablet Take 10 mg by mouth 3  (three) times daily as needed for muscle spasms.  10/11/18   [provider]  gabapentin (NEURONTIN) 600 MG tablet Take 600 mg by mouth 4 (four) times daily. 07/25/19   [provider]  hydrocortisone (CORTEF) 10 MG tablet Take 10-20 mg by mouth See admin instructions. Takes 20 mg in the morning and 10 mg at night 04/07/16  [provider]  levofloxacin (LEVAQUIN) 500 MG tablet Take 500 mg by mouth daily. For sinus infection 06/09/20   [provider]  levothyroxine (SYNTHROID, LEVOTHROID) 75 MCG tablet Take 75 mcg by mouth daily before breakfast.    [provider]  lidocaine (LIDODERM) 5 % Place 1 patch onto the skin daily. Remove & Discard patch within 12 hours or as directed by MD 10/24/19   Evalee Jefferson, PA-C  meclizine (ANTIVERT) 25 MG tablet 1 or 2 tabs PO q8h prn dizziness Patient taking differently: Take 25-50 mg by mouth 3 (three) times daily as needed (for vertigo).  04/16/16   Francine Graven, DO  methylPREDNISolone (MEDROL DOSEPAK) 4 MG TBPK tablet Take 4 mg by mouth. Sinus infection 06/09/20   [provider]  nitroGLYCERIN (NITROSTAT) 0.4 MG SL tablet Place 1 tablet (0.4 mg total) under the tongue every 5 (five) minutes as needed for chest pain. 12/17/19   [name], PA  ondansetron (ZOFRAN ODT) 4 MG disintegrating tablet Take 1 tablet (4 mg total) by mouth every 8 (eight) hours as needed for nausea. 08/07/19   Roxan Hockey, MD  oxyCODONE (ROXICODONE) 15 MG immediate release tablet Take 15 mg by mouth every 4 (four) hours as needed for pain.  07/27/19   [provider]  polyethylene glycol (MIRALAX / GLYCOLAX) 17 g packet Take 17 g by mouth daily. Patient taking differently: Take 17 g by mouth as needed.  08/08/19   Roxan Hockey, MD  predniSONE (DELTASONE) 20 MG tablet Take 3 po QD x 3d , then 2 po QD x 3d then 1 po QD x 3d 07/06/20   Rolland Porter, MD  senna-docusate (SENOKOT-S) 8.6-50 MG tablet Take 2 tablets by mouth at  bedtime. Patient taking differently: Take 1 tablet by mouth at bedtime as needed.  08/07/19 08/06/20  Roxan Hockey, MD  varenicline (CHANTIX CONTINUING MONTH PAK) 1 MG tablet Take 1 tablet (1 mg total) by mouth 2 (two) times daily. 06/10/20   [name], PA  zolpidem (AMBIEN) 10 MG tablet Take 10 mg by mouth at bedtime. 05/06/16   [provider]    Allergies    Bee venom, Doxycycline, Erythromycin, Ibuprofen, Penicillins, Sulfa antibiotics, Cat hair extract, Dust mite extract, Tramadol, and Morphine and related  Review of Systems   Review of Systems  Constitutional: Negative for fever.  Respiratory: Negative for shortness of breath.   Cardiovascular: Negative for chest pain.  Gastrointestinal: Negative for abdominal pain, constipation, diarrhea, nausea and vomiting.  Genitourinary: Negative for decreased urine volume, difficulty urinating, dysuria, flank pain and hematuria.  Musculoskeletal: Positive for back pain. Negative for joint swelling.  Skin: Negative for rash.  Neurological: Negative for dizziness, syncope, weakness and numbness.    Physical Exam Updated Vital Signs BP (!) 153/79 (BP Location: Right Arm)   Pulse 92   Temp 98.1 F (36.7 C) (Oral)   Ht 5\' 5"  (1.651 m)   Wt 77 kg   SpO2 98%   BMI 28.25 kg/m   Physical Exam Vitals and nursing note reviewed.  Constitutional:      Appearance: Normal appearance.     Comments: Uncomfortable appearing  HENT:     Head: Atraumatic.  Cardiovascular:     Rate and Rhythm: Normal rate and regular rhythm.     Pulses: Normal pulses.  Pulmonary:     Effort: Pulmonary effort is normal.     Breath sounds: Normal breath sounds.  Chest:     Chest wall: No tenderness.  Abdominal:     General: There is no distension.     Palpations: Abdomen is soft.     Tenderness: There is no abdominal tenderness. There is no right CVA tenderness or left CVA tenderness.  Musculoskeletal:        General: Tenderness and signs of injury  present. No swelling.     Right lower leg: No edema.     Left lower leg: No edema.     Comments: ttp of the left lower lumbar paraspinal muscles and over SI joint space.  positive SLE on left at 15 degrees.  Hip flexors and extensors intact.  No edema or ecchymosis.    Skin:    General: Skin is warm.     Capillary Refill: Capillary refill takes less than 2 seconds.     Findings: No rash.  Neurological:     General: No focal deficit present.     Mental Status: She is alert.     Sensory: Sensation is intact.     Motor: Motor function is intact.     Deep Tendon Reflexes:     Reflex Scores:      Patellar reflexes are 2+ on the right side and 2+ on the left side.    ED Results / Procedures / Treatments   Labs (all labs ordered are listed, but only abnormal results are displayed) Labs Reviewed - No data to display  EKG None  Radiology DG Lumbar Spine Complete  Result Date: 07/06/2020 CLINICAL DATA:  [place] last night.  Back pain. EXAM: LUMBAR SPINE - COMPLETE 4+ VIEW COMPARISON:  Multiple previous lumbar spine series. FINDINGS: Normal alignment of the lumbar vertebral bodies. Disc spaces and vertebral bodies are maintained. The facets are normally aligned. No pars defects. The visualized bony pelvis is intact. Ileus appearing bowel gas pattern. IMPRESSION: Normal alignment and no acute bony findings or degenerative changes. Electronically Signed   By: Marijo Sanes M.D.   On: 07/06/2020 14:36    Procedures Procedures (including critical care time)  Medications Ordered in ED Medications - No data to display  ED Course  I have reviewed the triage vital signs and the nursing notes.  Pertinent labs & imaging results that were available during my care of the patient were reviewed by me and considered in my medical decision making (see chart for details).    MDM Rules/Calculators/A&P                          patient was seen here earlier this morning for low back pain and returns  complaining of worsening pain of her left lower back and a fall that occurred this morning after returning home.  No other reported injuries.    Pt using walker at baseline.  No focal neuro deficits.  No saddle anesthesias or other exam findings to suggest cauda equina.  Doubt emergent neurological process.   Plain film XR of the L spine ordered due to reported fall this morning, XR does not show any evidence of malalignment or other acute bony findings.  No urinary symptoms to suggest need for U/A.  On recheck, reports feeling better after injection given here.  Has recently filled rx for pain medication, muscle relaxer's at home.  rx written for lidocaine patches.    The patient appears reasonably screened and/or stabilized for discharge and I doubt any other medical condition or other [facility] requiring further screening, evaluation, or treatment in the ED at this time  prior to discharge.    Final Clinical Impression(s) / ED Diagnoses Final diagnoses:  Acute exacerbation of chronic low back pain    Rx / DC Orders ED Discharge Orders    None       Kem Parkinson, PA-C 07/06/20 1531    Isla Pence, MD 07/08/20 1505

## 2020-07-13 IMAGING — US US EXTREM LOW VENOUS*R*
1 series · 13 of 24 positions shown · non-contrast
Comparison: None.

CLINICAL DATA: Right calf region pain

EXAM:
RIGHT LOWER EXTREMITY VENOUS DUPLEX ULTRASOUND
TECHNIQUE: Gray-scale sonography with graded compression, as well as color
Doppler and duplex ultrasound were performed to evaluate the right
lower extremity deep venous system from the level of the common
femoral vein and including the common femoral, femoral, profunda
femoral, popliteal and calf veins including the posterior tibial,
peroneal and gastrocnemius veins when visible. The superficial great
saphenous vein was also interrogated. Spectral Doppler was utilized
to evaluate flow at rest and with distal augmentation maneuvers in
the common femoral, femoral and popliteal veins.

[Series 1: us extrem low venous*right* · 13 of 39 slices shown]
[im 1/39]
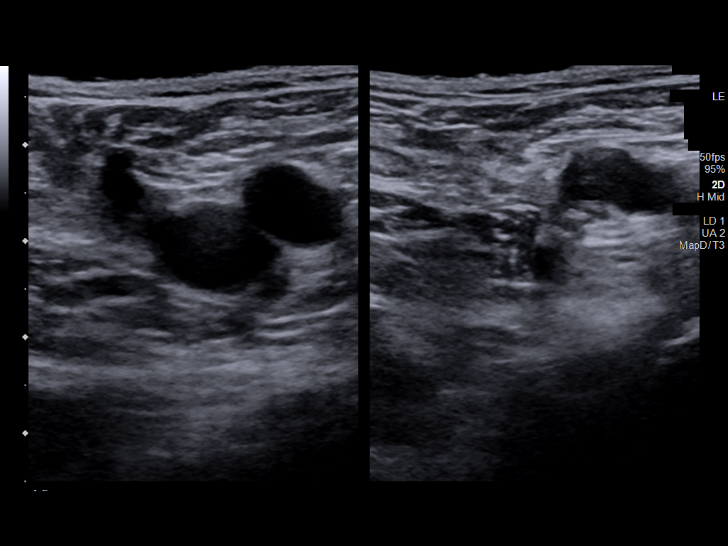
[im 4/39]
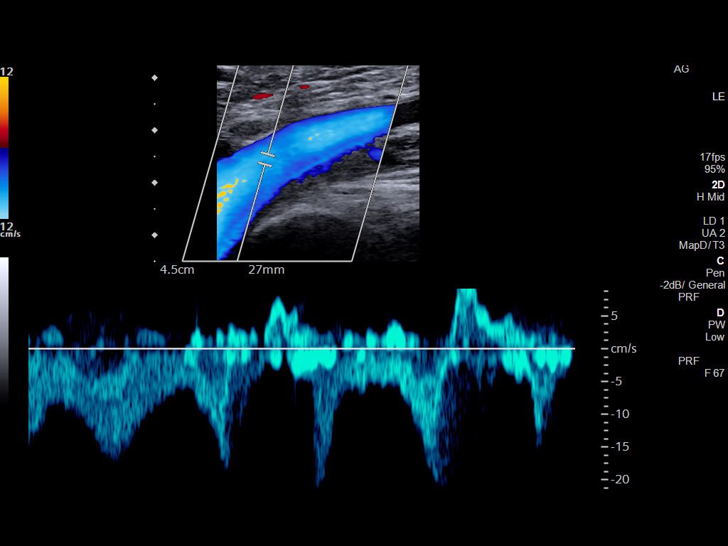
[im 7/39]
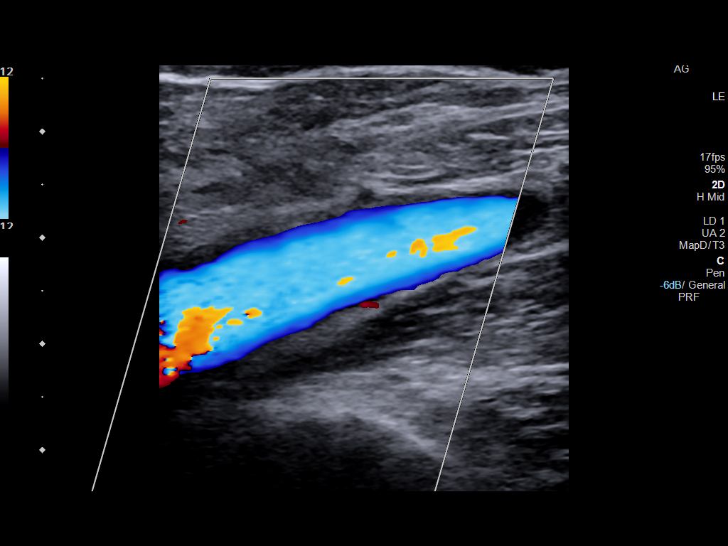
[im 10/39]
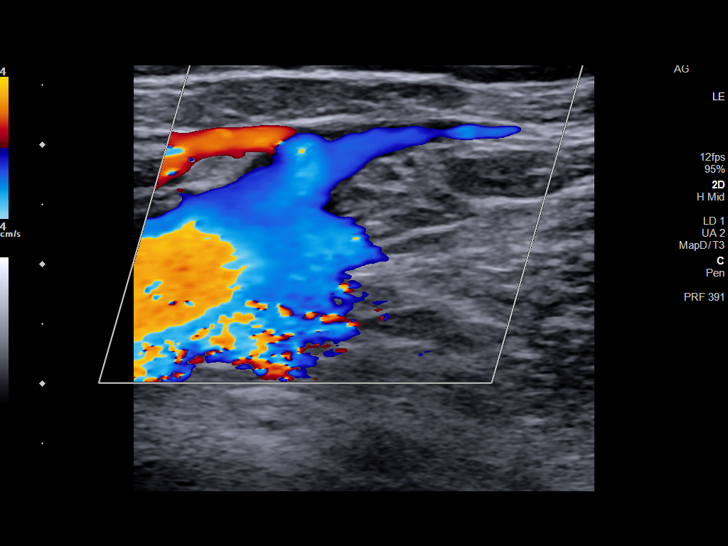
[im 14/39]
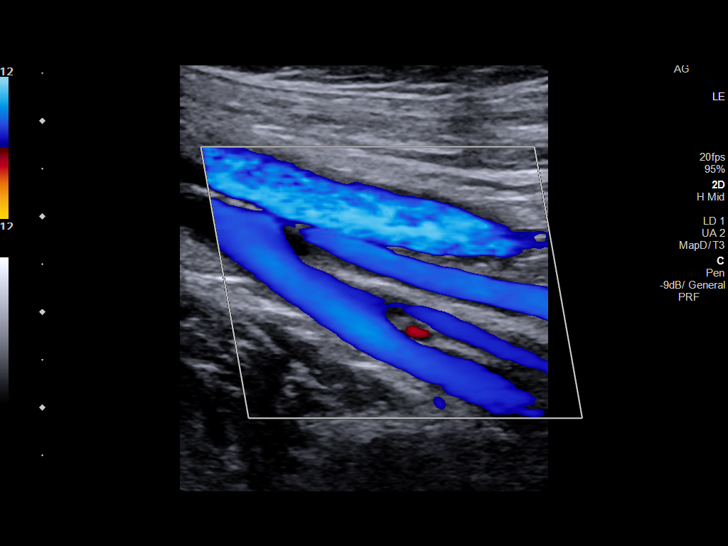
[im 17/39]
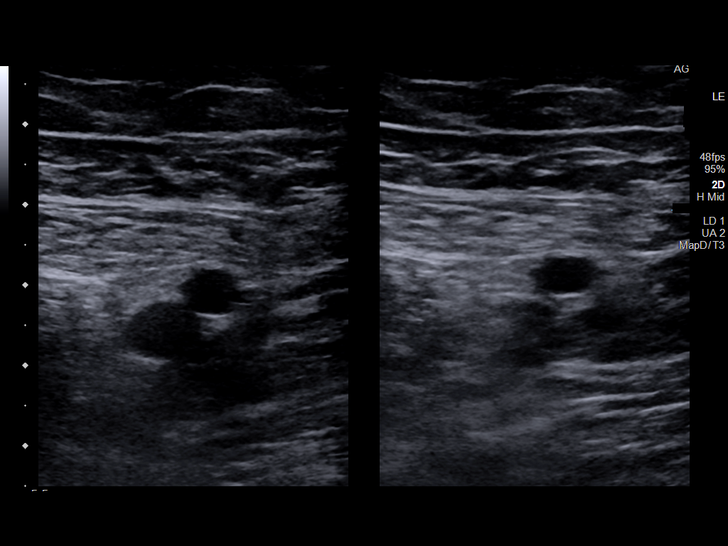
[im 20/39]
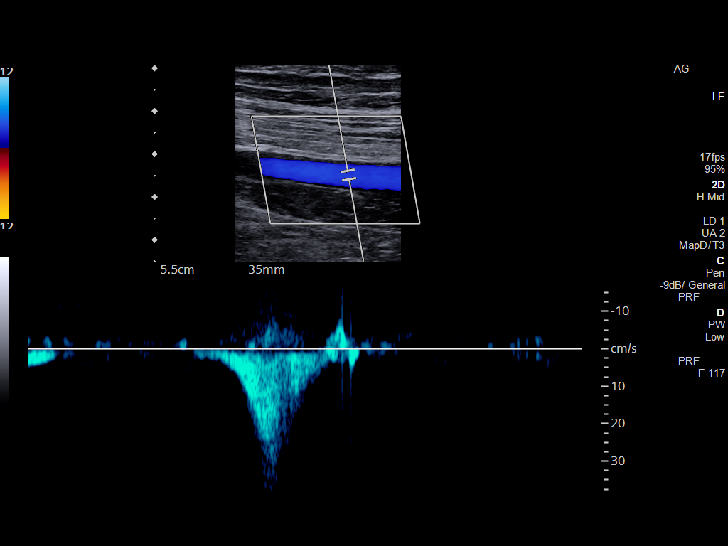
[im 22/39]
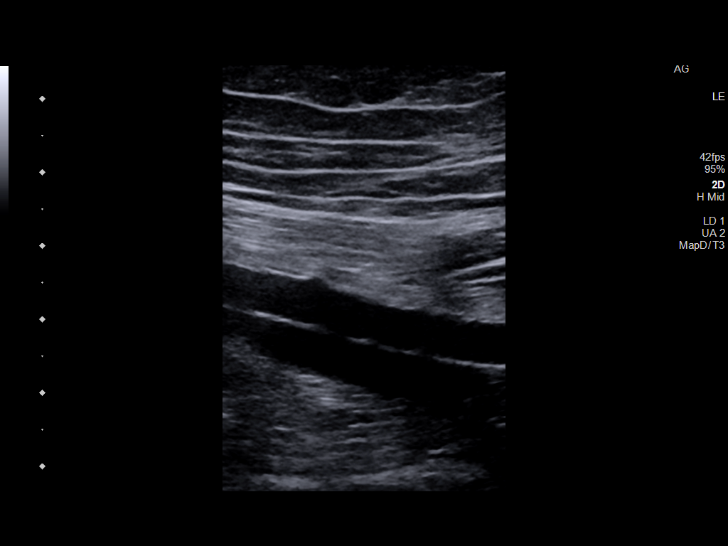
[im 25/39]
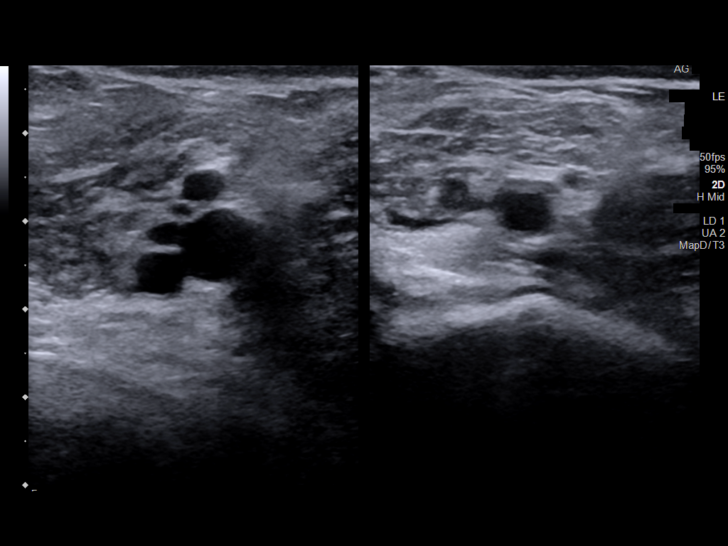
[im 29/39]
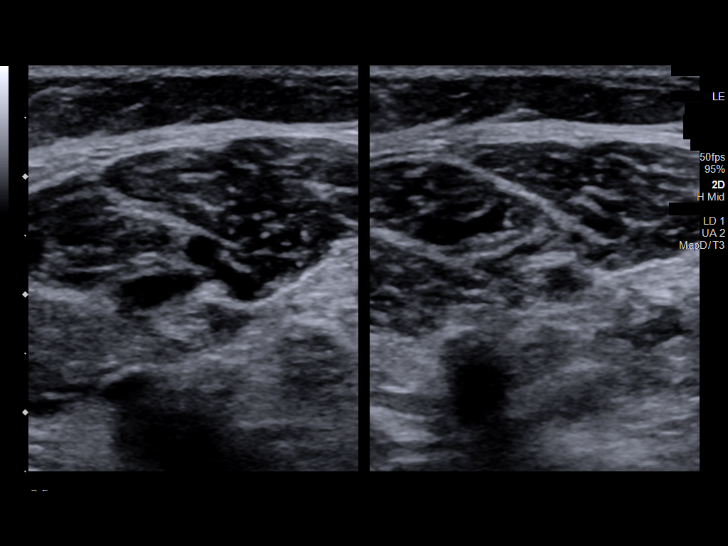
[im 32/39]
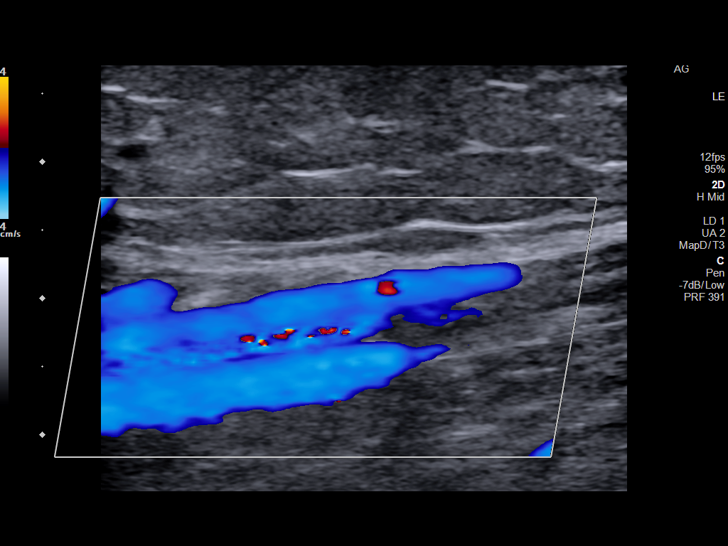
[im 35/39]
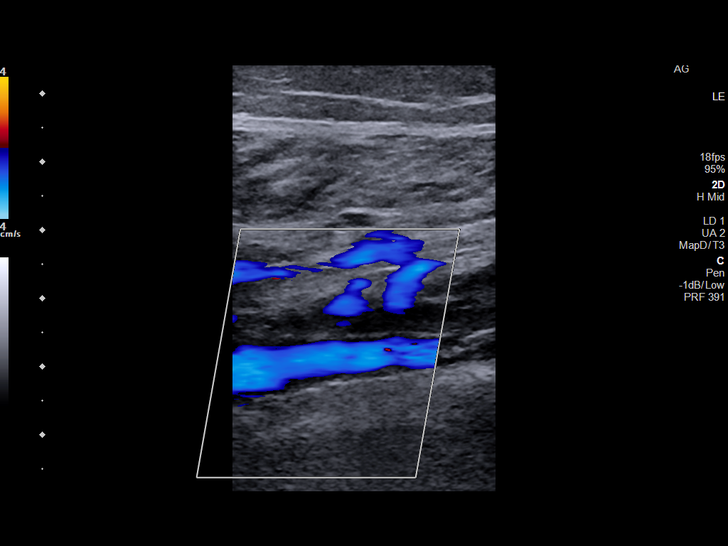
[im 39/39]
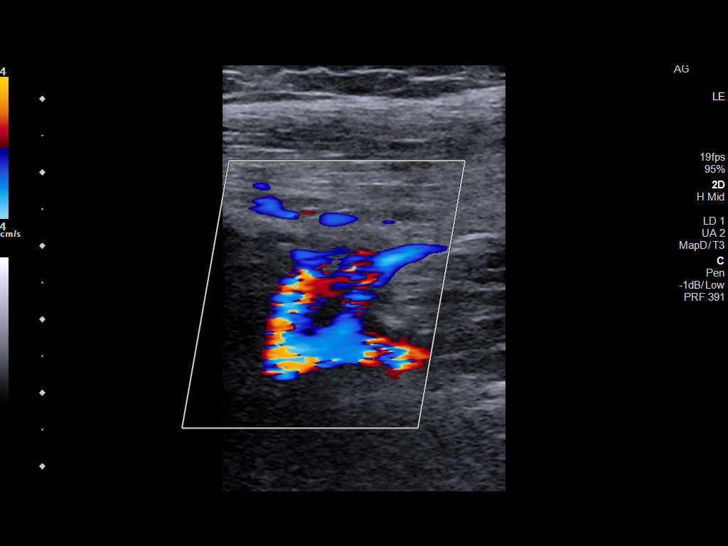

[13 of 24 positions shown; findings below may reference images not displayed]

FINDINGS: Contralateral Common Femoral Vein: Respiratory phasicity is normal
and symmetric with the symptomatic side. No evidence of thrombus.
Normal compressibility.

Common Femoral Vein: No evidence of thrombus. Normal
compressibility, respiratory phasicity and response to augmentation.

Saphenofemoral Junction: No evidence of thrombus. Normal
compressibility and flow on color Doppler imaging.

Profunda Femoral Vein: No evidence of thrombus. Normal
compressibility and flow on color Doppler imaging.

Femoral Vein: No evidence of thrombus. Normal compressibility,
respiratory phasicity and response to augmentation.

Popliteal Vein: No evidence of thrombus. Normal compressibility,
respiratory phasicity and response to augmentation.

Calf Veins: No evidence of thrombus. Normal compressibility and flow
on color Doppler imaging.

Superficial Great Saphenous Vein: No evidence of thrombus. Normal
compressibility.

Venous Reflux:  None.

Other Findings:  None.  No Baker cyst in particular.
IMPRESSION: No evidence of deep venous thrombosis in the right lower extremity.
Left common femoral vein patent. No lesions seen in the right
popliteal fossa.

## 2020-07-15 DIAGNOSIS — R739 Hyperglycemia, unspecified: Secondary | ICD-10-CM | POA: Diagnosis not present

## 2020-07-15 DIAGNOSIS — E039 Hypothyroidism, unspecified: Secondary | ICD-10-CM | POA: Diagnosis not present

## 2020-07-15 DIAGNOSIS — I10 Essential (primary) hypertension: Secondary | ICD-10-CM | POA: Diagnosis not present

## 2020-07-15 DIAGNOSIS — M792 Neuralgia and neuritis, unspecified: Secondary | ICD-10-CM | POA: Diagnosis not present

## 2020-07-15 DIAGNOSIS — Z1321 Encounter for screening for nutritional disorder: Secondary | ICD-10-CM | POA: Diagnosis not present

## 2020-07-15 DIAGNOSIS — M8589 Other specified disorders of bone density and structure, multiple sites: Secondary | ICD-10-CM | POA: Diagnosis not present

## 2020-07-15 DIAGNOSIS — E273 Drug-induced adrenocortical insufficiency: Secondary | ICD-10-CM | POA: Diagnosis not present

## 2020-07-24 DIAGNOSIS — S99912A Unspecified injury of left ankle, initial encounter: Secondary | ICD-10-CM | POA: Diagnosis not present

## 2020-07-24 DIAGNOSIS — S93402A Sprain of unspecified ligament of left ankle, initial encounter: Secondary | ICD-10-CM | POA: Diagnosis not present

## 2020-08-06 DIAGNOSIS — G894 Chronic pain syndrome: Secondary | ICD-10-CM | POA: Diagnosis not present

## 2020-08-06 DIAGNOSIS — E271 Primary adrenocortical insufficiency: Secondary | ICD-10-CM | POA: Diagnosis not present

## 2020-08-06 DIAGNOSIS — R69 Illness, unspecified: Secondary | ICD-10-CM | POA: Diagnosis not present

## 2020-08-06 DIAGNOSIS — E063 Autoimmune thyroiditis: Secondary | ICD-10-CM | POA: Diagnosis not present

## 2020-08-27 ENCOUNTER — Other Ambulatory Visit: Payer: Self-pay | Admitting: Neurology

## 2020-08-27 DIAGNOSIS — M5416 Radiculopathy, lumbar region: Secondary | ICD-10-CM | POA: Diagnosis not present

## 2020-08-27 DIAGNOSIS — R251 Tremor, unspecified: Secondary | ICD-10-CM | POA: Diagnosis not present

## 2020-08-27 DIAGNOSIS — M545 Low back pain, unspecified: Secondary | ICD-10-CM | POA: Diagnosis not present

## 2020-08-27 DIAGNOSIS — M542 Cervicalgia: Secondary | ICD-10-CM | POA: Diagnosis not present

## 2020-08-27 DIAGNOSIS — R252 Cramp and spasm: Secondary | ICD-10-CM | POA: Diagnosis not present

## 2020-09-06 HISTORY — PX: SPINE SURGERY: SHX786

## 2020-09-08 DIAGNOSIS — M1991 Primary osteoarthritis, unspecified site: Secondary | ICD-10-CM | POA: Diagnosis not present

## 2020-09-08 DIAGNOSIS — G894 Chronic pain syndrome: Secondary | ICD-10-CM | POA: Diagnosis not present

## 2020-09-09 ENCOUNTER — Other Ambulatory Visit (HOSPITAL_COMMUNITY): Payer: Self-pay | Admitting: Physician Assistant

## 2020-09-09 DIAGNOSIS — Z1231 Encounter for screening mammogram for malignant neoplasm of breast: Secondary | ICD-10-CM

## 2020-09-11 ENCOUNTER — Ambulatory Visit
Admission: RE | Admit: 2020-09-11 | Discharge: 2020-09-11 | Disposition: A | Discharge status: Home / Self Care | Payer: Medicare HMO | Source: Ambulatory Visit | Attending: Neurology | Admitting: Neurology

## 2020-09-11 DIAGNOSIS — M47816 Spondylosis without myelopathy or radiculopathy, lumbar region: Secondary | ICD-10-CM | POA: Diagnosis not present

## 2020-09-11 DIAGNOSIS — M5416 Radiculopathy, lumbar region: Secondary | ICD-10-CM

## 2020-09-11 MED ORDER — IOPAMIDOL (ISOVUE-M 200) INJECTION 41%
1.0000 mL | Freq: Once | INTRAMUSCULAR | Status: AC
  Administered 2020-09-11: 1 mL via EPIDURAL

## 2020-09-11 MED ORDER — METHYLPREDNISOLONE ACETATE 40 MG/ML INJ SUSP (RADIOLOG
120.0000 mg | Freq: Once | INTRAMUSCULAR | Status: AC
  Administered 2020-09-11: 120 mg via EPIDURAL

## 2020-09-11 NOTE — Discharge Instructions (Signed)

## 2020-09-16 ENCOUNTER — Other Ambulatory Visit: Payer: Self-pay | Admitting: Neurology

## 2020-09-16 ENCOUNTER — Telehealth: Payer: Self-pay | Admitting: Internal Medicine

## 2020-09-16 DIAGNOSIS — R001 Bradycardia, unspecified: Secondary | ICD-10-CM | POA: Diagnosis not present

## 2020-09-16 DIAGNOSIS — R609 Edema, unspecified: Secondary | ICD-10-CM | POA: Diagnosis not present

## 2020-09-16 DIAGNOSIS — M5416 Radiculopathy, lumbar region: Secondary | ICD-10-CM

## 2020-09-16 DIAGNOSIS — R079 Chest pain, unspecified: Secondary | ICD-10-CM | POA: Diagnosis not present

## 2020-09-16 DIAGNOSIS — R0789 Other chest pain: Secondary | ICD-10-CM | POA: Diagnosis not present

## 2020-09-16 NOTE — Telephone Encounter (Signed)
Spoke with patient. Patient reports in the last week she has gained 15lbs. 2 days ago she weighed 182lbs and today she weighs 192lbs, 10lbs in 2 days. Patient is swollen from pelvis to feet. She reports her loose skin is no longer loose and is tight.   Patient reports shortness of breath at times. Patient reports active chest pain in center / left side of chest described as a pressure and sharp pain. Patient has dizziness at this moment.   Patient advised to call 911 for evaluation or report to the nearest emergency room due to active chest pain. Patient to keep virtual visit tomorrow as follow up if she is not admitted. Patient reports she has a paraplegic daughter and cannot leave the house at this very moment to go to the hospital. Advised to call 911 and allow EMS to evaluate the situation. Patient verbalized understanding.

## 2020-09-16 NOTE — Telephone Encounter (Signed)
Pt c/o swelling: STAT is pt has developed SOB within 24 hours  1) How much weight have you gained and in what time span? Yes- 8lbs in a week  2) Are you currently taking a fluid pill? no  3) Are you currently SOB? No, sometimes  4) Do you have a log of your daily weights (if so, list)? no  5) Have you gained 3 pounds in a day or 5 pounds in a week? Not sure  6) Have you traveled recently? No- pt says she is so swollen from her thighs all the way down to her legs. She has a Virtual Visit tomorrow- please call to evaluate

## 2020-09-17 ENCOUNTER — Telehealth (INDEPENDENT_AMBULATORY_CARE_PROVIDER_SITE_OTHER): Payer: Medicare HMO | Admitting: Internal Medicine

## 2020-09-17 ENCOUNTER — Telehealth: Payer: Self-pay

## 2020-09-17 ENCOUNTER — Encounter: Payer: Self-pay | Admitting: Internal Medicine

## 2020-09-17 VITALS — BP 118/91 | HR 74 | Ht 65.0 in | Wt 192.0 lb

## 2020-09-17 DIAGNOSIS — E039 Hypothyroidism, unspecified: Secondary | ICD-10-CM

## 2020-09-17 DIAGNOSIS — I10 Essential (primary) hypertension: Secondary | ICD-10-CM

## 2020-09-17 DIAGNOSIS — R600 Localized edema: Secondary | ICD-10-CM | POA: Diagnosis not present

## 2020-09-17 DIAGNOSIS — R079 Chest pain, unspecified: Secondary | ICD-10-CM

## 2020-09-17 MED ORDER — FUROSEMIDE 20 MG PO TABS
20.0000 mg | ORAL_TABLET | Freq: Every day | ORAL | 2 refills | Status: DC
Start: 2020-09-17 — End: 2020-11-13

## 2020-09-17 MED ORDER — NITROGLYCERIN 0.4 MG SL SUBL: 0.4000 mg | SUBLINGUAL_TABLET | SUBLINGUAL | 2 refills | Status: DC | PRN

## 2020-09-17 NOTE — Progress Notes (Signed)
Virtual Visit via Telephone Note   This visit type was conducted due to national recommendations for restrictions regarding the COVID-19 Pandemic (e.g. social distancing) in an effort to limit this patient's exposure and mitigate transmission in our community.  Due to her co-morbid illnesses, this patient is at least at moderate risk for complications without adequate follow up.  This format is felt to be most appropriate for this patient at this time.  The patient did not have access to video technology/had technical difficulties with video requiring transitioning to audio format only (telephone).  All issues noted in this document were discussed and addressed.  No physical exam could be performed with this format.  Please refer to the patient's chart for her  consent to telehealth for [facility].   Date:  09/17/2020   ID:  [name], DOB 12-20-1965, MRN [id number] The patient was identified using 2 identifiers.  Patient Location: Home Provider Location: Office/Clinic  PCP:  Redmond School, MD  Cardiologist:  Pixie Casino, MD  Electrophysiologist:  None   Evaluation Performed:  Follow-Up Visit  Chief Complaint:  Leg swelling  History of Present Illness:    [name] is a 55 y.o. female with  hx of HTN, HLD,andtobacco abuse.In 2013, she was evaluated for chest pain with left heart cath which showed preserved LV function and no significant obstructive coronary artery disease. Previously has been reported there was a 50-60% stenosis in mid RCA which was noted to be completely resolved in 2013 favoring diagnosis of coronary spasm. She has a history of Addison's disease, hypothyroidism, prolonged QT interval and remote history of nonischemic cardiomyopathy with EF 40-45% which later normalized.Last echocardiogram obtained on 05/26/2015 showed EF 55 to 60%, grade 1 DD.She was admitted in December 2020 with pleuritic chest pain, headache, dizziness and a fever with T-max  102.9.She was exposed to relatives who have tested positive for COVID-19, although her own COVID-19 test was negative. She also had intractable emesis and AKI that resolved with antiemetic and hydration. She was seen in the ED again February 2021 with bilateral flank pain that worse with movement. CT scan was negative for acute finding. She was treated for muscle spasm. EKG at the time showed normal sinus rhythm without significant ST-T wave changes.  When I saw the patient back in April 2021, she was having some intermittent chest pain.  Myoview obtained on 01/08/2020 showed EF 59%, medium defect of moderate severity present in the mid anterior and apical anterior location likely shifting breast attenuation artifact, overall low risk study.  Patient was seen today via virtual visit.  She denies any recent chest pain or shortness of breath.  She just finished the starting pack for the Chantix last month.  For several month, she did not take Chantix due to a recall for the medication.  She has resumed the maintenance dose for the medication last month.  She does need a refill for the Chantix as well.  Currently she is smoking about 7 cigarettes/day and is trying to cut down further.  Overall, I think she is doing well from cardiac perspective and can follow-up in 6 months.  09/17/2020  [name] was seen today for video visit.  She reports over the last few weeks she has had worsening swelling and some shortness of breath.  Over the past 2 days however she said she had gained 10 pounds.  She is also having some chest pain.  This has been an on and off  symptom.  She was advised to go to the emergency department but has to take care of her paraplegic daughter and therefore could not.  She reports a lot of her swelling did go down overnight however she still has some significant edema which she was able to demonstrate to be pitting today on exam.  She is not on a diuretic but does take amlodipine.  The  patient does not have symptoms concerning for COVID-19 infection (fever, chills, cough, or new shortness of breath).    Past Medical History:  Diagnosis Date  . Addison's disease ([place])   . Adrenal insufficiency ([place])    diagnosed 2012  . Aneurysm ([place])   . Anxiety   . Arthritis   . Astigmatism   . CAD (coronary artery disease)    Cath 2008 EF normal. RCA 50-60, Septal 50%. Myoview 3/12: EF 53% normal perfusion  . Cardiac arrest ([place])    2/2 adissonian crisis  . Cardiomyopathy    resolved  . Chest pain    chronicc  . CHF (congestive heart failure) ([place])   . Chronic back pain   . Chronic diarrhea   . Concussion    sept 28th 2014  . Family history of breast cancer   . Family history of colon cancer   . Gastroparesis   . GERD (gastroesophageal reflux disease)   . HTN (hypertension)   . Hyperlipidemia   . Hypothyroidism   . Mitral valve prolapse   . Nondiabetic gastroparesis   . PONV (postoperative nausea and vomiting)   . QT prolongation   . Tobacco abuse    down to 2 cigarettes per day  . Vertigo    Past Surgical History:  Procedure Laterality Date  . ABDOMINAL HYSTERECTOMY    . CARDIAC CATHETERIZATION  03/2007   showed 60% lesion in the right coronary artery  . CHOLECYSTECTOMY    . LEFT HEART CATHETERIZATION WITH CORONARY ANGIOGRAM N/A 04/13/2012   Procedure: LEFT HEART CATHETERIZATION WITH CORONARY ANGIOGRAM;  Surgeon: Hillary Bow, MD;  Location: [facility] CATH LAB;  Service: Cardiovascular;  Laterality: N/A;  . LUMBAR LAMINECTOMY/DECOMPRESSION MICRODISCECTOMY Left 12/20/2018   Procedure: Left Lumbar Three-Four Lumbar Laminotomy and Foraminotomy, Lumbar Four-Five Laminotomy and Foraminotomy with Microdiscectomy and Resection of Synovial Cyst;  Surgeon: Jovita Gamma, MD;  Location: [place];  Service: Neurosurgery;  Laterality: Left;  Left Lumbar 3-4 Lumbar laminotomy, foraminotomy, possible microdiscectomy with possible resection of synovial cyst  . SPINE SURGERY    .  VARICOSE VEIN SURGERY    . VESICOVAGINAL FISTULA CLOSURE W/ TAH       Current Meds  Medication Sig  . albuterol (VENTOLIN HFA) 108 (90 Base) MCG/ACT inhaler Inhale 1-2 puffs into the lungs every 6 (six) hours as needed for wheezing or shortness of breath.   Marland Kitchen amLODipine (NORVASC) 10 MG tablet Take 10 mg by mouth daily.   Marland Kitchen aspirin 81 MG chewable tablet Chew 81 mg by mouth daily.   Marland Kitchen buPROPion (WELLBUTRIN SR) 150 MG 12 hr tablet Take 150 mg by mouth 2 (two) times daily.  . cholecalciferol (VITAMIN D) 1000 UNITS tablet Take 1,000 Units by mouth daily.  Marland Kitchen gabapentin (NEURONTIN) 600 MG tablet Take 600 mg by mouth 4 (four) times daily.  . hydrocortisone (CORTEF) 10 MG tablet Take 10-20 mg by mouth See admin instructions. Takes 20 mg in the morning and 10 mg at night  . levothyroxine (SYNTHROID, LEVOTHROID) 75 MCG tablet Take 75 mcg by mouth daily before breakfast.  . lidocaine (  LIDODERM) 5 % Place 1 patch onto the skin daily. Remove & Discard patch within 12 hours or as directed by MD  . meclizine (ANTIVERT) 25 MG tablet 1 or 2 tabs PO q8h prn dizziness (Patient taking differently: Take 25-50 mg by mouth 3 (three) times daily as needed (for vertigo).)  . nitroGLYCERIN (NITROSTAT) 0.4 MG SL tablet Place 1 tablet (0.4 mg total) under the tongue every 5 (five) minutes as needed for chest pain.  Marland Kitchen ondansetron (ZOFRAN ODT) 4 MG disintegrating tablet Take 1 tablet (4 mg total) by mouth every 8 (eight) hours as needed for nausea.  Marland Kitchen oxyCODONE (ROXICODONE) 15 MG immediate release tablet Take 15 mg by mouth every 4 (four) hours as needed for pain.   . polyethylene glycol (MIRALAX / GLYCOLAX) 17 g packet Take 17 g by mouth daily. (Patient taking differently: Take 17 g by mouth as needed.)  . zolpidem (AMBIEN) 10 MG tablet Take 10 mg by mouth at bedtime.  . [DISCONTINUED] cyclobenzaprine (FLEXERIL) 10 MG tablet Take 10 mg by mouth 3 (three) times daily as needed for muscle spasms.      Allergies:   Bee  venom, Doxycycline, Erythromycin, Ibuprofen, Penicillins, Sulfa antibiotics, Cat hair extract, Dust mite extract, Tramadol, and Morphine and related   Social History   Tobacco Use  . Smoking status: Current Every Day Smoker    Packs/day: 0.25    Years: 10.00    Pack years: 2.50    Types: Cigarettes  . Smokeless tobacco: Never Used  . Tobacco comment: smokes 2 a day  Vaping Use  . Vaping Use: Former  Substance Use Topics  . Alcohol use: No  . Drug use: No    Comment: Hisrory of Cocaine use      Family Hx: The patient's family history includes Breast cancer (age of onset: 33) in her paternal uncle; Cancer in her father; Coronary artery disease in an other family member; Dementia in her maternal grandmother; Heart attack in her mother; Leukemia (age of onset: 66) in her maternal uncle.  ROS:   Pertinent items noted in HPI and remainder of comprehensive ROS otherwise negative.   Prior CV studies:   The following studies were reviewed today:  Myoview 01/08/2020  Nuclear stress EF: 59%.  The left ventricular ejection fraction is normal (55-65%).  There was no ST segment deviation noted during stress.  Defect 1: There is a medium defect of moderate severity present in the mid anterior and apical anterior location.  This is a low risk study.   Mildly abnormal, low risk stress nuclear study with probable shifting breast attenuation.  Cannot rule out very mild anterior ischemia.  Gated ejection fraction 59% with normal wall motion.   Labs/Other Tests and Data Reviewed:    EKG:  An ECG dated 01/11/2020 was personally reviewed today and demonstrated:  Sinus rhythm without significant ST-T wave changes.  Recent Labs: 01/11/2020: ALT 13; BUN 21; Creatinine, Ser 1.08; Hemoglobin 14.0; Magnesium 1.6; Platelets 356; Potassium 3.1; Sodium 138   Recent Lipid Panel Lab Results  Component Value Date/Time   CHOL 183 01/01/2009 01:03 PM   TRIG 112.0 01/01/2009 01:03 PM   HDL 31.20 (L)  01/01/2009 01:03 PM   CHOLHDL 6 01/01/2009 01:03 PM   LDLCALC 129 (H) 01/01/2009 01:03 PM    Wt Readings from Last 3 Encounters:  09/17/20 192 lb (87.1 kg)  07/06/20 169 lb 12.1 oz (77 kg)  07/06/20 170 lb (77.1 kg)     Objective:  Vital Signs:  BP (!) 118/91   Pulse 74   Ht 5\' 5"  (1.651 m)   Wt 192 lb (87.1 kg)   BMI 31.95 kg/m    VITAL SIGNS:  reviewed GEN:  no acute distress RESPIRATORY:  normal respiratory effort, symmetric expansion SKIN:  no rash, lesions or ulcers. NEURO:  alert and oriented x 3, no obvious focal deficit PSYCH:  normal affect .  ASSESSMENT & PLAN:    1. Edema: She reports significant weight gain and bilateral lower extremity edema.  We will order an echocardiogram and start Lasix 20 mg daily.  Check metabolic profile in 1 to 2 weeks.  She does have a history of Addison's disease on steroids.  2. History of chest pain: Previous cardiac catheterization in 2013 showed no significant obstructive disease.  Last Myoview obtained in May 2021 was low risk.  Mild recent chest discomfort.  Given her stress testing in May, will not recommend further ischemic evaluation.  This may be due to volume overload.  3. Hypertension: Continue the current therapy  4. Hyperlipidemia: will defer annual lipid panel to PCP.  5. Tobacco abuse: will prescribe Chantix.  COVID-19 Education: The signs and symptoms of COVID-19 were discussed with the patient and how to seek care for testing (follow up with PCP or arrange E-visit).  The importance of social distancing was discussed today.  Time:   Today, I have spent 25 minutes with the patient with telehealth technology discussing the above problems.     Medication Adjustments/Labs and Tests Ordered: Current medicines are reviewed at length with the patient today.  Concerns regarding medicines are outlined above.   Tests Ordered: No orders of the defined types were placed in this encounter.   Medication Changes: No  orders of the defined types were placed in this encounter.   Follow Up:  In Person in 1 month(s)  Pixie Casino, MD, FACC, [place] Director of the [facility] Diplomate of the American Board of Clinical Lipidology Attending Cardiologist  Direct Dial: [phone]  Fax: [phone]  Website:  [email or web address]  Pixie Casino, MD  09/17/2020 1:45 PM

## 2020-09-17 NOTE — Telephone Encounter (Signed)
Thanks .. I agree with those recommendations.  Dr. H 

## 2020-09-17 NOTE — Telephone Encounter (Signed)
Virtual visit completed with MD on 09/17/2020

## 2020-09-17 NOTE — Telephone Encounter (Signed)
Patient called to review e-visit instructions.  Patient aware that the following changes have been made: START: 20mg  LASIX DAILY Patient aware that they will need the following labs: BMET in [place]  Patient aware that they will need the following test(s): Echocardiogram  Patient needs 1 month follow up with Dr. Debara Pickett or APP- Staff message sent to scheduling   No further assistance needed at this time.   Patient verbalized understanding of all instructions.

## 2020-09-17 NOTE — Patient Instructions (Addendum)
Medication Instructions:  START LASIX 20mg  (1 tablet) DAILY  *If you need a refill on your cardiac medications before your next appointment, please call your pharmacy*  Lab Work: BMET- IN 1 WEEK If you have labs (blood work) drawn today and your tests are completely normal, you will receive your results only by: Marland Kitchen MyChart Message (if you have MyChart) OR . A paper copy in the mail If you have any lab test that is abnormal or we need to change your treatment, we will call you to review the results.  Testing/Procedures: Your physician has requested that you have an echocardiogram. Echocardiography is a painless test that uses sound waves to create images of your heart. It provides your doctor with information about the size and shape of your heart and how well your heart's chambers and valves are working. You may receive an ultrasound enhancing agent through an IV if needed to better visualize your heart during the echo.This procedure takes approximately one hour. There are no restrictions for this procedure. This will take place at the [address], [address].   Follow-Up: At [facility], you and your health needs are our priority.  As part of our continuing mission to provide you with exceptional heart care, we have created designated Provider Care Teams.  These Care Teams include your primary Cardiologist (physician) and Advanced Practice Providers (APPs -  Physician Assistants and Nurse Practitioners) who all work together to provide you with the care you need, when you need it.  Your next appointment:   1 MONTH   The format for your next appointment:   In Person  Provider:   K. Mali Hilty, MD

## 2020-09-26 ENCOUNTER — Other Ambulatory Visit: Payer: Medicare HMO | Admitting: *Deleted

## 2020-09-26 ENCOUNTER — Ambulatory Visit
Admission: RE | Admit: 2020-09-26 | Discharge: 2020-09-26 | Disposition: A | Discharge status: Home / Self Care | Payer: Medicare HMO | Source: Ambulatory Visit | Attending: Neurology | Admitting: Neurology

## 2020-09-26 ENCOUNTER — Other Ambulatory Visit: Payer: Self-pay

## 2020-09-26 DIAGNOSIS — M5116 Intervertebral disc disorders with radiculopathy, lumbar region: Secondary | ICD-10-CM | POA: Diagnosis not present

## 2020-09-26 DIAGNOSIS — R600 Localized edema: Secondary | ICD-10-CM | POA: Diagnosis not present

## 2020-09-26 DIAGNOSIS — M5416 Radiculopathy, lumbar region: Secondary | ICD-10-CM

## 2020-09-26 LAB — BASIC METABOLIC PANEL
BUN/Creatinine Ratio: 19 (ref 9–23)
BUN: 24 mg/dL (ref 6–24)
CO2: 24 mmol/L (ref 20–29)
Calcium: 9.6 mg/dL (ref 8.7–10.2)
Chloride: 98 mmol/L (ref 96–106)
Creatinine, Ser: 1.24 mg/dL — ABNORMAL HIGH (ref 0.57–1.00)
GFR calc Af Amer: 56 mL/min/{1.73_m2} — ABNORMAL LOW (ref >59)
GFR calc non Af Amer: 49 mL/min/{1.73_m2} — ABNORMAL LOW (ref >59)
Glucose: 89 mg/dL (ref 65–99)
Potassium: 4.7 mmol/L (ref 3.5–5.2)
Sodium: 138 mmol/L (ref 134–144)

## 2020-09-26 MED ORDER — METHYLPREDNISOLONE ACETATE 40 MG/ML INJ SUSP (RADIOLOG
120.0000 mg | Freq: Once | INTRAMUSCULAR | Status: AC
  Administered 2020-09-26: 120 mg via EPIDURAL

## 2020-09-26 MED ORDER — IOPAMIDOL (ISOVUE-M 200) INJECTION 41%
1.0000 mL | Freq: Once | INTRAMUSCULAR | Status: AC
  Administered 2020-09-26: 1 mL via EPIDURAL

## 2020-09-26 NOTE — Discharge Instructions (Signed)

## 2020-10-01 ENCOUNTER — Telehealth: Payer: Self-pay | Admitting: Internal Medicine

## 2020-10-01 NOTE — Telephone Encounter (Signed)
Patient calling for lab results. She states she tried getting them with mychart but it was not working.

## 2020-10-01 NOTE — Telephone Encounter (Signed)
Left a message for the patient to call back.  

## 2020-10-01 NOTE — Telephone Encounter (Signed)
Follow Up:      Pt is returning Lattie Haw, concerning her results.

## 2020-10-01 NOTE — Telephone Encounter (Signed)
Patient made aware of lab results with verbalized understanding. Your creatinine is up some, but has been up and down. Potassium is normal. Continue current therapy.

## 2020-10-07 ENCOUNTER — Other Ambulatory Visit (HOSPITAL_COMMUNITY): Payer: Self-pay | Admitting: Internal Medicine

## 2020-10-07 ENCOUNTER — Other Ambulatory Visit: Payer: Self-pay | Admitting: Internal Medicine

## 2020-10-07 DIAGNOSIS — M545 Low back pain, unspecified: Secondary | ICD-10-CM

## 2020-10-08 ENCOUNTER — Ambulatory Visit (HOSPITAL_COMMUNITY): Payer: Medicare HMO | Attending: Cardiology

## 2020-10-08 ENCOUNTER — Other Ambulatory Visit: Payer: Self-pay

## 2020-10-08 DIAGNOSIS — I700 Atherosclerosis of aorta: Secondary | ICD-10-CM | POA: Diagnosis not present

## 2020-10-08 DIAGNOSIS — E063 Autoimmune thyroiditis: Secondary | ICD-10-CM | POA: Diagnosis not present

## 2020-10-08 DIAGNOSIS — Z6827 Body mass index (BMI) 27.0-27.9, adult: Secondary | ICD-10-CM | POA: Diagnosis not present

## 2020-10-08 DIAGNOSIS — G894 Chronic pain syndrome: Secondary | ICD-10-CM | POA: Diagnosis not present

## 2020-10-08 DIAGNOSIS — K219 Gastro-esophageal reflux disease without esophagitis: Secondary | ICD-10-CM | POA: Diagnosis not present

## 2020-10-08 DIAGNOSIS — E273 Drug-induced adrenocortical insufficiency: Secondary | ICD-10-CM | POA: Diagnosis not present

## 2020-10-08 DIAGNOSIS — R600 Localized edema: Secondary | ICD-10-CM | POA: Insufficient documentation

## 2020-10-08 DIAGNOSIS — R69 Illness, unspecified: Secondary | ICD-10-CM | POA: Diagnosis not present

## 2020-10-08 DIAGNOSIS — J45909 Unspecified asthma, uncomplicated: Secondary | ICD-10-CM | POA: Diagnosis not present

## 2020-10-08 DIAGNOSIS — I10 Essential (primary) hypertension: Secondary | ICD-10-CM | POA: Diagnosis not present

## 2020-10-08 DIAGNOSIS — Z1331 Encounter for screening for depression: Secondary | ICD-10-CM | POA: Diagnosis not present

## 2020-10-08 LAB — ECHOCARDIOGRAM COMPLETE
Area-P 1/2: 2.43 cm2
S' Lateral: 3.5 cm

## 2020-10-20 ENCOUNTER — Other Ambulatory Visit: Payer: Self-pay

## 2020-10-20 ENCOUNTER — Ambulatory Visit (HOSPITAL_COMMUNITY)
Admission: RE | Admit: 2020-10-20 | Discharge: 2020-10-20 | Disposition: A | Discharge status: Home / Self Care | Payer: Medicare HMO | Source: Ambulatory Visit | Attending: Internal Medicine | Admitting: Internal Medicine

## 2020-10-20 DIAGNOSIS — M545 Low back pain, unspecified: Secondary | ICD-10-CM | POA: Insufficient documentation

## 2020-10-20 NOTE — Progress Notes (Signed)
Cardiology Office Note:    Date:  10/21/2020   ID:  [name], DOB [birth year], MRN [id number]  PCP:  Redmond School, MD  Cardiologist:  Pixie Casino, MD   Referring MD: Redmond School, MD   Chief Complaint  Patient presents with  . Leg Swelling    History of Present Illness:    [name] is a 55 y.o. female with a hx of HTN, CAD, HLD, GERD, hypothyroidism, Addison's disease, palpitations, and tobacco abuse. Heart cath in 2013 with no significant CAD. Echo 2016 with normal EF and grade 1 DD. She was admitted in Dec 2020 with pleuritic chest pain, headache, dizziness, and fever with T-max 102.9, intractable emesis, and AKI. She had COVID-19 exposure but tested negative. Myoview 01/08/20 with breast attenuation but no ischemia, low risk study. She was seen by Dr. Debara Pickett 09/17/20 virtually and reported worsening swelling and SOB with a 10 lbs weight gain. She did not go to the ER because she cares for her paraplegic daughter. Dr. Debara Pickett started lasix 20 mg and ordered echo. She is on steroids for addisons disease.   Echo showed normal EF of 55-60%, grade 1 DD, normal RV function, no significant valvular disease, no wall motion abnormality. Repeat BMP after starting 20 mg lasix showed sCr 1.24 - she has a history of fluctuation 1.01 - 1.3, K was 4.7.   She does take 10 mg amlodipine along with ASA.   She presents today for follow up. She is here with a walker, which she doesn't use, but she fell three times this morning just trying to get to this appt. She denies head injury. She reports the 20 mg lasix was working for a period of time, but she states the swelling is worse especially when she doesn't elevate her feet. She is also short of breath x 3 days with the increased swelling - swelling up to her buttocks. She wears a back brace for hx of scoliosis. Her BP has also been elevated, PCP started her on HCTZ 12.5 mg. Legs feel heavy and she is not sleeping, very fatigued. She reports  orthopnea over the last three days. She also reports palpitations 2-3 times daily. Palpitations increase with increase in volume.     Past Medical History:  Diagnosis Date  . Addison's disease ([name])   . Adrenal insufficiency ([name])    diagnosed 2012  . Aneurysm ([name])   . Anxiety   . Arthritis   . Astigmatism   . CAD (coronary artery disease)    Cath 2008 EF normal. RCA 50-60, Septal 50%. Myoview 3/12: EF 53% normal perfusion  . Cardiac arrest ([name])    2/2 adissonian crisis  . Cardiomyopathy    resolved  . Chest pain    chronicc  . CHF (congestive heart failure) ([name])   . Chronic back pain   . Chronic diarrhea   . Concussion    sept 28th 2014  . Family history of breast cancer   . Family history of colon cancer   . Gastroparesis   . GERD (gastroesophageal reflux disease)   . HTN (hypertension)   . Hyperlipidemia   . Hypothyroidism   . Mitral valve prolapse   . Nondiabetic gastroparesis   . PONV (postoperative nausea and vomiting)   . QT prolongation   . Tobacco abuse    down to 2 cigarettes per day  . Vertigo     Past Surgical History:  Procedure Laterality Date  . ABDOMINAL HYSTERECTOMY    .  CARDIAC CATHETERIZATION  03/2007   showed 60% lesion in the right coronary artery  . CHOLECYSTECTOMY    . LEFT HEART CATHETERIZATION WITH CORONARY ANGIOGRAM N/A 04/13/2012   Procedure: LEFT HEART CATHETERIZATION WITH CORONARY ANGIOGRAM;  Surgeon: Joy Bow, MD;  Location: [facility] CATH LAB;  Service: Cardiovascular;  Laterality: N/A;  . LUMBAR LAMINECTOMY/DECOMPRESSION MICRODISCECTOMY Left 12/20/2018   Procedure: Left Lumbar Three-Four Lumbar Laminotomy and Foraminotomy, Lumbar Four-Five Laminotomy and Foraminotomy with Microdiscectomy and Resection of Synovial Cyst;  Surgeon: Joy Gamma, MD;  Location: [place];  Service: Neurosurgery;  Laterality: Left;  Left Lumbar 3-4 Lumbar laminotomy, foraminotomy, possible microdiscectomy with possible resection of synovial cyst  . SPINE  SURGERY    . VARICOSE VEIN SURGERY    . VESICOVAGINAL FISTULA CLOSURE W/ TAH      Current Medications: Current Meds  Medication Sig  . albuterol (VENTOLIN HFA) 108 (90 Base) MCG/ACT inhaler Inhale 1-2 puffs into the lungs every 6 (six) hours as needed for wheezing or shortness of breath.   [name] amLODipine (NORVASC) 10 MG tablet Take 10 mg by mouth daily.   [name] aspirin 81 MG chewable tablet Chew 81 mg by mouth daily.   [name] buPROPion (WELLBUTRIN SR) 150 MG 12 hr tablet Take 150 mg by mouth 2 (two) times daily.  . cholecalciferol (VITAMIN D) 1000 UNITS tablet Take 1,000 Units by mouth daily.  . furosemide (LASIX) 20 MG tablet Take 1 tablet (20 mg total) by mouth daily.  [name] gabapentin (NEURONTIN) 600 MG tablet Take 600 mg by mouth 4 (four) times daily.  . hydrALAZINE (APRESOLINE) 25 MG tablet Take 1 tablet (25 mg total) by mouth 2 (two) times daily.  . hydrocortisone (CORTEF) 10 MG tablet Take 10-20 mg by mouth See admin instructions. Takes 20 mg in the morning and 10 mg at night  . levothyroxine (SYNTHROID, LEVOTHROID) 75 MCG tablet Take 75 mcg by mouth daily before breakfast.  . meclizine (ANTIVERT) 25 MG tablet 1 or 2 tabs PO q8h prn dizziness (Patient taking differently: Take 25-50 mg by mouth 3 (three) times daily as needed (for vertigo).)  . nitroGLYCERIN (NITROSTAT) 0.4 MG SL tablet Place 1 tablet (0.4 mg total) under the tongue every 5 (five) minutes as needed for chest pain.  [name] ondansetron (ZOFRAN ODT) 4 MG disintegrating tablet Take 1 tablet (4 mg total) by mouth every 8 (eight) hours as needed for nausea.  [name] oxyCODONE (ROXICODONE) 15 MG immediate release tablet Take 15 mg by mouth every 4 (four) hours as needed for pain.   [name] zolpidem (AMBIEN) 10 MG tablet Take 10 mg by mouth at bedtime.     Allergies:   Bee venom, Doxycycline, Erythromycin, Ibuprofen, Penicillins, Sulfa antibiotics, Cat hair extract, Dust mite extract, Tramadol, and Morphine and related   Social History   Socioeconomic  History  . Marital status: Single    Spouse name: Not on file  . Number of children: Not on file  . Years of education: Not on file  . Highest education level: Not on file  Occupational History  . Not on file  Tobacco Use  . Smoking status: Current Every Day Smoker    Packs/day: 0.25    Years: 10.00    Pack years: 2.50    Types: Cigarettes  . Smokeless tobacco: Never Used  . Tobacco comment: smokes 2 a day  Vaping Use  . Vaping Use: Former  Substance and Sexual Activity  . Alcohol use: No  . Drug use: No  Comment: Hisrory of Cocaine use   . Sexual activity: Never    Birth control/protection: Surgical  Other Topics Concern  . Not on file  Social History Narrative   Lives in [place] with daughter and son. She is on disability.    Social Determinants of Health   Financial Resource Strain: Not on file  Food Insecurity: Not on file  Transportation Needs: Not on file  Physical Activity: Not on file  Stress: Not on file  Social Connections: Not on file     Family History: The patient's family history includes Breast cancer (age of onset: 47) in her paternal uncle; Cancer in her father; Coronary artery disease in an other family member; Dementia in her maternal grandmother; Heart attack in her mother; Leukemia (age of onset: 73) in her maternal uncle.  ROS:   Please see the history of present illness.     All other systems reviewed and are negative.  EKGs/Labs/Other Studies Reviewed:    The following studies were reviewed today:  Echo 10/08/20: 1. Left ventricular ejection fraction, by estimation, is 55 to 60%. The  left ventricle has normal function. The left ventricle has no regional  wall motion abnormalities. Left ventricular diastolic parameters are  consistent with Grade I diastolic  dysfunction (impaired relaxation).  2. Right ventricular systolic function is normal. The right ventricular  size is normal. There is normal pulmonary artery systolic pressure.  The  estimated right ventricular systolic pressure is 62.8 mmHg.  3. Left atrial size was mildly dilated.  4. The mitral valve is normal in structure. Trivial mitral valve  regurgitation. No evidence of mitral stenosis.  5. The aortic valve is tricuspid. Aortic valve regurgitation is not  visualized. Mild aortic valve sclerosis is present, with no evidence of  aortic valve stenosis.  6. The inferior vena cava is normal in size with greater than 50%  respiratory variability, suggesting right atrial pressure of 3 mmHg.   EKG:  EKG is not ordered today.  Recent Labs: 01/11/2020: ALT 13; Hemoglobin 14.0; Magnesium 1.6; Platelets 356 09/26/2020: BUN 24; Creatinine, Ser 1.24; Potassium 4.7; Sodium 138  Recent Lipid Panel    Component Value Date/Time   CHOL 183 01/01/2009 1303   TRIG 112.0 01/01/2009 1303   HDL 31.20 (L) 01/01/2009 1303   CHOLHDL 6 01/01/2009 1303   VLDL 22.4 01/01/2009 1303   LDLCALC 129 (H) 01/01/2009 1303    Physical Exam:    VS:  BP 132/70   Pulse 70   Ht 5\' 5"  (1.651 m)   Wt 178 lb (80.7 kg)   SpO2 93%   BMI 29.62 kg/m     Wt Readings from Last 3 Encounters:  10/21/20 178 lb (80.7 kg)  09/17/20 192 lb (87.1 kg)  07/06/20 169 lb 12.1 oz (77 kg)     GEN:  Well nourished, well developed in no acute distress HEENT: Normal NECK: No JVD; No carotid bruits LYMPHATICS: No lymphadenopathy CARDIAC: RRR, no murmurs, rubs, gallops RESPIRATORY:  Clear to auscultation without rales, wheezing or rhonchi  ABDOMEN: Soft, non-tender, non-distended MUSCULOSKELETAL:  No edema; No deformity  SKIN: Warm and dry NEUROLOGIC:  Alert and oriented x 3 PSYCHIATRIC:  Normal affect   ASSESSMENT:    1. Bilateral leg edema   2. Acute on chronic diastolic heart failure (HCC)   3. Palpitations   4. Essential hypertension   5. Chest pain of uncertain etiology   6. Tobacco abuse    PLAN:    In order of  problems listed above:  Lower extremity swelling Acute on chronic  diastolic heart failure - 20 mg lasix reduced weight, but she is back up with increased swelling --> will increase this to 40 mg daily x 3 days, then back to 20 mg daily - need to be cautious with Addison's disease - I would like her to touch base with her endocrinologist Dr. Chalmers Cater with the increase in lasix   Palpitations - RRR on exam - 14 day zio patch    Hypertension  - 10 mg amlodipine - PCP started HCTZ 6.25 mg - will D/C this - will start hydralazine 25 mg BID - if swelling occurs again, may need to stop amlodipine - if palpitations persist, may need to add coreg for palpitations and BP control   Chest pain - recent myoview nonischemic - continue ASA - reports chest tightness related to shortness of breath, suspect this is related to her fluid status   Smoker - she is working on quitting  Follow up with Dr. Debara Pickett in 2 weeks.  Follow up in 6 weeks after heart monitor.   She needs sleep study - will hold off on this until volume status is stable and orthopnea has resolved.   Medication Adjustments/Labs and Tests Ordered: Current medicines are reviewed at length with the patient today.  Concerns regarding medicines are outlined above.  Orders Placed This Encounter  Procedures  . LONG TERM MONITOR (3-14 DAYS)   Meds ordered this encounter  Medications  . hydrALAZINE (APRESOLINE) 25 MG tablet    Sig: Take 1 tablet (25 mg total) by mouth 2 (two) times daily.    Dispense:  180 tablet    Refill:  1    Signed, Ledora Bottcher, [place]  10/21/2020 5:00 PM    Anna

## 2020-10-21 ENCOUNTER — Encounter: Payer: Self-pay | Admitting: Radiology

## 2020-10-21 ENCOUNTER — Encounter: Payer: Self-pay | Admitting: Physician Assistant

## 2020-10-21 ENCOUNTER — Ambulatory Visit (INDEPENDENT_AMBULATORY_CARE_PROVIDER_SITE_OTHER): Payer: Medicare HMO

## 2020-10-21 ENCOUNTER — Ambulatory Visit: Payer: Medicare HMO | Admitting: Physician Assistant

## 2020-10-21 VITALS — BP 132/70 | HR 70 | Ht 65.0 in | Wt 178.0 lb

## 2020-10-21 DIAGNOSIS — I5033 Acute on chronic diastolic (congestive) heart failure: Secondary | ICD-10-CM | POA: Diagnosis not present

## 2020-10-21 DIAGNOSIS — R002 Palpitations: Secondary | ICD-10-CM

## 2020-10-21 DIAGNOSIS — Z720 Tobacco use: Secondary | ICD-10-CM | POA: Diagnosis not present

## 2020-10-21 DIAGNOSIS — I10 Essential (primary) hypertension: Secondary | ICD-10-CM | POA: Diagnosis not present

## 2020-10-21 DIAGNOSIS — R600 Localized edema: Secondary | ICD-10-CM

## 2020-10-21 DIAGNOSIS — R079 Chest pain, unspecified: Secondary | ICD-10-CM | POA: Diagnosis not present

## 2020-10-21 MED ORDER — HYDRALAZINE HCL 25 MG PO TABS
25.0000 mg | ORAL_TABLET | Freq: Two times a day (BID) | ORAL | 1 refills | Status: DC
Start: 2020-10-21 — End: 2020-11-13

## 2020-10-21 NOTE — Progress Notes (Signed)
Enrolled patient for a 14 day Zio XT Monitor to be mailed to patients home  

## 2020-10-21 NOTE — Patient Instructions (Signed)
Medication Instructions:  Stop HCTZ.  Increase Lasix to 40 mg for (3)Three Days., Then Continue Lasix 20 mg. Start Hydralazine 25 mg (1 Tablet Twice Daily) *If you need a refill on your cardiac medications before your next appointment, please call your pharmacy*   Lab Work:  If you have labs (blood work) drawn today and your tests are completely normal, you will receive your results only by: Marland Kitchen MyChart Message (if you have MyChart) OR . A paper copy in the mail If you have any lab test that is abnormal or we need to change your treatment, we will call you to review the results.   Testing/Procedures: [facility]- Long Term Monitor Instructions   Your physician has requested you wear your ZIO patch monitor__14____days.   This is a single patch monitor.  [employer] supplies one patch monitor per enrollment.  Additional stickers are not available.   Please do not apply patch if you will be having a Nuclear Stress Test, Echocardiogram, Cardiac CT, MRI, or Chest Xray during the time frame you would be wearing the monitor. The patch cannot be worn during these tests.  You cannot remove and re-apply the ZIO XT patch monitor.   Your ZIO patch monitor will be sent USPS Priority mail from [facility] directly to your home address. The monitor may also be mailed to a PO BOX if home delivery is not available.   It may take 3-5 days to receive your monitor after you have been enrolled.   Once you have received you monitor, please review enclosed instructions.  Your monitor has already been registered assigning a specific monitor serial # to you.   Applying the monitor   Shave hair from upper left chest.   Hold abrader disc by orange tab.  Rub abrader in 40 strokes over left upper chest as indicated in your monitor instructions.   Clean area with 4 enclosed alcohol pads .  Use all pads to assure are is cleaned thoroughly.  Let dry.   Apply patch as indicated in monitor instructions.  Patch will be  place under collarbone on left side of chest with arrow pointing upward.   Rub patch adhesive wings for 2 minutes.Remove white label marked "1".  Remove white label marked "2".  Rub patch adhesive wings for 2 additional minutes.   While looking in a mirror, press and release button in center of patch.  A small green light will flash 3-4 times .  This will be your only indicator the monitor has been turned on.     Do not shower for the first 24 hours.  You may shower after the first 24 hours.   Press button if you feel a symptom. You will hear a small click.  Record Date, Time and Symptom in the Patient Log Book.   When you are ready to remove patch, follow instructions on last 2 pages of Patient Log Book.  Stick patch monitor onto last page of Patient Log Book.   Place Patient Log Book in [place] box.  Use locking tab on box and tape box closed securely.  The [employer] has [employer] on it.  Please place in mailbox as soon as possible.  Your physician should have your test results approximately 7 days after the monitor has been mailed back to [facility].   Call [facility] at [phone] if you have questions regarding your ZIO XT patch monitor.  Call them immediately if you see an orange  light blinking on your monitor.   If your monitor falls off in less than 4 days contact our Monitor department at [phone].  If your monitor becomes loose or falls off after 4 days call [employer] at [phone] for suggestions on securing your monitor.     Follow-Up: At [facility], you and your health needs are our priority.  As part of our continuing mission to provide you with exceptional heart care, we have created designated Provider Care Teams.  These Care Teams include your primary Cardiologist (physician) and Advanced Practice Providers (APPs -  Physician Assistants and Nurse Practitioners) who all work together to provide you with the care you need, when you  need it.    Your next appointment:   2 week(s)  The format for your next appointment:   In Person  Provider:   K. Mali Hilty, MD   Other Instructions Monitor BP Daily at 10:30 AM after Breakfast and Morning Breakfast.

## 2020-10-23 DIAGNOSIS — R251 Tremor, unspecified: Secondary | ICD-10-CM | POA: Diagnosis not present

## 2020-10-23 DIAGNOSIS — G35 Multiple sclerosis: Secondary | ICD-10-CM | POA: Diagnosis not present

## 2020-10-23 DIAGNOSIS — R252 Cramp and spasm: Secondary | ICD-10-CM | POA: Diagnosis not present

## 2020-10-23 DIAGNOSIS — M542 Cervicalgia: Secondary | ICD-10-CM | POA: Diagnosis not present

## 2020-10-23 DIAGNOSIS — M5416 Radiculopathy, lumbar region: Secondary | ICD-10-CM | POA: Diagnosis not present

## 2020-10-23 DIAGNOSIS — M545 Low back pain, unspecified: Secondary | ICD-10-CM | POA: Diagnosis not present

## 2020-10-27 ENCOUNTER — Other Ambulatory Visit: Payer: Self-pay | Admitting: Neurology

## 2020-10-29 DIAGNOSIS — R002 Palpitations: Secondary | ICD-10-CM

## 2020-10-29 DIAGNOSIS — R600 Localized edema: Secondary | ICD-10-CM

## 2020-10-30 ENCOUNTER — Other Ambulatory Visit: Payer: Self-pay | Admitting: Neurology

## 2020-10-30 ENCOUNTER — Other Ambulatory Visit (HOSPITAL_COMMUNITY): Payer: Self-pay | Admitting: Neurology

## 2020-10-30 DIAGNOSIS — G35 Multiple sclerosis: Secondary | ICD-10-CM

## 2020-11-02 DIAGNOSIS — S91332A Puncture wound without foreign body, left foot, initial encounter: Secondary | ICD-10-CM | POA: Diagnosis not present

## 2020-11-04 ENCOUNTER — Other Ambulatory Visit (HOSPITAL_COMMUNITY): Payer: Self-pay | Admitting: Internal Medicine

## 2020-11-04 DIAGNOSIS — I10 Essential (primary) hypertension: Secondary | ICD-10-CM | POA: Diagnosis not present

## 2020-11-04 DIAGNOSIS — M79672 Pain in left foot: Secondary | ICD-10-CM

## 2020-11-04 DIAGNOSIS — G894 Chronic pain syndrome: Secondary | ICD-10-CM | POA: Diagnosis not present

## 2020-11-04 DIAGNOSIS — M25551 Pain in right hip: Secondary | ICD-10-CM | POA: Diagnosis not present

## 2020-11-07 ENCOUNTER — Other Ambulatory Visit: Payer: Self-pay

## 2020-11-07 ENCOUNTER — Ambulatory Visit (HOSPITAL_COMMUNITY)
Admission: RE | Admit: 2020-11-07 | Discharge: 2020-11-07 | Disposition: A | Discharge status: Home / Self Care | Payer: Medicare HMO | Source: Ambulatory Visit | Attending: Internal Medicine | Admitting: Internal Medicine

## 2020-11-07 DIAGNOSIS — M795 Residual foreign body in soft tissue: Secondary | ICD-10-CM | POA: Diagnosis not present

## 2020-11-07 DIAGNOSIS — M79672 Pain in left foot: Secondary | ICD-10-CM | POA: Insufficient documentation

## 2020-11-07 DIAGNOSIS — M25551 Pain in right hip: Secondary | ICD-10-CM | POA: Insufficient documentation

## 2020-11-07 DIAGNOSIS — S99922A Unspecified injury of left foot, initial encounter: Secondary | ICD-10-CM | POA: Diagnosis not present

## 2020-11-13 ENCOUNTER — Other Ambulatory Visit: Payer: Self-pay

## 2020-11-13 ENCOUNTER — Encounter: Payer: Self-pay | Admitting: Internal Medicine

## 2020-11-13 ENCOUNTER — Ambulatory Visit: Payer: Medicare HMO | Admitting: Internal Medicine

## 2020-11-13 VITALS — BP 142/84 | HR 70 | Ht 65.5 in | Wt 178.0 lb

## 2020-11-13 DIAGNOSIS — I10 Essential (primary) hypertension: Secondary | ICD-10-CM | POA: Diagnosis not present

## 2020-11-13 DIAGNOSIS — R002 Palpitations: Secondary | ICD-10-CM | POA: Diagnosis not present

## 2020-11-13 DIAGNOSIS — R600 Localized edema: Secondary | ICD-10-CM | POA: Diagnosis not present

## 2020-11-13 DIAGNOSIS — Z79899 Other long term (current) drug therapy: Secondary | ICD-10-CM

## 2020-11-13 MED ORDER — FUROSEMIDE 20 MG PO TABS: 20.0000 mg | ORAL_TABLET | Freq: Two times a day (BID) | ORAL | 11 refills | Status: DC

## 2020-11-13 MED ORDER — HYDRALAZINE HCL 25 MG PO TABS: 25.0000 mg | ORAL_TABLET | Freq: Three times a day (TID) | ORAL | 1 refills | Status: DC

## 2020-11-13 NOTE — Progress Notes (Signed)
OFFICE NOTE  Chief Complaint:  Hospital follow-up, fatigue, neck pain  Primary Care Physician: Redmond School, MD  HPI:  [name] is a 55 yo female with a past medical history significant for hypertension, dyslipidemia, tobacco abuse and chest pain in the past without any known coronary artery disease. In 2013, she underwent left heart catheterization which showed preserved LV function and no significant obstructive coronary disease. Previously it had been reported that there was a 50-60% stenosis of the mid RCA which was noted to be completely resolved in 2013, favoring the diagnosis of coronary spasm. She also has a history of Addison's disease, hypothyroidism, prolonged QT interval and a remote history of nonischemic cardiomyopathy with EF 40-45%, improved and normalized recently. She was seen in consultation in 2014 for chest pressure. Her chest pain was felt to be noncardiac at that time and she had presented for a number of similar complaints prior to that. She now presents with heaviness and tightness in her chest. She reports the pain goes to her neck as well as her left shoulder, sharp and shooting, like a electric shock. The pain came on fairly suddenly. In the emergency department, workup including EKG (which was normal sinus rhythm, left posterior fascicular block and nonspecific T-wave changes) and troponins were negative 3.. The patient was noted to be chest pain-free, when examined by internal medicine.  [name] ruled out for acute MI and her symptoms were improved with muscle relaxants. I recommended starting low-dose calcium channel blocker because of the concern for possible spasm in the past. She had a repeat echo which shows an improvement in her EF up to 55-60%. Today she tells me her main concern is fatigue. She says she doesn't sleep very well and in fact the Ambien that used to help her to get to sleep is not working. She is tired throughout the day and  occasionally feels palpitations at night. His may be related to sleep apnea. She says that she's been told and is acting noted awaking herself up at night gasping for breath.  11/13/2020  [name] returns for follow-up.  I recently saw her via telemedicine visit.  She was having worsening lower extremity edema.  I increased her Lasix.  She had an echocardiogram which showed normal systolic function and very mild diastolic dysfunction.  She then had some worsening edema and was seen by Doreene Adas, PA-C in January.  Her Lasix was increased to 40 mg daily for 3 days and she was placed on hydralazine for uncontrolled hypertension 25 mg twice daily.  Blood pressure remained elevated and her PCP increased her hydralazine further to 25 mg 3 times daily.  She reports is now better controlled but generally between 409-811 systolic.  She continues to have some lower extremity edema.  She is only on 20 mg now daily of Lasix.  She is also taking amlodipine 10 mg daily.  PMHx:  Past Medical History:  Diagnosis Date  . Addison's disease ([place])   . Adrenal insufficiency ([place])    diagnosed 2012  . Aneurysm ([place])   . Anxiety   . Arthritis   . Astigmatism   . CAD (coronary artery disease)    Cath 2008 EF normal. RCA 50-60, Septal 50%. Myoview 3/12: EF 53% normal perfusion  . Cardiac arrest ([place])    2/2 adissonian crisis  . Cardiomyopathy    resolved  . Chest pain    chronicc  . CHF (congestive heart failure) ([place])   .  Chronic back pain   . Chronic diarrhea   . Concussion    sept 28th 2014  . Family history of breast cancer   . Family history of colon cancer   . Gastroparesis   . GERD (gastroesophageal reflux disease)   . HTN (hypertension)   . Hyperlipidemia   . Hypothyroidism   . Mitral valve prolapse   . Nondiabetic gastroparesis   . PONV (postoperative nausea and vomiting)   . QT prolongation   . Tobacco abuse    down to 2 cigarettes per day  . Vertigo     Past Surgical History:   Procedure Laterality Date  . ABDOMINAL HYSTERECTOMY    . CARDIAC CATHETERIZATION  03/2007   showed 60% lesion in the right coronary artery  . CHOLECYSTECTOMY    . LEFT HEART CATHETERIZATION WITH CORONARY ANGIOGRAM N/A 04/13/2012   Procedure: LEFT HEART CATHETERIZATION WITH CORONARY ANGIOGRAM;  Surgeon: Hillary Bow, MD;  Location: [facility] CATH LAB;  Service: Cardiovascular;  Laterality: N/A;  . LUMBAR LAMINECTOMY/DECOMPRESSION MICRODISCECTOMY Left 12/20/2018   Procedure: Left Lumbar Three-Four Lumbar Laminotomy and Foraminotomy, Lumbar Four-Five Laminotomy and Foraminotomy with Microdiscectomy and Resection of Synovial Cyst;  Surgeon: Jovita Gamma, MD;  Location: [facility];  Service: Neurosurgery;  Laterality: Left;  Left Lumbar 3-4 Lumbar laminotomy, foraminotomy, possible microdiscectomy with possible resection of synovial cyst  . SPINE SURGERY    . VARICOSE VEIN SURGERY    . VESICOVAGINAL FISTULA CLOSURE W/ TAH      FAMHx:  Family History  Problem Relation Age of Onset  . Cancer Father        Colon  . Coronary artery disease Other   . Heart attack Mother        d. 35  . Breast cancer Paternal Uncle 16  . Dementia Maternal Grandmother   . Leukemia Maternal Uncle 1    SOCHx:   reports that she quit smoking about 5 weeks ago. Her smoking use included cigarettes. She has a 2.50 pack-year smoking history. She has never used smokeless tobacco. She reports that she does not drink alcohol and does not use drugs.  ALLERGIES:  Allergies  Allergen Reactions  . Bee Venom Anaphylaxis  . Doxycycline Other (See Comments) and Nausea Only    Due to Pre-Existing conditions involved with stomach, patient does not take the following medication  . Erythromycin Other (See Comments) and Nausea And Vomiting    Due to Pre-Existing conditions involved with stomach, patient does not take the following medication  . Ibuprofen Nausea Only    gastroparesis   . Penicillins Anaphylaxis and Swelling    Has  patient had a PCN reaction causing immediate rash, facial/tongue/throat swelling, SOB or lightheadedness with hypotension: Yes Has patient had a PCN reaction causing severe rash involving mucus membranes or skin necrosis: Yes Has patient had a PCN reaction that required hospitalization No Has patient had a PCN reaction occurring within the last 10 years: No If all of the above answers are "NO", then may proceed with Cephalosporin use.   . Sulfa Antibiotics Hives and Itching  . Cat Hair Extract Hives, Itching and Swelling    SWELLING REACTION UNSPECIFIED   . Dust Mite Extract Hives and Swelling  . Tramadol Diarrhea, Nausea And Vomiting and Nausea Only  . Morphine And Related Itching    ROS: Pertinent items noted in HPI and remainder of comprehensive ROS otherwise negative.  HOME MEDS: Current Outpatient Medications  Medication Sig Dispense Refill  . albuterol (VENTOLIN HFA)  108 (90 Base) MCG/ACT inhaler Inhale 1-2 puffs into the lungs every 6 (six) hours as needed for wheezing or shortness of breath.     Marland Kitchen alendronate (FOSAMAX) 70 MG tablet Take 70 mg by mouth once a week.    . ALPRAZolam (XANAX) 0.5 MG tablet Take 0.5 mg by mouth 3 (three) times daily as needed.    Marland Kitchen amLODipine (NORVASC) 10 MG tablet Take 10 mg by mouth daily.     Marland Kitchen aspirin 81 MG chewable tablet Chew 81 mg by mouth daily.     Marland Kitchen atorvastatin (LIPITOR) 20 MG tablet Take 20 mg by mouth daily.    Marland Kitchen buPROPion (WELLBUTRIN SR) 150 MG 12 hr tablet Take 150 mg by mouth 2 (two) times daily.    . cholecalciferol (VITAMIN D) 1000 UNITS tablet Take 1,000 Units by mouth daily.    Marland Kitchen gabapentin (NEURONTIN) 600 MG tablet Take 600 mg by mouth 4 (four) times daily.    . hydrocortisone (CORTEF) 10 MG tablet Take 10-20 mg by mouth See admin instructions. Takes 20 mg in the morning and 10 mg at night    . levothyroxine (SYNTHROID, LEVOTHROID) 75 MCG tablet Take 75 mcg by mouth daily before breakfast.    . meclizine (ANTIVERT) 25 MG tablet 1  or 2 tabs PO q8h prn dizziness (Patient taking differently: Take 25-50 mg by mouth 3 (three) times daily as needed (for vertigo).) 15 tablet 0  . nitroGLYCERIN (NITROSTAT) 0.4 MG SL tablet Place 1 tablet (0.4 mg total) under the tongue every 5 (five) minutes as needed for chest pain. 25 tablet 2  . ondansetron (ZOFRAN ODT) 4 MG disintegrating tablet Take 1 tablet (4 mg total) by mouth every 8 (eight) hours as needed for nausea. 12 tablet 0  . oxyCODONE (ROXICODONE) 15 MG immediate release tablet Take 15 mg by mouth every 4 (four) hours as needed for pain.     Marland Kitchen tiZANidine (ZANAFLEX) 4 MG tablet Take 4 mg by mouth 4 (four) times daily as needed.    . zolpidem (AMBIEN) 10 MG tablet Take 10 mg by mouth at bedtime.    . furosemide (LASIX) 20 MG tablet Take 1 tablet (20 mg total) by mouth 2 (two) times daily. 60 tablet 11  . hydrALAZINE (APRESOLINE) 25 MG tablet Take 1 tablet (25 mg total) by mouth 3 (three) times daily. 270 tablet 1   No current facility-administered medications for this visit.    LABS/IMAGING: No results found for this or any previous visit (from the past 48 hour(s)). No results found.  WEIGHTS: Wt Readings from Last 3 Encounters:  11/13/20 178 lb (80.7 kg)  10/21/20 178 lb (80.7 kg)  09/17/20 192 lb (87.1 kg)    VITALS: BP (!) 142/84   Pulse 70   Ht 5' 5.5" (1.664 m)   Wt 178 lb (80.7 kg)   SpO2 99%   BMI 29.17 kg/m   EXAM: General appearance: alert, no distress and mildly obese Neck: no carotid bruit, no JVD and thyroid not enlarged, symmetric, no tenderness/mass/nodules Lungs: clear to auscultation bilaterally Heart: regular rate and rhythm, S1, S2 normal, no murmur, click, rub or gallop Abdomen: soft, non-tender; bowel sounds normal; no masses,  no organomegaly Extremities: edema Trace ankle Pulses: 2+ and symmetric Skin: Skin color, texture, turgor normal. No rashes or lesions Neurologic: Grossly normal Psych:  Pleasant  EKG: Deferred  ASSESSMENT: 1. Lower extremity edema 2. Uncontrolled hypertension 3. Atypical musculoskeletal chest pain-improved with muscle relaxants, possibly cervicalgia with prior  history of neck surgery 4. Palpitations - monitor pending (2022) 5. Fatigue, nonrestorative sleep and witnessed apnea  PLAN: 1.   [name] has persistent lower extremity edema and uncontrolled hypertension.  I suspect a lot of her lower extremity swelling is related to neuropathy.  She has been trying to wear compression stockings.  I will plan to increase her Lasix further to 20 mg in the morning and 20 in the afternoon.  We will check a metabolic profile in a couple weeks.  She does have a history of Addison's disease and needs to have an appointment with her endocrinologist.  She is on hydrocortisone.  She said that she tried to contact her but has not been able to get a response back from the office.  She is scheduled to have MRIs of the brain and spine tomorrow.  We will follow up on those.  She was also placed on a monitor recently for palpitations.  That was turned in just a few days ago and the results not available.  I will review that and get back to her with those findings.  Plan follow-up with me in a few months.  Pixie Casino, MD, [facility], [place] Director of the Advanced Lipid Disorders &  Cardiovascular Risk Reduction Clinic Diplomate of the American Board of Clinical Lipidology Attending Cardiologist  Direct Dial: [phone]  Fax: [phone]  Website:  [email or web address] Bralynn Velador 11/13/2020, 5:24 PM

## 2020-11-13 NOTE — Patient Instructions (Signed)
Medication Instructions:  INCREASE lasix to 20mg  in the morning and in the afternoon  *If you need a refill on your cardiac medications before your next appointment, please call your pharmacy*   Lab Work: BMET in 2 weeks   If you have labs (blood work) drawn today and your tests are completely normal, you will receive your results only by: Marland Kitchen MyChart Message (if you have MyChart) OR . A paper copy in the mail If you have any lab test that is abnormal or we need to change your treatment, we will call you to review the results.   Testing/Procedures: NONE   Follow-Up: At [facility], you and your health needs are our priority.  As part of our continuing mission to provide you with exceptional heart care, we have created designated Provider Care Teams.  These Care Teams include your primary Cardiologist (physician) and Advanced Practice Providers (APPs -  Physician Assistants and Nurse Practitioners) who all work together to provide you with the care you need, when you need it.  We recommend signing up for the patient portal called "MyChart".  Sign up information is provided on this After Visit Summary.  MyChart is used to connect with patients for Virtual Visits (Telemedicine).  Patients are able to view lab/test results, encounter notes, upcoming appointments, etc.  Non-urgent messages can be sent to your provider as well.   To learn more about what you can do with MyChart, go to [email or web address].    Your next appointment:   3 month(s)  The format for your next appointment:   In Person  Provider:   You may see Pixie Casino, MD or one of the following Advanced Practice Providers on your designated Care Team:    [name], PA-C  Fabian Sharp, PA-C or   Roby Lofts, [place]    Other Instructions

## 2020-11-14 ENCOUNTER — Ambulatory Visit (HOSPITAL_COMMUNITY)
Admission: RE | Admit: 2020-11-14 | Discharge: 2020-11-14 | Disposition: A | Discharge status: Home / Self Care | Payer: Medicare HMO | Source: Ambulatory Visit | Attending: Neurology | Admitting: Neurology

## 2020-11-14 DIAGNOSIS — M4802 Spinal stenosis, cervical region: Secondary | ICD-10-CM | POA: Diagnosis not present

## 2020-11-14 DIAGNOSIS — M5030 Other cervical disc degeneration, unspecified cervical region: Secondary | ICD-10-CM | POA: Insufficient documentation

## 2020-11-14 DIAGNOSIS — G35 Multiple sclerosis: Secondary | ICD-10-CM | POA: Diagnosis not present

## 2020-11-14 DIAGNOSIS — M4803 Spinal stenosis, cervicothoracic region: Secondary | ICD-10-CM | POA: Diagnosis not present

## 2020-11-14 DIAGNOSIS — R9082 White matter disease, unspecified: Secondary | ICD-10-CM | POA: Insufficient documentation

## 2020-11-14 DIAGNOSIS — M50221 Other cervical disc displacement at C4-C5 level: Secondary | ICD-10-CM | POA: Diagnosis not present

## 2020-11-14 DIAGNOSIS — R519 Headache, unspecified: Secondary | ICD-10-CM | POA: Diagnosis not present

## 2020-11-14 MED ORDER — GADOBUTROL 1 MMOL/ML IV SOLN
7.5000 mL | Freq: Once | INTRAVENOUS | Status: AC | PRN
  Administered 2020-11-14: 7.5 mL via INTRAVENOUS

## 2020-11-17 DIAGNOSIS — I700 Atherosclerosis of aorta: Secondary | ICD-10-CM | POA: Diagnosis not present

## 2020-11-17 DIAGNOSIS — Z0001 Encounter for general adult medical examination with abnormal findings: Secondary | ICD-10-CM | POA: Diagnosis not present

## 2020-11-17 DIAGNOSIS — Z6828 Body mass index (BMI) 28.0-28.9, adult: Secondary | ICD-10-CM | POA: Diagnosis not present

## 2020-11-17 DIAGNOSIS — R69 Illness, unspecified: Secondary | ICD-10-CM | POA: Diagnosis not present

## 2020-11-17 DIAGNOSIS — S99822S Other specified injuries of left foot, sequela: Secondary | ICD-10-CM | POA: Diagnosis not present

## 2020-11-17 DIAGNOSIS — Z1331 Encounter for screening for depression: Secondary | ICD-10-CM | POA: Diagnosis not present

## 2020-11-17 DIAGNOSIS — E271 Primary adrenocortical insufficiency: Secondary | ICD-10-CM | POA: Diagnosis not present

## 2020-11-17 DIAGNOSIS — L03039 Cellulitis of unspecified toe: Secondary | ICD-10-CM | POA: Diagnosis not present

## 2020-11-17 DIAGNOSIS — F1120 Opioid dependence, uncomplicated: Secondary | ICD-10-CM | POA: Diagnosis not present

## 2020-11-21 DIAGNOSIS — R002 Palpitations: Secondary | ICD-10-CM | POA: Diagnosis not present

## 2020-11-21 DIAGNOSIS — R600 Localized edema: Secondary | ICD-10-CM | POA: Diagnosis not present

## 2020-11-24 ENCOUNTER — Other Ambulatory Visit: Payer: Self-pay | Admitting: Neurology

## 2020-11-24 DIAGNOSIS — M5416 Radiculopathy, lumbar region: Secondary | ICD-10-CM

## 2020-12-02 ENCOUNTER — Telehealth: Payer: Self-pay | Admitting: Internal Medicine

## 2020-12-02 ENCOUNTER — Ambulatory Visit: Payer: Medicare HMO | Admitting: Internal Medicine

## 2020-12-02 ENCOUNTER — Ambulatory Visit
Admission: RE | Admit: 2020-12-02 | Discharge: 2020-12-02 | Disposition: A | Discharge status: Home / Self Care | Payer: Medicare HMO | Source: Ambulatory Visit | Attending: Neurology | Admitting: Neurology

## 2020-12-02 DIAGNOSIS — M47817 Spondylosis without myelopathy or radiculopathy, lumbosacral region: Secondary | ICD-10-CM | POA: Diagnosis not present

## 2020-12-02 DIAGNOSIS — M5416 Radiculopathy, lumbar region: Secondary | ICD-10-CM

## 2020-12-02 MED ORDER — IOPAMIDOL (ISOVUE-M 200) INJECTION 41%
1.0000 mL | Freq: Once | INTRAMUSCULAR | Status: AC
  Administered 2020-12-02: 1 mL via EPIDURAL

## 2020-12-02 MED ORDER — METHYLPREDNISOLONE ACETATE 40 MG/ML INJ SUSP (RADIOLOG
120.0000 mg | Freq: Once | INTRAMUSCULAR | Status: AC
  Administered 2020-12-02: 120 mg via EPIDURAL

## 2020-12-02 NOTE — Telephone Encounter (Signed)
-----   Message from Jennefer Bravo sent at 12/01/2020  5:24 PM EDT ----- Regarding: FW: ZIO  ----- Message ----- From: Shon Millet Sent: 12/01/2020   4:51 PM EDT To: Jennefer Bravo Subject: ZIO                                            Patient calling for results. [phone]

## 2020-12-02 NOTE — Discharge Instructions (Signed)

## 2020-12-03 NOTE — Telephone Encounter (Signed)
Left message to call back regarding results Sent to [facility], Nadean Corwin, MD  Fidel Levy, RN Minimal SVT and isolated PVC's on monitor - low risk, could cause palps, but infrequent. No worries.   Dr Lemmie Evens

## 2020-12-04 ENCOUNTER — Other Ambulatory Visit: Payer: Medicare HMO

## 2020-12-05 DIAGNOSIS — M5136 Other intervertebral disc degeneration, lumbar region: Secondary | ICD-10-CM | POA: Diagnosis not present

## 2020-12-05 DIAGNOSIS — M4316 Spondylolisthesis, lumbar region: Secondary | ICD-10-CM | POA: Diagnosis not present

## 2020-12-05 DIAGNOSIS — G4709 Other insomnia: Secondary | ICD-10-CM | POA: Diagnosis not present

## 2020-12-05 DIAGNOSIS — M1991 Primary osteoarthritis, unspecified site: Secondary | ICD-10-CM | POA: Diagnosis not present

## 2020-12-05 DIAGNOSIS — G894 Chronic pain syndrome: Secondary | ICD-10-CM | POA: Diagnosis not present

## 2020-12-05 DIAGNOSIS — S99822S Other specified injuries of left foot, sequela: Secondary | ICD-10-CM | POA: Diagnosis not present

## 2020-12-09 ENCOUNTER — Other Ambulatory Visit: Payer: Self-pay | Admitting: Internal Medicine

## 2020-12-16 ENCOUNTER — Other Ambulatory Visit: Payer: Self-pay

## 2020-12-16 ENCOUNTER — Other Ambulatory Visit: Payer: Medicare HMO

## 2020-12-16 DIAGNOSIS — Z79899 Other long term (current) drug therapy: Secondary | ICD-10-CM | POA: Diagnosis not present

## 2020-12-16 DIAGNOSIS — M4316 Spondylolisthesis, lumbar region: Secondary | ICD-10-CM | POA: Diagnosis not present

## 2020-12-16 DIAGNOSIS — M5414 Radiculopathy, thoracic region: Secondary | ICD-10-CM | POA: Diagnosis not present

## 2020-12-16 LAB — BASIC METABOLIC PANEL
BUN/Creatinine Ratio: 13 (ref 9–23)
BUN: 22 mg/dL (ref 6–24)
CO2: 25 mmol/L (ref 20–29)
Calcium: 9.6 mg/dL (ref 8.7–10.2)
Chloride: 98 mmol/L (ref 96–106)
Creatinine, Ser: 1.71 mg/dL — ABNORMAL HIGH (ref 0.57–1.00)
Glucose: 85 mg/dL (ref 65–99)
Potassium: 5.5 mmol/L — ABNORMAL HIGH (ref 3.5–5.2)
Sodium: 140 mmol/L (ref 134–144)
eGFR: 35 mL/min/{1.73_m2} — ABNORMAL LOW (ref >59)

## 2020-12-17 ENCOUNTER — Other Ambulatory Visit: Payer: Self-pay | Admitting: *Deleted

## 2020-12-17 DIAGNOSIS — R600 Localized edema: Secondary | ICD-10-CM

## 2020-12-17 DIAGNOSIS — I10 Essential (primary) hypertension: Secondary | ICD-10-CM

## 2020-12-17 DIAGNOSIS — Z79899 Other long term (current) drug therapy: Secondary | ICD-10-CM

## 2020-12-25 ENCOUNTER — Other Ambulatory Visit: Payer: Medicare HMO

## 2020-12-25 ENCOUNTER — Telehealth: Payer: Self-pay | Admitting: Internal Medicine

## 2020-12-25 NOTE — Telephone Encounter (Signed)
STAT if patient feels like he/she is going to faint   1) Are you dizzy now? Yes    2) Do you feel faint or have you passed out? No   3) Do you have any other symptoms? No   4) Have you checked your HR and BP (record if available)?  04/21: 152/99  No additional readings available, but patient assumes the dizziness may be due to her BP or vertigo.

## 2020-12-25 NOTE — Telephone Encounter (Signed)
Spoke with patient regarding dizziness. She was only calling to r/s her lab appointment due to feeling dizzy today but has no acute concerns. She has PRN meclizine. She needs no further assistance.

## 2020-12-26 ENCOUNTER — Other Ambulatory Visit: Payer: Medicare HMO

## 2020-12-31 ENCOUNTER — Other Ambulatory Visit: Payer: Medicare HMO

## 2020-12-31 DIAGNOSIS — M545 Low back pain, unspecified: Secondary | ICD-10-CM | POA: Diagnosis not present

## 2020-12-31 DIAGNOSIS — M5416 Radiculopathy, lumbar region: Secondary | ICD-10-CM | POA: Diagnosis not present

## 2020-12-31 DIAGNOSIS — R252 Cramp and spasm: Secondary | ICD-10-CM | POA: Diagnosis not present

## 2020-12-31 DIAGNOSIS — R251 Tremor, unspecified: Secondary | ICD-10-CM | POA: Diagnosis not present

## 2020-12-31 DIAGNOSIS — M542 Cervicalgia: Secondary | ICD-10-CM | POA: Diagnosis not present

## 2021-01-01 ENCOUNTER — Other Ambulatory Visit: Payer: Medicare HMO

## 2021-01-01 ENCOUNTER — Encounter: Payer: Self-pay | Admitting: Internal Medicine

## 2021-01-01 ENCOUNTER — Telehealth: Payer: Self-pay | Admitting: Internal Medicine

## 2021-01-01 ENCOUNTER — Other Ambulatory Visit: Payer: Self-pay

## 2021-01-01 DIAGNOSIS — E875 Hyperkalemia: Secondary | ICD-10-CM

## 2021-01-01 DIAGNOSIS — I10 Essential (primary) hypertension: Secondary | ICD-10-CM

## 2021-01-01 DIAGNOSIS — Z79899 Other long term (current) drug therapy: Secondary | ICD-10-CM

## 2021-01-01 DIAGNOSIS — R600 Localized edema: Secondary | ICD-10-CM | POA: Diagnosis not present

## 2021-01-01 NOTE — Telephone Encounter (Signed)
Called the patient's home and cellphone numbers - left a voicemail message on her cellphone after her message positively identified herself. Labs today show high potassium of 6.2. Was 5.5 about 2 weeks ago - lasix has been stopped. No potassium supplements on her list. Advised her to take Lokelma 10g x 1 asap.  [facility] [place] at 6:20 pm on 01/01/2021. I advised the patient to pick up the Rx tonight if possible or early tomorrow morning -the pharmacy is open until 9 pm.  Will need a repeat BMET early next week (Monday/Tuesday).  Pixie Casino, MD, [facility], [place] Director of the Advanced Lipid Disorders &  Cardiovascular Risk Reduction Clinic Diplomate of the American Board of Clinical Lipidology Attending Cardiologist  Direct Dial: [phone]  Fax: [phone]  Website:  [email or web address]

## 2021-01-01 NOTE — Telephone Encounter (Signed)
Erroneous encounter

## 2021-01-02 ENCOUNTER — Telehealth: Payer: Self-pay | Admitting: Internal Medicine

## 2021-01-02 DIAGNOSIS — G894 Chronic pain syndrome: Secondary | ICD-10-CM | POA: Diagnosis not present

## 2021-01-02 DIAGNOSIS — J309 Allergic rhinitis, unspecified: Secondary | ICD-10-CM | POA: Diagnosis not present

## 2021-01-02 LAB — BASIC METABOLIC PANEL
BUN/Creatinine Ratio: 17 (ref 9–23)
BUN: 25 mg/dL — ABNORMAL HIGH (ref 6–24)
CO2: 22 mmol/L (ref 20–29)
Calcium: 9.8 mg/dL (ref 8.7–10.2)
Chloride: 100 mmol/L (ref 96–106)
Creatinine, Ser: 1.48 mg/dL — ABNORMAL HIGH (ref 0.57–1.00)
Glucose: 126 mg/dL — ABNORMAL HIGH (ref 65–99)
Potassium: 6.2 mmol/L (ref 3.5–5.2)
Sodium: 138 mmol/L (ref 134–144)
eGFR: 41 mL/min/{1.73_m2} — ABNORMAL LOW (ref >59)

## 2021-01-02 IMAGING — DX DG LUMBAR SPINE 2-3V
3 series · 3 of 3 positions shown · non-contrast
Comparison: December 01, 2018.

CLINICAL DATA: Low back pain

EXAM:
LUMBAR SPINE - 2-3 VIEW

[l-spine ap]
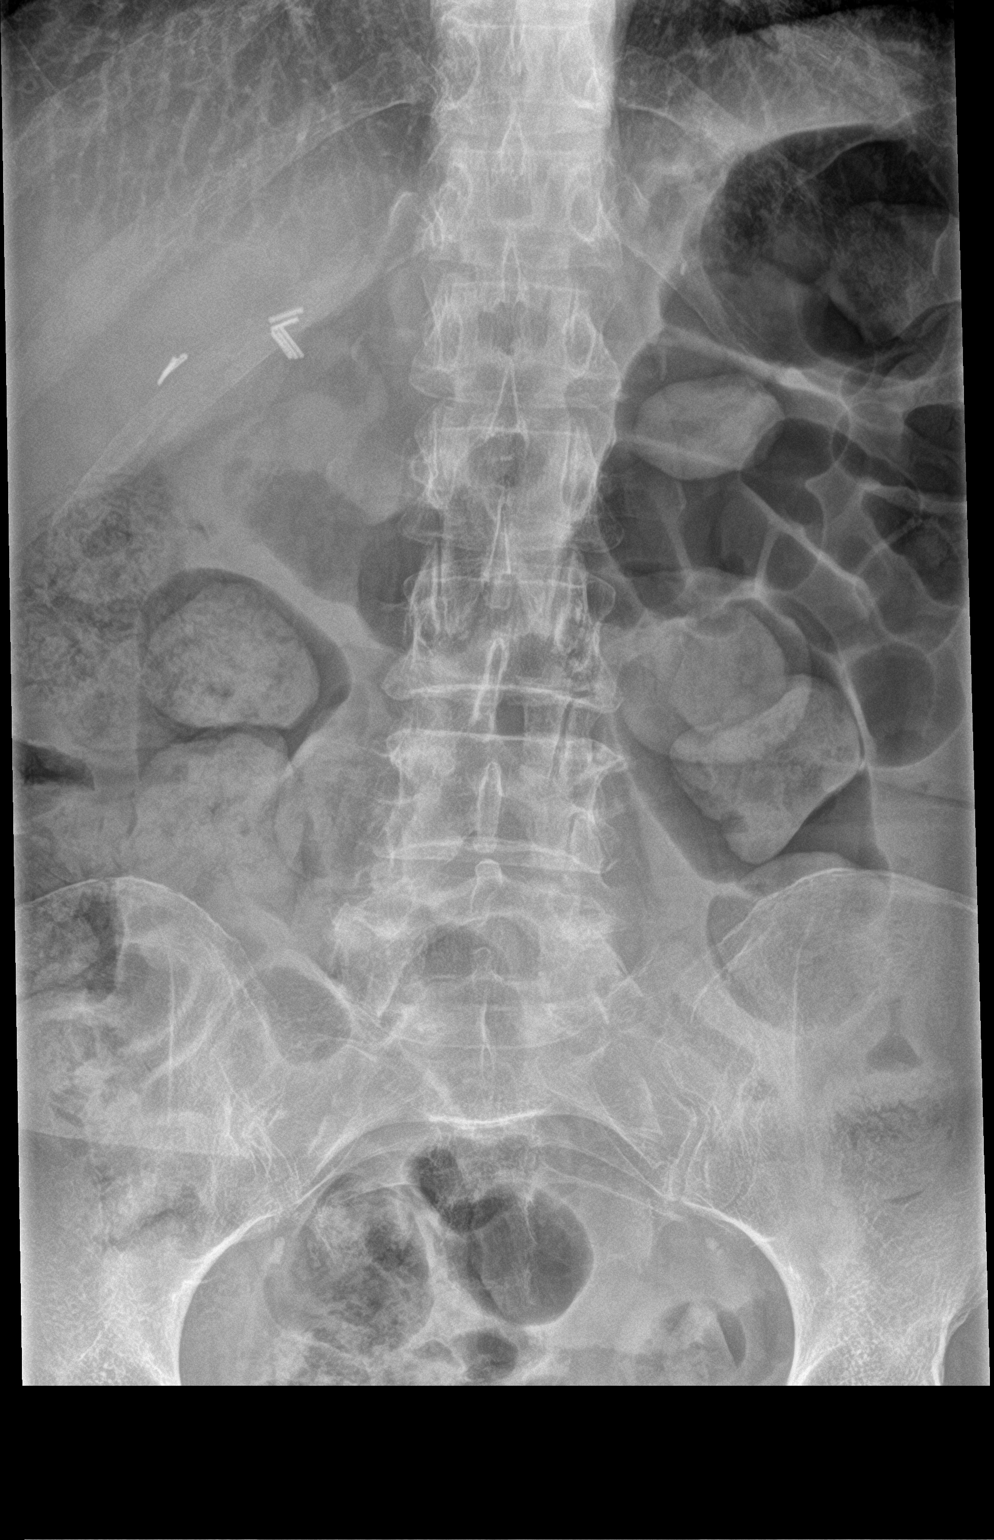

[l-spine lat]
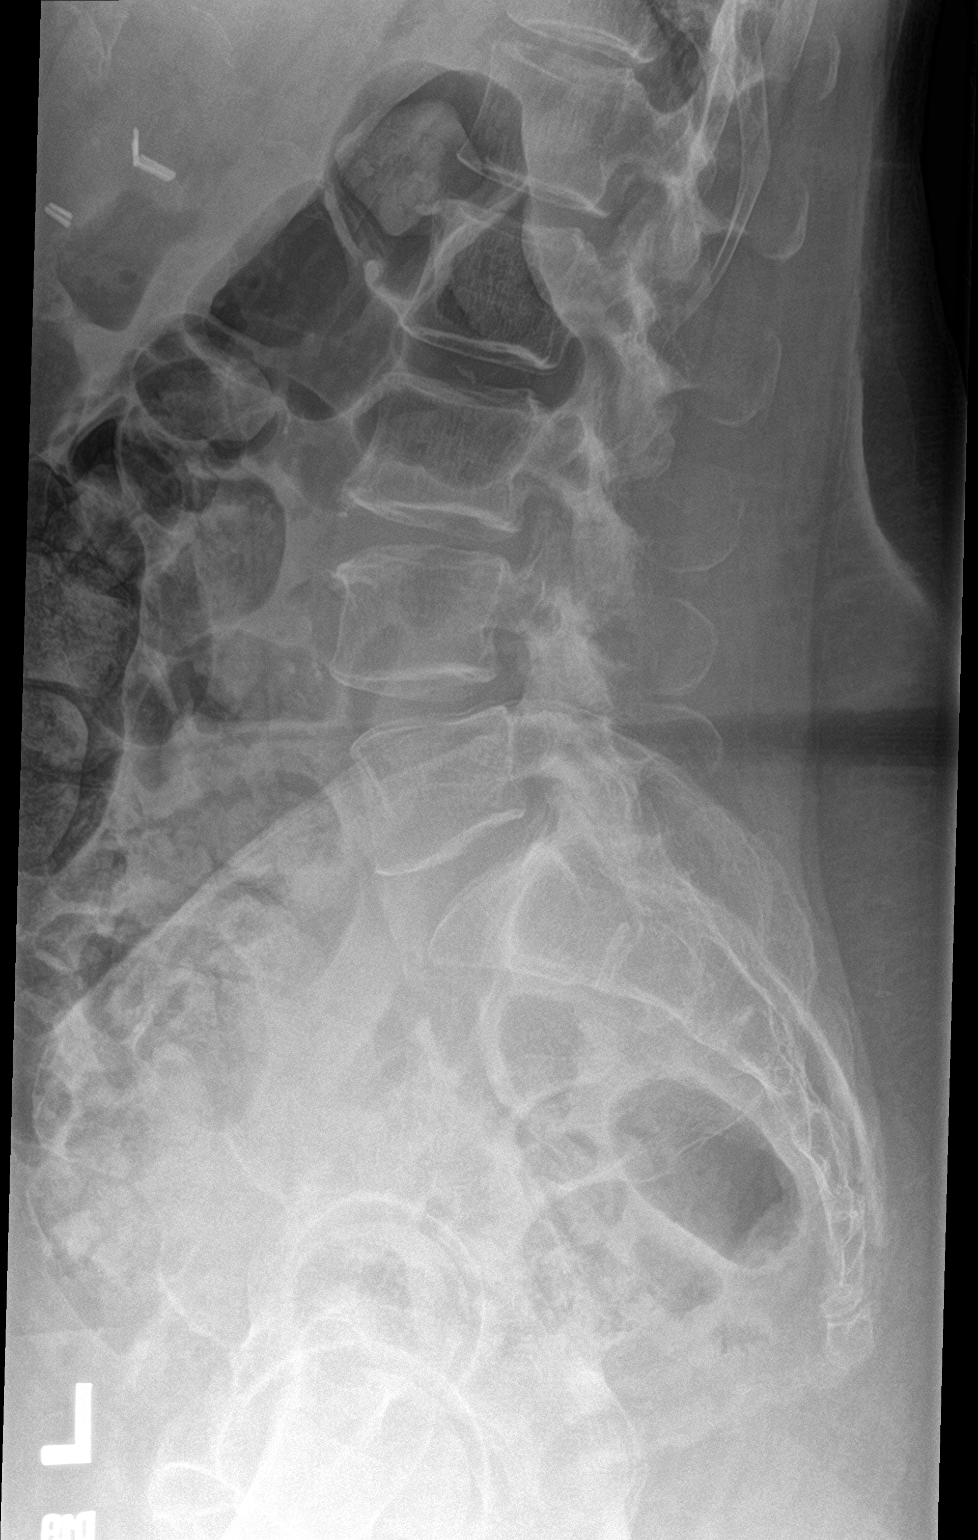

[l-spine spot]
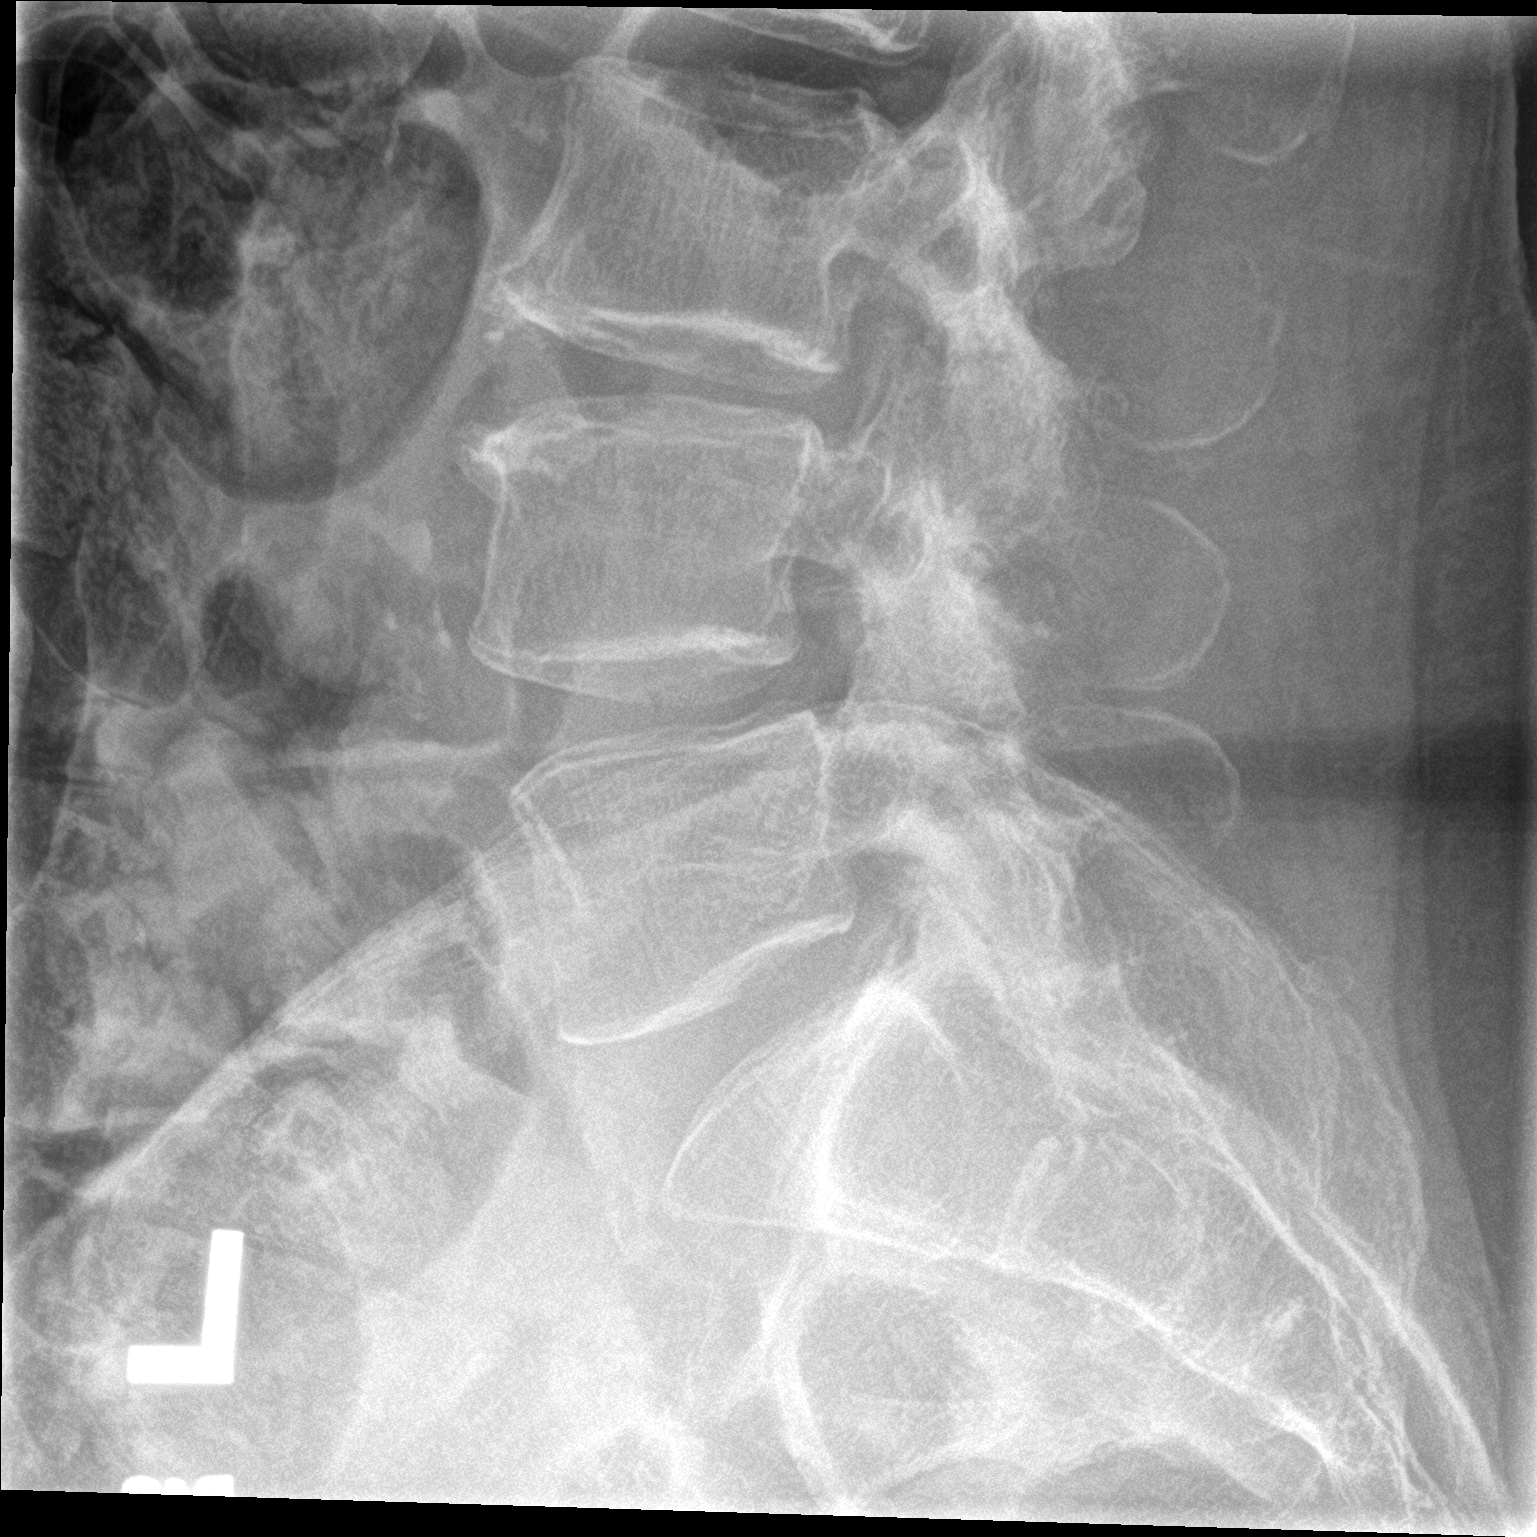

[3 of 3 positions shown; findings below may reference images not displayed]

FINDINGS: Frontal, lateral, and spot lumbosacral lateral images were obtained.
There are 5 non-rib-bearing lumbar type vertebral bodies. There is
no fracture or spondylolisthesis. Disc spaces appear normal. There
is facet osteoarthritic change at L4-5 and L5-S1 bilaterally.
IMPRESSION: No fracture or spondylolisthesis. No appreciable disc space
narrowing. Facet osteoarthritic change at L4-5 and L5-S1 bilaterally
noted.

## 2021-01-02 MED ORDER — LOKELMA 10 G PO PACK: 10.0000 g | PACK | Freq: Once | ORAL | 0 refills | Status: AC

## 2021-01-02 NOTE — Telephone Encounter (Signed)
Santiago Glad from [employer] with critical lab results.

## 2021-01-02 NOTE — Addendum Note (Signed)
Addended by: Fidel Levy on: 01/02/2021 09:09 AM   Modules accepted: Orders

## 2021-01-02 NOTE — Telephone Encounter (Signed)
Spoke with patient to confirm receipt of VM from MD. Patient has not checked her messages yet. Relayed to her the message from Dr. Debara Pickett, as noted below. She was advised to pick up prescription ASAP and to have lab work next Monday or Tueday (BMET ordered). She verbalized understanding of instructions

## 2021-01-02 NOTE — Telephone Encounter (Signed)
Received call from Santiago Glad with [employer] calling to report potassium 6.2.This has already been reported to Dr.Hilty.Pt was prescribed lokelma.She will repeat potassium next week.

## 2021-01-02 NOTE — Telephone Encounter (Signed)
Dr. Debara Pickett addressed critical lab (potassium) last night and patient is aware

## 2021-01-02 NOTE — Telephone Encounter (Signed)
[employer] called to report a critical lab report on patient

## 2021-01-06 ENCOUNTER — Other Ambulatory Visit: Payer: Self-pay

## 2021-01-06 ENCOUNTER — Other Ambulatory Visit: Payer: Medicare HMO | Admitting: *Deleted

## 2021-01-06 DIAGNOSIS — E875 Hyperkalemia: Secondary | ICD-10-CM | POA: Diagnosis not present

## 2021-01-06 DIAGNOSIS — M5414 Radiculopathy, thoracic region: Secondary | ICD-10-CM | POA: Diagnosis not present

## 2021-01-06 DIAGNOSIS — M546 Pain in thoracic spine: Secondary | ICD-10-CM | POA: Diagnosis not present

## 2021-01-07 LAB — BASIC METABOLIC PANEL
BUN/Creatinine Ratio: 18 (ref 9–23)
BUN: 27 mg/dL — ABNORMAL HIGH (ref 6–24)
CO2: 17 mmol/L — ABNORMAL LOW (ref 20–29)
Calcium: 9.5 mg/dL (ref 8.7–10.2)
Chloride: 103 mmol/L (ref 96–106)
Creatinine, Ser: 1.52 mg/dL — ABNORMAL HIGH (ref 0.57–1.00)
Glucose: 94 mg/dL (ref 65–99)
Potassium: 5.1 mmol/L (ref 3.5–5.2)
Sodium: 138 mmol/L (ref 134–144)
eGFR: 40 mL/min/{1.73_m2} — ABNORMAL LOW (ref >59)

## 2021-01-08 ENCOUNTER — Telehealth: Payer: Self-pay | Admitting: Internal Medicine

## 2021-01-08 DIAGNOSIS — I10 Essential (primary) hypertension: Secondary | ICD-10-CM | POA: Diagnosis not present

## 2021-01-08 DIAGNOSIS — Z6829 Body mass index (BMI) 29.0-29.9, adult: Secondary | ICD-10-CM | POA: Diagnosis not present

## 2021-01-08 DIAGNOSIS — M4316 Spondylolisthesis, lumbar region: Secondary | ICD-10-CM | POA: Diagnosis not present

## 2021-01-08 NOTE — Telephone Encounter (Signed)
Spoke with patient - notified of results. Relayed that her prior message was sent to MD and will follow up with his reply

## 2021-01-08 NOTE — Telephone Encounter (Signed)
Fidel Levy, RN  01/08/2021 3:18 PM EDT Back to Top     Results sent to [place] - patient viewed 01/07/2021 @ 10:15am   Pixie Casino, MD  01/07/2021 9:02 PM EDT      Creatinine is stable - potassium has normalized  Dr Lemmie Evens   Previously patient had asked about a renal ultrasound - will send to MD to advise if this should be ordered?

## 2021-01-08 NOTE — Telephone Encounter (Signed)
    Pt is calling to get result for her labs done 2 days ago. She also mentioned about the ultrasound she got, she said, she feels like the pain she was having is from her kidney

## 2021-01-09 ENCOUNTER — Other Ambulatory Visit: Payer: Self-pay | Admitting: Neurological Surgery

## 2021-01-09 ENCOUNTER — Telehealth: Payer: Self-pay | Admitting: *Deleted

## 2021-01-09 DIAGNOSIS — M4310 Spondylolisthesis, site unspecified: Secondary | ICD-10-CM

## 2021-01-09 DIAGNOSIS — I10 Essential (primary) hypertension: Secondary | ICD-10-CM

## 2021-01-09 NOTE — Telephone Encounter (Signed)
Spoke with patient and relayed that MD has not ordered a renal doppler. Explained that previously he mentioned ordered renal U/S if renal function did not return to baseline, which is has based on lab trends. Her creatine has been historically elevated, and I suggested she may need to see a nephrologist. She also reports flank/lower back pain she attributes to her kidneys. Explained that this type of pain would not be assessed on the vascular test that Dr. Debara Pickett could potentially order. Advised she notify her PCP of her concerns - recent labs routed to PCP

## 2021-01-09 NOTE — Telephone Encounter (Signed)
Defer to PCP about renal ultrasound    Dr Lemmie Evens

## 2021-01-09 NOTE — Telephone Encounter (Signed)
   [facility] Pre-operative Risk Assessment    Patient Name: DANICE DIPPOLITO  DOB: 1966-05-19  MRN: [id number]   HEARTCARE STAFF: - Please ensure there is not already an duplicate clearance open for this procedure. - Under Visit Info/Reason for Call, type in Other and utilize the format Clearance MM/DD/YY or Clearance TBD. Do not use dashes or single digits. - If request is for dental extraction, please clarify the # of teeth to be extracted.  Request for surgical clearance:  1. What type of surgery is being performed? L4-5 LUMBAR FUSION   2. When is this surgery scheduled? 01/23/21   3. What type of clearance is required (medical clearance vs. Pharmacy clearance to hold med vs. Both)? MEDICAL  4. Are there any medications that need to be held prior to surgery and how long? ASA    5. Practice name and name of physician performing surgery? [facility]; DR. Sherley Bounds   6. What is the office phone number? [phone]   7.   What is the office fax number? [phone] ATTN: VANESSA x [phone]  8.   Anesthesia type (None, local, MAC, general) ? GENERAL   Julaine Hua 01/09/2021, 12:46 PM  _________________________________________________________________   (provider comments below)

## 2021-01-09 NOTE — Telephone Encounter (Signed)
   Name: [name]  DOB: 1965-10-14  MRN: [id number]   Primary Cardiologist: Pixie Casino, MD  Chart reviewed as part of pre-operative protocol coverage.   Left a voicemail for patient to call back for ongoing preop assessment.   Abigail Butts, PA-C 01/09/2021, 4:10 PM

## 2021-01-12 ENCOUNTER — Ambulatory Visit (HOSPITAL_COMMUNITY)
Admission: RE | Admit: 2021-01-12 | Discharge: 2021-01-12 | Disposition: A | Discharge status: Home / Self Care | Payer: Medicare HMO | Source: Ambulatory Visit | Attending: Physician Assistant | Admitting: Physician Assistant

## 2021-01-12 ENCOUNTER — Other Ambulatory Visit: Payer: Self-pay

## 2021-01-12 DIAGNOSIS — Z1231 Encounter for screening mammogram for malignant neoplasm of breast: Secondary | ICD-10-CM | POA: Diagnosis not present

## 2021-01-14 MED ORDER — AMLODIPINE BESYLATE 5 MG PO TABS: 5.0000 mg | ORAL_TABLET | Freq: Every day | ORAL | 3 refills | Status: DC

## 2021-01-14 NOTE — Telephone Encounter (Signed)
   Name: [name]  DOB: 1966/01/09  MRN: [id number]   Primary Cardiologist: Pixie Casino, MD  Chart reviewed as part of pre-operative protocol coverage. Patient was contacted 01/14/2021 in reference to pre-operative risk assessment for pending surgery as outlined below.  [name] was last seen on 11/13/20 by Dr. Debara Pickett.  Since that day, [name] has done okay from a cardiac standpoint. She has been quite limited in activity due to back pain. Up until a few weeks ago she was able to walk a couple blocks, though has not been able to go up stairs or do household chores like vacuuming/moping/changing sheets for quite some time.   She has no known CAD - LHC in 2013 was without significant disease and NST 01/2020 was without ischemia. Echo 10/2020 showed EF 55-60%, G1DD, no RWMA, and no significant valvular abnormalities.  Dr. Debara Pickett - can you weigh in on her preop risk given inability to complete 4 METs. Favor proceeding without further work-up given reassuring echo/stress test in the past year. Please route your response back to P CV DIV PREOP.   Of note, patient also reports intermittent dizziness. She states her blood pressure has been running low recently with SBP in the 80s-90s at times. She reports she had already stopped her hydralazine several weeks ago though BP remains low. Recommended reducing amlodipine to 5mg  daily and keep a log of blood pressures to bring to her follow-up visit 02/18/21. She will notify the office of BP is persistently elevated above 130/80.   Callback: - Can you please update her medication list to reflect that she stopped hydralazine and I have recommended reducing amlodipine to 5mg  daily. Thanks! Abigail Butts, PA-C 01/14/2021, 9:18 AM

## 2021-01-14 NOTE — Telephone Encounter (Signed)
Per instructions from pre op provider today Daleen Snook K. PAC) update med list. See notes. Med list has been updated to reflect pt has d/c Hydralazine as well as pt has been advised to decrease Amlodipine to 5 mg daily, new Rx ha sent in for Amlodipine 5 mg daily.

## 2021-01-16 NOTE — Telephone Encounter (Signed)
Spoke with [employer]. Note that surgery has been rescheduled for 02/04/21.  Loel Dubonnet, NP

## 2021-01-21 NOTE — Telephone Encounter (Signed)
Notes faxed to surgeon. This phone note will be removed from the preop pool. Richardson Dopp, PA-C  01/21/2021 11:24 AM

## 2021-01-21 NOTE — Telephone Encounter (Signed)
   Primary Cardiologist: Pixie Casino, MD  Chart reviewed as part of pre-operative protocol coverage.   Please see previous notes from Roby Lofts, [place] and Dr. Debara Pickett.  Recommendations: . Based on ACC/AHA guidelines, the patient is at acceptable risk for the planned procedure without further cardiovascular testing.  . ASA can be held for 7 days prior to surgery and resumed post op when felt to be safe.    Please call with questions. Richardson Dopp, PA-C 01/21/2021, 11:19 AM

## 2021-01-21 NOTE — Telephone Encounter (Signed)
Ok to proceed with surgery without further testing  Dr Lemmie Evens

## 2021-01-22 DIAGNOSIS — J111 Influenza due to unidentified influenza virus with other respiratory manifestations: Secondary | ICD-10-CM | POA: Diagnosis not present

## 2021-01-22 DIAGNOSIS — N3001 Acute cystitis with hematuria: Secondary | ICD-10-CM | POA: Diagnosis not present

## 2021-01-22 DIAGNOSIS — R6889 Other general symptoms and signs: Secondary | ICD-10-CM | POA: Diagnosis not present

## 2021-01-25 ENCOUNTER — Inpatient Hospital Stay (HOSPITAL_COMMUNITY)
Admission: EM | Admit: 2021-01-25 | Discharge: 2021-01-31 | DRG: 064 | Disposition: A | Discharge status: Home Health | Payer: Medicare HMO | Attending: Family Medicine | Admitting: Family Medicine

## 2021-01-25 ENCOUNTER — Emergency Department (HOSPITAL_COMMUNITY): Payer: Medicare HMO

## 2021-01-25 ENCOUNTER — Other Ambulatory Visit: Payer: Self-pay

## 2021-01-25 ENCOUNTER — Encounter (HOSPITAL_COMMUNITY): Payer: Self-pay | Admitting: Emergency Medicine

## 2021-01-25 DIAGNOSIS — Z885 Allergy status to narcotic agent status: Secondary | ICD-10-CM

## 2021-01-25 DIAGNOSIS — M5136 Other intervertebral disc degeneration, lumbar region: Secondary | ICD-10-CM

## 2021-01-25 DIAGNOSIS — Z743 Need for continuous supervision: Secondary | ICD-10-CM | POA: Diagnosis not present

## 2021-01-25 DIAGNOSIS — Z87891 Personal history of nicotine dependence: Secondary | ICD-10-CM

## 2021-01-25 DIAGNOSIS — R1310 Dysphagia, unspecified: Secondary | ICD-10-CM | POA: Diagnosis present

## 2021-01-25 DIAGNOSIS — I2510 Atherosclerotic heart disease of native coronary artery without angina pectoris: Secondary | ICD-10-CM | POA: Diagnosis not present

## 2021-01-25 DIAGNOSIS — I700 Atherosclerosis of aorta: Secondary | ICD-10-CM | POA: Diagnosis not present

## 2021-01-25 DIAGNOSIS — R4701 Aphasia: Secondary | ICD-10-CM | POA: Diagnosis not present

## 2021-01-25 DIAGNOSIS — Z7982 Long term (current) use of aspirin: Secondary | ICD-10-CM

## 2021-01-25 DIAGNOSIS — Z79891 Long term (current) use of opiate analgesic: Secondary | ICD-10-CM

## 2021-01-25 DIAGNOSIS — Z8674 Personal history of sudden cardiac arrest: Secondary | ICD-10-CM

## 2021-01-25 DIAGNOSIS — Z79899 Other long term (current) drug therapy: Secondary | ICD-10-CM

## 2021-01-25 DIAGNOSIS — R9431 Abnormal electrocardiogram [ECG] [EKG]: Secondary | ICD-10-CM | POA: Diagnosis present

## 2021-01-25 DIAGNOSIS — M51369 Other intervertebral disc degeneration, lumbar region without mention of lumbar back pain or lower extremity pain: Secondary | ICD-10-CM | POA: Diagnosis present

## 2021-01-25 DIAGNOSIS — Z79890 Hormone replacement therapy (postmenopausal): Secondary | ICD-10-CM

## 2021-01-25 DIAGNOSIS — G8929 Other chronic pain: Secondary | ICD-10-CM | POA: Diagnosis present

## 2021-01-25 DIAGNOSIS — I10 Essential (primary) hypertension: Secondary | ICD-10-CM | POA: Diagnosis present

## 2021-01-25 DIAGNOSIS — Z8249 Family history of ischemic heart disease and other diseases of the circulatory system: Secondary | ICD-10-CM

## 2021-01-25 DIAGNOSIS — R29818 Other symptoms and signs involving the nervous system: Secondary | ICD-10-CM | POA: Diagnosis not present

## 2021-01-25 DIAGNOSIS — Z803 Family history of malignant neoplasm of breast: Secondary | ICD-10-CM

## 2021-01-25 DIAGNOSIS — I429 Cardiomyopathy, unspecified: Secondary | ICD-10-CM | POA: Diagnosis present

## 2021-01-25 DIAGNOSIS — Z886 Allergy status to analgesic agent status: Secondary | ICD-10-CM

## 2021-01-25 DIAGNOSIS — Z7952 Long term (current) use of systemic steroids: Secondary | ICD-10-CM

## 2021-01-25 DIAGNOSIS — Z20822 Contact with and (suspected) exposure to covid-19: Secondary | ICD-10-CM | POA: Diagnosis present

## 2021-01-25 DIAGNOSIS — R58 Hemorrhage, not elsewhere classified: Secondary | ICD-10-CM | POA: Diagnosis not present

## 2021-01-25 DIAGNOSIS — W19XXXA Unspecified fall, initial encounter: Secondary | ICD-10-CM | POA: Diagnosis not present

## 2021-01-25 DIAGNOSIS — Z880 Allergy status to penicillin: Secondary | ICD-10-CM

## 2021-01-25 DIAGNOSIS — K219 Gastro-esophageal reflux disease without esophagitis: Secondary | ICD-10-CM | POA: Diagnosis not present

## 2021-01-25 DIAGNOSIS — Z881 Allergy status to other antibiotic agents status: Secondary | ICD-10-CM

## 2021-01-25 DIAGNOSIS — Z90710 Acquired absence of both cervix and uterus: Secondary | ICD-10-CM | POA: Diagnosis not present

## 2021-01-25 DIAGNOSIS — E271 Primary adrenocortical insufficiency: Secondary | ICD-10-CM | POA: Diagnosis not present

## 2021-01-25 DIAGNOSIS — R296 Repeated falls: Secondary | ICD-10-CM

## 2021-01-25 DIAGNOSIS — G9341 Metabolic encephalopathy: Secondary | ICD-10-CM | POA: Diagnosis present

## 2021-01-25 DIAGNOSIS — Z9049 Acquired absence of other specified parts of digestive tract: Secondary | ICD-10-CM | POA: Diagnosis not present

## 2021-01-25 DIAGNOSIS — M542 Cervicalgia: Secondary | ICD-10-CM | POA: Diagnosis not present

## 2021-01-25 DIAGNOSIS — Z806 Family history of leukemia: Secondary | ICD-10-CM

## 2021-01-25 DIAGNOSIS — E039 Hypothyroidism, unspecified: Secondary | ICD-10-CM | POA: Diagnosis present

## 2021-01-25 DIAGNOSIS — I639 Cerebral infarction, unspecified: Secondary | ICD-10-CM | POA: Diagnosis not present

## 2021-01-25 DIAGNOSIS — I6381 Other cerebral infarction due to occlusion or stenosis of small artery: Secondary | ICD-10-CM | POA: Diagnosis not present

## 2021-01-25 DIAGNOSIS — R609 Edema, unspecified: Secondary | ICD-10-CM | POA: Diagnosis not present

## 2021-01-25 DIAGNOSIS — Z882 Allergy status to sulfonamides status: Secondary | ICD-10-CM

## 2021-01-25 DIAGNOSIS — Z800 Family history of malignant neoplasm of digestive organs: Secondary | ICD-10-CM

## 2021-01-25 DIAGNOSIS — R339 Retention of urine, unspecified: Secondary | ICD-10-CM | POA: Diagnosis present

## 2021-01-25 DIAGNOSIS — R2990 Unspecified symptoms and signs involving the nervous system: Secondary | ICD-10-CM

## 2021-01-25 DIAGNOSIS — M4310 Spondylolisthesis, site unspecified: Secondary | ICD-10-CM

## 2021-01-25 DIAGNOSIS — F419 Anxiety disorder, unspecified: Secondary | ICD-10-CM | POA: Diagnosis present

## 2021-01-25 DIAGNOSIS — S3991XA Unspecified injury of abdomen, initial encounter: Secondary | ICD-10-CM | POA: Diagnosis not present

## 2021-01-25 DIAGNOSIS — R519 Headache, unspecified: Secondary | ICD-10-CM | POA: Diagnosis present

## 2021-01-25 DIAGNOSIS — R42 Dizziness and giddiness: Secondary | ICD-10-CM | POA: Diagnosis not present

## 2021-01-25 DIAGNOSIS — E785 Hyperlipidemia, unspecified: Secondary | ICD-10-CM | POA: Diagnosis present

## 2021-01-25 LAB — DIFFERENTIAL
Abs Immature Granulocytes: 0.04 10*3/uL (ref 0.00–0.07)
Basophils Absolute: 0.1 10*3/uL (ref 0.0–0.1)
Basophils Relative: 1 %
Eosinophils Absolute: 0.2 10*3/uL (ref 0.0–0.5)
Eosinophils Relative: 2 %
Immature Granulocytes: 0 %
Lymphocytes Relative: 21 %
Lymphs Abs: 2.4 10*3/uL (ref 0.7–4.0)
Monocytes Absolute: 1.2 10*3/uL — ABNORMAL HIGH (ref 0.1–1.0)
Monocytes Relative: 11 %
Neutro Abs: 7.6 10*3/uL (ref 1.7–7.7)
Neutrophils Relative %: 65 %

## 2021-01-25 LAB — URINALYSIS, ROUTINE W REFLEX MICROSCOPIC
Bacteria, UA: NONE SEEN
Bilirubin Urine: NEGATIVE
Glucose, UA: NEGATIVE mg/dL
Ketones, ur: 20 mg/dL — AB
Leukocytes,Ua: NEGATIVE
Nitrite: NEGATIVE
Protein, ur: 30 mg/dL — AB
Specific Gravity, Urine: 1.017 (ref 1.005–1.030)
pH: 6 (ref 5.0–8.0)

## 2021-01-25 LAB — COMPREHENSIVE METABOLIC PANEL
ALT: 17 U/L (ref 0–44)
AST: 21 U/L (ref 15–41)
Albumin: 4.6 g/dL (ref 3.5–5.0)
Alkaline Phosphatase: 87 U/L (ref 38–126)
Anion gap: 10 (ref 5–15)
BUN: 16 mg/dL (ref 6–20)
CO2: 26 mmol/L (ref 22–32)
Calcium: 9.7 mg/dL (ref 8.9–10.3)
Chloride: 105 mmol/L (ref 98–111)
Creatinine, Ser: 1.09 mg/dL — ABNORMAL HIGH (ref 0.44–1.00)
GFR, Estimated: 60 mL/min — ABNORMAL LOW (ref >60)
Glucose, Bld: 93 mg/dL (ref 70–99)
Potassium: 3.5 mmol/L (ref 3.5–5.1)
Sodium: 141 mmol/L (ref 135–145)
Total Bilirubin: 0.9 mg/dL (ref 0.3–1.2)
Total Protein: 8.4 g/dL — ABNORMAL HIGH (ref 6.5–8.1)

## 2021-01-25 LAB — CBC
HCT: 53.4 % — ABNORMAL HIGH (ref 36.0–46.0)
Hemoglobin: 17.2 g/dL — ABNORMAL HIGH (ref 12.0–15.0)
MCH: 29.1 pg (ref 26.0–34.0)
MCHC: 32.2 g/dL (ref 30.0–36.0)
MCV: 90.4 fL (ref 80.0–100.0)
Platelets: 342 10*3/uL (ref 150–400)
RBC: 5.91 MIL/uL — ABNORMAL HIGH (ref 3.87–5.11)
RDW: 14.6 % (ref 11.5–15.5)
WBC: 11.5 10*3/uL — ABNORMAL HIGH (ref 4.0–10.5)
nRBC: 0 % (ref 0.0–0.2)

## 2021-01-25 LAB — I-STAT CHEM 8, ED
BUN: 17 mg/dL (ref 6–20)
Calcium, Ion: 1.14 mmol/L — ABNORMAL LOW (ref 1.15–1.40)
Chloride: 107 mmol/L (ref 98–111)
Creatinine, Ser: 1.1 mg/dL — ABNORMAL HIGH (ref 0.44–1.00)
Glucose, Bld: 91 mg/dL (ref 70–99)
HCT: 54 % — ABNORMAL HIGH (ref 36.0–46.0)
Hemoglobin: 18.4 g/dL — ABNORMAL HIGH (ref 12.0–15.0)
Potassium: 3.6 mmol/L (ref 3.5–5.1)
Sodium: 145 mmol/L (ref 135–145)
TCO2: 24 mmol/L (ref 22–32)

## 2021-01-25 LAB — RESP PANEL BY RT-PCR (FLU A&B, COVID) ARPGX2
Influenza A by PCR: NEGATIVE
Influenza B by PCR: NEGATIVE
SARS Coronavirus 2 by RT PCR: NEGATIVE

## 2021-01-25 LAB — APTT: aPTT: 33 seconds (ref 24–36)

## 2021-01-25 LAB — RAPID URINE DRUG SCREEN, HOSP PERFORMED
Amphetamines: NOT DETECTED
Barbiturates: NOT DETECTED
Benzodiazepines: POSITIVE — AB
Cocaine: NOT DETECTED
Opiates: POSITIVE — AB
Tetrahydrocannabinol: POSITIVE — AB

## 2021-01-25 LAB — PROTIME-INR
INR: 1 (ref 0.8–1.2)
Prothrombin Time: 13.1 seconds (ref 11.4–15.2)

## 2021-01-25 LAB — ETHANOL: Alcohol, Ethyl (B): <10 mg/dL (ref <10)

## 2021-01-25 LAB — POC URINE PREG, ED: Preg Test, Ur: NEGATIVE

## 2021-01-25 MED ORDER — SENNOSIDES-DOCUSATE SODIUM 8.6-50 MG PO TABS: 1.0000 | ORAL_TABLET | Freq: Every evening | ORAL | Status: DC | PRN

## 2021-01-25 MED ORDER — ALPRAZOLAM 0.5 MG PO TABS
0.5000 mg | ORAL_TABLET | Freq: Three times a day (TID) | ORAL | Status: DC | PRN
  Administered 2021-01-26: 0.5 mg via ORAL
  Filled 2021-01-25: qty 1

## 2021-01-25 MED ORDER — TIZANIDINE HCL 4 MG PO TABS: 4.0000 mg | ORAL_TABLET | Freq: Four times a day (QID) | ORAL | Status: DC | PRN

## 2021-01-25 MED ORDER — MECLIZINE HCL 12.5 MG PO TABS
25.0000 mg | ORAL_TABLET | Freq: Three times a day (TID) | ORAL | Status: DC | PRN
  Administered 2021-01-31: 25 mg via ORAL
  Filled 2021-01-25: qty 2

## 2021-01-25 MED ORDER — LEVOTHYROXINE SODIUM 75 MCG PO TABS
75.0000 ug | ORAL_TABLET | Freq: Every day | ORAL | Status: DC
  Administered 2021-01-26 – 2021-01-31 (×5): 75 ug via ORAL
  Filled 2021-01-25 (×6): qty 1

## 2021-01-25 MED ORDER — ATORVASTATIN CALCIUM 20 MG PO TABS
20.0000 mg | ORAL_TABLET | Freq: Every day | ORAL | Status: DC
  Administered 2021-01-25 – 2021-01-26 (×2): 20 mg via ORAL
  Filled 2021-01-25 (×2): qty 1

## 2021-01-25 MED ORDER — STROKE: EARLY STAGES OF RECOVERY BOOK: Freq: Once | Status: AC

## 2021-01-25 MED ORDER — OXYCODONE HCL 5 MG PO TABS
15.0000 mg | ORAL_TABLET | ORAL | Status: DC | PRN
  Administered 2021-01-27 – 2021-01-31 (×11): 15 mg via ORAL
  Filled 2021-01-25 (×12): qty 3

## 2021-01-25 MED ORDER — ACETAMINOPHEN 160 MG/5ML PO SOLN: 650.0000 mg | ORAL | Status: DC | PRN

## 2021-01-25 MED ORDER — ACETAMINOPHEN 650 MG RE SUPP: 650.0000 mg | RECTAL | Status: DC | PRN

## 2021-01-25 MED ORDER — HYDROCORTISONE 20 MG PO TABS
20.0000 mg | ORAL_TABLET | Freq: Every day | ORAL | Status: DC
  Filled 2021-01-25 (×4): qty 1

## 2021-01-25 MED ORDER — ZOLPIDEM TARTRATE 5 MG PO TABS: 10.0000 mg | ORAL_TABLET | Freq: Every day | ORAL | Status: DC

## 2021-01-25 MED ORDER — GABAPENTIN 300 MG PO CAPS
600.0000 mg | ORAL_CAPSULE | Freq: Four times a day (QID) | ORAL | Status: DC
  Administered 2021-01-25 – 2021-01-31 (×19): 600 mg via ORAL
  Filled 2021-01-25 (×19): qty 2

## 2021-01-25 MED ORDER — ONDANSETRON HCL 4 MG/2ML IJ SOLN
4.0000 mg | Freq: Once | INTRAMUSCULAR | Status: AC
  Administered 2021-01-25: 4 mg via INTRAVENOUS
  Filled 2021-01-25: qty 2

## 2021-01-25 MED ORDER — MORPHINE SULFATE (PF) 4 MG/ML IV SOLN
4.0000 mg | Freq: Once | INTRAVENOUS | Status: AC
  Administered 2021-01-25: 4 mg via INTRAVENOUS
  Filled 2021-01-25: qty 1

## 2021-01-25 MED ORDER — ACETAMINOPHEN 325 MG PO TABS
650.0000 mg | ORAL_TABLET | ORAL | Status: DC | PRN
  Administered 2021-01-26 – 2021-01-27 (×2): 650 mg via ORAL
  Filled 2021-01-25: qty 2

## 2021-01-25 MED ORDER — GABAPENTIN 600 MG PO TABS
600.0000 mg | ORAL_TABLET | Freq: Four times a day (QID) | ORAL | Status: DC
  Filled 2021-01-25 (×3): qty 1

## 2021-01-25 MED ORDER — BUPROPION HCL ER (SR) 150 MG PO TB12
150.0000 mg | ORAL_TABLET | Freq: Two times a day (BID) | ORAL | Status: DC
  Administered 2021-01-25 – 2021-01-26 (×2): 150 mg via ORAL
  Filled 2021-01-25 (×2): qty 1

## 2021-01-25 MED ORDER — ENOXAPARIN SODIUM 40 MG/0.4ML IJ SOSY
40.0000 mg | PREFILLED_SYRINGE | INTRAMUSCULAR | Status: DC
  Administered 2021-01-25 – 2021-01-30 (×6): 40 mg via SUBCUTANEOUS
  Filled 2021-01-25 (×6): qty 0.4

## 2021-01-25 MED ORDER — HYDROCORTISONE 10 MG PO TABS: 10.0000 mg | ORAL_TABLET | ORAL | Status: DC

## 2021-01-25 MED ORDER — HYDROCORTISONE 10 MG PO TABS
10.0000 mg | ORAL_TABLET | Freq: Every day | ORAL | Status: DC
  Administered 2021-01-25: 10 mg via ORAL
  Filled 2021-01-25 (×3): qty 1

## 2021-01-25 MED ORDER — ZOLPIDEM TARTRATE 5 MG PO TABS
5.0000 mg | ORAL_TABLET | Freq: Every day | ORAL | Status: DC
  Administered 2021-01-25 – 2021-01-29 (×4): 5 mg via ORAL
  Filled 2021-01-25 (×4): qty 1

## 2021-01-25 NOTE — ED Notes (Signed)
Patient's C-collar removed per Dr Hayden Pedro approval.

## 2021-01-25 NOTE — H&P (Signed)
History and Physical  [name]:[id number] DOB: 1965/10/06 DOA: 01/25/2021  Referring physician: Dr Laverta Baltimore, ED physician PCP: Redmond School, MD  Outpatient Specialists:   Patient Coming From: home  Chief Complaint: multiple falls,   HPI: [name] is a 55 y.o. female with a history of hypertension, Addison's disease on chronic steroids, degenerative disc disease with chronic pain in her neck and her lumbar spine, hypothyroidism, hyperlipidemia, GERD, coronary artery disease.  In some medical records, there is documentation of multiple sclerosis, although there are is an MRI in care everywhere with no evidence of demyelinating disease.  Patient presents with this, word finding difficulties, multiple falls over the past 3 days.  The dizziness and word finding difficulty started 3 days ago and the fall started yesterday.  She attributes the falls to increasing weakness of her legs.  No palliating or provoking factors.  She has hit her head and neck and has had increasing back pain because of the falls.  She does have an impending surgery with Dr. Ronnald Ramp in the near future.  Denies chest pain, shortness of breath, fevers, chills, nausea, vomiting.  No new changes in medications.  Emergency Department Course: Head CT negative.  Blood work relatively unremarkable.  UDS is positive for opiates, benzodiazepines, THC.  UA normal.  Review of Systems:  Pt denies any fevers, chills, nausea, vomiting, diarrhea, constipation, abdominal pain, shortness of breath, dyspnea on exertion, orthopnea, cough, wheezing, palpitations, headache, vision changes, melena, rectal bleeding.  Review of systems are otherwise negative  Past Medical History:  Diagnosis Date  . Addison's disease ([place])   . Adrenal insufficiency ([place])    diagnosed 2012  . Aneurysm ([place])   . Anxiety   . Arthritis   . Astigmatism   . CAD (coronary artery disease)    Cath 2008 EF normal. RCA 50-60, Septal 50%. Myoview 3/12: EF 53%  normal perfusion  . Cardiac arrest ([place])    2/2 adissonian crisis  . Cardiomyopathy    resolved  . Chest pain    chronicc  . CHF (congestive heart failure) ([place])   . Chronic back pain   . Chronic diarrhea   . Concussion    sept 28th 2014  . Family history of breast cancer   . Family history of colon cancer   . Gastroparesis   . GERD (gastroesophageal reflux disease)   . HTN (hypertension)   . Hyperlipidemia   . Hypothyroidism   . Mitral valve prolapse   . Nondiabetic gastroparesis   . PONV (postoperative nausea and vomiting)   . QT prolongation   . Tobacco abuse    down to 2 cigarettes per day  . Vertigo    Past Surgical History:  Procedure Laterality Date  . ABDOMINAL HYSTERECTOMY    . CARDIAC CATHETERIZATION  03/2007   showed 60% lesion in the right coronary artery  . CHOLECYSTECTOMY    . LEFT HEART CATHETERIZATION WITH CORONARY ANGIOGRAM N/A 04/13/2012   Procedure: LEFT HEART CATHETERIZATION WITH CORONARY ANGIOGRAM;  Surgeon: Hillary Bow, MD;  Location: [facility] CATH LAB;  Service: Cardiovascular;  Laterality: N/A;  . LUMBAR LAMINECTOMY/DECOMPRESSION MICRODISCECTOMY Left 12/20/2018   Procedure: Left Lumbar Three-Four Lumbar Laminotomy and Foraminotomy, Lumbar Four-Five Laminotomy and Foraminotomy with Microdiscectomy and Resection of Synovial Cyst;  Surgeon: Jovita Gamma, MD;  Location: [facility];  Service: Neurosurgery;  Laterality: Left;  Left Lumbar 3-4 Lumbar laminotomy, foraminotomy, possible microdiscectomy with possible resection of synovial cyst  . SPINE SURGERY    .  VARICOSE VEIN SURGERY    . VESICOVAGINAL FISTULA CLOSURE W/ TAH     Social History:  reports that she quit smoking about 3 months ago. Her smoking use included cigarettes. She has a 2.50 pack-year smoking history. She has never used smokeless tobacco. She reports that she does not drink alcohol and does not use drugs. Patient lives at home  Allergies  Allergen Reactions  . Bee Venom Anaphylaxis  .  Doxycycline Other (See Comments) and Nausea Only    Due to Pre-Existing conditions involved with stomach, patient does not take the following medication  . Erythromycin Other (See Comments) and Nausea And Vomiting    Due to Pre-Existing conditions involved with stomach, patient does not take the following medication  . Ibuprofen Nausea Only    gastroparesis   . Penicillins Anaphylaxis and Swelling    Has patient had a PCN reaction causing immediate rash, facial/tongue/throat swelling, SOB or lightheadedness with hypotension: Yes Has patient had a PCN reaction causing severe rash involving mucus membranes or skin necrosis: Yes Has patient had a PCN reaction that required hospitalization No Has patient had a PCN reaction occurring within the last 10 years: No If all of the above answers are "NO", then may proceed with Cephalosporin use.   . Sulfa Antibiotics Hives and Itching  . Cat Hair Extract Hives, Itching and Swelling    SWELLING REACTION UNSPECIFIED   . Dust Mite Extract Hives and Swelling  . Tramadol Diarrhea, Nausea And Vomiting and Nausea Only  . Morphine And Related Itching    Family History  Problem Relation Age of Onset  . Cancer Father        Colon  . Coronary artery disease Other   . Heart attack Mother        d. 49  . Breast cancer Paternal Uncle 61  . Dementia Maternal Grandmother   . Leukemia Maternal Uncle 1      Prior to Admission medications   Medication Sig Start Date End Date Taking? Authorizing Provider  albuterol (VENTOLIN HFA) 108 (90 Base) MCG/ACT inhaler Inhale 1-2 puffs into the lungs every 6 (six) hours as needed for wheezing or shortness of breath.  10/22/19   [provider]  alendronate (FOSAMAX) 70 MG tablet Take 70 mg by mouth once a week. 09/15/20   [provider]  ALPRAZolam Duanne Moron) 0.5 MG tablet Take 0.5 mg by mouth 3 (three) times daily as needed. 10/09/20   [provider]  amLODipine (NORVASC) 5 MG tablet Take 1  tablet (5 mg total) by mouth daily. 01/14/21   Hilty, Nadean Corwin, MD  aspirin 81 MG chewable tablet Chew 81 mg by mouth daily.     [provider]  atorvastatin (LIPITOR) 20 MG tablet Take 20 mg by mouth daily. 10/08/20   [provider]  buPROPion (WELLBUTRIN SR) 150 MG 12 hr tablet Take 150 mg by mouth 2 (two) times daily.    [provider]  cholecalciferol (VITAMIN D) 1000 UNITS tablet Take 1,000 Units by mouth daily.    [provider]  furosemide (LASIX) 20 MG tablet TAKE 1 TABLET BY MOUTH EVERY DAY 12/09/20   Hilty, Nadean Corwin, MD  gabapentin (NEURONTIN) 600 MG tablet Take 600 mg by mouth 4 (four) times daily. 07/25/19   [provider]  hydrocortisone (CORTEF) 10 MG tablet Take 10-20 mg by mouth See admin instructions. Takes 20 mg in the morning and 10 mg at night 04/07/16   [provider]  levothyroxine (SYNTHROID, LEVOTHROID) 75 MCG tablet Take 75 mcg by mouth daily before breakfast.    [provider]  meclizine (ANTIVERT) 25 MG tablet 1 or 2 tabs PO q8h prn dizziness Patient taking differently: Take 25-50 mg by mouth 3 (three) times daily as needed (for vertigo). 04/16/16   Francine Graven, DO  nitroGLYCERIN (NITROSTAT) 0.4 MG SL tablet Place 1 tablet (0.4 mg total) under the tongue every 5 (five) minutes as needed for chest pain. 09/17/20   Hilty, Nadean Corwin, MD  ondansetron (ZOFRAN ODT) 4 MG disintegrating tablet Take 1 tablet (4 mg total) by mouth every 8 (eight) hours as needed for nausea. 08/07/19   Roxan Hockey, MD  oxyCODONE (ROXICODONE) 15 MG immediate release tablet Take 15 mg by mouth every 4 (four) hours as needed for pain.  07/27/19   [provider]  tiZANidine (ZANAFLEX) 4 MG tablet Take 4 mg by mouth 4 (four) times daily as needed. 10/14/20   [provider]  zolpidem (AMBIEN) 10 MG tablet Take 10 mg by mouth at bedtime. 05/06/16   [provider]    Physical Exam: BP (!) 164/91   Pulse 73    Temp 99.2 F (37.3 C) (Oral)   Resp 10   Wt 80.7 kg   SpO2 98%   BMI 29.16 kg/m   . General: Middle-age female. Awake and alert and oriented x3. No acute cardiopulmonary distress.  Marland Kitchen HEENT: Normocephalic atraumatic.  Right and left ears normal in appearance.  Pupils equal, round, reactive to light. Extraocular muscles are intact. Sclerae anicteric and noninjected.  Moist mucosal membranes. No mucosal lesions.  . Neck: Neck supple without lymphadenopathy. No carotid bruits. No masses palpated.  . Cardiovascular: Regular rate with normal S1-S2 sounds. No murmurs, rubs, gallops auscultated. No JVD.  Marland Kitchen Respiratory: Good respiratory effort with no wheezes, rales, rhonchi. Lungs clear to auscultation bilaterally.  No accessory muscle use. . Abdomen: Soft, nontender, nondistended. Active bowel sounds. No masses or hepatosplenomegaly  . Skin: No rashes, lesions, or ulcerations.  Dry, warm to touch. 2+ dorsalis pedis and radial pulses. . Musculoskeletal: No calf or leg pain. All major joints not erythematous nontender.  No upper or lower joint deformation.  Good ROM.  No contractures  . Psychiatric: Intact judgment and insight. Pleasant and cooperative. . Neurologic: Patient exhibiting word finding difficulties occasionally, but is able to speak relatively fluidly.  Grip strength and right arm strength 4 out of 5.  Left upper arm normal.  Patient unable to complete leg strength testing because of pain.           Labs on Admission: I have personally reviewed following labs and imaging studies  CBC: Recent Labs  Lab 01/25/21 1451 01/25/21 1505  WBC 11.5*  --   NEUTROABS 7.6  --   HGB 17.2* 18.4*  HCT 53.4* 54.0*  MCV 90.4  --   PLT 342  --    Basic Metabolic Panel: Recent Labs  Lab 01/25/21 1451 01/25/21 1505  NA 141 145  K 3.5 3.6  CL 105 107  CO2 26  --   GLUCOSE 93 91  BUN 16 17  CREATININE 1.09* 1.10*  CALCIUM 9.7  --    GFR: Estimated Creatinine Clearance: 60.6 mL/min  (A) (by C-G formula based on SCr of 1.1 mg/dL (H)). Liver Function Tests: Recent Labs  Lab 01/25/21 1451  AST 21  ALT 17  ALKPHOS 87  BILITOT 0.9  PROT 8.4*  ALBUMIN 4.6   No  results for input(s): LIPASE, AMYLASE in the last 168 hours. No results for input(s): AMMONIA in the last 168 hours. Coagulation Profile: Recent Labs  Lab 01/25/21 1451  INR 1.0   Cardiac Enzymes: No results for input(s): CKTOTAL, CKMB, CKMBINDEX, TROPONINI in the last 168 hours. BNP (last 3 results) No results for input(s): PROBNP in the last 8760 hours. HbA1C: No results for input(s): HGBA1C in the last 72 hours. CBG: No results for input(s): GLUCAP in the last 168 hours. Lipid Profile: No results for input(s): CHOL, HDL, LDLCALC, TRIG, CHOLHDL, LDLDIRECT in the last 72 hours. Thyroid Function Tests: No results for input(s): TSH, T4TOTAL, FREET4, T3FREE, THYROIDAB in the last 72 hours. Anemia Panel: No results for input(s): VITAMINB12, FOLATE, FERRITIN, TIBC, IRON, RETICCTPCT in the last 72 hours. Urine analysis:    Component Value Date/Time   COLORURINE YELLOW 01/25/2021 [place] 01/25/2021 1653   LABSPEC 1.017 01/25/2021 1653   PHURINE 6.0 01/25/2021 1653   GLUCOSEU NEGATIVE 01/25/2021 1653   HGBUR SMALL (A) 01/25/2021 1653   BILIRUBINUR NEGATIVE 01/25/2021 1653   KETONESUR 20 (A) 01/25/2021 1653   PROTEINUR 30 (A) 01/25/2021 1653   UROBILINOGEN 1.0 08/04/2014 1030   NITRITE NEGATIVE 01/25/2021 1653   LEUKOCYTESUR NEGATIVE 01/25/2021 1653   Sepsis Labs: @LABRCNTIP (procalcitonin:4,lacticidven:4) ) Recent Results (from the past 240 hour(s))  Resp Panel by RT-PCR (Flu A&B, Covid) Nasopharyngeal Swab     Status: None   Collection Time: 01/25/21  3:40 PM   Specimen: Nasopharyngeal Swab; Nasopharyngeal(NP) swabs in vial transport medium  Result Value Ref Range Status   SARS Coronavirus 2 by RT PCR NEGATIVE NEGATIVE Final    Comment: (NOTE) SARS-CoV-2 target nucleic acids  are NOT DETECTED.  The SARS-CoV-2 RNA is generally detectable in upper respiratory specimens during the acute phase of infection. The lowest concentration of SARS-CoV-2 viral copies this assay can detect is 138 copies/mL. A negative result does not preclude SARS-Cov-2 infection and should not be used as the sole basis for treatment or other patient management decisions. A negative result may occur with  improper specimen collection/handling, submission of specimen other than nasopharyngeal swab, presence of viral mutation(s) within the areas targeted by this assay, and inadequate number of viral copies(<138 copies/mL). A negative result must be combined with clinical observations, patient history, and epidemiological information. The expected result is Negative.  Fact Sheet for Patients:  EntrepreneurPulse.com.au  Fact Sheet for Healthcare Providers:  IncredibleEmployment.be  This test is no t yet approved or cleared by the [place] FDA and  has been authorized for detection and/or diagnosis of SARS-CoV-2 by FDA under an Emergency Use Authorization (EUA). This EUA will remain  in effect (meaning this test can be used) for the duration of the COVID-19 declaration under Section 564(b)(1) of the Act, 21 U.S.C.section 360bbb-3(b)(1), unless the authorization is terminated  or revoked sooner.       Influenza A by PCR NEGATIVE NEGATIVE Final   Influenza B by PCR NEGATIVE NEGATIVE Final    Comment: (NOTE) The Xpert Xpress SARS-CoV-2/FLU/RSV plus assay is intended as an aid in the diagnosis of influenza from Nasopharyngeal swab specimens and should not be used as a sole basis for treatment. Nasal washings and aspirates are unacceptable for Xpert Xpress SARS-CoV-2/FLU/RSV testing.  Fact Sheet for Patients: EntrepreneurPulse.com.au  Fact Sheet for Healthcare Providers: IncredibleEmployment.be  This test is  not yet approved or cleared by the [place] FDA and has been authorized for detection and/or diagnosis of SARS-CoV-2 by  FDA under an Emergency Use Authorization (EUA). This EUA will remain in effect (meaning this test can be used) for the duration of the COVID-19 declaration under Section 564(b)(1) of the Act, 21 U.S.C. section 360bbb-3(b)(1), unless the authorization is terminated or revoked.  Performed at [facility], [address] [place], [place] [address]      Radiological Exams on Admission: CT ABDOMEN PELVIS WO CONTRAST  Result Date: 01/25/2021 CLINICAL DATA:  Fall.  Abdominal trauma. EXAM: CT ABDOMEN AND PELVIS WITHOUT CONTRAST TECHNIQUE: Multidetector CT imaging of the abdomen and pelvis was performed following the standard protocol without IV contrast. COMPARISON:  10/24/2019 FINDINGS: Lower chest: No acute abnormality. Hepatobiliary: Prior cholecystectomy. No focal hepatic abnormality or evidence of perihepatic hematoma. Pancreas: No focal abnormality or ductal dilatation. Spleen: No splenic injury or perisplenic hematoma. Adrenals/Urinary Tract: No adrenal hemorrhage or renal injury identified. Bladder is unremarkable. Stomach/Bowel: Moderate stool burden throughout the colon. Stomach, large and small bowel grossly unremarkable. Normal appendix. Vascular/Lymphatic: Aortic atherosclerosis. No evidence of aneurysm or adenopathy. Reproductive: Prior hysterectomy.  No adnexal masses. Other: No free fluid or free air. Musculoskeletal: No acute bony abnormality. IMPRESSION: No acute findings in the abdomen or pelvis. Aortic atherosclerosis. Moderate stool burden. Electronically Signed   By: Rolm Baptise M.D.   On: 01/25/2021 14:46   CT HEAD WO CONTRAST  Result Date: 01/25/2021 CLINICAL DATA:  Neuro deficit. Acute stroke suspected. Dizziness and weakness. Ten falls yesterday. Pain. EXAM: CT HEAD WITHOUT CONTRAST CT CERVICAL SPINE WITHOUT CONTRAST TECHNIQUE: Multidetector CT imaging of  the head and cervical spine was performed following the standard protocol without intravenous contrast. Multiplanar CT image reconstructions of the cervical spine were also generated. COMPARISON:  None. FINDINGS: CT HEAD FINDINGS Brain: No subdural, epidural, or subarachnoid hemorrhage. Cerebellum, brainstem, and basal cisterns are normal. Ventricles and sulci are unremarkable. No mass effect or midline shift. No acute cortical ischemia or infarct. Vascular: No hyperdense vessel or unexpected calcification. Skull: Normal. Negative for fracture or focal lesion. Sinuses/Orbits: No acute finding. Other: None. CT CERVICAL SPINE FINDINGS Alignment: Evaluation is somewhat limited due to motion. Within the limitations of motion, no traumatic malalignment is identified. Skull base and vertebrae: No acute fracture. No primary bone lesion or focal pathologic process. Soft tissues and spinal canal: No prevertebral fluid or swelling. No visible canal hematoma. Disc levels: Patient is status post anterior plate screw fusion C5 through C7. Hardware is in good position. Mild degenerative disc disease with small anterior and posterior osteophytes. Upper chest: Negative. Other: No other abnormalities. IMPRESSION: 1. No acute intracranial abnormalities identified. 2. Evaluation of the cervical spine is mildly limited by motion. Within these limitations, no fracture or traumatic malalignment identified. Electronically Signed   By: Dorise Bullion III M.D   On: 01/25/2021 14:56   CT Cervical Spine Wo Contrast  Result Date: 01/25/2021 CLINICAL DATA:  Neuro deficit. Acute stroke suspected. Dizziness and weakness. Ten falls yesterday. Pain. EXAM: CT HEAD WITHOUT CONTRAST CT CERVICAL SPINE WITHOUT CONTRAST TECHNIQUE: Multidetector CT imaging of the head and cervical spine was performed following the standard protocol without intravenous contrast. Multiplanar CT image reconstructions of the cervical spine were also generated.  COMPARISON:  None. FINDINGS: CT HEAD FINDINGS Brain: No subdural, epidural, or subarachnoid hemorrhage. Cerebellum, brainstem, and basal cisterns are normal. Ventricles and sulci are unremarkable. No mass effect or midline shift. No acute cortical ischemia or infarct. Vascular: No hyperdense vessel or unexpected calcification. Skull: Normal. Negative for fracture or focal lesion. Sinuses/Orbits: No acute  finding. Other: None. CT CERVICAL SPINE FINDINGS Alignment: Evaluation is somewhat limited due to motion. Within the limitations of motion, no traumatic malalignment is identified. Skull base and vertebrae: No acute fracture. No primary bone lesion or focal pathologic process. Soft tissues and spinal canal: No prevertebral fluid or swelling. No visible canal hematoma. Disc levels: Patient is status post anterior plate screw fusion C5 through C7. Hardware is in good position. Mild degenerative disc disease with small anterior and posterior osteophytes. Upper chest: Negative. Other: No other abnormalities. IMPRESSION: 1. No acute intracranial abnormalities identified. 2. Evaluation of the cervical spine is mildly limited by motion. Within these limitations, no fracture or traumatic malalignment identified. Electronically Signed   By: Dorise Bullion III M.D   On: 01/25/2021 14:56    EKG: Independently reviewed.  Sinus rhythm with left atrial enlargement.  Mild ST depression in lateral leads.  Unchanged from prior.  No acute ST changes  Assessment/Plan: Active Problems:   Hypothyroidism   Essential hypertension   QT prolongation   Addison's disease (HCC)   CAD (coronary artery disease)   GERD (gastroesophageal reflux disease)   Aphasia   Degeneration of lumbar intervertebral disc   Multiple falls    This patient was discussed with the ED physician, including pertinent vitals, physical exam findings, labs, and imaging.  We also discussed care given by the ED provider.  1. Aphasia Question whether  this represents stroke especially with especially with the leg weakness and falls. Observation on telemetry MRI/MRA head Carotid Dopplers  Echocardiogram tomorrow Hemoglobin A1c, lipid panel in the morning PT/OT/speech therapy consult Full aspirin a. We will have neurology evaluate the patient b. Permissive hypertension. 2. Multiple falls a. PT consult 3. Degenerative disc disease a. We will give home pain medicine regimen 4. Hypertension a. Permissive hypertension 5. Hypothyroidism a. Continue Synthroid 6. Addison's disease a. Hydrocortisone 7. Coronary artery disease a.  8. GERD 9. QT prolongation a. We will watch medications  DVT prophylaxis: Lovenox Consultants: Neurology Code Status: Full code  Family Communication: None Disposition Plan: Patient should be able to return home   Truett Mainland, DO

## 2021-01-25 NOTE — ED Provider Notes (Signed)
Emergency Department Provider Note   I have reviewed the triage vital signs and the nursing notes.   HISTORY  Chief Complaint Dizziness   HPI [name] is a 55 y.o. female past medical history reviewed below presents to the emergency department with dizziness and multiple falls over the past 3 days.  Patient states she has developed vertigo symptoms over the past 3 days with some difficulty getting out her words.  Yesterday she began having falls whenever she would try and walk.  She estimates that she fell approximately 10 times and on certain occasions did hit her head and neck.  She has had some headache after these falls.  She continues to have trouble with her words and is feeling weak on the right side as well as some numbness in her arm and leg.  She does have chronic neck and back pain and follows with Dr. Ronnald Ramp with plan for surgery in the near future but missed an appointment on Friday due to early symptoms.  She is not having chest pain, shortness of breath, abdominal discomfort.  Denies any new medications.  No obvious vision changes. Arrives by EMS with c-collar in place.   Past Medical History:  Diagnosis Date  . Addison's disease ([place])   . Addison's disease ([place])   . Adrenal insufficiency ([place])    diagnosed 2012  . Aneurysm ([place])   . Anxiety   . Arthritis   . Astigmatism   . CAD (coronary artery disease)    Cath 2008 EF normal. RCA 50-60, Septal 50%. Myoview 3/12: EF 53% normal perfusion  . Cardiac arrest ([place])    2/2 adissonian crisis  . Cardiomyopathy    resolved  . Chest pain    chronicc  . CHF (congestive heart failure) ([place])   . Chronic back pain   . Chronic diarrhea   . Concussion    sept 28th 2014  . Family history of breast cancer   . Family history of colon cancer   . Gastroparesis   . GERD (gastroesophageal reflux disease)   . HTN (hypertension)   . Hyperlipidemia   . Hypothyroidism   . Mitral valve prolapse   . Nondiabetic gastroparesis    . PONV (postoperative nausea and vomiting)   . QT prolongation   . Tobacco abuse    down to 2 cigarettes per day  . Vertigo     Patient Active Problem List   Diagnosis Date Noted  . Acute CVA/punctate acute or subacute infarction in the lower Lt Paramedian [facility] Brain 01/26/2021  . Acute metabolic encephalopathy 84/16/6063  . Aphasia 01/25/2021  . Multiple falls 01/25/2021  . Genetic testing 12/31/2019  . AKI (acute kidney injury) ([place]) 08/06/2019  . Emesis, persistent 08/06/2019  . Viral syndrome 08/05/2019  . Tobacco abuse   . History of colonic polyps 07/23/2019  . Family history of breast cancer   . Family history of colon cancer   . Synovial cyst of lumbar spine 12/20/2018  . Degeneration of lumbar intervertebral disc 12/01/2018  . Myositis 05/11/2016  . Anxiety 05/26/2015  . Tremors of nervous system 05/26/2015  . Neck pain 05/26/2015  . Hot flushes, perimenopausal 07/24/2013  . CAD (coronary artery disease) 04/11/2012  . GERD (gastroesophageal reflux disease) 04/11/2012  . Addison's disease ([place]) 03/03/2012  . QT prolongation 12/15/2010  . Palpitations 01/01/2009  . Hypothyroidism 12/31/2008  . Hyperlipidemia 12/31/2008  . OVERWEIGHT/OBESITY 12/31/2008  . Essential hypertension 12/31/2008    Past Surgical History:  Procedure  Laterality Date  . ABDOMINAL HYSTERECTOMY    . CARDIAC CATHETERIZATION  03/2007   showed 60% lesion in the right coronary artery  . CHOLECYSTECTOMY    . LEFT HEART CATHETERIZATION WITH CORONARY ANGIOGRAM N/A 04/13/2012   Procedure: LEFT HEART CATHETERIZATION WITH CORONARY ANGIOGRAM;  Surgeon: Hillary Bow, MD;  Location: [facility] CATH LAB;  Service: Cardiovascular;  Laterality: N/A;  . LUMBAR LAMINECTOMY/DECOMPRESSION MICRODISCECTOMY Left 12/20/2018   Procedure: Left Lumbar Three-Four Lumbar Laminotomy and Foraminotomy, Lumbar Four-Five Laminotomy and Foraminotomy with Microdiscectomy and Resection of Synovial Cyst;  Surgeon: Jovita Gamma,  MD;  Location: [facility];  Service: Neurosurgery;  Laterality: Left;  Left Lumbar 3-4 Lumbar laminotomy, foraminotomy, possible microdiscectomy with possible resection of synovial cyst  . SPINE SURGERY    . VARICOSE VEIN SURGERY    . VESICOVAGINAL FISTULA CLOSURE W/ TAH      Allergies Bee venom, Doxycycline, Erythromycin, Ibuprofen, Penicillins, Sulfa antibiotics, Cat hair extract, Dust mite extract, Tramadol, and Morphine and related  Family History  Problem Relation Age of Onset  . Cancer Father        Colon  . Coronary artery disease Other   . Heart attack Mother        d. 69  . Breast cancer Paternal Uncle 44  . Dementia Maternal Grandmother   . Leukemia Maternal Uncle 1    Social History Social History   Tobacco Use  . Smoking status: Former Smoker    Packs/day: 0.25    Years: 10.00    Pack years: 2.50    Types: Cigarettes    Quit date: 01/25/2021    Years since quitting: 0.0  . Smokeless tobacco: Never Used  . Tobacco comment: smokes 2 a day  Vaping Use  . Vaping Use: Former  . Substances: Nicotine, CBD  Substance Use Topics  . Alcohol use: No  . Drug use: Yes    Types: Marijuana, Oxycodone    Comment: Hisrory of Cocaine use     Review of Systems  Constitutional: No fever/chills Eyes: No visual changes. ENT: No sore throat. Positive vertigo.  Cardiovascular: Denies chest pain. Respiratory: Denies shortness of breath. Gastrointestinal: No abdominal pain.  No nausea, no vomiting.  No diarrhea.  No constipation. Genitourinary: Negative for dysuria. Musculoskeletal: Chronic neck and back pain.  Skin: Negative for rash. Neurological: Positive HA with numbness/weakness in right leg > right arm.   10-point ROS otherwise negative.  ____________________________________________   PHYSICAL EXAM:  VITAL SIGNS: ED Triage Vitals  Enc Vitals Group     BP 01/25/21 1347 (!) 161/107     Pulse Rate 01/25/21 1347 73     Resp 01/25/21 1347 15     Temp 01/25/21 1347  99.2 F (37.3 C)     Temp Source 01/25/21 1347 Oral     SpO2 01/25/21 1347 100 %     Weight 01/25/21 1342 177 lb 14.6 oz (80.7 kg)   Constitutional: Alert and oriented. Patient with significant word findings difficulty but able to provide a history.  Eyes: Conjunctivae are normal. PERRL.  Head: Atraumatic. Nose: No congestion/rhinnorhea. Mouth/Throat: Mucous membranes are moist. Neck: No stridor. C collar in place.  Cardiovascular: Normal rate, regular rhythm. Good peripheral circulation. Grossly normal heart sounds.   Respiratory: Normal respiratory effort.  No retractions. Lungs CTAB. Gastrointestinal: Soft and nontender. No distention.  Musculoskeletal: No gross deformities of extremities. Neurologic: Patient with significant word finding difficulty and some mild dysarthria.  She has no facial asymmetry or sensory deficit.  She seems more globally weak throughout but does have some increased weakness especially in the right lower extremity compared to the left.  Intermittent tremor and coordination difficulty with the right upper extremity.  Skin:  Skin is warm, dry and intact. No rash noted.   ____________________________________________   LABS (all labs ordered are listed, but only abnormal results are displayed)  Labs Reviewed  CBC - Abnormal; Notable for the following components:      Result Value   WBC 11.5 (*)    RBC 5.91 (*)    Hemoglobin 17.2 (*)    HCT 53.4 (*)    All other components within normal limits  DIFFERENTIAL - Abnormal; Notable for the following components:   Monocytes Absolute 1.2 (*)    All other components within normal limits  COMPREHENSIVE METABOLIC PANEL - Abnormal; Notable for the following components:   Creatinine, Ser 1.09 (*)    Total Protein 8.4 (*)    GFR, Estimated 60 (*)    All other components within normal limits  RAPID URINE DRUG SCREEN, HOSP PERFORMED - Abnormal; Notable for the following components:   Opiates POSITIVE (*)     Benzodiazepines POSITIVE (*)    Tetrahydrocannabinol POSITIVE (*)    All other components within normal limits  URINALYSIS, ROUTINE W REFLEX MICROSCOPIC - Abnormal; Notable for the following components:   Hgb urine dipstick SMALL (*)    Ketones, ur 20 (*)    Protein, ur 30 (*)    All other components within normal limits  HEMOGLOBIN A1C - Abnormal; Notable for the following components:   Hgb A1c MFr Bld 5.7 (*)    All other components within normal limits  LIPID PANEL - Abnormal; Notable for the following components:   Triglycerides 163 (*)    All other components within normal limits  CBC WITH DIFFERENTIAL/PLATELET - Abnormal; Notable for the following components:   WBC 11.3 (*)    RBC 5.65 (*)    Hemoglobin 16.6 (*)    HCT 50.8 (*)    Monocytes Absolute 1.1 (*)    All other components within normal limits  BASIC METABOLIC PANEL - Abnormal; Notable for the following components:   Glucose, Bld 113 (*)    Creatinine, Ser 1.15 (*)    GFR, Estimated 56 (*)    All other components within normal limits  TSH - Abnormal; Notable for the following components:   TSH 0.348 (*)    All other components within normal limits  BASIC METABOLIC PANEL - Abnormal; Notable for the following components:   Glucose, Bld 122 (*)    BUN 33 (*)    Creatinine, Ser 1.29 (*)    Calcium 8.7 (*)    GFR, Estimated 49 (*)    All other components within normal limits  GLUCOSE, CAPILLARY - Abnormal; Notable for the following components:   Glucose-Capillary 147 (*)    All other components within normal limits  GLUCOSE, CAPILLARY - Abnormal; Notable for the following components:   Glucose-Capillary 155 (*)    All other components within normal limits  GLUCOSE, CAPILLARY - Abnormal; Notable for the following components:   Glucose-Capillary 141 (*)    All other components within normal limits  GLUCOSE, CAPILLARY - Abnormal; Notable for the following components:   Glucose-Capillary 169 (*)    All other  components within normal limits  GLUCOSE, CAPILLARY - Abnormal; Notable for the following components:   Glucose-Capillary 122 (*)    All other components within normal limits  URINALYSIS,  ROUTINE W REFLEX MICROSCOPIC - Abnormal; Notable for the following components:   Hgb urine dipstick SMALL (*)    Protein, ur 30 (*)    All other components within normal limits  GLUCOSE, CAPILLARY - Abnormal; Notable for the following components:   Glucose-Capillary 142 (*)    All other components within normal limits  GLUCOSE, CAPILLARY - Abnormal; Notable for the following components:   Glucose-Capillary 147 (*)    All other components within normal limits  GLUCOSE, CAPILLARY - Abnormal; Notable for the following components:   Glucose-Capillary 131 (*)    All other components within normal limits  I-STAT CHEM 8, ED - Abnormal; Notable for the following components:   Creatinine, Ser 1.10 (*)    Calcium, Ion 1.14 (*)    Hemoglobin 18.4 (*)    HCT 54.0 (*)    All other components within normal limits  RESP PANEL BY RT-PCR (FLU A&B, COVID) ARPGX2  URINE CULTURE  ETHANOL  PROTIME-INR  APTT  HIV ANTIBODY (ROUTINE TESTING W REFLEX)  PROTIME-INR  AMMONIA  RPR  VITAMIN B12  HOMOCYSTEINE  POC URINE PREG, ED   ____________________________________________  EKG   EKG Interpretation  Date/Time:  Sunday Jan 25 2021 13:43:13 EDT Ventricular Rate:  73 PR Interval:  121 QRS Duration: 111 QT Interval:  474 QTC Calculation: 523 R Axis:   95 Text Interpretation: Sinus rhythm Consider left atrial enlargement Borderline right axis deviation Minimal ST depression, diffuse leads Prolonged QT interval Baseline wander in lead(s) V1 V3 Confirmed by Nanda Quinton [phone]) on 01/25/2021 2:30:00 PM       ____________________________________________  RADIOLOGY  MR LUMBAR SPINE WO CONTRAST  Result Date: 01/28/2021 CLINICAL DATA:  Low back pain, technologist note states urinary retention and worsening  chronic back pain EXAM: MRI LUMBAR SPINE WITHOUT CONTRAST TECHNIQUE: Multiplanar, multisequence MR imaging of the lumbar spine was performed. No intravenous contrast was administered. COMPARISON:  10/20/2020 FINDINGS: Segmentation:  Standard. Alignment:  Anteroposterior alignment is maintained. Vertebrae: Marrow edema associated with L4-L5 facet arthropathy. Vertebral body heights are maintained. No suspicious osseous lesion. Conus medullaris and cauda equina: Conus extends to the L1 level. Conus and cauda equina appear normal. Paraspinal and other soft tissues: Paraspinal edema associated with L4-L5 facet arthropathy. Disc levels: L1-L2:  No stenosis. L2-L3:  No stenosis. L3-L4: Prior left laminectomy. Disc bulge with increased size of left subarticular/foraminal annular fissure and shallow protrusion. Facet arthropathy with residual right ligamentum flavum infolding. No canal stenosis. Unchanged slight narrowing of the subarticular recesses. Unchanged minor right foraminal stenosis. Slightly increased mild to moderate left foraminal stenosis. L4-L5: Prior left laminectomy. Disc bulge eccentric to the left. Marked facet arthropathy with increased joint effusions. There is a more cystic-appearing 9 mm synovial cyst along the left lateral aspect of the spinal canal. Residual ligamentum flavum infolding. Similar mild canal stenosis. Similar partial effacement the right subarticular recess. Persistent effacement of the left subarticular recess with compression of traversing L5 nerve roots. Similar mild right and moderate left foraminal stenosis. L5-S1: Similar moderate right and mild left facet arthropathy. No canal or foraminal stenosis. IMPRESSION: Degenerative and prior postoperative changes as detailed above. There is no high-grade canal stenosis. Increased facet arthropathy at L4-L5 with marrow and soft tissue edema, fusions, and intraspinal synovial cyst compressing the traversing left L5 nerve roots. Slightly  increased disc protrusion/annular fissure and left foraminal stenosis at L3-L4. Electronically Signed   By: Macy Mis M.D.   On: 01/28/2021 12:01   EEG adult  Result  Date: 01/28/2021 Lora Havens, MD     01/28/2021  8:41 AM Patient Name: [name] MRN: [id number] Epilepsy Attending: Lora Havens Referring Physician/Provider: Dr Roxan Hockey Date: 01/27/2021 Duration: 23.30 mins Patient history: 54 year old female with altered mental status.  EEG to evaluate for seizures. Level of alertness: Awake AEDs during EEG study: Topiramate, zonisamide, gabapentin Technical aspects: This EEG study was done with scalp electrodes positioned according to the 10-20 International system of electrode placement. Electrical activity was acquired at a sampling rate of 500Hz  and reviewed with a high frequency filter of 70Hz  and a low frequency filter of 1Hz . EEG data were recorded continuously and digitally stored. Description: The posterior dominant rhythm consists of 9-10 Hz activity of moderate voltage (25-35 uV) seen predominantly in posterior head regions, symmetric and reactive to eye opening and eye closing. Hyperventilation and photic stimulation were not performed.   IMPRESSION: This study is within normal limits. No seizures or epileptiform discharges were seen throughout the recording. Priyanka Barbra Sarks    ____________________________________________   PROCEDURES  Procedure(s) performed:   Procedures  None  ____________________________________________   INITIAL IMPRESSION / ASSESSMENT AND PLAN / ED COURSE  Pertinent labs & imaging results that were available during my care of the patient were reviewed by me and considered in my medical decision making (see chart for details).   Patient presents to the emergency department with 3 days of vertigo with frequent falling and right-sided weakness.  I am concerned for possible stroke.  Patient also has chronic neck and back pain which is  worse in several falls.  Will obtain CT imaging of the head as well as cervical spine and abdomen pelvis to evaluate for possible spine injury.  She does not have findings on exam I am suspicious for cauda equina or other acute spine emergency.  Her exam is more consistent with possible stroke.  Symptoms started 3 days ago and so she is well outside any window for tPA or endovascular treatment.  Discussed patient's case with [facility] to request admission. Patient and family (if present) updated with plan. Care transferred to [facility] service.  I reviewed all nursing notes, vitals, pertinent old records, EKGs, labs, imaging (as available).  ____________________________________________  FINAL CLINICAL IMPRESSION(S) / ED DIAGNOSES  Final diagnoses:  Stroke-like symptoms     MEDICATIONS GIVEN DURING THIS VISIT:  Medications  oxyCODONE (Oxy IR/ROXICODONE) immediate release tablet 15 mg (15 mg Oral Given 01/28/21 0524)  levothyroxine (SYNTHROID) tablet 75 mcg (75 mcg Oral Given 01/29/21 0540)  meclizine (ANTIVERT) tablet 25 mg (has no administration in time range)  acetaminophen (TYLENOL) tablet 650 mg (650 mg Oral Given 01/27/21 1330)    Or  acetaminophen (TYLENOL) 160 MG/5ML solution 650 mg ( Per Tube See Alternative 01/27/21 1330)    Or  acetaminophen (TYLENOL) suppository 650 mg ( Rectal See Alternative 01/27/21 1330)  enoxaparin (LOVENOX) injection 40 mg (40 mg Subcutaneous Given 01/28/21 2145)  zolpidem (AMBIEN) tablet 5 mg (5 mg Oral Given 01/28/21 2146)  gabapentin (NEURONTIN) capsule 600 mg (600 mg Oral Given 01/28/21 2146)  gabapentin (NEURONTIN) capsule 300 mg (0 mg Oral Hold 01/26/21 0557)  senna-docusate (Senokot-S) tablet 2 tablet (2 tablets Oral Given 01/28/21 2145)  polyethylene glycol (MIRALAX / GLYCOLAX) packet 17 g (17 g Oral Given 01/28/21 0807)  atorvastatin (LIPITOR) tablet 40 mg (40 mg Oral Given 01/28/21 0804)  tamsulosin (FLOMAX) capsule 0.4 mg (0.4 mg Oral Given 01/28/21 1706)   dextrose 5 %-0.45 % sodium chloride infusion (  0 mLs Intravenous Stopped 01/28/21 2146)  aspirin chewable tablet 81 mg (81 mg Oral Given 01/28/21 0804)  clopidogrel (PLAVIX) tablet 75 mg (75 mg Oral Given 01/28/21 0806)  hydrocortisone (CORTEF) tablet 20 mg (20 mg Oral Given 01/28/21 2146)  carvedilol (COREG) tablet 3.125 mg (3.125 mg Oral Given 01/28/21 1706)  topiramate (TOPAMAX) tablet 25 mg (25 mg Oral Given 01/28/21 2145)  morphine 4 MG/ML injection 4 mg (4 mg Intravenous Given 01/25/21 1506)  ondansetron (ZOFRAN) injection 4 mg (4 mg Intravenous Given 01/25/21 1505)   stroke: mapping our early stages of recovery book ( Does not apply Given 01/25/21 2100)  Chlorhexidine Gluconate Cloth 2 % PADS 6 each (6 each Topical Given 01/26/21 0215)    And  Chlorhexidine Gluconate Cloth 2 % PADS 6 each (6 each Topical Given 01/26/21 0559)  dexamethasone (DECADRON) injection 10 mg (10 mg Intravenous Given 01/26/21 0555)  acetaminophen (TYLENOL) tablet 1,000 mg (1,000 mg Oral Given 01/26/21 0635)  bisacodyl (DULCOLAX) suppository 10 mg (10 mg Rectal Given 01/26/21 1707)     Note:  This document was prepared using Dragon voice recognition software and may include unintentional dictation errors.  Nanda Quinton, MD, [facility] Emergency Medicine    Woodroe Vogan, W[facility], MD 01/29/21 0700

## 2021-01-25 NOTE — ED Triage Notes (Signed)
Pt reports dizziness and weakness. Pt reports falling 10 times yesterday. Pt reports neck pain, leg pain, and arm pain. Pt also reports chronic back pain.

## 2021-01-26 ENCOUNTER — Observation Stay (HOSPITAL_COMMUNITY): Payer: Medicare HMO

## 2021-01-26 DIAGNOSIS — R531 Weakness: Secondary | ICD-10-CM | POA: Diagnosis not present

## 2021-01-26 DIAGNOSIS — Z885 Allergy status to narcotic agent status: Secondary | ICD-10-CM | POA: Diagnosis not present

## 2021-01-26 DIAGNOSIS — G9341 Metabolic encephalopathy: Secondary | ICD-10-CM | POA: Diagnosis present

## 2021-01-26 DIAGNOSIS — R4701 Aphasia: Secondary | ICD-10-CM | POA: Diagnosis not present

## 2021-01-26 DIAGNOSIS — R296 Repeated falls: Secondary | ICD-10-CM | POA: Diagnosis not present

## 2021-01-26 DIAGNOSIS — Z881 Allergy status to other antibiotic agents status: Secondary | ICD-10-CM | POA: Diagnosis not present

## 2021-01-26 DIAGNOSIS — I10 Essential (primary) hypertension: Secondary | ICD-10-CM | POA: Diagnosis not present

## 2021-01-26 DIAGNOSIS — R7309 Other abnormal glucose: Secondary | ICD-10-CM | POA: Diagnosis not present

## 2021-01-26 DIAGNOSIS — M5136 Other intervertebral disc degeneration, lumbar region: Secondary | ICD-10-CM | POA: Diagnosis not present

## 2021-01-26 DIAGNOSIS — F419 Anxiety disorder, unspecified: Secondary | ICD-10-CM | POA: Diagnosis present

## 2021-01-26 DIAGNOSIS — E785 Hyperlipidemia, unspecified: Secondary | ICD-10-CM | POA: Diagnosis present

## 2021-01-26 DIAGNOSIS — Z882 Allergy status to sulfonamides status: Secondary | ICD-10-CM | POA: Diagnosis not present

## 2021-01-26 DIAGNOSIS — I2510 Atherosclerotic heart disease of native coronary artery without angina pectoris: Secondary | ICD-10-CM | POA: Diagnosis not present

## 2021-01-26 DIAGNOSIS — I639 Cerebral infarction, unspecified: Secondary | ICD-10-CM | POA: Diagnosis not present

## 2021-01-26 DIAGNOSIS — Z880 Allergy status to penicillin: Secondary | ICD-10-CM | POA: Diagnosis not present

## 2021-01-26 DIAGNOSIS — E039 Hypothyroidism, unspecified: Secondary | ICD-10-CM | POA: Diagnosis not present

## 2021-01-26 DIAGNOSIS — I6389 Other cerebral infarction: Secondary | ICD-10-CM | POA: Diagnosis not present

## 2021-01-26 DIAGNOSIS — Z8674 Personal history of sudden cardiac arrest: Secondary | ICD-10-CM | POA: Diagnosis not present

## 2021-01-26 DIAGNOSIS — Z886 Allergy status to analgesic agent status: Secondary | ICD-10-CM | POA: Diagnosis not present

## 2021-01-26 DIAGNOSIS — R339 Retention of urine, unspecified: Secondary | ICD-10-CM | POA: Diagnosis present

## 2021-01-26 DIAGNOSIS — E7849 Other hyperlipidemia: Secondary | ICD-10-CM | POA: Diagnosis not present

## 2021-01-26 DIAGNOSIS — R1310 Dysphagia, unspecified: Secondary | ICD-10-CM | POA: Diagnosis present

## 2021-01-26 DIAGNOSIS — Z79891 Long term (current) use of opiate analgesic: Secondary | ICD-10-CM | POA: Diagnosis not present

## 2021-01-26 DIAGNOSIS — K219 Gastro-esophageal reflux disease without esophagitis: Secondary | ICD-10-CM | POA: Diagnosis not present

## 2021-01-26 DIAGNOSIS — R4182 Altered mental status, unspecified: Secondary | ICD-10-CM | POA: Diagnosis not present

## 2021-01-26 DIAGNOSIS — R519 Headache, unspecified: Secondary | ICD-10-CM | POA: Diagnosis present

## 2021-01-26 DIAGNOSIS — G8929 Other chronic pain: Secondary | ICD-10-CM | POA: Diagnosis present

## 2021-01-26 DIAGNOSIS — E271 Primary adrenocortical insufficiency: Secondary | ICD-10-CM | POA: Diagnosis not present

## 2021-01-26 DIAGNOSIS — Z20822 Contact with and (suspected) exposure to covid-19: Secondary | ICD-10-CM | POA: Diagnosis not present

## 2021-01-26 DIAGNOSIS — M545 Low back pain, unspecified: Secondary | ICD-10-CM | POA: Diagnosis not present

## 2021-01-26 DIAGNOSIS — R42 Dizziness and giddiness: Secondary | ICD-10-CM | POA: Diagnosis not present

## 2021-01-26 DIAGNOSIS — R2689 Other abnormalities of gait and mobility: Secondary | ICD-10-CM | POA: Diagnosis not present

## 2021-01-26 DIAGNOSIS — I6381 Other cerebral infarction due to occlusion or stenosis of small artery: Secondary | ICD-10-CM | POA: Diagnosis not present

## 2021-01-26 LAB — LIPID PANEL
Cholesterol: 137 mg/dL (ref 0–200)
HDL: 50 mg/dL (ref >40)
LDL Cholesterol: 54 mg/dL (ref 0–99)
Total CHOL/HDL Ratio: 2.7 RATIO
Triglycerides: 163 mg/dL — ABNORMAL HIGH (ref <150)
VLDL: 33 mg/dL (ref 0–40)

## 2021-01-26 LAB — HIV ANTIBODY (ROUTINE TESTING W REFLEX): HIV Screen 4th Generation wRfx: NONREACTIVE

## 2021-01-26 LAB — BASIC METABOLIC PANEL
Anion gap: 12 (ref 5–15)
BUN: 20 mg/dL (ref 6–20)
CO2: 22 mmol/L (ref 22–32)
Calcium: 9.3 mg/dL (ref 8.9–10.3)
Chloride: 106 mmol/L (ref 98–111)
Creatinine, Ser: 1.15 mg/dL — ABNORMAL HIGH (ref 0.44–1.00)
GFR, Estimated: 56 mL/min — ABNORMAL LOW (ref >60)
Glucose, Bld: 113 mg/dL — ABNORMAL HIGH (ref 70–99)
Potassium: 3.7 mmol/L (ref 3.5–5.1)
Sodium: 140 mmol/L (ref 135–145)

## 2021-01-26 LAB — CBC WITH DIFFERENTIAL/PLATELET
Abs Immature Granulocytes: 0.05 10*3/uL (ref 0.00–0.07)
Basophils Absolute: 0.1 10*3/uL (ref 0.0–0.1)
Basophils Relative: 1 %
Eosinophils Absolute: 0.1 10*3/uL (ref 0.0–0.5)
Eosinophils Relative: 1 %
HCT: 50.8 % — ABNORMAL HIGH (ref 36.0–46.0)
Hemoglobin: 16.6 g/dL — ABNORMAL HIGH (ref 12.0–15.0)
Immature Granulocytes: 0 %
Lymphocytes Relative: 22 %
Lymphs Abs: 2.5 10*3/uL (ref 0.7–4.0)
MCH: 29.4 pg (ref 26.0–34.0)
MCHC: 32.7 g/dL (ref 30.0–36.0)
MCV: 89.9 fL (ref 80.0–100.0)
Monocytes Absolute: 1.1 10*3/uL — ABNORMAL HIGH (ref 0.1–1.0)
Monocytes Relative: 9 %
Neutro Abs: 7.5 10*3/uL (ref 1.7–7.7)
Neutrophils Relative %: 67 %
Platelets: 348 10*3/uL (ref 150–400)
RBC: 5.65 MIL/uL — ABNORMAL HIGH (ref 3.87–5.11)
RDW: 14.6 % (ref 11.5–15.5)
WBC: 11.3 10*3/uL — ABNORMAL HIGH (ref 4.0–10.5)
nRBC: 0 % (ref 0.0–0.2)

## 2021-01-26 LAB — ECHOCARDIOGRAM LIMITED
Area-P 1/2: 4.21 cm2
S' Lateral: 2.88 cm
Weight: 2857.16 oz

## 2021-01-26 LAB — HEMOGLOBIN A1C
Hgb A1c MFr Bld: 5.7 % — ABNORMAL HIGH (ref 4.8–5.6)
Mean Plasma Glucose: 116.89 mg/dL

## 2021-01-26 LAB — PROTIME-INR
INR: 1.1 (ref 0.8–1.2)
Prothrombin Time: 14.1 seconds (ref 11.4–15.2)

## 2021-01-26 LAB — GLUCOSE, CAPILLARY: Glucose-Capillary: 147 mg/dL — ABNORMAL HIGH (ref 70–99)

## 2021-01-26 MED ORDER — CHLORHEXIDINE GLUCONATE CLOTH 2 % EX PADS
6.0000 | MEDICATED_PAD | Freq: Once | CUTANEOUS | Status: AC
  Administered 2021-01-26: 6 via TOPICAL

## 2021-01-26 MED ORDER — POLYETHYLENE GLYCOL 3350 17 G PO PACK
17.0000 g | PACK | Freq: Every day | ORAL | Status: DC
  Administered 2021-01-26 – 2021-01-31 (×5): 17 g via ORAL
  Filled 2021-01-26 (×6): qty 1

## 2021-01-26 MED ORDER — ASPIRIN 300 MG RE SUPP
300.0000 mg | Freq: Every day | RECTAL | Status: DC
  Administered 2021-01-26 – 2021-01-27 (×2): 300 mg via RECTAL
  Filled 2021-01-26 (×2): qty 1

## 2021-01-26 MED ORDER — DEXTROSE-NACL 5-0.45 % IV SOLN: INTRAVENOUS | Status: DC

## 2021-01-26 MED ORDER — BISACODYL 10 MG RE SUPP
10.0000 mg | Freq: Once | RECTAL | Status: AC
  Administered 2021-01-26: 10 mg via RECTAL
  Filled 2021-01-26: qty 1

## 2021-01-26 MED ORDER — ATORVASTATIN CALCIUM 40 MG PO TABS
40.0000 mg | ORAL_TABLET | Freq: Every day | ORAL | Status: DC
  Administered 2021-01-27 – 2021-01-31 (×4): 40 mg via ORAL
  Filled 2021-01-26 (×5): qty 1

## 2021-01-26 MED ORDER — DEXAMETHASONE SODIUM PHOSPHATE 10 MG/ML IJ SOLN
10.0000 mg | Freq: Once | INTRAMUSCULAR | Status: AC
  Administered 2021-01-26: 10 mg via INTRAVENOUS
  Filled 2021-01-26: qty 1

## 2021-01-26 MED ORDER — SENNOSIDES-DOCUSATE SODIUM 8.6-50 MG PO TABS
2.0000 | ORAL_TABLET | Freq: Every day | ORAL | Status: DC
  Administered 2021-01-27 – 2021-01-30 (×4): 2 via ORAL
  Filled 2021-01-26 (×4): qty 2

## 2021-01-26 MED ORDER — ACETAMINOPHEN 500 MG PO TABS
1000.0000 mg | ORAL_TABLET | ORAL | Status: AC
  Administered 2021-01-26: 1000 mg via ORAL
  Filled 2021-01-26: qty 2

## 2021-01-26 MED ORDER — VANCOMYCIN HCL IN DEXTROSE 1-5 GM/200ML-% IV SOLN
1000.0000 mg | INTRAVENOUS | Status: DC
  Filled 2021-01-26: qty 200

## 2021-01-26 MED ORDER — GABAPENTIN 300 MG PO CAPS
300.0000 mg | ORAL_CAPSULE | ORAL | Status: AC
  Filled 2021-01-26: qty 1

## 2021-01-26 MED ORDER — HYDROCORTISONE NA SUCCINATE PF 100 MG IJ SOLR
50.0000 mg | Freq: Every day | INTRAMUSCULAR | Status: DC
  Administered 2021-01-26 – 2021-01-27 (×2): 50 mg via INTRAVENOUS
  Filled 2021-01-26 (×2): qty 2

## 2021-01-26 NOTE — Progress Notes (Signed)
Patient Demographics:    [name], is a 55 y.o. female, DOB - September 14, 1965, [id number]  Admit date - 01/25/2021   Admitting Physician Daishia Fetterly Denton Brick, MD  Outpatient Primary MD for the patient is Redmond School, MD  LOS - 0   Chief Complaint  Patient presents with  . Dizziness        Subjective:    [name] today has no fevers, no emesis,  No chest pain,   Sister Glenda at bedside -Patient remains lethargic  Assessment  & Plan :    Principal Problem:   Acute CVA/punctate acute or subacute infarction in the lower Lt Paramedian Mid Brain Active Problems:   Aphasia   Acute metabolic encephalopathy   Multiple falls   Hypothyroidism   Essential hypertension   QT prolongation   Addison's disease (HCC)   CAD (coronary artery disease)   GERD (gastroesophageal reflux disease)   Degeneration of lumbar intervertebral disc  Brief Summary: 55 year old reformed smoker with past medical history relevant for Addison's disease, hypothyroidism, HTN, CAD, GERD and chronic back pain on chronic opiates admitted on 01/25/2021 with recurrent falls and aphasia and found to have acute stroke  A/p  1)Acute CVA/punctate acute or subacute infarction in the lower Lt Paramedian Mid Brain -CT head and brain MRI noted -Neurology consult pending -Give rectal aspirin until oral intake is more reliable -Lipitor when able to take oral intake -PT/OT eval appreciated---recommends SNF -Allow permissive hypertension -Patient had MRA head and MRA neck on November 14, 2020 without LVO -Echo with preserved EF of 55 to 60% without wall motion abnormalities or significant valvular problems -Watch for arrhythmias on telemetry unit -A1c is 5.7,  -LDL is 54 with HDL of 50---Even if her lipid panel is within desired limits, patient should still take Lipitor/Statin for it's Pleiotropic effects (beyond cholesterol lowering  benefits)  2) aphasia/dysphagia--speech pathology eval requested  3) chronic back pain--- patient has had numerous injections and procedures and prior surgeries for neck and lower back pain currently on opiates chronically  -patient was scheduled for surgery on 02/04/2021 this will have to be postponed  4) Addison's disease--- IV hydrocortisone until oral intake is more reliable than we will switch back to oral hydrocortisone  5)GERD-Protonix as ordered  6)Hypothyroidism--- if unable to take oral intake over the next couple days may start IV levothyroxine  7) acute metabolic encephalopathy--UA and chest x-ray without evidence of infection -UDS with opiates, benzos and THC, patient is prescribed opiates and benzos  8)Social/Ethics--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 Barriers: Not Clinically Stable-   Code Status :  -  Code Status: Full Code   Family Communication:  discussed with patient's son Erlene Quan and patient's Sister Glenda, Consults  :  Neuro  DVT Prophylaxis  :   - SCDs  SCD's Start: 01/26/21 0126 enoxaparin (LOVENOX) injection 40 mg Start: 01/25/21 2100   Lab Results  Component Value Date   PLT 348 01/26/2021    Inpatient Medications  Scheduled Meds: . atorvastatin  20 mg Oral Daily  . enoxaparin (LOVENOX) injection  40 mg Subcutaneous Q24H  . gabapentin  300 mg Oral To OR  . gabapentin  600 mg Oral  QID  . hydrocortisone sod succinate (SOLU-CORTEF) inj  50 mg Intravenous Daily  . levothyroxine  75 mcg Oral QAC breakfast  . polyethylene glycol  17 g Oral Daily  . senna-docusate  2 tablet Oral QHS  . zolpidem  5 mg Oral QHS   Continuous Infusions: . dextrose 5 % and 0.45% NaCl 100 mL/hr at 01/26/21 1705  . vancomycin     PRN Meds:.acetaminophen **OR** acetaminophen (TYLENOL) oral liquid 160 mg/5 mL **OR** acetaminophen, meclizine, oxyCODONE    Anti-infectives (From admission, onward)   Start     Dose/Rate Route Frequency Ordered Stop   01/26/21 0600  vancomycin (VANCOCIN) IVPB 1000 mg/200 mL premix        1,000 mg 200 mL/hr over 60 Minutes Intravenous On call to O.R. 01/26/21 0125 01/27/21 0559        Objective:   Vitals:   01/26/21 0643 01/26/21 0831 01/26/21 1209 01/26/21 1654  BP: (!) 176/95 (!) 175/101 (!) 159/85 (!) 186/91  Pulse: 92 97 85 94  Resp: 17     Temp:   98.8 F (37.1 C) 97.6 F (36.4 C)  TempSrc:   Oral Oral  SpO2: 97% 97% 99% 97%  Weight:        Wt Readings from Last 3 Encounters:  01/25/21 81 kg  11/13/20 80.7 kg  10/21/20 80.7 kg     Intake/Output Summary (Last 24 hours) at 01/26/2021 1812 Last data filed at 01/26/2021 6073 Gross per 24 hour  Intake 0 ml  Output --  Net 0 ml   Physical Exam  Gen:- sleepy, in no apparent distress  HEENT:- [place].AT, No sclera icterus Neck-Supple Neck,No JVD,.  Lungs-  CTAB , fair symmetrical air movement CV- S1, S2 normal, regular  Abd-  +ve B.Sounds, Abd Soft, No tenderness,    Extremity/Skin:- No  edema, pedal pulses present  NeuroPsych-affect is flat, increased lethargy, neuro exam is limited, concerns about speech and swallowing difficulties   Data Review:   Micro Results Recent Results (from the past 240 hour(s))  Resp Panel by RT-PCR (Flu A&B, Covid) Nasopharyngeal Swab     Status: None   Collection Time: 01/25/21  3:40 PM   Specimen: Nasopharyngeal Swab; Nasopharyngeal(NP) swabs in vial  transport medium  Result Value Ref Range Status   SARS Coronavirus 2 by RT PCR NEGATIVE NEGATIVE Final    Comment: (NOTE) SARS-CoV-2 target nucleic acids are NOT DETECTED.  The SARS-CoV-2 RNA is generally detectable in upper respiratory specimens during the acute phase of infection. The lowest concentration of SARS-CoV-2 viral copies this assay can detect is 138 copies/mL. A negative result does not preclude SARS-Cov-2 infection and should not be used as the sole basis for treatment or other patient management decisions. A negative result may occur with  improper specimen collection/handling, submission of specimen other than nasopharyngeal  swab, presence of viral mutation(s) within the areas targeted by this assay, and inadequate number of viral copies(<138 copies/mL). A negative result must be combined with clinical observations, patient history, and epidemiological information. The expected result is Negative.  Fact Sheet for Patients:  EntrepreneurPulse.com.au  Fact Sheet for Healthcare Providers:  IncredibleEmployment.be  This test is no t yet approved or cleared by the [place] FDA and  has been authorized for detection and/or diagnosis of SARS-CoV-2 by FDA under an Emergency Use Authorization (EUA). This EUA will remain  in effect (meaning this test can be used) for the duration of the COVID-19 declaration under Section 564(b)(1) of the Act, 21 U.S.C.section 360bbb-3(b)(1), unless the authorization is terminated  or revoked sooner.       Influenza A by PCR NEGATIVE NEGATIVE Final   Influenza B by PCR NEGATIVE NEGATIVE Final    Comment: (NOTE) The Xpert Xpress SARS-CoV-2/FLU/RSV plus assay is intended as an aid in the diagnosis of influenza from Nasopharyngeal swab specimens and should not be used as a sole basis for treatment. Nasal washings and aspirates are unacceptable for Xpert Xpress SARS-CoV-2/FLU/RSV testing.  Fact  Sheet for Patients: EntrepreneurPulse.com.au  Fact Sheet for Healthcare Providers: IncredibleEmployment.be  This test is not yet approved or cleared by the [place] FDA and has been authorized for detection and/or diagnosis of SARS-CoV-2 by FDA under an Emergency Use Authorization (EUA). This EUA will remain in effect (meaning this test can be used) for the duration of the COVID-19 declaration under Section 564(b)(1) of the Act, 21 U.S.C. section 360bbb-3(b)(1), unless the authorization is terminated or revoked.  Performed at [facility], [address], [place], [place] [address]     Radiology Reports CT ABDOMEN PELVIS WO CONTRAST  Result Date: 01/25/2021 CLINICAL DATA:  Fall.  Abdominal trauma. EXAM: CT ABDOMEN AND PELVIS WITHOUT CONTRAST TECHNIQUE: Multidetector CT imaging of the abdomen and pelvis was performed following the standard protocol without IV contrast. COMPARISON:  10/24/2019 FINDINGS: Lower chest: No acute abnormality. Hepatobiliary: Prior cholecystectomy. No focal hepatic abnormality or evidence of perihepatic hematoma. Pancreas: No focal abnormality or ductal dilatation. Spleen: No splenic injury or perisplenic hematoma. Adrenals/Urinary Tract: No adrenal hemorrhage or renal injury identified. Bladder is unremarkable. Stomach/Bowel: Moderate stool burden throughout the colon. Stomach, large and small bowel grossly unremarkable. Normal appendix. Vascular/Lymphatic: Aortic atherosclerosis. No evidence of aneurysm or adenopathy. Reproductive: Prior hysterectomy.  No adnexal masses. Other: No free fluid or free air. Musculoskeletal: No acute bony abnormality. IMPRESSION: No acute findings in the abdomen or pelvis. Aortic atherosclerosis. Moderate stool burden. Electronically Signed   By: Rolm Baptise M.D.   On: 01/25/2021 14:46   Chest 2 View  Result Date: 01/26/2021 CLINICAL DATA:  Dizziness and weakness EXAM: CHEST - 2 VIEW COMPARISON:   01/10/2020 FINDINGS: The heart size and mediastinal contours are within normal limits. Both lungs are clear. The visualized skeletal structures are unremarkable. IMPRESSION: No active cardiopulmonary disease. Electronically Signed   By: Ulyses Jarred M.D.   On: 01/26/2021 02:31   CT HEAD WO CONTRAST  Result Date: 01/25/2021 CLINICAL DATA:  Neuro deficit. Acute stroke suspected. Dizziness and weakness. Ten falls yesterday. Pain. EXAM: CT HEAD WITHOUT CONTRAST CT CERVICAL SPINE WITHOUT CONTRAST TECHNIQUE: Multidetector CT imaging of the head and cervical spine was performed following the standard protocol without intravenous contrast. Multiplanar CT image reconstructions of the cervical spine were also generated. COMPARISON:  None. FINDINGS: CT HEAD FINDINGS Brain: No subdural, epidural, or subarachnoid hemorrhage. Cerebellum, brainstem, and  basal cisterns are normal. Ventricles and sulci are unremarkable. No mass effect or midline shift. No acute cortical ischemia or infarct. Vascular: No hyperdense vessel or unexpected calcification. Skull: Normal. Negative for fracture or focal lesion. Sinuses/Orbits: No acute finding. Other: None. CT CERVICAL SPINE FINDINGS Alignment: Evaluation is somewhat limited due to motion. Within the limitations of motion, no traumatic malalignment is identified. Skull base and vertebrae: No acute fracture. No primary bone lesion or focal pathologic process. Soft tissues and spinal canal: No prevertebral fluid or swelling. No visible canal hematoma. Disc levels: Patient is status post anterior plate screw fusion C5 through C7. Hardware is in good position. Mild degenerative disc disease with small anterior and posterior osteophytes. Upper chest: Negative. Other: No other abnormalities. IMPRESSION: 1. No acute intracranial abnormalities identified. 2. Evaluation of the cervical spine is mildly limited by motion. Within these limitations, no fracture or traumatic malalignment identified.  Electronically Signed   By: Dorise Bullion III M.D   On: 01/25/2021 14:56   CT Cervical Spine Wo Contrast  Result Date: 01/25/2021 CLINICAL DATA:  Neuro deficit. Acute stroke suspected. Dizziness and weakness. Ten falls yesterday. Pain. EXAM: CT HEAD WITHOUT CONTRAST CT CERVICAL SPINE WITHOUT CONTRAST TECHNIQUE: Multidetector CT imaging of the head and cervical spine was performed following the standard protocol without intravenous contrast. Multiplanar CT image reconstructions of the cervical spine were also generated. COMPARISON:  None. FINDINGS: CT HEAD FINDINGS Brain: No subdural, epidural, or subarachnoid hemorrhage. Cerebellum, brainstem, and basal cisterns are normal. Ventricles and sulci are unremarkable. No mass effect or midline shift. No acute cortical ischemia or infarct. Vascular: No hyperdense vessel or unexpected calcification. Skull: Normal. Negative for fracture or focal lesion. Sinuses/Orbits: No acute finding. Other: None. CT CERVICAL SPINE FINDINGS Alignment: Evaluation is somewhat limited due to motion. Within the limitations of motion, no traumatic malalignment is identified. Skull base and vertebrae: No acute fracture. No primary bone lesion or focal pathologic process. Soft tissues and spinal canal: No prevertebral fluid or swelling. No visible canal hematoma. Disc levels: Patient is status post anterior plate screw fusion C5 through C7. Hardware is in good position. Mild degenerative disc disease with small anterior and posterior osteophytes. Upper chest: Negative. Other: No other abnormalities. IMPRESSION: 1. No acute intracranial abnormalities identified. 2. Evaluation of the cervical spine is mildly limited by motion. Within these limitations, no fracture or traumatic malalignment identified. Electronically Signed   By: Dorise Bullion III M.D   On: 01/25/2021 14:56   MR BRAIN WO CONTRAST  Result Date: 01/26/2021 CLINICAL DATA:  Acute stroke presentation. Dizziness and weakness  with falling. EXAM: MRI HEAD WITHOUT CONTRAST TECHNIQUE: Multiplanar, multiecho pulse sequences of the brain and surrounding structures were obtained without intravenous contrast. COMPARISON:  Head CT yesterday.  MRI 11/14/2020 FINDINGS: Brain: Newly seen punctate acute or subacute infarction in the lower left paramedian mid brain. No other acute finding. The brainstem and cerebellum appear otherwise normal. Cerebral hemispheres show a few punctate foci of T2 and FLAIR signal within the deep and subcortical white matter, often seen at this age. No cortical or large vessel territory infarction. No mass lesion, hemorrhage, hydrocephalus or extra-axial collection. Vascular: Major vessels at the base of the brain show flow. Skull and upper cervical spine: Negative Sinuses/Orbits: Clear/normal Other: None IMPRESSION: Subtle evidence of a punctate acute infarction in the lower left paramedian mid brain. No finding was present in this location on the study of 11/14/2020. The remainder the brain is essentially normal for age, with a  few punctate foci of T2 and FLAIR signal within the cerebral hemispheric white matter as seen previously. Electronically Signed   By: Nelson Chimes M.D.   On: 01/26/2021 11:26   MM 3D SCREEN BREAST BILATERAL  Result Date: 01/12/2021 CLINICAL DATA:  Screening. EXAM: DIGITAL SCREENING BILATERAL MAMMOGRAM WITH TOMOSYNTHESIS AND CAD TECHNIQUE: Bilateral screening digital craniocaudal and mediolateral oblique mammograms were obtained. Bilateral screening digital breast tomosynthesis was performed. The images were evaluated with computer-aided detection. COMPARISON:  Previous exam(s). ACR Breast Density Category b: There are scattered areas of fibroglandular density. FINDINGS: There are no findings suspicious for malignancy. The images were evaluated with computer-aided detection. IMPRESSION: No mammographic evidence of malignancy. A result letter of this screening mammogram will be mailed directly  to the patient. RECOMMENDATION: Screening mammogram in one year. (Code:SM-B-01Y) BI-RADS CATEGORY  1: Negative. Electronically Signed   By: Nolon Nations M.D.   On: 01/12/2021 15:42   ECHOCARDIOGRAM LIMITED  Result Date: 01/26/2021    ECHOCARDIOGRAM LIMITED REPORT   Patient Name:   JOURDEN HOPKINS Date of Exam: 01/26/2021 Medical Rec #:  [id number]      Height:       65.5 in Accession #:    [id number]     Weight:       178.6 lb Date of Birth:  22-Aug-1966       BSA:          1.896 m Patient Age:    24 years       BP:           175/101 mmHg Patient Gender: F              HR:           90 bpm. Exam Location:  [facility] Procedure: Limited Echo Indications:    Stroke I63.9  History:        Patient has prior history of Echocardiogram examinations, most                 recent 10/08/2020. CAD, Signs/Symptoms:Chest Pain; Risk                 Factors:Former Smoker and Hypertension. GERD, Mitral Valve                 Prolapse.  Sonographer:    Leavy Cella RDCS (AE) Referring Phys: [id number] Michaeal Davis IMPRESSIONS  1. Left ventricular ejection fraction, by estimation, is 55 to 60%. The left ventricle has normal function. The left ventricle has no regional wall motion abnormalities. There is mild left ventricular hypertrophy.  2. Right ventricular systolic function is normal. The right ventricular size is normal.  3. The mitral valve is normal in structure. No evidence of mitral valve regurgitation.  4. Aortic valve regurgitation is not visualized. Mild aortic valve sclerosis is present, with no evidence of aortic valve stenosis.  5. The inferior vena cava is normal in size with greater than 50% respiratory variability, suggesting right atrial pressure of 3 mmHg. Comparison(s): The left ventricular function is unchanged. FINDINGS  Left Ventricle: Left ventricular ejection fraction, by estimation, is 55 to 60%. The left ventricle has normal function. The left ventricle has no regional wall motion abnormalities. The left  ventricular internal cavity size was normal in size. There is  mild left ventricular hypertrophy. Right Ventricle: The right ventricular size is normal. Right vetricular wall thickness was not assessed. Right ventricular systolic function is normal. Left Atrium: Left atrial size was normal in size. Right  Atrium: Right atrial size was normal in size. Pericardium: There is no evidence of pericardial effusion. Mitral Valve: The mitral valve is normal in structure. Tricuspid Valve: The tricuspid valve is normal in structure. Tricuspid valve regurgitation is not demonstrated. Aortic Valve: Aortic valve regurgitation is not visualized. Mild aortic valve sclerosis is present, with no evidence of aortic valve stenosis. Pulmonic Valve: The pulmonic valve was not well visualized. Aorta: The aortic root is normal in size and structure. Venous: The inferior vena cava is normal in size with greater than 50% respiratory variability, suggesting right atrial pressure of 3 mmHg. LEFT VENTRICLE PLAX 2D LVIDd:         4.01 cm  Diastology LVIDs:         2.88 cm  LV e' medial:    5.34 cm/s LV PW:         1.18 cm  LV E/e' medial:  11.1 LV IVS:        1.31 cm  LV e' lateral:   11.00 cm/s LVOT diam:     1.90 cm  LV E/e' lateral: 5.4 LVOT Area:     2.84 cm  LEFT ATRIUM           Index LA diam:      3.30 cm 1.74 cm/m LA Vol (A4C): 26.0 ml 13.71 ml/m   AORTA Ao Root diam: 2.80 cm MITRAL VALVE MV Area (PHT): 4.21 cm     SHUNTS MV Decel Time: 180 msec     Systemic Diam: 1.90 cm MV E velocity: 59.10 cm/s MV A velocity: 104.00 cm/s MV E/A ratio:  0.57 Dorris Carnes MD Electronically signed by Dorris Carnes MD Signature Date/Time: 01/26/2021/5:30:21 PM    Final      CBC Recent Labs  Lab 01/25/21 1451 01/25/21 1505 01/26/21 0409  WBC 11.5*  --  11.3*  HGB 17.2* 18.4* 16.6*  HCT 53.4* 54.0* 50.8*  PLT 342  --  348  MCV 90.4  --  89.9  MCH 29.1  --  29.4  MCHC 32.2  --  32.7  RDW 14.6  --  14.6  LYMPHSABS 2.4  --  2.5  MONOABS 1.2*  --   1.1*  EOSABS 0.2  --  0.1  BASOSABS 0.1  --  0.1    Chemistries  Recent Labs  Lab 01/25/21 1451 01/25/21 1505 01/26/21 0409  NA 141 145 140  K 3.5 3.6 3.7  CL 105 107 106  CO2 26  --  22  GLUCOSE 93 91 113*  BUN 16 17 20   CREATININE 1.09* 1.10* 1.15*  CALCIUM 9.7  --  9.3  AST 21  --   --   ALT 17  --   --   ALKPHOS 87  --   --   BILITOT 0.9  --   --    ------------------------------------------------------------------------------------------------------------------ Recent Labs    01/26/21 0410  CHOL 137  HDL 50  LDLCALC 54  TRIG 163*  CHOLHDL 2.7    Lab Results  Component Value Date   HGBA1C 5.7 (H) 01/26/2021   ------------------------------------------------------------------------------------------------------------------ No results for input(s): TSH, T4TOTAL, T3FREE, THYROIDAB in the last 72 hours.  Invalid input(s): FREET3 ------------------------------------------------------------------------------------------------------------------ No results for input(s): VITAMINB12, FOLATE, FERRITIN, TIBC, IRON, RETICCTPCT in the last 72 hours.  Coagulation profile Recent Labs  Lab 01/25/21 1451 01/26/21 0409  INR 1.0 1.1    No results for input(s): DDIMER in the last 72 hours.  Cardiac Enzymes No results for input(s): CKMB, TROPONINI, MYOGLOBIN  in the last 168 hours.  Invalid input(s): CK ------------------------------------------------------------------------------------------------------------------    Component Value Date/Time   BNP 59.0 08/05/2019 1958     Jos Cygan M.D on 01/26/2021 at 6:12 PM  Go to [email or web address] - for contact info  [facility] - Office  [phone]

## 2021-01-26 NOTE — NC FL2 (Signed)
[place] LEVEL OF CARE SCREENING TOOL     IDENTIFICATION  Patient Name: [name] Birthdate: December 07, 1965 Sex: female Admission Date (Current Location): 01/25/2021  [facility] and [place] Number:  [employer] and Address:  [facility] [address] [place]      Provider Number: [id number]  Attending Physician Name and Address:  Roxan Hockey, MD  Relative Name and Phone Number:       Current Level of Care: Hospital Recommended Level of Care: [place] Prior Approval Number:    Date Approved/Denied:   PASRR Number: pending  Discharge Plan: SNF    Current Diagnoses: Patient Active Problem List   Diagnosis Date Noted  . Aphasia 01/25/2021  . Multiple falls 01/25/2021  . Genetic testing 12/31/2019  . AKI (acute kidney injury) ([place]) 08/06/2019  . Emesis, persistent 08/06/2019  . Viral syndrome 08/05/2019  . Tobacco abuse   . History of colonic polyps 07/23/2019  . Family history of breast cancer   . Family history of colon cancer   . Synovial cyst of lumbar spine 12/20/2018  . Degeneration of lumbar intervertebral disc 12/01/2018  . Myositis 05/11/2016  . Anxiety 05/26/2015  . Tremors of nervous system 05/26/2015  . Neck pain 05/26/2015  . Hot flushes, perimenopausal 07/24/2013  . CAD (coronary artery disease) 04/11/2012  . GERD (gastroesophageal reflux disease) 04/11/2012  . Addison's disease ([place]) 03/03/2012  . QT prolongation 12/15/2010  . Palpitations 01/01/2009  . Hypothyroidism 12/31/2008  . Hyperlipidemia 12/31/2008  . OVERWEIGHT/OBESITY 12/31/2008  . Essential hypertension 12/31/2008    Orientation RESPIRATION BLADDER Height & Weight     Self  Normal Incontinent Weight: 178 lb 9.2 oz (81 kg) Height:     BEHAVIORAL SYMPTOMS/MOOD NEUROLOGICAL BOWEL NUTRITION STATUS      Incontinent Diet (heart healthy. See d/c summary for updates.)  AMBULATORY STATUS COMMUNICATION OF NEEDS Skin    Extensive Assist Verbally Normal                       Personal Care Assistance Level of Assistance  Bathing,Dressing,Feeding Bathing Assistance: Maximum assistance Feeding assistance: Limited assistance Dressing Assistance: Maximum assistance     Functional Limitations Info  Sight,Hearing,Speech Sight Info: Adequate Hearing Info: Adequate Speech Info: Adequate    SPECIAL CARE FACTORS FREQUENCY  PT (By licensed PT)     PT Frequency: 5x weekly              Contractures      Additional Factors Info  Code Status,Allergies,Psychotropic Code Status Info: Full code Allergies Info: Bee venom, Doxycycline, Erythromycin, Ibuprofen, Penicillins, Sulfa Antibiotics, Cat hair extract, Dust mite extract, Tramadol, Morphine and related Psychotropic Info: Xanax, Wellbutrin         Current Medications (01/26/2021):  This is the current hospital active medication list Current Facility-Administered Medications  Medication Dose Route Frequency Provider Last Rate Last Admin  . acetaminophen (TYLENOL) tablet 650 mg  650 mg Oral Q4H PRN Truett Mainland, DO   650 mg at 01/26/21 4540   Or  . acetaminophen (TYLENOL) 160 MG/5ML solution 650 mg  650 mg Per Tube Q4H PRN Truett Mainland, DO       Or  . acetaminophen (TYLENOL) suppository 650 mg  650 mg Rectal Q4H PRN Truett Mainland, DO      . atorvastatin (LIPITOR) tablet 20 mg  20 mg Oral Daily Truett Mainland, DO   20 mg at 01/26/21  0825  . bisacodyl (DULCOLAX) suppository 10 mg  10 mg Rectal Once Emokpae, Courage, MD      . dextrose 5 %-0.45 % sodium chloride infusion   Intravenous Continuous Emokpae, Courage, MD      . enoxaparin (LOVENOX) injection 40 mg  40 mg Subcutaneous Q24H Truett Mainland, DO   40 mg at 01/25/21 2124  . gabapentin (NEURONTIN) capsule 300 mg  300 mg Oral To OR Eustace Moore, MD      . gabapentin (NEURONTIN) capsule 600 mg  600 mg Oral QID Truett Mainland, DO   600 mg at 01/26/21 1431  . hydrocortisone  sodium succinate (SOLU-CORTEF) 100 MG injection 50 mg  50 mg Intravenous Daily Emokpae, Courage, MD      . levothyroxine (SYNTHROID) tablet 75 mcg  75 mcg Oral QAC breakfast Truett Mainland, DO   75 mcg at 01/26/21 0556  . meclizine (ANTIVERT) tablet 25 mg  25 mg Oral TID PRN Truett Mainland, DO      . oxyCODONE (Oxy IR/ROXICODONE) immediate release tablet 15 mg  15 mg Oral Q4H PRN Truett Mainland, DO      . polyethylene glycol (MIRALAX / GLYCOLAX) packet 17 g  17 g Oral Daily Emokpae, Courage, MD      . senna-docusate (Senokot-S) tablet 2 tablet  2 tablet Oral QHS Emokpae, Courage, MD      . vancomycin (VANCOCIN) IVPB 1000 mg/200 mL premix  1,000 mg Intravenous On Call to OR Eustace Moore, MD      . zolpidem Lorrin Mais) tablet 5 mg  5 mg Oral QHS Truett Mainland, DO   5 mg at 01/25/21 2124     Discharge Medications: Please see discharge summary for a list of discharge medications.  Relevant Imaging Results:  Relevant Lab Results:   Additional Information SSN: [id number].  Salome Arnt, LCSW

## 2021-01-26 NOTE — Plan of Care (Signed)
  Problem: Acute Rehab PT Goals(only PT should resolve) Goal: Pt Will Go Supine/Side To Sit Outcome: Progressing Flowsheets (Taken 01/26/2021 1409) Pt will go Supine/Side to Sit:  with minimal assist  with moderate assist   Problem: Acute Rehab PT Goals(only PT should resolve) Goal: Patient Will Perform Sitting Balance Outcome: Progressing Flowsheets (Taken 01/26/2021 1409) Patient will perform sitting balance:  with min guard assist  with minimal assist   Problem: Acute Rehab PT Goals(only PT should resolve) Goal: Patient Will Transfer Sit To/From Stand Outcome: Progressing Flowsheets (Taken 01/26/2021 1409) Patient will transfer sit to/from stand: with moderate assist   Problem: Acute Rehab PT Goals(only PT should resolve) Goal: Pt Will Transfer Bed To Chair/Chair To Bed Outcome: Progressing Flowsheets (Taken 01/26/2021 1409) Pt will Transfer Bed to Chair/Chair to Bed: with mod assist   Problem: Acute Rehab PT Goals(only PT should resolve) Goal: Pt Will Ambulate Outcome: Progressing Flowsheets (Taken 01/26/2021 1409) Pt will Ambulate:  15 feet  with moderate assist  with rolling walker   2:12 PM, 01/26/21 Lonell Grandchild, MPT Physical Therapist with [facility] [phone] office [phone] mobile phone

## 2021-01-26 NOTE — Progress Notes (Signed)
*  PRELIMINARY RESULTS* Echocardiogram 2D Echocardiogram has been performed.  Leavy Cella 01/26/2021, 11:58 AM

## 2021-01-26 NOTE — Progress Notes (Signed)
  DOB: Aug 28, 1966 Date: 01/26/21  Must ID: [id number]   To Whom it May Concern:  Please be advised that the above named patient will require a short-term nursing home stay- anticipated 30 days or less rehabilitation and strengthening. The plan is for return home.

## 2021-01-26 NOTE — Progress Notes (Signed)
SLP Cancellation Note  Patient Details Name: [name] MRN: [id number] DOB: 1965/10/07   Cancelled treatment:       Reason Eval/Treat Not Completed: Other (comment);Fatigue/lethargy limiting ability to participate; Will complete SLE tomorrow.  Thank you,  Genene Churn, [place]    [place] 01/26/2021, 2:56 PM

## 2021-01-26 NOTE — Consult Note (Signed)
Newcomb A. Merlene Laughter, MD     [email or web address]          [name] is an 55 y.o. female.   ASSESSMENT/PLAN: 1.  Recurrent Vertiginous symptoms of unclear etiology. MRI shows questionable left midbrain lacunar infarct. This is somewhat unimpressive.  The son seems to indicate that the vertiginous symptoms is recurrent and the lacune infarct if indeed present would not explain the recurrent ongoing vertiginous symptoms. She does have multiple medical comorbidities including coronary artery disease, Addison's insufficiency, congestive heart failure and COPD which could contribute or cause the vertiginous symptoms.  If indeed the patient does have a lacunar infarct, aspirin 81 mg should suffice. 2. Marked stupor/encephalopathy in the hospital with is undoubtedly due to medication effect. Minimize psychotropic medications.    This is the 55 year old white female who apparently has had recurrent episodes of vertiginous symptoms. She presented with another episode of vertiginous symptoms over last 2 days. She may have had a fall or 2 with this episode. It appears that during the course of the patient's hospitalization over last 24 hours, she was given benzodiazepine and Ambien. The son reports that she has significant psychosocial stress as she is the sole primary caregiver for her daughter who is a quadriplegic. She does have nursing help that comes around. The son reports that she sometimes goes all night to take care of her daughter and this may explain some of the hypersomnia although he tells me she is clear more stuporous then she typically is after staying up all night. There are no reports of focal weakness, numbness or convulsions. The review systems very limited given the severe encephalopathy at this time.     GENERAL: The patient is quite stuporous and unresponsive a most part.  HEENT:   Neck is supple no trauma noted.  ABDOMEN: soft  EXTREMITIES: No edema    BACK: Normal  SKIN: Normal by inspection.    MENTAL STATUS:  She lays in bed with eyes closed. She opens her eyes minimally to deep painful stimuli and follow midline commands. She requires continuous stimulation and even then does not stay awake and follow commands for the most part.  CRANIAL NERVES: Pupils are equal, round and reactive to light; extra ocular movements are full, there is no significant nystagmus; visual fields unable to be tested given the severe stupor; upper and lower facial muscles are normal in strength and symmetric, there is no flattening of the nasolabial folds; tongue is midline  MOTOR:  2 to 3+ movement to deep painful stimuli in the upper extremities.  COORDINATION:  No tremors, myoclonus or abnormal movements or parkinsonism noted.  REFLEXES: Deep tendon reflexes are symmetrical and normal.   SENSATION: Normal pain.     Blood pressure (!) 186/91, pulse 94, temperature 97.6 F (36.4 C), temperature source Oral, resp. rate 17, weight 81 kg, SpO2 97 %.  Past Medical History:  Diagnosis Date  . Addison's disease ([place])   . Addison's disease ([place])   . Adrenal insufficiency ([place])    diagnosed 2012  . Aneurysm ([place])   . Anxiety   . Arthritis   . Astigmatism   . CAD (coronary artery disease)    Cath 2008 EF normal. RCA 50-60, Septal 50%. Myoview 3/12: EF 53% normal perfusion  . Cardiac arrest ([place])    2/2 adissonian crisis  . Cardiomyopathy    resolved  . Chest pain    chronicc  . CHF (congestive heart failure) ([place])   .  Chronic back pain   . Chronic diarrhea   . Concussion    sept 28th 2014  . Family history of breast cancer   . Family history of colon cancer   . Gastroparesis   . GERD (gastroesophageal reflux disease)   . HTN (hypertension)   . Hyperlipidemia   . Hypothyroidism   . Mitral valve prolapse   . Nondiabetic gastroparesis   . PONV (postoperative nausea and vomiting)   . QT prolongation   . Tobacco abuse    down to 2  cigarettes per day  . Vertigo     Past Surgical History:  Procedure Laterality Date  . ABDOMINAL HYSTERECTOMY    . CARDIAC CATHETERIZATION  03/2007   showed 60% lesion in the right coronary artery  . CHOLECYSTECTOMY    . LEFT HEART CATHETERIZATION WITH CORONARY ANGIOGRAM N/A 04/13/2012   Procedure: LEFT HEART CATHETERIZATION WITH CORONARY ANGIOGRAM;  Surgeon: Hillary Bow, MD;  Location: [facility] CATH LAB;  Service: Cardiovascular;  Laterality: N/A;  . LUMBAR LAMINECTOMY/DECOMPRESSION MICRODISCECTOMY Left 12/20/2018   Procedure: Left Lumbar Three-Four Lumbar Laminotomy and Foraminotomy, Lumbar Four-Five Laminotomy and Foraminotomy with Microdiscectomy and Resection of Synovial Cyst;  Surgeon: Jovita Gamma, MD;  Location: [place];  Service: Neurosurgery;  Laterality: Left;  Left Lumbar 3-4 Lumbar laminotomy, foraminotomy, possible microdiscectomy with possible resection of synovial cyst  . SPINE SURGERY    . VARICOSE VEIN SURGERY    . VESICOVAGINAL FISTULA CLOSURE W/ TAH      Family History  Problem Relation Age of Onset  . Cancer Father        Colon  . Coronary artery disease Other   . Heart attack Mother        d. 57  . Breast cancer Paternal Uncle 77  . Dementia Maternal Grandmother   . Leukemia Maternal Uncle 1    Social History:  reports that she quit smoking yesterday. Her smoking use included cigarettes. She has a 2.50 pack-year smoking history. She has never used smokeless tobacco. She reports current drug use. Drugs: Marijuana and Oxycodone. She reports that she does not drink alcohol.  Allergies:  Allergies  Allergen Reactions  . Bee Venom Anaphylaxis  . Doxycycline Other (See Comments) and Nausea Only    Due to Pre-Existing conditions involved with stomach, patient does not take the following medication  . Erythromycin Other (See Comments) and Nausea And Vomiting    Due to Pre-Existing conditions involved with stomach, patient does not take the following medication  .  Ibuprofen Nausea Only    gastroparesis   . Penicillins Anaphylaxis and Swelling    Has patient had a PCN reaction causing immediate rash, facial/tongue/throat swelling, SOB or lightheadedness with hypotension: Yes Has patient had a PCN reaction causing severe rash involving mucus membranes or skin necrosis: Yes Has patient had a PCN reaction that required hospitalization No Has patient had a PCN reaction occurring within the last 10 years: No If all of the above answers are "NO", then may proceed with Cephalosporin use.   . Sulfa Antibiotics Hives and Itching  . Cat Hair Extract Hives, Itching and Swelling    SWELLING REACTION UNSPECIFIED   . Dust Mite Extract Hives and Swelling  . Tramadol Diarrhea, Nausea And Vomiting and Nausea Only  . Morphine And Related Itching    Medications: Prior to Admission medications   Medication Sig Start Date End Date Taking? Authorizing Provider  albuterol (VENTOLIN HFA) 108 (90 Base) MCG/ACT inhaler Inhale 1-2 puffs into the  lungs every 6 (six) hours as needed for wheezing or shortness of breath.  10/22/19  Yes [provider]  alendronate (FOSAMAX) 70 MG tablet Take 70 mg by mouth once a week. 09/15/20  Yes [provider]  ALPRAZolam Duanne Moron) 0.5 MG tablet Take 0.5 mg by mouth 3 (three) times daily. 10/09/20  Yes [provider]  amLODipine (NORVASC) 5 MG tablet Take 1 tablet (5 mg total) by mouth daily. Patient taking differently: Take 10 mg by mouth daily. 01/14/21  Yes Hilty, Nadean Corwin, MD  aspirin 81 MG chewable tablet Chew 81 mg by mouth daily.    Yes [provider]  atorvastatin (LIPITOR) 20 MG tablet Take 20 mg by mouth daily. 10/08/20  Yes [provider]  BACLOFEN PO Take 1 tablet by mouth 3 (three) times daily.   Yes [provider]  buPROPion (WELLBUTRIN SR) 150 MG 12 hr tablet Take 150 mg by mouth 2 (two) times daily.   Yes [provider]  cholecalciferol (VITAMIN D) 1000 UNITS  tablet Take 1,000 Units by mouth daily.   Yes [provider]  gabapentin (NEURONTIN) 600 MG tablet Take 600 mg by mouth 4 (four) times daily. 07/25/19  Yes [provider]  hydrocortisone (CORTEF) 10 MG tablet Take 10-20 mg by mouth See admin instructions. Takes 20 mg in the morning and 10 mg at night 04/07/16  Yes [provider]  levothyroxine (SYNTHROID, LEVOTHROID) 75 MCG tablet Take 75 mcg by mouth daily before breakfast.   Yes [provider]  meclizine (ANTIVERT) 25 MG tablet 1 or 2 tabs PO q8h prn dizziness Patient taking differently: Take 25-50 mg by mouth 3 (three) times daily as needed (for vertigo). 04/16/16  Yes Francine Graven, DO  nitrofurantoin, macrocrystal-monohydrate, (MACROBID) 100 MG capsule Take 100 mg by mouth 2 (two) times daily. 01/22/21  Yes [provider]  nitroGLYCERIN (NITROSTAT) 0.4 MG SL tablet Place 1 tablet (0.4 mg total) under the tongue every 5 (five) minutes as needed for chest pain. 09/17/20  Yes Hilty, Nadean Corwin, MD  ondansetron (ZOFRAN ODT) 4 MG disintegrating tablet Take 1 tablet (4 mg total) by mouth every 8 (eight) hours as needed for nausea. 08/07/19  Yes Emokpae, Courage, MD  oxyCODONE (ROXICODONE) 15 MG immediate release tablet Take 15 mg by mouth every 4 (four) hours as needed for pain.  07/27/19  Yes [provider]  tiZANidine (ZANAFLEX) 4 MG tablet Take 4 mg by mouth 4 (four) times daily as needed. 10/14/20  Yes [provider]  zolpidem (AMBIEN) 10 MG tablet Take 10 mg by mouth at bedtime. 05/06/16  Yes [provider]  furosemide (LASIX) 20 MG tablet TAKE 1 TABLET BY MOUTH EVERY DAY Patient not taking: No sig reported 12/09/20   Pixie Casino, MD    Scheduled Meds: . atorvastatin  20 mg Oral Daily  . enoxaparin (LOVENOX) injection  40 mg Subcutaneous Q24H  . gabapentin  300 mg Oral To OR  . gabapentin  600 mg Oral QID  . hydrocortisone sod succinate (SOLU-CORTEF) inj  50 mg  Intravenous Daily  . levothyroxine  75 mcg Oral QAC breakfast  . polyethylene glycol  17 g Oral Daily  . senna-docusate  2 tablet Oral QHS  . zolpidem  5 mg Oral QHS   Continuous Infusions: . dextrose 5 % and 0.45% NaCl 100 mL/hr at 01/26/21 1705  . vancomycin     PRN Meds:.acetaminophen **OR** acetaminophen (TYLENOL) oral liquid 160 mg/5 mL **OR**  acetaminophen, meclizine, oxyCODONE     Results for orders placed or performed during the hospital encounter of 01/25/21 (from the past 48 hour(s))  Ethanol     Status: None   Collection Time: 01/25/21  2:51 PM  Result Value Ref Range   Alcohol, Ethyl (B) <10 <10 mg/dL    Comment: (NOTE) Lowest detectable limit for serum alcohol is 10 mg/dL.  For medical purposes only. Performed at [facility], [address] [place], [place] [address]   Protime-INR     Status: None   Collection Time: 01/25/21  2:51 PM  Result Value Ref Range   Prothrombin Time 13.1 11.4 - 15.2 seconds   INR 1.0 0.8 - 1.2    Comment: (NOTE) INR goal varies based on device and disease states. Performed at [facility], [address] [place], [place] [address]   APTT     Status: None   Collection Time: 01/25/21  2:51 PM  Result Value Ref Range   aPTT 33 24 - 36 seconds    Comment: Performed at [facility], [address], [place], [place] [address]  CBC     Status: Abnormal   Collection Time: 01/25/21  2:51 PM  Result Value Ref Range   WBC 11.5 (H) 4.0 - 10.5 K/uL   RBC 5.91 (H) 3.87 - 5.11 MIL/uL   Hemoglobin 17.2 (H) 12.0 - 15.0 g/dL   HCT 53.4 (H) 36.0 - 46.0 %   MCV 90.4 80.0 - 100.0 fL   MCH 29.1 26.0 - 34.0 pg   MCHC 32.2 30.0 - 36.0 g/dL   RDW 14.6 11.5 - 15.5 %   Platelets 342 150 - 400 K/uL   nRBC 0.0 0.0 - 0.2 %    Comment: Performed at [facility], [address], [place], [place] [address]  Differential     Status: Abnormal   Collection Time: 01/25/21  2:51 PM  Result Value Ref Range   Neutrophils Relative % 65 %   Neutro Abs 7.6 1.7 -  7.7 K/uL   Lymphocytes Relative 21 %   Lymphs Abs 2.4 0.7 - 4.0 K/uL   Monocytes Relative 11 %   Monocytes Absolute 1.2 (H) 0.1 - 1.0 K/uL   Eosinophils Relative 2 %   Eosinophils Absolute 0.2 0.0 - 0.5 K/uL   Basophils Relative 1 %   Basophils Absolute 0.1 0.0 - 0.1 K/uL   Immature Granulocytes 0 %   Abs Immature Granulocytes 0.04 0.00 - 0.07 K/uL    Comment: Performed at [facility], [address] [place],  [address]  Comprehensive metabolic panel     Status: Abnormal   Collection Time: 01/25/21  2:51 PM  Result Value Ref Range   Sodium 141 135 - 145 mmol/L   Potassium 3.5 3.5 - 5.1 mmol/L   Chloride 105 98 - 111 mmol/L   CO2 26 22 - 32 mmol/L   Glucose, Bld 93 70 - 99 mg/dL    Comment: Glucose reference range applies only to samples taken after fasting for at least 8 hours.   BUN 16 6 - 20 mg/dL   Creatinine, Ser 1.09 (H) 0.44 - 1.00 mg/dL   Calcium 9.7 8.9 - 10.3 mg/dL   Total Protein 8.4 (H) 6.5 - 8.1 g/dL   Albumin 4.6 3.5 - 5.0 g/dL   AST 21 15 - 41 U/L   ALT 17 0 - 44 U/L   Alkaline Phosphatase 87 38 - 126 U/L   Total Bilirubin 0.9 0.3 -  1.2 mg/dL   GFR, Estimated 60 (L) >60 mL/min    Comment: (NOTE) Calculated using the CKD-EPI Creatinine Equation (2021)    Anion gap 10 5 - 15    Comment: Performed at [facility], [address], [place], [place] [address]  I-stat chem 8, ED     Status: Abnormal   Collection Time: 01/25/21  3:05 PM  Result Value Ref Range   Sodium 145 135 - 145 mmol/L   Potassium 3.6 3.5 - 5.1 mmol/L   Chloride 107 98 - 111 mmol/L   BUN 17 6 - 20 mg/dL   Creatinine, Ser 1.10 (H) 0.44 - 1.00 mg/dL   Glucose, Bld 91 70 - 99 mg/dL    Comment: Glucose reference range applies only to samples taken after fasting for at least 8 hours.   Calcium, Ion 1.14 (L) 1.15 - 1.40 mmol/L   TCO2 24 22 - 32 mmol/L   Hemoglobin 18.4 (H) 12.0 - 15.0 g/dL   HCT 54.0 (H) 36.0 - 46.0 %  Resp Panel by RT-PCR (Flu A&B, Covid) Nasopharyngeal Swab     Status:  None   Collection Time: 01/25/21  3:40 PM   Specimen: Nasopharyngeal Swab; Nasopharyngeal(NP) swabs in vial transport medium  Result Value Ref Range   SARS Coronavirus 2 by RT PCR NEGATIVE NEGATIVE    Comment: (NOTE) SARS-CoV-2 target nucleic acids are NOT DETECTED.  The SARS-CoV-2 RNA is generally detectable in upper respiratory specimens during the acute phase of infection. The lowest concentration of SARS-CoV-2 viral copies this assay can detect is 138 copies/mL. A negative result does not preclude SARS-Cov-2 infection and should not be used as the sole basis for treatment or other patient management decisions. A negative result may occur with  improper specimen collection/handling, submission of specimen other than nasopharyngeal swab, presence of viral mutation(s) within the areas targeted by this assay, and inadequate number of viral copies(<138 copies/mL). A negative result must be combined with clinical observations, patient history, and epidemiological information. The expected result is Negative.  Fact Sheet for Patients:  EntrepreneurPulse.com.au  Fact Sheet for Healthcare Providers:  IncredibleEmployment.be  This test is no t yet approved or cleared by the [place] FDA and  has been authorized for detection and/or diagnosis of SARS-CoV-2 by FDA under an Emergency Use Authorization (EUA). This EUA will remain  in effect (meaning this test can be used) for the duration of the COVID-19 declaration under Section 564(b)(1) of the Act, 21 U.S.C.section 360bbb-3(b)(1), unless the authorization is terminated  or revoked sooner.       Influenza A by PCR NEGATIVE NEGATIVE   Influenza B by PCR NEGATIVE NEGATIVE    Comment: (NOTE) The Xpert Xpress SARS-CoV-2/FLU/RSV plus assay is intended as an aid in the diagnosis of influenza from Nasopharyngeal swab specimens and should not be used as a sole basis for treatment. Nasal washings  and aspirates are unacceptable for Xpert Xpress SARS-CoV-2/FLU/RSV testing.  Fact Sheet for Patients: EntrepreneurPulse.com.au  Fact Sheet for Healthcare Providers: IncredibleEmployment.be  This test is not yet approved or cleared by the [place] FDA and has been authorized for detection and/or diagnosis of SARS-CoV-2 by FDA under an Emergency Use Authorization (EUA). This EUA will remain in effect (meaning this test can be used) for the duration of the COVID-19 declaration under Section 564(b)(1) of the Act, 21 U.S.C. section 360bbb-3(b)(1), unless the authorization is terminated or revoked.  Performed at [facility], [address], [place], [place] [address]   Urine rapid  drug screen (hosp performed)     Status: Abnormal   Collection Time: 01/25/21  4:53 PM  Result Value Ref Range   Opiates POSITIVE (A) NONE DETECTED   Cocaine NONE DETECTED NONE DETECTED   Benzodiazepines POSITIVE (A) NONE DETECTED   Amphetamines NONE DETECTED NONE DETECTED   Tetrahydrocannabinol POSITIVE (A) NONE DETECTED   Barbiturates NONE DETECTED NONE DETECTED    Comment: (NOTE) DRUG SCREEN FOR MEDICAL PURPOSES ONLY.  IF CONFIRMATION IS NEEDED FOR ANY PURPOSE, NOTIFY LAB WITHIN 5 DAYS.  LOWEST DETECTABLE LIMITS FOR URINE DRUG SCREEN Drug Class                     Cutoff (ng/mL) Amphetamine and metabolites    1000 Barbiturate and metabolites    200 Benzodiazepine                 [id number] Tricyclics and metabolites     300 Opiates and metabolites        300 Cocaine and metabolites        300 THC                            50 Performed at [facility], [address], [place], [place] [address]   Urinalysis, Routine w reflex microscopic     Status: Abnormal   Collection Time: 01/25/21  4:53 PM  Result Value Ref Range   Color, Urine YELLOW YELLOW   APPearance CLEAR CLEAR   Specific Gravity, Urine 1.017 1.005 - 1.030   pH 6.0 5.0 - 8.0   Glucose, UA NEGATIVE  NEGATIVE mg/dL   Hgb urine dipstick SMALL (A) NEGATIVE   Bilirubin Urine NEGATIVE NEGATIVE   Ketones, ur 20 (A) NEGATIVE mg/dL   Protein, ur 30 (A) NEGATIVE mg/dL   Nitrite NEGATIVE NEGATIVE   Leukocytes,Ua NEGATIVE NEGATIVE   RBC / HPF 0-5 0 - 5 RBC/hpf   WBC, UA 0-5 0 - 5 WBC/hpf   Bacteria, UA NONE SEEN NONE SEEN   Squamous Epithelial / LPF 0-5 0 - 5   Mucus PRESENT     Comment: Performed at [facility], [address], [place], [place] [address]  POC urine preg, ED     Status: None   Collection Time: 01/25/21  4:54 PM  Result Value Ref Range   Preg Test, Ur NEGATIVE NEGATIVE    Comment:        THE SENSITIVITY OF THIS METHODOLOGY IS >24 mIU/mL   CBC WITH DIFFERENTIAL     Status: Abnormal   Collection Time: 01/26/21  4:09 AM  Result Value Ref Range   WBC 11.3 (H) 4.0 - 10.5 K/uL   RBC 5.65 (H) 3.87 - 5.11 MIL/uL   Hemoglobin 16.6 (H) 12.0 - 15.0 g/dL   HCT 50.8 (H) 36.0 - 46.0 %   MCV 89.9 80.0 - 100.0 fL   MCH 29.4 26.0 - 34.0 pg   MCHC 32.7 30.0 - 36.0 g/dL   RDW 14.6 11.5 - 15.5 %   Platelets 348 150 - 400 K/uL   nRBC 0.0 0.0 - 0.2 %   Neutrophils Relative % 67 %   Neutro Abs 7.5 1.7 - 7.7 K/uL   Lymphocytes Relative 22 %   Lymphs Abs 2.5 0.7 - 4.0 K/uL   Monocytes Relative 9 %   Monocytes Absolute 1.1 (H) 0.1 - 1.0 K/uL   Eosinophils Relative 1 %   Eosinophils Absolute 0.1 0.0 - 0.5  K/uL   Basophils Relative 1 %   Basophils Absolute 0.1 0.0 - 0.1 K/uL   Immature Granulocytes 0 %   Abs Immature Granulocytes 0.05 0.00 - 0.07 K/uL    Comment: Performed at [facility], [address], [place], [place] [id number]  Basic metabolic panel     Status: Abnormal   Collection Time: 01/26/21  4:09 AM  Result Value Ref Range   Sodium 140 135 - 145 mmol/L   Potassium 3.7 3.5 - 5.1 mmol/L   Chloride 106 98 - 111 mmol/L   CO2 22 22 - 32 mmol/L   Glucose, Bld 113 (H) 70 - 99 mg/dL    Comment: Glucose reference range applies only to samples taken after fasting for at least 8  hours.   BUN 20 6 - 20 mg/dL   Creatinine, Ser 1.15 (H) 0.44 - 1.00 mg/dL   Calcium 9.3 8.9 - 10.3 mg/dL   GFR, Estimated 56 (L) >60 mL/min    Comment: (NOTE) Calculated using the CKD-EPI Creatinine Equation (2021)    Anion gap 12 5 - 15    Comment: Performed at [facility], [address], [place], [place] [address]  Protime-INR     Status: None   Collection Time: 01/26/21  4:09 AM  Result Value Ref Range   Prothrombin Time 14.1 11.4 - 15.2 seconds   INR 1.1 0.8 - 1.2    Comment: (NOTE) INR goal varies based on device and disease states. Performed at [facility], [address] [place], [place] [address]   Hemoglobin A1c     Status: Abnormal   Collection Time: 01/26/21  4:10 AM  Result Value Ref Range   Hgb A1c MFr Bld 5.7 (H) 4.8 - 5.6 %    Comment: (NOTE) Pre diabetes:          5.7%-6.4%  Diabetes:              >6.4%  Glycemic control for   <7.0% adults with diabetes    Mean Plasma Glucose 116.89 mg/dL    Comment: Performed at [facility] [address] [place], [place] [address]  Lipid panel     Status: Abnormal   Collection Time: 01/26/21  4:10 AM  Result Value Ref Range   Cholesterol 137 0 - 200 mg/dL   Triglycerides 163 (H) <150 mg/dL   HDL 50 >40 mg/dL   Total CHOL/HDL Ratio 2.7 RATIO   VLDL 33 0 - 40 mg/dL   LDL Cholesterol 54 0 - 99 mg/dL    Comment:        Total Cholesterol/HDL:CHD Risk Coronary Heart Disease Risk Table                     Men   Women  1/2 Average Risk   3.4   3.3  Average Risk       5.0   4.4  2 X Average Risk   9.6   7.1  3 X Average Risk  23.4   11.0        Use the calculated Patient Ratio above and the CHD Risk Table to determine the patient's CHD Risk.        ATP III CLASSIFICATION (LDL):  <100     mg/dL   Optimal  100-129  mg/dL   Near or Above                    Optimal  130-159  mg/dL  Borderline  160-189  mg/dL   High  >190     mg/dL   Very High Performed at [facility], [address] [place], [place]  [address]   HIV Antibody (routine testing w rflx)     Status: None   Collection Time: 01/26/21  4:10 AM  Result Value Ref Range   HIV Screen 4th Generation wRfx Non Reactive Non Reactive    Comment: Performed at [facility], [place] [address], [place], [place] [address]  Glucose, capillary     Status: Abnormal   Collection Time: 01/26/21  5:50 PM  Result Value Ref Range   Glucose-Capillary 147 (H) 70 - 99 mg/dL    Comment: Glucose reference range applies only to samples taken after fasting for at least 8 hours.    Studies/Results:   BRAIN MRI WO  01-2021 FINDINGS: Brain: Newly seen punctate acute or subacute infarction in the lower left paramedian mid brain. No other acute finding. The brainstem and cerebellum appear otherwise normal. Cerebral hemispheres show a few punctate foci of T2 and FLAIR signal within the deep and subcortical white matter, often seen at this age. No cortical or large vessel territory infarction. No mass lesion, hemorrhage, hydrocephalus or extra-axial collection.  Vascular: Major vessels at the base of the brain show flow.  Skull and upper cervical spine: Negative  Sinuses/Orbits: Clear/normal  Other: None  IMPRESSION: Subtle evidence of a punctate acute infarction in the lower left paramedian mid brain. No finding was present in this location on the study of 11/14/2020. The remainder the brain is essentially normal for age, with a few punctate foci of T2 and FLAIR signal within the cerebral hemispheric white matter as seen previously.      CT C SPINE HEAD  IMPRESSION: 1. No acute intracranial abnormalities identified. 2. Evaluation of the cervical spine is mildly limited by motion. Within these limitations, no fracture or traumatic malalignment Identified.    BRAIN MRI/MRA NECK MRA 11-2020 IMPRESSION: 1. No acute intracranial abnormality. 2. Minimal cerebral white matter T2 signal changes, slightly progressed from 2017 and  nonspecific with considerations including chronic small vessel ischemia, migraines, and prior infection/inflammation. No specific findings to suggest demyelinating disease. 3. Negative head MRA. 4. Negative neck MRA.   TTE 01-2021 1. Left ventricular ejection fraction, by estimation, is 55 to 60%. The left ventricle has normal function. The left ventricle has no regional wall motion abnormalities. There is mild left ventricular hypertrophy. 2. Right ventricular systolic function is normal. The right ventricular size is normal. 3. The mitral valve is normal in structure. No evidence of mitral valve regurgitation. 4. Aortic valve regurgitation is not visualized. Mild aortic valve sclerosis is present, with no evidence of aortic valve stenosis. 5. The inferior vena cava is normal in size with greater than 50% respiratory variability, suggesting right atrial pressure of 3 mmHg. Comparison(s): The left ventricular function is unchanged.   TTE 10-2020 1. Left ventricular ejection fraction, by estimation, is 55 to 60%. The  left ventricle has normal function. The left ventricle has no regional  wall motion abnormalities. Left ventricular diastolic parameters are  consistent with Grade I diastolic  dysfunction (impaired relaxation).  2. Right ventricular systolic function is normal. The right ventricular  size is normal. There is normal pulmonary artery systolic pressure. The  estimated right ventricular systolic pressure is 88.4 mmHg.  3. Left atrial size was mildly dilated.  4. The mitral valve is normal in structure. Trivial mitral valve  regurgitation. No  evidence of mitral stenosis.  5. The aortic valve is tricuspid. Aortic valve regurgitation is not  visualized. Mild aortic valve sclerosis is present, with no evidence of  aortic valve stenosis.  6. The inferior vena cava is normal in size with greater than 50%  respiratory variability, suggesting right atrial pressure of 3  mmHg.    The brain MRI is reviewed in person. There is questionable of faint increased still involving the left midbrain of questionable significance. There is minimal chronic changes seen on FLAIR imaging involving the white matter. No encephalomalacia is noted. No hemorrhages noted. The scan this mostly unrevealing.    Burt Piatek A. Merlene Laughter, M.D.  Diplomate, Tax adviser of Psychiatry and Neurology ( Neurology). 01/26/2021, 5:54 PM

## 2021-01-26 NOTE — Progress Notes (Signed)
   01/26/21 1900 NIH Stroke Scale ( + Modified Stroke Scale Criteria)  Level of Consciousness (1a.)    2 LOC Questions (1b. )   + 2 LOC Commands (1c. )   +  2 Facial Palsy (4. )     0 Patient Readiness for Screen - If "YES" to any of the following questions, WAIT to screen, keep NPO Is patient lethargic or unable to stay alert/awake? (!) Yes HOB restricted to <30 degrees No NPO for planned procedure No Pre-Screen Questions- If "YES" to any of the following questions, STOP the screen, keep NPO, and place order for SLP eval and treat  Home diet required thickened liquids No Trach tube present No Radiation to Head/Neck No Brief Cognitive Screen- Aspiration Risk Assessment Is patient oriented to name, place, or year? No Able to open mouth, stick out tongue, or smile Other Oral Mechanism Exam Able to seal lips  (patient will not wakeup long enough to perfrom complete assessment)  Patient will not stay awake long enough to perform a complete assessment.

## 2021-01-26 NOTE — Plan of Care (Signed)
  Problem: Acute Rehab OT Goals (only OT should resolve) Goal: Pt. Will Perform Eating Flowsheets (Taken 01/26/2021 1141) Pt Will Perform Eating:  with mod assist  sitting  bed level Goal: Pt. Will Perform Grooming Flowsheets (Taken 01/26/2021 1141) Pt Will Perform Grooming:  with mod assist  with min assist  standing  sitting  bed level Goal: Pt. Will Perform Upper Body Dressing Flowsheets (Taken 01/26/2021 1141) Pt Will Perform Upper Body Dressing:  with min assist  with mod assist  bed level  sitting Goal: Pt. Will Perform Lower Body Dressing Flowsheets (Taken 01/26/2021 1141) Pt Will Perform Lower Body Dressing:  with mod assist  with min assist  sitting/lateral leans  with adaptive equipment Goal: Pt. Will Transfer To Toilet Flowsheets (Taken 01/26/2021 1141) Pt Will Transfer to Toilet:  with mod assist  with min assist  grab bars  stand pivot transfer  bedside commode Goal: Pt/Caregiver Will Perform Home Exercise Program Flowsheets (Taken 01/26/2021 1141) Pt/caregiver will Perform Home Exercise Program:  Increased ROM  Increased strength  Both right and left upper extremity  With minimal assist  Aundrey Elahi OT, MOT

## 2021-01-26 NOTE — TOC Initial Note (Signed)
Transition of Care [facility]) - Initial/Assessment Note    Patient Details  Name: [name] MRN: [id number] Date of Birth: 06-03-1966  Transition of Care [facility]) CM/SW Contact:    Iona Beard, [place] Phone Number: 01/26/2021, 3:53 PM  Clinical Narrative:                 CSW spoke with pts son Erlene Quan to complete pts assessment due to pt not being alert. Pt lives with her daughter. Pt is able to complete ADLs independently. Pt has transportation when needed and can drive. Pt has not had [place] services in the past. Pt has a cane and walker to use when needed. CSW informed pts son of PT recommendation for SNF. 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 Pts son also questioning why pt is in the hospital, CSW updated MD that son is requesting an update. TOC to follow.   Expected Discharge Plan: [place] Barriers to Discharge: Continued Medical Work up   Patient Goals and CMS Choice Patient states their goals for this hospitalization and ongoing recovery are:: Go home with [facility] CMS Medicare.gov Compare Post Acute Care list provided to:: Patient Represenative (must comment) Choice offered to / list presented to : [place]  Expected Discharge Plan and Services Expected Discharge Plan: [place] In-house Referral: NA Discharge Planning Services: CM Consult Post Acute Care Choice: [place] arrangements for the past 2 months: Single Family Home                 DME Arranged: N/A DME Agency: NA       HH Arranged: NA HH Agency: NA        Prior Living Arrangements/Services Living arrangements for the past 2 months: Single Family Home Lives with:: Adult Children Patient language and need for interpreter reviewed:: Yes Do you feel safe going back to the place where you live?: Yes      Need for Family Participation in Patient Care: Yes (Comment) Care giver support system in  place?: Yes (comment) Current home services: DME (Walker and cane) Criminal Activity/Legal Involvement Pertinent to Current Situation/Hospitalization: No - Comment as needed  Activities of Daily Living Home Assistive Devices/Equipment: None ADL Screening (condition at time of admission) Patient's cognitive ability adequate to safely complete daily activities?: Yes Is the patient deaf or have difficulty hearing?: No Does the patient have difficulty seeing, even when wearing glasses/contacts?: No Does the patient have difficulty concentrating, remembering, or making decisions?: Yes Patient able to express need for assistance with ADLs?: No Does the patient have difficulty dressing or bathing?: Yes Independently performs ADLs?: No Communication: Needs assistance Is this a change from baseline?: Change from baseline, expected to last <3 days Dressing (OT): Needs assistance Is this a change from baseline?: Change from baseline, expected to last >3 days Grooming: Needs assistance Is this a change from baseline?: Change from baseline, expected to last >3 days Feeding: Needs assistance Is this a change from baseline?: Change from baseline, expected to last >3 days Bathing: Needs assistance Is this a change from baseline?: Change from baseline, expected to last <3 days Toileting: Needs assistance Is this a change from baseline?: Change from baseline, expected to last <3 days In/Out Bed: Needs assistance Is this a change from baseline?: Change from baseline, expected to last <3 days Walks in Home: Independent Does the patient have difficulty walking or  climbing stairs?: Yes Weakness of Legs: Both Weakness of Arms/Hands: Both  Permission Sought/Granted                  Emotional Assessment Appearance:: Appears stated age Attitude/Demeanor/Rapport: Unable to Assess Affect (typically observed): Unable to Assess Orientation: : Fluctuating Orientation (Suspected and/or reported  [place]) Alcohol / Substance Use: Not Applicable Psych Involvement: No (comment)  Admission diagnosis:  Aphasia [R47.01] Stroke-like symptoms [R29.90] Patient Active Problem List   Diagnosis Date Noted  . Aphasia 01/25/2021  . Multiple falls 01/25/2021  . Genetic testing 12/31/2019  . AKI (acute kidney injury) ([place]) 08/06/2019  . Emesis, persistent 08/06/2019  . Viral syndrome 08/05/2019  . Tobacco abuse   . History of colonic polyps 07/23/2019  . Family history of breast cancer   . Family history of colon cancer   . Synovial cyst of lumbar spine 12/20/2018  . Degeneration of lumbar intervertebral disc 12/01/2018  . Myositis 05/11/2016  . Anxiety 05/26/2015  . Tremors of nervous system 05/26/2015  . Neck pain 05/26/2015  . Hot flushes, perimenopausal 07/24/2013  . CAD (coronary artery disease) 04/11/2012  . GERD (gastroesophageal reflux disease) 04/11/2012  . Addison's disease ([place]) 03/03/2012  . QT prolongation 12/15/2010  . Palpitations 01/01/2009  . Hypothyroidism 12/31/2008  . Hyperlipidemia 12/31/2008  . OVERWEIGHT/OBESITY 12/31/2008  . Essential hypertension 12/31/2008   PCP:  Redmond School, MD Pharmacy:   [id number] - [place], [place] [address] Phone: [phone] Fax: [phone]     Social Determinants of Health (SDOH) Interventions    Readmission Risk Interventions No flowsheet data found.

## 2021-01-26 NOTE — Evaluation (Signed)
Physical Therapy Evaluation Patient Details Name: [name] MRN: [id number] DOB: 02/01/1966 Today's Date: 01/26/2021   History of Present Illness  [name] is a 55 y.o. female with a history of hypertension, Addison's disease on chronic steroids, degenerative disc disease with chronic pain in her neck and her lumbar spine, hypothyroidism, hyperlipidemia, GERD, coronary artery disease.  In some medical records, there is documentation of multiple sclerosis, although there are is an MRI in care everywhere with no evidence of demyelinating disease.  Patient presents with this, word finding difficulties, multiple falls over the past 3 days.  The dizziness and word finding difficulty started 3 days ago and the fall started yesterday.  She attributes the falls to increasing weakness of her legs.  No palliating or provoking factors.  She has hit her head and neck and has had increasing back pain because of the falls.  She does have an impending surgery with Dr. Ronnald Ramp in the near future.  Denies chest pain, shortness of breath, fevers, chills, nausea, vomiting.  No new changes in medications.    Clinical Impression  Patient presents very lethargic, limited to answering a few questions and able to participate with frequent verbal/tactile cueing.  Patient demonstrates slow labored movement for sitting up at bedside, once seated limited to holding head up for a few seconds before having to look down due to weakness, frequent loss of sitting balance falling backwards and unable to stand due to BLE weakness and lethargy.  Patient required Max assist to reposition when put back to bed. Patient will benefit from continued physical therapy in hospital and recommended venue below to increase strength, balance, endurance for safe ADLs and gait.     Follow Up Recommendations SNF    Equipment Recommendations  Rolling walker with 5" wheels    Recommendations for Other Services       Precautions /  Restrictions Precautions Precautions: Fall Restrictions Weight Bearing Restrictions: No      Mobility  Bed Mobility Overal bed mobility: Needs Assistance Bed Mobility: Supine to Sit Rolling: Max assist Sidelying to sit: Max assist   Sit to supine: Max assist   General bed mobility comments: very lethargic with poor tolerance for movement    Transfers Overall transfer level: Needs assistance Equipment used: Rolling walker (2 wheeled) Transfers: Sit to/from Stand Sit to Stand: Max assist         General transfer comment: Little active engagement in sit to stand attempt at EOB. Assist to grasp RW.  Ambulation/Gait                Stairs            Wheelchair Mobility    Modified Rankin (Stroke Patients Only)       Balance Overall balance assessment: Needs assistance Sitting-balance support: Feet supported;Bilateral upper extremity supported Sitting balance-Leahy Scale: Poor Sitting balance - Comments: at EOB Postural control: Other (comment) (Anterior leaning.) Standing balance support: Bilateral upper extremity supported;During functional activity Standing balance-Leahy Scale: Poor Standing balance comment: Max A required for brief sit to stand with RW.                             Pertinent Vitals/Pain Pain Assessment: Faces Faces Pain Scale: Hurts little more Pain Location: low back and neck Pain Descriptors / Indicators: Grimacing;Sore;Discomfort Pain Intervention(s): Limited activity within patient's tolerance;Monitored during session;Repositioned    Home Living Family/patient expects to be discharged to:: Private  residence Living Arrangements: Children Available Help at Discharge: Family;Available PRN/intermittently Type of Home: Apartment Home Access: Level entry     Home Layout: One level   Additional Comments: Pt lethargic and a poor historian. Difficulty answering history questoins.    Prior Function            Comments: Pt is a poor historian this date; lethargic with expressive difficulties.     Hand Dominance   Dominant Hand: Right    Extremity/Trunk Assessment   Upper Extremity Assessment Upper Extremity Assessment: Defer to OT evaluation RUE Deficits / Details: 2-/5 grossly. Minimal activation noted when asked to completed shoulder and digit movements. WFL P/ROM. RUE Sensation:  (difficult to assess due to pt arousal level.) RUE Coordination: decreased gross motor;decreased fine motor LUE Deficits / Details: 2-/5 grossly. Trace to minimal A/ROM. Minimal activation noted when asked to completed shoulder and digit movements. WFL P/ROM. LUE Sensation:  (difficult to assess due to pt arousal level.) LUE Coordination: decreased fine motor;decreased gross motor    Lower Extremity Assessment Lower Extremity Assessment: Generalized weakness;RLE deficits/detail;LLE deficits/detail RLE Deficits / Details: grossly 2/5, ankle dorsiflexion 0/5 RLE Coordination: decreased fine motor;decreased gross motor LLE Deficits / Details: grossly 2+/5, ankle dorsiflexion grossly -3/5 (patient very lethargic to fullly assess)    Cervical / Trunk Assessment Cervical / Trunk Assessment:  (Difficulty with neck extension against gravity.)  Communication   Communication: Expressive difficulties  Cognition Arousal/Alertness: Lethargic Behavior During Therapy: Flat affect Overall Cognitive Status: No family/caregiver present to determine baseline cognitive functioning                                        General Comments      Exercises     Assessment/Plan    PT Assessment Patient needs continued PT services  PT Problem List Decreased strength;Decreased activity tolerance;Decreased balance;Decreased mobility;Pain       PT Treatment Interventions DME instruction;Gait training;Stair training;Functional mobility training;Therapeutic activities;Therapeutic exercise;Patient/family  education;Balance training    PT Goals (Current goals can be found in the Care Plan section)  Acute Rehab PT Goals Patient Stated Goal: return home PT Goal Formulation: With patient Time For Goal Achievement: 02/10/21 Potential to Achieve Goals: Good    Frequency Min 3X/week   Barriers to discharge        Co-evaluation PT/OT/SLP Co-Evaluation/Treatment: Yes Reason for Co-Treatment: To address functional/ADL transfers;For patient/therapist safety PT goals addressed during session: Mobility/safety with mobility;Balance OT goals addressed during session: ADL's and self-care;Strengthening/ROM       AM-PAC PT "6 Clicks" Mobility  Outcome Measure Help needed turning from your back to your side while in a flat bed without using bedrails?: A Lot Help needed moving from lying on your back to sitting on the side of a flat bed without using bedrails?: A Lot Help needed moving to and from a bed to a chair (including a wheelchair)?: A Lot Help needed standing up from a chair using your arms (e.g., wheelchair or bedside chair)?: A Lot Help needed to walk in hospital room?: Total Help needed climbing 3-5 steps with a railing? : Total 6 Click Score: 10    End of Session   Activity Tolerance: Patient tolerated treatment well;Patient limited by fatigue;Patient limited by lethargy Patient left: in bed;with call bell/phone within reach;with bed alarm set Nurse Communication: Mobility status PT Visit Diagnosis: Unsteadiness on feet (R26.81);Other abnormalities of gait and mobility (  R26.89);Muscle weakness (generalized) (M62.81)    Time: 0086-7619 PT Time Calculation (min) (ACUTE ONLY): 33 min   Charges:   PT Evaluation $PT Eval Moderate Complexity: 1 Mod PT Treatments $Therapeutic Activity: 23-37 mins        2:05 PM, 01/26/21 Lonell Grandchild, MPT Physical Therapist with [facility] 336 [phone] office [phone] mobile phone

## 2021-01-26 NOTE — Evaluation (Signed)
Occupational Therapy Evaluation Patient Details Name: [name] MRN: [id number] DOB: 12-17-1965 Today's Date: 01/26/2021    History of Present Illness [name] is a 55 y.o. female with a history of hypertension, Addison's disease on chronic steroids, degenerative disc disease with chronic pain in her neck and her lumbar spine, hypothyroidism, hyperlipidemia, GERD, coronary artery disease.  In some medical records, there is documentation of multiple sclerosis, although there are is an MRI in care everywhere with no evidence of demyelinating disease.  Patient presents with this, word finding difficulties, multiple falls over the past 3 days.  The dizziness and word finding difficulty started 3 days ago and the fall started yesterday.  She attributes the falls to increasing weakness of her legs.  No palliating or provoking factors.  She has hit her head and neck and has had increasing back pain because of the falls.  She does have an impending surgery with Dr. Ronnald Ramp in the near future.  Denies chest pain, shortness of breath, fevers, chills, nausea, vomiting.  No new changes in medications.   Clinical Impression   Pt agreeable to OT/PT co-evaluation this date. Pt very lethargic with expressive communication difficulties. Little history taken for these reasons. Pt requires maximal assist for bed mobility and maximal assist for sit to stand with RW. Pt demonstrates very little A/ROM with little more than trace movement noted in B UE when prompted to move upper extremities. Pt presents with low tone and overall lack of muscle activation for mobility. Pt will benefit from continued OT in the hospital and recommended venue below to increase strength, balance, ROM, and endurance for safe ADL's.     Follow Up Recommendations  SNF;Supervision/Assistance - 24 hour    Equipment Recommendations  None recommended by OT           Precautions / Restrictions Precautions Precautions:  Fall Restrictions Weight Bearing Restrictions: No      Mobility Bed Mobility Overal bed mobility: Needs Assistance Bed Mobility: Rolling;Sidelying to Sit;Sit to Supine Rolling: Max assist Sidelying to sit: Max assist   Sit to supine: Max assist   General bed mobility comments: Lethargic. Little muscle activation for bed mobility.    Transfers Overall transfer level: Needs assistance Equipment used: Rolling walker (2 wheeled) Transfers: Sit to/from Stand Sit to Stand: Max assist         General transfer comment: Little active engagement in sit to stand attempt at EOB. Assist to grasp RW.    Balance Overall balance assessment: Needs assistance Sitting-balance support: Feet supported Sitting balance-Leahy Scale: Poor Sitting balance - Comments: at EOB Postural control: Other (comment) (Anterior leaning.) Standing balance support: Bilateral upper extremity supported;During functional activity Standing balance-Leahy Scale: Poor Standing balance comment: Max A required for brief sit to stand with RW.                           ADL either performed or assessed with clinical judgement   ADL Overall ADL's : Needs assistance/impaired                     Lower Body Dressing: Total assistance;Bed level Lower Body Dressing Details (indicate cue type and reason): total assist to don socks at bed level.                     Vision Baseline Vision/History:  (Pt unable to report on baseline vision. Eyes closed majority of session.)  Pertinent Vitals/Pain Pain Assessment: Faces Faces Pain Scale: Hurts little more Pain Location: Noted with B UE P/ROM Pain Descriptors / Indicators: Grimacing Pain Intervention(s): Limited activity within patient's tolerance;Monitored during session;Repositioned;RN gave pain meds during session     Hand Dominance     Extremity/Trunk Assessment Upper Extremity Assessment Upper Extremity Assessment:  RUE deficits/detail;LUE deficits/detail RUE Deficits / Details: 2-/5 grossly. Minimal activation noted when asked to completed shoulder and digit movements. WFL P/ROM. RUE Sensation:  (difficult to assess due to pt arousal level.) RUE Coordination: decreased gross motor;decreased fine motor LUE Deficits / Details: 2-/5 grossly. Trace to minimal A/ROM. Minimal activation noted when asked to completed shoulder and digit movements. WFL P/ROM. LUE Sensation:  (difficult to assess due to pt arousal level.) LUE Coordination: decreased fine motor;decreased gross motor   Lower Extremity Assessment Lower Extremity Assessment: Defer to PT evaluation   Cervical / Trunk Assessment Cervical / Trunk Assessment:  (Difficulty with neck extension against gravity.)   Communication Communication Communication: Expressive difficulties   Cognition Arousal/Alertness: Lethargic Behavior During Therapy: Flat affect (Eyes closed majority of session.) Overall Cognitive Status: No family/caregiver present to determine baseline cognitive functioning                                                      Home Living Family/patient expects to be discharged to:: Private residence Living Arrangements: Children Available Help at Discharge: Family;Available PRN/intermittently Type of Home: Apartment Home Access: Level entry                         Additional Comments: Pt lethargic and a poor historian. Difficulty answering history questoins.      Prior Functioning/Environment          Comments: Pt is a poor historian this date; lethargic with expressive difficulties.        OT Problem List: Decreased strength;Decreased range of motion;Decreased activity tolerance;Impaired balance (sitting and/or standing);Decreased coordination;Decreased cognition;Decreased safety awareness;Decreased knowledge of use of DME or AE;Impaired tone;Impaired UE functional use      OT  Treatment/Interventions: Self-care/ADL training;Therapeutic exercise;Neuromuscular education;Energy conservation;DME and/or AE instruction;Manual therapy;Modalities;Therapeutic activities;Cognitive remediation/compensation;Visual/perceptual remediation/compensation;Patient/family education;Balance training    OT Goals(Current goals can be found in the care plan section) Acute Rehab OT Goals Patient Stated Goal: Requested pain medication from nurse. OT Goal Formulation: Patient unable to participate in goal setting Time For Goal Achievement: 02/09/21 Potential to Achieve Goals: Fair  OT Frequency: Min 2X/week               Co-evaluation PT/OT/SLP Co-Evaluation/Treatment: Yes Reason for Co-Treatment: For patient/therapist safety;To address functional/ADL transfers   OT goals addressed during session: ADL's and self-care;Strengthening/ROM      AM-PAC OT "6 Clicks" Daily Activity     Outcome Measure Help from another person eating meals?: Total Help from another person taking care of personal grooming?: Total Help from another person toileting, which includes using toliet, bedpan, or urinal?: Total Help from another person bathing (including washing, rinsing, drying)?: Total Help from another person to put on and taking off regular upper body clothing?: Total Help from another person to put on and taking off regular lower body clothing?: Total 6 Click Score: 6   End of Session Equipment Utilized During Treatment: Rolling walker  Activity Tolerance: Patient limited by lethargy Patient left:  in bed;with call bell/phone within reach;with bed alarm set  OT Visit Diagnosis: Unsteadiness on feet (R26.81);Other abnormalities of gait and mobility (R26.89);Repeated falls (R29.6);Muscle weakness (generalized) (M62.81);History of falling (Z91.81);Cognitive communication deficit (R41.841)                Time: 2481-8590 OT Time Calculation (min): 25 min Charges:  OT General Charges $OT Visit:  1 Visit OT Evaluation $OT Eval Moderate Complexity: 1 Mod  Alnita Aybar OT, MOT   Larey Seat 01/26/2021, 11:37 AM

## 2021-01-26 NOTE — Progress Notes (Signed)
Patient very difficult to arouse at start of shift. She woke up just enough to give me her name and birthday and swallow her medications. Unable to do a clear stroke assessment on her as she continued to sleep and would not follow any commands. She did respond to sternum rub with facial grimacing and throwing her right arm to her chest. MD Courage notified and checked patient, believed state could possibly be from Ambien and xanax given to pt on previous shift. Patient was sent for MRI.

## 2021-01-27 ENCOUNTER — Inpatient Hospital Stay (HOSPITAL_COMMUNITY)
Admit: 2021-01-27 | Discharge: 2021-01-27 | Disposition: A | Discharge status: Home / Self Care | Payer: Medicare HMO | Attending: Family Medicine | Admitting: Family Medicine

## 2021-01-27 DIAGNOSIS — I10 Essential (primary) hypertension: Secondary | ICD-10-CM | POA: Diagnosis not present

## 2021-01-27 LAB — URINALYSIS, ROUTINE W REFLEX MICROSCOPIC
Bacteria, UA: NONE SEEN
Bilirubin Urine: NEGATIVE
Glucose, UA: NEGATIVE mg/dL
Ketones, ur: NEGATIVE mg/dL
Leukocytes,Ua: NEGATIVE
Nitrite: NEGATIVE
Protein, ur: 30 mg/dL — AB
Specific Gravity, Urine: 1.02 (ref 1.005–1.030)
pH: 6 (ref 5.0–8.0)

## 2021-01-27 LAB — BASIC METABOLIC PANEL
Anion gap: 10 (ref 5–15)
BUN: 33 mg/dL — ABNORMAL HIGH (ref 6–20)
CO2: 23 mmol/L (ref 22–32)
Calcium: 8.7 mg/dL — ABNORMAL LOW (ref 8.9–10.3)
Chloride: 104 mmol/L (ref 98–111)
Creatinine, Ser: 1.29 mg/dL — ABNORMAL HIGH (ref 0.44–1.00)
GFR, Estimated: 49 mL/min — ABNORMAL LOW (ref >60)
Glucose, Bld: 122 mg/dL — ABNORMAL HIGH (ref 70–99)
Potassium: 3.8 mmol/L (ref 3.5–5.1)
Sodium: 137 mmol/L (ref 135–145)

## 2021-01-27 LAB — TSH: TSH: 0.348 u[IU]/mL — ABNORMAL LOW (ref 0.350–4.500)

## 2021-01-27 LAB — GLUCOSE, CAPILLARY
Glucose-Capillary: 122 mg/dL — ABNORMAL HIGH (ref 70–99)
Glucose-Capillary: 141 mg/dL — ABNORMAL HIGH (ref 70–99)
Glucose-Capillary: 155 mg/dL — ABNORMAL HIGH (ref 70–99)
Glucose-Capillary: 169 mg/dL — ABNORMAL HIGH (ref 70–99)

## 2021-01-27 LAB — VITAMIN B12: Vitamin B-12: 415 pg/mL (ref 180–914)

## 2021-01-27 LAB — AMMONIA: Ammonia: 31 umol/L (ref 9–35)

## 2021-01-27 MED ORDER — CARVEDILOL 3.125 MG PO TABS
3.1250 mg | ORAL_TABLET | Freq: Two times a day (BID) | ORAL | Status: DC
  Administered 2021-01-28 – 2021-01-29 (×3): 3.125 mg via ORAL
  Filled 2021-01-27 (×2): qty 1

## 2021-01-27 MED ORDER — TAMSULOSIN HCL 0.4 MG PO CAPS
0.4000 mg | ORAL_CAPSULE | Freq: Every day | ORAL | Status: DC
  Administered 2021-01-27 – 2021-01-30 (×4): 0.4 mg via ORAL
  Filled 2021-01-27 (×4): qty 1

## 2021-01-27 MED ORDER — CLOPIDOGREL BISULFATE 75 MG PO TABS
75.0000 mg | ORAL_TABLET | Freq: Every day | ORAL | Status: DC
  Administered 2021-01-27 – 2021-01-31 (×4): 75 mg via ORAL
  Filled 2021-01-27 (×5): qty 1

## 2021-01-27 MED ORDER — TOPIRAMATE 25 MG PO TABS
25.0000 mg | ORAL_TABLET | Freq: Two times a day (BID) | ORAL | Status: DC
  Administered 2021-01-27 – 2021-01-31 (×7): 25 mg via ORAL
  Filled 2021-01-27 (×8): qty 1

## 2021-01-27 MED ORDER — ASPIRIN 81 MG PO CHEW
81.0000 mg | CHEWABLE_TABLET | Freq: Every day | ORAL | Status: DC
  Administered 2021-01-28 – 2021-01-31 (×3): 81 mg via ORAL
  Filled 2021-01-27 (×4): qty 1

## 2021-01-27 MED ORDER — DEXTROSE-NACL 5-0.45 % IV SOLN: INTRAVENOUS | Status: AC

## 2021-01-27 MED ORDER — HYDROCORTISONE 20 MG PO TABS
20.0000 mg | ORAL_TABLET | Freq: Two times a day (BID) | ORAL | Status: DC
  Administered 2021-01-27 – 2021-01-31 (×7): 20 mg via ORAL
  Filled 2021-01-27 (×12): qty 1

## 2021-01-27 NOTE — TOC Progression Note (Addendum)
Transition of Care [facility]) - Progression Note    Patient Details  Name: [name] MRN: [id number] Date of Birth: March 11, 1966  Transition of Care [facility] CM/SW Contact  Salome Arnt, [place] Phone Number: 01/27/2021, 11:41 AM  Clinical Narrative:  LCSW followed up with pt's son, [name] regarding d/c plan. He states he has talked with MD and agrees pt will need SNF. Discussed SNF placement and facilities and he requests [facility]. He indicates pt has not been COVID vaccinated. [facility] made bed offer and [name] accepts. Debbie at [facility] has started authorization. SNF aware of 30 day pasarr. TOC will continue to follow.     Expected Discharge Plan: [place] Barriers to Discharge: Insurance Authorization  Expected Discharge Plan and Services Expected Discharge Plan: [place] In-house Referral: NA Discharge Planning Services: CM Consult Post Acute Care Choice: [place] arrangements for the past 2 months: Single Family Home                 DME Arranged: N/A DME Agency: NA       HH Arranged: NA HH Agency: NA         Social Determinants of Health (SDOH) Interventions    Readmission Risk Interventions No flowsheet data found.

## 2021-01-27 NOTE — Progress Notes (Signed)
   01/27/21 0000 NIH Stroke Scale ( + Modified Stroke Scale Criteria)  Level of Consciousness (1a.)    0 LOC Questions (1b. )   + 0 LOC Commands (1c. )   +  2 Best Gaze (2. )  + 0 Visual (3. )  + 0 Facial Palsy (4. )     0 Motor Arm, Left (5a. )   + 4 Motor Arm, Right (5b. )   + 4 Motor Leg, Left (6a. )   + 4 Motor Leg, Right (6b. )   + 4 Limb Ataxia (7. ) 2 Sensory (8. )   + 1 Best Language (9. )   + 1 Dysarthria (10. ) 1 Extinction/Inattention (11.)   + 1 Modified SS Total  + 21 Complete NIHSS TOTAL 24 Large Vessel Occlusion (VAN) Screening Weakness Severe Visual Disturbance  Double Vision Aphasia Expressive Neglect NONE VAN Screening Negative Pupils L Pupil Size (mm) 3 L Pupil Reaction Sluggish R Pupil Size (mm) 3 R Pupil Reaction Nonreactive Patient Readiness for Screen - If "YES" to any of the following questions, WAIT to screen, keep NPO Is patient lethargic or unable to stay alert/awake? No HOB restricted to <30 degrees No NPO for planned procedure No Pre-Screen Questions- If "YES" to any of the following questions, STOP the screen, keep NPO, and place order for SLP eval and treat  Home diet required thickened liquids No Trach tube present No Radiation to Head/Neck No Brief Cognitive Screen- Aspiration Risk Assessment Is patient oriented to name, place, or year? Yes Able to open mouth, stick out tongue, or smile Yes Oral Mechanism Exam Able to seal lips Yes Able to move tongue from side to side No Face is symmetric Yes Mandatory Oral Care Performed  Mandatory oral care performed Yes 3 oz Water Swallow Challenge Does the patient stop drinking? No Does the patient cough/choke? No Screening Result Passed  Patient is more awake now after performing in and out cath. She is able to tell me her name, where she is and why she is here. She stated she fell several times at home and that she hit her head and lost control of her bladder at home. She complains of blurry  vision.

## 2021-01-27 NOTE — Progress Notes (Addendum)
Patient Demographics:    [name], is a 55 y.o. female, DOB - 1965-09-12, VVO:[id number]  Admit date - 01/25/2021   Admitting Physician Joy Gravelle Denton Brick, MD  Outpatient Primary MD for the patient is Joy School, MD  LOS - 1   Chief Complaint  Patient presents with  . Dizziness        Subjective:    [name] today has no fevers, no emesis,  No chest pain,   Sister [name] at bedside -More awake, more talkative, oral intake is better -No BM, not voiding bladder scan less than 200 mL   Assessment  & Plan :    Principal Problem:   Acute CVA/punctate acute or subacute infarction in the lower Lt Paramedian Mid Brain Active Problems:   Aphasia   Acute metabolic encephalopathy   Multiple falls   Hypothyroidism   Essential hypertension   QT prolongation   Addison's disease (HCC)   CAD (coronary artery disease)   GERD (gastroesophageal reflux disease)   Degeneration of lumbar intervertebral disc  Brief Summary: 55 year old reformed smoker with past medical history relevant for Addison's disease, hypothyroidism, HTN, CAD, GERD and chronic back pain on chronic opiates admitted on 01/25/2021 with recurrent falls and aphasia and found to have acute stroke  A/p  1)Acute CVA/punctate acute or subacute infarction in the lower Lt Paramedian Mid Brain -CT head and brain MRI noted -EEG pending -Neurology consult requested -PTA patient was on aspirin 81 mg daily, ?  Aspirin failure so give Aspirin 81 mg daily along with Plavix 75 mg daily for 30 days then after that STOP the aspirin and continue ONLY Plavix 75 mg daily indefinitely--for secondary stroke prevention -PTA patient was on Lipitor 20 mg daily increased to 40 mg daily -PT/OT and speech pathologist eval appreciated---recommends SNF -Allow permissive hypertension-discontinued amlodipine -Patient had MRA head and MRA neck on November 14, 2020  without LVO -Echo with preserved EF of 55 to 60% without wall motion abnormalities or significant valvular problems -Watch for arrhythmias on telemetry unit -A1c is 5.7,  -LDL is 54 with HDL of 50---Even if her lipid panel is within desired limits, patient should still take Lipitor for it's Pleiotropic effects (beyond cholesterol lowering benefits)  2) aphasia/dysphagia--speech pathology eval appreciated  3) chronic back pain--- patient has had numerous injections and procedures and prior surgeries for neck and lower back pain currently on opiates chronically  -patient was scheduled for surgery on 02/04/2021 this will have to be postponed for now given acute stroke and need for Plavix -Patient has urinary retention and worsening back pain we will get lumbar MRI this admission -given recent falls -Be judicious with opiates and muscle relaxants  4) Addison's disease--- awake, able to take oral intake will transition back to oral hydrocortisone at 20 mg twice daily  5)GERD-Protonix as ordered  6)Hypothyroidism--- continue levothyroxine  7) acute metabolic encephalopathy--UA and chest x-ray without evidence of infection -UDS with opiates, benzos and THC, patient is prescribed opiates and benzos -Altered mentation was most likely stroke related, awareness and mental status has improved significantly over the last 24 hours  8)Social/Ethics--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 -Plan of care discussed with patient's son [name] and patient's Sister [name], patient is  a full code  9) urinary retention--- in and out catheterization with over 900 mL of urine, give Flomax, -Lumbar MRI as above #3  Disposition/Need for in-Hospital Stay- patient unable to be discharged at this time due to -acute CVA --- awaiting insurance authorization to go to [facility] rehab   Status is: Inpatient  Remains inpatient appropriate because:please see disposition  above   Disposition: The patient is from: Home              Anticipated d/c is to: SNF              Anticipated d/c date is: 2 days--              Patient currently is medically stable to d/c. Barriers: Awaiting insurance authorization  Code Status :  -  Code Status: Full Code   Family Communication:  discussed with patient's son [name] and patient's Sister [name], Consults  :  Neuro requested  DVT Prophylaxis  :   - SCDs  SCD's Start: 01/26/21 0126 enoxaparin (LOVENOX) injection 40 mg Start: 01/25/21 2100   Lab Results  Component Value Date   PLT 348 01/26/2021    Inpatient Medications  Scheduled Meds: . aspirin  300 mg Rectal Daily  . atorvastatin  40 mg Oral Daily  . enoxaparin (LOVENOX) injection  40 mg Subcutaneous Q24H  . gabapentin  600 mg Oral QID  . hydrocortisone sod succinate (SOLU-CORTEF) inj  50 mg Intravenous Daily  . levothyroxine  75 mcg Oral QAC breakfast  . polyethylene glycol  17 g Oral Daily  . senna-docusate  2 tablet Oral QHS  . zolpidem  5 mg Oral QHS   Continuous Infusions: . dextrose 5 % and 0.45% NaCl 100 mL/hr at 01/27/21 1337   PRN Meds:.acetaminophen **OR** acetaminophen (TYLENOL) oral liquid 160 mg/5 mL **OR** acetaminophen, meclizine, oxyCODONE    Anti-infectives (From admission, onward)   Start     Dose/Rate Route Frequency Ordered Stop   01/26/21 0600  vancomycin (VANCOCIN) IVPB 1000 mg/200 mL premix  Status:  Discontinued        1,000 mg 200 mL/hr over 60 Minutes Intravenous On call to O.R. 01/26/21 0125 01/26/21 1815        Objective:   Vitals:   01/27/21 0143 01/27/21 0737 01/27/21 1111 01/27/21 1432  BP: (!) 170/85 (!) 181/93 (!) 150/89 128/66  Pulse: 82 86 87 81  Resp: 18   18  Temp: 99.6 F (37.6 C) 98.6 F (37 C) 99.5 F (37.5 C) 99.8 F (37.7 C)  TempSrc: Rectal Oral Oral Oral  SpO2: 96% 97% 96% 96%  Weight:        Wt Readings from Last 3 Encounters:  01/25/21 81 kg  11/13/20 80.7 kg  10/21/20 80.7 kg      Intake/Output Summary (Last 24 hours) at 01/27/2021 1614 Last data filed at 01/27/2021 1300 Gross per 24 hour  Intake 1406.07 ml  Output 600 ml  Net 806.07 ml   Physical Exam  Gen:- sleepy, in no apparent distress  HEENT:- [place].AT, No sclera icterus Neck-Supple Neck,No JVD,.  Lungs-  CTAB , fair symmetrical air movement CV- S1, S2 normal, regular  Abd-  +ve B.Sounds, Abd Soft, No tenderness,    Extremity/Skin:- No  edema, pedal pulses present  NeuroPsych-affect is flat, neuro exam is limited, concerns about speech  Difficulties, mostly expressive aphasia   Data Review:   Micro Results Recent Results (from the past 240 hour(s))  Resp Panel by RT-PCR (  Flu A&B, Covid) Nasopharyngeal Swab     Status: None   Collection Time: 01/25/21  3:40 PM   Specimen: Nasopharyngeal Swab; Nasopharyngeal(NP) swabs in vial transport medium  Result Value Ref Range Status   SARS Coronavirus 2 by RT PCR NEGATIVE NEGATIVE Final    Comment: (NOTE) SARS-CoV-2 target nucleic acids are NOT DETECTED.  The SARS-CoV-2 RNA is generally detectable in upper respiratory specimens during the acute phase of infection. The lowest concentration of SARS-CoV-2 viral copies this assay can detect is 138 copies/mL. A negative result does not preclude SARS-Cov-2 infection and should not be used as the sole basis for treatment or other patient management decisions. A negative result may occur with  improper specimen collection/handling, submission of specimen other than nasopharyngeal swab, presence of viral mutation(s) within the areas targeted by this assay, and inadequate number of viral copies(<138 copies/mL). A negative result must be combined with clinical observations, patient history, and epidemiological information. The expected result is Negative.  Fact Sheet for Patients:  EntrepreneurPulse.com.au  Fact Sheet for Healthcare Providers:  IncredibleEmployment.be  This  test is no t yet approved or cleared by the [place] FDA and  has been authorized for detection and/or diagnosis of SARS-CoV-2 by FDA under an Emergency Use Authorization (EUA). This EUA will remain  in effect (meaning this test can be used) for the duration of the COVID-19 declaration under Section 564(b)(1) of the Act, 21 U.S.C.section 360bbb-3(b)(1), unless the authorization is terminated  or revoked sooner.       Influenza A by PCR NEGATIVE NEGATIVE Final   Influenza B by PCR NEGATIVE NEGATIVE Final    Comment: (NOTE) The Xpert Xpress SARS-CoV-2/FLU/RSV plus assay is intended as an aid in the diagnosis of influenza from Nasopharyngeal swab specimens and should not be used as a sole basis for treatment. Nasal washings and aspirates are unacceptable for Xpert Xpress SARS-CoV-2/FLU/RSV testing.  Fact Sheet for Patients: EntrepreneurPulse.com.au  Fact Sheet for Healthcare Providers: IncredibleEmployment.be  This test is not yet approved or cleared by the [place] FDA and has been authorized for detection and/or diagnosis of SARS-CoV-2 by FDA under an Emergency Use Authorization (EUA). This EUA will remain in effect (meaning this test can be used) for the duration of the COVID-19 declaration under Section 564(b)(1) of the Act, 21 U.S.C. section 360bbb-3(b)(1), unless the authorization is terminated or revoked.  Performed at [facility], [address], [place], [place] [address]     Radiology Reports CT ABDOMEN PELVIS WO CONTRAST  Result Date: 01/25/2021 CLINICAL DATA:  Fall.  Abdominal trauma. EXAM: CT ABDOMEN AND PELVIS WITHOUT CONTRAST TECHNIQUE: Multidetector CT imaging of the abdomen and pelvis was performed following the standard protocol without IV contrast. COMPARISON:  10/24/2019 FINDINGS: Lower chest: No acute abnormality. Hepatobiliary: Prior cholecystectomy. No focal hepatic abnormality or evidence of perihepatic  hematoma. Pancreas: No focal abnormality or ductal dilatation. Spleen: No splenic injury or perisplenic hematoma. Adrenals/Urinary Tract: No adrenal hemorrhage or renal injury identified. Bladder is unremarkable. Stomach/Bowel: Moderate stool burden throughout the colon. Stomach, large and small bowel grossly unremarkable. Normal appendix. Vascular/Lymphatic: Aortic atherosclerosis. No evidence of aneurysm or adenopathy. Reproductive: Prior hysterectomy.  No adnexal masses. Other: No free fluid or free air. Musculoskeletal: No acute bony abnormality. IMPRESSION: No acute findings in the abdomen or pelvis. Aortic atherosclerosis. Moderate stool burden. Electronically Signed   By: Rolm Baptise M.D.   On: 01/25/2021 14:46   Chest 2 View  Result Date: 01/26/2021 CLINICAL DATA:  Dizziness and weakness  EXAM: CHEST - 2 VIEW COMPARISON:  01/10/2020 FINDINGS: The heart size and mediastinal contours are within normal limits. Both lungs are clear. The visualized skeletal structures are unremarkable. IMPRESSION: No active cardiopulmonary disease. Electronically Signed   By: Ulyses Jarred M.D.   On: 01/26/2021 02:31   CT HEAD WO CONTRAST  Result Date: 01/25/2021 CLINICAL DATA:  Neuro deficit. Acute stroke suspected. Dizziness and weakness. Ten falls yesterday. Pain. EXAM: CT HEAD WITHOUT CONTRAST CT CERVICAL SPINE WITHOUT CONTRAST TECHNIQUE: Multidetector CT imaging of the head and cervical spine was performed following the standard protocol without intravenous contrast. Multiplanar CT image reconstructions of the cervical spine were also generated. COMPARISON:  None. FINDINGS: CT HEAD FINDINGS Brain: No subdural, epidural, or subarachnoid hemorrhage. Cerebellum, brainstem, and basal cisterns are normal. Ventricles and sulci are unremarkable. No mass effect or midline shift. No acute cortical ischemia or infarct. Vascular: No hyperdense vessel or unexpected calcification. Skull: Normal. Negative for fracture or focal  lesion. Sinuses/Orbits: No acute finding. Other: None. CT CERVICAL SPINE FINDINGS Alignment: Evaluation is somewhat limited due to motion. Within the limitations of motion, no traumatic malalignment is identified. Skull base and vertebrae: No acute fracture. No primary bone lesion or focal pathologic process. Soft tissues and spinal canal: No prevertebral fluid or swelling. No visible canal hematoma. Disc levels: Patient is status post anterior plate screw fusion C5 through C7. Hardware is in good position. Mild degenerative disc disease with small anterior and posterior osteophytes. Upper chest: Negative. Other: No other abnormalities. IMPRESSION: 1. No acute intracranial abnormalities identified. 2. Evaluation of the cervical spine is mildly limited by motion. Within these limitations, no fracture or traumatic malalignment identified. Electronically Signed   By: Dorise Bullion III M.D   On: 01/25/2021 14:56   CT Cervical Spine Wo Contrast  Result Date: 01/25/2021 CLINICAL DATA:  Neuro deficit. Acute stroke suspected. Dizziness and weakness. Ten falls yesterday. Pain. EXAM: CT HEAD WITHOUT CONTRAST CT CERVICAL SPINE WITHOUT CONTRAST TECHNIQUE: Multidetector CT imaging of the head and cervical spine was performed following the standard protocol without intravenous contrast. Multiplanar CT image reconstructions of the cervical spine were also generated. COMPARISON:  None. FINDINGS: CT HEAD FINDINGS Brain: No subdural, epidural, or subarachnoid hemorrhage. Cerebellum, brainstem, and basal cisterns are normal. Ventricles and sulci are unremarkable. No mass effect or midline shift. No acute cortical ischemia or infarct. Vascular: No hyperdense vessel or unexpected calcification. Skull: Normal. Negative for fracture or focal lesion. Sinuses/Orbits: No acute finding. Other: None. CT CERVICAL SPINE FINDINGS Alignment: Evaluation is somewhat limited due to motion. Within the limitations of motion, no traumatic  malalignment is identified. Skull base and vertebrae: No acute fracture. No primary bone lesion or focal pathologic process. Soft tissues and spinal canal: No prevertebral fluid or swelling. No visible canal hematoma. Disc levels: Patient is status post anterior plate screw fusion C5 through C7. Hardware is in good position. Mild degenerative disc disease with small anterior and posterior osteophytes. Upper chest: Negative. Other: No other abnormalities. IMPRESSION: 1. No acute intracranial abnormalities identified. 2. Evaluation of the cervical spine is mildly limited by motion. Within these limitations, no fracture or traumatic malalignment identified. Electronically Signed   By: Dorise Bullion III M.D   On: 01/25/2021 14:56   MR BRAIN WO CONTRAST  Result Date: 01/26/2021 CLINICAL DATA:  Acute stroke presentation. Dizziness and weakness with falling. EXAM: MRI HEAD WITHOUT CONTRAST TECHNIQUE: Multiplanar, multiecho pulse sequences of the brain and surrounding structures were obtained without intravenous contrast. COMPARISON:  Head  CT yesterday.  MRI 11/14/2020 FINDINGS: Brain: Newly seen punctate acute or subacute infarction in the lower left paramedian mid brain. No other acute finding. The brainstem and cerebellum appear otherwise normal. Cerebral hemispheres show a few punctate foci of T2 and FLAIR signal within the deep and subcortical white matter, often seen at this age. No cortical or large vessel territory infarction. No mass lesion, hemorrhage, hydrocephalus or extra-axial collection. Vascular: Major vessels at the base of the brain show flow. Skull and upper cervical spine: Negative Sinuses/Orbits: Clear/normal Other: None IMPRESSION: Subtle evidence of a punctate acute infarction in the lower left paramedian mid brain. No finding was present in this location on the study of 11/14/2020. The remainder the brain is essentially normal for age, with a few punctate foci of T2 and FLAIR signal within the  cerebral hemispheric white matter as seen previously. Electronically Signed   By: Nelson Chimes M.D.   On: 01/26/2021 11:26   MM 3D SCREEN BREAST BILATERAL  Result Date: 01/12/2021 CLINICAL DATA:  Screening. EXAM: DIGITAL SCREENING BILATERAL MAMMOGRAM WITH TOMOSYNTHESIS AND CAD TECHNIQUE: Bilateral screening digital craniocaudal and mediolateral oblique mammograms were obtained. Bilateral screening digital breast tomosynthesis was performed. The images were evaluated with computer-aided detection. COMPARISON:  Previous exam(s). ACR Breast Density Category b: There are scattered areas of fibroglandular density. FINDINGS: There are no findings suspicious for malignancy. The images were evaluated with computer-aided detection. IMPRESSION: No mammographic evidence of malignancy. A result letter of this screening mammogram will be mailed directly to the patient. RECOMMENDATION: Screening mammogram in one year. (Code:SM-B-01Y) BI-RADS CATEGORY  1: Negative. Electronically Signed   By: Nolon Nations M.D.   On: 01/12/2021 15:42   ECHOCARDIOGRAM LIMITED  Result Date: 01/26/2021    ECHOCARDIOGRAM LIMITED REPORT   Patient Name:   TATISHA CERINO Date of Exam: 01/26/2021 Medical Rec #:  [id number]      Height:       65.5 in Accession #:    [id number]     Weight:       178.6 lb Date of Birth:  [birth year]       BSA:          1.896 m Patient Age:    9 years       BP:           175/101 mmHg Patient Gender: F              HR:           90 bpm. Exam Location:  [facility] Procedure: Limited Echo Indications:    Stroke I63.9  History:        Patient has prior history of Echocardiogram examinations, most                 recent 10/08/2020. CAD, Signs/Symptoms:Chest Pain; Risk                 Factors:Former Smoker and Hypertension. GERD, Mitral Valve                 Prolapse.  Sonographer:    Leavy Cella RDCS (AE) Referring Phys: [id number] Josaiah Muhammed IMPRESSIONS  1. Left ventricular ejection fraction, by estimation, is 55 to  60%. The left ventricle has normal function. The left ventricle has no regional wall motion abnormalities. There is mild left ventricular hypertrophy.  2. Right ventricular systolic function is normal. The right ventricular size is normal.  3. The mitral valve is normal in structure. No evidence of mitral  valve regurgitation.  4. Aortic valve regurgitation is not visualized. Mild aortic valve sclerosis is present, with no evidence of aortic valve stenosis.  5. The inferior vena cava is normal in size with greater than 50% respiratory variability, suggesting right atrial pressure of 3 mmHg. Comparison(s): The left ventricular function is unchanged. FINDINGS  Left Ventricle: Left ventricular ejection fraction, by estimation, is 55 to 60%. The left ventricle has normal function. The left ventricle has no regional wall motion abnormalities. The left ventricular internal cavity size was normal in size. There is  mild left ventricular hypertrophy. Right Ventricle: The right ventricular size is normal. Right vetricular wall thickness was not assessed. Right ventricular systolic function is normal. Left Atrium: Left atrial size was normal in size. Right Atrium: Right atrial size was normal in size. Pericardium: There is no evidence of pericardial effusion. Mitral Valve: The mitral valve is normal in structure. Tricuspid Valve: The tricuspid valve is normal in structure. Tricuspid valve regurgitation is not demonstrated. Aortic Valve: Aortic valve regurgitation is not visualized. Mild aortic valve sclerosis is present, with no evidence of aortic valve stenosis. Pulmonic Valve: The pulmonic valve was not well visualized. Aorta: The aortic root is normal in size and structure. Venous: The inferior vena cava is normal in size with greater than 50% respiratory variability, suggesting right atrial pressure of 3 mmHg. LEFT VENTRICLE PLAX 2D LVIDd:         4.01 cm  Diastology LVIDs:         2.88 cm  LV e' medial:    5.34 cm/s LV  PW:         1.18 cm  LV E/e' medial:  11.1 LV IVS:        1.31 cm  LV e' lateral:   11.00 cm/s LVOT diam:     1.90 cm  LV E/e' lateral: 5.4 LVOT Area:     2.84 cm  LEFT ATRIUM           Index LA diam:      3.30 cm 1.74 cm/m LA Vol (A4C): 26.0 ml 13.71 ml/m   AORTA Ao Root diam: 2.80 cm MITRAL VALVE MV Area (PHT): 4.21 cm     SHUNTS MV Decel Time: 180 msec     Systemic Diam: 1.90 cm MV E velocity: 59.10 cm/s MV A velocity: 104.00 cm/s MV E/A ratio:  0.57 Dorris Carnes MD Electronically signed by Dorris Carnes MD Signature Date/Time: 01/26/2021/5:30:21 PM    Final      CBC Recent Labs  Lab 01/25/21 1451 01/25/21 1505 01/26/21 0409  WBC 11.5*  --  11.3*  HGB 17.2* 18.4* 16.6*  HCT 53.4* 54.0* 50.8*  PLT 342  --  348  MCV 90.4  --  89.9  MCH 29.1  --  29.4  MCHC 32.2  --  32.7  RDW 14.6  --  14.6  LYMPHSABS 2.4  --  2.5  MONOABS 1.2*  --  1.1*  EOSABS 0.2  --  0.1  BASOSABS 0.1  --  0.1    Chemistries  Recent Labs  Lab 01/25/21 1451 01/25/21 1505 01/26/21 0409 01/27/21 0448  NA 141 145 140 137  K 3.5 3.6 3.7 3.8  CL 105 107 106 104  CO2 26  --  22 23  GLUCOSE 93 91 113* 122*  BUN 16 17 20  33*  CREATININE 1.09* 1.10* 1.15* 1.29*  CALCIUM 9.7  --  9.3 8.7*  AST 21  --   --   --  ALT 17  --   --   --   ALKPHOS 87  --   --   --   BILITOT 0.9  --   --   --    ------------------------------------------------------------------------------------------------------------------ Recent Labs    01/26/21 0410  CHOL 137  HDL 50  LDLCALC 54  TRIG 163*  CHOLHDL 2.7    Lab Results  Component Value Date   HGBA1C 5.7 (H) 01/26/2021   ------------------------------------------------------------------------------------------------------------------ Recent Labs    01/27/21 0448  TSH 0.348*   ------------------------------------------------------------------------------------------------------------------ Recent Labs    01/27/21 0958  VITAMINB12 415    Coagulation  profile Recent Labs  Lab 01/25/21 1451 01/26/21 0409  INR 1.0 1.1    No results for input(s): DDIMER in the last 72 hours.  Cardiac Enzymes No results for input(s): CKMB, TROPONINI, MYOGLOBIN in the last 168 hours.  Invalid input(s): CK ------------------------------------------------------------------------------------------------------------------    Component Value Date/Time   BNP 59.0 08/05/2019 1958     Caydence Koenig M.D on 01/27/2021 at 4:14 PM  Go to [email or web address] - for contact info  [facility] - Office  [phone]

## 2021-01-27 NOTE — Progress Notes (Addendum)
Krum A. Merlene Laughter, MD     [email or web address]          [name] is an 55 y.o. female.   ASSESSMENT/PLAN: 1.  Recurrent Vertiginous symptoms of unclear etiology. MRI shows questionable left midbrain lacunar infarct. This is somewhat unimpressive.  The son seems to indicate that the vertiginous symptoms is recurrent and the lacune infarct if indeed present would not explain the recurrent ongoing vertiginous symptoms. She does have multiple medical comorbidities including coronary artery disease, Addison's insufficiency, congestive heart failure and COPD which could contribute or cause the vertiginous symptoms.  If indeed the patient does have a lacunar infarct, aspirin 81 mg should suffice. 2. Marked stupor/encephalopathy in the hospital with is undoubtedly due to medication effect. Minimize psychotropic medications. EEG suggested left temporal epileptiform discharges. Consequently, the patient will be started on antiseizure medication. Topiramate 25 mg twice a day will be initiated for now. This may also help with chronic pain issues that she has.    It appears that the patient has improved. She is no longer stuporous. However, her history is confusing and conflicting. She initially stated that she has infrequent dizziness/vertigo. However she also went on to say that she has meclizine which she takes for vertigo and she has had her current episodes of dizziness which she has been told does go to the emergency room by her cardiologist when they occur. She reports having falling spells although her son reports she may have only fallen twice recently. The patient became confused briefly during the evaluation relating that she was told she is going to have surgery in this hospital to exam rooms down. EEG is a briefly reviewed and in formally shows epileptiform like discharges involving the left temple region that phase reverses at F7.    GENERAL:  She is much more awake  and responsive.  HEENT:   Neck is supple no trauma noted.  ABDOMEN: soft  EXTREMITIES: No edema   BACK: Normal  SKIN: Normal by inspection.    MENTAL STATUS:   She is awake and alert and converses although history is occasionally confusing.  CRANIAL NERVES: Pupils are equal, round and reactive to light; extra ocular movements are full, there is no significant nystagmus; visual fields unable to be tested given the severe stupor; upper and lower facial muscles are normal in strength and symmetric, there is no flattening of the nasolabial folds; tongue is midline  MOTOR:  2 to 3+ movement to deep painful stimuli in the upper extremities.  COORDINATION:  No tremors, myoclonus or abnormal movements or parkinsonism noted.  REFLEXES: Deep tendon reflexes are symmetrical and normal.   SENSATION: Normal pain.     Blood pressure (!) 163/87, pulse 72, temperature 99.5 F (37.5 C), temperature source Oral, resp. rate 18, weight 81 kg, SpO2 97 %.  Past Medical History:  Diagnosis Date  . Addison's disease ([place])   . Addison's disease ([place])   . Adrenal insufficiency ([place])    diagnosed 2012  . Aneurysm ([place])   . Anxiety   . Arthritis   . Astigmatism   . CAD (coronary artery disease)    Cath 2008 EF normal. RCA 50-60, Septal 50%. Myoview 3/12: EF 53% normal perfusion  . Cardiac arrest ([place])    2/2 adissonian crisis  . Cardiomyopathy    resolved  . Chest pain    chronicc  . CHF (congestive heart failure) ([place])   . Chronic back pain   . Chronic diarrhea   .  Concussion    sept 28th 2014  . Family history of breast cancer   . Family history of colon cancer   . Gastroparesis   . GERD (gastroesophageal reflux disease)   . HTN (hypertension)   . Hyperlipidemia   . Hypothyroidism   . Mitral valve prolapse   . Nondiabetic gastroparesis   . PONV (postoperative nausea and vomiting)   . QT prolongation   . Tobacco abuse    down to 2 cigarettes per day  . Vertigo     Past  Surgical History:  Procedure Laterality Date  . ABDOMINAL HYSTERECTOMY    . CARDIAC CATHETERIZATION  03/2007   showed 60% lesion in the right coronary artery  . CHOLECYSTECTOMY    . LEFT HEART CATHETERIZATION WITH CORONARY ANGIOGRAM N/A 04/13/2012   Procedure: LEFT HEART CATHETERIZATION WITH CORONARY ANGIOGRAM;  Surgeon: Hillary Bow, MD;  Location: [facility] CATH LAB;  Service: Cardiovascular;  Laterality: N/A;  . LUMBAR LAMINECTOMY/DECOMPRESSION MICRODISCECTOMY Left 12/20/2018   Procedure: Left Lumbar Three-Four Lumbar Laminotomy and Foraminotomy, Lumbar Four-Five Laminotomy and Foraminotomy with Microdiscectomy and Resection of Synovial Cyst;  Surgeon: Jovita Gamma, MD;  Location: [facility];  Service: Neurosurgery;  Laterality: Left;  Left Lumbar 3-4 Lumbar laminotomy, foraminotomy, possible microdiscectomy with possible resection of synovial cyst  . SPINE SURGERY    . VARICOSE VEIN SURGERY    . VESICOVAGINAL FISTULA CLOSURE W/ TAH      Family History  Problem Relation Age of Onset  . Cancer Father        Colon  . Coronary artery disease Other   . Heart attack Mother        d. 35  . Breast cancer Paternal Uncle 5  . Dementia Maternal Grandmother   . Leukemia Maternal Uncle 1    Social History:  reports that she quit smoking 2 days ago. Her smoking use included cigarettes. She has a 2.50 pack-year smoking history. She has never used smokeless tobacco. She reports current drug use. Drugs: Marijuana and Oxycodone. She reports that she does not drink alcohol.  Allergies:  Allergies  Allergen Reactions  . Bee Venom Anaphylaxis  . Doxycycline Other (See Comments) and Nausea Only    Due to Pre-Existing conditions involved with stomach, patient does not take the following medication  . Erythromycin Other (See Comments) and Nausea And Vomiting    Due to Pre-Existing conditions involved with stomach, patient does not take the following medication  . Ibuprofen Nausea Only    gastroparesis    . Penicillins Anaphylaxis and Swelling    Has patient had a PCN reaction causing immediate rash, facial/tongue/throat swelling, SOB or lightheadedness with hypotension: Yes Has patient had a PCN reaction causing severe rash involving mucus membranes or skin necrosis: Yes Has patient had a PCN reaction that required hospitalization No Has patient had a PCN reaction occurring within the last 10 years: No If all of the above answers are "NO", then may proceed with Cephalosporin use.   . Sulfa Antibiotics Hives and Itching  . Cat Hair Extract Hives, Itching and Swelling    SWELLING REACTION UNSPECIFIED   . Dust Mite Extract Hives and Swelling  . Tramadol Diarrhea, Nausea And Vomiting and Nausea Only  . Morphine And Related Itching    Medications: Prior to Admission medications   Medication Sig Start Date End Date Taking? Authorizing Provider  albuterol (VENTOLIN HFA) 108 (90 Base) MCG/ACT inhaler Inhale 1-2 puffs into the lungs every 6 (six) hours as needed for wheezing  or shortness of breath.  10/22/19  Yes [provider]  alendronate (FOSAMAX) 70 MG tablet Take 70 mg by mouth once a week. 09/15/20  Yes [provider]  ALPRAZolam Duanne Moron) 0.5 MG tablet Take 0.5 mg by mouth 3 (three) times daily. 10/09/20  Yes [provider]  amLODipine (NORVASC) 5 MG tablet Take 1 tablet (5 mg total) by mouth daily. Patient taking differently: Take 10 mg by mouth daily. 01/14/21  Yes Hilty, Nadean Corwin, MD  aspirin 81 MG chewable tablet Chew 81 mg by mouth daily.    Yes [provider]  atorvastatin (LIPITOR) 20 MG tablet Take 20 mg by mouth daily. 10/08/20  Yes [provider]  BACLOFEN PO Take 1 tablet by mouth 3 (three) times daily.   Yes [provider]  buPROPion (WELLBUTRIN SR) 150 MG 12 hr tablet Take 150 mg by mouth 2 (two) times daily.   Yes [provider]  cholecalciferol (VITAMIN D) 1000 UNITS tablet Take 1,000 Units by mouth daily.   Yes  [provider]  gabapentin (NEURONTIN) 600 MG tablet Take 600 mg by mouth 4 (four) times daily. 07/25/19  Yes [provider]  hydrocortisone (CORTEF) 10 MG tablet Take 10-20 mg by mouth See admin instructions. Takes 20 mg in the morning and 10 mg at night 04/07/16  Yes [provider]  levothyroxine (SYNTHROID, LEVOTHROID) 75 MCG tablet Take 75 mcg by mouth daily before breakfast.   Yes [provider]  meclizine (ANTIVERT) 25 MG tablet 1 or 2 tabs PO q8h prn dizziness Patient taking differently: Take 25-50 mg by mouth 3 (three) times daily as needed (for vertigo). 04/16/16  Yes Francine Graven, DO  nitrofurantoin, macrocrystal-monohydrate, (MACROBID) 100 MG capsule Take 100 mg by mouth 2 (two) times daily. 01/22/21  Yes [provider]  nitroGLYCERIN (NITROSTAT) 0.4 MG SL tablet Place 1 tablet (0.4 mg total) under the tongue every 5 (five) minutes as needed for chest pain. 09/17/20  Yes Hilty, Nadean Corwin, MD  ondansetron (ZOFRAN ODT) 4 MG disintegrating tablet Take 1 tablet (4 mg total) by mouth every 8 (eight) hours as needed for nausea. 08/07/19  Yes Emokpae, Courage, MD  oxyCODONE (ROXICODONE) 15 MG immediate release tablet Take 15 mg by mouth every 4 (four) hours as needed for pain.  07/27/19  Yes [provider]  tiZANidine (ZANAFLEX) 4 MG tablet Take 4 mg by mouth 4 (four) times daily as needed. 10/14/20  Yes [provider]  zolpidem (AMBIEN) 10 MG tablet Take 10 mg by mouth at bedtime. 05/06/16  Yes [provider]  furosemide (LASIX) 20 MG tablet TAKE 1 TABLET BY MOUTH EVERY DAY Patient not taking: No sig reported 12/09/20   Pixie Casino, MD    Scheduled Meds: . [START ON 01/28/2021] aspirin  81 mg Oral Daily  . atorvastatin  40 mg Oral Daily  . [START ON 01/28/2021] carvedilol  3.125 mg Oral BID WC  . clopidogrel  75 mg Oral Daily  . enoxaparin (LOVENOX) injection  40 mg Subcutaneous Q24H  . gabapentin  600 mg Oral QID   . hydrocortisone  20 mg Oral BID  . levothyroxine  75 mcg Oral QAC breakfast  . polyethylene glycol  17 g Oral Daily  . senna-docusate  2 tablet Oral QHS  . tamsulosin  0.4 mg Oral QPC supper  . zolpidem  5 mg Oral QHS   Continuous Infusions: . dextrose 5 % and 0.45% NaCl 50 mL/hr at 01/27/21 2951  PRN Meds:.acetaminophen **OR** acetaminophen (TYLENOL) oral liquid 160 mg/5 mL **OR** acetaminophen, meclizine, oxyCODONE     Results for orders placed or performed during the hospital encounter of 01/25/21 (from the past 48 hour(s))  CBC WITH DIFFERENTIAL     Status: Abnormal   Collection Time: 01/26/21  4:09 AM  Result Value Ref Range   WBC 11.3 (H) 4.0 - 10.5 K/uL   RBC 5.65 (H) 3.87 - 5.11 MIL/uL   Hemoglobin 16.6 (H) 12.0 - 15.0 g/dL   HCT 50.8 (H) 36.0 - 46.0 %   MCV 89.9 80.0 - 100.0 fL   MCH 29.4 26.0 - 34.0 pg   MCHC 32.7 30.0 - 36.0 g/dL   RDW 14.6 11.5 - 15.5 %   Platelets 348 150 - 400 K/uL   nRBC 0.0 0.0 - 0.2 %   Neutrophils Relative % 67 %   Neutro Abs 7.5 1.7 - 7.7 K/uL   Lymphocytes Relative 22 %   Lymphs Abs 2.5 0.7 - 4.0 K/uL   Monocytes Relative 9 %   Monocytes Absolute 1.1 (H) 0.1 - 1.0 K/uL   Eosinophils Relative 1 %   Eosinophils Absolute 0.1 0.0 - 0.5 K/uL   Basophils Relative 1 %   Basophils Absolute 0.1 0.0 - 0.1 K/uL   Immature Granulocytes 0 %   Abs Immature Granulocytes 0.05 0.00 - 0.07 K/uL    Comment: Performed at [facility], [address] [place], [place] [address]  Basic metabolic panel     Status: Abnormal   Collection Time: 01/26/21  4:09 AM  Result Value Ref Range   Sodium 140 135 - 145 mmol/L   Potassium 3.7 3.5 - 5.1 mmol/L   Chloride 106 98 - 111 mmol/L   CO2 22 22 - 32 mmol/L   Glucose, Bld 113 (H) 70 - 99 mg/dL    Comment: Glucose reference range applies only to samples taken after fasting for at least 8 hours.   BUN 20 6 - 20 mg/dL   Creatinine, Ser 1.15 (H) 0.44 - 1.00 mg/dL   Calcium 9.3 8.9 - 10.3 mg/dL   GFR,  Estimated 56 (L) >60 mL/min    Comment: (NOTE) Calculated using the CKD-EPI Creatinine Equation (2021)    Anion gap 12 5 - 15    Comment: Performed at [facility], [address] [place], [place] [address]  Protime-INR     Status: None   Collection Time: 01/26/21  4:09 AM  Result Value Ref Range   Prothrombin Time 14.1 11.4 - 15.2 seconds   INR 1.1 0.8 - 1.2    Comment: (NOTE) INR goal varies based on device and disease states. Performed at [facility], [address] [place], [place] [address]   Hemoglobin A1c     Status: Abnormal   Collection Time: 01/26/21  4:10 AM  Result Value Ref Range   Hgb A1c MFr Bld 5.7 (H) 4.8 - 5.6 %    Comment: (NOTE) Pre diabetes:          5.7%-6.4%  Diabetes:              >6.4%  Glycemic control for   <7.0% adults with diabetes    Mean Plasma Glucose 116.89 mg/dL    Comment: Performed at [facility] [address] [place],  [address]  Lipid panel     Status: Abnormal   Collection Time: 01/26/21  4:10 AM  Result Value Ref Range   Cholesterol 137 0 - 200 mg/dL  Triglycerides 163 (H) <150 mg/dL   HDL 50 >40 mg/dL   Total CHOL/HDL Ratio 2.7 RATIO   VLDL 33 0 - 40 mg/dL   LDL Cholesterol 54 0 - 99 mg/dL    Comment:        Total Cholesterol/HDL:CHD Risk Coronary Heart Disease Risk Table                     Men   Women  1/2 Average Risk   3.4   3.3  Average Risk       5.0   4.4  2 X Average Risk   9.6   7.1  3 X Average Risk  23.4   11.0        Use the calculated Patient Ratio above and the CHD Risk Table to determine the patient's CHD Risk.        ATP III CLASSIFICATION (LDL):  <100     mg/dL   Optimal  100-129  mg/dL   Near or Above                    Optimal  130-159  mg/dL   Borderline  160-189  mg/dL   High  >190     mg/dL   Very High Performed at [facility], [address], [place], [place] [address]   HIV Antibody (routine testing w rflx)     Status: None   Collection Time: 01/26/21  4:10 AM  Result  Value Ref Range   HIV Screen 4th Generation wRfx Non Reactive Non Reactive    Comment: Performed at [facility], [place] [address], [place], [place] [address]  Glucose, capillary     Status: Abnormal   Collection Time: 01/26/21  5:50 PM  Result Value Ref Range   Glucose-Capillary 147 (H) 70 - 99 mg/dL    Comment: Glucose reference range applies only to samples taken after fasting for at least 8 hours.  Glucose, capillary     Status: Abnormal   Collection Time: 01/27/21 12:45 AM  Result Value Ref Range   Glucose-Capillary 155 (H) 70 - 99 mg/dL    Comment: Glucose reference range applies only to samples taken after fasting for at least 8 hours.  TSH     Status: Abnormal   Collection Time: 01/27/21  4:48 AM  Result Value Ref Range   TSH 0.348 (L) 0.350 - 4.500 uIU/mL    Comment: Performed by a 3rd Generation assay with a functional sensitivity of <=0.01 uIU/mL. Performed at [facility], [address] [place], [place] [address]   Ammonia     Status: None   Collection Time: 01/27/21  4:48 AM  Result Value Ref Range   Ammonia 31 9 - 35 umol/L    Comment: Performed at [facility], [address] [place], [place] [address]  Basic metabolic panel     Status: Abnormal   Collection Time: 01/27/21  4:48 AM  Result Value Ref Range   Sodium 137 135 - 145 mmol/L   Potassium 3.8 3.5 - 5.1 mmol/L   Chloride 104 98 - 111 mmol/L   CO2 23 22 - 32 mmol/L   Glucose, Bld 122 (H) 70 - 99 mg/dL    Comment: Glucose reference range applies only to samples taken after fasting for at least 8 hours.   BUN 33 (H) 6 - 20 mg/dL   Creatinine, Ser 1.29 (H) 0.44 - 1.00 mg/dL   Calcium 8.7 (L) 8.9 -  10.3 mg/dL   GFR, Estimated 49 (L) >60 mL/min    Comment: (NOTE) Calculated using the CKD-EPI Creatinine Equation (2021)    Anion gap 10 5 - 15    Comment: Performed at [facility], [address], [place], [place] [address]  Glucose, capillary     Status: Abnormal   Collection Time: 01/27/21  6:04 AM   Result Value Ref Range   Glucose-Capillary 141 (H) 70 - 99 mg/dL    Comment: Glucose reference range applies only to samples taken after fasting for at least 8 hours.  Vitamin B12     Status: None   Collection Time: 01/27/21  9:58 AM  Result Value Ref Range   Vitamin B-12 415 180 - 914 pg/mL    Comment: (NOTE) This assay is not validated for testing neonatal or myeloproliferative syndrome specimens for Vitamin B12 levels. Performed at [facility], [address], [place], [place] [address]   Glucose, capillary     Status: Abnormal   Collection Time: 01/27/21 11:06 AM  Result Value Ref Range   Glucose-Capillary 169 (H) 70 - 99 mg/dL    Comment: Glucose reference range applies only to samples taken after fasting for at least 8 hours.  Glucose, capillary     Status: Abnormal   Collection Time: 01/27/21  4:39 PM  Result Value Ref Range   Glucose-Capillary 122 (H) 70 - 99 mg/dL    Comment: Glucose reference range applies only to samples taken after fasting for at least 8 hours.  Urinalysis, Routine w reflex microscopic Urine, Catheterized     Status: Abnormal   Collection Time: 01/27/21  6:30 PM  Result Value Ref Range   Color, Urine YELLOW YELLOW   APPearance CLEAR CLEAR   Specific Gravity, Urine 1.020 1.005 - 1.030   pH 6.0 5.0 - 8.0   Glucose, UA NEGATIVE NEGATIVE mg/dL   Hgb urine dipstick SMALL (A) NEGATIVE   Bilirubin Urine NEGATIVE NEGATIVE   Ketones, ur NEGATIVE NEGATIVE mg/dL   Protein, ur 30 (A) NEGATIVE mg/dL   Nitrite NEGATIVE NEGATIVE   Leukocytes,Ua NEGATIVE NEGATIVE   RBC / HPF 0-5 0 - 5 RBC/hpf   WBC, UA 0-5 0 - 5 WBC/hpf   Bacteria, UA NONE SEEN NONE SEEN    Comment: Performed at [facility], [address], [place], [place] [address]    Studies/Results:   BRAIN MRI WO  01-2021 FINDINGS: Brain: Newly seen punctate acute or subacute infarction in the lower left paramedian mid brain. No other acute finding. The brainstem and cerebellum appear otherwise  normal. Cerebral hemispheres show a few punctate foci of T2 and FLAIR signal within the deep and subcortical white matter, often seen at this age. No cortical or large vessel territory infarction. No mass lesion, hemorrhage, hydrocephalus or extra-axial collection.  Vascular: Major vessels at the base of the brain show flow.  Skull and upper cervical spine: Negative  Sinuses/Orbits: Clear/normal  Other: None  IMPRESSION: Subtle evidence of a punctate acute infarction in the lower left paramedian mid brain. No finding was present in this location on the study of 11/14/2020. The remainder the brain is essentially normal for age, with a few punctate foci of T2 and FLAIR signal within the cerebral hemispheric white matter as seen previously.      CT C SPINE HEAD  IMPRESSION: 1. No acute intracranial abnormalities identified. 2. Evaluation of the cervical spine is mildly limited by motion. Within these limitations, no fracture or traumatic malalignment Identified.  BRAIN MRI/MRA NECK MRA 11-2020 IMPRESSION: 1. No acute intracranial abnormality. 2. Minimal cerebral white matter T2 signal changes, slightly progressed from 2017 and nonspecific with considerations including chronic small vessel ischemia, migraines, and prior infection/inflammation. No specific findings to suggest demyelinating disease. 3. Negative head MRA. 4. Negative neck MRA.   TTE 01-2021 1. Left ventricular ejection fraction, by estimation, is 55 to 60%. The left ventricle has normal function. The left ventricle has no regional wall motion abnormalities. There is mild left ventricular hypertrophy. 2. Right ventricular systolic function is normal. The right ventricular size is normal. 3. The mitral valve is normal in structure. No evidence of mitral valve regurgitation. 4. Aortic valve regurgitation is not visualized. Mild aortic valve sclerosis is present, with no evidence of aortic valve  stenosis. 5. The inferior vena cava is normal in size with greater than 50% respiratory variability, suggesting right atrial pressure of 3 mmHg. Comparison(s): The left ventricular function is unchanged.   TTE 10-2020 1. Left ventricular ejection fraction, by estimation, is 55 to 60%. The  left ventricle has normal function. The left ventricle has no regional  wall motion abnormalities. Left ventricular diastolic parameters are  consistent with Grade I diastolic  dysfunction (impaired relaxation).  2. Right ventricular systolic function is normal. The right ventricular  size is normal. There is normal pulmonary artery systolic pressure. The  estimated right ventricular systolic pressure is 76.8 mmHg.  3. Left atrial size was mildly dilated.  4. The mitral valve is normal in structure. Trivial mitral valve  regurgitation. No evidence of mitral stenosis.  5. The aortic valve is tricuspid. Aortic valve regurgitation is not  visualized. Mild aortic valve sclerosis is present, with no evidence of  aortic valve stenosis.  6. The inferior vena cava is normal in size with greater than 50%  respiratory variability, suggesting right atrial pressure of 3 mmHg.    The brain MRI is reviewed in person. There is questionable of faint increased still involving the left midbrain of questionable significance. There is minimal chronic changes seen on FLAIR imaging involving the white matter. No encephalomalacia is noted. No hemorrhages noted. The scan this mostly unrevealing.    Meril Dray A. Merlene Laughter, M.D.  Diplomate, Tax adviser of Psychiatry and Neurology ( Neurology). 01/27/2021, 7:34 PM

## 2021-01-27 NOTE — Progress Notes (Signed)
Vital signs and stroke assessment completed. Patient was grimacing. Asked her if she wanted something for pain and she was able to let me know she did. I gave her the small oxycodone. She was able to take them one at a time. She has to be repositioned; she can not move on her own. She has not yet voided on her own. Her legs and arms are flaccid. When she is alert, she can answer questions appropriately in small sentences.

## 2021-01-27 NOTE — Progress Notes (Signed)
CHG bath was not given. There is no order for surgery this admission.

## 2021-01-27 NOTE — Evaluation (Signed)
Speech Language Pathology Evaluation Patient Details Name: MEIYA WISLER MRN: [id number] DOB: Oct 06, 1965 Today's Date: 01/27/2021 Time: 6568-1275 SLP Time Calculation (min) (ACUTE ONLY): 31 min  Problem List:  Patient Active Problem List   Diagnosis Date Noted  . Acute CVA/punctate acute or subacute infarction in the lower Lt Paramedian Mid Brain 01/26/2021  . Acute metabolic encephalopathy 17/00/1749  . Aphasia 01/25/2021  . Multiple falls 01/25/2021  . Genetic testing 12/31/2019  . AKI (acute kidney injury) ([place]) 08/06/2019  . Emesis, persistent 08/06/2019  . Viral syndrome 08/05/2019  . Tobacco abuse   . History of colonic polyps 07/23/2019  . Family history of breast cancer   . Family history of colon cancer   . Synovial cyst of lumbar spine 12/20/2018  . Degeneration of lumbar intervertebral disc 12/01/2018  . Myositis 05/11/2016  . Anxiety 05/26/2015  . Tremors of nervous system 05/26/2015  . Neck pain 05/26/2015  . Hot flushes, perimenopausal 07/24/2013  . CAD (coronary artery disease) 04/11/2012  . GERD (gastroesophageal reflux disease) 04/11/2012  . Addison's disease ([place]) 03/03/2012  . QT prolongation 12/15/2010  . Palpitations 01/01/2009  . Hypothyroidism 12/31/2008  . Hyperlipidemia 12/31/2008  . OVERWEIGHT/OBESITY 12/31/2008  . Essential hypertension 12/31/2008   Past Medical History:  Past Medical History:  Diagnosis Date  . Addison's disease ([place])   . Addison's disease ([place])   . Adrenal insufficiency ([place])    diagnosed 2012  . Aneurysm ([place])   . Anxiety   . Arthritis   . Astigmatism   . CAD (coronary artery disease)    Cath 2008 EF normal. RCA 50-60, Septal 50%. Myoview 3/12: EF 53% normal perfusion  . Cardiac arrest ([place])    2/2 adissonian crisis  . Cardiomyopathy    resolved  . Chest pain    chronicc  . CHF (congestive heart failure) ([place])   . Chronic back pain   . Chronic diarrhea   . Concussion    sept 28th 2014  . Family history of  breast cancer   . Family history of colon cancer   . Gastroparesis   . GERD (gastroesophageal reflux disease)   . HTN (hypertension)   . Hyperlipidemia   . Hypothyroidism   . Mitral valve prolapse   . Nondiabetic gastroparesis   . PONV (postoperative nausea and vomiting)   . QT prolongation   . Tobacco abuse    down to 2 cigarettes per day  . Vertigo    Past Surgical History:  Past Surgical History:  Procedure Laterality Date  . ABDOMINAL HYSTERECTOMY    . CARDIAC CATHETERIZATION  03/2007   showed 60% lesion in the right coronary artery  . CHOLECYSTECTOMY    . LEFT HEART CATHETERIZATION WITH CORONARY ANGIOGRAM N/A 04/13/2012   Procedure: LEFT HEART CATHETERIZATION WITH CORONARY ANGIOGRAM;  Surgeon: Hillary Bow, MD;  Location: [facility] CATH LAB;  Service: Cardiovascular;  Laterality: N/A;  . LUMBAR LAMINECTOMY/DECOMPRESSION MICRODISCECTOMY Left 12/20/2018   Procedure: Left Lumbar Three-Four Lumbar Laminotomy and Foraminotomy, Lumbar Four-Five Laminotomy and Foraminotomy with Microdiscectomy and Resection of Synovial Cyst;  Surgeon: Jovita Gamma, MD;  Location: [facility];  Service: Neurosurgery;  Laterality: Left;  Left Lumbar 3-4 Lumbar laminotomy, foraminotomy, possible microdiscectomy with possible resection of synovial cyst  . SPINE SURGERY    . VARICOSE VEIN SURGERY    . VESICOVAGINAL FISTULA CLOSURE W/ TAH     HPI:  55 y.o. female with a history of hypertension, Addison's disease on chronic steroids, degenerative disc disease with chronic pain  in her neck and her lumbar spine, hypothyroidism, hyperlipidemia, GERD, coronary artery disease.  In some medical records, there is documentation of multiple sclerosis, although there are is an MRI in care everywhere with no evidence of demyelinating disease.  Patient presents with this, word finding difficulties, multiple falls over the past 3 days.  The dizziness and word finding difficulty started 3 days ago and the fall started yesterday.   She attributes the falls to increasing weakness of her legs.  No palliating or provoking factors.  She has hit her head and neck and has had increasing back pain because of the falls.  She does have an impending surgery with Dr. Ronnald Ramp in the near future. MRI head indicated on 01/26/21 Subtle evidence of a punctate acute infarction in the lower left paramedian mid brain.  Assessment / Plan / Recommendation Clinical Impression  Pt administered the SLUMS ([place] Mental Status Examination) with an overall score obtained of 24/30 with 27/30 being a typical score for normal cognitive functioning on this assessment.  Pt demonstrated difficulty with areas of memory, attention, verbal expression and orientation (oriented x3).  Expressive aphasia present within sentences-conversation level and was apparent within organization of categories, conversation and divergent/convergent tasks.  Simple calculation, naming items in a category with a time constraint and visuospatial skills were all specific areas that gave pt difficulty during this assessment.  Recommend ST to f/u in acute setting for above deficits.  Thank you for this consult.    SLP Assessment  SLP Recommendation/Assessment: Patient needs continued Speech Language Pathology Services SLP Visit Diagnosis: Aphasia (R47.01);Cognitive communication deficit (R41.841)    Follow Up Recommendations  Skilled Nursing facility    Frequency and Duration min 2x/week  1 week      SLP Evaluation Cognition  Overall Cognitive Status: Impaired/Different from baseline Arousal/Alertness: Awake/alert Orientation Level: Oriented to person;Oriented to place;Oriented to time;Disoriented to situation Attention: Sustained Sustained Attention: Impaired Sustained Attention Impairment: Verbal basic;Functional basic Memory: Impaired Memory Impairment: Retrieval deficit;Decreased short term memory Decreased Short Term Memory: Verbal complex;Functional  complex Immediate Memory Recall: Sock;Blue;Bed Memory Recall Sock: Without Cue Memory Recall Blue: Without Cue Memory Recall Bed: Without Cue Awareness:  (DTA) Executive Function: Organizing Organizing: Impaired Organizing Impairment: Verbal basic;Functional basic Behaviors: Restless Safety/Judgment: Appears intact       Comprehension  Auditory Comprehension Overall Auditory Comprehension: Appears within functional limits for tasks assessed Conversation: Complex Interfering Components: Pain;Attention EffectiveTechniques: Pausing;Repetition Visual Recognition/Discrimination Discrimination: Not tested Reading Comprehension Reading Status: Not tested    Expression Expression Primary Mode of Expression: Verbal Verbal Expression Overall Verbal Expression: Impaired Level of Generative/Spontaneous Verbalization: Conversation Repetition: No impairment Naming: Impairment Responsive: 76-100% accurate Confrontation: Within functional limits Convergent: 50-74% accurate Divergent: 50-74% accurate Verbal Errors: Perseveration Non-Verbal Means of Communication: Not applicable Written Expression Dominant Hand: Right Written Expression: Not tested   Oral / Motor  Oral Motor/Sensory Function Overall Oral Motor/Sensory Function: Within functional limits Motor Speech Overall Motor Speech: Appears within functional limits for tasks assessed Respiration: Within functional limits Phonation: Normal Resonance: Within functional limits Articulation: Within functional limitis Intelligibility: Intelligible Motor Planning: Witnin functional limits Motor Speech Errors: Not applicable                       Elvina Sidle, M.S., CCC-SLP 01/27/2021, 3:03 PM

## 2021-01-27 NOTE — Progress Notes (Addendum)
Occupational Therapy Treatment Patient Details Name: [name] MRN: [id number] DOB: 11-May-1966 Today's Date: 01/27/2021    History of present illness [name] is a 55 y.o. female with a history of hypertension, Addison's disease on chronic steroids, degenerative disc disease with chronic pain in her neck and her lumbar spine, hypothyroidism, hyperlipidemia, GERD, coronary artery disease.  In some medical records, there is documentation of multiple sclerosis, although there are is an MRI in care everywhere with no evidence of demyelinating disease.  Patient presents with this, word finding difficulties, multiple falls over the past 3 days.  The dizziness and word finding difficulty started 3 days ago and the fall started yesterday.  She attributes the falls to increasing weakness of her legs.  No palliating or provoking factors.  She has hit her head and neck and has had increasing back pain because of the falls.  She does have an impending surgery with Dr. Ronnald Ramp in the near future.  Denies chest pain, shortness of breath, fevers, chills, nausea, vomiting.  No new changes in medications.   OT comments  Pt agreeable to OT treatment this date. Pt presents with improved arousal level and expressive communication. Pt able to report elements of prior living history and function. Pt continues to require maximal assist for supine to sit bed mobility but does show improved A/ROM upon request with near [facility] shoulder flexion in supine with HOB elevated. Continued weakness noted via poor control of UE movement. Pt will continue to benefit from OT services in the hospital setting to address deficit areas for safe ADL's.   Follow Up Recommendations  SNF;Supervision/Assistance - 24 hour    Equipment Recommendations  None recommended by OT          Precautions / Restrictions Precautions Precautions: Fall Restrictions Weight Bearing Restrictions: No       Mobility Bed Mobility Overal bed  mobility: Needs Assistance Bed Mobility: Supine to Sit;Sit to Supine     Supine to sit: Max assist Sit to supine: Max assist   General bed mobility comments: slow labored movement, but good return for using BUE/LE for pulling/pushing self up in bed    Transfers                      Balance Overall balance assessment: Needs assistance Sitting-balance support: Feet supported;Bilateral upper extremity supported Sitting balance-Leahy Scale: Poor Sitting balance - Comments: fair/poor seated at EOB                                   ADL either performed or assessed with clinical judgement   ADL                       Lower Body Dressing: Total assistance;Bed level Lower Body Dressing Details (indicate cue type and reason): total assist to don socks at bed level.                     Vision Baseline Vision/History:  (Pt reports being near sighted but cannot afford to see optometrist.) Patient Visual Report: No change from baseline                Cognition Arousal/Alertness: Awake/alert;Lethargic Behavior During Therapy: Flat affect Overall Cognitive Status: No family/caregiver present to determine baseline cognitive functioning  Exercises                Pertinent Vitals/ Pain       Pain Assessment: 0-10 Pain Score: 8  Pain Location: reported pain in low back and neck; 8/10 pain reported for bladder or groin region. Pt unsure of exact area of pain. Pain Descriptors / Indicators: Grimacing;Sore;Discomfort Pain Intervention(s): Limited activity within patient's tolerance;Monitored during session;Repositioned;Patient requesting pain meds-RN notified  Home Living Family/patient expects to be discharged to:: Private residence Living Arrangements: Children Available Help at Discharge: Family;Personal care attendant;Available PRN/intermittently (Pt reported that a a nurse comes  to the house Monday through Friday for a total of 45 hours but is primarily there to assist the pt's daughter.) Type of Home: Apartment Home Access: Level entry     Home Layout: One level     Bathroom Shower/Tub: Teacher, early years/pre: Standard     Home Equipment: Cane - single point;Walker - 2 wheels;Grab bars - tub/shower;Other (comment) (Reportedly has a lift used for daughter.)          Prior Functioning/Environment Level of Independence: Needs assistance  Gait / Transfers Assistance Needed: Pt reported that she uses a cane and sometimes a RW for household ambulation. Uses RW if increase in pain. Pt uses a scooter when at the store. ADL's / Homemaking Assistance Needed: Pt reported that her neighbor helps with lower body dressing. Pt reported that she would get groceries.   Comments: Pt may be a poor historian. Able to report more informatioin this date but family should confirm set up and previous level of assist.   Frequency  Min 2X/week        Progress Toward Goals  OT Goals(current goals can now be found in the care plan section)  Progress towards OT goals: Progressing toward goals  Acute Rehab OT Goals Patient Stated Goal: return home OT Goal Formulation: Patient unable to participate in goal setting Time For Goal Achievement: 02/09/21 Potential to Achieve Goals: Fair ADL Goals Pt Will Perform Eating: with mod assist;sitting;bed level Pt Will Perform Grooming: with mod assist;with min assist;standing;sitting;bed level Pt Will Perform Upper Body Dressing: with min assist;with mod assist;bed level;sitting Pt Will Perform Lower Body Dressing: with mod assist;with min assist;sitting/lateral leans;with adaptive equipment Pt Will Transfer to Toilet: with mod assist;with min assist;grab bars;stand pivot transfer;bedside commode Pt/caregiver will Perform Home Exercise Program: Increased ROM;Increased strength;Both right and left upper extremity;With  minimal assist  Plan Discharge plan remains appropriate                                    End of Session    OT Visit Diagnosis: Unsteadiness on feet (R26.81);Other abnormalities of gait and mobility (R26.89);Repeated falls (R29.6);Muscle weakness (generalized) (M62.81);History of falling (Z91.81);Cognitive communication deficit (R41.841)   Activity Tolerance Patient tolerated treatment well   Patient Left in bed;with call bell/phone within reach;with bed alarm set   Nurse Communication Patient requests pain meds        Time: 8101-7510 OT Time Calculation (min): 27 min  Charges: OT General Charges $OT Visit: 1 Visit OT Treatments $Self Care/Home Management : 23-37 mins  Okechukwu Regnier OT, MOT    Larey Seat 01/27/2021, 11:50 AM

## 2021-01-27 NOTE — Progress Notes (Signed)
Physical Therapy Treatment Patient Details Name: [name] MRN: [id number] DOB: [birth year] Today's Date: 01/27/2021    History of Present Illness [name] is a 55 y.o. female with a history of hypertension, Addison's disease on chronic steroids, degenerative disc disease with chronic pain in her neck and her lumbar spine, hypothyroidism, hyperlipidemia, GERD, coronary artery disease.  In some medical records, there is documentation of multiple sclerosis, although there are is an MRI in care everywhere with no evidence of demyelinating disease.  Patient presents with this, word finding difficulties, multiple falls over the past 3 days.  The dizziness and word finding difficulty started 3 days ago and the fall started yesterday.  She attributes the falls to increasing weakness of her legs.  No palliating or provoking factors.  She has hit her head and neck and has had increasing back pain because of the falls.  She does have an impending surgery with Dr. Ronnald Ramp in the near future.  Denies chest pain, shortness of breath, fevers, chills, nausea, vomiting.  No new changes in medications.    PT Comments    Patient demonstrates slow labored movement for sitting up at bedside, able to maintain sitting balance for up to 2-3 minutes before falling backwards due to weakness, unable to stand with poor carryover for holding onto RW due to poor hand grip strength.  Patient demonstrates fair/good return for using BUE/LE for helping to reposition self when put back to bed.  Patient will benefit from continued physical therapy in hospital and recommended venue below to increase strength, balance, endurance for safe ADLs and gait.    Follow Up Recommendations  SNF     Equipment Recommendations  Rolling walker with 5" wheels    Recommendations for Other Services       Precautions / Restrictions Precautions Precautions: Fall Restrictions Weight Bearing Restrictions: No    Mobility  Bed  Mobility Overal bed mobility: Needs Assistance Bed Mobility: Supine to Sit;Sit to Supine     Supine to sit: Max assist Sit to supine: Max assist   General bed mobility comments: slow labored movement, but good return for using BUE/LE for pulling/pushing self up in bed    Transfers                    Ambulation/Gait                 Stairs             Wheelchair Mobility    Modified Rankin (Stroke Patients Only)       Balance Overall balance assessment: Needs assistance Sitting-balance support: Feet supported;Bilateral upper extremity supported Sitting balance-Leahy Scale: Poor Sitting balance - Comments: fair/poor seated at EOB                                    Cognition Arousal/Alertness: Awake/alert;Lethargic Behavior During Therapy: Flat affect Overall Cognitive Status: No family/caregiver present to determine baseline cognitive functioning                                        Exercises General Exercises - Lower Extremity Long Arc Quad: Seated;AROM;AAROM;Strengthening;5 reps;Both Hip Flexion/Marching: Seated;AROM;AAROM;Strengthening;Both;5 reps    General Comments        Pertinent Vitals/Pain Pain Assessment: No/denies pain    Home Living  Prior Function            PT Goals (current goals can now be found in the care plan section) Acute Rehab PT Goals Patient Stated Goal: return home PT Goal Formulation: With patient Time For Goal Achievement: 02/10/21 Potential to Achieve Goals: Good Progress towards PT goals: Progressing toward goals    Frequency    Min 3X/week      PT Plan Current plan remains appropriate    Co-evaluation              AM-PAC PT "6 Clicks" Mobility   Outcome Measure    Help needed moving from lying on your back to sitting on the side of a flat bed without using bedrails?: A Lot Help needed moving to and from a bed to a chair  (including a wheelchair)?: A Lot Help needed standing up from a chair using your arms (e.g., wheelchair or bedside chair)?: Total Help needed to walk in hospital room?: Total Help needed climbing 3-5 steps with a railing? : Total 6 Click Score: 7    End of Session   Activity Tolerance: Patient tolerated treatment well;Patient limited by fatigue;Patient limited by lethargy Patient left: in bed Nurse Communication: Mobility status PT Visit Diagnosis: Unsteadiness on feet (R26.81);Other abnormalities of gait and mobility (R26.89);Muscle weakness (generalized) (M62.81)     Time: 6073-7106 PT Time Calculation (min) (ACUTE ONLY): 21 min  Charges:  $Therapeutic Activity: 8-22 mins                     11:41 AM, 01/27/21 Lonell Grandchild, MPT Physical Therapist with [facility] [phone] office [phone] mobile phone

## 2021-01-27 NOTE — Progress Notes (Signed)
EEG Completed; Results Pending  

## 2021-01-28 ENCOUNTER — Inpatient Hospital Stay (HOSPITAL_COMMUNITY): Payer: Medicare HMO

## 2021-01-28 DIAGNOSIS — G9341 Metabolic encephalopathy: Secondary | ICD-10-CM

## 2021-01-28 DIAGNOSIS — I10 Essential (primary) hypertension: Secondary | ICD-10-CM | POA: Diagnosis not present

## 2021-01-28 DIAGNOSIS — R4182 Altered mental status, unspecified: Secondary | ICD-10-CM

## 2021-01-28 LAB — RPR: RPR Ser Ql: NONREACTIVE

## 2021-01-28 LAB — GLUCOSE, CAPILLARY
Glucose-Capillary: 142 mg/dL — ABNORMAL HIGH (ref 70–99)
Glucose-Capillary: 147 mg/dL — ABNORMAL HIGH (ref 70–99)

## 2021-01-28 NOTE — Progress Notes (Signed)
PT Cancellation Note  Patient Details Name: [name] MRN: [id number] DOB: [birth year]   Cancelled Treatment:    Reason Eval/Treat Not Completed: Patient at procedure or test/unavailable (MRI) Attempted therapy, pt preparing to be taken down for MRI.  Will attempt therapy later is available.  Ihor Austin, LPTA/CLT; CBIS [phone]  Aldona Lento 01/28/2021, 8:46 AM

## 2021-01-28 NOTE — Progress Notes (Signed)
Patient called out to the desk stating that her blood was clotting in her arm after "they took blood" from her arm.

## 2021-01-28 NOTE — TOC Progression Note (Signed)
Transition of Care [facility]) - Progression Note    Patient Details  Name: [name] MRN: [id number] Date of Birth: 29-Apr-1966  Transition of Care [facility]) CM/SW Contact  Natasha Bence, LCSW Phone Number: 01/28/2021, 4:22 PM  Clinical Narrative:    CSW contacted Debbie with [facility] to inquire about out. Debbie reported that patient has not received auth yet. TOC signing off.   Expected Discharge Plan: [place] Barriers to Discharge: Insurance Authorization  Expected Discharge Plan and Services Expected Discharge Plan: [place] In-house Referral: NA Discharge Planning Services: CM Consult Post Acute Care Choice: [place] arrangements for the past 2 months: Single Family Home                 DME Arranged: N/A DME Agency: NA       HH Arranged: NA HH Agency: NA         Social Determinants of Health (SDOH) Interventions    Readmission Risk Interventions No flowsheet data found.

## 2021-01-28 NOTE — Procedures (Addendum)
Patient Name: [name]  MRN: [id number]  Epilepsy Attending: Lora Havens  Referring Physician/Provider: Dr Roxan Hockey Date: 01/27/2021  Duration: 23.30 mins  Patient history: 55 year old female with altered mental status.  EEG to evaluate for seizures.  Level of alertness: Awake  AEDs during EEG study: Topiramate, zonisamide, gabapentin  Technical aspects: This EEG study was done with scalp electrodes positioned according to the 10-20 International system of electrode placement. Electrical activity was acquired at a sampling rate of 500Hz  and reviewed with a high frequency filter of 70Hz  and a low frequency filter of 1Hz . EEG data were recorded continuously and digitally stored.   Description: The posterior dominant rhythm consists of 9-10 Hz activity of moderate voltage (25-35 uV) seen predominantly in posterior head regions, symmetric and reactive to eye opening and eye closing. Hyperventilation and photic stimulation were not performed.     IMPRESSION: This study is within normal limits. No seizures or epileptiform discharges were seen throughout the recording.  [name] Barbra Sarks

## 2021-01-28 NOTE — Progress Notes (Signed)
Patient Demographics:    [name], is a 55 y.o. female, DOB - April 29, 1966, ZJQ:[id number]  Admit date - 01/25/2021   Admitting Physician Courage Denton Brick, MD  Outpatient Primary MD for the patient is Redmond School, MD  LOS - 2   Chief Complaint  Patient presents with  . Dizziness        Subjective:    [name] was seen and examined this morning, delusional stating the nursing outside is talking about her abusing pain medication, fentanyl.... Otherwise hemodynamically stable, minimal residual finding of stroke   Assessment  & Plan :    Principal Problem:   Acute CVA/punctate acute or subacute infarction in the lower Lt Paramedian Mid Brain Active Problems:   Hypothyroidism   Essential hypertension   QT prolongation   Addison's disease (HCC)   CAD (coronary artery disease)   GERD (gastroesophageal reflux disease)   Aphasia   Degeneration of lumbar intervertebral disc   Multiple falls   Acute metabolic encephalopathy  Brief Summary: 55 year old reformed smoker with past medical history relevant for Addison's disease, hypothyroidism, HTN, CAD, GERD and chronic back pain on chronic opiates admitted on 01/25/2021 with recurrent falls and aphasia and found to have acute stroke  A/p   1)Acute CVA/punctate acute or subacute infarction in the lower Lt Paramedian Mid Brain -CT head and brain MRI noted -EEG suggesting left temporal epileptic form discharges...  -Neurology seen and evaluated the patient, recommending antiplatelet mono therapy Regarding EEG findings recommending antiseizure medication Topamax 25 mg p.o. twice daily -PTA patient was on aspirin 81 mg daily, ?  Aspirin failure so give Aspirin 81 mg daily along with Plavix 75 mg daily for 30 days then after that STOP the aspirin and continue ONLY Plavix 75 mg daily indefinitely--for secondary stroke prevention  -PTA patient was on  Lipitor 20 mg daily increased to 40 mg daily -PT/OT and speech pathologist eval appreciated---recommends SNF -Allow permissive hypertension-discontinued amlodipine -Patient had MRA head and MRA neck on November 14, 2020 without LVO -Echo with preserved EF of 55 to 60% without wall motion abnormalities or significant valvular problems -Watch for arrhythmias on telemetry unit -A1c is 5.7,  -LDL is 54 with HDL of 50---Even if her lipid panel is within desired limits, patient should still take Lipitor for it's Pleiotropic effects (beyond cholesterol lowering benefits)  2) Aphasia/dysphagia--speech pathology eval appreciated  3) chronic back pain--- patient has had numerous injections and procedures and prior surgeries for neck and lower back pain currently on opiates chronically  -patient was scheduled for surgery on 02/04/2021 this will have to be postponed for now given acute stroke and need for Plavix -Patient has urinary retention and worsening back pain we will get lumbar MRI this admission -given recent falls -Be judicious with opiates and muscle relaxants  4) Addison's disease--- awake, able to take oral intake will transition back to oral hydrocortisone at 20 mg twice daily  5)GERD-Protonix as ordered  6)Hypothyroidism--- continue levothyroxine  7) acute metabolic encephalopathy -UA and chest x-ray without evidence of infection -UDS with opiates, benzos and THC, patient is prescribed opiates and benzos -Altered mentation was most likely stroke related, awareness and mental status has improved significantly over the last 24 hours  8)Social/Ethics--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 -Plan of care discussed with patient's son [name] and patient's Sister [name],  - Patient is a full code  9) urinary retention--- in and out catheterization with over 900 mL of urine, give Flomax, -Lumbar MRI as above #3  Disposition/Need for in-Hospital Stay- patient  unable to be discharged at this time due to -acute CVA --- awaiting insurance authorization to go to [facility] re[facility]   Status is: Inpatient  Remains inpatient appropriate because:please see disposition above   Disposition: The patient is from: Home              Anticipated d/c is to: SNF              Anticipated d/c date is: 2 days--              Patient currently is medically stable to d/c. Barriers: Awaiting insurance authorization  Code Status :  -  Code Status: Full Code   Family Communication:  discussed with patient's son [name] and patient's Sister [name], Consults  :  Neuro requested  DVT Prophylaxis  :   - SCDs  SCD's Start: 01/26/21 0126 enoxaparin (LOVENOX) injection 40 mg Start: 01/25/21 2100   Lab Results  Component Value Date   PLT 348 01/26/2021    Inpatient Medications  Scheduled Meds: . aspirin  81 mg Oral Daily  . atorvastatin  40 mg Oral Daily  . carvedilol  3.125 mg Oral BID WC  . clopidogrel  75 mg Oral Daily  . enoxaparin (LOVENOX) injection  40 mg Subcutaneous Q24H  . gabapentin  600 mg Oral QID  . hydrocortisone  20 mg Oral BID  . levothyroxine  75 mcg Oral QAC breakfast  . polyethylene glycol  17 g Oral Daily  . senna-docusate  2 tablet Oral QHS  . tamsulosin  0.4 mg Oral QPC supper  . topiramate  25 mg Oral BID  . zolpidem  5 mg Oral QHS   Continuous Infusions:  PRN Meds:.acetaminophen **OR** acetaminophen (TYLENOL) oral liquid 160 mg/5 mL **OR** acetaminophen, meclizine, oxyCODONE    Anti-infectives (From admission, onward)   Start     Dose/Rate Route Frequency Ordered Stop   01/26/21 0600  vancomycin (VANCOCIN) IVPB 1000 mg/200 mL premix  Status:  Discontinued        1,000 mg 200 mL/hr over 60 Minutes Intravenous On call to O.R. 01/26/21 0125 01/26/21 1815        Objective:   Vitals:   01/27/21 1827 01/27/21 2022 01/28/21 0404 01/28/21 1422  BP: (!) 163/87 140/73 129/69 (!) 159/87  Pulse: 72 76 74 80  Resp:  16 16 20    Temp: 99.5 F (37.5 C) 98.7 F (37.1 C) 98.4 F (36.9 C) 98.5 F (36.9 C)  TempSrc: Oral Oral Oral Oral  SpO2: 97% 94% 95% 97%  Weight:        Wt Readings from Last 3 Encounters:  01/25/21 81 kg  11/13/20 80.7 kg  10/21/20 80.7 kg     Intake/Output Summary (Last 24 hours) at 01/28/2021 1613 Last data filed at 01/28/2021 1330 Gross per 24 hour  Intake 1485.17 ml  Output 2150 ml  Net -664.83 ml      Physical Exam:   General:  Alert, oriented, cooperative, no distress; mildly delusional  HEENT:  Normocephalic, PERRL, otherwise with in Normal limits   Neuro:   Flat affect,  negative any slurred speech-mostly expressive aphasia  CNII-XII intact. , normal motor and sensation,  reflexes intact   Lungs:   Clear to auscultation BL, Respirations unlabored, no wheezes / crackles  Cardio:    S1/S2, RRR, No murmure, No Rubs or Gallops   Abdomen:   Soft, non-tender, bowel sounds active all four quadrants,  no guarding or peritoneal signs.  Muscular skeletal  Minimal right-sided weakness Limited exam - in bed, able to move all 4 extremities, Normal strength,  2+ pulses,  symmetric, No pitting edema  Skin:  Dry, warm to touch, negative for any Rashes,  Wounds: Please see nursing documentation            Data Review:   Micro Results Recent Results (from the past 240 hour(s))  Resp Panel by RT-PCR (Flu A&B, Covid) Nasopharyngeal Swab     Status: None   Collection Time: 01/25/21  3:40 PM   Specimen: Nasopharyngeal Swab; Nasopharyngeal(NP) swabs in vial transport medium  Result Value Ref Range Status   SARS Coronavirus 2 by RT PCR NEGATIVE NEGATIVE Final    Comment: (NOTE) SARS-CoV-2 target nucleic acids are NOT DETECTED.  The SARS-CoV-2 RNA is generally detectable in upper respiratory specimens during the acute phase of infection. The lowest concentration of SARS-CoV-2 viral copies this assay can detect is 138 copies/mL. A negative result does not preclude  SARS-Cov-2 infection and should not be used as the sole basis for treatment or other patient management decisions. A negative result may occur with  improper specimen collection/handling, submission of specimen other than nasopharyngeal swab, presence of viral mutation(s) within the areas targeted by this assay, and inadequate number of viral copies(<138 copies/mL). A negative result must be combined with clinical observations, patient history, and epidemiological information. The expected result is Negative.  Fact Sheet for Patients:  EntrepreneurPulse.com.au  Fact Sheet for Healthcare Providers:  IncredibleEmployment.be  This test is no t yet approved or cleared by the [place] FDA and  has been authorized for detection and/or diagnosis of SARS-CoV-2 by FDA under an Emergency Use Authorization (EUA). This EUA will remain  in effect (meaning this test can be used) for the duration of the COVID-19 declaration under Section 564(b)(1) of the Act, 21 U.S.C.section 360bbb-3(b)(1), unless the authorization is terminated  or revoked sooner.       Influenza A by PCR NEGATIVE NEGATIVE Final   Influenza B by PCR NEGATIVE NEGATIVE Final    Comment: (NOTE) The Xpert Xpress SARS-CoV-2/FLU/RSV plus assay is intended as an aid in the diagnosis of influenza from Nasopharyngeal swab specimens and should not be used as a sole basis for treatment. Nasal washings and aspirates are unacceptable for Xpert Xpress SARS-CoV-2/FLU/RSV testing.  Fact Sheet for Patients: EntrepreneurPulse.com.au  Fact Sheet for Healthcare Providers: IncredibleEmployment.be  This test is not yet approved or cleared by the [place] FDA and has been authorized for detection and/or diagnosis of SARS-CoV-2 by FDA under an Emergency Use Authorization (EUA). This EUA will remain in effect (meaning this test can be used) for the duration of  the COVID-19 declaration under Section 564(b)(1) of the Act, 21 U.S.C. section 360bbb-3(b)(1), unless the authorization is terminated or revoked.  Performed at [facility], [address] [place], [place] [address]     Radiology Reports CT ABDOMEN PELVIS WO CONTRAST  Result Date: 01/25/2021 CLINICAL DATA:  Fall.  Abdominal trauma. EXAM: CT ABDOMEN AND PELVIS WITHOUT CONTRAST TECHNIQUE: Multidetector CT imaging of the abdomen and pelvis was performed following the standard protocol without IV contrast. COMPARISON:  10/24/2019 FINDINGS: Lower chest: No acute abnormality. Hepatobiliary: Prior  cholecystectomy. No focal hepatic abnormality or evidence of perihepatic hematoma. Pancreas: No focal abnormality or ductal dilatation. Spleen: No splenic injury or perisplenic hematoma. Adrenals/Urinary Tract: No adrenal hemorrhage or renal injury identified. Bladder is unremarkable. Stomach/Bowel: Moderate stool burden throughout the colon. Stomach, large and small bowel grossly unremarkable. Normal appendix. Vascular/Lymphatic: Aortic atherosclerosis. No evidence of aneurysm or adenopathy. Reproductive: Prior hysterectomy.  No adnexal masses. Other: No free fluid or free air. Musculoskeletal: No acute bony abnormality. IMPRESSION: No acute findings in the abdomen or pelvis. Aortic atherosclerosis. Moderate stool burden. Electronically Signed   By: Rolm Baptise M.D.   On: 01/25/2021 14:46   Chest 2 View  Result Date: 01/26/2021 CLINICAL DATA:  Dizziness and weakness EXAM: CHEST - 2 VIEW COMPARISON:  01/10/2020 FINDINGS: The heart size and mediastinal contours are within normal limits. Both lungs are clear. The visualized skeletal structures are unremarkable. IMPRESSION: No active cardiopulmonary disease. Electronically Signed   By: Ulyses Jarred M.D.   On: 01/26/2021 02:31   CT HEAD WO CONTRAST  Result Date: 01/25/2021 CLINICAL DATA:  Neuro deficit. Acute stroke suspected. Dizziness and weakness. Ten falls  yesterday. Pain. EXAM: CT HEAD WITHOUT CONTRAST CT CERVICAL SPINE WITHOUT CONTRAST TECHNIQUE: Multidetector CT imaging of the head and cervical spine was performed following the standard protocol without intravenous contrast. Multiplanar CT image reconstructions of the cervical spine were also generated. COMPARISON:  None. FINDINGS: CT HEAD FINDINGS Brain: No subdural, epidural, or subarachnoid hemorrhage. Cerebellum, brainstem, and basal cisterns are normal. Ventricles and sulci are unremarkable. No mass effect or midline shift. No acute cortical ischemia or infarct. Vascular: No hyperdense vessel or unexpected calcification. Skull: Normal. Negative for fracture or focal lesion. Sinuses/Orbits: No acute finding. Other: None. CT CERVICAL SPINE FINDINGS Alignment: Evaluation is somewhat limited due to motion. Within the limitations of motion, no traumatic malalignment is identified. Skull base and vertebrae: No acute fracture. No primary bone lesion or focal pathologic process. Soft tissues and spinal canal: No prevertebral fluid or swelling. No visible canal hematoma. Disc levels: Patient is status post anterior plate screw fusion C5 through C7. Hardware is in good position. Mild degenerative disc disease with small anterior and posterior osteophytes. Upper chest: Negative. Other: No other abnormalities. IMPRESSION: 1. No acute intracranial abnormalities identified. 2. Evaluation of the cervical spine is mildly limited by motion. Within these limitations, no fracture or traumatic malalignment identified. Electronically Signed   By: Dorise Bullion III M.D   On: 01/25/2021 14:56   CT Cervical Spine Wo Contrast  Result Date: 01/25/2021 CLINICAL DATA:  Neuro deficit. Acute stroke suspected. Dizziness and weakness. Ten falls yesterday. Pain. EXAM: CT HEAD WITHOUT CONTRAST CT CERVICAL SPINE WITHOUT CONTRAST TECHNIQUE: Multidetector CT imaging of the head and cervical spine was performed following the standard  protocol without intravenous contrast. Multiplanar CT image reconstructions of the cervical spine were also generated. COMPARISON:  None. FINDINGS: CT HEAD FINDINGS Brain: No subdural, epidural, or subarachnoid hemorrhage. Cerebellum, brainstem, and basal cisterns are normal. Ventricles and sulci are unremarkable. No mass effect or midline shift. No acute cortical ischemia or infarct. Vascular: No hyperdense vessel or unexpected calcification. Skull: Normal. Negative for fracture or focal lesion. Sinuses/Orbits: No acute finding. Other: None. CT CERVICAL SPINE FINDINGS Alignment: Evaluation is somewhat limited due to motion. Within the limitations of motion, no traumatic malalignment is identified. Skull base and vertebrae: No acute fracture. No primary bone lesion or focal pathologic process. Soft tissues and spinal canal: No prevertebral fluid or swelling. No visible canal  hematoma. Disc levels: Patient is status post anterior plate screw fusion C5 through C7. Hardware is in good position. Mild degenerative disc disease with small anterior and posterior osteophytes. Upper chest: Negative. Other: No other abnormalities. IMPRESSION: 1. No acute intracranial abnormalities identified. 2. Evaluation of the cervical spine is mildly limited by motion. Within these limitations, no fracture or traumatic malalignment identified. Electronically Signed   By: Dorise Bullion III M.D   On: 01/25/2021 14:56   MR BRAIN WO CONTRAST  Result Date: 01/26/2021 CLINICAL DATA:  Acute stroke presentation. Dizziness and weakness with falling. EXAM: MRI HEAD WITHOUT CONTRAST TECHNIQUE: Multiplanar, multiecho pulse sequences of the brain and surrounding structures were obtained without intravenous contrast. COMPARISON:  Head CT yesterday.  MRI 11/14/2020 FINDINGS: Brain: Newly seen punctate acute or subacute infarction in the lower left paramedian mid brain. No other acute finding. The brainstem and cerebellum appear otherwise normal.  Cerebral hemispheres show a few punctate foci of T2 and FLAIR signal within the deep and subcortical white matter, often seen at this age. No cortical or large vessel territory infarction. No mass lesion, hemorrhage, hydrocephalus or extra-axial collection. Vascular: Major vessels at the base of the brain show flow. Skull and upper cervical spine: Negative Sinuses/Orbits: Clear/normal Other: None IMPRESSION: Subtle evidence of a punctate acute infarction in the lower left paramedian mid brain. No finding was present in this location on the study of 11/14/2020. The remainder the brain is essentially normal for age, with a few punctate foci of T2 and FLAIR signal within the cerebral hemispheric white matter as seen previously. Electronically Signed   By: Nelson Chimes M.D.   On: 01/26/2021 11:26   MR LUMBAR SPINE WO CONTRAST  Result Date: 01/28/2021 CLINICAL DATA:  Low back pain, technologist note states urinary retention and worsening chronic back pain EXAM: MRI LUMBAR SPINE WITHOUT CONTRAST TECHNIQUE: Multiplanar, multisequence MR imaging of the lumbar spine was performed. No intravenous contrast was administered. COMPARISON:  10/20/2020 FINDINGS: Segmentation:  Standard. Alignment:  Anteroposterior alignment is maintained. Vertebrae: Marrow edema associated with L4-L5 facet arthropathy. Vertebral body heights are maintained. No suspicious osseous lesion. Conus medullaris and cauda equina: Conus extends to the L1 level. Conus and cauda equina appear normal. Paraspinal and other soft tissues: Paraspinal edema associated with L4-L5 facet arthropathy. Disc levels: L1-L2:  No stenosis. L2-L3:  No stenosis. L3-L4: Prior left laminectomy. Disc bulge with increased size of left subarticular/foraminal annular fissure and shallow protrusion. Facet arthropathy with residual right ligamentum flavum infolding. No canal stenosis. Unchanged slight narrowing of the subarticular recesses. Unchanged minor right foraminal  stenosis. Slightly increased mild to moderate left foraminal stenosis. L4-L5: Prior left laminectomy. Disc bulge eccentric to the left. Marked facet arthropathy with increased joint effusions. There is a more cystic-appearing 9 mm synovial cyst along the left lateral aspect of the spinal canal. Residual ligamentum flavum infolding. Similar mild canal stenosis. Similar partial effacement the right subarticular recess. Persistent effacement of the left subarticular recess with compression of traversing L5 nerve roots. Similar mild right and moderate left foraminal stenosis. L5-S1: Similar moderate right and mild left facet arthropathy. No canal or foraminal stenosis. IMPRESSION: Degenerative and prior postoperative changes as detailed above. There is no high-grade canal stenosis. Increased facet arthropathy at L4-L5 with marrow and soft tissue edema, fusions, and intraspinal synovial cyst compressing the traversing left L5 nerve roots. Slightly increased disc protrusion/annular fissure and left foraminal stenosis at L3-L4. Electronically Signed   By: Macy Mis M.D.   On: 01/28/2021  12:01   EEG adult  Result Date: 01/28/2021 Lora Havens, MD     01/28/2021  8:41 AM Patient Name: LIAM BOSSMAN MRN: [id number] Epilepsy Attending: Lora Havens Referring Physician/Provider: Dr Roxan Hockey Date: 01/27/2021 Duration: 23.30 mins Patient history: 55 year old female with altered mental status.  EEG to evaluate for seizures. Level of alertness: Awake AEDs during EEG study: Topiramate, zonisamide, gabapentin Technical aspects: This EEG study was done with scalp electrodes positioned according to the 10-20 International system of electrode placement. Electrical activity was acquired at a sampling rate of 500Hz  and reviewed with a high frequency filter of 70Hz  and a low frequency filter of 1Hz . EEG data were recorded continuously and digitally stored. Description: The posterior dominant rhythm consists of 9-10  Hz activity of moderate voltage (25-35 uV) seen predominantly in posterior head regions, symmetric and reactive to eye opening and eye closing. Hyperventilation and photic stimulation were not performed.   IMPRESSION: This study is within normal limits. No seizures or epileptiform discharges were seen throughout the recording. Lora Havens   MM 3D SCREEN BREAST BILATERAL  Result Date: 01/12/2021 CLINICAL DATA:  Screening. EXAM: DIGITAL SCREENING BILATERAL MAMMOGRAM WITH TOMOSYNTHESIS AND CAD TECHNIQUE: Bilateral screening digital craniocaudal and mediolateral oblique mammograms were obtained. Bilateral screening digital breast tomosynthesis was performed. The images were evaluated with computer-aided detection. COMPARISON:  Previous exam(s). ACR Breast Density Category b: There are scattered areas of fibroglandular density. FINDINGS: There are no findings suspicious for malignancy. The images were evaluated with computer-aided detection. IMPRESSION: No mammographic evidence of malignancy. A result letter of this screening mammogram will be mailed directly to the patient. RECOMMENDATION: Screening mammogram in one year. (Code:SM-B-01Y) BI-RADS CATEGORY  1: Negative. Electronically Signed   By: Nolon Nations M.D.   On: 01/12/2021 15:42   ECHOCARDIOGRAM LIMITED  Result Date: 01/26/2021    ECHOCARDIOGRAM LIMITED REPORT   Patient Name:   LORENDA GRECCO Date of Exam: 01/26/2021 Medical Rec #:  [id number]      Height:       65.5 in Accession #:    [id number]     Weight:       178.6 lb Date of Birth:  November 22, 1965       BSA:          1.896 m Patient Age:    19 years       BP:           175/101 mmHg Patient Gender: F              HR:           90 bpm. Exam Location:  [facility] Procedure: Limited Echo Indications:    Stroke I63.9  History:        Patient has prior history of Echocardiogram examinations, most                 recent 10/08/2020. CAD, Signs/Symptoms:Chest Pain; Risk                 Factors:Former Smoker  and Hypertension. GERD, Mitral Valve                 Prolapse.  Sonographer:    Leavy Cella RDCS (AE) Referring Phys: [id number] COURAGE EMOKPAE IMPRESSIONS  1. Left ventricular ejection fraction, by estimation, is 55 to 60%. The left ventricle has normal function. The left ventricle has no regional wall motion abnormalities. There is mild left ventricular hypertrophy.  2. Right ventricular systolic function is  normal. The right ventricular size is normal.  3. The mitral valve is normal in structure. No evidence of mitral valve regurgitation.  4. Aortic valve regurgitation is not visualized. Mild aortic valve sclerosis is present, with no evidence of aortic valve stenosis.  5. The inferior vena cava is normal in size with greater than 50% respiratory variability, suggesting right atrial pressure of 3 mmHg. Comparison(s): The left ventricular function is unchanged. FINDINGS  Left Ventricle: Left ventricular ejection fraction, by estimation, is 55 to 60%. The left ventricle has normal function. The left ventricle has no regional wall motion abnormalities. The left ventricular internal cavity size was normal in size. There is  mild left ventricular hypertrophy. Right Ventricle: The right ventricular size is normal. Right vetricular wall thickness was not assessed. Right ventricular systolic function is normal. Left Atrium: Left atrial size was normal in size. Right Atrium: Right atrial size was normal in size. Pericardium: There is no evidence of pericardial effusion. Mitral Valve: The mitral valve is normal in structure. Tricuspid Valve: The tricuspid valve is normal in structure. Tricuspid valve regurgitation is not demonstrated. Aortic Valve: Aortic valve regurgitation is not visualized. Mild aortic valve sclerosis is present, with no evidence of aortic valve stenosis. Pulmonic Valve: The pulmonic valve was not well visualized. Aorta: The aortic root is normal in size and structure. Venous: The inferior vena cava is  normal in size with greater than 50% respiratory variability, suggesting right atrial pressure of 3 mmHg. LEFT VENTRICLE PLAX 2D LVIDd:         4.01 cm  Diastology LVIDs:         2.88 cm  LV e' medial:    5.34 cm/s LV PW:         1.18 cm  LV E/e' medial:  11.1 LV IVS:        1.31 cm  LV e' lateral:   11.00 cm/s LVOT diam:     1.90 cm  LV E/e' lateral: 5.4 LVOT Area:     2.84 cm  LEFT ATRIUM           Index LA diam:      3.30 cm 1.74 cm/m LA Vol (A4C): 26.0 ml 13.71 ml/m   AORTA Ao Root diam: 2.80 cm MITRAL VALVE MV Area (PHT): 4.21 cm     SHUNTS MV Decel Time: 180 msec     Systemic Diam: 1.90 cm MV E velocity: 59.10 cm/s MV A velocity: 104.00 cm/s MV E/A ratio:  0.57 Dorris Carnes MD Electronically signed by Dorris Carnes MD Signature Date/Time: 01/26/2021/5:30:21 PM    Final      CBC Recent Labs  Lab 01/25/21 1451 01/25/21 1505 01/26/21 0409  WBC 11.5*  --  11.3*  HGB 17.2* 18.4* 16.6*  HCT 53.4* 54.0* 50.8*  PLT 342  --  348  MCV 90.4  --  89.9  MCH 29.1  --  29.4  MCHC 32.2  --  32.7  RDW 14.6  --  14.6  LYMPHSABS 2.4  --  2.5  MONOABS 1.2*  --  1.1*  EOSABS 0.2  --  0.1  BASOSABS 0.1  --  0.1    Chemistries  Recent Labs  Lab 01/25/21 1451 01/25/21 1505 01/26/21 0409 01/27/21 0448  NA 141 145 140 137  K 3.5 3.6 3.7 3.8  CL 105 107 106 104  CO2 26  --  22 23  GLUCOSE 93 91 113* 122*  BUN 16 17 20  33*  CREATININE 1.09* 1.10*  1.15* 1.29*  CALCIUM 9.7  --  9.3 8.7*  AST 21  --   --   --   ALT 17  --   --   --   ALKPHOS 87  --   --   --   BILITOT 0.9  --   --   --    ------------------------------------------------------------------------------------------------------------------ Recent Labs    01/26/21 0410  CHOL 137  HDL 50  LDLCALC 54  TRIG 163*  CHOLHDL 2.7    Lab Results  Component Value Date   HGBA1C 5.7 (H) 01/26/2021   ------------------------------------------------------------------------------------------------------------------ Recent Labs     01/27/21 0448  TSH 0.348*   ------------------------------------------------------------------------------------------------------------------ Recent Labs    01/27/21 0958  VITAMINB12 415    Coagulation profile Recent Labs  Lab 01/25/21 1451 01/26/21 0409  INR 1.0 1.1    No results for input(s): DDIMER in the last 72 hours.  Cardiac Enzymes No results for input(s): CKMB, TROPONINI, MYOGLOBIN in the last 168 hours.  Invalid input(s): CK ------------------------------------------------------------------------------------------------------------------    Component Value Date/Time   BNP 59.0 08/05/2019 1958     Deatra James M.D on 01/28/2021 at 4:13 PM  Go to [email or web address] - for contact info  [facility] - Office  [phone]

## 2021-01-28 NOTE — Progress Notes (Signed)
Patient has been more alert this shift. She starts a conversation but during conversation I noticed that she will say something that has no relation to what we were talking about or she appears to be confused to the situation we are presently in. She has been asking for pain medication constantly. I understand she has a history of chronic back pain.

## 2021-01-29 DIAGNOSIS — I10 Essential (primary) hypertension: Secondary | ICD-10-CM | POA: Diagnosis not present

## 2021-01-29 LAB — GLUCOSE, CAPILLARY
Glucose-Capillary: 131 mg/dL — ABNORMAL HIGH (ref 70–99)
Glucose-Capillary: 119 mg/dL — ABNORMAL HIGH (ref 70–99)
Glucose-Capillary: 132 mg/dL — ABNORMAL HIGH (ref 70–99)
Glucose-Capillary: 130 mg/dL — ABNORMAL HIGH (ref 70–99)

## 2021-01-29 LAB — URINE CULTURE
Culture: <10000 — AB
Special Requests: NORMAL

## 2021-01-29 MED ORDER — CARVEDILOL 3.125 MG PO TABS
6.2500 mg | ORAL_TABLET | Freq: Two times a day (BID) | ORAL | Status: DC
  Administered 2021-01-29 – 2021-01-31 (×3): 6.25 mg via ORAL
  Filled 2021-01-29 (×4): qty 2

## 2021-01-29 NOTE — Progress Notes (Signed)
Patient Demographics:    [name], is a 55 y.o. female, DOB - Jan 25, 1966, LSL:[id number]  Admit date - 01/25/2021   Admitting Physician Courage Denton Brick, MD  Outpatient Primary MD for the patient is Redmond School, MD  LOS - 3   Chief Complaint  Patient presents with  . Dizziness        Subjective:    [name] was seen and examined this morning, stable no acute distress. Less delusional, no signs of delirium... Cooperative this morning   Assessment  & Plan :    Principal Problem:   Acute CVA/punctate acute or subacute infarction in the lower Lt Paramedian Mid Brain Active Problems:   Hypothyroidism   Essential hypertension   QT prolongation   Addison's disease (HCC)   CAD (coronary artery disease)   GERD (gastroesophageal reflux disease)   Aphasia   Degeneration of lumbar intervertebral disc   Multiple falls   Acute metabolic encephalopathy  Brief Summary: 55 year old reformed smoker with past medical history relevant for Addison's disease, hypothyroidism, HTN, CAD, GERD and chronic back pain on chronic opiates admitted on 01/25/2021 with recurrent falls and aphasia and found to have acute stroke  A/p   1)Acute CVA/punctate acute or subacute infarction in the lower Lt Paramedian Mid Brain -Remained stable  -CT head and brain MRI noted -EEG suggesting left temporal epileptic form discharges...  -Neurology seen and evaluated the patient, recommending antiplatelet mono therapy Regarding EEG findings recommending antiseizure medication Topamax 25 mg p.o. twice daily -PTA patient was on aspirin 81 mg daily, ?  Aspirin failure so give Aspirin 81 mg daily along with Plavix 75 mg daily for 30 days then after that STOP the aspirin and continue ONLY Plavix 75 mg daily indefinitely--for secondary stroke prevention  -PTA patient was on Lipitor 20 mg daily increased to 40 mg daily -PT/OT and  speech pathologist eval appreciated---recommends SNF -Allow permissive hypertension-discontinued amlodipine -Patient had MRA head and MRA neck on November 14, 2020 without LVO -Echo with preserved EF of 55 to 60% without wall motion abnormalities or significant valvular problems -Watch for arrhythmias on telemetry unit -A1c is 5.7,  -LDL is 54 with HDL of 50---Even if her lipid panel is within desired limits, patient should still take Lipitor for it's Pleiotropic effects (beyond cholesterol lowering benefits)  2) Aphasia/dysphagia--no changes, speech pathology eval appreciated  3) chronic back pain--- -Currently pain improved with current analgesics - patient has had numerous injections and procedures and prior surgeries for neck and lower back pain currently on opiates chronically  -patient was scheduled for surgery on 02/04/2021 this will have to be postponed for now given acute stroke and need for Plavix -Patient has urinary retention and worsening back pain we will get lumbar MRI this admission -given recent falls -Be judicious with opiates and muscle relaxants  4) Addison's disease- -- awake, able to take oral intake will transition back to oral hydrocortisone at 20 mg twice daily -Hoping she will follow-up with PCP and endocrinology as an outpatient, to reduce hydrocortisone  5)GERD-Protonix as ordered  6)Hypothyroidism--- continue levothyroxine  7) acute metabolic encephalopathy -Moved -UA and chest x-ray without evidence of infection -UDS with opiates, benzos and THC, patient is prescribed opiates and benzos -Altered mentation was most likely  stroke related, awareness and mental status has improved significantly over the last 24 hours  8)Social/Ethics--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 -Plan of care discussed with patient's son Erlene Quan and patient's Sister Holley Raring,  - Patient is a full code  9) urinary retention--- in and out catheterization with  over 900 mL of urine, give Flomax, -Lumbar MRI as above #3  Disposition/Need for in-Hospital Stay- patient unable to be discharged at this time due to  -Met with TOC, social worker today continue to follow-up -acute CVA --- awaiting insurance authorization to go to [facility] rehab   Status is: Inpatient  Remains inpatient appropriate because:please see disposition above   Disposition: The patient is from: Home              Anticipated d/c is to: SNF              Anticipated d/c date is: 2 days--              Patient currently is medically stable to d/c. Barriers: Awaiting insurance authorization  Code Status :  -  Code Status: Full Code   Family Communication:  discussed with patient's son Erlene Quan and patient's Sister Holley Raring, Consults  :  Neuro requested  DVT Prophylaxis  :   - SCDs  SCD's Start: 01/26/21 0126 enoxaparin (LOVENOX) injection 40 mg Start: 01/25/21 2100   Lab Results  Component Value Date   PLT 348 01/26/2021    Inpatient Medications  Scheduled Meds: . aspirin  81 mg Oral Daily  . atorvastatin  40 mg Oral Daily  . carvedilol  6.25 mg Oral BID WC  . clopidogrel  75 mg Oral Daily  . enoxaparin (LOVENOX) injection  40 mg Subcutaneous Q24H  . gabapentin  600 mg Oral QID  . hydrocortisone  20 mg Oral BID  . levothyroxine  75 mcg Oral QAC breakfast  . polyethylene glycol  17 g Oral Daily  . senna-docusate  2 tablet Oral QHS  . tamsulosin  0.4 mg Oral QPC supper  . topiramate  25 mg Oral BID  . zolpidem  5 mg Oral QHS   Continuous Infusions:  PRN Meds:.acetaminophen **OR** acetaminophen (TYLENOL) oral liquid 160 mg/5 mL **OR** acetaminophen, meclizine, oxyCODONE    Anti-infectives (From admission, onward)   Start     Dose/Rate Route Frequency Ordered Stop   01/26/21 0600  vancomycin (VANCOCIN) IVPB 1000 mg/200 mL premix  Status:  Discontinued        1,000 mg 200 mL/hr over 60 Minutes Intravenous On call to O.R. 01/26/21 0125 01/26/21 1815         Objective:   Vitals:   01/29/21 0459 01/29/21 0848 01/29/21 0848 01/29/21 0851  BP: (!) 161/85 (!) 159/80 (!) 159/81 (!) 159/80  Pulse: 74 84 80 82  Resp: 20 16    Temp: 98.5 F (36.9 C) 98.7 F (37.1 C)    TempSrc: Oral Oral    SpO2: 98% 100% 100% 99%  Weight:        Wt Readings from Last 3 Encounters:  01/25/21 81 kg  11/13/20 80.7 kg  10/21/20 80.7 kg     Intake/Output Summary (Last 24 hours) at 01/29/2021 1408 Last data filed at 01/28/2021 2100 Gross per 24 hour  Intake 120 ml  Output 750 ml  Net -630 ml       Physical Exam:   General:  Alert, oriented, cooperative, no distress; expressive aphasia  HEENT:  Normocephalic, PERRL,  otherwise with in Normal limits   Neuro:  CNII-XII intact. , normal motor and sensation, reflexes intact   Lungs:   Clear to auscultation BL, Respirations unlabored, no wheezes / crackles  Cardio:    S1/S2, RRR, No murmure, No Rubs or Gallops   Abdomen:   Soft, non-tender, bowel sounds active all four quadrants,  no guarding or peritoneal signs.  Muscular skeletal:   Minimal right-sided weakness Limited exam - in bed, able to move all 4 extremities, Normal strength,  2+ pulses,  symmetric, No pitting edema  Skin:  Dry, warm to touch, negative for any Rashes,  Wounds: Please see nursing documentation               Data Review:   Micro Results Recent Results (from the past 240 hour(s))  Resp Panel by RT-PCR (Flu A&B, Covid) Nasopharyngeal Swab     Status: None   Collection Time: 01/25/21  3:40 PM   Specimen: Nasopharyngeal Swab; Nasopharyngeal(NP) swabs in vial transport medium  Result Value Ref Range Status   SARS Coronavirus 2 by RT PCR NEGATIVE NEGATIVE Final    Comment: (NOTE) SARS-CoV-2 target nucleic acids are NOT DETECTED.  The SARS-CoV-2 RNA is generally detectable in upper respiratory specimens during the acute phase of infection. The lowest concentration of SARS-CoV-2 viral copies this assay can detect is 138  copies/mL. A negative result does not preclude SARS-Cov-2 infection and should not be used as the sole basis for treatment or other patient management decisions. A negative result may occur with  improper specimen collection/handling, submission of specimen other than nasopharyngeal swab, presence of viral mutation(s) within the areas targeted by this assay, and inadequate number of viral copies(<138 copies/mL). A negative result must be combined with clinical observations, patient history, and epidemiological information. The expected result is Negative.  Fact Sheet for Patients:  EntrepreneurPulse.com.au  Fact Sheet for Healthcare Providers:  IncredibleEmployment.be  This test is no t yet approved or cleared by the [place] FDA and  has been authorized for detection and/or diagnosis of SARS-CoV-2 by FDA under an Emergency Use Authorization (EUA). This EUA will remain  in effect (meaning this test can be used) for the duration of the COVID-19 declaration under Section 564(b)(1) of the Act, 21 U.S.C.section 360bbb-3(b)(1), unless the authorization is terminated  or revoked sooner.       Influenza A by PCR NEGATIVE NEGATIVE Final   Influenza B by PCR NEGATIVE NEGATIVE Final    Comment: (NOTE) The Xpert Xpress SARS-CoV-2/FLU/RSV plus assay is intended as an aid in the diagnosis of influenza from Nasopharyngeal swab specimens and should not be used as a sole basis for treatment. Nasal washings and aspirates are unacceptable for Xpert Xpress SARS-CoV-2/FLU/RSV testing.  Fact Sheet for Patients: EntrepreneurPulse.com.au  Fact Sheet for Healthcare Providers: IncredibleEmployment.be  This test is not yet approved or cleared by the [place] FDA and has been authorized for detection and/or diagnosis of SARS-CoV-2 by FDA under an Emergency Use Authorization (EUA). This EUA will remain in effect (meaning  this test can be used) for the duration of the COVID-19 declaration under Section 564(b)(1) of the Act, 21 U.S.C. section 360bbb-3(b)(1), unless the authorization is terminated or revoked.  Performed at [facility], [address] [place], [place] [address]   Urine Culture     Status: Abnormal   Collection Time: 01/27/21  6:30 PM   Specimen: Urine, Catheterized  Result Value Ref Range Status   Specimen Description   Final  URINE, CATHETERIZED Performed at [facility], [address], [place], [place] [address]    Special Requests   Final    Normal Performed at [address] [place], [place] [address]    Culture (A)  Final    <10,000 COLONIES/mL INSIGNIFICANT GROWTH Performed at [facility] [address], [place], [place] [address]    Report Status 01/29/2021 FINAL  Final    Radiology Reports CT ABDOMEN PELVIS WO CONTRAST  Result Date: 01/25/2021 CLINICAL DATA:  Fall.  Abdominal trauma. EXAM: CT ABDOMEN AND PELVIS WITHOUT CONTRAST TECHNIQUE: Multidetector CT imaging of the abdomen and pelvis was performed following the standard protocol without IV contrast. COMPARISON:  10/24/2019 FINDINGS: Lower chest: No acute abnormality. Hepatobiliary: Prior cholecystectomy. No focal hepatic abnormality or evidence of perihepatic hematoma. Pancreas: No focal abnormality or ductal dilatation. Spleen: No splenic injury or perisplenic hematoma. Adrenals/Urinary Tract: No adrenal hemorrhage or renal injury identified. Bladder is unremarkable. Stomach/Bowel: Moderate stool burden throughout the colon. Stomach, large and small bowel grossly unremarkable. Normal appendix. Vascular/Lymphatic: Aortic atherosclerosis. No evidence of aneurysm or adenopathy. Reproductive: Prior hysterectomy.  No adnexal masses. Other: No free fluid or free air. Musculoskeletal: No acute bony abnormality. IMPRESSION: No acute findings in the abdomen or pelvis. Aortic atherosclerosis. Moderate stool  burden. Electronically Signed   By: Rolm Baptise M.D.   On: 01/25/2021 14:46   Chest 2 View  Result Date: 01/26/2021 CLINICAL DATA:  Dizziness and weakness EXAM: CHEST - 2 VIEW COMPARISON:  01/10/2020 FINDINGS: The heart size and mediastinal contours are within normal limits. Both lungs are clear. The visualized skeletal structures are unremarkable. IMPRESSION: No active cardiopulmonary disease. Electronically Signed   By: Ulyses Jarred M.D.   On: 01/26/2021 02:31   CT HEAD WO CONTRAST  Result Date: 01/25/2021 CLINICAL DATA:  Neuro deficit. Acute stroke suspected. Dizziness and weakness. Ten falls yesterday. Pain. EXAM: CT HEAD WITHOUT CONTRAST CT CERVICAL SPINE WITHOUT CONTRAST TECHNIQUE: Multidetector CT imaging of the head and cervical spine was performed following the standard protocol without intravenous contrast. Multiplanar CT image reconstructions of the cervical spine were also generated. COMPARISON:  None. FINDINGS: CT HEAD FINDINGS Brain: No subdural, epidural, or subarachnoid hemorrhage. Cerebellum, brainstem, and basal cisterns are normal. Ventricles and sulci are unremarkable. No mass effect or midline shift. No acute cortical ischemia or infarct. Vascular: No hyperdense vessel or unexpected calcification. Skull: Normal. Negative for fracture or focal lesion. Sinuses/Orbits: No acute finding. Other: None. CT CERVICAL SPINE FINDINGS Alignment: Evaluation is somewhat limited due to motion. Within the limitations of motion, no traumatic malalignment is identified. Skull base and vertebrae: No acute fracture. No primary bone lesion or focal pathologic process. Soft tissues and spinal canal: No prevertebral fluid or swelling. No visible canal hematoma. Disc levels: Patient is status post anterior plate screw fusion C5 through C7. Hardware is in good position. Mild degenerative disc disease with small anterior and posterior osteophytes. Upper chest: Negative. Other: No other abnormalities.  IMPRESSION: 1. No acute intracranial abnormalities identified. 2. Evaluation of the cervical spine is mildly limited by motion. Within these limitations, no fracture or traumatic malalignment identified. Electronically Signed   By: Dorise Bullion III M.D   On: 01/25/2021 14:56   CT Cervical Spine Wo Contrast  Result Date: 01/25/2021 CLINICAL DATA:  Neuro deficit. Acute stroke suspected. Dizziness and weakness. Ten falls yesterday. Pain. EXAM: CT HEAD WITHOUT CONTRAST CT CERVICAL SPINE WITHOUT CONTRAST TECHNIQUE: Multidetector CT imaging of the head and cervical spine  was performed following the standard protocol without intravenous contrast. Multiplanar CT image reconstructions of the cervical spine were also generated. COMPARISON:  None. FINDINGS: CT HEAD FINDINGS Brain: No subdural, epidural, or subarachnoid hemorrhage. Cerebellum, brainstem, and basal cisterns are normal. Ventricles and sulci are unremarkable. No mass effect or midline shift. No acute cortical ischemia or infarct. Vascular: No hyperdense vessel or unexpected calcification. Skull: Normal. Negative for fracture or focal lesion. Sinuses/Orbits: No acute finding. Other: None. CT CERVICAL SPINE FINDINGS Alignment: Evaluation is somewhat limited due to motion. Within the limitations of motion, no traumatic malalignment is identified. Skull base and vertebrae: No acute fracture. No primary bone lesion or focal pathologic process. Soft tissues and spinal canal: No prevertebral fluid or swelling. No visible canal hematoma. Disc levels: Patient is status post anterior plate screw fusion C5 through C7. Hardware is in good position. Mild degenerative disc disease with small anterior and posterior osteophytes. Upper chest: Negative. Other: No other abnormalities. IMPRESSION: 1. No acute intracranial abnormalities identified. 2. Evaluation of the cervical spine is mildly limited by motion. Within these limitations, no fracture or traumatic malalignment  identified. Electronically Signed   By: Dorise Bullion III M.D   On: 01/25/2021 14:56   MR BRAIN WO CONTRAST  Result Date: 01/26/2021 CLINICAL DATA:  Acute stroke presentation. Dizziness and weakness with falling. EXAM: MRI HEAD WITHOUT CONTRAST TECHNIQUE: Multiplanar, multiecho pulse sequences of the brain and surrounding structures were obtained without intravenous contrast. COMPARISON:  Head CT yesterday.  MRI 11/14/2020 FINDINGS: Brain: Newly seen punctate acute or subacute infarction in the lower left paramedian mid brain. No other acute finding. The brainstem and cerebellum appear otherwise normal. Cerebral hemispheres show a few punctate foci of T2 and FLAIR signal within the deep and subcortical white matter, often seen at this age. No cortical or large vessel territory infarction. No mass lesion, hemorrhage, hydrocephalus or extra-axial collection. Vascular: Major vessels at the base of the brain show flow. Skull and upper cervical spine: Negative Sinuses/Orbits: Clear/normal Other: None IMPRESSION: Subtle evidence of a punctate acute infarction in the lower left paramedian mid brain. No finding was present in this location on the study of 11/14/2020. The remainder the brain is essentially normal for age, with a few punctate foci of T2 and FLAIR signal within the cerebral hemispheric white matter as seen previously. Electronically Signed   By: Nelson Chimes M.D.   On: 01/26/2021 11:26   MR LUMBAR SPINE WO CONTRAST  Result Date: 01/28/2021 CLINICAL DATA:  Low back pain, technologist note states urinary retention and worsening chronic back pain EXAM: MRI LUMBAR SPINE WITHOUT CONTRAST TECHNIQUE: Multiplanar, multisequence MR imaging of the lumbar spine was performed. No intravenous contrast was administered. COMPARISON:  10/20/2020 FINDINGS: Segmentation:  Standard. Alignment:  Anteroposterior alignment is maintained. Vertebrae: Marrow edema associated with L4-L5 facet arthropathy. Vertebral body  heights are maintained. No suspicious osseous lesion. Conus medullaris and cauda equina: Conus extends to the L1 level. Conus and cauda equina appear normal. Paraspinal and other soft tissues: Paraspinal edema associated with L4-L5 facet arthropathy. Disc levels: L1-L2:  No stenosis. L2-L3:  No stenosis. L3-L4: Prior left laminectomy. Disc bulge with increased size of left subarticular/foraminal annular fissure and shallow protrusion. Facet arthropathy with residual right ligamentum flavum infolding. No canal stenosis. Unchanged slight narrowing of the subarticular recesses. Unchanged minor right foraminal stenosis. Slightly increased mild to moderate left foraminal stenosis. L4-L5: Prior left laminectomy. Disc bulge eccentric to the left. Marked facet arthropathy with increased joint effusions. There is  a more cystic-appearing 9 mm synovial cyst along the left lateral aspect of the spinal canal. Residual ligamentum flavum infolding. Similar mild canal stenosis. Similar partial effacement the right subarticular recess. Persistent effacement of the left subarticular recess with compression of traversing L5 nerve roots. Similar mild right and moderate left foraminal stenosis. L5-S1: Similar moderate right and mild left facet arthropathy. No canal or foraminal stenosis. IMPRESSION: Degenerative and prior postoperative changes as detailed above. There is no high-grade canal stenosis. Increased facet arthropathy at L4-L5 with marrow and soft tissue edema, fusions, and intraspinal synovial cyst compressing the traversing left L5 nerve roots. Slightly increased disc protrusion/annular fissure and left foraminal stenosis at L3-L4. Electronically Signed   By: Macy Mis M.D.   On: 01/28/2021 12:01   EEG adult  Result Date: 01/28/2021 Lora Havens, MD     01/28/2021  8:41 AM Patient Name: ABIGAEL MOGLE MRN: [id number] Epilepsy Attending: Lora Havens Referring Physician/Provider: Dr Roxan Hockey Date:  01/27/2021 Duration: 23.30 mins Patient history: 55 year old female with altered mental status.  EEG to evaluate for seizures. Level of alertness: Awake AEDs during EEG study: Topiramate, zonisamide, gabapentin Technical aspects: This EEG study was done with scalp electrodes positioned according to the 10-20 International system of electrode placement. Electrical activity was acquired at a sampling rate of _0  and reviewed with a high frequency filter of _1  and a low frequency filter of _2 . EEG data were recorded continuously and digitally stored. Description: The posterior dominant rhythm consists of 9-10 Hz activity of moderate voltage (25-35 uV) seen predominantly in posterior head regions, symmetric and reactive to eye opening and eye closing. Hyperventilation and photic stimulation were not performed.   IMPRESSION: This study is within normal limits. No seizures or epileptiform discharges were seen throughout the recording. Lora Havens   MM 3D SCREEN BREAST BILATERAL  Result Date: 01/12/2021 CLINICAL DATA:  Screening. EXAM: DIGITAL SCREENING BILATERAL MAMMOGRAM WITH TOMOSYNTHESIS AND CAD TECHNIQUE: Bilateral screening digital craniocaudal and mediolateral oblique mammograms were obtained. Bilateral screening digital breast tomosynthesis was performed. The images were evaluated with computer-aided detection. COMPARISON:  Previous exam(s). ACR Breast Density Category b: There are scattered areas of fibroglandular density. FINDINGS: There are no findings suspicious for malignancy. The images were evaluated with computer-aided detection. IMPRESSION: No mammographic evidence of malignancy. A result letter of this screening mammogram will be mailed directly to the patient. RECOMMENDATION: Screening mammogram in one year. (Code:SM-B-01Y) BI-RADS CATEGORY  1: Negative. Electronically Signed   By: Nolon Nations M.D.   On: 01/12/2021 15:42   ECHOCARDIOGRAM LIMITED  Result Date: 01/26/2021     ECHOCARDIOGRAM LIMITED REPORT   Patient Name:   SOLEI WUBBEN Date of Exam: 01/26/2021 Medical Rec #:  [id number]      Height:       65.5 in Accession #:    [id number]     Weight:       178.6 lb Date of Birth:  1966/07/22       BSA:          1.896 m Patient Age:    82 years       BP:           175/101 mmHg Patient Gender: F              HR:           90 bpm. Exam Location:  [facility] Procedure: Limited Echo Indications:    Stroke I63.9  History:  Patient has prior history of Echocardiogram examinations, most                 recent 10/08/2020. CAD, Signs/Symptoms:Chest Pain; Risk                 Factors:Former Smoker and Hypertension. GERD, Mitral Valve                 Prolapse.  Sonographer:    Leavy Cella RDCS (AE) Referring Phys: [id number] COURAGE EMOKPAE IMPRESSIONS  1. Left ventricular ejection fraction, by estimation, is 55 to 60%. The left ventricle has normal function. The left ventricle has no regional wall motion abnormalities. There is mild left ventricular hypertrophy.  2. Right ventricular systolic function is normal. The right ventricular size is normal.  3. The mitral valve is normal in structure. No evidence of mitral valve regurgitation.  4. Aortic valve regurgitation is not visualized. Mild aortic valve sclerosis is present, with no evidence of aortic valve stenosis.  5. The inferior vena cava is normal in size with greater than 50% respiratory variability, suggesting right atrial pressure of 3 mmHg. Comparison(s): The left ventricular function is unchanged. FINDINGS  Left Ventricle: Left ventricular ejection fraction, by estimation, is 55 to 60%. The left ventricle has normal function. The left ventricle has no regional wall motion abnormalities. The left ventricular internal cavity size was normal in size. There is  mild left ventricular hypertrophy. Right Ventricle: The right ventricular size is normal. Right vetricular wall thickness was not assessed. Right ventricular systolic function is  normal. Left Atrium: Left atrial size was normal in size. Right Atrium: Right atrial size was normal in size. Pericardium: There is no evidence of pericardial effusion. Mitral Valve: The mitral valve is normal in structure. Tricuspid Valve: The tricuspid valve is normal in structure. Tricuspid valve regurgitation is not demonstrated. Aortic Valve: Aortic valve regurgitation is not visualized. Mild aortic valve sclerosis is present, with no evidence of aortic valve stenosis. Pulmonic Valve: The pulmonic valve was not well visualized. Aorta: The aortic root is normal in size and structure. Venous: The inferior vena cava is normal in size with greater than 50% respiratory variability, suggesting right atrial pressure of 3 mmHg. LEFT VENTRICLE PLAX 2D LVIDd:         4.01 cm  Diastology LVIDs:         2.88 cm  LV e' medial:    5.34 cm/s LV PW:         1.18 cm  LV E/e' medial:  11.1 LV IVS:        1.31 cm  LV e' lateral:   11.00 cm/s LVOT diam:     1.90 cm  LV E/e' lateral: 5.4 LVOT Area:     2.84 cm  LEFT ATRIUM           Index LA diam:      3.30 cm 1.74 cm/m LA Vol (A4C): 26.0 ml 13.71 ml/m   AORTA Ao Root diam: 2.80 cm MITRAL VALVE MV Area (PHT): 4.21 cm     SHUNTS MV Decel Time: 180 msec     Systemic Diam: 1.90 cm MV E velocity: 59.10 cm/s MV A velocity: 104.00 cm/s MV E/A ratio:  0.57 Dorris Carnes MD Electronically signed by Dorris Carnes MD Signature Date/Time: 01/26/2021/5:30:21 PM    Final      CBC Recent Labs  Lab 01/25/21 1451 01/25/21 1505 01/26/21 0409  WBC 11.5*  --  11.3*  HGB 17.2* 18.4* 16.6*  HCT  53.4* 54.0* 50.8*  PLT 342  --  348  MCV 90.4  --  89.9  MCH 29.1  --  29.4  MCHC 32.2  --  32.7  RDW 14.6  --  14.6  LYMPHSABS 2.4  --  2.5  MONOABS 1.2*  --  1.1*  EOSABS 0.2  --  0.1  BASOSABS 0.1  --  0.1    Chemistries  Recent Labs  Lab 01/25/21 1451 01/25/21 1505 01/26/21 0409 01/27/21 0448  NA 141 145 140 137  K 3.5 3.6 3.7 3.8  CL 105 107 106 104  CO2 26  --  22 23  GLUCOSE  93 91 113* 122*  BUN _0 33*  CREATININE 1.09* 1.10* 1.15* 1.29*  CALCIUM 9.7  --  9.3 8.7*  AST 21  --   --   --   ALT 17  --   --   --   ALKPHOS 87  --   --   --   BILITOT 0.9  --   --   --    ------------------------------------------------------------------------------------------------------------------ No results for input(s): CHOL, HDL, LDLCALC, TRIG, CHOLHDL, LDLDIRECT in the last 72 hours.  Lab Results  Component Value Date   HGBA1C 5.7 (H) 01/26/2021   ------------------------------------------------------------------------------------------------------------------ Recent Labs    01/27/21 0448  TSH 0.348*   ------------------------------------------------------------------------------------------------------------------ Recent Labs    01/27/21 0958  VITAMINB12 415    Coagulation profile Recent Labs  Lab 01/25/21 1451 01/26/21 0409  INR 1.0 1.1    No results for input(s): DDIMER in the last 72 hours.  Cardiac Enzymes No results for input(s): CKMB, TROPONINI, MYOGLOBIN in the last 168 hours.  Invalid input(s): CK ------------------------------------------------------------------------------------------------------------------    Component Value Date/Time   BNP 59.0 08/05/2019 1958     Deatra James M.D on 01/29/2021 at 2:08 PM  Go to [email or web address] - for contact info  [facility] - Office  [phone]

## 2021-01-29 NOTE — Care Management Important Message (Signed)
Important Message  Patient Details  Name: [name] MRN: [id number] Date of Birth: Oct 24, 1965   Medicare Important Message Given:  Yes     Tommy Medal 01/29/2021, 12:31 PM

## 2021-01-29 NOTE — Progress Notes (Addendum)
[place] A. Merlene Laughter, MD     [email or web address]          [name] is an 55 y.o. female.   ASSESSMENT/PLAN: 1.  Recurrent Vertiginous symptoms of unclear etiology. MRI shows questionable left midbrain lacunar infarct. This is somewhat unimpressive.  The son seems to indicate that the vertiginous symptoms is recurrent and the lacune infarct if indeed present would not explain the recurrent ongoing vertiginous symptoms. She does have multiple medical comorbidities including coronary artery disease, Addison's insufficiency, congestive heart failure and COPD which could contribute or cause the vertiginous symptoms.  If indeed the patient does have a lacunar infarct, aspirin 81 mg should suffice. 2. Marked stupor/encephalopathy in the hospital with is undoubtedly due to medication effect. Minimize psychotropic medications. EEG suggested left temporal epileptiform discharges. Consequently, the patient will be started on antiseizure medication. Topiramate 25 mg twice a day will be initiated for now. This may also help with chronic pain issues that she has. 3. Talkative and pressures speak suggest possible bipolar disorder. This may explain some of the cognitive and the paranoid issues noted.    She is more responsive and appropriate today with less paranoia but quite talkative with somewhat pressured speech. She reports that she has had the untoward side effect with baclofen with the medication causing gait instability, leg weakness, falls and drowsiness.   GENERAL:  She is much more awake and responsive.  HEENT:   Neck is supple no trauma noted.  ABDOMEN: soft  EXTREMITIES: No edema   BACK: Normal  SKIN: Normal by inspection.    MENTAL STATUS:   She is awake and alert and converses.  She is much more lucid and appropriate. She even recall that she sees my nurse practitioner in the office which in checking our records is correct. Her speech is somewhat pressured and  she is talkative however.   CRANIAL NERVES: Pupils are equal, round and reactive to light; extra ocular movements are full, there is no significant nystagmus; visual fields unable to be tested given the severe stupor; upper and lower facial muscles are normal in strength and symmetric, there is no flattening of the nasolabial folds; tongue is midline  MOTOR:  2 to 3+ movement to deep painful stimuli in the upper extremities.  COORDINATION:  No tremors, myoclonus or abnormal movements or parkinsonism noted.      Blood pressure (!) 161/85, pulse 74, temperature 98.5 F (36.9 C), temperature source Oral, resp. rate 20, weight 81 kg, SpO2 98 %.  Past Medical History:  Diagnosis Date  . Addison's disease ([place])   . Addison's disease ([place])   . Adrenal insufficiency ([place])    diagnosed 2012  . Aneurysm ([place])   . Anxiety   . Arthritis   . Astigmatism   . CAD (coronary artery disease)    Cath 2008 EF normal. RCA 50-60, Septal 50%. Myoview 3/12: EF 53% normal perfusion  . Cardiac arrest ([place])    2/2 adissonian crisis  . Cardiomyopathy    resolved  . Chest pain    chronicc  . CHF (congestive heart failure) ([place])   . Chronic back pain   . Chronic diarrhea   . Concussion    sept 28th 2014  . Family history of breast cancer   . Family history of colon cancer   . Gastroparesis   . GERD (gastroesophageal reflux disease)   . HTN (hypertension)   . Hyperlipidemia   . Hypothyroidism   . Mitral valve prolapse   .  Nondiabetic gastroparesis   . PONV (postoperative nausea and vomiting)   . QT prolongation   . Tobacco abuse    down to 2 cigarettes per day  . Vertigo     Past Surgical History:  Procedure Laterality Date  . ABDOMINAL HYSTERECTOMY    . CARDIAC CATHETERIZATION  03/2007   showed 60% lesion in the right coronary artery  . CHOLECYSTECTOMY    . LEFT HEART CATHETERIZATION WITH CORONARY ANGIOGRAM N/A 04/13/2012   Procedure: LEFT HEART CATHETERIZATION WITH CORONARY ANGIOGRAM;   Surgeon: Hillary Bow, MD;  Location[facility];  Service: Cardiovascular;  Laterality: N/A;  . LUMBAR LAMINECTOMY/DECOMPRESSION MICRODISCECTOMY Left 12/20/2018   Procedure: Left Lumbar Three-Four Lumbar Laminotomy and Foraminotomy, Lumbar Four-Five Laminotomy and Foraminotomy with Microdiscectomy and Resection of Synovial Cyst;  Surgeon: Jovita Gamma, MD;  Location: [place];  Service: Neurosurgery;  Laterality: Left;  Left Lumbar 3-4 Lumbar laminotomy, foraminotomy, possible microdiscectomy with possible resection of synovial cyst  . SPINE SURGERY    . VARICOSE VEIN SURGERY    . VESICOVAGINAL FISTULA CLOSURE W/ TAH      Family History  Problem Relation Age of Onset  . Cancer Father        Colon  . Coronary artery disease Other   . Heart attack Mother        d. 61  . Breast cancer Paternal Uncle 107  . Dementia Maternal Grandmother   . Leukemia Maternal Uncle 1    Social History:  reports that she quit smoking 4 days ago. Her smoking use included cigarettes. She has a 2.50 pack-year smoking history. She has never used smokeless tobacco. She reports current drug use. Drugs: Marijuana and Oxycodone. She reports that she does not drink alcohol.  Allergies:  Allergies  Allergen Reactions  . Bee Venom Anaphylaxis  . Doxycycline Other (See Comments) and Nausea Only    Due to Pre-Existing conditions involved with stomach, patient does not take the following medication  . Erythromycin Other (See Comments) and Nausea And Vomiting    Due to Pre-Existing conditions involved with stomach, patient does not take the following medication  . Ibuprofen Nausea Only    gastroparesis   . Penicillins Anaphylaxis and Swelling    Has patient had a PCN reaction causing immediate rash, facial/tongue/throat swelling, SOB or lightheadedness with hypotension: Yes Has patient had a PCN reaction causing severe rash involving mucus membranes or skin necrosis: Yes Has patient had a PCN reaction that  required hospitalization No Has patient had a PCN reaction occurring within the last 10 years: No If all of the above answers are "NO", then may proceed with Cephalosporin use.   . Sulfa Antibiotics Hives and Itching  . Cat Hair Extract Hives, Itching and Swelling    SWELLING REACTION UNSPECIFIED   . Dust Mite Extract Hives and Swelling  . Tramadol Diarrhea, Nausea And Vomiting and Nausea Only  . Morphine And Related Itching    Medications: Prior to Admission medications   Medication Sig Start Date End Date Taking? Authorizing Provider  albuterol (VENTOLIN HFA) 108 (90 Base) MCG/ACT inhaler Inhale 1-2 puffs into the lungs every 6 (six) hours as needed for wheezing or shortness of breath.  10/22/19  Yes [provider]  alendronate (FOSAMAX) 70 MG tablet Take 70 mg by mouth once a week. 09/15/20  Yes [provider]  ALPRAZolam Duanne Moron) 0.5 MG tablet Take 0.5 mg by mouth 3 (three) times daily. 10/09/20  Yes [provider]  amLODipine ([place])  5 MG tablet Take 1 tablet (5 mg total) by mouth daily. Patient taking differently: Take 10 mg by mouth daily. 01/14/21  Yes Hilty, Nadean Corwin, MD  aspirin 81 MG chewable tablet Chew 81 mg by mouth daily.    Yes [provider]  atorvastatin (LIPITOR) 20 MG tablet Take 20 mg by mouth daily. 10/08/20  Yes [provider]  BACLOFEN PO Take 1 tablet by mouth 3 (three) times daily.   Yes [provider]  buPROPion (WELLBUTRIN SR) 150 MG 12 hr tablet Take 150 mg by mouth 2 (two) times daily.   Yes [provider]  cholecalciferol (VITAMIN D) 1000 UNITS tablet Take 1,000 Units by mouth daily.   Yes [provider]  gabapentin (NEURONTIN) 600 MG tablet Take 600 mg by mouth 4 (four) times daily. 07/25/19  Yes [provider]  hydrocortisone (CORTEF) 10 MG tablet Take 10-20 mg by mouth See admin instructions. Takes 20 mg in the morning and 10 mg at night 04/07/16  Yes [provider]   levothyroxine (SYNTHROID, LEVOTHROID) 75 MCG tablet Take 75 mcg by mouth daily before breakfast.   Yes [provider]  meclizine (ANTIVERT) 25 MG tablet 1 or 2 tabs PO q8h prn dizziness Patient taking differently: Take 25-50 mg by mouth 3 (three) times daily as needed (for vertigo). 04/16/16  Yes Francine Graven, DO  nitrofurantoin, macrocrystal-monohydrate, (MACROBID) 100 MG capsule Take 100 mg by mouth 2 (two) times daily. 01/22/21  Yes [provider]  nitroGLYCERIN (NITROSTAT) 0.4 MG SL tablet Place 1 tablet (0.4 mg total) under the tongue every 5 (five) minutes as needed for chest pain. 09/17/20  Yes Hilty, Nadean Corwin, MD  ondansetron (ZOFRAN ODT) 4 MG disintegrating tablet Take 1 tablet (4 mg total) by mouth every 8 (eight) hours as needed for nausea. 08/07/19  Yes Emokpae, Courage, MD  oxyCODONE (ROXICODONE) 15 MG immediate release tablet Take 15 mg by mouth every 4 (four) hours as needed for pain.  07/27/19  Yes [provider]  tiZANidine (ZANAFLEX) 4 MG tablet Take 4 mg by mouth 4 (four) times daily as needed. 10/14/20  Yes [provider]  zolpidem (AMBIEN) 10 MG tablet Take 10 mg by mouth at bedtime. 05/06/16  Yes [provider]  furosemide (LASIX) 20 MG tablet TAKE 1 TABLET BY MOUTH EVERY DAY Patient not taking: No sig reported 12/09/20   Pixie Casino, MD    Scheduled Meds: . aspirin  81 mg Oral Daily  . atorvastatin  40 mg Oral Daily  . carvedilol  3.125 mg Oral BID WC  . clopidogrel  75 mg Oral Daily  . enoxaparin (LOVENOX) injection  40 mg Subcutaneous Q24H  . gabapentin  600 mg Oral QID  . hydrocortisone  20 mg Oral BID  . levothyroxine  75 mcg Oral QAC breakfast  . polyethylene glycol  17 g Oral Daily  . senna-docusate  2 tablet Oral QHS  . tamsulosin  0.4 mg Oral QPC supper  . topiramate  25 mg Oral BID  . zolpidem  5 mg Oral QHS   Continuous Infusions:  PRN Meds:.acetaminophen **OR** acetaminophen (TYLENOL) oral liquid 160  mg/5 mL **OR** acetaminophen, meclizine, oxyCODONE     Results for orders placed or performed during the hospital encounter of 01/25/21 (from the past 48 hour(s))  RPR     Status: None   Collection Time: 01/27/21  9:58 AM  Result Value Ref Range   RPR Ser Ql NON REACTIVE NON  REACTIVE    Comment: Performed at [facility], [place] [address], [place], [place] [address]  Vitamin B12     Status: None   Collection Time: 01/27/21  9:58 AM  Result Value Ref Range   Vitamin B-12 415 180 - 914 pg/mL    Comment: (NOTE) This assay is not validated for testing neonatal or myeloproliferative syndrome specimens for Vitamin B12 levels. Performed at [facility], [address] [place], [place] [address]   Glucose, capillary     Status: Abnormal   Collection Time: 01/27/21 11:06 AM  Result Value Ref Range   Glucose-Capillary 169 (H) 70 - 99 mg/dL    Comment: Glucose reference range applies only to samples taken after fasting for at least 8 hours.  Glucose, capillary     Status: Abnormal   Collection Time: 01/27/21  4:39 PM  Result Value Ref Range   Glucose-Capillary 122 (H) 70 - 99 mg/dL    Comment: Glucose reference range applies only to samples taken after fasting for at least 8 hours.  Urinalysis, Routine w reflex microscopic Urine, Catheterized     Status: Abnormal   Collection Time: 01/27/21  6:30 PM  Result Value Ref Range   Color, Urine YELLOW YELLOW   APPearance CLEAR CLEAR   Specific Gravity, Urine 1.020 1.005 - 1.030   pH 6.0 5.0 - 8.0   Glucose, UA NEGATIVE NEGATIVE mg/dL   Hgb urine dipstick SMALL (A) NEGATIVE   Bilirubin Urine NEGATIVE NEGATIVE   Ketones, ur NEGATIVE NEGATIVE mg/dL   Protein, ur 30 (A) NEGATIVE mg/dL   Nitrite NEGATIVE NEGATIVE   Leukocytes,Ua NEGATIVE NEGATIVE   RBC / HPF 0-5 0 - 5 RBC/hpf   WBC, UA 0-5 0 - 5 WBC/hpf   Bacteria, UA NONE SEEN NONE SEEN    Comment: Performed at [facility], [address] [place], [place] [address]  Glucose, capillary      Status: Abnormal   Collection Time: 01/28/21 12:39 AM  Result Value Ref Range   Glucose-Capillary 142 (H) 70 - 99 mg/dL    Comment: Glucose reference range applies only to samples taken after fasting for at least 8 hours.  Glucose, capillary     Status: Abnormal   Collection Time: 01/28/21 11:23 AM  Result Value Ref Range   Glucose-Capillary 147 (H) 70 - 99 mg/dL    Comment: Glucose reference range applies only to samples taken after fasting for at least 8 hours.  Glucose, capillary     Status: Abnormal   Collection Time: 01/29/21  5:39 AM  Result Value Ref Range   Glucose-Capillary 131 (H) 70 - 99 mg/dL    Comment: Glucose reference range applies only to samples taken after fasting for at least 8 hours.    Studies/Results:   BRAIN MRI WO  01-2021 FINDINGS: Brain: Newly seen punctate acute or subacute infarction in the lower left paramedian mid brain. No other acute finding. The brainstem and cerebellum appear otherwise normal. Cerebral hemispheres show a few punctate foci of T2 and FLAIR signal within the deep and subcortical white matter, often seen at this age. No cortical or large vessel territory infarction. No mass lesion, hemorrhage, hydrocephalus or extra-axial collection.  Vascular: Major vessels at the base of the brain show flow.  Skull and upper cervical spine: Negative  Sinuses/Orbits: Clear/normal  Other: None  IMPRESSION: Subtle evidence of a punctate acute infarction in the lower left paramedian mid brain. No finding was present in this location on the study  of 11/14/2020. The remainder the brain is essentially normal for age, with a few punctate foci of T2 and FLAIR signal within the cerebral hemispheric white matter as seen previously.      CT C SPINE HEAD  IMPRESSION: 1. No acute intracranial abnormalities identified. 2. Evaluation of the cervical spine is mildly limited by motion. Within these limitations, no fracture or traumatic  malalignment Identified.    BRAIN MRI/MRA NECK MRA 11-2020 IMPRESSION: 1. No acute intracranial abnormality. 2. Minimal cerebral white matter T2 signal changes, slightly progressed from 2017 and nonspecific with considerations including chronic small vessel ischemia, migraines, and prior infection/inflammation. No specific findings to suggest demyelinating disease. 3. Negative head MRA. 4. Negative neck MRA.   TTE 01-2021 1. Left ventricular ejection fraction, by estimation, is 55 to 60%. The left ventricle has normal function. The left ventricle has no regional wall motion abnormalities. There is mild left ventricular hypertrophy. 2. Right ventricular systolic function is normal. The right ventricular size is normal. 3. The mitral valve is normal in structure. No evidence of mitral valve regurgitation. 4. Aortic valve regurgitation is not visualized. Mild aortic valve sclerosis is present, with no evidence of aortic valve stenosis. 5. The inferior vena cava is normal in size with greater than 50% respiratory variability, suggesting right atrial pressure of 3 mmHg. Comparison(s): The left ventricular function is unchanged.   TTE 10-2020 1. Left ventricular ejection fraction, by estimation, is 55 to 60%. The  left ventricle has normal function. The left ventricle has no regional  wall motion abnormalities. Left ventricular diastolic parameters are  consistent with Grade I diastolic  dysfunction (impaired relaxation).  2. Right ventricular systolic function is normal. The right ventricular  size is normal. There is normal pulmonary artery systolic pressure. The  estimated right ventricular systolic pressure is 16.1 mmHg.  3. Left atrial size was mildly dilated.  4. The mitral valve is normal in structure. Trivial mitral valve  regurgitation. No evidence of mitral stenosis.  5. The aortic valve is tricuspid. Aortic valve regurgitation is not  visualized. Mild aortic valve  sclerosis is present, with no evidence of  aortic valve stenosis.  6. The inferior vena cava is normal in size with greater than 50%  respiratory variability, suggesting right atrial pressure of 3 mmHg.    The brain MRI is reviewed in person. There is questionable of faint increased still involving the left midbrain of questionable significance. There is minimal chronic changes seen on FLAIR imaging involving the white matter. No encephalomalacia is noted. No hemorrhages noted. The scan this mostly unrevealing.    Cadel Stairs A. Merlene Laughter, M.D.  Diplomate, Tax adviser of Psychiatry and Neurology ( Neurology). 01/29/2021, 6:47 AM

## 2021-01-29 NOTE — TOC Progression Note (Signed)
Transition of Care [facility]) - Progression Note    Patient Details  Name: [name] MRN: [id number] Date of Birth: Oct 02, 1965  [facility] CM/SW Contact  Natasha Bence, LCSW Phone Number: 01/29/2021, 4:26 PM  Clinical Narrative:    CSW spoke with Debbie from [place]. Debbie reported that patient's insurance auth had been denied but there was an option for peer to peer. Debbie provided the number for peer to peer as [phone] opt 3 and reported that MD had until 12:00 noon 01/30/2021. CSW notified supervisor and MD. TOC to follow.   Expected Discharge Plan: [place] Barriers to Discharge: Insurance Authorization  Expected Discharge Plan and Services Expected Discharge Plan: [place] In-house Referral: NA Discharge Planning Services: CM Consult Post Acute Care Choice: [place] arrangements for the past 2 months: Single Family Home                 DME Arranged: N/A DME Agency: NA       HH Arranged: NA HH Agency: NA         Social Determinants of Health (SDOH) Interventions    Readmission Risk Interventions No flowsheet data found.

## 2021-01-30 DIAGNOSIS — I10 Essential (primary) hypertension: Secondary | ICD-10-CM | POA: Diagnosis not present

## 2021-01-30 LAB — HOMOCYSTEINE

## 2021-01-30 LAB — GLUCOSE, CAPILLARY
Glucose-Capillary: 112 mg/dL — ABNORMAL HIGH (ref 70–99)
Glucose-Capillary: 97 mg/dL (ref 70–99)

## 2021-01-30 MED ORDER — TRAZODONE HCL 50 MG PO TABS
50.0000 mg | ORAL_TABLET | Freq: Every day | ORAL | Status: DC
  Administered 2021-01-30: 50 mg via ORAL
  Filled 2021-01-30: qty 1

## 2021-01-30 MED ORDER — ALPRAZOLAM 0.25 MG PO TABS
0.2500 mg | ORAL_TABLET | Freq: Three times a day (TID) | ORAL | Status: DC
  Administered 2021-01-30 – 2021-01-31 (×4): 0.25 mg via ORAL
  Filled 2021-01-30 (×4): qty 1

## 2021-01-30 MED ORDER — ONDANSETRON 4 MG PO TBDP
4.0000 mg | ORAL_TABLET | Freq: Three times a day (TID) | ORAL | Status: DC | PRN
  Administered 2021-01-30 – 2021-01-31 (×2): 4 mg via ORAL
  Filled 2021-01-30 (×2): qty 1

## 2021-01-30 MED ORDER — HALOPERIDOL LACTATE 5 MG/ML IJ SOLN
1.0000 mg | Freq: Four times a day (QID) | INTRAMUSCULAR | Status: DC | PRN
  Administered 2021-01-31: 1 mg via INTRAVENOUS
  Filled 2021-01-30: qty 1

## 2021-01-30 MED ORDER — BUPROPION HCL 100 MG PO TABS
100.0000 mg | ORAL_TABLET | Freq: Two times a day (BID) | ORAL | Status: DC
  Administered 2021-01-30 – 2021-01-31 (×3): 100 mg via ORAL
  Filled 2021-01-30 (×7): qty 1

## 2021-01-30 NOTE — Care Management Important Message (Signed)
Important Message  Patient Details  Name: [name] MRN: [id number] Date of Birth: Oct 29, 1965   Medicare Important Message Given:  Yes     Tommy Medal 01/30/2021, 11:51 AM

## 2021-01-30 NOTE — Progress Notes (Signed)
Patient is refusing all medications and states "I will not be poisoned by these". She is calm and otherwise cooperative. Will continue to monitor.

## 2021-01-30 NOTE — Progress Notes (Signed)
Surgical Instructions    Your procedure is scheduled on Wednesday, June 1st, 2022.  Report to [facility] Main Entrance "A" at 08:30 A.M., then check in with the Admitting office.  Call this number if you have problems the morning of surgery:  [phone]   If you have any questions prior to your surgery date call [phone]: Open Monday-Friday 8am-4pm    Remember:  Do not eat or drink after midnight the night before your surgery   Take these medicines the morning of surgery with A SIP OF WATER  ALPRAZolam (XANAX) amLODipine (NORVASC) atorvastatin (LIPITOR)  BACLOFEN  buPROPion (WELLBUTRIN SR)  gabapentin (NEURONTIN) hydrocortisone (CORTEF) levothyroxine (SYNTHROID, LEVOTHROID) nitrofurantoin, macrocrystal-monohydrate, (MACROBID)  If needed:   albuterol (VENTOLIN HFA)  meclizine (ANTIVERT) nitroGLYCERIN (NITROSTAT) - please, let the nurse know if you take it ondansetron (ZOFRAN ODT) oxyCODONE (ROXICODONE)  tiZANidine (ZANAFLEX)  Follow your surgeon's instructions on when to stop Aspirin.  If no instructions were given by your surgeon then you will need to call the office to get those instructions.    As of today, STOP taking any Aspirin (unless otherwise instructed by your surgeon) Aleve, Naproxen, Ibuprofen, Motrin, Advil, Goody's, BC's, all herbal medications, fish oil, and all vitamins.                     Do not wear jewelry, make up, or nail polish DO Not wear nail polish, gel polish, artificial nails, or any other type of covering on natural nails including finger and toenails. If patients have artificial nails, gel coating, etc. that need to be removed by a nail saloon please have this removed prior to surgery or surgery may need to be canceled/delayed if the surgeon/ anesthesia feels like the patient is unable to be adequately monitored.            Do not wear lotions, powders, colognes, or deodorant.            Do not shave 48 hours prior to surgery.               Do not bring valuables to the hospital.            [facility] is not responsible for any belongings or valuables.  Do NOT Smoke (Tobacco/Vaping) or drink Alcohol 24 hours prior to your procedure If you use a CPAP at night, you may bring all equipment for your overnight stay.   Contacts, glasses, dentures or bridgework may not be worn into surgery, please bring cases for these belongings   For patients admitted to the hospital, discharge time will be determined by your treatment team.   Patients discharged the day of surgery will not be allowed to drive home, and someone needs to stay with them for 24 hours.    Special instructions:    Oral Hygiene is also important to reduce your risk of infection.  Remember - BRUSH YOUR TEETH THE MORNING OF SURGERY WITH YOUR REGULAR TOOTHPASTE   [facility]- Preparing For Surgery  Before surgery, you can play an important role. Because skin is not sterile, your skin needs to be as free of germs as possible. You can reduce the number of germs on your skin by washing with CHG (chlorahexidine gluconate) Soap before surgery.  CHG is an antiseptic cleaner which kills germs and bonds with the skin to continue killing germs even after washing.     Please do not use if you have an allergy to CHG or antibacterial  soaps. If your skin becomes reddened/irritated stop using the CHG.  Do not shave (including legs and underarms) for at least 48 hours prior to first CHG shower. It is OK to shave your face.  Please follow these instructions carefully.    1.  Shower the NIGHT BEFORE SURGERY and the MORNING OF SURGERY with CHG Soap.   If you chose to wash your hair, wash your hair first as usual with your normal shampoo. After you shampoo, rinse your hair and body thoroughly to remove the shampoo.  Then [employer] and genitals (private parts) with your normal soap and rinse thoroughly to remove soap.  2. After that Use CHG Soap as you would any other liquid soap. You  can apply CHG directly to the skin and wash gently with a scrungie or a clean washcloth.   3. Apply the CHG Soap to your body ONLY FROM THE NECK DOWN.  Do not use on open wounds or open sores. Avoid contact with your eyes, ears, mouth and genitals (private parts). Wash Face and genitals (private parts)  with your normal soap.   4. Wash thoroughly, paying special attention to the area where your surgery will be performed.  5. Thoroughly rinse your body with warm water from the neck down.  6. DO NOT shower/wash with your normal soap after using and rinsing off the CHG Soap.  7. Pat yourself dry with a CLEAN TOWEL.  8. Wear CLEAN PAJAMAS to bed the night before surgery  9. Place CLEAN SHEETS on your bed the night before your surgery  10. DO NOT SLEEP WITH PETS.   Day of Surgery:  Take a shower with CHG soap. Wear Clean/Comfortable clothing the morning of surgery Do not apply any deodorants/lotions.   Remember to brush your teeth WITH YOUR REGULAR TOOTHPASTE.   Please read over the following fact sheets that you were given.

## 2021-01-30 NOTE — Progress Notes (Signed)
Physical Therapy Treatment Patient Details Name: [name] MRN: [id number] DOB: [birth year] Today's Date: 01/30/2021    History of Present Illness [name] is a 55 y.o. female with a history of hypertension, Addison's disease on chronic steroids, degenerative disc disease with chronic pain in her neck and her lumbar spine, hypothyroidism, hyperlipidemia, GERD, coronary artery disease.  In some medical records, there is documentation of multiple sclerosis, although there are is an MRI in care everywhere with no evidence of demyelinating disease.  Patient presents with this, word finding difficulties, multiple falls over the past 3 days.  The dizziness and word finding difficulty started 3 days ago and the fall started yesterday.  She attributes the falls to increasing weakness of her legs.  No palliating or provoking factors.  She has hit her head and neck and has had increasing back pain because of the falls.  She does have an impending surgery with Dr. Ronnald Ramp in the near future.  Denies chest pain, shortness of breath, fevers, chills, nausea, vomiting.  No new changes in medications.    PT Comments    Patient able to pull to seated EOB requiring assist for generalized weakness. Patient initially requires support to remain seated but is able to maintain balance after several minutes independently. Patient completes seated exercises with difficulty completing on LLE secondary to c/o pain. Patient requires several attempts to transition to standing and remain standing. First attempt patient requires mod/max assist with LE trembling and is limited by fatigue, second attempt with mod A and use of RW patient able to remain standing without LE trembling. Patient able to ambulate in room with shuffled, unsteady steps with use of RW and assist. Patient ends session seated in chair with family present. Patient will benefit from continued physical therapy in hospital and recommended venue below to increase  strength, balance, endurance for safe ADLs and gait.    Follow Up Recommendations  SNF     Equipment Recommendations  Rolling walker with 5" wheels    Recommendations for Other Services       Precautions / Restrictions Precautions Precautions: Fall Restrictions Weight Bearing Restrictions: No    Mobility  Bed Mobility Overal bed mobility: Needs Assistance Bed Mobility: Supine to Sit     Supine to sit: Min assist;Mod assist;HOB elevated     General bed mobility comments: slow, labored transition to seated EOB requires assist to pull to seated with HOB elevated    Transfers Overall transfer level: Needs assistance Equipment used: Rolling walker (2 wheeled) Transfers: Sit to/from Stand Sit to Stand: Mod assist         General transfer comment: transfer to standing with RW, first attempt patient requires increased assist with LE trembling and is limited by fatigue, second attempt patient able to remain standing without trembling, requires use of RW for balance  Ambulation/Gait Ambulation/Gait assistance: Min assist;Mod assist Gait Distance (Feet): 15 Feet Assistive device: Rolling walker (2 wheeled) Gait Pattern/deviations: Shuffle Gait velocity: decreased   General Gait Details: able to ambulate several lateral steps at bedside with heavy RW use, able to ambulate in room with shuffled steps, frequent cueing for posture   Stairs             Wheelchair Mobility    Modified Rankin (Stroke Patients Only)       Balance  Cognition Arousal/Alertness: Awake/alert Behavior During Therapy: WFL for tasks assessed/performed Overall Cognitive Status: Within Functional Limits for tasks assessed                                        Exercises General Exercises - Lower Extremity Long Arc Quad: AROM;Both;10 reps;Seated Hip Flexion/Marching: AROM;Both;10 reps;Seated Toe Raises:  AROM;Both;10 reps;Seated Heel Raises: AROM;Both;10 reps;Seated    General Comments        Pertinent Vitals/Pain Pain Assessment: Faces Faces Pain Scale: Hurts little more Pain Location: lower back Pain Descriptors / Indicators: Discomfort;Grimacing;Guarding Pain Intervention(s): Limited activity within patient's tolerance;Monitored during session;Repositioned    Home Living                      Prior Function            PT Goals (current goals can now be found in the care plan section) Acute Rehab PT Goals Patient Stated Goal: return home PT Goal Formulation: With patient Time For Goal Achievement: 02/10/21 Potential to Achieve Goals: Good    Frequency    Min 3X/week      PT Plan Current plan remains appropriate    Co-evaluation              AM-PAC PT "6 Clicks" Mobility   Outcome Measure  Help needed turning from your back to your side while in a flat bed without using bedrails?: A Little Help needed moving from lying on your back to sitting on the side of a flat bed without using bedrails?: A Little Help needed moving to and from a bed to a chair (including a wheelchair)?: A Little Help needed standing up from a chair using your arms (e.g., wheelchair or bedside chair)?: A Little Help needed to walk in hospital room?: A Little Help needed climbing 3-5 steps with a railing? : A Lot 6 Click Score: 17    End of Session Equipment Utilized During Treatment: Gait belt Activity Tolerance: Patient tolerated treatment well;Patient limited by fatigue Patient left: in chair;with family/visitor present;with call bell/phone within reach Nurse Communication: Mobility status PT Visit Diagnosis: Unsteadiness on feet (R26.81);Other abnormalities of gait and mobility (R26.89);Muscle weakness (generalized) (M62.81)     Time: 8127-5170 PT Time Calculation (min) (ACUTE ONLY): 24 min  Charges:  $Therapeutic Exercise: 8-22 mins $Therapeutic Activity: 8-22  mins                    1:52 PM, 01/30/21 Mearl Latin PT, DPT Physical Therapist at [facility]

## 2021-01-30 NOTE — TOC Progression Note (Signed)
Transition of Care [facility]) - Progression Note    Patient Details  Name: [name] MRN: [id number] Date of Birth: 01/04/1966  Transition of Care [facility]) CM/SW Contact  Natasha Bence, LCSW Phone Number: 01/30/2021, 5:39 PM  Clinical Narrative:    CSW notified of insurances denial after peer to peer. CSW notified patient's son of option to appeal and or discharge with HH. Patient's son reported that patient had requested HH and that they would like to peruse [facility].CSW referred patient to Marjory Lies with [facility]. Marjory Lies agreeable to take patient. Marjory Lies also reported that patient would not be [facility] by [facility] until next weekend. CSW contacted patient's son and patient. Patient reported that she would like to discharge tomorrow and is agreeable to services beginning next weekend. Patient's son reported that he is in support of patient's decision.TOC to follow.    Expected Discharge Plan: [place] Barriers to Discharge: Insurance Authorization  Expected Discharge Plan and Services Expected Discharge Plan: [place] In-house Referral: NA Discharge Planning Services: CM Consult Post Acute Care Choice: [place] arrangements for the past 2 months: Single Family Home                 DME Arranged: N/A DME Agency: NA       HH Arranged: NA HH Agency: NA         Social Determinants of Health (SDOH) Interventions    Readmission Risk Interventions No flowsheet data found.

## 2021-01-30 NOTE — Progress Notes (Signed)
Patient Demographics:    [name], is a 55 y.o. female, DOB - Oct 27, 1965, NIO:[id number]  Admit date - 01/25/2021   Admitting Physician Courage Denton Brick, MD  Outpatient Primary MD for the patient is Redmond School, MD  LOS - 4   Chief Complaint  Patient presents with  . Dizziness        Subjective:    [name] was seen and examined this morning, pleasantly confused, refusing her medication in no distress cooperative.. Hemodynamically stable    Assessment  & Plan :    Principal Problem:   Acute CVA/punctate acute or subacute infarction in the lower Lt Paramedian Mid Brain Active Problems:   Hypothyroidism   Essential hypertension   QT prolongation   Addison's disease (HCC)   CAD (coronary artery disease)   GERD (gastroesophageal reflux disease)   Aphasia   Degeneration of lumbar intervertebral disc   Multiple falls   Acute metabolic encephalopathy  Brief Summary: 55 year old reformed smoker with past medical history relevant for Addison's disease, hypothyroidism, HTN, CAD, GERD and chronic back pain on chronic opiates admitted on 01/25/2021 with recurrent falls and aphasia and found to have acute stroke  A/p   1)Acute CVA/punctate acute or subacute infarction in the lower Lt Paramedian Mid Brain -Remained stable no new focal neurological findings  -CT head and brain MRI noted -EEG suggesting left temporal epileptic form discharges...  -Neurology seen and evaluated the patient, recommending antiplatelet mono therapy Regarding EEG findings recommending antiseizure medication Topamax 25 mg p.o. twice daily -PTA patient was on aspirin 81 mg daily, ?  Aspirin failure so give Aspirin 81 mg daily along with Plavix 75 mg daily for 30 days then after that STOP the aspirin and continue ONLY Plavix 75 mg daily indefinitely--for secondary stroke prevention  -PTA patient was on Lipitor 20 mg  daily increased to 40 mg daily -PT/OT and speech pathologist eval appreciated---recommends SNF  -Patient had MRA head and MRA neck on November 14, 2020 without LVO -Echo with preserved EF of 55 to 60% without wall motion abnormalities or significant valvular problems -Watch for arrhythmias on telemetry unit -A1c is 5.7,  -LDL is 54 with HDL of 50---Even if her lipid panel is within desired limits, patient should still take Lipitor for it's Pleiotropic effects (beyond cholesterol lowering benefits)  2) Aphasia/dysphagia--stable, no changes speech pathology eval appreciated  3) chronic back pain--- -Currently manageable with analgesics - patient has had numerous injections and procedures and prior surgeries for neck and lower back pain currently on opiates chronically  -patient was scheduled for surgery on 02/04/2021 this will have to be postponed for now given acute stroke and need for Plavix -Patient has urinary retention and worsening back pain we will get lumbar MRI this admission -given recent falls -Be judicious with opiates and muscle relaxants  4) Addison's disease- --awake, able to take oral intake will transition back to oral hydrocortisone at 20 mg twice daily -Hoping she will follow-up with PCP and endocrinology as an outpatient, to reduce hydrocortisone  5)GERD-Protonix as ordered  6)Hypothyroidism--- continue levothyroxine  7) acute metabolic encephalopathy/  -Confusion wax and wanes -Restarting home medication of Wellbutrin and Xanax at lower dose  -UA and chest x-ray without evidence of infection -UDS with opiates,  benzos and THC, patient is prescribed opiates and benzos -Altered mentation was most likely stroke related, awareness and mental status has improved significantly over the last 24 hours  8)Social/Ethics--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 -Plan of care discussed with patient's son Erlene Quan and patient's Sister Holley Raring,  - Patient  is a full code  9) urinary retention--- in and out catheterization with over 900 mL of urine, give Flomax, -Lumbar MRI as above #3  Disposition/Need for in-Hospital Stay- patient unable to be discharged at this time due to  -Met with TOC, social worker today continue to follow-up -acute CVA --- awaiting insurance authorization to go to [facility] rehab   6/38/7564   peer-to-peer was requested, patient is discharged was called at [phone] Information was provided-insurance to call back for peer-to-peer discussion  Status is: Inpatient  Remains inpatient appropriate because:please see disposition above   Disposition: The patient is from: Home              Anticipated d/c is to: SNF              Anticipated d/c date is: 2 days--              Patient currently is medically stable to d/c. Barriers: Awaiting insurance authorization  Code Status :  -  Code Status: Full Code   Family Communication:  discussed with patient's son Erlene Quan and patient's Sister Holley Raring, Consults  :  Neuro requested  DVT Prophylaxis  :   - SCDs  SCD's Start: 01/26/21 0126 enoxaparin (LOVENOX) injection 40 mg Start: 01/25/21 2100   Lab Results  Component Value Date   PLT 348 01/26/2021    Inpatient Medications  Scheduled Meds: . ALPRAZolam  0.25 mg Oral TID  . aspirin  81 mg Oral Daily  . atorvastatin  40 mg Oral Daily  . buPROPion  100 mg Oral BID  . carvedilol  6.25 mg Oral BID WC  . clopidogrel  75 mg Oral Daily  . enoxaparin (LOVENOX) injection  40 mg Subcutaneous Q24H  . gabapentin  600 mg Oral QID  . hydrocortisone  20 mg Oral BID  . levothyroxine  75 mcg Oral QAC breakfast  . polyethylene glycol  17 g Oral Daily  . senna-docusate  2 tablet Oral QHS  . tamsulosin  0.4 mg Oral QPC supper  . topiramate  25 mg Oral BID  . traZODone  50 mg Oral QHS   Continuous Infusions:  PRN Meds:.acetaminophen **OR** acetaminophen (TYLENOL) oral liquid 160 mg/5 mL **OR** acetaminophen, meclizine,  oxyCODONE    Anti-infectives (From admission, onward)   Start     Dose/Rate Route Frequency Ordered Stop   01/26/21 0600  vancomycin (VANCOCIN) IVPB 1000 mg/200 mL premix  Status:  Discontinued        1,000 mg 200 mL/hr over 60 Minutes Intravenous On call to O.R. 01/26/21 0125 01/26/21 1815        Objective:   Vitals:   01/29/21 1511 01/29/21 1952 01/29/21 2120 01/30/21 0352  BP: (!) 163/77  (!) 158/92 (!) 157/87  Pulse: 87  75 96  Resp: 17   18  Temp: 98.9 F (37.2 C)  97.7 F (36.5 C) 98.4 F (36.9 C)  TempSrc: Oral  Oral   SpO2: 99% 99% 100% 98%  Weight:        Wt Readings from Last 3 Encounters:  01/25/21 81 kg  11/13/20 80.7 kg  10/21/20 80.7 kg  Intake/Output Summary (Last 24 hours) at 01/30/2021 1150 Last data filed at 01/30/2021 2947 Gross per 24 hour  Intake 960 ml  Output 1050 ml  Net -90 ml      Physical Exam:   General:   Awake, pleasantly confused, but cooperative, no distress; dysarthric  HEENT:  Normocephalic, PERRL, otherwise with in Normal limits   Neuro:  CNII-XII intact. , normal motor and sensation, reflexes intact   Lungs:   Clear to auscultation BL, Respirations unlabored, no wheezes / crackles  Cardio:    S1/S2, RRR, No murmure, No Rubs or Gallops   Abdomen:   Soft, non-tender, bowel sounds active all four quadrants,  no guarding or peritoneal signs.  Muscular skeletal:  Limited exam - in bed, able to move all 4 extremities, Normal strength,  2+ pulses,  symmetric, No pitting edema  Skin:  Dry, warm to touch, negative for any Rashes,  Wounds: Please see nursing documentation                 Data Review:   Micro Results Recent Results (from the past 240 hour(s))  Resp Panel by RT-PCR (Flu A&B, Covid) Nasopharyngeal Swab     Status: None   Collection Time: 01/25/21  3:40 PM   Specimen: Nasopharyngeal Swab; Nasopharyngeal(NP) swabs in vial transport medium  Result Value Ref Range Status   SARS Coronavirus 2 by RT PCR  NEGATIVE NEGATIVE Final    Comment: (NOTE) SARS-CoV-2 target nucleic acids are NOT DETECTED.  The SARS-CoV-2 RNA is generally detectable in upper respiratory specimens during the acute phase of infection. The lowest concentration of SARS-CoV-2 viral copies this assay can detect is 138 copies/mL. A negative result does not preclude SARS-Cov-2 infection and should not be used as the sole basis for treatment or other patient management decisions. A negative result may occur with  improper specimen collection/handling, submission of specimen other than nasopharyngeal swab, presence of viral mutation(s) within the areas targeted by this assay, and inadequate number of viral copies(<138 copies/mL). A negative result must be combined with clinical observations, patient history, and epidemiological information. The expected result is Negative.  Fact Sheet for Patients:  EntrepreneurPulse.com.au  Fact Sheet for Healthcare Providers:  IncredibleEmployment.be  This test is no t yet approved or cleared by the [place] FDA and  has been authorized for detection and/or diagnosis of SARS-CoV-2 by FDA under an Emergency Use Authorization (EUA). This EUA will remain  in effect (meaning this test can be used) for the duration of the COVID-19 declaration under Section 564(b)(1) of the Act, 21 U.S.C.section 360bbb-3(b)(1), unless the authorization is terminated  or revoked sooner.       Influenza A by PCR NEGATIVE NEGATIVE Final   Influenza B by PCR NEGATIVE NEGATIVE Final    Comment: (NOTE) The Xpert Xpress SARS-CoV-2/FLU/RSV plus assay is intended as an aid in the diagnosis of influenza from Nasopharyngeal swab specimens and should not be used as a sole basis for treatment. Nasal washings and aspirates are unacceptable for Xpert Xpress SARS-CoV-2/FLU/RSV testing.  Fact Sheet for Patients: EntrepreneurPulse.com.au  Fact Sheet for  Healthcare Providers: IncredibleEmployment.be  This test is not yet approved or cleared by the [place] FDA and has been authorized for detection and/or diagnosis of SARS-CoV-2 by FDA under an Emergency Use Authorization (EUA). This EUA will remain in effect (meaning this test can be used) for the duration of the COVID-19 declaration under Section 564(b)(1) of the Act, 21 U.S.C. section 360bbb-3(b)(1), unless the authorization is  terminated or revoked.  Performed at [facility], [address] [place], [place] [address]   Urine Culture     Status: Abnormal   Collection Time: 01/27/21  6:30 PM   Specimen: Urine, Catheterized  Result Value Ref Range Status   Specimen Description   Final    URINE, CATHETERIZED Performed at [facility], [address], [place], [place] [address]    Special Requests   Final    Normal Performed at [facility], [address] [place], [place] [address]    Culture (A)  Final    <10,000 COLONIES/mL INSIGNIFICANT GROWTH Performed at [facility] [address], [place],  [address]    Report Status 01/29/2021 FINAL  Final    Radiology Reports CT ABDOMEN PELVIS WO CONTRAST  Result Date: 01/25/2021 CLINICAL DATA:  Fall.  Abdominal trauma. EXAM: CT ABDOMEN AND PELVIS WITHOUT CONTRAST TECHNIQUE: Multidetector CT imaging of the abdomen and pelvis was performed following the standard protocol without IV contrast. COMPARISON:  10/24/2019 FINDINGS: Lower chest: No acute abnormality. Hepatobiliary: Prior cholecystectomy. No focal hepatic abnormality or evidence of perihepatic hematoma. Pancreas: No focal abnormality or ductal dilatation. Spleen: No splenic injury or perisplenic hematoma. Adrenals/Urinary Tract: No adrenal hemorrhage or renal injury identified. Bladder is unremarkable. Stomach/Bowel: Moderate stool burden throughout the colon. Stomach, large and small bowel grossly unremarkable. Normal appendix.  Vascular/Lymphatic: Aortic atherosclerosis. No evidence of aneurysm or adenopathy. Reproductive: Prior hysterectomy.  No adnexal masses. Other: No free fluid or free air. Musculoskeletal: No acute bony abnormality. IMPRESSION: No acute findings in the abdomen or pelvis. Aortic atherosclerosis. Moderate stool burden. Electronically Signed   By: Rolm Baptise M.D.   On: 01/25/2021 14:46   Chest 2 View  Result Date: 01/26/2021 CLINICAL DATA:  Dizziness and weakness EXAM: CHEST - 2 VIEW COMPARISON:  01/10/2020 FINDINGS: The heart size and mediastinal contours are within normal limits. Both lungs are clear. The visualized skeletal structures are unremarkable. IMPRESSION: No active cardiopulmonary disease. Electronically Signed   By: Ulyses Jarred M.D.   On: 01/26/2021 02:31   CT HEAD WO CONTRAST  Result Date: 01/25/2021 CLINICAL DATA:  Neuro deficit. Acute stroke suspected. Dizziness and weakness. Ten falls yesterday. Pain. EXAM: CT HEAD WITHOUT CONTRAST CT CERVICAL SPINE WITHOUT CONTRAST TECHNIQUE: Multidetector CT imaging of the head and cervical spine was performed following the standard protocol without intravenous contrast. Multiplanar CT image reconstructions of the cervical spine were also generated. COMPARISON:  None. FINDINGS: CT HEAD FINDINGS Brain: No subdural, epidural, or subarachnoid hemorrhage. Cerebellum, brainstem, and basal cisterns are normal. Ventricles and sulci are unremarkable. No mass effect or midline shift. No acute cortical ischemia or infarct. Vascular: No hyperdense vessel or unexpected calcification. Skull: Normal. Negative for fracture or focal lesion. Sinuses/Orbits: No acute finding. Other: None. CT CERVICAL SPINE FINDINGS Alignment: Evaluation is somewhat limited due to motion. Within the limitations of motion, no traumatic malalignment is identified. Skull base and vertebrae: No acute fracture. No primary bone lesion or focal pathologic process. Soft tissues and spinal canal: No  prevertebral fluid or swelling. No visible canal hematoma. Disc levels: Patient is status post anterior plate screw fusion C5 through C7. Hardware is in good position. Mild degenerative disc disease with small anterior and posterior osteophytes. Upper chest: Negative. Other: No other abnormalities. IMPRESSION: 1. No acute intracranial abnormalities identified. 2. Evaluation of the cervical spine is mildly limited by motion. Within these limitations, no fracture or traumatic malalignment identified. Electronically Signed   By: Shanon Brow  Mee Hives M.D   On: 01/25/2021 14:56   CT Cervical Spine Wo Contrast  Result Date: 01/25/2021 CLINICAL DATA:  Neuro deficit. Acute stroke suspected. Dizziness and weakness. Ten falls yesterday. Pain. EXAM: CT HEAD WITHOUT CONTRAST CT CERVICAL SPINE WITHOUT CONTRAST TECHNIQUE: Multidetector CT imaging of the head and cervical spine was performed following the standard protocol without intravenous contrast. Multiplanar CT image reconstructions of the cervical spine were also generated. COMPARISON:  None. FINDINGS: CT HEAD FINDINGS Brain: No subdural, epidural, or subarachnoid hemorrhage. Cerebellum, brainstem, and basal cisterns are normal. Ventricles and sulci are unremarkable. No mass effect or midline shift. No acute cortical ischemia or infarct. Vascular: No hyperdense vessel or unexpected calcification. Skull: Normal. Negative for fracture or focal lesion. Sinuses/Orbits: No acute finding. Other: None. CT CERVICAL SPINE FINDINGS Alignment: Evaluation is somewhat limited due to motion. Within the limitations of motion, no traumatic malalignment is identified. Skull base and vertebrae: No acute fracture. No primary bone lesion or focal pathologic process. Soft tissues and spinal canal: No prevertebral fluid or swelling. No visible canal hematoma. Disc levels: Patient is status post anterior plate screw fusion C5 through C7. Hardware is in good position. Mild degenerative disc  disease with small anterior and posterior osteophytes. Upper chest: Negative. Other: No other abnormalities. IMPRESSION: 1. No acute intracranial abnormalities identified. 2. Evaluation of the cervical spine is mildly limited by motion. Within these limitations, no fracture or traumatic malalignment identified. Electronically Signed   By: Dorise Bullion III M.D   On: 01/25/2021 14:56   MR BRAIN WO CONTRAST  Result Date: 01/26/2021 CLINICAL DATA:  Acute stroke presentation. Dizziness and weakness with falling. EXAM: MRI HEAD WITHOUT CONTRAST TECHNIQUE: Multiplanar, multiecho pulse sequences of the brain and surrounding structures were obtained without intravenous contrast. COMPARISON:  Head CT yesterday.  MRI 11/14/2020 FINDINGS: Brain: Newly seen punctate acute or subacute infarction in the lower left paramedian mid brain. No other acute finding. The brainstem and cerebellum appear otherwise normal. Cerebral hemispheres show a few punctate foci of T2 and FLAIR signal within the deep and subcortical white matter, often seen at this age. No cortical or large vessel territory infarction. No mass lesion, hemorrhage, hydrocephalus or extra-axial collection. Vascular: Major vessels at the base of the brain show flow. Skull and upper cervical spine: Negative Sinuses/Orbits: Clear/normal Other: None IMPRESSION: Subtle evidence of a punctate acute infarction in the lower left paramedian mid brain. No finding was present in this location on the study of 11/14/2020. The remainder the brain is essentially normal for age, with a few punctate foci of T2 and FLAIR signal within the cerebral hemispheric white matter as seen previously. Electronically Signed   By: Nelson Chimes M.D.   On: 01/26/2021 11:26   MR LUMBAR SPINE WO CONTRAST  Result Date: 01/28/2021 CLINICAL DATA:  Low back pain, technologist note states urinary retention and worsening chronic back pain EXAM: MRI LUMBAR SPINE WITHOUT CONTRAST TECHNIQUE:  Multiplanar, multisequence MR imaging of the lumbar spine was performed. No intravenous contrast was administered. COMPARISON:  10/20/2020 FINDINGS: Segmentation:  Standard. Alignment:  Anteroposterior alignment is maintained. Vertebrae: Marrow edema associated with L4-L5 facet arthropathy. Vertebral body heights are maintained. No suspicious osseous lesion. Conus medullaris and cauda equina: Conus extends to the L1 level. Conus and cauda equina appear normal. Paraspinal and other soft tissues: Paraspinal edema associated with L4-L5 facet arthropathy. Disc levels: L1-L2:  No stenosis. L2-L3:  No stenosis. L3-L4: Prior left laminectomy. Disc bulge with increased size of left subarticular/foraminal annular  fissure and shallow protrusion. Facet arthropathy with residual right ligamentum flavum infolding. No canal stenosis. Unchanged slight narrowing of the subarticular recesses. Unchanged minor right foraminal stenosis. Slightly increased mild to moderate left foraminal stenosis. L4-L5: Prior left laminectomy. Disc bulge eccentric to the left. Marked facet arthropathy with increased joint effusions. There is a more cystic-appearing 9 mm synovial cyst along the left lateral aspect of the spinal canal. Residual ligamentum flavum infolding. Similar mild canal stenosis. Similar partial effacement the right subarticular recess. Persistent effacement of the left subarticular recess with compression of traversing L5 nerve roots. Similar mild right and moderate left foraminal stenosis. L5-S1: Similar moderate right and mild left facet arthropathy. No canal or foraminal stenosis. IMPRESSION: Degenerative and prior postoperative changes as detailed above. There is no high-grade canal stenosis. Increased facet arthropathy at L4-L5 with marrow and soft tissue edema, fusions, and intraspinal synovial cyst compressing the traversing left L5 nerve roots. Slightly increased disc protrusion/annular fissure and left foraminal stenosis  at L3-L4. Electronically Signed   By: Macy Mis M.D.   On: 01/28/2021 12:01   EEG adult  Result Date: 01/28/2021 Lora Havens, MD     01/28/2021  8:41 AM Patient Name: [name] MRN: [id number] Epilepsy Attending: Lora Havens Referring Physician/Provider: Dr Roxan Hockey Date: 01/27/2021 Duration: 23.30 mins Patient history: 55 year old female with altered mental status.  EEG to evaluate for seizures. Level of alertness: Awake AEDs during EEG study: Topiramate, zonisamide, gabapentin Technical aspects: This EEG study was done with scalp electrodes positioned according to the 10-20 International system of electrode placement. Electrical activity was acquired at a sampling rate of _0  and reviewed with a high frequency filter of _1  and a low frequency filter of _2 . EEG data were recorded continuously and digitally stored. Description: The posterior dominant rhythm consists of 9-10 Hz activity of moderate voltage (25-35 uV) seen predominantly in posterior head regions, symmetric and reactive to eye opening and eye closing. Hyperventilation and photic stimulation were not performed.   IMPRESSION: This study is within normal limits. No seizures or epileptiform discharges were seen throughout the recording. Lora Havens   MM 3D SCREEN BREAST BILATERAL  Result Date: 01/12/2021 CLINICAL DATA:  Screening. EXAM: DIGITAL SCREENING BILATERAL MAMMOGRAM WITH TOMOSYNTHESIS AND CAD TECHNIQUE: Bilateral screening digital craniocaudal and mediolateral oblique mammograms were obtained. Bilateral screening digital breast tomosynthesis was performed. The images were evaluated with computer-aided detection. COMPARISON:  Previous exam(s). ACR Breast Density Category b: There are scattered areas of fibroglandular density. FINDINGS: There are no findings suspicious for malignancy. The images were evaluated with computer-aided detection. IMPRESSION: No mammographic evidence of malignancy. A result letter  of this screening mammogram will be mailed directly to the patient. RECOMMENDATION: Screening mammogram in one year. (Code:SM-B-01Y) BI-RADS CATEGORY  1: Negative. Electronically Signed   By: Nolon Nations M.D.   On: 01/12/2021 15:42   ECHOCARDIOGRAM LIMITED  Result Date: 01/26/2021    ECHOCARDIOGRAM LIMITED REPORT   Patient Name:   KOBI ALLER Date of Exam: 01/26/2021 Medical Rec #:  [id number]      Height:       65.5 in Accession #:    [id number]     Weight:       178.6 lb Date of Birth:  [birth year]       BSA:          1.896 m Patient Age:    62 years       BP:  175/101 mmHg Patient Gender: F              HR:           90 bpm. Exam Location:  [facility] Procedure: Limited Echo Indications:    Stroke I63.9  History:        Patient has prior history of Echocardiogram examinations, most                 recent 10/08/2020. CAD, Signs/Symptoms:Chest Pain; Risk                 Factors:Former Smoker and Hypertension. GERD, Mitral Valve                 Prolapse.  Sonographer:    Leavy Cella RDCS (AE) Referring Phys: [id number] COURAGE EMOKPAE IMPRESSIONS  1. Left ventricular ejection fraction, by estimation, is 55 to 60%. The left ventricle has normal function. The left ventricle has no regional wall motion abnormalities. There is mild left ventricular hypertrophy.  2. Right ventricular systolic function is normal. The right ventricular size is normal.  3. The mitral valve is normal in structure. No evidence of mitral valve regurgitation.  4. Aortic valve regurgitation is not visualized. Mild aortic valve sclerosis is present, with no evidence of aortic valve stenosis.  5. The inferior vena cava is normal in size with greater than 50% respiratory variability, suggesting right atrial pressure of 3 mmHg. Comparison(s): The left ventricular function is unchanged. FINDINGS  Left Ventricle: Left ventricular ejection fraction, by estimation, is 55 to 60%. The left ventricle has normal function. The left ventricle  has no regional wall motion abnormalities. The left ventricular internal cavity size was normal in size. There is  mild left ventricular hypertrophy. Right Ventricle: The right ventricular size is normal. Right vetricular wall thickness was not assessed. Right ventricular systolic function is normal. Left Atrium: Left atrial size was normal in size. Right Atrium: Right atrial size was normal in size. Pericardium: There is no evidence of pericardial effusion. Mitral Valve: The mitral valve is normal in structure. Tricuspid Valve: The tricuspid valve is normal in structure. Tricuspid valve regurgitation is not demonstrated. Aortic Valve: Aortic valve regurgitation is not visualized. Mild aortic valve sclerosis is present, with no evidence of aortic valve stenosis. Pulmonic Valve: The pulmonic valve was not well visualized. Aorta: The aortic root is normal in size and structure. Venous: The inferior vena cava is normal in size with greater than 50% respiratory variability, suggesting right atrial pressure of 3 mmHg. LEFT VENTRICLE PLAX 2D LVIDd:         4.01 cm  Diastology LVIDs:         2.88 cm  LV e' medial:    5.34 cm/s LV PW:         1.18 cm  LV E/e' medial:  11.1 LV IVS:        1.31 cm  LV e' lateral:   11.00 cm/s LVOT diam:     1.90 cm  LV E/e' lateral: 5.4 LVOT Area:     2.84 cm  LEFT ATRIUM           Index LA diam:      3.30 cm 1.74 cm/m LA Vol (A4C): 26.0 ml 13.71 ml/m   AORTA Ao Root diam: 2.80 cm MITRAL VALVE MV Area (PHT): 4.21 cm     SHUNTS MV Decel Time: 180 msec     Systemic Diam: 1.90 cm MV E velocity: 59.10 cm/s MV A velocity:  104.00 cm/s MV E/A ratio:  0.57 Dorris Carnes MD Electronically signed by Dorris Carnes MD Signature Date/Time: 01/26/2021/5:30:21 PM    Final      CBC Recent Labs  Lab 01/25/21 1451 01/25/21 1505 01/26/21 0409  WBC 11.5*  --  11.3*  HGB 17.2* 18.4* 16.6*  HCT 53.4* 54.0* 50.8*  PLT 342  --  348  MCV 90.4  --  89.9  MCH 29.1  --  29.4  MCHC 32.2  --  32.7  RDW 14.6   --  14.6  LYMPHSABS 2.4  --  2.5  MONOABS 1.2*  --  1.1*  EOSABS 0.2  --  0.1  BASOSABS 0.1  --  0.1    Chemistries  Recent Labs  Lab 01/25/21 1451 01/25/21 1505 01/26/21 0409 01/27/21 0448  NA 141 145 140 137  K 3.5 3.6 3.7 3.8  CL 105 107 106 104  CO2 26  --  22 23  GLUCOSE 93 91 113* 122*  BUN _0 33*  CREATININE 1.09* 1.10* 1.15* 1.29*  CALCIUM 9.7  --  9.3 8.7*  AST 21  --   --   --   ALT 17  --   --   --   ALKPHOS 87  --   --   --   BILITOT 0.9  --   --   --    ------------------------------------------------------------------------------------------------------------------ No results for input(s): CHOL, HDL, LDLCALC, TRIG, CHOLHDL, LDLDIRECT in the last 72 hours.  Lab Results  Component Value Date   HGBA1C 5.7 (H) 01/26/2021   ------------------------------------------------------------------------------------------------------------------ No results for input(s): TSH, T4TOTAL, T3FREE, THYROIDAB in the last 72 hours.  Invalid input(s): FREET3 ------------------------------------------------------------------------------------------------------------------ No results for input(s): VITAMINB12, FOLATE, FERRITIN, TIBC, IRON, RETICCTPCT in the last 72 hours.  Coagulation profile Recent Labs  Lab 01/25/21 1451 01/26/21 0409  INR 1.0 1.1    No results for input(s): DDIMER in the last 72 hours.  Cardiac Enzymes No results for input(s): CKMB, TROPONINI, MYOGLOBIN in the last 168 hours.  Invalid input(s): CK ------------------------------------------------------------------------------------------------------------------    Component Value Date/Time   BNP 59.0 08/05/2019 1958     Deatra James M.D on 01/30/2021 at 11:50 AM  Go to [email or web address] - for contact info  [facility] - Office  [phone]

## 2021-01-31 DIAGNOSIS — I10 Essential (primary) hypertension: Secondary | ICD-10-CM | POA: Diagnosis not present

## 2021-01-31 LAB — GLUCOSE, CAPILLARY: Glucose-Capillary: 130 mg/dL — ABNORMAL HIGH (ref 70–99)

## 2021-01-31 MED ORDER — TOPIRAMATE 25 MG PO TABS: 25.0000 mg | ORAL_TABLET | Freq: Two times a day (BID) | ORAL | 1 refills | Status: DC

## 2021-01-31 MED ORDER — TIZANIDINE HCL 4 MG PO TABS: 4.0000 mg | ORAL_TABLET | Freq: Four times a day (QID) | ORAL | 0 refills | Status: AC | PRN

## 2021-01-31 MED ORDER — ATORVASTATIN CALCIUM 40 MG PO TABS: 40.0000 mg | ORAL_TABLET | Freq: Every day | ORAL | 3 refills | Status: DC

## 2021-01-31 MED ORDER — CLOPIDOGREL BISULFATE 75 MG PO TABS: 75.0000 mg | ORAL_TABLET | Freq: Every day | ORAL | 1 refills | Status: DC

## 2021-01-31 MED ORDER — ASPIRIN 81 MG PO CHEW: 81.0000 mg | CHEWABLE_TABLET | Freq: Every day | ORAL | 0 refills | Status: AC

## 2021-01-31 MED ORDER — CARVEDILOL 6.25 MG PO TABS: 6.2500 mg | ORAL_TABLET | Freq: Two times a day (BID) | ORAL | 3 refills | Status: DC

## 2021-01-31 MED ORDER — CLOPIDOGREL BISULFATE 75 MG PO TABS: 75.0000 mg | ORAL_TABLET | Freq: Every day | ORAL | 2 refills | Status: AC

## 2021-01-31 MED ORDER — TAMSULOSIN HCL 0.4 MG PO CAPS: 0.4000 mg | ORAL_CAPSULE | Freq: Every day | ORAL | 1 refills | Status: AC

## 2021-01-31 NOTE — Discharge Summary (Addendum)
Physician Discharge Summary [facility]    Patient: [name]                   Admit date: 01/25/2021   DOB: [birth year]             Discharge date:01/31/2021/12:21 PM VHQ:[id number]                          PCP: Redmond School, MD  Disposition: HOME with Home Health   Recommendations for Outpatient Follow-up:   . Follow up: Follow-up with neurologist in 1-2 weeks . Follow-up with PCP in 1-2 weeks . Follow-up with a psychiatrist in 1-2 weeks  Discharge Condition: Stable   Code Status:   Code Status: Full Code  Diet recommendation: Cardiac diet   Discharge Diagnoses:    Principal Problem:   Acute CVA/punctate acute or subacute infarction in the lower Lt Paramedian Mid Brain Active Problems:   Hypothyroidism   Essential hypertension   QT prolongation   Addison's disease ([place])   CAD (coronary artery disease)   GERD (gastroesophageal reflux disease)   Aphasia   Degeneration of lumbar intervertebral disc   Multiple falls   Acute metabolic encephalopathy   History of Present Illness/ Hospital Course Kathleen Argue Summary:    Brief Summary: 55 year old reformed smoker with past medical history relevant for Addison's disease, hypothyroidism, HTN, CAD, GERD and chronic back pain on chronic opiates admitted on 01/25/2021 with recurrent falls and aphasia and found to have acute stroke    1)Acute CVA/punctate acute or subacute infarction in the lower Lt Paramedian Mid Brain -Remained stable no new focal neurological findings  -CT head and brain MRI noted -EEG suggesting left temporal epileptic form discharges...  -Neurology seen and evaluated the patient, recommending antiplatelet mono therapy Regarding EEG findings recommending antiseizure medication Topamax 25 mg p.o. twice daily -PTA patient was on aspirin 81 mg daily, ?  Aspirin failure so give Aspirin 81 mg daily along with Plavix 75 mg daily for 30 days then after that STOP the aspirin and continue ONLY  Plavix 75 mg daily indefinitely--for secondary stroke prevention  -PTA patient was on Lipitor 20 mg daily increased to 40 mg daily -PT/OT and speech pathologist eval appreciated---recommends SNF  -Patient had MRA head and MRA neck on November 14, 2020 without LVO -Echo with preserved EF of 55 to 60% without wall motion abnormalities or significant valvular problems -Watch for arrhythmias on telemetry unit -A1c is 5.7,  -LDL is 54 with HDL of 50---Even if her lipid panel is within desired limits, patient should still take Lipitor for it's Pleiotropic effects (beyond cholesterol lowering benefits)  2) Aphasia/dysphagia--stable, no changes speech pathology eval appreciated  3) chronic back pain--- -Currently manageable with analgesics - patient has had numerous injections and procedures and prior surgeries for neck and lower back pain currently on opiates chronically  -patient was scheduled for surgery on 02/04/2021 this will have to be postponed for now given acute stroke and need for Plavix -Patient has urinary retention and worsening back pain we will get lumbar MRI this admission -given recent falls -Be judicious with opiates and muscle relaxants  4) Addison's disease- --awake, able to take oral intake will transition back to oral hydrocortisone at 20 mg twice daily -Hoping she will follow-up with PCP and endocrinology as an outpatient, to reduce hydrocortisone  5)GERD-Protonix as ordered  6)Hypothyroidism--- continue levothyroxine  7) acute metabolic encephalopathy/  -Confusion wax and wanes -Restarting home  medication of Wellbutrin and Xanax at lower dose  -UA and chest x-ray without evidence of infection -UDS with opiates, benzos and THC, patient is prescribed opiates and benzos -Altered mentation was most likely stroke related, awareness and mental status has improved significantly over the last 24 hours  8)Social/Ethics--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  01/30/2021   peer-to-peer was requested, patient is discharged was called at [phone] Information was provided-insurance to call back for peer-to-peer discussion     Disposition: The patient is from: Home  Anticipated d/c is to: HOME With home health   Discharge Instructions:   Discharge Instructions    Activity as tolerated - No restrictions   Complete by: As directed    Call MD for:  difficulty breathing, headache or visual disturbances   Complete by: As directed    Call MD for:  persistant dizziness or light-headedness   Complete by: As directed    Call MD for:  persistant nausea and vomiting   Complete by: As directed    Call MD for:  temperature >100.4   Complete by: As directed    Diet - low sodium heart healthy   Complete by: As directed    Discharge instructions   Complete by: As directed    Follow-up with neurologist and PCP in 1 to 2 weeks.  Continue current recommended medications. Recommending oral hydration   Increase activity slowly   Complete by: As directed        Medication List    STOP taking these medications   amLODipine 5 MG tablet Commonly known as: NORVASC   BACLOFEN PO   furosemide 20 MG tablet Commonly known as: LASIX   nitrofurantoin (macrocrystal-monohydrate) 100 MG capsule Commonly known as: MACROBID   zolpidem 10 MG tablet Commonly known as: AMBIEN     TAKE these medications   albuterol 108 (90 Base) MCG/ACT inhaler Commonly known as: VENTOLIN HFA Inhale 1-2 puffs into the lungs every 6 (six) hours as needed for wheezing or shortness of breath.   alendronate 70 MG  tablet Commonly known as: FOSAMAX Take 70 mg by mouth once a week.   ALPRAZolam 0.5 MG tablet Commonly known as: XANAX Take 0.5 mg by mouth 3 (three) times daily.   aspirin 81 MG chewable tablet Chew 1 tablet (81 mg total) by mouth daily.   atorvastatin 40 MG tablet Commonly known as: LIPITOR Take 1 tablet (40 mg total) by mouth daily. What changed:   medication strength  how much to take   buPROPion 150 MG 12 hr tablet Commonly known as: WELLBUTRIN SR Take 150 mg by mouth 2 (two) times daily.   carvedilol 6.25 MG tablet Commonly known as: COREG Take 1 tablet (6.25 mg total) by mouth 2 (two) times daily with a meal.   cholecalciferol 1000 units tablet Commonly known as: VITAMIN D Take 1,000 Units by mouth daily.   clopidogrel 75 MG tablet Commonly known as: PLAVIX Take 1 tablet (75 mg total) by mouth daily.   gabapentin 600 MG tablet Commonly known as: NEURONTIN Take 600 mg by mouth 4 (four) times daily.   hydrocortisone 10 MG tablet Commonly known  as: CORTEF Take 10-20 mg by mouth See admin instructions. Takes 20 mg in the morning and 10 mg at night   levothyroxine 75 MCG tablet Commonly known as: SYNTHROID Take 75 mcg by mouth daily before breakfast.   meclizine 25 MG tablet Commonly known as: ANTIVERT 1 or 2 tabs PO q8h prn dizziness What changed:   how much to take  how to take this  when to take this  reasons to take this  additional instructions   nitroGLYCERIN 0.4 MG SL tablet Commonly known as: NITROSTAT Place 1 tablet (0.4 mg total) under the tongue every 5 (five) minutes as needed for chest pain.   ondansetron 4 MG disintegrating tablet Commonly known as: Zofran ODT Take 1 tablet (4 mg total) by mouth every 8 (eight) hours as needed for nausea.   oxyCODONE 15 MG immediate release tablet Commonly known as: ROXICODONE Take 15 mg by mouth every 4 (four) hours as needed for pain.   tamsulosin 0.4 MG Caps capsule Commonly known as:  FLOMAX Take 1 capsule (0.4 mg total) by mouth daily after supper.   tiZANidine 4 MG tablet Commonly known as: ZANAFLEX Take 1 tablet (4 mg total) by mouth every 6 (six) hours as needed for up to 10 days. What changed: when to take this   topiramate 25 MG tablet Commonly known as: TOPAMAX Take 1 tablet (25 mg total) by mouth 2 (two) times daily.       Follow-up Information    Health, May Follow up.   Specialty: [place] Why: PT, OT, RN, SW Contact information: [place] [address] [phone]              Allergies  Allergen Reactions  . Bee Venom Anaphylaxis  . Doxycycline Other (See Comments) and Nausea Only    Due to Pre-Existing conditions involved with stomach, patient does not take the following medication  . Erythromycin Other (See Comments) and Nausea And Vomiting    Due to Pre-Existing conditions involved with stomach, patient does not take the following medication  . Ibuprofen Nausea Only    gastroparesis   . Penicillins Anaphylaxis and Swelling    Has patient had a PCN reaction causing immediate rash, facial/tongue/throat swelling, SOB or lightheadedness with hypotension: Yes Has patient had a PCN reaction causing severe rash involving mucus membranes or skin necrosis: Yes Has patient had a PCN reaction that required hospitalization No Has patient had a PCN reaction occurring within the last 10 years: No If all of the above answers are "NO", then may proceed with Cephalosporin use.   . Sulfa Antibiotics Hives and Itching  . Cat Hair Extract Hives, Itching and Swelling    SWELLING REACTION UNSPECIFIED   . Dust Mite Extract Hives and Swelling  . Tramadol Diarrhea, Nausea And Vomiting and Nausea Only  . Morphine And Related Itching     Procedures /Studies:   CT ABDOMEN PELVIS WO CONTRAST  Result Date: 01/25/2021 CLINICAL DATA:  Fall.  Abdominal trauma. EXAM: CT ABDOMEN AND PELVIS WITHOUT CONTRAST  TECHNIQUE: Multidetector CT imaging of the abdomen and pelvis was performed following the standard protocol without IV contrast. COMPARISON:  10/24/2019 FINDINGS: Lower chest: No acute abnormality. Hepatobiliary: Prior cholecystectomy. No focal hepatic abnormality or evidence of perihepatic hematoma. Pancreas: No focal abnormality or ductal dilatation. Spleen: No splenic injury or perisplenic hematoma. Adrenals/Urinary Tract: No adrenal hemorrhage or renal injury identified. Bladder is unremarkable. Stomach/Bowel: Moderate stool burden throughout the colon.  Stomach, large and small bowel grossly unremarkable. Normal appendix. Vascular/Lymphatic: Aortic atherosclerosis. No evidence of aneurysm or adenopathy. Reproductive: Prior hysterectomy.  No adnexal masses. Other: No free fluid or free air. Musculoskeletal: No acute bony abnormality. IMPRESSION: No acute findings in the abdomen or pelvis. Aortic atherosclerosis. Moderate stool burden. Electronically Signed   By: Rolm Baptise M.D.   On: 01/25/2021 14:46   Chest 2 View  Result Date: 01/26/2021 CLINICAL DATA:  Dizziness and weakness EXAM: CHEST - 2 VIEW COMPARISON:  01/10/2020 FINDINGS: The heart size and mediastinal contours are within normal limits. Both lungs are clear. The visualized skeletal structures are unremarkable. IMPRESSION: No active cardiopulmonary disease. Electronically Signed   By: Ulyses Jarred M.D.   On: 01/26/2021 02:31   CT HEAD WO CONTRAST  Result Date: 01/25/2021 CLINICAL DATA:  Neuro deficit. Acute stroke suspected. Dizziness and weakness. Ten falls yesterday. Pain. EXAM: CT HEAD WITHOUT CONTRAST CT CERVICAL SPINE WITHOUT CONTRAST TECHNIQUE: Multidetector CT imaging of the head and cervical spine was performed following the standard protocol without intravenous contrast. Multiplanar CT image reconstructions of the cervical spine were also generated. COMPARISON:  None. FINDINGS: CT HEAD FINDINGS Brain: No subdural, epidural, or  subarachnoid hemorrhage. Cerebellum, brainstem, and basal cisterns are normal. Ventricles and sulci are unremarkable. No mass effect or midline shift. No acute cortical ischemia or infarct. Vascular: No hyperdense vessel or unexpected calcification. Skull: Normal. Negative for fracture or focal lesion. Sinuses/Orbits: No acute finding. Other: None. CT CERVICAL SPINE FINDINGS Alignment: Evaluation is somewhat limited due to motion. Within the limitations of motion, no traumatic malalignment is identified. Skull base and vertebrae: No acute fracture. No primary bone lesion or focal pathologic process. Soft tissues and spinal canal: No prevertebral fluid or swelling. No visible canal hematoma. Disc levels: Patient is status post anterior plate screw fusion C5 through C7. Hardware is in good position. Mild degenerative disc disease with small anterior and posterior osteophytes. Upper chest: Negative. Other: No other abnormalities. IMPRESSION: 1. No acute intracranial abnormalities identified. 2. Evaluation of the cervical spine is mildly limited by motion. Within these limitations, no fracture or traumatic malalignment identified. Electronically Signed   By: Dorise Bullion III M.D   On: 01/25/2021 14:56   CT Cervical Spine Wo Contrast  Result Date: 01/25/2021 CLINICAL DATA:  Neuro deficit. Acute stroke suspected. Dizziness and weakness. Ten falls yesterday. Pain. EXAM: CT HEAD WITHOUT CONTRAST CT CERVICAL SPINE WITHOUT CONTRAST TECHNIQUE: Multidetector CT imaging of the head and cervical spine was performed following the standard protocol without intravenous contrast. Multiplanar CT image reconstructions of the cervical spine were also generated. COMPARISON:  None. FINDINGS: CT HEAD FINDINGS Brain: No subdural, epidural, or subarachnoid hemorrhage. Cerebellum, brainstem, and basal cisterns are normal. Ventricles and sulci are unremarkable. No mass effect or midline shift. No acute cortical ischemia or infarct.  Vascular: No hyperdense vessel or unexpected calcification. Skull: Normal. Negative for fracture or focal lesion. Sinuses/Orbits: No acute finding. Other: None. CT CERVICAL SPINE FINDINGS Alignment: Evaluation is somewhat limited due to motion. Within the limitations of motion, no traumatic malalignment is identified. Skull base and vertebrae: No acute fracture. No primary bone lesion or focal pathologic process. Soft tissues and spinal canal: No prevertebral fluid or swelling. No visible canal hematoma. Disc levels: Patient is status post anterior plate screw fusion C5 through C7. Hardware is in good position. Mild degenerative disc disease with small anterior and posterior osteophytes. Upper chest: Negative. Other: No other abnormalities. IMPRESSION: 1. No acute intracranial abnormalities identified.  2. Evaluation of the cervical spine is mildly limited by motion. Within these limitations, no fracture or traumatic malalignment identified. Electronically Signed   By: Dorise Bullion III M.D   On: 01/25/2021 14:56   MR BRAIN WO CONTRAST  Result Date: 01/26/2021 CLINICAL DATA:  Acute stroke presentation. Dizziness and weakness with falling. EXAM: MRI HEAD WITHOUT CONTRAST TECHNIQUE: Multiplanar, multiecho pulse sequences of the brain and surrounding structures were obtained without intravenous contrast. COMPARISON:  Head CT yesterday.  MRI 11/14/2020 FINDINGS: Brain: Newly seen punctate acute or subacute infarction in the lower left paramedian mid brain. No other acute finding. The brainstem and cerebellum appear otherwise normal. Cerebral hemispheres show a few punctate foci of T2 and FLAIR signal within the deep and subcortical white matter, often seen at this age. No cortical or large vessel territory infarction. No mass lesion, hemorrhage, hydrocephalus or extra-axial collection. Vascular: Major vessels at the base of the brain show flow. Skull and upper cervical spine: Negative Sinuses/Orbits: Clear/normal  Other: None IMPRESSION: Subtle evidence of a punctate acute infarction in the lower left paramedian mid brain. No finding was present in this location on the study of 11/14/2020. The remainder the brain is essentially normal for age, with a few punctate foci of T2 and FLAIR signal within the cerebral hemispheric white matter as seen previously. Electronically Signed   By: Nelson Chimes M.D.   On: 01/26/2021 11:26   MR LUMBAR SPINE WO CONTRAST  Result Date: 01/28/2021 CLINICAL DATA:  Low back pain, technologist note states urinary retention and worsening chronic back pain EXAM: MRI LUMBAR SPINE WITHOUT CONTRAST TECHNIQUE: Multiplanar, multisequence MR imaging of the lumbar spine was performed. No intravenous contrast was administered. COMPARISON:  10/20/2020 FINDINGS: Segmentation:  Standard. Alignment:  Anteroposterior alignment is maintained. Vertebrae: Marrow edema associated with L4-L5 facet arthropathy. Vertebral body heights are maintained. No suspicious osseous lesion. Conus medullaris and cauda equina: Conus extends to the L1 level. Conus and cauda equina appear normal. Paraspinal and other soft tissues: Paraspinal edema associated with L4-L5 facet arthropathy. Disc levels: L1-L2:  No stenosis. L2-L3:  No stenosis. L3-L4: Prior left laminectomy. Disc bulge with increased size of left subarticular/foraminal annular fissure and shallow protrusion. Facet arthropathy with residual right ligamentum flavum infolding. No canal stenosis. Unchanged slight narrowing of the subarticular recesses. Unchanged minor right foraminal stenosis. Slightly increased mild to moderate left foraminal stenosis. L4-L5: Prior left laminectomy. Disc bulge eccentric to the left. Marked facet arthropathy with increased joint effusions. There is a more cystic-appearing 9 mm synovial cyst along the left lateral aspect of the spinal canal. Residual ligamentum flavum infolding. Similar mild canal stenosis. Similar partial effacement the  right subarticular recess. Persistent effacement of the left subarticular recess with compression of traversing L5 nerve roots. Similar mild right and moderate left foraminal stenosis. L5-S1: Similar moderate right and mild left facet arthropathy. No canal or foraminal stenosis. IMPRESSION: Degenerative and prior postoperative changes as detailed above. There is no high-grade canal stenosis. Increased facet arthropathy at L4-L5 with marrow and soft tissue edema, fusions, and intraspinal synovial cyst compressing the traversing left L5 nerve roots. Slightly increased disc protrusion/annular fissure and left foraminal stenosis at L3-L4. Electronically Signed   By: Macy Mis M.D.   On: 01/28/2021 12:01   EEG adult  Result Date: 01/28/2021 Lora Havens, MD     01/28/2021  8:41 AM Patient Name: COLETTA LOCKNER MRN: [id number] Epilepsy Attending: Lora Havens Referring Physician/Provider: Dr Roxan Hockey Date: 01/27/2021 Duration: 23.30 mins  Patient history: 55 year old female with altered mental status.  EEG to evaluate for seizures. Level of alertness: Awake AEDs during EEG study: Topiramate, zonisamide, gabapentin Technical aspects: This EEG study was done with scalp electrodes positioned according to the 10-20 International system of electrode placement. Electrical activity was acquired at a sampling rate of 500Hz  and reviewed with a high frequency filter of 70Hz  and a low frequency filter of 1Hz . EEG data were recorded continuously and digitally stored. Description: The posterior dominant rhythm consists of 9-10 Hz activity of moderate voltage (25-35 uV) seen predominantly in posterior head regions, symmetric and reactive to eye opening and eye closing. Hyperventilation and photic stimulation were not performed.   IMPRESSION: This study is within normal limits. No seizures or epileptiform discharges were seen throughout the recording. Lora Havens   MM 3D SCREEN BREAST BILATERAL  Result Date:  01/12/2021 CLINICAL DATA:  Screening. EXAM: DIGITAL SCREENING BILATERAL MAMMOGRAM WITH TOMOSYNTHESIS AND CAD TECHNIQUE: Bilateral screening digital craniocaudal and mediolateral oblique mammograms were obtained. Bilateral screening digital breast tomosynthesis was performed. The images were evaluated with computer-aided detection. COMPARISON:  Previous exam(s). ACR Breast Density Category b: There are scattered areas of fibroglandular density. FINDINGS: There are no findings suspicious for malignancy. The images were evaluated with computer-aided detection. IMPRESSION: No mammographic evidence of malignancy. A result letter of this screening mammogram will be mailed directly to the patient. RECOMMENDATION: Screening mammogram in one year. (Code:SM-B-01Y) BI-RADS CATEGORY  1: Negative. Electronically Signed   By: Nolon Nations M.D.   On: 01/12/2021 15:42   ECHOCARDIOGRAM LIMITED  Result Date: 01/26/2021    ECHOCARDIOGRAM LIMITED REPORT   Patient Name:   [name] Date of Exam: 01/26/2021 Medical Rec #:  [id number]      Height:       65.5 in Accession #:    [id number]     Weight:       178.6 lb Date of Birth:  05/30/1966       BSA:          1.896 m Patient Age:    68 years       BP:           175/101 mmHg Patient Gender: F              HR:           90 bpm. Exam Location:  [facility] Procedure: Limited Echo Indications:    Stroke I63.9  History:        Patient has prior history of Echocardiogram examinations, most                 recent 10/08/2020. CAD, Signs/Symptoms:Chest Pain; Risk                 Factors:Former Smoker and Hypertension. GERD, Mitral Valve                 Prolapse.  Sonographer:    Leavy Cella RDCS (AE) Referring Phys: [id number] COURAGE EMOKPAE IMPRESSIONS  1. Left ventricular ejection fraction, by estimation, is 55 to 60%. The left ventricle has normal function. The left ventricle has no regional wall motion abnormalities. There is mild left ventricular hypertrophy.  2. Right ventricular  systolic function is normal. The right ventricular size is normal.  3. The mitral valve is normal in structure. No evidence of mitral valve regurgitation.  4. Aortic valve regurgitation is not visualized. Mild aortic valve sclerosis is present, with no evidence of aortic valve stenosis.  5. The inferior vena cava is normal in size with greater than 50% respiratory variability, suggesting right atrial pressure of 3 mmHg. Comparison(s): The left ventricular function is unchanged. FINDINGS  Left Ventricle: Left ventricular ejection fraction, by estimation, is 55 to 60%. The left ventricle has normal function. The left ventricle has no regional wall motion abnormalities. The left ventricular internal cavity size was normal in size. There is  mild left ventricular hypertrophy. Right Ventricle: The right ventricular size is normal. Right vetricular wall thickness was not assessed. Right ventricular systolic function is normal. Left Atrium: Left atrial size was normal in size. Right Atrium: Right atrial size was normal in size. Pericardium: There is no evidence of pericardial effusion. Mitral Valve: The mitral valve is normal in structure. Tricuspid Valve: The tricuspid valve is normal in structure. Tricuspid valve regurgitation is not demonstrated. Aortic Valve: Aortic valve regurgitation is not visualized. Mild aortic valve sclerosis is present, with no evidence of aortic valve stenosis. Pulmonic Valve: The pulmonic valve was not well visualized. Aorta: The aortic root is normal in size and structure. Venous: The inferior vena cava is normal in size with greater than 50% respiratory variability, suggesting right atrial pressure of 3 mmHg. LEFT VENTRICLE PLAX 2D LVIDd:         4.01 cm  Diastology LVIDs:         2.88 cm  LV e' medial:    5.34 cm/s LV PW:         1.18 cm  LV E/e' medial:  11.1 LV IVS:        1.31 cm  LV e' lateral:   11.00 cm/s LVOT diam:     1.90 cm  LV E/e' lateral: 5.4 LVOT Area:     2.84 cm  LEFT  ATRIUM           Index LA diam:      3.30 cm 1.74 cm/m LA Vol (A4C): 26.0 ml 13.71 ml/m   AORTA Ao Root diam: 2.80 cm MITRAL VALVE MV Area (PHT): 4.21 cm     SHUNTS MV Decel Time: 180 msec     Systemic Diam: 1.90 cm MV E velocity: 59.10 cm/s MV A velocity: 104.00 cm/s MV E/A ratio:  0.57 Dorris Carnes MD Electronically signed by Dorris Carnes MD Signature Date/Time: 01/26/2021/5:30:21 PM    Final     Subjective:   Patient was seen and examined 01/31/2021, 12:21 PM Patient stable today. No acute distress.  No issues overnight Stable for discharge.  Discharge Exam:    Vitals:   01/30/21 1200 01/30/21 1346 01/30/21 2122 01/31/21 0434  BP: (!) 151/93 (!) 131/91 (!) 152/76 114/82  Pulse: 98 96 79 63  Resp: 15 18 19 19   Temp: 99.1 F (37.3 C) 98.3 F (36.8 C) 99.5 F (37.5 C) 97.6 F (36.4 C)  TempSrc: Oral Oral Oral Oral  SpO2: 99% 97% 97% 99%  Weight:        General: Pt lying comfortably in bed & appears in no obvious distress. Cardiovascular: S1 & S2 heard, RRR, S1/S2 +. No murmurs, rubs, gallops or clicks. No JVD or pedal edema. Respiratory: Clear to auscultation without wheezing, rhonchi or crackles. No increased work of breathing. Abdominal:  Non-distended, non-tender & soft. No organomegaly or masses appreciated. Normal bowel sounds heard. CNS: Alert and oriented. No focal deficits. Extremities: no edema, no cyanosis      The results of significant diagnostics from this hospitalization (including imaging, microbiology, ancillary and laboratory) are listed below  for reference.      Microbiology:   Recent Results (from the past 240 hour(s))  Resp Panel by RT-PCR (Flu A&B, Covid) Nasopharyngeal Swab     Status: None   Collection Time: 01/25/21  3:40 PM   Specimen: Nasopharyngeal Swab; Nasopharyngeal(NP) swabs in vial transport medium  Result Value Ref Range Status   SARS Coronavirus 2 by RT PCR NEGATIVE NEGATIVE Final    Comment: (NOTE) SARS-CoV-2 target nucleic acids are  NOT DETECTED.  The SARS-CoV-2 RNA is generally detectable in upper respiratory specimens during the acute phase of infection. The lowest concentration of SARS-CoV-2 viral copies this assay can detect is 138 copies/mL. A negative result does not preclude SARS-Cov-2 infection and should not be used as the sole basis for treatment or other patient management decisions. A negative result may occur with  improper specimen collection/handling, submission of specimen other than nasopharyngeal swab, presence of viral mutation(s) within the areas targeted by this assay, and inadequate number of viral copies(<138 copies/mL). A negative result must be combined with clinical observations, patient history, and epidemiological information. The expected result is Negative.  Fact Sheet for Patients:  EntrepreneurPulse.com.au  Fact Sheet for Healthcare Providers:  IncredibleEmployment.be  This test is no t yet approved or cleared by the [place] FDA and  has been authorized for detection and/or diagnosis of SARS-CoV-2 by FDA under an Emergency Use Authorization (EUA). This EUA will remain  in effect (meaning this test can be used) for the duration of the COVID-19 declaration under Section 564(b)(1) of the Act, 21 U.S.C.section 360bbb-3(b)(1), unless the authorization is terminated  or revoked sooner.       Influenza A by PCR NEGATIVE NEGATIVE Final   Influenza B by PCR NEGATIVE NEGATIVE Final    Comment: (NOTE) The Xpert Xpress SARS-CoV-2/FLU/RSV plus assay is intended as an aid in the diagnosis of influenza from Nasopharyngeal swab specimens and should not be used as a sole basis for treatment. Nasal washings and aspirates are unacceptable for Xpert Xpress SARS-CoV-2/FLU/RSV testing.  Fact Sheet for Patients: EntrepreneurPulse.com.au  Fact Sheet for Healthcare Providers: IncredibleEmployment.be  This test is not  yet approved or cleared by the [place] FDA and has been authorized for detection and/or diagnosis of SARS-CoV-2 by FDA under an Emergency Use Authorization (EUA). This EUA will remain in effect (meaning this test can be used) for the duration of the COVID-19 declaration under Section 564(b)(1) of the Act, 21 U.S.C. section 360bbb-3(b)(1), unless the authorization is terminated or revoked.  Performed at [facility], [address] [place], [place] [address]   Urine Culture     Status: Abnormal   Collection Time: 01/27/21  6:30 PM   Specimen: Urine, Catheterized  Result Value Ref Range Status   Specimen Description   Final    URINE, CATHETERIZED Performed at [facility], [address], [place], [place] [address]    Special Requests   Final    Normal Performed at [facility], [address] [place], [place] [address]    Culture (A)  Final    <10,000 COLONIES/mL INSIGNIFICANT GROWTH Performed at [facility] [address], [place],  [address]    Report Status 01/29/2021 FINAL  Final     Labs:   CBC: Recent Labs  Lab 01/25/21 1451 01/25/21 1505 01/26/21 0409  WBC 11.5*  --  11.3*  NEUTROABS 7.6  --  7.5  HGB 17.2* 18.4* 16.6*  HCT 53.4* 54.0* 50.8*  MCV 90.4  --  89.9  PLT 342  --  845   Basic Metabolic Panel: Recent Labs  Lab 01/25/21 1451 01/25/21 1505 01/26/21 0409 01/27/21 0448  NA 141 145 140 137  K 3.5 3.6 3.7 3.8  CL 105 107 106 104  CO2 26  --  22 23  GLUCOSE 93 91 113* 122*  BUN 16 17 20  33*  CREATININE 1.09* 1.10* 1.15* 1.29*  CALCIUM 9.7  --  9.3 8.7*   Liver Function Tests: Recent Labs  Lab 01/25/21 1451  AST 21  ALT 17  ALKPHOS 87  BILITOT 0.9  PROT 8.4*  ALBUMIN 4.6   BNP (last 3 results) No results for input(s): BNP in the last 8760 hours. Cardiac Enzymes: No results for input(s): CKTOTAL, CKMB, CKMBINDEX, TROPONINI in the last 168 hours. CBG: Recent Labs  Lab 01/29/21 1117 01/29/21 1821 01/30/21 0748  01/30/21 2139 01/31/21 1118  GLUCAP 132* 130* 112* 97 130*   Hgb A1c No results for input(s): HGBA1C in the last 72 hours. Lipid Profile No results for input(s): CHOL, HDL, LDLCALC, TRIG, CHOLHDL, LDLDIRECT in the last 72 hours. Thyroid function studies No results for input(s): TSH, T4TOTAL, T3FREE, THYROIDAB in the last 72 hours.  Invalid input(s): FREET3 Anemia work up No results for input(s): VITAMINB12, FOLATE, FERRITIN, TIBC, IRON, RETICCTPCT in the last 72 hours. Urinalysis    Component Value Date/Time   COLORURINE YELLOW 01/27/2021 1830   APPEARANCEUR CLEAR 01/27/2021 1830   LABSPEC 1.020 01/27/2021 1830   PHURINE 6.0 01/27/2021 1830   GLUCOSEU NEGATIVE 01/27/2021 1830   HGBUR SMALL (A) 01/27/2021 1830   BILIRUBINUR NEGATIVE 01/27/2021 [place] 01/27/2021 1830   PROTEINUR 30 (A) 01/27/2021 1830   UROBILINOGEN 1.0 08/04/2014 1030   NITRITE NEGATIVE 01/27/2021 1830   LEUKOCYTESUR NEGATIVE 01/27/2021 1830         Time coordinating discharge: Over 45 minutes  SIGNED: Deatra James, MD, FACP, FHM. [facility],  Please use [email or web address] to Page If 7PM-7AM, please contact night-coverage [email or web address] 01/31/2021, 12:21 PM

## 2021-01-31 NOTE — TOC Transition Note (Signed)
Transition of Care [facility]) - CM/SW Discharge Note   Patient Details  Name: [name] MRN: [id number] Date of Birth: [birth year]  [facility] CM/SW Contact:  Natasha Bence, LCSW Phone Number: 01/31/2021, 11:08 AM   Clinical Narrative:    CSW notified of patient's readiness for discharge. CSW contacted Marjory Lies with [facility] and notified them of d/c. Marjory Lies agreeable to provide services. TOC signing off.   Final next level of care: [place] Barriers to Discharge: Barriers Resolved   Patient Goals and CMS Choice Patient states their goals for this hospitalization and ongoing recovery are:: Return home with [facility] CMS Medicare.gov Compare Post Acute Care list provided to:: Patient Choice offered to / list presented to : Patient  Discharge Placement                    Patient and family notified of of transfer: 01/31/21  Discharge Plan and Services In-house Referral: NA Discharge Planning Services: CM Consult Post Acute Care Choice: Home Health          DME Arranged: N/A DME Agency: NA       HH Arranged: RN,PT,OT [place] Agency: [place] Date [place]: 01/31/21 Time [place]: 1108 Representative spoke with at [place]: [place] ([place]) Interventions     Readmission Risk Interventions No flowsheet data found.

## 2021-01-31 NOTE — Progress Notes (Signed)
Discharge instructions provided to patient. Patient verbalized understanding of discharge instructions. Informed patient of upcoming appointments. No further questions.

## 2021-02-03 ENCOUNTER — Inpatient Hospital Stay (HOSPITAL_COMMUNITY)
Admission: RE | Admit: 2021-02-03 | Discharge: 2021-02-03 | Disposition: A | Discharge status: Home / Self Care | Payer: Medicare HMO | Source: Ambulatory Visit

## 2021-02-03 DIAGNOSIS — E663 Overweight: Secondary | ICD-10-CM | POA: Diagnosis not present

## 2021-02-03 DIAGNOSIS — I639 Cerebral infarction, unspecified: Secondary | ICD-10-CM | POA: Diagnosis not present

## 2021-02-03 DIAGNOSIS — F419 Anxiety disorder, unspecified: Secondary | ICD-10-CM | POA: Diagnosis not present

## 2021-02-03 DIAGNOSIS — M5136 Other intervertebral disc degeneration, lumbar region: Secondary | ICD-10-CM | POA: Diagnosis not present

## 2021-02-03 DIAGNOSIS — Z6828 Body mass index (BMI) 28.0-28.9, adult: Secondary | ICD-10-CM | POA: Diagnosis not present

## 2021-02-03 DIAGNOSIS — M48061 Spinal stenosis, lumbar region without neurogenic claudication: Secondary | ICD-10-CM | POA: Diagnosis not present

## 2021-02-03 DIAGNOSIS — M4316 Spondylolisthesis, lumbar region: Secondary | ICD-10-CM | POA: Diagnosis not present

## 2021-02-03 DIAGNOSIS — R69 Illness, unspecified: Secondary | ICD-10-CM | POA: Diagnosis not present

## 2021-02-03 DIAGNOSIS — G894 Chronic pain syndrome: Secondary | ICD-10-CM | POA: Diagnosis not present

## 2021-02-03 LAB — HOMOCYSTEINE: Homocysteine: 20.5 umol/L — ABNORMAL HIGH (ref 0.0–14.5)

## 2021-02-04 ENCOUNTER — Encounter (HOSPITAL_COMMUNITY): Admission: RE | Payer: Self-pay | Source: Home / Self Care

## 2021-02-04 ENCOUNTER — Ambulatory Visit (HOSPITAL_COMMUNITY): Admission: RE | Admit: 2021-02-04 | Payer: Medicare HMO | Source: Home / Self Care | Admitting: Neurological Surgery

## 2021-02-04 SURGERY — POSTERIOR LUMBAR FUSION 1 LEVEL
Anesthesia: General | Site: Back

## 2021-02-04 NOTE — Progress Notes (Signed)
Cardiology Office Note:    Date:  02/18/2021   ID:  [name], DOB Jan 20, 1966, MRN [id number]  PCP:  Redmond School, MD  Cardiologist:  Pixie Casino, MD --> changing to Dr. Harrell Gave  Referring MD: Redmond School, MD   Chief Complaint  Patient presents with   Pre-op Exam    History of Present Illness:    [name] is a 55 y.o. female with a hx of HTN, HLD, GERD, hypothyroidism, Addison's disease, palpitations, and tobacco abuse. Heart cath in 2013 with no significant CAD. Echo 2016 with normal EF and grade 1 DD. She was admitted in Dec 2020 with pleuritic chest pain, headache, dizziness, and fever with T-max 102.9, intractable emesis, and AKI. She had COVID-19 exposure but tested negative. Myoview 01/08/20 with breast attenuation but no ischemia, low risk study. She was seen by Dr. Debara Pickett 09/17/20 virtually and reported worsening swelling and SOB with a 10 lbs weight gain. She did not go to the ER because she cares for her paraplegic daughter. Dr. Debara Pickett started lasix 20 mg and ordered echo. She is on steroids for addisons disease.   Echo showed normal EF of 55-60%, grade 1 DD, normal RV function, no significant valvular disease, no wall motion abnormality. Repeat BMP after starting 20 mg lasix showed sCr 1.24 - she has a history of fluctuation 1.01 - 1.3, K was 4.7.   I saw her 10/21/20 with additional swelling and orthopnea. I increased lasix for three days and asked her to check in with her endocrinologist. She also complained of palpitations.  She wore a heart monitor in March 2022 that did not show evidence of atrial fibrillation or flutter, but did show 2 short runs of SVT and infrequent PVCs.  She was recently hospitalized for acute CVA found to have "unimpressive" punctate acute or subacute infarction. EEG showed left temporal epileptic form discharges. Neurology started antiplatelet therapy and topamax 25 mg BID. Prior to her stroke, she was on ASA monotherapy and this was  considered a potential ASA failure.  She was discharged 01/31/21 on DAPT ASA/plaivx x 30 days, then plavix monotherapy. Lipitor increased from 20 mg to 40 mg.    Echocardiogram was repeated on 01/27/2019 and showed an EF of 55 to 60%, mild LVH, and no significant valvular disease.   She presents today for follow up. She is very frustrated.  and feels she has not been listened to - she recounts that her medications significantly changed at discharge. She is no longer taking amlodipine or lasix.   She is struggling with lower extremity edema. She denies dyspnea but reports symptoms concerning for orthopnea over the last 3-6 months. However, she also needs sleep study. Echo x 2 reviewed. She did not do well with increased lasix - dehydration, AKI, and hyperkalemia.   Her recent CVA also happened in the setting of ASA - however, she now admits to me that she had been out of ASA for about 1 month, but didn't think it was important enough to mention while in the hospital. I would like neurology to weigh in on if this changes her lifelong plavix recommendation. She is no longer smoking cigarettes.    Past Medical History:  Diagnosis Date   Addison's disease ([place])    Addison's disease ([place])    Adrenal insufficiency ([place])    diagnosed 2012   Aneurysm ([place])    Anxiety    Arthritis    Astigmatism    CAD (coronary artery disease)  Cath 2008 EF normal. RCA 50-60, Septal 50%. Myoview 3/12: EF 53% normal perfusion   Cardiac arrest ([place])    2/2 adissonian crisis   Cardiomyopathy    resolved   Chest pain    chronicc   CHF (congestive heart failure) (HCC)    Chronic back pain    Chronic diarrhea    Concussion    sept 28th 2014   Family history of breast cancer    Family history of colon cancer    Gastroparesis    GERD (gastroesophageal reflux disease)    HTN (hypertension)    Hyperlipidemia    Hypothyroidism    Mitral valve prolapse    Nondiabetic gastroparesis    PONV (postoperative nausea  and vomiting)    QT prolongation    Tobacco abuse    down to 2 cigarettes per day   Vertigo     Past Surgical History:  Procedure Laterality Date   ABDOMINAL HYSTERECTOMY     CARDIAC CATHETERIZATION  03/2007   showed 60% lesion in the right coronary artery   CHOLECYSTECTOMY     LEFT HEART CATHETERIZATION WITH CORONARY ANGIOGRAM N/A 04/13/2012   Procedure: LEFT HEART CATHETERIZATION WITH CORONARY ANGIOGRAM;  Surgeon: Hillary Bow, MD;  Location: [facility] CATH LAB;  Service: Cardiovascular;  Laterality: N/A;   LUMBAR LAMINECTOMY/DECOMPRESSION MICRODISCECTOMY Left 12/20/2018   Procedure: Left Lumbar Three-Four Lumbar Laminotomy and Foraminotomy, Lumbar Four-Five Laminotomy and Foraminotomy with Microdiscectomy and Resection of Synovial Cyst;  Surgeon: Jovita Gamma, MD;  Location: [facility];  Service: Neurosurgery;  Laterality: Left;  Left Lumbar 3-4 Lumbar laminotomy, foraminotomy, possible microdiscectomy with possible resection of synovial cyst   SPINE SURGERY     VARICOSE VEIN SURGERY     VESICOVAGINAL FISTULA CLOSURE W/ TAH      Current Medications: Current Meds  Medication Sig   albuterol (VENTOLIN HFA) 108 (90 Base) MCG/ACT inhaler Inhale 1-2 puffs into the lungs every 6 (six) hours as needed for wheezing or shortness of breath.    alendronate (FOSAMAX) 70 MG tablet Take 70 mg by mouth once a week.   ALPRAZolam (XANAX) 0.5 MG tablet Take 0.5 mg by mouth 3 (three) times daily.   aspirin 81 MG chewable tablet Chew 1 tablet (81 mg total) by mouth daily.   atorvastatin (LIPITOR) 40 MG tablet Take 1 tablet (40 mg total) by mouth daily.   buPROPion (WELLBUTRIN SR) 150 MG 12 hr tablet Take 150 mg by mouth 2 (two) times daily.   carvedilol (COREG) 6.25 MG tablet Take 1 tablet (6.25 mg total) by mouth 2 (two) times daily with a meal.   cholecalciferol (VITAMIN D) 1000 UNITS tablet Take 1,000 Units by mouth daily.   clopidogrel (PLAVIX) 75 MG tablet Take 1 tablet (75 mg total) by mouth daily.    gabapentin (NEURONTIN) 600 MG tablet Take 600 mg by mouth 4 (four) times daily.   hydrocortisone (CORTEF) 10 MG tablet Take 10-20 mg by mouth See admin instructions. Takes 20 mg in the morning and 10 mg at night   levothyroxine (SYNTHROID, LEVOTHROID) 75 MCG tablet Take 75 mcg by mouth daily before breakfast.   meclizine (ANTIVERT) 25 MG tablet 1 or 2 tabs PO q8h prn dizziness   nitroGLYCERIN (NITROSTAT) 0.4 MG SL tablet Place 1 tablet (0.4 mg total) under the tongue every 5 (five) minutes as needed for chest pain.   ondansetron (ZOFRAN) 4 MG tablet Take 4 mg by mouth every 6 (six) hours as needed.   oxyCODONE (ROXICODONE) 15  MG immediate release tablet Take 15 mg by mouth every 4 (four) hours as needed for pain.    tamsulosin (FLOMAX) 0.4 MG CAPS capsule Take 1 capsule (0.4 mg total) by mouth daily after supper.   tiZANidine (ZANAFLEX) 4 MG tablet Take 8 mg by mouth 2 (two) times daily.   topiramate (TOPAMAX) 25 MG tablet Take 1 tablet (25 mg total) by mouth 2 (two) times daily.   zolpidem (AMBIEN) 10 MG tablet Take 10 mg by mouth at bedtime.   [DISCONTINUED] ondansetron (ZOFRAN ODT) 4 MG disintegrating tablet Take 1 tablet (4 mg total) by mouth every 8 (eight) hours as needed for nausea.     Allergies:   Bee venom, Doxycycline, Erythromycin, Ibuprofen, Penicillins, Sulfa antibiotics, Cat hair extract, Dust mite extract, Tramadol, and Morphine and related   Social History   Socioeconomic History   Marital status: Single    Spouse name: Not on file   Number of children: Not on file   Years of education: Not on file   Highest education level: Not on file  Occupational History   Not on file  Tobacco Use   Smoking status: Former    Packs/day: 0.25    Years: 10.00    Pack years: 2.50    Types: Cigarettes    Quit date: 01/25/2021    Years since quitting: 0.0   Smokeless tobacco: Never   Tobacco comments:    smokes 2 a day  Vaping Use   Vaping Use: Former   Substances: Nicotine, CBD   Substance and Sexual Activity   Alcohol use: No   Drug use: Yes    Types: Marijuana, Oxycodone    Comment: Hisrory of Cocaine use    Sexual activity: Not Currently    Birth control/protection: Surgical  Other Topics Concern   Not on file  Social History Narrative   Lives in [place] with daughter and son. She is on disability.    Social Determinants of Health   Financial Resource Strain: Not on file  Food Insecurity: Not on file  Transportation Needs: Not on file  Physical Activity: Not on file  Stress: Not on file  Social Connections: Not on file     Family History: The patient's family history includes Breast cancer (age of onset: 48) in her paternal uncle; Cancer in her father; Coronary artery disease in an other family member; Dementia in her maternal grandmother; Heart attack in her mother; Leukemia (age of onset: 66) in her maternal uncle.  ROS:   Please see the history of present illness.    All other systems reviewed and are negative.  EKGs/Labs/Other Studies Reviewed:    The following studies were reviewed today:  Echo 01/26/21:  1. Left ventricular ejection fraction, by estimation, is 55 to 60%. The  left ventricle has normal function. The left ventricle has no regional  wall motion abnormalities. There is mild left ventricular hypertrophy.   2. Right ventricular systolic function is normal. The right ventricular  size is normal.   3. The mitral valve is normal in structure. No evidence of mitral valve  regurgitation.   4. Aortic valve regurgitation is not visualized. Mild aortic valve  sclerosis is present, with no evidence of aortic valve stenosis.   5. The inferior vena cava is normal in size with greater than 50%  respiratory variability, suggesting right atrial pressure of 3 mmHg.   EKG:  EKG is not ordered today.   Recent Labs: 01/25/2021: ALT 17 01/26/2021: Hemoglobin 16.6; Platelets  348 01/27/2021: BUN 33; Creatinine, Ser 1.29; Potassium 3.8; Sodium  137; TSH 0.348  Recent Lipid Panel    Component Value Date/Time   CHOL 137 01/26/2021 0410   TRIG 163 (H) 01/26/2021 0410   HDL 50 01/26/2021 0410   CHOLHDL 2.7 01/26/2021 0410   VLDL 33 01/26/2021 0410   LDLCALC 54 01/26/2021 0410    Physical Exam:    VS:  BP (!) 146/78   Pulse 64   Ht 5' 5.5" (1.664 m)   Wt 184 lb 12.8 oz (83.8 kg)   SpO2 96%   BMI 30.28 kg/m     Wt Readings from Last 3 Encounters:  02/18/21 184 lb 12.8 oz (83.8 kg)  01/25/21 178 lb 9.2 oz (81 kg)  11/13/20 178 lb (80.7 kg)     GEN: Well nourished, well developed in no acute distress HEENT: Normal NECK: No JVD; No carotid bruits LYMPHATICS: No lymphadenopathy CARDIAC: RRR, no murmurs, rubs, gallops RESPIRATORY:  Clear to auscultation without rales, wheezing or rhonchi  ABDOMEN: Soft, non-tender, non-distended MUSCULOSKELETAL:  1+ B LE edema; No deformity  SKIN: Warm and dry NEUROLOGIC:  Alert and oriented x 3 PSYCHIATRIC:  Normal affect   ASSESSMENT:    1. Lower extremity edema   2. Diastolic dysfunction   3. Essential hypertension   4. Palpitations   5. Former smoker   6. Preoperative clearance    PLAN:    In order of problems listed above:  Lower extremity swelling Mild DD - caution with Addison's disease - she is using compressions stockings but not elevating legs - I'm not certain her lower extremity edema is cardiac in nature, may be more related to her numbness and back problems - she has a history of vein stripping in her left leg - no changes - continue with back surgery, compression socks, and leg elevation - we will re-evaluate this after back surgery   Hypertension  - amlodipine was stopped - agree with this given her concomitant lower extremity swelling - doing well on coreg - no changes   Palpitations - heart monitor in March 2022 with 2 short runs of SVT (< 8 beats) and infrequent (< 1%) isolated PVCs - continue coreg 6.25 mg mg BID   Former smoker - she quit  smoking 2 months ago   Needs sleep study - will resume with this following her back surgery   Preop evaluation for cardiac risk - recent myoview nonischemic - recent echo with normal EF and no WMA - she denies recent chest pain today  She can't complete 4.0 METS, is limited by back pain and leg numbness. Reassuring nuclear stress test 01/2020 and echo 01/2021. She has no history of MI or PCI. She has had a recent CVA.  She understands she is at higher risk for cardiac complications during the perioperative period and wishes to proceed. I do not think additional testing at this time would mitigate her risk. According to RCRI, she has 6.6% risk of cardiac complications.   She will require neurology input for timing of surgery and need to hold plavix.   I will fax to Dr. Ronnald Ramp at [facility].    Medication Adjustments/Labs and Tests Ordered: Current medicines are reviewed at length with the patient today.  Concerns regarding medicines are outlined above.  No orders of the defined types were placed in this encounter.  No orders of the defined types were placed in this encounter.   Signed, Ledora Bottcher, PA  02/18/2021 5:33 PM    [facility]

## 2021-02-05 DIAGNOSIS — I69341 Monoplegia of lower limb following cerebral infarction affecting right dominant side: Secondary | ICD-10-CM | POA: Diagnosis not present

## 2021-02-05 DIAGNOSIS — E271 Primary adrenocortical insufficiency: Secondary | ICD-10-CM | POA: Diagnosis not present

## 2021-02-05 DIAGNOSIS — I69344 Monoplegia of lower limb following cerebral infarction affecting left non-dominant side: Secondary | ICD-10-CM | POA: Diagnosis not present

## 2021-02-05 DIAGNOSIS — I509 Heart failure, unspecified: Secondary | ICD-10-CM | POA: Diagnosis not present

## 2021-02-05 DIAGNOSIS — R1310 Dysphagia, unspecified: Secondary | ICD-10-CM | POA: Diagnosis not present

## 2021-02-05 DIAGNOSIS — I2510 Atherosclerotic heart disease of native coronary artery without angina pectoris: Secondary | ICD-10-CM | POA: Diagnosis not present

## 2021-02-05 DIAGNOSIS — I69320 Aphasia following cerebral infarction: Secondary | ICD-10-CM | POA: Diagnosis not present

## 2021-02-05 DIAGNOSIS — G35 Multiple sclerosis: Secondary | ICD-10-CM | POA: Diagnosis not present

## 2021-02-05 DIAGNOSIS — I69391 Dysphagia following cerebral infarction: Secondary | ICD-10-CM | POA: Diagnosis not present

## 2021-02-05 DIAGNOSIS — M5136 Other intervertebral disc degeneration, lumbar region: Secondary | ICD-10-CM | POA: Diagnosis not present

## 2021-02-05 DIAGNOSIS — I110 Hypertensive heart disease with heart failure: Secondary | ICD-10-CM | POA: Diagnosis not present

## 2021-02-10 DIAGNOSIS — I69341 Monoplegia of lower limb following cerebral infarction affecting right dominant side: Secondary | ICD-10-CM | POA: Diagnosis not present

## 2021-02-10 DIAGNOSIS — R1310 Dysphagia, unspecified: Secondary | ICD-10-CM | POA: Diagnosis not present

## 2021-02-10 DIAGNOSIS — I69320 Aphasia following cerebral infarction: Secondary | ICD-10-CM | POA: Diagnosis not present

## 2021-02-10 DIAGNOSIS — I110 Hypertensive heart disease with heart failure: Secondary | ICD-10-CM | POA: Diagnosis not present

## 2021-02-10 DIAGNOSIS — G35 Multiple sclerosis: Secondary | ICD-10-CM | POA: Diagnosis not present

## 2021-02-10 DIAGNOSIS — I69344 Monoplegia of lower limb following cerebral infarction affecting left non-dominant side: Secondary | ICD-10-CM | POA: Diagnosis not present

## 2021-02-10 DIAGNOSIS — I69391 Dysphagia following cerebral infarction: Secondary | ICD-10-CM | POA: Diagnosis not present

## 2021-02-10 DIAGNOSIS — I509 Heart failure, unspecified: Secondary | ICD-10-CM | POA: Diagnosis not present

## 2021-02-10 DIAGNOSIS — M5136 Other intervertebral disc degeneration, lumbar region: Secondary | ICD-10-CM | POA: Diagnosis not present

## 2021-02-10 DIAGNOSIS — I2510 Atherosclerotic heart disease of native coronary artery without angina pectoris: Secondary | ICD-10-CM | POA: Diagnosis not present

## 2021-02-11 DIAGNOSIS — M5136 Other intervertebral disc degeneration, lumbar region: Secondary | ICD-10-CM | POA: Diagnosis not present

## 2021-02-11 DIAGNOSIS — R1310 Dysphagia, unspecified: Secondary | ICD-10-CM | POA: Diagnosis not present

## 2021-02-11 DIAGNOSIS — I2510 Atherosclerotic heart disease of native coronary artery without angina pectoris: Secondary | ICD-10-CM | POA: Diagnosis not present

## 2021-02-11 DIAGNOSIS — I509 Heart failure, unspecified: Secondary | ICD-10-CM | POA: Diagnosis not present

## 2021-02-11 DIAGNOSIS — I69391 Dysphagia following cerebral infarction: Secondary | ICD-10-CM | POA: Diagnosis not present

## 2021-02-11 DIAGNOSIS — I69344 Monoplegia of lower limb following cerebral infarction affecting left non-dominant side: Secondary | ICD-10-CM | POA: Diagnosis not present

## 2021-02-11 DIAGNOSIS — I110 Hypertensive heart disease with heart failure: Secondary | ICD-10-CM | POA: Diagnosis not present

## 2021-02-11 DIAGNOSIS — G35 Multiple sclerosis: Secondary | ICD-10-CM | POA: Diagnosis not present

## 2021-02-11 DIAGNOSIS — I69341 Monoplegia of lower limb following cerebral infarction affecting right dominant side: Secondary | ICD-10-CM | POA: Diagnosis not present

## 2021-02-11 DIAGNOSIS — I69320 Aphasia following cerebral infarction: Secondary | ICD-10-CM | POA: Diagnosis not present

## 2021-02-12 DIAGNOSIS — I69344 Monoplegia of lower limb following cerebral infarction affecting left non-dominant side: Secondary | ICD-10-CM | POA: Diagnosis not present

## 2021-02-12 DIAGNOSIS — I69320 Aphasia following cerebral infarction: Secondary | ICD-10-CM | POA: Diagnosis not present

## 2021-02-12 DIAGNOSIS — I110 Hypertensive heart disease with heart failure: Secondary | ICD-10-CM | POA: Diagnosis not present

## 2021-02-12 DIAGNOSIS — G35 Multiple sclerosis: Secondary | ICD-10-CM | POA: Diagnosis not present

## 2021-02-12 DIAGNOSIS — I2510 Atherosclerotic heart disease of native coronary artery without angina pectoris: Secondary | ICD-10-CM | POA: Diagnosis not present

## 2021-02-12 DIAGNOSIS — M5136 Other intervertebral disc degeneration, lumbar region: Secondary | ICD-10-CM | POA: Diagnosis not present

## 2021-02-12 DIAGNOSIS — I69341 Monoplegia of lower limb following cerebral infarction affecting right dominant side: Secondary | ICD-10-CM | POA: Diagnosis not present

## 2021-02-12 DIAGNOSIS — I69391 Dysphagia following cerebral infarction: Secondary | ICD-10-CM | POA: Diagnosis not present

## 2021-02-12 DIAGNOSIS — R1310 Dysphagia, unspecified: Secondary | ICD-10-CM | POA: Diagnosis not present

## 2021-02-12 DIAGNOSIS — I509 Heart failure, unspecified: Secondary | ICD-10-CM | POA: Diagnosis not present

## 2021-02-16 DIAGNOSIS — I69341 Monoplegia of lower limb following cerebral infarction affecting right dominant side: Secondary | ICD-10-CM | POA: Diagnosis not present

## 2021-02-16 DIAGNOSIS — I69320 Aphasia following cerebral infarction: Secondary | ICD-10-CM | POA: Diagnosis not present

## 2021-02-16 DIAGNOSIS — I69391 Dysphagia following cerebral infarction: Secondary | ICD-10-CM | POA: Diagnosis not present

## 2021-02-16 DIAGNOSIS — I69344 Monoplegia of lower limb following cerebral infarction affecting left non-dominant side: Secondary | ICD-10-CM | POA: Diagnosis not present

## 2021-02-17 DIAGNOSIS — E063 Autoimmune thyroiditis: Secondary | ICD-10-CM | POA: Diagnosis not present

## 2021-02-17 DIAGNOSIS — I10 Essential (primary) hypertension: Secondary | ICD-10-CM | POA: Diagnosis not present

## 2021-02-17 DIAGNOSIS — G894 Chronic pain syndrome: Secondary | ICD-10-CM | POA: Diagnosis not present

## 2021-02-17 DIAGNOSIS — I6930 Unspecified sequelae of cerebral infarction: Secondary | ICD-10-CM | POA: Diagnosis not present

## 2021-02-18 ENCOUNTER — Ambulatory Visit (INDEPENDENT_AMBULATORY_CARE_PROVIDER_SITE_OTHER): Payer: Medicare HMO | Admitting: Physician Assistant

## 2021-02-18 ENCOUNTER — Other Ambulatory Visit: Payer: Self-pay

## 2021-02-18 ENCOUNTER — Encounter: Payer: Self-pay | Admitting: Physician Assistant

## 2021-02-18 VITALS — BP 146/78 | HR 64 | Ht 65.5 in | Wt 184.8 lb

## 2021-02-18 DIAGNOSIS — Z01810 Encounter for preprocedural cardiovascular examination: Secondary | ICD-10-CM | POA: Diagnosis not present

## 2021-02-18 DIAGNOSIS — Z87891 Personal history of nicotine dependence: Secondary | ICD-10-CM | POA: Diagnosis not present

## 2021-02-18 DIAGNOSIS — I10 Essential (primary) hypertension: Secondary | ICD-10-CM | POA: Diagnosis not present

## 2021-02-18 DIAGNOSIS — Z01818 Encounter for other preprocedural examination: Secondary | ICD-10-CM

## 2021-02-18 DIAGNOSIS — I5189 Other ill-defined heart diseases: Secondary | ICD-10-CM

## 2021-02-18 DIAGNOSIS — R600 Localized edema: Secondary | ICD-10-CM | POA: Diagnosis not present

## 2021-02-18 DIAGNOSIS — R002 Palpitations: Secondary | ICD-10-CM

## 2021-02-18 NOTE — Patient Instructions (Signed)
Medication Instructions:  No Changes *If you need a refill on your cardiac medications before your next appointment, please call your pharmacy*   Lab Work: No Labs If you have labs (blood work) drawn today and your tests are completely normal, you will receive your results only by: [place] (if you have MyChart) OR A paper copy in the mail If you have any lab test that is abnormal or we need to change your treatment, we will call you to review the results.   Testing/Procedures: No Testing   Follow-Up: At [facility], you and your health needs are our priority.  As part of our continuing mission to provide you with exceptional heart care, we have created designated Provider Care Teams.  These Care Teams include your primary Cardiologist (physician) and Advanced Practice Providers (APPs -  Physician Assistants and Nurse Practitioners) who all work together to provide you with the care you need, when you need it.  We recommend signing up for the patient portal called "MyChart".  Sign up information is provided on this After Visit Summary.  MyChart is used to connect with patients for Virtual Visits (Telemedicine).  Patients are able to view lab/test results, encounter notes, upcoming appointments, etc.  Non-urgent messages can be sent to your provider as well.   To learn more about what you can do with MyChart, go to [email or web address].    Your next appointment:   4-6 week(s)  The format for your next appointment:   In Person  Provider:   Buford Dresser, MD   Other Instructions Please obtain Clearance from Neurology  for time to hold Plavix. Please advise Neurology patient not taking Aspirin 1 month prior to stroke.

## 2021-02-19 DIAGNOSIS — M4316 Spondylolisthesis, lumbar region: Secondary | ICD-10-CM | POA: Diagnosis not present

## 2021-02-23 ENCOUNTER — Other Ambulatory Visit: Payer: Self-pay | Admitting: Neurological Surgery

## 2021-02-23 DIAGNOSIS — I110 Hypertensive heart disease with heart failure: Secondary | ICD-10-CM | POA: Diagnosis not present

## 2021-02-23 DIAGNOSIS — R252 Cramp and spasm: Secondary | ICD-10-CM | POA: Diagnosis not present

## 2021-02-23 DIAGNOSIS — Z79891 Long term (current) use of opiate analgesic: Secondary | ICD-10-CM | POA: Diagnosis not present

## 2021-02-23 DIAGNOSIS — I69320 Aphasia following cerebral infarction: Secondary | ICD-10-CM | POA: Diagnosis not present

## 2021-02-23 DIAGNOSIS — I509 Heart failure, unspecified: Secondary | ICD-10-CM | POA: Diagnosis not present

## 2021-02-23 DIAGNOSIS — I69341 Monoplegia of lower limb following cerebral infarction affecting right dominant side: Secondary | ICD-10-CM | POA: Diagnosis not present

## 2021-02-23 DIAGNOSIS — G35 Multiple sclerosis: Secondary | ICD-10-CM | POA: Diagnosis not present

## 2021-02-23 DIAGNOSIS — M4310 Spondylolisthesis, site unspecified: Secondary | ICD-10-CM

## 2021-02-23 DIAGNOSIS — I69344 Monoplegia of lower limb following cerebral infarction affecting left non-dominant side: Secondary | ICD-10-CM | POA: Diagnosis not present

## 2021-02-23 DIAGNOSIS — M545 Low back pain, unspecified: Secondary | ICD-10-CM | POA: Diagnosis not present

## 2021-02-23 DIAGNOSIS — M5416 Radiculopathy, lumbar region: Secondary | ICD-10-CM | POA: Diagnosis not present

## 2021-02-23 DIAGNOSIS — I2510 Atherosclerotic heart disease of native coronary artery without angina pectoris: Secondary | ICD-10-CM | POA: Diagnosis not present

## 2021-02-23 DIAGNOSIS — I69391 Dysphagia following cerebral infarction: Secondary | ICD-10-CM | POA: Diagnosis not present

## 2021-02-23 DIAGNOSIS — I6930 Unspecified sequelae of cerebral infarction: Secondary | ICD-10-CM | POA: Diagnosis not present

## 2021-02-23 DIAGNOSIS — M5136 Other intervertebral disc degeneration, lumbar region: Secondary | ICD-10-CM | POA: Diagnosis not present

## 2021-02-23 DIAGNOSIS — R1310 Dysphagia, unspecified: Secondary | ICD-10-CM | POA: Diagnosis not present

## 2021-02-23 DIAGNOSIS — M542 Cervicalgia: Secondary | ICD-10-CM | POA: Diagnosis not present

## 2021-02-24 ENCOUNTER — Other Ambulatory Visit: Payer: Self-pay | Admitting: Neurology

## 2021-02-24 DIAGNOSIS — M5416 Radiculopathy, lumbar region: Secondary | ICD-10-CM

## 2021-03-12 NOTE — Telephone Encounter (Addendum)
   Patient Name: [name]  DOB: 1965/12/25  MRN: [id number]   Primary Cardiologist: Pixie Casino, MD  Chart reviewed as part of pre-operative protocol coverage. Pre-operative note initially faxed to surgeon 01/21/21 by Richardson Dopp, PA-C with surgery details and date changing since that time. 03/12/21 telephone encounter states that the surgery is now scheduled for 03/18/21 and will include L3-L4 fusion in addition to the previous L4-L5.   Since the initial pre-operative review, she was admitted to the hospital 01/2021 for acute CVA. After this admission, she was seen by our office for preoperative cardiac evaluation by Fabian Sharp, PA-C on 02/18/21.   At her last 02/18/21 visit, she was doing well without CP. It was noted by Fabian Sharp, PA-C that the patient understood she was at higher risk of cardiac complications during the perioperative period and wished to proceed. No additional testing was recommended at that time that would mitigate her risk. It was noted previous testing included recent 01/07/21 MPI that was nonischemic and 01/26/21 echo was with nl EF and NRWMA. She was unable to complete 4.0 METS as limited by back pain and leg numbness.   It was recommended 02/18/21 she receive neurology input for timing of surgery and need to hold Plavix.  OV note from 02/18/21 was faxed to Dr. Ronnald Ramp at [facility].   Per CareEverywhere OV note by neurology 02/23/21, patient is "cleared for surgery [with instructions to] stop Plavix and tizanidine [but] continue with aspirin. Hold topamax."   We would recommend that surgery team reaches out to neurology regarding the decision for antiplatelet therapy in regard to the patient's upcoming surgery.  Will route this message back to [facility] and remove from pre-op pool.   Arvil Chaco, PA-C 03/12/2021, 11:38 AM

## 2021-03-12 NOTE — Telephone Encounter (Signed)
Dr. Adah Salvage office called. The surgery will now include L3-L4 fusion in addition to L4-L5. The surgery is scheduled for 7.13.22

## 2021-03-13 NOTE — Pre-Procedure Instructions (Signed)
Surgical Instructions   Your procedure is scheduled on Wednesday, March 18, 2021. Report to [facility] Main Entrance "A" at 06:45 A.M., then check in with the Admitting office.  Call this number if you have problems the morning of surgery:[phone]  If you have any questions prior to your surgery date call [phone]: Open Monday-Friday 8am-4pm   Remember: Do not eat or drink after midnight the night before your surgery     Take these medicines the morning of surgery with A SIP OF WATER  ALPRAZolam (XANAX)  atorvastatin (LIPITOR) buPROPion (WELLBUTRIN SR)  carvedilol (COREG)  hydrocortisone (CORTEF) levothyroxine (SYNTHROID, LEVOTHROID) Oxycodone HCl topiramate (TOPAMAX)  If Needed: albuterol (VENTOLIN HFA)- bring inhaler with you day of surgery gabapentin (NEURONTIN)  meclizine (ANTIVERT) ondansetron (ZOFRAN) nitroGLYCERIN (NITROSTAT)- please let a nurse know if you had to take this tiZANidine (ZANAFLEX)   Follow your surgeon's instructions on when to stop Aspirin.  If no instructions were given by your surgeon then you will need to call the office to get those instructions.      As of today, STOP taking any Aleve, Naproxen, Ibuprofen, Motrin, Advil, Goody's, BC's, all herbal medications, fish oil, and all vitamins.          Do not wear jewelry or makeup Do not wear lotions, powders, perfumes/colognes, or deodorant. Do not shave 48 hours prior to surgery.  Men may shave face and neck. Do not bring valuables to the hospital. DO Not wear nail polish, gel polish, artificial nails, or any other type of covering on  natural nails including finger and toenails. If patients have artificial nails, gel coating, etc. that need to be removed by a nail salon please have this removed prior to surgery or surgery may need to be canceled/delayed if the surgeon/ anesthesia feels like the patient is unable to be adequately monitored.             [facility] is not responsible for any  belongings or valuables.  Do NOT Smoke (Tobacco/Vaping) or drink Alcohol 24 hours prior to your procedure If you use a CPAP at night, you may bring all equipment for your overnight stay.   Contacts, glasses, dentures or bridgework may not be worn into surgery, please bring cases for these belongings   For patients admitted to the hospital, discharge time will be determined by your treatment team.   Patients discharged the day of surgery will not be allowed to drive home, and someone needs to stay with them for 24 hours.  ONLY 1 SUPPORT PERSON MAY BE PRESENT WHILE YOU ARE IN SURGERY. IF YOU ARE TO BE ADMITTED ONCE YOU ARE IN YOUR ROOM YOU WILL BE ALLOWED TWO (2) VISITORS.  Minor children may have two parents present. Special consideration for safety and communication needs will be reviewed on a case by case basis.  Special instructions:    Oral Hygiene is also important to reduce your risk of infection.  Remember - BRUSH YOUR TEETH THE MORNING OF SURGERY WITH YOUR REGULAR TOOTHPASTE   [facility]- Preparing For Surgery  Before surgery, you can play an important role. Because skin is not sterile, your skin needs to be as free of germs as possible. You can reduce the number of germs on your skin by washing with CHG (chlorahexidine gluconate) Soap before surgery.  CHG is an antiseptic cleaner which kills germs and bonds with the skin to continue killing germs even after washing.     Please do not use if you  have an allergy to CHG or antibacterial soaps. If your skin becomes reddened/irritated stop using the CHG.  Do not shave (including legs and underarms) for at least 48 hours prior to first CHG shower. It is OK to shave your face.  Please follow these instructions carefully.     Shower the NIGHT BEFORE SURGERY and the MORNING OF SURGERY with CHG Soap.   If you chose to wash your hair, wash your hair first as usual with your normal shampoo. After you shampoo, rinse your hair and body  thoroughly to remove the shampoo.  Then [employer] and genitals (private parts) with your normal soap and rinse thoroughly to remove soap.  After that Use CHG Soap as you would any other liquid soap. You can apply CHG directly to the skin and wash gently with a scrungie or a clean washcloth.   Apply the CHG Soap to your body ONLY FROM THE NECK DOWN.  Do not use on open wounds or open sores. Avoid contact with your eyes, ears, mouth and genitals (private parts). Wash Face and genitals (private parts)  with your normal soap.   Wash thoroughly, paying special attention to the area where your surgery will be performed.  Thoroughly rinse your body with warm water from the neck down.  DO NOT shower/wash with your normal soap after using and rinsing off the CHG Soap.  Pat yourself dry with a CLEAN TOWEL.  Wear CLEAN PAJAMAS to bed the night before surgery  Place CLEAN SHEETS on your bed the night before your surgery  DO NOT SLEEP WITH PETS.   Day of Surgery:  Take a shower with CHG soap. Wear Clean/Comfortable clothing the morning of surgery Do not apply any deodorants/lotions.   Remember to brush your teeth WITH YOUR REGULAR TOOTHPASTE.   Please read over the following fact sheets that you were given.

## 2021-03-16 ENCOUNTER — Encounter (HOSPITAL_COMMUNITY)
Admission: RE | Admit: 2021-03-16 | Discharge: 2021-03-16 | Disposition: A | Discharge status: Home / Self Care | Payer: Medicare HMO | Source: Ambulatory Visit | Attending: Neurological Surgery | Admitting: Neurological Surgery

## 2021-03-16 ENCOUNTER — Ambulatory Visit (HOSPITAL_COMMUNITY)
Admission: RE | Admit: 2021-03-16 | Discharge: 2021-03-16 | Disposition: A | Discharge status: Home / Self Care | Payer: Medicare HMO | Source: Ambulatory Visit | Attending: Neurological Surgery | Admitting: Neurological Surgery

## 2021-03-16 ENCOUNTER — Encounter (HOSPITAL_COMMUNITY): Payer: Self-pay

## 2021-03-16 ENCOUNTER — Other Ambulatory Visit: Payer: Self-pay

## 2021-03-16 DIAGNOSIS — M4310 Spondylolisthesis, site unspecified: Secondary | ICD-10-CM

## 2021-03-16 DIAGNOSIS — Z8674 Personal history of sudden cardiac arrest: Secondary | ICD-10-CM | POA: Diagnosis not present

## 2021-03-16 DIAGNOSIS — I10 Essential (primary) hypertension: Secondary | ICD-10-CM | POA: Diagnosis not present

## 2021-03-16 DIAGNOSIS — Z01818 Encounter for other preprocedural examination: Secondary | ICD-10-CM | POA: Diagnosis not present

## 2021-03-16 DIAGNOSIS — M7138 Other bursal cyst, other site: Secondary | ICD-10-CM | POA: Diagnosis not present

## 2021-03-16 DIAGNOSIS — I341 Nonrheumatic mitral (valve) prolapse: Secondary | ICD-10-CM | POA: Diagnosis not present

## 2021-03-16 DIAGNOSIS — E271 Primary adrenocortical insufficiency: Secondary | ICD-10-CM | POA: Diagnosis not present

## 2021-03-16 DIAGNOSIS — E039 Hypothyroidism, unspecified: Secondary | ICD-10-CM | POA: Diagnosis not present

## 2021-03-16 DIAGNOSIS — Z20822 Contact with and (suspected) exposure to covid-19: Secondary | ICD-10-CM | POA: Insufficient documentation

## 2021-03-16 DIAGNOSIS — M4316 Spondylolisthesis, lumbar region: Secondary | ICD-10-CM | POA: Diagnosis not present

## 2021-03-16 DIAGNOSIS — M5126 Other intervertebral disc displacement, lumbar region: Secondary | ICD-10-CM | POA: Diagnosis not present

## 2021-03-16 DIAGNOSIS — I429 Cardiomyopathy, unspecified: Secondary | ICD-10-CM | POA: Diagnosis not present

## 2021-03-16 HISTORY — DX: Cerebral infarction, unspecified: I63.9

## 2021-03-16 HISTORY — DX: Headache, unspecified: R51.9

## 2021-03-16 HISTORY — DX: Prediabetes: R73.03

## 2021-03-16 LAB — CBC WITH DIFFERENTIAL/PLATELET
Abs Immature Granulocytes: 0.09 10*3/uL — ABNORMAL HIGH (ref 0.00–0.07)
Basophils Absolute: 0.1 10*3/uL (ref 0.0–0.1)
Basophils Relative: 1 %
Eosinophils Absolute: 0.2 10*3/uL (ref 0.0–0.5)
Eosinophils Relative: 1 %
HCT: 46.1 % — ABNORMAL HIGH (ref 36.0–46.0)
Hemoglobin: 14.7 g/dL (ref 12.0–15.0)
Immature Granulocytes: 1 %
Lymphocytes Relative: 21 %
Lymphs Abs: 3.1 10*3/uL (ref 0.7–4.0)
MCH: 29.1 pg (ref 26.0–34.0)
MCHC: 31.9 g/dL (ref 30.0–36.0)
MCV: 91.3 fL (ref 80.0–100.0)
Monocytes Absolute: 1 10*3/uL (ref 0.1–1.0)
Monocytes Relative: 7 %
Neutro Abs: 9.9 10*3/uL — ABNORMAL HIGH (ref 1.7–7.7)
Neutrophils Relative %: 69 %
Platelets: 391 10*3/uL (ref 150–400)
RBC: 5.05 MIL/uL (ref 3.87–5.11)
RDW: 13.9 % (ref 11.5–15.5)
WBC: 14.3 10*3/uL — ABNORMAL HIGH (ref 4.0–10.5)
nRBC: 0 % (ref 0.0–0.2)

## 2021-03-16 LAB — SURGICAL PCR SCREEN
MRSA, PCR: NEGATIVE
Staphylococcus aureus: POSITIVE — AB

## 2021-03-16 LAB — BASIC METABOLIC PANEL
Anion gap: 7 (ref 5–15)
BUN: 24 mg/dL — ABNORMAL HIGH (ref 6–20)
CO2: 26 mmol/L (ref 22–32)
Calcium: 9.1 mg/dL (ref 8.9–10.3)
Chloride: 105 mmol/L (ref 98–111)
Creatinine, Ser: 1.39 mg/dL — ABNORMAL HIGH (ref 0.44–1.00)
GFR, Estimated: 44 mL/min — ABNORMAL LOW (ref >60)
Glucose, Bld: 95 mg/dL (ref 70–99)
Potassium: 4.2 mmol/L (ref 3.5–5.1)
Sodium: 138 mmol/L (ref 135–145)

## 2021-03-16 LAB — TYPE AND SCREEN
ABO/RH(D): O POS
Antibody Screen: NEGATIVE

## 2021-03-16 LAB — PROTIME-INR
INR: 1 (ref 0.8–1.2)
Prothrombin Time: 12.9 seconds (ref 11.4–15.2)

## 2021-03-16 NOTE — Progress Notes (Signed)
PCP - Redmond School, MD w/ [facility]; records requested Cardiologist - Lyman Bishop, MD Endocrinologist- Fabio Asa, MD w/ [facility]  PPM/ICD - Denies  Chest x-ray - 03/16/21 EKG - 03/16/21 Stress Test - 01/08/20 ECHO - 01/26/21 Cardiac Cath - 03/07/07  Sleep Study - Denies  Pt is pre-diabetic, does not check CBGs. Last A1C was 5.7 01/31/21  Blood Thinner Instructions: N/A Aspirin Instructions: Per pt, last dose 03/11/21  ERAS Protcol - N/A PRE-SURGERY Ensure or G2- N/A  COVID TEST- 03/16/21; results pending   Anesthesia review: Yes, cardiac hx; review last PCP office note & labs  Patient denies shortness of breath, fever, cough and chest pain at PAT appointment   All instructions explained to the patient, with a verbal understanding of the material. Patient agrees to go over the instructions while at home for a better understanding. Patient also instructed to self quarantine after being tested for COVID-19. The opportunity to ask questions was provided.

## 2021-03-17 LAB — SARS CORONAVIRUS 2 (TAT 6-24 HRS): SARS Coronavirus 2: NEGATIVE

## 2021-03-17 IMAGING — DX DG LUMBAR SPINE COMPLETE 4+V
5 series · 5 of 5 positions shown · non-contrast
Comparison: Multiple previous lumbar spine series.

CLINICAL DATA: Fell last night.  Back pain.

EXAM:
LUMBAR SPINE - COMPLETE 4+ VIEW

[l-spine ap]
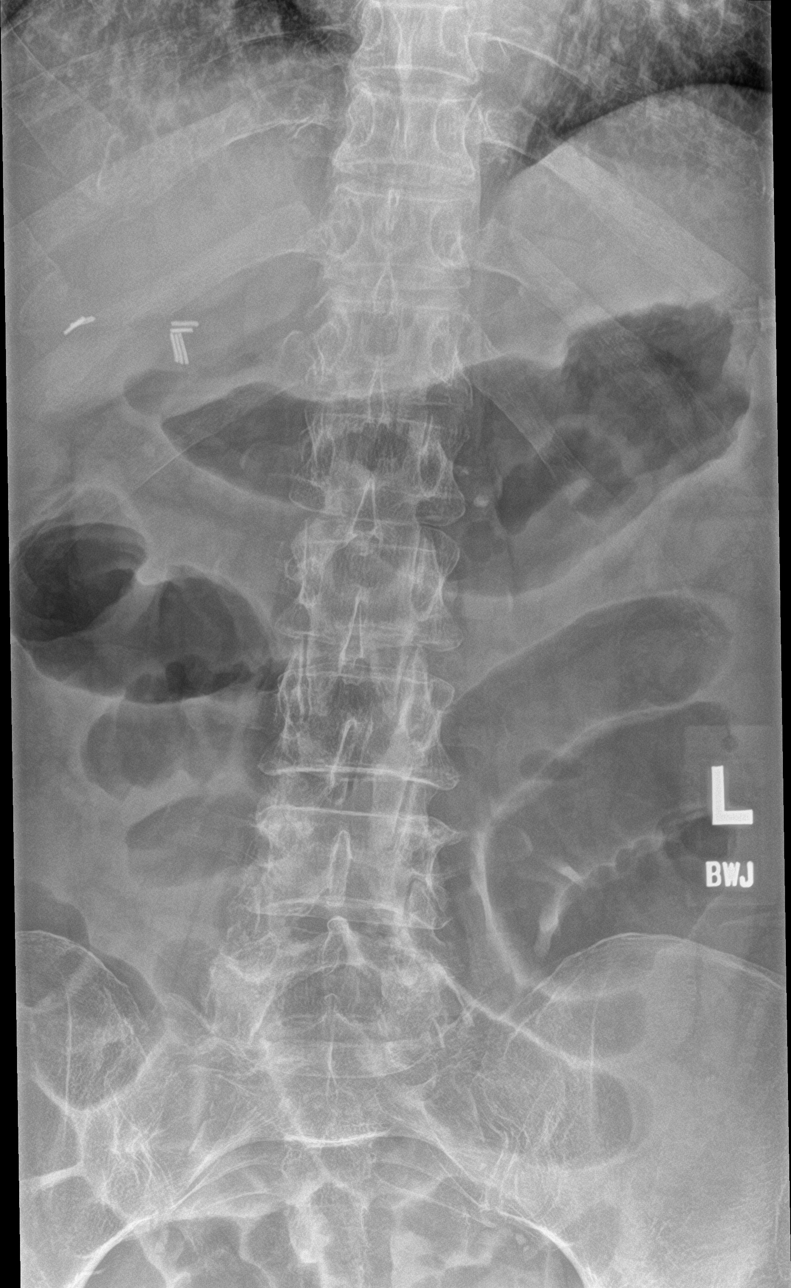

[l-spine obl (1 of 2)]
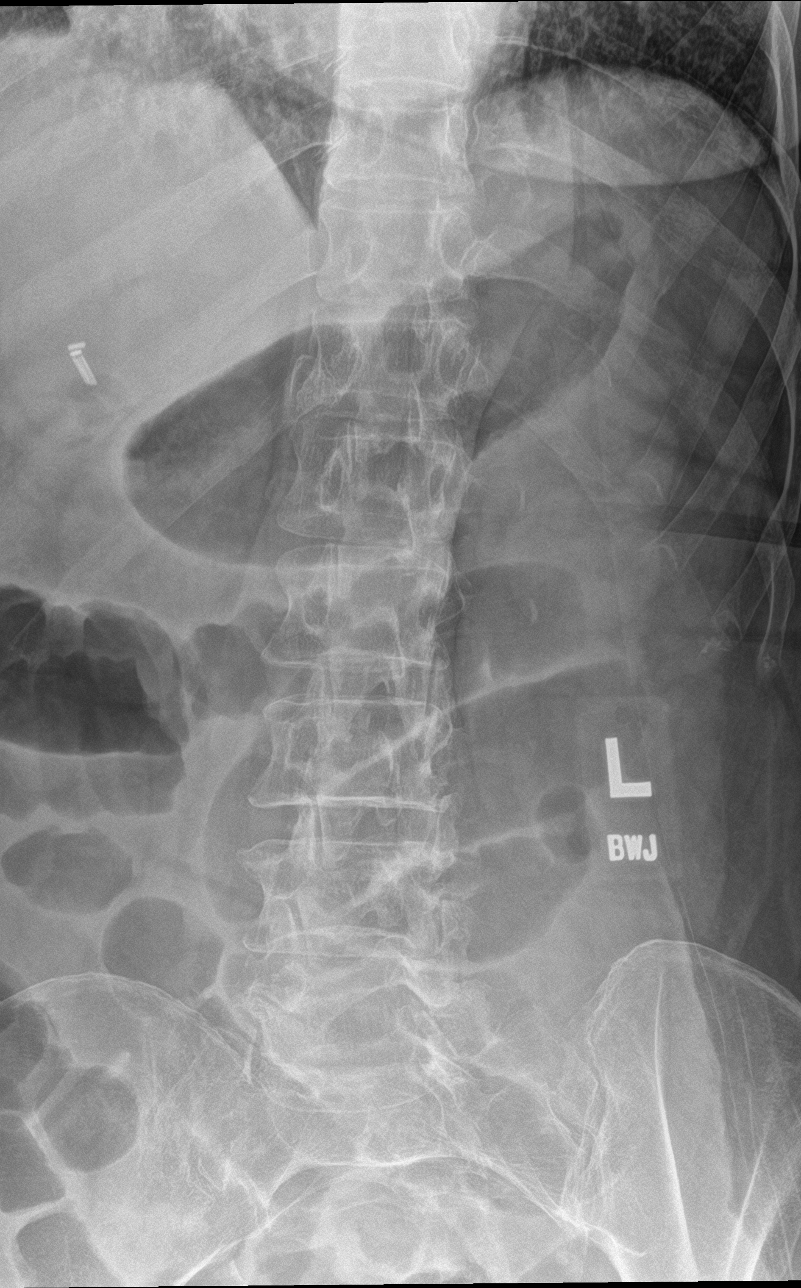

[l-spine obl (2 of 2)]
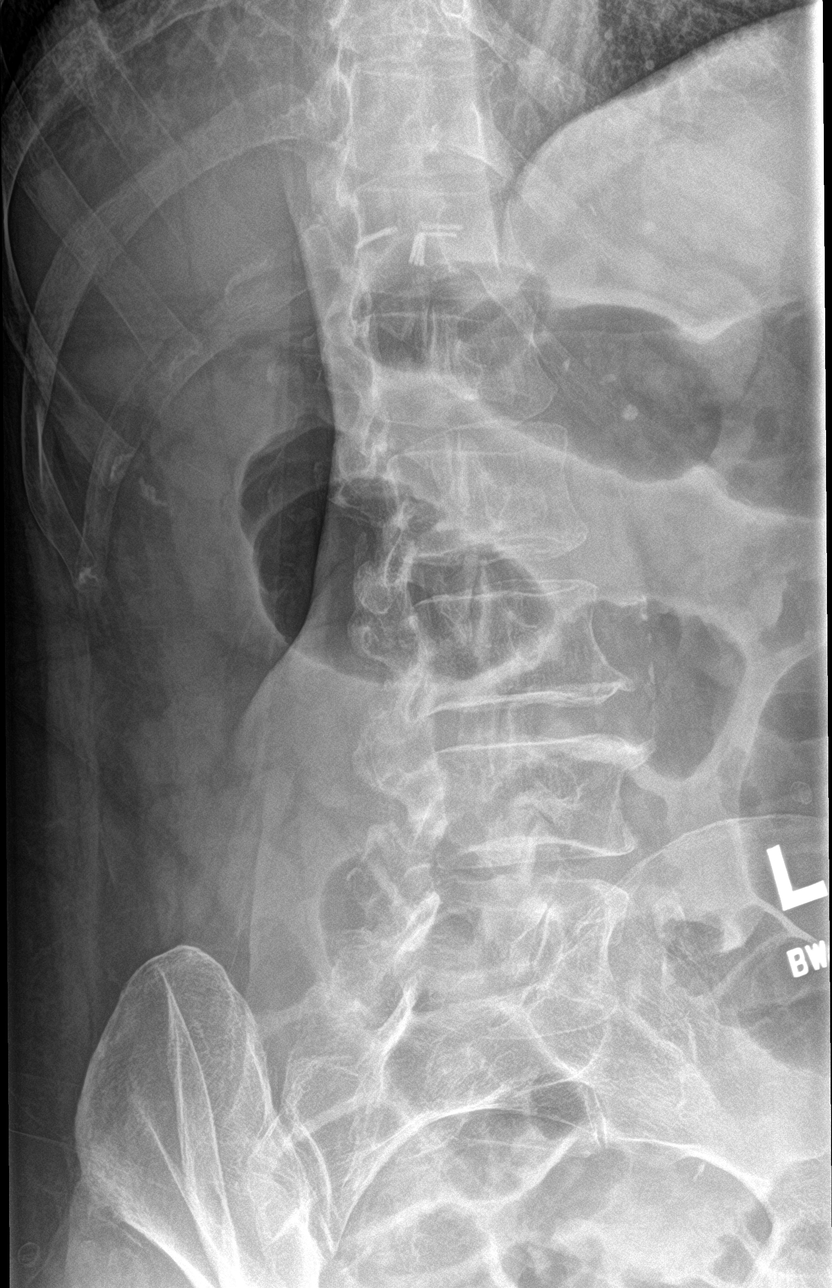

[l-spine lat]
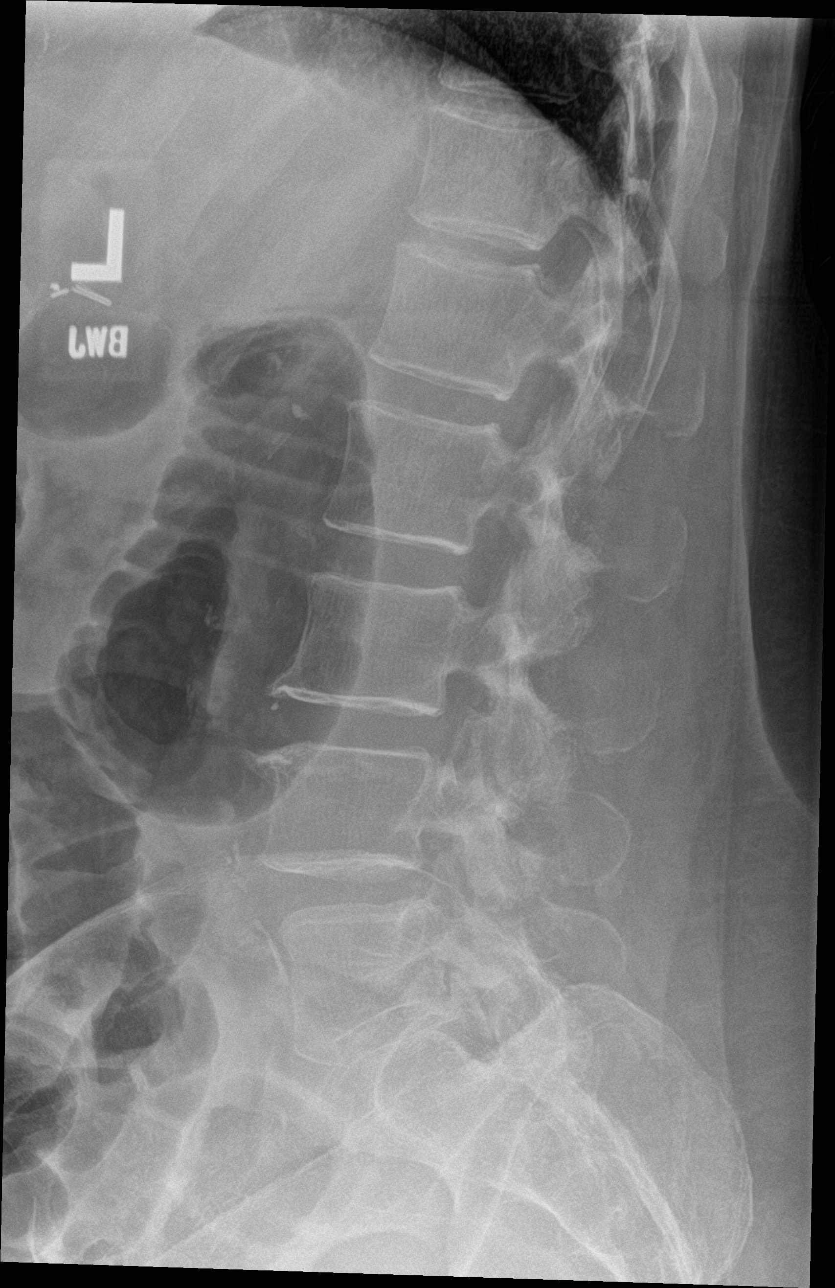

[l-spine spot]
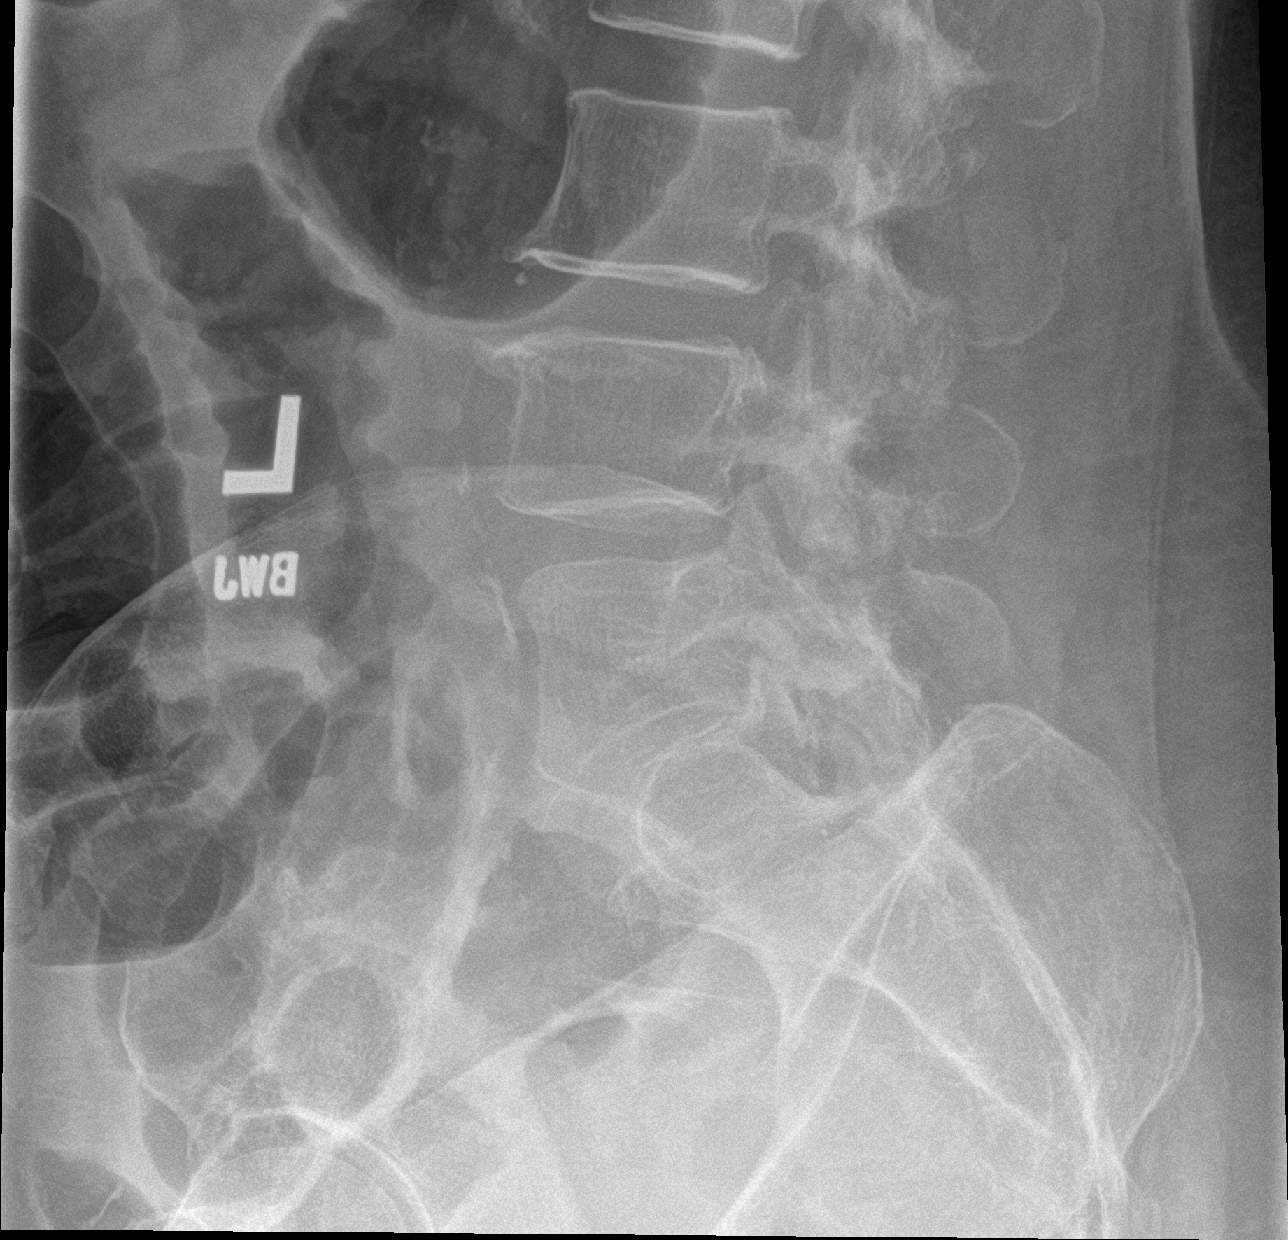

[5 of 5 positions shown; findings below may reference images not displayed]

FINDINGS: Normal alignment of the lumbar vertebral bodies. Disc spaces and
vertebral bodies are maintained. The facets are normally aligned. No
pars defects. The visualized bony pelvis is intact. Ileus appearing
bowel gas pattern.
IMPRESSION: Normal alignment and no acute bony findings or degenerative changes.

## 2021-03-17 NOTE — Anesthesia Preprocedure Evaluation (Addendum)
Anesthesia Evaluation  Patient identified by MRN, date of birth, ID band Patient awake    Reviewed: Allergy & Precautions, NPO status , Patient's Chart, lab work & pertinent test results, reviewed documented beta blocker date and time   History of Anesthesia Complications (+) PONV and history of anesthetic complications  Airway Mallampati: II  TM Distance: >3 FB Neck ROM: Full    Dental  (+) Dental Advisory Given, Edentulous Lower, Edentulous Upper   Pulmonary former smoker,    Pulmonary exam normal breath sounds clear to auscultation       Cardiovascular hypertension, Pt. on home beta blockers (-) angina+ CAD and +CHF  (-) Past MI Normal cardiovascular exam+ Valvular Problems/Murmurs MVP  Rhythm:Regular Rate:Normal  Echo 01/26/21: 1. Left ventricular ejection fraction, by estimation, is 55 to 60%. The  left ventricle has normal function. The left ventricle has no regional  wall motion abnormalities. There is mild left ventricular hypertrophy.  2. Right ventricular systolic function is normal. The right ventricular  size is normal.  3. The mitral valve is normal in structure. No evidence of mitral valve  regurgitation.  4. Aortic valve regurgitation is not visualized. Mild aortic valve  sclerosis is present, with no evidence of aortic valve stenosis.  5. The inferior vena cava is normal in size with greater than 50%  respiratory variability, suggesting right atrial pressure of 3 mmHg.    Neuro/Psych  Headaches, PSYCHIATRIC DISORDERS Anxiety Spondylolisthesis  Neuromuscular disease CVA, No Residual Symptoms    GI/Hepatic Neg liver ROS, GERD  ,Gastroparesis    Endo/Other  Hypothyroidism Addison's disease  Renal/GU Renal InsufficiencyRenal disease     Musculoskeletal  (+) Arthritis ,   Abdominal   Peds  Hematology negative hematology ROS (+)   Anesthesia Other Findings   Reproductive/Obstetrics                            Anesthesia Physical Anesthesia Plan  ASA: 3  Anesthesia Plan: General   Post-op Pain Management:    Induction: Intravenous  PONV Risk Score and Plan: 4 or greater  Airway Management Planned: Oral ETT  Additional Equipment: Arterial line  Intra-op Plan:   Post-operative Plan: Extubation in OR  Informed Consent: I have reviewed the patients History and Physical, chart, labs and discussed the procedure including the risks, benefits and alternatives for the proposed anesthesia with the patient or authorized representative who has indicated his/her understanding and acceptance.     Dental advisory given  Plan Discussed with: CRNA  Anesthesia Plan Comments: (PAT note written 03/17/2021 by Myra Gianotti, PA-C.  2nd PIV after induction.)     Anesthesia Quick Evaluation

## 2021-03-17 NOTE — Progress Notes (Signed)
Case: [id number] Date/Time: 03/18/21 0830   Procedure: PLIF - L3-L4 - L4-L5 (Back)   Anesthesia type: General   Pre-op diagnosis: Spondylolisthesis   Location: [facility] OR ROOM 43 / [place] OR   Surgeons: Eustace Moore, MD       DISCUSSION: Patient is a 55 year old female scheduled for the above procedure.   History includes former smoker (quit 01/25/21), post-operative N/V, chronic chest pain, CAD (50-60% RCA 2008, but RCA "normal" with < 10% LAD and DIAG by 08/27/15 cath, [facility]), non-ischemic cardiomyopathy (EF 40-50% 2006, EF normalized 2007), CHF, Prolonged QT, MVP, pre-diabetes, hypothyroidism, adrenal insufficiency/Addison's disease (dignosed ~ 2011; reported history of "cardiac arrest" due to Addisonian crisis), aneurysm (2 mm proximal right cavernous ICA aneurysm 04/01/16 CTA), nondiabetic gastroparesis, anxiety, neck surgery (C5-6/C6-7 ACDF 11/28/00), back surgery (L3-5 laminotomy/microdiskectomy 12/20/18).  - Admission 01/25/21-01/31/21 for multiple falls. Head CT negative in ED. UDS + opiates, benzodiazepines, THC. MRI brain showed subtle evidence of punctate acute infaction in the lower left paramedian mid brain, but neurologist Dr. Merlene Laughter consulted. He reviewed MRI and felt it was "somewhat unimpressive". He added, "There is questionable of faint increased still involving the left midbrain of questionable significance. There is minimal chronic changes seen on FLAIR imaging involving the white matter. No encephalomalacia is noted. No hemorrhages noted. The scan this mostly unrevealing." He also noted her marked stupor/encephalopathy in the hospital was "undoubtedly due to medication effect" and did improve. EEG impression listed as normal, but by notes findings suggested left temporal epileptiform discharges, so she was started on Topiramate which would hopefully help her chronic pain as well.  Discharge medications included Plavix, aspirin 81 mg, statin.  She was discharged with neurology and psychiatry  follow-up recommendations.  Per 02/23/21 neurology notation in Care Everywhere, Dr. Merlene Laughter "Cleared for surgery Stop plavix and tizanidine; she is to continue with aspirin." Topamax held. Follow-up in 3 months.   Preoperative cardiology input outline in telephone encounter by Marrianne Mood, PA-C. She noted last visit 02/18/21 with Fabian Sharp, PA-C. Patient was doing well without chest pain. Patient "understood she was at higher risk of cardiac complications during the perioperative period and wished to proceed. No additional testing was recommended at that time that would mitigate her risk. It was noted previous testing included recent 01/07/21 MPI that was nonischemic and 01/26/21 echo was with nl EF and NRWMA. She was unable to complete 4.0 METS as limited by back pain and leg numbness."   Reported last dose ASA 03/11/21.   03/16/2021 presurgical chest x-ray still in process.  03/16/2021 presurgical COVID 19 test negative.  Anesthesia team to evaluate on the day of surgery.   VS: BP 133/71   Pulse 69   Temp 36.9 C   Resp 18   Ht 5' 5.5" (1.664 m)   Wt 81.3 kg   SpO2 98%   BMI 29.37 kg/m    PROVIDERS:  Redmond School, MD is PCP - Jacelyn Pi, MD is endocrinologist - Lyman Bishop, MD is cardiologist - Phillips Odor, MD is neurologist   LABS: Preoperative labs noted.  (all labs ordered are listed, but only abnormal results are displayed)  Labs Reviewed  SURGICAL PCR SCREEN - Abnormal; Notable for the following components:      Result Value   Staphylococcus aureus POSITIVE (*)    All other components within normal limits  CBC WITH DIFFERENTIAL/PLATELET - Abnormal; Notable for the following components:   WBC 14.3 (*)    HCT 46.1 (*)  Neutro Abs 9.9 (*)    Abs Immature Granulocytes 0.09 (*)    All other components within normal limits  BASIC METABOLIC PANEL - Abnormal; Notable for the following components:   BUN 24 (*)    Creatinine, Ser 1.39 (*)    GFR, Estimated  44 (*)    All other components within normal limits  SARS CORONAVIRUS 2 (TAT 6-24 HRS)  PROTIME-INR  TYPE AND SCREEN    EEG 01/28/21: IMPRESSION: This study is within normal limits. No seizures or epileptiform discharges were seen throughout the recording.   IMAGES: CXR 03/16/21: In process.  MRI L-spine 01/28/21: IMPRESSION: Degenerative and prior postoperative changes as detailed above. There is no high-grade canal stenosis. Increased facet arthropathy at L4-L5 with marrow and soft tissue edema, fusions, and intraspinal synovial cyst compressing the traversing left L5 nerve roots. Slightly increased disc protrusion/annular fissure and left foraminal stenosis at L3-L4.  MRI Brain 01/26/21: IMPRESSION: Subtle evidence of a punctate acute infarction in the lower left paramedian mid brain. No finding was present in this location on the study of 11/14/2020. The remainder the brain is essentially normal for age, with a few punctate foci of T2 and FLAIR signal within the cerebral hemispheric white matter as seen previously.  BRAIN MRI/MRA NECK MRA 11/1120: IMPRESSION: 1. No acute intracranial abnormality. 2. Minimal cerebral white matter T2 signal changes, slightly progressed from 2017 and nonspecific with considerations including chronic small vessel ischemia, migraines, and prior infection/inflammation. No specific findings to suggest demyelinating disease. 3. Negative head MRA. 4. Negative neck MRA.   EKG: 03/16/21: NSR   CV: Echo 01/26/21 (Limited): IMPRESSIONS   1. Left ventricular ejection fraction, by estimation, is 55 to 60%. The  left ventricle has normal function. The left ventricle has no regional  wall motion abnormalities. There is mild left ventricular hypertrophy.   2. Right ventricular systolic function is normal. The right ventricular  size is normal.   3. The mitral valve is normal in structure. No evidence of mitral valve  regurgitation.   4. Aortic  valve regurgitation is not visualized. Mild aortic valve  sclerosis is present, with no evidence of aortic valve stenosis.   5. The inferior vena cava is normal in size with greater than 50%  respiratory variability, suggesting right atrial pressure of 3 mmHg.  - Comparison(s): The left ventricular function is unchanged.    Long term cardiac monitor 10/30/31-11/12/20: Study Highlights Monitor shows 2 short runs of SVT (<8 beats) and infrequent (<1%) isolated PVC's.   Echo 10/08/20: IMPRESSIONS   1. Left ventricular ejection fraction, by estimation, is 55 to 60%. The  left ventricle has normal function. The left ventricle has no regional  wall motion abnormalities. Left ventricular diastolic parameters are  consistent with Grade I diastolic  dysfunction (impaired relaxation).   2. Right ventricular systolic function is normal. The right ventricular  size is normal. There is normal pulmonary artery systolic pressure. The  estimated right ventricular systolic pressure is 50.5 mmHg.   3. Left atrial size was mildly dilated.   4. The mitral valve is normal in structure. Trivial mitral valve  regurgitation. No evidence of mitral stenosis.   5. The aortic valve is tricuspid. Aortic valve regurgitation is not  visualized. Mild aortic valve sclerosis is present, with no evidence of  aortic valve stenosis.   6. The inferior vena cava is normal in size with greater than 50%  respiratory variability, suggesting right atrial pressure of 3 mmHg.    Nuclear  stress test 01/08/20: Nuclear stress EF: 59%. The left ventricular ejection fraction is normal (55-65%). There was no ST segment deviation noted during stress. Defect 1: There is a medium defect of moderate severity present in the mid anterior and apical anterior location. This is a low risk study. Mildly abnormal, low risk stress nuclear study with probable shifting breast attenuation.  Cannot rule out very mild anterior ischemia.  Gated ejection  fraction 59% with normal wall motion.    Cardiac cath 08/27/15 ([facility]): Coronary anatomy: Right dominant Left main: Normal LAD: Less than 10% DIAG: Less than 10% LCX normal OM: Normal with a large ramus intermedius RCA: Normal PDA/PLB: Normal Left ventriculogram: Ejection fraction 60%. LVEDP 13 ASSESSMENT:  1.  Normal coronary arteries 2.  Normal LV pressure 3.  Normal LV function PLAN:   1.  Followup with PCP for noncardiac chest pain     Past Medical History:  Diagnosis Date   Addison's disease ([place])    Addison's disease ([place])    Adrenal insufficiency ([place])    diagnosed 2012   Aneurysm ([place])    Anxiety    Arthritis    Astigmatism    CAD (coronary artery disease)    Cath 2008 EF normal. RCA 50-60, Septal 50%. Myoview 3/12: EF 53% normal perfusion   Cardiac arrest ([place])    2/2 adissonian crisis   Cardiomyopathy    resolved   Chest pain    chronicc   CHF (congestive heart failure) (HCC)    Chronic back pain    Chronic diarrhea    Concussion    sept 28th 2014   Family history of breast cancer    Family history of colon cancer    Gastroparesis    GERD (gastroesophageal reflux disease)    Headache    migraines   HTN (hypertension)    Hyperlipidemia    Hypothyroidism    Mitral valve prolapse    Nondiabetic gastroparesis    PONV (postoperative nausea and vomiting)    Pre-diabetes    QT prolongation    Stroke [facility])    possible stroke 01/2021   Tobacco abuse    down to 2 cigarettes per day   Vertigo     Past Surgical History:  Procedure Laterality Date   ABDOMINAL HYSTERECTOMY     CARDIAC CATHETERIZATION  03/07/2007   showed 60% lesion in the right coronary artery   CHOLECYSTECTOMY     COLONOSCOPY     LEFT HEART CATHETERIZATION WITH CORONARY ANGIOGRAM N/A 04/13/2012   Procedure: LEFT HEART CATHETERIZATION WITH CORONARY ANGIOGRAM;  Surgeon: Hillary Bow, MD;  Location: [facility] CATH LAB;  Service: Cardiovascular;  Laterality: N/A;    LUMBAR LAMINECTOMY/DECOMPRESSION MICRODISCECTOMY Left 12/20/2018   Procedure: Left Lumbar Three-Four Lumbar Laminotomy and Foraminotomy, Lumbar Four-Five Laminotomy and Foraminotomy with Microdiscectomy and Resection of Synovial Cyst;  Surgeon: Jovita Gamma, MD;  Location: [place];  Service: Neurosurgery;  Laterality: Left;  Left Lumbar 3-4 Lumbar laminotomy, foraminotomy, possible microdiscectomy with possible resection of synovial cyst   SPINE SURGERY     VARICOSE VEIN SURGERY     VESICOVAGINAL FISTULA CLOSURE W/ TAH      MEDICATIONS:  albuterol (VENTOLIN HFA) 108 (90 Base) MCG/ACT inhaler   alendronate (FOSAMAX) 70 MG tablet   ALPRAZolam (XANAX) 0.5 MG tablet   aspirin EC 81 MG tablet   atorvastatin (LIPITOR) 40 MG tablet   buPROPion (WELLBUTRIN SR) 150 MG 12 hr tablet   carvedilol (COREG) 6.25 MG tablet  cholecalciferol (VITAMIN D) 1000 UNITS tablet   EPINEPHrine 0.3 mg/0.3 mL IJ SOAJ injection   gabapentin (NEURONTIN) 600 MG tablet   hydrocortisone (CORTEF) 10 MG tablet   levothyroxine (SYNTHROID, LEVOTHROID) 75 MCG tablet   meclizine (ANTIVERT) 25 MG tablet   nitroGLYCERIN (NITROSTAT) 0.4 MG SL tablet   ondansetron (ZOFRAN) 4 MG tablet   Oxycodone HCl 20 MG TABS   tiZANidine (ZANAFLEX) 4 MG tablet   topiramate (TOPAMAX) 25 MG tablet   zolpidem (AMBIEN) 10 MG tablet   No current facility-administered medications for this encounter.    Myra Gianotti, PA-C Surgical Short Stay/Anesthesiology [facility] Phone [phone] [facility] Phone [phone] 03/17/2021 6:51 PM

## 2021-03-18 ENCOUNTER — Inpatient Hospital Stay (HOSPITAL_COMMUNITY): Payer: Medicare HMO | Admitting: Certified Registered Nurse Anesthetist

## 2021-03-18 ENCOUNTER — Inpatient Hospital Stay (HOSPITAL_COMMUNITY)
Admission: RE | Admit: 2021-03-18 | Discharge: 2021-03-20 | DRG: 454 | Disposition: A | Discharge status: Home / Self Care | Payer: Medicare HMO | Attending: Neurological Surgery | Admitting: Neurological Surgery

## 2021-03-18 ENCOUNTER — Encounter (HOSPITAL_COMMUNITY): Payer: Self-pay | Admitting: Neurological Surgery

## 2021-03-18 ENCOUNTER — Encounter (HOSPITAL_COMMUNITY)
Admission: RE | Disposition: A | Discharge status: Home / Self Care | Payer: Self-pay | Source: Home / Self Care | Attending: Neurological Surgery

## 2021-03-18 ENCOUNTER — Other Ambulatory Visit: Payer: Self-pay

## 2021-03-18 ENCOUNTER — Inpatient Hospital Stay (HOSPITAL_COMMUNITY): Payer: Medicare HMO

## 2021-03-18 DIAGNOSIS — E271 Primary adrenocortical insufficiency: Secondary | ICD-10-CM | POA: Diagnosis present

## 2021-03-18 DIAGNOSIS — Z91048 Other nonmedicinal substance allergy status: Secondary | ICD-10-CM

## 2021-03-18 DIAGNOSIS — I341 Nonrheumatic mitral (valve) prolapse: Secondary | ICD-10-CM | POA: Diagnosis not present

## 2021-03-18 DIAGNOSIS — M5126 Other intervertebral disc displacement, lumbar region: Secondary | ICD-10-CM | POA: Diagnosis not present

## 2021-03-18 DIAGNOSIS — Z8674 Personal history of sudden cardiac arrest: Secondary | ICD-10-CM

## 2021-03-18 DIAGNOSIS — Z883 Allergy status to other anti-infective agents status: Secondary | ICD-10-CM

## 2021-03-18 DIAGNOSIS — F17210 Nicotine dependence, cigarettes, uncomplicated: Secondary | ICD-10-CM | POA: Diagnosis present

## 2021-03-18 DIAGNOSIS — Z880 Allergy status to penicillin: Secondary | ICD-10-CM | POA: Diagnosis not present

## 2021-03-18 DIAGNOSIS — Z886 Allergy status to analgesic agent status: Secondary | ICD-10-CM

## 2021-03-18 DIAGNOSIS — Z882 Allergy status to sulfonamides status: Secondary | ICD-10-CM | POA: Diagnosis not present

## 2021-03-18 DIAGNOSIS — E039 Hypothyroidism, unspecified: Secondary | ICD-10-CM | POA: Diagnosis not present

## 2021-03-18 DIAGNOSIS — I10 Essential (primary) hypertension: Secondary | ICD-10-CM | POA: Diagnosis present

## 2021-03-18 DIAGNOSIS — I2510 Atherosclerotic heart disease of native coronary artery without angina pectoris: Secondary | ICD-10-CM | POA: Diagnosis present

## 2021-03-18 DIAGNOSIS — M4316 Spondylolisthesis, lumbar region: Secondary | ICD-10-CM | POA: Diagnosis not present

## 2021-03-18 DIAGNOSIS — Z79890 Hormone replacement therapy (postmenopausal): Secondary | ICD-10-CM

## 2021-03-18 DIAGNOSIS — Z8673 Personal history of transient ischemic attack (TIA), and cerebral infarction without residual deficits: Secondary | ICD-10-CM

## 2021-03-18 DIAGNOSIS — I429 Cardiomyopathy, unspecified: Secondary | ICD-10-CM | POA: Diagnosis present

## 2021-03-18 DIAGNOSIS — Z881 Allergy status to other antibiotic agents status: Secondary | ICD-10-CM | POA: Diagnosis not present

## 2021-03-18 DIAGNOSIS — M48061 Spinal stenosis, lumbar region without neurogenic claudication: Secondary | ICD-10-CM | POA: Diagnosis present

## 2021-03-18 DIAGNOSIS — Z981 Arthrodesis status: Secondary | ICD-10-CM

## 2021-03-18 DIAGNOSIS — R7303 Prediabetes: Secondary | ICD-10-CM | POA: Diagnosis present

## 2021-03-18 DIAGNOSIS — E785 Hyperlipidemia, unspecified: Secondary | ICD-10-CM | POA: Diagnosis present

## 2021-03-18 DIAGNOSIS — Z91030 Bee allergy status: Secondary | ICD-10-CM | POA: Diagnosis not present

## 2021-03-18 DIAGNOSIS — Z20822 Contact with and (suspected) exposure to covid-19: Secondary | ICD-10-CM | POA: Diagnosis present

## 2021-03-18 DIAGNOSIS — Z7983 Long term (current) use of bisphosphonates: Secondary | ICD-10-CM

## 2021-03-18 DIAGNOSIS — Z79899 Other long term (current) drug therapy: Secondary | ICD-10-CM

## 2021-03-18 DIAGNOSIS — Z885 Allergy status to narcotic agent status: Secondary | ICD-10-CM

## 2021-03-18 DIAGNOSIS — M7138 Other bursal cyst, other site: Secondary | ICD-10-CM | POA: Diagnosis present

## 2021-03-18 DIAGNOSIS — R69 Illness, unspecified: Secondary | ICD-10-CM | POA: Diagnosis not present

## 2021-03-18 DIAGNOSIS — R911 Solitary pulmonary nodule: Secondary | ICD-10-CM | POA: Diagnosis not present

## 2021-03-18 DIAGNOSIS — M5416 Radiculopathy, lumbar region: Secondary | ICD-10-CM | POA: Diagnosis present

## 2021-03-18 DIAGNOSIS — Z419 Encounter for procedure for purposes other than remedying health state, unspecified: Secondary | ICD-10-CM

## 2021-03-18 DIAGNOSIS — M532X6 Spinal instabilities, lumbar region: Secondary | ICD-10-CM | POA: Diagnosis not present

## 2021-03-18 DIAGNOSIS — K3184 Gastroparesis: Secondary | ICD-10-CM | POA: Diagnosis present

## 2021-03-18 DIAGNOSIS — I700 Atherosclerosis of aorta: Secondary | ICD-10-CM | POA: Diagnosis not present

## 2021-03-18 DIAGNOSIS — M4326 Fusion of spine, lumbar region: Secondary | ICD-10-CM | POA: Diagnosis not present

## 2021-03-18 DIAGNOSIS — M5116 Intervertebral disc disorders with radiculopathy, lumbar region: Secondary | ICD-10-CM | POA: Diagnosis not present

## 2021-03-18 DIAGNOSIS — Z7982 Long term (current) use of aspirin: Secondary | ICD-10-CM

## 2021-03-18 LAB — GLUCOSE, CAPILLARY
Glucose-Capillary: 108 mg/dL — ABNORMAL HIGH (ref 70–99)
Glucose-Capillary: 112 mg/dL — ABNORMAL HIGH (ref 70–99)

## 2021-03-18 LAB — ABO/RH: ABO/RH(D): O POS

## 2021-03-18 SURGERY — POSTERIOR LUMBAR FUSION 2 LEVEL
Anesthesia: General | Site: Back

## 2021-03-18 MED ORDER — SODIUM CHLORIDE 0.9% FLUSH: 3.0000 mL | INTRAVENOUS | Status: DC | PRN

## 2021-03-18 MED ORDER — PROPOFOL 10 MG/ML IV BOLUS
INTRAVENOUS | Status: DC | PRN
  Administered 2021-03-18: 120 mg via INTRAVENOUS

## 2021-03-18 MED ORDER — LIDOCAINE 2% (20 MG/ML) 5 ML SYRINGE
INTRAMUSCULAR | Status: AC
  Filled 2021-03-18: qty 5

## 2021-03-18 MED ORDER — MIDAZOLAM HCL 2 MG/2ML IJ SOLN
INTRAMUSCULAR | Status: DC | PRN
  Administered 2021-03-18: 2 mg via INTRAVENOUS

## 2021-03-18 MED ORDER — KETAMINE HCL 10 MG/ML IJ SOLN
INTRAMUSCULAR | Status: DC | PRN
  Administered 2021-03-18: 10 mg via INTRAVENOUS
  Administered 2021-03-18: 20 mg via INTRAVENOUS

## 2021-03-18 MED ORDER — CHLORHEXIDINE GLUCONATE 0.12 % MT SOLN: 15.0000 mL | Freq: Once | OROMUCOSAL | Status: AC

## 2021-03-18 MED ORDER — VANCOMYCIN HCL IN DEXTROSE 1-5 GM/200ML-% IV SOLN
INTRAVENOUS | Status: AC
  Administered 2021-03-18: 1000 mg via INTRAVENOUS
  Filled 2021-03-18: qty 200

## 2021-03-18 MED ORDER — ORAL CARE MOUTH RINSE: 15.0000 mL | Freq: Once | OROMUCOSAL | Status: AC

## 2021-03-18 MED ORDER — LIDOCAINE 2% (20 MG/ML) 5 ML SYRINGE
INTRAMUSCULAR | Status: DC | PRN
  Administered 2021-03-18: 60 mg via INTRAVENOUS

## 2021-03-18 MED ORDER — THROMBIN 20000 UNITS EX SOLR
CUTANEOUS | Status: AC
  Filled 2021-03-18: qty 20000

## 2021-03-18 MED ORDER — ONDANSETRON HCL 4 MG/2ML IJ SOLN
INTRAMUSCULAR | Status: AC
  Filled 2021-03-18: qty 2

## 2021-03-18 MED ORDER — CHLORHEXIDINE GLUCONATE 0.12 % MT SOLN
OROMUCOSAL | Status: AC
  Administered 2021-03-18: 15 mL via OROMUCOSAL
  Filled 2021-03-18: qty 15

## 2021-03-18 MED ORDER — GLYCOPYRROLATE PF 0.2 MG/ML IJ SOSY
PREFILLED_SYRINGE | INTRAMUSCULAR | Status: DC | PRN
  Administered 2021-03-18: .2 mg via INTRAVENOUS

## 2021-03-18 MED ORDER — BUPIVACAINE HCL (PF) 0.25 % IJ SOLN
INTRAMUSCULAR | Status: DC | PRN
  Administered 2021-03-18: 10 mL
  Administered 2021-03-18: 8 mL

## 2021-03-18 MED ORDER — BUPROPION HCL ER (SR) 150 MG PO TB12
150.0000 mg | ORAL_TABLET | Freq: Two times a day (BID) | ORAL | Status: DC
  Administered 2021-03-18 – 2021-03-20 (×4): 150 mg via ORAL
  Filled 2021-03-18 (×4): qty 1

## 2021-03-18 MED ORDER — ACETAMINOPHEN 325 MG PO TABS
650.0000 mg | ORAL_TABLET | ORAL | Status: DC | PRN
  Administered 2021-03-18 – 2021-03-19 (×2): 650 mg via ORAL
  Filled 2021-03-18 (×2): qty 2

## 2021-03-18 MED ORDER — FENTANYL CITRATE (PF) 100 MCG/2ML IJ SOLN
25.0000 ug | INTRAMUSCULAR | Status: DC | PRN
  Administered 2021-03-18 (×3): 50 ug via INTRAVENOUS

## 2021-03-18 MED ORDER — METHOCARBAMOL 500 MG PO TABS
500.0000 mg | ORAL_TABLET | Freq: Four times a day (QID) | ORAL | Status: DC | PRN
  Administered 2021-03-18 – 2021-03-20 (×7): 500 mg via ORAL
  Filled 2021-03-18 (×6): qty 1

## 2021-03-18 MED ORDER — OXYCODONE HCL 5 MG PO TABS
10.0000 mg | ORAL_TABLET | ORAL | Status: DC | PRN
  Administered 2021-03-18 – 2021-03-19 (×4): 10 mg via ORAL
  Filled 2021-03-18 (×5): qty 2

## 2021-03-18 MED ORDER — HYDROCORTISONE 10 MG PO TABS
20.0000 mg | ORAL_TABLET | Freq: Every day | ORAL | Status: DC
  Administered 2021-03-19 – 2021-03-20 (×2): 20 mg via ORAL
  Filled 2021-03-18 (×2): qty 2

## 2021-03-18 MED ORDER — ALBUTEROL SULFATE (2.5 MG/3ML) 0.083% IN NEBU: 2.5000 mg | INHALATION_SOLUTION | Freq: Four times a day (QID) | RESPIRATORY_TRACT | Status: DC | PRN

## 2021-03-18 MED ORDER — GABAPENTIN 600 MG PO TABS: 600.0000 mg | ORAL_TABLET | Freq: Four times a day (QID) | ORAL | Status: DC | PRN

## 2021-03-18 MED ORDER — ACETAMINOPHEN 650 MG RE SUPP: 650.0000 mg | RECTAL | Status: DC | PRN

## 2021-03-18 MED ORDER — NITROGLYCERIN 0.4 MG SL SUBL: 0.4000 mg | SUBLINGUAL_TABLET | SUBLINGUAL | Status: DC | PRN

## 2021-03-18 MED ORDER — VANCOMYCIN HCL IN DEXTROSE 1-5 GM/200ML-% IV SOLN: 1000.0000 mg | INTRAVENOUS | Status: AC

## 2021-03-18 MED ORDER — 0.9 % SODIUM CHLORIDE (POUR BTL) OPTIME
TOPICAL | Status: DC | PRN
  Administered 2021-03-18: 1000 mL

## 2021-03-18 MED ORDER — SODIUM CHLORIDE 0.9% FLUSH
3.0000 mL | Freq: Two times a day (BID) | INTRAVENOUS | Status: DC
  Administered 2021-03-19 (×2): 3 mL via INTRAVENOUS

## 2021-03-18 MED ORDER — FENTANYL CITRATE (PF) 250 MCG/5ML IJ SOLN
INTRAMUSCULAR | Status: DC | PRN
  Administered 2021-03-18: 50 ug via INTRAVENOUS
  Administered 2021-03-18 (×2): 100 ug via INTRAVENOUS

## 2021-03-18 MED ORDER — THROMBIN 5000 UNITS EX SOLR: OROMUCOSAL | Status: DC | PRN

## 2021-03-18 MED ORDER — HYDROCORTISONE NA SUCCINATE PF 100 MG IJ SOLR
100.0000 mg | INTRAMUSCULAR | Status: AC
  Administered 2021-03-18: 100 mg via INTRAVENOUS
  Filled 2021-03-18: qty 2

## 2021-03-18 MED ORDER — HYDROCORTISONE 10 MG PO TABS
10.0000 mg | ORAL_TABLET | Freq: Every evening | ORAL | Status: DC
  Administered 2021-03-18 – 2021-03-19 (×2): 10 mg via ORAL
  Filled 2021-03-18 (×3): qty 1

## 2021-03-18 MED ORDER — ACETAMINOPHEN 500 MG PO TABS
1000.0000 mg | ORAL_TABLET | Freq: Four times a day (QID) | ORAL | Status: AC
  Administered 2021-03-18 – 2021-03-19 (×3): 1000 mg via ORAL
  Filled 2021-03-18 (×3): qty 2

## 2021-03-18 MED ORDER — THROMBIN 20000 UNITS EX SOLR: CUTANEOUS | Status: DC | PRN

## 2021-03-18 MED ORDER — FENTANYL CITRATE (PF) 250 MCG/5ML IJ SOLN
INTRAMUSCULAR | Status: AC
  Filled 2021-03-18: qty 5

## 2021-03-18 MED ORDER — DEXAMETHASONE SODIUM PHOSPHATE 10 MG/ML IJ SOLN: 10.0000 mg | Freq: Once | INTRAMUSCULAR | Status: DC

## 2021-03-18 MED ORDER — SUGAMMADEX SODIUM 200 MG/2ML IV SOLN
INTRAVENOUS | Status: DC | PRN
  Administered 2021-03-18: 200 mg via INTRAVENOUS

## 2021-03-18 MED ORDER — SODIUM CHLORIDE 0.9 % IV SOLN: 250.0000 mL | INTRAVENOUS | Status: DC

## 2021-03-18 MED ORDER — PROPOFOL 10 MG/ML IV BOLUS
INTRAVENOUS | Status: AC
  Filled 2021-03-18: qty 20

## 2021-03-18 MED ORDER — ACETAMINOPHEN 500 MG PO TABS: 1000.0000 mg | ORAL_TABLET | ORAL | Status: AC

## 2021-03-18 MED ORDER — GABAPENTIN 300 MG PO CAPS: 300.0000 mg | ORAL_CAPSULE | ORAL | Status: DC

## 2021-03-18 MED ORDER — MIDAZOLAM HCL 2 MG/2ML IJ SOLN
INTRAMUSCULAR | Status: AC
  Filled 2021-03-18: qty 2

## 2021-03-18 MED ORDER — KETAMINE HCL 50 MG/5ML IJ SOSY
PREFILLED_SYRINGE | INTRAMUSCULAR | Status: AC
  Filled 2021-03-18: qty 5

## 2021-03-18 MED ORDER — ROCURONIUM BROMIDE 10 MG/ML (PF) SYRINGE
PREFILLED_SYRINGE | INTRAVENOUS | Status: AC
  Filled 2021-03-18: qty 20

## 2021-03-18 MED ORDER — THROMBIN 5000 UNITS EX SOLR
CUTANEOUS | Status: AC
  Filled 2021-03-18: qty 5000

## 2021-03-18 MED ORDER — ASPIRIN EC 81 MG PO TBEC
81.0000 mg | DELAYED_RELEASE_TABLET | Freq: Every day | ORAL | Status: DC
  Administered 2021-03-18 – 2021-03-20 (×3): 81 mg via ORAL
  Filled 2021-03-18 (×3): qty 1

## 2021-03-18 MED ORDER — CHLORHEXIDINE GLUCONATE CLOTH 2 % EX PADS: 6.0000 | MEDICATED_PAD | Freq: Once | CUTANEOUS | Status: DC

## 2021-03-18 MED ORDER — PHENYLEPHRINE 40 MCG/ML (10ML) SYRINGE FOR IV PUSH (FOR BLOOD PRESSURE SUPPORT)
PREFILLED_SYRINGE | INTRAVENOUS | Status: AC
  Filled 2021-03-18: qty 20

## 2021-03-18 MED ORDER — ONDANSETRON HCL 4 MG/2ML IJ SOLN
INTRAMUSCULAR | Status: AC
  Filled 2021-03-18: qty 4

## 2021-03-18 MED ORDER — PHENOL 1.4 % MT LIQD: 1.0000 | OROMUCOSAL | Status: DC | PRN

## 2021-03-18 MED ORDER — ALBUTEROL SULFATE HFA 108 (90 BASE) MCG/ACT IN AERS: 1.0000 | INHALATION_SPRAY | Freq: Four times a day (QID) | RESPIRATORY_TRACT | Status: DC | PRN

## 2021-03-18 MED ORDER — CARVEDILOL 6.25 MG PO TABS
6.2500 mg | ORAL_TABLET | Freq: Two times a day (BID) | ORAL | Status: DC
  Administered 2021-03-18 – 2021-03-20 (×4): 6.25 mg via ORAL
  Filled 2021-03-18 (×4): qty 1

## 2021-03-18 MED ORDER — MENTHOL 3 MG MT LOZG: 1.0000 | LOZENGE | OROMUCOSAL | Status: DC | PRN

## 2021-03-18 MED ORDER — KETOROLAC TROMETHAMINE 0.5 % OP SOLN
1.0000 [drp] | Freq: Once | OPHTHALMIC | Status: AC
  Administered 2021-03-18: 1 [drp] via OPHTHALMIC

## 2021-03-18 MED ORDER — METHOCARBAMOL 500 MG PO TABS
ORAL_TABLET | ORAL | Status: AC
  Filled 2021-03-18: qty 1

## 2021-03-18 MED ORDER — DEXAMETHASONE 4 MG PO TABS
4.0000 mg | ORAL_TABLET | Freq: Four times a day (QID) | ORAL | Status: DC
  Administered 2021-03-19 – 2021-03-20 (×3): 4 mg via ORAL
  Filled 2021-03-18 (×3): qty 1

## 2021-03-18 MED ORDER — SENNA 8.6 MG PO TABS
1.0000 | ORAL_TABLET | Freq: Two times a day (BID) | ORAL | Status: DC
  Administered 2021-03-18 – 2021-03-20 (×5): 8.6 mg via ORAL
  Filled 2021-03-18 (×5): qty 1

## 2021-03-18 MED ORDER — DEXAMETHASONE SODIUM PHOSPHATE 10 MG/ML IJ SOLN
INTRAMUSCULAR | Status: AC
  Filled 2021-03-18: qty 1

## 2021-03-18 MED ORDER — ALPRAZOLAM 0.5 MG PO TABS
0.5000 mg | ORAL_TABLET | Freq: Three times a day (TID) | ORAL | Status: DC
  Administered 2021-03-18 – 2021-03-20 (×5): 0.5 mg via ORAL
  Filled 2021-03-18 (×6): qty 1

## 2021-03-18 MED ORDER — ROCURONIUM BROMIDE 10 MG/ML (PF) SYRINGE
PREFILLED_SYRINGE | INTRAVENOUS | Status: AC
  Filled 2021-03-18: qty 10

## 2021-03-18 MED ORDER — DEXAMETHASONE SODIUM PHOSPHATE 10 MG/ML IJ SOLN
INTRAMUSCULAR | Status: AC
  Filled 2021-03-18: qty 2

## 2021-03-18 MED ORDER — FENTANYL CITRATE (PF) 100 MCG/2ML IJ SOLN
INTRAMUSCULAR | Status: AC
  Filled 2021-03-18: qty 2

## 2021-03-18 MED ORDER — ACETAMINOPHEN 500 MG PO TABS
ORAL_TABLET | ORAL | Status: AC
  Administered 2021-03-18: 1000 mg via ORAL
  Filled 2021-03-18: qty 2

## 2021-03-18 MED ORDER — BUPIVACAINE HCL (PF) 0.25 % IJ SOLN
INTRAMUSCULAR | Status: AC
  Filled 2021-03-18: qty 30

## 2021-03-18 MED ORDER — TOPIRAMATE 25 MG PO TABS
25.0000 mg | ORAL_TABLET | Freq: Two times a day (BID) | ORAL | Status: DC
  Administered 2021-03-18 – 2021-03-20 (×4): 25 mg via ORAL
  Filled 2021-03-18 (×4): qty 1

## 2021-03-18 MED ORDER — VANCOMYCIN HCL IN DEXTROSE 1-5 GM/200ML-% IV SOLN
1000.0000 mg | Freq: Once | INTRAVENOUS | Status: AC
  Administered 2021-03-18: 1000 mg via INTRAVENOUS
  Filled 2021-03-18: qty 200

## 2021-03-18 MED ORDER — DEXAMETHASONE SODIUM PHOSPHATE 4 MG/ML IJ SOLN
4.0000 mg | Freq: Four times a day (QID) | INTRAMUSCULAR | Status: DC
  Administered 2021-03-18 – 2021-03-20 (×5): 4 mg via INTRAVENOUS
  Filled 2021-03-18 (×5): qty 1

## 2021-03-18 MED ORDER — GABAPENTIN 300 MG PO CAPS
ORAL_CAPSULE | ORAL | Status: AC
  Filled 2021-03-18: qty 1

## 2021-03-18 MED ORDER — KETOROLAC TROMETHAMINE 0.5 % OP SOLN
OPHTHALMIC | Status: AC
  Filled 2021-03-18: qty 5

## 2021-03-18 MED ORDER — LEVOTHYROXINE SODIUM 75 MCG PO TABS
75.0000 ug | ORAL_TABLET | Freq: Every day | ORAL | Status: DC
  Administered 2021-03-19 – 2021-03-20 (×2): 75 ug via ORAL
  Filled 2021-03-18 (×2): qty 1

## 2021-03-18 MED ORDER — ALBUMIN HUMAN 5 % IV SOLN: INTRAVENOUS | Status: DC | PRN

## 2021-03-18 MED ORDER — ROCURONIUM BROMIDE 10 MG/ML (PF) SYRINGE
PREFILLED_SYRINGE | INTRAVENOUS | Status: DC | PRN
  Administered 2021-03-18: 60 mg via INTRAVENOUS
  Administered 2021-03-18 (×2): 20 mg via INTRAVENOUS

## 2021-03-18 MED ORDER — POTASSIUM CHLORIDE IN NACL 20-0.9 MEQ/L-% IV SOLN: INTRAVENOUS | Status: DC

## 2021-03-18 MED ORDER — EPHEDRINE 5 MG/ML INJ
INTRAVENOUS | Status: AC
  Filled 2021-03-18: qty 20

## 2021-03-18 MED ORDER — METHOCARBAMOL 1000 MG/10ML IJ SOLN
500.0000 mg | Freq: Four times a day (QID) | INTRAVENOUS | Status: DC | PRN
  Filled 2021-03-18: qty 5

## 2021-03-18 MED ORDER — PHENYLEPHRINE HCL-NACL 10-0.9 MG/250ML-% IV SOLN
INTRAVENOUS | Status: DC | PRN
  Administered 2021-03-18: 25 ug/min via INTRAVENOUS

## 2021-03-18 MED ORDER — HYDROMORPHONE HCL 1 MG/ML IJ SOLN
0.5000 mg | INTRAMUSCULAR | Status: DC | PRN
  Administered 2021-03-18 – 2021-03-19 (×4): 0.5 mg via INTRAVENOUS
  Filled 2021-03-18 (×4): qty 0.5

## 2021-03-18 MED ORDER — LACTATED RINGERS IV SOLN: INTRAVENOUS | Status: DC

## 2021-03-18 MED ORDER — VITAMIN D 25 MCG (1000 UNIT) PO TABS
1000.0000 [IU] | ORAL_TABLET | Freq: Every morning | ORAL | Status: DC
  Administered 2021-03-19 – 2021-03-20 (×2): 1000 [IU] via ORAL
  Filled 2021-03-18 (×2): qty 1

## 2021-03-18 SURGICAL SUPPLY — 67 items
ADH SKN CLS APL DERMABOND .7 (GAUZE/BANDAGES/DRESSINGS) ×1
APL SKNCLS STERI-STRIP NONHPOA (GAUZE/BANDAGES/DRESSINGS) ×1
BAG COUNTER SPONGE SURGICOUNT (BAG) ×3 IMPLANT
BAG SPNG CNTER NS LX DISP (BAG) ×2
BAG SURGICOUNT SPONGE COUNTING (BAG) ×2
BASKET BONE COLLECTION (BASKET) ×3 IMPLANT
BENZOIN TINCTURE PRP APPL 2/3 (GAUZE/BANDAGES/DRESSINGS) ×3 IMPLANT
BLADE CLIPPER SURG (BLADE) IMPLANT
BONE MATRIX GEL 5CC (Bone Implant) ×2 IMPLANT
BUR CARBIDE MATCH 3.0 (BURR) ×3 IMPLANT
CANISTER SUCT 3000ML PPV (MISCELLANEOUS) ×5 IMPLANT
CLOSURE WOUND 1/2 X4 (GAUZE/BANDAGES/DRESSINGS) ×1
CNTNR URN SCR LID CUP LEK RST (MISCELLANEOUS) ×1 IMPLANT
CONT SPEC 4OZ STRL OR WHT (MISCELLANEOUS) ×3
COVER BACK TABLE 60X90IN (DRAPES) ×3 IMPLANT
DERMABOND ADVANCED (GAUZE/BANDAGES/DRESSINGS) ×2
DERMABOND ADVANCED .7 DNX12 (GAUZE/BANDAGES/DRESSINGS) ×1 IMPLANT
DRAPE C-ARM 42X72 X-RAY (DRAPES) ×4 IMPLANT
DRAPE C-ARMOR (DRAPES) ×2 IMPLANT
DRAPE LAPAROTOMY 100X72X124 (DRAPES) ×3 IMPLANT
DRAPE SURG 17X23 STRL (DRAPES) ×3 IMPLANT
DRSG OPSITE POSTOP 4X8 (GAUZE/BANDAGES/DRESSINGS) ×2 IMPLANT
DURAPREP 26ML APPLICATOR (WOUND CARE) ×3 IMPLANT
ELECT REM PT RETURN 9FT ADLT (ELECTROSURGICAL) ×3
ELECTRODE REM PT RTRN 9FT ADLT (ELECTROSURGICAL) ×1 IMPLANT
EVACUATOR 1/8 PVC DRAIN (DRAIN) ×1 IMPLANT
GAUZE 4X4 16PLY ~~LOC~~+RFID DBL (SPONGE) ×2 IMPLANT
GLOVE SURG ENC MOIS LTX SZ7 (GLOVE) ×2 IMPLANT
GLOVE SURG ENC MOIS LTX SZ8 (GLOVE) ×6 IMPLANT
GLOVE SURG POLYISO LF SZ7 (GLOVE) ×12 IMPLANT
GLOVE SURG UNDER POLY LF SZ7 (GLOVE) ×2 IMPLANT
GLOVE SURG UNDER POLY LF SZ7.5 (GLOVE) ×8 IMPLANT
GOWN STRL REUS W/ TWL LRG LVL3 (GOWN DISPOSABLE) IMPLANT
GOWN STRL REUS W/ TWL XL LVL3 (GOWN DISPOSABLE) ×2 IMPLANT
GOWN STRL REUS W/TWL 2XL LVL3 (GOWN DISPOSABLE) IMPLANT
GOWN STRL REUS W/TWL LRG LVL3 (GOWN DISPOSABLE) ×3
GOWN STRL REUS W/TWL XL LVL3 (GOWN DISPOSABLE) ×9
HEMOSTAT POWDER KIT SURGIFOAM (HEMOSTASIS) ×2 IMPLANT
KIT BASIN OR (CUSTOM PROCEDURE TRAY) ×3 IMPLANT
KIT TURNOVER KIT B (KITS) ×3 IMPLANT
MATRIX SPINE STRIP NEOCORE 5CC (Putty) IMPLANT
MILL MEDIUM DISP (BLADE) IMPLANT
NDL HYPO 25X1 1.5 SAFETY (NEEDLE) ×1 IMPLANT
NEEDLE HYPO 25X1 1.5 SAFETY (NEEDLE) ×3 IMPLANT
NS IRRIG 1000ML POUR BTL (IV SOLUTION) ×3 IMPLANT
PACK LAMINECTOMY NEURO (CUSTOM PROCEDURE TRAY) ×3 IMPLANT
PAD ARMBOARD 7.5X6 YLW CONV (MISCELLANEOUS) ×9 IMPLANT
ROD LORD LIPPED TI 5.5X70 (Rod) ×4 IMPLANT
SCREW CORT SHANK MOD 6.5X40 (Screw) ×6 IMPLANT
SCREW MOD INVICTUS 6.5X40 (Screw) ×6 IMPLANT
SCREW POLYAXIAL TULIP (Screw) ×12 IMPLANT
SET SCREW (Screw) ×18 IMPLANT
SET SCREW SPNE (Screw) IMPLANT
SPACER IDENTITI PS 11X9X25 5D (Spacer) ×8 IMPLANT
SPONGE SURGIFOAM ABS GEL 100 (HEMOSTASIS) ×3 IMPLANT
SPONGE T-LAP 4X18 ~~LOC~~+RFID (SPONGE) ×4 IMPLANT
STRIP CLOSURE SKIN 1/2X4 (GAUZE/BANDAGES/DRESSINGS) ×3 IMPLANT
STRIP MATRIX NEOCORE 5CC (Putty) ×2 IMPLANT
SUT VIC AB 0 CT1 18XCR BRD8 (SUTURE) ×1 IMPLANT
SUT VIC AB 0 CT1 8-18 (SUTURE) ×3
SUT VIC AB 2-0 CP2 18 (SUTURE) ×3 IMPLANT
SUT VIC AB 3-0 SH 8-18 (SUTURE) ×6 IMPLANT
SYR CONTROL 10ML LL (SYRINGE) ×1 IMPLANT
TOWEL GREEN STERILE (TOWEL DISPOSABLE) ×3 IMPLANT
TOWEL GREEN STERILE FF (TOWEL DISPOSABLE) ×3 IMPLANT
TRAY FOLEY MTR SLVR 16FR STAT (SET/KITS/TRAYS/PACK) ×3 IMPLANT
WATER STERILE IRR 1000ML POUR (IV SOLUTION) ×3 IMPLANT

## 2021-03-18 NOTE — Anesthesia Postprocedure Evaluation (Signed)
Anesthesia Post Note  Patient: [name]  Procedure(s) Performed: Posterior Lumbar Interbody Fusion Lumbar three-four, Lumbar four-five (Back)     Patient location during evaluation: PACU Anesthesia Type: General Level of consciousness: awake and alert Pain management: pain level controlled Vital Signs Assessment: post-procedure vital signs reviewed and stable Respiratory status: spontaneous breathing, nonlabored ventilation, respiratory function stable and patient connected to nasal cannula oxygen Cardiovascular status: blood pressure returned to baseline, stable and bradycardic Postop Assessment: no apparent nausea or vomiting Anesthetic complications: no   No notable events documented.  Last Vitals:  Vitals:   03/18/21 1315 03/18/21 1330  BP: 139/73 124/78  Pulse: (!) 57 (!) 55  Resp: 12 11  Temp:    SpO2: 98% 95%    Last Pain:  Vitals:   03/18/21 1330  TempSrc:   PainSc: Asleep    LLE Motor Response: Purposeful movement;Responds to commands (03/18/21 1330) LLE Sensation: Full sensation (03/18/21 1330) RLE Motor Response: Purposeful movement;Responds to commands (03/18/21 1330) RLE Sensation: Full sensation (03/18/21 1330)      Catalina Gravel

## 2021-03-18 NOTE — Addendum Note (Signed)
Addendum  created 03/18/21 1416 by Eligha Bridegroom, CRNA   Intraprocedure Event edited, Intraprocedure Meds edited

## 2021-03-18 NOTE — Op Note (Signed)
03/18/2021  12:27 PM  PATIENT:  [name]  55 y.o. female  PRE-OPERATIVE DIAGNOSIS: Recurrent disc herniation L3-4 on the left status post previous L3-4 decompression, recurrent synovial cyst L4-5 on the left status post previous synovial cyst resection, segmental instability L4-5 with spondylolisthesis, back pain with radiculopathy  POST-OPERATIVE DIAGNOSIS:  same  PROCEDURE:   1. Decompressive lumbar laminectomy Hemi facetectomy foraminotomies L3-4 and L4-5 requiring more work than would be required for a simple exposure of the disk for PLIF in order to adequately decompress the neural elements and address the spinal stenosis, with resection of recurrent synovial cyst L4-5 on the left 2. Posterior lumbar interbody fusion L3-4 L4-5 using PTI interbody cages packed with morcellized allograft and autograft  3. Posterior fixation L3-L5 inclusive using Alphatec cortical pedicle screws.  4. Intertransverse arthrodesis L3-L5 using morcellized autograft and allograft.  SURGEON:  Sherley Bounds, MD  ASSISTANTS: Glenford Peers FNP  ANESTHESIA:  General  EBL: 300 ml  Total I/O In: 250 [IV Piggyback:250] Out: 6283 [Urine:960; Blood:300]  BLOOD ADMINISTERED:none  DRAINS: none   INDICATION FOR PROCEDURE: This patient presented with back pain and recurrent left more than right leg pain. Imaging revealed recurrent synovial cyst L4-5 on the left with recurrent stenosis L3-4 on the left with enlarging disc herniation.. The patient tried a reasonable attempt at conservative medical measures without relief. I recommended decompression and instrumented fusion to address the stenosis as well as the segmental  instability.  Patient understood the risks, benefits, and alternatives and potential outcomes and wished to proceed.  PROCEDURE DETAILS:  The patient was brought to the operating room. After induction of generalized endotracheal anesthesia the patient was rolled into the prone position on chest  rolls and all pressure points were padded. The patient's lumbar region was cleaned and then prepped with DuraPrep and draped in the usual sterile fashion. Anesthesia was injected and then a dorsal midline incision was made and carried down to the lumbosacral fascia. The fascia was opened and the paraspinous musculature was taken down in a subperiosteal fashion to expose L3-4 and L4-5. A self-retaining retractor was placed. Intraoperative fluoroscopy confirmed my level, and I started with placement of the L3 cortical pedicle screws. The pedicle screw entry zones were identified utilizing surface landmarks and  AP and lateral fluoroscopy. I scored the cortex with the high-speed drill and then used the hand drill to drill an upward and outward direction into the pedicle. I then tapped line to line. I then placed a 6.5 x 40 mm cortical pedicle screw into the pedicles of L3 bilaterally.   I then turned my attention to the decompression and complete lumbar laminectomies, hemi- facetectomies, and foraminotomies were performed at L3-4 and L4-5.  There was significant scar tissue on the left at L3-4 and L4-5.  There is a recurrent synovial cyst at L4-5 on the left which was dissected away from the dura and removed with pituitary rongeur and a Kerrison punch.  My nurse practitioner was directly involved in the decompression and exposure of the neural elements. the patient had significant spinal stenosis and this required more work than would be required for a simple exposure of the disc for posterior lumbar interbody fusion which would only require a limited laminotomy. Much more generous decompression and generous foraminotomy was undertaken in order to adequately decompress the neural elements and address the patient's leg pain. The yellow ligament was removed to expose the underlying dura and nerve roots, and generous foraminotomies were performed to adequately  decompress the neural elements. Both the exiting and  traversing nerve roots were decompressed on both sides until a coronary dilator passed easily along the nerve roots.   Once the decompression was complete, I turned my attention to the posterior lumbar interbody fusion. The epidural venous vasculature was coagulated and cut sharply. Disc space was incised and the initial discectomy was performed with pituitary rongeurs. The disc space was distracted with sequential distractors to a height of 11 mm at both levels. We then used a series of scrapers and shavers to prepare the endplates for fusion. The midline was prepared with Epstein curettes. Once the complete discectomy was finished, we packed an appropriate sized interbody cage with local autograft and morcellized allograft, gently retracted the nerve root, and tapped the cage into position at L3-4 and L4-5.  The midline between the cages was packed with morselized autograft and allograft. We then turned our attention to the placement of the lower pedicle screws. The pedicle screw entry zones were identified utilizing surface landmarks and fluoroscopy. I drilled into each pedicle utilizing the hand drill, and tapped each pedicle with the appropriate tap. We palpated with a ball probe to assure no break in the cortex. We then placed a 6.5 x 40 mm pedicle screws into the pedicles bilaterally at L4 and L5 bilaterally.  My nurse practitioner assisted in placement of the pedicle screws.  We then decorticated the transverse processes and laid a mixture of morcellized autograft and allograft out over these to perform intertransverse arthrodesis at L3-L5. We then placed lordotic rods into the multiaxial screw heads of the pedicle screws and locked these in position with the locking caps and anti-torque device. We then checked our construct with AP and lateral fluoroscopy. Irrigated with copious amounts of bacitracin-containing saline solution. Inspected the nerve roots once again to assure adequate decompression, lined  to the dura with Gelfoam, and then we closed the muscle and the fascia with 0 Vicryl. Closed the subcutaneous tissues with 2-0 Vicryl and subcuticular tissues with 3-0 Vicryl. The skin was closed with benzoin and Steri-Strips. Dressing was then applied, the patient was awakened from general anesthesia and transported to the recovery room in stable condition. At the end of the procedure all sponge, needle and instrument counts were correct.   PLAN OF CARE: admit to inpatient  PATIENT DISPOSITION:  PACU - hemodynamically stable.   Delay start of Pharmacological VTE agent (>24hrs) due to surgical blood loss or risk of bleeding:  yes

## 2021-03-18 NOTE — Transfer of Care (Signed)
Immediate Anesthesia Transfer of Care Note  Patient: [name]  Procedure(s) Performed: Posterior Lumbar Interbody Fusion Lumbar three-four, Lumbar four-five (Back)  Patient Location: PACU  Anesthesia Type:General  Level of Consciousness: awake and alert   Airway & Oxygen Therapy: Patient Spontanous Breathing and Patient connected to nasal cannula oxygen  Post-op Assessment: Report given to RN and Post -op Vital signs reviewed and stable  Post vital signs: Reviewed and stable  Last Vitals:  Vitals Value Taken Time  BP    Temp    Pulse 61 03/18/21 1235  Resp 12 03/18/21 1235  SpO2 100 % 03/18/21 1235  Vitals shown include unvalidated device data.  Last Pain:  Vitals:   03/18/21 0737  TempSrc:   PainSc: 9       Patients Stated Pain Goal: 4 (21/58/72 7618)  Complications: No notable events documented.

## 2021-03-18 NOTE — Progress Notes (Signed)
Pharmacy Antibiotic Note  [name] is a 55 y.o. female admitted on 03/18/2021 with surgical prophylaxis.  Pharmacy has been consulted for vancomcyin dosing. Vancomycin 1gm given preop x1 Cr 1.39 Plan: Vancomycin 1gm IV x1 post op  12hr after preop dose   Height: 5\' 5"  (165.1 cm) Weight: 81.2 kg (179 lb) IBW/kg (Calculated) : 57  Temp (24hrs), Avg:97.4 F (36.3 C), Min:97 F (36.1 C), Max:97.8 F (36.6 C)  Recent Labs  Lab 03/16/21 1444  WBC 14.3*  CREATININE 1.39*    Estimated Creatinine Clearance: 47 mL/min (A) (by C-G formula based on SCr of 1.39 mg/dL (H)).    Allergies  Allergen Reactions   Bee Venom Anaphylaxis   Doxycycline Other (See Comments) and Nausea Only    Due to Pre-Existing conditions involved with stomach, patient does not take the following medication   Erythromycin Other (See Comments) and Nausea And Vomiting    Due to Pre-Existing conditions involved with stomach, patient does not take the following medication   Ibuprofen Nausea Only    gastroparesis    Penicillins Anaphylaxis and Swelling    Has patient had a PCN reaction causing immediate rash, facial/tongue/throat swelling, SOB or lightheadedness with hypotension: Yes Has patient had a PCN reaction causing severe rash involving mucus membranes or skin necrosis: Yes Has patient had a PCN reaction that required hospitalization No Has patient had a PCN reaction occurring within the last 10 years: No If all of the above answers are "NO", then may proceed with Cephalosporin use.    Sulfa Antibiotics Hives and Itching   Cat Hair Extract Hives, Itching and Swelling    SWELLING REACTION UNSPECIFIED    Dust Mite Extract Hives and Swelling   Tramadol Diarrhea and Nausea And Vomiting   Morphine And Related Itching     Bonnita Nasuti Pharm.D. CPP, BCPS Clinical Pharmacist [phone] 03/18/2021 3:30 PM

## 2021-03-18 NOTE — H&P (Signed)
Subjective: Patient is a 55 y.o. female admitted for back and leg pain. Onset of symptoms was several months ago, gradually worsening since that time.  The pain is rated severe, and is located at the across the lower back and radiates to RLE and LLE. The pain is described as aching and occurs all day. The symptoms have been progressive. Symptoms are exacerbated by exercise and standing. MRI or CT showed recurrent synovial cyst L4-5 L and worsening HNP L3-4, previous surgery both levels   Past Medical History:  Diagnosis Date   Addison's disease ([place])    Addison's disease ([place])    Adrenal insufficiency ([place])    diagnosed 2012   Aneurysm ([place])    Anxiety    Arthritis    Astigmatism    CAD (coronary artery disease)    Cath 2008 EF normal. RCA 50-60, Septal 50%. Myoview 3/12: EF 53% normal perfusion   Cardiac arrest ([place])    2/2 adissonian crisis   Cardiomyopathy    resolved   Chest pain    chronicc   CHF (congestive heart failure) (HCC)    Chronic back pain    Chronic diarrhea    Concussion    sept 28th 2014   Family history of breast cancer    Family history of colon cancer    Gastroparesis    GERD (gastroesophageal reflux disease)    Headache    migraines   HTN (hypertension)    Hyperlipidemia    Hypothyroidism    Mitral valve prolapse    Nondiabetic gastroparesis    PONV (postoperative nausea and vomiting)    Pre-diabetes    QT prolongation    Stroke [facility])    possible stroke 01/2021   Tobacco abuse    down to 2 cigarettes per day   Vertigo     Past Surgical History:  Procedure Laterality Date   ABDOMINAL HYSTERECTOMY     CARDIAC CATHETERIZATION  03/07/2007   showed 60% lesion in the right coronary artery   CHOLECYSTECTOMY     COLONOSCOPY     LEFT HEART CATHETERIZATION WITH CORONARY ANGIOGRAM N/A 04/13/2012   Procedure: LEFT HEART CATHETERIZATION WITH CORONARY ANGIOGRAM;  Surgeon: Hillary Bow, MD;  Location: [facility] CATH LAB;  Service: Cardiovascular;  Laterality:  N/A;   LUMBAR LAMINECTOMY/DECOMPRESSION MICRODISCECTOMY Left 12/20/2018   Procedure: Left Lumbar Three-Four Lumbar Laminotomy and Foraminotomy, Lumbar Four-Five Laminotomy and Foraminotomy with Microdiscectomy and Resection of Synovial Cyst;  Surgeon: Jovita Gamma, MD;  Location: [place];  Service: Neurosurgery;  Laterality: Left;  Left Lumbar 3-4 Lumbar laminotomy, foraminotomy, possible microdiscectomy with possible resection of synovial cyst   SPINE SURGERY     VARICOSE VEIN SURGERY     VESICOVAGINAL FISTULA CLOSURE W/ TAH      Prior to Admission medications   Medication Sig Start Date End Date Taking? Authorizing Provider  albuterol (VENTOLIN HFA) 108 (90 Base) MCG/ACT inhaler Inhale 1-2 puffs into the lungs every 6 (six) hours as needed for wheezing or shortness of breath.  10/22/19  Yes [provider]  alendronate (FOSAMAX) 70 MG tablet Take 70 mg by mouth every Monday. 09/15/20  Yes [provider]  ALPRAZolam Duanne Moron) 0.5 MG tablet Take 0.5 mg by mouth 3 (three) times daily. 10/09/20  Yes [provider]  aspirin EC 81 MG tablet Take 81 mg by mouth daily. Swallow whole.   Yes [provider]  atorvastatin (LIPITOR) 40 MG tablet Take 1 tablet (40 mg total) by mouth daily. 01/31/21 03/18/21  Yes Shahmehdi, Seyed A, MD  buPROPion (WELLBUTRIN SR) 150 MG 12 hr tablet Take 150 mg by mouth 2 (two) times daily.   Yes [provider]  carvedilol (COREG) 6.25 MG tablet Take 1 tablet (6.25 mg total) by mouth 2 (two) times daily with a meal. 01/31/21 03/18/21 Yes Shahmehdi, Seyed A, MD  cholecalciferol (VITAMIN D) 1000 UNITS tablet Take 1,000 Units by mouth in the morning.   Yes [provider]  EPINEPHrine 0.3 mg/0.3 mL IJ SOAJ injection Inject 0.3 mg into the muscle as needed for anaphylaxis.   Yes [provider]  gabapentin (NEURONTIN) 600 MG tablet Take 600 mg by mouth 4 (four) times daily as needed (pain). 07/25/19  Yes [provider]  hydrocortisone (CORTEF) 10 MG tablet Take 10-20 mg by mouth See admin instructions. Takes 20 mg by mouth in the morning and 10 mg at night 04/07/16  Yes [provider]  levothyroxine (SYNTHROID, LEVOTHROID) 75 MCG tablet Take 75 mcg by mouth daily before breakfast.   Yes [provider]  meclizine (ANTIVERT) 25 MG tablet 1 or 2 tabs PO q8h prn dizziness Patient taking differently: Take 25-50 mg by mouth every 8 (eight) hours as needed for dizziness. 04/16/16  Yes Francine Graven, DO  nitroGLYCERIN (NITROSTAT) 0.4 MG SL tablet Place 1 tablet (0.4 mg total) under the tongue every 5 (five) minutes as needed for chest pain. 09/17/20  Yes Hilty, Nadean Corwin, MD  ondansetron (ZOFRAN) 4 MG tablet Take 4 mg by mouth every 6 (six) hours as needed for nausea or vomiting. 02/07/21  Yes [provider]  Oxycodone HCl 20 MG TABS Take 20 mg by mouth in the morning, at noon, in the evening, and at bedtime. 07/27/19  Yes [provider]  tiZANidine (ZANAFLEX) 4 MG tablet Take 4 mg by mouth 4 (four) times daily as needed for muscle spasms. 02/15/21  Yes [provider]  topiramate (TOPAMAX) 25 MG tablet Take 1 tablet (25 mg total) by mouth 2 (two) times daily. 01/31/21 03/18/21 Yes Shahmehdi, Seyed A, MD  zolpidem (AMBIEN) 10 MG tablet Take 10 mg by mouth at bedtime. 02/07/21  Yes [provider]   Allergies  Allergen Reactions   Bee Venom Anaphylaxis   Doxycycline Other (See Comments) and Nausea Only    Due to Pre-Existing conditions involved with stomach, patient does not take the following medication   Erythromycin Other (See Comments) and Nausea And Vomiting    Due to Pre-Existing conditions involved with stomach, patient does not take the following medication   Ibuprofen Nausea Only    gastroparesis    Penicillins Anaphylaxis and Swelling    Has patient had a PCN reaction causing immediate rash, facial/tongue/throat swelling, SOB or lightheadedness with  hypotension: Yes Has patient had a PCN reaction causing severe rash involving mucus membranes or skin necrosis: Yes Has patient had a PCN reaction that required hospitalization No Has patient had a PCN reaction occurring within the last 10 years: No If all of the above answers are "NO", then may proceed with Cephalosporin use.    Sulfa Antibiotics Hives and Itching   Cat Hair Extract Hives, Itching and Swelling    SWELLING REACTION UNSPECIFIED    Dust Mite Extract Hives and Swelling   Tramadol Diarrhea and Nausea And Vomiting   Morphine And Related Itching    Social History   Tobacco Use   Smoking status: Former    Packs/day: 0.25    Years: 10.00  Pack years: 2.50    Types: Cigarettes    Quit date: 01/25/2021    Years since quitting: 0.1   Smokeless tobacco: Never   Tobacco comments:    smokes 2 a day  Substance Use Topics   Alcohol use: No    Family History  Problem Relation Age of Onset   Cancer Father        Colon   Coronary artery disease Other    Heart attack Mother        d. 92   Breast cancer Paternal Uncle 31   Dementia Maternal Grandmother    Leukemia Maternal Uncle 1     Review of Systems  Positive ROS: neg  All other systems have been reviewed and were otherwise negative with the exception of those mentioned in the HPI and as above.  Objective: Vital signs in last 24 hours: Temp:  [97.8 F (36.6 C)] 97.8 F (36.6 C) (07/13 0721) Pulse Rate:  [54] 54 (07/13 0721) Resp:  [18] 18 (07/13 0721) BP: (108)/(53) 108/53 (07/13 0721) SpO2:  [96 %] 96 % (07/13 0721) Weight:  [81.2 kg] 81.2 kg (07/13 0721)  General Appearance: Alert, cooperative, no distress, appears stated age Head: Normocephalic, without obvious abnormality, atraumatic Eyes: PERRL, conjunctiva/corneas clear, EOM's intact    Neck: Supple, symmetrical, trachea midline Back: Symmetric, no curvature, ROM normal, no CVA tenderness Lungs:  respirations unlabored Heart: Regular rate and  rhythm Abdomen: Soft, non-tender Extremities: Extremities normal, atraumatic, no cyanosis or edema Pulses: 2+ and symmetric all extremities Skin: Skin color, texture, turgor normal, no rashes or lesions  NEUROLOGIC:   Mental status: Alert and oriented x4,  no aphasia, good attention span, fund of knowledge, and memory Motor Exam - grossly normal Sensory Exam - grossly normal Reflexes: 1+ Coordination - grossly normal Gait - grossly normal Balance - grossly normal Cranial Nerves: I: smell Not tested  II: visual acuity  OS: nl    OD: nl  II: visual fields Full to confrontation  II: pupils Equal, round, reactive to light  III,VII: ptosis None  III,IV,VI: extraocular muscles  Full ROM  V: mastication Normal  V: facial light touch sensation  Normal  V,VII: corneal reflex  Present  VII: facial muscle function - upper  Normal  VII: facial muscle function - lower Normal  VIII: hearing Not tested  IX: soft palate elevation  Normal  IX,X: gag reflex Present  XI: trapezius strength  5/5  XI: sternocleidomastoid strength 5/5  XI: neck flexion strength  5/5  XII: tongue strength  Normal    Data Review Lab Results  Component Value Date   WBC 14.3 (H) 03/16/2021   HGB 14.7 03/16/2021   HCT 46.1 (H) 03/16/2021   MCV 91.3 03/16/2021   PLT 391 03/16/2021   Lab Results  Component Value Date   NA 138 03/16/2021   K 4.2 03/16/2021   CL 105 03/16/2021   CO2 26 03/16/2021   BUN 24 (H) 03/16/2021   CREATININE 1.39 (H) 03/16/2021   GLUCOSE 95 03/16/2021   Lab Results  Component Value Date   INR 1.0 03/16/2021    Assessment/Plan:  Estimated body mass index is 29.79 kg/m as calculated from the following:   Height as of this encounter: 5\' 5"  (1.651 m).   Weight as of this encounter: 81.2 kg. Patient admitted for PLIF L3-4 L4-5. Patient has failed a reasonable attempt at conservative therapy.  I explained the condition and procedure to the patient and answered any questions.  Patient wishes to proceed with procedure as planned. Understands risks/ benefits and typical outcomes of procedure.   Eustace Moore 03/18/2021 8:47 AM

## 2021-03-18 NOTE — Anesthesia Procedure Notes (Signed)
Procedure Name: Intubation Date/Time: 03/18/2021 8:55 AM Performed by: Reece Agar, CRNA Pre-anesthesia Checklist: Patient identified, Emergency Drugs available, Suction available and Patient being monitored Patient Re-evaluated:Patient Re-evaluated prior to induction Oxygen Delivery Method: Circle System Utilized Preoxygenation: Pre-oxygenation with 100% oxygen Induction Type: IV induction Ventilation: Mask ventilation without difficulty Laryngoscope Size: Mac and 3 Grade View: Grade II Tube type: Oral Tube size: 7.0 mm Number of attempts: 1 Airway Equipment and Method: Stylet and Oral airway Placement Confirmation: ETT inserted through vocal cords under direct vision, positive ETCO2 and breath sounds checked- equal and bilateral Secured at: 22 cm Tube secured with: Tape Dental Injury: Teeth and Oropharynx as per pre-operative assessment  Comments: Performed by Sophronia Simas, SRNA under the supervision of CRNA and MDA

## 2021-03-19 MED ORDER — KETOROLAC TROMETHAMINE 0.5 % OP SOLN
1.0000 [drp] | OPHTHALMIC | Status: AC | PRN
  Administered 2021-03-19: 1 [drp] via OPHTHALMIC

## 2021-03-19 MED ORDER — OXYCODONE HCL 5 MG PO TABS
20.0000 mg | ORAL_TABLET | Freq: Four times a day (QID) | ORAL | Status: DC | PRN
  Administered 2021-03-19 – 2021-03-20 (×4): 20 mg via ORAL
  Filled 2021-03-19 (×4): qty 4

## 2021-03-19 NOTE — Evaluation (Signed)
Physical Therapy Evaluation Patient Details Name: [name] MRN: [id number] DOB: Mar 12, 1966 Today's Date: 03/19/2021   History of Present Illness  55 y/o female who presents s/p L3-L5 PLIF on 03/18/2021. PMH significant for addison's disease, arthritis, CAD, cardiac arrest, CHF, HTN, stroke, vertigo, and lumbar spinal surgery 2020.   Clinical Impression  Pt admitted with above diagnosis. At the time of PT eval, pt was able to demonstrate transfers and ambulation with gross min guard assist to min assist and no AD. Pt was educated on precautions, brace application/wearing schedule (verbally as brace not present), appropriate activity progression, and car transfer. Pt currently with functional limitations due to the deficits listed below (see PT Problem List). Pt will benefit from skilled PT to increase their independence and safety with mobility to allow discharge to the venue listed below.      Follow Up Recommendations No PT follow up;Supervision for mobility/OOB    Equipment Recommendations  None recommended by PT    Recommendations for Other Services       Precautions / Restrictions Precautions Precautions: Back Precaution Booklet Issued: Yes (comment) Precaution Comments: All education reviewed, pt dremonstrating and verbalizing understanding. Required Braces or Orthoses: Spinal Brace Spinal Brace: Lumbar corset (not present during PT eval - pt reports it is at home.) Restrictions Weight Bearing Restrictions: No      Mobility  Bed Mobility Overal bed mobility: Needs Assistance Bed Mobility: Rolling;Sidelying to Sit;Sit to Sidelying Rolling: Supervision Sidelying to sit: Min guard     Sit to sidelying: Min guard General bed mobility comments: Min Guard for safety    Transfers Overall transfer level: Needs assistance Equipment used: None Transfers: Sit to/from Stand Sit to Stand: Supervision         General transfer comment: Supervision for  safety.  Ambulation/Gait Ambulation/Gait assistance: Min guard;Min assist Gait Distance (Feet): 200 Feet Assistive device: None Gait Pattern/deviations: Step-through pattern;Decreased stride length;Trunk flexed;Narrow base of support Gait velocity: Decreased Gait velocity interpretation: <1.31 ft/sec, indicative of household ambulator General Gait Details: Slow and guarded with occasional mild lateral LOB. Able to make minimal corrective changes with step/stride length with cues but unable to maintain.  Stairs            Wheelchair Mobility    Modified Rankin (Stroke Patients Only)       Balance Overall balance assessment: Mild deficits observed, not formally tested                                           Pertinent Vitals/Pain Faces Pain Scale: Hurts even more Pain Location: back Pain Descriptors / Indicators: Operative site guarding;Constant;Discomfort    Home Living Family/patient expects to be discharged to:: Private residence Living Arrangements: Children Available Help at Discharge: Family;Available 24 hours/day Type of Home: Apartment Home Access: Level entry     Home Layout: One level Home Equipment: Cane - single point;Walker - 2 wheels;Grab bars - tub/shower;Hand held shower head;Toilet riser;Shower seat Additional Comments: Pt reporting family will be available 24/7 for several days following discharge.    Prior Function Level of Independence: Needs assistance      ADL's / Homemaking Assistance Needed: assistance with LB dressing at baseline.  Comments: Family available for assistance at discharge     Hand Dominance   Dominant Hand: Right    Extremity/Trunk Assessment   Upper Extremity Assessment RUE Deficits / Details: reporting numbness  in R hand, reporting her doctor states it is from cervical stenosis.    Lower Extremity Assessment Lower Extremity Assessment: Generalized weakness (COnsistent with pre-op diagnosis)     Cervical / Trunk Assessment Cervical / Trunk Assessment: Other exceptions Cervical / Trunk Exceptions: s/p lumbar spinal surgery  Communication   Communication: No difficulties  Cognition Arousal/Alertness: Awake/alert Behavior During Therapy: WFL for tasks assessed/performed Overall Cognitive Status: Within Functional Limits for tasks assessed                                 General Comments: Pt recalling 3/3 back precautions. Pleasant and conversational throughout session.      General Comments      Exercises     Assessment/Plan    PT Assessment Patient needs continued PT services  PT Problem List Decreased strength;Decreased activity tolerance;Decreased balance;Decreased mobility;Decreased knowledge of use of DME;Decreased safety awareness;Decreased knowledge of precautions;Pain       PT Treatment Interventions DME instruction;Gait training;Functional mobility training;Therapeutic activities;Therapeutic exercise;Neuromuscular re-education;Patient/family education    PT Goals (Current goals can be found in the Care Plan section)  Acute Rehab PT Goals Patient Stated Goal: no pain PT Goal Formulation: With patient Time For Goal Achievement: 03/26/21 Potential to Achieve Goals: Good    Frequency Min 5X/week   Barriers to discharge        Co-evaluation               AM-PAC PT "6 Clicks" Mobility  Outcome Measure Help needed turning from your back to your side while in a flat bed without using bedrails?: None Help needed moving from lying on your back to sitting on the side of a flat bed without using bedrails?: A Little Help needed moving to and from a bed to a chair (including a wheelchair)?: A Little Help needed standing up from a chair using your arms (e.g., wheelchair or bedside chair)?: A Little Help needed to walk in hospital room?: A Little Help needed climbing 3-5 steps with a railing? : A Little 6 Click Score: 19    End of Session  Equipment Utilized During Treatment: Gait belt Activity Tolerance: Patient tolerated treatment well Patient left: in chair;with call bell/phone within reach;with family/visitor present Nurse Communication: Mobility status PT Visit Diagnosis: Unsteadiness on feet (R26.81);Pain Pain - part of body:  (back)    Time: 6962-9528 PT Time Calculation (min) (ACUTE ONLY): 23 min   Charges:   PT Evaluation $PT Eval Low Complexity: 1 Low PT Treatments $Gait Training: 8-22 mins        [name], PT, DPT Acute Rehabilitation Services Pager: [phone] Office: [phone]   [name] 03/19/2021, 1:17 PM

## 2021-03-19 NOTE — Progress Notes (Signed)
Subjective: Patient reports appropriate back soreness, no leg pain  Objective: Vital signs in last 24 hours: Temp:  [97 F (36.1 C)-98.7 F (37.1 C)] 98.2 F (36.8 C) (07/14 0700) Pulse Rate:  [52-65] 64 (07/14 0700) Resp:  [10-20] 18 (07/14 0700) BP: (120-159)/(59-94) 135/67 (07/14 0700) SpO2:  [95 %-100 %] 96 % (07/14 0700)  Intake/Output from previous day: 07/13 0701 - 07/14 0700 In: 250 [IV Piggyback:250] Out: 1435 [Urine:1135; Blood:300] Intake/Output this shift: No intake/output data recorded.  Neurologic: Grossly normal  Lab Results: Lab Results  Component Value Date   WBC 14.3 (H) 03/16/2021   HGB 14.7 03/16/2021   HCT 46.1 (H) 03/16/2021   MCV 91.3 03/16/2021   PLT 391 03/16/2021   Lab Results  Component Value Date   INR 1.0 03/16/2021   BMET Lab Results  Component Value Date   NA 138 03/16/2021   K 4.2 03/16/2021   CL 105 03/16/2021   CO2 26 03/16/2021   GLUCOSE 95 03/16/2021   BUN 24 (H) 03/16/2021   CREATININE 1.39 (H) 03/16/2021   CALCIUM 9.1 03/16/2021    Studies/Results: DG Lumbar Spine 2-3 Views  Result Date: 03/18/2021 CLINICAL DATA:  Surgery, elective. Additional history provided: L3-4/L4-5 PLIF. Provided fluoroscopy time 1 minutes, 27 seconds (69.22 mGy). EXAM: LUMBAR SPINE - 2-3 VIEW; DG C-ARM 1-60 MIN COMPARISON:  Lumbar spine MRI 01/28/2021. FINDINGS: AP and lateral view intraoperative fluoroscopic images of the lumbar spine are submitted, 2 images total. The lowest well-formed intervertebral disc space is designated L5-S1. On the provided images, a posterior spinal fusion construct spans the L3-L5 levels (bilateral pedicle screws and vertical interconnecting rods). Interbody devices also present at L3-L4 and L4-L5. IMPRESSION: Two intraoperative fluoroscopic images of the lumbar spine from L3-L5 posterior fusion, as described. Electronically Signed   By: Kellie Simmering DO   On: 03/18/2021 12:33   DG C-Arm 1-60 Min  Result Date:  03/18/2021 CLINICAL DATA:  Surgery, elective. Additional history provided: L3-4/L4-5 PLIF. Provided fluoroscopy time 1 minutes, 27 seconds (69.22 mGy). EXAM: LUMBAR SPINE - 2-3 VIEW; DG C-ARM 1-60 MIN COMPARISON:  Lumbar spine MRI 01/28/2021. FINDINGS: AP and lateral view intraoperative fluoroscopic images of the lumbar spine are submitted, 2 images total. The lowest well-formed intervertebral disc space is designated L5-S1. On the provided images, a posterior spinal fusion construct spans the L3-L5 levels (bilateral pedicle screws and vertical interconnecting rods). Interbody devices also present at L3-L4 and L4-L5. IMPRESSION: Two intraoperative fluoroscopic images of the lumbar spine from L3-L5 posterior fusion, as described. Electronically Signed   By: Kellie Simmering DO   On: 03/18/2021 12:33    Assessment/Plan: Seems to be doing well, had long talk with her regarding her meds and pain control. Home tomorrow. Pt/ot today  Estimated body mass index is 29.79 kg/m as calculated from the following:   Height as of this encounter: 5\' 5"  (1.651 m).   Weight as of this encounter: 81.2 kg.    LOS: 1 day    Eustace Moore 03/19/2021, 8:28 AM

## 2021-03-19 NOTE — Evaluation (Signed)
Occupational Therapy Evaluation/Discharge  Patient Details Name: [name] MRN: [id number] DOB: 1965-11-14 Today's Date: 03/19/2021    History of Present Illness 55 y.o. female s/p PMH significant for addison's disease, arthritis, CAD, cardiac arrest, CHF, HTN, stroke, vertigo, and lumbar spinal surgery.   Clinical Impression   PTA, pt lived with her children and needed assistance with LB dressing. Currently, pt performing LB dressing with Min A to don L sock. Performing UB ADLs and functional mobility with supervision for safety. Pt educated and demonstrating compensatory techniques for LB dressing, toileting, oral care, bed mobility, and tub/shower transfer within precautions. All questions answered. Recommend discharge home with intermittent assistance from family. Re-consult if change in status. OT to sign off.     Follow Up Recommendations  No OT follow up    Equipment Recommendations  None recommended by OT    Recommendations for Other Services       Precautions / Restrictions Precautions Precautions: Back Precaution Booklet Issued: Yes (comment) Precaution Comments: All education reviewed, pt dremonstrating and verbalizing understanding. Required Braces or Orthoses: Spinal Brace Spinal Brace: Lumbar corset (brace not in room during OT eval.) Restrictions Weight Bearing Restrictions: No      Mobility Bed Mobility Overal bed mobility: Needs Assistance Bed Mobility: Rolling;Sidelying to Sit;Sit to Sidelying Rolling: Supervision Sidelying to sit: Min guard     Sit to sidelying: Min guard General bed mobility comments: Min Guard for safety    Transfers Overall transfer level: Needs assistance Equipment used: None Transfers: Sit to/from Stand Sit to Stand: Supervision         General transfer comment: Supervision for safety.    Balance Overall balance assessment: Mild deficits observed, not formally tested                                          ADL either performed or assessed with clinical judgement   ADL Overall ADL's : Needs assistance/impaired Eating/Feeding: Set up;Sitting   Grooming: Supervision/safety;Standing   Upper Body Bathing: Supervision/ safety;Sitting   Lower Body Bathing: Supervison/ safety;Sit to/from stand   Upper Body Dressing : Set up;Sitting   Lower Body Dressing: Minimal assistance;Sit to/from stand Lower Body Dressing Details (indicate cue type and reason): Min A to don L sock. Pt reporting family available to assist at discharge. Toilet Transfer: Supervision/safety;Ambulation;Comfort height toilet   Toileting- Clothing Manipulation and Hygiene: Supervision/safety;Sit to/from stand Toileting - Clothing Manipulation Details (indicate cue type and reason): Pt reporting concern for posterior pericare, reviewed compensatory techniques and educated on AE Tub/ Shower Transfer: Tub transfer;Supervision/safety;Ambulation   Functional mobility during ADLs: Supervision/safety General ADL Comments: Pt educated and demonstrating compensatory techniques for LB dressing, oral care, shower transfer, and toileting within precautions.     Vision Baseline Vision/History: Wears glasses Wears Glasses: Reading only Patient Visual Report: No change from baseline Vision Assessment?: No apparent visual deficits     Perception Perception Perception Tested?: No Comments: no apparent difficulty with perception   Praxis Praxis Praxis tested?: Not tested Praxis-Other Comments: no apparent difficulty with motor planning.    Pertinent Vitals/Pain Pain Assessment: Faces Faces Pain Scale: Hurts even more Pain Location: back Pain Descriptors / Indicators: Operative site guarding;Constant;Discomfort Pain Intervention(s): Limited activity within patient's tolerance;Monitored during session;Premedicated before session     Hand Dominance Right   Extremity/Trunk Assessment Upper Extremity Assessment Upper  Extremity Assessment: Generalized weakness;RUE deficits/detail RUE Deficits / Details:  reporting numbness in R hand, reporting her doctor states it is from cervical stenosis.   Lower Extremity Assessment Lower Extremity Assessment: Defer to PT evaluation   Cervical / Trunk Assessment Cervical / Trunk Assessment: Other exceptions Cervical / Trunk Exceptions: s/p lumbar spinal surgery   Communication Communication Communication: No difficulties   Cognition Arousal/Alertness: Awake/alert Behavior During Therapy: WFL for tasks assessed/performed Overall Cognitive Status: Within Functional Limits for tasks assessed                                 General Comments: Pt recalling 3/3 back precautions. Pleasant and conversational throughout session.   General Comments  Pt reporting family available to assist at discharge. All education reviewed.    Exercises     Shoulder Instructions      Home Living Family/patient expects to be discharged to:: Private residence Living Arrangements: Children Available Help at Discharge: Family;Available 24 hours/day Type of Home: Apartment Home Access: Level entry     Home Layout: One level     Bathroom Shower/Tub: Teacher, early years/pre: Standard     Home Equipment: Cane - single point;Walker - 2 wheels;Grab bars - tub/shower;Hand held shower head;Toilet riser;Shower seat   Additional Comments: Pt reporting family will be available 24/7 for several days following discharge.      Prior Functioning/Environment Level of Independence: Needs assistance    ADL's / Homemaking Assistance Needed: assistance with LB dressing at baseline.   Comments: Family available for assistance at discharge        OT Problem List: Decreased strength;Decreased activity tolerance;Decreased knowledge of precautions;Decreased knowledge of use of DME or AE;Pain      OT Treatment/Interventions:      OT Goals(Current goals can be  found in the care plan section) Acute Rehab OT Goals Patient Stated Goal: no pain OT Goal Formulation: With patient  OT Frequency:     Barriers to D/C:            Co-evaluation              AM-PAC OT "6 Clicks" Daily Activity     Outcome Measure Help from another person eating meals?: None Help from another person taking care of personal grooming?: A Little Help from another person toileting, which includes using toliet, bedpan, or urinal?: A Little Help from another person bathing (including washing, rinsing, drying)?: A Little Help from another person to put on and taking off regular upper body clothing?: A Little Help from another person to put on and taking off regular lower body clothing?: A Little 6 Click Score: 19   End of Session Equipment Utilized During Treatment: Gait belt Nurse Communication: Mobility status  Activity Tolerance: Patient tolerated treatment well Patient left: in bed;with call bell/phone within reach  OT Visit Diagnosis: Unsteadiness on feet (R26.81);Muscle weakness (generalized) (M62.81);Pain Pain - part of body:  (back, hips)                Time: 9449-6759 OT Time Calculation (min): 23 min Charges:  OT General Charges $OT Visit: 1 Visit OT Evaluation $OT Eval Moderate Complexity: 1 Mod OT Treatments $Self Care/Home Management : 8-22 mins  Shanda Howells, OTDS   Shanda Howells 03/19/2021, 9:55 AM

## 2021-03-19 NOTE — Progress Notes (Signed)
OT eval and self care treatment

## 2021-03-20 ENCOUNTER — Inpatient Hospital Stay (HOSPITAL_COMMUNITY): Payer: Medicare HMO

## 2021-03-20 DIAGNOSIS — I700 Atherosclerosis of aorta: Secondary | ICD-10-CM | POA: Diagnosis not present

## 2021-03-20 DIAGNOSIS — R911 Solitary pulmonary nodule: Secondary | ICD-10-CM | POA: Diagnosis not present

## 2021-03-20 MED ORDER — OXYCODONE HCL 20 MG PO TABS: 20.0000 mg | ORAL_TABLET | Freq: Four times a day (QID) | ORAL | 0 refills | Status: DC | PRN

## 2021-03-20 MED ORDER — METHOCARBAMOL 500 MG PO TABS: 500.0000 mg | ORAL_TABLET | Freq: Four times a day (QID) | ORAL | 0 refills | Status: DC | PRN

## 2021-03-20 NOTE — Plan of Care (Signed)
Potential discharge to home later today.   Problem: Safety: Goal: Ability to remain free from injury will improve Outcome: Progressing   Problem: Education: Goal: Ability to verbalize activity precautions or restrictions will improve Outcome: Progressing Goal: Knowledge of the prescribed therapeutic regimen will improve Outcome: Progressing Goal: Understanding of discharge needs will improve Outcome: Progressing   Problem: Activity: Goal: Ability to avoid complications of mobility impairment will improve Outcome: Progressing Goal: Ability to tolerate increased activity will improve Outcome: Progressing Goal: Will remain free from falls Outcome: Progressing   Problem: Bowel/Gastric: Goal: Gastrointestinal status for postoperative course will improve Outcome: Progressing   Problem: Clinical Measurements: Goal: Ability to maintain clinical measurements within normal limits will improve Outcome: Progressing Goal: Postoperative complications will be avoided or minimized Outcome: Progressing Goal: Diagnostic test results will improve Outcome: Progressing   Problem: Pain Management: Goal: Pain level will decrease Outcome: Progressing   Problem: Skin Integrity: Goal: Will show signs of wound healing Outcome: Progressing   Problem: Health Behavior/Discharge Planning: Goal: Identification of resources available to assist in meeting health care needs will improve Outcome: Progressing   Problem: Bladder/Genitourinary: Goal: Urinary functional status for postoperative course will improve Outcome: Progressing

## 2021-03-20 NOTE — Plan of Care (Addendum)
Patient discharged from the hospital this afternoon. Patient denies questions or concerns at this time.   Patient left unit with NT at 1530 hrs.   Problem: Safety: Goal: Ability to remain free from injury will improve 03/20/2021 1307 by Jesse Sans, RN Outcome: Adequate for Discharge 03/20/2021 1002 by Jesse Sans, RN Outcome: Progressing   Problem: Education: Goal: Ability to verbalize activity precautions or restrictions will improve 03/20/2021 1307 by Jesse Sans, RN Outcome: Adequate for Discharge 03/20/2021 1002 by Jesse Sans, RN Outcome: Progressing Goal: Knowledge of the prescribed therapeutic regimen will improve 03/20/2021 1307 by Jesse Sans, RN Outcome: Adequate for Discharge 03/20/2021 1002 by Jesse Sans, RN Outcome: Progressing Goal: Understanding of discharge needs will improve 03/20/2021 1307 by Jesse Sans, RN Outcome: Adequate for Discharge 03/20/2021 1002 by Jesse Sans, RN Outcome: Progressing   Problem: Activity: Goal: Ability to avoid complications of mobility impairment will improve 03/20/2021 1307 by Jesse Sans, RN Outcome: Adequate for Discharge 03/20/2021 1002 by Jesse Sans, RN Outcome: Progressing Goal: Ability to tolerate increased activity will improve 03/20/2021 1307 by Jesse Sans, RN Outcome: Adequate for Discharge 03/20/2021 1002 by Jesse Sans, RN Outcome: Progressing Goal: Will remain free from falls 03/20/2021 1307 by Jesse Sans, RN Outcome: Adequate for Discharge 03/20/2021 1002 by Jesse Sans, RN Outcome: Progressing   Problem: Bowel/Gastric: Goal: Gastrointestinal status for postoperative course will improve 03/20/2021 1307 by Jesse Sans, RN Outcome: Adequate for Discharge 03/20/2021 1002 by Jesse Sans, RN Outcome: Progressing   Problem: Pain Management: Goal: Pain level will decrease 03/20/2021 1307 by Jesse Sans, RN Outcome: Adequate for Discharge 03/20/2021 1002  by Jesse Sans, RN Outcome: Progressing   Problem: Clinical Measurements: Goal: Ability to maintain clinical measurements within normal limits will improve 03/20/2021 1307 by Jesse Sans, RN Outcome: Adequate for Discharge 03/20/2021 1002 by Jesse Sans, RN Outcome: Progressing Goal: Postoperative complications will be avoided or minimized 03/20/2021 1307 by Jesse Sans, RN Outcome: Adequate for Discharge 03/20/2021 1002 by Jesse Sans, RN Outcome: Progressing Goal: Diagnostic test results will improve 03/20/2021 1307 by Jesse Sans, RN Outcome: Adequate for Discharge 03/20/2021 1002 by Jesse Sans, RN Outcome: Progressing   Problem: Skin Integrity: Goal: Will show signs of wound healing 03/20/2021 1307 by Jesse Sans, RN Outcome: Adequate for Discharge 03/20/2021 1002 by Jesse Sans, RN Outcome: Progressing   Problem: Health Behavior/Discharge Planning: Goal: Identification of resources available to assist in meeting health care needs will improve 03/20/2021 1307 by Jesse Sans, RN Outcome: Adequate for Discharge 03/20/2021 1002 by Jesse Sans, RN Outcome: Progressing   Problem: Bladder/Genitourinary: Goal: Urinary functional status for postoperative course will improve 03/20/2021 1307 by Jesse Sans, RN Outcome: Adequate for Discharge 03/20/2021 1002 by Jesse Sans, RN Outcome: Progressing

## 2021-03-20 NOTE — Progress Notes (Signed)
Physical Therapy Treatment Patient Details Name: [name] MRN: [id number] DOB: 10-13-1965 Today's Date: 03/20/2021    History of Present Illness 55 y/o female who presents s/p L3-L5 PLIF on 03/18/2021. PMH significant for addison's disease, arthritis, CAD, cardiac arrest, CHF, HTN, stroke, vertigo, and lumbar spinal surgery 2020.    PT Comments    Pt progressing well with post-op mobility. She was able to demonstrate transfers and ambulation with gross supervision for safety progressing to modified independence by end of session. Gross gait speed continues to be very slow however pt overall steady once she's been up for a few minutes. Supervision provided initially for safety. Pt was educated on precautions, brace application/wearing schedule, appropriate activity progression, and car transfer. Will continue to follow.      Follow Up Recommendations  No PT follow up;Supervision for mobility/OOB     Equipment Recommendations  None recommended by PT    Recommendations for Other Services       Precautions / Restrictions Precautions Precautions: Back Precaution Booklet Issued: Yes (comment) Precaution Comments: All education reviewed, pt dremonstrating and verbalizing understanding. Required Braces or Orthoses: Spinal Brace Spinal Brace: Lumbar corset;Applied in sitting position Restrictions Weight Bearing Restrictions: No    Mobility  Bed Mobility Overal bed mobility: Modified Independent Bed Mobility: Rolling;Sidelying to Sit;Sit to Sidelying           General bed mobility comments: HOB flat and rails lowered to simulate home environment. Pt exited bed on the R side as she does at home.    Transfers Overall transfer level: Needs assistance Equipment used: None Transfers: Sit to/from Stand Sit to Stand: Supervision         General transfer comment: Supervision for safety.  Ambulation/Gait Ambulation/Gait assistance: Supervision;Modified independent  (Device/Increase time) Gait Distance (Feet): 450 Feet Assistive device: None Gait Pattern/deviations: Step-through pattern;Decreased stride length;Trunk flexed;Narrow base of support Gait velocity: Decreased Gait velocity interpretation: <1.31 ft/sec, indicative of household ambulator General Gait Details: Initially with mild unsteadiness noted progressing to mod I by end of session. Slow but steady throughout gait training.   Stairs             Wheelchair Mobility    Modified Rankin (Stroke Patients Only)       Balance Overall balance assessment: Mild deficits observed, not formally tested                                          Cognition Arousal/Alertness: Awake/alert Behavior During Therapy: WFL for tasks assessed/performed Overall Cognitive Status: Within Functional Limits for tasks assessed                                        Exercises      General Comments        Pertinent Vitals/Pain Pain Assessment: Faces Faces Pain Scale: Hurts little more Pain Location: back Pain Descriptors / Indicators: Operative site guarding;Constant;Discomfort Pain Intervention(s): Monitored during session;Limited activity within patient's tolerance;Repositioned    Home Living                      Prior Function            PT Goals (current goals can now be found in the care plan section) Acute Rehab PT Goals Patient Stated Goal:  no pain PT Goal Formulation: With patient Time For Goal Achievement: 03/26/21 Potential to Achieve Goals: Good Progress towards PT goals: Progressing toward goals    Frequency    Min 5X/week      PT Plan Current plan remains appropriate    Co-evaluation              AM-PAC PT "6 Clicks" Mobility   Outcome Measure  Help needed turning from your back to your side while in a flat bed without using bedrails?: None Help needed moving from lying on your back to sitting on the side of a  flat bed without using bedrails?: A Little Help needed moving to and from a bed to a chair (including a wheelchair)?: A Little Help needed standing up from a chair using your arms (e.g., wheelchair or bedside chair)?: A Little Help needed to walk in hospital room?: A Little Help needed climbing 3-5 steps with a railing? : A Little 6 Click Score: 19    End of Session Equipment Utilized During Treatment: Gait belt Activity Tolerance: Patient tolerated treatment well Patient left: in chair;with call bell/phone within reach;with family/visitor present Nurse Communication: Mobility status PT Visit Diagnosis: Unsteadiness on feet (R26.81);Pain Pain - part of body:  (back)     Time: 1042-1100 PT Time Calculation (min) (ACUTE ONLY): 18 min  Charges:  $Gait Training: 8-22 mins                     Rolinda Roan, PT, DPT Acute Rehabilitation Services Pager: [phone] Office: [phone]    Thelma Comp 03/20/2021, 11:32 AM

## 2021-03-20 NOTE — Discharge Summary (Signed)
Physician Discharge Summary  [name] MRN: [id number] DOB/AGE: Mar 26, 1966 55 y.o.  Admit date: 03/18/2021 Discharge date: 03/20/2021  Admission Diagnoses: Recurrent disc herniation L3-4 on the left status post previous L3-4 decompression, recurrent synovial cyst L4-5 on the left status post previous synovial cyst resection, segmental instability L4-5 with spondylolisthesis, back pain with radiculopathy     Discharge Diagnoses: same   Discharged Condition: good  Hospital Course: The patient was admitted on 03/18/2021 and taken to the operating room where the patient underwent PLIF L3-4, L4-5. The patient tolerated the procedure well and was taken to the recovery room and then to the floor in stable condition. The hospital course was routine. There were no complications. The wound remained clean dry and intact. Pt had appropriate back soreness. No complaints of leg pain or new N/T/W. The patient remained afebrile with stable vital signs, and tolerated a regular diet. The patient continued to increase activities, and pain was well controlled with oral pain medications.   Consults: None  Significant Diagnostic Studies:  Results for orders placed or performed during the hospital encounter of 03/18/21  Glucose, capillary  Result Value Ref Range   Glucose-Capillary 108 (H) 70 - 99 mg/dL  Glucose, capillary  Result Value Ref Range   Glucose-Capillary 112 (H) 70 - 99 mg/dL   Comment 1 Notify RN    Comment 2 Document in Chart   ABO/Rh  Result Value Ref Range   ABO/RH(D)      O POS Performed at [facility] [address] [place], [place] [address]     Chest 2 View  Result Date: 03/18/2021 CLINICAL DATA:  Preop. EXAM: CHEST - 2 VIEW COMPARISON:  01/26/2021 and CT chest 03/30/2017. FINDINGS: Trachea is midline. Heart size normal. There is a nodular added density in the left upper lobe, not seen on prior exams. Lungs are hyperinflated but otherwise clear. IMPRESSION:  1. Nodular added density in the left upper lobe, not seen on prior exams. CT chest without contrast is recommended in further evaluation, as clinically indicated. These results will be called to the ordering clinician or representative by the Radiologist Assistant, and communication documented in the PACS or [employer]. 2. Hyperinflation. Electronically Signed   By: Lorin Picket M.D.   On: 03/18/2021 07:12   DG Lumbar Spine 2-3 Views  Result Date: 03/18/2021 CLINICAL DATA:  Surgery, elective. Additional history provided: L3-4/L4-5 PLIF. Provided fluoroscopy time 1 minutes, 27 seconds (69.22 mGy). EXAM: LUMBAR SPINE - 2-3 VIEW; DG C-ARM 1-60 MIN COMPARISON:  Lumbar spine MRI 01/28/2021. FINDINGS: AP and lateral view intraoperative fluoroscopic images of the lumbar spine are submitted, 2 images total. The lowest well-formed intervertebral disc space is designated L5-S1. On the provided images, a posterior spinal fusion construct spans the L3-L5 levels (bilateral pedicle screws and vertical interconnecting rods). Interbody devices also present at L3-L4 and L4-L5. IMPRESSION: Two intraoperative fluoroscopic images of the lumbar spine from L3-L5 posterior fusion, as described. Electronically Signed   By: Kellie Simmering DO   On: 03/18/2021 12:33   DG C-Arm 1-60 Min  Result Date: 03/18/2021 CLINICAL DATA:  Surgery, elective. Additional history provided: L3-4/L4-5 PLIF. Provided fluoroscopy time 1 minutes, 27 seconds (69.22 mGy). EXAM: LUMBAR SPINE - 2-3 VIEW; DG C-ARM 1-60 MIN COMPARISON:  Lumbar spine MRI 01/28/2021. FINDINGS: AP and lateral view intraoperative fluoroscopic images of the lumbar spine are submitted, 2 images total. The lowest well-formed intervertebral disc space is designated L5-S1. On the provided images, a  posterior spinal fusion construct spans the L3-L5 levels (bilateral pedicle screws and vertical interconnecting rods). Interbody devices also present at L3-L4 and L4-L5. IMPRESSION: Two  intraoperative fluoroscopic images of the lumbar spine from L3-L5 posterior fusion, as described. Electronically Signed   By: Kellie Simmering DO   On: 03/18/2021 12:33    Antibiotics:  Anti-infectives (From admission, onward)    Start     Dose/Rate Route Frequency Ordered Stop   03/18/21 2200  vancomycin (VANCOCIN) IVPB 1000 mg/200 mL premix        1,000 mg 200 mL/hr over 60 Minutes Intravenous  Once 03/18/21 1526 03/18/21 2147   03/18/21 0745  vancomycin (VANCOCIN) IVPB 1000 mg/200 mL premix        1,000 mg 200 mL/hr over 60 Minutes Intravenous On call to O.R. 03/18/21 0741 03/18/21 1532       Discharge Exam: Blood pressure (!) 145/73, pulse 77, temperature 98 F (36.7 C), temperature source Oral, resp. rate 17, height 5\' 5"  (1.651 m), weight 81.2 kg, SpO2 99 %. Neurologic: Grossly normal Ambulating and voiding well, incision cdi   Discharge Medications:   Allergies as of 03/20/2021       Reactions   Bee Venom Anaphylaxis   Doxycycline Other (See Comments), Nausea Only   Due to Pre-Existing conditions involved with stomach, patient does not take the following medication   Erythromycin Other (See Comments), Nausea And Vomiting   Due to Pre-Existing conditions involved with stomach, patient does not take the following medication   Ibuprofen Nausea Only   gastroparesis   Penicillins Anaphylaxis, Swelling   Has patient had a PCN reaction causing immediate rash, facial/tongue/throat swelling, SOB or lightheadedness with hypotension: Yes Has patient had a PCN reaction causing severe rash involving mucus membranes or skin necrosis: Yes Has patient had a PCN reaction that required hospitalization No Has patient had a PCN reaction occurring within the last 10 years: No If all of the above answers are "NO", then may proceed with Cephalosporin use.   Sulfa Antibiotics Hives, Itching   Cat Hair Extract Hives, Itching, Swelling   SWELLING REACTION UNSPECIFIED    Dust Mite Extract Hives,  Swelling   Tramadol Diarrhea, Nausea And Vomiting   Morphine And Related Itching        Medication List     TAKE these medications    albuterol 108 (90 Base) MCG/ACT inhaler Commonly known as: VENTOLIN HFA Inhale 1-2 puffs into the lungs every 6 (six) hours as needed for wheezing or shortness of breath.   alendronate 70 MG tablet Commonly known as: FOSAMAX Take 70 mg by mouth every Monday.   ALPRAZolam 0.5 MG tablet Commonly known as: XANAX Take 0.5 mg by mouth 3 (three) times daily.   aspirin EC 81 MG tablet Take 81 mg by mouth daily. Swallow whole.   atorvastatin 40 MG tablet Commonly known as: LIPITOR Take 1 tablet (40 mg total) by mouth daily.   buPROPion 150 MG 12 hr tablet Commonly known as: WELLBUTRIN SR Take 150 mg by mouth 2 (two) times daily.   carvedilol 6.25 MG tablet Commonly known as: COREG Take 1 tablet (6.25 mg total) by mouth 2 (two) times daily with a meal.   cholecalciferol 1000 units tablet Commonly known as: VITAMIN D Take 1,000 Units by mouth in the morning.   EPINEPHrine 0.3 mg/0.3 mL Soaj injection Commonly known as: EPI-PEN Inject 0.3 mg into the muscle as needed for anaphylaxis.   gabapentin 600 MG tablet Commonly known as:  NEURONTIN Take 600 mg by mouth 4 (four) times daily as needed (pain).   hydrocortisone 10 MG tablet Commonly known as: CORTEF Take 10-20 mg by mouth See admin instructions. Takes 20 mg by mouth in the morning and 10 mg at night   levothyroxine 75 MCG tablet Commonly known as: SYNTHROID Take 75 mcg by mouth daily before breakfast.   meclizine 25 MG tablet Commonly known as: ANTIVERT 1 or 2 tabs PO q8h prn dizziness What changed:  how much to take how to take this when to take this reasons to take this additional instructions   methocarbamol 500 MG tablet Commonly known as: ROBAXIN Take 1 tablet (500 mg total) by mouth every 6 (six) hours as needed for muscle spasms.   nitroGLYCERIN 0.4 MG SL  tablet Commonly known as: NITROSTAT Place 1 tablet (0.4 mg total) under the tongue every 5 (five) minutes as needed for chest pain.   ondansetron 4 MG tablet Commonly known as: ZOFRAN Take 4 mg by mouth every 6 (six) hours as needed for nausea or vomiting.   Oxycodone HCl 20 MG Tabs Take 20 mg by mouth in the morning, at noon, in the evening, and at bedtime. What changed: Another medication with the same name was added. Make sure you understand how and when to take each.   Oxycodone HCl 20 MG Tabs Take 1 tablet (20 mg total) by mouth every 6 (six) hours as needed for severe pain ((score 7 to 10)). What changed: You were already taking a medication with the same name, and this prescription was added. Make sure you understand how and when to take each.   tiZANidine 4 MG tablet Commonly known as: ZANAFLEX Take 4 mg by mouth 4 (four) times daily as needed for muscle spasms.   topiramate 25 MG tablet Commonly known as: TOPAMAX Take 1 tablet (25 mg total) by mouth 2 (two) times daily.   zolpidem 10 MG tablet Commonly known as: AMBIEN Take 10 mg by mouth at bedtime.               Durable Medical Equipment  (From admission, onward)           Start     Ordered   03/18/21 1456  DME Walker rolling  Once       Question:  Patient needs a walker to treat with the following condition  Answer:  S/P lumbar fusion   03/18/21 1455   03/18/21 1456  DME 3 n 1  Once        03/18/21 1455            Disposition: home   Final Dx: PLIF L3-4, L4-5  Discharge Instructions      Remove dressing in 72 hours   Complete by: As directed    Call MD for:  difficulty breathing, headache or visual disturbances   Complete by: As directed    Call MD for:  hives   Complete by: As directed    Call MD for:  persistant nausea and vomiting   Complete by: As directed    Call MD for:  redness, tenderness, or signs of infection (pain, swelling, redness, odor or green/yellow discharge around  incision site)   Complete by: As directed    Call MD for:  severe uncontrolled pain   Complete by: As directed    Call MD for:  temperature >100.4   Complete by: As directed    Diet - low sodium heart healthy  Complete by: As directed    Driving Restrictions   Complete by: As directed    No driving for 2 weeks, no riding in the car for 1 week   Increase activity slowly   Complete by: As directed    Lifting restrictions   Complete by: As directed    No lifting more than 8 lbs          Signed: Ocie Cornfield Michaiah Holsopple 03/20/2021, 12:21 PM

## 2021-04-01 DIAGNOSIS — R69 Illness, unspecified: Secondary | ICD-10-CM | POA: Diagnosis not present

## 2021-04-01 DIAGNOSIS — Z4789 Encounter for other orthopedic aftercare: Secondary | ICD-10-CM | POA: Diagnosis not present

## 2021-04-01 DIAGNOSIS — M7138 Other bursal cyst, other site: Secondary | ICD-10-CM | POA: Diagnosis not present

## 2021-04-01 DIAGNOSIS — E669 Obesity, unspecified: Secondary | ICD-10-CM | POA: Diagnosis not present

## 2021-04-01 DIAGNOSIS — Z8673 Personal history of transient ischemic attack (TIA), and cerebral infarction without residual deficits: Secondary | ICD-10-CM | POA: Diagnosis not present

## 2021-04-01 DIAGNOSIS — Z6827 Body mass index (BMI) 27.0-27.9, adult: Secondary | ICD-10-CM | POA: Diagnosis not present

## 2021-04-01 DIAGNOSIS — I509 Heart failure, unspecified: Secondary | ICD-10-CM | POA: Diagnosis not present

## 2021-04-01 DIAGNOSIS — M5126 Other intervertebral disc displacement, lumbar region: Secondary | ICD-10-CM | POA: Diagnosis not present

## 2021-04-01 DIAGNOSIS — I169 Hypertensive crisis, unspecified: Secondary | ICD-10-CM | POA: Diagnosis not present

## 2021-04-01 DIAGNOSIS — I110 Hypertensive heart disease with heart failure: Secondary | ICD-10-CM | POA: Diagnosis not present

## 2021-04-01 DIAGNOSIS — Z981 Arthrodesis status: Secondary | ICD-10-CM | POA: Diagnosis not present

## 2021-04-01 DIAGNOSIS — Z7902 Long term (current) use of antithrombotics/antiplatelets: Secondary | ICD-10-CM | POA: Diagnosis not present

## 2021-04-01 DIAGNOSIS — M4316 Spondylolisthesis, lumbar region: Secondary | ICD-10-CM | POA: Diagnosis not present

## 2021-04-09 DIAGNOSIS — I509 Heart failure, unspecified: Secondary | ICD-10-CM | POA: Diagnosis not present

## 2021-04-09 DIAGNOSIS — I4891 Unspecified atrial fibrillation: Secondary | ICD-10-CM | POA: Diagnosis not present

## 2021-04-09 DIAGNOSIS — G40909 Epilepsy, unspecified, not intractable, without status epilepticus: Secondary | ICD-10-CM | POA: Diagnosis not present

## 2021-04-09 DIAGNOSIS — E785 Hyperlipidemia, unspecified: Secondary | ICD-10-CM | POA: Diagnosis not present

## 2021-04-09 DIAGNOSIS — G35 Multiple sclerosis: Secondary | ICD-10-CM | POA: Diagnosis not present

## 2021-04-09 DIAGNOSIS — E271 Primary adrenocortical insufficiency: Secondary | ICD-10-CM | POA: Diagnosis not present

## 2021-04-09 DIAGNOSIS — E663 Overweight: Secondary | ICD-10-CM | POA: Diagnosis not present

## 2021-04-09 DIAGNOSIS — E039 Hypothyroidism, unspecified: Secondary | ICD-10-CM | POA: Diagnosis not present

## 2021-04-09 DIAGNOSIS — I110 Hypertensive heart disease with heart failure: Secondary | ICD-10-CM | POA: Diagnosis not present

## 2021-04-09 DIAGNOSIS — R69 Illness, unspecified: Secondary | ICD-10-CM | POA: Diagnosis not present

## 2021-04-10 ENCOUNTER — Ambulatory Visit (HOSPITAL_BASED_OUTPATIENT_CLINIC_OR_DEPARTMENT_OTHER): Payer: Medicare HMO | Admitting: Cardiology

## 2021-04-10 ENCOUNTER — Encounter (HOSPITAL_BASED_OUTPATIENT_CLINIC_OR_DEPARTMENT_OTHER): Payer: Self-pay | Admitting: Cardiology

## 2021-04-10 ENCOUNTER — Other Ambulatory Visit: Payer: Self-pay

## 2021-04-10 VITALS — BP 134/80 | HR 79 | Ht 65.0 in | Wt 177.5 lb

## 2021-04-10 DIAGNOSIS — Z87891 Personal history of nicotine dependence: Secondary | ICD-10-CM

## 2021-04-10 DIAGNOSIS — I10 Essential (primary) hypertension: Secondary | ICD-10-CM | POA: Diagnosis not present

## 2021-04-10 DIAGNOSIS — I639 Cerebral infarction, unspecified: Secondary | ICD-10-CM

## 2021-04-10 DIAGNOSIS — I2510 Atherosclerotic heart disease of native coronary artery without angina pectoris: Secondary | ICD-10-CM

## 2021-04-10 DIAGNOSIS — Z7189 Other specified counseling: Secondary | ICD-10-CM | POA: Diagnosis not present

## 2021-04-10 MED ORDER — ATORVASTATIN CALCIUM 40 MG PO TABS
40.0000 mg | ORAL_TABLET | Freq: Every day | ORAL | 3 refills | Status: DC
Start: 2021-04-10 — End: 2022-04-20

## 2021-04-10 MED ORDER — CARVEDILOL 6.25 MG PO TABS: 6.2500 mg | ORAL_TABLET | Freq: Two times a day (BID) | ORAL | 3 refills | Status: DC

## 2021-04-10 NOTE — Patient Instructions (Signed)

## 2021-04-10 NOTE — Progress Notes (Signed)
Cardiology Office Note:    Date:  04/23/2021   ID:  [name], DOB [birth year], MRN [id number]  PCP:  Redmond School, MD  Cardiologist:  Buford Dresser, MD  Referring MD: Redmond School, MD   CC: new patient to me/transfer of care  History of Present Illness:    [name] is a 55 y.o. female with a hx of CVA, hypertension, hyperlipidemia, prior tobacco abuse, Addison's disease who is seen as a new patient to me today. She was previously seen by Dr. Debara Pickett.  Today: No concerns about her heart. Has been struggling since her stroke, has had falls and required spinal fusion. Has residual weakness in her right arm, has decreased grip strength, drops objects after holding for a while.   Is only a few weeks out from surgery, feels like the pain is similar to prior. Has follow up with her surgeon on the 23rd of this month. Once pain improves she will have physical therapy.   She follows with [facility] Merlene Laughter at [facility] neurolog[facility] post stroke. Back on aspirin (was off for about a month before her stroke). No hematochezia, but had black watery diarrhea last week, now normalized.   Denies chest pain, shortness of breath at rest or with normal exertion. No PND, orthopnea, LE edema or unexpected weight gain. No syncope or palpitations.   Remains tobacco abstinent.  Past Medical History:  Diagnosis Date   Addison's disease ([place])    Addison's disease ([place])    Adrenal insufficiency ([place])    diagnosed 2012   Aneurysm ([place])    Anxiety    Arthritis    Astigmatism    CAD (coronary artery disease)    Cath 2008 EF normal. RCA 50-60, Septal 50%. Myoview 3/12: EF 53% normal perfusion   Cardiac arrest ([place])    2/2 adissonian crisis   Cardiomyopathy    resolved   Chest pain    chronicc   CHF (congestive heart failure) (HCC)    Chronic back pain    Chronic diarrhea    Concussion    sept 28th 2014   Family history of breast cancer    Family history of colon cancer    Gastroparesis     GERD (gastroesophageal reflux disease)    Headache    migraines   HTN (hypertension)    Hyperlipidemia    Hypothyroidism    Mitral valve prolapse    Nondiabetic gastroparesis    PONV (postoperative nausea and vomiting)    Pre-diabetes    QT prolongation    Stroke [facility])    possible stroke 01/2021   Tobacco abuse    down to 2 cigarettes per day   Vertigo     Past Surgical History:  Procedure Laterality Date   ABDOMINAL HYSTERECTOMY     CARDIAC CATHETERIZATION  03/07/2007   showed 60% lesion in the right coronary artery   CHOLECYSTECTOMY     COLONOSCOPY     LEFT HEART CATHETERIZATION WITH CORONARY ANGIOGRAM N/A 04/13/2012   Procedure: LEFT HEART CATHETERIZATION WITH CORONARY ANGIOGRAM;  Surgeon: Hillary Bow, MD;  Location: [facility] CATH LAB;  Service: Cardiovascular;  Laterality: N/A;   LUMBAR LAMINECTOMY/DECOMPRESSION MICRODISCECTOMY Left 12/20/2018   Procedure: Left Lumbar Three-Four Lumbar Laminotomy and Foraminotomy, Lumbar Four-Five Laminotomy and Foraminotomy with Microdiscectomy and Resection of Synovial Cyst;  Surgeon: Jovita Gamma, MD;  Location: [facility];  Service: Neurosurgery;  Laterality: Left;  Left Lumbar 3-4 Lumbar laminotomy, foraminotomy, possible microdiscectomy with possible resection of synovial cyst  SPINE SURGERY     VARICOSE VEIN SURGERY     VESICOVAGINAL FISTULA CLOSURE W/ TAH      Current Medications: Current Outpatient Medications on File Prior to Visit  Medication Sig   albuterol (VENTOLIN HFA) 108 (90 Base) MCG/ACT inhaler Inhale 1-2 puffs into the lungs every 6 (six) hours as needed for wheezing or shortness of breath.    alendronate (FOSAMAX) 70 MG tablet Take 70 mg by mouth every Monday.   ALPRAZolam (XANAX) 0.5 MG tablet Take 0.5 mg by mouth 3 (three) times daily.   aspirin EC 81 MG tablet Take 81 mg by mouth daily. Swallow whole.   buPROPion (WELLBUTRIN SR) 150 MG 12 hr tablet Take 150 mg by mouth 2 (two) times daily.   cholecalciferol  (VITAMIN D) 1000 UNITS tablet Take 1,000 Units by mouth in the morning.   cyclobenzaprine (FLEXERIL) 10 MG tablet Take 10 mg by mouth 3 (three) times daily as needed.   EPINEPHrine 0.3 mg/0.3 mL IJ SOAJ injection Inject 0.3 mg into the muscle as needed for anaphylaxis.   gabapentin (NEURONTIN) 600 MG tablet Take 600 mg by mouth 4 (four) times daily as needed (pain).   hydrocortisone (CORTEF) 10 MG tablet Take 10-20 mg by mouth See admin instructions. Takes 20 mg by mouth in the morning and 10 mg at night   levothyroxine (SYNTHROID, LEVOTHROID) 75 MCG tablet Take 75 mcg by mouth daily before breakfast.   meclizine (ANTIVERT) 25 MG tablet 1 or 2 tabs PO q8h prn dizziness   nitroGLYCERIN (NITROSTAT) 0.4 MG SL tablet Place 1 tablet (0.4 mg total) under the tongue every 5 (five) minutes as needed for chest pain.   ondansetron (ZOFRAN) 4 MG tablet Take 4 mg by mouth every 6 (six) hours as needed for nausea or vomiting.   oxyCODONE 20 MG TABS Take 1 tablet (20 mg total) by mouth every 6 (six) hours as needed for severe pain ((score 7 to 10)).   zolpidem (AMBIEN) 10 MG tablet Take 10 mg by mouth at bedtime.   methocarbamol (ROBAXIN) 500 MG tablet Take 1 tablet (500 mg total) by mouth every 6 (six) hours as needed for muscle spasms. (Patient not taking: Reported on 04/10/2021)   Oxycodone HCl 20 MG TABS Take 20 mg by mouth in the morning, at noon, in the evening, and at bedtime. (Patient not taking: Reported on 04/10/2021)   tiZANidine (ZANAFLEX) 4 MG tablet Take 4 mg by mouth 4 (four) times daily as needed for muscle spasms. (Patient not taking: Reported on 04/10/2021)   topiramate (TOPAMAX) 25 MG tablet Take 1 tablet (25 mg total) by mouth 2 (two) times daily.   No current facility-administered medications on file prior to visit.     Allergies:   Bee venom, Doxycycline, Erythromycin, Ibuprofen, Penicillins, Sulfa antibiotics, Cat hair extract, Dust mite extract, Tramadol, and Morphine and related   Social  History   Tobacco Use   Smoking status: Former    Packs/day: 0.25    Years: 10.00    Pack years: 2.50    Types: Cigarettes    Quit date: 01/25/2021    Years since quitting: 0.2   Smokeless tobacco: Never   Tobacco comments:    smokes 2 a day  Vaping Use   Vaping Use: Former   Substances: Nicotine, CBD  Substance Use Topics   Alcohol use: No   Drug use: Yes    Types: Marijuana, Oxycodone    Comment: Hisrory of Cocaine use  Family History: family history includes Breast cancer (age of onset: 36) in her paternal uncle; Cancer in her father; Coronary artery disease in an other family member; Dementia in her maternal grandmother; Heart attack in her mother; Leukemia (age of onset: 28) in her maternal uncle.  ROS:   Please see the history of present illness.  Additional pertinent ROS: Constitutional: Negative for chills, fever, night sweats, unintentional weight loss  HENT: Negative for ear pain and hearing loss.   Eyes: Negative for loss of vision and eye pain.  Respiratory: Negative for cough, sputum, wheezing.   Cardiovascular: See HPI. Gastrointestinal: Negative for abdominal pain, melena, and hematochezia.  Genitourinary: Negative for dysuria and hematuria.  Musculoskeletal: Negative for falls and myalgias.  Skin: Negative for itching and rash.  Neurological: as per HPI Endo/Heme/Allergies: Does not bruise/bleed easily.     EKGs/Labs/Other Studies Reviewed:    The following studies were reviewed today: Echo 01/26/21  1. Left ventricular ejection fraction, by estimation, is 55 to 60%. The  left ventricle has normal function. The left ventricle has no regional  wall motion abnormalities. There is mild left ventricular hypertrophy.   2. Right ventricular systolic function is normal. The right ventricular  size is normal.   3. The mitral valve is normal in structure. No evidence of mitral valve  regurgitation.   4. Aortic valve regurgitation is not visualized. Mild  aortic valve  sclerosis is present, with no evidence of aortic valve stenosis.   5. The inferior vena cava is normal in size with greater than 50%  respiratory variability, suggesting right atrial pressure of 3 mmHg.   EKG:  EKG is personally reviewed.  The ekg ordered 03/16/21 demonstrates NSR at 62 bpm  Recent Labs: 01/25/2021: ALT 17 01/27/2021: TSH 0.348 03/16/2021: BUN 24; Creatinine, Ser 1.39; Hemoglobin 14.7; Platelets 391; Potassium 4.2; Sodium 138  Recent Lipid Panel    Component Value Date/Time   CHOL 137 01/26/2021 0410   TRIG 163 (H) 01/26/2021 0410   HDL 50 01/26/2021 0410   CHOLHDL 2.7 01/26/2021 0410   VLDL 33 01/26/2021 0410   LDLCALC 54 01/26/2021 0410    Physical Exam:    VS:  BP 134/80   Pulse 79   Ht '5\' 5"'$  (1.651 m)   Wt 177 lb 8 oz (80.5 kg)   SpO2 97%   BMI 29.54 kg/m     Wt Readings from Last 3 Encounters:  04/10/21 177 lb 8 oz (80.5 kg)  03/18/21 179 lb (81.2 kg)  03/16/21 179 lb 3.2 oz (81.3 kg)    GEN: Well nourished, well developed in no acute distress HEENT: Normal, moist mucous membranes NECK: No JVD CARDIAC: regular rhythm, normal S1 and S2, no rubs or gallops. No murmur. VASCULAR: Radial and DP pulses 2+ bilaterally. No carotid bruits RESPIRATORY:  Clear to auscultation without rales, wheezing or rhonchi  ABDOMEN: Soft, non-tender, non-distended MUSCULOSKELETAL:  Ambulates independently SKIN: Warm and dry, no edema NEUROLOGIC:  Alert and oriented x 3. No focal neuro deficits noted. PSYCHIATRIC:  Normal affect    ASSESSMENT:    1. Cerebrovascular accident (CVA), unspecified mechanism ([place])   2. Essential hypertension   3. Former smoker   4. Cardiac risk counseling   5. Counseling on health promotion and disease prevention    PLAN:    History of CVA Hypertension Hyperlipidemia Prior tobacco abuse -on atorvastatin 40 mg daily, aspirin 81 mg daily -blood pressure goal <130/80, near goal today. Continue carvedilol  Intermittent  LE edema:  improved, monitor  Cardiac risk counseling and prevention recommendations: -recommend heart healthy/Mediterranean diet, with whole grains, fruits, vegetable, fish, lean meats, nuts, and olive oil. Limit salt. -recommend moderate walking, 3-5 times/week for 30-50 minutes each session. Aim for at least 150 minutes.week. Goal should be pace of 3 miles/hours, or walking 1.5 miles in 30 minutes -recommend avoidance of tobacco products. Avoid excess alcohol. -ASCVD risk score: The ASCVD Risk score Mikey Bussing DC Jr., et al., 2013) failed to calculate for the following reasons:   The patient has a prior MI or stroke diagnosis    Plan for follow up: 1 year or sooner as needed  Buford Dresser, MD, PhD, [facility]    Medication Adjustments/Labs and Tests Ordered: Current medicines are reviewed at length with the patient today.  Concerns regarding medicines are outlined above.  No orders of the defined types were placed in this encounter.  Meds ordered this encounter  Medications   atorvastatin (LIPITOR) 40 MG tablet    Sig: Take 1 tablet (40 mg total) by mouth daily.    Dispense:  90 tablet    Refill:  3    Please wait to fill until patient calls for refills   carvedilol (COREG) 6.25 MG tablet    Sig: Take 1 tablet (6.25 mg total) by mouth 2 (two) times daily with a meal.    Dispense:  180 tablet    Refill:  3    Please wait to fill until patient calls for refills    Patient Instructions  Medication Instructions:  Your Physician recommend you continue on your current medication as directed.     *If you need a refill on your cardiac medications before your next appointment, please call your pharmacy*   Lab Work: None ordered today   Testing/Procedures: None ordered today   Follow-Up: At [facility], you and your health needs are our priority.  As part of our continuing mission to provide you with exceptional heart care, we have created  designated Provider Care Teams.  These Care Teams include your primary Cardiologist (physician) and Advanced Practice Providers (APPs -  Physician Assistants and Nurse Practitioners) who all work together to provide you with the care you need, when you need it.  We recommend signing up for the patient portal called "MyChart".  Sign up information is provided on this After Visit Summary.  MyChart is used to connect with patients for Virtual Visits (Telemedicine).  Patients are able to view lab/test results, encounter notes, upcoming appointments, etc.  Non-urgent messages can be sent to your provider as well.   To learn more about what you can do with MyChart, go to [email or web address].    Your next appointment:   1 year(s)  The format for your next appointment:   In Person  Provider:   Buford Dresser, MD     Signed, Buford Dresser, MD PhD 04/23/2021 2:08 PM    [place]

## 2021-04-15 DIAGNOSIS — M4316 Spondylolisthesis, lumbar region: Secondary | ICD-10-CM | POA: Diagnosis not present

## 2021-04-23 ENCOUNTER — Encounter (HOSPITAL_BASED_OUTPATIENT_CLINIC_OR_DEPARTMENT_OTHER): Payer: Self-pay | Admitting: Cardiology

## 2021-04-28 DIAGNOSIS — M4316 Spondylolisthesis, lumbar region: Secondary | ICD-10-CM | POA: Diagnosis not present

## 2021-05-05 DIAGNOSIS — E663 Overweight: Secondary | ICD-10-CM | POA: Diagnosis not present

## 2021-05-05 DIAGNOSIS — G894 Chronic pain syndrome: Secondary | ICD-10-CM | POA: Diagnosis not present

## 2021-05-05 DIAGNOSIS — Z6827 Body mass index (BMI) 27.0-27.9, adult: Secondary | ICD-10-CM | POA: Diagnosis not present

## 2021-05-05 DIAGNOSIS — R69 Illness, unspecified: Secondary | ICD-10-CM | POA: Diagnosis not present

## 2021-05-05 DIAGNOSIS — I10 Essential (primary) hypertension: Secondary | ICD-10-CM | POA: Diagnosis not present

## 2021-05-19 DIAGNOSIS — Z0100 Encounter for examination of eyes and vision without abnormal findings: Secondary | ICD-10-CM | POA: Diagnosis not present

## 2021-05-19 DIAGNOSIS — H5200 Hypermetropia, unspecified eye: Secondary | ICD-10-CM | POA: Diagnosis not present

## 2021-05-19 DIAGNOSIS — E7800 Pure hypercholesterolemia, unspecified: Secondary | ICD-10-CM | POA: Diagnosis not present

## 2021-05-23 IMAGING — XA Imaging study
2 series · 2 of 2 positions shown · non-contrast
Comparison: none

CLINICAL DATA: Low back pain, bilateral hip and buttock pain.
Caudal injection requested. Spondylosis without myelopathy.

[Series 1: ortho adipose · 1 of 1 slices shown (1 of 2)]
[im 1/1]
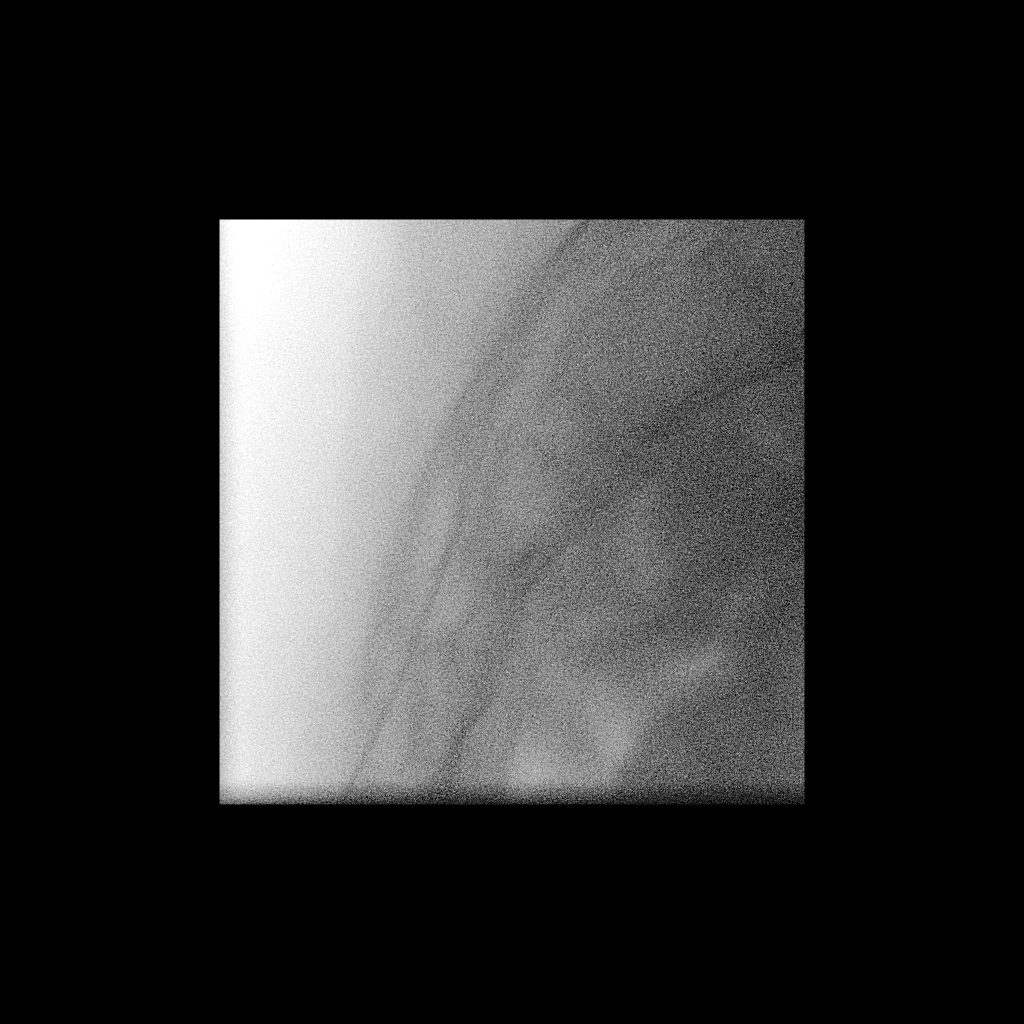

[Series 2: ortho adipose · 1 of 1 slices shown (2 of 2)]
[im 1/1]
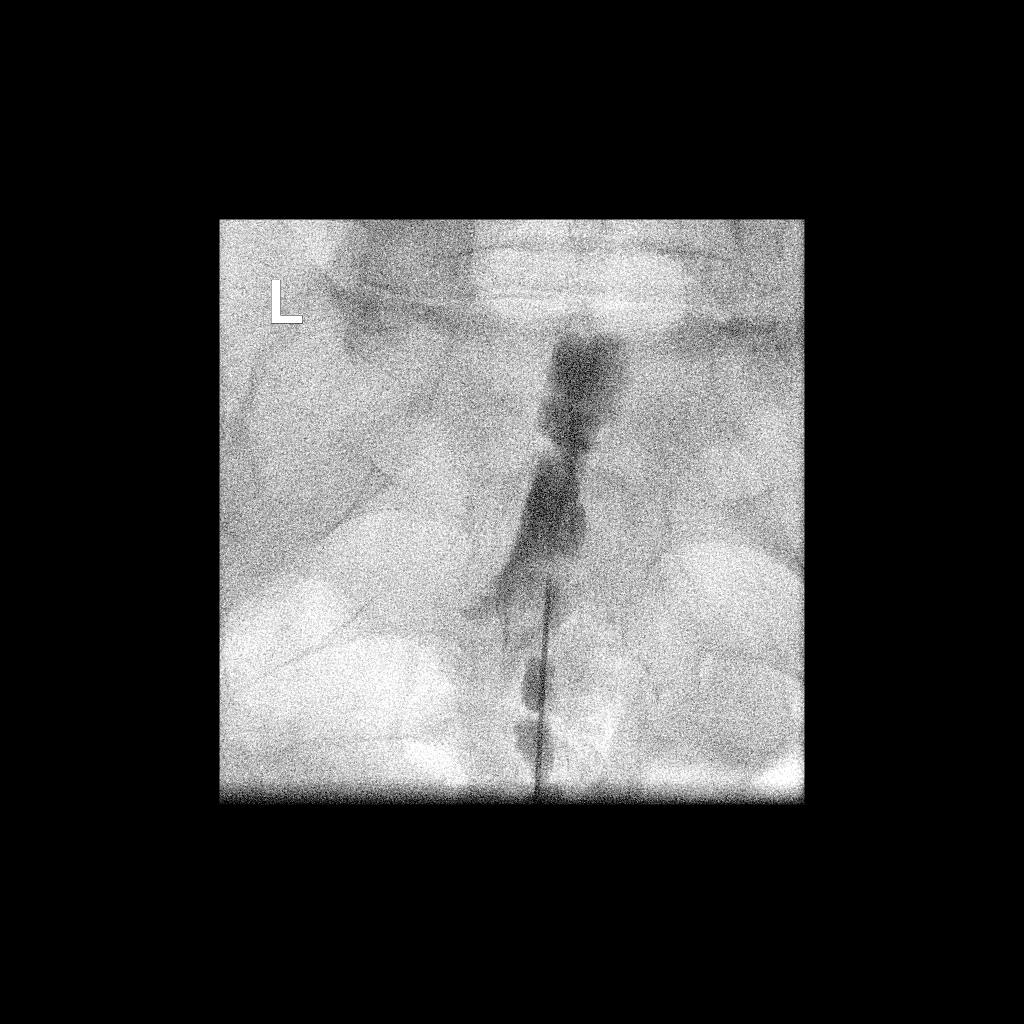

[2 of 2 positions shown; findings below may reference images not displayed]

FLUOROSCOPY TIME:  0 minutes 12 seconds. 18.05 micro gray meter
squared

EXAM:
CAUDAL EPIDURAL INJECTION

Utilizing a caudal approach, the skin overlying the sacral hiatus
was cleansed and anesthetized. A 20 gauge epidural needle was
advanced into the sacral epidural space. Injection of Isovue-M 200
shows a good epidural pattern with spread up to L5-S1. No vascular
opacification is seen.

120 mg of Depo-Medrol mixed with 3 ml of normal saline and 3 ml of
1% Lidocaine were instilled. The procedure was well-tolerated, and
the patient was discharged thirty minutes following the injection in
good condition.
IMPRESSION: Technically successful caudal epidural injection #1.

## 2021-06-01 DIAGNOSIS — J329 Chronic sinusitis, unspecified: Secondary | ICD-10-CM | POA: Diagnosis not present

## 2021-06-01 DIAGNOSIS — G894 Chronic pain syndrome: Secondary | ICD-10-CM | POA: Diagnosis not present

## 2021-06-01 DIAGNOSIS — E063 Autoimmune thyroiditis: Secondary | ICD-10-CM | POA: Diagnosis not present

## 2021-06-01 DIAGNOSIS — M545 Low back pain, unspecified: Secondary | ICD-10-CM | POA: Diagnosis not present

## 2021-06-02 ENCOUNTER — Other Ambulatory Visit (HOSPITAL_COMMUNITY): Payer: Self-pay | Admitting: Internal Medicine

## 2021-06-02 DIAGNOSIS — M545 Low back pain, unspecified: Secondary | ICD-10-CM

## 2021-06-05 ENCOUNTER — Ambulatory Visit (HOSPITAL_COMMUNITY)
Admission: RE | Admit: 2021-06-05 | Discharge: 2021-06-05 | Disposition: A | Discharge status: Home / Self Care | Payer: Medicare HMO | Source: Ambulatory Visit | Attending: Internal Medicine | Admitting: Internal Medicine

## 2021-06-05 ENCOUNTER — Other Ambulatory Visit: Payer: Self-pay

## 2021-06-05 DIAGNOSIS — M545 Low back pain, unspecified: Secondary | ICD-10-CM | POA: Diagnosis not present

## 2021-06-07 IMAGING — XA Imaging study
2 series · 2 of 2 positions shown · non-contrast
Comparison: none

CLINICAL DATA: Radiculopathy, lumbar region. Displacement of the
lumbar discs at L4-5 and L5-S1.

[Series 1: ortho adipose · 1 of 1 slices shown (1 of 2)]
[im 1/1]
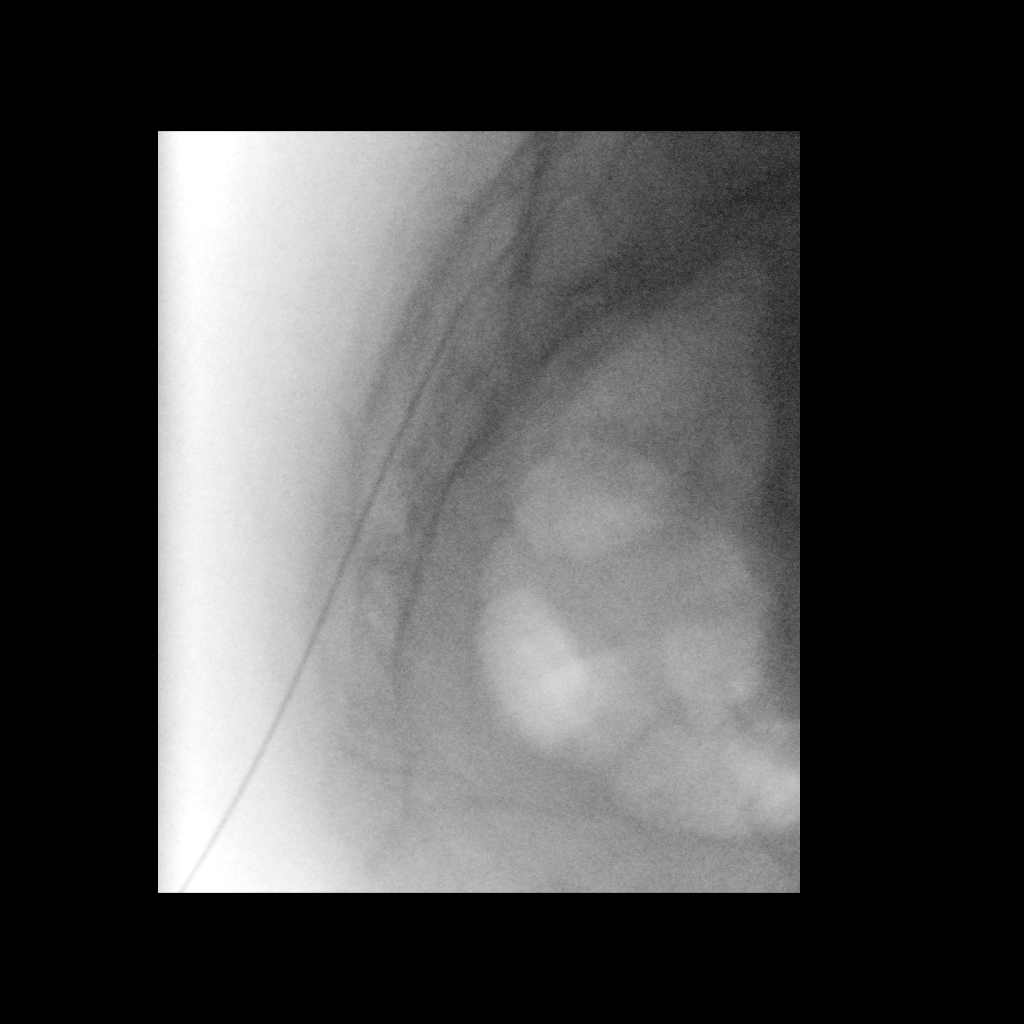

[Series 2: ortho adipose · 1 of 1 slices shown (2 of 2)]
[im 1/1]
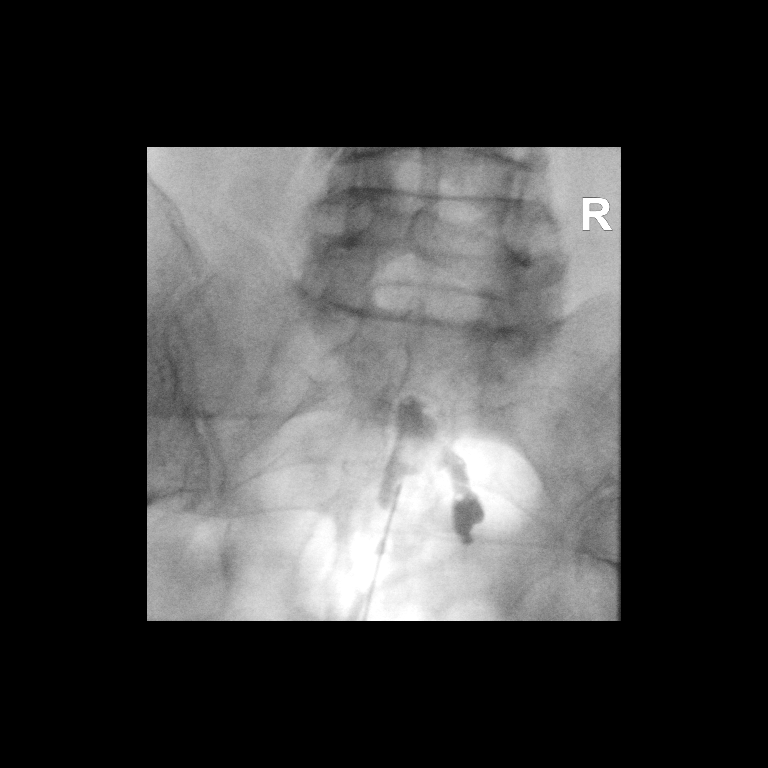

[2 of 2 positions shown; findings below may reference images not displayed]

FLUOROSCOPY TIME:  Radiation Exposure Index (as provided by the
fluoroscopic device): 57.89 uGy*m2

EXAM:
CAUDAL EPIDURAL INJECTION

Utilizing a caudal approach, the skin overlying the sacral hiatus
was cleansed and anesthetized. A 20 gauge epidural needle was
advanced into the sacral epidural space. Injection of Isovue-M 200
shows a good epidural pattern with spread up to L5-S1. No vascular
opacification is seen.

120 mg of Depo-Medrol mixed with 3 ml of normal saline and 3 ml of
1% Lidocaine were instilled. The procedure was well-tolerated, and
the patient was discharged thirty minutes following the injection in
good condition.
IMPRESSION: Technically successful caudal epidural injection #2.

## 2021-06-15 DIAGNOSIS — M545 Low back pain, unspecified: Secondary | ICD-10-CM | POA: Diagnosis not present

## 2021-06-15 DIAGNOSIS — I6930 Unspecified sequelae of cerebral infarction: Secondary | ICD-10-CM | POA: Diagnosis not present

## 2021-06-15 DIAGNOSIS — M542 Cervicalgia: Secondary | ICD-10-CM | POA: Diagnosis not present

## 2021-06-15 DIAGNOSIS — R251 Tremor, unspecified: Secondary | ICD-10-CM | POA: Diagnosis not present

## 2021-06-15 DIAGNOSIS — M5416 Radiculopathy, lumbar region: Secondary | ICD-10-CM | POA: Diagnosis not present

## 2021-06-15 DIAGNOSIS — G40009 Localization-related (focal) (partial) idiopathic epilepsy and epileptic syndromes with seizures of localized onset, not intractable, without status epilepticus: Secondary | ICD-10-CM | POA: Diagnosis not present

## 2021-06-15 DIAGNOSIS — Z79891 Long term (current) use of opiate analgesic: Secondary | ICD-10-CM | POA: Diagnosis not present

## 2021-06-15 DIAGNOSIS — G5601 Carpal tunnel syndrome, right upper limb: Secondary | ICD-10-CM | POA: Diagnosis not present

## 2021-06-15 DIAGNOSIS — R252 Cramp and spasm: Secondary | ICD-10-CM | POA: Diagnosis not present

## 2021-06-24 ENCOUNTER — Other Ambulatory Visit (HOSPITAL_COMMUNITY): Payer: Self-pay | Admitting: Internal Medicine

## 2021-06-24 DIAGNOSIS — S6992XA Unspecified injury of left wrist, hand and finger(s), initial encounter: Secondary | ICD-10-CM

## 2021-06-24 DIAGNOSIS — R059 Cough, unspecified: Secondary | ICD-10-CM | POA: Diagnosis not present

## 2021-06-24 DIAGNOSIS — G894 Chronic pain syndrome: Secondary | ICD-10-CM | POA: Diagnosis not present

## 2021-06-24 DIAGNOSIS — S8991XA Unspecified injury of right lower leg, initial encounter: Secondary | ICD-10-CM

## 2021-06-24 DIAGNOSIS — M545 Low back pain, unspecified: Secondary | ICD-10-CM | POA: Diagnosis not present

## 2021-06-24 DIAGNOSIS — I10 Essential (primary) hypertension: Secondary | ICD-10-CM | POA: Diagnosis not present

## 2021-06-26 ENCOUNTER — Ambulatory Visit (HOSPITAL_COMMUNITY)
Admission: RE | Admit: 2021-06-26 | Discharge: 2021-06-26 | Disposition: A | Discharge status: Home / Self Care | Payer: Medicare HMO | Source: Ambulatory Visit | Attending: Internal Medicine | Admitting: Internal Medicine

## 2021-06-26 ENCOUNTER — Other Ambulatory Visit: Payer: Self-pay

## 2021-06-26 ENCOUNTER — Other Ambulatory Visit (HOSPITAL_COMMUNITY): Payer: Self-pay | Admitting: Internal Medicine

## 2021-06-26 DIAGNOSIS — S6992XA Unspecified injury of left wrist, hand and finger(s), initial encounter: Secondary | ICD-10-CM | POA: Diagnosis not present

## 2021-06-26 DIAGNOSIS — R059 Cough, unspecified: Secondary | ICD-10-CM | POA: Insufficient documentation

## 2021-06-26 DIAGNOSIS — S62307A Unspecified fracture of fifth metacarpal bone, left hand, initial encounter for closed fracture: Secondary | ICD-10-CM | POA: Diagnosis not present

## 2021-06-26 DIAGNOSIS — S8991XA Unspecified injury of right lower leg, initial encounter: Secondary | ICD-10-CM | POA: Insufficient documentation

## 2021-07-01 IMAGING — MR MR LUMBAR SPINE W/O CM
4 of 5 series · 29 of 48 positions shown · non-contrast
Comparison: Radiographs of the lumbar spine 07/06/2020. Lumbar
spine MRI 11/14/2018.

CLINICAL DATA: Low back pain, unspecified back pain laterality,
unspecified chronicity, unspecified whether sciatica present.
Additional history provided by scanning technologist: Patient
reports low back pain radiating down both legs for 2 months.

EXAM:
MRI LUMBAR SPINE WITHOUT CONTRAST
TECHNIQUE: Multiplanar, multisequence MR imaging of the lumbar spine was
performed. No intravenous contrast was administered.

[Series 5: T2 · sagittal · 4.0mm · 0.68mm/px · 6 of 16 slices shown (1 of 2)]
[im 1/16]
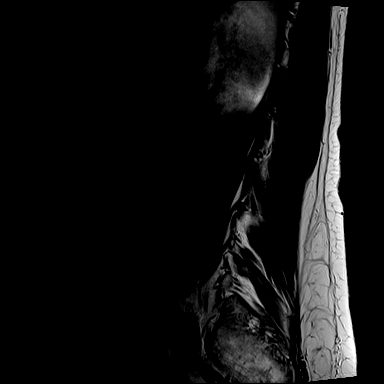
[im 4/16]
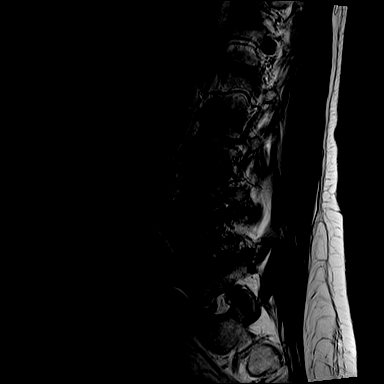
[im 7/16]
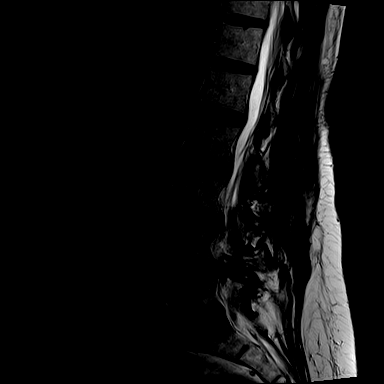
[im 10/16]
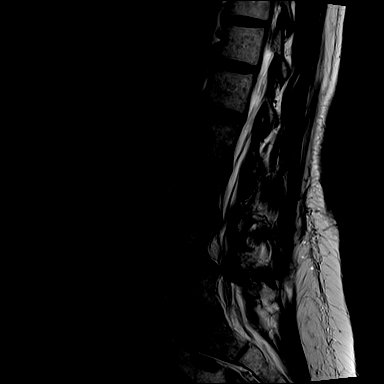
[im 13/16]
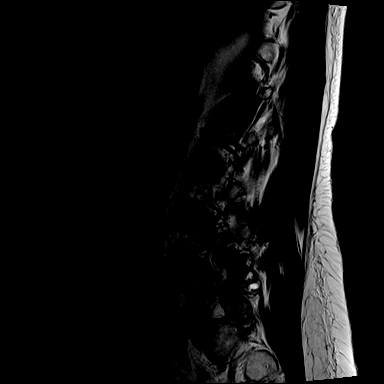
[im 16/16]
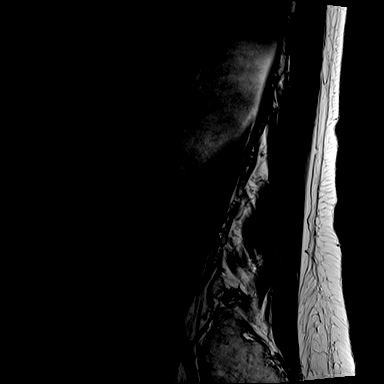

[Series 6: T1 · sagittal · 4.0mm · 0.81mm/px · 7 of 15 slices shown (1 of 2)]
[im 1/15]
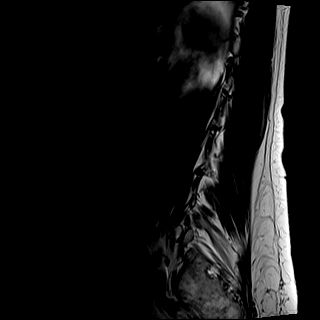
[im 3/15]
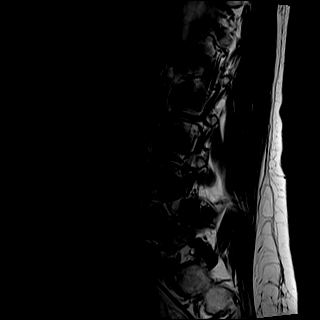
[im 5/15]
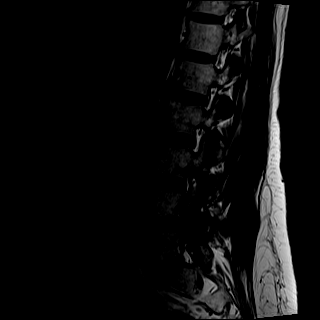
[im 8/15]
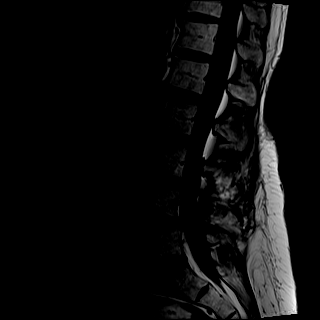
[im 10/15]
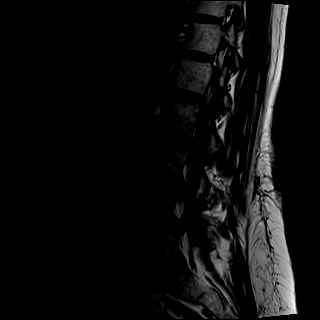
[im 12/15]
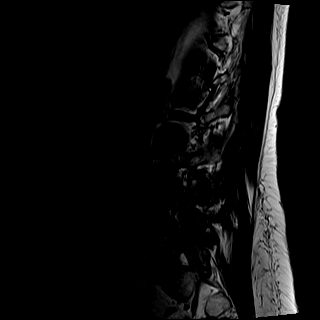
[im 15/15]
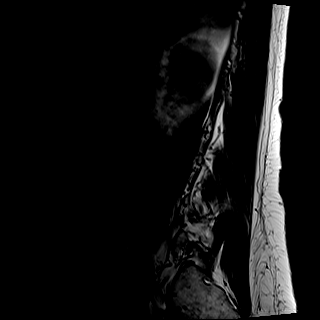

[Series 8: T2 · axial · 4.0mm · 0.70mm/px · z∈[-124,+59]mm · 8 of 31 slices shown (2 of 2)]
[im 1/31]
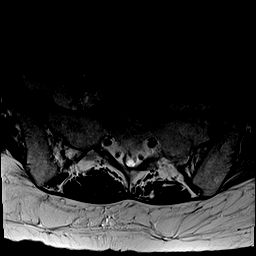
[im 5/31]
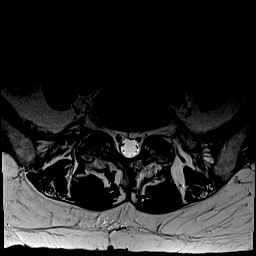
[im 10/31]
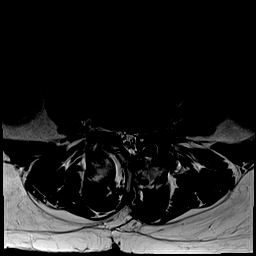
[im 14/31]
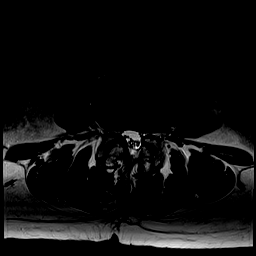
[im 17/31]
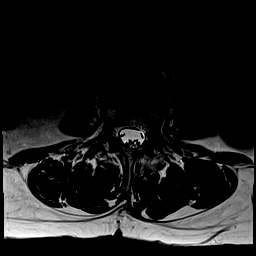
[im 21/31]
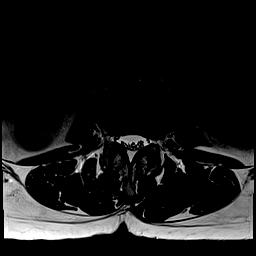
[im 26/31]
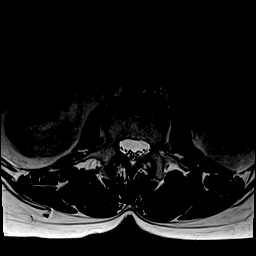
[im 31/31]
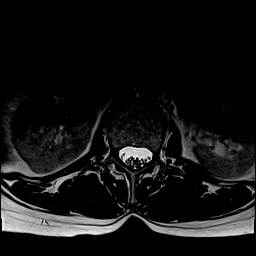

[Series 9: T1 · axial · 4.0mm · 0.35mm/px · z∈[-124,+59]mm · 8 of 31 slices shown (2 of 2)]
[im 1/31]
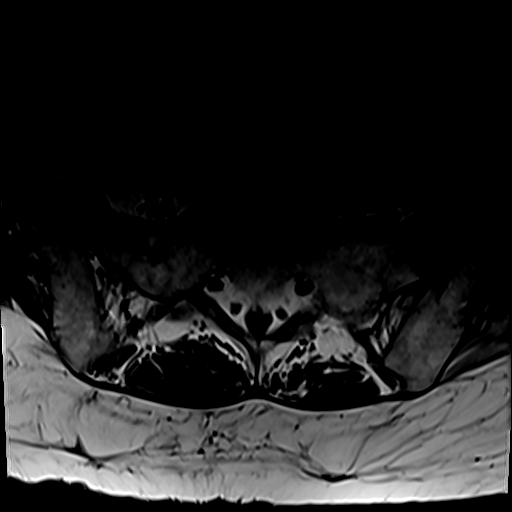
[im 5/31]
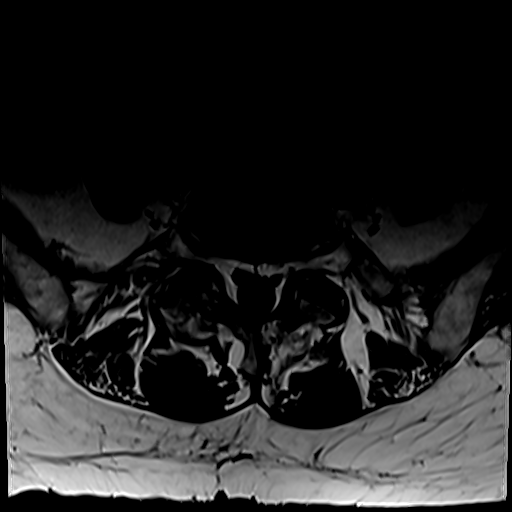
[im 10/31]
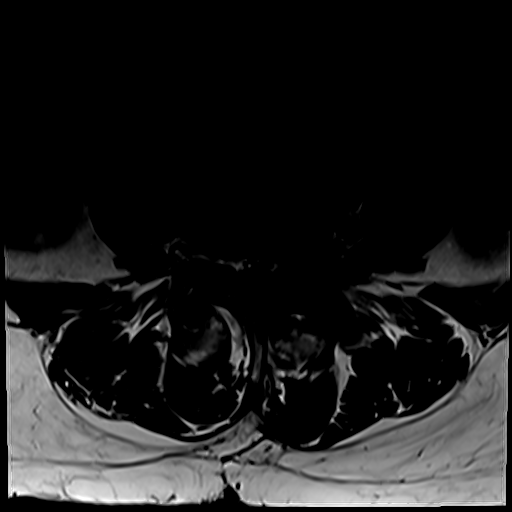
[im 14/31]
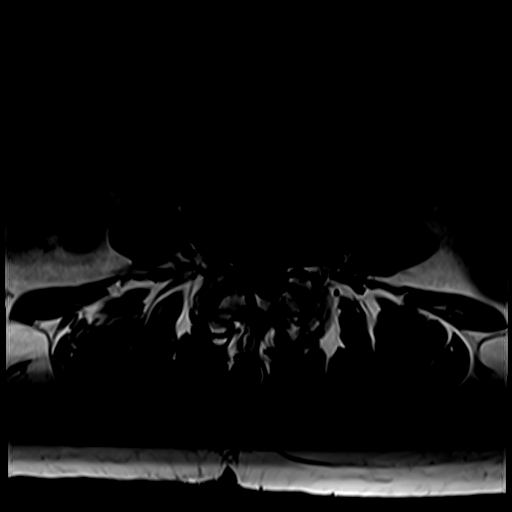
[im 17/31]
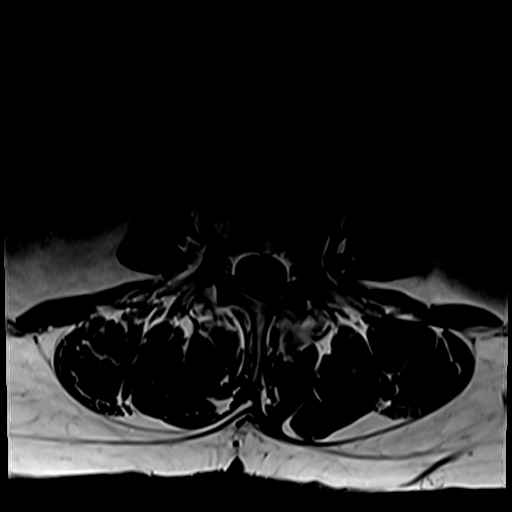
[im 21/31]
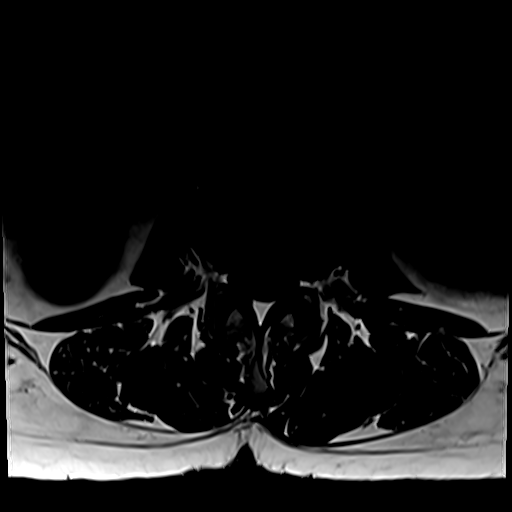
[im 26/31]
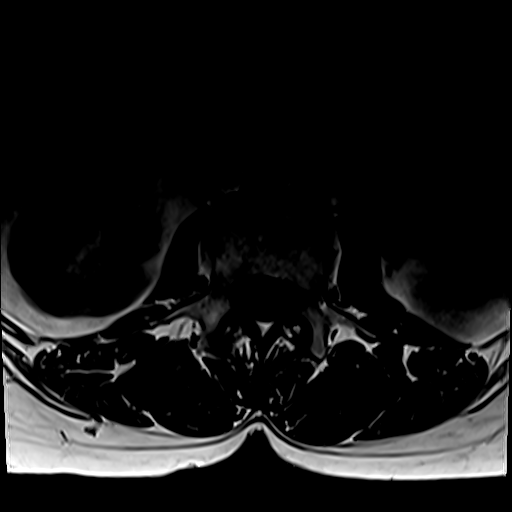
[im 31/31]
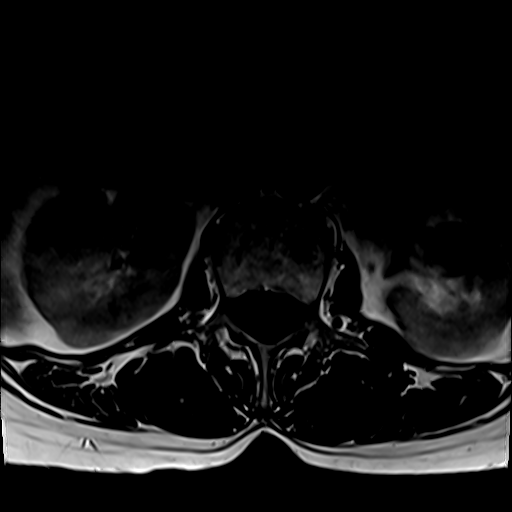

[29 of 48 positions shown; findings below may reference images not displayed]

FINDINGS: Segmentation: 5 lumbar vertebrae. The lowest well-formed
intervertebral disc space is designated L5-S1.

Alignment: Subtle lumbar levocurvature. 3 mm L4-L5 grade 1
anterolisthesis, progressed from the prior MRI. No significant
spondylolisthesis at the remaining levels.

Vertebrae: Vertebral body height is maintained. Edema is present
within the bilateral L3, L4 and L5 pedicles/articular pillars.

Conus medullaris and cauda equina: Conus extends to the L1-L2 level.
No signal abnormality within the visualized distal spinal cord.

Paraspinal and other soft tissues: No abnormality identified within
included portions of the abdomen/retroperitoneum. Postsurgical
changes to the dorsal lumbar soft tissues at L3-L4 and L4-L5.

Disc levels:

Unless otherwise stated, the level by level findings below have not
significantly changed since prior MRI 11/14/2018.

Progressive mild-to-moderate disc degeneration at L4-L5. No more
than mild disc degeneration at the remaining levels.

T11-T12: Imaged sagittally. No significant disc herniation or
stenosis.

T12-L1: Imaged sagittally. No significant disc herniation or
stenosis.

L1-L2: Minimal disc bulge. Mild facet arthrosis. No significant
spinal canal or foraminal stenosis.

L2-L3: Minimal disc bulge. Mild facet arthrosis. No significant
spinal canal or foraminal stenosis.

L3-L4: Interval left laminectomy and discectomy. Disc bulge.
Although slightly regressed, there is a persistent shallow left
subarticular to left foraminal disc protrusion. Moderate facet
arthrosis. Right-sided ligamentum flavum hypertrophy. Mild residual
left subarticular narrowing, significantly improved. Unchanged mild
right subarticular narrowing. Central canal patent. Unchanged
moderate left neural foraminal narrowing.

L4-L5: Interval left laminectomy. Disc bulge. Redemonstrated central
posterior annular fissure. New from the prior exam, there is a
broad-based left center to left foraminal disc protrusion (series 8,
image 23). Facet arthrosis (moderate right, moderate/severe left).
Trace bilateral facet joint effusions. Hypertrophied ligamentum
flavum on the right. Persistent severe bilateral subarticular
stenosis with encroachment upon the bilateral descending L5 nerve
roots (series 8, image 23). Mild relative narrowing of the central
canal. Bilateral neural foraminal narrowing (mild right, moderate
left).

L5-S1: Mild facet arthrosis. No significant disc herniation or
stenosis.
IMPRESSION: Comparison is made to the prior lumbar spine MRI of 11/14/2018.

Lumbar spondylosis and postoperative changes, as outlined and with
findings most notably as follows.

Edema within the bilateral L3, L4 and L5 pedicles/articular pillars.
Findings are likely degenerative in the absence of any signs or
symptoms of infection. Clinical correlation is recommended.

At L4-L5, there has been interval left laminectomy. There is now 3
mm grade 1 anterolisthesis. Progressive mild/moderate disc
degeneration. Persistent disc bulge. New superimposed broad-based
left center to left foraminal disc protrusion. Moderate to severe
facet arthrosis with trace bilateral facet joint effusions.
Hypertrophied ligamentum flavum on the right. Persistent severe
bilateral subarticular stenosis with encroachment upon the bilateral
descending L5 nerve roots. Unchanged bilateral neural foraminal
narrowing (mild right, moderate left).

At L3-L4, there has been interval left laminectomy and discectomy.
Persistent disc bulge. Although slightly regressed, there is a
persistent superimposed shallow left subarticular to left foraminal
disc protrusion. Moderate facet arthrosis. Right-sided ligamentum
flavum hypertrophy. Mild residual left subarticular narrowing,
significantly improved. Unchanged mild right subarticular narrowing.
Unchanged moderate left neural foraminal narrowing.

## 2021-07-02 DIAGNOSIS — G603 Idiopathic progressive neuropathy: Secondary | ICD-10-CM | POA: Diagnosis not present

## 2021-07-02 DIAGNOSIS — R69 Illness, unspecified: Secondary | ICD-10-CM | POA: Diagnosis not present

## 2021-07-02 DIAGNOSIS — G5621 Lesion of ulnar nerve, right upper limb: Secondary | ICD-10-CM | POA: Diagnosis not present

## 2021-07-03 ENCOUNTER — Other Ambulatory Visit: Payer: Self-pay | Admitting: Neurology

## 2021-07-03 ENCOUNTER — Other Ambulatory Visit (HOSPITAL_COMMUNITY): Payer: Self-pay | Admitting: Neurology

## 2021-07-03 DIAGNOSIS — F17200 Nicotine dependence, unspecified, uncomplicated: Secondary | ICD-10-CM

## 2021-07-03 DIAGNOSIS — F17209 Nicotine dependence, unspecified, with unspecified nicotine-induced disorders: Secondary | ICD-10-CM

## 2021-07-06 DIAGNOSIS — G603 Idiopathic progressive neuropathy: Secondary | ICD-10-CM | POA: Diagnosis not present

## 2021-07-13 DIAGNOSIS — J029 Acute pharyngitis, unspecified: Secondary | ICD-10-CM | POA: Diagnosis not present

## 2021-07-13 DIAGNOSIS — B370 Candidal stomatitis: Secondary | ICD-10-CM | POA: Diagnosis not present

## 2021-07-15 DIAGNOSIS — E273 Drug-induced adrenocortical insufficiency: Secondary | ICD-10-CM | POA: Diagnosis not present

## 2021-07-15 DIAGNOSIS — E039 Hypothyroidism, unspecified: Secondary | ICD-10-CM | POA: Diagnosis not present

## 2021-07-15 DIAGNOSIS — R739 Hyperglycemia, unspecified: Secondary | ICD-10-CM | POA: Diagnosis not present

## 2021-07-15 DIAGNOSIS — M8580 Other specified disorders of bone density and structure, unspecified site: Secondary | ICD-10-CM | POA: Diagnosis not present

## 2021-07-15 DIAGNOSIS — I10 Essential (primary) hypertension: Secondary | ICD-10-CM | POA: Diagnosis not present

## 2021-07-19 IMAGING — DX DG HIP (WITH OR WITHOUT PELVIS) 2-3V*R*
3 series · 3 of 3 positions shown · non-contrast
Comparison: None.

CLINICAL DATA: Recent fall, persistent right hip pain

EXAM:
DG HIP (WITH OR WITHOUT PELVIS) 2-3V RIGHT

[pelvis ap]
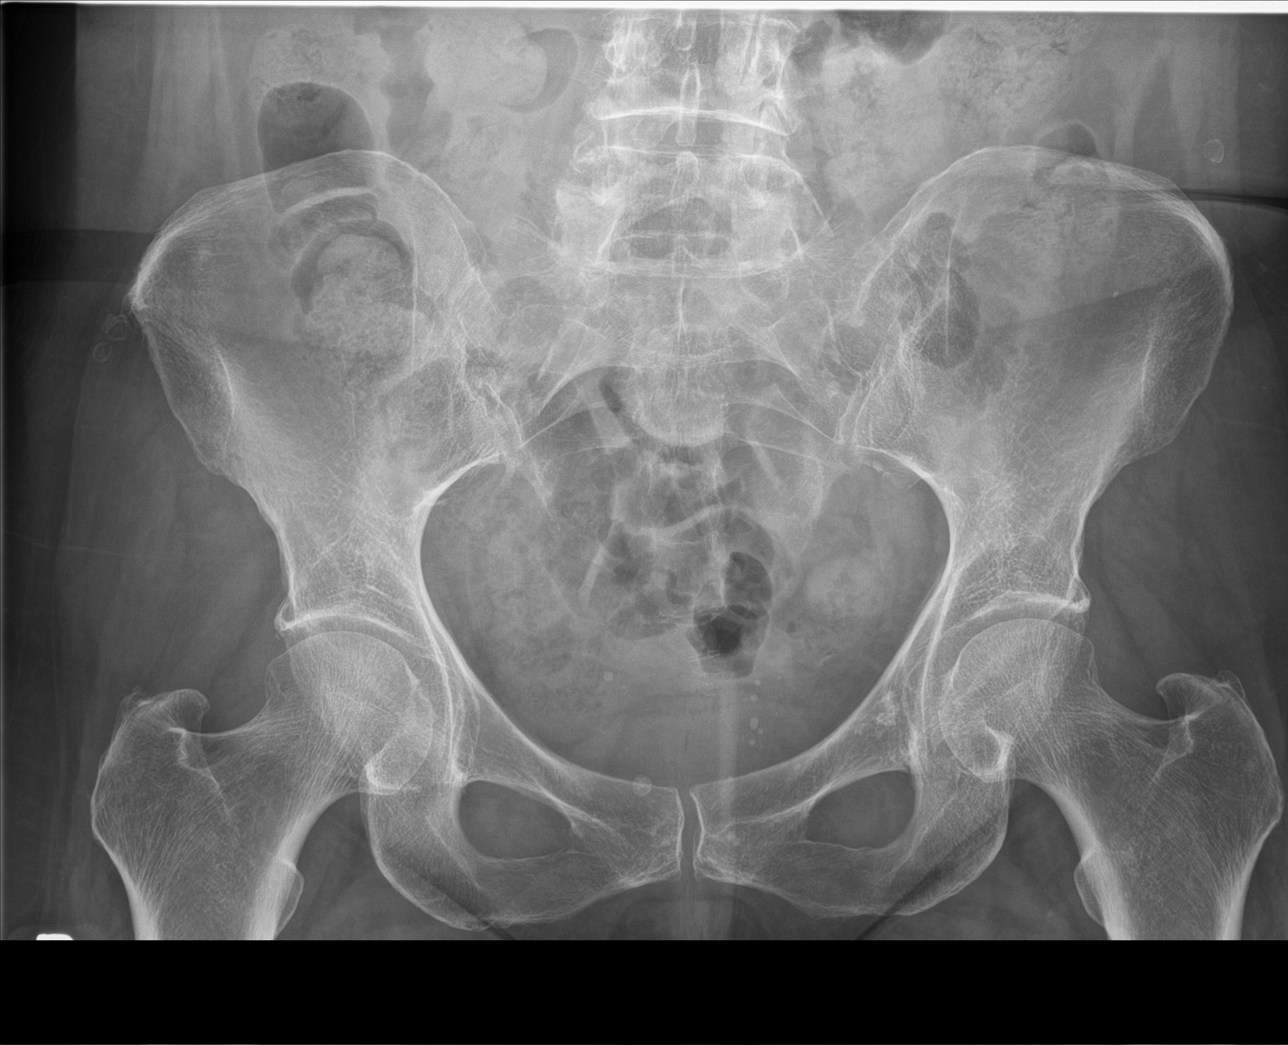

[hip ap]
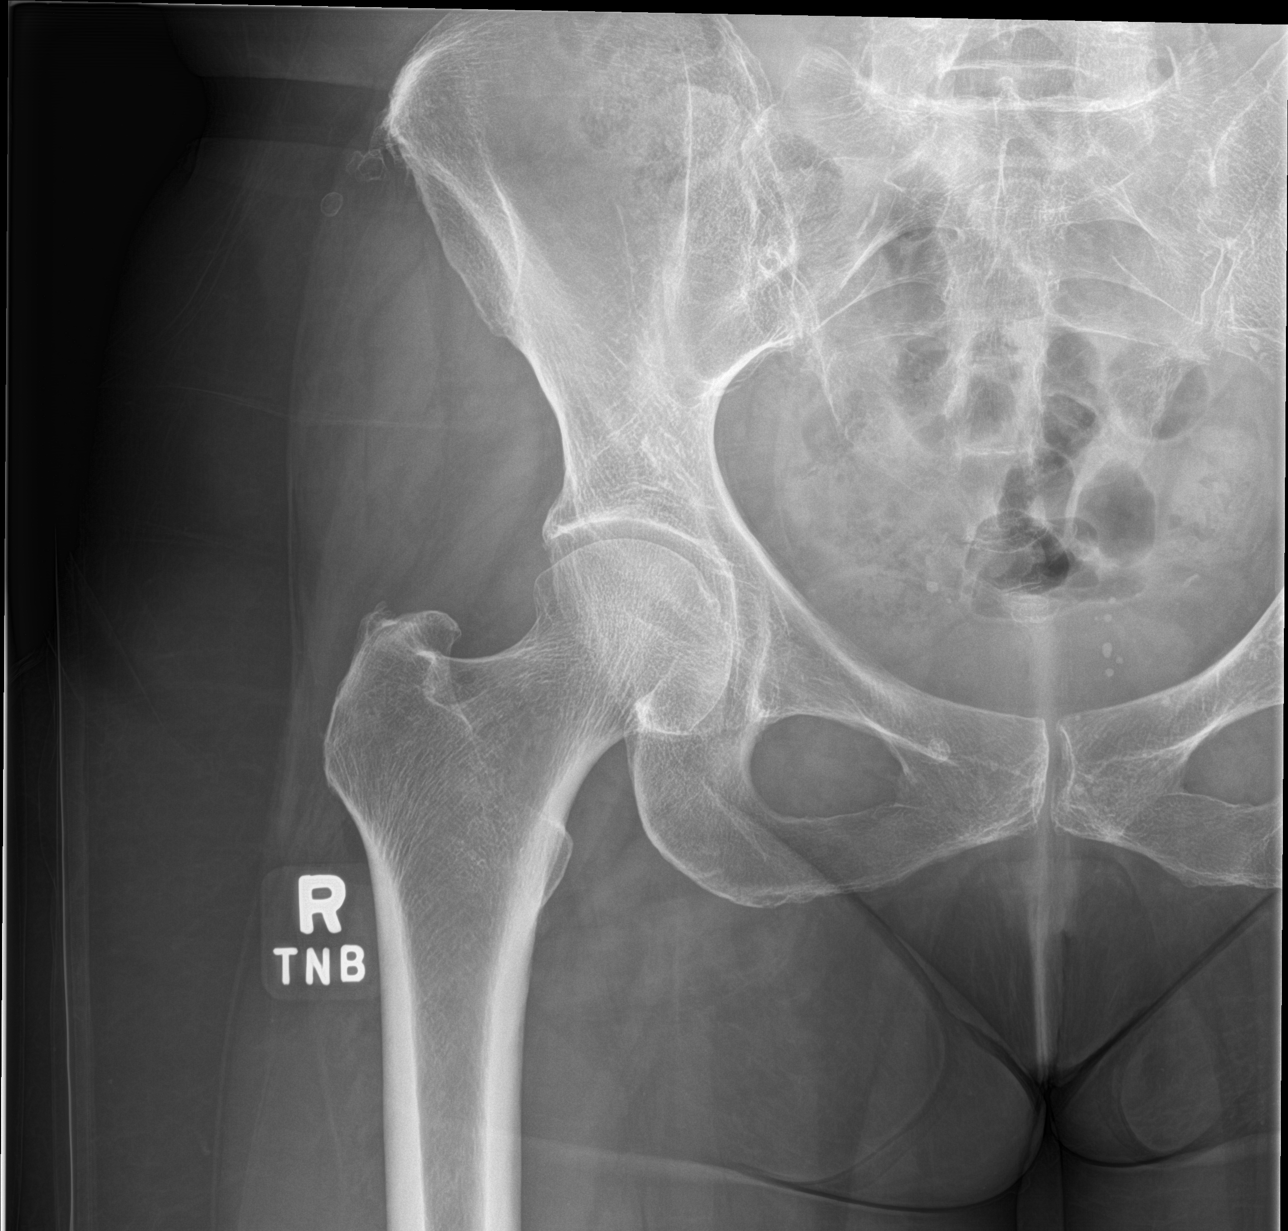

[hip frog leg]
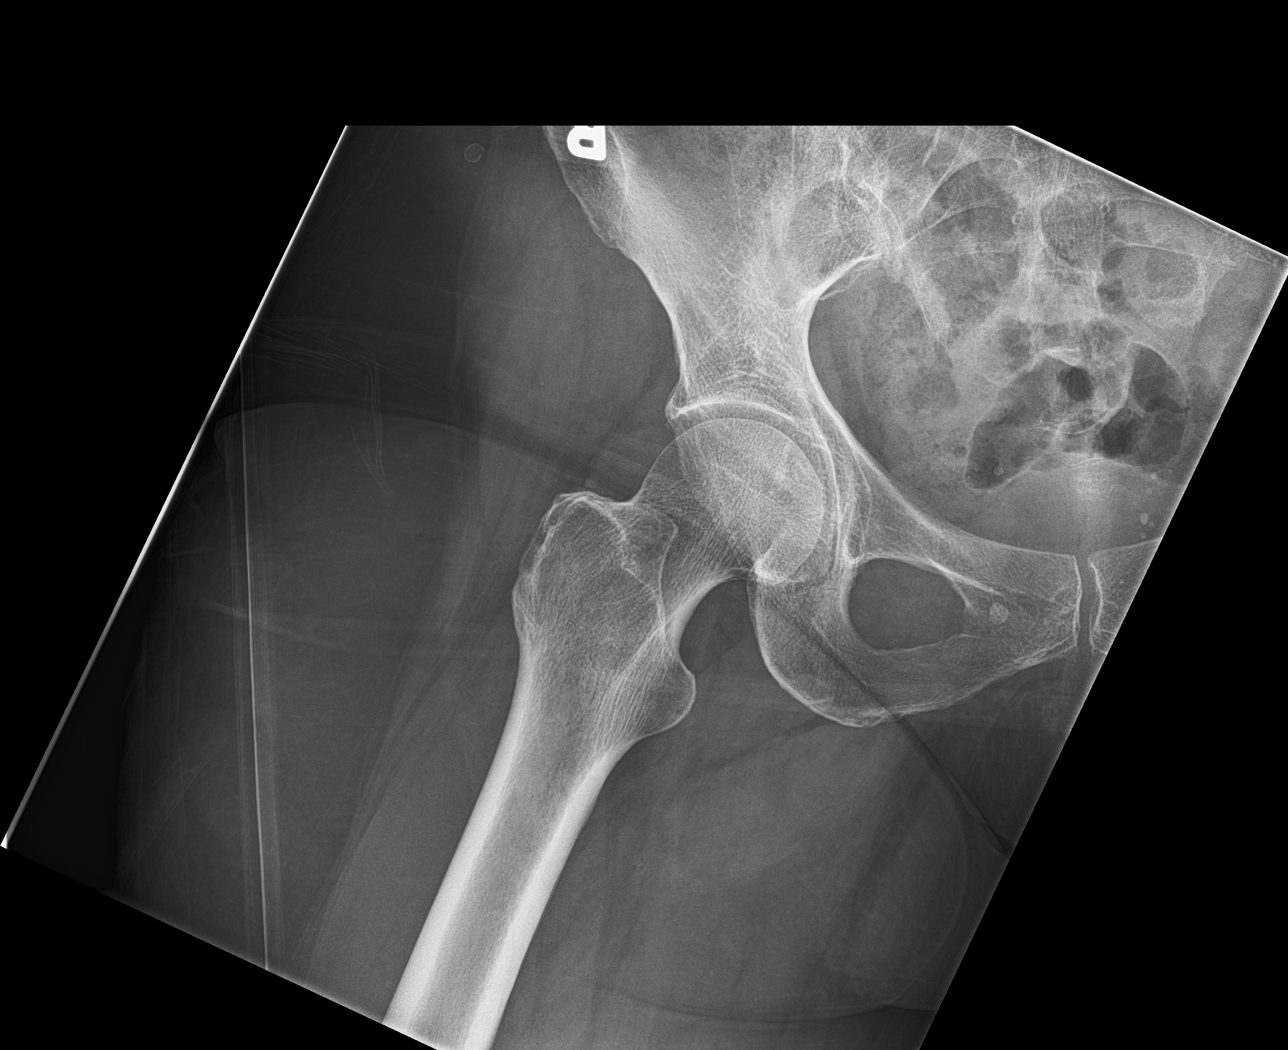

[3 of 3 positions shown; findings below may reference images not displayed]

FINDINGS: Intact right hip without fracture or malalignment. No subluxation or
dislocation. Bony pelvis and hips appear symmetric and intact
without significant arthropathy. Aortoiliac bifurcation
atherosclerosis noted. Nonobstructive bowel gas pattern. Additional
subcentimeter pelvic vascular calcifications.
IMPRESSION: No acute osseous finding.

Aortoiliac atherosclerosis

## 2021-07-19 IMAGING — DX DG FOOT COMPLETE 3+V*L*
3 series · 3 of 3 positions shown · non-contrast
Comparison: None.

CLINICAL DATA: Stepped on a sewing needle, concern for retained
foreign body

EXAM:
LEFT FOOT - COMPLETE 3+ VIEW

[foot ap]
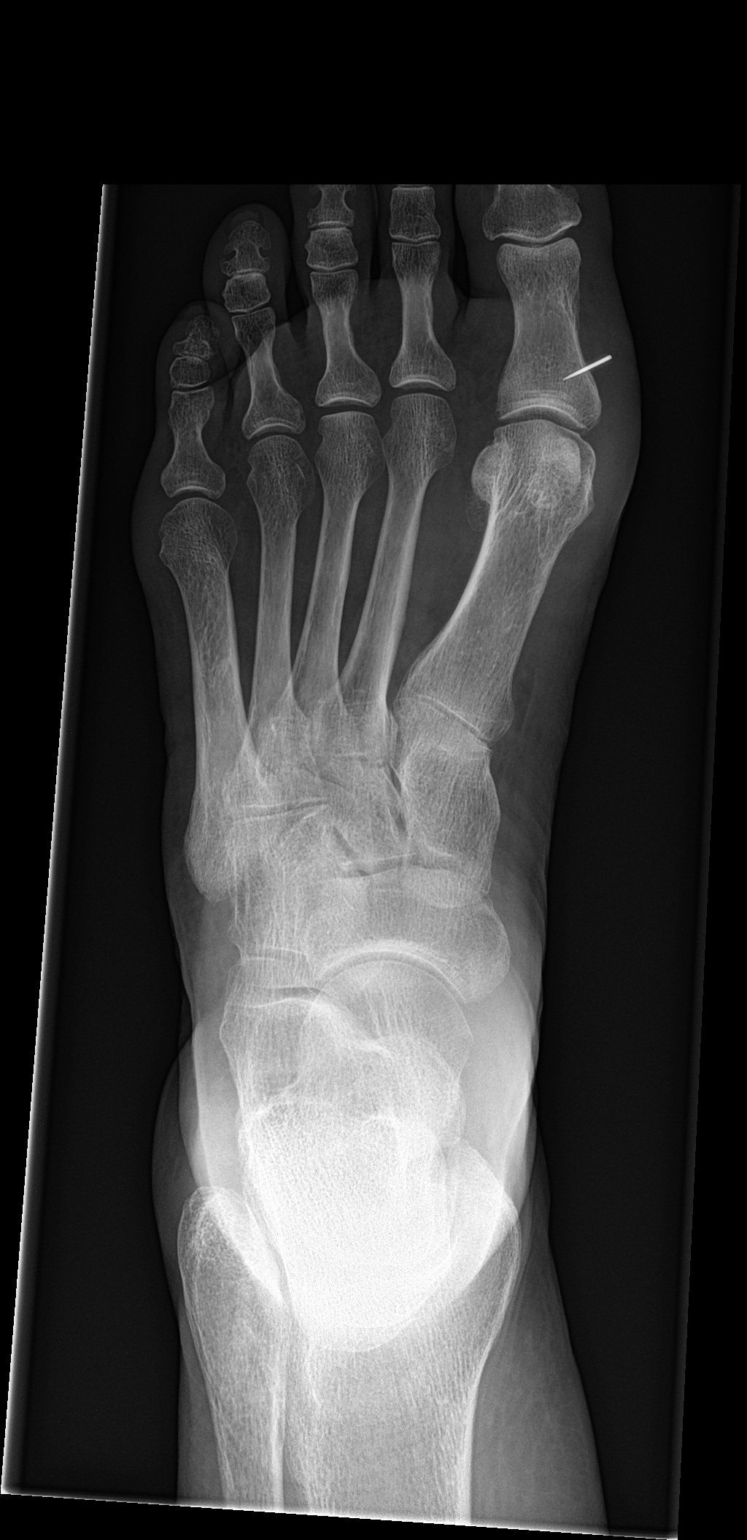

[foot obl]
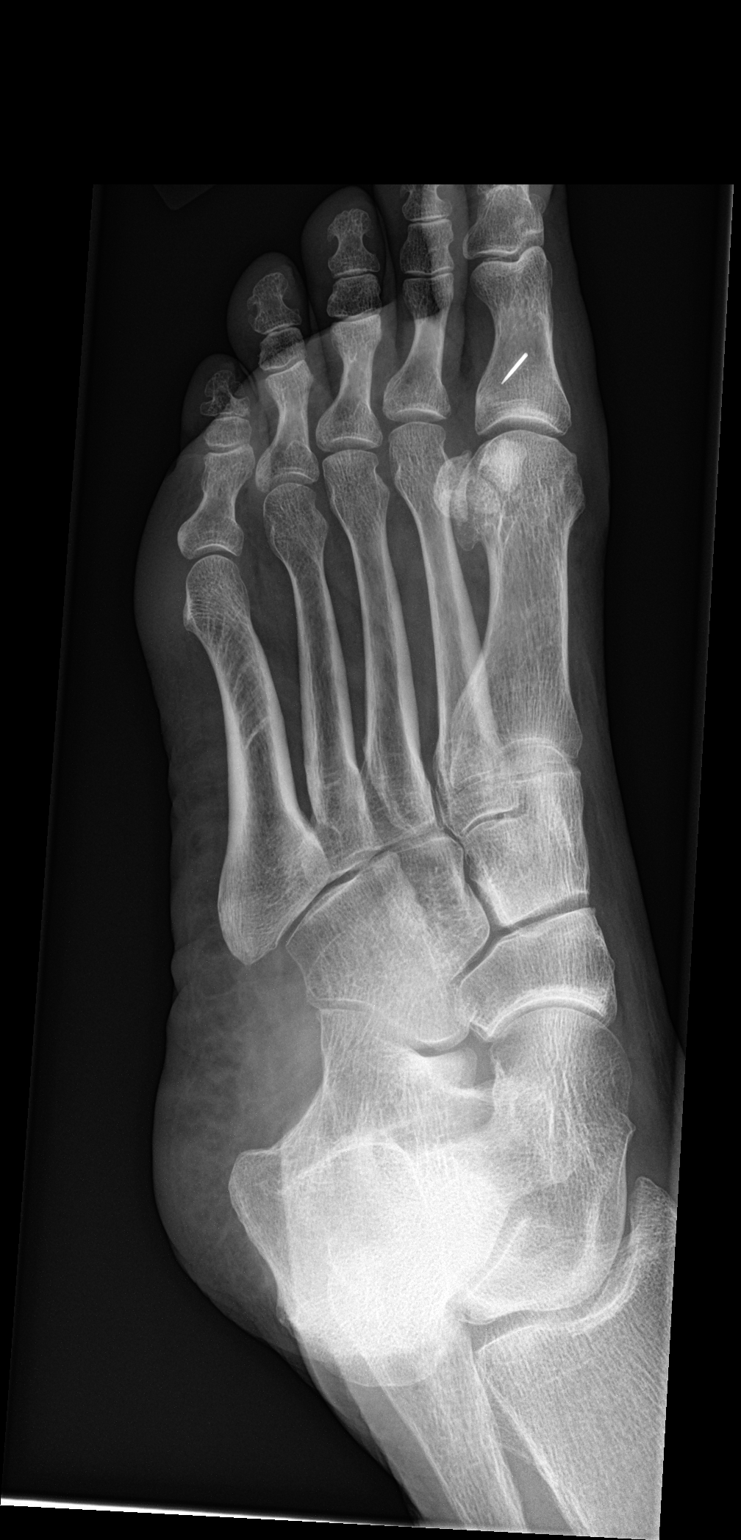

[foot lat]
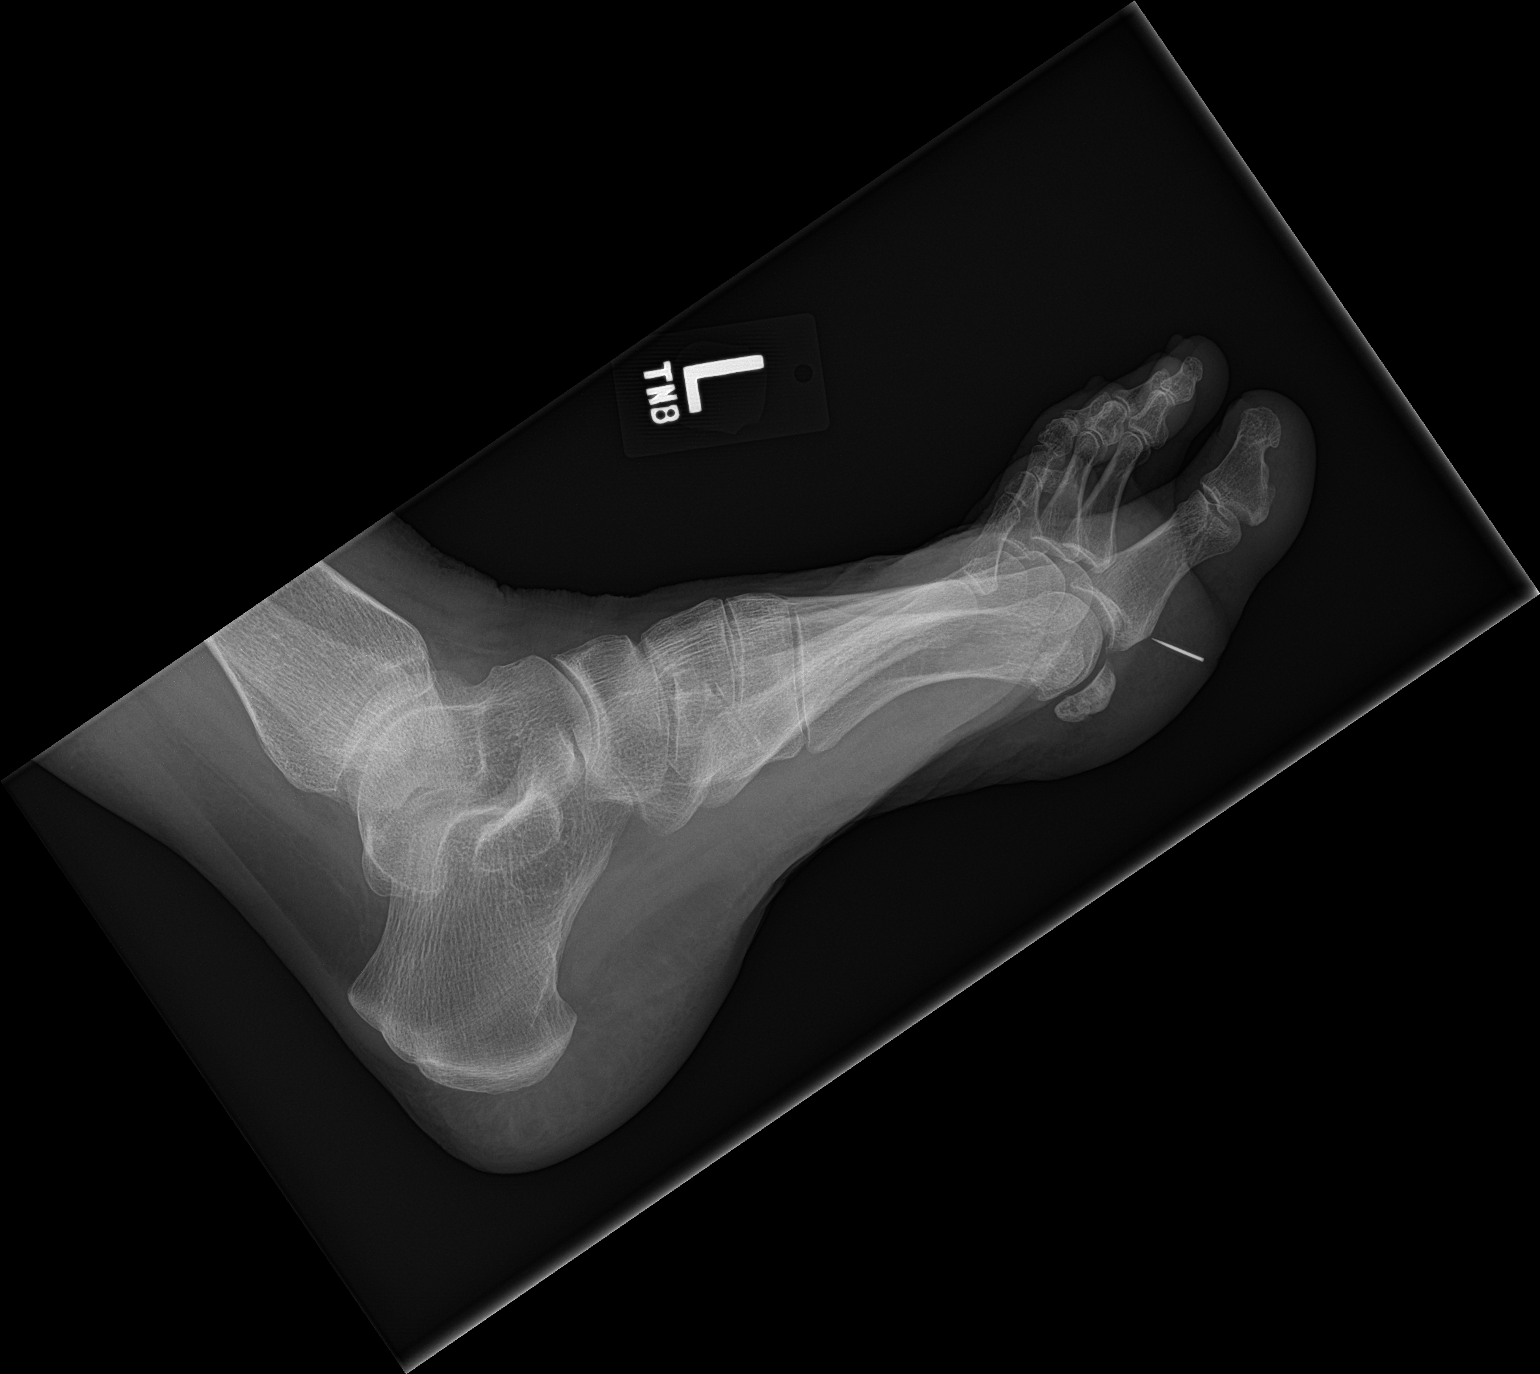

[3 of 3 positions shown; findings below may reference images not displayed]

FINDINGS: Linear radiopaque foreign body within the soft tissues of the left
great toe along the plantar surface beneath the first proximal
phalanx. Foreign body is compatible with a retained needle fragment.

No other acute osseous finding, fracture or joint abnormality.
IMPRESSION: Retained needle fragment foreign body as above.

## 2021-07-21 DIAGNOSIS — M25561 Pain in right knee: Secondary | ICD-10-CM | POA: Diagnosis not present

## 2021-07-26 IMAGING — MR MR MRA NECK WO/W CM
2 series · 38 of 48 positions shown · IV contrast (gadavist)
Comparison: Head CT 02/01/2020. Head MRI 04/01/2016. Head and neck
CTA 04/01/2016.

CLINICAL DATA: Multiple sclerosis. Bilateral arm and leg numbness,
weakness, and headaches.

EXAM:
MRI HEAD WITHOUT AND WITH CONTRAST
MRA HEAD WITHOUT CONTRAST
MRA NECK WITHOUT AND WITH CONTRAST
TECHNIQUE: Multiplanar, multiecho pulse sequences of the brain and surrounding
structures were obtained without and with intravenous contrast.
Angiographic images of the Circle of Willis were obtained using MRA
technique without intravenous contrast. Angiographic images of the
neck were obtained using MRA technique without and with intravenous
contrast. Carotid stenosis measurements (when applicable) are
obtained utilizing NASCET criteria, using the distal internal
carotid diameter as the denominator.
CONTRAST:  7.5mL GADAVIST GADOBUTROL 1 MMOL/ML IV SOLN

[Series 7: tof_fl3d_tra_iso · axial · 0.6mm · 0.52mm/px · z∈[-135,-42]mm · 28 of 164 slices shown]
[im 1/164]
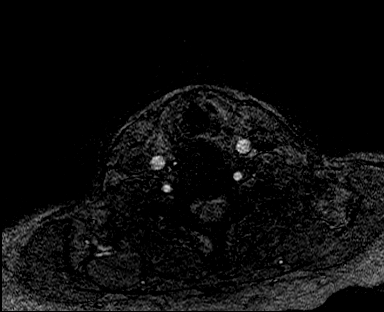
[im 6/164]
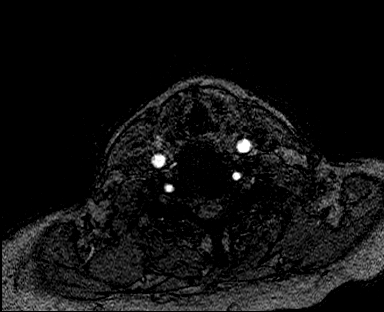
[im 11/164]
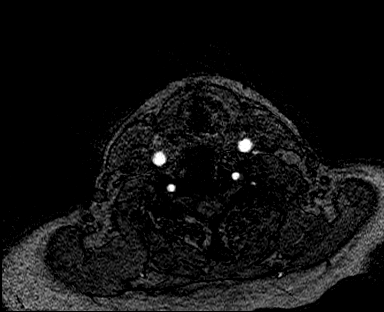
[im 16/164]
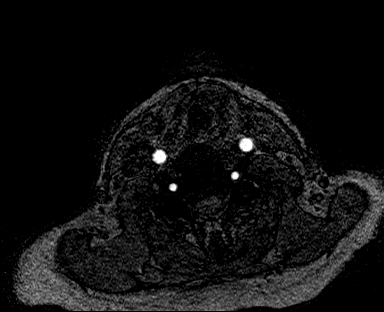
[im 22/164]
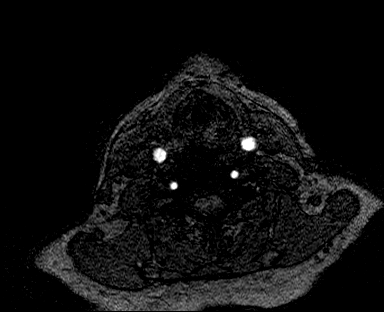
[im 27/164]
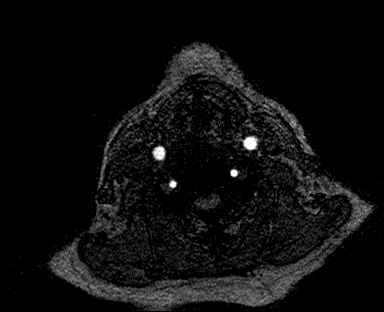
[im 32/164]
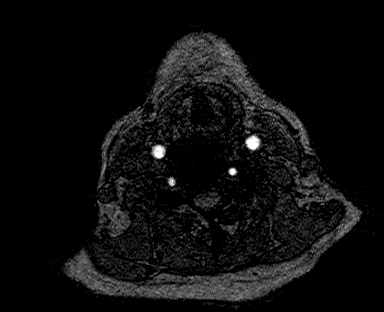
[im 37/164]
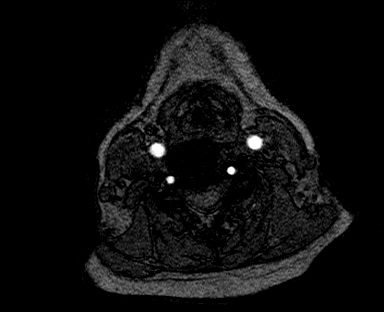
[im 43/164]
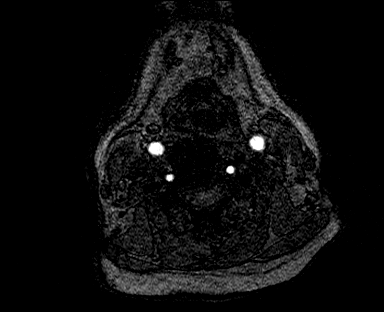
[im 48/164]
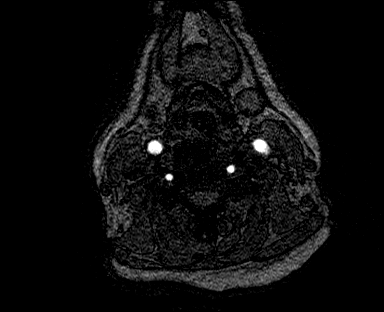
[im 53/164]
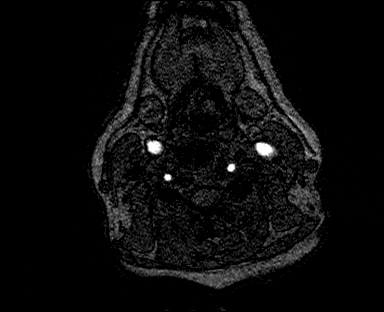
[im 58/164]
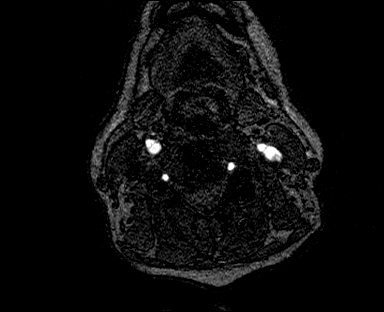
[im 64/164]
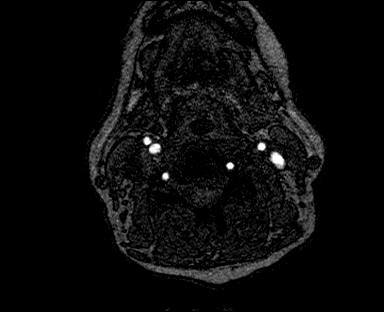
[im 69/164]
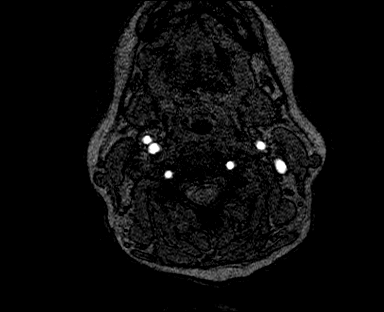
[im 74/164]
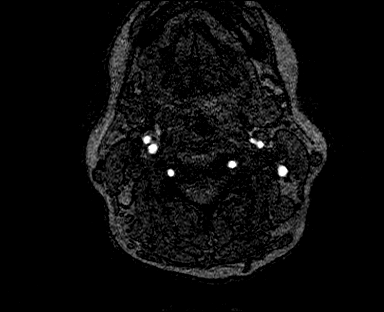
[im 79/164]
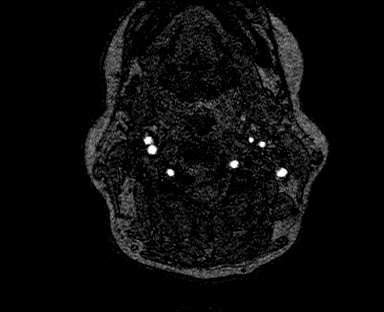
[im 85/164]
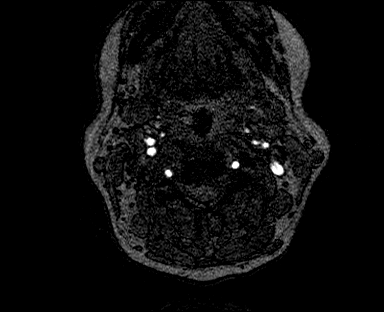
[im 90/164]
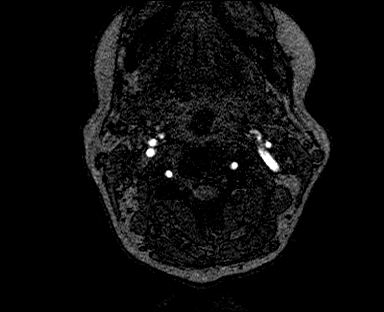
[im 95/164]
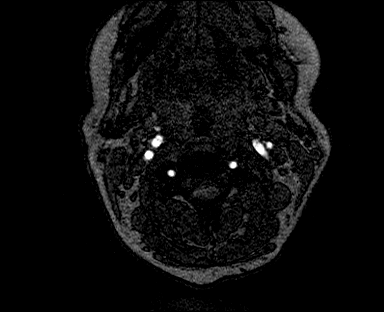
[im 100/164]
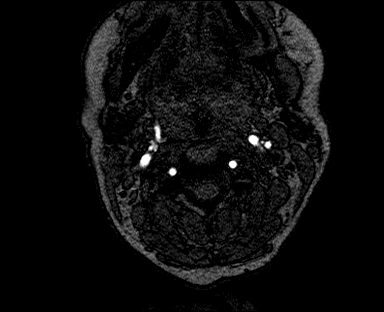
[im 106/164]
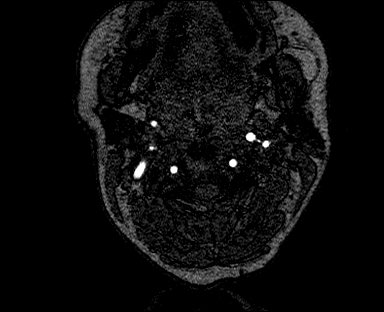
[im 111/164]
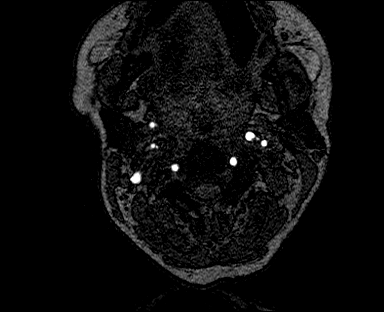
[im 116/164]
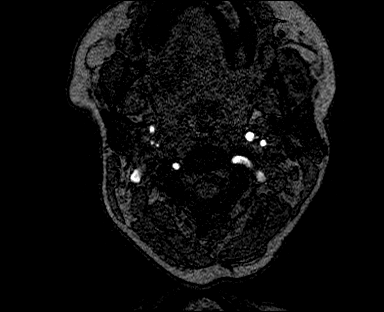
[im 121/164]
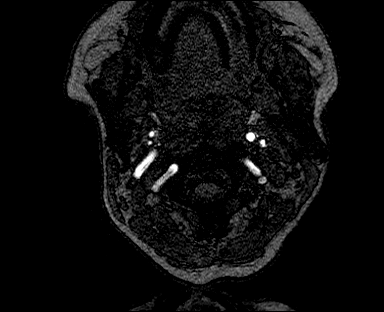
[im 127/164]
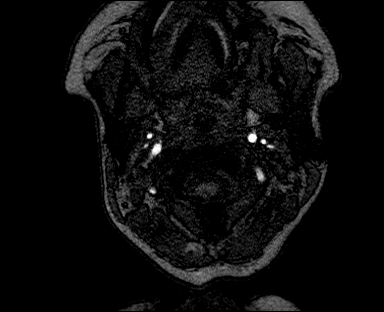
[im 132/164]
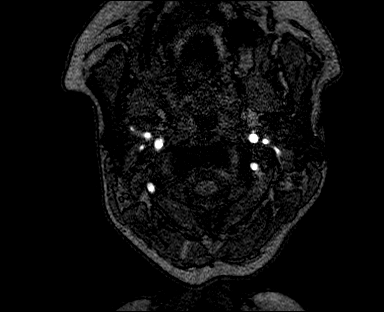
[im 137/164]
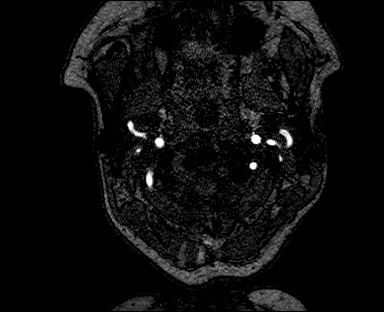
[im 158/164]
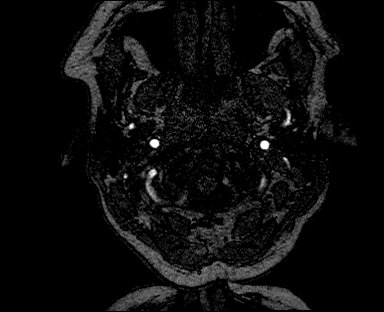

[Series 12: angio_fl3d_cor_post_ttc=3.0s · coronal · 0.9mm · 0.85mm/px · 10 of 79 slices shown]
[im 6/79]
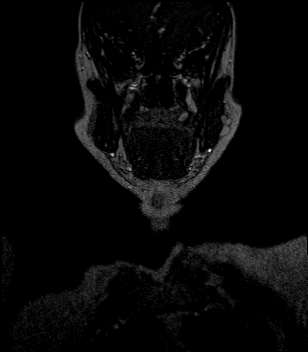
[im 11/79]
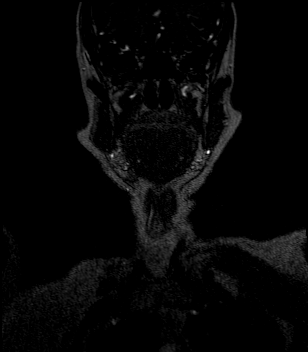
[im 16/79]
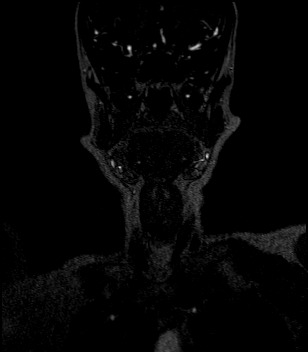
[im 27/79]
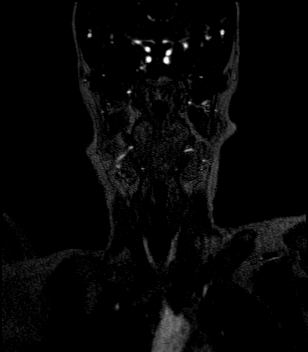
[im 37/79]
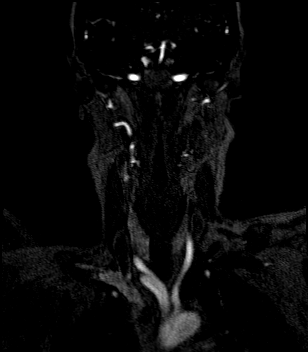
[im 42/79]
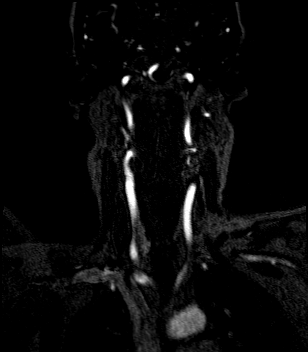
[im 47/79]
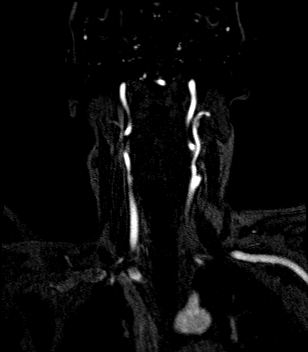
[im 58/79]
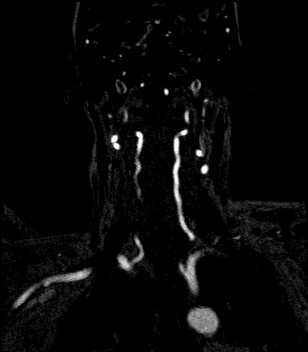
[im 68/79]
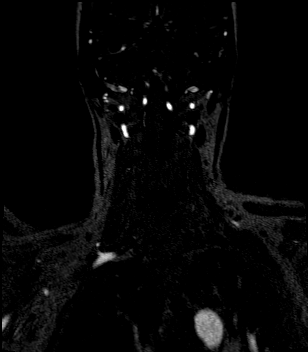
[im 79/79]
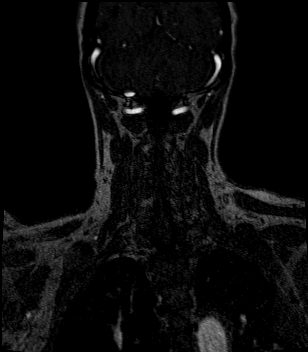

[38 of 48 positions shown; findings below may reference images not displayed]

FINDINGS: MRI HEAD FINDINGS

Brain: There is no evidence of an acute infarct, intracranial
hemorrhage, mass, midline shift, or extra-axial fluid collection.
The ventricles and sulci are normal. Scattered small foci of T2
FLAIR hyperintensity in the subcortical greater than periventricular
white matter bilaterally have slightly progressed from 4052. No
abnormal enhancement is identified. The brainstem and cerebellum are
normal in signal.

Vascular: Major intracranial vascular flow voids are preserved.

Skull and upper cervical spine: Unremarkable bone marrow signal.

Sinuses/Orbits: Unremarkable orbits. Paranasal sinuses and mastoid
air cells are clear.

Other: None.

MRA HEAD FINDINGS

The intracranial vertebral arteries are widely patent to the
basilar. Patent PICA and SCA origins are identified bilaterally. The
basilar artery is widely patent. There is a moderately large right
posterior communicating artery with hypoplastic right P1 segment.
Both PCAs are patent without evidence of a significant proximal
stenosis.

The internal carotid arteries are widely patent from skull base to
carotid termini. The 2 mm right cavernous ICA aneurysm described on
the prior CTA is not clearly demonstrated on this MRA. ACAs and MCAs
are patent without evidence of a proximal branch occlusion or
significant proximal stenosis.

MRA NECK FINDINGS

There is a standard 3 vessel aortic arch. The brachiocephalic and
subclavian arteries are widely patent. The common carotid and
cervical internal carotid arteries are patent without evidence of
stenosis or dissection.

The vertebral arteries are patent and codominant with antegrade flow
bilaterally. There is no evidence of a vertebral artery dissection
or stenosis.
IMPRESSION: 1. No acute intracranial abnormality.
2. Minimal cerebral white matter T2 signal changes, slightly
progressed from 4052 and nonspecific with considerations including
chronic small vessel ischemia, migraines, and prior
infection/inflammation. No specific findings to suggest
demyelinating disease.
3. Negative head MRA.
4. Negative neck MRA.

## 2021-07-26 IMAGING — MR MR MRA HEAD W/O CM
1 series · 18 of 48 positions shown · IV contrast (gadavist)
Comparison: Head CT 02/01/2020. Head MRI 04/01/2016. Head and neck
CTA 04/01/2016.

CLINICAL DATA: Multiple sclerosis. Bilateral arm and leg numbness,
weakness, and headaches.

EXAM:
MRI HEAD WITHOUT AND WITH CONTRAST
MRA HEAD WITHOUT CONTRAST
MRA NECK WITHOUT AND WITH CONTRAST
TECHNIQUE: Multiplanar, multiecho pulse sequences of the brain and surrounding
structures were obtained without and with intravenous contrast.
Angiographic images of the Circle of Willis were obtained using MRA
technique without intravenous contrast. Angiographic images of the
neck were obtained using MRA technique without and with intravenous
contrast. Carotid stenosis measurements (when applicable) are
obtained utilizing NASCET criteria, using the distal internal
carotid diameter as the denominator.
CONTRAST:  7.5mL GADAVIST GADOBUTROL 1 MMOL/ML IV SOLN

[Series 1: TOF · axial · 0.4mm · 0.42mm/px · z∈[-75,+20]mm · 18 of 254 slices shown]
[im 1/254]
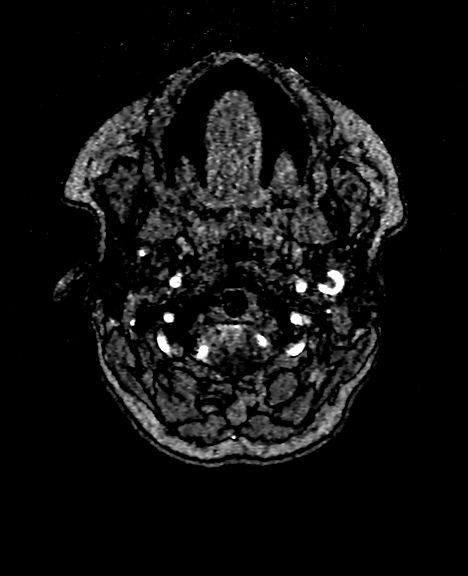
[im 6/254]
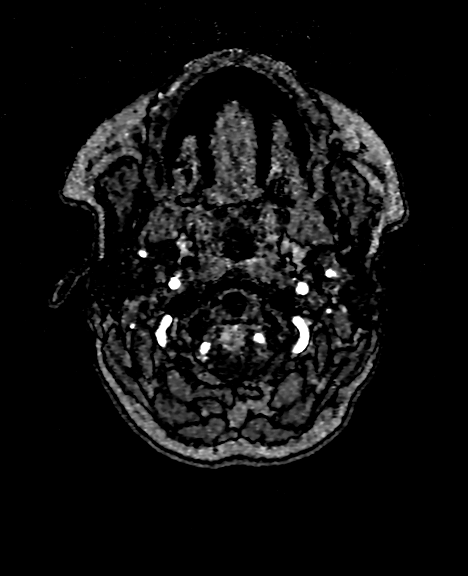
[im 11/254]
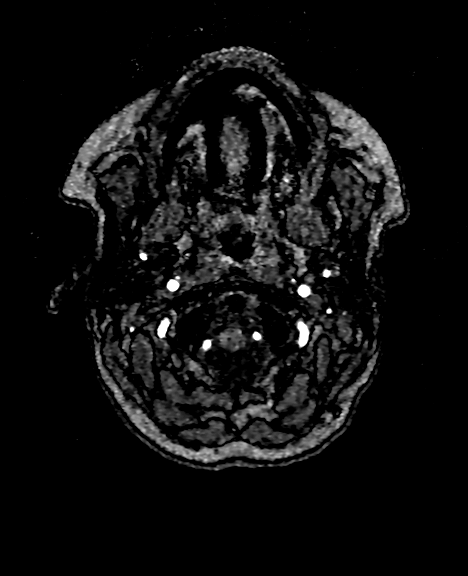
[im 17/254]
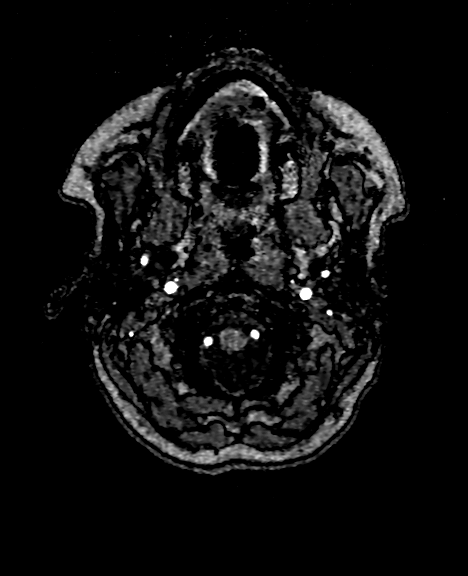
[im 22/254]
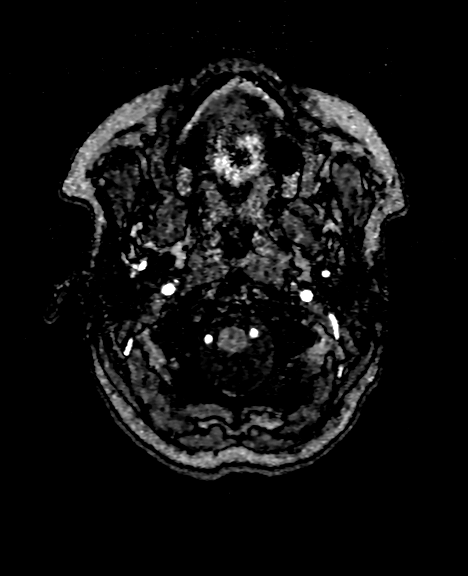
[im 27/254]
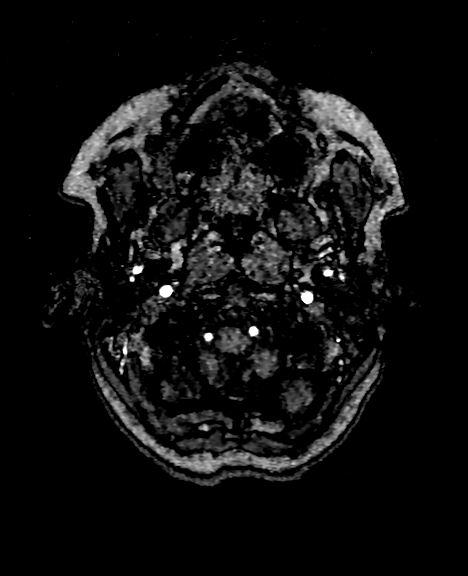
[im 33/254]
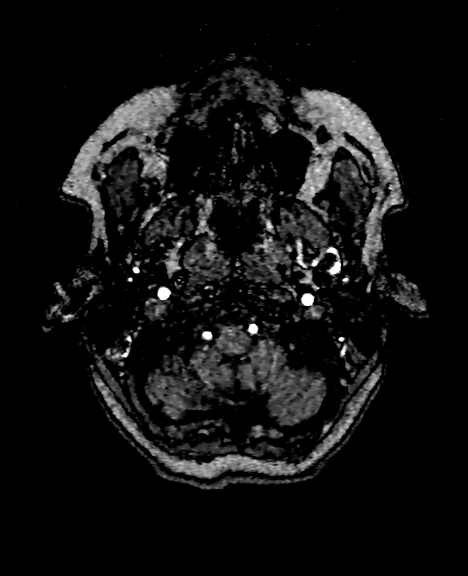
[im 38/254]
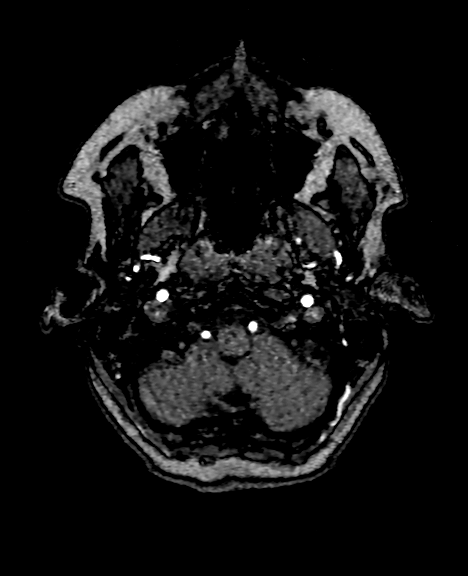
[im 44/254]
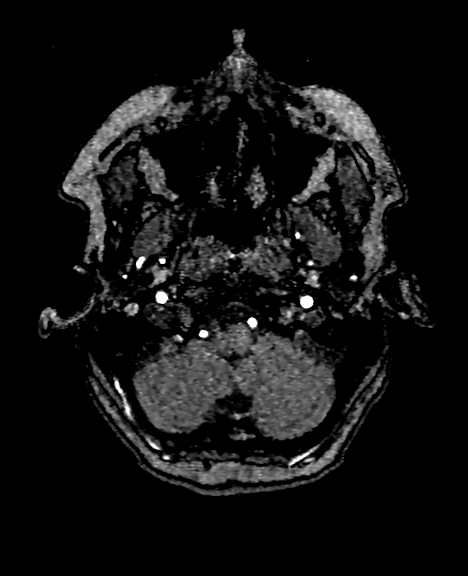
[im 49/254]
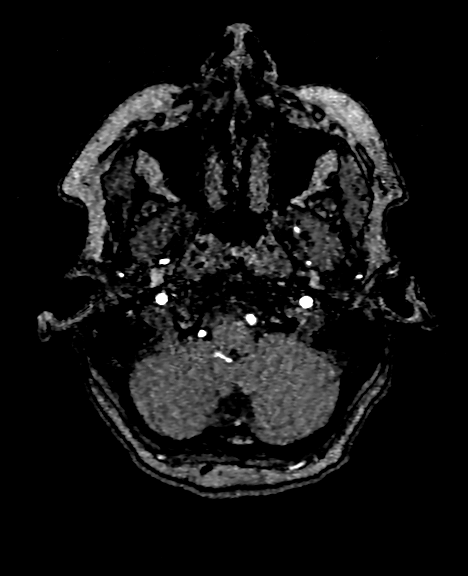
[im 81/254]
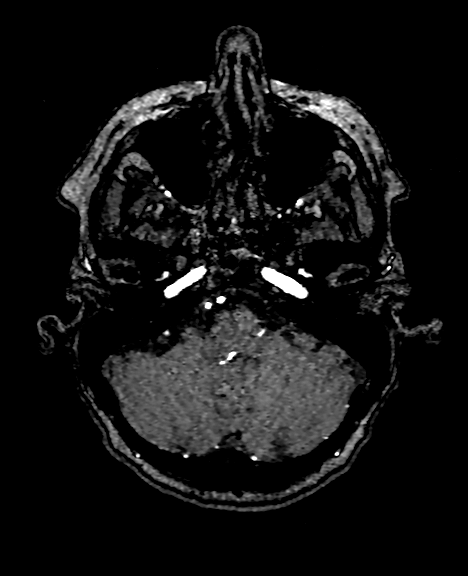
[im 114/254]
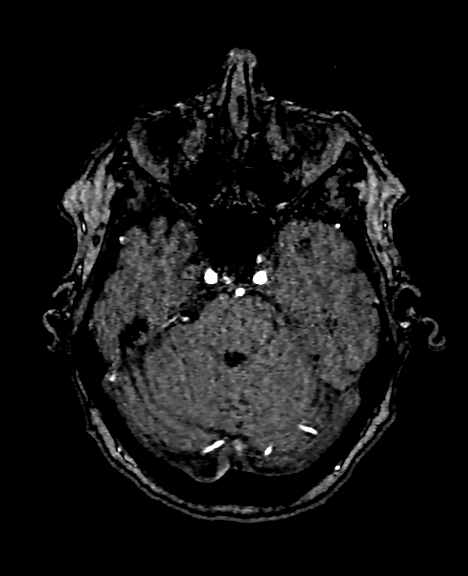
[im 130/254]
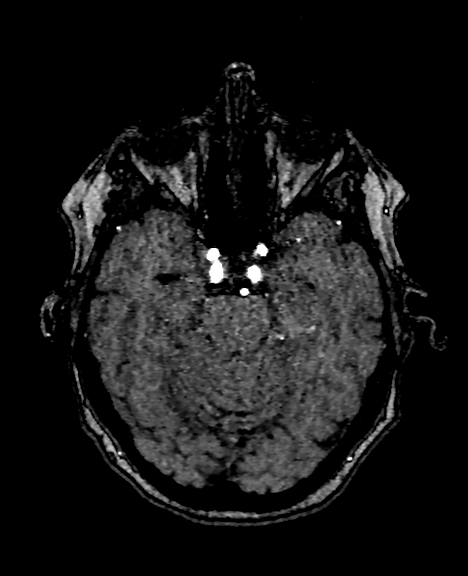
[im 146/254]
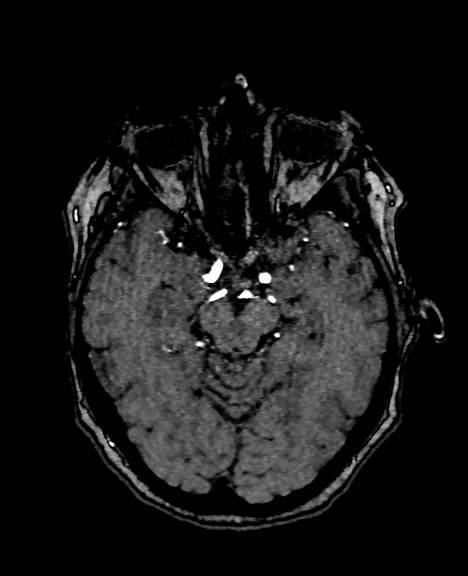
[im 178/254]
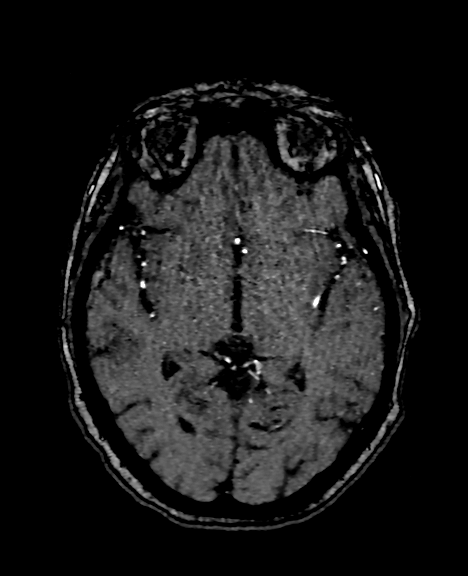
[im 210/254]
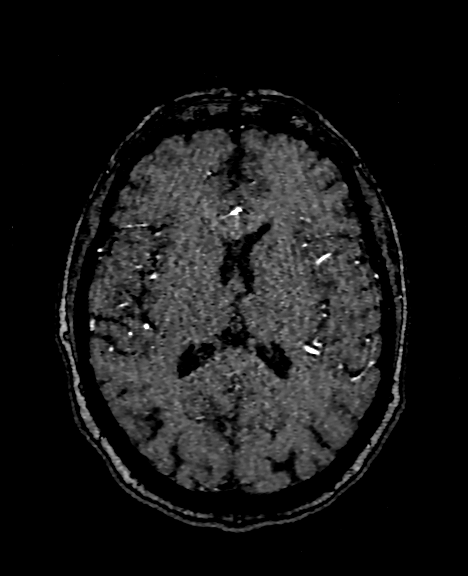
[im 216/254]
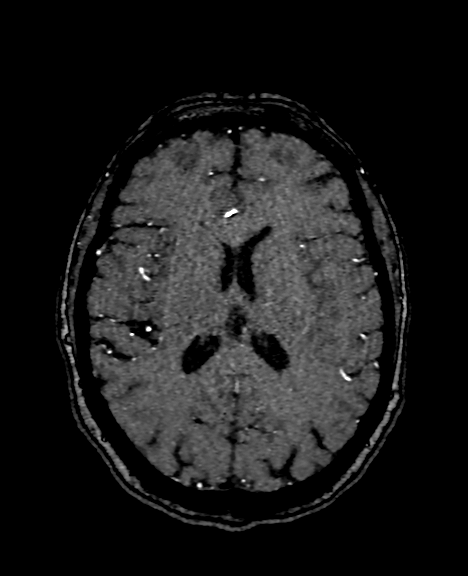
[im 243/254]
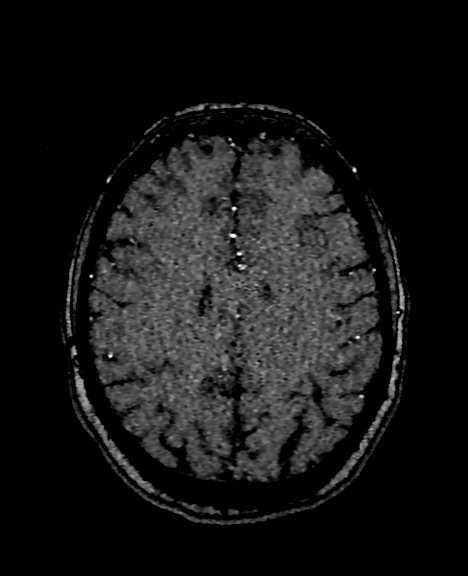

[18 of 48 positions shown; findings below may reference images not displayed]

FINDINGS: MRI HEAD FINDINGS

Brain: There is no evidence of an acute infarct, intracranial
hemorrhage, mass, midline shift, or extra-axial fluid collection.
The ventricles and sulci are normal. Scattered small foci of T2
FLAIR hyperintensity in the subcortical greater than periventricular
white matter bilaterally have slightly progressed from 4052. No
abnormal enhancement is identified. The brainstem and cerebellum are
normal in signal.

Vascular: Major intracranial vascular flow voids are preserved.

Skull and upper cervical spine: Unremarkable bone marrow signal.

Sinuses/Orbits: Unremarkable orbits. Paranasal sinuses and mastoid
air cells are clear.

Other: None.

MRA HEAD FINDINGS

The intracranial vertebral arteries are widely patent to the
basilar. Patent PICA and SCA origins are identified bilaterally. The
basilar artery is widely patent. There is a moderately large right
posterior communicating artery with hypoplastic right P1 segment.
Both PCAs are patent without evidence of a significant proximal
stenosis.

The internal carotid arteries are widely patent from skull base to
carotid termini. The 2 mm right cavernous ICA aneurysm described on
the prior CTA is not clearly demonstrated on this MRA. ACAs and MCAs
are patent without evidence of a proximal branch occlusion or
significant proximal stenosis.

MRA NECK FINDINGS

There is a standard 3 vessel aortic arch. The brachiocephalic and
subclavian arteries are widely patent. The common carotid and
cervical internal carotid arteries are patent without evidence of
stenosis or dissection.

The vertebral arteries are patent and codominant with antegrade flow
bilaterally. There is no evidence of a vertebral artery dissection
or stenosis.
IMPRESSION: 1. No acute intracranial abnormality.
2. Minimal cerebral white matter T2 signal changes, slightly
progressed from 4052 and nonspecific with considerations including
chronic small vessel ischemia, migraines, and prior
infection/inflammation. No specific findings to suggest
demyelinating disease.
3. Negative head MRA.
4. Negative neck MRA.

## 2021-07-26 IMAGING — MR MR CERVICAL SPINE WO/W CM
6 of 8 series · 31 of 48 positions shown · IV contrast (gadavist)
Comparison: 04/01/2016

CLINICAL DATA: Multiple sclerosis. Bilateral arm and leg numbness,
weakness, and headaches.

EXAM:
MRI CERVICAL SPINE WITHOUT AND WITH CONTRAST
TECHNIQUE: Multiplanar and multiecho pulse sequences of the cervical spine, to
include the craniocervical junction and cervicothoracic junction,
were obtained without and with intravenous contrast.
CONTRAST:  7.5mL GADAVIST GADOBUTROL 1 MMOL/ML IV SOLN

[Series 9: T1 · sagittal · 3.0mm · 0.86mm/px · 4 of 15 slices shown (1 of 2)]
[im 1/15]
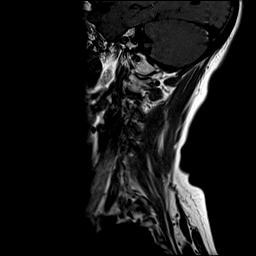
[im 5/15]
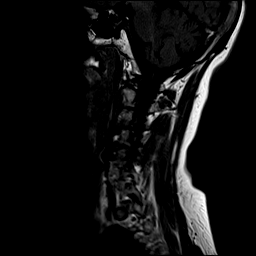
[im 10/15]
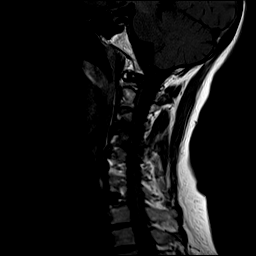
[im 15/15]
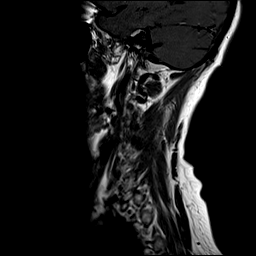

[Series 10: STIR · sagittal · 3.0mm · 0.69mm/px · 3 of 15 slices shown]
[im 1/15]
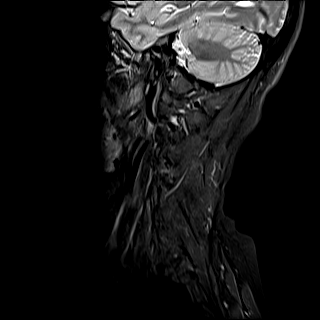
[im 5/15]
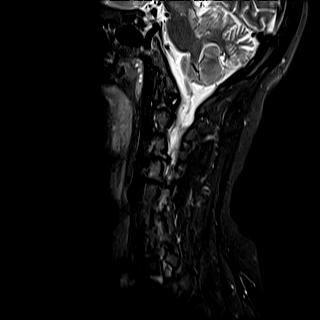
[im 10/15]
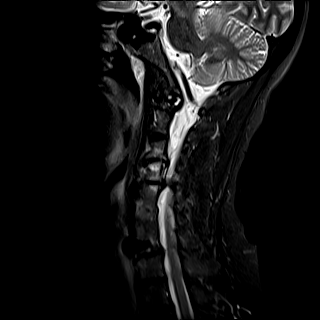

[Series 11: T2 · axial · 3.0mm · 0.70mm/px · z∈[-205,-86]mm · 8 of 36 slices shown (1 of 2)]
[im 1/36]
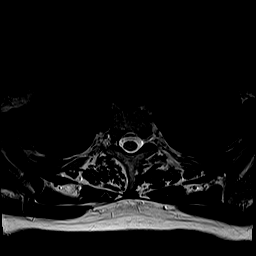
[im 6/36]
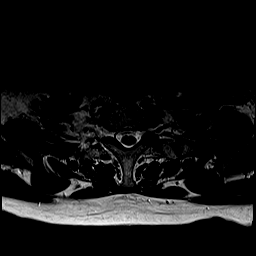
[im 11/36]
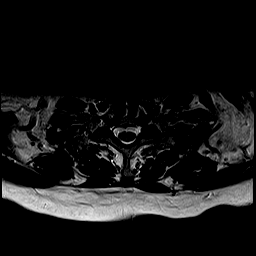
[im 16/36]
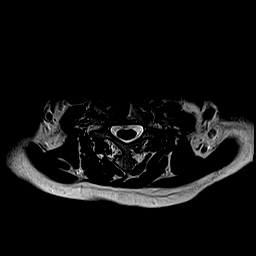
[im 21/36]
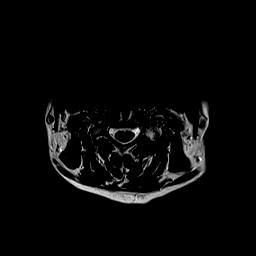
[im 26/36]
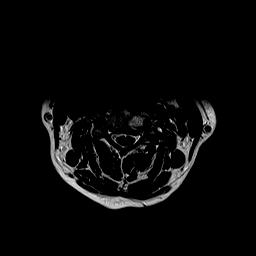
[im 31/36]
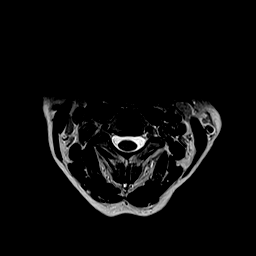
[im 36/36]
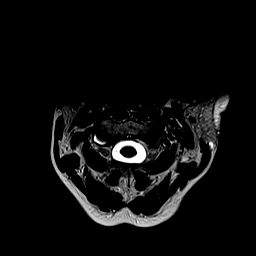

[Series 13: T1 · axial · 3.0mm · 0.35mm/px · z∈[-205,-86]mm · 8 of 36 slices shown (2 of 2)]
[im 1/36]
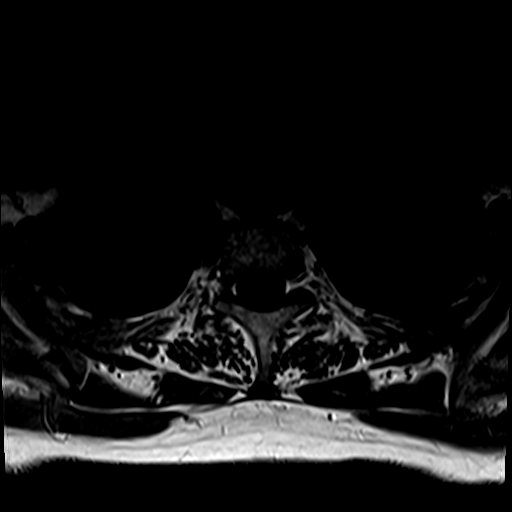
[im 6/36]
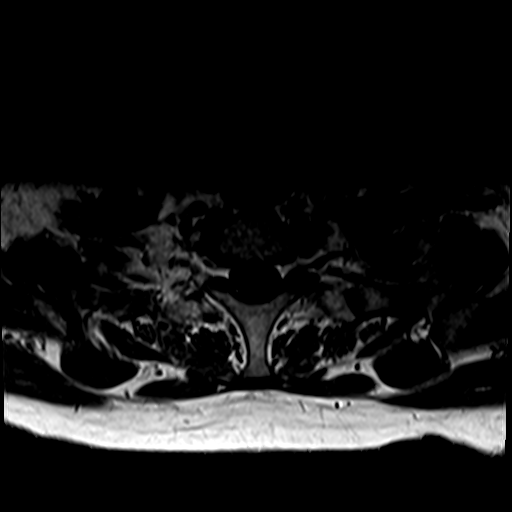
[im 11/36]
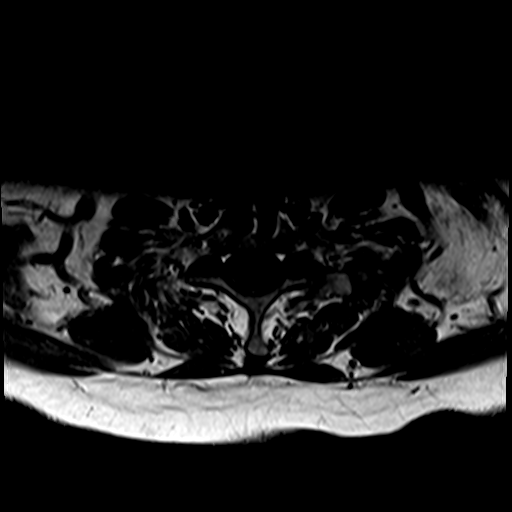
[im 16/36]
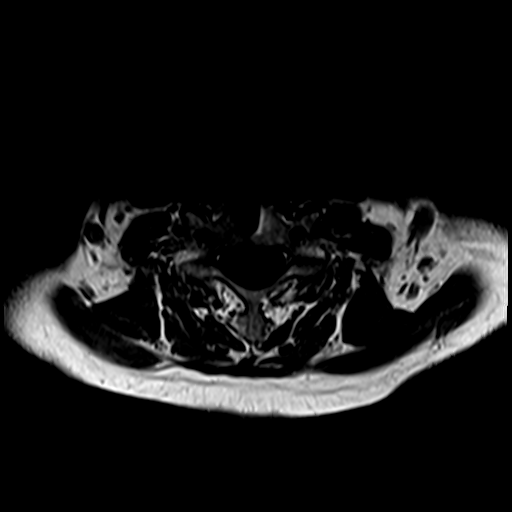
[im 21/36]
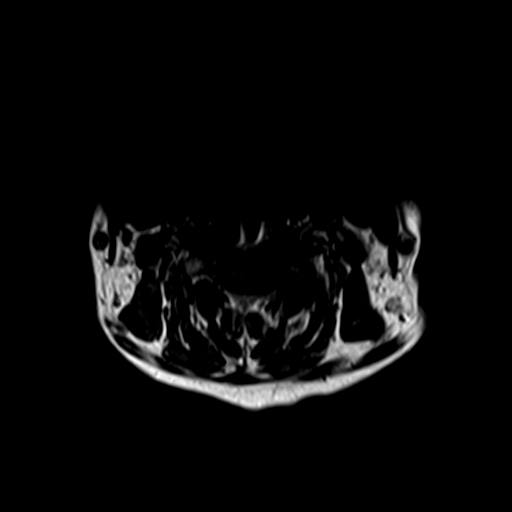
[im 26/36]
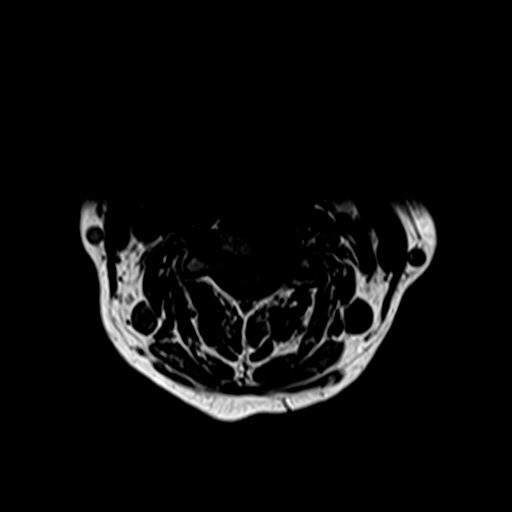
[im 31/36]
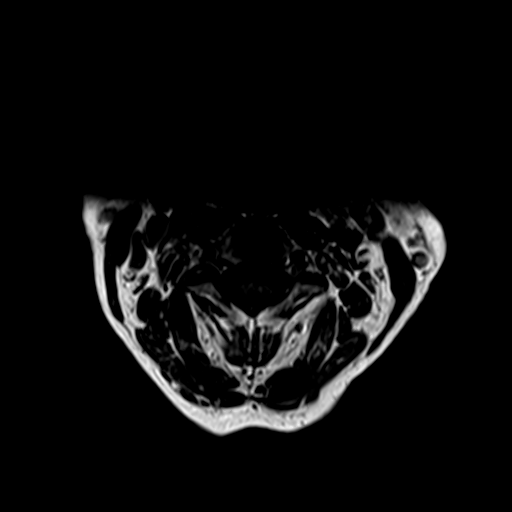
[im 36/36]
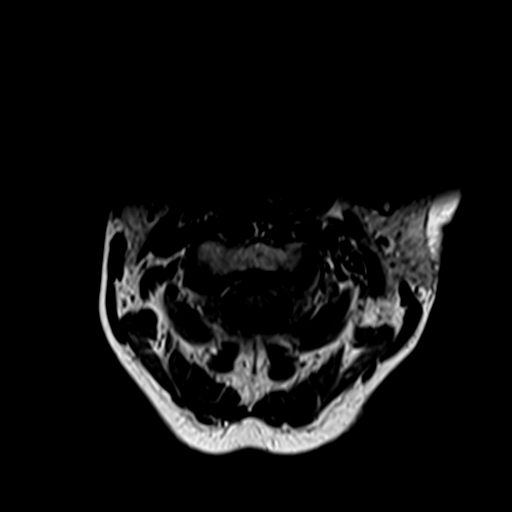

[Series 14: T2 · sagittal · 3.0mm · 0.69mm/px · 4 of 15 slices shown (2 of 2)]
[im 1/15]
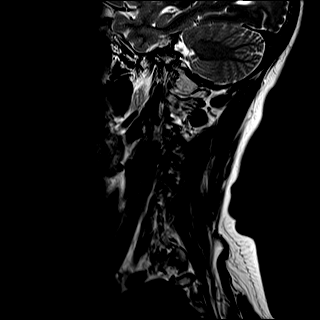
[im 5/15]
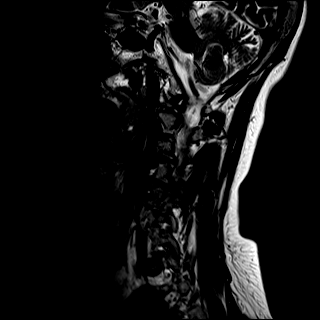
[im 10/15]
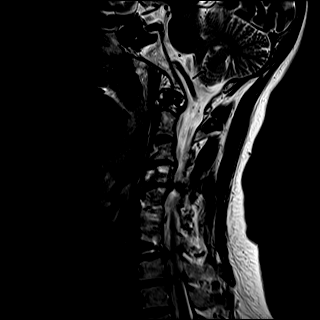
[im 15/15]
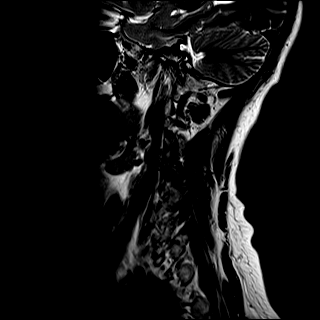

[Series 15: T1 fat-sat post-contrast · sagittal · 3.0mm · 0.43mm/px · 4 of 15 slices shown]
[im 1/15]
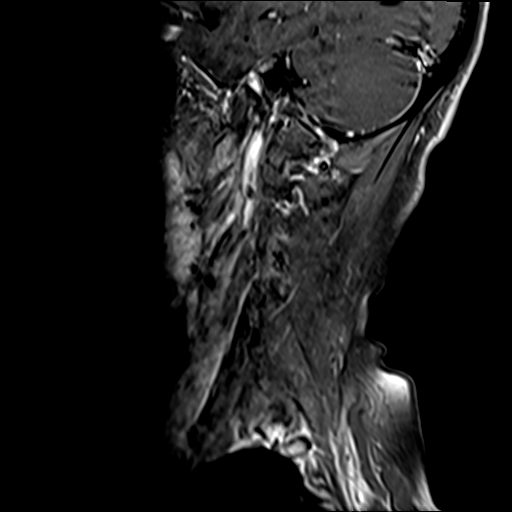
[im 5/15]
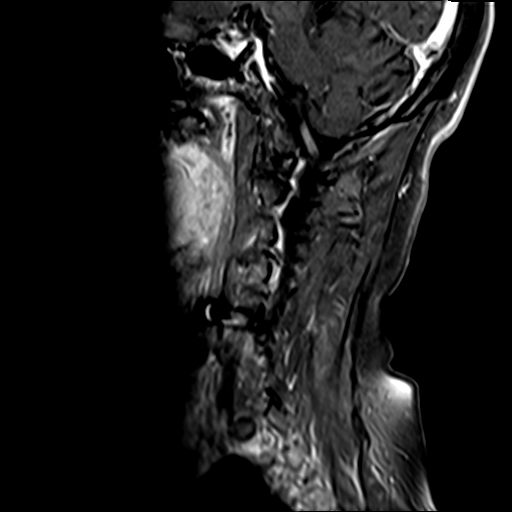
[im 10/15]
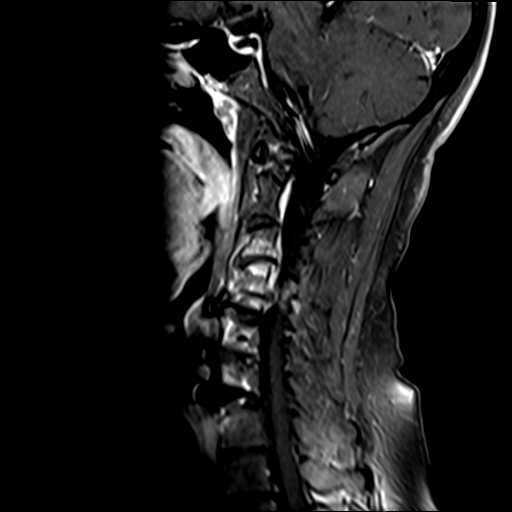
[im 15/15]
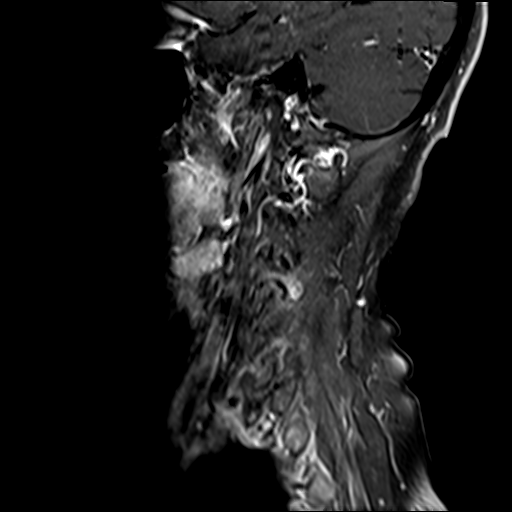

[31 of 48 positions shown; findings below may reference images not displayed]

FINDINGS: Alignment: Straightening of the normal cervical lordosis. No
significant listhesis.

Vertebrae: No fracture or suspicious osseous lesion. Prior C5-C7
ACDF. New degenerative endplate edema at C3-4 and left facet edema
at C4-5.

Cord: Normal cord signal and morphology. No abnormal intradural
enhancement.

Posterior Fossa, vertebral arteries, paraspinal tissues: Posterior
fossa more fully evaluated on separate head MRI. Preserved vertebral
artery flow voids.

Disc levels:

C2-3: Negative.

C3-4: Disc bulging, uncovertebral spurring, and mild left greater
than right facet arthrosis result in mild spinal stenosis and
moderate to severe bilateral neural foraminal stenosis, mildly
progressed.

C4-5: Disc bulging, uncovertebral spurring, and moderate left facet
arthrosis result in mild spinal stenosis and severe bilateral neural
foraminal stenosis, mildly progressed.

C5-6: ACDF.  No stenosis.

C6-7: ACDF. Unchanged focal left paracentral spurring. No stenosis.

C7-T1: Disc bulging, uncovertebral spurring, and moderate facet
arthrosis result in borderline spinal stenosis and moderate
bilateral neural foraminal stenosis, not significantly changed.

T1-2: Disc bulging and endplate spurring result in mild right and
moderate left neural foraminal stenosis, mildly progressed. No
spinal stenosis.
IMPRESSION: 1. Mildly progressive disc and facet degeneration at C3-4 and C4-5
with mild spinal stenosis and advanced neural foraminal stenosis at
both levels.
2. Mildly progressive cervical disc and facet degeneration.
3. Unchanged moderate bilateral neural foraminal stenosis at C7-T1.
4. No evidence of demyelinating disease in the cervical spinal cord.

## 2021-07-26 IMAGING — MR MR HEAD WO/W CM
14 of 15 series · 38 of 48 positions shown · IV contrast (7.5 ml Gadavist)
Comparison: Head CT 02/01/2020. Head MRI 04/01/2016. Head and neck
CTA 04/01/2016.

CLINICAL DATA: Multiple sclerosis. Bilateral arm and leg numbness,
weakness, and headaches.

EXAM:
MRI HEAD WITHOUT AND WITH CONTRAST
MRA HEAD WITHOUT CONTRAST
MRA NECK WITHOUT AND WITH CONTRAST
TECHNIQUE: Multiplanar, multiecho pulse sequences of the brain and surrounding
structures were obtained without and with intravenous contrast.
Angiographic images of the Circle of Willis were obtained using MRA
technique without intravenous contrast. Angiographic images of the
neck were obtained using MRA technique without and with intravenous
contrast. Carotid stenosis measurements (when applicable) are
obtained utilizing NASCET criteria, using the distal internal
carotid diameter as the denominator.
CONTRAST:  7.5mL GADAVIST GADOBUTROL 1 MMOL/ML IV SOLN

[Series 1: DWI · axial · 3.0mm · 0.77mm/px · z∈[-71,+73]mm · 2 of 50 slices shown (1 of 4)]
[im 1/50]
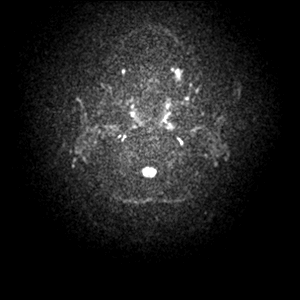
[im 50/50]
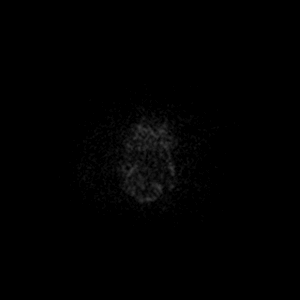

[Series 2: DWI · axial · 3.0mm · 0.77mm/px · z∈[-71,+73]mm · 3 of 50 slices shown (2 of 4)]
[im 1/50]
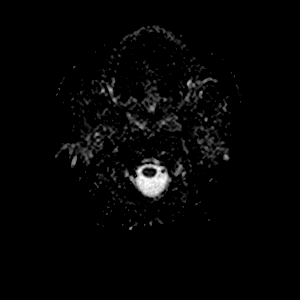
[im 25/50]
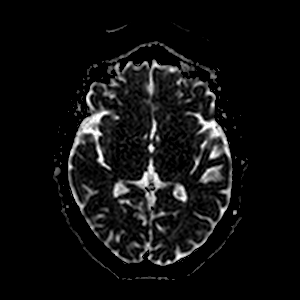
[im 50/50]
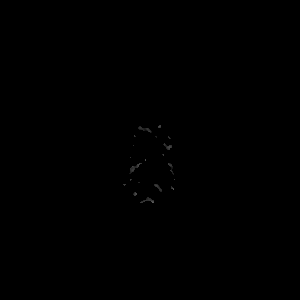

[Series 3: DWI · coronal · 5.0mm · 0.88mm/px · 2 of 30 slices shown (3 of 4)]
[im 1/30]
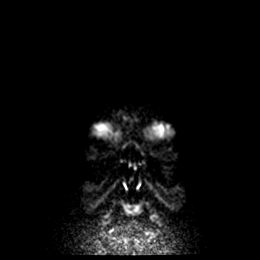
[im 30/30]
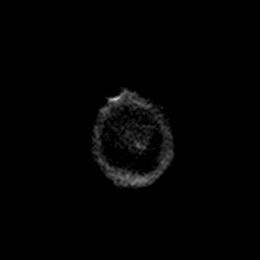

[Series 4: DWI · coronal · 5.0mm · 0.88mm/px · 2 of 29 slices shown (4 of 4)]
[im 1/29]
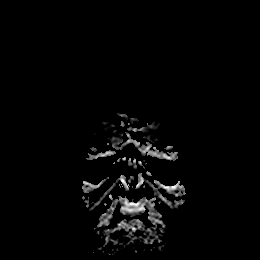
[im 29/29]
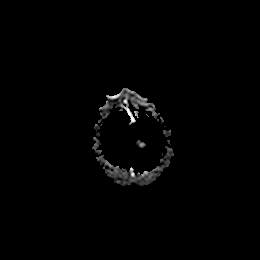

[Series 5: T1 · sagittal · 5.0mm · 0.75mm/px · 1 of 21 slices shown (1 of 2)]
[im 1/21]
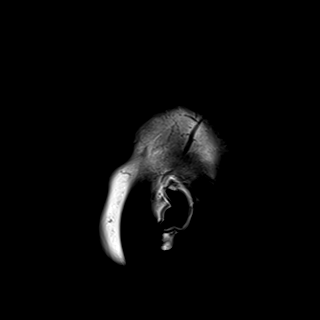

[Series 6: T2 · axial · 5.0mm · 0.72mm/px · 1 of 23 slices shown (1 of 2)]
[im 1/23]
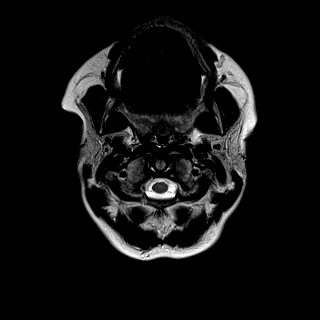

[Series 7: mag_images · axial · 3.0mm · 0.90mm/px · z∈[-79,+72]mm · 3 of 52 slices shown]
[im 1/52]
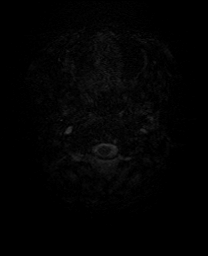
[im 26/52]
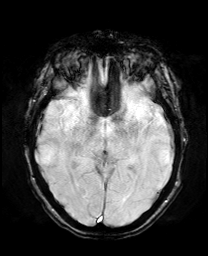
[im 52/52]
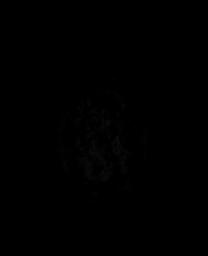

[Series 8: pha_images · axial · 3.0mm · 0.90mm/px · z∈[-79,+72]mm · 3 of 52 slices shown]
[im 1/52]
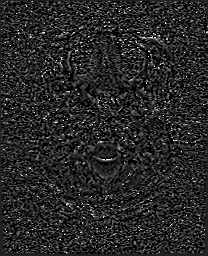
[im 26/52]
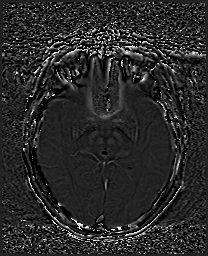
[im 52/52]
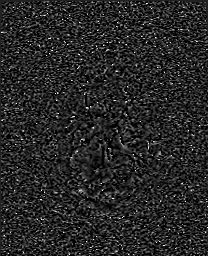

[Series 9: swi_images · axial · 3.0mm · 0.90mm/px · z∈[-79,+72]mm · 3 of 52 slices shown]
[im 1/52]
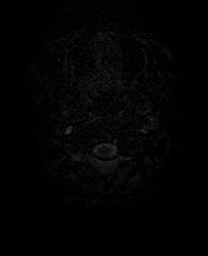
[im 26/52]
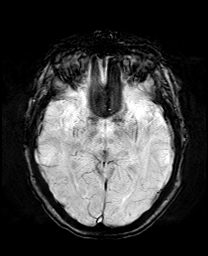
[im 52/52]
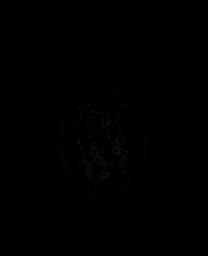

[Series 11: FLAIR · axial · 3.0mm · 0.45mm/px · z∈[-76,+69]mm · 3 of 50 slices shown]
[im 1/50]
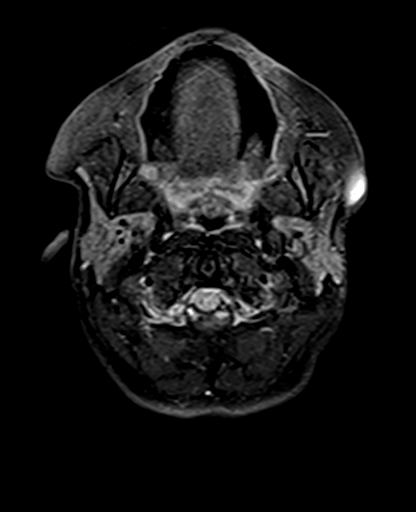
[im 25/50]
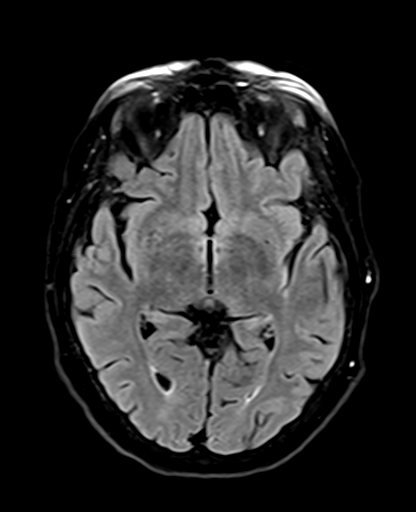
[im 50/50]
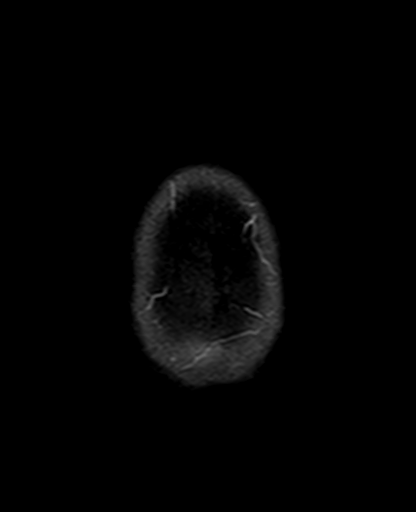

[Series 12: T1 · axial · 1.0mm · 0.98mm/px · z∈[-84,+72]mm · 10 of 160 slices shown (2 of 2)]
[im 1/160]
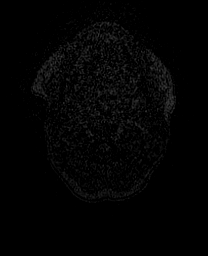
[im 18/160]
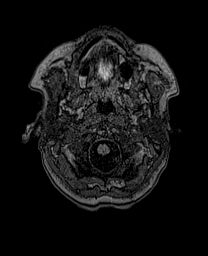
[im 36/160]
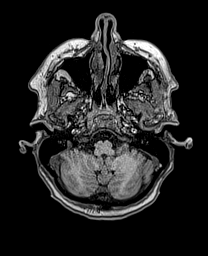
[im 54/160]
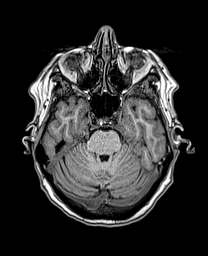
[im 71/160]
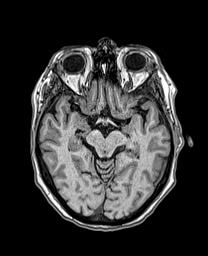
[im 89/160]
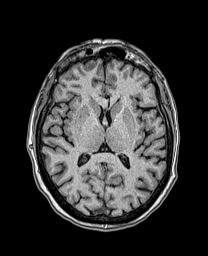
[im 107/160]
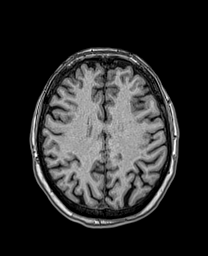
[im 124/160]
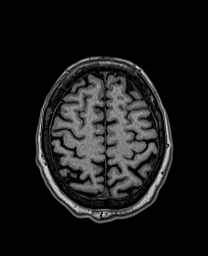
[im 142/160]
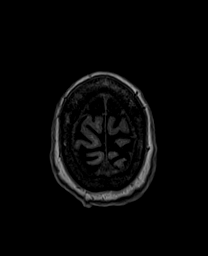
[im 160/160]
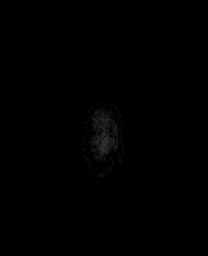

[Series 13: T2 · coronal · 5.0mm · 0.72mm/px · 2 of 30 slices shown (2 of 2)]
[im 1/30]
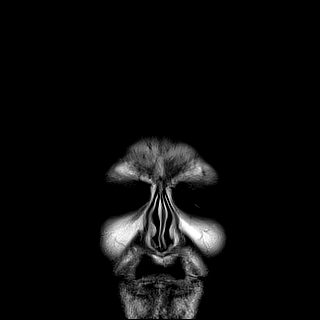
[im 30/30]
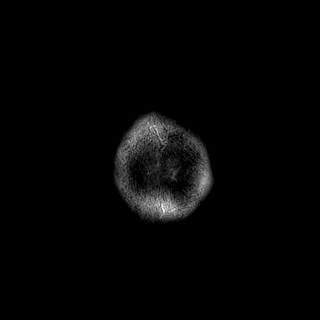

[Series 15: T1 post-contrast · coronal · 5.0mm · 0.34mm/px · 2 of 28 slices shown (1 of 2)]
[im 1/28]
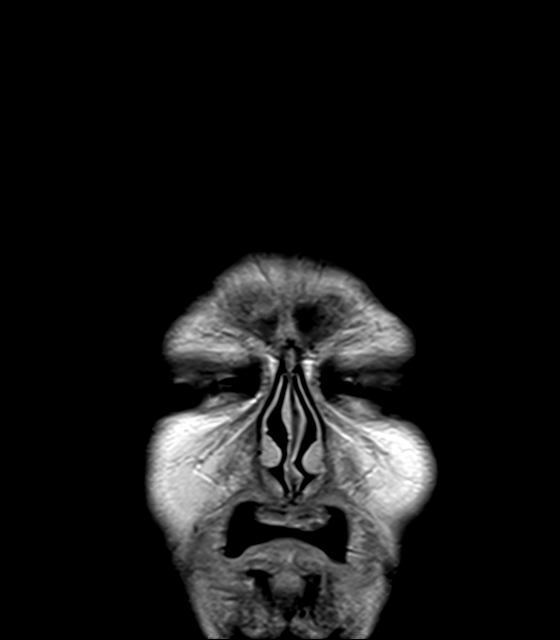
[im 28/28]
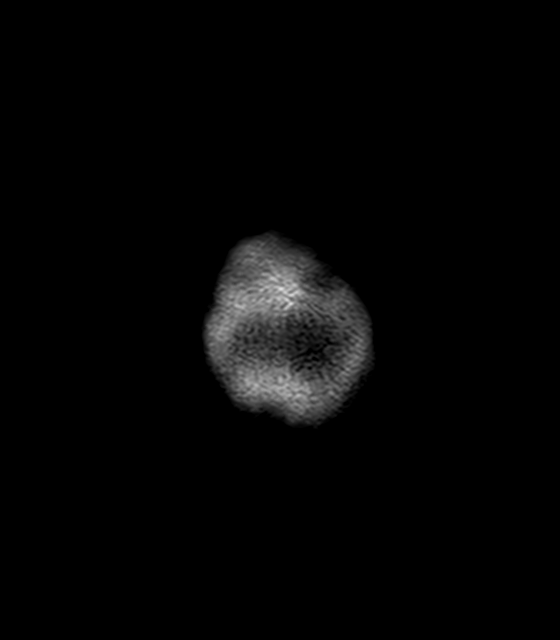

[Series 16: T1 post-contrast · sagittal · 5.0mm · 0.75mm/px · 1 of 21 slices shown (2 of 2)]
[im 1/21]
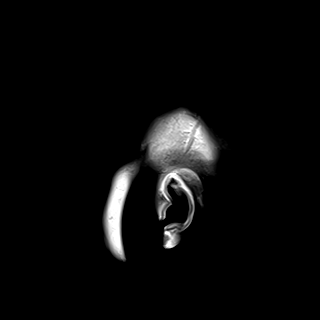

[38 of 48 positions shown; findings below may reference images not displayed]

FINDINGS: MRI HEAD FINDINGS

Brain: There is no evidence of an acute infarct, intracranial
hemorrhage, mass, midline shift, or extra-axial fluid collection.
The ventricles and sulci are normal. Scattered small foci of T2
FLAIR hyperintensity in the subcortical greater than periventricular
white matter bilaterally have slightly progressed from 4052. No
abnormal enhancement is identified. The brainstem and cerebellum are
normal in signal.

Vascular: Major intracranial vascular flow voids are preserved.

Skull and upper cervical spine: Unremarkable bone marrow signal.

Sinuses/Orbits: Unremarkable orbits. Paranasal sinuses and mastoid
air cells are clear.

Other: None.

MRA HEAD FINDINGS

The intracranial vertebral arteries are widely patent to the
basilar. Patent PICA and SCA origins are identified bilaterally. The
basilar artery is widely patent. There is a moderately large right
posterior communicating artery with hypoplastic right P1 segment.
Both PCAs are patent without evidence of a significant proximal
stenosis.

The internal carotid arteries are widely patent from skull base to
carotid termini. The 2 mm right cavernous ICA aneurysm described on
the prior CTA is not clearly demonstrated on this MRA. ACAs and MCAs
are patent without evidence of a proximal branch occlusion or
significant proximal stenosis.

MRA NECK FINDINGS

There is a standard 3 vessel aortic arch. The brachiocephalic and
subclavian arteries are widely patent. The common carotid and
cervical internal carotid arteries are patent without evidence of
stenosis or dissection.

The vertebral arteries are patent and codominant with antegrade flow
bilaterally. There is no evidence of a vertebral artery dissection
or stenosis.
IMPRESSION: 1. No acute intracranial abnormality.
2. Minimal cerebral white matter T2 signal changes, slightly
progressed from 4052 and nonspecific with considerations including
chronic small vessel ischemia, migraines, and prior
infection/inflammation. No specific findings to suggest
demyelinating disease.
3. Negative head MRA.
4. Negative neck MRA.

## 2021-07-29 DIAGNOSIS — F419 Anxiety disorder, unspecified: Secondary | ICD-10-CM | POA: Diagnosis not present

## 2021-07-29 DIAGNOSIS — I10 Essential (primary) hypertension: Secondary | ICD-10-CM | POA: Diagnosis not present

## 2021-07-29 DIAGNOSIS — Z23 Encounter for immunization: Secondary | ICD-10-CM | POA: Diagnosis not present

## 2021-07-29 DIAGNOSIS — E663 Overweight: Secondary | ICD-10-CM | POA: Diagnosis not present

## 2021-07-29 DIAGNOSIS — Z6827 Body mass index (BMI) 27.0-27.9, adult: Secondary | ICD-10-CM | POA: Diagnosis not present

## 2021-07-29 DIAGNOSIS — G894 Chronic pain syndrome: Secondary | ICD-10-CM | POA: Diagnosis not present

## 2021-07-29 DIAGNOSIS — E063 Autoimmune thyroiditis: Secondary | ICD-10-CM | POA: Diagnosis not present

## 2021-07-29 DIAGNOSIS — R69 Illness, unspecified: Secondary | ICD-10-CM | POA: Diagnosis not present

## 2021-08-07 ENCOUNTER — Ambulatory Visit (HOSPITAL_COMMUNITY)
Admission: RE | Admit: 2021-08-07 | Discharge: 2021-08-07 | Disposition: A | Discharge status: Home / Self Care | Payer: Medicare HMO | Source: Ambulatory Visit | Attending: Neurology | Admitting: Neurology

## 2021-08-07 ENCOUNTER — Other Ambulatory Visit: Payer: Self-pay

## 2021-08-07 DIAGNOSIS — I700 Atherosclerosis of aorta: Secondary | ICD-10-CM | POA: Insufficient documentation

## 2021-08-07 DIAGNOSIS — F17200 Nicotine dependence, unspecified, uncomplicated: Secondary | ICD-10-CM

## 2021-08-07 DIAGNOSIS — J439 Emphysema, unspecified: Secondary | ICD-10-CM | POA: Diagnosis not present

## 2021-08-07 DIAGNOSIS — Z122 Encounter for screening for malignant neoplasm of respiratory organs: Secondary | ICD-10-CM | POA: Insufficient documentation

## 2021-08-07 DIAGNOSIS — Z87891 Personal history of nicotine dependence: Secondary | ICD-10-CM | POA: Insufficient documentation

## 2021-08-07 DIAGNOSIS — I2510 Atherosclerotic heart disease of native coronary artery without angina pectoris: Secondary | ICD-10-CM | POA: Insufficient documentation

## 2021-08-07 DIAGNOSIS — F17209 Nicotine dependence, unspecified, with unspecified nicotine-induced disorders: Secondary | ICD-10-CM

## 2021-08-11 ENCOUNTER — Other Ambulatory Visit (HOSPITAL_COMMUNITY): Payer: Self-pay | Admitting: Neurology

## 2021-08-11 ENCOUNTER — Other Ambulatory Visit: Payer: Self-pay | Admitting: Neurology

## 2021-08-11 DIAGNOSIS — I824Y3 Acute embolism and thrombosis of unspecified deep veins of proximal lower extremity, bilateral: Secondary | ICD-10-CM

## 2021-08-11 DIAGNOSIS — G40009 Localization-related (focal) (partial) idiopathic epilepsy and epileptic syndromes with seizures of localized onset, not intractable, without status epilepticus: Secondary | ICD-10-CM | POA: Diagnosis not present

## 2021-08-11 DIAGNOSIS — M5416 Radiculopathy, lumbar region: Secondary | ICD-10-CM | POA: Diagnosis not present

## 2021-08-11 DIAGNOSIS — I6930 Unspecified sequelae of cerebral infarction: Secondary | ICD-10-CM | POA: Diagnosis not present

## 2021-08-11 DIAGNOSIS — I82409 Acute embolism and thrombosis of unspecified deep veins of unspecified lower extremity: Secondary | ICD-10-CM | POA: Diagnosis not present

## 2021-08-11 DIAGNOSIS — M545 Low back pain, unspecified: Secondary | ICD-10-CM | POA: Diagnosis not present

## 2021-08-11 DIAGNOSIS — G5601 Carpal tunnel syndrome, right upper limb: Secondary | ICD-10-CM | POA: Diagnosis not present

## 2021-08-11 DIAGNOSIS — R251 Tremor, unspecified: Secondary | ICD-10-CM | POA: Diagnosis not present

## 2021-08-11 DIAGNOSIS — Z79891 Long term (current) use of opiate analgesic: Secondary | ICD-10-CM | POA: Diagnosis not present

## 2021-08-11 DIAGNOSIS — M542 Cervicalgia: Secondary | ICD-10-CM | POA: Diagnosis not present

## 2021-08-11 DIAGNOSIS — R252 Cramp and spasm: Secondary | ICD-10-CM | POA: Diagnosis not present

## 2021-08-13 IMAGING — XA Imaging study
2 series · 2 of 2 positions shown · non-contrast
Comparison: none

CLINICAL DATA: 54-year-old female with lumbosacral spondylosis
without myelopathy. She has a history of displaced lumbar discs at
L4-L5 and L5-S1. Prior caudal epidural injections were successful at
relieving her symptoms by approximately 50% after the first
injection and 75% after the second injection. She was doing well in
till a recent fall when her symptoms became exacerbated. She now
presents for her third caudal epidural steroid injection.

[Series 1: ortho adipose · 1 of 1 slices shown (1 of 2)]
[im 1/1]
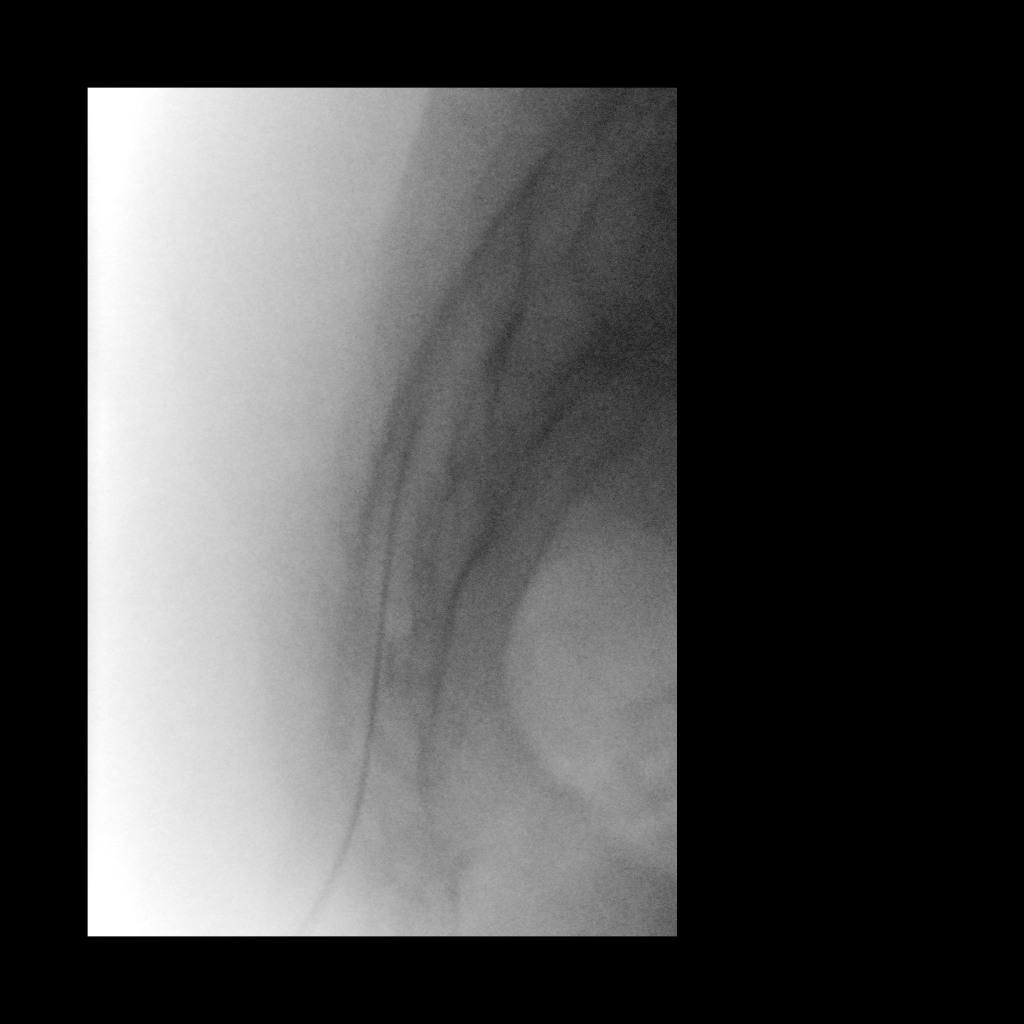

[Series 2: ortho adipose · 1 of 1 slices shown (2 of 2)]
[im 1/1]
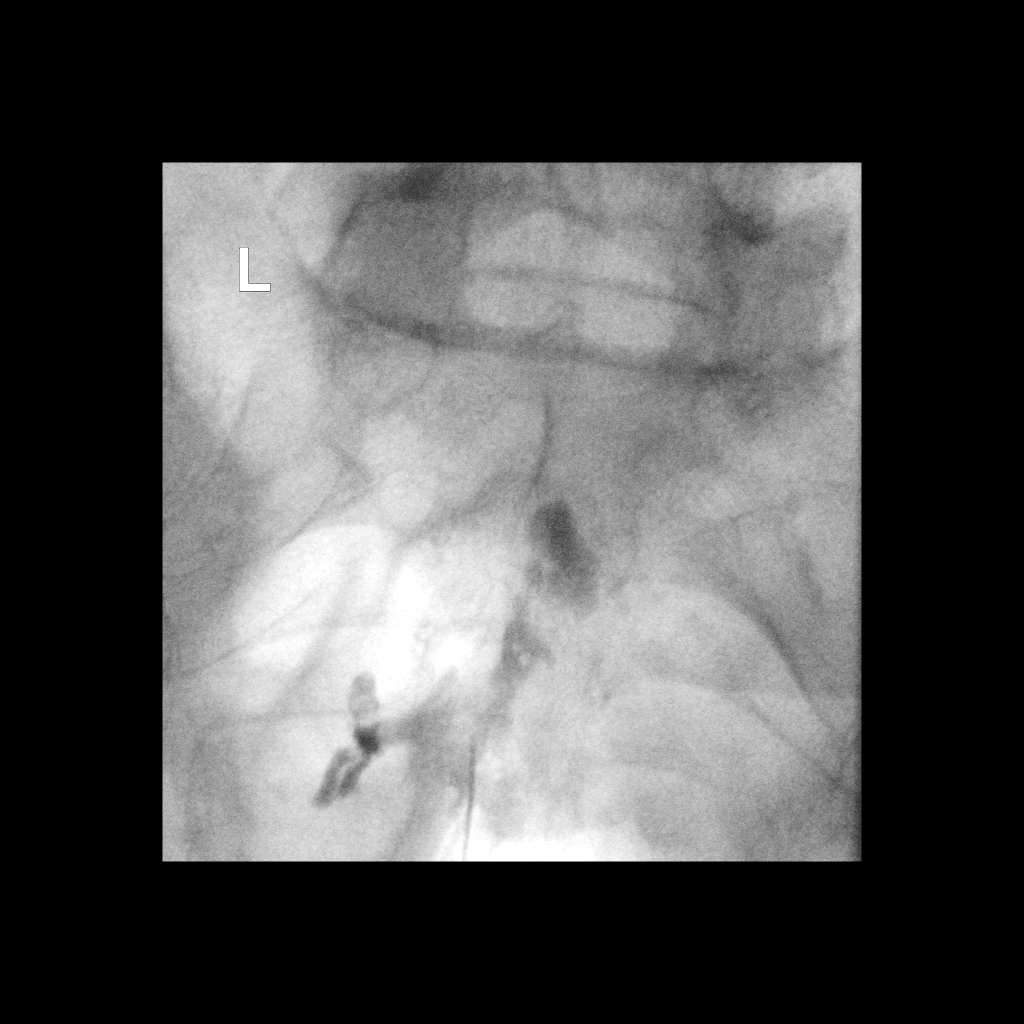

[2 of 2 positions shown; findings below may reference images not displayed]

FLUOROSCOPY TIME:  0 minutes 46 seconds 9.7 mGy

EXAM:
CAUDAL EPIDURAL INJECTION

Utilizing a caudal approach, the skin overlying the sacral hiatus
was cleansed and anesthetized. A 20 gauge epidural needle was
advanced into the sacral epidural space. Injection of Isovue-M 200
shows a good epidural pattern with spread up to L5-S1. No vascular
opacification is seen.

120 mg of Depo-Medrol mixed with 3 ml of normal saline and 3 ml of
1% Lidocaine were instilled. The procedure was well-tolerated, and
the patient was discharged thirty minutes following the injection in
good condition.
IMPRESSION: Technically successful caudal epidural injection #3.

## 2021-08-14 ENCOUNTER — Ambulatory Visit (HOSPITAL_COMMUNITY)
Admission: RE | Admit: 2021-08-14 | Discharge: 2021-08-14 | Disposition: A | Discharge status: Home / Self Care | Payer: Medicare HMO | Source: Ambulatory Visit | Attending: Neurology | Admitting: Neurology

## 2021-08-14 ENCOUNTER — Other Ambulatory Visit: Payer: Self-pay

## 2021-08-14 DIAGNOSIS — M79661 Pain in right lower leg: Secondary | ICD-10-CM | POA: Diagnosis not present

## 2021-08-14 DIAGNOSIS — I824Y3 Acute embolism and thrombosis of unspecified deep veins of proximal lower extremity, bilateral: Secondary | ICD-10-CM | POA: Insufficient documentation

## 2021-08-14 DIAGNOSIS — M7989 Other specified soft tissue disorders: Secondary | ICD-10-CM | POA: Diagnosis not present

## 2021-08-21 ENCOUNTER — Other Ambulatory Visit: Payer: Self-pay | Admitting: Neurology

## 2021-08-21 ENCOUNTER — Other Ambulatory Visit (HOSPITAL_COMMUNITY): Payer: Self-pay | Admitting: Neurology

## 2021-08-21 DIAGNOSIS — D499 Neoplasm of unspecified behavior of unspecified site: Secondary | ICD-10-CM

## 2021-08-27 DIAGNOSIS — J329 Chronic sinusitis, unspecified: Secondary | ICD-10-CM | POA: Diagnosis not present

## 2021-08-27 DIAGNOSIS — R519 Headache, unspecified: Secondary | ICD-10-CM | POA: Diagnosis not present

## 2021-08-27 DIAGNOSIS — M545 Low back pain, unspecified: Secondary | ICD-10-CM | POA: Diagnosis not present

## 2021-09-03 DIAGNOSIS — U071 COVID-19: Secondary | ICD-10-CM | POA: Diagnosis not present

## 2021-09-08 ENCOUNTER — Encounter (HOSPITAL_COMMUNITY): Payer: Self-pay

## 2021-09-08 ENCOUNTER — Ambulatory Visit (HOSPITAL_COMMUNITY): Admission: RE | Admit: 2021-09-08 | Payer: Commercial Managed Care - HMO | Source: Ambulatory Visit

## 2021-09-13 DIAGNOSIS — R531 Weakness: Secondary | ICD-10-CM | POA: Diagnosis not present

## 2021-09-14 ENCOUNTER — Ambulatory Visit (HOSPITAL_COMMUNITY)
Admission: RE | Admit: 2021-09-14 | Discharge: 2021-09-14 | Disposition: A | Discharge status: Home / Self Care | Payer: Medicare Other | Source: Ambulatory Visit | Attending: Internal Medicine | Admitting: Internal Medicine

## 2021-09-14 ENCOUNTER — Other Ambulatory Visit (HOSPITAL_COMMUNITY): Payer: Self-pay | Admitting: Internal Medicine

## 2021-09-14 ENCOUNTER — Other Ambulatory Visit: Payer: Self-pay

## 2021-09-14 DIAGNOSIS — R059 Cough, unspecified: Secondary | ICD-10-CM

## 2021-09-14 DIAGNOSIS — R519 Headache, unspecified: Secondary | ICD-10-CM | POA: Diagnosis not present

## 2021-09-14 DIAGNOSIS — E063 Autoimmune thyroiditis: Secondary | ICD-10-CM | POA: Diagnosis not present

## 2021-09-14 DIAGNOSIS — U071 COVID-19: Secondary | ICD-10-CM | POA: Diagnosis not present

## 2021-09-14 DIAGNOSIS — M545 Low back pain, unspecified: Secondary | ICD-10-CM | POA: Diagnosis not present

## 2021-09-14 DIAGNOSIS — M25569 Pain in unspecified knee: Secondary | ICD-10-CM | POA: Diagnosis not present

## 2021-09-14 DIAGNOSIS — S8991XA Unspecified injury of right lower leg, initial encounter: Secondary | ICD-10-CM | POA: Diagnosis not present

## 2021-09-14 DIAGNOSIS — J329 Chronic sinusitis, unspecified: Secondary | ICD-10-CM | POA: Diagnosis not present

## 2021-09-14 DIAGNOSIS — S6992XA Unspecified injury of left wrist, hand and finger(s), initial encounter: Secondary | ICD-10-CM | POA: Diagnosis not present

## 2021-09-14 DIAGNOSIS — N390 Urinary tract infection, site not specified: Secondary | ICD-10-CM | POA: Diagnosis not present

## 2021-09-21 ENCOUNTER — Other Ambulatory Visit: Payer: Self-pay

## 2021-09-21 ENCOUNTER — Ambulatory Visit (HOSPITAL_COMMUNITY)
Admission: RE | Admit: 2021-09-21 | Discharge: 2021-09-21 | Disposition: A | Discharge status: Home / Self Care | Payer: Medicare Other | Source: Ambulatory Visit | Attending: Neurology | Admitting: Neurology

## 2021-09-21 DIAGNOSIS — D499 Neoplasm of unspecified behavior of unspecified site: Secondary | ICD-10-CM | POA: Diagnosis not present

## 2021-09-21 DIAGNOSIS — R109 Unspecified abdominal pain: Secondary | ICD-10-CM | POA: Diagnosis not present

## 2021-09-21 DIAGNOSIS — R1031 Right lower quadrant pain: Secondary | ICD-10-CM | POA: Diagnosis not present

## 2021-09-23 IMAGING — MG MM DIGITAL SCREENING BILAT W/ TOMO AND CAD
6 of 10 series · 6 of 30 positions shown · non-contrast
Comparison: Previous exam(s).

CLINICAL DATA: Screening.

EXAM:
DIGITAL SCREENING BILATERAL MAMMOGRAM WITH TOMOSYNTHESIS AND CAD
TECHNIQUE: Bilateral screening digital craniocaudal and mediolateral oblique
mammograms were obtained. Bilateral screening digital breast
tomosynthesis was performed. The images were evaluated with
computer-aided detection.

[R MLO synth-2D (1 of 2)]
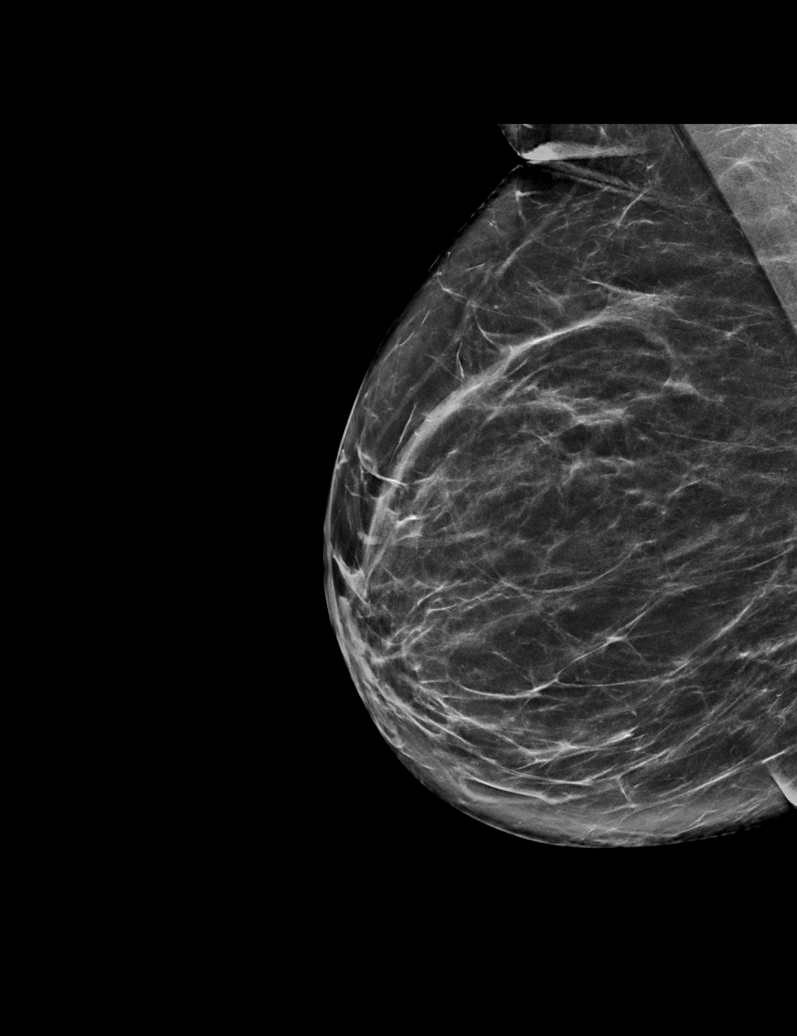

[R CC synth-2D]
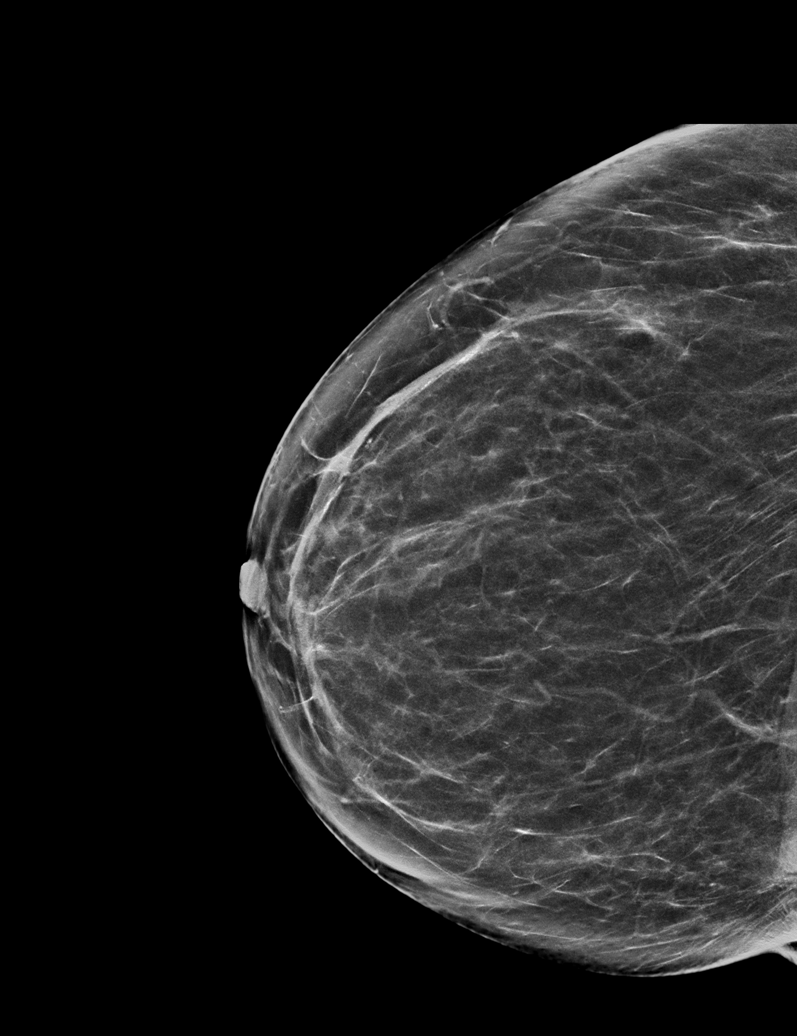

[L CC synth-2D]
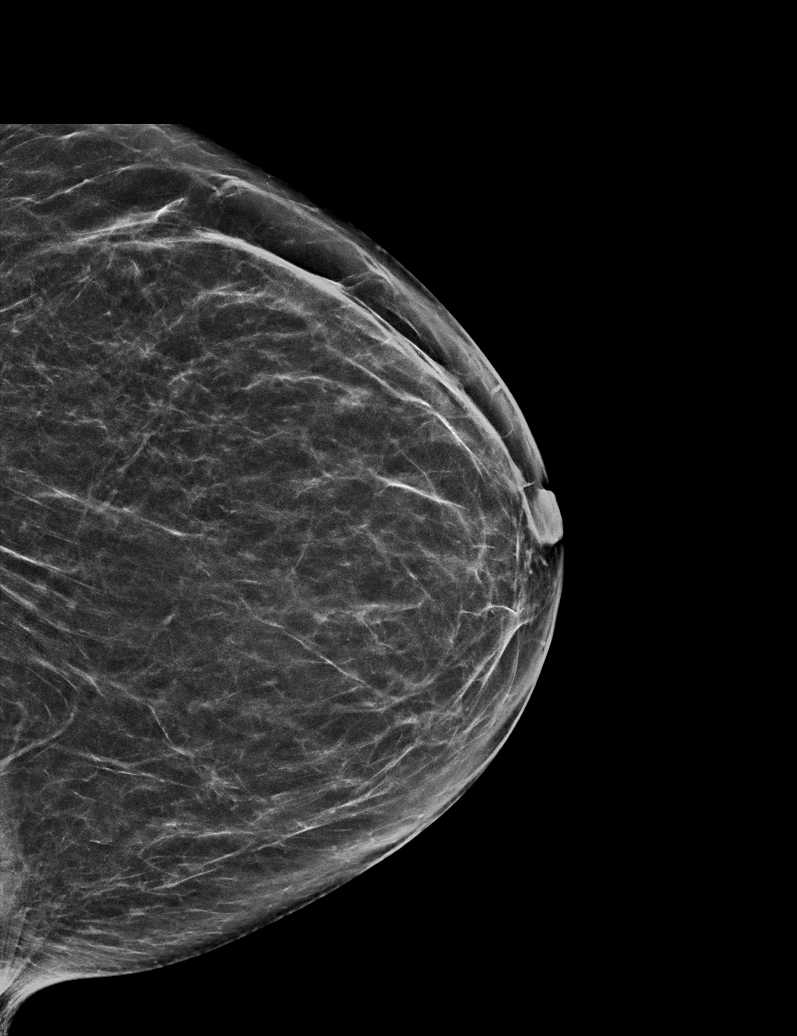

[R MLO synth-2D (2 of 2)]
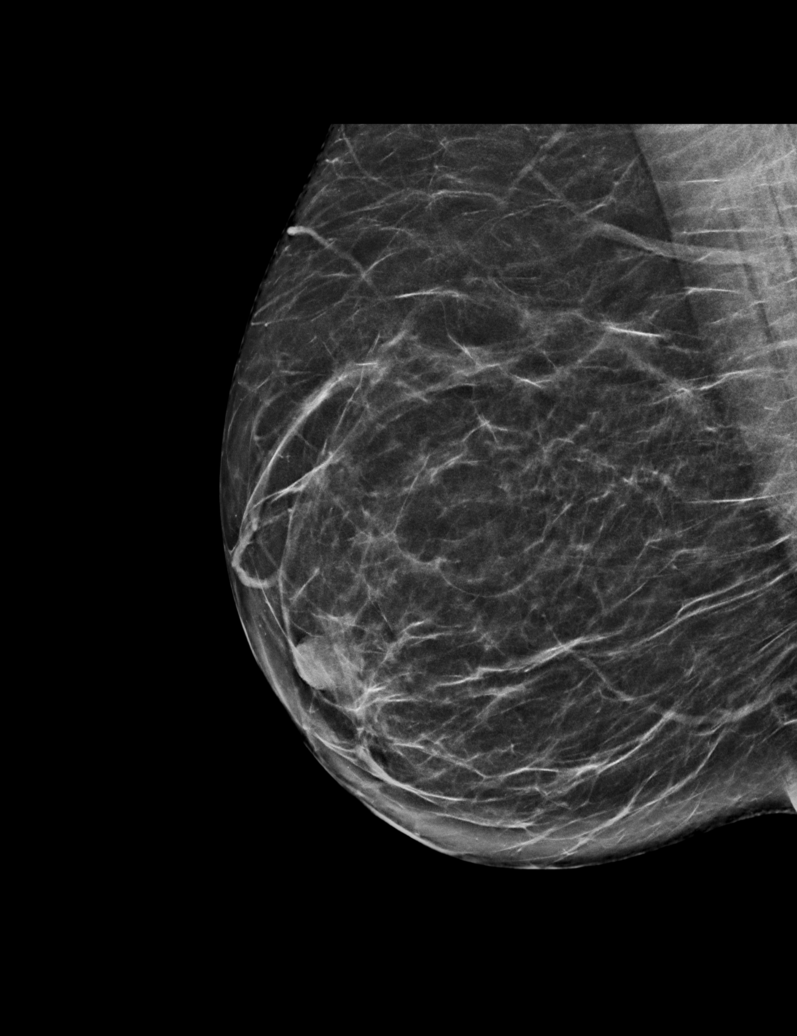

[L MLO synth-2D]
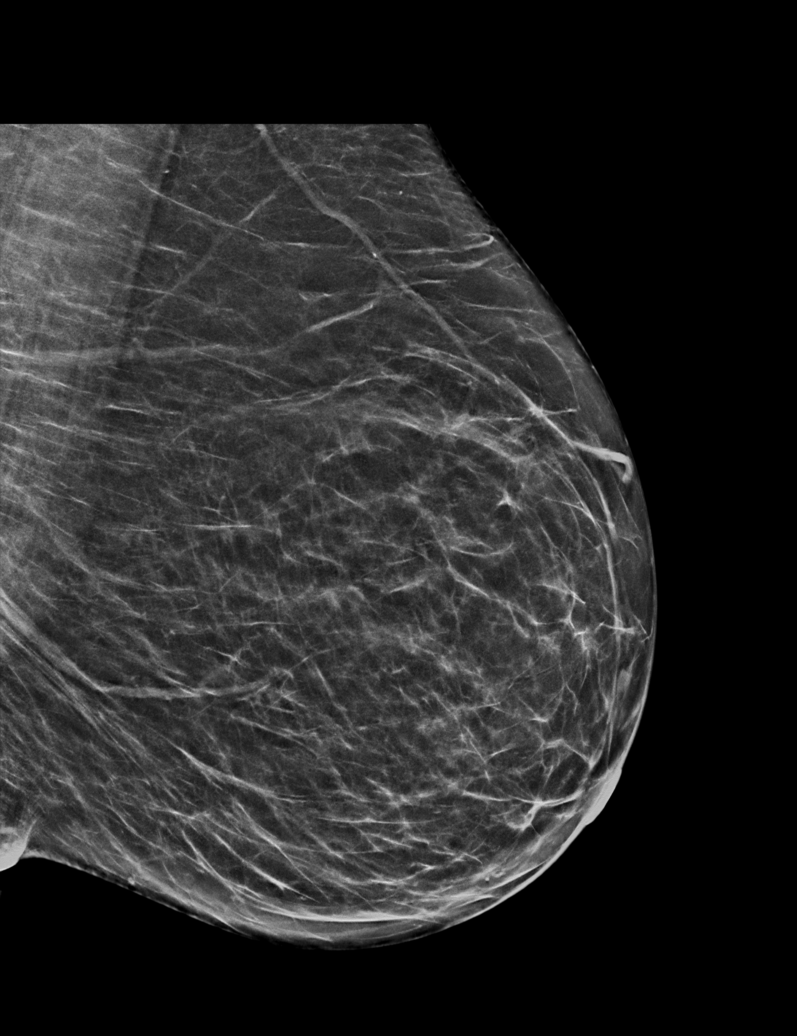

[L MLO tomo · tomo slice 29/58.0]
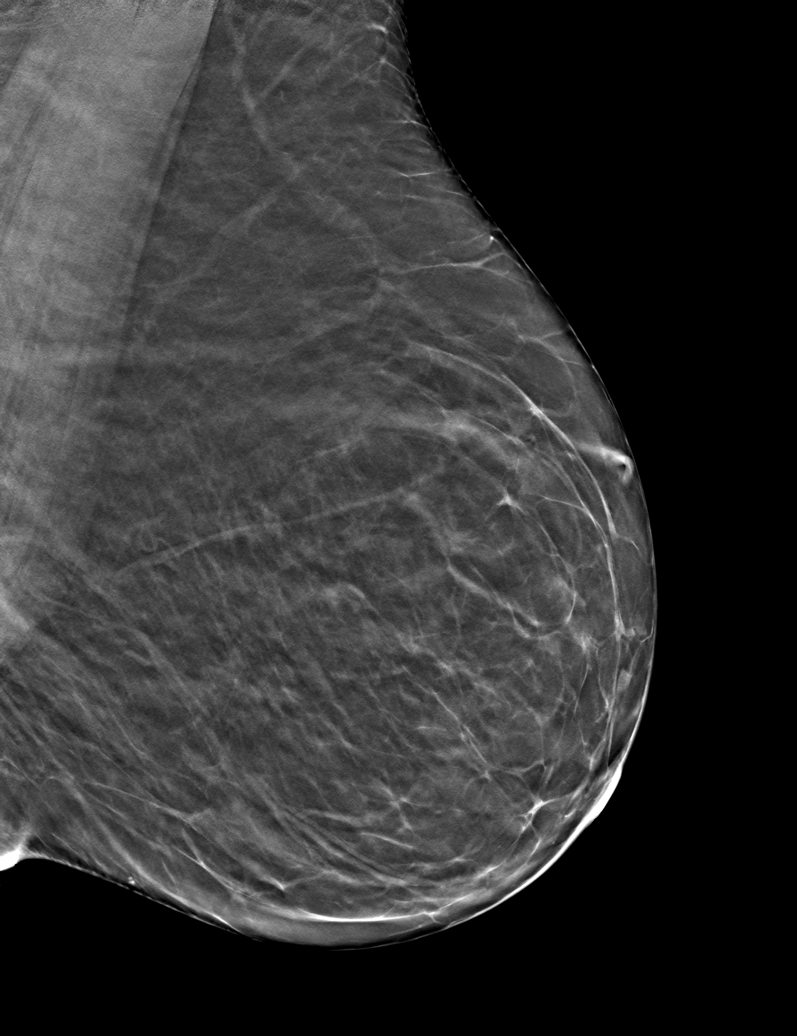

[6 of 30 positions shown; findings below may reference images not displayed]

ACR Breast Density Category b: There are scattered areas of
fibroglandular density.
FINDINGS: There are no findings suspicious for malignancy. The images were
evaluated with computer-aided detection.
IMPRESSION: No mammographic evidence of malignancy. A result letter of this
screening mammogram will be mailed directly to the patient.

RECOMMENDATION:
Screening mammogram in one year. (Code:[id number])

BI-RADS CATEGORY  1: Negative.

## 2021-09-25 DIAGNOSIS — M545 Low back pain, unspecified: Secondary | ICD-10-CM | POA: Diagnosis not present

## 2021-09-25 DIAGNOSIS — G894 Chronic pain syndrome: Secondary | ICD-10-CM | POA: Diagnosis not present

## 2021-09-25 DIAGNOSIS — R059 Cough, unspecified: Secondary | ICD-10-CM | POA: Diagnosis not present

## 2021-09-30 DIAGNOSIS — R109 Unspecified abdominal pain: Secondary | ICD-10-CM | POA: Diagnosis not present

## 2021-09-30 DIAGNOSIS — G603 Idiopathic progressive neuropathy: Secondary | ICD-10-CM | POA: Diagnosis not present

## 2021-09-30 DIAGNOSIS — Z79891 Long term (current) use of opiate analgesic: Secondary | ICD-10-CM | POA: Diagnosis not present

## 2021-09-30 DIAGNOSIS — R252 Cramp and spasm: Secondary | ICD-10-CM | POA: Diagnosis not present

## 2021-09-30 DIAGNOSIS — R251 Tremor, unspecified: Secondary | ICD-10-CM | POA: Diagnosis not present

## 2021-09-30 DIAGNOSIS — M5416 Radiculopathy, lumbar region: Secondary | ICD-10-CM | POA: Diagnosis not present

## 2021-10-06 IMAGING — CT CT CERVICAL SPINE W/O CM
4 of 6 series · 13 of 33 positions shown, 15 images · non-contrast
Comparison: None.

CLINICAL DATA: Neuro deficit. Acute stroke suspected. Dizziness and
weakness. Ten falls yesterday. Pain.

EXAM:
CT HEAD WITHOUT CONTRAST
CT CERVICAL SPINE WITHOUT CONTRAST
TECHNIQUE: Multidetector CT imaging of the head and cervical spine was
performed following the standard protocol without intravenous
contrast. Multiplanar CT image reconstructions of the cervical spine
were also generated.

[Series 7: c spine soft · axial · 0.33mm/px · z∈[-104,-60]mm · 2 of 89 slices shown]
[im 23/89  soft-tissue]
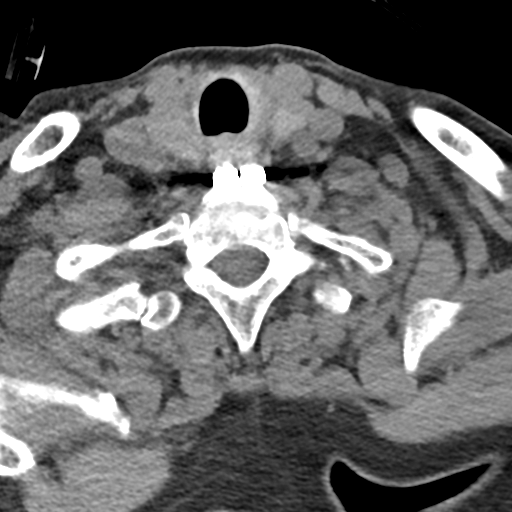
[im 45/89  soft-tissue]
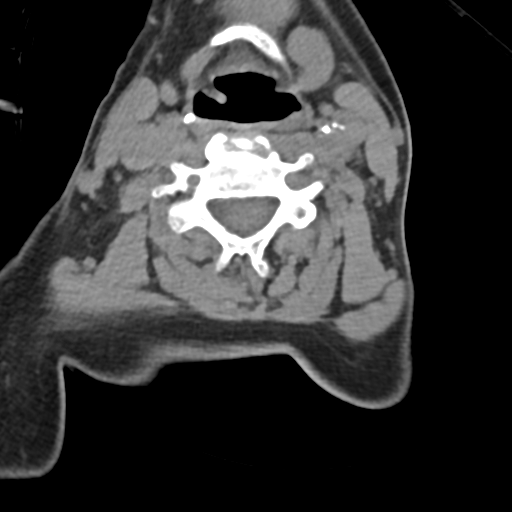

[Series 8: sagittal bone · sagittal · 0.30mm/px · 5 of 61 slices shown, 6 images]
[im 21/61  bone]
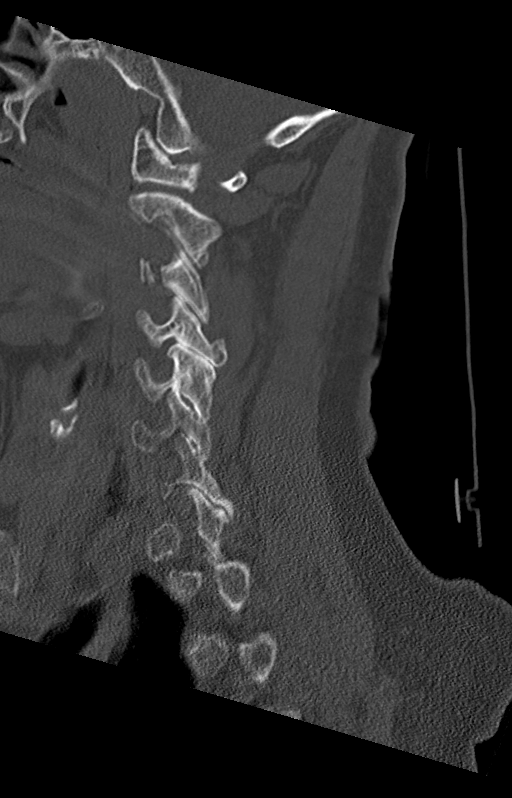
[im 26/61  bone]
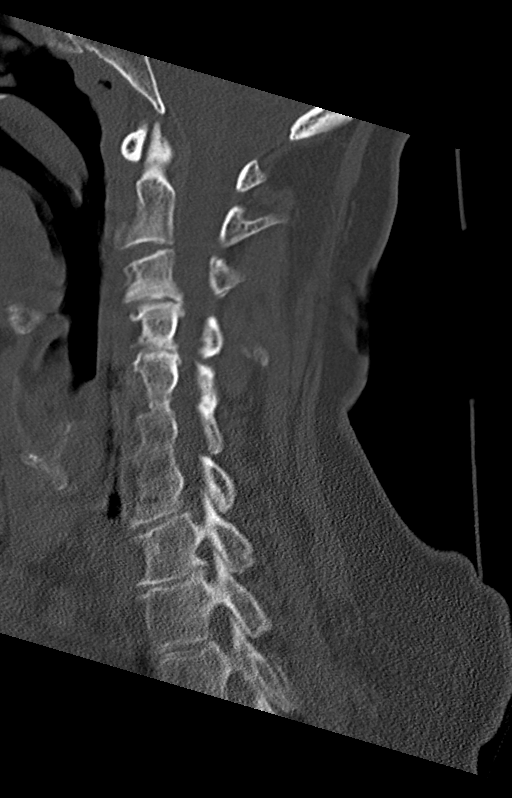
[im 31/61  soft-tissue]
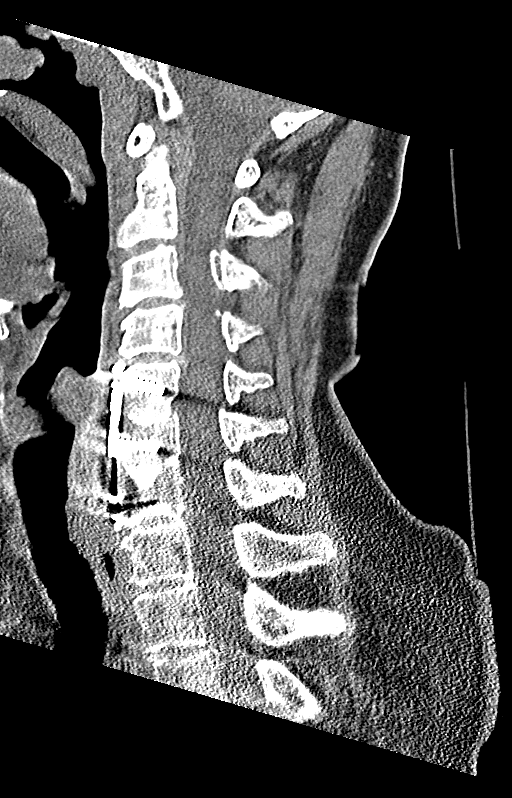
[im 31/61  bone]
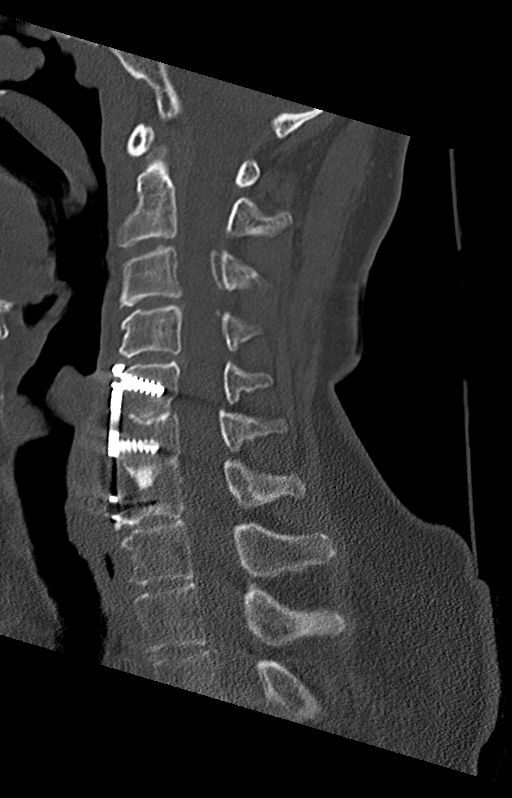
[im 36/61  bone]
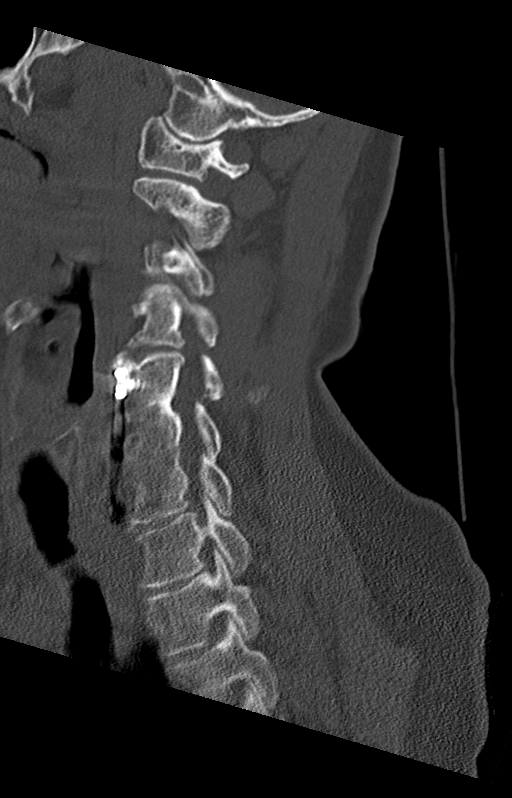
[im 41/61  bone]
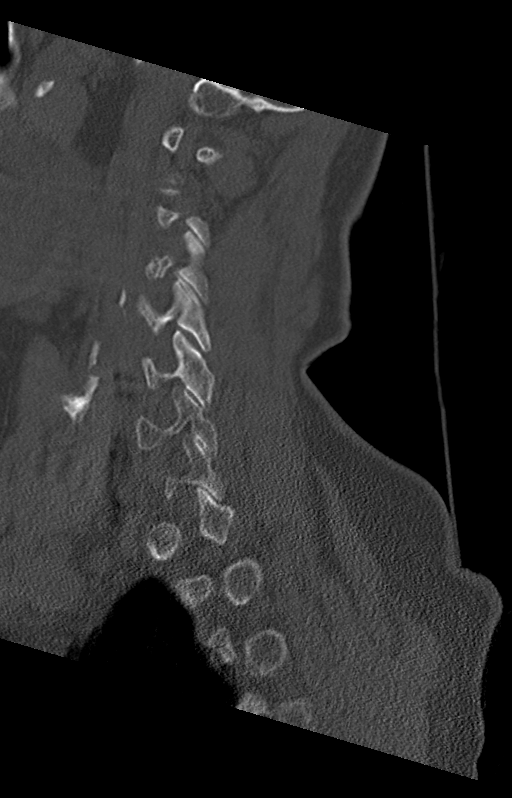

[Series 11: coronal bone · coronal · 0.23mm/px · 3 of 54 slices shown]
[im 11/54  bone]
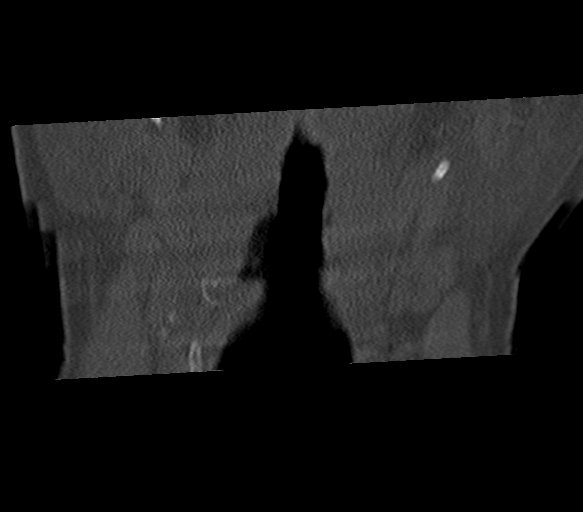
[im 22/54  bone]
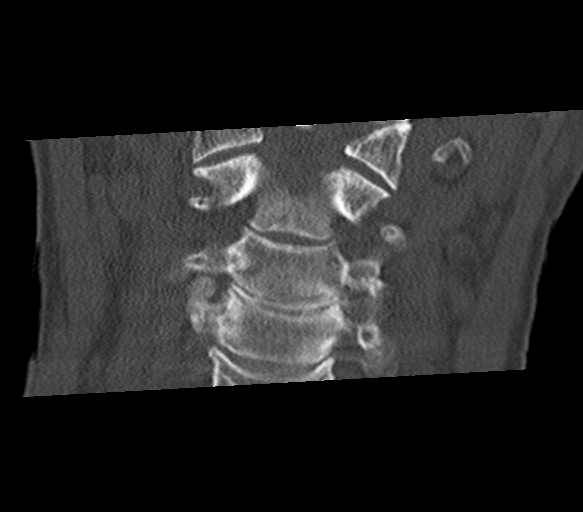
[im 32/54  bone]
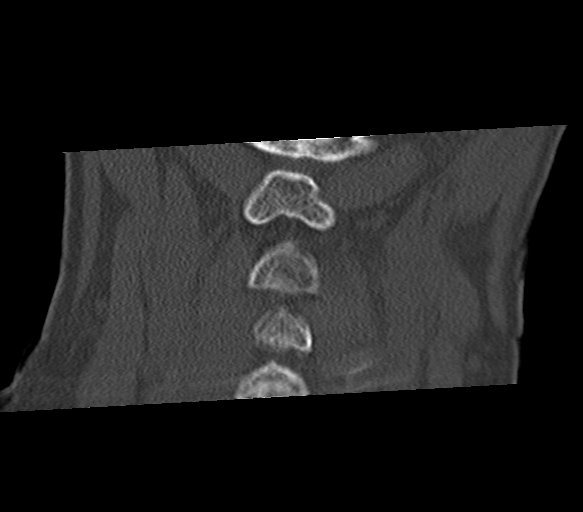

[Series 12: orthogonal axials · axial · 0.21mm/px · z∈[-120,-28]mm · 3 of 96 slices shown, 4 images]
[im 24/96  soft-tissue]
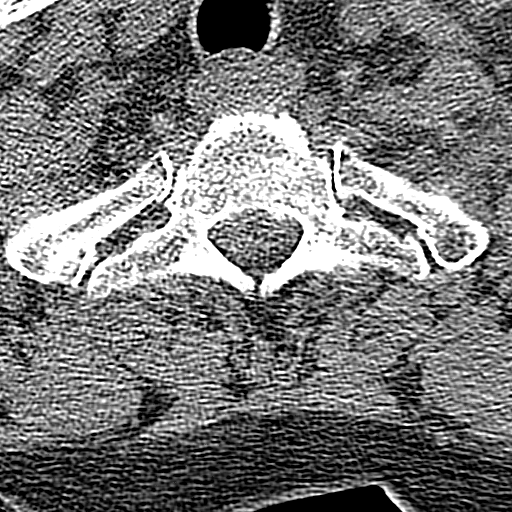
[im 24/96  bone]
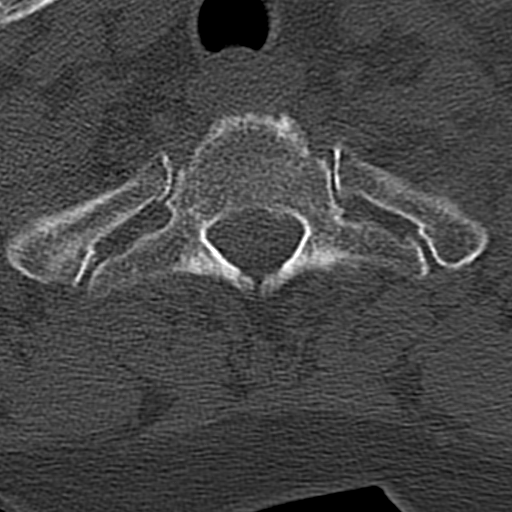
[im 48/96  bone]
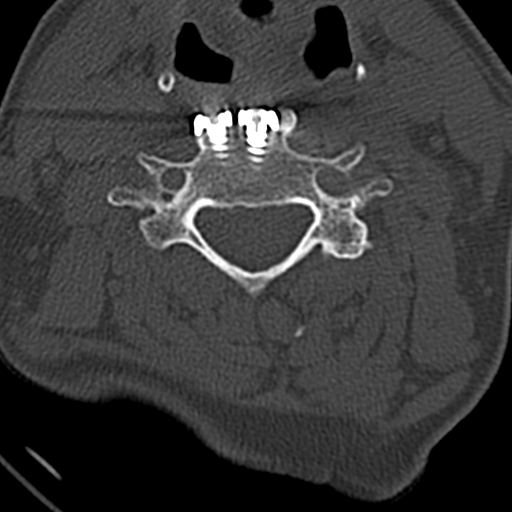
[im 72/96  bone]
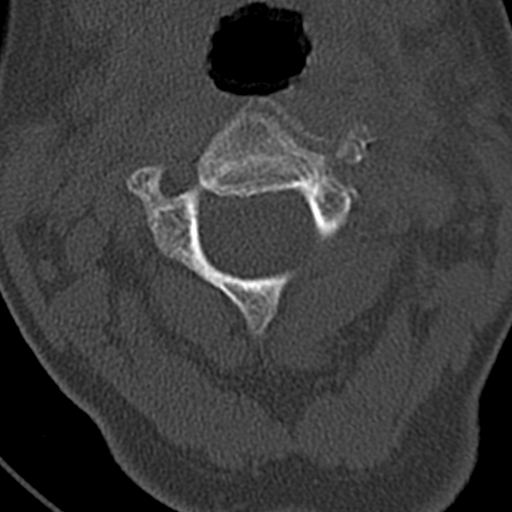

[13 of 33 positions shown; findings below may reference images not displayed]

FINDINGS: CT HEAD FINDINGS

Brain: No subdural, epidural, or subarachnoid hemorrhage.
Cerebellum, brainstem, and basal cisterns are normal. Ventricles and
sulci are unremarkable. No mass effect or midline shift. No acute
cortical ischemia or infarct.

Vascular: No hyperdense vessel or unexpected calcification.

Skull: Normal. Negative for fracture or focal lesion.

Sinuses/Orbits: No acute finding.

Other: None.

CT CERVICAL SPINE FINDINGS

Alignment: Evaluation is somewhat limited due to motion. Within the
limitations of motion, no traumatic malalignment is identified.

Skull base and vertebrae: No acute fracture. No primary bone lesion
or focal pathologic process.

Soft tissues and spinal canal: No prevertebral fluid or swelling. No
visible canal hematoma.

Disc levels: Patient is status post anterior plate screw fusion C5
through C7. Hardware is in good position. Mild degenerative disc
disease with small anterior and posterior osteophytes.

Upper chest: Negative.

Other: No other abnormalities.
IMPRESSION: 1. No acute intracranial abnormalities identified.
2. Evaluation of the cervical spine is mildly limited by motion.
Within these limitations, no fracture or traumatic malalignment
identified.

## 2021-10-06 IMAGING — CT CT HEAD W/O CM
4 series · 15 of 47 positions shown, 17 images · non-contrast
Comparison: None.

CLINICAL DATA: Neuro deficit. Acute stroke suspected. Dizziness and
weakness. Ten falls yesterday. Pain.

EXAM:
CT HEAD WITHOUT CONTRAST
CT CERVICAL SPINE WITHOUT CONTRAST
TECHNIQUE: Multidetector CT imaging of the head and cervical spine was
performed following the standard protocol without intravenous
contrast. Multiplanar CT image reconstructions of the cervical spine
were also generated.

[Series 2: head w o · axial · 0.43mm/px · z∈[+45,+165]mm · 7 of 32 slices shown, 9 images]
[im 4/32  brain]
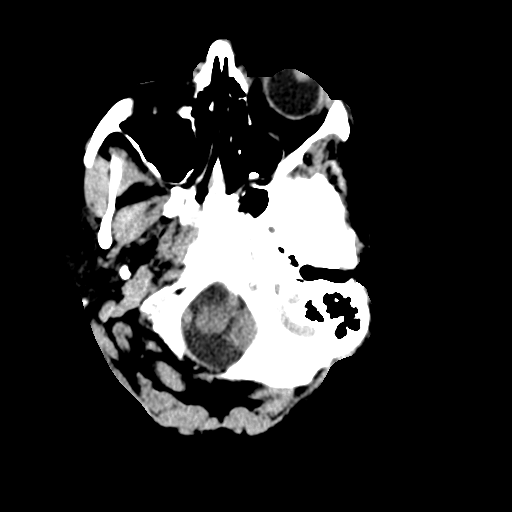
[im 4/32  bone]
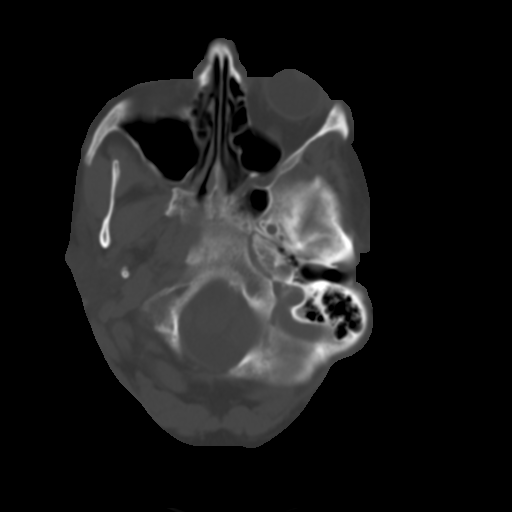
[im 8/32  brain]
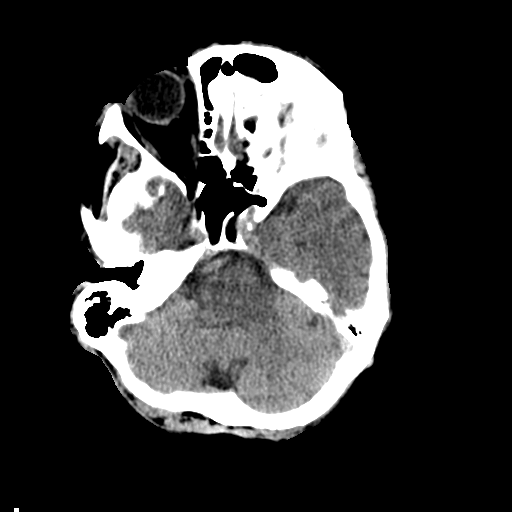
[im 12/32  brain]
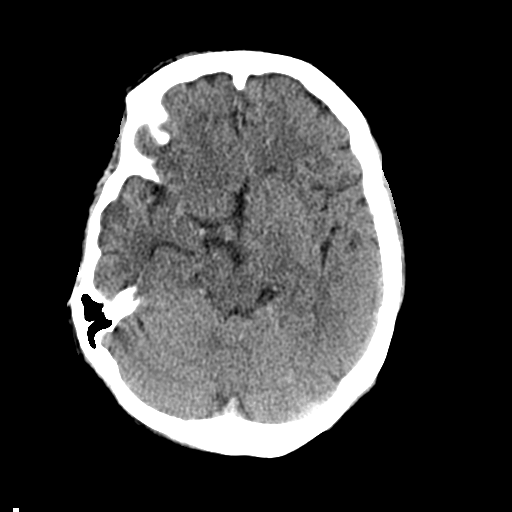
[im 16/32  brain]
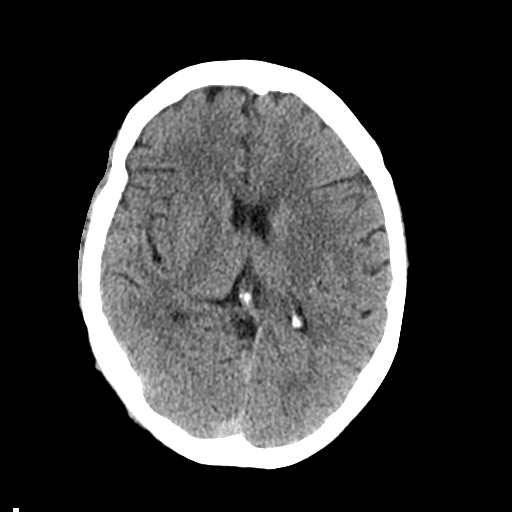
[im 20/32  brain]
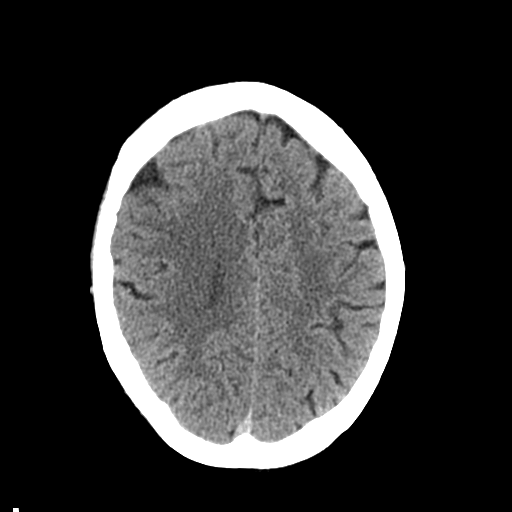
[im 20/32  bone]
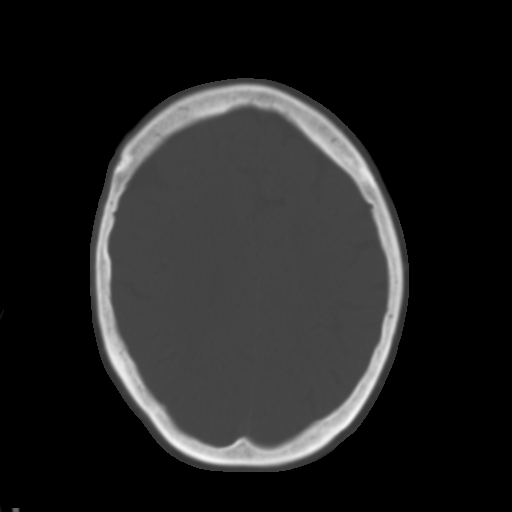
[im 24/32  brain]
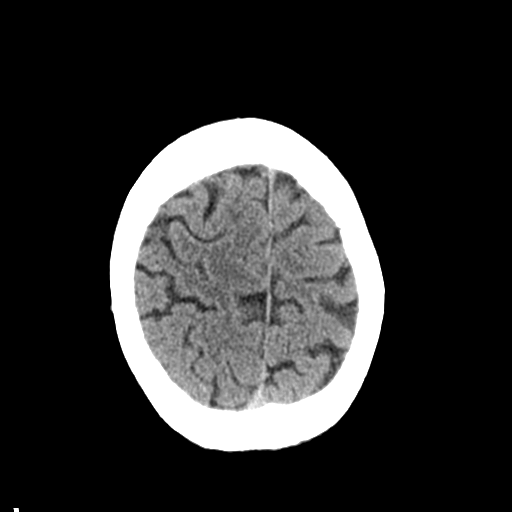
[im 28/32  brain]
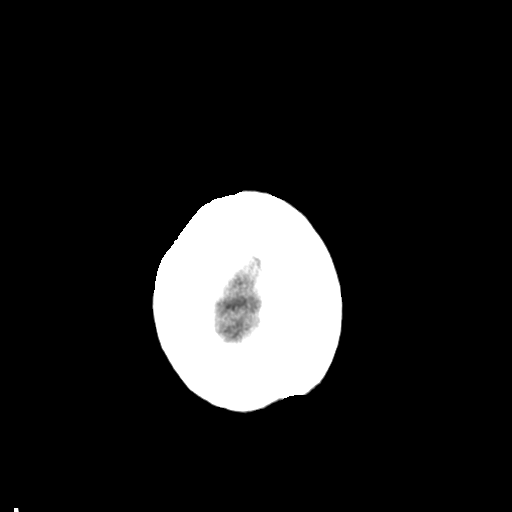

[Series 3: head bone · axial · 0.43mm/px · z∈[+44,+60]mm · 2 of 80 slices shown]
[im 8/80  bone]
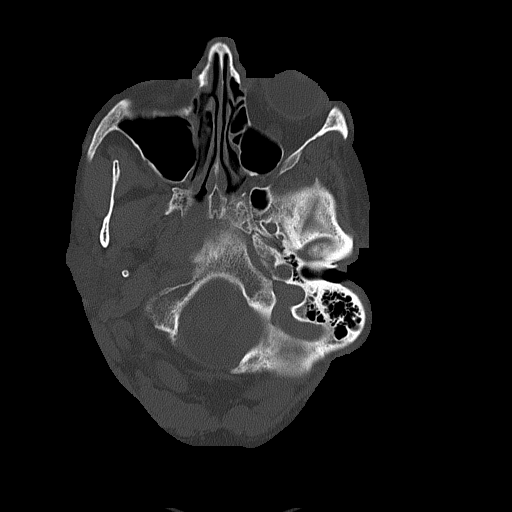
[im 16/80  bone]
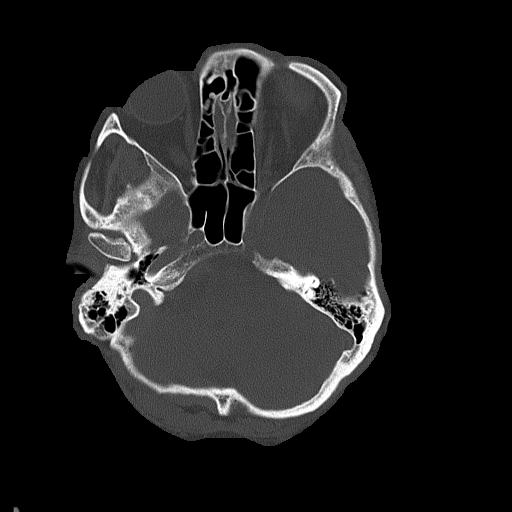

[Series 4: coronal soft · coronal · 0.31mm/px · 3 of 74 slices shown]
[im 25/74  brain]
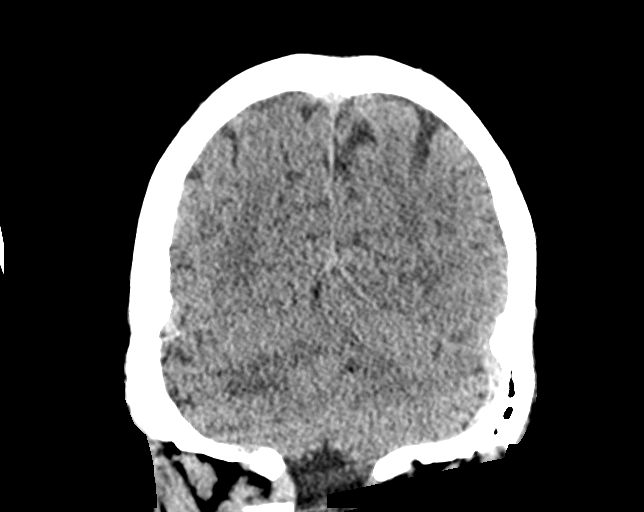
[im 33/74  brain]
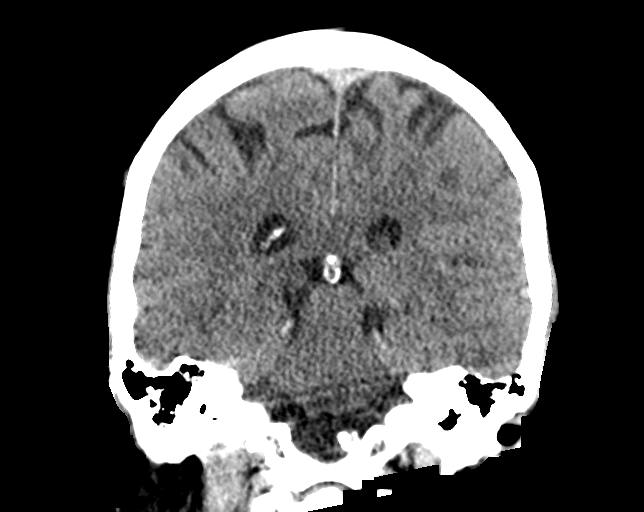
[im 41/74  brain]
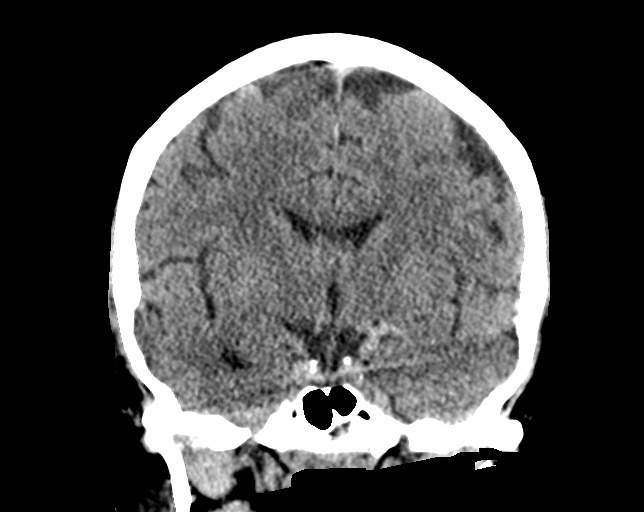

[Series 5: sagittal soft · sagittal · 0.32mm/px · 3 of 58 slices shown]
[im 25/58  brain]
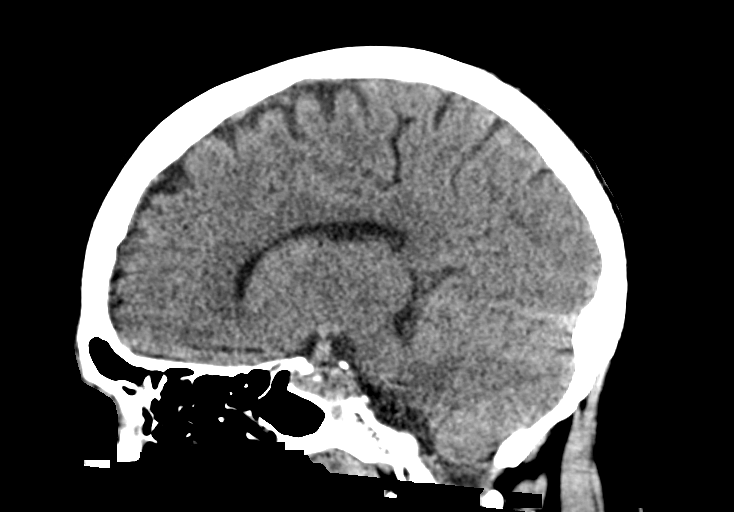
[im 30/58  brain]
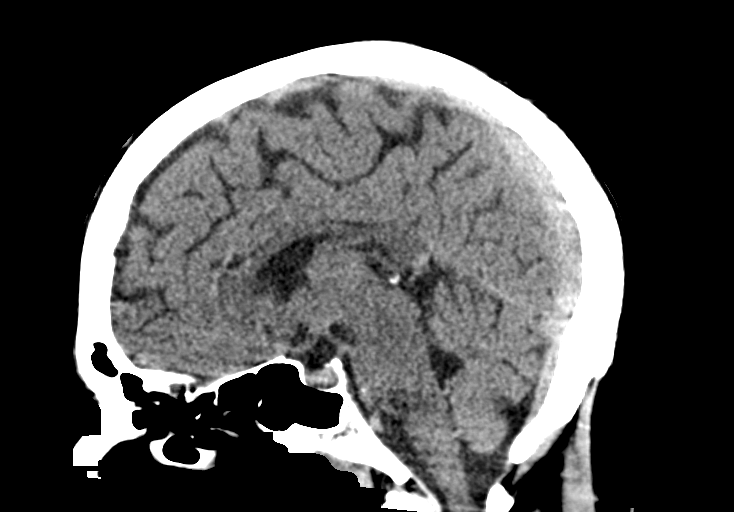
[im 34/58  brain]
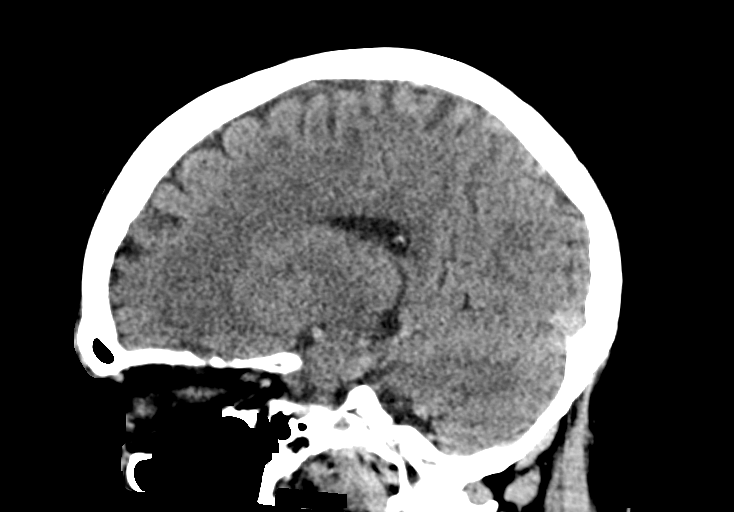

[15 of 47 positions shown; findings below may reference images not displayed]

FINDINGS: CT HEAD FINDINGS

Brain: No subdural, epidural, or subarachnoid hemorrhage.
Cerebellum, brainstem, and basal cisterns are normal. Ventricles and
sulci are unremarkable. No mass effect or midline shift. No acute
cortical ischemia or infarct.

Vascular: No hyperdense vessel or unexpected calcification.

Skull: Normal. Negative for fracture or focal lesion.

Sinuses/Orbits: No acute finding.

Other: None.

CT CERVICAL SPINE FINDINGS

Alignment: Evaluation is somewhat limited due to motion. Within the
limitations of motion, no traumatic malalignment is identified.

Skull base and vertebrae: No acute fracture. No primary bone lesion
or focal pathologic process.

Soft tissues and spinal canal: No prevertebral fluid or swelling. No
visible canal hematoma.

Disc levels: Patient is status post anterior plate screw fusion C5
through C7. Hardware is in good position. Mild degenerative disc
disease with small anterior and posterior osteophytes.

Upper chest: Negative.

Other: No other abnormalities.
IMPRESSION: 1. No acute intracranial abnormalities identified.
2. Evaluation of the cervical spine is mildly limited by motion.
Within these limitations, no fracture or traumatic malalignment
identified.

## 2021-10-06 IMAGING — CT CT ABD-PELV W/O CM
2 of 4 series · 16 of 46 positions shown, 18 images · non-contrast
Comparison: 10/24/2019

CLINICAL DATA: Fall.  Abdominal trauma.

EXAM:
CT ABDOMEN AND PELVIS WITHOUT CONTRAST
TECHNIQUE: Multidetector CT imaging of the abdomen and pelvis was performed
following the standard protocol without IV contrast.

[Series 3: axial st · axial · 0.86mm/px · z∈[-723,-288]mm · 13 of 99 slices shown, 15 images]
[im 6/99  soft-tissue]
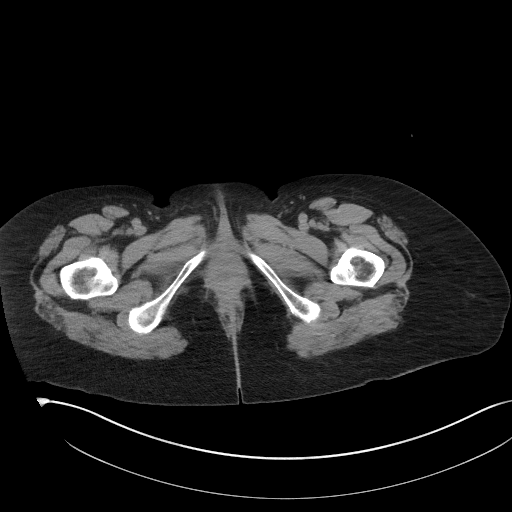
[im 6/99  bone]
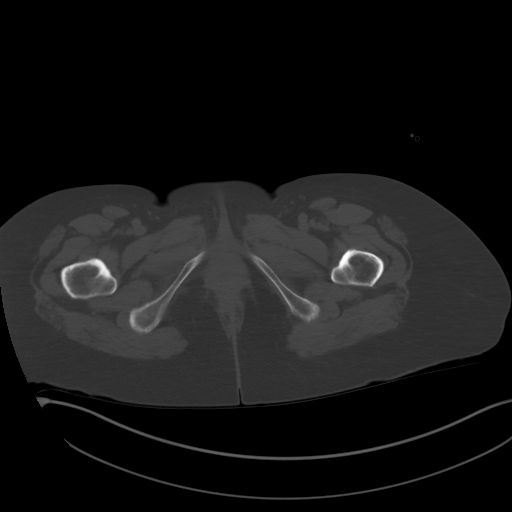
[im 16/99  soft-tissue]
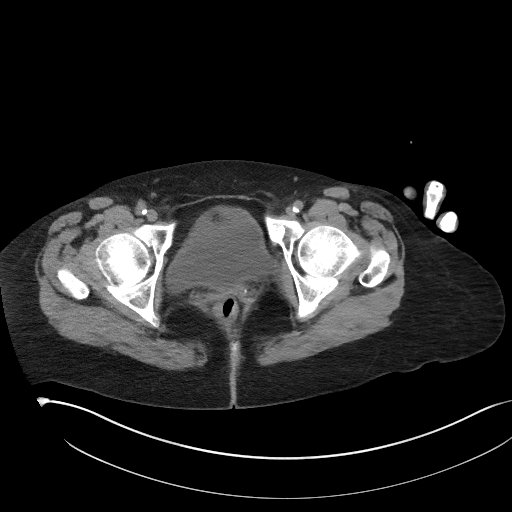
[im 21/99  soft-tissue]
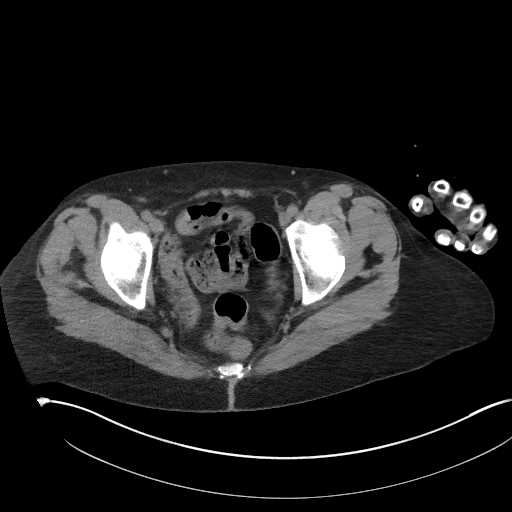
[im 26/99  soft-tissue]
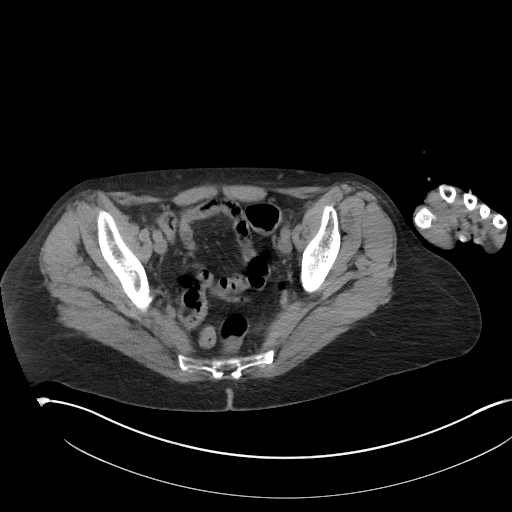
[im 37/99  soft-tissue]
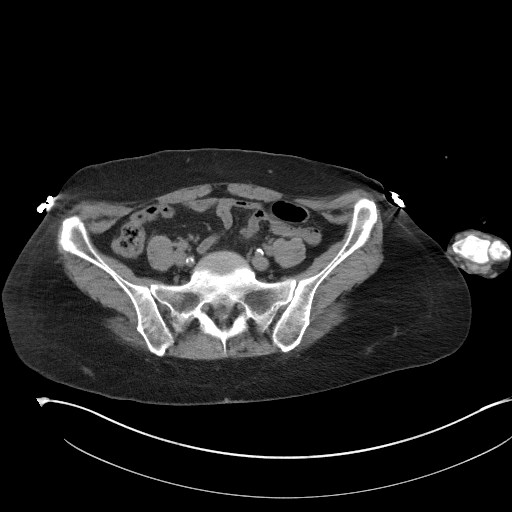
[im 42/99  soft-tissue]
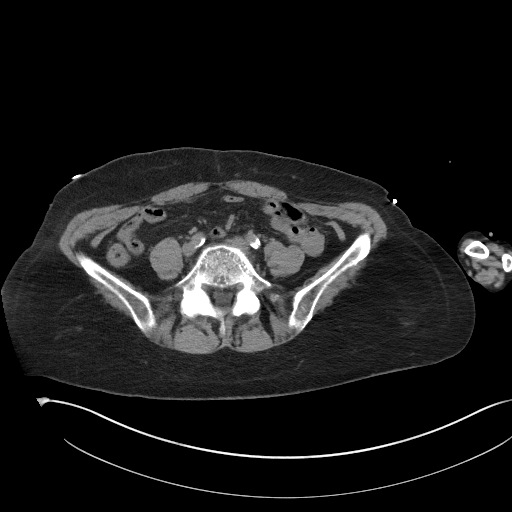
[im 52/99  soft-tissue]
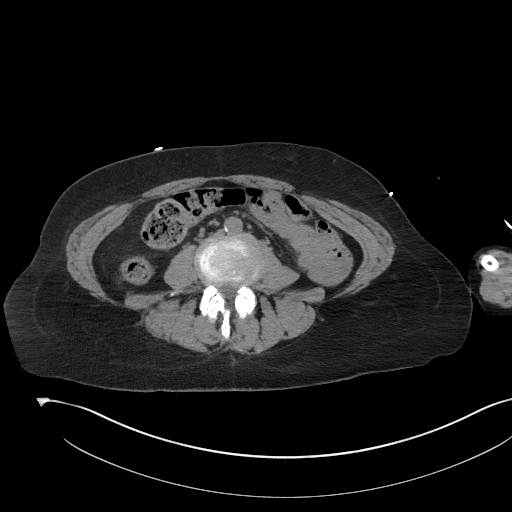
[im 57/99  soft-tissue]
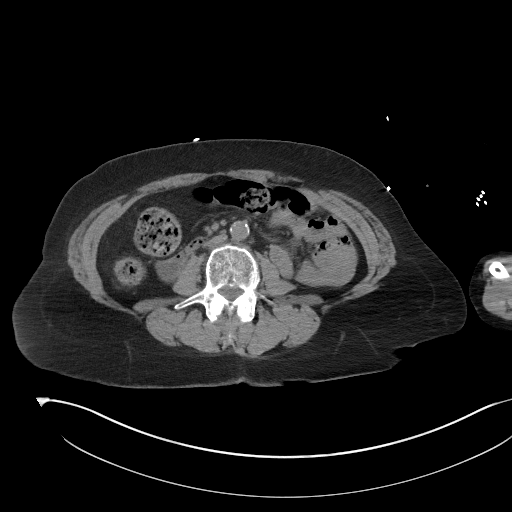
[im 62/99  soft-tissue]
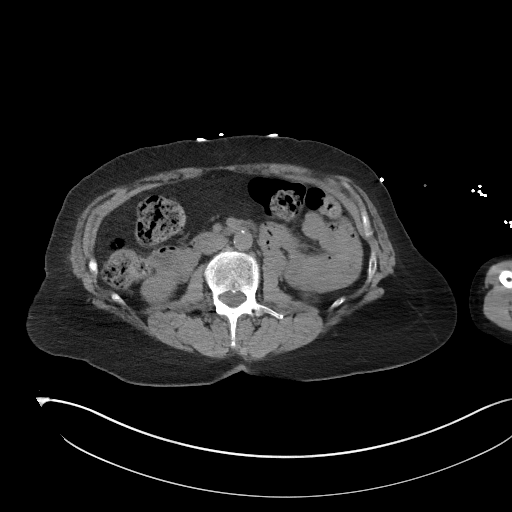
[im 62/99  bone]
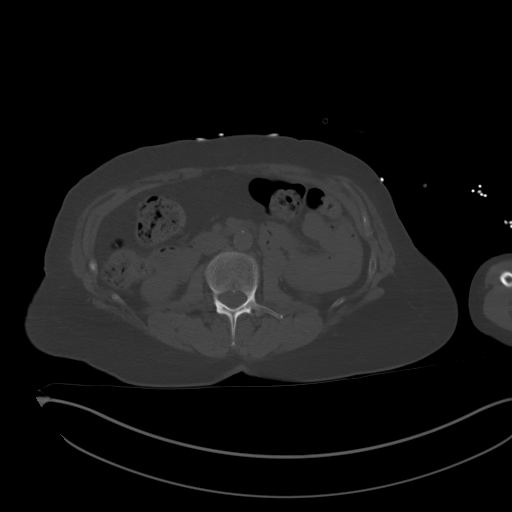
[im 73/99  soft-tissue]
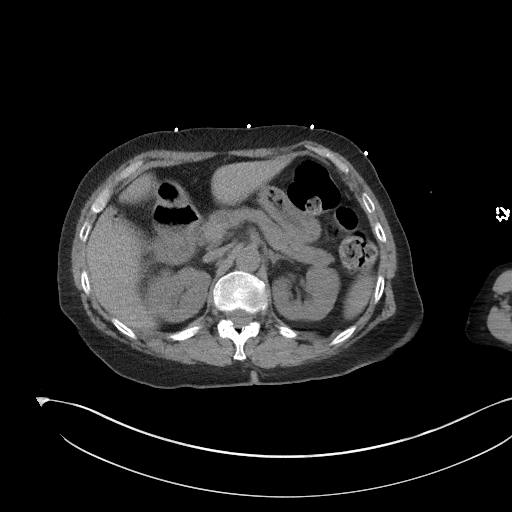
[im 78/99  soft-tissue]
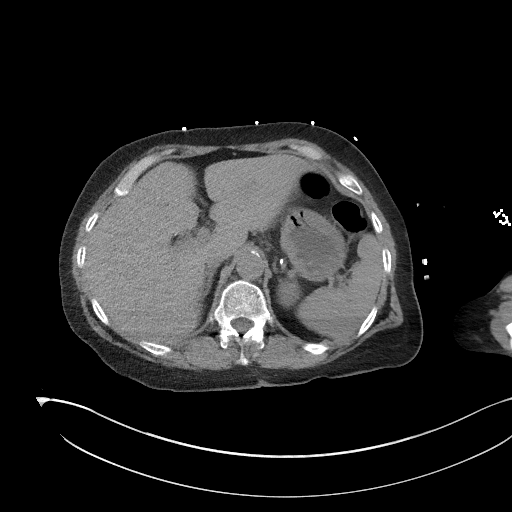
[im 83/99  soft-tissue]
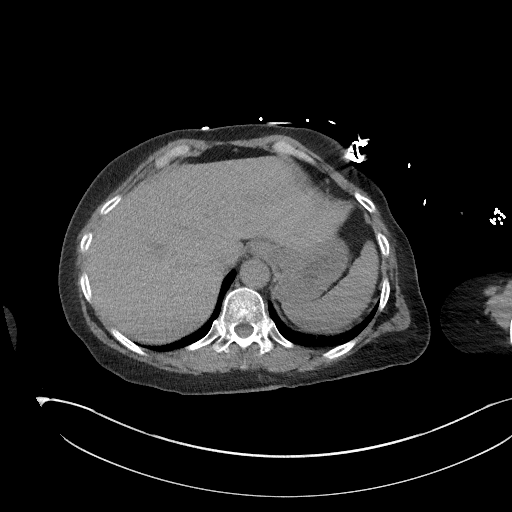
[im 93/99  soft-tissue]
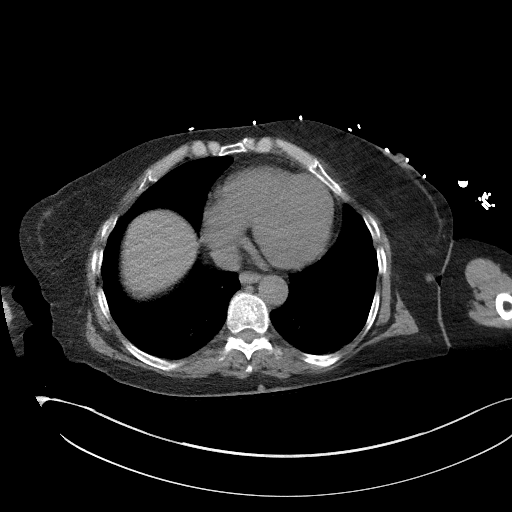

[Series 6: coronal st · coronal · 0.77mm/px · 3 of 102 slices shown]
[im 34/102  soft-tissue]
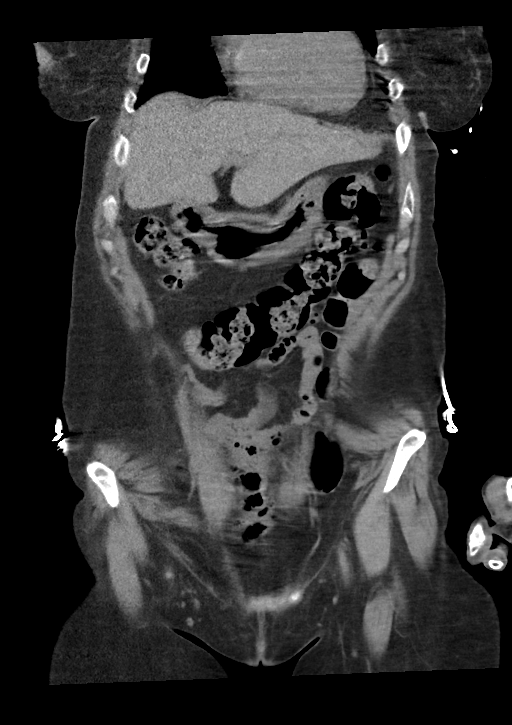
[im 45/102  soft-tissue]
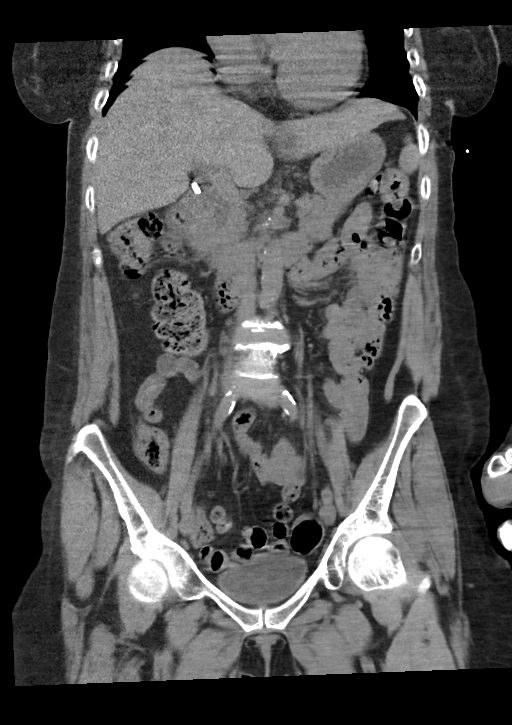
[im 57/102  soft-tissue]
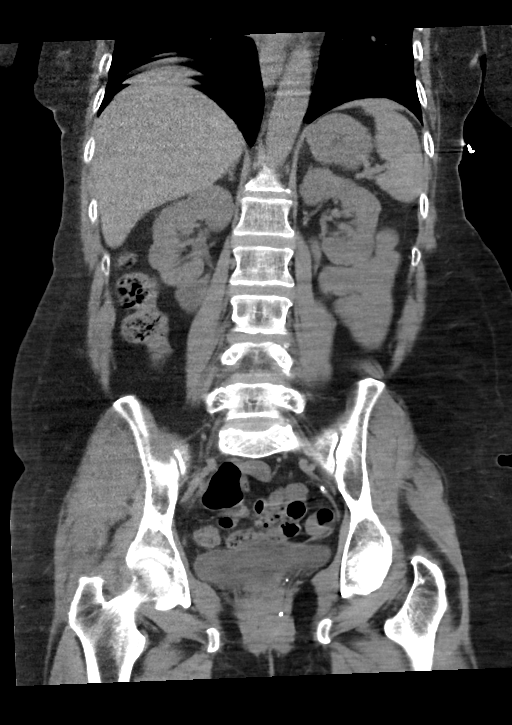

[16 of 46 positions shown; findings below may reference images not displayed]

FINDINGS: Lower chest: No acute abnormality.

Hepatobiliary: Prior cholecystectomy. No focal hepatic abnormality
or evidence of perihepatic hematoma.

Pancreas: No focal abnormality or ductal dilatation.

Spleen: No splenic injury or perisplenic hematoma.

Adrenals/Urinary Tract: No adrenal hemorrhage or renal injury
identified. Bladder is unremarkable.

Stomach/Bowel: Moderate stool burden throughout the colon. Stomach,
large and small bowel grossly unremarkable. Normal appendix.

Vascular/Lymphatic: Aortic atherosclerosis. No evidence of aneurysm
or adenopathy.

Reproductive: Prior hysterectomy.  No adnexal masses.

Other: No free fluid or free air.

Musculoskeletal: No acute bony abnormality.
IMPRESSION: No acute findings in the abdomen or pelvis.

Aortic atherosclerosis.

Moderate stool burden.

## 2021-10-07 IMAGING — DX DG CHEST 2V
2 series · 2 of 2 positions shown · non-contrast
Comparison: 01/10/2020

CLINICAL DATA: Dizziness and weakness

EXAM:
CHEST - 2 VIEW

[chest lat]
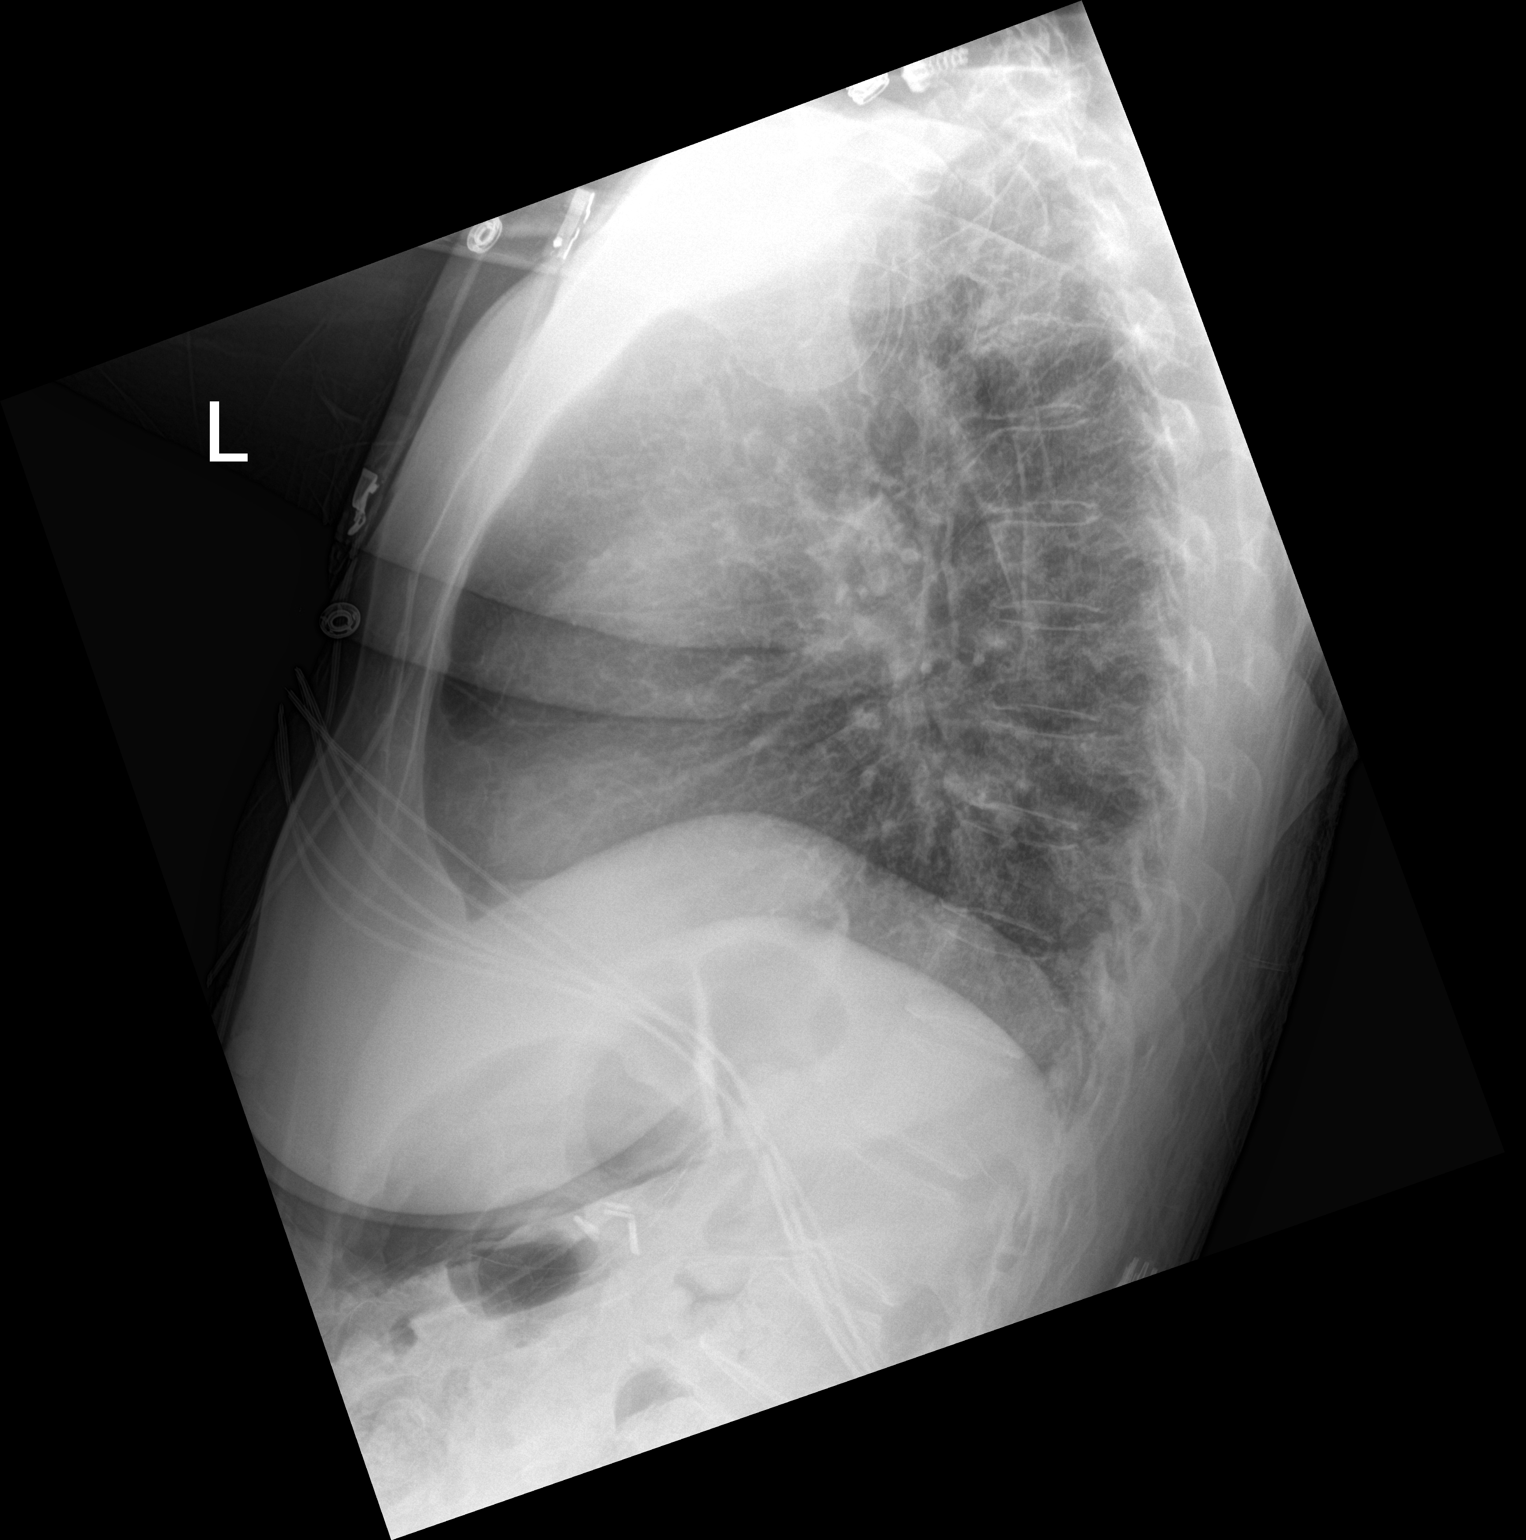

[chest ap]
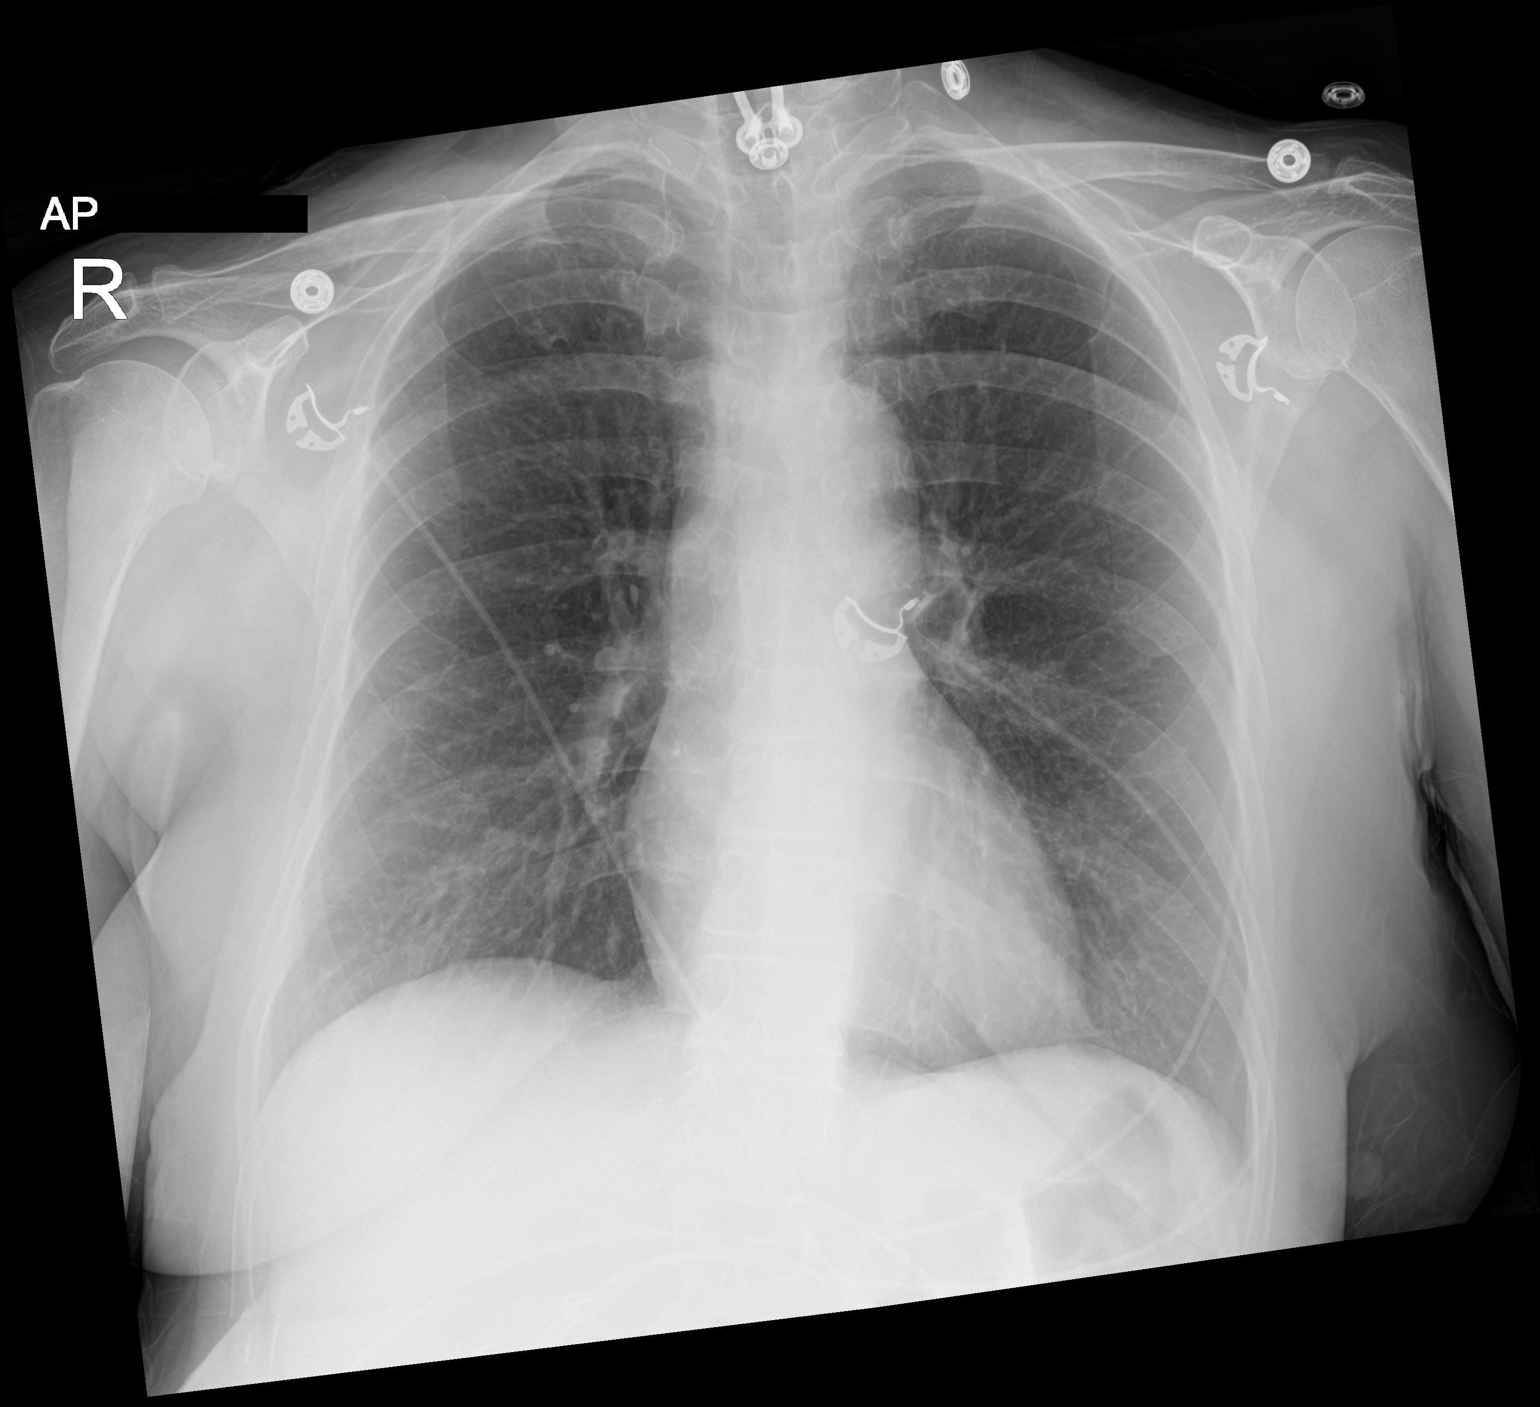

[2 of 2 positions shown; findings below may reference images not displayed]

FINDINGS: The heart size and mediastinal contours are within normal limits.
Both lungs are clear. The visualized skeletal structures are
unremarkable.
IMPRESSION: No active cardiopulmonary disease.

## 2021-10-07 IMAGING — MR MR HEAD W/O CM
11 of 12 series · 40 of 48 positions shown · non-contrast
Comparison: Head CT yesterday.  MRI 11/14/2020

CLINICAL DATA: Acute stroke presentation. Dizziness and weakness
with falling.

EXAM:
MRI HEAD WITHOUT CONTRAST
TECHNIQUE: Multiplanar, multiecho pulse sequences of the brain and surrounding
structures were obtained without intravenous contrast.

[Series 5: DWI · axial · 4.0mm · 0.88mm/px · z∈[-27,+106]mm · 4 of 36 slices shown (1 of 6)]
[im 1/36]
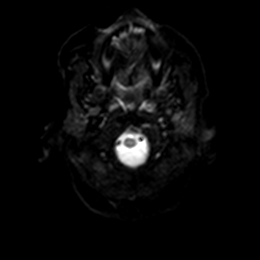
[im 12/36]
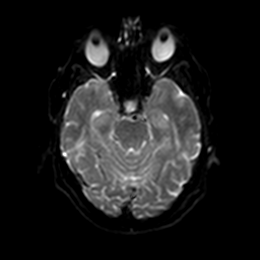
[im 24/36]
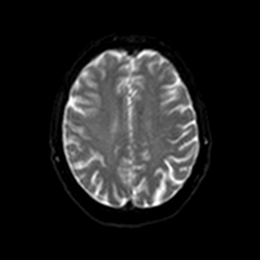
[im 36/36]
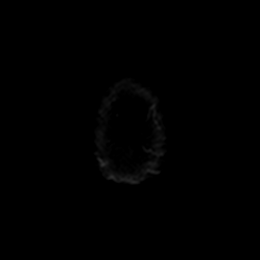

[Series 5: DWI · axial · 4.0mm · 0.88mm/px · z∈[-27,+106]mm · 4 of 36 slices shown (2 of 6)]
[im 1/36]
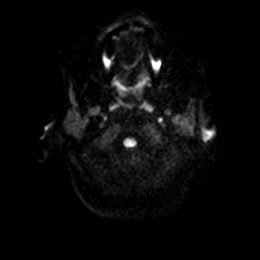
[im 12/36]
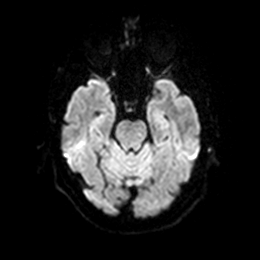
[im 24/36]
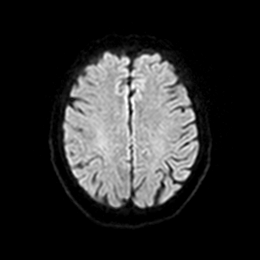
[im 36/36]
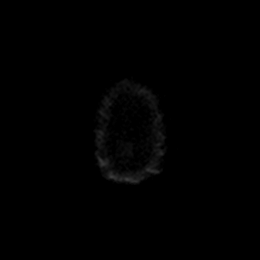

[Series 6: DWI · axial · 4.0mm · 0.88mm/px · z∈[-27,+106]mm · 4 of 36 slices shown (3 of 6)]
[im 1/36]
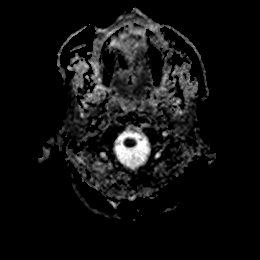
[im 12/36]
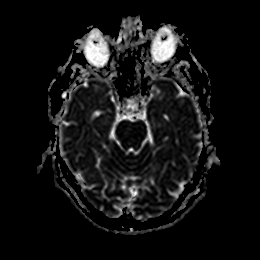
[im 24/36]
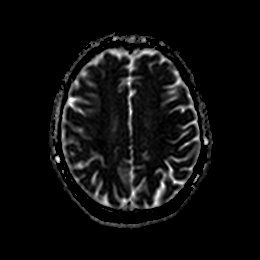
[im 36/36]
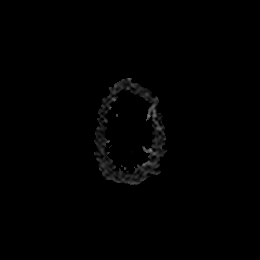

[Series 7: DWI · coronal · 5.0mm · 0.88mm/px · 3 of 28 slices shown (4 of 6)]
[im 1/28]
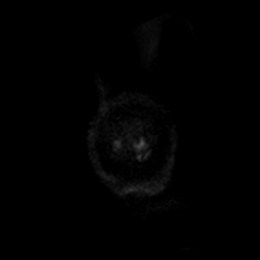
[im 14/28]
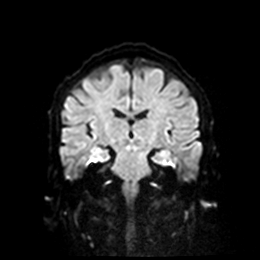
[im 28/28]
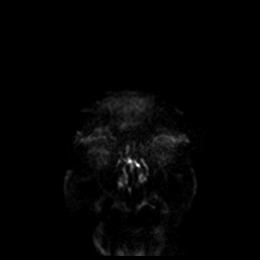

[Series 7: DWI · coronal · 5.0mm · 0.88mm/px · 4 of 28 slices shown (5 of 6)]
[im 1/28]
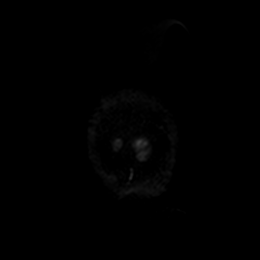
[im 10/28]
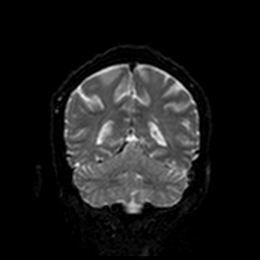
[im 19/28]
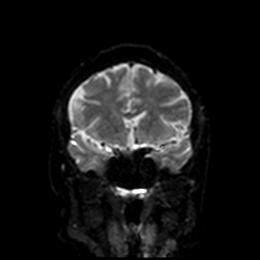
[im 28/28]
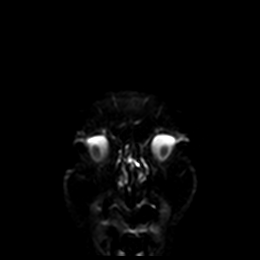

[Series 8: DWI · coronal · 5.0mm · 0.88mm/px · 4 of 28 slices shown (6 of 6)]
[im 1/28]
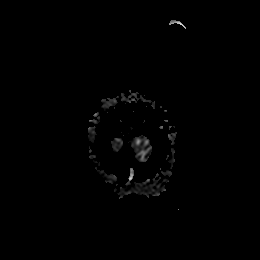
[im 10/28]
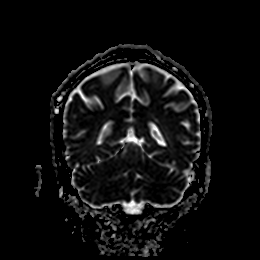
[im 19/28]
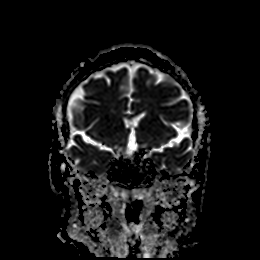
[im 28/28]
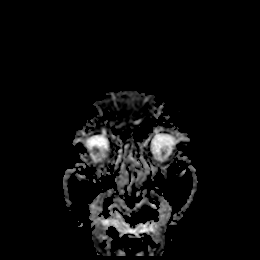

[Series 9: T1 · sagittal · 5.0mm · 0.94mm/px · 3 of 25 slices shown]
[im 1/25]
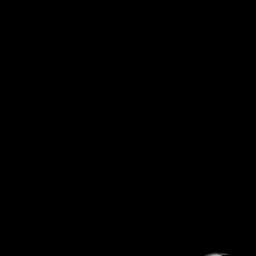
[im 13/25]
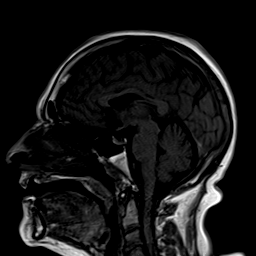
[im 25/25]
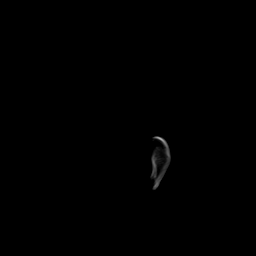

[Series 10: T2 · axial · 5.0mm · 0.72mm/px · z∈[-23,+103]mm · 3 of 20 slices shown (1 of 2)]
[im 1/20]
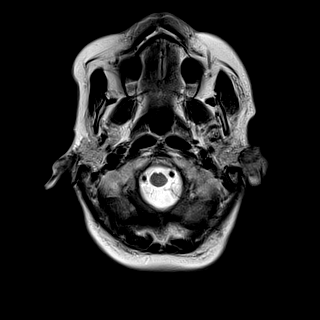
[im 10/20]
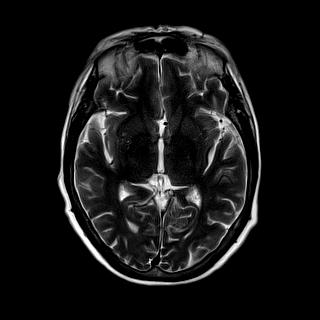
[im 20/20]
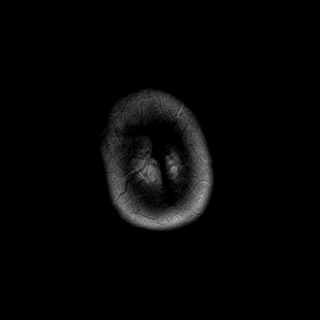

[Series 11: ax hemo · axial · 5.0mm · 0.86mm/px · z∈[-32,+104]mm · 3 of 25 slices shown]
[im 1/25]
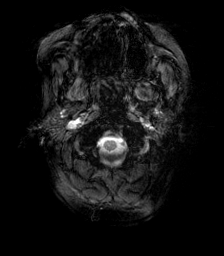
[im 13/25]
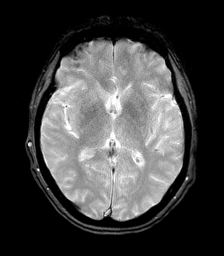
[im 25/25]
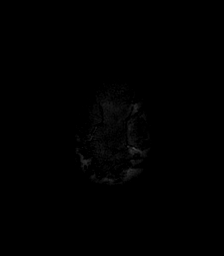

[Series 12: FLAIR · axial · 4.0mm · 0.43mm/px · z∈[-23,+95]mm · 4 of 32 slices shown]
[im 1/32]
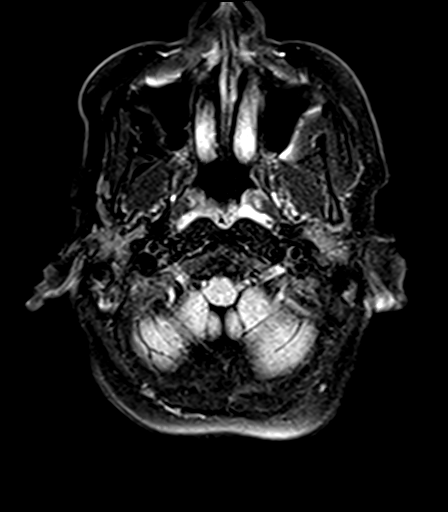
[im 11/32]
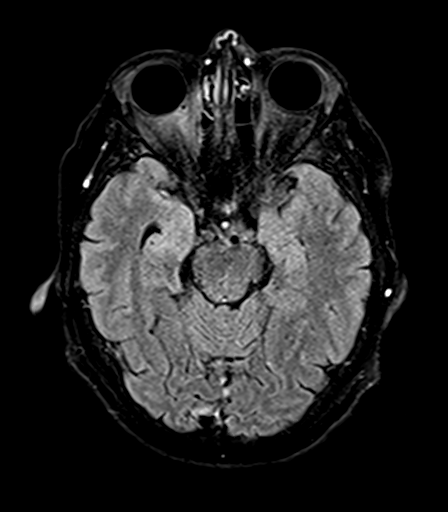
[im 21/32]
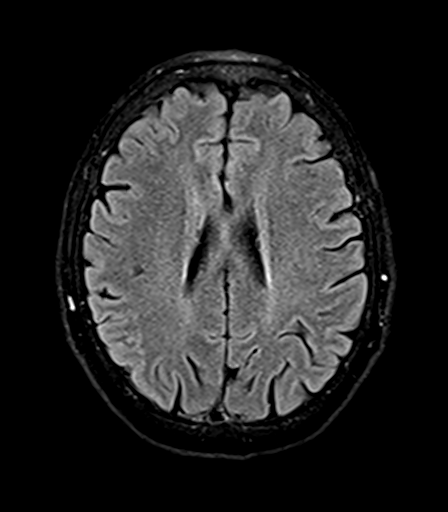
[im 32/32]
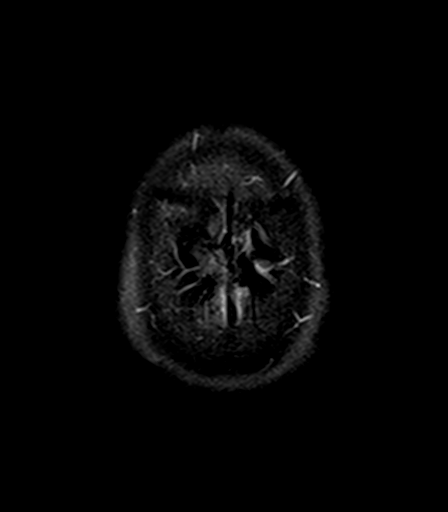

[Series 14: T2 · coronal · 5.0mm · 0.72mm/px · 4 of 28 slices shown (2 of 2)]
[im 1/28]
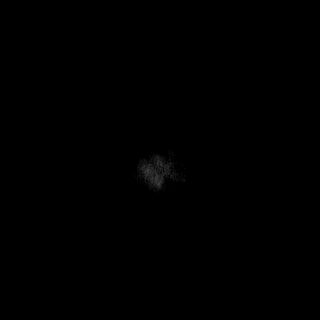
[im 10/28]
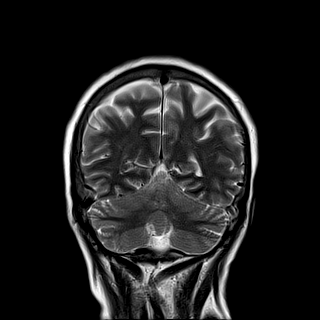
[im 19/28]
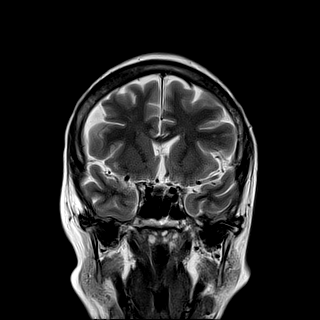
[im 28/28]
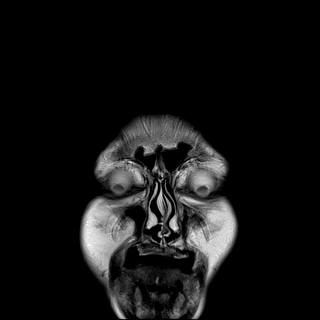

[40 of 48 positions shown; findings below may reference images not displayed]

FINDINGS: Brain: Newly seen punctate acute or subacute infarction in the lower
left paramedian mid brain. No other acute finding. The brainstem and
cerebellum appear otherwise normal. Cerebral hemispheres show a few
punctate foci of T2 and FLAIR signal within the deep and subcortical
white matter, often seen at this age. No cortical or large vessel
territory infarction. No mass lesion, hemorrhage, hydrocephalus or
extra-axial collection.

Vascular: Major vessels at the base of the brain show flow.

Skull and upper cervical spine: Negative

Sinuses/Orbits: Clear/normal

Other: None
IMPRESSION: Subtle evidence of a punctate acute infarction in the lower left
paramedian mid brain. No finding was present in this location on the
study of 11/14/2020. The remainder the brain is essentially normal
for age, with a few punctate foci of T2 and FLAIR signal within the
cerebral hemispheric white matter as seen previously.

## 2021-10-09 IMAGING — MR MR LUMBAR SPINE W/O CM
5 series · 31 of 48 positions shown · non-contrast
Comparison: 10/20/2020

CLINICAL DATA: Low back pain, technologist note states urinary
retention and worsening chronic back pain

EXAM:
MRI LUMBAR SPINE WITHOUT CONTRAST
TECHNIQUE: Multiplanar, multisequence MR imaging of the lumbar spine was
performed. No intravenous contrast was administered.

[Series 5: T2 · sagittal · 4.0mm · 0.68mm/px · 8 of 16 slices shown (1 of 2)]
[im 1/16]
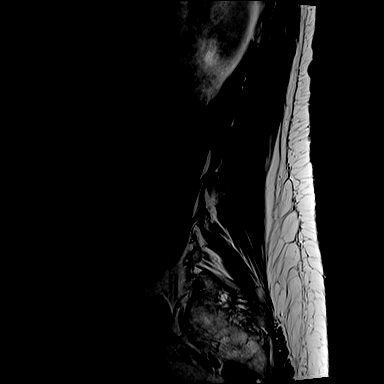
[im 3/16]
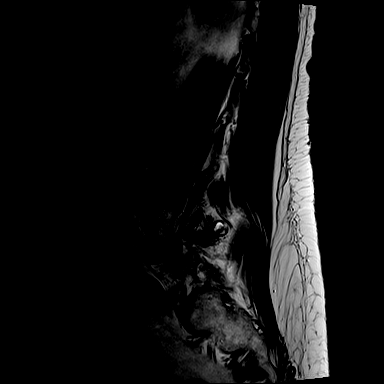
[im 5/16]
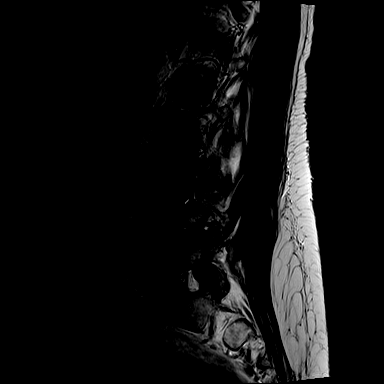
[im 7/16]
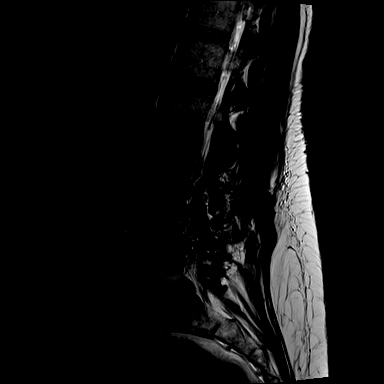
[im 9/16]
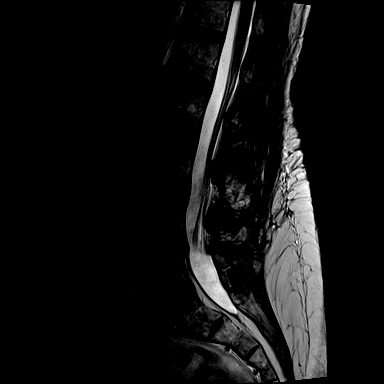
[im 11/16]
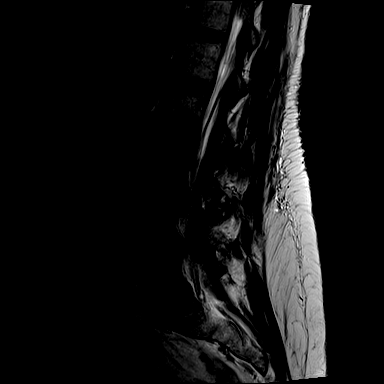
[im 13/16]
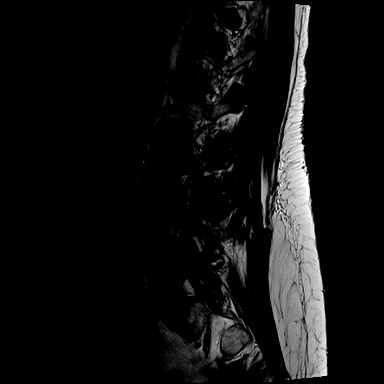
[im 16/16]
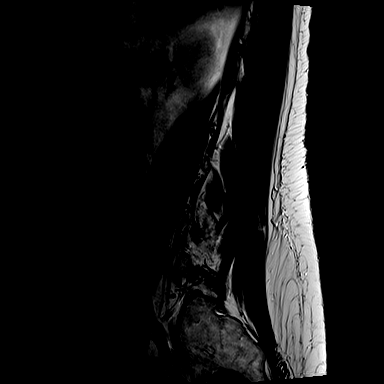

[Series 6: T1 · sagittal · 4.0mm · 0.81mm/px · 6 of 15 slices shown (1 of 2)]
[im 1/15]
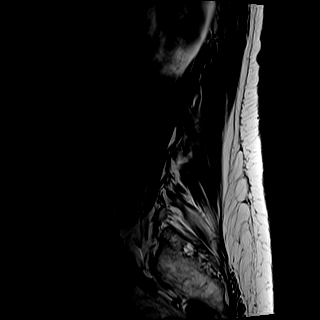
[im 3/15]
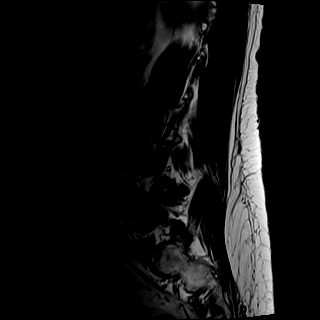
[im 6/15]
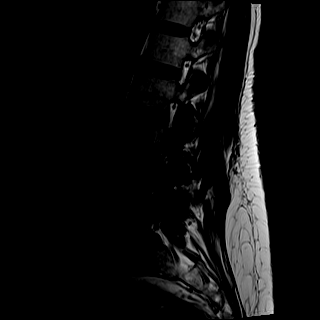
[im 9/15]
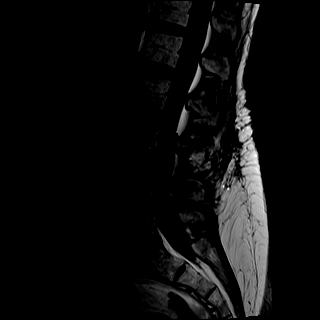
[im 12/15]
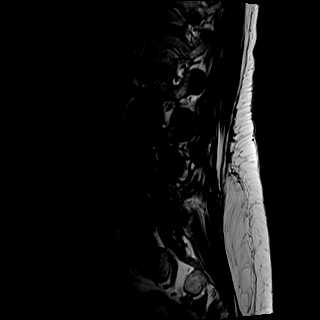
[im 15/15]
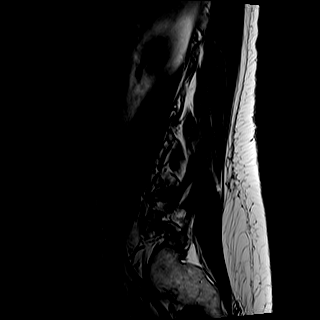

[Series 7: STIR · sagittal · 4.0mm · 0.51mm/px · 1 of 15 slices shown]
[im 1/15]
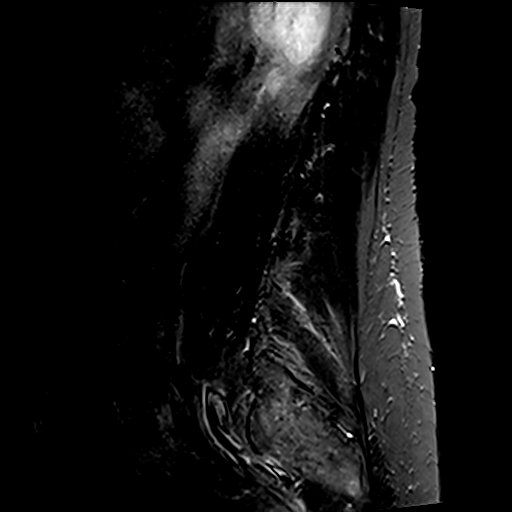

[Series 8: T2 · axial · 4.0mm · 0.70mm/px · z∈[-101,+110]mm · 8 of 35 slices shown (2 of 2)]
[im 1/35]
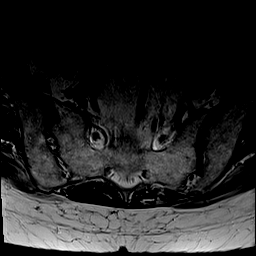
[im 6/35]
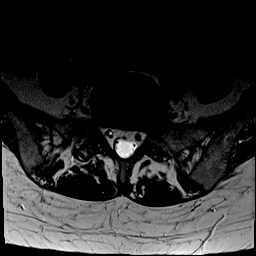
[im 11/35]
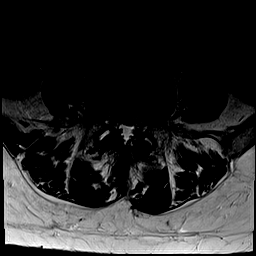
[im 16/35]
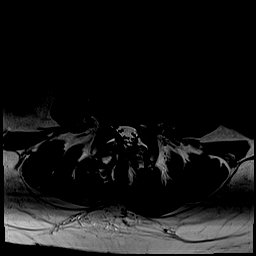
[im 19/35]
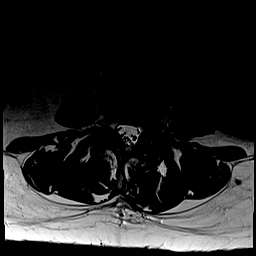
[im 24/35]
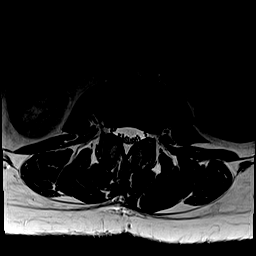
[im 29/35]
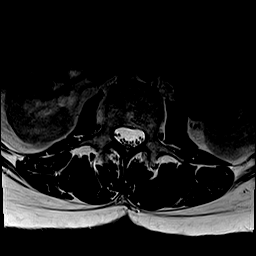
[im 35/35]
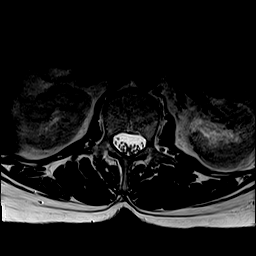

[Series 9: T1 · axial · 4.0mm · 0.35mm/px · z∈[-101,+110]mm · 8 of 35 slices shown (2 of 2)]
[im 1/35]
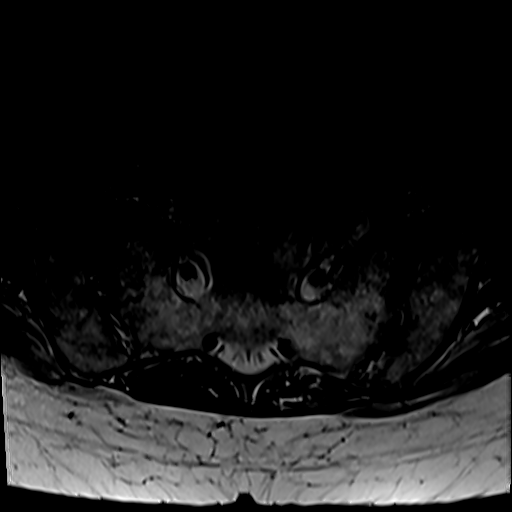
[im 6/35]
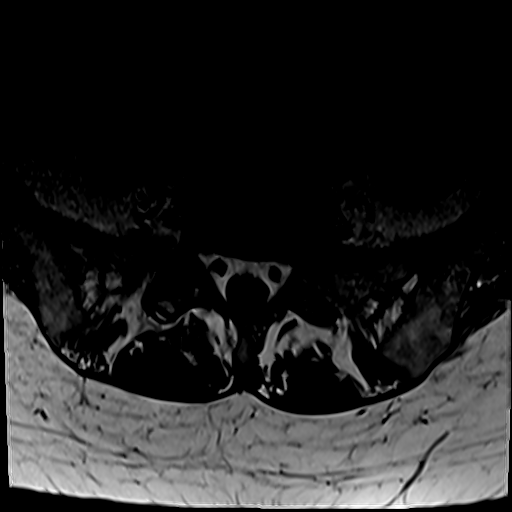
[im 11/35]
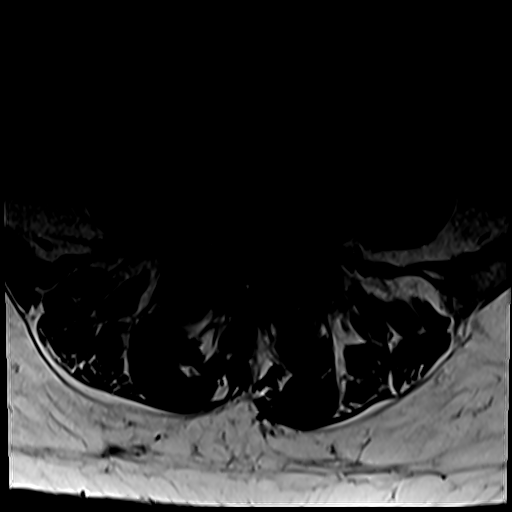
[im 16/35]
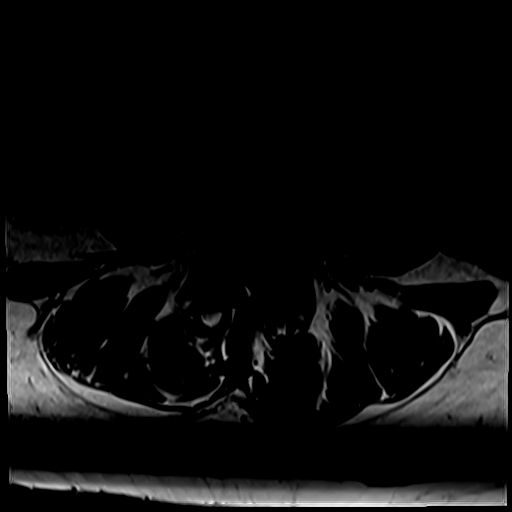
[im 19/35]
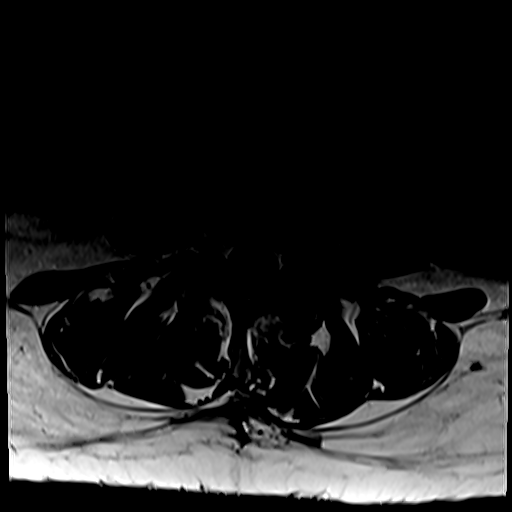
[im 24/35]
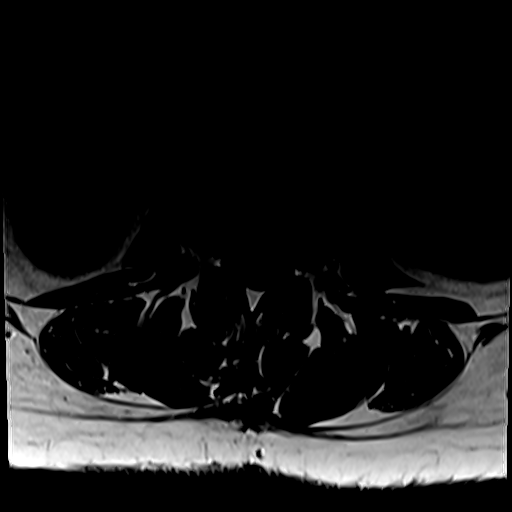
[im 29/35]
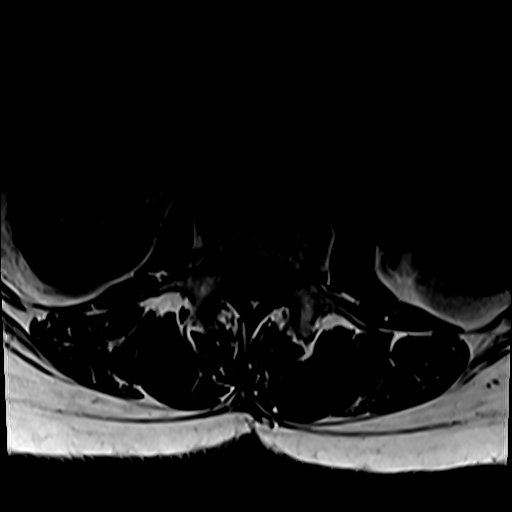
[im 35/35]
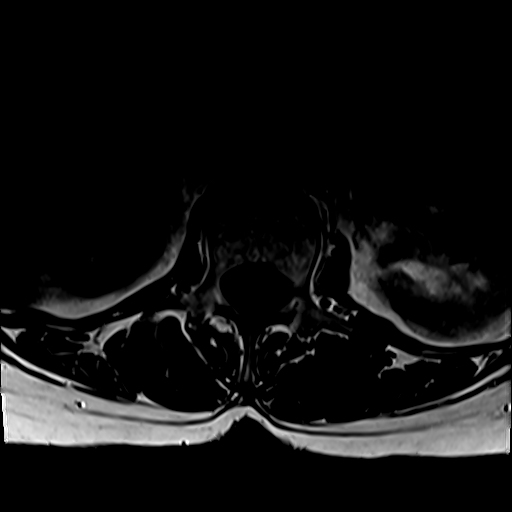

[31 of 48 positions shown; findings below may reference images not displayed]

FINDINGS: Segmentation:  Standard.

Alignment:  Anteroposterior alignment is maintained.

Vertebrae: Marrow edema associated with L4-L5 facet arthropathy.
Vertebral body heights are maintained. No suspicious osseous lesion.

Conus medullaris and cauda equina: Conus extends to the L1 level.
Conus and cauda equina appear normal.

Paraspinal and other soft tissues: Paraspinal edema associated with
L4-L5 facet arthropathy.

Disc levels:

L1-L2:  No stenosis.

L2-L3:  No stenosis.

L3-L4: Prior left laminectomy. Disc bulge with increased size of
left subarticular/foraminal annular fissure and shallow protrusion.
Facet arthropathy with residual right ligamentum flavum infolding.
No canal stenosis. Unchanged slight narrowing of the subarticular
recesses. Unchanged minor right foraminal stenosis. Slightly
increased mild to moderate left foraminal stenosis.

L4-L5: Prior left laminectomy. Disc bulge eccentric to the left.
Marked facet arthropathy with increased joint effusions. There is a
more cystic-appearing 9 mm synovial cyst along the left lateral
aspect of the spinal canal. Residual ligamentum flavum infolding.
Similar mild canal stenosis. Similar partial effacement the right
subarticular recess. Persistent effacement of the left subarticular
recess with compression of traversing L5 nerve roots. Similar mild
right and moderate left foraminal stenosis.

L5-S1: Similar moderate right and mild left facet arthropathy. No
canal or foraminal stenosis.
IMPRESSION: Degenerative and prior postoperative changes as detailed above.
There is no high-grade canal stenosis. Increased facet arthropathy
at L4-L5 with marrow and soft tissue edema, fusions, and intraspinal
synovial cyst compressing the traversing left L5 nerve roots.
Slightly increased disc protrusion/annular fissure and left
foraminal stenosis at L3-L4.

## 2021-10-21 ENCOUNTER — Ambulatory Visit (HOSPITAL_COMMUNITY)
Admission: RE | Admit: 2021-10-21 | Discharge: 2021-10-21 | Disposition: A | Discharge status: Home / Self Care | Payer: Medicare Other | Source: Ambulatory Visit | Attending: Internal Medicine | Admitting: Internal Medicine

## 2021-10-21 ENCOUNTER — Other Ambulatory Visit: Payer: Self-pay

## 2021-10-21 ENCOUNTER — Other Ambulatory Visit (HOSPITAL_COMMUNITY): Payer: Self-pay | Admitting: Internal Medicine

## 2021-10-21 DIAGNOSIS — M25572 Pain in left ankle and joints of left foot: Secondary | ICD-10-CM | POA: Diagnosis not present

## 2021-10-21 DIAGNOSIS — S99912A Unspecified injury of left ankle, initial encounter: Secondary | ICD-10-CM

## 2021-10-21 DIAGNOSIS — G894 Chronic pain syndrome: Secondary | ICD-10-CM | POA: Diagnosis not present

## 2021-10-21 DIAGNOSIS — M1991 Primary osteoarthritis, unspecified site: Secondary | ICD-10-CM | POA: Diagnosis not present

## 2021-10-21 DIAGNOSIS — S3992XA Unspecified injury of lower back, initial encounter: Secondary | ICD-10-CM | POA: Diagnosis not present

## 2021-10-21 DIAGNOSIS — E063 Autoimmune thyroiditis: Secondary | ICD-10-CM | POA: Diagnosis not present

## 2021-10-21 DIAGNOSIS — M533 Sacrococcygeal disorders, not elsewhere classified: Secondary | ICD-10-CM | POA: Diagnosis not present

## 2021-10-21 DIAGNOSIS — S59909A Unspecified injury of unspecified elbow, initial encounter: Secondary | ICD-10-CM | POA: Diagnosis not present

## 2021-10-21 DIAGNOSIS — S59902A Unspecified injury of left elbow, initial encounter: Secondary | ICD-10-CM

## 2021-10-21 DIAGNOSIS — M25522 Pain in left elbow: Secondary | ICD-10-CM | POA: Diagnosis not present

## 2021-10-21 DIAGNOSIS — M48061 Spinal stenosis, lumbar region without neurogenic claudication: Secondary | ICD-10-CM | POA: Diagnosis not present

## 2021-10-21 DIAGNOSIS — I10 Essential (primary) hypertension: Secondary | ICD-10-CM | POA: Diagnosis not present

## 2021-11-05 ENCOUNTER — Other Ambulatory Visit: Payer: Self-pay | Admitting: Neurology

## 2021-11-05 ENCOUNTER — Other Ambulatory Visit (HOSPITAL_COMMUNITY): Payer: Self-pay | Admitting: Neurology

## 2021-11-05 DIAGNOSIS — R9389 Abnormal findings on diagnostic imaging of other specified body structures: Secondary | ICD-10-CM

## 2021-11-19 ENCOUNTER — Ambulatory Visit (HOSPITAL_COMMUNITY): Payer: Medicare HMO

## 2021-11-19 ENCOUNTER — Encounter (HOSPITAL_COMMUNITY): Payer: Self-pay

## 2021-11-19 DIAGNOSIS — G894 Chronic pain syndrome: Secondary | ICD-10-CM | POA: Diagnosis not present

## 2021-11-19 DIAGNOSIS — M48061 Spinal stenosis, lumbar region without neurogenic claudication: Secondary | ICD-10-CM | POA: Diagnosis not present

## 2021-11-19 DIAGNOSIS — I10 Essential (primary) hypertension: Secondary | ICD-10-CM | POA: Diagnosis not present

## 2021-11-25 DIAGNOSIS — M545 Low back pain, unspecified: Secondary | ICD-10-CM | POA: Diagnosis not present

## 2021-11-25 DIAGNOSIS — E271 Primary adrenocortical insufficiency: Secondary | ICD-10-CM | POA: Diagnosis not present

## 2021-11-25 DIAGNOSIS — G603 Idiopathic progressive neuropathy: Secondary | ICD-10-CM | POA: Diagnosis not present

## 2021-11-25 DIAGNOSIS — R251 Tremor, unspecified: Secondary | ICD-10-CM | POA: Diagnosis not present

## 2021-11-25 DIAGNOSIS — M5416 Radiculopathy, lumbar region: Secondary | ICD-10-CM | POA: Diagnosis not present

## 2021-11-25 DIAGNOSIS — I6930 Unspecified sequelae of cerebral infarction: Secondary | ICD-10-CM | POA: Diagnosis not present

## 2021-11-25 IMAGING — CR DG CHEST 2V
2 series · 2 of 2 positions shown · non-contrast
Comparison: 01/26/2021 and CT chest 03/30/2017.

CLINICAL DATA: Preop.

EXAM:
CHEST - 2 VIEW

[w chest pa]
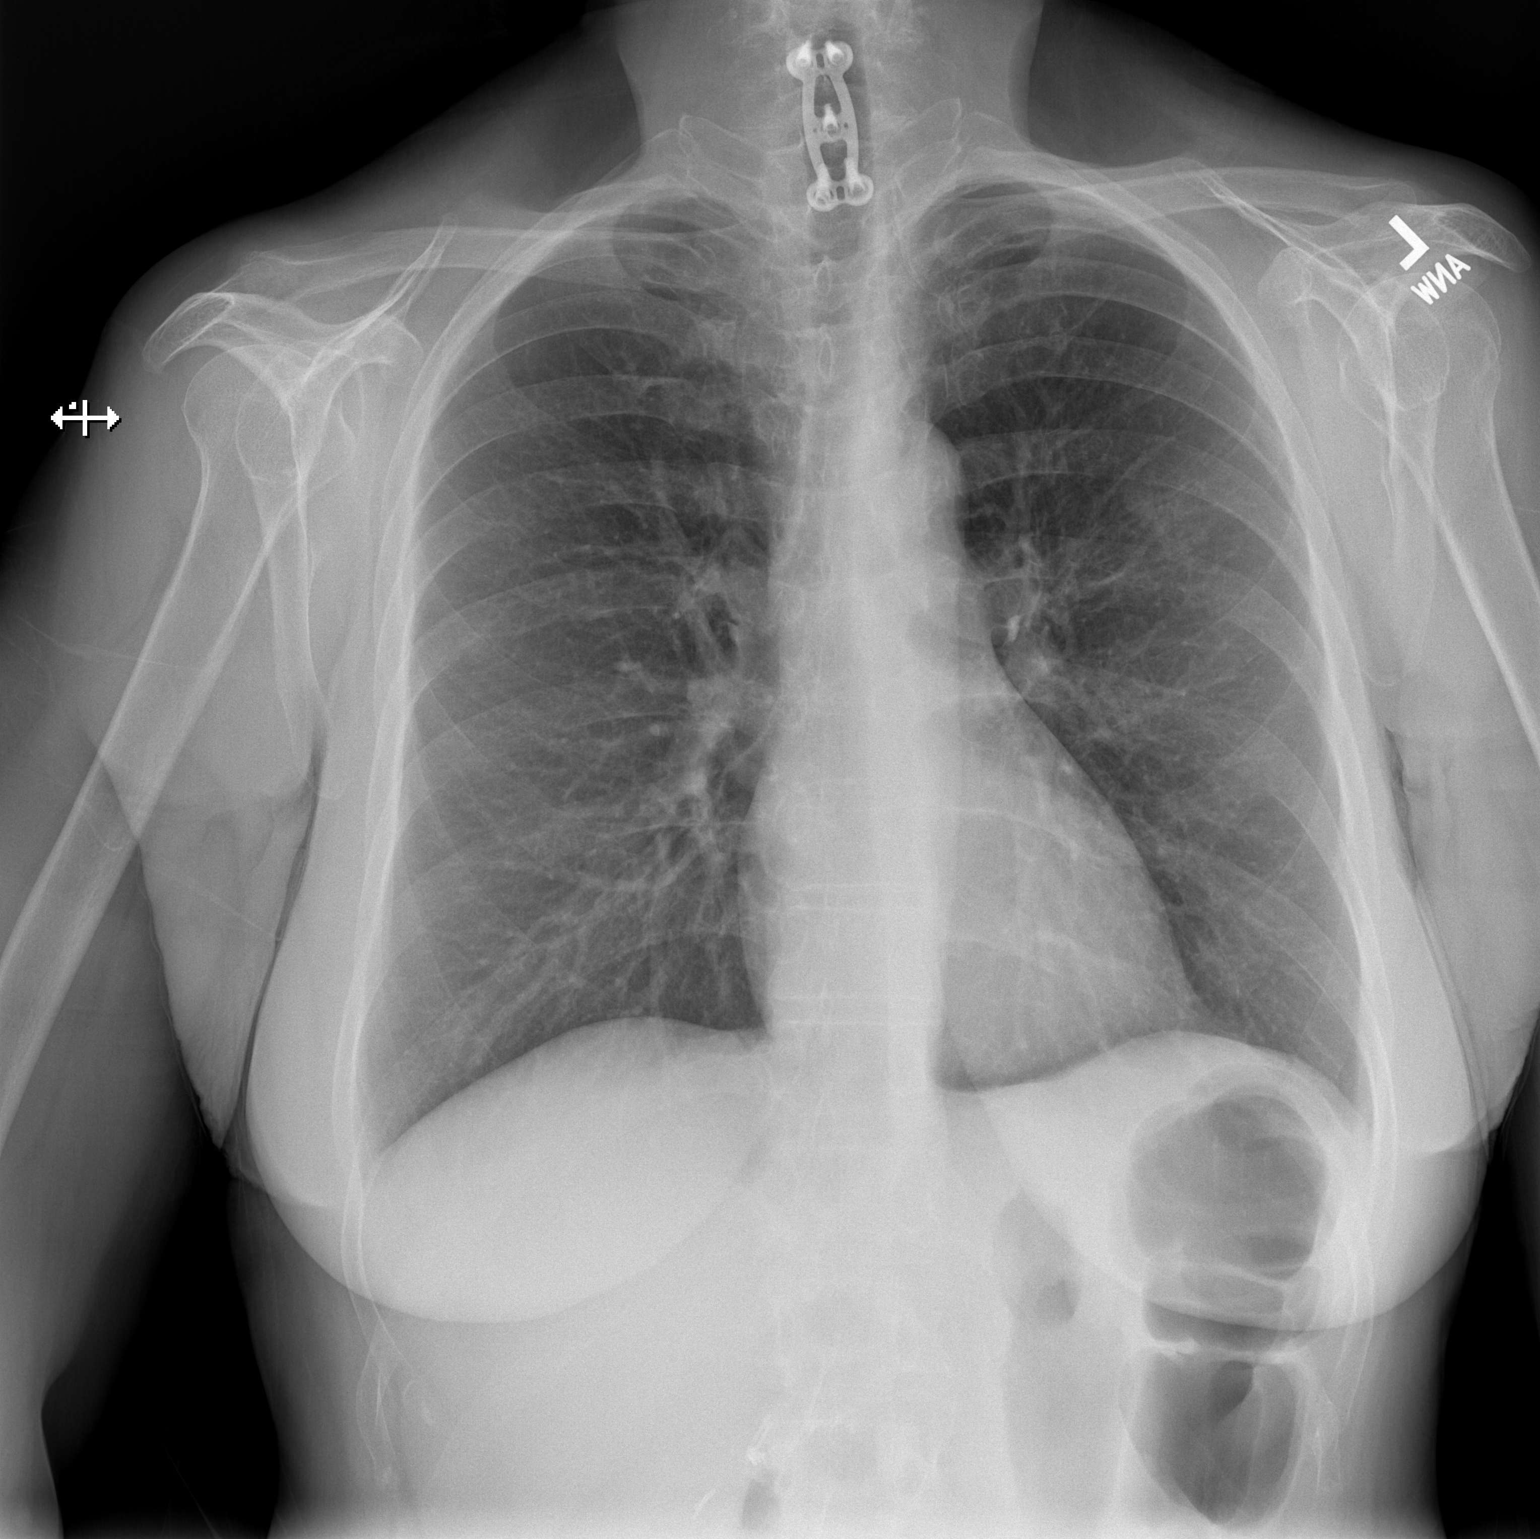

[w chest lat]
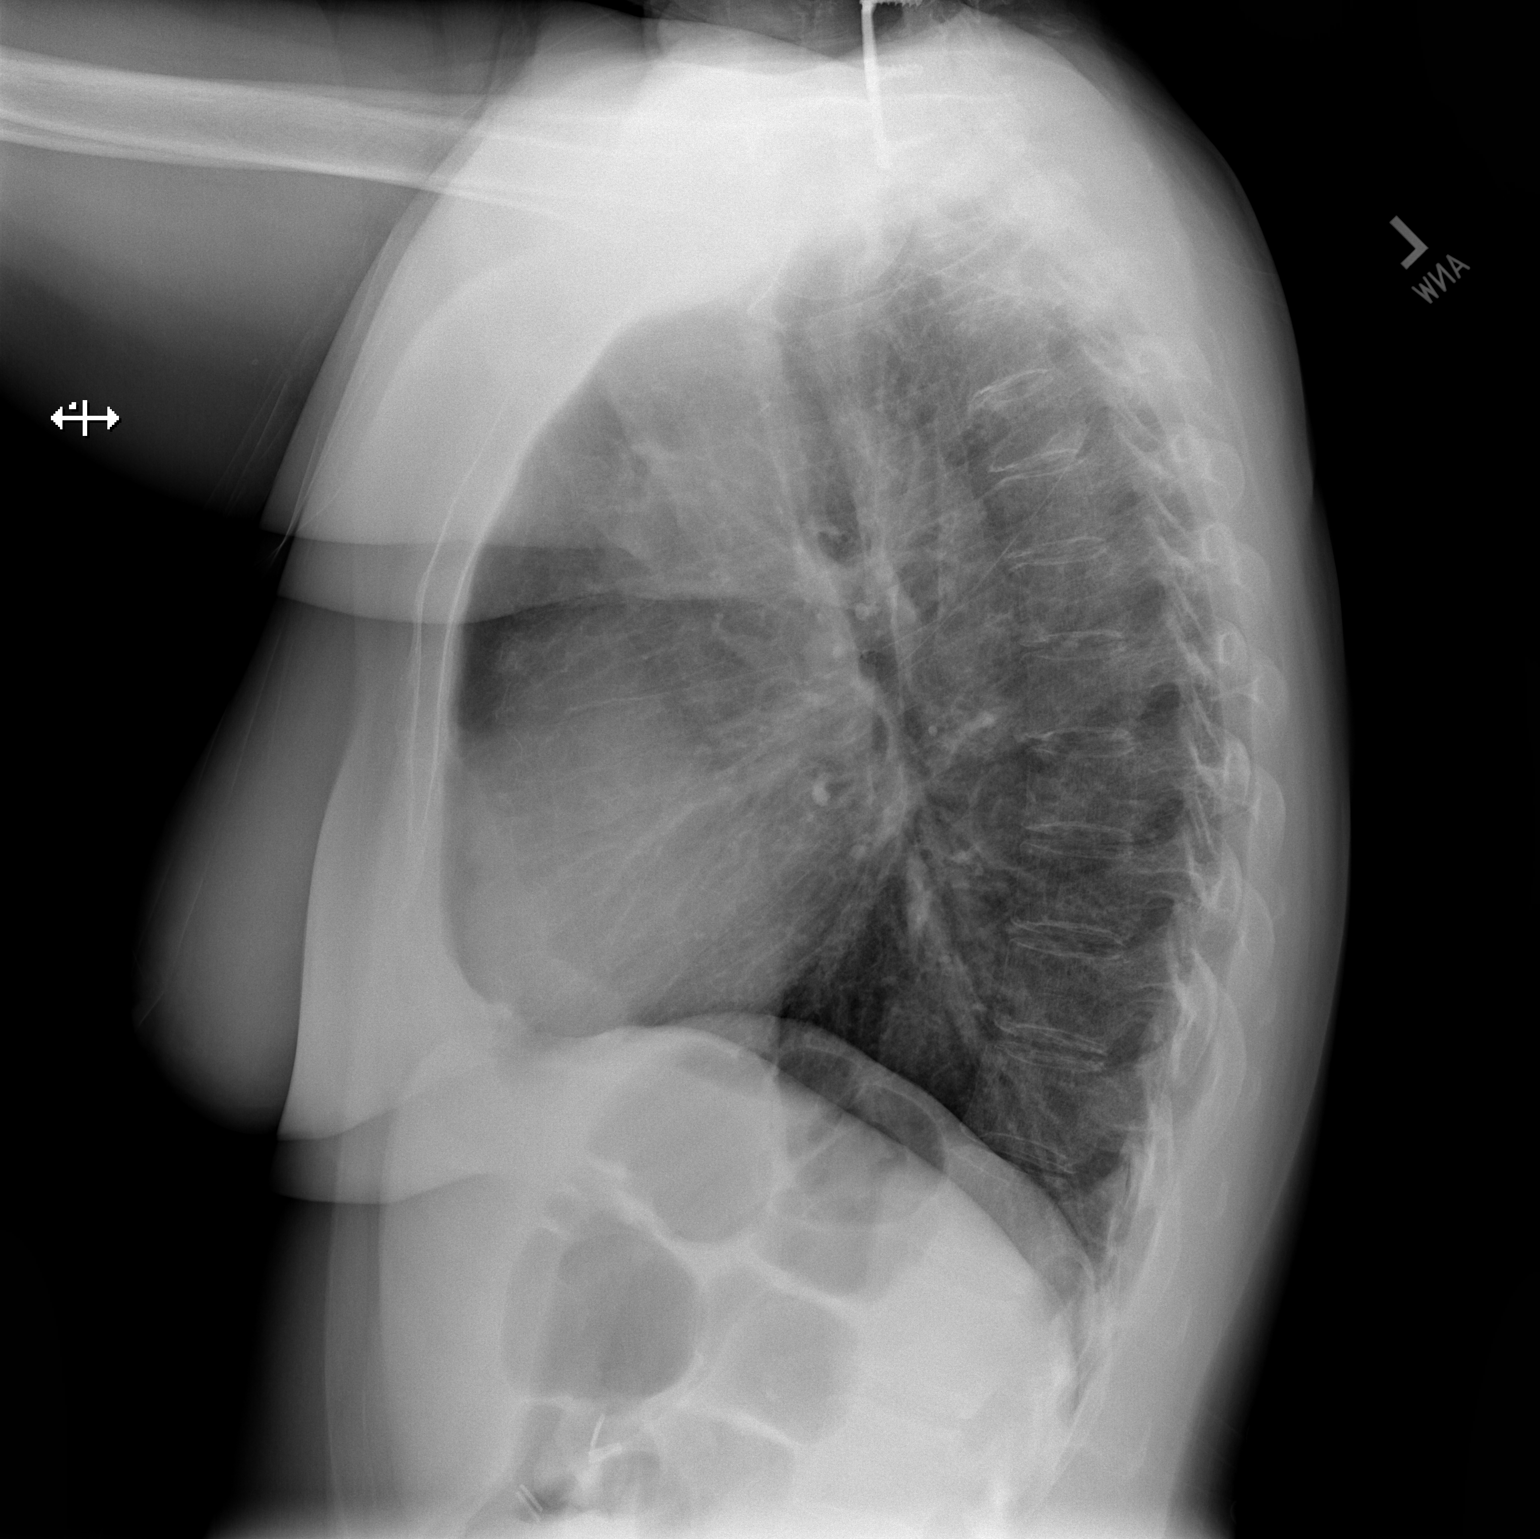

[2 of 2 positions shown; findings below may reference images not displayed]

FINDINGS: Trachea is midline. Heart size normal. There is a nodular added
density in the left upper lobe, not seen on prior exams. Lungs are
hyperinflated but otherwise clear.
IMPRESSION: 1. Nodular added density in the left upper lobe, not seen on prior
exams. CT chest without contrast is recommended in further
evaluation, as clinically indicated. These results will be called to
the ordering clinician or representative by the Radiologist
Assistant, and communication documented in the PACS or [[email or web address]].
2. Hyperinflation.

## 2021-11-26 ENCOUNTER — Other Ambulatory Visit (HOSPITAL_COMMUNITY): Payer: Self-pay | Admitting: Neurology

## 2021-11-26 DIAGNOSIS — M545 Low back pain, unspecified: Secondary | ICD-10-CM

## 2021-11-27 IMAGING — RF DG LUMBAR SPINE 2-3V
1 series · 2 of 2 positions shown · non-contrast
Comparison: Lumbar spine MRI 01/28/2021.

CLINICAL DATA: Surgery, elective. Additional history provided:
L3-4/L4-5 PLIF. Provided fluoroscopy time 1 minutes, 27 seconds
(69.22 mGy).

EXAM:
LUMBAR SPINE - 2-3 VIEW; DG C-ARM 1-60 MIN

[Series 1: run · 2 of 2 slices shown]
[im 1/2]
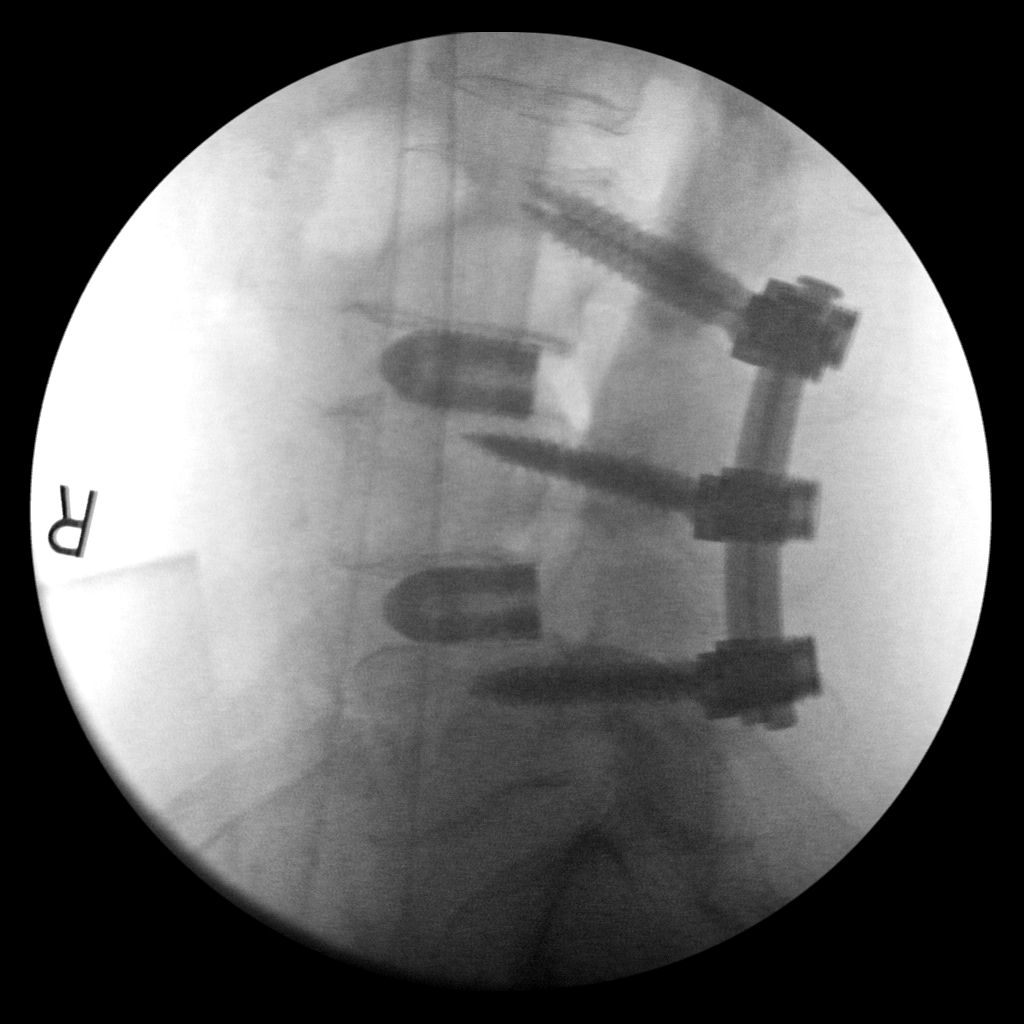
[im 2/2]
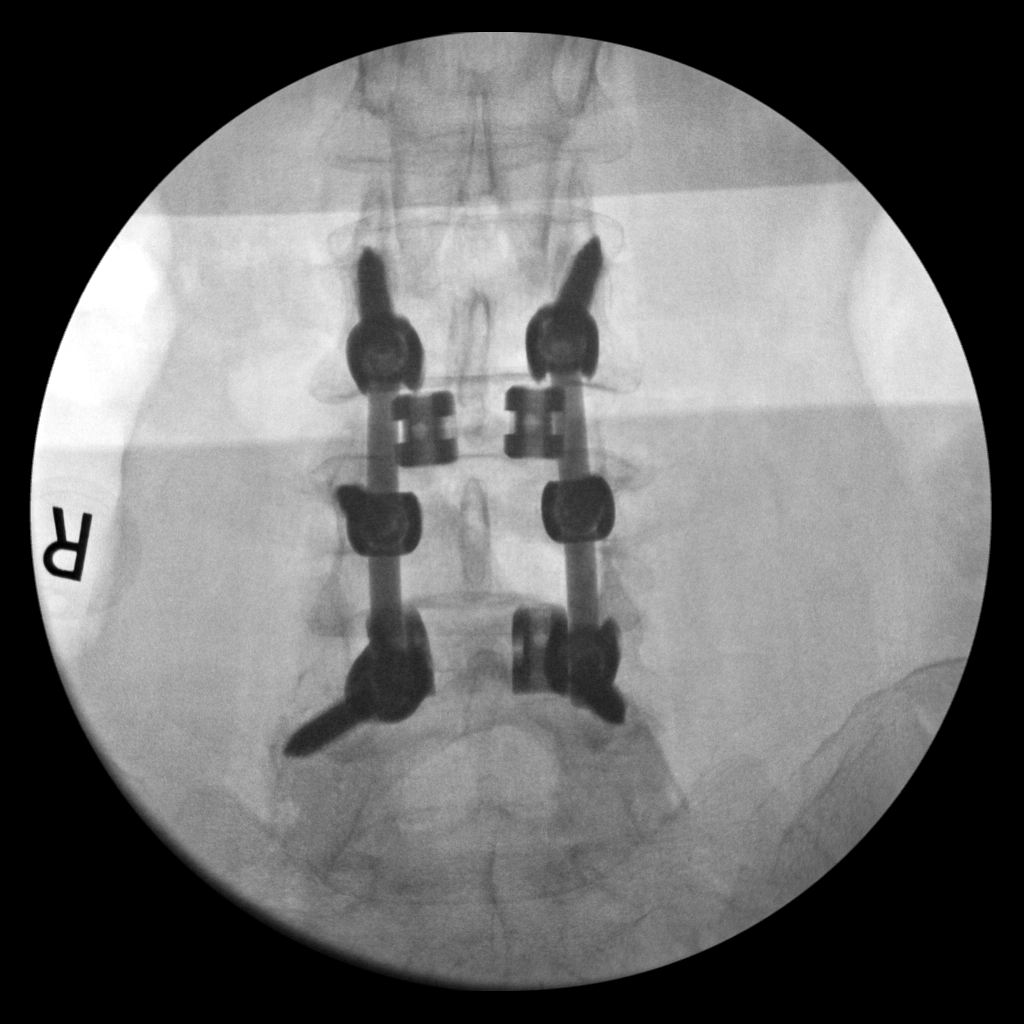

[2 of 2 positions shown; findings below may reference images not displayed]

FINDINGS: AP and lateral view intraoperative fluoroscopic images of the lumbar
spine are submitted, 2 images total. The lowest well-formed
intervertebral disc space is designated L5-S1. On the provided
images, a posterior spinal fusion construct spans the L3-L5 levels
(bilateral pedicle screws and vertical interconnecting rods).
Interbody devices also present at L3-L4 and L4-L5.
IMPRESSION: Two intraoperative fluoroscopic images of the lumbar spine from
L3-L5 posterior fusion, as described.

## 2021-11-27 IMAGING — RF DG C-ARM 1-60 MIN
1 series · 2 of 2 positions shown · non-contrast
Comparison: Lumbar spine MRI 01/28/2021.

CLINICAL DATA: Surgery, elective. Additional history provided:
L3-4/L4-5 PLIF. Provided fluoroscopy time 1 minutes, 27 seconds
(69.22 mGy).

EXAM:
LUMBAR SPINE - 2-3 VIEW; DG C-ARM 1-60 MIN

[Series 1: run · 2 of 2 slices shown]
[im 1/2]
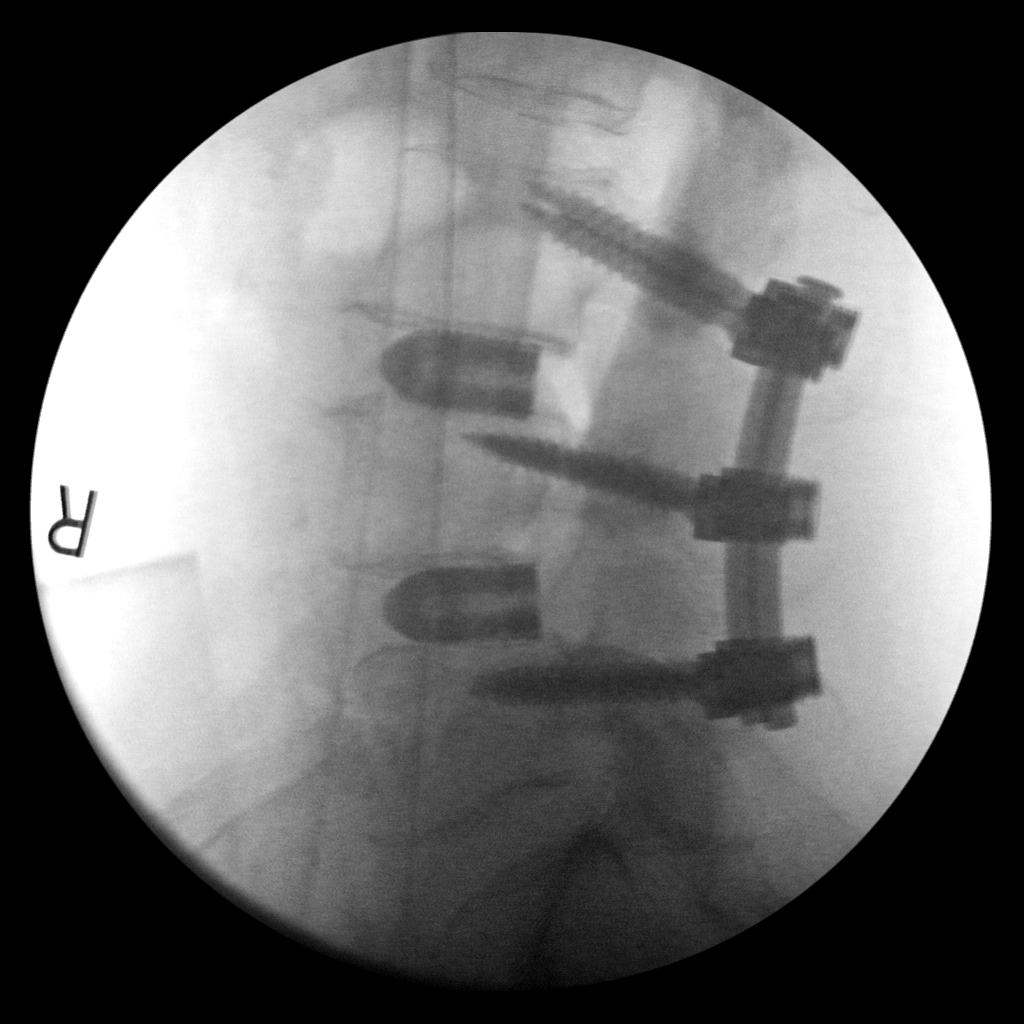
[im 2/2]
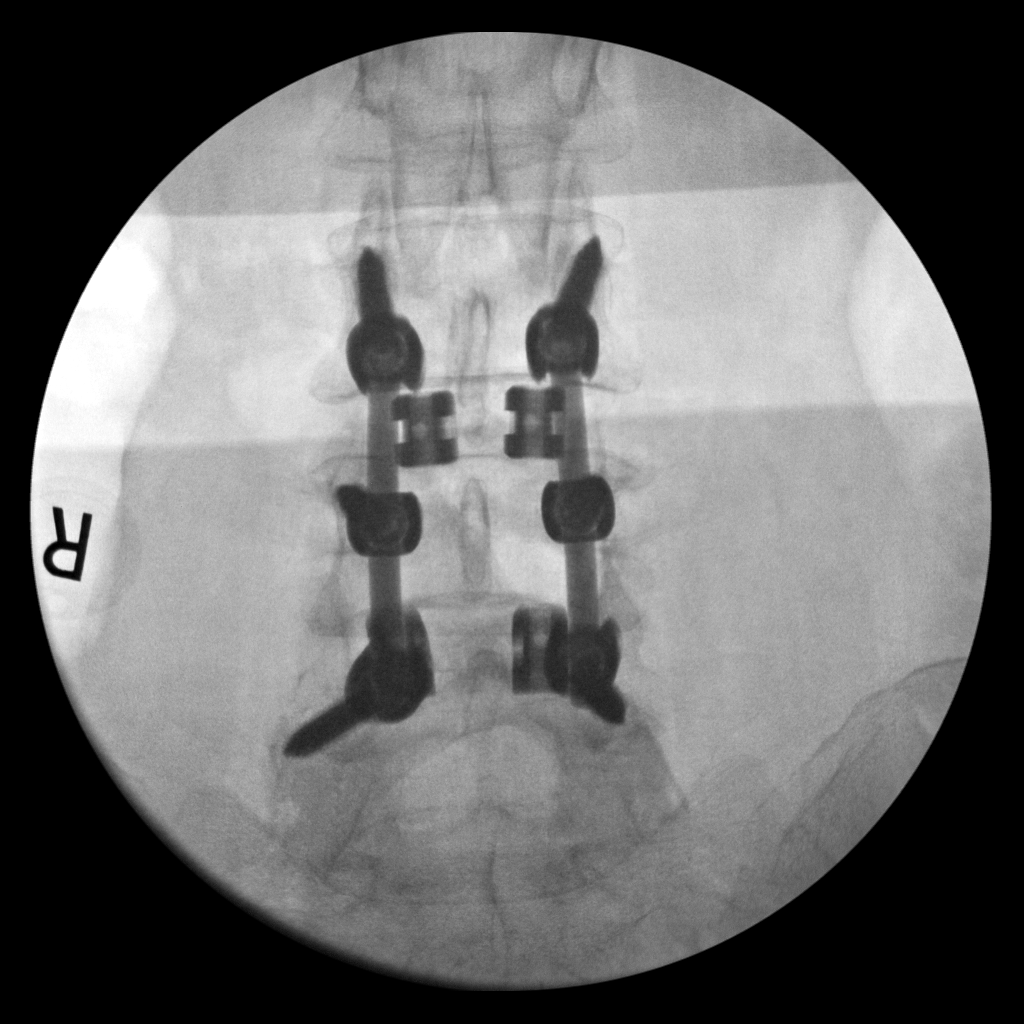

[2 of 2 positions shown; findings below may reference images not displayed]

FINDINGS: AP and lateral view intraoperative fluoroscopic images of the lumbar
spine are submitted, 2 images total. The lowest well-formed
intervertebral disc space is designated L5-S1. On the provided
images, a posterior spinal fusion construct spans the L3-L5 levels
(bilateral pedicle screws and vertical interconnecting rods).
Interbody devices also present at L3-L4 and L4-L5.
IMPRESSION: Two intraoperative fluoroscopic images of the lumbar spine from
L3-L5 posterior fusion, as described.

## 2021-11-29 IMAGING — CT CT CHEST W/O CM
2 of 3 series · 15 of 36 positions shown, 18 images · non-contrast
Comparison: Chest x-ray dated March 16, 2021. CT chest dated [place]

CLINICAL DATA: Left upper lobe lung nodule on preoperative chest
x-ray. Lumbar fusion 2 days ago.

EXAM:
CT CHEST WITHOUT CONTRAST
TECHNIQUE: Multidetector CT imaging of the chest was performed following the
standard protocol without IV contrast.

[Series 3: chest w/o 2mm st · axial · non-contrast · 0.64mm/px · z∈[-348,-64]mm · 12 of 168 slices shown, 15 images]
[im 13/168  mediastinal]
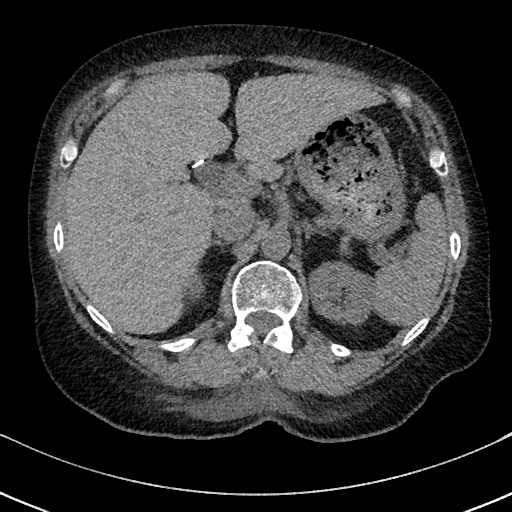
[im 13/168  lung]
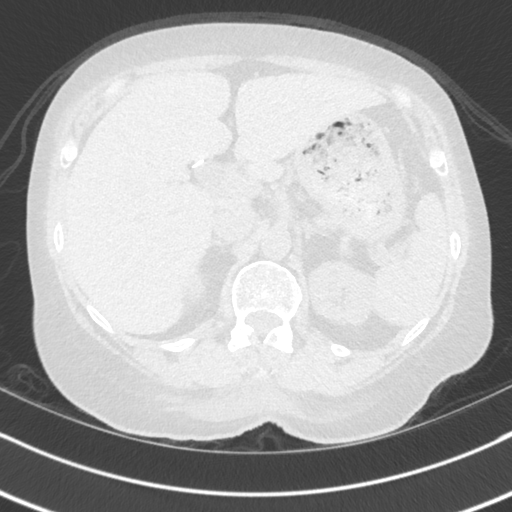
[im 25/168  lung]
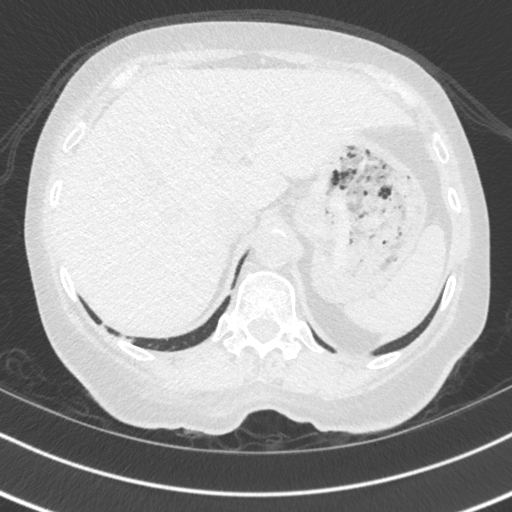
[im 38/168  lung]
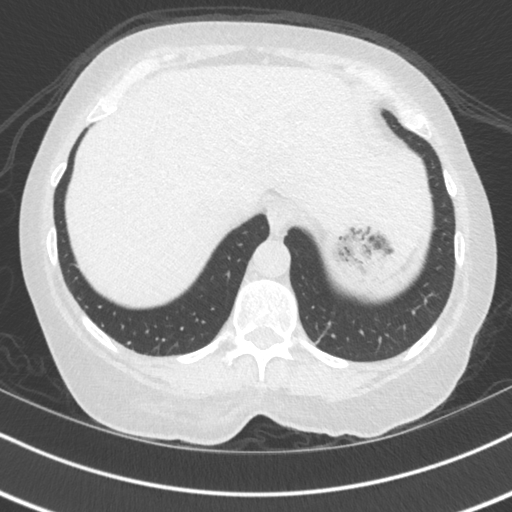
[im 50/168  lung]
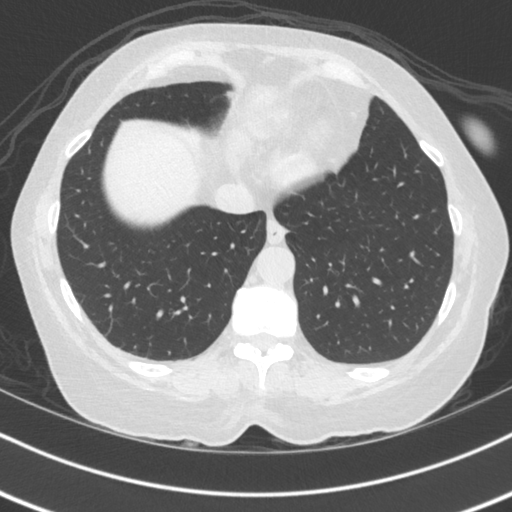
[im 62/168  mediastinal]
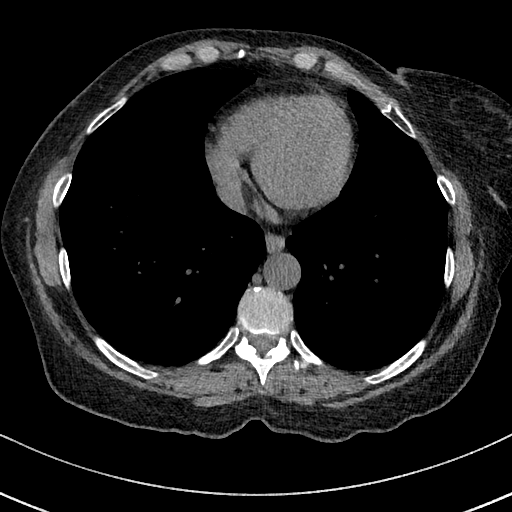
[im 62/168  lung]
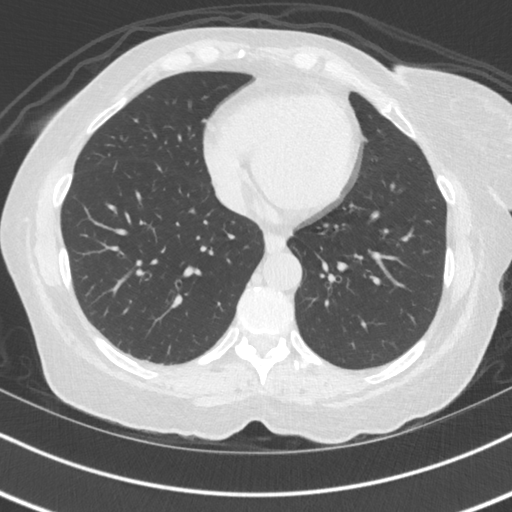
[im 75/168  lung]
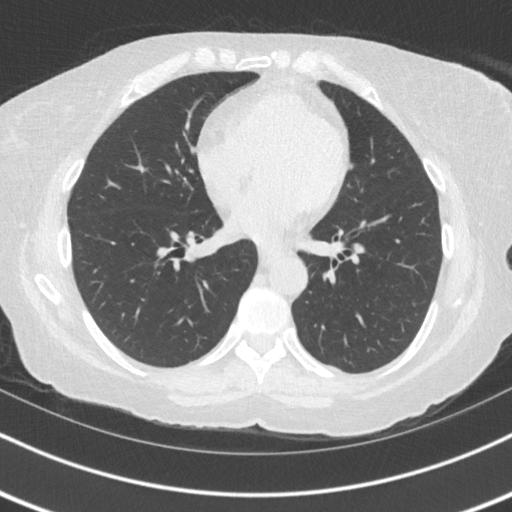
[im 93/168  lung]
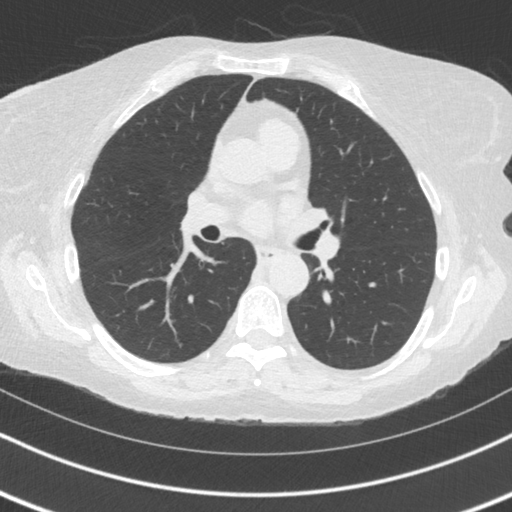
[im 106/168  lung]
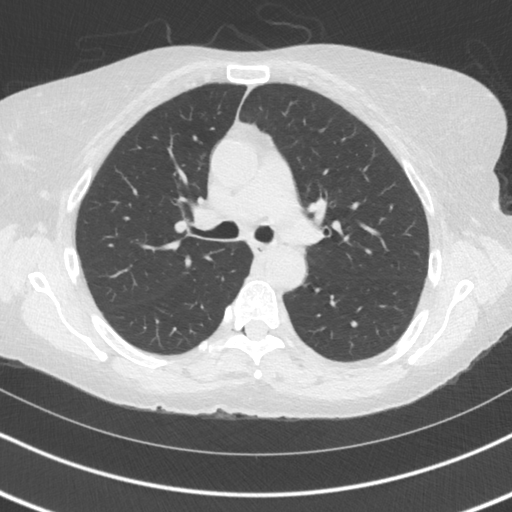
[im 118/168  mediastinal]
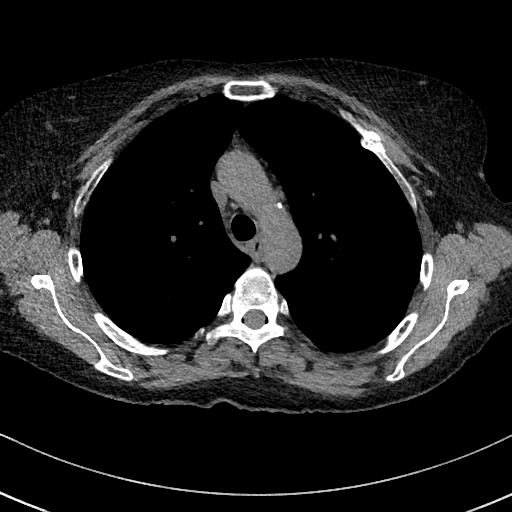
[im 118/168  lung]
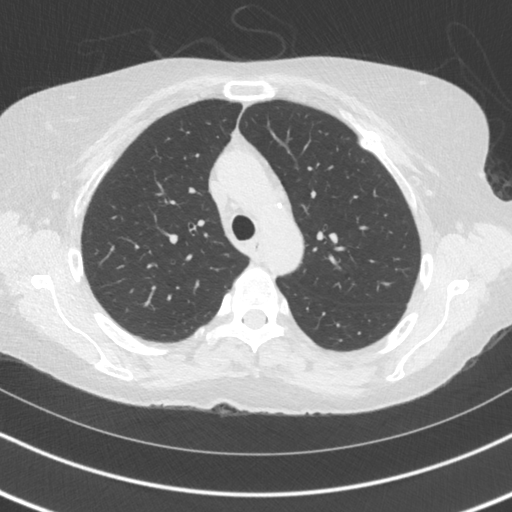
[im 130/168  lung]
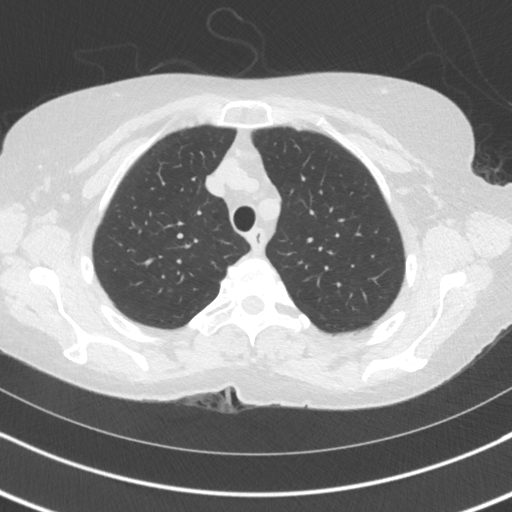
[im 143/168  lung]
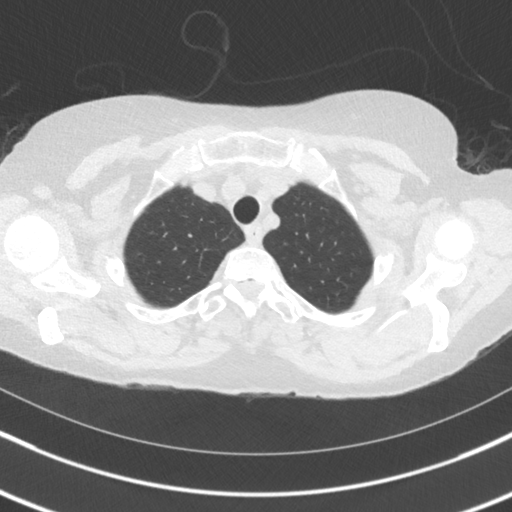
[im 155/168  lung]
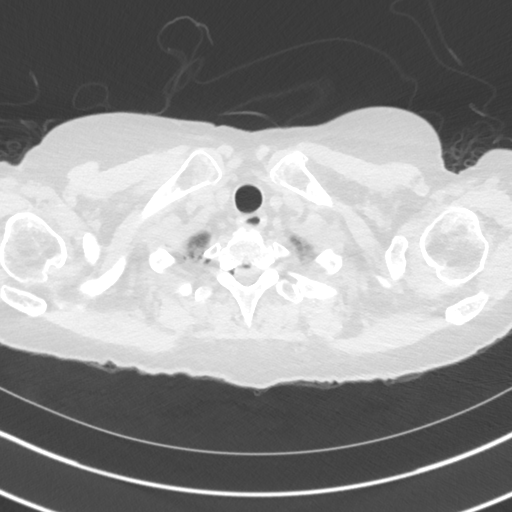

[Series 6: chest w/o 2mm st cor · coronal · non-contrast · 0.61mm/px · 3 of 134 slices shown]
[im 27/134  lung]
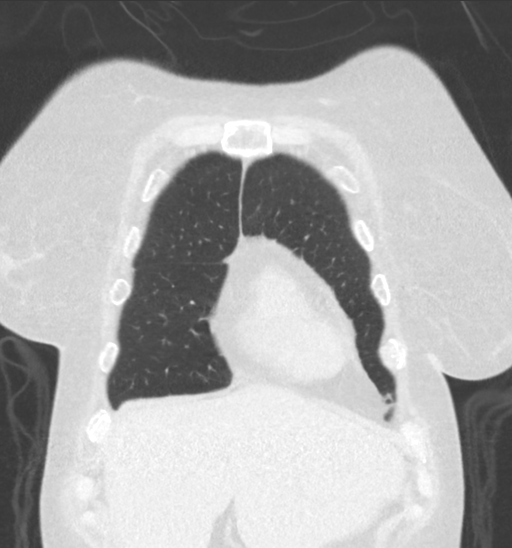
[im 54/134  lung]
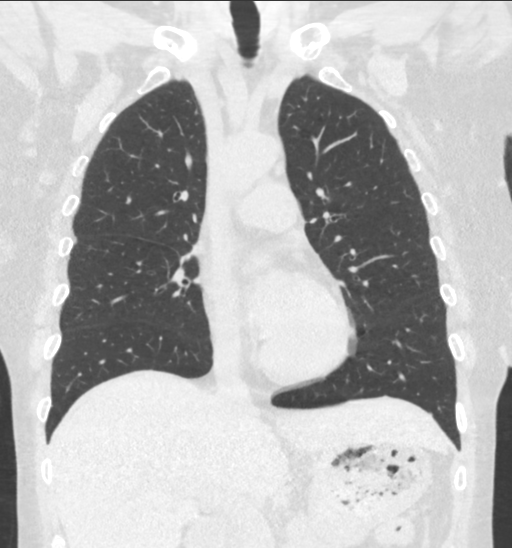
[im 80/134  lung]
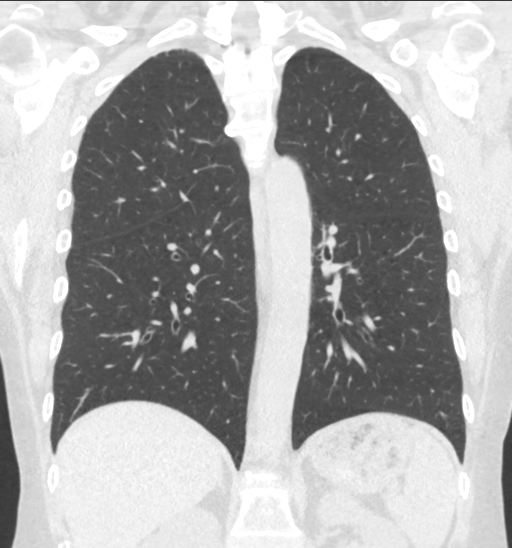

[15 of 36 positions shown; findings below may reference images not displayed]

FINDINGS: Cardiovascular: No significant vascular findings. Normal heart size.
No pericardial effusion. No thoracic aortic aneurysm. Coronary,
aortic arch, and branch vessel atherosclerotic vascular disease.

Mediastinum/Nodes: No enlarged mediastinal or axillary lymph nodes.
Thyroid gland, trachea, and esophagus demonstrate no significant
findings.

Lungs/Pleura: Lungs are clear. No pleural effusion or pneumothorax.

Upper Abdomen: No acute abnormality.

Musculoskeletal: Subacute to chronic nondisplaced fracture of the
left anterior third rib with somewhat exuberant callus formation.
This accounts for the nodular density seen on recent chest x-ray.
Small amount of postsurgical epidural air in the upper thoracic
spine.
IMPRESSION: 1. Subacute to chronic nondisplaced fracture of the left anterior
third rib with somewhat exuberant callus formation. This accounts
for the nodular density seen on recent chest x-ray. No lung nodule.
2. Aortic Atherosclerosis ([id number]).

## 2021-12-07 ENCOUNTER — Other Ambulatory Visit (HOSPITAL_COMMUNITY): Payer: Self-pay | Admitting: Internal Medicine

## 2021-12-07 ENCOUNTER — Ambulatory Visit (HOSPITAL_COMMUNITY)
Admission: RE | Admit: 2021-12-07 | Discharge: 2021-12-07 | Disposition: A | Discharge status: Home / Self Care | Payer: Medicare HMO | Source: Ambulatory Visit | Attending: Neurology | Admitting: Neurology

## 2021-12-07 DIAGNOSIS — M8588 Other specified disorders of bone density and structure, other site: Secondary | ICD-10-CM | POA: Diagnosis not present

## 2021-12-07 DIAGNOSIS — M545 Low back pain, unspecified: Secondary | ICD-10-CM | POA: Insufficient documentation

## 2021-12-07 DIAGNOSIS — Z1231 Encounter for screening mammogram for malignant neoplasm of breast: Secondary | ICD-10-CM

## 2021-12-17 DIAGNOSIS — M4316 Spondylolisthesis, lumbar region: Secondary | ICD-10-CM | POA: Diagnosis not present

## 2021-12-17 DIAGNOSIS — M48061 Spinal stenosis, lumbar region without neurogenic claudication: Secondary | ICD-10-CM | POA: Diagnosis not present

## 2021-12-17 DIAGNOSIS — M1991 Primary osteoarthritis, unspecified site: Secondary | ICD-10-CM | POA: Diagnosis not present

## 2021-12-17 DIAGNOSIS — G894 Chronic pain syndrome: Secondary | ICD-10-CM | POA: Diagnosis not present

## 2021-12-17 DIAGNOSIS — M5136 Other intervertebral disc degeneration, lumbar region: Secondary | ICD-10-CM | POA: Diagnosis not present

## 2021-12-17 DIAGNOSIS — E663 Overweight: Secondary | ICD-10-CM | POA: Diagnosis not present

## 2021-12-17 DIAGNOSIS — J329 Chronic sinusitis, unspecified: Secondary | ICD-10-CM | POA: Diagnosis not present

## 2021-12-31 ENCOUNTER — Other Ambulatory Visit (HOSPITAL_COMMUNITY): Payer: Self-pay | Admitting: Neurology

## 2021-12-31 ENCOUNTER — Ambulatory Visit (HOSPITAL_COMMUNITY)
Admission: RE | Admit: 2021-12-31 | Discharge: 2021-12-31 | Disposition: A | Discharge status: Home / Self Care | Payer: Medicare HMO | Source: Ambulatory Visit | Attending: Neurology | Admitting: Neurology

## 2021-12-31 DIAGNOSIS — I700 Atherosclerosis of aorta: Secondary | ICD-10-CM | POA: Diagnosis not present

## 2021-12-31 DIAGNOSIS — R109 Unspecified abdominal pain: Secondary | ICD-10-CM | POA: Diagnosis not present

## 2021-12-31 DIAGNOSIS — R9389 Abnormal findings on diagnostic imaging of other specified body structures: Secondary | ICD-10-CM | POA: Insufficient documentation

## 2021-12-31 LAB — POCT I-STAT CREATININE: Creatinine, Ser: 2.1 mg/dL — ABNORMAL HIGH (ref 0.44–1.00)

## 2021-12-31 MED ORDER — IOHEXOL 300 MG/ML  SOLN: 100.0000 mL | Freq: Once | INTRAMUSCULAR | Status: DC | PRN

## 2022-01-15 ENCOUNTER — Ambulatory Visit (HOSPITAL_COMMUNITY)
Admission: RE | Admit: 2022-01-15 | Discharge: 2022-01-15 | Disposition: A | Discharge status: Home / Self Care | Payer: Medicare HMO | Source: Ambulatory Visit | Attending: Internal Medicine | Admitting: Internal Medicine

## 2022-01-15 ENCOUNTER — Ambulatory Visit (HOSPITAL_COMMUNITY): Payer: Medicare HMO

## 2022-01-15 DIAGNOSIS — Z1231 Encounter for screening mammogram for malignant neoplasm of breast: Secondary | ICD-10-CM | POA: Insufficient documentation

## 2022-01-21 DIAGNOSIS — I10 Essential (primary) hypertension: Secondary | ICD-10-CM | POA: Diagnosis not present

## 2022-01-21 DIAGNOSIS — G894 Chronic pain syndrome: Secondary | ICD-10-CM | POA: Diagnosis not present

## 2022-01-21 DIAGNOSIS — J329 Chronic sinusitis, unspecified: Secondary | ICD-10-CM | POA: Diagnosis not present

## 2022-01-21 DIAGNOSIS — M545 Low back pain, unspecified: Secondary | ICD-10-CM | POA: Diagnosis not present

## 2022-01-23 DIAGNOSIS — I509 Heart failure, unspecified: Secondary | ICD-10-CM | POA: Diagnosis not present

## 2022-01-23 DIAGNOSIS — F419 Anxiety disorder, unspecified: Secondary | ICD-10-CM | POA: Diagnosis not present

## 2022-01-23 DIAGNOSIS — M810 Age-related osteoporosis without current pathological fracture: Secondary | ICD-10-CM | POA: Diagnosis not present

## 2022-01-23 DIAGNOSIS — E785 Hyperlipidemia, unspecified: Secondary | ICD-10-CM | POA: Diagnosis not present

## 2022-01-23 DIAGNOSIS — F439 Reaction to severe stress, unspecified: Secondary | ICD-10-CM | POA: Diagnosis not present

## 2022-01-23 DIAGNOSIS — J42 Unspecified chronic bronchitis: Secondary | ICD-10-CM | POA: Diagnosis not present

## 2022-01-23 DIAGNOSIS — E2740 Unspecified adrenocortical insufficiency: Secondary | ICD-10-CM | POA: Diagnosis not present

## 2022-01-23 DIAGNOSIS — D84821 Immunodeficiency due to drugs: Secondary | ICD-10-CM | POA: Diagnosis not present

## 2022-01-23 DIAGNOSIS — G40909 Epilepsy, unspecified, not intractable, without status epilepticus: Secondary | ICD-10-CM | POA: Diagnosis not present

## 2022-01-23 DIAGNOSIS — Z008 Encounter for other general examination: Secondary | ICD-10-CM | POA: Diagnosis not present

## 2022-01-23 DIAGNOSIS — R69 Illness, unspecified: Secondary | ICD-10-CM | POA: Diagnosis not present

## 2022-01-23 DIAGNOSIS — E039 Hypothyroidism, unspecified: Secondary | ICD-10-CM | POA: Diagnosis not present

## 2022-01-25 ENCOUNTER — Telehealth: Payer: Self-pay | Admitting: Cardiology

## 2022-01-25 NOTE — Telephone Encounter (Signed)
Recommend prompt clinic visit with Dr. Harrell Gave or APP with EKG at that time.  Please ask her to bring her blood pressure cuff as well as all of her pill bottles to the appointment.  Unable to adjust medications due to uncertain what she is presently taking.  If she has recurrent chest pain prior to office visit needs to present to emergency department.  Loel Dubonnet, NP

## 2022-01-25 NOTE — Telephone Encounter (Signed)
Call transferred to RN as a stat  143-93- Saturday 20th 166/93- Saturday 20th 129-94 Friday 19th  133/91- Friday 19th 158/96- Friday 19th  Chest pain, dizzy. Patient had a mammogram a few weeks but got dizzy in the middle of it. BP on the phone is 170/95, 166/111. Chest pain not active at the moment. History of stroke 1 year ago today. At home health visit Saturday patient realized she has not been on a blood pressure medication in a year. Confirmed she is not taking Carvedilol and believes it was stopped when she was in hospital 1 year ago with a stroke. Home health nurse told her she was possibly in A. Fib.  No active chest pain- routing to Laurann Montana, NP to advise. Informed patient that if her chest pain returned prior to [place] calling her back to report to the ED and she is agreeable with that plan.

## 2022-01-25 NOTE — Telephone Encounter (Signed)
Pt c/o BP issue: STAT if pt c/o blurred vision, one-sided weakness or slurred speech  1. What are your last 5 BP readings?  888-75- Saturday 20th 166/93- Saturday 20th 129-94 Friday 19th  133/91- Friday 19th 158/96- Friday 19th  2. Are you having any other symptoms (ex. Dizziness, headache, blurred vision, passed out)? Headache, Dizziness, some palpations, some arm pain and some chest pain   3. What is your BP issue? Pt stated her bp has been going been like this for a couple of weeks now.  She stated she has been under some stress with her daughter    Best number [phone]

## 2022-01-25 NOTE — Telephone Encounter (Signed)
Left message for patient to call back        "Recommend prompt clinic visit with Dr. Harrell Gave or APP with EKG at that time.  Please ask her to bring her blood pressure cuff as well as all of her pill bottles to the appointment.   Unable to adjust medications due to uncertain what she is presently taking.   If she has recurrent chest pain prior to office visit needs to present to emergency department.   Loel Dubonnet, NP "

## 2022-01-26 ENCOUNTER — Telehealth: Payer: Self-pay | Admitting: Cardiology

## 2022-01-26 NOTE — Telephone Encounter (Signed)
Pt returning nurse's call. Please advise

## 2022-01-26 NOTE — Telephone Encounter (Signed)
RN returned call to patient to make appointment, but scheduling team got her scheduled for Friday with Laurann Montana, NP      ""Recommend prompt clinic visit with Dr. Harrell Gave or APP with EKG at that time.  Please ask her to bring her blood pressure cuff as well as all of her pill bottles to the appointment.   Unable to adjust medications due to uncertain what she is presently taking.   If she has recurrent chest pain prior to office visit needs to present to emergency department.   Loel Dubonnet, NP ""

## 2022-01-26 NOTE — Telephone Encounter (Signed)
Left message for patient to call back  

## 2022-01-29 ENCOUNTER — Ambulatory Visit (HOSPITAL_BASED_OUTPATIENT_CLINIC_OR_DEPARTMENT_OTHER): Payer: Medicare HMO | Admitting: Family

## 2022-01-29 ENCOUNTER — Encounter (HOSPITAL_BASED_OUTPATIENT_CLINIC_OR_DEPARTMENT_OTHER): Payer: Self-pay | Admitting: Family

## 2022-01-29 VITALS — BP 136/84 | HR 75 | Ht 65.5 in | Wt 175.0 lb

## 2022-01-29 DIAGNOSIS — R7309 Other abnormal glucose: Secondary | ICD-10-CM | POA: Diagnosis not present

## 2022-01-29 DIAGNOSIS — R079 Chest pain, unspecified: Secondary | ICD-10-CM | POA: Diagnosis not present

## 2022-01-29 DIAGNOSIS — I10 Essential (primary) hypertension: Secondary | ICD-10-CM | POA: Diagnosis not present

## 2022-01-29 DIAGNOSIS — I700 Atherosclerosis of aorta: Secondary | ICD-10-CM | POA: Diagnosis not present

## 2022-01-29 DIAGNOSIS — E785 Hyperlipidemia, unspecified: Secondary | ICD-10-CM

## 2022-01-29 DIAGNOSIS — Z8673 Personal history of transient ischemic attack (TIA), and cerebral infarction without residual deficits: Secondary | ICD-10-CM | POA: Diagnosis not present

## 2022-01-29 MED ORDER — AMLODIPINE BESYLATE 5 MG PO TABS: 5.0000 mg | ORAL_TABLET | Freq: Every day | ORAL | 2 refills | Status: DC

## 2022-01-29 MED ORDER — NITROGLYCERIN 0.4 MG SL SUBL: 0.4000 mg | SUBLINGUAL_TABLET | SUBLINGUAL | 4 refills | Status: DC | PRN

## 2022-01-29 NOTE — Progress Notes (Unsigned)
Office Visit    Patient Name: [name] Date of Encounter: 01/29/2022  PCP:  [employer], [place]  Cardiologist:  Buford Dresser, MD  Advanced Practice Provider:  No care team member to display Electrophysiologist:  None      Chief Complaint    [name] is a 56 y.o. female with a hx of CVA, hypertension, hyperlipidemia, prior tobacco abuse, Addison's disease presents today for chest pain   Past Medical History    Past Medical History:  Diagnosis Date   Addison's disease ([place])    Addison's disease ([place])    Adrenal insufficiency ([place])    diagnosed 2012   Aneurysm ([place])    Anxiety    Arthritis    Astigmatism    CAD (coronary artery disease)    Cath 2008 EF normal. RCA 50-60, Septal 50%. Myoview 3/12: EF 53% normal perfusion   Cardiac arrest ([place])    2/2 adissonian crisis   Cardiomyopathy    resolved   Chest pain    chronicc   CHF (congestive heart failure) (HCC)    Chronic back pain    Chronic diarrhea    Concussion    sept 28th 2014   Family history of breast cancer    Family history of colon cancer    Gastroparesis    GERD (gastroesophageal reflux disease)    Headache    migraines   HTN (hypertension)    Hyperlipidemia    Hypothyroidism    Mitral valve prolapse    Nondiabetic gastroparesis    PONV (postoperative nausea and vomiting)    Pre-diabetes    QT prolongation    Stroke [facility])    possible stroke 01/2021   Tobacco abuse    down to 2 cigarettes per day   Vertigo    Past Surgical History:  Procedure Laterality Date   ABDOMINAL HYSTERECTOMY     CARDIAC CATHETERIZATION  03/07/2007   showed 60% lesion in the right coronary artery   CHOLECYSTECTOMY     COLONOSCOPY     LEFT HEART CATHETERIZATION WITH CORONARY ANGIOGRAM N/A 04/13/2012   Procedure: LEFT HEART CATHETERIZATION WITH CORONARY ANGIOGRAM;  Surgeon: Hillary Bow, MD;  Location: [facility] CATH LAB;  Service: Cardiovascular;  Laterality:  N/A;   LUMBAR LAMINECTOMY/DECOMPRESSION MICRODISCECTOMY Left 12/20/2018   Procedure: Left Lumbar Three-Four Lumbar Laminotomy and Foraminotomy, Lumbar Four-Five Laminotomy and Foraminotomy with Microdiscectomy and Resection of Synovial Cyst;  Surgeon: Jovita Gamma, MD;  Location: [place];  Service: Neurosurgery;  Laterality: Left;  Left Lumbar 3-4 Lumbar laminotomy, foraminotomy, possible microdiscectomy with possible resection of synovial cyst   SPINE SURGERY     VARICOSE VEIN SURGERY     VESICOVAGINAL FISTULA CLOSURE W/ TAH      Allergies  Allergies  Allergen Reactions   Bee Venom Anaphylaxis   Doxycycline Other (See Comments) and Nausea Only    Due to Pre-Existing conditions involved with stomach, patient does not take the following medication   Erythromycin Other (See Comments) and Nausea And Vomiting    Due to Pre-Existing conditions involved with stomach, patient does not take the following medication   Ibuprofen Nausea Only    gastroparesis    Penicillins Anaphylaxis and Swelling    Has patient had a PCN reaction causing immediate rash, facial/tongue/throat swelling, SOB or lightheadedness with hypotension: Yes Has patient had a PCN reaction causing severe rash involving mucus membranes or skin necrosis: Yes Has patient had a PCN reaction that  required hospitalization No Has patient had a PCN reaction occurring within the last 10 years: No If all of the above answers are "NO", then may proceed with Cephalosporin use.    Sulfa Antibiotics Hives and Itching   Cat Hair Extract Hives, Itching and Swelling    SWELLING REACTION UNSPECIFIED    Dust Mite Extract Hives and Swelling   Tramadol Diarrhea and Nausea And Vomiting    Other reaction(s): Unknown   Sulfamethoxazole-Trimethoprim     Other reaction(s): Unknown   Morphine And Related Itching    History of Present Illness    [name] is a 56 y.o. female with a hx of hypertension, hyperlipidemia, CVA 01/2021, prior  tobacco use, Addison's, neuropathy last seen 04/10/2021.  [name], grade 1 diastolic dysfunction.  [place] 01/08/2020 with breast attenuation but no ischemia, low risk study.  Echo 10/2020 EF 55 to 25%, grade 1 diastolic dysfunction, normal RV function, no significant valvular disease, normal wall motion.  She was started on Lasix due to lower extremity edema.  Of note she was on steroids at the time due to her Addison's disease.  Hospitalized 01/2021 due to acute CTA and found to have "unimpressive " punctate acute or subacute infarction.  EEG with left temporal epileptic form discharges.  She was transitioned from aspirin to Plavix.   Prior patient of Dr. Debara Pickett who established Dr. Harrell Gave 04/2021.  At that time she was recovering from lumbar fusion.  She was doing overall well from a cardiac perspective and no changes were made.  She presents today for follow-up.  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  She has 2 other children and grandchildren who also live locally.  She notes her blood pressure has been running high at home.  Wonders whether she needs additional antihypertensive agents.  However of note May 12 she went for mammogram her blood pressure dropped to 88/59 associated with dizziness. Has had occasional episodes of midsternal chest discomfort both at rest and with activity over the past couple months.  She is following with neurology, Dr. Merlene Laughter,  due to neuropathic pain in her legs. He ordered abdominal pelvis CT due to chronic right lower quadrant abdominal pain which was performed 12/31/21 with mild bilateral renal cortical scarring and no hydronephrosis, positive intra and extra hepatic biliary ductal dilation and prominent soft tissue at ampulla and distal obstructing lesion recommended for correlation with liver enzymes and possible MRI/MRCP or ERCP. Prior to study her creatinine was  2.1 with baseline of 1.39.   EKGs/Labs/Other Studies Reviewed:   The following studies were reviewed today:  CT abd pelvis 12/31/21 IMPRESSION: No acute intra-abdominal pathology identified. No definite radiographic explanation for the patient's reported symptoms.   Progressive intra and extrahepatic biliary ductal dilation. While this may represent post cholecystectomy change, this appears progressive since prior examination of 01/25/2021. Moreover, there is prominent soft tissue seen at the ampulla and a distal obstructing lesion such as ampullary stenosis or ampullary mass is not excluded. Correlation with liver enzymes is recommended to exclude an obstructive process. MRI/MRCP or ERCP examination may be more helpful for further evaluation.   Moderate stool burden without evidence of obstruction.   Aortic Atherosclerosis (ICD10-I70.0).  None    EKG:  EKG is  ordered today.  The ekg ordered today demonstrates NSR 75 bpmwith no acute ST/T wave changes.   Recent Labs: 03/16/2021: BUN 24; Hemoglobin 14.7; Platelets  391; Potassium 4.2; Sodium 138 12/31/2021: Creatinine, Ser 2.10  Recent Lipid Panel    Component Value Date/Time   CHOL 137 01/26/2021 0410   TRIG 163 (H) 01/26/2021 0410   HDL 50 01/26/2021 0410   CHOLHDL 2.7 01/26/2021 0410   VLDL 33 01/26/2021 0410   LDLCALC 54 01/26/2021 0410    Home Medications   Current Meds  Medication Sig   albuterol (VENTOLIN HFA) 108 (90 Base) MCG/ACT inhaler Inhale 1-2 puffs into the lungs every 6 (six) hours as needed for wheezing or shortness of breath.    alendronate (FOSAMAX) 70 MG tablet Take 70 mg by mouth every Monday.   ALPRAZolam (XANAX) 0.5 MG tablet 1 tablet   aspirin EC 81 MG tablet 1 tablet   atorvastatin (LIPITOR) 40 MG tablet Take 1 tablet (40 mg total) by mouth daily.   buPROPion (ZYBAN) 150 MG 12 hr tablet 1 tablet in the morning   carvedilol (COREG) 6.25 MG tablet Take 1 tablet (6.25 mg total) by mouth 2 (two)  times daily with a meal.   cholecalciferol (VITAMIN D) 1000 UNITS tablet Take 1,000 Units by mouth in the morning.   cyclobenzaprine (FLEXERIL) 10 MG tablet Take 10 mg by mouth 3 (three) times daily as needed.   EPINEPHrine 0.3 mg/0.3 mL IJ SOAJ injection Inject 0.3 mg into the muscle as needed for anaphylaxis.   gabapentin (NEURONTIN) 300 MG capsule Take 300 mg by mouth daily.   gabapentin (NEURONTIN) 600 MG tablet Take 600 mg by mouth 4 (four) times daily as needed (pain).   hydrocortisone (CORTEF) 10 MG tablet Take 10-20 mg by mouth See admin instructions. Takes 20 mg by mouth in the morning and 10 mg at night   levothyroxine (SYNTHROID) 75 MCG tablet 1 tablet in the morning on an empty stomach   meclizine (ANTIVERT) 25 MG tablet 1 or 2 tabs PO q8h prn dizziness   nitroGLYCERIN (NITROSTAT) 0.4 MG SL tablet Place 1 tablet (0.4 mg total) under the tongue every 5 (five) minutes as needed for chest pain.   ondansetron (ZOFRAN) 4 MG tablet Take 4 mg by mouth every 6 (six) hours as needed for nausea or vomiting.   oxyCODONE 20 MG TABS Take 1 tablet (20 mg total) by mouth every 6 (six) hours as needed for severe pain ((score 7 to 10)).   zolpidem (AMBIEN) 10 MG tablet Take 10 mg by mouth at bedtime.     Review of Systems      All other systems reviewed and are otherwise negative except as noted above.  Physical Exam    VS:  BP 136/84   Pulse 75   Ht 5' 5.5" (1.664 m)   Wt 175 lb (79.4 kg)   BMI 28.68 kg/m  , BMI Body mass index is 28.68 kg/m.  Wt Readings from Last 3 Encounters:  01/29/22 175 lb (79.4 kg)  04/10/21 177 lb 8 oz (80.5 kg)  03/18/21 179 lb (81.2 kg)     GEN: Well nourished, well developed, in no acute distress. HEENT: normal. Neck: Supple, no JVD, carotid bruits, or masses. Cardiac: RRR, no murmurs, rubs, or gallops. No clubbing, cyanosis, edema.  Radials/PT 2+ and equal bilaterally.  Respiratory:  Respirations regular and unlabored, clear to auscultation  bilaterally. GI: Soft, nontender, nondistended. MS: No deformity or atrophy. Skin: Warm and dry, no rash. Neuro:  Strength and sensation are intact. Psych: Normal affect.  Assessment & Plan    Chest pain in adult - EKG otday no  acute ST/T wave changes. Both at rest and with activity. Plan for ischemic evaluation. Update BMP today. If GFR >35 - plan for cardiac CTA. If <35 - plan for PET. Addendum 01/31/22: Cardiac CTA ordered.   History of CVA - Follows with neurology. Continue optimal BP, lipid, glucose control. Continue Aspirin, Atorvastatin. Unclear whether she is taking Plavix - will request most recent neurology note. Heart healthy diet and regular cardiovascular exercise encouraged.    Aortic atherosclerosis - Continue Aspirin, statin. Ischemic evaluation as detailed above.   Hypothyroidism / Addison's disease- Managed by endocrinology.  AKI - 12/2021 creatinine 2.1. Update BMP today. If renal function remains decreased, refer to nephrology.   HLD, LDL goal <70 - Continue Atorvastatin '40mg'$  QD. Update direct LDL, CMP.   Neuropathy -Following with neurology. UPdate A1c today to rule out DM2 as etiology.   HTN - BP not at goal. Continue Coreg 6.'25mg'$  BID. Start Amlodipine '5mg'$  QD. Unable to utilize thiazide diuretic or ARB due to renal function.      Disposition: Follow up in 1 month(s) with Buford Dresser, MD or APP.  Signed, Loel Dubonnet, NP 01/29/2022, 2:53 PM [place]

## 2022-01-29 NOTE — Patient Instructions (Signed)
Medication Instructions:  Your physician has recommended you make the following change in your medication:   CONTINUE Carvedilol 6.'25mg'$  twice daily *try to take your two doses 12 hours apart  START Amlodipine '5mg'$  daily   *If you need a refill on your cardiac medications before your next appointment, please call your pharmacy*   Lab Work: Your physician recommends that you return for lab work today: CMP, A1c, direct LDL  If you have labs (blood work) drawn today and your tests are completely normal, you will receive your results only by: [place] (if you have MyChart) OR A paper copy in the mail If you have any lab test that is abnormal or we need to change your treatment, we will call you to review the results.   Testing/Procedures: Your EKG today shows normal sinus rhythm  We will consider either a cardiac CTA if your kidney function is normal or a stress test/PET if your kidney function is abnormal. We will let you know on Monday!   Follow-Up: At [facility], you and your health needs are our priority.  As part of our continuing mission to provide you with exceptional heart care, we have created designated Provider Care Teams.  These Care Teams include your primary Cardiologist (physician) and Advanced Practice Providers (APPs -  Physician Assistants and Nurse Practitioners) who all work together to provide you with the care you need, when you need it.  We recommend signing up for the patient portal called "MyChart".  Sign up information is provided on this After Visit Summary.  MyChart is used to connect with patients for Virtual Visits (Telemedicine).  Patients are able to view lab/test results, encounter notes, upcoming appointments, etc.  Non-urgent messages can be sent to your provider as well.   To learn more about what you can do with MyChart, go to [email or web address].    Your next appointment:   1 month(s)  The format for your next appointment:   In  Person  Provider:   Buford Dresser, MD or Loel Dubonnet, NP     Other Instructions  Heart Healthy Diet Recommendations: A low-salt diet is recommended. Meats should be grilled, baked, or boiled. Avoid fried foods. Focus on lean protein sources like fish or chicken with vegetables and fruits. The American Heart Association is a Microbiologist!  American Heart Association Diet and Lifeystyle Recommendations   Exercise recommendations: The American Heart Association recommends 150 minutes of moderate intensity exercise weekly. Try 30 minutes of moderate intensity exercise 4-5 times per week. This could include walking, jogging, or swimming.    Important Information About Sugar

## 2022-01-30 LAB — COMPREHENSIVE METABOLIC PANEL
ALT: 32 IU/L (ref 0–32)
AST: 26 IU/L (ref 0–40)
Albumin/Globulin Ratio: 1.6 (ref 1.2–2.2)
Albumin: 4.4 g/dL (ref 3.8–4.9)
Alkaline Phosphatase: 76 IU/L (ref 44–121)
BUN/Creatinine Ratio: 10 (ref 9–23)
BUN: 13 mg/dL (ref 6–24)
Bilirubin Total: <0.2 mg/dL (ref 0.0–1.2)
CO2: 21 mmol/L (ref 20–29)
Calcium: 9.8 mg/dL (ref 8.7–10.2)
Chloride: 104 mmol/L (ref 96–106)
Creatinine, Ser: 1.29 mg/dL — ABNORMAL HIGH (ref 0.57–1.00)
Globulin, Total: 2.8 g/dL (ref 1.5–4.5)
Glucose: 86 mg/dL (ref 70–99)
Potassium: 4.8 mmol/L (ref 3.5–5.2)
Sodium: 143 mmol/L (ref 134–144)
Total Protein: 7.2 g/dL (ref 6.0–8.5)
eGFR: 48 mL/min/{1.73_m2} — ABNORMAL LOW (ref >59)

## 2022-01-30 LAB — HEMOGLOBIN A1C
Est. average glucose Bld gHb Est-mCnc: 120 mg/dL
Hgb A1c MFr Bld: 5.8 % — ABNORMAL HIGH (ref 4.8–5.6)

## 2022-01-30 LAB — LDL CHOLESTEROL, DIRECT: LDL Direct: 52 mg/dL (ref 0–99)

## 2022-02-02 ENCOUNTER — Telehealth (HOSPITAL_BASED_OUTPATIENT_CLINIC_OR_DEPARTMENT_OTHER): Payer: Self-pay

## 2022-02-02 DIAGNOSIS — R072 Precordial pain: Secondary | ICD-10-CM

## 2022-02-02 MED ORDER — METOPROLOL TARTRATE 100 MG PO TABS: 100.0000 mg | ORAL_TABLET | Freq: Once | ORAL | 0 refills | Status: DC

## 2022-02-02 NOTE — Telephone Encounter (Addendum)
RN called and reviewed results with patient and reviewed CTA instructions and sent them via mychart message with patient. All questions answered and orders placed.    ----- Message from Loel Dubonnet, NP sent at 01/30/2022 11:22 AM EDT ----- Kidney function has returned to her baseline. Good result! Normal liver, electrolytes. A1  shows prediabetes - recommend increase physical activity and dietary changes. LDL (bad cholesterol) at goal which is great!  As kidney function has returned to her baseline recommend ischemic evaluation with cardiac CTA.  [name]-please order cardiac CTA and educate patient on preprocedure instructions.

## 2022-02-11 ENCOUNTER — Encounter (HOSPITAL_BASED_OUTPATIENT_CLINIC_OR_DEPARTMENT_OTHER): Payer: Self-pay

## 2022-02-11 DIAGNOSIS — I10 Essential (primary) hypertension: Secondary | ICD-10-CM

## 2022-02-14 IMAGING — CR DG LUMBAR SPINE 2-3V
3 series · 3 of 3 positions shown · non-contrast
Comparison: Prior lumbar spine radiographs 03/18/2021 and
04/28/2021

CLINICAL DATA: Unspecified lower back pain

EXAM:
LUMBAR SPINE - 2-3 VIEW

[t  ap]
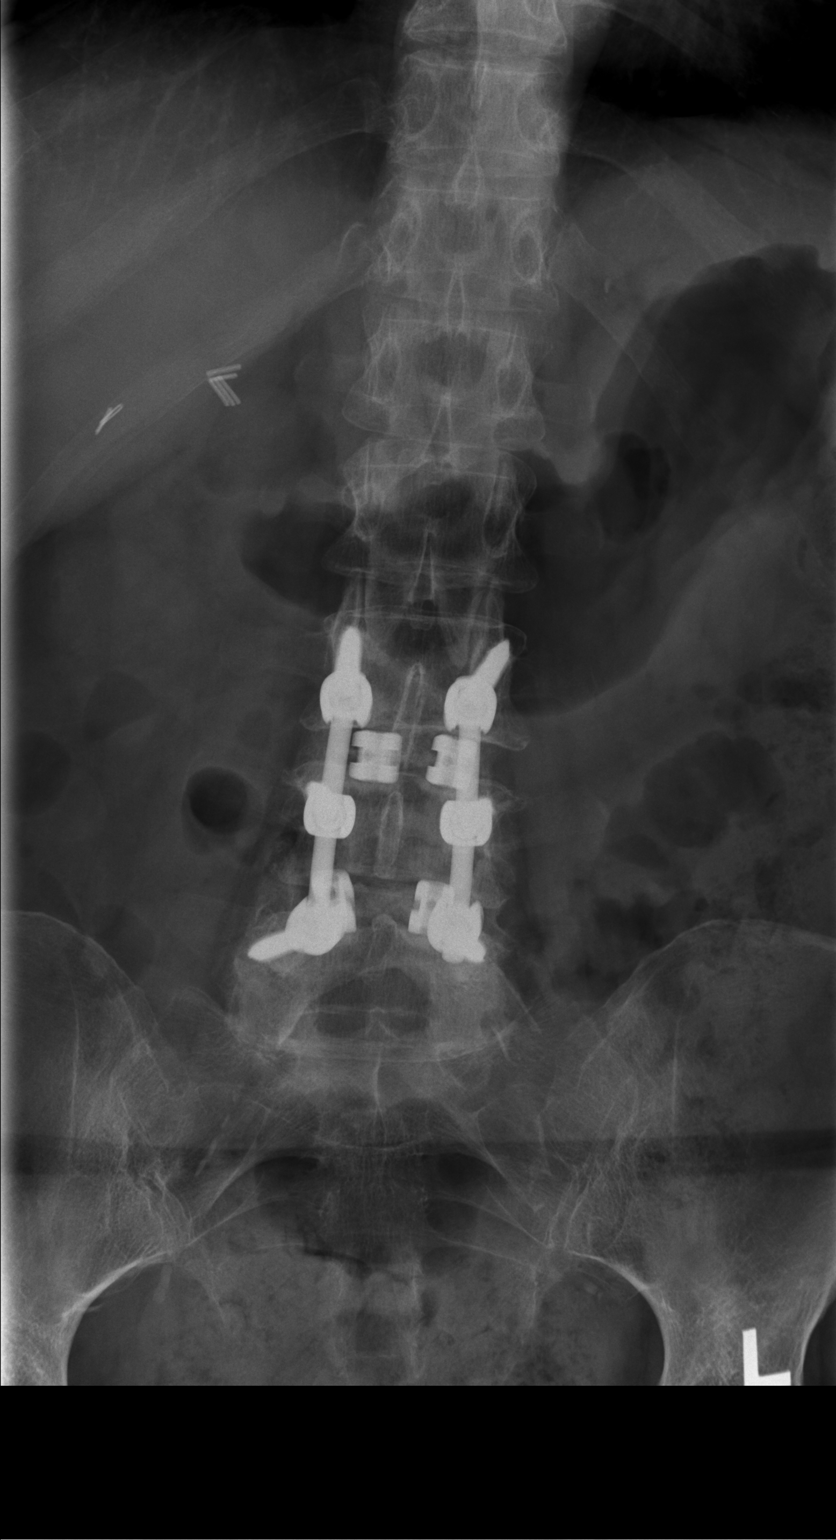

[t  lateral]
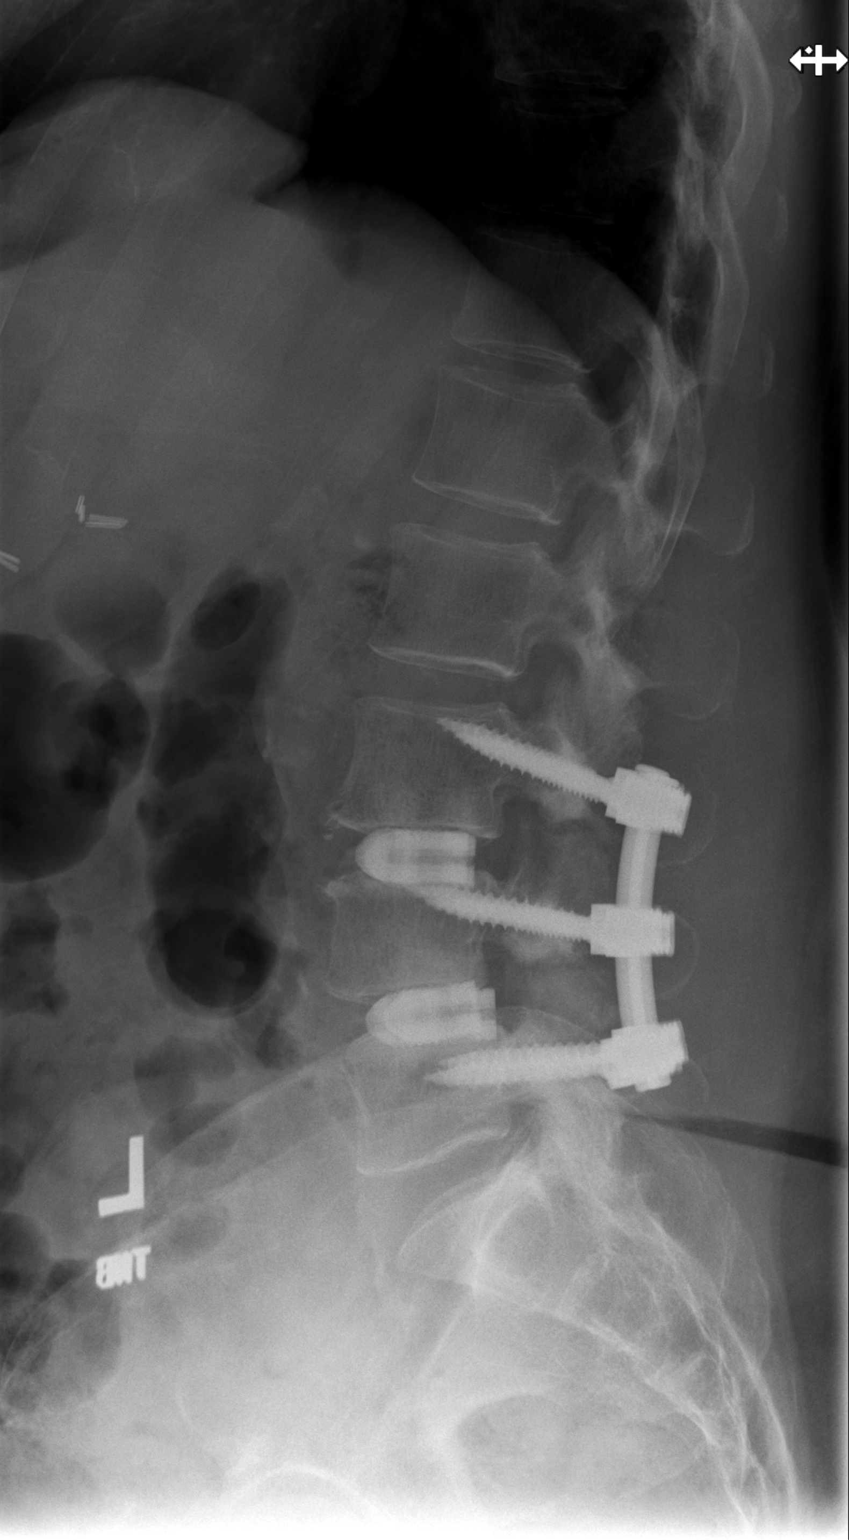

[t spot lateral]
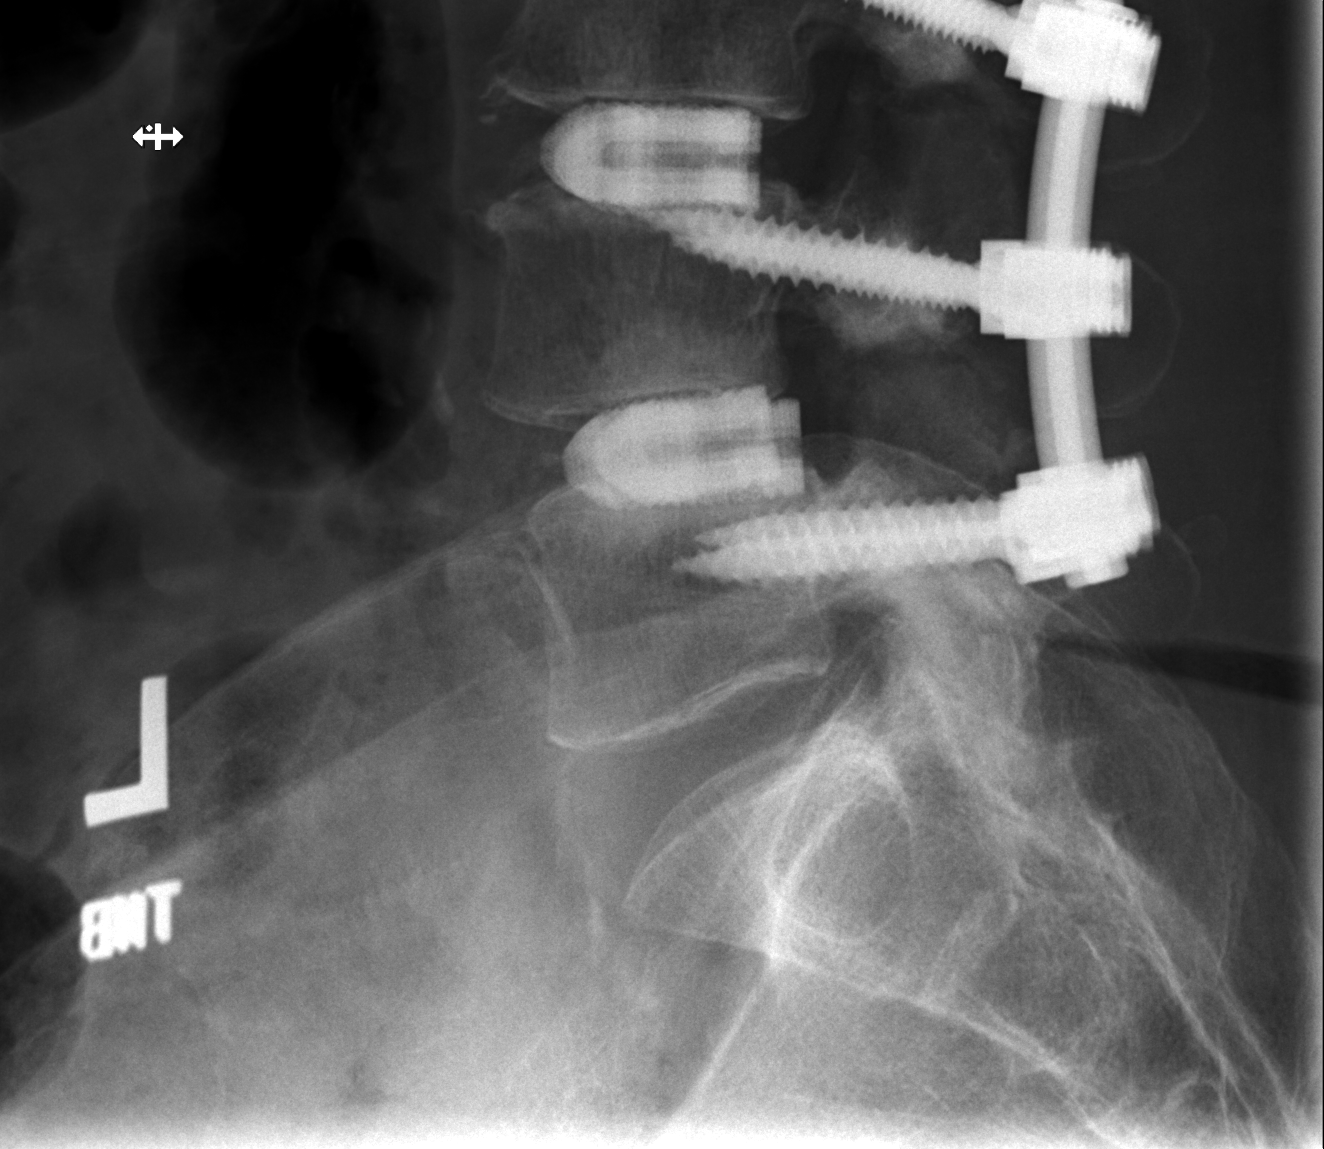

[3 of 3 positions shown; findings below may reference images not displayed]

FINDINGS: Surgical changes of prior L3-L5 posterior lumbar interbody fusion
with interbody cages in place. No evidence of hardware complication
or change in alignment. No evidence of fracture. Vertebral body
heights are maintained. Surgical clips in the right upper quadrant
suggest prior cholecystectomy. The visualized bowel gas pattern is
normal.
IMPRESSION: Surgical changes of prior L3-L5 PLIF without evidence of hardware
complication, progressive adjacent level disease or fracture.

## 2022-02-14 NOTE — Telephone Encounter (Signed)
Blood pressure not yet at goal of less than 130/80. Can we please ask her to increase Amlodipine to '10mg'$  daily and send an updated prescription in for her?  Thank you!! Loel Dubonnet, NP

## 2022-02-15 MED ORDER — AMLODIPINE BESYLATE 10 MG PO TABS: 10.0000 mg | ORAL_TABLET | Freq: Every day | ORAL | 2 refills | Status: DC

## 2022-02-17 DIAGNOSIS — M5416 Radiculopathy, lumbar region: Secondary | ICD-10-CM | POA: Diagnosis not present

## 2022-02-17 DIAGNOSIS — E271 Primary adrenocortical insufficiency: Secondary | ICD-10-CM | POA: Diagnosis not present

## 2022-02-17 DIAGNOSIS — R251 Tremor, unspecified: Secondary | ICD-10-CM | POA: Diagnosis not present

## 2022-02-17 DIAGNOSIS — G603 Idiopathic progressive neuropathy: Secondary | ICD-10-CM | POA: Diagnosis not present

## 2022-02-18 DIAGNOSIS — I639 Cerebral infarction, unspecified: Secondary | ICD-10-CM | POA: Diagnosis not present

## 2022-02-18 DIAGNOSIS — R69 Illness, unspecified: Secondary | ICD-10-CM | POA: Diagnosis not present

## 2022-02-18 DIAGNOSIS — T50905A Adverse effect of unspecified drugs, medicaments and biological substances, initial encounter: Secondary | ICD-10-CM | POA: Diagnosis not present

## 2022-02-18 DIAGNOSIS — M48061 Spinal stenosis, lumbar region without neurogenic claudication: Secondary | ICD-10-CM | POA: Diagnosis not present

## 2022-02-18 DIAGNOSIS — R55 Syncope and collapse: Secondary | ICD-10-CM | POA: Diagnosis not present

## 2022-02-18 DIAGNOSIS — Z6828 Body mass index (BMI) 28.0-28.9, adult: Secondary | ICD-10-CM | POA: Diagnosis not present

## 2022-02-18 DIAGNOSIS — E271 Primary adrenocortical insufficiency: Secondary | ICD-10-CM | POA: Diagnosis not present

## 2022-02-18 DIAGNOSIS — M545 Low back pain, unspecified: Secondary | ICD-10-CM | POA: Diagnosis not present

## 2022-02-18 DIAGNOSIS — F1120 Opioid dependence, uncomplicated: Secondary | ICD-10-CM | POA: Diagnosis not present

## 2022-02-18 DIAGNOSIS — G894 Chronic pain syndrome: Secondary | ICD-10-CM | POA: Diagnosis not present

## 2022-02-18 DIAGNOSIS — I10 Essential (primary) hypertension: Secondary | ICD-10-CM | POA: Diagnosis not present

## 2022-02-22 ENCOUNTER — Telehealth (HOSPITAL_COMMUNITY): Payer: Self-pay | Admitting: Emergency Medicine

## 2022-02-22 NOTE — Telephone Encounter (Signed)
Reaching out to patient to offer assistance regarding upcoming cardiac imaging study; pt verbalizes understanding of appt date/time, parking situation and where to check in, pre-test NPO status and medications ordered, and verified current allergies; name and call back number provided for further questions should they arise Joy Bond RN Navigator Cardiac Imaging [facility] [phone] office [phone] cell  '100mg'$  metoprolol tartrate R arm better for IV  Arrival 1130

## 2022-02-23 ENCOUNTER — Encounter (HOSPITAL_COMMUNITY): Payer: Self-pay

## 2022-02-23 ENCOUNTER — Ambulatory Visit (HOSPITAL_COMMUNITY)
Admission: RE | Admit: 2022-02-23 | Discharge: 2022-02-23 | Disposition: A | Discharge status: Home / Self Care | Payer: Medicare HMO | Source: Ambulatory Visit | Attending: Family | Admitting: Family

## 2022-02-23 DIAGNOSIS — R072 Precordial pain: Secondary | ICD-10-CM

## 2022-02-23 MED ORDER — NITROGLYCERIN 0.4 MG SL SUBL: 0.8000 mg | SUBLINGUAL_TABLET | Freq: Once | SUBLINGUAL | Status: DC

## 2022-02-23 NOTE — Progress Notes (Signed)
Pt arrived for CT Heart scan. BP 81/56 HR 57 NSR. Recheck 81/62. Pt premedicated with Metoprolol '100mg'$  PO. Pt states that she is dizzy and that she been dealing with symptomatic hypotension for several week. Medications have been adjusted and states that her SBP has been running low 100's when she checks it at home. This has been managed by Rushie Goltz, NP. Dr. Stanford Breed made aware and CT Heart scan will be rescheduled. Gordy Clement, RN made aware.

## 2022-02-24 DIAGNOSIS — R309 Painful micturition, unspecified: Secondary | ICD-10-CM | POA: Diagnosis not present

## 2022-02-24 DIAGNOSIS — R42 Dizziness and giddiness: Secondary | ICD-10-CM | POA: Diagnosis not present

## 2022-02-24 DIAGNOSIS — N3001 Acute cystitis with hematuria: Secondary | ICD-10-CM | POA: Diagnosis not present

## 2022-03-03 ENCOUNTER — Encounter (HOSPITAL_BASED_OUTPATIENT_CLINIC_OR_DEPARTMENT_OTHER): Payer: Self-pay | Admitting: Family

## 2022-03-03 ENCOUNTER — Other Ambulatory Visit (HOSPITAL_COMMUNITY): Payer: Self-pay

## 2022-03-03 ENCOUNTER — Ambulatory Visit (HOSPITAL_BASED_OUTPATIENT_CLINIC_OR_DEPARTMENT_OTHER): Payer: Medicare HMO | Admitting: Family

## 2022-03-03 VITALS — BP 126/88 | HR 73 | Ht 65.5 in | Wt 178.0 lb

## 2022-03-03 DIAGNOSIS — Z8673 Personal history of transient ischemic attack (TIA), and cerebral infarction without residual deficits: Secondary | ICD-10-CM

## 2022-03-03 DIAGNOSIS — E039 Hypothyroidism, unspecified: Secondary | ICD-10-CM

## 2022-03-03 DIAGNOSIS — I700 Atherosclerosis of aorta: Secondary | ICD-10-CM

## 2022-03-03 DIAGNOSIS — R5383 Other fatigue: Secondary | ICD-10-CM | POA: Diagnosis not present

## 2022-03-03 DIAGNOSIS — R079 Chest pain, unspecified: Secondary | ICD-10-CM | POA: Diagnosis not present

## 2022-03-03 MED ORDER — IVABRADINE HCL 5 MG PO TABS
ORAL_TABLET | ORAL | 0 refills | Status: DC
  Filled 2022-03-03: qty 2, fill #0

## 2022-03-03 MED ORDER — IVABRADINE HCL 5 MG PO TABS
ORAL_TABLET | ORAL | 0 refills | Status: DC
  Filled 2022-03-03: qty 2, 1d supply, fill #0

## 2022-03-03 NOTE — Progress Notes (Signed)
Office Visit    Patient Name: [name] Date of Encounter: 03/03/2022  PCP:  [employer], [place]  Cardiologist:  Buford Dresser, MD  Advanced Practice Provider:  No care team member to display Electrophysiologist:  None      Chief Complaint    [name] is a 56 y.o. female with a hx of CVA, hypertension, hyperlipidemia, prior tobacco abuse, Addison's disease presents today for chest pain   Past Medical History    Past Medical History:  Diagnosis Date   Addison's disease ([place])    Addison's disease ([place])    Adrenal insufficiency ([place])    diagnosed 2012   Aneurysm ([place])    Anxiety    Arthritis    Astigmatism    CAD (coronary artery disease)    Cath 2008 EF normal. RCA 50-60, Septal 50%. Myoview 3/12: EF 53% normal perfusion   Cardiac arrest ([place])    2/2 adissonian crisis   Cardiomyopathy    resolved   Chest pain    chronicc   CHF (congestive heart failure) (HCC)    Chronic back pain    Chronic diarrhea    Concussion    sept 28th 2014   Family history of breast cancer    Family history of colon cancer    Gastroparesis    GERD (gastroesophageal reflux disease)    Headache    migraines   HTN (hypertension)    Hyperlipidemia    Hypothyroidism    Mitral valve prolapse    Nondiabetic gastroparesis    PONV (postoperative nausea and vomiting)    Pre-diabetes    QT prolongation    Stroke [facility])    possible stroke 01/2021   Tobacco abuse    down to 2 cigarettes per day   Vertigo    Past Surgical History:  Procedure Laterality Date   ABDOMINAL HYSTERECTOMY     CARDIAC CATHETERIZATION  03/07/2007   showed 60% lesion in the right coronary artery   CHOLECYSTECTOMY     COLONOSCOPY     LEFT HEART CATHETERIZATION WITH CORONARY ANGIOGRAM N/A 04/13/2012   Procedure: LEFT HEART CATHETERIZATION WITH CORONARY ANGIOGRAM;  Surgeon: Hillary Bow, MD;  Location: [facility] CATH LAB;  Service: Cardiovascular;  Laterality:  N/A;   LUMBAR LAMINECTOMY/DECOMPRESSION MICRODISCECTOMY Left 12/20/2018   Procedure: Left Lumbar Three-Four Lumbar Laminotomy and Foraminotomy, Lumbar Four-Five Laminotomy and Foraminotomy with Microdiscectomy and Resection of Synovial Cyst;  Surgeon: Jovita Gamma, MD;  Location: [place];  Service: Neurosurgery;  Laterality: Left;  Left Lumbar 3-4 Lumbar laminotomy, foraminotomy, possible microdiscectomy with possible resection of synovial cyst   SPINE SURGERY     VARICOSE VEIN SURGERY     VESICOVAGINAL FISTULA CLOSURE W/ TAH      Allergies  Allergies  Allergen Reactions   Bee Venom Anaphylaxis   Doxycycline Other (See Comments) and Nausea Only    Due to Pre-Existing conditions involved with stomach, patient does not take the following medication   Erythromycin Other (See Comments) and Nausea And Vomiting    Due to Pre-Existing conditions involved with stomach, patient does not take the following medication   Ibuprofen Nausea Only    gastroparesis    Penicillins Anaphylaxis and Swelling    Has patient had a PCN reaction causing immediate rash, facial/tongue/throat swelling, SOB or lightheadedness with hypotension: Yes Has patient had a PCN reaction causing severe rash involving mucus membranes or skin necrosis: Yes Has patient had a PCN reaction that  required hospitalization No Has patient had a PCN reaction occurring within the last 10 years: No If all of the above answers are "NO", then may proceed with Cephalosporin use.    Sulfa Antibiotics Hives and Itching   Cat Hair Extract Hives, Itching and Swelling    SWELLING REACTION UNSPECIFIED    Dust Mite Extract Hives and Swelling   Tramadol Diarrhea and Nausea And Vomiting    Other reaction(s): Unknown   Sulfamethoxazole-Trimethoprim     Other reaction(s): Unknown    History of Present Illness    [name] is a 56 y.o. female with a hx of hypertension, hyperlipidemia, CVA 01/2021, prior tobacco use, Addison's, neuropathy  last seen 04/10/2021.  [name], grade 1 diastolic dysfunction.  [place] 01/08/2020 with breast attenuation but no ischemia, low risk study.  Echo 10/2020 EF 55 to 71%, grade 1 diastolic dysfunction, normal RV function, no significant valvular disease, normal wall motion.  She was started on Lasix due to lower extremity edema.  Of note she was on steroids at the time due to her Addison's disease.  Hospitalized 01/2021 due to acute CTA and found to have "unimpressive " punctate acute or subacute infarction.  EEG with left temporal epileptic form discharges.  She was transitioned from aspirin to Plavix.   Prior patient of Dr. Debara Pickett who established Dr. Harrell Gave 04/2021.  At that time she was recovering from lumbar fusion.  She was doing overall well from a cardiac perspective and no changes were made.  At visit 01/29/2022 cardiac CTA ordered due to chest pain.  She was also started on amlodipine for elevated blood pressure.  However when she presented for cardiac CTA she was found to be markedly hypotensive.  It was unable to be completed.  Urgent care visit 02/24/22 with UTI and dizziness consistent with vertigo.  She was treated with prednisone, Cipro, as needed meclizine.  She presents today for follow-up.  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  She has 2 other children and grandchildren who also live locally.  Does note that her dizziness improved with meclizine.  Amlodipine reduced to 5 mg by PCP but still with relative hypotension at home. Notes 5-6 days history of chest pain radiating through back most notable with certain positions like turning onto her side.  Discussed likely musculoskeletal.  She does caretake for her paraplegic daughter but does not recall any recent straining. Home BP cuff previously checked for accuracy and found to be within 2 points. Did have orthostatic vitals at PCP office.  Drinks four 16 oz bottles of water, one 12 oz Dr. Malachi Bonds.    EKGs/Labs/Other Studies Reviewed:   The following studies were reviewed today:  CT abd pelvis 12/31/21 IMPRESSION: No acute intra-abdominal pathology identified. No definite radiographic explanation for the patient's reported symptoms.   Progressive intra and extrahepatic biliary ductal dilation. While this may represent post cholecystectomy change, this appears progressive since prior examination of 01/25/2021. Moreover, there is prominent soft tissue seen at the ampulla and a distal obstructing lesion such as ampullary stenosis or ampullary mass is not excluded. Correlation with liver enzymes is recommended to exclude an obstructive process. MRI/MRCP or ERCP examination may be more helpful for further evaluation.   Moderate stool burden without evidence of obstruction.   Aortic Atherosclerosis (ICD10-I70.0).  None    EKG:  EKG is  ordered today.  The ekg ordered today demonstrates NSR  45 bpmwith no acute ST/T wave changes.   Recent Labs: 03/16/2021: Hemoglobin 14.7; Platelets 391 01/29/2022: ALT 32; BUN 13; Creatinine, Ser 1.29; Potassium 4.8; Sodium 143  Recent Lipid Panel    Component Value Date/Time   CHOL 137 01/26/2021 0410   TRIG 163 (H) 01/26/2021 0410   HDL 50 01/26/2021 0410   CHOLHDL 2.7 01/26/2021 0410   VLDL 33 01/26/2021 0410   LDLCALC 54 01/26/2021 0410   LDLDIRECT 52 01/29/2022 1533    Home Medications   Current Meds  Medication Sig   albuterol (VENTOLIN HFA) 108 (90 Base) MCG/ACT inhaler Inhale 1-2 puffs into the lungs every 6 (six) hours as needed for wheezing or shortness of breath.    alendronate (FOSAMAX) 70 MG tablet Take 70 mg by mouth every Monday.   ALPRAZolam (XANAX) 0.5 MG tablet 1 tablet   amLODipine (NORVASC) 10 MG tablet Take 1 tablet (10 mg total) by mouth daily. (Patient taking differently: Take 5 mg by mouth daily.)   aspirin EC 81 MG tablet 1 tablet   atorvastatin (LIPITOR)  40 MG tablet Take 1 tablet (40 mg total) by mouth daily.   buPROPion (ZYBAN) 150 MG 12 hr tablet 1 tablet in the morning   carvedilol (COREG) 6.25 MG tablet Take 1 tablet (6.25 mg total) by mouth 2 (two) times daily with a meal.   cholecalciferol (VITAMIN D) 1000 UNITS tablet Take 1,000 Units by mouth in the morning.   cyclobenzaprine (FLEXERIL) 10 MG tablet Take 10 mg by mouth 3 (three) times daily as needed.   EPINEPHrine 0.3 mg/0.3 mL IJ SOAJ injection Inject 0.3 mg into the muscle as needed for anaphylaxis.   gabapentin (NEURONTIN) 300 MG capsule Take 300 mg by mouth daily.   gabapentin (NEURONTIN) 600 MG tablet Take 600 mg by mouth 4 (four) times daily as needed (pain).   hydrocortisone (CORTEF) 10 MG tablet Take 10-20 mg by mouth See admin instructions. Takes 20 mg by mouth in the morning and 10 mg at night   levothyroxine (SYNTHROID) 75 MCG tablet 1 tablet in the morning on an empty stomach   meclizine (ANTIVERT) 25 MG tablet 1 or 2 tabs PO q8h prn dizziness   nitroGLYCERIN (NITROSTAT) 0.4 MG SL tablet Place 1 tablet (0.4 mg total) under the tongue every 5 (five) minutes as needed for chest pain.   ondansetron (ZOFRAN) 4 MG tablet Take 4 mg by mouth every 6 (six) hours as needed for nausea or vomiting.   oxyCODONE 20 MG TABS Take 1 tablet (20 mg total) by mouth every 6 (six) hours as needed for severe pain ((score 7 to 10)).   zolpidem (AMBIEN) 10 MG tablet Take 10 mg by mouth at bedtime.     Review of Systems      All other systems reviewed and are otherwise negative except as noted above.  Physical Exam    VS:  BP 126/88   Pulse 73   Ht 5' 5.5" (1.664 m)   Wt 178 lb (80.7 kg)   BMI 29.17 kg/m  , BMI Body mass index is 29.17 kg/m.  Wt Readings from Last 3 Encounters:  03/03/22 178 lb (80.7 kg)  01/29/22 175 lb (79.4 kg)  04/10/21 177 lb 8 oz (80.5 kg)     GEN: Well nourished, well developed, in no acute distress. HEENT: normal. Neck: Supple, no JVD, carotid bruits, or  masses. Cardiac: RRR, no murmurs, rubs, or gallops. No clubbing, cyanosis, edema.  Radials/PT 2+ and equal bilaterally.  Respiratory:  Respirations regular and unlabored, clear to auscultation bilaterally. GI: Soft, nontender, nondistended. MS: No deformity or atrophy. Skin: Warm and dry, no rash. Neuro:  Strength and sensation are intact. Psych: Normal affect.  Assessment & Plan    Chest pain in adult -proceed with cardiac CTA as previously ordered.  Due to previous hypotension with metoprolol we will instead have her take ivabradine 10 mg 2 hours prior to cardiac CTA. BMP today.   History of CVA - Follows with neurology. Continue optimal BP, lipid, glucose control. Continue Aspirin, Atorvastatin.   Aortic atherosclerosis - Continue Aspirin, statin. Ischemic evaluation as detailed above.   Hypothyroidism / Addison's disease- Managed by endocrinology.  HLD, LDL goal <70 -01/2022 direct LDL 52.  Continue Atorvastatin '40mg'$  QD.   Neuropathy -Following with neurology.   HTN -presumably her lightheadedness is due to hypotension.  To rule out alternate etiology collect TSH, CBC.  Continue carvedilol 6.25 mg twice daily.  We will stop her amlodipine.  She will continue to monitor BP at home.  Disposition: Follow up 2-3 mos with Buford Dresser, MD or APP.  Signed, Loel Dubonnet, NP 03/03/2022, 3:37 PM [place]

## 2022-03-03 NOTE — Patient Instructions (Addendum)
Medication Instructions:  Your physician has recommended you make the following change in your medication: STOP Amlodipine   *If you need a refill on your cardiac medications before your next appointment, please call your pharmacy*   Lab Work: Your physician recommends that you return for lab work today: thyroid panel, CBC, BMP  If you have labs (blood work) drawn today and your tests are completely normal, you will receive your results only by: [place] (if you have MyChart) OR A paper copy in the mail If you have any lab test that is abnormal or we need to change your treatment, we will call you to review the results.  Testing/Procedures: We will reschedule your cardiac CT.   Follow-Up: At [facility], you and your health needs are our priority.  As part of our continuing mission to provide you with exceptional heart care, we have created designated Provider Care Teams.  These Care Teams include your primary Cardiologist (physician) and Advanced Practice Providers (APPs -  Physician Assistants and Nurse Practitioners) who all work together to provide you with the care you need, when you need it.  We recommend signing up for the patient portal called "MyChart".  Sign up information is provided on this After Visit Summary.  MyChart is used to connect with patients for Virtual Visits (Telemedicine).  Patients are able to view lab/test results, encounter notes, upcoming appointments, etc.  Non-urgent messages can be sent to your provider as well.   To learn more about what you can do with MyChart, go to [email or web address].    Your next appointment:   2-3 month(s)  The format for your next appointment:   In Person  Provider:   Buford Dresser, MD or Loel Dubonnet, NP     Other Instructions  Try compression socks to prevent lighteadedness: Recommend compression socks with 15-20 mmHg. Try the zipper ones!    Your cardiac CT will be scheduled at one of the  below locations:   [facility] [address] [place], [place] [address] [phone]   If scheduled at [facility], please arrive at the [facility] (Entrance C2) of [facility] 30 minutes prior to test start time. You can use the FREE valet parking offered at entrance C (encouraged to control the heart rate for the test)  Proceed to the [facility] Radiology Department (first floor) to check-in and test prep.  All radiology patients and guests should use entrance C2 at [facility], accessed from [facility], even though the hospital's physical address listed is [address]     Please follow these instructions carefully (unless otherwise directed):  On the Night Before the Test: Be sure to Drink plenty of water. Do not consume any caffeinated/decaffeinated beverages or chocolate 12 hours prior to your test. Do not take any antihistamines 12 hours prior to your test.  On the Day of the Test: Drink plenty of water until 1 hour prior to the test. Do not eat any food 4 hours prior to the test. You may take your regular medications prior to the test.  Take Ivabradine two hours prior to test. FEMALES- please wear underwire-free bra if available, avoid dresses & tight clothing      After the Test: Drink plenty of water. After receiving IV contrast, you may experience a mild flushed feeling. This is normal. On occasion, you may experience a mild rash up to 24 hours after the test. This is  not dangerous. If this occurs, you can take Benadryl 25 mg and increase your fluid intake. If you experience trouble breathing, this can be serious. If it is severe call 911 IMMEDIATELY. If it is mild, please call our office. If you take any of these medications: Glipizide/Metformin, Avandament, Glucavance, please do not take 48 hours after completing test unless otherwise instructed.  We will call to schedule your test  2-4 weeks out understanding that some insurance companies will need an authorization prior to the service being performed.   For non-scheduling related questions, please contact the cardiac imaging nurse navigator should you have any questions/concerns: Marchia Bond, Cardiac Imaging Nurse Navigator Gordy Clement, Cardiac Imaging Nurse Navigator [facility] Direct Office Dial: [phone]   For scheduling needs, including cancellations and rescheduling, please call [place], [phone].   Hypotension As your heart beats, it forces blood through your body. This force is called blood pressure. If you have hypotension, you have low blood pressure.  When your blood pressure is too low, you may not get enough blood to your brain or other parts of your body. This may cause you to feel weak, light-headed, have a fast heartbeat, or even faint. Low blood pressure may be harmless, or it may cause serious problems. What are the causes? Blood loss. Not enough water in the body (dehydration). Heart problems. Hormone problems. Pregnancy. A very bad infection. Not having enough of certain nutrients. Very bad allergic reactions. Certain medicines. What increases the risk? Age. The risk increases as you get older. Conditions that affect the heart or the brain and spinal cord (central nervous system). What are the signs or symptoms? Feeling: Weak. Light-headed. Dizzy. Tired (fatigued). Blurred vision. Fast heartbeat. Fainting, in very bad cases. How is this treated? Changing your diet. This may involve drinking more water or including more salt (sodium) in your diet by eating high-salt foods. Taking medicines to raise your blood pressure. Changing how much you take (the dosage) of some of your medicines. Wearing compression stockings. These stockings help to prevent blood clots and reduce swelling in your legs. In some cases, you may need to go to the hospital  to: Receive fluids through an IV tube. Receive donated blood through an IV tube (transfusion). Get treated for an infection or heart problems, if this applies. Be monitored while medicines that you are taking wear off. Follow these instructions at home: Eating and drinking  Drink enough fluids to keep your pee (urine) pale yellow. Eat a healthy diet. Follow instructions from your doctor about what you can eat or drink. A healthy diet includes: Fresh fruits and vegetables. Whole grains. Low-fat (lean) meats. Low-fat dairy products. If told, include more salt in your diet. Do not add extra salt to your diet unless your doctor tells you to. Eat small meals often. Avoid standing up quickly after you eat. Medicines Take over-the-counter and prescription medicines only as told by your doctor. Follow instructions from your doctor about changing how much you take of your medicines, if this applies. Do not stop or change any of your medicines on your own. General instructions  Wear compression stockings as told by your doctor. Get up slowly from lying down or sitting. Avoid hot showers and a lot of heat as told by your doctor. Return to your normal activities when your doctor says that it is safe. Do not smoke or use any products that contain nicotine or tobacco. If you need help quitting, ask your doctor. Keep  all follow-up visits. Contact a doctor if: You vomit. You have watery poop (diarrhea). You have a fever for more than 2-3 days. You feel more thirsty than normal. You feel weak and tired. Get help right away if: You have chest pain. You have a fast or uneven heartbeat. You lose feeling (have numbness) in any part of your body. You cannot move your arms or your legs. You have trouble talking. You get sweaty or feel light-headed. You faint. You have trouble breathing. You have trouble staying awake. You feel mixed up (confused). These symptoms may be an emergency. Get help  right away. Call 911. Do not wait to see if the symptoms will go away. Do not drive yourself to the hospital. Summary Hypotension is also called low blood pressure. It is when the force of blood pumping through your body is too weak. Hypotension may be harmless, or it may cause serious problems. Treatment may include changing your diet and medicines, and wearing compression stockings. In very bad cases, you may need to go to the hospital. This information is not intended to replace advice given to you by your health care provider. Make sure you discuss any questions you have with your health care provider. Document Revised: 04/13/2021 Document Reviewed: 04/13/2021 Elsevier Patient Education  [place].

## 2022-03-04 ENCOUNTER — Other Ambulatory Visit (HOSPITAL_COMMUNITY): Payer: Self-pay

## 2022-03-04 LAB — CBC
Hematocrit: 44.9 % (ref 34.0–46.6)
Hemoglobin: 15.1 g/dL (ref 11.1–15.9)
MCH: 28.9 pg (ref 26.6–33.0)
MCHC: 33.6 g/dL (ref 31.5–35.7)
MCV: 86 fL (ref 79–97)
Platelets: 434 10*3/uL (ref 150–450)
RBC: 5.23 x10E6/uL (ref 3.77–5.28)
RDW: 13.8 % (ref 11.7–15.4)
WBC: 12.8 10*3/uL — ABNORMAL HIGH (ref 3.4–10.8)

## 2022-03-04 LAB — BASIC METABOLIC PANEL
BUN/Creatinine Ratio: 14 (ref 9–23)
BUN: 20 mg/dL (ref 6–24)
CO2: 24 mmol/L (ref 20–29)
Calcium: 9.6 mg/dL (ref 8.7–10.2)
Chloride: 98 mmol/L (ref 96–106)
Creatinine, Ser: 1.48 mg/dL — ABNORMAL HIGH (ref 0.57–1.00)
Glucose: 87 mg/dL (ref 70–99)
Potassium: 4.8 mmol/L (ref 3.5–5.2)
Sodium: 138 mmol/L (ref 134–144)
eGFR: 41 mL/min/{1.73_m2} — ABNORMAL LOW (ref >59)

## 2022-03-04 LAB — THYROID PANEL WITH TSH
Free Thyroxine Index: 2.5 (ref 1.2–4.9)
T3 Uptake Ratio: 27 % (ref 24–39)
T4, Total: 9.1 ug/dL (ref 4.5–12.0)
TSH: 2.6 u[IU]/mL (ref 0.450–4.500)

## 2022-03-05 ENCOUNTER — Encounter (HOSPITAL_BASED_OUTPATIENT_CLINIC_OR_DEPARTMENT_OTHER): Payer: Self-pay | Admitting: Family

## 2022-03-05 ENCOUNTER — Other Ambulatory Visit (HOSPITAL_COMMUNITY): Payer: Self-pay

## 2022-03-07 IMAGING — DX DG HAND COMPLETE 3+V*L*
3 series · 3 of 3 positions shown · non-contrast
Comparison: 08/25/2016 radiograph

CLINICAL DATA: Injury of left hand. Fall in the beginning [DATE].

EXAM:
LEFT HAND - COMPLETE 3+ VIEW

[hand pa (1 of 2)]
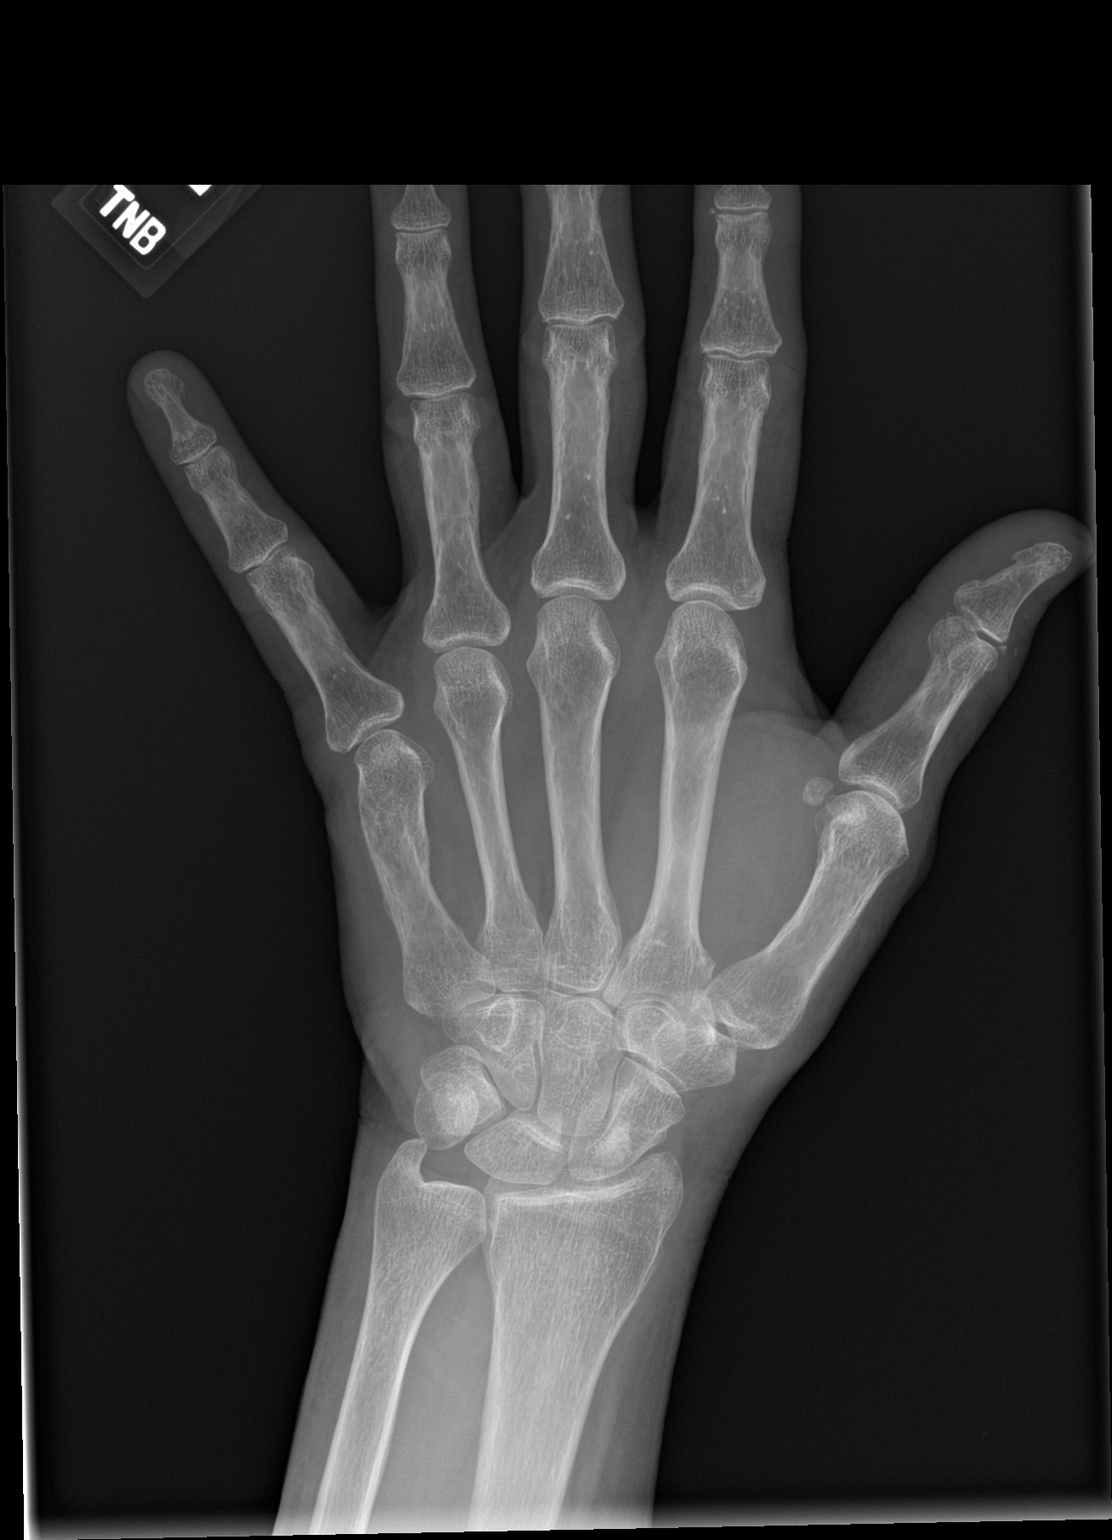

[hand lat]
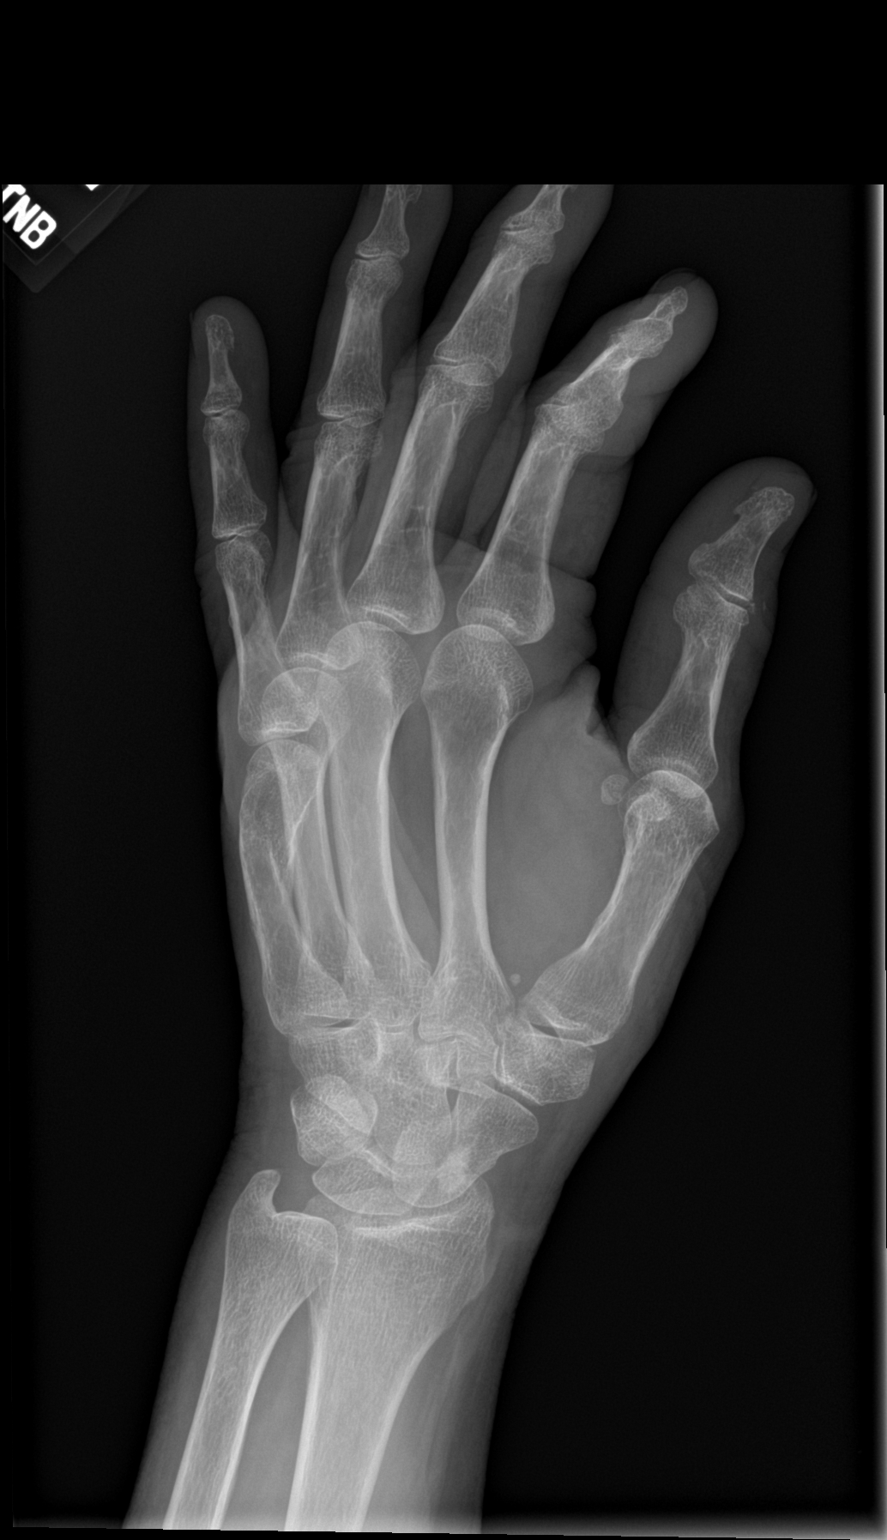

[hand pa (2 of 2)]
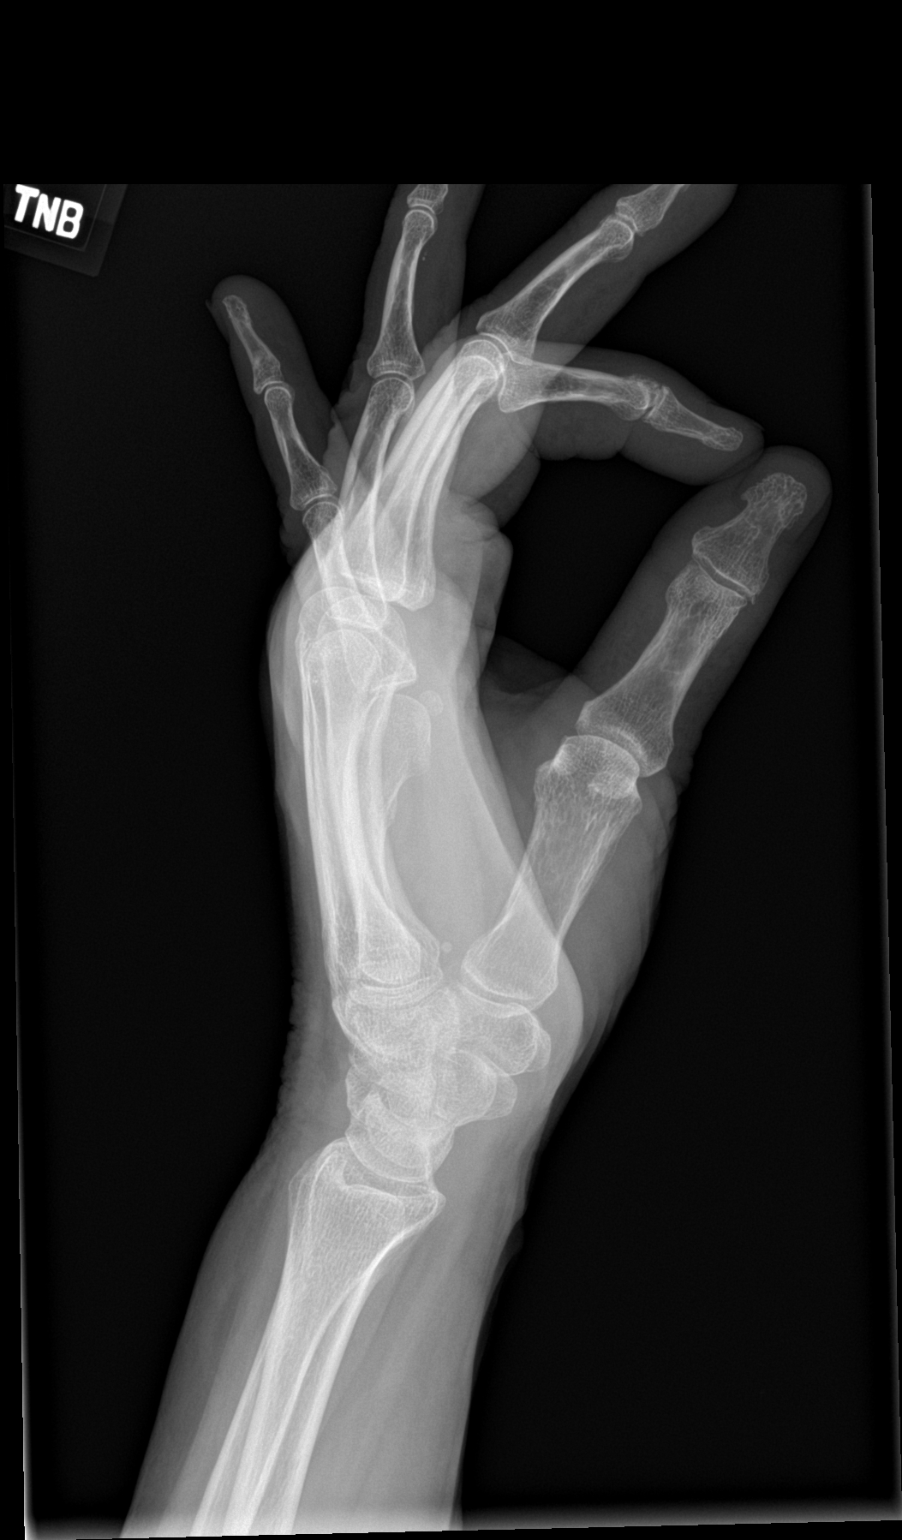

[3 of 3 positions shown; findings below may reference images not displayed]

FINDINGS: No acute fracture or dislocation. Remote fifth metacarpal fracture
with healed fracture deformity, unchanged in appearance from prior
exam. Again seen sclerotic focus in the scaphoid, likely bone
island. No erosion or bony destruction.
IMPRESSION: 1. No acute fracture or dislocation.
2. Remote fifth metacarpal fracture.

## 2022-03-07 IMAGING — DX DG CHEST 2V
2 series · 2 of 2 positions shown · non-contrast
Comparison: Radiograph 03/16/2021, CT 03/20/2021

CLINICAL DATA: Cough.  Patient reports productive cough.

EXAM:
CHEST - 2 VIEW

[chest pa]
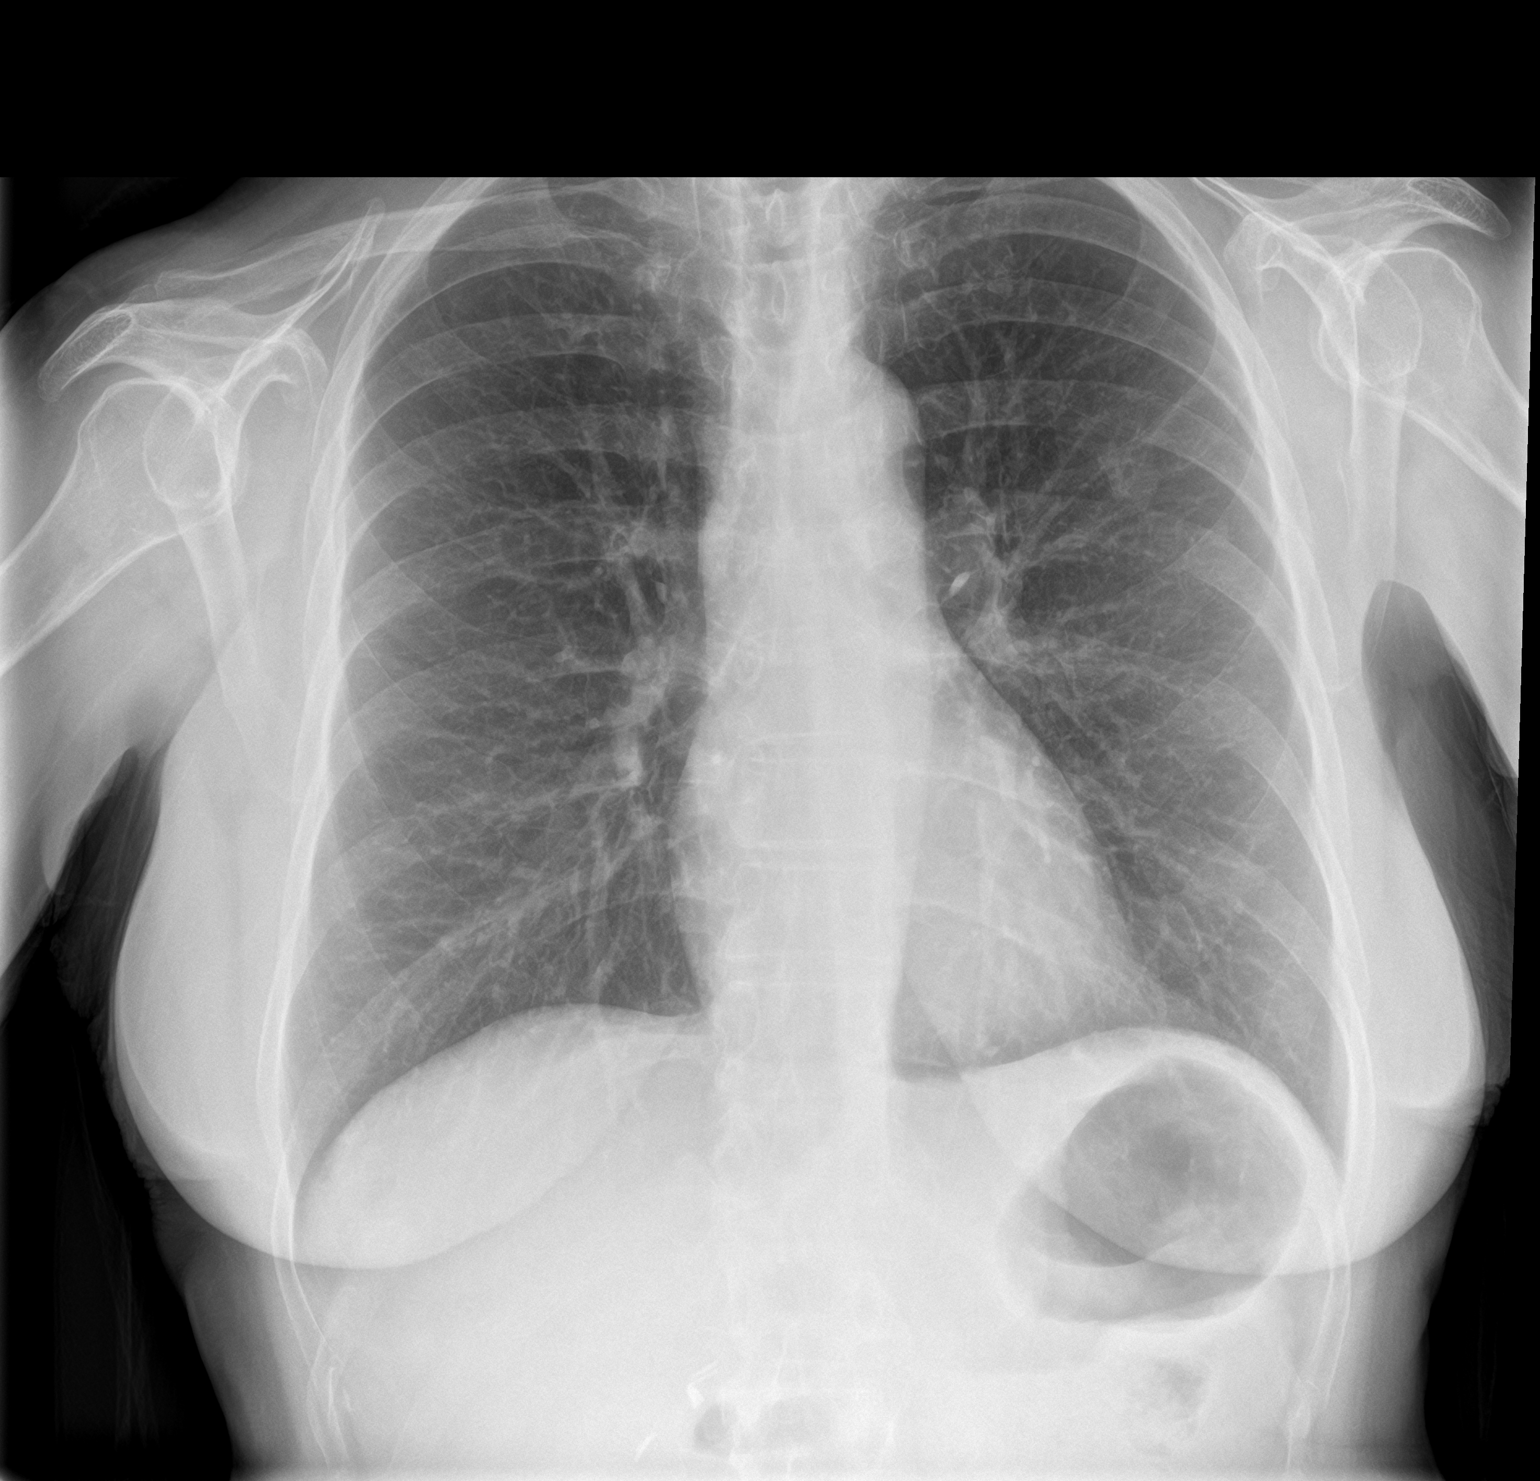

[chest lat]
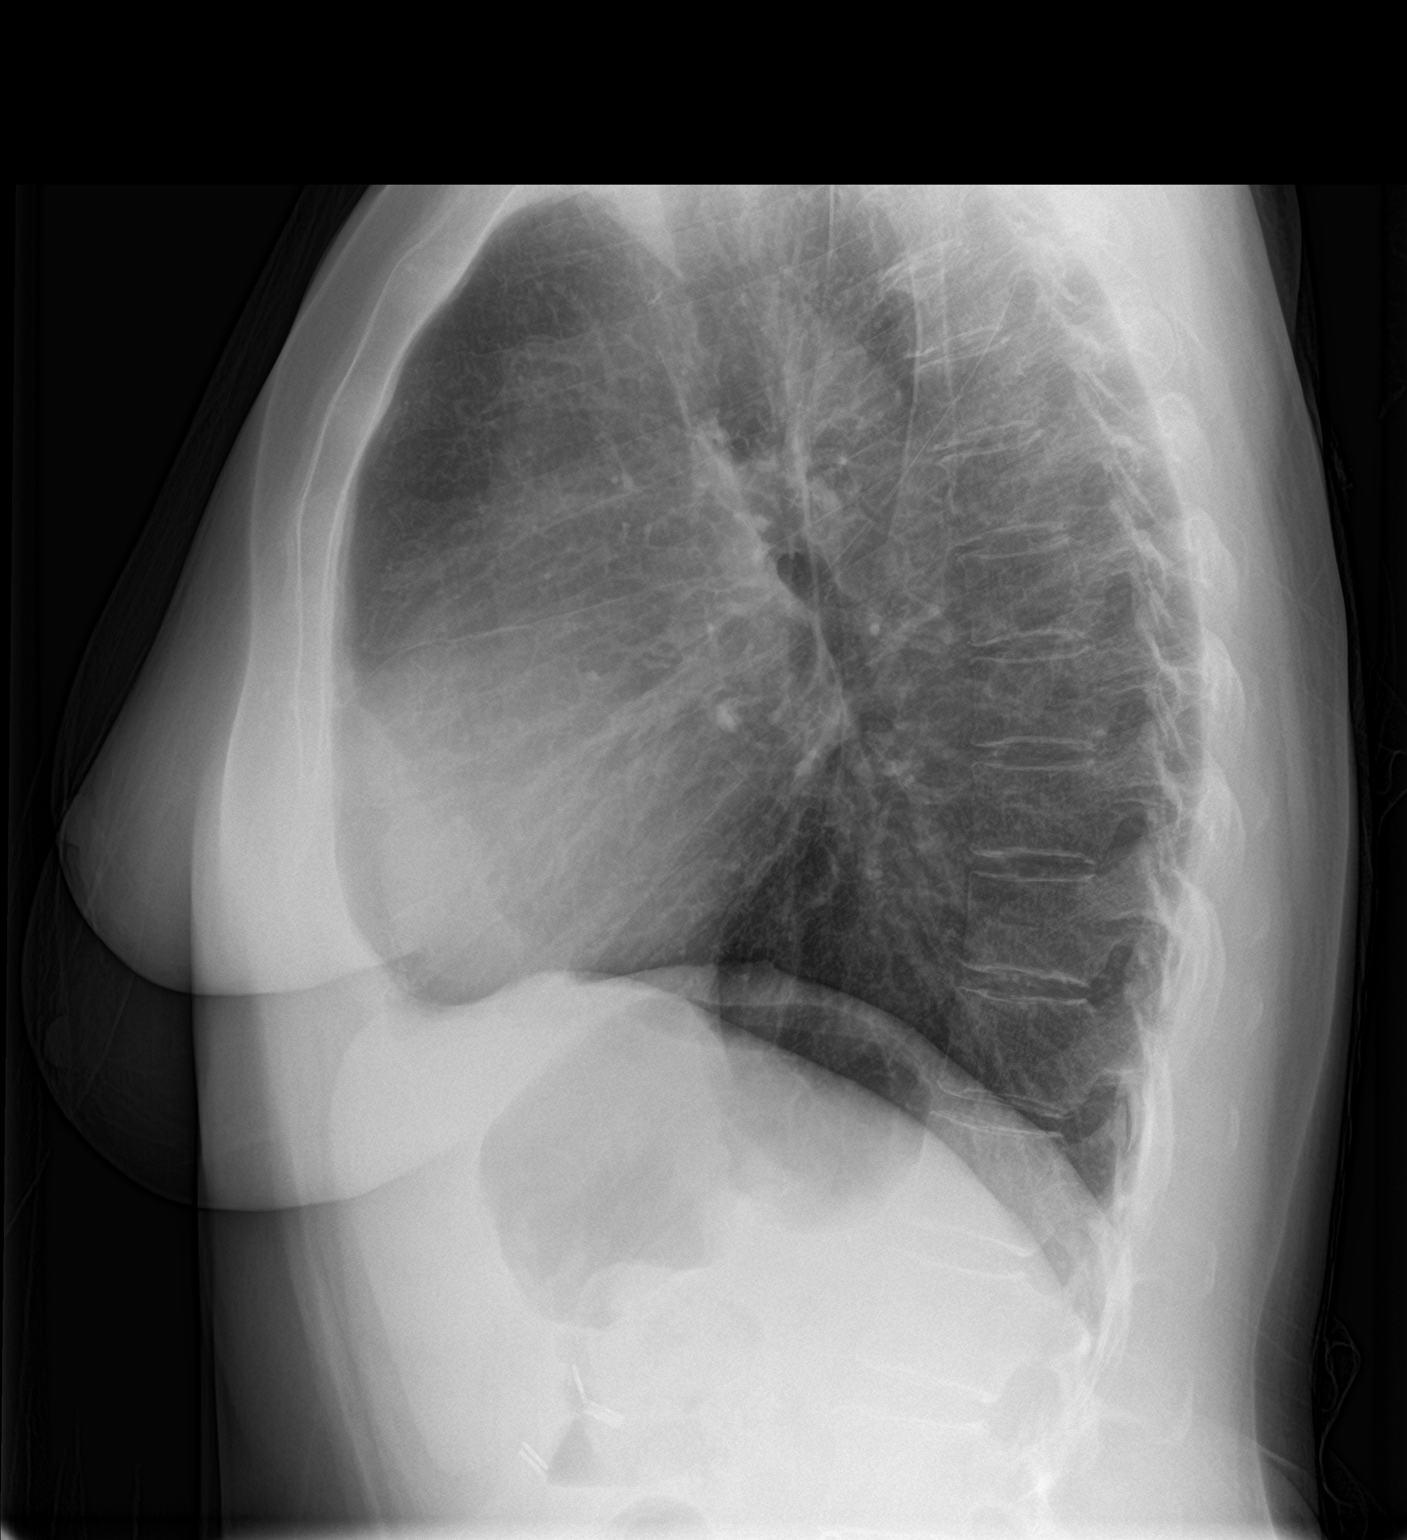

[2 of 2 positions shown; findings below may reference images not displayed]

FINDINGS: The cardiomediastinal contours are normal. No pulmonary mass or
visualized nodule. Pulmonary vasculature is normal. No
consolidation, pleural effusion, or pneumothorax. Left anterior
third rib fracture with callus formation, similar in radiographic
appearance to prior. No acute osseous abnormalities are seen.
IMPRESSION: No acute chest findings.

## 2022-03-07 IMAGING — DX DG KNEE COMPLETE 4+V*R*
4 series · 4 of 4 positions shown · non-contrast
Comparison: Knee radiograph 07/20/2018

CLINICAL DATA: Right knee injury.  Fall in beginning [DATE].

EXAM:
RIGHT KNEE - COMPLETE 4+ VIEW

[knee ap]
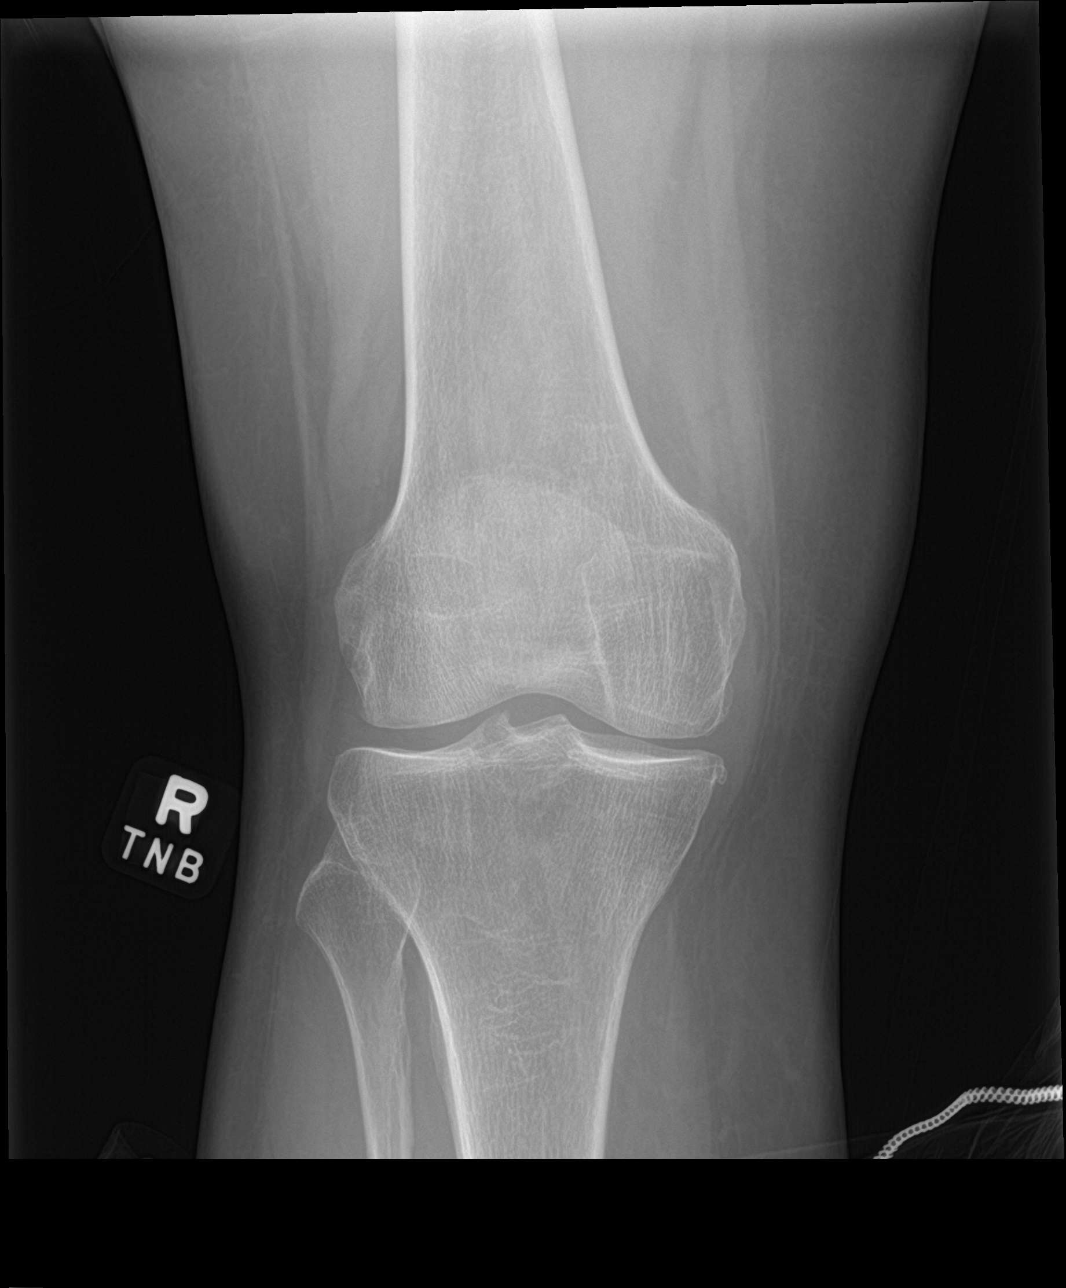

[knee obl (1 of 2)]
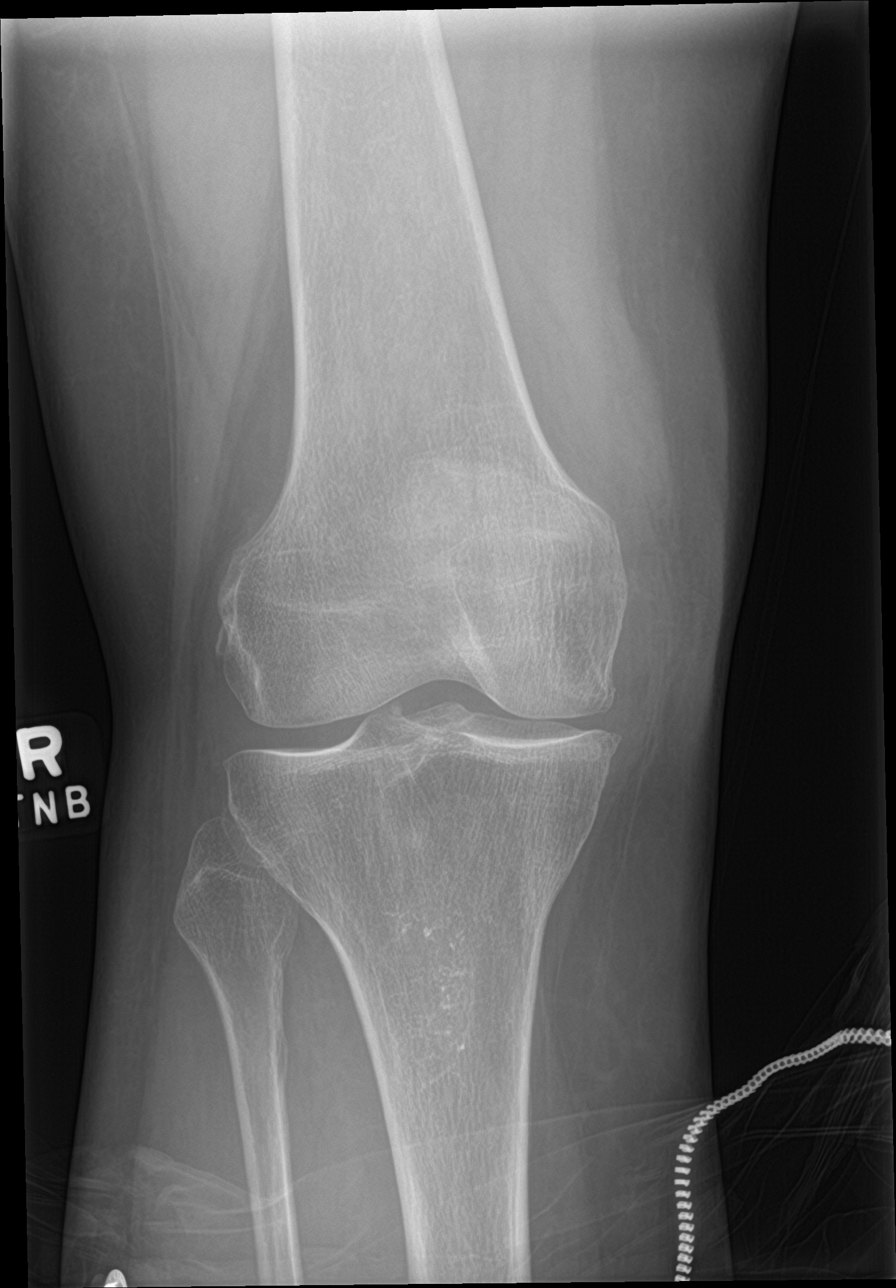

[knee obl (2 of 2)]
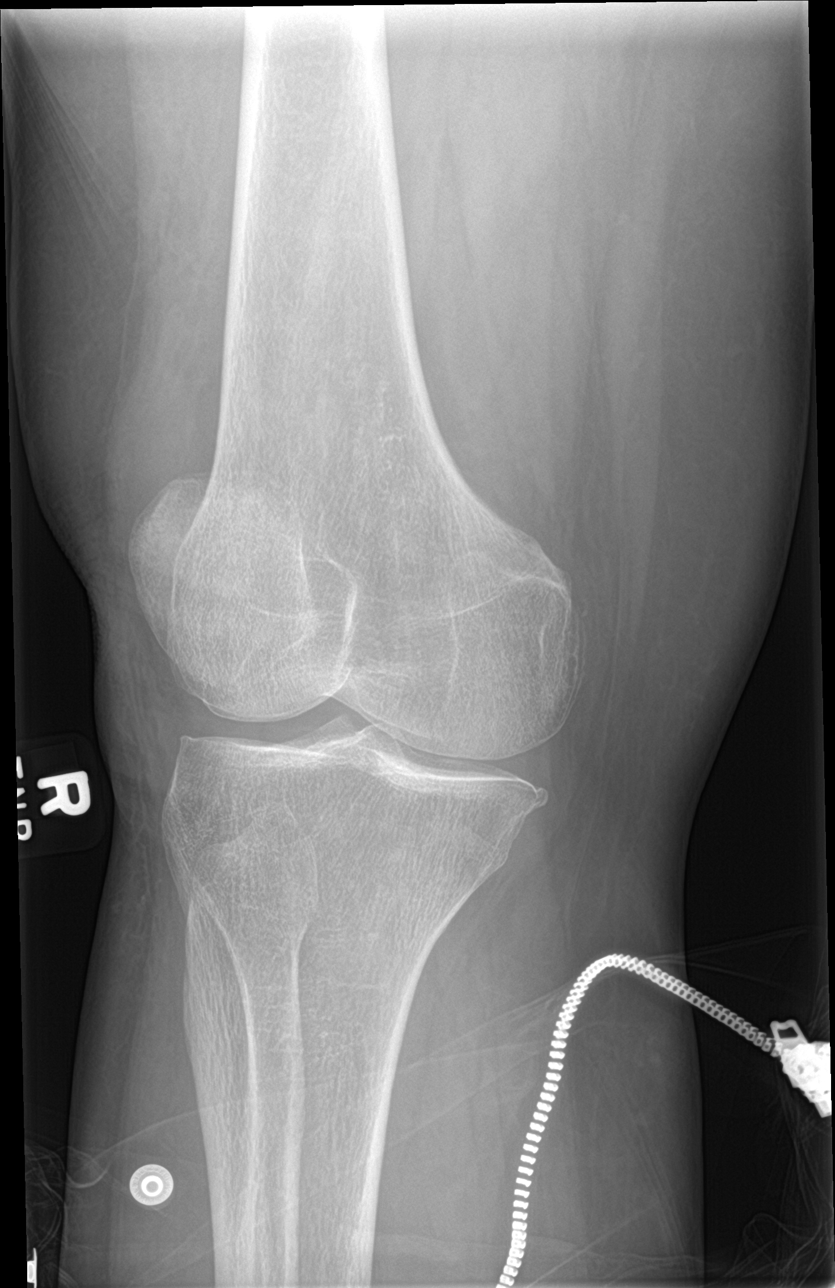

[knee lat]
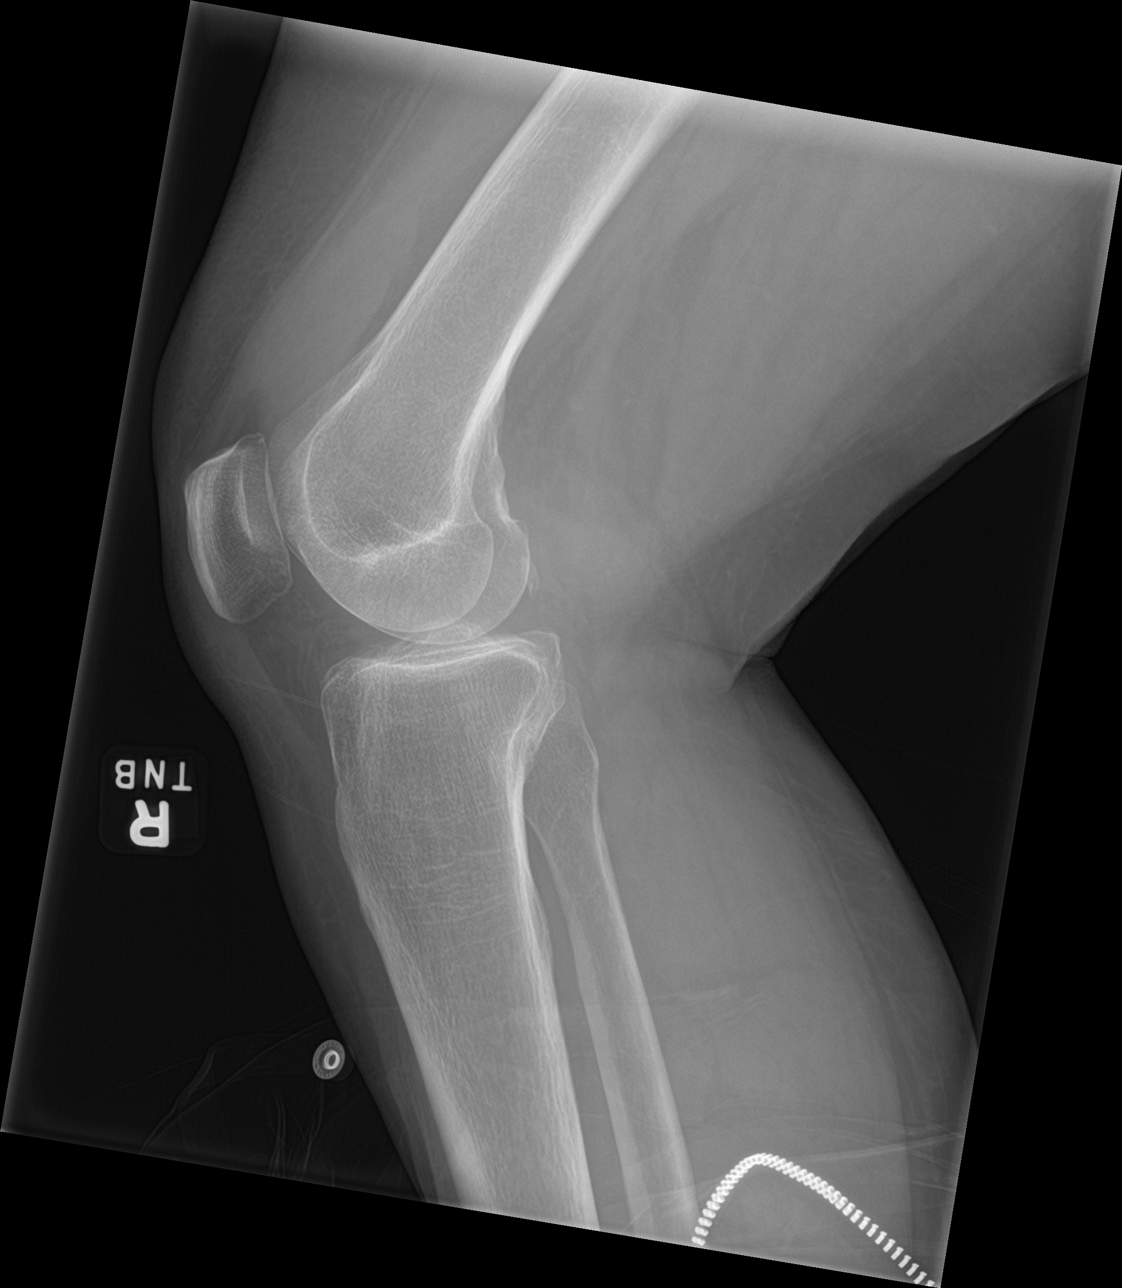

[4 of 4 positions shown; findings below may reference images not displayed]

FINDINGS: No fracture. Normal alignment. No dislocation. There is mild medial
greater than lateral peripheral tibiofemoral spurring. There is a
moderate knee joint effusion. No erosion or bony destruction. No
focal soft tissue abnormalities are seen.
IMPRESSION: 1. No acute fracture or dislocation.
2. Moderate knee joint effusion.
3. Mild osteoarthritis.

## 2022-03-09 DIAGNOSIS — J189 Pneumonia, unspecified organism: Secondary | ICD-10-CM | POA: Diagnosis not present

## 2022-03-09 DIAGNOSIS — H1031 Unspecified acute conjunctivitis, right eye: Secondary | ICD-10-CM | POA: Diagnosis not present

## 2022-03-09 DIAGNOSIS — R0602 Shortness of breath: Secondary | ICD-10-CM | POA: Diagnosis not present

## 2022-03-10 ENCOUNTER — Encounter (HOSPITAL_BASED_OUTPATIENT_CLINIC_OR_DEPARTMENT_OTHER): Payer: Self-pay

## 2022-03-15 ENCOUNTER — Emergency Department (HOSPITAL_COMMUNITY): Payer: Medicare HMO

## 2022-03-15 ENCOUNTER — Emergency Department (HOSPITAL_COMMUNITY)
Admission: EM | Admit: 2022-03-15 | Discharge: 2022-03-15 | Disposition: A | Discharge status: Home / Self Care | Payer: Medicare HMO | Attending: Emergency Medicine | Admitting: Emergency Medicine

## 2022-03-15 ENCOUNTER — Other Ambulatory Visit: Payer: Self-pay

## 2022-03-15 ENCOUNTER — Encounter (HOSPITAL_COMMUNITY): Payer: Self-pay

## 2022-03-15 DIAGNOSIS — R079 Chest pain, unspecified: Secondary | ICD-10-CM | POA: Diagnosis not present

## 2022-03-15 DIAGNOSIS — D72829 Elevated white blood cell count, unspecified: Secondary | ICD-10-CM | POA: Insufficient documentation

## 2022-03-15 DIAGNOSIS — I509 Heart failure, unspecified: Secondary | ICD-10-CM | POA: Insufficient documentation

## 2022-03-15 DIAGNOSIS — E039 Hypothyroidism, unspecified: Secondary | ICD-10-CM | POA: Insufficient documentation

## 2022-03-15 DIAGNOSIS — B370 Candidal stomatitis: Secondary | ICD-10-CM

## 2022-03-15 DIAGNOSIS — I2510 Atherosclerotic heart disease of native coronary artery without angina pectoris: Secondary | ICD-10-CM | POA: Diagnosis not present

## 2022-03-15 DIAGNOSIS — Z79899 Other long term (current) drug therapy: Secondary | ICD-10-CM | POA: Diagnosis not present

## 2022-03-15 DIAGNOSIS — I110 Hypertensive heart disease with heart failure: Secondary | ICD-10-CM | POA: Diagnosis not present

## 2022-03-15 DIAGNOSIS — R0600 Dyspnea, unspecified: Secondary | ICD-10-CM | POA: Diagnosis not present

## 2022-03-15 DIAGNOSIS — S299XXA Unspecified injury of thorax, initial encounter: Secondary | ICD-10-CM | POA: Diagnosis not present

## 2022-03-15 DIAGNOSIS — S2242XA Multiple fractures of ribs, left side, initial encounter for closed fracture: Secondary | ICD-10-CM | POA: Diagnosis not present

## 2022-03-15 DIAGNOSIS — R070 Pain in throat: Secondary | ICD-10-CM | POA: Diagnosis not present

## 2022-03-15 DIAGNOSIS — R0902 Hypoxemia: Secondary | ICD-10-CM | POA: Diagnosis not present

## 2022-03-15 DIAGNOSIS — J189 Pneumonia, unspecified organism: Secondary | ICD-10-CM | POA: Diagnosis not present

## 2022-03-15 DIAGNOSIS — X58XXXA Exposure to other specified factors, initial encounter: Secondary | ICD-10-CM | POA: Diagnosis not present

## 2022-03-15 DIAGNOSIS — J9811 Atelectasis: Secondary | ICD-10-CM | POA: Diagnosis not present

## 2022-03-15 DIAGNOSIS — R059 Cough, unspecified: Secondary | ICD-10-CM | POA: Diagnosis not present

## 2022-03-15 DIAGNOSIS — Z7982 Long term (current) use of aspirin: Secondary | ICD-10-CM | POA: Insufficient documentation

## 2022-03-15 DIAGNOSIS — R42 Dizziness and giddiness: Secondary | ICD-10-CM | POA: Diagnosis not present

## 2022-03-15 DIAGNOSIS — Z743 Need for continuous supervision: Secondary | ICD-10-CM | POA: Diagnosis not present

## 2022-03-15 LAB — CBC
HCT: 43.8 % (ref 36.0–46.0)
Hemoglobin: 14.1 g/dL (ref 12.0–15.0)
MCH: 28.9 pg (ref 26.0–34.0)
MCHC: 32.2 g/dL (ref 30.0–36.0)
MCV: 89.8 fL (ref 80.0–100.0)
Platelets: 348 10*3/uL (ref 150–400)
RBC: 4.88 MIL/uL (ref 3.87–5.11)
RDW: 15.1 % (ref 11.5–15.5)
WBC: 11.3 10*3/uL — ABNORMAL HIGH (ref 4.0–10.5)
nRBC: 0 % (ref 0.0–0.2)

## 2022-03-15 LAB — TROPONIN I (HIGH SENSITIVITY)
Troponin I (High Sensitivity): 3 ng/L (ref <18)
Troponin I (High Sensitivity): 5 ng/L (ref <18)

## 2022-03-15 LAB — BASIC METABOLIC PANEL
Anion gap: 4 — ABNORMAL LOW (ref 5–15)
BUN: 20 mg/dL (ref 6–20)
CO2: 26 mmol/L (ref 22–32)
Calcium: 8.2 mg/dL — ABNORMAL LOW (ref 8.9–10.3)
Chloride: 109 mmol/L (ref 98–111)
Creatinine, Ser: 1.15 mg/dL — ABNORMAL HIGH (ref 0.44–1.00)
GFR, Estimated: 55 mL/min — ABNORMAL LOW (ref >60)
Glucose, Bld: 124 mg/dL — ABNORMAL HIGH (ref 70–99)
Potassium: 4.4 mmol/L (ref 3.5–5.1)
Sodium: 139 mmol/L (ref 135–145)

## 2022-03-15 MED ORDER — MORPHINE SULFATE (PF) 4 MG/ML IV SOLN
4.0000 mg | Freq: Once | INTRAVENOUS | Status: AC
  Administered 2022-03-15: 4 mg via INTRAVENOUS
  Filled 2022-03-15: qty 1

## 2022-03-15 MED ORDER — HYDROMORPHONE HCL 1 MG/ML IJ SOLN
1.0000 mg | Freq: Once | INTRAMUSCULAR | Status: AC
  Administered 2022-03-15: 1 mg via INTRAVENOUS
  Filled 2022-03-15: qty 1

## 2022-03-15 MED ORDER — IOHEXOL 350 MG/ML SOLN
100.0000 mL | Freq: Once | INTRAVENOUS | Status: AC | PRN
  Administered 2022-03-15: 75 mL via INTRAVENOUS

## 2022-03-15 MED ORDER — SODIUM CHLORIDE 0.9 % IV BOLUS
1000.0000 mL | Freq: Once | INTRAVENOUS | Status: AC
  Administered 2022-03-15: 1000 mL via INTRAVENOUS

## 2022-03-15 MED ORDER — CLOTRIMAZOLE 10 MG MT TROC: 10.0000 mg | Freq: Every day | OROMUCOSAL | 0 refills | Status: AC

## 2022-03-15 NOTE — ED Notes (Signed)
Pt provided discharge instructions and prescription information. Pt was given the opportunity to ask questions and questions were answered.   

## 2022-03-15 NOTE — ED Triage Notes (Signed)
Patient states that she has been sick for several weeks. Went to UC on 7/4 and was diagnosed with PNA. Put on PO ABT, states that she does not feel any better. Denies fever.Reports productive cough with chest pain and SHOB.

## 2022-03-15 NOTE — ED Notes (Signed)
Pt notified RN that she had brief sudden epigastric pressure that radiated up into her chest that has since resolved. EDP notified.

## 2022-03-15 NOTE — ED Provider Notes (Signed)
[facility] Provider Note   CSN: [id number] Arrival date & time: 03/15/22  1032     History {Add pertinent medical, surgical, social history, OB history to HPI:1} Chief Complaint  Patient presents with   Chest Pain    [name] is a 56 y.o. female.  Pt is a 56 yo female with a pmhx significant for cm, htn, hld, hypothyroidism, adrenal insufficiency, cad, chronic pain (on oxy 20), cad, mvp, chf and GERD.  Pt said she has been having cp, sob, and cough for a few weeks.  She saw her cardiologist on 6/28.  Her cardiologist has ordered a cardiac CT which is still pending.  Pt went to UC on 7/4 because she was still having sx.  She was told she had pna and was put on levaquin.       Home Medications Prior to Admission medications   Medication Sig Start Date End Date Taking? Authorizing Provider  albuterol (VENTOLIN HFA) 108 (90 Base) MCG/ACT inhaler Inhale 1-2 puffs into the lungs every 6 (six) hours as needed for wheezing or shortness of breath.  10/22/19  Yes [provider]  alendronate (FOSAMAX) 70 MG tablet Take 70 mg by mouth every Monday. 09/15/20  Yes [provider]  ALPRAZolam Duanne Moron) 0.5 MG tablet Take 0.5 mg by mouth 3 (three) times daily as needed for anxiety.   Yes [provider]  aspirin EC 81 MG tablet Take 81 mg by mouth daily.   Yes [provider]  atorvastatin (LIPITOR) 40 MG tablet Take 1 tablet (40 mg total) by mouth daily. 04/10/21 04/05/22 Yes Buford Dresser, MD  buPROPion (ZYBAN) 150 MG 12 hr tablet Take 150 mg by mouth 2 (two) times daily.   Yes [provider]  carvedilol (COREG) 6.25 MG tablet Take 1 tablet (6.25 mg total) by mouth 2 (two) times daily with a meal. 04/10/21 04/05/22 Yes Buford Dresser, MD  Cholecalciferol (VITAMIN D-1000 MAX ST) 25 MCG (1000 UT) tablet Take 1 tablet by mouth daily.   Yes [provider]  [employer] OLOPATADINE HCL 0.2 % SOLN Apply 1 drop to eye  daily. 03/09/22  Yes [provider]  cyclobenzaprine (FLEXERIL) 10 MG tablet Take 10 mg by mouth 3 (three) times daily as needed for muscle spasms. 03/31/21  Yes [provider]  gabapentin (NEURONTIN) 300 MG capsule Take 300 mg by mouth at bedtime as needed (pain). 01/17/22  Yes [provider]  gabapentin (NEURONTIN) 600 MG tablet Take 600 mg by mouth in the morning, at noon, in the evening, and at bedtime. 07/25/19  Yes [provider]  hydrocortisone (CORTEF) 10 MG tablet Take 10-20 mg by mouth See admin instructions. Takes 20 mg by mouth in the morning and 10 mg at night 04/07/16  Yes [provider]  levofloxacin (LEVAQUIN) 250 MG tablet Take 250 mg by mouth daily. 03/09/22  Yes [provider]  levothyroxine (SYNTHROID) 75 MCG tablet Take 75 mcg by mouth daily before breakfast.   Yes [provider]  meclizine (ANTIVERT) 25 MG tablet 1 or 2 tabs PO q8h prn dizziness Patient taking differently: Take 25 mg by mouth 3 (three) times daily as needed for dizziness. 04/16/16  Yes Francine Graven, DO  ondansetron (ZOFRAN) 4 MG tablet Take 4 mg by mouth every 6 (six) hours as needed for nausea or vomiting. 02/07/21  Yes [provider]  oxyCODONE 20 MG TABS Take 1 tablet (20 mg total) by mouth every 6 (six)  hours as needed for severe pain ((score 7 to 10)). 03/20/21  Yes Meyran, Ocie Cornfield, NP  promethazine-dextromethorphan (PROMETHAZINE-DM) 6.25-15 MG/5ML syrup Take 5 mLs by mouth 3 (three) times daily as needed for cough. 03/09/22  Yes [provider]  topiramate (TOPAMAX) 25 MG tablet Take 1 tablet (25 mg total) by mouth 2 (two) times daily. 01/31/21 03/15/22 Yes Shahmehdi, Seyed A, MD  zolpidem (AMBIEN) 10 MG tablet Take 10 mg by mouth at bedtime. 02/07/21  Yes [provider]  EPINEPHrine 0.3 mg/0.3 mL IJ SOAJ injection Inject 0.3 mg into the muscle as needed for anaphylaxis.    [provider]  ivabradine  (CORLANOR) 5 MG TABS tablet Take '10mg'$  (2 tablets) two hours prior to cardiac CT 03/03/22   Loel Dubonnet, NP  nitroGLYCERIN (NITROSTAT) 0.4 MG SL tablet Place 1 tablet (0.4 mg total) under the tongue every 5 (five) minutes as needed for chest pain. 01/29/22   Loel Dubonnet, NP      Allergies    Bee venom, Doxycycline, Erythromycin, Ibuprofen, Penicillins, Sulfa antibiotics, Cat hair extract, Dust mite extract, Tramadol, and Sulfamethoxazole-trimethoprim    Review of Systems   Review of Systems  Physical Exam Updated Vital Signs BP (!) 185/95   Pulse 73   Temp 98.8 F (37.1 C) (Oral)   Resp 20   Ht 5' 5.5" (1.664 m)   Wt 77.1 kg   SpO2 98%   BMI 27.86 kg/m  Physical Exam  ED Results / Procedures / Treatments   Labs (all labs ordered are listed, but only abnormal results are displayed) Labs Reviewed  BASIC METABOLIC PANEL - Abnormal; Notable for the following components:      Result Value   Glucose, Bld 124 (*)    Creatinine, Ser 1.15 (*)    Calcium 8.2 (*)    GFR, Estimated 55 (*)    Anion gap 4 (*)    All other components within normal limits  CBC - Abnormal; Notable for the following components:   WBC 11.3 (*)    All other components within normal limits  TROPONIN I (HIGH SENSITIVITY)  TROPONIN I (HIGH SENSITIVITY)    EKG EKG Interpretation  Date/Time:  Monday March 15 2022 10:49:24 EDT Ventricular Rate:  75 PR Interval:  126 QRS Duration: 101 QT Interval:  387 QTC Calculation: 433 R Axis:   74 Text Interpretation: Sinus rhythm Probable left atrial enlargement No significant change since last tracing Confirmed by Isla Pence [phone]) on 03/15/2022 2:45:29 PM  Radiology CT Angio Chest PE W and/or Wo Contrast  Result Date: 03/15/2022 CLINICAL DATA:  Productive cough. Recently diagnosed with pneumonia. Clinical concern for pulmonary embolism. EXAM: CT ANGIOGRAPHY CHEST WITH CONTRAST TECHNIQUE: Multidetector CT imaging of the chest was performed using the  standard protocol during bolus administration of intravenous contrast. Multiplanar CT image reconstructions and MIPs were obtained to evaluate the vascular anatomy. RADIATION DOSE REDUCTION: This exam was performed according to the departmental dose-optimization program which includes automated exposure control, adjustment of the mA and/or kV according to patient size and/or use of iterative reconstruction technique. CONTRAST:  17m OMNIPAQUE IOHEXOL 350 MG/ML SOLN COMPARISON:  08/07/2021. Current chest radiograph. FINDINGS: Cardiovascular: Pulmonary arteries are well opacified. There is no evidence of a pulmonary embolism. Heart normal in size and configuration. Mild left and right coronary artery calcifications. No pericardial effusion. Great vessels are normal in caliber. No aortic dissection or significant atherosclerosis. Mediastinum/Nodes: No enlarged mediastinal, hilar, or axillary lymph nodes. Thyroid gland, trachea,  and esophagus demonstrate no significant findings. Lungs/Pleura: Minimal dependent subsegmental atelectasis. Lungs otherwise clear. No pleural effusion or pneumothorax. Upper Abdomen: No acute findings.  Prior cholecystectomy. Musculoskeletal: There are fractures of the anterior left third, fourth and fifth ribs that demonstrate subtle adjacent callus formation, consistent with subacute fractures, new since the prior chest CT. No other fractures.  No bone lesions. Review of the MIP images confirms the above findings. IMPRESSION: 1. No evidence of a pulmonary embolism. 2. Fractures of the left anterior third, fourth and fifth ribs that appear subacute, warranting clinical correlation. 3. No other evidence of an acute or recent abnormality. 4. Two vessel coronary artery calcifications. Electronically Signed   By: Lajean Manes M.D.   On: 03/15/2022 15:20   DG Chest 2 View  Result Date: 03/15/2022 CLINICAL DATA:  Dyspnea, chest pain, pneumonia EXAM: CHEST - 2 VIEW COMPARISON:  09/14/2021 chest  radiograph. FINDINGS: Partially visualized surgical hardware from ACDF. Surgical clips in the right upper quadrant of the abdomen. Stable cardiomediastinal silhouette with normal heart size. No pneumothorax. No pleural effusion. Lungs appear clear, with no acute consolidative airspace disease and no pulmonary edema. IMPRESSION: No active cardiopulmonary disease. Electronically Signed   By: Ilona Sorrel M.D.   On: 03/15/2022 11:14    Procedures Procedures  {Document cardiac monitor, telemetry assessment procedure when appropriate:1}  Medications Ordered in ED Medications  sodium chloride 0.9 % bolus 1,000 mL (1,000 mLs Intravenous New Bag/Given 03/15/22 1156)  morphine (PF) 4 MG/ML injection 4 mg (4 mg Intravenous Given 03/15/22 1157)  iohexol (OMNIPAQUE) 350 MG/ML injection 100 mL (75 mLs Intravenous Contrast Given 03/15/22 1505)  HYDROmorphone (DILAUDID) injection 1 mg (1 mg Intravenous Given 03/15/22 1530)    ED Course/ Medical Decision Making/ A&P                           Medical Decision Making Amount and/or Complexity of Data Reviewed Labs: ordered. Radiology: ordered.  Risk Prescription drug management.   ***  {Document critical care time when appropriate:1} {Document review of labs and clinical decision tools ie heart score, Chads2Vasc2 etc:1}  {Document your independent review of radiology images, and any outside records:1} {Document your discussion with family members, caretakers, and with consultants:1} {Document social determinants of health affecting pt's care:1} {Document your decision making why or why not admission, treatments were needed:1} Final Clinical Impression(s) / ED Diagnoses Final diagnoses:  None    Rx / DC Orders ED Discharge Orders     None

## 2022-03-19 DIAGNOSIS — M1991 Primary osteoarthritis, unspecified site: Secondary | ICD-10-CM | POA: Diagnosis not present

## 2022-03-19 DIAGNOSIS — I10 Essential (primary) hypertension: Secondary | ICD-10-CM | POA: Diagnosis not present

## 2022-03-19 DIAGNOSIS — M48061 Spinal stenosis, lumbar region without neurogenic claudication: Secondary | ICD-10-CM | POA: Diagnosis not present

## 2022-03-19 DIAGNOSIS — S2249XA Multiple fractures of ribs, unspecified side, initial encounter for closed fracture: Secondary | ICD-10-CM | POA: Diagnosis not present

## 2022-03-24 ENCOUNTER — Telehealth (HOSPITAL_COMMUNITY): Payer: Self-pay | Admitting: *Deleted

## 2022-03-24 NOTE — Telephone Encounter (Signed)
Reaching out to patient to offer assistance regarding upcoming cardiac imaging study; pt verbalizes understanding of appt date/time, parking situation and where to check in, pre-test NPO status and medications ordered, and verified current allergies; name and call back number provided for further questions should they arise  Gordy Clement RN Navigator [facility] [phone] office [phone] cell  Patient will hold AM carvedilol if BP is low. She will take '10mg'$  ivabradine TWO hours prior to her cardiac CT scan and is aware to arrive at 12:30pm.

## 2022-03-25 ENCOUNTER — Ambulatory Visit (HOSPITAL_COMMUNITY)
Admission: RE | Admit: 2022-03-25 | Discharge: 2022-03-25 | Disposition: A | Discharge status: Home / Self Care | Payer: Medicare HMO | Source: Ambulatory Visit | Attending: Family | Admitting: Family

## 2022-03-25 DIAGNOSIS — R072 Precordial pain: Secondary | ICD-10-CM | POA: Diagnosis not present

## 2022-03-25 MED ORDER — NITROGLYCERIN 0.4 MG SL SUBL
0.8000 mg | SUBLINGUAL_TABLET | Freq: Once | SUBLINGUAL | Status: AC
  Administered 2022-03-25: 0.8 mg via SUBLINGUAL

## 2022-03-25 MED ORDER — NITROGLYCERIN 0.4 MG SL SUBL
SUBLINGUAL_TABLET | SUBLINGUAL | Status: AC
  Filled 2022-03-25: qty 2

## 2022-03-25 MED ORDER — IOHEXOL 350 MG/ML SOLN
100.0000 mL | Freq: Once | INTRAVENOUS | Status: AC | PRN
  Administered 2022-03-25: 100 mL via INTRAVENOUS

## 2022-03-25 MED ORDER — SODIUM CHLORIDE 0.9 % IV BOLUS
250.0000 mL | Freq: Once | INTRAVENOUS | Status: AC
  Administered 2022-03-25: 250 mL via INTRAVENOUS

## 2022-03-26 ENCOUNTER — Telehealth (HOSPITAL_BASED_OUTPATIENT_CLINIC_OR_DEPARTMENT_OTHER): Payer: Self-pay

## 2022-03-26 NOTE — Telephone Encounter (Addendum)
Left message for patient to call back       ----- Message from Loel Dubonnet, NP sent at 03/26/2022  8:09 AM EDT ----- Cardiac CTA coronary calcium score 337 plaque.  This placing her at the 90th percentile for age.  Overall there is minimal atherosclerosis with less than 25% stenosis.  Not significant enough to cause chest discomfort.  Continue aspirin, atorvastatin for secondary prevention.

## 2022-03-31 DIAGNOSIS — S225XXS Flail chest, sequela: Secondary | ICD-10-CM | POA: Diagnosis not present

## 2022-03-31 DIAGNOSIS — M549 Dorsalgia, unspecified: Secondary | ICD-10-CM | POA: Diagnosis not present

## 2022-03-31 DIAGNOSIS — G8929 Other chronic pain: Secondary | ICD-10-CM | POA: Diagnosis not present

## 2022-04-06 DIAGNOSIS — M48061 Spinal stenosis, lumbar region without neurogenic claudication: Secondary | ICD-10-CM | POA: Diagnosis not present

## 2022-04-06 DIAGNOSIS — J340 Abscess, furuncle and carbuncle of nose: Secondary | ICD-10-CM | POA: Diagnosis not present

## 2022-04-06 DIAGNOSIS — I10 Essential (primary) hypertension: Secondary | ICD-10-CM | POA: Diagnosis not present

## 2022-04-13 DIAGNOSIS — Z1331 Encounter for screening for depression: Secondary | ICD-10-CM | POA: Diagnosis not present

## 2022-04-13 DIAGNOSIS — H6012 Cellulitis of left external ear: Secondary | ICD-10-CM | POA: Diagnosis not present

## 2022-04-13 DIAGNOSIS — M5136 Other intervertebral disc degeneration, lumbar region: Secondary | ICD-10-CM | POA: Diagnosis not present

## 2022-04-13 DIAGNOSIS — I10 Essential (primary) hypertension: Secondary | ICD-10-CM | POA: Diagnosis not present

## 2022-04-13 DIAGNOSIS — M48061 Spinal stenosis, lumbar region without neurogenic claudication: Secondary | ICD-10-CM | POA: Diagnosis not present

## 2022-04-13 DIAGNOSIS — M4316 Spondylolisthesis, lumbar region: Secondary | ICD-10-CM | POA: Diagnosis not present

## 2022-04-13 DIAGNOSIS — E559 Vitamin D deficiency, unspecified: Secondary | ICD-10-CM | POA: Diagnosis not present

## 2022-04-13 DIAGNOSIS — M1991 Primary osteoarthritis, unspecified site: Secondary | ICD-10-CM | POA: Diagnosis not present

## 2022-04-13 DIAGNOSIS — E663 Overweight: Secondary | ICD-10-CM | POA: Diagnosis not present

## 2022-04-13 DIAGNOSIS — D518 Other vitamin B12 deficiency anemias: Secondary | ICD-10-CM | POA: Diagnosis not present

## 2022-04-13 DIAGNOSIS — Z0001 Encounter for general adult medical examination with abnormal findings: Secondary | ICD-10-CM | POA: Diagnosis not present

## 2022-04-13 DIAGNOSIS — G894 Chronic pain syndrome: Secondary | ICD-10-CM | POA: Diagnosis not present

## 2022-04-13 DIAGNOSIS — Z6828 Body mass index (BMI) 28.0-28.9, adult: Secondary | ICD-10-CM | POA: Diagnosis not present

## 2022-04-13 DIAGNOSIS — E039 Hypothyroidism, unspecified: Secondary | ICD-10-CM | POA: Diagnosis not present

## 2022-04-18 IMAGING — CT CT CHEST LUNG CANCER SCREENING LOW DOSE W/O CM
2 of 3 series · 15 of 36 positions shown, 18 images · non-contrast
Comparison: 03/20/2021 diagnostic CT.

CLINICAL DATA: Thirty pack-year smoking history, quitting 3 months
ago.

EXAM:
CT CHEST WITHOUT CONTRAST LOW-DOSE FOR LUNG CANCER SCREENING
TECHNIQUE: Multidetector CT imaging of the chest was performed following the
standard protocol without IV contrast.

[Series 2: axial st · axial · 0.78mm/px · z∈[+1248,+1548]mm · 12 of 72 slices shown, 15 images]
[im 6/72  mediastinal]
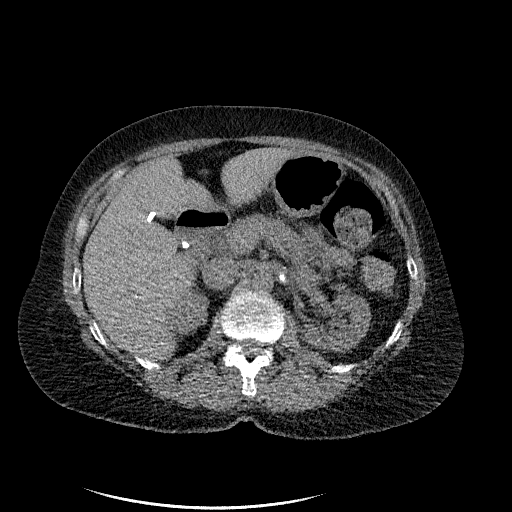
[im 6/72  lung]
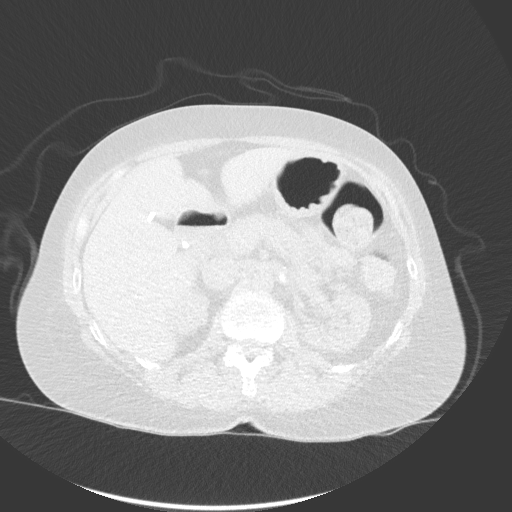
[im 11/72  lung]
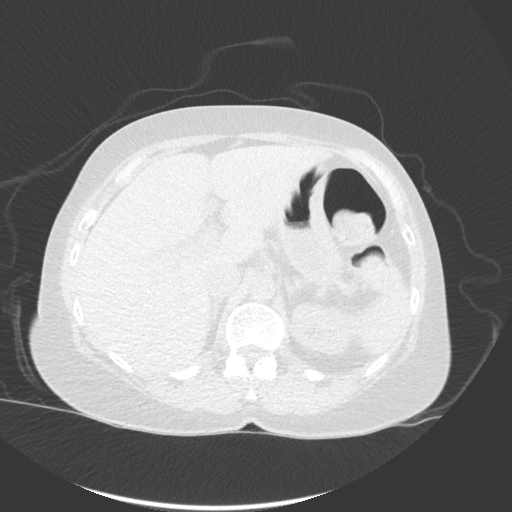
[im 16/72  lung]
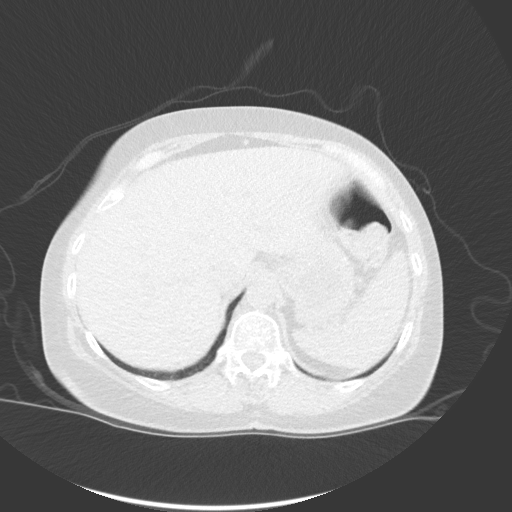
[im 22/72  lung]
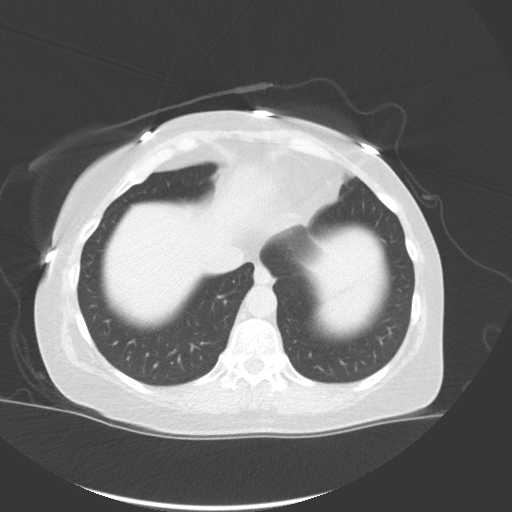
[im 27/72  mediastinal]
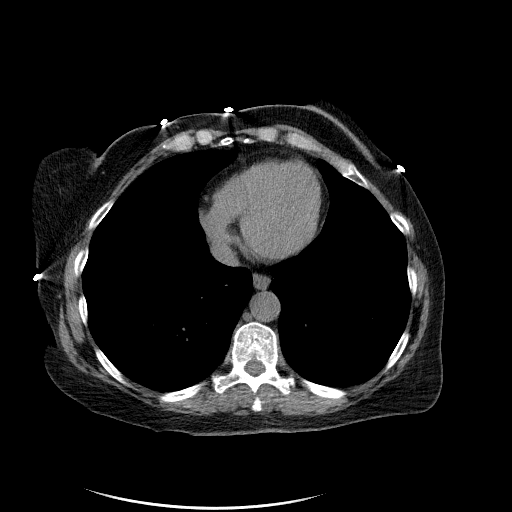
[im 27/72  lung]
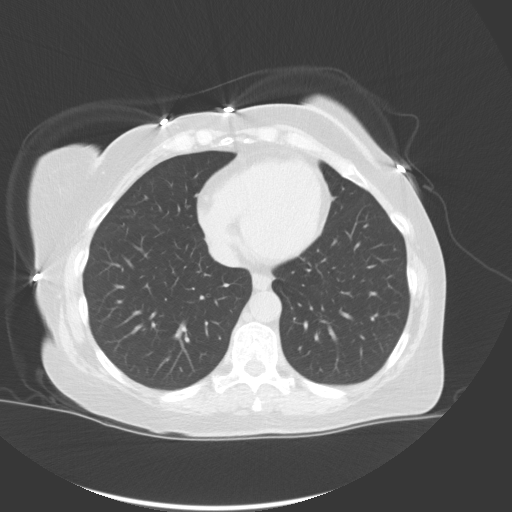
[im 32/72  lung]
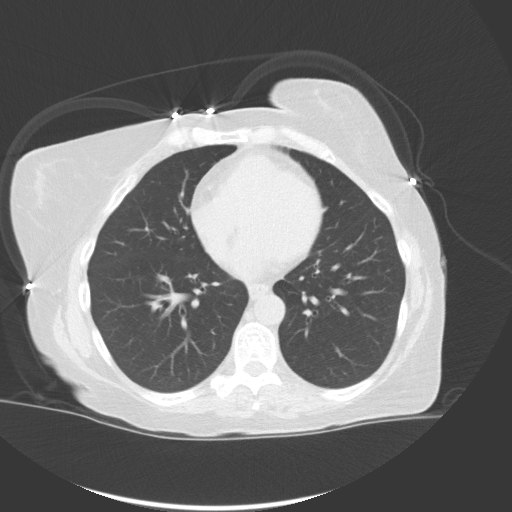
[im 40/72  lung]
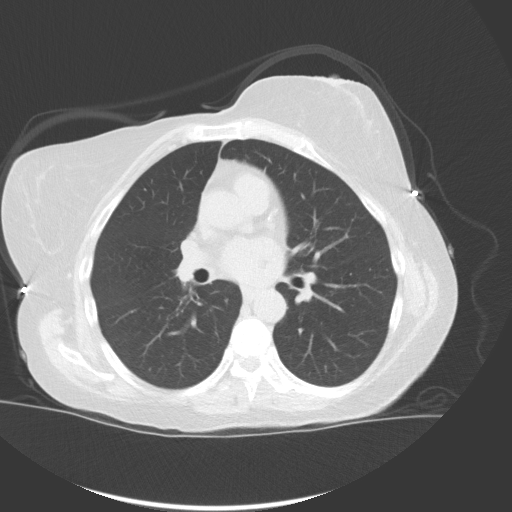
[im 45/72  lung]
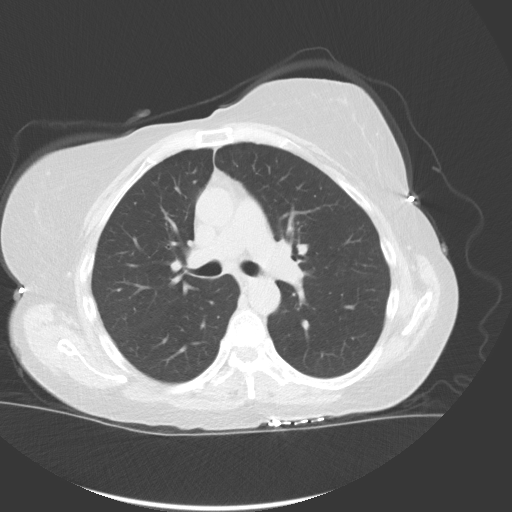
[im 50/72  mediastinal]
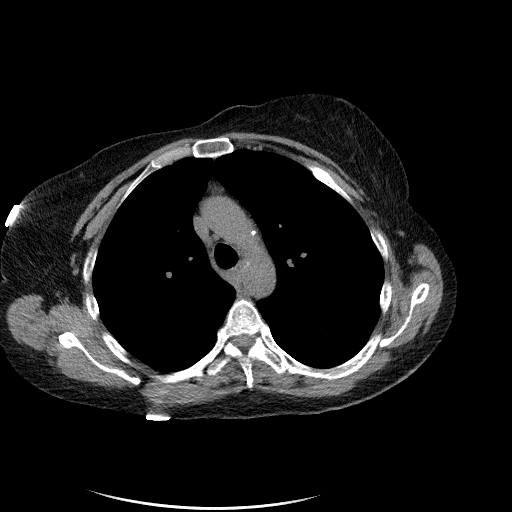
[im 50/72  lung]
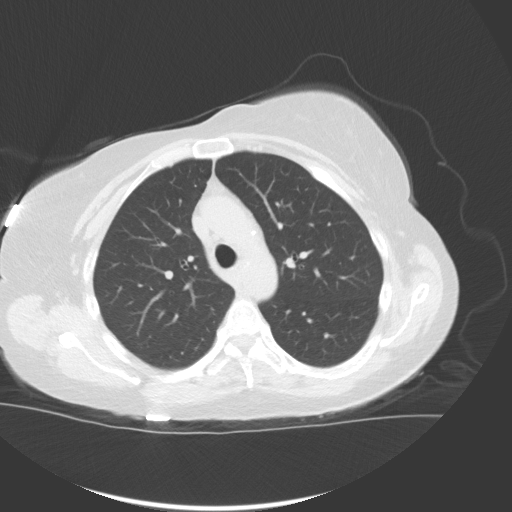
[im 56/72  lung]
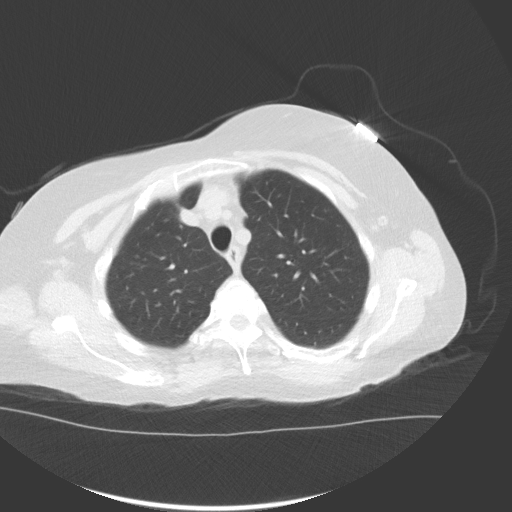
[im 61/72  lung]
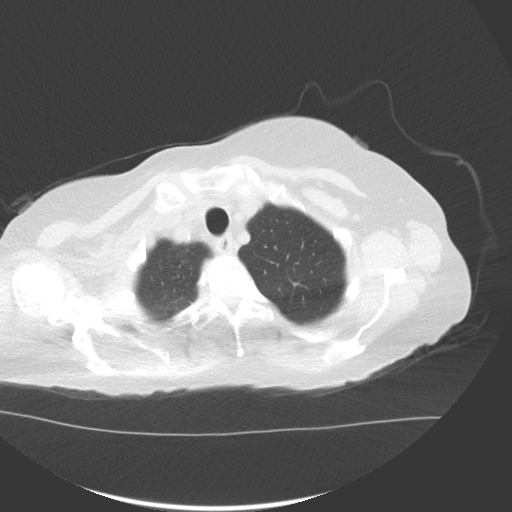
[im 66/72  lung]
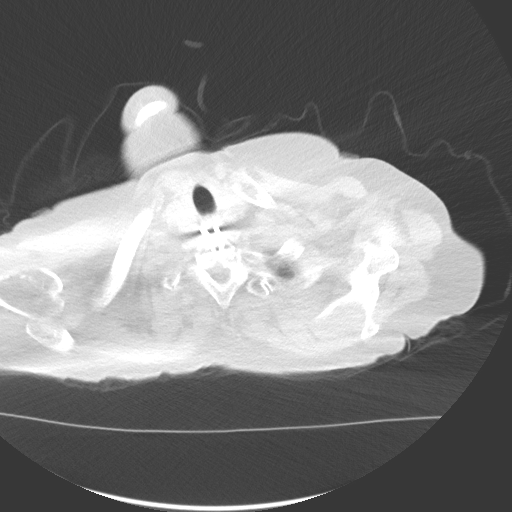

[Series 5: coronal · coronal · 0.70mm/px · 3 of 289 slices shown]
[im 58/289  lung]
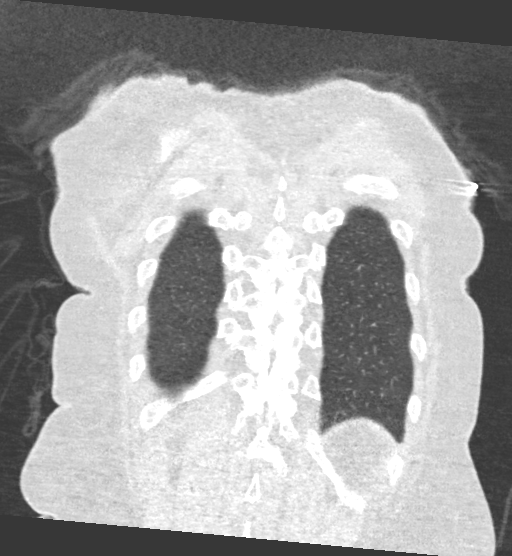
[im 116/289  lung]
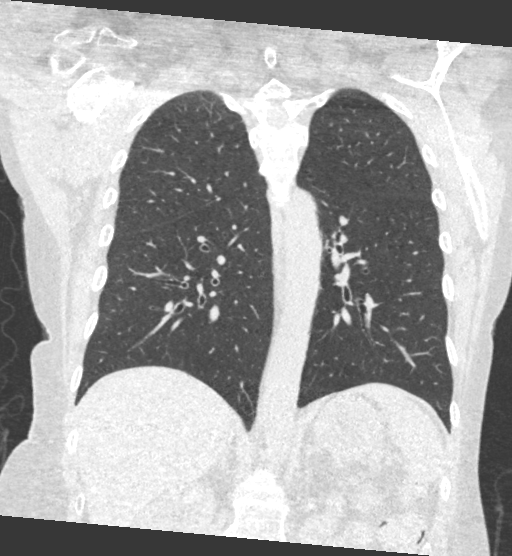
[im 173/289  lung]
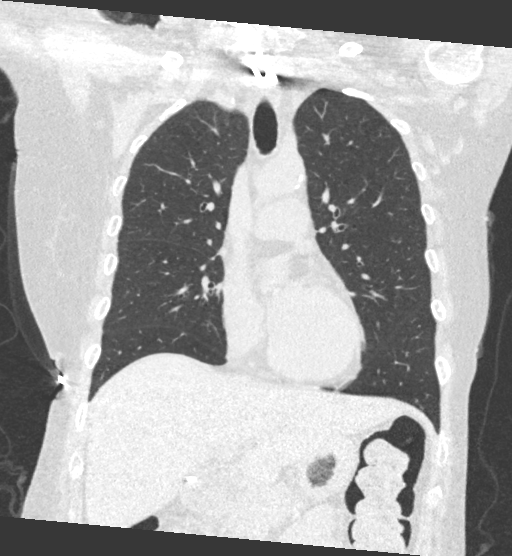

[15 of 36 positions shown; findings below may reference images not displayed]

FINDINGS: Cardiovascular: Aortic atherosclerosis. Normal heart size, without
pericardial effusion. Lad and right coronary artery calcification.

Mediastinum/Nodes: No mediastinal or definite hilar adenopathy,
given limitations of unenhanced CT.

Lungs/Pleura: No pleural fluid. Mild centrilobular emphysema. Clear
lungs.

Upper Abdomen: Cholecystectomy. Normal imaged portions of the liver,
spleen, stomach, pancreas, adrenal glands, kidneys.

Musculoskeletal: Cervical spine fixation. Mild convex right thoracic
spine curvature.
IMPRESSION: 1. Lung-RADS 1, negative. Continue annual screening with low-dose
chest CT without contrast in 12 months.
2. Aortic Atherosclerosis (7WVNU-8MM.M) and Emphysema (7WVNU-IWA.5).
3. Age advanced coronary artery atherosclerosis. Recommend
assessment of coronary risk factors and consideration of medical
therapy.

## 2022-04-20 ENCOUNTER — Other Ambulatory Visit (HOSPITAL_BASED_OUTPATIENT_CLINIC_OR_DEPARTMENT_OTHER): Payer: Self-pay | Admitting: Cardiology

## 2022-04-20 DIAGNOSIS — I639 Cerebral infarction, unspecified: Secondary | ICD-10-CM

## 2022-04-25 ENCOUNTER — Other Ambulatory Visit (HOSPITAL_BASED_OUTPATIENT_CLINIC_OR_DEPARTMENT_OTHER): Payer: Self-pay | Admitting: Family

## 2022-04-25 DIAGNOSIS — I10 Essential (primary) hypertension: Secondary | ICD-10-CM

## 2022-04-25 IMAGING — US US EXTREM LOW VENOUS
1 series · 13 of 24 positions shown · non-contrast
Comparison: None.

CLINICAL DATA: Bilateral lower extremity pain and swelling



[Series 1: us venous img lower bilat (dvt) · portal-venous · 13 of 86 slices shown]
[im 1/86]
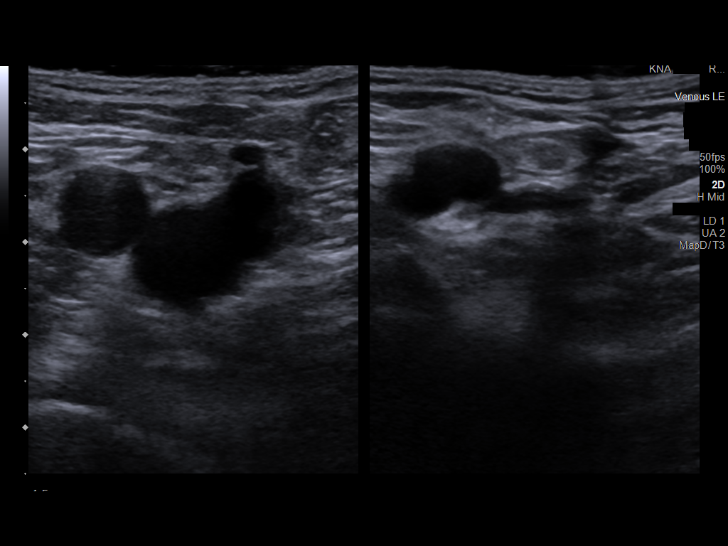
[im 8/86]
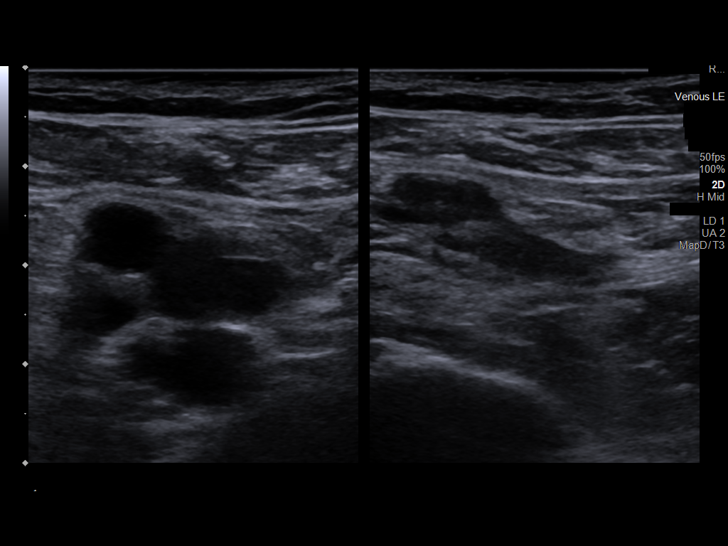
[im 15/86]
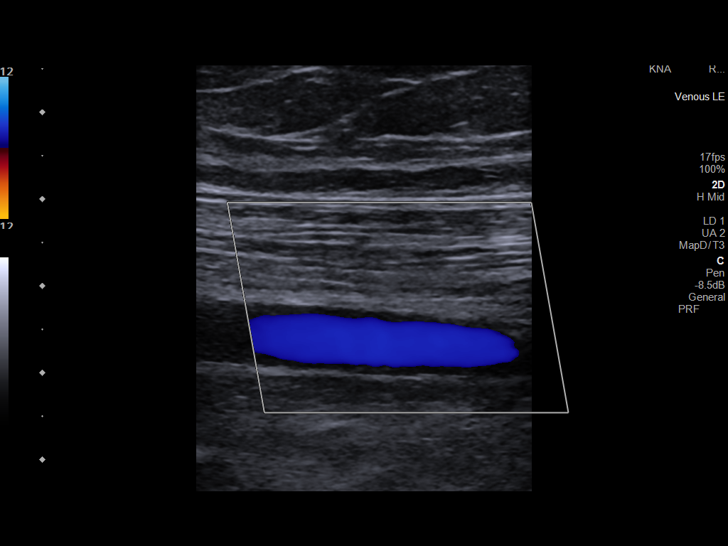
[im 23/86]
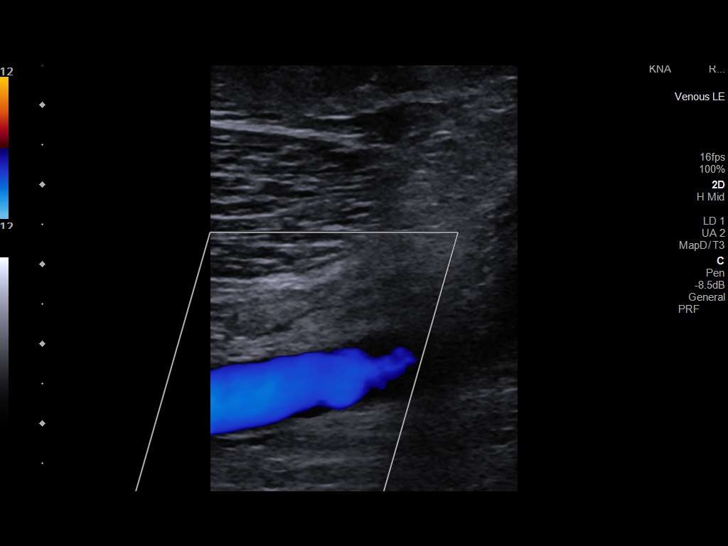
[im 30/86]
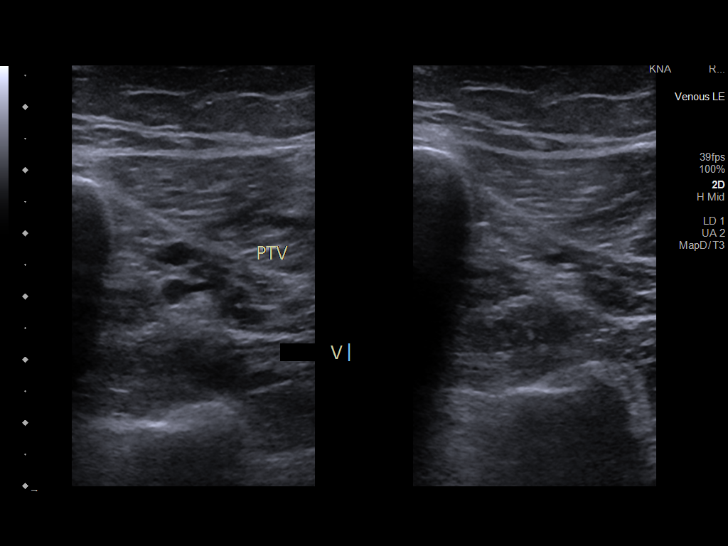
[im 37/86]
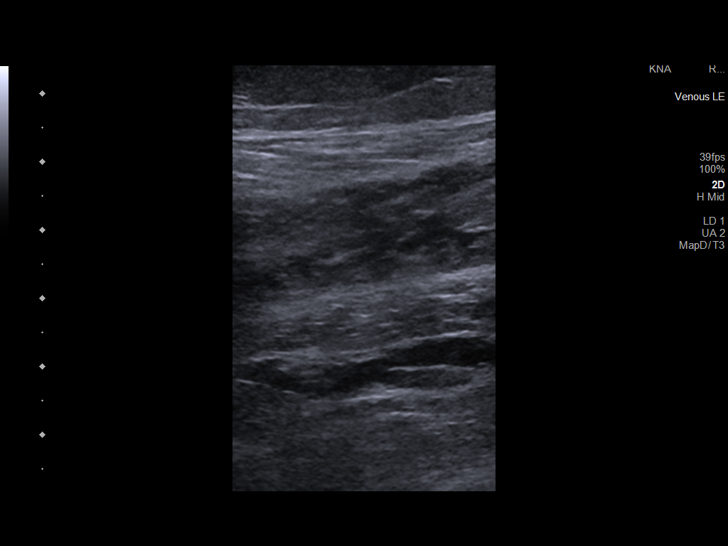
[im 45/86]
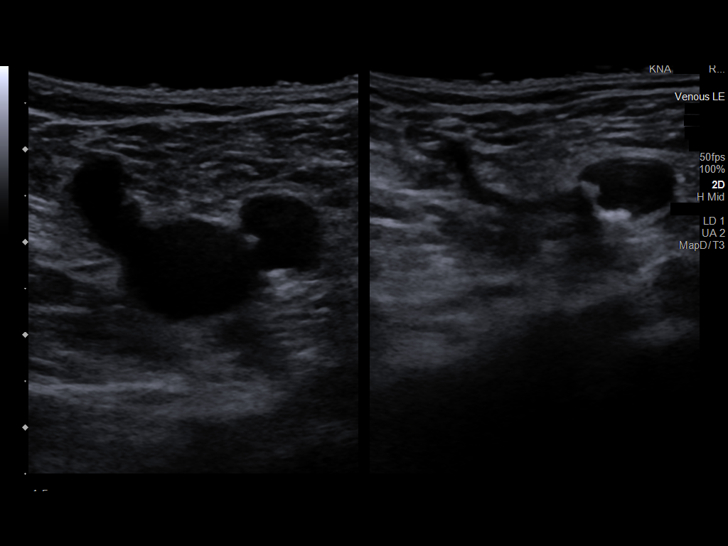
[im 49/86]
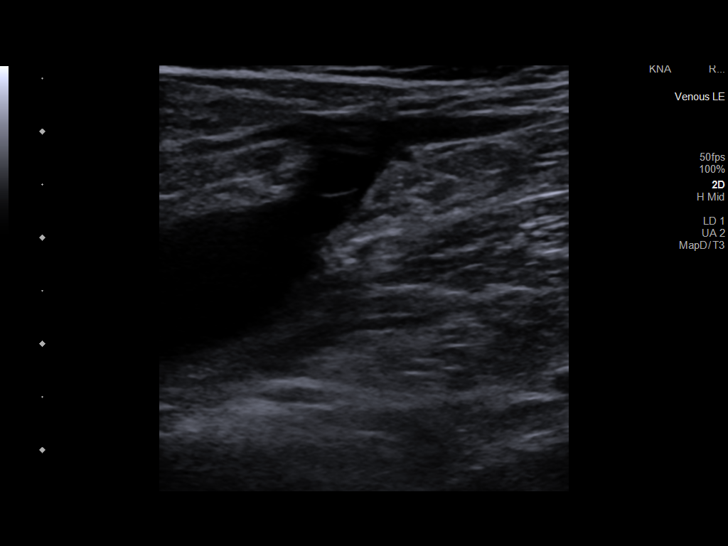
[im 56/86]
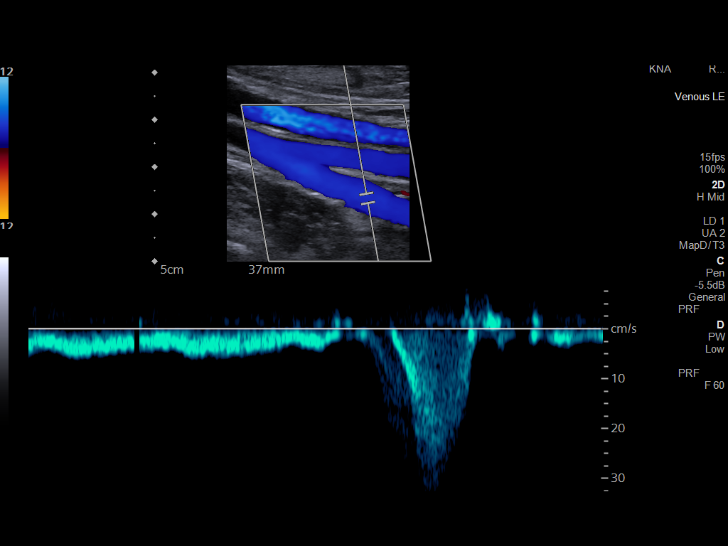
[im 63/86]
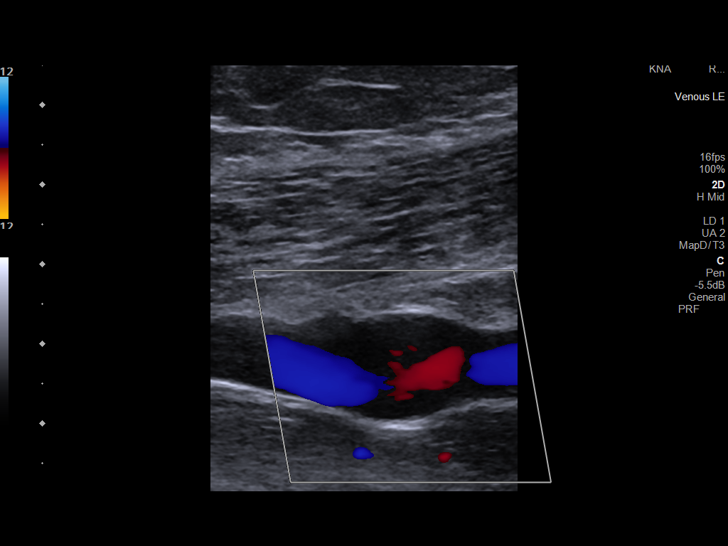
[im 71/86]
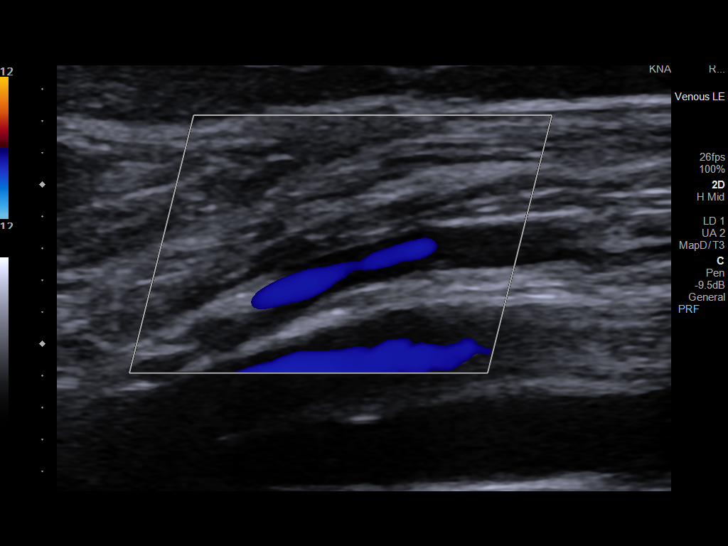
[im 78/86]
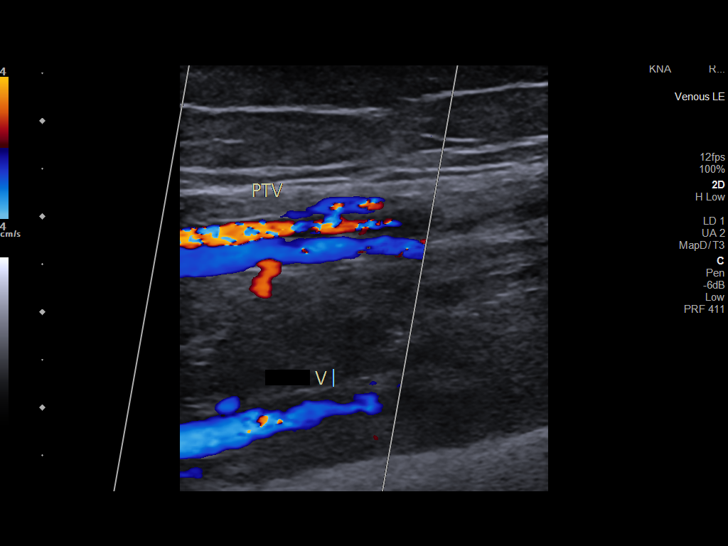
[im 86/86]
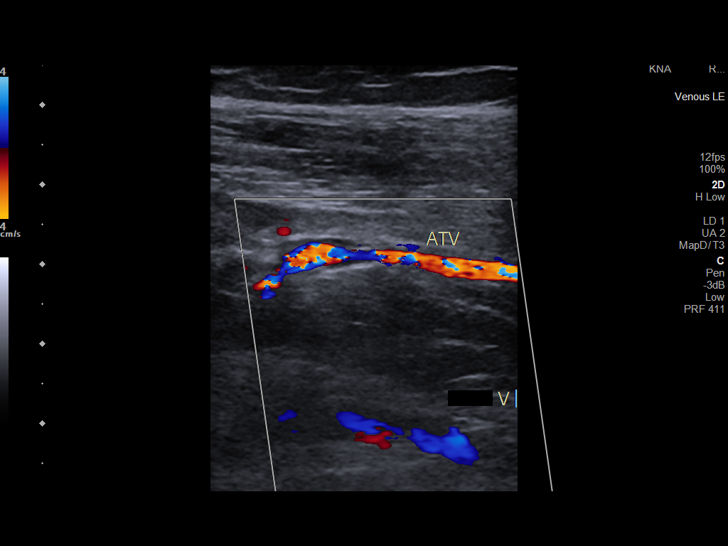

[13 of 24 positions shown; findings below may reference images not displayed]

FINDINGS: RIGHT LOWER EXTREMITY

Common Femoral Vein: No evidence of thrombus. Normal
compressibility, respiratory phasicity and response to augmentation.

Saphenofemoral Junction: No evidence of thrombus. Normal
compressibility and flow on color Doppler imaging.

Profunda Femoral Vein: No evidence of thrombus. Normal
compressibility and flow on color Doppler imaging.

Femoral Vein: No evidence of thrombus. Normal compressibility,
respiratory phasicity and response to augmentation.

Popliteal Vein: No evidence of thrombus. Normal compressibility,
respiratory phasicity and response to augmentation.

Calf Veins: No evidence of thrombus. Normal compressibility and flow
on color Doppler imaging.

LEFT LOWER EXTREMITY

Common Femoral Vein: No evidence of thrombus. Normal
compressibility, respiratory phasicity and response to augmentation.

Saphenofemoral Junction: No evidence of thrombus. Normal
compressibility and flow on color Doppler imaging.

Profunda Femoral Vein: No evidence of thrombus. Normal
compressibility and flow on color Doppler imaging.

Femoral Vein: No evidence of thrombus. Normal compressibility,
respiratory phasicity and response to augmentation.

Popliteal Vein: No evidence of thrombus. Normal compressibility,
respiratory phasicity and response to augmentation.

Calf Veins: No evidence of thrombus. Normal compressibility and flow
on color Doppler imaging.
IMPRESSION: No evidence of deep venous thrombosis in either lower extremity.

## 2022-05-03 ENCOUNTER — Ambulatory Visit (HOSPITAL_BASED_OUTPATIENT_CLINIC_OR_DEPARTMENT_OTHER): Payer: Medicare HMO | Admitting: Family

## 2022-05-03 ENCOUNTER — Encounter (HOSPITAL_BASED_OUTPATIENT_CLINIC_OR_DEPARTMENT_OTHER): Payer: Self-pay | Admitting: Family

## 2022-05-03 VITALS — BP 123/82 | HR 74 | Ht 65.5 in | Wt 181.5 lb

## 2022-05-03 DIAGNOSIS — Z8673 Personal history of transient ischemic attack (TIA), and cerebral infarction without residual deficits: Secondary | ICD-10-CM | POA: Diagnosis not present

## 2022-05-03 DIAGNOSIS — E785 Hyperlipidemia, unspecified: Secondary | ICD-10-CM | POA: Diagnosis not present

## 2022-05-03 DIAGNOSIS — I10 Essential (primary) hypertension: Secondary | ICD-10-CM

## 2022-05-03 DIAGNOSIS — I700 Atherosclerosis of aorta: Secondary | ICD-10-CM

## 2022-05-03 DIAGNOSIS — I25118 Atherosclerotic heart disease of native coronary artery with other forms of angina pectoris: Secondary | ICD-10-CM | POA: Diagnosis not present

## 2022-05-03 MED ORDER — ATORVASTATIN CALCIUM 40 MG PO TABS: 40.0000 mg | ORAL_TABLET | Freq: Every day | ORAL | 3 refills | Status: DC

## 2022-05-03 MED ORDER — CARVEDILOL 6.25 MG PO TABS: 6.2500 mg | ORAL_TABLET | Freq: Two times a day (BID) | ORAL | 3 refills | Status: DC

## 2022-05-03 NOTE — Patient Instructions (Signed)
Medication Instructions:  Continue your current medications.   *If you need a refill on your cardiac medications before your next appointment, please call your pharmacy*   Lab Work: None ordered today.   Testing/Procedures: None ordered today.   Follow-Up: At [facility], you and your health needs are our priority.  As part of our continuing mission to provide you with exceptional heart care, we have created designated Provider Care Teams.  These Care Teams include your primary Cardiologist (physician) and Advanced Practice Providers (APPs -  Physician Assistants and Nurse Practitioners) who all work together to provide you with the care you need, when you need it.  We recommend signing up for the patient portal called "MyChart".  Sign up information is provided on this After Visit Summary.  MyChart is used to connect with patients for Virtual Visits (Telemedicine).  Patients are able to view lab/test results, encounter notes, upcoming appointments, etc.  Non-urgent messages can be sent to your provider as well.   To learn more about what you can do with MyChart, go to [email or web address].    Your next appointment:   6 month(s)  The format for your next appointment:   In Person or Virtual Visit  Provider:   Buford Dresser, MD or Laurann Montana, NP    Other Instructions  Heart Healthy Diet Recommendations: A low-salt diet is recommended. Meats should be grilled, baked, or boiled. Avoid fried foods. Focus on lean protein sources like fish or chicken with vegetables and fruits. The American Heart Association is a Microbiologist!  American Heart Association Diet and Lifeystyle Recommendations   Exercise recommendations: The American Heart Association recommends 150 minutes of moderate intensity exercise weekly. Try 30 minutes of moderate intensity exercise 4-5 times per week. This could include walking, jogging, or swimming.  Important Information About  Sugar

## 2022-05-03 NOTE — Progress Notes (Signed)
Office Visit    Patient Name: [name] Date of Encounter: 05/03/2022  PCP:  [facility], [place]  Cardiologist:  Buford Dresser, MD  Advanced Practice Provider:  No care team member to display Electrophysiologist:  None      Chief Complaint    [name] is a 56 y.o. female with a hx of CVA, hypertension, nonobstructive CAD, hyperlipidemia, prior tobacco abuse, Addison's disease presents today for CAD follow up  Past Medical History    Past Medical History:  Diagnosis Date   Addison's disease ([place])    Addison's disease ([place])    Adrenal insufficiency ([place])    diagnosed 2012   Aneurysm ([place])    Anxiety    Arthritis    Astigmatism    CAD (coronary artery disease)    Cath 2008 EF normal. RCA 50-60, Septal 50%. Myoview 3/12: EF 53% normal perfusion   Cardiac arrest ([place])    2/2 adissonian crisis   Cardiomyopathy    resolved   Chest pain    chronicc   CHF (congestive heart failure) (HCC)    Chronic back pain    Chronic diarrhea    Concussion    sept 28th 2014   Family history of breast cancer    Family history of colon cancer    Gastroparesis    GERD (gastroesophageal reflux disease)    Headache    migraines   HTN (hypertension)    Hyperlipidemia    Hypothyroidism    Mitral valve prolapse    Nondiabetic gastroparesis    PONV (postoperative nausea and vomiting)    Pre-diabetes    QT prolongation    Stroke [facility])    possible stroke 01/2021   Tobacco abuse    down to 2 cigarettes per day   Vertigo    Past Surgical History:  Procedure Laterality Date   ABDOMINAL HYSTERECTOMY     CARDIAC CATHETERIZATION  03/07/2007   showed 60% lesion in the right coronary artery   CHOLECYSTECTOMY     COLONOSCOPY     LEFT HEART CATHETERIZATION WITH CORONARY ANGIOGRAM N/A 04/13/2012   Procedure: LEFT HEART CATHETERIZATION WITH CORONARY ANGIOGRAM;  Surgeon: Hillary Bow, MD;  Location: [facility] CATH LAB;  Service:  Cardiovascular;  Laterality: N/A;   LUMBAR LAMINECTOMY/DECOMPRESSION MICRODISCECTOMY Left 12/20/2018   Procedure: Left Lumbar Three-Four Lumbar Laminotomy and Foraminotomy, Lumbar Four-Five Laminotomy and Foraminotomy with Microdiscectomy and Resection of Synovial Cyst;  Surgeon: Jovita Gamma, MD;  Location: [place];  Service: Neurosurgery;  Laterality: Left;  Left Lumbar 3-4 Lumbar laminotomy, foraminotomy, possible microdiscectomy with possible resection of synovial cyst   SPINE SURGERY     VARICOSE VEIN SURGERY     VESICOVAGINAL FISTULA CLOSURE W/ TAH      Allergies  Allergies  Allergen Reactions   Bee Venom Anaphylaxis   Doxycycline Other (See Comments) and Nausea Only    Due to Pre-Existing conditions involved with stomach, patient does not take the following medication   Erythromycin Other (See Comments) and Nausea And Vomiting    Due to Pre-Existing conditions involved with stomach, patient does not take the following medication   Ibuprofen Nausea Only    gastroparesis    Penicillins Anaphylaxis and Swelling    Has patient had a PCN reaction causing immediate rash, facial/tongue/throat swelling, SOB or lightheadedness with hypotension: Yes Has patient had a PCN reaction causing severe rash involving mucus membranes or skin necrosis: Yes Has patient had a PCN  reaction that required hospitalization No Has patient had a PCN reaction occurring within the last 10 years: No If all of the above answers are "NO", then may proceed with Cephalosporin use.    Sulfa Antibiotics Hives and Itching   Cat Hair Extract Hives, Itching and Swelling    SWELLING REACTION UNSPECIFIED    Dust Mite Extract Hives and Swelling   Tramadol Diarrhea and Nausea And Vomiting    Other reaction(s): Unknown   Sulfamethoxazole-Trimethoprim     Other reaction(s): Unknown    History of Present Illness    [name] is a 56 y.o. female with a hx of hypertension, hyperlipidemia, nonobstructive CAD, CVA  01/2021, prior tobacco use, Addison's, neuropathy last seen 03/03/22  [name], grade 1 diastolic dysfunction.   [place] 01/08/2020 with breast attenuation but no ischemia, low risk study.  Echo 10/2020 EF 55 to 56%, grade 1 diastolic dysfunction, normal RV function, no significant valvular disease, normal wall motion.  She was started on Lasix due to lower extremity edema.  Of note she was on steroids at the time due to her Addison's disease.  Hospitalized 01/2021 due to acute CTA and found to have "unimpressive " punctate acute or subacute infarction.  EEG with left temporal epileptic form discharges.  She was transitioned from aspirin to Plavix.   Prior patient of Dr. Debara Pickett who established Dr. Harrell Gave 04/2021.  At that time she was recovering from lumbar fusion.  She was doing overall well from a cardiac perspective and no changes were made.  At visit 01/29/2022 cardiac CTA ordered due to chest pain.  She was also started on amlodipine for elevated blood pressure.  However when she presented for cardiac CTA she was found to be markedly hypotensive.  It was unable to be completed.  Urgent care visit 02/24/22 with UTI and dizziness consistent with vertigo.  She was treated with prednisone, Cipro, as needed meclizine.  At follow up 03/03/22 recommended to proceed with CT. amlodipine discontinued due to hypotension.  Cardiac CT 03/25/22 coronary calcium score 337 placing her in the 90th percentile for age, sex, race matched controls.  Overall minimal atherosclerosis (LAD, RCA less than 25% stenosis)  She presents today for follow-up.  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  She has 2 other children and grandchildren who also live locally. She saw her primary care doctor last month and her white blood cell count was elevated immediately after her pneumonia and ear infection, kidney function slightly abnormal.  Her primary care recommended her stop NSAIDS. We discussed that ASA '81mg'$  QD still recommended and that her renal function was actually improving. Her lightheadedness and dizziness have resolved. BP has been well controlled at home. NO chest pain, pressure, tightness, exertional dyspnea. Shares with me that her daughter is pending hysterectomy and placement of ileostomy in October as suprapubic catheter no longer working. Also excited as she has been approved for 720 respite hours due to her daughter's paraplegia.   EKGs/Labs/Other Studies Reviewed:   The following studies were reviewed today:  Cardiac CTA 03/2022 IMPRESSION: 1. Coronary calcium score of 337. This was 98th percentile for age-, sex, and race-matched controls.   2.  Normal coronary origin with right dominance.   3.  Minimal atherosclerosis.  CAD RADS 1.   4.  Consider non atherosclerotic causes of chest pain.   5.  Recommend preventive therapy and risk factor modification.  CT  abd pelvis 12/31/21 IMPRESSION: No acute intra-abdominal pathology identified. No definite radiographic explanation for the patient's reported symptoms.   Progressive intra and extrahepatic biliary ductal dilation. While this may represent post cholecystectomy change, this appears progressive since prior examination of 01/25/2021. Moreover, there is prominent soft tissue seen at the ampulla and a distal obstructing lesion such as ampullary stenosis or ampullary mass is not excluded. Correlation with liver enzymes is recommended to exclude an obstructive process. MRI/MRCP or ERCP examination may be more helpful for further evaluation.   Moderate stool burden without evidence of obstruction.   Aortic Atherosclerosis (ICD10-I70.0).  None    EKG:  No EKG today.  Recent Labs: 01/29/2022: ALT 32 03/03/2022: TSH 2.600 03/15/2022: BUN 20; Creatinine, Ser 1.15; Hemoglobin 14.1; Platelets 348; Potassium 4.4; Sodium 139  Recent Lipid Panel     Component Value Date/Time   CHOL 137 01/26/2021 0410   TRIG 163 (H) 01/26/2021 0410   HDL 50 01/26/2021 0410   CHOLHDL 2.7 01/26/2021 0410   VLDL 33 01/26/2021 0410   LDLCALC 54 01/26/2021 0410   LDLDIRECT 52 01/29/2022 1533    Home Medications   Current Meds  Medication Sig   albuterol (VENTOLIN HFA) 108 (90 Base) MCG/ACT inhaler Inhale 1-2 puffs into the lungs every 6 (six) hours as needed for wheezing or shortness of breath.    alendronate (FOSAMAX) 70 MG tablet Take 70 mg by mouth every Monday.   ALPRAZolam (XANAX) 0.5 MG tablet Take 0.5 mg by mouth 3 (three) times daily as needed for anxiety.   aspirin EC 81 MG tablet Take 81 mg by mouth daily.   atorvastatin (LIPITOR) 40 MG tablet Take 1 tablet (40 mg total) by mouth daily.   buPROPion (WELLBUTRIN SR) 150 MG 12 hr tablet Take 150 mg by mouth daily.   carvedilol (COREG) 6.25 MG tablet Take 1 tablet (6.25 mg total) by mouth 2 (two) times daily with a meal.   Cholecalciferol (VITAMIN D-1000 MAX ST) 25 MCG (1000 UT) tablet Take 1 tablet by mouth daily.   cyclobenzaprine (FLEXERIL) 10 MG tablet Take 10 mg by mouth 3 (three) times daily as needed for muscle spasms.   EPINEPHrine 0.3 mg/0.3 mL IJ SOAJ injection Inject 0.3 mg into the muscle as needed for anaphylaxis.   gabapentin (NEURONTIN) 600 MG tablet Take 600 mg by mouth in the morning, at noon, in the evening, and at bedtime.   hydrocortisone (CORTEF) 10 MG tablet Take 10-20 mg by mouth See admin instructions. Takes 20 mg by mouth in the morning and 10 mg at night   levothyroxine (SYNTHROID) 75 MCG tablet Take 75 mcg by mouth daily before breakfast.   meclizine (ANTIVERT) 25 MG tablet 1 or 2 tabs PO q8h prn dizziness (Patient taking differently: Take 25 mg by mouth 3 (three) times daily as needed for dizziness.)   nitroGLYCERIN (NITROSTAT) 0.4 MG SL tablet Place 1 tablet (0.4 mg total) under the tongue every 5 (five) minutes as needed for chest pain.   ondansetron (ZOFRAN) 4 MG  tablet Take 4 mg by mouth every 6 (six) hours as needed for nausea or vomiting.   oxyCODONE 20 MG TABS Take 1 tablet (20 mg total) by mouth every 6 (six) hours as needed for severe pain ((score 7 to 10)).   topiramate (TOPAMAX) 25 MG tablet Take 1 tablet (25 mg total) by mouth 2 (two) times daily.   zolpidem (AMBIEN) 10 MG tablet Take 10 mg by mouth at bedtime.   [DISCONTINUED] buPROPion (  ZYBAN) 150 MG 12 hr tablet Take 150 mg by mouth 2 (two) times daily.   [DISCONTINUED] gabapentin (NEURONTIN) 300 MG capsule Take 300 mg by mouth at bedtime as needed (pain).     Review of Systems      All other systems reviewed and are otherwise negative except as noted above.  Physical Exam    VS:  BP 123/82 (BP Location: Left Arm, Patient Position: Sitting, Cuff Size: Large)   Pulse 74   Ht 5' 5.5" (1.664 m)   Wt 181 lb 8 oz (82.3 kg)   SpO2 94%   BMI 29.74 kg/m  , BMI Body mass index is 29.74 kg/m.  Wt Readings from Last 3 Encounters:  05/03/22 181 lb 8 oz (82.3 kg)  03/15/22 170 lb (77.1 kg)  03/03/22 178 lb (80.7 kg)     GEN: Well nourished, well developed, in no acute distress. HEENT: normal. Neck: Supple, no JVD, carotid bruits, or masses. Cardiac: RRR, no murmurs, rubs, or gallops. No clubbing, cyanosis, edema.  Radials/PT 2+ and equal bilaterally.  Respiratory:  Respirations regular and unlabored, clear to auscultation bilaterally. GI: Soft, nontender, nondistended. MS: No deformity or atrophy. Skin: Warm and dry, no rash. Neuro:  Strength and sensation are intact. Psych: Normal affect.  Assessment & Plan    CAD / Aortic atherosclerosis / HLD, LDL goal <70 -Ninimal nonobstructive disease by cardiac CTA 03/2022. Stable with no anginal symptoms. No indication for ischemic evaluation.  GDMT aspirin, atorvastatin, carvedilol. Refills provided. 01/2022 direct LDL 52.  Heart healthy diet and regular cardiovascular exercise encouraged.    History of CVA - Follows with neurology. Continue  optimal BP, lipid, glucose control. Continue Aspirin, Atorvastatin.   Hypothyroidism / Addison's disease- Managed by endocrinology.  Tobacco use - Smoking cessation encouraged. Recommend utilization of [id number].   Neuropathy -Following with neurology.   HTN -Amlodipine previously discontinued due to hypotension associated with lightheadedness.  BP at goal of less than 130/80.  Continue carvedilol  6.25 mg twice daily.  Discussed to monitor BP at home at least 2 hours after medications and sitting for 5-10 minutes.   Disposition: Follow up in 6 months with Buford Dresser, MD or APP.  Signed, Loel Dubonnet, NP 05/03/2022, 1:51 PM [facility]

## 2022-05-05 DIAGNOSIS — E271 Primary adrenocortical insufficiency: Secondary | ICD-10-CM | POA: Diagnosis not present

## 2022-05-05 DIAGNOSIS — G603 Idiopathic progressive neuropathy: Secondary | ICD-10-CM | POA: Diagnosis not present

## 2022-05-05 DIAGNOSIS — M5416 Radiculopathy, lumbar region: Secondary | ICD-10-CM | POA: Diagnosis not present

## 2022-05-05 DIAGNOSIS — R251 Tremor, unspecified: Secondary | ICD-10-CM | POA: Diagnosis not present

## 2022-05-12 DIAGNOSIS — R7309 Other abnormal glucose: Secondary | ICD-10-CM | POA: Diagnosis not present

## 2022-05-14 DIAGNOSIS — M1991 Primary osteoarthritis, unspecified site: Secondary | ICD-10-CM | POA: Diagnosis not present

## 2022-05-14 DIAGNOSIS — I10 Essential (primary) hypertension: Secondary | ICD-10-CM | POA: Diagnosis not present

## 2022-05-14 DIAGNOSIS — G894 Chronic pain syndrome: Secondary | ICD-10-CM | POA: Diagnosis not present

## 2022-05-18 DIAGNOSIS — R051 Acute cough: Secondary | ICD-10-CM | POA: Diagnosis not present

## 2022-05-18 DIAGNOSIS — U071 COVID-19: Secondary | ICD-10-CM | POA: Diagnosis not present

## 2022-05-24 DIAGNOSIS — M48061 Spinal stenosis, lumbar region without neurogenic claudication: Secondary | ICD-10-CM | POA: Diagnosis not present

## 2022-05-24 DIAGNOSIS — M1991 Primary osteoarthritis, unspecified site: Secondary | ICD-10-CM | POA: Diagnosis not present

## 2022-05-24 DIAGNOSIS — E663 Overweight: Secondary | ICD-10-CM | POA: Diagnosis not present

## 2022-05-24 DIAGNOSIS — Z6828 Body mass index (BMI) 28.0-28.9, adult: Secondary | ICD-10-CM | POA: Diagnosis not present

## 2022-05-24 DIAGNOSIS — G4709 Other insomnia: Secondary | ICD-10-CM | POA: Diagnosis not present

## 2022-05-24 DIAGNOSIS — I10 Essential (primary) hypertension: Secondary | ICD-10-CM | POA: Diagnosis not present

## 2022-05-24 DIAGNOSIS — J209 Acute bronchitis, unspecified: Secondary | ICD-10-CM | POA: Diagnosis not present

## 2022-05-24 DIAGNOSIS — G894 Chronic pain syndrome: Secondary | ICD-10-CM | POA: Diagnosis not present

## 2022-05-24 DIAGNOSIS — M5136 Other intervertebral disc degeneration, lumbar region: Secondary | ICD-10-CM | POA: Diagnosis not present

## 2022-05-26 IMAGING — DX DG CHEST 2V
2 series · 2 of 2 positions shown · non-contrast
Comparison: 06/26/2021

CLINICAL DATA: 55-year-old female with a history productive cough

EXAM:
CHEST - 2 VIEW

[chest pa]
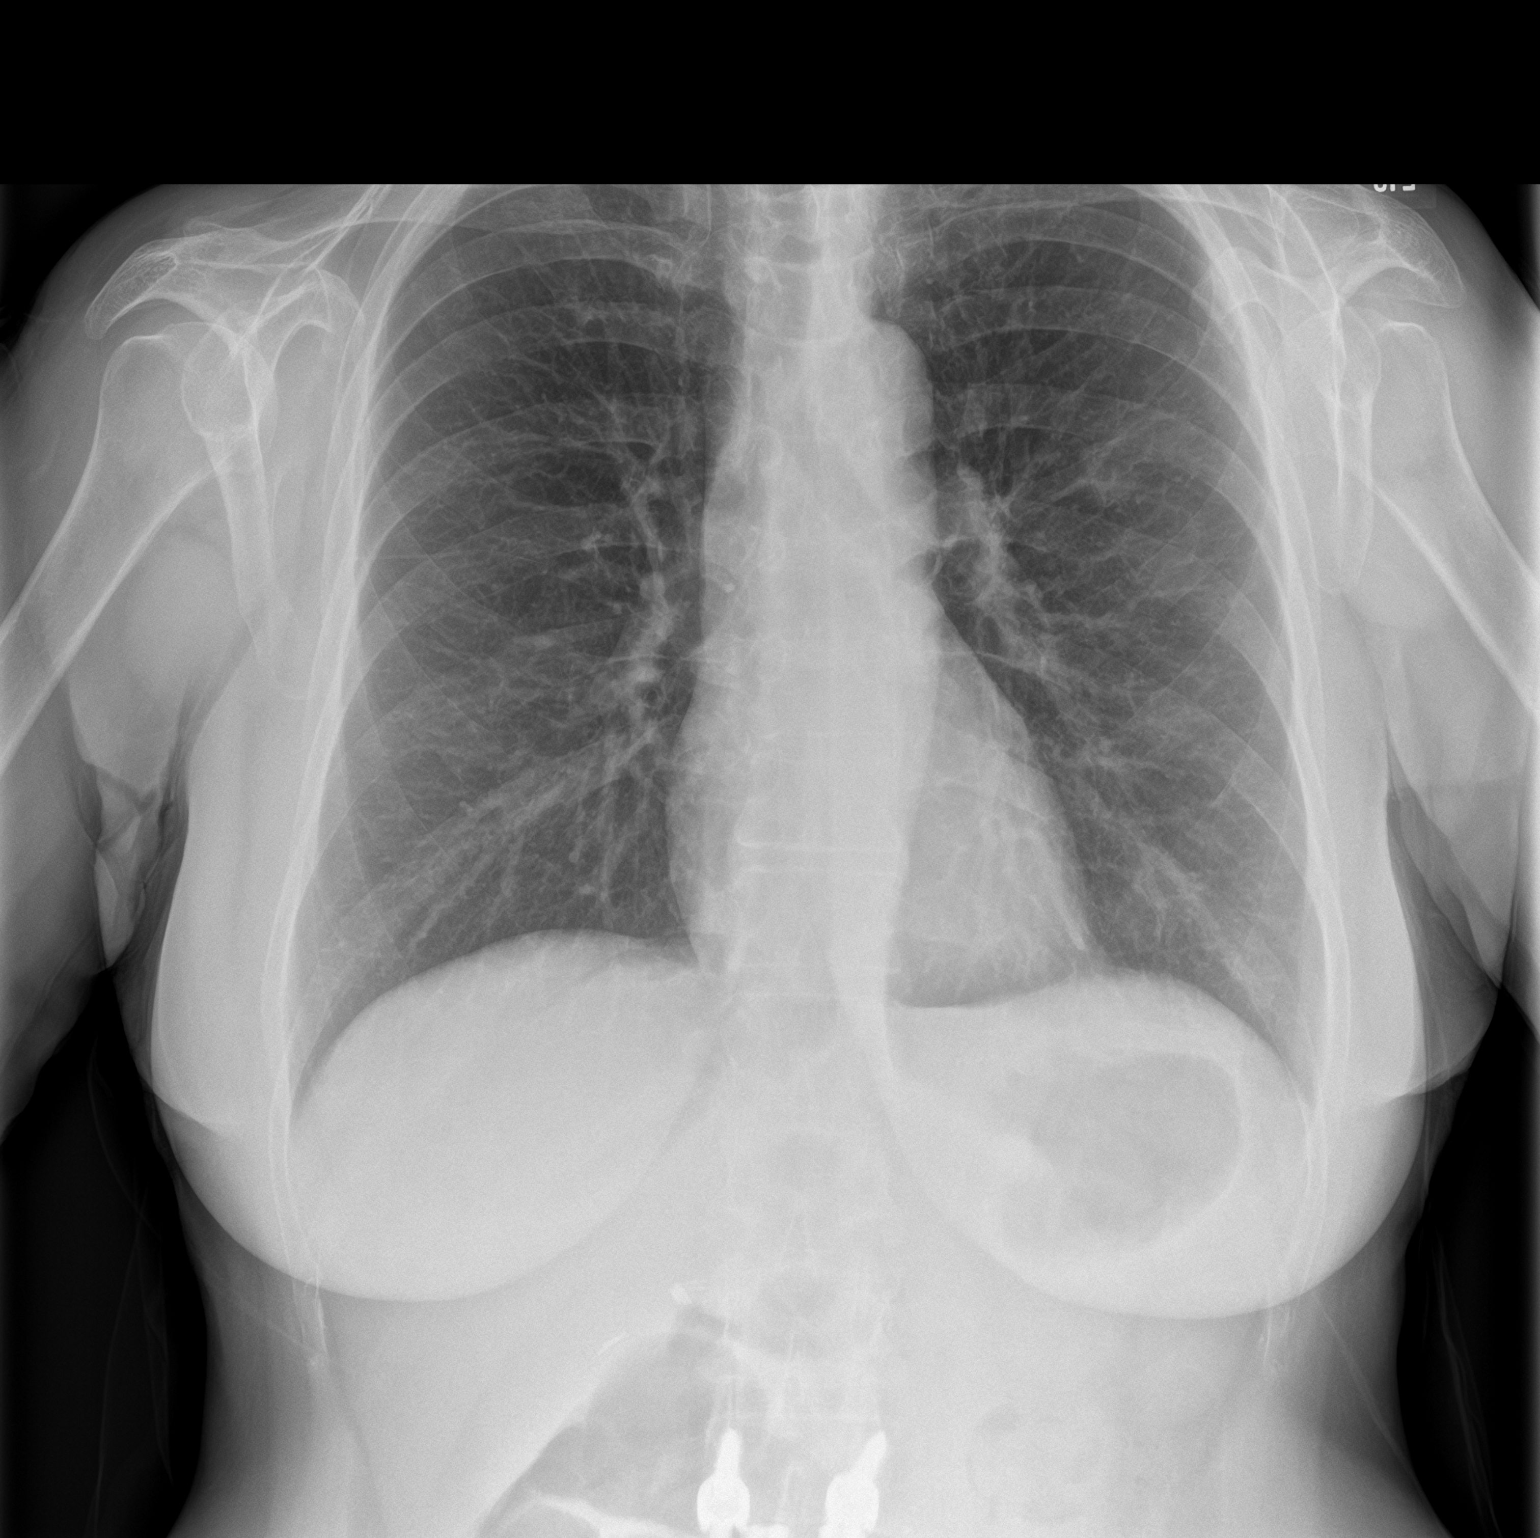

[chest lat]
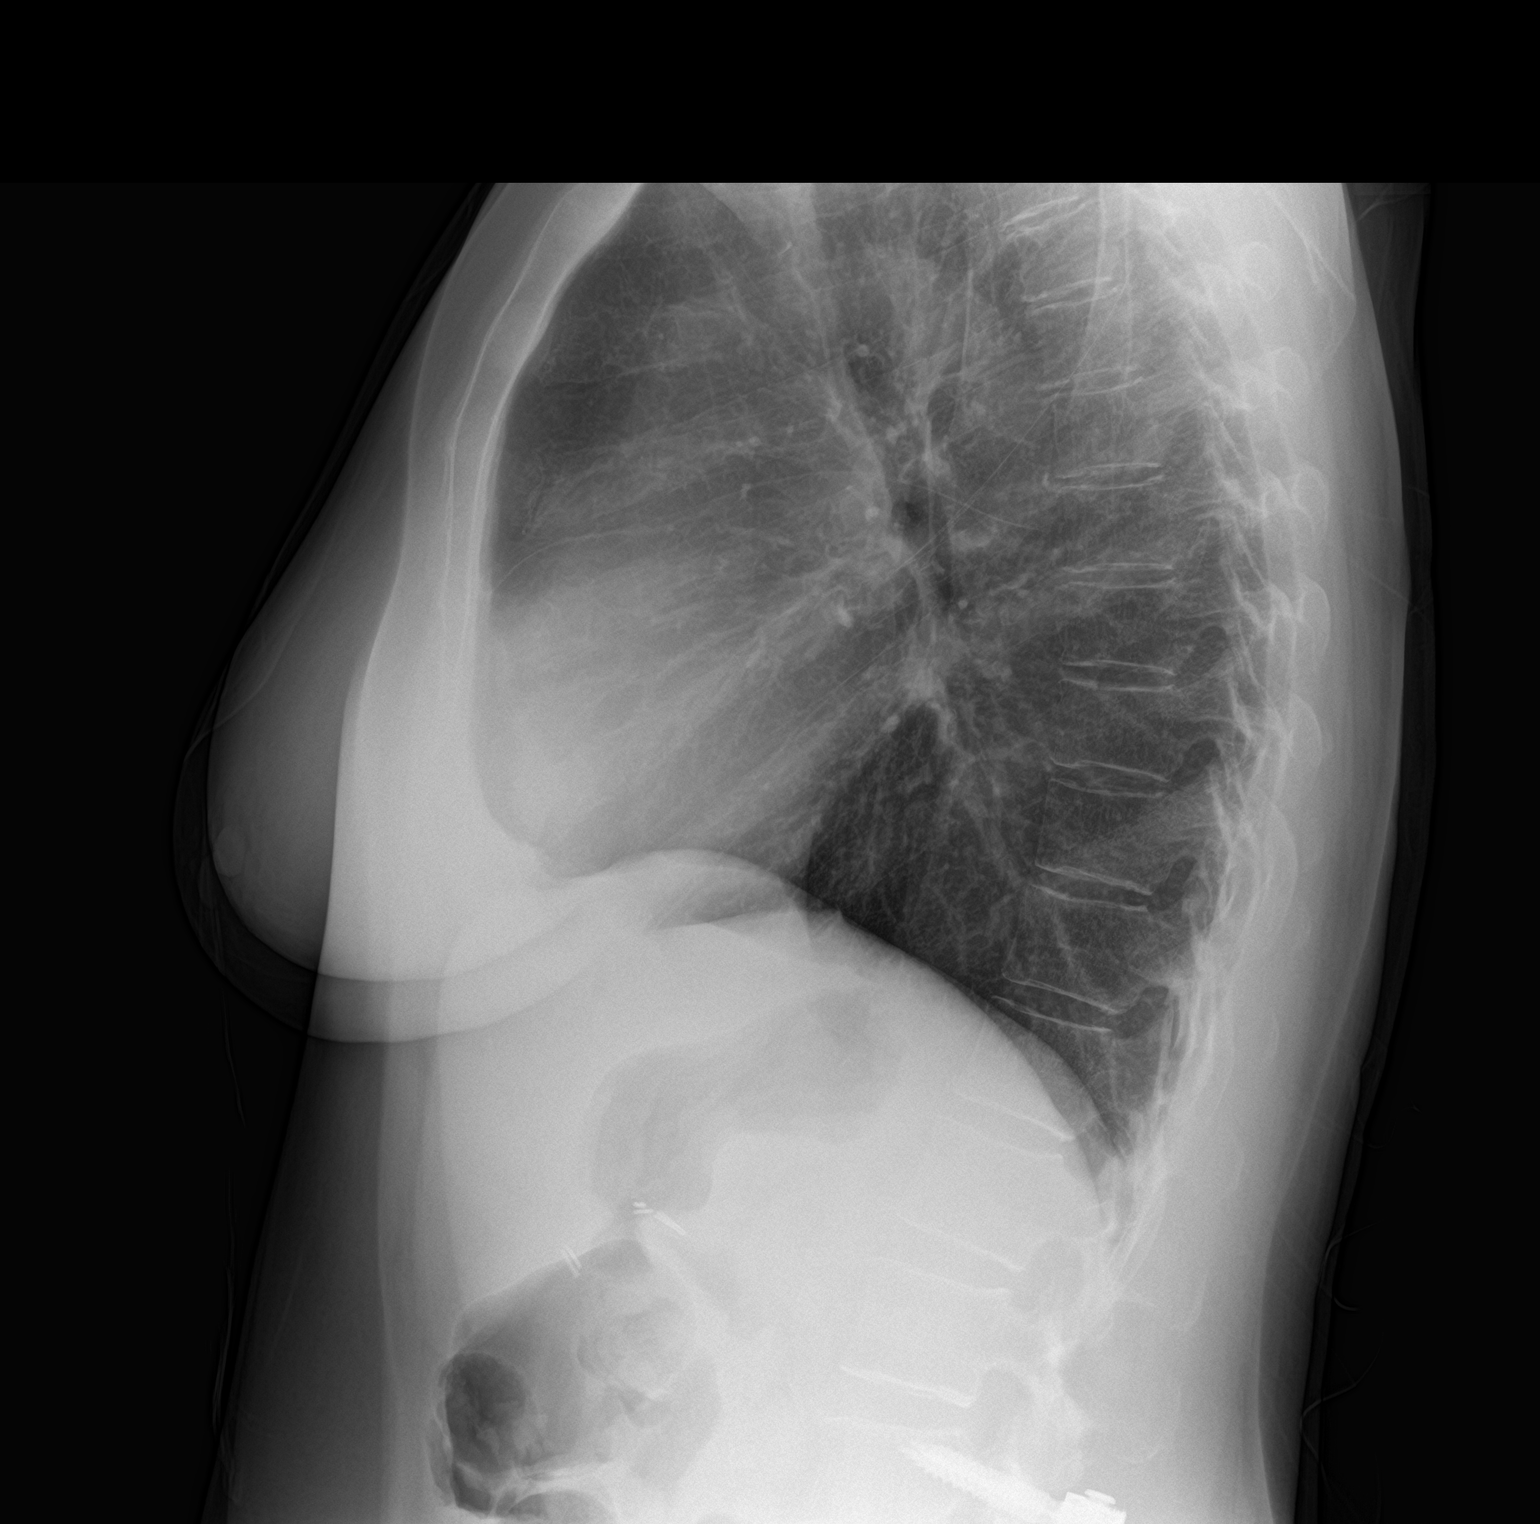

[2 of 2 positions shown; findings below may reference images not displayed]

FINDINGS: Cardiomediastinal silhouette unchanged in size and contour. No
evidence of central vascular congestion. No interlobular septal
thickening.

No pneumothorax or pleural effusion. Coarsened interstitial
markings, with no confluent airspace disease.

No acute displaced fracture. Degenerative changes of the spine.

Surgical changes of the cervical region.
IMPRESSION: Negative for acute cardiopulmonary disease

## 2022-06-02 IMAGING — US US PELVIS COMPLETE WITH TRANSVAGINAL
1 series · 14 of 25 positions shown · non-contrast
Comparison: None

CLINICAL DATA: Right lower quadrant pain for 3 days. Prior
hysterectomy.

EXAM:
TRANSABDOMINAL AND TRANSVAGINAL ULTRASOUND OF PELVIS
TECHNIQUE: Both transabdominal and transvaginal ultrasound examinations of the
pelvis were performed. Transabdominal technique was performed for
global imaging of the pelvis including uterus, ovaries, adnexal
regions, and pelvic cul-de-sac. It was necessary to proceed with
endovaginal exam following the transabdominal exam to visualize the
adnexal structures.

[Series 1: us pelvic complete with transvaginal · 14 of 45 slices shown]
[im 1/45]
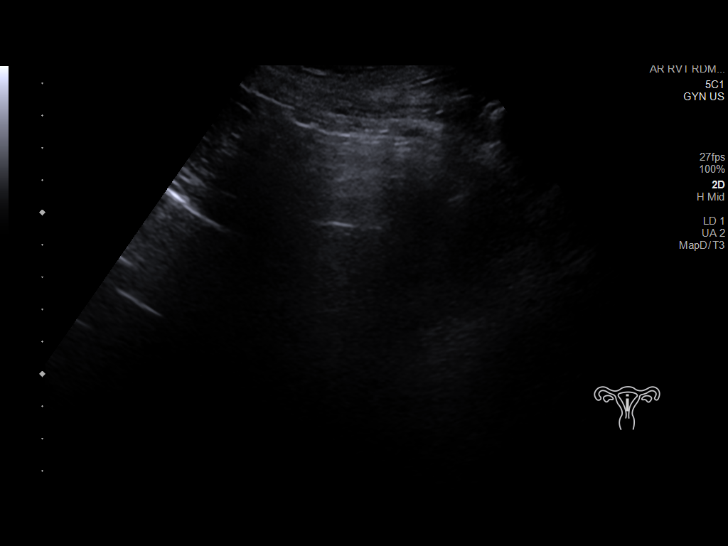
[im 4/45]
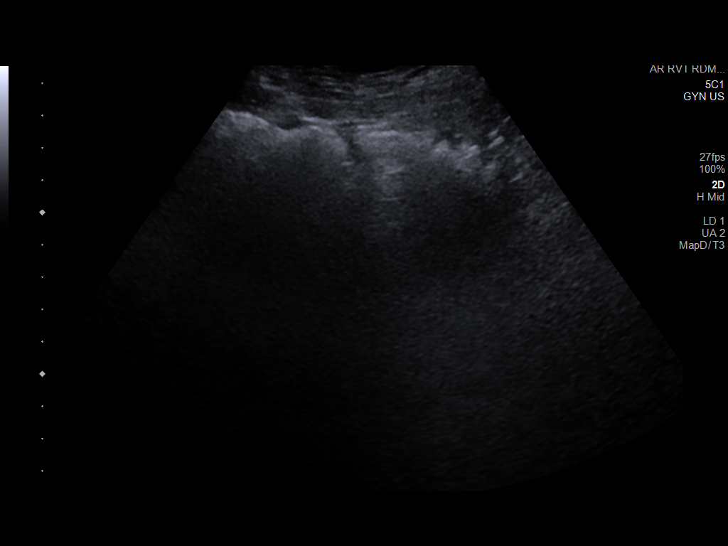
[im 8/45]
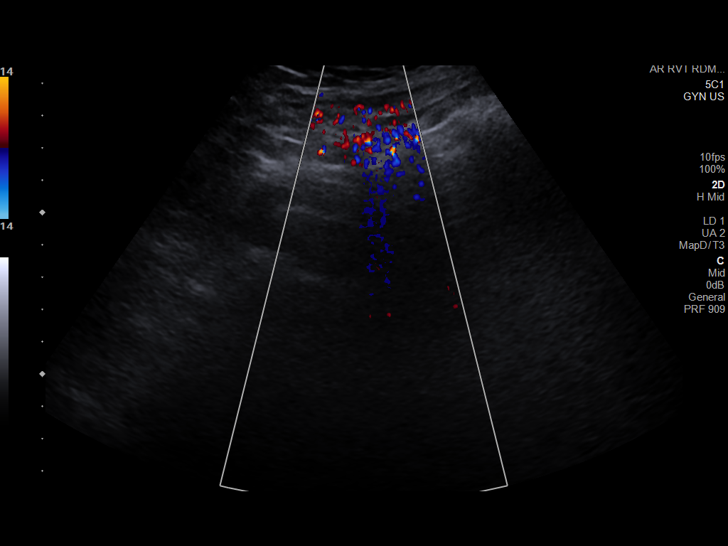
[im 12/45]
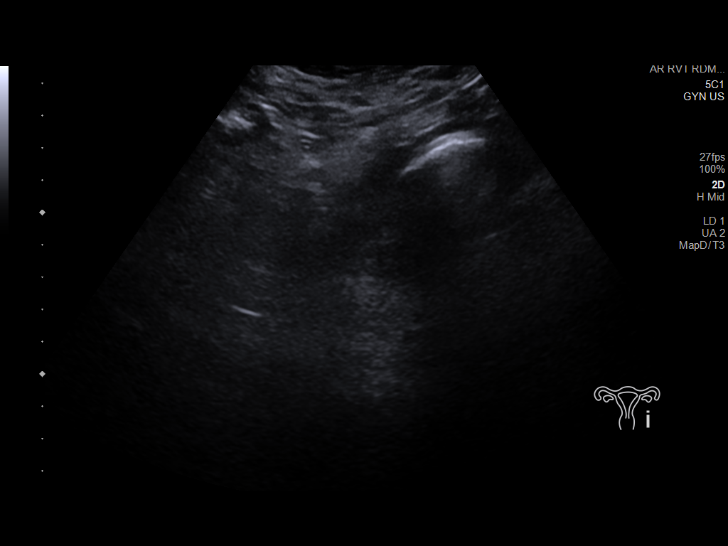
[im 15/45]
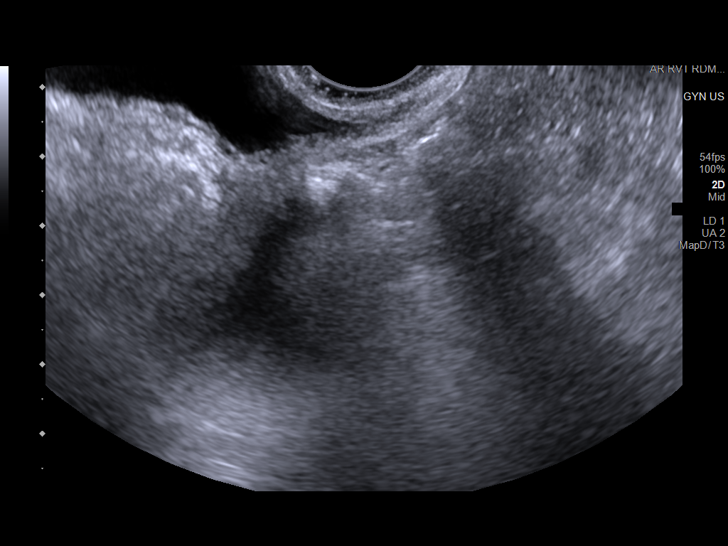
[im 17/45]
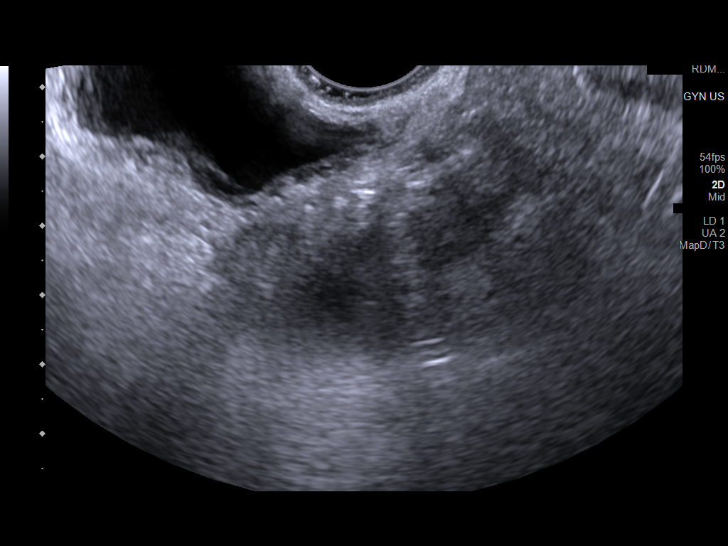
[im 21/45]
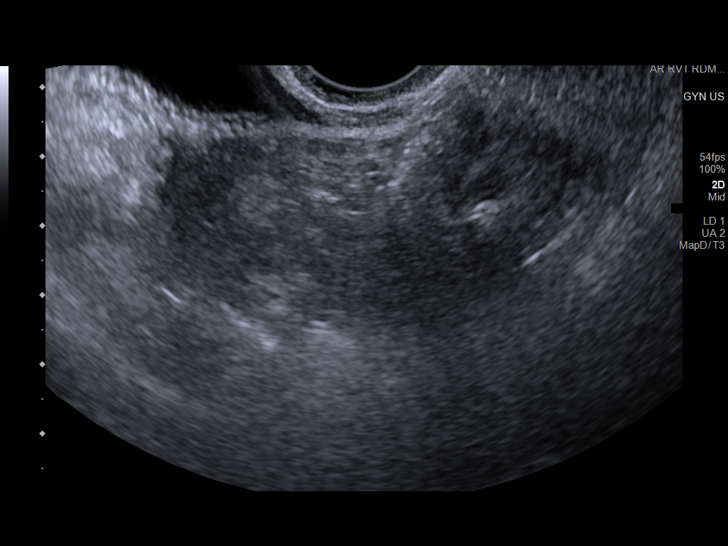
[im 24/45]
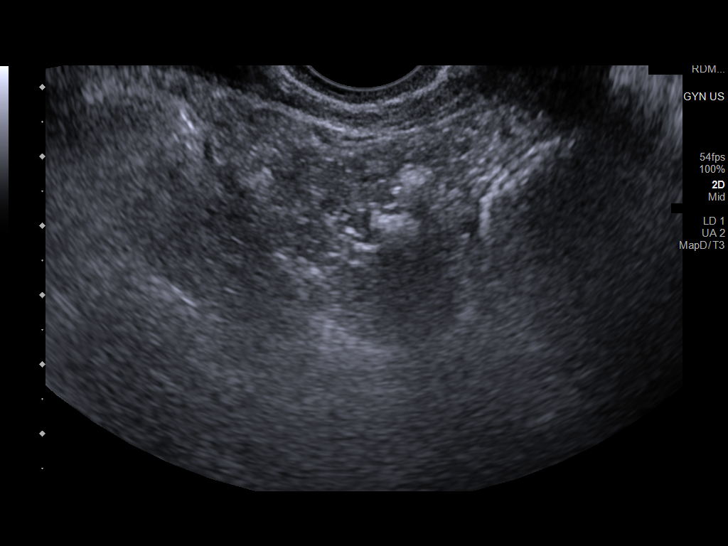
[im 28/45]
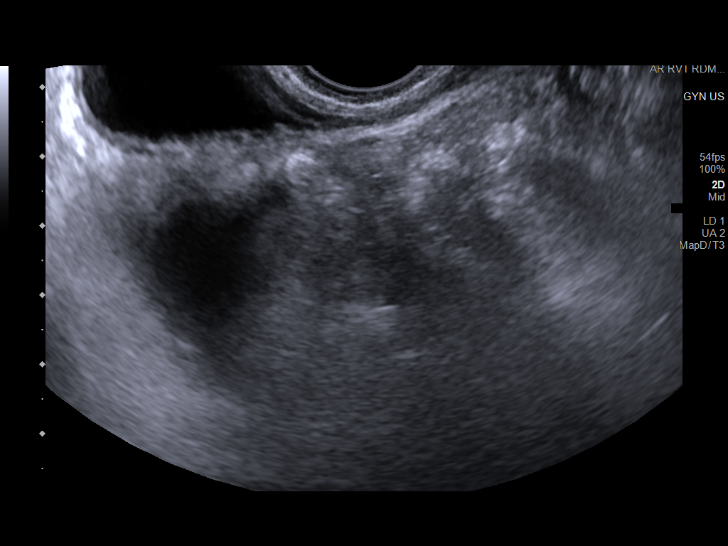
[im 30/45]
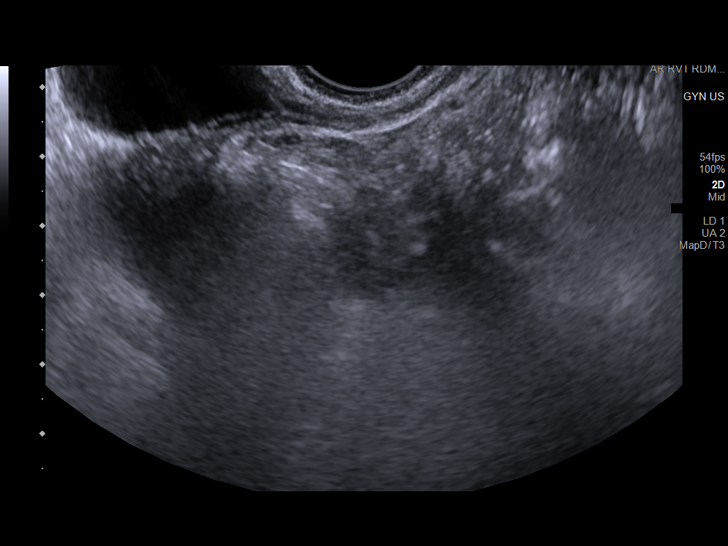
[im 34/45]
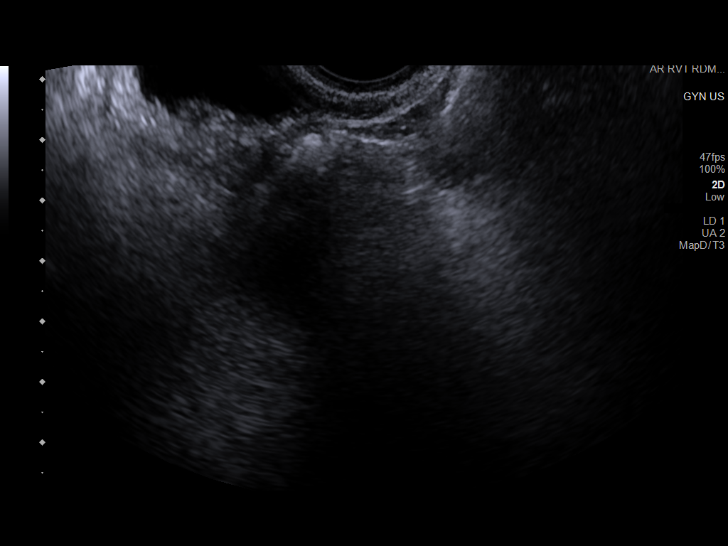
[im 37/45]
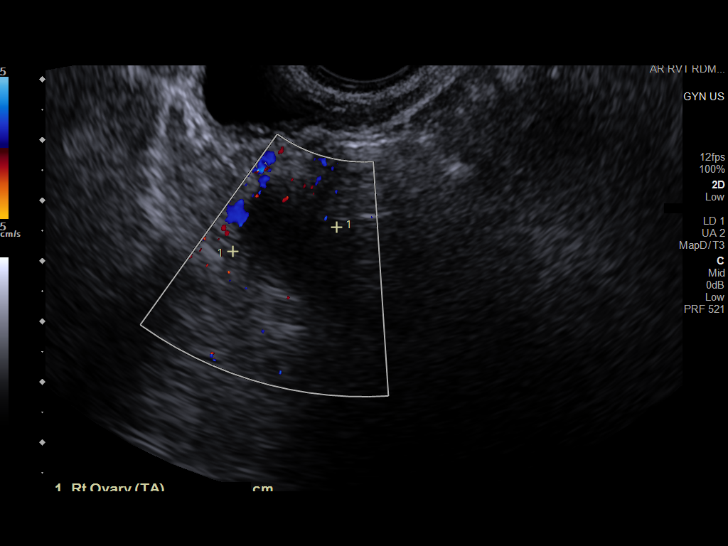
[im 41/45]
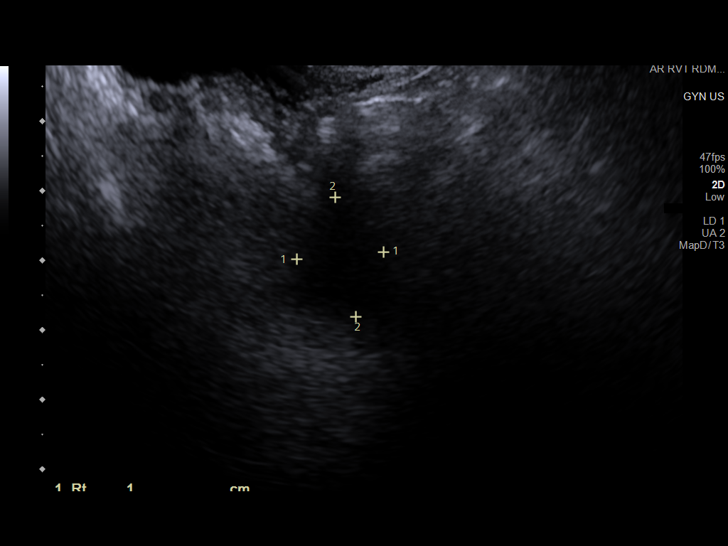
[im 45/45]
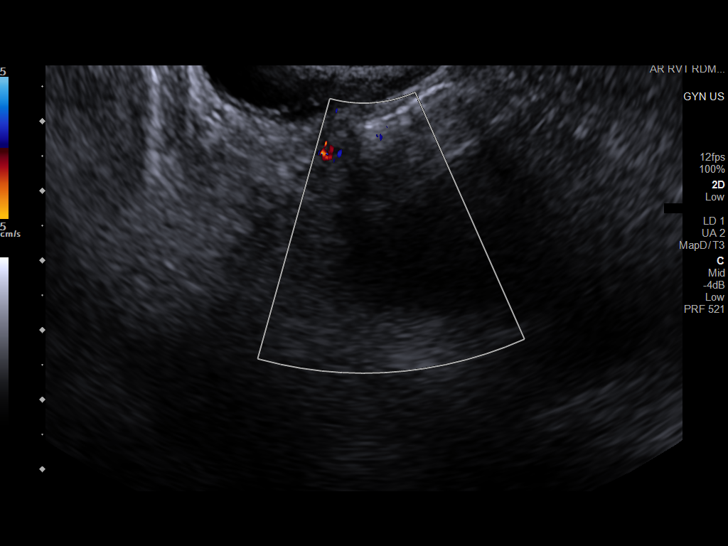

[14 of 25 positions shown; findings below may reference images not displayed]

FINDINGS: Uterus

Surgically absent

Right ovary

Not able to be definitively identified. There is posterior acoustic
shadowing identified within the right lower quadrant however this is
not definitely representative of the right ovary.

Left ovary

Not visualized.

Other findings

No abnormal free fluid.
IMPRESSION: The right and left ovaries are not confidently identified on the
current examination. There is posterior acoustic shadowing
identified within the right lower quadrant which is not definitively
representative of the right ovary. If there is persistent right
lower quadrant pain, recommend further evaluation with CT.

## 2022-06-02 IMAGING — US US ABDOMEN COMPLETE
1 series · 14 of 25 positions shown · non-contrast
Comparison: 04/07/2017, 01/25/2021

CLINICAL DATA: Bilateral chronic flank pain

EXAM:
ABDOMEN ULTRASOUND COMPLETE

[Series 1: us abdomen complete · 14 of 92 slices shown]
[im 1/92]
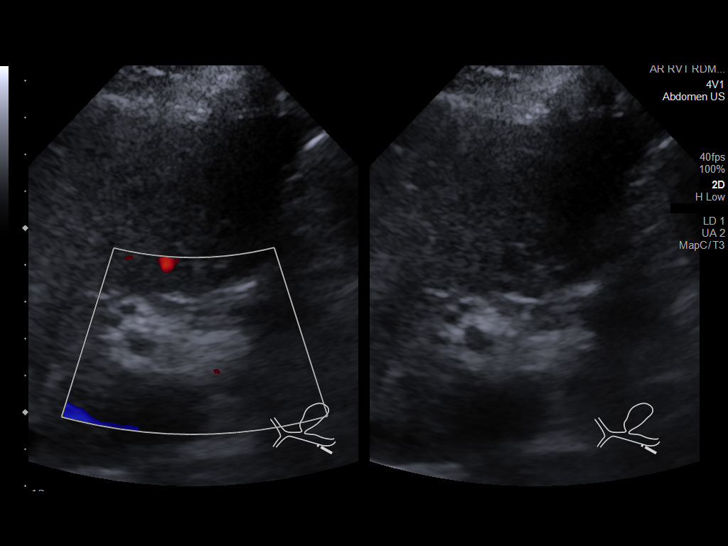
[im 8/92]
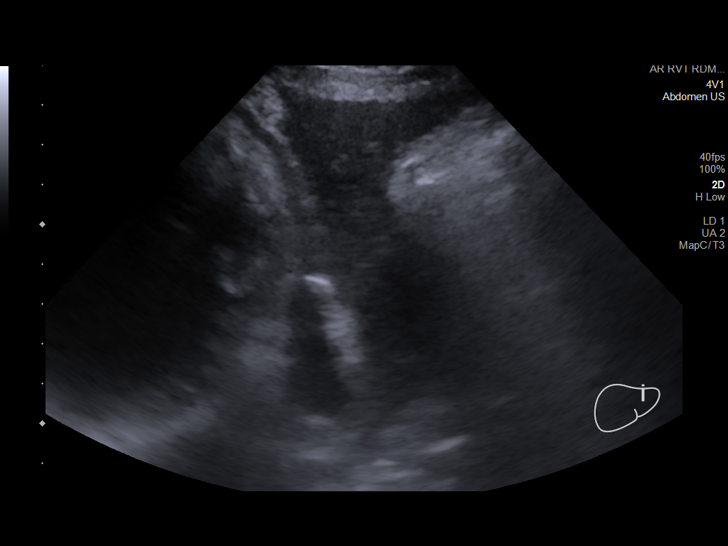
[im 16/92]
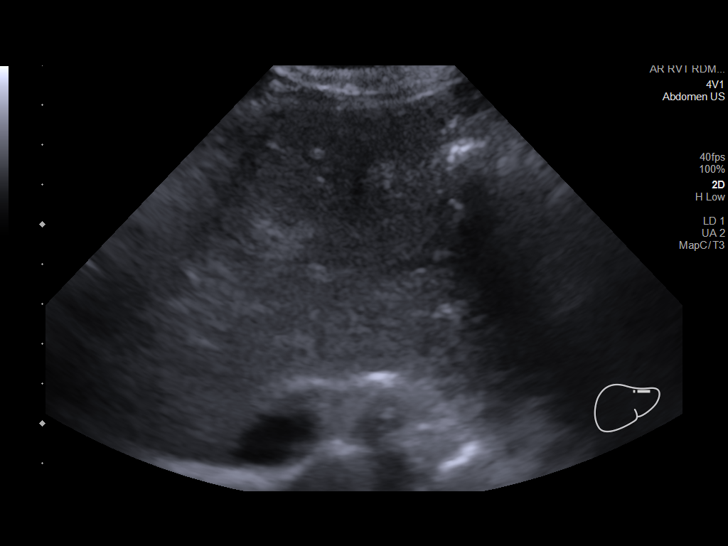
[im 23/92]
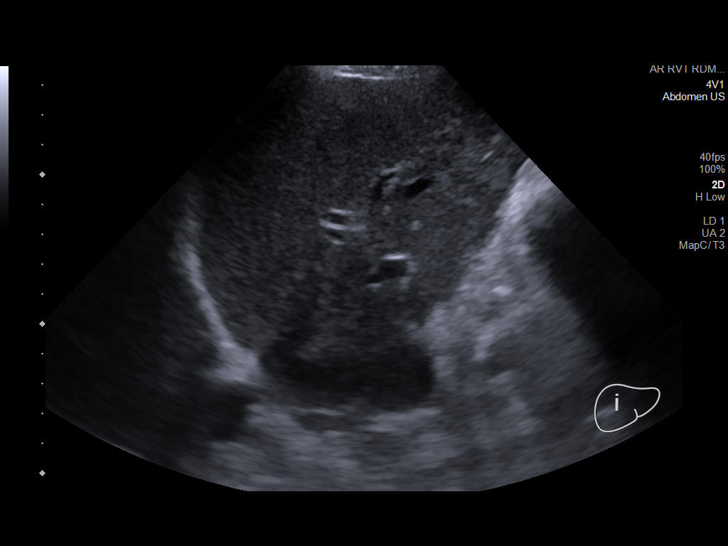
[im 31/92]
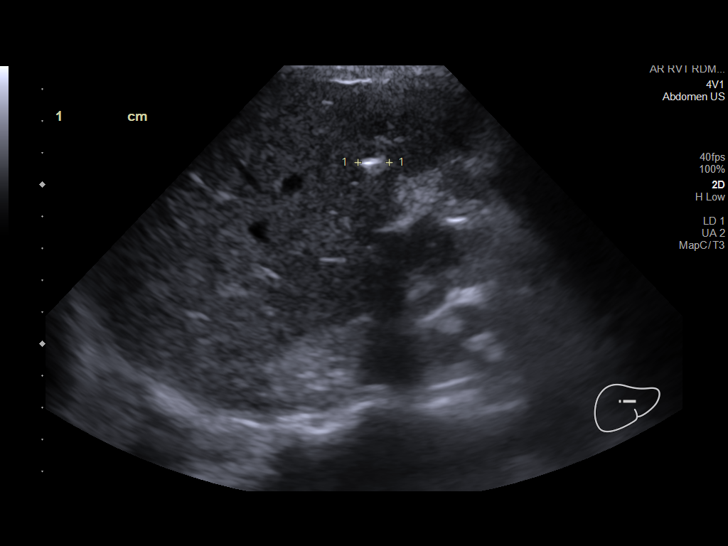
[im 35/92]
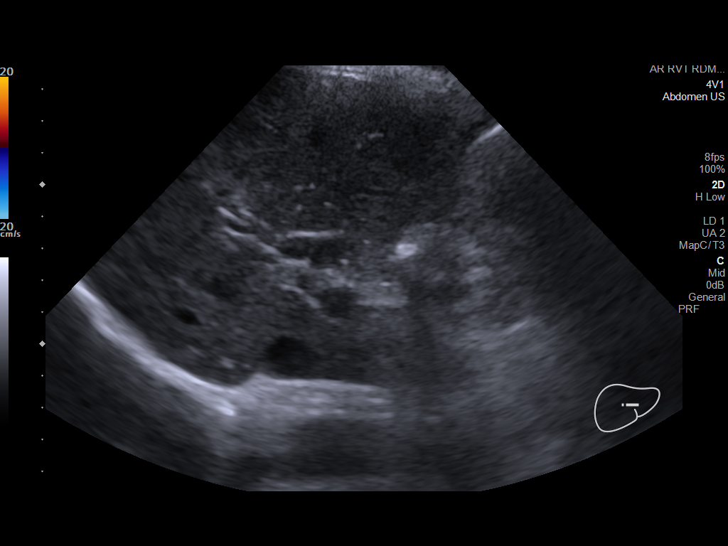
[im 42/92]
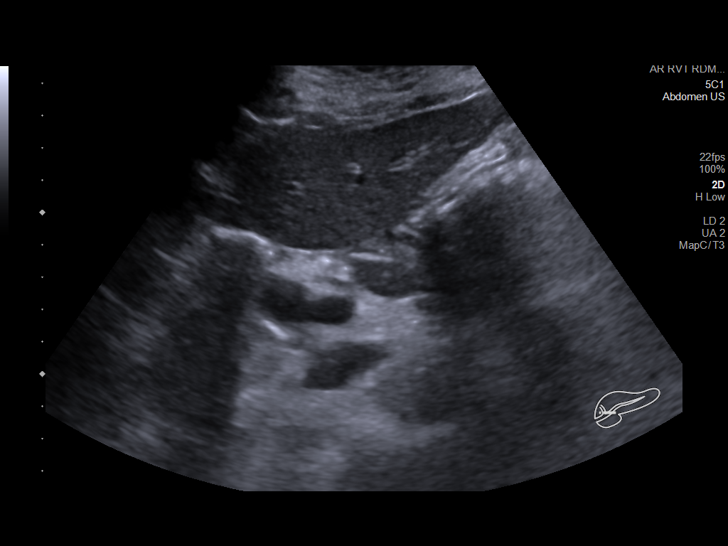
[im 50/92]
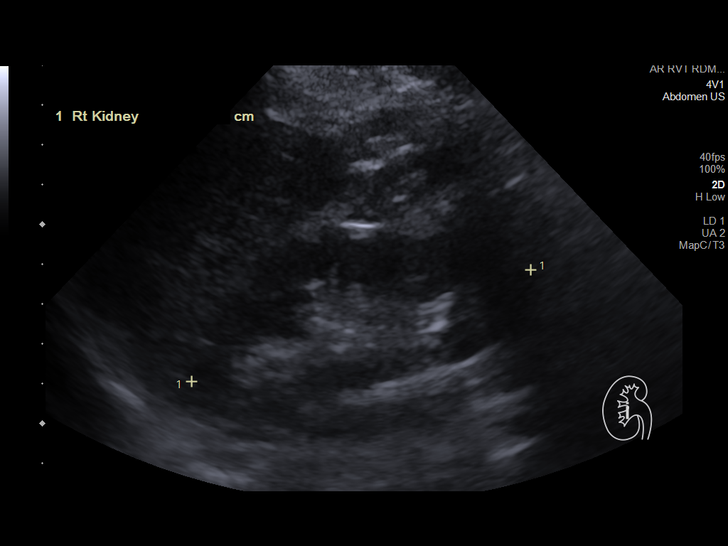
[im 57/92]
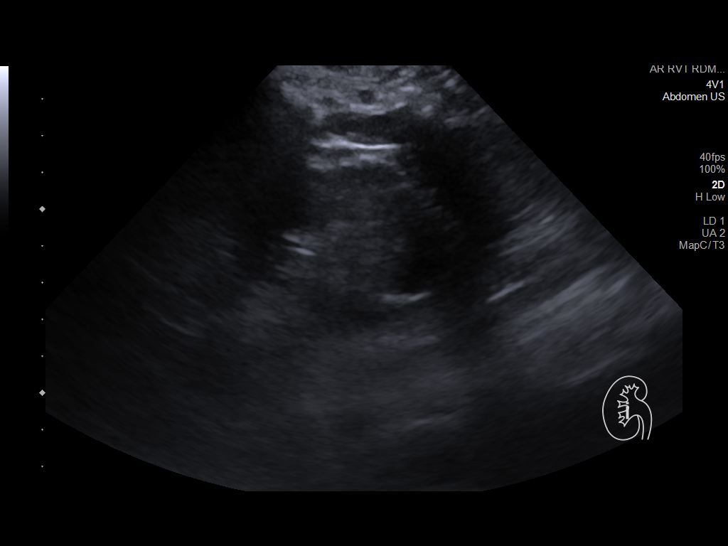
[im 61/92]
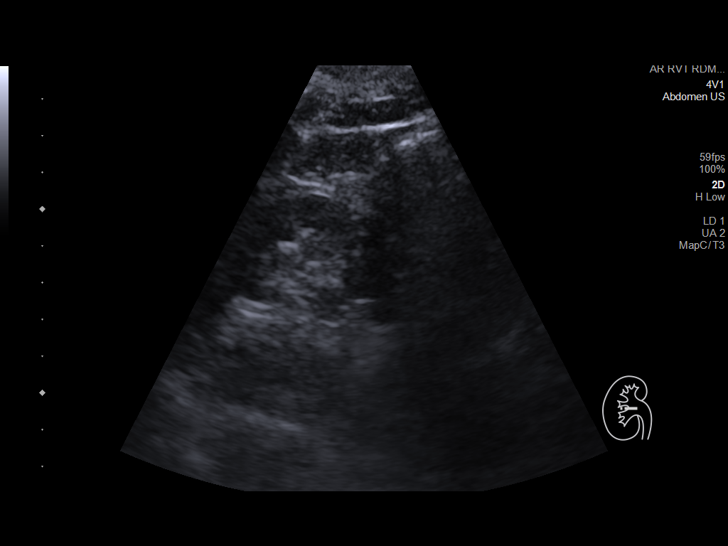
[im 69/92]
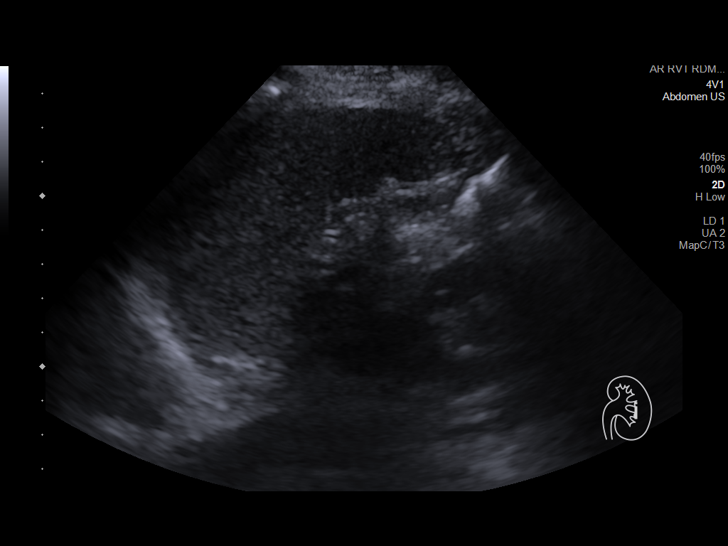
[im 76/92]
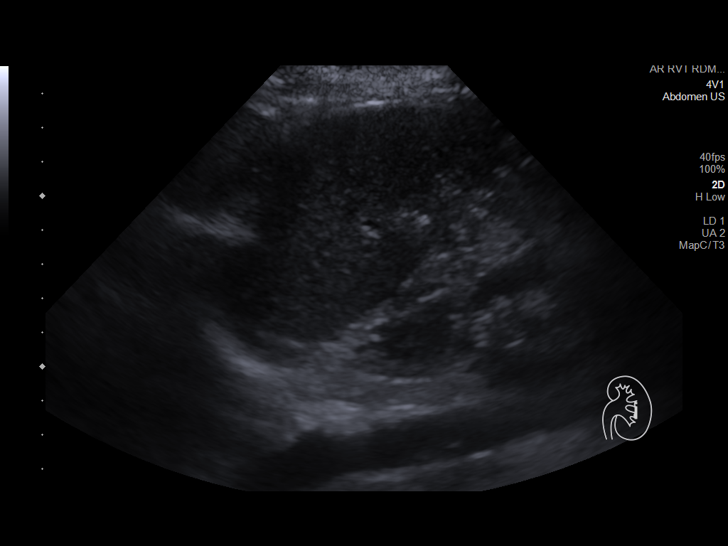
[im 84/92]
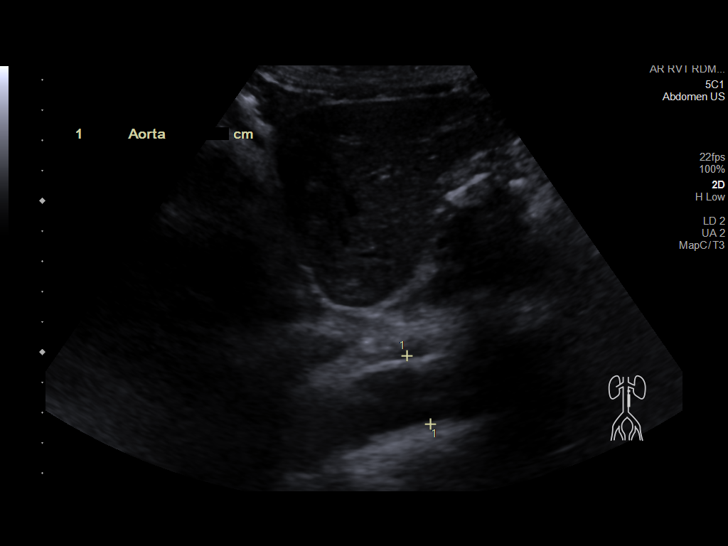
[im 92/92]
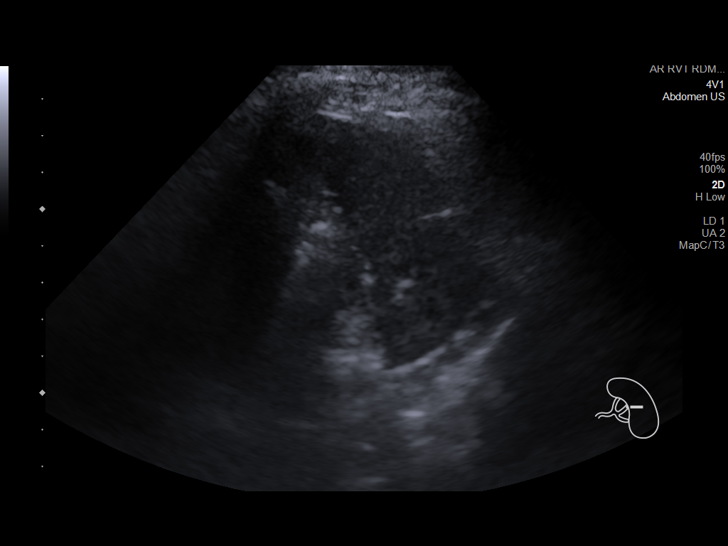

[14 of 25 positions shown; findings below may reference images not displayed]

FINDINGS: Gallbladder: Cholecystectomy.

Common bile duct: Diameter: 5 mm

Liver: No focal lesion identified. Within normal limits in
parenchymal echogenicity. Portal vein is patent on color Doppler
imaging with normal direction of blood flow towards the liver.

IVC: No abnormality visualized.

Pancreas: Suboptimally visualized due to bowel gas and body habitus.

Spleen: Size and appearance within normal limits.

Right Kidney: Length: 9.0 cm. Echogenicity within normal limits. No
mass or hydronephrosis visualized.

Left Kidney: Length: 6.6 cm. Evaluation limited due to bowel gas and
body habitus. Echogenicity within normal limits. No mass or
hydronephrosis visualized.

Abdominal aorta: No aneurysm visualized.

Other findings: None.
IMPRESSION: 1. Limited study due to body habitus and bowel gas.
2. Otherwise unremarkable exam in a patient status post
cholecystectomy.

## 2022-06-09 DIAGNOSIS — H524 Presbyopia: Secondary | ICD-10-CM | POA: Diagnosis not present

## 2022-06-11 DIAGNOSIS — M48061 Spinal stenosis, lumbar region without neurogenic claudication: Secondary | ICD-10-CM | POA: Diagnosis not present

## 2022-06-11 DIAGNOSIS — M1991 Primary osteoarthritis, unspecified site: Secondary | ICD-10-CM | POA: Diagnosis not present

## 2022-06-11 DIAGNOSIS — I10 Essential (primary) hypertension: Secondary | ICD-10-CM | POA: Diagnosis not present

## 2022-06-11 DIAGNOSIS — G894 Chronic pain syndrome: Secondary | ICD-10-CM | POA: Diagnosis not present

## 2022-06-28 DIAGNOSIS — M79644 Pain in right finger(s): Secondary | ICD-10-CM | POA: Diagnosis not present

## 2022-06-28 DIAGNOSIS — Z882 Allergy status to sulfonamides status: Secondary | ICD-10-CM | POA: Diagnosis not present

## 2022-06-28 DIAGNOSIS — M5412 Radiculopathy, cervical region: Secondary | ICD-10-CM | POA: Diagnosis not present

## 2022-06-28 DIAGNOSIS — M5417 Radiculopathy, lumbosacral region: Secondary | ICD-10-CM | POA: Diagnosis not present

## 2022-06-28 DIAGNOSIS — Z881 Allergy status to other antibiotic agents status: Secondary | ICD-10-CM | POA: Diagnosis not present

## 2022-06-28 DIAGNOSIS — I10 Essential (primary) hypertension: Secondary | ICD-10-CM | POA: Diagnosis not present

## 2022-06-28 DIAGNOSIS — Z87891 Personal history of nicotine dependence: Secondary | ICD-10-CM | POA: Diagnosis not present

## 2022-06-28 DIAGNOSIS — Z886 Allergy status to analgesic agent status: Secondary | ICD-10-CM | POA: Diagnosis not present

## 2022-06-28 DIAGNOSIS — G629 Polyneuropathy, unspecified: Secondary | ICD-10-CM | POA: Diagnosis not present

## 2022-06-28 DIAGNOSIS — Z880 Allergy status to penicillin: Secondary | ICD-10-CM | POA: Diagnosis not present

## 2022-06-28 DIAGNOSIS — Z91048 Other nonmedicinal substance allergy status: Secondary | ICD-10-CM | POA: Diagnosis not present

## 2022-06-28 DIAGNOSIS — Z8673 Personal history of transient ischemic attack (TIA), and cerebral infarction without residual deficits: Secondary | ICD-10-CM | POA: Diagnosis not present

## 2022-06-28 DIAGNOSIS — Z885 Allergy status to narcotic agent status: Secondary | ICD-10-CM | POA: Diagnosis not present

## 2022-06-28 DIAGNOSIS — E785 Hyperlipidemia, unspecified: Secondary | ICD-10-CM | POA: Diagnosis not present

## 2022-06-28 DIAGNOSIS — Z91030 Bee allergy status: Secondary | ICD-10-CM | POA: Diagnosis not present

## 2022-06-28 DIAGNOSIS — R7309 Other abnormal glucose: Secondary | ICD-10-CM | POA: Diagnosis not present

## 2022-06-29 DIAGNOSIS — M5412 Radiculopathy, cervical region: Secondary | ICD-10-CM | POA: Insufficient documentation

## 2022-06-29 DIAGNOSIS — M5417 Radiculopathy, lumbosacral region: Secondary | ICD-10-CM | POA: Insufficient documentation

## 2022-07-02 IMAGING — DX DG ANKLE COMPLETE 3+V*L*
3 series · 3 of 3 positions shown · non-contrast
Comparison: None.

CLINICAL DATA: Left ankle injury. Patient states she slipped on
stairs on [REDACTED]. Left ankle pain.

EXAM:
LEFT ANKLE COMPLETE - 3+ VIEW

[ankle ap]
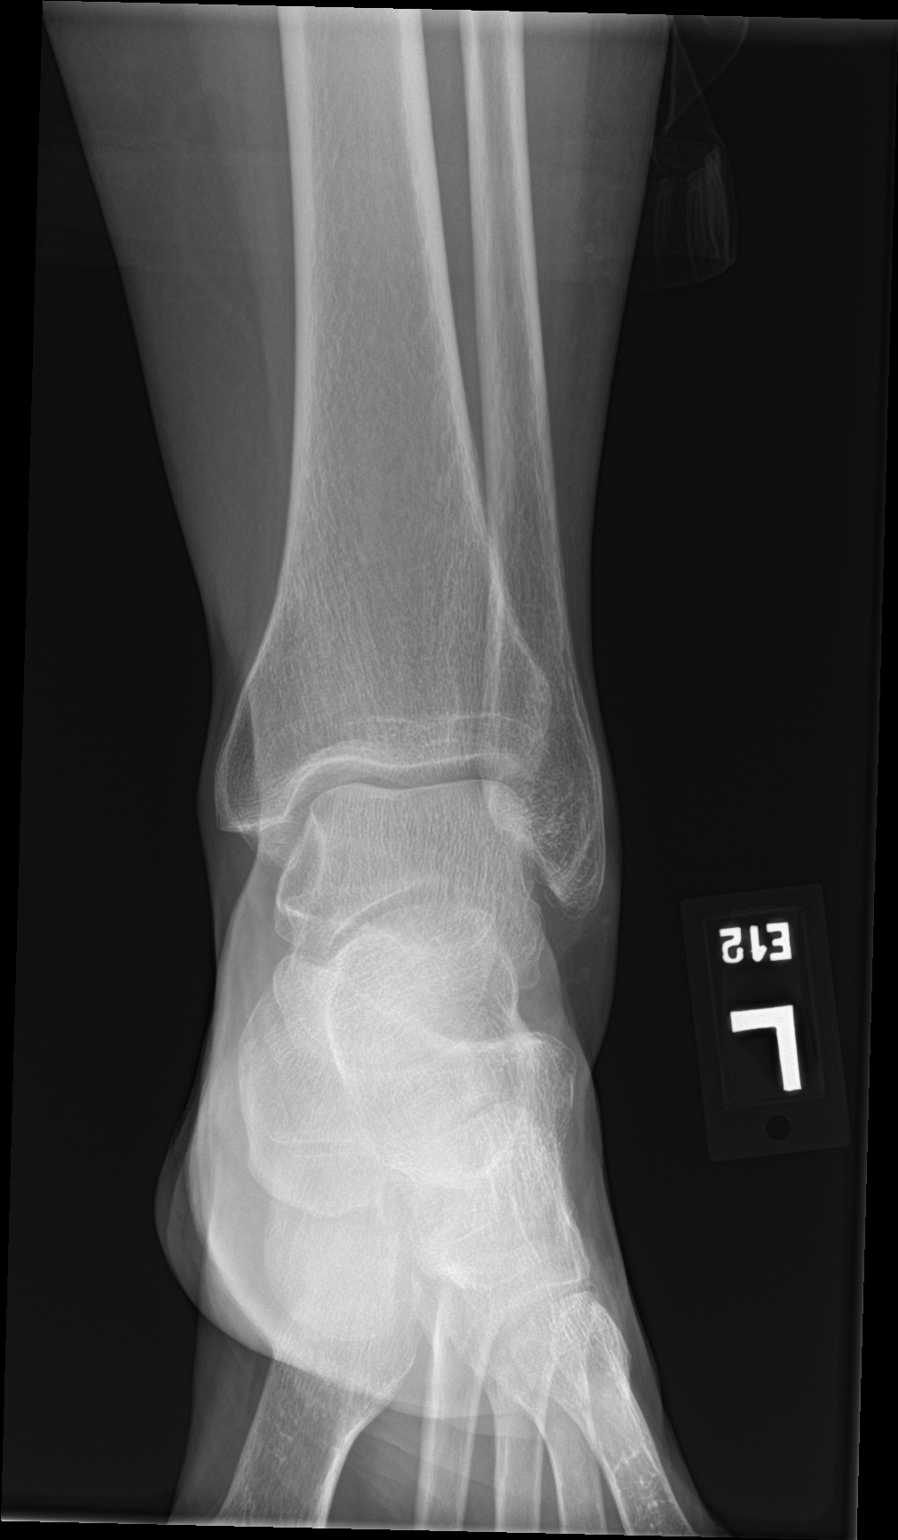

[ankle obl]
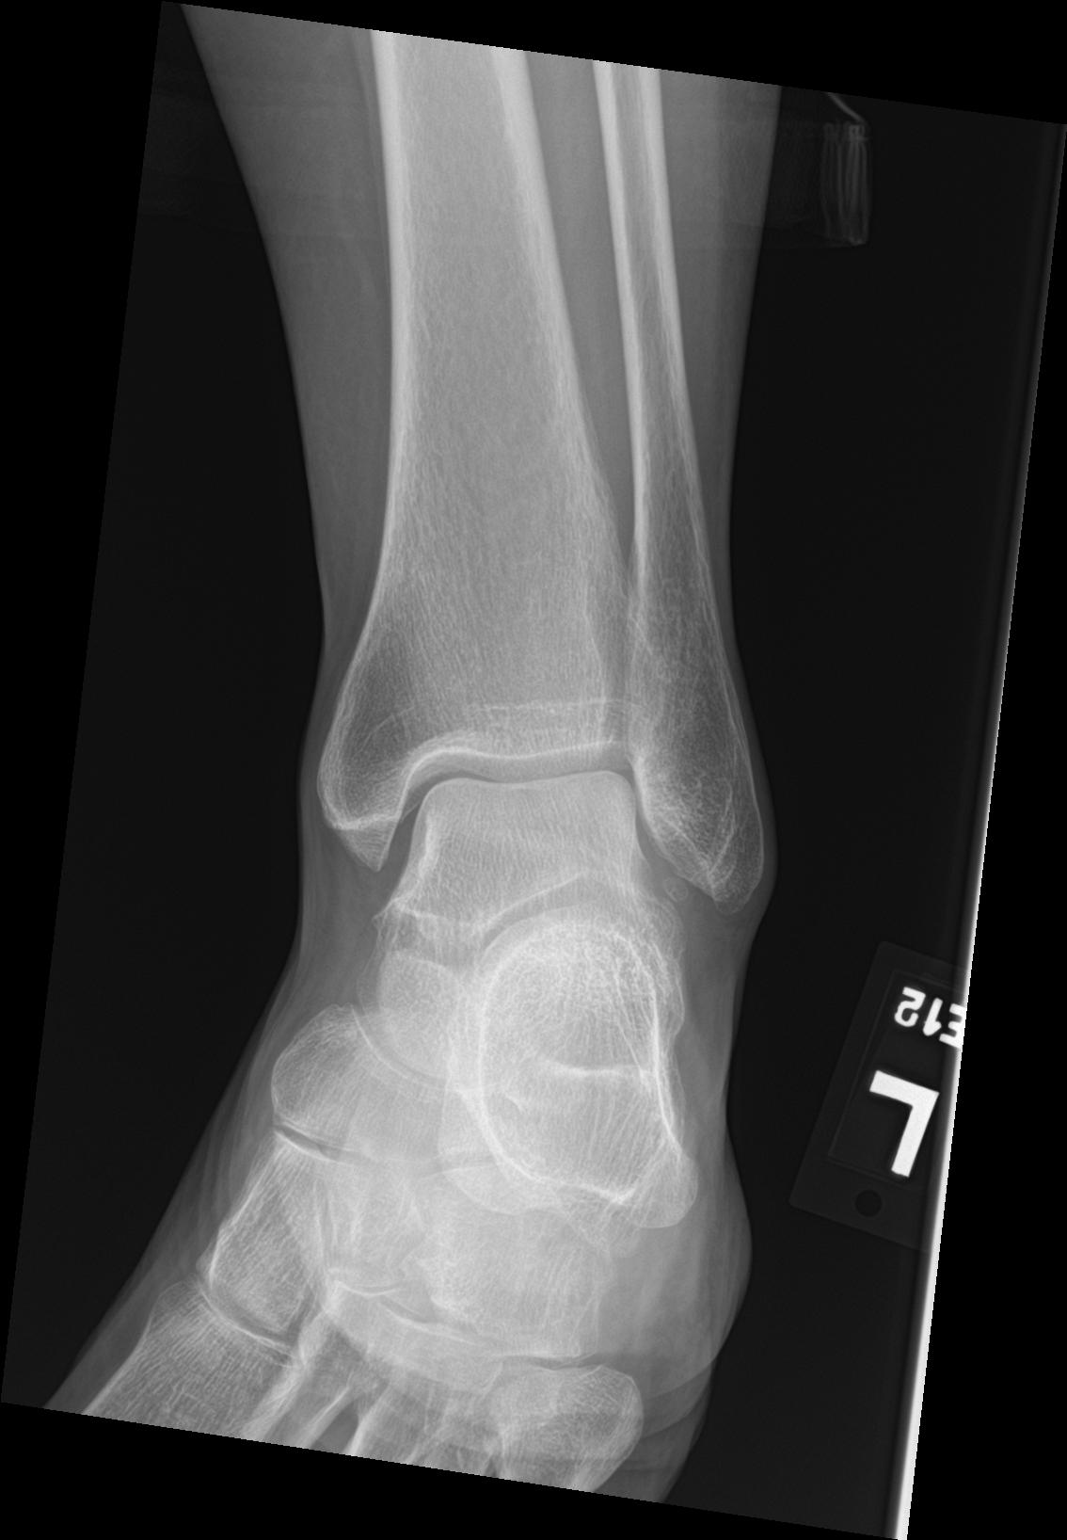

[ankle lat]
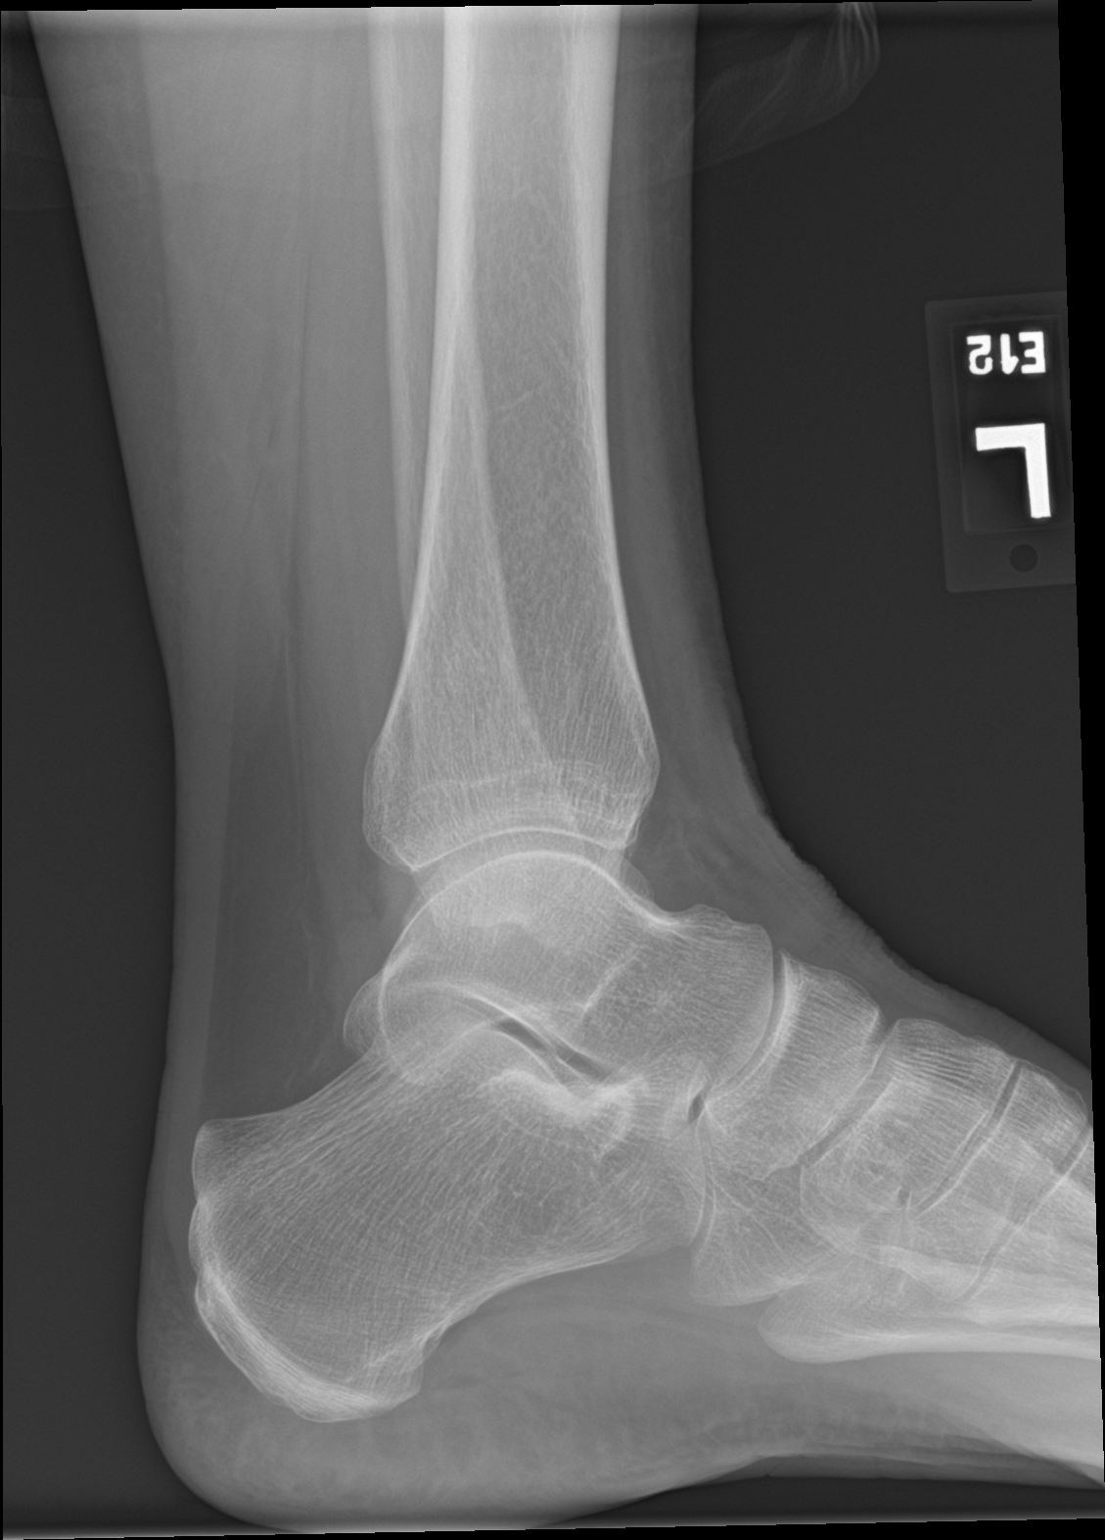

[3 of 3 positions shown; findings below may reference images not displayed]

FINDINGS: There is no evidence of fracture, dislocation, or joint effusion.
There is no evidence of arthropathy or other focal bone abnormality.
Soft tissues are unremarkable.
IMPRESSION: Negative.

## 2022-07-02 IMAGING — DX DG ELBOW 2V*L*
2 series · 2 of 2 positions shown · non-contrast
Comparison: None.

CLINICAL DATA: Left elbow injury. Patient states she slipped on
stairs. Left elbow pain.

EXAM:
LEFT ELBOW - 2 VIEW

[elbow ap]
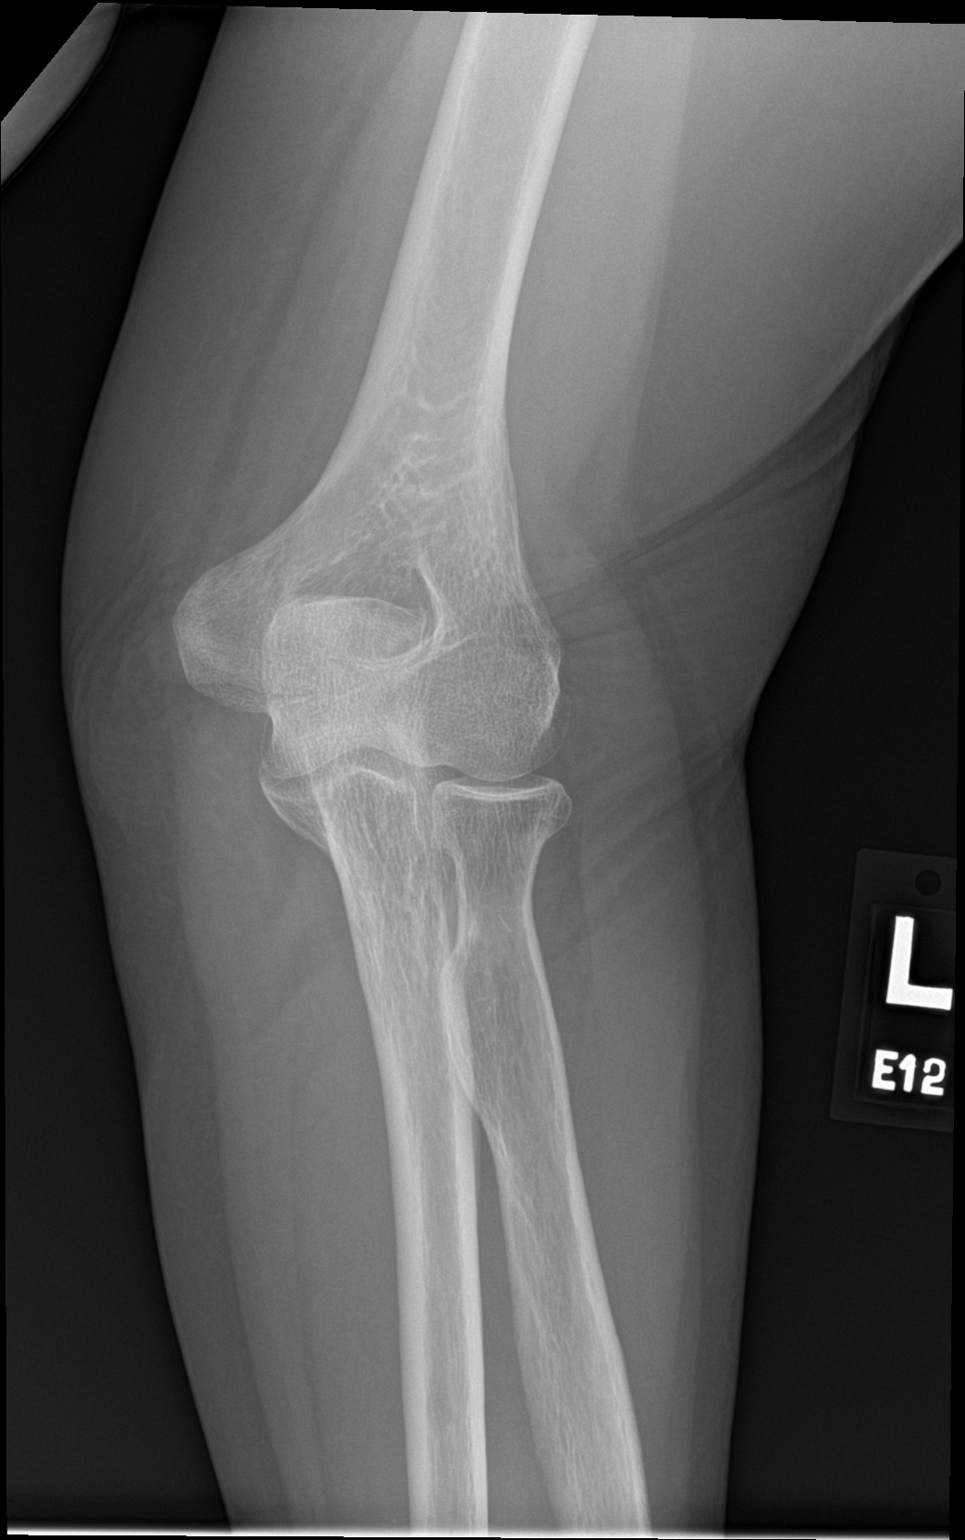

[elbow lat]
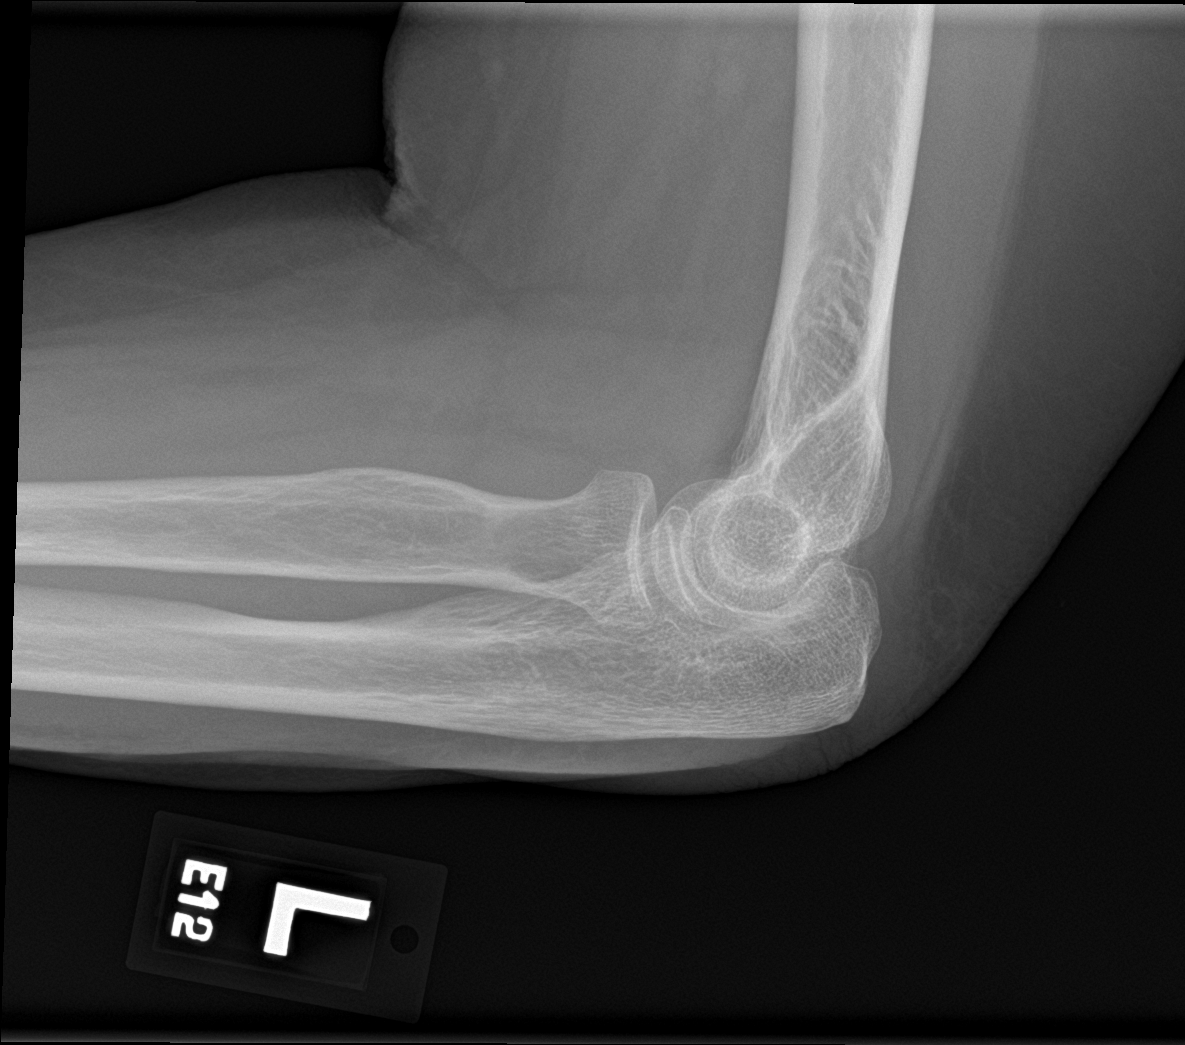

[2 of 2 positions shown; findings below may reference images not displayed]

FINDINGS: There is no evidence of fracture, dislocation, or joint effusion.
There is no evidence of arthropathy or other focal bone abnormality.
Soft tissues are unremarkable.
IMPRESSION: Negative.

## 2022-07-02 IMAGING — DX DG SACRUM/COCCYX 2+V
3 series · 3 of 3 positions shown · non-contrast
Comparison: None.

CLINICAL DATA: Injury of coccyx, patient states she slipped on
stairs on [REDACTED] and complains of pain in her buttocks. Chest

EXAM:
SACRUM AND KSKKA8-S VIEW

[coccyx ap]
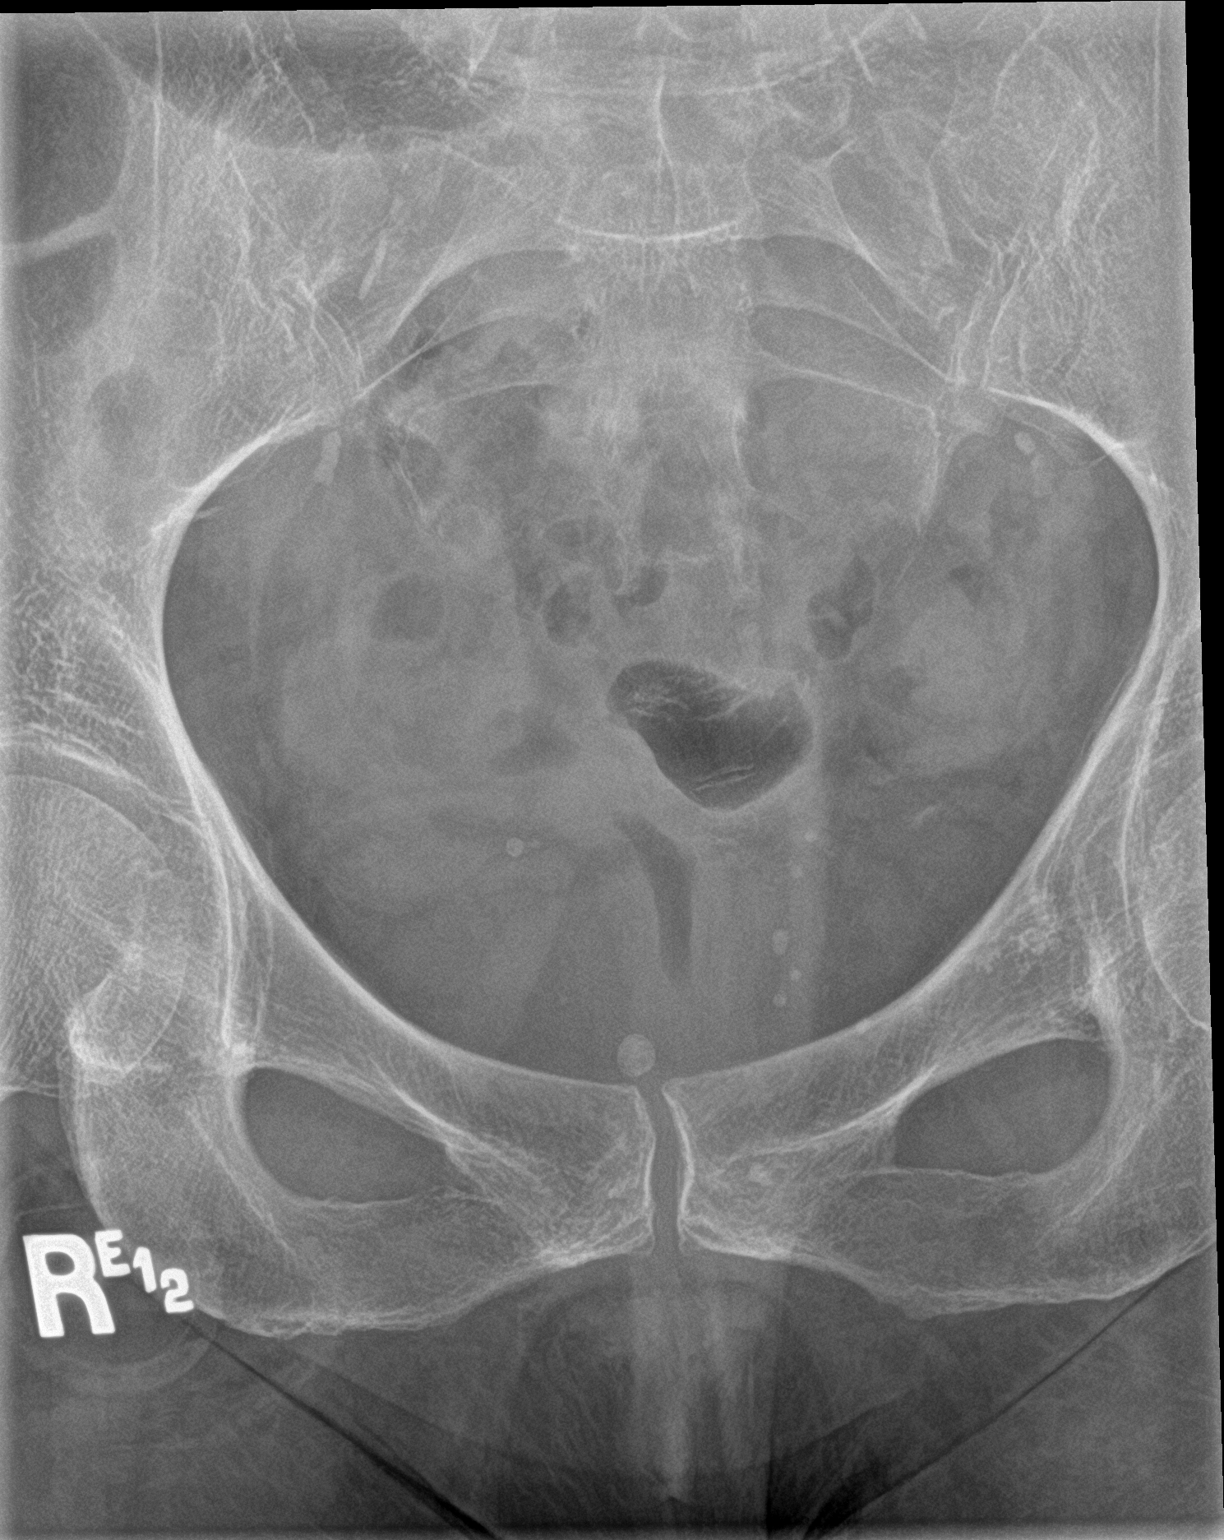

[sacrum ap]
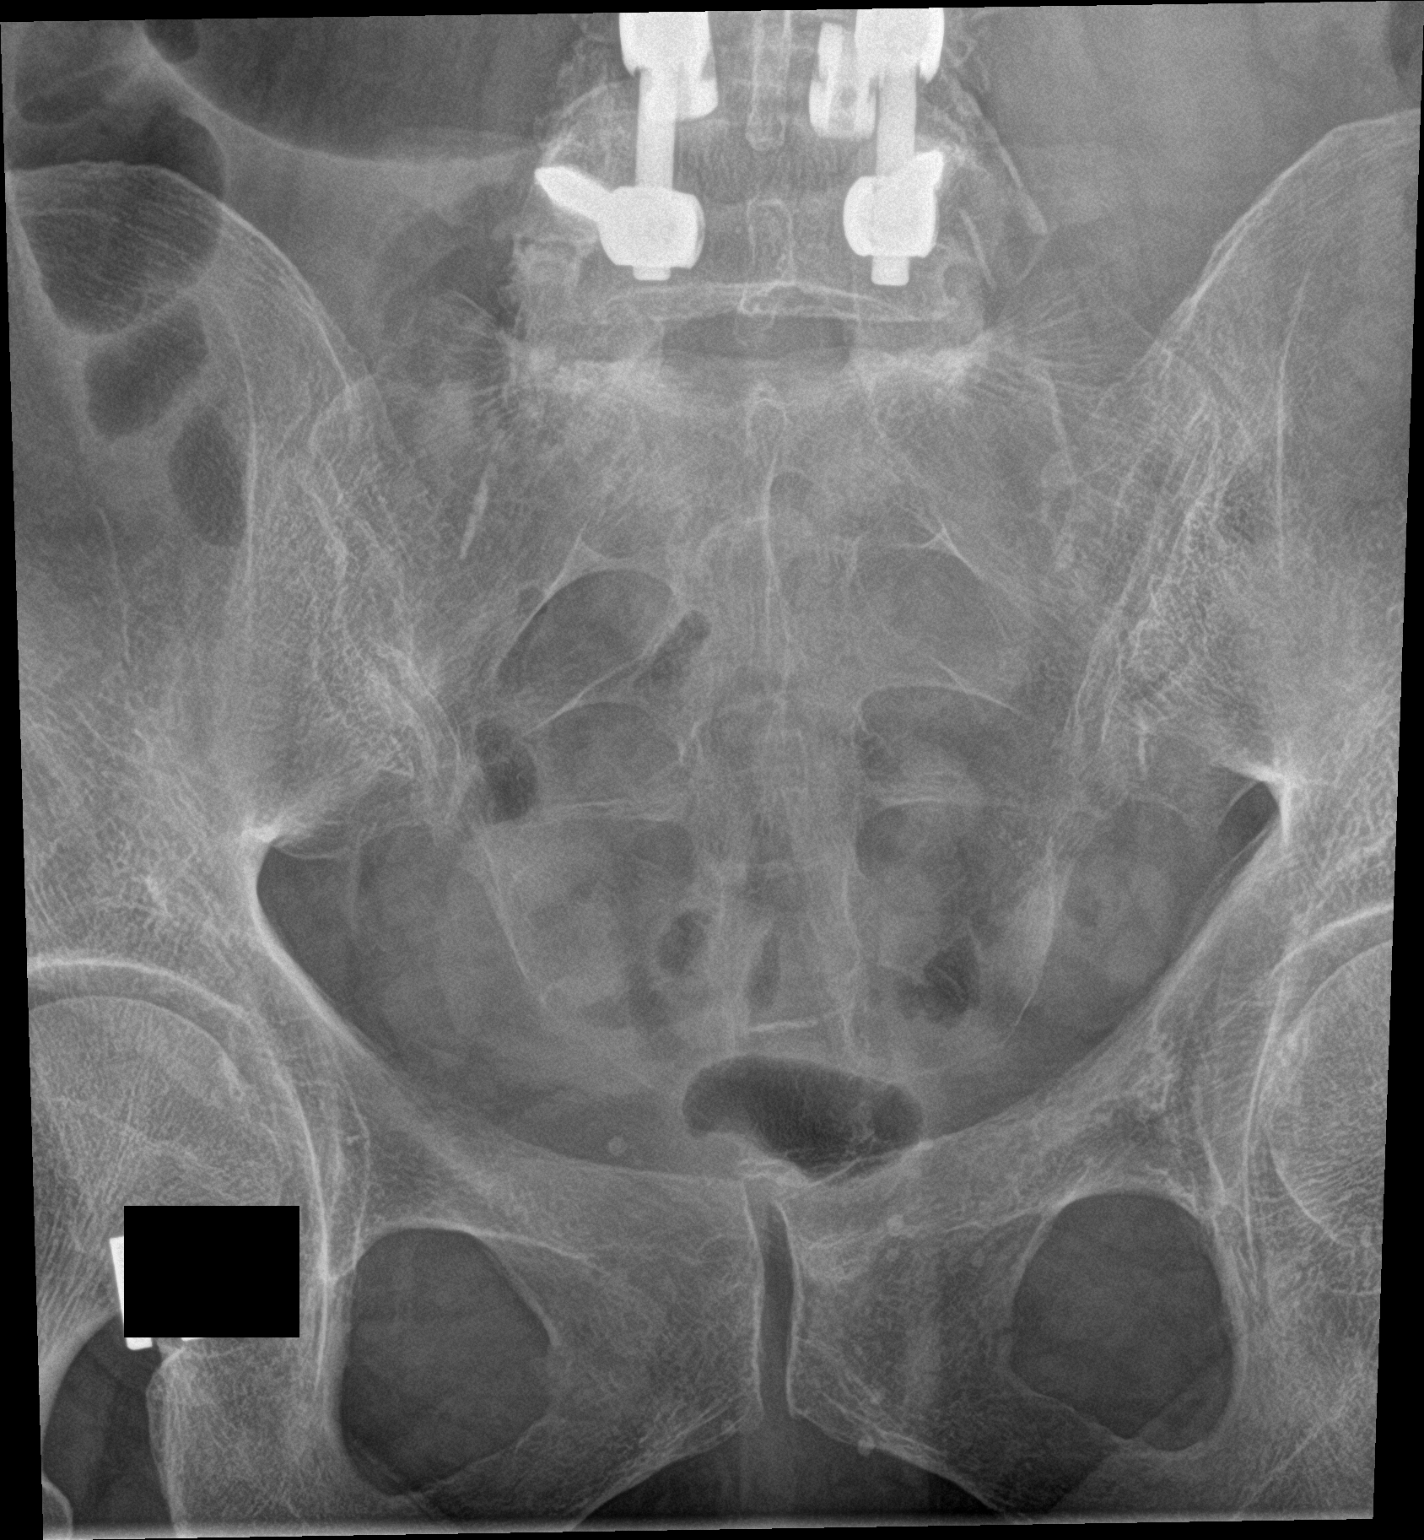

[sacrum lat]
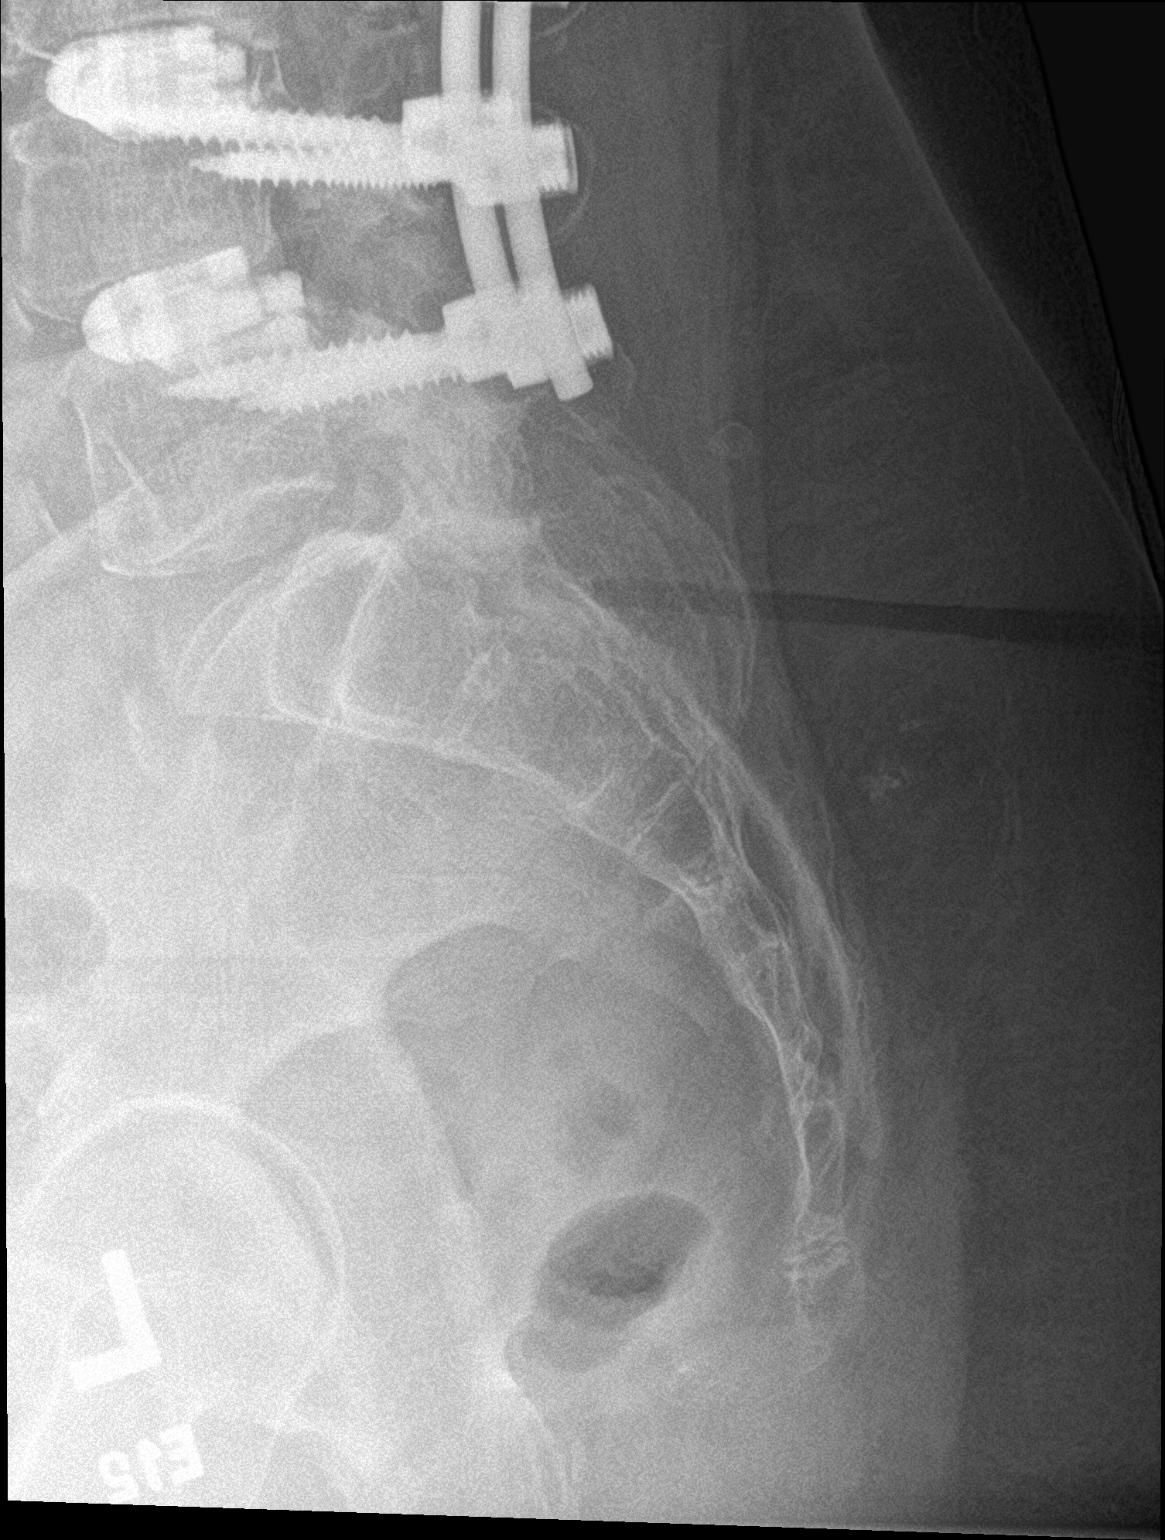

[3 of 3 positions shown; findings below may reference images not displayed]

FINDINGS: There is no evidence of fracture or other focal bone lesions.
Partially imaged posterior lumbar fusion.
IMPRESSION: No acute fracture or dislocation.

## 2022-07-09 DIAGNOSIS — M1991 Primary osteoarthritis, unspecified site: Secondary | ICD-10-CM | POA: Diagnosis not present

## 2022-07-09 DIAGNOSIS — H6012 Cellulitis of left external ear: Secondary | ICD-10-CM | POA: Diagnosis not present

## 2022-07-09 DIAGNOSIS — I10 Essential (primary) hypertension: Secondary | ICD-10-CM | POA: Diagnosis not present

## 2022-07-09 DIAGNOSIS — M48061 Spinal stenosis, lumbar region without neurogenic claudication: Secondary | ICD-10-CM | POA: Diagnosis not present

## 2022-07-22 ENCOUNTER — Ambulatory Visit (HOSPITAL_COMMUNITY)
Admission: RE | Admit: 2022-07-22 | Discharge: 2022-07-22 | Disposition: A | Discharge status: Home / Self Care | Payer: Medicare HMO | Source: Ambulatory Visit | Attending: Internal Medicine | Admitting: Internal Medicine

## 2022-07-22 ENCOUNTER — Other Ambulatory Visit (HOSPITAL_COMMUNITY): Payer: Self-pay | Admitting: Internal Medicine

## 2022-07-22 DIAGNOSIS — S99922A Unspecified injury of left foot, initial encounter: Secondary | ICD-10-CM | POA: Diagnosis not present

## 2022-07-22 DIAGNOSIS — S90922A Unspecified superficial injury of left foot, initial encounter: Secondary | ICD-10-CM | POA: Insufficient documentation

## 2022-07-25 DIAGNOSIS — S92335A Nondisplaced fracture of third metatarsal bone, left foot, initial encounter for closed fracture: Secondary | ICD-10-CM | POA: Diagnosis not present

## 2022-07-25 DIAGNOSIS — M2012 Hallux valgus (acquired), left foot: Secondary | ICD-10-CM | POA: Diagnosis not present

## 2022-07-25 DIAGNOSIS — M79672 Pain in left foot: Secondary | ICD-10-CM | POA: Diagnosis not present

## 2022-07-25 DIAGNOSIS — M7989 Other specified soft tissue disorders: Secondary | ICD-10-CM | POA: Diagnosis not present

## 2022-07-25 DIAGNOSIS — Z79899 Other long term (current) drug therapy: Secondary | ICD-10-CM | POA: Diagnosis not present

## 2022-07-25 DIAGNOSIS — Z87891 Personal history of nicotine dependence: Secondary | ICD-10-CM | POA: Diagnosis not present

## 2022-07-25 DIAGNOSIS — E785 Hyperlipidemia, unspecified: Secondary | ICD-10-CM | POA: Diagnosis not present

## 2022-07-25 DIAGNOSIS — W2209XA Striking against other stationary object, initial encounter: Secondary | ICD-10-CM | POA: Diagnosis not present

## 2022-07-25 DIAGNOSIS — Z8673 Personal history of transient ischemic attack (TIA), and cerebral infarction without residual deficits: Secondary | ICD-10-CM | POA: Diagnosis not present

## 2022-07-25 DIAGNOSIS — G629 Polyneuropathy, unspecified: Secondary | ICD-10-CM | POA: Diagnosis not present

## 2022-07-25 DIAGNOSIS — I10 Essential (primary) hypertension: Secondary | ICD-10-CM | POA: Diagnosis not present

## 2022-07-25 DIAGNOSIS — Z7983 Long term (current) use of bisphosphonates: Secondary | ICD-10-CM | POA: Diagnosis not present

## 2022-07-25 DIAGNOSIS — Z7982 Long term (current) use of aspirin: Secondary | ICD-10-CM | POA: Diagnosis not present

## 2022-07-25 DIAGNOSIS — E271 Primary adrenocortical insufficiency: Secondary | ICD-10-CM | POA: Diagnosis not present

## 2022-08-03 DIAGNOSIS — M48061 Spinal stenosis, lumbar region without neurogenic claudication: Secondary | ICD-10-CM | POA: Diagnosis not present

## 2022-08-03 DIAGNOSIS — I10 Essential (primary) hypertension: Secondary | ICD-10-CM | POA: Diagnosis not present

## 2022-08-03 DIAGNOSIS — Z6829 Body mass index (BMI) 29.0-29.9, adult: Secondary | ICD-10-CM | POA: Diagnosis not present

## 2022-08-03 DIAGNOSIS — R69 Illness, unspecified: Secondary | ICD-10-CM | POA: Diagnosis not present

## 2022-08-03 DIAGNOSIS — E271 Primary adrenocortical insufficiency: Secondary | ICD-10-CM | POA: Diagnosis not present

## 2022-08-03 DIAGNOSIS — M4316 Spondylolisthesis, lumbar region: Secondary | ICD-10-CM | POA: Diagnosis not present

## 2022-08-03 DIAGNOSIS — G894 Chronic pain syndrome: Secondary | ICD-10-CM | POA: Diagnosis not present

## 2022-08-03 DIAGNOSIS — M1991 Primary osteoarthritis, unspecified site: Secondary | ICD-10-CM | POA: Diagnosis not present

## 2022-08-16 DIAGNOSIS — Z20822 Contact with and (suspected) exposure to covid-19: Secondary | ICD-10-CM | POA: Diagnosis not present

## 2022-08-16 DIAGNOSIS — K529 Noninfective gastroenteritis and colitis, unspecified: Secondary | ICD-10-CM | POA: Diagnosis not present

## 2022-08-18 IMAGING — DX DG LUMBAR SPINE COMPLETE 4+V
5 series · 5 of 5 positions shown · non-contrast
Comparison: X-ray 06/05/2021; MRI 01/28/2021.

CLINICAL DATA: Low back pain.

EXAM:
LUMBAR SPINE - COMPLETE 4+ VIEW

[l-spine ap]
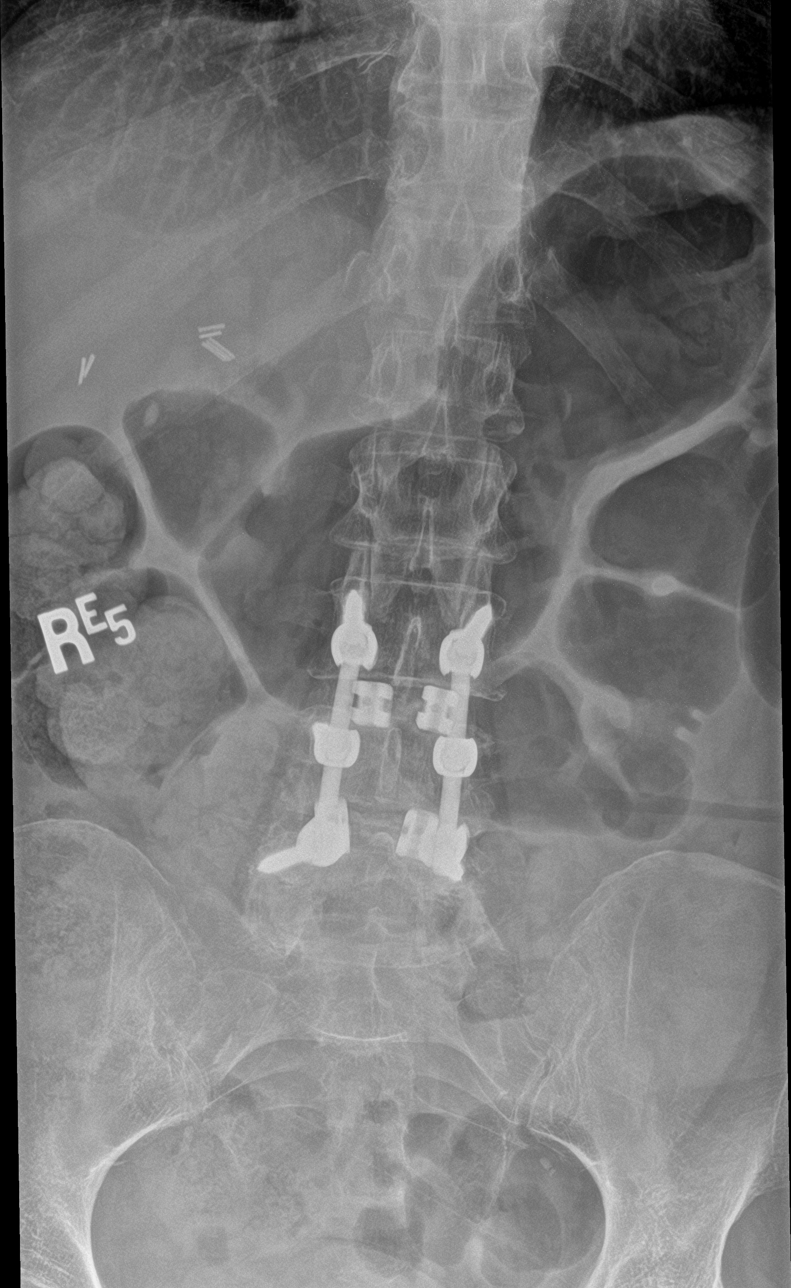

[l-spine obl (1 of 2)]
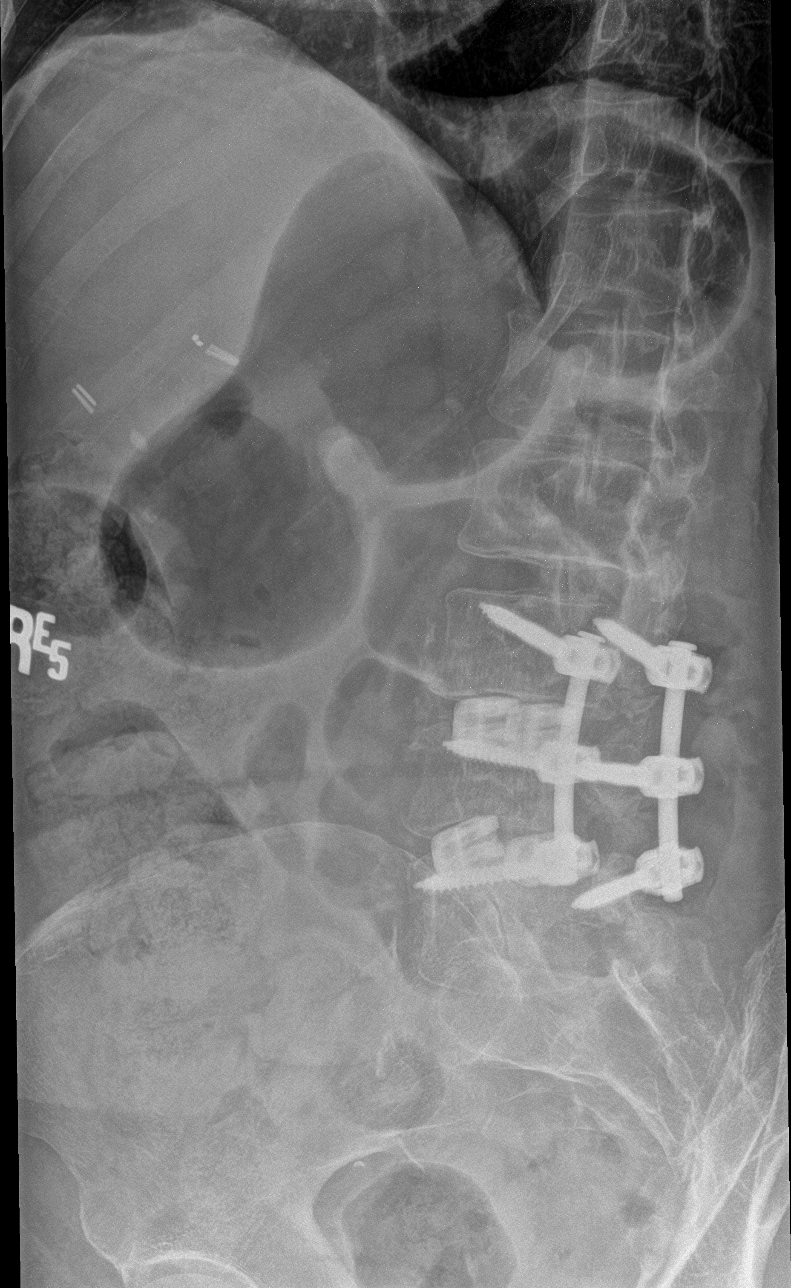

[l-spine obl (2 of 2)]
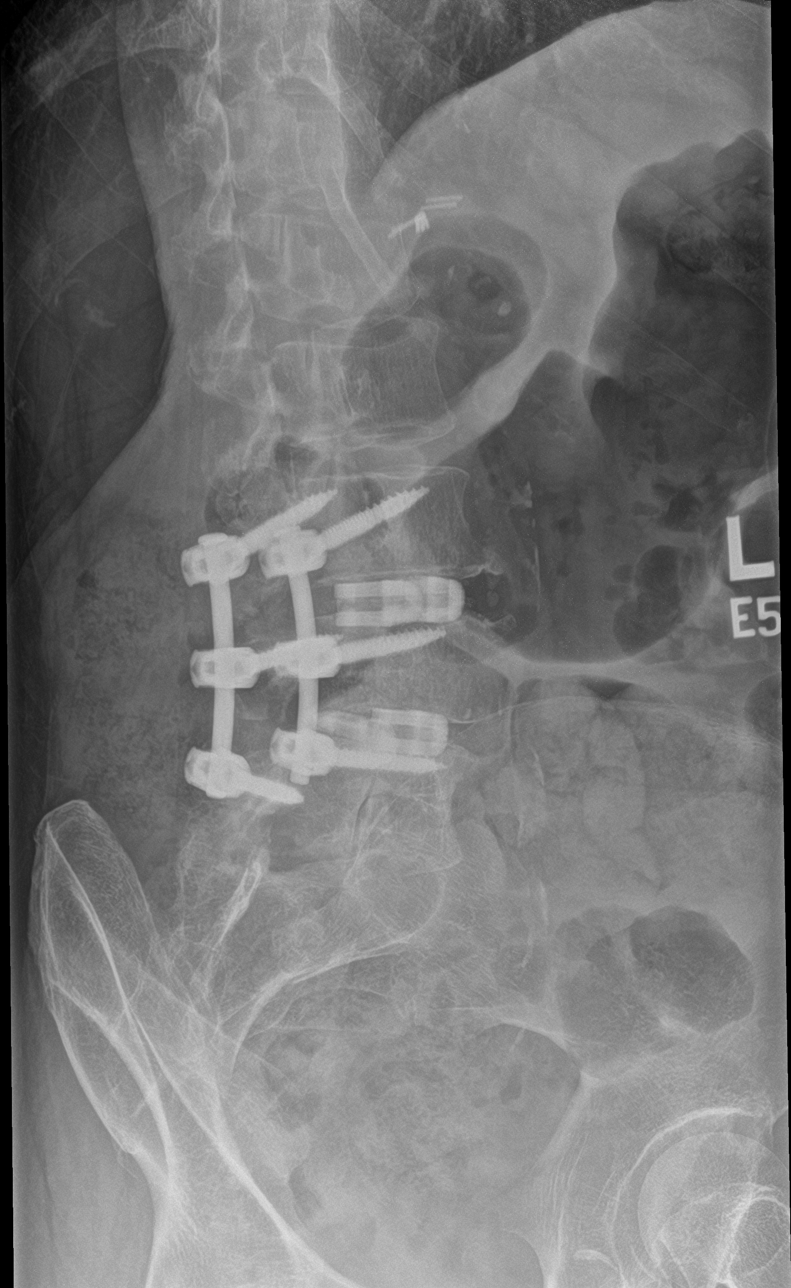

[l-spine lat]
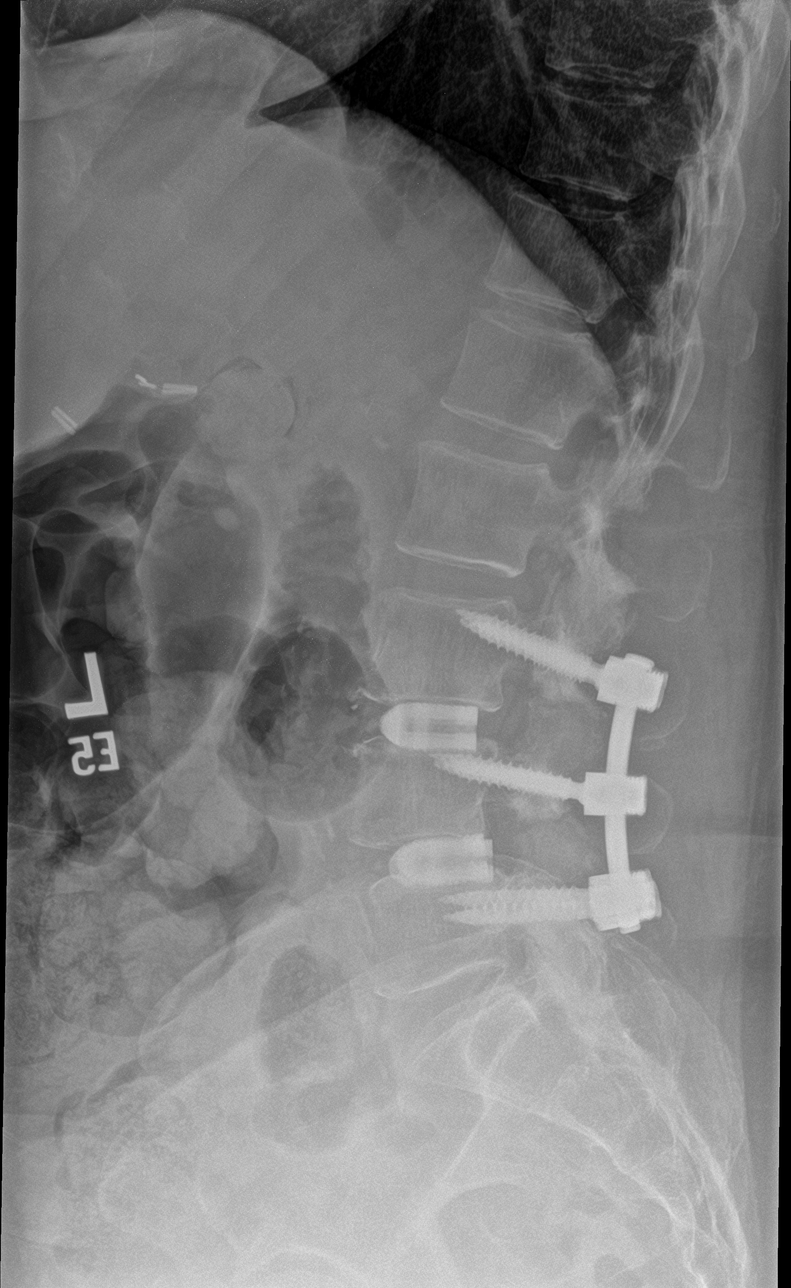

[l-spine spot]
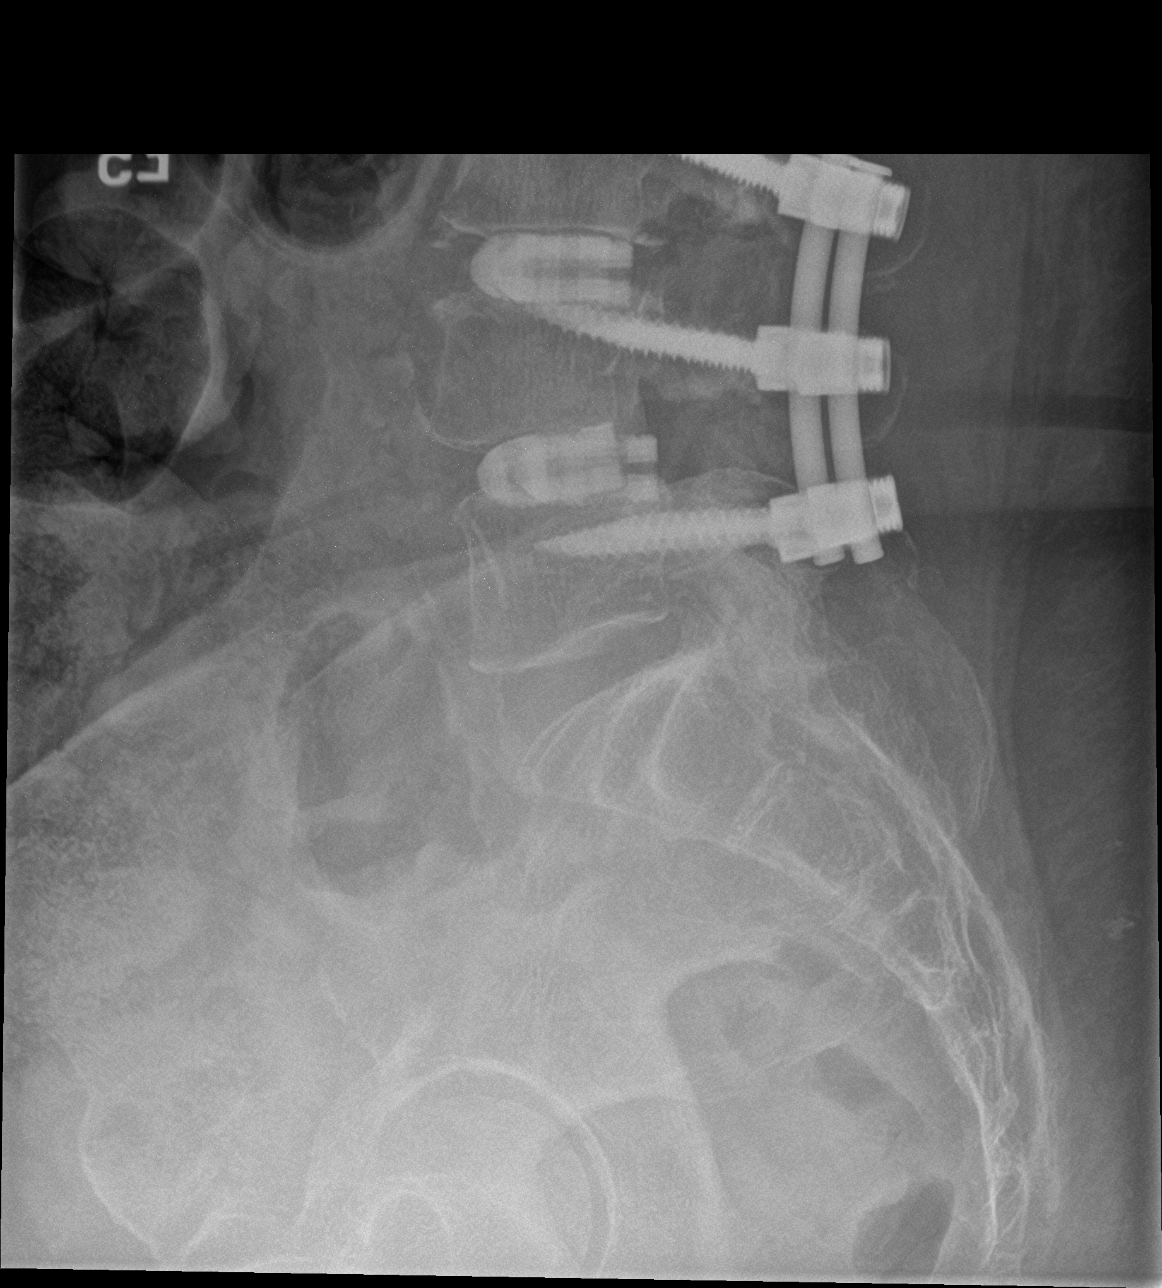

[5 of 5 positions shown; findings below may reference images not displayed]

FINDINGS: Lumbar spine numbered the lowest segmented appearing lumbar shaped
vertebrae on lateral view as L5. Prior L3 through L5 posterior and
interbody fusion again noted. Hardware intact. Anatomic alignment.
Diffuse osteopenia mild degenerative change. No acute bony
abnormality. Aortoiliac atherosclerotic vascular calcification.
IMPRESSION: 1. Prior L3 through L5 posterior interbody fusion again noted.
Hardware intact. Anatomic alignment. Diffuse osteopenia and mild
degenerative change. No acute bony abnormality.

2.  Aortoiliac atherosclerotic vascular disease.

## 2022-09-02 DIAGNOSIS — J0100 Acute maxillary sinusitis, unspecified: Secondary | ICD-10-CM | POA: Diagnosis not present

## 2022-09-02 DIAGNOSIS — E271 Primary adrenocortical insufficiency: Secondary | ICD-10-CM | POA: Diagnosis not present

## 2022-09-02 DIAGNOSIS — G894 Chronic pain syndrome: Secondary | ICD-10-CM | POA: Diagnosis not present

## 2022-09-02 DIAGNOSIS — R69 Illness, unspecified: Secondary | ICD-10-CM | POA: Diagnosis not present

## 2022-09-11 IMAGING — CT CT ABD-PELV W/O CM
2 of 4 series · 15 of 46 positions shown, 17 images · non-contrast
Comparison: 01/25/2021

CLINICAL DATA: Chronic right lower quadrant abdominal pain



[Series 2: axial st · axial · 0.90mm/px · z∈[+942,+1382]mm · 12 of 97 slices shown, 14 images]
[im 5/97  soft-tissue]
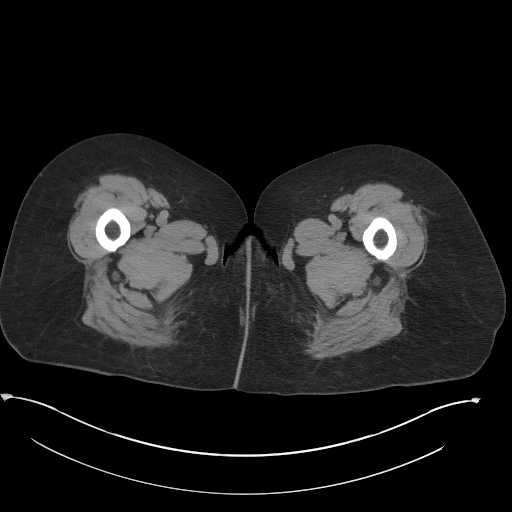
[im 5/97  bone]
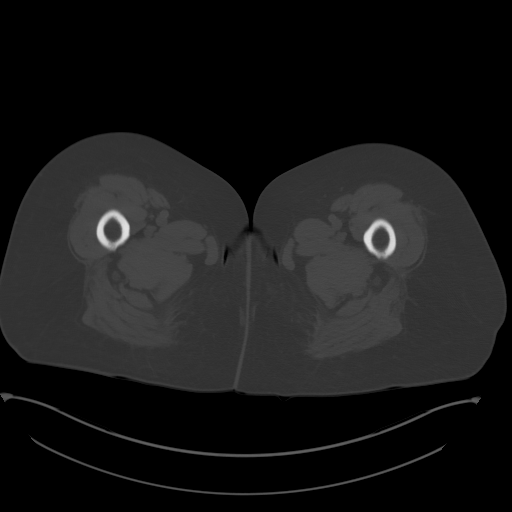
[im 13/97  soft-tissue]
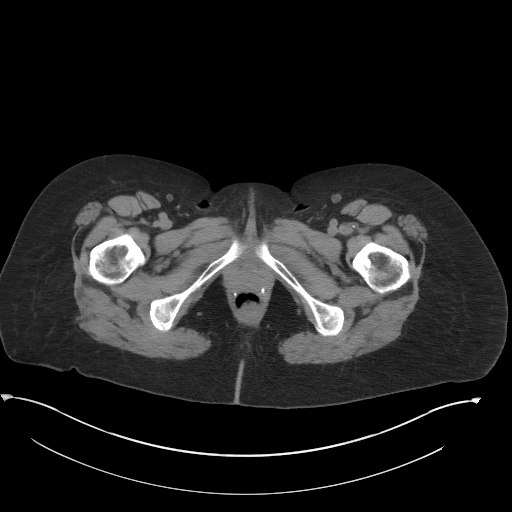
[im 21/97  soft-tissue]
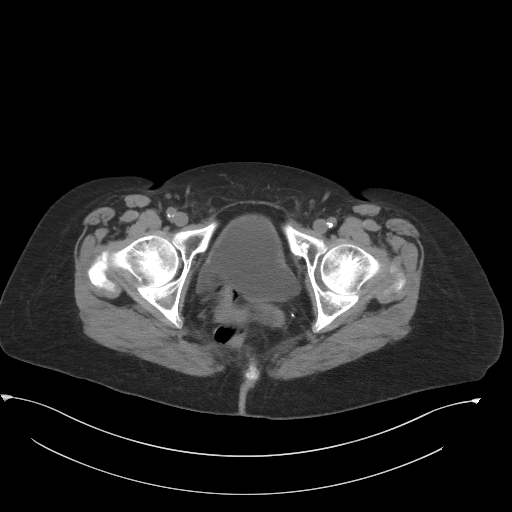
[im 29/97  soft-tissue]
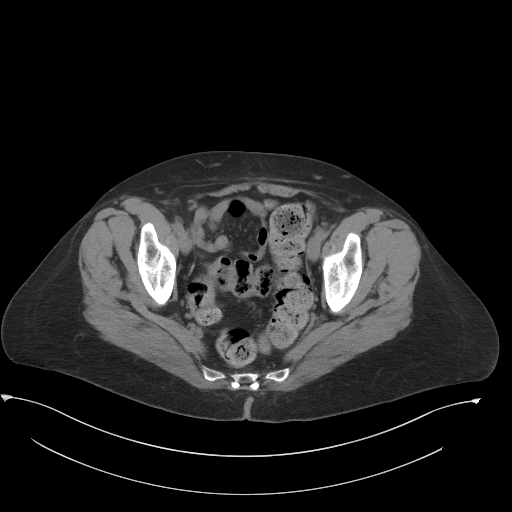
[im 37/97  soft-tissue]
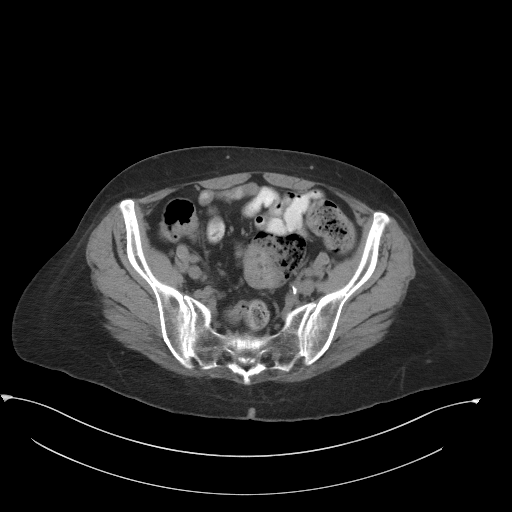
[im 45/97  soft-tissue]
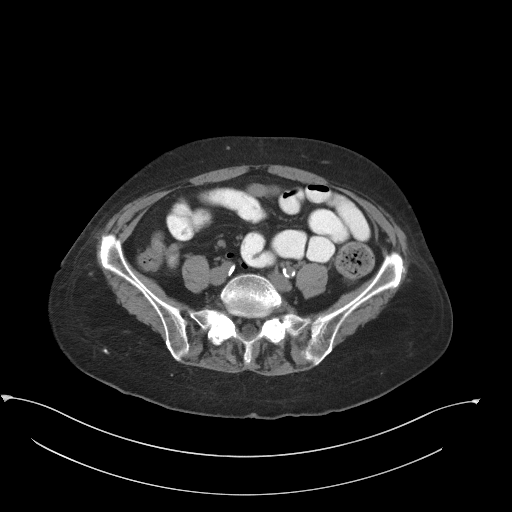
[im 53/97  soft-tissue]
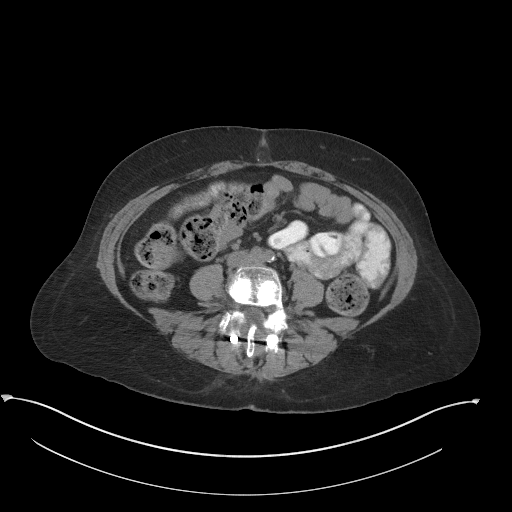
[im 61/97  soft-tissue]
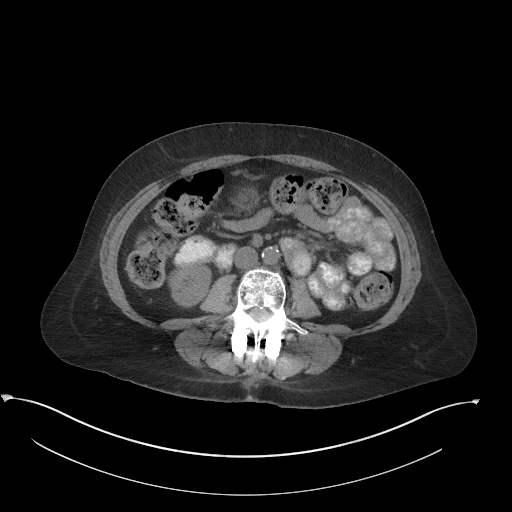
[im 69/97  soft-tissue]
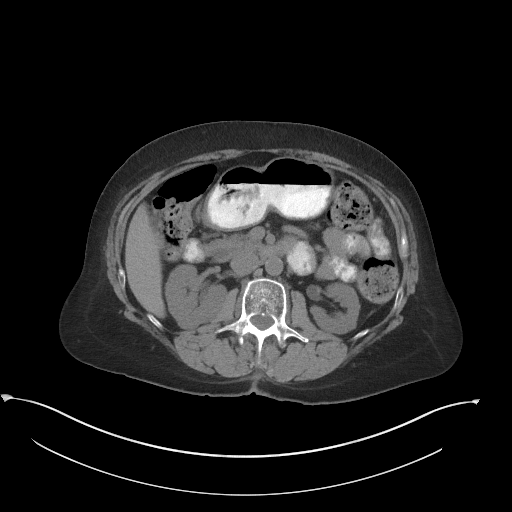
[im 69/97  bone]
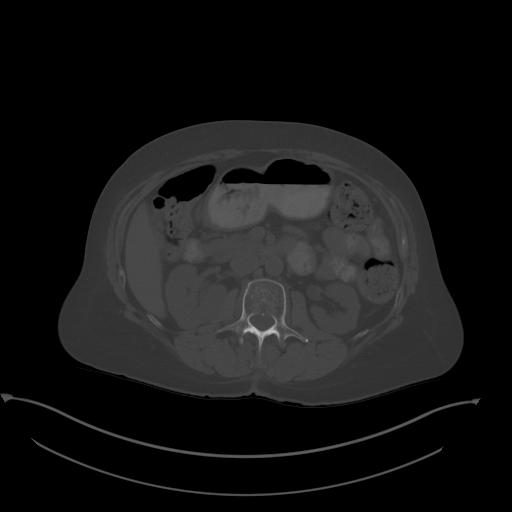
[im 77/97  soft-tissue]
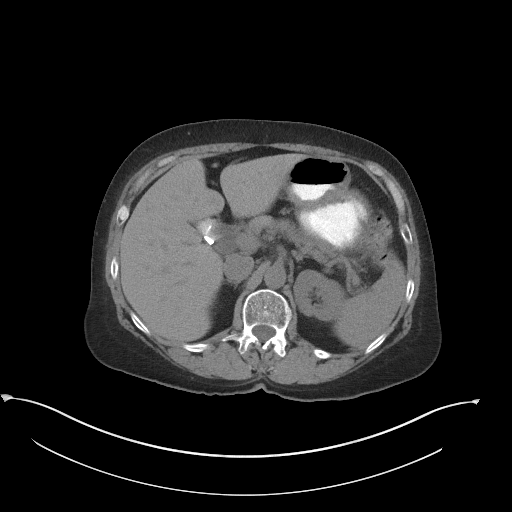
[im 85/97  soft-tissue]
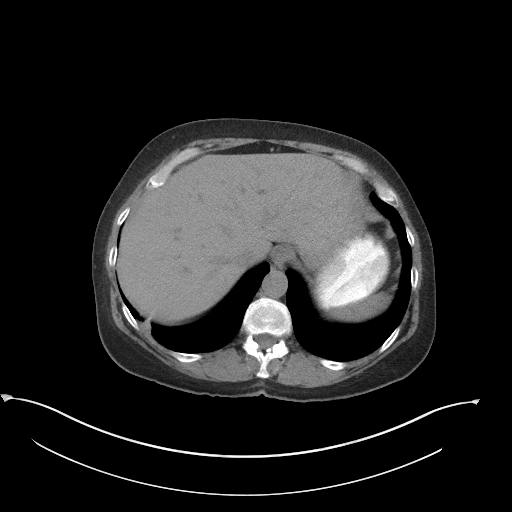
[im 93/97  soft-tissue]
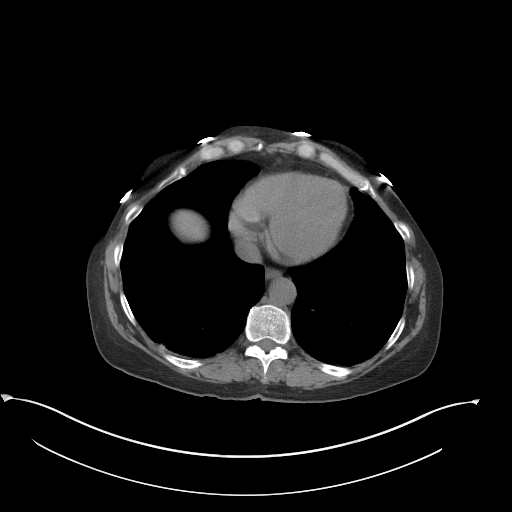

[Series 5: coronal st · coronal · 0.79mm/px · 3 of 96 slices shown]
[im 32/96  soft-tissue]
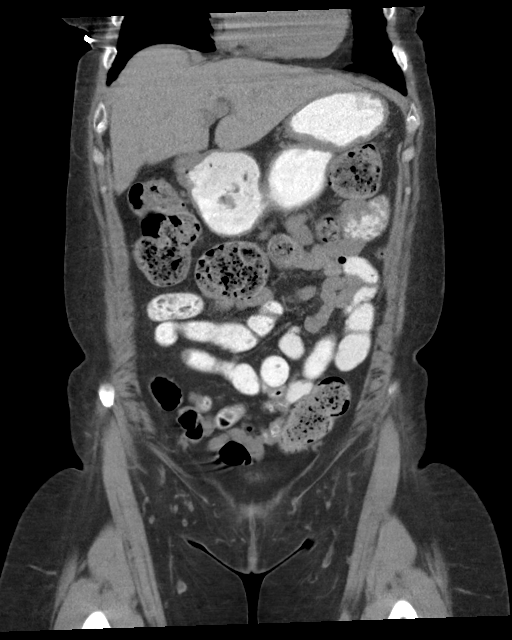
[im 43/96  soft-tissue]
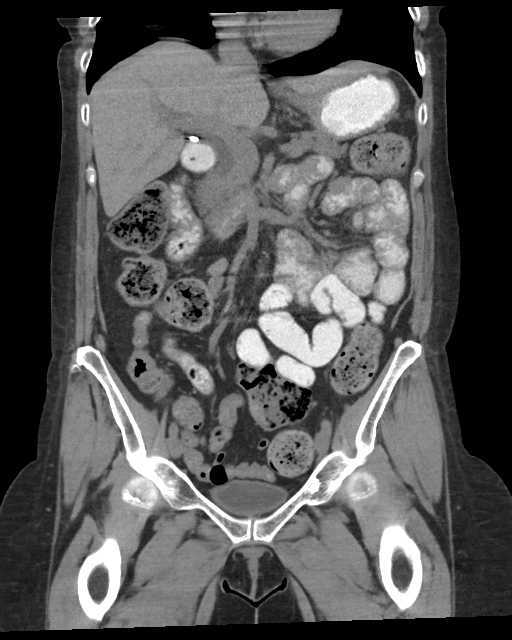
[im 53/96  soft-tissue]
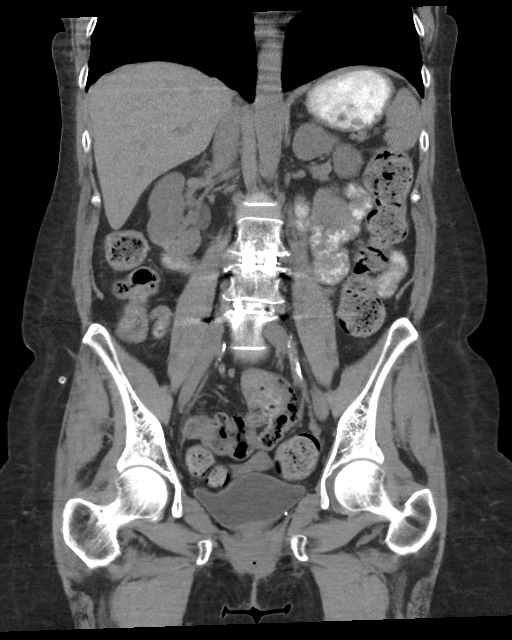

[15 of 46 positions shown; findings below may reference images not displayed]

FINDINGS: Lower chest: No acute abnormality.

Hepatobiliary: Cholecystectomy has been performed. Mild intrahepatic
and moderate extrahepatic biliary ductal dilation is present. This
has progressed since prior examination and, while this may represent
progressive post cholecystectomy change, an underlying obstructing
process such as an ampullary mass or ampullary stenosis is not
excluded. Additionally there is prominent soft tissue seen in the
region of the ampulla within the duodenal lumen, best appreciated on
image # 34/2. No definite intrahepatic mass identified on this
noncontrast examination.

Pancreas: Unremarkable

Spleen: Unremarkable

Adrenals/Urinary Tract: The adrenal glands are unremarkable. The
kidneys are normal in size and position. Mild bilateral renal
cortical scarring. No hydronephrosis. No intrarenal or ureteral
calculi. The bladder is unremarkable.

Stomach/Bowel: Moderate stool throughout the colon without evidence
of obstruction. The stomach, small bowel, and large bowel are
otherwise unremarkable. Appendix normal. No free intraperitoneal gas
or fluid.

Vascular/Lymphatic: Aortic atherosclerosis. No enlarged abdominal or
pelvic lymph nodes.

Reproductive: Status post hysterectomy. No adnexal masses.

Other: Tiny bilateral fat containing inguinal hernias are present.
Rectum unremarkable.

Musculoskeletal: L3-L5 lumbar fusion with instrumentation and
posterior decompression has been performed. No acute bone
abnormality.
IMPRESSION: No acute intra-abdominal pathology identified. No definite
radiographic explanation for the patient's reported symptoms.

Progressive intra and extrahepatic biliary ductal dilation. While
this may represent post cholecystectomy change, this appears
progressive since prior examination of 01/25/2021. Moreover, there
is prominent soft tissue seen at the ampulla and a distal
obstructing lesion such as ampullary stenosis or ampullary mass is
not excluded. Correlation with liver enzymes is recommended to
exclude an obstructive process. MRI/MRCP or ERCP examination may be
more helpful for further evaluation.

Moderate stool burden without evidence of obstruction.

Aortic Atherosclerosis ([id number].D).  None

## 2022-09-15 ENCOUNTER — Other Ambulatory Visit (HOSPITAL_COMMUNITY): Payer: Self-pay | Admitting: Physician Assistant

## 2022-09-15 DIAGNOSIS — R935 Abnormal findings on diagnostic imaging of other abdominal regions, including retroperitoneum: Secondary | ICD-10-CM | POA: Diagnosis not present

## 2022-09-15 DIAGNOSIS — K2960 Other gastritis without bleeding: Secondary | ICD-10-CM | POA: Diagnosis not present

## 2022-09-15 DIAGNOSIS — R1012 Left upper quadrant pain: Secondary | ICD-10-CM | POA: Diagnosis not present

## 2022-09-15 DIAGNOSIS — K3184 Gastroparesis: Secondary | ICD-10-CM | POA: Diagnosis not present

## 2022-09-15 DIAGNOSIS — R1011 Right upper quadrant pain: Secondary | ICD-10-CM | POA: Diagnosis not present

## 2022-09-15 DIAGNOSIS — K5900 Constipation, unspecified: Secondary | ICD-10-CM | POA: Diagnosis not present

## 2022-09-16 ENCOUNTER — Other Ambulatory Visit: Payer: Self-pay | Admitting: Physician Assistant

## 2022-09-16 DIAGNOSIS — R935 Abnormal findings on diagnostic imaging of other abdominal regions, including retroperitoneum: Secondary | ICD-10-CM

## 2022-09-16 DIAGNOSIS — R1011 Right upper quadrant pain: Secondary | ICD-10-CM

## 2022-09-16 DIAGNOSIS — R1012 Left upper quadrant pain: Secondary | ICD-10-CM

## 2022-09-22 DIAGNOSIS — I10 Essential (primary) hypertension: Secondary | ICD-10-CM | POA: Diagnosis not present

## 2022-09-22 DIAGNOSIS — G35 Multiple sclerosis: Secondary | ICD-10-CM | POA: Diagnosis not present

## 2022-09-22 DIAGNOSIS — M810 Age-related osteoporosis without current pathological fracture: Secondary | ICD-10-CM | POA: Diagnosis not present

## 2022-09-22 DIAGNOSIS — E039 Hypothyroidism, unspecified: Secondary | ICD-10-CM | POA: Diagnosis not present

## 2022-09-22 DIAGNOSIS — R630 Anorexia: Secondary | ICD-10-CM | POA: Diagnosis not present

## 2022-09-22 DIAGNOSIS — Z7952 Long term (current) use of systemic steroids: Secondary | ICD-10-CM | POA: Diagnosis not present

## 2022-09-22 DIAGNOSIS — E273 Drug-induced adrenocortical insufficiency: Secondary | ICD-10-CM | POA: Diagnosis not present

## 2022-09-22 DIAGNOSIS — R739 Hyperglycemia, unspecified: Secondary | ICD-10-CM | POA: Diagnosis not present

## 2022-09-22 DIAGNOSIS — M8580 Other specified disorders of bone density and structure, unspecified site: Secondary | ICD-10-CM | POA: Diagnosis not present

## 2022-09-26 IMAGING — MG MM DIGITAL SCREENING BILAT W/ TOMO AND CAD
6 of 10 series · 6 of 30 positions shown · non-contrast
Comparison: Previous exam(s).

CLINICAL DATA: Screening.

EXAM:
DIGITAL SCREENING BILATERAL MAMMOGRAM WITH TOMOSYNTHESIS AND CAD
TECHNIQUE: Bilateral screening digital craniocaudal and mediolateral oblique
mammograms were obtained. Bilateral screening digital breast
tomosynthesis was performed. The images were evaluated with
computer-aided detection.

[L MLO synth-2D (1 of 2)]
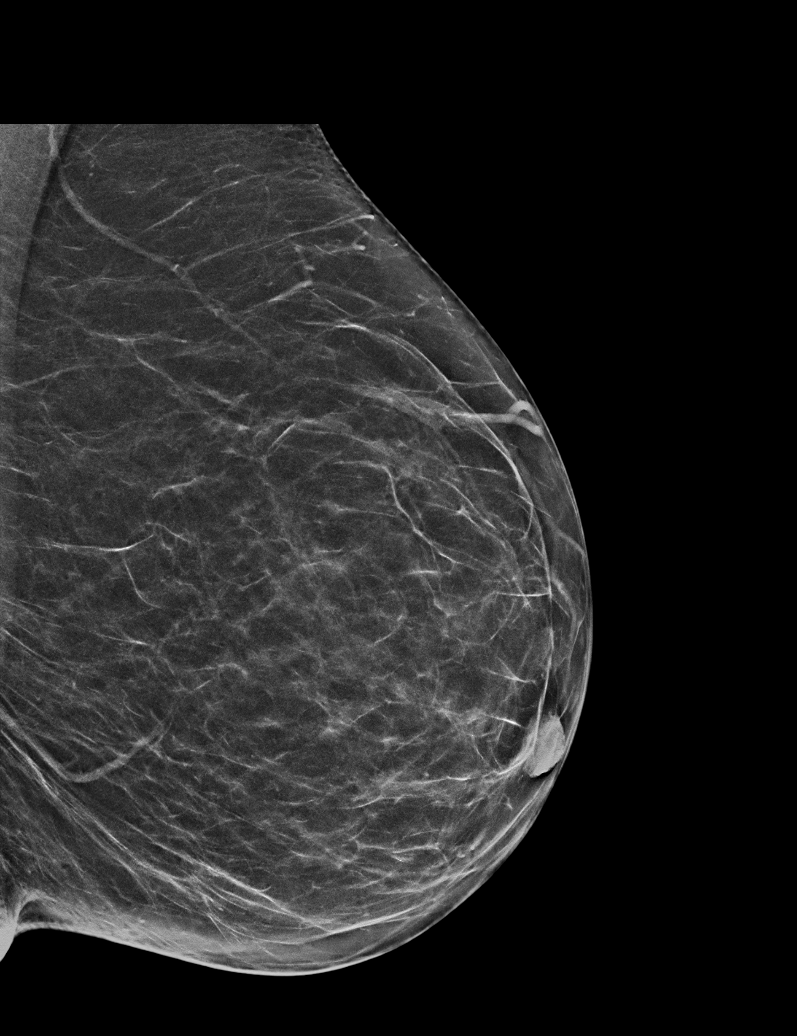

[R MLO synth-2D]
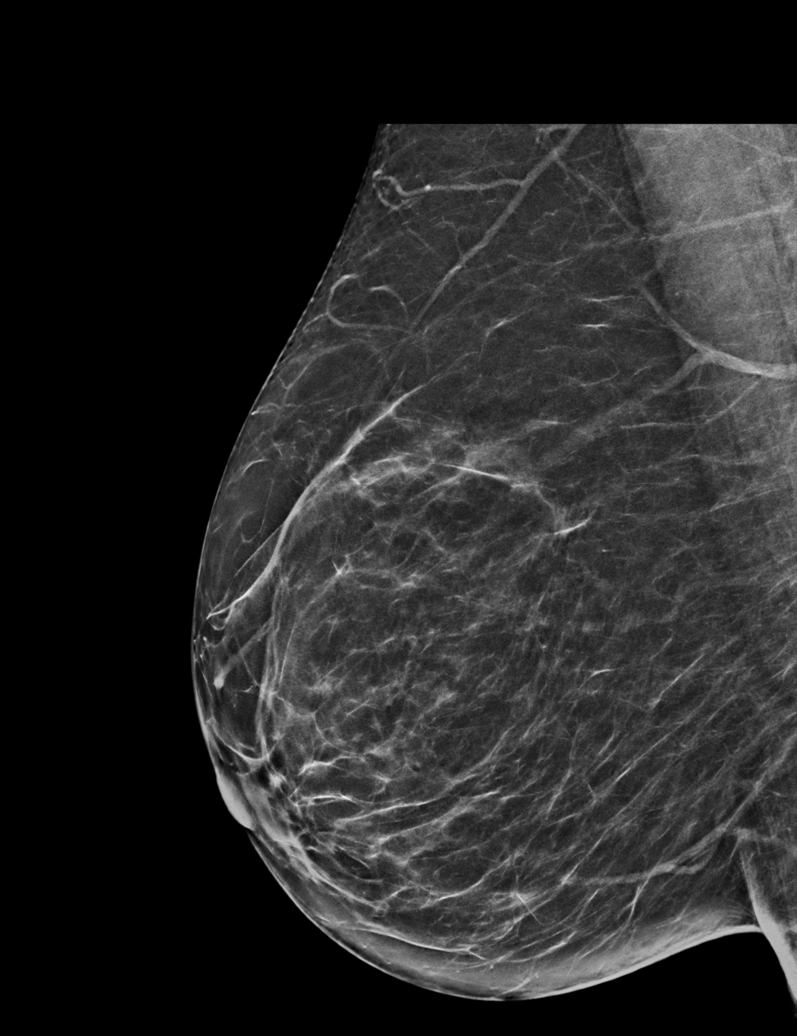

[L CC synth-2D]
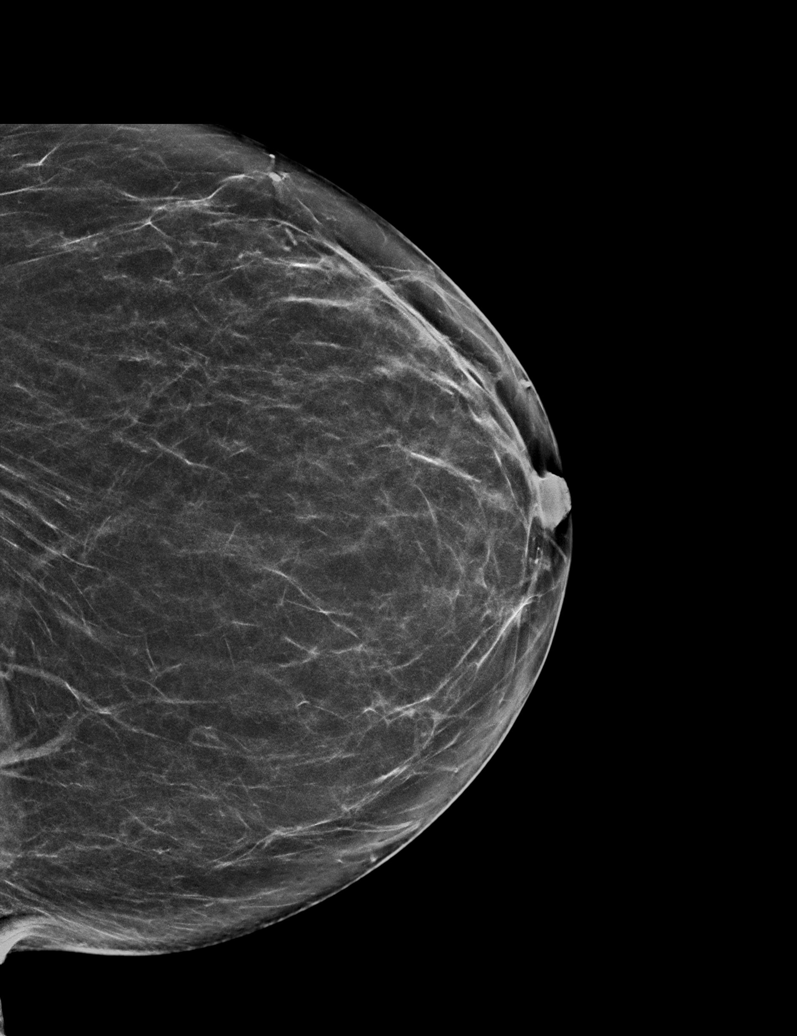

[L MLO synth-2D (2 of 2)]
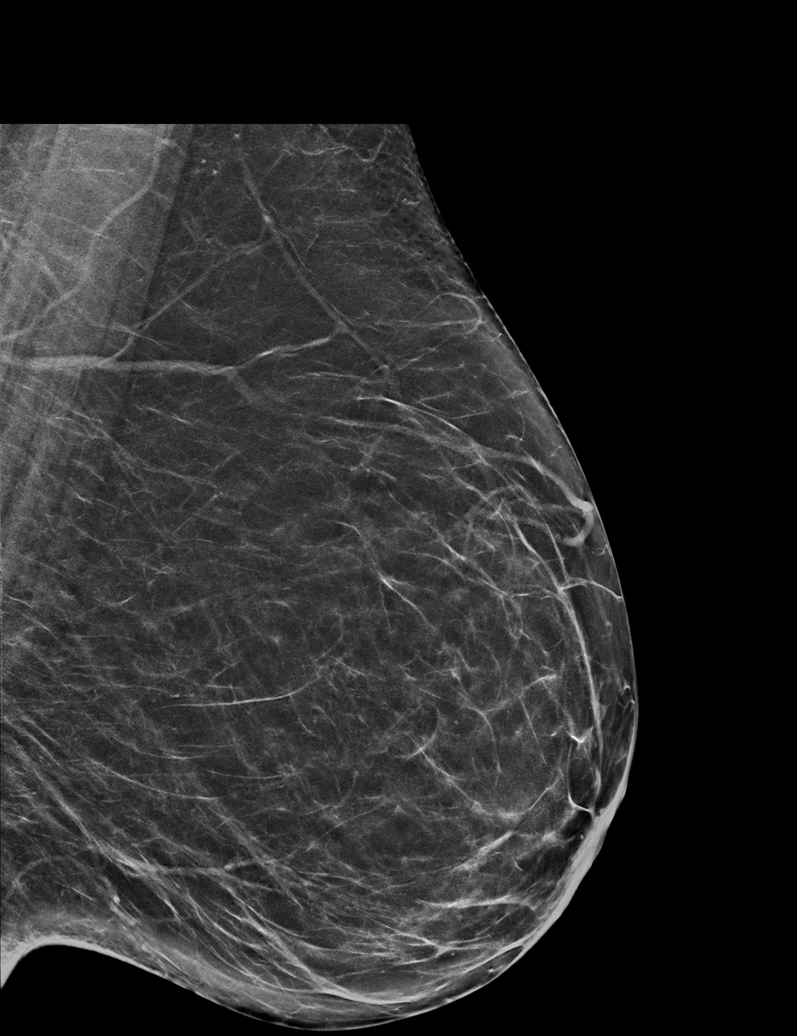

[R CC synth-2D]
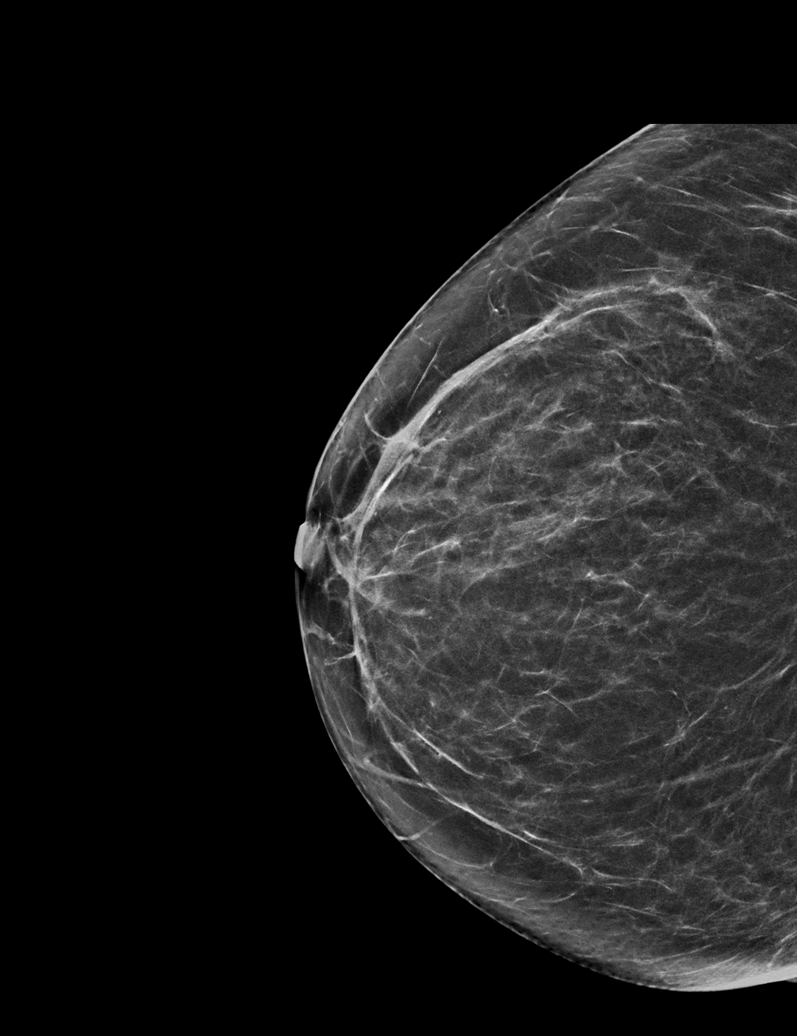

[L MLO tomo · tomo slice 31/60.0]
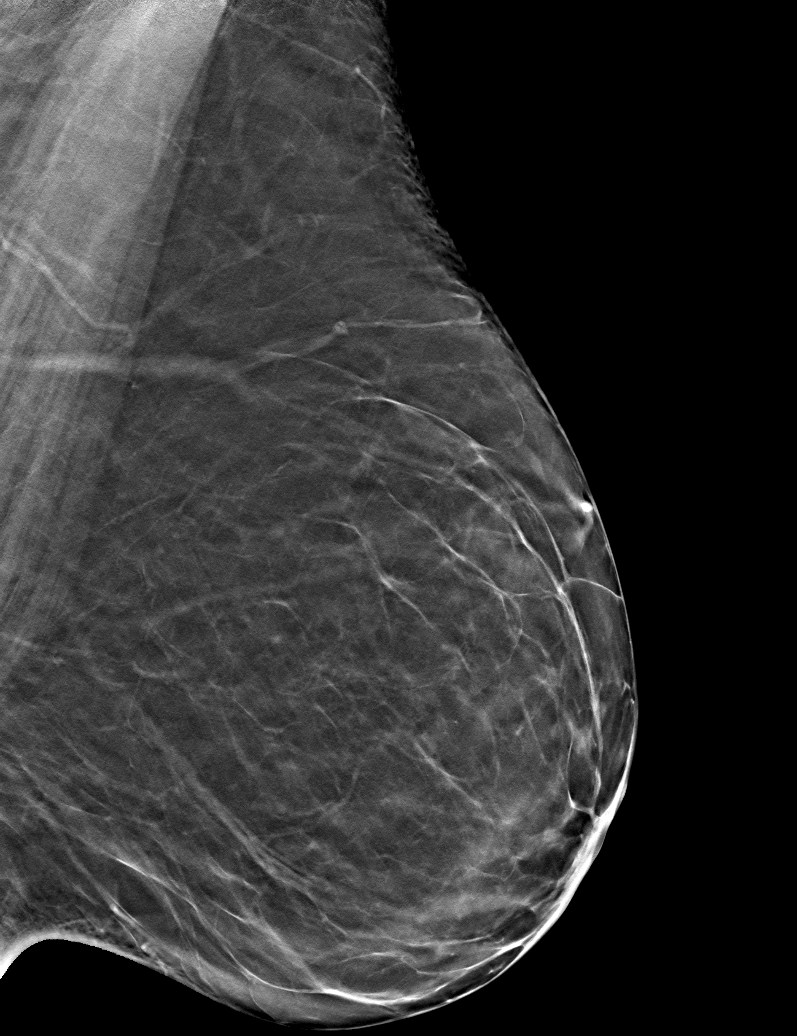

[6 of 30 positions shown; findings below may reference images not displayed]

ACR Breast Density Category b: There are scattered areas of
fibroglandular density.
FINDINGS: There are no findings suspicious for malignancy.
IMPRESSION: No mammographic evidence of malignancy. A result letter of this
screening mammogram will be mailed directly to the patient.

RECOMMENDATION:
Screening mammogram in one year. (Code:51-O-LD2)

BI-RADS CATEGORY  1: Negative.

## 2022-09-29 DIAGNOSIS — M4316 Spondylolisthesis, lumbar region: Secondary | ICD-10-CM | POA: Diagnosis not present

## 2022-09-29 DIAGNOSIS — G894 Chronic pain syndrome: Secondary | ICD-10-CM | POA: Diagnosis not present

## 2022-09-29 DIAGNOSIS — I10 Essential (primary) hypertension: Secondary | ICD-10-CM | POA: Diagnosis not present

## 2022-09-29 DIAGNOSIS — I639 Cerebral infarction, unspecified: Secondary | ICD-10-CM | POA: Diagnosis not present

## 2022-09-29 DIAGNOSIS — H10011 Acute follicular conjunctivitis, right eye: Secondary | ICD-10-CM | POA: Diagnosis not present

## 2022-10-05 ENCOUNTER — Encounter (HOSPITAL_COMMUNITY)
Admission: RE | Admit: 2022-10-05 | Discharge: 2022-10-05 | Disposition: A | Discharge status: Home / Self Care | Payer: Medicare HMO | Source: Ambulatory Visit | Attending: Physician Assistant | Admitting: Physician Assistant

## 2022-10-05 DIAGNOSIS — K3184 Gastroparesis: Secondary | ICD-10-CM | POA: Insufficient documentation

## 2022-10-05 DIAGNOSIS — Z0389 Encounter for observation for other suspected diseases and conditions ruled out: Secondary | ICD-10-CM | POA: Diagnosis not present

## 2022-10-05 MED ORDER — TECHNETIUM TC 99M SULFUR COLLOID
2.0000 | Freq: Once | INTRAVENOUS | Status: AC
  Administered 2022-10-05: 2.2 via ORAL

## 2022-10-08 ENCOUNTER — Ambulatory Visit (HOSPITAL_COMMUNITY): Admission: RE | Admit: 2022-10-08 | Payer: Medicare HMO | Source: Ambulatory Visit

## 2022-10-09 ENCOUNTER — Ambulatory Visit (HOSPITAL_COMMUNITY): Admission: RE | Admit: 2022-10-09 | Payer: Medicare HMO | Source: Ambulatory Visit

## 2022-10-09 ENCOUNTER — Encounter (HOSPITAL_COMMUNITY): Payer: Self-pay

## 2022-10-09 ENCOUNTER — Ambulatory Visit (HOSPITAL_COMMUNITY): Payer: Medicare HMO

## 2022-10-16 ENCOUNTER — Ambulatory Visit (HOSPITAL_COMMUNITY)
Admission: RE | Admit: 2022-10-16 | Discharge: 2022-10-16 | Disposition: A | Discharge status: Home / Self Care | Payer: Medicare HMO | Source: Ambulatory Visit | Attending: Physician Assistant | Admitting: Physician Assistant

## 2022-10-16 ENCOUNTER — Other Ambulatory Visit: Payer: Self-pay | Admitting: Physician Assistant

## 2022-10-16 ENCOUNTER — Encounter: Payer: Self-pay | Admitting: Physician Assistant

## 2022-10-16 DIAGNOSIS — R1012 Left upper quadrant pain: Secondary | ICD-10-CM

## 2022-10-16 DIAGNOSIS — R935 Abnormal findings on diagnostic imaging of other abdominal regions, including retroperitoneum: Secondary | ICD-10-CM

## 2022-10-16 DIAGNOSIS — N281 Cyst of kidney, acquired: Secondary | ICD-10-CM | POA: Diagnosis not present

## 2022-10-16 DIAGNOSIS — K7689 Other specified diseases of liver: Secondary | ICD-10-CM | POA: Diagnosis not present

## 2022-10-16 DIAGNOSIS — R1011 Right upper quadrant pain: Secondary | ICD-10-CM

## 2022-10-16 DIAGNOSIS — K8689 Other specified diseases of pancreas: Secondary | ICD-10-CM | POA: Diagnosis not present

## 2022-10-16 DIAGNOSIS — K838 Other specified diseases of biliary tract: Secondary | ICD-10-CM | POA: Diagnosis not present

## 2022-10-16 MED ORDER — GADOBUTROL 1 MMOL/ML IV SOLN
8.0000 mL | Freq: Once | INTRAVENOUS | Status: DC | PRN
Start: 2022-10-16 — End: 2022-10-17

## 2022-10-16 NOTE — Progress Notes (Addendum)
Pt here for her MRCP w/ wo contrast. IV attempts made by MRI techs x 4, unsuccessful. IV team consult placed, unsuccessful x1. Ultrasound used to assess. Call was placed to Radiologist Dr. Kris Hartmann who states to proceed with exam as w/o contrast.

## 2022-10-16 NOTE — Progress Notes (Signed)
A consult was placed to the IV Nurse for iv access prior to MRI;  staff attempted multiple times without success;  attempted x 1 , also without success; also assessed both arms with ultrasound; pt is very cold, mottled; have put warm blankets and a heat pack on;  no further attempts made.

## 2022-10-19 DIAGNOSIS — R935 Abnormal findings on diagnostic imaging of other abdominal regions, including retroperitoneum: Secondary | ICD-10-CM | POA: Diagnosis not present

## 2022-10-19 DIAGNOSIS — R1010 Upper abdominal pain, unspecified: Secondary | ICD-10-CM | POA: Diagnosis not present

## 2022-10-19 DIAGNOSIS — K582 Mixed irritable bowel syndrome: Secondary | ICD-10-CM | POA: Diagnosis not present

## 2022-10-19 DIAGNOSIS — K2960 Other gastritis without bleeding: Secondary | ICD-10-CM | POA: Diagnosis not present

## 2022-10-19 DIAGNOSIS — Z86010 Personal history of colonic polyps: Secondary | ICD-10-CM | POA: Diagnosis not present

## 2022-10-20 DIAGNOSIS — M4802 Spinal stenosis, cervical region: Secondary | ICD-10-CM | POA: Diagnosis not present

## 2022-10-20 DIAGNOSIS — M4722 Other spondylosis with radiculopathy, cervical region: Secondary | ICD-10-CM | POA: Diagnosis not present

## 2022-10-20 DIAGNOSIS — M5417 Radiculopathy, lumbosacral region: Secondary | ICD-10-CM | POA: Diagnosis not present

## 2022-10-20 DIAGNOSIS — M4316 Spondylolisthesis, lumbar region: Secondary | ICD-10-CM | POA: Diagnosis not present

## 2022-10-20 DIAGNOSIS — Z981 Arthrodesis status: Secondary | ICD-10-CM | POA: Diagnosis not present

## 2022-10-20 DIAGNOSIS — M4726 Other spondylosis with radiculopathy, lumbar region: Secondary | ICD-10-CM | POA: Diagnosis not present

## 2022-10-20 DIAGNOSIS — M5412 Radiculopathy, cervical region: Secondary | ICD-10-CM | POA: Diagnosis not present

## 2022-10-20 DIAGNOSIS — M5116 Intervertebral disc disorders with radiculopathy, lumbar region: Secondary | ICD-10-CM | POA: Diagnosis not present

## 2022-10-28 DIAGNOSIS — I639 Cerebral infarction, unspecified: Secondary | ICD-10-CM | POA: Diagnosis not present

## 2022-10-28 DIAGNOSIS — F5101 Primary insomnia: Secondary | ICD-10-CM | POA: Diagnosis not present

## 2022-10-28 DIAGNOSIS — R69 Illness, unspecified: Secondary | ICD-10-CM | POA: Diagnosis not present

## 2022-10-28 DIAGNOSIS — E271 Primary adrenocortical insufficiency: Secondary | ICD-10-CM | POA: Diagnosis not present

## 2022-10-28 DIAGNOSIS — I10 Essential (primary) hypertension: Secondary | ICD-10-CM | POA: Diagnosis not present

## 2022-10-28 DIAGNOSIS — Z6829 Body mass index (BMI) 29.0-29.9, adult: Secondary | ICD-10-CM | POA: Diagnosis not present

## 2022-10-28 DIAGNOSIS — E663 Overweight: Secondary | ICD-10-CM | POA: Diagnosis not present

## 2022-10-28 DIAGNOSIS — F1120 Opioid dependence, uncomplicated: Secondary | ICD-10-CM | POA: Diagnosis not present

## 2022-10-28 DIAGNOSIS — M1991 Primary osteoarthritis, unspecified site: Secondary | ICD-10-CM | POA: Diagnosis not present

## 2022-10-28 DIAGNOSIS — M48061 Spinal stenosis, lumbar region without neurogenic claudication: Secondary | ICD-10-CM | POA: Diagnosis not present

## 2022-10-28 DIAGNOSIS — I700 Atherosclerosis of aorta: Secondary | ICD-10-CM | POA: Diagnosis not present

## 2022-10-28 DIAGNOSIS — F419 Anxiety disorder, unspecified: Secondary | ICD-10-CM | POA: Diagnosis not present

## 2022-10-28 DIAGNOSIS — M4802 Spinal stenosis, cervical region: Secondary | ICD-10-CM | POA: Diagnosis not present

## 2022-11-03 ENCOUNTER — Ambulatory Visit (HOSPITAL_BASED_OUTPATIENT_CLINIC_OR_DEPARTMENT_OTHER): Payer: Medicare HMO | Admitting: Cardiology

## 2022-11-08 NOTE — Telephone Encounter (Signed)
Pt states she no longer needs this therapy

## 2022-11-08 NOTE — Telephone Encounter (Signed)
Patient no longer needs this therapy

## 2022-11-18 DIAGNOSIS — R269 Unspecified abnormalities of gait and mobility: Secondary | ICD-10-CM | POA: Diagnosis not present

## 2022-11-18 DIAGNOSIS — E785 Hyperlipidemia, unspecified: Secondary | ICD-10-CM | POA: Diagnosis not present

## 2022-11-18 DIAGNOSIS — K5900 Constipation, unspecified: Secondary | ICD-10-CM | POA: Diagnosis not present

## 2022-11-18 DIAGNOSIS — E039 Hypothyroidism, unspecified: Secondary | ICD-10-CM | POA: Diagnosis not present

## 2022-11-18 DIAGNOSIS — R69 Illness, unspecified: Secondary | ICD-10-CM | POA: Diagnosis not present

## 2022-11-18 DIAGNOSIS — Z008 Encounter for other general examination: Secondary | ICD-10-CM | POA: Diagnosis not present

## 2022-11-18 DIAGNOSIS — M62838 Other muscle spasm: Secondary | ICD-10-CM | POA: Diagnosis not present

## 2022-11-18 DIAGNOSIS — G629 Polyneuropathy, unspecified: Secondary | ICD-10-CM | POA: Diagnosis not present

## 2022-11-18 DIAGNOSIS — J309 Allergic rhinitis, unspecified: Secondary | ICD-10-CM | POA: Diagnosis not present

## 2022-11-18 DIAGNOSIS — F419 Anxiety disorder, unspecified: Secondary | ICD-10-CM | POA: Diagnosis not present

## 2022-11-18 DIAGNOSIS — M1990 Unspecified osteoarthritis, unspecified site: Secondary | ICD-10-CM | POA: Diagnosis not present

## 2022-11-18 DIAGNOSIS — K219 Gastro-esophageal reflux disease without esophagitis: Secondary | ICD-10-CM | POA: Diagnosis not present

## 2022-11-18 DIAGNOSIS — Z79891 Long term (current) use of opiate analgesic: Secondary | ICD-10-CM | POA: Diagnosis not present

## 2022-11-18 DIAGNOSIS — G43909 Migraine, unspecified, not intractable, without status migrainosus: Secondary | ICD-10-CM | POA: Diagnosis not present

## 2022-11-25 DIAGNOSIS — M1991 Primary osteoarthritis, unspecified site: Secondary | ICD-10-CM | POA: Diagnosis not present

## 2022-11-25 DIAGNOSIS — M4802 Spinal stenosis, cervical region: Secondary | ICD-10-CM | POA: Diagnosis not present

## 2022-11-25 DIAGNOSIS — E273 Drug-induced adrenocortical insufficiency: Secondary | ICD-10-CM | POA: Diagnosis not present

## 2022-11-25 DIAGNOSIS — G894 Chronic pain syndrome: Secondary | ICD-10-CM | POA: Diagnosis not present

## 2022-11-29 DIAGNOSIS — M5412 Radiculopathy, cervical region: Secondary | ICD-10-CM | POA: Diagnosis not present

## 2022-11-29 DIAGNOSIS — M5417 Radiculopathy, lumbosacral region: Secondary | ICD-10-CM | POA: Diagnosis not present

## 2022-11-29 DIAGNOSIS — R946 Abnormal results of thyroid function studies: Secondary | ICD-10-CM | POA: Diagnosis not present

## 2022-12-06 ENCOUNTER — Other Ambulatory Visit: Payer: Self-pay | Admitting: Gastroenterology

## 2022-12-15 ENCOUNTER — Encounter (HOSPITAL_COMMUNITY): Payer: Self-pay | Admitting: Gastroenterology

## 2022-12-15 NOTE — Progress Notes (Signed)
Attempted to obtain medical history via telephone, unable to reach at this time. HIPAA compliant voicemail message left requesting return call to pre surgical testing department. 

## 2022-12-21 NOTE — Anesthesia Preprocedure Evaluation (Signed)
Anesthesia Evaluation  Patient identified by MRN, date of birth, ID band Patient awake    Reviewed: Allergy & Precautions, NPO status , Patient's Chart, lab work & pertinent test results, reviewed documented beta blocker date and time   History of Anesthesia Complications (+) PONV and history of anesthetic complications  Airway Mallampati: II  TM Distance: >3 FB Neck ROM: Full    Dental  (+) Edentulous Lower, Edentulous Upper   Pulmonary former smoker   Pulmonary exam normal        Cardiovascular hypertension, Pt. on medications and Pt. on home beta blockers + CAD and +CHF  Normal cardiovascular exam  TTE 01/2021: EF 55-60%, mild LVH, valves ok     Neuro/Psych  Headaches  Anxiety     CVA (2022)    GI/Hepatic Neg liver ROS,GERD  ,,  Endo/Other  Hypothyroidism  Addison's disease, on hydrocortisone 30mg  total daily dose  Renal/GU negative Renal ROS     Musculoskeletal  (+) Arthritis ,    Abdominal   Peds  Hematology negative hematology ROS (+)   Anesthesia Other Findings Day of surgery medications reviewed with patient.  Reproductive/Obstetrics                              Anesthesia Physical Anesthesia Plan  ASA: 3  Anesthesia Plan: MAC   Post-op Pain Management: Minimal or no pain anticipated   Induction:   PONV Risk Score and Plan: 3 and Treatment may vary due to age or medical condition and Propofol infusion  Airway Management Planned: Natural Airway  Additional Equipment: None  Intra-op Plan:   Post-operative Plan:   Informed Consent: I have reviewed the patients History and Physical, chart, labs and discussed the procedure including the risks, benefits and alternatives for the proposed anesthesia with the patient or authorized representative who has indicated his/her understanding and acceptance.       Plan Discussed with: CRNA  Anesthesia Plan Comments: (Given  hydrocortisone 50mg  IV prior to procedure as ordered by endocrinologist. Stephannie Peters, MD)        Anesthesia Quick Evaluation

## 2022-12-22 ENCOUNTER — Ambulatory Visit (HOSPITAL_COMMUNITY)
Admission: RE | Admit: 2022-12-22 | Discharge: 2022-12-22 | Disposition: A | Discharge status: Home / Self Care | Payer: Medicare HMO | Attending: Gastroenterology | Admitting: Gastroenterology

## 2022-12-22 ENCOUNTER — Ambulatory Visit (HOSPITAL_BASED_OUTPATIENT_CLINIC_OR_DEPARTMENT_OTHER): Payer: Medicare HMO | Admitting: Anesthesiology

## 2022-12-22 ENCOUNTER — Other Ambulatory Visit: Payer: Self-pay

## 2022-12-22 ENCOUNTER — Encounter (HOSPITAL_COMMUNITY): Payer: Self-pay | Admitting: Gastroenterology

## 2022-12-22 ENCOUNTER — Ambulatory Visit (HOSPITAL_COMMUNITY): Payer: Medicare HMO | Admitting: Anesthesiology

## 2022-12-22 ENCOUNTER — Encounter (HOSPITAL_COMMUNITY)
Admission: RE | Disposition: A | Discharge status: Home / Self Care | Payer: Self-pay | Source: Home / Self Care | Attending: Gastroenterology

## 2022-12-22 DIAGNOSIS — I110 Hypertensive heart disease with heart failure: Secondary | ICD-10-CM | POA: Insufficient documentation

## 2022-12-22 DIAGNOSIS — M1990 Unspecified osteoarthritis, unspecified site: Secondary | ICD-10-CM | POA: Insufficient documentation

## 2022-12-22 DIAGNOSIS — E271 Primary adrenocortical insufficiency: Secondary | ICD-10-CM | POA: Insufficient documentation

## 2022-12-22 DIAGNOSIS — Z9049 Acquired absence of other specified parts of digestive tract: Secondary | ICD-10-CM | POA: Diagnosis not present

## 2022-12-22 DIAGNOSIS — K838 Other specified diseases of biliary tract: Secondary | ICD-10-CM | POA: Diagnosis not present

## 2022-12-22 DIAGNOSIS — E039 Hypothyroidism, unspecified: Secondary | ICD-10-CM | POA: Diagnosis not present

## 2022-12-22 DIAGNOSIS — R933 Abnormal findings on diagnostic imaging of other parts of digestive tract: Secondary | ICD-10-CM | POA: Diagnosis not present

## 2022-12-22 DIAGNOSIS — K2970 Gastritis, unspecified, without bleeding: Secondary | ICD-10-CM | POA: Insufficient documentation

## 2022-12-22 DIAGNOSIS — F419 Anxiety disorder, unspecified: Secondary | ICD-10-CM | POA: Diagnosis not present

## 2022-12-22 DIAGNOSIS — Z8673 Personal history of transient ischemic attack (TIA), and cerebral infarction without residual deficits: Secondary | ICD-10-CM | POA: Diagnosis not present

## 2022-12-22 DIAGNOSIS — Z87891 Personal history of nicotine dependence: Secondary | ICD-10-CM | POA: Insufficient documentation

## 2022-12-22 DIAGNOSIS — R1012 Left upper quadrant pain: Secondary | ICD-10-CM | POA: Diagnosis not present

## 2022-12-22 DIAGNOSIS — R935 Abnormal findings on diagnostic imaging of other abdominal regions, including retroperitoneum: Secondary | ICD-10-CM | POA: Insufficient documentation

## 2022-12-22 DIAGNOSIS — I509 Heart failure, unspecified: Secondary | ICD-10-CM

## 2022-12-22 DIAGNOSIS — Z09 Encounter for follow-up examination after completed treatment for conditions other than malignant neoplasm: Secondary | ICD-10-CM | POA: Insufficient documentation

## 2022-12-22 DIAGNOSIS — I2510 Atherosclerotic heart disease of native coronary artery without angina pectoris: Secondary | ICD-10-CM | POA: Insufficient documentation

## 2022-12-22 DIAGNOSIS — R1013 Epigastric pain: Secondary | ICD-10-CM | POA: Diagnosis not present

## 2022-12-22 HISTORY — PX: EUS: SHX5427

## 2022-12-22 HISTORY — PX: ESOPHAGOGASTRODUODENOSCOPY (EGD) WITH PROPOFOL: SHX5813

## 2022-12-22 HISTORY — PX: BIOPSY: SHX5522

## 2022-12-22 SURGERY — ESOPHAGOGASTRODUODENOSCOPY (EGD) WITH PROPOFOL
Anesthesia: Monitor Anesthesia Care | Laterality: Bilateral

## 2022-12-22 MED ORDER — LACTATED RINGERS IV SOLN: INTRAVENOUS | Status: DC | PRN

## 2022-12-22 MED ORDER — LACTATED RINGERS IV SOLN
Freq: Once | INTRAVENOUS | Status: AC
  Administered 2022-12-22: 1000 mL via INTRAVENOUS

## 2022-12-22 MED ORDER — LIDOCAINE 2% (20 MG/ML) 5 ML SYRINGE
INTRAMUSCULAR | Status: DC | PRN
  Administered 2022-12-22: 40 mg via INTRAVENOUS

## 2022-12-22 MED ORDER — HYDROCORTISONE SOD SUC (PF) 100 MG IJ SOLR
50.0000 mg | INTRAMUSCULAR | Status: AC
  Administered 2022-12-22: 50 mg via INTRAVENOUS
  Filled 2022-12-22: qty 1

## 2022-12-22 MED ORDER — PROPOFOL 500 MG/50ML IV EMUL
INTRAVENOUS | Status: DC | PRN
  Administered 2022-12-22: 125 ug/kg/min via INTRAVENOUS

## 2022-12-22 MED ORDER — PROPOFOL 10 MG/ML IV BOLUS
INTRAVENOUS | Status: DC | PRN
  Administered 2022-12-22: 40 mg via INTRAVENOUS

## 2022-12-22 MED ORDER — SODIUM CHLORIDE 0.9 % IV SOLN: INTRAVENOUS | Status: DC

## 2022-12-22 SURGICAL SUPPLY — 15 items

## 2022-12-22 NOTE — Anesthesia Postprocedure Evaluation (Signed)
Anesthesia Post Note  Patient: MERIBETH BIENER  Procedure(s) Performed: ESOPHAGOGASTRODUODENOSCOPY (EGD) WITH PROPOFOL (Bilateral) UPPER ENDOSCOPIC ULTRASOUND (EUS) LINEAR (Bilateral) BIOPSY     Patient location during evaluation: PACU Anesthesia Type: MAC Level of consciousness: awake and alert Pain management: pain level controlled Vital Signs Assessment: post-procedure vital signs reviewed and stable Respiratory status: spontaneous breathing, nonlabored ventilation and respiratory function stable Cardiovascular status: blood pressure returned to baseline Postop Assessment: no apparent nausea or vomiting Anesthetic complications: no   No notable events documented.  Last Vitals:  Vitals:   12/22/22 1020 12/22/22 1030  BP: (!) 113/59 (!) 125/53  Pulse: 62 (!) 59  Resp: 11 10  Temp:    SpO2: 95% 94%    Last Pain:  Vitals:   12/22/22 1030  TempSrc:   PainSc: 0-No pain                 Shanda Howells

## 2022-12-22 NOTE — Transfer of Care (Signed)
Immediate Anesthesia Transfer of Care Note  Patient: [name]  Procedure(s) Performed: ESOPHAGOGASTRODUODENOSCOPY (EGD) WITH PROPOFOL (Bilateral) UPPER ENDOSCOPIC ULTRASOUND (EUS) LINEAR (Bilateral) BIOPSY  Patient Location: PACU and Endoscopy Unit  Anesthesia Type:MAC  Level of Consciousness: sedated  Airway & Oxygen Therapy: Patient Spontanous Breathing and Patient connected to face mask oxygen  Post-op Assessment: Report given to RN and Post -op Vital signs reviewed and stable  Post vital signs: Reviewed and stable  Last Vitals:  Vitals Value Taken Time  BP 113/57 12/22/22 1012  Temp    Pulse 57 12/22/22 1013  Resp 9 12/22/22 1013  SpO2 100 % 12/22/22 1013  Vitals shown include unvalidated device data.  Last Pain:  Vitals:   12/22/22 0846  TempSrc: Tympanic  PainSc: 7       Patients Stated Pain Goal: 4 (12/22/22 0846)  Complications: No notable events documented.

## 2022-12-22 NOTE — Op Note (Signed)
[facility] Patient Name: [name] Procedure Date: 12/22/2022 MRN: [id number] Attending MD: Willis Modena , MD, [id number] Date of Birth: Jul 15, 1966 CSN: [id number] Age: 56 Admit Type: Outpatient Procedure:                Upper EUS Indications:              Abnormal abdominal MRI, Epigastric abdominal pain,                            Abdominal pain in the left upper quadrant Providers:                Willis Modena, MD, Lorenza Evangelist, RN, Beryle Beams, Technician, Heron Nay, CRNA Referring MD:             Willis Modena, MD Medicines:                Monitored Anesthesia Care Complications:            No immediate complications. Estimated Blood Loss:     Estimated blood loss: none. Procedure:                Pre-Anesthesia Assessment:                           - Prior to the procedure, a History and Physical                            was performed, and patient medications and                            allergies were reviewed. The patient's tolerance of                            previous anesthesia was also reviewed. The risks                            and benefits of the procedure and the sedation                            options and risks were discussed with the patient.                            All questions were answered, and informed consent                            was obtained. Prior Anticoagulants: The patient has                            taken no anticoagulant or antiplatelet agents. ASA                            Grade Assessment: II - A patient with mild systemic  disease. After reviewing the risks and benefits,                            the patient was deemed in satisfactory condition to                            undergo the procedure.                           After obtaining informed consent, the endoscope was                            passed under direct vision. Throughout the                             procedure, the patient's blood pressure, pulse, and                            oxygen saturations were monitored continuously. The                            [id number]-[id number] ([id number] Olympus endoscope was introduced                            through the mouth, and advanced to the second part                            of duodenum. The [id number] ([id number]) Olympus linear                            ultrasound scope was introduced through the mouth,                            and advanced to the second part of duodenum. The                            upper EUS was accomplished without difficulty. The                            patient tolerated the procedure well. Scope In: Scope Out: Findings:      ENDOSCOPIC FINDING: :      The examined esophagus was normal.      Patchy mild inflammation was found in the entire examined stomach.       Biopsies were taken with a cold forceps for histology.      The exam of the stomach was otherwise normal.      The duodenal bulb, first portion of the duodenum and second portion of       the duodenum were normal. Ampulla slightly prominent but otherwise       normal.      ENDOSONOGRAPHIC FINDING: :      Endosonographic images of the stomach were unremarkable.      There was no sign of significant endosonographic abnormality in the left       lobe of the liver. Homogeneous parenchyma was identified.  There was no sign of significant endosonographic abnormality in the       ampulla. No masses were identified.      The pancreatic duct had a dilated endosonographic appearance in the       pancreatic head, uncinate process of the pancreas, genu of the pancreas       and body of the pancreas. The pancreatic duct measured up to 3 mm in       diameter.      Evidence of a previous cholecystectomy was identified       endosonographically.      There was dilation in the common bile duct which measured up to 8 mm.      No lymphadenopathy  seen. Impression:               - Normal esophagus.                           - Gastritis. Biopsied.                           - Normal duodenal bulb, first portion of the                            duodenum and second portion of the duodenum.                           - Endosonographic images of the stomach were                            unremarkable.                           - There was no evidence of significant pathology in                            the left lobe of the liver.                           - There was no sign of significant pathology in the                            ampulla.                           - The pancreatic duct had a dilated endosonographic                            appearance in the pancreatic head, uncinate process                            of the pancreas, genu of the pancreas and body of                            the pancreas. The pancreatic duct measured up to 3  mm in diameter.                           - Evidence of a cholecystectomy.                           - There was dilation in the common bile duct which                            measured up to 8 mm.                           - No evidence of pancreatic or ampullary or                            intraductal mass lesion. Moderate Sedation:      Not Applicable - Patient had care per Anesthesia. Recommendation:           - Discharge patient to home (via wheelchair).                           - Resume previous diet today.                           - Continue present medications.                           - Return to GI clinic at appointment to be                            scheduled.                           - Await path results.                           - Return to referring physician as previously                            scheduled. Procedure Code(s):        --- Professional ---                           [phone], Esophagogastroduodenoscopy, flexible,                             transoral; with endoscopic ultrasound examination,                            including the esophagus, stomach, and either the                            duodenum or a surgically altered stomach where the                            jejunum is examined distal to the anastomosis  03491, Esophagogastroduodenoscopy, flexible,                            transoral; with biopsy, single or multiple Diagnosis Code(s):        --- Professional ---                           K29.70, Gastritis, unspecified, without bleeding                           Z90.49, Acquired absence of other specified parts                            of digestive tract                           R10.13, Epigastric pain                           R10.12, Left upper quadrant pain                           R93.3, Abnormal findings on diagnostic imaging of                            other parts of digestive tract                           K83.8, Other specified diseases of biliary tract                           R93.5, Abnormal findings on diagnostic imaging of                            other abdominal regions, including retroperitoneum CPT copyright 2022 [employer]. All rights reserved. The codes documented in this report are preliminary and upon coder review may  be revised to meet current compliance requirements. Willis Modena, MD 12/22/2022 10:40:29 AM This report has been signed electronically. Number of Addenda: 0

## 2022-12-22 NOTE — H&P (Signed)
[facility] H/P Note  Chief Complaint: abnormal ampulla, abdominal pain  HPI: [name] is an 57 y.o. female.  Here evaluation recurrent upper abdominal pain and abnormal MRI.  Past Medical History:  Diagnosis Date   Addison's disease    Addison's disease    Adrenal insufficiency    diagnosed 2012   Aneurysm    Anxiety    Arthritis    Astigmatism    CAD (coronary artery disease)    Cath 2008 EF normal. RCA 50-60, Septal 50%. Myoview 3/12: EF 53% normal perfusion   Cardiac arrest    2/2 adissonian crisis   Cardiomyopathy    resolved   Chest pain    chronicc   CHF (congestive heart failure)    Chronic back pain    Chronic diarrhea    Concussion    sept 28th 2014   Family history of breast cancer    Family history of colon cancer    Gastroparesis    GERD (gastroesophageal reflux disease)    Headache    migraines   HTN (hypertension)    Hyperlipidemia    Hypothyroidism    Mitral valve prolapse    Nondiabetic gastroparesis    PONV (postoperative nausea and vomiting)    Pre-diabetes    QT prolongation    Stroke    possible stroke 01/2021   Tobacco abuse    down to 2 cigarettes per day   Vertigo     Past Surgical History:  Procedure Laterality Date   ABDOMINAL HYSTERECTOMY     CARDIAC CATHETERIZATION  03/07/2007   showed 60% lesion in the right coronary artery   CHOLECYSTECTOMY     COLONOSCOPY     LEFT HEART CATHETERIZATION WITH CORONARY ANGIOGRAM N/A 04/13/2012   Procedure: LEFT HEART CATHETERIZATION WITH CORONARY ANGIOGRAM;  Surgeon: Herby Abraham, MD;  Location: [facility] CATH LAB;  Service: Cardiovascular;  Laterality: N/A;   LUMBAR LAMINECTOMY/DECOMPRESSION MICRODISCECTOMY Left 12/20/2018   Procedure: Left Lumbar Three-Four Lumbar Laminotomy and Foraminotomy, Lumbar Four-Five Laminotomy and Foraminotomy with Microdiscectomy and Resection of Synovial Cyst;  Surgeon: Shirlean Kelly, MD;  Location: [facility] OR;  Service: Neurosurgery;  Laterality: Left;   Left Lumbar 3-4 Lumbar laminotomy, foraminotomy, possible microdiscectomy with possible resection of synovial cyst   SPINE SURGERY     VARICOSE VEIN SURGERY     VESICOVAGINAL FISTULA CLOSURE W/ TAH      Medications Prior to Admission  Medication Sig Dispense Refill   alendronate (FOSAMAX) 70 MG tablet Take 70 mg by mouth once a week. Take with a full glass of water on an empty stomach On Mondays     ALPRAZolam (XANAX) 0.5 MG tablet Take 0.5 mg by mouth 3 (three) times daily as needed for anxiety.     atorvastatin (LIPITOR) 40 MG tablet Take 1 tablet (40 mg total) by mouth daily. 90 tablet 3   buPROPion (WELLBUTRIN SR) 150 MG 12 hr tablet Take 150 mg by mouth daily.     Calcium Carbonate (CALCIUM 500 PO) Take 500 mg by mouth 3 (three) times daily.     carvedilol (COREG) 6.25 MG tablet Take 1 tablet (6.25 mg total) by mouth 2 (two) times daily with a meal. 180 tablet 3   Cholecalciferol (VITAMIN D-1000 MAX ST) 25 MCG (1000 UT) tablet Take 1,000 Units by mouth daily.     cyclobenzaprine (FLEXERIL) 10 MG tablet Take 10 mg by mouth 3 (three) times daily as needed for muscle spasms.     DULoxetine (CYMBALTA)  60 MG capsule Take 1 capsule by mouth daily.     EPINEPHrine 0.3 mg/0.3 mL IJ SOAJ injection Inject 0.3 mg into the muscle as needed for anaphylaxis.     gabapentin (NEURONTIN) 300 MG capsule Take 300 mg by mouth daily as needed (Leg pain). In the evening as needed     gabapentin (NEURONTIN) 600 MG tablet Take 600 mg by mouth in the morning, at noon, in the evening, and at bedtime.     hydrocortisone (CORTEF) 10 MG tablet Take 10-20 mg by mouth See admin instructions. Takes 20 mg by mouth in the morning and 10 mg at bedtime     hyoscyamine (LEVSIN) 0.125 MG tablet Take 0.125 mg by mouth 3 (three) times daily as needed for cramping.     levothyroxine (SYNTHROID) 75 MCG tablet Take 75 mcg by mouth daily before breakfast.     meclizine (ANTIVERT) 25 MG tablet 1 or 2 tabs PO q8h prn dizziness  (Patient taking differently: Take 25 mg by mouth 3 (three) times daily as needed for dizziness.) 15 tablet 0   nitroGLYCERIN (NITROSTAT) 0.4 MG SL tablet Place 1 tablet (0.4 mg total) under the tongue every 5 (five) minutes as needed for chest pain. 25 tablet 4   ondansetron (ZOFRAN) 4 MG tablet Take 4 mg by mouth every 6 (six) hours as needed for nausea or vomiting.     oxyCODONE 20 MG TABS Take 1 tablet (20 mg total) by mouth every 6 (six) hours as needed for severe pain ((score 7 to 10)). 30 tablet 0   topiramate (TOPAMAX) 25 MG tablet Take 25 mg by mouth 2 (two) times daily.     zolpidem (AMBIEN) 10 MG tablet Take 10 mg by mouth at bedtime.     albuterol (VENTOLIN HFA) 108 (90 Base) MCG/ACT inhaler Inhale 1-2 puffs into the lungs every 6 (six) hours as needed for wheezing or shortness of breath.       Allergies:  Allergies  Allergen Reactions   Bee Venom Anaphylaxis   Doxycycline Other (See Comments) and Nausea Only    Due to Pre-Existing conditions involved with stomach, patient does not take the following medication   Erythromycin Other (See Comments) and Nausea And Vomiting    Due to Pre-Existing conditions involved with stomach, patient does not take the following medication   Ibuprofen Nausea Only    gastroparesis    Penicillins Anaphylaxis and Swelling    Has patient had a PCN reaction causing immediate rash, facial/tongue/throat swelling, SOB or lightheadedness with hypotension: Yes Has patient had a PCN reaction causing severe rash involving mucus membranes or skin necrosis: Yes Has patient had a PCN reaction that required hospitalization No Has patient had a PCN reaction occurring within the last 10 years: No If all of the above answers are "NO", then may proceed with Cephalosporin use.    Sulfa Antibiotics Hives and Itching    Pt states she can take now as an adult    Cat Hair Extract Hives, Itching and Swelling    SWELLING REACTION UNSPECIFIED    Dust Mite Extract Hives  and Swelling   Tramadol Diarrhea   Bactrim [Sulfamethoxazole-Trimethoprim]     Pt states she was allergic as a child, but can take now     Family History  Problem Relation Age of Onset   Cancer Father        Colon   Coronary artery disease Other    Heart attack Mother  d. 51   Breast cancer Paternal Uncle 15   Dementia Maternal Grandmother    Leukemia Maternal Uncle 1    Social History:  reports that she quit smoking about 22 months ago. Her smoking use included cigarettes. She has a 2.50 pack-year smoking history. She has never used smokeless tobacco. She reports current drug use. Drugs: Marijuana and Oxycodone. She reports that she does not drink alcohol.   ROS: As per HPI, all others negative   Blood pressure 136/72, temperature 98 F (36.7 C), temperature source Tympanic, resp. rate (!) 9, height 5' 5.5" (1.664 m), weight 81.6 kg, SpO2 94 %. General appearance: NAD HEENT:  [place]/AT, anicteric NECK:  Supple RESP:   No labored breathing ABD:  Soft, mild upper abdominal tenderness NEURO:  A/O, no encephalopathy  No results found for this or any previous visit (from the past 48 hour(s)). No results found.  Assessment/Plan   Upper abdominal pain. 2.  Abnormal MRI:  possible ampullary thickening, biliary ectasia.  LFTs recently normal. Risks (bleeding, infection, bowel perforation that could require surgery, sedation-related changes in cardiopulmonary systems), benefits (identification and possible treatment of source of symptoms, exclusion of certain causes of symptoms), and alternatives (watchful waiting, radiographic imaging studies, empiric medical treatment) of upper endoscopy and upper endoscopy with ultrasound with possible fine needle aspiration biopsies (EGD + EUS +/- FNA) were explained to patient/family in detail and patient wishes to proceed.   Freddy Jaksch 12/22/2022, 9:24 AM

## 2022-12-22 NOTE — Anesthesia Procedure Notes (Signed)
Procedure Name: MAC Date/Time: 12/22/2022 9:32 AM  Performed by: Minerva Ends, CRNAPre-anesthesia Checklist: Patient identified, Emergency Drugs available, Suction available, Timeout performed and Patient being monitored Patient Re-evaluated:Patient Re-evaluated prior to induction Oxygen Delivery Method: Simple face mask Placement Confirmation: positive ETCO2 and breath sounds checked- equal and bilateral Dental Injury: Teeth and Oropharynx as per pre-operative assessment  Comments: Bite block by RN

## 2022-12-22 NOTE — Discharge Instructions (Signed)

## 2022-12-23 LAB — SURGICAL PATHOLOGY

## 2022-12-26 ENCOUNTER — Encounter (HOSPITAL_COMMUNITY): Payer: Self-pay | Admitting: Gastroenterology

## 2022-12-28 DIAGNOSIS — G894 Chronic pain syndrome: Secondary | ICD-10-CM | POA: Diagnosis not present

## 2022-12-28 DIAGNOSIS — M4802 Spinal stenosis, cervical region: Secondary | ICD-10-CM | POA: Diagnosis not present

## 2022-12-28 DIAGNOSIS — M1991 Primary osteoarthritis, unspecified site: Secondary | ICD-10-CM | POA: Diagnosis not present

## 2022-12-28 DIAGNOSIS — M48061 Spinal stenosis, lumbar region without neurogenic claudication: Secondary | ICD-10-CM | POA: Diagnosis not present

## 2023-01-18 DIAGNOSIS — S93401A Sprain of unspecified ligament of right ankle, initial encounter: Secondary | ICD-10-CM | POA: Diagnosis not present

## 2023-01-24 DIAGNOSIS — S93401A Sprain of unspecified ligament of right ankle, initial encounter: Secondary | ICD-10-CM | POA: Diagnosis not present

## 2023-01-24 DIAGNOSIS — S92211A Displaced fracture of cuboid bone of right foot, initial encounter for closed fracture: Secondary | ICD-10-CM | POA: Diagnosis not present

## 2023-02-04 DIAGNOSIS — S92214D Nondisplaced fracture of cuboid bone of right foot, subsequent encounter for fracture with routine healing: Secondary | ICD-10-CM | POA: Diagnosis not present

## 2023-02-04 DIAGNOSIS — F1120 Opioid dependence, uncomplicated: Secondary | ICD-10-CM | POA: Diagnosis not present

## 2023-02-04 DIAGNOSIS — M8000XD Age-related osteoporosis with current pathological fracture, unspecified site, subsequent encounter for fracture with routine healing: Secondary | ICD-10-CM | POA: Diagnosis not present

## 2023-02-04 DIAGNOSIS — E663 Overweight: Secondary | ICD-10-CM | POA: Diagnosis not present

## 2023-02-04 DIAGNOSIS — M4802 Spinal stenosis, cervical region: Secondary | ICD-10-CM | POA: Diagnosis not present

## 2023-02-04 DIAGNOSIS — M48061 Spinal stenosis, lumbar region without neurogenic claudication: Secondary | ICD-10-CM | POA: Diagnosis not present

## 2023-02-04 DIAGNOSIS — Z6828 Body mass index (BMI) 28.0-28.9, adult: Secondary | ICD-10-CM | POA: Diagnosis not present

## 2023-02-17 DIAGNOSIS — S92211A Displaced fracture of cuboid bone of right foot, initial encounter for closed fracture: Secondary | ICD-10-CM | POA: Diagnosis not present

## 2023-02-17 DIAGNOSIS — S93401A Sprain of unspecified ligament of right ankle, initial encounter: Secondary | ICD-10-CM | POA: Diagnosis not present

## 2023-02-23 ENCOUNTER — Other Ambulatory Visit (HOSPITAL_BASED_OUTPATIENT_CLINIC_OR_DEPARTMENT_OTHER): Payer: Self-pay | Admitting: Family

## 2023-02-23 DIAGNOSIS — I700 Atherosclerosis of aorta: Secondary | ICD-10-CM

## 2023-02-23 DIAGNOSIS — E785 Hyperlipidemia, unspecified: Secondary | ICD-10-CM

## 2023-02-23 DIAGNOSIS — Z8673 Personal history of transient ischemic attack (TIA), and cerebral infarction without residual deficits: Secondary | ICD-10-CM

## 2023-02-24 NOTE — Telephone Encounter (Signed)
Rx request sent to pharmacy.  

## 2023-03-02 DIAGNOSIS — S92001A Unspecified fracture of right calcaneus, initial encounter for closed fracture: Secondary | ICD-10-CM | POA: Insufficient documentation

## 2023-03-02 DIAGNOSIS — S93401A Sprain of unspecified ligament of right ankle, initial encounter: Secondary | ICD-10-CM | POA: Diagnosis not present

## 2023-03-02 DIAGNOSIS — S92211A Displaced fracture of cuboid bone of right foot, initial encounter for closed fracture: Secondary | ICD-10-CM | POA: Diagnosis not present

## 2023-03-02 DIAGNOSIS — S92024A Nondisplaced fracture of anterior process of right calcaneus, initial encounter for closed fracture: Secondary | ICD-10-CM | POA: Diagnosis not present

## 2023-03-04 DIAGNOSIS — F1120 Opioid dependence, uncomplicated: Secondary | ICD-10-CM | POA: Diagnosis not present

## 2023-03-04 DIAGNOSIS — I10 Essential (primary) hypertension: Secondary | ICD-10-CM | POA: Diagnosis not present

## 2023-03-04 DIAGNOSIS — M1991 Primary osteoarthritis, unspecified site: Secondary | ICD-10-CM | POA: Diagnosis not present

## 2023-03-04 DIAGNOSIS — G894 Chronic pain syndrome: Secondary | ICD-10-CM | POA: Diagnosis not present

## 2023-03-04 DIAGNOSIS — M4802 Spinal stenosis, cervical region: Secondary | ICD-10-CM | POA: Diagnosis not present

## 2023-03-04 DIAGNOSIS — J209 Acute bronchitis, unspecified: Secondary | ICD-10-CM | POA: Diagnosis not present

## 2023-03-14 ENCOUNTER — Other Ambulatory Visit: Payer: Self-pay | Admitting: Internal Medicine

## 2023-03-14 ENCOUNTER — Ambulatory Visit
Admission: RE | Admit: 2023-03-14 | Discharge: 2023-03-14 | Disposition: A | Discharge status: Home / Self Care | Payer: Medicare HMO | Source: Ambulatory Visit | Attending: Internal Medicine | Admitting: Internal Medicine

## 2023-03-14 DIAGNOSIS — Z1231 Encounter for screening mammogram for malignant neoplasm of breast: Secondary | ICD-10-CM

## 2023-04-04 DIAGNOSIS — M8000XD Age-related osteoporosis with current pathological fracture, unspecified site, subsequent encounter for fracture with routine healing: Secondary | ICD-10-CM | POA: Diagnosis not present

## 2023-04-04 DIAGNOSIS — G894 Chronic pain syndrome: Secondary | ICD-10-CM | POA: Diagnosis not present

## 2023-04-04 DIAGNOSIS — B070 Plantar wart: Secondary | ICD-10-CM | POA: Diagnosis not present

## 2023-04-04 DIAGNOSIS — H9113 Presbycusis, bilateral: Secondary | ICD-10-CM | POA: Diagnosis not present

## 2023-04-04 DIAGNOSIS — M48061 Spinal stenosis, lumbar region without neurogenic claudication: Secondary | ICD-10-CM | POA: Diagnosis not present

## 2023-04-15 DIAGNOSIS — F1120 Opioid dependence, uncomplicated: Secondary | ICD-10-CM | POA: Diagnosis not present

## 2023-04-15 DIAGNOSIS — E6609 Other obesity due to excess calories: Secondary | ICD-10-CM | POA: Diagnosis not present

## 2023-04-15 DIAGNOSIS — Z6829 Body mass index (BMI) 29.0-29.9, adult: Secondary | ICD-10-CM | POA: Diagnosis not present

## 2023-04-15 DIAGNOSIS — G894 Chronic pain syndrome: Secondary | ICD-10-CM | POA: Diagnosis not present

## 2023-04-15 DIAGNOSIS — M4802 Spinal stenosis, cervical region: Secondary | ICD-10-CM | POA: Diagnosis not present

## 2023-04-15 DIAGNOSIS — E039 Hypothyroidism, unspecified: Secondary | ICD-10-CM | POA: Diagnosis not present

## 2023-04-15 DIAGNOSIS — E271 Primary adrenocortical insufficiency: Secondary | ICD-10-CM | POA: Diagnosis not present

## 2023-04-15 DIAGNOSIS — Z1331 Encounter for screening for depression: Secondary | ICD-10-CM | POA: Diagnosis not present

## 2023-04-15 DIAGNOSIS — Z0001 Encounter for general adult medical examination with abnormal findings: Secondary | ICD-10-CM | POA: Diagnosis not present

## 2023-04-15 DIAGNOSIS — M1991 Primary osteoarthritis, unspecified site: Secondary | ICD-10-CM | POA: Diagnosis not present

## 2023-04-15 DIAGNOSIS — M8000XD Age-related osteoporosis with current pathological fracture, unspecified site, subsequent encounter for fracture with routine healing: Secondary | ICD-10-CM | POA: Diagnosis not present

## 2023-04-15 DIAGNOSIS — E559 Vitamin D deficiency, unspecified: Secondary | ICD-10-CM | POA: Diagnosis not present

## 2023-04-15 DIAGNOSIS — I10 Essential (primary) hypertension: Secondary | ICD-10-CM | POA: Diagnosis not present

## 2023-04-15 DIAGNOSIS — D518 Other vitamin B12 deficiency anemias: Secondary | ICD-10-CM | POA: Diagnosis not present

## 2023-04-15 DIAGNOSIS — M48061 Spinal stenosis, lumbar region without neurogenic claudication: Secondary | ICD-10-CM | POA: Diagnosis not present

## 2023-04-20 DIAGNOSIS — J988 Other specified respiratory disorders: Secondary | ICD-10-CM | POA: Diagnosis not present

## 2023-04-20 DIAGNOSIS — J329 Chronic sinusitis, unspecified: Secondary | ICD-10-CM | POA: Diagnosis not present

## 2023-04-20 DIAGNOSIS — B9689 Other specified bacterial agents as the cause of diseases classified elsewhere: Secondary | ICD-10-CM | POA: Diagnosis not present

## 2023-04-20 DIAGNOSIS — R051 Acute cough: Secondary | ICD-10-CM | POA: Diagnosis not present

## 2023-05-06 DIAGNOSIS — I10 Essential (primary) hypertension: Secondary | ICD-10-CM | POA: Diagnosis not present

## 2023-05-06 DIAGNOSIS — Z0001 Encounter for general adult medical examination with abnormal findings: Secondary | ICD-10-CM | POA: Diagnosis not present

## 2023-05-21 ENCOUNTER — Other Ambulatory Visit (HOSPITAL_BASED_OUTPATIENT_CLINIC_OR_DEPARTMENT_OTHER): Payer: Self-pay | Admitting: Family

## 2023-05-21 DIAGNOSIS — I700 Atherosclerosis of aorta: Secondary | ICD-10-CM

## 2023-05-21 DIAGNOSIS — I25118 Atherosclerotic heart disease of native coronary artery with other forms of angina pectoris: Secondary | ICD-10-CM

## 2023-05-21 DIAGNOSIS — I10 Essential (primary) hypertension: Secondary | ICD-10-CM

## 2023-06-02 DIAGNOSIS — M48061 Spinal stenosis, lumbar region without neurogenic claudication: Secondary | ICD-10-CM | POA: Diagnosis not present

## 2023-06-02 DIAGNOSIS — M8000XD Age-related osteoporosis with current pathological fracture, unspecified site, subsequent encounter for fracture with routine healing: Secondary | ICD-10-CM | POA: Diagnosis not present

## 2023-06-02 DIAGNOSIS — F1120 Opioid dependence, uncomplicated: Secondary | ICD-10-CM | POA: Diagnosis not present

## 2023-06-14 DIAGNOSIS — H9202 Otalgia, left ear: Secondary | ICD-10-CM | POA: Diagnosis not present

## 2023-06-14 DIAGNOSIS — H7292 Unspecified perforation of tympanic membrane, left ear: Secondary | ICD-10-CM | POA: Diagnosis not present

## 2023-06-14 DIAGNOSIS — L089 Local infection of the skin and subcutaneous tissue, unspecified: Secondary | ICD-10-CM | POA: Diagnosis not present

## 2023-06-14 DIAGNOSIS — S50811A Abrasion of right forearm, initial encounter: Secondary | ICD-10-CM | POA: Diagnosis not present

## 2023-06-23 ENCOUNTER — Other Ambulatory Visit (HOSPITAL_BASED_OUTPATIENT_CLINIC_OR_DEPARTMENT_OTHER): Payer: Self-pay | Admitting: Family

## 2023-06-23 DIAGNOSIS — I10 Essential (primary) hypertension: Secondary | ICD-10-CM

## 2023-06-23 DIAGNOSIS — I700 Atherosclerosis of aorta: Secondary | ICD-10-CM

## 2023-06-23 DIAGNOSIS — I25118 Atherosclerotic heart disease of native coronary artery with other forms of angina pectoris: Secondary | ICD-10-CM

## 2023-06-23 DIAGNOSIS — M353 Polymyalgia rheumatica: Secondary | ICD-10-CM | POA: Diagnosis not present

## 2023-07-05 DIAGNOSIS — M1991 Primary osteoarthritis, unspecified site: Secondary | ICD-10-CM | POA: Diagnosis not present

## 2023-07-05 DIAGNOSIS — Z6829 Body mass index (BMI) 29.0-29.9, adult: Secondary | ICD-10-CM | POA: Diagnosis not present

## 2023-07-05 DIAGNOSIS — F419 Anxiety disorder, unspecified: Secondary | ICD-10-CM | POA: Diagnosis not present

## 2023-07-05 DIAGNOSIS — E271 Primary adrenocortical insufficiency: Secondary | ICD-10-CM | POA: Diagnosis not present

## 2023-07-05 DIAGNOSIS — F1120 Opioid dependence, uncomplicated: Secondary | ICD-10-CM | POA: Diagnosis not present

## 2023-07-05 DIAGNOSIS — M8000XD Age-related osteoporosis with current pathological fracture, unspecified site, subsequent encounter for fracture with routine healing: Secondary | ICD-10-CM | POA: Diagnosis not present

## 2023-07-05 DIAGNOSIS — L080 Pyoderma: Secondary | ICD-10-CM | POA: Diagnosis not present

## 2023-07-05 DIAGNOSIS — M48061 Spinal stenosis, lumbar region without neurogenic claudication: Secondary | ICD-10-CM | POA: Diagnosis not present

## 2023-07-05 DIAGNOSIS — G894 Chronic pain syndrome: Secondary | ICD-10-CM | POA: Diagnosis not present

## 2023-07-05 DIAGNOSIS — I10 Essential (primary) hypertension: Secondary | ICD-10-CM | POA: Diagnosis not present

## 2023-07-05 DIAGNOSIS — E6609 Other obesity due to excess calories: Secondary | ICD-10-CM | POA: Diagnosis not present

## 2023-07-07 DIAGNOSIS — L080 Pyoderma: Secondary | ICD-10-CM | POA: Diagnosis not present

## 2023-07-07 DIAGNOSIS — R197 Diarrhea, unspecified: Secondary | ICD-10-CM | POA: Diagnosis not present

## 2023-07-09 NOTE — Progress Notes (Deleted)
  Cardiology Office Note:  .   Date:  07/09/2023  ID:  [name], DOB 1966/03/28, MRN [id number] PCP: Elfredia Nevins, MD  [facility] Providers Cardiologist:  Jodelle Red, MD  History of Present Illness: .   [name] is a 57 y.o. female with a past medical history of CVA, hypertension, nonobstructive CAD, hyperlipidemia, history of tobacco use, Addison's disease.  Patient is followed by Dr. Cristal Deer and presents today for an annual follow-up appointment.  Patient previously underwent cardiac catheterization in 2013 that showed no significant coronary artery disease.  Underwent echocardiogram in 2016 with normal EF, grade 1 diastolic dysfunction.  Nuclear stress test in 01/2020 was a low risk study with breast attenuation, no ischemia.  Echocardiogram in 10/2020 showed EF 55-60%, grade 1 DD, normal RV function, no significant valvular disease.  Patient had developed lower extremity edema and was started on Lasix.  Of note, she was on steroids at that time due to Addisons Disease. Had CVA in 01/2021.  Echocardiogram in 01/2021 showed EF 55-60%, no wall motion abnormalities, mild LVH, normal RV function, no significant valvular abnormalities.  In 01/2022, patient complained of chest pain.  Underwent coronary CTA 03/25/2022 that showed a coronary calcium score of 337 (98th percentile), minimal atherosclerosis.  Patient assessed by cardiology in 05/03/2022.  At that time, patient was doing well from a cardiac perspective.  Remained on aspirin, atorvastatin, carvedilol.  Elevated Coronary Calcium Score  -Patient underwent coronary CTA on 03/25/2022 that showed coronary calcium score of 337 (98th percentile), minimal atherosclerosis -Patient denies chest pain, DOE -Continue aspirin 81 mg daily***, lipitor 40 mg daily   Aortic Atherosclerosis  HLD  - Goal LDL <70  - LDL was 54 in 01/2021  - Ordered lipid panel and LFTs  - Continue lipitor 40 mg daily   HTN  -BP  well-controlled -Continue carvedilol 6.25 mg twice daily  Tobacco Use   History of CVA  -Follows with neurology -Continue aspirin, Lipitor  Addison's Disease   ROS: ***  Studies Reviewed: .        *** Risk Assessment/Calculations:   {Does this patient have ATRIAL FIBRILLATION?:[phone]} No BP recorded.  {Refresh Note OR Click here to enter BP  :1}***       Physical Exam:   VS:  There were no vitals taken for this visit.   Wt Readings from Last 3 Encounters:  12/22/22 180 lb (81.6 kg)  05/03/22 181 lb 8 oz (82.3 kg)  03/15/22 170 lb (77.1 kg)    GEN: Well nourished, well developed in no acute distress NECK: No JVD; No carotid bruits CARDIAC: ***RRR, no murmurs, rubs, gallops RESPIRATORY:  Clear to auscultation without rales, wheezing or rhonchi  ABDOMEN: Soft, non-tender, non-distended EXTREMITIES:  No edema; No deformity   ASSESSMENT AND PLAN: .   ***    {Are you ordering a CV Procedure (e.g. stress test, cath, DCCV, TEE, etc)?   Press F2        :[id number]}  Dispo: ***  Signed, Jonita Albee, PA-C

## 2023-07-13 DIAGNOSIS — H524 Presbyopia: Secondary | ICD-10-CM | POA: Diagnosis not present

## 2023-07-13 DIAGNOSIS — H2513 Age-related nuclear cataract, bilateral: Secondary | ICD-10-CM | POA: Diagnosis not present

## 2023-07-13 DIAGNOSIS — H5203 Hypermetropia, bilateral: Secondary | ICD-10-CM | POA: Diagnosis not present

## 2023-07-13 DIAGNOSIS — H52223 Regular astigmatism, bilateral: Secondary | ICD-10-CM | POA: Diagnosis not present

## 2023-07-14 ENCOUNTER — Ambulatory Visit: Payer: Medicare HMO | Admitting: Cardiology

## 2023-07-14 DIAGNOSIS — I10 Essential (primary) hypertension: Secondary | ICD-10-CM | POA: Diagnosis not present

## 2023-07-14 DIAGNOSIS — Z880 Allergy status to penicillin: Secondary | ICD-10-CM | POA: Diagnosis not present

## 2023-07-14 DIAGNOSIS — Z882 Allergy status to sulfonamides status: Secondary | ICD-10-CM | POA: Diagnosis not present

## 2023-07-14 DIAGNOSIS — M545 Low back pain, unspecified: Secondary | ICD-10-CM | POA: Diagnosis not present

## 2023-07-14 DIAGNOSIS — L989 Disorder of the skin and subcutaneous tissue, unspecified: Secondary | ICD-10-CM | POA: Diagnosis not present

## 2023-07-14 DIAGNOSIS — E271 Primary adrenocortical insufficiency: Secondary | ICD-10-CM | POA: Diagnosis not present

## 2023-07-14 DIAGNOSIS — Z881 Allergy status to other antibiotic agents status: Secondary | ICD-10-CM | POA: Diagnosis not present

## 2023-07-14 DIAGNOSIS — I700 Atherosclerosis of aorta: Secondary | ICD-10-CM

## 2023-07-14 DIAGNOSIS — E785 Hyperlipidemia, unspecified: Secondary | ICD-10-CM

## 2023-07-14 DIAGNOSIS — Z8673 Personal history of transient ischemic attack (TIA), and cerebral infarction without residual deficits: Secondary | ICD-10-CM

## 2023-07-14 DIAGNOSIS — L299 Pruritus, unspecified: Secondary | ICD-10-CM | POA: Diagnosis not present

## 2023-07-14 DIAGNOSIS — Z885 Allergy status to narcotic agent status: Secondary | ICD-10-CM | POA: Diagnosis not present

## 2023-07-14 DIAGNOSIS — R931 Abnormal findings on diagnostic imaging of heart and coronary circulation: Secondary | ICD-10-CM

## 2023-07-14 DIAGNOSIS — Z79890 Hormone replacement therapy (postmenopausal): Secondary | ICD-10-CM | POA: Diagnosis not present

## 2023-07-14 DIAGNOSIS — Z87891 Personal history of nicotine dependence: Secondary | ICD-10-CM | POA: Diagnosis not present

## 2023-07-20 DIAGNOSIS — L308 Other specified dermatitis: Secondary | ICD-10-CM | POA: Diagnosis not present

## 2023-07-20 DIAGNOSIS — D518 Other vitamin B12 deficiency anemias: Secondary | ICD-10-CM | POA: Diagnosis not present

## 2023-07-20 DIAGNOSIS — L281 Prurigo nodularis: Secondary | ICD-10-CM | POA: Diagnosis not present

## 2023-07-28 DIAGNOSIS — F5101 Primary insomnia: Secondary | ICD-10-CM | POA: Diagnosis not present

## 2023-07-28 DIAGNOSIS — M48061 Spinal stenosis, lumbar region without neurogenic claudication: Secondary | ICD-10-CM | POA: Diagnosis not present

## 2023-07-28 DIAGNOSIS — G894 Chronic pain syndrome: Secondary | ICD-10-CM | POA: Diagnosis not present

## 2023-07-28 DIAGNOSIS — L209 Atopic dermatitis, unspecified: Secondary | ICD-10-CM | POA: Diagnosis not present

## 2023-07-29 ENCOUNTER — Other Ambulatory Visit (HOSPITAL_BASED_OUTPATIENT_CLINIC_OR_DEPARTMENT_OTHER): Payer: Self-pay | Admitting: Cardiology

## 2023-07-29 DIAGNOSIS — I700 Atherosclerosis of aorta: Secondary | ICD-10-CM

## 2023-07-29 DIAGNOSIS — Z8673 Personal history of transient ischemic attack (TIA), and cerebral infarction without residual deficits: Secondary | ICD-10-CM

## 2023-07-29 DIAGNOSIS — E785 Hyperlipidemia, unspecified: Secondary | ICD-10-CM

## 2023-08-07 NOTE — Progress Notes (Unsigned)
Cardiology Office Note:  .   Date:  08/08/2023  ID:  [name], DOB 1966-06-09, MRN [id number] PCP: Elfredia Nevins, MD  [facility] Providers Cardiologist: Jodelle Red, MD    }   History of Present Illness: .   [name] is a 57 y.o. female with a hx of CVA (unimpressive punctate acute or subacute infarction) she is now on Plavix, hypertension, nonobstructive CAD, hyperlipidemia, tobacco abuse, Addison's disease. Echo 10/2020 EF 55 to 60%, grade 1 diastolic dysfunction, normal RV function, no significant valvular disease, normal wall motion.   She was started on Lasix due to lower extremity edema.  She cares for her daughter who has paraplegia.  Last seen in the office by Gillian Shields, NP on 05/03/2022.  She comes today with multiple complaints.  She has been having chest pain, shortness of breath, she has been diagnosed with anemia, hypercalcemia, kidney disease, has been recently placed on doxycycline and has been coughing up green phlegm, she is has generalized fatigue.  Since starting on the doxycycline she has started to feel some better although she is not felt well in the long time.  She also has an inhaler which she has been some help.  She does not have a lot of energy.  She normally has lower extremity edema but has not had any over the last few days that she states she is resting a lot and her feet up.  She cannot tolerate high doses of Lasix, and currently is not on any diuretics.  ROS: As above otherwise negative.  Studies Reviewed: .   Cardiac CTA 03/2022 IMPRESSION: 1. Coronary calcium score of 337. This was 98th percentile for age-, sex, and race-matched controls.   2.  Normal coronary origin with right dominance.   3.  Minimal atherosclerosis.  CAD RADS 1.   4.  Consider non atherosclerotic causes of chest pain.   5.  Recommend preventive therapy and risk factor modification.  EKG Interpretation Date/Time:  Monday August 08 2023 15:33:57  EST Ventricular Rate:  58 PR Interval:  124 QRS Duration:  92 QT Interval:  430 QTC Calculation: 422 R Axis:   63  Text Interpretation: Sinus bradycardia When compared with ECG of 15-Mar-2022 10:49, PREVIOUS ECG IS PRESENT Confirmed by Joni Reining [phone]) on 08/08/2023 3:47:56 PM    Physical Exam:   VS:  BP (!) 158/86 (BP Location: Left Arm, Patient Position: Sitting, Cuff Size: Normal)   Pulse (!) 58   Ht 5\' 5"  (1.651 m)   Wt 173 lb 12.8 oz (78.8 kg)   SpO2 99%   BMI 28.92 kg/m      Wt Readings from Last 3 Encounters:  08/08/23 173 lb 12.8 oz (78.8 kg)  12/22/22 180 lb (81.6 kg)  05/03/22 181 lb 8 oz (82.3 kg)    GEN: Well nourished, well developed in no acute distress NECK: No JVD; No carotid bruits CARDIAC: RRR, bradycardic, no murmurs, rubs, gallops RESPIRATORY:  Clear to auscultation without rales, wheezing or rhonchi, no cough ABDOMEN: Soft, non-tender, non-distended EXTREMITIES:  No edema; No deformity   ASSESSMENT AND PLAN: .    Chest pain: I have given her reassurance that her most recent cardiac evaluation with cardiac CTA, revealed nonobstructive CAD.  It is doubtful that her chest discomfort is related to ischemia.  I believe this is more related to her chronic coughing from lung infection.  This should subside once she is not coughing as much.   2.  Dyspnea  on exertion: Multifactorial.  Will check echocardiogram for changes in LV function.  No additions or subtractions to medications at this time.  3.  History of acute CVA: Punctuate subacute infarction.  She has lost her neurologist, who was Dr. Gerilyn Pilgrim in [place].  She will need to speak with primary care for referral to new neurologist.  4.  Hypertension: Blood pressure is elevated today in the office.  I have asked her to take her blood pressures at home and record them at the same time each day after taking her medications.  She is given refills on carvedilol 6.25 mg twice daily.  5.  Chronic  kidney disease: Followed by PCP.  She would like to be referred to nephrology.  I have asked her to talk with her PCP about this referral.  She is no longer on any diuretics.         Signed, Bettey Mare. Liborio Nixon, ANP, AACC

## 2023-08-08 ENCOUNTER — Encounter: Payer: Self-pay | Admitting: Adult Health

## 2023-08-08 ENCOUNTER — Ambulatory Visit: Payer: Medicare HMO | Attending: Adult Health | Admitting: Adult Health

## 2023-08-08 VITALS — BP 158/86 | HR 58 | Ht 65.0 in | Wt 173.8 lb

## 2023-08-08 DIAGNOSIS — I10 Essential (primary) hypertension: Secondary | ICD-10-CM | POA: Diagnosis not present

## 2023-08-08 NOTE — Patient Instructions (Signed)
Medication Instructions:   No Changes *If you need a refill on your cardiac medications before your next appointment, please call your pharmacy*   Lab Work: No Labs If you have labs (blood work) drawn today and your tests are completely normal, you will receive your results only by: MyChart Message (if you have MyChart) OR A paper copy in the mail If you have any lab test that is abnormal or we need to change your treatment, we will call you to review the results.   Testing/Procedures: [address], [address]. Your physician has requested that you have an echocardiogram. Echocardiography is a painless test that uses sound waves to create images of your heart. It provides your doctor with information about the size and shape of your heart and how well your heart's chambers and valves are working. This procedure takes approximately one hour. There are no restrictions for this procedure. Please do NOT wear cologne, perfume, aftershave, or lotions (deodorant is allowed). Please arrive 15 minutes prior to your appointment time.  Please note: We ask at that you not bring children with you during ultrasound (echo/ vascular) testing. Due to room size and safety concerns, children are not allowed in the ultrasound rooms during exams. Our front office staff cannot provide observation of children in our lobby area while testing is being conducted. An adult accompanying a patient to their appointment will only be allowed in the ultrasound room at the discretion of the ultrasound technician under special circumstances. We apologize for any inconvenience.    Follow-Up: At [facility], you and your health needs are our priority.  As part of our continuing mission to provide you with exceptional heart care, we have created designated Provider Care Teams.  These Care Teams include your primary Cardiologist (physician) and Advanced Practice Providers (APPs -  Physician Assistants and Nurse  Practitioners) who all work together to provide you with the care you need, when you need it.  We recommend signing up for the patient portal called "MyChart".  Sign up information is provided on this After Visit Summary.  MyChart is used to connect with patients for Virtual Visits (Telemedicine).  Patients are able to view lab/test results, encounter notes, upcoming appointments, etc.  Non-urgent messages can be sent to your provider as well.   To learn more about what you can do with MyChart, go to [email or web address].    Your next appointment:   4-6 month(s)  Provider:   Jodelle Red, MD

## 2023-08-09 DIAGNOSIS — K2960 Other gastritis without bleeding: Secondary | ICD-10-CM | POA: Diagnosis not present

## 2023-08-09 DIAGNOSIS — K581 Irritable bowel syndrome with constipation: Secondary | ICD-10-CM | POA: Diagnosis not present

## 2023-08-16 DIAGNOSIS — L281 Prurigo nodularis: Secondary | ICD-10-CM | POA: Diagnosis not present

## 2023-08-16 DIAGNOSIS — L2089 Other atopic dermatitis: Secondary | ICD-10-CM | POA: Diagnosis not present

## 2023-08-16 DIAGNOSIS — F408 Other phobic anxiety disorders: Secondary | ICD-10-CM | POA: Diagnosis not present

## 2023-09-02 ENCOUNTER — Other Ambulatory Visit (HOSPITAL_BASED_OUTPATIENT_CLINIC_OR_DEPARTMENT_OTHER): Payer: Self-pay | Admitting: Family

## 2023-09-02 DIAGNOSIS — I10 Essential (primary) hypertension: Secondary | ICD-10-CM

## 2023-09-02 DIAGNOSIS — M48061 Spinal stenosis, lumbar region without neurogenic claudication: Secondary | ICD-10-CM | POA: Diagnosis not present

## 2023-09-02 DIAGNOSIS — F1120 Opioid dependence, uncomplicated: Secondary | ICD-10-CM | POA: Diagnosis not present

## 2023-09-02 DIAGNOSIS — I25118 Atherosclerotic heart disease of native coronary artery with other forms of angina pectoris: Secondary | ICD-10-CM

## 2023-09-02 DIAGNOSIS — L209 Atopic dermatitis, unspecified: Secondary | ICD-10-CM | POA: Diagnosis not present

## 2023-09-02 DIAGNOSIS — I700 Atherosclerosis of aorta: Secondary | ICD-10-CM

## 2023-09-02 DIAGNOSIS — E271 Primary adrenocortical insufficiency: Secondary | ICD-10-CM | POA: Diagnosis not present

## 2023-09-02 NOTE — Telephone Encounter (Signed)
Vernona Rieger from [facility] called to confirm refill request was received.   Advised it was received and the refill team will send it ASAP.   Adelina Mings verbalized understanding.

## 2023-09-08 ENCOUNTER — Ambulatory Visit (HOSPITAL_COMMUNITY)
Admission: RE | Admit: 2023-09-08 | Discharge: 2023-09-08 | Disposition: A | Discharge status: Home / Self Care | Payer: Medicare HMO | Source: Ambulatory Visit | Attending: Adult Health | Admitting: Adult Health

## 2023-09-08 ENCOUNTER — Telehealth: Payer: Self-pay

## 2023-09-08 DIAGNOSIS — I10 Essential (primary) hypertension: Secondary | ICD-10-CM | POA: Diagnosis not present

## 2023-09-08 LAB — ECHOCARDIOGRAM COMPLETE
AR max vel: 1.47 cm2
AV Area VTI: 1.55 cm2
AV Area mean vel: 1.43 cm2
AV Mean grad: 5 mm[Hg]
AV Peak grad: 8.5 mm[Hg]
Ao pk vel: 1.46 m/s
Area-P 1/2: 2.62 cm2
MV M vel: 4.35 m/s
MV Peak grad: 75.7 mm[Hg]
S' Lateral: 3.78 cm

## 2023-09-08 NOTE — Telephone Encounter (Addendum)
 Called patient regarding results. Patient had understanding of results.----- Message from Lamarr Satterfield sent at 09/08/2023 12:20 PM EST ----- I have reviewed her echocardiogram.  She has normal heart pumping function.  No evidence of stiffening on relaxation.  She does have some mild mitral valve regurgitation (backflow through the valve that separates the left atrium from the left ventricle).  There is mild calcium  on the aortic valve, not worrisome at this time.  She will need to have a follow-up echocardiogram in 2 years unless she is symptomatic.

## 2023-09-09 ENCOUNTER — Encounter (INDEPENDENT_AMBULATORY_CARE_PROVIDER_SITE_OTHER): Payer: Self-pay | Admitting: Otolaryngology

## 2023-09-15 ENCOUNTER — Other Ambulatory Visit: Payer: Self-pay

## 2023-09-15 ENCOUNTER — Encounter (HOSPITAL_COMMUNITY): Payer: Self-pay | Admitting: *Deleted

## 2023-09-15 ENCOUNTER — Emergency Department (HOSPITAL_COMMUNITY)
Admission: EM | Admit: 2023-09-15 | Discharge: 2023-09-15 | Disposition: A | Discharge status: Home / Self Care | Payer: Medicare HMO | Attending: Emergency Medicine | Admitting: Emergency Medicine

## 2023-09-15 DIAGNOSIS — R21 Rash and other nonspecific skin eruption: Secondary | ICD-10-CM | POA: Insufficient documentation

## 2023-09-15 DIAGNOSIS — Z7902 Long term (current) use of antithrombotics/antiplatelets: Secondary | ICD-10-CM | POA: Diagnosis not present

## 2023-09-15 DIAGNOSIS — E039 Hypothyroidism, unspecified: Secondary | ICD-10-CM | POA: Insufficient documentation

## 2023-09-15 DIAGNOSIS — I10 Essential (primary) hypertension: Secondary | ICD-10-CM | POA: Diagnosis not present

## 2023-09-15 DIAGNOSIS — Z79899 Other long term (current) drug therapy: Secondary | ICD-10-CM | POA: Diagnosis not present

## 2023-09-15 DIAGNOSIS — L299 Pruritus, unspecified: Secondary | ICD-10-CM | POA: Diagnosis not present

## 2023-09-15 DIAGNOSIS — Z743 Need for continuous supervision: Secondary | ICD-10-CM | POA: Diagnosis not present

## 2023-09-15 LAB — CBC WITH DIFFERENTIAL/PLATELET
Abs Immature Granulocytes: 0.03 10*3/uL (ref 0.00–0.07)
Basophils Absolute: 0.1 10*3/uL (ref 0.0–0.1)
Basophils Relative: 1 %
Eosinophils Absolute: 0.2 10*3/uL (ref 0.0–0.5)
Eosinophils Relative: 2 %
HCT: 41.3 % (ref 36.0–46.0)
Hemoglobin: 13.1 g/dL (ref 12.0–15.0)
Immature Granulocytes: 0 %
Lymphocytes Relative: 31 %
Lymphs Abs: 2.9 10*3/uL (ref 0.7–4.0)
MCH: 27.9 pg (ref 26.0–34.0)
MCHC: 31.7 g/dL (ref 30.0–36.0)
MCV: 88.1 fL (ref 80.0–100.0)
Monocytes Absolute: 0.9 10*3/uL (ref 0.1–1.0)
Monocytes Relative: 9 %
Neutro Abs: 5.5 10*3/uL (ref 1.7–7.7)
Neutrophils Relative %: 57 %
Platelets: 308 10*3/uL (ref 150–400)
RBC: 4.69 MIL/uL (ref 3.87–5.11)
RDW: 16.1 % — ABNORMAL HIGH (ref 11.5–15.5)
WBC: 9.6 10*3/uL (ref 4.0–10.5)
nRBC: 0 % (ref 0.0–0.2)

## 2023-09-15 LAB — COMPREHENSIVE METABOLIC PANEL
ALT: 19 U/L (ref 0–44)
AST: 30 U/L (ref 15–41)
Albumin: 4.1 g/dL (ref 3.5–5.0)
Alkaline Phosphatase: 61 U/L (ref 38–126)
Anion gap: 11 (ref 5–15)
BUN: 19 mg/dL (ref 6–20)
CO2: 21 mmol/L — ABNORMAL LOW (ref 22–32)
Calcium: 9.4 mg/dL (ref 8.9–10.3)
Chloride: 105 mmol/L (ref 98–111)
Creatinine, Ser: 1.33 mg/dL — ABNORMAL HIGH (ref 0.44–1.00)
GFR, Estimated: 46 mL/min — ABNORMAL LOW (ref >60)
Glucose, Bld: 76 mg/dL (ref 70–99)
Potassium: 3.8 mmol/L (ref 3.5–5.1)
Sodium: 137 mmol/L (ref 135–145)
Total Bilirubin: 0.4 mg/dL (ref 0.0–1.2)
Total Protein: 7.7 g/dL (ref 6.5–8.1)

## 2023-09-15 LAB — SEDIMENTATION RATE: Sed Rate: 12 mm/h (ref 0–22)

## 2023-09-15 MED ORDER — DIPHENHYDRAMINE HCL 50 MG/ML IJ SOLN
25.0000 mg | Freq: Once | INTRAMUSCULAR | Status: AC
  Administered 2023-09-15: 25 mg via INTRAVENOUS
  Filled 2023-09-15: qty 1

## 2023-09-15 NOTE — ED Provider Notes (Signed)
 [place] EMERGENCY DEPARTMENT AT [facility] Provider Note   CSN: [id number] Arrival date & time: 09/15/23  1725     History  Chief Complaint  Patient presents with   Rash    [name] is a 58 y.o. female.  58 year old female with history of Addison's disease on hydrocortisone , hypothyroidism, hypertension, stroke on Plavix , and seizures on topiramate  who presents emergency department with rash.  Says that in October she had a small bug bite on her leg and they thought it might be a parasite so she was placed on an antibiotic that she cannot remember.  Says that around Christmas she noticed a bullae on her forehead that ruptured.  Had been doing well until the past few days when it started spreading down her face and then this morning she also noticed that her neck had a rash on it that started to desquamate.  Also has a sensation behind her ears and in her ears and in her nose that the rash is forming.  Says that there sometimes is a strange film on her dentures but is unsure if it is in her mouth.  Not on her palms or soles.  Itches and is very painful.  Has been trying Benadryl  for it.       Home Medications Prior to Admission medications   Medication Sig Start Date End Date Taking? Authorizing Provider  albuterol  (VENTOLIN  HFA) 108 (90 Base) MCG/ACT inhaler Inhale 1-2 puffs into the lungs every 6 (six) hours as needed for wheezing or shortness of breath.  10/22/19   [provider]  alendronate (FOSAMAX) 70 MG tablet Take 70 mg by mouth once a week. Take with a full glass of water on an empty stomach On Mondays    [provider]  ALPRAZolam  (XANAX ) 0.5 MG tablet Take 0.5 mg by mouth 3 (three) times daily as needed for anxiety.    [provider]  atorvastatin  (LIPITOR) 40 MG tablet Take 1 tablet (40 mg total) by mouth daily. Please keep upcoming appointment for further refills. 07/29/23   Lonni Slain, MD  buPROPion  (WELLBUTRIN   SR) 150 MG 12 hr tablet Take 150 mg by mouth daily. 04/14/22   [provider]  Calcium  Carbonate (CALCIUM  500 PO) Take 500 mg by mouth 3 (three) times daily. Patient not taking: Reported on 08/08/2023    [provider]  carvedilol  (COREG ) 6.25 MG tablet Take 1 tablet (6.25 mg total) by mouth 2 (two) times daily with a meal. Must keep follow up visit for further refills. 09/02/23   Vannie Reche RAMAN, NP  Cholecalciferol  (VITAMIN D -1000 MAX ST) 25 MCG (1000 UT) tablet Take 1,000 Units by mouth daily.    [provider]  cyclobenzaprine  (FLEXERIL ) 10 MG tablet Take 10 mg by mouth 3 (three) times daily as needed for muscle spasms. 03/31/21   [provider]  DULoxetine (CYMBALTA) 60 MG capsule Take 1 capsule by mouth daily.    [provider]  EPINEPHrine  0.3 mg/0.3 mL IJ SOAJ injection Inject 0.3 mg into the muscle as needed for anaphylaxis.    [provider]  gabapentin  (NEURONTIN ) 300 MG capsule Take 300 mg by mouth daily as needed (Leg pain). In the evening as needed Patient not taking: Reported on 08/08/2023 12/18/22   [provider]  gabapentin  (NEURONTIN ) 600 MG tablet Take 600 mg by mouth in the morning, at noon, in the evening, and at bedtime. 07/25/19   [provider]  hydrocortisone  (CORTEF )  10 MG tablet Take 10-20 mg by mouth See admin instructions. Takes 20 mg by mouth in the morning and 10 mg at bedtime 04/07/16   [provider]  hyoscyamine (LEVSIN) 0.125 MG tablet Take 0.125 mg by mouth 3 (three) times daily as needed for cramping. 10/20/22   [provider]  levothyroxine  (SYNTHROID ) 75 MCG tablet Take 75 mcg by mouth daily before breakfast.    [provider]  meclizine  (ANTIVERT ) 25 MG tablet 1 or 2 tabs PO q8h prn dizziness Patient taking differently: Take 25 mg by mouth 3 (three) times daily as needed for dizziness. 04/16/16   Joyice Sauer, DO  nitroGLYCERIN  (NITROSTAT ) 0.4 MG SL  tablet Place 1 tablet (0.4 mg total) under the tongue every 5 (five) minutes as needed for chest pain. 01/29/22   Vannie Reche RAMAN, NP  ondansetron  (ZOFRAN ) 4 MG tablet Take 4 mg by mouth every 6 (six) hours as needed for nausea or vomiting. 02/07/21   [provider]  oxyCODONE  20 MG TABS Take 1 tablet (20 mg total) by mouth every 6 (six) hours as needed for severe pain ((score 7 to 10)). 03/20/21   Meyran, Suzen Lacks, NP  topiramate  (TOPAMAX ) 25 MG tablet Take 25 mg by mouth 2 (two) times daily.    [provider]  zolpidem  (AMBIEN ) 10 MG tablet Take 10 mg by mouth at bedtime. 02/07/21   [provider]      Allergies    Bee venom, Erythromycin, Ibuprofen , Penicillins, Sulfa antibiotics, Cat hair extract, Dust mite extract, Tramadol, and Bactrim [sulfamethoxazole-trimethoprim]    Review of Systems   Review of Systems  Physical Exam Updated Vital Signs BP (!) 151/78   Pulse 68   Temp 98.4 F (36.9 C) (Oral)   Resp 18   Ht 5' 5 (1.651 m)   Wt 72.6 kg   SpO2 100%   BMI 26.63 kg/m  Physical Exam Constitutional:      Appearance: Normal appearance.  HENT:     Right Ear: Tympanic membrane normal.     Left Ear: Tympanic membrane normal.     Ears:     Comments: Erythematous your canals bilaterally    Nose:     Comments: Friable in both naris    Mouth/Throat:     Comments: No apparent desquamation Eyes:     Extraocular Movements: Extraocular movements intact.     Pupils: Pupils are equal, round, and reactive to light.  Neurological:     Mental Status: She is alert.            ED Results / Procedures / Treatments   Labs (all labs ordered are listed, but only abnormal results are displayed) Labs Reviewed  COMPREHENSIVE METABOLIC PANEL - Abnormal; Notable for the following components:      Result Value   CO2 21 (*)    Creatinine, Ser 1.33 (*)    GFR, Estimated 46 (*)    All other components within normal limits  CBC WITH  DIFFERENTIAL/PLATELET - Abnormal; Notable for the following components:   RDW 16.1 (*)    All other components within normal limits  SEDIMENTATION RATE  C-REACTIVE PROTEIN    EKG None  Radiology No results found.  Procedures Procedures    Medications Ordered in ED Medications  diphenhydrAMINE  (BENADRYL ) injection 25 mg (25 mg Intravenous Given 09/15/23 2200)    ED Course/ Medical Decision Making/ A&P Clinical Course as of 09/16/23 2032  Thu Sep 15, 2023  2159 Dr Lum  Cook from [facility] contacted. They will call tomorrow in the morning about an urgent appointment. Recommends holding off on PO steroids. Can try TAC or hydrocortisone .  [RP]    Clinical Course User Index [RP] Yolande Lamar BROCKS, MD                                 Medical Decision Making Amount and/or Complexity of Data Reviewed Labs: ordered.  Risk Prescription drug management.   [name] is a 58 y.o. female with comorbidities that complicate the patient evaluation including Addison's disease on hydrocortisone , hypothyroidism, hypertension, stroke on Plavix , and seizures on topiramate  who presents emergency department with rash.   Initial Ddx:  Pemphigus vulgaris, bullous pemphigoid, SJS/TEN, drug reaction, contact dermatitis  MDM/Course:  Patient presents to the emergency department with rash on her face, neck, ears, and extremities.  Portion of her face initially started off as a bullae.  Says that she thinks it might be in her nose but I do not see any obvious disclamation in that area.  Says that her mouth also feels strange but also do not see any desquamation there.  No new medications for the patient so low concern for SJS/TEN.  Considered pemphigus vulgaris as well but does not positive Nikolsky sign.  No other bullae to suggest bullous pemphigoid.  Discussed with dermatology from [facility] since we do not have dermatology here.  Did discuss whether or not it would be appropriate  to transfer the patient for emergent evaluation today or have her follow-up in clinic.  They feel that they would likely be suitable for her to follow-up in clinic.  They will call her to set up an appointment in the next few days.  Agreed that based on her symptoms now there is low concern for SJS/TEN.  Patient was notified of symptoms to look for that would suggest this diagnosis and when to come back to the emergency department.  This patient presents to the ED for concern of complaints listed in HPI, this involves an extensive number of treatment options, and is a complaint that carries with it a high risk of complications and morbidity. Disposition including potential need for admission considered.   Dispo: DC Home. Return precautions discussed including, but not limited to, those listed in the AVS. Allowed pt time to ask questions which were answered fully prior to dc.  Records reviewed Outpatient Clinic Notes The following labs were independently interpreted: CBC and show no acute abnormality I have reviewed the patients home medications and made adjustments as needed  Portions of this note were generated with Dragon dictation software. Dictation errors may occur despite best attempts at proofreading.     Final Clinical Impression(s) / ED Diagnoses Final diagnoses:  Rash    Rx / DC Orders ED Discharge Orders     None         Yolande Lamar BROCKS, MD 09/16/23 2032

## 2023-09-15 NOTE — Discharge Instructions (Signed)
 You were seen for your rash in the emergency department.   At home, please take Benadryl  and/or antihistamines for the itching.  Continue to take the pain medication that you are on for pain.    Check your MyChart online for the results of any tests that had not resulted by the time you left the emergency department.   Follow-up dermatology about setting up an appointment in several days.  Return immediately to the emergency department if you experience any of the following: Worsening rash, additional sloughing of your skin, or any other concerning symptoms.    Thank you for visiting our Emergency Department. It was a pleasure taking care of you today.

## 2023-09-15 NOTE — ED Triage Notes (Signed)
 Pt BIB [facility] with rash to left neck and face since Christmas. Pt states she was bit by something in October and was on antibiotic.  Pt had f/u with dermatotomy. Pt with issues with floor in bathroom and had to pull the whole flooring up, pt states she thinks something is wrong with her water since , pt states she has multiple areas of large in multiple places, pt states the skin is falling off.  + itching

## 2023-09-15 NOTE — ED Notes (Signed)
 [facility] paged to Dr Eloise Harman @ [phone]

## 2023-09-16 DIAGNOSIS — T148XXA Other injury of unspecified body region, initial encounter: Secondary | ICD-10-CM | POA: Diagnosis not present

## 2023-09-16 DIAGNOSIS — L280 Lichen simplex chronicus: Secondary | ICD-10-CM | POA: Diagnosis not present

## 2023-09-16 LAB — C-REACTIVE PROTEIN: CRP: <0.5 mg/dL (ref <1.0)

## 2023-09-23 DIAGNOSIS — E273 Drug-induced adrenocortical insufficiency: Secondary | ICD-10-CM | POA: Diagnosis not present

## 2023-09-23 DIAGNOSIS — M810 Age-related osteoporosis without current pathological fracture: Secondary | ICD-10-CM | POA: Diagnosis not present

## 2023-09-23 DIAGNOSIS — R739 Hyperglycemia, unspecified: Secondary | ICD-10-CM | POA: Diagnosis not present

## 2023-09-23 DIAGNOSIS — E039 Hypothyroidism, unspecified: Secondary | ICD-10-CM | POA: Diagnosis not present

## 2023-09-23 DIAGNOSIS — I10 Essential (primary) hypertension: Secondary | ICD-10-CM | POA: Diagnosis not present

## 2023-09-23 DIAGNOSIS — Z7952 Long term (current) use of systemic steroids: Secondary | ICD-10-CM | POA: Diagnosis not present

## 2023-09-23 DIAGNOSIS — R630 Anorexia: Secondary | ICD-10-CM | POA: Diagnosis not present

## 2023-09-29 DIAGNOSIS — I700 Atherosclerosis of aorta: Secondary | ICD-10-CM | POA: Diagnosis not present

## 2023-09-29 DIAGNOSIS — G894 Chronic pain syndrome: Secondary | ICD-10-CM | POA: Diagnosis not present

## 2023-09-29 DIAGNOSIS — J0100 Acute maxillary sinusitis, unspecified: Secondary | ICD-10-CM | POA: Diagnosis not present

## 2023-09-29 DIAGNOSIS — L209 Atopic dermatitis, unspecified: Secondary | ICD-10-CM | POA: Diagnosis not present

## 2023-09-29 DIAGNOSIS — E271 Primary adrenocortical insufficiency: Secondary | ICD-10-CM | POA: Diagnosis not present

## 2023-10-06 ENCOUNTER — Other Ambulatory Visit: Payer: Self-pay | Admitting: Student

## 2023-10-06 DIAGNOSIS — R109 Unspecified abdominal pain: Secondary | ICD-10-CM

## 2023-10-10 ENCOUNTER — Ambulatory Visit
Admission: RE | Admit: 2023-10-10 | Discharge: 2023-10-10 | Disposition: A | Discharge status: Home / Self Care | Payer: Medicare HMO | Source: Ambulatory Visit | Attending: Student | Admitting: Student

## 2023-10-10 DIAGNOSIS — N261 Atrophy of kidney (terminal): Secondary | ICD-10-CM | POA: Diagnosis not present

## 2023-10-10 DIAGNOSIS — R109 Unspecified abdominal pain: Secondary | ICD-10-CM

## 2023-10-10 DIAGNOSIS — R1031 Right lower quadrant pain: Secondary | ICD-10-CM | POA: Diagnosis not present

## 2023-10-11 DIAGNOSIS — T148XXA Other injury of unspecified body region, initial encounter: Secondary | ICD-10-CM | POA: Diagnosis not present

## 2023-10-11 DIAGNOSIS — L280 Lichen simplex chronicus: Secondary | ICD-10-CM | POA: Diagnosis not present

## 2023-10-11 DIAGNOSIS — R202 Paresthesia of skin: Secondary | ICD-10-CM | POA: Diagnosis not present

## 2023-10-12 ENCOUNTER — Ambulatory Visit: Payer: Medicare HMO | Admitting: Urology

## 2023-10-14 ENCOUNTER — Ambulatory Visit: Payer: Medicare HMO | Admitting: Urology

## 2023-10-14 ENCOUNTER — Encounter: Payer: Self-pay | Admitting: Allergy & Immunology

## 2023-10-14 ENCOUNTER — Other Ambulatory Visit: Payer: Self-pay

## 2023-10-14 ENCOUNTER — Telehealth (INDEPENDENT_AMBULATORY_CARE_PROVIDER_SITE_OTHER): Payer: Self-pay | Admitting: Otolaryngology

## 2023-10-14 ENCOUNTER — Ambulatory Visit (HOSPITAL_COMMUNITY)
Admission: RE | Admit: 2023-10-14 | Discharge: 2023-10-14 | Disposition: A | Discharge status: Home / Self Care | Payer: Medicare HMO | Source: Ambulatory Visit | Attending: Urology

## 2023-10-14 ENCOUNTER — Ambulatory Visit: Payer: Medicare HMO | Admitting: Allergy & Immunology

## 2023-10-14 VITALS — BP 132/84 | HR 70 | Temp 98.4°F | Resp 18 | Ht 64.17 in | Wt 164.2 lb

## 2023-10-14 VITALS — BP 116/74

## 2023-10-14 DIAGNOSIS — R109 Unspecified abdominal pain: Secondary | ICD-10-CM | POA: Diagnosis not present

## 2023-10-14 DIAGNOSIS — L299 Pruritus, unspecified: Secondary | ICD-10-CM

## 2023-10-14 DIAGNOSIS — L508 Other urticaria: Secondary | ICD-10-CM | POA: Diagnosis not present

## 2023-10-14 DIAGNOSIS — T63481D Toxic effect of venom of other arthropod, accidental (unintentional), subsequent encounter: Secondary | ICD-10-CM

## 2023-10-14 DIAGNOSIS — N1831 Chronic kidney disease, stage 3a: Secondary | ICD-10-CM

## 2023-10-14 DIAGNOSIS — N23 Unspecified renal colic: Secondary | ICD-10-CM

## 2023-10-14 DIAGNOSIS — J310 Chronic rhinitis: Secondary | ICD-10-CM

## 2023-10-14 DIAGNOSIS — R1010 Upper abdominal pain, unspecified: Secondary | ICD-10-CM | POA: Diagnosis not present

## 2023-10-14 DIAGNOSIS — Z90710 Acquired absence of both cervix and uterus: Secondary | ICD-10-CM | POA: Diagnosis not present

## 2023-10-14 LAB — URINALYSIS, ROUTINE W REFLEX MICROSCOPIC
Bilirubin, UA: NEGATIVE
Glucose, UA: NEGATIVE
Ketones, UA: NEGATIVE
Leukocytes,UA: NEGATIVE
Nitrite, UA: NEGATIVE
Protein,UA: NEGATIVE
RBC, UA: NEGATIVE
Specific Gravity, UA: 1.01 (ref 1.005–1.030)
Urobilinogen, Ur: 0.2 mg/dL (ref 0.2–1.0)
pH, UA: 6 (ref 5.0–7.5)

## 2023-10-14 NOTE — Patient Instructions (Addendum)
 1. Pruritic and chronic urticaria - We are going to get some labs to look for weird causes of itching  - Alpha-Gal Panel - ANA, IFA (with reflex) - Chronic Urticaria - C-reactive protein - CMP14+EGFR - Sedimentation rate - Tryptase - Thyroid  antibodies - Lead, blood - We will see what this shows, but I might consider starting an injection called Dupixent.  - Handout on Dupixent provided.   2. Insect sting allergy  - We are going to get a stinging insect panel. - EpiPen  is up to date.   3. Chronic rhinitis - We will et environmental allergy  panel including an extended mold panel. - We are going to so skin testing at the next visit. - We can do the most common foods at that point as well.   4. Return in about 2 weeks (around 10/28/2023) for SKIN TESTING. You can have the follow up appointment with Dr. Iva or a Nurse Practicioner (our Nurse Practitioners are excellent and always have Physician oversight!).    Please inform us  of any Emergency Department visits, hospitalizations, or changes in symptoms. Call us  before going to the ED for breathing or allergy  symptoms since we might be able to fit you in for a sick visit. Feel free to contact us  anytime with any questions, problems, or concerns.  It was a pleasure to meet you today!  Websites that have reliable patient information: 1. American Academy of Asthma, Allergy , and Immunology: www.aaaai.org 2. Food Allergy  Research and Education (FARE): foodallergy.org 3. Mothers of Asthmatics: http://www.asthmacommunitynetwork.org 4. American College of Allergy , Asthma, and Immunology: www.acaai.org      "Like" us  on Facebook and Instagram for our latest updates!      A healthy democracy works best when [employer] participate! Make sure you are registered to vote! If you have moved or changed any of your contact information, you will need to get this updated before voting! Scan the QR codes below to learn more!

## 2023-10-14 NOTE — Progress Notes (Signed)
 NEW PATIENT  Date of Service/Encounter:  10/14/23  Consult requested by: Bertell Satterfield, MD   Assessment:   Pruritic condition - followed by Dr. Jorrizzo (next appt March 19th)  Chronic urticaria  Insect sting allergy  - getting stinging insect panel  Chronic rhinitis  - getting environmental allergy  testing  Mold exposure in her home  Plan/Recommendations:   1. Pruritic and chronic urticaria - We are going to get some labs to look for weird causes of itching  - Alpha-Gal Panel - ANA, IFA (with reflex) - Chronic Urticaria - C-reactive protein - CMP14+EGFR - Sedimentation rate - Tryptase - Thyroid  antibodies - Lead, blood - We will see what this shows, but I might consider starting an injection called Dupixent.  - Handout on Dupixent provided.   2. Insect sting allergy  - We are going to get a stinging insect panel. - EpiPen  is up to date.   3. Chronic rhinitis - We will et environmental allergy  panel including an extended mold panel. - We are going to so skin testing at the next visit. - We can do the most common foods at that point as well.   4. Return in about 2 weeks (around 10/28/2023) for SKIN TESTING. You can have the follow up appointment with Dr. Iva or a Nurse Practicioner (our Nurse Practitioners are excellent and always have Physician oversight!).    This note in its entirety was forwarded to the Provider who requested this consultation.  Subjective:   [name] is a 58 y.o. female presenting today for evaluation of  Chief Complaint  Patient presents with   Other    October - November - hot water heater busted and water was all over the place and mold/ mildew grew and then spread to the bathroom.  ( Has bug bites on her body )    [name] has a history of the following: Patient Active Problem List   Diagnosis Date Noted   S/P lumbar fusion 03/18/2021   Acute CVA/punctate acute or subacute infarction in the lower Lt Paramedian  Mid Brain 01/26/2021   Acute metabolic encephalopathy 01/26/2021   Aphasia 01/25/2021   Multiple falls 01/25/2021   Genetic testing 12/31/2019   AKI (acute kidney injury) (HCC) 08/06/2019   Emesis, persistent 08/06/2019   Viral syndrome 08/05/2019   Tobacco abuse    History of colonic polyps 07/23/2019   Family history of breast cancer    Family history of colon cancer    Synovial cyst of lumbar spine 12/20/2018   Degeneration of lumbar intervertebral disc 12/01/2018   Myositis 05/11/2016   Anxiety 05/26/2015   Tremors of nervous system 05/26/2015   Neck pain 05/26/2015   Hot flushes, perimenopausal 07/24/2013   CAD (coronary artery disease) 04/11/2012   GERD (gastroesophageal reflux disease) 04/11/2012   Addison's disease (HCC) 03/03/2012   QT prolongation 12/15/2010   Palpitations 01/01/2009   Hypothyroidism 12/31/2008   Hyperlipidemia 12/31/2008   OVERWEIGHT/OBESITY 12/31/2008   Essential hypertension 12/31/2008    History obtained from: chart review and patient.  Discussed the use of AI scribe software for clinical note transcription with the patient and/or guardian, who gave verbal consent to proceed.  [name] was referred by Bertell Satterfield, MD.     [name] is a 58 y.o. female presenting for an evaluation of itching .  She has been experiencing facial swelling involving the nose, lips, face, and ears for the past two days. Despite not having been stung by a bee  recently, she is concerned about potential insect exposure following a home repair that led to an influx of insects. She carries an EpiPen  and has used it when necessary in the past.  She has a history of skin issues, including a knot on her head that developed after hitting her head on a cabinet, which later started bleeding and swelling, causing a sensation of stretching. She has consulted multiple doctors, including a neurologist, and underwent a biopsy which showed no skin abnormalities. In January, she  experienced skin peeling and a green discoloration, particularly on her neck. She has been experiencing itching on her head, face, neck, and back, with some areas appearing bald. She has tried various treatments, including clobetasol , betamethasone, hydrocortisone , hydroxyzine, and triamcinolone , with varying degrees of relief. Some treatments were stopped due to adverse reactions, such as burning sensations.   She has a history of kidney issues, including stage 3 renal kidney disease and kidney stones. Recent lab work has shown anemia, high calcium , and low protein levels. She has been advised to see a nephrologist.  She has a history of Addison's disease and is concerned about her water supply, which may contain lead. She has experienced changes in her blood work and is worried about her current health status.  She has undergone testing for Lyme disease and has been evaluated for nerve issues, with a past diagnosis of cervical and spine nerve problems. She describes symptoms that feel like an allergic reaction, with her nose and eyes being particularly affected.     Otherwise, there is no history of other atopic diseases, including drug allergies, stinging insect allergies, or contact dermatitis. There is no significant infectious history. Vaccinations are up to date.    Past Medical History: Patient Active Problem List   Diagnosis Date Noted   S/P lumbar fusion 03/18/2021   Acute CVA/punctate acute or subacute infarction in the lower Lt Paramedian Mid Brain 01/26/2021   Acute metabolic encephalopathy 01/26/2021   Aphasia 01/25/2021   Multiple falls 01/25/2021   Genetic testing 12/31/2019   AKI (acute kidney injury) (HCC) 08/06/2019   Emesis, persistent 08/06/2019   Viral syndrome 08/05/2019   Tobacco abuse    History of colonic polyps 07/23/2019   Family history of breast cancer    Family history of colon cancer    Synovial cyst of lumbar spine 12/20/2018   Degeneration of lumbar  intervertebral disc 12/01/2018   Myositis 05/11/2016   Anxiety 05/26/2015   Tremors of nervous system 05/26/2015   Neck pain 05/26/2015   Hot flushes, perimenopausal 07/24/2013   CAD (coronary artery disease) 04/11/2012   GERD (gastroesophageal reflux disease) 04/11/2012   Addison's disease (HCC) 03/03/2012   QT prolongation 12/15/2010   Palpitations 01/01/2009   Hypothyroidism 12/31/2008   Hyperlipidemia 12/31/2008   OVERWEIGHT/OBESITY 12/31/2008   Essential hypertension 12/31/2008    Medication List:  Allergies as of 10/14/2023       Reactions   Bee Venom Anaphylaxis   Erythromycin Other (See Comments), Nausea And Vomiting   Due to Pre-Existing conditions involved with stomach, patient does not take the following medication   Ibuprofen  Nausea Only   gastroparesis   Penicillins Anaphylaxis, Swelling   Has patient had a PCN reaction causing immediate rash, facial/tongue/throat swelling, SOB or lightheadedness with hypotension: Yes Has patient had a PCN reaction causing severe rash involving mucus membranes or skin necrosis: Yes Has patient had a PCN reaction that required hospitalization No Has patient had a PCN reaction occurring within the last 10  years: No If all of the above answers are NO, then may proceed with Cephalosporin use.   Sulfa Antibiotics Hives, Itching   Pt states she can take now as an adult Other Reaction(s): Hives, Tongue swelling, Vomiting   Cat Dander Hives, Itching, Swelling   SWELLING REACTION UNSPECIFIED    Dust Mite Extract Hives, Swelling   Tramadol Diarrhea   Bactrim [sulfamethoxazole-trimethoprim]    Pt states she was allergic as a child, but can take now         Medication List        Accurate as of October 14, 2023 11:59 PM. If you have any questions, ask your nurse or doctor.          STOP taking these medications    albuterol  108 (90 Base) MCG/ACT inhaler Commonly known as: VENTOLIN  HFA Stopped by: Marty Morton Shaggy    Albuterol  Sulfate 108 (90 Base) MCG/ACT Aepb Commonly known as: PROAIR  RESPICLICK Stopped by: Marty Morton Shaggy   hyoscyamine 0.125 MG tablet Commonly known as: LEVSIN Stopped by: [name]   topiramate  25 MG tablet Commonly known as: TOPAMAX  Stopped by: Marty Morton Shaggy       TAKE these medications    alendronate 70 MG tablet Commonly known as: FOSAMAX Take 70 mg by mouth once a week. Take with a full glass of water on an empty stomach On Mondays   ALPRAZolam  0.5 MG tablet Commonly known as: XANAX  Take 0.5 mg by mouth 3 (three) times daily as needed for anxiety.   atorvastatin  40 MG tablet Commonly known as: LIPITOR Take 1 tablet (40 mg total) by mouth daily. Please keep upcoming appointment for further refills.   buPROPion  150 MG 12 hr tablet Commonly known as: WELLBUTRIN  SR Take 150 mg by mouth daily.   CALCIUM  500 PO Take 500 mg by mouth 3 (three) times daily.   carvedilol  6.25 MG tablet Commonly known as: COREG  Take 1 tablet (6.25 mg total) by mouth 2 (two) times daily with a meal. Must keep follow up visit for further refills.   cyclobenzaprine  10 MG tablet Commonly known as: FLEXERIL  Take 10 mg by mouth 3 (three) times daily as needed for muscle spasms.   dicyclomine  20 MG tablet Commonly known as: BENTYL  Take 20 mg by mouth 3 (three) times daily as needed.   DULoxetine 60 MG capsule Commonly known as: CYMBALTA Take 1 capsule by mouth daily.   EPINEPHrine  0.3 mg/0.3 mL Soaj injection Commonly known as: EPI-PEN Inject 0.3 mg into the muscle as needed for anaphylaxis.   gabapentin  600 MG tablet Commonly known as: NEURONTIN  Take 600 mg by mouth in the morning, at noon, in the evening, and at bedtime. What changed: Another medication with the same name was removed. Continue taking this medication, and follow the directions you see here. Changed by: [name]   hydrocortisone  10 MG tablet Commonly known as: CORTEF  Take 10-20 mg  by mouth See admin instructions. Takes 20 mg by mouth in the morning and 10 mg at bedtime   levothyroxine  75 MCG tablet Commonly known as: SYNTHROID  Take 75 mcg by mouth daily before breakfast.   meclizine  25 MG tablet Commonly known as: ANTIVERT  1 or 2 tabs PO q8h prn dizziness What changed:  how much to take how to take this when to take this reasons to take this additional instructions   montelukast 10 MG tablet Commonly known as: SINGULAIR Take 10 mg by mouth at bedtime.   nitroGLYCERIN  0.4 MG SL  tablet Commonly known as: NITROSTAT  Place 1 tablet (0.4 mg total) under the tongue every 5 (five) minutes as needed for chest pain.   ondansetron  4 MG tablet Commonly known as: ZOFRAN  Take 4 mg by mouth every 6 (six) hours as needed for nausea or vomiting.   Oxycodone  HCl 20 MG Tabs Take 1 tablet (20 mg total) by mouth every 6 (six) hours as needed for severe pain ((score 7 to 10)).   trifluoperazine 2 MG tablet Commonly known as: STELAZINE Take 2 mg by mouth 2 (two) times daily.   Vitamin D -1000 Max St 25 MCG (1000 UT) tablet Generic drug: Cholecalciferol  Take 1,000 Units by mouth daily.   zolpidem  10 MG tablet Commonly known as: AMBIEN  Take 10 mg by mouth at bedtime.        Birth History: non-contributory  Developmental History: non-contributory  Past Surgical History: Past Surgical History:  Procedure Laterality Date   ABDOMINAL HYSTERECTOMY     BIOPSY  12/22/2022   Procedure: BIOPSY;  Surgeon: Burnette Fallow, MD;  Location: [facility];  Service: Gastroenterology;;   CARDIAC CATHETERIZATION  03/07/2007   showed 60% lesion in the right coronary artery   CHOLECYSTECTOMY     COLONOSCOPY     ESOPHAGOGASTRODUODENOSCOPY (EGD) WITH PROPOFOL  Bilateral 12/22/2022   Procedure: ESOPHAGOGASTRODUODENOSCOPY (EGD) WITH PROPOFOL ;  Surgeon: Burnette Fallow, MD;  Location: [facility];  Service: Gastroenterology;  Laterality: Bilateral;  forward scope and side view   EUS  Bilateral 12/22/2022   Procedure: UPPER ENDOSCOPIC ULTRASOUND (EUS) LINEAR;  Surgeon: Burnette Fallow, MD;  Location: [facility];  Service: Gastroenterology;  Laterality: Bilateral;   LEFT HEART CATHETERIZATION WITH CORONARY ANGIOGRAM N/A 04/13/2012   Procedure: LEFT HEART CATHETERIZATION WITH CORONARY ANGIOGRAM;  Surgeon: Debby JONETTA Como, MD;  Location: [facility] CATH LAB;  Service: Cardiovascular;  Laterality: N/A;   LUMBAR LAMINECTOMY/DECOMPRESSION MICRODISCECTOMY Left 12/20/2018   Procedure: Left Lumbar Three-Four Lumbar Laminotomy and Foraminotomy, Lumbar Four-Five Laminotomy and Foraminotomy with Microdiscectomy and Resection of Synovial Cyst;  Surgeon: Alix Charleston, MD;  Location: [facility] OR;  Service: Neurosurgery;  Laterality: Left;  Left Lumbar 3-4 Lumbar laminotomy, foraminotomy, possible microdiscectomy with possible resection of synovial cyst   SPINE SURGERY     VARICOSE VEIN SURGERY     VESICOVAGINAL FISTULA CLOSURE W/ TAH       Family History: Family History  Problem Relation Age of Onset   Cancer Father        Colon   Coronary artery disease Other    Heart attack Mother        d. 58   Breast cancer Paternal Uncle 26   Dementia Maternal Grandmother    Leukemia Maternal Uncle 1     Social History: Tabithia lives at home with her family.  She is currently on disability.    Review of systems otherwise negative other than that mentioned in the HPI.    Objective:   Blood pressure 132/84, pulse 70, temperature 98.4 F (36.9 C), resp. rate 18, height 5' 4.17 (1.63 m), weight 164 lb 3.2 oz (74.5 kg), SpO2 97%. Body mass index is 28.03 kg/m.     Physical Exam Vitals reviewed.  Constitutional:      Appearance: She is well-developed.     Comments: Anxious appearing.   HENT:     Head: Normocephalic and atraumatic.     Right Ear: Tympanic membrane, ear canal and external ear normal. No drainage, swelling or tenderness. Tympanic membrane is not injected, scarred,  erythematous, retracted or bulging.  Left Ear: Tympanic membrane, ear canal and external ear normal. No drainage, swelling or tenderness. Tympanic membrane is not injected, scarred, erythematous, retracted or bulging.     Nose: No nasal deformity, septal deviation, mucosal edema or rhinorrhea.     Right Sinus: No maxillary sinus tenderness or frontal sinus tenderness.     Left Sinus: No maxillary sinus tenderness or frontal sinus tenderness.     Mouth/Throat:     Mouth: Mucous membranes are not pale and not dry.     Pharynx: Uvula midline.  Eyes:     General:        Right eye: No discharge.        Left eye: No discharge.     Conjunctiva/sclera: Conjunctivae normal.     Right eye: Right conjunctiva is not injected. No chemosis.    Left eye: Left conjunctiva is not injected. No chemosis.    Pupils: Pupils are equal, round, and reactive to light.  Cardiovascular:     Rate and Rhythm: Normal rate and regular rhythm.     Heart sounds: Normal heart sounds.  Pulmonary:     Effort: Pulmonary effort is normal. No tachypnea, accessory muscle usage or respiratory distress.     Breath sounds: Normal breath sounds. No wheezing, rhonchi or rales.  Chest:     Chest wall: No tenderness.  Abdominal:     Tenderness: There is no abdominal tenderness. There is no guarding or rebound.  Lymphadenopathy:     Head:     Right side of head: No submandibular, tonsillar or occipital adenopathy.     Left side of head: No submandibular, tonsillar or occipital adenopathy.     Cervical: No cervical adenopathy.  Skin:    General: Skin is warm.     Capillary Refill: Capillary refill takes less than 2 seconds.     Coloration: Skin is not pale.     Findings: Rash present. No abrasion, erythema or petechiae. Rash is not papular, urticarial or vesicular.     Comments: Multiple excoriations noted over her entire body. Some nodules on the arm consistent with prurigo nodularis noted. No urticaria.  Neurological:      Mental Status: She is alert.  Psychiatric:        Behavior: Behavior is cooperative.      Diagnostic studies: labs sent instead          Marty Shaggy, MD [facility] 
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
## 2023-10-14 NOTE — Telephone Encounter (Signed)
 Left vm to confirm appt and address for 10/17/2023.

## 2023-10-17 ENCOUNTER — Institutional Professional Consult (permissible substitution) (INDEPENDENT_AMBULATORY_CARE_PROVIDER_SITE_OTHER): Payer: Medicare HMO

## 2023-10-17 LAB — HYMENOPTERA VENOM ALLERGY PROF

## 2023-10-17 NOTE — Progress Notes (Deleted)
 Dear Dr. Sherwood Gambler, Here is my assessment for our mutual patient, [name]. Thank you for allowing me the opportunity to care for your patient. Please do not hesitate to contact me should you have any other questions. Sincerely, Dr. Jovita Kussmaul  Otolaryngology Clinic Note Referring provider: Dr. Sherwood Gambler HPI:  [name] is a 58 y.o. female kindly referred by Dr. Sherwood Gambler for evaluation of ***.   H&N Surgery: *** Personal or FHx of bleeding dz or anesthesia difficulty: no ***  GLP-1: *** AP/AC: Plavix  Tobacco: ***. Alcohol: ***. Occupation: ***. Lives in *** with ***.  PMHx: Back pain, HTN, HLD, Skin sores, CVA, HTN, CAD, HLD,   Independent Review of Additional Tests or Records:  Dr. Dellis Anes (Allergy) 10/14/2023: Noted urticaria, chronic rhinitis with mold exposure; Dx: Environmental allergy panel, skin testing (pending) [facility] ([facility]) Heather Ratclife 06/14/2023: Question if some kind of bug in ear, ear pain, tried to remove herself and now bleeding; Rx: TM perforation and Left ear pain: Rx: Doxy, mupirocin  PMH/Meds/All/SocHx/FamHx/ROS:   Past Medical History:  Diagnosis Date   Addison's disease (HCC)    Addison's disease (HCC)    Adrenal insufficiency (HCC)    diagnosed 2012   Aneurysm (HCC)    Anxiety    Arthritis    Astigmatism    CAD (coronary artery disease)    Cath 2008 EF normal. RCA 50-60, Septal 50%. Myoview 3/12: EF 53% normal perfusion   Cardiac arrest (HCC)    2/2 adissonian crisis   Cardiomyopathy    resolved   Chest pain    chronicc   CHF (congestive heart failure) (HCC)    Chronic back pain    Chronic diarrhea    Concussion    sept 28th 2014   Family history of breast cancer    Family history of colon cancer    Gastroparesis    GERD (gastroesophageal reflux disease)    Headache    migraines   HTN (hypertension)    Hyperlipidemia    Hypothyroidism    Mitral valve prolapse    Nondiabetic gastroparesis    PONV (postoperative nausea and vomiting)     Pre-diabetes    QT prolongation    Stroke [facility])    possible stroke 01/2021   Tobacco abuse    down to 2 cigarettes per day   Vertigo      Past Surgical History:  Procedure Laterality Date   ABDOMINAL HYSTERECTOMY     BIOPSY  12/22/2022   Procedure: BIOPSY;  Surgeon: Willis Modena, MD;  Location: [facility];  Service: Gastroenterology;;   CARDIAC CATHETERIZATION  03/07/2007   showed 60% lesion in the right coronary artery   CHOLECYSTECTOMY     COLONOSCOPY     ESOPHAGOGASTRODUODENOSCOPY (EGD) WITH PROPOFOL Bilateral 12/22/2022   Procedure: ESOPHAGOGASTRODUODENOSCOPY (EGD) WITH PROPOFOL;  Surgeon: Willis Modena, MD;  Location: [facility];  Service: Gastroenterology;  Laterality: Bilateral;  forward scope and side view   EUS Bilateral 12/22/2022   Procedure: UPPER ENDOSCOPIC ULTRASOUND (EUS) LINEAR;  Surgeon: Willis Modena, MD;  Location: [facility];  Service: Gastroenterology;  Laterality: Bilateral;   LEFT HEART CATHETERIZATION WITH CORONARY ANGIOGRAM N/A 04/13/2012   Procedure: LEFT HEART CATHETERIZATION WITH CORONARY ANGIOGRAM;  Surgeon: Herby Abraham, MD;  Location: [facility] CATH LAB;  Service: Cardiovascular;  Laterality: N/A;   LUMBAR LAMINECTOMY/DECOMPRESSION MICRODISCECTOMY Left 12/20/2018   Procedure: Left Lumbar Three-Four Lumbar Laminotomy and Foraminotomy, Lumbar Four-Five Laminotomy and Foraminotomy with Microdiscectomy and Resection of Synovial Cyst;  Surgeon: Shirlean Kelly, MD;  Location: [facility] OR;  Service: Neurosurgery;  Laterality: Left;  Left Lumbar 3-4 Lumbar laminotomy, foraminotomy, possible microdiscectomy with possible resection of synovial cyst   SPINE SURGERY     VARICOSE VEIN SURGERY     VESICOVAGINAL FISTULA CLOSURE W/ TAH      Family History  Problem Relation Age of Onset   Cancer Father        Colon   Coronary artery disease Other    Heart attack Mother        d. 76   Breast cancer Paternal Uncle 28   Dementia Maternal Grandmother    Leukemia  Maternal Uncle 1     Social Connections: Unknown (01/18/2022)   Received from [employer], [facility]   Social Network    Social Network: Not on file      Current Outpatient Medications:    alendronate (FOSAMAX) 70 MG tablet, Take 70 mg by mouth once a week. Take with a full glass of water on an empty stomach On Mondays, Disp: , Rfl:    ALPRAZolam (XANAX) 0.5 MG tablet, Take 0.5 mg by mouth 3 (three) times daily as needed for anxiety., Disp: , Rfl:    atorvastatin (LIPITOR) 40 MG tablet, Take 1 tablet (40 mg total) by mouth daily. Please keep upcoming appointment for further refills., Disp: 90 tablet, Rfl: 0   buPROPion (WELLBUTRIN SR) 150 MG 12 hr tablet, Take 150 mg by mouth daily., Disp: , Rfl:    Calcium Carbonate (CALCIUM 500 PO), Take 500 mg by mouth 3 (three) times daily., Disp: , Rfl:    carvedilol (COREG) 6.25 MG tablet, Take 1 tablet (6.25 mg total) by mouth 2 (two) times daily with a meal. Must keep follow up visit for further refills., Disp: 180 tablet, Rfl: 0   Cholecalciferol (VITAMIN D-1000 MAX ST) 25 MCG (1000 UT) tablet, Take 1,000 Units by mouth daily., Disp: , Rfl:    cyclobenzaprine (FLEXERIL) 10 MG tablet, Take 10 mg by mouth 3 (three) times daily as needed for muscle spasms., Disp: , Rfl:    dicyclomine (BENTYL) 20 MG tablet, Take 20 mg by mouth 3 (three) times daily as needed., Disp: , Rfl:    DULoxetine (CYMBALTA) 60 MG capsule, Take 1 capsule by mouth daily., Disp: , Rfl:    EPINEPHrine 0.3 mg/0.3 mL IJ SOAJ injection, Inject 0.3 mg into the muscle as needed for anaphylaxis., Disp: , Rfl:    gabapentin (NEURONTIN) 600 MG tablet, Take 600 mg by mouth in the morning, at noon, in the evening, and at bedtime., Disp: , Rfl:    hydrocortisone (CORTEF) 10 MG tablet, Take 10-20 mg by mouth See admin instructions. Takes 20 mg by mouth in the morning and 10 mg at bedtime, Disp: , Rfl:    levothyroxine (SYNTHROID) 75 MCG tablet, Take 75 mcg by mouth daily before breakfast.,  Disp: , Rfl:    meclizine (ANTIVERT) 25 MG tablet, 1 or 2 tabs PO q8h prn dizziness (Patient taking differently: Take 25 mg by mouth 3 (three) times daily as needed for dizziness.), Disp: 15 tablet, Rfl: 0   montelukast (SINGULAIR) 10 MG tablet, Take 10 mg by mouth at bedtime., Disp: , Rfl:    nitroGLYCERIN (NITROSTAT) 0.4 MG SL tablet, Place 1 tablet (0.4 mg total) under the tongue every 5 (five) minutes as needed for chest pain., Disp: 25 tablet, Rfl: 4   ondansetron (ZOFRAN) 4 MG tablet, Take 4 mg by mouth every 6 (six) hours as needed for nausea or  vomiting., Disp: , Rfl:    oxyCODONE 20 MG TABS, Take 1 tablet (20 mg total) by mouth every 6 (six) hours as needed for severe pain ((score 7 to 10))., Disp: 30 tablet, Rfl: 0   trifluoperazine (STELAZINE) 2 MG tablet, Take 2 mg by mouth 2 (two) times daily., Disp: , Rfl:    zolpidem (AMBIEN) 10 MG tablet, Take 10 mg by mouth at bedtime., Disp: , Rfl:    Physical Exam:   There were no vitals taken for this visit.  Salient findings:  CN II-XII intact *** Bilateral EAC clear and TM intact with well pneumatized middle ear spaces Weber 512: *** Rinne 512: AC > BC b/l *** Rine 1024: AC > BC b/l *** Anterior rhinoscopy: Septum ***; bilateral inferior turbinates with *** No lesions of oral cavity/oropharynx; dentition *** No obviously palpable neck masses/lymphadenopathy/thyromegaly No respiratory distress or stridor***  Seprately Identifiable Procedures:  None***  Impression & Plans:  Meloni Hinz is a 58 y.o. female with ***  No diagnosis found.   - f/u ***  See below regarding exact medications prescribed this encounter including dosages and route: No orders of the defined types were placed in this encounter.     Thank you for allowing me the opportunity to care for your patient. Please do not hesitate to contact me should you have any other questions.  Sincerely, Jovita Kussmaul, MD Otolaryngologist (ENT), [facility] Phone: [phone] Fax: [phone]  10/17/2023, 8:09 AM   MDM:  Level *** Complexity/Problems addressed: *** Data complexity: *** independent review of *** - Morbidity: ***  - Prescription Drug prescribed or managed: ***

## 2023-10-18 ENCOUNTER — Encounter: Payer: Self-pay | Admitting: Urology

## 2023-10-18 NOTE — Patient Instructions (Signed)

## 2023-10-19 NOTE — Progress Notes (Deleted)
 Name: [name] DOB: 1966-01-27 MRN: [id number]  History of Present Illness: Ms. Vigeant is a 58 y.o. female who presents today for follow up visit at [facility].  ***She is accompanied by ***. - GU history: 1. CKD. - 09/15/2023: Creatinine 1.33, GFR 46. - 10/10/2023: Abdominal US showed "Atrophic kidneys. Increased echogenicity of the bilateral renal cortices, consistent with medical renal disease."  At initial visit with Dr. Ronne Binning on 10/14/2023: - Reported UTI and possible pyelonephritis 3 months prior. - Seen for flank pain: intermittent, sharp, mild to moderate and radiates to the right/ left lower quadrant.  - UA normal.  - The plan was:  1. CT stone study for further evaluation. 2. Referral to nephrology due to history of CKD.  Since last visit: > 10/14/2023: CT stone showed: 1. No nephrolithiasis or ureterolithiasis. No obstructive uropathy. 2. Moderate volume stool throughout the colon suggests constipation.  Today: She {Actions; denies-reports:120008} flank pain or abdominal pain. She {Actions; denies-reports:120008} fevers, nausea, or vomiting.  She {Actions; denies-reports:120008} increased urinary urgency, frequency, nocturia, dysuria, gross hematuria, hesitancy, straining to void, or sensations of incomplete emptying.   Fall Screening: Do you usually have a device to assist in your mobility? {yes/no:20286} ***cane / ***walker / ***wheelchair   Medications: Current Outpatient Medications  Medication Sig Dispense Refill   alendronate (FOSAMAX) 70 MG tablet Take 70 mg by mouth once a week. Take with a full glass of water on an empty stomach On Mondays     ALPRAZolam (XANAX) 0.5 MG tablet Take 0.5 mg by mouth 3 (three) times daily as needed for anxiety.     atorvastatin (LIPITOR) 40 MG tablet Take 1 tablet (40 mg total) by mouth daily. Please keep upcoming appointment for further refills. 90 tablet 0   buPROPion (WELLBUTRIN SR) 150 MG 12 hr  tablet Take 150 mg by mouth daily.     Calcium Carbonate (CALCIUM 500 PO) Take 500 mg by mouth 3 (three) times daily.     carvedilol (COREG) 6.25 MG tablet Take 1 tablet (6.25 mg total) by mouth 2 (two) times daily with a meal. Must keep follow up visit for further refills. 180 tablet 0   Cholecalciferol (VITAMIN D-1000 MAX ST) 25 MCG (1000 UT) tablet Take 1,000 Units by mouth daily.     cyclobenzaprine (FLEXERIL) 10 MG tablet Take 10 mg by mouth 3 (three) times daily as needed for muscle spasms.     dicyclomine (BENTYL) 20 MG tablet Take 20 mg by mouth 3 (three) times daily as needed.     DULoxetine (CYMBALTA) 60 MG capsule Take 1 capsule by mouth daily.     EPINEPHrine 0.3 mg/0.3 mL IJ SOAJ injection Inject 0.3 mg into the muscle as needed for anaphylaxis.     gabapentin (NEURONTIN) 600 MG tablet Take 600 mg by mouth in the morning, at noon, in the evening, and at bedtime.     hydrocortisone (CORTEF) 10 MG tablet Take 10-20 mg by mouth See admin instructions. Takes 20 mg by mouth in the morning and 10 mg at bedtime     levothyroxine (SYNTHROID) 75 MCG tablet Take 75 mcg by mouth daily before breakfast.     meclizine (ANTIVERT) 25 MG tablet 1 or 2 tabs PO q8h prn dizziness (Patient taking differently: Take 25 mg by mouth 3 (three) times daily as needed for dizziness.) 15 tablet 0   montelukast (SINGULAIR) 10 MG tablet Take 10 mg by mouth at bedtime.     nitroGLYCERIN (NITROSTAT)  0.4 MG SL tablet Place 1 tablet (0.4 mg total) under the tongue every 5 (five) minutes as needed for chest pain. 25 tablet 4   ondansetron (ZOFRAN) 4 MG tablet Take 4 mg by mouth every 6 (six) hours as needed for nausea or vomiting.     oxyCODONE 20 MG TABS Take 1 tablet (20 mg total) by mouth every 6 (six) hours as needed for severe pain ((score 7 to 10)). 30 tablet 0   trifluoperazine (STELAZINE) 2 MG tablet Take 2 mg by mouth 2 (two) times daily.     zolpidem (AMBIEN) 10 MG tablet Take 10 mg by mouth at bedtime.     No  current facility-administered medications for this visit.    Allergies: Allergies  Allergen Reactions   Bee Venom Anaphylaxis   Erythromycin Other (See Comments) and Nausea And Vomiting    Due to Pre-Existing conditions involved with stomach, patient does not take the following medication   Ibuprofen Nausea Only    gastroparesis    Penicillins Anaphylaxis and Swelling    Has patient had a PCN reaction causing immediate rash, facial/tongue/throat swelling, SOB or lightheadedness with hypotension: Yes Has patient had a PCN reaction causing severe rash involving mucus membranes or skin necrosis: Yes Has patient had a PCN reaction that required hospitalization No Has patient had a PCN reaction occurring within the last 10 years: No If all of the above answers are "NO", then may proceed with Cephalosporin use.    Sulfa Antibiotics Hives and Itching    Pt states she can take now as an adult  Other Reaction(s): Hives, Tongue swelling, Vomiting   Cat Dander Hives, Itching and Swelling    SWELLING REACTION UNSPECIFIED    Dust Mite Extract Hives and Swelling   Tramadol Diarrhea   Bactrim [Sulfamethoxazole-Trimethoprim]     Pt states she was allergic as a child, but can take now     Past Medical History:  Diagnosis Date   Addison's disease (HCC)    Addison's disease (HCC)    Adrenal insufficiency (HCC)    diagnosed 2012   Aneurysm (HCC)    Anxiety    Arthritis    Astigmatism    CAD (coronary artery disease)    Cath 2008 EF normal. RCA 50-60, Septal 50%. Myoview 3/12: EF 53% normal perfusion   Cardiac arrest (HCC)    2/2 adissonian crisis   Cardiomyopathy    resolved   Chest pain    chronicc   CHF (congestive heart failure) (HCC)    Chronic back pain    Chronic diarrhea    Concussion    sept 28th 2014   Family history of breast cancer    Family history of colon cancer    Gastroparesis    GERD (gastroesophageal reflux disease)    Headache    migraines   HTN  (hypertension)    Hyperlipidemia    Hypothyroidism    Mitral valve prolapse    Nondiabetic gastroparesis    PONV (postoperative nausea and vomiting)    Pre-diabetes    QT prolongation    Stroke [facility])    possible stroke 01/2021   Tobacco abuse    down to 2 cigarettes per day   Vertigo    Past Surgical History:  Procedure Laterality Date   ABDOMINAL HYSTERECTOMY     BIOPSY  12/22/2022   Procedure: BIOPSY;  Surgeon: Willis Modena, MD;  Location: [facility];  Service: Gastroenterology;;   CARDIAC CATHETERIZATION  03/07/2007   showed  60% lesion in the right coronary artery   CHOLECYSTECTOMY     COLONOSCOPY     ESOPHAGOGASTRODUODENOSCOPY (EGD) WITH PROPOFOL Bilateral 12/22/2022   Procedure: ESOPHAGOGASTRODUODENOSCOPY (EGD) WITH PROPOFOL;  Surgeon: Willis Modena, MD;  Location: [facility];  Service: Gastroenterology;  Laterality: Bilateral;  forward scope and side view   EUS Bilateral 12/22/2022   Procedure: UPPER ENDOSCOPIC ULTRASOUND (EUS) LINEAR;  Surgeon: Willis Modena, MD;  Location: [facility];  Service: Gastroenterology;  Laterality: Bilateral;   LEFT HEART CATHETERIZATION WITH CORONARY ANGIOGRAM N/A 04/13/2012   Procedure: LEFT HEART CATHETERIZATION WITH CORONARY ANGIOGRAM;  Surgeon: Herby Abraham, MD;  Location: [facility] CATH LAB;  Service: Cardiovascular;  Laterality: N/A;   LUMBAR LAMINECTOMY/DECOMPRESSION MICRODISCECTOMY Left 12/20/2018   Procedure: Left Lumbar Three-Four Lumbar Laminotomy and Foraminotomy, Lumbar Four-Five Laminotomy and Foraminotomy with Microdiscectomy and Resection of Synovial Cyst;  Surgeon: Shirlean Kelly, MD;  Location: [facility] OR;  Service: Neurosurgery;  Laterality: Left;  Left Lumbar 3-4 Lumbar laminotomy, foraminotomy, possible microdiscectomy with possible resection of synovial cyst   SPINE SURGERY     VARICOSE VEIN SURGERY     VESICOVAGINAL FISTULA CLOSURE W/ TAH     Family History  Problem Relation Age of Onset   Cancer Father        Colon    Coronary artery disease Other    Heart attack Mother        d. 51   Breast cancer Paternal Uncle 23   Dementia Maternal Grandmother    Leukemia Maternal Uncle 1   Social History   Socioeconomic History   Marital status: Divorced    Spouse name: Not on file   Number of children: Not on file   Years of education: Not on file   Highest education level: Not on file  Occupational History   Not on file  Tobacco Use   Smoking status: Former    Current packs/day: 0.00    Average packs/day: 0.3 packs/day for 10.0 years (2.5 ttl pk-yrs)    Types: Cigarettes    Start date: 01/26/2011    Quit date: 01/25/2021    Years since quitting: 2.7   Smokeless tobacco: Never   Tobacco comments:    smokes 2 a day  Vaping Use   Vaping status: Former   Substances: Nicotine, CBD  Substance and Sexual Activity   Alcohol use: No   Drug use: Yes    Types: Marijuana, Oxycodone    Comment: History of Cocaine use   Sexual activity: Not Currently    Birth control/protection: Surgical  Other Topics Concern   Not on file  Social History Narrative   Lives in [place] with daughter and son. She is on disability.    Social Drivers of Corporate investment banker Strain: Not on file  Food Insecurity: Not on file  Transportation Needs: Not on file  Physical Activity: Not on file  Stress: Not on file  Social Connections: Unknown (01/18/2022)   Received from [facility], [facility]   Social Network    Social Network: Not on file  Intimate Partner Violence: Unknown (12/10/2021)   Received from [facility], [facility]   HITS    Physically Hurt: Not on file    Insult or Talk Down To: Not on file    Threaten Physical Harm: Not on file    Scream or Curse: Not on file    Review of Systems Constitutional: Patient denies any unintentional weight loss or change in strength lntegumentary: Patient denies any rashes  or pruritus Cardiovascular: Patient denies chest pain or syncope Respiratory:  Patient denies shortness of breath Gastrointestinal: Patient ***denies nausea, vomiting, constipation, or diarrhea Musculoskeletal: Patient denies muscle cramps or weakness Neurologic: Patient denies convulsions or seizures Allergic/Immunologic: Patient denies recent allergic reaction(s) Hematologic/Lymphatic: Patient denies bleeding tendencies Endocrine: Patient denies heat/cold intolerance  GU: As per HPI.  OBJECTIVE There were no vitals filed for this visit. There is no height or weight on file to calculate BMI.  Physical Examination Constitutional: No obvious distress; patient is non-toxic appearing  Cardiovascular: No visible lower extremity edema.  Respiratory: The patient does not have audible wheezing/stridor; respirations do not appear labored  Gastrointestinal: Abdomen non-distended Musculoskeletal: Normal ROM of UEs  Skin: No obvious rashes/open sores  Neurologic: CN 2-12 grossly intact Psychiatric: Answered questions appropriately with normal affect  Hematologic/Lymphatic/Immunologic: No obvious bruises or sites of spontaneous bleeding  UA:  ***positive for *** leukocytes, *** blood, ***nitrites ***Urine microscopy:  ***negative  *** WBC/hpf, *** RBC/hpf, *** bacteria ***with no evidence of UTI ***with no evidence of microscopic hematuria ***otherwise unremarkable ***glucosuria (secondary to ***Jardiance ***Farxiga use)  PVR: *** ml  ASSESSMENT No diagnosis found. ***  We agreed to plan for follow up in *** months / ***1 year or sooner if needed. Patient verbalized understanding of and agreement with current plan. All questions were answered.  PLAN Advised the following: 1. *** 2. ***No follow-ups on file.  No orders of the defined types were placed in this encounter.   It has been explained that the patient is to follow regularly with their PCP in addition to all other providers involved in their care and to follow instructions provided by these  respective offices. Patient advised to contact urology clinic if any urologic-pertaining questions, concerns, new symptoms or problems arise in the interim period.  There are no Patient Instructions on file for this visit.  Electronically signed by:  Donnita Falls, FNP   10/19/23    3:06 PM

## 2023-10-19 NOTE — Progress Notes (Signed)
Yes and high potency topic corticosteroid if she has not yet

## 2023-10-20 ENCOUNTER — Telehealth: Payer: Self-pay

## 2023-10-20 DIAGNOSIS — R1310 Dysphagia, unspecified: Secondary | ICD-10-CM | POA: Diagnosis not present

## 2023-10-20 DIAGNOSIS — K921 Melena: Secondary | ICD-10-CM | POA: Diagnosis not present

## 2023-10-20 DIAGNOSIS — R197 Diarrhea, unspecified: Secondary | ICD-10-CM | POA: Diagnosis not present

## 2023-10-20 DIAGNOSIS — R109 Unspecified abdominal pain: Secondary | ICD-10-CM | POA: Diagnosis not present

## 2023-10-20 DIAGNOSIS — R110 Nausea: Secondary | ICD-10-CM | POA: Diagnosis not present

## 2023-10-20 MED ORDER — TACROLIMUS 0.1 % EX OINT: TOPICAL_OINTMENT | Freq: Two times a day (BID) | CUTANEOUS | 3 refills | Status: DC | PRN

## 2023-10-20 NOTE — Addendum Note (Signed)
Addended by: Alfonse Spruce on: 10/20/2023 12:57 PM   Modules accepted: Orders

## 2023-10-20 NOTE — Telephone Encounter (Signed)
I called patient and informed.

## 2023-10-20 NOTE — Telephone Encounter (Signed)
-----   Message from Alfonse Spruce sent at 10/20/2023 12:57 PM EST ----- Can someone call and tell her I sent in a new topical ointment that we have to try before stating the injectable medications?

## 2023-10-21 ENCOUNTER — Encounter: Payer: Self-pay | Admitting: Allergy & Immunology

## 2023-10-21 LAB — CMP14+EGFR
ALT: 20 [IU]/L (ref 0–32)
AST: 26 [IU]/L (ref 0–40)
Albumin: 4.2 g/dL (ref 3.8–4.9)
Alkaline Phosphatase: 92 [IU]/L (ref 44–121)
BUN/Creatinine Ratio: 14 (ref 9–23)
BUN: 17 mg/dL (ref 6–24)
Bilirubin Total: <0.2 mg/dL (ref 0.0–1.2)
CO2: 22 mmol/L (ref 20–29)
Calcium: 9.2 mg/dL (ref 8.7–10.2)
Chloride: 103 mmol/L (ref 96–106)
Creatinine, Ser: 1.21 mg/dL — ABNORMAL HIGH (ref 0.57–1.00)
Globulin, Total: 2.5 g/dL (ref 1.5–4.5)
Glucose: 118 mg/dL — ABNORMAL HIGH (ref 70–99)
Potassium: 4.7 mmol/L (ref 3.5–5.2)
Sodium: 144 mmol/L (ref 134–144)
Total Protein: 6.7 g/dL (ref 6.0–8.5)
eGFR: 52 mL/min/{1.73_m2} — ABNORMAL LOW (ref >59)

## 2023-10-21 LAB — ALLERGENS W/COMP RFLX AREA 2

## 2023-10-21 LAB — ALLERGEN PROFILE, MOLD

## 2023-10-21 LAB — HYMENOPTERA VENOM ALLERGY PROF: Tryptase: 8.6 ug/L (ref 2.2–13.2)

## 2023-10-21 LAB — ALPHA-GAL PANEL
Allergen Lamb IgE: 0.15 kU/L — AB
Beef IgE: 0.22 kU/L — AB
IgE (Immunoglobulin E), Serum: 191 [IU]/mL (ref 6–495)
O215-IgE Alpha-Gal: 0.49 kU/L — AB

## 2023-10-21 LAB — C-REACTIVE PROTEIN: CRP: <1 mg/L (ref 0–10)

## 2023-10-21 LAB — ANTINUCLEAR ANTIBODIES, IFA

## 2023-10-21 LAB — CHRONIC URTICARIA: cu index: 1.9 (ref <10)

## 2023-10-21 LAB — THYROID ANTIBODIES: Thyroperoxidase Ab SerPl-aCnc: 16 [IU]/mL (ref 0–34)

## 2023-10-21 LAB — TRYPTASE

## 2023-10-21 LAB — LEAD, BLOOD (ADULT >= 16 YRS)

## 2023-10-21 LAB — SEDIMENTATION RATE: Sed Rate: 17 mm/h (ref 0–40)

## 2023-10-21 LAB — ALLERGEN COMPONENT COMMENTS

## 2023-10-21 MED ORDER — CLOBETASOL PROPIONATE 0.05 % EX OINT: 1.0000 | TOPICAL_OINTMENT | Freq: Two times a day (BID) | CUTANEOUS | 1 refills | Status: DC

## 2023-10-21 MED ORDER — TRIAMCINOLONE ACETONIDE 0.1 % EX OINT: 1.0000 | TOPICAL_OINTMENT | Freq: Two times a day (BID) | CUTANEOUS | 0 refills | Status: DC | PRN

## 2023-10-21 NOTE — Addendum Note (Signed)
Addended by: Alfonse Spruce on: 10/21/2023 01:22 PM   Modules accepted: Orders

## 2023-10-24 ENCOUNTER — Other Ambulatory Visit (HOSPITAL_BASED_OUTPATIENT_CLINIC_OR_DEPARTMENT_OTHER): Payer: Self-pay | Admitting: Cardiology

## 2023-10-24 ENCOUNTER — Ambulatory Visit: Payer: Medicare HMO | Admitting: Urology

## 2023-10-24 DIAGNOSIS — M48061 Spinal stenosis, lumbar region without neurogenic claudication: Secondary | ICD-10-CM | POA: Diagnosis not present

## 2023-10-24 DIAGNOSIS — I700 Atherosclerosis of aorta: Secondary | ICD-10-CM

## 2023-10-24 DIAGNOSIS — E785 Hyperlipidemia, unspecified: Secondary | ICD-10-CM

## 2023-10-24 DIAGNOSIS — F1120 Opioid dependence, uncomplicated: Secondary | ICD-10-CM | POA: Diagnosis not present

## 2023-10-24 DIAGNOSIS — E271 Primary adrenocortical insufficiency: Secondary | ICD-10-CM | POA: Diagnosis not present

## 2023-10-24 DIAGNOSIS — I10 Essential (primary) hypertension: Secondary | ICD-10-CM | POA: Diagnosis not present

## 2023-10-24 DIAGNOSIS — Z6825 Body mass index (BMI) 25.0-25.9, adult: Secondary | ICD-10-CM | POA: Diagnosis not present

## 2023-10-24 DIAGNOSIS — M353 Polymyalgia rheumatica: Secondary | ICD-10-CM | POA: Diagnosis not present

## 2023-10-24 DIAGNOSIS — R109 Unspecified abdominal pain: Secondary | ICD-10-CM

## 2023-10-24 DIAGNOSIS — M8000XD Age-related osteoporosis with current pathological fracture, unspecified site, subsequent encounter for fracture with routine healing: Secondary | ICD-10-CM | POA: Diagnosis not present

## 2023-10-24 DIAGNOSIS — N1831 Chronic kidney disease, stage 3a: Secondary | ICD-10-CM

## 2023-10-24 DIAGNOSIS — J0100 Acute maxillary sinusitis, unspecified: Secondary | ICD-10-CM | POA: Diagnosis not present

## 2023-10-24 DIAGNOSIS — M4802 Spinal stenosis, cervical region: Secondary | ICD-10-CM | POA: Diagnosis not present

## 2023-10-24 DIAGNOSIS — G894 Chronic pain syndrome: Secondary | ICD-10-CM | POA: Diagnosis not present

## 2023-10-24 DIAGNOSIS — Z8673 Personal history of transient ischemic attack (TIA), and cerebral infarction without residual deficits: Secondary | ICD-10-CM

## 2023-11-02 ENCOUNTER — Ambulatory Visit: Payer: Medicare HMO | Admitting: Allergy & Immunology

## 2023-11-02 DIAGNOSIS — J302 Other seasonal allergic rhinitis: Secondary | ICD-10-CM

## 2023-11-02 DIAGNOSIS — J3089 Other allergic rhinitis: Secondary | ICD-10-CM | POA: Diagnosis not present

## 2023-11-02 DIAGNOSIS — L508 Other urticaria: Secondary | ICD-10-CM

## 2023-11-02 DIAGNOSIS — L299 Pruritus, unspecified: Secondary | ICD-10-CM

## 2023-11-02 DIAGNOSIS — T63481D Toxic effect of venom of other arthropod, accidental (unintentional), subsequent encounter: Secondary | ICD-10-CM

## 2023-11-02 NOTE — Progress Notes (Signed)
 FOLLOW UP  Date of Service/Encounter:  11/02/23   Assessment:   Pruritic condition - followed by [name] (next appt March 19th), starting [name]   Insect sting allergy - getting stinging insect panel   Chronic rhinitis  - with testing positive to tree mix and dog   Mold exposure in her home  Plan/Recommendations:   1. Pruritic and chronic urticaria - Continue to avoid red meat. - Otherwise, the rest of your testing was fairly normal aside from a mildly elevated creatinine consistent with chronic kidney disease.  - Consent signed to start Nemluvio. - Handout provided. - [name] will reach out to discuss the approval process with you.  - Continue with as needed: tacrolimus twice daily (safe to use on the face), triamcinolone twice daily (do NOT use on the face), and clobetasol twice daily (do NOT use on the face and use for only two weeks at a time).   2. Insect sting allergy - Stinging insect panel was negative. - We can do skin testing for that if you want, but I would just keep your EpiPen and avoid stinging insects as much as you can.  - We only do the sting insect skin testing at the [name].  - But I would only do the skin testing if you were thinking of starting venom allergy shots.   3. Chronic rhinitis - Testing was positive to tree mix and dog mix.  - Copy of testing results provided. - Avoidance measures provided.   4. Return in about 3 months (around 01/30/2024). You can have the follow up appointment with Dr. Dellis L[facility] or a Nurse Practicioner (our Nurse Practitioners are excellent and always have Physician oversight!).    Subjective:   [name] is a 58 y.o. female presenting today for follow up of No chief complaint on file.   [name] has a history of the following: Patient Active Problem List   Diagnosis Date Noted   S/P lumbar fusion 03/18/2021   Acute CVA/punctate acute or subacute infarction in the lower Lt Paramed[facility] Mid  Brain 01/26/2021   Acute metabolic encephalopathy 01/26/2021   Aphasia 01/25/2021   Multiple falls 01/25/2021   Genetic testing 12/31/2019   AKI (acute kidney injury) (HCC) 08/06/2019   Emesis, persistent 08/06/2019   Viral syndrome 08/05/2019   Tobacco abuse    History of colonic polyps 07/23/2019   Family history of breast cancer    Family history of colon cancer    Synovial cyst of lumbar spine 12/20/2018   Degeneration of lumbar intervertebral disc 12/01/2018   Myositis 05/11/2016   Anxiety 05/26/2015   Tremors of nervous system 05/26/2015   Neck pain 05/26/2015   Hot flushes, perimenopausal 07/24/2013   CAD (coronary artery disease) 04/11/2012   GERD (gastroesophageal reflux disease) 04/11/2012   Addison's disease (HCC) 03/03/2012   QT prolongation 12/15/2010   Palpitations 01/01/2009   Hypothyroidism 12/31/2008   Hyperlipidemia 12/31/2008   OVERWEIGHT/OBESITY 12/31/2008   Essential hypertension 12/31/2008    History obtained from: chart review and patient.  Discussed the use of AI scribe software for clinical note transcription with the patient and/or guardian, who gave verbal consent to proceed.  [name] is a 58 y.o. female presenting for skin testing. She was last seen on February 2025. We could not do testing because her insurance company does not cover testing on the same day as a New Patient visit. She has been off of all antihistamines 3 days in anticipation of the  testing.   At that time, we did labs due to her history of itching. We obtained a stinging insect panel. We also obtained environmental allergy panel.   Notable for mild kidney disease.  Alpha gal is mildly elevated as well as we recommended avoiding the knees.  Environmental allergy testing was negative to the entire panel.  All of the other labs we did were negative as well.  We added on clobetasol to use for 2 weeks at a time and triamcinolone to use twice daily.  Otherwise, there have been no changes  to her past medical history, surgical history, family history, or social history.    Review of systems otherwise negative other than that mentioned in the HPI.    Objective:   There were no vitals taken for this visit. There is no height or weight on file to calculate BMI.    Physical exam deferred since this was a skin testing appointment only.   Diagnostic studies:    Airborne Adult Perc - 11/02/23 1418     Time Antigen Placed 1400    Allergen Manufacturer [name]    Location Back    Number of Test 55    1. Control-Buffer 50% Glycerol Negative    2. Control-Histamine 2+    3. Bahia Negative    4. French Southern Territories Negative    5. Johnson Negative    6. Kentucky Blue Negative    7. Meadow Fescue Negative    8. Perennial Rye Negative    9. Timothy Negative    10. Ragweed Mix Negative    11. Cocklebur Negative    12. Plantain,  English Negative    13. Baccharis Negative    14. Dog Fennel Negative    15. Russian Thistle Negative    16. Lamb's Quarters Negative    17. Sheep Sorrell Negative    18. Rough Pigweed Negative    19. Marsh Elder, Rough Negative    20. Mugwort, Common Negative    21. Box, Elder Negative    22. Cedar, red Negative    23. Sweet Gum Negative    24. Pecan Pollen Negative    25. Pine Mix Negative    26. Walnut, Black Pollen Negative    27. Red Mulberry Negative    28. Ash Mix Negative    29. Birch Mix Negative    30. Beech American Negative    31. Cottonwood, Guinea-Bissau Negative    32. Hickory, White Negative    33. Maple Mix Negative    34. Oak, Guinea-Bissau Mix Negative    35. Sycamore Eastern Negative    36. Alternaria Alternata Negative    37. Cladosporium Herbarum Negative    38. Aspergillus Mix Negative    39. Penicillium Mix Negative    40. Bipolaris Sorokiniana (Helminthosporium) Negative    41. Drechslera Spicifera (Curvularia) Negative    42. Mucor Plumbeus Negative    43. Fusarium Moniliforme Negative    44. Aureobasidium Pullulans  (pullulara) Negative    45. Rhizopus Oryzae Negative    46. Botrytis Cinera Negative    47. Epicoccum Nigrum Negative    48. Phoma Betae Negative    49. Dust Mite Mix Negative    50. Cat Hair 10,000 BAU/ml Negative    51.  Dog Epithelia Negative    52. Mixed Feathers Negative    53. Horse Epithelia Negative    54. Cockroach, German Negative    55. Tobacco Leaf Negative  Intradermal - 11/02/23 1418     Time Antigen Placed 1420    Allergen Manufacturer [name]    Location Arm    Number of Test 16    Intradermal Select    Control Negative    Bahia Negative    French Southern Territories Negative    Johnson Negative    7 Grass Negative    Ragweed Mix Negative    Weed Mix Negative    Tree Mix 3+    Mold 1 Negative    Mold 2 Negative    Mold 3 Negative    Mold 4 Negative    Mite Mix Negative    Cat Negative    Dog 2+    Cockroach Negative                 Malachi Bonds, MD  [facility]

## 2023-11-02 NOTE — Patient Instructions (Addendum)
 1. Pruritic and chronic urticaria - Continue to avoid red meat. - Otherwise, the rest of your testing was fairly normal aside from a mildly elevated creatinine consistent with chronic kidney disease.  - Consent signed to start Nemluvio. - Handout provided. - [name] will reach out to discuss the approval process with you.  - Continue with as needed: tacrolimus twice daily (safe to use on the face), triamcinolone twice daily (do NOT use on the face), and clobetasol twice daily (do NOT use on the face and use for only two weeks at a time).   2. Insect sting allergy - Stinging insect panel was negative. - We can do skin testing for that if you want, but I would just keep your EpiPen and avoid stinging insects as much as you can.  - We only do the sting insect skin testing at the [facility] office.  - But I would only do the skin testing if you were thinking of starting venom allergy shots.   3. Chronic rhinitis - Testing was positive to tree mix and dog mix.  - Copy of testing results provided. - Avoidance measures provided.   4. Return in about 3 months (around 01/30/2024). You can have the follow up appointment with Dr. Dellis Anes or a Nurse Practicioner (our Nurse Practitioners are excellent and always have Physician oversight!).    Please inform us of any Emergency Department visits, hospitalizations, or changes in symptoms. Call us before going to the ED for breathing or allergy symptoms since we might be able to fit you in for a sick visit. Feel free to contact us anytime with any questions, problems, or concerns.  It was a pleasure to meet you today!  Websites that have reliable patient information: 1. American Academy of Asthma, Allergy, and Immunology: www.aaaai.org 2. Food Allergy Research and Education (FARE): foodallergy.org 3. Mothers of Asthmatics: http://www.asthmacommunitynetwork.org 4. American College of Allergy, Asthma, and Immunology: www.acaai.org      "Like" [place] on  Facebook and Instagram for our latest updates!      A healthy democracy works best when [employer] participate! Make sure you are registered to vote! If you have moved or changed any of your contact information, you will need to get this updated before voting! Scan the QR codes below to learn more!         Airborne Adult Perc - 11/02/23 1418     Time Antigen Placed 1400    Allergen Manufacturer Waynette Buttery    Location Back    Number of Test 55    1. Control-Buffer 50% Glycerol Negative    2. Control-Histamine 2+    3. Bahia Negative    4. French Southern Territories Negative    5. Johnson Negative    6. Kentucky Blue Negative    7. Meadow Fescue Negative    8. Perennial Rye Negative    9. Timothy Negative    10. Ragweed Mix Negative    11. Cocklebur Negative    12. Plantain,  English Negative    13. Baccharis Negative    14. Dog Fennel Negative    15. Russian Thistle Negative    16. Lamb's Quarters Negative    17. Sheep Sorrell Negative    18. Rough Pigweed Negative    19. Marsh Elder, Rough Negative    20. Mugwort, Common Negative    21. Box, Elder Negative    22. Cedar, red Negative    23. Sweet Gum Negative    24. Pecan Pollen Negative  25. Pine Mix Negative    26. Walnut, Black Pollen Negative    27. Red Mulberry Negative    28. Ash Mix Negative    29. Birch Mix Negative    30. Beech American Negative    31. Cottonwood, Guinea-Bissau Negative    32. Hickory, White Negative    33. Maple Mix Negative    34. Oak, Guinea-[place]iss[place] Mix Negative    35. Sycamore Eastern Negative    36. Alternaria Alternata Negative    37. Cladosporium Herbarum Negative    38. Aspergillus Mix Negative    39. Penicillium Mix Negative    40. Bipolaris Sorokiniana (Helminthosporium) Negative    41. Drechslera Spicifera (Curvularia) Negative    42. Mucor Plumbeus Negative    43. Fusarium Moniliforme Negative    44. Aureobasidium Pullulans (pullulara) Negative    45. Rhizopus Oryzae Negative    46. Botrytis Cinera  Negative    47. Epicoccum Nigrum Negative    48. Phoma Betae Negative    49. Dust Mite Mix Negative    50. Cat Hair 10,000 BAU/ml Negative    51.  Dog Epithelia Negative    52. Mixed Feathers Negative    53. Horse Epithelia Negative    54. Cockroach, German Negative    55. Tobacco Leaf Negative             Intradermal - 11/02/23 1418     Time Antigen Placed 1420    Allergen Manufacturer Waynette Buttery    Location Arm    Number of Test 16    Intradermal Select    Control Negative    Bahia Negative    French Southern Territories Negative    Johnson Negative    7 Grass Negative    Ragweed Mix Negative    Weed Mix Negative    Tree Mix 3+    Mold 1 Negative    Mold 2 Negative    Mold 3 Negative    Mold 4 Negative    Mite Mix Negative    Cat Negative    Dog 2+    Cockroach Negative             Reducing Pollen Exposure  The American Academy of Allergy, Asthma and Immunology suggests the following steps to reduce your exposure to pollen during allergy seasons.    Do not hang sheets or clothing out to dry; pollen may collect on these items. Do not mow lawns or spend time around freshly cut grass; mowing stirs up pollen. Keep windows closed at night.  Keep car windows closed while driving. Minimize morning activities outdoors, a time when pollen counts are usually at their highest. Stay indoors as much as possible when pollen counts or humidity is high and on windy days when pollen tends to remain in the air longer. Use air conditioning when possible.  Many air conditioners have filters that trap the pollen spores. Use a HEPA room air filter to remove pollen form the indoor air you breathe.  Control of Dog or Cat Allergen  Avoidance is the best way to manage a dog or cat allergy. If you have a dog or cat and are allergic to dog or cats, consider removing the dog or cat from the home. If you have a dog or cat but don't want to find it a new home, or if your family wants a pet even though someone  in the household is allergic, here are some strategies that may help keep symptoms at bay:  Keep the pet out of your bedroom and restrict it to only a few rooms. Be advised that keeping the dog or cat in only one room will not limit the allergens to that room. Don't pet, hug or kiss the dog or cat; if you do, wash your hands with soap and water. High-efficiency particulate air (HEPA) cleaners run continuously in a bedroom or living room can reduce allergen levels over time. Regular use of a high-efficiency vacuum cleaner or a central vacuum can reduce allergen levels. Giving your dog or cat a bath at least once a week can reduce airborne allergen.

## 2023-11-03 DIAGNOSIS — I340 Nonrheumatic mitral (valve) insufficiency: Secondary | ICD-10-CM | POA: Diagnosis not present

## 2023-11-03 DIAGNOSIS — E271 Primary adrenocortical insufficiency: Secondary | ICD-10-CM | POA: Diagnosis not present

## 2023-11-03 DIAGNOSIS — N1831 Chronic kidney disease, stage 3a: Secondary | ICD-10-CM | POA: Diagnosis not present

## 2023-11-03 DIAGNOSIS — R809 Proteinuria, unspecified: Secondary | ICD-10-CM | POA: Diagnosis not present

## 2023-11-03 DIAGNOSIS — I129 Hypertensive chronic kidney disease with stage 1 through stage 4 chronic kidney disease, or unspecified chronic kidney disease: Secondary | ICD-10-CM | POA: Diagnosis not present

## 2023-11-03 DIAGNOSIS — N189 Chronic kidney disease, unspecified: Secondary | ICD-10-CM | POA: Diagnosis not present

## 2023-11-03 DIAGNOSIS — D631 Anemia in chronic kidney disease: Secondary | ICD-10-CM | POA: Diagnosis not present

## 2023-11-05 ENCOUNTER — Encounter: Payer: Self-pay | Admitting: Allergy & Immunology

## 2023-11-08 DIAGNOSIS — L609 Nail disorder, unspecified: Secondary | ICD-10-CM | POA: Diagnosis not present

## 2023-11-08 DIAGNOSIS — M79675 Pain in left toe(s): Secondary | ICD-10-CM | POA: Diagnosis not present

## 2023-11-08 DIAGNOSIS — I739 Peripheral vascular disease, unspecified: Secondary | ICD-10-CM | POA: Diagnosis not present

## 2023-11-08 DIAGNOSIS — M79671 Pain in right foot: Secondary | ICD-10-CM | POA: Diagnosis not present

## 2023-11-08 DIAGNOSIS — M79674 Pain in right toe(s): Secondary | ICD-10-CM | POA: Diagnosis not present

## 2023-11-08 DIAGNOSIS — M79672 Pain in left foot: Secondary | ICD-10-CM | POA: Diagnosis not present

## 2023-11-08 DIAGNOSIS — L110 Acquired keratosis follicularis: Secondary | ICD-10-CM | POA: Diagnosis not present

## 2023-11-15 DIAGNOSIS — R202 Paresthesia of skin: Secondary | ICD-10-CM | POA: Diagnosis not present

## 2023-11-15 DIAGNOSIS — T148XXA Other injury of unspecified body region, initial encounter: Secondary | ICD-10-CM | POA: Diagnosis not present

## 2023-11-18 ENCOUNTER — Telehealth: Payer: Self-pay | Admitting: *Deleted

## 2023-11-18 NOTE — Telephone Encounter (Signed)
-----   Message from Alfonse Spruce sent at 11/05/2023  6:44 AM EST ----- Hey there - Nemluvio consent signed.

## 2023-11-18 NOTE — Telephone Encounter (Signed)
 L/m for patient to reach out to discuss Nemluvio affordability and support for same

## 2023-11-24 DIAGNOSIS — I10 Essential (primary) hypertension: Secondary | ICD-10-CM | POA: Diagnosis not present

## 2023-11-24 DIAGNOSIS — M353 Polymyalgia rheumatica: Secondary | ICD-10-CM | POA: Diagnosis not present

## 2023-11-24 DIAGNOSIS — J0100 Acute maxillary sinusitis, unspecified: Secondary | ICD-10-CM | POA: Diagnosis not present

## 2023-11-24 DIAGNOSIS — L209 Atopic dermatitis, unspecified: Secondary | ICD-10-CM | POA: Diagnosis not present

## 2023-11-24 DIAGNOSIS — G894 Chronic pain syndrome: Secondary | ICD-10-CM | POA: Diagnosis not present

## 2023-11-27 ENCOUNTER — Other Ambulatory Visit (HOSPITAL_BASED_OUTPATIENT_CLINIC_OR_DEPARTMENT_OTHER): Payer: Self-pay | Admitting: Family

## 2023-11-27 DIAGNOSIS — I700 Atherosclerosis of aorta: Secondary | ICD-10-CM

## 2023-11-27 DIAGNOSIS — I10 Essential (primary) hypertension: Secondary | ICD-10-CM

## 2023-11-27 DIAGNOSIS — I25118 Atherosclerotic heart disease of native coronary artery with other forms of angina pectoris: Secondary | ICD-10-CM

## 2023-11-29 DIAGNOSIS — K648 Other hemorrhoids: Secondary | ICD-10-CM | POA: Diagnosis not present

## 2023-11-29 DIAGNOSIS — D122 Benign neoplasm of ascending colon: Secondary | ICD-10-CM | POA: Diagnosis not present

## 2023-11-29 DIAGNOSIS — Z860101 Personal history of adenomatous and serrated colon polyps: Secondary | ICD-10-CM | POA: Diagnosis not present

## 2023-11-29 DIAGNOSIS — R1013 Epigastric pain: Secondary | ICD-10-CM | POA: Diagnosis not present

## 2023-11-29 DIAGNOSIS — D123 Benign neoplasm of transverse colon: Secondary | ICD-10-CM | POA: Diagnosis not present

## 2023-11-29 DIAGNOSIS — K319 Disease of stomach and duodenum, unspecified: Secondary | ICD-10-CM | POA: Diagnosis not present

## 2023-11-29 DIAGNOSIS — K2289 Other specified disease of esophagus: Secondary | ICD-10-CM | POA: Diagnosis not present

## 2023-11-29 DIAGNOSIS — R1310 Dysphagia, unspecified: Secondary | ICD-10-CM | POA: Diagnosis not present

## 2023-11-29 DIAGNOSIS — K921 Melena: Secondary | ICD-10-CM | POA: Diagnosis not present

## 2023-11-29 DIAGNOSIS — K2970 Gastritis, unspecified, without bleeding: Secondary | ICD-10-CM | POA: Diagnosis not present

## 2023-12-01 DIAGNOSIS — D122 Benign neoplasm of ascending colon: Secondary | ICD-10-CM | POA: Diagnosis not present

## 2023-12-01 DIAGNOSIS — D123 Benign neoplasm of transverse colon: Secondary | ICD-10-CM | POA: Diagnosis not present

## 2023-12-01 DIAGNOSIS — K319 Disease of stomach and duodenum, unspecified: Secondary | ICD-10-CM | POA: Diagnosis not present

## 2023-12-01 DIAGNOSIS — K2289 Other specified disease of esophagus: Secondary | ICD-10-CM | POA: Diagnosis not present

## 2023-12-02 ENCOUNTER — Encounter (INDEPENDENT_AMBULATORY_CARE_PROVIDER_SITE_OTHER): Payer: Self-pay

## 2023-12-02 ENCOUNTER — Ambulatory Visit (INDEPENDENT_AMBULATORY_CARE_PROVIDER_SITE_OTHER): Payer: Medicare HMO | Admitting: Otolaryngology

## 2023-12-02 VITALS — BP 111/67 | HR 62 | Ht 65.5 in | Wt 151.0 lb

## 2023-12-02 DIAGNOSIS — R0982 Postnasal drip: Secondary | ICD-10-CM | POA: Diagnosis not present

## 2023-12-02 DIAGNOSIS — H9190 Unspecified hearing loss, unspecified ear: Secondary | ICD-10-CM | POA: Diagnosis not present

## 2023-12-02 DIAGNOSIS — J328 Other chronic sinusitis: Secondary | ICD-10-CM

## 2023-12-02 DIAGNOSIS — R1319 Other dysphagia: Secondary | ICD-10-CM

## 2023-12-02 DIAGNOSIS — R0981 Nasal congestion: Secondary | ICD-10-CM | POA: Diagnosis not present

## 2023-12-02 DIAGNOSIS — F17200 Nicotine dependence, unspecified, uncomplicated: Secondary | ICD-10-CM

## 2023-12-02 MED ORDER — FLUTICASONE PROPIONATE 50 MCG/ACT NA SUSP: 2.0000 | Freq: Two times a day (BID) | NASAL | 6 refills | Status: DC

## 2023-12-02 MED ORDER — AZELASTINE HCL 0.1 % NA SOLN: 2.0000 | Freq: Two times a day (BID) | NASAL | 12 refills | Status: DC

## 2023-12-02 NOTE — Patient Instructions (Signed)
 I have ordered an imaging study for you to complete prior to your next visit. Please call [facility] Scheduling at [phone] to schedule your imaging if you have not received a call within 24 hours. If you are unable to complete your imaging study prior to your next scheduled visit please call our office to let us know.  Use flonase two sprays each nostril twice daily; right after, use astelin two sprays each nostril twice daily Lloyd [facility]er Med Nasal Saline Rinse  - start nasal saline rinses with NeilMed Bottle available over the counter    Nasal Saline Irrigation instructions: If you choose to make your own salt water solution, You will need: Salt (kosher, canning, or pickling salt) Baking soda Nasal irrigation bottle (i.e. Lloyd Huger Med Sinus Rinse) Measuring spoon ( teaspoon) Distilled / boiled water   Mix solution Mix 1 teaspoon of salt, 1/2 teaspoon of baking soda and 1 cup of water into irrigation bottle ** May use saline packet instead of homemade recipe for this step if you prefer If medicine was prescribed to be mixed with solution, place this into bottle Examples 2 inches of 2% mupirocin ointment Budesonide solution Position your head: Lean over sink (about 45 degrees) Rotate head (about 45 degrees) so that one nostril is above the other Irrigate Insert tip of irrigation bottle into upper nostril so it forms a comfortable seal Irrigate while breathing through your mouth May remove the straw from the bottle in order to irrigate the entire solution (important if medicine was added) Exhale through nose when finished and blow nose as necessary  Repeat on opposite side with other 1/2 of solution (120 mL) or remake solution if all 240 mL was used on first side Wash irrigation bottle regularly, replace every 3 months

## 2023-12-02 NOTE — Progress Notes (Signed)
 Dear Dr. Lewayne Records, Here is my assessment for our mutual patient, [name]. Thank you for allowing me the opportunity to care for your patient. Please do not hesitate to contact me should you have any other questions. Sincerely, Dr. Milon Aloe  Otolaryngology Clinic Note  HISTORY: [name] is a 58 y.o. female kindly referred by Dr. Lewayne Records for evaluation of frequent sinus issues and dysphagia.   She reports chronic sinus issues, primarily noting nasal congestion, PND, and some bilateral max facial pressure; some frequent sinus infections, some improvement with abx. Current symptoms include PND, congestion, and some facial pressure; she denies typical AR sx, but has had allergy testing with Dr. Idolina Maker, which was negative. She has tried decongestants, PO antihistamine, and flonase, with some benefit from flonase  No previous sinonasal surgery. Of note, she sees Dr. Jorizzo and has had numerous skin related issues including urticaria  In addition, she has also noted dysphagia, choking on sips of water; recent endoscopy with GI noted some esophagitis reported; she has had a Heimlich once (choked on saliva and then sips of water) and also reports that she has lost 35 lbs Patient otherwise denies: - odynophagia, aspiration episodes or PNA - changes in voice, shortness of breath, hemoptysis - ear pain, neck masses  Of note, she also reports subjetive heraing loss but denies any fullness, vertigo, drainage; prior ear surgery. She is interested in getting an audiogram  GLP-1: no AP/AC: no  Tobacco: former, quit  PMHx: HTN, CAD, GERD, Hypothyroidism, Addison's disease, Urticaria  RADIOGRAPHIC EVALUATION AND INDEPENDENT REVIEW OF OTHER RECORDS:: Dr. Idolina Maker notes (10/2023) Allergy -reviewed and summariezd above; noted pruritic and chronic urticaria; chronic rhinitis Skin testing (11/02/2023): + as below, otherwise negative  Labs:  RAST 10/14/2023: negative; Sed rate 10/14/2023: wnl; CMP  10/14/2023: BUN/Cr 1.21/17; CRP <1 CBC 09/15/2023: WBC 9.6, Eos 200 CTH 01/2021 independently interpreted with respect to sinuses: right concha bullosa, left septal deviation; right > left ITH; no significant paransal sinus disease EGD 12/22/2022: no stricture noted Past Medical History:  Diagnosis Date   Addison's disease (HCC)    Addison's disease (HCC)    Adrenal insufficiency (HCC)    diagnosed 2012   Aneurysm (HCC)    Anxiety    Arthritis    Astigmatism    CAD (coronary artery disease)    Cath 2008 EF normal. RCA 50-60, Septal 50%. Myoview 3/12: EF 53% normal perfusion   Cardiac arrest (HCC)    2/2 adissonian crisis   Cardiomyopathy    resolved   Chest pain    chronicc   CHF (congestive heart failure) (HCC)    Chronic back pain    Chronic diarrhea    Concussion    sept 28th 2014   Family history of breast cancer    Family history of colon cancer    Gastroparesis    GERD (gastroesophageal reflux disease)    Headache    migraines   HTN (hypertension)    Hyperlipidemia    Hypothyroidism    Mitral valve prolapse    Nondiabetic gastroparesis    PONV (postoperative nausea and vomiting)    Pre-diabetes    QT prolongation    Stroke [facility])    possible stroke 01/2021   Tobacco abuse    down to 2 cigarettes per day   Vertigo    Past Surgical History:  Procedure Laterality Date   ABDOMINAL HYSTERECTOMY     BIOPSY  12/22/2022   Procedure: BIOPSY;  Surgeon: Evangeline Hilts, MD;  Location:  [facility];  Service: Gastroenterology;;   CARDIAC CATHETERIZATION  03/07/2007   showed 60% lesion in the right coronary artery   CHOLECYSTECTOMY     COLONOSCOPY     ESOPHAGOGASTRODUODENOSCOPY (EGD) WITH PROPOFOL Bilateral 12/22/2022   Procedure: ESOPHAGOGASTRODUODENOSCOPY (EGD) WITH PROPOFOL;  Surgeon: Evangeline Hilts, MD;  Location: [facility];  Service: Gastroenterology;  Laterality: Bilateral;  forward scope and side view   EUS Bilateral 12/22/2022   Procedure: UPPER ENDOSCOPIC  ULTRASOUND (EUS) LINEAR;  Surgeon: Evangeline Hilts, MD;  Location: [facility];  Service: Gastroenterology;  Laterality: Bilateral;   LEFT HEART CATHETERIZATION WITH CORONARY ANGIOGRAM N/A 04/13/2012   Procedure: LEFT HEART CATHETERIZATION WITH CORONARY ANGIOGRAM;  Surgeon: Kristopher Pheasant, MD;  Location: [facility] CATH LAB;  Service: Cardiovascular;  Laterality: N/A;   LUMBAR LAMINECTOMY/DECOMPRESSION MICRODISCECTOMY Left 12/20/2018   Procedure: Left Lumbar Three-Four Lumbar Laminotomy and Foraminotomy, Lumbar Four-Five Laminotomy and Foraminotomy with Microdiscectomy and Resection of Synovial Cyst;  Surgeon: Yvonna Herder, MD;  Location: [facility] OR;  Service: Neurosurgery;  Laterality: Left;  Left Lumbar 3-4 Lumbar laminotomy, foraminotomy, possible microdiscectomy with possible resection of synovial cyst   SPINE SURGERY     VARICOSE VEIN SURGERY     VESICOVAGINAL FISTULA CLOSURE W/ TAH     Family History  Problem Relation Age of Onset   Cancer Father        Colon   Coronary artery disease Other    Heart attack Mother        d. 70   Breast cancer Paternal Uncle 68   Dementia Maternal Grandmother    Leukemia Maternal Uncle 1   Social History   Tobacco Use   Smoking status: Former    Current packs/day: 0.00    Average packs/day: 0.3 packs/day for 10.0 years (2.5 ttl pk-yrs)    Types: Cigarettes    Start date: 01/26/2011    Quit date: 01/25/2021    Years since quitting: 2.8   Smokeless tobacco: Never   Tobacco comments:    smokes 2 a day  Substance Use Topics   Alcohol use: No   Allergies  Allergen Reactions   Bee Venom Anaphylaxis   Erythromycin Other (See Comments) and Nausea And Vomiting    Due to Pre-Existing conditions involved with stomach, patient does not take the following medication   Ibuprofen Nausea Only    gastroparesis    Penicillins Anaphylaxis and Swelling    Has patient had a PCN reaction causing immediate rash, facial/tongue/throat swelling, SOB or lightheadedness  with hypotension: Yes Has patient had a PCN reaction causing severe rash involving mucus membranes or skin necrosis: Yes Has patient had a PCN reaction that required hospitalization No Has patient had a PCN reaction occurring within the last 10 years: No If all of the above answers are "NO", then may proceed with Cephalosporin use.    Sulfa Antibiotics Hives and Itching    Pt states she can take now as an adult  Other Reaction(s): Hives, Tongue swelling, Vomiting   Cat Dander Hives, Itching and Swelling    SWELLING REACTION UNSPECIFIED    Dust Mite Extract Hives and Swelling   Tramadol Diarrhea   Bactrim [Sulfamethoxazole-Trimethoprim]     Pt states she was allergic as a child, but can take now    Current Outpatient Medications  Medication Sig Dispense Refill   alendronate (FOSAMAX) 70 MG tablet Take 70 mg by mouth once a week. Take with a full glass of water on an empty stomach On Mondays  ALPRAZolam (XANAX) 0.5 MG tablet Take 0.5 mg by mouth 3 (three) times daily as needed for anxiety.     atorvastatin (LIPITOR) 40 MG tablet TAKE 1 TABLET (40 MG TOTAL) BY MOUTH DAILY. PLEASE KEEP UPCOMING APPOINTMENT FOR FURTHER REFILLS. 90 tablet 1   azelastine (ASTELIN) 0.1 % nasal spray Place 2 sprays into both nostrils 2 (two) times daily. Use in each nostril as directed 30 mL 12   buPROPion (WELLBUTRIN SR) 150 MG 12 hr tablet Take 150 mg by mouth daily.     Calcium Carbonate (CALCIUM 500 PO) Take 500 mg by mouth 3 (three) times daily.     carvedilol (COREG) 6.25 MG tablet TAKE 1 TABLET TWICE DAILY WITH A MEAL. MUST KEEP FOLLOW UP VISIT FOR FURTHER REFILLS. 180 tablet 1   Cholecalciferol (VITAMIN D-1000 MAX ST) 25 MCG (1000 UT) tablet Take 1,000 Units by mouth daily.     clobetasol ointment (TEMOVATE) 0.05 % Apply 1 Application topically 2 (two) times daily. DO NOT use on the face. Apply for two weeks maximum at a time. 30 g 1   cyclobenzaprine (FLEXERIL) 10 MG tablet Take 10 mg by mouth 3  (three) times daily as needed for muscle spasms.     dicyclomine (BENTYL) 20 MG tablet Take 20 mg by mouth 3 (three) times daily as needed.     DULoxetine (CYMBALTA) 60 MG capsule Take 1 capsule by mouth daily.     EPINEPHrine 0.3 mg/0.3 mL IJ SOAJ injection Inject 0.3 mg into the muscle as needed for anaphylaxis.     fluticasone (FLONASE) 50 MCG/ACT nasal spray Place 2 sprays into both nostrils in the morning and at bedtime. 16 g 6   gabapentin (NEURONTIN) 600 MG tablet Take 600 mg by mouth in the morning, at noon, in the evening, and at bedtime.     hydrocortisone (CORTEF) 10 MG tablet Take 10-20 mg by mouth See admin instructions. Takes 20 mg by mouth in the morning and 10 mg at bedtime     levothyroxine (SYNTHROID) 75 MCG tablet Take 75 mcg by mouth daily before breakfast.     meclizine (ANTIVERT) 25 MG tablet 1 or 2 tabs PO q8h prn dizziness (Patient taking differently: Take 25 mg by mouth 3 (three) times daily as needed for dizziness.) 15 tablet 0   montelukast (SINGULAIR) 10 MG tablet Take 10 mg by mouth at bedtime.     nitroGLYCERIN (NITROSTAT) 0.4 MG SL tablet Place 1 tablet (0.4 mg total) under the tongue every 5 (five) minutes as needed for chest pain. 25 tablet 4   oxyCODONE 20 MG TABS Take 1 tablet (20 mg total) by mouth every 6 (six) hours as needed for severe pain ((score 7 to 10)). 30 tablet 0   tacrolimus (PROTOPIC) 0.1 % ointment Apply topically 2 (two) times daily as needed. 100 g 3   triamcinolone ointment (KENALOG) 0.1 % Apply 1 Application topically 2 (two) times daily as needed. DO NOT use on the face. Apply a thin layer to affected skin. 454 g 0   trifluoperazine (STELAZINE) 2 MG tablet Take 2 mg by mouth 2 (two) times daily.     zolpidem (AMBIEN) 10 MG tablet Take 10 mg by mouth at bedtime.     No current facility-administered medications for this visit.   BP 111/67 (BP Location: Right Arm, Patient Position: Sitting, Cuff Size: Large)   Pulse 62   Ht 5' 5.5" (1.664 m)    Wt 151 lb (68.5 kg)  SpO2 97%   BMI 24.75 kg/m   PHYSICAL EXAM:  BP 111/67 (BP Location: Right Arm, Patient Position: Sitting, Cuff Size: Large)   Pulse 62   Ht 5' 5.5" (1.664 m)   Wt 151 lb (68.5 kg)   SpO2 97%   BMI 24.75 kg/m    Salient findings:  CN II-XII intact  Bilateral EAC clear and TM intact with well pneumatized middle ear spaces Nose: Anterior rhinoscopy reveals septum deviates left, right > left inferior turbinate hypertrophy.  Nasal endoscopy was indicated to better evaluate the nose and paranasal sinuses, given the patient's history and exam findings, and is detailed below. No lesions of oral cavity/oropharynx No obviously palpable neck masses/lymphadenopathy/thyromegaly No respiratory distress or stridor   PROCEDURE: Diagnostic Nasal Endoscopy Pre-procedure diagnosis: Concern for chronic sinusitis, post nasal drip, nasal congestion Post-procedure diagnosis: same Indication: See pre-procedure diagnosis and physical exam above Complications: None apparent EBL: 0 mL Anesthesia: Lidocaine 4% and topical decongestant was topically sprayed in each nasal cavity  Description of Procedure:  Patient was identified. A rigid 30 degree endoscope was utilized to evaluate the sinonasal cavities, mucosa, sinus ostia and turbinates and septum.  Overall, signs of mucosal inflammation are not noted.  No mucopurulence, polyps, or masses noted.   Right Middle meatus: clear Right SE Recess: clear Left MM: clear Left SE Recess: clear  Photodocumentation was obtained.  Procedure Note Pre-procedure diagnosis:  Dysphagia Post-procedure diagnosis: Same Procedure: Transnasal Fiberoptic Laryngoscopy, CPT 31575 - Mod 25 Indication: dysphagia Complications: None apparent EBL: 0 mL  The procedure was undertaken to further evaluate the patient's complaint of dysphagia, with mirror exam inadequate for appropriate examination due to gag reflex and poor patient tolerance  Procedure:   Patient was identified as correct patient. Verbal consent was obtained. The nose was sprayed with oxymetazoline and 4% lidocaine. The The flexible laryngoscope was passed through the nose to view the nasal cavity, pharynx (oropharynx, hypopharynx) and larynx.  The larynx was examined at rest and during multiple phonatory tasks. Documentation was obtained and reviewed with patient. The scope was removed. The patient tolerated the procedure well.  Findings: The nasal cavity and nasopharynx did not reveal any masses or lesions, mucosa appeared to be without obvious lesions. The tongue base, pharyngeal walls, piriform sinuses, vallecula, epiglottis and postcricoid region are normal in appearance. The visualized portion of the subglottis and proximal trachea is widely patent. The vocal folds are mobile bilaterally. There are no lesions on the free edge of the vocal folds nor elsewhere in the larynx worrisome for malignancy.    Electronically signed by: Evelina Hippo, MD 12/18/2023 5:49 PM    ASSESSMENT:  58 y.o.  with:  1. Other dysphagia   2. Other chronic sinusitis   3. Post-nasal drip   4. Subjective hearing loss    Multiple complaints, including ears, nose and throat complaints. Endo and TFL reassuring; prior has had EGD which did not show a stricture but given her complaints, would recommend an MBS with esophageal follow through; also rpeorting subjective HL so would recommend an audiogram; we will also treat her maximally from sinus standpoint and obtain post-treatment CT  PLAN: We've discussed issues and options today.  We reviewed the nasal endoscopy images together.  The risks, benefits and alternatives were discussed and questions answered.  She has elected to proceed with:  1) Start Flonase BID and Astelin BID 2) Daily Sinus rinses 3) Post-treatment Sinus CT 4) MBS ordreed 5) Audiogram 2) Follow-up in 8 weeks, sooner  as necessary  See below regarding exact medications prescribed  this encounter including dosages and route: Meds ordered this encounter  Medications   fluticasone (FLONASE) 50 MCG/ACT nasal spray    Sig: Place 2 sprays into both nostrils in the morning and at bedtime.    Dispense:  16 g    Refill:  6   azelastine (ASTELIN) 0.1 % nasal spray    Sig: Place 2 sprays into both nostrils 2 (two) times daily. Use in each nostril as directed    Dispense:  30 mL    Refill:  12     Thank you for allowing me the opportunity to care for your patient. Please do not hesitate to contact me should you have any other questions.  Sincerely, Milon Aloe, MD Otolaryngologist (ENT), [facility] Phone: [phone] Fax: [phone]  I have personally spent 63 minutes involved in face-to-face and non-face-to-face activities for this patient on the day of the visit.  Professional time spent excludes any procedures performed but includes the following activities, in addition to those noted in the documentation: preparing to see the patient (review of outside documentation and results), addressing multiple complaints, performing a medically appropriate examination, counseling, ordering medications (astelin, flonase), documenting in the electronic health record, independently interpreting results (CT).     12/18/2023, 5:49 PM

## 2023-12-05 ENCOUNTER — Other Ambulatory Visit (HOSPITAL_COMMUNITY): Payer: Self-pay | Admitting: *Deleted

## 2023-12-05 DIAGNOSIS — R1310 Dysphagia, unspecified: Secondary | ICD-10-CM

## 2023-12-14 ENCOUNTER — Ambulatory Visit (HOSPITAL_COMMUNITY)
Admission: RE | Admit: 2023-12-14 | Discharge: 2023-12-14 | Disposition: A | Discharge status: Home / Self Care | Source: Ambulatory Visit | Attending: Internal Medicine | Admitting: Internal Medicine

## 2023-12-14 ENCOUNTER — Ambulatory Visit (HOSPITAL_COMMUNITY)

## 2023-12-14 DIAGNOSIS — J328 Other chronic sinusitis: Secondary | ICD-10-CM

## 2023-12-14 DIAGNOSIS — R1319 Other dysphagia: Secondary | ICD-10-CM

## 2023-12-29 ENCOUNTER — Encounter (HOSPITAL_COMMUNITY)

## 2024-01-02 DIAGNOSIS — E271 Primary adrenocortical insufficiency: Secondary | ICD-10-CM | POA: Diagnosis not present

## 2024-01-02 DIAGNOSIS — M4802 Spinal stenosis, cervical region: Secondary | ICD-10-CM | POA: Diagnosis not present

## 2024-01-02 DIAGNOSIS — G894 Chronic pain syndrome: Secondary | ICD-10-CM | POA: Diagnosis not present

## 2024-01-02 DIAGNOSIS — F419 Anxiety disorder, unspecified: Secondary | ICD-10-CM | POA: Diagnosis not present

## 2024-01-03 ENCOUNTER — Other Ambulatory Visit (HOSPITAL_COMMUNITY): Payer: Self-pay | Admitting: Internal Medicine

## 2024-01-03 DIAGNOSIS — Z1231 Encounter for screening mammogram for malignant neoplasm of breast: Secondary | ICD-10-CM

## 2024-01-05 ENCOUNTER — Ambulatory Visit (HOSPITAL_COMMUNITY)
Admission: RE | Admit: 2024-01-05 | Discharge: 2024-01-05 | Disposition: A | Discharge status: Home / Self Care | Source: Ambulatory Visit | Attending: Otolaryngology | Admitting: Otolaryngology

## 2024-01-05 ENCOUNTER — Ambulatory Visit (HOSPITAL_COMMUNITY)
Admission: RE | Admit: 2024-01-05 | Discharge: 2024-01-05 | Disposition: A | Discharge status: Home / Self Care | Source: Ambulatory Visit | Attending: Internal Medicine | Admitting: Internal Medicine

## 2024-01-05 DIAGNOSIS — J349 Unspecified disorder of nose and nasal sinuses: Secondary | ICD-10-CM | POA: Diagnosis not present

## 2024-01-05 DIAGNOSIS — K2100 Gastro-esophageal reflux disease with esophagitis, without bleeding: Secondary | ICD-10-CM | POA: Diagnosis not present

## 2024-01-05 DIAGNOSIS — R1319 Other dysphagia: Secondary | ICD-10-CM | POA: Insufficient documentation

## 2024-01-05 DIAGNOSIS — Z981 Arthrodesis status: Secondary | ICD-10-CM | POA: Insufficient documentation

## 2024-01-05 DIAGNOSIS — M542 Cervicalgia: Secondary | ICD-10-CM | POA: Diagnosis not present

## 2024-01-05 DIAGNOSIS — R1310 Dysphagia, unspecified: Secondary | ICD-10-CM

## 2024-01-05 NOTE — Progress Notes (Signed)
 Modified Barium Swallow Study  Patient Details  Name: [name] MRN: [id number] Date of Birth: 1966-04-02  Today's Date: 01/05/2024  Modified Barium Swallow completed.  Full report located under Chart Review in the Imaging Section.  History of Present Illness [name] is a 58 y.o. female referred for OP MBS by ENT; work up related to frequent sinus issues and dysphagia. Pt reported choking on sips of water; recent endoscopy with GI noted some esophagitis reported.   Clinical Impression Pt demonstrates minimal differences in swallow function, but no impairment that impacts safety or efficiency. Pt has a history of ACDF from C5-7; hardware does not impede bolus flow, but PES opening may be slightly reduced as a result. Pt initiates the swallow at the valleculae with small sips, and in the pyriform sinuses with larger sips. Though no aspiration or penetration occurred during this study it is possible that she may have instances of liquids falling prematurely into the airway. Taking small sips and avoiding distractions with eating and drinking could reduce discomfort. Pt expected to protect airway with cough. Esophageal sweep did show signs of dysmotility includign retrograde flow of liquids, confirmed with radiologist. It is possible that pt is coughing on liquid backflow from esophagus in severe situations.  Pt may benefit from further esophageal study if regurgitation or globus become a problem. Factors that may increase risk of adverse event in presence of aspiration Roderick Civatte & Jessy Morocco 2021):    Swallow Evaluation Recommendations Recommendations: PO diet PO Diet Recommendation: Regular;Thin liquids (Level 0) Liquid Administration via: Cup;Straw Medication Administration: Whole meds with liquid Supervision: Patient able to self-feed Swallowing strategies  : Slow rate;Small bites/sips Postural changes: Position pt fully upright for meals;Stay upright 30-60 min after meals Recommended  consults: Consider esophageal assessment      Amarya Kuehl, Hardin Leys 01/05/2024,1:39 PM

## 2024-01-23 DIAGNOSIS — R140 Abdominal distension (gaseous): Secondary | ICD-10-CM | POA: Diagnosis not present

## 2024-01-23 DIAGNOSIS — R109 Unspecified abdominal pain: Secondary | ICD-10-CM | POA: Diagnosis not present

## 2024-01-27 ENCOUNTER — Ambulatory Visit (INDEPENDENT_AMBULATORY_CARE_PROVIDER_SITE_OTHER): Admitting: Audiology

## 2024-01-27 ENCOUNTER — Ambulatory Visit (INDEPENDENT_AMBULATORY_CARE_PROVIDER_SITE_OTHER): Admitting: Otolaryngology

## 2024-01-31 DIAGNOSIS — M4316 Spondylolisthesis, lumbar region: Secondary | ICD-10-CM | POA: Diagnosis not present

## 2024-01-31 DIAGNOSIS — E271 Primary adrenocortical insufficiency: Secondary | ICD-10-CM | POA: Diagnosis not present

## 2024-01-31 DIAGNOSIS — F1120 Opioid dependence, uncomplicated: Secondary | ICD-10-CM | POA: Diagnosis not present

## 2024-01-31 DIAGNOSIS — Z6825 Body mass index (BMI) 25.0-25.9, adult: Secondary | ICD-10-CM | POA: Diagnosis not present

## 2024-01-31 DIAGNOSIS — I10 Essential (primary) hypertension: Secondary | ICD-10-CM | POA: Diagnosis not present

## 2024-01-31 DIAGNOSIS — G894 Chronic pain syndrome: Secondary | ICD-10-CM | POA: Diagnosis not present

## 2024-01-31 DIAGNOSIS — M4802 Spinal stenosis, cervical region: Secondary | ICD-10-CM | POA: Diagnosis not present

## 2024-01-31 DIAGNOSIS — M48061 Spinal stenosis, lumbar region without neurogenic claudication: Secondary | ICD-10-CM | POA: Diagnosis not present

## 2024-01-31 DIAGNOSIS — E273 Drug-induced adrenocortical insufficiency: Secondary | ICD-10-CM | POA: Diagnosis not present

## 2024-02-01 ENCOUNTER — Ambulatory Visit: Payer: Medicare HMO | Admitting: Allergy & Immunology

## 2024-02-08 ENCOUNTER — Encounter (INDEPENDENT_AMBULATORY_CARE_PROVIDER_SITE_OTHER): Payer: Self-pay | Admitting: Otolaryngology

## 2024-02-15 DIAGNOSIS — E271 Primary adrenocortical insufficiency: Secondary | ICD-10-CM | POA: Diagnosis not present

## 2024-02-15 DIAGNOSIS — I129 Hypertensive chronic kidney disease with stage 1 through stage 4 chronic kidney disease, or unspecified chronic kidney disease: Secondary | ICD-10-CM | POA: Diagnosis not present

## 2024-02-15 DIAGNOSIS — N1831 Chronic kidney disease, stage 3a: Secondary | ICD-10-CM | POA: Diagnosis not present

## 2024-02-15 DIAGNOSIS — I340 Nonrheumatic mitral (valve) insufficiency: Secondary | ICD-10-CM | POA: Diagnosis not present

## 2024-02-17 ENCOUNTER — Telehealth: Payer: Self-pay

## 2024-02-17 ENCOUNTER — Ambulatory Visit: Admitting: Allergy & Immunology

## 2024-02-17 ENCOUNTER — Encounter: Payer: Self-pay | Admitting: Allergy & Immunology

## 2024-02-17 ENCOUNTER — Other Ambulatory Visit (HOSPITAL_COMMUNITY): Payer: Self-pay

## 2024-02-17 VITALS — BP 126/76 | HR 64 | Temp 97.9°F | Resp 16 | Wt 164.0 lb

## 2024-02-17 DIAGNOSIS — L299 Pruritus, unspecified: Secondary | ICD-10-CM | POA: Diagnosis not present

## 2024-02-17 DIAGNOSIS — T63481D Toxic effect of venom of other arthropod, accidental (unintentional), subsequent encounter: Secondary | ICD-10-CM | POA: Diagnosis not present

## 2024-02-17 DIAGNOSIS — L508 Other urticaria: Secondary | ICD-10-CM

## 2024-02-17 DIAGNOSIS — J302 Other seasonal allergic rhinitis: Secondary | ICD-10-CM

## 2024-02-17 DIAGNOSIS — J3089 Other allergic rhinitis: Secondary | ICD-10-CM | POA: Diagnosis not present

## 2024-02-17 MED ORDER — TACROLIMUS 0.1 % EX OINT: TOPICAL_OINTMENT | Freq: Two times a day (BID) | CUTANEOUS | 3 refills | Status: AC | PRN

## 2024-02-17 MED ORDER — CLOBETASOL PROPIONATE 0.05 % EX OINT: 1.0000 | TOPICAL_OINTMENT | Freq: Two times a day (BID) | CUTANEOUS | 1 refills | Status: DC

## 2024-02-17 MED ORDER — TRIAMCINOLONE ACETONIDE 0.1 % EX OINT: 1.0000 | TOPICAL_OINTMENT | Freq: Two times a day (BID) | CUTANEOUS | 0 refills | Status: AC | PRN

## 2024-02-17 MED ORDER — AZELASTINE-FLUTICASONE 137-50 MCG/ACT NA SUSP: 2.0000 | NASAL | 5 refills | Status: DC

## 2024-02-17 MED ORDER — PREDNISONE 10 MG PO TABS: ORAL_TABLET | ORAL | 0 refills | Status: DC

## 2024-02-17 NOTE — Patient Instructions (Addendum)
 1. Pruritic and chronic urticaria - Continue to avoid red meat. - I still think that starting Nemluvio would be the best route to take.  - [name] reached out to you in March and left a voicemail, but just give her a call about the Nemluvio: [phone] - Continue with as needed: tacrolimus  twice daily (safe to use on the face), triamcinolone  twice daily (do NOT use on the face), and clobetasol  twice daily (do NOT use on the face and use for only two weeks at a time).   2. Insect sting allergy  - Stinging insect panel was negative. - EpiPen  is up to date.  - But I would only do the skin testing if you were thinking of starting venom allergy  shots.   3. Perennial and seasonal allergic rhinitis (trees, dog) - We are going to work on getting Dymista approved (this combines the Flonase  and the Astelin  into one spray). - Do one puff twice daily EVERY DAY to stay ahead of this, although I would give your nose a week to heal before doing this regimen.  - We are going to send in a round of prednisone  to help with your nasal symptoms.  - Try using Ayr nasal saline gel to help the nose heal.    4. Return in about 6 months (around 08/18/2024). You can have the follow up appointment with Dr. Idolina Maker or a Nurse Practicioner (our Nurse Practitioners are excellent and always have Physician oversight!).    Please inform us  of any Emergency Department visits, hospitalizations, or changes in symptoms. Call us  before going to the ED for breathing or allergy  symptoms since we might be able to fit you in for a sick visit. Feel free to contact us  anytime with any questions, problems, or concerns.  It was a pleasure to see you again today!  Websites that have reliable patient information: 1. American Academy of Asthma, Allergy , and Immunology: www.aaaai.org 2. Food Allergy  Research and Education (FARE): foodallergy.org 3. Mothers of Asthmatics: http://www.asthmacommunitynetwork.org 4. [employer] of  Allergy , Asthma, and Immunology: www.acaai.org      "Like" us  on Facebook and Instagram for our latest updates!      A healthy democracy works best when [employer] participate! Make sure you are registered to vote! If you have moved or changed any of your contact information, you will need to get this updated before voting! Scan the QR codes below to learn more!

## 2024-02-17 NOTE — Telephone Encounter (Signed)
*  Asthma/Allergy   Pharmacy Patient Advocate Encounter   Received notification from CoverMyMeds that prior authorization for Azelastine -Fluticasone  137-50MCG/ACT suspension  is required/requested.   Insurance verification completed.   The patient is insured through [employer] [place] .   Per test claim: PA required; PA submitted to above mentioned insurance via CoverMyMeds Key/confirmation #/EOC [id number] Status is pending

## 2024-02-17 NOTE — Progress Notes (Signed)
 FOLLOW UP  Date of Service/Encounter:  02/17/24   Assessment:   Pruritic condition - followed by Dr. Jorrizzo (next appt March 19th), starting [facility]   Insect sting allergy  - getting stinging insect panel   Chronic rhinitis  - with testing positive to tree mix and dog   Mold exposure in her home  Recent SIBO - s/p course of ciprofloxacin   Stage 3 CKD   Plan/Recommendations:   1. Pruritic and chronic urticaria - Continue to avoid red meat. - I still think that starting Nemluvio would be the best route to take.  - [name] reached out to you in March and left a voicemail, but just give her a call about the Nemluvio: [phone] - Continue with as needed: tacrolimus  twice daily (safe to use on the face), triamcinolone  twice daily (do NOT use on the face), and clobetasol  twice daily (do NOT use on the face and use for only two weeks at a time).   2. Insect sting allergy  - Stinging insect panel was negative. - EpiPen  is up to date.  - But I would only do the skin testing if you were thinking of starting venom allergy  shots.   3. Perennial and seasonal allergic rhinitis (trees, dog) - We are going to work on getting Dymista approved (this combines the Flonase  and the Astelin  into one spray). - Do one puff twice daily EVERY DAY to stay ahead of this, although I would give your nose a week to heal before doing this regimen.  - We are going to send in a round of prednisone  to help with your nasal symptoms.  - Try using Ayr nasal saline gel to help the nose heal.   4. Return in about 6 months (around 08/18/2024). You can have the follow up appointment with Dr. Idolina Maker or a Nurse Practicioner (our Nurse Practitioners are excellent and always have Physician oversight!).   Subjective:   [name] is a 58 y.o. female presenting today for follow up of  Chief Complaint  Patient presents with   Follow-up    Interested in metal testing     [name] has a history of the  following: Patient Active Problem List   Diagnosis Date Noted   S/P lumbar fusion 03/18/2021   Acute CVA/punctate acute or subacute infarction in the lower Lt [facility] Mid Brain 01/26/2021   Acute metabolic encephalopathy 01/26/2021   Aphasia 01/25/2021   Multiple falls 01/25/2021   Genetic testing 12/31/2019   AKI (acute kidney injury) (HCC) 08/06/2019   Emesis, persistent 08/06/2019   Viral syndrome 08/05/2019   Tobacco abuse    History of colonic polyps 07/23/2019   Family history of breast cancer    Family history of colon cancer    Synovial cyst of lumbar spine 12/20/2018   Degeneration of lumbar intervertebral disc 12/01/2018   Myositis 05/11/2016   Anxiety 05/26/2015   Tremors of nervous system 05/26/2015   Neck pain 05/26/2015   Hot flushes, perimenopausal 07/24/2013   CAD (coronary artery disease) 04/11/2012   GERD (gastroesophageal reflux disease) 04/11/2012   Addison's disease (HCC) 03/03/2012   QT prolongation 12/15/2010   Palpitations 01/01/2009   Hypothyroidism 12/31/2008   Hyperlipidemia 12/31/2008   OVERWEIGHT/OBESITY 12/31/2008   Essential hypertension 12/31/2008    History obtained from: chart review and patient.  Discussed the use of AI scribe software for clinical note transcription with the patient and/or guardian, who gave verbal consent to proceed.  [name] is a 58 y.o. female presenting  for a follow up visit.  She was last seen in February 2025.  At that time, we recommended continued avoidance of red meat.  She did decide to start Nemluvio.  She was continued on tacrolimus , triamcinolone , and clobetasol .  She had a stinging insect panel that was negative.  We talked about doing skin testing, but she preferred just to keep an EpiPen  on board.  For her rhinitis, she had testing that was positive to tree mix and dog mix in the past.  Since last visit, she has not done well.  She has a history of bacterial overgrowth in her stomach, diagnosed after six  months via a breath test. She was treated with ciprofloxacin , 500 mg twice daily for 14 days. While the treatment initially alleviated symptoms, they returned towards the end of the course, including stomach pain and spasms.  She has a history of Addison's disease and was previously on prednisone , which improved her symptoms. She reports losing 35 pounds unintentionally and is concerned about continued weight loss despite being at a comfortable weight now.  She mentions issues with her living environment, including mold and mildew, which she has cleaned multiple times with bleach. She suspects this may be contributing to her symptoms. She also reports lead in the copper  pipes of her apartment and has had lead detected in her blood.  Allergic Rhinitis Symptom History: She experiences sinus issues, describing a burning and raw sensation in her nose, primarily on the left side. She has been using Flonase  and azelastine  nasal spray, which initially helped but later caused burning. She also reports a sensation of a sore deep in her nose, exacerbated by nasal sprays, and uses salt water rinses to alleviate symptoms.  She has a sinus CT scheduled for the near future. She has had difficulty with scheduling and attending medical appointments, including a missed ENT appointment and confusion over a hearing test and CT scan scheduling. She is awaiting a CT scan of her sinuses and a hearing test.    She saw Dr. Lydia Sams with ENT on March 28.  At that time, he did a diagnostic endoscopy of her nasal cavity.  This was largely normal with no evidence of inflammation, no mucopurulence, no polyps, and no masses.  It was recommended that she undergo a modified barium swallow with esophageal follow-up.[name]  He also recommended doing hearing test in the CT of her sinuses, did not think any of.  She was continued on Flonase  and azelastine .  Skin Symptom History: She experiences intense skin itching, particularly on her nose, and  perceives the presence of 'bugs' or 'fleas' that seem to jump. Initially, she noticed these symptoms on her daughter and later observed larvae-like spots on her neck.  She does not remember getting a call from [name] about the Nemluvio.  She is open to getting our call to see how she can start this medication again..  She does mention something at the end of the visit about heavy metal testing, but I did inform her that routinely ordered anything like that.  I was not sure what would have ordered when wanted to pursue that.  Otherwise, there have been no changes to her past medical history, surgical history, family history, or social history.    Review of systems otherwise negative other than that mentioned in the HPI.    Objective:   Blood pressure 126/76, pulse 64, temperature 97.9 F (36.6 C), resp. rate 16, weight 164 lb (74.4 kg), SpO2 95%. Body mass index  is 26.88 kg/m.    Physical Exam Vitals reviewed.  Constitutional:      Appearance: She is well-developed.     Comments: Very anxious.  HENT:     Head: Normocephalic and atraumatic.     Right Ear: Tympanic membrane, ear canal and external ear normal. No drainage, swelling or tenderness. Tympanic membrane is not injected, scarred, erythematous, retracted or bulging.     Left Ear: Tympanic membrane, ear canal and external ear normal. No drainage, swelling or tenderness. Tympanic membrane is not injected, scarred, erythematous, retracted or bulging.     Nose: No nasal deformity, septal deviation, mucosal edema or rhinorrhea.     Right Sinus: No maxillary sinus tenderness or frontal sinus tenderness.     Left Sinus: No maxillary sinus tenderness or frontal sinus tenderness.     Mouth/Throat:     Mouth: Mucous membranes are not pale and not dry.     Pharynx: Uvula midline.   Eyes:     General:        Right eye: No discharge.        Left eye: No discharge.     Conjunctiva/sclera: Conjunctivae normal.     Right eye: Right  conjunctiva is not injected. No chemosis.    Left eye: Left conjunctiva is not injected. No chemosis.    Pupils: Pupils are equal, round, and reactive to light.    Cardiovascular:     Rate and Rhythm: Normal rate and regular rhythm.     Heart sounds: Normal heart sounds.  Pulmonary:     Effort: Pulmonary effort is normal. No tachypnea, accessory muscle usage or respiratory distress.     Breath sounds: Normal breath sounds. No wheezing, rhonchi or rales.  Chest:     Chest wall: No tenderness.  Abdominal:     Tenderness: There is no abdominal tenderness. There is no guarding or rebound.  Lymphadenopathy:     Head:     Right side of head: No submandibular, tonsillar or occipital adenopathy.     Left side of head: No submandibular, tonsillar or occipital adenopathy.     Cervical: No cervical adenopathy.   Skin:    General: Skin is warm.     Capillary Refill: Capillary refill takes less than 2 seconds.     Coloration: Skin is not pale.     Findings: Rash present. No abrasion, erythema or petechiae. Rash is not papular, urticarial or vesicular.     Comments: Multiple excoriations noted over her entire body. Some nodules on the arm consistent with prurigo nodularis noted. No urticaria.  No oozing.  No honey crusting.   Neurological:     Mental Status: She is alert.   Psychiatric:        Behavior: Behavior is cooperative.      Diagnostic studies: none       Drexel Gentles, MD  [facility] 

## 2024-02-17 NOTE — Telephone Encounter (Signed)
 Approved today by [facility] NCPDP [id number] Your request has been approved Effective Date: 09/07/2023 Authorization Expiration Date: 09/05/2024

## 2024-02-19 ENCOUNTER — Ambulatory Visit (HOSPITAL_COMMUNITY)
Admission: RE | Admit: 2024-02-19 | Discharge: 2024-02-19 | Disposition: A | Discharge status: Home / Self Care | Source: Ambulatory Visit | Attending: Otolaryngology | Admitting: Otolaryngology

## 2024-02-19 DIAGNOSIS — R1319 Other dysphagia: Secondary | ICD-10-CM | POA: Diagnosis not present

## 2024-02-19 DIAGNOSIS — J342 Deviated nasal septum: Secondary | ICD-10-CM | POA: Diagnosis not present

## 2024-02-19 DIAGNOSIS — J328 Other chronic sinusitis: Secondary | ICD-10-CM | POA: Insufficient documentation

## 2024-02-19 DIAGNOSIS — J3489 Other specified disorders of nose and nasal sinuses: Secondary | ICD-10-CM | POA: Diagnosis not present

## 2024-02-19 DIAGNOSIS — J329 Chronic sinusitis, unspecified: Secondary | ICD-10-CM | POA: Diagnosis not present

## 2024-02-23 ENCOUNTER — Telehealth (INDEPENDENT_AMBULATORY_CARE_PROVIDER_SITE_OTHER): Payer: Self-pay | Admitting: Otolaryngology

## 2024-02-23 NOTE — Telephone Encounter (Signed)
 Patient called in and stated she had imaging done on Sunday.  However, she is having extreme sinus pain.  Please advise.

## 2024-02-24 ENCOUNTER — Telehealth (INDEPENDENT_AMBULATORY_CARE_PROVIDER_SITE_OTHER): Payer: Self-pay | Admitting: Otolaryngology

## 2024-02-24 NOTE — Telephone Encounter (Signed)
 Patient LVM stating that she is concerned.  Feels like a large blood clot maybe trying to come out of her nose, she later went on to say that it may be a leech stuck up in there.  In pain and concerned.  Please advise.

## 2024-02-27 DIAGNOSIS — R202 Paresthesia of skin: Secondary | ICD-10-CM | POA: Diagnosis not present

## 2024-02-29 DIAGNOSIS — M353 Polymyalgia rheumatica: Secondary | ICD-10-CM | POA: Diagnosis not present

## 2024-02-29 DIAGNOSIS — M1991 Primary osteoarthritis, unspecified site: Secondary | ICD-10-CM | POA: Diagnosis not present

## 2024-02-29 DIAGNOSIS — M4802 Spinal stenosis, cervical region: Secondary | ICD-10-CM | POA: Diagnosis not present

## 2024-02-29 DIAGNOSIS — J0100 Acute maxillary sinusitis, unspecified: Secondary | ICD-10-CM | POA: Diagnosis not present

## 2024-03-01 ENCOUNTER — Encounter (INDEPENDENT_AMBULATORY_CARE_PROVIDER_SITE_OTHER): Payer: Self-pay | Admitting: Otolaryngology

## 2024-03-01 ENCOUNTER — Ambulatory Visit (INDEPENDENT_AMBULATORY_CARE_PROVIDER_SITE_OTHER): Admitting: Otolaryngology

## 2024-03-01 VITALS — BP 137/82 | HR 69 | Ht 65.5 in | Wt 160.0 lb

## 2024-03-01 DIAGNOSIS — J328 Other chronic sinusitis: Secondary | ICD-10-CM

## 2024-03-01 DIAGNOSIS — H9190 Unspecified hearing loss, unspecified ear: Secondary | ICD-10-CM

## 2024-03-01 DIAGNOSIS — R0981 Nasal congestion: Secondary | ICD-10-CM | POA: Diagnosis not present

## 2024-03-01 DIAGNOSIS — R1319 Other dysphagia: Secondary | ICD-10-CM

## 2024-03-01 DIAGNOSIS — R0982 Postnasal drip: Secondary | ICD-10-CM | POA: Diagnosis not present

## 2024-03-01 NOTE — Progress Notes (Signed)
 Dear Dr. Bertell, Here is my assessment for our mutual patient, [name]. Thank you for allowing me the opportunity to care for your patient. Please do not hesitate to contact me should you have any other questions. Sincerely, Dr. Eldora Blanch  Otolaryngology Clinic Note  HISTORY: [name] is a 58 y.o. female kindly referred by Dr. Bertell for evaluation of frequent sinus issues and dysphagia.   Initial visit: She reports chronic sinus issues, primarily noting nasal congestion, PND, and some bilateral max facial pressure; some frequent sinus infections, some improvement with abx. Current symptoms include PND, congestion, and some facial pressure; she denies typical AR sx, but has had allergy  testing with Dr. Iva, which was negative. She has tried decongestants, PO antihistamine, and flonase , with some benefit from flonase   No previous sinonasal surgery. Of note, she sees Dr. Jorizzo and has had numerous skin related issues including urticaria  In addition, she has also noted dysphagia, choking on sips of water; recent endoscopy with GI noted some esophagitis reported; she has had a Heimlich once (choked on saliva and then sips of water) and also reports that she has lost 35 lbs Patient otherwise denies: - odynophagia, aspiration episodes or PNA - changes in voice, shortness of breath, hemoptysis - ear pain, neck masses  Of note, she also reports subjetive heraing loss but denies any fullness, vertigo, drainage; prior ear surgery. She is interested in getting an audiogram  --------------------------------------------------------- 03/01/2024 Seen in follow up with CT. Reports that during her visit, she did have sinus pain and pressure (max, frontal). Otherwise reports that her nose feels dry and has been itching with left nostril lesions. Thus, she has been rubbing her nose quite a lot and has noted low volume bleeding intermittently. She reports these have been getting better. MBS  reviewed and discussed. No regurgitation. Upcoming HT.  GLP-1: no AP/AC: no  Tobacco: former, quit  PMHx: HTN, CAD, GERD, Hypothyroidism, Addison's disease, Urticaria  RADIOGRAPHIC EVALUATION AND INDEPENDENT REVIEW OF OTHER RECORDS:: Dr. Iva notes (10/2023) Allergy  -reviewed and summariezd above; noted pruritic and chronic urticaria; chronic rhinitis Skin testing (11/02/2023): + as below, otherwise negative  Labs:  RAST 10/14/2023: negative; Sed rate 10/14/2023: wnl; CMP 10/14/2023: BUN/Cr 1.21/17; CRP <1 CBC 09/15/2023: WBC 9.6, Eos 200 CTH 01/2021 independently interpreted with respect to sinuses: right concha bullosa, left septal deviation; right > left ITH; no significant paransal sinus disease EGD 12/22/2022: no stricture noted  CT Sinus (02/19/2024) independently interpreted: left septal deviation, right concha bullosa, paransaal sinuses well aerated;  MBS 01/05/2024: noted no aspiration, mild CP narrowing; some dysmotility Past Medical History:  Diagnosis Date   Addison's disease (HCC)    Addison's disease (HCC)    Adrenal insufficiency (HCC)    diagnosed 2012   Aneurysm (HCC)    Anxiety    Arthritis    Astigmatism    CAD (coronary artery disease)    Cath 2008 EF normal. RCA 50-60, Septal 50%. Myoview  3/12: EF 53% normal perfusion   Cardiac arrest (HCC)    2/2 adissonian crisis   Cardiomyopathy    resolved   Chest pain    chronicc   CHF (congestive heart failure) (HCC)    Chronic back pain    Chronic diarrhea    Concussion    sept 28th 2014   Family history of breast cancer    Family history of colon cancer    Gastroparesis    GERD (gastroesophageal reflux disease)    Headache    migraines  HTN (hypertension)    Hyperlipidemia    Hypothyroidism    Mitral valve prolapse    Nondiabetic gastroparesis    PONV (postoperative nausea and vomiting)    Pre-diabetes    QT prolongation    Stroke [facility])    possible stroke 01/2021   Tobacco abuse    down to 2 cigarettes  per day   Vertigo    Past Surgical History:  Procedure Laterality Date   ABDOMINAL HYSTERECTOMY     BIOPSY  12/22/2022   Procedure: BIOPSY;  Surgeon: Burnette Fallow, MD;  Location: [facility];  Service: Gastroenterology;;   CARDIAC CATHETERIZATION  03/07/2007   showed 60% lesion in the right coronary artery   CHOLECYSTECTOMY     COLONOSCOPY     ESOPHAGOGASTRODUODENOSCOPY (EGD) WITH PROPOFOL  Bilateral 12/22/2022   Procedure: ESOPHAGOGASTRODUODENOSCOPY (EGD) WITH PROPOFOL ;  Surgeon: Burnette Fallow, MD;  Location: [facility];  Service: Gastroenterology;  Laterality: Bilateral;  forward scope and side view   EUS Bilateral 12/22/2022   Procedure: UPPER ENDOSCOPIC ULTRASOUND (EUS) LINEAR;  Surgeon: Burnette Fallow, MD;  Location: [facility];  Service: Gastroenterology;  Laterality: Bilateral;   LEFT HEART CATHETERIZATION WITH CORONARY ANGIOGRAM N/A 04/13/2012   Procedure: LEFT HEART CATHETERIZATION WITH CORONARY ANGIOGRAM;  Surgeon: Debby JONETTA Como, MD;  Location: [facility] CATH LAB;  Service: Cardiovascular;  Laterality: N/A;   LUMBAR LAMINECTOMY/DECOMPRESSION MICRODISCECTOMY Left 12/20/2018   Procedure: Left Lumbar Three-Four Lumbar Laminotomy and Foraminotomy, Lumbar Four-Five Laminotomy and Foraminotomy with Microdiscectomy and Resection of Synovial Cyst;  Surgeon: Alix Charleston, MD;  Location: [facility] OR;  Service: Neurosurgery;  Laterality: Left;  Left Lumbar 3-4 Lumbar laminotomy, foraminotomy, possible microdiscectomy with possible resection of synovial cyst   SPINE SURGERY     VARICOSE VEIN SURGERY     VESICOVAGINAL FISTULA CLOSURE W/ TAH     Family History  Problem Relation Age of Onset   Cancer Father        Colon   Coronary artery disease Other    Heart attack Mother        d. 62   Breast cancer Paternal Uncle 28   Dementia Maternal Grandmother    Leukemia Maternal Uncle 1   Social History   Tobacco Use   Smoking status: Former    Current packs/day: 0.00    Average  packs/day: 0.3 packs/day for 10.0 years (2.5 ttl pk-yrs)    Types: Cigarettes    Start date: 01/26/2011    Quit date: 01/25/2021    Years since quitting: 3.0   Smokeless tobacco: Never   Tobacco comments:    smokes 2 a day  Substance Use Topics   Alcohol use: No   Allergies  Allergen Reactions   Bee Venom Anaphylaxis   Erythromycin Other (See Comments) and Nausea And Vomiting    Due to Pre-Existing conditions involved with stomach, patient does not take the following medication   Ibuprofen  Nausea Only    gastroparesis    Penicillins Anaphylaxis and Swelling    Has patient had a PCN reaction causing immediate rash, facial/tongue/throat swelling, SOB or lightheadedness with hypotension: Yes Has patient had a PCN reaction causing severe rash involving mucus membranes or skin necrosis: Yes Has patient had a PCN reaction that required hospitalization No Has patient had a PCN reaction occurring within the last 10 years: No If all of the above answers are NO, then may proceed with Cephalosporin use.    Sulfa Antibiotics Hives and Itching    Pt states she can take now as an  adult  Other Reaction(s): Hives, Tongue swelling, Vomiting   Cat Dander Hives, Itching and Swelling    SWELLING REACTION UNSPECIFIED    Dust Mite Extract Hives and Swelling   Tramadol Diarrhea   Bactrim [Sulfamethoxazole-Trimethoprim]     Pt states she was allergic as a child, but can take now    Current Outpatient Medications  Medication Sig Dispense Refill   albuterol  (PROVENTIL ) 2 MG tablet Take 2 mg by mouth 3 (three) times daily.     albuterol  (VENTOLIN  HFA) 108 (90 Base) MCG/ACT inhaler Inhale 2 puffs into the lungs.     alendronate (FOSAMAX) 70 MG tablet Take 70 mg by mouth once a week. Take with a full glass of water on an empty stomach On Mondays     ALPRAZolam  (XANAX ) 0.5 MG tablet Take 0.5 mg by mouth 3 (three) times daily as needed for anxiety.     atorvastatin  (LIPITOR) 40 MG tablet TAKE 1 TABLET  (40 MG TOTAL) BY MOUTH DAILY. PLEASE KEEP UPCOMING APPOINTMENT FOR FURTHER REFILLS. 90 tablet 1   Azelastine -Fluticasone  (DYMISTA ) 137-50 MCG/ACT SUSP Place 2 sprays into both nostrils 1 day or 1 dose. 23 g 5   buPROPion  (WELLBUTRIN  SR) 150 MG 12 hr tablet Take 150 mg by mouth daily.     Calcium  Carbonate (CALCIUM  500 PO) Take 500 mg by mouth 3 (three) times daily.     carvedilol  (COREG ) 6.25 MG tablet TAKE 1 TABLET TWICE DAILY WITH A MEAL. MUST KEEP FOLLOW UP VISIT FOR FURTHER REFILLS. 180 tablet 1   Cholecalciferol  (VITAMIN D -1000 MAX ST) 25 MCG (1000 UT) tablet Take 1,000 Units by mouth daily.     clobetasol  ointment (TEMOVATE ) 0.05 % Apply 1 Application topically 2 (two) times daily. DO NOT use on the face. Apply for two weeks maximum at a time. 30 g 1   cyclobenzaprine  (FLEXERIL ) 10 MG tablet Take 10 mg by mouth 3 (three) times daily as needed for muscle spasms.     dicyclomine  (BENTYL ) 20 MG tablet Take 20 mg by mouth 3 (three) times daily as needed.     DULoxetine (CYMBALTA) 60 MG capsule Take 1 capsule by mouth daily.     EPINEPHrine  0.3 mg/0.3 mL IJ SOAJ injection Inject 0.3 mg into the muscle as needed for anaphylaxis.     ferrous sulfate 325 (65 FE) MG EC tablet Take 325 mg by mouth.     gabapentin  (NEURONTIN ) 600 MG tablet Take 600 mg by mouth in the morning, at noon, in the evening, and at bedtime.     hydrocortisone  (CORTEF ) 10 MG tablet Take 10-20 mg by mouth See admin instructions. Takes 20 mg by mouth in the morning and 10 mg at bedtime     meclizine  (ANTIVERT ) 25 MG tablet 1 or 2 tabs PO q8h prn dizziness 15 tablet 0   montelukast (SINGULAIR) 10 MG tablet Take 10 mg by mouth at bedtime.     nitroGLYCERIN  (NITROSTAT ) 0.4 MG SL tablet Place 1 tablet (0.4 mg total) under the tongue every 5 (five) minutes as needed for chest pain. 25 tablet 4   omeprazole (PRILOSEC) 40 MG capsule Take 40 mg by mouth every morning.     oxyCODONE  20 MG TABS Take 1 tablet (20 mg total) by mouth every 6  (six) hours as needed for severe pain ((score 7 to 10)). 30 tablet 0   predniSONE  (DELTASONE ) 10 MG tablet Take one tablet (10mg ) twice daily for six days days, then one tablet (10mg ) once daily  for six days, then STOP. 18 tablet 0   silver sulfADIAZINE (SILVADENE) 1 % cream Apply topically.     sucralfate  (CARAFATE ) 1 g tablet Take 1 g by mouth 4 (four) times daily.     tacrolimus  (PROTOPIC ) 0.1 % ointment Apply topically 2 (two) times daily as needed. 100 g 3   topiramate  (TOPAMAX ) 200 MG tablet Take 200 mg by mouth 2 (two) times daily.     triamcinolone  ointment (KENALOG ) 0.1 % Apply 1 Application topically 2 (two) times daily as needed. DO NOT use on the face. Apply a thin layer to affected skin. 454 g 0   zolpidem  (AMBIEN ) 10 MG tablet Take 10 mg by mouth at bedtime.     thioridazine (MELLARIL) 10 MG tablet Take 1 tab at bedtime (Patient not taking: Reported on 03/01/2024)     No current facility-administered medications for this visit.   BP 137/82 (BP Location: Left Arm, Patient Position: Sitting, Cuff Size: Normal)   Pulse 69   Ht 5' 5.5 (1.664 m)   Wt 160 lb (72.6 kg)   SpO2 95%   BMI 26.22 kg/m   PHYSICAL EXAM:  BP 137/82 (BP Location: Left Arm, Patient Position: Sitting, Cuff Size: Normal)   Pulse 69   Ht 5' 5.5 (1.664 m)   Wt 160 lb (72.6 kg)   SpO2 95%   BMI 26.22 kg/m    Salient findings:  CN II-XII intact  Bilateral EAC clear and TM intact with well pneumatized middle ear spaces Nose: Anterior rhinoscopy reveals septum deviates left, right > left inferior turbinate hypertrophy.  Noted excoriation (left nasal ala), healing; no prominent vessels or epistaxis No lesions of oral cavity/oropharynx No obviously palpable neck masses/lymphadenopathy/thyromegaly No respiratory distress or stridor; easily tolerates secretions  PROCEDURE: None today  ASSESSMENT:  58 y.o.  with:  1. Other chronic sinusitis   2. Other dysphagia   3. Post-nasal drip   4. Subjective  hearing loss   5. Nasal congestion     Multiple complaints, including ears, nose and throat complaints. Endo and TFL reassuring; prior has had EGD which did not show a stricture but given her complaints. Given persistent sx during CT, do not suspect primarily sinonasal cause. MBS reassuring, with some dysmobility. HT upcoming.  Given overall reassuring exams and healing excoriation, suspect due to digital trauma and dryness.  PLAN: We've discussed issues and options today.  She has elected to proceed with:  Continue Flonase  BID and Astelin  BID Continue ayr gel multiple times per day If sx persist for facial pressure/pain, can consider Neurology referral for atypical facial pain; would not recommend FESS currently Consider GI re-referral if sx persist from dysphagia standpoint as MBS overall reassuring structurally Will call with audio results Follow up with me as needed  See below regarding exact medications prescribed this encounter including dosages and route: No orders of the defined types were placed in this encounter.    Thank you for allowing me the opportunity to care for your patient. Please do not hesitate to contact me should you have any other questions.  Sincerely, Eldora Blanch, MD Otolaryngologist (ENT), [facility] Phone: [phone] Fax: [phone]  MDM:  Level 4: 99214 Complexity/Problems addressed: mod - chronic problems, stable Data complexity: mod - independent interpretation of CT - Morbidity: low - Prescription Drug prescribed or managed: no       03/01/2024, 1:29 PM

## 2024-03-13 ENCOUNTER — Ambulatory Visit (INDEPENDENT_AMBULATORY_CARE_PROVIDER_SITE_OTHER): Admitting: Audiology

## 2024-03-13 ENCOUNTER — Ambulatory Visit (INDEPENDENT_AMBULATORY_CARE_PROVIDER_SITE_OTHER): Admitting: Otolaryngology

## 2024-03-13 DIAGNOSIS — H903 Sensorineural hearing loss, bilateral: Secondary | ICD-10-CM | POA: Diagnosis not present

## 2024-03-13 NOTE — Progress Notes (Signed)
  [address], [address] [place], [place] [address] [phone]  Audiological Evaluation    Name: [name]     DOB:   [birth year]      MRN:   [id number]                                                                                     Service Date: 03/13/2024     Accompanied by: unaccompanied   Patient comes today after Dr. Tobie, ENT sent a referral for a hearing evaluation due to concerns with hearing loss.   Symptoms Yes Details  Hearing loss  [x]  Both ears - maybe right ear is better  Tinnitus  [x]  Left worse  Ear pain/ infections/pressure  []    Balance problems  []    Noise exposure history  []    Previous ear surgeries  []    Family history of hearing loss  []    Amplification  []    Other  []      Otoscopy: Right ear: Clear external ear canal and notable landmarks visualized on the tympanic membrane. Left ear:  Clear external ear canal and notable landmarks visualized on the tympanic membrane.  Tympanometry: Right ear: Type A- Normal external ear canal volume with normal middle ear pressure and tympanic membrane compliance. Left ear: Type A- Normal external ear canal volume with normal middle ear pressure and tympanic membrane compliance.  Pure tone Audiometry: Both ears- Normal to moderate  sensorineural hearing loss from 125 Hz - 8000 Hz.    Speech Audiometry: Right ear- Speech Reception Threshold (SRT) was obtained at 30 dBHL. Left ear-Speech Reception Threshold (SRT) was obtained at 30 dBHL.   Word Recognition Score Tested using NU-6 (recorded) Right ear: 100% was obtained at a presentation level of 70 dBHL with contralateral masking which is deemed as  excellent. Left ear: 100% was obtained at a presentation level of 70 dBHL with contralateral masking which is deemed as  excellent.   The hearing test results were completed under headphones and results are deemed to be of good reliability. Test technique:  conventional      Recommendations: Follow up  with ENT as scheduled for today. Return for a hearing evaluation if concerns with hearing changes arise or per MD recommendation. Consider a communication needs assessment after medical clearance for hearing aids is obtained.   [name], AUD

## 2024-03-14 ENCOUNTER — Ambulatory Visit (HOSPITAL_COMMUNITY)

## 2024-03-19 ENCOUNTER — Encounter: Payer: Self-pay | Admitting: Audiology

## 2024-03-19 ENCOUNTER — Ambulatory Visit (HOSPITAL_COMMUNITY)

## 2024-03-20 DIAGNOSIS — R42 Dizziness and giddiness: Secondary | ICD-10-CM | POA: Diagnosis not present

## 2024-03-20 DIAGNOSIS — R234 Changes in skin texture: Secondary | ICD-10-CM | POA: Diagnosis not present

## 2024-03-20 DIAGNOSIS — J029 Acute pharyngitis, unspecified: Secondary | ICD-10-CM | POA: Diagnosis not present

## 2024-03-20 DIAGNOSIS — J189 Pneumonia, unspecified organism: Secondary | ICD-10-CM | POA: Diagnosis not present

## 2024-03-20 DIAGNOSIS — R059 Cough, unspecified: Secondary | ICD-10-CM | POA: Diagnosis not present

## 2024-03-20 DIAGNOSIS — J069 Acute upper respiratory infection, unspecified: Secondary | ICD-10-CM | POA: Diagnosis not present

## 2024-03-21 DIAGNOSIS — M79671 Pain in right foot: Secondary | ICD-10-CM | POA: Diagnosis not present

## 2024-03-21 DIAGNOSIS — L609 Nail disorder, unspecified: Secondary | ICD-10-CM | POA: Diagnosis not present

## 2024-03-21 DIAGNOSIS — M79674 Pain in right toe(s): Secondary | ICD-10-CM | POA: Diagnosis not present

## 2024-03-21 DIAGNOSIS — M79675 Pain in left toe(s): Secondary | ICD-10-CM | POA: Diagnosis not present

## 2024-03-21 DIAGNOSIS — M79672 Pain in left foot: Secondary | ICD-10-CM | POA: Diagnosis not present

## 2024-03-21 DIAGNOSIS — I739 Peripheral vascular disease, unspecified: Secondary | ICD-10-CM | POA: Diagnosis not present

## 2024-03-21 DIAGNOSIS — L110 Acquired keratosis follicularis: Secondary | ICD-10-CM | POA: Diagnosis not present

## 2024-03-26 ENCOUNTER — Ambulatory Visit (HOSPITAL_COMMUNITY)
Admission: RE | Admit: 2024-03-26 | Discharge: 2024-03-26 | Disposition: A | Discharge status: Home / Self Care | Source: Ambulatory Visit | Attending: Internal Medicine | Admitting: Internal Medicine

## 2024-03-26 ENCOUNTER — Encounter (HOSPITAL_COMMUNITY): Payer: Self-pay

## 2024-03-26 DIAGNOSIS — G894 Chronic pain syndrome: Secondary | ICD-10-CM | POA: Diagnosis not present

## 2024-03-26 DIAGNOSIS — Z1231 Encounter for screening mammogram for malignant neoplasm of breast: Secondary | ICD-10-CM | POA: Insufficient documentation

## 2024-03-26 DIAGNOSIS — E271 Primary adrenocortical insufficiency: Secondary | ICD-10-CM | POA: Diagnosis not present

## 2024-03-26 DIAGNOSIS — W57XXXA Bitten or stung by nonvenomous insect and other nonvenomous arthropods, initial encounter: Secondary | ICD-10-CM | POA: Diagnosis not present

## 2024-03-26 DIAGNOSIS — M4316 Spondylolisthesis, lumbar region: Secondary | ICD-10-CM | POA: Diagnosis not present

## 2024-03-26 DIAGNOSIS — M48061 Spinal stenosis, lumbar region without neurogenic claudication: Secondary | ICD-10-CM | POA: Diagnosis not present

## 2024-03-26 DIAGNOSIS — M4802 Spinal stenosis, cervical region: Secondary | ICD-10-CM | POA: Diagnosis not present

## 2024-03-26 DIAGNOSIS — Z6826 Body mass index (BMI) 26.0-26.9, adult: Secondary | ICD-10-CM | POA: Diagnosis not present

## 2024-04-03 ENCOUNTER — Encounter (HOSPITAL_COMMUNITY): Payer: Self-pay | Admitting: Emergency Medicine

## 2024-04-03 ENCOUNTER — Emergency Department (HOSPITAL_COMMUNITY)

## 2024-04-03 ENCOUNTER — Emergency Department (HOSPITAL_COMMUNITY)
Admission: EM | Admit: 2024-04-03 | Discharge: 2024-04-03 | Disposition: A | Discharge status: Home / Self Care | Attending: Emergency Medicine | Admitting: Emergency Medicine

## 2024-04-03 ENCOUNTER — Other Ambulatory Visit: Payer: Self-pay

## 2024-04-03 DIAGNOSIS — R7401 Elevation of levels of liver transaminase levels: Secondary | ICD-10-CM | POA: Insufficient documentation

## 2024-04-03 DIAGNOSIS — I10 Essential (primary) hypertension: Secondary | ICD-10-CM | POA: Diagnosis not present

## 2024-04-03 DIAGNOSIS — E876 Hypokalemia: Secondary | ICD-10-CM | POA: Insufficient documentation

## 2024-04-03 DIAGNOSIS — R748 Abnormal levels of other serum enzymes: Secondary | ICD-10-CM

## 2024-04-03 DIAGNOSIS — R42 Dizziness and giddiness: Secondary | ICD-10-CM | POA: Diagnosis not present

## 2024-04-03 DIAGNOSIS — R7989 Other specified abnormal findings of blood chemistry: Secondary | ICD-10-CM | POA: Diagnosis not present

## 2024-04-03 DIAGNOSIS — R0789 Other chest pain: Secondary | ICD-10-CM | POA: Insufficient documentation

## 2024-04-03 DIAGNOSIS — R079 Chest pain, unspecified: Secondary | ICD-10-CM | POA: Diagnosis not present

## 2024-04-03 DIAGNOSIS — I672 Cerebral atherosclerosis: Secondary | ICD-10-CM | POA: Diagnosis not present

## 2024-04-03 DIAGNOSIS — R55 Syncope and collapse: Secondary | ICD-10-CM | POA: Diagnosis not present

## 2024-04-03 LAB — CBC WITH DIFFERENTIAL/PLATELET
Abs Immature Granulocytes: 0.01 K/uL (ref 0.00–0.07)
Basophils Absolute: 0.1 K/uL (ref 0.0–0.1)
Basophils Relative: 1 %
Eosinophils Absolute: 0.2 K/uL (ref 0.0–0.5)
Eosinophils Relative: 3 %
HCT: 37.9 % (ref 36.0–46.0)
Hemoglobin: 12.3 g/dL (ref 12.0–15.0)
Immature Granulocytes: 0 %
Lymphocytes Relative: 40 %
Lymphs Abs: 2.9 K/uL (ref 0.7–4.0)
MCH: 29 pg (ref 26.0–34.0)
MCHC: 32.5 g/dL (ref 30.0–36.0)
MCV: 89.4 fL (ref 80.0–100.0)
Monocytes Absolute: 0.7 K/uL (ref 0.1–1.0)
Monocytes Relative: 10 %
Neutro Abs: 3.4 K/uL (ref 1.7–7.7)
Neutrophils Relative %: 46 %
Platelets: 283 K/uL (ref 150–400)
RBC: 4.24 MIL/uL (ref 3.87–5.11)
RDW: 14.6 % (ref 11.5–15.5)
WBC: 7.3 K/uL (ref 4.0–10.5)
nRBC: 0 % (ref 0.0–0.2)

## 2024-04-03 LAB — TROPONIN I (HIGH SENSITIVITY)
Troponin I (High Sensitivity): 4 ng/L (ref <18)
Troponin I (High Sensitivity): 5 ng/L (ref <18)

## 2024-04-03 LAB — BRAIN NATRIURETIC PEPTIDE: B Natriuretic Peptide: 123 pg/mL — ABNORMAL HIGH (ref 0.0–100.0)

## 2024-04-03 LAB — COMPREHENSIVE METABOLIC PANEL WITH GFR
ALT: 67 U/L — ABNORMAL HIGH (ref 0–44)
AST: 83 U/L — ABNORMAL HIGH (ref 15–41)
Albumin: 3.5 g/dL (ref 3.5–5.0)
Alkaline Phosphatase: 94 U/L (ref 38–126)
Anion gap: 10 (ref 5–15)
BUN: 23 mg/dL — ABNORMAL HIGH (ref 6–20)
CO2: 20 mmol/L — ABNORMAL LOW (ref 22–32)
Calcium: 8.6 mg/dL — ABNORMAL LOW (ref 8.9–10.3)
Chloride: 109 mmol/L (ref 98–111)
Creatinine, Ser: 1.25 mg/dL — ABNORMAL HIGH (ref 0.44–1.00)
GFR, Estimated: 49 mL/min — ABNORMAL LOW (ref >60)
Glucose, Bld: 117 mg/dL — ABNORMAL HIGH (ref 70–99)
Potassium: 3 mmol/L — ABNORMAL LOW (ref 3.5–5.1)
Sodium: 139 mmol/L (ref 135–145)
Total Bilirubin: 0.6 mg/dL (ref 0.0–1.2)
Total Protein: 6.7 g/dL (ref 6.5–8.1)

## 2024-04-03 LAB — MAGNESIUM: Magnesium: 1.9 mg/dL (ref 1.7–2.4)

## 2024-04-03 LAB — TSH: TSH: 1.79 u[IU]/mL (ref 0.350–4.500)

## 2024-04-03 MED ORDER — MECLIZINE HCL 12.5 MG PO TABS
25.0000 mg | ORAL_TABLET | Freq: Once | ORAL | Status: AC
  Administered 2024-04-03: 25 mg via ORAL
  Filled 2024-04-03: qty 2

## 2024-04-03 MED ORDER — LIDOCAINE VISCOUS HCL 2 % MT SOLN: 15.0000 mL | Freq: Once | OROMUCOSAL | Status: DC

## 2024-04-03 MED ORDER — MORPHINE SULFATE (PF) 4 MG/ML IV SOLN
2.0000 mg | Freq: Once | INTRAVENOUS | Status: AC
  Administered 2024-04-03: 2 mg via INTRAVENOUS
  Filled 2024-04-03: qty 1

## 2024-04-03 MED ORDER — POTASSIUM CHLORIDE CRYS ER 20 MEQ PO TBCR
40.0000 meq | EXTENDED_RELEASE_TABLET | Freq: Once | ORAL | Status: AC
  Administered 2024-04-03: 40 meq via ORAL
  Filled 2024-04-03: qty 2

## 2024-04-03 MED ORDER — LIDOCAINE VISCOUS HCL 2 % MT SOLN
15.0000 mL | Freq: Once | OROMUCOSAL | Status: AC
  Administered 2024-04-03: 15 mL via ORAL
  Filled 2024-04-03: qty 15

## 2024-04-03 MED ORDER — ALUM & MAG HYDROXIDE-SIMETH 200-200-20 MG/5ML PO SUSP
30.0000 mL | Freq: Once | ORAL | Status: AC
  Administered 2024-04-03: 30 mL via ORAL
  Filled 2024-04-03: qty 30

## 2024-04-03 MED ORDER — MORPHINE SULFATE (PF) 4 MG/ML IV SOLN
4.0000 mg | Freq: Once | INTRAVENOUS | Status: AC
  Administered 2024-04-03: 4 mg via INTRAVENOUS
  Filled 2024-04-03: qty 1

## 2024-04-03 NOTE — ED Triage Notes (Signed)
 Pt bib pov w/ c/o chest pain and hypertension. Pt reports she has had dizziness with position changes but does not feel like vertigo to her. Pt reports she has had 2 recent insect bites from and unknown insect. Pt finished an abx for the first bite, but received another bite 2 days ago. Pt reports she has a history of panic attacks but symptoms are different.

## 2024-04-03 NOTE — ED Provider Notes (Signed)
 [place] EMERGENCY DEPARTMENT AT [facility] Provider Note   CSN: [id number] Arrival date & time: 04/03/24  9048     Patient presents with: Chest Pain and Hypertension   [name] is a 58 y.o. female.   Patient is a 58 year old female who presents emergency department the chief complaint of chest pain, intermittent dizziness for approximate the past 3 days.  Patient also notes that she has had bug bites to her left shoulder.  She notes that she believes that she may have been exposed to mold at her home and notes that she has made her landlord aware of this.  She also notes that she has been experiencing increased bugs in her apartment.  Patient denies any active chest pain at this time.  She has had no associated shortness of breath.  She denies any abdominal pain.  She does admit to nausea and diarrhea without vomiting.  She denies any abnormal headaches, pain to neck or back.  She denies any associated syncope.   Chest Pain Hypertension Associated symptoms include chest pain.       Prior to Admission medications   Medication Sig Start Date End Date Taking? Authorizing Provider  albuterol  (PROVENTIL ) 2 MG tablet Take 2 mg by mouth 3 (three) times daily. 09/28/23   [provider]  albuterol  (VENTOLIN  HFA) 108 (90 Base) MCG/ACT inhaler Inhale 2 puffs into the lungs.    [provider]  alendronate (FOSAMAX) 70 MG tablet Take 70 mg by mouth once a week. Take with a full glass of water on an empty stomach On Mondays    [provider]  ALPRAZolam  (XANAX ) 0.5 MG tablet Take 0.5 mg by mouth 3 (three) times daily as needed for anxiety.    [provider]  atorvastatin  (LIPITOR) 40 MG tablet TAKE 1 TABLET (40 MG TOTAL) BY MOUTH DAILY. PLEASE KEEP UPCOMING APPOINTMENT FOR FURTHER REFILLS. 10/24/23   Lonni Slain, MD  Azelastine -Fluticasone  (DYMISTA ) 137-50 MCG/ACT SUSP Place 2 sprays into both nostrils 1 day or 1 dose. 02/17/24    Iva Marty Saltness, MD  buPROPion  (WELLBUTRIN  SR) 150 MG 12 hr tablet Take 150 mg by mouth daily. 04/14/22   [provider]  Calcium  Carbonate (CALCIUM  500 PO) Take 500 mg by mouth 3 (three) times daily.    [provider]  carvedilol  (COREG ) 6.25 MG tablet TAKE 1 TABLET TWICE DAILY WITH A MEAL. MUST KEEP FOLLOW UP VISIT FOR FURTHER REFILLS. 11/28/23   Walker, Caitlin S, NP  Cholecalciferol  (VITAMIN D -1000 MAX ST) 25 MCG (1000 UT) tablet Take 1,000 Units by mouth daily.    [provider]  clobetasol  ointment (TEMOVATE ) 0.05 % Apply 1 Application topically 2 (two) times daily. DO NOT use on the face. Apply for two weeks maximum at a time. 02/17/24   Iva Marty Saltness, MD  cyclobenzaprine  (FLEXERIL ) 10 MG tablet Take 10 mg by mouth 3 (three) times daily as needed for muscle spasms. 03/31/21   [provider]  dicyclomine  (BENTYL ) 20 MG tablet Take 20 mg by mouth 3 (three) times daily as needed. 08/09/23   [provider]  DULoxetine (CYMBALTA) 60 MG capsule Take 1 capsule by mouth daily.    [provider]  EPINEPHrine  0.3 mg/0.3 mL IJ SOAJ injection Inject 0.3 mg into the muscle as needed for anaphylaxis.    [provider]  ferrous sulfate 325 (65 FE) MG EC tablet Take 325 mg by mouth.    [provider]  gabapentin  (NEURONTIN ) 600 MG tablet Take 600 mg by mouth in the morning, at noon, in the evening, and at bedtime. 07/25/19   [provider]  hydrocortisone  (CORTEF ) 10 MG tablet Take 10-20 mg by mouth See admin instructions. Takes 20 mg by mouth in the morning and 10 mg at bedtime 04/07/16   [provider]  meclizine  (ANTIVERT ) 25 MG tablet 1 or 2 tabs PO q8h prn dizziness 04/16/16   Joyice Sauer, DO  montelukast (SINGULAIR) 10 MG tablet Take 10 mg by mouth at bedtime.    [provider]  nitroGLYCERIN  (NITROSTAT ) 0.4 MG SL tablet Place 1 tablet (0.4 mg total) under the tongue every 5 (five)  minutes as needed for chest pain. 01/29/22   Walker, Caitlin S, NP  omeprazole (PRILOSEC) 40 MG capsule Take 40 mg by mouth every morning.    [provider]  oxyCODONE  20 MG TABS Take 1 tablet (20 mg total) by mouth every 6 (six) hours as needed for severe pain ((score 7 to 10)). 03/20/21   Meyran, Suzen Lacks, NP  predniSONE  (DELTASONE ) 10 MG tablet Take one tablet (10mg ) twice daily for six days days, then one tablet (10mg ) once daily for six days, then STOP. 02/17/24   Iva Marty Saltness, MD  silver sulfADIAZINE (SILVADENE) 1 % cream Apply topically.    [provider]  sucralfate  (CARAFATE ) 1 g tablet Take 1 g by mouth 4 (four) times daily.    [provider]  tacrolimus  (PROTOPIC ) 0.1 % ointment Apply topically 2 (two) times daily as needed. 02/17/24   Iva Marty Saltness, MD  thioridazine (MELLARIL) 10 MG tablet Take 1 tab at bedtime Patient not taking: Reported on 03/01/2024 01/20/24   [provider]  topiramate  (TOPAMAX ) 200 MG tablet Take 200 mg by mouth 2 (two) times daily. 01/30/24   [provider]  triamcinolone  ointment (KENALOG ) 0.1 % Apply 1 Application topically 2 (two) times daily as needed. DO NOT use on the face. Apply a thin layer to affected skin. 02/17/24   Iva Marty Saltness, MD  zolpidem  (AMBIEN ) 10 MG tablet Take 10 mg by mouth at bedtime. 02/07/21   [provider]    Allergies: Bee venom, Erythromycin, Ibuprofen , Penicillins, Sulfa antibiotics, Cat dander, Dust mite extract, Tramadol, and Bactrim [sulfamethoxazole-trimethoprim]    Review of Systems  Cardiovascular:  Positive for chest pain.  All other systems reviewed and are negative.   Updated Vital Signs Ht 5' 5 (1.651 m)   Wt 72.6 kg   BMI 26.63 kg/m   Physical Exam Vitals and nursing note reviewed.  Constitutional:      General: She is not in acute distress.    Appearance: Normal appearance. She is not ill-appearing.  HENT:     Head:  Normocephalic and atraumatic.     Nose: Nose normal.     Mouth/Throat:     Mouth: Mucous membranes are moist.  Eyes:     Extraocular Movements: Extraocular movements intact.     Conjunctiva/sclera: Conjunctivae normal.     Pupils: Pupils are equal, round, and reactive to light.  Cardiovascular:     Rate and Rhythm: Normal rate and regular rhythm.     Pulses: Normal pulses.     Heart sounds: Normal heart sounds. Heart sounds not distant. No murmur heard. Pulmonary:     Effort: Pulmonary effort is normal. No tachypnea or respiratory distress.     Breath sounds: Normal breath sounds. No decreased breath sounds, wheezing, rhonchi or rales.  Chest:     Chest wall: No tenderness.  Abdominal:     General: Abdomen is flat. Bowel sounds are normal.     Palpations: Abdomen is soft.     Tenderness: There is no abdominal tenderness. There is no guarding.  Musculoskeletal:        General: Normal range of motion.     Cervical back: Normal range of motion and neck supple.  Skin:    General: Skin is warm and dry.     Findings: No ecchymosis or rash.  Neurological:     General: No focal deficit present.     Mental Status: She is alert and oriented to person, place, and time. Mental status is at baseline.     Cranial Nerves: No cranial nerve deficit.     Motor: No weakness.     Comments: Normal finger-nose, normal rapid altering movements, normal heel-to-shin  Psychiatric:        Mood and Affect: Mood normal.        Behavior: Behavior normal.        Thought Content: Thought content normal.        Judgment: Judgment normal.     (all labs ordered are listed, but only abnormal results are displayed) Labs Reviewed  COMPREHENSIVE METABOLIC PANEL WITH GFR  CBC WITH DIFFERENTIAL/PLATELET  TSH  BRAIN NATRIURETIC PEPTIDE  TROPONIN I (HIGH SENSITIVITY)    EKG: None  Radiology: No results found.   Procedures   Medications Ordered in the ED  morphine  (PF) 4 MG/ML injection 4 mg (has no  administration in time range)  meclizine  (ANTIVERT ) tablet 25 mg (has no administration in time range)                                    Medical Decision Making Amount and/or Complexity of Data Reviewed Labs: ordered. Radiology: ordered.  Risk OTC drugs. Prescription drug management.   This patient presents to the ED for concern of pain, dizziness differential diagnosis includes ACS, pulmonary embolus, pericarditis, myocarditis, endocarditis, sepsis, pneumonia, pneumothorax, hemothorax, peritonitis, myocarditis, endocarditis, CVA, TIA    Additional history obtained:  Additional history obtained from medical records External records from outside source obtained and reviewed including medical records   Lab Tests:  I Ordered, and personally interpreted labs.  The pertinent results include: No leukocytosis, no anemia, creatinine at baseline, mild hypokalemia, normal magnesium , elevation of AST and ALT, negative serial troponins, mild elevation of BNP   Imaging Studies ordered:  I ordered imaging studies including chest x-ray, CT scan of head I independently visualized and interpreted imaging which showed no acute cardiopulmonary process, no acute intracranial process I agree with the radiologist interpretation   Medicines ordered and prescription drug management:  I ordered medication including morphine , meclizine , Zofran , potassium for chest pain, dizziness, hypokalemia Reevaluation of the patient after these medicines showed that the patient improved I have reviewed the patients home medicines and have made adjustments as needed   Problem List / ED Course:  Patient is doing well at this time and is stable for discharge home.  Do not suspect ACS at this time.  EKG demonstrates no acute ischemic changes and patient has negative serial troponins.  CT scan of the head was unremarkable.  She has no other focal neurological deficits and do not suspect CVA or TIA at this  point.  Symptoms are nonpositional in nature and do not suspect pericarditis  or myocarditis.  She has no indication for endocarditis, aortic aneurysm or dissection.  Chest x-ray demonstrates no indication for pneumonia, pneumothorax, hemothorax.  Symptoms have improved with treatment in the emergency department.  She has had no further hypotension.  Discussed the need for close follow-up with primary care doctor on an outpatient basis.  Strict turn precautions were discussed for any new or worsening symptoms.  Patient voiced understanding and had no additional questions.  Patient was fully evaluated by attending physician who is in agreement to plan at this time.   Social Determinants of Health:  None        Final diagnoses:  None    ED Discharge Orders     None          Daralene Lonni JONETTA DEVONNA 04/03/24 1327    Garrick Charleston, MD 04/05/24 [phone]

## 2024-04-03 NOTE — Discharge Instructions (Addendum)
 Please follow-up closely with your primary care doctor on an outpatient basis.  Return to emergency department immediately for any new or worsening symptoms.  Please ensure that your primary care doctor recheck your liver enzymes.

## 2024-04-03 NOTE — ED Notes (Signed)
 Pt c/o worsening CP/ provider made aware

## 2024-04-11 NOTE — Progress Notes (Signed)
 Cardiology Office Note:  .   Date:  04/12/2024  ID:  [name], DOB 1965-10-03, MRN [id number] PCP: Bertell Satterfield, MD  [facility] Providers Cardiologist:  Shelda Bruckner, MD {  History of Present Illness: .   [name] is a 58 y.o. female  with a hx of CVA, hypertension, hyperlipidemia, prior tobacco abuse, Addison's disease. I met her 04/10/2021, and she was followed by Dr. Mona prior to that.  Pertinent CV history: CT coronary 2023 with Ca score 337 (98th %ile), minimal atherosclerosis without stenosis.  Today: Went to ER last week, had chest pain and high blood pressure, was under a lot of stress. Had headache, fatigue. In ER workup, hsTn unremarkable, ECG nonischemic. CT head unremarkable. She is very concerned about labs, reviewed today.  Chest pain is central, heart is racing, feels like she can't breath, chest is squeezed like being scared. Worse when she is stressed, which has been frequent recently. Happening multiple times/day and lasts 15-20 minutes at a time. Only when she is upset/stressed. Better when she is able to relax.   Still having headaches. Brings in a clot that she either coughed/sneezed up a few days ago, hasn't recurred. Has had chills, been sweating, no clear fevers. No productive cough. Has had 3 mos of congestion, has been seen by ENT, allergy , and PCP without improvement.  ROS: Denies shortness of breath at rest or with normal exertion. No PND, orthopnea, LE edema or unexpected weight gain. No syncope or palpitations. ROS otherwise negative except as noted.   Studies Reviewed: SABRA    EKG:       Physical Exam:   VS:  BP 124/82 (BP Location: Right Arm, Patient Position: Sitting, Cuff Size: Normal)   Pulse 76   Ht 5' 7 (1.702 m)   Wt 167 lb 14.4 oz (76.2 kg)   BMI 26.30 kg/m    Wt Readings from Last 3 Encounters:  04/12/24 167 lb 14.4 oz (76.2 kg)  04/03/24 160 lb (72.6 kg)  03/01/24 160 lb (72.6 kg)    GEN: Well nourished, well  developed in no acute distress HEENT: Normal, moist mucous membranes NECK: No JVD CARDIAC: regular rhythm, normal S1 and S2, no rubs or gallops. No murmur. VASCULAR: Radial and DP pulses 2+ bilaterally. No carotid bruits RESPIRATORY:  Clear to auscultation without rales, wheezing or rhonchi  ABDOMEN: Soft, non-tender, non-distended MUSCULOSKELETAL:  Ambulates independently SKIN: Warm and dry, no edema NEUROLOGIC:  Alert and oriented x 3. No focal neuro deficits noted. PSYCHIATRIC:  Normal affect    ASSESSMENT AND PLAN: .    History of CVA Hyperlipidemia tobacco abuse Nonobstructive CAD by coronary CT Chest pain -recent ER workup reassuring, normal hsTn, nonischemic ECG, reviewed her test results together -on atorvastatin  40 mg daily -not on antiplatelet, was on aspirin  and then changed to clopidogrel  with her stroke. Clopidogrel  stopped for back surgery, went back to aspirin , then was told to stop aspirin  due to bruising and kidneys -CT coronary 03/2022 with Ca score 337, minimal nonobstructive CAD -I do not have high suspicion that this is cardiac. I did offer stress test for further evaluation, despite low suspicion. She declines for now but will contact me if she changes her mind -reviewed red flag warning signs that need immediate medical attention  Hypertension -continue amlodipine , carvedilol   CV risk counseling and prevention -recommend heart healthy/Mediterranean diet, with whole grains, fruits, vegetable, fish, lean meats, nuts, and olive oil. Limit salt. -recommend moderate walking, 3-5 times/week  for 30-50 minutes each session. Aim for at least 150 minutes.week. Goal should be pace of 3 miles/hours, or walking 1.5 miles in 30 minutes -recommend avoidance of tobacco products. Avoid excess alcohol.  Dispo: 1 year or sooner as needed  Signed, Shelda Bruckner, MD   Shelda Bruckner, MD, PhD, [facility]  [facility]  [place]  [facility] at  [facility] at [facility] [address], [address] [place], [place] [address] [phone]

## 2024-04-12 ENCOUNTER — Encounter (HOSPITAL_BASED_OUTPATIENT_CLINIC_OR_DEPARTMENT_OTHER): Payer: Self-pay | Admitting: Cardiology

## 2024-04-12 ENCOUNTER — Ambulatory Visit (HOSPITAL_BASED_OUTPATIENT_CLINIC_OR_DEPARTMENT_OTHER): Admitting: Cardiology

## 2024-04-12 VITALS — BP 124/82 | HR 76 | Ht 67.0 in | Wt 167.9 lb

## 2024-04-12 DIAGNOSIS — Z8673 Personal history of transient ischemic attack (TIA), and cerebral infarction without residual deficits: Secondary | ICD-10-CM | POA: Diagnosis not present

## 2024-04-12 DIAGNOSIS — I2510 Atherosclerotic heart disease of native coronary artery without angina pectoris: Secondary | ICD-10-CM

## 2024-04-12 DIAGNOSIS — I10 Essential (primary) hypertension: Secondary | ICD-10-CM

## 2024-04-12 DIAGNOSIS — E785 Hyperlipidemia, unspecified: Secondary | ICD-10-CM | POA: Diagnosis not present

## 2024-04-12 DIAGNOSIS — I700 Atherosclerosis of aorta: Secondary | ICD-10-CM | POA: Diagnosis not present

## 2024-04-12 DIAGNOSIS — Z712 Person consulting for explanation of examination or test findings: Secondary | ICD-10-CM | POA: Diagnosis not present

## 2024-04-12 MED ORDER — NITROGLYCERIN 0.4 MG SL SUBL: 0.4000 mg | SUBLINGUAL_TABLET | SUBLINGUAL | 1 refills | Status: DC | PRN

## 2024-04-12 MED ORDER — CARVEDILOL 6.25 MG PO TABS: 6.2500 mg | ORAL_TABLET | Freq: Two times a day (BID) | ORAL | 3 refills | Status: AC

## 2024-04-12 NOTE — Patient Instructions (Signed)
 Medication Instructions:  Refills sent to pharmacy.  *If you need a refill on your cardiac medications before your next appointment, please call your pharmacy*  Lab Work: None ordered today. If you have labs (blood work) drawn today and your tests are completely normal, you will receive your results only by: MyChart Message (if you have MyChart) OR A paper copy in the mail If you have any lab test that is abnormal or we need to change your treatment, we will call you to review the results.  Testing/Procedures: None ordered today.  Follow-Up: At [facility], you and your health needs are our priority.  As part of our continuing mission to provide you with exceptional heart care, our providers are all part of one team.  This team includes your primary Cardiologist (physician) and Advanced Practice Providers or APPs (Physician Assistants and Nurse Practitioners) who all work together to provide you with the care you need, when you need it.  Your next appointment:   1 year(s)  Provider:   Shelda Bruckner, MD

## 2024-04-24 ENCOUNTER — Other Ambulatory Visit: Payer: Self-pay | Admitting: Allergy & Immunology

## 2024-04-26 DIAGNOSIS — E876 Hypokalemia: Secondary | ICD-10-CM | POA: Diagnosis not present

## 2024-04-26 DIAGNOSIS — I10 Essential (primary) hypertension: Secondary | ICD-10-CM | POA: Diagnosis not present

## 2024-04-26 DIAGNOSIS — R748 Abnormal levels of other serum enzymes: Secondary | ICD-10-CM | POA: Diagnosis not present

## 2024-04-26 DIAGNOSIS — R0789 Other chest pain: Secondary | ICD-10-CM | POA: Diagnosis not present

## 2024-04-26 DIAGNOSIS — G894 Chronic pain syndrome: Secondary | ICD-10-CM | POA: Diagnosis not present

## 2024-04-30 ENCOUNTER — Ambulatory Visit (HOSPITAL_COMMUNITY)
Admission: RE | Admit: 2024-04-30 | Discharge: 2024-04-30 | Disposition: A | Discharge status: Home / Self Care | Source: Ambulatory Visit | Attending: Family Medicine | Admitting: Family Medicine

## 2024-04-30 ENCOUNTER — Other Ambulatory Visit (HOSPITAL_COMMUNITY): Payer: Self-pay | Admitting: Family Medicine

## 2024-04-30 DIAGNOSIS — M25551 Pain in right hip: Secondary | ICD-10-CM | POA: Insufficient documentation

## 2024-04-30 DIAGNOSIS — I10 Essential (primary) hypertension: Secondary | ICD-10-CM | POA: Diagnosis not present

## 2024-04-30 DIAGNOSIS — N289 Disorder of kidney and ureter, unspecified: Secondary | ICD-10-CM | POA: Diagnosis not present

## 2024-04-30 DIAGNOSIS — E271 Primary adrenocortical insufficiency: Secondary | ICD-10-CM | POA: Diagnosis not present

## 2024-04-30 DIAGNOSIS — E782 Mixed hyperlipidemia: Secondary | ICD-10-CM | POA: Diagnosis not present

## 2024-04-30 DIAGNOSIS — G8929 Other chronic pain: Secondary | ICD-10-CM | POA: Diagnosis not present

## 2024-04-30 DIAGNOSIS — F5104 Psychophysiologic insomnia: Secondary | ICD-10-CM | POA: Diagnosis not present

## 2024-04-30 DIAGNOSIS — M549 Dorsalgia, unspecified: Secondary | ICD-10-CM | POA: Diagnosis not present

## 2024-04-30 DIAGNOSIS — F419 Anxiety disorder, unspecified: Secondary | ICD-10-CM | POA: Diagnosis not present

## 2024-04-30 DIAGNOSIS — Z8673 Personal history of transient ischemic attack (TIA), and cerebral infarction without residual deficits: Secondary | ICD-10-CM | POA: Diagnosis not present

## 2024-04-30 DIAGNOSIS — K219 Gastro-esophageal reflux disease without esophagitis: Secondary | ICD-10-CM | POA: Diagnosis not present

## 2024-04-30 DIAGNOSIS — E039 Hypothyroidism, unspecified: Secondary | ICD-10-CM | POA: Diagnosis not present

## 2024-04-30 DIAGNOSIS — R748 Abnormal levels of other serum enzymes: Secondary | ICD-10-CM | POA: Diagnosis not present

## 2024-05-09 ENCOUNTER — Encounter: Payer: Self-pay | Admitting: Gastroenterology

## 2024-05-15 DIAGNOSIS — R109 Unspecified abdominal pain: Secondary | ICD-10-CM | POA: Diagnosis not present

## 2024-05-15 DIAGNOSIS — Z860100 Personal history of colon polyps, unspecified: Secondary | ICD-10-CM | POA: Diagnosis not present

## 2024-05-15 DIAGNOSIS — K639 Disease of intestine, unspecified: Secondary | ICD-10-CM | POA: Diagnosis not present

## 2024-05-15 DIAGNOSIS — R748 Abnormal levels of other serum enzymes: Secondary | ICD-10-CM | POA: Diagnosis not present

## 2024-05-30 ENCOUNTER — Ambulatory Visit: Admitting: Gastroenterology

## 2024-06-04 DIAGNOSIS — M5451 Vertebrogenic low back pain: Secondary | ICD-10-CM | POA: Diagnosis not present

## 2024-06-04 DIAGNOSIS — M5416 Radiculopathy, lumbar region: Secondary | ICD-10-CM | POA: Diagnosis not present

## 2024-06-04 DIAGNOSIS — M5412 Radiculopathy, cervical region: Secondary | ICD-10-CM | POA: Diagnosis not present

## 2024-06-04 DIAGNOSIS — G894 Chronic pain syndrome: Secondary | ICD-10-CM | POA: Diagnosis not present

## 2024-06-04 DIAGNOSIS — Z79891 Long term (current) use of opiate analgesic: Secondary | ICD-10-CM | POA: Diagnosis not present

## 2024-06-04 DIAGNOSIS — M542 Cervicalgia: Secondary | ICD-10-CM | POA: Diagnosis not present

## 2024-06-06 ENCOUNTER — Other Ambulatory Visit (HOSPITAL_BASED_OUTPATIENT_CLINIC_OR_DEPARTMENT_OTHER): Payer: Self-pay | Admitting: Cardiology

## 2024-06-06 DIAGNOSIS — I2510 Atherosclerotic heart disease of native coronary artery without angina pectoris: Secondary | ICD-10-CM

## 2024-06-14 ENCOUNTER — Emergency Department (HOSPITAL_COMMUNITY)

## 2024-06-14 ENCOUNTER — Encounter (HOSPITAL_COMMUNITY): Payer: Self-pay

## 2024-06-14 ENCOUNTER — Other Ambulatory Visit (HOSPITAL_COMMUNITY)
Admission: RE | Admit: 2024-06-14 | Discharge: 2024-06-14 | Disposition: A | Discharge status: Home / Self Care | Source: Ambulatory Visit | Attending: Nephrology | Admitting: Nephrology

## 2024-06-14 ENCOUNTER — Emergency Department (HOSPITAL_COMMUNITY)
Admission: EM | Admit: 2024-06-14 | Discharge: 2024-06-14 | Disposition: A | Discharge status: Home / Self Care | Source: Ambulatory Visit | Attending: Emergency Medicine | Admitting: Emergency Medicine

## 2024-06-14 ENCOUNTER — Other Ambulatory Visit: Payer: Self-pay

## 2024-06-14 DIAGNOSIS — K5900 Constipation, unspecified: Secondary | ICD-10-CM | POA: Insufficient documentation

## 2024-06-14 DIAGNOSIS — R10A0 Flank pain, unspecified side: Secondary | ICD-10-CM | POA: Diagnosis not present

## 2024-06-14 DIAGNOSIS — Z9049 Acquired absence of other specified parts of digestive tract: Secondary | ICD-10-CM | POA: Diagnosis not present

## 2024-06-14 DIAGNOSIS — I509 Heart failure, unspecified: Secondary | ICD-10-CM | POA: Insufficient documentation

## 2024-06-14 DIAGNOSIS — I2510 Atherosclerotic heart disease of native coronary artery without angina pectoris: Secondary | ICD-10-CM | POA: Diagnosis not present

## 2024-06-14 DIAGNOSIS — Z90710 Acquired absence of both cervix and uterus: Secondary | ICD-10-CM | POA: Diagnosis not present

## 2024-06-14 DIAGNOSIS — Z79899 Other long term (current) drug therapy: Secondary | ICD-10-CM | POA: Insufficient documentation

## 2024-06-14 DIAGNOSIS — D72829 Elevated white blood cell count, unspecified: Secondary | ICD-10-CM | POA: Insufficient documentation

## 2024-06-14 DIAGNOSIS — E039 Hypothyroidism, unspecified: Secondary | ICD-10-CM | POA: Diagnosis not present

## 2024-06-14 DIAGNOSIS — I130 Hypertensive heart and chronic kidney disease with heart failure and stage 1 through stage 4 chronic kidney disease, or unspecified chronic kidney disease: Secondary | ICD-10-CM | POA: Diagnosis not present

## 2024-06-14 DIAGNOSIS — I129 Hypertensive chronic kidney disease with stage 1 through stage 4 chronic kidney disease, or unspecified chronic kidney disease: Secondary | ICD-10-CM | POA: Diagnosis not present

## 2024-06-14 DIAGNOSIS — R809 Proteinuria, unspecified: Secondary | ICD-10-CM | POA: Diagnosis present

## 2024-06-14 DIAGNOSIS — N1831 Chronic kidney disease, stage 3a: Secondary | ICD-10-CM | POA: Diagnosis not present

## 2024-06-14 DIAGNOSIS — N3001 Acute cystitis with hematuria: Secondary | ICD-10-CM | POA: Insufficient documentation

## 2024-06-14 DIAGNOSIS — N189 Chronic kidney disease, unspecified: Secondary | ICD-10-CM | POA: Diagnosis not present

## 2024-06-14 DIAGNOSIS — R310 Gross hematuria: Secondary | ICD-10-CM | POA: Diagnosis not present

## 2024-06-14 DIAGNOSIS — R1024 Suprapubic pain: Secondary | ICD-10-CM | POA: Diagnosis not present

## 2024-06-14 DIAGNOSIS — R10A1 Flank pain, right side: Secondary | ICD-10-CM | POA: Diagnosis present

## 2024-06-14 LAB — BASIC METABOLIC PANEL WITH GFR
Anion gap: 13 (ref 5–15)
BUN: 14 mg/dL (ref 6–20)
CO2: 25 mmol/L (ref 22–32)
Calcium: 9.7 mg/dL (ref 8.9–10.3)
Chloride: 101 mmol/L (ref 98–111)
Creatinine, Ser: 1.34 mg/dL — ABNORMAL HIGH (ref 0.44–1.00)
GFR, Estimated: 45 mL/min — ABNORMAL LOW (ref >60)
Glucose, Bld: 83 mg/dL (ref 70–99)
Potassium: 3.6 mmol/L (ref 3.5–5.1)
Sodium: 139 mmol/L (ref 135–145)

## 2024-06-14 LAB — CBC WITH DIFFERENTIAL/PLATELET
Abs Immature Granulocytes: 0.06 K/uL (ref 0.00–0.07)
Basophils Absolute: 0.1 K/uL (ref 0.0–0.1)
Basophils Relative: 1 %
Eosinophils Absolute: 0.2 K/uL (ref 0.0–0.5)
Eosinophils Relative: 1 %
HCT: 42.4 % (ref 36.0–46.0)
Hemoglobin: 13.7 g/dL (ref 12.0–15.0)
Immature Granulocytes: 0 %
Lymphocytes Relative: 20 %
Lymphs Abs: 3 K/uL (ref 0.7–4.0)
MCH: 29 pg (ref 26.0–34.0)
MCHC: 32.3 g/dL (ref 30.0–36.0)
MCV: 89.8 fL (ref 80.0–100.0)
Monocytes Absolute: 1.1 K/uL — ABNORMAL HIGH (ref 0.1–1.0)
Monocytes Relative: 8 %
Neutro Abs: 10.4 K/uL — ABNORMAL HIGH (ref 1.7–7.7)
Neutrophils Relative %: 70 %
Platelets: 311 K/uL (ref 150–400)
RBC: 4.72 MIL/uL (ref 3.87–5.11)
RDW: 14.8 % (ref 11.5–15.5)
WBC: 14.9 K/uL — ABNORMAL HIGH (ref 4.0–10.5)
nRBC: 0 % (ref 0.0–0.2)

## 2024-06-14 LAB — URINALYSIS, ROUTINE W REFLEX MICROSCOPIC
Bilirubin Urine: NEGATIVE
Glucose, UA: NEGATIVE mg/dL
Ketones, ur: NEGATIVE mg/dL
Nitrite: NEGATIVE
Protein, ur: NEGATIVE mg/dL
Specific Gravity, Urine: 1.002 — ABNORMAL LOW (ref 1.005–1.030)
pH: 6 (ref 5.0–8.0)

## 2024-06-14 MED ORDER — SODIUM CHLORIDE 0.9 % IV BOLUS
1000.0000 mL | Freq: Once | INTRAVENOUS | Status: AC
  Administered 2024-06-14: 1000 mL via INTRAVENOUS

## 2024-06-14 MED ORDER — CIPROFLOXACIN HCL 250 MG PO TABS
500.0000 mg | ORAL_TABLET | Freq: Once | ORAL | Status: AC
  Administered 2024-06-14: 500 mg via ORAL
  Filled 2024-06-14: qty 2

## 2024-06-14 MED ORDER — MORPHINE SULFATE (PF) 4 MG/ML IV SOLN
4.0000 mg | Freq: Once | INTRAVENOUS | Status: AC
  Administered 2024-06-14: 4 mg via INTRAVENOUS
  Filled 2024-06-14: qty 1

## 2024-06-14 MED ORDER — POLYETHYLENE GLYCOL 3350 17 G PO PACK: 17.0000 g | PACK | Freq: Every day | ORAL | 0 refills | Status: AC | PRN

## 2024-06-14 MED ORDER — CIPROFLOXACIN HCL 500 MG PO TABS: 500.0000 mg | ORAL_TABLET | Freq: Two times a day (BID) | ORAL | 0 refills | Status: DC

## 2024-06-14 MED ORDER — PHENAZOPYRIDINE HCL 100 MG PO TABS
200.0000 mg | ORAL_TABLET | Freq: Once | ORAL | Status: AC
  Administered 2024-06-14: 200 mg via ORAL
  Filled 2024-06-14: qty 2

## 2024-06-14 MED ORDER — PHENAZOPYRIDINE HCL 200 MG PO TABS: 200.0000 mg | ORAL_TABLET | Freq: Three times a day (TID) | ORAL | 0 refills | Status: DC

## 2024-06-14 NOTE — ED Triage Notes (Signed)
 Pt arrived via POV per her PCP request for further evaluation of urinary retention X 3 days. Pt endorses feeling like her urethra is blocked.

## 2024-06-14 NOTE — ED Provider Notes (Signed)
 [facility] Provider Note   CSN: [id number] Arrival date & time: 06/14/24  1023     Patient presents with: Urinary Retention   [name] is a 58 y.o. female.   Pt is a 58 yo female with pmhx significant for ckd, cardiomyopathy, hld, htn, hypothyroidism, gastroparesis, adrenal insufficiency, chronic back pain, CAD, CHF, tobacco abuse, GERD and CVA.  Pt said she's been having right sided back pain radiating around her right flank and has been feeling like she can't urinate.  She only gets out a trickle.  It burns when she tries to urinate.  Her doctor did order a urine sample today.  Pt was told to come in for further eval.       Prior to Admission medications   Medication Sig Start Date End Date Taking? Authorizing Provider  ciprofloxacin  (CIPRO ) 500 MG tablet Take 1 tablet (500 mg total) by mouth 2 (two) times daily. 06/14/24  Yes Dean Clarity, MD  phenazopyridine  (PYRIDIUM ) 200 MG tablet Take 1 tablet (200 mg total) by mouth 3 (three) times daily. 06/14/24  Yes Dean Clarity, MD  polyethylene glycol (MIRALAX ) 17 g packet Take 17 g by mouth daily as needed for moderate constipation. 06/14/24  Yes Dean Clarity, MD  albuterol  (PROVENTIL ) 2 MG tablet Take 2 mg by mouth 3 (three) times daily. Patient taking differently: Take 2 mg by mouth 3 (three) times daily. 09/28/23   [provider]  albuterol  (VENTOLIN  HFA) 108 (90 Base) MCG/ACT inhaler Inhale 2 puffs into the lungs. Patient not taking: Reported on 04/12/2024    [provider]  alendronate (FOSAMAX) 70 MG tablet Take 70 mg by mouth once a week. Take with a full glass of water on an empty stomach On Mondays    [provider]  ALPRAZolam  (XANAX ) 0.5 MG tablet Take 0.5 mg by mouth 3 (three) times daily as needed for anxiety.    [provider]  amLODipine  (NORVASC ) 10 MG tablet Take 10 mg by mouth daily.    [provider]  atorvastatin   (LIPITOR) 40 MG tablet TAKE 1 TABLET (40 MG TOTAL) BY MOUTH DAILY. PLEASE KEEP UPCOMING APPOINTMENT FOR FURTHER REFILLS. 10/24/23   Lonni Slain, MD  Azelastine -Fluticasone  (DYMISTA ) 137-50 MCG/ACT SUSP Place 2 sprays into both nostrils 1 day or 1 dose. Patient taking differently: Place 2 sprays into both nostrils 1 day or 1 dose. 02/17/24   Iva Marty Saltness, MD  buPROPion  (WELLBUTRIN  SR) 150 MG 12 hr tablet Take 150 mg by mouth daily. 04/14/22   [provider]  Calcium  Carbonate (CALCIUM  500 PO) Take 500 mg by mouth 3 (three) times daily.    [provider]  carvedilol  (COREG ) 6.25 MG tablet Take 1 tablet (6.25 mg total) by mouth 2 (two) times daily with a meal. 04/12/24   Lonni Slain, MD  Cholecalciferol  (VITAMIN D -1000 MAX ST) 25 MCG (1000 UT) tablet Take 1,000 Units by mouth daily. Patient taking differently: Take 2,000 Units by mouth daily.    [provider]  clobetasol  ointment (TEMOVATE ) 0.05 % APPLY TO THE AFFECTED AREA(S) TOPICALLY TWICE DAILY. DO NOT USE ON FACE. APPLY FOR 2 WEEKS MAXIMUM AT A TIME 04/24/24   Iva Marty Saltness, MD  cyclobenzaprine  (FLEXERIL ) 10 MG tablet Take 10 mg by mouth 3 (three) times daily as needed for muscle spasms. 03/31/21   [provider]  dicyclomine  (BENTYL ) 20 MG tablet Take 20 mg by mouth 3 (three) times daily as  needed. Patient not taking: Reported on 04/12/2024 08/09/23   [provider]  DULoxetine (CYMBALTA) 60 MG capsule Take 1 capsule by mouth daily.    [provider]  EPINEPHrine  0.3 mg/0.3 mL IJ SOAJ injection Inject 0.3 mg into the muscle as needed for anaphylaxis.    [provider]  ferrous sulfate 325 (65 FE) MG EC tablet Take 325 mg by mouth.    [provider]  gabapentin  (NEURONTIN ) 600 MG tablet Take 600 mg by mouth in the morning, at noon, in the evening, and at bedtime. 07/25/19   [provider]  hydrocortisone  (CORTEF ) 10 MG tablet Take  10-20 mg by mouth See admin instructions. Takes 20 mg by mouth in the morning and 10 mg at bedtime 04/07/16   [provider]  levothyroxine  (SYNTHROID ) 75 MCG tablet Take 75 mcg by mouth daily before breakfast.    [provider]  meclizine  (ANTIVERT ) 25 MG tablet 1 or 2 tabs PO q8h prn dizziness Patient taking differently: Take 25 mg by mouth as needed for dizziness or nausea. 1 or 2 tabs PO q8h prn dizziness 04/16/16   Joyice Sauer, DO  montelukast (SINGULAIR) 10 MG tablet Take 10 mg by mouth at bedtime.    [provider]  nitroGLYCERIN  (NITROSTAT ) 0.4 MG SL tablet DISSOLVE 1 TABLET UNDER THE TONGUE EVERY 5 MINUTES AS NEEDED FOR CHEST PAIN. DO NOT EXCEED A TOTAL OF 3 DOSES IN 15 MINUTES. 06/06/24   Lonni Slain, MD  omeprazole (PRILOSEC) 40 MG capsule Take 40 mg by mouth every morning.    [provider]  oxyCODONE  20 MG TABS Take 1 tablet (20 mg total) by mouth every 6 (six) hours as needed for severe pain ((score 7 to 10)). 03/20/21   Meyran, Suzen Lacks, NP  predniSONE  (DELTASONE ) 10 MG tablet Take one tablet (10mg ) twice daily for six days days, then one tablet (10mg ) once daily for six days, then STOP. Patient not taking: Reported on 04/12/2024 02/17/24   Iva Marty Saltness, MD  silver sulfADIAZINE (SILVADENE) 1 % cream Apply topically. Patient not taking: Reported on 04/12/2024    [provider]  sucralfate  (CARAFATE ) 1 g tablet Take 1 g by mouth 4 (four) times daily. Patient not taking: Reported on 04/12/2024    [provider]  tacrolimus  (PROTOPIC ) 0.1 % ointment Apply topically 2 (two) times daily as needed. 02/17/24   Iva Marty Saltness, MD  thioridazine (MELLARIL) 10 MG tablet Take 1 tab at bedtime Patient not taking: Reported on 04/12/2024 01/20/24   [provider]  topiramate  (TOPAMAX ) 200 MG tablet Take 200 mg by mouth 2 (two) times daily. 01/30/24   [provider]  triamcinolone  ointment (KENALOG )  0.1 % Apply 1 Application topically 2 (two) times daily as needed. DO NOT use on the face. Apply a thin layer to affected skin. 02/17/24   Iva Marty Saltness, MD  zolpidem  (AMBIEN ) 10 MG tablet Take 10 mg by mouth at bedtime. 02/07/21   [provider]    Allergies: Bee venom, Erythromycin, Ibuprofen , Penicillins, Sulfa antibiotics, Cat dander, Dust mite extract, Tramadol, and Bactrim [sulfamethoxazole-trimethoprim]    Review of Systems  Genitourinary:  Positive for difficulty urinating, dysuria, flank pain and hematuria.  Musculoskeletal:  Positive for back pain.  All other systems reviewed and are negative.   Updated Vital Signs BP (!) 143/83 (BP Location: Left Arm)   Pulse 78   Temp 98.8 F (37.1 C) (Oral)   Resp 16  Ht 5' 7 (1.702 m)   Wt 76.2 kg   SpO2 97%   BMI 26.31 kg/m   Physical Exam Vitals and nursing note reviewed.  Constitutional:      Appearance: Normal appearance.  HENT:     Head: Normocephalic and atraumatic.     Right Ear: External ear normal.     Left Ear: External ear normal.     Nose: Nose normal.     Mouth/Throat:     Mouth: Mucous membranes are dry.  Eyes:     Extraocular Movements: Extraocular movements intact.     Conjunctiva/sclera: Conjunctivae normal.     Pupils: Pupils are equal, round, and reactive to light.  Cardiovascular:     Rate and Rhythm: Normal rate and regular rhythm.     Pulses: Normal pulses.     Heart sounds: Normal heart sounds.  Pulmonary:     Effort: Pulmonary effort is normal.     Breath sounds: Normal breath sounds.  Abdominal:     General: Abdomen is flat. Bowel sounds are normal.     Palpations: Abdomen is soft.     Tenderness: There is abdominal tenderness in the suprapubic area.  Musculoskeletal:        General: Normal range of motion.     Cervical back: Normal range of motion and neck supple.  Skin:    General: Skin is warm.     Capillary Refill: Capillary refill takes less than 2 seconds.   Neurological:     General: No focal deficit present.     Mental Status: She is alert and oriented to person, place, and time.  Psychiatric:        Mood and Affect: Mood normal.        Behavior: Behavior normal.     (all labs ordered are listed, but only abnormal results are displayed) Labs Reviewed  BASIC METABOLIC PANEL WITH GFR - Abnormal; Notable for the following components:      Result Value   Creatinine, Ser 1.34 (*)    GFR, Estimated 45 (*)    All other components within normal limits  CBC WITH DIFFERENTIAL/PLATELET - Abnormal; Notable for the following components:   WBC 14.9 (*)    Neutro Abs 10.4 (*)    Monocytes Absolute 1.1 (*)    All other components within normal limits    EKG: None  Radiology: CT Renal Stone Study Result Date: 06/14/2024 CLINICAL DATA:  Flank pain. EXAM: CT ABDOMEN AND PELVIS WITHOUT CONTRAST TECHNIQUE: Multidetector CT imaging of the abdomen and pelvis was performed following the standard protocol without IV contrast. RADIATION DOSE REDUCTION: This exam was performed according to the departmental dose-optimization program which includes automated exposure control, adjustment of the mA and/or kV according to patient size and/or use of iterative reconstruction technique. COMPARISON:  October 14, 2023. FINDINGS: Lower chest: No acute abnormality. Hepatobiliary: No focal liver abnormality is seen. Status post cholecystectomy. No biliary dilatation. Pancreas: Unremarkable. No pancreatic ductal dilatation or surrounding inflammatory changes. Spleen: Normal in size without focal abnormality. Adrenals/Urinary Tract: Adrenal glands are unremarkable. Kidneys are normal, without renal calculi, focal lesion, or hydronephrosis. Bladder is unremarkable. Stomach/Bowel: The stomach is unremarkable. Moderate amount of stool seen throughout the colon. The appendix is unremarkable. No definite evidence of bowel obstruction is noted. Vascular/Lymphatic: Aortic  atherosclerosis. No enlarged abdominal or pelvic lymph nodes. Reproductive: Status post hysterectomy. No adnexal masses. Other: No abdominal wall hernia or abnormality. No abdominopelvic ascites. Musculoskeletal: Status post surgical posterior fusion of  L3-4 and L4-5. No acute osseous abnormality is noted. IMPRESSION: 1. Moderate amount of stool seen throughout the colon. 2. No acute abnormality seen in the abdomen or pelvis. 3. Aortic atherosclerosis. Aortic Atherosclerosis (ICD10-I70.0). Electronically Signed   By: Lynwood Landy Raddle M.D.   On: 06/14/2024 13:41     Procedures   Medications Ordered in the ED  ciprofloxacin  (CIPRO ) tablet 500 mg (has no administration in time range)  sodium chloride  0.9 % bolus 1,000 mL (1,000 mLs Intravenous New Bag/Given 06/14/24 1202)  phenazopyridine  (PYRIDIUM ) tablet 200 mg (200 mg Oral Given 06/14/24 1140)  morphine  (PF) 4 MG/ML injection 4 mg (4 mg Intravenous Given 06/14/24 1201)                                    Medical Decision Making Amount and/or Complexity of Data Reviewed Labs: ordered. Radiology: ordered.  Risk Prescription drug management.   This patient presents to the ED for concern of dysuria, flank pain, this involves an extensive number of treatment options, and is a complaint that carries with it a high risk of complications and morbidity.  The differential diagnosis includes uti, pyelo, kidney stone, retention   Co morbidities that complicate the patient evaluation  ckd, cardiomyopathy, hld, htn, hypothyroidism, gastroparesis, adrenal insufficiency, chronic back pain, CAD, CHF, tobacco abuse, GERD and CVA   Additional history obtained:  Additional history obtained from epic chart review  Lab Tests:  I Ordered, and personally interpreted labs.  The pertinent results include:  ua with mod hgb, lg le, 21-50 rbcs; cbc with wbc elevated at 14.9; bmp with cr 1.34 (stable)   Imaging Studies ordered:  I ordered imaging studies  including ct renal  I independently visualized and interpreted imaging which showed  Moderate amount of stool seen throughout the colon.  2. No acute abnormality seen in the abdomen or pelvis.  3. Aortic atherosclerosis.    Aortic Atherosclerosis (ICD10-I70.0).   I agree with the radiologist interpretation   Medicines ordered and prescription drug management:  I ordered medication including morphine /pyridium   for sx  Reevaluation of the patient after these medicines showed that the patient improved I have reviewed the patients home medicines and have made adjustments as needed   Test Considered:  ct   Problem List / ED Course:  UTI with hematuria:  pt given cipro  (multiple other allergies); urine from earlier sent for cx.  No kidney stone or pyelo.  9 cc of urine in bladder, so no retention, just frequency. Constipation:  pt d/c with miralax  and told to increase fiber intake   Reevaluation:  After the interventions noted above, I reevaluated the patient and found that they have :improved   Social Determinants of Health:  Lives at home   Dispostion:  After consideration of the diagnostic results and the patients response to treatment, I feel that the patent would benefit from discharge with outpatient f/u.       Final diagnoses:  Acute cystitis with hematuria  Constipation, unspecified constipation type    ED Discharge Orders          Ordered    ciprofloxacin  (CIPRO ) 500 MG tablet  2 times daily        06/14/24 1350    phenazopyridine  (PYRIDIUM ) 200 MG tablet  3 times daily        06/14/24 1350    polyethylene glycol (MIRALAX ) 17 g packet  Daily PRN  06/14/24 1350               Dean Clarity, MD 06/14/24 1353

## 2024-06-16 LAB — URINE CULTURE: Culture: >=100000 — AB

## 2024-06-20 DIAGNOSIS — M5021 Other cervical disc displacement,  high cervical region: Secondary | ICD-10-CM | POA: Diagnosis not present

## 2024-06-20 DIAGNOSIS — M542 Cervicalgia: Secondary | ICD-10-CM | POA: Diagnosis not present

## 2024-06-20 DIAGNOSIS — M4802 Spinal stenosis, cervical region: Secondary | ICD-10-CM | POA: Diagnosis not present

## 2024-06-20 DIAGNOSIS — M47812 Spondylosis without myelopathy or radiculopathy, cervical region: Secondary | ICD-10-CM | POA: Diagnosis not present

## 2024-06-20 DIAGNOSIS — M4803 Spinal stenosis, cervicothoracic region: Secondary | ICD-10-CM | POA: Diagnosis not present

## 2024-06-23 ENCOUNTER — Other Ambulatory Visit: Payer: Self-pay | Admitting: Allergy & Immunology

## 2024-06-25 DIAGNOSIS — M5451 Vertebrogenic low back pain: Secondary | ICD-10-CM | POA: Diagnosis not present

## 2024-06-25 DIAGNOSIS — M542 Cervicalgia: Secondary | ICD-10-CM | POA: Diagnosis not present

## 2024-06-25 DIAGNOSIS — M5416 Radiculopathy, lumbar region: Secondary | ICD-10-CM | POA: Diagnosis not present

## 2024-06-25 DIAGNOSIS — G894 Chronic pain syndrome: Secondary | ICD-10-CM | POA: Diagnosis not present

## 2024-07-02 DIAGNOSIS — E039 Hypothyroidism, unspecified: Secondary | ICD-10-CM | POA: Diagnosis not present

## 2024-07-02 DIAGNOSIS — E782 Mixed hyperlipidemia: Secondary | ICD-10-CM | POA: Diagnosis not present

## 2024-07-02 DIAGNOSIS — R748 Abnormal levels of other serum enzymes: Secondary | ICD-10-CM | POA: Diagnosis not present

## 2024-07-02 DIAGNOSIS — N289 Disorder of kidney and ureter, unspecified: Secondary | ICD-10-CM | POA: Diagnosis not present

## 2024-07-02 DIAGNOSIS — E271 Primary adrenocortical insufficiency: Secondary | ICD-10-CM | POA: Diagnosis not present

## 2024-07-02 DIAGNOSIS — F419 Anxiety disorder, unspecified: Secondary | ICD-10-CM | POA: Diagnosis not present

## 2024-07-02 DIAGNOSIS — Z23 Encounter for immunization: Secondary | ICD-10-CM | POA: Diagnosis not present

## 2024-07-02 DIAGNOSIS — Z8673 Personal history of transient ischemic attack (TIA), and cerebral infarction without residual deficits: Secondary | ICD-10-CM | POA: Diagnosis not present

## 2024-07-02 DIAGNOSIS — F17210 Nicotine dependence, cigarettes, uncomplicated: Secondary | ICD-10-CM | POA: Diagnosis not present

## 2024-07-02 DIAGNOSIS — I10 Essential (primary) hypertension: Secondary | ICD-10-CM | POA: Diagnosis not present

## 2024-07-02 DIAGNOSIS — N390 Urinary tract infection, site not specified: Secondary | ICD-10-CM | POA: Diagnosis not present

## 2024-07-02 DIAGNOSIS — M810 Age-related osteoporosis without current pathological fracture: Secondary | ICD-10-CM | POA: Insufficient documentation

## 2024-07-02 DIAGNOSIS — F5104 Psychophysiologic insomnia: Secondary | ICD-10-CM | POA: Diagnosis not present

## 2024-07-02 DIAGNOSIS — M25551 Pain in right hip: Secondary | ICD-10-CM | POA: Diagnosis not present

## 2024-07-09 DIAGNOSIS — R202 Paresthesia of skin: Secondary | ICD-10-CM | POA: Diagnosis not present

## 2024-07-09 DIAGNOSIS — T148XXA Other injury of unspecified body region, initial encounter: Secondary | ICD-10-CM | POA: Diagnosis not present

## 2024-07-10 DIAGNOSIS — M542 Cervicalgia: Secondary | ICD-10-CM | POA: Diagnosis not present

## 2024-07-10 DIAGNOSIS — M5412 Radiculopathy, cervical region: Secondary | ICD-10-CM | POA: Diagnosis not present

## 2024-07-10 DIAGNOSIS — Z6827 Body mass index (BMI) 27.0-27.9, adult: Secondary | ICD-10-CM | POA: Diagnosis not present

## 2024-07-18 ENCOUNTER — Other Ambulatory Visit (HOSPITAL_COMMUNITY): Payer: Self-pay | Admitting: Nurse Practitioner

## 2024-07-18 DIAGNOSIS — R1900 Intra-abdominal and pelvic swelling, mass and lump, unspecified site: Secondary | ICD-10-CM | POA: Insufficient documentation

## 2024-07-18 DIAGNOSIS — M25551 Pain in right hip: Secondary | ICD-10-CM | POA: Diagnosis not present

## 2024-07-18 DIAGNOSIS — N898 Other specified noninflammatory disorders of vagina: Secondary | ICD-10-CM | POA: Diagnosis not present

## 2024-07-19 ENCOUNTER — Encounter (HOSPITAL_COMMUNITY): Payer: Self-pay | Admitting: *Deleted

## 2024-07-19 ENCOUNTER — Other Ambulatory Visit: Payer: Self-pay

## 2024-07-19 ENCOUNTER — Emergency Department (HOSPITAL_COMMUNITY)

## 2024-07-19 ENCOUNTER — Emergency Department (HOSPITAL_COMMUNITY)
Admission: EM | Admit: 2024-07-19 | Discharge: 2024-07-19 | Disposition: A | Discharge status: Home / Self Care | Source: Ambulatory Visit | Attending: Emergency Medicine | Admitting: Emergency Medicine

## 2024-07-19 DIAGNOSIS — R300 Dysuria: Secondary | ICD-10-CM | POA: Insufficient documentation

## 2024-07-19 DIAGNOSIS — R109 Unspecified abdominal pain: Secondary | ICD-10-CM | POA: Diagnosis not present

## 2024-07-19 DIAGNOSIS — R1084 Generalized abdominal pain: Secondary | ICD-10-CM | POA: Insufficient documentation

## 2024-07-19 DIAGNOSIS — R1010 Upper abdominal pain, unspecified: Secondary | ICD-10-CM

## 2024-07-19 DIAGNOSIS — D72829 Elevated white blood cell count, unspecified: Secondary | ICD-10-CM | POA: Insufficient documentation

## 2024-07-19 DIAGNOSIS — Z9049 Acquired absence of other specified parts of digestive tract: Secondary | ICD-10-CM | POA: Diagnosis not present

## 2024-07-19 DIAGNOSIS — R1011 Right upper quadrant pain: Secondary | ICD-10-CM | POA: Diagnosis not present

## 2024-07-19 DIAGNOSIS — Z90710 Acquired absence of both cervix and uterus: Secondary | ICD-10-CM | POA: Diagnosis not present

## 2024-07-19 LAB — URINALYSIS, ROUTINE W REFLEX MICROSCOPIC
Bilirubin Urine: NEGATIVE
Glucose, UA: NEGATIVE mg/dL
Hgb urine dipstick: NEGATIVE
Ketones, ur: NEGATIVE mg/dL
Nitrite: NEGATIVE
Protein, ur: NEGATIVE mg/dL
Specific Gravity, Urine: 1.006 (ref 1.005–1.030)
pH: 6 (ref 5.0–8.0)

## 2024-07-19 LAB — COMPREHENSIVE METABOLIC PANEL WITH GFR
ALT: 16 U/L (ref 0–44)
AST: 19 U/L (ref 15–41)
Albumin: 4.4 g/dL (ref 3.5–5.0)
Alkaline Phosphatase: 80 U/L (ref 38–126)
Anion gap: 11 (ref 5–15)
BUN: 11 mg/dL (ref 6–20)
CO2: 25 mmol/L (ref 22–32)
Calcium: 9.2 mg/dL (ref 8.9–10.3)
Chloride: 106 mmol/L (ref 98–111)
Creatinine, Ser: 1.2 mg/dL — ABNORMAL HIGH (ref 0.44–1.00)
GFR, Estimated: 52 mL/min — ABNORMAL LOW (ref >60)
Glucose, Bld: 97 mg/dL (ref 70–99)
Potassium: 3.6 mmol/L (ref 3.5–5.1)
Sodium: 142 mmol/L (ref 135–145)
Total Bilirubin: 0.2 mg/dL (ref 0.0–1.2)
Total Protein: 7.3 g/dL (ref 6.5–8.1)

## 2024-07-19 LAB — CBC
HCT: 40.3 % (ref 36.0–46.0)
Hemoglobin: 12.9 g/dL (ref 12.0–15.0)
MCH: 29.1 pg (ref 26.0–34.0)
MCHC: 32 g/dL (ref 30.0–36.0)
MCV: 91 fL (ref 80.0–100.0)
Platelets: 316 K/uL (ref 150–400)
RBC: 4.43 MIL/uL (ref 3.87–5.11)
RDW: 14.8 % (ref 11.5–15.5)
WBC: 11.3 K/uL — ABNORMAL HIGH (ref 4.0–10.5)
nRBC: 0 % (ref 0.0–0.2)

## 2024-07-19 LAB — LIPASE, BLOOD: Lipase: 21 U/L (ref 11–51)

## 2024-07-19 MED ORDER — IOHEXOL 300 MG/ML  SOLN
100.0000 mL | Freq: Once | INTRAMUSCULAR | Status: AC | PRN
  Administered 2024-07-19: 100 mL via INTRAVENOUS

## 2024-07-19 MED ORDER — HYDROMORPHONE HCL 1 MG/ML IJ SOLN
0.5000 mg | Freq: Once | INTRAMUSCULAR | Status: AC
  Administered 2024-07-19: 0.5 mg via INTRAVENOUS
  Filled 2024-07-19: qty 0.5

## 2024-07-19 MED ORDER — ONDANSETRON 4 MG PO TBDP: ORAL_TABLET | ORAL | 0 refills | Status: AC

## 2024-07-19 MED ORDER — CIPROFLOXACIN HCL 500 MG PO TABS: 500.0000 mg | ORAL_TABLET | Freq: Two times a day (BID) | ORAL | 0 refills | Status: DC

## 2024-07-19 NOTE — ED Notes (Signed)
 Pt requesting pain med, EDP made aware

## 2024-07-19 NOTE — ED Notes (Signed)
Pt ambulated to BR without difficultly

## 2024-07-19 NOTE — ED Triage Notes (Signed)
 Pt complains of RLQ pain x 2 years. Went to doctor yesterday and was told she had a hernia. Pt states. Last night pain was shooting into vagina and at 3am went to bathroom and urine smelled like poop

## 2024-07-19 NOTE — Discharge Instructions (Signed)
 Follow-up with dr. Mavis  or one of his colleagues in the next couple weeks

## 2024-07-19 NOTE — ED Notes (Signed)
 Attempted x 2 for IV without success. CN in room to assess for access

## 2024-07-19 NOTE — ED Notes (Signed)
 Pt returned from CT

## 2024-07-20 LAB — URINE CULTURE

## 2024-07-20 NOTE — ED Provider Notes (Signed)
 [place] EMERGENCY DEPARTMENT AT [facility] Provider Note   CSN: [id number] Arrival date & time: 07/19/24  9049     Patient presents with: Abdominal Pain   [name] is a 58 y.o. female.   Patient complains of abdominal discomfort and possible herniated in abdomen  The history is provided by the patient and medical records.  Abdominal Pain Pain location:  Generalized Pain quality: aching   Pain radiates to:  Does not radiate Pain severity:  Mild Onset quality:  Sudden Timing:  Constant Progression:  Worsening Chronicity:  New Context: not alcohol use   Associated symptoms: no chest pain, no cough, no diarrhea, no fatigue and no hematuria        Prior to Admission medications   Medication Sig Start Date End Date Taking? Authorizing Provider  ciprofloxacin  (CIPRO ) 500 MG tablet Take 1 tablet (500 mg total) by mouth 2 (two) times daily. One po bid x 7 days 07/19/24  Yes Schuyler Behan, MD  ondansetron  (ZOFRAN -ODT) 4 MG disintegrating tablet 4mg  ODT q4 hours prn nausea/vomit 07/19/24  Yes Brittne Kawasaki, MD  albuterol  (PROVENTIL ) 2 MG tablet Take 2 mg by mouth 3 (three) times daily. Patient taking differently: Take 2 mg by mouth 3 (three) times daily. 09/28/23   [provider]  albuterol  (VENTOLIN  HFA) 108 (90 Base) MCG/ACT inhaler Inhale 2 puffs into the lungs. Patient not taking: Reported on 04/12/2024    [provider]  alendronate (FOSAMAX) 70 MG tablet Take 70 mg by mouth once a week. Take with a full glass of water on an empty stomach On Mondays    [provider]  ALPRAZolam  (XANAX ) 0.5 MG tablet Take 0.5 mg by mouth 3 (three) times daily as needed for anxiety.    [provider]  amLODipine  (NORVASC ) 10 MG tablet Take 10 mg by mouth daily.    [provider]  atorvastatin  (LIPITOR) 40 MG tablet TAKE 1 TABLET (40 MG TOTAL) BY MOUTH DAILY. PLEASE KEEP UPCOMING APPOINTMENT FOR FURTHER REFILLS. 10/24/23    Lonni Slain, MD  Azelastine -Fluticasone  (DYMISTA ) 137-50 MCG/ACT SUSP Place 2 sprays into both nostrils 1 day or 1 dose. Patient taking differently: Place 2 sprays into both nostrils 1 day or 1 dose. 02/17/24   Iva Marty Saltness, MD  buPROPion  (WELLBUTRIN  SR) 150 MG 12 hr tablet Take 150 mg by mouth daily. 04/14/22   [provider]  Calcium  Carbonate (CALCIUM  500 PO) Take 500 mg by mouth 3 (three) times daily.    [provider]  carvedilol  (COREG ) 6.25 MG tablet Take 1 tablet (6.25 mg total) by mouth 2 (two) times daily with a meal. 04/12/24   Lonni Slain, MD  Cholecalciferol  (VITAMIN D -1000 MAX ST) 25 MCG (1000 UT) tablet Take 1,000 Units by mouth daily. Patient taking differently: Take 2,000 Units by mouth daily.    [provider]  clobetasol  ointment (TEMOVATE ) 0.05 % APPLY TO THE AFFECTED AREA(S) TOPICALLY TWICE DAILY. DO NOT USE ON FACE. APPLY FOR 2 WEEKS MAXIMUM AT A TIME 06/25/24   Iva Marty Saltness, MD  cyclobenzaprine  (FLEXERIL ) 10 MG tablet Take 10 mg by mouth 3 (three) times daily as needed for muscle spasms. 03/31/21   [provider]  dicyclomine  (BENTYL ) 20 MG tablet Take 20 mg by mouth 3 (three) times daily as needed. Patient not taking: Reported on 04/12/2024 08/09/23   [provider]  DULoxetine (CYMBALTA) 60 MG capsule Take 1 capsule by mouth daily.    [provider]  EPINEPHrine  0.3 mg/0.3 mL IJ SOAJ injection Inject 0.3 mg into the muscle as needed for anaphylaxis.    [provider]  ferrous sulfate 325 (65 FE) MG EC tablet Take 325 mg by mouth.    [provider]  gabapentin  (NEURONTIN ) 600 MG tablet Take 600 mg by mouth in the morning, at noon, in the evening, and at bedtime. 07/25/19   [provider]  hydrocortisone  (CORTEF ) 10 MG tablet Take 10-20 mg by mouth See admin instructions. Takes 20 mg by mouth in the morning and 10 mg at bedtime 04/07/16   [provider]  levothyroxine  (SYNTHROID ) 75 MCG tablet Take 75 mcg by mouth daily before breakfast.    [provider]  meclizine  (ANTIVERT ) 25 MG tablet 1 or 2 tabs PO q8h prn dizziness Patient taking differently: Take 25 mg by mouth as needed for dizziness or nausea. 1 or 2 tabs PO q8h prn dizziness 04/16/16   Joyice Sauer, DO  montelukast (SINGULAIR) 10 MG tablet Take 10 mg by mouth at bedtime.    [provider]  nitroGLYCERIN  (NITROSTAT ) 0.4 MG SL tablet DISSOLVE 1 TABLET UNDER THE TONGUE EVERY 5 MINUTES AS NEEDED FOR CHEST PAIN. DO NOT EXCEED A TOTAL OF 3 DOSES IN 15 MINUTES. 06/06/24   Lonni Slain, MD  omeprazole (PRILOSEC) 40 MG capsule Take 40 mg by mouth every morning.    [provider]  oxyCODONE  20 MG TABS Take 1 tablet (20 mg total) by mouth every 6 (six) hours as needed for severe pain ((score 7 to 10)). 03/20/21   Meyran, Suzen Lacks, NP  phenazopyridine  (PYRIDIUM ) 200 MG tablet Take 1 tablet (200 mg total) by mouth 3 (three) times daily. 06/14/24   Haviland, Julie, MD  polyethylene glycol (MIRALAX ) 17 g packet Take 17 g by mouth daily as needed for moderate constipation. 06/14/24   Dean Clarity, MD  predniSONE  (DELTASONE ) 10 MG tablet Take one tablet (10mg ) twice daily for six days days, then one tablet (10mg ) once daily for six days, then STOP. Patient not taking: Reported on 04/12/2024 02/17/24   Iva Marty Saltness, MD  silver sulfADIAZINE (SILVADENE) 1 % cream Apply topically. Patient not taking: Reported on 04/12/2024    [provider]  sucralfate  (CARAFATE ) 1 g tablet Take 1 g by mouth 4 (four) times daily. Patient not taking: Reported on 04/12/2024    [provider]  tacrolimus  (PROTOPIC ) 0.1 % ointment Apply topically 2 (two) times daily as needed. 02/17/24   Iva Marty Saltness, MD  thioridazine (MELLARIL) 10 MG tablet Take 1 tab at bedtime Patient not taking: Reported on 04/12/2024 01/20/24   [provider]   topiramate  (TOPAMAX ) 200 MG tablet Take 200 mg by mouth 2 (two) times daily. 01/30/24   [provider]  triamcinolone  ointment (KENALOG ) 0.1 % Apply 1 Application topically 2 (two) times daily as needed. DO NOT use on the face. Apply a thin layer to affected skin. 02/17/24   Iva Marty Saltness, MD  zolpidem  (AMBIEN ) 10 MG tablet Take 10 mg by mouth at bedtime. 02/07/21   [provider]    Allergies: Bee venom, Erythromycin, Ibuprofen , Penicillins, Sulfa antibiotics, Cat dander, Dust mite extract, Tramadol, and Bactrim [sulfamethoxazole-trimethoprim]    Review of Systems  Constitutional:  Negative for appetite change and fatigue.  HENT:  Negative for congestion, ear discharge and sinus pressure.   Eyes:  Negative for discharge.  Respiratory:  Negative for cough.   Cardiovascular:  Negative for  chest pain.  Gastrointestinal:  Positive for abdominal pain. Negative for diarrhea.  Genitourinary:  Negative for frequency and hematuria.  Musculoskeletal:  Negative for back pain.  Skin:  Negative for rash.  Neurological:  Negative for seizures and headaches.  Psychiatric/Behavioral:  Negative for hallucinations.     Updated Vital Signs BP (!) 146/94   Pulse 65   Temp 98.7 F (37.1 C)   Resp 17   Ht 5' 7 (1.702 m)   Wt 74.8 kg   SpO2 95%   BMI 25.84 kg/m   Physical Exam Vitals and nursing note reviewed.  Constitutional:      Appearance: She is well-developed.  HENT:     Head: Normocephalic.     Nose: Nose normal.  Eyes:     General: No scleral icterus.    Conjunctiva/sclera: Conjunctivae normal.  Neck:     Thyroid : No thyromegaly.  Cardiovascular:     Rate and Rhythm: Normal rate and regular rhythm.     Heart sounds: No murmur heard.    No friction rub. No gallop.  Pulmonary:     Breath sounds: No stridor. No wheezing or rales.  Chest:     Chest wall: No tenderness.  Abdominal:     General: There is no distension.     Tenderness: There is no  abdominal tenderness. There is no rebound.  Musculoskeletal:        General: Normal range of motion.     Cervical back: Neck supple.  Lymphadenopathy:     Cervical: No cervical adenopathy.  Skin:    Findings: No erythema or rash.  Neurological:     Mental Status: She is alert and oriented to person, place, and time.     Motor: No abnormal muscle tone.     Coordination: Coordination normal.  Psychiatric:        Behavior: Behavior normal.     (all labs ordered are listed, but only abnormal results are displayed) Labs Reviewed  URINE CULTURE - Abnormal; Notable for the following components:      Result Value   Culture MULTIPLE SPECIES PRESENT, SUGGEST RECOLLECTION (*)    All other components within normal limits  COMPREHENSIVE METABOLIC PANEL WITH GFR - Abnormal; Notable for the following components:   Creatinine, Ser 1.20 (*)    GFR, Estimated 52 (*)    All other components within normal limits  CBC - Abnormal; Notable for the following components:   WBC 11.3 (*)    All other components within normal limits  URINALYSIS, ROUTINE W REFLEX MICROSCOPIC - Abnormal; Notable for the following components:   Leukocytes,Ua SMALL (*)    Bacteria, UA RARE (*)    All other components within normal limits  LIPASE, BLOOD    EKG: None  Radiology: CT ABDOMEN PELVIS W CONTRAST Result Date: 07/19/2024 EXAM: CT ABDOMEN AND PELVIS WITH CONTRAST 07/19/2024 12:47:12 PM TECHNIQUE: CT of the abdomen and pelvis was performed with the administration of intravenous contrast. 100 mL of iohexol  (OMNIPAQUE ) 300 MG/ML solution was administered. Multiplanar reformatted images are provided for review. Automated exposure control, iterative reconstruction, and/or weight-based adjustment of the mA/kV was utilized to reduce the radiation dose to as low as reasonably achievable. COMPARISON: 06/14/2024 CLINICAL HISTORY: Abdominal pain, acute, nonlocalized. FINDINGS: LOWER CHEST: No acute abnormality. LIVER: Mild  intrahepatic and extrahepatic biliary dilatation is noted consistent with post cholecystectomy status. GALLBLADDER AND BILE DUCTS: Status post cholecystectomy. Mild intrahepatic and extrahepatic biliary dilatation is noted consistent with post cholecystectomy status. SPLEEN:  No acute abnormality. PANCREAS: No acute abnormality. ADRENAL GLANDS: No acute abnormality. KIDNEYS, URETERS AND BLADDER: Bilateral renal cortical scarring is noted. No stones in the kidneys or ureters. No hydronephrosis. No perinephric or periureteral stranding. Urinary bladder is unremarkable. GI AND BOWEL: Stomach demonstrates no acute abnormality. There is no bowel obstruction. PERITONEUM AND RETROPERITONEUM: No ascites. No free air. VASCULATURE: Aorta is normal in caliber. LYMPH NODES: No lymphadenopathy. REPRODUCTIVE ORGANS: Status post hysterectomy. BONES AND SOFT TISSUES: Status post surgical posterior fusion of L3-L4 and L4-L5. No focal soft tissue abnormality. IMPRESSION: 1. No acute findings in the abdomen or pelvis. Electronically signed by: Lynwood Seip MD 07/19/2024 01:33 PM EST RP Workstation: [id number]     Procedures   Medications Ordered in the ED  HYDROmorphone  (DILAUDID ) injection 0.5 mg (0.5 mg Intravenous Given 07/19/24 1131)  iohexol  (OMNIPAQUE ) 300 MG/ML solution 100 mL (100 mLs Intravenous Contrast Given 07/19/24 1238)  HYDROmorphone  (DILAUDID ) injection 0.5 mg (0.5 mg Intravenous Given 07/19/24 1359)                                    Medical Decision Making Amount and/or Complexity of Data Reviewed Labs: ordered. Radiology: ordered.  Risk Prescription drug management.   Patient with abdominal pain with possible abdominal hernia and UTI.  She is put on antibiotics and will follow-up with general surgery for the possible hernia     Final diagnoses:  Pain of upper abdomen  Dysuria    ED Discharge Orders          Ordered    ciprofloxacin  (CIPRO ) 500 MG tablet  2 times daily         07/19/24 1408    ondansetron  (ZOFRAN -ODT) 4 MG disintegrating tablet        07/19/24 1408               Suzette Pac, MD 07/20/24 1656

## 2024-07-24 DIAGNOSIS — M5451 Vertebrogenic low back pain: Secondary | ICD-10-CM | POA: Diagnosis not present

## 2024-07-24 DIAGNOSIS — G894 Chronic pain syndrome: Secondary | ICD-10-CM | POA: Diagnosis not present

## 2024-07-24 DIAGNOSIS — M5416 Radiculopathy, lumbar region: Secondary | ICD-10-CM | POA: Diagnosis not present

## 2024-07-24 DIAGNOSIS — Z79891 Long term (current) use of opiate analgesic: Secondary | ICD-10-CM | POA: Diagnosis not present

## 2024-07-24 DIAGNOSIS — M542 Cervicalgia: Secondary | ICD-10-CM | POA: Diagnosis not present

## 2024-07-25 DIAGNOSIS — R609 Edema, unspecified: Secondary | ICD-10-CM | POA: Insufficient documentation

## 2024-07-25 DIAGNOSIS — Z7689 Persons encountering health services in other specified circumstances: Secondary | ICD-10-CM | POA: Diagnosis not present

## 2024-07-25 DIAGNOSIS — M25551 Pain in right hip: Secondary | ICD-10-CM | POA: Diagnosis not present

## 2024-07-25 DIAGNOSIS — R1032 Left lower quadrant pain: Secondary | ICD-10-CM | POA: Diagnosis not present

## 2024-07-25 DIAGNOSIS — R0609 Other forms of dyspnea: Secondary | ICD-10-CM | POA: Insufficient documentation

## 2024-07-25 DIAGNOSIS — N898 Other specified noninflammatory disorders of vagina: Secondary | ICD-10-CM | POA: Diagnosis not present

## 2024-07-25 DIAGNOSIS — R1900 Intra-abdominal and pelvic swelling, mass and lump, unspecified site: Secondary | ICD-10-CM | POA: Diagnosis not present

## 2024-07-25 DIAGNOSIS — R1031 Right lower quadrant pain: Secondary | ICD-10-CM | POA: Diagnosis not present

## 2024-07-30 ENCOUNTER — Other Ambulatory Visit (HOSPITAL_COMMUNITY): Payer: Self-pay | Admitting: Nurse Practitioner

## 2024-07-30 DIAGNOSIS — M25551 Pain in right hip: Secondary | ICD-10-CM

## 2024-08-06 ENCOUNTER — Ambulatory Visit (HOSPITAL_COMMUNITY)
Admission: RE | Admit: 2024-08-06 | Discharge: 2024-08-06 | Disposition: A | Discharge status: Home / Self Care | Source: Ambulatory Visit | Attending: Nurse Practitioner | Admitting: Nurse Practitioner

## 2024-08-06 DIAGNOSIS — S76011A Strain of muscle, fascia and tendon of right hip, initial encounter: Secondary | ICD-10-CM | POA: Diagnosis not present

## 2024-08-06 DIAGNOSIS — S73191A Other sprain of right hip, initial encounter: Secondary | ICD-10-CM | POA: Diagnosis not present

## 2024-08-06 DIAGNOSIS — M25551 Pain in right hip: Secondary | ICD-10-CM | POA: Diagnosis not present

## 2024-08-06 DIAGNOSIS — M7601 Gluteal tendinitis, right hip: Secondary | ICD-10-CM | POA: Diagnosis not present

## 2024-08-07 DIAGNOSIS — M5412 Radiculopathy, cervical region: Secondary | ICD-10-CM | POA: Diagnosis not present

## 2024-08-08 DIAGNOSIS — I129 Hypertensive chronic kidney disease with stage 1 through stage 4 chronic kidney disease, or unspecified chronic kidney disease: Secondary | ICD-10-CM | POA: Diagnosis not present

## 2024-08-08 DIAGNOSIS — R809 Proteinuria, unspecified: Secondary | ICD-10-CM | POA: Diagnosis not present

## 2024-08-08 DIAGNOSIS — D631 Anemia in chronic kidney disease: Secondary | ICD-10-CM | POA: Diagnosis not present

## 2024-08-08 DIAGNOSIS — N1831 Chronic kidney disease, stage 3a: Secondary | ICD-10-CM | POA: Diagnosis not present

## 2024-08-09 ENCOUNTER — Ambulatory Visit: Admitting: General Surgery

## 2024-08-09 ENCOUNTER — Encounter: Payer: Self-pay | Admitting: General Surgery

## 2024-08-09 VITALS — BP 137/86 | HR 67 | Temp 98.6°F | Resp 14 | Ht 67.0 in | Wt 170.0 lb

## 2024-08-09 DIAGNOSIS — R1031 Right lower quadrant pain: Secondary | ICD-10-CM

## 2024-08-09 DIAGNOSIS — G8929 Other chronic pain: Secondary | ICD-10-CM

## 2024-08-09 DIAGNOSIS — N898 Other specified noninflammatory disorders of vagina: Secondary | ICD-10-CM | POA: Insufficient documentation

## 2024-08-09 NOTE — Patient Instructions (Signed)
 Will review your CT scans with the radiologist and see if they note or are concerned for any possible spigelian hernia. This is a hernia on the lateral side of your rectus muscle.   Will talk to your PCP and see if they could do a pelvic exam to look at your vaginal cuff to ensure no one is missing any pathology.  If they are unwilling or cannot do a pelvic, we can refer you to GYN.   As far as any potential enterovaginal fistula (a connection between your small intestine and vagina) this is not seen on the CT scan that you had. Further testing may be needed if symptoms continue.   Will call you after this all is reviewed with additional plans/ information.

## 2024-08-09 NOTE — Progress Notes (Signed)
 [facility] History and Physical  Reason for Referral: RLQ pain ? Chronic  Referring Physician: ED Dewight Norleen PEDLAR, MD   Chief Complaint   New Patient (Initial Visit)     [name] is a 58 y.o. female.  HPI:   Discussed the use of AI scribe software for clinical note transcription with the patient, who gave verbal consent to proceed.  History of Present Illness [name] is a 58 year old female who presents with right-sided abdominal pain and foul-smelling vaginal discharge. She was referred by her primary care doctor for evaluation of a possible enterovaginal fistula or hernia.  She has experienced right-sided abdominal pain for several years, which has recently intensified, described as 'ripping' in the groin area, beginning about three to four weeks ago.  Approximately two months ago, she noticed a green, foul-smelling discharge from her vagina, initially thought to be fecal matter. Despite bathing, a persistent odor resembling feces was present. Treatment with Cipro  for a urinary tract infection temporarily resolved the odor and discharged, but the odor returned after completing the antibiotic course. No current green vaginal discharge but a persistent fecal odor from the vagina.  She has a history of irritable bowel syndrome and experiences significant constipation, sometimes going a week without a bowel movement despite using stool softeners and laxatives. Bowel movements are described as large and painful, with episodes of 'cow patty' like stools and small, cat poop-like stools hanging from the anus.  Her past medical history includes a stroke, congestive heart failure, cardiomyopathy, and stage three renal disease. She reports that she is currently taking oxycodone  for chronic pain management. She also has a family history of colon cancer, with her father having died from it at age 6.  She underwent a colonoscopy within the last three to six months,  during which three polyps were removed. She has been experiencing urinary symptoms, including frequent urination and difficulty voiding.    She has CT scans in the ED and no hernias are noted on the CT reads. No enterovaginal fistulas have been noted. She has a history of a hysterectomy and has not had a pelvic exam in years. Her PCP had her do a vaginal swab for BV and other infections and these appear to have come back negative. They did not do the swab, she went to the restroom and did it herself. The ED also did not do a pelvic exam.    She just also got an MRI of the right hip due to this pain in the right side. At the time of the appt this was not read.   Past Medical History:  Diagnosis Date   Addison's disease (HCC)    Addison's disease (HCC)    Adrenal insufficiency    diagnosed 2012   Aneurysm    Anxiety    Arthritis    Astigmatism    CAD (coronary artery disease)    Cath 2008 EF normal. RCA 50-60, Septal 50%. Myoview  3/12: EF 53% normal perfusion   Cardiac arrest (HCC)    2/2 adissonian crisis   Cardiomyopathy    resolved   Chest pain    chronicc   CHF (congestive heart failure) (HCC)    Chronic back pain    Chronic diarrhea    Concussion    sept 28th 2014   Family history of breast cancer    Family history of colon cancer    Gastroparesis    GERD (gastroesophageal reflux disease)  Headache    migraines   HTN (hypertension)    Hyperlipidemia    Hypothyroidism    Mitral valve prolapse    Nondiabetic gastroparesis    PONV (postoperative nausea and vomiting)    Pre-diabetes    QT prolongation    Stroke [facility])    possible stroke 01/2021   Tobacco abuse    down to 2 cigarettes per day   Vertigo     Past Surgical History:  Procedure Laterality Date   ABDOMINAL HYSTERECTOMY     BIOPSY  12/22/2022   Procedure: BIOPSY;  Surgeon: Burnette Fallow, MD;  Location: [facility];  Service: Gastroenterology;;   CARDIAC CATHETERIZATION  03/07/2007   showed 60%  lesion in the right coronary artery   CHOLECYSTECTOMY     COLONOSCOPY     ESOPHAGOGASTRODUODENOSCOPY (EGD) WITH PROPOFOL  Bilateral 12/22/2022   Procedure: ESOPHAGOGASTRODUODENOSCOPY (EGD) WITH PROPOFOL ;  Surgeon: Burnette Fallow, MD;  Location: [facility];  Service: Gastroenterology;  Laterality: Bilateral;  forward scope and side view   EUS Bilateral 12/22/2022   Procedure: UPPER ENDOSCOPIC ULTRASOUND (EUS) LINEAR;  Surgeon: Burnette Fallow, MD;  Location: [facility];  Service: Gastroenterology;  Laterality: Bilateral;   LEFT HEART CATHETERIZATION WITH CORONARY ANGIOGRAM N/A 04/13/2012   Procedure: LEFT HEART CATHETERIZATION WITH CORONARY ANGIOGRAM;  Surgeon: Debby JONETTA Como, MD;  Location: [facility] C[facility] LAB;  Service: Cardiovascular;  Laterality: N/A;   LUMBAR LAMINECTOMY/DECOMPRESSION MICRODISCECTOMY Left 12/20/2018   Procedure: Left Lumbar Three-Four Lumbar Laminotomy and Foraminotomy, Lumbar Four-Five Laminotomy and Foraminotomy with Microdiscectomy and Resection of Synovial Cyst;  Surgeon: Alix Charleston, MD;  Location: [facility] OR;  Service: Neurosurgery;  Laterality: Left;  Left Lumbar 3-4 Lumbar laminotomy, foraminotomy, possible microdiscectomy with possible resection of synovial cyst   SPINE SURGERY     VARICOSE VEIN SURGERY     VESICOVAGINAL FISTULA CLOSURE W/ TAH      Family History  Problem Relation Age of Onset   Cancer Father        Colon   Coronary artery disease Other    Heart attack Mother        d. 63   Breast cancer Paternal Uncle 2   Dementia Maternal Grandmother    Leukemia Maternal Uncle 1    Social History   Tobacco Use   Smoking status: Former    Current packs/day: 0.00    Average packs/day: 0.3 packs/day for 10.0 years (2.5 ttl pk-yrs)    Types: Cigarettes    Start date: 01/26/2011    Quit date: 01/25/2021    Years since quitting: 3.5    Passive exposure: Past   Smokeless tobacco: Never   Tobacco comments:    smokes 2 a day  Vaping Use   Vaping status:  Former   Substances: Nicotine, CBD  Substance Use Topics   Alcohol use: No   Drug use: Yes    Types: Marijuana, Oxycodone     Comment: History of Cocaine use    Medications: I have reviewed the patient's current medications. Allergies as of 08/09/2024       Reactions   Bee Venom Anaphylaxis   Ibuprofen  Nausea Only   gastroparesis   Penicillins Anaphylaxis, Swelling   Has patient had a PCN reaction causing immediate rash, facial/tongue/throat swelling, SOB or lightheadedness with hypotension: Yes Has patient had a PCN reaction causing severe rash involving mucus membranes or skin necrosis: Yes Has patient had a PCN reaction that required hospitalization No Has patient had a PCN reaction occurring within the last 10 years: No  If all of the above answers are NO, then may proceed with Cephalosporin use.   Cat Dander Hives, Itching, Swelling   SWELLING REACTION UNSPECIFIED    Dust Mite Extract Hives, Swelling   Tramadol Diarrhea   Nsaids Other (See Comments)   Can not take d/t stomach issues        Medication List        Accurate as of August 09, 2024  3:50 PM. If you have any questions, ask your nurse or doctor.          STOP taking these medications    ciprofloxacin  500 MG tablet Commonly known as: Cipro  Stopped by: Manuelita JAYSON Pander   predniSONE  10 MG tablet Commonly known as: DELTASONE  Stopped by: Manuelita JAYSON Pander   sucralfate  1 g tablet Commonly known as: CARAFATE  Stopped by: Manuelita JAYSON Pander   thioridazine 10 MG tablet Commonly known as: MELLARIL Stopped by: Manuelita JAYSON Pander       TAKE these medications    albuterol  108 (90 Base) MCG/ACT inhaler Commonly known as: VENTOLIN  HFA Inhale 2 puffs into the lungs.   albuterol  2 MG tablet Commonly known as: PROVENTIL  Take 2 mg by mouth 3 (three) times daily.   alendronate 70 MG tablet Commonly known as: FOSAMAX Take 70 mg by mouth once a week. Take with a full glass of water on an empty stomach  On Mondays   ALPRAZolam  0.5 MG tablet Commonly known as: XANAX  Take 0.5 mg by mouth 3 (three) times daily as needed for anxiety.   amLODipine  10 MG tablet Commonly known as: NORVASC  Take 10 mg by mouth daily.   atorvastatin  40 MG tablet Commonly known as: LIPITOR TAKE 1 TABLET (40 MG TOTAL) BY MOUTH DAILY. PLEASE KEEP UPCOMING APPOINTMENT FOR FURTHER REFILLS.   Azelastine -Fluticasone  137-50 MCG/ACT Susp Commonly known as: Dymista  Place 2 sprays into both nostrils 1 day or 1 dose.   buPROPion  150 MG 12 hr tablet Commonly known as: WELLBUTRIN  SR Take 150 mg by mouth daily.   CALCIUM  500 PO Take 500 mg by mouth 3 (three) times daily.   carvedilol  6.25 MG tablet Commonly known as: COREG  Take 1 tablet (6.25 mg total) by mouth 2 (two) times daily with a meal.   clobetasol  ointment 0.05 % Commonly known as: TEMOVATE  APPLY TO THE AFFECTED AREA(S) TOPICALLY TWICE DAILY. DO NOT USE ON FACE. APPLY FOR 2 WEEKS MAXIMUM AT A TIME   cyclobenzaprine  10 MG tablet Commonly known as: FLEXERIL  Take 10 mg by mouth 3 (three) times daily as needed for muscle spasms.   dicyclomine  20 MG tablet Commonly known as: BENTYL  Take 20 mg by mouth 3 (three) times daily as needed.   DULoxetine 60 MG capsule Commonly known as: CYMBALTA Take 1 capsule by mouth daily.   EPINEPHrine  0.3 mg/0.3 mL Soaj injection Commonly known as: EPI-PEN Inject 0.3 mg into the muscle as needed for anaphylaxis.   ferrous sulfate 325 (65 FE) MG EC tablet Take 325 mg by mouth.   gabapentin  600 MG tablet Commonly known as: NEURONTIN  Take 600 mg by mouth in the morning, at noon, in the evening, and at bedtime.   hydrocortisone  10 MG tablet Commonly known as: CORTEF  Take 10-20 mg by mouth See admin instructions. Takes 20 mg by mouth in the morning and 10 mg at bedtime   levothyroxine  75 MCG tablet Commonly known as: SYNTHROID  Take 75 mcg by mouth daily before breakfast.   meclizine  25 MG tablet Commonly known  as: ANTIVERT   1 or 2 tabs PO q8h prn dizziness   montelukast 10 MG tablet Commonly known as: SINGULAIR Take 10 mg by mouth at bedtime.   nitroGLYCERIN  0.4 MG SL tablet Commonly known as: NITROSTAT  DISSOLVE 1 TABLET UNDER THE TONGUE EVERY 5 MINUTES AS NEEDED FOR CHEST PAIN. DO NOT EXCEED A TOTAL OF 3 DOSES IN 15 MINUTES.   omeprazole 40 MG capsule Commonly known as: PRILOSEC Take 40 mg by mouth every morning.   ondansetron  4 MG disintegrating tablet Commonly known as: ZOFRAN -ODT 4mg  ODT q4 hours prn nausea/vomit   OxyCONTIN  10 MG 12 hr tablet Generic drug: oxyCODONE  Take 10 mg by mouth every 12 (twelve) hours. What changed: Another medication with the same name was changed. Make sure you understand how and when to take each.   Oxycodone  HCl 20 MG Tabs Take 1 tablet (20 mg total) by mouth every 6 (six) hours as needed for severe pain ((score 7 to 10)). What changed: when to take this   phenazopyridine  200 MG tablet Commonly known as: PYRIDIUM  Take 1 tablet (200 mg total) by mouth 3 (three) times daily.   polyethylene glycol 17 g packet Commonly known as: MiraLax  Take 17 g by mouth daily as needed for moderate constipation.   silver sulfADIAZINE 1 % cream Commonly known as: SILVADENE Apply topically.   tacrolimus  0.1 % ointment Commonly known as: PROTOPIC  Apply topically 2 (two) times daily as needed.   topiramate  200 MG tablet Commonly known as: TOPAMAX  Take 200 mg by mouth 2 (two) times daily.   triamcinolone  ointment 0.1 % Commonly known as: KENALOG  Apply 1 Application topically 2 (two) times daily as needed. DO NOT use on the face. Apply a thin layer to affected skin.   Vitamin D -1000 Max St 25 MCG (1000 UT) tablet Generic drug: Cholecalciferol  Take 1,000 Units by mouth daily.   zolpidem  10 MG tablet Commonly known as: AMBIEN  Take 10 mg by mouth at bedtime.         ROS:  A comprehensive review of systems was negative except for: Constitutional:  positive for headaches Ears, nose, mouth, throat, and face: positive for sinus issues Cardiovascular: positive for HTN Gastrointestinal: positive for abdominal pain and nausea Genitourinary: positive for frequency and retention Musculoskeletal: positive for back pain and neck pain Neurological: positive for dizziness, tremors, and numbness  Endocrine: positive for Addison's disease   Blood pressure 137/86, pulse 67, temperature 98.6 F (37 C), temperature source Oral, resp. rate 14, height 5' 7 (1.702 m), weight 170 lb (77.1 kg), SpO2 97%.  Physical Exam GENERAL: Alert, cooperative, well developed, no acute distress. HEENT: Normocephalic, normal oropharynx, moist mucous membranes. CHEST: Clear to auscultation bilaterally, no wheezes, rhonchi, or crackles. CARDIOVASCULAR: Normal heart rate and rhythm ABDOMEN: Soft, non-tender, non-distended, without organomegaly, normal bowel sounds. Possible? Palpable mass in right lower abdomen, small but I cannot tell if this is muscle or fat coming through muscle  EXTREMITIES: No cyanosis or edema. NEUROLOGICAL: Cranial nerves grossly intact, moves all extremities without gross motor or sensory deficit.  Results: MR HIP RIGHT WO CONTRAST CLINICAL DATA:  Right hip pain  EXAM: MR OF THE RIGHT HIP WITHOUT CONTRAST  TECHNIQUE: Multiplanar, multisequence MR imaging was performed. No intravenous contrast was administered.  COMPARISON:  None Available.  FINDINGS: Bones:  No hip fracture, dislocation or avascular necrosis.  No periosteal reaction or bone destruction. No aggressive osseous lesion.  Normal sacrum and sacroiliac joints. No SI joint widening or erosive changes.  Partially visualized posterior lumbar interbody  fusion at L3-L5.  Articular cartilage and labrum  Articular cartilage: Moderate partial-thickness cartilage loss of the right posterosuperior femoral head and acetabulum.  Labrum: Degenerative nondisplaced tear of the  right anterosuperior labrum extending into the superior labrum.  Joint or bursal effusion  Joint effusion:  No hip joint effusion.  No SI joint effusion.  Bursae:  No bursal fluid.  Muscles and tendons  Flexors: Normal.  Extensors: Normal.  Abductors: Normal.  Adductors: Normal.  Gluteals: Mild muscle strain of the right gluteus medius muscle. Large full-thickness tear of the right gluteus medius tendon insertion. Mild tendinosis of the right gluteus minimus tendon insertion.  Hamstrings: Normal.  Other findings  No pelvic free fluid. No fluid collection or hematoma. No inguinal lymphadenopathy. No inguinal hernia.  IMPRESSION: 1. Large full-thickness tear of the right gluteus medius tendon insertion. Mild muscle strain of the right gluteus medius muscle. 2. Mild tendinosis of the right gluteus minimus tendon insertion. 3. Moderate partial-thickness cartilage loss of the right posterosuperior femoral head and acetabulum. 4. Degenerative nondisplaced tear of the right anterosuperior labrum extending into the superior labrum.  Electronically Signed   By: Julaine Blanch M.D.   On: 08/10/2024 11:58  Reviewed her CT scans from past few months- Sagittal image 67/204 and axilla 68/91 there may be a small defect with fat? No obvious signs of any enterovaginal or colovaginal fistula?   EXAM: CT ABDOMEN AND PELVIS WITH CONTRAST 07/19/2024 12:47:12 PM   TECHNIQUE: CT of the abdomen and pelvis was performed with the administration of intravenous contrast. 100 mL of iohexol  (OMNIPAQUE ) 300 MG/ML solution was administered. Multiplanar reformatted images are provided for review. Automated exposure control, iterative reconstruction, and/or weight-based adjustment of the mA/kV was utilized to reduce the radiation dose to as low as reasonably achievable.   COMPARISON: 06/14/2024   CLINICAL HISTORY: Abdominal pain, acute, nonlocalized.   FINDINGS:   LOWER CHEST: No acute  abnormality.   LIVER: Mild intrahepatic and extrahepatic biliary dilatation is noted consistent with post cholecystectomy status.   GALLBLADDER AND BILE DUCTS: Status post cholecystectomy. Mild intrahepatic and extrahepatic biliary dilatation is noted consistent with post cholecystectomy status.   SPLEEN: No acute abnormality.   PANCREAS: No acute abnormality.   ADRENAL GLANDS: No acute abnormality.   KIDNEYS, URETERS AND BLADDER: Bilateral renal cortical scarring is noted. No stones in the kidneys or ureters. No hydronephrosis. No perinephric or periureteral stranding. Urinary bladder is unremarkable.   GI AND BOWEL: Stomach demonstrates no acute abnormality. There is no bowel obstruction.   PERITONEUM AND RETROPERITONEUM: No ascites. No free air.   VASCULATURE: Aorta is normal in caliber.   LYMPH NODES: No lymphadenopathy.   REPRODUCTIVE ORGANS: Status post hysterectomy.   BONES AND SOFT TISSUES: Status post surgical posterior fusion of L3-L4 and L4-L5. No focal soft tissue abnormality.   IMPRESSION: 1. No acute findings in the abdomen or pelvis.   Electronically signed by: Lynwood Seip MD 07/19/2024 01:33 PM EST RP Workstation: [id number] Assessment & Plan  Chronic right-sided abdominal pain with a possible Spigelian hernia but this is difficult to tell. There are some areas on the CT that could be a defect with fat but definitely no bowel and not causing or constipation.   - Consult radiologist to review CT scans for potential Spigelian hernia. - Consider repeating CT scan with oral contrast and specific positioning/ prone?  - Discuss potential need for surgical intervention if hernia is confirmed.  Possible enterovaginal fistula Intermittent foul-smelling vaginal odor and discharge, previously  treated with Cipro . CT scans inconclusive. Differential includes other infection or small, undetected fistula. Pelvic exam needed to assess vaginal cuff. - Recommend  pelvic exam to assess vaginal cuff by PCP or GYN.  - Discuss potential need for oral contrast study to evaluate for fistula if symptoms persist.   Will call her after I discuss the CT scan with radiology. Since our visit her MRI did come back with a torn tendon so possible that is causing her pain in the right side or at least contributing.   All questions were answered to the satisfaction of the patient.   Manuelita JAYSON Pander 08/09/2024, 3:50 PM

## 2024-08-13 ENCOUNTER — Telehealth: Payer: Self-pay | Admitting: *Deleted

## 2024-08-13 NOTE — Telephone Encounter (Signed)
 Completed notes faxed to PCP.   Call placed to PCP office. States that appt will be needed to see patient prior to referring her to GYN.   PCP will call patient to set up.

## 2024-08-13 NOTE — Telephone Encounter (Signed)
-----   Message from [name] sent at 08/12/2024  7:27 PM EST ----- Regarding: pelvic exam needed Send note to pcp and make sure they can do a pelvic or if they do not want to refer [facility]. She is worried she has a enterovaginal fistula, nothing noted on the CT scan.  No pelvic in years.

## 2024-08-14 ENCOUNTER — Other Ambulatory Visit (HOSPITAL_COMMUNITY)
Admission: RE | Admit: 2024-08-14 | Discharge: 2024-08-14 | Disposition: A | Source: Ambulatory Visit | Attending: Nephrology | Admitting: Nephrology

## 2024-08-14 DIAGNOSIS — D809 Immunodeficiency with predominantly antibody defects, unspecified: Secondary | ICD-10-CM | POA: Insufficient documentation

## 2024-08-14 DIAGNOSIS — N1831 Chronic kidney disease, stage 3a: Secondary | ICD-10-CM | POA: Diagnosis not present

## 2024-08-14 DIAGNOSIS — N189 Chronic kidney disease, unspecified: Secondary | ICD-10-CM | POA: Diagnosis not present

## 2024-08-14 DIAGNOSIS — R300 Dysuria: Secondary | ICD-10-CM | POA: Insufficient documentation

## 2024-08-14 DIAGNOSIS — D631 Anemia in chronic kidney disease: Secondary | ICD-10-CM | POA: Insufficient documentation

## 2024-08-14 DIAGNOSIS — I130 Hypertensive heart and chronic kidney disease with heart failure and stage 1 through stage 4 chronic kidney disease, or unspecified chronic kidney disease: Secondary | ICD-10-CM | POA: Insufficient documentation

## 2024-08-14 DIAGNOSIS — I5022 Chronic systolic (congestive) heart failure: Secondary | ICD-10-CM | POA: Diagnosis not present

## 2024-08-14 LAB — URINALYSIS, W/ REFLEX TO CULTURE (INFECTION SUSPECTED)
Bacteria, UA: NONE SEEN
Bilirubin Urine: NEGATIVE
Glucose, UA: NEGATIVE mg/dL
Ketones, ur: NEGATIVE mg/dL
Nitrite: NEGATIVE
Protein, ur: NEGATIVE mg/dL
Specific Gravity, Urine: 1.003 — ABNORMAL LOW (ref 1.005–1.030)
WBC, UA: >50 WBC/hpf (ref 0–5)
pH: 6 (ref 5.0–8.0)

## 2024-08-15 ENCOUNTER — Other Ambulatory Visit: Payer: Self-pay | Admitting: Allergy & Immunology

## 2024-08-15 DIAGNOSIS — N828 Other female genital tract fistulae: Secondary | ICD-10-CM | POA: Diagnosis not present

## 2024-08-15 DIAGNOSIS — I340 Nonrheumatic mitral (valve) insufficiency: Secondary | ICD-10-CM | POA: Diagnosis not present

## 2024-08-15 DIAGNOSIS — I5022 Chronic systolic (congestive) heart failure: Secondary | ICD-10-CM | POA: Diagnosis not present

## 2024-08-15 DIAGNOSIS — N3289 Other specified disorders of bladder: Secondary | ICD-10-CM | POA: Diagnosis not present

## 2024-08-15 DIAGNOSIS — R102 Pelvic and perineal pain unspecified side: Secondary | ICD-10-CM | POA: Diagnosis not present

## 2024-08-15 DIAGNOSIS — N1831 Chronic kidney disease, stage 3a: Secondary | ICD-10-CM | POA: Diagnosis not present

## 2024-08-15 DIAGNOSIS — I129 Hypertensive chronic kidney disease with stage 1 through stage 4 chronic kidney disease, or unspecified chronic kidney disease: Secondary | ICD-10-CM | POA: Diagnosis not present

## 2024-08-15 LAB — URINE CULTURE: Culture: <10000 — AB

## 2024-08-23 ENCOUNTER — Telehealth (INDEPENDENT_AMBULATORY_CARE_PROVIDER_SITE_OTHER): Admitting: General Surgery

## 2024-08-23 DIAGNOSIS — M5412 Radiculopathy, cervical region: Secondary | ICD-10-CM | POA: Diagnosis not present

## 2024-08-23 DIAGNOSIS — Z6826 Body mass index (BMI) 26.0-26.9, adult: Secondary | ICD-10-CM | POA: Diagnosis not present

## 2024-08-23 DIAGNOSIS — G8929 Other chronic pain: Secondary | ICD-10-CM

## 2024-08-23 DIAGNOSIS — R1031 Right lower quadrant pain: Secondary | ICD-10-CM

## 2024-08-23 NOTE — Telephone Encounter (Signed)
 Patient is scheduled for GYN evaluation on 09/20/2024 with [facility].

## 2024-08-23 NOTE — Telephone Encounter (Signed)
 [facility]  Talked to Dr. Landy Radiologist who read the scan. Discussed the area in the RLQ and it is possible she has a very small <1cm defect with fat in the RLQ / upper inguinal region.   He does not think repeat CT will add anything, suggested doing a US  of that localized area to see if the fat moves in and out.   Will get the office to set her up with an US  and can call her with the results.  No evidence of any colovaginal fistula on her CT and she needs to get an pelvic exam with her PCP or can get referred to GYN if she continues to have drainage issues.   US  limited RLQ possible inguinal/ lower spigelian hernia. Patient will nee to point to the area for US .   Joy Pander, MD [facility] [address] [place], [place] [address] [phone] (office)

## 2024-08-23 NOTE — Telephone Encounter (Signed)
 Call placed to patient and patient made aware of provider recommendations and imaging orders.   Agreeable to plan.

## 2024-08-24 ENCOUNTER — Telehealth: Payer: Self-pay

## 2024-08-24 NOTE — Telephone Encounter (Signed)
 1st attempt to reach pt regarding surgical clearance and the need for an IN OFFICE appointment. Unable to LVM as VM box is currently full.

## 2024-08-24 NOTE — Telephone Encounter (Signed)
" ° °  Name: [name]  DOB: [birth year]  MRN: [id number]  Primary Cardiologist: Shelda Bruckner, MD  Chart reviewed as part of pre-operative protocol coverage. Because of [name]'s past medical history and time since last visit, she will require a follow-up in-office visit in order to better assess preoperative cardiovascular risk.  Pre-op covering staff: - Please schedule appointment and call patient to inform them. If patient already had an upcoming appointment within acceptable timeframe, please add pre-op clearance to the appointment notes so provider is aware. - Please contact requesting surgeon's office via preferred method (i.e, phone, fax) to inform them of need for appointment prior to surgery.  No medications indicated as needing held.  [name], PA-C  08/24/2024, 1:52 PM   "

## 2024-08-24 NOTE — Telephone Encounter (Signed)
"  ° °  Pre-operative Risk Assessment    Patient Name: [name]  DOB: 1966/06/24 MRN: [id number]   Date of last office visit: 04/12/24 Date of next office visit: TBD   Request for Surgical Clearance    Procedure:  C7-T1 Posterior Cervical Fusion w/lateral mas  Date of Surgery:  Clearance TBD                                Surgeon:  Alm CANDIE Molt Surgeon's Group or Practice Name:  [facility] Phone number:  [phone] Fax number:  [phone]   Type of Clearance Requested:   - Medical    Type of Anesthesia:  Not Indicated   Additional requests/questions:    [name]   08/24/2024, 12:15 PM   "

## 2024-08-24 NOTE — Telephone Encounter (Signed)
 OV preop clearance appt now scheduled

## 2024-08-24 NOTE — Telephone Encounter (Signed)
 Pt returned call to f/u please advise

## 2024-08-28 ENCOUNTER — Ambulatory Visit (HOSPITAL_COMMUNITY)
Admission: RE | Admit: 2024-08-28 | Discharge: 2024-08-28 | Disposition: A | Discharge status: Home / Self Care | Source: Ambulatory Visit | Attending: General Surgery | Admitting: General Surgery

## 2024-08-28 DIAGNOSIS — G8929 Other chronic pain: Secondary | ICD-10-CM | POA: Insufficient documentation

## 2024-08-28 DIAGNOSIS — R1031 Right lower quadrant pain: Secondary | ICD-10-CM | POA: Diagnosis not present

## 2024-09-05 ENCOUNTER — Encounter: Payer: Self-pay | Admitting: Orthopedic Surgery

## 2024-09-05 ENCOUNTER — Ambulatory Visit: Admitting: Allergy & Immunology

## 2024-09-05 DIAGNOSIS — Z7952 Long term (current) use of systemic steroids: Secondary | ICD-10-CM | POA: Insufficient documentation

## 2024-09-05 DIAGNOSIS — G35D Multiple sclerosis, unspecified: Secondary | ICD-10-CM | POA: Insufficient documentation

## 2024-09-10 ENCOUNTER — Encounter: Admitting: Orthopedic Surgery

## 2024-09-10 ENCOUNTER — Ambulatory Visit: Payer: Self-pay | Admitting: General Surgery

## 2024-09-10 NOTE — Progress Notes (Signed)
 Let the patient know that there is no obvious hernia or mass in the area where the US  was performed. The Radiologist did not think she had a hernia and did not feel that repeat CT was indicated. At this time, I would say if the area gets larger she would need a repeat CT and her PCP can get that for her. No indication for any hernia surgery right now.

## 2024-09-18 ENCOUNTER — Encounter (HOSPITAL_BASED_OUTPATIENT_CLINIC_OR_DEPARTMENT_OTHER): Payer: Self-pay | Admitting: Family

## 2024-09-18 ENCOUNTER — Ambulatory Visit (HOSPITAL_BASED_OUTPATIENT_CLINIC_OR_DEPARTMENT_OTHER): Admitting: Family

## 2024-09-18 VITALS — BP 134/90 | HR 76 | Ht 67.0 in | Wt 170.7 lb

## 2024-09-18 DIAGNOSIS — Z01810 Encounter for preprocedural cardiovascular examination: Secondary | ICD-10-CM | POA: Diagnosis not present

## 2024-09-18 DIAGNOSIS — E785 Hyperlipidemia, unspecified: Secondary | ICD-10-CM

## 2024-09-18 DIAGNOSIS — I2510 Atherosclerotic heart disease of native coronary artery without angina pectoris: Secondary | ICD-10-CM

## 2024-09-18 DIAGNOSIS — I10 Essential (primary) hypertension: Secondary | ICD-10-CM | POA: Diagnosis not present

## 2024-09-18 NOTE — Progress Notes (Signed)
 " Cardiology Office Note   Date:  09/18/2024  ID:  PAISLEIGH MARONEY, DOB 16-Dec-1965, MRN [id number] PCP: Shona Norleen PEDLAR, MD  [facility] Providers Cardiologist:  Shelda Bruckner, MD     History of Present Illness SALEM MASTROGIOVANNI is a 59 y.o. female with a hx of hypertension, hyperlipidemia, nonobstructive CAD, CVA 01/2021, prior tobacco use, Addison's, neuropathy.   [name], grade 1 diastolic dysfunction.  Lexiscan  Myoview  01/08/2020 with breast attenuation but no ischemia, low risk study.  Echo 10/2020 EF 55 to 60%, grade 1 diastolic dysfunction, normal RV function, no significant valvular disease, normal wall motion.  She was started on Lasix  due to lower extremity edema.  Of note she was on steroids at the time due to her Addison's disease.   Hospitalized 01/2021 due to acute CVA and found to have unimpressive  punctate acute or subacute infarction.  EEG with left temporal epileptic form discharges.  She was transitioned from aspirin  to Plavix .   Cardiac CTA 03/25/22 coronary calcium  score 337 placing her in the 90th percentile for age, sex, race matched controls.  Overall minimal atherosclerosis (LAD, RCA less than 25% stenosis)  She was seen 04/12/2024 after ED visit that the presented central chest pain, heart racing, sensation of being squeezed.  Most often attributed to stress.Her symptoms were atypical for angina and ischemic evaluation not recommended.  Presents today for preoperative clearance for C7-T1 posterior cervical fusion with lateral mass. Her activity can be limited due to neck pain but she has continued to maintain overall normal activities, including caring for her grandchildren.   BP in office 150/90, but she is due for pain medicine at this time and has held off since she will be driving home from her appointment. She has noticed a trend of higher readings lately that she attributes to  significant neck pain. She did have a reading of 134/80 last week.   She denies shortness of breath, chest pain, palpitations, orthopnea, swelling, early satiety, n/v.   ROS: Please see the history of present illness.    All other systems reviewed and are negative.   Studies Reviewed EKG Interpretation Date/Time:  Tuesday September 18 2024 14:00:03 EST Ventricular Rate:  76 PR Interval:  132 QRS Duration:  92 QT Interval:  406 QTC Calculation: 456 R Axis:   84  Text Interpretation: Normal sinus rhythm  No acute ST/T wave changes Confirmed by Vannie Mora ([id number]) on 09/18/2024 2:07:20 PM    Cardiac Studies & Procedures   ______________________________________________________________________________________________   STRESS TESTS  MYOCARDIAL PERFUSION IMAGING 01/08/2020  Interpretation Summary  Nuclear stress EF: 59%.  The left ventricular ejection fraction is normal (55-65%).  There was no ST segment deviation noted during stress.  Defect 1: There is a medium defect of moderate severity present in the mid anterior and apical anterior location.  This is a low risk study.  Mildly abnormal, low risk stress nuclear study with probable shifting breast attenuation.  Cannot rule out very mild anterior ischemia.  Gated ejection fraction 59% with normal wall motion.   ECHOCARDIOGRAM  ECHOCARDIOGRAM COMPLETE 09/08/2023  Narrative ECHOCARDIOGRAM REPORT    Patient Name:   ALENE BERGERSON Date of Exam: 09/08/2023 Medical Rec #:  [id number]      Height:       65.0 in Accession #:    [id number]     Weight:       173.8 lb Date  of Birth:  [birth year]       BSA:          1.864 m Patient Age:    57 years       BP:           136/78 mmHg Patient Gender: F              HR:           64 bpm. Exam Location:  Outpatient  Procedure: 2D Echo, 3D Echo, Cardiac Doppler, Color Doppler and Strain Analysis  Indications:    Essential hypertension [I10 (ICD-10-CM)], Shortness of  breath [786.05.ICD-9-CM]  History:        Patient has prior history of Echocardiogram examinations, most recent 01/26/2021. Stroke; Risk Factors:Hypertension, Dyslipidemia and Current Smoker.  Sonographer:    Rosaline Fujisawa MHA, RDMS, RVT, RDCS Referring Phys: [id number] LAMARR HERO LAWRENCE   Sonographer Comments: Global longitudinal strain was attempted. IMPRESSIONS   1. Septal and inferior basal hypokinesis . Left ventricular ejection fraction, by estimation, is 50 to 55%. The left ventricle has low normal function. The left ventricle demonstrates regional wall motion abnormalities (see scoring diagram/findings for description). The left ventricular internal cavity size was mildly dilated. Left ventricular diastolic parameters were normal. The average left ventricular global longitudinal strain is -13.8 %. The global longitudinal strain is abnormal. 2. Right ventricular systolic function is normal. The right ventricular size is normal. There is normal pulmonary artery systolic pressure. 3. Left atrial size was mildly dilated. 4. The mitral valve is abnormal. Mild mitral valve regurgitation. No evidence of mitral stenosis. 5. Mean gradient only 5 mmhg with DVI 0.71 mild stenosis by Area with low LVOT diameter recorded. The aortic valve was not well visualized. There is moderate calcification of the aortic valve. There is moderate thickening of the aortic valve. Aortic valve regurgitation is not visualized. Mild aortic valve stenosis. 6. The inferior vena cava is normal in size with greater than 50% respiratory variability, suggesting right atrial pressure of 3 mmHg.  FINDINGS Left Ventricle: Septal and inferior basal hypokinesis. Left ventricular ejection fraction, by estimation, is 50 to 55%. The left ventricle has low normal function. The left ventricle demonstrates regional wall motion abnormalities. The average left ventricular global longitudinal strain is -13.8 %. The global longitudinal  strain is abnormal. The left ventricular internal cavity size was mildly dilated. There is no left ventricular hypertrophy. Left ventricular diastolic parameters were normal.  Right Ventricle: The right ventricular size is normal. No increase in right ventricular wall thickness. Right ventricular systolic function is normal. There is normal pulmonary artery systolic pressure. The tricuspid regurgitant velocity is 1.28 m/s, and with an assumed right atrial pressure of 3 mmHg, the estimated right ventricular systolic pressure is 9.6 mmHg.  Left Atrium: Left atrial size was mildly dilated.  Right Atrium: Right atrial size was normal in size.  Pericardium: There is no evidence of pericardial effusion.  Mitral Valve: The mitral valve is abnormal. There is mild thickening of the mitral valve leaflet(s). Mild mitral annular calcification. Mild mitral valve regurgitation. No evidence of mitral valve stenosis.  Tricuspid Valve: The tricuspid valve is normal in structure. Tricuspid valve regurgitation is not demonstrated. No evidence of tricuspid stenosis.  Aortic Valve: Mean gradient only 5 mmhg with DVI 0.71 mild stenosis by Area with low LVOT diameter recorded. The aortic valve was not well visualized. There is moderate calcification of the aortic valve. There is moderate thickening of the aortic valve. Aortic valve regurgitation is  not visualized. Mild aortic stenosis is present. Aortic valve mean gradient measures 5.0 mmHg. Aortic valve peak gradient measures 8.5 mmHg. Aortic valve area, by VTI measures 1.55 cm.  Pulmonic Valve: The pulmonic valve was normal in structure. Pulmonic valve regurgitation is not visualized. No evidence of pulmonic stenosis.  Aorta: The aortic root is normal in size and structure.  Venous: The inferior vena cava is normal in size with greater than 50% respiratory variability, suggesting right atrial pressure of 3 mmHg.  IAS/Shunts: No atrial level shunt detected by  color flow Doppler.   LEFT VENTRICLE PLAX 2D LVIDd:         5.27 cm   Diastology LVIDs:         3.78 cm   LV e' medial:    7.94 cm/s LV PW:         0.74 cm   LV E/e' medial:  10.5 LV IVS:        0.87 cm   LV e' lateral:   9.14 cm/s LVOT diam:     1.66 cm   LV E/e' lateral: 9.1 LV SV:         49 LV SV Index:   26        2D Longitudinal Strain LVOT Area:     2.16 cm  2D Strain GLS Avg:     -13.8 %  3D Volume EF: 3D EF:        56 % LV EDV:       143 ml LV ESV:       63 ml LV SV:        81 ml  RIGHT VENTRICLE RV Basal diam:  2.69 cm RV Mid diam:    2.16 cm  LEFT ATRIUM             Index        RIGHT ATRIUM           Index LA diam:        3.78 cm 2.03 cm/m   RA Area:     11.60 cm LA Vol (A2C):   67.2 ml 36.06 ml/m  RA Volume:   21.70 ml  11.64 ml/m LA Vol (A4C):   74.9 ml 40.19 ml/m LA Biplane Vol: 71.0 ml 38.10 ml/m AORTIC VALVE AV Area (Vmax):    1.47 cm AV Area (Vmean):   1.43 cm AV Area (VTI):     1.55 cm AV Vmax:           146.00 cm/s AV Vmean:          97.500 cm/s AV VTI:            0.319 m AV Peak Grad:      8.5 mmHg AV Mean Grad:      5.0 mmHg LVOT Vmax:         99.20 cm/s LVOT Vmean:        64.200 cm/s LVOT VTI:          0.228 m LVOT/AV VTI ratio: 0.71  AORTA Ao Root diam: 2.82 cm  MITRAL VALVE               TRICUSPID VALVE MV Area (PHT): 2.62 cm    TR Peak grad:   6.6 mmHg MV Decel Time: 290 msec    TR Vmax:        128.00 cm/s MR Peak grad: 75.7 mmHg MR Vmax:      435.00 cm/s  SHUNTS MV E velocity:  83.30 cm/s  Systemic VTI:  0.23 m MV A velocity: 96.90 cm/s  Systemic Diam: 1.66 cm MV E/A ratio:  0.86  Maude Emmer MD Electronically signed by Maude Emmer MD Signature Date/Time: 09/08/2023/12:09:04 PM    Final    MONITORS  LONG TERM MONITOR (3-14 DAYS) 11/21/2020  Narrative Monitor shows 2 short runs of SVT (<8 beats) and infrequent (<1%) isolated PVC's.  Vinie KYM Maxcy, MD, [facility], FACP [place]  [facility] Medical Director  of the [facility] Lipid Disorders [facility] Cardiovascular Risk Reduction [facility] Diplomate of the American Board of Clinical Lipidology Attending Cardiologist Direct Dial: [phone]  Fax: [phone] Website:  [email or web address]   CT SCANS  CT CORONARY MORPH W/CTA COR W/SCORE 03/25/2022  Addendum 03/25/2022  4:49 PM ADDENDUM REPORT: 03/25/2022 16:47  CLINICAL DATA:  Chest pain  EXAM: Cardiac/Coronary CTA  TECHNIQUE: A non-contrast, gated CT scan was obtained with axial slices of 3 mm through the heart for calcium  scoring. Calcium  scoring was performed using the Agatston method. A 120 kV prospective, gated, contrast cardiac scan was obtained. Gantry rotation speed was 250 msecs and collimation was 0.6 mm. Two sublingual nitroglycerin  tablets (0.8 mg) were given. The 3D data set was reconstructed in 5% intervals of the 35-75% of the R-R cycle. Diastolic phases were analyzed on a dedicated workstation using MPR, MIP, and VRT modes. The patient received 95 cc of contrast.  FINDINGS: Image quality: Excellent.  Noise artifact is: Limited.  Coronary Arteries:  Normal coronary origin.  Right dominance.  Left main: The left main is a large caliber vessel with a normal take off from the left coronary cusp that bifurcates into a LAD and LCx. There is no plaque or stenosis.  Left anterior descending artery: The LAD gives off 2 patent diagonal branches. There is minimal soft plaque in the proximal and mid LAD with associated stenosis of <25%.  Left circumflex artery: The LCX is non-dominant and patent with no evidence of plaque or stenosis. The LCX gives off a patent obtuse marginal branch.  Right coronary artery: The RCA is dominant with normal take off from the right coronary cusp. The RCA terminates as a PDA and right posterolateral branch without evidence of plaque or stenosis. There is minimal soft plaque in the RCA with associated stenosis of <25%.  Right Atrium: Right atrial  size is within normal limits.  Right Ventricle: The right ventricular cavity is within normal limits.  Left Atrium: Left atrial size is normal in size with no left atrial appendage filling defect.  Left Ventricle: The ventricular cavity size is within normal limits. There are no stigmata of prior infarction. There is no abnormal filling defect.  Pulmonary arteries: Normal in size without proximal filling defect.  Pulmonary veins: Normal pulmonary venous drainage.  Pericardium: Normal thickness with no significant effusion or calcium  present.  Cardiac valves: The aortic valve is trileaflet without significant calcification. The mitral valve is normal structure without significant calcification.  Aorta: Normal caliber with no significant disease.  Extra-cardiac findings: See attached radiology report for non-cardiac structures.  IMPRESSION: 1. Coronary calcium  score of 337. This was 98th percentile for age-, sex, and race-matched controls.  2.  Normal coronary origin with right dominance.  3.  Minimal atherosclerosis.  CAD RADS 1.  4.  Consider non atherosclerotic causes of chest pain.  5.  Recommend preventive therapy and risk factor modification.  RECOMMENDATIONS: 1. CAD-RADS 0: No evidence of CAD (0%). Consider non-atherosclerotic causes of chest pain.  2. CAD-RADS 1: Minimal  non-obstructive CAD (0-24%). Consider non-atherosclerotic causes of chest pain. Consider preventive therapy and risk factor modification.  3. CAD-RADS 2: Mild non-obstructive CAD (25-49%). Consider non-atherosclerotic causes of chest pain. Consider preventive therapy and risk factor modification.  4. CAD-RADS 3: Moderate stenosis. Consider symptom-guided anti-ischemic pharmacotherapy as well as risk factor modification per guideline directed care. Additional analysis with CT FFR will be submitted.  5. CAD-RADS 4: Severe stenosis. (70-99% or > 50% left main). Cardiac catheterization or  CT FFR is recommended. Consider symptom-guided anti-ischemic pharmacotherapy as well as risk factor modification per guideline directed care. Invasive coronary angiography recommended with revascularization per published guideline statements.  6. CAD-RADS 5: Total coronary occlusion (100%). Consider cardiac catheterization or viability assessment. Consider symptom-guided anti-ischemic pharmacotherapy as well as risk factor modification per guideline directed care.  7. CAD-RADS N: Non-diagnostic study. Obstructive CAD can't be excluded. Alternative evaluation is recommended.  Wilbert Bihari, MD   Electronically Signed By: Wilbert Bihari M.D. On: 03/25/2022 16:47  Narrative EXAM: OVER-READ INTERPRETATION  CT CHEST  The following report is a limited chest CT over-read performed by radiologist Dr. Franky Crease of [facility] on 03/25/2022. This over-read does not include interpretation of cardiac or coronary anatomy or pathology. The coronary CTA interpretation by the cardiologist is attached.  COMPARISON:  03/15/2022  FINDINGS: Vascular: Heart is normal size.  Aorta normal caliber.  Mediastinum/Nodes: No adenopathy  Lungs/Pleura: Bibasilar ground gas opacities, favor atelectasis. No effusions.  Upper Abdomen: No acute findings  Musculoskeletal: Chest wall soft tissues are unremarkable. No acute bony abnormality.  IMPRESSION: No acute or significant extra cardiac abnormality.  Electronically Signed: By: Franky Crease M.D. On: 03/25/2022 16:11     ______________________________________________________________________________________________      Risk Assessment/Calculations   HYPERTENSION CONTROL Vitals:   09/18/24 1404 09/18/24 1646  BP: (!) 150/90 (!) 134/90    The patient's blood pressure is elevated above target today.  In order to address the patient's elevated BP: Blood pressure will be monitored at home to determine if medication changes  need to be made.          Physical Exam VS:  BP (!) 134/90 Comment: home BP  Pulse 76   Ht 5' 7 (1.702 m)   Wt 170 lb 11.2 oz (77.4 kg)   SpO2 96%   BMI 26.74 kg/m   Vitals:   09/18/24 1404 09/18/24 1646  BP: (!) 150/90 (!) 134/90 Comment: home BP  Pulse: 76   Height: 5' 7 (1.702 m)   Weight: 170 lb 11.2 oz (77.4 kg)   SpO2: 96%   BMI (Calculated): 26.73          Wt Readings from Last 3 Encounters:  09/18/24 170 lb 11.2 oz (77.4 kg)  08/09/24 170 lb (77.1 kg)  07/19/24 165 lb (74.8 kg)    GEN: Well nourished, well developed in no acute distress NECK: No JVD; No carotid bruits CARDIAC: RRR, no murmurs, rubs, gallops RESPIRATORY:  Clear to auscultation without rales, wheezing or rhonchi  ABDOMEN: Soft, non-tender, non-distended EXTREMITIES:  No edema; No deformity   ASSESSMENT AND PLAN  Preop - According to the Revised Cardiac Risk Index (RCRI), her Perioperative Risk of Major Cardiac Event is (%): 6.6. Her Functional Capacity in METs is: 6.36 according to the [employer] Activity Status Index (DASI). Per AHA/ACC guidelines, she is deemed acceptable risk for the planned procedure without additional cardiovascular testing. Will route to surgical team so they are aware.    CAD - CT coronary  7/23 with Ca score 337, minimal nonobstructive CAD, LAD, RCA <25% stenosis. Denies chest pain, shortness of breath, orthopnea, palpitations. GDMT atorvastatin  40mg  daily, coreg  6.25mg  BID. Recommend aiming for 150 minutes of moderate intensity activity per week and following a heart healthy diet.    HLD, LDL goal <55 - Most recent LDL from 10/25 was 69. She has a PCP appointment in a few weeks. Recommended recheck of lipids. Given handout on diet and lifestyle to reduce LDL. If LDL persistently >55 consider increasing Atorvastatin  to 80mg  daily.   HTN - BP in office 150/80. Reports recent higher BPs that she attributes to her neck pain. She does check her BP at home and had a 134/80 reading  last week. She is taking amlodipine  10mg  daily and carvedilol  6.25mg  BID as prescribed. Discussed the option to increase her carvedilol  if her HTN continues. She has a PCP follow-up in a few weeks.       Dispo: F/u with cardiology in 6 months or sooner if necessary. Continue to follow blood pressure and lipids closely.   Signed, Reche GORMAN Finder, NP   "

## 2024-09-18 NOTE — Patient Instructions (Addendum)
 Medication Instructions:  Continue your current medications.   If your blood pressure is consistently more than 130/80, let us  or primary care know as we may change your dose of Carvedilol .  If your LDL (bad cholesterol)  is consistently more than 55 on future labs we may consider increasing your dose of Atorvastatin .   *If you need a refill on your cardiac medications before your next appointment, please call your pharmacy*  Testing/Procedures: Your EKG today looked great  Follow-Up: At [facility], you and your health needs are our priority.  As part of our continuing mission to provide you with exceptional heart care, our providers are all part of one team.  This team includes your primary Cardiologist (physician) and Advanced Practice Providers or APPs (Physician Assistants and Nurse Practitioners) who all work together to provide you with the care you need, when you need it.  Your next appointment:   6 month(s)  Provider:   Shelda Bruckner, MD, Rosaline Bane, NP, or Reche Finder, NP    We recommend signing up for the patient portal called MyChart.  Sign up information is provided on this After Visit Summary.  MyChart is used to connect with patients for Virtual Visits (Telemedicine).  Patients are able to view lab/test results, encounter notes, upcoming appointments, etc.  Non-urgent messages can be sent to your provider as well.   To learn more about what you can do with MyChart, go to [email or web address].   Other Instructions  Reche will send a note to your surgeon today.

## 2024-09-19 ENCOUNTER — Other Ambulatory Visit: Payer: Self-pay | Admitting: Neurological Surgery

## 2024-09-20 ENCOUNTER — Ambulatory Visit: Admitting: Obstetrics & Gynecology

## 2024-09-20 ENCOUNTER — Encounter: Payer: Self-pay | Admitting: Obstetrics & Gynecology

## 2024-09-20 VITALS — BP 100/69 | HR 73 | Ht 67.0 in | Wt 168.2 lb

## 2024-09-20 DIAGNOSIS — B9689 Other specified bacterial agents as the cause of diseases classified elsewhere: Secondary | ICD-10-CM | POA: Diagnosis not present

## 2024-09-20 DIAGNOSIS — N823 Fistula of vagina to large intestine: Secondary | ICD-10-CM | POA: Diagnosis not present

## 2024-09-20 DIAGNOSIS — N760 Acute vaginitis: Secondary | ICD-10-CM | POA: Diagnosis not present

## 2024-09-20 DIAGNOSIS — Z90710 Acquired absence of both cervix and uterus: Secondary | ICD-10-CM | POA: Diagnosis not present

## 2024-09-20 MED ORDER — METRONIDAZOLE 0.75 % VA GEL: VAGINAL | 0 refills | Status: AC

## 2024-09-20 NOTE — Progress Notes (Signed)
 "     Chief Complaint  Patient presents with   Gynecologic Exam    Thinks she has stool coming from vagina      59 y.o. [id number] No LMP recorded. Patient has had a hysterectomy. The current method of family planning is status post hysterectomy.  Outpatient Encounter Medications as of 09/20/2024  Medication Sig Note   albuterol  (PROVENTIL ) 2 MG tablet Take 2 mg by mouth 3 (three) times daily.    albuterol  (VENTOLIN  HFA) 108 (90 Base) MCG/ACT inhaler Inhale 2 puffs into the lungs every 6 (six) hours as needed for wheezing or shortness of breath.    alendronate (FOSAMAX) 70 MG tablet Take 70 mg by mouth once a week. Take with a full glass of water on an empty stomach On Mondays    ALPRAZolam  (XANAX ) 0.5 MG tablet Take 0.5 mg by mouth 3 (three) times daily as needed for anxiety.    amLODipine  (NORVASC ) 10 MG tablet Take 10 mg by mouth daily.    atorvastatin  (LIPITOR) 40 MG tablet TAKE 1 TABLET (40 MG TOTAL) BY MOUTH DAILY. PLEASE KEEP UPCOMING APPOINTMENT FOR FURTHER REFILLS.    Azelastine -Fluticasone  137-50 MCG/ACT SUSP INSTILL 2 SPRAYS IN EACH NOSTRIL ONCE DAILY    buPROPion  (WELLBUTRIN  SR) 150 MG 12 hr tablet Take 150 mg by mouth daily.    Calcium  Carbonate (CALCIUM  500 PO) Take 500 mg by mouth 3 (three) times daily.    carvedilol  (COREG ) 6.25 MG tablet Take 1 tablet (6.25 mg total) by mouth 2 (two) times daily with a meal.    Cholecalciferol  (VITAMIN D -1000 MAX ST) 25 MCG (1000 UT) tablet Take 1,000 Units by mouth daily.    clobetasol  ointment (TEMOVATE ) 0.05 % APPLY TO THE AFFECTED AREA(S) TOPICALLY TWICE DAILY. DO NOT USE ON FACE. APPLY FOR 2 WEEKS MAXIMUM AT A TIME    cyclobenzaprine  (FLEXERIL ) 10 MG tablet Take 10 mg by mouth 3 (three) times daily as needed for muscle spasms.    dicyclomine  (BENTYL ) 20 MG tablet Take 20 mg by mouth 3 (three) times daily as needed.    DULoxetine (CYMBALTA) 60 MG capsule Take 1 capsule by mouth daily.    EPINEPHrine  0.3 mg/0.3 mL IJ SOAJ injection  Inject 0.3 mg into the muscle as needed for anaphylaxis.    ferrous sulfate 325 (65 FE) MG EC tablet Take 325 mg by mouth daily.    gabapentin  (NEURONTIN ) 600 MG tablet Take 600 mg by mouth in the morning, at noon, in the evening, and at bedtime.    hydrocortisone  (CORTEF ) 10 MG tablet Take 10-20 mg by mouth See admin instructions. Takes 20 mg by mouth in the morning and 10 mg at bedtime    levothyroxine  (SYNTHROID ) 75 MCG tablet Take 75 mcg by mouth daily before breakfast. 08/09/2024: Must be name brand    meclizine  (ANTIVERT ) 25 MG tablet 1 or 2 tabs PO q8h prn dizziness    metroNIDAZOLE  (METROGEL ) 0.75 % vaginal gel 1 applicator every other night for 5 applications or 10 nights    montelukast (SINGULAIR) 10 MG tablet Take 10 mg by mouth at bedtime.    nitroGLYCERIN  (NITROSTAT ) 0.4 MG SL tablet DISSOLVE 1 TABLET UNDER THE TONGUE EVERY 5 MINUTES AS NEEDED FOR CHEST PAIN. DO NOT EXCEED A TOTAL OF 3 DOSES IN 15 MINUTES.    omeprazole (PRILOSEC) 40 MG capsule Take 40 mg by mouth every morning.    ondansetron  (ZOFRAN -ODT) 4 MG disintegrating tablet 4mg  ODT q4 hours prn nausea/vomit  OXYCONTIN  10 MG 12 hr tablet Take 10 mg by mouth every 12 (twelve) hours. 08/09/2024: [facility] in [place]   polyethylene glycol (MIRALAX ) 17 g packet Take 17 g by mouth daily as needed for moderate constipation.    silver sulfADIAZINE (SILVADENE) 1 % cream Apply topically.    tacrolimus  (PROTOPIC ) 0.1 % ointment Apply topically 2 (two) times daily as needed.    topiramate  (TOPAMAX ) 200 MG tablet Take 200 mg by mouth 2 (two) times daily.    triamcinolone  ointment (KENALOG ) 0.1 % Apply 1 Application topically 2 (two) times daily as needed. DO NOT use on the face. Apply a thin layer to affected skin.    zolpidem  (AMBIEN ) 10 MG tablet Take 10 mg by mouth at bedtime.    oxyCODONE  (ROXICODONE ) 15 MG immediate release tablet Take 15 mg by mouth every 6 (six) hours as needed for pain.    No facility-administered  encounter medications on file as of 09/20/2024.    Subjective I am seeing patient as a referral for concern of stool per vagina Now has resolved Started about 2 months ago, she reports slimy green discharge with unpleasant odor No air No history of diverticulitis or pelvic radiation No recent pelvic abdominal surgery 6 months ago did have 3 colon polyps removed at colonoscopy and this started 4 months later  Except for some odor all symptoms have resolved  CT scan was negative   Past Medical History:  Diagnosis Date   Addison's disease (HCC)    Addison's disease (HCC)    Adrenal insufficiency    diagnosed 2012   Aneurysm    Anxiety    Arthritis    Astigmatism    CAD (coronary artery disease)    Cath 2008 EF normal. RCA 50-60, Septal 50%. Myoview  3/12: EF 53% normal perfusion   Cardiac arrest (HCC)    2/2 adissonian crisis   Cardiomyopathy    resolved   Chest pain    chronicc   CHF (congestive heart failure) (HCC)    Chronic back pain    Chronic diarrhea    Concussion    sept 28th 2014   Family history of breast cancer    Family history of colon cancer    Gastroparesis    GERD (gastroesophageal reflux disease)    Headache    migraines   HTN (hypertension)    Hyperlipidemia    Hypothyroidism    Mitral valve prolapse    Nondiabetic gastroparesis    PONV (postoperative nausea and vomiting)    Pre-diabetes    QT prolongation    Renal impairment 07/02/2024   Stroke [facility])    possible stroke 01/2021   Tobacco abuse    down to 2 cigarettes per day   Vertigo     Past Surgical History:  Procedure Laterality Date   ABDOMINAL HYSTERECTOMY     BIOPSY  12/22/2022   Procedure: BIOPSY;  Surgeon: Burnette Fallow, MD;  Location: [facility];  Service: Gastroenterology;;   CARDIAC CATHETERIZATION  03/07/2007   showed 60% lesion in the right coronary artery   CHOLECYSTECTOMY     COLONOSCOPY     ESOPHAGOGASTRODUODENOSCOPY (EGD) WITH PROPOFOL  Bilateral 12/22/2022    Procedure: ESOPHAGOGASTRODUODENOSCOPY (EGD) WITH PROPOFOL ;  Surgeon: Burnette Fallow, MD;  Location: [facility];  Service: Gastroenterology;  Laterality: Bilateral;  forward scope and side view   EUS Bilateral 12/22/2022   Procedure: UPPER ENDOSCOPIC ULTRASOUND (EUS) LINEAR;  Surgeon: Burnette Fallow, MD;  Location: [facility];  Service: Gastroenterology;  Laterality: Bilateral;   LEFT HEART CATHETERIZATION WITH CORONARY ANGIOGRAM N/A 04/13/2012   Procedure: LEFT HEART CATHETERIZATION WITH CORONARY ANGIOGRAM;  Surgeon: Debby JONETTA Como, MD;  Location: [facility] CATH LAB;  Service: Cardiovascular;  Laterality: N/A;   LUMBAR LAMINECTOMY/DECOMPRESSION MICRODISCECTOMY Left 12/20/2018   Procedure: Left Lumbar Three-Four Lumbar Laminotomy and Foraminotomy, Lumbar Four-Five Laminotomy and Foraminotomy with Microdiscectomy and Resection of Synovial Cyst;  Surgeon: Alix Charleston, MD;  Location: [facility] OR;  Service: Neurosurgery;  Laterality: Left;  Left Lumbar 3-4 Lumbar laminotomy, foraminotomy, possible microdiscectomy with possible resection of synovial cyst   SPINE SURGERY     VARICOSE VEIN SURGERY     VESICOVAGINAL FISTULA CLOSURE W/ TAH      OB History     Gravida  3   Para  2   Term  2   Preterm  0   AB  1   Living  2      SAB  1   IAB      Ectopic      Multiple      Live Births              Allergies[1]  Social History   Socioeconomic History   Marital status: Divorced    Spouse name: Not on file   Number of children: Not on file   Years of education: Not on file   Highest education level: Not on file  Occupational History   Not on file  Tobacco Use   Smoking status: Former    Current packs/day: 0.00    Average packs/day: 0.3 packs/day for 10.0 years (2.5 ttl pk-yrs)    Types: Cigarettes    Start date: 01/26/2011    Quit date: 01/25/2021    Years since quitting: 3.6    Passive exposure: Past   Smokeless tobacco: Never   Tobacco comments:    smokes 2 a day   Vaping Use   Vaping status: Former   Substances: Nicotine, CBD  Substance and Sexual Activity   Alcohol use: No   Drug use: Yes    Types: Marijuana, Oxycodone     Comment: History of Cocaine use   Sexual activity: Not Currently    Birth control/protection: Surgical  Other Topics Concern   Not on file  Social History Narrative   Lives in [place] with daughter and son. She is on disability.    Social Drivers of Health   Tobacco Use: Medium Risk (09/20/2024)   Patient History    Smoking Tobacco Use: Former    Smokeless Tobacco Use: Never    Passive Exposure: Past  Programmer, Applications: Not on file  Food Insecurity: No Food Insecurity (03/20/2024)   Received from [facility]   Epic    Within the past 12 months, you worried that your food would run out before you got the money to buy more.: Never true    Within the past 12 months, the food you bought just didn't last and you didn't have money to get more.: Never true  Transportation Needs: No Transportation Needs (03/20/2024)   Received from [facility]   PRAPARE - Transportation    Lack of Transportation (Medical): No    Lack of Transportation (Non-Medical): No  Physical Activity: Not on file  Stress: Not on file  Social Connections: Unknown (01/18/2022)   Received from [facility]   Social Network    Social Network: Not on file  Depression (PHQ2-9): Not on file  Alcohol Screen: Not on file  Housing: Not on file  Utilities: Low Risk (03/20/2024)   Received from [facility]   Utilities    Within the past 12 months, have you been unable to get utilities(heat, electricity) when it was really needed?: No  Health Literacy: Not on file    Family History  Problem Relation Age of Onset   Cancer Father        Colon   Coronary artery disease Other    Heart attack Mother        d. 56   Breast cancer Paternal Uncle 70   Dementia Maternal Grandmother    Leukemia Maternal Uncle 1    Medications:       Current Medications[2]  Objective Blood pressure 100/69, pulse 73, height 5' 7 (1.702 m), weight 168 lb 3.2 oz (76.3 kg).  General WDWN female NAD Vulva:  normal appearing vulva with no masses, tenderness or lesions, atrophic Vagina:  atrophicmucosa, no discharge, no evidence of GI material, no areas of erythema Cervix:  absent Uterus: absent Adnexa: no masses   Pertinent ROS No burning with urination, frequency or urgency No nausea, vomiting or diarrhea Nor fever chills or other constitutional symptoms   Labs or studies Reviewed scan images    Impression + Management Plan: Diagnoses this Encounter::   ICD-10-CM   1. BV (bacterial vaginosis)  N76.0    B96.89     2. RVF (rectovaginal fistula): no evidence on exam or on studies  N82.3         Medications prescribed during  this encounter: Meds ordered this encounter  Medications   metroNIDAZOLE  (METROGEL ) 0.75 % vaginal gel    Sig: 1 applicator every other night for 5 applications or 10 nights    Dispense:  70 g    Refill:  0    Labs or Scans Ordered during this encounter: No orders of the defined types were placed in this encounter.     Follow up No follow-ups on file.          [1]  Allergies Allergen Reactions   Bee Venom Anaphylaxis   Cat Hair Extract Hives, Itching and Swelling    cat hair extract   Doxycycline  Nausea Only and Other (See Comments)    Due to Pre-Existing conditions involved with stomach, patient does not take the following medication  Other Reaction(s): Hives, Itching, Not available, Unknown   Dust Mite Extract Hives and Swelling   Erythromycin Nausea And Vomiting and Other (See Comments)    Due to Pre-Existing conditions involved with stomach, patient does not take the following medication  Other Reaction(s): Hives, Tongue swelling, Vomoting, Not available, Unknown  Other Reaction(s): Not available  E.E.S.   Ibuprofen  Nausea Only    gastroparesis  Other  Reaction(s): Not available  gastroparesis gastroparesis    gastroparesis   Penicillins Anaphylaxis and Swelling    Has patient had a PCN reaction causing immediate rash, facial/tongue/throat swelling, SOB or lightheadedness with hypotension: Yes  Has patient had a PCN reaction causing severe rash involving mucus membranes or skin necrosis: Yes  Has patient had a PCN reaction that required hospitalization No  Has patient had a PCN reaction occurring within the last 10 years: No  If all of the above answers are NO, then may proceed with Cephalosporin use.  Other Reaction(s): Hives, Tongue swelling, Vomiting, Unknown  Other reaction(s): Unknown Has patient had a PCN reaction causing immediate rash, facial/tongue/throat swelling, SOB or lightheadedness with hypotension: Yes Has patient had a PCN reaction  causing severe rash involving mucus membranes or skin necrosis: Yes Has patient had a PCN reaction that required hospitalization No Has patient had a PCN reaction occurring within the last 10 years: No If all of the above answers are NO, then may proceed with Cephalosporin use.    Has patient had a PCN reaction causing immediate rash, facial/tongue/throat swelling, SOB or lightheadedness with hypotension: Yes Has patient had a PCN reaction causing severe rash involving mucus membranes or skin necrosis: Yes Has patient had a PCN reaction that required hospitalization No Has patient had a PCN reaction occurring within the last 10 years: No If all of the above answers are NO, then may proceed with Cephalosporin use.   Cat Dander Hives, Itching and Swelling    SWELLING REACTION UNSPECIFIED    Morphine  Hives    Other Reaction(s): Itching, HIves, Not available   Tramadol Diarrhea, Nausea And Vomiting and Nausea Only    Other Reaction(s): Not available, Unknown  Other reaction(s): Unknown  Diarrhea   Dog Epithelium    Nsaids Other (See Comments)    Can not take d/t stomach issues  Other  Reaction(s): Not available  Non-steroidal anti-inflammatory agent (substance)   Sulfamethoxazole-Trimethoprim     Pt states she was allergic as a child, but can take now  Other Reaction(s): Not available, Unknown  sulfamethoxazole / trimethoprim   Sulfa Antibiotics Hives and Itching    Pt states she can take now as an adult  Other Reaction(s): Hives, Tongue swelling, Vomiting  [2]  Current Outpatient Medications:    albuterol  (PROVENTIL ) 2 MG tablet, Take 2 mg by mouth 3 (three) times daily., Disp: , Rfl:    albuterol  (VENTOLIN  HFA) 108 (90 Base) MCG/ACT inhaler, Inhale 2 puffs into the lungs every 6 (six) hours as needed for wheezing or shortness of breath., Disp: , Rfl:    alendronate (FOSAMAX) 70 MG tablet, Take 70 mg by mouth once a week. Take with a full glass of water on an empty stomach On Mondays, Disp: , Rfl:    ALPRAZolam  (XANAX ) 0.5 MG tablet, Take 0.5 mg by mouth 3 (three) times daily as needed for anxiety., Disp: , Rfl:    amLODipine  (NORVASC ) 10 MG tablet, Take 10 mg by mouth daily., Disp: , Rfl:    atorvastatin  (LIPITOR) 40 MG tablet, TAKE 1 TABLET (40 MG TOTAL) BY MOUTH DAILY. PLEASE KEEP UPCOMING APPOINTMENT FOR FURTHER REFILLS., Disp: 90 tablet, Rfl: 1   Azelastine -Fluticasone  137-50 MCG/ACT SUSP, INSTILL 2 SPRAYS IN EACH NOSTRIL ONCE DAILY, Disp: 23 g, Rfl: 5   buPROPion  (WELLBUTRIN  SR) 150 MG 12 hr tablet, Take 150 mg by mouth daily., Disp: , Rfl:    Calcium  Carbonate (CALCIUM  500 PO), Take 500 mg by mouth 3 (three) times daily., Disp: , Rfl:    carvedilol  (COREG ) 6.25 MG tablet, Take 1 tablet (6.25 mg total) by mouth 2 (two) times daily with a meal., Disp: 180 tablet, Rfl: 3   Cholecalciferol  (VITAMIN D -1000 MAX ST) 25 MCG (1000 UT) tablet, Take 1,000 Units by mouth daily., Disp: , Rfl:    clobetasol  ointment (TEMOVATE ) 0.05 %, APPLY TO THE AFFECTED AREA(S) TOPICALLY TWICE DAILY. DO NOT USE ON FACE. APPLY FOR 2 WEEKS MAXIMUM AT A TIME, Disp: 45 g, Rfl: 1    cyclobenzaprine  (FLEXERIL ) 10 MG tablet, Take 10 mg by mouth 3 (three) times daily as needed for muscle spasms., Disp: , Rfl:    dicyclomine  (BENTYL ) 20 MG tablet, Take 20 mg by mouth 3 (three) times  daily as needed., Disp: , Rfl:    DULoxetine (CYMBALTA) 60 MG capsule, Take 1 capsule by mouth daily., Disp: , Rfl:    EPINEPHrine  0.3 mg/0.3 mL IJ SOAJ injection, Inject 0.3 mg into the muscle as needed for anaphylaxis., Disp: , Rfl:    ferrous sulfate 325 (65 FE) MG EC tablet, Take 325 mg by mouth daily., Disp: , Rfl:    gabapentin  (NEURONTIN ) 600 MG tablet, Take 600 mg by mouth in the morning, at noon, in the evening, and at bedtime., Disp: , Rfl:    hydrocortisone  (CORTEF ) 10 MG tablet, Take 10-20 mg by mouth See admin instructions. Takes 20 mg by mouth in the morning and 10 mg at bedtime, Disp: , Rfl:    levothyroxine  (SYNTHROID ) 75 MCG tablet, Take 75 mcg by mouth daily before breakfast., Disp: , Rfl:    meclizine  (ANTIVERT ) 25 MG tablet, 1 or 2 tabs PO q8h prn dizziness, Disp: 15 tablet, Rfl: 0   metroNIDAZOLE  (METROGEL ) 0.75 % vaginal gel, 1 applicator every other night for 5 applications or 10 nights, Disp: 70 g, Rfl: 0   montelukast (SINGULAIR) 10 MG tablet, Take 10 mg by mouth at bedtime., Disp: , Rfl:    nitroGLYCERIN  (NITROSTAT ) 0.4 MG SL tablet, DISSOLVE 1 TABLET UNDER THE TONGUE EVERY 5 MINUTES AS NEEDED FOR CHEST PAIN. DO NOT EXCEED A TOTAL OF 3 DOSES IN 15 MINUTES., Disp: 30 tablet, Rfl: 11   omeprazole (PRILOSEC) 40 MG capsule, Take 40 mg by mouth every morning., Disp: , Rfl:    ondansetron  (ZOFRAN -ODT) 4 MG disintegrating tablet, 4mg  ODT q4 hours prn nausea/vomit, Disp: 10 tablet, Rfl: 0   OXYCONTIN  10 MG 12 hr tablet, Take 10 mg by mouth every 12 (twelve) hours., Disp: , Rfl:    polyethylene glycol (MIRALAX ) 17 g packet, Take 17 g by mouth daily as needed for moderate constipation., Disp: 30 packet, Rfl: 0   silver sulfADIAZINE (SILVADENE) 1 % cream, Apply topically., Disp: , Rfl:     tacrolimus  (PROTOPIC ) 0.1 % ointment, Apply topically 2 (two) times daily as needed., Disp: 100 g, Rfl: 3   topiramate  (TOPAMAX ) 200 MG tablet, Take 200 mg by mouth 2 (two) times daily., Disp: , Rfl:    triamcinolone  ointment (KENALOG ) 0.1 %, Apply 1 Application topically 2 (two) times daily as needed. DO NOT use on the face. Apply a thin layer to affected skin., Disp: 454 g, Rfl: 0   zolpidem  (AMBIEN ) 10 MG tablet, Take 10 mg by mouth at bedtime., Disp: , Rfl:    oxyCODONE  (ROXICODONE ) 15 MG immediate release tablet, Take 15 mg by mouth every 6 (six) hours as needed for pain., Disp: , Rfl:   "

## 2024-09-21 ENCOUNTER — Ambulatory Visit: Admitting: Urology

## 2024-09-25 NOTE — Pre-Procedure Instructions (Signed)
 Surgical Instructions   Your procedure is scheduled on October 01, 2024. Report to [facility] Main Entrance A at 08:30 A.M., then check in with the Admitting office. Any questions or running late day of surgery: call [phone]  Questions prior to your surgery date: call [phone], Monday-Friday, 8am-4pm. If you experience any cold or flu symptoms such as cough, fever, chills, shortness of breath, etc. between now and your scheduled surgery, please notify us  at the above number.     Remember:  Do not eat or drink after midnight the night before your surgery    Take these medicines the morning of surgery with A SIP OF WATER  amLODipine  (NORVASC )  atorvastatin  (LIPITOR)  buPROPion  (WELLBUTRIN  SR)  carvedilol  (COREG )  DULoxetine (CYMBALTA)  gabapentin  (NEURONTIN )  hydrocortisone  (CORTEF )  levothyroxine  (SYNTHROID )  omeprazole (PRILOSEC)  oxyCODONE  (ROXICODONE )  OXYCONTIN   topiramate  (TOPAMAX )   May take these medicines IF NEEDED: albuterol  (PROVENTIL )  albuterol  (VENTOLIN  HFA)  ALPRAZolam  (XANAX )  Azelastine -Fluticasone   cyclobenzaprine  (FLEXERIL )  dicyclomine  (BENTYL )  EPINEPHrine  injection if used day of surgery, call [phone].  meclizine  (ANTIVERT ) nitroGLYCERIN  (NITROSTAT ) if used day of surgery, call [phone].  ondansetron  (ZOFRAN -ODT)   One week prior to surgery, STOP taking any Aspirin  (unless otherwise instructed by your surgeon) Aleve , Naproxen , Ibuprofen , Motrin , Advil , Goody's, BC's, all herbal medications, fish oil, and non-prescription vitamins.                     Do NOT Smoke (Tobacco/Vaping) for 24 hours prior to your procedure.  If you use a CPAP at night, you may bring your mask/headgear for your overnight stay.   You will be asked to remove any contacts, glasses, piercing's, hearing aid's, dentures/partials prior to surgery. Please bring cases for these items if needed.    Your surgeon will determine if you are to be admitted or  discharged the same day.  Patients discharged the day of surgery will not be allowed to drive home, and someone needs to stay with them for 24 hours.  SURGICAL WAITING ROOM VISITATION Patients may have no more than 2 support people in the waiting area - these visitors may rotate.   Pre-op nurse will coordinate an appropriate time for 2 ADULT support persons, who may not rotate, to accompany patient in pre-op.  Children under the age of 21 must have an adult with them who is not the patient and must remain in the main waiting area with an adult.  If the patient needs to stay at the hospital during part of their recovery, the visitor guidelines for inpatient rooms apply.  Please refer to the [facility] website for the visitor guidelines for any additional information.   If you received a COVID test during your pre-op visit  it is requested that you wear a mask when out in public, stay away from anyone that may not be feeling well and notify your surgeon if you develop symptoms. If you have been in contact with anyone that has tested positive in the last 10 days please notify you surgeon.      Pre-operative 4 CHG Bathing Instructions   You can play a key role in reducing the risk of infection after surgery. Your skin needs to be as free of germs as possible. You can reduce the number of germs on your skin by washing with CHG (chlorhexidine  gluconate) soap before surgery. CHG is an antiseptic soap that kills germs and continues to kill germs even after washing.   DO  NOT use if you have an allergy  to chlorhexidine /CHG or antibacterial soaps. If your skin becomes reddened or irritated, stop using the CHG and notify one of our RNs at [phone].   Please shower with the CHG soap starting 4 days before surgery using the following schedule:     Please keep in mind the following:  DO NOT shave, including legs and underarms, starting the day of your first shower.   You may shave your face at any  point before/day of surgery.  Place clean sheets on your bed the day you start using CHG soap. Use a clean washcloth (not used since being washed) for each shower. DO NOT sleep with pets once you start using the CHG.   CHG Shower Instructions:  Wash your face and private area with normal soap. If you choose to wash your hair, wash first with your normal shampoo.  After you use shampoo/soap, rinse your hair and body thoroughly to remove shampoo/soap residue.  Turn the water OFF and apply  bottle of CHG soap to a CLEAN washcloth.  Apply CHG soap ONLY FROM YOUR NECK DOWN TO YOUR TOES (washing for 3-5 minutes)  DO NOT use CHG soap on face, private areas, open wounds, or sores.  Pay special attention to the area where your surgery is being performed.  If you are having back surgery, having someone wash your back for you may be helpful. Wait 2 minutes after CHG soap is applied, then you may rinse off the CHG soap.  Pat dry with a clean towel  Put on clean clothes/pajamas   If you choose to wear lotion, please use ONLY the CHG-compatible lotions that are listed below.  Additional instructions for the day of surgery:  If you choose, you may shower the morning of surgery with an antibacterial soap.  DO NOT APPLY any lotions, deodorants, cologne, or perfumes.   Do not bring valuables to the hospital. [facility] is not responsible for any belongings/valuables. Do not wear nail polish, gel polish, artificial nails, or any other type of covering on natural nails (fingers and toes) Do not wear jewelry or makeup Put on clean/comfortable clothes.  Please brush your teeth.  Ask your nurse before applying any prescription medications to the skin.     CHG Compatible Lotions   Aveeno Moisturizing lotion  Cetaphil Moisturizing Cream  Cetaphil Moisturizing Lotion  Clairol Herbal Essence Moisturizing Lotion, Dry Skin  Clairol Herbal Essence Moisturizing Lotion, Extra Dry Skin  Clairol Herbal  Essence Moisturizing Lotion, Normal Skin  Curel Age Defying Therapeutic Moisturizing Lotion with Alpha Hydroxy  Curel Extreme Care Body Lotion  Curel Soothing Hands Moisturizing Hand Lotion  Curel Therapeutic Moisturizing Cream, Fragrance-Free  Curel Therapeutic Moisturizing Lotion, Fragrance-Free  Curel Therapeutic Moisturizing Lotion, Original Formula  Eucerin Daily Replenishing Lotion  Eucerin Dry Skin Therapy Plus Alpha Hydroxy Crme  Eucerin Dry Skin Therapy Plus Alpha Hydroxy Lotion  Eucerin Original Crme  Eucerin Original Lotion  Eucerin Plus Crme Eucerin Plus Lotion  Eucerin TriLipid Replenishing Lotion  Keri Anti-Bacterial Hand Lotion  Keri Deep Conditioning Original Lotion Dry Skin Formula Softly Scented  Keri Deep Conditioning Original Lotion, Fragrance Free Sensitive Skin Formula  Keri Lotion Fast Absorbing Fragrance Free Sensitive Skin Formula  Keri Lotion Fast Absorbing Softly Scented Dry Skin Formula  Keri Original Lotion  Keri Skin Renewal Lotion Keri Silky Smooth Lotion  Keri Silky Smooth Sensitive Skin Lotion  Nivea Body Creamy Conditioning Oil  Nivea Body Extra Enriched Education Administrator  Original Lotion  Nivea Body Sheer Moisturizing Lotion Nivea Crme  Nivea Skin Firming Lotion  NutraDerm 30 Skin Lotion  NutraDerm Skin Lotion  NutraDerm Therapeutic Skin Cream  NutraDerm Therapeutic Skin Lotion  ProShield Protective Hand Cream  Provon moisturizing lotion  Please read over the following fact sheets that you were given.

## 2024-09-26 ENCOUNTER — Encounter (HOSPITAL_COMMUNITY)
Admission: RE | Admit: 2024-09-26 | Discharge: 2024-09-26 | Disposition: A | Discharge status: Home / Self Care | Source: Ambulatory Visit | Attending: Neurological Surgery | Admitting: Neurological Surgery

## 2024-09-26 ENCOUNTER — Encounter (HOSPITAL_COMMUNITY): Payer: Self-pay

## 2024-09-26 ENCOUNTER — Other Ambulatory Visit: Payer: Self-pay

## 2024-09-26 VITALS — BP 136/76 | HR 84 | Temp 98.7°F | Resp 18 | Ht 67.0 in | Wt 168.1 lb

## 2024-09-26 DIAGNOSIS — I2510 Atherosclerotic heart disease of native coronary artery without angina pectoris: Secondary | ICD-10-CM | POA: Insufficient documentation

## 2024-09-26 DIAGNOSIS — Z79899 Other long term (current) drug therapy: Secondary | ICD-10-CM | POA: Insufficient documentation

## 2024-09-26 DIAGNOSIS — Z8673 Personal history of transient ischemic attack (TIA), and cerebral infarction without residual deficits: Secondary | ICD-10-CM | POA: Diagnosis not present

## 2024-09-26 DIAGNOSIS — K219 Gastro-esophageal reflux disease without esophagitis: Secondary | ICD-10-CM | POA: Diagnosis not present

## 2024-09-26 DIAGNOSIS — I350 Nonrheumatic aortic (valve) stenosis: Secondary | ICD-10-CM | POA: Insufficient documentation

## 2024-09-26 DIAGNOSIS — E785 Hyperlipidemia, unspecified: Secondary | ICD-10-CM | POA: Insufficient documentation

## 2024-09-26 DIAGNOSIS — Z01818 Encounter for other preprocedural examination: Secondary | ICD-10-CM

## 2024-09-26 DIAGNOSIS — I700 Atherosclerosis of aorta: Secondary | ICD-10-CM | POA: Diagnosis not present

## 2024-09-26 DIAGNOSIS — Z79890 Hormone replacement therapy (postmenopausal): Secondary | ICD-10-CM | POA: Diagnosis not present

## 2024-09-26 DIAGNOSIS — E039 Hypothyroidism, unspecified: Secondary | ICD-10-CM | POA: Insufficient documentation

## 2024-09-26 DIAGNOSIS — E2740 Unspecified adrenocortical insufficiency: Secondary | ICD-10-CM | POA: Insufficient documentation

## 2024-09-26 DIAGNOSIS — I340 Nonrheumatic mitral (valve) insufficiency: Secondary | ICD-10-CM | POA: Diagnosis not present

## 2024-09-26 DIAGNOSIS — Z01812 Encounter for preprocedural laboratory examination: Secondary | ICD-10-CM | POA: Diagnosis present

## 2024-09-26 DIAGNOSIS — I10 Essential (primary) hypertension: Secondary | ICD-10-CM | POA: Insufficient documentation

## 2024-09-26 HISTORY — DX: Anemia, unspecified: D64.9

## 2024-09-26 LAB — SURGICAL PCR SCREEN
MRSA, PCR: NEGATIVE
Staphylococcus aureus: NEGATIVE

## 2024-09-26 LAB — CBC
HCT: 41.5 % (ref 36.0–46.0)
Hemoglobin: 13.7 g/dL (ref 12.0–15.0)
MCH: 29 pg (ref 26.0–34.0)
MCHC: 33 g/dL (ref 30.0–36.0)
MCV: 87.7 fL (ref 80.0–100.0)
Platelets: 331 K/uL (ref 150–400)
RBC: 4.73 MIL/uL (ref 3.87–5.11)
RDW: 13.9 % (ref 11.5–15.5)
WBC: 12.8 K/uL — ABNORMAL HIGH (ref 4.0–10.5)
nRBC: 0 % (ref 0.0–0.2)

## 2024-09-26 LAB — TYPE AND SCREEN
ABO/RH(D): O POS
Antibody Screen: NEGATIVE

## 2024-09-26 NOTE — Progress Notes (Signed)
 PCP - Norleen Hurst Cardiologist - Reche Finder, NP (cardiac clearance 09/18/24) Endocrinologist - Littie Caffey (saw 09/25/24, MD requested that she be called to give recommendations for steroid dosing pre-and post procedure. Called Shanda at Dr. Joshua' office and made aware.)  PPM/ICD - denies  Chest x-ray - 03/14/24 EKG - 09/18/24 Stress Test - 01/08/20 ECHO - 09/08/23 Cardiac Cath - 2013   Sleep Study - denies  No DM.  Last dose of GLP1 agonist-  denies  Blood Thinner Instructions: denies Aspirin  Instructions: denies  ERAS Protcol - NPO  COVID TEST- NA   Anesthesia review: yes, cardiac clearance received.   Patient denies shortness of breath, fever, cough and chest pain at PAT appointment. Denies any respiratory symptoms in the last two months.   All instructions explained to the patient, with a verbal understanding of the material. Patient agrees to go over the instructions while at home for a better understanding. The opportunity to ask questions was provided.

## 2024-09-27 NOTE — Progress Notes (Signed)
 Anesthesia Chart Review:  59 year old female follows with cardiology for history of HTN, HLD, nonobstructive CAD, CVA 01/2021. Cardiac CTA 03/25/22 coronary calcium  score 337, 90th percentile, overall minimal atherosclerosis (LAD, RCA less than 25% stenosis).  Echo 09/2023 showed LVEF 50 to 55%, normal RV, mild mitral regurgitation, mild aortic valve stenosis (Mean gradient only 5 mmhg with DVI 0.71 mild stenosis by Area with low LVOT diameter recorded).  Seen by Reche Finder, NP on 09/18/2024 for preop evaluation.  Per note,  According to the Revised Cardiac Risk Index (RCRI), her Perioperative Risk of Major Cardiac Event is (%): 6.6. Her Functional Capacity in METs is: 6.36 according to the Duke Activity Status Index (DASI). Per AHA/ACC guidelines, she is deemed acceptable risk for the planned procedure without additional cardiovascular testing.  Follows with endocrinologist Dr. Tommas for history of hypothyroidism and adrenal insufficiency.  She is maintained on Synthroid  o and hydrocortisone  20 mg in the morning and 10 mg in the evening.  Patient reported that Dr. Tommas requested he be called for perioperative instructions.  Surgeon's office was advised to call and speak with Dr. Tommas.  Other pertinent history includes PONV, nondiabetic gastroparesis, GERD on PPI, renal insufficiency.  BMP 09/20/2024 completed at endocrinology office reviewed, creatinine mildly elevated 1.33, otherwise unremarkable.  CBC 09/26/2024 reviewed, WBC mildly elevated 12.8, otherwise WNL.  EKG 09/18/2024: NSR.  Rate 76.  TTE 09/08/2023: 1. Septal and inferior basal hypokinesis . Left ventricular ejection  fraction, by estimation, is 50 to 55%. The left ventricle has low normal  function. The left ventricle demonstrates regional wall motion  abnormalities (see scoring diagram/findings for  description). The left ventricular internal cavity size was mildly  dilated. Left ventricular diastolic parameters were normal. The  average  left ventricular global longitudinal strain is -13.8 %. The global  longitudinal strain is abnormal.   2. Right ventricular systolic function is normal. The right ventricular  size is normal. There is normal pulmonary artery systolic pressure.   3. Left atrial size was mildly dilated.   4. The mitral valve is abnormal. Mild mitral valve regurgitation. No  evidence of mitral stenosis.   5. Mean gradient only 5 mmhg with DVI 0.71 mild stenosis by Area with low  LVOT diameter recorded. The aortic valve was not well visualized. There is  moderate calcification of the aortic valve. There is moderate thickening  of the aortic valve. Aortic  valve regurgitation is not visualized. Mild aortic valve stenosis.   6. The inferior vena cava is normal in size with greater than 50%  respiratory variability, suggesting right atrial pressure of 3 mmHg.   Coronary CTA 03/25/2022: IMPRESSION: 1. Coronary calcium  score of 337. This was 98th percentile for age-, sex, and race-matched controls.   2.  Normal coronary origin with right dominance.   3.  Minimal atherosclerosis.  CAD RADS 1.   4.  Consider non atherosclerotic causes of chest pain.   5.  Recommend preventive therapy and risk factor modification.    Lynwood Geofm RIGGERS [facility] Short Stay Center/Anesthesiology Phone [phone] 09/27/2024 11:45 AM

## 2024-09-27 NOTE — Anesthesia Preprocedure Evaluation (Addendum)
 "                                  Anesthesia Evaluation    History of Anesthesia Complications (+) PONV and history of anesthetic complications  Airway        Dental   Pulmonary former smoker          Cardiovascular hypertension, Pt. on medications and Pt. on home beta blockers + CAD and +CHF    EKG NSR, Normal  Echo 09/08/23 1. Septal and inferior basal hypokinesis . Left ventricular ejection  fraction, by estimation, is 50 to 55%. The left ventricle has low normal  function. The left ventricle demonstrates regional wall motion  abnormalities (see scoring diagram/findings for  description). The left ventricular internal cavity size was mildly  dilated. Left ventricular diastolic parameters were normal. The average  left ventricular global longitudinal strain is -13.8 %. The global  longitudinal strain is abnormal.   2. Right ventricular systolic function is normal. The right ventricular  size is normal. There is normal pulmonary artery systolic pressure.   3. Left atrial size was mildly dilated.   4. The mitral valve is abnormal. Mild mitral valve regurgitation. No  evidence of mitral stenosis.   5. Mean gradient only 5 mmhg with DVI 0.71 mild stenosis by Area with low  LVOT diameter recorded. The aortic valve was not well visualized. There is  moderate calcification of the aortic valve. There is moderate thickening  of the aortic valve. Aortic  valve regurgitation is not visualized. Mild aortic valve stenosis.   6. The inferior vena cava is normal in size with greater than 50%  respiratory variability, suggesting right atrial pressure of 3 mmHg.      Neuro/Psych  Headaches PSYCHIATRIC DISORDERS Anxiety      Neuromuscular disease CVA, Residual Symptoms    GI/Hepatic Neg liver ROS,GERD  Medicated,,Gastroparesis   Endo/Other  Hypothyroidism  Addison's disease Pre diabetes HLD   Renal/GU Renal InsufficiencyRenal diseaseLab Results      Component                 Value               Date                      NA                       142                 07/19/2024                CL                       106                 07/19/2024                K                        3.6                 07/19/2024                CO2  25                  07/19/2024                BUN                      11                  07/19/2024                CREATININE               1.20 (H)            07/19/2024                GFRNONAA                 52 (L)              07/19/2024                CALCIUM                   9.2                 07/19/2024                PHOS                     4.0                 08/07/2019                ALBUMIN                   4.4                 07/19/2024                GLUCOSE                  97                  07/19/2024             negative genitourinary   Musculoskeletal  (+) Arthritis , Osteoarthritis,  Osteoporosis Cervical radiculopathy   Abdominal   Peds  Hematology  (+) Blood dyscrasia, anemia Lab Results      Component                Value               Date                      WBC                      12.8 (H)            09/26/2024                HGB                      13.7                09/26/2024                HCT  41.5                09/26/2024                MCV                      87.7                09/26/2024                PLT                      331                 09/26/2024              Anesthesia Other Findings   Reproductive/Obstetrics                              Anesthesia Physical Anesthesia Plan  ASA: 3  Anesthesia Plan: General   Post-op Pain Management: Dilaudid  IV, Ofirmev  IV (intra-op)* and Minimal or no pain anticipated   Induction: Intravenous  PONV Risk Score and Plan: 4 or greater and Midazolam , Propofol  infusion, TIVA, Treatment may vary due to age or medical condition, Ondansetron  and  Dexamethasone   Airway Management Planned: Oral ETT  Additional Equipment: None  Intra-op Plan:   Post-operative Plan: Extubation in OR  Informed Consent:   Plan Discussed with:   Anesthesia Plan Comments: (PAT note by Lynwood Hope, PA-C: 59 year old female follows with cardiology for history of HTN, HLD, nonobstructive CAD, CVA 01/2021. Cardiac CTA 03/25/22 coronary calcium  score 337, 90th percentile, overall minimal atherosclerosis (LAD, RCA less than 25% stenosis).  Echo 09/2023 showed LVEF 50 to 55%, normal RV, mild mitral regurgitation, mild aortic valve stenosis (Mean gradient only 5 mmhg with DVI 0.71 mild stenosis by Area with low LVOT diameter recorded).  Seen by Reche Finder, NP on 09/18/2024 for preop evaluation.  Per note,  According to the Revised Cardiac Risk Index (RCRI), her Perioperative Risk of Major Cardiac Event is (%): 6.6. Her Functional Capacity in METs is: 6.36 according to the Duke Activity Status Index (DASI). Per AHA/ACC guidelines, she is deemed acceptable risk for the planned procedure without additional cardiovascular testing.  Follows with endocrinologist Dr. Tommas for history of hypothyroidism and adrenal insufficiency.  She is maintained on Synthroid  o and hydrocortisone  20 mg in the morning and 10 mg in the evening.  Patient reported that Dr. Tommas requested he be called for perioperative instructions.  Surgeon's office was advised to call and speak with Dr. Tommas.  Other pertinent history includes PONV, nondiabetic gastroparesis, GERD on PPI, renal insufficiency.  BMP 09/20/2024 completed at endocrinology office reviewed, creatinine mildly elevated 1.33, otherwise unremarkable.  CBC 09/26/2024 reviewed, WBC mildly elevated 12.8, otherwise WNL.  Addendum 09/28/2024: Received risk stratification from endocrinologist Dr. Tommas dated 09/27/2024 which states that she is high risk.  Perioperative stress dosing of steroids is also outlined: Inject hydrocortisone  50 mg  IV every 8 hours until patient is able to take po.  Start 30 minutes preop.  Copy scanned in media.  EKG 09/18/2024: NSR.  Rate 76.  TTE 09/08/2023: 1. Septal and inferior basal hypokinesis . Left ventricular ejection  fraction, by estimation, is 50 to 55%. The left ventricle has low normal  function. The left ventricle demonstrates regional wall motion  abnormalities (see scoring diagram/findings for  description). The left ventricular internal cavity size was mildly  dilated. Left ventricular diastolic parameters were normal. The average  left ventricular global longitudinal strain is -13.8 %. The global  longitudinal strain is abnormal.   2. Right ventricular systolic function is normal. The right ventricular  size is normal. There is normal pulmonary artery systolic pressure.   3. Left atrial size was mildly dilated.   4. The mitral valve is abnormal. Mild mitral valve regurgitation. No  evidence of mitral stenosis.   5. Mean gradient only 5 mmhg with DVI 0.71 mild stenosis by Area with low  LVOT diameter recorded. The aortic valve was not well visualized. There is  moderate calcification of the aortic valve. There is moderate thickening  of the aortic valve. Aortic  valve regurgitation is not visualized. Mild aortic valve stenosis.   6. The inferior vena cava is normal in size with greater than 50%  respiratory variability, suggesting right atrial pressure of 3 mmHg.   Coronary CTA 03/25/2022: IMPRESSION: 1. Coronary calcium  score of 337. This was 98th percentile for age-, sex, and race-matched controls.   2.  Normal coronary origin with right dominance.   3.  Minimal atherosclerosis.  CAD RADS 1.   4.  Consider non atherosclerotic causes of chest pain.   5.  Recommend preventive therapy and risk factor modification.  )         Anesthesia Quick Evaluation  "

## 2024-09-30 ENCOUNTER — Encounter (HOSPITAL_COMMUNITY): Payer: Self-pay | Admitting: Neurological Surgery

## 2024-10-01 ENCOUNTER — Encounter (HOSPITAL_COMMUNITY): Payer: Self-pay | Admitting: Physician Assistant

## 2024-10-01 ENCOUNTER — Encounter (HOSPITAL_COMMUNITY): Admission: RE | Payer: Self-pay | Source: Home / Self Care

## 2024-10-01 ENCOUNTER — Inpatient Hospital Stay (HOSPITAL_COMMUNITY): Admission: RE | Admit: 2024-10-01 | Source: Home / Self Care | Admitting: Neurological Surgery

## 2024-10-01 ENCOUNTER — Encounter (HOSPITAL_COMMUNITY): Payer: Self-pay | Admitting: Anesthesiology

## 2024-10-10 NOTE — Progress Notes (Signed)
 Updated with new surgery date/time of 10/12/2024 at 1120, NPO. No further questions at this time.

## 2024-10-11 NOTE — Progress Notes (Signed)
 Left a VM informing patient of time change for surgery to 1304. Also left instructions to arrive by 1100 and to call with any questions.

## 2024-10-12 ENCOUNTER — Inpatient Hospital Stay (HOSPITAL_COMMUNITY): Admitting: Anesthesiology

## 2024-10-12 ENCOUNTER — Inpatient Hospital Stay (HOSPITAL_COMMUNITY)

## 2024-10-12 ENCOUNTER — Encounter (HOSPITAL_COMMUNITY): Admission: RE | Payer: Self-pay | Source: Home / Self Care

## 2024-10-12 ENCOUNTER — Inpatient Hospital Stay (HOSPITAL_COMMUNITY): Admission: RE | Admit: 2024-10-12 | Source: Home / Self Care | Admitting: Neurological Surgery

## 2024-10-12 ENCOUNTER — Encounter (HOSPITAL_COMMUNITY): Payer: Self-pay | Admitting: Neurological Surgery

## 2024-10-12 DIAGNOSIS — Z981 Arthrodesis status: Principal | ICD-10-CM

## 2024-10-12 MED ORDER — VANCOMYCIN HCL IN DEXTROSE 1-5 GM/200ML-% IV SOLN
INTRAVENOUS | Status: AC
  Administered 2024-10-12: 1000 mg via INTRAVENOUS
  Filled 2024-10-12: qty 200

## 2024-10-12 MED ORDER — CYCLOBENZAPRINE HCL 10 MG PO TABS: 10.0000 mg | ORAL_TABLET | Freq: Three times a day (TID) | ORAL | Status: AC | PRN

## 2024-10-12 MED ORDER — OXYCODONE HCL 5 MG PO TABS: 5.0000 mg | ORAL_TABLET | Freq: Once | ORAL | Status: AC | PRN

## 2024-10-12 MED ORDER — HYDROMORPHONE HCL 1 MG/ML IJ SOLN
INTRAMUSCULAR | Status: AC
  Filled 2024-10-12: qty 1

## 2024-10-12 MED ORDER — HYDROCORTISONE 10 MG PO TABS: 10.0000 mg | ORAL_TABLET | ORAL | Status: DC

## 2024-10-12 MED ORDER — AMISULPRIDE (ANTIEMETIC) 5 MG/2ML IV SOLN: 10.0000 mg | Freq: Once | INTRAVENOUS | Status: DC | PRN

## 2024-10-12 MED ORDER — LIDOCAINE 2% (20 MG/ML) 5 ML SYRINGE
INTRAMUSCULAR | Status: DC | PRN
  Administered 2024-10-12: 100 mg via INTRAVENOUS

## 2024-10-12 MED ORDER — DEXAMETHASONE SODIUM PHOSPHATE 4 MG/ML IJ SOLN: 4.0000 mg | Freq: Four times a day (QID) | INTRAMUSCULAR | Status: AC

## 2024-10-12 MED ORDER — MONTELUKAST SODIUM 10 MG PO TABS
10.0000 mg | ORAL_TABLET | Freq: Every day | ORAL | Status: AC
  Administered 2024-10-12: 10 mg via ORAL
  Filled 2024-10-12: qty 1

## 2024-10-12 MED ORDER — VANCOMYCIN HCL IN DEXTROSE 1-5 GM/200ML-% IV SOLN: 1000.0000 mg | Freq: Once | INTRAVENOUS | Status: AC

## 2024-10-12 MED ORDER — PANTOPRAZOLE SODIUM 40 MG PO TBEC
40.0000 mg | DELAYED_RELEASE_TABLET | Freq: Every day | ORAL | Status: AC
  Administered 2024-10-12: 40 mg via ORAL
  Filled 2024-10-12: qty 1

## 2024-10-12 MED ORDER — ACETAMINOPHEN 10 MG/ML IV SOLN
INTRAVENOUS | Status: AC
  Filled 2024-10-12: qty 100

## 2024-10-12 MED ORDER — 0.9 % SODIUM CHLORIDE (POUR BTL) OPTIME
TOPICAL | Status: DC | PRN
  Administered 2024-10-12: 1000 mL

## 2024-10-12 MED ORDER — PROPOFOL 10 MG/ML IV BOLUS
INTRAVENOUS | Status: DC | PRN
  Administered 2024-10-12: 50 mg via INTRAVENOUS
  Administered 2024-10-12: 30 mg via INTRAVENOUS

## 2024-10-12 MED ORDER — LEVOTHYROXINE SODIUM 75 MCG PO TABS: 75.0000 ug | ORAL_TABLET | Freq: Every day | ORAL | Status: AC

## 2024-10-12 MED ORDER — EPHEDRINE SULFATE-NACL 50-0.9 MG/10ML-% IV SOSY
PREFILLED_SYRINGE | INTRAVENOUS | Status: DC | PRN
  Administered 2024-10-12: 5 mg via INTRAVENOUS

## 2024-10-12 MED ORDER — HYDROMORPHONE HCL 1 MG/ML IJ SOLN
INTRAMUSCULAR | Status: AC
  Filled 2024-10-12: qty 0.5

## 2024-10-12 MED ORDER — HYDROMORPHONE HCL 1 MG/ML IJ SOLN: 0.5000 mg | INTRAMUSCULAR | Status: AC | PRN

## 2024-10-12 MED ORDER — ACETAMINOPHEN 500 MG PO TABS
ORAL_TABLET | ORAL | Status: AC
  Administered 2024-10-12: 1000 mg via ORAL
  Filled 2024-10-12: qty 2

## 2024-10-12 MED ORDER — ONDANSETRON HCL 4 MG/2ML IJ SOLN
INTRAMUSCULAR | Status: DC | PRN
  Administered 2024-10-12: 4 mg via INTRAVENOUS

## 2024-10-12 MED ORDER — PHENYLEPHRINE 80 MCG/ML (10ML) SYRINGE FOR IV PUSH (FOR BLOOD PRESSURE SUPPORT)
PREFILLED_SYRINGE | INTRAVENOUS | Status: DC | PRN
  Administered 2024-10-12: 80 ug via INTRAVENOUS
  Administered 2024-10-12: 40 ug via INTRAVENOUS
  Administered 2024-10-12: 160 ug via INTRAVENOUS

## 2024-10-12 MED ORDER — GABAPENTIN 300 MG PO CAPS
600.0000 mg | ORAL_CAPSULE | Freq: Three times a day (TID) | ORAL | Status: AC
  Administered 2024-10-12: 600 mg via ORAL
  Filled 2024-10-12: qty 2

## 2024-10-12 MED ORDER — ROCURONIUM BROMIDE 10 MG/ML (PF) SYRINGE
PREFILLED_SYRINGE | INTRAVENOUS | Status: DC | PRN
  Administered 2024-10-12: 50 mg via INTRAVENOUS

## 2024-10-12 MED ORDER — LACTATED RINGERS IV SOLN: INTRAVENOUS | Status: DC | PRN

## 2024-10-12 MED ORDER — SODIUM CHLORIDE 0.9% FLUSH: 3.0000 mL | Freq: Two times a day (BID) | INTRAVENOUS | Status: AC

## 2024-10-12 MED ORDER — PROPOFOL 10 MG/ML IV BOLUS
INTRAVENOUS | Status: AC
  Filled 2024-10-12: qty 20

## 2024-10-12 MED ORDER — DICYCLOMINE HCL 20 MG PO TABS: 20.0000 mg | ORAL_TABLET | Freq: Three times a day (TID) | ORAL | Status: AC | PRN

## 2024-10-12 MED ORDER — CARVEDILOL 6.25 MG PO TABS: 6.2500 mg | ORAL_TABLET | Freq: Two times a day (BID) | ORAL | Status: AC

## 2024-10-12 MED ORDER — THROMBIN (RECOMBINANT) 5000 UNITS EX SOLR
CUTANEOUS | Status: DC | PRN
  Administered 2024-10-12: 10 mL via TOPICAL

## 2024-10-12 MED ORDER — LACTATED RINGERS IV SOLN: INTRAVENOUS | Status: DC

## 2024-10-12 MED ORDER — ALBUTEROL SULFATE (2.5 MG/3ML) 0.083% IN NEBU: 2.5000 mg | INHALATION_SOLUTION | Freq: Four times a day (QID) | RESPIRATORY_TRACT | Status: AC | PRN

## 2024-10-12 MED ORDER — GABAPENTIN 300 MG PO CAPS: 300.0000 mg | ORAL_CAPSULE | Freq: Once | ORAL | Status: AC

## 2024-10-12 MED ORDER — SENNA 8.6 MG PO TABS
1.0000 | ORAL_TABLET | Freq: Two times a day (BID) | ORAL | Status: AC
  Administered 2024-10-12: 8.6 mg via ORAL
  Filled 2024-10-12: qty 1

## 2024-10-12 MED ORDER — VANCOMYCIN HCL 1000 MG IV SOLR
INTRAVENOUS | Status: AC
  Filled 2024-10-12: qty 20

## 2024-10-12 MED ORDER — SODIUM CHLORIDE 0.9% FLUSH: 3.0000 mL | INTRAVENOUS | Status: AC | PRN

## 2024-10-12 MED ORDER — ACETAMINOPHEN 10 MG/ML IV SOLN: 1000.0000 mg | Freq: Once | INTRAVENOUS | Status: DC | PRN

## 2024-10-12 MED ORDER — SODIUM CHLORIDE 0.9 % IV SOLN: 250.0000 mL | INTRAVENOUS | Status: AC

## 2024-10-12 MED ORDER — HYDROMORPHONE HCL 1 MG/ML IJ SOLN
0.2500 mg | INTRAMUSCULAR | Status: DC | PRN
  Administered 2024-10-12: 0.25 mg via INTRAVENOUS
  Administered 2024-10-12 (×2): 0.5 mg via INTRAVENOUS

## 2024-10-12 MED ORDER — POTASSIUM CHLORIDE IN NACL 20-0.9 MEQ/L-% IV SOLN
INTRAVENOUS | Status: AC
  Filled 2024-10-12: qty 1000

## 2024-10-12 MED ORDER — ORAL CARE MOUTH RINSE: 15.0000 mL | Freq: Once | OROMUCOSAL | Status: AC

## 2024-10-12 MED ORDER — POLYETHYLENE GLYCOL 3350 17 G PO PACK: 17.0000 g | PACK | Freq: Every day | ORAL | Status: AC | PRN

## 2024-10-12 MED ORDER — SUGAMMADEX SODIUM 200 MG/2ML IV SOLN
INTRAVENOUS | Status: DC | PRN
  Administered 2024-10-12: 200 mg via INTRAVENOUS

## 2024-10-12 MED ORDER — HYDROMORPHONE HCL 1 MG/ML IJ SOLN
INTRAMUSCULAR | Status: DC | PRN
  Administered 2024-10-12: .5 mg via INTRAVENOUS

## 2024-10-12 MED ORDER — BACITRACIN ZINC 500 UNIT/GM EX OINT
TOPICAL_OINTMENT | CUTANEOUS | Status: AC
  Filled 2024-10-12: qty 28.35

## 2024-10-12 MED ORDER — ACETAMINOPHEN 500 MG PO TABS: 1000.0000 mg | ORAL_TABLET | Freq: Once | ORAL | Status: AC

## 2024-10-12 MED ORDER — HYDROCORTISONE 20 MG PO TABS
20.0000 mg | ORAL_TABLET | Freq: Every day | ORAL | Status: DC
  Filled 2024-10-12: qty 1

## 2024-10-12 MED ORDER — BUPROPION HCL ER (SR) 150 MG PO TB12: 150.0000 mg | ORAL_TABLET | Freq: Every day | ORAL | Status: AC

## 2024-10-12 MED ORDER — DULOXETINE HCL 60 MG PO CPEP: 60.0000 mg | ORAL_CAPSULE | Freq: Every day | ORAL | Status: AC

## 2024-10-12 MED ORDER — PHENYLEPHRINE HCL-NACL 20-0.9 MG/250ML-% IV SOLN
INTRAVENOUS | Status: DC | PRN
  Administered 2024-10-12: 40 ug/min via INTRAVENOUS

## 2024-10-12 MED ORDER — OXYCODONE HCL 5 MG PO TABS
15.0000 mg | ORAL_TABLET | Freq: Four times a day (QID) | ORAL | Status: AC
  Administered 2024-10-12: 15 mg via ORAL
  Filled 2024-10-12: qty 3

## 2024-10-12 MED ORDER — TOPIRAMATE 100 MG PO TABS
200.0000 mg | ORAL_TABLET | Freq: Two times a day (BID) | ORAL | Status: AC
  Administered 2024-10-12: 200 mg via ORAL
  Filled 2024-10-12 (×2): qty 2

## 2024-10-12 MED ORDER — AMLODIPINE BESYLATE 10 MG PO TABS: 10.0000 mg | ORAL_TABLET | Freq: Every day | ORAL | Status: AC

## 2024-10-12 MED ORDER — FERROUS SULFATE 325 (65 FE) MG PO TABS
325.0000 mg | ORAL_TABLET | Freq: Every day | ORAL | Status: AC
  Administered 2024-10-12: 325 mg via ORAL
  Filled 2024-10-12: qty 1

## 2024-10-12 MED ORDER — DEXAMETHASONE 4 MG PO TABS
4.0000 mg | ORAL_TABLET | Freq: Four times a day (QID) | ORAL | Status: AC
  Administered 2024-10-12: 4 mg via ORAL
  Filled 2024-10-12: qty 1

## 2024-10-12 MED ORDER — HYDROCORTISONE SOD SUC (PF) 100 MG IJ SOLR
50.0000 mg | Freq: Three times a day (TID) | INTRAMUSCULAR | Status: DC
  Administered 2024-10-12: 50 mg via INTRAVENOUS

## 2024-10-12 MED ORDER — THROMBIN 5000 UNITS EX KIT
PACK | CUTANEOUS | Status: AC
  Filled 2024-10-12: qty 3

## 2024-10-12 MED ORDER — FENTANYL CITRATE (PF) 100 MCG/2ML IJ SOLN
INTRAMUSCULAR | Status: AC
  Filled 2024-10-12: qty 2

## 2024-10-12 MED ORDER — BUPIVACAINE HCL (PF) 0.25 % IJ SOLN
INTRAMUSCULAR | Status: DC | PRN
  Administered 2024-10-12: 7 mL

## 2024-10-12 MED ORDER — VITAMIN D3 25 MCG (1000 UNIT) PO TABS
1000.0000 [IU] | ORAL_TABLET | Freq: Every day | ORAL | Status: AC
  Administered 2024-10-12: 1000 [IU] via ORAL
  Filled 2024-10-12 (×2): qty 1

## 2024-10-12 MED ORDER — CHLORHEXIDINE GLUCONATE 0.12 % MT SOLN
15.0000 mL | Freq: Once | OROMUCOSAL | Status: AC
  Administered 2024-10-12: 15 mL via OROMUCOSAL
  Filled 2024-10-12: qty 15

## 2024-10-12 MED ORDER — THROMBIN 5000 UNITS EX SOLR
OROMUCOSAL | Status: DC | PRN
  Administered 2024-10-12: 10 mL via TOPICAL

## 2024-10-12 MED ORDER — HYDROCORTISONE 10 MG PO TABS
10.0000 mg | ORAL_TABLET | Freq: Every day | ORAL | Status: AC
  Administered 2024-10-12: 10 mg via ORAL
  Filled 2024-10-12: qty 1

## 2024-10-12 MED ORDER — OXYCODONE HCL 5 MG/5ML PO SOLN
ORAL | Status: AC
  Filled 2024-10-12: qty 5

## 2024-10-12 MED ORDER — FENTANYL CITRATE (PF) 250 MCG/5ML IJ SOLN
INTRAMUSCULAR | Status: DC | PRN
  Administered 2024-10-12 (×2): 50 ug via INTRAVENOUS

## 2024-10-12 MED ORDER — OXYCODONE HCL 5 MG/5ML PO SOLN
5.0000 mg | Freq: Once | ORAL | Status: AC | PRN
  Administered 2024-10-12: 5 mg via ORAL

## 2024-10-12 MED ORDER — HYDROCORTISONE 20 MG PO TABS: 20.0000 mg | ORAL_TABLET | Freq: Every day | ORAL | Status: AC

## 2024-10-12 MED ORDER — ACETAMINOPHEN 500 MG PO TABS
1000.0000 mg | ORAL_TABLET | Freq: Four times a day (QID) | ORAL | Status: AC
  Administered 2024-10-12: 1000 mg via ORAL
  Filled 2024-10-12: qty 2

## 2024-10-12 MED ORDER — BUPIVACAINE HCL (PF) 0.25 % IJ SOLN
INTRAMUSCULAR | Status: AC
  Filled 2024-10-12: qty 30

## 2024-10-12 MED ORDER — ALPRAZOLAM 0.5 MG PO TABS
0.5000 mg | ORAL_TABLET | Freq: Three times a day (TID) | ORAL | Status: AC
  Administered 2024-10-12: 0.5 mg via ORAL
  Filled 2024-10-12: qty 1

## 2024-10-12 MED ORDER — GABAPENTIN 300 MG PO CAPS
ORAL_CAPSULE | ORAL | Status: AC
  Administered 2024-10-12: 300 mg via ORAL
  Filled 2024-10-12: qty 1

## 2024-10-12 NOTE — H&P (Signed)
 Subjective:   Patient is a 59 y.o. female admitted for neck and R arm pain with C8 radiculopathy and hand weakness. The patient first presented to me with complaints of shooting pains in the arm(s), numbness of the arm(s), and loss of strength of the arm(s). Onset of symptoms was several months ago. The pain is described as aching and occurs all day. The pain is rated severe, and is located in the neck and radiates to the RUE. The symptoms have been progressive. Symptoms are exacerbated by extending head backwards, and are relieved by none.  Previous work up includes MRI of cervical spine, results: spinal stenosis.  Past Medical History:  Diagnosis Date   Addison's disease (HCC)    Addison's disease (HCC)    Adrenal insufficiency    diagnosed 2012   Anemia    Aneurysm    right said of brain   Anxiety    Arthritis    Astigmatism    CAD (coronary artery disease)    Cath 2008 EF normal. RCA 50-60, Septal 50%. Myoview  3/12: EF 53% normal perfusion   Cardiac arrest (HCC)    2/2 adissonian crisis   Cardiomyopathy    resolved   Chest pain    chronicc   CHF (congestive heart failure) (HCC)    Chronic back pain    Chronic diarrhea    Concussion    sept 28th 2014   Family history of breast cancer    Family history of colon cancer    Gastroparesis    GERD (gastroesophageal reflux disease)    Headache    migraines   HTN (hypertension)    Hyperlipidemia    Hypothyroidism    Mitral valve prolapse    Nondiabetic gastroparesis    PONV (postoperative nausea and vomiting)    Pre-diabetes    QT prolongation    Renal impairment 07/02/2024   Stroke [facility])    possible stroke 01/2021   Tobacco abuse    down to 2 cigarettes per day   Vertigo     Past Surgical History:  Procedure Laterality Date   ABDOMINAL HYSTERECTOMY     BIOPSY  12/22/2022   Procedure: BIOPSY;  Surgeon: Burnette Fallow, MD;  Location: [facility];  Service: Gastroenterology;;   CARDIAC CATHETERIZATION  03/07/2007    showed 60% lesion in the right coronary artery   CHOLECYSTECTOMY     COLONOSCOPY     ESOPHAGOGASTRODUODENOSCOPY (EGD) WITH PROPOFOL  Bilateral 12/22/2022   Procedure: ESOPHAGOGASTRODUODENOSCOPY (EGD) WITH PROPOFOL ;  Surgeon: Burnette Fallow, MD;  Location: [facility];  Service: Gastroenterology;  Laterality: Bilateral;  forward scope and side view   EUS Bilateral 12/22/2022   Procedure: UPPER ENDOSCOPIC ULTRASOUND (EUS) LINEAR;  Surgeon: Burnette Fallow, MD;  Location: [facility];  Service: Gastroenterology;  Laterality: Bilateral;   LEFT HEART CATHETERIZATION WITH CORONARY ANGIOGRAM N/A 04/13/2012   Procedure: LEFT HEART CATHETERIZATION WITH CORONARY ANGIOGRAM;  Surgeon: Debby JONETTA Como, MD;  Location: [facility] CATH LAB;  Service: Cardiovascular;  Laterality: N/A;   LUMBAR LAMINECTOMY/DECOMPRESSION MICRODISCECTOMY Left 12/20/2018   Procedure: Left Lumbar Three-Four Lumbar Laminotomy and Foraminotomy, Lumbar Four-Five Laminotomy and Foraminotomy with Microdiscectomy and Resection of Synovial Cyst;  Surgeon: Alix Charleston, MD;  Location: [facility] OR;  Service: Neurosurgery;  Laterality: Left;  Left Lumbar 3-4 Lumbar laminotomy, foraminotomy, possible microdiscectomy with possible resection of synovial cyst   NECK SURGERY  2001   SPINE SURGERY  2022   VARICOSE VEIN SURGERY     VESICOVAGINAL FISTULA CLOSURE W/ TAH  Allergies[1]  Social History   Tobacco Use   Smoking status: Former    Current packs/day: 0.00    Average packs/day: 0.3 packs/day for 10.0 years (2.5 ttl pk-yrs)    Types: Cigarettes    Start date: 01/26/2011    Quit date: 01/25/2021    Years since quitting: 3.7    Passive exposure: Past   Smokeless tobacco: Never   Tobacco comments:    smokes 2 a day  Substance Use Topics   Alcohol use: No    Family History  Problem Relation Age of Onset   Cancer Father        Colon   Coronary artery disease Other    Heart attack Mother        d. 4   Breast cancer Paternal Uncle 41    Dementia Maternal Grandmother    Leukemia Maternal Uncle 1   Prior to Admission medications  Medication Sig Start Date End Date Taking? Authorizing Provider  albuterol  (PROVENTIL ) 2 MG tablet Take 2 mg by mouth 3 (three) times daily as needed for wheezing or shortness of breath. 09/28/23   [provider]  albuterol  (VENTOLIN  HFA) 108 (90 Base) MCG/ACT inhaler Inhale 2 puffs into the lungs every 6 (six) hours as needed for wheezing or shortness of breath.    [provider]  alendronate (FOSAMAX) 70 MG tablet Take 70 mg by mouth every Monday. Take with a full glass of water on an empty stomach On Mondays    [provider]  ALPRAZolam  (XANAX ) 0.5 MG tablet Take 0.5 mg by mouth 3 (three) times daily.    [provider]  amLODipine  (NORVASC ) 10 MG tablet Take 10 mg by mouth daily.    [provider]  atorvastatin  (LIPITOR) 40 MG tablet TAKE 1 TABLET (40 MG TOTAL) BY MOUTH DAILY. PLEASE KEEP UPCOMING APPOINTMENT FOR FURTHER REFILLS. 10/24/23   Lonni Slain, MD  Azelastine -Fluticasone  137-50 MCG/ACT SUSP INSTILL 2 SPRAYS IN EACH NOSTRIL ONCE DAILY Patient taking differently: Place 2 sprays into both nostrils 2 (two) times daily as needed (allergies). 08/15/24   Iva Marty Saltness, MD  buPROPion  (WELLBUTRIN  SR) 150 MG 12 hr tablet Take 150 mg by mouth daily. 04/14/22   [provider]  Calcium  Carbonate (CALCIUM  500 PO) Take 500 mg by mouth 3 (three) times daily.    [provider]  carvedilol  (COREG ) 6.25 MG tablet Take 1 tablet (6.25 mg total) by mouth 2 (two) times daily with a meal. 04/12/24   Lonni Slain, MD  Cholecalciferol  (VITAMIN D -1000 MAX ST) 25 MCG (1000 UT) tablet Take 1,000 Units by mouth daily.    [provider]  clobetasol  ointment (TEMOVATE ) 0.05 % APPLY TO THE AFFECTED AREA(S) TOPICALLY TWICE DAILY. DO NOT USE ON FACE. APPLY FOR 2 WEEKS MAXIMUM AT A TIME 06/25/24   Iva Marty Saltness, MD   cyclobenzaprine  (FLEXERIL ) 10 MG tablet Take 10 mg by mouth 3 (three) times daily as needed for muscle spasms. 03/31/21   [provider]  dicyclomine  (BENTYL ) 20 MG tablet Take 20 mg by mouth 3 (three) times daily as needed for spasms. 08/09/23   [provider]  DULoxetine  (CYMBALTA ) 60 MG capsule Take 60 mg by mouth daily.    [provider]  EPINEPHrine  0.3 mg/0.3 mL IJ SOAJ injection Inject 0.3 mg into the muscle as needed for anaphylaxis.    [provider]  ferrous sulfate  325 (65 FE) MG EC tablet Take 325 mg by mouth daily.  [provider]  gabapentin  (NEURONTIN ) 600 MG tablet Take 600 mg by mouth in the morning, at noon, in the evening, and at bedtime. 07/25/19   [provider]  hydrocortisone  (CORTEF ) 10 MG tablet Take 10-20 mg by mouth See admin instructions. Takes 20 mg by mouth in the morning and 10 mg at bedtime 04/07/16   [provider]  levothyroxine  (SYNTHROID ) 75 MCG tablet Take 75 mcg by mouth daily before breakfast.    [provider]  meclizine  (ANTIVERT ) 25 MG tablet 1 or 2 tabs PO q8h prn dizziness 04/16/16   Joyice Sauer, DO  metroNIDAZOLE  (METROGEL ) 0.75 % vaginal gel 1 applicator every other night for 5 applications or 10 nights 09/20/24   Jayne Vonn DEL, MD  montelukast  (SINGULAIR ) 10 MG tablet Take 10 mg by mouth at bedtime.    [provider]  nitroGLYCERIN  (NITROSTAT ) 0.4 MG SL tablet DISSOLVE 1 TABLET UNDER THE TONGUE EVERY 5 MINUTES AS NEEDED FOR CHEST PAIN. DO NOT EXCEED A TOTAL OF 3 DOSES IN 15 MINUTES. 06/06/24   Lonni Slain, MD  omeprazole (PRILOSEC) 40 MG capsule Take 40 mg by mouth every morning.    [provider]  ondansetron  (ZOFRAN -ODT) 4 MG disintegrating tablet 4mg  ODT q4 hours prn nausea/vomit 07/19/24   Zammit, Joseph, MD  oxyCODONE  (ROXICODONE ) 15 MG immediate release tablet Take 15 mg by mouth every 6 (six) hours.    [provider]   OXYCONTIN  10 MG 12 hr tablet Take 10 mg by mouth every 12 (twelve) hours.    [provider]  polyethylene glycol (MIRALAX ) 17 g packet Take 17 g by mouth daily as needed for moderate constipation. 06/14/24   Haviland, Julie, MD  silver sulfADIAZINE (SILVADENE) 1 % cream Apply 1 Application topically daily as needed (irritation).    [provider]  tacrolimus  (PROTOPIC ) 0.1 % ointment Apply topically 2 (two) times daily as needed. 02/17/24   Iva Marty Saltness, MD  topiramate  (TOPAMAX ) 200 MG tablet Take 200 mg by mouth 2 (two) times daily. 01/30/24   [provider]  triamcinolone  ointment (KENALOG ) 0.1 % Apply 1 Application topically 2 (two) times daily as needed. DO NOT use on the face. Apply a thin layer to affected skin. Patient taking differently: Apply 1 Application topically 2 (two) times daily as needed (rash). DO NOT use on the face. Apply a thin layer to affected skin. 02/17/24   Iva Marty Saltness, MD  zolpidem  (AMBIEN ) 10 MG tablet Take 10 mg by mouth at bedtime. 02/07/21   [provider]     Review of Systems  Positive ROS: neg  All other systems have been reviewed and were otherwise negative with the exception of those mentioned in the HPI and as above.  Objective: Vital signs in last 24 hours: Temp:  [98.7 F (37.1 C)] 98.7 F (37.1 C) (02/06 1144) Pulse Rate:  [60] 60 (02/06 1144) Resp:  [18] 18 (02/06 1144) BP: (111)/(71) 111/71 (02/06 1144) SpO2:  [97 %] 97 % (02/06 1144) Weight:  [74.8 kg] 74.8 kg (02/06 1144)  General Appearance: Alert, cooperative, no distress, appears stated age Head: Normocephalic, without obvious abnormality, atraumatic Eyes: PERRL, conjunctiva/corneas clear, EOM's intact      Neck: Supple, symmetrical, trachea midline, Back: Symmetric, no curvature, ROM normal, no CVA tenderness Lungs:  respirations unlabored Heart: Regular rate and rhythm Abdomen: Soft, non-tender Extremities: Extremities normal,  atraumatic, no cyanosis or edema Pulses: 2+ and symmetric all extremities Skin: Skin color, texture, turgor  normal, no rashes or lesions  NEUROLOGIC:  Mental status: Alert and oriented x4, no aphasia, good attention span, fund of knowledge and memory  Motor Exam - grossly normal except R hand weakness and atrophy Sensory Exam - grossly normal Reflexes: 1+ Coordination - grossly normal Gait - grossly normal Balance - grossly normal Cranial Nerves: I: smell Not tested  II: visual acuity  OS: nl    OD: nl  II: visual fields Full to confrontation  II: pupils Equal, round, reactive to light  III,VII: ptosis None  III,IV,VI: extraocular muscles  Full ROM  V: mastication Normal  V: facial light touch sensation  Normal  V,VII: corneal reflex  Present  VII: facial muscle function - upper  Normal  VII: facial muscle function - lower Normal  VIII: hearing Not tested  IX: soft palate elevation  Normal  IX,X: gag reflex Present  XI: trapezius strength  5/5  XI: sternocleidomastoid strength 5/5  XI: neck flexion strength  5/5  XII: tongue strength  Normal    Data Review Lab Results  Component Value Date   WBC 12.8 (H) 09/26/2024   HGB 13.7 09/26/2024   HCT 41.5 09/26/2024   MCV 87.7 09/26/2024   PLT 331 09/26/2024   Lab Results  Component Value Date   NA 142 07/19/2024   K 3.6 07/19/2024   CL 106 07/19/2024   CO2 25 07/19/2024   BUN 11 07/19/2024   CREATININE 1.20 (H) 07/19/2024   GLUCOSE 97 07/19/2024   Lab Results  Component Value Date   INR 1.0 03/16/2021    Assessment:   Cervical neck pain with herniated nucleus pulposus/ spondylosis/ stenosis at C7-T1. Estimated body mass index is 25.84 kg/m as calculated from the following:   Height as of this encounter: 5' 7 (1.702 m).   Weight as of this encounter: 74.8 kg.  Patient has failed conservative therapy. Planned surgery : posterior cervical decompression and instrumented fusion C7-T1  Plan:   I explained the  condition and procedure to the patient and answered any questions.  Patient wishes to proceed with procedure as planned. Understands risks/ benefits/ and expected or typical outcomes.  Alm GORMAN Molt 10/12/2024 11:57 AM      [1]  Allergies Allergen Reactions   Bee Venom Anaphylaxis   Cat Hair Extract Hives, Itching and Swelling    cat hair extract   Doxycycline  Nausea Only and Other (See Comments)    Due to Pre-Existing conditions involved with stomach, patient does not take the following medication  Other Reaction(s): Hives, Itching, Not available, Unknown   Dust Mite Extract Hives and Swelling   Erythromycin Nausea And Vomiting and Other (See Comments)    Due to Pre-Existing conditions involved with stomach, patient does not take the following medication  Other Reaction(s): Hives, Tongue swelling, Vomoting, Not available, Unknown  Other Reaction(s): Not available  E.E.S.   Ibuprofen  Nausea Only    gastroparesis  Other Reaction(s): Not available  gastroparesis gastroparesis    gastroparesis   Penicillins Anaphylaxis and Swelling    Has patient had a PCN reaction causing immediate rash, facial/tongue/throat swelling, SOB or lightheadedness with hypotension: Yes  Has patient had a PCN reaction causing severe rash involving mucus membranes or skin necrosis: Yes  Has patient had a PCN reaction that required hospitalization No  Has patient had a PCN reaction occurring within the last 10 years: No  If all of the above answers are NO, then may proceed with Cephalosporin use.  Other Reaction(s): Hives,  Tongue swelling, Vomiting, Unknown  Other reaction(s): Unknown Has patient had a PCN reaction causing immediate rash, facial/tongue/throat swelling, SOB or lightheadedness with hypotension: Yes Has patient had a PCN reaction causing severe rash involving mucus membranes or skin necrosis: Yes Has patient had a PCN reaction that required hospitalization No Has patient had a PCN  reaction occurring within the last 10 years: No If all of the above answers are NO, then may proceed with Cephalosporin use.    Has patient had a PCN reaction causing immediate rash, facial/tongue/throat swelling, SOB or lightheadedness with hypotension: Yes Has patient had a PCN reaction causing severe rash involving mucus membranes or skin necrosis: Yes Has patient had a PCN reaction that required hospitalization No Has patient had a PCN reaction occurring within the last 10 years: No If all of the above answers are NO, then may proceed with Cephalosporin use.   Cat Dander Hives, Itching and Swelling    SWELLING REACTION UNSPECIFIED    Morphine  Hives    Other Reaction(s): Itching, HIves, Not available   Tramadol Diarrhea, Nausea And Vomiting and Nausea Only    Other Reaction(s): Not available, Unknown  Other reaction(s): Unknown  Diarrhea   Dog Epithelium    Nsaids Other (See Comments)    Can not take d/t stomach issues  Other Reaction(s): Not available  Non-steroidal anti-inflammatory agent (substance)   Sulfamethoxazole-Trimethoprim     Pt states she was allergic as a child, but can take now  Other Reaction(s): Not available, Unknown  sulfamethoxazole / trimethoprim   Sulfa Antibiotics Hives and Itching    Pt states she can take now as an adult  Other Reaction(s): Hives, Tongue swelling, Vomiting

## 2024-10-12 NOTE — Transfer of Care (Signed)
 Immediate Anesthesia Transfer of Care Note  Patient: [name]  Procedure(s) Performed: POSTERIOR CERVICAL FUSION/FORAMINOTOMY LEVEL 1  Patient Location: PACU  Anesthesia Type:General  Level of Consciousness: awake, alert , and oriented  Airway & Oxygen Therapy: Patient Spontanous Breathing and Patient connected to nasal cannula oxygen  Post-op Assessment: Report given to RN and Post -op Vital signs reviewed and stable  Post vital signs: Reviewed and stable  Last Vitals:  Vitals Value Taken Time  BP 116/70 10/12/24 17:24  Temp 97.8 1728  Pulse 68 10/12/24 17:28  Resp 16 10/12/24 17:28  SpO2 93 % 10/12/24 17:28  Vitals shown include unfiled device data.  Last Pain:  Vitals:   10/12/24 1225  TempSrc:   PainSc: 0-No pain         Complications: No notable events documented.

## 2024-10-12 NOTE — Anesthesia Preprocedure Evaluation (Addendum)
"                                    Anesthesia Evaluation  Patient identified by MRN, date of birth, ID band Patient awake    Reviewed: Allergy  & Precautions, NPO status , Patient's Chart, lab work & pertinent test results  History of Anesthesia Complications (+) PONV and history of anesthetic complications  Airway Mallampati: II  TM Distance: >3 FB Neck ROM: Limited    Dental  (+) Edentulous Upper, Edentulous Lower   Pulmonary former smoker   breath sounds clear to auscultation       Cardiovascular hypertension, + CAD and +CHF   Rhythm:Regular Rate:Normal   TTE (2025): 1. Septal and inferior basal hypokinesis . Left ventricular ejection  fraction, by estimation, is 50 to 55%. The left ventricle has low normal  function. The left ventricle demonstrates regional wall motion  abnormalities (see scoring diagram/findings for  description). The left ventricular internal cavity size was mildly  dilated. Left ventricular diastolic parameters were normal. The average  left ventricular global longitudinal strain is -13.8 %. The global  longitudinal strain is abnormal.   2. Right ventricular systolic function is normal. The right ventricular  size is normal. There is normal pulmonary artery systolic pressure.   3. Left atrial size was mildly dilated.   4. The mitral valve is abnormal. Mild mitral valve regurgitation. No  evidence of mitral stenosis.   5. Mean gradient only 5 mmhg with DVI 0.71 mild stenosis by Area with low  LVOT diameter recorded. The aortic valve was not well visualized. There is  moderate calcification of the aortic valve. There is moderate thickening  of the aortic valve. Aortic  valve regurgitation is not visualized. Mild aortic valve stenosis.   6. The inferior vena cava is normal in size with greater than 50%  respiratory variability, suggesting right atrial pressure of 3 mmHg.     Neuro/Psych  Headaches PSYCHIATRIC DISORDERS Anxiety     CVA,  No Residual Symptoms    GI/Hepatic ,GERD  ,, Nondiabetic Gastroparesis     Endo/Other  Hypothyroidism   Addison's Disease   Renal/GU Renal disease     Musculoskeletal  (+) Arthritis ,    Abdominal   Peds  Hematology  (+) Blood dyscrasia, anemia   Anesthesia Other Findings   Reproductive/Obstetrics                              Anesthesia Physical Anesthesia Plan  ASA: 3  Anesthesia Plan: General   Post-op Pain Management:    Induction: Intravenous  PONV Risk Score and Plan: 2 and Ondansetron , Dexamethasone  and Treatment may vary due to age or medical condition  Airway Management Planned: Oral ETT  Additional Equipment: None  Intra-op Plan:   Post-operative Plan: Extubation in OR  Informed Consent: I have reviewed the patients History and Physical, chart, labs and discussed the procedure including the risks, benefits and alternatives for the proposed anesthesia with the patient or authorized representative who has indicated his/her understanding and acceptance.     Dental advisory given  Plan Discussed with: CRNA  Anesthesia Plan Comments: (Plan for preoperative Hydrocortisone  50 mg IV per Endocrinology note/recommendation.)         Anesthesia Quick Evaluation  "

## 2024-10-12 NOTE — Anesthesia Procedure Notes (Signed)
 Procedure Name: Intubation Date/Time: 10/12/2024 3:26 PM  Performed by: Scherrie Mast, CRNAPre-anesthesia Checklist: Patient identified, Emergency Drugs available, Suction available and Patient being monitored Patient Re-evaluated:Patient Re-evaluated prior to induction Oxygen Delivery Method: Circle System Utilized Preoxygenation: Pre-oxygenation with 100% oxygen Induction Type: IV induction Ventilation: Mask ventilation without difficulty Laryngoscope Size: Mac and 3 Grade View: Grade I Tube type: Oral Tube size: 7.0 mm Number of attempts: 1 Airway Equipment and Method: Stylet and Oral airway Placement Confirmation: ETT inserted through vocal cords under direct vision, positive ETCO2 and breath sounds checked- equal and bilateral Secured at: 21 cm Tube secured with: Tape Dental Injury: Teeth and Oropharynx as per pre-operative assessment

## 2024-10-12 NOTE — Op Note (Signed)
 10/12/2024  5:27 PM  PATIENT:  [name]  59 y.o. female  PRE-OPERATIVE DIAGNOSIS: C7-T1 spondylosis with foraminal stenosis and right C8 radiculopathy with weakness and atrophy in the right hand  POST-OPERATIVE DIAGNOSIS:  same  PROCEDURE:  1.  Bilateral C7-T1 foraminotomies with hemilaminectomies and medial facetectomies for decompression of the C8 nerve roots, 2.  Posterior cervical arthrodesis C7-T1 utilizing morselized allograft, 3.  Nonsegmental fixation C7-T1 utilizing Alphatec screws  SURGEON:  Alm Molt, MD  ASSISTANTS: None  ANESTHESIA:   General  EBL: Less than 50 ml  No intake/output data recorded.  BLOOD ADMINISTERED: none  DRAINS: none  SPECIMEN:  none  INDICATION FOR PROCEDURE: This patient presented with right arm pain in a C8 distribution with numbness and atrophy and weakness in the right hand. Imaging showed spondylosis C7-T1 nerve conduction studies showed a C8 radiculopathy.. The patient tried conservative measures without relief. Pain was debilitating. Recommended posterior cervical decompression C7-T1 with instrumented fusion. Patient understood the risks, benefits, and alternatives and potential outcomes and wished to proceed.  PROCEDURE DETAILS: The patient was brought to the operating room. Generalized endotracheal anesthesia was induced. The patient was affixed a 3 point Mayfield headrest and rolled into the prone position on chest rolls. All pressure points were padded. The posterior cervical region was cleaned and prepped with DuraPrep and then draped in the usual sterile fashion. 7 cc of local anesthesia was injected and a dorsal midline incision made in the posterior cervical region and carried down to the cervical fascia. The fascia was opened and the paraspinous musculature was taken down to expose C7-T1. Intraoperative fluoroscopy confirmed my level and then the dissection was carried out over the lateral facets. I localized the midpoint of each  lateral mass of C7 and marked a region 1 mm medial to the midpoint of the lateral mass, and then drilled in an upward and outward direction into the safe zone of each lateral mass. I drilled to a depth of 12 mm and then checked my drill hole with a ball probe. I then placed a 14 mm lateral mass screws into the safe zone of each lateral mass of C7 until they were 2 fingers tight.  I then performed a keyhole foraminotomies at C7-T1 bilaterally.  I then gently decompressed the foramina with the 1 and 2 mm Kerrison punch C7-T1. Medial facetectomies were performed, and foraminotomies were performed at at C7-T1.  I then used AP and lateral fluoroscopy to identify the entry zones along with surface landmarks, of the T1 pedicles.  I drilled into each pedicle to a depth of 22 mm, palpated with a ball probe and then placed a 24 mm pedicle screws into the T1 pedicles bilaterally.  I then decorticated the lateral masses and the facet joints and packed them with local autograft and morcellized allograft to perform arthrodesis from C7-T1. I then placed rods into the multiaxial screw heads of the screws and locked these into position with the locking caps and anti-torque device. I then checked the final construct with AP/Lat fluoroscopy. I irrigated with 3.5% povidone iodine solution followed by saline solution . I lined the dura with Gelfoam. After hemostasis was achieved I closed the muscle and the fascia with 0 Vicryl, subcutaneous tissue with 2-0 Vicryl, and the subcuticular tissue with 3-0 Vicryl. The skin was closed with benzoin and Steri-Strips. A sterile dressing was applied, the patient was turned to the supine position and taken out of the headrest, awakened from general anesthesia and transferred  to the recovery room in stable condition. At the end of the procedure all sponge, needle and instrument counts were correct.    PLAN OF CARE: Admit to inpatient   PATIENT DISPOSITION:  PACU - hemodynamically stable.    Delay start of Pharmacological VTE agent (>24hrs) due to surgical blood loss or risk of bleeding:  yes

## 2024-10-29 ENCOUNTER — Ambulatory Visit: Admitting: Obstetrics & Gynecology
